# Patient Record
Sex: Female | Born: 1940 | Race: Black or African American | Hispanic: No | Smoking: Former smoker
Health system: Southern US, Community
[De-identification: ages and names within clinical notes are randomized; demographics above are authoritative.]

## PROBLEM LIST (undated history)

## (undated) DIAGNOSIS — Z86718 Personal history of other venous thrombosis and embolism: Secondary | ICD-10-CM

## (undated) DIAGNOSIS — F329 Major depressive disorder, single episode, unspecified: Secondary | ICD-10-CM

## (undated) DIAGNOSIS — M419 Scoliosis, unspecified: Secondary | ICD-10-CM

## (undated) DIAGNOSIS — E785 Hyperlipidemia, unspecified: Secondary | ICD-10-CM

## (undated) DIAGNOSIS — F32A Depression, unspecified: Secondary | ICD-10-CM

## (undated) DIAGNOSIS — M549 Dorsalgia, unspecified: Secondary | ICD-10-CM

## (undated) DIAGNOSIS — Z86711 Personal history of pulmonary embolism: Secondary | ICD-10-CM

## (undated) DIAGNOSIS — G4730 Sleep apnea, unspecified: Secondary | ICD-10-CM

## (undated) DIAGNOSIS — Z8673 Personal history of transient ischemic attack (TIA), and cerebral infarction without residual deficits: Secondary | ICD-10-CM

## (undated) DIAGNOSIS — I639 Cerebral infarction, unspecified: Secondary | ICD-10-CM

## (undated) DIAGNOSIS — J45909 Unspecified asthma, uncomplicated: Secondary | ICD-10-CM

## (undated) DIAGNOSIS — G8929 Other chronic pain: Secondary | ICD-10-CM

## (undated) DIAGNOSIS — M1990 Unspecified osteoarthritis, unspecified site: Secondary | ICD-10-CM

## (undated) DIAGNOSIS — I10 Essential (primary) hypertension: Secondary | ICD-10-CM

## (undated) HISTORY — DX: Personal history of other venous thrombosis and embolism: Z86.718

## (undated) HISTORY — DX: Hyperlipidemia, unspecified: E78.5

## (undated) HISTORY — DX: Personal history of pulmonary embolism: Z86.711

## (undated) HISTORY — DX: Scoliosis, unspecified: M41.9

## (undated) HISTORY — DX: Personal history of transient ischemic attack (TIA), and cerebral infarction without residual deficits: Z86.73

## (undated) NOTE — *Deleted (*Deleted)
Speech Language Pathology Daily Session Note  Patient Details  Name: [name] MRN: [id number] Date of Birth: Dec 30, 1940  {chl ip rehab slp time calculations:[id number]}  Short Term Goals: Week 2: SLP Short Term Goal 1 (Week 2): Pt will utilize external memory aids to facilitate recall of daily information with min assist. SLP Short Term Goal 2 (Week 2): Pt will sustain attention to functional tasks with Min A cues for redirection. SLP Short Term Goal 3 (Week 2): Pt will demonstrate ability to problem solve basic functional situations with Mod A verbal/visual cues. SLP Short Term Goal 4 (Week 2): Pt will initiate in basic tasks with Mod A multimodal cues.  Skilled Therapeutic Interventions: Pt was seen for skilled ST targeting       Pain    Therapy/Group: Individual Therapy  Little Ishikawa 05/24/2020, 7:24 AM

---

## 1968-08-12 HISTORY — PX: OOPHORECTOMY: SHX86

## 1981-08-12 HISTORY — PX: TUBAL LIGATION: SHX77

## 1997-12-28 ENCOUNTER — Ambulatory Visit (HOSPITAL_COMMUNITY): Admission: RE | Admit: 1997-12-28 | Discharge: 1997-12-28 | Payer: Self-pay | Admitting: Obstetrics

## 1998-02-08 ENCOUNTER — Ambulatory Visit (HOSPITAL_COMMUNITY): Admission: RE | Admit: 1998-02-08 | Discharge: 1998-02-08 | Payer: Self-pay | Admitting: *Deleted

## 1998-03-27 ENCOUNTER — Encounter: Payer: Self-pay | Admitting: *Deleted

## 1998-03-27 ENCOUNTER — Ambulatory Visit (HOSPITAL_COMMUNITY): Admission: RE | Admit: 1998-03-27 | Discharge: 1998-03-27 | Payer: Self-pay | Admitting: *Deleted

## 1998-05-24 ENCOUNTER — Ambulatory Visit (HOSPITAL_COMMUNITY): Admission: RE | Admit: 1998-05-24 | Discharge: 1998-05-24 | Payer: Self-pay | Admitting: *Deleted

## 1998-08-18 ENCOUNTER — Ambulatory Visit (HOSPITAL_COMMUNITY): Admission: RE | Admit: 1998-08-18 | Discharge: 1998-08-18 | Payer: Self-pay | Admitting: *Deleted

## 1998-08-18 ENCOUNTER — Encounter: Payer: Self-pay | Admitting: *Deleted

## 1998-09-13 ENCOUNTER — Ambulatory Visit (HOSPITAL_COMMUNITY): Admission: RE | Admit: 1998-09-13 | Discharge: 1998-09-13 | Payer: Self-pay | Admitting: *Deleted

## 1998-09-29 ENCOUNTER — Ambulatory Visit (HOSPITAL_COMMUNITY): Admission: RE | Admit: 1998-09-29 | Discharge: 1998-09-29 | Payer: Self-pay | Admitting: Urology

## 1998-09-29 ENCOUNTER — Encounter: Payer: Self-pay | Admitting: Urology

## 1998-10-13 ENCOUNTER — Ambulatory Visit (HOSPITAL_BASED_OUTPATIENT_CLINIC_OR_DEPARTMENT_OTHER): Admission: RE | Admit: 1998-10-13 | Discharge: 1998-10-13 | Payer: Self-pay | Admitting: Urology

## 2000-07-19 ENCOUNTER — Inpatient Hospital Stay (HOSPITAL_COMMUNITY): Admission: EM | Admit: 2000-07-19 | Discharge: 2000-07-23 | Payer: Self-pay | Admitting: *Deleted

## 2000-07-19 ENCOUNTER — Encounter: Payer: Self-pay | Admitting: *Deleted

## 2000-07-21 ENCOUNTER — Encounter: Payer: Self-pay | Admitting: Neurology

## 2000-12-22 ENCOUNTER — Encounter: Admission: RE | Admit: 2000-12-22 | Discharge: 2000-12-22 | Payer: Self-pay | Admitting: Sports Medicine

## 2000-12-22 ENCOUNTER — Encounter: Payer: Self-pay | Admitting: Sports Medicine

## 2001-02-20 ENCOUNTER — Encounter: Admission: RE | Admit: 2001-02-20 | Discharge: 2001-05-21 | Payer: Self-pay | Admitting: Sports Medicine

## 2001-06-14 ENCOUNTER — Encounter: Payer: Self-pay | Admitting: *Deleted

## 2001-06-14 ENCOUNTER — Emergency Department (HOSPITAL_COMMUNITY): Admission: EM | Admit: 2001-06-14 | Discharge: 2001-06-14 | Payer: Self-pay | Admitting: *Deleted

## 2002-02-25 ENCOUNTER — Encounter: Payer: Self-pay | Admitting: *Deleted

## 2002-02-25 ENCOUNTER — Ambulatory Visit (HOSPITAL_COMMUNITY): Admission: RE | Admit: 2002-02-25 | Discharge: 2002-02-25 | Payer: Self-pay | Admitting: *Deleted

## 2002-05-24 ENCOUNTER — Encounter: Payer: Self-pay | Admitting: *Deleted

## 2002-05-24 ENCOUNTER — Encounter: Admission: RE | Admit: 2002-05-24 | Discharge: 2002-05-24 | Payer: Self-pay | Admitting: *Deleted

## 2003-04-05 ENCOUNTER — Encounter: Payer: Self-pay | Admitting: *Deleted

## 2003-04-05 ENCOUNTER — Ambulatory Visit (HOSPITAL_COMMUNITY): Admission: RE | Admit: 2003-04-05 | Discharge: 2003-04-05 | Payer: Self-pay | Admitting: *Deleted

## 2003-11-29 ENCOUNTER — Ambulatory Visit (HOSPITAL_COMMUNITY): Admission: RE | Admit: 2003-11-29 | Discharge: 2003-11-29 | Payer: Self-pay | Admitting: Sports Medicine

## 2004-02-03 HISTORY — PX: ARTHROSCOPIC REPAIR ACL: SUR80

## 2004-04-09 ENCOUNTER — Encounter: Admission: RE | Admit: 2004-04-09 | Discharge: 2004-04-09 | Payer: Self-pay | Admitting: Orthopedic Surgery

## 2004-07-06 ENCOUNTER — Encounter: Admission: RE | Admit: 2004-07-06 | Discharge: 2004-07-06 | Payer: Self-pay | Admitting: Internal Medicine

## 2004-09-06 ENCOUNTER — Encounter: Admission: RE | Admit: 2004-09-06 | Discharge: 2004-10-10 | Payer: Self-pay | Admitting: Internal Medicine

## 2004-10-04 ENCOUNTER — Encounter: Admission: RE | Admit: 2004-10-04 | Discharge: 2004-10-04 | Payer: Self-pay | Admitting: Orthopedic Surgery

## 2004-11-20 ENCOUNTER — Encounter: Admission: RE | Admit: 2004-11-20 | Discharge: 2004-11-20 | Payer: Self-pay | Admitting: Orthopedic Surgery

## 2004-11-27 ENCOUNTER — Encounter: Admission: RE | Admit: 2004-11-27 | Discharge: 2005-02-25 | Payer: Self-pay | Admitting: Internal Medicine

## 2005-03-04 ENCOUNTER — Encounter: Admission: RE | Admit: 2005-03-04 | Discharge: 2005-03-04 | Payer: Self-pay | Admitting: Orthopedic Surgery

## 2005-03-25 ENCOUNTER — Encounter: Admission: RE | Admit: 2005-03-25 | Discharge: 2005-03-25 | Payer: Self-pay | Admitting: Orthopedic Surgery

## 2005-04-09 ENCOUNTER — Encounter: Admission: RE | Admit: 2005-04-09 | Discharge: 2005-04-09 | Payer: Self-pay | Admitting: Orthopedic Surgery

## 2005-04-26 ENCOUNTER — Encounter: Admission: RE | Admit: 2005-04-26 | Discharge: 2005-04-26 | Payer: Self-pay | Admitting: Orthopedic Surgery

## 2005-08-21 ENCOUNTER — Encounter: Admission: RE | Admit: 2005-08-21 | Discharge: 2005-08-21 | Payer: Self-pay | Admitting: Internal Medicine

## 2005-11-05 ENCOUNTER — Encounter: Admission: RE | Admit: 2005-11-05 | Discharge: 2005-11-05 | Payer: Self-pay | Admitting: Orthopedic Surgery

## 2005-11-26 ENCOUNTER — Encounter: Admission: RE | Admit: 2005-11-26 | Discharge: 2005-11-26 | Payer: Self-pay | Admitting: Orthopedic Surgery

## 2005-12-10 ENCOUNTER — Encounter: Admission: RE | Admit: 2005-12-10 | Discharge: 2005-12-10 | Payer: Self-pay | Admitting: Orthopedic Surgery

## 2006-01-01 ENCOUNTER — Ambulatory Visit (HOSPITAL_COMMUNITY): Admission: RE | Admit: 2006-01-01 | Discharge: 2006-01-01 | Payer: Self-pay | Admitting: Neurological Surgery

## 2006-03-31 ENCOUNTER — Inpatient Hospital Stay (HOSPITAL_COMMUNITY): Admission: RE | Admit: 2006-03-31 | Discharge: 2006-04-04 | Payer: Self-pay | Admitting: Neurological Surgery

## 2006-03-31 HISTORY — PX: SPINE SURGERY: SHX786

## 2006-04-04 ENCOUNTER — Inpatient Hospital Stay (HOSPITAL_COMMUNITY)
Admission: RE | Admit: 2006-04-04 | Discharge: 2006-04-15 | Payer: Self-pay | Admitting: Physical Medicine & Rehabilitation

## 2006-04-04 ENCOUNTER — Ambulatory Visit: Payer: Self-pay | Admitting: Physical Medicine & Rehabilitation

## 2007-01-09 ENCOUNTER — Encounter: Admission: RE | Admit: 2007-01-09 | Discharge: 2007-01-09 | Payer: Self-pay | Admitting: Internal Medicine

## 2008-03-01 ENCOUNTER — Encounter: Admission: RE | Admit: 2008-03-01 | Discharge: 2008-03-01 | Payer: Self-pay | Admitting: Internal Medicine

## 2008-06-30 ENCOUNTER — Emergency Department (HOSPITAL_COMMUNITY): Admission: EM | Admit: 2008-06-30 | Discharge: 2008-06-30 | Payer: Self-pay | Admitting: Emergency Medicine

## 2008-10-14 ENCOUNTER — Encounter
Admission: RE | Admit: 2008-10-14 | Discharge: 2009-01-12 | Payer: Self-pay | Admitting: Physical Medicine & Rehabilitation

## 2008-10-17 ENCOUNTER — Ambulatory Visit: Payer: Self-pay | Admitting: Physical Medicine & Rehabilitation

## 2008-11-24 ENCOUNTER — Ambulatory Visit: Payer: Self-pay | Admitting: Physical Medicine & Rehabilitation

## 2008-12-22 ENCOUNTER — Ambulatory Visit: Payer: Self-pay | Admitting: Physical Medicine & Rehabilitation

## 2009-01-26 ENCOUNTER — Encounter
Admission: RE | Admit: 2009-01-26 | Discharge: 2009-02-14 | Payer: Self-pay | Admitting: Physical Medicine & Rehabilitation

## 2009-01-30 ENCOUNTER — Ambulatory Visit: Payer: Self-pay | Admitting: Physical Medicine & Rehabilitation

## 2009-02-14 ENCOUNTER — Ambulatory Visit: Payer: Self-pay | Admitting: Physical Medicine & Rehabilitation

## 2009-04-18 ENCOUNTER — Ambulatory Visit: Payer: Self-pay | Admitting: Physical Medicine & Rehabilitation

## 2009-04-18 ENCOUNTER — Encounter
Admission: RE | Admit: 2009-04-18 | Discharge: 2009-06-30 | Payer: Self-pay | Admitting: Physical Medicine & Rehabilitation

## 2009-05-03 ENCOUNTER — Encounter: Admission: RE | Admit: 2009-05-03 | Discharge: 2009-05-03 | Payer: Self-pay | Admitting: Internal Medicine

## 2009-05-17 ENCOUNTER — Ambulatory Visit: Payer: Self-pay | Admitting: Physical Medicine & Rehabilitation

## 2009-06-16 ENCOUNTER — Ambulatory Visit: Payer: Self-pay | Admitting: Physical Medicine & Rehabilitation

## 2009-06-30 ENCOUNTER — Encounter
Admission: RE | Admit: 2009-06-30 | Discharge: 2009-08-10 | Payer: Self-pay | Admitting: Physical Medicine & Rehabilitation

## 2009-07-12 ENCOUNTER — Ambulatory Visit: Payer: Self-pay | Admitting: Physical Medicine & Rehabilitation

## 2009-08-10 ENCOUNTER — Ambulatory Visit: Payer: Self-pay | Admitting: Physical Medicine & Rehabilitation

## 2009-09-05 ENCOUNTER — Encounter
Admission: RE | Admit: 2009-09-05 | Discharge: 2009-12-04 | Payer: Self-pay | Admitting: Physical Medicine & Rehabilitation

## 2009-09-13 ENCOUNTER — Ambulatory Visit: Payer: Self-pay | Admitting: Physical Medicine & Rehabilitation

## 2009-10-11 ENCOUNTER — Ambulatory Visit: Payer: Self-pay | Admitting: Physical Medicine & Rehabilitation

## 2009-11-28 ENCOUNTER — Ambulatory Visit: Payer: Self-pay | Admitting: Physical Medicine & Rehabilitation

## 2009-12-20 ENCOUNTER — Encounter
Admission: RE | Admit: 2009-12-20 | Discharge: 2010-03-20 | Payer: Self-pay | Source: Home / Self Care | Admitting: Physical Medicine & Rehabilitation

## 2009-12-27 ENCOUNTER — Ambulatory Visit: Payer: Self-pay | Admitting: Physical Medicine & Rehabilitation

## 2010-01-24 ENCOUNTER — Ambulatory Visit: Payer: Self-pay | Admitting: Physical Medicine & Rehabilitation

## 2010-03-05 ENCOUNTER — Ambulatory Visit: Payer: Self-pay | Admitting: Physical Medicine & Rehabilitation

## 2010-03-28 ENCOUNTER — Encounter
Admission: RE | Admit: 2010-03-28 | Discharge: 2010-06-26 | Payer: Self-pay | Admitting: Physical Medicine & Rehabilitation

## 2010-04-04 ENCOUNTER — Ambulatory Visit: Payer: Self-pay | Admitting: Physical Medicine & Rehabilitation

## 2010-05-02 ENCOUNTER — Ambulatory Visit: Payer: Self-pay | Admitting: Physical Medicine & Rehabilitation

## 2010-05-29 ENCOUNTER — Ambulatory Visit: Payer: Self-pay | Admitting: Physical Medicine & Rehabilitation

## 2010-05-29 ENCOUNTER — Ambulatory Visit (HOSPITAL_COMMUNITY)
Admission: RE | Admit: 2010-05-29 | Discharge: 2010-05-29 | Payer: Self-pay | Admitting: Physical Medicine & Rehabilitation

## 2010-06-15 ENCOUNTER — Encounter: Admission: RE | Admit: 2010-06-15 | Discharge: 2010-06-15 | Payer: Self-pay | Admitting: Internal Medicine

## 2010-06-28 ENCOUNTER — Encounter
Admission: RE | Admit: 2010-06-28 | Discharge: 2010-08-07 | Payer: Self-pay | Source: Home / Self Care | Attending: Physical Medicine & Rehabilitation | Admitting: Physical Medicine & Rehabilitation

## 2010-07-03 ENCOUNTER — Ambulatory Visit: Payer: Self-pay | Admitting: Physical Medicine & Rehabilitation

## 2010-08-01 ENCOUNTER — Ambulatory Visit: Payer: Self-pay | Admitting: Physical Medicine & Rehabilitation

## 2010-08-08 ENCOUNTER — Ambulatory Visit: Payer: Self-pay | Admitting: Physical Medicine & Rehabilitation

## 2010-09-04 ENCOUNTER — Encounter
Admission: RE | Admit: 2010-09-04 | Discharge: 2010-09-05 | Payer: Self-pay | Source: Home / Self Care | Attending: Physical Medicine & Rehabilitation | Admitting: Physical Medicine & Rehabilitation

## 2010-09-05 ENCOUNTER — Ambulatory Visit
Admission: RE | Admit: 2010-09-05 | Discharge: 2010-09-05 | Payer: Self-pay | Source: Home / Self Care | Attending: Physical Medicine & Rehabilitation | Admitting: Physical Medicine & Rehabilitation

## 2010-10-02 ENCOUNTER — Ambulatory Visit: Payer: Medicare Other | Admitting: Physical Medicine & Rehabilitation

## 2010-10-02 ENCOUNTER — Encounter: Payer: Medicare Other | Attending: Physical Medicine & Rehabilitation

## 2010-10-02 DIAGNOSIS — M4180 Other forms of scoliosis, site unspecified: Secondary | ICD-10-CM | POA: Insufficient documentation

## 2010-10-02 DIAGNOSIS — M961 Postlaminectomy syndrome, not elsewhere classified: Secondary | ICD-10-CM

## 2010-10-02 DIAGNOSIS — M4120 Other idiopathic scoliosis, site unspecified: Secondary | ICD-10-CM

## 2010-10-31 ENCOUNTER — Ambulatory Visit: Payer: Medicare Other

## 2010-10-31 ENCOUNTER — Encounter: Payer: Medicare Other | Attending: Physical Medicine & Rehabilitation

## 2010-10-31 DIAGNOSIS — M961 Postlaminectomy syndrome, not elsewhere classified: Secondary | ICD-10-CM | POA: Insufficient documentation

## 2010-10-31 DIAGNOSIS — M4120 Other idiopathic scoliosis, site unspecified: Secondary | ICD-10-CM | POA: Insufficient documentation

## 2010-10-31 DIAGNOSIS — M48061 Spinal stenosis, lumbar region without neurogenic claudication: Secondary | ICD-10-CM | POA: Insufficient documentation

## 2010-10-31 DIAGNOSIS — M533 Sacrococcygeal disorders, not elsewhere classified: Secondary | ICD-10-CM

## 2010-11-02 NOTE — Assessment & Plan Note (Signed)
REASON FOR VISIT:  Back pain.  HISTORY:  A 70 year old female with lumbar postlaminectomy syndrome. She has problems with chronic low back pain.  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  Her pain is well managed with a combination of the following medications, fentanyl patch 25 mcg q.48 h. and hydrocodone 5/325 one p.o. b.i.d. or t.i.d.  Last nursing visit on September 05, 2010, last MD visit on July 03, 2010.  INTERVAL MEDICAL HISTORY:  Negative.  REVIEW OF SYSTEMS:  Denies falls.  No lower extremity pain.  No shooting pains.  Blood pressure 132/43, pulse 52, respirations 18, sat 96% on room air. General, no acute stress.  Mood and affect appropriate.  Her back has no tenderness to palpation except at the right costovertebral angle.  Lower extremity have good strength.  She has 1+ edema bilateral lower extremity pretibial.  Straight leg raising test is negative.  IMPRESSION:  Lumbar postlaminectomy syndrome.  No radicular pain.  PLAN:  Continue current medications listed above.  I will convert her to nurse practitioner visits to be seen in 1 months, nursing visit in 1 month.     Erick Colace, M.D. Electronically Signed    AEK/MedQ D:  10/02/2010 16:23:18  T:  10/02/2010 21:14:21  Job #:  [id number]

## 2010-12-17 ENCOUNTER — Emergency Department (HOSPITAL_COMMUNITY): Payer: Medicare Other

## 2010-12-17 ENCOUNTER — Emergency Department (HOSPITAL_COMMUNITY)
Admission: EM | Admit: 2010-12-17 | Discharge: 2010-12-17 | Disposition: A | Discharge status: Home / Self Care | Payer: Medicare Other | Attending: Emergency Medicine | Admitting: Emergency Medicine

## 2010-12-17 ENCOUNTER — Encounter: Payer: Medicare Other | Admitting: Neurosurgery

## 2010-12-17 DIAGNOSIS — M25569 Pain in unspecified knee: Secondary | ICD-10-CM | POA: Insufficient documentation

## 2010-12-17 DIAGNOSIS — M79609 Pain in unspecified limb: Secondary | ICD-10-CM

## 2010-12-17 DIAGNOSIS — E7800 Pure hypercholesterolemia, unspecified: Secondary | ICD-10-CM | POA: Insufficient documentation

## 2010-12-17 DIAGNOSIS — M7120 Synovial cyst of popliteal space [Baker], unspecified knee: Secondary | ICD-10-CM | POA: Insufficient documentation

## 2010-12-17 DIAGNOSIS — M1710 Unilateral primary osteoarthritis, unspecified knee: Secondary | ICD-10-CM | POA: Insufficient documentation

## 2010-12-25 NOTE — Procedures (Signed)
NAME:  [name], [name] NO.:  [id number]   MEDICAL RECORD NO.:  [id number]           PATIENT TYPE:   LOCATION:                                 FACILITY:   PHYSICIAN:  Erick Colace, M.D.DATE OF BIRTH:  05/08/1941   DATE OF PROCEDURE:  12/22/2008  DATE OF DISCHARGE:                               OPERATIVE REPORT   This is bilateral sacroiliac injections.   INDICATION:  Sacroiliac distribution pain.  She had relief going from 8-  10 pain to 0/10 pain last month, but duration was only about a month,  therefore, repeating.   Informed consent was obtained after describing risks and benefits of  procedure with the patient.  These include bleeding, bruising,  infection.  She elects to proceed and has given written consent.  Pain  does interfere with self-care mobility.  This is rated at a 7/10 level.   The patient placed prone on fluoroscopy table.  Betadine prep, sterile  drape.  A 25-gauge 1-1/2-inch needle was used to anesthetize the skin  and subcu tissue 1% lidocaine x2 mL at each site.  Then, a 25-gauge 3-  inch spinal needle was inserted under fluoroscopic guidance first  targeting the right SI joint.  AP, lateral, and oblique images utilized.  Omnipaque 180 under live fluoro x0.5 mL demonstrate no intravascular  uptake, good joint arthrogram.  Followed by injection of 0.5 mL of 40  mg/mL of Depo-Medrol and 1 mL of 2% MPF lidocaine.  Same procedure  repeated on the right side using the same needle, injectate, and  technique.  The patient tolerated the procedure well.  Pre- and post-  injection vitals stable.  Post-injection instructions given.  Pre-  injection pain level 9/10.  Post injection 6/10 given less than 50%  relief.  We will proceed with caudal epidural given that she does have  some lower extremity radicular discomfort as well.      Erick Colace, M.D.  Electronically Signed     AEK/MEDQ  D:  12/22/2008 09:57:13  T:  12/22/2008  22:46:50  Job:  [id number]   cc:   Stefani Dama, M.D.  Fax: [phone]

## 2010-12-25 NOTE — Procedures (Signed)
NAME:  [name], [name] NO.:  [id number]   MEDICAL RECORD NO.:  [id number]           PATIENT TYPE:   LOCATION:                                 FACILITY:   PHYSICIAN:  Erick Colace, M.D.DATE OF BIRTH:  1941-01-18   DATE OF PROCEDURE:  11/24/2008  DATE OF DISCHARGE:                               OPERATIVE REPORT   PROCEDURE:  Bilateral sacroiliac injection under fluoroscopic guidance.   INDICATIONS:  Back pain post-laminectomy syndrome, history of fusion.  The pain is only partially responsive to medication management and other  conservative care.  She has pedicle screws from L1 through S1.   Informed consent was obtained after describing risks and benefits of  procedure with the patient.  These include bleeding, bruising, and  infection.  She elects to proceed and has given written consent.   The patient placed prone on fluoroscopy table.  Betadine prep, sterile  drape, a 25-gauge 1-1/2-inch needle was used to anesthetize the skin and  subcu tissue 1% lidocaine x2 mL.  Then, a 25-gauge 3-inch spinal needle  was inserted under fluoroscopic guidance targeting the left SI joint.  AP, lateral, and oblique images utilized.  Omnipaque 180 x0.5 mL  demonstrated no intravascular uptake, good arthrogram followed by  injection of 0.5 mL of 40 mg/mL of Depo-Medrol, 1 mL of 2% MPF  lidocaine.  The same procedure was repeated on the right side using same  needle injectate and technique.  The patient tolerated the procedure  well.  Pre- and post-injection vitals stable.  Post-injection  instructions given.   Pre injection pain level is 8/10.  Post-injection pain level is 0/10.  We will follow up 1 month possible reinjection if pain recurs by that  time.      Erick Colace, M.D.  Electronically Signed     AEK/MEDQ  D:  11/24/2008 10:47:32  T:  11/25/2008 01:59:51  Job:  [id number]   cc:   Stefani Dama, M.D.  Fax: [phone]

## 2010-12-25 NOTE — Assessment & Plan Note (Signed)
Referral from Dr. Danielle Dess for the evaluation of chronic low back pain.   A 70 year old female with degenerative lumbar scoliosis and stenosis.  She underwent surgical decompression of her lumbar spine in August 2007.  She has had chronic pain responsive to hydrocodone but having reduction  in terms of duration of effect of the short-acting medication.  She uses  lumbar corset.  She has been on hydrocodone 7.5/325 q.i.d.   The patient's main complaint is back pain described as sharp and aching,  currently as 0, but averaging 8, interferes with activity at 8/10 level.  Sleep is fair.  Pain is worse with walking, improves with rest and  medication, relief from meds is good.  She could walk 10 minutes at a  time.  She does not climb steps.  She does drive.  She uses cane.  She  has been on disability since 1990 per her report.  She needs assistance,  meal prep, household duties, and shopping.   REVIEW OF SYSTEMS:  Positive trouble walking, anxiety.   PAST SURGICAL HISTORY:  Tubal ligation 1983, ovariectomy 1970 on the  right, arthroscopy right knee 2005.   SOCIAL HISTORY:  Married, lives with her husband.  Denies any drug or  alcohol use.  No smoking.   FAMILY HISTORY:  Diabetes, high blood pressure.   MEDICATIONS:  1. Aspirin 81 mg a day.  2. Arthrotec 75 b.i.d.  3. Vicodin 7.5/325 q.i.d.  4. Aciphex 20 mg per day.  5. Potassium 10 mEq p.o. daily.  6. Fosamax 70 mg p.o. q. week.  7. Allegra 180 mg p.o. daily.  8. Lipitor 20 mg daily.  9. Amlodipine 10 mg p.o. daily.  10.Proventil 2 puffs p.r.n.  11.Xanax 0.5 daily.  12.Vitamin C 1000 mg daily.  13.Centrum Silver daily.   Oswestry disability score of 64%.   Opioid risk tool 1, which puts her at a low risk for opioid misuse.   ALLERGIES:  To CODEINE causing itching.   PHYSICAL EXAMINATION:  GENERAL:  Obese female in no acute stress.  Orientation x3.  Affect alert.  Gait is with a limp.  EXTREMITIES:  Without edema.   Coordination normal in the upper and lower  extremities.  Deep tendon reflexes are reduced.  Bilateral lower  extremities 1+ at the knees and ankles, 2+ at the biceps, triceps,  brachioradialis.  Sensation is intact.  Her lumbar spine range of motion  is 0 to 25% of extension, and goes to 50% flexion.  Upper and lower  extremity strength is 5/5.   IMPRESSION:  Lumbar post laminectomy syndrome with chronic postoperative  pain.  Her pain is mainly axial, may be referable to her lumbar facet  versus multifactorial.   PLAN:  We will check urine drug screen today.  She has 55 hydrocodone  left which should last her until the point we get the results back, we  could call in some additional, at least to get her started and then  consider switching over to MS Contin 15 mg b.i.d.  If everything checks  out on the UDS, would recommend aquatic therapy for overall conditioning  and enhancing mobility and function.   He may benefit from facet medial branch blocks although the patient is  somewhat reluctant about considering this option at this point.   Give her some further information on that.      Erick Colace, M.D.  Electronically Signed     AEK/MedQ  D:  10/17/2008 17:06:57  T:  10/18/2008  04:37:01  Job #:  [id number]   cc:   Stefani Dama, M.D.  Fax: [phone]

## 2010-12-25 NOTE — Procedures (Signed)
NAME:  [name], [name] NO.:  [id number]   MEDICAL RECORD NO.:  [id number]          PATIENT TYPE:  REC   LOCATION:  [facility]                          FACILITY:  [facility]   PHYSICIAN:  Erick Colace, M.D.DATE OF BIRTH:  [birth year]   DATE OF PROCEDURE:  DATE OF DISCHARGE:                               OPERATIVE REPORT   PROCEDURE:  This is a caudal epidural steroid injection under  fluoroscopic guidance.   INDICATIONS:  Radiculitis, lumbar post laminectomy syndrome.   Informed consent was obtained after describing risks and benefits of the  procedure with the patient.  These include bleeding, bruising,  infection, temporary permanent paralysis.  She elects proceed and has  given written consent.  Her pain is only partially responsive to  medication management and interferes with self-care mobility.   The patient was placed prone on fluoroscopy table.  Betadine prep,  sterile drape, 25-gauge inch and a half needle was used to anesthetize  the skin and subcu tissue.  1% lidocaine x5 mL, then a 22-gauge 3-1/2-  inch spinal needle was inserted under fluoroscopic guidance into the  sacral hiatus.  AP and lateral images utilized.  Omnipaque 180  demonstrated good spread of contrast material cephalad toward S1  followed by injection of 2 mL of 40 mg/mL Depo-Medrol and 2 mL of 1% MPF  lidocaine.  The patient tolerated the procedure well.  Pre and post  injection vitals stable.  Post-injection instructions given.  I will see  her back for repeat injection when this wears off.  I will see her back  in followup first.      Erick Colace, M.D.  Electronically Signed     AEK/MEDQ  D:  01/30/2009 10:10:51  T:  01/31/2009 00:27:31  Job:  [id number]   cc:   Stefani Dama, M.D.  Fax: [phone]

## 2010-12-27 ENCOUNTER — Encounter: Payer: Medicare Other | Attending: Physical Medicine & Rehabilitation

## 2010-12-27 ENCOUNTER — Ambulatory Visit: Payer: Medicare Other | Admitting: Physical Medicine & Rehabilitation

## 2010-12-27 DIAGNOSIS — M4120 Other idiopathic scoliosis, site unspecified: Secondary | ICD-10-CM | POA: Insufficient documentation

## 2010-12-27 DIAGNOSIS — M961 Postlaminectomy syndrome, not elsewhere classified: Secondary | ICD-10-CM | POA: Insufficient documentation

## 2010-12-27 DIAGNOSIS — M48061 Spinal stenosis, lumbar region without neurogenic claudication: Secondary | ICD-10-CM | POA: Insufficient documentation

## 2010-12-27 DIAGNOSIS — M7120 Synovial cyst of popliteal space [Baker], unspecified knee: Secondary | ICD-10-CM

## 2010-12-28 NOTE — Assessment & Plan Note (Signed)
Ms. Lizaola returns today.  I last saw her in June when I did a caudal  epidural steroid injection.  Her pain went from 6/10 to 0/10, but this  only lasted few days.  Prior to that she also had a good relief from  sacroiliac injection bilaterally on November 24, 2008 and once again on Dec 22, 2008, once again a short-term affect.  Her main complaint is that  she has some activity related pain.  She states that she took seven  Vicodin from her son that he had from a postoperative prescription.  I  cautioned her that this is not only a legal, but violates her controlled  substance agreement.  She understands this and states she would no  longer to this.   Her pain is worse with walking, bending, standing.  Relief from meds is  good.  She can walk 10 minutes at time.  She drives.  She does not climb  steps.  She needs help with meal prep, household duties, and shopping,  but otherwise is independent.  Her review of systems positive for  trouble walking and anxiety.   PHYSICAL EXAMINATION:  VITAL SIGNS:  Her blood pressure is 139/73, pulse  63, respirations 18, O2 sat 97% on room air.  GENERAL:  No acute stress.  Orientation x3.  Affect alert.  Gait is with  a cane.  She has no problems with getting on and off of the office chair  or the exam table with the assistance of a small step stool.  Her back  has tenderness to palpation mainly in the PSIS area bilaterally right  greater than left side.  Her hip range of motion is good.  Lower  extremity strength is good.  Deep tendon reflexes are 1+ bilateral lower  extremities and sensation is mildly reduced.   IMPRESSION:  1. Lumbar post laminectomy syndrome.  2. Sacroiliac disorder.   PLAN:  1. We will add hydrocodone 5/501 p.o. daily for breakthrough type      pain.  2. Continue fentanyl patch 25 mcg q.48 h.  3. Urine drug screen next visit.  Nursing to see her 4 weeks.  I will      see her in 3 months.      Erick Colace, M.D.  Electronically Signed     AEK/MedQ  D:  04/18/2009 11:37:08  T:  04/19/2009 01:33:43  Job #:  [id number]   cc:   Stefani Dama, M.D.  Fax: [phone]   [name], M.D.  Fax: [phone]
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
## 2010-12-28 NOTE — Procedures (Signed)
NAME:  [name], NIGG NO.:  [id number]  MEDICAL RECORD NO.:  [id number]           PATIENT TYPE:  O  LOCATION:  [facility]                          FACILITY:  [facility]  PHYSICIAN:  Erick Colace, M.D.DATE OF BIRTH:  August 23, 1940  DATE OF PROCEDURE:  12/27/2010 DATE OF DISCHARGE:                              OPERATIVE REPORT  REASON FOR VISIT:  Left-sided knee pain.  Left popliteal cyst aspiration under ultrasound guidance.  A 70 year old female with lumbar post laminectomy syndrome.  She has had pain in the left knee, underwent ED visit, diagnosed with Baker cyst. Set up for orthopedic followup given some Percocet #20.  She has finished these more or less over the last 10 days.  She has taken these instead of her hydrocodone.  Her baseline pain medication, fentanyl 25 q.48, hydrocodone 5/325 b.i.d. or t.i.d.  On examination, fullness in the popliteal area.  IMPRESSION:  Popliteal cyst.  PLAN:  Left popliteal aspiration under ultrasound guidance.  The patient placed in a prone position.  Area pre-scanned with ultrasound.  A 2-cm round cyst identified.  An 18-gauge needle was passed under direct visualization after prepping the area with Betadine, sterile technique utilized.  An 18-gauge needle passed easily into the cyst, aspirated 40 mL of fluid.  Fluid was clear.  Synovial fluid post aspiration ultrasound demonstrated complete collapse of the cyst.  The patient tolerated procedure well.  Postprocedure instructions given.     Erick Colace, M.D. Electronically Signed    AEK/MEDQ  D:  12/27/2010 14:19:49  T:  12/28/2010 00:48:48  Job:  [id number]

## 2010-12-28 NOTE — Discharge Summary (Signed)
NAME:  DESHAWN, SKELLEY NO.:  [id number]   MEDICAL RECORD NO.:  [id number]          PATIENT TYPE:  INP   LOCATION:  3312                         FACILITY:  [facility]   PHYSICIAN:  Stefani Dama, M.D.  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   DISCHARGE AND FINAL DIAGNOSES:  1. 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  2. Acute blood loss anemia.   CONDITION ON DISCHARGE:  Stable.   HOSPITAL COURSE:  Ms. 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 She was taken to the operating  room on March 31, 2006 where she underwent surgical decompression and  arthrodesis from the L1-S1. The surgery itself was prolonged and required  significant use of Cell Saver where 1000 cc of Cell Saver blood was  returned. Blood loss was estimated at 2400 cc. She required subsequent  transfusion as her hemoglobin decreased to 7.7 during the postoperative  period. Gradually the patient's pain tolerance improved. The pain medication  was adjusted so that she became more lucid. She was somewhat confused for a  period of time during the postoperative period. However, it was felt that  the patient would require some inpatient rehabilitation in order to regain  her strength, and she was then transferred to inpatient rehab unit at [facility] on April 04, 2006. At the time of discharge her medications  included Percocet 1-2 every 3 hours as needed for pain control, Tylenol 650  mg p.r.n. for pain control in addition to her routine home medications which  included Prevacid,  Centrum Silver, Caduet, alprazolam, Fosamax, Tums, and  Asmanex.   CONDITION ON DISCHARGE:  Stable.      Stefani Dama, M.D.  Electronically Signed     HJE/MEDQ  D:  05/01/2006  T:  05/02/2006  Job:  [id number]

## 2010-12-28 NOTE — Op Note (Signed)
NAME:  [name] NO.:  [id number]   MEDICAL RECORD NO.:  [id number]          PATIENT TYPE:  INP   LOCATION:  3312                         FACILITY:  [facility]   PHYSICIAN:  Stefani Dama, M.D.  DATE OF BIRTH:  [birth year]   DATE OF PROCEDURE:  03/31/2006  DATE OF DISCHARGE:                                 OPERATIVE REPORT   PREOPERATIVE DIAGNOSIS:  Lumbar spondylosis, scoliosis, stenosis with lumbar  radiculopathy.   POSTOPERATIVE DIAGNOSIS:  Lumbar spondylosis, scoliosis, stenosis with  lumbar radiculopathy.   PROCEDURES:  Lumbar decompression via laminectomy from L1-L5 inclusive,  decompression of the L1, L2, L3, L4, L5 and the S1 nerve roots bilaterally,  segmental fixation with pedicle screws from L1to S1, posterolateral  arthrodesis with local autograft and allograft including Osteocel material   SURGEON:  Dr. Danielle Dess.   FIRST ASSISTANT:  None.   ANESTHESIA:  General endotracheal.   INDICATIONS:  [name] is a 70 year old individual whose had  significant back and bilateral lower extremity pain.  She has had this  problem for several years now and the symptoms have only been becoming  progressively worse such that patient has become last active. After careful  consideration of her options, she is advised regarding surgical  decompression arthrodesis.   DESCRIPTION OF PROCEDURE:  The patient was brought to the operating room  supine on the stretcher and after the smooth induction of general  endotracheal anesthesia, she was turned prone. Her back was then prepped  with DuraPrep and draped in a sterile fashion.  A midline incision was  created in the lower lumbar spine and this was carried down to the  lumbodorsal fascia. The fascia was opened around the prominent spinous  processes that formed the bulk of her scoliosis. The first identifiable  spinous processes were that of L4, L3 and L2. The dissection was then  carried out in a  subperiosteal fashion to expose the spinous process of L1  superiorly to the region of the pars and the facette joints at T12-L1.  Dissection was taken out laterally to expose then the transverse processes  of L1, L2, L3, L4 and even L5. Rotatory scoliosis made the dissection  particularly on the right side difficult as this was the side that went  deep. On the left side then, the similar architecture was exposed. It was  noted also at this time that L2 to L4 was fused into a solid block. The  laminectomy was then undertaken from L1-L5 inclusive removing the inferior  margin of the laminas of L1 leaving a superior edge of L1 that was attached  to T12. The facette joints at L1-L2 were completely removed.  Complete  laminectomy of L2, L3, L4 and L5 was then undertaken thus exposing the  common dural tube. This was then carefully with protection of the dura,  however, there was one small dural tear in the dorsal aspect of the dura  underneath the L4 vertebra.  This was closed immediately with a singular  figure-of-eight stitch using 6-0 Prolene. No further dural tears were  encountered.  Once the laminectomy  was completed using a combination of  Leksell rongeur, high-speed bur and Kerrison punches, the dissection was  carried out laterally to expose the individual nerve roots and make sure  that they were decompressed in their travel out the foramen.  This was done  very carefully so as not to harm the nerve roots and once this was  accomplished epidural venous bleeding was controlled with the bipolar  cautery and some small pledgets of Gelfoam soaked in thrombin.  With the  decompression thus being completed, fluoroscopy was brought into the  operative field for visualization of the pedicles and placement of pedicle  screws at the first L1 bilaterally and then at the sacrum bilaterally.  Then  bilateral pedicle screws were placed at L5. At L1, a 6.5 x 50 mm screws were  placed. In the sacrum  6.5 x 50 mm pedicle screws were placed into the  sacrum. At L5, the 6.5 x 55 mm screws were placed then at L4 on the left  side, a 45 mm screw was placed at L3, 55 mm screws were placed bilaterally  and at L2 on the right side a 6.5 x 45 mm screw was placed. With the screws  being placed and confirmed radiographically as best possible due to the  severe angle of the scoliosis measuring 45 degrees in the region of L2-L4,  the patient had rods placed that were contoured in situ across the L1 to S1  segment. The rods were initially placed temporarily and they were finally  tightened into position after a radiographic confirmation of the surgical  construct was obtained. It was noted that the L4 screw on the left side did  require repositioning. This was done without difficulty and a follow-up x-  ray demonstrated that the hardware was in good position amongst all the  vertebra.  Care was then taken to make sure that the nerve roots remained  decompressed.  Once this was ascertained, the dura was covered with a thin  layer of Gelfoam in lateral gutters and then bone material that was prepared  from the autograft that was removed from the laminectomy mixed with 5 mL of  Osteocel was packed into the lateral gutters. Once the bone was completely  packed, care was taken to again check to make sure that the nerve roots were  well decompressed. Then the retractors were removed, the lumbodorsal fascia  was closed in #1 Vicryl interrupted fashion, 2-0 Vicryl was used in the  subcutaneous tissues, 3-0 Vicryl subcuticularly. Surgical staples were  placed in the skin. The patient tolerated procedure well.  Blood loss for  the procedure was estimated at 2400 mL, 1000 mL of Cell Saver blood was  returned to the patient during the case.  The sponge and needle counts were  correct.      Stefani Dama, M.D.  Electronically Signed    HJE/MEDQ  D:  03/31/2006  T:  04/01/2006  Job:  [id number]

## 2011-01-23 ENCOUNTER — Ambulatory Visit: Payer: Medicare Other

## 2011-07-23 ENCOUNTER — Other Ambulatory Visit: Payer: Self-pay | Admitting: Internal Medicine

## 2011-07-23 DIAGNOSIS — Z1231 Encounter for screening mammogram for malignant neoplasm of breast: Secondary | ICD-10-CM

## 2011-08-21 ENCOUNTER — Ambulatory Visit: Payer: Medicare Other

## 2011-08-23 ENCOUNTER — Ambulatory Visit
Admission: RE | Admit: 2011-08-23 | Discharge: 2011-08-23 | Disposition: A | Discharge status: Home / Self Care | Payer: Medicare Other | Source: Ambulatory Visit | Attending: Internal Medicine | Admitting: Internal Medicine

## 2011-08-23 DIAGNOSIS — Z1231 Encounter for screening mammogram for malignant neoplasm of breast: Secondary | ICD-10-CM

## 2012-02-14 ENCOUNTER — Emergency Department (HOSPITAL_COMMUNITY): Payer: Medicare Other

## 2012-02-14 ENCOUNTER — Emergency Department (HOSPITAL_COMMUNITY)
Admission: EM | Admit: 2012-02-14 | Discharge: 2012-02-14 | Disposition: A | Discharge status: Home / Self Care | Payer: Medicare Other | Attending: Emergency Medicine | Admitting: Emergency Medicine

## 2012-02-14 ENCOUNTER — Encounter (HOSPITAL_COMMUNITY): Payer: Self-pay | Admitting: Physical Medicine and Rehabilitation

## 2012-02-14 DIAGNOSIS — R109 Unspecified abdominal pain: Secondary | ICD-10-CM | POA: Insufficient documentation

## 2012-02-14 DIAGNOSIS — Z981 Arthrodesis status: Secondary | ICD-10-CM | POA: Insufficient documentation

## 2012-02-14 DIAGNOSIS — M4120 Other idiopathic scoliosis, site unspecified: Secondary | ICD-10-CM | POA: Insufficient documentation

## 2012-02-14 DIAGNOSIS — I10 Essential (primary) hypertension: Secondary | ICD-10-CM | POA: Insufficient documentation

## 2012-02-14 DIAGNOSIS — G8929 Other chronic pain: Secondary | ICD-10-CM | POA: Insufficient documentation

## 2012-02-14 HISTORY — DX: Other chronic pain: G89.29

## 2012-02-14 HISTORY — DX: Dorsalgia, unspecified: M54.9

## 2012-02-14 HISTORY — DX: Essential (primary) hypertension: I10

## 2012-02-14 LAB — URINE MICROSCOPIC-ADD ON

## 2012-02-14 LAB — URINALYSIS, ROUTINE W REFLEX MICROSCOPIC
Bilirubin Urine: NEGATIVE
Hgb urine dipstick: NEGATIVE
Specific Gravity, Urine: 1.015 (ref 1.005–1.030)
pH: 7.5 (ref 5.0–8.0)

## 2012-02-14 MED ORDER — HYDROMORPHONE HCL PF 1 MG/ML IJ SOLN
1.0000 mg | Freq: Once | INTRAMUSCULAR | Status: AC
Start: 2012-02-14 — End: 2012-02-14
  Administered 2012-02-14: 1 mg via INTRAMUSCULAR
  Filled 2012-02-14: qty 1

## 2012-02-14 NOTE — ED Notes (Addendum)
Pt presents to department for evaluation of L sided abdominal pain. Ongoing since May. Pt states recent MRI ordered by PCP, states she was diagnosed with musculoskeletal pain. Now states pain has become worse, 9/10 at the time, increases with movement. Denies urinary symptoms. No nausea/vomiting. Currently going to pain clinic for chronic back pain.

## 2012-02-14 NOTE — ED Notes (Signed)
Patient transported to X-ray 

## 2012-02-14 NOTE — ED Provider Notes (Signed)
History     CSN: [id number]  Arrival date & time 02/14/12  1001   First MD Initiated Contact with Patient 02/14/12 1056      Chief Complaint  Patient presents with  . Flank Pain  . Back Pain    (Consider location/radiation/quality/duration/timing/severity/associated sxs/prior treatment) HPI Pt presents with c/o left side pain/flank pain which is chronic in nature- states it has been going on for almost 2 months.  Described as a dull ache.  Worse with movement.  No fever, vomiting or other associated systemic symptoms.  Pt has chronic back pain from scoliosis- is under care of pain management clinic- she states they had ordered an MRI of her spine- lying there on table seemed to bring the pain on that she is having now in her left side.   There are no other associated systemic symptoms, there are no other alleviating or modifying factors.   Past Medical History  Diagnosis Date  . Chronic back pain   . Hypertension     No past surgical history on file.  No family history on file.  History  Substance Use Topics  . Smoking status: Never Smoker   . Smokeless tobacco: Not on file  . Alcohol Use: No    OB History    Grav Para Term Preterm Abortions TAB SAB Ect Mult Living                  Review of Systems ROS reviewed and all otherwise negative except for mentioned in HPI Allergies  Codeine  Home Medications   Current Outpatient Rx  Name Route Sig Dispense Refill  . ALENDRONATE SODIUM 70 MG PO TABS Oral Take 70 mg by mouth every 7 (seven) days. Take on Sundays. Take with a full glass of water on an empty stomach.    . AMLODIPINE BESYLATE 5 MG PO TABS Oral Take 5 mg by mouth daily.    . ASPIRIN EC 81 MG PO TBEC Oral Take 81 mg by mouth daily.    . ATORVASTATIN CALCIUM 20 MG PO TABS Oral Take 20 mg by mouth daily.    Marland Kitchen DICLOFENAC EPOLAMINE 1.3 % TD PTCH Transdermal Place 1 patch onto the skin 2 (two) times daily.    . DONEPEZIL HCL 5 MG PO TABS Oral Take 5 mg by mouth  daily.    . FENTANYL 50 MCG/HR TD PT72 Transdermal Place 1 patch onto the skin every 3 (three) days.    Marland Kitchen FEXOFENADINE HCL 180 MG PO TABS Oral Take 180 mg by mouth daily as needed. For allergies.    . FUROSEMIDE 40 MG PO TABS Oral Take 40 mg by mouth daily as needed. For swelling.    Marland Kitchen HYDROCODONE-ACETAMINOPHEN 10-325 MG PO TABS Oral Take 1 tablet by mouth every 6 (six) hours as needed. For pain.    Marland Kitchen LISINOPRIL 10 MG PO TABS Oral Take 10 mg by mouth daily.    Marland Kitchen METAXALONE 800 MG PO TABS Oral Take 800 mg by mouth 3 (three) times daily.    Marland Kitchen POTASSIUM CHLORIDE ER 10 MEQ PO TBCR Oral Take 10 mEq by mouth 2 (two) times daily.    Marland Kitchen PREDNISOLONE ACETATE 1 % OP SUSP Left Eye Place 1 drop into the left eye 3 (three) times daily.      BP 167/72  Pulse 56  Temp 98.7 F (37.1 C) (Oral)  Resp 18  SpO2 98% Vitals reviewed Physical Exam Physical Examination: General appearance - alert, well appearing, and  in no distress Mental status - alert, oriented to person, place, and time Mouth - mucous membranes moist, pharynx normal without lesions Chest - clear to auscultation, no wheezes, rales or rhonchi, symmetric air entry, ttp over left ribs at mid axillary line, no crepitus or stepoffs Heart - normal rate, regular rhythm, normal S1, S2, no murmurs, rubs, clicks or gallops Abdomen - soft, nontender, nondistended, no masses or organomegaly Back exam - full range of motion, palpable scoliosis, no midline c/t/l tenderness Musculoskeletal - no joint tenderness, deformity or swelling Extremities - peripheral pulses normal, no pedal edema, no clubbing or cyanosis Skin - normal coloration and turgor, no rashes  ED Course  Procedures (including critical care time)  Labs Reviewed  URINALYSIS, ROUTINE W REFLEX MICROSCOPIC - Abnormal; Notable for the following:    APPearance TURBID (*)     Leukocytes, UA TRACE (*)     All other components within normal limits  URINE MICROSCOPIC-ADD ON  LAB REPORT -  SCANNED   Dg Ribs Unilateral W/chest Left  02/14/2012  *RADIOLOGY REPORT*  Clinical Data: Lateral rib pain  LEFT RIBS AND CHEST - 3+ VIEW  Comparison: 03/27/2006  Findings: Heart size is normal.  The aorta shows calcification and unfolding.  The lungs are clear.  No pneumothorax or hemothorax. Left-sided rib detail films do not show any evidence of fracture or other focal lesion.  Spinal scoliosis with lumbar fusion.  IMPRESSION: No left rib abnormalities identified.  Original Report Authenticated By: Thomasenia Sales, M.D.     1. Left flank pain       MDM  Pt with hx of chronic back pain under care of pain management clinic presents with left rib pain which has been ongoing over 1 months.  Xray reassuring, no UTI.  Long discussion with patient about management of chronic pain, she was treated with IM dilaudid in the ED, no rx given due to her pain contract.  She will arrange for follow up with her PMD and pain doctors.  Pt felt improved after dilaudid.  Discharged with strict return precautions.  Pt agreeable with plan.        Ethelda Chick, MD 02/16/12 [phone]

## 2013-07-18 ENCOUNTER — Ambulatory Visit (HOSPITAL_BASED_OUTPATIENT_CLINIC_OR_DEPARTMENT_OTHER): Payer: Medicare Other | Attending: Internal Medicine

## 2013-07-18 VITALS — Ht 65.0 in | Wt 210.0 lb

## 2013-07-18 DIAGNOSIS — G4733 Obstructive sleep apnea (adult) (pediatric): Secondary | ICD-10-CM | POA: Insufficient documentation

## 2013-07-18 DIAGNOSIS — R0609 Other forms of dyspnea: Secondary | ICD-10-CM | POA: Insufficient documentation

## 2013-07-18 DIAGNOSIS — R0989 Other specified symptoms and signs involving the circulatory and respiratory systems: Secondary | ICD-10-CM | POA: Insufficient documentation

## 2013-07-20 ENCOUNTER — Encounter: Payer: Self-pay | Admitting: Podiatry

## 2013-07-20 ENCOUNTER — Ambulatory Visit (INDEPENDENT_AMBULATORY_CARE_PROVIDER_SITE_OTHER): Payer: Medicare Other | Admitting: Podiatry

## 2013-07-20 VITALS — BP 132/67 | HR 56 | Resp 16

## 2013-07-20 DIAGNOSIS — B351 Tinea unguium: Secondary | ICD-10-CM

## 2013-07-20 DIAGNOSIS — M79609 Pain in unspecified limb: Secondary | ICD-10-CM

## 2013-07-21 NOTE — Progress Notes (Signed)
[name] presents today chief complaint of painful ingrown toenails which are thick and hurting her shoe.  Objective: Pulses are palpable nails are thick yellow dystrophic onychomycotic painful palpation as well as debridement.  Assessment: Pain in limb secondary to onychomycosis.  Plan: Debridement of nails 1-5 bilateral.

## 2013-07-25 DIAGNOSIS — G4733 Obstructive sleep apnea (adult) (pediatric): Secondary | ICD-10-CM

## 2013-07-25 NOTE — Sleep Study (Signed)
   NAME: [name] DATE OF BIRTH:  Aug 01, 1941 MEDICAL RECORD NUMBER [id number]  LOCATION: [facility]  PHYSICIAN: Cylas Falzone D  DATE OF STUDY: 07/18/2013  SLEEP STUDY TYPE: Nocturnal Polysomnogram               REFERRING PHYSICIAN: Dorrene German, MD  INDICATION FOR STUDY: Hypersomnia with sleep apnea  EPWORTH SLEEPINESS SCORE:   15/24 HEIGHT: 5\' 5"  (165.1 cm)  WEIGHT: 95.255 kg (210 lb)    Body mass index is 34.95 kg/(m^2).  NECK SIZE: 13 in.  MEDICATIONS: Charted for review  SLEEP ARCHITECTURE: Total sleep time 325 minutes with sleep efficiency 78.8%. Stage I was 0.2%, stage II 88.8%, stage III absent, REM 11.1% of total sleep time. Sleep latency 19.5 minutes, REM latency 168.5 minutes, awake after sleep onset 67.5 minutes. Arousal index 0.4. Bedtime medication: Lithium   RESPIRATORY DATA: Apnea hypopnea index (AHI) 6.5 per hour. A total of 35 events were scored including 11 obstructive apneas, 14 central apneas, 10 hypopneas. All events were associated with supine sleep position. REM AHI 21.7 per hour. There were not enough early events to permit split protocol CPAP titration.  OXYGEN DATA: Loud snoring with oxygen desaturation 20 dear of 80% and mean oxygen saturation through the study of 89% on room air.  CARDIAC DATA: Sinus rhythm with PACs  MOVEMENT/PARASOMNIA: No significant motor disturbance. Bathroom x2.     IMPRESSION/ RECOMMENDATION:   1) Mild obstructive sleep apnea/hypopnea syndrome, AHI 6.5 per hour. Supine events. REM AHI 21.7 per hour. Loud snoring with oxygen desaturation to a nadir of 80% and mean oxygen saturation through the study of 89% on room air. Supplemental oxygen was not provided but should be considered for evaluation in the home environment 2) There were not enough events to permit application of split protocol CPAP titration. Scores in this range may respond to conservative management, including sleep off flat of back,  weight loss and treatment for nasal congestion. The patient can return for dedicated CPAP titration study if appropriate.  Signed Jetty Duhamel M.D. Waymon Budge Diplomate, American Board of Sleep Medicine  ELECTRONICALLY SIGNED ON:  07/25/2013, 3:03 PM [facility] PH: [phone]   FX: [phone] ACCREDITED BY THE AMERICAN ACADEMY OF SLEEP MEDICINE

## 2013-08-26 ENCOUNTER — Ambulatory Visit (HOSPITAL_BASED_OUTPATIENT_CLINIC_OR_DEPARTMENT_OTHER): Payer: Medicare Other | Attending: Internal Medicine | Admitting: Radiology

## 2013-08-26 VITALS — Ht 65.0 in | Wt 210.0 lb

## 2013-08-26 DIAGNOSIS — G4730 Sleep apnea, unspecified: Principal | ICD-10-CM

## 2013-08-26 DIAGNOSIS — G4733 Obstructive sleep apnea (adult) (pediatric): Secondary | ICD-10-CM

## 2013-08-26 DIAGNOSIS — Z6834 Body mass index (BMI) 34.0-34.9, adult: Secondary | ICD-10-CM | POA: Insufficient documentation

## 2013-08-26 DIAGNOSIS — G4710 Hypersomnia, unspecified: Secondary | ICD-10-CM | POA: Insufficient documentation

## 2013-08-28 DIAGNOSIS — G4733 Obstructive sleep apnea (adult) (pediatric): Secondary | ICD-10-CM

## 2013-08-28 NOTE — Sleep Study (Signed)
   NAME: [name] DATE OF BIRTH:  1940-12-25 MEDICAL RECORD NUMBER [id number]  LOCATION: [facility]  PHYSICIAN: YOUNG,CLINTON D  DATE OF STUDY: 08/26/2013  SLEEP STUDY TYPE: Nocturnal Polysomnogram               REFERRING PHYSICIAN: Dorrene German, MD  INDICATION FOR STUDY: Hypersomnia with sleep apnea. A diagnostic polysomnogram on 07/18/2013 recorded AHI of 6.5 per hour with body weight 210 pounds. CPAP titration is requested.  EPWORTH SLEEPINESS SCORE:   13/24 HEIGHT: 5\' 5"  (165.1 cm)  WEIGHT: 210 lb (95.255 kg)    Body mass index is 34.95 kg/(m^2).  NECK SIZE: 14 in.  MEDICATIONS: Charted for review  SLEEP ARCHITECTURE: Total sleep time 411.5 minutes with sleep efficiency 94.3%.  stage I was 3.2%, stage II 94.3%, stage III 0.6%, REM 1.9% of total sleep time. Sleep latency 9.5 minutes, REM latency 89.5 minutes, awake after sleep onset 16 minutes, arousal index 16.6. Bedtime medications: Lithium, Norvasc, Lipitor, Pristiq  RESPIRATORY DATA: CPAP titration protocol. CPAP was titrated to 11 CWP, AHI 9.9 per hour reflecting central apneas. Bilateral titration was then tried up to an inspiratory pressure of 15 and expiratory pressure at 11 CWP with persistence of central apneas. She wore a medium ResMed airfit F10 fullface mask with heated humidifier.  OXYGEN DATA: Snoring was prevented by CPAP with mean oxygen saturation on room air 91.5%.  CARDIAC DATA: Normal sinus rhythm with rare PVC  MOVEMENT/PARASOMNIA: A few limb jerks were noted with little effect on sleep. No bathroom trips.  IMPRESSION/ RECOMMENDATION:   1) CPAP titration to 11 CWP with residual central apneas. A trial of bilevel titration to an inspiratory pressure of 15 and expiratory pressure of 11 did not clear the apneas, leaving a residual of 28 events per hour. Recommend that CPAP be tried for home use at 11 CWP, anticipating best tolerance and satisfactory control of obstructive  apneas. She wore a medium ResMed air fit F10 fullface mask with heated humidifier. Snoring was prevented and mean oxygen saturation help 91.5% on room air. 2) A baseline polysomnogram on 07/18/2013 recorded AHI of 6.5 per hour with body weight 210 pounds.  Signed Clinton Lonn Georgia Diplomate, American Board of Sleep Medicine  ELECTRONICALLY SIGNED ON:  08/28/2013, 10:54 AM [facility] PH: [phone]   FX: [phone] ACCREDITED BY THE AMERICAN ACADEMY OF SLEEP MEDICINE

## 2013-10-05 ENCOUNTER — Ambulatory Visit (INDEPENDENT_AMBULATORY_CARE_PROVIDER_SITE_OTHER): Payer: Medicare Other | Admitting: Podiatry

## 2013-10-05 ENCOUNTER — Encounter: Payer: Self-pay | Admitting: Podiatry

## 2013-10-05 VITALS — BP 129/67 | HR 76 | Resp 16

## 2013-10-05 DIAGNOSIS — B351 Tinea unguium: Secondary | ICD-10-CM

## 2013-10-05 DIAGNOSIS — M79609 Pain in unspecified limb: Secondary | ICD-10-CM

## 2013-10-05 NOTE — Progress Notes (Signed)
He presents today with a chief complaint of painful elongated toenails one through 5 bilateral.  Objective: Vital signs are stable alert and oriented x3. Pulses are palpable. Nails are thick yellow dystrophic lytic mycotic subungual debris and painful palpation as well as debridement.  Assessment: Pain in limb secondary to onychomycosis 1 through 5 bilateral.  Plan: Debridement of nails 1 through 5 bilateral is cover service secondary to pain.

## 2013-12-21 ENCOUNTER — Ambulatory Visit: Payer: 59 | Admitting: Podiatry

## 2014-01-04 ENCOUNTER — Ambulatory Visit: Payer: 59 | Admitting: Podiatry

## 2014-01-17 ENCOUNTER — Institutional Professional Consult (permissible substitution): Payer: 59 | Admitting: Diagnostic Neuroimaging

## 2014-01-24 ENCOUNTER — Ambulatory Visit (INDEPENDENT_AMBULATORY_CARE_PROVIDER_SITE_OTHER): Payer: Medicare Other | Admitting: Diagnostic Neuroimaging

## 2014-01-24 ENCOUNTER — Encounter: Payer: Self-pay | Admitting: Diagnostic Neuroimaging

## 2014-01-24 VITALS — BP 141/69 | HR 56 | Temp 98.3°F | Ht 61.0 in | Wt 215.0 lb

## 2014-01-24 DIAGNOSIS — F411 Generalized anxiety disorder: Secondary | ICD-10-CM

## 2014-01-24 DIAGNOSIS — G894 Chronic pain syndrome: Secondary | ICD-10-CM

## 2014-01-24 DIAGNOSIS — F329 Major depressive disorder, single episode, unspecified: Secondary | ICD-10-CM

## 2014-01-24 DIAGNOSIS — G4733 Obstructive sleep apnea (adult) (pediatric): Secondary | ICD-10-CM

## 2014-01-24 DIAGNOSIS — F3289 Other specified depressive episodes: Secondary | ICD-10-CM

## 2014-01-24 DIAGNOSIS — R413 Other amnesia: Secondary | ICD-10-CM

## 2014-01-24 DIAGNOSIS — F32A Depression, unspecified: Secondary | ICD-10-CM

## 2014-01-24 NOTE — Patient Instructions (Signed)
I will check mri and lab testing.

## 2014-01-24 NOTE — Progress Notes (Signed)
[facility]  PATIENT: [name] DOB: 1941/04/27  REFERRING CLINICIAN: Avbuere HISTORY FROM: patient and son REASON FOR VISIT: new consult    HISTORICAL  CHIEF COMPLAINT:  No chief complaint on file.   HISTORY OF PRESENT ILLNESS:   73 year old left-handed female here for evaluation of memory loss. Since 2013 patient has had progressive short-term memory problems, forgetting recent events, having more trouble paying bills, having trouble cooking and preparing meals. She is primary caregiver for her husband who is in hospice for cancer. Patient and her son have noticed these memory problems. Patient was started on Aricept in 2013, and she's been tolerating this well. It was recently increased from 5 up to 10 mg per day. Patient also struggling with chronic pain, anxiety, depression, sleep apnea. She is seeing psychiatry now and pain management.  REVIEW OF SYSTEMS: Full 14 system review of systems performed and notable only for memory loss confusion depression.  ALLERGIES: Allergies  Allergen Reactions  . Codeine Itching    HOME MEDICATIONS: Outpatient Prescriptions Prior to Visit  Medication Sig Dispense Refill  . alendronate (FOSAMAX) 70 MG tablet Take 70 mg by mouth every 7 (seven) days. Take on Sundays. Take with a full glass of water on an empty stomach.      . ALPRAZolam (XANAX) 0.5 MG tablet Take 0.5 mg by mouth at bedtime as needed for anxiety.      Marland Kitchen amLODipine (NORVASC) 5 MG tablet Take 5 mg by mouth daily.      Marland Kitchen aspirin EC 81 MG tablet Take 325 mg by mouth daily.       Marland Kitchen atorvastatin (LIPITOR) 20 MG tablet Take 20 mg by mouth daily.      Marland Kitchen desvenlafaxine (PRISTIQ) 100 MG 24 hr tablet Take 100 mg by mouth daily.      . fentaNYL (DURAGESIC - DOSED MCG/HR) 50 MCG/HR Place 75 mcg onto the skin every 3 (three) days.       . fexofenadine (ALLEGRA) 180 MG tablet Take 180 mg by mouth daily as needed. For allergies.      . furosemide (LASIX) 40 MG  tablet Take 40 mg by mouth daily as needed. For swelling.      Marland Kitchen lisinopril (PRINIVIL,ZESTRIL) 10 MG tablet Take 10 mg by mouth daily.      Marland Kitchen oxyCODONE (ROXICODONE) 15 MG immediate release tablet Take 15 mg by mouth every 4 (four) hours as needed for pain.      . potassium chloride (KLOR-CON 10) 10 MEQ tablet Take 10 mEq by mouth 2 (two) times daily.      . prednisoLONE acetate (PRED FORTE) 1 % ophthalmic suspension Place 1 drop into the left eye 3 (three) times daily.      Marland Kitchen donepezil (ARICEPT) 5 MG tablet Take 5 mg by mouth daily.       No facility-administered medications prior to visit.    PAST MEDICAL HISTORY: Past Medical History  Diagnosis Date  . Chronic back pain   . Hypertension   . Scoliosis   . Hyperlipemia     PAST SURGICAL HISTORY: Past Surgical History  Procedure Laterality Date  . Oophorectomy  1970  . Tubal ligation  1983  . Arthroscopic repair acl  02/03/2004  . Spine surgery  03/31/2006    FAMILY HISTORY: Family History  Problem Relation Age of Onset  . Other Father     kidney problems    SOCIAL HISTORY:  History   Social History  . Marital  Status: Married    Spouse Name: Chrissie NoaWilliam    Number of Children: 2  . Years of Education: HS   Occupational History  .     Social History Main Topics  . Smoking status: Former Smoker -- 1.00 packs/day for 10 years    Types: Cigarettes    Quit date: 08/12/1973  . Smokeless tobacco: Never Used  . Alcohol Use: No  . Drug Use: No  . Sexual Activity: Not on file   Other Topics Concern  . Not on file   Social History Narrative   Patient lives at home with spouse.   Caffeine Use: occasionally     PHYSICAL EXAM  Filed Vitals:   01/24/14 1004  BP: 141/69  Pulse: 56  Temp: 98.3 F (36.8 C)  TempSrc: Oral  Height: 5\' 1"  (1.549 m)  Weight: 215 lb (97.523 kg)    Not recorded    Body mass index is 40.64 kg/(m^2).  GENERAL EXAM: Patient is in no distress; well developed, nourished and groomed;  neck is supple  CARDIOVASCULAR: Regular rate and rhythm, no murmurs, no carotid bruits  NEUROLOGIC: MENTAL STATUS: awake, alert, oriented to person, place and time, recent and remote memory intact, normal attention and concentration, language fluent, comprehension intact, naming intact, fund of knowledge appropriate; MMSE 24/30 (MISSES 1 FOR SEASON, 4 FOR SERIAL SEVENS, 1 FOR RECALL). AFT 8. GDS 9. NO FRONTAL RELEASE SIGNS. CRANIAL NERVE: no papilledema on fundoscopic exam, pupils equal and reactive to light, visual fields full to confrontation, extraocular muscles intact, no nystagmus, facial sensation and strength symmetric, hearing intact, palate elevates symmetrically, uvula midline, shoulder shrug symmetric, tongue midline. MOTOR: normal bulk and tone, full strength in the BUE, BLE SENSORY: normal and symmetric to light touch, pinprick, temperature, vibration  COORDINATION: finger-nose-finger, fine finger movements normal REFLEXES: deep tendon reflexes present and symmetric GAIT/STATION: narrow based gait; ANTALGIC, SLOW TO RISE. USES SINGLE POINT CANE. UNSTEADY.   DIAGNOSTIC DATA (LABS, IMAGING, TESTING) - I reviewed patient records, labs, notes, testing and imaging myself where available.  No results found for this basename: WBC, HGB, HCT, MCV, PLT   No results found for this basename: na, k, cl, co2, glucose, bun, creatinine, calcium, prot, albumin, ast, alt, alkphos, bilitot, gfrnonaa, gfraa   No results found for this basename: CHOL, HDL, LDLCALC, LDLDIRECT, TRIG, CHOLHDL   No results found for this basename: HGBA1C   No results found for this basename: VITAMINB12   No results found for this basename: TSH      ASSESSMENT AND PLAN  73 y.o. year old female here with memory loss since 2013, worse in last 6 months. Significant contributing factors (depression, anxiety, pain syndrome, pain medications, sleep apnea). Unlikely to represent primary neurodegenerative condition at  this time.  PLAN: - MRI brain and labs to look for other causes of memory loss - continue pain mgmt, psychiatry and sleep disorder treatement - focus on healthy eating, physical activity and social/mental stimulating activities - ok to continue aricept (started by pcp); however would not add on namenda at this time  Orders Placed This Encounter  Procedures  . MR Brain Wo Contrast  . Vitamin B12  . TSH   Return in about 6 months (around 07/26/2014) for with Heide GuileLynn Lam or Breezie Micucci.    Suanne MarkerVIKRAM R. Karesha Trzcinski, MD 01/24/2014, 11:31 AM Certified in Neurology, Neurophysiology and Neuroimaging  [facility] [address], [address] [place], [place] [address] [phone]

## 2014-01-25 ENCOUNTER — Telehealth: Payer: Self-pay | Admitting: *Deleted

## 2014-01-25 LAB — VITAMIN B12: Vitamin B-12: 627 pg/mL (ref 211–946)

## 2014-01-25 LAB — TSH: TSH: 0.224 u[IU]/mL — AB (ref 0.450–4.500)

## 2014-01-25 NOTE — Telephone Encounter (Signed)
Called pt to inform her per Dr. Marjory Lies that pt's lab work results were B12 normal and TSH was low and that pt needed to f/u with PCP. I advised the pt that if she has any other problems, questions or concerns to call the office. Pt verbalized understanding.

## 2014-02-02 ENCOUNTER — Ambulatory Visit
Admission: RE | Admit: 2014-02-02 | Discharge: 2014-02-02 | Disposition: A | Discharge status: Home / Self Care | Payer: Medicare Other | Source: Ambulatory Visit | Attending: Diagnostic Neuroimaging | Admitting: Diagnostic Neuroimaging

## 2014-02-02 DIAGNOSIS — F32A Depression, unspecified: Secondary | ICD-10-CM

## 2014-02-02 DIAGNOSIS — R413 Other amnesia: Secondary | ICD-10-CM

## 2014-02-02 DIAGNOSIS — G4733 Obstructive sleep apnea (adult) (pediatric): Secondary | ICD-10-CM

## 2014-02-02 DIAGNOSIS — G894 Chronic pain syndrome: Secondary | ICD-10-CM

## 2014-02-02 DIAGNOSIS — F411 Generalized anxiety disorder: Secondary | ICD-10-CM

## 2014-02-02 DIAGNOSIS — F329 Major depressive disorder, single episode, unspecified: Secondary | ICD-10-CM

## 2014-02-15 ENCOUNTER — Ambulatory Visit (INDEPENDENT_AMBULATORY_CARE_PROVIDER_SITE_OTHER): Payer: Medicare Other | Admitting: Podiatry

## 2014-02-15 ENCOUNTER — Encounter: Payer: Self-pay | Admitting: Podiatry

## 2014-02-15 DIAGNOSIS — B351 Tinea unguium: Secondary | ICD-10-CM

## 2014-02-15 DIAGNOSIS — M79676 Pain in unspecified toe(s): Secondary | ICD-10-CM

## 2014-02-15 DIAGNOSIS — M79609 Pain in unspecified limb: Secondary | ICD-10-CM

## 2014-02-15 NOTE — Progress Notes (Signed)
   Subjective:    Patient ID: [name], female    DOB: August 26, 1940, 73 y.o.   MRN: [id number]  HPI  Routine debridement, no other issues  Review of Systems     Objective:   Physical Exam: Pulses are strongly palpable bilateral. Nails are thick yellow dystrophic with mycotic and painful palpation.        Assessment & Plan:  Assessment: Pain in limb secondary to onychomycosis 1 through 5 bilateral.  Plan: Debridement of nails 1 through 5 bilateral covered service secondary to pain followup with me in 3 months

## 2014-05-24 ENCOUNTER — Ambulatory Visit: Payer: Medicare Other | Admitting: Podiatry

## 2014-07-13 ENCOUNTER — Other Ambulatory Visit: Payer: Self-pay

## 2014-07-13 DIAGNOSIS — Z1231 Encounter for screening mammogram for malignant neoplasm of breast: Secondary | ICD-10-CM

## 2014-07-26 ENCOUNTER — Ambulatory Visit: Payer: 59 | Admitting: Nurse Practitioner

## 2014-07-26 ENCOUNTER — Ambulatory Visit (INDEPENDENT_AMBULATORY_CARE_PROVIDER_SITE_OTHER): Payer: Medicare Other | Admitting: Diagnostic Neuroimaging

## 2014-07-26 ENCOUNTER — Encounter: Payer: Self-pay | Admitting: Diagnostic Neuroimaging

## 2014-07-26 VITALS — BP 163/74 | HR 54 | Temp 97.3°F | Ht 61.0 in | Wt 214.6 lb

## 2014-07-26 DIAGNOSIS — G4733 Obstructive sleep apnea (adult) (pediatric): Secondary | ICD-10-CM

## 2014-07-26 DIAGNOSIS — F32A Depression, unspecified: Secondary | ICD-10-CM

## 2014-07-26 DIAGNOSIS — R413 Other amnesia: Secondary | ICD-10-CM

## 2014-07-26 DIAGNOSIS — G894 Chronic pain syndrome: Secondary | ICD-10-CM

## 2014-07-26 DIAGNOSIS — F329 Major depressive disorder, single episode, unspecified: Secondary | ICD-10-CM

## 2014-07-26 NOTE — Progress Notes (Signed)
[facility]  PATIENT: [name] DOB: 1941/01/06  REFERRING CLINICIAN: Avbuere HISTORY FROM: patient REASON FOR VISIT: follow up   HISTORICAL  CHIEF COMPLAINT:  Chief Complaint  Patient presents with  . Follow-up    memory    HISTORY OF PRESENT ILLNESS:   UPDATE 07/26/14: Since last visit, she is stable. Here alone for this visit. Still struggling with stress (husband still in hospice), depression, chronic pain. On aricept 10mg  daily. Still driving. Got lost on the way here. Usually able to get places that she is familiar with.   PRIOR HPI (01/24/14): 73 year old left-handed female here for evaluation of memory loss. Since 2013 patient has had progressive short-term memory problems, forgetting recent events, having more trouble paying bills, having trouble cooking and preparing meals. She is primary caregiver for her husband who is in hospice for cancer. Patient and her son have noticed these memory problems. Patient was started on Aricept in 2013, and she's been tolerating this well. It was recently increased from 5 up to 10 mg per day. Patient also struggling with chronic pain, anxiety, depression, sleep apnea. She is seeing psychiatry now and pain management.  REVIEW OF SYSTEMS: Full 14 system review of systems performed and notable only for memory loss confusion depression speech diff decr activity eye itching / redness apnea.   ALLERGIES: Allergies  Allergen Reactions  . Codeine Itching    HOME MEDICATIONS: Outpatient Prescriptions Prior to Visit  Medication Sig Dispense Refill  . alendronate (FOSAMAX) 70 MG tablet Take 70 mg by mouth every 7 (seven) days. Take on Sundays. Take with a full glass of water on an empty stomach.    . ALPRAZolam (XANAX) 0.5 MG tablet Take 0.5 mg by mouth at bedtime as needed for anxiety.    Marland Kitchen amLODipine (NORVASC) 5 MG tablet Take 5 mg by mouth daily.    Marland Kitchen aspirin EC 81 MG tablet Take 325 mg by mouth daily.     Marland Kitchen  atorvastatin (LIPITOR) 20 MG tablet Take 20 mg by mouth daily.    Marland Kitchen desvenlafaxine (PRISTIQ) 100 MG 24 hr tablet Take 100 mg by mouth daily.    Marland Kitchen donepezil (ARICEPT) 10 MG tablet Take 10 mg by mouth at bedtime.    . fentaNYL (DURAGESIC - DOSED MCG/HR) 50 MCG/HR Place 75 mcg onto the skin every 3 (three) days.     . fexofenadine (ALLEGRA) 180 MG tablet Take 180 mg by mouth daily as needed. For allergies.    . furosemide (LASIX) 40 MG tablet Take 40 mg by mouth daily as needed. For swelling.    Marland Kitchen lisinopril (PRINIVIL,ZESTRIL) 10 MG tablet Take 10 mg by mouth daily.    Marland Kitchen oxyCODONE (ROXICODONE) 15 MG immediate release tablet Take 15 mg by mouth every 4 (four) hours as needed for pain.    . potassium chloride (KLOR-CON 10) 10 MEQ tablet Take 10 mEq by mouth 2 (two) times daily.    . prednisoLONE acetate (PRED FORTE) 1 % ophthalmic suspension Place 1 drop into the left eye 3 (three) times daily.     No facility-administered medications prior to visit.    PAST MEDICAL HISTORY: Past Medical History  Diagnosis Date  . Chronic back pain   . Hypertension   . Scoliosis   . Hyperlipemia     PAST SURGICAL HISTORY: Past Surgical History  Procedure Laterality Date  . Oophorectomy  1970  . Tubal ligation  1983  . Arthroscopic repair acl  02/03/2004  . Spine  surgery  03/31/2006    FAMILY HISTORY: Family History  Problem Relation Age of Onset  . Other Father     kidney problems    SOCIAL HISTORY:  History   Social History  . Marital Status: Married    Spouse Name: Chrissie Noa    Number of Children: 2  . Years of Education: HS   Occupational History  .     Social History Main Topics  . Smoking status: Former Smoker -- 1.00 packs/day for 10 years    Types: Cigarettes    Quit date: 08/12/1973  . Smokeless tobacco: Never Used  . Alcohol Use: No  . Drug Use: No  . Sexual Activity: Not on file   Other Topics Concern  . Not on file   Social History Narrative   Patient lives at home  with spouse.   Caffeine Use: occasionally     PHYSICAL EXAM  Filed Vitals:   07/26/14 1428  BP: 163/74  Pulse: 54  Temp: 97.3 F (36.3 C)  TempSrc: Oral  Height: 5\' 1"  (1.549 m)  Weight: 214 lb 9.6 oz (97.342 kg)    Not recorded     Body mass index is 40.57 kg/(m^2).  MMSE - Mini Mental State Exam 07/26/2014  Orientation to time 5  Orientation to Place 5  Registration 3  Attention/ Calculation 1  Recall 2  Language- name 2 objects 2  Language- repeat 1  Language- follow 3 step command 3  Language- read & follow direction 1  Write a sentence 1  Copy design 1  Total score 25    GENERAL EXAM: Patient is in no distress; well developed, nourished and groomed; neck is supple  CARDIOVASCULAR: Regular rate and rhythm, no murmurs, no carotid bruits  NEUROLOGIC: MENTAL STATUS: awake, alert, oriented to person, place and time, recent and remote memory intact, normal attention and concentration, language fluent, comprehension intact, naming intact, fund of knowledge appropriate; GDS 8. NO FRONTAL RELEASE SIGNS. CRANIAL NERVE:  pupils equal and reactive to light, visual fields full to confrontation, extraocular muscles intact, no nystagmus, facial sensation and strength symmetric, hearing intact, palate elevates symmetrically, uvula midline, shoulder shrug symmetric, tongue midline. MOTOR: normal bulk and tone, full strength in the BUE, BLE SENSORY: normal and symmetric to light touch COORDINATION: finger-nose-finger, fine finger movements normal REFLEXES: deep tendon reflexes present and symmetric GAIT/STATION: narrow based gait; ANTALGIC, SLOW TO RISE. USES SINGLE POINT CANE. UNSTEADY.   DIAGNOSTIC DATA (LABS, IMAGING, TESTING) - I reviewed patient records, labs, notes, testing and imaging myself where available.  No results found for: WBC No results found for: NA No results found for: CHOL No results found for: HGBA1C Lab Results  Component Value Date   VITAMINB12  627 01/24/2014   Lab Results  Component Value Date   TSH 0.224* 01/24/2014    I reviewed images myself and agree with interpretation. -VRP  02/02/14 MRI brain (without) demonstrating: 1. 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 2. Mild perisylvian atrophy. 3. No acute findings.       ASSESSMENT AND PLAN  73 y.o. year old female here with memory loss since 2013. Significant contributing factors (depression, anxiety, pain syndrome, pain medications, sleep apnea). Unlikely to represent primary neurodegenerative condition at this time. MMSE stable (24-->25).  PLAN: - continue pain mgmt, psychiatry and sleep disorder treatement - focus on healthy eating, physical activity and social/mental stimulating activities - ok to continue aricept (started by pcp); however would not  add on namenda at this time  No orders of the defined types were placed in this encounter.   Return in about 1 year (around 07/27/2015).    Suanne MarkerVIKRAM R. PENUMALLI, MD 07/26/2014, 3:29 PM Certified in Neurology, Neurophysiology and Neuroimaging  [facility] [address], [address] [place], [place] [address] [phone]

## 2014-07-28 ENCOUNTER — Ambulatory Visit: Payer: Medicare Other

## 2014-08-10 ENCOUNTER — Other Ambulatory Visit: Payer: Self-pay

## 2014-08-10 ENCOUNTER — Ambulatory Visit
Admission: RE | Admit: 2014-08-10 | Discharge: 2014-08-10 | Disposition: A | Discharge status: Home / Self Care | Payer: Medicare Other | Source: Ambulatory Visit

## 2014-08-10 DIAGNOSIS — Z1231 Encounter for screening mammogram for malignant neoplasm of breast: Secondary | ICD-10-CM

## 2014-12-09 ENCOUNTER — Ambulatory Visit: Payer: Medicare Other | Attending: Internal Medicine | Admitting: Physical Therapy

## 2014-12-09 DIAGNOSIS — R269 Unspecified abnormalities of gait and mobility: Secondary | ICD-10-CM | POA: Insufficient documentation

## 2014-12-09 DIAGNOSIS — R29898 Other symptoms and signs involving the musculoskeletal system: Secondary | ICD-10-CM | POA: Diagnosis not present

## 2014-12-09 DIAGNOSIS — M24662 Ankylosis, left knee: Secondary | ICD-10-CM | POA: Diagnosis not present

## 2014-12-09 DIAGNOSIS — M25561 Pain in right knee: Secondary | ICD-10-CM | POA: Diagnosis not present

## 2014-12-09 DIAGNOSIS — M24661 Ankylosis, right knee: Secondary | ICD-10-CM | POA: Insufficient documentation

## 2014-12-09 DIAGNOSIS — R293 Abnormal posture: Secondary | ICD-10-CM | POA: Insufficient documentation

## 2014-12-09 DIAGNOSIS — M25562 Pain in left knee: Secondary | ICD-10-CM | POA: Diagnosis not present

## 2014-12-09 NOTE — Therapy (Signed)
[facility] [address] [place], [place], [address] Phone: [phone]   Fax:  [phone]  Physical Therapy Evaluation  Patient Details  Name: [name] MRN: [id number] Date of Birth: 1941-01-08 Referring Provider:  [name], MD  Encounter Date: 12/09/2014      PT End of Session - 12/09/14 1218    Visit Number 1   Number of Visits 16   Date for PT Re-Evaluation 02/03/15   PT Start Time 1015   PT Stop Time 1100   PT Time Calculation (min) 45 min   Activity Tolerance Patient tolerated treatment well;Patient limited by fatigue;Patient limited by pain   Behavior During Therapy [facility] for tasks assessed/performed      Past Medical History  Diagnosis Date  . Chronic back pain   . Hypertension   . Scoliosis   . Hyperlipemia     Past Surgical History  Procedure Laterality Date  . Oophorectomy  1970  . Tubal ligation  1983  . Arthroscopic repair acl  02/03/2004  . Spine surgery  03/31/2006    There were no vitals filed for this visit.  Visit Diagnosis:  Bilateral knee pain - Plan: PT plan of care cert/re-cert  Decreased range of knee movement, left - Plan: PT plan of care cert/re-cert  Decreased range of knee movement, right - Plan: PT plan of care cert/re-cert  Weakness of both legs - Plan: PT plan of care cert/re-cert  Abnormality of gait - Plan: PT plan of care cert/re-cert  Posture abnormality - Plan: PT plan of care cert/re-cert      Subjective Assessment - 12/09/14 1026    Subjective Pt is a 74 y.o F with CC of bil knee pain with L>R. She reports that the knees have been bother her for a while.  she reports she gets an injection every 4 months but reports its starting to not help. she reports that the symptoms have gotten worse since the last couple of months   Patient is accompained by: Family member  grand daughter   Pertinent History depression, dementia   Limitations  Sitting;Lifting;Standing;Walking;Reading;House hold activities;Other (comment)  memory   How long can you sit comfortably? 30 min   How long can you stand comfortably? 5 min   How long can you walk comfortably? 5 min   Diagnostic tests pt reports not having one for a while.    Patient Stated Goals to be pain free, strengthening, walking better.    Currently in Pain? Yes   Pain Score 8    Pain Location Knee   Pain Orientation Right;Left;Anterior;Medial;Lateral   Pain Descriptors / Indicators Aching   Pain Type Chronic pain   Pain Onset More than a month ago   Pain Frequency Intermittent   Aggravating Factors  prolonged psitions and sitting and standing.    Pain Relieving Factors voltaren    Effect of Pain on Daily Activities limited endurance with weight bearing activities   Multiple Pain Sites Yes   Pain Score 8   Pain Location Back   Pain Orientation Lower;Mid;Right;Left   Pain Descriptors / Indicators Aching   Pain Type Chronic pain   Pain Onset More than a month ago   Pain Frequency Intermittent   Aggravating Factors  prolonged standing, walking    Pain Relieving Factors pain medication, heating pad   Effect of Pain on Daily Activities limited endurance             [facility] PT Assessment - 12/09/14  1036    Assessment   Medical Diagnosis bil knee pain   Onset Date --  more than a couple of years   Next MD Visit 5/12/s016   Prior Therapy yes    knee   Precautions   Precautions None   Restrictions   Weight Bearing Restrictions No   Balance Screen   Has the patient fallen in the past 6 months No   Has the patient had a decrease in activity level because of a fear of falling?  No   Is the patient reluctant to leave their home because of a fear of falling?  No   Home Environment   Living Enviornment Private residence   Living Arrangements Children   Available Help at Discharge Available 24 hours/day   Type of Home House   Home Access Stairs to enter   Entrance  Stairs-Number of Steps 6   Entrance Stairs-Rails --  both sides but can reach only one at a time   Home Layout One level   Home Equipment Cane - quad  rollator   Prior Function   Level of Independence Needs assistance with ADLs;Needs assistance with homemaking;Needs assistance with gait;Requires assistive device for independence   Vocation Retired   Observation/Other Assessments   Focus on Therapeutic Outcomes (FOTO)  77 % limitation  57 % limitation predicted   Posture/Postural Control   Posture/Postural Control Postural limitations   Postural Limitations Rounded Shoulders;Forward head;Increased thoracic kyphosis   ROM / Strength   AROM / PROM / Strength AROM;Strength;PROM   AROM   AROM Assessment Site Knee   Right Knee Extension -12   Right Knee Flexion 115   Left Knee Extension -12   Left Knee Flexion 118   PROM   PROM Assessment Site Knee   Right/Left Knee Right;Left   Left Knee Extension -10  R knee -10   Left Knee Flexion 122  R knee 124   Strength   Strength Assessment Site Knee   Right Knee Flexion 2-/5   Right Knee Extension 2-/5   Left Knee Flexion 2-/5   Left Knee Extension 2-/5   Palpation   Palpation tenderness loacted at the lateral joint line of the R knee and the medial joint, and medial joint    McMurray Test   Findings Positive   Side --  bil   Ambulation/Gait   Ambulation/Gait Yes   Gait Pattern Step-to pattern;Decreased step length - left;Decreased stance time - right;Antalgic;Narrow base of support  use of a SPC on the R side                   [facility] Adult PT Treatment/Exercise - 12/09/14 1036    Knee/Hip Exercises: Stretches   Passive Hamstring Stretch 2 reps;30 seconds   Knee/Hip Exercises: Supine   Quad Sets AROM;Strengthening;Both;1 set;5 reps   Heel Slides AROM;Strengthening;Both;1 set;10 reps   Bridges AROM;Strengthening;Both;1 set;5 reps   Bridges Limitations difficulty pushing off table, so ony did glute set   Straight Leg  Raises AROM;Strengthening;Both;1 set;5 reps   Straight Leg Raises Limitations could only lift 2-3 inches of table   fatigued quickly                PT Education - 12/09/14 1217    Education provided Yes   Education Details evaluation findings, POC, goals, HEP, anatomical education.   Person(s) Educated Patient;Child(ren)  grand daughter   Methods Explanation   Comprehension Verbalized understanding  PT Short Term Goals - 12/09/14 1230    PT SHORT TERM GOAL #1   Title pt will be I with basic HEP 01/08/2015   Time 4   Period Weeks   Status New   PT SHORT TERM GOAL #2   Title pt will increase bil knee AROM to -5 / 125 to assist with functional and efficient gait 01/08/2015   Time 4   Period Weeks   Status New   PT SHORT TERM GOAL #3   Title she will demonsrate increased strength to >3/5 to help with exercise progression and safety during ambulation 01/08/2015   Time 4   Period Weeks   Status New   PT SHORT TERM GOAL #4   Title she will be able to stand/ walk with <7/10 for >10 minutes to help promote endurance to assist with ADLs 01/08/2015   Time 4   Period Weeks   Status New   PT SHORT TERM GOAL #5   Title pt will be able to verbalize and demonstrate techniques to control pain and inflammation via RICE method 01/08/2015   Time 4   Period Weeks   Status New           PT Long Term Goals - 12/09/14 1230    PT LONG TERM GOAL #1   Title pt will be I with advanced HEP 02/03/2015   Time 8   Period Weeks   Status New   PT LONG TERM GOAL #2   Title pt will increase bil knee strength to > 4-/5 to help with weight bearing endurance and safety with ambulation 02/03/2015   Time 8   Period Weeks   Status New   PT LONG TERM GOAL #3   Title She will demonstate bil knee AROM of greater than -2 to 128 to assist with gait efficency to promote energy conservation 02/03/2015   Time 8   Period Weeks   Status New   PT LONG TERM GOAL #4   Title She will be able to  tolerated standing and walking for >20 minutes and/or >10 steps with <4/10 pain to help endurances and gait safety 02/03/2015   Time 8   Period Weeks   Status New   PT LONG TERM GOAL #5   Title She will be able to verbalize and demonstrate techniques to reduce risk or bil knee reinjury via postural awareness, ambulation with LRAD, and HEP 02/03/2015   Time 8   Period Weeks   Status New               Plan - 12/09/14 1220    Clinical Impression Statement Tieesha presents to OPPT with CC of bil knee pain with L>R. She was accompanied by her grandaughter and has a hx of depression and dementia. She demonstrates limited knee AROM bil with -12 to 115 on the R and -12 to 118 on the L, PROM on R was -10 to 124 and L -10 to 122.  Strength is signicantly diminished bil rated at 2-/5 testing due to by pain and weakness. she has a increased thoracic kyphosis with forward head posture and ant rolled shoudler and ambulates using a SPC on the R  exhibiting and antalgic step to gait pattern with limited step on the R and decreased stance time on the L. She would benefit from skilld physical therapy to maximize her function by addressin the impairments lised.    Pt will benefit from skilled therapeutic intervention in order to improve  on the following deficits Abnormal gait;Decreased knowledge of use of DME;Impaired flexibility;Pain;Improper body mechanics;Increased fascial restricitons;Decreased mobility;Decreased activity tolerance;Decreased endurance;Decreased range of motion;Decreased strength;Postural dysfunction;Difficulty walking;Decreased safety awareness;Decreased balance   Rehab Potential Good   PT Frequency 2x / week   PT Duration 8 weeks   PT Treatment/Interventions ADLs/Self Care Home Management;Electrical Stimulation;Gait training;Therapeutic exercise;Patient/family education;Moist Heat;Stair training;Balance training;Passive range of motion;Manual techniques;Neuromuscular re-education;Functional  mobility training;Cryotherapy;Ultrasound;Therapeutic activities;Dry needling   PT Next Visit Plan assess response to HEP, modalities for pain PRN, manual on bil knee, strengthening   PT Home Exercise Plan see HEP handout   Consulted and Agree with Plan of Care Patient;Family member/caregiver   Family Member Consulted grand daughter          G-Codes - 12-24-2014 1244    Functional Assessment Tool Used FOTO 77% limited   Functional Limitation Mobility: Walking and moving around   Mobility: Walking and Moving Around Current Status [phone]) At least 60 percent but less than 80 percent impaired, limited or restricted   Mobility: Walking and Moving Around Goal Status [phone]) At least 40 percent but less than 60 percent impaired, limited or restricted       Problem List There are no active problems to display for this patient.  Lulu Riding PT, DPT, LAT, ATC  24-Dec-2014  12:49 PM   [facility] [address] [place], [place], [address] Phone: [phone]   Fax:  [phone]

## 2014-12-09 NOTE — Patient Instructions (Signed)
   Karagan Lehr PT, DPT, LAT, ATC  [facility] Phone: [phone]     

## 2014-12-12 ENCOUNTER — Ambulatory Visit: Payer: Medicare Other | Attending: Internal Medicine | Admitting: Physical Therapy

## 2014-12-12 DIAGNOSIS — M25561 Pain in right knee: Secondary | ICD-10-CM | POA: Diagnosis not present

## 2014-12-12 DIAGNOSIS — M24662 Ankylosis, left knee: Secondary | ICD-10-CM | POA: Insufficient documentation

## 2014-12-12 DIAGNOSIS — R29898 Other symptoms and signs involving the musculoskeletal system: Secondary | ICD-10-CM | POA: Diagnosis not present

## 2014-12-12 DIAGNOSIS — M25562 Pain in left knee: Secondary | ICD-10-CM | POA: Diagnosis present

## 2014-12-12 DIAGNOSIS — M24661 Ankylosis, right knee: Secondary | ICD-10-CM | POA: Insufficient documentation

## 2014-12-12 DIAGNOSIS — R293 Abnormal posture: Secondary | ICD-10-CM | POA: Diagnosis not present

## 2014-12-12 DIAGNOSIS — R269 Unspecified abnormalities of gait and mobility: Secondary | ICD-10-CM | POA: Diagnosis not present

## 2014-12-12 NOTE — Therapy (Signed)
[facility] [address] [place], [place], [address] Phone: [phone]   Fax:  [phone]  Physical Therapy Treatment  Patient Details  Name: [name] MRN: [id number] Date of Birth: 17-Nov-1940 Referring Provider:  [name], MD  Encounter Date: 12/12/2014      PT End of Session - 12/12/14 0929    Visit Number 2   Number of Visits 16   Date for PT Re-Evaluation 02/03/15   PT Start Time 0851   PT Stop Time 0929   PT Time Calculation (min) 38 min   Activity Tolerance Patient tolerated treatment well      Past Medical History  Diagnosis Date  . Chronic back pain   . Hypertension   . Scoliosis   . Hyperlipemia     Past Surgical History  Procedure Laterality Date  . Oophorectomy  1970  . Tubal ligation  1983  . Arthroscopic repair acl  02/03/2004  . Spine surgery  03/31/2006    There were no vitals filed for this visit.  Visit Diagnosis:  Decreased range of knee movement, left  Decreased range of knee movement, right  Bilateral knee pain  Weakness of both legs      Subjective Assessment - 12/12/14 0858    Subjective no pain right now, walking back to table 6/10, back is 6/10   Patient is accompained by: Family member   Currently in Pain? Yes   Pain Score 6    Pain Location Knee   Pain Orientation Right;Left;Anterior;Medial;Lateral   Pain Descriptors / Indicators Aching   Aggravating Factors  long sitting, walking   Pain Relieving Factors moving knrr back and forth   Effect of Pain on Daily Activities limits walking, sitting   Multiple Pain Sites Yes   Pain Score 6   Pain Location Back   Pain Orientation Right;Left;Mid;Lower   Pain Descriptors / Indicators Aching   Aggravating Factors  standing walking   Pain Relieving Factors rest heating pad, medication                         [facility] Adult PT Treatment/Exercise - 12/12/14 0910    Knee/Hip Exercises: Stretches   Passive Hamstring Stretch 3 reps;30 seconds   Gastroc Stretch 3 reps;30 seconds  2 inches, both   Knee/Hip Exercises: Supine   Quad Sets 10 reps  cues   Short Arc Quad Sets 1 set;10 reps  0 weights, cues to roll Rt hip into a little ER to decrease    Short Arc Quad Sets Limitations AA for Rt hip position Rt   Patellar Mobs bilateral   Manual Therapy   Manual Therapy --  retrograde for edema, both with education                PT Education - 12/12/14 [phone]    Education provided Yes   Education Details retrograde for edema   Person(s) Educated Patient;Other (comment)  grandaughter   Methods Explanation;Demonstration   Comprehension Verbalized understanding          PT Short Term Goals - 12/12/14 1022    PT SHORT TERM GOAL #1   Title pt will be I with basic HEP 01/08/2015   Baseline not yet consistant, independent   Time 4   Period Weeks   Status On-going   PT SHORT TERM GOAL #2   Title pt will increase bil knee AROM to -5 / 125 to assist with functional and efficient  gait 01/08/2015   Time 4   Period Weeks   Status On-going   PT SHORT TERM GOAL #3   Title she will demonsrate increased strength to >3/5 to help with exercise progression and safety during ambulation 01/08/2015   Time 4   Period Weeks   Status On-going   PT SHORT TERM GOAL #4   Title she will be able to stand/ walk with <7/10 for >10 minutes to help promote endurance to assist with ADLs 01/08/2015   Time 4   Period Weeks   Status On-going   PT SHORT TERM GOAL #5   Title pt will be able to verbalize and demonstrate techniques to control pain and inflammation via RICE method 01/08/2015   Baseline 1 tenique verbalized   Time 4   Period Weeks   Status On-going           PT Long Term Goals - 12/09/14 1230    PT LONG TERM GOAL #1   Title pt will be I with advanced HEP 02/03/2015   Time 8   Period Weeks   Status New   PT LONG TERM GOAL #2   Title pt will increase bil knee strength to > 4-/5 to  help with weight bearing endurance and safety with ambulation 02/03/2015   Time 8   Period Weeks   Status New   PT LONG TERM GOAL #3   Title She will demonstate bil knee AROM of greater than -2 to 128 to assist with gait efficency to promote energy conservation 02/03/2015   Time 8   Period Weeks   Status New   PT LONG TERM GOAL #4   Title She will be able to tolerated standing and walking for >20 minutes and/or >10 steps with <4/10 pain to help endurances and gait safety 02/03/2015   Time 8   Period Weeks   Status New   PT LONG TERM GOAL #5   Title She will be able to verbalize and demonstrate techniques to reduce risk or bil knee reinjury via postural awareness, ambulation with LRAD, and HEP 02/03/2015   Time 8   Period Weeks   Status New               Plan - 12/12/14 0930    Clinical Impression Statement not yet consistant with home exercise.   stretch / beginning focus today. no new home exercises due to not yet doing the ones shr has already.  knees "felt fine " post PT.   PT Next Visit Plan quad, anterior hip stretches, add calf stretch to home, consider taping. continue manual, [place] posterior distal hamstrings?   Consulted and Agree with Plan of Care Patient        Problem List There are no active problems to display for this patient.   [facility] 12/12/2014, 10:25 AM  [facility] [address] [place], [place], [address] Phone: [phone]   Fax:  [phone]  Liz Beach, PTA 12/12/2014 10:25 AM Phone: [phone] Fax: [phone]

## 2014-12-14 ENCOUNTER — Emergency Department (HOSPITAL_COMMUNITY)
Admission: EM | Admit: 2014-12-14 | Discharge: 2014-12-14 | Disposition: A | Discharge status: Home / Self Care | Payer: Medicare Other | Attending: Emergency Medicine | Admitting: Emergency Medicine

## 2014-12-14 ENCOUNTER — Emergency Department (HOSPITAL_COMMUNITY): Payer: Medicare Other

## 2014-12-14 ENCOUNTER — Encounter (HOSPITAL_COMMUNITY): Payer: Self-pay | Admitting: Emergency Medicine

## 2014-12-14 DIAGNOSIS — W010XXA Fall on same level from slipping, tripping and stumbling without subsequent striking against object, initial encounter: Secondary | ICD-10-CM | POA: Diagnosis not present

## 2014-12-14 DIAGNOSIS — Z7952 Long term (current) use of systemic steroids: Secondary | ICD-10-CM | POA: Diagnosis not present

## 2014-12-14 DIAGNOSIS — Y9389 Activity, other specified: Secondary | ICD-10-CM | POA: Insufficient documentation

## 2014-12-14 DIAGNOSIS — E785 Hyperlipidemia, unspecified: Secondary | ICD-10-CM | POA: Diagnosis not present

## 2014-12-14 DIAGNOSIS — S8992XA Unspecified injury of left lower leg, initial encounter: Secondary | ICD-10-CM | POA: Diagnosis present

## 2014-12-14 DIAGNOSIS — Z79899 Other long term (current) drug therapy: Secondary | ICD-10-CM | POA: Diagnosis not present

## 2014-12-14 DIAGNOSIS — Y9289 Other specified places as the place of occurrence of the external cause: Secondary | ICD-10-CM | POA: Diagnosis not present

## 2014-12-14 DIAGNOSIS — Y998 Other external cause status: Secondary | ICD-10-CM | POA: Diagnosis not present

## 2014-12-14 DIAGNOSIS — Z7982 Long term (current) use of aspirin: Secondary | ICD-10-CM | POA: Diagnosis not present

## 2014-12-14 DIAGNOSIS — M1712 Unilateral primary osteoarthritis, left knee: Secondary | ICD-10-CM | POA: Diagnosis not present

## 2014-12-14 DIAGNOSIS — I10 Essential (primary) hypertension: Secondary | ICD-10-CM | POA: Insufficient documentation

## 2014-12-14 DIAGNOSIS — Z87891 Personal history of nicotine dependence: Secondary | ICD-10-CM | POA: Insufficient documentation

## 2014-12-14 DIAGNOSIS — G8929 Other chronic pain: Secondary | ICD-10-CM | POA: Insufficient documentation

## 2014-12-14 HISTORY — DX: Unspecified osteoarthritis, unspecified site: M19.90

## 2014-12-14 MED ORDER — ACETAMINOPHEN 325 MG PO TABS: 650.0000 mg | ORAL_TABLET | Freq: Once | ORAL | Status: DC

## 2014-12-14 MED ORDER — KETOROLAC TROMETHAMINE 60 MG/2ML IM SOLN
30.0000 mg | Freq: Once | INTRAMUSCULAR | Status: AC
  Administered 2014-12-14: 30 mg via INTRAMUSCULAR
  Filled 2014-12-14: qty 2

## 2014-12-14 NOTE — ED Provider Notes (Signed)
CSN: [id number]     Arrival date & time 12/14/14  1358 History   First MD Initiated Contact with Patient 12/14/14 1418     Chief Complaint  Patient presents with  . Knee Pain  . Fall     (Consider location/radiation/quality/duration/timing/severity/associated sxs/prior Treatment) HPI  [name] is a 74 y.o. female with PMH of chronic back pain see my pain management, hypertension, arthritis presenting with fall yesterday where she not try left knee up against a step stool while she is reaching for something on her bed and slid down the bed. Patient states her pain is sharp and worse with movement. It is localized in her left knee. She denies numbness, tingling, weakness. No fevers, nausea, vomiting, swelling or redness. Patient states she is in chronic pain management and took oxycodone without significant improvement of her symptoms. Patient also has been undergoing rehabilitation for her left knee with last appointment 2 days ago.   Past Medical History  Diagnosis Date  . Chronic back pain   . Hypertension   . Scoliosis   . Hyperlipemia   . Arthritis    Past Surgical History  Procedure Laterality Date  . Oophorectomy  1970  . Tubal ligation  1983  . Arthroscopic repair acl  02/03/2004  . Spine surgery  03/31/2006   Family History  Problem Relation Age of Onset  . Other Father     kidney problems   History  Substance Use Topics  . Smoking status: Former Smoker -- 1.00 packs/day for 10 years    Types: Cigarettes    Quit date: 08/12/1973  . Smokeless tobacco: Never Used  . Alcohol Use: No   OB History    No data available     Review of Systems  Musculoskeletal: Positive for myalgias. Negative for joint swelling.  Skin: Negative for color change and wound.  Neurological: Negative for weakness and numbness.      Allergies  Codeine  Home Medications   Prior to Admission medications   Medication Sig Start Date End Date Taking? Authorizing Provider   alendronate (FOSAMAX) 70 MG tablet Take 70 mg by mouth every 7 (seven) days. Take on Sundays. Take with a full glass of water on an empty stomach.    Historical Provider, MD  ALPRAZolam Prudy Feeler) 0.5 MG tablet Take 0.5 mg by mouth at bedtime as needed for anxiety.    Historical Provider, MD  amLODipine (NORVASC) 5 MG tablet Take 5 mg by mouth daily.    Historical Provider, MD  aspirin EC 81 MG tablet Take 325 mg by mouth daily.     Historical Provider, MD  atorvastatin (LIPITOR) 20 MG tablet Take 20 mg by mouth daily.    Historical Provider, MD  desvenlafaxine (PRISTIQ) 100 MG 24 hr tablet Take 100 mg by mouth daily.    Historical Provider, MD  donepezil (ARICEPT) 10 MG tablet Take 10 mg by mouth at bedtime.    Historical Provider, MD  fentaNYL (DURAGESIC - DOSED MCG/HR) 50 MCG/HR Place 75 mcg onto the skin every 3 (three) days.     Historical Provider, MD  fexofenadine (ALLEGRA) 180 MG tablet Take 180 mg by mouth daily as needed. For allergies.    Historical Provider, MD  furosemide (LASIX) 40 MG tablet Take 40 mg by mouth daily as needed. For swelling.    Historical Provider, MD  lisinopril (PRINIVIL,ZESTRIL) 10 MG tablet Take 10 mg by mouth daily.    Historical Provider, MD  oxyCODONE (ROXICODONE) 15 MG  immediate release tablet Take 15 mg by mouth every 4 (four) hours as needed for pain.    Historical Provider, MD  potassium chloride (KLOR-CON 10) 10 MEQ tablet Take 10 mEq by mouth 2 (two) times daily.    Historical Provider, MD  prednisoLONE acetate (PRED FORTE) 1 % ophthalmic suspension Place 1 drop into the left eye 3 (three) times daily.    Historical Provider, MD  PROAIR HFA 108 (90 BASE) MCG/ACT inhaler as needed. 04/27/14   Historical Provider, MD   BP 145/83 mmHg  Pulse 55  Temp(Src) 97.6 F (36.4 C) (Oral)  Resp 18  SpO2 97% Physical Exam  Constitutional: She appears well-developed and well-nourished. No distress.  HENT:  Head: Normocephalic and atraumatic.  Eyes: Conjunctivae  are normal. Right eye exhibits no discharge. Left eye exhibits no discharge.  Pulmonary/Chest: Effort normal. No respiratory distress.  Musculoskeletal:  Left knee: No effusion, erythema or warmth. Tenderness to anterior patella. No tenderness over medial or lateral joint line. No ligamentous laxity noted. Negative anterior and posterior drawer test. Steady gait with cane  No tenderness to lower leg or thigh. No lesions, ecchymoses or wounds to thigh or leg. Full Range of motion of left ankle without pain.  Neurological: She is alert. Coordination normal.  5/5 strength in right lower extremity 4/5 strength of left quadriceps 5/5 strength of left hamstrings, gastrocnemius, and muscles of anterior compartment  Skin: She is not diaphoretic.  Psychiatric: She has a normal mood and affect. Her behavior is normal.  Nursing note and vitals reviewed.   ED Course  Procedures (including critical care time) Labs Review Labs Reviewed - No data to display  Imaging Review Dg Knee Complete 4 Views Left  12/14/2014   CLINICAL DATA:  Fall.  Knee pain  EXAM: LEFT KNEE - COMPLETE 4+ VIEW  COMPARISON:  12/17/2010  FINDINGS: Moderate degenerative change in the knee most severe in the lateral joint compartment with joint space narrowing and spurring. Medial joint space is maintained with mild spurring. Mild patellofemoral degenerative change.  No fracture or joint effusion.  IMPRESSION: Moderate degenerative change in the lateral compartment. No fracture identified   Electronically Signed   By: Marlan Palau M.D.   On: 12/14/2014 15:39     EKG Interpretation None      MDM   Final diagnoses:  Left knee injury, initial encounter  Primary osteoarthritis of knee, left   Patient presenting with mechanical fall with left knee injury. Patient has been diagnosed with osteoarthritis and is currently undergoing physical therapy. The neurovascularly intact without appreciated ligamentous laxity. X-ray without  acute abnormalities. Patient ambulatory with cane. Pt with knee sleeve in place. Patient to follow-up with physical therapist tomorrow as scheduled and follow-up with her orthopedist as needed for persistent pain.  Discussed return precautions with patient. Discussed all results and patient verbalizes understanding and agrees with plan.   Oswaldo Conroy, PA-C 12/14/14 1637  Rolland Porter, MD 12/18/14 [phone]

## 2014-12-14 NOTE — Discharge Instructions (Signed)
Return to the emergency room with worsening of symptoms, new symptoms or with symptoms that are concerning, especially fevers, redness, swelling, numbness, tingling, weakness. Continue to use her cane with ambulation. Go to physical therapy as scheduled tomorrow and follow up with your doctor in 1 week. Read below information and follow recommendations. Knee Pain The knee is the complex joint between your thigh and your lower leg. It is made up of bones, tendons, ligaments, and cartilage. The bones that make up the knee are:  The femur in the thigh.  The tibia and fibula in the lower leg.  The patella or kneecap riding in the groove on the lower femur. CAUSES  Knee pain is a common complaint with many causes. A few of these causes are:  Injury, such as:  A ruptured ligament or tendon injury.  Torn cartilage.  Medical conditions, such as:  Gout  Arthritis  Infections  Overuse, over training, or overdoing a physical activity. Knee pain can be minor or severe. Knee pain can accompany debilitating injury. Minor knee problems often respond well to self-care measures or get well on their own. More serious injuries may need medical intervention or even surgery. SYMPTOMS The knee is complex. Symptoms of knee problems can vary widely. Some of the problems are:  Pain with movement and weight bearing.  Swelling and tenderness.  Buckling of the knee.  Inability to straighten or extend your knee.  Your knee locks and you cannot straighten it.  Warmth and redness with pain and fever.  Deformity or dislocation of the kneecap. DIAGNOSIS  Determining what is wrong may be very straight forward such as when there is an injury. It can also be challenging because of the complexity of the knee. Tests to make a diagnosis may include:  Your caregiver taking a history and doing a physical exam.  Routine X-rays can be used to rule out other problems. X-rays will not reveal a cartilage tear.  Some injuries of the knee can be diagnosed by:  Arthroscopy a surgical technique by which a small video camera is inserted through tiny incisions on the sides of the knee. This procedure is used to examine and repair internal knee joint problems. Tiny instruments can be used during arthroscopy to repair the torn knee cartilage (meniscus).  Arthrography is a radiology technique. A contrast liquid is directly injected into the knee joint. Internal structures of the knee joint then become visible on X-ray film.  An MRI scan is a non X-ray radiology procedure in which magnetic fields and a computer produce two- or three-dimensional images of the inside of the knee. Cartilage tears are often visible using an MRI scanner. MRI scans have largely replaced arthrography in diagnosing cartilage tears of the knee.  Blood work.  Examination of the fluid that helps to lubricate the knee joint (synovial fluid). This is done by taking a sample out using a needle and a syringe. TREATMENT The treatment of knee problems depends on the cause. Some of these treatments are:  Depending on the injury, proper casting, splinting, surgery, or physical therapy care will be needed.  Give yourself adequate recovery time. Do not overuse your joints. If you begin to get sore during workout routines, back off. Slow down or do fewer repetitions.  For repetitive activities such as cycling or running, maintain your strength and nutrition.  Alternate muscle groups. For example, if you are a weight lifter, work the upper body on one day and the lower body the next.  Either tight or weak muscles do not give the proper support for your knee. Tight or weak muscles do not absorb the stress placed on the knee joint. Keep the muscles surrounding the knee strong.  Take care of mechanical problems.  If you have flat feet, orthotics or special shoes may help. See your caregiver if you need help.  Arch supports, sometimes with wedges  on the inner or outer aspect of the heel, can help. These can shift pressure away from the side of the knee most bothered by osteoarthritis.  A brace called an "unloader" brace also may be used to help ease the pressure on the most arthritic side of the knee.  If your caregiver has prescribed crutches, braces, wraps or ice, use as directed. The acronym for this is PRICE. This means protection, rest, ice, compression, and elevation.  Nonsteroidal anti-inflammatory drugs (NSAIDs), can help relieve pain. But if taken immediately after an injury, they may actually increase swelling. Take NSAIDs with food in your stomach. Stop them if you develop stomach problems. Do not take these if you have a history of ulcers, stomach pain, or bleeding from the bowel. Do not take without your caregiver's approval if you have problems with fluid retention, heart failure, or kidney problems.  For ongoing knee problems, physical therapy may be helpful.  Glucosamine and chondroitin are over-the-counter dietary supplements. Both may help relieve the pain of osteoarthritis in the knee. These medicines are different from the usual anti-inflammatory drugs. Glucosamine may decrease the rate of cartilage destruction.  Injections of a corticosteroid drug into your knee joint may help reduce the symptoms of an arthritis flare-up. They may provide pain relief that lasts a few months. You may have to wait a few months between injections. The injections do have a small increased risk of infection, water retention, and elevated blood sugar levels.  Hyaluronic acid injected into damaged joints may ease pain and provide lubrication. These injections may work by reducing inflammation. A series of shots may give relief for as long as 6 months.  Topical painkillers. Applying certain ointments to your skin may help relieve the pain and stiffness of osteoarthritis. Ask your pharmacist for suggestions. Many over the-counter products are  approved for temporary relief of arthritis pain.  In some countries, doctors often prescribe topical NSAIDs for relief of chronic conditions such as arthritis and tendinitis. A review of treatment with NSAID creams found that they worked as well as oral medications but without the serious side effects. PREVENTION  Maintain a healthy weight. Extra pounds put more strain on your joints.  Get strong, stay limber. Weak muscles are a common cause of knee injuries. Stretching is important. Include flexibility exercises in your workouts.  Be smart about exercise. If you have osteoarthritis, chronic knee pain or recurring injuries, you may need to change the way you exercise. This does not mean you have to stop being active. If your knees ache after jogging or playing basketball, consider switching to swimming, water aerobics, or other low-impact activities, at least for a few days a week. Sometimes limiting high-impact activities will provide relief.  Make sure your shoes fit well. Choose footwear that is right for your sport.  Protect your knees. Use the proper gear for knee-sensitive activities. Use kneepads when playing volleyball or laying carpet. Buckle your seat belt every time you drive. Most shattered kneecaps occur in car accidents.  Rest when you are tired. SEEK MEDICAL CARE IF:  You have knee pain that is  continual and does not seem to be getting better.  SEEK IMMEDIATE MEDICAL CARE IF:  Your knee joint feels hot to the touch and you have a high fever. MAKE SURE YOU:   Understand these instructions.  Will watch your condition.  Will get help right away if you are not doing well or get worse. Document Released: 05/26/2007 Document Revised: 10/21/2011 Document Reviewed: 05/26/2007 [facility] Patient Information 2015 [place], [place]. This information is not intended to replace advice given to you by your health care provider. Make sure you discuss any questions you have with your health  care provider.

## 2014-12-14 NOTE — ED Notes (Addendum)
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  Her knee knocked against a step stool when she was reaching for something on her bed and slid down bed. Has been told she has arthritis. Is able to walk on it and has full ROM even though hurts. Chronic back pain.

## 2014-12-15 ENCOUNTER — Ambulatory Visit: Payer: Medicare Other | Admitting: Physical Therapy

## 2014-12-15 ENCOUNTER — Encounter: Payer: Self-pay | Admitting: Physical Therapy

## 2014-12-15 DIAGNOSIS — M24662 Ankylosis, left knee: Secondary | ICD-10-CM

## 2014-12-15 DIAGNOSIS — M25562 Pain in left knee: Secondary | ICD-10-CM | POA: Diagnosis not present

## 2014-12-15 DIAGNOSIS — M25561 Pain in right knee: Secondary | ICD-10-CM

## 2014-12-15 DIAGNOSIS — R29898 Other symptoms and signs involving the musculoskeletal system: Secondary | ICD-10-CM

## 2014-12-15 DIAGNOSIS — R293 Abnormal posture: Secondary | ICD-10-CM

## 2014-12-15 DIAGNOSIS — R269 Unspecified abnormalities of gait and mobility: Secondary | ICD-10-CM

## 2014-12-15 DIAGNOSIS — M24661 Ankylosis, right knee: Secondary | ICD-10-CM

## 2014-12-15 NOTE — Therapy (Signed)
[facility] [address] [place], [place], [address] Phone: [phone]   Fax:  [phone]  Physical Therapy Treatment  Patient Details  Name: [name] MRN: [id number] Date of Birth: 1941-06-05 Referring Provider:  [name], MD  Encounter Date: 12/15/2014      PT End of Session - 12/15/14 1610    Visit Number 3   Number of Visits 16   Date for PT Re-Evaluation 02/03/15   PT Start Time 1505   PT Stop Time 1545   PT Time Calculation (min) 40 min   Activity Tolerance Patient tolerated treatment well      Past Medical History  Diagnosis Date  . Chronic back pain   . Hypertension   . Scoliosis   . Hyperlipemia   . Arthritis     Past Surgical History  Procedure Laterality Date  . Oophorectomy  1970  . Tubal ligation  1983  . Arthroscopic repair acl  02/03/2004  . Spine surgery  03/31/2006    There were no vitals filed for this visit.  Visit Diagnosis:  Bilateral knee pain  Decreased range of knee movement, right  Decreased range of knee movement, left  Weakness of both legs  Abnormality of gait  Posture abnormality      Subjective Assessment - 12/15/14 1610    Subjective pt reports fall on Tuesday landing on L knee and went to ED Wednesday, imaging negative   Patient is accompained by: Family member  granddaughter   Pertinent History depression, dementia   Currently in Pain? Yes   Pain Score 7    Pain Location Knee   Pain Orientation Left   Pain Descriptors / Indicators Aching   Pain Type Chronic pain;Acute pain   Pain Onset More than a month ago   Aggravating Factors  long sitting, walking   Pain Relieving Factors bending knee back                         [facility] Adult PT Treatment/Exercise - 12/15/14 0001    Knee/Hip Exercises: Stretches   Active Hamstring Stretch 30 seconds;2 reps   Knee/Hip Exercises: Seated   Other Seated Knee Exercises resisted hip abdct with  yellow theraband x 10 reps   Knee/Hip Exercises: Supine   Heel Slides Strengthening;10 reps  LLE, x 10 reps with yellow theraband RLE   Manual Therapy   Manual Therapy --   Other Manual Therapy LLE taping - 2 "y's" around patella for knee support                PT Education - 12/15/14 1609    Education provided Yes   Education Details Taping Instructions   Person(s) Educated Patient;Caregiver(s)  granddaughter   Methods Explanation   Comprehension Verbalized understanding          PT Short Term Goals - 12/12/14 1022    PT SHORT TERM GOAL #1   Title pt will be I with basic HEP 01/08/2015   Baseline not yet consistant, independent   Time 4   Period Weeks   Status On-going   PT SHORT TERM GOAL #2   Title pt will increase bil knee AROM to -5 / 125 to assist with functional and efficient gait 01/08/2015   Time 4   Period Weeks   Status On-going   PT SHORT TERM GOAL #3   Title she will demonsrate increased strength to >3/5 to help with exercise progression and  safety during ambulation 01/08/2015   Time 4   Period Weeks   Status On-going   PT SHORT TERM GOAL #4   Title she will be able to stand/ walk with <7/10 for >10 minutes to help promote endurance to assist with ADLs 01/08/2015   Time 4   Period Weeks   Status On-going   PT SHORT TERM GOAL #5   Title pt will be able to verbalize and demonstrate techniques to control pain and inflammation via RICE method 01/08/2015   Baseline 1 tenique verbalized   Time 4   Period Weeks   Status On-going           PT Long Term Goals - 12/09/14 1230    PT LONG TERM GOAL #1   Title pt will be I with advanced HEP 02/03/2015   Time 8   Period Weeks   Status New   PT LONG TERM GOAL #2   Title pt will increase bil knee strength to > 4-/5 to help with weight bearing endurance and safety with ambulation 02/03/2015   Time 8   Period Weeks   Status New   PT LONG TERM GOAL #3   Title She will demonstate bil knee AROM of greater  than -2 to 128 to assist with gait efficency to promote energy conservation 02/03/2015   Time 8   Period Weeks   Status New   PT LONG TERM GOAL #4   Title She will be able to tolerated standing and walking for >20 minutes and/or >10 steps with <4/10 pain to help endurances and gait safety 02/03/2015   Time 8   Period Weeks   Status New   PT LONG TERM GOAL #5   Title She will be able to verbalize and demonstrate techniques to reduce risk or bil knee reinjury via postural awareness, ambulation with LRAD, and HEP 02/03/2015   Time 8   Period Weeks   Status New               Plan - 12/15/14 1612    Clinical Impression Statement Pt reported tape made her knee feel better and more supported in walking after treatment. Pt tolerated seated exercises well and required minimal vc's throughout session. Pt requires extra time for movement during exercises. Improving with exercises at home.   Pt will benefit from skilled therapeutic intervention in order to improve on the following deficits Abnormal gait;Decreased knowledge of use of DME;Impaired flexibility;Pain;Improper body mechanics;Increased fascial restricitons;Decreased mobility;Decreased activity tolerance;Decreased endurance;Decreased range of motion;Decreased strength;Postural dysfunction;Difficulty walking;Decreased safety awareness;Decreased balance   PT Frequency 2x / week   PT Duration 8 weeks   PT Next Visit Plan reassess taping response; quad, anterior hip stretches, add calf stretch to home,continue manual, [place] posterior distal hamstrings?   Consulted and Agree with Plan of Care Patient;Family member/caregiver   Family Member Consulted grand daughter        Problem List There are no active problems to display for this patient.   [name], PT 12/15/2014, 4:18 PM  [facility] [address] [place], [place], [address] Phone: [phone]   Fax:  [phone]

## 2014-12-19 ENCOUNTER — Ambulatory Visit: Payer: Medicare Other | Admitting: Physical Therapy

## 2014-12-21 ENCOUNTER — Ambulatory Visit: Payer: Medicare Other | Admitting: Physical Therapy

## 2014-12-21 ENCOUNTER — Encounter: Payer: Self-pay | Admitting: Physical Therapy

## 2014-12-21 DIAGNOSIS — M24662 Ankylosis, left knee: Secondary | ICD-10-CM

## 2014-12-21 DIAGNOSIS — R269 Unspecified abnormalities of gait and mobility: Secondary | ICD-10-CM

## 2014-12-21 DIAGNOSIS — M25562 Pain in left knee: Principal | ICD-10-CM

## 2014-12-21 DIAGNOSIS — M24661 Ankylosis, right knee: Secondary | ICD-10-CM

## 2014-12-21 DIAGNOSIS — R293 Abnormal posture: Secondary | ICD-10-CM

## 2014-12-21 DIAGNOSIS — R29898 Other symptoms and signs involving the musculoskeletal system: Secondary | ICD-10-CM

## 2014-12-21 DIAGNOSIS — M25561 Pain in right knee: Secondary | ICD-10-CM

## 2014-12-21 NOTE — Therapy (Signed)
[facility] Outpatient Rehabilitation [facility] [address] [place], [place], [address] Phone: [phone]   Fax:  [phone]  Physical Therapy Treatment  Patient Details  Name: [name] MRN: [id number] Date of Birth: 05/23/1941 Referring Provider:  [name], MD  Encounter Date: 12/21/2014      PT End of Session - 12/21/14 1505    Visit Number 4   Number of Visits 16   Date for PT Re-Evaluation 02/03/15   PT Start Time 1502   PT Stop Time 1542   PT Time Calculation (min) 40 min      Past Medical History  Diagnosis Date  . Chronic back pain   . Hypertension   . Scoliosis   . Hyperlipemia   . Arthritis     Past Surgical History  Procedure Laterality Date  . Oophorectomy  1970  . Tubal ligation  1983  . Arthroscopic repair acl  02/03/2004  . Spine surgery  03/31/2006    There were no vitals filed for this visit.  Visit Diagnosis:  Bilateral knee pain  Decreased range of knee movement, right  Decreased range of knee movement, left  Weakness of both legs  Abnormality of gait  Posture abnormality      Subjective Assessment - 12/21/14 1505    Subjective tape felt good for about a day but decreased effect after that, pt reports she drove herself today - granddaughter not feeling well, reports doing exercises once per day   Multiple Pain Sites Yes   Pain Score 5   Pain Location Knee   Pain Orientation Left;Medial;Anterior;Lateral   Pain Descriptors / Indicators Aching;Sore   Aggravating Factors  walking   Pain Relieving Factors medication and ice            [facility] PT Assessment - 12/21/14 0001    AROM   Right Knee Extension -5   Right Knee Flexion 119   Left Knee Extension -8   Left Knee Flexion 118   PROM   Left Knee Extension -7  R to neutral                     [facility] Adult PT Treatment/Exercise - 12/21/14 0001    Knee/Hip Exercises: Aerobic   Stationary Bike Nustep Level 3 x 6 minutes  pt  reports improved pain during and immediately after   Knee/Hip Exercises: Supine   Heel Slides Strengthening;Right;1 set;10 reps  2 lb, LLE x 10 reps no weight   Straight Leg Raises Strengthening;Right;Left;1 set;10 reps  vc's to maintain extension, LLE 6 reps only   Manual Therapy   Manual Therapy Joint mobilization   Joint Mobilization Grade 1-2 P<>A and A<>P for knee flexion/extension   Other Manual Therapy LLE taping - 2 "y's" around patella for knee support                  PT Short Term Goals - 12/12/14 1022    PT SHORT TERM GOAL #1   Title pt will be I with basic HEP 01/08/2015   Baseline not yet consistant, independent   Time 4   Period Weeks   Status On-going   PT SHORT TERM GOAL #2   Title pt will increase bil knee AROM to -5 / 125 to assist with functional and efficient gait 01/08/2015   Time 4   Period Weeks   Status On-going   PT SHORT TERM GOAL #3   Title she will demonsrate increased strength to >3/5  to help with exercise progression and safety during ambulation 01/08/2015   Time 4   Period Weeks   Status On-going   PT SHORT TERM GOAL #4   Title she will be able to stand/ walk with <7/10 for >10 minutes to help promote endurance to assist with ADLs 01/08/2015   Time 4   Period Weeks   Status On-going   PT SHORT TERM GOAL #5   Title pt will be able to verbalize and demonstrate techniques to control pain and inflammation via RICE method 01/08/2015   Baseline 1 tenique verbalized   Time 4   Period Weeks   Status On-going           PT Long Term Goals - 12/09/14 1230    PT LONG TERM GOAL #1   Title pt will be I with advanced HEP 02/03/2015   Time 8   Period Weeks   Status New   PT LONG TERM GOAL #2   Title pt will increase bil knee strength to > 4-/5 to help with weight bearing endurance and safety with ambulation 02/03/2015   Time 8   Period Weeks   Status New   PT LONG TERM GOAL #3   Title She will demonstate bil knee AROM of greater than -2 to  128 to assist with gait efficency to promote energy conservation 02/03/2015   Time 8   Period Weeks   Status New   PT LONG TERM GOAL #4   Title She will be able to tolerated standing and walking for >20 minutes and/or >10 steps with <4/10 pain to help endurances and gait safety 02/03/2015   Time 8   Period Weeks   Status New   PT LONG TERM GOAL #5   Title She will be able to verbalize and demonstrate techniques to reduce risk or bil knee reinjury via postural awareness, ambulation with LRAD, and HEP 02/03/2015   Time 8   Period Weeks   Status New               Plan - 12/21/14 1816    Clinical Impression Statement Pt moving much better today compared to last week's treatment session. Nustep eased pain. Tape again eased pain. Patient improved in bil. AROM for knee extension.    PT Frequency 2x / week   PT Duration 8 weeks   PT Treatment/Interventions ADLs/Self Care Home Management;Electrical Stimulation;Gait training;Therapeutic exercise;Patient/family education;Moist Heat;Stair training;Balance training;Passive range of motion;Manual techniques;Neuromuscular re-education;Functional mobility training;Cryotherapy;Ultrasound;Therapeutic activities;Dry needling   PT Next Visit Plan quad, anterior hip stretches, add calf stretch to home,continue manual        Problem List There are no active problems to display for this patient.   [place], PT  12/21/2014, 6:21 PM  [facility] [address] [place], [place], [address] Phone: [phone]   Fax:  [phone]

## 2014-12-26 ENCOUNTER — Ambulatory Visit: Payer: Medicare Other | Admitting: Physical Therapy

## 2014-12-26 DIAGNOSIS — M25561 Pain in right knee: Secondary | ICD-10-CM

## 2014-12-26 DIAGNOSIS — M24661 Ankylosis, right knee: Secondary | ICD-10-CM

## 2014-12-26 DIAGNOSIS — M25562 Pain in left knee: Secondary | ICD-10-CM | POA: Diagnosis not present

## 2014-12-26 DIAGNOSIS — M24662 Ankylosis, left knee: Secondary | ICD-10-CM

## 2014-12-26 DIAGNOSIS — R29898 Other symptoms and signs involving the musculoskeletal system: Secondary | ICD-10-CM

## 2014-12-26 NOTE — Therapy (Signed)
[facility] Outpatient Rehabilitation [facility] [address] [place], [place], [address] Phone: [phone]   Fax:  [phone]  Physical Therapy Treatment  Patient Details  Name: [name] MRN: [id number] Date of Birth: 08-30-1940 Referring Provider:  [name], MD  Encounter Date: 12/26/2014      PT End of Session - 12/26/14 1736    Visit Number 5   Number of Visits 16   Date for PT Re-Evaluation 02/03/15   PT Start Time 1500   PT Stop Time 1555   PT Time Calculation (min) 55 min   Activity Tolerance Patient tolerated treatment well;Patient limited by pain      Past Medical History  Diagnosis Date  . Chronic back pain   . Hypertension   . Scoliosis   . Hyperlipemia   . Arthritis     Past Surgical History  Procedure Laterality Date  . Oophorectomy  1970  . Tubal ligation  1983  . Arthroscopic repair acl  02/03/2004  . Spine surgery  03/31/2006    There were no vitals filed for this visit.  Visit Diagnosis:  Bilateral knee pain  Decreased range of knee movement, right  Decreased range of knee movement, left  Weakness of both legs      Subjective Assessment - 12/26/14 1524    Subjective 8/10 pain.  She went to church an dthen shopping yesterday. 2 stores 1 stand ing 15, other standing 30 minutes.   Patient is accompained by: Family member   Currently in Pain? Yes   Pain Score 8                          [facility] Adult PT Treatment/Exercise - 12/26/14 1417    Posture/Postural Control   Posture Comments Pillow between legs in sidelying very comfortable.   Knee/Hip Exercises: Stretches   Passive Hamstring Stretch 3 reps;30 seconds   Knee/Hip Exercises: Aerobic   Stationary Bike Nu step 7 minutes 300 steps. L4   Knee/Hip Exercises: Standing   Wall Squat --  4 reps, small motions  10/10 pain with no noticible    Knee/Hip Exercises: Seated   Other Seated Knee Exercises Band yellow, knee flexion, 10 reps with  instruction.  added to home exercise.  able to do without increased pain.   Knee/Hip Exercises: Supine   Quad Sets Limitations isometric quad sets 10 reps 5 second hold   Heel Slides 10 reps   Heel Slides Limitations Isometric hamstrings 10 reps 5 second holds.   Other Supine Knee Exercises Sidelying clams 10 reps 5 AA, hip stiff, small motions only.   Manual Therapy   Manual Therapy Taping   Kinesiotix   Edema Medial/Lateral knee  2 fans, no tension   Inhibit Muscle  distal leg, anterior, "Y"  off the paper tension   Facilitate Muscle  Quads, off the paper tension                PT Education - 12/26/14 1735    Education provided Yes   Education Details hamstring, band   Person(s) Educated Patient;Other (comment)  grandaughter   Methods Explanation;Demonstration;Tactile cues;Handout   Comprehension Verbalized understanding;Returned demonstration          PT Short Term Goals - 12/26/14 1742    PT SHORT TERM GOAL #1   Title pt will be I with basic HEP 01/08/2015   Baseline modified independent   Time 4   Period Weeks   Status  On-going   PT SHORT TERM GOAL #2   Title pt will increase bil knee AROM to -5 / 125 to assist with functional and efficient gait 01/08/2015   Time 4   Period Weeks   Status On-going   PT SHORT TERM GOAL #3   Title she will demonsrate increased strength to >3/5 to help with exercise progression and safety during ambulation 01/08/2015   Time 4   Period Weeks   Status On-going   PT SHORT TERM GOAL #4   Title she will be able to stand/ walk with <7/10 for >10 minutes to help promote endurance to assist with ADLs 01/08/2015   Baseline went shopping 2 stores 30 minutes one store and 15 minutes the next   Time 4   Period Weeks   Status Achieved   PT SHORT TERM GOAL #5   Title pt will be able to verbalize and demonstrate techniques to control pain and inflammation via RICE method 01/08/2015   Time 4   Period Weeks   Status On-going            PT Long Term Goals - 12/09/14 1230    PT LONG TERM GOAL #1   Title pt will be I with advanced HEP 02/03/2015   Time 8   Period Weeks   Status New   PT LONG TERM GOAL #2   Title pt will increase bil knee strength to > 4-/5 to help with weight bearing endurance and safety with ambulation 02/03/2015   Time 8   Period Weeks   Status New   PT LONG TERM GOAL #3   Title She will demonstate bil knee AROM of greater than -2 to 128 to assist with gait efficency to promote energy conservation 02/03/2015   Time 8   Period Weeks   Status New   PT LONG TERM GOAL #4   Title She will be able to tolerated standing and walking for >20 minutes and/or >10 steps with <4/10 pain to help endurances and gait safety 02/03/2015   Time 8   Period Weeks   Status New   PT LONG TERM GOAL #5   Title She will be able to verbalize and demonstrate techniques to reduce risk or bil knee reinjury via postural awareness, ambulation with LRAD, and HEP 02/03/2015   Time 8   Period Weeks   Status New               Plan - 12/26/14 1737    Clinical Impression Statement No pain at end of session.    Patient appears to be moving better with continued pain.  She has decided on an unloader brace from [facility].   PT Next Visit Plan quad, anterior hip stretches, add calf stretch to home,continue manual, Fax order to MD for unloader for Lt knee   Consulted and Agree with Plan of Care Patient;Family member/caregiver   Family Member Consulted granddaughter        Problem List There are no active problems to display for this patient.   [facility] 12/26/2014, 5:45 PM  [facility] [address] [place], [place], [address] Phone: [phone]   Fax:  [phone]   Liz Beach, PTA 12/26/2014 5:45 PM Phone: [phone] Fax: [phone]

## 2014-12-28 ENCOUNTER — Ambulatory Visit: Payer: Medicare Other | Admitting: Physical Therapy

## 2014-12-28 DIAGNOSIS — M25561 Pain in right knee: Secondary | ICD-10-CM

## 2014-12-28 DIAGNOSIS — M24661 Ankylosis, right knee: Secondary | ICD-10-CM

## 2014-12-28 DIAGNOSIS — R293 Abnormal posture: Secondary | ICD-10-CM

## 2014-12-28 DIAGNOSIS — R269 Unspecified abnormalities of gait and mobility: Secondary | ICD-10-CM

## 2014-12-28 DIAGNOSIS — M25562 Pain in left knee: Secondary | ICD-10-CM | POA: Diagnosis not present

## 2014-12-28 DIAGNOSIS — M24662 Ankylosis, left knee: Secondary | ICD-10-CM

## 2014-12-28 DIAGNOSIS — R29898 Other symptoms and signs involving the musculoskeletal system: Secondary | ICD-10-CM

## 2014-12-28 NOTE — Therapy (Signed)
[facility] Outpatient Rehabilitation [facility] [address] [place], [place], [address] Phone: [phone]   Fax:  [phone]  Physical Therapy Treatment  Patient Details  Name: [name] MRN: [id number] Date of Birth: 1941/03/04 Referring Provider:  [name], MD  Encounter Date: 12/28/2014      PT End of Session - 12/28/14 1249    Visit Number 6   Number of Visits 16   Date for PT Re-Evaluation 02/03/15   PT Start Time 1100   PT Stop Time 1148   PT Time Calculation (min) 48 min   Activity Tolerance Patient tolerated treatment well   Behavior During Therapy [facility] for tasks assessed/performed      Past Medical History  Diagnosis Date  . Chronic back pain   . Hypertension   . Scoliosis   . Hyperlipemia   . Arthritis     Past Surgical History  Procedure Laterality Date  . Oophorectomy  1970  . Tubal ligation  1983  . Arthroscopic repair acl  02/03/2004  . Spine surgery  03/31/2006    There were no vitals filed for this visit.  Visit Diagnosis:  Bilateral knee pain  Decreased range of knee movement, right  Decreased range of knee movement, left  Weakness of both legs  Abnormality of gait  Posture abnormality      Subjective Assessment - 12/28/14 1112    Subjective reports a 7/10 when she first came in but doesn't feel it after starting nu step. She reports that she hsa a retreat coming up this weekend and wants to get taped again.    Currently in Pain? Yes   Pain Score 7    Pain Location Knee   Pain Orientation Left   Pain Descriptors / Indicators Aching   Pain Type Chronic pain;Acute pain   Pain Onset More than a month ago   Pain Frequency Intermittent   Aggravating Factors  prolong sitting/ walking   Pain Relieving Factors bending knee and stretching            [facility] PT Assessment - 12/28/14 0001    AROM   Left Knee Extension -5   Left Knee Flexion 125                     [facility] Adult PT  Treatment/Exercise - 12/28/14 1115    Knee/Hip Exercises: Aerobic   Stationary Bike Nu step 7 minutes  steps. L2   Knee/Hip Exercises: Seated   Long Arc Quad AROM;Strengthening;Both;1 set;10 reps  2#   Other Seated Knee Exercises Band yellow, knee flexion, 10 reps with instruction.  added to home exercise.  able to do without increased pain.   Knee/Hip Exercises: Supine   Heel Slides 10 reps   Heel Slides Limitations Isometric hamstrings 10 reps 5 second holds.   Bridges AROM;Strengthening;Both;1 set;5 reps  vc for glute set   Straight Leg Raises Strengthening;Right;Left;1 set;10 reps  with quad set, demonstrated increased fatigue   Other Supine Knee Exercises supine clams 2 x 5   with red theraband   Manual Therapy   Manual Therapy Taping   Kinesiotex Facilitate Muscle   Kinesiotix   Edema Medial/Lateral knee   Inhibit Muscle  distal leg, anterior, "Y"   Facilitate Muscle  Quads, off the paper tension                  PT Short Term Goals - 12/26/14 1742    PT SHORT TERM GOAL #1  Title pt will be I with basic HEP 01/08/2015   Baseline modified independent   Time 4   Period Weeks   Status On-going   PT SHORT TERM GOAL #2   Title pt will increase bil knee AROM to -5 / 125 to assist with functional and efficient gait 01/08/2015   Time 4   Period Weeks   Status On-going   PT SHORT TERM GOAL #3   Title she will demonsrate increased strength to >3/5 to help with exercise progression and safety during ambulation 01/08/2015   Time 4   Period Weeks   Status On-going   PT SHORT TERM GOAL #4   Title she will be able to stand/ walk with <7/10 for >10 minutes to help promote endurance to assist with ADLs 01/08/2015   Baseline went shopping 2 stores 30 minutes one store and 15 minutes the next   Time 4   Period Weeks   Status Achieved   PT SHORT TERM GOAL #5   Title pt will be able to verbalize and demonstrate techniques to control pain and inflammation via RICE method  01/08/2015   Time 4   Period Weeks   Status On-going           PT Long Term Goals - 12/09/14 1230    PT LONG TERM GOAL #1   Title pt will be I with advanced HEP 02/03/2015   Time 8   Period Weeks   Status New   PT LONG TERM GOAL #2   Title pt will increase bil knee strength to > 4-/5 to help with weight bearing endurance and safety with ambulation 02/03/2015   Time 8   Period Weeks   Status New   PT LONG TERM GOAL #3   Title She will demonstate bil knee AROM of greater than -2 to 128 to assist with gait efficency to promote energy conservation 02/03/2015   Time 8   Period Weeks   Status New   PT LONG TERM GOAL #4   Title She will be able to tolerated standing and walking for >20 minutes and/or >10 steps with <4/10 pain to help endurances and gait safety 02/03/2015   Time 8   Period Weeks   Status New   PT LONG TERM GOAL #5   Title She will be able to verbalize and demonstrate techniques to reduce risk or bil knee reinjury via postural awareness, ambulation with LRAD, and HEP 02/03/2015   Time 8   Period Weeks   Status New               Plan - 12/28/14 1249    Clinical Impression Statement Mykia reported pain noted at 7/10 which she reported felt better after the Nu Step after 10 minutes to help improve her endurance.  She is able to actively perform SLR to get her leg onto the table without assistance which is an improvement since the last time I saw her at initial evalution. she has improved her AROM to -5 to 125 degrees which she reports she has noticed it getting better.    PT Next Visit Plan quad, anterior hip stretches, add calf stretch to home,continue manual, Fax order to MD for unloader for Lt knee   Consulted and Agree with Plan of Care Patient;Family member/caregiver   Family Member Consulted granddaughter        Problem List There are no active problems to display for this patient.  Shantanu Strauch PT, DPT, LAT, ATC  12/28/2014  12:53  PM    [facility] Outpatient Rehabilitation [facility] [address] [place], [place], [address] Phone: [phone]   Fax:  [phone]

## 2015-01-02 ENCOUNTER — Ambulatory Visit: Payer: Medicare Other | Admitting: Physical Therapy

## 2015-01-02 DIAGNOSIS — R29898 Other symptoms and signs involving the musculoskeletal system: Secondary | ICD-10-CM

## 2015-01-02 DIAGNOSIS — M25561 Pain in right knee: Secondary | ICD-10-CM

## 2015-01-02 DIAGNOSIS — M25562 Pain in left knee: Principal | ICD-10-CM

## 2015-01-02 DIAGNOSIS — M24661 Ankylosis, right knee: Secondary | ICD-10-CM

## 2015-01-02 DIAGNOSIS — R269 Unspecified abnormalities of gait and mobility: Secondary | ICD-10-CM

## 2015-01-02 DIAGNOSIS — M24662 Ankylosis, left knee: Secondary | ICD-10-CM

## 2015-01-02 NOTE — Therapy (Signed)
[facility] Outpatient Rehabilitation [facility] [address] [place], [place], [address] Phone: [phone]   Fax:  [phone]  Physical Therapy Treatment  Patient Details  Name: [name] MRN: [id number] Date of Birth: 08-09-1941 Referring Provider:  [name], MD  Encounter Date: 01/02/2015      PT End of Session - 01/02/15 1318    Visit Number 7   Number of Visits 16   Date for PT Re-Evaluation 02/03/15   PT Start Time 1154   PT Stop Time 1250   PT Time Calculation (min) 56 min   Activity Tolerance Patient tolerated treatment well   Behavior During Therapy [facility] for tasks assessed/performed      Past Medical History  Diagnosis Date  . Chronic back pain   . Hypertension   . Scoliosis   . Hyperlipemia   . Arthritis     Past Surgical History  Procedure Laterality Date  . Oophorectomy  1970  . Tubal ligation  1983  . Arthroscopic repair acl  02/03/2004  . Spine surgery  03/31/2006    There were no vitals filed for this visit.  Visit Diagnosis:  Bilateral knee pain  Decreased range of knee movement, right  Decreased range of knee movement, left  Weakness of both legs  Abnormality of gait      Subjective Assessment - 01/02/15 1156    Subjective On her feet more than usual and she did better than she thought.  Sore today, not pain.Back is not bothering her today.   Currently in Pain? No/denies   Pain Score 0-No pain   Pain Location Knee   Pain Orientation Right;Left   Pain Descriptors / Indicators Sore   Pain Frequency Intermittent   Aggravating Factors  walking   Pain Relieving Factors PT  , rest   Effect of Pain on Daily Activities none mentioned   Multiple Pain Sites No                         [facility] Adult PT Treatment/Exercise - 01/02/15 1200    Knee/Hip Exercises: Stretches   Gastroc Stretch 3 reps;30 seconds  both on 2 inch step   Knee/Hip Exercises: Standing   Heel Raises 10 reps   Knee Flexion  Both;1 set;10 reps   Functional Squat 10 reps  very small squats, felt good to her back   Other Standing Knee Exercises Hip extension leaning over high table 2 sets of 10   Other Standing Knee Exercises Side step to Rt to Lt  close SBA   Knee/Hip Exercises: Seated   Other Seated Knee Exercises Band RED progression 10 reps 2 sets each   Knee/Hip Exercises: Supine   Short Arc Quad Sets Limitations 5 LBS AA, Extension with 5 second holds and eccentric lowering   Manual Therapy   Manual Therapy Taping   Kinesiotex Facilitate Muscle   Kinesiotix   Edema Medial/Lateral knee   Inhibit Muscle  distal leg, anterior, "Y"   Facilitate Muscle  Quads, off the paper tension                  PT Short Term Goals - 01/02/15 1322    PT SHORT TERM GOAL #1   Title pt will be I with basic HEP 01/08/2015   Baseline modified independent   Time 4   Period Weeks   Status On-going   PT SHORT TERM GOAL #2   Title pt will increase bil knee AROM  to -5 / 125 to assist with functional and efficient gait 01/08/2015   Time 4   Status Unable to assess   PT SHORT TERM GOAL #3   Title she will demonsrate increased strength to >3/5 to help with exercise progression and safety during ambulation 01/08/2015   Time 4   Period Weeks   Status On-going   PT SHORT TERM GOAL #4   Title she will be able to stand/ walk with <7/10 for >10 minutes to help promote endurance to assist with ADLs 01/08/2015   Time 4   Period Weeks   PT SHORT TERM GOAL #5   Title pt will be able to verbalize and demonstrate techniques to control pain and inflammation via RICE method 01/08/2015   Time 4   Period Weeks   Status Achieved           PT Long Term Goals - 01/02/15 1324    PT LONG TERM GOAL #1   Title pt will be I with advanced HEP 02/03/2015   Time 8   Period Weeks   Status On-going   PT LONG TERM GOAL #2   Title pt will increase bil knee strength to > 4-/5 to help with weight bearing endurance and safety with  ambulation 02/03/2015   Time 8   Period Weeks   Status On-going   PT LONG TERM GOAL #3   Title She will demonstate bil knee AROM of greater than -2 to 128 to assist with gait efficency to promote energy conservation 02/03/2015   Time 8   Period Weeks   Status On-going   PT LONG TERM GOAL #4   Title She will be able to tolerated standing and walking for >20 minutes and/or >10 steps with <4/10 pain to help endurances and gait safety 02/03/2015   Baseline 30 minutes on her feet, no pain   Time 8   Period Weeks   Status Achieved   PT LONG TERM GOAL #5   Title She will be able to verbalize and demonstrate techniques to reduce risk or bil knee reinjury via postural awareness, ambulation with LRAD, and HEP 02/03/2015   Time 8   Period Weeks   Status On-going               Plan - 01/02/15 1319    Clinical Impression Statement More active over the weekend.  Able to tolerate 30 minutes of being on her feet with no pain.  Progress toward that goal.   PT Next Visit Plan more closed chain, ask if appointment has been made for unloader.   Consulted and Agree with Plan of Care Patient;Family member/caregiver   Family Member Consulted granddaughter        Problem List There are no active problems to display for this patient.   [facility] 01/02/2015, 1:26 PM  [facility] [address] [place], [place], [address] Phone: [phone]   Fax:  [phone]   Liz Beach, PTA 01/02/2015 1:26 PM Phone: [phone] Fax: [phone]

## 2015-01-04 ENCOUNTER — Ambulatory Visit: Payer: Medicare Other | Admitting: Physical Therapy

## 2015-01-05 ENCOUNTER — Ambulatory Visit: Payer: Medicare Other | Admitting: Physical Therapy

## 2015-01-05 DIAGNOSIS — M25561 Pain in right knee: Secondary | ICD-10-CM

## 2015-01-05 DIAGNOSIS — M24662 Ankylosis, left knee: Secondary | ICD-10-CM

## 2015-01-05 DIAGNOSIS — R293 Abnormal posture: Secondary | ICD-10-CM

## 2015-01-05 DIAGNOSIS — M25562 Pain in left knee: Secondary | ICD-10-CM | POA: Diagnosis not present

## 2015-01-05 DIAGNOSIS — R29898 Other symptoms and signs involving the musculoskeletal system: Secondary | ICD-10-CM

## 2015-01-05 DIAGNOSIS — M24661 Ankylosis, right knee: Secondary | ICD-10-CM

## 2015-01-05 NOTE — Therapy (Signed)
[facility] Outpatient Rehabilitation [facility] [address] [place], [place], [address] Phone: [phone]   Fax:  [phone]  Physical Therapy Treatment  Patient Details  Name: Sarah TAY MRN: [id number] Date of Birth: Feb 26, 1941 Referring Provider:  [name], MD  Encounter Date: 01/05/2015      PT End of Session - 01/05/15 1141    Visit Number 8   Number of Visits 16   Date for PT Re-Evaluation 02/03/15   PT Start Time 0850   PT Stop Time 0945   PT Time Calculation (min) 55 min   Activity Tolerance Patient tolerated treatment well;Patient limited by pain   Behavior During Therapy [facility] for tasks assessed/performed      Past Medical History  Diagnosis Date  . Chronic back pain   . Hypertension   . Scoliosis   . Hyperlipemia   . Arthritis     Past Surgical History  Procedure Laterality Date  . Oophorectomy  1970  . Tubal ligation  1983  . Arthroscopic repair acl  02/03/2004  . Spine surgery  03/31/2006    There were no vitals filed for this visit.  Visit Diagnosis:  Bilateral knee pain  Decreased range of knee movement, right  Decreased range of knee movement, left  Weakness of both legs  Posture abnormality      Subjective Assessment - 01/05/15 0858    Subjective I have a pep in my step today.  Missed last session due to a funeral.  Has ben measured for her unloader brace.   Pain Score 6    Pain Location Knee   Pain Orientation Right;Lateral   Pain Descriptors / Indicators Sore   Pain Type Chronic pain   Pain Frequency Intermittent   Aggravating Factors  walking, moving around   Pain Relieving Factors PT, REST, Weekend retreat   Multiple Pain Sites No                         [facility] Adult PT Treatment/Exercise - 01/05/15 0900    High Level Balance   High Level Balance Activities --  5 SLR kicks ) hands SBA, each   Knee/Hip Exercises: Stretches   Hip Flexor Stretch --  1 rep sidelying each   Knee/Hip Exercises: Aerobic   Stationary Bike Nu step, 5 minutes L5   Knee/Hip Exercises: Standing   Heel Raises 10 reps  toe lift 10 reps   Knee Flexion Both;2 sets  yellow band, seated   Forward Step Up --  5 rt 1 lt limited by pain   SLS with Vectors counter kicks, 5 reps  5 reps, painful for weight bearing leg   Other Standing Knee Exercises hip extension 10 reps 2 sets   Knee/Hip Exercises: Sidelying   Clams 2sets   Moist Heat Therapy   Number Minutes Moist Heat 15 Minutes   Moist Heat Location --  Posterior Lt thigh, anterior both knees                  PT Short Term Goals - 01/02/15 1322    PT SHORT TERM GOAL #1   Title pt will be I with basic HEP 01/08/2015   Baseline modified independent   Time 4   Period Weeks   Status On-going   PT SHORT TERM GOAL #2   Title pt will increase bil knee AROM to -5 / 125 to assist with functional and efficient gait 01/08/2015   Time 4  Status Unable to assess   PT SHORT TERM GOAL #3   Title she will demonsrate increased strength to >3/5 to help with exercise progression and safety during ambulation 01/08/2015   Time 4   Period Weeks   Status On-going   PT SHORT TERM GOAL #4   Title she will be able to stand/ walk with <7/10 for >10 minutes to help promote endurance to assist with ADLs 01/08/2015   Time 4   Period Weeks   PT SHORT TERM GOAL #5   Title pt will be able to verbalize and demonstrate techniques to control pain and inflammation via RICE method 01/08/2015   Time 4   Period Weeks   Status Achieved           PT Long Term Goals - 01/02/15 1324    PT LONG TERM GOAL #1   Title pt will be I with advanced HEP 02/03/2015   Time 8   Period Weeks   Status On-going   PT LONG TERM GOAL #2   Title pt will increase bil knee strength to > 4-/5 to help with weight bearing endurance and safety with ambulation 02/03/2015   Time 8   Period Weeks   Status On-going   PT LONG TERM GOAL #3   Title She will demonstate bil  knee AROM of greater than -2 to 128 to assist with gait efficency to promote energy conservation 02/03/2015   Time 8   Period Weeks   Status On-going   PT LONG TERM GOAL #4   Title She will be able to tolerated standing and walking for >20 minutes and/or >10 steps with <4/10 pain to help endurances and gait safety 02/03/2015   Baseline 30 minutes on her feet, no pain   Time 8   Period Weeks   Status Achieved   PT LONG TERM GOAL #5   Title She will be able to verbalize and demonstrate techniques to reduce risk or bil knee reinjury via postural awareness, ambulation with LRAD, and HEP 02/03/2015   Time 8   Period Weeks   Status On-going               Plan - 01/05/15 1142    Clinical Impression Statement strengthening and stretching today.  Mobility improving.  Steps continue to be painful.   PT Next Visit Plan more closed chain And hip strengthening   Consulted and Agree with Plan of Care Patient        Problem List There are no active problems to display for this patient.   [facility] 01/05/2015, 11:46 AM  [facility] [address] [place], [place], [address] Phone: [phone]   Fax:  [phone]  Liz Beach, PTA 01/05/2015 11:46 AM Phone: [phone] Fax: [phone]

## 2015-01-10 ENCOUNTER — Ambulatory Visit: Payer: Medicare Other | Admitting: Physical Therapy

## 2015-01-12 ENCOUNTER — Ambulatory Visit: Payer: Medicare Other | Attending: Internal Medicine | Admitting: Physical Therapy

## 2015-01-12 DIAGNOSIS — M25561 Pain in right knee: Secondary | ICD-10-CM | POA: Diagnosis not present

## 2015-01-12 DIAGNOSIS — R29898 Other symptoms and signs involving the musculoskeletal system: Secondary | ICD-10-CM | POA: Diagnosis present

## 2015-01-12 DIAGNOSIS — R269 Unspecified abnormalities of gait and mobility: Secondary | ICD-10-CM | POA: Diagnosis present

## 2015-01-12 DIAGNOSIS — R293 Abnormal posture: Secondary | ICD-10-CM | POA: Diagnosis present

## 2015-01-12 DIAGNOSIS — M24661 Ankylosis, right knee: Secondary | ICD-10-CM | POA: Insufficient documentation

## 2015-01-12 DIAGNOSIS — M24662 Ankylosis, left knee: Secondary | ICD-10-CM | POA: Insufficient documentation

## 2015-01-12 DIAGNOSIS — M25562 Pain in left knee: Secondary | ICD-10-CM | POA: Diagnosis present

## 2015-01-12 NOTE — Therapy (Signed)
[place], [place], [address] Phone: [phone]   Fax:  [phone]  Physical Therapy Treatment  Patient Details  Name: [name] MRN: [id number] Date of Birth: 1940-11-01 Referring Provider:  Nolene Ebbs, MD  Encounter Date: 01/12/2015      PT End of Session - 01/12/15 1308    Visit Number 9   Number of Visits 16   Date for PT Re-Evaluation 02/03/15   PT Start Time 1200   PT Stop Time 1230   PT Time Calculation (min) 30 min      Past Medical History  Diagnosis Date  . Chronic back pain   . Hypertension   . Scoliosis   . Hyperlipemia   . Arthritis     Past Surgical History  Procedure Laterality Date  . Oophorectomy  1970  . Tubal ligation  1983  . Arthroscopic repair acl  02/03/2004  . Spine surgery  03/31/2006    There were no vitals filed for this visit.  Visit Diagnosis:  Bilateral knee pain  Decreased range of knee movement, right  Decreased range of knee movement, left  Weakness of both legs  Posture abnormality  Abnormality of gait      Subjective Assessment - 01/12/15 1201    Currently in Pain? Yes   Pain Score 5    Pain Location Knee   Pain Orientation Right   Aggravating Factors  walking, on feet   Pain Relieving Factors rest, exercises   Pain Score 7   Pain Location Knee   Pain Orientation Left   Aggravating Factors  walking   Pain Relieving Factors rest, exercises            [facility] PT Assessment - 01/12/15 1220    Strength   Right Knee Flexion 4/5   Right Knee Extension 4-/5   Left Knee Flexion 4-/5   Left Knee Extension 3+/5                     [facility] Adult PT Treatment/Exercise - 01/12/15 1208    Knee/Hip Exercises: Stretches   Gastroc Stretch 3 reps;30 seconds  both on 2 inch step   Knee/Hip Exercises: Standing   Heel Raises 2 sets;10 reps   Heel Raises Limitations also toe raises 10 x 2   Knee Flexion AROM;10 reps   SLS with Vectors 3 way x 10 each way bilateral   Other Standing Knee Exercises sit-stand x 5 from elevated seat   Knee/Hip Exercises: Seated   Long Arc Quad Both;2 sets;10 reps   Other Seated Knee Exercises Seated hip flexion 3# left 8x2, 5 # right 10 x 2                  PT Short Term Goals - 01/12/15 1225    PT SHORT TERM GOAL #1   Title pt will be I with basic HEP 01/08/2015   Time 4   Period Weeks   Status Achieved   PT SHORT TERM GOAL #2   Title pt will increase bil knee AROM to -5 / 125 to assist with functional and efficient gait 01/08/2015   Time 4   Period Weeks   Status On-going   PT SHORT TERM GOAL #3   Title she will demonsrate increased strength to >3/5 to help with exercise progression and safety during ambulation 01/08/2015   Time 4   Period Weeks   Status Achieved   PT SHORT TERM  GOAL #4   Title she will be able to stand/ walk with <7/10 for >10 minutes to help promote endurance to assist with ADLs 01/08/2015   Time 4   Period Weeks   Status Achieved   PT SHORT TERM GOAL #5   Title pt will be able to verbalize and demonstrate techniques to control pain and inflammation via RICE method 01/08/2015   Time 4   Period Weeks   Status Achieved           PT Long Term Goals - 01/02/15 1324    PT LONG TERM GOAL #1   Title pt will be I with advanced HEP 02/03/2015   Time 8   Period Weeks   Status On-going   PT LONG TERM GOAL #2   Title pt will increase bil knee strength to > 4-/5 to help with weight bearing endurance and safety with ambulation 02/03/2015   Time 8   Period Weeks   Status On-going   PT LONG TERM GOAL #3   Title She will demonstate bil knee AROM of greater than -2 to 128 to assist with gait efficency to promote energy conservation 02/03/2015   Time 8   Period Weeks   Status On-going   PT LONG TERM GOAL #4   Title She will be able to tolerated standing and walking for >20 minutes and/or >10 steps with <4/10 pain to help endurances and gait  safety 02/03/2015   Baseline 30 minutes on her feet, no pain   Time 8   Period Weeks   Status Achieved   PT LONG TERM GOAL #5   Title She will be able to verbalize and demonstrate techniques to reduce risk or bil knee reinjury via postural awareness, ambulation with LRAD, and HEP 02/03/2015   Time 8   Period Weeks   Status On-going               Plan - 01/12/15 1305    Clinical Impression Statement Pt instructed in closed chain hip and knee strengthening to work toward mobility goals. Pt knee strength has improved from initially 2-/5 bilateral. Pt reports she is waiting on the company to call her when her unloader brace arrives. The left knee continues to be more painful as well as more weakness in LLE. STG# 1,3,4,5 MET.    PT Next Visit Plan FOTO ,check hip mmt, continue functional strengthening as well as PRE        Problem List There are no active problems to display for this patient.   Dorene Ar, [place] 01/12/2015, 1:09 PM  [facility] [address] [place], [place], [address] Phone: [phone]   Fax:  [phone]

## 2015-01-17 ENCOUNTER — Ambulatory Visit: Payer: Medicare Other | Admitting: Podiatry

## 2015-01-19 ENCOUNTER — Encounter: Payer: Self-pay | Admitting: Podiatry

## 2015-01-19 ENCOUNTER — Ambulatory Visit (INDEPENDENT_AMBULATORY_CARE_PROVIDER_SITE_OTHER): Payer: Medicare Other | Admitting: Podiatry

## 2015-01-19 ENCOUNTER — Ambulatory Visit: Payer: Medicare Other | Admitting: Physical Therapy

## 2015-01-19 VITALS — BP 169/65 | HR 48 | Temp 99.5°F | Resp 16

## 2015-01-19 DIAGNOSIS — B351 Tinea unguium: Secondary | ICD-10-CM | POA: Diagnosis not present

## 2015-01-19 DIAGNOSIS — M25561 Pain in right knee: Secondary | ICD-10-CM

## 2015-01-19 DIAGNOSIS — M79676 Pain in unspecified toe(s): Secondary | ICD-10-CM | POA: Diagnosis not present

## 2015-01-19 DIAGNOSIS — R29898 Other symptoms and signs involving the musculoskeletal system: Secondary | ICD-10-CM

## 2015-01-19 DIAGNOSIS — M24661 Ankylosis, right knee: Secondary | ICD-10-CM

## 2015-01-19 DIAGNOSIS — M25562 Pain in left knee: Principal | ICD-10-CM

## 2015-01-19 DIAGNOSIS — R293 Abnormal posture: Secondary | ICD-10-CM

## 2015-01-19 DIAGNOSIS — R269 Unspecified abnormalities of gait and mobility: Secondary | ICD-10-CM

## 2015-01-19 DIAGNOSIS — M24662 Ankylosis, left knee: Secondary | ICD-10-CM

## 2015-01-19 NOTE — Progress Notes (Signed)
She presents today with chief complaint of painful elongated toenails.  Objective: Pulses are strongly palpable bilateral. Hammertoe deformities bilateral. Nails are thick yellow dystrophic clinic for mycotic and severely incurvated bilateral.  Assessment: Pain in limb secondary to onychomycosis and onychocryptosis bilateral.  Plan: Debridement of nails 1 through 5 bilateral covered service secondary to pain.

## 2015-01-19 NOTE — Therapy (Signed)
[place], [place], [address] Phone: [phone]   Fax:  [phone]  Physical Therapy Treatment  Patient Details  Name: [name] MRN: [id number] Date of Birth: November 01, 1940 Referring Provider:  Nolene Ebbs, MD  Encounter Date: 01/19/2015      PT End of Session - 01/19/15 1105    Visit Number 10   Number of Visits 16   Date for PT Re-Evaluation 02/03/15   PT Start Time 1100   PT Stop Time 1145   PT Time Calculation (min) 45 min   Activity Tolerance Patient tolerated treatment well   Behavior During Therapy [facility] for tasks assessed/performed      Past Medical History  Diagnosis Date  . Chronic back pain   . Hypertension   . Scoliosis   . Hyperlipemia   . Arthritis     Past Surgical History  Procedure Laterality Date  . Oophorectomy  1970  . Tubal ligation  1983  . Arthroscopic repair acl  02/03/2004  . Spine surgery  03/31/2006    There were no vitals filed for this visit.  Visit Diagnosis:  Bilateral knee pain  Decreased range of knee movement, right  Decreased range of knee movement, left  Weakness of both legs  Posture abnormality  Abnormality of gait      Subjective Assessment - 01/19/15 1106    Subjective " I have beend doing better today and have no pain in my knees"   Currently in Pain? Yes   Pain Score 0-No pain            [facility] PT Assessment - 01/19/15 0001    Observation/Other Assessments   Focus on Therapeutic Outcomes (FOTO)  70% limited   AROM   Right Knee Extension -2   Right Knee Flexion 129   Left Knee Extension -1   Left Knee Flexion 132   Strength   Right Knee Flexion 4/5   Right Knee Extension 4/5   Left Knee Flexion 4/5   Left Knee Extension 4/5                     [facility] Adult PT Treatment/Exercise - 01/19/15 1107    Knee/Hip Exercises: Aerobic   Stationary Bike Nu step, 5 minutes L5   Knee/Hip Exercises: Standing   Lateral Step Up 3 sets;Step Height: 2";Step Height: 4";Hand Hold: 2;Hand Hold: 0  2 sets with 4 inch 1 x with HHA, 1 x without   Knee/Hip Exercises: Seated   Long Arc Quad Both;2 sets;10 reps  2 1/2 #   Other Seated Knee Exercises sit to stand x 8  from high low table, slightly elevated   Other Seated Knee Exercises seated marching 2 x 10  2 1/2 #                PT Education - 01/19/15 1154    Education provided Yes   Education Details HEP review, education on progression to closed kinetic chain activity and balance training.    Person(s) Educated Patient   Methods Explanation   Comprehension Verbalized understanding          PT Short Term Goals - 01/19/15 1156    PT SHORT TERM GOAL #1   Title pt will be I with basic HEP 01/08/2015   Baseline modified independent   Time 4   Period Weeks   Status Achieved   PT SHORT TERM GOAL #2   Title pt will  increase bil knee AROM to -5 / 125 to assist with functional and efficient gait 01/08/2015   Time 4   Period Weeks   Status Achieved   PT SHORT TERM GOAL #3   Title she will demonsrate increased strength to >3/5 to help with exercise progression and safety during ambulation 01/08/2015   Time 4   Period Weeks   Status Achieved   PT SHORT TERM GOAL #4   Title she will be able to stand/ walk with <7/10 for >10 minutes to help promote endurance to assist with ADLs 01/08/2015   Baseline went shopping 2 stores 30 minutes one store and 15 minutes the next   Time 4   Period Weeks   Status Achieved   PT SHORT TERM GOAL #5   Title pt will be able to verbalize and demonstrate techniques to control pain and inflammation via RICE method 01/08/2015   Baseline 1 tenique verbalized   Time 4   Period Weeks   Status Achieved           PT Long Term Goals - 02-08-2015 1156    PT LONG TERM GOAL #1   Title pt will be I with advanced HEP 02/03/2015   Time 8   Period Weeks   Status On-going   PT LONG TERM GOAL #2   Title pt will  increase bil knee strength to > 4-/5 to help with weight bearing endurance and safety with ambulation 02/03/2015   Time 8   Period Weeks   Status On-going   PT LONG TERM GOAL #3   Title She will demonstate bil knee AROM of greater than -2 to 128 to assist with gait efficency to promote energy conservation 02/03/2015   Time 8   Period Weeks   Status On-going   PT LONG TERM GOAL #4   Title She will be able to tolerated standing and walking for >20 minutes and/or >10 steps with <4/10 pain to help endurances and gait safety 02/03/2015   Baseline 30 minutes on her feet, no pain   Time 8   Period Weeks   Status Achieved   PT LONG TERM GOAL #5   Title She will be able to verbalize and demonstrate techniques to reduce risk or bil knee reinjury via postural awareness, ambulation with LRAD, and HEP 02/03/2015   Time 8   Period Weeks   Status On-going               Plan - 2015/02/08 1155    Clinical Impression Statement Amparo continues to make great progress with strength and AROM of bil knees. She increased her FOTO score to 30. she met all STG this visit. she tolerated all exercises well today and was able to perfrom sit to stand without using her hands, and was able to perform step-ups wtih 2in and 4 in step 2 sets with 1 x wihtout HHA. Plan to progress with closed kinetic chain strengthening and balance training.    PT Next Visit Plan  continue functional strengthening as well as PRE, begin balance training.    PT Home Exercise Plan HEP review   Consulted and Agree with Plan of Care Patient          G-Codes - 02/08/15 1159    Functional Assessment Tool Used FOTO 70% limited   Functional Limitation Mobility: Walking and moving around   Mobility: Walking and Moving Around Current Status ([id number]) At least 60 percent but less than 80 percent impaired, limited or  restricted   Mobility: Walking and Moving Around Goal Status [phone]) At least 40 percent but less than 60 percent impaired,  limited or restricted      Problem List There are no active problems to display for this patient.  [name] PT, DPT, LAT, ATC  01/19/2015  12:01 PM    [facility] [address] [place], [place], [address] Phone: [phone]   Fax:  [phone]

## 2015-01-24 ENCOUNTER — Ambulatory Visit: Payer: Medicare Other | Admitting: Physical Therapy

## 2015-01-24 DIAGNOSIS — M25561 Pain in right knee: Secondary | ICD-10-CM | POA: Diagnosis not present

## 2015-01-24 DIAGNOSIS — M25562 Pain in left knee: Principal | ICD-10-CM

## 2015-01-24 DIAGNOSIS — M24661 Ankylosis, right knee: Secondary | ICD-10-CM

## 2015-01-24 DIAGNOSIS — M24662 Ankylosis, left knee: Secondary | ICD-10-CM

## 2015-01-24 DIAGNOSIS — R29898 Other symptoms and signs involving the musculoskeletal system: Secondary | ICD-10-CM

## 2015-01-24 NOTE — Therapy (Signed)
[facility] [address] [place], [place], [address] Phone: [phone]   Fax:  [phone]  Physical Therapy Treatment  Patient Details  Name: [name] MRN: [id number] Date of Birth: 12-07-1940 Referring Provider:  [name], MD  Encounter Date: 01/24/2015      PT End of Session - 01/24/15 1728    Visit Number 11   Number of Visits 16   Date for PT Re-Evaluation 02/03/15   PT Start Time 1200   PT Stop Time 1243   PT Time Calculation (min) 43 min   Activity Tolerance Patient tolerated treatment well;Patient limited by fatigue   Behavior During Therapy [facility] for tasks assessed/performed      Past Medical History  Diagnosis Date  . Chronic back pain   . Hypertension   . Scoliosis   . Hyperlipemia   . Arthritis     Past Surgical History  Procedure Laterality Date  . Oophorectomy  1970  . Tubal ligation  1983  . Arthroscopic repair acl  02/03/2004  . Spine surgery  03/31/2006    There were no vitals filed for this visit.  Visit Diagnosis:  Bilateral knee pain  Decreased range of knee movement, right  Decreased range of knee movement, left  Weakness of both legs      Subjective Assessment - 01/24/15 1209    Subjective 7/10 now this am 10/10 knee pain.  Feels her legs are swollen more than usual, and she is not "Going" as much as she should.   Currently in Pain? Yes   Pain Score 7    Pain Location Knee   Pain Orientation Left;Right  Lt>  Rt   Pain Descriptors / Indicators Sore  swollen   Aggravating Factors  walkinf. standing   Pain Relieving Factors sleeping.  PT helps I usually feel better when I leave here.   Multiple Pain Sites No                         [facility] Adult PT Treatment/Exercise - 01/24/15 1200    Ambulation/Gait   Gait Comments Trunk more flexed during gait.   Knee/Hip Exercises: Aerobic   Stationary Bike Nu step, 5 minutes.   Knee/Hip Exercises: Standing    Lateral Step Up 1 set  Rt 1 set pain 7/10, 6 reps Lt 7/10 pain so stopped   Forward Step Up 10 reps  4 inch 10 reps each   Knee/Hip Exercises: Supine   Quad Sets 10 reps   Short Arc Quad Sets 10 reps   Heel Slides 10 reps  AA                  PT Short Term Goals - 01/19/15 1156    PT SHORT TERM GOAL #1   Title pt will be I with basic HEP 01/08/2015   Baseline modified independent   Time 4   Period Weeks   Status Achieved   PT SHORT TERM GOAL #2   Title pt will increase bil knee AROM to -5 / 125 to assist with functional and efficient gait 01/08/2015   Time 4   Period Weeks   Status Achieved   PT SHORT TERM GOAL #3   Title she will demonsrate increased strength to >3/5 to help with exercise progression and safety during ambulation 01/08/2015   Time 4   Period Weeks   Status Achieved   PT SHORT TERM GOAL #4   Title  she will be able to stand/ walk with <7/10 for >10 minutes to help promote endurance to assist with ADLs 01/08/2015   Baseline went shopping 2 stores 30 minutes one store and 15 minutes the next   Time 4   Period Weeks   Status Achieved   PT SHORT TERM GOAL #5   Title pt will be able to verbalize and demonstrate techniques to control pain and inflammation via RICE method 01/08/2015   Baseline 1 tenique verbalized   Time 4   Period Weeks   Status Achieved           PT Long Term Goals - 01/19/15 1156    PT LONG TERM GOAL #1   Title pt will be I with advanced HEP 02/03/2015   Time 8   Period Weeks   Status On-going   PT LONG TERM GOAL #2   Title pt will increase bil knee strength to > 4-/5 to help with weight bearing endurance and safety with ambulation 02/03/2015   Time 8   Period Weeks   Status On-going   PT LONG TERM GOAL #3   Title She will demonstate bil knee AROM of greater than -2 to 128 to assist with gait efficency to promote energy conservation 02/03/2015   Time 8   Period Weeks   Status On-going   PT LONG TERM GOAL #4   Title She  will be able to tolerated standing and walking for >20 minutes and/or >10 steps with <4/10 pain to help endurances and gait safety 02/03/2015   Baseline 30 minutes on her feet, no pain   Time 8   Period Weeks   Status Achieved   PT LONG TERM GOAL #5   Title She will be able to verbalize and demonstrate techniques to reduce risk or bil knee reinjury via postural awareness, ambulation with LRAD, and HEP 02/03/2015   Time 8   Period Weeks   Status On-going               Plan - 01/24/15 1728    Clinical Impression Statement Painful and slow today long car ride this am. exercises helped decrease her pain.  Only back pain at end of session.  knee pain much improved.  110 AROM   PT Next Visit Plan  continue functional strengthening as well as PRE, begin balance training.    Consulted and Agree with Plan of Care Patient   Family Member Consulted granddaughter        Problem List There are no active problems to display for this patient.   [facility] 01/24/2015, 5:31 PM  [facility] [address] [place], [place], [address] Phone: [phone]   Fax:  [phone]   Liz Beach, PTA 01/24/2015 5:31 PM Phone: [phone] Fax: [phone]

## 2015-01-26 ENCOUNTER — Ambulatory Visit: Payer: Medicare Other | Admitting: Physical Therapy

## 2015-01-26 ENCOUNTER — Encounter: Payer: Medicare Other | Admitting: Physical Therapy

## 2015-01-31 ENCOUNTER — Encounter: Payer: Medicare Other | Admitting: Physical Therapy

## 2015-02-02 ENCOUNTER — Ambulatory Visit: Payer: Medicare Other | Admitting: Physical Therapy

## 2015-02-02 DIAGNOSIS — R293 Abnormal posture: Secondary | ICD-10-CM

## 2015-02-02 DIAGNOSIS — M24662 Ankylosis, left knee: Secondary | ICD-10-CM

## 2015-02-02 DIAGNOSIS — M25561 Pain in right knee: Secondary | ICD-10-CM | POA: Diagnosis not present

## 2015-02-02 DIAGNOSIS — R269 Unspecified abnormalities of gait and mobility: Secondary | ICD-10-CM

## 2015-02-02 DIAGNOSIS — M24661 Ankylosis, right knee: Secondary | ICD-10-CM

## 2015-02-02 DIAGNOSIS — R29898 Other symptoms and signs involving the musculoskeletal system: Secondary | ICD-10-CM

## 2015-02-02 NOTE — Therapy (Signed)
[facility] [address] [place], [place], [address] Phone: [phone]   Fax:  [phone]  Physical Therapy Treatment  Patient Details  Name: [name] MRN: [id number] Date of Birth: 1941/04/13 Referring Provider:  [name], MD  Encounter Date: 02/02/2015      PT End of Session - 02/02/15 1500    Visit Number 12   Number of Visits 16   Date for PT Re-Evaluation 02/03/15   PT Start Time 1417   PT Stop Time 1500   PT Time Calculation (min) 43 min   Activity Tolerance Patient tolerated treatment well   Behavior During Therapy [facility] for tasks assessed/performed      Past Medical History  Diagnosis Date  . Chronic back pain   . Hypertension   . Scoliosis   . Hyperlipemia   . Arthritis     Past Surgical History  Procedure Laterality Date  . Oophorectomy  1970  . Tubal ligation  1983  . Arthroscopic repair acl  02/03/2004  . Spine surgery  03/31/2006    There were no vitals filed for this visit.  Visit Diagnosis:  Decreased range of knee movement, right  Decreased range of knee movement, left  Weakness of both legs  Posture abnormality  Abnormality of gait      Subjective Assessment - 02/02/15 1421    Subjective 4/10 feet , legs swelling from sitting too long   Currently in Pain? Yes   Pain Score 4    Pain Location Knee   Pain Orientation Left;Right   Pain Descriptors / Indicators --  sore swollen   Aggravating Factors  sitting too long   Pain Relieving Factors Sitting after walking                          [facility] Adult PT Treatment/Exercise - 02/02/15 1417    High Level Balance   High Level Balance Activities --  on 4 footed wobble board.   High Level Balance Comments contact guard, belt used for safety   Knee/Hip Exercises: Seated   Long Arc Quad Right;Left;1 set;10 reps;Weights   Long Arc Quad Weight 5 lbs.   Long Arc Quad Limitations eccentric Lt   Heel Slides  Limitations knee flexion updated band to green.  Issued for home 10 reps each   Other Seated Knee/Hip Exercises MMT Quads/hamstrings both 4/5                  PT Short Term Goals - 02/02/15 1501    PT SHORT TERM GOAL #2   Title pt will increase bil knee AROM to -5 / 125 to assist with functional and efficient gait 01/08/2015   Baseline RT 118 AROM,  -5 extension AAROM 127, Lt AA 118 -5    Period Weeks   Status On-going   PT SHORT TERM GOAL #3   Title she will demonsrate increased strength to >3/5 to help with exercise progression and safety during ambulation 01/08/2015   Baseline 4/5   Time 4   Status Achieved   PT SHORT TERM GOAL #5   Title pt will be able to verbalize and demonstrate techniques to control pain and inflammation via RICE method 01/08/2015   Status Achieved           PT Long Term Goals - 01/19/15 1156    PT LONG TERM GOAL #1   Title pt will be I with advanced HEP 02/03/2015  Time 8   Period Weeks   Status On-going   PT LONG TERM GOAL #2   Title pt will increase bil knee strength to > 4-/5 to help with weight bearing endurance and safety with ambulation 02/03/2015   Time 8   Period Weeks   Status On-going   PT LONG TERM GOAL #3   Title She will demonstate bil knee AROM of greater than -2 to 128 to assist with gait efficency to promote energy conservation 02/03/2015   Time 8   Period Weeks   Status On-going   PT LONG TERM GOAL #4   Title She will be able to tolerated standing and walking for >20 minutes and/or >10 steps with <4/10 pain to help endurances and gait safety 02/03/2015   Baseline 30 minutes on her feet, no pain   Time 8   Period Weeks   Status Achieved   PT LONG TERM GOAL #5   Title She will be able to verbalize and demonstrate techniques to reduce risk or bil knee reinjury via postural awareness, ambulation with LRAD, and HEP 02/03/2015   Time 8   Period Weeks   Status On-going               Problem List There are no  active problems to display for this patient.   [facility] 02/02/2015, 3:04 PM  [facility] [address] [place], [place], [address] Phone: [phone]   Fax:  [phone]  Liz Beach, PTA 02/02/2015 3:04 PM Phone: [phone] Fax: [phone]

## 2015-02-07 ENCOUNTER — Encounter (HOSPITAL_BASED_OUTPATIENT_CLINIC_OR_DEPARTMENT_OTHER): Payer: Medicare Other

## 2015-02-07 ENCOUNTER — Ambulatory Visit: Payer: Medicare Other | Admitting: Physical Therapy

## 2015-02-07 DIAGNOSIS — M25561 Pain in right knee: Secondary | ICD-10-CM

## 2015-02-07 DIAGNOSIS — R29898 Other symptoms and signs involving the musculoskeletal system: Secondary | ICD-10-CM

## 2015-02-07 DIAGNOSIS — R269 Unspecified abnormalities of gait and mobility: Secondary | ICD-10-CM

## 2015-02-07 DIAGNOSIS — M24661 Ankylosis, right knee: Secondary | ICD-10-CM

## 2015-02-07 DIAGNOSIS — M25562 Pain in left knee: Secondary | ICD-10-CM

## 2015-02-07 DIAGNOSIS — M24662 Ankylosis, left knee: Secondary | ICD-10-CM

## 2015-02-07 DIAGNOSIS — R293 Abnormal posture: Secondary | ICD-10-CM

## 2015-02-07 NOTE — Therapy (Signed)
[place], [place], [address] Phone: [phone]   Fax:  [phone]  Physical Therapy Treatment / Re-evaluation  Patient Details  Name: [name] MRN: [id number] Date of Birth: 05-14-1941 Referring Provider:  Nolene Ebbs, MD  Encounter Date: 02/07/2015      PT End of Session - 02/07/15 1232    Visit Number 13   Number of Visits 16   Date for PT Re-Evaluation 02/28/15   PT Start Time 4270   PT Stop Time 1230   PT Time Calculation (min) 45 min   Activity Tolerance Patient tolerated treatment well   Behavior During Therapy [facility] for tasks assessed/performed      Past Medical History  Diagnosis Date  . Chronic back pain   . Hypertension   . Scoliosis   . Hyperlipemia   . Arthritis     Past Surgical History  Procedure Laterality Date  . Oophorectomy  1970  . Tubal ligation  1983  . Arthroscopic repair acl  02/03/2004  . Spine surgery  03/31/2006    There were no vitals filed for this visit.  Visit Diagnosis:  Decreased range of knee movement, right - Plan: PT plan of care cert/re-cert  Decreased range of knee movement, left - Plan: PT plan of care cert/re-cert  Weakness of both legs - Plan: PT plan of care cert/re-cert  Posture abnormality - Plan: PT plan of care cert/re-cert  Abnormality of gait - Plan: PT plan of care cert/re-cert  Bilateral knee pain - Plan: PT plan of care cert/re-cert      Subjective Assessment - 02/07/15 1157    Subjective "I am feeling a little sore today, I think the weather is affecting my knee"   Currently in Pain? Yes   Pain Score 5    Pain Location Knee   Pain Orientation Left;Right   Pain Descriptors / Indicators Aching   Pain Type Chronic pain   Pain Onset More than a month ago   Pain Frequency Intermittent            [facility] PT Assessment - 02/07/15 1205    Assessment   Medical Diagnosis bil knee pain   Next MD Visit 5/12/s016    Prior Therapy yes    Precautions   Precautions None   Restrictions   Weight Bearing Restrictions No   Balance Screen   Has the patient fallen in the past 6 months No   Has the patient had a decrease in activity level because of a fear of falling?  No   Is the patient reluctant to leave their home because of a fear of falling?  No   Home Environment   Living Environment Private residence   Living Arrangements Children   Available Help at Discharge Available 24 hours/day   Type of [place] to enter   Entrance Stairs-Number of Steps 6   Prior Function   Level of Independence Needs assistance with ADLs;Needs assistance with homemaking;Needs assistance with gait;Requires assistive device for independence   Vocation Retired   Observation/Other Assessments   Focus on Therapeutic Outcomes (FOTO)  74% limited   AROM   Right Knee Extension -1   Right Knee Flexion 126   Left Knee Extension 0   Left Knee Flexion 129   Strength   Right Knee Flexion 4+/5   Right Knee Extension 4+/5   Left Knee Flexion 4+/5   Left Knee Extension 4+/5  [facility] Adult PT Treatment/Exercise - 02/07/15 0001    Knee/Hip Exercises: Standing   Other Standing Knee Exercises hip ext/abd x 10 ea.   with HHA on counter for safety   Other Standing Knee Exercises sit to stand x 10   Knee/Hip Exercises: Seated   Other Seated Knee/Hip Exercises Clam shells x 10  with blue theraband                PT Education - 02/07/15 1232    Education provided Yes   Education Details HEP review, updated POC to 1 x a week for 3 weeks for transition to Independent exercise   Person(s) Educated Patient   Methods Explanation   Comprehension Verbalized understanding          PT Short Term Goals - 02/07/15 1209    PT SHORT TERM GOAL #1   Title pt will be I with basic HEP 01/08/2015   Baseline modified independent   Time 4   Period Weeks   Status Achieved   PT  SHORT TERM GOAL #2   Title pt will increase bil knee AROM to -5 / 125 to assist with functional and efficient gait 01/08/2015   Baseline RT 118 AROM,  -5 extension AAROM 127, Lt AA 118 -5    Time 4   Period Weeks   Status On-going   PT SHORT TERM GOAL #3   Title she will demonsrate increased strength to >3/5 to help with exercise progression and safety during ambulation 01/08/2015   Baseline 4/5   Time 4   Period Weeks   Status Achieved   PT SHORT TERM GOAL #4   Title she will be able to stand/ walk with <7/10 for >10 minutes to help promote endurance to assist with ADLs 01/08/2015   Baseline went shopping 2 stores 30 minutes one store and 15 minutes the next   Time 4   Period Weeks   Status Achieved   PT SHORT TERM GOAL #5   Title pt will be able to verbalize and demonstrate techniques to control pain and inflammation via RICE method 01/08/2015   Baseline 1 tenique verbalized   Time 4   Period Weeks   Status Achieved           PT Long Term Goals - 02/07/15 1210    PT LONG TERM GOAL #1   Title pt will be I with advanced HEP 02/03/2015   Time 8   Period Weeks   Status On-going   PT LONG TERM GOAL #2   Title pt will increase bil knee strength to > 4-/5 to help with weight bearing endurance and safety with ambulation 02/03/2015   Time 8   Period Weeks   Status Achieved   PT LONG TERM GOAL #3   Title She will demonstate bil knee AROM of greater than -2 to 128 to assist with gait efficency to promote energy conservation 02/03/2015   Time 8   Period Weeks   Status Partially Met   PT LONG TERM GOAL #4   Title She will be able to tolerated standing and walking for >20 minutes and/or >10 steps with <4/10 pain to help endurances and gait safety 02/03/2015   Baseline 30 minutes on her feet, no pain   Time 8   Period Weeks   Status Achieved   PT LONG TERM GOAL #5   Title She will be able to verbalize and demonstrate techniques to reduce risk or bil knee reinjury via postural  awareness, ambulation with LRAD, and HEP 02/03/2015   Time 8   Period Weeks   Status On-going               Plan - 02/15/2015 1233    Clinical Impression Statement Angelie continues to make progress with increased knee AROM bil and increased strength with mild pain during testing. she met all STG except for #2,  She continues to demonstrate mild pain today rated at a 5/10 which she reported may be attributed to the weather. She continues to utliize her [facility] for ambulation with moderate postural sway. Educated pt  on updating POC to 1 x a week for the next 3 weeks to assist with transition to independent exercise.    Pt will benefit from skilled therapeutic intervention in order to improve on the following deficits Abnormal gait;Decreased knowledge of use of DME;Impaired flexibility;Pain;Improper body mechanics;Increased fascial restricitons;Decreased mobility;Decreased activity tolerance;Decreased endurance;Decreased range of motion;Decreased strength;Postural dysfunction;Difficulty walking;Decreased safety awareness;Decreased balance   Rehab Potential Good   PT Frequency 1x / week   PT Duration 3 weeks   PT Treatment/Interventions ADLs/Self Care Home Management;Electrical Stimulation;Gait training;Therapeutic exercise;Patient/family education;Moist Heat;Stair training;Balance training;Passive range of motion;Manual techniques;Neuromuscular re-education;Functional mobility training;Cryotherapy;Ultrasound;Therapeutic activities;Dry needling   PT Next Visit Plan  continue functional strengthening as well as PRE, balance training.    PT Home Exercise Plan HEP review   Consulted and Agree with Plan of Care Patient          G-Codes - 02-15-2015 1241    Functional Assessment Tool Used FOTO 74% limited   Functional Limitation Mobility: Walking and moving around   Mobility: Walking and Moving Around Current Status [phone]) At least 60 percent but less than 80 percent impaired, limited or restricted    Mobility: Walking and Moving Around Goal Status ([id number]) At least 40 percent but less than 60 percent impaired, limited or restricted      Problem List There are no active problems to display for this patient.  [name] PT, DPT, LAT, ATC  February 15, 2015  12:48 PM     [facility] [address] [place], [place], [address] Phone: [phone]   Fax:  [phone]

## 2015-02-09 ENCOUNTER — Ambulatory Visit: Payer: Medicare Other | Admitting: Physical Therapy

## 2015-02-23 ENCOUNTER — Ambulatory Visit: Payer: Medicare Other | Attending: Internal Medicine | Admitting: Physical Therapy

## 2015-02-23 DIAGNOSIS — R269 Unspecified abnormalities of gait and mobility: Secondary | ICD-10-CM | POA: Diagnosis present

## 2015-02-23 DIAGNOSIS — M25562 Pain in left knee: Secondary | ICD-10-CM | POA: Insufficient documentation

## 2015-02-23 DIAGNOSIS — R293 Abnormal posture: Secondary | ICD-10-CM | POA: Diagnosis present

## 2015-02-23 DIAGNOSIS — M25561 Pain in right knee: Secondary | ICD-10-CM | POA: Diagnosis present

## 2015-02-23 DIAGNOSIS — M24661 Ankylosis, right knee: Secondary | ICD-10-CM | POA: Insufficient documentation

## 2015-02-23 DIAGNOSIS — M24662 Ankylosis, left knee: Secondary | ICD-10-CM | POA: Diagnosis present

## 2015-02-23 DIAGNOSIS — R29898 Other symptoms and signs involving the musculoskeletal system: Secondary | ICD-10-CM

## 2015-02-23 NOTE — Therapy (Addendum)
[place], [place], [address] Phone: [phone]   Fax:  [phone]  Physical Therapy Treatment  Patient Details  Name: [name] MRN: [id number] Date of Birth: 10/17/1940 Referring Provider:  Nolene Ebbs, MD  Encounter Date: 02/23/2015      PT End of Session - 02/23/15 1627    Visit Number 14   Number of Visits 16   Date for PT Re-Evaluation 02/28/15   PT Start Time 2778   PT Stop Time 1627   PT Time Calculation (min) 42 min   Activity Tolerance Patient tolerated treatment well   Behavior During Therapy [facility] for tasks assessed/performed      Past Medical History  Diagnosis Date  . Chronic back pain   . Hypertension   . Scoliosis   . Hyperlipemia   . Arthritis     Past Surgical History  Procedure Laterality Date  . Oophorectomy  1970  . Tubal ligation  1983  . Arthroscopic repair acl  02/03/2004  . Spine surgery  03/31/2006    There were no vitals filed for this visit.  Visit Diagnosis:  Decreased range of knee movement, right  Decreased range of knee movement, left  Weakness of both legs  Posture abnormality  Abnormality of gait  Bilateral knee pain      Subjective Assessment - 02/23/15 1553    Subjective " I am doing pretty good today, I got a cortizone injection in my knee's yesterday, and my L shoulder"   Currently in Pain? Yes   Pain Score 1    Pain Location Knee   Pain Orientation Right;Left   Pain Descriptors / Indicators Aching   Pain Type Chronic pain   Pain Onset More than a month ago   Pain Frequency Intermittent                         [facility] Adult PT Treatment/Exercise - 02/23/15 0001    Knee/Hip Exercises: Aerobic   Stationary Bike Nu step L 4 x 10 minutes.   Knee/Hip Exercises: Standing   Heel Raises Both;2 sets;15 reps   Heel Raises Limitations with toe raises 2 x 15   Lateral Step Up 1 set;5 sets;Step Height: 6";Both   Forward Step Up 1 set;Both;5 reps;Step Height: 4"   Other Standing Knee Exercises hip ext/abd x 15 ea.    Other Standing Knee Exercises sit to stand x 10  from elevated table   Knee/Hip Exercises: Seated   Long Arc Quad Right;Left;10 reps;Weights;2 sets   Long Arc Quad Weight 5 lbs.   Other Seated Knee/Hip Exercises seated marching 3 x 15  5#   Knee/Hip Exercises: Supine   Straight Leg Raises Strengthening;Right;Left;10 reps;2 sets                PT Education - 02/23/15 1626    Education provided Yes   Education Details next visit being last visit to bring in all HEP for review   Person(s) Educated Patient   Methods Explanation   Comprehension Verbalized understanding          PT Short Term Goals - 02/23/15 1631    PT SHORT TERM GOAL #1   Title pt will be I with basic HEP 01/08/2015   Baseline modified independent   Time 4   Period Weeks   Status Achieved   PT SHORT TERM GOAL #2   Title pt will increase bil knee AROM to -  5 / 125 to assist with functional and efficient gait 01/08/2015   Baseline RT 118 AROM,  -5 extension AAROM 127, Lt AA 118 -5    Time 4   Period Weeks   Status On-going   PT SHORT TERM GOAL #3   Title she will demonsrate increased strength to >3/5 to help with exercise progression and safety during ambulation 01/08/2015   Baseline 4/5   Time 4   Period Weeks   Status Achieved   PT SHORT TERM GOAL #4   Title she will be able to stand/ walk with <7/10 for >10 minutes to help promote endurance to assist with ADLs 01/08/2015   Baseline went shopping 2 stores 30 minutes one store and 15 minutes the next   Time 4   Period Weeks   Status Achieved   PT SHORT TERM GOAL #5   Title pt will be able to verbalize and demonstrate techniques to control pain and inflammation via RICE method 01/08/2015   Baseline 1 tenique verbalized   Time 4   Period Weeks   Status Achieved           PT Long Term Goals - 02/23/15 1632    PT LONG TERM GOAL #1   Title  pt will be I with advanced HEP 02/03/2015   Time 8   Period Weeks   Status Achieved   PT LONG TERM GOAL #2   Title pt will increase bil knee strength to > 4-/5 to help with weight bearing endurance and safety with ambulation 02/03/2015   Time 8   Period Weeks   Status Achieved   PT LONG TERM GOAL #3   Title She will demonstate bil knee AROM of greater than -2 to 128 to assist with gait efficency to promote energy conservation 02/03/2015   Time 8   Period Weeks   Status Partially Met   PT LONG TERM GOAL #4   Title She will be able to tolerated standing and walking for >20 minutes and/or >10 steps with <4/10 pain to help endurances and gait safety 02/03/2015   Baseline 30 minutes on her feet, no pain   Time 8   Period Weeks   Status Achieved   PT LONG TERM GOAL #5   Title She will be able to verbalize and demonstrate techniques to reduce risk or bil knee reinjury via postural awareness, ambulation with LRAD, and HEP 02/03/2015   Time 8   Period Weeks   Status Achieved                Plan - 02/23/15 1627    Clinical Impression Statement Trea presents to therapy today with report of getting cortizone injections in her knees and in her shoulder just yesterday. focused todays exercises on hips due to the recent injection. She tolerated exercises well with no complaint of increased pain or symptoms. educated that the next she will be discharged and for pt to bring in all her HEP.   PT Next Visit Plan HEP review, assess goals, FOTO discharge   Consulted and Agree with Plan of Care Patient        Problem List There are no active problems to display for this patient.  [name] PT, DPT, LAT, ATC  02/23/2015  4:34 PM    [facility] [address] [place], [place], [address] Phone: [phone]   Fax:  [phone]  PHYSICAL THERAPY DISCHARGE SUMMARY  Visits from Start of Care: 14  Current  functional level related to goals / functional outcomes: FOTO 74% limited   Remaining deficits: Intermittent knee pain, mild limited strength bil   Education / Equipment: HEP, therabands   Plan: Patient agrees to discharge.  Patient goals were partially met. Patient is being discharged due to being pleased with the current functional level.  ?????         [name] PT, DPT, LAT, ATC  03/02/2015  6:06 PM

## 2015-03-02 ENCOUNTER — Ambulatory Visit: Payer: Medicare Other | Admitting: Physical Therapy

## 2015-03-07 ENCOUNTER — Ambulatory Visit (HOSPITAL_BASED_OUTPATIENT_CLINIC_OR_DEPARTMENT_OTHER): Payer: Medicare Other | Attending: Internal Medicine

## 2015-03-07 VITALS — Ht 64.0 in | Wt 220.0 lb

## 2015-03-07 DIAGNOSIS — R0683 Snoring: Secondary | ICD-10-CM | POA: Diagnosis not present

## 2015-03-07 DIAGNOSIS — G4733 Obstructive sleep apnea (adult) (pediatric): Secondary | ICD-10-CM | POA: Diagnosis not present

## 2015-03-07 DIAGNOSIS — G4730 Sleep apnea, unspecified: Secondary | ICD-10-CM | POA: Diagnosis present

## 2015-03-09 ENCOUNTER — Ambulatory Visit: Payer: Medicare Other | Admitting: Physical Therapy

## 2015-03-12 DIAGNOSIS — G4733 Obstructive sleep apnea (adult) (pediatric): Secondary | ICD-10-CM

## 2015-03-12 NOTE — Progress Notes (Signed)
    Patient Name: [name], [name] Date: 03/07/2015 Gender: Female D.O.B: November 02, 1940 Age (years): 67 Referring Provider: [name] Height (inches): 64 Interpreting Physician: Jetty Duhamel MD, ABSM Weight (lbs): 220 RPSGT: Melburn Popper BMI: 38 MRN: [id number] Neck Size: 13.00 CLINICAL INFORMATION The patient is referred for a CPAP titration to treat sleep apnea.     Date of NPSG, Split Night or HST: Baseline NPSG 07/18/13 AHI 6.5/ hr weight 210 lbs; CPAP titration 08/26/13 to 11 cwp.  SLEEP STUDY TECHNIQUE As per the AASM Manual for the Scoring of Sleep and Associated Events v2.3 (April 2016) with a hypopnea requiring 4% desaturations.  The channels recorded and monitored were frontal, central and occipital EEG, electrooculogram (EOG), submentalis EMG (chin), nasal and oral airflow, thoracic and abdominal wall motion, anterior tibialis EMG, snore microphone, electrocardiogram, and pulse oximetry. Continuous positive airway pressure (CPAP) was initiated at the beginning of the study and titrated to treat sleep-disordered breathing.  MEDICATIONS Medications taken by the patient : charted for review Medications administered by patient during sleep study : No sleep medicine administered. TECHNICIAN COMMENTS Comments added by technician: NONE  Comments added by scorer: N/A  RESPIRATORY PARAMETERS Optimal PAP Pressure (cm): 10 AHI at Optimal Pressure (/hr): 0.0 Overall Minimal O2 (%): 89.00 Supine % at Optimal Pressure (%): 100 Minimal O2 at Optimal Pressure (%): 93.0    SLEEP ARCHITECTURE The study was initiated at 10:46:13 PM and ended at 5:09:27 AM.  Sleep onset time was 21.2 minutes and the sleep efficiency was 75.8%. The total sleep time was 290.5 minutes.  The patient spent 1.20% of the night in stage N1 sleep, 82.62% in stage N2 sleep, 6.71% in stage N3 and 9.47% in REM.Stage REM latency was 248.5 minutes  Wake after sleep onset was 71.5. Alpha intrusion was  absent. Supine sleep was 75.90%.  CARDIAC DATA The 2 lead EKG demonstrated sinus rhythm. The mean heart rate was 50 beats per minute. Other EKG findings include: None.  LEG MOVEMENT DATA The total Periodic Limb Movements of Sleep (PLMS) were 23. The PLMS index was 4.75. A PLMS index of <15 is considered normal in adults.  IMPRESSIONS The optimal PAP pressure was 10 cm of water. The patient was pleased with this pressure and the mask used. She did not wake with the bothersome dry mouth which had been a complaint previously. Central sleep apnea was not noted during this titration (CAI = 0.8/h). Mild oxygen desaturations were observed during this titration (min O2 = 89.00%). The patient snored with Soft snoring volume during this titration study. No cardiac abnormalities were observed during this study. Clinically significant periodic limb movements were not noted during this study. Arousals associated with PLMs were rare.  DIAGNOSIS Obstructive Sleep Apnea (327.23 [G47.33 ICD-10])  RECOMMENDATIONS Trial of CPAP therapy on 10 cm H2O with a Small size Fisher&Paykel Full Face Mask Simplus mask and heated humidification. Avoid alcohol, sedatives and other CNS depressants that may worsen sleep apnea and disrupt normal sleep architecture. Sleep hygiene should be reviewed to assess factors that may improve sleep quality. Weight management and regular exercise should be initiated or continued.  Waymon Budge Diplomate, American Board of Sleep Medicine  ELECTRONICALLY SIGNED ON:  03/12/2015, 4:54 PM [facility] PH: [phone]   FX: [phone] ACCREDITED BY THE AMERICAN ACADEMY OF SLEEP MEDICINE

## 2015-04-20 ENCOUNTER — Ambulatory Visit: Payer: Medicare Other | Admitting: Podiatry

## 2015-05-10 ENCOUNTER — Ambulatory Visit (INDEPENDENT_AMBULATORY_CARE_PROVIDER_SITE_OTHER): Payer: Medicare Other | Admitting: Podiatry

## 2015-05-10 ENCOUNTER — Encounter: Payer: Self-pay | Admitting: Podiatry

## 2015-05-10 DIAGNOSIS — B351 Tinea unguium: Secondary | ICD-10-CM

## 2015-05-10 DIAGNOSIS — M79676 Pain in unspecified toe(s): Secondary | ICD-10-CM

## 2015-05-10 NOTE — Progress Notes (Signed)
Patient ID: [name], female   DOB: [birth year], 74 y.o.   MRN: [id number]  Subjective: Patient presents for scheduled visit complaining of painful toenails and requests nail debridement  Objective: The toenails are elongated, hypertrophic, incurvated, discolored and tender to direct palpation 6-10 Keratoses fourth right toe  Assessment: Symptomatic onychomycoses 6-10 Corn fourth right toe associated with hammertoe  Plan: Debridement toenails 10 and mechanically and electrically without a bleeding Debrided corn 1 without a bleeding  Reappoint 3 months

## 2015-05-26 ENCOUNTER — Other Ambulatory Visit: Payer: Self-pay | Admitting: Internal Medicine

## 2015-05-26 DIAGNOSIS — E2839 Other primary ovarian failure: Secondary | ICD-10-CM

## 2015-06-29 ENCOUNTER — Inpatient Hospital Stay: Admission: RE | Admit: 2015-06-29 | Payer: Medicare Other | Source: Ambulatory Visit

## 2015-07-27 ENCOUNTER — Ambulatory Visit: Payer: Medicare Other | Admitting: Diagnostic Neuroimaging

## 2015-08-03 ENCOUNTER — Inpatient Hospital Stay: Admission: RE | Admit: 2015-08-03 | Payer: Medicare Other | Source: Ambulatory Visit

## 2015-08-08 ENCOUNTER — Ambulatory Visit (INDEPENDENT_AMBULATORY_CARE_PROVIDER_SITE_OTHER): Payer: Medicare Other | Admitting: Diagnostic Neuroimaging

## 2015-08-08 ENCOUNTER — Encounter (INDEPENDENT_AMBULATORY_CARE_PROVIDER_SITE_OTHER): Payer: Self-pay

## 2015-08-08 ENCOUNTER — Encounter: Payer: Self-pay | Admitting: Diagnostic Neuroimaging

## 2015-08-08 VITALS — BP 147/72 | HR 51 | Ht 61.0 in | Wt 220.0 lb

## 2015-08-08 DIAGNOSIS — F329 Major depressive disorder, single episode, unspecified: Secondary | ICD-10-CM

## 2015-08-08 DIAGNOSIS — R413 Other amnesia: Secondary | ICD-10-CM

## 2015-08-08 DIAGNOSIS — F32A Depression, unspecified: Secondary | ICD-10-CM

## 2015-08-08 NOTE — Progress Notes (Signed)
[facility]  PATIENT: [name] DOB: 1940/11/19  REFERRING CLINICIAN: Avbuere HISTORY FROM: patient REASON FOR VISIT: follow up   HISTORICAL  CHIEF COMPLAINT:  Chief Complaint  Patient presents with  . Memory Loss    rm 6, grand daughterDayle Points, MMSE 54  . Follow-up    1 year    HISTORY OF PRESENT ILLNESS:   UPDATE 08/08/15: Since last visit, depression continues; seeing psychiatry ([facility]; [facility]). Memory loss stable. Now living with grad-daughter. Pt no longer driving.   UPDATE 07/26/14: Since last visit, she is stable. Here alone for this visit. Still struggling with stress (husband still in hospice), depression, chronic pain. On aricept  daily. Still driving. Got lost on the way here. Usually able to get places that she is familiar with.   PRIOR HPI (01/24/14): 74 year old left-handed female here for evaluation of memory loss. Since 2013 patient has had progressive short-term memory problems, forgetting recent events, having more trouble paying bills, having trouble cooking and preparing meals. She is primary caregiver for her husband who is in hospice for cancer. Patient and her son have noticed these memory problems. Patient was started on Aricept in 2013, and she's been tolerating this well. It was recently increased from 5 up to 10 mg per day. Patient also struggling with chronic pain, anxiety, depression, sleep apnea. She is seeing psychiatry now and pain management.  REVIEW OF SYSTEMS: Full 14 system review of systems performed and notable only for memory loss dizziness depression anxiety pains ey issues fatigue runny nose apnea.    ALLERGIES: Allergies  Allergen Reactions  . Codeine Itching    HOME MEDICATIONS: Outpatient Prescriptions Prior to Visit  Medication Sig Dispense Refill  . alendronate (FOSAMAX) 70 MG tablet Take 70 mg by mouth every 7 (seven) days. Take on Sundays. Take with a full glass of water on an empty  stomach.    Marland Kitchen amLODipine (NORVASC) 5 MG tablet Take 5 mg by mouth daily.    Marland Kitchen aspirin EC 81 MG tablet Take 325 mg by mouth daily.     Marland Kitchen atorvastatin (LIPITOR) 20 MG tablet Take 20 mg by mouth daily.    Marland Kitchen desvenlafaxine (PRISTIQ) 100 MG 24 hr tablet Take 100 mg by mouth daily.    Marland Kitchen donepezil (ARICEPT) 10 MG tablet Take 10 mg by mouth at bedtime.    . fexofenadine (ALLEGRA) 180 MG tablet Take 180 mg by mouth daily as needed. For allergies.    . furosemide (LASIX) 40 MG tablet Take 40 mg by mouth daily as needed. For swelling.    Marland Kitchen lisinopril (PRINIVIL,ZESTRIL) 10 MG tablet Take 10 mg by mouth daily.    Marland Kitchen oxyCODONE (ROXICODONE) 15 MG immediate release tablet Take 15 mg by mouth every 4 (four) hours as needed for pain.    . potassium chloride (KLOR-CON 10) 10 MEQ tablet Take 10 mEq by mouth 2 (two) times daily.    . prednisoLONE acetate (PRED FORTE) 1 % ophthalmic suspension Place 1 drop into the left eye 3 (three) times daily.    Marland Kitchen PROAIR HFA 108 (90 BASE) MCG/ACT inhaler as needed.  0  . ALPRAZolam (XANAX) 0.5 MG tablet Take 0.5 mg by mouth at bedtime as needed for anxiety.    . fentaNYL (DURAGESIC - DOSED MCG/HR) 50 MCG/HR Place 75 mcg onto the skin every 3 (three) days.      No facility-administered medications prior to visit.    PAST MEDICAL HISTORY: Past Medical History  Diagnosis  Date  . Chronic back pain   . Hypertension   . Scoliosis   . Hyperlipemia   . Arthritis     PAST SURGICAL HISTORY: Past Surgical History  Procedure Laterality Date  . Oophorectomy  1970  . Tubal ligation  1983  . Arthroscopic repair acl  02/03/2004  . Spine surgery  03/31/2006    FAMILY HISTORY: Family History  Problem Relation Age of Onset  . Other Father     kidney problems    SOCIAL HISTORY:  Social History   Social History  . Marital Status: Married    Spouse Name: Chrissie Noa  . Number of Children: 2  . Years of Education: 10   Occupational History  .     Social History Main  Topics  . Smoking status: Former Smoker -- 1.00 packs/day for 10 years    Types: Cigarettes    Quit date: 08/12/1973  . Smokeless tobacco: Never Used  . Alcohol Use: No  . Drug Use: No  . Sexual Activity: Not on file   Other Topics Concern  . Not on file   Social History Narrative   Patient lives at home with spouse.   Caffeine Use: occasionally     PHYSICAL EXAM  Filed Vitals:   08/08/15 1001  BP: 147/72  Pulse: 51  Height:  (1.549 m)  Weight: 220 lb (99.791 kg)    Not recorded     Body mass index is 41.59 kg/(m^2).  MMSE - Mini Mental State Exam 08/08/2015 07/26/2014  Orientation to time 4 5  Orientation to Place 5 5  Registration 3 3  Attention/ Calculation 5 1  Recall 3 2  Language- name 2 objects 2 2  Language- repeat 1 1  Language- follow 3 step command 3 3  Language- read & follow direction 1 1  Write a sentence 1 1  Copy design 1 1  Total score 29 25    GENERAL EXAM: Patient is in no distress; well developed, nourished and groomed; neck is supple; TEARFUL  CARDIOVASCULAR: Regular rate and rhythm, no murmurs, no carotid bruits  NEUROLOGIC: MENTAL STATUS: awake, alert, oriented to person, place and time, recent and remote memory intact, normal attention and concentration, language fluent, comprehension intact, naming intact, fund of knowledge appropriate; NO FRONTAL RELEASE SIGNS. CRANIAL NERVE:  pupils equal and reactive to light, visual fields full to confrontation, extraocular muscles intact, no nystagmus, facial sensation and strength symmetric, hearing intact, palate elevates symmetrically, uvula midline, shoulder shrug symmetric, tongue midline. MOTOR: normal bulk and tone, full strength in the BUE, BLE SENSORY: normal and symmetric to light touch COORDINATION: finger-nose-finger, fine finger movements normal REFLEXES: deep tendon reflexes present and symmetric GAIT/STATION: narrow based gait; ANTALGIC, SLOW TO RISE. USES SINGLE POINT CANE.  UNSTEADY.    DIAGNOSTIC DATA (LABS, IMAGING, TESTING) - I reviewed patient records, labs, notes, testing and imaging myself where available.  No results found for: WBC No results found for: NA No results found for: CHOL No results found for: HGBA1C Lab Results  Component Value Date   VITAMINB12 627 01/24/2014   Lab Results  Component Value Date   TSH 0.224* 01/24/2014    02/02/14 MRI brain (without) demonstrating: 1. 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 2. Mild perisylvian atrophy. 3. No acute findings.     ASSESSMENT AND PLAN  74 y.o. year old female here with memory loss since 2013. Significant contributing factors (depression, anxiety, pain syndrome, pain medications,  sleep apnea). Unlikely to represent primary neurodegenerative condition at this time. MMSE stable (24-->25-->29).  PLAN: - continue pain mgmt, psychiatry and sleep disorder treatement - focus on healthy eating, physical activity and social/mental stimulating activities - follow up with PCP and psychiatry  Return if symptoms worsen or fail to improve, for return to PCP.    Suanne Marker, MD 08/08/2015, 10:50 AM Certified in Neurology, Neurophysiology and Neuroimaging  [facility] [address] [place], [place] [address] [phone]

## 2015-08-08 NOTE — Patient Instructions (Signed)
Thank you for coming to see us at [facility]. I hope we have been able to provide you high quality care today.  You may receive a patient satisfaction survey over the next few weeks. We would appreciate your feedback and comments so that we may continue to improve ourselves and the health of our patients.  - continue current medications   ~~~~~~~~~~~~~~~~~~~~~~~~~~~~~~~~~~~~~~~~~~~~~~~~~~~~~~~~~~~~~~~~~  DR. Andron Marrazzo'S GUIDE TO HAPPY AND HEALTHY LIVING These are some of my general health and wellness recommendations. Some of them may apply to you better than others. Please use common sense as you try these suggestions and feel free to ask me any questions.   ACTIVITY/FITNESS Mental, social, emotional and physical stimulation are very important for brain and body health. Try learning a new activity (arts, music, language, sports, games).  Keep moving your body to the best of your abilities. You can do this at home, inside or outside, the park, community center, gym or anywhere you like. Consider a physical therapist or personal trainer to get started. Consider the app Sworkit. Fitness trackers such as smart-watches, smart-phones or Fitbits can help as well.   NUTRITION Eat more plants: colorful vegetables, nuts, seeds and berries.  Eat less sugar, salt, preservatives and processed foods.  Avoid toxins such as cigarettes and alcohol.  Drink water when you are thirsty. Warm water with a slice of lemon is an excellent morning drink to start the day.  Consider these websites for more information The Nutrition Source (https://www.hsph.[employer].edu/nutritionsource) Precision Nutrition (www.precisionnutrition.com/blog/infographics)   RELAXATION Consider practicing mindfulness meditation or other relaxation techniques such as deep breathing, prayer, yoga, tai chi, massage. See website mindful.org or the apps Headspace or Calm to help get started.   SLEEP Try to get at  least 7-8+ hours sleep per day. Regular exercise and reduced caffeine will help you sleep better. Practice good sleep hygeine techniques. See website sleep.org for more information.   PLANNING Prepare estate planning, living will, healthcare POA documents. Sometimes this is best planned with the help of an attorney. Theconversationproject.org and agingwithdignity.org are excellent resources.  

## 2015-08-16 ENCOUNTER — Ambulatory Visit (INDEPENDENT_AMBULATORY_CARE_PROVIDER_SITE_OTHER): Payer: Medicare Other | Admitting: Podiatry

## 2015-08-16 ENCOUNTER — Encounter: Payer: Self-pay | Admitting: Podiatry

## 2015-08-16 DIAGNOSIS — M79676 Pain in unspecified toe(s): Secondary | ICD-10-CM | POA: Diagnosis not present

## 2015-08-16 DIAGNOSIS — B351 Tinea unguium: Secondary | ICD-10-CM

## 2015-08-16 NOTE — Progress Notes (Signed)
Patient ID: [name], female   DOB: 1940/09/25, 75 y.o.   MRN: [id number]  Subjective: This patient presents again for schedule visit complaining of thickened and elongated toenails which are uncomfortable walking wearing shoes and requests toenail debridement  Objective: Orientated 3 Open skin lesions bilaterally The toenails are hypertrophic, discolored, deformed, incurvated and tender to direct palpation 6-10  Assessment: Symptomatic onychomycoses 6-10  Plan: Debridement toenails 6-10 mechanically and left without any  bleeding  Reappoint 3

## 2015-09-07 ENCOUNTER — Ambulatory Visit
Admission: RE | Admit: 2015-09-07 | Discharge: 2015-09-07 | Disposition: A | Discharge status: Home / Self Care | Payer: Medicare Other | Source: Ambulatory Visit | Attending: Internal Medicine | Admitting: Internal Medicine

## 2015-09-07 DIAGNOSIS — E2839 Other primary ovarian failure: Secondary | ICD-10-CM

## 2015-11-14 ENCOUNTER — Encounter: Payer: Self-pay | Admitting: Podiatry

## 2015-11-14 ENCOUNTER — Ambulatory Visit (INDEPENDENT_AMBULATORY_CARE_PROVIDER_SITE_OTHER): Payer: Medicare Other | Admitting: Podiatry

## 2015-11-14 DIAGNOSIS — M79676 Pain in unspecified toe(s): Secondary | ICD-10-CM

## 2015-11-14 DIAGNOSIS — B351 Tinea unguium: Secondary | ICD-10-CM | POA: Diagnosis not present

## 2015-11-14 NOTE — Progress Notes (Signed)
Patient ID: [name], female   DOB: 1941/03/14, 75 y.o.   MRN: [id number]  Subjective: This patient presents again for schedule visit complaining of thickened and elongated toenails which are uncomfortable walking wearing shoes and requests toenail debridement  Objective: Orientated 3  No Open skin lesions bilaterally Small corn fourth right toe The toenails are hypertrophic, discolored, deformed, incurvated and tender to direct palpation 6-10  Assessment: Symptomatic onychomycoses 6-10 Corn fourth right toe  Plan: Debridement toenails 6-10 mechanically and left without any bleeding Debrided corn fourth right toe without any bleeding  Reappoint 3

## 2016-02-14 ENCOUNTER — Encounter (INDEPENDENT_AMBULATORY_CARE_PROVIDER_SITE_OTHER): Payer: Medicare Other | Admitting: Podiatry

## 2016-02-14 NOTE — Progress Notes (Signed)
This encounter was created in error - please disregard.

## 2016-02-27 ENCOUNTER — Encounter: Payer: Self-pay | Admitting: Podiatry

## 2016-02-27 ENCOUNTER — Ambulatory Visit (INDEPENDENT_AMBULATORY_CARE_PROVIDER_SITE_OTHER): Payer: Medicare Other | Admitting: Podiatry

## 2016-02-27 VITALS — BP 174/90 | HR 58 | Resp 16

## 2016-02-27 DIAGNOSIS — B351 Tinea unguium: Secondary | ICD-10-CM

## 2016-02-27 DIAGNOSIS — M79676 Pain in unspecified toe(s): Secondary | ICD-10-CM | POA: Diagnosis not present

## 2016-02-27 NOTE — Progress Notes (Signed)
Patient ID: [name], female   DOB: Nov 15, 1940, 75 y.o.   MRN: [id number] Complaint:  Visit Type: Patient returns to my office for continued preventative foot care services. Complaint: Patient states" my nails have grown long and thick and become painful to walk and wear shoes"  The patient presents for preventative foot care services. No changes to ROS  Podiatric Exam: Vascular: dorsalis pedis and posterior tibial pulses are palpable bilateral. Capillary return is immediate. Temperature gradient is WNL. Skin turgor WNL  Sensorium: Normal Semmes Weinstein monofilament test. Normal tactile sensation bilaterally. Nail Exam: Pt has thick disfigured discolored nails with subungual debris noted bilateral entire nail hallux through fifth toenails Ulcer Exam: There is no evidence of ulcer or pre-ulcerative changes or infection. Orthopedic Exam: Muscle tone and strength are WNL. No limitations in general ROM. No crepitus or effusions noted. Foot type and digits show no abnormalities. Bony prominences are unremarkable. Skin: No Porokeratosis. No infection or ulcers  Diagnosis:  Onychomycosis, , Pain in right toe, pain in left toes  Treatment & Plan Procedures and Treatment: Consent by patient was obtained for treatment procedures. The patient understood the discussion of treatment and procedures well. All questions were answered thoroughly reviewed. Debridement of mycotic and hypertrophic toenails, 1 through 5 bilateral and clearing of subungual debris. No ulceration, no infection noted.  Return Visit-Office Procedure: Patient instructed to return to the office for a follow up visit 3 months for continued evaluation and treatment.    Helane Gunther DPM

## 2016-03-06 ENCOUNTER — Ambulatory Visit: Payer: Medicare Other | Admitting: Podiatry

## 2016-03-08 ENCOUNTER — Encounter (HOSPITAL_COMMUNITY): Payer: Self-pay | Admitting: Emergency Medicine

## 2016-03-08 ENCOUNTER — Emergency Department (HOSPITAL_COMMUNITY): Payer: Medicare Other

## 2016-03-08 ENCOUNTER — Emergency Department (HOSPITAL_COMMUNITY)
Admission: EM | Admit: 2016-03-08 | Discharge: 2016-03-08 | Disposition: A | Discharge status: Home / Self Care | Payer: Medicare Other | Attending: Emergency Medicine | Admitting: Emergency Medicine

## 2016-03-08 DIAGNOSIS — Z7982 Long term (current) use of aspirin: Secondary | ICD-10-CM | POA: Diagnosis not present

## 2016-03-08 DIAGNOSIS — Z79899 Other long term (current) drug therapy: Secondary | ICD-10-CM | POA: Insufficient documentation

## 2016-03-08 DIAGNOSIS — R0789 Other chest pain: Secondary | ICD-10-CM | POA: Insufficient documentation

## 2016-03-08 DIAGNOSIS — R079 Chest pain, unspecified: Secondary | ICD-10-CM | POA: Diagnosis present

## 2016-03-08 DIAGNOSIS — I10 Essential (primary) hypertension: Secondary | ICD-10-CM | POA: Diagnosis not present

## 2016-03-08 DIAGNOSIS — Z87891 Personal history of nicotine dependence: Secondary | ICD-10-CM | POA: Diagnosis not present

## 2016-03-08 LAB — BASIC METABOLIC PANEL
Anion gap: 8 (ref 5–15)
BUN: 10 mg/dL (ref 6–20)
CHLORIDE: 103 mmol/L (ref 101–111)
CO2: 28 mmol/L (ref 22–32)
Calcium: 9.6 mg/dL (ref 8.9–10.3)
Creatinine, Ser: 0.68 mg/dL (ref 0.44–1.00)
GFR calc non Af Amer: >60 mL/min (ref >60)
Glucose, Bld: 141 mg/dL — ABNORMAL HIGH (ref 65–99)
POTASSIUM: 3.4 mmol/L — AB (ref 3.5–5.1)
SODIUM: 139 mmol/L (ref 135–145)

## 2016-03-08 LAB — I-STAT TROPONIN, ED
TROPONIN I, POC: 0 ng/mL (ref 0.00–0.08)
Troponin i, poc: 0 ng/mL (ref 0.00–0.08)

## 2016-03-08 LAB — CBC
HEMATOCRIT: 44.3 % (ref 36.0–46.0)
Hemoglobin: 14.5 g/dL (ref 12.0–15.0)
MCH: 32.7 pg (ref 26.0–34.0)
MCHC: 32.7 g/dL (ref 30.0–36.0)
MCV: 99.8 fL (ref 78.0–100.0)
Platelets: 188 10*3/uL (ref 150–400)
RBC: 4.44 MIL/uL (ref 3.87–5.11)
RDW: 13 % (ref 11.5–15.5)
WBC: 8 10*3/uL (ref 4.0–10.5)

## 2016-03-08 MED ORDER — OXYCODONE HCL 5 MG PO TABS
15.0000 mg | ORAL_TABLET | ORAL | Status: AC | PRN
  Administered 2016-03-08: 15 mg via ORAL
  Filled 2016-03-08: qty 3

## 2016-03-08 MED ORDER — AMLODIPINE BESYLATE 5 MG PO TABS
5.0000 mg | ORAL_TABLET | Freq: Every day | ORAL | Status: DC
Start: 2016-03-08 — End: 2016-03-08
  Administered 2016-03-08: 5 mg via ORAL
  Filled 2016-03-08: qty 1

## 2016-03-08 NOTE — Discharge Instructions (Signed)
Today we evaluated your left sided chest pain.  We checked a CBC and CMP which were in normal limits We also checked serial EKG's and troponins which were also within normal limits. Please take your at home pain medications as needed. I am not prescribing more pain meds as you are on a pain contract. Rest and apply heat or ice, whichever feels best.

## 2016-03-08 NOTE — ED Notes (Signed)
Pt's BP 190/79.  Informed Caryn Bee, RN.

## 2016-03-08 NOTE — ED Triage Notes (Addendum)
Pt from home with c/o left sharp chest pain with a different than normal SOB starting yesterday.  Pain with worse with movement and inspiration.  Reports medication noncompliance.  NAD, A&O.

## 2016-03-08 NOTE — ED Provider Notes (Signed)
[facility]-EMERGENCY DEPT Provider Note   CSN: [id number] Arrival date & time: 03/08/16  1353  First Provider Contact:  None       History   Chief Complaint Chief Complaint  Patient presents with  . Chest Pain    HPI [name] is a 75 y.o. female with Hx of HTN, HLD, chronic back pain and arthritis presents to ED for evaluation of chest pain. The patient reports that her chest pain began yesterday morning when standing up from a chair. The patient reports the pain is intermittent and is rated 6/10 at its worst. She describes it as stabbing and describes radiation under her arm and down her left arm. She complained of increased shortness of breath from baseline when the pain first occurred. Denies any nausea, vomiting or diaphoresis. The patient reports her pain is made worse by moving her arm, walking and taking deep breaths. She reports improvement of her symptoms with taking her home dose of Oxycodone 15mg , last dose this am. She reports chronic pain in her left shoulder due to arthritis as well. She reports sh usually gets steroid injections but hasn't had one in several months and her pain has been worse lately.   HPI  Past Medical History:  Diagnosis Date  . Arthritis   . Chronic back pain   . Hyperlipemia   . Hypertension   . Scoliosis     There are no active problems to display for this patient.   Past Surgical History:  Procedure Laterality Date  . ARTHROSCOPIC REPAIR ACL  02/03/2004  . OOPHORECTOMY  1970  . SPINE SURGERY  03/31/2006  . TUBAL LIGATION  1983    OB History    No data available       Home Medications    Prior to Admission medications   Medication Sig Start Date End Date Taking? Authorizing Provider  alendronate (FOSAMAX) 70 MG tablet Take 70 mg by mouth every 7 (seven) days. Take on Sundays. Take with a full glass of water on an empty stomach.    Historical Provider, MD  amLODipine (NORVASC) 5 MG tablet Take 5 mg by mouth daily.     Historical Provider, MD  aspirin EC 81 MG tablet Take 325 mg by mouth daily.     Historical Provider, MD  atorvastatin (LIPITOR) 20 MG tablet Take 20 mg by mouth daily.    Historical Provider, MD  desvenlafaxine (PRISTIQ) 100 MG 24 hr tablet Take 100 mg by mouth daily.    Historical Provider, MD  donepezil (ARICEPT) 10 MG tablet Take 10 mg by mouth at bedtime.    Historical Provider, MD  fentaNYL (DURAGESIC - DOSED MCG/HR) 75 MCG/HR 75 mcg. 07/17/15   Historical Provider, MD  fexofenadine (ALLEGRA) 180 MG tablet Take 180 mg by mouth daily as needed. For allergies.    Historical Provider, MD  furosemide (LASIX) 40 MG tablet Take 40 mg by mouth daily as needed. For swelling.    Historical Provider, MD  lisinopril (PRINIVIL,ZESTRIL) 10 MG tablet Take 10 mg by mouth daily.    Historical Provider, MD  oxyCODONE (ROXICODONE) 15 MG immediate release tablet Take 15 mg by mouth every 4 (four) hours as needed for pain.    Historical Provider, MD  potassium chloride (KLOR-CON 10) 10 MEQ tablet Take 10 mEq by mouth 2 (two) times daily.    Historical Provider, MD  prednisoLONE acetate (PRED FORTE) 1 % ophthalmic suspension Place 1 drop into the left eye 3 (three) times daily.  Historical Provider, MD  PROAIR HFA 108 (90 BASE) MCG/ACT inhaler as needed. 04/27/14   Historical Provider, MD    Family History Family History  Problem Relation Age of Onset  . Other Father     kidney problems    Social History Social History  Substance Use Topics  . Smoking status: Former Smoker    Packs/day: 1.00    Years: 10.00    Types: Cigarettes    Quit date: 08/12/1973  . Smokeless tobacco: Never Used  . Alcohol use No     Allergies   Codeine   Review of Systems Review of Systems  Constitutional: Negative for chills, diaphoresis, fatigue and fever.  Eyes: Negative for photophobia and visual disturbance.  Respiratory: Positive for shortness of breath. Negative for cough, chest tightness and wheezing.     Cardiovascular: Positive for chest pain. Negative for palpitations and leg swelling.  Gastrointestinal: Negative for abdominal distention, abdominal pain, blood in stool, constipation, diarrhea, nausea and vomiting.  Genitourinary: Negative for difficulty urinating, dysuria and flank pain.  Musculoskeletal: Positive for arthralgias. Negative for neck pain and neck stiffness.       Chronic shoulder pain  Skin: Negative for rash.  Neurological: Negative for dizziness, syncope, speech difficulty, numbness and headaches.     Physical Exam Updated Vital Signs BP (!) 188/101 (BP Location: Left Arm)   Pulse 70   Resp 20   SpO2 95%   Physical Exam  Constitutional: She appears well-developed and well-nourished. No distress.  HENT:  Head: Normocephalic and atraumatic.  Eyes: Pupils are equal, round, and reactive to light.  Cardiovascular: Normal rate and regular rhythm.   Pulmonary/Chest: Effort normal and breath sounds normal. No respiratory distress. She has no wheezes. She has no rales. She exhibits tenderness.  Abdominal: Soft. Bowel sounds are normal. She exhibits no distension. There is no tenderness.  Musculoskeletal: She exhibits tenderness.  Warmth and tenderness of left anterior chest wall and left shoulder.  Neurological: She is alert.  Skin: Skin is warm and dry. No rash noted. She is not diaphoretic.  Chest wall inspected, no sign of rash or vesicle formation.      ED Treatments / Results  Labs (all labs ordered are listed, but only abnormal results are displayed) Labs Reviewed  BASIC METABOLIC PANEL - Abnormal; Notable for the following:       Result Value   Potassium 3.4 (*)    Glucose, Bld 141 (*)    All other components within normal limits  CBC  I-STAT TROPOININ, ED  I-STAT TROPOININ, ED    EKG  EKG Interpretation  Date/Time:  Friday March 08 2016 17:06:52 EDT Ventricular Rate:  63 PR Interval:  194 QRS Duration: 104 QT Interval:  443 QTC  Calculation: 454 R Axis:   -42 Text Interpretation:  Sinus rhythm Borderline prolonged PR interval Left anterior fascicular block Anteroseptal infarct, age indeterminate No significant change since last tracing Confirmed by BEATON  MD, ROBERT [id number]) on 03/08/2016 5:36:15 PM       Radiology Dg Chest 2 View  Result Date: 03/08/2016 CLINICAL DATA:  Left chest pain, shortness breath EXAM: CHEST  2 VIEW COMPARISON:  02/14/2012 FINDINGS: Low lung volumes. No focal consolidation. No pleural effusion or pneumothorax. The heart is normal in size. Postsurgical changes involving the lower thoracic/ lumbar spine. IMPRESSION: No evidence of acute cardiopulmonary disease. Electronically Signed   By: Charline Bills M.D.   On: 03/08/2016 15:35   Procedures Procedures (including critical care time)  Medications Ordered in ED Medications  amLODipine (NORVASC) tablet 5 mg (5 mg Oral Given 03/08/16 1803)  oxyCODONE (Oxy IR/ROXICODONE) immediate release tablet 15 mg (15 mg Oral Given 03/08/16 1703)     Initial Impression / Assessment and Plan / ED Course  I have reviewed the triage vital signs and the nursing notes.  Pertinent labs & imaging results that were available during my care of the patient were reviewed by me and considered in my medical decision making (see chart for details).  Clinical Course   CBC, BMP and 1st troponin wnl. CXR without any acute CP disease.  1st EKG without significant changes from prior exam. Serial troponin and EKG ordered.  Patient offered Toradol for pain control however preferred taking her home dose of Oxycodone as she has a pain contract with her pain clinic. Home dose of Oxycodone ordered and will reassess for improvement.   Repeat EKG without significant changes. Repeat troponin negative.   On revaluation, patients pain has subsided with her usual home medication dose. Patient agreed to discharge.   Final Clinical Impressions(s) / ED Diagnoses   Final  diagnoses:  Left-sided chest wall pain    New Prescriptions New Prescriptions   No medications on file     Alondra Vandeven, DO 03/08/16 1842    Nelva Nay, MD 03/08/16 1845

## 2016-03-15 DIAGNOSIS — B351 Tinea unguium: Secondary | ICD-10-CM

## 2016-04-17 ENCOUNTER — Ambulatory Visit: Payer: 59 | Admitting: Podiatry

## 2016-04-22 ENCOUNTER — Ambulatory Visit (INDEPENDENT_AMBULATORY_CARE_PROVIDER_SITE_OTHER): Payer: Medicare Other

## 2016-04-22 ENCOUNTER — Ambulatory Visit (INDEPENDENT_AMBULATORY_CARE_PROVIDER_SITE_OTHER): Payer: Medicare Other | Admitting: Podiatry

## 2016-04-22 DIAGNOSIS — M79674 Pain in right toe(s): Secondary | ICD-10-CM

## 2016-04-22 DIAGNOSIS — M2010 Hallux valgus (acquired), unspecified foot: Secondary | ICD-10-CM

## 2016-04-22 DIAGNOSIS — L97511 Non-pressure chronic ulcer of other part of right foot limited to breakdown of skin: Secondary | ICD-10-CM

## 2016-04-22 DIAGNOSIS — B351 Tinea unguium: Secondary | ICD-10-CM

## 2016-04-22 DIAGNOSIS — M79676 Pain in unspecified toe(s): Secondary | ICD-10-CM | POA: Diagnosis not present

## 2016-04-22 DIAGNOSIS — M2040 Other hammer toe(s) (acquired), unspecified foot: Secondary | ICD-10-CM

## 2016-04-22 NOTE — Progress Notes (Signed)
SUBJECTIVE Patient presents to office today with her son complaining of elongated, thickened nails. Pain while ambulating in shoes. Patient is unable to trim their own nails. Also the patient's concerned about a painful distal tuft ulceration to the third digit of the right foot. The patient was seen by Dr. Elijah Birk and he referred her here for surgical consult of hammertoe deformities bilaterally. She has no other complaints at this time  Allergies  Allergen Reactions  . Codeine Itching    OBJECTIVE General Patient is awake, alert, and oriented x 3 and in no acute distress. Derm Small distal tuft hyperkeratotic lesion to the third digit of the right foot non-ulcerated. Skin is dry and supple bilateral. Negative open lesions or macerations. Remaining integument unremarkable. Nails are tender, long, thickened and dystrophic with subungual debris, consistent with onychomycosis, 1-5 bilateral. No signs of infection noted. Vasc  DP and PT pedal pulses palpable bilaterally. Temperature gradient within normal limits.  Neuro Epicritic and protective threshold sensation diminished bilaterally.  Musculoskeletal Exam No symptomatic pedal deformities noted bilateral. Muscular strength within normal limits.  ASSESSMENT 1. Distal tuft ulceration third digit right foot 2. Onychomycosis of nail due to dermatophyte bilateral 3. Pain in foot bilateral  PLAN OF CARE Patient evaluated today. Instructed to maintain good pedal hygiene and foot care. Discussed with patient the conservative versus surgical management of hammertoe deformities. Patient opted for conservative treatment. Conservative treatment consists of shoe gear modifications padding and routine foot care. Mechanical debridement of nails 1-5 bilaterally performed using a nail nipper. Filed with dremel without incident.  All patient questions were answered. Return to clinic in 3 mos.    Felecia Shelling, DPM

## 2016-05-06 NOTE — Progress Notes (Signed)
Scheduling pre op--please place SURGICAL ORDERS IN EPIC  thanks 

## 2016-05-07 ENCOUNTER — Other Ambulatory Visit: Payer: Self-pay | Admitting: Orthopaedic Surgery

## 2016-05-08 NOTE — Patient Instructions (Addendum)
[name]  05/08/2016   Your procedure is scheduled on: Friday 05/17/2016 Report to [facility] Main  Entrance take [place]  elevators to 3rd floor to  Short Stay Center at   0530  AM.  Call this number if you have problems the morning of surgery [phone]   Remember: ONLY 1 PERSON MAY GO WITH YOU TO SHORT STAY TO GET  READY MORNING OF YOUR SURGERY.   Do not eat food or drink liquids :After Midnight.     Take these medicines the morning of surgery with A SIP OF WATER: Amlodipine, Devenlafaxine (Pristiq), Buspirone (Buspar), Aripiprazole (Abilify), Pred Forte eye drops, Pro-air inhaler if needed              You may not have any metal on your body including hair pins and              piercings  Do not wear jewelry, make-up, lotions, powders or perfumes, deodorant             Do not wear nail polish.  Do not shave  48 hours prior to surgery.              Men may shave face and neck.   Do not bring valuables to the hospital. [place] IS NOT             RESPONSIBLE   FOR VALUABLES.  Contacts, dentures or bridgework may not be worn into surgery.  Leave suitcase in the car. After surgery it may be brought to your room.                  Please read over the following fact sheets you were given: _____________________________________________________________________             [facility] - Preparing for Surgery Before surgery, you can play an important role.  Because skin is not sterile, your skin needs to be as free of germs as possible.  You can reduce the number of germs on your skin by washing with CHG (chlorahexidine gluconate) soap before surgery.  CHG is an antiseptic cleaner which kills germs and bonds with the skin to continue killing germs even after washing. Please DO NOT use if you have an allergy to CHG or antibacterial soaps.  If your skin becomes reddened/irritated stop using the CHG and inform your nurse when you arrive at Short Stay. Do  not shave (including legs and underarms) for at least 48 hours prior to the first CHG shower.  You may shave your face/neck. Please follow these instructions carefully:  1.  Shower with CHG Soap the night before surgery and the  morning of Surgery.  2.  If you choose to wash your hair, wash your hair first as usual with your  normal  shampoo.  3.  After you shampoo, rinse your hair and body thoroughly to remove the  shampoo.                           4.  Use CHG as you would any other liquid soap.  You can apply chg directly  to the skin and wash                       Gently with a scrungie or clean washcloth.  5.  Apply the CHG Soap  to your body ONLY FROM THE NECK DOWN.   Do not use on face/ open                           Wound or open sores. Avoid contact with eyes, ears mouth and genitals (private parts).                       Wash face,  Genitals (private parts) with your normal soap.             6.  Wash thoroughly, paying special attention to the area where your surgery  will be performed.  7.  Thoroughly rinse your body with warm water from the neck down.  8.  DO NOT shower/wash with your normal soap after using and rinsing off  the CHG Soap.                9.  Pat yourself dry with a clean towel.            10.  Wear clean pajamas.            11.  Place clean sheets on your bed the night of your first shower and do not  sleep with pets. Day of Surgery : Do not apply any lotions/deodorants the morning of surgery.  Please wear clean clothes to the hospital/surgery center.  FAILURE TO FOLLOW THESE INSTRUCTIONS MAY RESULT IN THE CANCELLATION OF YOUR SURGERY PATIENT SIGNATURE_________________________________  NURSE SIGNATURE__________________________________  ________________________________________________________________________   Adam Phenix  An incentive spirometer is a tool that can help keep your lungs clear and active. This tool measures how well you are filling your  lungs with each breath. Taking long deep breaths may help reverse or decrease the chance of developing breathing (pulmonary) problems (especially infection) following:  A long period of time when you are unable to move or be active. BEFORE THE PROCEDURE   If the spirometer includes an indicator to show your best effort, your nurse or respiratory therapist will set it to a desired goal.  If possible, sit up straight or lean slightly forward. Try not to slouch.  Hold the incentive spirometer in an upright position. INSTRUCTIONS FOR USE  1. Sit on the edge of your bed if possible, or sit up as far as you can in bed or on a chair. 2. Hold the incentive spirometer in an upright position. 3. Breathe out normally. 4. Place the mouthpiece in your mouth and seal your lips tightly around it. 5. Breathe in slowly and as deeply as possible, raising the piston or the ball toward the top of the column. 6. Hold your breath for 3-5 seconds or for as long as possible. Allow the piston or ball to fall to the bottom of the column. 7. Remove the mouthpiece from your mouth and breathe out normally. 8. Rest for a few seconds and repeat Steps 1 through 7 at least 10 times every 1-2 hours when you are awake. Take your time and take a few normal breaths between deep breaths. 9. The spirometer may include an indicator to show your best effort. Use the indicator as a goal to work toward during each repetition. 10. After each set of 10 deep breaths, practice coughing to be sure your lungs are clear. If you have an incision (the cut made at the time of surgery), support your incision when coughing by placing a pillow or rolled up towels firmly  against it. Once you are able to get out of bed, walk around indoors and cough well. You may stop using the incentive spirometer when instructed by your caregiver.  RISKS AND COMPLICATIONS  Take your time so you do not get dizzy or light-headed.  If you are in pain, you may need  to take or ask for pain medication before doing incentive spirometry. It is harder to take a deep breath if you are having pain. AFTER USE  Rest and breathe slowly and easily.  It can be helpful to keep track of a log of your progress. Your caregiver can provide you with a simple table to help with this. If you are using the spirometer at home, follow these instructions: [place] IF:   You are having difficultly using the spirometer.  You have trouble using the spirometer as often as instructed.  Your pain medication is not giving enough relief while using the spirometer.  You develop fever of 100.5 F (38.1 C) or higher. SEEK IMMEDIATE MEDICAL CARE IF:   You cough up bloody sputum that had not been present before.  You develop fever of 102 F (38.9 C) or greater.  You develop worsening pain at or near the incision site. MAKE SURE YOU:   Understand these instructions.  Will watch your condition.  Will get help right away if you are not doing well or get worse. Document Released: 12/09/2006 Document Revised: 10/21/2011 Document Reviewed: 02/09/2007 [facility] Patient Information 2014 [place], [place].   ________________________________________________________________________

## 2016-05-10 ENCOUNTER — Encounter (HOSPITAL_COMMUNITY): Payer: Self-pay

## 2016-05-10 ENCOUNTER — Encounter (HOSPITAL_COMMUNITY)
Admission: RE | Admit: 2016-05-10 | Discharge: 2016-05-10 | Disposition: A | Discharge status: Home / Self Care | Payer: Medicare Other | Source: Ambulatory Visit | Attending: Orthopaedic Surgery | Admitting: Orthopaedic Surgery

## 2016-05-10 DIAGNOSIS — M1712 Unilateral primary osteoarthritis, left knee: Secondary | ICD-10-CM | POA: Diagnosis not present

## 2016-05-10 DIAGNOSIS — Z01812 Encounter for preprocedural laboratory examination: Secondary | ICD-10-CM | POA: Insufficient documentation

## 2016-05-10 HISTORY — DX: Depression, unspecified: F32.A

## 2016-05-10 HISTORY — DX: Major depressive disorder, single episode, unspecified: F32.9

## 2016-05-10 HISTORY — DX: Unspecified asthma, uncomplicated: J45.909

## 2016-05-10 HISTORY — DX: Sleep apnea, unspecified: G47.30

## 2016-05-10 LAB — CBC
HCT: 40.9 % (ref 36.0–46.0)
HEMOGLOBIN: 13.3 g/dL (ref 12.0–15.0)
MCH: 32.1 pg (ref 26.0–34.0)
MCHC: 32.5 g/dL (ref 30.0–36.0)
MCV: 98.8 fL (ref 78.0–100.0)
PLATELETS: 198 10*3/uL (ref 150–400)
RBC: 4.14 MIL/uL (ref 3.87–5.11)
RDW: 13.1 % (ref 11.5–15.5)
WBC: 7.2 10*3/uL (ref 4.0–10.5)

## 2016-05-10 LAB — BASIC METABOLIC PANEL
Anion gap: 8 (ref 5–15)
BUN: 12 mg/dL (ref 6–20)
CALCIUM: 9 mg/dL (ref 8.9–10.3)
CHLORIDE: 103 mmol/L (ref 101–111)
CO2: 29 mmol/L (ref 22–32)
CREATININE: 0.71 mg/dL (ref 0.44–1.00)
GFR calc Af Amer: >60 mL/min (ref >60)
GFR calc non Af Amer: >60 mL/min (ref >60)
GLUCOSE: 124 mg/dL — AB (ref 65–99)
Potassium: 4.3 mmol/L (ref 3.5–5.1)
Sodium: 140 mmol/L (ref 135–145)

## 2016-05-10 LAB — SURGICAL PCR SCREEN
MRSA, PCR: NEGATIVE
Staphylococcus aureus: NEGATIVE

## 2016-05-10 NOTE — Progress Notes (Signed)
03/08/16- EKG and CXR- EPIC

## 2016-05-16 NOTE — Anesthesia Preprocedure Evaluation (Addendum)
Anesthesia Evaluation  Patient identified by MRN, date of birth, ID band Patient awake    Reviewed: Allergy & Precautions, NPO status , Patient's Chart, lab work & pertinent test results  Airway Mallampati: III  TM Distance: >3 FB Neck ROM: Full    Dental  (+) Dental Advisory Given   Pulmonary asthma , sleep apnea and Continuous Positive Airway Pressure Ventilation , former smoker,    breath sounds clear to auscultation       Cardiovascular hypertension, Pt. on medications  Rhythm:Regular Rate:Normal     Neuro/Psych Depression    GI/Hepatic negative GI ROS, Neg liver ROS,   Endo/Other  negative endocrine ROS  Renal/GU negative Renal ROS     Musculoskeletal  (+) Arthritis ,   Abdominal   Peds  Hematology negative hematology ROS (+)   Anesthesia Other Findings   Reproductive/Obstetrics                            Lab Results  Component Value Date   WBC 7.2 05/10/2016   HGB 13.3 05/10/2016   HCT 40.9 05/10/2016   MCV 98.8 05/10/2016   PLT 198 05/10/2016   Lab Results  Component Value Date   CREATININE 0.71 05/10/2016   BUN 12 05/10/2016   NA 140 05/10/2016   K 4.3 05/10/2016   CL 103 05/10/2016   CO2 29 05/10/2016   No results found for: INR, PROTIME  Anesthesia Physical Anesthesia Plan  ASA: III  Anesthesia Plan: Spinal and MAC   Post-op Pain Management:    Induction: Intravenous  Airway Management Planned: Natural Airway and Simple Face Mask  Additional Equipment:   Intra-op Plan:   Post-operative Plan:   Informed Consent: I have reviewed the patients History and Physical, chart, labs and discussed the procedure including the risks, benefits and alternatives for the proposed anesthesia with the patient or authorized representative who has indicated his/her understanding and acceptance.   Dental advisory given  Plan Discussed with: CRNA  Anesthesia Plan  Comments: (Plan for spinal with GETA as a back up. Standard monitors.)       Anesthesia Quick Evaluation

## 2016-05-17 ENCOUNTER — Inpatient Hospital Stay (HOSPITAL_COMMUNITY): Payer: Medicare Other

## 2016-05-17 ENCOUNTER — Inpatient Hospital Stay (HOSPITAL_COMMUNITY): Payer: Medicare Other | Admitting: Anesthesiology

## 2016-05-17 ENCOUNTER — Encounter (HOSPITAL_COMMUNITY)
Admission: RE | Disposition: A | Discharge status: Skilled Nursing Facility | Payer: Self-pay | Source: Ambulatory Visit | Attending: Orthopaedic Surgery

## 2016-05-17 ENCOUNTER — Encounter (HOSPITAL_COMMUNITY): Payer: Self-pay

## 2016-05-17 ENCOUNTER — Inpatient Hospital Stay (HOSPITAL_COMMUNITY)
Admission: RE | Admit: 2016-05-17 | Discharge: 2016-05-20 | DRG: 470 | Disposition: A | Discharge status: Skilled Nursing Facility | Payer: Medicare Other | Source: Ambulatory Visit | Attending: Orthopaedic Surgery | Admitting: Orthopaedic Surgery

## 2016-05-17 DIAGNOSIS — E669 Obesity, unspecified: Secondary | ICD-10-CM | POA: Diagnosis present

## 2016-05-17 DIAGNOSIS — E785 Hyperlipidemia, unspecified: Secondary | ICD-10-CM | POA: Diagnosis present

## 2016-05-17 DIAGNOSIS — M1712 Unilateral primary osteoarthritis, left knee: Secondary | ICD-10-CM | POA: Diagnosis present

## 2016-05-17 DIAGNOSIS — Z7983 Long term (current) use of bisphosphonates: Secondary | ICD-10-CM | POA: Diagnosis not present

## 2016-05-17 DIAGNOSIS — Z79899 Other long term (current) drug therapy: Secondary | ICD-10-CM | POA: Diagnosis not present

## 2016-05-17 DIAGNOSIS — M2100 Valgus deformity, not elsewhere classified, unspecified site: Secondary | ICD-10-CM | POA: Diagnosis present

## 2016-05-17 DIAGNOSIS — J45909 Unspecified asthma, uncomplicated: Secondary | ICD-10-CM | POA: Diagnosis present

## 2016-05-17 DIAGNOSIS — I10 Essential (primary) hypertension: Secondary | ICD-10-CM | POA: Diagnosis present

## 2016-05-17 DIAGNOSIS — Z6839 Body mass index (BMI) 39.0-39.9, adult: Secondary | ICD-10-CM | POA: Diagnosis not present

## 2016-05-17 DIAGNOSIS — M25562 Pain in left knee: Secondary | ICD-10-CM | POA: Diagnosis present

## 2016-05-17 DIAGNOSIS — G4730 Sleep apnea, unspecified: Secondary | ICD-10-CM | POA: Diagnosis present

## 2016-05-17 DIAGNOSIS — Z87891 Personal history of nicotine dependence: Secondary | ICD-10-CM

## 2016-05-17 DIAGNOSIS — M419 Scoliosis, unspecified: Secondary | ICD-10-CM | POA: Diagnosis present

## 2016-05-17 DIAGNOSIS — Z96652 Presence of left artificial knee joint: Secondary | ICD-10-CM

## 2016-05-17 DIAGNOSIS — Z7982 Long term (current) use of aspirin: Secondary | ICD-10-CM | POA: Diagnosis not present

## 2016-05-17 DIAGNOSIS — F329 Major depressive disorder, single episode, unspecified: Secondary | ICD-10-CM | POA: Diagnosis present

## 2016-05-17 HISTORY — PX: TOTAL KNEE ARTHROPLASTY: SHX125

## 2016-05-17 SURGERY — ARTHROPLASTY, KNEE, TOTAL
Anesthesia: Monitor Anesthesia Care | Site: Knee | Laterality: Left

## 2016-05-17 MED ORDER — HYDROMORPHONE HCL 2 MG/ML IJ SOLN
INTRAMUSCULAR | Status: AC
  Filled 2016-05-17: qty 1

## 2016-05-17 MED ORDER — ALBUTEROL SULFATE (2.5 MG/3ML) 0.083% IN NEBU: 2.5000 mg | INHALATION_SOLUTION | RESPIRATORY_TRACT | Status: DC | PRN

## 2016-05-17 MED ORDER — DEXAMETHASONE SODIUM PHOSPHATE 10 MG/ML IJ SOLN
INTRAMUSCULAR | Status: DC | PRN
  Administered 2016-05-17: 10 mg via INTRAVENOUS

## 2016-05-17 MED ORDER — LACTATED RINGERS IV SOLN: INTRAVENOUS | Status: DC

## 2016-05-17 MED ORDER — FENTANYL 50 MCG/HR TD PT72: 75.0000 ug | MEDICATED_PATCH | TRANSDERMAL | Status: DC

## 2016-05-17 MED ORDER — ONDANSETRON HCL 4 MG/2ML IJ SOLN: 4.0000 mg | Freq: Four times a day (QID) | INTRAMUSCULAR | Status: DC | PRN

## 2016-05-17 MED ORDER — FENTANYL CITRATE (PF) 100 MCG/2ML IJ SOLN
INTRAMUSCULAR | Status: AC
  Filled 2016-05-17: qty 2

## 2016-05-17 MED ORDER — ALUM & MAG HYDROXIDE-SIMETH 200-200-20 MG/5ML PO SUSP: 30.0000 mL | ORAL | Status: DC | PRN

## 2016-05-17 MED ORDER — OXYCODONE HCL 5 MG PO TABS
5.0000 mg | ORAL_TABLET | ORAL | Status: DC | PRN
  Administered 2016-05-17: 10 mg via ORAL
  Administered 2016-05-17: 15 mg via ORAL
  Administered 2016-05-17 (×2): 5 mg via ORAL
  Administered 2016-05-17: 15 mg via ORAL
  Administered 2016-05-17: 5 mg via ORAL
  Administered 2016-05-18: 10 mg via ORAL
  Administered 2016-05-18: 15 mg via ORAL
  Administered 2016-05-18: 10 mg via ORAL
  Administered 2016-05-18 (×2): 15 mg via ORAL
  Administered 2016-05-19: 5 mg via ORAL
  Administered 2016-05-19: 10 mg via ORAL
  Administered 2016-05-20: 5 mg via ORAL
  Filled 2016-05-17: qty 1
  Filled 2016-05-17: qty 3
  Filled 2016-05-17: qty 2
  Filled 2016-05-17 (×2): qty 1
  Filled 2016-05-17: qty 3
  Filled 2016-05-17 (×2): qty 2
  Filled 2016-05-17 (×2): qty 3
  Filled 2016-05-17: qty 1
  Filled 2016-05-17: qty 2
  Filled 2016-05-17: qty 1
  Filled 2016-05-17: qty 3

## 2016-05-17 MED ORDER — BUSPIRONE HCL 10 MG PO TABS
30.0000 mg | ORAL_TABLET | Freq: Two times a day (BID) | ORAL | Status: DC
  Administered 2016-05-17 – 2016-05-20 (×5): 30 mg via ORAL
  Filled 2016-05-17: qty 3
  Filled 2016-05-17 (×3): qty 6
  Filled 2016-05-17: qty 3

## 2016-05-17 MED ORDER — ATORVASTATIN CALCIUM 20 MG PO TABS
20.0000 mg | ORAL_TABLET | Freq: Every day | ORAL | Status: DC
  Administered 2016-05-17 – 2016-05-20 (×4): 20 mg via ORAL
  Filled 2016-05-17 (×4): qty 1

## 2016-05-17 MED ORDER — ONDANSETRON HCL 4 MG/2ML IJ SOLN
INTRAMUSCULAR | Status: DC | PRN
  Administered 2016-05-17: 4 mg via INTRAVENOUS

## 2016-05-17 MED ORDER — HYDROMORPHONE HCL 1 MG/ML IJ SOLN
0.2500 mg | INTRAMUSCULAR | Status: DC | PRN
  Administered 2016-05-17 (×2): 0.5 mg via INTRAVENOUS

## 2016-05-17 MED ORDER — ACETAMINOPHEN 650 MG RE SUPP
650.0000 mg | Freq: Four times a day (QID) | RECTAL | Status: DC | PRN
Start: 2016-05-17 — End: 2016-05-20

## 2016-05-17 MED ORDER — MECLIZINE HCL 25 MG PO TABS
25.0000 mg | ORAL_TABLET | Freq: Three times a day (TID) | ORAL | Status: DC | PRN
  Filled 2016-05-17: qty 1

## 2016-05-17 MED ORDER — 0.9 % SODIUM CHLORIDE (POUR BTL) OPTIME
TOPICAL | Status: DC | PRN
  Administered 2016-05-17: 1000 mL

## 2016-05-17 MED ORDER — FUROSEMIDE 40 MG PO TABS: 40.0000 mg | ORAL_TABLET | Freq: Every day | ORAL | Status: DC | PRN

## 2016-05-17 MED ORDER — CEFAZOLIN SODIUM-DEXTROSE 2-4 GM/100ML-% IV SOLN
2.0000 g | INTRAVENOUS | Status: AC
  Administered 2016-05-17: 2 g via INTRAVENOUS

## 2016-05-17 MED ORDER — POLYETHYLENE GLYCOL 3350 17 G PO PACK: 17.0000 g | PACK | Freq: Every day | ORAL | Status: DC | PRN

## 2016-05-17 MED ORDER — ACETAMINOPHEN 325 MG PO TABS
650.0000 mg | ORAL_TABLET | Freq: Four times a day (QID) | ORAL | Status: DC | PRN
Start: 2016-05-17 — End: 2016-05-20
  Administered 2016-05-17 – 2016-05-20 (×4): 650 mg via ORAL
  Filled 2016-05-17 (×5): qty 2

## 2016-05-17 MED ORDER — ADULT MULTIVITAMIN W/MINERALS CH
1.0000 | ORAL_TABLET | Freq: Every day | ORAL | Status: DC
  Administered 2016-05-17 – 2016-05-20 (×4): 1 via ORAL
  Filled 2016-05-17 (×4): qty 1

## 2016-05-17 MED ORDER — CEFAZOLIN IN D5W 1 GM/50ML IV SOLN
1.0000 g | Freq: Four times a day (QID) | INTRAVENOUS | Status: AC
  Administered 2016-05-17 (×2): 1 g via INTRAVENOUS
  Filled 2016-05-17 (×2): qty 50

## 2016-05-17 MED ORDER — ONDANSETRON HCL 4 MG PO TABS: 4.0000 mg | ORAL_TABLET | Freq: Four times a day (QID) | ORAL | Status: DC | PRN

## 2016-05-17 MED ORDER — LIDOCAINE 2% (20 MG/ML) 5 ML SYRINGE
INTRAMUSCULAR | Status: AC
  Filled 2016-05-17: qty 5

## 2016-05-17 MED ORDER — SUGAMMADEX SODIUM 200 MG/2ML IV SOLN
INTRAVENOUS | Status: DC | PRN
  Administered 2016-05-17: 200 mg via INTRAVENOUS

## 2016-05-17 MED ORDER — DONEPEZIL HCL 10 MG PO TABS
10.0000 mg | ORAL_TABLET | Freq: Every day | ORAL | Status: DC
  Administered 2016-05-17 – 2016-05-19 (×3): 10 mg via ORAL
  Filled 2016-05-17 (×3): qty 1

## 2016-05-17 MED ORDER — PHENOL 1.4 % MT LIQD: 1.0000 | OROMUCOSAL | Status: DC | PRN

## 2016-05-17 MED ORDER — HYDROCODONE-ACETAMINOPHEN 7.5-325 MG PO TABS: 1.0000 | ORAL_TABLET | Freq: Once | ORAL | Status: DC | PRN

## 2016-05-17 MED ORDER — POTASSIUM CHLORIDE ER 10 MEQ PO TBCR
10.0000 meq | EXTENDED_RELEASE_TABLET | Freq: Two times a day (BID) | ORAL | Status: DC | PRN
  Filled 2016-05-17: qty 1

## 2016-05-17 MED ORDER — HYDROMORPHONE HCL 1 MG/ML IJ SOLN
1.0000 mg | INTRAMUSCULAR | Status: DC | PRN
  Administered 2016-05-17: 1 mg via INTRAVENOUS
  Filled 2016-05-17: qty 1

## 2016-05-17 MED ORDER — ONDANSETRON HCL 4 MG/2ML IJ SOLN
INTRAMUSCULAR | Status: AC
  Filled 2016-05-17: qty 2

## 2016-05-17 MED ORDER — ROCURONIUM BROMIDE 10 MG/ML (PF) SYRINGE
PREFILLED_SYRINGE | INTRAVENOUS | Status: AC
  Filled 2016-05-17: qty 10

## 2016-05-17 MED ORDER — ALBUTEROL SULFATE HFA 108 (90 BASE) MCG/ACT IN AERS: 2.0000 | INHALATION_SPRAY | RESPIRATORY_TRACT | Status: DC | PRN

## 2016-05-17 MED ORDER — MENTHOL 3 MG MT LOZG: 1.0000 | LOZENGE | OROMUCOSAL | Status: DC | PRN

## 2016-05-17 MED ORDER — SODIUM CHLORIDE 0.9 % IV SOLN
INTRAVENOUS | Status: DC
  Administered 2016-05-17: 75 mL/h via INTRAVENOUS
  Administered 2016-05-18: 02:00:00 via INTRAVENOUS

## 2016-05-17 MED ORDER — DIPHENHYDRAMINE HCL 12.5 MG/5ML PO ELIX: 12.5000 mg | ORAL_SOLUTION | ORAL | Status: DC | PRN

## 2016-05-17 MED ORDER — PROPOFOL 10 MG/ML IV BOLUS
INTRAVENOUS | Status: DC | PRN
  Administered 2016-05-17: 160 mg via INTRAVENOUS

## 2016-05-17 MED ORDER — STERILE WATER FOR IRRIGATION IR SOLN
Status: DC | PRN
  Administered 2016-05-17: 2000 mL

## 2016-05-17 MED ORDER — HYDROMORPHONE HCL 1 MG/ML IJ SOLN
INTRAMUSCULAR | Status: DC | PRN
  Administered 2016-05-17 (×2): 1 mg via INTRAVENOUS

## 2016-05-17 MED ORDER — SODIUM CHLORIDE 0.9 % IR SOLN
Status: DC | PRN
  Administered 2016-05-17: 2000 mL

## 2016-05-17 MED ORDER — METOCLOPRAMIDE HCL 5 MG PO TABS: 5.0000 mg | ORAL_TABLET | Freq: Three times a day (TID) | ORAL | Status: DC | PRN

## 2016-05-17 MED ORDER — METHOCARBAMOL 500 MG PO TABS
500.0000 mg | ORAL_TABLET | Freq: Four times a day (QID) | ORAL | Status: DC | PRN
  Administered 2016-05-17 – 2016-05-20 (×6): 500 mg via ORAL
  Filled 2016-05-17 (×6): qty 1

## 2016-05-17 MED ORDER — LIDOCAINE 2% (20 MG/ML) 5 ML SYRINGE
INTRAMUSCULAR | Status: DC | PRN
  Administered 2016-05-17: 100 mg via INTRAVENOUS

## 2016-05-17 MED ORDER — PROPOFOL 10 MG/ML IV BOLUS
INTRAVENOUS | Status: AC
  Filled 2016-05-17: qty 20

## 2016-05-17 MED ORDER — FENTANYL 75 MCG/HR TD PT72
75.0000 ug | MEDICATED_PATCH | TRANSDERMAL | Status: DC
  Administered 2016-05-17: 75 ug via TRANSDERMAL
  Filled 2016-05-17: qty 1

## 2016-05-17 MED ORDER — ROPIVACAINE HCL 7.5 MG/ML IJ SOLN
INTRAMUSCULAR | Status: DC | PRN
  Administered 2016-05-17: 15 mL via PERINEURAL

## 2016-05-17 MED ORDER — METOCLOPRAMIDE HCL 5 MG/ML IJ SOLN: 5.0000 mg | Freq: Three times a day (TID) | INTRAMUSCULAR | Status: DC | PRN

## 2016-05-17 MED ORDER — RIVAROXABAN 10 MG PO TABS
10.0000 mg | ORAL_TABLET | Freq: Every day | ORAL | Status: DC
  Administered 2016-05-18 – 2016-05-20 (×3): 10 mg via ORAL
  Filled 2016-05-17 (×3): qty 1

## 2016-05-17 MED ORDER — HYDROMORPHONE HCL 1 MG/ML IJ SOLN
INTRAMUSCULAR | Status: AC
  Administered 2016-05-17: 0.5 mg via INTRAVENOUS
  Filled 2016-05-17: qty 1

## 2016-05-17 MED ORDER — SUGAMMADEX SODIUM 200 MG/2ML IV SOLN
INTRAVENOUS | Status: AC
  Filled 2016-05-17: qty 2

## 2016-05-17 MED ORDER — FENTANYL CITRATE (PF) 100 MCG/2ML IJ SOLN
INTRAMUSCULAR | Status: DC | PRN
  Administered 2016-05-17 (×4): 50 ug via INTRAVENOUS

## 2016-05-17 MED ORDER — FENTANYL CITRATE (PF) 100 MCG/2ML IJ SOLN
INTRAMUSCULAR | Status: AC
Start: 2016-05-17 — End: 2016-05-17
  Filled 2016-05-17: qty 2

## 2016-05-17 MED ORDER — ARIPIPRAZOLE 5 MG PO TABS
5.0000 mg | ORAL_TABLET | Freq: Every day | ORAL | Status: DC
  Administered 2016-05-18 – 2016-05-20 (×3): 5 mg via ORAL
  Filled 2016-05-17 (×3): qty 1

## 2016-05-17 MED ORDER — DOCUSATE SODIUM 100 MG PO CAPS
100.0000 mg | ORAL_CAPSULE | Freq: Two times a day (BID) | ORAL | Status: DC
  Administered 2016-05-17 – 2016-05-20 (×6): 100 mg via ORAL
  Filled 2016-05-17 (×6): qty 1

## 2016-05-17 MED ORDER — DEXAMETHASONE SODIUM PHOSPHATE 10 MG/ML IJ SOLN
INTRAMUSCULAR | Status: AC
  Filled 2016-05-17: qty 1

## 2016-05-17 MED ORDER — PROMETHAZINE HCL 25 MG/ML IJ SOLN: 6.2500 mg | INTRAMUSCULAR | Status: DC | PRN

## 2016-05-17 MED ORDER — LACTATED RINGERS IV SOLN
INTRAVENOUS | Status: DC | PRN
  Administered 2016-05-17 (×2): via INTRAVENOUS

## 2016-05-17 MED ORDER — METHOCARBAMOL 1000 MG/10ML IJ SOLN
500.0000 mg | Freq: Four times a day (QID) | INTRAVENOUS | Status: DC | PRN
  Administered 2016-05-17: 500 mg via INTRAVENOUS
  Filled 2016-05-17: qty 550
  Filled 2016-05-17: qty 5

## 2016-05-17 MED ORDER — VENLAFAXINE HCL ER 75 MG PO CP24
75.0000 mg | ORAL_CAPSULE | Freq: Every day | ORAL | Status: DC
  Administered 2016-05-18 – 2016-05-20 (×3): 75 mg via ORAL
  Filled 2016-05-17 (×3): qty 1

## 2016-05-17 MED ORDER — CEFAZOLIN SODIUM-DEXTROSE 2-4 GM/100ML-% IV SOLN
INTRAVENOUS | Status: AC
  Filled 2016-05-17: qty 100

## 2016-05-17 MED ORDER — TRANEXAMIC ACID 1000 MG/10ML IV SOLN
1000.0000 mg | INTRAVENOUS | Status: AC
  Administered 2016-05-17: 1000 mg via INTRAVENOUS
  Filled 2016-05-17: qty 1100

## 2016-05-17 MED ORDER — PREDNISOLONE ACETATE 1 % OP SUSP: 1.0000 [drp] | Freq: Three times a day (TID) | OPHTHALMIC | Status: DC | PRN

## 2016-05-17 SURGICAL SUPPLY — 53 items
BAG ZIPLOCK 12X15 (MISCELLANEOUS) IMPLANT
BANDAGE ACE 6X5 VEL STRL LF (GAUZE/BANDAGES/DRESSINGS) ×6 IMPLANT
BENZOIN TINCTURE PRP APPL 2/3 (GAUZE/BANDAGES/DRESSINGS) ×3 IMPLANT
BLADE SAG 18X100X1.27 (BLADE) ×3 IMPLANT
BOWL SMART MIX CTS (DISPOSABLE) ×3 IMPLANT
CAPT KNEE TOTAL 3 ×3 IMPLANT
CEMENT BONE SIMPLEX SPEEDSET (Cement) ×6 IMPLANT
CLOSURE WOUND 1/2 X4 (GAUZE/BANDAGES/DRESSINGS) ×2
CLOTH BEACON ORANGE TIMEOUT ST (SAFETY) ×3 IMPLANT
CUFF TOURN SGL QUICK 34 (TOURNIQUET CUFF) ×2
CUFF TRNQT CYL 34X4X40X1 (TOURNIQUET CUFF) ×1 IMPLANT
DRAPE U-SHAPE 47X51 STRL (DRAPES) ×3 IMPLANT
DURAPREP 26ML APPLICATOR (WOUND CARE) ×3 IMPLANT
ELECT REM PT RETURN 9FT ADLT (ELECTROSURGICAL) ×3
ELECTRODE REM PT RTRN 9FT ADLT (ELECTROSURGICAL) ×1 IMPLANT
GAUZE SPONGE 4X4 12PLY STRL (GAUZE/BANDAGES/DRESSINGS) ×3 IMPLANT
GLOVE BIO SURGEON STRL SZ7.5 (GLOVE) ×3 IMPLANT
GLOVE BIOGEL PI IND STRL 6.5 (GLOVE) ×1 IMPLANT
GLOVE BIOGEL PI IND STRL 7.5 (GLOVE) ×3 IMPLANT
GLOVE BIOGEL PI IND STRL 8 (GLOVE) ×2 IMPLANT
GLOVE BIOGEL PI INDICATOR 6.5 (GLOVE) ×2
GLOVE BIOGEL PI INDICATOR 7.5 (GLOVE) ×6
GLOVE BIOGEL PI INDICATOR 8 (GLOVE) ×4
GLOVE ECLIPSE 8.0 STRL XLNG CF (GLOVE) ×3 IMPLANT
GLOVE SURG SS PI 6.5 STRL IVOR (GLOVE) ×3 IMPLANT
GLOVE SURG SS PI 7.5 STRL IVOR (GLOVE) ×3 IMPLANT
GOWN SPEC L3 XXLG W/TWL (GOWN DISPOSABLE) ×3 IMPLANT
GOWN STRL REUS W/TWL XL LVL3 (GOWN DISPOSABLE) ×9 IMPLANT
HANDPIECE INTERPULSE COAX TIP (DISPOSABLE) ×2
IMMOBILIZER KNEE 20 (SOFTGOODS) ×3
IMMOBILIZER KNEE 20 THIGH 36 (SOFTGOODS) ×1 IMPLANT
INSERT TIB 4 16XTL STAB + STRL (Insert) ×1 IMPLANT
KNEE INSERT TIB 4X16 (Insert) ×2 IMPLANT
PACK TOTAL KNEE CUSTOM (KITS) ×3 IMPLANT
PAD ABD 8X10 STRL (GAUZE/BANDAGES/DRESSINGS) ×3 IMPLANT
PADDING CAST COTTON 6X4 STRL (CAST SUPPLIES) ×3 IMPLANT
POSITIONER SURGICAL ARM (MISCELLANEOUS) ×3 IMPLANT
SET HNDPC FAN SPRY TIP SCT (DISPOSABLE) ×1 IMPLANT
SET PAD KNEE POSITIONER (MISCELLANEOUS) ×3 IMPLANT
STAPLER VISISTAT 35W (STAPLE) IMPLANT
STRIP CLOSURE SKIN 1/2X4 (GAUZE/BANDAGES/DRESSINGS) ×4 IMPLANT
SUCTION FRAZIER HANDLE 12FR (TUBING) ×2
SUCTION TUBE FRAZIER 12FR DISP (TUBING) ×1 IMPLANT
SUT MNCRL AB 4-0 PS2 18 (SUTURE) ×3 IMPLANT
SUT VIC AB 0 CT1 27 (SUTURE) ×2
SUT VIC AB 0 CT1 27XBRD ANTBC (SUTURE) ×1 IMPLANT
SUT VIC AB 1 CT1 27 (SUTURE) ×4
SUT VIC AB 1 CT1 27XBRD ANTBC (SUTURE) ×2 IMPLANT
SUT VIC AB 2-0 CT1 27 (SUTURE) ×4
SUT VIC AB 2-0 CT1 TAPERPNT 27 (SUTURE) ×2 IMPLANT
TRAY FOLEY CATH SILVER 14FR (SET/KITS/TRAYS/PACK) ×3 IMPLANT
WRAP KNEE MAXI GEL POST OP (GAUZE/BANDAGES/DRESSINGS) ×3 IMPLANT
YANKAUER SUCT BULB TIP 10FT TU (MISCELLANEOUS) ×3 IMPLANT

## 2016-05-17 NOTE — Anesthesia Procedure Notes (Signed)
Procedure Name: Intubation Date/Time: 05/17/2016 7:48 AM Performed by: Enriqueta Shutter D Pre-anesthesia Checklist: Patient identified, Emergency Drugs available, Suction available and Patient being monitored Patient Re-evaluated:Patient Re-evaluated prior to inductionOxygen Delivery Method: Circle system utilized Preoxygenation: Pre-oxygenation with 100% oxygen Intubation Type: IV induction Ventilation: Mask ventilation without difficulty Laryngoscope Size: Mac and 3 Grade View: Grade II Tube type: Oral Tube size: 7.5 mm Number of attempts: 1 Airway Equipment and Method: Stylet Placement Confirmation: ETT inserted through vocal cords under direct vision,  positive ETCO2 and breath sounds checked- equal and bilateral Secured at: 22 cm Tube secured with: Tape Dental Injury: Teeth and Oropharynx as per pre-operative assessment

## 2016-05-17 NOTE — Anesthesia Procedure Notes (Signed)
Anesthesia Regional Block:  Adductor canal block  Pre-Anesthetic Checklist: ,, timeout performed, Correct Patient, Correct Site, Correct Laterality, Correct Procedure, Correct Position, site marked, Risks and benefits discussed,  Surgical consent,  Pre-op evaluation,  At surgeon's request and post-op pain management  Laterality: Left  Prep: chloraprep       Needles:  Injection technique: Single-shot  Needle Type: Echogenic Needle     Needle Length: 9cm 9 cm Needle Gauge: 21 and 21 G    Additional Needles:  Procedures: ultrasound guided (picture in chart) Adductor canal block Narrative:  Start time: 05/17/2016 7:12 AM End time: 05/17/2016 7:18 AM Injection made incrementally with aspirations every 5 mL.  Performed by: Personally  Anesthesiologist: Marcene Duos

## 2016-05-17 NOTE — H&P (Signed)
TOTAL KNEE ADMISSION H&P  Patient is being admitted for left total knee arthroplasty.  Subjective:  Chief Complaint:left knee pain.  HPI: [name], 75 y.o. female, has a history of pain and functional disability in the left knee due to arthritis and has failed non-surgical conservative treatments for greater than 12 weeks to includeNSAID's and/or analgesics, corticosteriod injections, viscosupplementation injections, flexibility and strengthening excercises, use of assistive devices, weight reduction as appropriate and activity modification.  Onset of symptoms was gradual, starting 5 years ago with gradually worsening course since that time. The patient noted no past surgery on the left knee(s).  Patient currently rates pain in the left knee(s) at 10 out of 10 with activity. Patient has night pain, worsening of pain with activity and weight bearing, pain that interferes with activities of daily living, pain with passive range of motion, crepitus and joint swelling.  Patient has evidence of subchondral sclerosis, periarticular osteophytes and joint space narrowing by imaging studies. There is no active infection.  Patient Active Problem List   Diagnosis Date Noted  . Osteoarthritis of left knee 05/17/2016   Past Medical History:  Diagnosis Date  . Arthritis   . Asthma   . Chronic back pain   . Depression   . Hyperlipemia   . Hypertension   . Scoliosis   . Sleep apnea    cpap - settingsi at 2     Past Surgical History:  Procedure Laterality Date  . ARTHROSCOPIC REPAIR ACL  02/03/2004  . OOPHORECTOMY  1970  . SPINE SURGERY  03/31/2006  . TUBAL LIGATION  1983    Prescriptions Prior to Admission  Medication Sig Dispense Refill Last Dose  . alendronate (FOSAMAX) 70 MG tablet Take 70 mg by mouth every Sunday. Take with a full glass of water on an empty stomach.   05/12/2016 at Unknown time  . ARIPiprazole (ABILIFY) 5 MG tablet Take 5 mg by mouth daily.  0 05/17/2016 at 0430  .  aspirin EC 325 MG tablet Take 325 mg by mouth daily.    05/16/2016 at 1100  . atorvastatin (LIPITOR) 20 MG tablet Take 20 mg by mouth daily.   Past Week at Unknown time  . busPIRone (BUSPAR) 30 MG tablet Take 30 mg by mouth 2 (two) times daily.  0 05/17/2016 at 0430  . desvenlafaxine (PRISTIQ) 50 MG 24 hr tablet Take 50 mg by mouth daily.    05/17/2016 at 0430  . donepezil (ARICEPT) 10 MG tablet Take 10 mg by mouth at bedtime.   05/16/2016 at 2000  . fentaNYL (DURAGESIC - DOSED MCG/HR) 75 MCG/HR Place 75 mcg onto the skin every 3 (three) days.   0 05/17/2016 at Unknown time  . fexofenadine (ALLEGRA) 180 MG tablet Take 180 mg by mouth daily as needed for allergies.    Past Month at Unknown time  . furosemide (LASIX) 40 MG tablet Take 40 mg by mouth daily as needed for fluid.    05/16/2016 at 1100  . lisinopril (PRINIVIL,ZESTRIL) 40 MG tablet Take 40 mg by mouth daily.    05/16/2016 at Unknown time  . meclizine (ANTIVERT) 25 MG tablet Take 25 mg by mouth 3 (three) times daily as needed for dizziness.   Past Week at Unknown time  . Multiple Vitamin (MULTIVITAMIN WITH MINERALS) TABS tablet Take 1 tablet by mouth daily.   05/16/2016 at 1100  . oxyCODONE (ROXICODONE) 15 MG immediate release tablet Take 15 mg by mouth every 3 (three) hours as needed for  pain.    05/16/2016 at 1100  . potassium chloride (KLOR-CON 10) 10 MEQ tablet Take 10 mEq by mouth 2 (two) times daily as needed (for swelling).    05/16/2016 at 1100  . prednisoLONE acetate (PRED FORTE) 1 % ophthalmic suspension Place 1 drop into both eyes 3 (three) times daily as needed (for dry eyes).    05/16/2016 at Unknown time  . PROAIR HFA 108 (90 BASE) MCG/ACT inhaler Inhale 2 puffs into the lungs every 4 (four) hours as needed for wheezing or shortness of breath.   0 Past Week at Unknown time   Allergies  Allergen Reactions  . Codeine Itching    Social History  Substance Use Topics  . Smoking status: Former Smoker    Packs/day: 1.00    Years: 10.00     Types: Cigarettes    Quit date: 08/12/1973  . Smokeless tobacco: Never Used  . Alcohol use No    Family History  Problem Relation Age of Onset  . Other Father     kidney problems     Review of Systems  Musculoskeletal: Positive for joint pain.  All other systems reviewed and are negative.   Objective:  Physical Exam  Constitutional: She is oriented to person, place, and time. She appears well-developed and well-nourished.  HENT:  Head: Normocephalic and atraumatic.  Eyes: EOM are normal. Pupils are equal, round, and reactive to light.  Neck: Normal range of motion. Neck supple.  Cardiovascular: Normal rate and regular rhythm.   Respiratory: Effort normal and breath sounds normal.  GI: Soft. Bowel sounds are normal.  Musculoskeletal:       Left knee: She exhibits decreased range of motion, swelling, effusion and bony tenderness. Tenderness found. Medial joint line and lateral joint line tenderness noted.  Neurological: She is alert and oriented to person, place, and time.  Skin: Skin is warm and dry.  Psychiatric: She has a normal mood and affect.    Vital signs in last 24 hours: Temp:  [98.1 F (36.7 C)] 98.1 F (36.7 C) (10/06 0551) Pulse Rate:  [57] 57 (10/06 0551) Resp:  [18] 18 (10/06 0551) BP: (179)/(85) 179/85 (10/06 0551) SpO2:  [95 %] 95 % (10/06 0551)  Labs:   Estimated body mass index is 39.32 kg/m as calculated from the following:   Height as of 05/10/16: 5\' 2"  (1.575 m).   Weight as of 05/10/16: 97.5 kg (215 lb).   Imaging Review Plain radiographs demonstrate severe degenerative joint disease of the left knee(s). The overall alignment ismild valgus. The bone quality appears to be good for age and reported activity level.  Assessment/Plan:  End stage arthritis, left knee   The patient history, physical examination, clinical judgment of the provider and imaging studies are consistent with end stage degenerative joint disease of the left knee(s) and  total knee arthroplasty is deemed medically necessary. The treatment options including medical management, injection therapy arthroscopy and arthroplasty were discussed at length. The risks and benefits of total knee arthroplasty were presented and reviewed. The risks due to aseptic loosening, infection, stiffness, patella tracking problems, thromboembolic complications and other imponderables were discussed. The patient acknowledged the explanation, agreed to proceed with the plan and consent was signed. Patient is being admitted for inpatient treatment for surgery, pain control, PT, OT, prophylactic antibiotics, VTE prophylaxis, progressive ambulation and ADL's and discharge planning. The patient is planning to be discharged home with home health services vs SNF pending progress with therapy in the hospital

## 2016-05-17 NOTE — Transfer of Care (Signed)
Immediate Anesthesia Transfer of Care Note  Patient: [name]  Procedure(s) Performed: Procedure(s) with comments: LEFT TOTAL KNEE ARTHROPLASTY (Left) - Adductor Block; Spinal to General  Patient Location: PACU  Anesthesia Type:General  Level of Consciousness: awake, alert  and oriented  Airway & Oxygen Therapy: Patient Spontanous Breathing and Patient connected to face mask oxygen  Post-op Assessment: Report given to RN and Post -op Vital signs reviewed and stable  Post vital signs: Reviewed and stable  Last Vitals:  Vitals:   05/17/16 0551  BP: (!) 179/85  Pulse: (!) 57  Resp: 18  Temp: 36.7 C    Last Pain:  Vitals:   05/17/16 0551  TempSrc: Oral         Complications: No apparent anesthesia complications

## 2016-05-17 NOTE — Op Note (Signed)
NAME:  Newell CoralMCNEIL, Kaelin              ACCOUNT NO.:  [id number]  MEDICAL RECORD NO.:  [id number]  LOCATION:  [facility]                         F[facility]  PHYSICIAN:  Vanita PandaChristopher Y. 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  PROCEDURE:  Left total knee arthroplasty.  IMPLANTS:  Stryker Triathlon knee with size 4 femur, size 4 tibial tray, 16 mm constrained polyethylene insert, size 32 patellar button.  SURGEON:  Vanita PandaChristopher Y. Magnus IvanBlackman, M.D.  ASSISTANT:  Richardean CanalGilbert Clark, PA-C.  ANESTHESIA: 1. Left lower extremity adductor canal block. 2. Attempted spinal anesthesia. 3. General anesthesia.  TOURNIQUET TIME:  Under 1 hour.  ANTIBIOTICS:  2 g of IV Ancef.  BLOOD LOSS:  Less than 100 mL.  COMPLICATIONS:  None.  INDICATIONS:  Ms. Uvaldo RisingMcNeil is a 75 year old patient, well known to me. She has debilitating tricompartmental arthritis of her left knee involving mainly the lateral compartment with slight valgus deformity. She has tried and failed all forms of conservative treatment.  Her pain is daily and it has detrimentally affected her activities of daily living, her quality of life, and her mobility.  She is at the point she does wish to proceed with a total knee arthroplasty given the failure of conservative treatment.  She understands the risk of acute blood loss anemia, nerve and vessel injury, fracture, infection, and DVT.  She understands our goals are to decrease pain, improve mobility, and overall improve quality of life.  PROCEDURE DESCRIPTION:  After informed consent was obtained, appropriate left knee was marked.  Adductor canal block  was obtained.  She was brought to the operating room and placed supine on the operating table. She was then sat up and a spinal anesthesia was attempted.  Due to severe deformity of her back and previous back surgery that was unsuccessful, she was laid back in the supine position and general anesthesia was obtained.  A nonsterile tourniquet was placed around her upper left thigh and her left leg was prepped and draped from the thigh down to the ankle with DuraPrep and sterile drapes including a sterile stockinette.  Time-out was called and she was identified as correct patient, correct left knee.  An Esmarch was used to wrap out the leg and tourniquet was inflated to 300 mmHg of pressure.  I then made a direct midline incision over the patella and carried this proximally and distally.  We dissected down the knee joint, carried out a medial parapatellar arthrotomy, finding a very large joint effusion, valgus deformity of her knee, and significant wear mainly on the lateral compartment of her knee.  We removed remnants of the ACL, PCL, medial and lateral meniscus, as well as osteophytes from throughout the knee. With the knee in a flexed position, using the extramedullary cutting guide, we set our cut to take 9 mm off the high side correcting for varus and valgus and made this cut without difficulty.  We  then went to the femur and used the intramedullary guide through the femoral notch for the femur.  We set our distal femoral cutting guide for taking 8 mm and set this for 5 degrees externally rotated.  We made our distal femoral cut without problems and brought the knee back down to extension.  With 11 mm extension block, she is still just hyperextended, so we removed all pins.  We went back to the femur and then put our femoral sizing guide based off the epicondylar axis and chose a size 4 femur.  We put our 4-in-1 cutting block for 4 femur, made our anterior and posterior cuts followed  by our chamfer cuts, and then we made our femoral box cut.  We then went back to the tibia and set our tibial rotation off the femur and the tibial tubercle.  We trialed to size 4 tibia and we were pleased with that, so we made our keel punch as well as a drill hole for universal baseplate given her obesity.  We then placed the trial size 4 tibia and the 4 femur.  We went up to a size 16 mm polyethylene insert and this is where I felt that she had the best stability and well maintaining good range of motion.  We then made our patellar cut, taking 10 mm off the undersurface of the patella and drilled holes for size 32 patella to put this 10 mm back.  With all trial components in place, we were pleased with the range of motion and stability.  We then removed all trial components and irrigated the knee with normal saline solution using pulsatile lavage.  We then mixed our cement and cemented the real Stryker Triathlon universal baseplate size 4 followed by the real size 4 femur.  We cleaned cement debris from the knee and placed the constrained size 16 mm thickness polyethylene insert and cemented our patellar button.  Once the cement had hardened, we removed all debris from the knee.  We let the tourniquet down and hemostasis was obtained with electrocautery.  We irrigated the knee again with normal saline solution using pulsatile lavage.  Then we closed our arthrotomy with interrupted #1 Vicryl suture followed by 0 Vicryl in the deep tissue, 2-0 Vicryl in the subcutaneous tissue, 4-0 Monocryl subcuticular stitch, and Steri-Strips on the skin.  Well-padded sterile dressing was applied.  She was awakened, extubated, and taken to recovery room in stable condition.  All final counts were correct. There were no complications noted.  Of note, Richardean Canal, PA-C, assisted in the entire case.  His assistance was crucial for facilitating all aspects of this case.     Vanita Panda. Magnus Ivan,  M.D.     CYB/MEDQ  D:  05/17/2016  T:  05/17/2016  Job:  [id number]

## 2016-05-17 NOTE — Anesthesia Postprocedure Evaluation (Signed)
Anesthesia Post Note  Patient: [name]  Procedure(s) Performed: Procedure(s) (LRB): LEFT TOTAL KNEE ARTHROPLASTY (Left)  Patient location during evaluation: PACU Anesthesia Type: General and Regional Level of consciousness: awake and alert Pain management: pain level controlled Vital Signs Assessment: post-procedure vital signs reviewed and stable Respiratory status: spontaneous breathing, nonlabored ventilation, respiratory function stable and patient connected to nasal cannula oxygen Cardiovascular status: blood pressure returned to baseline and stable Postop Assessment: no signs of nausea or vomiting Anesthetic complications: no    Last Vitals:  Vitals:   05/17/16 1045 05/17/16 1107  BP: (!) 148/79 (!) 157/94  Pulse: 84 90  Resp: 12 14  Temp: 36.8 C 36.9 C    Last Pain:  Vitals:   05/17/16 1045  TempSrc:   PainSc: 0-No pain                 Kennieth Rad

## 2016-05-17 NOTE — Brief Op Note (Signed)
05/17/2016  9:09 AM  PATIENT:  [name]  75 y.o. female  PRE-OPERATIVE DIAGNOSIS:  Osteoarthritis left knee  POST-OPERATIVE DIAGNOSIS:  Osteoarthritis left knee  PROCEDURE:  Procedure(s) with comments: LEFT TOTAL KNEE ARTHROPLASTY (Left) - Adductor Block; Spinal to General  SURGEON:  Surgeon(s) and Role:    * Kathryne Hitch, MD - Primary  PHYSICIAN ASSISTANT: Rexene Edison, PA-C  ANESTHESIA:   regional and general  EBL:  Total I/O In: 1000 [I.V.:1000] Out: 600 [Urine:500; Blood:100]  COUNTS:  YES  TOURNIQUET:   Total Tourniquet Time Documented: Thigh (Left) - 45 minutes Total: Thigh (Left) - 45 minutes  PLAN OF CARE: Admit to inpatient   PATIENT DISPOSITION:  PACU - hemodynamically stable.   Delay start of Pharmacological VTE agent (>24hrs) due to surgical blood loss or risk of bleeding: no

## 2016-05-18 LAB — CBC
HEMATOCRIT: 35 % — AB (ref 36.0–46.0)
Hemoglobin: 11.4 g/dL — ABNORMAL LOW (ref 12.0–15.0)
MCH: 32.4 pg (ref 26.0–34.0)
MCHC: 32.6 g/dL (ref 30.0–36.0)
MCV: 99.4 fL (ref 78.0–100.0)
Platelets: 155 10*3/uL (ref 150–400)
RBC: 3.52 MIL/uL — ABNORMAL LOW (ref 3.87–5.11)
RDW: 13.4 % (ref 11.5–15.5)
WBC: 13.1 10*3/uL — ABNORMAL HIGH (ref 4.0–10.5)

## 2016-05-18 LAB — BASIC METABOLIC PANEL
Anion gap: 9 (ref 5–15)
BUN: 10 mg/dL (ref 6–20)
CALCIUM: 8.6 mg/dL — AB (ref 8.9–10.3)
CO2: 27 mmol/L (ref 22–32)
CREATININE: 0.7 mg/dL (ref 0.44–1.00)
Chloride: 104 mmol/L (ref 101–111)
GFR calc Af Amer: >60 mL/min (ref >60)
GFR calc non Af Amer: >60 mL/min (ref >60)
GLUCOSE: 131 mg/dL — AB (ref 65–99)
Potassium: 4 mmol/L (ref 3.5–5.1)
Sodium: 140 mmol/L (ref 135–145)

## 2016-05-18 MED ORDER — HYDROCORTISONE 1 % EX CREA
TOPICAL_CREAM | Freq: Three times a day (TID) | CUTANEOUS | Status: DC | PRN
  Administered 2016-05-18: 19:00:00 via TOPICAL
  Filled 2016-05-18: qty 28

## 2016-05-18 NOTE — Clinical Social Work Note (Signed)
Clinical Social Work Assessment  Patient Details  Name: [name] MRN: [id number] Date of Birth: April 22, 1941  Date of referral:  05/18/16               Reason for consult:  Facility Placement                Permission sought to share information with:  Facility Medical sales representative, Case Estate manager/land agent granted to share information::  Yes, Verbal Permission Granted  Name::        Agency::   [facility] SNFs with preference for [facility])  Relationship::     Contact Information:     Housing/Transportation Living arrangements for the past 2 months:  Single Family Home Source of Information:  Patient Patient Interpreter Needed:  None Criminal Activity/Legal Involvement Pertinent to Current Situation/Hospitalization:    Significant Relationships:  Adult Children Lives with:  Other (Comment) (granddaughter) Do you feel safe going back to the place where you live?  No Need for family participation in patient care:  No (Coment)  Care giving concerns:  No caregivers present at time of this assessment.   Social Worker assessment / plan:  CSW spoke with patient regarding discharge.  Patient states she is agreeable to STR at SNF and will likely have her son transport her.  Patient states she is interested in [facility] due to the close proximity to her home.  Patient states she is from home with her granddaughter and has multiple stairs at the front entrance of her home.  Employment status:  Retired Health and safety inspector:  Medicare PT Recommendations:  Skilled Nursing Facility Information / Referral to community resources:  Skilled Nursing Facility  Patient/Family's Response to care:  Patient is agreeable to SNF.  Patient/Family's Understanding of and Emotional Response to Diagnosis, Current Treatment, and Prognosis:  Patient exhibits moderate anxiety regarding transition to STR.  CSW offered support.  Emotional Assessment Appearance:  Appears stated  age Attitude/Demeanor/Rapport:    Affect (typically observed):  Adaptable, Accepting Orientation:  Oriented to Self, Oriented to Place, Oriented to  Time, Oriented to Situation Alcohol / Substance use:  Not Applicable Psych involvement (Current and /or in the community):  No (Comment)  Discharge Needs  Concerns to be addressed:  No discharge needs identified Readmission within the last 30 days:  No Current discharge risk:  None Barriers to Discharge:  Continued Medical Work up   [employer], LCSW 05/18/2016, 1:11 PM

## 2016-05-18 NOTE — NC FL2 (Signed)
[place] MEDICAID FL2 LEVEL OF CARE SCREENING TOOL     IDENTIFICATION  Patient Name: [name] Birthdate: 1941/04/27 Sex: female Admission Date (Current Location): 05/17/2016  [place]   Facility and Address:  [facility], [address], [place], [place] [address]      Provider Number: [id number]  Attending Physician Name and Address:  Kathryne Hitchhristopher Y Blackman, *  Relative Name and Phone Number:       Current Level of Care: Hospital Recommended Level of Care: Skilled Nursing Facility Prior Approval Number:    Date Approved/Denied:   PASRR Number: [id number]  Discharge Plan: SNF    Current Diagnoses: Patient Active Problem List   Diagnosis Date Noted  . Osteoarthritis of left knee 05/17/2016  . 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    Orientation RESPIRATION BLADDER Height & Weight     Self, Time, Situation, Place  O2 Continent Weight: 215 lb (97.5 kg) Height:  5\' 2"  (157.5 cm)  BEHAVIORAL SYMPTOMS/MOOD NEUROLOGICAL BOWEL NUTRITION STATUS      Continent Diet (regular)  AMBULATORY STATUS COMMUNICATION OF NEEDS Skin   Limited Assist Verbally Surgical wounds                       Personal Care Assistance Level of Assistance  Bathing, Dressing Bathing Assistance: Limited assistance   Dressing Assistance: Limited assistance     Functional Limitations Info             SPECIAL CARE FACTORS FREQUENCY  PT (By licensed PT), OT (By licensed OT)                    Contractures Contractures Info: Not present    Additional Factors Info      Allergies Info: Codeine           Current Medications (05/18/2016):  This is the current hospital active medication list Current Facility-Administered Medications  Medication Dose Route Frequency Provider Last Rate Last Dose  . 0.9 %  sodium chloride infusion   Intravenous Continuous Kathryne Hitchhristopher Y Blackman, MD 20 mL/hr at 05/18/16  0505    . acetaminophen (TYLENOL) tablet 650 mg  650 mg Oral Q6H PRN Kathryne Hitchhristopher Y Blackman, MD   650 mg at 05/17/16 1231   Or  . acetaminophen (TYLENOL) suppository 650 mg  650 mg Rectal Q6H PRN Kathryne Hitchhristopher Y Blackman, MD      . albuterol (PROVENTIL) (2.5 MG/3ML) 0.083% nebulizer solution 2.5 mg  2.5 mg Nebulization Q4H PRN Kathryne Hitchhristopher Y Blackman, MD      . alum & mag hydroxide-simeth (MAALOX/MYLANTA) 200-200-20 MG/5ML suspension 30 mL  30 mL Oral Q4H PRN Kathryne Hitchhristopher Y Blackman, MD      . ARIPiprazole (ABILIFY) tablet 5 mg  5 mg Oral Daily Kathryne Hitchhristopher Y Blackman, MD   5 mg at 05/18/16 0942  . atorvastatin (LIPITOR) tablet 20 mg  20 mg Oral Daily Kathryne Hitchhristopher Y Blackman, MD   20 mg at 05/18/16 0942  . busPIRone (BUSPAR) tablet 30 mg  30 mg Oral BID Kathryne Hitchhristopher Y Blackman, MD   30 mg at 05/17/16 2331  . diphenhydrAMINE (BENADRYL) 12.5 MG/5ML elixir 12.5-25 mg  12.5-25 mg Oral Q4H PRN Kathryne Hitchhristopher Y Blackman, MD      . docusate sodium (COLACE) capsule 100 mg  100 mg Oral BID Kathryne Hitchhristopher Y Blackman, MD   100 mg at 05/18/16 0942  . donepezil (ARICEPT) tablet 10 mg  10 mg  Oral QHS Kathryne Hitch, MD   10 mg at 05/17/16 2325  . fentaNYL (DURAGESIC - dosed mcg/hr) 75 mcg  75 mcg Transdermal Q72H Kathryne Hitch, MD   75 mcg at 05/17/16 1644  . furosemide (LASIX) tablet 40 mg  40 mg Oral Daily PRN Kathryne Hitch, MD      . HYDROmorphone (DILAUDID) injection 1-2 mg  1-2 mg Intravenous Q2H PRN Kathryne Hitch, MD   1 mg at 05/17/16 1415  . meclizine (ANTIVERT) tablet 25 mg  25 mg Oral TID PRN Kathryne Hitch, MD      . menthol-cetylpyridinium (CEPACOL) lozenge 3 mg  1 lozenge Oral PRN Kathryne Hitch, MD       Or  . phenol (CHLORASEPTIC) mouth spray 1 spray  1 spray Mouth/Throat PRN Kathryne Hitch, MD      . methocarbamol (ROBAXIN) tablet 500 mg  500 mg Oral Q6H PRN Kathryne Hitch, MD   500 mg at 05/18/16 0204   Or  . methocarbamol (ROBAXIN) 500 mg  in dextrose 5 % 50 mL IVPB  500 mg Intravenous Q6H PRN Kathryne Hitch, MD   500 mg at 05/17/16 0953  . metoCLOPramide (REGLAN) tablet 5-10 mg  5-10 mg Oral Q8H PRN Kathryne Hitch, MD       Or  . metoCLOPramide (REGLAN) injection 5-10 mg  5-10 mg Intravenous Q8H PRN Kathryne Hitch, MD      . multivitamin with minerals tablet 1 tablet  1 tablet Oral Daily Kathryne Hitch, MD   1 tablet at 05/18/16 [phone]  . ondansetron (ZOFRAN) tablet 4 mg  4 mg Oral Q6H PRN Kathryne Hitch, MD       Or  . ondansetron [facility]) injection 4 mg  4 mg Intravenous Q6H PRN Kathryne Hitch, MD      . oxyCODONE (Oxy IR/ROXICODONE) immediate release tablet 5-15 mg  5-15 mg Oral Q3H PRN Kathryne Hitch, MD   15 mg at 05/18/16 0829  . polyethylene glycol (MIRALAX / GLYCOLAX) packet 17 g  17 g Oral Daily PRN Kathryne Hitch, MD      . potassium chloride (K-DUR) CR tablet 10 mEq  10 mEq Oral BID PRN Kathryne Hitch, MD      . prednisoLONE acetate (PRED FORTE) 1 % ophthalmic suspension 1 drop  1 drop Both Eyes TID PRN Kathryne Hitch, MD      . rivaroxaban Carlena Hurl) tablet 10 mg  10 mg Oral Q breakfast Kathryne Hitch, MD   10 mg at 05/18/16 9604  . venlafaxine XR (EFFEXOR-XR) 24 hr capsule 75 mg  75 mg Oral Q breakfast Kathryne Hitch, MD   75 mg at 05/18/16 5409     Discharge Medications: Please see discharge summary for a list of discharge medications.  Relevant Imaging Results:  Relevant Lab Results:   Additional Information SSN: [id number]  Rondel Baton, LCSW

## 2016-05-18 NOTE — Evaluation (Signed)
Physical Therapy Evaluation Patient Details Name: Sarah StallingWillien D Kyer MRN: [id number] DOB: 07-01-1941 Today's Date: 05/18/2016   History of Present Illness  s/p L TKA; PMHx: HTN  Clinical Impression  Pt is s/p TKA resulting in the deficits listed below (see PT Problem List). * Pt will benefit from skilled PT to increase their independence and safety with mobility to allow discharge to the venue listed below. Pt will need SNF      Follow Up Recommendations SNF;Supervision/Assistance - 24 hour    Equipment Recommendations  None recommended by PT    Recommendations for Other Services       Precautions / Restrictions Precautions Precautions: Knee;Fall Required Braces or Orthoses: Knee Immobilizer - Left Restrictions Weight Bearing Restrictions: No Other Position/Activity Restrictions: WBAT      Mobility  Bed Mobility Overal bed mobility: Needs Assistance;+2 for physical assistance Bed Mobility: Supine to Sit     Supine to sit: Max assist;+2 for physical assistance     General bed mobility comments:  (NT at this time d/t pain--OOB with OT and RN, +2 per OT)  Transfers--per OT session/deferred at this time d/t pain and fatigue Overall transfer level: Needs assistance Equipment used: Rolling walker (2 wheeled) Transfers: Sit to/from [employer] CorporationStand;Stand Pivot Transfers Sit to Stand: Mod assist;+2 physical assistance;From elevated surface Stand pivot transfers: Mod assist;+2 physical assistance       General transfer comment: assist to rise and stabilize.  Assist to reposition walker.  Cues for sequence, standing tall and placement of UE's/LE's  Ambulation/Gait                Stairs            Wheelchair Mobility    Modified Rankin (Stroke Patients Only)       Balance        hx of falls                                     Pertinent Vitals/Pain Pain Assessment: 0-10 Pain Score: 7  Pain Location: L knee Pain Descriptors / Indicators:  Sore;Guarding;Grimacing Pain Intervention(s): Limited activity within patient's tolerance;Monitored during session;Premedicated before session    Home Living Family/patient expects to be discharged to:: Skilled nursing facility Living Arrangements: Other relatives             Home Equipment: Environmental consultantWalker - 2 wheels      Prior Function Level of Independence: Needs assistance         Comments: granddaughter lives with her (goes to work at 3); helps her into Oncologistshower     Hand Dominance        Extremity/Trunk Assessment   Upper Extremity Assessment: Defer to OT evaluation           Lower Extremity Assessment: RLE deficits/detail;LLE deficits/detail RLE Deficits / Details: generalized weakness LLE Deficits / Details: ankle limited to neutral df, strength 3/5; knee and hip grossly 2/5, weakness exacerbated by post op pain      Communication   Communication: No difficulties  Cognition Arousal/Alertness: Suspect due to medications;Lethargic Behavior During Therapy: Norwood Hlth CtrWFL for tasks assessed/performed;Flat affect   Area of Impairment: Following commands       Following Commands: Follows one step commands with increased time     Problem Solving: Slow processing;Requires verbal cues General Comments: extra time to process.  answered all questions appropriately    General Comments      Exercises  Total Joint Exercises Ankle Circles/Pumps: AROM;Both;AAROM;10 reps Quad Sets: 10 reps;AROM;Strengthening;Both;Limitations Quad Sets Limitations: pain and incr time Heel Slides: 10 reps;AAROM;Limitations;Left Heel Slides Limitations: requires frequent rest breaks Hip ABduction/ADduction: Strengthening;AAROM;10 reps;Limitations Hip Abduction/Adduction Limitations: requires breaks and limited by pain Straight Leg Raises: AAROM;Left;10 reps   Assessment/Plan    PT Assessment Patient needs continued PT services  PT Problem List Decreased strength;Decreased range of  motion;Decreased activity tolerance;Decreased mobility;Decreased knowledge of use of DME;Decreased knowledge of precautions;Pain;Obesity          PT Treatment Interventions DME instruction;Gait training;Functional mobility training;Therapeutic activities;Therapeutic exercise;Patient/family education    PT Goals (Current goals can be found in the Care Plan section)  Acute Rehab PT Goals Patient Stated Goal: return to independence PT Goal Formulation: With patient Time For Goal Achievement: 05/25/16 Potential to Achieve Goals: Fair    Frequency 7X/week   Barriers to discharge        Co-evaluation               End of Session Equipment Utilized During Treatment: Gait belt Activity Tolerance: Patient limited by fatigue;Patient limited by pain Patient left: in chair;with call bell/phone within reach;with chair alarm set           Time: 0940-7680 PT Time Calculation (min) (ACUTE ONLY): 21 min   Charges:   PT Evaluation $PT Eval Low Complexity: 1 Procedure     PT G Codes:        Malloree Raboin May 30, 2016, 11:12 AM

## 2016-05-18 NOTE — Progress Notes (Signed)
Physical Therapy Treatment Patient Details Name: Sarah StallingWillien D [name] MRN: [id number] DOB: Jan 21, 1941 Today's Date: 05/18/2016    History of Present Illness s/p L TKA; PMHx: HTN    PT Comments    slow progress, pt is limited by pain and lethargy as well as being globally deconditioned; continue to recommend SNF post acute  Follow Up Recommendations  SNF;Supervision/Assistance - 24 hour     Equipment Recommendations  None recommended by PT    Recommendations for Other Services       Precautions / Restrictions Precautions Precautions: Knee;Fall Required Braces or Orthoses: Knee Immobilizer - Left Restrictions Other Position/Activity Restrictions: WBAT    Mobility  Bed Mobility Overal bed mobility: Needs Assistance;+2 for physical assistance Bed Mobility: Sit to Supine       Sit to supine: Max assist;+2 for safety/equipment   General bed mobility comments: assist with bil LEs and trunk, bed pad used to assist with positioing in bed, pt with limited effort  Transfers Overall transfer level: Needs assistance Equipment used: Rolling walker (2 wheeled) Transfers: Sit to/from UGI CorporationStand;Stand Pivot Transfers Sit to Stand: Mod assist;+2 physical assistance Stand pivot transfers: Mod assist;+2 physical assistance       General transfer comment: assist to rise and stabilize.  Assist to reposition walker.  Cues for sequence, standing tall and placement of UE's/LE's  Ambulation/Gait             General Gait Details: unable   Stairs            Wheelchair Mobility    Modified Rankin (Stroke Patients Only)       Balance Overall balance assessment: History of Falls           Standing balance-Leahy Scale: Zero                      Cognition Arousal/Alertness: Suspect due to medications;Lethargic Behavior During Therapy: [facility] HospitalWFL for tasks assessed/performed;Flat affect   Area of Impairment: Following commands       Following Commands: Follows one  step commands with increased time     Problem Solving: Slow processing;Requires verbal cues General Comments: extra time to process and perform tasks    Exercises      General Comments        Pertinent Vitals/Pain Pain Score: 8  Pain Location: L knee Pain Descriptors / Indicators: Grimacing;Guarding;Sore Pain Intervention(s): Limited activity within patient's tolerance;Monitored during session;Premedicated before session;Repositioned    Home Living                      Prior Function            PT Goals (current goals can now be found in the care plan section) Acute Rehab PT Goals Patient Stated Goal: return to independence PT Goal Formulation: With patient Time For Goal Achievement: 05/25/16 Potential to Achieve Goals: Fair Progress towards PT goals: Not progressing toward goals - comment (weakness and lethargy)    Frequency    7X/week      PT Plan Current plan remains appropriate    Co-evaluation             End of Session Equipment Utilized During Treatment: Gait belt;Left knee immobilizer Activity Tolerance: Patient limited by fatigue;Patient limited by pain Patient left: in bed;with call bell/phone within reach;with bed alarm set     Time: [id number] PT Time Calculation (min) (ACUTE ONLY): 24 min  Charges:  $Therapeutic Activity: 23-37 mins  G CodesDrucilla Chalet 05/18/2016, 4:22 PM

## 2016-05-18 NOTE — Clinical Social Work Placement (Addendum)
   CLINICAL SOCIAL WORK PLACEMENT  NOTE  Date:  05/18/2016  Patient Details  Name: [name] MRN: [id number] Date of Birth: [birth year]  Clinical Social Work is seeking post-discharge placement for this patient at the Skilled  Nursing Facility level of care (*CSW will initial, date and re-position this form in  chart as items are completed):  Yes   Patient/family provided with [facility] Clinical Social Work Department's list of facilities offering this level of care within the geographic area requested by the patient (or if unable, by the patient's family).  Yes   Patient/family informed of their freedom to choose among providers that offer the needed level of care, that participate in Medicare, Medicaid or managed care program needed by the patient, have an available bed and are willing to accept the patient.  Yes   Patient/family informed of [facility]'s ownership interest in [facility] and [facility], as well as of the fact that they are under no obligation to receive care at these facilities.  PASRR submitted to EDS on 05/18/16     PASRR number received on 05/18/16     Existing PASRR number confirmed on       FL2 transmitted to all facilities in geographic area requested by pt/family on 05/18/16     FL2 transmitted to all facilities within larger geographic area on       Patient informed that his/her managed care company has contracts with or will negotiate with certain facilities, including the following:        Yes   Patient/family informed of bed offers received.  Patient chooses bed at [facility]     Physician recommends and patient chooses bed at     SNF Patient to be transferred to [facility] on  . 05/20/2016   Patient to be transferred to facility by     [facility]  Patient family notified on   of transfer. Son:  Name of family member notified:      [name]  PHYSICIAN Please prepare priority discharge summary, including  medications     Additional Comment:    _______________________________________________ Rondel Baton, LCSW 05/18/2016, 1:23 PM

## 2016-05-18 NOTE — Evaluation (Signed)
Occupational Therapy Evaluation Patient Details Name: [name] MRN: [id number] DOB: July 18, 1941 Today's Date: 05/18/2016    History of Present Illness s/p L TKA   Clinical Impression   This 75 year old female was admitted for the above surgery. She will benefit from continued OT in acute setting as well as SNF.  She currently needs +2 assistance for adls/mobility.  Goals in acute are for min A for mobility related to adls as well as transfers to 3:1 commode    Follow Up Recommendations  SNF    Equipment Recommendations  None recommended by OT    Recommendations for Other Services       Precautions / Restrictions Precautions Precautions: Knee;Fall Required Braces or Orthoses: Knee Immobilizer - Left Restrictions Weight Bearing Restrictions: No      Mobility Bed Mobility Overal bed mobility: Needs Assistance;+2 for physical assistance Bed Mobility: Supine to Sit     Supine to sit: Max assist;+2 for physical assistance     General bed mobility comments: pt used rail and able to sit up.  Had difficulty sliding both legs over edge of bed.  utilized bed pad to assist with scooting.  Pt did a little of this, but fatiqued quickly  Transfers Overall transfer level: Needs assistance Equipment used: Rolling walker (2 wheeled) Transfers: Sit to/from [employer] CorporationStand;Stand Pivot Transfers Sit to Stand: Mod assist;+2 physical assistance;From elevated surface Stand pivot transfers: Mod assist;+2 physical assistance       General transfer comment: assist to rise and stabilize.  Assist to reposition walker.  Cues for sequence, standing tall and placement of UE's/LE's    Balance                                            ADL Overall ADL's : Needs assistance/impaired     Grooming: Set up;Sitting   Upper Body Bathing: Set up;Sitting   Lower Body Bathing: Maximal assistance;+2 for physical assistance   Upper Body Dressing : Minimal assistance;Sitting    Lower Body Dressing: Total assistance;+2 for physical assistance;Sit to/from stand   Toilet Transfer: Moderate assistance;+2 for physical assistance;RW (to chair from elevated bed)             General ADL Comments: performed SPT to chair.  Cues to stand upright; assist with positioning RW.  Increased pain with weight bearing and moving feet     Vision     Perception     Praxis      Pertinent Vitals/Pain Pain Assessment: 0-10 Pain Score: 7  Pain Location: left knee Pain Descriptors / Indicators: Aching Pain Intervention(s): Limited activity within patient's tolerance;Monitored during session;Premedicated before session;Repositioned;Ice applied     Hand Dominance     Extremity/Trunk Assessment Upper Extremity Assessment Upper Extremity Assessment: Overall WFL for tasks assessed (grossly 4/5)           Communication Communication Communication: No difficulties   Cognition Arousal/Alertness: Lethargic;Suspect due to medications (extra time to move) Behavior [facility] tasks assessed/performed   Area of Impairment: Problem solving             Problem Solving: Slow processing;Requires verbal cues General Comments: extra time to process.  answered all questions appropriately   General Comments       Exercises       Shoulder Instructions      Home Living Family/patient expects to be  discharged to:: Private residence Living Arrangements: Other relatives                 Bathroom Shower/Tub: Producer, television/film/video: Standard     Home Equipment: Bedside commode          Prior Functioning/Environment Level of Independence: Needs assistance        Comments: granddaughter lives with her (goes to work at 3); helps her into shower        OT Problem List: Decreased strength;Decreased activity tolerance;Impaired balance (sitting and/or standing);Pain;Decreased knowledge of use of DME or AE;Decreased knowledge of  precautions;Cardiopulmonary status limiting activity   OT Treatment/Interventions: Self-care/ADL training;DME and/or AE instruction;Therapeutic activities;Patient/family education    OT Goals(Current goals can be found in the care plan section) Acute Rehab OT Goals Patient Stated Goal: return to independence OT Goal Formulation: With patient Time For Goal Achievement: 05/25/16 Potential to Achieve Goals: Good ADL Goals Pt Will Transfer to Toilet: with min assist;stand pivot transfer;bedside commode Additional ADL Goal #1: pt will perform bed mobility with mod A from hospital bed in preparation for adls Additional ADL Goal #2: pt will go from sit to stand with min A and maintain with min guard for 2 minutes during adls  OT Frequency: Min 2X/week   Barriers to D/C:            Co-evaluation              End of Session CPM Left Knee CPM Left Knee: Off Nurse Communication:  (RN assisted during eval)  Activity Tolerance: Patient limited by fatigue;Patient limited by pain Patient left: in chair;with call bell/phone within reach;with chair alarm set   Time: 0935-1007 OT Time Calculation (min): 32 min Charges:  OT General Charges $OT Visit: 1 Procedure OT Evaluation $OT Eval Low Complexity: 1 Procedure OT Treatments $Therapeutic Activity: 8-22 mins G-Codes:    [name] 06/13/16, 10:42 AM  Marica Otter, OTR/L [phone] June 13, 2016

## 2016-05-18 NOTE — Progress Notes (Signed)
Mild redness and swelling noted to cheek areas where o2 tubing sitting on fact. Swelling mild-moderate on left cheek. Pt denies pain or itching at site. Denies SOB or chest pain. O2 tubing removed and wrapped in gauze to prevent tubing from lying directly on skin. On Call MD paged

## 2016-05-18 NOTE — Discharge Instructions (Addendum)
Information on my medicine - XARELTO® (Rivaroxaban) ° °This medication education was reviewed with me or my healthcare representative as part of my discharge preparation.  The pharmacist that spoke with me during my hospital stay was:  Jackson, Rachel E, RPH ° °Why was Xarelto® prescribed for you? °Xarelto® was prescribed for you to reduce the risk of blood clots forming after orthopedic surgery. The medical term for these abnormal blood clots is venous thromboembolism (VTE). ° °What do you need to know about xarelto® ? °Take your Xarelto® ONCE DAILY at the same time every day. °You may take it either with or without food. ° °If you have difficulty swallowing the tablet whole, you may crush it and mix in applesauce just prior to taking your dose. ° ° °INSTRUCTIONS AFTER JOINT REPLACEMENT  ° °o Remove items at home which could result in a fall. This includes throw rugs or furniture in walking pathways °o ICE to the affected joint every three hours while awake for 30 minutes at a time, for at least the first 3-5 days, and then as needed for pain and swelling.  Continue to use ice for pain and swelling. You may notice swelling that will progress down to the foot and ankle.  This is normal after surgery.  Elevate your leg when you are not up walking on it.   °o Continue to use the breathing machine you got in the hospital (incentive spirometer) which will help keep your temperature down.  It is common for your temperature to cycle up and down following surgery, especially at night when you are not up moving around and exerting yourself.  The breathing machine keeps your lungs expanded and your temperature down. ° ° °DIET:  As you were doing prior to hospitalization, we recommend a well-balanced diet. ° °DRESSING / WOUND CARE / SHOWERING ° °Keep the surgical dressing until follow up.  The dressing is water proof, so you can shower without any extra covering.  IF THE DRESSING FALLS OFF or the wound gets wet inside, change  the dressing with sterile gauze.  Please use good hand washing techniques before changing the dressing.  Do not use any lotions or creams on the incision until instructed by your surgeon.   ° °ACTIVITY ° °o Increase activity slowly as tolerated, but follow the weight bearing instructions below.   °o No driving for 6 weeks or until further direction given by your physician.  You cannot drive while taking narcotics.  °o No lifting or carrying greater than 10 lbs. until further directed by your surgeon. °o Avoid periods of inactivity such as sitting longer than an hour when not asleep. This helps prevent blood clots.  °o You may return to work once you are authorized by your doctor.  ° ° ° °WEIGHT BEARING  ° °Weight bearing as tolerated with assist device (walker, cane, etc) as directed, use it as long as suggested by your surgeon or therapist, typically at least 4-6 weeks. ° ° °EXERCISES ° °Results after joint replacement surgery are often greatly improved when you follow the exercise, range of motion and muscle strengthening exercises prescribed by your doctor. Safety measures are also important to protect the joint from further injury. Any time any of these exercises cause you to have increased pain or swelling, decrease what you are doing until you are comfortable again and then slowly increase them. If you have problems or questions, call your caregiver or physical therapist for advice.  ° °Rehabilitation is important following   a joint replacement. After just a few days of immobilization, the muscles of the leg can become weakened and shrink (atrophy).  These exercises are designed to build up the tone and strength of the thigh and leg muscles and to improve motion. Often times heat used for twenty to thirty minutes before working out will loosen up your tissues and help with improving the range of motion but do not use heat for the first two weeks following surgery (sometimes heat can increase post-operative  swelling).  ° °These exercises can be done on a training (exercise) mat, on the floor, on a table or on a bed. Use whatever works the best and is most comfortable for you.    Use music or television while you are exercising so that the exercises are a pleasant break in your day. This will make your life better with the exercises acting as a break in your routine that you can look forward to.   Perform all exercises about fifteen times, three times per day or as directed.  You should exercise both the operative leg and the other leg as well. ° °Exercises include: °  °• Quad Sets - Tighten up the muscle on the front of the thigh (Quad) and hold for 5-10 seconds.   °• Straight Leg Raises - With your knee straight (if you were given a brace, keep it on), lift the leg to 60 degrees, hold for 3 seconds, and slowly lower the leg.  Perform this exercise against resistance later as your leg gets stronger.  °• Leg Slides: Lying on your back, slowly slide your foot toward your buttocks, bending your knee up off the floor (only go as far as is comfortable). Then slowly slide your foot back down until your leg is flat on the floor again.  °• Angel Wings: Lying on your back spread your legs to the side as far apart as you can without causing discomfort.  °• Hamstring Strength:  Lying on your back, push your heel against the floor with your leg straight by tightening up the muscles of your buttocks.  Repeat, but this time bend your knee to a comfortable angle, and push your heel against the floor.  You may put a pillow under the heel to make it more comfortable if necessary.  ° °A rehabilitation program following joint replacement surgery can speed recovery and prevent re-injury in the future due to weakened muscles. Contact your doctor or a physical therapist for more information on knee rehabilitation.  ° ° °CONSTIPATION ° °Constipation is defined medically as fewer than three stools per week and severe constipation as less than  one stool per week.  Even if you have a regular bowel pattern at home, your normal regimen is likely to be disrupted due to multiple reasons following surgery.  Combination of anesthesia, postoperative narcotics, change in appetite and fluid intake all can affect your bowels.  ° °YOU MUST use at least one of the following options; they are listed in order of increasing strength to get the job done.  They are all available over the counter, and you may need to use some, POSSIBLY even all of these options:   ° °Drink plenty of fluids (prune juice may be helpful) and high fiber foods °Colace 100 mg by mouth twice a day  °Senokot for constipation as directed and as needed Dulcolax (bisacodyl), take with full glass of water  °Miralax (polyethylene glycol) once or twice a day as needed. ° °If you   have tried all these things and are unable to have a bowel movement in the first 3-4 days after surgery call either your surgeon or your primary doctor.   ° °If you experience loose stools or diarrhea, hold the medications until you stool forms back up.  If your symptoms do not get better within 1 week or if they get worse, check with your doctor.  If you experience "the worst abdominal pain ever" or develop nausea or vomiting, please contact the office immediately for further recommendations for treatment. ° ° °ITCHING:  If you experience itching with your medications, try taking only a single pain pill, or even half a pain pill at a time.  You can also use Benadryl over the counter for itching or also to help with sleep.  ° °TED HOSE STOCKINGS:  Use stockings on both legs until for at least 2 weeks or as directed by physician office. They may be removed at night for sleeping. ° °MEDICATIONS:  See your medication summary on the “After Visit Summary” that nursing will review with you.  You may have some home medications which will be placed on hold until you complete the course of blood thinner medication.  It is important for  you to complete the blood thinner medication as prescribed. ° °PRECAUTIONS:  If you experience chest pain or shortness of breath - call 911 immediately for transfer to the hospital emergency department.  ° °If you develop a fever greater that 101 F, purulent drainage from wound, increased redness or drainage from wound, foul odor from the wound/dressing, or calf pain - CONTACT YOUR SURGEON.   °                                                °FOLLOW-UP APPOINTMENTS:  If you do not already have a post-op appointment, please call the office for an appointment to be seen by your surgeon.  Guidelines for how soon to be seen are listed in your “After Visit Summary”, but are typically between 1-4 weeks after surgery. ° °OTHER INSTRUCTIONS:  ° °Knee Replacement:  Do not place pillow under knee, focus on keeping the knee straight while resting. CPM instructions: 0-90 degrees, 2 hours in the morning, 2 hours in the afternoon, and 2 hours in the evening. Place foam block, curve side up under heel at all times except when in CPM or when walking.  DO NOT modify, tear, cut, or change the foam block in any way. ° °MAKE SURE YOU:  °• Understand these instructions.  °• Get help right away if you are not doing well or get worse.  ° ° °Thank you for letting us be a part of your medical care team.  It is a privilege we respect greatly.  We hope these instructions will help you stay on track for a fast and full recovery!  ° °Take Xarelto® exactly as prescribed by your doctor and DO NOT stop taking Xarelto® without talking to the doctor who prescribed the medication.  Stopping without other VTE prevention medication to take the place of Xarelto® may increase your risk of developing a clot. ° °After discharge, you should have regular check-up appointments with your healthcare provider that is prescribing your Xarelto®.   ° °What do you do if you miss a dose? °If you miss a dose, take it as soon as   you remember on the same day then  continue your regularly scheduled once daily regimen the next day. Do not take two doses of Xarelto® on the same day.  ° °Important Safety Information °A possible side effect of Xarelto® is bleeding. You should call your healthcare provider right away if you experience any of the following: °? Bleeding from an injury or your nose that does not stop. °? Unusual colored urine (red or dark brown) or unusual colored stools (red or black). °? Unusual bruising for unknown reasons. °? A serious fall or if you hit your head (even if there is no bleeding). ° °Some medicines may interact with Xarelto® and might increase your risk of bleeding while on Xarelto®. To help avoid this, consult your healthcare provider or pharmacist prior to using any new prescription or non-prescription medications, including herbals, vitamins, non-steroidal anti-inflammatory drugs (NSAIDs) and supplements. ° °This website has more information on Xarelto®: www.xarelto.com. ° ° °

## 2016-05-18 NOTE — Progress Notes (Signed)
Subjective: 1 Day Post-Op Procedure(s) (LRB): LEFT TOTAL KNEE ARTHROPLASTY (Left) Patient reports pain as severe.  Is in chronic pain management.  H/H stable.  Has been up slightly with therapy.  Objective: Vital signs in last 24 hours: Temp:  [98 F (36.7 C)-98.9 F (37.2 C)] 98.6 F (37 C) (10/07 1026) Pulse Rate:  [81-104] 98 (10/07 1026) Resp:  [13-17] 17 (10/07 1026) BP: (140-178)/(72-107) 151/89 (10/07 1026) SpO2:  [92 %-96 %] 94 % (10/07 1026) Weight:  [97.5 kg (215 lb)] 97.5 kg (215 lb) (10/06 1107)  Intake/Output from previous day: 10/06 0701 - 10/07 0700 In: 3043.8 [P.O.:840; I.V.:2203.8] Out: 2175 [Urine:2075; Blood:100] Intake/Output this shift: Total I/O In: 120 [P.O.:120] Out: -    Recent Labs  05/18/16 0435  HGB 11.4*    Recent Labs  05/18/16 0435  WBC 13.1*  RBC 3.52*  HCT 35.0*  PLT 155    Recent Labs  05/18/16 0435  NA 140  K 4.0  CL 104  CO2 27  BUN 10  CREATININE 0.70  GLUCOSE 131*  CALCIUM 8.6*   No results for input(s): LABPT, INR in the last 72 hours.  Sensation intact distally Intact pulses distally Dorsiflexion/Plantar flexion intact Incision: scant drainage No cellulitis present Compartment soft  Assessment/Plan: 1 Day Post-Op Procedure(s) (LRB): LEFT TOTAL KNEE ARTHROPLASTY (Left) Up with therapy Discharge to SNF most likely.  Kathryne Hitch 05/18/2016, 10:59 AM

## 2016-05-19 MED ORDER — METHOCARBAMOL 500 MG PO TABS: 500.0000 mg | ORAL_TABLET | Freq: Four times a day (QID) | ORAL | 0 refills | Status: DC | PRN

## 2016-05-19 MED ORDER — OXYCODONE HCL 15 MG PO TABS: 15.0000 mg | ORAL_TABLET | ORAL | 0 refills | Status: DC | PRN

## 2016-05-19 MED ORDER — RIVAROXABAN 10 MG PO TABS: 10.0000 mg | ORAL_TABLET | Freq: Every day | ORAL | 0 refills | Status: DC

## 2016-05-19 NOTE — Progress Notes (Signed)
CSW assisting with d/c planning.  CSW confirmed with [facility] that facility has bed available for pt when medically stable for d/c.   CSW notified pt of confirmation of bed at [facility].  Per MD note, pt not medically ready for discharge today. Anticipate d/c on Monday 10/9.  Weekday CSW to continue to follow and assist with pt discharge planning needs.   Loletta Specter, MSW, LCSW Clinical Social Worker Weekend coverage [phone]

## 2016-05-19 NOTE — Progress Notes (Signed)
Subjective: 2 Days Post-Op Procedure(s) (LRB): LEFT TOTAL KNEE ARTHROPLASTY (Left) Patient reports pain as moderate.  States that she feel a little better today.  Objective: Vital signs in last 24 hours: Temp:  [98.3 F (36.8 C)-98.7 F (37.1 C)] 98.7 F (37.1 C) (10/08 0616) Pulse Rate:  [80-104] 80 (10/08 0616) Resp:  [16-18] 16 (10/08 0616) BP: (151-179)/(69-89) 174/69 (10/08 0616) SpO2:  [92 %-100 %] 98 % (10/08 0616)  Intake/Output from previous day: 10/07 0701 - 10/08 0700 In: 360 [P.O.:360] Out: 850 [Urine:850] Intake/Output this shift: Total I/O In: 0  Out: 250 [Urine:250]   Recent Labs  05/18/16 0435  HGB 11.4*    Recent Labs  05/18/16 0435  WBC 13.1*  RBC 3.52*  HCT 35.0*  PLT 155    Recent Labs  05/18/16 0435  NA 140  K 4.0  CL 104  CO2 27  BUN 10  CREATININE 0.70  GLUCOSE 131*  CALCIUM 8.6*   No results for input(s): LABPT, INR in the last 72 hours.  Sensation intact distally Intact pulses distally Dorsiflexion/Plantar flexion intact Incision: dressing C/D/I No cellulitis present Compartment soft  Assessment/Plan: 2 Days Post-Op Procedure(s) (LRB): LEFT TOTAL KNEE ARTHROPLASTY (Left) Up with therapy Plan for discharge tomorrow Discharge to SNF  Kathryne Hitch 05/19/2016, 9:34 AM

## 2016-05-19 NOTE — Progress Notes (Signed)
Discussed by phone with Dr Lajoyce Corners re pt's very slow progress (per PT pt actually doing less well today & even unsafe for [facility] use). He acknowledged info & approved leaving foley in for now. Truxton Stupka, [employer]

## 2016-05-19 NOTE — Progress Notes (Signed)
[name] & POA, Malachi Pro, expressed his concern that he had not been informed re plan to discharge pt to [facility]. He stated he needs to be involved in any decisions re rehab. Shlok Raz, [employer]

## 2016-05-19 NOTE — Progress Notes (Addendum)
Physical Therapy Treatment Patient Details Name: [name] MRN: [id number] DOB: Nov 02, 1940 Today's Date: 05/19/2016    History of Present Illness s/p L TKA; PMHx: HTN    PT Comments    Pt requiring incr assist today; she is increasingly lethargic, difficulty following commands although she is oriented x3; pt with LOB to Right in sitting, some lagging LUE with bil shoulder flexion although pt c/o L shoulder soreness; no significant facial asymmetry; sats >95% on 2L [place] O2; HR 121 upon return to bed, HR Quickly decr to 90s (RN made aware)  Follow Up Recommendations  SNF;Supervision/Assistance - 24 hour     Equipment Recommendations  None recommended by PT    Recommendations for Other Services       Precautions / Restrictions Precautions Precautions: Knee;Fall Required Braces or Orthoses: Knee Immobilizer - Left Restrictions Weight Bearing Restrictions: No Other Position/Activity Restrictions: WBAT    Mobility  Bed Mobility Overal bed mobility: Needs Assistance;+2 for physical assistance Bed Mobility: Supine to Sit;Sit to Supine     Supine to sit: Total assist Sit to supine: Total assist;+2 for physical assistance;+2 for safety/equipment   General bed mobility comments: assist with bil LEs and trunk, bed pad used to assist with positioing in bed, pt with limited effort  Transfers                    Ambulation/Gait                 Stairs            Wheelchair Mobility    Modified Rankin (Stroke Patients Only)       Balance Overall balance assessment: Needs assistance   Sitting balance-Leahy Scale: Poor Sitting balance - Comments: LOB to R in sitting, able to correct with multi-modal cues  Postural control: Right lateral lean                          Cognition Arousal/Alertness: Suspect due to medications;Lethargic Behavior During Therapy: Flat affect Overall Cognitive Status: Impaired/Different from baseline Area of  Impairment: Following commands;Problem solving       Following Commands: Follows one step commands inconsistently     Problem Solving: Slow processing;Requires verbal cues;Decreased initiation;Requires tactile cues General Comments: pt requiring extra time, very limited effort; oriented x3    Exercises Total Joint Exercises Ankle Circles/Pumps: PROM;AAROM;Both;10 reps;Limitations Ankle Circles/Pumps Limitations: too lethargic    General Comments        Pertinent Vitals/Pain Pain Assessment: Faces Faces Pain Scale: Hurts even more Pain Location: L knee, L shoulder Pain Descriptors / Indicators: Grimacing;Crying Pain Intervention(s): Limited activity within patient's tolerance    Home Living                      Prior Function            PT Goals (current goals can now be found in the care plan section) Acute Rehab PT Goals Patient Stated Goal: return to independence PT Goal Formulation: With patient Time For Goal Achievement: 05/25/16 Potential to Achieve Goals: Fair Progress towards PT goals: Not progressing toward goals - comment    Frequency    7X/week      PT Plan Current plan remains appropriate    Co-evaluation             End of Session Equipment Utilized During Treatment: Left knee immobilizer Activity Tolerance: Patient limited by fatigue;Patient limited  by lethargy;Patient limited by pain Patient left: in bed;with call bell/phone within reach;with bed alarm set     Time: [id number] PT Time Calculation (min) (ACUTE ONLY): 25 min  Charges:  $Therapeutic Activity: 23-37 mins                    G Codes:      Piotr Christopher 05/19/2016, 11:26 AM

## 2016-05-20 NOTE — Progress Notes (Signed)
Patient ID: [name], female   DOB: [birth year], 75 y.o.   MRN: [id number] No acute changes.  Knee stable.  Vitals stable.  Can be discharged to skilled nursing today.

## 2016-05-20 NOTE — Discharge Summary (Signed)
Patient ID: [name] MRN: [id number] DOB/AGE: [birth year] 75 y.o.  Admit date: 05/17/2016 Discharge date: 05/20/2016  Admission Diagnoses:  Principal Problem:   Osteoarthritis of left knee Active Problems:   Status post total left knee replacement   Discharge Diagnoses:  Same  Past Medical History:  Diagnosis Date  . Arthritis   . Asthma   . Chronic back pain   . Depression   . Hyperlipemia   . Hypertension   . Scoliosis   . Sleep apnea    cpap - settingsi at 3     Surgeries: Procedure(s): LEFT TOTAL KNEE ARTHROPLASTY on 05/17/2016   Consultants:   Discharged Condition: Improved  Hospital Course: ARLYLE STRATE is an 75 y.o. female who was admitted 05/17/2016 for operative treatment ofOsteoarthritis of left knee. Patient has severe unremitting pain that affects sleep, daily activities, and work/hobbies. After pre-op clearance the patient was taken to the operating room on 05/17/2016 and underwent  Procedure(s): LEFT TOTAL KNEE ARTHROPLASTY.    Patient was given perioperative antibiotics: Anti-infectives    Start     Dose/Rate Route Frequency Ordered Stop   05/17/16 1400  ceFAZolin (ANCEF) IVPB 1 g/50 mL premix     1 g 100 mL/hr over 30 Minutes Intravenous Every 6 hours 05/17/16 1112 05/17/16 2033   05/17/16 0602  ceFAZolin (ANCEF) IVPB 2g/100 mL premix     2 g 200 mL/hr over 30 Minutes Intravenous On call to O.R. 05/17/16 0602 05/17/16 0750       Patient was given sequential compression devices, early ambulation, and chemoprophylaxis to prevent DVT.  Patient benefited maximally from hospital stay and there were no complications.    Recent vital signs: Patient Vitals for the past 24 hrs:  BP Temp Temp src Pulse Resp SpO2  05/20/16 0606 (!) 147/64 98.7 F (37.1 C) Oral 89 16 98 %  05/19/16 1931 (!) 148/53 98.6 F (37 C) Oral 94 16 100 %  05/19/16 1341 (!) 141/65 97.6 F (36.4 C) Oral 91 18 99 %     Recent laboratory studies:  Recent Labs  05/18/16 0435  WBC 13.1*  HGB 11.4*  HCT 35.0*  PLT 155  NA 140  K 4.0  CL 104  CO2 27  BUN 10  CREATININE 0.70  GLUCOSE 131*  CALCIUM 8.6*     Discharge Medications:     Medication List    STOP taking these medications   aspirin EC 325 MG tablet     TAKE these medications   alendronate 70 MG tablet Commonly known as:  FOSAMAX Take 70 mg by mouth every Sunday. Take with a full glass of water on an empty stomach.   ARIPiprazole 5 MG tablet Commonly known as:  ABILIFY Take 5 mg by mouth daily.   atorvastatin 20 MG tablet Commonly known as:  LIPITOR Take 20 mg by mouth daily.   busPIRone 30 MG tablet Commonly known as:  BUSPAR Take 30 mg by mouth 2 (two) times daily.   desvenlafaxine 50 MG 24 hr tablet Commonly known as:  PRISTIQ Take 50 mg by mouth daily.   donepezil 10 MG tablet Commonly known as:  ARICEPT Take 10 mg by mouth at bedtime.   fentaNYL 75 MCG/HR Commonly known as:  DURAGESIC - dosed mcg/hr Place 75 mcg onto the skin every 3 (three) days.   fexofenadine 180 MG tablet Commonly known as:  ALLEGRA Take 180 mg by mouth daily as needed for allergies.   furosemide 40 MG tablet  Commonly known as:  LASIX Take 40 mg by mouth daily as needed for fluid.   KLOR-CON 10 10 MEQ tablet Generic drug:  potassium chloride Take 10 mEq by mouth 2 (two) times daily as needed (for swelling).   lisinopril 40 MG tablet Commonly known as:  PRINIVIL,ZESTRIL Take 40 mg by mouth daily.   meclizine 25 MG tablet Commonly known as:  ANTIVERT Take 25 mg by mouth 3 (three) times daily as needed for dizziness.   methocarbamol 500 MG tablet Commonly known as:  ROBAXIN Take 1 tablet (500 mg total) by mouth every 6 (six) hours as needed for muscle spasms.   multivitamin with minerals Tabs tablet Take 1 tablet by mouth daily.   oxyCODONE 15 MG immediate release tablet Commonly known as:  ROXICODONE Take 1 tablet (15 mg total) by mouth every 3 (three) hours as  needed for pain.   prednisoLONE acetate 1 % ophthalmic suspension Commonly known as:  PRED FORTE Place 1 drop into both eyes 3 (three) times daily as needed (for dry eyes).   PROAIR HFA 108 (90 Base) MCG/ACT inhaler Generic drug:  albuterol Inhale 2 puffs into the lungs every 4 (four) hours as needed for wheezing or shortness of breath.   rivaroxaban 10 MG Tabs tablet Commonly known as:  XARELTO Take 1 tablet (10 mg total) by mouth daily with breakfast.       Diagnostic Studies: Dg Knee Left Port  Result Date: 05/17/2016 CLINICAL DATA:  Status post left total knee arthroplasty. EXAM: PORTABLE LEFT KNEE - 1-2 VIEW COMPARISON:  04/10/2016 FINDINGS: Interval left knee total arthroplasty. The hardware components are in anatomic alignment. No complicating features identified. Gas is identified within the surrounding soft tissues. IMPRESSION: 1. No complications after left total knee arthroplasty. Electronically Signed   By: Signa Kellaylor  Stroud M.D.   On: 05/17/2016 10:20    Disposition: to skilled nursing facility  Discharge Instructions    Call MD / Call 911    Complete by:  As directed    If you experience chest pain or shortness of breath, CALL 911 and be transported to the hospital emergency room.  If you develope a fever above 101 F, pus (white drainage) or increased drainage or redness at the wound, or calf pain, call your surgeon's office.   Constipation Prevention    Complete by:  As directed    Drink plenty of fluids.  Prune juice may be helpful.  You may use a stool softener, such as Colace (over the counter) 100 mg twice a day.  Use MiraLax (over the counter) for constipation as needed.   Diet - low sodium heart healthy    Complete by:  As directed    Discharge patient    Complete by:  As directed    Increase activity slowly as tolerated    Complete by:  As directed       Follow-up Information    Kathryne Hitchhristopher Y Mekenna Finau, MD Follow up in 2 week(s).   Specialty:  Orthopedic  Surgery Contact information: [address] [place] [address] [phone]            Signed: Kathryne HitchChristopher Y Shailyn Weyandt 05/20/2016, 10:46 AM

## 2016-05-20 NOTE — Care Management Note (Signed)
Case Management Note  Patient Details  Name: [name] MRN: [id number] Date of Birth: June 06, 1941  Subjective/Objective: 75 y/o f admitted w/OA L knee. PT-recc SNF. CSW already following for SNF. No CM needs.                   Action/Plan:d/c SNF.   Expected Discharge Date:                  Expected Discharge Plan:  Skilled Nursing Facility  In-House Referral:  Clinical Social Work  Discharge planning Services  CM Consult  Post Acute Care Choice:    Choice offered to:     DME Arranged:    DME Agency:     HH Arranged:    HH Agency:     Status of Service:  Completed, signed off  If discussed at [employer], dates discussed:    Additional Comments:  Lanier Clam, RN 05/20/2016, 12:15 PM

## 2016-05-20 NOTE — Progress Notes (Addendum)
Patient is a planned Dc today to SNF: [facility].  Call placed to facility regarding time son can do paperwork and when bed is available. Message left and LCSW will facilitate DC once call returned. [facility] is aware of DC and agreeable. Once DC summary available will send to facility and arrange for transport.  Son:  [name]  [phone] to be called and explained details for next steps after dc.son aware of plan and will be over to [facility] around 12:00pm to sign paperwork  Most likely patient will transport by EMS due to current function level and needs/ O2 being used.  Son agreeable to transport   Patient has Medicare and BCBC, per SNF: BCBS is primary and Medicare is secondary per facility.  Will need auth prior to sending patient out.    11:53 AM Bed is available, Auth obtained. Transport to be called with RN ready. Will follow up with completed plan. RN aware.  Deretha Emory, MSW Clinical Social Work: Optician, dispensing Coverage for :  [phone]

## 2016-06-03 ENCOUNTER — Encounter (INDEPENDENT_AMBULATORY_CARE_PROVIDER_SITE_OTHER): Payer: Self-pay | Admitting: Orthopaedic Surgery

## 2016-06-03 ENCOUNTER — Ambulatory Visit (INDEPENDENT_AMBULATORY_CARE_PROVIDER_SITE_OTHER): Payer: Medicare Other | Admitting: Orthopaedic Surgery

## 2016-06-03 DIAGNOSIS — Z96652 Presence of left artificial knee joint: Secondary | ICD-10-CM

## 2016-06-03 NOTE — Progress Notes (Signed)
   Office Visit Note   Patient: [name]           Date of Birth: April 28, 1941           MRN: [id number] Visit Date: 06/03/2016              Requested by: [name], MD [address] [place], [place] [address] PCP: Dorrene German, MD   Assessment & Plan: Visit Diagnoses:  1. Status post total knee replacement, left     Plan: doing well; follow-up 4 weeks; no x-rays needed. HHPT after skilled nursing  Follow-Up Instructions: Return in about 4 weeks (around 07/01/2016).   Orders:  No orders of the defined types were placed in this encounter.  Meds ordered this encounter  Medications  . lisinopril (PRINIVIL,ZESTRIL) 10 MG tablet    Refill:  0      Procedures: No procedures performed   Clinical Data: No additional findings.   Subjective: Chief Complaint  Patient presents with  . Left Knee - Routine Post Op    [facility] Knee doing well. Getting good ROM and strength.  Doing well.  Son with her.  He wants her to go home with him after skilled nursing.  HPI  Review of Systems   Objective: Vital Signs: There were no vitals taken for this visit.  Physical Exam Incision well healed.  New steir-strips applied. ROM 0 - 90 degrees Calf soft  Ortho Exam  Specialty Comments:  No specialty comments available.  Imaging: No results found.   PMFS History: Patient Active Problem List   Diagnosis Date Noted  . Osteoarthritis of left knee 05/17/2016  . 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 Arthritis   . Asthma   . Chronic back pain   . Depression   . Hyperlipemia   . Hypertension   . Scoliosis   . Sleep apnea    cpap - settingsi at 3     Family History  Problem Relation Age of Onset  . Other Father     kidney problems    Past Surgical History:  Procedure Laterality Date  . ARTHROSCOPIC REPAIR ACL  02/03/2004  . OOPHORECTOMY  1970  . SPINE SURGERY   03/31/2006  . TOTAL KNEE ARTHROPLASTY Left 05/17/2016   Procedure: LEFT TOTAL KNEE ARTHROPLASTY;  Surgeon: Kathryne Hitch, MD;  Location: [facility] ORS;  Service: Orthopedics;  Laterality: Left;  Adductor Block; Spinal to General  . TUBAL LIGATION  1983   Social History   Occupational History  .  Retired   Social History Main Topics  . Smoking status: Former Smoker    Packs/day: 1.00    Years: 10.00    Types: Cigarettes    Quit date: 08/12/1973  . Smokeless tobacco: Never Used  . Alcohol use No  . Drug use: No  . Sexual activity: Not on file

## 2016-06-04 ENCOUNTER — Ambulatory Visit: Payer: Medicare Other | Admitting: Podiatry

## 2016-06-14 ENCOUNTER — Inpatient Hospital Stay (HOSPITAL_COMMUNITY)
Admission: EM | Admit: 2016-06-14 | Discharge: 2016-06-18 | DRG: 603 | Disposition: A | Discharge status: Home Health | Payer: Medicare Other | Attending: Orthopaedic Surgery | Admitting: Orthopaedic Surgery

## 2016-06-14 ENCOUNTER — Telehealth (INDEPENDENT_AMBULATORY_CARE_PROVIDER_SITE_OTHER): Payer: Self-pay

## 2016-06-14 ENCOUNTER — Other Ambulatory Visit (INDEPENDENT_AMBULATORY_CARE_PROVIDER_SITE_OTHER): Payer: Self-pay | Admitting: Physician Assistant

## 2016-06-14 ENCOUNTER — Encounter (HOSPITAL_COMMUNITY): Payer: Self-pay

## 2016-06-14 DIAGNOSIS — I10 Essential (primary) hypertension: Secondary | ICD-10-CM | POA: Diagnosis present

## 2016-06-14 DIAGNOSIS — L03116 Cellulitis of left lower limb: Secondary | ICD-10-CM

## 2016-06-14 DIAGNOSIS — E669 Obesity, unspecified: Secondary | ICD-10-CM | POA: Diagnosis present

## 2016-06-14 DIAGNOSIS — J45909 Unspecified asthma, uncomplicated: Secondary | ICD-10-CM | POA: Diagnosis present

## 2016-06-14 DIAGNOSIS — G8929 Other chronic pain: Secondary | ICD-10-CM | POA: Diagnosis present

## 2016-06-14 DIAGNOSIS — M79609 Pain in unspecified limb: Secondary | ICD-10-CM | POA: Diagnosis not present

## 2016-06-14 DIAGNOSIS — Z96652 Presence of left artificial knee joint: Secondary | ICD-10-CM | POA: Diagnosis present

## 2016-06-14 DIAGNOSIS — M549 Dorsalgia, unspecified: Secondary | ICD-10-CM | POA: Diagnosis present

## 2016-06-14 DIAGNOSIS — Z79899 Other long term (current) drug therapy: Secondary | ICD-10-CM

## 2016-06-14 DIAGNOSIS — F329 Major depressive disorder, single episode, unspecified: Secondary | ICD-10-CM | POA: Diagnosis present

## 2016-06-14 DIAGNOSIS — Z9989 Dependence on other enabling machines and devices: Secondary | ICD-10-CM

## 2016-06-14 DIAGNOSIS — Z7983 Long term (current) use of bisphosphonates: Secondary | ICD-10-CM

## 2016-06-14 DIAGNOSIS — Z87891 Personal history of nicotine dependence: Secondary | ICD-10-CM

## 2016-06-14 DIAGNOSIS — E785 Hyperlipidemia, unspecified: Secondary | ICD-10-CM | POA: Diagnosis present

## 2016-06-14 DIAGNOSIS — Z6841 Body Mass Index (BMI) 40.0 and over, adult: Secondary | ICD-10-CM

## 2016-06-14 DIAGNOSIS — G4730 Sleep apnea, unspecified: Secondary | ICD-10-CM | POA: Diagnosis present

## 2016-06-14 DIAGNOSIS — M419 Scoliosis, unspecified: Secondary | ICD-10-CM | POA: Diagnosis present

## 2016-06-14 DIAGNOSIS — M79662 Pain in left lower leg: Secondary | ICD-10-CM | POA: Diagnosis not present

## 2016-06-14 LAB — BASIC METABOLIC PANEL
Anion gap: 7 (ref 5–15)
BUN: 9 mg/dL (ref 6–20)
CHLORIDE: 105 mmol/L (ref 101–111)
CO2: 27 mmol/L (ref 22–32)
CREATININE: 0.74 mg/dL (ref 0.44–1.00)
Calcium: 9 mg/dL (ref 8.9–10.3)
GFR calc Af Amer: >60 mL/min (ref >60)
GFR calc non Af Amer: >60 mL/min (ref >60)
GLUCOSE: 93 mg/dL (ref 65–99)
POTASSIUM: 3.7 mmol/L (ref 3.5–5.1)
Sodium: 139 mmol/L (ref 135–145)

## 2016-06-14 LAB — CBC WITH DIFFERENTIAL/PLATELET
Basophils Absolute: 0 10*3/uL (ref 0.0–0.1)
Basophils Relative: 0 %
EOS ABS: 0.3 10*3/uL (ref 0.0–0.7)
Eosinophils Relative: 3 %
HEMATOCRIT: 36.4 % (ref 36.0–46.0)
HEMOGLOBIN: 11.4 g/dL — AB (ref 12.0–15.0)
LYMPHS ABS: 1.7 10*3/uL (ref 0.7–4.0)
LYMPHS PCT: 22 %
MCH: 30.2 pg (ref 26.0–34.0)
MCHC: 31.3 g/dL (ref 30.0–36.0)
MCV: 96.3 fL (ref 78.0–100.0)
MONOS PCT: 8 %
Monocytes Absolute: 0.7 10*3/uL (ref 0.1–1.0)
NEUTROS PCT: 67 %
Neutro Abs: 5.4 10*3/uL (ref 1.7–7.7)
Platelets: 252 10*3/uL (ref 150–400)
RBC: 3.78 MIL/uL — AB (ref 3.87–5.11)
RDW: 14.3 % (ref 11.5–15.5)
WBC: 8 10*3/uL (ref 4.0–10.5)

## 2016-06-14 LAB — I-STAT CG4 LACTIC ACID, ED: LACTIC ACID, VENOUS: 1.14 mmol/L (ref 0.5–1.9)

## 2016-06-14 MED ORDER — METHOCARBAMOL 1000 MG/10ML IJ SOLN
500.0000 mg | Freq: Four times a day (QID) | INTRAVENOUS | Status: DC | PRN
  Filled 2016-06-14: qty 5

## 2016-06-14 MED ORDER — LISINOPRIL 20 MG PO TABS
40.0000 mg | ORAL_TABLET | Freq: Every day | ORAL | Status: DC
  Administered 2016-06-15 – 2016-06-18 (×4): 40 mg via ORAL
  Filled 2016-06-14 (×4): qty 2

## 2016-06-14 MED ORDER — ALBUTEROL SULFATE (2.5 MG/3ML) 0.083% IN NEBU: 2.5000 mg | INHALATION_SOLUTION | RESPIRATORY_TRACT | Status: DC | PRN

## 2016-06-14 MED ORDER — OXYCODONE HCL 5 MG PO TABS
5.0000 mg | ORAL_TABLET | ORAL | Status: DC | PRN
  Administered 2016-06-15 – 2016-06-18 (×10): 10 mg via ORAL
  Filled 2016-06-14 (×10): qty 2

## 2016-06-14 MED ORDER — ACETAMINOPHEN 650 MG RE SUPP: 650.0000 mg | Freq: Four times a day (QID) | RECTAL | Status: DC | PRN

## 2016-06-14 MED ORDER — BUSPIRONE HCL 10 MG PO TABS
30.0000 mg | ORAL_TABLET | Freq: Two times a day (BID) | ORAL | Status: DC
  Administered 2016-06-15 – 2016-06-18 (×6): 30 mg via ORAL
  Filled 2016-06-14 (×2): qty 3
  Filled 2016-06-14: qty 6
  Filled 2016-06-14 (×2): qty 3

## 2016-06-14 MED ORDER — POTASSIUM CHLORIDE ER 10 MEQ PO TBCR
10.0000 meq | EXTENDED_RELEASE_TABLET | Freq: Two times a day (BID) | ORAL | Status: DC | PRN
  Filled 2016-06-14: qty 1

## 2016-06-14 MED ORDER — ACETAMINOPHEN 325 MG PO TABS: 650.0000 mg | ORAL_TABLET | Freq: Four times a day (QID) | ORAL | Status: DC | PRN

## 2016-06-14 MED ORDER — ENOXAPARIN SODIUM 40 MG/0.4ML ~~LOC~~ SOLN
40.0000 mg | SUBCUTANEOUS | Status: DC
  Administered 2016-06-14 – 2016-06-15 (×2): 40 mg via SUBCUTANEOUS
  Filled 2016-06-14 (×2): qty 0.4

## 2016-06-14 MED ORDER — FENTANYL 75 MCG/HR TD PT72
75.0000 ug | MEDICATED_PATCH | TRANSDERMAL | Status: DC
  Administered 2016-06-15 – 2016-06-18 (×2): 75 ug via TRANSDERMAL
  Filled 2016-06-14 (×2): qty 1

## 2016-06-14 MED ORDER — ATORVASTATIN CALCIUM 20 MG PO TABS
20.0000 mg | ORAL_TABLET | Freq: Every day | ORAL | Status: DC
  Administered 2016-06-15 – 2016-06-18 (×4): 20 mg via ORAL
  Filled 2016-06-14 (×4): qty 1

## 2016-06-14 MED ORDER — DIPHENHYDRAMINE HCL 12.5 MG/5ML PO ELIX: 12.5000 mg | ORAL_SOLUTION | ORAL | Status: DC | PRN

## 2016-06-14 MED ORDER — VANCOMYCIN HCL IN DEXTROSE 1-5 GM/200ML-% IV SOLN
1000.0000 mg | Freq: Once | INTRAVENOUS | Status: AC
  Administered 2016-06-14: 1000 mg via INTRAVENOUS
  Filled 2016-06-14: qty 200

## 2016-06-14 MED ORDER — VENLAFAXINE HCL ER 75 MG PO CP24
75.0000 mg | ORAL_CAPSULE | Freq: Every day | ORAL | Status: DC
  Administered 2016-06-15 – 2016-06-18 (×4): 75 mg via ORAL
  Filled 2016-06-14 (×4): qty 1

## 2016-06-14 MED ORDER — DONEPEZIL HCL 10 MG PO TABS
10.0000 mg | ORAL_TABLET | Freq: Every evening | ORAL | Status: DC
  Administered 2016-06-15 – 2016-06-17 (×3): 10 mg via ORAL
  Filled 2016-06-14 (×4): qty 1

## 2016-06-14 MED ORDER — FUROSEMIDE 40 MG PO TABS: 40.0000 mg | ORAL_TABLET | Freq: Every day | ORAL | Status: DC | PRN

## 2016-06-14 MED ORDER — MECLIZINE HCL 25 MG PO TABS
25.0000 mg | ORAL_TABLET | Freq: Three times a day (TID) | ORAL | Status: DC | PRN
  Filled 2016-06-14: qty 1

## 2016-06-14 MED ORDER — ADULT MULTIVITAMIN W/MINERALS CH
1.0000 | ORAL_TABLET | Freq: Every day | ORAL | Status: DC
  Administered 2016-06-15 – 2016-06-18 (×4): 1 via ORAL
  Filled 2016-06-14 (×4): qty 1

## 2016-06-14 MED ORDER — VANCOMYCIN HCL 10 G IV SOLR
1250.0000 mg | INTRAVENOUS | Status: DC
  Administered 2016-06-15 – 2016-06-18 (×4): 1250 mg via INTRAVENOUS
  Filled 2016-06-14 (×4): qty 1250

## 2016-06-14 MED ORDER — PIPERACILLIN-TAZOBACTAM 3.375 G IVPB
3.3750 g | Freq: Three times a day (TID) | INTRAVENOUS | Status: DC
  Administered 2016-06-15 – 2016-06-17 (×7): 3.375 g via INTRAVENOUS
  Filled 2016-06-14 (×8): qty 50

## 2016-06-14 MED ORDER — SODIUM CHLORIDE 0.9 % IV BOLUS (SEPSIS)
500.0000 mL | Freq: Once | INTRAVENOUS | Status: AC
  Administered 2016-06-14: 500 mL via INTRAVENOUS

## 2016-06-14 MED ORDER — SODIUM CHLORIDE 0.9 % IV SOLN
INTRAVENOUS | Status: DC
  Administered 2016-06-14: 23:00:00 via INTRAVENOUS

## 2016-06-14 MED ORDER — ARIPIPRAZOLE 5 MG PO TABS
5.0000 mg | ORAL_TABLET | Freq: Every day | ORAL | Status: DC
  Administered 2016-06-15 – 2016-06-18 (×4): 5 mg via ORAL
  Filled 2016-06-14 (×4): qty 1

## 2016-06-14 MED ORDER — PIPERACILLIN-TAZOBACTAM 3.375 G IVPB 30 MIN
3.3750 g | Freq: Once | INTRAVENOUS | Status: AC
  Administered 2016-06-14: 3.375 g via INTRAVENOUS
  Filled 2016-06-14: qty 50

## 2016-06-14 MED ORDER — METHOCARBAMOL 500 MG PO TABS
500.0000 mg | ORAL_TABLET | Freq: Four times a day (QID) | ORAL | Status: DC | PRN
  Administered 2016-06-18: 500 mg via ORAL
  Filled 2016-06-14: qty 1

## 2016-06-14 NOTE — Telephone Encounter (Signed)
[name] from [facility] health called and states pt has  poss infection. Warm to touch severe pain. Dr Magnus Ivan is not in clinic today and advise for her to go to  [facility] ER since Dr Magnus Ivan is on call today.

## 2016-06-14 NOTE — Progress Notes (Signed)
Pharmacy Antibiotic Follow-up Note  [name] is a 75 y.o. year-old female admitted on 06/14/2016.  The patient is currently on day 1 of Vancomycin & Zosyn for cellulitis, rule out joint infection.  Assessment/Plan: Vancomycin 1gm x1 in ED, then 1240mg  IV every 24 hours.  Goal trough 10-15 mcg/mL. Zosyn 3.375g IV q8h (4 hour infusion).  Temp (24hrs), Avg:98.1 F (36.7 C), Min:98.1 F (36.7 C), Max:98.1 F (36.7 C)   Recent Labs Lab 06/14/16 2020  WBC 8.0    Recent Labs Lab 06/14/16 2020  CREATININE 0.74   Estimated Creatinine Clearance: 68 mL/min (by C-G formula based on SCr of 0.74 mg/dL).    Allergies  Allergen Reactions  . Codeine Itching   Antimicrobials this admission: 11/3 Vancomycin  >>  11/3 Zosyn >>   Levels/dose changes this admission:  Microbiology results: 11/3 BCx: sent  Thank you for allowing pharmacy to be a part of this patient's care.  Otho Bellows PharmD 06/14/2016 9:33 PM

## 2016-06-14 NOTE — ED Triage Notes (Signed)
Pt had L knee replacement on 10/6. Family states that since then the L knee and lower leg has been hot to touch and swollen. Pt endorses lethargy, but denies N/V/F/Chills. Pt startes on Keflex 2 days ago. Visiting nurse sent pt to ER this evening. A&Ox4. Pitting edema, erythema, and heat noted in triage.

## 2016-06-14 NOTE — ED Provider Notes (Addendum)
[facility]-EMERGENCY DEPT Provider Note   CSN: [id number] Arrival date & time: 06/14/16  1809     History   Chief Complaint Chief Complaint  Patient presents with  . Leg Swelling  . Cellulitis    HPI [name] is a 75 y.o. female.  Patient c/o increased left leg/knee pain in the past week.  Hx left knee TKA 05/17/16 Magnus Ivan) - states initially was doing well post-op and that now is getting more painful and erythematous. Saw pcp 2 days ago, who gave rx keflex. Visit nurse saw tonight and referred to ED. Denies chills/sweats. No chest pain or sob. No leg numbness/weakness. No drainage from incision.      The history is provided by the patient.    Past Medical History:  Diagnosis Date  . Arthritis   . Asthma   . Chronic back pain   . Depression   . Hyperlipemia   . Hypertension   . Scoliosis   . Sleep apnea    cpap - settingsi at 3     Patient Active Problem List   Diagnosis Date Noted  . Osteoarthritis of left knee 05/17/2016  . 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 ARTHROSCOPIC REPAIR ACL  02/03/2004  . OOPHORECTOMY  1970  . SPINE SURGERY  03/31/2006  . TOTAL KNEE ARTHROPLASTY Left 05/17/2016   Procedure: LEFT TOTAL KNEE ARTHROPLASTY;  Surgeon: Kathryne Hitch, MD;  Location: [facility];  Service: Orthopedics;  Laterality: Left;  Adductor Block; Spinal to General  . TUBAL LIGATION  1983    OB History    No data available       Home Medications    Prior to Admission medications   Medication Sig Start Date End Date Taking? Authorizing Provider  alendronate (FOSAMAX) 70 MG tablet Take 70 mg by mouth every Sunday. Take with a full glass of water on an empty stomach.    Historical Provider, MD  ARIPiprazole (ABILIFY) 5 MG tablet Take 5 mg by mouth daily. 04/01/16   Historical Provider, MD  atorvastatin (LIPITOR) 20 MG tablet Take 20 mg by mouth daily.    Historical Provider, MD    busPIRone (BUSPAR) 30 MG tablet Take 30 mg by mouth 2 (two) times daily. 04/26/16   Historical Provider, MD  desvenlafaxine (PRISTIQ) 50 MG 24 hr tablet Take 50 mg by mouth daily.     Historical Provider, MD  donepezil (ARICEPT) 10 MG tablet Take 10 mg by mouth at bedtime.    Historical Provider, MD  fentaNYL (DURAGESIC - DOSED MCG/HR) 75 MCG/HR Place 75 mcg onto the skin every 3 (three) days.  07/17/15   Historical Provider, MD  fexofenadine (ALLEGRA) 180 MG tablet Take 180 mg by mouth daily as needed for allergies.     Historical Provider, MD  furosemide (LASIX) 40 MG tablet Take 40 mg by mouth daily as needed for fluid.     Historical Provider, MD  lisinopril (PRINIVIL,ZESTRIL) 10 MG tablet  05/17/16   Historical Provider, MD  lisinopril (PRINIVIL,ZESTRIL) 40 MG tablet Take 40 mg by mouth daily.     Historical Provider, MD  meclizine (ANTIVERT) 25 MG tablet Take 25 mg by mouth 3 (three) times daily as needed for dizziness.    Historical Provider, MD  methocarbamol (ROBAXIN) 500 MG tablet Take 1 tablet (500 mg total) by mouth every 6 (six) hours as needed for muscle spasms. 05/19/16   Kathryne Hitch, MD  Multiple Vitamin (MULTIVITAMIN WITH MINERALS) TABS tablet Take 1 tablet by mouth daily.    Historical Provider, MD  oxyCODONE (ROXICODONE) 15 MG immediate release tablet Take 1 tablet (15 mg total) by mouth every 3 (three) hours as needed for pain. 05/19/16   Kathryne Hitch, MD  potassium chloride (KLOR-CON 10) 10 MEQ tablet Take 10 mEq by mouth 2 (two) times daily as needed (for swelling).     Historical Provider, MD  prednisoLONE acetate (PRED FORTE) 1 % ophthalmic suspension Place 1 drop into both eyes 3 (three) times daily as needed (for dry eyes).     Historical Provider, MD  PROAIR HFA 108 (90 BASE) MCG/ACT inhaler Inhale 2 puffs into the lungs every 4 (four) hours as needed for wheezing or shortness of breath.  04/27/14   Historical Provider, MD  rivaroxaban (XARELTO) 10 MG TABS  tablet Take 1 tablet (10 mg total) by mouth daily with breakfast. 05/20/16   Kathryne Hitch, MD    Family History Family History  Problem Relation Age of Onset  . Other Father     kidney problems    Social History Social History  Substance Use Topics  . Smoking status: Former Smoker    Packs/day: 1.00    Years: 10.00    Types: Cigarettes    Quit date: 08/12/1973  . Smokeless tobacco: Never Used  . Alcohol use No     Allergies   Codeine   Review of Systems Review of Systems  Constitutional: Negative for fever.  HENT: Negative for sore throat.   Eyes: Negative for redness.  Respiratory: Negative for shortness of breath.   Cardiovascular: Negative for chest pain.  Gastrointestinal: Negative for nausea and vomiting.  Genitourinary: Negative for flank pain.  Musculoskeletal: Negative for back pain.  Skin: Negative for rash.  Neurological: Negative for headaches.  Hematological: Does not bruise/bleed easily.  Psychiatric/Behavioral: Negative for confusion.     Physical Exam Updated Vital Signs BP 177/72 (BP Location: Left Arm)   Pulse (!) 57   Temp 98.1 F (36.7 C) (Oral)   Resp 18   Ht 5\' 2"  (1.575 m)   Wt 102.1 kg   SpO2 93%   BMI 41.15 kg/m   Physical Exam  Constitutional: She appears well-developed and well-nourished. No distress.  Eyes: Conjunctivae are normal. No scleral icterus.  Neck: Neck supple. No tracheal deviation present.  Cardiovascular: Normal rate, regular rhythm, normal heart sounds and intact distal pulses.   Pulmonary/Chest: Effort normal and breath sounds normal. No respiratory distress.  Abdominal: Soft. Normal appearance and bowel sounds are normal. She exhibits no distension. There is no tenderness.  Musculoskeletal:  Left leg is more swollen than right, has had recent left tka. Incision healing without purulent drainage. There is increased warmth and erythema to left leg, extending from medial aspect mid left thigh to superior  aspect lower leg.  With slow, passive rom left knee, no pain in knee joint. Distal pulses palp.   Neurological: She is alert.  Skin: Skin is warm and dry. No rash noted.  Psychiatric: She has a normal mood and affect.  Nursing note and vitals reviewed.    ED Treatments / Results  Labs (all labs ordered are listed, but only abnormal results are displayed) Labs Reviewed  CULTURE, BLOOD (ROUTINE X 2)  CULTURE, BLOOD (ROUTINE X 2)  CBC WITH DIFFERENTIAL/PLATELET  BASIC METABOLIC PANEL    EKG  EKG Interpretation None       Radiology No results found.  Procedures Procedures (including critical care time)  Medications Ordered in ED Medications  sodium chloride 0.9 % bolus 500 mL (not administered)     Initial Impression / Assessment and Plan / ED Course  I have reviewed the triage vital signs and the nursing notes.  Pertinent labs & imaging results that were available during my care of the patient were reviewed by me and considered in my medical decision making (see chart for details).  Clinical Course    Iv ns. Labs and cultures sent.  Iv abx.  Pt/family request their orthopedist be called - Dr Magnus IvanBlackman paged.   Reviewed nursing notes and prior charts for additional history.   Discussed pt with Dr Magnus IvanBlackman, he indicates to admit to med surg bed, 6e, he will see.   Zosyn and vanc iv.   Patient may also benefit for vascular doppler study, r/o dvt, in AM when available.     Final Clinical Impressions(s) / ED Diagnoses   Final diagnoses:  None    New Prescriptions New Prescriptions   No medications on file         Cathren LaineKevin Kelsie Kramp, MD 06/14/16 2128

## 2016-06-14 NOTE — H&P (Signed)
[name] is an 75 y.o. female.   Chief Complaint:   Left lower leg swelling and redness HPI:   75 yo female well known to me who underwent an uncomplicated left total knee replacement early last month (approx 4 weeks ago).  Developed a left lower leg cellulitis earlier this week and was started on oral antibiotics by her PCP.  She presented to the ED tonight with worsening symptoms.  Past Medical History:  Diagnosis Date  . Arthritis   . Asthma   . Chronic back pain   . Depression   . Hyperlipemia   . Hypertension   . Scoliosis   . Sleep apnea    cpap - settingsi at 2     Past Surgical History:  Procedure Laterality Date  . ARTHROSCOPIC REPAIR ACL  02/03/2004  . OOPHORECTOMY  1970  . SPINE SURGERY  03/31/2006  . 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 TUBAL LIGATION  1983    Family History  Problem Relation Age of Onset  . Other Father     kidney problems   Social History:  reports that she quit smoking about 42 years ago. Her smoking use included Cigarettes. She has a 10.00 pack-year smoking history. She has never used smokeless tobacco. She reports that she does not drink alcohol or use drugs.  Allergies:  Allergies  Allergen Reactions  . Codeine Itching     (Not in a hospital admission)  Results for orders placed or performed during the hospital encounter of 06/14/16 (from the past 48 hour(s))  CBC with Differential/Platelet     Status: Abnormal   Collection Time: 06/14/16  8:20 PM  Result Value Ref Range   WBC 8.0 4.0 - 10.5 K/uL   RBC 3.78 (L) 3.87 - 5.11 MIL/uL   Hemoglobin 11.4 (L) 12.0 - 15.0 g/dL   HCT 36.4 36.0 - 46.0 %   MCV 96.3 78.0 - 100.0 fL   MCH 30.2 26.0 - 34.0 pg   MCHC 31.3 30.0 - 36.0 g/dL   RDW 14.3 11.5 - 15.5 %   Platelets 252 150 - 400 K/uL   Neutrophils  Relative % 67 %   Neutro Abs 5.4 1.7 - 7.7 K/uL   Lymphocytes Relative 22 %   Lymphs Abs 1.7 0.7 - 4.0 K/uL   Monocytes Relative 8 %   Monocytes Absolute 0.7 0.1 - 1.0 K/uL   Eosinophils Relative 3 %   Eosinophils Absolute 0.3 0.0 - 0.7 K/uL   Basophils Relative 0 %   Basophils Absolute 0.0 0.0 - 0.1 K/uL  Basic metabolic panel     Status: None   Collection Time: 06/14/16  8:20 PM  Result Value Ref Range   Sodium 139 135 - 145 mmol/L   Potassium 3.7 3.5 - 5.1 mmol/L   Chloride 105 101 - 111 mmol/L   CO2 27 22 - 32 mmol/L   Glucose, Bld 93 65 - 99 mg/dL   BUN 9 6 - 20 mg/dL   Creatinine, Ser 0.74 0.44 - 1.00 mg/dL   Calcium 9.0 8.9 - 10.3 mg/dL   GFR calc non Af Amer >60 >60 mL/min   GFR calc Af Amer >60 >60 mL/min    Comment: (NOTE) The eGFR has been calculated using the CKD EPI equation. This calculation has not been validated in  all clinical situations. eGFR's persistently <60 mL/min signify possible Chronic Kidney Disease.    Anion gap 7 5 - 15  I-Stat CG4 Lactic Acid, ED     Status: None   Collection Time: 06/14/16  8:35 PM  Result Value Ref Range   Lactic Acid, Venous 1.14 0.5 - 1.9 mmol/L   No results found.  ROS  Blood pressure 182/75, pulse (!) 57, temperature 98.1 F (36.7 C), temperature source Oral, resp. rate 18, height '5\' 2"'  (1.575 m), weight 225 lb (102.1 kg), SpO2 95 %. Physical Exam  Constitutional: She is oriented to person, place, and time. She appears well-developed and well-nourished.  HENT:  Head: Normocephalic and atraumatic.  Eyes: EOM are normal. Pupils are equal, round, and reactive to light.  Neck: Normal range of motion. Neck supple.  Cardiovascular: Normal rate and regular rhythm.   Respiratory: Effort normal and breath sounds normal.  GI: Soft.  Musculoskeletal:       Legs: Neurological: She is alert and oriented to person, place, and time.  Skin: Skin is warm and dry.  Psychiatric: She has a normal mood and affect.      Assessment/Plan Left lower leg cellulitis around a month post a left total knee replacement  This does not seem to involve her recent joint replacement.  She does not report any significant knee pain and I can flex and extend her knee easily.  I plan to admit her for IV antibiotics to see how she responds to this and monitor her total knee closely.  Right now, it does not appear to need a surgical intervention.  Will start Vanc and Zosyn for now.  Certainly, we may need to also rule out a DVT.  Will start anticoagulation as well.  Mcarthur Rossetti, MD 06/14/2016, 10:32 PM

## 2016-06-15 LAB — BASIC METABOLIC PANEL WITH GFR
Anion gap: 6 (ref 5–15)
BUN: 7 mg/dL (ref 6–20)
CO2: 28 mmol/L (ref 22–32)
Calcium: 8.6 mg/dL — ABNORMAL LOW (ref 8.9–10.3)
Chloride: 106 mmol/L (ref 101–111)
Creatinine, Ser: 0.57 mg/dL (ref 0.44–1.00)
GFR calc Af Amer: >60 mL/min (ref >60)
GFR calc non Af Amer: >60 mL/min (ref >60)
Glucose, Bld: 96 mg/dL (ref 65–99)
Potassium: 3.6 mmol/L (ref 3.5–5.1)
Sodium: 140 mmol/L (ref 135–145)

## 2016-06-15 LAB — CBC
HCT: 32.9 % — ABNORMAL LOW (ref 36.0–46.0)
HEMOGLOBIN: 10 g/dL — AB (ref 12.0–15.0)
MCH: 29.7 pg (ref 26.0–34.0)
MCHC: 30.4 g/dL (ref 30.0–36.0)
MCV: 97.6 fL (ref 78.0–100.0)
Platelets: 225 10*3/uL (ref 150–400)
RBC: 3.37 MIL/uL — AB (ref 3.87–5.11)
RDW: 14.5 % (ref 11.5–15.5)
WBC: 5.7 10*3/uL (ref 4.0–10.5)

## 2016-06-15 LAB — SEDIMENTATION RATE: Sed Rate: 14 mm/h (ref 0–22)

## 2016-06-15 LAB — C-REACTIVE PROTEIN

## 2016-06-15 NOTE — Progress Notes (Signed)
Subjective: She reports that her left leg already feels better since starting IV antibiotics last evening.   Objective: Vital signs in last 24 hours: Temp:  [98.1 F (36.7 C)-98.6 F (37 C)] 98.6 F (37 C) (11/04 0512) Pulse Rate:  [52-64] 60 (11/04 0512) Resp:  [16-18] 16 (11/04 0512) BP: (163-185)/(72-82) 165/74 (11/04 0512) SpO2:  [92 %-100 %] 93 % (11/04 0512) Weight:  [225 lb (102.1 kg)] 225 lb (102.1 kg) (11/03 1832)  Intake/Output from previous day: 11/03 0701 - 11/04 0700 In: 656.3 [I.V.:56.3; IV Piggyback:600] Out: -  Intake/Output this shift: No intake/output data recorded.   Recent Labs  06/14/16 2020 06/15/16 0500  HGB 11.4* 10.0*    Recent Labs  06/14/16 2020 06/15/16 0500  WBC 8.0 5.7  RBC 3.78* 3.37*  HCT 36.4 32.9*  PLT 252 225    Recent Labs  06/14/16 2020 06/15/16 0500  NA 139 140  K 3.7 3.6  CL 105 106  CO2 27 28  BUN 9 7  CREATININE 0.74 0.57  GLUCOSE 93 96  CALCIUM 9.0 8.6*   No results for input(s): LABPT, INR in the last 72 hours.  Her WBC and ESR are normal.  Her CR-P is pending.    Sensation intact distally Intact pulses distally Dorsiflexion/Plantar flexion intact Incision: no drainage Compartment soft  The left leg swelling has decreased and the redness is much less.   Assessment/Plan: Continue IV antibiotics to treat her cellulitis. Can be up with therapy.   Mcarthur Rossetti 06/15/2016, 7:20 AM

## 2016-06-16 MED ORDER — RIVAROXABAN 10 MG PO TABS
10.0000 mg | ORAL_TABLET | Freq: Every day | ORAL | Status: DC
  Administered 2016-06-16: 10 mg via ORAL
  Filled 2016-06-16: qty 1

## 2016-06-16 MED ORDER — RIVAROXABAN 15 MG PO TABS: 15.0000 mg | ORAL_TABLET | Freq: Two times a day (BID) | ORAL | Status: DC

## 2016-06-16 NOTE — Evaluation (Signed)
Physical Therapy Evaluation Patient Details Name: [name] MRN: [id number] DOB: [birth year] Today's Date: 06/16/2016   History of Present Illness  Pt admitted 06/14/16 with L LE cellulitis and hx of L TKR 05/17/16  Clinical Impression  Pt admitted as above and presenting with functional mobility limitations 2* decreased L LE strength/ROM, generalized weakness and L LE pain increased with activity.  Pt should progress to dc home with family assist and continuation of post TKR HHPT    Follow Up Recommendations Home health PT    Equipment Recommendations  None recommended by PT    Recommendations for Other Services OT consult     Precautions / Restrictions Precautions Precautions: Knee;Fall Restrictions Weight Bearing Restrictions: No Other Position/Activity Restrictions: WBAT      Mobility  Bed Mobility               General bed mobility comments: NT - Pt OOB with nursing and requests back to bed  Transfers Overall transfer level: Needs assistance Equipment used: Rolling walker (2 wheeled) Transfers: Sit to/from Stand Sit to Stand: Min guard;Supervision            Ambulation/Gait Ambulation/Gait assistance: Min guard Ambulation Distance (Feet): 140 Feet Assistive device: Rolling walker (2 wheeled) Gait Pattern/deviations: Step-through pattern;Decreased step length - right;Decreased step length - left;Shuffle;Trunk flexed Gait velocity: decr Gait velocity interpretation: Below normal speed for age/gender General Gait Details: cues for posture and position from RW  Stairs            Wheelchair Mobility    Modified Rankin (Stroke Patients Only)       Balance                                             Pertinent Vitals/Pain Pain Assessment: 0-10 Pain Score: 4  Pain Location: L LE Pain Descriptors / Indicators: Aching;Sore Pain Intervention(s): Limited activity within patient's tolerance;Monitored during  session;Patient requesting pain meds-RN notified    Home Living Family/patient expects to be discharged to:: Private residence Living Arrangements: Children;Other relatives Available Help at Discharge: Family Type of Home: House Home Access: Stairs to enter   Entrance Stairs-Number of Steps: 2   Home Equipment: Walker - 2 wheels;Cane - single point      Prior Function Level of Independence: Needs assistance   Gait / Transfers Assistance Needed: Pt reports had progressed to cane for ambulation      Comments: granddaughter lives with her (goes to work at 3); helps her into Oncologistshower     Hand Dominance        Extremity/Trunk Assessment   Upper Extremity Assessment: Generalized weakness           Lower Extremity Assessment: Generalized weakness      Cervical / Trunk Assessment: Kyphotic  Communication   Communication: No difficulties  Cognition Arousal/Alertness: Awake/alert Behavior During Therapy: WFL for tasks assessed/performed Overall Cognitive Status: Within Functional Limits for tasks assessed                      General Comments      Exercises Total Joint Exercises Ankle Circles/Pumps: AROM;Both;15 reps;Supine Quad Sets: AROM;Both;10 reps;Supine Heel Slides: AAROM;Left;15 reps;Supine Straight Leg Raises: AROM;AAROM;Left;10 reps;Supine   Assessment/Plan    PT Assessment Patient needs continued PT services  PT Problem List Decreased strength;Decreased range of motion;Decreased activity tolerance;Decreased mobility;Decreased knowledge of use  of DME;Pain;Obesity          PT Treatment Interventions DME instruction;Gait training;Stair training;Functional mobility training;Therapeutic activities;Therapeutic exercise;Patient/family education    PT Goals (Current goals can be found in the Care Plan section)  Acute Rehab PT Goals Patient Stated Goal: Resume HHPT for TKR and regain IND PT Goal Formulation: With patient Time For Goal Achievement:  06/22/16 Potential to Achieve Goals: Good    Frequency Min 4X/week   Barriers to discharge        Co-evaluation               End of Session Equipment Utilized During Treatment: Gait belt Activity Tolerance: Patient tolerated treatment well;Patient limited by fatigue Patient left: in chair;with call bell/phone within reach Nurse Communication: Mobility status         Time: 1100-1129 PT Time Calculation (min) (ACUTE ONLY): 29 min   Charges:   PT Evaluation $PT Eval Low Complexity: 1 Procedure PT Treatments $Gait Training: 8-22 mins   PT G Codes:        Rakesh Dutko 07/07/16, 12:58 PM

## 2016-06-16 NOTE — Progress Notes (Signed)
Patient ID: [name], female   DOB: 1941/02/08, 75 y.o.   MRN: [id number] Looks much better overall today.  Cellulitis has resolved.  Her left calf is soft and there is no redness and minimal pain.  All of her infectious labs/markes are normal (ESR, CR-P, WBC).  Will discharge to home tomorrow on oral antibiotics.

## 2016-06-17 ENCOUNTER — Inpatient Hospital Stay (HOSPITAL_COMMUNITY): Payer: Medicare Other

## 2016-06-17 DIAGNOSIS — M79609 Pain in unspecified limb: Secondary | ICD-10-CM

## 2016-06-17 LAB — BASIC METABOLIC PANEL
ANION GAP: 6 (ref 5–15)
BUN: 9 mg/dL (ref 6–20)
CHLORIDE: 103 mmol/L (ref 101–111)
CO2: 29 mmol/L (ref 22–32)
CREATININE: 0.69 mg/dL (ref 0.44–1.00)
Calcium: 8.7 mg/dL — ABNORMAL LOW (ref 8.9–10.3)
GFR calc non Af Amer: >60 mL/min (ref >60)
Glucose, Bld: 118 mg/dL — ABNORMAL HIGH (ref 65–99)
POTASSIUM: 3.6 mmol/L (ref 3.5–5.1)
SODIUM: 138 mmol/L (ref 135–145)

## 2016-06-17 MED ORDER — HYDRALAZINE HCL 25 MG PO TABS
25.0000 mg | ORAL_TABLET | Freq: Three times a day (TID) | ORAL | Status: DC
  Administered 2016-06-17 – 2016-06-18 (×4): 25 mg via ORAL
  Filled 2016-06-17 (×4): qty 1

## 2016-06-17 MED ORDER — HYDRALAZINE HCL 25 MG PO TABS: 25.0000 mg | ORAL_TABLET | Freq: Once | ORAL | Status: DC

## 2016-06-17 NOTE — Progress Notes (Signed)
*  PRELIMINARY RESULTS* Vascular Ultrasound Left lower extremity venous duplex has been completed.  Preliminary findings: no evidence of DVT. 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   Farrel Demark, RDMS, RVT  06/17/2016, 9:16 AM

## 2016-06-17 NOTE — Progress Notes (Signed)
Physical Therapy Treatment Patient Details Name: [name] MRN: [id number] DOB: [birth year] Today's Date: 06/17/2016    History of Present Illness Pt admitted 06/14/16 with L LE cellulitis and hx of L TKR 05/17/16    PT Comments    Assisted OOB and tolerated amb a greater distance.  Performed TKR TE's although 4 weeks out.  Tolerated well.  Follow Up Recommendations  Home health PT     Equipment Recommendations  None recommended by PT    Recommendations for Other Services       Precautions / Restrictions Precautions Precautions: Knee;Fall Restrictions Weight Bearing Restrictions: No Other Position/Activity Restrictions: WBAT    Mobility  Bed Mobility Overal bed mobility: Needs Assistance             General bed mobility comments: increased time and MAX encouragement to self perform  Transfers Overall transfer level: Needs assistance Equipment used: Rolling walker (2 wheeled) Transfers: Sit to/from Stand Sit to Stand: Supervision;Min guard         General transfer comment: 25% VC's on safety with turns and reaching back prior to sit  Ambulation/Gait Ambulation/Gait assistance: Supervision;Min guard Ambulation Distance (Feet): 185 Feet Assistive device: Rolling walker (2 wheeled) Gait Pattern/deviations: Step-through pattern Gait velocity: decreased   General Gait Details: pt had progressed to a cane at home however we uaed a walker due to acute leg pain.  Tolerated well.    Stairs            Wheelchair Mobility    Modified Rankin (Stroke Patients Only)       Balance                                    Cognition Arousal/Alertness: Awake/alert Behavior During Therapy: WFL for tasks assessed/performed Overall Cognitive Status: Within Functional Limits for tasks assessed                      Exercises   Total Knee Replacement TE's 10 reps B LE ankle pumps 10 reps towel squeezes 10 reps knee presses 10  reps heel slides  10 reps SAQ's 10 reps SLR's 10 reps ABD Followed by ICE     General Comments        Pertinent Vitals/Pain Pain Assessment: Faces Faces Pain Scale: Hurts a little bit Pain Location: L LE Pain Descriptors / Indicators: Aching Pain Intervention(s): Monitored during session    Home Living                      Prior Function            PT Goals (current goals can now be found in the care plan section) Progress towards PT goals: Progressing toward goals    Frequency    Min 4X/week      PT Plan Current plan remains appropriate    Co-evaluation             End of Session Equipment Utilized During Treatment: Gait belt Activity Tolerance: Patient tolerated treatment well;Patient limited by fatigue Patient left: in chair;with call bell/phone within reach     Time: 1445-1510 PT Time Calculation (min) (ACUTE ONLY): 25 min  Charges:  $Gait Training: 8-22 mins $Therapeutic Exercise: 8-22 mins                    G Codes:      Felecia Shelling  PTA [facility]  Acute  Rehab Pager      [phone]

## 2016-06-17 NOTE — Progress Notes (Signed)
Patient active with [facility] prior to admission.

## 2016-06-17 NOTE — Progress Notes (Signed)
Patient ID: [name], female   DOB: 26-Oct-1940, 75 y.o.   MRN: [id number] Cellulitis has resolved.  Left leg still a little painful.  Remains quite hypertensive as well.  Will order a venous doppler today to rule out a DVT and try add hydralazine to treat her hypertension.

## 2016-06-18 ENCOUNTER — Telehealth (INDEPENDENT_AMBULATORY_CARE_PROVIDER_SITE_OTHER): Payer: Self-pay | Admitting: Orthopaedic Surgery

## 2016-06-18 MED ORDER — DOXYCYCLINE HYCLATE 100 MG PO CAPS: 100.0000 mg | ORAL_CAPSULE | Freq: Two times a day (BID) | ORAL | 0 refills | Status: DC

## 2016-06-18 NOTE — Progress Notes (Signed)
Physical Therapy Treatment Patient Details Name: [name] MRN: [id number] DOB: 1940/09/17 Today's Date: 06/18/2016    History of Present Illness Pt admitted 06/14/16 with L LE cellulitis and hx of L TKR 05/17/16    PT Comments    Returned to complete L TKR TE's due to need for more pain meds and rest. Performed 10 reps all supine TE's. AAROM L knee 0 - 95 degrees.   Follow Up Recommendations  Home health PT     Equipment Recommendations  None recommended by PT    Recommendations for Other Services       Precautions / Restrictions Precautions Precautions: Knee;Fall Restrictions Weight Bearing Restrictions: No Other Position/Activity Restrictions: WBAT       Balance                                    Cognition Arousal/Alertness: Awake/alert Behavior During Therapy: WFL for tasks assessed/performed Overall Cognitive Status: Within Functional Limits for tasks assessed                      Exercises   Total Knee Replacement TE's 10 reps B LE ankle pumps 10 reps towel squeezes 10 reps knee presses 10 reps heel slides  10 reps SAQ's 10 reps SLR's 10 reps ABD Followed by ICE    General Comments        Pertinent Vitals/Pain Pain Assessment: 0-10 Pain Score: 5  Pain Location: L knee Pain Descriptors / Indicators: Aching Pain Intervention(s): Monitored during session;Ice applied;Patient requesting pain meds-RN notified    Home Living                      Prior Function            PT Goals (current goals can now be found in the care plan section) Progress towards PT goals: Progressing toward goals    Frequency    Min 4X/week      PT Plan Current plan remains appropriate    Co-evaluation             End of Session Equipment Utilized During Treatment: Gait belt Activity Tolerance: Patient tolerated treatment well;Patient limited by fatigue Patient left: in chair;with call bell/phone within reach      Time: 1145-1155 PT Time Calculation (min) (ACUTE ONLY): 10 min  Charges:   $Therapeutic Exercise: 8-22 mins                     G Codes:      Felecia Shelling  PTA [facility]  Acute  Rehab Pager      [phone]

## 2016-06-18 NOTE — Care Management Important Message (Signed)
Important Message  Patient Details  Name: [name] MRN: [id number] Date of Birth: 06-01-1941   Medicare Important Message Given:  Yes    Haskell Flirt 06/18/2016, 9:13 AMImportant Message  Patient Details  Name: [name] MRN: [id number] Date of Birth: Dec 31, 1940   Medicare Important Message Given:  Yes    Haskell Flirt 06/18/2016, 9:13 AM

## 2016-06-18 NOTE — Progress Notes (Signed)
Patient ID: [name], female   DOB: 05-15-1941, 75 y.o.   MRN: [id number] Cellulitis has resolved.  No left knee pain and no left knee effusion.  Calf soft.  Doppler negative for DVT.  Can be discharged to home today.

## 2016-06-18 NOTE — Telephone Encounter (Signed)
Sarah (nurse) with [facility] called needing Home Health and face to face orders. She advised the order have to be put in by the doctor. The number to contact her [phone]

## 2016-06-18 NOTE — Progress Notes (Signed)
Pt active with [facility] and this CM has faxed facesheet, orders, face to face, H&P, and DC summary to [phone].  No other CM needs were communicated.

## 2016-06-18 NOTE — Progress Notes (Signed)
RN reviewed discharge education with patient and family. All questions answered.   Paperwork and prescriptions given.   NT rolled patient down with all belongings to family car.  

## 2016-06-18 NOTE — Progress Notes (Signed)
Physical Therapy Treatment Patient Details Name: [name] MRN: [id number] DOB: 09/22/1940 Today's Date: 06/18/2016    History of Present Illness Pt admitted 06/14/16 with L LE cellulitis and hx of L TKR 05/17/16    PT Comments    Pt progressing well with mobility and plans to D/C to home today.  Assisted with amb and stairs.   Follow Up Recommendations  Home health PT     Equipment Recommendations  None recommended by PT    Recommendations for Other Services       Precautions / Restrictions Precautions Precautions: Knee;Fall Restrictions Weight Bearing Restrictions: No Other Position/Activity Restrictions: WBAT    Mobility  Bed Mobility               General bed mobility comments: NT OOB in recliner  Transfers Overall transfer level: Needs assistance Equipment used: Rolling walker (2 wheeled) Transfers: Sit to/from Stand           General transfer comment: 25% VC's on safety with turns and reaching back prior to sit plus increased time to rise and lower  Ambulation/Gait Ambulation/Gait assistance: Supervision Ambulation Distance (Feet): 125 Feet Assistive device: Rolling walker (2 wheeled) Gait Pattern/deviations: Step-through pattern Gait velocity: decreased   General Gait Details: pt had progressed to a cane at home however we uaed a walker due to acute leg pain.  Tolerated well.    Stairs Stairs: Yes Stairs assistance: Min assist Stair Management: No rails;Step to pattern;Forwards;With walker Number of Stairs: 2 General stair comments: 25% VC's on proper sequencing and safety  Wheelchair Mobility    Modified Rankin (Stroke Patients Only)       Balance                                    Cognition Arousal/Alertness: Awake/alert Behavior During Therapy: WFL for tasks assessed/performed Overall Cognitive Status: Within Functional Limits for tasks assessed                      Exercises      General  Comments        Pertinent Vitals/Pain Pain Assessment: 0-10 Pain Score: 5  Pain Location: L knee Pain Descriptors / Indicators: Aching Pain Intervention(s): Monitored during session;Ice applied;Patient requesting pain meds-RN notified    Home Living                      Prior Function            PT Goals (current goals can now be found in the care plan section) Progress towards PT goals: Progressing toward goals    Frequency    Min 4X/week      PT Plan Current plan remains appropriate    Co-evaluation             End of Session Equipment Utilized During Treatment: Gait belt Activity Tolerance: Patient tolerated treatment well;Patient limited by fatigue Patient left: in chair;with call bell/phone within reach     Time: 1000-1028 PT Time Calculation (min) (ACUTE ONLY): 28 min  Charges:  $Gait Training: 8-22 mins $Therapeutic Activity: 8-22 mins                    G Codes:      Felecia Shelling  PTA [facility]  Acute  Rehab Pager      [phone]

## 2016-06-18 NOTE — Discharge Instructions (Signed)
Increase your activities as comfort allows. You need to see your primary care doctor soon to continue to watch your blood pressure closely.

## 2016-06-18 NOTE — Discharge Summary (Signed)
Patient ID: [name] MRN: [id number] DOB/AGE: 76/14/1942 76 y.o.  Admit date: 06/14/2016 Discharge date: 06/18/2016  Admission Diagnoses:  Active Problems:   Cellulitis of left leg   Discharge Diagnoses:  Same  Past Medical History:  Diagnosis Date  . Arthritis   . Asthma   . Chronic back pain   . Depression   . Hyperlipemia   . Hypertension   . Scoliosis   . Sleep apnea    cpap - settingsi at 3     Surgeries:  on    Consultants:   Discharged Condition: Improved  Hospital Course: [name] is an 75 y.o. female who was admitted 06/14/2016 for cellulitis of her left leg.   Her WBC, ESR, and CR=p were all normal and she showed no evidence of involvement of her recent left knee replacement.  After a few days of IV antibiotics, her cellulitis resolved.  A doppler of her left leg was also negative for a DVT.  Patient was given perioperative antibiotics: Anti-infectives    Start     Dose/Rate Route Frequency Ordered Stop   06/18/16 0000  doxycycline (VIBRAMYCIN) 100 MG capsule     100 mg Oral 2 times daily 06/18/16 0701     06/15/16 1000  vancomycin (VANCOCIN) 1,250 mg in sodium chloride 0.9 % 250 mL IVPB     1,250 mg 166.7 mL/hr over 90 Minutes Intravenous Every 24 hours 06/14/16 2154     06/15/16 0600  piperacillin-tazobactam (ZOSYN) IVPB 3.375 g  Status:  Discontinued     3.375 g 12.5 mL/hr over 240 Minutes Intravenous Every 8 hours 06/14/16 2154 06/17/16 0708   06/14/16 2130  piperacillin-tazobactam (ZOSYN) IVPB 3.375 g     3.375 g 100 mL/hr over 30 Minutes Intravenous  Once 06/14/16 2126 06/14/16 2233   06/14/16 2130  vancomycin (VANCOCIN) IVPB 1000 mg/200 mL premix     1,000 mg 200 mL/hr over 60 Minutes Intravenous  Once 06/14/16 2126 06/14/16 2244       Patient was given sequential compression devices, early ambulation, and chemoprophylaxis to prevent DVT.  Patient benefited maximally from hospital stay and there were no complications.     Recent vital signs: Patient Vitals for the past 24 hrs:  BP Temp Temp src Pulse Resp SpO2  06/18/16 0559 (!) 156/77 98.5 F (36.9 C) Oral (!) 58 16 100 %  06/17/16 2229 (!) 182/69 98.2 F (36.8 C) Oral 60 16 92 %  06/17/16 1348 (!) 161/68 - - - - -  06/17/16 1302 (!) 157/61 98 F (36.7 C) Oral (!) 58 17 95 %  06/17/16 0827 (!) 166/60 - - 89 - -     Recent laboratory studies:  Recent Labs  06/17/16 0812  NA 138  K 3.6  CL 103  CO2 29  BUN 9  CREATININE 0.69  GLUCOSE 118*  CALCIUM 8.7*     Discharge Medications:     Medication List    STOP taking these medications   cephALEXin 500 MG capsule Commonly known as:  KEFLEX   rivaroxaban 10 MG Tabs tablet Commonly known as:  XARELTO     TAKE these medications   alendronate 70 MG tablet Commonly known as:  FOSAMAX Take 70 mg by mouth every Sunday. Take with a full glass of water on an empty stomach.   ARIPiprazole 5 MG tablet Commonly known as:  ABILIFY Take 5 mg by mouth daily.   atorvastatin 20 MG tablet Commonly known as:  LIPITOR  Take 20 mg by mouth daily.   busPIRone 30 MG tablet Commonly known as:  BUSPAR Take 30 mg by mouth 2 (two) times daily.   desvenlafaxine 50 MG 24 hr tablet Commonly known as:  PRISTIQ Take 50 mg by mouth daily.   donepezil 10 MG tablet Commonly known as:  ARICEPT Take 10 mg by mouth every evening.   doxycycline 100 MG capsule Commonly known as:  VIBRAMYCIN Take 1 capsule (100 mg total) by mouth 2 (two) times daily.   fentaNYL 75 MCG/HR Commonly known as:  DURAGESIC - dosed mcg/hr Place 75 mcg onto the skin every 3 (three) days.   fexofenadine 180 MG tablet Commonly known as:  ALLEGRA Take 180 mg by mouth daily as needed for allergies.   furosemide 40 MG tablet Commonly known as:  LASIX Take 40 mg by mouth daily as needed for fluid.   KLOR-CON 10 10 MEQ tablet Generic drug:  potassium chloride Take 10 mEq by mouth 2 (two) times daily as needed (for swelling).    lisinopril 40 MG tablet Commonly known as:  PRINIVIL,ZESTRIL Take 40 mg by mouth daily.   meclizine 25 MG tablet Commonly known as:  ANTIVERT Take 25 mg by mouth 3 (three) times daily as needed for dizziness.   methocarbamol 500 MG tablet Commonly known as:  ROBAXIN Take 1 tablet (500 mg total) by mouth every 6 (six) hours as needed for muscle spasms.   multivitamin with minerals Tabs tablet Take 1 tablet by mouth daily.   oxyCODONE 15 MG immediate release tablet Commonly known as:  ROXICODONE Take 1 tablet (15 mg total) by mouth every 3 (three) hours as needed for pain.   prednisoLONE acetate 1 % ophthalmic suspension Commonly known as:  PRED FORTE Place 1 drop into both eyes 3 (three) times daily as needed (for dry eyes).   PROAIR HFA 108 (90 Base) MCG/ACT inhaler Generic drug:  albuterol Inhale 2 puffs into the lungs every 4 (four) hours as needed for wheezing or shortness of breath.       Diagnostic Studies: No results found.  Disposition:  To home  Discharge Instructions    Discharge patient    Complete by:  As directed       Follow-up Information    Mcarthur Rossetti, MD. Schedule an appointment as soon as possible for a visit in 2 week(s).   Specialty:  Orthopedic Surgery Contact information: [place] [address] [phone]        Philis Fendt, MD. Schedule an appointment as soon as possible for a visit in 1 week(s).   Specialty:  Internal Medicine Why:  Need to have blood pressure closely followed Contact information: [place] [address] [phone]            Signed: Mcarthur Rossetti 06/18/2016, 7:01 AM

## 2016-06-18 NOTE — Telephone Encounter (Signed)
Please advise 

## 2016-06-20 LAB — CULTURE, BLOOD (ROUTINE X 2)
CULTURE: NO GROWTH
Culture: NO GROWTH

## 2016-06-26 ENCOUNTER — Telehealth (INDEPENDENT_AMBULATORY_CARE_PROVIDER_SITE_OTHER): Payer: Self-pay | Admitting: *Deleted

## 2016-06-26 NOTE — Telephone Encounter (Signed)
Verbal order given no VM

## 2016-06-26 NOTE — Telephone Encounter (Signed)
Onalee Hua from [facility] is calling for cont. Orders for balance, gait ROM strengthening, fall prevention, 2 week 3 1 week 1 CB: [phone]

## 2016-07-01 ENCOUNTER — Encounter (INDEPENDENT_AMBULATORY_CARE_PROVIDER_SITE_OTHER): Payer: Self-pay | Admitting: Physician Assistant

## 2016-07-01 ENCOUNTER — Ambulatory Visit (INDEPENDENT_AMBULATORY_CARE_PROVIDER_SITE_OTHER): Payer: Medicare Other | Admitting: Physician Assistant

## 2016-07-01 DIAGNOSIS — Z96652 Presence of left artificial knee joint: Secondary | ICD-10-CM

## 2016-07-01 NOTE — Progress Notes (Signed)
   Office Visit Note   Patient: [name]           Date of Birth: November 23, 1940           MRN: [id number] Visit Date: 07/01/2016              Requested by: Sarah Contras, MD [address] [place], [place] [address] PCP: Sarah German, MD   Assessment & Plan: Visit Diagnoses: No diagnosis found.  Plan: Compression stockings and elevation were encouraged. She will work on her range of motion strengthening of the left knee. Scar tissue mobilization encouraged of the left surgical incision. Moisturizer to left to prevent excoriation.   Follow-Up Instructions: Return in about 2 weeks (around 07/15/2016) for wound check.   Orders:  No orders of the defined types were placed in this encounter.  No orders of the defined types were placed in this encounter.     Procedures: No procedures performed   Clinical Data: No additional findings.   Subjective: No chief complaint on file.   [name] is here following Left Total Knee Arthroplasty on 05/17/16. She went to hospital on 06/14/16 for leg swelling.  She states that today she is doing pretty good with it.  She states that the swelling has improved some.      Review of Systems   Objective: Vital Signs: There were no vitals taken for this visit.  Physical Exam  Ortho Exam Left lower leg calf supple nontender. Edema of the left lower leg is present but no cellulitis. Surgical incision benign. Full extension and flexion to 110 left knee. Specialty Comments:  No specialty comments available.  Imaging: No results found.   PMFS History: Patient Active Problem List   Diagnosis Date Noted  . Cellulitis of left leg 06/14/2016  . Osteoarthritis of left knee 05/17/2016  . 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 Arthritis   . Asthma   . Chronic back pain   . Depression   . Hyperlipemia   . Hypertension   . Scoliosis   . Sleep apnea    cpap - settingsi at  3     Family History  Problem Relation Age of Onset  . Other Father     kidney problems    Past Surgical History:  Procedure Laterality Date  . ARTHROSCOPIC REPAIR ACL  02/03/2004  . OOPHORECTOMY  1970  . SPINE SURGERY  03/31/2006  . TOTAL KNEE ARTHROPLASTY Left 05/17/2016   Procedure: LEFT TOTAL KNEE ARTHROPLASTY;  Surgeon: Kathryne Hitch, MD;  Location: [facility];  Service: Orthopedics;  Laterality: Left;  Adductor Block; Spinal to General  . TUBAL LIGATION  1983   Social History   Occupational History  .  Retired   Social History Main Topics  . Smoking status: Former Smoker    Packs/day: 1.00    Years: 10.00    Types: Cigarettes    Quit date: 08/12/1973  . Smokeless tobacco: Never Used  . Alcohol use No  . Drug use: No  . Sexual activity: Not on file

## 2016-07-15 ENCOUNTER — Ambulatory Visit (INDEPENDENT_AMBULATORY_CARE_PROVIDER_SITE_OTHER): Payer: Medicare Other | Admitting: Physician Assistant

## 2016-07-15 DIAGNOSIS — Z96652 Presence of left artificial knee joint: Secondary | ICD-10-CM

## 2016-07-15 NOTE — Progress Notes (Signed)
   Post-Op Visit Note   Patient: [name]           Date of Birth: 26-Mar-1941           MRN: [id number] Visit Date: 07/15/2016 PCP: Dorrene German, MD   Assessment & Plan:  Chief Complaint:  Chief Complaint  Patient presents with  . Left Knee - Routine Post Op   Visit Diagnoses:  1. Status post total left knee replacement    EXAM: Left calf supple nontender. Full extension flexion to 110 left knee. No instability. Surgical incision healing well.  Plan: Continue to work on strengthening , gait and balance. Work on scar tissue mobilization  Follow-Up Instructions: Return in about 4 weeks (around 08/12/2016).   Orders:  No orders of the defined types were placed in this encounter.  No orders of the defined types were placed in this encounter.  HPI Patient returns status post left total knee arthroplasty on 05/17/2016.  She is 8 weeks 3 days post op.  She states that the knee is doing pretty good. She is not having too much pain. The swelling has gone down a lot. She is wearing compression stockings and keeping leg elevated as well. She is doing physical therapy and has stationary bike at home. She takes Oxycodone for pain. She is in pain management.   PMFS History: Patient Active Problem List   Diagnosis Date Noted  . Cellulitis of left leg 06/14/2016  . Osteoarthritis of left knee 05/17/2016  . 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 Arthritis   . Asthma   . Chronic back pain   . Depression   . Hyperlipemia   . Hypertension   . Scoliosis   . Sleep apnea    cpap - settingsi at 3     Family History  Problem Relation Age of Onset  . Other Father     kidney problems    Past Surgical History:  Procedure Laterality Date  . ARTHROSCOPIC REPAIR ACL  02/03/2004  . OOPHORECTOMY  1970  . SPINE SURGERY  03/31/2006  . TOTAL KNEE ARTHROPLASTY Left 05/17/2016   Procedure: LEFT TOTAL KNEE ARTHROPLASTY;  Surgeon:  Kathryne Hitch, MD;  Location: [facility];  Service: Orthopedics;  Laterality: Left;  Adductor Block; Spinal to General  . TUBAL LIGATION  1983   Social History   Occupational History  .  Retired   Social History Main Topics  . Smoking status: Former Smoker    Packs/day: 1.00    Years: 10.00    Types: Cigarettes    Quit date: 08/12/1973  . Smokeless tobacco: Never Used  . Alcohol use No  . Drug use: No  . Sexual activity: Not on file

## 2016-07-18 ENCOUNTER — Emergency Department (HOSPITAL_COMMUNITY): Payer: Medicare Other

## 2016-07-18 ENCOUNTER — Encounter (HOSPITAL_COMMUNITY): Payer: Self-pay | Admitting: Emergency Medicine

## 2016-07-18 ENCOUNTER — Emergency Department (HOSPITAL_COMMUNITY)
Admission: EM | Admit: 2016-07-18 | Discharge: 2016-07-18 | Disposition: A | Discharge status: Home / Self Care | Payer: Medicare Other | Attending: Emergency Medicine | Admitting: Emergency Medicine

## 2016-07-18 DIAGNOSIS — J45909 Unspecified asthma, uncomplicated: Secondary | ICD-10-CM | POA: Insufficient documentation

## 2016-07-18 DIAGNOSIS — Z96652 Presence of left artificial knee joint: Secondary | ICD-10-CM | POA: Diagnosis not present

## 2016-07-18 DIAGNOSIS — R519 Headache, unspecified: Secondary | ICD-10-CM

## 2016-07-18 DIAGNOSIS — I10 Essential (primary) hypertension: Secondary | ICD-10-CM | POA: Diagnosis present

## 2016-07-18 DIAGNOSIS — Z87891 Personal history of nicotine dependence: Secondary | ICD-10-CM | POA: Insufficient documentation

## 2016-07-18 DIAGNOSIS — R51 Headache: Secondary | ICD-10-CM | POA: Insufficient documentation

## 2016-07-18 LAB — CBC WITH DIFFERENTIAL/PLATELET
BASOS ABS: 0 10*3/uL (ref 0.0–0.1)
BASOS PCT: 0 %
EOS ABS: 0.2 10*3/uL (ref 0.0–0.7)
EOS PCT: 2 %
HCT: 41.7 % (ref 36.0–46.0)
Hemoglobin: 13.1 g/dL (ref 12.0–15.0)
LYMPHS ABS: 1.8 10*3/uL (ref 0.7–4.0)
Lymphocytes Relative: 21 %
MCH: 29.4 pg (ref 26.0–34.0)
MCHC: 31.4 g/dL (ref 30.0–36.0)
MCV: 93.7 fL (ref 78.0–100.0)
Monocytes Absolute: 0.6 10*3/uL (ref 0.1–1.0)
Monocytes Relative: 7 %
NEUTROS PCT: 70 %
Neutro Abs: 5.9 10*3/uL (ref 1.7–7.7)
PLATELETS: 194 10*3/uL (ref 150–400)
RBC: 4.45 MIL/uL (ref 3.87–5.11)
RDW: 14.1 % (ref 11.5–15.5)
WBC: 8.5 10*3/uL (ref 4.0–10.5)

## 2016-07-18 LAB — BASIC METABOLIC PANEL
Anion gap: 10 (ref 5–15)
BUN: 13 mg/dL (ref 6–20)
CO2: 27 mmol/L (ref 22–32)
Calcium: 9.4 mg/dL (ref 8.9–10.3)
Chloride: 102 mmol/L (ref 101–111)
Creatinine, Ser: 0.64 mg/dL (ref 0.44–1.00)
Glucose, Bld: 104 mg/dL — ABNORMAL HIGH (ref 65–99)
POTASSIUM: 3.7 mmol/L (ref 3.5–5.1)
SODIUM: 139 mmol/L (ref 135–145)

## 2016-07-18 MED ORDER — IBUPROFEN 200 MG PO TABS
400.0000 mg | ORAL_TABLET | Freq: Once | ORAL | Status: AC | PRN
  Administered 2016-07-18: 400 mg via ORAL
  Filled 2016-07-18: qty 2

## 2016-07-18 NOTE — ED Notes (Signed)
Family upset pt needs an EKG.

## 2016-07-18 NOTE — ED Notes (Signed)
Patient transported to CT 

## 2016-07-18 NOTE — ED Provider Notes (Signed)
[facility]-EMERGENCY DEPT Provider Note   CSN: [id number] Arrival date & time: 07/18/16  1214     History   Chief Complaint Chief Complaint  Patient presents with  . Hypertension  . Headache   Triage note: Pt reports her BP was elevated at home with systolic BP at 200. Pt has hx of HTN. Was told by PCP to take extra dose of her thiazide. BP decreased upon arrival to ED. No recent decreases in BP medication. Pt had dizziness and HA earlier which has decreased. Dizziness resolved upon arrival to ED, still has some HA. No SOB or CP  HPI [name] is a 75 y.o. female.  The history is provided by the patient.  Hypertension  This is a chronic problem. The problem occurs daily. Progression since onset: fluctuating. Associated symptoms include headaches. Pertinent negatives include no chest pain, no abdominal pain and no shortness of breath. Nothing aggravates the symptoms. Nothing relieves the symptoms. Treatments tried: HCTZ. The treatment provided moderate relief.  Headache   This is a new problem. The current episode started 6 to 12 hours ago. The problem occurs constantly. The problem has been rapidly improving. Associated with: HTN. Pain location: entire head; now frontal. The quality of the pain is described as dull. The pain is at a severity of 2/10 (currently). The pain does not radiate. Pertinent negatives include no anorexia, no fever, no malaise/fatigue, no near-syncope, no orthopnea, no palpitations, no syncope, no shortness of breath, no nausea and no vomiting. She has tried NSAIDs for the symptoms. The treatment provided moderate relief.    Past Medical History:  Diagnosis Date  . Arthritis   . Asthma   . Chronic back pain   . Depression   . Hyperlipemia   . Hypertension   . Scoliosis   . Sleep apnea    cpap - settingsi at 3     Patient Active Problem List   Diagnosis Date Noted  . Cellulitis of left leg 06/14/2016  . Osteoarthritis of left knee 05/17/2016  .  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 ARTHROSCOPIC REPAIR ACL  02/03/2004  . OOPHORECTOMY  1970  . SPINE SURGERY  03/31/2006  . TOTAL KNEE ARTHROPLASTY Left 05/17/2016   Procedure: LEFT TOTAL KNEE ARTHROPLASTY;  Surgeon: Kathryne Hitchhristopher Y Blackman, MD;  Location: [facility];  Service: Orthopedics;  Laterality: Left;  Adductor Block; Spinal to General  . TUBAL LIGATION  1983    OB History    No data available       Home Medications    Prior to Admission medications   Medication Sig Start Date End Date Taking? Authorizing Provider  acetaminophen (TYLENOL) 500 MG tablet Take 500 mg by mouth every 6 (six) hours as needed for mild pain, moderate pain or headache.   Yes Historical Provider, MD  alendronate (FOSAMAX) 70 MG tablet Take 70 mg by mouth every Sunday. Take with a full glass of water on an empty stomach.   Yes Historical Provider, MD  ARIPiprazole (ABILIFY) 5 MG tablet Take 5 mg by mouth every morning.  04/01/16  Yes Historical Provider, MD  atorvastatin (LIPITOR) 20 MG tablet Take 20 mg by mouth every morning.    Yes Historical Provider, MD  busPIRone (BUSPAR) 30 MG tablet Take 30 mg by mouth 2 (two) times daily. 04/26/16  Yes Historical Provider, MD  desvenlafaxine (PRISTIQ) 50 MG 24 hr tablet Take 50 mg by mouth every  morning.    Yes Historical Provider, MD  donepezil (ARICEPT) 10 MG tablet Take 10 mg by mouth every evening.    Yes Historical Provider, MD  fentaNYL (DURAGESIC - DOSED MCG/HR) 75 MCG/HR Place 75 mcg onto the skin every 3 (three) days.  07/17/15  Yes Historical Provider, MD  fexofenadine (ALLEGRA) 180 MG tablet Take 180 mg by mouth daily as needed for allergies.    Yes Historical Provider, MD  furosemide (LASIX) 40 MG tablet Take 40 mg by mouth daily as needed for fluid.    Yes Historical Provider, MD  hydrochlorothiazide (HYDRODIURIL) 12.5 MG tablet Take 12.5 mg by mouth daily. 07/01/16  Yes Historical  Provider, MD  lisinopril (PRINIVIL,ZESTRIL) 40 MG tablet Take 40 mg by mouth daily.    Yes Historical Provider, MD  meclizine (ANTIVERT) 25 MG tablet Take 25 mg by mouth every 6 (six) hours as needed for dizziness.    Yes Historical Provider, MD  methocarbamol (ROBAXIN) 500 MG tablet Take 1 tablet (500 mg total) by mouth every 6 (six) hours as needed for muscle spasms. 05/19/16  Yes Kathryne Hitch, MD  Multiple Vitamin (MULTIVITAMIN WITH MINERALS) TABS tablet Take 1 tablet by mouth every morning.    Yes Historical Provider, MD  oxyCODONE (ROXICODONE) 15 MG immediate release tablet Take 1 tablet (15 mg total) by mouth every 3 (three) hours as needed for pain. Patient taking differently: Take 15 mg by mouth every 8 (eight) hours as needed for pain.  05/19/16  Yes Kathryne Hitch, MD  potassium chloride (KLOR-CON 10) 10 MEQ tablet Take 10 mEq by mouth 2 (two) times daily as needed (for swelling).    Yes Historical Provider, MD  prednisoLONE acetate (PRED FORTE) 1 % ophthalmic suspension Place 1 drop into both eyes 3 (three) times daily as needed (for dry eyes).    Yes Historical Provider, MD  PROAIR HFA 108 (90 BASE) MCG/ACT inhaler Inhale 2 puffs into the lungs every 4 (four) hours as needed for wheezing or shortness of breath.  04/27/14  Yes Historical Provider, MD  doxycycline (VIBRAMYCIN) 100 MG capsule Take 1 capsule (100 mg total) by mouth 2 (two) times daily. Patient not taking: Reported on 07/18/2016 06/18/16   Kathryne Hitch, MD    Family History Family History  Problem Relation Age of Onset  . Other Father     kidney problems    Social History Social History  Substance Use Topics  . Smoking status: Former Smoker    Packs/day: 1.00    Years: 10.00    Types: Cigarettes    Quit date: 08/12/1973  . Smokeless tobacco: Never Used  . Alcohol use No     Allergies   Codeine   Review of Systems Review of Systems  Constitutional: Negative for fever and  malaise/fatigue.  Respiratory: Negative for shortness of breath.   Cardiovascular: Negative for chest pain, palpitations, orthopnea, syncope and near-syncope.  Gastrointestinal: Negative for abdominal pain, anorexia, nausea and vomiting.  Neurological: Positive for headaches.  Ten systems are reviewed and are negative for acute change except as noted in the HPI    Physical Exam Updated Vital Signs BP 192/91 (BP Location: Left Arm)   Pulse (!) 54   Temp 97.7 F (36.5 C) (Oral)   Resp 18   SpO2 95%   Physical Exam  Constitutional: She is oriented to person, place, and time. She appears well-developed and well-nourished. No distress.  HENT:  Head: Normocephalic and atraumatic.  Nose: Nose normal.  Eyes: Conjunctivae and EOM are normal. Right eye exhibits no discharge. Left eye exhibits no discharge. No scleral icterus.  Fundoscopic exam:      The right eye shows no hemorrhage and no papilledema.       The left eye shows no hemorrhage and no papilledema.  Neck: Normal range of motion. Neck supple.  Cardiovascular: Normal rate and regular rhythm.  Exam reveals no gallop and no friction rub.   No murmur heard. Pulmonary/Chest: Effort normal and breath sounds normal. No stridor. No respiratory distress. She has no rales.  Abdominal: Soft. She exhibits no distension. There is no tenderness.  Musculoskeletal: She exhibits no edema or tenderness.  Neurological: She is alert and oriented to person, place, and time.  Mental Status: Alert and oriented to person, place, and time. Attention and concentration normal. Speech clear. Recent memory is intac  Cranial Nerves  II Visual Fields: Intact to confrontation. Visual fields intact. III, IV, VI: Pupils equal and reactive to light and near. Full eye movement without nystagmus  V Facial Sensation: Normal. No weakness of masticatory muscles  VII: No facial weakness or asymmetry  VIII Auditory Acuity: Grossly normal  IX/X: The uvula is  midline; the palate elevates symmetrically  XI: Normal sternocleidomastoid and trapezius strength  XII: The tongue is midline. No atrophy or fasciculations.   Motor System: Muscle Strength: 5/5 and symmetric in the upper and lower extremities. No pronation or drift.  Muscle Tone: Tone and muscle bulk are normal in the upper and lower extremities.   Reflexes: DTRs: 2+ and symmetrical in all four extremities. Plantar responses are flexor bilaterally.  Coordination: Intact finger-to-nose. No tremor.  Sensation: Intact to light touch. Gait: Routine gait antalgic, but at baseline per pt   Skin: Skin is warm and dry. No rash noted. She is not diaphoretic. No erythema.  Psychiatric: She has a normal mood and affect.  Vitals reviewed.    ED Treatments / Results  Labs (all labs ordered are listed, but only abnormal results are displayed) Labs Reviewed  BASIC METABOLIC PANEL - Abnormal; Notable for the following:       Result Value   Glucose, Bld 104 (*)    All other components within normal limits  CBC WITH DIFFERENTIAL/PLATELET    EKG  EKG Interpretation  Date/Time:  Thursday July 18 2016 13:02:11 EST Ventricular Rate:  59 PR Interval:    QRS Duration: 110 QT Interval:  460 QTC Calculation: 456 R Axis:   -46 Text Interpretation:  Sinus rhythm Left anterior fascicular block Anteroseptal infarct, age indeterminate Baseline wander in lead(s) V2 No significant change since last tracing Reconfirmed by [facility] MD, PEDRO [phone]) on 07/18/2016 5:39:28 PM       Radiology Ct Head Wo Contrast  Result Date: 07/18/2016 CLINICAL DATA:  Hypertension with dizziness and headache. EXAM: CT HEAD WITHOUT CONTRAST TECHNIQUE: Contiguous axial images were obtained from the base of the skull through the vertex without intravenous contrast. COMPARISON:  MRI of the brain 02/02/2014. FINDINGS: Brain: There is no evidence for acute hemorrhage, hydrocephalus, or abnormal extra-axial fluid collection. No  definite CT evidence for acute infarction. No evidence for mass lesion. Vascular: Atherosclerotic calcification is visualized in the carotid arteries. No dense MCA sign. Major dural sinuses are unremarkable. Skull: No evidence for fracture. No worrisome lytic or sclerotic lesion. Sinuses/Orbits: The visualized paranasal sinuses and mastoid air cells are clear. Visualized portions of the globes and intraorbital fat are unremarkable. Other: None. IMPRESSION: 1. No acute intracranial abnormality.  2. Atrophy with chronic small vessel white matter ischemic disease. Electronically Signed   By: Kennith Center M.D.   On: 07/18/2016 18:27    Procedures Procedures (including critical care time)  Medications Ordered in ED Medications  ibuprofen (ADVIL,MOTRIN) tablet 400 mg (400 mg Oral Given 07/18/16 1551)     Initial Impression / Assessment and Plan / ED Course  I have reviewed the triage vital signs and the nursing notes.  Pertinent labs & imaging results that were available during my care of the patient were reviewed by me and considered in my medical decision making (see chart for details).  Clinical Course     Workup without evidence of end organ damage, including CT head and basic metabolic panel. Patient denied any chest pain or shortness of breath concerning for ACS. EKG without acute ischemic changes.  Symptoms continue to improve.  Appropriate for discharge with strict return precautions and close PCP follow-up for continued hypertension management.  Final Clinical Impressions(s) / ED Diagnoses   Final diagnoses:  Acute nonintractable headache, unspecified headache type  Hypertension, unspecified type   Disposition: Discharge  Condition: Good  I have discussed the results, Dx and Tx plan with the patient who expressed understanding and agree(s) with the plan. Discharge instructions discussed at great length. The patient was given strict return precautions who verbalized understanding  of the instructions. No further questions at time of discharge.    Current Discharge Medication List      Follow Up: [name], MD [address] [place] [address] [phone]  Schedule an appointment as soon as possible for a visit  As needed      Nira Conn, MD 07/18/16 [phone]

## 2016-07-18 NOTE — ED Triage Notes (Signed)
Pt reports her BP was elevated at home with systolic BP at 200. Pt has hx of HTN. Was told by PCP to take extra dose of her thiazide. BP decreased upon arrival to ED. No recent decreases in BP medication. Pt had dizziness and HA earlier which has decreased. Dizziness resolved upon arrival to ED, still has some HA. No SOB or CP.

## 2016-07-18 NOTE — ED Notes (Signed)
ED Provider at bedside. 

## 2016-08-19 ENCOUNTER — Ambulatory Visit (INDEPENDENT_AMBULATORY_CARE_PROVIDER_SITE_OTHER): Payer: Medicare Other | Admitting: Podiatry

## 2016-08-19 ENCOUNTER — Ambulatory Visit (INDEPENDENT_AMBULATORY_CARE_PROVIDER_SITE_OTHER): Payer: Medicare Other | Admitting: Orthopaedic Surgery

## 2016-08-19 ENCOUNTER — Encounter: Payer: Self-pay | Admitting: Podiatry

## 2016-08-19 DIAGNOSIS — L608 Other nail disorders: Secondary | ICD-10-CM

## 2016-08-19 DIAGNOSIS — M79609 Pain in unspecified limb: Secondary | ICD-10-CM | POA: Diagnosis not present

## 2016-08-19 DIAGNOSIS — B351 Tinea unguium: Secondary | ICD-10-CM

## 2016-08-19 DIAGNOSIS — Z96652 Presence of left artificial knee joint: Secondary | ICD-10-CM

## 2016-08-19 DIAGNOSIS — L84 Corns and callosities: Secondary | ICD-10-CM | POA: Diagnosis not present

## 2016-08-19 DIAGNOSIS — L603 Nail dystrophy: Secondary | ICD-10-CM

## 2016-08-19 NOTE — Progress Notes (Signed)
SUBJECTIVE Patient  presents to office today complaining of elongated, thickened nails. Pain while ambulating in shoes. Patient is unable to trim their own nails.  Patient states that she recently underwent of total knee replacement to the left lower extremity on 05/17/2016.  OBJECTIVE General Patient is awake, alert, and oriented x 3 and in no acute distress. Derm painful hyperkeratotic callus lesion also noted to the distal tuft of the third digit right foot Skin is dry and supple bilateral. Negative open lesions or macerations. Remaining integument unremarkable. Nails are tender, long, thickened and dystrophic with subungual debris, consistent with onychomycosis, 1-5 bilateral. No signs of infection noted. Vasc  DP and PT pedal pulses palpable bilaterally. Temperature gradient within normal limits.  Neuro Epicritic and protective threshold sensation diminished bilaterally.  Musculoskeletal Exam No symptomatic pedal deformities noted bilateral. Muscular strength within normal limits.  ASSESSMENT 1. Onychodystrophic nails 1-5 bilateral with hyperkeratosis of nails.  2. Onychomycosis of nail due to dermatophyte bilateral 3. Pain in foot bilateral 4. Distal tuft callus third digit right foot  PLAN OF CARE 1. Patient evaluated today.  2. Instructed to maintain good pedal hygiene and foot care.  3. Mechanical debridement of nails 1-5 bilaterally performed using a nail nipper. Filed with dremel without incident.  4. Excisional debridement of callus lesion noted to the distal tuft of the third digit right foot  5. Return to clinic in 3 mos.    Felecia Shelling, DPM [facility]  Dr. Felecia Shelling, DPM    [address]                                        [place], [place] [address]                Office [phone]  Fax [phone]

## 2016-08-19 NOTE — Progress Notes (Signed)
The patient is just 2 days over 3 months status post a left total knee arthroplasty. She is doing well. She examined with a cane. She is an older 8. She has good range of motion strength she states.  On examination of her left knee the incisions well-healed. Extension is full and her flexion is 120 and it feels ligaments is stable. She has bilateral peripheral edema.  At this point she'll continue increase her activities as comfort allows.

## 2016-09-28 ENCOUNTER — Inpatient Hospital Stay (HOSPITAL_COMMUNITY)
Admission: EM | Admit: 2016-09-28 | Discharge: 2016-10-02 | DRG: 175 | Disposition: A | Discharge status: Skilled Nursing Facility | Payer: Medicare Other | Attending: Family Medicine | Admitting: Family Medicine

## 2016-09-28 ENCOUNTER — Emergency Department (HOSPITAL_COMMUNITY): Payer: Medicare Other

## 2016-09-28 ENCOUNTER — Observation Stay (HOSPITAL_COMMUNITY): Payer: Medicare Other

## 2016-09-28 ENCOUNTER — Encounter (HOSPITAL_COMMUNITY): Payer: Self-pay | Admitting: Nurse Practitioner

## 2016-09-28 DIAGNOSIS — Z96652 Presence of left artificial knee joint: Secondary | ICD-10-CM | POA: Diagnosis present

## 2016-09-28 DIAGNOSIS — Z9989 Dependence on other enabling machines and devices: Secondary | ICD-10-CM | POA: Diagnosis not present

## 2016-09-28 DIAGNOSIS — J181 Lobar pneumonia, unspecified organism: Secondary | ICD-10-CM | POA: Diagnosis not present

## 2016-09-28 DIAGNOSIS — G4733 Obstructive sleep apnea (adult) (pediatric): Secondary | ICD-10-CM | POA: Diagnosis not present

## 2016-09-28 DIAGNOSIS — Z79899 Other long term (current) drug therapy: Secondary | ICD-10-CM

## 2016-09-28 DIAGNOSIS — R509 Fever, unspecified: Secondary | ICD-10-CM | POA: Diagnosis not present

## 2016-09-28 DIAGNOSIS — G9340 Encephalopathy, unspecified: Secondary | ICD-10-CM | POA: Diagnosis present

## 2016-09-28 DIAGNOSIS — Z7982 Long term (current) use of aspirin: Secondary | ICD-10-CM

## 2016-09-28 DIAGNOSIS — G894 Chronic pain syndrome: Secondary | ICD-10-CM | POA: Diagnosis present

## 2016-09-28 DIAGNOSIS — G8929 Other chronic pain: Secondary | ICD-10-CM | POA: Diagnosis not present

## 2016-09-28 DIAGNOSIS — I2699 Other pulmonary embolism without acute cor pulmonale: Principal | ICD-10-CM | POA: Diagnosis present

## 2016-09-28 DIAGNOSIS — M7989 Other specified soft tissue disorders: Secondary | ICD-10-CM | POA: Diagnosis not present

## 2016-09-28 DIAGNOSIS — K219 Gastro-esophageal reflux disease without esophagitis: Secondary | ICD-10-CM | POA: Diagnosis present

## 2016-09-28 DIAGNOSIS — I10 Essential (primary) hypertension: Secondary | ICD-10-CM | POA: Diagnosis present

## 2016-09-28 DIAGNOSIS — R001 Bradycardia, unspecified: Secondary | ICD-10-CM | POA: Diagnosis not present

## 2016-09-28 DIAGNOSIS — I82432 Acute embolism and thrombosis of left popliteal vein: Secondary | ICD-10-CM | POA: Diagnosis present

## 2016-09-28 DIAGNOSIS — F329 Major depressive disorder, single episode, unspecified: Secondary | ICD-10-CM | POA: Diagnosis present

## 2016-09-28 DIAGNOSIS — M25562 Pain in left knee: Secondary | ICD-10-CM | POA: Diagnosis not present

## 2016-09-28 DIAGNOSIS — Z87891 Personal history of nicotine dependence: Secondary | ICD-10-CM

## 2016-09-28 DIAGNOSIS — E785 Hyperlipidemia, unspecified: Secondary | ICD-10-CM | POA: Diagnosis present

## 2016-09-28 DIAGNOSIS — I161 Hypertensive emergency: Secondary | ICD-10-CM | POA: Diagnosis present

## 2016-09-28 DIAGNOSIS — R918 Other nonspecific abnormal finding of lung field: Secondary | ICD-10-CM | POA: Diagnosis not present

## 2016-09-28 DIAGNOSIS — R4182 Altered mental status, unspecified: Secondary | ICD-10-CM

## 2016-09-28 DIAGNOSIS — J101 Influenza due to other identified influenza virus with other respiratory manifestations: Secondary | ICD-10-CM | POA: Diagnosis present

## 2016-09-28 DIAGNOSIS — J45909 Unspecified asthma, uncomplicated: Secondary | ICD-10-CM | POA: Diagnosis present

## 2016-09-28 DIAGNOSIS — I712 Thoracic aortic aneurysm, without rupture: Secondary | ICD-10-CM | POA: Diagnosis present

## 2016-09-28 LAB — CBC
HCT: 42.7 % (ref 36.0–46.0)
Hemoglobin: 14.1 g/dL (ref 12.0–15.0)
MCH: 31 pg (ref 26.0–34.0)
MCHC: 33 g/dL (ref 30.0–36.0)
MCV: 93.8 fL (ref 78.0–100.0)
PLATELETS: 156 10*3/uL (ref 150–400)
RBC: 4.55 MIL/uL (ref 3.87–5.11)
RDW: 14.8 % (ref 11.5–15.5)
WBC: 7.6 10*3/uL (ref 4.0–10.5)

## 2016-09-28 LAB — URINALYSIS, ROUTINE W REFLEX MICROSCOPIC
Bilirubin Urine: NEGATIVE
Glucose, UA: NEGATIVE mg/dL
Ketones, ur: NEGATIVE mg/dL
Leukocytes, UA: NEGATIVE
Nitrite: NEGATIVE
Protein, ur: NEGATIVE mg/dL
Specific Gravity, Urine: 1.01 (ref 1.005–1.030)
Squamous Epithelial / HPF: NONE SEEN
pH: 7 (ref 5.0–8.0)

## 2016-09-28 LAB — COMPREHENSIVE METABOLIC PANEL WITH GFR
ALT: 16 U/L (ref 14–54)
AST: 26 U/L (ref 15–41)
Albumin: 4.4 g/dL (ref 3.5–5.0)
Alkaline Phosphatase: 46 U/L (ref 38–126)
Anion gap: 9 (ref 5–15)
BUN: 12 mg/dL (ref 6–20)
CO2: 30 mmol/L (ref 22–32)
Calcium: 9.9 mg/dL (ref 8.9–10.3)
Chloride: 100 mmol/L — ABNORMAL LOW (ref 101–111)
Creatinine, Ser: 0.66 mg/dL (ref 0.44–1.00)
GFR calc Af Amer: >60 mL/min
GFR calc non Af Amer: >60 mL/min
Glucose, Bld: 89 mg/dL (ref 65–99)
Potassium: 3.5 mmol/L (ref 3.5–5.1)
Sodium: 139 mmol/L (ref 135–145)
Total Bilirubin: 1.3 mg/dL — ABNORMAL HIGH (ref 0.3–1.2)
Total Protein: 8 g/dL (ref 6.5–8.1)

## 2016-09-28 LAB — INFLUENZA PANEL BY PCR (TYPE A & B)
INFLAPCR: NEGATIVE
Influenza B By PCR: POSITIVE — AB

## 2016-09-28 LAB — I-STAT CG4 LACTIC ACID, ED
LACTIC ACID, VENOUS: 1.76 mmol/L (ref 0.5–1.9)
Lactic Acid, Venous: 1.54 mmol/L (ref 0.5–1.9)

## 2016-09-28 MED ORDER — DEXTROSE 5 % IV SOLN: 500.0000 mg | INTRAVENOUS | Status: DC

## 2016-09-28 MED ORDER — LISINOPRIL 20 MG PO TABS
40.0000 mg | ORAL_TABLET | Freq: Every day | ORAL | Status: DC
  Administered 2016-09-28 – 2016-10-02 (×5): 40 mg via ORAL
  Filled 2016-09-28: qty 2
  Filled 2016-09-28: qty 4
  Filled 2016-09-28 (×2): qty 2
  Filled 2016-09-28: qty 4

## 2016-09-28 MED ORDER — ENOXAPARIN SODIUM 40 MG/0.4ML ~~LOC~~ SOLN
40.0000 mg | Freq: Every day | SUBCUTANEOUS | Status: DC
  Administered 2016-09-28: 40 mg via SUBCUTANEOUS
  Filled 2016-09-28: qty 0.4

## 2016-09-28 MED ORDER — ACETAMINOPHEN 325 MG PO TABS
650.0000 mg | ORAL_TABLET | Freq: Once | ORAL | Status: AC
  Administered 2016-09-28: 650 mg via ORAL
  Filled 2016-09-28: qty 2

## 2016-09-28 MED ORDER — IOPAMIDOL (ISOVUE-300) INJECTION 61%
INTRAVENOUS | Status: AC
  Filled 2016-09-28: qty 75

## 2016-09-28 MED ORDER — PANTOPRAZOLE SODIUM 40 MG PO TBEC
40.0000 mg | DELAYED_RELEASE_TABLET | Freq: Every day | ORAL | Status: DC
  Administered 2016-09-29 – 2016-10-02 (×4): 40 mg via ORAL
  Filled 2016-09-28 (×4): qty 1

## 2016-09-28 MED ORDER — SODIUM CHLORIDE 0.9 % IV SOLN
INTRAVENOUS | Status: AC
  Administered 2016-09-28 – 2016-09-29 (×2): via INTRAVENOUS

## 2016-09-28 MED ORDER — ONDANSETRON HCL 4 MG/2ML IJ SOLN
4.0000 mg | Freq: Four times a day (QID) | INTRAMUSCULAR | Status: DC | PRN
Start: 2016-09-28 — End: 2016-10-02

## 2016-09-28 MED ORDER — ATORVASTATIN CALCIUM 20 MG PO TABS
20.0000 mg | ORAL_TABLET | Freq: Every day | ORAL | Status: DC
  Administered 2016-09-28 – 2016-10-02 (×5): 20 mg via ORAL
  Filled 2016-09-28 (×3): qty 2
  Filled 2016-09-28 (×2): qty 1

## 2016-09-28 MED ORDER — ACETAMINOPHEN 325 MG PO TABS
650.0000 mg | ORAL_TABLET | Freq: Four times a day (QID) | ORAL | Status: DC | PRN
  Administered 2016-10-01 – 2016-10-02 (×2): 650 mg via ORAL
  Filled 2016-09-28 (×2): qty 2

## 2016-09-28 MED ORDER — DEXTROSE 5 % IV SOLN
500.0000 mg | Freq: Once | INTRAVENOUS | Status: AC
  Administered 2016-09-28: 500 mg via INTRAVENOUS
  Filled 2016-09-28: qty 500

## 2016-09-28 MED ORDER — PREDNISOLONE ACETATE 1 % OP SUSP
1.0000 [drp] | OPHTHALMIC | Status: DC
  Administered 2016-09-28 – 2016-10-01 (×18): 1 [drp] via OPHTHALMIC
  Filled 2016-09-28: qty 1

## 2016-09-28 MED ORDER — HYDRALAZINE HCL 20 MG/ML IJ SOLN: 20.0000 mg | Freq: Four times a day (QID) | INTRAMUSCULAR | Status: DC | PRN

## 2016-09-28 MED ORDER — ASPIRIN EC 325 MG PO TBEC: 325.0000 mg | DELAYED_RELEASE_TABLET | Freq: Every day | ORAL | Status: DC

## 2016-09-28 MED ORDER — FENTANYL 75 MCG/HR TD PT72
75.0000 ug | MEDICATED_PATCH | TRANSDERMAL | Status: DC
  Administered 2016-09-28 – 2016-10-01 (×2): 75 ug via TRANSDERMAL
  Filled 2016-09-28 (×2): qty 1

## 2016-09-28 MED ORDER — IOPAMIDOL (ISOVUE-300) INJECTION 61%
75.0000 mL | Freq: Once | INTRAVENOUS | Status: AC | PRN
  Administered 2016-09-28: 75 mL via INTRAVENOUS

## 2016-09-28 MED ORDER — ARIPIPRAZOLE 5 MG PO TABS
5.0000 mg | ORAL_TABLET | Freq: Every day | ORAL | Status: DC
  Administered 2016-09-29 – 2016-10-02 (×4): 5 mg via ORAL
  Filled 2016-09-28 (×4): qty 1

## 2016-09-28 MED ORDER — DEXTROSE 5 % IV SOLN
1.0000 g | Freq: Once | INTRAVENOUS | Status: AC
  Administered 2016-09-28: 1 g via INTRAVENOUS
  Filled 2016-09-28: qty 10

## 2016-09-28 MED ORDER — OSELTAMIVIR PHOSPHATE 75 MG PO CAPS
75.0000 mg | ORAL_CAPSULE | Freq: Two times a day (BID) | ORAL | Status: DC
  Administered 2016-09-28 – 2016-10-02 (×8): 75 mg via ORAL
  Filled 2016-09-28 (×9): qty 1

## 2016-09-28 MED ORDER — BUSPIRONE HCL 10 MG PO TABS
30.0000 mg | ORAL_TABLET | Freq: Two times a day (BID) | ORAL | Status: DC
  Administered 2016-09-28 – 2016-10-02 (×8): 30 mg via ORAL
  Filled 2016-09-28: qty 3
  Filled 2016-09-28: qty 6
  Filled 2016-09-28: qty 3
  Filled 2016-09-28: qty 6
  Filled 2016-09-28: qty 3
  Filled 2016-09-28: qty 6
  Filled 2016-09-28 (×2): qty 3

## 2016-09-28 MED ORDER — ACETAMINOPHEN 650 MG RE SUPP: 650.0000 mg | Freq: Four times a day (QID) | RECTAL | Status: DC | PRN

## 2016-09-28 MED ORDER — DONEPEZIL HCL 10 MG PO TABS
10.0000 mg | ORAL_TABLET | Freq: Every day | ORAL | Status: DC
  Administered 2016-09-28 – 2016-10-01 (×4): 10 mg via ORAL
  Filled 2016-09-28: qty 1
  Filled 2016-09-28: qty 2
  Filled 2016-09-28 (×2): qty 1

## 2016-09-28 MED ORDER — SODIUM CHLORIDE 0.9 % IJ SOLN
INTRAMUSCULAR | Status: AC
  Filled 2016-09-28: qty 50

## 2016-09-28 MED ORDER — VENLAFAXINE HCL ER 75 MG PO CP24
75.0000 mg | ORAL_CAPSULE | Freq: Every day | ORAL | Status: DC
  Administered 2016-09-29 – 2016-10-02 (×4): 75 mg via ORAL
  Filled 2016-09-28 (×4): qty 1

## 2016-09-28 MED ORDER — DEXTROSE 5 % IV SOLN: 1.0000 g | INTRAVENOUS | Status: DC

## 2016-09-28 MED ORDER — IPRATROPIUM-ALBUTEROL 0.5-2.5 (3) MG/3ML IN SOLN: 3.0000 mL | RESPIRATORY_TRACT | Status: DC | PRN

## 2016-09-28 NOTE — H&P (Signed)
History and Physical    KRYSTEN VERONICA WUJ:[id number] DOB: 1941-06-29 DOA: 09/28/2016  PCP: Dorrene German, MD  Gaylord ShihMagnus Ivan  Patient coming from: Home via EMS  Chief Complaint: Altered mental status  HPI: [name] is a 76 y.o. woman with a history of HTN, HLD, OSA on CPAP qHS, asthma, chronic pain, and depression who lives with her niece.  She was reportedly in her baseline state of health until two days ago.  Since then, she has demonstrated increased confusion, lethargy, and fatigue. She has complained of pain in her left knee, and the family has noted increased swelling and warmth there.  She has had a dry cough and increased shortness of breath, but no wheezing.  She had been exposed to multiple family members with the flu (niece, son tested positive and received treatment; her daughter-in-law has had flu-like symptoms).  No reported headache, chest pain, or abdominal pain.  No nausea or vomiting.  No documented fever until today; though she has been warm to touch.  She has had urinary incontinence in the past 24 hours, which is atypical for her.  ED Course: Temp 101.2 here in the ED.  Normal WBC count.  Normal renal function.  Normal lactic acid level.  Flu screen pending.  Head CT does not show acute changes.  Chest xray showing a right hilar mass; further evaluation needed with chest CT.  No known history of contrast allergy.  No shellfish allergy.  Hospitalist asked to admit.  Review of Systems: As per HPI otherwise 10 point review of systems negative.    Past Medical History:  Diagnosis Date  . Arthritis   . Asthma   . Chronic back pain   . Depression   . Hyperlipemia   . Hypertension   . Scoliosis   . Sleep apnea    cpap - settingsi at 2     Past Surgical History:  Procedure Laterality Date  . ARTHROSCOPIC REPAIR ACL  02/03/2004  . OOPHORECTOMY  1970  . SPINE SURGERY  03/31/2006  . TOTAL KNEE ARTHROPLASTY Left 05/17/2016   Procedure: LEFT TOTAL KNEE  ARTHROPLASTY;  Surgeon: Kathryne Hitch, MD;  Location: [facility]S;  Service: Orthopedics;  Laterality: Left;  Adductor Block; Spinal to General  . TUBAL LIGATION  1983     reports that she quit smoking about 43 years ago. Her smoking use included Cigarettes. She has a 10.00 pack-year smoking history. She has never used smokeless tobacco. She reports that she does not drink alcohol or use drugs. She is a widow.  She has two surviving children.  Her son is her next of kin; POA.  She lives with her niece.  Allergies  Allergen Reactions  . Codeine Itching    Family History  Problem Relation Age of Onset  . Other Father     kidney problems     Prior to Admission medications   Medication Sig Start Date End Date Taking? Authorizing Provider  acetaminophen (TYLENOL) 500 MG tablet Take 500-1,000 mg by mouth every 6 (six) hours as needed for mild pain, moderate pain or headache.    Yes Historical Provider, MD  albuterol (PROVENTIL HFA;VENTOLIN HFA) 108 (90 Base) MCG/ACT inhaler Inhale 1-2 puffs into the lungs every 4 (four) hours as needed for wheezing or shortness of breath.   Yes Historical Provider, MD  alendronate (FOSAMAX) 70 MG tablet Take 70 mg by mouth every Sunday. Take with a full glass of water on an empty stomach.  Yes Historical Provider, MD  ARIPiprazole (ABILIFY) 5 MG tablet Take 5 mg by mouth daily.    Yes Historical Provider, MD  aspirin EC 325 MG tablet Take 325 mg by mouth daily.   Yes Historical Provider, MD  atorvastatin (LIPITOR) 20 MG tablet Take 20 mg by mouth daily.    Yes Historical Provider, MD  busPIRone (BUSPAR) 30 MG tablet Take 30 mg by mouth 2 (two) times daily.   Yes Historical Provider, MD  desvenlafaxine (PRISTIQ) 50 MG 24 hr tablet Take 50 mg by mouth daily.    Yes Historical Provider, MD  donepezil (ARICEPT) 10 MG tablet Take 10 mg by mouth at bedtime.    Yes Historical Provider, MD  fentaNYL (DURAGESIC - DOSED MCG/HR) 75 MCG/HR Place 75 mcg onto the skin  every 3 (three) days.    Yes Historical Provider, MD  fexofenadine (ALLEGRA) 180 MG tablet Take 180 mg by mouth daily as needed for allergies.    Yes Historical Provider, MD  furosemide (LASIX) 40 MG tablet Take 40 mg by mouth daily as needed for edema.    Yes Historical Provider, MD  hydrochlorothiazide (HYDRODIURIL) 25 MG tablet Take 25 mg by mouth daily.   Yes Historical Provider, MD  lisinopril (PRINIVIL,ZESTRIL) 40 MG tablet Take 40 mg by mouth daily.    Yes Historical Provider, MD  meclizine (ANTIVERT) 25 MG tablet Take 25 mg by mouth every 6 (six) hours as needed for dizziness.    Yes Historical Provider, MD  methocarbamol (ROBAXIN) 500 MG tablet Take 1 tablet (500 mg total) by mouth every 6 (six) hours as needed for muscle spasms. 05/19/16  Yes Kathryne Hitch, MD  Multiple Vitamin (MULTIVITAMIN WITH MINERALS) TABS tablet Take 1 tablet by mouth daily.    Yes Historical Provider, MD  Olopatadine HCl (PATADAY) 0.2 % SOLN Place 1 drop into both eyes daily as needed (for allergies).   Yes Historical Provider, MD  omeprazole (PRILOSEC) 20 MG capsule Take 20 mg by mouth daily.   Yes Historical Provider, MD  oxyCODONE (ROXICODONE) 15 MG immediate release tablet Take 15 mg by mouth 3 (three) times daily as needed for pain.   Yes Historical Provider, MD  potassium chloride (KLOR-CON 10) 10 MEQ tablet Take 10 mEq by mouth daily as needed (when taking Lasix).    Yes Historical Provider, MD  prednisoLONE acetate (PRED FORTE) 1 % ophthalmic suspension Place 1 drop into both eyes every 2 (two) hours while awake.    Yes Historical Provider, MD    Physical Exam: Vitals:   09/28/16 1641 09/28/16 1643 09/28/16 1751 09/28/16 1800  BP: 192/90  176/88 177/79  Pulse: 65  63 64  Resp: 16  15 18   Temp: 100.4 F (38 C)  101.2 F (38.4 C)   TempSrc: Oral  Oral   SpO2: 92%  93% 91%  Weight:  102.1 kg (225 lb)    Height:  5\' 4"  (1.626 m)        Constitutional: NAD, calm, comfortable, NONtoxic  appearing but clearly disoriented. Vitals:   09/28/16 1641 09/28/16 1643 09/28/16 1751 09/28/16 1800  BP: 192/90  176/88 177/79  Pulse: 65  63 64  Resp: 16  15 18   Temp: 100.4 F (38 C)  101.2 F (38.4 C)   TempSrc: Oral  Oral   SpO2: 92%  93% 91%  Weight:  102.1 kg (225 lb)    Height:  5\' 4"  (1.626 m)     Eyes: pupils are pinpoint and  sluggish, lids and conjunctivae normal ENMT: Tongue appears moist and she appears to have normal dentition across the front.  Unable to completely assess because the patient could not cooperate with the exam. Neck: normal appearance, supple, no masses Respiratory: clear to auscultation on the left.  Diminished with crackles on the right.  No wheezing.  Normal respiratory effort. No accessory muscle use.  Cardiovascular: Normal rate, regular rhythm, no murmurs / rubs / gallops. Asymmetric edema of the left knee and leg.  2+ pedal pulses. GI: abdomen is soft and compressible.  No distention.  No tenderness.  No masses palpated.  Bowel sounds are present. Musculoskeletal:  Left knee is swollen.  Moves all four extremities equally.  No contractures. Normal muscle tone.  Skin: no rashes, warm and dry Neurologic: Unable to completely assess because patient cannot comply with the exam.  She seems to have generalized weakness; one side does not appear weaker than the other.  Tongue is midline.  No nystagmus. Psychiatric: Disoriented.  Judgment and insight impaired.    Labs on Admission: I have personally reviewed following labs and imaging studies  CBC:  Recent Labs Lab 09/28/16 1700  WBC 7.6  HGB 14.1  HCT 42.7  MCV 93.8  PLT 156   Basic Metabolic Panel:  Recent Labs Lab 09/28/16 1700  NA 139  K 3.5  CL 100*  CO2 30  GLUCOSE 89  BUN 12  CREATININE 0.66  CALCIUM 9.9   GFR: Estimated Creatinine Clearance: 69.6 mL/min (by C-G formula based on SCr of 0.66 mg/dL). Liver Function Tests:  Recent Labs Lab 09/28/16 1700  AST 26  ALT 16    ALKPHOS 46  BILITOT 1.3*  PROT 8.0  ALBUMIN 4.4   Urine analysis:    Component Value Date/Time   COLORURINE YELLOW 09/28/2016 1700   APPEARANCEUR CLEAR 09/28/2016 1700   LABSPEC 1.010 09/28/2016 1700   PHURINE 7.0 09/28/2016 1700   GLUCOSEU NEGATIVE 09/28/2016 1700   HGBUR MODERATE (A) 09/28/2016 1700   BILIRUBINUR NEGATIVE 09/28/2016 1700   KETONESUR NEGATIVE 09/28/2016 1700   PROTEINUR NEGATIVE 09/28/2016 1700   UROBILINOGEN 1.0 02/14/2012 1054   NITRITE NEGATIVE 09/28/2016 1700   LEUKOCYTESUR NEGATIVE 09/28/2016 1700   Sepsis Labs:  Lactic acid level 1.76  Radiological Exams on Admission: Dg Chest 2 View  Result Date: 09/28/2016 CLINICAL DATA:  76 year old female with history of sepsis. Lethargy and fever. Knee swelling and pain since yesterday. Former smoker. EXAM: CHEST  2 VIEW COMPARISON:  Chest x-ray a 02/29/2016. FINDINGS: Lung volumes are low. There appears to be a right sided hilar mass on the frontal projection, although cardiomediastinal contours are grossly distorted by patient's rotation to the right. This could conceivably reflect central airspace consolidation, but this is not favored. Increased density over the hilar regions is noted on the lateral projection as well. No pleural effusions. No evidence of pulmonary edema. Heart size appears mildly enlarged. Aortic atherosclerosis. IMPRESSION: 1. The appearance of the chest is concerning for potential right-sided hilar malignancy. Conceivably, these findings could be related to a central area of airspace consolidation in the right lung, but this is not favored. Further evaluation with contrast enhanced chest CT is recommended at this time to better evaluate these findings. 2. Aortic atherosclerosis. 3. Cardiomegaly. Electronically Signed   By: Trudie Reed M.D.   On: 09/28/2016 17:48   Ct Head Wo Contrast  Result Date: 09/28/2016 CLINICAL DATA:  Acute onset of generalized weakness, fever, fatigue and knee pain.  Cough. Altered  mental status. Initial encounter. EXAM: CT HEAD WITHOUT CONTRAST TECHNIQUE: Contiguous axial images were obtained from the base of the skull through the vertex without intravenous contrast. COMPARISON:  CT of the head performed 07/18/2016 FINDINGS: Brain: No evidence of acute infarction, hemorrhage, hydrocephalus, extra-axial collection or mass lesion/mass effect. Prominence of the ventricles and sulci reflects mild to moderate cortical volume loss. Mild cerebellar atrophy is noted. Scattered periventricular white matter change likely reflects small vessel ischemic microangiopathy. The brainstem and fourth ventricle are within normal limits. The basal ganglia are unremarkable in appearance. The cerebral hemispheres demonstrate grossly normal gray-white differentiation. No mass effect or midline shift is seen. Vascular: No hyperdense vessel or unexpected calcification. Skull: There is no evidence of fracture; visualized osseous structures are unremarkable in appearance. Sinuses/Orbits: The visualized portions of the orbits are within normal limits. The paranasal sinuses and mastoid air cells are well-aerated. Other: No significant soft tissue abnormalities are seen. IMPRESSION: 1. No acute intracranial pathology seen on CT. 2. Mild to moderate cortical volume loss and scattered small vessel ischemic microangiopathy. Electronically Signed   By: Roanna Raider M.D.   On: 09/28/2016 18:38    Assessment/Plan Principal Problem:   Acute encephalopathy Active Problems:   Status post total left knee replacement   Left knee pain   Left leg swelling   Hilar mass   OSA on CPAP   HTN (hypertension)   HLD (hyperlipidemia)   Chronic pain      Acute encephalopathy, likely secondary to infection at this point, with fever to 101.2 (but normal lactic acid).  She has had known sick contacts who have tested positive for the flu.  She also has a right hilar mass, and a post-obstructive or atypical  pneumonia cannot be ruled out as well. --Empiric coverage for CAP with IV Rocephin and azithromycin for now. --Blood cultures pending --Flu screen pending. --Check a TSH --CT scan of her chest with contrast in the AM to better assess right hilar process --Hydrate with NS at 100cc/hr for renal protection  Left knee and leg pain with new swelling --Xray left knee, four views --Venous doppler left leg to rule out DVT --Check sed rate and CRP --Ortho consult deferred for now (low index of suspicion for septic arthritis at this time)  Chronic pain --Continue fentanyl patch (due to be changed tomorrow) but will minimize oral narcotics until her mental status improves  HTN --Lisinopril --IV hydralazine prn --HOLD diuretics for now since we are hydrating for CT scan of her chest with contrast  HLD --Statin  OSA --CPAP per home regimen  Mood disorder NOS/depression --Abilify, Buspar, Pristiq, Aricept  GERD --PPI  DVT prophylaxis: Lovenox Code Status: FULL Family Communication: Son and daughter-in-law present in the ED at time of admission. Disposition Plan: To be determined. Consults called: NONE Admission status: Place in observation with telemetry monitoring.   TIME SPENT: 70 minutes   Jerene Bears MD [facility] Pager [phone]  If 7PM-7AM, please contact night-coverage [email or web address] Password [facility]  09/28/2016, 7:47 PM

## 2016-09-28 NOTE — ED Triage Notes (Signed)
Patient presents from home today for weakness, fever, fatigue, knee pain, and cough. Family had reported to ems urinary frequency.

## 2016-09-28 NOTE — ED Notes (Signed)
Bed: WA06 Expected date: 09/28/16 Expected time: 4:16 PM Means of arrival: Ambulance Comments: Fever, frequent urination

## 2016-09-28 NOTE — ED Notes (Signed)
ED Provider/ hospitalist at bedside. 

## 2016-09-28 NOTE — ED Provider Notes (Signed)
[facility]-EMERGENCY DEPT Provider Note   CSN: [id number] Arrival date & time: 09/28/16  1620     History   Chief Complaint No chief complaint on file.   HPI [name] is a 76 y.o. female.  HPI  76 y.o. female with a hx of HTN, HLD, presents to the Emergency Department today via EMS due to fever and polyuria. Pt history limited to somewhat altered mental status. What patient could gather was that she has been having lower abdominal pain x 1 day. Family notified EMS due to lethargy. Noted increase urine output. No fevers at home. No N/V/D. No CP/SOB. Pt able to answer questions appropriately, but has delayed answers.   Level V Caveat: AMS  Past Medical History:  Diagnosis Date  . Arthritis   . Asthma   . Chronic back pain   . Depression   . Hyperlipemia   . Hypertension   . Scoliosis   . Sleep apnea    cpap - settingsi at 3     Patient Active Problem List   Diagnosis Date Noted  . Cellulitis of left leg 06/14/2016  . Osteoarthritis of left knee 05/17/2016  . 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 ARTHROSCOPIC REPAIR ACL  02/03/2004  . OOPHORECTOMY  1970  . SPINE SURGERY  03/31/2006  . TOTAL KNEE ARTHROPLASTY Left 05/17/2016   Procedure: LEFT TOTAL KNEE ARTHROPLASTY;  Surgeon: Kathryne Hitch, MD;  Location: [facility]S;  Service: Orthopedics;  Laterality: Left;  Adductor Block; Spinal to General  . TUBAL LIGATION  1983    OB History    No data available       Home Medications    Prior to Admission medications   Medication Sig Start Date End Date Taking? Authorizing Provider  acetaminophen (TYLENOL) 500 MG tablet Take 500 mg by mouth every 6 (six) hours as needed for mild pain, moderate pain or headache.    Historical Provider, MD  alendronate (FOSAMAX) 70 MG tablet Take 70 mg by mouth every Sunday. Take with a full glass of water on an empty stomach.    Historical Provider, MD    ARIPiprazole (ABILIFY) 5 MG tablet Take 5 mg by mouth every morning.  04/01/16   Historical Provider, MD  atorvastatin (LIPITOR) 20 MG tablet Take 20 mg by mouth every morning.     Historical Provider, MD  busPIRone (BUSPAR) 30 MG tablet Take 30 mg by mouth 2 (two) times daily. 04/26/16   Historical Provider, MD  desvenlafaxine (PRISTIQ) 50 MG 24 hr tablet Take 50 mg by mouth every morning.     Historical Provider, MD  donepezil (ARICEPT) 10 MG tablet Take 10 mg by mouth every evening.     Historical Provider, MD  doxycycline (VIBRAMYCIN) 100 MG capsule Take 1 capsule (100 mg total) by mouth 2 (two) times daily. 06/18/16   Kathryne Hitch, MD  fentaNYL (DURAGESIC - DOSED MCG/HR) 75 MCG/HR Place 75 mcg onto the skin every 3 (three) days.  07/17/15   Historical Provider, MD  fexofenadine (ALLEGRA) 180 MG tablet Take 180 mg by mouth daily as needed for allergies.     Historical Provider, MD  furosemide (LASIX) 40 MG tablet Take 40 mg by mouth daily as needed for fluid.     Historical Provider, MD  hydrochlorothiazide (HYDRODIURIL) 12.5 MG tablet Take 12.5 mg by mouth daily. 07/01/16   Historical Provider, MD  lisinopril (PRINIVIL,ZESTRIL) 40 MG tablet  Take 40 mg by mouth daily.     Historical Provider, MD  meclizine (ANTIVERT) 25 MG tablet Take 25 mg by mouth every 6 (six) hours as needed for dizziness.     Historical Provider, MD  methocarbamol (ROBAXIN) 500 MG tablet Take 1 tablet (500 mg total) by mouth every 6 (six) hours as needed for muscle spasms. 05/19/16   Kathryne Hitch, MD  Multiple Vitamin (MULTIVITAMIN WITH MINERALS) TABS tablet Take 1 tablet by mouth every morning.     Historical Provider, MD  Olopatadine HCl 0.2 % SOLN instill 1 drop into both eyes once daily if needed 07/26/16   Historical Provider, MD  omeprazole (PRILOSEC) 20 MG capsule Take 20 mg by mouth daily. 07/26/16   Historical Provider, MD  oxyCODONE (ROXICODONE) 15 MG immediate release tablet Take 1 tablet (15 mg  total) by mouth every 3 (three) hours as needed for pain. Patient taking differently: Take 15 mg by mouth every 8 (eight) hours as needed for pain.  05/19/16   Kathryne Hitch, MD  potassium chloride (KLOR-CON 10) 10 MEQ tablet Take 10 mEq by mouth 2 (two) times daily as needed (for swelling).     Historical Provider, MD  prednisoLONE acetate (PRED FORTE) 1 % ophthalmic suspension Place 1 drop into both eyes 3 (three) times daily as needed (for dry eyes).     Historical Provider, MD  PROAIR HFA 108 (90 BASE) MCG/ACT inhaler Inhale 2 puffs into the lungs every 4 (four) hours as needed for wheezing or shortness of breath.  04/27/14   Historical Provider, MD  RA ASPIRIN 325 MG tablet Take 325 mg by mouth daily. 07/26/16   Historical Provider, MD    Family History Family History  Problem Relation Age of Onset  . Other Father     kidney problems    Social History Social History  Substance Use Topics  . Smoking status: Former Smoker    Packs/day: 1.00    Years: 10.00    Types: Cigarettes    Quit date: 08/12/1973  . Smokeless tobacco: Never Used  . Alcohol use No     Allergies   Codeine   Review of Systems Review of Systems ROS reviewed and all are negative for acute change except as noted in the HPI.  Physical Exam Updated Vital Signs BP 176/88 (BP Location: Left Arm)   Pulse 63   Temp 101.2 F (38.4 C) (Oral)   Resp 15   Ht 5\' 4"  (1.626 m)   Wt 102.1 kg   SpO2 93%   BMI 38.62 kg/m   Physical Exam  Constitutional: She appears well-developed and well-nourished. No distress.  HENT:  Head: Normocephalic and atraumatic.  Right Ear: Tympanic membrane, external ear and ear canal normal.  Left Ear: Tympanic membrane, external ear and ear canal normal.  Nose: Nose normal.  Mouth/Throat: Uvula is midline, oropharynx is clear and moist and mucous membranes are normal. No trismus in the jaw. No oropharyngeal exudate, posterior oropharyngeal erythema or tonsillar abscesses.    Eyes: EOM are normal. Pupils are equal, round, and reactive to light.  Neck: Normal range of motion. Neck supple. No tracheal deviation present.  Cardiovascular: Normal rate, regular rhythm, S1 normal, S2 normal, normal heart sounds, intact distal pulses and normal pulses.   Pulmonary/Chest: Effort normal and breath sounds normal. No respiratory distress. She has no decreased breath sounds. She has no wheezes. She has no rhonchi. She has no rales.  Abdominal: Normal appearance and bowel sounds  are normal. There is no tenderness. There is no rigidity, no rebound, no guarding, no CVA tenderness, no tenderness at McBurney's point and negative Murphy's sign.  Musculoskeletal: Normal range of motion.  Left Knee with noted Total Knee Replacement scarring. Warm to touch, but no visible erythema or swelling. ROM intact.   Neurological: She is alert. She is disoriented (to time).  Pt answers questions appropriately but has delay in answering   Skin: Skin is warm and dry.  Psychiatric: She has a normal mood and affect. Her speech is normal and behavior is normal. Thought content normal.   ED Treatments / Results  Labs (all labs ordered are listed, but only abnormal results are displayed) Labs Reviewed  COMPREHENSIVE METABOLIC PANEL - Abnormal; Notable for the following:       Result Value   Chloride 100 (*)    Total Bilirubin 1.3 (*)    All other components within normal limits  URINALYSIS, ROUTINE W REFLEX MICROSCOPIC - Abnormal; Notable for the following:    Hgb urine dipstick MODERATE (*)    Bacteria, UA RARE (*)    All other components within normal limits  CBC  INFLUENZA PANEL BY PCR (TYPE A & B)  I-STAT CG4 LACTIC ACID, ED   EKG  EKG Interpretation None      Radiology Dg Chest 2 View  Result Date: 09/28/2016 CLINICAL DATA:  76 year old female with history of sepsis. Lethargy and fever. Knee swelling and pain since yesterday. Former smoker. EXAM: CHEST  2 VIEW COMPARISON:  Chest  x-ray a 02/29/2016. FINDINGS: Lung volumes are low. There appears to be a right sided hilar mass on the frontal projection, although cardiomediastinal contours are grossly distorted by patient's rotation to the right. This could conceivably reflect central airspace consolidation, but this is not favored. Increased density over the hilar regions is noted on the lateral projection as well. No pleural effusions. No evidence of pulmonary edema. Heart size appears mildly enlarged. Aortic atherosclerosis. IMPRESSION: 1. The appearance of the chest is concerning for potential right-sided hilar malignancy. Conceivably, these findings could be related to a central area of airspace consolidation in the right lung, but this is not favored. Further evaluation with contrast enhanced chest CT is recommended at this time to better evaluate these findings. 2. Aortic atherosclerosis. 3. Cardiomegaly. Electronically Signed   By: Trudie Reed M.D.   On: 09/28/2016 17:48   Ct Head Wo Contrast  Result Date: 09/28/2016 CLINICAL DATA:  Acute onset of generalized weakness, fever, fatigue and knee pain. Cough. Altered mental status. Initial encounter. EXAM: CT HEAD WITHOUT CONTRAST TECHNIQUE: Contiguous axial images were obtained from the base of the skull through the vertex without intravenous contrast. COMPARISON:  CT of the head performed 07/18/2016 FINDINGS: Brain: No evidence of acute infarction, hemorrhage, hydrocephalus, extra-axial collection or mass lesion/mass effect. Prominence of the ventricles and sulci reflects mild to moderate cortical volume loss. Mild cerebellar atrophy is noted. Scattered periventricular white matter change likely reflects small vessel ischemic microangiopathy. The brainstem and fourth ventricle are within normal limits. The basal ganglia are unremarkable in appearance. The cerebral hemispheres demonstrate grossly normal gray-white differentiation. No mass effect or midline shift is seen. Vascular:  No hyperdense vessel or unexpected calcification. Skull: There is no evidence of fracture; visualized osseous structures are unremarkable in appearance. Sinuses/Orbits: The visualized portions of the orbits are within normal limits. The paranasal sinuses and mastoid air cells are well-aerated. Other: No significant soft tissue abnormalities are seen. IMPRESSION: 1. No acute  intracranial pathology seen on CT. 2. Mild to moderate cortical volume loss and scattered small vessel ischemic microangiopathy. Electronically Signed   By: Roanna Raider M.D.   On: 09/28/2016 18:38    Procedures Procedures (including critical care time)  Medications Ordered in ED Medications  acetaminophen (TYLENOL) tablet 650 mg (not administered)   Initial Impression / Assessment and Plan / ED Course  I have reviewed the triage vital signs and the nursing notes.  Pertinent labs & imaging results that were available during my care of the patient were reviewed by me and considered in my medical decision making (see chart for details).  Final Clinical Impressions(s) / ED Diagnoses  {I have reviewed and evaluated the relevant laboratory values.  {I have reviewed the relevant previous healthcare records. {I have reviewed EMS Documentation. {I obtained HPI from historian. {Patient discussed with supervising physician.  ED Course:  Assessment: Pt is a 75yF with hx HTN ,HLD who presents due to polyuria, fever as well as altered mental status. Unsure of baseline. No N/V/D. On exam, pt in Nontoxic/nonseptic appearing. VSS. Temp 100.38F. Lungs CTA. Heart RRR. Abdomen nontender soft. iStat Lactate 1.76. WBC 7.6. CMP unremarkable. UA unremarkable. CXR with possible right sided hilar malignancy. Due to AMS will obtain CT head as well to eval for possible malignancy. During process, temp elevated to 101F. Given Tylenol. Will admit to medicine due to AMS and fever of unknown origin. Low suspicion for sepsis as lactic negative, No  tachycardia, no leukocytosis. Will given ABX with Rocephin and Azithro for possible CAP.       Disposition/Plan:  Admit Pt acknowledges and agrees with plan  Supervising Physician Nira Conn, MD  Final diagnoses:  Altered mental status, unspecified altered mental status type  Fever, unknown origin    New Prescriptions New Prescriptions   No medications on file     Audry Pili, PA-C 09/28/16 1908    Nira Conn, MD 09/29/16 0126    Nira Conn, MD 09/29/16 [phone]

## 2016-09-29 ENCOUNTER — Inpatient Hospital Stay (HOSPITAL_COMMUNITY): Payer: Medicare Other

## 2016-09-29 DIAGNOSIS — I161 Hypertensive emergency: Secondary | ICD-10-CM | POA: Diagnosis present

## 2016-09-29 DIAGNOSIS — J45909 Unspecified asthma, uncomplicated: Secondary | ICD-10-CM | POA: Diagnosis present

## 2016-09-29 DIAGNOSIS — I82432 Acute embolism and thrombosis of left popliteal vein: Secondary | ICD-10-CM | POA: Diagnosis present

## 2016-09-29 DIAGNOSIS — R509 Fever, unspecified: Secondary | ICD-10-CM | POA: Diagnosis present

## 2016-09-29 DIAGNOSIS — M79609 Pain in unspecified limb: Secondary | ICD-10-CM | POA: Diagnosis not present

## 2016-09-29 DIAGNOSIS — I2699 Other pulmonary embolism without acute cor pulmonale: Secondary | ICD-10-CM

## 2016-09-29 DIAGNOSIS — G4733 Obstructive sleep apnea (adult) (pediatric): Secondary | ICD-10-CM | POA: Diagnosis present

## 2016-09-29 DIAGNOSIS — F329 Major depressive disorder, single episode, unspecified: Secondary | ICD-10-CM | POA: Diagnosis present

## 2016-09-29 DIAGNOSIS — R001 Bradycardia, unspecified: Secondary | ICD-10-CM | POA: Diagnosis not present

## 2016-09-29 DIAGNOSIS — G8929 Other chronic pain: Secondary | ICD-10-CM | POA: Diagnosis not present

## 2016-09-29 DIAGNOSIS — I2602 Saddle embolus of pulmonary artery with acute cor pulmonale: Secondary | ICD-10-CM | POA: Diagnosis not present

## 2016-09-29 DIAGNOSIS — Z96652 Presence of left artificial knee joint: Secondary | ICD-10-CM

## 2016-09-29 DIAGNOSIS — J101 Influenza due to other identified influenza virus with other respiratory manifestations: Secondary | ICD-10-CM | POA: Diagnosis present

## 2016-09-29 DIAGNOSIS — I10 Essential (primary) hypertension: Secondary | ICD-10-CM | POA: Diagnosis present

## 2016-09-29 DIAGNOSIS — M25562 Pain in left knee: Secondary | ICD-10-CM | POA: Diagnosis not present

## 2016-09-29 DIAGNOSIS — K219 Gastro-esophageal reflux disease without esophagitis: Secondary | ICD-10-CM | POA: Diagnosis present

## 2016-09-29 DIAGNOSIS — Z79899 Other long term (current) drug therapy: Secondary | ICD-10-CM | POA: Diagnosis not present

## 2016-09-29 DIAGNOSIS — M7989 Other specified soft tissue disorders: Secondary | ICD-10-CM | POA: Diagnosis not present

## 2016-09-29 DIAGNOSIS — Z7982 Long term (current) use of aspirin: Secondary | ICD-10-CM | POA: Diagnosis not present

## 2016-09-29 DIAGNOSIS — E785 Hyperlipidemia, unspecified: Secondary | ICD-10-CM | POA: Diagnosis present

## 2016-09-29 DIAGNOSIS — I712 Thoracic aortic aneurysm, without rupture: Secondary | ICD-10-CM | POA: Diagnosis present

## 2016-09-29 DIAGNOSIS — R918 Other nonspecific abnormal finding of lung field: Secondary | ICD-10-CM | POA: Diagnosis not present

## 2016-09-29 DIAGNOSIS — G894 Chronic pain syndrome: Secondary | ICD-10-CM | POA: Diagnosis present

## 2016-09-29 DIAGNOSIS — Z87891 Personal history of nicotine dependence: Secondary | ICD-10-CM | POA: Diagnosis not present

## 2016-09-29 DIAGNOSIS — G9340 Encephalopathy, unspecified: Secondary | ICD-10-CM | POA: Diagnosis present

## 2016-09-29 DIAGNOSIS — Z9989 Dependence on other enabling machines and devices: Secondary | ICD-10-CM | POA: Diagnosis not present

## 2016-09-29 LAB — CBC
HCT: 38.5 % (ref 36.0–46.0)
Hemoglobin: 12.7 g/dL (ref 12.0–15.0)
MCH: 30.8 pg (ref 26.0–34.0)
MCHC: 33 g/dL (ref 30.0–36.0)
MCV: 93.2 fL (ref 78.0–100.0)
PLATELETS: 143 10*3/uL — AB (ref 150–400)
RBC: 4.13 MIL/uL (ref 3.87–5.11)
RDW: 14.9 % (ref 11.5–15.5)
WBC: 8 10*3/uL (ref 4.0–10.5)

## 2016-09-29 LAB — BASIC METABOLIC PANEL
ANION GAP: 9 (ref 5–15)
BUN: 10 mg/dL (ref 6–20)
CALCIUM: 8.8 mg/dL — AB (ref 8.9–10.3)
CO2: 26 mmol/L (ref 22–32)
Chloride: 104 mmol/L (ref 101–111)
Creatinine, Ser: 0.68 mg/dL (ref 0.44–1.00)
GLUCOSE: 115 mg/dL — AB (ref 65–99)
Potassium: 3.1 mmol/L — ABNORMAL LOW (ref 3.5–5.1)
SODIUM: 139 mmol/L (ref 135–145)

## 2016-09-29 LAB — TROPONIN I: Troponin I: <0.03 ng/mL (ref <0.03)

## 2016-09-29 LAB — MRSA PCR SCREENING: MRSA by PCR: NEGATIVE

## 2016-09-29 LAB — STREP PNEUMONIAE URINARY ANTIGEN: STREP PNEUMO URINARY ANTIGEN: NEGATIVE

## 2016-09-29 LAB — C-REACTIVE PROTEIN: CRP: <0.8 mg/dL (ref <1.0)

## 2016-09-29 LAB — HEPARIN LEVEL (UNFRACTIONATED)
HEPARIN UNFRACTIONATED: 0.76 [IU]/mL — AB (ref 0.30–0.70)
Heparin Unfractionated: 0.56 IU/mL (ref 0.30–0.70)

## 2016-09-29 LAB — ECHOCARDIOGRAM COMPLETE
Height: 62 in
Weight: 3195.79 oz

## 2016-09-29 LAB — TSH: TSH: 0.177 u[IU]/mL — AB (ref 0.350–4.500)

## 2016-09-29 LAB — SEDIMENTATION RATE: Sed Rate: 5 mm/hr (ref 0–22)

## 2016-09-29 MED ORDER — CARVEDILOL 3.125 MG PO TABS
3.1250 mg | ORAL_TABLET | Freq: Two times a day (BID) | ORAL | Status: DC
  Administered 2016-09-29 (×2): 3.125 mg via ORAL
  Filled 2016-09-29 (×3): qty 1

## 2016-09-29 MED ORDER — SENNOSIDES-DOCUSATE SODIUM 8.6-50 MG PO TABS
1.0000 | ORAL_TABLET | Freq: Two times a day (BID) | ORAL | Status: DC
  Administered 2016-09-29 – 2016-10-02 (×7): 1 via ORAL
  Filled 2016-09-29 (×7): qty 1

## 2016-09-29 MED ORDER — ENSURE ENLIVE PO LIQD
237.0000 mL | Freq: Two times a day (BID) | ORAL | Status: DC
  Administered 2016-09-30 – 2016-10-02 (×5): 237 mL via ORAL

## 2016-09-29 MED ORDER — AMLODIPINE BESYLATE 5 MG PO TABS
5.0000 mg | ORAL_TABLET | Freq: Every day | ORAL | Status: DC
  Administered 2016-09-29 – 2016-09-30 (×2): 5 mg via ORAL
  Filled 2016-09-29 (×3): qty 1

## 2016-09-29 MED ORDER — NICARDIPINE HCL IN NACL 20-0.86 MG/200ML-% IV SOLN
3.0000 mg/h | INTRAVENOUS | Status: DC
  Administered 2016-09-29: 5 mg/h via INTRAVENOUS
  Filled 2016-09-29 (×2): qty 200

## 2016-09-29 MED ORDER — HYDRALAZINE HCL 20 MG/ML IJ SOLN
10.0000 mg | INTRAMUSCULAR | Status: DC | PRN
  Administered 2016-09-29: 10 mg via INTRAVENOUS
  Filled 2016-09-29: qty 1

## 2016-09-29 MED ORDER — POTASSIUM CHLORIDE CRYS ER 10 MEQ PO TBCR
30.0000 meq | EXTENDED_RELEASE_TABLET | Freq: Two times a day (BID) | ORAL | Status: AC
  Administered 2016-09-29 – 2016-09-30 (×3): 30 meq via ORAL
  Filled 2016-09-29 (×3): qty 1

## 2016-09-29 MED ORDER — HEPARIN (PORCINE) IN NACL 100-0.45 UNIT/ML-% IJ SOLN
1100.0000 [IU]/h | INTRAMUSCULAR | Status: DC
  Administered 2016-09-29 (×2): 1100 [IU]/h via INTRAVENOUS
  Filled 2016-09-29: qty 250

## 2016-09-29 MED ORDER — HEPARIN BOLUS VIA INFUSION
2000.0000 [IU] | Freq: Once | INTRAVENOUS | Status: AC
  Administered 2016-09-29: 2000 [IU] via INTRAVENOUS
  Filled 2016-09-29: qty 2000

## 2016-09-29 MED ORDER — HEPARIN (PORCINE) IN NACL 100-0.45 UNIT/ML-% IJ SOLN
1200.0000 [IU]/h | INTRAMUSCULAR | Status: DC
  Administered 2016-09-29: 1200 [IU]/h via INTRAVENOUS
  Filled 2016-09-29 (×2): qty 250

## 2016-09-29 NOTE — Evaluation (Signed)
Clinical/Bedside Swallow Evaluation Patient Details  Name: [name] MRN: [id number] Date of Birth: 1940/11/01  Today's Date: 09/29/2016 Time: SLP Start Time (ACUTE ONLY): 1206 SLP Stop Time (ACUTE ONLY): 1221 SLP Time Calculation (min) (ACUTE ONLY): 15 min  Past Medical History:  Past Medical History:  Diagnosis Date  . Arthritis   . Asthma   . Chronic back pain   . Depression   . Hyperlipemia   . Hypertension   . Scoliosis   . Sleep apnea    cpap - settingsi at 3    Past Surgical History:  Past Surgical History:  Procedure Laterality Date  . ARTHROSCOPIC REPAIR ACL  02/03/2004  . OOPHORECTOMY  1970  . SPINE SURGERY  03/31/2006  . TOTAL KNEE ARTHROPLASTY Left 05/17/2016   Procedure: LEFT TOTAL KNEE ARTHROPLASTY;  Surgeon: Kathryne Hitch, MD;  Location: [facility];  Service: Orthopedics;  Laterality: Left;  Adductor Block; Spinal to General  . TUBAL LIGATION  1983   HPI:  [name] a 76 y.o.woman with a history of HTN, HLD, OSA on CPAP qHS, asthma, chronic pain, and depression who lives with her niece. She was reportedly in her baseline state of health until two days ago. CT of chest positive for acute PE with CT evidence of right heart strain. Per MD notes pt with acute encephalopathy, multifactorial causes given influenza, bilateral PE and hypertensive emergency.   Assessment / Plan / Recommendation Clinical Impression  Pts swallow presents within functional limits. No overt signs or symptoms of aspiration with any PO. Pts family at bedside who deny premorbid swallow difficulty. Pt with intermittnet lethargy and required assistance with self feeding. Recommend continue regular thin liquid diet with full supervision and assistance at meals to maximize nutrition and hydration. No further ST needs identified.     Aspiration Risk  Mild aspiration risk    Diet Recommendation   Regular thin liquids   Medication Administration: Whole meds with liquid     Other  Recommendations Oral Care Recommendations: Oral care BID   Follow up Recommendations 24 hour supervision/assistance      Frequency and Duration            Prognosis        Swallow Study   General Date of Onset: 09/28/16 HPI: [name] a 76 y.o.woman with a history of HTN, HLD, OSA on CPAP qHS, asthma, chronic pain, and depression who lives with her niece. She was reportedly in her baseline state of health until two days ago. CT of chest positive for acute PE with CT evidence of right heart strain. Per MD notes pt with acute encephalopathy, multifactorial causes given influenza, bilateral PE and hypertensive emergency. Type of Study: Bedside Swallow Evaluation Previous Swallow Assessment: no hx of dysphagia  Diet Prior to this Study: Regular;Thin liquids Temperature Spikes Noted: Yes Respiratory Status: Room air History of Recent Intubation: No Behavior/Cognition: Lethargic/Drowsy;Requires cueing Oral Cavity Assessment: Within Functional Limits Oral Cavity - Dentition: Adequate natural dentition Self-Feeding Abilities: Needs assist;Needs set up Patient Positioning: Upright in bed Baseline Vocal Quality: Low vocal intensity Volitional Cough: Strong Volitional Swallow: Able to elicit    Oral/Motor/Sensory Function Overall Oral Motor/Sensory Function: Within functional limits   Ice Chips Ice chips: Within functional limits   Thin Liquid Thin Liquid: Within functional limits    Nectar Thick Nectar Thick Liquid: Not tested   Honey Thick Honey Thick Liquid: Not tested   Puree Puree: Within functional limits   Solid  GO   Solid: Within functional limits       Marcene Duos MA, CCC-SLP Acute Care Speech Language Pathologist    Marcene Duos E 09/29/2016,12:30 PM

## 2016-09-29 NOTE — Progress Notes (Signed)
I was called by radiology for emergent results on the chest CT for this patient.  She has acute bilateral PE with evidence of right heart strain on CT.  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  No pneumonia.  No pulmonary mass.  The right hilar fullness on chest xray is her dilated pulmonary artery.  Blood pressures have remained elevated, but she has otherwise been stable on the floor per RN.  "Code PE" was activated as recommended in the CT report.  I am not sure who received this notification.  I called PCCM directly, Dr. Christene Slates, who is willing to see the patient upon arrival to the stepdown unit.  Heparin infusion per pharmacy consult for diagnosis of PE.  Will hold full strength aspirin for now since she needs full anticoagulation. She will need echo in the AM. Will check troponin as well; elevation would be a negative prognostic sign.  Will stop antibiotics now (no pneumonia).  Tamiflu for influenza B.  Will also start nicardipine drip upon arrival to the unit for better blood pressure control in light of incidental finding of thoracic aorta aneurysm.  We will need to discuss with CT surgery in the AM.  Patient's son has been notified by phone of these results and modifications to her plan of care.  Questions have been answered to his apparent satisfaction at this time.  He has been advised that she is critically ill and remains at increased risk for morbidity and mortality.

## 2016-09-29 NOTE — Progress Notes (Signed)
[facility] Physician-Brief Progress Note Patient Name: [name] DOB: 1941-05-16 MRN: [id number]   Date of Service  09/29/2016  HPI/Events of Note  Pt was transferred from floors to SDU.   I discussed the case earlier with Dr. Montez Morita. She wanted [facility] to look at the pt on her arrival to SDU.   Patient comes in with fever, generalized weakness, confusion. She was tested positive for the flu. Incidental finding of multiple segmental pulmonary embolism. Blood pressure is actually elevated. Incidental finding of thoracic aneurysm.   Patient seen, comfortable, arousable. Sleeping at the moment. Blood pressure 204/80, heart rate 60, respiratory rate 18, sats 96% on room air.   eICU Interventions  Heparin drip has been ordered for pulmonary embolism.  No indication for EKOS now.   Hydralazine when necessary. They need to get an IV access prior to Cardene drip. We'll see whether he can hold off on starting Cardene drip with when necessary medicines.  Will need aneurysm evaluated inpt/outpt.   Needs good BP control given aneurysm.   Cont to Rx for flu.   Stay in SDU with [facility] sevice.       Intervention Category Evaluation Type: New Patient Evaluation  Louann Sjogren 09/29/2016, 4:08 AM

## 2016-09-29 NOTE — Progress Notes (Signed)
ANTICOAGULATION CONSULT NOTE - Initial Consult  Pharmacy Consult for Heparin Indication: pulmonary embolus  Allergies  Allergen Reactions  . Codeine Itching    Patient Measurements: Height: 5\' 2"  (157.5 cm) Weight: 199 lb 11.8 oz (90.6 kg) IBW/kg (Calculated) : 50.1 Heparin Dosing Weight:   Vital Signs: Temp: 98.2 F (36.8 C) (02/18 1200) Temp Source: Oral (02/18 1200) BP: 125/55 (02/18 1200) Pulse Rate: 65 (02/18 1200)  Labs:  Recent Labs  09/28/16 1700 09/29/16 0819 09/29/16 1152  HGB 14.1 12.7  --   HCT 42.7 38.5  --   PLT 156 143*  --   HEPARINUNFRC  --   --  0.56  CREATININE 0.66 0.68  --   TROPONINI  --  <0.03  --     Estimated Creatinine Clearance: 62.6 mL/min (by C-G formula based on SCr of 0.68 mg/dL).   Medical History: Past Medical History:  Diagnosis Date  . Arthritis   . Asthma   . Chronic back pain   . Depression   . Hyperlipemia   . Hypertension   . Scoliosis   . Sleep apnea    cpap - settingsi at 3     Medications:  Infusions:  . heparin 1,200 Units/hr (09/29/16 1200)    Assessment: 76 y.o.woman with a history of HTN, HLD, OSA on CPAP qHS, asthma, chronic pain, and depression who lives with her niece. She was reportedly in her baseline state of health until two days ago. Since then, she has demonstrated increased confusion, lethargy, and fatigue.  Pt found to have acute bilateral PE with right heart strain.    Enoxaparin 40mg  sq x1 at 2/17 2126.  Will give ~1/2 usual heparin bolus due to prior enoxaparin.  09/29/2016 HL therapeutic H/H WNL Plts down slightly Scr WNL No bleeding or other issues per RN  Goal of Therapy:  Heparin level 0.3-0.7 units/ml Monitor platelets by anticoagulation protocol: Yes   Plan:  Continue heparin drip at 1200 units/hr Heparin level in 8 hours Daily CBC    Arley Phenix RPh 09/29/2016, 12:50 PM Pager [phone]

## 2016-09-29 NOTE — Progress Notes (Signed)
PROGRESS NOTE    [name]  WUJ:[id number]  DOB: 1941/01/05  DOA: 09/28/2016 PCP: Dorrene German, MD Outpatient Specialists:   Hospital course: [name] is a 76 y.o. woman with a history of HTN, HLD, OSA on CPAP qHS, asthma, chronic pain, and depression who lives with her niece.  She was reportedly in her baseline state of health until two days ago.  Since then, she has demonstrated increased confusion, lethargy, and fatigue.  Pt found to have acute bilateral PE with right heart strain.  Pt had hypertensive emergency and started on cardene drip.  Pt was transferred to SDU and PCCM was consulted.     Assessment & Plan:  Acute encephalopathy, multifactorial causes given influenza, bilateral PE and hypertensive emergency --Resolved now with supportive therapy --Blood cultures pending --Flu B test positive. 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  Pt says that she has been aware of this and it has been followed by her PCP Dr. Concepcion Elk.  Recommend semi-annual imaging followup by CTA or MRA and referral to cardiothoracic surgery if not already obtained.  HLD --Statin  OSA --CPAP per home regimen  Mood disorder NOS/depression --Abilify, Buspar, Pristiq, Aricept  GERD --PPI  DVT prophylaxis: Lovenox Code Status: FULL Family Communication: Son and daughter-in-law present in the ED at time of admission. Disposition Plan: TBD  Consultants:  PCCM  Subjective: Pt is awake, alert, NAD,  oriented  Objective: Vitals:   09/29/16 0615 09/29/16 0630 09/29/16 0645 09/29/16 0700  BP: (!) 151/62 (!) 147/55 (!) 139/50 (!) 142/48  Pulse: 72 77 71 70  Resp: 18 18 (!) 23 19  Temp:      TempSrc:      SpO2: 98% 98% 98% 97%  Weight:      Height:        Intake/Output Summary (Last 24 hours) at 09/29/16 0707 Last data filed at 09/29/16 0700  Gross per 24 hour  Intake             1101 ml  Output              300 ml  Net              801 ml   Filed Weights   09/28/16 1643 09/28/16 2057 09/29/16 0345  Weight: 102.1 kg (225 lb) 90.6 kg (199 lb 11.8 oz) 90.6 kg (199 lb 11.8 oz)    Exam:  General exam: awake, alert, NAD, flat affect Respiratory system: Clear. No increased work of breathing. Cardiovascular system: S1 & S2 heard.  Gastrointestinal system: Abdomen is nondistended, soft and nontender. Normal bowel sounds heard. Central nervous system: Alert and oriented. No focal neurological deficits. Extremities: swollen LLE with cords felt in left calf  Data Reviewed: Basic Metabolic Panel:  Recent Labs Lab 09/28/16 1700  NA 139  K 3.5  CL 100*  CO2 30  GLUCOSE 89  BUN 12  CREATININE 0.66  CALCIUM 9.9   Liver Function Tests:  Recent Labs Lab 09/28/16 1700  AST 26  ALT 16  ALKPHOS  46  BILITOT 1.3*  PROT 8.0  ALBUMIN 4.4   No results for input(s): LIPASE, AMYLASE in the last 168 hours. No results for input(s): AMMONIA in the last 168 hours. CBC:  Recent Labs Lab 09/28/16 1700  WBC 7.6  HGB 14.1  HCT 42.7  MCV 93.8  PLT 156   Cardiac Enzymes: No results for input(s): CKTOTAL, CKMB, CKMBINDEX, TROPONINI in the last 168 hours. CBG (last 3)  No results for input(s): GLUCAP in the last 72 hours. Recent Results (from the past 240 hour(s))  MRSA PCR Screening     Status: None   Collection Time: 09/29/16  3:43 AM  Result Value Ref Range Status   MRSA by PCR NEGATIVE NEGATIVE Final    Comment:        The GeneXpert MRSA Assay (FDA approved for  NASAL specimens only), is one component of a comprehensive MRSA colonization surveillance program. It is not intended to diagnose MRSA infection nor to guide or monitor treatment for MRSA infections.      Studies: Dg Chest 2 View  Result Date: 09/28/2016 CLINICAL DATA:  76 year old female with history of sepsis. Lethargy and fever. Knee swelling and pain since yesterday. Former smoker. EXAM: CHEST  2 VIEW COMPARISON:  Chest x-ray a 02/29/2016. FINDINGS: Lung volumes are low. There appears to be a right sided hilar mass on the frontal projection, although cardiomediastinal contours are grossly distorted by patient's rotation to the right. This could conceivably reflect central airspace consolidation, but this is not favored. Increased density over the hilar regions is noted on the lateral projection as well. No pleural effusions. No evidence of pulmonary edema. Heart size appears mildly enlarged. Aortic atherosclerosis. IMPRESSION: 1. The appearance of the chest is concerning for potential right-sided hilar malignancy. Conceivably, these findings could be related to a central area of airspace consolidation in the right lung, but this is not favored. Further evaluation with contrast enhanced chest CT is recommended at this time to better evaluate these findings. 2. Aortic atherosclerosis. 3. Cardiomegaly. Electronically Signed   By: Trudie Reed M.D.   On: 09/28/2016 17:48   Ct Head Wo Contrast  Result Date: 09/28/2016 CLINICAL DATA:  Acute onset of generalized weakness, fever, fatigue and knee pain. Cough. Altered mental status. Initial encounter. EXAM: CT HEAD WITHOUT CONTRAST TECHNIQUE: Contiguous axial images were obtained from the base of the skull through the vertex without intravenous contrast. COMPARISON:  CT of the head performed 07/18/2016 FINDINGS: Brain: No evidence of acute infarction, hemorrhage, hydrocephalus, extra-axial collection or mass lesion/mass effect. Prominence of the  ventricles and sulci reflects mild to moderate cortical volume loss. Mild cerebellar atrophy is noted. Scattered periventricular white matter change likely reflects small vessel ischemic microangiopathy. The brainstem and fourth ventricle are within normal limits. The basal ganglia are unremarkable in appearance. The cerebral hemispheres demonstrate grossly normal gray-white differentiation. No mass effect or midline shift is seen. Vascular: No hyperdense vessel or unexpected calcification. Skull: There is no evidence of fracture; visualized osseous structures are unremarkable in appearance. Sinuses/Orbits: The visualized portions of the orbits are within normal limits. The paranasal sinuses and mastoid air cells are well-aerated. Other: No significant soft tissue abnormalities are seen. IMPRESSION: 1. No acute intracranial pathology seen on CT. 2. Mild to moderate cortical volume loss and scattered small vessel ischemic microangiopathy. Electronically Signed   By: Roanna Raider M.D.   On: 09/28/2016 18:38   Ct Chest W Contrast  Result Date: 09/29/2016 CLINICAL DATA:  Prominent right hilum,  rule out mass EXAM: CT CHEST WITH CONTRAST TECHNIQUE: Multidetector CT imaging of the chest was performed during intravenous contrast administration. CONTRAST:  75mL ISOVUE-300 IOPAMIDOL (ISOVUE-300) INJECTION 61% COMPARISON:  09/28/2016 CXR FINDINGS: Cardiovascular: There are acute bilateral multilobar pulmonary emboli contributing to prominence of the pulmonary hilum with right heart strain. RV/LV ratio is 1.26. The aorta is aneurysmal the 4.5 cm along the ascending portion with tapering at the aortic arch. No descending thoracic aortic aneurysm. Mediastinum/Nodes: No enlarged mediastinal, hilar, or axillary lymph nodes. Thyroid gland, trachea, and esophagus demonstrate no significant findings. Lungs/Pleura: There is minimal atelectasis posteriorly in the lower lobes. No effusion or pneumothorax. No dominant mass. Upper  Abdomen: No acute abnormality. Musculoskeletal: Chronic superior endplate compression of T8. 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 IMPRESSION: 1. Positive for acute PE with CT evidence of right heart strain (RV/LV Ratio = 1.26) consistent with at least submassive (intermediate risk) PE. The presence of right heart strain has been associated with an increased risk of morbidity and mortality. Please activate Code PE by paging [phone]. Critical Value/emergent results were called by telephone at the time of interpretation on 09/29/2016 at 1:52 am to Dr. Michael Litter , who verbally acknowledged these results. 2. 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 Recommend semi-annual imaging followup by CTA or MRA and referral to cardiothoracic surgery if not already obtained. This recommendation follows 2010 ACCF/AHA/AATS/ACR/ASA/SCA/SCAI/SIR/STS/SVM Guidelines for the Diagnosis and Management of Patients With Thoracic Aortic Disease. Circulation. 2010; 121: W102-V253 3. No pulmonary mass. Electronically Signed   By: Tollie Eth M.D.   On: 09/29/2016 01:52   Dg Knee Complete 4 Views Left  Result Date: 09/28/2016 CLINICAL DATA:  Acute onset of left knee pain.  Initial encounter. EXAM: LEFT KNEE - COMPLETE 4+ VIEW COMPARISON:  None. FINDINGS: The patient's total knee arthroplasty is grossly unremarkable in appearance, without evidence of loosening or fracture. A small knee joint effusion is noted. Edema is noted at Hoffa's fat pad. Mild soft tissue swelling is noted about the knee. IMPRESSION: 1. No evidence of fracture or dislocation. Total knee arthroplasty is unremarkable in appearance, without evidence of loosening. 2. Small knee joint effusion noted. Edema at Hoffa's fat pad. Mild soft tissue swelling about the knee. Electronically Signed   By: Roanna Raider M.D.   On: 09/28/2016 20:15   Scheduled Meds: . ARIPiprazole  5 mg Oral Daily  . atorvastatin  20 mg Oral Daily  . busPIRone  30 mg Oral BID  .  donepezil  10 mg Oral QHS  . fentaNYL  75 mcg Transdermal Q72H  . iopamidol      . lisinopril  40 mg Oral Daily  . oseltamivir  75 mg Oral BID  . pantoprazole  40 mg Oral Daily  . prednisoLONE acetate  1 drop Both Eyes Q2H while awake  . sodium chloride      . venlafaxine XR  75 mg Oral Q breakfast   Continuous Infusions: . sodium chloride 100 mL/hr at 09/29/16 0400  . heparin 1,200 Units/hr (09/29/16 0415)  . niCARDipine 10 mg/hr (09/29/16 0700)    Principal Problem:   Acute encephalopathy Active Problems:   Status post total left knee replacement   Left knee pain   Left leg swelling   Hilar mass   OSA on CPAP   HTN (hypertension)   HLD (hyperlipidemia)   Chronic pain  Critical Care Time spent: 40 mins  Standley Dakins, MD, FAAFP [facility] Pager [phone]  If 7PM-7AM, please  contact night-coverage [email or web address] Password [email or web address] 09/29/2016, 7:07 AM    LOS: 0 days

## 2016-09-29 NOTE — Progress Notes (Signed)
ANTICOAGULATION CONSULT NOTE - Initial Consult  Pharmacy Consult for Heparin Indication: pulmonary embolus  Allergies  Allergen Reactions  . Codeine Itching    Patient Measurements: Height: 5\' 4"  (162.6 cm) Weight: 199 lb 11.8 oz (90.6 kg) IBW/kg (Calculated) : 54.7 Heparin Dosing Weight:   Vital Signs: Temp: 99.5 F (37.5 C) (02/17 2057) Temp Source: Oral (02/17 2057) BP: 173/73 (02/17 2057) Pulse Rate: 68 (02/17 2207)  Labs:  Recent Labs  09/28/16 1700  HGB 14.1  HCT 42.7  PLT 156  CREATININE 0.66    Estimated Creatinine Clearance: 65.3 mL/min (by C-G formula based on SCr of 0.66 mg/dL).   Medical History: Past Medical History:  Diagnosis Date  . Arthritis   . Asthma   . Chronic back pain   . Depression   . Hyperlipemia   . Hypertension   . Scoliosis   . Sleep apnea    cpap - settingsi at 3     Medications:  Infusions:  . sodium chloride 100 mL/hr at 09/28/16 1945  . heparin      Assessment: Patient with new PE, with right heart strain, noted on CT.  Enoxaparin 40mg  sq x1 at 2/17 2126.  Will give ~1/2 usual heparin bolus due to prior enoxaparin.  Goal of Therapy:  Heparin level 0.3-0.7 units/ml Monitor platelets by anticoagulation protocol: Yes   Plan:  Heparin bolus 2000  units iv x1 Heparin drip at  1200 units/hr Daily CBC Next heparin level at 1000    [name], [name] 09/29/2016,2:32 AM

## 2016-09-29 NOTE — Progress Notes (Signed)
Venous doppler of L leg performed. Pulse is audible.

## 2016-09-29 NOTE — Progress Notes (Signed)
ANTICOAGULATION CONSULT NOTE   Pharmacy Consult for Heparin Indication: pulmonary embolus  Allergies  Allergen Reactions  . Codeine Itching    Patient Measurements: Height: 5\' 2"  (157.5 cm) Weight: 199 lb 11.8 oz (90.6 kg) IBW/kg (Calculated) : 50.1 Heparin Dosing Weight:   Vital Signs: Temp: 98.3 F (36.8 C) (02/18 1958) Temp Source: Oral (02/18 1958) BP: 120/59 (02/18 2000) Pulse Rate: 55 (02/18 2000)  Labs:  Recent Labs  09/28/16 1700 09/29/16 0819 09/29/16 1152 09/29/16 1944  HGB 14.1 12.7  --   --   HCT 42.7 38.5  --   --   PLT 156 143*  --   --   HEPARINUNFRC  --   --  0.56 0.76*  CREATININE 0.66 0.68  --   --   TROPONINI  --  <0.03  --   --     Estimated Creatinine Clearance: 62.6 mL/min (by C-G formula based on SCr of 0.68 mg/dL).   Medical History: Past Medical History:  Diagnosis Date  . Arthritis   . Asthma   . Chronic back pain   . Depression   . Hyperlipemia   . Hypertension   . Scoliosis   . Sleep apnea    cpap - settingsi at 3     Medications:  Infusions:  . heparin      Assessment: 76 y.o.woman with a history of HTN, HLD, OSA on CPAP qHS, asthma, chronic pain, and depression who lives with her niece. She was reportedly in her baseline state of health until two days ago. Since then, she has demonstrated increased confusion, lethargy, and fatigue.  Pt found to have acute bilateral PE with right heart strain.    Enoxaparin 40mg  sq x1 at 2/17 2126.  Will give ~1/2 usual heparin bolus due to prior enoxaparin.  09/29/2016 HL slightly supratherapeutic No bleeding or other issues noted  Goal of Therapy:  Heparin level 0.3-0.7 units/ml Monitor platelets by anticoagulation protocol: Yes   Plan:  Decrease heparin drip to 1100 units/hr Heparin level in 8 hours Daily CBC    Terrilee Files, PharmD 09/29/2016, 8:34 PM

## 2016-09-29 NOTE — Progress Notes (Signed)
  Echocardiogram 2D Echocardiogram has been performed.  Leta Jungling M 09/29/2016, 8:10 AM

## 2016-09-29 NOTE — Care Management Note (Addendum)
Case Management Note  Patient Details  Name: [name] MRN: [id number] Date of Birth: March 18, 1941  Subjective/Objective:    Acute encephalopathy, Bilateral PE, HTN                 Action/Plan: Discharge Planning: NCM spoke to pt's son, [name], [phone]. Son request a transport chair for home. Contacted [facility] and they can do transport chair. Will attending to put in order for Transport Chair for home. Pt will be staying in home with son and son states he has stairs. Will need PT/OT recommendations for home. Pt may benefit in going to SNF-rehab.   NCM spoke to son and is agreeable to SNF -rehab.   Expected Discharge Date:               Expected Discharge Plan:  Home w Home Health Services  In-House Referral:  Clinical Social Work  Discharge planning Services  CM Consult  Post Acute Care Choice:  Home Health Choice offered to:     DME Arranged:    DME Agency:     HH Arranged:    HH Agency:     Status of Service:  In process, will continue to follow  If discussed at Long Length of Stay Meetings, dates discussed:    Additional Comments:  Elliot Cousin, RN 09/29/2016, 4:46 PM

## 2016-09-29 NOTE — Progress Notes (Signed)
Initial Nutrition Assessment  DOCUMENTATION CODES:   Obesity unspecified  INTERVENTION:  Ensure Enlive po BID, each supplement provides 350 kcal and 20 grams of protein  NUTRITION DIAGNOSIS:   Unintentional weight loss related to lethargy/confusion, acute illness as evidenced by 12 percent weight loss in 3 months.  GOAL:   Patient will meet greater than or equal to 90% of their needs  MONITOR:   PO intake, Supplement acceptance, Weight trends, Labs  REASON FOR ASSESSMENT:   Consult Assessment of nutrition requirement/status  ASSESSMENT:   76 y.o. woman with a history of HTN, HLD, OSA on CPAP qHS, asthma, chronic pain, and depression who lives with her niece.  She was reportedly in her baseline state of health until two days ago.  Since then, she has demonstrated increased confusion, lethargy, and fatigue.  Pt found to have Flu, acute bilateral PE with right heart strain.   Met with pt in room today. Pt reports that she feels poorly today and has little appetite. Pt reports that she is not eating much food in hospital and was eating poorly for 1 month pta. Per chart, pt has lost 26lbs(12%) in 3 months which is severe. RD discussed with patient the importance of adequate intake; RD will order Ensure. Pt with hypokalemia today. Monitor and supplement as needed per MD discretion.   Medications reviewed and include: fentanyl, protonix, senokot, heparin  Labs reviewed: K 3.1(L), Ca 8.8(L)  Nutrition-Focused physical exam completed. Findings are no fat depletion, no muscle depletion, and no edema.   Diet Order:  Diet Heart Room service appropriate? Yes; Fluid consistency: Thin  Skin:  Reviewed, no issues  Last BM:  2/16  Height:   Ht Readings from Last 1 Encounters:  09/29/16 '5\' 2"'  (1.575 m)    Weight:   Wt Readings from Last 1 Encounters:  09/29/16 199 lb 11.8 oz (90.6 kg)    Ideal Body Weight:  50 kg  BMI:  Body mass index is 36.53 kg/m.  Estimated Nutritional  Needs:   Kcal:  1600-1800kcal/day   Protein:  90-108g/day   Fluid:  >1.6L/day   EDUCATION NEEDS:   No education needs identified at this time  Koleen Distance, RD, LDN Pager #[phone]

## 2016-09-30 ENCOUNTER — Inpatient Hospital Stay (HOSPITAL_COMMUNITY): Payer: Medicare Other

## 2016-09-30 DIAGNOSIS — M7989 Other specified soft tissue disorders: Secondary | ICD-10-CM

## 2016-09-30 DIAGNOSIS — M79609 Pain in unspecified limb: Secondary | ICD-10-CM

## 2016-09-30 LAB — HEPARIN LEVEL (UNFRACTIONATED)
Heparin Unfractionated: 0.94 IU/mL — ABNORMAL HIGH (ref 0.30–0.70)
Heparin Unfractionated: 0.6 IU/mL (ref 0.30–0.70)

## 2016-09-30 LAB — COMPREHENSIVE METABOLIC PANEL
ALBUMIN: 3.4 g/dL — AB (ref 3.5–5.0)
ALK PHOS: 33 U/L — AB (ref 38–126)
ALT: 16 U/L (ref 14–54)
ANION GAP: 6 (ref 5–15)
AST: 23 U/L (ref 15–41)
BILIRUBIN TOTAL: 0.9 mg/dL (ref 0.3–1.2)
BUN: 16 mg/dL (ref 6–20)
CALCIUM: 8.6 mg/dL — AB (ref 8.9–10.3)
CO2: 26 mmol/L (ref 22–32)
CREATININE: 0.65 mg/dL (ref 0.44–1.00)
Chloride: 105 mmol/L (ref 101–111)
GFR calc Af Amer: >60 mL/min (ref >60)
GFR calc non Af Amer: >60 mL/min (ref >60)
GLUCOSE: 92 mg/dL (ref 65–99)
Potassium: 3.6 mmol/L (ref 3.5–5.1)
SODIUM: 137 mmol/L (ref 135–145)
TOTAL PROTEIN: 6.5 g/dL (ref 6.5–8.1)

## 2016-09-30 LAB — CBC
HCT: 37 % (ref 36.0–46.0)
HEMOGLOBIN: 11.9 g/dL — AB (ref 12.0–15.0)
MCH: 30.3 pg (ref 26.0–34.0)
MCHC: 32.2 g/dL (ref 30.0–36.0)
MCV: 94.1 fL (ref 78.0–100.0)
Platelets: 126 10*3/uL — ABNORMAL LOW (ref 150–400)
RBC: 3.93 MIL/uL (ref 3.87–5.11)
RDW: 14.8 % (ref 11.5–15.5)
WBC: 5.4 10*3/uL (ref 4.0–10.5)

## 2016-09-30 MED ORDER — METOPROLOL TARTRATE 5 MG/5ML IV SOLN
INTRAVENOUS | Status: AC
Start: 2016-09-30 — End: 2016-09-30
  Filled 2016-09-30: qty 5

## 2016-09-30 MED ORDER — OXYCODONE HCL 5 MG PO TABS
15.0000 mg | ORAL_TABLET | Freq: Three times a day (TID) | ORAL | Status: DC | PRN
  Administered 2016-09-30 – 2016-10-01 (×2): 15 mg via ORAL
  Filled 2016-09-30 (×2): qty 3

## 2016-09-30 MED ORDER — HYDRALAZINE HCL 10 MG PO TABS
10.0000 mg | ORAL_TABLET | Freq: Three times a day (TID) | ORAL | Status: DC
  Administered 2016-09-30 – 2016-10-02 (×7): 10 mg via ORAL
  Filled 2016-09-30 (×10): qty 1

## 2016-09-30 MED ORDER — HEPARIN (PORCINE) IN NACL 100-0.45 UNIT/ML-% IJ SOLN
900.0000 [IU]/h | INTRAMUSCULAR | Status: AC
  Administered 2016-09-30 – 2016-10-01 (×2): 900 [IU]/h via INTRAVENOUS
  Filled 2016-09-30 (×2): qty 250

## 2016-09-30 NOTE — Progress Notes (Signed)
09/30/2016 6:29 PM  RN let me know about the bradycardia patient was experiencing and we decided to stop the coreg for now and monitor.    Maryln Manuel, MD

## 2016-09-30 NOTE — Progress Notes (Signed)
  Progress Note   Date: 09/30/2016  Patient Name: [name]        MRN#: [id number]   Clarification of the diagnosis of obesity:   morbid obesity    Standley Dakins, MD

## 2016-09-30 NOTE — Progress Notes (Signed)
  VASCULAR LAB PRELIMINARY  PRELIMINARY  PRELIMINARY  PRELIMINARY  Bilateral lower extremity venous duplex completed.  Per protocol when DVT is noted in the requested extremity.  Preliminary report:  Left  - Positive for an acute DVT noted on the popliteal vein. There is no evidence of a superficial thrombosis or Baker's cyst. Right - No evidence of a DVT, superfical thrombosis, or Baker's cyst.  [name], RVS 09/30/2016, 10:17 AM

## 2016-09-30 NOTE — Progress Notes (Signed)
ANTICOAGULATION CONSULT NOTE   Pharmacy Consult for Heparin Indication: pulmonary embolus  Allergies  Allergen Reactions  . Codeine Itching    Patient Measurements: Height: 5\' 2"  (157.5 cm) Weight: 199 lb 11.8 oz (90.6 kg) IBW/kg (Calculated) : 50.1 Heparin Dosing Weight:   Vital Signs: Temp: 98.3 F (36.8 C) (02/19 0341) Temp Source: Axillary (02/19 0341) BP: 160/63 (02/19 0400) Pulse Rate: 51 (02/19 0400)  Labs:  Recent Labs  09/28/16 1700 09/29/16 0819 09/29/16 1152 09/29/16 1944 09/30/16 0530  HGB 14.1 12.7  --   --  11.9*  HCT 42.7 38.5  --   --  37.0  PLT 156 143*  --   --  126*  HEPARINUNFRC  --   --  0.56 0.76* 0.94*  CREATININE 0.66 0.68  --   --  0.65  TROPONINI  --  <0.03  --   --   --     Estimated Creatinine Clearance: 62.6 mL/min (by C-G formula based on SCr of 0.65 mg/dL).   Medical History: Past Medical History:  Diagnosis Date  . Arthritis   . Asthma   . Chronic back pain   . Depression   . Hyperlipemia   . Hypertension   . Scoliosis   . Sleep apnea    cpap - settingsi at 3     Medications:  Infusions:  . heparin      Assessment: 76 y.o.woman with a history of HTN, HLD, OSA on CPAP qHS, asthma, chronic pain, and depression who lives with her niece. She was reportedly in her baseline state of health until two days ago. Since then, she has demonstrated increased confusion, lethargy, and fatigue.  Pt found to have acute bilateral PE with right heart strain.    Enoxaparin 40mg  sq x1 at 2/17 2126.  Will give ~1/2 usual heparin bolus due to prior enoxaparin.  09/30/2016  Heparin level remains supratherapeutic despite rate decrease  No bleeding or other issues noted  Goal of Therapy:  Heparin level 0.3-0.7 units/ml Monitor platelets by anticoagulation protocol: Yes   Plan:   Decrease heparin drip to 900 units/hr  Recheck heparin level in 8 hours  Daily CBC, heparin level    Loralee Pacas, PharmD, BCPS Pager:  [phone] 09/30/2016, 7:12 AM

## 2016-09-30 NOTE — Progress Notes (Signed)
PROGRESS NOTE    [name]  BJY:[id number]  DOB: 1940/12/01  DOA: 09/28/2016 PCP: Sarah German, MD Outpatient Specialists:   Hospital course: [name] is a 76 y.o. woman with a history of HTN, HLD, OSA on CPAP qHS, asthma, chronic pain, and depression who lives with her niece.  She was reportedly in her baseline state of health until two days ago.  Since then, she has demonstrated increased confusion, lethargy, and fatigue.  Pt found to have acute bilateral PE with right heart strain.  Pt had hypertensive emergency and started on cardene drip.  Pt was transferred to SDU and PCCM was consulted.     Assessment & Plan:  Acute encephalopathy, multifactorial causes given influenza, bilateral PE and hypertensive emergency --Resolved now with supportive therapy --Blood cultures NGTD --Flu B test positive. --TSH 0.177, adding free T4 and T3, TSH was low 2 years ago  Bilateral PE with right heart strain and presumed DVT LLE --Continue IV heparin infusion today with plans to transition to oral DOAC 2/20 with assistance from pharmacists --Venous dopplers pending    Left knee and leg pain with new swelling / DVT LLE --suspect from acute DVT post left knee surgery --Venous doppler left leg to rule out DVT pending  Chronic pain --Continue fentanyl patch (due to be changed tomorrow) but will minimize oral narcotics until her mental status improves --Added laxatives to regimen  HTN --Resume home lisinopril, add amlodipine and coreg --IV hydralazine prn --Weaned off cardene drip --BPs still suboptimal, adding hydralazine oral tabs 2/19  Ascending thoracic aortic aneurysm at 4.5 cm.  Pt says that she has been aware of this and it has been followed by her PCP Dr. Concepcion [name] with semi-annual scans.  Recommend semi-annual imaging followup by CTA or MRA and referral to cardiothoracic surgery if not already obtained.  Plan to refer to CT surgery at discharge.     HLD --Statin  OSA --CPAP per home regimen  Mood disorder NOS/depression --Abilify, Buspar, Pristiq, Aricept  GERD --PPI  DVT prophylaxis: Lovenox Code Status: FULL Family Communication: Son and daughter-in-law present in the ED at time of admission. Disposition Plan: Transfer to telemetry 2/19  Consultants:  PCCM  Subjective: Pt is awake, alert, NAD, oriented without complaints  Objective: Vitals:   09/30/16 0000 09/30/16 0200 09/30/16 0341 09/30/16 0400  BP: (!) 153/62 (!) 151/64  (!) 160/63  Pulse: (!) 52 (!) 52  (!) 51  Resp: 14 14  14   Temp:   98.3 F (36.8 C)   TempSrc:   Axillary   SpO2: 91% 92%  90%  Weight:      Height:        Intake/Output Summary (Last 24 hours) at 09/30/16 0729 Last data filed at 09/30/16 0400  Gross per 24 hour  Intake           494.48 ml  Output              200 ml  Net           294.48 ml   Filed Weights   09/28/16 1643 09/28/16 2057 09/29/16 0345  Weight: 102.1 kg (225 lb) 90.6 kg (199 lb 11.8 oz) 90.6 kg (199 lb 11.8 oz)    Exam:  General exam: awake, alert, NAD, flat affect Respiratory system: Clear. No increased work of breathing. Cardiovascular system: S1 & S2 heard.  Gastrointestinal system: Abdomen is nondistended, soft and nontender. Normal bowel sounds heard. Central nervous system: Alert and oriented.  No focal neurological deficits. Extremities: swollen LLE with cords felt in left calf  Data Reviewed: Basic Metabolic Panel:  Recent Labs Lab 09/28/16 1700 09/29/16 0819 09/30/16 0530  NA 139 139 137  K 3.5 3.1* 3.6  CL 100* 104 105  CO2 30 26 26   GLUCOSE 89 115* 92  BUN 12 10 16   CREATININE 0.66 0.68 0.65  CALCIUM 9.9 8.8* 8.6*   Liver Function Tests:  Recent Labs Lab 09/28/16 1700 09/30/16 0530  AST 26 23  ALT 16 16  ALKPHOS 46 33*  BILITOT 1.3* 0.9  PROT 8.0 6.5  ALBUMIN 4.4 3.4*   No results for input(s): LIPASE, AMYLASE in the last 168 hours. No results for input(s): AMMONIA  in the last 168 hours. CBC:  Recent Labs Lab 09/28/16 1700 09/29/16 0819 09/30/16 0530  WBC 7.6 8.0 5.4  HGB 14.1 12.7 11.9*  HCT 42.7 38.5 37.0  MCV 93.8 93.2 94.1  PLT 156 143* 126*   Cardiac Enzymes:  Recent Labs Lab 09/29/16 0819  TROPONINI <0.03   CBG (last 3)  No results for input(s): GLUCAP in the last 72 hours. Recent Results (from the past 240 hour(s))  MRSA PCR Screening     Status: None   Collection Time: 09/29/16  3:43 AM  Result Value Ref Range Status   MRSA by PCR NEGATIVE NEGATIVE Final    Comment:        The GeneXpert MRSA Assay (FDA approved for NASAL specimens only), is one component of a comprehensive MRSA colonization surveillance program. It is not intended to diagnose MRSA infection nor to guide or monitor treatment for MRSA infections.      Studies: Dg Chest 2 View  Result Date: 09/28/2016 CLINICAL DATA:  76 year old female with history of sepsis. Lethargy and fever. Knee swelling and pain since yesterday. Former smoker. EXAM: CHEST  2 VIEW COMPARISON:  Chest x-ray a 02/29/2016. FINDINGS: Lung volumes are low. There appears to be a right sided hilar mass on the frontal projection, although cardiomediastinal contours are grossly distorted by patient's rotation to the right. This could conceivably reflect central airspace consolidation, but this is not favored. Increased density over the hilar regions is noted on the lateral projection as well. No pleural effusions. No evidence of pulmonary edema. Heart size appears mildly enlarged. Aortic atherosclerosis. IMPRESSION: 1. The appearance of the chest is concerning for potential right-sided hilar malignancy. Conceivably, these findings could be related to a central area of airspace consolidation in the right lung, but this is not favored. Further evaluation with contrast enhanced chest CT is recommended at this time to better evaluate these findings. 2. Aortic atherosclerosis. 3. Cardiomegaly.  Electronically Signed   By: Trudie Reed M.D.   On: 09/28/2016 17:48   Ct Head Wo Contrast  Result Date: 09/28/2016 CLINICAL DATA:  Acute onset of generalized weakness, fever, fatigue and knee pain. Cough. Altered mental status. Initial encounter. EXAM: CT HEAD WITHOUT CONTRAST TECHNIQUE: Contiguous axial images were obtained from the base of the skull through the vertex without intravenous contrast. COMPARISON:  CT of the head performed 07/18/2016 FINDINGS: Brain: No evidence of acute infarction, hemorrhage, hydrocephalus, extra-axial collection or mass lesion/mass effect. Prominence of the ventricles and sulci reflects mild to moderate cortical volume loss. Mild cerebellar atrophy is noted. Scattered periventricular white matter change likely reflects small vessel ischemic microangiopathy. The brainstem and fourth ventricle are within normal limits. The basal ganglia are unremarkable in appearance. The cerebral hemispheres demonstrate grossly normal gray-white differentiation. No  mass effect or midline shift is seen. Vascular: No hyperdense vessel or unexpected calcification. Skull: There is no evidence of fracture; visualized osseous structures are unremarkable in appearance. Sinuses/Orbits: The visualized portions of the orbits are within normal limits. The paranasal sinuses and mastoid air cells are well-aerated. Other: No significant soft tissue abnormalities are seen. IMPRESSION: 1. No acute intracranial pathology seen on CT. 2. Mild to moderate cortical volume loss and scattered small vessel ischemic microangiopathy. Electronically Signed   By: Roanna Raider M.D.   On: 09/28/2016 18:38   Ct Chest W Contrast  Result Date: 09/29/2016 CLINICAL DATA:  Prominent right hilum, rule out mass EXAM: CT CHEST WITH CONTRAST TECHNIQUE: Multidetector CT imaging of the chest was performed during intravenous contrast administration. CONTRAST:  50mL ISOVUE-300 IOPAMIDOL (ISOVUE-300) INJECTION 61% COMPARISON:   09/28/2016 CXR FINDINGS: Cardiovascular: There are acute bilateral multilobar pulmonary emboli contributing to prominence of the pulmonary hilum with right heart strain. RV/LV ratio is 1.26. The aorta is aneurysmal the 4.5 cm along the ascending portion with tapering at the aortic arch. No descending thoracic aortic aneurysm. Mediastinum/Nodes: No enlarged mediastinal, hilar, or axillary lymph nodes. Thyroid gland, trachea, and esophagus demonstrate no significant findings. Lungs/Pleura: There is minimal atelectasis posteriorly in the lower lobes. No effusion or pneumothorax. No dominant mass. Upper Abdomen: No acute abnormality. Musculoskeletal: Chronic superior endplate compression of T8. 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 IMPRESSION: 1. Positive for acute PE with CT evidence of right heart strain (RV/LV Ratio = 1.26) consistent with at least submassive (intermediate risk) PE. The presence of right heart strain has been associated with an increased risk of morbidity and mortality. Please activate Code PE by paging [phone]. Critical Value/emergent results were called by telephone at the time of interpretation on 09/29/2016 at 1:52 am to Dr. Michael Litter , who verbally acknowledged these results. 2. 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 Recommend semi-annual imaging followup by CTA or MRA and referral to cardiothoracic surgery if not already obtained. This recommendation follows 2010 ACCF/AHA/AATS/ACR/ASA/SCA/SCAI/SIR/STS/SVM Guidelines for the Diagnosis and Management of Patients With Thoracic Aortic Disease. Circulation. 2010; 121: R416-L845 3. No pulmonary mass. Electronically Signed   By: Tollie Eth M.D.   On: 09/29/2016 01:52   Dg Knee Complete 4 Views Left  Result Date: 09/28/2016 CLINICAL DATA:  Acute onset of left knee pain.  Initial encounter. EXAM: LEFT KNEE - COMPLETE 4+ VIEW COMPARISON:  None. FINDINGS: The patient's total knee arthroplasty is grossly unremarkable in appearance,  without evidence of loosening or fracture. A small knee joint effusion is noted. Edema is noted at Hoffa's fat pad. Mild soft tissue swelling is noted about the knee. IMPRESSION: 1. No evidence of fracture or dislocation. Total knee arthroplasty is unremarkable in appearance, without evidence of loosening. 2. Small knee joint effusion noted. Edema at Hoffa's fat pad. Mild soft tissue swelling about the knee. Electronically Signed   By: Roanna Raider M.D.   On: 09/28/2016 20:15   Scheduled Meds: . amLODipine  5 mg Oral Daily  . ARIPiprazole  5 mg Oral Daily  . atorvastatin  20 mg Oral Daily  . busPIRone  30 mg Oral BID  . carvedilol  3.125 mg Oral BID WC  . donepezil  10 mg Oral QHS  . feeding supplement (ENSURE ENLIVE)  237 mL Oral BID BM  . fentaNYL  75 mcg Transdermal Q72H  . hydrALAZINE  10 mg Oral Q8H  . lisinopril  40 mg Oral Daily  . metoprolol      .  oseltamivir  75 mg Oral BID  . pantoprazole  40 mg Oral Daily  . potassium chloride  30 mEq Oral BID  . prednisoLONE acetate  1 drop Both Eyes Q2H while awake  . senna-docusate  1 tablet Oral BID  . venlafaxine XR  75 mg Oral Q breakfast   Continuous Infusions: . heparin      Principal Problem:   Acute encephalopathy Active Problems:   Status post total left knee replacement   Left knee pain   Left leg swelling   Hilar mass   OSA on CPAP   HTN (hypertension)   HLD (hyperlipidemia)   Chronic pain   Acute pulmonary embolism [facility])  Critical Care Time spent: 34 mins  Standley Dakins, MD, FAAFP [facility] Pager [phone]  If 7PM-7AM, please contact night-coverage [email or web address] Password [email or web address] 09/30/2016, 7:29 AM    LOS: 1 day

## 2016-09-30 NOTE — Progress Notes (Signed)
ANTICOAGULATION CONSULT NOTE   Pharmacy Consult for Heparin Indication: pulmonary embolus  Allergies  Allergen Reactions  . Codeine Itching    Patient Measurements: Height: 5\' 2"  (157.5 cm) Weight: 199 lb 11.8 oz (90.6 kg) IBW/kg (Calculated) : 50.1 Heparin Dosing Weight:   Vital Signs: Temp: 98.3 F (36.8 C) (02/19 1234) Temp Source: Oral (02/19 1234) BP: 141/66 (02/19 1234) Pulse Rate: 52 (02/19 1234)  Labs:  Recent Labs  09/28/16 1700 09/29/16 0819  09/29/16 1944 09/30/16 0530 09/30/16 1601  HGB 14.1 12.7  --   --  11.9*  --   HCT 42.7 38.5  --   --  37.0  --   PLT 156 143*  --   --  126*  --   HEPARINUNFRC  --   --   < > 0.76* 0.94* 0.60  CREATININE 0.66 0.68  --   --  0.65  --   TROPONINI  --  <0.03  --   --   --   --   < > = values in this interval not displayed.  Estimated Creatinine Clearance: 62.6 mL/min (by C-G formula based on SCr of 0.65 mg/dL).   Medical History: Past Medical History:  Diagnosis Date  . Arthritis   . Asthma   . Chronic back pain   . Depression   . Hyperlipemia   . Hypertension   . Scoliosis   . Sleep apnea    cpap - settingsi at 3     Medications:  Infusions:  . heparin 900 Units/hr (09/30/16 0810)    Assessment: 76 y.o.woman with a history of HTN, HLD, OSA on CPAP qHS, asthma, chronic pain, and depression who lives with her niece. She was reportedly in her baseline state of health until two days ago. Since then, she has demonstrated increased confusion, lethargy, and fatigue.  Pt found to have acute bilateral PE with right heart strain.    Enoxaparin 40mg  sq x1 at 2/17 2126.  Will give ~1/2 usual heparin bolus due to prior enoxaparin.  09/30/2016  Heparin level is therapeutic at 0.60   No bleeding or other issues noted  Goal of Therapy:  Heparin level 0.3-0.7 units/ml Monitor platelets by anticoagulation protocol: Yes   Plan:   Continue heparin drip at 900 units/hr  Recheck heparin level with AM  labs  Daily CBC, heparin level     Adalberto Cole, PharmD, BCPS Pager [phone] 09/30/2016 5:15 PM

## 2016-09-30 NOTE — Progress Notes (Signed)
Date:  September 30, 2016 Chart reviewed for concurrent status and case management needs. Admitted for bilateral PE's and iv heparin therapy. Will continue to follow patient progress. Discharge Planning: following for needs Expected discharge date: [id number] Marcelle Smiling, BSN, [place], [place]   [phone]

## 2016-10-01 ENCOUNTER — Encounter (HOSPITAL_COMMUNITY): Payer: Self-pay | Admitting: Family Medicine

## 2016-10-01 LAB — CBC
HCT: 38.4 % (ref 36.0–46.0)
Hemoglobin: 12.4 g/dL (ref 12.0–15.0)
MCH: 30.8 pg (ref 26.0–34.0)
MCHC: 32.3 g/dL (ref 30.0–36.0)
MCV: 95.3 fL (ref 78.0–100.0)
PLATELETS: 130 10*3/uL — AB (ref 150–400)
RBC: 4.03 MIL/uL (ref 3.87–5.11)
RDW: 14.9 % (ref 11.5–15.5)
WBC: 4.5 10*3/uL (ref 4.0–10.5)

## 2016-10-01 LAB — COMPREHENSIVE METABOLIC PANEL
ALK PHOS: 34 U/L — AB (ref 38–126)
ALT: 17 U/L (ref 14–54)
ANION GAP: 6 (ref 5–15)
AST: 24 U/L (ref 15–41)
Albumin: 3.3 g/dL — ABNORMAL LOW (ref 3.5–5.0)
BILIRUBIN TOTAL: 1 mg/dL (ref 0.3–1.2)
BUN: 13 mg/dL (ref 6–20)
CALCIUM: 9.1 mg/dL (ref 8.9–10.3)
CO2: 27 mmol/L (ref 22–32)
CREATININE: 0.56 mg/dL (ref 0.44–1.00)
Chloride: 106 mmol/L (ref 101–111)
GFR calc non Af Amer: >60 mL/min (ref >60)
GLUCOSE: 99 mg/dL (ref 65–99)
Potassium: 4 mmol/L (ref 3.5–5.1)
Sodium: 139 mmol/L (ref 135–145)
TOTAL PROTEIN: 6.5 g/dL (ref 6.5–8.1)

## 2016-10-01 LAB — T4, FREE: Free T4: 1.02 ng/dL (ref 0.61–1.12)

## 2016-10-01 LAB — HEPARIN LEVEL (UNFRACTIONATED): Heparin Unfractionated: 0.59 IU/mL (ref 0.30–0.70)

## 2016-10-01 MED ORDER — APIXABAN 5 MG PO TABS: 5.0000 mg | ORAL_TABLET | Freq: Two times a day (BID) | ORAL | Status: DC

## 2016-10-01 MED ORDER — AMLODIPINE BESYLATE 10 MG PO TABS
10.0000 mg | ORAL_TABLET | Freq: Every day | ORAL | Status: DC
  Administered 2016-10-01 – 2016-10-02 (×2): 10 mg via ORAL
  Filled 2016-10-01: qty 1

## 2016-10-01 MED ORDER — APIXABAN 5 MG PO TABS
10.0000 mg | ORAL_TABLET | Freq: Two times a day (BID) | ORAL | Status: DC
  Administered 2016-10-01 – 2016-10-02 (×3): 10 mg via ORAL
  Filled 2016-10-01 (×3): qty 2

## 2016-10-01 MED ORDER — HYDROCHLOROTHIAZIDE 12.5 MG PO CAPS
12.5000 mg | ORAL_CAPSULE | Freq: Every day | ORAL | Status: DC
Start: 2016-10-01 — End: 2016-10-02
  Administered 2016-10-01 – 2016-10-02 (×2): 12.5 mg via ORAL
  Filled 2016-10-01 (×2): qty 1

## 2016-10-01 NOTE — Discharge Instructions (Signed)
Information on my medicine - ELIQUIS® (apixaban) ° °This medication education was reviewed with me or my healthcare representative as part of my discharge preparation.  The pharmacist that spoke with me during my hospital stay was:  °Arely Tinner, Student-PharmD ° °Why was Eliquis® prescribed for you? °Eliquis® was prescribed to treat blood clots that may have been found in the veins of your legs (deep vein thrombosis) or in your lungs (pulmonary embolism) and to reduce the risk of them occurring again. ° °What do You need to know about Eliquis® ? °The starting dose is 10 mg (two 5 mg tablets) taken TWICE daily for the FIRST SEVEN (7) DAYS, then on (enter date)  ***  the dose is reduced to ONE 5 mg tablet taken TWICE daily.  Eliquis® may be taken with or without food.  ° °Try to take the dose about the same time in the morning and in the evening. If you have difficulty swallowing the tablet whole please discuss with your pharmacist how to take the medication safely. ° °Take Eliquis® exactly as prescribed and DO NOT stop taking Eliquis® without talking to the doctor who prescribed the medication.  Stopping may increase your risk of developing a new blood clot.  Refill your prescription before you run out. ° °After discharge, you should have regular check-up appointments with your healthcare provider that is prescribing your Eliquis®. °   °What do you do if you miss a dose? °If a dose of ELIQUIS® is not taken at the scheduled time, take it as soon as possible on the same day and twice-daily administration should be resumed. The dose should not be doubled to make up for a missed dose. ° °Important Safety Information °A possible side effect of Eliquis® is bleeding. You should call your healthcare provider right away if you experience any of the following: °? Bleeding from an injury or your nose that does not stop. °? Unusual colored urine (red or dark brown) or unusual colored stools (red or black). °? Unusual bruising  for unknown reasons. °? A serious fall or if you hit your head (even if there is no bleeding). ° °Some medicines may interact with Eliquis® and might increase your risk of bleeding or clotting while on Eliquis®. To help avoid this, consult your healthcare provider or pharmacist prior to using any new prescription or non-prescription medications, including herbals, vitamins, non-steroidal anti-inflammatory drugs (NSAIDs) and supplements. ° °This website has more information on Eliquis® (apixaban): http://www.eliquis.com/eliquis/home ° °

## 2016-10-01 NOTE — Evaluation (Signed)
Occupational Therapy Evaluation Patient Details Name: [name] MRN: [id number] DOB: 01/30/1941 Today's Date: 10/01/2016    History of Present Illness [name] is a 76 y.o. woman with a history of HTN, HLD, OSA on CPAP qHS, asthma, chronic pain, and depression who lives with her niece.  Two days of increased confusion, lethargy, and fatigue.  Pt found to have acute bilateral PE with right heart strain. Positive left leg DVT.   Clinical Impression   Pt admitted with confusion, lethargy and fatigue. Pt currently with functional limitations due to the deficits listed below (see OT Problem List).  Pt will benefit from skilled OT to increase their safety and independence with ADL and functional mobility for ADL to facilitate discharge to venue listed below.      Follow Up Recommendations  Home health OT    Equipment Recommendations  None recommended by OT    Recommendations for Other Services       Precautions / Restrictions Precautions Precautions: Fall      Mobility Bed Mobility Overal bed mobility: Needs Assistance Bed Mobility: Sit to Supine     Supine to sit: HOB elevated;Supervision Sit to supine: Min assist   General bed mobility comments: no rails used  Transfers Overall transfer level: Needs assistance Equipment used: Rolling walker (2 wheeled) Transfers: Sit to/from Stand Sit to Stand: Min assist         General transfer comment: pt was fatigued from being in chair         ADL Overall ADL's : Needs assistance/impaired     Grooming: Set up;Sitting   Upper Body Bathing: Minimal assistance;Sitting   Lower Body Bathing: Moderate assistance;Sit to/from stand;Cueing for safety   Upper Body Dressing : Minimal assistance;Sitting   Lower Body Dressing: Moderate assistance;Sit to/from stand;Cueing for safety;Cueing for sequencing   Toilet Transfer: Minimal assistance;RW;Ambulation;Comfort height toilet   Toileting- Clothing Manipulation  and Hygiene: Minimal assistance;Sit to/from stand               Vision Patient Visual Report: No change from baseline              Pertinent Vitals/Pain Pain Assessment: No/denies pain     Hand Dominance     Extremity/Trunk Assessment Upper Extremity Assessment Upper Extremity Assessment: Generalized weakness   Lower Extremity Assessment Lower Extremity Assessment: LLE deficits/detail LLE Deficits / Details: noted edema       Communication Communication Communication: No difficulties   Cognition Arousal/Alertness: Awake/alert Behavior During Therapy: WFL for tasks assessed/performed Overall Cognitive Status: Within Functional Limits for tasks assessed                                Home Living Family/patient expects to be discharged to:: Private residence Living Arrangements: Children Available Help at Discharge: Family;Available 24 hours/day Type of Home: House Home Access: Stairs to enter [employer] of Steps: 3   Home Layout: One level     Bathroom Shower/Tub: Producer, television/film/video: Standard     Home Equipment: Environmental consultant - 2 wheels;Cane - single point   Additional Comments: plans to stay with son with 24/7 caregivers.      Prior Functioning/Environment Level of Independence: Needs assistance  Gait / Transfers Assistance Needed: Pt reports had progressed to cane for ambulation      Comments: granddaughter lives with her (goes to work at 3); helps her into shower  OT Problem List: Decreased strength;Decreased safety awareness;Impaired balance (sitting and/or standing);Decreased activity tolerance      OT Treatment/Interventions: Self-care/ADL training;DME and/or AE instruction;Patient/family education    OT Goals(Current goals can be found in the care plan section) Acute Rehab OT Goals Patient Stated Goal: to walk more OT Goal Formulation: With patient Time For Goal Achievement: 10/08/16 Potential to  Achieve Goals: Good  OT Frequency: Min 2X/week              End of Session Equipment Utilized During Treatment: Engineer, water Communication: Mobility status  Activity Tolerance: Patient limited by fatigue Patient left: in bed;with bed alarm set  OT Visit Diagnosis: Unsteadiness on feet (R26.81);Muscle weakness (generalized) (M62.81)                ADL either performed or assessed with clinical judgement  Time: 1229-1243 OT Time Calculation (min): 14 min Charges:  OT General Charges $OT Visit: 1 Procedure OT Evaluation $OT Eval Moderate Complexity: 1 Procedure G-Codes:     Lise Auer, OT [phone]  Einar Crow D 10/01/2016, 1:22 PM

## 2016-10-01 NOTE — Clinical Social Work Note (Signed)
Clinical Social Work Assessment  Patient Details  Name: [name] MRN: [id number] Date of Birth: 1940/12/12  Date of referral:  10/01/16               Reason for consult:  Facility Placement                Permission sought to share information with:  Facility Industrial/product designer granted to share information::  Yes, Verbal Permission Granted  Name::        Agency::     Relationship::     Contact Information:     Housing/Transportation Living arrangements for the past 2 months:  Skilled Nursing Facility Source of Information:  Patient, Adult Children Patient Interpreter Needed:  None Criminal Activity/Legal Involvement Pertinent to Current Situation/Hospitalization:  No - Comment as needed Significant Relationships:  Adult Children Lives with:  Self Do you feel safe going back to the place where you live?  No Need for family participation in patient care:  Yes (Comment)  Care giving concerns:  CSW received consult for SNF placement.    Social Worker assessment / plan:  CSW spoke with patient & son, [name] at bedside re: discharge planning. Son expressed interest in [facility] SNF. CSW confirmed with Clydie [name] at [facility] that they would be able to offer a bed.   Employment status:  Retired Health and safety inspector:  [employer] PT Recommendations:  Skilled Teacher, early years/pre / Referral to community resources:  Skilled Nursing Facility  Patient/Family's Response to care:    Patient/Family's Understanding of and Emotional Response to Diagnosis, Current Treatment, and Prognosis:    Emotional Assessment Appearance:  Appears stated age Attitude/Demeanor/Rapport:    Affect (typically observed):    Orientation:  Oriented to Self, Oriented to Place, Oriented to  Time, Oriented to Situation Alcohol / Substance use:    Psych involvement (Current and /or in the community):     Discharge Needs  Concerns to be addressed:    Readmission  within the last 30 days:    Current discharge risk:    Barriers to Discharge:      Arlyss Repress, LCSW 10/01/2016, 3:58 PM

## 2016-10-01 NOTE — NC FL2 (Signed)
MEDICAID FL2 LEVEL OF CARE SCREENING TOOL     IDENTIFICATION  Patient Name: [name] Birthdate: 10-20-1940 Sex: female Admission Date (Current Location): 09/28/2016  [facility] and [place] Number:  Producer, television/film/video and Address:  [facility],  [address], [place] [address]      Provider Number: [id number]  Attending Physician Name and Address:  Cleora Fleet, MD  Relative Name and Phone Number:       Current Level of Care: Hospital Recommended Level of Care: Skilled Nursing Facility Prior Approval Number:    Date Approved/Denied:   PASRR Number: [id number]  Discharge Plan: SNF    Current Diagnoses: Patient Active Problem List   Diagnosis Date Noted  . Acute pulmonary embolism (HCC) 09/29/2016  . Acute encephalopathy 09/28/2016  . Left knee pain 09/28/2016  . Left leg swelling 09/28/2016  . Hilar mass 09/28/2016  . OSA on CPAP 09/28/2016  . HTN (hypertension) 09/28/2016  . HLD (hyperlipidemia) 09/28/2016  . Chronic pain 09/28/2016  . Cellulitis of left leg 06/14/2016  . Osteoarthritis of left knee 05/17/2016  . 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    Orientation RESPIRATION BLADDER Height & Weight     Self, Time, Situation, Place  Normal Continent Weight: 199 lb 11.8 oz (90.6 kg) Height:  5\' 2"  (157.5 cm)  BEHAVIORAL SYMPTOMS/MOOD NEUROLOGICAL BOWEL NUTRITION STATUS      Continent Diet (Heart)  AMBULATORY STATUS COMMUNICATION OF NEEDS Skin   Extensive Assist Verbally Normal                       Personal Care Assistance Level of Assistance  Bathing, Dressing Bathing Assistance: Limited assistance   Dressing Assistance: Limited assistance     Functional Limitations Info             SPECIAL CARE FACTORS FREQUENCY  PT (By licensed PT), OT (By licensed OT)     PT Frequency: 5 OT Frequency: 5            Contractures      Additional Factors Info  Code Status, Allergies,  Isolation Precautions Code Status Info: Fullcode Allergies Info: Codeine     Isolation Precautions Info: Droplet: positive for Influenza B 2/17, started on Tamiflu 2/17     Current Medications (10/01/2016):  This is the current hospital active medication list Current Facility-Administered Medications  Medication Dose Route Frequency Provider Last Rate Last Dose  . acetaminophen (TYLENOL) suppository 650 mg  650 mg Rectal Q6H PRN Michael Litter, MD      . acetaminophen (TYLENOL) tablet 650 mg  650 mg Oral Q6H PRN Michael Litter, MD      . amLODipine (NORVASC) tablet 10 mg  10 mg Oral Daily Clanford Cyndie Mull, MD   10 mg at 10/01/16 1001  . apixaban (ELIQUIS) tablet 10 mg  10 mg Oral BID Danford Bad, RPH   10 mg at 10/01/16 1000   Followed by  . [START ON 10/08/2016] apixaban (ELIQUIS) tablet 5 mg  5 mg Oral BID Danford Bad, RPH      . ARIPiprazole (ABILIFY) tablet 5 mg  5 mg Oral Daily Michael Litter, MD   5 mg at 10/01/16 0955  . atorvastatin (LIPITOR) tablet 20 mg  20 mg Oral Daily Michael Litter, MD   20 mg at 10/01/16 0959  . busPIRone (BUSPAR) tablet 30 mg  30 mg Oral BID Michael Litter, MD   30 mg at  10/01/16 0950  . donepezil (ARICEPT) tablet 10 mg  10 mg Oral QHS Michael Litter, MD   10 mg at 09/30/16 2146  . feeding supplement (ENSURE ENLIVE) (ENSURE ENLIVE) liquid 237 mL  237 mL Oral BID BM Clanford L Johnson, MD   237 mL at 10/01/16 1007  . fentaNYL (DURAGESIC - dosed mcg/hr) 75 mcg  75 mcg Transdermal Q72H Michael Litter, MD   75 mcg at 09/28/16 2132  . hydrALAZINE (APRESOLINE) injection 10 mg  10 mg Intravenous Q4H PRN Jose Alexis Frock, MD   10 mg at 09/29/16 0419  . hydrALAZINE (APRESOLINE) tablet 10 mg  10 mg Oral Q8H Clanford L Johnson, MD   10 mg at 10/01/16 1329  . hydrochlorothiazide (MICROZIDE) capsule 12.5 mg  12.5 mg Oral Daily Clanford Cyndie Mull, MD   12.5 mg at 10/01/16 1148  . ipratropium-albuterol (DUONEB) 0.5-2.5 (3) MG/3ML nebulizer solution 3 mL  3 mL Nebulization Q4H  PRN Michael Litter, MD      . lisinopril (PRINIVIL,ZESTRIL) tablet 40 mg  40 mg Oral Daily Michael Litter, MD   40 mg at 10/01/16 1001  . ondansetron (ZOFRAN) injection 4 mg  4 mg Intravenous Q6H PRN Michael Litter, MD      . oseltamivir (TAMIFLU) capsule 75 mg  75 mg Oral BID Michael Litter, MD   75 mg at 10/01/16 0950  . oxyCODONE (Oxy IR/ROXICODONE) immediate release tablet 15 mg  15 mg Oral TID PRN Clanford Cyndie Mull, MD   15 mg at 10/01/16 1329  . pantoprazole (PROTONIX) EC tablet 40 mg  40 mg Oral Daily Michael Litter, MD   40 mg at 10/01/16 0956  . prednisoLONE acetate (PRED FORTE) 1 % ophthalmic suspension 1 drop  1 drop Both Eyes Q2H while awake Michael Litter, MD   1 drop at 10/01/16 1331  . senna-docusate (Senokot-S) tablet 1 tablet  1 tablet Oral BID Clanford Cyndie Mull, MD   1 tablet at 10/01/16 0956  . venlafaxine XR (EFFEXOR-XR) 24 hr capsule 75 mg  75 mg Oral Q breakfast Michael Litter, MD   75 mg at 10/01/16 1610     Discharge Medications: Please see discharge summary for a list of discharge medications.  Relevant Imaging Results:  Relevant Lab Results:   Additional Information SSN: [id number]  Arlyss Repress, LCSW

## 2016-10-01 NOTE — Progress Notes (Signed)
Per: CMT pt had a 5-beat run of V-Tach. RN S. Vennable made aware.  Will continue to monitor. Golden Hurter

## 2016-10-01 NOTE — Progress Notes (Signed)
ANTICOAGULATION CONSULT NOTE   Pharmacy Consult for Heparin >> Eliquis Indication: pulmonary embolus  Allergies  Allergen Reactions  . Codeine Itching    Patient Measurements: Height: 5\' 2"  (157.5 cm) Weight: 199 lb 11.8 oz (90.6 kg) IBW/kg (Calculated) : 50.1  Vital Signs: Temp: 97.5 F (36.4 C) (02/20 0547) Temp Source: Oral (02/20 0547) BP: 152/79 (02/20 0647) Pulse Rate: 49 (02/20 0647)  Labs:  Recent Labs  09/29/16 0819  09/30/16 0530 09/30/16 1601 10/01/16 0518  HGB 12.7  --  11.9*  --  12.4  HCT 38.5  --  37.0  --  38.4  PLT 143*  --  126*  --  130*  HEPARINUNFRC  --   < > 0.94* 0.60 0.59  CREATININE 0.68  --  0.65  --  0.56  TROPONINI <0.03  --   --   --   --   < > = values in this interval not displayed.  Estimated Creatinine Clearance: 62.6 mL/min (by C-G formula based on SCr of 0.56 mg/dL).   Medical History: Past Medical History:  Diagnosis Date  . Arthritis   . Asthma   . Chronic back pain   . Depression   . Hyperlipemia   . Hypertension   . Scoliosis   . Sleep apnea    cpap - settingsi at 3     Medications:  Infusions:  . heparin 900 Units/hr (10/01/16 0215)    Assessment: 76 y.o.woman with a history of HTN, HLD, OSA on CPAP qHS, asthma, chronic pain, and depression who lives with her niece. She was reportedly in her baseline state of health until two days ago.Since then, she has demonstrated increased confusion, lethargy, and fatigue. Pt found to have acute bilateral PE with right heart strain. Enoxaparin 40mg  sq x1 at 2/17 2126. Will give ~1/2 usual heparin bolus due to prior enoxaparin.  10/01/2016  Heparin level is therapeutic this AM on 900 units/hr  No bleeding or other issues noted  To switch to Eliquis today  SCr wnl  Goal of Therapy:  Heparin level 0.3-0.7 units/ml Monitor platelets by anticoagulation protocol: Yes   Plan:   Start Eliquis 10 mg PO bid x 7 days, then 5 mg PO bid thereafter  Stop heparin with  first dose of Eliquis  CBC at least q72 hr, SCr at least weekly while inpatient  Bernadene Person, PharmD, BCPS Pager: [phone] 10/01/2016, 4:23 PM

## 2016-10-01 NOTE — Evaluation (Signed)
Physical Therapy Evaluation Patient Details Name: [name] MRN: [id number] DOB: 10/04/1940 Today's Date: 10/01/2016   History of Present Illness  [name] is a 76 y.o. woman with a history of HTN, HLD, OSA on CPAP qHS, asthma, chronic pain, and depression who lives with her niece.  Two days of increased confusion, lethargy, and fatigue.  Pt found to have acute bilateral PE with right heart strain. Positive left leg DVT.  Clinical Impression  the patient is  Mobilizing well with RW. Gait is slow and steady. Should progress to Dc to home with son. Pt admitted with above diagnosis. Pt currently with functional limitations due to the deficits listed below (see PT Problem List). Pt will benefit from skilled PT to increase their independence and safety with mobility to allow discharge to the venue listed below.    Follow Up Recommendations Home health PT;Supervision/Assistance - 24 hour    Equipment Recommendations  None recommended by PT    Recommendations for Other Services       Precautions / Restrictions Precautions Precautions: Fall Restrictions Weight Bearing Restrictions: No      Mobility  Bed Mobility Overal bed mobility: Needs Assistance Bed Mobility: Supine to Sit     Supine to sit: HOB elevated;Supervision     General bed mobility comments: no rails used  Transfers Overall transfer level: Needs assistance Equipment used: Rolling walker (2 wheeled) Transfers: Sit to/from Stand Sit to Stand: Supervision         General transfer comment: patient demonstrates steady stance,   Ambulation/Gait Ambulation/Gait assistance: Min guard Ambulation Distance (Feet): 12 Feet (x 2) Assistive device: Rolling walker (2 wheeled) Gait Pattern/deviations: Step-to pattern;Step-through pattern Gait velocity: slow   General Gait Details: tends to have RW ahead. not relying on the RW 100%, cues for safety  Stairs            Wheelchair Mobility     Modified Rankin (Stroke Patients Only)       Balance                                             Pertinent Vitals/Pain Pain Assessment: No/denies pain    Home Living Family/patient expects to be discharged to:: Private residence Living Arrangements: Children Available Help at Discharge: Family;Available 24 hours/day Type of Home: House Home Access: Stairs to enter   [employer] of Steps: 3 Home Layout: One level Home Equipment: Walker - 2 wheels;Cane - single point Additional Comments: plans to stay with son with 24/7 caregivers.    Prior Function Level of Independence: Needs assistance   Gait / Transfers Assistance Needed: Pt reports had progressed to cane for ambulation            Hand Dominance        Extremity/Trunk Assessment   Upper Extremity Assessment Upper Extremity Assessment: Defer to OT evaluation    Lower Extremity Assessment Lower Extremity Assessment: LLE deficits/detail LLE Deficits / Details: noted edema       Communication      Cognition Arousal/Alertness: Awake/alert Behavior During Therapy: WFL for tasks assessed/performed Overall Cognitive Status: Within Functional Limits for tasks assessed                      General Comments      Exercises     Assessment/Plan    PT Assessment  Patient needs continued PT services  PT Problem List Decreased strength;Decreased mobility;Decreased knowledge of precautions;Decreased safety awareness;Decreased knowledge of use of DME       PT Treatment Interventions DME instruction;Gait training;Functional mobility training;Therapeutic activities;Patient/family education    PT Goals (Current goals can be found in the Care Plan section)  Acute Rehab PT Goals Patient Stated Goal: to walk more PT Goal Formulation: With patient Time For Goal Achievement: 10/15/16 Potential to Achieve Goals: Good    Frequency Min 3X/week   Barriers to discharge         Co-evaluation               End of Session   Activity Tolerance: Patient tolerated treatment well Patient left: in chair;with chair alarm set;with call bell/phone within reach Nurse Communication: Mobility status PT Visit Diagnosis: Difficulty in walking, not elsewhere classified (R26.2);Muscle weakness (generalized) (M62.81)         Time: 1013-1030 PT Time Calculation (min) (ACUTE ONLY): 17 min   Charges:   PT Evaluation $PT Eval Low Complexity: 1 Procedure     PT G CodesRada Hay 10/01/2016, 10:45 AM  Blanchard Kelch PT [phone]

## 2016-10-01 NOTE — Clinical Social Work Placement (Addendum)
   CLINICAL SOCIAL WORK PLACEMENT  NOTE  Date:  10/01/2016  Patient Details  Name: [name] MRN: [id number] Date of Birth: 1940/09/29  Clinical Social Work is seeking post-discharge placement for this patient at the Skilled  Nursing Facility level of care (*CSW will initial, date and re-position this form in  chart as items are completed):  Yes   Patient/family provided with [facility] Clinical Social Work Department's list of facilities offering this level of care within the geographic area requested by the patient (or if unable, by the patient's family).  Yes   Patient/family informed of their freedom to choose among providers that offer the needed level of care, that participate in Medicare, Medicaid or managed care program needed by the patient, have an available bed and are willing to accept the patient.  Yes   Patient/family informed of [facility]'s ownership interest in [facility] and [facility], as well as of the fact that they are under no obligation to receive care at these facilities.  PASRR submitted to EDS on       PASRR number received on       Existing PASRR number confirmed on 10/01/16     FL2 transmitted to all facilities in geographic area requested by pt/family on 10/01/16     FL2 transmitted to all facilities within larger geographic area on       Patient informed that his/her managed care company has contracts with or will negotiate with certain facilities, including the following:            Patient/family informed of bed offers received.  Patient chooses bed at   [facility]    Physician recommends and patient chooses bed at     [facility] Patient to be transferred to   on  .10/02/2016  Patient to be transferred to facility by  [facility]     Patient family notified on   of transfer. 10/02/2016  Name of family member notified:     Daughter    PHYSICIAN       Additional Comment:     _______________________________________________ Arlyss Repress, LCSW 10/01/2016, 2:20 PM

## 2016-10-01 NOTE — Progress Notes (Signed)
RT placed patient on CPAP. Sterile water added to water chamber. Patient setting is auto 5-20 cmH2O.. Patient tolerating well. Encouraged patient to call if she has any problems.

## 2016-10-01 NOTE — Progress Notes (Signed)
PROGRESS NOTE    [name]  RUE:[id number]  DOB: 06-04-1941  DOA: 09/28/2016 PCP: Dorrene German, MD  Hospital course: [name] is a 76 y.o. woman with a history of HTN, HLD, OSA on CPAP qHS, asthma, chronic pain, and depression who lives with her niece.  She was reportedly in her baseline state of health until two days ago.  Since then, she has demonstrated increased confusion, lethargy, and fatigue.  Pt found to have acute bilateral PE with right heart strain.  Pt had hypertensive emergency and started on cardene drip.  Pt was transferred to SDU and PCCM was consulted.     Assessment & Plan:  Acute encephalopathy, multifactorial causes given influenza, bilateral PE and hypertensive emergency --Resolved now with supportive therapy --Blood cultures NGTD --Flu B test positive. --TSH 0.177, adding free T4 and T3, TSH was low 2 years ago, pt does not remember ever being diagnosed with hyperthyroidism  Bilateral PE with right heart strain and presumed DVT LLE --Transition to oral eliquis 2/20 with assistance from pharmacists, medication education started --Venous dopplers positive for DVT LLE.     Left knee and leg pain with new swelling / DVT LLE --acute DVT post left knee surgery  Chronic pain --Continue fentanyl patch (due to be changed tomorrow) but will minimize oral narcotics until her mental status improves --Added laxatives to regimen  HTN, still suboptimally controlled --Resumed home lisinopril, add amlodipine and coreg (could not tolerate beta blockers)  --added oral hydralazine 2/19.  DC'd coreg.  Add HCTZ 12.5 mg 2/20, increased amlodipine to 10 mg 2/20.  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  Pt says that she has been aware of this and it has been followed by her PCP Dr. Concepcion Elk with semi-annual scans.  Recommend semi-annual imaging followup by CTA or MRA and referral to cardiothoracic surgery if  not already obtained.  Plan to refer to CT surgery at discharge.    HLD --Statin  OSA --CPAP per home regimen  Mood disorder NOS/depression --Abilify, Buspar, Pristiq, Aricept  GERD --PPI  DVT prophylaxis: Lovenox Code Status: FULL Family Communication: Son and daughter-in-law present in the ED at time of admission. Disposition Plan: SNF Rehab, consulted CSW, anticipate discharge to SNF 2/21  Consultants:  PCCM  Subjective: Pt is awake, alert, NAD, she has been minimally ambulating  Objective: Vitals:   09/30/16 2133 10/01/16 0547 10/01/16 0647 10/01/16 0930  BP: 135/64 (!) 196/69 (!) 152/79 (!) 177/70  Pulse: (!) 53 (!) 53 (!) 49   Resp: 20 20    Temp: 98.4 F (36.9 C) 97.5 F (36.4 C)    TempSrc: Oral Oral    SpO2: 98% 97%    Weight:      Height:        Intake/Output Summary (Last 24 hours) at 10/01/16 1011 Last data filed at 10/01/16 0656  Gross per 24 hour  Intake            796.5 ml  Output             1400 ml  Net           -603.5 ml   Filed Weights   09/28/16 1643 09/28/16 2057 09/29/16 0345  Weight: 102.1 kg (225 lb) 90.6 kg (199 lb 11.8 oz) 90.6 kg (199 lb 11.8 oz)    Exam:  General exam: awake, alert, NAD, flat affect much improved. Respiratory system: Clear. No increased work  of breathing. Cardiovascular system: S1 & S2 heard.  Gastrointestinal system: Abdomen is nondistended, soft and nontender. Normal bowel sounds heard. Central nervous system: Alert and oriented. No focal neurological deficits. Extremities: swollen LLE with cords felt in left calf  Data Reviewed: Basic Metabolic Panel:  Recent Labs Lab 09/28/16 1700 09/29/16 0819 09/30/16 0530 10/01/16 0518  NA 139 139 137 139  K 3.5 3.1* 3.6 4.0  CL 100* 104 105 106  CO2 30 26 26 27   GLUCOSE 89 115* 92 99  BUN 12 10 16 13   CREATININE 0.66 0.68 0.65 0.56  CALCIUM 9.9 8.8* 8.6* 9.1   Liver Function Tests:  Recent Labs Lab 09/28/16 1700 09/30/16 0530 10/01/16 0518    AST 26 23 24   ALT 16 16 17   ALKPHOS 46 33* 34*  BILITOT 1.3* 0.9 1.0  PROT 8.0 6.5 6.5  ALBUMIN 4.4 3.4* 3.3*   No results for input(s): LIPASE, AMYLASE in the last 168 hours. No results for input(s): AMMONIA in the last 168 hours. CBC:  Recent Labs Lab 09/28/16 1700 09/29/16 0819 09/30/16 0530 10/01/16 0518  WBC 7.6 8.0 5.4 4.5  HGB 14.1 12.7 11.9* 12.4  HCT 42.7 38.5 37.0 38.4  MCV 93.8 93.2 94.1 95.3  PLT 156 143* 126* 130*   Cardiac Enzymes:  Recent Labs Lab 09/29/16 0819  TROPONINI <0.03   CBG (last 3)  No results for input(s): GLUCAP in the last 72 hours. Recent Results (from the past 240 hour(s))  Culture, blood (routine x 2)     Status: None (Preliminary result)   Collection Time: 09/28/16  7:55 PM  Result Value Ref Range Status   Specimen Description BLOOD LEFT ARM  Final   Special Requests BOTTLES DRAWN AEROBIC AND ANAEROBIC 5CC  Final   Culture   Final    NO GROWTH 1 DAY Performed at [facility], [address], [place], [place] [address]    Report Status PENDING  Incomplete  Culture, blood (routine x 2)     Status: None (Preliminary result)   Collection Time: 09/28/16  7:56 PM  Result Value Ref Range Status   Specimen Description BLOOD RIGHT ARM  Final   Special Requests BOTTLES DRAWN AEROBIC AND ANAEROBIC 5CC  Final   Culture   Final    NO GROWTH 1 DAY Performed at [facility], [address], [place], [place] [address]    Report Status PENDING  Incomplete  MRSA PCR Screening     Status: None   Collection Time: 09/29/16  3:43 AM  Result Value Ref Range Status   MRSA by PCR NEGATIVE NEGATIVE Final    Comment:        The GeneXpert MRSA Assay (FDA approved for NASAL specimens only), is one component of a comprehensive MRSA colonization surveillance program. It is not intended to diagnose MRSA infection nor to guide or monitor treatment for MRSA infections.      Studies: No results found. Scheduled Meds: . amLODipine   10 mg Oral Daily  . apixaban  10 mg Oral BID   Followed by  . [START ON 10/08/2016] apixaban  5 mg Oral BID  . ARIPiprazole  5 mg Oral Daily  . atorvastatin  20 mg Oral Daily  . busPIRone  30 mg Oral BID  . donepezil  10 mg Oral QHS  . feeding supplement (ENSURE ENLIVE)  237 mL Oral BID BM  . fentaNYL  75 mcg Transdermal Q72H  . hydrALAZINE  10  mg Oral Q8H  . hydrochlorothiazide  12.5 mg Oral Daily  . lisinopril  40 mg Oral Daily  . oseltamivir  75 mg Oral BID  . pantoprazole  40 mg Oral Daily  . prednisoLONE acetate  1 drop Both Eyes Q2H while awake  . senna-docusate  1 tablet Oral BID  . venlafaxine XR  75 mg Oral Q breakfast   Continuous Infusions:  Principal Problem:   Acute encephalopathy Active Problems:   Status post total left knee replacement   Left knee pain   Left leg swelling   Hilar mass   OSA on CPAP   HTN (hypertension)   HLD (hyperlipidemia)   Chronic pain   Acute pulmonary embolism (HCC)  Time spent: 22 mins  Standley Dakins, MD, FAAFP [facility] Pager [phone]  If 7PM-7AM, please contact night-coverage [email or web address] Password [email or web address] 10/01/2016, 10:11 AM    LOS: 2 days

## 2016-10-01 NOTE — Progress Notes (Signed)
Pt refused cpap for tonight.  Pt was advised that RT is available all night and encouraged her to call should she change her mind.  RN aware.

## 2016-10-02 DIAGNOSIS — I2602 Saddle embolus of pulmonary artery with acute cor pulmonale: Secondary | ICD-10-CM

## 2016-10-02 DIAGNOSIS — R918 Other nonspecific abnormal finding of lung field: Secondary | ICD-10-CM

## 2016-10-02 DIAGNOSIS — I10 Essential (primary) hypertension: Secondary | ICD-10-CM

## 2016-10-02 DIAGNOSIS — E785 Hyperlipidemia, unspecified: Secondary | ICD-10-CM

## 2016-10-02 DIAGNOSIS — G8929 Other chronic pain: Secondary | ICD-10-CM

## 2016-10-02 DIAGNOSIS — G9340 Encephalopathy, unspecified: Secondary | ICD-10-CM

## 2016-10-02 DIAGNOSIS — M25562 Pain in left knee: Secondary | ICD-10-CM

## 2016-10-02 LAB — T3: T3 TOTAL: 106 ng/dL (ref 71–180)

## 2016-10-02 LAB — CBC
HEMATOCRIT: 38.4 % (ref 36.0–46.0)
Hemoglobin: 12.2 g/dL (ref 12.0–15.0)
MCH: 30.4 pg (ref 26.0–34.0)
MCHC: 31.8 g/dL (ref 30.0–36.0)
MCV: 95.8 fL (ref 78.0–100.0)
Platelets: 145 10*3/uL — ABNORMAL LOW (ref 150–400)
RBC: 4.01 MIL/uL (ref 3.87–5.11)
RDW: 14.5 % (ref 11.5–15.5)
WBC: 4.2 10*3/uL (ref 4.0–10.5)

## 2016-10-02 LAB — COMPREHENSIVE METABOLIC PANEL
ALBUMIN: 3.6 g/dL (ref 3.5–5.0)
ALT: 19 U/L (ref 14–54)
AST: 27 U/L (ref 15–41)
Alkaline Phosphatase: 35 U/L — ABNORMAL LOW (ref 38–126)
Anion gap: 7 (ref 5–15)
BUN: 19 mg/dL (ref 6–20)
CHLORIDE: 101 mmol/L (ref 101–111)
CO2: 31 mmol/L (ref 22–32)
Calcium: 9.4 mg/dL (ref 8.9–10.3)
Creatinine, Ser: 0.76 mg/dL (ref 0.44–1.00)
GFR calc Af Amer: >60 mL/min (ref >60)
GFR calc non Af Amer: >60 mL/min (ref >60)
GLUCOSE: 95 mg/dL (ref 65–99)
POTASSIUM: 4.6 mmol/L (ref 3.5–5.1)
SODIUM: 139 mmol/L (ref 135–145)
Total Bilirubin: 0.8 mg/dL (ref 0.3–1.2)
Total Protein: 7.2 g/dL (ref 6.5–8.1)

## 2016-10-02 MED ORDER — APIXABAN 5 MG PO TABS: 10.0000 mg | ORAL_TABLET | Freq: Two times a day (BID) | ORAL | Status: DC

## 2016-10-02 MED ORDER — HYDROCHLOROTHIAZIDE 12.5 MG PO CAPS: 12.5000 mg | ORAL_CAPSULE | Freq: Every day | ORAL | Status: DC

## 2016-10-02 MED ORDER — OSELTAMIVIR PHOSPHATE 75 MG PO CAPS: 75.0000 mg | ORAL_CAPSULE | Freq: Two times a day (BID) | ORAL | Status: AC

## 2016-10-02 MED ORDER — FENTANYL 75 MCG/HR TD PT72
75.0000 ug | MEDICATED_PATCH | TRANSDERMAL | 0 refills | Status: DC
Start: 2016-10-02 — End: 2017-06-24

## 2016-10-02 MED ORDER — IPRATROPIUM-ALBUTEROL 0.5-2.5 (3) MG/3ML IN SOLN: 3.0000 mL | RESPIRATORY_TRACT | Status: DC | PRN

## 2016-10-02 MED ORDER — APIXABAN 5 MG PO TABS: 5.0000 mg | ORAL_TABLET | Freq: Two times a day (BID) | ORAL | Status: DC

## 2016-10-02 MED ORDER — SENNOSIDES-DOCUSATE SODIUM 8.6-50 MG PO TABS: 1.0000 | ORAL_TABLET | Freq: Two times a day (BID) | ORAL | Status: DC

## 2016-10-02 MED ORDER — AMLODIPINE BESYLATE 10 MG PO TABS: 10.0000 mg | ORAL_TABLET | Freq: Every day | ORAL | Status: DC

## 2016-10-02 MED ORDER — OXYCODONE HCL 15 MG PO TABS: 15.0000 mg | ORAL_TABLET | Freq: Three times a day (TID) | ORAL | 0 refills | Status: DC | PRN

## 2016-10-02 MED ORDER — FUROSEMIDE 40 MG PO TABS: 40.0000 mg | ORAL_TABLET | Freq: Every day | ORAL | Status: DC | PRN

## 2016-10-02 NOTE — Progress Notes (Signed)
Occupational Therapy Treatment Patient Details Name: [name] MRN: [id number] DOB: 11-May-1941 Today's Date: 10/02/2016    History of present illness [name] is a 76 y.o. woman with a history of HTN, HLD, OSA on CPAP qHS, asthma, chronic pain, and depression who lives with her niece.  Two days of increased confusion, lethargy, and fatigue.  Pt found to have acute bilateral PE with right heart strain. Positive left leg DVT.   OT comments  Pt now thinking of going to rehab per sons request  Follow Up Recommendations  Home health OT    Equipment Recommendations  None recommended by OT       Precautions / Restrictions Precautions Precautions: Fall Restrictions Weight Bearing Restrictions: No       Mobility Bed Mobility Overal bed mobility: Needs Assistance Bed Mobility: Supine to Sit       Sit to supine: Min assist      Transfers Overall transfer level: Needs assistance Equipment used: Rolling walker (2 wheeled) Transfers: Sit to/from [employer] Sit to Stand: Min assist Stand pivot transfers: Min assist       General transfer comment: pt was fatigued from being in chair        ADL Overall ADL's : Needs assistance/impaired     Grooming: Min guard;Standing;Brushing hair;Wash/dry Engineer, manufacturing systems Transfer: Minimal assistance;RW;Ambulation;Comfort height toilet   Toileting- Clothing Manipulation and Hygiene: Minimal assistance;Sit to/from stand;Cueing for safety;Cueing for compensatory techniques                      Praxis      Cognition   Behavior During Therapy: [facility] for tasks assessed/performed Overall Cognitive Status: Within Functional Limits for tasks assessed                                Prior Functioning/Environment              Frequency  Min 2X/week        Progress Toward Goals  OT Goals(current goals can now be found in the care plan section)  Progress  towards OT goals: Progressing toward goals     Plan Discharge plan remains appropriate    Co-evaluation                 End of Session Equipment Utilized During Treatment: Rolling walker  OT Visit Diagnosis: Unsteadiness on feet (R26.81);Muscle weakness (generalized) (M62.81)   Activity Tolerance Patient limited by fatigue;Patient tolerated treatment well   Patient Left in bed;with bed alarm set   Nurse Communication Mobility status        Time: 3086-5784 OT Time Calculation (min): 17 min  Charges: OT General Charges $OT Visit: 1 Procedure OT Treatments $Self Care/Home Management : 8-22 mins  [place], [place] [phone]   Einar Crow D 10/02/2016, 10:35 AM

## 2016-10-02 NOTE — Progress Notes (Signed)
LCSWA scheduled patient [facility] transport for 1:45pm. Nurse given report number.   [name], [name], MSW Clinical Social Worker 5E and Psychiatric Service Line [phone] 10/02/2016  12:49 PM

## 2016-10-02 NOTE — Progress Notes (Signed)
Plan for d/c to SNF, discharge planning per CSW. [phone] 

## 2016-10-02 NOTE — Discharge Summary (Signed)
Physician Discharge Summary  [name] LKG:[id number] DOB: 1940-12-20 DOA: 09/28/2016  PCP: Dorrene German, MD  Admit date: 09/28/2016 Discharge date: 10/02/2016  Time spent: 25* minutes  Recommendations for Outpatient Follow-up:   1. Follow-up CT surgery in 2 weeks to check on ascending thoracic aortic aneurysm 2. Started on Eliquis 10 mg twice a day daily until 10/07/2016, then start Eliquis 5 mg twice a day from 10/08/2016 3. Continue Tamiflu 75 mg twice a day for 1 more day stop after 10/03/16    Discharge Diagnoses:  Principal Problem:   Acute encephalopathy Active Problems:   Status post total left knee replacement   Left knee pain   Left leg swelling   Hilar mass   OSA on CPAP   HTN (hypertension)   HLD (hyperlipidemia)   Chronic pain   Acute pulmonary embolism [name])   Discharge Condition: Stable  Diet recommendation: Heart healthy diet  Filed Weights   09/28/16 1643 09/28/16 2057 09/29/16 0345  Weight: 102.1 kg (225 lb) 90.6 kg (199 lb 11.8 oz) 90.6 kg (199 lb 11.8 oz)    History of present illness:  76 y.o.woman with a history of HTN, HLD, OSA on CPAP qHS, asthma, chronic pain, and depression who lives with her niece. She was reportedly in her baseline state of health until two days ago. Since then, she has demonstrated increased confusion, lethargy, and fatigue.  Pt found to have acute bilateral PE with right heart strain.  Pt had hypertensive emergency and started on cardene drip  Hospital Course:   Acute encephalopathy- resolved, likely multifactorial from influenza, bilateral pulmonary embolism, hypertensive emergency. Influenza B was positive patient started on Tamiflu stop after 10/03/2016. TSH 0.177, T3 106, T4 1.02. Blood cultures negative to date.  Bilateral pulmonary embolism with right heart strain- venous Dopplers positive for DVT in left lower extremity. Patient started on eliquis 10 mg by mouth twice a day for 1 week and then switched to 5  mg twice a day from 10/08/16.  Left lower extremity pain/swelling- secondary to acute DVT. Continue Lasix when necessary. Fentanyl patch.  Chronic pain syndrome- pain is well controlled, continue fentanyl patch, oxycodone when necessary  Hypertension- beta blockers were discontinued because of bradycardia. Continue HCTZ 12.5 mg daily, amlodipine 10 milligrams by mouth daily  Ascending thoracic aortic aneurysm- CTA shows ascending thoracic aortic aneurysm at 4.5 cm. Recommend semiannual imaging by CTA or MRA. I've called CT surgery Dr Dorris Fetch and patient has an outpatient appointment. They will call the patient to confirm the appointment    Procedures:  Bilateral venous duplex of lower extremity  Consultations:  None   Discharge Exam: Vitals:   10/02/16 0520 10/02/16 1008  BP: (!) 144/65 139/74  Pulse: (!) 43   Resp: 16   Temp: 98 F (36.7 C)     General: Appears in no acute distress Cardiovascular: RRR, S1-S2 Respiratory: Clear to auscultation bilaterally Extremities- trace edema of left lower extremity  Discharge Instructions   Discharge Instructions    Diet - low sodium heart healthy    Complete by:  As directed    Increase activity slowly    Complete by:  As directed      Current Discharge Medication List    START taking these medications   Details  amLODipine (NORVASC) 10 MG tablet Take 1 tablet (10 mg total) by mouth daily.    !! apixaban (ELIQUIS) 5 MG TABS tablet Take 2 tablets (10 mg total) by mouth 2 (two) times daily.  Qty: 60 tablet    !! apixaban (ELIQUIS) 5 MG TABS tablet Take 1 tablet (5 mg total) by mouth 2 (two) times daily. Qty: 60 tablet    hydrochlorothiazide (MICROZIDE) 12.5 MG capsule Take 1 capsule (12.5 mg total) by mouth daily.    ipratropium-albuterol (DUONEB) 0.5-2.5 (3) MG/3ML SOLN Take 3 mLs by nebulization every 4 (four) hours as needed (shortness of breath). Qty: 360 mL    oseltamivir (TAMIFLU) 75 MG capsule Take 1 capsule  (75 mg total) by mouth 2 (two) times daily. Qty: 2 capsule    senna-docusate (SENOKOT-S) 8.6-50 MG tablet Take 1 tablet by mouth 2 (two) times daily.     !! - Potential duplicate medications found. Please discuss with provider.    CONTINUE these medications which have CHANGED   Details  fentaNYL (DURAGESIC - DOSED MCG/HR) 75 MCG/HR Place 1 patch (75 mcg total) onto the skin every 3 (three) days. Qty: 5 patch, Refills: 0    furosemide (LASIX) 40 MG tablet Take 1 tablet (40 mg total) by mouth daily as needed for edema. Qty: 30 tablet    oxyCODONE (ROXICODONE) 15 MG immediate release tablet Take 1 tablet (15 mg total) by mouth 3 (three) times daily as needed for pain. Qty: 30 tablet, Refills: 0      CONTINUE these medications which have NOT CHANGED   Details  acetaminophen (TYLENOL) 500 MG tablet Take 500-1,000 mg by mouth every 6 (six) hours as needed for mild pain, moderate pain or headache.     albuterol (PROVENTIL HFA;VENTOLIN HFA) 108 (90 Base) MCG/ACT inhaler Inhale 1-2 puffs into the lungs every 4 (four) hours as needed for wheezing or shortness of breath.    alendronate (FOSAMAX) 70 MG tablet Take 70 mg by mouth every Sunday. Take with a full glass of water on an empty stomach.    ARIPiprazole (ABILIFY) 5 MG tablet Take 5 mg by mouth daily.  Refills: 0    aspirin EC 325 MG tablet Take 325 mg by mouth daily.    atorvastatin (LIPITOR) 20 MG tablet Take 20 mg by mouth daily.     busPIRone (BUSPAR) 30 MG tablet Take 30 mg by mouth 2 (two) times daily. Refills: 0    desvenlafaxine (PRISTIQ) 50 MG 24 hr tablet Take 50 mg by mouth daily.     donepezil (ARICEPT) 10 MG tablet Take 10 mg by mouth at bedtime.     fexofenadine (ALLEGRA) 180 MG tablet Take 180 mg by mouth daily as needed for allergies.     lisinopril (PRINIVIL,ZESTRIL) 40 MG tablet Take 40 mg by mouth daily.     meclizine (ANTIVERT) 25 MG tablet Take 25 mg by mouth every 6 (six) hours as needed for dizziness.      methocarbamol (ROBAXIN) 500 MG tablet Take 1 tablet (500 mg total) by mouth every 6 (six) hours as needed for muscle spasms. Qty: 30 tablet, Refills: 0    Multiple Vitamin (MULTIVITAMIN WITH MINERALS) TABS tablet Take 1 tablet by mouth daily.     Olopatadine HCl (PATADAY) 0.2 % SOLN Place 1 drop into both eyes daily as needed (for allergies).    omeprazole (PRILOSEC) 20 MG capsule Take 20 mg by mouth daily. Refills: 0    potassium chloride (KLOR-CON 10) 10 MEQ tablet Take 10 mEq by mouth daily as needed (when taking Lasix).     prednisoLONE acetate (PRED FORTE) 1 % ophthalmic suspension Place 1 drop into both eyes every 2 (two) hours while awake.  STOP taking these medications     hydrochlorothiazide (HYDRODIURIL) 25 MG tablet        Allergies  Allergen Reactions  . Codeine Itching   Follow-up Information    Loreli Slot, MD Follow up in 2 week(s).   Specialty:  Cardiothoracic Surgery Contact information: [address] [place] [address] [phone]            The results of significant diagnostics from this hospitalization (including imaging, microbiology, ancillary and laboratory) are listed below for reference.    Significant Diagnostic Studies: Dg Chest 2 View  Result Date: 09/28/2016 CLINICAL DATA:  76 year old female with history of sepsis. Lethargy and fever. Knee swelling and pain since yesterday. Former smoker. EXAM: CHEST  2 VIEW COMPARISON:  Chest x-ray a 02/29/2016. FINDINGS: Lung volumes are low. There appears to be a right sided hilar mass on the frontal projection, although cardiomediastinal contours are grossly distorted by patient's rotation to the right. This could conceivably reflect central airspace consolidation, but this is not favored. Increased density over the hilar regions is noted on the lateral projection as well. No pleural effusions. No evidence of pulmonary edema. Heart size appears mildly enlarged. Aortic  atherosclerosis. IMPRESSION: 1. The appearance of the chest is concerning for potential right-sided hilar malignancy. Conceivably, these findings could be related to a central area of airspace consolidation in the right lung, but this is not favored. Further evaluation with contrast enhanced chest CT is recommended at this time to better evaluate these findings. 2. Aortic atherosclerosis. 3. Cardiomegaly. Electronically Signed   By: Trudie Reed M.D.   On: 09/28/2016 17:48   Ct Head Wo Contrast  Result Date: 09/28/2016 CLINICAL DATA:  Acute onset of generalized weakness, fever, fatigue and knee pain. Cough. Altered mental status. Initial encounter. EXAM: CT HEAD WITHOUT CONTRAST TECHNIQUE: Contiguous axial images were obtained from the base of the skull through the vertex without intravenous contrast. COMPARISON:  CT of the head performed 07/18/2016 FINDINGS: Brain: No evidence of acute infarction, hemorrhage, hydrocephalus, extra-axial collection or mass lesion/mass effect. Prominence of the ventricles and sulci reflects mild to moderate cortical volume loss. Mild cerebellar atrophy is noted. Scattered periventricular white matter change likely reflects small vessel ischemic microangiopathy. The brainstem and fourth ventricle are within normal limits. The basal ganglia are unremarkable in appearance. The cerebral hemispheres demonstrate grossly normal gray-white differentiation. No mass effect or midline shift is seen. Vascular: No hyperdense vessel or unexpected calcification. Skull: There is no evidence of fracture; visualized osseous structures are unremarkable in appearance. Sinuses/Orbits: The visualized portions of the orbits are within normal limits. The paranasal sinuses and mastoid air cells are well-aerated. Other: No significant soft tissue abnormalities are seen. IMPRESSION: 1. No acute intracranial pathology seen on CT. 2. Mild to moderate cortical volume loss and scattered small vessel  ischemic microangiopathy. Electronically Signed   By: Roanna Raider M.D.   On: 09/28/2016 18:38   Ct Chest W Contrast  Result Date: 09/29/2016 CLINICAL DATA:  Prominent right hilum, rule out mass EXAM: CT CHEST WITH CONTRAST TECHNIQUE: Multidetector CT imaging of the chest was performed during intravenous contrast administration. CONTRAST:  75mL ISOVUE-300 IOPAMIDOL (ISOVUE-300) INJECTION 61% COMPARISON:  09/28/2016 CXR FINDINGS: Cardiovascular: There are acute bilateral multilobar pulmonary emboli contributing to prominence of the pulmonary hilum with right heart strain. RV/LV ratio is 1.26. The aorta is aneurysmal the 4.5 cm along the ascending portion with tapering at the aortic arch. No descending thoracic aortic aneurysm. Mediastinum/Nodes: No  enlarged mediastinal, hilar, or axillary lymph nodes. Thyroid gland, trachea, and esophagus demonstrate no significant findings. Lungs/Pleura: There is minimal atelectasis posteriorly in the lower lobes. No effusion or pneumothorax. No dominant mass. Upper Abdomen: No acute abnormality. Musculoskeletal: Chronic superior endplate compression of T8. 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 IMPRESSION: 1. Positive for acute PE with CT evidence of right heart strain (RV/LV Ratio = 1.26) consistent with at least submassive (intermediate risk) PE. The presence of right heart strain has been associated with an increased risk of morbidity and mortality. Please activate Code PE by paging [phone]. Critical Value/emergent results were called by telephone at the time of interpretation on 09/29/2016 at 1:52 am to Dr. Michael Litter , who verbally acknowledged these results. 2. 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 Recommend semi-annual imaging followup by CTA or MRA and referral to cardiothoracic surgery if not already obtained. This recommendation follows 2010 ACCF/AHA/AATS/ACR/ASA/SCA/SCAI/SIR/STS/SVM Guidelines for the Diagnosis and Management of Patients With  Thoracic Aortic Disease. Circulation. 2010; 121: S239-R320 3. No pulmonary mass. Electronically Signed   By: Tollie Eth M.D.   On: 09/29/2016 01:52   Dg Knee Complete 4 Views Left  Result Date: 09/28/2016 CLINICAL DATA:  Acute onset of left knee pain.  Initial encounter. EXAM: LEFT KNEE - COMPLETE 4+ VIEW COMPARISON:  None. FINDINGS: The patient's total knee arthroplasty is grossly unremarkable in appearance, without evidence of loosening or fracture. A small knee joint effusion is noted. Edema is noted at Hoffa's fat pad. Mild soft tissue swelling is noted about the knee. IMPRESSION: 1. No evidence of fracture or dislocation. Total knee arthroplasty is unremarkable in appearance, without evidence of loosening. 2. Small knee joint effusion noted. Edema at Hoffa's fat pad. Mild soft tissue swelling about the knee. Electronically Signed   By: Roanna Raider M.D.   On: 09/28/2016 20:15    Microbiology: Recent Results (from the past 240 hour(s))  Culture, blood (routine x 2)     Status: None (Preliminary result)   Collection Time: 09/28/16  7:55 PM  Result Value Ref Range Status   Specimen Description BLOOD LEFT ARM  Final   Special Requests BOTTLES DRAWN AEROBIC AND ANAEROBIC 5CC  Final   Culture   Final    NO GROWTH 3 DAYS Performed at [facility], [address], [place], [place] [address]    Report Status PENDING  Incomplete  Culture, blood (routine x 2)     Status: None (Preliminary result)   Collection Time: 09/28/16  7:56 PM  Result Value Ref Range Status   Specimen Description BLOOD RIGHT ARM  Final   Special Requests BOTTLES DRAWN AEROBIC AND ANAEROBIC 5CC  Final   Culture   Final    NO GROWTH 3 DAYS Performed at [facility], [address] [place], [place] [address]    Report Status PENDING  Incomplete  MRSA PCR Screening     Status: None   Collection Time: 09/29/16  3:43 AM  Result Value Ref Range Status   MRSA by PCR NEGATIVE NEGATIVE Final    Comment:         The GeneXpert MRSA Assay (FDA approved for NASAL specimens only), is one component of a comprehensive MRSA colonization surveillance program. It is not intended to diagnose MRSA infection nor to guide or monitor treatment for MRSA infections.      Labs: Basic Metabolic Panel:  Recent Labs Lab 09/28/16 1700 09/29/16 0819 09/30/16 0530 10/01/16 0518 10/02/16 0603  NA  139 139 137 139 139  K 3.5 3.1* 3.6 4.0 4.6  CL 100* 104 105 106 101  CO2 30 26 26 27 31   GLUCOSE 89 115* 92 99 95  BUN 12 10 16 13 19   CREATININE 0.66 0.68 0.65 0.56 0.76  CALCIUM 9.9 8.8* 8.6* 9.1 9.4   Liver Function Tests:  Recent Labs Lab 09/28/16 1700 09/30/16 0530 10/01/16 0518 10/02/16 0603  AST 26 23 24 27   ALT 16 16 17 19   ALKPHOS 46 33* 34* 35*  BILITOT 1.3* 0.9 1.0 0.8  PROT 8.0 6.5 6.5 7.2  ALBUMIN 4.4 3.4* 3.3* 3.6   No results for input(s): LIPASE, AMYLASE in the last 168 hours. No results for input(s): AMMONIA in the last 168 hours. CBC:  Recent Labs Lab 09/28/16 1700 09/29/16 0819 09/30/16 0530 10/01/16 0518 10/02/16 0801  WBC 7.6 8.0 5.4 4.5 4.2  HGB 14.1 12.7 11.9* 12.4 12.2  HCT 42.7 38.5 37.0 38.4 38.4  MCV 93.8 93.2 94.1 95.3 95.8  PLT 156 143* 126* 130* 145*   Cardiac Enzymes:  Recent Labs Lab 09/29/16 0819  TROPONINI <0.03       Signed:  Meredeth Ide MD.  [facility] 10/02/2016, 11:22 AM

## 2016-10-02 NOTE — Progress Notes (Signed)
Report called to Charlene at [facility], IVs removed. [facility] to transport pt to facility, patient stable at time of discharge.

## 2016-10-04 LAB — CULTURE, BLOOD (ROUTINE X 2)
Culture: NO GROWTH
Culture: NO GROWTH

## 2016-10-16 ENCOUNTER — Encounter: Payer: Medicare Other | Admitting: Surgery

## 2016-10-17 NOTE — Progress Notes (Signed)
This encounter was created in error - please disregard.

## 2016-11-18 ENCOUNTER — Ambulatory Visit (INDEPENDENT_AMBULATORY_CARE_PROVIDER_SITE_OTHER): Payer: Medicare Other | Admitting: Podiatry

## 2016-11-18 ENCOUNTER — Encounter: Payer: Self-pay | Admitting: Podiatry

## 2016-11-18 DIAGNOSIS — L608 Other nail disorders: Secondary | ICD-10-CM

## 2016-11-18 DIAGNOSIS — M79609 Pain in unspecified limb: Secondary | ICD-10-CM

## 2016-11-18 DIAGNOSIS — L84 Corns and callosities: Secondary | ICD-10-CM

## 2016-11-18 DIAGNOSIS — B351 Tinea unguium: Secondary | ICD-10-CM | POA: Diagnosis not present

## 2016-11-18 DIAGNOSIS — M79671 Pain in right foot: Secondary | ICD-10-CM

## 2016-11-18 DIAGNOSIS — L603 Nail dystrophy: Secondary | ICD-10-CM

## 2016-11-18 DIAGNOSIS — M79672 Pain in left foot: Secondary | ICD-10-CM

## 2016-11-18 DIAGNOSIS — L851 Acquired keratosis [keratoderma] palmaris et plantaris: Secondary | ICD-10-CM

## 2016-11-18 NOTE — Progress Notes (Signed)
SUBJECTIVE Patient  presents to office today complaining of elongated, thickened nails and painful callus lesions of the right foot. Patient states that the pain is ongoing and is affecting their ability to ambulate without pain. Patient is unable to trim their own nails. Patient states that she recently underwent of total knee replacement to the left lower extremity on 05/17/2016.  OBJECTIVE General Patient is awake, alert, and oriented x 3 and in no acute distress. Derm painful hyperkeratotic callus lesion also noted to the distal tuft of the third digit right foot. Skin is dry and supple bilateral. Negative open lesions or macerations. Remaining integument unremarkable. Nails are tender, long, thickened and dystrophic with subungual debris, consistent with onychomycosis, 1-5 bilateral. No signs of infection noted. Hyperkeratotic lesions x 2 present on the right foot. Pain on palpation with a central nucleated core noted. Vasc  DP and PT pedal pulses palpable bilaterally. Temperature gradient within normal limits.  Neuro Epicritic and protective threshold sensation diminished bilaterally.  Musculoskeletal Exam Pain on palpation at the keratotic lesions noted. No symptomatic pedal deformities noted bilateral. Muscular strength within normal limits.  ASSESSMENT 1. Onychodystrophic nails 1-5 bilateral with hyperkeratosis of nails.  2. Onychomycosis of nail due to dermatophyte bilateral 3. Pain in foot bilateral 4. Callus of the right foot 2  PLAN OF CARE 1. Patient evaluated today.  2. Instructed to maintain good pedal hygiene and foot care.  3. Mechanical debridement of nails 1-5 bilaterally performed using a nail nipper. Filed with dremel without incident.  4. Excisional debridement of callus lesion noted to the distal tuft of the third digit right foot  5. Excisional debridement of two keratoic lesions using a chisel blade was performed without incident.  6. Treated area(s) with Salinocaine  and dressed with light dressing. 7. Return to clinic in 3 mos.    Felecia Shelling, DPM [facility]  Dr. Felecia Shelling, DPM    [address]                                        [place], [place] [address]                Office [phone]  Fax [phone]

## 2016-12-04 ENCOUNTER — Institutional Professional Consult (permissible substitution) (INDEPENDENT_AMBULATORY_CARE_PROVIDER_SITE_OTHER): Payer: Medicare Other | Admitting: Surgery

## 2016-12-04 ENCOUNTER — Encounter: Payer: Self-pay | Admitting: Surgery

## 2016-12-04 VITALS — BP 122/70 | HR 64 | Resp 20 | Ht 62.0 in | Wt 197.0 lb

## 2016-12-04 DIAGNOSIS — I712 Thoracic aortic aneurysm, without rupture: Secondary | ICD-10-CM

## 2016-12-04 DIAGNOSIS — I7121 Aneurysm of the ascending aorta, without rupture: Secondary | ICD-10-CM

## 2016-12-09 ENCOUNTER — Encounter: Payer: Self-pay | Admitting: Surgery

## 2016-12-09 NOTE — Progress Notes (Signed)
Cardiothoracic Surgery Consultation  PCP is Dorrene German, MD Referring Provider is [name], MD  Chief Complaint  Patient presents with  . Thoracic Aortic Aneurysm    Surgical eval, Chest CT 09/28/16, ECHO 2/18/1/8    HPI:  The patient is a 76 year old woman with a woman with a history of hypertension, hyperlipidemia, OSA on CPAP, asthma, scoliosis and chronic back pain who was in the hospital in 09/2016 with a LLE DVT and bilateral pulmonary emboli with right heart strain. A CT of the chest showed an incidental 4.5 cm fusiform ascending aortic aneurysm. She was discharged to SNF on Eliquis for her DVT and PE.  Past Medical History:  Diagnosis Date  . Arthritis   . Asthma   . Chronic back pain   . Depression   . Hyperlipemia   . Hypertension   . Scoliosis   . Sleep apnea    cpap - settingsi at 2     Past Surgical History:  Procedure Laterality Date  . ARTHROSCOPIC REPAIR ACL  02/03/2004  . OOPHORECTOMY  1970  . SPINE SURGERY  03/31/2006  . TOTAL KNEE ARTHROPLASTY Left 05/17/2016   Procedure: LEFT TOTAL KNEE ARTHROPLASTY;  Surgeon: Kathryne Hitch, MD;  Location: [facility];  Service: Orthopedics;  Laterality: Left;  Adductor Block; Spinal to General  . TUBAL LIGATION  1983    Family History  Problem Relation Age of Onset  . Other Father     kidney problems  No family history of aneurysms or aortic dissection.  Social History Social History  Substance Use Topics  . Smoking status: Former Smoker    Packs/day: 1.00    Years: 10.00    Types: Cigarettes    Quit date: 08/12/1973  . Smokeless tobacco: Never Used  . Alcohol use No    Current Outpatient Prescriptions  Medication Sig Dispense Refill  . acetaminophen (TYLENOL) 500 MG tablet Take 500-1,000 mg by mouth every 6 (six) hours as needed for mild pain, moderate pain or headache.     . albuterol (PROVENTIL HFA;VENTOLIN HFA) 108 (90 Base) MCG/ACT inhaler Inhale 1-2 puffs into the lungs every 4 (four)  hours as needed for wheezing or shortness of breath.    Marland Kitchen alendronate (FOSAMAX) 70 MG tablet Take 70 mg by mouth every Sunday. Take with a full glass of water on an empty stomach.    Marland Kitchen amLODipine (NORVASC) 10 MG tablet Take 1 tablet (10 mg total) by mouth daily.    Marland Kitchen apixaban (ELIQUIS) 5 MG TABS tablet Take 1 tablet (5 mg total) by mouth 2 (two) times daily. 60 tablet   . ARIPiprazole (ABILIFY) 5 MG tablet Take 5 mg by mouth daily.   0  . aspirin EC 325 MG tablet Take 325 mg by mouth daily.    Marland Kitchen atorvastatin (LIPITOR) 20 MG tablet Take 20 mg by mouth daily.     . busPIRone (BUSPAR) 30 MG tablet Take 30 mg by mouth 2 (two) times daily.  0  . desvenlafaxine (PRISTIQ) 50 MG 24 hr tablet Take 50 mg by mouth daily.     Marland Kitchen donepezil (ARICEPT) 10 MG tablet Take 10 mg by mouth at bedtime.     . fentaNYL (DURAGESIC - DOSED MCG/HR) 75 MCG/HR Place 1 patch (75 mcg total) onto the skin every 3 (three) days. 5 patch 0  . fexofenadine (ALLEGRA) 180 MG tablet Take 180 mg by mouth daily as needed for allergies.     . furosemide (  LASIX) 40 MG tablet Take 1 tablet (40 mg total) by mouth daily as needed for edema. 30 tablet   . hydrochlorothiazide (MICROZIDE) 12.5 MG capsule Take 1 capsule (12.5 mg total) by mouth daily.    Marland Kitchen ipratropium-albuterol (DUONEB) 0.5-2.5 (3) MG/3ML SOLN Take 3 mLs by nebulization every 4 (four) hours as needed (shortness of breath). 360 mL   . lisinopril (PRINIVIL,ZESTRIL) 40 MG tablet Take 40 mg by mouth daily.     . meclizine (ANTIVERT) 25 MG tablet Take 25 mg by mouth every 6 (six) hours as needed for dizziness.     . methocarbamol (ROBAXIN) 500 MG tablet Take 1 tablet (500 mg total) by mouth every 6 (six) hours as needed for muscle spasms. 30 tablet 0  . Multiple Vitamin (MULTIVITAMIN WITH MINERALS) TABS tablet Take 1 tablet by mouth daily.     . Olopatadine HCl (PATADAY) 0.2 % SOLN Place 1 drop into both eyes daily as needed (for allergies).    Marland Kitchen omeprazole (PRILOSEC) 20 MG capsule  Take 20 mg by mouth daily.  0  . oxyCODONE (ROXICODONE) 15 MG immediate release tablet Take 1 tablet (15 mg total) by mouth 3 (three) times daily as needed for pain. 30 tablet 0  . potassium chloride (KLOR-CON 10) 10 MEQ tablet Take 10 mEq by mouth daily as needed (when taking Lasix).     . prednisoLONE acetate (PRED FORTE) 1 % ophthalmic suspension Place 1 drop into both eyes every 2 (two) hours while awake.     . senna-docusate (SENOKOT-S) 8.6-50 MG tablet Take 1 tablet by mouth 2 (two) times daily.     No current facility-administered medications for this visit.     Allergies  Allergen Reactions  . Codeine Itching    Review of Systems  Constitutional:       Inactive due to back pain   Some weight loss  HENT: Negative.   Eyes: Negative.   Respiratory: Negative for cough, chest tightness and shortness of breath.   Cardiovascular: Positive for leg swelling. Negative for chest pain.  Gastrointestinal: Negative.   Endocrine: Negative.   Genitourinary: Negative.   Musculoskeletal: Positive for arthralgias, back pain and gait problem.  Skin: Negative.   Allergic/Immunologic: Negative.   Hematological: Bruises/bleeds easily.  Psychiatric/Behavioral: Negative.     BP 122/70   Pulse 64   Resp 20   Ht 5\' 2"  (1.575 m)   Wt 197 lb (89.4 kg)   SpO2 91% Comment: RA  BMI 36.03 kg/m  Physical Exam  Constitutional: She is oriented to person, place, and time. No distress.  Elderly woman in a wheel chair  HENT:  Head: Normocephalic and atraumatic.  Mouth/Throat: Oropharynx is clear and moist.  Eyes: Conjunctivae and EOM are normal. Pupils are equal, round, and reactive to light.  Neck: Normal range of motion. Neck supple. No JVD present. No thyromegaly present.  Cardiovascular: Normal rate, regular rhythm and normal heart sounds.   No murmur heard. Pulmonary/Chest: Effort normal and breath sounds normal. No respiratory distress. She has no wheezes.  Abdominal: Soft. Bowel sounds  are normal. There is no tenderness.  Musculoskeletal: She exhibits edema.  Lymphadenopathy:    She has no cervical adenopathy.  Neurological: She is alert and oriented to person, place, and time. She has normal strength. No cranial nerve deficit or sensory deficit.  Skin: Skin is warm and dry.  Psychiatric: She has a normal mood and affect.     Diagnostic Tests:  CT CHEST W CONTRAST (Accession  [id number]) (Order [id number])  Imaging  Date: 09/28/2016 Department: [facility] Released By: Cristela Felt, RN (auto-released) Authorizing: Michael Litter, MD  Exam Information   Status Exam Begun  Exam Ended   Final [99] 09/28/2016 11:32 PM 09/28/2016 11:47 PM  PACS Images   Show images for CT CHEST W CONTRAST  Study Result   CLINICAL DATA:  Prominent right hilum, rule out mass  EXAM: CT CHEST WITH CONTRAST  TECHNIQUE: Multidetector CT imaging of the chest was performed during intravenous contrast administration.  CONTRAST:  52mL ISOVUE-300 IOPAMIDOL (ISOVUE-300) INJECTION 61%  COMPARISON:  09/28/2016 CXR  FINDINGS: Cardiovascular: There are acute bilateral multilobar pulmonary emboli contributing to prominence of the pulmonary hilum with right heart strain. RV/LV ratio is 1.26. The aorta is aneurysmal the 4.5 cm along the ascending portion with tapering at the aortic arch. No descending thoracic aortic aneurysm.  Mediastinum/Nodes: No enlarged mediastinal, hilar, or axillary lymph nodes. Thyroid gland, trachea, and esophagus demonstrate no significant findings.  Lungs/Pleura: There is minimal atelectasis posteriorly in the lower lobes. No effusion or pneumothorax. No dominant mass.  Upper Abdomen: No acute abnormality.  Musculoskeletal: Chronic superior endplate compression of T8. 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  IMPRESSION: 1. Positive for acute PE with CT evidence of right heart strain (RV/LV Ratio =  1.26) consistent with at least submassive (intermediate risk) PE. The presence of right heart strain has been associated with an increased risk of morbidity and mortality. Please activate Code PE by paging [phone]. Critical Value/emergent results were called by telephone at the time of interpretation on 09/29/2016 at 1:52 am to Dr. Michael Litter , who verbally acknowledged these results. 2. 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 Recommend semi-annual imaging followup by CTA or MRA and referral to cardiothoracic surgery if not already obtained. This recommendation follows 2010 ACCF/AHA/AATS/ACR/ASA/SCA/SCAI/SIR/STS/SVM Guidelines for the Diagnosis and Management of Patients With Thoracic Aortic Disease. Circulation. 2010; 121: S568-L275 3. No pulmonary mass.   Electronically Signed   By: Tollie Eth M.D.   On: 09/29/2016 01:52     Impression:  She has a 4.5 cm fusiform ascending aortic aneurysm with no sign of aortic dissection. It is unknown how long the aorta has been this size. It is well-below the 5.5 cm surgical threshold but should be followed to establish stability. I reviewed the CT scan with her and her family and answered their questions. I discussed the importance of good blood pressure control.  Plan:  I will see her back in one year with a CTA of the chest.   I spent 30 minutes performing this consultation and > 50% of this time was spent face to face counseling and coordinating the care of this patient's ascending aortic aneurysm.   Alleen Borne, MD [facility] [phone]

## 2017-01-14 ENCOUNTER — Ambulatory Visit (INDEPENDENT_AMBULATORY_CARE_PROVIDER_SITE_OTHER): Payer: Medicare Other | Admitting: Orthopaedic Surgery

## 2017-02-17 ENCOUNTER — Ambulatory Visit: Payer: Medicare Other | Admitting: Podiatry

## 2017-03-10 ENCOUNTER — Ambulatory Visit (INDEPENDENT_AMBULATORY_CARE_PROVIDER_SITE_OTHER): Payer: Medicare Other | Admitting: Podiatry

## 2017-03-10 DIAGNOSIS — L84 Corns and callosities: Secondary | ICD-10-CM | POA: Diagnosis not present

## 2017-03-10 DIAGNOSIS — M79676 Pain in unspecified toe(s): Secondary | ICD-10-CM | POA: Diagnosis not present

## 2017-03-10 DIAGNOSIS — B351 Tinea unguium: Secondary | ICD-10-CM | POA: Diagnosis not present

## 2017-03-10 NOTE — Progress Notes (Signed)
   SUBJECTIVE Patient  presents to office today complaining of elongated, thickened nails. Pain while ambulating in shoes. Patient is unable to trim their own nails.   OBJECTIVE General Patient is awake, alert, and oriented x 3 and in no acute distress. Derm hyperkeratotic callus lesions noted to the right foot 2 Skin is dry and supple bilateral. Negative open lesions or macerations. Remaining integument unremarkable. Nails are tender, long, thickened and dystrophic with subungual debris, consistent with onychomycosis, 1-5 bilateral. No signs of infection noted. Vasc  DP and PT pedal pulses palpable bilaterally. Temperature gradient within normal limits.  Neuro Epicritic and protective threshold sensation diminished bilaterally.  Musculoskeletal Exam No symptomatic pedal deformities noted bilateral. Muscular strength within normal limits.  ASSESSMENT 1. Onychodystrophic nails 1-5 bilateral with hyperkeratosis of nails.  2. Painful callus lesions right plantar foot 2 3. Pain in foot bilateral  PLAN OF CARE 1. Patient evaluated today.  2. Instructed to maintain good pedal hygiene and foot care.  3. Mechanical debridement of nails 1-5 bilaterally performed using a nail nipper. Filed with dremel without incident.  4. Excisional debridement of the callus lesions was performed using a chisel blade without incident or bleeding.  5. Return to clinic in 3 mos.    Felecia Shelling, DPM [facility]  Dr. Felecia Shelling, DPM    [address]                                        [place], [place] [address]                Office [phone]  Fax [phone]

## 2017-04-30 ENCOUNTER — Other Ambulatory Visit: Payer: Self-pay | Admitting: *Deleted

## 2017-04-30 DIAGNOSIS — I712 Thoracic aortic aneurysm, without rupture, unspecified: Secondary | ICD-10-CM

## 2017-06-04 ENCOUNTER — Other Ambulatory Visit: Payer: Medicare Other

## 2017-06-04 ENCOUNTER — Encounter: Payer: Medicare Other | Admitting: Surgery

## 2017-06-10 ENCOUNTER — Ambulatory Visit: Payer: Medicare Other | Admitting: Podiatry

## 2017-06-24 ENCOUNTER — Ambulatory Visit (INDEPENDENT_AMBULATORY_CARE_PROVIDER_SITE_OTHER): Payer: Medicare Other | Admitting: Podiatry

## 2017-06-24 ENCOUNTER — Encounter: Payer: Self-pay | Admitting: Podiatry

## 2017-06-24 DIAGNOSIS — B351 Tinea unguium: Secondary | ICD-10-CM | POA: Diagnosis not present

## 2017-06-24 DIAGNOSIS — M79676 Pain in unspecified toe(s): Secondary | ICD-10-CM | POA: Diagnosis not present

## 2017-06-24 NOTE — Progress Notes (Signed)
Patient ID: [name], female   DOB: [birth year], 76 y.o.   MRN: [id number]   Subjective: Patient presents today complaining of toenails that are long and thick cough walking wearing shoes and request toenail debridement. Patient's son is present in treatment room.  Objective: Orientated 3 DP and PT pulses 2/4 bilaterally Capillary reflex within normal limits bilaterally Sensation to 10 g monofilament wire intact 7/8 right and 8/8 left Vibratory sensation reactive bilaterally Ankle reflex reactive bilaterally No open skin lesions bilaterally The toenails are elongated, brittle, deformed, discolored and tender direct palpation 6-10 Hammertoe 2-5 bilaterally Manual motor testing dorsi flexion, plantar flexion 5/5 bilaterally Patient walks slowly with assistance of cane  Assessment: Satisfactory neurovascular status Symptomatic onychomycoses 6-10  Plan: Debridement of toenails 6-10 mechanically and electrically without any bleeding  Reappoint at patient's request

## 2017-06-25 ENCOUNTER — Encounter: Payer: Medicare Other | Admitting: Surgery

## 2017-06-25 ENCOUNTER — Other Ambulatory Visit: Payer: Medicare Other

## 2017-07-01 ENCOUNTER — Encounter: Payer: Medicare Other | Admitting: Surgery

## 2017-07-23 ENCOUNTER — Other Ambulatory Visit: Payer: Medicare Other

## 2017-07-23 ENCOUNTER — Encounter: Payer: Medicare Other | Admitting: Surgery

## 2017-08-20 ENCOUNTER — Ambulatory Visit
Admission: RE | Admit: 2017-08-20 | Discharge: 2017-08-20 | Disposition: A | Discharge status: Home / Self Care | Payer: Medicare Other | Source: Ambulatory Visit | Attending: Surgery | Admitting: Surgery

## 2017-08-20 ENCOUNTER — Ambulatory Visit (INDEPENDENT_AMBULATORY_CARE_PROVIDER_SITE_OTHER): Payer: Medicare Other | Admitting: Surgery

## 2017-08-20 ENCOUNTER — Other Ambulatory Visit: Payer: Medicare Other

## 2017-08-20 VITALS — BP 160/85 | HR 53 | Resp 20 | Ht 62.0 in | Wt 195.0 lb

## 2017-08-20 DIAGNOSIS — I712 Thoracic aortic aneurysm, without rupture, unspecified: Secondary | ICD-10-CM

## 2017-08-20 MED ORDER — IOPAMIDOL (ISOVUE-370) INJECTION 76%
75.0000 mL | Freq: Once | INTRAVENOUS | Status: AC | PRN
  Administered 2017-08-20: 75 mL via INTRAVENOUS

## 2017-08-21 ENCOUNTER — Encounter: Payer: Self-pay | Admitting: Surgery

## 2017-08-21 NOTE — Progress Notes (Signed)
HPI:  The patient is a 77 year old woman with a woman with a history of hypertension, hyperlipidemia, OSA on CPAP, asthma, scoliosis and chronic back pain who was in the hospital in 09/2016 with a LLE DVT and bilateral pulmonary emboli with right heart strain. A CT of the chest showed an incidental 4.5 cm fusiform ascending aortic aneurysm. She was discharged to SNF on Eliquis for her DVT and PE.  I saw her initially for consultation on 12/04/2016 and she returns today for six-month follow-up.  She denies any chest pain.  Her only complaint is of pain from the mass on her back especially when she lies on top of it.    Current Outpatient Medications  Medication Sig Dispense Refill  . aspirin EC 325 MG tablet Take 325 mg by mouth daily.    Marland Kitchen atorvastatin (LIPITOR) 20 MG tablet Take 20 mg by mouth daily.     . busPIRone (BUSPAR) 30 MG tablet Take 30 mg by mouth 2 (two) times daily.  0  . donepezil (ARICEPT) 10 MG tablet Take 10 mg by mouth at bedtime.     . furosemide (LASIX) 40 MG tablet Take 1 tablet (40 mg total) by mouth daily as needed for edema. 30 tablet   . lisinopril (PRINIVIL,ZESTRIL) 40 MG tablet Take 40 mg by mouth daily.     . memantine (NAMENDA XR) 28 MG CP24 24 hr capsule   0  . Multiple Vitamin (MULTIVITAMIN WITH MINERALS) TABS tablet Take 1 tablet by mouth daily.     . OxyCODONE HCl 15 MG TABA Take 1 tablet by mouth 4 (four) times daily as needed.    . potassium chloride (K-DUR,KLOR-CON) 10 MEQ tablet Take 10 mEq daily by mouth.     No current facility-administered medications for this visit.      Physical Exam: BP (!) 160/85   Pulse (!) 53   Resp 20   Ht 5\' 2"  (1.575 m)   Wt 195 lb (88.5 kg)   SpO2 96% Comment: RA  BMI 35.67 kg/m  Elderly woman in no distress Cardiovascular: Normal rate, regular rhythm and normal heart sounds. No murmur. Pulmonary/Chest: Effort normal and breath sounds normal. No respiratory distress. She has no wheezes. Hard mass mid-back  which is due to spinal hardware with marked kyphosis. Moderate lower extremity edema in lower legs and feet.  Diagnostic Tests:  CLINICAL DATA:  Thoracic aortic aneurysm, follow-up. Currently asymptomatic.  EXAM: CT ANGIOGRAPHY CHEST WITH CONTRAST  TECHNIQUE: Multidetector CT imaging of the chest was performed using the standard protocol during bolus administration of intravenous contrast. Multiplanar CT image reconstructions and MIPs were obtained to evaluate the vascular anatomy.  CONTRAST:  75mL ISOVUE-370 IOPAMIDOL (ISOVUE-370) INJECTION 76%  COMPARISON:  09/28/2016 and previous  FINDINGS: Cardiovascular: Incomplete opacification of the pulmonary arterial tree. Persistent residual filling defect in posterior left upper lobe branch pulmonary artery. The remainder of the bilateral pulmonary emboli seen previously have resolved.  Mild left atrial enlargement. Thoracic aortic aneurysm with transverse measurements as follows:  3.3 cm sinuses of Valsalva  3.2 cm sino-tubular junction  4.2 cm mid ascending  4 cm distal ascending/proximal arch  3 cm distal arch  3 cm proximal descending  2.7 cm distal descending  Scattered calcified plaque throughout. Distal descending segment is moderately tortuous. Visualized proximal abdominal aorta unremarkable.  Mediastinum/Nodes: No enlarged mediastinal, hilar, or axillary lymph nodes. Thyroid gland, trachea, and esophagus demonstrate no significant findings.  Lungs/Pleura: Lungs are clear. No  pleural effusion or pneumothorax.  Upper Abdomen: No acute findings.  Musculoskeletal: Thoracolumbar dextroscoliosis apex T12. Stable mild vertebral compression deformities of T7, T10, T11. Mild T12 compression deformity not included on the prior study. Posterior fixation hardware L1-L3, not visualized distally.  Review of the MIP images confirms the above findings.  IMPRESSION: 1. 4.2 cm ascending aortic  aneurysm without complicating features. Recommend annual imaging followup by CTA or MRA. This recommendation follows 2010 ACCF/AHA/AATS/ACR/ASA/SCA/SCAI/SIR/STS/SVM Guidelines for the Diagnosis and Management of Patients with Thoracic Aortic Disease. Circulation. 2010; 121: T660-A004 2. Significant interval resolution of previously noted bilateral pulmonary emboli with a small residual chronic embolus in the branch to the posterior left upper lobe.   Electronically Signed   By: Corlis Leak M.D.   On: 08/20/2017 13:48  Impression:  Her ascending aortic aneurysm now measures 4.2 cm in maximum diameter compared to her previous scan when it was measured at 4.5 cm.  She has had significant interval resolution of her previously noted bilateral pulmonary emboli with a small residual chronic embolus in the branch of the posterior left upper lobe.  Her aneurysm is well below the usual 5.5 cm surgical threshold and given her chronically debilitated state I do not think she would be a candidate for surgical treatment anyway.  I reviewed the CT images with her and her son and answered their questions.  I told both of them that I do not think that she would be a surgical candidate even if this did enlarge and she said that she would never agree to surgery anyway.  They would like to continue following it with periodic CT scans so that we know what the status of this aneurysm is.  The painful mass on her back is due to the protruding spinal hardware.  She has severe degenerative spine disease and kyphoscoliosis.  Her son has been using a donut shaped pillow to take pressure off this area when she is lying on her back.  This seems to be helping.  Plan:  I will plan to see her back in 1 year with a CT scan of the chest without contrast to follow-up on this ascending aortic aneurysm.  I spent 15 minutes performing this established patient evaluation and > 50% of this time was spent face to face counseling and  coordinating the care of this patient's aortic aneurysm.  Alleen Borne, MD [facility] [phone]

## 2017-12-12 ENCOUNTER — Encounter: Payer: Self-pay | Admitting: Podiatry

## 2017-12-12 ENCOUNTER — Ambulatory Visit (INDEPENDENT_AMBULATORY_CARE_PROVIDER_SITE_OTHER): Payer: Medicare Other | Admitting: Podiatry

## 2017-12-12 DIAGNOSIS — B351 Tinea unguium: Secondary | ICD-10-CM | POA: Diagnosis not present

## 2017-12-12 DIAGNOSIS — L84 Corns and callosities: Secondary | ICD-10-CM

## 2017-12-12 DIAGNOSIS — M79676 Pain in unspecified toe(s): Secondary | ICD-10-CM | POA: Diagnosis not present

## 2017-12-12 NOTE — Progress Notes (Signed)
Complaint:  Visit Type: Patient returns to my office for continued preventative foot care services. Complaint: Patient states" my nails have grown long and thick and become painful to walk and wear shoes" Patient has been diagnosed with DM with no foot complications. The patient presents for preventative foot care services. No changes to ROS.  Patient is  Brought to the office by her son.  Podiatric Exam: Vascular: dorsalis pedis and posterior tibial pulses are palpable bilateral. Capillary return is immediate. Temperature gradient is WNL. Skin turgor WNL  Sensorium: Normal Semmes Weinstein monofilament test. Normal tactile sensation bilaterally. Nail Exam: Pt has thick disfigured discolored nails with subungual debris noted bilateral entire nail hallux through fifth toenails Ulcer Exam: There is no evidence of ulcer or pre-ulcerative changes or infection. Orthopedic Exam: Muscle tone and strength are WNL. No limitations in general ROM. No crepitus or effusions noted. Foot type and digits show no abnormalities. HAV  B/L.  Hammer toes 2-5  B/L. Skin:  Porokeratosis sub 4th right foot. No infection or ulcers  Diagnosis:  Onychomycosis, , Pain in right toe, pain in left toes  Treatment & Plan Procedures and Treatment: Consent by patient was obtained for treatment procedures.   Debridement of mycotic and hypertrophic toenails, 1 through 5 bilateral and clearing of subungual debris. No ulceration, no infection noted. ABN signed for 2019 Debride porokeratosis.  .Return Visit-Office Procedure: Patient instructed to return to the office for a follow up visit 4 months for continued evaluation and treatment.    Helane Gunther DPM

## 2018-04-17 ENCOUNTER — Encounter: Payer: Self-pay | Admitting: Podiatry

## 2018-04-17 ENCOUNTER — Ambulatory Visit (INDEPENDENT_AMBULATORY_CARE_PROVIDER_SITE_OTHER): Payer: Medicare Other | Admitting: Podiatry

## 2018-04-17 DIAGNOSIS — M79676 Pain in unspecified toe(s): Secondary | ICD-10-CM | POA: Diagnosis not present

## 2018-04-17 DIAGNOSIS — B351 Tinea unguium: Secondary | ICD-10-CM | POA: Diagnosis not present

## 2018-04-17 NOTE — Progress Notes (Signed)
Complaint:  Visit Type: Patient returns to my office for continued preventative foot care services. Complaint: Patient states" my nails have grown long and thick and become painful to walk and wear shoes" Patient has been diagnosed with DM with no foot complications. The patient presents for preventative foot care services. No changes to ROS.  Patient is  Brought to the office by her son.  Podiatric Exam: Vascular: dorsalis pedis and posterior tibial pulses are palpable bilateral. Capillary return is immediate. Temperature gradient is WNL. Skin turgor WNL  Sensorium: Normal Semmes Weinstein monofilament test. Normal tactile sensation bilaterally. Nail Exam: Pt has thick disfigured discolored nails with subungual debris noted bilateral entire nail hallux through fifth toenails Ulcer Exam: There is no evidence of ulcer or pre-ulcerative changes or infection. Orthopedic Exam: Muscle tone and strength are WNL. No limitations in general ROM. No crepitus or effusions noted. Foot type and digits show no abnormalities. HAV  B/L.  Hammer toes 2-5  B/L. 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   Debridement of mycotic and hypertrophic toenails, 1 through 5 bilateral and clearing of subungual debris. No ulceration, no infection noted. ABN signed for 2019 Debride porokeratosis.  .Return Visit-Office Procedure: Patient instructed to return to the office for a follow up visit 3 months for continued evaluation and treatment.    Helane Gunther DPM

## 2018-04-25 ENCOUNTER — Encounter: Payer: Self-pay | Admitting: *Deleted

## 2018-05-15 ENCOUNTER — Encounter: Payer: Self-pay | Admitting: Psychiatry

## 2018-05-15 ENCOUNTER — Ambulatory Visit (INDEPENDENT_AMBULATORY_CARE_PROVIDER_SITE_OTHER): Payer: Medicare Other | Admitting: Psychiatry

## 2018-05-15 DIAGNOSIS — F331 Major depressive disorder, recurrent, moderate: Secondary | ICD-10-CM

## 2018-05-15 MED ORDER — BUSPIRONE HCL 30 MG PO TABS: 30.0000 mg | ORAL_TABLET | Freq: Two times a day (BID) | ORAL | 2 refills | Status: DC

## 2018-05-15 NOTE — Progress Notes (Signed)
[facility]  Patient ID: [name],  MRN: [id number]  PCP: [name], MD  Date of Evaluation: 05/15/2018 Time spent:20 minutes   HISTORY/CURRENT STATUS: HPI patient is a 77 year old black female.  Last seen 02/20/2018.  The time she is doing quite well no change in her plan.  Patient is doing fine she has no anxiety or depression.  Individual Medical History/ Review of Systems: Changes? :No  Allergies: Codeine  Current Medications:  Current Outpatient Medications:  .  aspirin EC 325 MG tablet, Take 325 mg by mouth daily., Disp: , Rfl:  .  atorvastatin (LIPITOR) 20 MG tablet, Take 20 mg by mouth daily. , Disp: , Rfl:  .  busPIRone (BUSPAR) 30 MG tablet, Take 30 mg by mouth 2 (two) times daily., Disp: , Rfl: 0 .  donepezil (ARICEPT) 10 MG tablet, Take 10 mg by mouth at bedtime. , Disp: , Rfl:  .  furosemide (LASIX) 40 MG tablet, Take 1 tablet (40 mg total) by mouth daily as needed for edema., Disp: 30 tablet, Rfl:  .  lisinopril (PRINIVIL,ZESTRIL) 40 MG tablet, Take 40 mg by mouth daily. , Disp: , Rfl:  .  memantine (NAMENDA XR) 28 MG CP24 24 hr capsule, , Disp: , Rfl: 0 .  Multiple Vitamin (MULTIVITAMIN WITH MINERALS) TABS tablet, Take 1 tablet by mouth daily. , Disp: , Rfl:  .  OxyCODONE HCl 15 MG TABA, Take 1 tablet by mouth 4 (four) times daily as needed., Disp: , Rfl:  .  potassium chloride (K-DUR,KLOR-CON) 10 MEQ tablet, Take 10 mEq daily by mouth., Disp: , Rfl:  Medication Side Effects: None  Family Medical/ Social History: Changes? No  MENTAL HEALTH EXAM:  There were no vitals taken for this visit.There is no height or weight on file to calculate BMI.  General Appearance: Casual  Eye Contact:  Good  Speech:  Normal Rate  Volume:  Normal  Mood:  Euthymic  Affect:  Appropriate  Thought Process:  Linear  Orientation:  Full (Time, Place, and Person)  Thought Content: WDL   Suicidal Thoughts:  No  Homicidal Thoughts:  No  Memory:  Immediate   Judgement:  Good  Insight:  Good  Psychomotor Activity:  Normal  Concentration:  Concentration: Good  Recall:  Good  Fund of Knowledge: Good  Language: Good  Akathisia:  NA  AIMS (if indicated): na  Assets:  Desire for Improvement  ADL's:  Impaired needs helping with shower and with shopping  Cognition: WNL  Prognosis:  Good    DIAGNOSES: No diagnosis found.  RECOMMENDATIONS: Patient will continue her current medication regime.  I will see her again in 3 months she is instructed to call if she has any problems.    Anne Fu, PA-C

## 2018-07-17 ENCOUNTER — Ambulatory Visit (INDEPENDENT_AMBULATORY_CARE_PROVIDER_SITE_OTHER): Payer: Medicare Other | Admitting: Podiatry

## 2018-07-17 ENCOUNTER — Encounter: Payer: Self-pay | Admitting: Podiatry

## 2018-07-17 ENCOUNTER — Other Ambulatory Visit: Payer: Self-pay | Admitting: *Deleted

## 2018-07-17 DIAGNOSIS — M79676 Pain in unspecified toe(s): Secondary | ICD-10-CM | POA: Diagnosis not present

## 2018-07-17 DIAGNOSIS — B351 Tinea unguium: Secondary | ICD-10-CM | POA: Diagnosis not present

## 2018-07-17 DIAGNOSIS — I712 Thoracic aortic aneurysm, without rupture, unspecified: Secondary | ICD-10-CM

## 2018-07-17 NOTE — Progress Notes (Signed)
Complaint:  Visit Type: Patient returns to my office for continued preventative foot care services. Complaint: Patient states" my nails have grown long and thick and become painful to walk and wear shoes" Patient has been diagnosed with DM with no foot complications. The patient presents for preventative foot care services. No changes to ROS.  Patient is  Brought to the office by her son.  Podiatric Exam: Vascular: dorsalis pedis and posterior tibial pulses are palpable bilateral. Capillary return is immediate. Temperature gradient is WNL. Skin turgor WNL  Sensorium: Normal Semmes Weinstein monofilament test. Normal tactile sensation bilaterally. Nail Exam: Pt has thick disfigured discolored nails with subungual debris noted bilateral entire nail hallux through fifth toenails Ulcer Exam: There is no evidence of ulcer or pre-ulcerative changes or infection. Orthopedic Exam: Muscle tone and strength are WNL. No limitations in general ROM. No crepitus or effusions noted. Foot type and digits show no abnormalities. HAV  B/L.  Hammer toes 2-5  B/L. Skin:  Porokeratosis sub 4th right foot  Asymptomatic.. No infection or ulcers  Diagnosis:  Onychomycosis, , Pain in right toe, pain in left toes  Treatment & Plan Procedures and Treatment: Consent by patient was obtained for treatment procedures.   Debridement of mycotic and hypertrophic toenails, 1 through 5 bilateral and clearing of subungual debris. No ulceration, no infection noted. ABN signed for 2019 Debride porokeratosis.  .Return Visit-Office Procedure: Patient instructed to return to the office for a follow up visit 3 months for continued evaluation and treatment.    Eddy Liszewski DPM 

## 2018-07-29 ENCOUNTER — Encounter: Payer: Self-pay | Admitting: Emergency Medicine

## 2018-07-29 DIAGNOSIS — F32A Depression, unspecified: Secondary | ICD-10-CM | POA: Insufficient documentation

## 2018-07-29 DIAGNOSIS — F329 Major depressive disorder, single episode, unspecified: Secondary | ICD-10-CM | POA: Insufficient documentation

## 2018-08-17 ENCOUNTER — Ambulatory Visit (INDEPENDENT_AMBULATORY_CARE_PROVIDER_SITE_OTHER): Payer: Medicare Other | Admitting: Psychiatry

## 2018-08-17 DIAGNOSIS — F3289 Other specified depressive episodes: Secondary | ICD-10-CM | POA: Diagnosis not present

## 2018-08-17 DIAGNOSIS — F331 Major depressive disorder, recurrent, moderate: Secondary | ICD-10-CM | POA: Diagnosis not present

## 2018-08-17 MED ORDER — BUSPIRONE HCL 30 MG PO TABS: 30.0000 mg | ORAL_TABLET | Freq: Two times a day (BID) | ORAL | 2 refills | Status: DC

## 2018-08-17 MED ORDER — MIRTAZAPINE 15 MG PO TABS: ORAL_TABLET | ORAL | 2 refills | Status: DC

## 2018-08-17 NOTE — Progress Notes (Signed)
[facility]  Patient ID: [name],  MRN: [id number]  PCP: [name], MD  Date of Evaluation: 08/17/2018 Time spent:20 minutes  Chief Complaint:   HISTORY/CURRENT STATUS: HPI continues to do well. Insomnia. Has cpap but doesn't use.  Individual Medical History/ Review of Systems: Changes? :No   Allergies: Codeine  Current Medications:  Current Outpatient Medications:  .  aspirin EC 325 MG tablet, Take 325 mg by mouth daily., Disp: , Rfl:  .  busPIRone (BUSPAR) 30 MG tablet, Take 1 tablet (30 mg total) by mouth 2 (two) times daily., Disp: 60 tablet, Rfl: 2 .  donepezil (ARICEPT) 10 MG tablet, Take 10 mg by mouth at bedtime. , Disp: , Rfl:  .  ARIPiprazole (ABILIFY) 5 MG tablet, Take 5 mg by mouth daily., Disp: , Rfl:  .  atorvastatin (LIPITOR) 20 MG tablet, Take 20 mg by mouth daily. , Disp: , Rfl:  .  furosemide (LASIX) 40 MG tablet, Take 1 tablet (40 mg total) by mouth daily as needed for edema., Disp: 30 tablet, Rfl:  .  lisinopril (PRINIVIL,ZESTRIL) 40 MG tablet, Take 40 mg by mouth daily. , Disp: , Rfl:  .  memantine (NAMENDA XR) 28 MG CP24 24 hr capsule, , Disp: , Rfl: 0 .  mirtazapine (REMERON) 15 MG tablet, Take 1/2 tab  30 minutes before bed, Disp: 15 tablet, Rfl: 2 .  Multiple Vitamin (MULTIVITAMIN WITH MINERALS) TABS tablet, Take 1 tablet by mouth daily. , Disp: , Rfl:  .  OxyCODONE HCl 15 MG TABA, Take 1 tablet by mouth 4 (four) times daily as needed., Disp: , Rfl:  .  potassium chloride (K-DUR,KLOR-CON) 10 MEQ tablet, Take 10 mEq daily by mouth., Disp: , Rfl:  Medication Side Effects: none  Family Medical/ Social History: Changes? No  MENTAL HEALTH EXAM:  There were no vitals taken for this visit.There is no height or weight on file to calculate BMI.  General Appearance: Casual walks with cane. overweight  Eye Contact:  Good  Speech:  Normal Rate  Volume:  Normal  Mood:  Euthymic  Affect:  Appropriate  Thought Process:  Linear   Orientation:  Full (Time, Place, and Person)  Thought Content: Logical   Suicidal Thoughts:  No  Homicidal Thoughts:  No  Memory:  ok  Judgement:  Good  Insight:  Good  Psychomotor Activity:  Normal  Concentration:  Concentration: Good  Recall:  Good  Fund of Knowledge: Good  Language: Good  Assets:  Desire for Improvement  ADL's:  Intact  Cognition: WNL  Prognosis:  Good    DIAGNOSES:    ICD-10-CM   1. Other depression F32.89   2. Major depressive disorder, recurrent episode, moderate (HCC) F33.1 busPIRone (BUSPAR) 30 MG tablet    Receiving Psychotherapy: No    RECOMMENDATIONS: continue buspar 15mg  bid Start remeron 15 mg. 1/2 tab 30 minutes before bed Try to use cpap Pt on oxycodone so Im hesitant to write bzd. 3 months   Anne Fu, PA-C

## 2018-08-22 ENCOUNTER — Other Ambulatory Visit: Payer: Self-pay | Admitting: Psychiatry

## 2018-08-22 DIAGNOSIS — F331 Major depressive disorder, recurrent, moderate: Secondary | ICD-10-CM

## 2018-08-26 ENCOUNTER — Ambulatory Visit (INDEPENDENT_AMBULATORY_CARE_PROVIDER_SITE_OTHER): Payer: Medicare Other | Admitting: Surgery

## 2018-08-26 ENCOUNTER — Other Ambulatory Visit: Payer: Self-pay

## 2018-08-26 ENCOUNTER — Ambulatory Visit
Admission: RE | Admit: 2018-08-26 | Discharge: 2018-08-26 | Disposition: A | Discharge status: Home / Self Care | Payer: Medicare Other | Source: Ambulatory Visit | Attending: Surgery | Admitting: Surgery

## 2018-08-26 ENCOUNTER — Encounter: Payer: Self-pay | Admitting: Surgery

## 2018-08-26 VITALS — BP 164/84 | HR 54 | Resp 16 | Ht 62.0 in | Wt 190.0 lb

## 2018-08-26 DIAGNOSIS — I712 Thoracic aortic aneurysm, without rupture, unspecified: Secondary | ICD-10-CM

## 2018-08-26 DIAGNOSIS — I7121 Aneurysm of the ascending aorta, without rupture: Secondary | ICD-10-CM

## 2018-08-26 NOTE — Progress Notes (Signed)
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 She is a 78 year old woman with a history of hypertension, hyperlipidemia, OSA on CPAP, asthma, scoliosis and chronic back pain with reduced mobility, and a left lower extremity DVT with bilateral pulmonary emboli and right heart strain in February 2018.  CT scan of the chest at that time showed a 4.5 cm ascending aortic aneurysm.  A repeat CT scan 1 year ago showed the aneurysm to be 4.2 cm.  She is here today with her son.  Current Outpatient Medications  Medication Sig Dispense Refill  . ARIPiprazole (ABILIFY) 5 MG tablet Take 5 mg by mouth daily.    Marland Kitchen aspirin EC 325 MG tablet Take 325 mg by mouth daily.    Marland Kitchen atorvastatin (LIPITOR) 20 MG tablet Take 20 mg by mouth daily.     . busPIRone (BUSPAR) 30 MG tablet Take 1 tablet (30 mg total) by mouth 2 (two) times daily. 60 tablet 2  . donepezil (ARICEPT) 10 MG tablet Take 10 mg by mouth at bedtime.     . furosemide (LASIX) 40 MG tablet Take 1 tablet (40 mg total) by mouth daily as needed for edema. 30 tablet   . lisinopril (PRINIVIL,ZESTRIL) 40 MG tablet Take 40 mg by mouth daily.     . memantine (NAMENDA XR) 28 MG CP24 24 hr capsule   0  . mirtazapine (REMERON) 15 MG tablet Take 1/2 tab  30 minutes before bed 15 tablet 2  . Multiple Vitamin (MULTIVITAMIN WITH MINERALS) TABS tablet Take 1 tablet by mouth daily.     . OxyCODONE HCl 15 MG TABA Take 1 tablet by mouth 4 (four) times daily as needed.    . potassium chloride (K-DUR,KLOR-CON) 10 MEQ tablet Take 10 mEq daily by mouth.     No current facility-administered medications for this visit.      Physical Exam: BP (!) 164/84 (BP Location: Left Arm, Patient Position: Sitting, Cuff Size: Large)   Pulse (!) 54   Resp 16   Ht 5\' 2"  (1.575 m)   Wt 190 lb (86.2 kg)   SpO2 95% Comment: RA  BMI 34.75 kg/m  She is an elderly woman in no distress who is walking slowly with a cane. Cardiac exam  shows a regular rate and rhythm with normal heart sounds.  There is no murmur. Lungs are clear.  Diagnostic Tests:  CLINICAL DATA:  Follow-up thoracic aortic aneurysm  EXAM: CT CHEST WITHOUT CONTRAST  TECHNIQUE: Multidetector CT imaging of the chest was performed following the standard protocol without IV contrast.  COMPARISON:  08/20/2017  FINDINGS: Cardiovascular: Examination is limited secondary to lack of IV contrast. Diffuse aortic atherosclerotic changes are seen. Ascending aorta is again dilated to 4.2 cm stable in appearance from the prior study. No pericardial effusion is seen. Mild coronary calcifications are noted.  Mediastinum/Nodes: Thoracic inlet is within normal limits. No sizable mediastinal or hilar adenopathy is seen. The esophagus is within normal limits.  Lungs/Pleura: The lungs are well aerated bilaterally. A few scattered small calcified and noncalcified nodules are seen stable from the previous exam consistent with prior granulomatous disease. No focal confluent infiltrate or sizable effusion is noted.  Upper Abdomen: Visualized upper abdomen shows no focal abnormality.  Musculoskeletal: Degenerative changes of the thoracic spine are noted. Postsurgical changes in the upper lumbar spine are noted. Stable T7 compression deformity is noted. No acute bony abnormality is seen.  IMPRESSION: Somewhat limited exam due to lack of IV contrast. Stable aneurysmal dilatation of the ascending aorta to 4.2 cm is noted.  Stable scattered calcified and noncalcified nodules. This is consistent with prior granulomatous disease.   Electronically Signed   By: Alcide Clever M.D.   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  She is 78 years old with multiple medical problems and severe spine disease that limits her mobility.   I do not think she would be a candidate for any surgical treatment.  This aneurysm is well below the 5.5 cm surgical threshold and I think it extremely unlikely that it will increase in size since the wall is diffusely calcified.  I do not think she requires further CT scan of the chest to follow-up on this given its relatively small size at her age.  I reviewed the CT images with the patient and her son and answered their questions.  I discussed the importance of good blood pressure control and preventing aortic dissection and enlargement of aortic aneurysms.  Plan:  She will continue to follow-up with her primary physician but does not require further follow-up CT scans for this small ascending aortic aneurysm.  I spent 15 minutes performing this established patient evaluation and > 50% of this time was spent face to face counseling and coordinating the care of this patient's aortic aneurysm.    Alleen Borne, MD [facility] [phone]

## 2018-09-25 ENCOUNTER — Other Ambulatory Visit: Payer: Self-pay | Admitting: Psychiatry

## 2018-10-16 ENCOUNTER — Encounter: Payer: Self-pay | Admitting: Podiatry

## 2018-10-16 ENCOUNTER — Ambulatory Visit (INDEPENDENT_AMBULATORY_CARE_PROVIDER_SITE_OTHER): Payer: Medicare Other | Admitting: Podiatry

## 2018-10-16 DIAGNOSIS — B351 Tinea unguium: Secondary | ICD-10-CM | POA: Diagnosis not present

## 2018-10-16 DIAGNOSIS — M2042 Other hammer toe(s) (acquired), left foot: Secondary | ICD-10-CM

## 2018-10-16 DIAGNOSIS — M79676 Pain in unspecified toe(s): Secondary | ICD-10-CM

## 2018-10-16 DIAGNOSIS — M2041 Other hammer toe(s) (acquired), right foot: Secondary | ICD-10-CM

## 2018-10-16 NOTE — Progress Notes (Signed)
Complaint:  Visit Type: Patient returns to my office for continued preventative foot care services. Complaint: Patient states" my nails have grown long and thick and become painful to walk and wear shoes" Patient has been diagnosed with DM with no foot complications. The patient presents for preventative foot care services. No changes to ROS.  Patient is  brought to the office by her son.  Podiatric Exam: Vascular: dorsalis pedis and posterior tibial pulses are palpable bilateral. Capillary return is immediate. Temperature gradient is WNL. Skin turgor WNL  Sensorium: Normal Semmes Weinstein monofilament test. Normal tactile sensation bilaterally. Nail Exam: Pt has thick disfigured discolored nails with subungual debris noted bilateral entire nail hallux through fifth toenails Ulcer Exam: There is no evidence of ulcer or pre-ulcerative changes or infection. Orthopedic Exam: Muscle tone and strength are WNL. No limitations in general ROM. No crepitus or effusions noted. Foot type and digits show no abnormalities. HAV  B/L.  Hammer toes 2-5  B/L. Skin:  Porokeratosis sub 4th right foot  Asymptomatic.. No infection or ulcers  Diagnosis:  Onychomycosis, , Pain in right toe, pain in left toes  Treatment & Plan Procedures and Treatment: Consent by patient was obtained for treatment procedures.   Debridement of mycotic and hypertrophic toenails, 1 through 5 bilateral and clearing of subungual debris. No ulceration, no infection noted. ABN signed for 2019 Debride porokeratosis.  .Return Visit-Office Procedure: Patient instructed to return to the office for a follow up visit 3 months for continued evaluation and treatment.    Caroll Weinheimer DPM 

## 2018-11-16 ENCOUNTER — Ambulatory Visit: Payer: Medicare Other | Admitting: Psychiatry

## 2018-11-27 ENCOUNTER — Ambulatory Visit (INDEPENDENT_AMBULATORY_CARE_PROVIDER_SITE_OTHER): Payer: Medicare Other | Admitting: Orthopedic Surgery

## 2018-11-27 ENCOUNTER — Other Ambulatory Visit: Payer: Self-pay

## 2018-11-27 ENCOUNTER — Encounter (INDEPENDENT_AMBULATORY_CARE_PROVIDER_SITE_OTHER): Payer: Self-pay | Admitting: Orthopedic Surgery

## 2018-11-27 ENCOUNTER — Ambulatory Visit (INDEPENDENT_AMBULATORY_CARE_PROVIDER_SITE_OTHER): Payer: Medicare Other

## 2018-11-27 DIAGNOSIS — M25512 Pain in left shoulder: Secondary | ICD-10-CM | POA: Diagnosis not present

## 2018-11-27 DIAGNOSIS — M19012 Primary osteoarthritis, left shoulder: Secondary | ICD-10-CM | POA: Diagnosis not present

## 2018-11-27 MED ORDER — LIDOCAINE HCL 1 % IJ SOLN
5.0000 mL | INTRAMUSCULAR | Status: AC | PRN
  Administered 2018-11-27: 5 mL

## 2018-11-27 MED ORDER — BUPIVACAINE HCL 0.5 % IJ SOLN
9.0000 mL | INTRAMUSCULAR | Status: AC | PRN
  Administered 2018-11-27: 9 mL via INTRA_ARTICULAR

## 2018-11-27 MED ORDER — METHYLPREDNISOLONE ACETATE 40 MG/ML IJ SUSP
40.0000 mg | INTRAMUSCULAR | Status: AC | PRN
  Administered 2018-11-27: 40 mg via INTRA_ARTICULAR
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
## 2018-12-14 ENCOUNTER — Other Ambulatory Visit: Payer: Self-pay

## 2018-12-14 ENCOUNTER — Ambulatory Visit: Payer: Medicare Other | Admitting: Psychiatry

## 2018-12-14 ENCOUNTER — Telehealth: Payer: Self-pay | Admitting: Psychiatry

## 2018-12-14 DIAGNOSIS — F331 Major depressive disorder, recurrent, moderate: Secondary | ICD-10-CM

## 2018-12-14 MED ORDER — BUSPIRONE HCL 30 MG PO TABS: 30.0000 mg | ORAL_TABLET | Freq: Two times a day (BID) | ORAL | 0 refills | Status: DC

## 2018-12-14 MED ORDER — MIRTAZAPINE 15 MG PO TABS: ORAL_TABLET | ORAL | 0 refills | Status: DC

## 2018-12-14 NOTE — Telephone Encounter (Signed)
Rolanda called for Pt requesting refill on Remeron and Buspar @ [facility]  [address] Appt on 5/19 with T.Hurst

## 2018-12-14 NOTE — Telephone Encounter (Signed)
Refills submitted.  

## 2018-12-29 ENCOUNTER — Other Ambulatory Visit: Payer: Self-pay

## 2018-12-29 ENCOUNTER — Encounter: Payer: Self-pay | Admitting: Physician Assistant

## 2018-12-29 ENCOUNTER — Ambulatory Visit (INDEPENDENT_AMBULATORY_CARE_PROVIDER_SITE_OTHER): Payer: Medicare Other | Admitting: Physician Assistant

## 2018-12-29 DIAGNOSIS — G4700 Insomnia, unspecified: Secondary | ICD-10-CM | POA: Diagnosis not present

## 2018-12-29 DIAGNOSIS — F411 Generalized anxiety disorder: Secondary | ICD-10-CM

## 2018-12-29 DIAGNOSIS — F331 Major depressive disorder, recurrent, moderate: Secondary | ICD-10-CM | POA: Diagnosis not present

## 2018-12-29 MED ORDER — MIRTAZAPINE 7.5 MG PO TABS: 7.5000 mg | ORAL_TABLET | Freq: Every day | ORAL | 0 refills | Status: DC

## 2018-12-29 MED ORDER — BUSPIRONE HCL 30 MG PO TABS: 30.0000 mg | ORAL_TABLET | Freq: Two times a day (BID) | ORAL | 2 refills | Status: DC

## 2018-12-29 NOTE — Progress Notes (Signed)
[facility]  Patient ID: [name],  MRN: [id number]  PCP: [name], MD  Date of Evaluation: 12/29/2018 Time spent:15 minutes  Chief Complaint:  Chief Complaint    Follow-up     Virtual Visit via Telephone Note  I connected with patient by a video enabled telemedicine application or telephone, with their informed consent, and verified patient privacy and that I am speaking with the correct person using two identifiers.  I am private, in my home and the patient is home.  I discussed the limitations, risks, security and privacy concerns of performing an evaluation and management service by telephone and the availability of in person appointments. I also discussed with the patient that there may be a patient responsible charge related to this service. The patient expressed understanding and agreed to proceed.   I discussed the assessment and treatment plan with the patient. The patient was provided an opportunity to ask questions and all were answered. The patient agreed with the plan and demonstrated an understanding of the instructions.   The patient was advised to call back or seek an in-person evaluation if the symptoms worsen or if the condition fails to improve as anticipated.  I provided 15 minutes of non-face-to-face time during this encounter.  HISTORY/CURRENT STATUS: HPI For routine med check.  Patient transferred to my care after her provider, my colleague, Anne Fu, PA recently passed away.  "I'm doing fine!  I need my checkup."  Patient's son, Harvie Heck, is also on the phone with [place].  Patient states she is doing really well.  Her medications are working fine.  She does have trouble sleeping once a week or so.  She takes the mirtazapine 30 mg pill, half of that but it causes her to sleep too much the next day.  Reports she does not like to take it very often because of that.  Most of the time she does not need it.    States her anxiety is controlled.   She is tired of being stuck in the house due to the coronavirus pandemic.  States she likes to go out and Harvie Heck takes her for a drive sometimes but she is not able to go anywhere otherwise.  Memory is pretty good.  States she writes things down and also tells her son things that are important so she does not forget.  Able to enjoy things.  Energy and motivation are good.  He is not isolating except due to the coronavirus.  Harvie Heck takes care of her and takes her to all of her appointments and make sure she has what she needs.  She does not cry easily.  Denies dizziness, syncope, seizures, numbness, tingling, tremor, tics, unsteady gait, slurred speech, confusion. Denies muscle or joint pain, stiffness, or dystonia.  Individual Medical History/ Review of Systems: Changes? :No    Past medications for mental health diagnoses include: unknown  Allergies: Codeine  Current Medications:  Current Outpatient Medications:  .  aspirin EC 325 MG tablet, Take 325 mg by mouth daily., Disp: , Rfl:  .  atorvastatin (LIPITOR) 20 MG tablet, Take 20 mg by mouth daily. , Disp: , Rfl:  .  busPIRone (BUSPAR) 30 MG tablet, Take 1 tablet (30 mg total) by mouth 2 (two) times daily., Disp: 60 tablet, Rfl: 2 .  donepezil (ARICEPT) 10 MG tablet, Take 10 mg by mouth at bedtime. , Disp: , Rfl:  .  furosemide (LASIX) 40 MG tablet, Take 1 tablet (40 mg total) by  mouth daily as needed for edema., Disp: 30 tablet, Rfl:  .  hydrochlorothiazide (HYDRODIURIL) 25 MG tablet, , Disp: , Rfl:  .  lisinopril (PRINIVIL,ZESTRIL) 40 MG tablet, Take 40 mg by mouth daily. , Disp: , Rfl:  .  memantine (NAMENDA XR) 28 MG CP24 24 hr capsule, , Disp: , Rfl: 0 .  Multiple Vitamin (MULTIVITAMIN WITH MINERALS) TABS tablet, Take 1 tablet by mouth daily. , Disp: , Rfl:  .  oxyCODONE (ROXICODONE) 15 MG immediate release tablet, , Disp: , Rfl:  .  RESTASIS 0.05 % ophthalmic emulsion, , Disp: , Rfl:  .  mirtazapine (REMERON) 7.5 MG tablet, Take 1  tablet (7.5 mg total) by mouth at bedtime., Disp: 30 tablet, Rfl: 0 Medication Side Effects: none  Family Medical/ Social History: Changes? Yes having to stay in more due to the coronavirus pandemic.  MENTAL HEALTH EXAM:  There were no vitals taken for this visit.There is no height or weight on file to calculate BMI.  General Appearance: Unable to assess  Eye Contact:  Unable to assess  Speech:  Clear and Coherent  Volume:  Normal  Mood:  Euthymic  Affect:  Unable to assess  Thought Process:  Goal Directed  Orientation:  Full (Time, Place, and Person)  Thought Content: Logical   Suicidal Thoughts:  No  Homicidal Thoughts:  No  Memory:  WNL  Judgement:  Good  Insight:  Good  Psychomotor Activity:  Unable to assess  Concentration:  Concentration: Good  Recall:  Good  Fund of Knowledge: Good  Language: Good  Assets:  Desire for Improvement  ADL's:  Intact  Cognition: WNL  Prognosis:  Good    DIAGNOSES:    ICD-10-CM   1. Generalized anxiety disorder F41.1   2. Major depressive disorder, recurrent episode, moderate (HCC) F33.1 busPIRone (BUSPAR) 30 MG tablet  3. Insomnia, unspecified type G47.00     Receiving Psychotherapy: No    RECOMMENDATIONS:  She has mirtazapine 30 mg pills and breaks those in half.  Recommended decrease mirtazapine 30 mg to one fourth of a pill as needed.  This would be 7.5 mg.  She and her son understand.  Hopefully this will not cause so much drowsy in the next day but will help the problem of insomnia. Continue BuSpar 30 mg twice daily. Continue Aricept 10 mg nightly.  Given by another provider. Continue Namenda XR 28 mg daily.  Given by another provider. Return in 3 months or sooner as needed.   Melony Overly, PA-C   This record has been created using [employer].  Chart creation errors have been sought, but may not always have been located and corrected. Such creation errors do not reflect on the standard of medical care.

## 2019-01-12 ENCOUNTER — Other Ambulatory Visit: Payer: Self-pay | Admitting: Physician Assistant

## 2019-01-15 ENCOUNTER — Other Ambulatory Visit: Payer: Self-pay

## 2019-01-15 ENCOUNTER — Ambulatory Visit (INDEPENDENT_AMBULATORY_CARE_PROVIDER_SITE_OTHER): Payer: Medicare Other | Admitting: Podiatry

## 2019-01-15 ENCOUNTER — Encounter: Payer: Self-pay | Admitting: Podiatry

## 2019-01-15 VITALS — Temp 97.2°F

## 2019-01-15 DIAGNOSIS — G9340 Encephalopathy, unspecified: Secondary | ICD-10-CM

## 2019-01-15 DIAGNOSIS — B351 Tinea unguium: Secondary | ICD-10-CM | POA: Diagnosis not present

## 2019-01-15 DIAGNOSIS — M79676 Pain in unspecified toe(s): Secondary | ICD-10-CM | POA: Diagnosis not present

## 2019-01-15 DIAGNOSIS — M2041 Other hammer toe(s) (acquired), right foot: Secondary | ICD-10-CM | POA: Diagnosis not present

## 2019-01-15 DIAGNOSIS — M2042 Other hammer toe(s) (acquired), left foot: Secondary | ICD-10-CM

## 2019-01-15 NOTE — Progress Notes (Signed)
Complaint:  Visit Type: Patient returns to my office for continued preventative foot care services. Complaint: Patient states" my nails have grown long and thick and become painful to walk and wear shoes" Patient has been diagnosed with DM with no foot complications. The patient presents for preventative foot care services. No changes to ROS.  Patient is  brought to the office by her son.  Podiatric Exam: Vascular: dorsalis pedis and posterior tibial pulses are palpable bilateral. Capillary return is immediate. Temperature gradient is WNL. Skin turgor WNL  Sensorium: Normal Semmes Weinstein monofilament test. Normal tactile sensation bilaterally. Nail Exam: Pt has thick disfigured discolored nails with subungual debris noted bilateral entire nail hallux through fifth toenails Ulcer Exam: There is no evidence of ulcer or pre-ulcerative changes or infection. Orthopedic Exam: Muscle tone and strength are WNL. No limitations in general ROM. No crepitus or effusions noted. Foot type and digits show no abnormalities. HAV  B/L.  Hammer toes 2-5  B/L. Skin:  Porokeratosis sub 4th right foot  Asymptomatic.. No infection or ulcers  Diagnosis:  Onychomycosis, , Pain in right toe, pain in left toes  Treatment & Plan Procedures and Treatment: Consent by patient was obtained for treatment procedures.   Debridement of mycotic and hypertrophic toenails, 1 through 5 bilateral and clearing of subungual debris. No ulceration, no infection noted. ABN signed for 2019 Debride porokeratosis.  .Return Visit-Office Procedure: Patient instructed to return to the office for a follow up visit 3 months for continued evaluation and treatment.    Tilley Faeth DPM 

## 2019-02-23 ENCOUNTER — Encounter: Payer: Self-pay | Admitting: Physician Assistant

## 2019-02-23 ENCOUNTER — Other Ambulatory Visit: Payer: Self-pay

## 2019-02-23 ENCOUNTER — Ambulatory Visit (INDEPENDENT_AMBULATORY_CARE_PROVIDER_SITE_OTHER): Payer: Medicare Other | Admitting: Physician Assistant

## 2019-02-23 DIAGNOSIS — F411 Generalized anxiety disorder: Secondary | ICD-10-CM

## 2019-02-23 DIAGNOSIS — F331 Major depressive disorder, recurrent, moderate: Secondary | ICD-10-CM

## 2019-02-23 DIAGNOSIS — G4700 Insomnia, unspecified: Secondary | ICD-10-CM

## 2019-02-23 NOTE — Progress Notes (Signed)
[facility]  Patient ID: [name],  MRN: [id number]  PCP: [name], MD  Date of Evaluation: 02/23/2019 Time spent:15 minutes  Chief Complaint:  Chief Complaint    Insomnia     Virtual Visit via Telephone Note  I connected with patient by a video enabled telemedicine application or telephone, with their informed consent, and verified patient privacy and that I am speaking with the correct person using two identifiers.  I am private, in my home and the patient is home.   I discussed the limitations, risks, security and privacy concerns of performing an evaluation and management service by telephone and the availability of in person appointments. I also discussed with the patient that there may be a patient responsible charge related to this service. The patient expressed understanding and agreed to proceed.   I discussed the assessment and treatment plan with the patient. The patient was provided an opportunity to ask questions and all were answered. The patient agreed with the plan and demonstrated an understanding of the instructions.   The patient was advised to call back or seek an in-person evaluation if the symptoms worsen or if the condition fails to improve as anticipated.  I provided 15 minutes of non-face-to-face time during this encounter.  HISTORY/CURRENT STATUS: HPI For routine med check.  Her son Harvie Heck is also on the phone.  Patient states she is doing very well and has no complaints.  Harvie Heck says she has good days and bad days but mostly good.  Her mood is good.  She is not crying easily.  She is able to enjoy things.  States her age limits her with motivation and energy but doing well for her age.  She denies suicidal or homicidal thoughts.  Her medicines seem to be working well.  She is not having to take the mirtazapine very often for sleep.  It does help.  She feels rested when she gets up.  Denies any worsening of anxiety and feels that the  BuSpar is working well.  Harvie Heck agrees with these assessments.  They both state her memory is about the same.  Individual Medical History/ Review of Systems: Changes? :No    Past medications for mental health diagnoses include: unknown  Allergies: Codeine  Current Medications:  Current Outpatient Medications:  .  aspirin EC 325 MG tablet, Take 325 mg by mouth daily., Disp: , Rfl:  .  atorvastatin (LIPITOR) 20 MG tablet, Take 20 mg by mouth daily. , Disp: , Rfl:  .  busPIRone (BUSPAR) 30 MG tablet, Take 1 tablet (30 mg total) by mouth 2 (two) times daily., Disp: 60 tablet, Rfl: 2 .  donepezil (ARICEPT) 10 MG tablet, Take 10 mg by mouth at bedtime. , Disp: , Rfl:  .  furosemide (LASIX) 40 MG tablet, Take 1 tablet (40 mg total) by mouth daily as needed for edema., Disp: 30 tablet, Rfl:  .  hydrochlorothiazide (HYDRODIURIL) 25 MG tablet, , Disp: , Rfl:  .  lisinopril (PRINIVIL,ZESTRIL) 40 MG tablet, Take 40 mg by mouth daily. , Disp: , Rfl:  .  memantine (NAMENDA XR) 28 MG CP24 24 hr capsule, , Disp: , Rfl: 0 .  mirtazapine (REMERON) 7.5 MG tablet, Take 1 tablet (7.5 mg total) by mouth at bedtime., Disp: 30 tablet, Rfl: 0 .  Multiple Vitamin (MULTIVITAMIN WITH MINERALS) TABS tablet, Take 1 tablet by mouth daily. , Disp: , Rfl:  .  oxyCODONE (ROXICODONE) 15 MG immediate release tablet, , Disp: , Rfl:  .  RESTASIS 0.05 % ophthalmic emulsion, , Disp: , Rfl:  Medication Side Effects: none  Family Medical/ Social History: Changes? No  MENTAL HEALTH EXAM:  There were no vitals taken for this visit.There is no height or weight on file to calculate BMI.  General Appearance: Unable to assess  Eye Contact:  Good  Speech:  Clear and Coherent  Volume:  Normal  Mood:  Euthymic  Affect:  Unable to assess  Thought Process:  Goal Directed  Orientation:  Full (Time, Place, and Person)  Thought Content: Logical   Suicidal Thoughts:  No  Homicidal Thoughts:  No  Memory:  Recent;   Fair chronic  changes of dementia  Judgement:  Other:  Unknown  Insight:  Unknown  Psychomotor Activity:  Unable to assess  Concentration:  Concentration: Good  Recall:  Good  Fund of Knowledge: Good  Language: Good  Assets:  Desire for Improvement  ADL's:  Intact  Cognition: WNL  Prognosis:  Good    DIAGNOSES:    ICD-10-CM   1. Generalized anxiety disorder  F41.1   2. Major depressive disorder, recurrent episode, moderate (HCC)  F33.1   3. Insomnia, unspecified type  G47.00     Receiving Psychotherapy: No    RECOMMENDATIONS:  Continue BuSpar 30 mg 1 p.o. twice daily. Continue mirtazapine total of 7.5 mg nightly as needed. Continue Namenda X are 28 mg daily. Continue Aricept 10 mg nightly. Return in 3 months.  Donnal Moat, PA-C   This record has been created using [employer].  Chart creation errors have been sought, but may not always have been located and corrected. Such creation errors do not reflect on the standard of medical care.

## 2019-03-22 ENCOUNTER — Other Ambulatory Visit: Payer: Self-pay

## 2019-03-22 DIAGNOSIS — Z20822 Contact with and (suspected) exposure to covid-19: Secondary | ICD-10-CM

## 2019-03-24 LAB — NOVEL CORONAVIRUS, NAA: SARS-CoV-2, NAA: NOT DETECTED

## 2019-04-23 ENCOUNTER — Encounter: Payer: Self-pay | Admitting: Podiatry

## 2019-04-23 ENCOUNTER — Other Ambulatory Visit: Payer: Self-pay

## 2019-04-23 ENCOUNTER — Ambulatory Visit (INDEPENDENT_AMBULATORY_CARE_PROVIDER_SITE_OTHER): Payer: Medicare Other | Admitting: Podiatry

## 2019-04-23 DIAGNOSIS — M79676 Pain in unspecified toe(s): Secondary | ICD-10-CM | POA: Diagnosis not present

## 2019-04-23 DIAGNOSIS — B351 Tinea unguium: Secondary | ICD-10-CM | POA: Diagnosis not present

## 2019-04-23 DIAGNOSIS — M2041 Other hammer toe(s) (acquired), right foot: Secondary | ICD-10-CM | POA: Diagnosis not present

## 2019-04-23 DIAGNOSIS — M2042 Other hammer toe(s) (acquired), left foot: Secondary | ICD-10-CM

## 2019-04-23 NOTE — Progress Notes (Signed)
Complaint:  Visit Type: Patient returns to my office for continued preventative foot care services. Complaint: Patient states" my nails have grown long and thick and become painful to walk and wear shoes" Patient has been diagnosed with DM with no foot complications. The patient presents for preventative foot care services. No changes to ROS.  Patient is  brought to the office by her son.  Podiatric Exam: Vascular: dorsalis pedis and posterior tibial pulses are palpable bilateral. Capillary return is immediate. Temperature gradient is WNL. Skin turgor WNL  Sensorium: Normal Semmes Weinstein monofilament test. Normal tactile sensation bilaterally. Nail Exam: Pt has thick disfigured discolored nails with subungual debris noted bilateral entire nail hallux through fifth toenails Ulcer Exam: There is no evidence of ulcer or pre-ulcerative changes or infection. Orthopedic Exam: Muscle tone and strength are WNL. No limitations in general ROM. No crepitus or effusions noted. Foot type and digits show no abnormalities. HAV  B/L.  Hammer toes 2-5  B/L. 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   Debridement of mycotic and hypertrophic toenails, 1 through 5 bilateral and clearing of subungual debris. No ulceration, no infection noted. ABN signed for 2019 [place] porokeratosis.  .Return Visit-Office Procedure: Patient instructed to return to the office for a follow up visit 3 months for continued evaluation and treatment.    Gardiner Barefoot DPM

## 2019-07-06 ENCOUNTER — Encounter: Payer: Self-pay | Admitting: Physician Assistant

## 2019-07-06 ENCOUNTER — Ambulatory Visit (INDEPENDENT_AMBULATORY_CARE_PROVIDER_SITE_OTHER): Payer: Medicare Other | Admitting: Physician Assistant

## 2019-07-06 ENCOUNTER — Other Ambulatory Visit: Payer: Self-pay

## 2019-07-06 DIAGNOSIS — G4700 Insomnia, unspecified: Secondary | ICD-10-CM | POA: Diagnosis not present

## 2019-07-06 DIAGNOSIS — F411 Generalized anxiety disorder: Secondary | ICD-10-CM

## 2019-07-06 DIAGNOSIS — F331 Major depressive disorder, recurrent, moderate: Secondary | ICD-10-CM | POA: Diagnosis not present

## 2019-07-06 MED ORDER — MIRTAZAPINE 30 MG PO TABS: 7.5000 mg | ORAL_TABLET | Freq: Every day | ORAL | 2 refills | Status: DC

## 2019-07-06 MED ORDER — BUSPIRONE HCL 30 MG PO TABS: 30.0000 mg | ORAL_TABLET | Freq: Two times a day (BID) | ORAL | 2 refills | Status: DC

## 2019-07-06 NOTE — Progress Notes (Signed)
[facility]  Patient ID: [name],  MRN: [id number]  PCP: Sarah Ebbs, MD  Date of Evaluation: 07/06/2019 Time spent:15 minutes  Chief Complaint:  Chief Complaint    Depression; Anxiety; Follow-up      HISTORY/CURRENT STATUS: HPI For routine med check.  Accompanied by [name].  Doing really well. Feels good on her meds.  States she sleeps well.  [name] is cutting the mirtazapine 30 mg into 1/4 pills and giving her that as needed.  It is cheaper for her to do it that way evidently and he does not mind splitting the pills.  It is working well.    Patient denies loss of interest in usual activities and is able to enjoy things.  Denies decreased energy or motivation.  Appetite has not changed.  No extreme sadness, tearfulness, or feelings of hopelessness.  Denies any changes in concentration, making decisions or remembering things.  Denies suicidal or homicidal thoughts.  States the BuSpar is working well.  Her memory is about the same.  She sees [name], neuro for the Aricept and Namenda scripts.  Denies dizziness, syncope, seizures, numbness, tingling, tremor, tics, unsteady gait, slurred speech, confusion. Denies muscle or joint pain, stiffness, or dystonia.  Individual Medical History/ Review of Systems: Changes? :No    Past medications for mental health diagnoses include: unknown  Allergies: Codeine  Current Medications:  Current Outpatient Medications:  .  aspirin EC 325 MG tablet, Take 325 mg by mouth daily., Disp: , Rfl:  .  atorvastatin (LIPITOR) 20 MG tablet, Take 20 mg by mouth daily. , Disp: , Rfl:  .  busPIRone (BUSPAR) 30 MG tablet, Take 1 tablet (30 mg total) by mouth 2 (two) times daily., Disp: 60 tablet, Rfl: 2 .  cholecalciferol (VITAMIN D3) 25 MCG (1000 UT) tablet, Take 1,000 Units by mouth daily., Disp: , Rfl:  .  donepezil (ARICEPT) 10 MG tablet, Take 10 mg by mouth at bedtime. , Disp: , Rfl:  .  furosemide (LASIX) 40 MG  tablet, Take 1 tablet (40 mg total) by mouth daily as needed for edema., Disp: 30 tablet, Rfl:  .  lisinopril (PRINIVIL,ZESTRIL) 40 MG tablet, Take 40 mg by mouth daily. , Disp: , Rfl:  .  memantine (NAMENDA XR) 28 MG CP24 24 hr capsule, , Disp: , Rfl: 0 .  Multiple Vitamin (MULTIVITAMIN WITH MINERALS) TABS tablet, Take 1 tablet by mouth daily. , Disp: , Rfl:  .  oxyCODONE (ROXICODONE) 15 MG immediate release tablet, , Disp: , Rfl:  .  potassium chloride (K-DUR) 10 MEQ tablet, , Disp: , Rfl:  .  RESTASIS 0.05 % ophthalmic emulsion, , Disp: , Rfl:  .  Zinc 25 MG TABS, Take by mouth., Disp: , Rfl:  .  hydrochlorothiazide (HYDRODIURIL) 25 MG tablet, , Disp: , Rfl:  .  mirtazapine (REMERON) 30 MG tablet, Take 0.5-1 tablets (15-30 mg total) by mouth at bedtime., Disp: 30 tablet, Rfl: 2 Medication Side Effects: none  Family Medical/ Social History: Changes? No  MENTAL HEALTH EXAM:  There were no vitals taken for this visit.There is no height or weight on file to calculate BMI.  General Appearance: Casual, Neat, Well Groomed and Obese  Eye Contact:  Good  Speech:  Clear and Coherent  Volume:  Normal  Mood:  Euthymic  Affect:  Appropriate  Thought Process:  Goal Directed and Descriptions of Associations: Intact  Orientation:  Full (Time, Place, and Person)  Thought Content: Logical   Suicidal  Thoughts:  No  Homicidal Thoughts:  No  Memory:  Immediate;   Fair Recent;   Fair Remote;   Fair  Judgement:  Good  Insight:  Good  Psychomotor Activity:  She is slow and walks with a cane and assistance of her son.  Concentration:  Concentration: Good  Recall:  Good  Fund of Knowledge: Good  Language: Good  Assets:  Desire for Improvement  ADL's:  Intact  Cognition: WNL  Prognosis:  Good    DIAGNOSES:    ICD-10-CM   1. Generalized anxiety disorder  F41.1   2. Major depressive disorder, recurrent episode, moderate (HCC)  F33.1 busPIRone (BUSPAR) 30 MG tablet  3. Insomnia, unspecified type   G47.00     Receiving Psychotherapy: No    RECOMMENDATIONS:  Continue BuSpar 30 mg 1 twice daily Continue mirtazapine 30 mg, 1/4-1 nightly as needed sleep. Continue Aricept 10 mg nightly per neurology. Continue Namenda X are 28 mg daily per neurology. Return in 3 months.  Sarah Overly, PA-C

## 2019-07-30 ENCOUNTER — Encounter: Payer: Self-pay | Admitting: Podiatry

## 2019-07-30 ENCOUNTER — Ambulatory Visit (INDEPENDENT_AMBULATORY_CARE_PROVIDER_SITE_OTHER): Payer: Medicare Other | Admitting: Podiatry

## 2019-07-30 ENCOUNTER — Other Ambulatory Visit: Payer: Self-pay

## 2019-07-30 DIAGNOSIS — M79676 Pain in unspecified toe(s): Secondary | ICD-10-CM

## 2019-07-30 DIAGNOSIS — M2042 Other hammer toe(s) (acquired), left foot: Secondary | ICD-10-CM | POA: Diagnosis not present

## 2019-07-30 DIAGNOSIS — M2041 Other hammer toe(s) (acquired), right foot: Secondary | ICD-10-CM | POA: Diagnosis not present

## 2019-07-30 DIAGNOSIS — B351 Tinea unguium: Secondary | ICD-10-CM

## 2019-07-30 NOTE — Progress Notes (Signed)
Complaint:  Visit Type: Patient returns to my office for continued preventative foot care services. Complaint: Patient states" my nails have grown long and thick and become painful to walk and wear shoes" Patient has been diagnosed with DM with no foot complications. The patient presents for preventative foot care services. No changes to ROS.  Patient is  brought to the office by her son.  Podiatric Exam: Vascular: dorsalis pedis and posterior tibial pulses are palpable bilateral. Capillary return is immediate. Temperature gradient is WNL. Skin turgor WNL  Sensorium: Normal Semmes Weinstein monofilament test. Normal tactile sensation bilaterally. Nail Exam: Pt has thick disfigured discolored nails with subungual debris noted bilateral entire nail hallux through fifth toenails Ulcer Exam: There is no evidence of ulcer or pre-ulcerative changes or infection. Orthopedic Exam: Muscle tone and strength are WNL. No limitations in general ROM. No crepitus or effusions noted. Foot type and digits show no abnormalities. HAV  B/L.  Hammer toes 2-5  B/L. 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   Debridement of mycotic and hypertrophic toenails, 1 through 5 bilateral and clearing of subungual debris. No ulceration, no infection noted. ABN signed for 2019 [place] porokeratosis.  .Return Visit-Office Procedure: Patient instructed to return to the office for a follow up visit 3 months for continued evaluation and treatment.    Gardiner Barefoot DPM

## 2019-09-16 ENCOUNTER — Emergency Department (HOSPITAL_BASED_OUTPATIENT_CLINIC_OR_DEPARTMENT_OTHER): Payer: Medicare Other

## 2019-09-16 ENCOUNTER — Emergency Department (HOSPITAL_COMMUNITY): Payer: Medicare Other

## 2019-09-16 ENCOUNTER — Encounter (HOSPITAL_COMMUNITY)
Admission: EM | Disposition: A | Discharge status: Home / Self Care | Payer: Self-pay | Source: Home / Self Care | Attending: Cardiology

## 2019-09-16 ENCOUNTER — Encounter (HOSPITAL_COMMUNITY): Payer: Self-pay | Admitting: Emergency Medicine

## 2019-09-16 ENCOUNTER — Other Ambulatory Visit: Payer: Self-pay

## 2019-09-16 ENCOUNTER — Inpatient Hospital Stay (HOSPITAL_COMMUNITY)
Admission: EM | Admit: 2019-09-16 | Discharge: 2019-09-17 | DRG: 281 | Disposition: A | Discharge status: Home / Self Care | Payer: Medicare Other | Attending: Cardiology | Admitting: Cardiology

## 2019-09-16 DIAGNOSIS — M419 Scoliosis, unspecified: Secondary | ICD-10-CM | POA: Diagnosis present

## 2019-09-16 DIAGNOSIS — G4730 Sleep apnea, unspecified: Secondary | ICD-10-CM | POA: Diagnosis present

## 2019-09-16 DIAGNOSIS — I440 Atrioventricular block, first degree: Secondary | ICD-10-CM | POA: Diagnosis present

## 2019-09-16 DIAGNOSIS — M1990 Unspecified osteoarthritis, unspecified site: Secondary | ICD-10-CM | POA: Diagnosis present

## 2019-09-16 DIAGNOSIS — Z86711 Personal history of pulmonary embolism: Secondary | ICD-10-CM | POA: Diagnosis present

## 2019-09-16 DIAGNOSIS — I361 Nonrheumatic tricuspid (valve) insufficiency: Secondary | ICD-10-CM | POA: Diagnosis not present

## 2019-09-16 DIAGNOSIS — I214 Non-ST elevation (NSTEMI) myocardial infarction: Secondary | ICD-10-CM | POA: Diagnosis present

## 2019-09-16 DIAGNOSIS — Z885 Allergy status to narcotic agent status: Secondary | ICD-10-CM | POA: Diagnosis not present

## 2019-09-16 DIAGNOSIS — Z20822 Contact with and (suspected) exposure to covid-19: Secondary | ICD-10-CM | POA: Diagnosis present

## 2019-09-16 DIAGNOSIS — M549 Dorsalgia, unspecified: Secondary | ICD-10-CM | POA: Diagnosis present

## 2019-09-16 DIAGNOSIS — Z87891 Personal history of nicotine dependence: Secondary | ICD-10-CM | POA: Diagnosis not present

## 2019-09-16 DIAGNOSIS — I10 Essential (primary) hypertension: Secondary | ICD-10-CM | POA: Diagnosis present

## 2019-09-16 DIAGNOSIS — I110 Hypertensive heart disease with heart failure: Secondary | ICD-10-CM | POA: Diagnosis present

## 2019-09-16 DIAGNOSIS — G8929 Other chronic pain: Secondary | ICD-10-CM | POA: Diagnosis present

## 2019-09-16 DIAGNOSIS — R079 Chest pain, unspecified: Secondary | ICD-10-CM

## 2019-09-16 DIAGNOSIS — E785 Hyperlipidemia, unspecified: Secondary | ICD-10-CM | POA: Diagnosis present

## 2019-09-16 DIAGNOSIS — I5181 Takotsubo syndrome: Secondary | ICD-10-CM | POA: Diagnosis not present

## 2019-09-16 DIAGNOSIS — Z86718 Personal history of other venous thrombosis and embolism: Secondary | ICD-10-CM

## 2019-09-16 DIAGNOSIS — Z7982 Long term (current) use of aspirin: Secondary | ICD-10-CM

## 2019-09-16 DIAGNOSIS — Z8673 Personal history of transient ischemic attack (TIA), and cerebral infarction without residual deficits: Secondary | ICD-10-CM | POA: Diagnosis not present

## 2019-09-16 DIAGNOSIS — I5042 Chronic combined systolic (congestive) and diastolic (congestive) heart failure: Secondary | ICD-10-CM | POA: Diagnosis present

## 2019-09-16 DIAGNOSIS — Z96652 Presence of left artificial knee joint: Secondary | ICD-10-CM | POA: Diagnosis present

## 2019-09-16 DIAGNOSIS — I340 Nonrheumatic mitral (valve) insufficiency: Secondary | ICD-10-CM | POA: Diagnosis not present

## 2019-09-16 DIAGNOSIS — I712 Thoracic aortic aneurysm, without rupture: Secondary | ICD-10-CM | POA: Diagnosis present

## 2019-09-16 HISTORY — DX: Takotsubo syndrome: I51.81

## 2019-09-16 HISTORY — PX: LEFT HEART CATH AND CORONARY ANGIOGRAPHY: CATH118249

## 2019-09-16 LAB — CBC
HCT: 40.4 % (ref 36.0–46.0)
Hemoglobin: 12.9 g/dL (ref 12.0–15.0)
MCH: 33 pg (ref 26.0–34.0)
MCHC: 31.9 g/dL (ref 30.0–36.0)
MCV: 103.3 fL — ABNORMAL HIGH (ref 80.0–100.0)
Platelets: 176 10*3/uL (ref 150–400)
RBC: 3.91 MIL/uL (ref 3.87–5.11)
RDW: 13.1 % (ref 11.5–15.5)
WBC: 6.8 10*3/uL (ref 4.0–10.5)
nRBC: 0 % (ref 0.0–0.2)

## 2019-09-16 LAB — COMPREHENSIVE METABOLIC PANEL
ALT: 17 U/L (ref 0–44)
AST: 21 U/L (ref 15–41)
Albumin: 3.6 g/dL (ref 3.5–5.0)
Alkaline Phosphatase: 46 U/L (ref 38–126)
Anion gap: 10 (ref 5–15)
BUN: 13 mg/dL (ref 8–23)
CO2: 31 mmol/L (ref 22–32)
Calcium: 9.3 mg/dL (ref 8.9–10.3)
Chloride: 101 mmol/L (ref 98–111)
Creatinine, Ser: 0.93 mg/dL (ref 0.44–1.00)
GFR calc Af Amer: >60 mL/min (ref >60)
GFR calc non Af Amer: 58 mL/min — ABNORMAL LOW (ref >60)
Glucose, Bld: 123 mg/dL — ABNORMAL HIGH (ref 70–99)
Potassium: 3.6 mmol/L (ref 3.5–5.1)
Sodium: 142 mmol/L (ref 135–145)
Total Bilirubin: 1 mg/dL (ref 0.3–1.2)
Total Protein: 7 g/dL (ref 6.5–8.1)

## 2019-09-16 LAB — BRAIN NATRIURETIC PEPTIDE: B Natriuretic Peptide: 91.2 pg/mL (ref 0.0–100.0)

## 2019-09-16 LAB — HEMOGLOBIN A1C
Hgb A1c MFr Bld: 5.6 % (ref 4.8–5.6)
Mean Plasma Glucose: 114.02 mg/dL

## 2019-09-16 LAB — MAGNESIUM: Magnesium: 2 mg/dL (ref 1.7–2.4)

## 2019-09-16 LAB — SARS CORONAVIRUS 2 (TAT 6-24 HRS): SARS Coronavirus 2: NEGATIVE

## 2019-09-16 LAB — TROPONIN I (HIGH SENSITIVITY)
Troponin I (High Sensitivity): 53 ng/L — ABNORMAL HIGH (ref <18)
Troponin I (High Sensitivity): 553 ng/L (ref <18)

## 2019-09-16 LAB — ECHOCARDIOGRAM COMPLETE
Height: 62 in
Weight: 3040.58 oz

## 2019-09-16 LAB — TSH: TSH: 0.113 u[IU]/mL — ABNORMAL LOW (ref 0.350–4.500)

## 2019-09-16 SURGERY — LEFT HEART CATH AND CORONARY ANGIOGRAPHY
Anesthesia: LOCAL

## 2019-09-16 MED ORDER — HEPARIN SODIUM (PORCINE) 1000 UNIT/ML IJ SOLN
INTRAMUSCULAR | Status: AC
  Filled 2019-09-16: qty 1

## 2019-09-16 MED ORDER — ACETAMINOPHEN 325 MG PO TABS: 650.0000 mg | ORAL_TABLET | ORAL | Status: DC | PRN

## 2019-09-16 MED ORDER — ASPIRIN 81 MG PO CHEW
324.0000 mg | CHEWABLE_TABLET | Freq: Once | ORAL | Status: AC
  Administered 2019-09-16: 324 mg via ORAL
  Filled 2019-09-16: qty 4

## 2019-09-16 MED ORDER — NITROGLYCERIN 0.4 MG SL SUBL
0.4000 mg | SUBLINGUAL_TABLET | SUBLINGUAL | Status: DC | PRN
  Filled 2019-09-16: qty 1

## 2019-09-16 MED ORDER — HEPARIN BOLUS VIA INFUSION
4000.0000 [IU] | Freq: Once | INTRAVENOUS | Status: AC
  Administered 2019-09-16: 4000 [IU] via INTRAVENOUS
  Filled 2019-09-16: qty 4000

## 2019-09-16 MED ORDER — SODIUM CHLORIDE 0.9% FLUSH: 3.0000 mL | INTRAVENOUS | Status: DC | PRN

## 2019-09-16 MED ORDER — FENTANYL CITRATE (PF) 100 MCG/2ML IJ SOLN
INTRAMUSCULAR | Status: DC | PRN
  Administered 2019-09-16: 25 ug via INTRAVENOUS

## 2019-09-16 MED ORDER — SODIUM CHLORIDE 0.9% FLUSH
3.0000 mL | Freq: Two times a day (BID) | INTRAVENOUS | Status: DC
  Administered 2019-09-16 – 2019-09-17 (×2): 3 mL via INTRAVENOUS

## 2019-09-16 MED ORDER — SODIUM CHLORIDE 0.9% FLUSH: 3.0000 mL | Freq: Two times a day (BID) | INTRAVENOUS | Status: DC

## 2019-09-16 MED ORDER — HEPARIN (PORCINE) IN NACL 1000-0.9 UT/500ML-% IV SOLN
INTRAVENOUS | Status: DC | PRN
  Administered 2019-09-16 (×2): 500 mL

## 2019-09-16 MED ORDER — VERAPAMIL HCL 2.5 MG/ML IV SOLN
INTRAVENOUS | Status: AC
  Filled 2019-09-16: qty 2

## 2019-09-16 MED ORDER — VERAPAMIL HCL 2.5 MG/ML IV SOLN
INTRAVENOUS | Status: DC | PRN
  Administered 2019-09-16: 16:00:00 10 mL via INTRA_ARTERIAL

## 2019-09-16 MED ORDER — MIDAZOLAM HCL 2 MG/2ML IJ SOLN
INTRAMUSCULAR | Status: AC
  Filled 2019-09-16: qty 2

## 2019-09-16 MED ORDER — LIDOCAINE HCL (PF) 1 % IJ SOLN
INTRAMUSCULAR | Status: AC
  Filled 2019-09-16: qty 30

## 2019-09-16 MED ORDER — HEPARIN (PORCINE) 25000 UT/250ML-% IV SOLN
900.0000 [IU]/h | INTRAVENOUS | Status: DC
  Administered 2019-09-16: 900 [IU]/h via INTRAVENOUS
  Filled 2019-09-16: qty 250

## 2019-09-16 MED ORDER — SODIUM CHLORIDE 0.9 % IV SOLN: 250.0000 mL | INTRAVENOUS | Status: DC | PRN

## 2019-09-16 MED ORDER — NITROGLYCERIN 0.4 MG SL SUBL: 0.4000 mg | SUBLINGUAL_TABLET | SUBLINGUAL | Status: DC | PRN

## 2019-09-16 MED ORDER — MIRTAZAPINE 15 MG PO TABS: 7.5000 mg | ORAL_TABLET | Freq: Every day | ORAL | Status: DC

## 2019-09-16 MED ORDER — ALPRAZOLAM 0.25 MG PO TABS: 0.2500 mg | ORAL_TABLET | Freq: Two times a day (BID) | ORAL | Status: DC | PRN

## 2019-09-16 MED ORDER — ENOXAPARIN SODIUM 40 MG/0.4ML ~~LOC~~ SOLN
40.0000 mg | SUBCUTANEOUS | Status: DC
  Administered 2019-09-17: 40 mg via SUBCUTANEOUS
  Filled 2019-09-16: qty 0.4

## 2019-09-16 MED ORDER — ONDANSETRON HCL 4 MG/2ML IJ SOLN: 4.0000 mg | Freq: Four times a day (QID) | INTRAMUSCULAR | Status: DC | PRN

## 2019-09-16 MED ORDER — LIDOCAINE HCL (PF) 1 % IJ SOLN
INTRAMUSCULAR | Status: DC | PRN
  Administered 2019-09-16: 2 mL via INTRADERMAL

## 2019-09-16 MED ORDER — MEMANTINE HCL ER 28 MG PO CP24
28.0000 mg | ORAL_CAPSULE | Freq: Every day | ORAL | Status: DC
  Administered 2019-09-16 – 2019-09-17 (×2): 28 mg via ORAL
  Filled 2019-09-16 (×2): qty 1

## 2019-09-16 MED ORDER — LABETALOL HCL 5 MG/ML IV SOLN: 10.0000 mg | INTRAVENOUS | Status: AC | PRN

## 2019-09-16 MED ORDER — IOHEXOL 350 MG/ML SOLN
INTRAVENOUS | Status: DC | PRN
  Administered 2019-09-16: 70 mL

## 2019-09-16 MED ORDER — ZOLPIDEM TARTRATE 5 MG PO TABS: 5.0000 mg | ORAL_TABLET | Freq: Every evening | ORAL | Status: DC | PRN

## 2019-09-16 MED ORDER — HEPARIN SODIUM (PORCINE) 1000 UNIT/ML IJ SOLN
INTRAMUSCULAR | Status: DC | PRN
  Administered 2019-09-16: 4500 [IU] via INTRAVENOUS

## 2019-09-16 MED ORDER — SODIUM CHLORIDE 0.9 % WEIGHT BASED INFUSION: 1.0000 mL/kg/h | INTRAVENOUS | Status: DC

## 2019-09-16 MED ORDER — SODIUM CHLORIDE 0.9 % IV SOLN: INTRAVENOUS | Status: AC

## 2019-09-16 MED ORDER — HYDRALAZINE HCL 20 MG/ML IJ SOLN: 10.0000 mg | INTRAMUSCULAR | Status: AC | PRN

## 2019-09-16 MED ORDER — FENTANYL CITRATE (PF) 100 MCG/2ML IJ SOLN
INTRAMUSCULAR | Status: AC
  Filled 2019-09-16: qty 2

## 2019-09-16 MED ORDER — MIDAZOLAM HCL 2 MG/2ML IJ SOLN
INTRAMUSCULAR | Status: DC | PRN
  Administered 2019-09-16: 1 mg via INTRAVENOUS

## 2019-09-16 MED ORDER — HEPARIN (PORCINE) IN NACL 1000-0.9 UT/500ML-% IV SOLN
INTRAVENOUS | Status: AC
  Filled 2019-09-16: qty 1000

## 2019-09-16 MED ORDER — MIRTAZAPINE 15 MG PO TABS: 7.5000 mg | ORAL_TABLET | Freq: Every evening | ORAL | Status: DC | PRN

## 2019-09-16 MED ORDER — BUSPIRONE HCL 5 MG PO TABS
15.0000 mg | ORAL_TABLET | Freq: Two times a day (BID) | ORAL | Status: DC
  Administered 2019-09-16 – 2019-09-17 (×3): 15 mg via ORAL
  Filled 2019-09-16 (×3): qty 1

## 2019-09-16 MED ORDER — LISINOPRIL 40 MG PO TABS
40.0000 mg | ORAL_TABLET | Freq: Every day | ORAL | Status: DC
  Administered 2019-09-17: 40 mg via ORAL
  Filled 2019-09-16: qty 1

## 2019-09-16 MED ORDER — ATORVASTATIN CALCIUM 10 MG PO TABS
20.0000 mg | ORAL_TABLET | Freq: Every evening | ORAL | Status: DC
  Administered 2019-09-16: 20 mg via ORAL
  Filled 2019-09-16: qty 2

## 2019-09-16 MED ORDER — AMLODIPINE BESYLATE 10 MG PO TABS
10.0000 mg | ORAL_TABLET | Freq: Every day | ORAL | Status: DC
  Administered 2019-09-17: 10 mg via ORAL
  Filled 2019-09-16: qty 1

## 2019-09-16 MED ORDER — ASPIRIN EC 81 MG PO TBEC
81.0000 mg | DELAYED_RELEASE_TABLET | Freq: Every day | ORAL | Status: DC
  Administered 2019-09-17: 81 mg via ORAL
  Filled 2019-09-16: qty 1

## 2019-09-16 MED ORDER — DONEPEZIL HCL 5 MG PO TABS
10.0000 mg | ORAL_TABLET | Freq: Every day | ORAL | Status: DC
  Administered 2019-09-16: 10 mg via ORAL
  Filled 2019-09-16: qty 2

## 2019-09-16 MED ORDER — HYDROCHLOROTHIAZIDE 25 MG PO TABS
25.0000 mg | ORAL_TABLET | Freq: Every day | ORAL | Status: DC
  Administered 2019-09-17: 25 mg via ORAL
  Filled 2019-09-16: qty 1

## 2019-09-16 MED ORDER — SODIUM CHLORIDE 0.9 % WEIGHT BASED INFUSION: 3.0000 mL/kg/h | INTRAVENOUS | Status: DC

## 2019-09-16 MED ORDER — HYDRALAZINE HCL 25 MG PO TABS
25.0000 mg | ORAL_TABLET | Freq: Once | ORAL | Status: AC
  Administered 2019-09-16: 25 mg via ORAL
  Filled 2019-09-16: qty 1

## 2019-09-16 SURGICAL SUPPLY — 12 items
CATH INFINITI 5FR ANG PIGTAIL (CATHETERS) ×2 IMPLANT
CATH INFINITI 5FR JL5 (CATHETERS) ×2 IMPLANT
CATH OPTITORQUE TIG 4.0 5F (CATHETERS) ×2 IMPLANT
DEVICE RAD COMP TR BAND LRG (VASCULAR PRODUCTS) ×2 IMPLANT
GLIDESHEATH SLEND SS 6F .021 (SHEATH) ×2 IMPLANT
GUIDEWIRE INQWIRE 1.5J.035X260 (WIRE) ×1 IMPLANT
INQWIRE 1.5J .035X260CM (WIRE) ×2
KIT HEART LEFT (KITS) ×2 IMPLANT
PACK CARDIAC CATHETERIZATION (CUSTOM PROCEDURE TRAY) ×2 IMPLANT
SYR MEDRAD MARK 7 150ML (SYRINGE) ×2 IMPLANT
TRANSDUCER W/STOPCOCK (MISCELLANEOUS) ×2 IMPLANT
TUBING CIL FLEX 10 FLL-RA (TUBING) ×2 IMPLANT

## 2019-09-16 NOTE — ED Notes (Signed)
Patient denies pain and is resting comfortably.  

## 2019-09-16 NOTE — ED Notes (Signed)
[name] (Son#[phone])

## 2019-09-16 NOTE — H&P (Signed)
Cardiology Admission History and Physical:   Patient ID: [name] MRN: [id number]; DOB: [birth year]   Admission date: 09/16/2019  Primary Care Provider: [name], MD Primary Cardiologist: New Primary Electrophysiologist:  None   Chief Complaint: Chest pain  Patient Profile:   [name] is a 79 y.o. female with hypertension, asthma, scoliosis and chronic back pain, hyperlipidemia, PE/DVT (in 2018 previously on Eliquis), remote tobacco use, AAA (4.2 cm) who is being evaluated for chest pain.  History of Present Illness:   [name] not been seen by [facility] in the past. Past medical history as above. Denies history of MI or stent. In 09/13/2016 patient was admitted to the hospital with LLE DVT and bilateral PE with right heart strain. Echo during admission showed EF of 60 to 65%, moderate LVH, normal wall motion, grade 1 diastolic dysfunction, mild AR, mildly dilated LA. CT of the chest showed incidental 4.5 ascending aortic aneurysm for which she began to follow with Dr. Laneta Simmers. Last visit was 08/26/2018 where follow-up CTA of the chest showed stable aneurysm of 4.2 cm. Patient is not a good surgical candidate given age and comorbidities. Dr. Laneta Simmers did not think further CT images were indicated given small size of aneurysm at her age.   The patient presented to the ED 09/16/2019 for chest pain. The patient noticed the pain after waking up at 6 AM. She was sitting in her bed when she felt a sudden onset left-sided pain. It was pressure like, 10/10. Pain radiated down her left arm. No associated sob, nausea, vomiting, or diaphoresis. She called her son who arrived about 30 minutes later. By that time the pain had eased off a little. Nothing made the pain worse or better. She has never felt this pain before. Patient does take Lasix as needed for swelling (she is not sure how many she takes since her son manages her medications). She denies worsening lower leg edema and  orthopnea.  In the ED blood pressure elevated with systolics 170s to 180s.  Heart rate 40s to 50s, afebrile, Oxygen 96%. Labs showed potassium 3.6, glucose 123, creatinine 0.93.  LFTs within normal limits. WBC 6.8, hemoglobin 12.9. HS troponin 53.  Covid pending.  Chest x-ray negative for acute process.  EKG shows sinus bradycardia with possible first AV degree block, no ST T wave abnormality. Second troponin came back at 533 and cardiology was consulted for admission.  Patient quit smoking over 20 years ago. No alcohol or drug use. She lives with her granddaughter. Patient is able to care for herself with some help from her family.     Heart Pathway Score:  HEAR Score: 4  Past Medical History:  Diagnosis Date  . Arthritis   . Asthma   . Chronic back pain   . Depression   . History of pulmonary embolus (PE)   . Hx-TIA (transient ischemic attack)   . Hyperlipemia   . Hypertension   . Personal history of DVT (deep vein thrombosis)   . Scoliosis   . Sleep apnea    cpap - settingsi at 2     Past Surgical History:  Procedure Laterality Date  . ARTHROSCOPIC REPAIR ACL  02/03/2004  . OOPHORECTOMY  1970  . SPINE SURGERY  03/31/2006  . TOTAL KNEE ARTHROPLASTY Left 05/17/2016   Procedure: LEFT TOTAL KNEE ARTHROPLASTY;  Surgeon: Kathryne Hitch, MD;  Location: [facility];  Service: Orthopedics;  Laterality: Left;  Adductor Block; Spinal to General  . TUBAL LIGATION  1983     Medications Prior to Admission: Prior to Admission medications   Medication Sig Start Date End Date Taking? Authorizing Provider  aspirin EC 325 MG tablet Take 325 mg by mouth daily.    [provider]  atorvastatin (LIPITOR) 20 MG tablet Take 20 mg by mouth daily.     [provider]  busPIRone (BUSPAR) 30 MG tablet Take 1 tablet (30 mg total) by mouth 2 (two) times daily. 07/06/19   Cherie Ouch, PA-C  cholecalciferol (VITAMIN D3) 25 MCG (1000 UT) tablet Take 1,000 Units by mouth daily.     [provider]  donepezil (ARICEPT) 10 MG tablet Take 10 mg by mouth at bedtime.     [provider]  furosemide (LASIX) 40 MG tablet Take 1 tablet (40 mg total) by mouth daily as needed for edema. 10/02/16   Meredeth Ide, MD  hydrochlorothiazide (HYDRODIURIL) 25 MG tablet  04/20/18   [provider]  lisinopril (PRINIVIL,ZESTRIL) 40 MG tablet Take 40 mg by mouth daily.     [provider]  memantine (NAMENDA XR) 28 MG CP24 24 hr capsule  04/28/17   [provider]  mirtazapine (REMERON) 30 MG tablet Take 0.5-1 tablets (15-30 mg total) by mouth at bedtime. 07/06/19   Cherie Ouch, PA-C  Multiple Vitamin (MULTIVITAMIN WITH MINERALS) TABS tablet Take 1 tablet by mouth daily.     [provider]  oxyCODONE (ROXICODONE) 15 MG immediate release tablet  09/30/18   [provider]  potassium chloride (K-DUR) 10 MEQ tablet  02/18/19   [provider]  RESTASIS 0.05 % ophthalmic emulsion  12/15/18   [provider]  Zinc 25 MG TABS Take by mouth.    [provider]     Allergies:    Allergies  Allergen Reactions  . Codeine Itching    Social History:   Social History   Socioeconomic History  . Marital status: Widowed    Spouse name: Chrissie Noa  . Number of children: 2  . Years of education: 10  . Highest education level: Not on file  Occupational History    Employer: RETIRED  Tobacco Use  . Smoking status: Former Smoker    Packs/day: 1.00    Years: 10.00    Pack years: 10.00    Types: Cigarettes    Quit date: 08/12/1973    Years since quitting: 46.1  . Smokeless tobacco: Never Used  Substance and Sexual Activity  . Alcohol use: No  . Drug use: No  . Sexual activity: Not on file  Other Topics Concern  . Not on file  Social History Narrative   Patient lives at home with spouse.   Caffeine Use: occasionally   Social Determinants of Health   Financial Resource Strain:   . Difficulty of Paying  Living Expenses: Not on file  Food Insecurity:   . Worried About Programme researcher, broadcasting/film/video in the Last Year: Not on file  . Ran Out of Food in the Last Year: Not on file  Transportation Needs:   . Lack of Transportation (Medical): Not on file  . Lack of Transportation (Non-Medical): Not on file  Physical Activity:   . Days of Exercise per Week: Not on file  . Minutes of Exercise per Session: Not on file  Stress:   . Feeling of Stress : Not on file  Social Connections:   . Frequency of Communication with Friends and Family: Not on file  . Frequency  of Social Gatherings with Friends and Family: Not on file  . Attends Religious Services: Not on file  . Active Member of Clubs or Organizations: Not on file  . Attends Archivist Meetings: Not on file  . Marital Status: Not on file  Intimate Partner Violence:   . Fear of Current or Ex-Partner: Not on file  . Emotionally Abused: Not on file  . Physically Abused: Not on file  . Sexually Abused: Not on file    Family History:   The patient's family history includes Other in her father.    ROS:  Please see the history of present illness.  All other ROS reviewed and negative.     Physical Exam/Data:   Vitals:   09/16/19 1000 09/16/19 1015 09/16/19 1030 09/16/19 1045  BP: (!) 181/84 (!) 168/80 (!) 169/81 (!) 164/83  Pulse: (!) 45 (!) 49 (!) 48 (!) 49  Resp: 11 12 11 15   Temp:      TempSrc:      SpO2: 95% 96% 95% 96%  Weight:      Height:       No intake or output data in the 24 hours ending 09/16/19 1154 Last 3 Weights 09/16/2019 08/26/2018 08/20/2017  Weight (lbs) 190 lb 0.6 oz 190 lb 195 lb  Weight (kg) 86.2 kg 86.183 kg 88.451 kg     Body mass index is 34.76 kg/m.  General:  Well nourished, well developed, in no acute distress HEENT: normal Lymph: no adenopathy Neck: no JVD Endocrine:  No thryomegaly Vascular: No carotid bruits; FA pulses 2+ bilaterally without bruits  Cardiac:  normal S1, S2; RRR; no murmur  Lungs:   clear to auscultation bilaterally, no wheezing, rhonchi or rales  Abd: soft, nontender, no hepatomegaly   Ext: Trace edema Musculoskeletal:  No deformities, BUE and BLE strength normal and equal Skin: warm and dry  Neuro:  CNs 2-12 intact, no focal abnormalities noted Psych:  Normal affect    EKG:  The ECG that was done 09/16/2019 was personally reviewed and demonstrates sinus bradycardia 55 bpm, LAD, possible first-degree AV block, no ST-T wave changes  Relevant CV Studies:  Echo 2018 Study Conclusions   - Left ventricle: The cavity size was normal. Wall thickness was  increased in a pattern of moderate LVH. Systolic function was  normal. The estimated ejection fraction was in the range of 60%  to 65%. Wall motion was normal; there were no regional wall  motion abnormalities. Doppler parameters are consistent with  abnormal left ventricular relaxation (grade 1 diastolic  dysfunction).  - Aortic valve: There was mild regurgitation.  - Left atrium: The atrium was mildly dilated.  - Atrial septum: No defect or patent foramen ovale was identified.  - Pulmonary arteries: PA peak pressure: 52 mm Hg (S).   -------------------------------------------------------------------  Laboratory Data:  High Sensitivity Troponin:   Recent Labs  Lab 09/16/19 0750 09/16/19 0952  TROPONINIHS 53* 553*      Chemistry Recent Labs  Lab 09/16/19 0750  NA 142  K 3.6  CL 101  CO2 31  GLUCOSE 123*  BUN 13  CREATININE 0.93  CALCIUM 9.3  GFRNONAA 58*  GFRAA >60  ANIONGAP 10    Recent Labs  Lab 09/16/19 0750  PROT 7.0  ALBUMIN 3.6  AST 21  ALT 17  ALKPHOS 46  BILITOT 1.0   Hematology Recent Labs  Lab 09/16/19 0750  WBC 6.8  RBC 3.91  HGB 12.9  HCT 40.4  MCV  103.3*  MCH 33.0  MCHC 31.9  RDW 13.1  PLT 176   BNPNo results for input(s): BNP, PROBNP in the last 168 hours.  DDimer No results for input(s): DDIMER in the last 168 hours.   Radiology/Studies:  DG  Chest Port 1 View  Result Date: 09/16/2019 CLINICAL DATA:  Chest pain EXAM: PORTABLE CHEST 1 VIEW COMPARISON:  09/28/2016 FINDINGS: There is mild bilateral interstitial thickening. There is no focal consolidation. There is no pleural effusion or pneumothorax. The heart and mediastinal contours are unremarkable. 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 IMPRESSION: No active disease. Electronically Signed   By: Elige Ko   On: 09/16/2019 08:34    TIMI Risk Score for Unstable Angina or Non-ST Elevation MI:   The patient's TIMI risk score is 5, which indicates a 26% risk of all cause mortality, new or recurrent myocardial infarction or need for urgent revascularization in the next 14 days.   Assessment and Plan:   Non-STEMI Patient presented with sudden onset left-sided chest pain.  On arrival blood pressure severely elevated.  Initial HS troponin 53>553.  EKG with no ischemic changes. Chest x-ray negative -Chest pain possibly ACS given rise in troponin -She was started on IV heparin -Continue to trend troponin -Check an echo -Patient is on aspirin 325 mg daily for heart? Would decrease to 81 mg daily -No beta-blocker due to bradycardia at baseline -Would admit for cardiac cath. Patient is NPO. Creatinine stable Risks and benefits of cardiac catheterization have been discussed with the patient.  These include bleeding, infection, kidney damage, stroke, heart attack, death.  The patient understands these risks and is willing to proceed.  Hypertension -Pressures severely elevated on admission. Most recent 153/79 -At home patient is on lisinopril 40 mg, HCTZ 25 mg and takes intermittent lasix -Echo as above  Chronic diastolic heart failure -Echo 08/13/2016 shows EF of 60 to 65% with grade 1 diastolic dysfunction -She takes Lasix 40 mg as needed at home (unsure how often she takes it weekly) -Not on beta-blocker -Continue with lisinopril - Patient has minimal edema on  exam.   Hyperlipidemia  -Continue statin -Check fasting lipids in the a.m.  Ascending aortic aneurysm -Follows with Dr. Laneta Simmers.  Last visit was 08/26/2018 -CT showed stable aneurysm 4.2 cm -Deemed not a surgical candidate    Severity of Illness: The appropriate patient status for this patient is INPATIENT. Inpatient status is judged to be reasonable and necessary in order to provide the required intensity of service to ensure the patient's safety. The patient's presenting symptoms, physical exam findings, and initial radiographic and laboratory data in the context of their chronic comorbidities is felt to place them at high risk for further clinical deterioration. Furthermore, it is not anticipated that the patient will be medically stable for discharge from the hospital within 2 midnights of admission. The following factors support the patient status of inpatient.   " The patient's presenting symptoms include chest pain. " The worrisome physical exam findings include chest pain. " The initial radiographic and laboratory data are worrisome because of elevated troponin. " The chronic co-morbidities include hypertension, hyperlipidemia.   * I certify that at the point of admission it is my clinical judgment that the patient will require inpatient hospital care spanning beyond 2 midnights from the point of admission due to high intensity of service, high risk for further deterioration and high frequency of surveillance required.*    For questions or updates, please contact [facility] Please  consult [email or web address] for contact info under        Signed, Dera Vanaken David Stall, PA-C  09/16/2019 11:54 AM

## 2019-09-16 NOTE — Progress Notes (Signed)
ANTICOAGULATION CONSULT NOTE - Initial Consult  Pharmacy Consult for heparin Indication: chest pain/ACS  Allergies  Allergen Reactions  . Codeine Itching    Patient Measurements: Height: 5\' 2"  (157.5 cm) Weight: 190 lb 0.6 oz (86.2 kg) IBW/kg (Calculated) : 50.1 Heparin Dosing Weight: 69.7kg  Vital Signs: Temp: 98.3 F (36.8 C) (02/04 0741) Temp Source: Oral (02/04 0741) BP: 182/81 (02/04 0915) Pulse Rate: 50 (02/04 0915)  Labs: Recent Labs    09/16/19 0750  HGB 12.9  HCT 40.4  PLT 176  CREATININE 0.93  TROPONINIHS 53*    Estimated Creatinine Clearance: 49.9 mL/min (by C-G formula based on SCr of 0.93 mg/dL).   Medical History: Past Medical History:  Diagnosis Date  . Arthritis   . Asthma   . Chronic back pain   . Depression   . History of pulmonary embolus (PE)   . Hx-TIA (transient ischemic attack)   . Hyperlipemia   . Hypertension   . Personal history of DVT (deep vein thrombosis)   . Scoliosis   . Sleep apnea    cpap - settingsi at 3     Medications:  Infusions:  . heparin 900 Units/hr (09/16/19 0949)    Assessment: 32 yof presented to the ED with CP. Troponin slightly elevated. To start IV heparin. Baseline CBC is WNL. She was on apixaban in the past for a PE.   Goal of Therapy:  Heparin level 0.3-0.7 units/ml Monitor platelets by anticoagulation protocol: Yes   Plan:  Heparin bolus 4000 units IV x 1 Heparin gtt 900 units/hr Check an 8 hr heparin level Daily heparin level and CBC  Florabel Faulks, 75 09/16/2019,9:31 AM

## 2019-09-16 NOTE — ED Triage Notes (Signed)
Pt in with c/o sharp, L sided cp that woke her up this am. States the pain radiates down her L arm. Denies any n/v, sob, fevers or cough. Endorses runny nose. States pain to area on palpation

## 2019-09-16 NOTE — ED Provider Notes (Signed)
[facility] EMERGENCY DEPARTMENT Provider Note   CSN: [id number] Arrival date & time: 09/16/19  5681     History Chief Complaint  Patient presents with  . Chest Pain    [name] is a 79 y.o. female.  HPI  79 year old female history of hypertension, hyperlipidemia, TIA, PE presents today complaining of left anterior sharp chest pain that began began at 6 AM when she awoke.  The pain was moderate in nature.  She has taken her normal blood pressure medications.  No other interventions have occurred.  She has not noted anything that has made it better or worse.  The pain is mild to moderate currently.  She does not think she has had any similar pain in the past.  She presents to the ED via private vehicle. Denies Covid symptoms, Covid diagnosis, or Covid exposure.  She does endorse some runny nose states that this has been constant in nature for her.     Past Medical History:  Diagnosis Date  . Arthritis   . Asthma   . Chronic back pain   . Depression   . History of pulmonary embolus (PE)   . Hx-TIA (transient ischemic attack)   . Hyperlipemia   . Hypertension   . Personal history of DVT (deep vein thrombosis)   . Scoliosis   . Sleep apnea    cpap - settingsi at 3     Patient Active Problem List   Diagnosis Date Noted  . Depression 07/29/2018  . Acute pulmonary embolism (HCC) 09/29/2016  . Acute encephalopathy 09/28/2016  . Left knee pain 09/28/2016  . Left leg swelling 09/28/2016  . Hilar mass 09/28/2016  . OSA on CPAP 09/28/2016  . HTN (hypertension) 09/28/2016  . HLD (hyperlipidemia) 09/28/2016  . Chronic pain 09/28/2016  . Cellulitis of left leg 06/14/2016  . Osteoarthritis of left knee 05/17/2016  . 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 ARTHROSCOPIC REPAIR ACL  02/03/2004  . OOPHORECTOMY  1970  . SPINE SURGERY  03/31/2006  . TOTAL KNEE ARTHROPLASTY Left 05/17/2016    Procedure: LEFT TOTAL KNEE ARTHROPLASTY;  Surgeon: Kathryne Hitch, MD;  Location: [facility];  Service: Orthopedics;  Laterality: Left;  Adductor Block; Spinal to General  . TUBAL LIGATION  1983     OB History   No obstetric history on file.     Family History  Problem Relation Age of Onset  . Other Father        kidney problems    Social History   Tobacco Use  . Smoking status: Former Smoker    Packs/day: 1.00    Years: 10.00    Pack years: 10.00    Types: Cigarettes    Quit date: 08/12/1973    Years since quitting: 46.1  . Smokeless tobacco: Never Used  Substance Use Topics  . Alcohol use: No  . Drug use: No    Home Medications Prior to Admission medications   Medication Sig Start Date End Date Taking? Authorizing Provider  aspirin EC 325 MG tablet Take 325 mg by mouth daily.    [provider]  atorvastatin (LIPITOR) 20 MG tablet Take 20 mg by mouth daily.     [provider]  busPIRone (BUSPAR) 30 MG tablet Take 1 tablet (30 mg total) by mouth 2 (two) times daily. 07/06/19   Cherie Ouch, PA-C  cholecalciferol (VITAMIN D3) 25 MCG (1000 UT)  tablet Take 1,000 Units by mouth daily.    [provider]  donepezil (ARICEPT) 10 MG tablet Take 10 mg by mouth at bedtime.     [provider]  furosemide (LASIX) 40 MG tablet Take 1 tablet (40 mg total) by mouth daily as needed for edema. 10/02/16   Oswald Hillock, MD  hydrochlorothiazide (HYDRODIURIL) 25 MG tablet  04/20/18   [provider]  lisinopril (PRINIVIL,ZESTRIL) 40 MG tablet Take 40 mg by mouth daily.     [provider]  memantine (NAMENDA XR) 28 MG CP24 24 hr capsule  04/28/17   [provider]  mirtazapine (REMERON) 30 MG tablet Take 0.5-1 tablets (15-30 mg total) by mouth at bedtime. 07/06/19   Addison Lank, PA-C  Multiple Vitamin (MULTIVITAMIN WITH MINERALS) TABS tablet Take 1 tablet by mouth daily.     [provider]  oxyCODONE  (ROXICODONE) 15 MG immediate release tablet  09/30/18   [provider]  potassium chloride (K-DUR) 10 MEQ tablet  02/18/19   [provider]  RESTASIS 0.05 % ophthalmic emulsion  12/15/18   [provider]  Zinc 25 MG TABS Take by mouth.    [provider]    Allergies    Codeine  Review of Systems   Review of Systems  Constitutional: Negative.   HENT: Positive for rhinorrhea.   Eyes: Negative.   Respiratory: Negative.   Cardiovascular: Positive for chest pain. Negative for palpitations and leg swelling.  Gastrointestinal: Negative.  Negative for abdominal pain and nausea.  Endocrine: Negative.   Genitourinary: Negative.   Musculoskeletal: Negative.   Skin: Negative.   Allergic/Immunologic: Negative.   Neurological: Negative.   Hematological: Negative.   Psychiatric/Behavioral: Negative.   All other systems reviewed and are negative.   Physical Exam Updated Vital Signs Temp 98.3 F (36.8 C) (Oral)   Physical Exam Vitals and nursing note reviewed.  Constitutional:      General: She is not in acute distress.    Appearance: She is well-developed. She is obese. She is not ill-appearing.  HENT:     Head: Normocephalic.  Eyes:     Pupils: Pupils are equal, round, and reactive to light.  Cardiovascular:     Rate and Rhythm: Normal rate and regular rhythm.     Heart sounds: Normal heart sounds.  Pulmonary:     Effort: Pulmonary effort is normal.     Breath sounds: Normal breath sounds.  Abdominal:     General: Bowel sounds are normal.     Palpations: Abdomen is soft.  Musculoskeletal:        General: Normal range of motion.     Right lower leg: No tenderness. No edema.     Left lower leg: No tenderness. No edema.  Skin:    General: Skin is warm and dry.     Capillary Refill: Capillary refill takes less than 2 seconds.  Neurological:     General: No focal deficit present.     Mental Status: She is alert.     ED Results /  Procedures / Treatments   Labs (all labs ordered are listed, but only abnormal results are displayed) Labs Reviewed  CBC  COMPREHENSIVE METABOLIC PANEL  TROPONIN I (HIGH SENSITIVITY)    EKG EKG Interpretation  Date/Time:  Thursday September 16 2019 07:41:05 EST Ventricular Rate:  55 PR Interval:    QRS Duration: 110 QT Interval:  475 QTC Calculation: 455 R Axis:   -17 Text Interpretation:  Sinus rhythm Borderline prolonged PR interval Borderline left axis deviation Probable anteroseptal infarct, old No significant change since last tracing 18 July 2016 Confirmed by Margarita Grizzle [phone]) on 09/16/2019 7:47:15 AM   Radiology DG Chest Port 1 View  Result Date: 09/16/2019 CLINICAL DATA:  Chest pain EXAM: PORTABLE CHEST 1 VIEW COMPARISON:  09/28/2016 FINDINGS: There is mild bilateral interstitial thickening. There is no focal consolidation. There is no pleural effusion or pneumothorax. The heart and mediastinal contours are unremarkable. 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 IMPRESSION: No active disease. Electronically Signed   By: Elige Ko   On: 09/16/2019 08:34    Procedures .Critical Care Performed by: Margarita Grizzle, MD Authorized by: Margarita Grizzle, MD   Critical care provider statement:    Critical care time (minutes):  45   Critical care end time:  09/16/2019 11:52 AM   Critical care was necessary to treat or prevent imminent or life-threatening deterioration of the following conditions:  Circulatory failure   Critical care was time spent personally by me on the following activities:  Discussions with consultants, evaluation of patient's response to treatment, examination of patient, ordering and performing treatments and interventions, ordering and review of laboratory studies, ordering and review of radiographic studies, pulse oximetry, re-evaluation of patient's condition, obtaining history from patient or surrogate and review of old charts    (including critical care time)  Medications Ordered in ED Medications - No data to display  ED Course  I have reviewed the triage vital signs and the nursing notes.  Pertinent labs & imaging results that were available during my care of the patient were reviewed by me and considered in my medical decision making (see chart for details).  Clinical Course as of Sep 15 1150  Thu Sep 16, 2019  0848 CXR reviewed personally and report read Cbc reviewed   [DR]  0915 Elevated troponin reviewed  Troponin I (High Sensitivity)(!): 53 [DR]    Clinical Course User Index [DR] Margarita Grizzle, MD    9:14 AM  Troponin 53 Discussed with Trish, cardiology to see Plan heparin  10:25 AM Patient pain free.  Awaiting cardiology consult  11:52 AM Repeat troponin 553 Cardiology and echo at bedside- cardiology will admit  MDM Rules/Calculators/A&P                       Final Clinical Impression(s) / ED Diagnoses Final diagnoses:  NSTEMI (non-ST elevated myocardial infarction) [facility])    Rx / DC Orders ED Discharge Orders    None       Margarita Grizzle, MD 09/16/19 1153

## 2019-09-16 NOTE — Progress Notes (Signed)
  Echocardiogram 2D Echocardiogram has been performed.  Delcie Roch 09/16/2019, 12:19 PM

## 2019-09-16 NOTE — Interval H&P Note (Signed)
History and Physical Interval Note:  09/16/2019 3:42 PM  [name]  has presented today for cardiac catheterization, with the diagnosis of NSTEMI.  The various methods of treatment have been discussed with the patient and family. After consideration of risks, benefits and other options for treatment, the patient has consented to  Procedure(s): LEFT HEART CATH AND CORONARY ANGIOGRAPHY (N/A) as a surgical intervention.  The patient's history has been reviewed, patient examined, no change in status, stable for surgery.  I have reviewed the patient's chart and labs.  Questions were answered to the patient's satisfaction.    Cath Lab Visit (complete for each Cath Lab visit)  Clinical Evaluation Leading to the Procedure:   ACS: Yes.    Non-ACS:  N/A  [name]

## 2019-09-16 NOTE — Progress Notes (Signed)
Patient arrived the unit from the ED on a stretcher, assessment completed see flow sheet, placed on tele ccmd notified, patient oriented to room and staff, bed in lowest position, call bell within reach will continue to monitor.

## 2019-09-17 DIAGNOSIS — G4730 Sleep apnea, unspecified: Secondary | ICD-10-CM | POA: Diagnosis present

## 2019-09-17 DIAGNOSIS — I10 Essential (primary) hypertension: Secondary | ICD-10-CM | POA: Diagnosis present

## 2019-09-17 DIAGNOSIS — I5181 Takotsubo syndrome: Secondary | ICD-10-CM

## 2019-09-17 DIAGNOSIS — E785 Hyperlipidemia, unspecified: Secondary | ICD-10-CM | POA: Diagnosis present

## 2019-09-17 DIAGNOSIS — Z8673 Personal history of transient ischemic attack (TIA), and cerebral infarction without residual deficits: Secondary | ICD-10-CM

## 2019-09-17 DIAGNOSIS — Z86711 Personal history of pulmonary embolism: Secondary | ICD-10-CM | POA: Diagnosis present

## 2019-09-17 LAB — CBC
HCT: 36.8 % (ref 36.0–46.0)
Hemoglobin: 12 g/dL (ref 12.0–15.0)
MCH: 33 pg (ref 26.0–34.0)
MCHC: 32.6 g/dL (ref 30.0–36.0)
MCV: 101.1 fL — ABNORMAL HIGH (ref 80.0–100.0)
Platelets: 156 10*3/uL (ref 150–400)
RBC: 3.64 MIL/uL — ABNORMAL LOW (ref 3.87–5.11)
RDW: 13.1 % (ref 11.5–15.5)
WBC: 8.2 10*3/uL (ref 4.0–10.5)
nRBC: 0 % (ref 0.0–0.2)

## 2019-09-17 LAB — LIPID PANEL
Cholesterol: 153 mg/dL (ref 0–200)
HDL: 45 mg/dL (ref >40)
LDL Cholesterol: 88 mg/dL (ref 0–99)
Total CHOL/HDL Ratio: 3.4 RATIO
Triglycerides: 99 mg/dL (ref <150)
VLDL: 20 mg/dL (ref 0–40)

## 2019-09-17 LAB — BASIC METABOLIC PANEL
Anion gap: 10 (ref 5–15)
BUN: 12 mg/dL (ref 8–23)
CO2: 26 mmol/L (ref 22–32)
Calcium: 9 mg/dL (ref 8.9–10.3)
Chloride: 105 mmol/L (ref 98–111)
Creatinine, Ser: 0.82 mg/dL (ref 0.44–1.00)
GFR calc Af Amer: >60 mL/min (ref >60)
GFR calc non Af Amer: >60 mL/min (ref >60)
Glucose, Bld: 93 mg/dL (ref 70–99)
Potassium: 3.5 mmol/L (ref 3.5–5.1)
Sodium: 141 mmol/L (ref 135–145)

## 2019-09-17 MED ORDER — NITROGLYCERIN 0.4 MG SL SUBL: 0.4000 mg | SUBLINGUAL_TABLET | SUBLINGUAL | 2 refills | Status: AC | PRN

## 2019-09-17 MED ORDER — ASPIRIN 81 MG PO TBEC: 81.0000 mg | DELAYED_RELEASE_TABLET | Freq: Every day | ORAL | 3 refills | Status: DC

## 2019-09-17 MED ORDER — AMLODIPINE BESYLATE 10 MG PO TABS: 10.0000 mg | ORAL_TABLET | Freq: Every day | ORAL | 3 refills | Status: DC

## 2019-09-17 NOTE — Discharge Summary (Signed)
Discharge Summary    Patient ID: [name] MRN: [id number]; DOB: 08/02/1941  Admit date: 09/16/2019 Discharge date: 09/17/2019  Primary Care Provider: [name], MD  Primary Cardiologist: Jodelle Red, MD  Primary Electrophysiologist:  None   Discharge Diagnoses    Principal Problem:   NSTEMI (non-ST elevated myocardial infarction) [facility]) Active Problems:   Hx-TIA (transient ischemic attack)   Hyperlipemia   Hypertension   Sleep apnea   History of pulmonary embolus (PE)   Takotsubo cardiomyopathy   Diagnostic Studies/Procedures    Left heart cath 09/16/19: Conclusions: 1. Tortuous coronary arteries with minimal luminal irregularities but no angiographically significant coronary artery disease. 2. Moderately reduced left ventricular systolic function with akinesis of the mid LV segments and relatively normal contraction of the basilar and apical segments.  This most likely represents a variant of stress-induced cardiomyopathy. 3. Mildly elevated left ventricular filling pressure. 4. Incompletely imaged dilated thoracic aorta, as noted on prior CT of the chest.  Recommend continued outpatient follow-up with cross-sectional imaging.  Recommendations: 1. Medical therapy of stress-induced cardiomyopathy.  I think it is reasonable to continue lisinopril 40 mg daily.  Beta blocker is precluded by resting bradycardia. 2. Primary prevention of coronary artery disease. 3. Overnight monitoring with possible discharge home tomorrow. _____________   Echo 09/16/19: 1. Left ventricular ejection fraction, by visual estimation, is 30 to  35%. The left ventricle has moderate to severely decreased function. There  is no left ventricular hypertrophy.  2. Moderately dilated left ventricular internal cavity size.  3. The left ventricle demonstrates regional wall motion abnormalities.  4. Inferior wall hypokinesis septal and apical akinesis.  5. Global right ventricle has  normal systolic function.The right  ventricular size is normal. No increase in right ventricular wall  thickness.  6. Left atrial size was mildly dilated.  7. Right atrial size was mildly dilated.  8. The mitral valve is normal in structure. Mild mitral valve  regurgitation.  9. The tricuspid valve is normal in structure.  10. The tricuspid valve is normal in structure. Tricuspid valve  regurgitation is mild.  11. The aortic valve is tricuspid. Aortic valve regurgitation is mild.  Mild to moderate aortic valve sclerosis/calcification without any evidence  of aortic stenosis.  12. Pulmonic regurgitation is mild.  13. The pulmonic valve was not well visualized. Pulmonic valve  regurgitation is mild.  14. The aortic root was not well visualized.  15. Mildly elevated pulmonary artery systolic pressure.  16. The interatrial septum was not well visualized.    History of Present Illness     [name] is a 79 y.o. female with hypertension, asthma, scoliosis and chronic back pain, hyperlipidemia, PE/DVT (in 2018 previously on Eliquis), remote tobacco use, AAA (4.2 cm) who presented to [facility] with chest pain.   [name] not been seen by [facility] in the past. Past medical history as above. Denies history of MI or stent. In 09/13/2016 patient was admitted to the hospital with LLE DVT and bilateral PE with right heart strain. Echo during admission showed EF of 60 to 65%, moderate LVH, normal wall motion, grade 1 diastolic dysfunction, mild AR, mildly dilated LA. CT of the chest showed incidental 4.5 ascending aortic aneurysm for which she began to follow with Dr. Laneta Simmers. Last visit was 08/26/2018 where follow-up CTA of the chest showed stable aneurysm of 4.2 cm. Patient is not a good surgical candidate given age and comorbidities. Dr. Laneta Simmers did not think further CT images were indicated given small  size of aneurysm at her age.   The patient presented to the ED 09/16/2019 for chest pain. The  patient noticed the pain after waking up at 6 AM. She was sitting in her bed when she felt a sudden onset left-sided pain. It was pressure like, 10/10. Pain radiated down her left arm. No associated sob, nausea, vomiting, or diaphoresis. She called her son who arrived about 30 minutes later. By that time the pain had eased off a little. Nothing made the pain worse or better. She has never felt this pain before. Patient does take Lasix as needed for swelling (she is not sure how many she takes since her son manages her medications). She denies worsening lower leg edema and orthopnea.  In the ED blood pressure elevated with systolics 170s to 180s.  Heart rate 40s to 50s, afebrile, Oxygen 96%. Labs showed potassium 3.6, glucose 123, creatinine 0.93.  LFTs within normal limits. WBC 6.8, hemoglobin 12.9. HS troponin 53.  Covid pending.  Chest x-ray negative for acute process.  EKG shows sinus bradycardia with possible first AV degree block, no ST T wave abnormality. Second troponin came back at 533 and cardiology was consulted for admission.  Patient quit smoking over 20 years ago. No alcohol or drug use. She lives with her granddaughter. Patient is able to care for herself with some help from her family.   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 Cristal Deer who appreciated wall motion abnormality with inferior wall hypokinesis, septal, and apical akinesis. Given her clinical picture, echo, risk factors, and rising troponin, she was taken to the cath lab on 09/16/19. Left heart cath revealed nonobstructive disease, but evidence of akinesis of the mid LV segments and relatively normal contraction of the basilar and apical segments. Dr. Okey Dupre felt this represented a variant of stress cardiomyopathy and recommended medical therapy.   EF is 30-35%.   Unfortunately,  she has been bradycardic in the 40-50s and unable to tolerate a BB.   We will continue her home PRN lasix regimen of 40 mg daily with 20 mEq potassium as needed for 3 lb weight gain in 1 day or 5 lb weight gain in 1 week.   Anticipate repeat echo in 3 months.    Hypertension Continue home lisinopril, but holding HCTZ. Started 10 mg amlodipine.  Pressures have been stable.    Hyperlipidemia Home lipitor 20 mg 09/17/2019: Cholesterol 153; HDL 45; LDL Cholesterol 88; Triglycerides 99; VLDL 20 Since left heat cath without angiographically significantly disease. Will continue home dose.    Ascending aortic aneurysm Previously followed by Dr. Laneta Simmers. Stable on CT 08/26/18, with no further imaging recommended.    Hyperthyroidism TSH this admission was low at 0.113 Needs follow up with PCP.    Cardiology follow up has been made. Pt seen and examined by Dr. Cristal Deer and deemed stable for discharge.    Did the patient have an acute coronary syndrome (MI, NSTEMI, STEMI, etc) this admission?:  Yes                               AHA/ACC Clinical Performance & Quality Measures: 1. Aspirin prescribed? - Yes 2. ADP Receptor Inhibitor (Plavix/Clopidogrel, Brilinta/Ticagrelor or Effient/Prasugrel) prescribed (includes medically managed patients)? - No - no CAD 3. Beta Blocker prescribed? - No - braydcardia 4.  High Intensity Statin (Lipitor 40-80mg  or Crestor 20-40mg ) prescribed? - No - no CAD 5. EF assessed during THIS hospitalization? - Yes 6. For EF <40%, was ACEI/ARB prescribed? - Yes 7. For EF <40%, Aldosterone Antagonist (Spironolactone or Eplerenone) prescribed? - No - Reason:   pressure 8. Cardiac Rehab Phase II ordered (Included Medically managed Patients)? - Yes   _____________  Discharge Vitals Blood pressure (!) 146/70, pulse 61, temperature 97.8 F (36.6 C), temperature source Oral, resp. rate 14, height  (1.626 m), weight 88.9 kg, SpO2 94 %.  Filed Weights   09/16/19  0742 09/16/19 1409 09/17/19 0329  Weight: 86.2 kg 87.4 kg 88.9 kg   Physical Exam  Constitutional: She is oriented to person, place, and time. She appears well-developed and well-nourished.  Eyes: Pupils are equal, round, and reactive to light.  Neck: No JVD present.  Cardiovascular: Normal rate and regular rhythm.  Pulmonary/Chest: Effort normal and breath sounds normal.  Abdominal: Soft. Bowel sounds are normal.  Musculoskeletal:        General: No edema.  Neurological: She is alert and oriented to person, place, and time.  Skin: Skin is warm and dry.  Psychiatric: She has a normal mood and affect.   Right radial site C/D/I    Labs & Radiologic Studies    CBC Recent Labs    09/16/19 0750 09/17/19 0242  WBC 6.8 8.2  HGB 12.9 12.0  HCT 40.4 36.8  MCV 103.3* 101.1*  PLT 176 156   Basic Metabolic Panel Recent Labs    16/10/96 0750 09/16/19 0952 09/17/19 0242  NA 142  --  141  K 3.6  --  3.5  CL 101  --  105  CO2 31  --  26  GLUCOSE 123*  --  93  BUN 13  --  12  CREATININE 0.93  --  0.82  CALCIUM 9.3  --  9.0  MG  --  2.0  --    Liver Function Tests Recent Labs    09/16/19 0750  AST 21  ALT 17  ALKPHOS 46  BILITOT 1.0  PROT 7.0  ALBUMIN 3.6   No results for input(s): LIPASE, AMYLASE in the last 72 hours. High Sensitivity Troponin:   Recent Labs  Lab 09/16/19 0750 09/16/19 0952  TROPONINIHS 53* 553*    BNP Invalid input(s): POCBNP D-Dimer No results for input(s): DDIMER in the last 72 hours. Hemoglobin A1C Recent Labs    09/16/19 0750  HGBA1C 5.6   Fasting Lipid Panel Recent Labs    09/17/19 0242  CHOL 153  HDL 45  LDLCALC 88  TRIG 99  CHOLHDL 3.4   Thyroid Function Tests Recent Labs    09/16/19 1849  TSH 0.113*   _____________  CARDIAC CATHETERIZATION  Result Date: 09/16/2019 Conclusions: 1. Tortuous coronary arteries with minimal luminal irregularities but no angiographically significant coronary artery disease. 2.  Moderately reduced left ventricular systolic function with akinesis of the mid LV segments and relatively normal contraction of the basilar and apical segments.  This most likely represents a variant of stress-induced cardiomyopathy. 3. Mildly elevated left ventricular filling pressure. 4. Incompletely imaged dilated thoracic aorta, as noted on prior CT of the chest.  Recommend continued outpatient follow-up with cross-sectional imaging. Recommendations: 1. Medical therapy of stress-induced cardiomyopathy.  I think it is reasonable to continue lisinopril 40 mg daily.  Beta blocker is precluded by resting bradycardia. 2. Primary prevention of coronary artery disease. 3. Overnight monitoring with possible discharge home  tomorrow. Yvonne Kendall, MD [facility]   DG Chest Port 1 View  Result Date: 09/16/2019 CLINICAL DATA:  Chest pain EXAM: PORTABLE CHEST 1 VIEW COMPARISON:  09/28/2016 FINDINGS: There is mild bilateral interstitial thickening. There is no focal consolidation. There is no pleural effusion or pneumothorax. The heart and mediastinal contours are unremarkable. 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 IMPRESSION: No active disease. Electronically Signed   By: Elige Ko   On: 09/16/2019 08:34   ECHOCARDIOGRAM COMPLETE  Result Date: 09/16/2019   ECHOCARDIOGRAM REPORT   Patient Name:   [name] [place]wood Date of Exam: 09/16/2019 Medical Rec #:  [id number]        Height:       62.0 in Accession #:    [id number]       Weight:       190.0 lb Date of Birth:  11/20/1940       BSA:          1.87 m Patient Age:    78 years         BP:           168/80 mmHg Patient Gender: F                HR:           48 bpm. Exam Location:  Inpatient Procedure: 2D Echo Indications:    chest pain786.50  History:        Patient has prior history of Echocardiogram examinations, most                 recent 09/29/2016. Risk Factors:Hypertension, Dyslipidemia and                 Sleep Apnea.   Sonographer:    Delcie Roch Referring Phys: [id number] CADENCE H FURTH IMPRESSIONS  1. Left ventricular ejection fraction, by visual estimation, is 30 to 35%. The left ventricle has moderate to severely decreased function. There is no left ventricular hypertrophy.  2. Moderately dilated left ventricular internal cavity size.  3. The left ventricle demonstrates regional wall motion abnormalities.  4. Inferior wall hypokinesis septal and apical akinesis.  5. Global right ventricle has normal systolic function.The right ventricular size is normal. No increase in right ventricular wall thickness.  6. Left atrial size was mildly dilated.  7. Right atrial size was mildly dilated.  8. The mitral valve is normal in structure. Mild mitral valve regurgitation.  9. The tricuspid valve is normal in structure. 10. The tricuspid valve is normal in structure. Tricuspid valve regurgitation is mild. 11. The aortic valve is tricuspid. Aortic valve regurgitation is mild. Mild to moderate aortic valve sclerosis/calcification without any evidence of aortic stenosis. 12. Pulmonic regurgitation is mild. 13. The pulmonic valve was not well visualized. Pulmonic valve regurgitation is mild. 14. The aortic root was not well visualized. 15. Mildly elevated pulmonary artery systolic pressure. 16. The interatrial septum was not well visualized. FINDINGS  Left Ventricle: Left ventricular ejection fraction, by visual estimation, is 30 to 35%. The left ventricle has moderate to severely decreased function. The left ventricle demonstrates regional wall motion abnormalities. The left ventricular internal cavity size was moderately dilated left ventricle. There is no left ventricular hypertrophy. Inferior wall hypokinesis septal and apical akinesis. Right Ventricle: The right ventricular size is normal. No increase in right ventricular wall thickness. Global RV systolic function is has normal systolic function. The tricuspid regurgitant velocity is  2.62 m/s, and with an assumed right  atrial pressure  of 3 mmHg, the estimated right ventricular systolic pressure is mildly elevated at 30.5 mmHg. Left Atrium: Left atrial size was mildly dilated. Right Atrium: Right atrial size was mildly dilated Pericardium: There is no evidence of pericardial effusion. Mitral Valve: The mitral valve is normal in structure. There is mild thickening of the mitral valve leaflet(s). Mild mitral valve regurgitation. Tricuspid Valve: The tricuspid valve is normal in structure. Tricuspid valve regurgitation is mild. Aortic Valve: The aortic valve is tricuspid. Aortic valve regurgitation is mild. Aortic regurgitation PHT measures 761 msec. Mild to moderate aortic valve sclerosis/calcification is present, without any evidence of aortic stenosis. Pulmonic Valve: The pulmonic valve was not well visualized. Pulmonic valve regurgitation is mild. Pulmonic regurgitation is mild. Aorta: The aortic root was not well visualized. IAS/Shunts: The interatrial septum was not well visualized.  LEFT VENTRICLE PLAX 2D LVIDd:         5.10 cm       Diastology LVIDs:         3.50 cm       LV e' lateral:   7.07 cm/s LV PW:         0.90 cm       LV E/e' lateral: 8.2 LV IVS:        1.10 cm       LV e' medial:    5.11 cm/s LVOT diam:     1.90 cm       LV E/e' medial:  11.4 LV SV:         73 ml LV SV Index:   36.82 LVOT Area:     2.84 cm  LV Volumes (MOD) LV area d, A2C:    27.80 cm LV area d, A4C:    29.00 cm LV area s, A2C:    20.50 cm LV area s, A4C:    21.30 cm LV major d, A2C:   8.40 cm LV major d, A4C:   8.36 cm LV major s, A2C:   7.60 cm LV major s, A4C:   7.41 cm LV vol d, MOD A2C: 76.0 ml LV vol d, MOD A4C: 82.4 ml LV vol s, MOD A2C: 46.8 ml LV vol s, MOD A4C: 49.7 ml LV SV MOD A2C:     29.2 ml LV SV MOD A4C:     82.4 ml LV SV MOD BP:      30.9 ml RIGHT VENTRICLE RV S prime:     10.60 cm/s TAPSE (M-mode): 2.5 cm LEFT ATRIUM             Index       RIGHT ATRIUM           Index LA diam:        3.90  cm 2.08 cm/m  RA Area:     17.00 cm LA Vol (A2C):   40.9 ml 21.86 ml/m RA Volume:   38.40 ml  20.53 ml/m LA Vol (A4C):   61.9 ml 33.09 ml/m LA Biplane Vol: 53.4 ml 28.55 ml/m  AORTIC VALVE LVOT Vmax:   97.90 cm/s LVOT Vmean:  64.600 cm/s LVOT VTI:    0.264 m AI PHT:      761 msec  AORTA Ao Root diam: 3.40 cm Ao Asc diam:  2.90 cm MITRAL VALVE                        TRICUSPID VALVE MV Area (PHT): 3.12 cm  TR Peak grad:   27.5 mmHg MV PHT:        70.47 msec           TR Vmax:        262.00 cm/s MV Decel Time: 243 msec MV E velocity: 58.30 cm/s 103 cm/s  SHUNTS MV A velocity: 77.60 cm/s 70.3 cm/s Systemic VTI:  0.26 m MV E/A ratio:  0.75       1.5       Systemic Diam: 1.90 cm  Charlton Haws MD Electronically signed by Charlton Haws MD Signature Date/Time: 09/16/2019/1:16:02 PM    Final    Disposition   Pt is being discharged home today in good condition.  Follow-up Plans & Appointments    Follow-up Information    Jodelle Red, MD Follow up on 09/24/2019.   Specialty: Cardiology Why: 10:20 am for hospital follow up Contact information: [address] [place] [address] [phone]          Discharge Instructions    Amb Referral to Cardiac Rehabilitation   Complete by: As directed    Diagnosis: NSTEMI   After initial evaluation and assessments completed: Virtual Based Care may be provided alone or in conjunction with Phase 2 Cardiac Rehab based on patient barriers.: Yes   Diet - low sodium heart healthy   Complete by: As directed    Discharge instructions   Complete by: As directed    No driving for 2 days. No lifting over 5 lbs for 1 week. No sexual activity for 1 week. Keep procedure site clean & dry. If you notice increased pain, swelling, bleeding or pus, call/return!  You may shower, but no soaking baths/hot tubs/pools for 1 week.   Increase activity slowly   Complete by: As directed       Discharge Medications   Allergies as of 09/17/2019        Reactions   Codeine Itching      Medication List    STOP taking these medications   hydrochlorothiazide 25 MG tablet Commonly known as: HYDRODIURIL     TAKE these medications   amLODipine 10 MG tablet Commonly known as: NORVASC Take 1 tablet (10 mg total) by mouth daily. Start taking on: September 18, 2019   aspirin 81 MG EC tablet Take 1 tablet (81 mg total) by mouth daily. Start taking on: September 18, 2019 What changed:   medication strength  how much to take   atorvastatin 20 MG tablet Commonly known as: LIPITOR Take 20 mg by mouth every evening.   busPIRone 30 MG tablet Commonly known as: BUSPAR Take 1 tablet (30 mg total) by mouth 2 (two) times daily. What changed: how much to take   cholecalciferol 25 MCG (1000 UNIT) tablet Commonly known as: VITAMIN D3 Take 1,000 Units by mouth daily.   donepezil 10 MG tablet Commonly known as: ARICEPT Take 10 mg by mouth at bedtime.   furosemide 40 MG tablet Commonly known as: LASIX Take 1 tablet (40 mg total) by mouth daily as needed for edema.   Linzess 145 MCG Caps capsule Generic drug: linaclotide Take 145 mcg by mouth daily as needed for constipation.   lisinopril 40 MG tablet Commonly known as: ZESTRIL Take 40 mg by mouth daily.   memantine 28 MG Cp24 24 hr capsule Commonly known as: NAMENDA XR Take 28 mg by mouth daily.   mirtazapine 30 MG tablet Commonly known as: REMERON Take 0.5-1 tablets (15-30 mg total) by mouth at  bedtime. What changed:   how much to take  when to take this  reasons to take this   MULTIVITAMIN GUMMIES ADULT PO Take 1 each by mouth daily.   multivitamin with minerals Tabs tablet Take 1 tablet by mouth daily.   nitroGLYCERIN 0.4 MG SL tablet Commonly known as: NITROSTAT Place 1 tablet (0.4 mg total) under the tongue every 5 (five) minutes as needed for chest pain.   oxyCODONE 15 MG immediate release tablet Commonly known as: ROXICODONE Take 7.5 mg by mouth 4  (four) times daily as needed for pain.   potassium chloride 10 MEQ tablet Commonly known as: KLOR-CON Take 10 mEq by mouth daily as needed (to take with furosemide).   Restasis 0.05 % ophthalmic emulsion Generic drug: cycloSPORINE Place 1 drop into both eyes daily as needed (dry eyes).   Zinc 50 MG Tabs Take 25 mg by mouth daily.          Outstanding Labs/Studies   Echo in three months  Duration of Discharge Encounter   Greater than 30 minutes including physician time.  Signed, Roe Rutherford Toshio Slusher, PA 09/17/2019, 12:40 PM

## 2019-09-17 NOTE — Progress Notes (Signed)
CARDIAC REHAB PHASE I   PRE:  Rate/Rhythm: 55 sB    BP: sitting 111/57    SaO2: 95 RA  MODE:  Ambulation: 210 ft   POST:  Rate/Rhythm: 79 SR    BP: sitting 122/81     SaO2: 97 RA  Pt able to move to EOB and stand slowly. Very unsteady walking to sink with her cane therefore changed to RW. More steady with RW and able to walk in hall with gait belt min assist. To recliner after walk. Tired but feels well. VSS. She has a RW and cane at home and only walks in house. She has help from her granddaughter in the shower, etc. Her son cooks for her. Discussed MI/takotsubo, HF, daily wts, low sodium diet, and CRPII. Did not give exercise as she is unable to walk much. She is interested in CRPII potentially and will have them call her for in person program. [phone]   Harriet Masson CES, ACSM 09/17/2019 11:38 AM

## 2019-09-21 ENCOUNTER — Encounter (HOSPITAL_COMMUNITY): Payer: Self-pay | Admitting: *Deleted

## 2019-09-21 NOTE — Progress Notes (Signed)
Referral received and verified for MD signature.  Follow up appt is 10/01/19.  Due to the senior leadership directed instructions to pause onsite cardiac rehab in response to the surge of Covid + patients within the health system staff were deployed to support health system and community needs.  Will wait to check pt insurance benefits and eligibility once onsite cardiac rehab is permitted to reopen. Pt will be contacted for scheduling [facility] upon satisfactorily completing their follow up appt on 10/01/19.  Alanson Aly, BSN Cardiac and Emergency planning/management officer

## 2019-09-23 ENCOUNTER — Telehealth: Payer: Self-pay | Admitting: Cardiology

## 2019-09-23 NOTE — Telephone Encounter (Signed)
[name] called asking if his mother should have COVID vaccine - after review of the chart I stated yes she should.  Her PCP was in agreement as well per her son.

## 2019-09-23 NOTE — Telephone Encounter (Signed)
Son of the patient wanted to know if Dr. Cristal Deer thinks the patient is safe to get a COVID shot. She has an appointment at her Pharmacy to get one tomorrow, but he just wants to make sure first. Please advise

## 2019-09-24 ENCOUNTER — Ambulatory Visit: Payer: Medicare Other | Admitting: Cardiology

## 2019-09-28 ENCOUNTER — Telehealth: Payer: Self-pay | Admitting: Cardiology

## 2019-09-28 NOTE — Telephone Encounter (Signed)
Son advised that he is ok to attend appointment.

## 2019-09-28 NOTE — Telephone Encounter (Signed)
New Message   [name] said he needs to be with the pt on at her appointment on 10/01/19. He said that he is her POA and wanted to be present on all of her appointments.  Please advise

## 2019-09-30 NOTE — Telephone Encounter (Signed)
Pt has an appointment scheduled with MD tomorrow 2/19.

## 2019-10-01 ENCOUNTER — Ambulatory Visit: Payer: Medicare Other | Admitting: Cardiology

## 2019-10-04 ENCOUNTER — Telehealth (HOSPITAL_COMMUNITY): Payer: Self-pay

## 2019-10-04 ENCOUNTER — Ambulatory Visit: Payer: Medicare Other | Admitting: Physician Assistant

## 2019-10-04 NOTE — Telephone Encounter (Signed)
Pt insurance is active and benefits verified through Medicare a/b Co-pay 0, DED $203/$203 met, out of pocket 0/0 met, co-insurance 20%. no pre-authorization required. Passport, 10/04/2019'@10' :48am, REF# [id number]  Will contact patient to see if she is interested in the Cardiac Rehab Program. If interested, patient will need to complete follow up appt. Once completed, patient will be contacted for scheduling upon review by the RN Navigator.

## 2019-10-13 ENCOUNTER — Ambulatory Visit (INDEPENDENT_AMBULATORY_CARE_PROVIDER_SITE_OTHER): Payer: Medicare Other | Admitting: Cardiology

## 2019-10-13 ENCOUNTER — Other Ambulatory Visit: Payer: Self-pay

## 2019-10-13 ENCOUNTER — Encounter: Payer: Self-pay | Admitting: Cardiology

## 2019-10-13 VITALS — BP 109/65 | HR 80 | Temp 97.0°F | Ht 63.0 in | Wt 194.4 lb

## 2019-10-13 DIAGNOSIS — Z09 Encounter for follow-up examination after completed treatment for conditions other than malignant neoplasm: Secondary | ICD-10-CM

## 2019-10-13 DIAGNOSIS — Z7189 Other specified counseling: Secondary | ICD-10-CM

## 2019-10-13 DIAGNOSIS — I428 Other cardiomyopathies: Secondary | ICD-10-CM

## 2019-10-13 DIAGNOSIS — I5181 Takotsubo syndrome: Secondary | ICD-10-CM | POA: Diagnosis not present

## 2019-10-13 DIAGNOSIS — E7800 Pure hypercholesterolemia, unspecified: Secondary | ICD-10-CM

## 2019-10-13 DIAGNOSIS — I10 Essential (primary) hypertension: Secondary | ICD-10-CM

## 2019-10-13 NOTE — Progress Notes (Signed)
Cardiology Office Note:    Date:  10/13/2019   ID:  DEVINN VOSHELL, DOB 1941/05/08, MRN [id number]  PCP:  [name], MD  Cardiologist:  Jodelle Red, MD  Referring MD: [name], MD   CC: post discharge follow up  History of Present Illness:    [name] is a 79 y.o. female with a hx of hypertension, asrthma, hyperlipidemia, prior DVT/PE who is seen for post hospital follow up. She was scheduled for follow up on 09/24/19 for [facility] visit but patient rescheduled appt.  She was discharged on 09/17/19 after admission for chest pain, elevated troponins consistent with NSTEMI. Cath showed nonobstructive CAD. Echo showed cardiomyopathy with EF 30-35%. 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 Unable to use beta blocker due to resting bradycardia, and she was already on an ACEi. She was discharged with PRN lasix for volume overload. Plan for repeat echo 12/2019.  Today: Discharge medications reviewed from discharge summary. Extensive medication reconciliation done today. Her son is here today, and he takes care of all her medications.   Has had one episode of chest pain since discharge, went away with one nitroglycerin. Rare LE edema, goes away with lasix/potassium x1. Otherwise no issues.   Denies shortness of breath at rest or with normal exertion. No PND, orthopnea, or unexpected weight gain. No syncope or palpitations.  Past Medical History:  Diagnosis Date  . Arthritis   . Asthma   . Chronic back pain   . Depression   . History of pulmonary embolus (PE)   . Hx-TIA (transient ischemic attack)   . Hyperlipemia   . Hypertension   . Personal history of DVT (deep vein thrombosis)   . Scoliosis   . Sleep apnea    cpap - settingsi at 2   . 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 ARTHROSCOPIC REPAIR ACL  02/03/2004  . LEFT HEART CATH AND CORONARY  ANGIOGRAPHY N/A 09/16/2019   Procedure: LEFT HEART CATH AND CORONARY ANGIOGRAPHY;  Surgeon: Yvonne Kendall, MD;  Location: [facility] INV[facility];  Service: Cardiovascular;  Laterality: N/A;  . OOPHORECTOMY  1970  . SPINE SURGERY  03/31/2006  . TOTAL KNEE ARTHROPLASTY Left 05/17/2016   Procedure: LEFT TOTAL KNEE ARTHROPLASTY;  Surgeon: Kathryne Hitch, MD;  Location: [facility] OR[facility];  Service: Orthopedics;  Laterality: Left;  Adductor Block; Spinal to General  . TUBAL LIGATION  1983    Current Medications: Current Outpatient Medications on File Prior to Visit  Medication Sig  . aspirin EC 81 MG EC tablet Take 1 tablet (81 mg total) by mouth daily.  . cholecalciferol (VITAMIN D3) 25 MCG (1000 UT) tablet Take 1,000 Units by mouth daily.  Marland Kitchen donepezil (ARICEPT) 10 MG tablet Take 10 mg by mouth at bedtime.   . furosemide (LASIX) 40 MG tablet Take 1 tablet (40 mg total) by mouth daily as needed for edema.  Marland Kitchen LINZESS 145 MCG CAPS capsule Take 145 mcg by mouth daily as needed for constipation.  Marland Kitchen lisinopril (PRINIVIL,ZESTRIL) 40 MG tablet Take 40 mg by mouth daily.   . memantine (NAMENDA XR) 28 MG CP24 24 hr capsule Take 28 mg by mouth daily.   . mirtazapine (REMERON) 30 MG tablet Take 0.5-1 tablets (15-30 mg total) by mouth at bedtime. (Patient taking differently: Take 7.5 mg by mouth at bedtime as needed (sleep). )  . Multiple Vitamin (MULTIVITAMIN WITH MINERALS) TABS tablet  Take 1 tablet by mouth daily.   . Multiple Vitamins-Minerals (MULTIVITAMIN GUMMIES ADULT PO) Take 1 each by mouth daily.  . nitroGLYCERIN (NITROSTAT) 0.4 MG SL tablet Place 1 tablet (0.4 mg total) under the tongue every 5 (five) minutes as needed for chest pain.  Marland Kitchen oxyCODONE (ROXICODONE) 15 MG immediate release tablet Take 7.5 mg by mouth 4 (four) times daily as needed for pain.   . potassium chloride (K-DUR) 10 MEQ tablet Take 10 mEq by mouth daily as needed (to take with furosemide).   . RESTASIS 0.05 % ophthalmic emulsion Place 1  drop into both eyes daily as needed (dry eyes).   . Zinc 50 MG TABS Take 25 mg by mouth daily.    No current facility-administered medications on file prior to visit.     Allergies:   Codeine   Social History   Tobacco Use  . Smoking status: Former Smoker    Packs/day: 1.00    Years: 10.00    Pack years: 10.00    Types: Cigarettes    Quit date: 08/12/1973    Years since quitting: 46.2  . Smokeless tobacco: Never Used  Substance Use Topics  . Alcohol use: No  . Drug use: No    Family History: family history includes Other in her father.  ROS:   Please see the history of present illness.  Additional pertinent ROS: Constitutional: Negative for chills, fever, night sweats, unintentional weight loss  HENT: Negative for ear pain and hearing loss.   Eyes: Negative for loss of vision and eye pain.  Respiratory: Negative for cough, sputum, wheezing.   Cardiovascular: See HPI. Gastrointestinal: Negative for abdominal pain, melena, and hematochezia.  Genitourinary: Negative for dysuria and hematuria.  Musculoskeletal: Negative for falls and myalgias.  Skin: Negative for itching and rash.  Neurological: Negative for focal weakness, focal sensory changes and loss of consciousness.  Endo/Heme/Allergies: Does not bruise/bleed easily.     EKGs/Labs/Other Studies Reviewed:    The following studies were reviewed today: Cath 09/16/19 Conclusions: 1. Tortuous coronary arteries with minimal luminal irregularities but no angiographically significant coronary artery disease. 2. Moderately reduced left ventricular systolic function with akinesis of the mid LV segments and relatively normal contraction of the basilar and apical segments.  This most likely represents a variant of stress-induced cardiomyopathy. 3. Mildly elevated left ventricular filling pressure. 4. Incompletely imaged dilated thoracic aorta, as noted on prior CT of the chest.  Recommend continued outpatient follow-up with  cross-sectional imaging.  Recommendations: 1. Medical therapy of stress-induced cardiomyopathy.  I think it is reasonable to continue lisinopril 40 mg daily.  Beta blocker is precluded by resting bradycardia. 2. Primary prevention of coronary artery disease. 3. Overnight monitoring with possible discharge home tomorrow.  Echo 09/16/19 1. Left ventricular ejection fraction, by visual estimation, is 30 to  35%. The left ventricle has moderate to severely decreased function. There  is no left ventricular hypertrophy.  2. Moderately dilated left ventricular internal cavity size.  3. The left ventricle demonstrates regional wall motion abnormalities.  4. Inferior wall hypokinesis septal and apical akinesis.  5. Global right ventricle has normal systolic function.The right  ventricular size is normal. No increase in right ventricular wall  thickness.  6. Left atrial size was mildly dilated.  7. Right atrial size was mildly dilated.  8. The mitral valve is normal in structure. Mild mitral valve  regurgitation.  9. The tricuspid valve is normal in structure.  10. The tricuspid valve is normal in structure.  Tricuspid valve  regurgitation is mild.  11. The aortic valve is tricuspid. Aortic valve regurgitation is mild.  Mild to moderate aortic valve sclerosis/calcification without any evidence  of aortic stenosis.  12. Pulmonic regurgitation is mild.  13. The pulmonic valve was not well visualized. Pulmonic valve  regurgitation is mild.  14. The aortic root was not well visualized.  15. Mildly elevated pulmonary artery systolic pressure.  16. The interatrial septum was not well visualized.   EKG:  EKG is personally reviewed.  The ekg ordered 09/17/19 demonstrates sinus bradycardia, first degree AV block, diffuse T wave inversions, prolonged QT  Recent Labs: 09/16/2019: ALT 17; B Natriuretic Peptide 91.2; Magnesium 2.0; TSH 0.113 09/17/2019: BUN 12; Creatinine, Ser 0.82; Hemoglobin 12.0;  Platelets 156; Potassium 3.5; Sodium 141  Recent Lipid Panel    Component Value Date/Time   CHOL 153 09/17/2019 0242   TRIG 99 09/17/2019 0242   HDL 45 09/17/2019 0242   CHOLHDL 3.4 09/17/2019 0242   VLDL 20 09/17/2019 0242   LDLCALC 88 09/17/2019 0242    Physical Exam:    VS:  BP 109/65   Pulse 80   Temp (!) 97 F (36.1 C)   Ht 5\' 3"  (1.6 m)   Wt 194 lb 6.4 oz (88.2 kg)   SpO2 95%   BMI 34.44 kg/m     Wt Readings from Last 3 Encounters:  10/13/19 194 lb 6.4 oz (88.2 kg)  09/17/19 195 lb 15.8 oz (88.9 kg)  08/26/18 190 lb (86.2 kg)    GEN: Well nourished, well developed in no acute distress HEENT: Normal, moist mucous membranes NECK: No JVD CARDIAC: regular rhythm, normal S1 and S2, no rubs or gallops. No murmurs. VASCULAR: Radial and DP pulses 2+ bilaterally. No carotid bruits RESPIRATORY:  Clear to auscultation without rales, wheezing or rhonchi  ABDOMEN: Soft, non-tender, non-distended MUSCULOSKELETAL:  Ambulates independently SKIN: Warm and dry, no edema NEUROLOGIC:  Alert and oriented x 3. No focal neuro deficits noted. PSYCHIATRIC:  Normal affect    ASSESSMENT:    1. Hospital discharge follow-up   2. Nonischemic cardiomyopathy (HCC)   3. Takotsubo cardiomyopathy   4. Essential hypertension   5. Cardiac risk counseling   6. Counseling on health promotion and disease prevention   7. Pure hypercholesterolemia    PLAN:    Recent hospitalization with NSTEMI, found to have new systolic heart failure: seen for hospital discharge follow up -cath without CAD, felt to be a takotsubo nonischemic cardiomyopathy -bradycardia precludes beta blocker. HR improved today but was in the 40s-50s during her admission on no agents -on lisinopril for reduced EF -repeat echo 12/2019 -has PRN furosemide for weight gain, has been stable. Has potassium for when she takes lasix  Hypertension: -continue amlodipine 10 mg daily, lisinopril 40 mg  daily  Hypercholesterolemia: -continue atorvastatin 20 mg daily -without CAD, goal LDL <100 -we did discuss dropping the aspirin as she does not have CAD. Will consider, re-discuss at follow up  Cardiac risk counseling and prevention recommendations: -recommend heart healthy/Mediterranean diet, with whole grains, fruits, vegetable, fish, lean meats, nuts, and olive oil. Limit salt. -recommend moderate walking, 3-5 times/week for 30-50 minutes each session. Aim for at least 150 minutes.week. Goal should be pace of 3 miles/hours, or walking 1.5 miles in 30 minutes -recommend avoidance of tobacco products. Avoid excess alcohol.  Plan for follow up: 2 mos, echo prior  01/2020, MD, PhD [place]  [facility]    Medication Adjustments/Labs and Tests  Ordered: Current medicines are reviewed at length with the patient today.  Concerns regarding medicines are outlined above.  Orders Placed This Encounter  Procedures  . ECHOCARDIOGRAM COMPLETE   No orders of the defined types were placed in this encounter.   Patient Instructions  Medication Instructions:  Your Physician recommend you continue on your current medication as directed.    *If you need a refill on your cardiac medications before your next appointment, please call your pharmacy*   Lab Work: None   Testing/Procedures: Your physician has requested that you have an echocardiogram in May 2021. Echocardiography is a painless test that uses sound waves to create images of your heart. It provides your doctor with information about the size and shape of your heart and how well your heart's chambers and valves are working. This procedure takes approximately one hour. There are no restrictions for this procedure. [address]    Follow-Up: At [facility], you and your health needs are our priority.  As part of our continuing mission to provide you with exceptional heart care, we have created  designated Provider Care Teams.  These Care Teams include your primary Cardiologist (physician) and Advanced Practice Providers (APPs -  Physician Assistants and Nurse Practitioners) who all work together to provide you with the care you need, when you need it.  We recommend signing up for the patient portal called "MyChart".  Sign up information is provided on this After Visit Summary.  MyChart is used to connect with patients for Virtual Visits (Telemedicine).  Patients are able to view lab/test results, encounter notes, upcoming appointments, etc.  Non-urgent messages can be sent to your provider as well.   To learn more about what you can do with MyChart, go to [email or web address].    Your next appointment:   2 month(s) after ECHO  The format for your next appointment:   In Person  Provider:   Buford Dresser, MD       Signed, Sarah Dresser, MD PhD 10/13/2019 12:19 PM    [place]

## 2019-10-13 NOTE — Patient Instructions (Signed)
Medication Instructions:  Your Physician recommend you continue on your current medication as directed.    *If you need a refill on your cardiac medications before your next appointment, please call your pharmacy*   Lab Work: None   Testing/Procedures: Your physician has requested that you have an echocardiogram in May 2021. Echocardiography is a painless test that uses sound waves to create images of your heart. It provides your doctor with information about the size and shape of your heart and how well your heart's chambers and valves are working. This procedure takes approximately one hour. There are no restrictions for this procedure. [address]    Follow-Up: At [facility], you and your health needs are our priority.  As part of our continuing mission to provide you with exceptional heart care, we have created designated Provider Care Teams.  These Care Teams include your primary Cardiologist (physician) and Advanced Practice Providers (APPs -  Physician Assistants and Nurse Practitioners) who all work together to provide you with the care you need, when you need it.  We recommend signing up for the patient portal called "MyChart".  Sign up information is provided on this After Visit Summary.  MyChart is used to connect with patients for Virtual Visits (Telemedicine).  Patients are able to view lab/test results, encounter notes, upcoming appointments, etc.  Non-urgent messages can be sent to your provider as well.   To learn more about what you can do with MyChart, go to [email or web address].    Your next appointment:   2 month(s) after ECHO  The format for your next appointment:   In Person  Provider:   Jodelle Red, MD

## 2019-10-15 ENCOUNTER — Encounter: Payer: Self-pay | Admitting: Physician Assistant

## 2019-10-15 ENCOUNTER — Other Ambulatory Visit: Payer: Self-pay

## 2019-10-15 ENCOUNTER — Ambulatory Visit (INDEPENDENT_AMBULATORY_CARE_PROVIDER_SITE_OTHER): Payer: Medicare Other | Admitting: Physician Assistant

## 2019-10-15 DIAGNOSIS — R413 Other amnesia: Secondary | ICD-10-CM | POA: Diagnosis not present

## 2019-10-15 DIAGNOSIS — F411 Generalized anxiety disorder: Secondary | ICD-10-CM

## 2019-10-15 DIAGNOSIS — G4700 Insomnia, unspecified: Secondary | ICD-10-CM

## 2019-10-15 DIAGNOSIS — F331 Major depressive disorder, recurrent, moderate: Secondary | ICD-10-CM | POA: Diagnosis not present

## 2019-10-15 MED ORDER — BUSPIRONE HCL 30 MG PO TABS: 30.0000 mg | ORAL_TABLET | Freq: Every day | ORAL | 0 refills | Status: DC

## 2019-10-15 NOTE — Progress Notes (Addendum)
[facility] Med Check  Patient ID: [name],  MRN: [id number]  PCP: [name], MD  Date of Evaluation: 10/15/2019 Time spent:20 minutes  Chief Complaint:  Chief Complaint    Anxiety; Depression; Memory Loss; Follow-up      HISTORY/CURRENT STATUS: HPI For routine med check.  Accompanied by her son Harvie Heck.  Doing really well. Feels good on her meds.  States she sleeps well.  Harvie Heck is cutting the mirtazapine 30 mg into 1/4 pills and giving her that as needed.  It is cheaper for her to do it that way evidently and he does not mind splitting the pills.  It is working well.    "I feel good."  Able to enjoy puzzle books, has a new dog. "I love my [place]." Energy and motivation are good for her.  "I can't get around like I used to but I do just fine." Granddtr bathes her.  Pt can make her own tea.  Her son, Harvie Heck, and his wife help her a lot. "I love my son."  States the [place]  is working well.  Her memory is about the same.  She sees Dr. Ellan Lambert, neuro for the Aricept and Namenda scripts.  Denies dizziness, syncope, seizures, numbness, tingling, tremor, tics, walks with a cane, slurred speech, confusion. Denies muscle or joint pain, stiffness, or dystonia.  Individual Medical History/ Review of Systems: Changes? :Yes  In hospital for CP, had cardiac cath, NSTEMI, Takotsubo cardiomyopathy  Past medications for mental health diagnoses include: Pristiq, Abilify, BuSpar, Paxil, lithium, Xanax, Cymbalta   Allergies: Codeine  Current Medications:  Current Outpatient Medications:  .  amLODipine (NORVASC) 10 MG tablet, Take 10 mg by mouth daily., Disp: , Rfl:  .  aspirin EC 81 MG EC tablet, Take 1 tablet (81 mg total) by mouth daily., Disp: 90 tablet, Rfl: 3 .  atorvastatin (LIPITOR) 20 MG tablet, Take 20 mg by mouth daily., Disp: , Rfl:  .  busPIRone (BUSPAR) 30 MG tablet, Take 1 tablet (30 mg total) by mouth daily., Disp: 90 tablet, Rfl: 0 .  cholecalciferol (VITAMIN D3) 25  MCG (1000 UT) tablet, Take 1,000 Units by mouth daily., Disp: , Rfl:  .  donepezil (ARICEPT) 10 MG tablet, Take 10 mg by mouth at bedtime. , Disp: , Rfl:  .  furosemide (LASIX) 40 MG tablet, Take 40 mg by mouth as needed for edema. , Disp: , Rfl:  .  linaclotide (LINZESS) 145 MCG CAPS capsule, Take 145 mcg by mouth as needed. constipation, Disp: , Rfl:  .  lisinopril (PRINIVIL,ZESTRIL) 40 MG tablet, Take 40 mg by mouth daily. , Disp: , Rfl:  .  memantine (NAMENDA XR) 28 MG CP24 24 hr capsule, Take 28 mg by mouth daily. , Disp: , Rfl: 0 .  mirtazapine (REMERON) 30 MG tablet, Take 30 mg by mouth at bedtime., Disp: , Rfl:  .  Multiple Vitamins-Minerals (MULTI-VITAMIN GUMMIES PO), Take by mouth., Disp: , Rfl:  .  nitroGLYCERIN (NITROSTAT) 0.4 MG SL tablet, Place 1 tablet (0.4 mg total) under the tongue every 5 (five) minutes as needed for chest pain., Disp: 25 tablet, Rfl: 2 .  oxyCODONE (ROXICODONE) 15 MG immediate release tablet, Take 7.5 mg by mouth 4 (four) times daily as needed for pain. , Disp: , Rfl:  .  potassium chloride (MICRO-K) 10 MEQ CR capsule, Take 10 mEq by mouth daily as needed. Take with furosemide, Disp: , Rfl:  .  RESTASIS 0.05 % ophthalmic emulsion, Place 1 drop into both  eyes daily as needed (dry eyes). , Disp: , Rfl:  .  zinc gluconate 50 MG tablet, Take 25 mg by mouth daily., Disp: , Rfl:  Medication Side Effects: none  Family Medical/ Social History: Changes? No  MENTAL HEALTH EXAM:  There were no vitals taken for this visit.There is no height or weight on file to calculate BMI.  General Appearance: Casual, Neat, Well Groomed and Obese  Eye Contact:  Good  Speech:  Clear and Coherent  Volume:  Normal  Mood:  Euthymic  Affect:  Appropriate  Thought Process:  Goal Directed and Descriptions of Associations: Intact  Orientation:  Full (Time, Place, and Person)  Thought Content: Logical   Suicidal Thoughts:  No  Homicidal Thoughts:  No  Memory:  Immediate;    Fair Recent;   Fair Remote;   Fair  Judgement:  Good  Insight:  Good  Psychomotor Activity:  She is slow and walks with a cane and assistance of her son.  Concentration:  Concentration: Good  Recall:  Good  Fund of Knowledge: Good  Language: Good  Assets:  Desire for Improvement  ADL's:  Intact  Cognition: WNL  Prognosis:  Good    DIAGNOSES:    ICD-10-CM   1. Major depressive disorder, recurrent episode, moderate (HCC)  F33.1   2. Generalized anxiety disorder  F41.1   3. Insomnia, unspecified type  G47.00   4. Memory loss  R41.3     Receiving Psychotherapy: No    RECOMMENDATIONS:  PDMP reviewed. I spent 20 mins w/ her.  Continue BuSpar 30 mg 1 twice daily Continue mirtazapine 30 mg, 1/4-1 nightly as needed sleep. Continue Aricept 10 mg nightly per neurology. Continue Namenda X are 28 mg daily per neurology. Return in 3 months.  Donnal Moat, PA-C

## 2019-10-17 ENCOUNTER — Encounter: Payer: Self-pay | Admitting: Cardiology

## 2019-10-22 NOTE — Telephone Encounter (Signed)
Called pt to see if she is interested in the cardiac rehab program, pt son answered and said that he would call back and get more details on scheduling pt, he also said that pt can not do too much exercise because her back has rods/screws in it. Explained to pt son about cardiac rehab he then said he would call back to potentially schedule pt. Placed pt ppw in the ready to schedule folder.

## 2019-11-03 ENCOUNTER — Ambulatory Visit (INDEPENDENT_AMBULATORY_CARE_PROVIDER_SITE_OTHER): Payer: Medicare Other | Admitting: Podiatry

## 2019-11-03 ENCOUNTER — Other Ambulatory Visit: Payer: Self-pay

## 2019-11-03 ENCOUNTER — Encounter: Payer: Self-pay | Admitting: Podiatry

## 2019-11-03 VITALS — Temp 96.9°F

## 2019-11-03 DIAGNOSIS — M2042 Other hammer toe(s) (acquired), left foot: Secondary | ICD-10-CM

## 2019-11-03 DIAGNOSIS — B351 Tinea unguium: Secondary | ICD-10-CM | POA: Diagnosis not present

## 2019-11-03 DIAGNOSIS — M79676 Pain in unspecified toe(s): Secondary | ICD-10-CM | POA: Diagnosis not present

## 2019-11-03 DIAGNOSIS — M2041 Other hammer toe(s) (acquired), right foot: Secondary | ICD-10-CM

## 2019-11-03 NOTE — Progress Notes (Signed)
This patient returns to the office for evaluation and treatment of long thick painful nails .  This patient is unable to trim his own nails since the patient cannot reach the feet.  Patient says the nails are painful walking and wearing his shoes.  He returns for preventive foot care services.  General Appearance  Alert, conversant and in no acute stress.  Vascular  Dorsalis pedis and posterior tibial  pulses are weakly  palpable  bilaterally.  Capillary return is within normal limits  bilaterally. Temperature is within normal limits  bilaterally.  Neurologic  Senn-Weinstein monofilament wire test within normal limits  bilaterally. Muscle power within normal limits bilaterally.  Nails Thick disfigured discolored nails with subungual debris  from hallux to fifth toes bilaterally. No evidence of bacterial infection or drainage bilaterally.  Orthopedic  No limitations of motion  feet .  No crepitus or effusions noted.  No bony pathology .  Hammer toes 2-5  B/L.  Skin  normotropic skin with no porokeratosis noted bilaterally.  No signs of infections or ulcers noted.     Onychomycosis  Pain in toes right foot  Pain in toes left foot  Debridement  of nails  1-5  B/L with a nail nipper.  Nails were then filed using a dremel tool with no incidents.    RTC    Helane Gunther DPM

## 2019-11-07 ENCOUNTER — Other Ambulatory Visit: Payer: Self-pay

## 2019-11-07 ENCOUNTER — Emergency Department (HOSPITAL_COMMUNITY): Payer: Medicare Other

## 2019-11-07 ENCOUNTER — Emergency Department (HOSPITAL_COMMUNITY)
Admission: EM | Admit: 2019-11-07 | Discharge: 2019-11-07 | Disposition: A | Discharge status: Home / Self Care | Payer: Medicare Other | Attending: Emergency Medicine | Admitting: Emergency Medicine

## 2019-11-07 DIAGNOSIS — Z7982 Long term (current) use of aspirin: Secondary | ICD-10-CM | POA: Diagnosis not present

## 2019-11-07 DIAGNOSIS — I10 Essential (primary) hypertension: Secondary | ICD-10-CM | POA: Diagnosis not present

## 2019-11-07 DIAGNOSIS — J45909 Unspecified asthma, uncomplicated: Secondary | ICD-10-CM | POA: Diagnosis not present

## 2019-11-07 DIAGNOSIS — Z79899 Other long term (current) drug therapy: Secondary | ICD-10-CM | POA: Insufficient documentation

## 2019-11-07 DIAGNOSIS — I209 Angina pectoris, unspecified: Secondary | ICD-10-CM | POA: Diagnosis not present

## 2019-11-07 DIAGNOSIS — Z87891 Personal history of nicotine dependence: Secondary | ICD-10-CM | POA: Insufficient documentation

## 2019-11-07 DIAGNOSIS — R0789 Other chest pain: Secondary | ICD-10-CM | POA: Diagnosis present

## 2019-11-07 LAB — CBC
HCT: 40.7 % (ref 36.0–46.0)
Hemoglobin: 12.8 g/dL (ref 12.0–15.0)
MCH: 32.6 pg (ref 26.0–34.0)
MCHC: 31.4 g/dL (ref 30.0–36.0)
MCV: 103.6 fL — ABNORMAL HIGH (ref 80.0–100.0)
Platelets: 183 10*3/uL (ref 150–400)
RBC: 3.93 MIL/uL (ref 3.87–5.11)
RDW: 12.8 % (ref 11.5–15.5)
WBC: 6.5 10*3/uL (ref 4.0–10.5)
nRBC: 0 % (ref 0.0–0.2)

## 2019-11-07 LAB — TROPONIN I (HIGH SENSITIVITY)
Troponin I (High Sensitivity): 6 ng/L (ref <18)
Troponin I (High Sensitivity): 5 ng/L (ref <18)

## 2019-11-07 LAB — BASIC METABOLIC PANEL
Anion gap: 10 (ref 5–15)
BUN: 9 mg/dL (ref 8–23)
CO2: 28 mmol/L (ref 22–32)
Calcium: 9.2 mg/dL (ref 8.9–10.3)
Chloride: 106 mmol/L (ref 98–111)
Creatinine, Ser: 0.93 mg/dL (ref 0.44–1.00)
GFR calc Af Amer: >60 mL/min (ref >60)
GFR calc non Af Amer: 58 mL/min — ABNORMAL LOW (ref >60)
Glucose, Bld: 133 mg/dL — ABNORMAL HIGH (ref 70–99)
Potassium: 3.8 mmol/L (ref 3.5–5.1)
Sodium: 144 mmol/L (ref 135–145)

## 2019-11-07 MED ORDER — SODIUM CHLORIDE 0.9% FLUSH: 3.0000 mL | Freq: Once | INTRAVENOUS | Status: DC

## 2019-11-07 NOTE — ED Provider Notes (Signed)
[facility] EMERGENCY DEPARTMENT Provider Note   CSN: [id number] Arrival date & time: 11/07/19  1312     History Chief Complaint  Patient presents with  . Chest Pain    [name] is a 79 y.o. female.  HPI  HPI: A 79 year old patient with a history of hypertension, hypercholesterolemia and obesity presents for evaluation of chest pain. Initial onset of pain was less than one hour ago. The patient's chest pain is described as heaviness/pressure/tightness, is not worse with exertion and is relieved by nitroglycerin. The patient's chest pain is middle- or left-sided, is not well-localized, is not sharp and does radiate to the arms/jaw/neck. The patient does not complain of nausea and denies diaphoresis. The patient has no history of stroke, has no history of peripheral artery disease, has not smoked in the past 90 days, denies any history of treated diabetes and has no relevant family history of coronary artery disease (first degree relative at less than age 70).   Patient reports that she started having chest pain around 10:00.  The pain was persistent until she got to the ER.  The pain was left-sided and radiating to the shoulder.  She took nitroglycerin and her pain resolved.  She had similar episodes earlier in February, a catheterization at that time revealed nonobstructive disease and patient is being medically managed.  Patient denies any cough, shortness of breath, diaphoresis, vomiting.  Past Medical History:  Diagnosis Date  . Arthritis   . Asthma   . Chronic back pain   . Depression   . History of pulmonary embolus (PE)   . Hx-TIA (transient ischemic attack)   . Hyperlipemia   . Hypertension   . Personal history of DVT (deep vein thrombosis)   . Scoliosis   . Sleep apnea    cpap - settingsi at 3   . 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 Hx-TIA (transient ischemic attack)   . Hyperlipemia    . Hypertension   . Sleep apnea   . History of pulmonary embolus (PE)   . NSTEMI (non-ST elevated myocardial infarction) ([place]) 09/16/2019  . 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 Depression 07/29/2018  . Acute pulmonary embolism ([place]) 09/29/2016  . Acute encephalopathy 09/28/2016  . Left knee pain 09/28/2016  . Left leg swelling 09/28/2016  . Hilar mass 09/28/2016  . OSA on CPAP 09/28/2016  . HTN (hypertension) 09/28/2016  . HLD (hyperlipidemia) 09/28/2016  . Chronic pain 09/28/2016  . Cellulitis of left leg 06/14/2016  . Osteoarthritis of left knee 05/17/2016  . 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 ARTHROSCOPIC REPAIR ACL  02/03/2004  . LEFT HEART CATH AND CORONARY ANGIOGRAPHY N/A 09/16/2019   Procedure: LEFT HEART CATH AND CORONARY ANGIOGRAPHY;  Surgeon: Nelva Bush, MD;  Location: [facility];  Service: Cardiovascular;  Laterality: N/A;  . OOPHORECTOMY  1970  . SPINE SURGERY  03/31/2006  . 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 TUBAL LIGATION  1983     OB History   No obstetric history on file.     Family History  Problem Relation Age of Onset  . Other Father        kidney problems  Social History   Tobacco Use  . Smoking status: Former Smoker    Packs/day: 1.00    Years: 10.00    Pack years: 10.00    Types: Cigarettes    Quit date: 08/12/1973    Years since quitting: 46.2  . Smokeless tobacco: Never Used  Substance Use Topics  . Alcohol use: No  . Drug use: No    Home Medications Prior to Admission medications   Medication Sig Start Date End Date Taking? Authorizing Provider  amLODipine (NORVASC) 10 MG tablet Take 10 mg by mouth daily.   Yes [provider]  aspirin EC 81 MG EC tablet  Take 1 tablet (81 mg total) by mouth daily. 09/18/19  Yes Duke, Roe Rutherford, PA  atorvastatin (LIPITOR) 20 MG tablet Take 20 mg by mouth at bedtime.    Yes [provider]  busPIRone (BUSPAR) 30 MG tablet Take 1 tablet (30 mg total) by mouth daily. Patient taking differently: Take 30 mg by mouth in the morning and at bedtime.  10/15/19  Yes Hurst, Teresa T, PA-C  donepezil (ARICEPT) 10 MG tablet Take 10 mg by mouth at bedtime.    Yes [provider]  furosemide (LASIX) 40 MG tablet Take 40 mg by mouth as needed for edema.    Yes [provider]  lisinopril (PRINIVIL,ZESTRIL) 40 MG tablet Take 40 mg by mouth daily.    Yes [provider]  memantine (NAMENDA XR) 28 MG CP24 24 hr capsule Take 28 mg by mouth daily.  04/28/17  Yes [provider]  nitroGLYCERIN (NITROSTAT) 0.4 MG SL tablet Place 1 tablet (0.4 mg total) under the tongue every 5 (five) minutes as needed for chest pain. 09/17/19  Yes Duke, Roe Rutherford, PA  potassium chloride (MICRO-K) 10 MEQ CR capsule Take 10 mEq by mouth daily as needed. Take with furosemide   Yes [provider]  cholecalciferol (VITAMIN D3) 25 MCG (1000 UT) tablet Take 1,000 Units by mouth daily.    [provider]  hydrochlorothiazide (HYDRODIURIL) 25 MG tablet Take 25 mg by mouth daily. 10/18/19   [provider]  linaclotide (LINZESS) 145 MCG CAPS capsule Take 145 mcg by mouth as needed. constipation    [provider]  mirtazapine (REMERON) 30 MG tablet Take 30 mg by mouth at bedtime.    [provider]  Multiple Vitamins-Minerals (MULTI-VITAMIN GUMMIES PO) Take by mouth.    [provider]  oxyCODONE (ROXICODONE) 15 MG immediate release tablet Take 7.5 mg by mouth 4 (four) times daily as needed for pain.  09/30/18   [provider]  RESTASIS 0.05 % ophthalmic emulsion Place 1 drop into both eyes daily as needed (dry eyes).  12/15/18   [provider]  zinc  gluconate 50 MG tablet Take 25 mg by mouth daily.    [provider]    Allergies    Codeine  Review of Systems   Review of Systems  Constitutional: Positive for activity change.  Respiratory: Positive for chest tightness. Negative for shortness of breath.   Cardiovascular: Positive for chest pain.  Gastrointestinal: Negative for nausea and vomiting.  Hematological: Does not bruise/bleed easily.  All other systems reviewed and are negative.   Physical Exam Updated Vital Signs BP (!) 145/85   Pulse 60   Temp 98.4 F (36.9 C) (Oral)   Resp 11   Ht 5\' 2"  (1.575 m)   Wt 84.8 kg   SpO2 97%   BMI 34.20 kg/m  Physical Exam Vitals and nursing note reviewed.  Constitutional:      Appearance: She is well-developed.  HENT:     Head: Normocephalic and atraumatic.  Cardiovascular:     Rate and Rhythm: Normal rate.  Pulmonary:     Effort: Pulmonary effort is normal.  Abdominal:     General: Bowel sounds are normal.  Musculoskeletal:     Cervical back: Normal range of motion and neck supple.  Skin:    General: Skin is warm and dry.  Neurological:     Mental Status: She is alert and oriented to person, place, and time.     ED Results / Procedures / Treatments   Labs (all labs ordered are listed, but only abnormal results are displayed) Labs Reviewed  BASIC METABOLIC PANEL - Abnormal; Notable for the following components:      Result Value   Glucose, Bld 133 (*)    GFR calc non Af Amer 58 (*)    All other components within normal limits  CBC - Abnormal; Notable for the following components:   MCV 103.6 (*)    All other components within normal limits  TROPONIN I (HIGH SENSITIVITY)  TROPONIN I (HIGH SENSITIVITY)    EKG EKG Interpretation  Date/Time:  Sunday November 07 2019 13:11:01 EDT Ventricular Rate:  90 PR Interval:  214 QRS Duration: 94 QT Interval:  376 QTC Calculation: 459 R Axis:   -58 Text Interpretation: Sinus rhythm with 1st degree A-V  block Left anterior fascicular block Septal infarct , age undetermined Possible Lateral infarct , age undetermined Abnormal ECG No acute changes No significant change since last tracing Confirmed by ,  ([id number]) on 11/07/2019 4:04:07 PM   Radiology DG Chest 2 View  Result Date: 11/07/2019 CLINICAL DATA:  Chest pain. EXAM: CHEST - 2 VIEW COMPARISON:  Radiographs dated 09/16/2019 and 09/28/2016 and chest CT dated 08/26/2018 FINDINGS: The heart size and pulmonary vascularity are normal and the lungs are clear. Aortic atherosclerosis. Numerous old fractures in the lower thoracic and lumbar spine with previous lumbar spine fusion. No change since the prior CT scan. IMPRESSION: No acute disease. Aortic Atherosclerosis (ICD10-I70.0). Electronically Signed   By: James  Maxwell M.D.   On: 11/07/2019 13:58    Procedures Procedures (including critical care time)  Medications Ordered in ED Medications  sodium chloride flush (NS) 0.9 % injection 3 mL (has no administration in time range)    ED Course  I have reviewed the triage vital signs and the nursing notes.  Pertinent labs & imaging results that were available during my care of the patient were reviewed by me and considered in my medical decision making (see chart for details).    MDM Rules/Calculators/A&P HEAR Score: 7                    78  year old female comes in a chief complaint of chest pain.  She has no known obstructive coronary artery disease due to atherosclerosis.  It appears that she is being medically managed for her chest pain.  Catheterization from February, 2021 reviewed.  Cardiac cath was reassuring.  Patient reports having chest pain that started at 10:00 and was not getting better.  She did take nitro prior to ED arrival and the pain started getting better.  When I saw her she had been chest pain-free for almost an hour.  Hear score is 7, however suspicion for ischemia as the underlying etiology for this chest pain  is lower.  Delta troponin  are both below the institutional cutoff.  Stable for discharge from CAD perspective.  Patient is comfortable with the plan and wants to go home as well.  The patient appears reasonably screened and/or stabilized for discharge and I doubt any other medical condition or other [facility] requiring further screening, evaluation, or treatment in the ED at this time prior to discharge.   Results from the ER workup discussed with the patient face to face and all questions answered to the best of my ability. The patient is safe for discharge with strict return precautions.   Final Clinical Impression(s) / ED Diagnoses Final diagnoses:  Angina pectoris [facility])    Rx / DC Orders ED Discharge Orders    None       Derwood Kaplan, MD 11/07/19 1931

## 2019-11-07 NOTE — ED Notes (Signed)
Pt ambulated to the bathroom with 1 person assist with no issues.

## 2019-11-07 NOTE — ED Triage Notes (Signed)
Pt to triage via [facility] from home.  Reports L sided chest pain and L shoulder pain since 10am.  Denies SOB, nausea, and vomiting.  Took 2 home NTG/ 324mg  ASA.  EMS administered 2 more NTG.

## 2019-11-07 NOTE — Discharge Instructions (Addendum)
We saw you in the ER for the chest pain/shortness of breath. All of our cardiac workup is normal, including labs, EKG and chest X-RAY are normal. Please follow-up with your primary care doctor for continued management.  Please return to the ER if you have worsening chest pain, shortness of breath, pain radiating to your jaw, shoulder, or back, sweats or fainting. Otherwise see the Cardiologist or your primary care doctor as requested.

## 2019-11-18 ENCOUNTER — Telehealth (HOSPITAL_COMMUNITY): Payer: Self-pay

## 2019-11-18 NOTE — Telephone Encounter (Signed)
Called pt son to see if pt would like to participate in the cardiac rehab program pt son stated yes and that he couldn't talk right now and that he was planning on calling to schedule pt next week. Place pt ppw in the ready to schedule folder.

## 2019-12-06 NOTE — Telephone Encounter (Signed)
[name] called to schedule pt for CR. Patient will come in for orientation on 12/21/19 @ 830AM and will attend the 915AM exercise class.  Mailed letter

## 2019-12-13 ENCOUNTER — Other Ambulatory Visit: Payer: Self-pay

## 2019-12-13 ENCOUNTER — Ambulatory Visit (HOSPITAL_COMMUNITY): Payer: Medicare Other | Attending: Cardiology

## 2019-12-13 DIAGNOSIS — I428 Other cardiomyopathies: Secondary | ICD-10-CM | POA: Insufficient documentation

## 2019-12-21 ENCOUNTER — Other Ambulatory Visit: Payer: Self-pay

## 2019-12-21 ENCOUNTER — Encounter (HOSPITAL_COMMUNITY)
Admission: RE | Admit: 2019-12-21 | Discharge: 2019-12-21 | Disposition: A | Discharge status: Home / Self Care | Payer: Medicare Other | Source: Ambulatory Visit | Attending: Cardiology | Admitting: Cardiology

## 2019-12-21 ENCOUNTER — Encounter (HOSPITAL_COMMUNITY): Payer: Self-pay

## 2019-12-21 ENCOUNTER — Ambulatory Visit (HOSPITAL_COMMUNITY): Payer: Medicare Other

## 2019-12-21 VITALS — BP 108/70 | Temp 97.0°F | Wt 196.0 lb

## 2019-12-21 DIAGNOSIS — I5181 Takotsubo syndrome: Secondary | ICD-10-CM | POA: Insufficient documentation

## 2019-12-21 DIAGNOSIS — I214 Non-ST elevation (NSTEMI) myocardial infarction: Secondary | ICD-10-CM | POA: Insufficient documentation

## 2019-12-21 NOTE — Progress Notes (Signed)
Cardiac Rehab Note:  Today, Ms. [name] presented to her cardiac rehab orientation appointment as scheduled, dropped off by her son, [name]. Patient resides with granddaughter however is assisted with her medical care by son and daughter-in-law. Patient has a history of major depression, anxiety, and dementia and currently engaged with mental health and neurology. Health history completed today with son via telephone. Patient continues to states "I don't want to do this" when asked if she would like to participate in cardiac rehab exercise sessions 3 days a week. Patient also states "I am afraid" multiple times. When asked by this RN what patient was afraid of she states "I am afraid I can't do this". Of note patient has significant difficulty with ambulation and c/o chronic back pain 8/10. Discussed with son via telephone patients anxiety and continued statement of "I don't want to do this" and it is decide that patient would be better served with exercise at home as tolerated. Patient was discharged home in son's care in stable condition.   Plan: Patient will not attend CR exercise session. Cardiology will be notified by routing of this note.  Ronel Rodeheaver E. Suzie Portela RN, BSN [place]. [facility]  Cardiac and Pulmonary Rehabilitation Phone: [phone] Fax: [phone]

## 2019-12-23 ENCOUNTER — Ambulatory Visit (HOSPITAL_COMMUNITY): Payer: Medicare Other

## 2019-12-27 ENCOUNTER — Ambulatory Visit (HOSPITAL_COMMUNITY): Payer: Medicare Other

## 2019-12-29 ENCOUNTER — Ambulatory Visit (HOSPITAL_COMMUNITY): Payer: Medicare Other

## 2020-01-03 ENCOUNTER — Ambulatory Visit (HOSPITAL_COMMUNITY): Payer: Medicare Other

## 2020-01-04 ENCOUNTER — Telehealth: Payer: Self-pay | Admitting: Cardiology

## 2020-01-04 NOTE — Telephone Encounter (Signed)
[name] is returning Alisha's call in regards to Anala's Echo results. Please advise.

## 2020-01-04 NOTE — Telephone Encounter (Signed)
Spoke with pt son, aware of echo and follow up scheduled.

## 2020-01-05 ENCOUNTER — Ambulatory Visit (HOSPITAL_COMMUNITY): Payer: Medicare Other

## 2020-01-07 ENCOUNTER — Ambulatory Visit (HOSPITAL_COMMUNITY): Payer: Medicare Other

## 2020-01-11 ENCOUNTER — Encounter: Payer: Self-pay | Admitting: Physician Assistant

## 2020-01-11 ENCOUNTER — Ambulatory Visit (INDEPENDENT_AMBULATORY_CARE_PROVIDER_SITE_OTHER): Payer: Medicare Other | Admitting: Physician Assistant

## 2020-01-11 ENCOUNTER — Other Ambulatory Visit: Payer: Self-pay

## 2020-01-11 DIAGNOSIS — F411 Generalized anxiety disorder: Secondary | ICD-10-CM | POA: Diagnosis not present

## 2020-01-11 DIAGNOSIS — R413 Other amnesia: Secondary | ICD-10-CM | POA: Diagnosis not present

## 2020-01-11 DIAGNOSIS — F3342 Major depressive disorder, recurrent, in full remission: Secondary | ICD-10-CM

## 2020-01-11 DIAGNOSIS — G4700 Insomnia, unspecified: Secondary | ICD-10-CM | POA: Diagnosis not present

## 2020-01-11 MED ORDER — BUSPIRONE HCL 30 MG PO TABS: 30.0000 mg | ORAL_TABLET | Freq: Two times a day (BID) | ORAL | 0 refills | Status: DC

## 2020-01-11 NOTE — Progress Notes (Signed)
[facility] Med Check  Patient ID: [name],  MRN: [id number]  PCP: Nolene Ebbs, MD  Date of Evaluation: 01/11/2020 Time spent:20 minutes  Chief Complaint:  Chief Complaint    Insomnia; Anxiety; Follow-up      HISTORY/CURRENT STATUS: HPI For routine med check.  Accompanied by her son [name].  Doing really well. Feels good on her meds.  States she sleeps well.  [name] is cutting the mirtazapine 30 mg into 1/4 pills and giving her that as needed. (He's not sure of the dose.)  It is working well.    Feels good.  Walks to the bathroom and ktichen several times a day.  She is able to enjoy things.  She still enjoys her [place].  Does not cry easily.  No SI/HI.  States the BuSpar is working well.  Her memory is about the same.  She sees Dr. Audelia Acton, neuro for the Aricept and Namenda scripts.  Denies dizziness, syncope, seizures, numbness, tingling, tremor, tics, walks with a cane, slurred speech, confusion.  She continues to take Aricept and Namenda for memory, per the neurologist.  She does have muscle stiffness and arthritis in many joints.  Nothing new.  Individual Medical History/ Review of Systems: Changes? :No    Past medications for mental health diagnoses include: Pristiq, Abilify, BuSpar, Paxil, lithium, Xanax, Cymbalta  Allergies: Codeine  Current Medications:  Current Outpatient Medications:    amLODipine (NORVASC) 10 MG tablet, Take 10 mg by mouth daily., Disp: , Rfl:    aspirin EC 81 MG EC tablet, Take 1 tablet (81 mg total) by mouth daily., Disp: 90 tablet, Rfl: 3   atorvastatin (LIPITOR) 20 MG tablet, Take 20 mg by mouth at bedtime. , Disp: , Rfl:    busPIRone (BUSPAR) 30 MG tablet, Take 1 tablet (30 mg total) by mouth in the morning and at bedtime., Disp: 180 tablet, Rfl: 0   cholecalciferol (VITAMIN D3) 25 MCG (1000 UT) tablet, Take 1,000 Units by mouth daily., Disp: , Rfl:    donepezil (ARICEPT) 10 MG tablet, Take 10 mg by mouth at bedtime. ,  Disp: , Rfl:    furosemide (LASIX) 40 MG tablet, Take 40 mg by mouth as needed for edema. PRN, Disp: , Rfl:    hydrochlorothiazide (HYDRODIURIL) 25 MG tablet, Take 25 mg by mouth daily., Disp: , Rfl:    linaclotide (LINZESS) 145 MCG CAPS capsule, Take 145 mcg by mouth as needed. constipation, Disp: , Rfl:    memantine (NAMENDA XR) 28 MG CP24 24 hr capsule, Take 28 mg by mouth daily. , Disp: , Rfl: 0   mirtazapine (REMERON) 30 MG tablet, Take 15 mg by mouth at bedtime. 1/4-1/2 prn sleep, Disp: , Rfl:    Multiple Vitamins-Minerals (MULTI-VITAMIN GUMMIES PO), Take by mouth., Disp: , Rfl:    nitroGLYCERIN (NITROSTAT) 0.4 MG SL tablet, Place 1 tablet (0.4 mg total) under the tongue every 5 (five) minutes as needed for chest pain., Disp: 25 tablet, Rfl: 2   oxyCODONE (ROXICODONE) 15 MG immediate release tablet, Take 7.5 mg by mouth 4 (four) times daily as needed for pain. , Disp: , Rfl:    potassium chloride (MICRO-K) 10 MEQ CR capsule, Take 10 mEq by mouth daily as needed. Take with furosemide, Disp: , Rfl:    RESTASIS 0.05 % ophthalmic emulsion, Place 1 drop into both eyes daily as needed (dry eyes). , Disp: , Rfl:    zinc gluconate 50 MG tablet, Take 25 mg by mouth daily., Disp: ,  Rfl:    lisinopril (PRINIVIL,ZESTRIL) 40 MG tablet, Take 40 mg by mouth daily. , Disp: , Rfl:  Medication Side Effects: none  Family Medical/ Social History: Changes? No  MENTAL HEALTH EXAM:  There were no vitals taken for this visit.There is no height or weight on file to calculate BMI.  General Appearance: Casual, Neat and Well Groomed  Eye Contact:  Good  Speech:  Clear and Coherent  Volume:  Normal  Mood:  Euthymic  Affect:  Appropriate  Thought Process:  Goal Directed and Descriptions of Associations: Intact  Orientation:  Full (Time, Place, and Person)  Thought Content: Logical   Suicidal Thoughts:  No  Homicidal Thoughts:  No  Memory:  Immediate;   Fair Recent;   Fair Remote;   Fair   Judgement:  Good  Insight:  Good  Psychomotor Activity:  She is slow and walks with a cane and assistance of her son.  Concentration:  Concentration: Good  Recall:  Good  Fund of Knowledge: Good  Language: Good  Assets:  Desire for Improvement  ADL's:  Intact  Cognition: WNL  Prognosis:  Good    DIAGNOSES:    ICD-10-CM   1. Insomnia, unspecified type  G47.00   2. Generalized anxiety disorder  F41.1   3. Memory loss  R41.3   4. Recurrent major depressive disorder, in full remission (HCC)  F33.42     Receiving Psychotherapy: No    RECOMMENDATIONS:  PDMP reviewed. I spent 20 mins w/ her.  I am glad to see her doing really well overall! Continue BuSpar 30 mg 1 twice daily Continue mirtazapine 30 mg, 1/4-1 nightly as needed sleep.  (I have asked her son Harvie Heck to call back with the dose of the mirtazapine.  My records say 30 mg and he thinks she is on 15 mg and quartering that.) Continue Aricept 10 mg nightly per neurology. Continue Namenda XR 28 mg daily per neurology. Return in 3 months.  Melony Overly, PA-C

## 2020-01-12 ENCOUNTER — Ambulatory Visit (HOSPITAL_COMMUNITY): Payer: Medicare Other

## 2020-01-14 ENCOUNTER — Telehealth (INDEPENDENT_AMBULATORY_CARE_PROVIDER_SITE_OTHER): Payer: Medicare Other | Admitting: Cardiology

## 2020-01-14 ENCOUNTER — Ambulatory Visit (HOSPITAL_COMMUNITY): Payer: Medicare Other

## 2020-01-14 VITALS — BP 108/51 | HR 55 | Ht 62.0 in | Wt 187.0 lb

## 2020-01-14 DIAGNOSIS — E7800 Pure hypercholesterolemia, unspecified: Secondary | ICD-10-CM

## 2020-01-14 DIAGNOSIS — I209 Angina pectoris, unspecified: Secondary | ICD-10-CM | POA: Diagnosis not present

## 2020-01-14 DIAGNOSIS — I10 Essential (primary) hypertension: Secondary | ICD-10-CM | POA: Diagnosis not present

## 2020-01-14 DIAGNOSIS — I5181 Takotsubo syndrome: Secondary | ICD-10-CM | POA: Diagnosis not present

## 2020-01-14 DIAGNOSIS — I428 Other cardiomyopathies: Secondary | ICD-10-CM | POA: Diagnosis not present

## 2020-01-14 NOTE — Progress Notes (Addendum)
Virtual Visit via Video Note   This visit type was conducted due to national recommendations for restrictions regarding the COVID-19 Pandemic (e.g. social distancing) in an effort to limit this patient's exposure and mitigate transmission in our community.  Due to her co-morbid illnesses, this patient is at least at moderate risk for complications without adequate follow up.  This format is felt to be most appropriate for this patient at this time.  All issues noted in this document were discussed and addressed.  A limited physical exam was performed with this format.  Please refer to the patient's chart for her consent to telehealth for [facility]   The patient was identified using 2 identifiers.  Patient Location: Home Provider Location: Home Office  Date:  01/14/2020   ID:  [name], DOB 03-30-1941, MRN [id number]  PCP:  [name], MD  Cardiologist:  Jodelle Red, MD  Referring MD: [name], MD   CC: follow up  History of Present Illness:    [name] is a 79 y.o. female with a hx of hypertension, asthma, hyperlipidemia, prior DVT/PE.   Cardiac history: She was discharged on 09/17/19 after admission for chest pain, elevated troponins consistent with NSTEMI. Cath showed nonobstructive CAD. Echo showed cardiomyopathy with EF 30-35%. 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 Unable to use beta blocker due to resting bradycardia, and she was already on an ACEi. She was discharged with PRN lasix for volume overload. Plan for repeat echo 12/2019.  Today: Blood pressure has been more 110s/70s. Always tired, especially after dinner. Son taking care of her. No chest pain. Rare ankle swelling, improves with lasix.   Reviewed echo from 5/21, echo has normalized. Tolerating medications well.   Denies chest pain, shortness of breath at rest or with normal exertion. No PND, orthopnea, LE edema or  unexpected weight gain. No syncope or palpitations.  Past Medical History:  Diagnosis Date  . Arthritis   . Asthma   . Chronic back pain   . Depression   . History of pulmonary embolus (PE)   . Hx-TIA (transient ischemic attack)   . Hyperlipemia   . Hypertension   . Personal history of DVT (deep vein thrombosis)   . Scoliosis   . Sleep apnea    cpap - settingsi at 2   . 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 ARTHROSCOPIC REPAIR ACL  02/03/2004  . LEFT HEART CATH AND CORONARY ANGIOGRAPHY N/A 09/16/2019   Procedure: LEFT HEART CATH AND CORONARY ANGIOGRAPHY;  Surgeon: Yvonne Kendall, MD;  Location: [facility] INVASIVE [facility];  Service: Cardiovascular;  Laterality: N/A;  . OOPHORECTOMY  1970  . SPINE SURGERY  03/31/2006  . TOTAL KNEE ARTHROPLASTY Left 05/17/2016   Procedure: LEFT TOTAL KNEE ARTHROPLASTY;  Surgeon: Kathryne Hitch, MD;  Location: [facility] ORS;  Service: Orthopedics;  Laterality: Left;  Adductor Block; Spinal to General  . TUBAL LIGATION  1983    Current Medications: Current Outpatient Medications on File Prior to Visit  Medication Sig  . amLODipine (NORVASC) 10 MG tablet Take 10 mg by mouth daily.  Marland Kitchen aspirin EC 81 MG EC tablet Take 1 tablet (81 mg total) by mouth daily.  Marland Kitchen atorvastatin (LIPITOR) 20 MG tablet Take 20 mg by mouth at bedtime.   . busPIRone (BUSPAR) 30 MG tablet Take 1 tablet (30 mg total) by mouth in the morning and at bedtime.  Marland Kitchen  cholecalciferol (VITAMIN D3) 25 MCG (1000 UT) tablet Take 1,000 Units by mouth daily.  Marland Kitchen donepezil (ARICEPT) 10 MG tablet Take 10 mg by mouth at bedtime.   . furosemide (LASIX) 40 MG tablet Take 40 mg by mouth as needed for edema. PRN  . hydrochlorothiazide (HYDRODIURIL) 25 MG tablet Take 25 mg by mouth daily.  Marland Kitchen linaclotide (LINZESS) 145 MCG CAPS capsule Take 145 mcg by mouth as needed. constipation  . lisinopril (PRINIVIL,ZESTRIL) 40 MG tablet Take 40 mg by mouth daily.   .  memantine (NAMENDA XR) 28 MG CP24 24 hr capsule Take 28 mg by mouth daily.   . mirtazapine (REMERON) 30 MG tablet Take 15 mg by mouth at bedtime. 1/4-1/2 prn sleep  . Multiple Vitamins-Minerals (MULTI-VITAMIN GUMMIES PO) Take by mouth.  . nitroGLYCERIN (NITROSTAT) 0.4 MG SL tablet Place 1 tablet (0.4 mg total) under the tongue every 5 (five) minutes as needed for chest pain.  Marland Kitchen oxyCODONE (ROXICODONE) 15 MG immediate release tablet Take 7.5 mg by mouth 4 (four) times daily as needed for pain.   . potassium chloride (MICRO-K) 10 MEQ CR capsule Take 10 mEq by mouth daily as needed. Take with furosemide  . RESTASIS 0.05 % ophthalmic emulsion Place 1 drop into both eyes daily as needed (dry eyes).   . zinc gluconate 50 MG tablet Take 25 mg by mouth daily.   No current facility-administered medications on file prior to visit.     Allergies:   Codeine   Social History   Tobacco Use  . Smoking status: Former Smoker    Packs/day: 1.00    Years: 10.00    Pack years: 10.00    Types: Cigarettes    Quit date: 08/12/1973    Years since quitting: 46.4  . Smokeless tobacco: Never Used  Substance Use Topics  . Alcohol use: No  . Drug use: No    Family History: family history includes Other in her father.  ROS:   Please see the history of present illness.  Additional pertinent ROS otherwise unremarkable.  EKGs/Labs/Other Studies Reviewed:    The following studies were reviewed today: Echo 12/13/19 1. Left ventricular ejection fraction, by estimation, is 55 to 60%. The  left ventricle has normal function. The left ventricle has no regional  wall motion abnormalities. There is mild left ventricular hypertrophy.  Left ventricular diastolic parameters  are consistent with Grade I diastolic dysfunction (impaired relaxation).  GLS -21.1%.  2. Right ventricular systolic function is normal. The right ventricular  size is normal. There is mildly elevated pulmonary artery systolic  pressure. The  estimated right ventricular systolic pressure is 32.9 mmHg.  3. Left atrial size was mildly dilated.  4. The mitral valve is normal in structure. Trivial mitral valve  regurgitation. No evidence of mitral stenosis.  5. The aortic valve is tricuspid. Aortic valve regurgitation is mild.  Mild to moderate aortic valve sclerosis/calcification is present, without  any evidence of aortic stenosis.  6. The inferior vena cava is dilated in size with <50% respiratory  variability, suggesting right atrial pressure of 15 mmHg.   Cath 09/16/19 Conclusions: 1. Tortuous coronary arteries with minimal luminal irregularities but no angiographically significant coronary artery disease. 2. Moderately reduced left ventricular systolic function with akinesis of the mid LV segments and relatively normal contraction of the basilar and apical segments.  This most likely represents a variant of stress-induced cardiomyopathy. 3. Mildly elevated left ventricular filling pressure. 4. Incompletely imaged dilated thoracic aorta, as noted on  prior CT of the chest.  Recommend continued outpatient follow-up with cross-sectional imaging.  Recommendations: 1. Medical therapy of stress-induced cardiomyopathy.  I think it is reasonable to continue lisinopril 40 mg daily.  Beta blocker is precluded by resting bradycardia. 2. Primary prevention of coronary artery disease. 3. Overnight monitoring with possible discharge home tomorrow.  Echo 09/16/19 1. Left ventricular ejection fraction, by visual estimation, is 30 to  35%. The left ventricle has moderate to severely decreased function. There  is no left ventricular hypertrophy.  2. Moderately dilated left ventricular internal cavity size.  3. The left ventricle demonstrates regional wall motion abnormalities.  4. Inferior wall hypokinesis septal and apical akinesis.  5. Global right ventricle has normal systolic function.The right  ventricular size is normal. No  increase in right ventricular wall  thickness.  6. Left atrial size was mildly dilated.  7. Right atrial size was mildly dilated.  8. The mitral valve is normal in structure. Mild mitral valve  regurgitation.  9. The tricuspid valve is normal in structure.  10. The tricuspid valve is normal in structure. Tricuspid valve  regurgitation is mild.  11. The aortic valve is tricuspid. Aortic valve regurgitation is mild.  Mild to moderate aortic valve sclerosis/calcification without any evidence  of aortic stenosis.  12. Pulmonic regurgitation is mild.  13. The pulmonic valve was not well visualized. Pulmonic valve  regurgitation is mild.  14. The aortic root was not well visualized.  15. Mildly elevated pulmonary artery systolic pressure.  16. The interatrial septum was not well visualized.   EKG:  EKG is personally reviewed.  The ekg ordered 09/17/19 demonstrates sinus bradycardia, first degree AV block, diffuse T wave inversions, prolonged QT  Recent Labs: 09/16/2019: ALT 17; B Natriuretic Peptide 91.2; Magnesium 2.0; TSH 0.113 11/07/2019: BUN 9; Creatinine, Ser 0.93; Hemoglobin 12.8; Platelets 183; Potassium 3.8; Sodium 144  Recent Lipid Panel    Component Value Date/Time   CHOL 153 09/17/2019 0242   TRIG 99 09/17/2019 0242   HDL 45 09/17/2019 0242   CHOLHDL 3.4 09/17/2019 0242   VLDL 20 09/17/2019 0242   LDLCALC 88 09/17/2019 0242    Physical Exam:    VS:  BP (!) 108/51   Pulse (!) 55   Ht 5\' 2"  (1.575 m)   Wt 187 lb (84.8 kg)   BMI 34.20 kg/m     Wt Readings from Last 3 Encounters:  01/14/20 187 lb (84.8 kg)  12/21/19 195 lb 15.8 oz (88.9 kg)  11/07/19 187 lb (84.8 kg)    Speaking comfortably on the phone, no audible wheezing In no acute distress Alert and oriented Normal affect Normal speech  ASSESSMENT:    No diagnosis found. PLAN:    History of reduced EF, now normalized EF. 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  -on lisinopril for reduced EF, tolerating. Continue now that EF normalized. -repeat echo 12/2019 with normalized EF -has PRN furosemide for weight gain, has been stable. Has potassium for when she takes lasix  Hypertension: well controlled today -continue amlodipine 10 mg daily, lisinopril 40 mg daily  Hypercholesterolemia: -continue atorvastatin 20 mg daily -without CAD, goal LDL <100 -we did discuss dropping the aspirin as she does not have CAD. Remains on this due to personal preference.  Cardiac risk counseling and prevention recommendations: -recommend heart healthy/Mediterranean diet, with whole grains, fruits, vegetable, fish, lean meats, nuts, and olive oil. Limit salt. -recommend moderate walking, 3-5 times/week for  30-50 minutes each session. Aim for at least 150 minutes.week. Goal should be pace of 3 miles/hours, or walking 1.5 miles in 30 minutes -recommend avoidance of tobacco products. Avoid excess alcohol.  Plan for follow up: 6 mos  Total time of encounter: 22 minutes total time in face-to-face patient care. Remainder of non-face-to-face time involved reviewing chart documents/testing relevant to the patient encounter and documentation in the medical record.  Jodelle Red, MD, PhD [place]  [facility]   Medication Adjustments/Labs and Tests Ordered: Current medicines are reviewed at length with the patient today.  Concerns regarding medicines are outlined above.  No orders of the defined types were placed in this encounter.  No orders of the defined types were placed in this encounter.   Patient Instructions  Medication Instructions:  Your Physician recommend you continue on your current medication as directed.    *If you need a refill on your cardiac medications before your next appointment, please call your pharmacy*   Lab Work: None   Testing/Procedures: None   Follow-Up: At [facility], you and your health needs are our priority.  As part of our continuing mission to provide you with exceptional heart care, we have created designated Provider Care Teams.  These Care Teams include your primary Cardiologist (physician) and Advanced Practice Providers (APPs -  Physician Assistants and Nurse Practitioners) who all work together to provide you with the care you need, when you need it.  We recommend signing up for the patient portal called "MyChart".  Sign up information is provided on this After Visit Summary.  MyChart is used to connect with patients for Virtual Visits (Telemedicine).  Patients are able to view lab/test results, encounter notes, upcoming appointments, etc.  Non-urgent messages can be sent to your provider as well.   To learn more about what you can do with MyChart, go to [email or web address].    Your next appointment:   6 month  The format for your next appointment:   In Person  Provider:   Jodelle Red, MD       Signed, Jodelle Red, MD PhD 01/14/2020  [facility]

## 2020-01-14 NOTE — Patient Instructions (Signed)
Medication Instructions:  Your Physician recommend you continue on your current medication as directed.    *If you need a refill on your cardiac medications before your next appointment, please call your pharmacy*   Lab Work: None   Testing/Procedures: None   Follow-Up: At [facility], you and your health needs are our priority.  As part of our continuing mission to provide you with exceptional heart care, we have created designated Provider Care Teams.  These Care Teams include your primary Cardiologist (physician) and Advanced Practice Providers (APPs -  Physician Assistants and Nurse Practitioners) who all work together to provide you with the care you need, when you need it.  We recommend signing up for the patient portal called "MyChart".  Sign up information is provided on this After Visit Summary.  MyChart is used to connect with patients for Virtual Visits (Telemedicine).  Patients are able to view lab/test results, encounter notes, upcoming appointments, etc.  Non-urgent messages can be sent to your provider as well.   To learn more about what you can do with MyChart, go to [email or web address].    Your next appointment:   6 month  The format for your next appointment:   In Person  Provider:   Bridgette Christopher, MD     

## 2020-01-17 ENCOUNTER — Ambulatory Visit (HOSPITAL_COMMUNITY): Payer: Medicare Other

## 2020-01-19 ENCOUNTER — Ambulatory Visit (HOSPITAL_COMMUNITY): Payer: Medicare Other

## 2020-01-21 ENCOUNTER — Ambulatory Visit (HOSPITAL_COMMUNITY): Payer: Medicare Other

## 2020-01-24 ENCOUNTER — Ambulatory Visit (HOSPITAL_COMMUNITY): Payer: Medicare Other

## 2020-01-26 ENCOUNTER — Ambulatory Visit (HOSPITAL_COMMUNITY): Payer: Medicare Other

## 2020-01-28 ENCOUNTER — Ambulatory Visit (HOSPITAL_COMMUNITY): Payer: Medicare Other

## 2020-01-31 ENCOUNTER — Ambulatory Visit (HOSPITAL_COMMUNITY): Payer: Medicare Other

## 2020-02-02 ENCOUNTER — Ambulatory Visit (HOSPITAL_COMMUNITY): Payer: Medicare Other

## 2020-02-04 ENCOUNTER — Ambulatory Visit (INDEPENDENT_AMBULATORY_CARE_PROVIDER_SITE_OTHER): Payer: Medicare Other | Admitting: Podiatry

## 2020-02-04 ENCOUNTER — Encounter: Payer: Self-pay | Admitting: Podiatry

## 2020-02-04 ENCOUNTER — Other Ambulatory Visit: Payer: Self-pay

## 2020-02-04 DIAGNOSIS — M79676 Pain in unspecified toe(s): Secondary | ICD-10-CM | POA: Diagnosis not present

## 2020-02-04 DIAGNOSIS — B351 Tinea unguium: Secondary | ICD-10-CM | POA: Diagnosis not present

## 2020-02-04 NOTE — Progress Notes (Signed)
This patient returns to the office for evaluation and treatment of long thick painful nails .  This patient is unable to trim his own nails since the patient cannot reach the feet.  Patient says the nails are painful walking and wearing his shoes.  He returns for preventive foot care services.  General Appearance  Alert, conversant and in no acute stress.  Vascular  Dorsalis pedis and posterior tibial  pulses are weakly  palpable  bilaterally.  Capillary return is within normal limits  bilaterally. Temperature is within normal limits  bilaterally.  Neurologic  Senn-Weinstein monofilament wire test within normal limits  bilaterally. Muscle power within normal limits bilaterally.  Nails Thick disfigured discolored nails with subungual debris  from hallux to fifth toes bilaterally. No evidence of bacterial infection or drainage bilaterally.  Orthopedic  No limitations of motion  feet .  No crepitus or effusions noted.  No bony pathology .  Hammer toes 2-5  B/L.  Skin  normotropic skin with no porokeratosis noted bilaterally.  No signs of infections or ulcers noted.     Onychomycosis  Pain in toes right foot  Pain in toes left foot  Debridement  of nails  1-5  B/L with a nail nipper.  Nails were then filed using a dremel tool with no incidents.    RTC    Helane Gunther DPM

## 2020-02-07 ENCOUNTER — Ambulatory Visit (HOSPITAL_COMMUNITY): Payer: Medicare Other

## 2020-02-09 ENCOUNTER — Ambulatory Visit (HOSPITAL_COMMUNITY): Payer: Medicare Other

## 2020-02-11 ENCOUNTER — Ambulatory Visit (HOSPITAL_COMMUNITY): Payer: Medicare Other

## 2020-02-15 ENCOUNTER — Encounter: Payer: Self-pay | Admitting: Cardiology

## 2020-02-16 ENCOUNTER — Inpatient Hospital Stay (HOSPITAL_COMMUNITY)
Admission: EM | Admit: 2020-02-16 | Discharge: 2020-02-21 | DRG: 041 | Disposition: A | Discharge status: Rehabilitation Facility | Payer: Medicare Other | Attending: Neurology | Admitting: Neurology

## 2020-02-16 ENCOUNTER — Ambulatory Visit (HOSPITAL_COMMUNITY): Payer: Medicare Other

## 2020-02-16 ENCOUNTER — Other Ambulatory Visit: Payer: Self-pay

## 2020-02-16 ENCOUNTER — Emergency Department (HOSPITAL_COMMUNITY): Payer: Medicare Other

## 2020-02-16 DIAGNOSIS — H499 Unspecified paralytic strabismus: Secondary | ICD-10-CM | POA: Diagnosis present

## 2020-02-16 DIAGNOSIS — I63312 Cerebral infarction due to thrombosis of left middle cerebral artery: Secondary | ICD-10-CM | POA: Diagnosis not present

## 2020-02-16 DIAGNOSIS — H532 Diplopia: Secondary | ICD-10-CM | POA: Diagnosis present

## 2020-02-16 DIAGNOSIS — Z86711 Personal history of pulmonary embolism: Secondary | ICD-10-CM | POA: Diagnosis not present

## 2020-02-16 DIAGNOSIS — Z86718 Personal history of other venous thrombosis and embolism: Secondary | ICD-10-CM

## 2020-02-16 DIAGNOSIS — Z7982 Long term (current) use of aspirin: Secondary | ICD-10-CM

## 2020-02-16 DIAGNOSIS — R29810 Facial weakness: Secondary | ICD-10-CM | POA: Diagnosis present

## 2020-02-16 DIAGNOSIS — I6350 Cerebral infarction due to unspecified occlusion or stenosis of unspecified cerebral artery: Secondary | ICD-10-CM | POA: Diagnosis not present

## 2020-02-16 DIAGNOSIS — R001 Bradycardia, unspecified: Secondary | ICD-10-CM | POA: Diagnosis not present

## 2020-02-16 DIAGNOSIS — G510 Bell's palsy: Secondary | ICD-10-CM | POA: Diagnosis present

## 2020-02-16 DIAGNOSIS — R471 Dysarthria and anarthria: Secondary | ICD-10-CM | POA: Diagnosis present

## 2020-02-16 DIAGNOSIS — I252 Old myocardial infarction: Secondary | ICD-10-CM

## 2020-02-16 DIAGNOSIS — J45909 Unspecified asthma, uncomplicated: Secondary | ICD-10-CM | POA: Diagnosis present

## 2020-02-16 DIAGNOSIS — T886XXA Anaphylactic reaction due to adverse effect of correct drug or medicament properly administered, initial encounter: Secondary | ICD-10-CM | POA: Diagnosis not present

## 2020-02-16 DIAGNOSIS — G4700 Insomnia, unspecified: Secondary | ICD-10-CM | POA: Diagnosis present

## 2020-02-16 DIAGNOSIS — E669 Obesity, unspecified: Secondary | ICD-10-CM | POA: Diagnosis present

## 2020-02-16 DIAGNOSIS — R413 Other amnesia: Secondary | ICD-10-CM | POA: Diagnosis present

## 2020-02-16 DIAGNOSIS — G4733 Obstructive sleep apnea (adult) (pediatric): Secondary | ICD-10-CM | POA: Diagnosis present

## 2020-02-16 DIAGNOSIS — M1990 Unspecified osteoarthritis, unspecified site: Secondary | ICD-10-CM | POA: Diagnosis present

## 2020-02-16 DIAGNOSIS — G479 Sleep disorder, unspecified: Secondary | ICD-10-CM | POA: Diagnosis not present

## 2020-02-16 DIAGNOSIS — Z6835 Body mass index (BMI) 35.0-35.9, adult: Secondary | ICD-10-CM

## 2020-02-16 DIAGNOSIS — I440 Atrioventricular block, first degree: Secondary | ICD-10-CM | POA: Diagnosis not present

## 2020-02-16 DIAGNOSIS — M419 Scoliosis, unspecified: Secondary | ICD-10-CM | POA: Diagnosis present

## 2020-02-16 DIAGNOSIS — K5903 Drug induced constipation: Secondary | ICD-10-CM | POA: Diagnosis not present

## 2020-02-16 DIAGNOSIS — I10 Essential (primary) hypertension: Secondary | ICD-10-CM | POA: Diagnosis present

## 2020-02-16 DIAGNOSIS — G8929 Other chronic pain: Secondary | ICD-10-CM | POA: Diagnosis present

## 2020-02-16 DIAGNOSIS — T45615A Adverse effect of thrombolytic drugs, initial encounter: Secondary | ICD-10-CM | POA: Diagnosis not present

## 2020-02-16 DIAGNOSIS — F0390 Unspecified dementia without behavioral disturbance: Secondary | ICD-10-CM | POA: Diagnosis present

## 2020-02-16 DIAGNOSIS — Z87891 Personal history of nicotine dependence: Secondary | ICD-10-CM

## 2020-02-16 DIAGNOSIS — G8191 Hemiplegia, unspecified affecting right dominant side: Secondary | ICD-10-CM | POA: Diagnosis present

## 2020-02-16 DIAGNOSIS — E46 Unspecified protein-calorie malnutrition: Secondary | ICD-10-CM | POA: Diagnosis not present

## 2020-02-16 DIAGNOSIS — G894 Chronic pain syndrome: Secondary | ICD-10-CM | POA: Diagnosis not present

## 2020-02-16 DIAGNOSIS — R29715 NIHSS score 15: Secondary | ICD-10-CM | POA: Diagnosis present

## 2020-02-16 DIAGNOSIS — M549 Dorsalgia, unspecified: Secondary | ICD-10-CM | POA: Diagnosis present

## 2020-02-16 DIAGNOSIS — Z96652 Presence of left artificial knee joint: Secondary | ICD-10-CM | POA: Diagnosis present

## 2020-02-16 DIAGNOSIS — E8809 Other disorders of plasma-protein metabolism, not elsewhere classified: Secondary | ICD-10-CM | POA: Diagnosis not present

## 2020-02-16 DIAGNOSIS — I69391 Dysphagia following cerebral infarction: Secondary | ICD-10-CM | POA: Diagnosis not present

## 2020-02-16 DIAGNOSIS — D539 Nutritional anemia, unspecified: Secondary | ICD-10-CM | POA: Diagnosis not present

## 2020-02-16 DIAGNOSIS — I69322 Dysarthria following cerebral infarction: Secondary | ICD-10-CM | POA: Diagnosis not present

## 2020-02-16 DIAGNOSIS — R200 Anesthesia of skin: Secondary | ICD-10-CM | POA: Diagnosis present

## 2020-02-16 DIAGNOSIS — Z79890 Hormone replacement therapy (postmenopausal): Secondary | ICD-10-CM | POA: Diagnosis not present

## 2020-02-16 DIAGNOSIS — I5181 Takotsubo syndrome: Secondary | ICD-10-CM | POA: Diagnosis present

## 2020-02-16 DIAGNOSIS — N179 Acute kidney failure, unspecified: Secondary | ICD-10-CM | POA: Diagnosis not present

## 2020-02-16 DIAGNOSIS — E876 Hypokalemia: Secondary | ICD-10-CM | POA: Diagnosis not present

## 2020-02-16 DIAGNOSIS — Z9989 Dependence on other enabling machines and devices: Secondary | ICD-10-CM | POA: Diagnosis not present

## 2020-02-16 DIAGNOSIS — Y92230 Patient room in hospital as the place of occurrence of the external cause: Secondary | ICD-10-CM | POA: Diagnosis not present

## 2020-02-16 DIAGNOSIS — R0989 Other specified symptoms and signs involving the circulatory and respiratory systems: Secondary | ICD-10-CM | POA: Diagnosis not present

## 2020-02-16 DIAGNOSIS — I6300 Cerebral infarction due to thrombosis of unspecified precerebral artery: Secondary | ICD-10-CM | POA: Diagnosis not present

## 2020-02-16 DIAGNOSIS — Z79899 Other long term (current) drug therapy: Secondary | ICD-10-CM | POA: Diagnosis not present

## 2020-02-16 DIAGNOSIS — Z20822 Contact with and (suspected) exposure to covid-19: Secondary | ICD-10-CM | POA: Diagnosis present

## 2020-02-16 DIAGNOSIS — Z885 Allergy status to narcotic agent status: Secondary | ICD-10-CM

## 2020-02-16 DIAGNOSIS — K5901 Slow transit constipation: Secondary | ICD-10-CM | POA: Diagnosis not present

## 2020-02-16 DIAGNOSIS — E785 Hyperlipidemia, unspecified: Secondary | ICD-10-CM | POA: Diagnosis present

## 2020-02-16 DIAGNOSIS — I952 Hypotension due to drugs: Secondary | ICD-10-CM | POA: Diagnosis not present

## 2020-02-16 DIAGNOSIS — I6340 Cerebral infarction due to embolism of unspecified cerebral artery: Principal | ICD-10-CM | POA: Diagnosis present

## 2020-02-16 DIAGNOSIS — T7840XA Allergy, unspecified, initial encounter: Secondary | ICD-10-CM | POA: Diagnosis not present

## 2020-02-16 DIAGNOSIS — I69351 Hemiplegia and hemiparesis following cerebral infarction affecting right dominant side: Secondary | ICD-10-CM | POA: Diagnosis present

## 2020-02-16 DIAGNOSIS — D72829 Elevated white blood cell count, unspecified: Secondary | ICD-10-CM | POA: Diagnosis present

## 2020-02-16 DIAGNOSIS — Z888 Allergy status to other drugs, medicaments and biological substances status: Secondary | ICD-10-CM | POA: Diagnosis not present

## 2020-02-16 DIAGNOSIS — I69392 Facial weakness following cerebral infarction: Secondary | ICD-10-CM | POA: Diagnosis not present

## 2020-02-16 DIAGNOSIS — I639 Cerebral infarction, unspecified: Secondary | ICD-10-CM | POA: Diagnosis present

## 2020-02-16 DIAGNOSIS — N182 Chronic kidney disease, stage 2 (mild): Secondary | ICD-10-CM | POA: Diagnosis not present

## 2020-02-16 DIAGNOSIS — R1310 Dysphagia, unspecified: Secondary | ICD-10-CM | POA: Diagnosis present

## 2020-02-16 DIAGNOSIS — E441 Mild protein-calorie malnutrition: Secondary | ICD-10-CM | POA: Diagnosis present

## 2020-02-16 DIAGNOSIS — I6312 Cerebral infarction due to embolism of basilar artery: Secondary | ICD-10-CM | POA: Diagnosis not present

## 2020-02-16 DIAGNOSIS — Z8679 Personal history of other diseases of the circulatory system: Secondary | ICD-10-CM | POA: Diagnosis not present

## 2020-02-16 DIAGNOSIS — F329 Major depressive disorder, single episode, unspecified: Secondary | ICD-10-CM | POA: Diagnosis present

## 2020-02-16 LAB — I-STAT CHEM 8, ED
BUN: 11 mg/dL (ref 8–23)
Calcium, Ion: 1.17 mmol/L (ref 1.15–1.40)
Chloride: 103 mmol/L (ref 98–111)
Creatinine, Ser: 0.8 mg/dL (ref 0.44–1.00)
Glucose, Bld: 128 mg/dL — ABNORMAL HIGH (ref 70–99)
HCT: 40 % (ref 36.0–46.0)
Hemoglobin: 13.6 g/dL (ref 12.0–15.0)
Potassium: 3.5 mmol/L (ref 3.5–5.1)
Sodium: 145 mmol/L (ref 135–145)
TCO2: 26 mmol/L (ref 22–32)

## 2020-02-16 LAB — COMPREHENSIVE METABOLIC PANEL
ALT: 14 U/L (ref 0–44)
AST: 21 U/L (ref 15–41)
Albumin: 3.8 g/dL (ref 3.5–5.0)
Alkaline Phosphatase: 58 U/L (ref 38–126)
Anion gap: 11 (ref 5–15)
BUN: 8 mg/dL (ref 8–23)
CO2: 26 mmol/L (ref 22–32)
Calcium: 9.6 mg/dL (ref 8.9–10.3)
Chloride: 104 mmol/L (ref 98–111)
Creatinine, Ser: 0.88 mg/dL (ref 0.44–1.00)
GFR calc Af Amer: >60 mL/min (ref >60)
GFR calc non Af Amer: >60 mL/min (ref >60)
Glucose, Bld: 133 mg/dL — ABNORMAL HIGH (ref 70–99)
Potassium: 3.5 mmol/L (ref 3.5–5.1)
Sodium: 141 mmol/L (ref 135–145)
Total Bilirubin: 1 mg/dL (ref 0.3–1.2)
Total Protein: 7.3 g/dL (ref 6.5–8.1)

## 2020-02-16 LAB — CBC
HCT: 39.3 % (ref 36.0–46.0)
Hemoglobin: 12.6 g/dL (ref 12.0–15.0)
MCH: 32.1 pg (ref 26.0–34.0)
MCHC: 32.1 g/dL (ref 30.0–36.0)
MCV: 100.3 fL — ABNORMAL HIGH (ref 80.0–100.0)
Platelets: 166 10*3/uL (ref 150–400)
RBC: 3.92 MIL/uL (ref 3.87–5.11)
RDW: 12.5 % (ref 11.5–15.5)
WBC: 11.6 10*3/uL — ABNORMAL HIGH (ref 4.0–10.5)
nRBC: 0 % (ref 0.0–0.2)

## 2020-02-16 LAB — DIFFERENTIAL
Abs Immature Granulocytes: 0.05 10*3/uL (ref 0.00–0.07)
Basophils Absolute: 0 10*3/uL (ref 0.0–0.1)
Basophils Relative: 0 %
Eosinophils Absolute: 0 10*3/uL (ref 0.0–0.5)
Eosinophils Relative: 0 %
Immature Granulocytes: 0 %
Lymphocytes Relative: 8 %
Lymphs Abs: 0.9 10*3/uL (ref 0.7–4.0)
Monocytes Absolute: 0.6 10*3/uL (ref 0.1–1.0)
Monocytes Relative: 5 %
Neutro Abs: 9.9 10*3/uL — ABNORMAL HIGH (ref 1.7–7.7)
Neutrophils Relative %: 87 %

## 2020-02-16 LAB — PROTIME-INR
INR: 1 (ref 0.8–1.2)
Prothrombin Time: 13.1 seconds (ref 11.4–15.2)

## 2020-02-16 LAB — APTT: aPTT: 23 seconds — ABNORMAL LOW (ref 24–36)

## 2020-02-16 LAB — CBG MONITORING, ED: Glucose-Capillary: 106 mg/dL — ABNORMAL HIGH (ref 70–99)

## 2020-02-16 LAB — SARS CORONAVIRUS 2 BY RT PCR (HOSPITAL ORDER, PERFORMED IN ~~LOC~~ HOSPITAL LAB): SARS Coronavirus 2: NEGATIVE

## 2020-02-16 MED ORDER — SODIUM CHLORIDE 0.9 % IV SOLN: INTRAVENOUS | Status: DC

## 2020-02-16 MED ORDER — SODIUM CHLORIDE 0.9% FLUSH
3.0000 mL | Freq: Once | INTRAVENOUS | Status: AC
Start: 2020-02-16 — End: 2020-02-16
  Administered 2020-02-16: 3 mL via INTRAVENOUS

## 2020-02-16 MED ORDER — METHYLPREDNISOLONE SODIUM SUCC 125 MG IJ SOLR
125.0000 mg | INTRAMUSCULAR | Status: AC
  Administered 2020-02-16: 125 mg via INTRAVENOUS
  Filled 2020-02-16: qty 2

## 2020-02-16 MED ORDER — FAMOTIDINE IN NACL 20-0.9 MG/50ML-% IV SOLN
20.0000 mg | INTRAVENOUS | Status: AC
  Administered 2020-02-16: 20 mg via INTRAVENOUS
  Filled 2020-02-16: qty 50

## 2020-02-16 MED ORDER — ACETAMINOPHEN 160 MG/5ML PO SOLN: 650.0000 mg | ORAL | Status: DC | PRN

## 2020-02-16 MED ORDER — DIPHENHYDRAMINE HCL 50 MG/ML IJ SOLN
50.0000 mg | INTRAMUSCULAR | Status: AC
  Administered 2020-02-16: 50 mg via INTRAVENOUS

## 2020-02-16 MED ORDER — SENNOSIDES-DOCUSATE SODIUM 8.6-50 MG PO TABS: 1.0000 | ORAL_TABLET | Freq: Every evening | ORAL | Status: DC | PRN

## 2020-02-16 MED ORDER — SODIUM CHLORIDE 0.9 % IV SOLN
50.0000 mL | Freq: Once | INTRAVENOUS | Status: AC
  Administered 2020-02-16: 50 mL via INTRAVENOUS

## 2020-02-16 MED ORDER — CHLORHEXIDINE GLUCONATE CLOTH 2 % EX PADS
6.0000 | MEDICATED_PAD | Freq: Every day | CUTANEOUS | Status: DC
  Administered 2020-02-17 – 2020-02-21 (×4): 6 via TOPICAL

## 2020-02-16 MED ORDER — STROKE: EARLY STAGES OF RECOVERY BOOK
Freq: Once | Status: AC
  Filled 2020-02-16: qty 1

## 2020-02-16 MED ORDER — ACETAMINOPHEN 650 MG RE SUPP: 650.0000 mg | RECTAL | Status: DC | PRN

## 2020-02-16 MED ORDER — IOHEXOL 350 MG/ML SOLN
75.0000 mL | Freq: Once | INTRAVENOUS | Status: AC | PRN
  Administered 2020-02-16: 75 mL via INTRAVENOUS

## 2020-02-16 MED ORDER — DIPHENHYDRAMINE HCL 50 MG/ML IJ SOLN
INTRAMUSCULAR | Status: AC
  Filled 2020-02-16: qty 1

## 2020-02-16 MED ORDER — ALTEPLASE (STROKE) FULL DOSE INFUSION
0.9000 mg/kg | Freq: Once | INTRAVENOUS | Status: AC
  Administered 2020-02-16: 79.4 mg via INTRAVENOUS
  Filled 2020-02-16: qty 100

## 2020-02-16 MED ORDER — PANTOPRAZOLE SODIUM 40 MG IV SOLR
40.0000 mg | Freq: Every day | INTRAVENOUS | Status: DC
  Administered 2020-02-17: 40 mg via INTRAVENOUS
  Filled 2020-02-16: qty 40

## 2020-02-16 MED ORDER — ACETAMINOPHEN 325 MG PO TABS
650.0000 mg | ORAL_TABLET | ORAL | Status: DC | PRN
  Administered 2020-02-18 – 2020-02-19 (×3): 650 mg via ORAL
  Filled 2020-02-16 (×3): qty 2

## 2020-02-16 MED ORDER — SODIUM CHLORIDE 0.9 % IV SOLN: Freq: Once | INTRAVENOUS | Status: AC

## 2020-02-16 NOTE — ED Triage Notes (Signed)
Pt from home where she lives with her niece. Per EMS, niece left around 1730 and pt was normal; son spoke to pt on the phone around 1830 and noticed slurred speeech. On ems arrival, Pt slumped to the R side, L facial droop an d R sided gaze. EMS placed 20g IV to L wrist

## 2020-02-16 NOTE — ED Provider Notes (Signed)
[facility] EMERGENCY DEPARTMENT Provider Note   CSN: [id number] Arrival date & time: 02/16/20  2120  An emergency department physician performed an initial assessment on this suspected stroke patient at 2121.  History Chief Complaint  Patient presents with  . Code Stroke    DAEJAH KLEBBA is a 79 y.o. female.  Patient with left side facial droop that started about 3-4 hours ago, slurred speech. Patient with no history of stroke, arrives as a code stroke. Right side gaze.  The history is provided by the patient.  Neurologic Problem This is a new problem. Episode onset: about 3-4 hours ago. The problem occurs constantly. The problem has not changed since onset.Pertinent negatives include no chest pain, no abdominal pain, no headaches and no shortness of breath. Nothing aggravates the symptoms. She has tried nothing for the symptoms. The treatment provided no relief.       Past Medical History:  Diagnosis Date  . Arthritis   . Asthma   . Chronic back pain   . Depression   . History of pulmonary embolus (PE)   . Hx-TIA (transient ischemic attack)   . Hyperlipemia   . Hypertension   . Personal history of DVT (deep vein thrombosis)   . Scoliosis   . Sleep apnea    cpap - settingsi at 3   . 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 Hx-TIA (transient ischemic attack)   . Hyperlipemia   . Hypertension   . Sleep apnea   . History of pulmonary embolus (PE)   . NSTEMI (non-ST elevated myocardial infarction) (HCC) 09/16/2019  . 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 Depression 07/29/2018  . Acute pulmonary embolism (HCC) 09/29/2016  . Acute encephalopathy 09/28/2016  . Left knee pain 09/28/2016  . Left leg swelling 09/28/2016  . Hilar mass 09/28/2016  . OSA on CPAP 09/28/2016  . HTN (hypertension) 09/28/2016  . HLD (hyperlipidemia) 09/28/2016  . Chronic pain 09/28/2016  . Cellulitis of left leg  06/14/2016  . Osteoarthritis of left knee 05/17/2016  . 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 ARTHROSCOPIC REPAIR ACL  02/03/2004  . LEFT HEART CATH AND CORONARY ANGIOGRAPHY N/A 09/16/2019   Procedure: LEFT HEART CATH AND CORONARY ANGIOGRAPHY;  Surgeon: Yvonne Kendall, MD;  Location: [facility] INVASIVE CV LAB;  Service: Cardiovascular;  Laterality: N/A;  . OOPHORECTOMY  1970  . SPINE SURGERY  03/31/2006  . TOTAL KNEE ARTHROPLASTY Left 05/17/2016   Procedure: LEFT TOTAL KNEE ARTHROPLASTY;  Surgeon: Kathryne Hitch, MD;  Location: [facility] ORS;  Service: Orthopedics;  Laterality: Left;  Adductor Block; Spinal to General  . TUBAL LIGATION  1983     OB History   No obstetric history on file.     Family History  Problem Relation Age of Onset  . Other Father        kidney problems    Social History   Tobacco Use  . Smoking status: Former Smoker    Packs/day: 1.00    Years: 10.00    Pack years: 10.00    Types: Cigarettes    Quit date: 08/12/1973    Years since quitting: 46.5  . Smokeless tobacco: Never Used  Substance Use Topics  . Alcohol use: No  . Drug use: No    Home Medications Prior to Admission medications   Medication Sig Start Date End Date Taking? Authorizing  Provider  amLODipine (NORVASC) 10 MG tablet Take 10 mg by mouth daily.    [provider]  aspirin EC 81 MG EC tablet Take 1 tablet (81 mg total) by mouth daily. 09/18/19   Duke, Roe Rutherford, PA  atorvastatin (LIPITOR) 20 MG tablet Take 20 mg by mouth at bedtime.     [provider]  busPIRone (BUSPAR) 30 MG tablet Take 1 tablet (30 mg total) by mouth in the morning and at bedtime. 01/11/20   Cherie Ouch, PA-C  cholecalciferol (VITAMIN D3) 25 MCG (1000 UT) tablet Take 1,000 Units by mouth daily.    [provider]  donepezil (ARICEPT) 10 MG tablet Take 10 mg by mouth at bedtime.     [provider]  furosemide  (LASIX) 40 MG tablet Take 40 mg by mouth as needed for edema. PRN    [provider]  hydrochlorothiazide (HYDRODIURIL) 25 MG tablet Take 25 mg by mouth daily. 10/18/19   [provider]  linaclotide (LINZESS) 145 MCG CAPS capsule Take 145 mcg by mouth as needed. constipation    [provider]  lisinopril (PRINIVIL,ZESTRIL) 40 MG tablet Take 40 mg by mouth daily.     [provider]  memantine (NAMENDA XR) 28 MG CP24 24 hr capsule Take 28 mg by mouth daily.  04/28/17   [provider]  mirtazapine (REMERON) 30 MG tablet Take 15 mg by mouth at bedtime. 1/4-1/2 prn sleep    [provider]  Multiple Vitamins-Minerals (MULTI-VITAMIN GUMMIES PO) Take by mouth.    [provider]  nitroGLYCERIN (NITROSTAT) 0.4 MG SL tablet Place 1 tablet (0.4 mg total) under the tongue every 5 (five) minutes as needed for chest pain. 09/17/19   Duke, Roe Rutherford, PA  omeprazole (PRILOSEC) 20 MG capsule Take 20 mg by mouth daily. 02/01/20   [provider]  oxyCODONE (ROXICODONE) 15 MG immediate release tablet Take 7.5 mg by mouth 4 (four) times daily as needed for pain.  09/30/18   [provider]  potassium chloride (KLOR-CON) 10 MEQ tablet Take 10 mEq by mouth daily. 02/01/20   [provider]  RESTASIS 0.05 % ophthalmic emulsion Place 1 drop into both eyes daily as needed (dry eyes).  12/15/18   [provider]  zinc gluconate 50 MG tablet Take 25 mg by mouth daily.    [provider]    Allergies    Codeine  Review of Systems   Review of Systems  Constitutional: Negative for chills and fever.  HENT: Negative for ear pain and sore throat.   Eyes: Negative for pain and visual disturbance.  Respiratory: Negative for cough and shortness of breath.   Cardiovascular: Negative for chest pain and palpitations.  Gastrointestinal: Negative for abdominal pain and vomiting.  Genitourinary: Negative for dysuria and  hematuria.  Musculoskeletal: Negative for arthralgias and back pain.  Skin: Negative for color change and rash.  Neurological: Positive for facial asymmetry, speech difficulty, weakness and numbness. Negative for seizures, syncope and headaches.  All other systems reviewed and are negative.   Physical Exam Updated Vital Signs BP (!) 141/74   Pulse 71   Temp 97.6 F (36.4 C) (Oral)   Resp 18   Wt 88.2 kg   SpO2 97%   BMI 35.56 kg/m   Physical Exam Vitals and nursing note reviewed.  Constitutional:      General: She is not in acute distress.    Appearance: She is well-developed. She is not  ill-appearing.  HENT:     Head: Normocephalic and atraumatic.     Nose: Nose normal.     Mouth/Throat:     Mouth: Mucous membranes are moist.  Eyes:     Extraocular Movements: Extraocular movements intact.     Conjunctiva/sclera: Conjunctivae normal.     Pupils: Pupils are equal, round, and reactive to light.     Comments: Right side gaze   Cardiovascular:     Rate and Rhythm: Normal rate and regular rhythm.     Pulses: Normal pulses.     Heart sounds: Normal heart sounds. No murmur heard.   Pulmonary:     Effort: Pulmonary effort is normal. No respiratory distress.     Breath sounds: Normal breath sounds.  Abdominal:     General: Abdomen is flat.     Palpations: Abdomen is soft.     Tenderness: There is no abdominal tenderness.  Musculoskeletal:     Cervical back: Neck supple.  Skin:    General: Skin is warm and dry.     Capillary Refill: Capillary refill takes less than 2 seconds.  Neurological:     Mental Status: She is alert.     Sensory: Sensory deficit present.     Motor: Weakness present.     Coordination: Coordination abnormal.     Comments: 3+/5 strength to b/l LE, 4+/5 RUE, 5+/5 LUE, decreased sensation to the left side, left sided facial droop   Psychiatric:        Mood and Affect: Mood normal.     ED Results / Procedures / Treatments   Labs (all labs  ordered are listed, but only abnormal results are displayed) Labs Reviewed  APTT - Abnormal; Notable for the following components:      Result Value   aPTT 23 (*)    All other components within normal limits  CBC - Abnormal; Notable for the following components:   WBC 11.6 (*)    MCV 100.3 (*)    All other components within normal limits  DIFFERENTIAL - Abnormal; Notable for the following components:   Neutro Abs 9.9 (*)    All other components within normal limits  I-STAT CHEM 8, ED - Abnormal; Notable for the following components:   Glucose, Bld 128 (*)    All other components within normal limits  CBG MONITORING, ED - Abnormal; Notable for the following components:   Glucose-Capillary 106 (*)    All other components within normal limits  SARS CORONAVIRUS 2 BY RT PCR (HOSPITAL ORDER, PERFORMED IN [facility])  PROTIME-INR  COMPREHENSIVE METABOLIC PANEL    EKG None  Radiology CT Code Stroke CTA Head W/WO contrast  Result Date: 02/16/2020 CLINICAL DATA:  Right facial droop EXAM: CT ANGIOGRAPHY HEAD AND NECK TECHNIQUE: Multidetector CT imaging of the head and neck was performed using the standard protocol during bolus administration of intravenous contrast. Multiplanar CT image reconstructions and MIPs were obtained to evaluate the vascular anatomy. Carotid stenosis measurements (when applicable) are obtained utilizing NASCET criteria, using the distal internal carotid diameter as the denominator. CONTRAST:  75 mL Omnipaque 350 COMPARISON:  None. FINDINGS: CTA NECK Aortic arch: Great vessel origins are patent. Right carotid system: Patent. Minimal calcified plaque at the ICA origin without measurable stenosis. Mild tortuosity of the distal cervical ICA. Left carotid system: Patent. Trace calcified plaque at the ICA origin without measurable stenosis. Mild tortuosity of the distal cervical ICA. Vertebral arteries: Patent.  No measurable stenosis. Skeleton: Cervical spine  degenerative changes. Other neck: No mass or adenopathy. Upper chest: No apical lung mass. Dilatation of the main pulmonary artery reflecting pulmonary arterial hypertension. Review of the MIP images confirms the above findings CTA HEAD Anterior circulation: Intracranial internal carotid arteries are patent with mild atherosclerotic irregularity. Anterior and middle cerebral arteries are patent. Posterior circulation: Intracranial vertebral arteries, basilar artery, and posterior cerebral arteries are patent. PICA, AICA, and superior cerebellar artery origins are patent. There are patent bilateral posterior communicating arteries. Venous sinuses: Patent as allowed by contrast bolus timing. Review of the MIP images confirms the above findings IMPRESSION: No large vessel occlusion or hemodynamically significant stenosis. Electronically Signed   By: Guadlupe Spanish M.D.   On: 02/16/2020 22:02   CT Code Stroke CTA Neck W/WO contrast  Result Date: 02/16/2020 CLINICAL DATA:  Right facial droop EXAM: CT ANGIOGRAPHY HEAD AND NECK TECHNIQUE: Multidetector CT imaging of the head and neck was performed using the standard protocol during bolus administration of intravenous contrast. Multiplanar CT image reconstructions and MIPs were obtained to evaluate the vascular anatomy. Carotid stenosis measurements (when applicable) are obtained utilizing NASCET criteria, using the distal internal carotid diameter as the denominator. CONTRAST:  75 mL Omnipaque 350 COMPARISON:  None. FINDINGS: CTA NECK Aortic arch: Great vessel origins are patent. Right carotid system: Patent. Minimal calcified plaque at the ICA origin without measurable stenosis. Mild tortuosity of the distal cervical ICA. Left carotid system: Patent. Trace calcified plaque at the ICA origin without measurable stenosis. Mild tortuosity of the distal cervical ICA. Vertebral arteries: Patent.  No measurable stenosis. Skeleton: Cervical spine degenerative changes. Other  neck: No mass or adenopathy. Upper chest: No apical lung mass. Dilatation of the main pulmonary artery reflecting pulmonary arterial hypertension. Review of the MIP images confirms the above findings CTA HEAD Anterior circulation: Intracranial internal carotid arteries are patent with mild atherosclerotic irregularity. Anterior and middle cerebral arteries are patent. Posterior circulation: Intracranial vertebral arteries, basilar artery, and posterior cerebral arteries are patent. PICA, AICA, and superior cerebellar artery origins are patent. There are patent bilateral posterior communicating arteries. Venous sinuses: Patent as allowed by contrast bolus timing. Review of the MIP images confirms the above findings IMPRESSION: No large vessel occlusion or hemodynamically significant stenosis. Electronically Signed   By: Guadlupe Spanish M.D.   On: 02/16/2020 22:02   CT HEAD CODE STROKE WO CONTRAST  Result Date: 02/16/2020 CLINICAL DATA:  Code stroke.  Right facial droop EXAM: CT HEAD WITHOUT CONTRAST TECHNIQUE: Contiguous axial images were obtained from the base of the skull through the vertex without intravenous contrast. COMPARISON:  2018 FINDINGS: Brain: There is no acute intracranial hemorrhage, mass effect, or edema. No new loss of gray-white differentiation. There is an age-indeterminate small infarct of the superior left cerebellum. Ventricles and sulci are normal in size and configuration. Patchy hypoattenuation in the supratentorial white matter is nonspecific but may reflect mild chronic microvascular ischemic changes. There is no extra-axial fluid collection Vascular: There is no hyperdense vessel. Mild intracranial atherosclerotic calcification at the skull base. Skull: Unremarkable. Sinuses/Orbits: No acute abnormality. Other: Mastoid air cells are clear. ASPECTS (Alberta Stroke Program Early CT Score) - Ganglionic level infarction (caudate, lentiform nuclei, internal capsule, insula, M1-M3 cortex): 7 -  Supraganglionic infarction (M4-M6 cortex): 3 Total score (0-10 with 10 being normal): 10 IMPRESSION: There is no acute intracranial hemorrhage or evidence of definite acute infarction. ASPECT score is 10. New age-indeterminate small infarct of the superior left cerebellum. These results were communicated to Dr. Laurence Slate  at 9:50 pmon 7/7/2021by text page via the [facility] messaging system. Electronically Signed   By: Guadlupe Spanish M.D.   On: 02/16/2020 21:57    Procedures .Critical Care Performed by: Virgina Norfolk, DO Authorized by: Virgina Norfolk, DO   Critical care provider statement:    Critical care time (minutes):  35   Critical care was necessary to treat or prevent imminent or life-threatening deterioration of the following conditions:  CNS failure or compromise   Critical care was time spent personally by me on the following activities:  Blood draw for specimens, development of treatment plan with patient or surrogate, discussions with primary provider, evaluation of patient's response to treatment, examination of patient, obtaining history from patient or surrogate, ordering and performing treatments and interventions, ordering and review of laboratory studies, ordering and review of radiographic studies, pulse oximetry, re-evaluation of patient's condition and review of old charts   I assumed direction of critical care for this patient from another provider in my specialty: no     (including critical care time)  Medications Ordered in ED Medications  sodium chloride flush (NS) 0.9 % injection 3 mL (has no administration in time range)  alteplase (ACTIVASE) 1 mg/mL infusion 79.4 mg (79.4 mg Intravenous New Bag/Given 02/16/20 2142)    Followed by  0.9 %  sodium chloride infusion (has no administration in time range)  iohexol (OMNIPAQUE) 350 MG/ML injection 75 mL (75 mLs Intravenous Contrast Given 02/16/20 2147)    ED Course  I have reviewed the triage vital signs and the nursing  notes.  Pertinent labs & imaging results that were available during my care of the patient were reviewed by me and considered in my medical decision making (see chart for details).    MDM Rules/Calculators/A&P                          KWANESHA PUGLISI is a 79 year old female with history of hypertension, high cholesterol presents as a code stroke.  Patient with left-sided facial droop, left-sided numbness, slurred speech, right-sided gaze.  CT scan negative for head bleed.  Patient given TPA as symptoms started within 4 hours ago.  CTA and perfusion studies being done to see if patient is a thrombectomy candidate.  To be admitted to the neurological ICU given that patient has received TPA.  Hemodynamically stable at this time.  Patient not a thrombectomy candidate.  Given TPA.  Vital signs are stable.  Admitted to the neurological ICU.  This chart was dictated using voice recognition software.  Despite best efforts to proofread,  errors can occur which can change the documentation meaning.     Final Clinical Impression(s) / ED Diagnoses Final diagnoses:  Cerebrovascular accident (CVA), unspecified mechanism [facility])    Rx / DC Orders ED Discharge Orders    None       Virgina Norfolk, DO 02/16/20 2219

## 2020-02-16 NOTE — H&P (Signed)
Chief Complaint:  Left facial droop, double vision, slurred speech, right side weakness  History obtained from: Patient and Chart    HPI:                                                                                                                                       [name] is a 79 y.o. female with past medical history significant for hypertension, hyperlipidemia, DVT, TIA, PE, Takotsubo cardiomyopathy, obstructive sleep apnea presents to emergency department as a code stroke for sudden onset slurred speech, double vision.  History obtained by the patient and her son over the phone.  She was last normal around 6:30 PM when she spoke with the son and somewhere between 7 and 8 PM patient's speech became significantly slurred.  EMS was called and they noted patient had forced gaze deviation to the right, left facial droop and brought in as a code stroke.  Blood pressure was 140 systolic.  initial NIH stroke scale was 13 (patient had difficulty raising both legs), stat CT head was obtained which showed no hemorrhage.  Aspects 10 out of 10.  CT angiogram was obtained to rule out large vessel occlusion, with anterior circulation as well as PCA, vertebrals and basilar were open.  Patient received IV TPA after discussing risk versus benefit.  She subsequently worsened after IV TPA with worsening right-sided weakness, improved after laying head of bed flat and giving IV fluids.  Denied headache at the time.  Date last known well: 7.7.21 Time last known well: 6.30 pm tPA Given: yes NIHSS: 13 Baseline MRS 0  Past Medical History:  Diagnosis Date  . Arthritis   . Asthma   . Chronic back pain   . Depression   . History of pulmonary embolus (PE)   . Hx-TIA (transient ischemic attack)   . Hyperlipemia   . Hypertension   . Personal history of DVT (deep vein thrombosis)   . Scoliosis   . Sleep apnea    cpap - settingsi at 2   . 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    Past Surgical  History:  Procedure Laterality Date  . ARTHROSCOPIC REPAIR ACL  02/03/2004  . LEFT HEART CATH AND CORONARY ANGIOGRAPHY N/A 09/16/2019   Procedure: LEFT HEART CATH AND CORONARY ANGIOGRAPHY;  Surgeon: Yvonne Kendall, MD;  Location: [facility];  Service: Cardiovascular;  Laterality: N/A;  . OOPHORECTOMY  1970  . SPINE SURGERY  03/31/2006  . TOTAL KNEE ARTHROPLASTY Left 05/17/2016   Procedure: LEFT TOTAL KNEE ARTHROPLASTY;  Surgeon: Kathryne Hitch, MD;  Location: [facility];  Service: Orthopedics;  Laterality: Left;  Adductor Block; Spinal to General  . TUBAL LIGATION  1983    Family History  Problem Relation Age of Onset  . Other Father        kidney problems   Social History:  reports that she quit smoking about  46 years ago. Her smoking use included cigarettes. She has a 10.00 pack-year smoking history. She has never used smokeless tobacco. She reports that she does not drink alcohol and does not use drugs.  Allergies:  Allergies  Allergen Reactions  . Codeine Itching    Medications:                                                                                                                        I reviewed home medications   ROS:                                                                                                                                     14 systems reviewed and negative except above    Examination:                                                                                                      General: Appears well-developed  Psych: Affect appropriate to situation Eyes: No scleral injection HENT: No OP obstrucion Head: Normocephalic.  Cardiovascular: Normal rate and regular rhythm.  Respiratory: Effort normal and breath sounds normal to anterior ascultation GI: Soft.  No distension. There is no tenderness.  Skin: WDI    Neurological Examination Mental Status: Alert, oriented, thought content appropriate.  Dysarthria.  Speech  fluent without evidence of aphasia. Able to follow 3 step commands without difficulty. Cranial Nerves: II: Visual fields: Possible right homonymous hemianopsia III,IV, VI: ptosis not present, disconjugate gaze with forced abduction of right eye, pupils equal, round, reactive to light and accommodation V,VII: Left facial droop VIII: hearing normal bilaterally IX,X: uvula rises symmetrically XI: bilateral shoulder shrug XII: midline tongue extension Motor: Right : Upper extremity   4/5    Left:     Upper extremity   5/5  Lower extremity   5/5     Lower extremity   5/5 Tone and bulk:normal tone throughout; no atrophy noted Sensory: Pinprick and light touch intact throughout, bilaterally  Deep Tendon Reflexes: 2+ and symmetric throughout Plantars: Right: downgoing   Left: downgoing Cerebellar: normal finger-to-nose      Lab Results: Basic Metabolic Panel: Recent Labs  Lab 02/16/20 2142 02/16/20 2143  NA 141 145  K 3.5 3.5  CL 104 103  CO2 26  --   GLUCOSE 133* 128*  BUN 8 11  CREATININE 0.88 0.80  CALCIUM 9.6  --     CBC: Recent Labs  Lab 02/16/20 2142 02/16/20 2143  WBC 11.6*  --   NEUTROABS 9.9*  --   HGB 12.6 13.6  HCT 39.3 40.0  MCV 100.3*  --   PLT 166  --     Coagulation Studies: Recent Labs    02/16/20 Dec 19, 2140  LABPROT 13.1  INR 1.0    Imaging: CT Code Stroke CTA Head W/WO contrast  Result Date: 02/16/2020 CLINICAL DATA:  Right facial droop EXAM: CT ANGIOGRAPHY HEAD AND NECK TECHNIQUE: Multidetector CT imaging of the head and neck was performed using the standard protocol during bolus administration of intravenous contrast. Multiplanar CT image reconstructions and MIPs were obtained to evaluate the vascular anatomy. Carotid stenosis measurements (when applicable) are obtained utilizing NASCET criteria, using the distal internal carotid diameter as the denominator. CONTRAST:  75 mL Omnipaque 350 COMPARISON:  None. FINDINGS: CTA NECK Aortic arch: Great  vessel origins are patent. Right carotid system: Patent. Minimal calcified plaque at the ICA origin without measurable stenosis. Mild tortuosity of the distal cervical ICA. Left carotid system: Patent. Trace calcified plaque at the ICA origin without measurable stenosis. Mild tortuosity of the distal cervical ICA. Vertebral arteries: Patent.  No measurable stenosis. Skeleton: Cervical spine degenerative changes. Other neck: No mass or adenopathy. Upper chest: No apical lung mass. Dilatation of the main pulmonary artery reflecting pulmonary arterial hypertension. Review of the MIP images confirms the above findings CTA HEAD Anterior circulation: Intracranial internal carotid arteries are patent with mild atherosclerotic irregularity. Anterior and middle cerebral arteries are patent. Posterior circulation: Intracranial vertebral arteries, basilar artery, and posterior cerebral arteries are patent. PICA, AICA, and superior cerebellar artery origins are patent. There are patent bilateral posterior communicating arteries. Venous sinuses: Patent as allowed by contrast bolus timing. Review of the MIP images confirms the above findings IMPRESSION: No large vessel occlusion or hemodynamically significant stenosis. Electronically Signed   By: Guadlupe Spanish M.D.   On: 02/16/2020 22:02   CT Code Stroke CTA Neck W/WO contrast  Result Date: 02/16/2020 CLINICAL DATA:  Right facial droop EXAM: CT ANGIOGRAPHY HEAD AND NECK TECHNIQUE: Multidetector CT imaging of the head and neck was performed using the standard protocol during bolus administration of intravenous contrast. Multiplanar CT image reconstructions and MIPs were obtained to evaluate the vascular anatomy. Carotid stenosis measurements (when applicable) are obtained utilizing NASCET criteria, using the distal internal carotid diameter as the denominator. CONTRAST:  75 mL Omnipaque 350 COMPARISON:  None. FINDINGS: CTA NECK Aortic arch: Great vessel origins are patent.  Right carotid system: Patent. Minimal calcified plaque at the ICA origin without measurable stenosis. Mild tortuosity of the distal cervical ICA. Left carotid system: Patent. Trace calcified plaque at the ICA origin without measurable stenosis. Mild tortuosity of the distal cervical ICA. Vertebral arteries: Patent.  No measurable stenosis. Skeleton: Cervical spine degenerative changes. Other neck: No mass or adenopathy. Upper chest: No apical lung mass. Dilatation of the main pulmonary artery reflecting pulmonary arterial hypertension. Review of the MIP images confirms the above findings CTA HEAD Anterior circulation: Intracranial internal carotid arteries  are patent with mild atherosclerotic irregularity. Anterior and middle cerebral arteries are patent. Posterior circulation: Intracranial vertebral arteries, basilar artery, and posterior cerebral arteries are patent. PICA, AICA, and superior cerebellar artery origins are patent. There are patent bilateral posterior communicating arteries. Venous sinuses: Patent as allowed by contrast bolus timing. Review of the MIP images confirms the above findings IMPRESSION: No large vessel occlusion or hemodynamically significant stenosis. Electronically Signed   By: Guadlupe Spanish M.D.   On: 02/16/2020 22:02   CT HEAD CODE STROKE WO CONTRAST  Result Date: 02/16/2020 CLINICAL DATA:  Code stroke.  Right facial droop EXAM: CT HEAD WITHOUT CONTRAST TECHNIQUE: Contiguous axial images were obtained from the base of the skull through the vertex without intravenous contrast. COMPARISON:  2018 FINDINGS: Brain: There is no acute intracranial hemorrhage, mass effect, or edema. No new loss of gray-white differentiation. There is an age-indeterminate small infarct of the superior left cerebellum. Ventricles and sulci are normal in size and configuration. Patchy hypoattenuation in the supratentorial white matter is nonspecific but may reflect mild chronic microvascular ischemic changes.  There is no extra-axial fluid collection Vascular: There is no hyperdense vessel. Mild intracranial atherosclerotic calcification at the skull base. Skull: Unremarkable. Sinuses/Orbits: No acute abnormality. Other: Mastoid air cells are clear. ASPECTS (Alberta Stroke Program Early CT Score) - Ganglionic level infarction (caudate, lentiform nuclei, internal capsule, insula, M1-M3 cortex): 7 - Supraganglionic infarction (M4-M6 cortex): 3 Total score (0-10 with 10 being normal): 10 IMPRESSION: There is no acute intracranial hemorrhage or evidence of definite acute infarction. ASPECT score is 10. New age-indeterminate small infarct of the superior left cerebellum. These results were communicated to Dr. Laurence Slate at 9:50 pmon 7/7/2021by text page via the [facility] messaging system. Electronically Signed   By: Guadlupe Spanish M.D.   On: 02/16/2020 21:57     ASSESSMENT AND PLAN  79 y.o. female with past medical history significant for hypertension, hyperlipidemia, DVT, TIA, PE, Takotsubo cardiomyopathy, obstructive sleep apnea presents to emergency department as a code stroke for sudden onset slurred speech, double vision. 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  CTA head and neck was performed to look for LVO which was negative.  Received IV TPA, had initial worsening of symptoms but improved after laying head of bed flat.  Patient denies headache.  Patient also developed oral lingual edema-improved after receiving prednisone and Benadryl.  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  She is at risk for significant risk of neurological worsening from cerebral edema,  death from brain herniation, heart failure, hemorrhagic conversion, infection, respiratory failure and seizure. This patient's care requires constant monitoring of vital signs, hemodynamics, respiratory and cardiac monitoring, review of multiple databases, neurological assessment, discussion with family, other specialists and medical decision making of high complexity.  I spent 55 minutes of neurocritical time in the  care of this patient.     Please page stroke NP  Or  PA  Or MD from 8am -4 pm  as this patient from this time will be  followed by the stroke.   You can look them up on [email or web address]  Password [facility]    Harrold Fitchett [facility] Pager Number [phone]

## 2020-02-16 NOTE — ED Notes (Signed)
ICU  Malachi Pro son [phone] would like an update on the pt

## 2020-02-16 NOTE — Progress Notes (Signed)
Pharmacist Code Stroke Response  Notified to mix tPA at 2138 by Dr. Laurence Slate Delivered tPA to RN at 2141 tPA administered at 2142  tPA dose = 7.9mg  bolus over 1 minute followed by 71.5mg  for a total dose of 79.4mg  over 1 hour  Issues/delays encountered (if applicable): None   Vinnie Level, PharmD., BCPS, BCCCP Clinical Pharmacist Clinical phone for 02/16/20 until 11:30pm: [phone] If after 11:30pm, please refer to [facility] for unit-specific pharmacist

## 2020-02-16 NOTE — ED Notes (Signed)
Pt arrived to 4N with swelling to lower lip that was not present on previous stroke assessment. Dr.Aroor made aware at 2325

## 2020-02-17 ENCOUNTER — Inpatient Hospital Stay (HOSPITAL_COMMUNITY): Payer: Medicare Other

## 2020-02-17 ENCOUNTER — Other Ambulatory Visit (HOSPITAL_COMMUNITY): Payer: Medicare Other

## 2020-02-17 DIAGNOSIS — I63312 Cerebral infarction due to thrombosis of left middle cerebral artery: Secondary | ICD-10-CM

## 2020-02-17 LAB — HEMOGLOBIN A1C
Hgb A1c MFr Bld: 5.5 % (ref 4.8–5.6)
Mean Plasma Glucose: 111.15 mg/dL

## 2020-02-17 LAB — LIPID PANEL
Cholesterol: 130 mg/dL (ref 0–200)
HDL: 50 mg/dL (ref >40)
LDL Cholesterol: 70 mg/dL (ref 0–99)
Total CHOL/HDL Ratio: 2.6 RATIO
Triglycerides: 51 mg/dL (ref <150)
VLDL: 10 mg/dL (ref 0–40)

## 2020-02-17 LAB — MRSA PCR SCREENING: MRSA by PCR: NEGATIVE

## 2020-02-17 MED ORDER — MEMANTINE HCL ER 28 MG PO CP24
28.0000 mg | ORAL_CAPSULE | Freq: Every day | ORAL | Status: DC
  Administered 2020-02-18 – 2020-02-21 (×4): 28 mg via ORAL
  Filled 2020-02-17 (×4): qty 1

## 2020-02-17 MED ORDER — HYDROCHLOROTHIAZIDE 25 MG PO TABS
25.0000 mg | ORAL_TABLET | Freq: Every day | ORAL | Status: DC
  Administered 2020-02-18 – 2020-02-19 (×2): 25 mg via ORAL
  Filled 2020-02-17 (×2): qty 1

## 2020-02-17 MED ORDER — BUSPIRONE HCL 10 MG PO TABS
30.0000 mg | ORAL_TABLET | Freq: Two times a day (BID) | ORAL | Status: DC
  Administered 2020-02-17 – 2020-02-21 (×8): 30 mg via ORAL
  Filled 2020-02-17 (×6): qty 3
  Filled 2020-02-17 (×2): qty 2

## 2020-02-17 MED ORDER — ATORVASTATIN CALCIUM 10 MG PO TABS
20.0000 mg | ORAL_TABLET | Freq: Every day | ORAL | Status: DC
  Administered 2020-02-17 – 2020-02-20 (×4): 20 mg via ORAL
  Filled 2020-02-17 (×4): qty 2

## 2020-02-17 MED ORDER — OXYCODONE HCL 5 MG PO TABS
15.0000 mg | ORAL_TABLET | Freq: Four times a day (QID) | ORAL | Status: DC | PRN
  Administered 2020-02-18 – 2020-02-21 (×5): 15 mg via ORAL
  Filled 2020-02-17 (×6): qty 3

## 2020-02-17 MED ORDER — DONEPEZIL HCL 10 MG PO TABS
10.0000 mg | ORAL_TABLET | Freq: Every day | ORAL | Status: DC
  Administered 2020-02-17 – 2020-02-20 (×4): 10 mg via ORAL
  Filled 2020-02-17 (×4): qty 1

## 2020-02-17 MED ORDER — CYCLOSPORINE 0.05 % OP EMUL
1.0000 [drp] | Freq: Every day | OPHTHALMIC | Status: DC | PRN
  Administered 2020-02-19: 1 [drp] via OPHTHALMIC
  Filled 2020-02-17 (×3): qty 1

## 2020-02-17 MED ORDER — AMLODIPINE BESYLATE 10 MG PO TABS
10.0000 mg | ORAL_TABLET | Freq: Every day | ORAL | Status: DC
  Administered 2020-02-18 – 2020-02-19 (×2): 10 mg via ORAL
  Filled 2020-02-17 (×2): qty 1

## 2020-02-17 MED ORDER — ASPIRIN 325 MG PO TABS
325.0000 mg | ORAL_TABLET | Freq: Every day | ORAL | Status: DC
  Administered 2020-02-18: 325 mg via ORAL
  Filled 2020-02-17: qty 1

## 2020-02-17 MED ORDER — LISINOPRIL 20 MG PO TABS
40.0000 mg | ORAL_TABLET | Freq: Every day | ORAL | Status: DC
  Administered 2020-02-18 – 2020-02-19 (×2): 40 mg via ORAL
  Filled 2020-02-17 (×2): qty 2

## 2020-02-17 MED ORDER — VITAMIN D 25 MCG (1000 UNIT) PO TABS
1000.0000 [IU] | ORAL_TABLET | Freq: Every day | ORAL | Status: DC
  Administered 2020-02-18 – 2020-02-21 (×4): 1000 [IU] via ORAL
  Filled 2020-02-17 (×4): qty 1

## 2020-02-17 NOTE — Progress Notes (Signed)
Patient more lethargic, difficult to arouse. Neuro NP made aware. STAT CTH ordered.

## 2020-02-17 NOTE — Progress Notes (Signed)
Inpatient Rehab Admissions Coordinator Note:   Per therapy recommendations, pt was screened for CIR candidacy by Estill Dooms, PT, DPT.  At this time we are recommending a CIR consult and I will place an order per our protocol.  Please contact me with questions.   Estill Dooms, PT, DPT [phone] 02/17/20 4:53 PM

## 2020-02-17 NOTE — Evaluation (Signed)
Physical Therapy Evaluation Patient Details Name: [name] MRN: [id number] DOB: 04/17/1941 Today's Date: 02/17/2020   History of Present Illness  79 y.o. female with past medical history significant for hypertension, hyperlipidemia, DVT, TIA, PE, Takotsubo cardiomyopathy, obstructive sleep apnea presents to emergency department as a code stroke for sudden onset slurred speech, double vision. CT head and CTA negative. Pt received IV tPA.  Clinical Impression  Pt presents to PT with deficits in functional mobility, gait, balance, endurance, strength, power, safety awareness, awareness of deficits, and vision. Pt with significant R lateral lean during session when sitting and standing which she is able to intermittently partially correct for. Pt requires significant physical assistance to perform all OOB mobility at this time due to weakness and instability. Pt does demonstrate dysconjugate gaze with no noticeable lateral tracking of L eye and R eye tracking to the right but not past midline to the left. Pt will benefit from continued acute PT POC to improve mobility and to reduce falls risk. PT currently recommends CIR at the time of discharge as the pt reports mobilizing at a modI level around the house prior to admission and demonstrates the potential and motivation to make significant functional gains. PT recommends use of lift for all transfers with nursing staff at this time.    Follow Up Recommendations CIR    Equipment Recommendations  Hospital bed;Other (comment) (mechanical lift if home today)    Recommendations for Other Services Rehab consult     Precautions / Restrictions Precautions Precautions: Fall Restrictions Weight Bearing Restrictions: No      Mobility  Bed Mobility Overal bed mobility: Needs Assistance Bed Mobility: Supine to Sit     Supine to sit: Min guard;HOB elevated        Transfers Overall transfer level: Needs assistance Equipment used: Rolling  walker (2 wheeled);1 person hand held assist Transfers: Sit to/from [employer] Sit to Stand: Max assist Stand pivot transfers: Max assist       General transfer comment: pt unable to safely obtain standing position with PT assistance and use of RW, PT provides R knee block and BUE support to assist pt into standing, pt with increased trunk flexion when stepping and turning to recliner  Ambulation/Gait                Stairs            Wheelchair Mobility    Modified Rankin (Stroke Patients Only)       Balance Overall balance assessment: Needs assistance Sitting-balance support: Single extremity supported;Feet supported Sitting balance-Leahy Scale: Poor Sitting balance - Comments: modA with R lateral lean Postural control: Right lateral lean Standing balance support: Bilateral upper extremity supported Standing balance-Leahy Scale: Zero Standing balance comment: maxA to maintain standing balance                             Pertinent Vitals/Pain Pain Assessment: Faces Faces Pain Scale: Hurts a little bit Pain Location: generalized Pain Descriptors / Indicators: Grimacing Pain Intervention(s): Monitored during session    Home Living Family/patient expects to be discharged to:: Private residence Living Arrangements: Other relatives (gdtr) Available Help at Discharge: Family;Available PRN/intermittently (gdtr works nights per pt) Type of Home: House Home Access: Stairs to enter Entrance Stairs-Rails: Doctor, general practice of Steps: 6 Home Layout: One level Home Equipment: Environmental consultant - 2 wheels;Wheelchair - manual      Prior Function Level of Independence: Needs assistance  Gait / Transfers Assistance Needed: pt reports ambulating with a RW for household distances  ADL's / Homemaking Assistance Needed: pt requires assistance with IADL's reports bathing and dressing independently        Hand Dominance         Extremity/Trunk Assessment   Upper Extremity Assessment Upper Extremity Assessment: Generalized weakness    Lower Extremity Assessment Lower Extremity Assessment: Generalized weakness    Cervical / Trunk Assessment Cervical / Trunk Assessment: Other exceptions;Kyphotic Cervical / Trunk Exceptions: morbid obesity  Communication   Communication:  (mild dysarthria)  Cognition Arousal/Alertness: Awake/alert (initially lethargic, arouses to verbal and tactile stimuli) Behavior During Therapy: WFL for tasks assessed/performed Overall Cognitive Status: Impaired/Different from baseline Area of Impairment: Attention;Memory;Following commands;Safety/judgement;Awareness;Problem solving                   Current Attention Level: Sustained Memory: Decreased recall of precautions;Decreased short-term memory Following Commands: Follows one step commands with increased time Safety/Judgement: Decreased awareness of safety;Decreased awareness of deficits Awareness: Intellectual Problem Solving: Slow processing;Requires verbal cues        General Comments General comments (skin integrity, edema, etc.): VSS on RA    Exercises     Assessment/Plan    PT Assessment Patient needs continued PT services  PT Problem List Decreased strength;Decreased activity tolerance;Decreased balance;Decreased mobility;Decreased knowledge of use of DME;Decreased safety awareness;Decreased knowledge of precautions       PT Treatment Interventions DME instruction;Gait training;Stair training;Functional mobility training;Therapeutic activities;Therapeutic exercise;Balance training;Neuromuscular re-education;Patient/family education;Cognitive remediation    PT Goals (Current goals can be found in the Care Plan section)  Acute Rehab PT Goals Patient Stated Goal: To go home PT Goal Formulation: With patient Time For Goal Achievement: 03/02/20 Potential to Achieve Goals: Fair    Frequency Min 4X/week    Barriers to discharge        Co-evaluation               AM-PAC PT "6 Clicks" Mobility  Outcome Measure Help needed turning from your back to your side while in a flat bed without using bedrails?: A Lot Help needed moving from lying on your back to sitting on the side of a flat bed without using bedrails?: A Little Help needed moving to and from a bed to a chair (including a wheelchair)?: Total Help needed standing up from a chair using your arms (e.g., wheelchair or bedside chair)?: Total Help needed to walk in hospital room?: Total Help needed climbing 3-5 steps with a railing? : Total 6 Click Score: 9    End of Session   Activity Tolerance: Patient tolerated treatment well Patient left: in chair;with call bell/phone within reach;with chair alarm set Nurse Communication: Mobility status;Need for lift equipment PT Visit Diagnosis: Other abnormalities of gait and mobility (R26.89);Unsteadiness on feet (R26.81);Muscle weakness (generalized) (M62.81)    Time: 5329-9242 PT Time Calculation (min) (ACUTE ONLY): 25 min   Charges:   PT Evaluation $PT Eval Moderate Complexity: 1 Mod PT Treatments $Therapeutic Activity: 8-22 mins        Arlyss Gandy, PT, DPT Acute Rehabilitation Pager: [phone]   Arlyss Gandy 02/17/2020, 3:22 PM

## 2020-02-17 NOTE — Progress Notes (Addendum)
STROKE TEAM PROGRESS NOTE   INTERVAL HISTORY I have personally reviewed history of presenting illness with the patient, electronic medical records and imaging films in PACS.  She presented with left-sided weakness and received IV TPA and obtain slight improvement.  Blood pressure has been adequately controlled.  Follow-up brain imaging is pending  Vitals:   02/17/20 0630 02/17/20 0700 02/17/20 0800 02/17/20 0900  BP: 129/67 131/74 (!) 157/78 128/64  Pulse: (!) 59 62 98 60  Resp: 12 11 17 17   Temp:   98.4 F (36.9 C)   TempSrc:   Oral   SpO2: 96% 97% 96% 96%  Weight:       CBC:  Recent Labs  Lab 02/16/20 2142 02/16/20 2143  WBC 11.6*  --   NEUTROABS 9.9*  --   HGB 12.6 13.6  HCT 39.3 40.0  MCV 100.3*  --   PLT 166  --    Basic Metabolic Panel:  Recent Labs  Lab 02/16/20 2142 02/16/20 2143  NA 141 145  K 3.5 3.5  CL 104 103  CO2 26  --   GLUCOSE 133* 128*  BUN 8 11  CREATININE 0.88 0.80  CALCIUM 9.6  --    Lipid Panel:  Recent Labs  Lab 02/17/20 0350  CHOL 130  TRIG 51  HDL 50  CHOLHDL 2.6  VLDL 10  LDLCALC 70   HgbA1c:  Recent Labs  Lab 02/17/20 0350  HGBA1C 5.5   Urine Drug Screen: No results for input(s): LABOPIA, COCAINSCRNUR, LABBENZ, AMPHETMU, THCU, LABBARB in the last 168 hours.  Alcohol Level No results for input(s): ETH in the last 168 hours.  IMAGING past 24 hours CT Code Stroke CTA Head W/WO contrast  Result Date: 02/16/2020 CLINICAL DATA:  Right facial droop EXAM: CT ANGIOGRAPHY HEAD AND NECK TECHNIQUE: Multidetector CT imaging of the head and neck was performed using the standard protocol during bolus administration of intravenous contrast. Multiplanar CT image reconstructions and MIPs were obtained to evaluate the vascular anatomy. Carotid stenosis measurements (when applicable) are obtained utilizing NASCET criteria, using the distal internal carotid diameter as the denominator. CONTRAST:  75 mL Omnipaque 350 COMPARISON:  None. FINDINGS:  CTA NECK Aortic arch: Great vessel origins are patent. Right carotid system: Patent. Minimal calcified plaque at the ICA origin without measurable stenosis. Mild tortuosity of the distal cervical ICA. Left carotid system: Patent. Trace calcified plaque at the ICA origin without measurable stenosis. Mild tortuosity of the distal cervical ICA. Vertebral arteries: Patent.  No measurable stenosis. Skeleton: Cervical spine degenerative changes. Other neck: No mass or adenopathy. Upper chest: No apical lung mass. Dilatation of the main pulmonary artery reflecting pulmonary arterial hypertension. Review of the MIP images confirms the above findings CTA HEAD Anterior circulation: Intracranial internal carotid arteries are patent with mild atherosclerotic irregularity. Anterior and middle cerebral arteries are patent. Posterior circulation: Intracranial vertebral arteries, basilar artery, and posterior cerebral arteries are patent. PICA, AICA, and superior cerebellar artery origins are patent. There are patent bilateral posterior communicating arteries. Venous sinuses: Patent as allowed by contrast bolus timing. Review of the MIP images confirms the above findings IMPRESSION: No large vessel occlusion or hemodynamically significant stenosis. Electronically Signed   By: Guadlupe Spanish M.D.   On: 02/16/2020 22:02   CT Code Stroke CTA Neck W/WO contrast  Result Date: 02/16/2020 CLINICAL DATA:  Right facial droop EXAM: CT ANGIOGRAPHY HEAD AND NECK TECHNIQUE: Multidetector CT imaging of the head and neck was performed using the standard protocol during  bolus administration of intravenous contrast. Multiplanar CT image reconstructions and MIPs were obtained to evaluate the vascular anatomy. Carotid stenosis measurements (when applicable) are obtained utilizing NASCET criteria, using the distal internal carotid diameter as the denominator. CONTRAST:  75 mL Omnipaque 350 COMPARISON:  None. FINDINGS: CTA NECK Aortic arch: Great  vessel origins are patent. Right carotid system: Patent. Minimal calcified plaque at the ICA origin without measurable stenosis. Mild tortuosity of the distal cervical ICA. Left carotid system: Patent. Trace calcified plaque at the ICA origin without measurable stenosis. Mild tortuosity of the distal cervical ICA. Vertebral arteries: Patent.  No measurable stenosis. Skeleton: Cervical spine degenerative changes. Other neck: No mass or adenopathy. Upper chest: No apical lung mass. Dilatation of the main pulmonary artery reflecting pulmonary arterial hypertension. Review of the MIP images confirms the above findings CTA HEAD Anterior circulation: Intracranial internal carotid arteries are patent with mild atherosclerotic irregularity. Anterior and middle cerebral arteries are patent. Posterior circulation: Intracranial vertebral arteries, basilar artery, and posterior cerebral arteries are patent. PICA, AICA, and superior cerebellar artery origins are patent. There are patent bilateral posterior communicating arteries. Venous sinuses: Patent as allowed by contrast bolus timing. Review of the MIP images confirms the above findings IMPRESSION: No large vessel occlusion or hemodynamically significant stenosis. Electronically Signed   By: Guadlupe Spanish M.D.   On: 02/16/2020 22:02   CT HEAD CODE STROKE WO CONTRAST  Result Date: 02/16/2020 CLINICAL DATA:  Code stroke.  Right facial droop EXAM: CT HEAD WITHOUT CONTRAST TECHNIQUE: Contiguous axial images were obtained from the base of the skull through the vertex without intravenous contrast. COMPARISON:  2018 FINDINGS: Brain: There is no acute intracranial hemorrhage, mass effect, or edema. No new loss of gray-white differentiation. There is an age-indeterminate small infarct of the superior left cerebellum. Ventricles and sulci are normal in size and configuration. Patchy hypoattenuation in the supratentorial white matter is nonspecific but may reflect mild chronic  microvascular ischemic changes. There is no extra-axial fluid collection Vascular: There is no hyperdense vessel. Mild intracranial atherosclerotic calcification at the skull base. Skull: Unremarkable. Sinuses/Orbits: No acute abnormality. Other: Mastoid air cells are clear. ASPECTS (Alberta Stroke Program Early CT Score) - Ganglionic level infarction (caudate, lentiform nuclei, internal capsule, insula, M1-M3 cortex): 7 - Supraganglionic infarction (M4-M6 cortex): 3 Total score (0-10 with 10 being normal): 10 IMPRESSION: There is no acute intracranial hemorrhage or evidence of definite acute infarction. ASPECT score is 10. New age-indeterminate small infarct of the superior left cerebellum. These results were communicated to Dr. Laurence Slate at 9:50 pmon 7/7/2021by text page via the [facility] messaging system. Electronically Signed   By: Guadlupe Spanish M.D.   On: 02/16/2020 21:57    PHYSICAL EXAM Pleasant elderly obese African-American lady not in distress. . Afebrile. Head is nontraumatic. Neck is supple without bruit.    Cardiac exam no murmur or gallop. Lungs are clear to auscultation. Distal pulses are well felt. Neurological Exam :  Awake alert oriented to time place and person.  Mild dysarthria.  No aphasia.  Extraocular movements are significantly affected with bilateral horizontal gaze palsy.  There is only trace movements of both eyes when looking to the right of the left.  The left eye has good vertical up-and-down gaze in the right eye and sluggish but preserved up-and-down gaze.  No nystagmus..  Blinks to threat bilaterally.  Moderate left lower face and mild left upper face weakness.  Tongue midline.  Motor system exam shows no upper or lower extremity drift  but diminished fine finger movements on the right and orbits left over right upper extremity.  Mild bilateral lower extremity proximal weakness right greater than left.  Sensation appears intact.  Gait not tested.  ASSESSMENT/PLAN [name] is a 78 y.o. female with history of hypertension, hyperlipidemia, DVT, TIA, PE, Takotsubo cardiomyopathy, obstructive sleep apnea presenting with Left facial droop, double vision, slurred speech, right side weakness. Received tPA 02/16/2020 at 2142. Worsened post tIA w/ worsened R hemiparesis which improved w/ laying flat and fluids.   Stroke:  Possible L brain subcortical infarct s/p tPA, workup underway  Code Stroke CT head No acute abnormality. Age indeterminate L cerebellar infarct. Small vessel disease. Atrophy. ASPECTS 10.     CTA head & neck no LVO or significant stenosis   CT head w/ increased lethargy stable. No acute infarct. 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 Resumed scheduled home meds .  Long-term BP goal normotensive  Hyperlipidemia  Home meds:  liptior 20, resumed in hospital  LDL 70, goal < 70  Will hold on more intensive statin given LDL at goal  Continue statin at discharge  Other Stroke Risk Factors  Advanced age  Former Cigarette smoker  Obesity, Body mass index is 35.56 kg/m., recommend weight loss, diet and exercise as appropriate   Hx stroke/TIA - no documented hx stroke in EPIC  Obstructive sleep apnea, on CPAP at home  Takotsubo cardiomyopathy  Hx DVT   Other Active Problems  Baseline dementia on aricept and namenda since, memory probs since 2013  Hospital day # 1 She presented with slurred  speech facial droop and right-sided weakness secondary to suspected left subcortical infarct and received IV TPA.  She had transient worsening and developed lower lip swelling likely due to anaphylaxis from TPA and was treated with steroids and Benadryl and is improving.  Continue close neurological monitoring and strict blood pressure control as per post TPA protocol.  Mobilize out of bed.  Therapy consults.  Check MRI scan of the brain later today.  Start aspirin if no post TPA bleed.  No family available at the bedside for discussion.This patient is critically ill and at significant risk of neurological worsening, death and care requires constant monitoring of vital signs, hemodynamics,respiratory and cardiac monitoring, extensive review of multiple databases, frequent neurological assessment, discussion with family, other specialists and medical decision making of high complexity.I have made any additions or clarifications directly to the above note.This critical care time does not reflect procedure time, or teaching time or supervisory time of PA/NP/Med Resident etc but could involve care discussion time.  I spent 40 minutes of neurocritical care time  in the care of  this patient.  Addendum.  I was called that the patient was sleepy and hard to arouse after rounds in late morning today.  Stat CT scan of the head was done which showed no acute  abnormality.  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  There is no post TPA bleed.  When I reassessed the patient she was easily arousable and interactive and no longer sleepy.   Sarah Heady, MD  To contact Stroke Continuity provider, please refer to [email or web address]. After hours, contact General Neurology

## 2020-02-17 NOTE — Evaluation (Signed)
Occupational Therapy Evaluation Patient Details Name: [name] MRN: [id number] DOB: [birth year] Today's Date: 02/17/2020    History of Present Illness 79 y.o. female with past medical history significant for hypertension, hyperlipidemia, DVT, TIA, PE, Takotsubo cardiomyopathy, obstructive sleep apnea presents to emergency department as a code stroke for sudden onset slurred speech, double vision. CT head and CTA negative. Pt received IV tPA.   Clinical Impression   Pt admitted with above. She demonstrates the below listed deficits and will benefit from continued OT to maximize safety and independence with BADLs.  Pt presents to OT with generalized weakness, impaired balance, impaired vision, impaired cognition.  She requires min A - total A with ADLs and max A - total A with functional transfers.  She lives with her grand daughter, and mod I with functional mobility, and required intermittent assist with ADLs.  Recommend CIR level rehab.        Follow Up Recommendations  CIR;Supervision/Assistance - 24 hour    Equipment Recommendations  None recommended by OT    Recommendations for Other Services Rehab consult     Precautions / Restrictions Precautions Precautions: Fall Restrictions Weight Bearing Restrictions: No      Mobility Bed Mobility Overal bed mobility: Needs Assistance Bed Mobility: Supine to Sit     Supine to sit: Min guard;HOB elevated        Transfers Overall transfer level: Needs assistance Equipment used: 1 person hand held assist Transfers: Sit to/from [employer] Transfers Sit to Stand: Max assist Stand pivot transfers: Max assist       General transfer comment: pt unable to safely obtain standing position with PT assistance and use of RW, PT provides R knee block and BUE support to assist pt into standing, pt with increased trunk flexion when stepping and turning to recliner    Balance Overall balance assessment: Needs  assistance Sitting-balance support: Single extremity supported;Feet supported Sitting balance-Leahy Scale: Poor Sitting balance - Comments: pt with heavy Rt lateral lean  Postural control: Right lateral lean Standing balance support: Bilateral upper extremity supported Standing balance-Leahy Scale: Zero Standing balance comment: attempted to move pt into standing, however, pt with heavy Rt lateral lean and unable to stand safely with +1 assist                            ADL either performed or assessed with clinical judgement   ADL Overall ADL's : Needs assistance/impaired Eating/Feeding: Minimal assistance;Sitting   Grooming: Wash/dry hands;Wash/dry face;Oral care;Brushing hair;Moderate assistance;Sitting   Upper Body Bathing: Moderate assistance;Sitting   Lower Body Bathing: Maximal assistance;Sit to/from stand   Upper Body Dressing : Maximal assistance;Sitting   Lower Body Dressing: Total assistance;Sit to/from stand   Toilet Transfer: Total assistance Toilet Transfer Details (indicate cue type and reason): unable to safely attempt with +1 assist  Toileting- Clothing Manipulation and Hygiene: Total assistance;Sit to/from stand       Functional mobility during ADLs: Total assistance General ADL Comments: Pt fatigued.  Heavy rt lateral lean      Vision Baseline Vision/History: Wears glasses Wears Glasses: At all times Patient Visual Report: No change from baseline Vision Assessment?: Yes Eye Alignment: Impaired (comment) Ocular Range of Motion: Restricted on the left;Restricted looking up;Restricted looking down;Restricted on the right Alignment/Gaze Preference: Gaze right Tracking/Visual Pursuits: Left eye does not track laterally;Left eye does not track medially Convergence: Impaired (comment) Visual Fields: No apparent deficits Additional Comments: Pt keeps head/neck rotated to  the Rt and demonstrates difficulty targeting items on the lt, but fields appear  intact on the Lt      Perception Perception Perception Tested?: Yes Perception Deficits: Inattention/neglect Comments: questionable Lt visual inattention    Praxis Praxis Praxis tested?: Within functional limits    Pertinent Vitals/Pain Pain Assessment: Faces Faces Pain Scale: Hurts a little bit Pain Location: generalized Pain Descriptors / Indicators: Grimacing Pain Intervention(s): Monitored during session     Hand Dominance Left   Extremity/Trunk Assessment Upper Extremity Assessment Upper Extremity Assessment: Generalized weakness   Lower Extremity Assessment Lower Extremity Assessment: Defer to PT evaluation   Cervical / Trunk Assessment Cervical / Trunk Assessment: Other exceptions;Kyphotic Cervical / Trunk Exceptions: morbid obesity   Communication Communication Communication:  (mild dysarthria)   Cognition Arousal/Alertness: Awake/alert (initially lethargic, arouses to verbal and tactile stimuli) Behavior During Therapy: Flat affect Overall Cognitive Status: Impaired/Different from baseline Area of Impairment: Attention;Memory;Following commands;Safety/judgement;Awareness;Problem solving                   Current Attention Level: Sustained Memory: Decreased recall of precautions;Decreased short-term memory Following Commands: Follows one step commands with increased time Safety/Judgement: Decreased awareness of safety;Decreased awareness of deficits Awareness: Intellectual Problem Solving: Slow processing;Requires verbal cues     General Comments  VSS     Exercises     Shoulder Instructions      Home Living Family/patient expects to be discharged to:: Private residence Living Arrangements: Other relatives (gdtr) Available Help at Discharge: Family;Available PRN/intermittently (gdtr works nights per pt) Type of Home: House Home Access: Stairs to enter [employer] of Steps: 6 Entrance Stairs-Rails: Right;Left Home Layout: One  level     Bathroom Shower/Tub: Chief Strategy Officer: Standard     Home Equipment: Environmental consultant - 2 wheels;Wheelchair - manual   Additional Comments: Pt reports she lives with her grand daughter who works at night       Prior Functioning/Environment Level of Independence: Needs assistance  Gait / Transfers Assistance Needed: pt reports ambulating with a RW for household distances ADL's / Homemaking Assistance Needed: Pt reports grand daughter assists her with bathing, and all IADLs             OT Problem List: Decreased strength;Decreased activity tolerance;Impaired balance (sitting and/or standing);Impaired vision/perception;Decreased coordination;Decreased cognition;Decreased safety awareness;Decreased knowledge of use of DME or AE;Obesity      OT Treatment/Interventions: Self-care/ADL training;Neuromuscular education;DME and/or AE instruction;Therapeutic activities;Cognitive remediation/compensation;Visual/perceptual remediation/compensation;Patient/family education;Balance training    OT Goals(Current goals can be found in the care plan section) Acute Rehab OT Goals Patient Stated Goal: to go home  OT Goal Formulation: With patient Time For Goal Achievement: 03/02/20 Potential to Achieve Goals: Good  OT Frequency: Min 2X/week   Barriers to D/C: Decreased caregiver support          Co-evaluation              AM-PAC OT "6 Clicks" Daily Activity     Outcome Measure Help from another person eating meals?: A Little Help from another person taking care of personal grooming?: A Lot Help from another person toileting, which includes using toliet, bedpan, or urinal?: Total Help from another person bathing (including washing, rinsing, drying)?: A Lot Help from another person to put on and taking off regular upper body clothing?: A Lot Help from another person to put on and taking off regular lower body clothing?: Total 6 Click Score: 11   End of Session  Equipment Utilized During  Treatment: Gait belt Nurse Communication: Mobility status;Need for lift equipment  Activity Tolerance: Patient tolerated treatment well Patient left: in chair;with call bell/phone within reach;with chair alarm set  OT Visit Diagnosis: Unsteadiness on feet (R26.81);Low vision, both eyes (H54.2)                Time: 9735-3299 OT Time Calculation (min): 24 min Charges:  OT General Charges $OT Visit: 1 Visit OT Evaluation $OT Eval Moderate Complexity: 1 Mod OT Treatments $Therapeutic Activity: 8-22 mins  Eber Jones., OTR/L Acute Rehabilitation Services Pager [phone] Office [phone]   Jeani Hawking M 02/17/2020, 6:35 PM

## 2020-02-17 NOTE — Evaluation (Addendum)
Clinical/Bedside Swallow Evaluation Patient Details  Name: TONIA AVINO MRN: [id number] Date of Birth: Dec 12, 1940  Today's Date: 02/17/2020 Time: SLP Start Time (ACUTE ONLY): 0910 SLP Stop Time (ACUTE ONLY): 0920 SLP Time Calculation (min) (ACUTE ONLY): 10 min  Past Medical History:  Past Medical History:  Diagnosis Date  . Arthritis   . Asthma   . Chronic back pain   . Depression   . History of pulmonary embolus (PE)   . Hx-TIA (transient ischemic attack)   . Hyperlipemia   . Hypertension   . Personal history of DVT (deep vein thrombosis)   . Scoliosis   . Sleep apnea    cpap - settingsi at 2   . 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 ARTHROSCOPIC REPAIR ACL  02/03/2004  . LEFT HEART CATH AND CORONARY ANGIOGRAPHY N/A 09/16/2019   Procedure: LEFT HEART CATH AND CORONARY ANGIOGRAPHY;  Surgeon: Yvonne Kendall, MD;  Location: [facility];  Service: Cardiovascular;  Laterality: N/A;  . OOPHORECTOMY  1970  . SPINE SURGERY  03/31/2006  . TOTAL KNEE ARTHROPLASTY Left 05/17/2016   Procedure: LEFT TOTAL KNEE ARTHROPLASTY;  Surgeon: Kathryne Hitch, MD;  Location: [facility];  Service: Orthopedics;  Laterality: Left;  Adductor Block; Spinal to General  . TUBAL LIGATION  1983   HPI:  NALEA SALCE is a 79 y.o. female with past medical history significant for hypertension, hyperlipidemia, DVT, TIA, PE, Takotsubo cardiomyopathy, obstructive sleep apnea presents to emergency department as a code stroke for sudden onset slurred speech, double vision. Patient received IV TPA after discussing risk versus benefit.  She subsequently worsened after IV TPA with worsening right-sided weakness, improved after laying head of bed flat and giving IV fluids. 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    Assessment / Plan / Recommendation Clinical Impression  Pt demonstrates no signs of acute dysphagia; she does have a swollen upper lip and also missing dentition. At baseline she does not wear her ill fitting denture with meals and today demonstrates a frontal nibbling type mastication pattern, which is typical. There is no oral residue, and no coughing with 3 oz consumed consecutively. Recommend pt resume a soft (dys 2) diet with thin liquids. Will f/u for tolerance given concern for a brainstem infacts and mild baseline oral dysphagia.  SLP Visit Diagnosis: Dysphagia, oral phase (R13.11)    Aspiration Risk  Mild aspiration risk    Diet Recommendation Dysphagia 2 (Fine chop);Thin liquid   Liquid Administration via: Cup;Straw Medication Administration: Whole meds with liquid Supervision: Staff to assist with self feeding;Full supervision/cueing for compensatory strategies Compensations: Slow rate;Small sips/bites Postural Changes: Seated upright at 90 degrees    Other  Recommendations Oral Care Recommendations: Oral care BID   Follow up Recommendations        Frequency and Duration min 2x/week  1 week       Prognosis Prognosis for Safe Diet Advancement: Fair      Swallow Study   General HPI: ARIHANA AMBROCIO is a 79 y.o. female with past medical history significant for hypertension, hyperlipidemia, DVT, TIA, PE, Takotsubo cardiomyopathy, obstructive sleep apnea presents to emergency department as a code stroke for sudden onset slurred speech, double vision. Patient received IV TPA after discussing risk versus benefit.  She subsequently  worsened after IV TPA with worsening right-sided weakness, improved after laying head of bed flat and giving IV fluids. 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  Type of Study: Bedside Swallow Evaluation Previous Swallow Assessment: none Diet Prior to this Study: NPO Temperature Spikes Noted: No Respiratory Status: Room air History of Recent Intubation: No Behavior/Cognition: Alert;Cooperative;Pleasant mood Oral Cavity Assessment: Other (comment) (lip edema) Oral Care Completed by SLP: No Oral Cavity - Dentition: Missing dentition Self-Feeding Abilities: Needs assist Patient Positioning: Upright in bed Baseline Vocal Quality: Normal Volitional Cough: Strong Volitional Swallow: Able to elicit    Oral/Motor/Sensory Function     Ice Chips Ice chips: Within functional limits Presentation: Spoon   Thin Liquid Thin Liquid: Within functional limits Presentation: Cup;Straw    Nectar Thick Nectar Thick Liquid: Not tested   Honey Thick Honey Thick Liquid: Not tested   Puree Puree: Within functional limits Presentation: Spoon   Solid     Solid: Impaired Presentation: Self Fed Oral Phase Impairments: Impaired mastication;Other (comment) (nibbles with bottom front teeth and gums)      Gudelia Eugene, Riley Nearing 02/17/2020,10:09 AM

## 2020-02-17 NOTE — Evaluation (Signed)
Speech Language Pathology Evaluation Patient Details Name: [name] MRN: [id number] DOB: 02/06/1941 Today's Date: 02/17/2020 Time: 0920-0940 SLP Time Calculation (min) (ACUTE ONLY): 20 min  Problem List:  Patient Active Problem List   Diagnosis Date Noted  . Stroke (HCC) 02/16/2020  . Hx-TIA (transient ischemic attack)   . Hyperlipemia   . Hypertension   . Sleep apnea   . History of pulmonary embolus (PE)   . NSTEMI (non-ST elevated myocardial infarction) (HCC) 09/16/2019  . 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 Depression 07/29/2018  . Acute pulmonary embolism (HCC) 09/29/2016  . Acute encephalopathy 09/28/2016  . Left knee pain 09/28/2016  . Left leg swelling 09/28/2016  . Hilar mass 09/28/2016  . OSA on CPAP 09/28/2016  . HTN (hypertension) 09/28/2016  . HLD (hyperlipidemia) 09/28/2016  . Chronic pain 09/28/2016  . Cellulitis of left leg 06/14/2016  . Osteoarthritis of left knee 05/17/2016  . 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 Arthritis   . Asthma   . Chronic back pain   . Depression   . History of pulmonary embolus (PE)   . Hx-TIA (transient ischemic attack)   . Hyperlipemia   . Hypertension   . Personal history of DVT (deep vein thrombosis)   . Scoliosis   . Sleep apnea    cpap - settingsi at 2   . 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 ARTHROSCOPIC REPAIR ACL  02/03/2004  . LEFT HEART CATH AND CORONARY ANGIOGRAPHY N/A 09/16/2019   Procedure: LEFT HEART CATH AND CORONARY ANGIOGRAPHY;  Surgeon: Yvonne Kendall, MD;  Location: [facility];  Service: Cardiovascular;  Laterality: N/A;  . OOPHORECTOMY  1970  . SPINE SURGERY  03/31/2006  . TOTAL KNEE ARTHROPLASTY Left 05/17/2016   Procedure: LEFT TOTAL KNEE ARTHROPLASTY;  Surgeon: Kathryne Hitch, MD;  Location: [facility];  Service:  Orthopedics;  Laterality: Left;  Adductor Block; Spinal to General  . TUBAL LIGATION  1983   HPI:  [name] is a 79 y.o. female with past medical history significant for hypertension, hyperlipidemia, DVT, TIA, PE, Takotsubo cardiomyopathy, obstructive sleep apnea presents to emergency department as a code stroke for sudden onset slurred speech, double vision. Patient received IV TPA after discussing risk versus benefit.  She subsequently worsened after IV TPA with worsening right-sided weakness, improved after laying head of bed flat and giving IV fluids. 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    Assessment / Plan / Recommendation Clinical Impression  Pt demonstrates mild dysarthria due to left CN VII weakness, complicated by lip edema following tPA. Pt is intelligible and lingual movement is good. Her cognition is midlly impaired, long term memory is adequate, but her short term memory and ability to alternate between cognitive tasks and problem solve functional tasks is reduced given quick mental fatigue. However, her son at bedside reports that he is responsible for all of her ADL's. She has a history of a debilitating injury and anything more complex than self feeding is his responsibility. Pt unlikely to require any f/u speech therapy given her level of dependence on her son as well as adequate intelligibility. Her main deficits are physical, related to visual impairment, which would be better addressed  with PT/OT. Will defer any further acute work up at this time.     SLP Assessment  SLP Recommendation/Assessment: All further Speech Lanaguage Pathology  needs can be addressed in the next venue of care SLP Visit Diagnosis: Cognitive communication deficit (R41.841)    Follow Up Recommendations       Frequency and  Duration min 2x/week         SLP Evaluation Cognition  Orientation Level: Oriented X4 Attention: Focused;Selective;Sustained Focused Attention: Appears intact Sustained Attention: Appears intact Selective Attention: Impaired Selective Attention Impairment: Verbal basic;Functional basic Memory: Impaired Memory Impairment: Retrieval deficit;Decreased short term memory Decreased Short Term Memory: Verbal basic Awareness: Appears intact Problem Solving: Impaired Problem Solving Impairment: Verbal basic;Functional basic       Comprehension  Auditory Comprehension Overall Auditory Comprehension: Appears within functional limits for tasks assessed    Expression Verbal Expression Overall Verbal Expression: Appears within functional limits for tasks assessed   Oral / Motor  Motor Speech Overall Motor Speech: Impaired Phonation: Normal Resonance: Within functional limits Articulation: Impaired Level of Impairment: Word Intelligibility: Intelligible Motor Planning: Witnin functional limits Motor Speech Errors: Aware;Consistent   GO                    Tacy Chavis, Riley Nearing 02/17/2020, 11:23 AM

## 2020-02-17 NOTE — Progress Notes (Signed)
OT Cancellation Note  Patient Details Name: [name] MRN: [id number] DOB: [birth year]   Cancelled Treatment:    Reason Eval/Treat Not Completed: Patient at procedure or test/ unavailable.  Will reattempt.  Eber Jones., OTR/L Acute Rehabilitation Services Pager [phone] Office [phone]   Jeani Hawking M 02/17/2020, 1:23 PM

## 2020-02-18 ENCOUNTER — Ambulatory Visit (HOSPITAL_COMMUNITY): Payer: Medicare Other

## 2020-02-18 ENCOUNTER — Inpatient Hospital Stay (HOSPITAL_COMMUNITY): Payer: Medicare Other

## 2020-02-18 DIAGNOSIS — I10 Essential (primary) hypertension: Secondary | ICD-10-CM

## 2020-02-18 DIAGNOSIS — E785 Hyperlipidemia, unspecified: Secondary | ICD-10-CM

## 2020-02-18 DIAGNOSIS — D72829 Elevated white blood cell count, unspecified: Secondary | ICD-10-CM

## 2020-02-18 DIAGNOSIS — I639 Cerebral infarction, unspecified: Secondary | ICD-10-CM

## 2020-02-18 DIAGNOSIS — Z86718 Personal history of other venous thrombosis and embolism: Secondary | ICD-10-CM

## 2020-02-18 DIAGNOSIS — G4733 Obstructive sleep apnea (adult) (pediatric): Secondary | ICD-10-CM

## 2020-02-18 DIAGNOSIS — I5181 Takotsubo syndrome: Secondary | ICD-10-CM

## 2020-02-18 MED ORDER — ENOXAPARIN SODIUM 40 MG/0.4ML ~~LOC~~ SOLN
40.0000 mg | SUBCUTANEOUS | Status: DC
  Administered 2020-02-18 – 2020-02-21 (×4): 40 mg via SUBCUTANEOUS
  Filled 2020-02-18 (×4): qty 0.4

## 2020-02-18 MED ORDER — CLOPIDOGREL BISULFATE 75 MG PO TABS
75.0000 mg | ORAL_TABLET | Freq: Every day | ORAL | Status: DC
  Administered 2020-02-18 – 2020-02-21 (×4): 75 mg via ORAL
  Filled 2020-02-18 (×4): qty 1

## 2020-02-18 MED ORDER — ASPIRIN EC 81 MG PO TBEC
81.0000 mg | DELAYED_RELEASE_TABLET | Freq: Every day | ORAL | Status: DC
  Administered 2020-02-19 – 2020-02-21 (×3): 81 mg via ORAL
  Filled 2020-02-18 (×3): qty 1

## 2020-02-18 NOTE — Progress Notes (Signed)
Physical Therapy Treatment Patient Details Name: [name] MRN: [id number] DOB: 11-Jan-1941 Today's Date: 02/18/2020    History of Present Illness 79 y.o. female with past medical history significant for hypertension, hyperlipidemia, DVT, TIA, PE, Takotsubo cardiomyopathy, obstructive sleep apnea presents to emergency department as a code stroke for sudden onset slurred speech, double vision. CT head and CTA negative. Pt received IV tPA.    PT Comments    Pt tolerates treatment well, demonstrating improved transfer quality and initiating some gait training. Pt does demonstrate R lateral lean which she is only able to partially correct for with verbal and tactile cues at this time. Pt remains at a high falls risk due to general weakness and imbalance, and will continue to benefit from aggressive mobility and PT POC to reduce falls risk. PT continues to recommend CIR at the time of discharge as the pt demonstrates the potential to return to a supervision or modI level of mobility with continued intensive therapies.   Follow Up Recommendations  CIR     Equipment Recommendations  Hospital bed;Other (comment)    Recommendations for Other Services       Precautions / Restrictions Precautions Precautions: Fall Restrictions Weight Bearing Restrictions: No    Mobility  Bed Mobility Overal bed mobility: Needs Assistance Bed Mobility: Supine to Sit     Supine to sit: Min guard;HOB elevated        Transfers Overall transfer level: Needs assistance Equipment used: Rolling walker (2 wheeled);1 person hand held assist Transfers: Sit to/from [employer] Sit to Stand: Mod assist Stand pivot transfers: Mod assist       General transfer comment: pt performs 2 stand pivot transfers with PT assist, 3 sit to stands with use of RW  Ambulation/Gait Ambulation/Gait assistance: Mod assist Gait Distance (Feet): 1 Feet Assistive device: Rolling walker (2 wheeled) Gait  Pattern/deviations: Step-to pattern Gait velocity: reduced Gait velocity interpretation: <1.31 ft/sec, indicative of household ambulator General Gait Details: pt with short steps, taking one step forward with each foot, otherwise working on [employer] and weight shifting   Stairs             Wheelchair Mobility    Modified Rankin (Stroke Patients Only) Modified Rankin (Stroke Patients Only) Pre-Morbid Rankin Score: Moderate disability Modified Rankin: Severe disability     Balance Overall balance assessment: Needs assistance Sitting-balance support: Single extremity supported;Feet supported Sitting balance-Leahy Scale: Poor Sitting balance - Comments: relaint on UE support Postural control: Posterior lean Standing balance support: Bilateral upper extremity supported Standing balance-Leahy Scale: Poor Standing balance comment: modA to maintain static standing due to R lateral lean                            Cognition Arousal/Alertness: Awake/alert Behavior During Therapy: WFL for tasks assessed/performed Overall Cognitive Status: Impaired/Different from baseline Area of Impairment: Attention;Memory;Safety/judgement;Awareness                   Current Attention Level: Sustained Memory: Decreased recall of precautions Following Commands: Follows one step commands consistently Safety/Judgement: Decreased awareness of deficits Awareness: Emergent Problem Solving: Requires verbal cues        Exercises      General Comments General comments (skin integrity, edema, etc.): VSS on RA      Pertinent Vitals/Pain Pain Assessment: Faces Faces Pain Scale: Hurts little more Pain Location: back Pain Descriptors / Indicators: Grimacing Pain Intervention(s): Monitored during session  Home Living                      Prior Function            PT Goals (current goals can now be found in the care plan section) Acute Rehab PT  Goals Patient Stated Goal: to go home  Progress towards PT goals: Progressing toward goals    Frequency    Min 4X/week      PT Plan Current plan remains appropriate    Co-evaluation              AM-PAC PT "6 Clicks" Mobility   Outcome Measure  Help needed turning from your back to your side while in a flat bed without using bedrails?: A Little Help needed moving from lying on your back to sitting on the side of a flat bed without using bedrails?: A Little Help needed moving to and from a bed to a chair (including a wheelchair)?: A Lot Help needed standing up from a chair using your arms (e.g., wheelchair or bedside chair)?: A Lot Help needed to walk in hospital room?: Total Help needed climbing 3-5 steps with a railing? : Total 6 Click Score: 12    End of Session   Activity Tolerance: Patient tolerated treatment well Patient left: in bed;with call bell/phone within reach;with bed alarm set;with nursing/sitter in room Nurse Communication: Mobility status PT Visit Diagnosis: Other abnormalities of gait and mobility (R26.89);Unsteadiness on feet (R26.81);Muscle weakness (generalized) (M62.81)     Time: 8101-7510 PT Time Calculation (min) (ACUTE ONLY): 30 min  Charges:  $Therapeutic Activity: 23-37 mins                     Arlyss Gandy, PT, DPT Acute Rehabilitation Pager: [phone]    Arlyss Gandy 02/18/2020, 11:34 AM

## 2020-02-18 NOTE — Consult Note (Signed)
Physical Medicine and Rehabilitation Consult Reason for Consult: Slurred speech with left facial droop and right side weakness. Referring Physician: Dr. Pearlean Brownie  HPI: [name] is a 79 y.o. 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  History taken from patient and chart review due to some cognitive delay. Patient lives with granddaughter.  1 level home 6 steps to entry.  Her granddaughter works at night, however patient states that she has a son in the area as well as a daughter-in-law that worked at different times.  Patient ambulates with a rolling walker for household distances PTA.  Granddaughter assist her with some bathing and ADLs.  She presented on 02/16/2020 with left facial droop, dysarthria and visual changes.  MRI brain personally reviewed, showing size left  > right brainstem infarct.  Per report CT/MRI showed acute bilateral pontine infarcts, left > right. Punctate focus of acute ischemia in the left cerebellum.  CT angiogram of head and neck with no large vessel occlusion.  Patient  receive TPA.  Echocardiogram from 12/2019 showing EF of 55-60%.  Echocardiogram unable to be completed, plan to follow up. Admission chemistries unremarkable.  ECG reviewed, normal sinus rhythm.  Currently maintained on aspirin 325 mg daily for CVA prophylaxis.  Hospital course complicated by post stroke dysphagia, started on dysphagia #2 thin liquid diet.  Therapy evaluation completed with recommendations of physical medicine rehab consult.   Review of Systems  Constitutional: Positive for malaise/fatigue. Negative for chills and fever.  HENT: Negative for hearing loss.   Eyes: Positive for blurred vision. Negative for double vision.  Respiratory: Negative for cough and shortness of breath.   Cardiovascular: Positive for leg swelling. Negative for chest pain and palpitations.  Gastrointestinal: Positive for  constipation. Negative for heartburn, nausea and vomiting.  Genitourinary: Negative for dysuria, flank pain and hematuria.  Musculoskeletal: Positive for myalgias.  Skin: Negative for rash.  Neurological: Positive for speech change, focal weakness and weakness.  Psychiatric/Behavioral: Positive for depression. The patient has insomnia.   All other systems reviewed and are negative.  Past Medical History:  Diagnosis Date  . Arthritis   . Asthma   . Chronic back pain   . Depression   . History of pulmonary embolus (PE)   . Hx-TIA (transient ischemic attack)   . Hyperlipemia   . Hypertension   . Personal history of DVT (deep vein thrombosis)   . Scoliosis   . Sleep apnea    cpap - settingsi at 3   . 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 ARTHROSCOPIC REPAIR ACL  02/03/2004  . LEFT HEART CATH AND CORONARY ANGIOGRAPHY N/A 09/16/2019   Procedure: LEFT HEART CATH AND CORONARY ANGIOGRAPHY;  Surgeon: Yvonne Kendall, MD;  Location: [facility];  Service: Cardiovascular;  Laterality: N/A;  . OOPHORECTOMY  1970  . SPINE SURGERY  03/31/2006  . TOTAL KNEE ARTHROPLASTY Left 05/17/2016   Procedure: LEFT TOTAL KNEE ARTHROPLASTY;  Surgeon: Kathryne Hitch, MD;  Location: [facility];  Service: Orthopedics;  Laterality: Left;  Adductor Block; Spinal to General  . TUBAL LIGATION  1983   Family History  Problem Relation Age of Onset  . Other Father        kidney problems   Social History:  reports that she quit smoking about 46 years ago. Her smoking use included cigarettes. She has a 10.00 pack-year smoking history. She has never used  smokeless tobacco. She reports that she does not drink alcohol and does not use drugs. Allergies:  Allergies  Allergen Reactions  . T-Pa [Alteplase] Swelling    Lip swelling immediately post tPA  . Codeine Itching   Medications Prior to Admission  Medication Sig Dispense Refill  . amLODipine  (NORVASC) 10 MG tablet Take 10 mg by mouth daily.    Marland Kitchen aspirin EC 81 MG EC tablet Take 1 tablet (81 mg total) by mouth daily. 90 tablet 3  . atorvastatin (LIPITOR) 20 MG tablet Take 20 mg by mouth at bedtime.     . busPIRone (BUSPAR) 30 MG tablet Take 1 tablet (30 mg total) by mouth in the morning and at bedtime. 180 tablet 0  . cholecalciferol (VITAMIN D3) 25 MCG (1000 UT) tablet Take 1,000 Units by mouth daily.    Marland Kitchen donepezil (ARICEPT) 10 MG tablet Take 10 mg by mouth at bedtime.     . furosemide (LASIX) 40 MG tablet Take 40 mg by mouth as needed for edema. PRN    . hydrochlorothiazide (HYDRODIURIL) 25 MG tablet Take 25 mg by mouth daily.    Marland Kitchen linaclotide (LINZESS) 145 MCG CAPS capsule Take 145 mcg by mouth as needed. constipation    . lisinopril (PRINIVIL,ZESTRIL) 40 MG tablet Take 40 mg by mouth daily.     . memantine (NAMENDA XR) 28 MG CP24 24 hr capsule Take 28 mg by mouth daily.   0  . mirtazapine (REMERON) 30 MG tablet Take 15 mg by mouth at bedtime. 1/4-1/2 prn sleep    . Multiple Vitamins-Minerals (MULTI-VITAMIN GUMMIES PO) Take by mouth.    . nitroGLYCERIN (NITROSTAT) 0.4 MG SL tablet Place 1 tablet (0.4 mg total) under the tongue every 5 (five) minutes as needed for chest pain. 25 tablet 2  . omeprazole (PRILOSEC) 20 MG capsule Take 20 mg by mouth daily.    Marland Kitchen oxyCODONE (ROXICODONE) 15 MG immediate release tablet Take 7.5 mg by mouth 4 (four) times daily as needed for pain.     . potassium chloride (KLOR-CON) 10 MEQ tablet Take 10 mEq by mouth daily.    . RESTASIS 0.05 % ophthalmic emulsion Place 1 drop into both eyes daily as needed (dry eyes).     . zinc gluconate 50 MG tablet Take 25 mg by mouth daily.      Home: Home Living Family/patient expects to be discharged to:: Private residence Living Arrangements: Other relatives (gdtr) Available Help at Discharge: Family, Available PRN/intermittently (gdtr works nights per pt) Type of Home: House Home Access: Stairs to  enter Secretary/administrator of Steps: 6 Entrance Stairs-Rails: Right, Left Home Layout: One level Bathroom Shower/Tub: Engineer, manufacturing systems: Standard Home Equipment: Environmental consultant - 2 wheels, Wheelchair - manual Additional Comments: Pt reports she lives with her grand daughter who works at night   Functional History: Prior Function Level of Independence: Needs Technical sales engineer / Transfers Assistance Needed: pt reports ambulating with a RW for household distances ADL's / Engineer, water Assistance Needed: Pt reports grand daughter assists her with bathing, and all IADLs  Functional Status:  Mobility: Bed Mobility Overal bed mobility: Needs Assistance Bed Mobility: Supine to Sit Supine to sit: Min guard, HOB elevated Transfers Overall transfer level: Needs assistance Equipment used: 1 person hand held assist Transfers: Sit to/from Stand, Water engineer Transfers Sit to Stand: Max assist Stand pivot transfers: Max assist General transfer comment: pt unable to safely obtain standing position with PT assistance and use of RW, PT provides R  knee block and BUE support to assist pt into standing, pt with increased trunk flexion when stepping and turning to recliner      ADL: ADL Overall ADL's : Needs assistance/impaired Eating/Feeding: Minimal assistance, Sitting Grooming: Wash/dry hands, Wash/dry face, Oral care, Brushing hair, Moderate assistance, Sitting Upper Body Bathing: Moderate assistance, Sitting Lower Body Bathing: Maximal assistance, Sit to/from stand Upper Body Dressing : Maximal assistance, Sitting Lower Body Dressing: Total assistance, Sit to/from stand Toilet Transfer: Total assistance Toilet Transfer Details (indicate cue type and reason): unable to safely attempt with +1 assist  Toileting- Clothing Manipulation and Hygiene: Total assistance, Sit to/from stand Functional mobility during ADLs: Total assistance General ADL Comments: Pt fatigued.  Heavy rt lateral lean    Cognition: Cognition Overall Cognitive Status: Impaired/Different from baseline Orientation Level: Oriented X4 Attention: Focused, Selective, Sustained Focused Attention: Appears intact Sustained Attention: Appears intact Selective Attention: Impaired Selective Attention Impairment: Verbal basic, Functional basic Memory: Impaired Memory Impairment: Retrieval deficit, Decreased short term memory Decreased Short Term Memory: Verbal basic Awareness: Appears intact Problem Solving: Impaired Problem Solving Impairment: Verbal basic, Functional basic Cognition Arousal/Alertness: Awake/alert (initially lethargic, arouses to verbal and tactile stimuli) Behavior During Therapy: Flat affect Overall Cognitive Status: Impaired/Different from baseline Area of Impairment: Attention, Memory, Following commands, Safety/judgement, Awareness, Problem solving Current Attention Level: Sustained Memory: Decreased recall of precautions, Decreased short-term memory Following Commands: Follows one step commands with increased time Safety/Judgement: Decreased awareness of safety, Decreased awareness of deficits Awareness: Intellectual Problem Solving: Slow processing, Requires verbal cues  Blood pressure (!) 110/49, pulse (!) 44, temperature 98.9 F (37.2 C), temperature source Oral, resp. rate 12, weight 88.2 kg, SpO2 95 %. Physical Exam Vitals reviewed.  Constitutional:      Appearance: She is obese.  HENT:     Head: Normocephalic and atraumatic.     Right Ear: External ear normal.     Left Ear: External ear normal.     Nose: Nose normal.  Eyes:     General:        Right eye: No discharge.        Left eye: No discharge.     Comments: Fixed gaze  Cardiovascular:     Rate and Rhythm: Regular rhythm. Bradycardia present.  Pulmonary:     Effort: Pulmonary effort is normal. No respiratory distress.     Breath sounds: No stridor.  Abdominal:     General: Abdomen is flat. There is no  distension.  Musculoskeletal:        General: No tenderness.     Cervical back: Normal range of motion and neck supple.     Comments: LE edema  Skin:    General: Skin is warm and dry.  Neurological:     Mental Status: She is alert.     Comments: Alert and oriented x3 Dysarthria  Makes good eye contact with examiner.   Follows simple commands. Motor: RUE/RLE: 4 -/5 proximal distal LUE/LLE: 4/5 proximal distal Left facial weakness  Psychiatric:        Attention and Perception: Attention normal.        Mood and Affect: Affect is flat.     No results found for this or any previous visit (from the past 24 hour(s)). CT Code Stroke CTA Head W/WO contrast  Result Date: 02/16/2020 CLINICAL DATA:  Right facial droop EXAM: CT ANGIOGRAPHY HEAD AND NECK TECHNIQUE: Multidetector CT imaging of the head and neck was performed using the standard protocol during bolus administration of intravenous  contrast. Multiplanar CT image reconstructions and MIPs were obtained to evaluate the vascular anatomy. Carotid stenosis measurements (when applicable) are obtained utilizing NASCET criteria, using the distal internal carotid diameter as the denominator. CONTRAST:  75 mL Omnipaque 350 COMPARISON:  None. FINDINGS: CTA NECK Aortic arch: Great vessel origins are patent. Right carotid system: Patent. Minimal calcified plaque at the ICA origin without measurable stenosis. Mild tortuosity of the distal cervical ICA. Left carotid system: Patent. Trace calcified plaque at the ICA origin without measurable stenosis. Mild tortuosity of the distal cervical ICA. Vertebral arteries: Patent.  No measurable stenosis. Skeleton: Cervical spine degenerative changes. Other neck: No mass or adenopathy. Upper chest: No apical lung mass. Dilatation of the main pulmonary artery reflecting pulmonary arterial hypertension. Review of the MIP images confirms the above findings CTA HEAD Anterior circulation: Intracranial internal carotid  arteries are patent with mild atherosclerotic irregularity. Anterior and middle cerebral arteries are patent. Posterior circulation: Intracranial vertebral arteries, basilar artery, and posterior cerebral arteries are patent. PICA, AICA, and superior cerebellar artery origins are patent. There are patent bilateral posterior communicating arteries. Venous sinuses: Patent as allowed by contrast bolus timing. Review of the MIP images confirms the above findings IMPRESSION: No large vessel occlusion or hemodynamically significant stenosis. Electronically Signed   By: Guadlupe Spanish M.D.   On: 02/16/2020 22:02   CT HEAD WO CONTRAST  Result Date: 02/17/2020 CLINICAL DATA:  79 year old female code stroke presentation yesterday, right facial droop. Small age indeterminate left cerebellar SCA infarct on plain head CT. CTA negative for large vessel occlusion or stenosis. EXAM: CT HEAD WITHOUT CONTRAST TECHNIQUE: Contiguous axial images were obtained from the base of the skull through the vertex without intravenous contrast. COMPARISON:  CT head, CTA head and neck yesterday, and earlier. 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 No other intracranial mass effect. No midline shift. No ventriculomegaly. Stable gray-white matter differentiation throughout the brain. Small left SCA territory infarct appears unchanged. Largely normal for age gray-white differentiation elsewhere. No cortically based acute infarct identified. No acute intracranial hemorrhage identified. Vascular: Mild Calcified atherosclerosis at the skull base. Skull: No acute osseous abnormality identified. Hyperostosis of the calvarium. Sinuses/Orbits: Visualized paranasal sinuses and mastoids are stable and well pneumatized. Other: No acute orbit or scalp soft tissue findings. IMPRESSION: 1. Stable since yesterday. No cortically based infarct or acute intracranial hemorrhage identified. 2. Small left SCA territory  infarct, new since 2018 but favor chronic. Electronically Signed   By: [name] M.D.   On: 02/17/2020 12:45   CT Code Stroke CTA Neck W/WO contrast  Result Date: 02/16/2020 CLINICAL DATA:  Right facial droop EXAM: CT ANGIOGRAPHY HEAD AND NECK TECHNIQUE: Multidetector CT imaging of the head and neck was performed using the standard protocol during bolus administration of intravenous contrast. Multiplanar CT image reconstructions and MIPs were obtained to evaluate the vascular anatomy. Carotid stenosis measurements (when applicable) are obtained utilizing NASCET criteria, using the distal internal carotid diameter as the denominator. CONTRAST:  75 mL Omnipaque 350 COMPARISON:  None. FINDINGS: CTA NECK Aortic arch: Great vessel origins are patent. Right carotid system: Patent. Minimal calcified plaque at the ICA origin without measurable stenosis. Mild tortuosity of the distal cervical ICA. Left carotid system: Patent. Trace calcified plaque at the ICA origin without measurable stenosis. Mild tortuosity of the distal cervical ICA. Vertebral arteries: Patent.  No measurable stenosis. Skeleton: Cervical spine degenerative changes. Other neck: No mass or adenopathy. Upper chest: No apical lung mass.  Dilatation of the main pulmonary artery reflecting pulmonary arterial hypertension. Review of the MIP images confirms the above findings CTA HEAD Anterior circulation: Intracranial internal carotid arteries are patent with mild atherosclerotic irregularity. Anterior and middle cerebral arteries are patent. Posterior circulation: Intracranial vertebral arteries, basilar artery, and posterior cerebral arteries are patent. PICA, AICA, and superior cerebellar artery origins are patent. There are patent bilateral posterior communicating arteries. Venous sinuses: Patent as allowed by contrast bolus timing. Review of the MIP images confirms the above findings IMPRESSION: No large vessel occlusion or hemodynamically significant  stenosis. Electronically Signed   By: Guadlupe Spanish M.D.   On: 02/16/2020 22:02   MR BRAIN WO CONTRAST  Result Date: 02/17/2020 CLINICAL DATA:  Sudden onset of slurred speech and double vision EXAM: MRI HEAD WITHOUT CONTRAST TECHNIQUE: Multiplanar, multiecho pulse sequences of the brain and surrounding structures were obtained without intravenous contrast. COMPARISON:  02/02/2014 brain MRI FINDINGS: Brain: There is an acute infarct of the pons, left-greater-than-right. There is also punctate focus of acute ischemia in the left cerebellum. No acute ischemia of the midbrain or above. There is multifocal periventricular white matter hyperintensity, most often a result of chronic microvascular ischemia. Normal CSF volume. Small amount of hemosiderin at the inferior pons. Foci of chronic microhemorrhage in the left parietal and occipital lobes. Vascular: Normal flow voids. Skull and upper cervical spine: Normal marrow signal. Sinuses/Orbits: Negative. Other: None. IMPRESSION: 1. Acute bilateral pontine infarcts, left-greater-than-right. 2. Punctate focus of acute ischemia in the left cerebellum. 3. Findings of chronic microvascular ischemia. Electronically Signed   By: Deatra Robinson M.D.   On: 02/17/2020 22:45   CT HEAD CODE STROKE WO CONTRAST  Result Date: 02/16/2020 CLINICAL DATA:  Code stroke.  Right facial droop EXAM: CT HEAD WITHOUT CONTRAST TECHNIQUE: Contiguous axial images were obtained from the base of the skull through the vertex without intravenous contrast. COMPARISON:  2018 FINDINGS: Brain: There is no acute intracranial hemorrhage, mass effect, or edema. No new loss of gray-white differentiation. There is an age-indeterminate small infarct of the superior left cerebellum. Ventricles and sulci are normal in size and configuration. Patchy hypoattenuation in the supratentorial white matter is nonspecific but may reflect mild chronic microvascular ischemic changes. There is no extra-axial fluid  collection Vascular: There is no hyperdense vessel. Mild intracranial atherosclerotic calcification at the skull base. Skull: Unremarkable. Sinuses/Orbits: No acute abnormality. Other: Mastoid air cells are clear. ASPECTS ([place] Stroke Program Early CT Score) - Ganglionic level infarction (caudate, lentiform nuclei, internal capsule, insula, M1-M3 cortex): 7 - Supraganglionic infarction (M4-M6 cortex): 3 Total score (0-10 with 10 being normal): 10 IMPRESSION: There is no acute intracranial hemorrhage or evidence of definite acute infarction. ASPECT score is 10. New age-indeterminate small infarct of the superior left cerebellum. These results were communicated to Dr. Laurence Slate at 9:50 pmon 7/7/2021by text page via the [facility] messaging system. Electronically Signed   By: Guadlupe Spanish M.D.   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  Records reviewed and summated above.  1. Does the need for close, 24 hr/day medical supervision in concert with the patient's rehab needs make it unreasonable for this patient to be served  in a less intensive setting? Yes  2. Co-Morbidities requiring supervision/potential complications: HTN (monitor and provide prns in accordance with increased physical exertion and pain), hyperlipidemia, DVT (?  On no meds PTA),Takotsubo cardiomyopathy, obstructive sleep apnea (Cont CPAP, monitor for daytime somnolence), leukocytosis (repeat labs, cont to monitor for signs and symptoms of infection, further workup if indicated), dementia (continue meds) 3. Due to bowel management, safety, skin/wound care, disease  management, medication administration, pain management and patient education, does the patient require 24 hr/day rehab nursing? Yes 4. Does the patient require coordinated care of a physician, rehab nurse, therapy disciplines of PT/OT/SLP to address physical and functional deficits in the context of the above medical diagnosis(es)? Yes Addressing deficits in the following areas: balance, endurance, locomotion, strength, transferring, bathing, dressing, toileting, swallowing and psychosocial support 5. Can the patient actively participate in an intensive therapy program of at least 3 hrs of therapy per day at least 5 days per week? Yes 6. The potential for patient to make measurable gains while on inpatient rehab is excellent 7. Anticipated functional outcomes upon discharge from inpatient rehab are min assist  with PT, min assist with OT, independent with SLP. 8. Estimated rehab length of stay to reach the above functional goals is: 22-26 days. 9. Anticipated discharge destination: Home 10. Overall Rehab/Functional Prognosis: good  RECOMMENDATIONS: This patient's condition is appropriate for continued rehabilitative care in the following setting: CIR if adequate caregiver support available upon discharge. Patient has agreed to participate in recommended program. Yes Note that insurance prior authorization may be required for reimbursement for recommended care.  Comment: Rehab Admissions Coordinator to follow up.  I have personally performed a face to face diagnostic evaluation, including, but not limited to relevant history and physical exam findings, of this patient and developed relevant assessment and plan.  Additionally, I have reviewed and concur with the physician assistant's documentation above.   Maryla Morrow, MD, ABPMR Mcarthur Rossetti Angiulli, PA-C 02/18/2020

## 2020-02-18 NOTE — Plan of Care (Signed)
  Problem: Education: Goal: Knowledge of disease or condition will improve Outcome: Progressing Goal: Knowledge of secondary prevention will improve Outcome: Progressing   Problem: Coping: Goal: Will verbalize positive feelings about self Outcome: Progressing  Patient able to assist with turnin gin bed, verbalizes knowledge of neuro deficits and compensation strategies.

## 2020-02-18 NOTE — Progress Notes (Signed)
  Speech Language Pathology Treatment: Dysphagia  Patient Details Name: [name] MRN: [id number] DOB: 1940/11/23 Today's Date: 02/18/2020 Time: 0725-0736 SLP Time Calculation (min) (ACUTE ONLY): 11 min  Assessment / Plan / Recommendation Clinical Impression  Pt alert, lip swelling minimally improved. Pt is able to direct care, is 100% intelligible. After set up with food and utensils at midline pt was able to self feed despite visual deficits. Dys 2 is an appropriate texture given missing dentition and buccal weakness Pt is able to complete a lingual sweep independently to clear left side of mouth. No signs of aspiration. Recommend pt continue current diet to next level of care where she may benefit from SLP f/u if desired by pt and family. No further acute needs at this time.   HPI HPI: [name] is a 79 y.o. female with past medical history significant for hypertension, hyperlipidemia, DVT, TIA, PE, Takotsubo cardiomyopathy, obstructive sleep apnea presents to emergency department as a code stroke for sudden onset slurred speech, double vision. Patient received IV TPA after discussing risk versus benefit.  She subsequently worsened after IV TPA with worsening right-sided weakness, improved after laying head of bed flat and giving IV fluids. 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       SLP Plan  All goals met       Recommendations  Diet recommendations: Dysphagia 2 (fine chop);Thin liquid Liquids provided via: Cup;Straw Medication Administration: Whole meds with liquid Supervision: Patient able to self feed Compensations: Slow rate;Small sips/bites Postural Changes and/or Swallow Maneuvers: Seated upright 90 degrees                Plan: All goals met       GO               Herbie Baltimore,  MA CCC-SLP  Acute Rehabilitation Services Pager [phone] Office [phone]  Lynann Beaver 02/18/2020, 7:37 AM

## 2020-02-18 NOTE — Progress Notes (Signed)
  Echocardiogram 2D Echocardiogram has been attempted. Patient with PT. Will reattempt at later time.  Parissa Chiao G Nolita Kutter 02/18/2020, 10:32 AM

## 2020-02-18 NOTE — Progress Notes (Signed)
Inpatient Rehabilitation Admissions Coordinator  I will follow up Monday on pt's therapy progress and clarify caregiver support which will assist in determining rehab venue options.  Ottie Glazier, RN, MSN Rehab Admissions Coordinator [phone] 02/18/2020 12:18 PM

## 2020-02-18 NOTE — Progress Notes (Signed)
STROKE TEAM PROGRESS NOTE   INTERVAL HISTORY Patient is sitting up in bed.  Neurological exam remains unchanged with horizontal ophthalmoplegia with restriction of vertical movements as well.  She continues to have left facial weakness and mild right-sided weakness and incoordination.  MRI scan shows bilateral left greater than right pontine infarcts. 2D echo shows ejection fraction 55 to 60% with no wall motion abnormalities. Vitals:   02/18/20 0600 02/18/20 0700 02/18/20 0800 02/18/20 0900  BP: (!) 116/55 (!) 129/54 (!) 129/53 140/61  Pulse: (!) 45 (!) 44 (!) 48 (!) 52  Resp: 12 12 15 15   Temp:   99.1 F (37.3 C)   TempSrc:   Oral   SpO2: 94% 95% 98% 97%  Weight:       CBC:  Recent Labs  Lab 02/16/20 2142 02/16/20 2143  WBC 11.6*  --   NEUTROABS 9.9*  --   HGB 12.6 13.6  HCT 39.3 40.0  MCV 100.3*  --   PLT 166  --    Basic Metabolic Panel:  Recent Labs  Lab 02/16/20 2142 02/16/20 2143  NA 141 145  K 3.5 3.5  CL 104 103  CO2 26  --   GLUCOSE 133* 128*  BUN 8 11  CREATININE 0.88 0.80  CALCIUM 9.6  --    Lipid Panel:  Recent Labs  Lab 02/17/20 0350  CHOL 130  TRIG 51  HDL 50  CHOLHDL 2.6  VLDL 10  LDLCALC 70   HgbA1c:  Recent Labs  Lab 02/17/20 0350  HGBA1C 5.5   Urine Drug Screen: No results for input(s): LABOPIA, COCAINSCRNUR, LABBENZ, AMPHETMU, THCU, LABBARB in the last 168 hours.  Alcohol Level No results for input(s): ETH in the last 168 hours.  IMAGING past 24 hours CT HEAD WO CONTRAST  Result Date: 02/17/2020 CLINICAL DATA:  79 year old female code stroke presentation yesterday, right facial droop. Small age indeterminate left cerebellar SCA infarct on plain head CT. CTA negative for large vessel occlusion or stenosis. EXAM: CT HEAD WITHOUT CONTRAST TECHNIQUE: Contiguous axial images were obtained from the base of the skull through the vertex without intravenous contrast. COMPARISON:  CT head, CTA head and neck yesterday, and earlier. 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 No other intracranial mass effect. No midline shift. No ventriculomegaly. Stable gray-white matter differentiation throughout the brain. Small left SCA territory infarct appears unchanged. Largely normal for age gray-white differentiation elsewhere. No cortically based acute infarct identified. No acute intracranial hemorrhage identified. Vascular: Mild Calcified atherosclerosis at the skull base. Skull: No acute osseous abnormality identified. Hyperostosis of the calvarium. Sinuses/Orbits: Visualized paranasal sinuses and mastoids are stable and well pneumatized. Other: No acute orbit or scalp soft tissue findings. IMPRESSION: 1. Stable since yesterday. No cortically based infarct or acute intracranial hemorrhage identified. 2. Small left SCA territory infarct, new since 2018 but favor chronic. Electronically Signed   By: 2019 M.D.   On: 02/17/2020 12:45   MR BRAIN WO CONTRAST  Result Date: 02/17/2020 CLINICAL DATA:  Sudden onset of slurred speech and double vision EXAM: MRI HEAD WITHOUT CONTRAST TECHNIQUE: Multiplanar, multiecho pulse sequences of the brain and surrounding structures were obtained without intravenous contrast. COMPARISON:  02/02/2014 brain MRI FINDINGS: Brain: There is an acute infarct of the pons, left-greater-than-right. There is also punctate focus of acute ischemia in the left cerebellum. No acute ischemia of the midbrain or above. There is multifocal periventricular white matter hyperintensity, most often a result of  chronic microvascular ischemia. Normal CSF volume. Small amount of hemosiderin at the inferior pons. Foci of chronic microhemorrhage in the left parietal and occipital lobes. Vascular: Normal flow voids. Skull and upper cervical spine: Normal marrow signal. Sinuses/Orbits: Negative. Other: None. IMPRESSION: 1. Acute bilateral pontine infarcts, left-greater-than-right. 2. Punctate focus of acute  ischemia in the left cerebellum. 3. Findings of chronic microvascular ischemia. Electronically Signed   By: Deatra Robinson M.D.   On: 02/17/2020 22:45    PHYSICAL EXAM   Pleasant elderly obese African-American lady not in distress. . Afebrile. Head is nontraumatic. Neck is supple without bruit.    Cardiac exam no murmur or gallop. Lungs are clear to auscultation. Distal pulses are well felt.  She has mild left lower lip swelling Neurological Exam :  Awake alert oriented to time place and person.  Mild dysarthria.  No aphasia.  Extraocular movements are significantly affected with bilateral horizontal gaze palsy.  There is only trace movements of both eyes when looking to the right of the left.  The left eye has good vertical up-and-down gaze in the right eye and sluggish but preserved up-and-down gaze.  No nystagmus..  Blinks to threat bilaterally.  Moderate left lower face and mild left upper face weakness.  Tongue midline.  Motor system exam shows no upper or lower extremity drift but diminished fine finger movements on the right and orbits left over right upper extremity.  Mild bilateral lower extremity proximal weakness right greater than left.  Sensation appears intact.  Gait not tested.   ASSESSMENT/PLAN [name] is a 79 y.o. female with history of hypertension, hyperlipidemia, DVT, TIA, PE, Takotsubo cardiomyopathy, obstructive sleep apnea presenting with Left facial droop, double vision, slurred speech, right side weakness. Received tPA 02/16/2020 at 2142. Worsened post tIA w/ worsened R hemiparesis which improved w/ laying flat and fluids.   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 Age indeterminate L cerebellar infarct. Small vessel disease. Atrophy. ASPECTS 10.     CTA head & neck no LVO or significant stenosis   CT head w/ increased lethargy stable. No acute infarct. likely old small SCA  territory infarct new since 2018  MRI  L>R pontine infarcts. Punctate L cerebellar infarct.  2D Echo 12/2019 - EF 55-60%. No source of embolus. LA dilated. No need to repeat. canceled   LDL 70  HgbA1c 5.5  A [facility] electrophysiologist will consult and consider placement of an implantable loop recorder to evaluate for atrial fibrillation as etiology of stroke. This has been explained to patient/family by Dr. Pearlean Brownie and they are agreeable. I consulted and they will likely see on Monday.  VTE prophylaxis - Lovenox 40 mg sq daily    aspirin 81 mg daily prior to admission, now on aspirin 325 mg daily. Decrease aspirin to 81 and add plavix. Continue DAPT x 3 weeks then plavix alone   Therapy recommendations:  CIR  Disposition:  pending   xfer to floor  Post tPA lower lip swelling  Immediately occurring after tPA  Treated w/ pepcid, Benadryl and solumedrol  Added to allergy profile  Hypertension  Home meds:  Lasix 40 prn, amlodipine 10, HCTZ 25  Stable SBP goal < 180 . Resumed scheduled home meds .  Long-term BP goal normotensive  Hyperlipidemia  Home meds:  liptior 20, resumed in hospital  LDL 70, goal < 70  Will  hold on more intensive statin given LDL at goal  Continue statin at discharge  Other Stroke Risk Factors  Advanced age  Former Cigarette smoker  Obesity, Body mass index is 35.56 kg/m., recommend weight loss, diet and exercise as appropriate   Hx stroke/TIA - no documented hx stroke in EPIC  Obstructive sleep apnea, on CPAP at home  Takotsubo cardiomyopathy  Hx DVT   Other Active Problems  Baseline dementia on aricept and namenda since, memory probs since 2013  Leukocytosis, WBC 11.6 monitor  Hospital day # 2  Patient presented with bilateral pontine infarcts likely of embolic etiology.  Recommend aspirin Plavix for 3 weeks followed by Plavix alone.  Mobilize out of bed.  Therapy consults.  Rehab consults.  Loop  recorder at the time of discharge for paroxysmal A. fib.  Transfer to neurology floor bed when available.  Discussed with patient and son over the phone and answered questions.  Greater than 50% time during this 35-minute visit was spent on counseling and coordination of care about her stroke and answering questions.  Delia Heady, MD  To contact Stroke Continuity provider, please refer to [email or web address]. After hours, contact General Neurology

## 2020-02-19 DIAGNOSIS — I6312 Cerebral infarction due to embolism of basilar artery: Secondary | ICD-10-CM

## 2020-02-19 DIAGNOSIS — F0390 Unspecified dementia without behavioral disturbance: Secondary | ICD-10-CM

## 2020-02-19 LAB — CBC
HCT: 37.4 % (ref 36.0–46.0)
Hemoglobin: 12.2 g/dL (ref 12.0–15.0)
MCH: 31.9 pg (ref 26.0–34.0)
MCHC: 32.6 g/dL (ref 30.0–36.0)
MCV: 97.7 fL (ref 80.0–100.0)
Platelets: 180 10*3/uL (ref 150–400)
RBC: 3.83 MIL/uL — ABNORMAL LOW (ref 3.87–5.11)
RDW: 12.8 % (ref 11.5–15.5)
WBC: 9.3 10*3/uL (ref 4.0–10.5)
nRBC: 0 % (ref 0.0–0.2)

## 2020-02-19 LAB — BASIC METABOLIC PANEL
Anion gap: 10 (ref 5–15)
BUN: 9 mg/dL (ref 8–23)
CO2: 25 mmol/L (ref 22–32)
Calcium: 9.2 mg/dL (ref 8.9–10.3)
Chloride: 104 mmol/L (ref 98–111)
Creatinine, Ser: 0.86 mg/dL (ref 0.44–1.00)
GFR calc Af Amer: >60 mL/min (ref >60)
GFR calc non Af Amer: >60 mL/min (ref >60)
Glucose, Bld: 89 mg/dL (ref 70–99)
Potassium: 3.5 mmol/L (ref 3.5–5.1)
Sodium: 139 mmol/L (ref 135–145)

## 2020-02-19 MED ORDER — MELATONIN 3 MG PO TABS
3.0000 mg | ORAL_TABLET | Freq: Every day | ORAL | Status: DC
  Administered 2020-02-19 – 2020-02-20 (×3): 3 mg via ORAL
  Filled 2020-02-19 (×3): qty 1

## 2020-02-19 MED ORDER — ARTIFICIAL TEARS OPHTHALMIC OINT
TOPICAL_OINTMENT | Freq: Every day | OPHTHALMIC | Status: DC
  Filled 2020-02-19: qty 3.5

## 2020-02-19 MED ORDER — HYPROMELLOSE (GONIOSCOPIC) 2.5 % OP SOLN
2.0000 [drp] | Freq: Four times a day (QID) | OPHTHALMIC | Status: DC
  Filled 2020-02-19: qty 15

## 2020-02-19 MED ORDER — MIRTAZAPINE 15 MG PO TABS
7.5000 mg | ORAL_TABLET | ORAL | Status: DC | PRN
  Administered 2020-02-19: 7.5 mg via ORAL
  Filled 2020-02-19: qty 1

## 2020-02-19 MED ORDER — POLYVINYL ALCOHOL 1.4 % OP SOLN
2.0000 [drp] | Freq: Four times a day (QID) | OPHTHALMIC | Status: DC
  Administered 2020-02-19 – 2020-02-21 (×9): 2 [drp] via OPHTHALMIC
  Filled 2020-02-19: qty 15

## 2020-02-19 MED ORDER — LISINOPRIL 20 MG PO TABS
20.0000 mg | ORAL_TABLET | Freq: Every day | ORAL | Status: DC
  Administered 2020-02-20: 20 mg via ORAL
  Filled 2020-02-19: qty 1

## 2020-02-19 NOTE — Progress Notes (Signed)
Paged on call Dr. Laurence Slate several times due to patient requesting sleeping medication; no new orders

## 2020-02-19 NOTE — Progress Notes (Signed)
STROKE TEAM PROGRESS NOTE   INTERVAL HISTORY Patient is lying in bed. Still has left peripheral CN VII palsy, not able to close left eye. Complaining left eye pain likely due to corneal exposure with left  watery eyes. Will need eye drop and cream to protect corneal. Still has limited eye movement on the left. Right UE weakness with some improvement. RLE good strength.   Vitals:   02/18/20 1748 02/18/20 1950 02/18/20 2320 02/19/20 0345  BP: (!) 134/56 133/61 137/60 132/63  Pulse:  (!) 55 (!) 50 (!) 58  Resp:  18 18 18   Temp: 98.3 F (36.8 C) 98.1 F (36.7 C) 98.4 F (36.9 C) 98.1 F (36.7 C)  TempSrc: Oral Oral Oral Oral  SpO2: 98% 98% 98% 99%  Weight:       CBC:  Recent Labs  Lab 02/16/20 2142 02/16/20 2143  WBC 11.6*  --   NEUTROABS 9.9*  --   HGB 12.6 13.6  HCT 39.3 40.0  MCV 100.3*  --   PLT 166  --    Basic Metabolic Panel:  Recent Labs  Lab 02/16/20 2142 02/16/20 2143  NA 141 145  K 3.5 3.5  CL 104 103  CO2 26  --   GLUCOSE 133* 128*  BUN 8 11  CREATININE 0.88 0.80  CALCIUM 9.6  --    Lipid Panel:  Recent Labs  Lab 02/17/20 0350  CHOL 130  TRIG 51  HDL 50  CHOLHDL 2.6  VLDL 10  LDLCALC 70   HgbA1c:  Recent Labs  Lab 02/17/20 0350  HGBA1C 5.5   Urine Drug Screen: No results for input(s): LABOPIA, COCAINSCRNUR, LABBENZ, AMPHETMU, THCU, LABBARB in the last 168 hours.  Alcohol Level No results for input(s): ETH in the last 168 hours.  IMAGING past 24 hours No results found.  PHYSICAL EXAM    Temp:  [98.1 F (36.7 C)-99.7 F (37.6 C)] 99.7 F (37.6 C) (07/10 1200) Pulse Rate:  [46-58] 46 (07/10 1200) Resp:  [18-20] 18 (07/10 1200) BP: (109-137)/(51-63) 109/51 (07/10 1200) SpO2:  [94 %-100 %] 94 % (07/10 1200)  General - Well nourished, well developed, in no apparent distress.  Ophthalmologic - fundi not visualized due to noncooperation.  Cardiovascular - Regular rhythm and rate.  Mental Status -  Level of arousal and orientation  to time, place, and person were intact. Language including expression, naming, repetition, comprehension was assessed and found intact. However, mild to moderate dysarthria  Cranial Nerves II - XII - II - Visual field intact OU. III, IV, VI - Extraocular movements showed left eye only limited movement upward and downward, right eye left gaze palsy.  V - Facial sensation intact bilaterally. VII - upper and lower facial weakness on the left VIII - Hearing & vestibular intact bilaterally. X - Palate elevates symmetrically. XI - Chin turning & shoulder shrug intact bilaterally. XII - Tongue protrusion intact.  Motor Strength - The patient's strength was symmetrical in BLEs, 4/5 LUE however, 3/5 RUE proximal, bicep and tricep, finger grip 4/5. Bulk was normal and fasciculations were absent.   Motor Tone - Muscle tone was assessed at the neck and appendages and was normal.  Reflexes - The patient's reflexes were symmetrical in all extremities and she had no pathological reflexes.  Sensory - Light touch, temperature/pinprick were assessed and were symmetrical.    Coordination - The patient had normal movements in the left hand with no ataxia or dysmetria. However, right UE FTN ataxia, still proportional  to the weakness. Tremor was absent.  Gait and Station - deferred.   ASSESSMENT/PLAN [name] is a 79 y.o. female with history of hypertension, hyperlipidemia, DVT, TIA, PE, Takotsubo cardiomyopathy, obstructive sleep apnea presenting with Left facial droop, double vision, slurred speech, right side weakness. Received tPA 02/16/2020 at 2142. Worsened post tIA w/ worsened R hemiparesis which improved w/ laying flat and fluids.   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 Age indeterminate L cerebellar infarct.      CTA head & neck no LVO or significant stenosis   CT head repeat 2/2 increased  lethargy - stable. No acute infarct. likely old small SCA territory infarct new since 2018  MRI  L>R pontine infarcts. Punctate L cerebellar infarct.  2D Echo 12/2019 - EF 55-60%. No source of embolus. LA dilated.   LE venous doppler pending  LDL 70  HgbA1c 5.5  Loop recorder pending on Monday.  VTE prophylaxis - Lovenox 40 mg sq daily    aspirin 81 mg daily prior to admission, now on aspirin to 81 and plavix. Continue DAPT x 3 weeks then plavix alone   Therapy recommendations:  CIR  Disposition:  pending   Post tPA lower lip swelling  Immediately occurring after tPA  Treated w/ pepcid, Benadryl and solumedrol  Added to allergy profile  Left eye pain  Due to left peripheral CN VII palsy  Watery eyes due to difficulty with eye closure  Artificial tears Q3h while awake  Artifical tears ointment Qhs   Hypertension  Home meds:  Lasix 40 prn, amlodipine 10, HCTZ 25  Stable on the low end  SBP goal < 180 . Now on lisinopril 20 . D/c norvasc and HCTZ due to relative low BP . Long-term BP goal normotensive  Hyperlipidemia  Home meds:  liptior 20, resumed in hospital  LDL 70, goal < 70  Will hold on more intensive statin given LDL at goal  Continue statin at discharge  Other Stroke Risk Factors  Advanced age  Former Cigarette smoker  Obesity, Body mass index is 35.56 kg/m., recommend weight loss, diet and exercise as appropriate   Obstructive sleep apnea, on CPAP at home  Takotsubo cardiomyopathy  Hx DVT   Other Active Problems  Baseline dementia on aricept and namenda, memory probs since 2013  Leukocytosis, WBC 11.6->9.3  Hospital day # 3  Marvel Plan, MD PhD Stroke Neurology 02/19/2020 2:37 PM   To contact Stroke Continuity provider, please refer to [email or web address]. After hours, contact General Neurology

## 2020-02-20 ENCOUNTER — Inpatient Hospital Stay (HOSPITAL_COMMUNITY): Payer: Medicare Other

## 2020-02-20 DIAGNOSIS — I639 Cerebral infarction, unspecified: Secondary | ICD-10-CM

## 2020-02-20 LAB — CBC
HCT: 37.2 % (ref 36.0–46.0)
Hemoglobin: 12 g/dL (ref 12.0–15.0)
MCH: 31.7 pg (ref 26.0–34.0)
MCHC: 32.3 g/dL (ref 30.0–36.0)
MCV: 98.4 fL (ref 80.0–100.0)
Platelets: 163 10*3/uL (ref 150–400)
RBC: 3.78 MIL/uL — ABNORMAL LOW (ref 3.87–5.11)
RDW: 12.7 % (ref 11.5–15.5)
WBC: 8.3 10*3/uL (ref 4.0–10.5)
nRBC: 0 % (ref 0.0–0.2)

## 2020-02-20 LAB — BASIC METABOLIC PANEL
Anion gap: 12 (ref 5–15)
BUN: 11 mg/dL (ref 8–23)
CO2: 24 mmol/L (ref 22–32)
Calcium: 9.3 mg/dL (ref 8.9–10.3)
Chloride: 104 mmol/L (ref 98–111)
Creatinine, Ser: 0.84 mg/dL (ref 0.44–1.00)
GFR calc Af Amer: >60 mL/min (ref >60)
GFR calc non Af Amer: >60 mL/min (ref >60)
Glucose, Bld: 86 mg/dL (ref 70–99)
Potassium: 3.5 mmol/L (ref 3.5–5.1)
Sodium: 140 mmol/L (ref 135–145)

## 2020-02-20 MED ORDER — LISINOPRIL 10 MG PO TABS
10.0000 mg | ORAL_TABLET | Freq: Every day | ORAL | Status: DC
  Administered 2020-02-21: 10 mg via ORAL
  Filled 2020-02-20: qty 4

## 2020-02-20 NOTE — Progress Notes (Signed)
STROKE TEAM PROGRESS NOTE   INTERVAL HISTORY No family at bedside. Pt lying in bed, initially sleepy but easily arousable. Stated that right eye pain discomfort improved some. Able to eat half of her lunch. Pending CIR.   Vitals:   02/20/20 0343 02/20/20 0347 02/20/20 0800 02/20/20 1330  BP: 129/61 129/60  (!) 117/47  Pulse: (!) 50 (!) 52  (!) 46  Resp: 12 13 20 12   Temp: 98.7 F (37.1 C) 98.7 F (37.1 C)  97.9 F (36.6 C)  TempSrc: Oral   Axillary  SpO2: 94% 94%  96%  Weight:       CBC:  Recent Labs  Lab 02/16/20 2142 02/16/20 2143 02/19/20 0451 02/20/20 0441  WBC 11.6*   < > 9.3 8.3  NEUTROABS 9.9*  --   --   --   HGB 12.6   < > 12.2 12.0  HCT 39.3   < > 37.4 37.2  MCV 100.3*   < > 97.7 98.4  PLT 166   < > 180 163   < > = values in this interval not displayed.   Basic Metabolic Panel:  Recent Labs  Lab 02/19/20 0451 02/20/20 0441  NA 139 140  K 3.5 3.5  CL 104 104  CO2 25 24  GLUCOSE 89 86  BUN 9 11  CREATININE 0.86 0.84  CALCIUM 9.2 9.3   Lipid Panel:  Recent Labs  Lab 02/17/20 0350  CHOL 130  TRIG 51  HDL 50  CHOLHDL 2.6  VLDL 10  LDLCALC 70   HgbA1c:  Recent Labs  Lab 02/17/20 0350  HGBA1C 5.5   Urine Drug Screen: No results for input(s): LABOPIA, COCAINSCRNUR, LABBENZ, AMPHETMU, THCU, LABBARB in the last 168 hours.  Alcohol Level No results for input(s): ETH in the last 168 hours.  IMAGING past 24 hours  VAS 04/19/20 LOWER EXTREMITY VENOUS (DVT)  Result Date: 02/20/2020  Lower Venous DVT Study Indications: Stroke.  Limitations: Postioning secondary to stroke. Comparison Study: Prior study from 09/30/16 is available for comparison Performing Technologist: 10/02/16 RVS  Examination Guidelines: A complete evaluation includes B-mode imaging, spectral Doppler, color Doppler, and power Doppler as needed of all accessible portions of each vessel. Bilateral testing is considered an integral part of a complete examination. Limited examinations for  reoccurring indications may be performed as noted. The reflux portion of the exam is performed with the patient in reverse Trendelenburg.  +---------+---------------+---------+-----------+----------+--------------+ RIGHT    CompressibilityPhasicitySpontaneityPropertiesThrombus Aging +---------+---------------+---------+-----------+----------+--------------+ CFV      Full           Yes      Yes                                 +---------+---------------+---------+-----------+----------+--------------+ SFJ      Full                                                        +---------+---------------+---------+-----------+----------+--------------+ FV Prox  Full                                                        +---------+---------------+---------+-----------+----------+--------------+  FV Mid   Full                                                        +---------+---------------+---------+-----------+----------+--------------+ FV DistalFull                                                        +---------+---------------+---------+-----------+----------+--------------+ PFV      Full                                                        +---------+---------------+---------+-----------+----------+--------------+ POP      Full           Yes      Yes                                 +---------+---------------+---------+-----------+----------+--------------+ PTV      Full                                                        +---------+---------------+---------+-----------+----------+--------------+ PERO     Full                                                        +---------+---------------+---------+-----------+----------+--------------+   +---------+---------------+---------+-----------+----------+--------------+ LEFT     CompressibilityPhasicitySpontaneityPropertiesThrombus Aging  +---------+---------------+---------+-----------+----------+--------------+ CFV      Full           Yes      Yes                                 +---------+---------------+---------+-----------+----------+--------------+ SFJ      Full                                                        +---------+---------------+---------+-----------+----------+--------------+ FV Prox  Full                                                        +---------+---------------+---------+-----------+----------+--------------+ FV Mid   Full                                                        +---------+---------------+---------+-----------+----------+--------------+   FV DistalFull                                                        +---------+---------------+---------+-----------+----------+--------------+ PFV      Full                                                        +---------+---------------+---------+-----------+----------+--------------+ POP      Full           Yes      Yes                                 +---------+---------------+---------+-----------+----------+--------------+ PTV      Full                                                        +---------+---------------+---------+-----------+----------+--------------+ PERO     Full                                                        +---------+---------------+---------+-----------+----------+--------------+   Left Technical Findings: Not visualized segments include portions of the posterior tibial and peroneal veins.   Summary: RIGHT: - Findings appear essentially unchanged compared to previous examination. - There is no evidence of deep vein thrombosis in the lower extremity.  LEFT: - Findings appear essentially unchanged compared to previous examination. - There is no evidence of deep vein thrombosis in the lower extremity. However, portions of this examination were limited- see technologist comments  above.  *See table(s) above for measurements and observations.    Preliminary     PHYSICAL EXAM  Temp:  [97.9 F (36.6 C)-98.7 F (37.1 C)] 97.9 F (36.6 C) (07/11 1330) Pulse Rate:  [46-68] 46 (07/11 1330) Resp:  [12-20] 12 (07/11 1330) BP: (105-129)/(47-61) 117/47 (07/11 1330) SpO2:  [92 %-99 %] 96 % (07/11 1330)  General - Well nourished, well developed, in no apparent distress.  Ophthalmologic - fundi not visualized due to noncooperation.  Cardiovascular - Regular rhythm and rate.  Mental Status -  Initially sleeping but easily arousable and orientation to time, place, and person were intact. Language including expression, naming, repetition, comprehension was assessed and found intact. However, mild to moderate dysarthria  Cranial Nerves II - XII - II - Visual field intact OU. III, IV, VI - Extraocular movements showed left eye only limited movement upward and downward, right eye left gaze palsy.  V - Facial sensation intact bilaterally. VII - upper and lower facial weakness on the left VIII - Hearing & vestibular intact bilaterally. X - Palate elevates symmetrically. XI - Chin turning & shoulder shrug intact bilaterally. XII - Tongue protrusion intact.  Motor Strength - The patient's strength was symmetrical in BLEs, 4/5 LUE however, 3/5 RUE proximal, bicep and  tricep, finger grip 4/5. Bulk was normal and fasciculations were absent.   Motor Tone - Muscle tone was assessed at the neck and appendages and was normal.  Reflexes - The patient's reflexes were symmetrical in all extremities and she had no pathological reflexes.  Sensory - Light touch, temperature/pinprick were assessed and were symmetrical.    Coordination - The patient had normal movements in the left hand with no ataxia or dysmetria. However, right UE FTN ataxia, still proportional to the weakness. Tremor was absent.  Gait and Station - deferred.   ASSESSMENT/PLAN [name] is a 79 y.o.  female with history of hypertension, hyperlipidemia, DVT, TIA, PE, Takotsubo cardiomyopathy, obstructive sleep apnea presenting with Left facial droop, double vision, slurred speech, right side weakness. Received tPA 02/16/2020 at 2142. Worsened post tIA w/ worsened R hemiparesis which improved w/ laying flat and fluids.   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 Age indeterminate L cerebellar infarct.      CTA head & neck no LVO or significant stenosis   CT head repeat 2/2 increased lethargy - stable. No acute infarct. likely old small SCA territory infarct new since 2018  MRI  L>R pontine infarcts. Punctate L cerebellar infarct.  2D Echo 12/2019 - EF 55-60%. No source of embolus. LA dilated.   LE venous doppler - negative for DVT bilaterally  LDL 70  HgbA1c 5.5  Loop recorder pending on Monday.  VTE prophylaxis - Lovenox 40 mg sq daily    aspirin 81 mg daily prior to admission, now on aspirin to 81 and plavix. Continue DAPT x 3 weeks then plavix alone   Therapy recommendations:  CIR  Disposition:  pending   Post tPA lower lip swelling  Immediately occurring after tPA  Treated w/ pepcid, Benadryl and solumedrol  Added to allergy profile  Left eye pain, improving  Due to left peripheral CN VII palsy  Watery eyes due to difficulty with eye closure  Artificial tears Q3h while awake  Artifical tears ointment Qhs   Hypertension  Home meds:  Lasix 40 prn, amlodipine 10, HCTZ 25  Stable on the low end  SBP goal < 180 . Now on lisinopril 20 -> 10 . D/c norvasc and HCTZ due to relative low BP . Long-term BP goal normotensive  Hyperlipidemia  Home meds:  liptior 20, resumed in hospital  LDL 70, goal < 70  Will hold on more intensive statin given LDL at goal  Continue statin at discharge  Other Stroke Risk Factors  Advanced age  Former Cigarette smoker  Obesity, Body  mass index is 35.56 kg/m., recommend weight loss, diet and exercise as appropriate   Obstructive sleep apnea, on CPAP at home  Takotsubo cardiomyopathy  Hx DVT   Other Active Problems  Baseline dementia on aricept and namenda, memory probs since 2013  Leukocytosis, WBC 11.6->9.3->8.3  Bradycardia - 40's [facility] day # 4  Marvel Plan, MD PhD Stroke Neurology 02/20/2020 3:44 PM   To contact Stroke Continuity provider, please refer to [email or web address]. After hours, contact General Neurology

## 2020-02-20 NOTE — Progress Notes (Signed)
VASCULAR LAB    Bilateral lower extremity venous duplex completed.    Preliminary report:  See CV proc for preliminary results.  Jaylun Fleener, RVT 02/20/2020, 11:02 AM

## 2020-02-21 ENCOUNTER — Inpatient Hospital Stay (HOSPITAL_COMMUNITY)
Admission: RE | Admit: 2020-02-21 | Discharge: 2020-03-12 | DRG: 057 | Disposition: A | Discharge status: Home Health | Payer: Medicare Other | Source: Intra-hospital | Attending: Physical Medicine & Rehabilitation | Admitting: Physical Medicine & Rehabilitation

## 2020-02-21 ENCOUNTER — Encounter (HOSPITAL_COMMUNITY): Payer: Self-pay | Admitting: Physical Medicine & Rehabilitation

## 2020-02-21 ENCOUNTER — Encounter (HOSPITAL_COMMUNITY)
Admission: EM | Disposition: A | Discharge status: Rehabilitation Facility | Payer: Self-pay | Source: Home / Self Care | Attending: Neurology

## 2020-02-21 ENCOUNTER — Other Ambulatory Visit: Payer: Self-pay

## 2020-02-21 DIAGNOSIS — I5181 Takotsubo syndrome: Secondary | ICD-10-CM | POA: Diagnosis present

## 2020-02-21 DIAGNOSIS — K5901 Slow transit constipation: Secondary | ICD-10-CM | POA: Diagnosis not present

## 2020-02-21 DIAGNOSIS — N179 Acute kidney failure, unspecified: Secondary | ICD-10-CM

## 2020-02-21 DIAGNOSIS — I69322 Dysarthria following cerebral infarction: Secondary | ICD-10-CM

## 2020-02-21 DIAGNOSIS — Z79890 Hormone replacement therapy (postmenopausal): Secondary | ICD-10-CM

## 2020-02-21 DIAGNOSIS — I6350 Cerebral infarction due to unspecified occlusion or stenosis of unspecified cerebral artery: Secondary | ICD-10-CM | POA: Diagnosis not present

## 2020-02-21 DIAGNOSIS — E8809 Other disorders of plasma-protein metabolism, not elsewhere classified: Secondary | ICD-10-CM | POA: Diagnosis not present

## 2020-02-21 DIAGNOSIS — G510 Bell's palsy: Secondary | ICD-10-CM | POA: Diagnosis present

## 2020-02-21 DIAGNOSIS — R0989 Other specified symptoms and signs involving the circulatory and respiratory systems: Secondary | ICD-10-CM | POA: Diagnosis present

## 2020-02-21 DIAGNOSIS — I952 Hypotension due to drugs: Secondary | ICD-10-CM | POA: Diagnosis present

## 2020-02-21 DIAGNOSIS — Z87891 Personal history of nicotine dependence: Secondary | ICD-10-CM | POA: Diagnosis not present

## 2020-02-21 DIAGNOSIS — E785 Hyperlipidemia, unspecified: Secondary | ICD-10-CM | POA: Diagnosis present

## 2020-02-21 DIAGNOSIS — M1990 Unspecified osteoarthritis, unspecified site: Secondary | ICD-10-CM | POA: Diagnosis present

## 2020-02-21 DIAGNOSIS — R413 Other amnesia: Secondary | ICD-10-CM | POA: Diagnosis present

## 2020-02-21 DIAGNOSIS — Z79899 Other long term (current) drug therapy: Secondary | ICD-10-CM | POA: Diagnosis not present

## 2020-02-21 DIAGNOSIS — N182 Chronic kidney disease, stage 2 (mild): Secondary | ICD-10-CM

## 2020-02-21 DIAGNOSIS — Z6835 Body mass index (BMI) 35.0-35.9, adult: Secondary | ICD-10-CM

## 2020-02-21 DIAGNOSIS — I69391 Dysphagia following cerebral infarction: Secondary | ICD-10-CM

## 2020-02-21 DIAGNOSIS — Z7982 Long term (current) use of aspirin: Secondary | ICD-10-CM

## 2020-02-21 DIAGNOSIS — G4733 Obstructive sleep apnea (adult) (pediatric): Secondary | ICD-10-CM | POA: Diagnosis present

## 2020-02-21 DIAGNOSIS — T7840XA Allergy, unspecified, initial encounter: Secondary | ICD-10-CM | POA: Diagnosis not present

## 2020-02-21 DIAGNOSIS — I440 Atrioventricular block, first degree: Secondary | ICD-10-CM | POA: Diagnosis not present

## 2020-02-21 DIAGNOSIS — K5903 Drug induced constipation: Secondary | ICD-10-CM | POA: Diagnosis not present

## 2020-02-21 DIAGNOSIS — R1310 Dysphagia, unspecified: Secondary | ICD-10-CM | POA: Diagnosis present

## 2020-02-21 DIAGNOSIS — I10 Essential (primary) hypertension: Secondary | ICD-10-CM

## 2020-02-21 DIAGNOSIS — Z9989 Dependence on other enabling machines and devices: Secondary | ICD-10-CM

## 2020-02-21 DIAGNOSIS — Z888 Allergy status to other drugs, medicaments and biological substances status: Secondary | ICD-10-CM | POA: Diagnosis not present

## 2020-02-21 DIAGNOSIS — R001 Bradycardia, unspecified: Secondary | ICD-10-CM | POA: Diagnosis not present

## 2020-02-21 DIAGNOSIS — I639 Cerebral infarction, unspecified: Secondary | ICD-10-CM

## 2020-02-21 DIAGNOSIS — N1831 Chronic kidney disease, stage 3a: Secondary | ICD-10-CM | POA: Diagnosis present

## 2020-02-21 DIAGNOSIS — Z885 Allergy status to narcotic agent status: Secondary | ICD-10-CM | POA: Diagnosis not present

## 2020-02-21 DIAGNOSIS — I6312 Cerebral infarction due to embolism of basilar artery: Secondary | ICD-10-CM | POA: Diagnosis not present

## 2020-02-21 DIAGNOSIS — M549 Dorsalgia, unspecified: Secondary | ICD-10-CM | POA: Diagnosis present

## 2020-02-21 DIAGNOSIS — F329 Major depressive disorder, single episode, unspecified: Secondary | ICD-10-CM | POA: Diagnosis present

## 2020-02-21 DIAGNOSIS — G479 Sleep disorder, unspecified: Secondary | ICD-10-CM

## 2020-02-21 DIAGNOSIS — E441 Mild protein-calorie malnutrition: Secondary | ICD-10-CM | POA: Diagnosis present

## 2020-02-21 DIAGNOSIS — E669 Obesity, unspecified: Secondary | ICD-10-CM | POA: Diagnosis present

## 2020-02-21 DIAGNOSIS — G4700 Insomnia, unspecified: Secondary | ICD-10-CM | POA: Diagnosis present

## 2020-02-21 DIAGNOSIS — I69351 Hemiplegia and hemiparesis following cerebral infarction affecting right dominant side: Principal | ICD-10-CM

## 2020-02-21 DIAGNOSIS — Z96652 Presence of left artificial knee joint: Secondary | ICD-10-CM | POA: Diagnosis present

## 2020-02-21 DIAGNOSIS — Z86718 Personal history of other venous thrombosis and embolism: Secondary | ICD-10-CM

## 2020-02-21 DIAGNOSIS — Z86711 Personal history of pulmonary embolism: Secondary | ICD-10-CM

## 2020-02-21 DIAGNOSIS — I6300 Cerebral infarction due to thrombosis of unspecified precerebral artery: Secondary | ICD-10-CM | POA: Diagnosis not present

## 2020-02-21 DIAGNOSIS — Z7902 Long term (current) use of antithrombotics/antiplatelets: Secondary | ICD-10-CM

## 2020-02-21 DIAGNOSIS — G8929 Other chronic pain: Secondary | ICD-10-CM | POA: Diagnosis present

## 2020-02-21 DIAGNOSIS — I129 Hypertensive chronic kidney disease with stage 1 through stage 4 chronic kidney disease, or unspecified chronic kidney disease: Secondary | ICD-10-CM | POA: Diagnosis present

## 2020-02-21 DIAGNOSIS — I69392 Facial weakness following cerebral infarction: Secondary | ICD-10-CM

## 2020-02-21 DIAGNOSIS — D539 Nutritional anemia, unspecified: Secondary | ICD-10-CM | POA: Diagnosis present

## 2020-02-21 DIAGNOSIS — F0390 Unspecified dementia without behavioral disturbance: Secondary | ICD-10-CM | POA: Diagnosis present

## 2020-02-21 DIAGNOSIS — Z8679 Personal history of other diseases of the circulatory system: Secondary | ICD-10-CM

## 2020-02-21 DIAGNOSIS — E876 Hypokalemia: Secondary | ICD-10-CM

## 2020-02-21 HISTORY — PX: LOOP RECORDER INSERTION: EP1214

## 2020-02-21 LAB — BASIC METABOLIC PANEL
Anion gap: 12 (ref 5–15)
BUN: 21 mg/dL (ref 8–23)
CO2: 24 mmol/L (ref 22–32)
Calcium: 9.1 mg/dL (ref 8.9–10.3)
Chloride: 104 mmol/L (ref 98–111)
Creatinine, Ser: 1.21 mg/dL — ABNORMAL HIGH (ref 0.44–1.00)
GFR calc Af Amer: 49 mL/min — ABNORMAL LOW (ref >60)
GFR calc non Af Amer: 43 mL/min — ABNORMAL LOW (ref >60)
Glucose, Bld: 83 mg/dL (ref 70–99)
Potassium: 3.5 mmol/L (ref 3.5–5.1)
Sodium: 140 mmol/L (ref 135–145)

## 2020-02-21 LAB — CBC
HCT: 39.8 % (ref 36.0–46.0)
Hemoglobin: 12.7 g/dL (ref 12.0–15.0)
MCH: 32.1 pg (ref 26.0–34.0)
MCHC: 31.9 g/dL (ref 30.0–36.0)
MCV: 100.5 fL — ABNORMAL HIGH (ref 80.0–100.0)
Platelets: 152 10*3/uL (ref 150–400)
RBC: 3.96 MIL/uL (ref 3.87–5.11)
RDW: 12.7 % (ref 11.5–15.5)
WBC: 9.4 10*3/uL (ref 4.0–10.5)
nRBC: 0 % (ref 0.0–0.2)

## 2020-02-21 SURGERY — LOOP RECORDER INSERTION

## 2020-02-21 MED ORDER — LISINOPRIL 10 MG PO TABS: 10.0000 mg | ORAL_TABLET | Freq: Every day | ORAL | Status: DC

## 2020-02-21 MED ORDER — ENOXAPARIN SODIUM 40 MG/0.4ML ~~LOC~~ SOLN: 40.0000 mg | SUBCUTANEOUS | Status: DC

## 2020-02-21 MED ORDER — BUSPIRONE HCL 15 MG PO TABS
30.0000 mg | ORAL_TABLET | Freq: Two times a day (BID) | ORAL | Status: DC
  Administered 2020-02-21 – 2020-03-12 (×38): 30 mg via ORAL
  Filled 2020-02-21: qty 2
  Filled 2020-02-21 (×2): qty 6
  Filled 2020-02-21 (×5): qty 2
  Filled 2020-02-21: qty 6
  Filled 2020-02-21 (×4): qty 2
  Filled 2020-02-21: qty 6
  Filled 2020-02-21: qty 2
  Filled 2020-02-21 (×3): qty 6
  Filled 2020-02-21 (×2): qty 2
  Filled 2020-02-21: qty 6
  Filled 2020-02-21 (×4): qty 2
  Filled 2020-02-21 (×2): qty 6
  Filled 2020-02-21 (×5): qty 2
  Filled 2020-02-21: qty 6
  Filled 2020-02-21 (×2): qty 2
  Filled 2020-02-21: qty 6
  Filled 2020-02-21: qty 2
  Filled 2020-02-21 (×2): qty 6
  Filled 2020-02-21: qty 2
  Filled 2020-02-21 (×4): qty 6
  Filled 2020-02-21: qty 2
  Filled 2020-02-21: qty 6
  Filled 2020-02-21: qty 2
  Filled 2020-02-21 (×2): qty 6
  Filled 2020-02-21 (×3): qty 2
  Filled 2020-02-21: qty 6

## 2020-02-21 MED ORDER — ATORVASTATIN CALCIUM 10 MG PO TABS
20.0000 mg | ORAL_TABLET | Freq: Every day | ORAL | Status: DC
  Administered 2020-02-21 – 2020-03-11 (×20): 20 mg via ORAL
  Filled 2020-02-21 (×20): qty 2

## 2020-02-21 MED ORDER — ARTIFICIAL TEARS OPHTHALMIC OINT
TOPICAL_OINTMENT | Freq: Every day | OPHTHALMIC | Status: DC
  Administered 2020-02-25 – 2020-03-11 (×5): 1 via OPHTHALMIC
  Filled 2020-02-21 (×2): qty 3.5

## 2020-02-21 MED ORDER — ACETAMINOPHEN 325 MG PO TABS: 650.0000 mg | ORAL_TABLET | ORAL | Status: DC | PRN

## 2020-02-21 MED ORDER — ACETAMINOPHEN 650 MG RE SUPP: 650.0000 mg | RECTAL | Status: DC | PRN

## 2020-02-21 MED ORDER — CYCLOSPORINE 0.05 % OP EMUL
1.0000 [drp] | Freq: Every day | OPHTHALMIC | Status: DC | PRN
  Administered 2020-02-28 – 2020-03-03 (×2): 1 [drp] via OPHTHALMIC
  Filled 2020-02-21 (×3): qty 1

## 2020-02-21 MED ORDER — MIRTAZAPINE 15 MG PO TABS
7.5000 mg | ORAL_TABLET | ORAL | Status: DC | PRN
  Administered 2020-02-28: 7.5 mg via ORAL
  Filled 2020-02-21: qty 1

## 2020-02-21 MED ORDER — DONEPEZIL HCL 10 MG PO TABS
10.0000 mg | ORAL_TABLET | Freq: Every day | ORAL | Status: DC
  Administered 2020-02-21 – 2020-03-11 (×20): 10 mg via ORAL
  Filled 2020-02-21 (×20): qty 1

## 2020-02-21 MED ORDER — LISINOPRIL 5 MG PO TABS
5.0000 mg | ORAL_TABLET | Freq: Every day | ORAL | Status: DC
  Administered 2020-02-22 – 2020-02-25 (×4): 5 mg via ORAL
  Filled 2020-02-21 (×4): qty 1

## 2020-02-21 MED ORDER — OXYCODONE HCL 5 MG PO TABS
15.0000 mg | ORAL_TABLET | Freq: Four times a day (QID) | ORAL | Status: DC | PRN
  Administered 2020-02-22 – 2020-03-10 (×13): 15 mg via ORAL
  Filled 2020-02-21 (×14): qty 3

## 2020-02-21 MED ORDER — POLYVINYL ALCOHOL 1.4 % OP SOLN: 2.0000 [drp] | Freq: Four times a day (QID) | OPHTHALMIC | 0 refills | Status: DC

## 2020-02-21 MED ORDER — SENNOSIDES-DOCUSATE SODIUM 8.6-50 MG PO TABS: 1.0000 | ORAL_TABLET | Freq: Every evening | ORAL | Status: DC | PRN

## 2020-02-21 MED ORDER — PROSIGHT PO TABS
1.0000 | ORAL_TABLET | Freq: Every day | ORAL | Status: DC
  Administered 2020-02-22 – 2020-03-12 (×20): 1 via ORAL
  Filled 2020-02-21 (×20): qty 1

## 2020-02-21 MED ORDER — MEMANTINE HCL ER 28 MG PO CP24
28.0000 mg | ORAL_CAPSULE | Freq: Every day | ORAL | Status: DC
  Administered 2020-02-22 – 2020-03-12 (×20): 28 mg via ORAL
  Filled 2020-02-21 (×20): qty 1

## 2020-02-21 MED ORDER — ARTIFICIAL TEARS OPHTHALMIC OINT: TOPICAL_OINTMENT | Freq: Every day | OPHTHALMIC | Status: DC

## 2020-02-21 MED ORDER — SENNOSIDES-DOCUSATE SODIUM 8.6-50 MG PO TABS
1.0000 | ORAL_TABLET | Freq: Every evening | ORAL | Status: DC | PRN
  Administered 2020-02-22: 1 via ORAL
  Filled 2020-02-21: qty 1

## 2020-02-21 MED ORDER — MELATONIN 3 MG PO TABS
3.0000 mg | ORAL_TABLET | Freq: Every day | ORAL | Status: DC
  Administered 2020-02-21 – 2020-03-11 (×20): 3 mg via ORAL
  Filled 2020-02-21 (×20): qty 1

## 2020-02-21 MED ORDER — ACETAMINOPHEN 160 MG/5ML PO SOLN: 650.0000 mg | ORAL | Status: DC | PRN

## 2020-02-21 MED ORDER — POLYVINYL ALCOHOL 1.4 % OP SOLN
2.0000 [drp] | Freq: Four times a day (QID) | OPHTHALMIC | Status: DC
  Administered 2020-02-21 – 2020-03-12 (×74): 2 [drp] via OPHTHALMIC
  Filled 2020-02-21 (×3): qty 15

## 2020-02-21 MED ORDER — CLOPIDOGREL BISULFATE 75 MG PO TABS: 75.0000 mg | ORAL_TABLET | Freq: Every day | ORAL | Status: DC

## 2020-02-21 MED ORDER — ASPIRIN EC 81 MG PO TBEC
81.0000 mg | DELAYED_RELEASE_TABLET | Freq: Every day | ORAL | Status: DC
  Administered 2020-02-22 – 2020-03-12 (×20): 81 mg via ORAL
  Filled 2020-02-21 (×20): qty 1

## 2020-02-21 MED ORDER — LIDOCAINE-EPINEPHRINE 1 %-1:100000 IJ SOLN
INTRAMUSCULAR | Status: AC
  Filled 2020-02-21: qty 1

## 2020-02-21 MED ORDER — OXYCODONE HCL 15 MG PO TABS: 15.0000 mg | ORAL_TABLET | Freq: Four times a day (QID) | ORAL | 0 refills | Status: DC | PRN

## 2020-02-21 MED ORDER — ENOXAPARIN SODIUM 40 MG/0.4ML ~~LOC~~ SOLN
40.0000 mg | SUBCUTANEOUS | Status: DC
  Administered 2020-02-22 – 2020-03-12 (×20): 40 mg via SUBCUTANEOUS
  Filled 2020-02-21 (×21): qty 0.4

## 2020-02-21 MED ORDER — ACETAMINOPHEN 325 MG PO TABS
650.0000 mg | ORAL_TABLET | ORAL | Status: DC | PRN
  Administered 2020-02-24 – 2020-03-11 (×6): 650 mg via ORAL
  Filled 2020-02-21 (×6): qty 2

## 2020-02-21 MED ORDER — SORBITOL 70 % SOLN
30.0000 mL | Freq: Every day | Status: DC | PRN
  Administered 2020-02-23 – 2020-02-24 (×2): 30 mL via ORAL
  Filled 2020-02-21 (×3): qty 30

## 2020-02-21 MED ORDER — MELATONIN 3 MG PO TABS: 3.0000 mg | ORAL_TABLET | Freq: Every day | ORAL | 0 refills | Status: DC

## 2020-02-21 MED ORDER — RESTASIS 0.05 % OP EMUL: 1.0000 [drp] | Freq: Every day | OPHTHALMIC | Status: AC | PRN

## 2020-02-21 MED ORDER — LIDOCAINE-EPINEPHRINE 1 %-1:100000 IJ SOLN
INTRAMUSCULAR | Status: DC | PRN
  Administered 2020-02-21: 10 mL

## 2020-02-21 MED ORDER — BUSPIRONE HCL 30 MG PO TABS: 15.0000 mg | ORAL_TABLET | Freq: Two times a day (BID) | ORAL | Status: DC

## 2020-02-21 MED ORDER — VITAMIN D 25 MCG (1000 UNIT) PO TABS
1000.0000 [IU] | ORAL_TABLET | Freq: Every day | ORAL | Status: DC
  Administered 2020-02-22 – 2020-03-12 (×20): 1000 [IU] via ORAL
  Filled 2020-02-21 (×20): qty 1

## 2020-02-21 MED ORDER — MIRTAZAPINE 7.5 MG PO TABS: 7.5000 mg | ORAL_TABLET | ORAL | Status: DC | PRN

## 2020-02-21 MED ORDER — CLOPIDOGREL BISULFATE 75 MG PO TABS
75.0000 mg | ORAL_TABLET | Freq: Every day | ORAL | Status: DC
  Administered 2020-02-22 – 2020-03-12 (×20): 75 mg via ORAL
  Filled 2020-02-21 (×20): qty 1

## 2020-02-21 MED ORDER — ACETAMINOPHEN 160 MG/5ML PO SOLN: 650.0000 mg | ORAL | 0 refills | Status: DC | PRN

## 2020-02-21 SURGICAL SUPPLY — 2 items
MONITOR REVEAL LINQ II (Prosthesis & Implant Heart) ×2 IMPLANT
PACK LOOP INSERTION (CUSTOM PROCEDURE TRAY) ×3 IMPLANT

## 2020-02-21 NOTE — Progress Notes (Signed)
Standley Brooking, RN  Rehab Admission Coordinator  Physical Medicine and Rehabilitation  PMR Pre-admission      Signed  Date of Service:  02/21/2020 11:05 AM      Related encounter: ED to Hosp-Admission (Discharged) from 02/16/2020 in [place] [facility]      Signed       Show:Clear all [x] Manual[x] Template[x] Copied  Added by: [x] , RN  [] Hover for details PMR Admission Coordinator Pre-Admission Assessment   Patient: [name] is an 79 y.o., female MRN: DOB: 1941-06-16 Height:   Weight: 88.2 kg                                                                                                                                                  Insurance Information HMO:     PPO:      PCP:      IPA:      80/20:      OTHER:  PRIMARY: Medicare a and b      Policy#: [id number]      Subscriber: pt   Benefits:  Phone #: passport one online     Name: 7/12 Eff. Date: a 06/13/91 and b 10/11/94     Deduct: $1484      Out of Pocket Max: none      Life Max: none  CIR: 100%      SNF: 20 full days Outpatient: 80%     Co-Pay: 20% Home Health: 100%      Co-Pay: none DME: 80%     Co-Pay: 20% Providers: pt choice  SECONDARY: Professional [employer] the 9/12      Policy#: 13/1/92      Phone#: pt   12/11/94:       Phone#:    The "Data Collection Information Summary" for patients in Inpatient Rehabilitation Facilities with attached "Privacy Act Statement-Health Care Records" was provided and verbally reviewed with: Patient and Family   Emergency Contact Information         Contact Information     Name Relation Home Work Mobile    [place] Son [phone]        Cooper,Carolyn Granddaughter [phone]        Decker,Marilyn Other [phone]           Current Medical History  Patient Admitting Diagnosis: CVA   History of Present Illness:  79 year old right-handed female with history of hypertension, hyperlipidemia, mild  dementia maintained on Aricept as well as Namenda, chronic back pain maintained on oxycodone 15 mg every 4 hours as needed, DVT, Takotsubo cardiomyopathy, 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   Presented 02/16/2020 with left facial droop dysarthria and visual changes.  MRI of the brain showed left greater than right brainstem infarction.  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  Punctate  focus of acute ischemia in the left cerebellum.  CT angiogram of head and neck with no large vessel occlusion.  Patient did receive TPA.  Echocardiogram from 12/30/2019 showed ejection fraction 55 to 60% and no source of embolus.  Admission chemistries unremarkable.  EKG normal sinus rhythm.  Maintain on aspirin 81 mg daily and Plavix  daily for CVA prophylaxis x3 weeks then Plavix alone.  Subcutaneous Lovenox for DVT prophylaxis.  Venous Dopplers lower extremities negative.  Plan is for loop recorder placement 02/21/2020.  She is on a dysphagia #2 thin liquid diet.     Complete NIHSS TOTAL: 7 Glasgow Coma Scale Score: 15   Past Medical History      Past Medical History:  Diagnosis Date  . Arthritis    . Asthma    . Chronic back pain    . Depression    . History of pulmonary embolus (PE)    . Hx-TIA (transient ischemic attack)    . Hyperlipemia    . Hypertension    . Personal history of DVT (deep vein thrombosis)    . Scoliosis    . Sleep apnea      cpap - settingsi at 3   . 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      Family History  family history includes Other in her father.   Prior Rehab/Hospitalizations:  Has the patient had prior rehab or hospitalizations prior to admission? Yes   Has the patient had major surgery during 100 days prior to admission? Yes   Current Medications    Current Facility-Administered Medications:  .  acetaminophen (TYLENOL) tablet 650 mg, 650 mg, Oral, Q4H PRN, 650 mg at 02/19/20 1349 **OR** acetaminophen (TYLENOL) 160 MG/5ML solution  650 mg, 650 mg, Per Tube, Q4H PRN **OR** acetaminophen (TYLENOL) suppository 650 mg, 650 mg, Rectal, Q4H PRN, Layne Benton, NP .  artificial tears (LACRILUBE) ophthalmic ointment, , Left Eye, QHS, Marvel Plan, MD, Given at 02/20/20 2200 .  aspirin EC tablet 81 mg, 81 mg, Oral, Daily, Biby, Sharon L, NP, 81 mg at 02/21/20 1004 .  atorvastatin (LIPITOR) tablet 20 mg, 20 mg, Oral, QHS, Biby, Sharon L, NP, 20 mg at 02/20/20 2149 .  busPIRone (BUSPAR) tablet 30 mg, 30 mg, Oral, BID, Biby, Sharon L, NP, 30 mg at 02/21/20 0907 .  Chlorhexidine Gluconate Cloth 2 % PADS 6 each, 6 each, Topical, Daily, Biby, Sharon L, NP, 6 each at 02/21/20 1004 .  cholecalciferol (VITAMIN D3) tablet 1,000 Units, 1,000 Units, Oral, Daily, Biby, Sharon L, NP, 1,000 Units at 02/21/20 1002 .  clopidogrel (PLAVIX) tablet 75 mg, 75 mg, Oral, Daily, Biby, Sharon L, NP, 75 mg at 02/21/20 1003 .  cycloSPORINE (RESTASIS) 0.05 % ophthalmic emulsion 1 drop, 1 drop, Both Eyes, Daily PRN, Annie Main L, NP, 1 drop at 02/19/20 1231 .  donepezil (ARICEPT) tablet 10 mg, 10 mg, Oral, QHS, Biby, Sharon L, NP, 10 mg at 02/20/20 2149 .  enoxaparin (LOVENOX) injection 40 mg, 40 mg, Subcutaneous, Q24H, Biby, Sharon L, NP, 40 mg at 02/20/20 1026 .  lisinopril (ZESTRIL) tablet 10 mg, 10 mg, Oral, Daily, Marvel Plan, MD, 10 mg at 02/21/20 1002 .  melatonin tablet 3 mg, 3 mg, Oral, QHS, Aroor, Dara Lords, MD, 3 mg at 02/20/20 2150 .  memantine (NAMENDA XR) 24 hr capsule 28 mg, 28 mg, Oral, Daily, Biby, Sharon L, NP, 28 mg at 02/21/20 1003 .  mirtazapine (REMERON) tablet 7.5 mg, 7.5 mg, Oral, PRN, Arline Asp, NP,  7.5 mg at 02/19/20 2007 .  oxyCODONE (Oxy IR/ROXICODONE) immediate release tablet 15 mg, 15 mg, Oral, QID PRN, Layne Benton, NP, 15 mg at 02/21/20 0908 .  polyvinyl alcohol (LIQUIFILM TEARS) 1.4 % ophthalmic solution 2 drop, 2 drop, Left Eye, QID, Marvel Plan, MD, 2 drop at 02/21/20 1004 .  senna-docusate (Senokot-S) tablet 1  tablet, 1 tablet, Oral, QHS PRN, Layne Benton, NP   Patients Current Diet:     Diet Order                 DIET DYS 2 Room service appropriate? Yes with Assist; Fluid consistency: Thin  Diet effective now                      Precautions / Restrictions Precautions Precautions: Fall Restrictions Weight Bearing Restrictions: No    Has the patient had 2 or more falls or a fall with injury in the past year?No   Prior Activity Level Limited Community (1-2x/wk): Mod I with RW; suprivsion for meds; Carloyn asissted with dressing   Prior Functional Level Prior Function Level of Independence: Needs assistance Gait / Transfers Assistance Needed: pt reports ambulating with a RW for household distances ADL's / Homemaking Assistance Needed: Pt reports grand daughter assists her with bathing, and all IADLs    Self Care: Did the patient need help bathing, dressing, using the toilet or eating?  Needed some help   Indoor Mobility: Did the patient need assistance with walking from room to room (with or without device)? Independent   Stairs: Did the patient need assistance with internal or external stairs (with or without device)? Independent   Functional Cognition: Did the patient need help planning regular tasks such as shopping or remembering to take medications? Needed some help   Home Assistive Devices / Equipment Home Equipment: Walker - 2 wheels, Wheelchair - manual   Prior Device Use: Indicate devices/aids used by the patient prior to current illness, exacerbation or injury? Walker   Current Functional Level Cognition   Overall Cognitive Status: Impaired/Different from baseline Current Attention Level: Sustained Orientation Level: Oriented X4 Following Commands: Follows one step commands consistently Safety/Judgement: Decreased awareness of deficits Attention: Focused, Selective, Sustained Focused Attention: Appears intact Sustained Attention: Appears intact Selective  Attention: Impaired Selective Attention Impairment: Verbal basic, Functional basic Memory: Impaired Memory Impairment: Retrieval deficit, Decreased short term memory Decreased Short Term Memory: Verbal basic Awareness: Appears intact Problem Solving: Impaired Problem Solving Impairment: Verbal basic, Functional basic    Extremity Assessment (includes Sensation/Coordination)   Upper Extremity Assessment: Generalized weakness  Lower Extremity Assessment: Defer to PT evaluation     ADLs   Overall ADL's : Needs assistance/impaired Eating/Feeding: Minimal assistance, Sitting Grooming: Wash/dry hands, Wash/dry face, Oral care, Brushing hair, Moderate assistance, Sitting Upper Body Bathing: Moderate assistance, Sitting Lower Body Bathing: Maximal assistance, Sit to/from stand Upper Body Dressing : Maximal assistance, Sitting Lower Body Dressing: Total assistance, Sit to/from stand Toilet Transfer: Total assistance Toilet Transfer Details (indicate cue type and reason): unable to safely attempt with +1 assist  Toileting- Clothing Manipulation and Hygiene: Total assistance, Sit to/from stand Functional mobility during ADLs: Total assistance General ADL Comments: Pt fatigued.  Heavy rt lateral lean      Mobility   Overal bed mobility: Needs Assistance Bed Mobility: Supine to Sit Supine to sit: Min guard, HOB elevated     Transfers   Overall transfer level: Needs assistance Equipment used: Rolling walker (2 wheeled), 1 person  hand held assist Transfers: Sit to/from Stand, Stand Pivot Transfers Sit to Stand: Mod assist Stand pivot transfers: Mod assist General transfer comment: pt performs 2 stand pivot transfers with PT assist, 3 sit to stands with use of RW     Ambulation / Gait / Stairs / Wheelchair Mobility   Ambulation/Gait Ambulation/Gait assistance: Mod assist Gait Distance (Feet): 1 Feet Assistive device: Rolling walker (2 wheeled) Gait Pattern/deviations: Step-to  pattern General Gait Details: pt with short steps, taking one step forward with each foot, otherwise working on pre-gait marching and weight shifting Gait velocity: reduced Gait velocity interpretation: <1.31 ft/sec, indicative of household ambulator     Posture / Balance Dynamic Sitting Balance Sitting balance - Comments: relaint on UE support Balance Overall balance assessment: Needs assistance Sitting-balance support: Single extremity supported, Feet supported Sitting balance-Leahy Scale: Poor Sitting balance - Comments: relaint on UE support Postural control: Posterior lean Standing balance support: Bilateral upper extremity supported Standing balance-Leahy Scale: Poor Standing balance comment: modA to maintain static standing due to R lateral lean     Special needs/care consideration   Designated visitors are son, Harvie Heck and granddaughter, Eber Jones LOOP to be placed 7/12 CPAP pta    Previous Home Environment  Living Arrangements:  (granddaughter, Eber Jones , lives with patient)  Lives With: Other (Comment) Available Help at Discharge: Family, Available 24 hours/day Eber Jones and son, Harvie Heck will arrange 24/7 asisst after d/c ho) Type of Home: House Home Layout: One level Home Access: Stairs to enter Entrance Stairs-Rails: Right, Left Entrance Stairs-Number of Steps:  (6 up front but level entry at other entry) Bathroom Shower/Tub: Tub/shower unit, Door [employer]: Standard Bathroom Accessibility: Yes How Accessible: Accessible via walker Home Care Services: No Additional Comments: granddaughter lives with patient and works night shift   Discharge Living Setting Plans for Discharge Living Setting: Patient's home (granddaughter lives with patient) Type of Home at Discharge: House Discharge Home Layout: One level Discharge Home Access: Stairs to enter Entrance Stairs-Rails: Right, Left, Can reach both Entrance Stairs-Number of Steps: 6 steps up front, but level entry to  the side Discharge Bathroom Shower/Tub: Tub/shower unit, Door Discharge Bathroom Toilet: Standard Discharge Bathroom Accessibility: Yes How Accessible: Accessible via walker Does the patient have any problems obtaining your medications?: No   Social/Family/Support Systems Patient Roles: Parent Contact Information: Harvie Heck, son , is main contact Anticipated Caregiver: son and grand daughter Anticipated Caregiver's Contact Information: see above Ability/Limitations of Caregiver: son retired; granddaughter works nights Caregiver Availability: 24/7 Discharge Plan Discussed with Primary Caregiver: Yes Is Caregiver In Agreement with Plan?: Yes Does Caregiver/Family have Issues with Lodging/Transportation while Pt is in Rehab?: No   Goals Patient/Family Goal for Rehab: Min PT and OT, supervision SLP Expected length of stay: elos 22 TO 26 DAYS Pt/Family Agrees to Admission and willing to participate: Yes Program Orientation Provided & Reviewed with Pt/Caregiver Including Roles  & Responsibilities: Yes   Decrease burden of Care through IP rehab admission: n/a   Possible need for SNF placement upon discharge:not anticipated   Patient Condition: This patient's condition remains as documented in the consult dated 7/9/20212, in which the Rehabilitation Physician determined and documented that the patient's condition is appropriate for intensive rehabilitative care in an inpatient rehabilitation facility. Will admit to inpatient rehab today.   Preadmission Screen Completed By:  Clois Dupes, RN, 02/21/2020 11:15 AM ______________________________________________________________________   Discussed status with Dr. Carlis Abbott on 02/21/2020 at 1115 and received approval for admission today.   Admission Coordinator:  Beckie Salts,  Foye Spurling, time 0034 Date 02/21/2020             Cosigned by: Horton Chin, MD at 02/21/2020 11:26 AM  Revision History                Note  Details  Author Standley Brooking, RN File Time 02/21/2020 11:15 AM  Author Type Rehab Admission Coordinator Status Signed  Last Editor Standley Brooking, RN Service Physical Medicine and [facility] Acct # [id number] Admit Date 02/21/2020

## 2020-02-21 NOTE — Progress Notes (Signed)
OT Cancellation Note  Patient Details Name: [name] MRN: [id number] DOB: 04/13/1941   Cancelled Treatment:    Reason Eval/Treat Not Completed: Patient at procedure or test/ unavailable. Pt off unit - will follow when able.   Damonique Brunelle/OTS Helio Lack 02/21/2020, 3:00 PM

## 2020-02-21 NOTE — Consult Note (Addendum)
ELECTROPHYSIOLOGY CONSULT NOTE  Patient ID: [name] MRN: [id number], DOB/AGE: March 27, 1941   Admit date: 02/16/2020 Date of Consult: 02/21/2020  Primary Physician: [name], MD Primary Cardiologist: Dr. Cristal Deer Reason for Consultation: Cryptogenic stroke ; recommendations regarding Implantable Loop Recorder requested by Dr. Roda Shutters  History of Present Illness [name] was admitted on 02/16/2020 with L facial droop, visual changes, slurred speech, R sided weakness.    PMHx includes: HTN, HLD, asthma, DVT/PE ("about a yoear or so ago per her son), TIA, OSA w/CPAP 09/17/19 after admission for chest pain, elevated troponins consistent with NSTEMI. Cath showed nonobstructive CAD. Echo showed cardiomyopathy with EF 30-35%. 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  she has undergone workup for stroke including echocardiogram (in May 2021) and carotid angio.  The patient has been monitored on telemetry which has demonstrated sinus rhythm with no arrhythmias.  Neurology has deferred TEE   Echocardiogram 12/13/2019 (discussed with neurology, not felt needed to repeat this admission) IMPRESSIONS  1. Left ventricular ejection fraction, by estimation, is 55 to 60%. The  left ventricle has normal function. The left ventricle has no regional  wall motion abnormalities. There is mild left ventricular hypertrophy.  Left ventricular diastolic parameters  are consistent with Grade I diastolic dysfunction (impaired relaxation).  GLS -21.1%.  2. Right ventricular systolic function is normal. The right ventricular  size is normal. There is mildly elevated pulmonary artery systolic  pressure. The estimated right ventricular systolic pressure is 39.0 mmHg.  3. Left atrial size was mildly dilated.  4. The mitral valve  is normal in structure. Trivial mitral valve  regurgitation. No evidence of mitral stenosis.  5. The aortic valve is tricuspid. Aortic valve regurgitation is mild.  Mild to moderate aortic valve sclerosis/calcification is present, without  any evidence of aortic stenosis.  6. The inferior vena cava is dilated in size with <50% respiratory  variability, suggesting right atrial pressure of 15 mmHg.     Lab work is reviewed.  Prior to admission, the patient denies chest pain, shortness of breath, dizziness, palpitations, or syncope.  They are recovering from their stroke with plans to CIR at discharge.       Past Medical History:  Diagnosis Date  . Arthritis   . Asthma   . Chronic back pain   . Depression   . History of pulmonary embolus (PE)   . Hx-TIA (transient ischemic attack)   . Hyperlipemia   . Hypertension   . Personal history of DVT (deep vein thrombosis)   . Scoliosis   . Sleep apnea    cpap - settingsi at 2   . 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 ARTHROSCOPIC REPAIR ACL  02/03/2004  . LEFT HEART CATH AND CORONARY ANGIOGRAPHY N/A 09/16/2019   Procedure: LEFT HEART CATH AND CORONARY ANGIOGRAPHY;  Surgeon: Yvonne Kendall, MD;  Location: [facility];  Service: Cardiovascular;  Laterality: N/A;  . OOPHORECTOMY  1970  . SPINE SURGERY  03/31/2006  . TOTAL KNEE ARTHROPLASTY Left 05/17/2016   Procedure: LEFT TOTAL KNEE ARTHROPLASTY;  Surgeon: Kathryne Hitch, MD;  Location: [facility];  Service: Orthopedics;  Laterality: Left;  Adductor Block; Spinal to General  . TUBAL LIGATION  1983     Medications  Prior to Admission  Medication Sig Dispense Refill Last Dose  . amLODipine (NORVASC) 10 MG tablet Take 10 mg by mouth daily.   Past Week at Unknown time  . aspirin EC 81 MG EC tablet Take 1 tablet (81 mg total) by mouth daily. 90 tablet 3 Past Week at Unknown time  . atorvastatin  (LIPITOR) 20 MG tablet Take 20 mg by mouth at bedtime.    Past Week at Unknown time  . busPIRone (BUSPAR) 30 MG tablet Take 1 tablet (30 mg total) by mouth in the morning and at bedtime. (Patient taking differently: Take 15 mg by mouth in the morning and at bedtime. ) 180 tablet 0 Past Week at Unknown time  . cholecalciferol (VITAMIN D3) 25 MCG (1000 UT) tablet Take 1,000 Units by mouth daily.   Past Week at Unknown time  . cycloSPORINE (RESTASIS) 0.05 % ophthalmic emulsion Place 1 drop into both eyes daily as needed (dry eyes).   Past Month at Unknown time  . donepezil (ARICEPT) 10 MG tablet Take 10 mg by mouth at bedtime.    Past Week at Unknown time  . fexofenadine (ALLEGRA) 180 MG tablet Take 180 mg by mouth daily.   Past Week at Unknown time  . furosemide (LASIX) 40 MG tablet Take 40 mg by mouth as needed for edema. PRN   Past Month at Unknown time  . linaclotide (LINZESS) 145 MCG CAPS capsule Take 145 mcg by mouth as needed (constipation). Every other day   Past Week at Unknown time  . lisinopril (PRINIVIL,ZESTRIL) 40 MG tablet Take 40 mg by mouth daily.    Past Week at Unknown time  . memantine (NAMENDA XR) 28 MG CP24 24 hr capsule Take 28 mg by mouth daily.   0 Past Week at Unknown time  . Multiple Vitamins-Minerals (MULTI-VITAMIN GUMMIES PO) Take 1 each by mouth daily.    Past Week at Unknown time  . nitroGLYCERIN (NITROSTAT) 0.4 MG SL tablet Place 1 tablet (0.4 mg total) under the tongue every 5 (five) minutes as needed for chest pain. 25 tablet 2 unk  . oxyCODONE (ROXICODONE) 15 MG immediate release tablet Take 7.5 mg by mouth 4 (four) times daily as needed for pain.    Past Week at Unknown time  . potassium chloride (KLOR-CON) 10 MEQ tablet Take 10 mEq by mouth daily as needed (potassium).    Past Month at Unknown time  . zinc gluconate 50 MG tablet Take 25 mg by mouth daily.   Past Week at Unknown time    Inpatient Medications:  . artificial tears   Left Eye QHS  . aspirin EC  81 mg  Oral Daily  . atorvastatin  20 mg Oral QHS  . busPIRone  30 mg Oral BID  . Chlorhexidine Gluconate Cloth  6 each Topical Daily  . cholecalciferol  1,000 Units Oral Daily  . clopidogrel  75 mg Oral Daily  . donepezil  10 mg Oral QHS  . enoxaparin (LOVENOX) injection  40 mg Subcutaneous Q24H  . lisinopril  10 mg Oral Daily  . melatonin  3 mg Oral QHS  . memantine  28 mg Oral Daily  . polyvinyl alcohol  2 drop Left Eye QID    Allergies:  Allergies  Allergen Reactions  . T-Pa [Alteplase] Swelling    Lip swelling immediately post tPA  . Codeine Itching    Social History   Socioeconomic History  . Marital status: Widowed    Spouse name: Chrissie Noa  .  Number of children: 2  . Years of education: 10  . Highest education level: Not on file  Occupational History    Employer: RETIRED  Tobacco Use  . Smoking status: Former Smoker    Packs/day: 1.00    Years: 10.00    Pack years: 10.00    Types: Cigarettes    Quit date: 08/12/1973    Years since quitting: 46.5  . Smokeless tobacco: Never Used  Substance and Sexual Activity  . Alcohol use: No  . Drug use: No  . Sexual activity: Not on file  Other Topics Concern  . Not on file  Social History Narrative   Patient lives at home with spouse.   Caffeine Use: occasionally   Social Determinants of Health   Financial Resource Strain:   . Difficulty of Paying Living Expenses:   Food Insecurity:   . Worried About Programme researcher, broadcasting/film/video in the Last Year:   . Barista in the Last Year:   Transportation Needs:   . Freight forwarder (Medical):   Marland Kitchen Lack of Transportation (Non-Medical):   Physical Activity:   . Days of Exercise per Week:   . Minutes of Exercise per Session:   Stress:   . Feeling of Stress :   Social Connections:   . Frequency of Communication with Friends and Family:   . Frequency of Social Gatherings with Friends and Family:   . Attends Religious Services:   . Active Member of Clubs or Organizations:   .  Attends Banker Meetings:   Marland Kitchen Marital Status:   Intimate Partner Violence:   . Fear of Current or Ex-Partner:   . Emotionally Abused:   Marland Kitchen Physically Abused:   . Sexually Abused:      Family History  Problem Relation Age of Onset  . Other Father        kidney problems      Review of Systems: All other systems reviewed and are otherwise negative except as noted above.  Physical Exam: Vitals:   02/20/20 2356 02/21/20 0000 02/21/20 0422 02/21/20 0857  BP: (!) 98/50 (!) 101/54 (!) 122/55 130/71  Pulse: (!) 46 (!) 46 (!) 43 (!) 58  Resp: 14 16 18 16   Temp: 98.5 F (36.9 C)  98.6 F (37 C) 98.1 F (36.7 C)  TempSrc: Oral  Oral Oral  SpO2: 93%  100% 96%  Weight:        GEN- The patient is well appearing, alert and oriented x 3 today.   Head- normocephalic, atraumatic Eyes-  Sclera clear, conjunctiva pink Ears- hearing intact Oropharynx- clear Neck- supple Lungs- CTA b/l, normal work of breathing Heart- RRR, no murmurs, rubs or gallops  GI- soft, NT, ND Extremities- no clubbing, cyanosis, or edema MS- no significant deformity or atrophy Skin- no rash or lesion Psych- euthymic mood, full affect   Labs:   Lab Results  Component Value Date   WBC 9.4 02/21/2020   HGB 12.7 02/21/2020   HCT 39.8 02/21/2020   MCV 100.5 (H) 02/21/2020   PLT 152 02/21/2020    Recent Labs  Lab 02/16/20 2142 02/16/20 2143 02/21/20 0442  NA 141   < > 140  K 3.5   < > 3.5  CL 104   < > 104  CO2 26   < > 24  BUN 8   < > 21  CREATININE 0.88   < > 1.21*  CALCIUM 9.6   < > 9.1  PROT  7.3  --   --   BILITOT 1.0  --   --   ALKPHOS 58  --   --   ALT 14  --   --   AST 21  --   --   GLUCOSE 133*   < > 83   < > = values in this interval not displayed.   Lab Results  Component Value Date   TROPONINI <0.03 09/29/2016   Lab Results  Component Value Date   CHOL 130 02/17/2020   CHOL 153 09/17/2019   Lab Results  Component Value Date   HDL 50 02/17/2020   HDL 45  09/17/2019   Lab Results  Component Value Date   LDLCALC 70 02/17/2020   LDLCALC 88 09/17/2019   Lab Results  Component Value Date   TRIG 51 02/17/2020   TRIG 99 09/17/2019   Lab Results  Component Value Date   CHOLHDL 2.6 02/17/2020   CHOLHDL 3.4 09/17/2019   No results found for: LDLDIRECT  No results found for: DDIMER   Radiology/Studies:   CT Code Stroke CTA Head W/WO contrast Result Date: 02/16/2020 CLINICAL DATA:  Right facial droop EXAM: CT ANGIOGRAPHY HEAD AND NECK TECHNIQUE: Multidetector CT imaging of the head and neck was performed using the standard protocol during bolus administration of intravenous contrast. Multiplanar CT image reconstructions and MIPs were obtained to evaluate the vascular anatomy. Carotid stenosis measurements (when applicable) are obtained utilizing NASCET criteria, using the distal internal carotid diameter as the denominator. CONTRAST:  75 mL Omnipaque 350 COMPARISON:  None. FINDINGS: CTA NECK Aortic arch: Great vessel origins are patent. Right carotid system: Patent. Minimal calcified plaque at the ICA origin without measurable stenosis. Mild tortuosity of the distal cervical ICA. Left carotid system: Patent. Trace calcified plaque at the ICA origin without measurable stenosis. Mild tortuosity of the distal cervical ICA. Vertebral arteries: Patent.  No measurable stenosis. Skeleton: Cervical spine degenerative changes. Other neck: No mass or adenopathy. Upper chest: No apical lung mass. Dilatation of the main pulmonary artery reflecting pulmonary arterial hypertension. Review of the MIP images confirms the above findings CTA HEAD Anterior circulation: Intracranial internal carotid arteries are patent with mild atherosclerotic irregularity. Anterior and middle cerebral arteries are patent. Posterior circulation: Intracranial vertebral arteries, basilar artery, and posterior cerebral arteries are patent. PICA, AICA, and superior cerebellar artery origins are  patent. There are patent bilateral posterior communicating arteries. Venous sinuses: Patent as allowed by contrast bolus timing. Review of the MIP images confirms the above findings IMPRESSION: No large vessel occlusion or hemodynamically significant stenosis. Electronically Signed   By: Guadlupe Spanish M.D.   On: 02/16/2020 22:02     CT HEAD WO CONTRAST Result Date: 02/17/2020 CLINICAL DATA:  79 year old female code stroke presentation yesterday, right facial droop. Small age indeterminate left cerebellar SCA infarct on plain head CT. CTA negative for large vessel occlusion or stenosis. EXAM: CT HEAD WITHOUT CONTRAST TECHNIQUE: Contiguous axial images were obtained from the base of the skull through the vertex without intravenous contrast. COMPARISON:  CT head, CTA head and neck yesterday, and earlier. 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 No other intracranial mass effect. No midline shift. No ventriculomegaly. Stable gray-white matter differentiation throughout the brain. Small left SCA territory infarct appears unchanged. Largely normal for age gray-white differentiation elsewhere. No cortically based acute infarct identified. No acute intracranial hemorrhage identified. Vascular: Mild Calcified atherosclerosis at the skull base. Skull: No acute osseous abnormality identified. Hyperostosis  of the calvarium. Sinuses/Orbits: Visualized paranasal sinuses and mastoids are stable and well pneumatized. Other: No acute orbit or scalp soft tissue findings. IMPRESSION: 1. Stable since yesterday. No cortically based infarct or acute intracranial hemorrhage identified. 2. Small left SCA territory infarct, new since 2018 but favor chronic. Electronically Signed   By: [name] M.D.   On: 02/17/2020 12:45    MR BRAIN WO CONTRAST Result Date: 02/17/2020 CLINICAL DATA:  Sudden onset of slurred speech and double vision EXAM: MRI HEAD WITHOUT CONTRAST TECHNIQUE: Multiplanar,  multiecho pulse sequences of the brain and surrounding structures were obtained without intravenous contrast. COMPARISON:  02/02/2014 brain MRI FINDINGS: Brain: There is an acute infarct of the pons, left-greater-than-right. There is also punctate focus of acute ischemia in the left cerebellum. No acute ischemia of the midbrain or above. There is multifocal periventricular white matter hyperintensity, most often a result of chronic microvascular ischemia. Normal CSF volume. Small amount of hemosiderin at the inferior pons. Foci of chronic microhemorrhage in the left parietal and occipital lobes. Vascular: Normal flow voids. Skull and upper cervical spine: Normal marrow signal. Sinuses/Orbits: Negative. Other: None. IMPRESSION: 1. Acute bilateral pontine infarcts, left-greater-than-right. 2. Punctate focus of acute ischemia in the left cerebellum. 3. Findings of chronic microvascular ischemia. Electronically Signed   By: Deatra Robinson M.D.   On: 02/17/2020 22:45     CT HEAD CODE STROKE WO CONTRAST Result Date: 02/16/2020 CLINICAL DATA:  Code stroke.  Right facial droop EXAM: CT HEAD WITHOUT CONTRAST TECHNIQUE: Contiguous axial images were obtained from the base of the skull through the vertex without intravenous contrast. COMPARISON:  2018 FINDINGS: Brain: There is no acute intracranial hemorrhage, mass effect, or edema. No new loss of gray-white differentiation. There is an age-indeterminate small infarct of the superior left cerebellum. Ventricles and sulci are normal in size and configuration. Patchy hypoattenuation in the supratentorial white matter is nonspecific but may reflect mild chronic microvascular ischemic changes. There is no extra-axial fluid collection Vascular: There is no hyperdense vessel. Mild intracranial atherosclerotic calcification at the skull base. Skull: Unremarkable. Sinuses/Orbits: No acute abnormality. Other: Mastoid air cells are clear. ASPECTS ([place] Stroke Program Early CT  Score) - Ganglionic level infarction (caudate, lentiform nuclei, internal capsule, insula, M1-M3 cortex): 7 - Supraganglionic infarction (M4-M6 cortex): 3 Total score (0-10 with 10 being normal): 10 IMPRESSION: There is no acute intracranial hemorrhage or evidence of definite acute infarction. ASPECT score is 10. New age-indeterminate small infarct of the superior left cerebellum. These results were communicated to Dr. Laurence [name] at 9:50 pmon 7/7/2021by text page via the [facility] messaging system. Electronically Signed   By: Guadlupe Spanish M.D.   On: 02/16/2020 21:57     VAS [place] LOWER EXTREMITY VENOUS (DVT) Result Date: 02/21/2020  Lower Venous DVT Study Indications: Stroke.  Limitations: Postioning secondary to stroke. Comparison Study: Prior study from 09/30/16 is available for comparison Performing Technologist: Sherren Kerns RVS  Examination Guidelines: A complete evaluation includes B-mode imaging, spectral Doppler, color Doppler, and power Doppler as needed of all accessible portions of each vessel. Bilateral testing is considered an integral part of a complete examination. Limited examinations for reoccurring indications may be performed as noted. The reflux portion of the exam is performed with the patient in reverse Trendelenburg.   Summary: RIGHT: - Findings appear essentially unchanged compared to previous examination. - There is no evidence of deep vein thrombosis in the lower extremity.  LEFT: - Findings appear essentially unchanged compared to previous examination. - There  is no evidence of deep vein thrombosis in the lower extremity. However, portions of this examination were limited- see technologist comments above.  *See table(s) above for measurements and observations. Electronically signed by Coral Else MD on 02/21/2020 at 6:32:48 AM.    Final     12-lead ECG  All prior EKG's in EPIC reviewed with no documented atrial fibrillation  Telemetry SB/SR  Assessment and Plan:  1. Cryptogenic  stroke The patient presents with cryptogenic stroke.   I spoke at length with the patient about monitoring for afib with either a 30 day event monitor or an implantable loop recorder.  Risks, benefits, and alteratives to implantable loop recorder were discussed with the patient today.   At this time, the patient is very clear in their decision to proceed with implantable loop recorder.   I also spoke to [name]) via telephone, he reports that the neuro team had discussed the loop with him, we discussed the rational and monitoring, he had no follow up questions and remained agreeable to have him Mom to proceed with loop implant. The patient carries some degree of dementia diagnosis, she is AAO x3 today, her son mentions that she has periods of forgetfulness but all in all does well.  Wound care was reviewed with the patient (keep incision clean and dry for 3 days).  Wound check will be scheduled for the patient  Please call with questions.   Sheilah Pigeon, PA-C 02/21/2020 Cryptogenic Stroke   DVT  HTN  Stress Cardiomyopathy-resolved  Strokes with residual deficits  For rehab  Discussed the role of Loop for the detection of atrial fibrillation and the issues related to anticoagulation and antiplatelet therapies  Agrees to proceed with loop recorder

## 2020-02-21 NOTE — TOC Transition Note (Signed)
Transition of Care [facility]) - CM/SW Discharge Note   Patient Details  Name: [name] MRN: [id number] Date of Birth: 1941/06/24  [facility] CM/SW Contact:  Kermit Balo, RN Phone Number: 02/21/2020, 11:21 AM   Clinical Narrative:    Pt is discharging to CIR today. CM signing off.   Final next level of care: IP Rehab Facility Barriers to Discharge: No Barriers Identified   Patient Goals and CMS Choice        Discharge Placement                       Discharge Plan and Services                                     Social Determinants of Health (SDOH) Interventions     Readmission Risk Interventions No flowsheet data found.

## 2020-02-21 NOTE — Progress Notes (Signed)
Sarah Fennel, MD  Physician  Physical Medicine and Rehabilitation  Consult Note      Signed  Date of Service:  02/18/2020  4:58 AM      Related encounter: ED to Hosp-Admission (Discharged) from 02/16/2020 in [facility]      Signed      Expand All Collapse All  Show:Clear all Manual[x] Template[] Copied  Added by: [name], Sarah Rossetti, PA-C[x] Sarah Fennel, MD  Hover for details          Physical Medicine and Rehabilitation Consult Reason for Consult: Slurred speech with left facial droop and right side weakness. Referring Physician: Dr. Pearlean Brownie   HPI: [name] is a 79 y.o. 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  History taken from patient and chart review due to some cognitive delay. Patient lives with granddaughter.  1 level home 6 steps to entry.  Her granddaughter works at night, however patient states that she has a son in the area as well as a daughter-in-law that worked at different times.  Patient ambulates with a rolling walker for household distances PTA.  Granddaughter assist her with some bathing and ADLs.  She presented on 02/16/2020 with left facial droop, dysarthria and visual changes.  MRI brain personally reviewed, showing size left  > right brainstem infarct.  Per report CT/MRI showed acute bilateral pontine infarcts, left > right. Punctate focus of acute ischemia in the left cerebellum.  CT angiogram of head and neck with no large vessel occlusion.  Patient  receive TPA.  Echocardiogram from 12/2019 showing EF of 55-60%.  Echocardiogram unable to be completed, plan to follow up. Admission chemistries unremarkable.  ECG reviewed, normal sinus rhythm.  Currently maintained on aspirin 325 mg daily for CVA prophylaxis.  Hospital course complicated by post stroke dysphagia, started on dysphagia #2 thin liquid diet.  Therapy evaluation completed with  recommendations of physical medicine rehab consult.    Review of Systems  Constitutional: Positive for malaise/fatigue. Negative for chills and fever.  HENT: Negative for hearing loss.   Eyes: Positive for blurred vision. Negative for double vision.  Respiratory: Negative for cough and shortness of breath.   Cardiovascular: Positive for leg swelling. Negative for chest pain and palpitations.  Gastrointestinal: Positive for constipation. Negative for heartburn, nausea and vomiting.  Genitourinary: Negative for dysuria, flank pain and hematuria.  Musculoskeletal: Positive for myalgias.  Skin: Negative for rash.  Neurological: Positive for speech change, focal weakness and weakness.  Psychiatric/Behavioral: Positive for depression. The patient has insomnia.   All other systems reviewed and are negative.       Past Medical History:  Diagnosis Date  . Arthritis    . Asthma    . Chronic back pain    . Depression    . History of pulmonary embolus (PE)    . Hx-TIA (transient ischemic attack)    . Hyperlipemia    . Hypertension    . Personal history of DVT (deep vein thrombosis)    . Scoliosis    . Sleep apnea      cpap - settingsi at 3   . 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         Past Surgical History:  Procedure Laterality Date  . ARTHROSCOPIC REPAIR ACL   02/03/2004  . LEFT HEART CATH AND CORONARY ANGIOGRAPHY N/A 09/16/2019    Procedure: LEFT HEART CATH AND CORONARY ANGIOGRAPHY;  Surgeon: Yvonne Kendall, MD;  Location: [facility];  Service: Cardiovascular;  Laterality: N/A;  . OOPHORECTOMY   1970  . SPINE SURGERY   03/31/2006  . TOTAL KNEE ARTHROPLASTY Left 05/17/2016    Procedure: LEFT TOTAL KNEE ARTHROPLASTY;  Surgeon: Kathryne Hitch, MD;  Location: [facility];  Service: Orthopedics;  Laterality: Left;  Adductor Block; Spinal to General  . TUBAL LIGATION   1983         Family History  Problem Relation Age of Onset  . Other Father          kidney problems      Social History:  reports that she quit smoking about 46 years ago. Her smoking use included cigarettes. She has a 10.00 pack-year smoking history. She has never used smokeless tobacco. She reports that she does not drink alcohol and does not use drugs. Allergies:       Allergies  Allergen Reactions  . T-Pa [Alteplase] Swelling      Lip swelling immediately post tPA  . Codeine Itching          Medications Prior to Admission  Medication Sig Dispense Refill  . amLODipine (NORVASC) 10 MG tablet Take 10 mg by mouth daily.      Marland Kitchen aspirin EC 81 MG EC tablet Take 1 tablet (81 mg total) by mouth daily. 90 tablet 3  . atorvastatin (LIPITOR) 20 MG tablet Take 20 mg by mouth at bedtime.       . busPIRone (BUSPAR) 30 MG tablet Take 1 tablet (30 mg total) by mouth in the morning and at bedtime. 180 tablet 0  . cholecalciferol (VITAMIN D3) 25 MCG (1000 UT) tablet Take 1,000 Units by mouth daily.      Marland Kitchen donepezil (ARICEPT) 10 MG tablet Take 10 mg by mouth at bedtime.       . furosemide (LASIX) 40 MG tablet Take 40 mg by mouth as needed for edema. PRN      . hydrochlorothiazide (HYDRODIURIL) 25 MG tablet Take 25 mg by mouth daily.      Marland Kitchen linaclotide (LINZESS) 145 MCG CAPS capsule Take 145 mcg by mouth as needed. constipation      . lisinopril (PRINIVIL,ZESTRIL) 40 MG tablet Take 40 mg by mouth daily.       . memantine (NAMENDA XR) 28 MG CP24 24 hr capsule Take 28 mg by mouth daily.    0  . mirtazapine (REMERON) 30 MG tablet Take 15 mg by mouth at bedtime. 1/4-1/2 prn sleep      . Multiple Vitamins-Minerals (MULTI-VITAMIN GUMMIES PO) Take by mouth.      . nitroGLYCERIN (NITROSTAT) 0.4 MG SL tablet Place 1 tablet (0.4 mg total) under the tongue every 5 (five) minutes as needed for chest pain. 25 tablet 2  . omeprazole (PRILOSEC) 20 MG capsule Take 20 mg by mouth daily.      Marland Kitchen oxyCODONE (ROXICODONE) 15 MG immediate release tablet Take 7.5 mg by mouth 4 (four) times daily as needed for pain.       .  potassium chloride (KLOR-CON) 10 MEQ tablet Take 10 mEq by mouth daily.      . RESTASIS 0.05 % ophthalmic emulsion Place 1 drop into both eyes daily as needed (dry eyes).       . zinc gluconate 50 MG tablet Take 25 mg by mouth daily.          Home: Home Living Family/patient expects to be discharged to:: Private residence Living Arrangements: Other relatives (gdtr) Available Help at Discharge: Family,  Available PRN/intermittently (gdtr works nights per pt) Type of Home: House Home Access: Stairs to enter Secretary/administrator of Steps: 6 Entrance Stairs-Rails: Right, Left Home Layout: One level Bathroom Shower/Tub: Engineer, manufacturing systems: Standard Home Equipment: Environmental consultant - 2 wheels, Wheelchair - manual Additional Comments: Pt reports she lives with her grand daughter who works at night   Functional History: Prior Function Level of Independence: Needs Technical sales engineer / Transfers Assistance Needed: pt reports ambulating with a RW for household distances ADL's / Engineer, water Assistance Needed: Pt reports grand daughter assists her with bathing, and all IADLs  Functional Status:  Mobility: Bed Mobility Overal bed mobility: Needs Assistance Bed Mobility: Supine to Sit Supine to sit: Min guard, HOB elevated Transfers Overall transfer level: Needs assistance Equipment used: 1 person hand held assist Transfers: Sit to/from Stand, Water engineer Transfers Sit to Stand: Max assist Stand pivot transfers: Max assist General transfer comment: pt unable to safely obtain standing position with PT assistance and use of RW, PT provides R knee block and BUE support to assist pt into standing, pt with increased trunk flexion when stepping and turning to recliner   ADL: ADL Overall ADL's : Needs assistance/impaired Eating/Feeding: Minimal assistance, Sitting Grooming: Wash/dry hands, Wash/dry face, Oral care, Brushing hair, Moderate assistance, Sitting Upper Body Bathing: Moderate  assistance, Sitting Lower Body Bathing: Maximal assistance, Sit to/from stand Upper Body Dressing : Maximal assistance, Sitting Lower Body Dressing: Total assistance, Sit to/from stand Toilet Transfer: Total assistance Toilet Transfer Details (indicate cue type and reason): unable to safely attempt with +1 assist  Toileting- Clothing Manipulation and Hygiene: Total assistance, Sit to/from stand Functional mobility during ADLs: Total assistance General ADL Comments: Pt fatigued.  Heavy rt lateral lean    Cognition: Cognition Overall Cognitive Status: Impaired/Different from baseline Orientation Level: Oriented X4 Attention: Focused, Selective, Sustained Focused Attention: Appears intact Sustained Attention: Appears intact Selective Attention: Impaired Selective Attention Impairment: Verbal basic, Functional basic Memory: Impaired Memory Impairment: Retrieval deficit, Decreased short term memory Decreased Short Term Memory: Verbal basic Awareness: Appears intact Problem Solving: Impaired Problem Solving Impairment: Verbal basic, Functional basic Cognition Arousal/Alertness: Awake/alert (initially lethargic, arouses to verbal and tactile stimuli) Behavior During Therapy: Flat affect Overall Cognitive Status: Impaired/Different from baseline Area of Impairment: Attention, Memory, Following commands, Safety/judgement, Awareness, Problem solving Current Attention Level: Sustained Memory: Decreased recall of precautions, Decreased short-term memory Following Commands: Follows one step commands with increased time Safety/Judgement: Decreased awareness of safety, Decreased awareness of deficits Awareness: Intellectual Problem Solving: Slow processing, Requires verbal cues   Blood pressure (!) 110/49, pulse (!) 44, temperature 98.9 F (37.2 C), temperature source Oral, resp. rate 12, weight 88.2 kg, SpO2 95 %. Physical Exam Vitals reviewed.  Constitutional:      Appearance: She is  obese.  HENT:     Head: Normocephalic and atraumatic.     Right Ear: External ear normal.     Left Ear: External ear normal.     Nose: Nose normal.  Eyes:     General:        Right eye: No discharge.        Left eye: No discharge.     Comments: Fixed gaze  Cardiovascular:     Rate and Rhythm: Regular rhythm. Bradycardia present.  Pulmonary:     Effort: Pulmonary effort is normal. No respiratory distress.     Breath sounds: No stridor.  Abdominal:     General: Abdomen is flat. There is no distension.  Musculoskeletal:  General: No tenderness.     Cervical back: Normal range of motion and neck supple.     Comments: LE edema  Skin:    General: Skin is warm and dry.  Neurological:     Mental Status: She is alert.     Comments: Alert and oriented x3 Dysarthria  Makes good eye contact with examiner.   Follows simple commands. Motor: RUE/RLE: 4 -/5 proximal distal LUE/LLE: 4/5 proximal distal Left facial weakness  Psychiatric:        Attention and Perception: Attention normal.        Mood and Affect: Affect is flat.        Lab Results Last 24 Hours  No results found for this or any previous visit (from the past 24 hour(s)).    Imaging Results (Last 48 hours)  CT Code Stroke CTA Head W/WO contrast   Result Date: 02/16/2020 CLINICAL DATA:  Right facial droop EXAM: CT ANGIOGRAPHY HEAD AND NECK TECHNIQUE: Multidetector CT imaging of the head and neck was performed using the standard protocol during bolus administration of intravenous contrast. Multiplanar CT image reconstructions and MIPs were obtained to evaluate the vascular anatomy. Carotid stenosis measurements (when applicable) are obtained utilizing NASCET criteria, using the distal internal carotid diameter as the denominator. CONTRAST:  75 mL Omnipaque 350 COMPARISON:  None. FINDINGS: CTA NECK Aortic arch: Great vessel origins are patent. Right carotid system: Patent. Minimal calcified plaque at the ICA origin without  measurable stenosis. Mild tortuosity of the distal cervical ICA. Left carotid system: Patent. Trace calcified plaque at the ICA origin without measurable stenosis. Mild tortuosity of the distal cervical ICA. Vertebral arteries: Patent.  No measurable stenosis. Skeleton: Cervical spine degenerative changes. Other neck: No mass or adenopathy. Upper chest: No apical lung mass. Dilatation of the main pulmonary artery reflecting pulmonary arterial hypertension. Review of the MIP images confirms the above findings CTA HEAD Anterior circulation: Intracranial internal carotid arteries are patent with mild atherosclerotic irregularity. Anterior and middle cerebral arteries are patent. Posterior circulation: Intracranial vertebral arteries, basilar artery, and posterior cerebral arteries are patent. PICA, AICA, and superior cerebellar artery origins are patent. There are patent bilateral posterior communicating arteries. Venous sinuses: Patent as allowed by contrast bolus timing. Review of the MIP images confirms the above findings IMPRESSION: No large vessel occlusion or hemodynamically significant stenosis. Electronically Signed   By: Guadlupe Spanish M.D.   On: 02/16/2020 22:02    CT HEAD WO CONTRAST   Result Date: 02/17/2020 CLINICAL DATA:  79 year old female code stroke presentation yesterday, right facial droop. Small age indeterminate left cerebellar SCA infarct on plain head CT. CTA negative for large vessel occlusion or stenosis. EXAM: CT HEAD WITHOUT CONTRAST TECHNIQUE: Contiguous axial images were obtained from the base of the skull through the vertex without intravenous contrast. COMPARISON:  CT head, CTA head and neck yesterday, and earlier. 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 No other intracranial mass effect. No midline shift. No ventriculomegaly. Stable gray-white matter differentiation throughout the brain. Small left SCA territory infarct appears unchanged.  Largely normal for age gray-white differentiation elsewhere. No cortically based acute infarct identified. No acute intracranial hemorrhage identified. Vascular: Mild Calcified atherosclerosis at the skull base. Skull: No acute osseous abnormality identified. Hyperostosis of the calvarium. Sinuses/Orbits: Visualized paranasal sinuses and mastoids are stable and well pneumatized. Other: No acute orbit or scalp soft tissue findings. IMPRESSION: 1. Stable since yesterday. No cortically based infarct or acute intracranial hemorrhage identified.  2. Small left SCA territory infarct, new since 2018 but favor chronic. Electronically Signed   By: [name] M.D.   On: 02/17/2020 12:45    CT Code Stroke CTA Neck W/WO contrast   Result Date: 02/16/2020 CLINICAL DATA:  Right facial droop EXAM: CT ANGIOGRAPHY HEAD AND NECK TECHNIQUE: Multidetector CT imaging of the head and neck was performed using the standard protocol during bolus administration of intravenous contrast. Multiplanar CT image reconstructions and MIPs were obtained to evaluate the vascular anatomy. Carotid stenosis measurements (when applicable) are obtained utilizing NASCET criteria, using the distal internal carotid diameter as the denominator. CONTRAST:  75 mL Omnipaque 350 COMPARISON:  None. FINDINGS: CTA NECK Aortic arch: Great vessel origins are patent. Right carotid system: Patent. Minimal calcified plaque at the ICA origin without measurable stenosis. Mild tortuosity of the distal cervical ICA. Left carotid system: Patent. Trace calcified plaque at the ICA origin without measurable stenosis. Mild tortuosity of the distal cervical ICA. Vertebral arteries: Patent.  No measurable stenosis. Skeleton: Cervical spine degenerative changes. Other neck: No mass or adenopathy. Upper chest: No apical lung mass. Dilatation of the main pulmonary artery reflecting pulmonary arterial hypertension. Review of the MIP images confirms the above findings CTA HEAD Anterior  circulation: Intracranial internal carotid arteries are patent with mild atherosclerotic irregularity. Anterior and middle cerebral arteries are patent. Posterior circulation: Intracranial vertebral arteries, basilar artery, and posterior cerebral arteries are patent. PICA, AICA, and superior cerebellar artery origins are patent. There are patent bilateral posterior communicating arteries. Venous sinuses: Patent as allowed by contrast bolus timing. Review of the MIP images confirms the above findings IMPRESSION: No large vessel occlusion or hemodynamically significant stenosis. Electronically Signed   By: Guadlupe Spanish M.D.   On: 02/16/2020 22:02    MR BRAIN WO CONTRAST   Result Date: 02/17/2020 CLINICAL DATA:  Sudden onset of slurred speech and double vision EXAM: MRI HEAD WITHOUT CONTRAST TECHNIQUE: Multiplanar, multiecho pulse sequences of the brain and surrounding structures were obtained without intravenous contrast. COMPARISON:  02/02/2014 brain MRI FINDINGS: Brain: There is an acute infarct of the pons, left-greater-than-right. There is also punctate focus of acute ischemia in the left cerebellum. No acute ischemia of the midbrain or above. There is multifocal periventricular white matter hyperintensity, most often a result of chronic microvascular ischemia. Normal CSF volume. Small amount of hemosiderin at the inferior pons. Foci of chronic microhemorrhage in the left parietal and occipital lobes. Vascular: Normal flow voids. Skull and upper cervical spine: Normal marrow signal. Sinuses/Orbits: Negative. Other: None. IMPRESSION: 1. Acute bilateral pontine infarcts, left-greater-than-right. 2. Punctate focus of acute ischemia in the left cerebellum. 3. Findings of chronic microvascular ischemia. Electronically Signed   By: Deatra Robinson M.D.   On: 02/17/2020 22:45    CT HEAD CODE STROKE WO CONTRAST   Result Date: 02/16/2020 CLINICAL DATA:  Code stroke.  Right facial droop EXAM: CT HEAD WITHOUT CONTRAST  TECHNIQUE: Contiguous axial images were obtained from the base of the skull through the vertex without intravenous contrast. COMPARISON:  2018 FINDINGS: Brain: There is no acute intracranial hemorrhage, mass effect, or edema. No new loss of gray-white differentiation. There is an age-indeterminate small infarct of the superior left cerebellum. Ventricles and sulci are normal in size and configuration. Patchy hypoattenuation in the supratentorial white matter is nonspecific but may reflect mild chronic microvascular ischemic changes. There is no extra-axial fluid collection Vascular: There is no hyperdense vessel. Mild intracranial atherosclerotic calcification at the skull base. Skull: Unremarkable. Sinuses/Orbits:  No acute abnormality. Other: Mastoid air cells are clear. ASPECTS (Alberta Stroke Program Early CT Score) - Ganglionic level infarction (caudate, lentiform nuclei, internal capsule, insula, M1-M3 cortex): 7 - Supraganglionic infarction (M4-M6 cortex): 3 Total score (0-10 with 10 being normal): 10 IMPRESSION: There is no acute intracranial hemorrhage or evidence of definite acute infarction. ASPECT score is 10. New age-indeterminate small infarct of the superior left cerebellum. These results were communicated to Dr. Laurence Slate at 9:50 pmon 7/7/2021by text page via the [facility] messaging system. Electronically Signed   By: Guadlupe Spanish M.D.   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  Records reviewed and summated above.   1. Does the need for close, 24 hr/day medical supervision in concert with the patient's rehab  needs make it unreasonable for this patient to be served in a less intensive setting? Yes  2. Co-Morbidities requiring supervision/potential complications: HTN (monitor and provide prns in accordance with increased physical exertion and pain), hyperlipidemia, DVT (?  On no meds PTA),Takotsubo cardiomyopathy, obstructive sleep apnea (Cont CPAP, monitor for daytime somnolence), leukocytosis (repeat labs, cont to monitor for signs and symptoms of infection, further workup if indicated), dementia (continue meds) 3. Due to bowel management, safety, skin/wound care, disease management, medication administration, pain management and patient education, does the patient require 24 hr/day rehab nursing? Yes 4. Does the patient require coordinated care of a physician, rehab nurse, therapy disciplines of PT/OT/SLP to address physical and functional deficits in the context of the above medical diagnosis(es)? Yes Addressing deficits in the following areas: balance, endurance, locomotion, strength, transferring, bathing, dressing, toileting, swallowing and psychosocial support 5. Can the patient actively participate in an intensive therapy program of at least 3 hrs of therapy per day at least 5 days per week? Yes 6. The potential for patient to make measurable gains while on inpatient rehab is excellent 7. Anticipated functional outcomes upon discharge from inpatient rehab are min assist  with PT, min assist with OT, independent with SLP. 8. Estimated rehab length of stay to reach the above functional goals is: 22-26 days. 9. Anticipated discharge destination: Home 10. Overall Rehab/Functional Prognosis: good   RECOMMENDATIONS: This patient's condition is appropriate for continued rehabilitative care in the following setting: CIR if adequate caregiver support available upon discharge. Patient has agreed to participate in recommended program. Yes Note that insurance prior authorization may be required for  reimbursement for recommended care.   Comment: Rehab Admissions Coordinator to follow up.   I have personally performed a face to face diagnostic evaluation, including, but not limited to relevant history and physical exam findings, of this patient and developed relevant assessment and plan.  Additionally, I have reviewed and concur with the physician assistant's documentation above.    Maryla Morrow, MD, ABPMR Sarah Rossetti Angiulli, PA-C 02/18/2020        Revision History                     Routing History           Note Details  Author Allena Katz, Maryln Gottron, MD File Time 02/18/2020 11:37 AM  Author Type Physician Status Signed  Last Editor Sarah Fennel, MD Service Physical Medicine and [facility] Acct # [id number] Admit Date 02/21/2020

## 2020-02-21 NOTE — H&P (Signed)
Physical Medicine and Rehabilitation Admission H&P    Chief Complaint  Patient presents with  . 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  Per chart review patient lives with her granddaughter.  1 level home 6 steps to entry.  Her granddaughter works at night however patient states that she has a son in the area as well as a daughter-in-law that works different times.  Patient ambulates with a rolling walker for household distances prior to admission.  Granddaughter does assist with some bathing and ADLs.  Presented 02/16/2020 with left facial droop dysarthria and visual changes.  MRI of the brain showed left greater than right brainstem infarction.  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  Punctate focus of acute ischemia in the left cerebellum.  CT angiogram of head and neck with no large vessel occlusion.  Patient did receive TPA.  Echocardiogram from 12/30/2019 showed ejection fraction 55 to 60% and no source of embolus.  Admission chemistries unremarkable.  EKG normal sinus rhythm.  Maintain on aspirin 81 mg daily and Plavix  daily for CVA prophylaxis x3 weeks then Plavix alone.  Subcutaneous Lovenox for DVT prophylaxis.  Venous Dopplers lower extremities negative.  Plan is for loop recorder placement 02/21/2020.  She is on a dysphagia #2 thin liquid diet.  Patient was admitted for a comprehensive rehab program.  Review of Systems  Constitutional: Positive for malaise/fatigue. Negative for chills and fever.  HENT: Negative for hearing loss.   Eyes: Positive for blurred vision.  Respiratory: Negative for cough and shortness of breath.   Cardiovascular: Positive for leg swelling. Negative  for chest pain and palpitations.  Gastrointestinal: Positive for constipation. Negative for heartburn, nausea and vomiting.  Genitourinary: Negative for dysuria, flank pain and hematuria.  Musculoskeletal: Positive for myalgias.  Skin: Negative for rash.  Neurological: Positive for speech change and weakness.  Psychiatric/Behavioral: Positive for depression and memory loss. The patient has insomnia.   All other systems reviewed and are negative.  Past Medical History:  Diagnosis Date  . Arthritis   . Asthma   . Chronic back pain   . Depression   . History of pulmonary embolus (PE)   . Hx-TIA (transient ischemic attack)   . Hyperlipemia   . Hypertension   . Personal history of DVT (deep vein thrombosis)   . Scoliosis   . Sleep apnea    cpap - settingsi at 3   . 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 ARTHROSCOPIC REPAIR ACL  02/03/2004  . LEFT HEART CATH AND CORONARY ANGIOGRAPHY N/A 09/16/2019   Procedure: LEFT HEART CATH AND CORONARY ANGIOGRAPHY;  Surgeon: Yvonne Kendall, MD;  Location: [facility];  Service: Cardiovascular;  Laterality: N/A;  . OOPHORECTOMY  1970  . SPINE SURGERY  03/31/2006  . TOTAL KNEE ARTHROPLASTY Left 05/17/2016   Procedure: LEFT TOTAL KNEE ARTHROPLASTY;  Surgeon: Kathryne Hitch, MD;  Location: [facility];  Service: Orthopedics;  Laterality: Left;  Adductor Block; Spinal to General  . TUBAL LIGATION  1983   Family History  Problem Relation Age of Onset  . Other Father        kidney problems   Social History:  reports  that she quit smoking about 46 years ago. Her smoking use included cigarettes. She has a 10.00 pack-year smoking history. She has never used smokeless tobacco. She reports that she does not drink alcohol and does not use drugs. Allergies:  Allergies  Allergen Reactions  . T-Pa [Alteplase] Swelling    Lip swelling immediately post tPA  . Codeine Itching   Medications Prior  to Admission  Medication Sig Dispense Refill  . amLODipine (NORVASC) 10 MG tablet Take 10 mg by mouth daily.    Marland Kitchen aspirin EC 81 MG EC tablet Take 1 tablet (81 mg total) by mouth daily. 90 tablet 3  . atorvastatin (LIPITOR) 20 MG tablet Take 20 mg by mouth at bedtime.     . busPIRone (BUSPAR) 30 MG tablet Take 1 tablet (30 mg total) by mouth in the morning and at bedtime. (Patient taking differently: Take 15 mg by mouth in the morning and at bedtime. ) 180 tablet 0  . cholecalciferol (VITAMIN D3) 25 MCG (1000 UT) tablet Take 1,000 Units by mouth daily.    . cycloSPORINE (RESTASIS) 0.05 % ophthalmic emulsion Place 1 drop into both eyes daily as needed (dry eyes).    Marland Kitchen donepezil (ARICEPT) 10 MG tablet Take 10 mg by mouth at bedtime.     . fexofenadine (ALLEGRA) 180 MG tablet Take 180 mg by mouth daily.    . furosemide (LASIX) 40 MG tablet Take 40 mg by mouth as needed for edema. PRN    . linaclotide (LINZESS) 145 MCG CAPS capsule Take 145 mcg by mouth as needed (constipation). Every other day    . lisinopril (PRINIVIL,ZESTRIL) 40 MG tablet Take 40 mg by mouth daily.     . memantine (NAMENDA XR) 28 MG CP24 24 hr capsule Take 28 mg by mouth daily.   0  . Multiple Vitamins-Minerals (MULTI-VITAMIN GUMMIES PO) Take 1 each by mouth daily.     . nitroGLYCERIN (NITROSTAT) 0.4 MG SL tablet Place 1 tablet (0.4 mg total) under the tongue every 5 (five) minutes as needed for chest pain. 25 tablet 2  . oxyCODONE (ROXICODONE) 15 MG immediate release tablet Take 7.5 mg by mouth 4 (four) times daily as needed for pain.     . potassium chloride (KLOR-CON) 10 MEQ tablet Take 10 mEq by mouth daily as needed (potassium).     . zinc gluconate 50 MG tablet Take 25 mg by mouth daily.      Drug Regimen Review Drug regimen was reviewed and remains appropriate with no significant issues identified  Home: Home Living Family/patient expects to be discharged to:: Private residence Living Arrangements: Other relatives  (gdtr) Available Help at Discharge: Family, Available PRN/intermittently (gdtr works nights per pt) Type of Home: House Home Access: Stairs to enter Secretary/administrator of Steps: 6 Entrance Stairs-Rails: Right, Left Home Layout: One level Bathroom Shower/Tub: Engineer, manufacturing systems: Standard Home Equipment: Environmental consultant - 2 wheels, Wheelchair - manual Additional Comments: Pt reports she lives with her grand daughter who works at night    Functional History: Prior Function Level of Independence: Needs Technical sales engineer / Transfers Assistance Needed: pt reports ambulating with a RW for household distances ADL's / Engineer, water Assistance Needed: Pt reports grand daughter assists her with bathing, and all IADLs   Functional Status:  Mobility: Bed Mobility Overal bed mobility: Needs Assistance Bed Mobility: Supine to Sit Supine to sit: Min guard, HOB elevated Transfers Overall transfer level: Needs assistance Equipment used: Rolling walker (2 wheeled), 1 person hand held  assist Transfers: Sit to/from Stand, Stand Pivot Transfers Sit to Stand: Mod assist Stand pivot transfers: Mod assist General transfer comment: pt performs 2 stand pivot transfers with PT assist, 3 sit to stands with use of RW Ambulation/Gait Ambulation/Gait assistance: Mod assist Gait Distance (Feet): 1 Feet Assistive device: Rolling walker (2 wheeled) Gait Pattern/deviations: Step-to pattern General Gait Details: pt with short steps, taking one step forward with each foot, otherwise working on pre-gait marching and weight shifting Gait velocity: reduced Gait velocity interpretation: <1.31 ft/sec, indicative of household ambulator    ADL: ADL Overall ADL's : Needs assistance/impaired Eating/Feeding: Minimal assistance, Sitting Grooming: Wash/dry hands, Wash/dry face, Oral care, Brushing hair, Moderate assistance, Sitting Upper Body Bathing: Moderate assistance, Sitting Lower Body Bathing: Maximal  assistance, Sit to/from stand Upper Body Dressing : Maximal assistance, Sitting Lower Body Dressing: Total assistance, Sit to/from stand Toilet Transfer: Total assistance Toilet Transfer Details (indicate cue type and reason): unable to safely attempt with +1 assist  Toileting- Clothing Manipulation and Hygiene: Total assistance, Sit to/from stand Functional mobility during ADLs: Total assistance General ADL Comments: Pt fatigued.  Heavy rt lateral lean   Cognition: Cognition Overall Cognitive Status: Impaired/Different from baseline Orientation Level: Oriented X4 Attention: Focused, Selective, Sustained Focused Attention: Appears intact Sustained Attention: Appears intact Selective Attention: Impaired Selective Attention Impairment: Verbal basic, Functional basic Memory: Impaired Memory Impairment: Retrieval deficit, Decreased short term memory Decreased Short Term Memory: Verbal basic Awareness: Appears intact Problem Solving: Impaired Problem Solving Impairment: Verbal basic, Functional basic Cognition Arousal/Alertness: Awake/alert Behavior During Therapy: WFL for tasks assessed/performed Overall Cognitive Status: Impaired/Different from baseline Area of Impairment: Attention, Memory, Safety/judgement, Awareness Current Attention Level: Sustained Memory: Decreased recall of precautions Following Commands: Follows one step commands consistently Safety/Judgement: Decreased awareness of deficits Awareness: Emergent Problem Solving: Requires verbal cues  Physical Exam: Blood pressure 130/71, pulse (!) 58, temperature 98.1 F (36.7 C), temperature source Oral, resp. rate 16, weight 88.2 kg, SpO2 96 %. General: Alert and oriented x 3, No apparent distress HEENT: Head is normocephalic, atraumatic, left eye with limited movement, right eye with medial rectus palsy Neck: Supple without JVD or lymphadenopathy Heart: Reg rate and rhythm. No murmurs rubs or gallops Chest: CTA  bilaterally without wheezes, rales, or rhonchi; no distress Abdomen: Soft, non-tender, non-distended, bowel sounds positive. Extremities: No clubbing, cyanosis, or edema. Pulses are 2+ Skin: Clean and intact without signs of breakdown Neuro: Patient is alert sitting up in bed.  Makes good eye contact with examiner.  Mild left facial droop.  Speech is dysarthric but intelligible.  She is oriented x3 and follows commands. 4+/5 bilateral LE strength RUE: 4/5 SA, EF, EE, 4/5 hand grip LUE: 4/5 throughout  RUE impaired coordination.  Sensation intact Psych: Pt's affect is appropriate. Pt is cooperative  Results for orders placed or performed during the hospital encounter of 02/16/20 (from the past 48 hour(s))  Basic metabolic panel     Status: None   Collection Time: 02/20/20  4:41 AM  Result Value Ref Range   Sodium 140 135 - 145 mmol/L   Potassium 3.5 3.5 - 5.1 mmol/L   Chloride 104 98 - 111 mmol/L   CO2 24 22 - 32 mmol/L   Glucose, Bld 86 70 - 99 mg/dL    Comment: Glucose reference range applies only to samples taken after fasting for at least 8 hours.   BUN 11 8 - 23 mg/dL   Creatinine, Ser 1.32 0.44 - 1.00 mg/dL   Calcium 9.3 8.9 -  10.3 mg/dL   GFR calc non Af Amer >60 >60 mL/min   GFR calc Af Amer >60 >60 mL/min   Anion gap 12 5 - 15    Comment: Performed at [facility], [address] [place], [place] [address]  CBC     Status: Abnormal   Collection Time: 02/20/20  4:41 AM  Result Value Ref Range   WBC 8.3 4.0 - 10.5 K/uL   RBC 3.78 (L) 3.87 - 5.11 MIL/uL   Hemoglobin 12.0 12.0 - 15.0 g/dL   HCT 98.3 36 - 46 %   MCV 98.4 80.0 - 100.0 fL   MCH 31.7 26.0 - 34.0 pg   MCHC 32.3 30.0 - 36.0 g/dL   RDW 38.2 50.5 - 39.7 %   Platelets 163 150 - 400 K/uL   nRBC 0.0 0.0 - 0.2 %    Comment: Performed at [facility], [address], [place], [place] [address]  Basic metabolic panel     Status: Abnormal   Collection Time: 02/21/20  4:42 AM  Result Value Ref Range    Sodium 140 135 - 145 mmol/L   Potassium 3.5 3.5 - 5.1 mmol/L   Chloride 104 98 - 111 mmol/L   CO2 24 22 - 32 mmol/L   Glucose, Bld 83 70 - 99 mg/dL    Comment: Glucose reference range applies only to samples taken after fasting for at least 8 hours.   BUN 21 8 - 23 mg/dL   Creatinine, Ser 9.37 (H) 0.44 - 1.00 mg/dL   Calcium 9.1 8.9 - 90.2 mg/dL   GFR calc non Af Amer 43 (L) >60 mL/min   GFR calc Af Amer 49 (L) >60 mL/min   Anion gap 12 5 - 15    Comment: Performed at [facility], [address], [place], [place] [address]  CBC     Status: Abnormal   Collection Time: 02/21/20  4:42 AM  Result Value Ref Range   WBC 9.4 4.0 - 10.5 K/uL   RBC 3.96 3.87 - 5.11 MIL/uL   Hemoglobin 12.7 12.0 - 15.0 g/dL   HCT 53.2 36 - 46 %   MCV 100.5 (H) 80.0 - 100.0 fL   MCH 32.1 26.0 - 34.0 pg   MCHC 31.9 30.0 - 36.0 g/dL   RDW 99.2 42.6 - 83.4 %   Platelets 152 150 - 400 K/uL   nRBC 0.0 0.0 - 0.2 %    Comment: Performed at [facility], [address], [place], [place] [address]   VAS [place] LOWER EXTREMITY VENOUS (DVT)  Result Date: 02/21/2020  Lower Venous DVT Study Indications: Stroke.  Limitations: Postioning secondary to stroke. Comparison Study: Prior study from 09/30/16 is available for comparison Performing Technologist: Sherren Kerns RVS  Examination Guidelines: A complete evaluation includes B-mode imaging, spectral Doppler, color Doppler, and power Doppler as needed of all accessible portions of each vessel. Bilateral testing is considered an integral part of a complete examination. Limited examinations for reoccurring indications may be performed as noted. The reflux portion of the exam is performed with the patient in reverse Trendelenburg.  +---------+---------------+---------+-----------+----------+--------------+ RIGHT    CompressibilityPhasicitySpontaneityPropertiesThrombus Aging +---------+---------------+---------+-----------+----------+--------------+ CFV      Full            Yes      Yes                                 +---------+---------------+---------+-----------+----------+--------------+  SFJ      Full                                                        +---------+---------------+---------+-----------+----------+--------------+ FV Prox  Full                                                        +---------+---------------+---------+-----------+----------+--------------+ FV Mid   Full                                                        +---------+---------------+---------+-----------+----------+--------------+ FV DistalFull                                                        +---------+---------------+---------+-----------+----------+--------------+ PFV      Full                                                        +---------+---------------+---------+-----------+----------+--------------+ POP      Full           Yes      Yes                                 +---------+---------------+---------+-----------+----------+--------------+ PTV      Full                                                        +---------+---------------+---------+-----------+----------+--------------+ PERO     Full                                                        +---------+---------------+---------+-----------+----------+--------------+   +---------+---------------+---------+-----------+----------+--------------+ LEFT     CompressibilityPhasicitySpontaneityPropertiesThrombus Aging +---------+---------------+---------+-----------+----------+--------------+ CFV      Full           Yes      Yes                                 +---------+---------------+---------+-----------+----------+--------------+ SFJ      Full                                                        +---------+---------------+---------+-----------+----------+--------------+  FV Prox  Full                                                         +---------+---------------+---------+-----------+----------+--------------+ FV Mid   Full                                                        +---------+---------------+---------+-----------+----------+--------------+ FV DistalFull                                                        +---------+---------------+---------+-----------+----------+--------------+ PFV      Full                                                        +---------+---------------+---------+-----------+----------+--------------+ POP      Full           Yes      Yes                                 +---------+---------------+---------+-----------+----------+--------------+ PTV      Full                                                        +---------+---------------+---------+-----------+----------+--------------+ PERO     Full                                                        +---------+---------------+---------+-----------+----------+--------------+   Left Technical Findings: Not visualized segments include portions of the posterior tibial and peroneal veins.   Summary: RIGHT: - Findings appear essentially unchanged compared to previous examination. - There is no evidence of deep vein thrombosis in the lower extremity.  LEFT: - Findings appear essentially unchanged compared to previous examination. - There is no evidence of deep vein thrombosis in the lower extremity. However, portions of this examination were limited- see technologist comments above.  *See table(s) above for measurements and observations. Electronically signed by Coral Else MD on 02/21/2020 at 6:32:48 AM.    Final        Medical Problem List and Plan: 1.  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  Status post TPA.  Status post loop recorder placement  -patient may shower but loop recorder must be covered.   -ELOS/Goals:  min assist  with PT, min assist with OT, independent with SLP 20-24 days 2.  Antithrombotics: -  DVT/anticoagulation: Lovenox.  Vascular study negative  -antiplatelet therapy: Aspirin 81 mg daily and Plavix 85 mg daily x3 weeks then Plavix alone 3. Pain Management: Oxycodone 15 mg every 4 hours as needed chronic pain 4. Mood: Melatonin 3 mg nightly, Remeron 7.5 mg nightly as needed,  BuSpar 30 mg twice daily  -antipsychotic agents: N/A 5. Neuropsych: This patient is capable of making decisions on her own behalf. 6. Skin/Wound Care: Routine skin checks 7. Fluids/Electrolytes/Nutrition: Routine in and outs with follow-up chemistries. On daily multivitamin at home- will reorder 8.  Hypertension.  Lisinopril 10 mg daily.  Monitor with increased mobility  7/12: BP and pulse have been low. Can consider decreasing Lisinopril. Was also on Amlodipine  daily at home.  9.  Hyperlipidemia.  Lipitor 10.  Obesity.  BMI 35.56.  Follow-up dietary 11.  OSA.  Patient on CPAP at home. 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  Follow-up outpatient cardiology service 13. Dementia: Namenda 28 mg daily, Aricept 10 mg nightly.  Mcarthur Rossetti Angiulli, PA-C 02/21/2020   I have personally performed a face to face diagnostic evaluation, including, but not limited to relevant history and physical exam findings, of this patient and developed relevant assessment and plan.  Additionally, I have reviewed and concur with the physician assistant's documentation above.  Sula Soda, MD

## 2020-02-21 NOTE — Plan of Care (Signed)
  Problem: RH SAFETY Goal: RH STG ADHERE TO SAFETY PRECAUTIONS W/ASSISTANCE/DEVICE Description: STG Adhere to Safety Precautions With supervision  Outcome: Progressing Goal: RH STG DECREASED RISK OF FALL WITH ASSISTANCE Description: STG Decreased Risk of Fall With mod I. Outcome: Progressing   Problem: RH COGNITION-NURSING Goal: RH STG USES MEMORY AIDS/STRATEGIES W/ASSIST TO PROBLEM SOLVE Description: STG Uses Memory Aids/Strategies With Assistance to Problem Solve. Outcome: Progressing Goal: RH STG ANTICIPATES NEEDS/CALLS FOR ASSIST W/ASSIST/CUES Description: STG Anticipates Needs/Calls for Assist With Assistance/Cues. Outcome: Progressing

## 2020-02-21 NOTE — PMR Pre-admission (Signed)
PMR Admission Coordinator Pre-Admission Assessment  Patient: [name] is an 79 y.o., female MRN: [id number] DOB: [birth year] Height:   Weight: 88.2 kg              Insurance Information HMO:     PPO:      PCP:      IPA:      80/20:      OTHER:  PRIMARY: Medicare a and b      Policy#: [id number]      Subscriber: pt  Benefits:  Phone #: passport one online     Name: 7/12 Eff. Date: a 06/13/91 and b 10/11/94     Deduct: $1484      Out of Pocket Max: none      Life Max: none  CIR: 100%      SNF: 20 full days Outpatient: 80%     Co-Pay: 20% Home Health: 100%      Co-Pay: none DME: 80%     Co-Pay: 20% Providers: pt choice  SECONDARY: [employer]      Policy#: [id number]      Phone#: pt  Artist:       Phone#:   The "Data Collection Information Summary" for patients in Inpatient Rehabilitation Facilities with attached "Privacy Act Statement-Health Care Records" was provided and verbally reviewed with: Patient and Family  Emergency Contact Information Contact Information    Name Relation Home Work Mobile   [place] Son [phone]     Cooper,Carolyn Granddaughter [phone]     Decker,Marilyn Other [phone]       Current Medical History  Patient Admitting Diagnosis: CVA  History of Present Illness:  79 year old right-handed female with history of hypertension, hyperlipidemia, mild dementia maintained on Aricept as well as Namenda, chronic back pain maintained on oxycodone 15 mg every 4 hours as needed, DVT, Takotsubo cardiomyopathy, 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   Presented 02/16/2020 with left facial droop dysarthria and visual changes.  MRI of the brain showed left greater than right brainstem infarction.  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  Punctate focus of acute ischemia in the left cerebellum.  CT angiogram of head and neck with no large vessel occlusion.  Patient did receive TPA.   Echocardiogram from 12/30/2019 showed ejection fraction 55 to 60% and no source of embolus.  Admission chemistries unremarkable.  EKG normal sinus rhythm.  Maintain on aspirin 81 mg daily and Plavix  daily for CVA prophylaxis x3 weeks then Plavix alone.  Subcutaneous Lovenox for DVT prophylaxis.  Venous Dopplers lower extremities negative.  Plan is for loop recorder placement 02/21/2020.  She is on a dysphagia #2 thin liquid diet.    Complete NIHSS TOTAL: 7 Glasgow Coma Scale Score: 15  Past Medical History  Past Medical History:  Diagnosis Date  . Arthritis   . Asthma   . Chronic back pain   . Depression   . History of pulmonary embolus (PE)   . Hx-TIA (transient ischemic attack)   . Hyperlipemia   . Hypertension   . Personal history of DVT (deep vein thrombosis)   . Scoliosis   . Sleep apnea    cpap - settingsi at 3   . 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    Family History  family history includes Other in her father.  Prior Rehab/Hospitalizations:  Has the patient had prior rehab or hospitalizations prior to admission? Yes  Has the patient had major surgery during 100 days  prior to admission? Yes  Current Medications   Current Facility-Administered Medications:  .  acetaminophen (TYLENOL) tablet 650 mg, 650 mg, Oral, Q4H PRN, 650 mg at 02/19/20 1349 **OR** acetaminophen (TYLENOL) 160 MG/5ML solution 650 mg, 650 mg, Per Tube, Q4H PRN **OR** acetaminophen (TYLENOL) suppository 650 mg, 650 mg, Rectal, Q4H PRN, Layne Benton, NP .  artificial tears (LACRILUBE) ophthalmic ointment, , Left Eye, QHS, Marvel Plan, MD, Given at 02/20/20 2200 .  aspirin EC tablet 81 mg, 81 mg, Oral, Daily, Biby, Sharon L, NP, 81 mg at 02/21/20 1004 .  atorvastatin (LIPITOR) tablet 20 mg, 20 mg, Oral, QHS, Biby, Sharon L, NP, 20 mg at 02/20/20 2149 .  busPIRone (BUSPAR) tablet 30 mg, 30 mg, Oral, BID, Biby, Sharon L, NP, 30 mg at 02/21/20 0907 .  Chlorhexidine Gluconate Cloth 2 % PADS 6 each, 6 each,  Topical, Daily, Biby, Sharon L, NP, 6 each at 02/21/20 1004 .  cholecalciferol (VITAMIN D3) tablet 1,000 Units, 1,000 Units, Oral, Daily, Biby, Sharon L, NP, 1,000 Units at 02/21/20 1002 .  clopidogrel (PLAVIX) tablet 75 mg, 75 mg, Oral, Daily, Biby, Sharon L, NP, 75 mg at 02/21/20 1003 .  cycloSPORINE (RESTASIS) 0.05 % ophthalmic emulsion 1 drop, 1 drop, Both Eyes, Daily PRN, Annie Main L, NP, 1 drop at 02/19/20 1231 .  donepezil (ARICEPT) tablet 10 mg, 10 mg, Oral, QHS, Biby, Sharon L, NP, 10 mg at 02/20/20 2149 .  enoxaparin (LOVENOX) injection 40 mg, 40 mg, Subcutaneous, Q24H, Biby, Sharon L, NP, 40 mg at 02/20/20 1026 .  lisinopril (ZESTRIL) tablet 10 mg, 10 mg, Oral, Daily, Marvel Plan, MD, 10 mg at 02/21/20 1002 .  melatonin tablet 3 mg, 3 mg, Oral, QHS, Aroor, Dara Lords, MD, 3 mg at 02/20/20 2150 .  memantine (NAMENDA XR) 24 hr capsule 28 mg, 28 mg, Oral, Daily, Biby, Sharon L, NP, 28 mg at 02/21/20 1003 .  mirtazapine (REMERON) tablet 7.5 mg, 7.5 mg, Oral, PRN, Arline Asp, NP, 7.5 mg at 02/19/20 2007 .  oxyCODONE (Oxy IR/ROXICODONE) immediate release tablet 15 mg, 15 mg, Oral, QID PRN, Layne Benton, NP, 15 mg at 02/21/20 0908 .  polyvinyl alcohol (LIQUIFILM TEARS) 1.4 % ophthalmic solution 2 drop, 2 drop, Left Eye, QID, Marvel Plan, MD, 2 drop at 02/21/20 1004 .  senna-docusate (Senokot-S) tablet 1 tablet, 1 tablet, Oral, QHS PRN, Layne Benton, NP  Patients Current Diet:  Diet Order            DIET DYS 2 Room service appropriate? Yes with Assist; Fluid consistency: Thin  Diet effective now                 Precautions / Restrictions Precautions Precautions: Fall Restrictions Weight Bearing Restrictions: No   Has the patient had 2 or more falls or a fall with injury in the past year?No  Prior Activity Level Limited Community (1-2x/wk): Mod I with RW; suprivsion for meds; Carloyn asissted with dressing  Prior Functional Level Prior Function Level of  Independence: Needs assistance Gait / Transfers Assistance Needed: pt reports ambulating with a RW for household distances ADL's / Homemaking Assistance Needed: Pt reports grand daughter assists her with bathing, and all IADLs   Self Care: Did the patient need help bathing, dressing, using the toilet or eating?  Needed some help  Indoor Mobility: Did the patient need assistance with walking from room to room (with or without device)? Independent  Stairs: Did the patient need assistance with  internal or external stairs (with or without device)? Independent  Functional Cognition: Did the patient need help planning regular tasks such as shopping or remembering to take medications? Needed some help  Home Assistive Devices / Equipment Home Equipment: Walker - 2 wheels, Wheelchair - manual  Prior Device Use: Indicate devices/aids used by the patient prior to current illness, exacerbation or injury? Walker  Current Functional Level Cognition  Overall Cognitive Status: Impaired/Different from baseline Current Attention Level: Sustained Orientation Level: Oriented X4 Following Commands: Follows one step commands consistently Safety/Judgement: Decreased awareness of deficits Attention: Focused, Selective, Sustained Focused Attention: Appears intact Sustained Attention: Appears intact Selective Attention: Impaired Selective Attention Impairment: Verbal basic, Functional basic Memory: Impaired Memory Impairment: Retrieval deficit, Decreased short term memory Decreased Short Term Memory: Verbal basic Awareness: Appears intact Problem Solving: Impaired Problem Solving Impairment: Verbal basic, Functional basic    Extremity Assessment (includes Sensation/Coordination)  Upper Extremity Assessment: Generalized weakness  Lower Extremity Assessment: Defer to PT evaluation    ADLs  Overall ADL's : Needs assistance/impaired Eating/Feeding: Minimal assistance, Sitting Grooming: Wash/dry hands,  Wash/dry face, Oral care, Brushing hair, Moderate assistance, Sitting Upper Body Bathing: Moderate assistance, Sitting Lower Body Bathing: Maximal assistance, Sit to/from stand Upper Body Dressing : Maximal assistance, Sitting Lower Body Dressing: Total assistance, Sit to/from stand Toilet Transfer: Total assistance Toilet Transfer Details (indicate cue type and reason): unable to safely attempt with +1 assist  Toileting- Clothing Manipulation and Hygiene: Total assistance, Sit to/from stand Functional mobility during ADLs: Total assistance General ADL Comments: Pt fatigued.  Heavy rt lateral lean     Mobility  Overal bed mobility: Needs Assistance Bed Mobility: Supine to Sit Supine to sit: Min guard, HOB elevated    Transfers  Overall transfer level: Needs assistance Equipment used: Rolling walker (2 wheeled), 1 person hand held assist Transfers: Sit to/from Stand, Stand Pivot Transfers Sit to Stand: Mod assist Stand pivot transfers: Mod assist General transfer comment: pt performs 2 stand pivot transfers with PT assist, 3 sit to stands with use of RW    Ambulation / Gait / Stairs / Wheelchair Mobility  Ambulation/Gait Ambulation/Gait assistance: Mod assist Gait Distance (Feet): 1 Feet Assistive device: Rolling walker (2 wheeled) Gait Pattern/deviations: Step-to pattern General Gait Details: pt with short steps, taking one step forward with each foot, otherwise working on pre-gait marching and weight shifting Gait velocity: reduced Gait velocity interpretation: <1.31 ft/sec, indicative of household ambulator    Posture / Balance Dynamic Sitting Balance Sitting balance - Comments: relaint on UE support Balance Overall balance assessment: Needs assistance Sitting-balance support: Single extremity supported, Feet supported Sitting balance-Leahy Scale: Poor Sitting balance - Comments: relaint on UE support Postural control: Posterior lean Standing balance support: Bilateral  upper extremity supported Standing balance-Leahy Scale: Poor Standing balance comment: modA to maintain static standing due to R lateral lean    Special needs/care consideration  Designated visitors are son, Harvie Heck and granddaughter, Eber Jones LOOP to be placed 7/12 CPAP pta   Previous Home Environment  Living Arrangements:  (granddaughter, Eber Jones , lives with patient)  Lives With: Other (Comment) Available Help at Discharge: Family, Available 24 hours/day Eber Jones and son, Harvie Heck will arrange 24/7 asisst after d/c ho) Type of Home: House Home Layout: One level Home Access: Stairs to enter Entrance Stairs-Rails: Right, Left Entrance Stairs-Number of Steps:  (6 up front but level entry at other entry) Bathroom Shower/Tub: Tub/shower unit, Door [employer]: Standard Bathroom Accessibility: Yes How Accessible: Accessible via walker Home Care Services: No  Additional Comments: granddaughter lives with patient and works night shift  Discharge Living Setting Plans for Discharge Living Setting: Patient's home (granddaughter lives with patient) Type of Home at Discharge: House Discharge Home Layout: One level Discharge Home Access: Stairs to enter Entrance Stairs-Rails: Right, Left, Can reach both Entrance Stairs-Number of Steps: 6 steps up front, but level entry to the side Discharge Bathroom Shower/Tub: Tub/shower unit, Door Discharge Bathroom Toilet: Standard Discharge Bathroom Accessibility: Yes How Accessible: Accessible via walker Does the patient have any problems obtaining your medications?: No  Social/Family/Support Systems Patient Roles: Parent Contact Information: Harvie Heck, son , is main contact Anticipated Caregiver: son and grand daughter Anticipated Caregiver's Contact Information: see above Ability/Limitations of Caregiver: son retired; granddaughter works nights Caregiver Availability: 24/7 Discharge Plan Discussed with Primary Caregiver: Yes Is Caregiver In  Agreement with Plan?: Yes Does Caregiver/Family have Issues with Lodging/Transportation while Pt is in Rehab?: No  Goals Patient/Family Goal for Rehab: Min PT and OT, supervision SLP Expected length of stay: elos 22 TO 26 DAYS Pt/Family Agrees to Admission and willing to participate: Yes Program Orientation Provided & Reviewed with Pt/Caregiver Including Roles  & Responsibilities: Yes  Decrease burden of Care through IP rehab admission: n/a  Possible need for SNF placement upon discharge:not anticipated  Patient Condition: This patient's condition remains as documented in the consult dated 7/9/20212, in which the Rehabilitation Physician determined and documented that the patient's condition is appropriate for intensive rehabilitative care in an inpatient rehabilitation facility. Will admit to inpatient rehab today.  Preadmission Screen Completed By:  Clois Dupes, RN, 02/21/2020 11:15 AM ______________________________________________________________________   Discussed status with Dr. Carlis Abbott on 02/21/2020 at 1115 and received approval for admission today.  Admission Coordinator:  Clois Dupes, time 9485 Date 02/21/2020

## 2020-02-21 NOTE — Progress Notes (Signed)
Inpatient Rehabilitation Admissions Coordinator  I met with patient at bedside and spoke with her son, [name], by phone. We discussed goals and expectations of a inpt rehab admit. He and his niece, Hoyle Sauer will provide caregiver support as recommended after a CIR admit. They are in agreement to admit today and I have a bed. I have notified Burnetta Sabin, [facility] of planned admit after LOOP placement. Son, [name], would like Medical to call him to update him on plan for LOOP as well as any other medical plans. His number is [phone]. I will make the arrangements to admit today after LOOP placement.  Danne Baxter, RN, MSN Rehab Admissions Coordinator [phone] 02/21/2020 10:51 AM

## 2020-02-21 NOTE — Discharge Instructions (Signed)
Wound care instructions, (heart monitor implant) Keep incision clean and dry for 3 days. You can remove outer dressing tomorrow. Leave steri-strips (little pieces of tape) on until seen in the office for wound check appointment. Call the office [phone]) for redness, drainage, swelling, or fever.

## 2020-02-21 NOTE — Discharge Summary (Addendum)
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, source unknown s/p tPA Active Problems:   OSA on CPAP   HTN (hypertension)   HLD (hyperlipidemia)   History of DVT (deep vein thrombosis)   Leukocytosis   Allergic reaction caused by a drug (tPA w/ lip swelling)   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   Dementia (HCC)   Bradycardia   AKI (acute kidney injury) (HCC)   Medications to be continued on Rehab Allergies as of 02/21/2020      Reactions   T-pa [alteplase] Swelling   Lip swelling immediately post tPA   Codeine Itching      Medication List    STOP taking these medications   amLODipine 10 MG tablet Commonly known as: NORVASC   fexofenadine 180 MG tablet Commonly known as: ALLEGRA   furosemide 40 MG tablet Commonly known as: LASIX   Linzess 145 MCG Caps capsule Generic drug: linaclotide   potassium chloride 10 MEQ tablet Commonly known as: KLOR-CON   zinc gluconate 50 MG tablet     TAKE these medications   acetaminophen 325 MG tablet Commonly known as: TYLENOL Take 2 tablets (650 mg total) by mouth every 4 (four) hours as needed for mild pain (or temp > 37.5 C (99.5 F)).   acetaminophen 160 MG/5ML solution Commonly known as: TYLENOL Place 20.3 mLs (650 mg total) into feeding tube every 4 (four) hours as needed for mild pain (or temp > 37.5 C (99.5 F)).   artificial tears Oint ophthalmic ointment Commonly known as: LACRILUBE Place into the left eye at bedtime.   aspirin 81 MG EC tablet Take 1 tablet (81 mg total) by mouth daily.   atorvastatin 20 MG  tablet Commonly known as: LIPITOR Take 20 mg by mouth at bedtime.   busPIRone 30 MG tablet Commonly known as: BUSPAR Take 0.5 tablets (15 mg total) by mouth in the morning and at bedtime.   cholecalciferol 25 MCG (1000 UNIT) tablet Commonly known as: VITAMIN D3 Take 1,000 Units by mouth daily.   clopidogrel 75 MG tablet Commonly known as: PLAVIX Take 1 tablet (75 mg total) by mouth daily. Start taking on: February 22, 2020   donepezil 10 MG tablet Commonly known as: ARICEPT Take 10 mg by mouth at bedtime.   enoxaparin 40 MG/0.4ML injection Commonly known as: LOVENOX Inject 0.4 mLs (40 mg total) into the skin daily. Start taking on: February 22, 2020   lisinopril 10 MG tablet Commonly known as: ZESTRIL Take 1 tablet (10 mg total) by mouth daily. Start taking on: February 22, 2020 What changed:   medication strength  how much to take   melatonin 3 MG Tabs tablet Take 1 tablet (3 mg total) by mouth at bedtime.   memantine 28 MG Cp24 24 hr capsule Commonly known as: NAMENDA XR Take 28 mg by mouth daily.   mirtazapine 7.5 MG tablet Commonly known as: REMERON Take 1 tablet (7.5 mg total) by mouth as needed (at bedtime).   MULTI-VITAMIN GUMMIES PO Take 1 each by mouth daily.   nitroGLYCERIN 0.4  MG SL tablet Commonly known as: NITROSTAT Place 1 tablet (0.4 mg total) under the tongue every 5 (five) minutes as needed for chest pain.   oxyCODONE 15 MG immediate release tablet Commonly known as: ROXICODONE Take 1 tablet (15 mg total) by mouth 4 (four) times daily as needed (chronic pain). What changed:   how much to take  reasons to take this   polyvinyl alcohol 1.4 % ophthalmic solution Commonly known as: LIQUIFILM TEARS Place 2 drops into the left eye 4 (four) times daily.   Restasis 0.05 % ophthalmic emulsion Generic drug: cycloSPORINE Place 1 drop into both eyes daily as needed (dry eyes). What changed: Another medication with the same name was removed. Continue  taking this medication, and follow the directions you see here.   senna-docusate 8.6-50 MG tablet Commonly known as: Senokot-S Take 1 tablet by mouth at bedtime as needed for mild constipation.       LABORATORY STUDIES CBC    Component Value Date/Time   WBC 9.4 02/21/2020 0442   RBC 3.96 02/21/2020 0442   HGB 12.7 02/21/2020 0442   HCT 39.8 02/21/2020 0442   PLT 152 02/21/2020 0442   MCV 100.5 (H) 02/21/2020 0442   MCH 32.1 02/21/2020 0442   MCHC 31.9 02/21/2020 0442   RDW 12.7 02/21/2020 0442   LYMPHSABS 0.9 02/16/2020 2142   MONOABS 0.6 02/16/2020 2142   EOSABS 0.0 02/16/2020 2142   BASOSABS 0.0 02/16/2020 2142   CMP    Component Value Date/Time   NA 140 02/21/2020 0442   K 3.5 02/21/2020 0442   CL 104 02/21/2020 0442   CO2 24 02/21/2020 0442   GLUCOSE 83 02/21/2020 0442   BUN 21 02/21/2020 0442   CREATININE 1.21 (H) 02/21/2020 0442   CALCIUM 9.1 02/21/2020 0442   PROT 7.3 02/16/2020 2142   ALBUMIN 3.8 02/16/2020 2142   AST 21 02/16/2020 2142   ALT 14 02/16/2020 2142   ALKPHOS 58 02/16/2020 2142   BILITOT 1.0 02/16/2020 2142   GFRNONAA 43 (L) 02/21/2020 0442   GFRAA 49 (L) 02/21/2020 0442   COAGS Lab Results  Component Value Date   INR 1.0 02/16/2020   Lipid Panel    Component Value Date/Time   CHOL 130 02/17/2020 0350   TRIG 51 02/17/2020 0350   HDL 50 02/17/2020 0350   CHOLHDL 2.6 02/17/2020 0350   VLDL 10 02/17/2020 0350   LDLCALC 70 02/17/2020 0350   HgbA1C  Lab Results  Component Value Date   HGBA1C 5.5 02/17/2020   Urinalysis    Component Value Date/Time   COLORURINE YELLOW 09/28/2016 1700   APPEARANCEUR CLEAR 09/28/2016 1700   LABSPEC 1.010 09/28/2016 1700   PHURINE 7.0 09/28/2016 1700   GLUCOSEU NEGATIVE 09/28/2016 1700   HGBUR MODERATE (A) 09/28/2016 1700   BILIRUBINUR NEGATIVE 09/28/2016 1700   KETONESUR NEGATIVE 09/28/2016 1700   PROTEINUR NEGATIVE 09/28/2016 1700   UROBILINOGEN 1.0 02/14/2012 1054   NITRITE NEGATIVE  09/28/2016 1700   LEUKOCYTESUR NEGATIVE 09/28/2016 1700    SIGNIFICANT DIAGNOSTIC STUDIES CT Code Stroke CTA Head W/WO contrast  Result Date: 02/16/2020 CLINICAL DATA:  Right facial droop EXAM: CT ANGIOGRAPHY HEAD AND NECK TECHNIQUE: Multidetector CT imaging of the head and neck was performed using the standard protocol during bolus administration of intravenous contrast. Multiplanar CT image reconstructions and MIPs were obtained to evaluate the vascular anatomy. Carotid stenosis measurements (when applicable) are obtained utilizing NASCET criteria, using the distal internal carotid diameter as the denominator. CONTRAST:  75  mL Omnipaque 350 COMPARISON:  None. FINDINGS: CTA NECK Aortic arch: Great vessel origins are patent. Right carotid system: Patent. Minimal calcified plaque at the ICA origin without measurable stenosis. Mild tortuosity of the distal cervical ICA. Left carotid system: Patent. Trace calcified plaque at the ICA origin without measurable stenosis. Mild tortuosity of the distal cervical ICA. Vertebral arteries: Patent.  No measurable stenosis. Skeleton: Cervical spine degenerative changes. Other neck: No mass or adenopathy. Upper chest: No apical lung mass. Dilatation of the main pulmonary artery reflecting pulmonary arterial hypertension. Review of the MIP images confirms the above findings CTA HEAD Anterior circulation: Intracranial internal carotid arteries are patent with mild atherosclerotic irregularity. Anterior and middle cerebral arteries are patent. Posterior circulation: Intracranial vertebral arteries, basilar artery, and posterior cerebral arteries are patent. PICA, AICA, and superior cerebellar artery origins are patent. There are patent bilateral posterior communicating arteries. Venous sinuses: Patent as allowed by contrast bolus timing. Review of the MIP images confirms the above findings IMPRESSION: No large vessel occlusion or hemodynamically significant stenosis.  Electronically Signed   By: Guadlupe Spanish M.D.   On: 02/16/2020 22:02   CT HEAD WO CONTRAST  Result Date: 02/17/2020 CLINICAL DATA:  79 year old female code stroke presentation yesterday, right facial droop. Small age indeterminate left cerebellar SCA infarct on plain head CT. CTA negative for large vessel occlusion or stenosis. EXAM: CT HEAD WITHOUT CONTRAST TECHNIQUE: Contiguous axial images were obtained from the base of the skull through the vertex without intravenous contrast. COMPARISON:  CT head, CTA head and neck yesterday, and earlier. 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 No other intracranial mass effect. No midline shift. No ventriculomegaly. Stable gray-white matter differentiation throughout the brain. Small left SCA territory infarct appears unchanged. Largely normal for age gray-white differentiation elsewhere. No cortically based acute infarct identified. No acute intracranial hemorrhage identified. Vascular: Mild Calcified atherosclerosis at the skull base. Skull: No acute osseous abnormality identified. Hyperostosis of the calvarium. Sinuses/Orbits: Visualized paranasal sinuses and mastoids are stable and well pneumatized. Other: No acute orbit or scalp soft tissue findings. IMPRESSION: 1. Stable since yesterday. No cortically based infarct or acute intracranial hemorrhage identified. 2. Small left SCA territory infarct, new since 2018 but favor chronic. Electronically Signed   By: [name] M.D.   On: 02/17/2020 12:45   CT Code Stroke CTA Neck W/WO contrast  Result Date: 02/16/2020 CLINICAL DATA:  Right facial droop EXAM: CT ANGIOGRAPHY HEAD AND NECK TECHNIQUE: Multidetector CT imaging of the head and neck was performed using the standard protocol during bolus administration of intravenous contrast. Multiplanar CT image reconstructions and MIPs were obtained to evaluate the vascular anatomy. Carotid stenosis measurements (when applicable) are  obtained utilizing NASCET criteria, using the distal internal carotid diameter as the denominator. CONTRAST:  75 mL Omnipaque 350 COMPARISON:  None. FINDINGS: CTA NECK Aortic arch: Great vessel origins are patent. Right carotid system: Patent. Minimal calcified plaque at the ICA origin without measurable stenosis. Mild tortuosity of the distal cervical ICA. Left carotid system: Patent. Trace calcified plaque at the ICA origin without measurable stenosis. Mild tortuosity of the distal cervical ICA. Vertebral arteries: Patent.  No measurable stenosis. Skeleton: Cervical spine degenerative changes. Other neck: No mass or adenopathy. Upper chest: No apical lung mass. Dilatation of the main pulmonary artery reflecting pulmonary arterial hypertension. Review of the MIP images confirms the above findings CTA HEAD Anterior circulation: Intracranial internal carotid arteries are patent with mild atherosclerotic irregularity. Anterior and middle  cerebral arteries are patent. Posterior circulation: Intracranial vertebral arteries, basilar artery, and posterior cerebral arteries are patent. PICA, AICA, and superior cerebellar artery origins are patent. There are patent bilateral posterior communicating arteries. Venous sinuses: Patent as allowed by contrast bolus timing. Review of the MIP images confirms the above findings IMPRESSION: No large vessel occlusion or hemodynamically significant stenosis. Electronically Signed   By: Guadlupe Spanish M.D.   On: 02/16/2020 22:02   MR BRAIN WO CONTRAST  Result Date: 02/17/2020 CLINICAL DATA:  Sudden onset of slurred speech and double vision EXAM: MRI HEAD WITHOUT CONTRAST TECHNIQUE: Multiplanar, multiecho pulse sequences of the brain and surrounding structures were obtained without intravenous contrast. COMPARISON:  02/02/2014 brain MRI FINDINGS: Brain: There is an acute infarct of the pons, left-greater-than-right. There is also punctate focus of acute ischemia in the left  cerebellum. No acute ischemia of the midbrain or above. There is multifocal periventricular white matter hyperintensity, most often a result of chronic microvascular ischemia. Normal CSF volume. Small amount of hemosiderin at the inferior pons. Foci of chronic microhemorrhage in the left parietal and occipital lobes. Vascular: Normal flow voids. Skull and upper cervical spine: Normal marrow signal. Sinuses/Orbits: Negative. Other: None. IMPRESSION: 1. Acute bilateral pontine infarcts, left-greater-than-right. 2. Punctate focus of acute ischemia in the left cerebellum. 3. Findings of chronic microvascular ischemia. Electronically Signed   By: Deatra Robinson M.D.   On: 02/17/2020 22:45   CT HEAD CODE STROKE WO CONTRAST  Result Date: 02/16/2020 CLINICAL DATA:  Code stroke.  Right facial droop EXAM: CT HEAD WITHOUT CONTRAST TECHNIQUE: Contiguous axial images were obtained from the base of the skull through the vertex without intravenous contrast. COMPARISON:  2018 FINDINGS: Brain: There is no acute intracranial hemorrhage, mass effect, or edema. No new loss of gray-white differentiation. There is an age-indeterminate small infarct of the superior left cerebellum. Ventricles and sulci are normal in size and configuration. Patchy hypoattenuation in the supratentorial white matter is nonspecific but may reflect mild chronic microvascular ischemic changes. There is no extra-axial fluid collection Vascular: There is no hyperdense vessel. Mild intracranial atherosclerotic calcification at the skull base. Skull: Unremarkable. Sinuses/Orbits: No acute abnormality. Other: Mastoid air cells are clear. ASPECTS ([place] Stroke Program Early CT Score) - Ganglionic level infarction (caudate, lentiform nuclei, internal capsule, insula, M1-M3 cortex): 7 - Supraganglionic infarction (M4-M6 cortex): 3 Total score (0-10 with 10 being normal): 10 IMPRESSION: There is no acute intracranial hemorrhage or evidence of definite acute  infarction. ASPECT score is 10. New age-indeterminate small infarct of the superior left cerebellum. These results were communicated to Dr. Laurence Slate at 9:50 pmon 7/7/2021by text page via the [facility] messaging system. Electronically Signed   By: Guadlupe Spanish M.D.   On: 02/16/2020 21:57   VAS [place] LOWER EXTREMITY VENOUS (DVT)  Result Date: 02/21/2020  Lower Venous DVT Study Indications: Stroke.  Limitations: Postioning secondary to stroke. Comparison Study: Prior study from 09/30/16 is available for comparison Performing Technologist: Sherren Kerns RVS  Examination Guidelines: A complete evaluation includes B-mode imaging, spectral Doppler, color Doppler, and power Doppler as needed of all accessible portions of each vessel. Bilateral testing is considered an integral part of a complete examination. Limited examinations for reoccurring indications may be performed as noted. The reflux portion of the exam is performed with the patient in reverse Trendelenburg.  +---------+---------------+---------+-----------+----------+--------------+ RIGHT    CompressibilityPhasicitySpontaneityPropertiesThrombus Aging +---------+---------------+---------+-----------+----------+--------------+ CFV      Full           Yes  Yes                                 +---------+---------------+---------+-----------+----------+--------------+ SFJ      Full                                                        +---------+---------------+---------+-----------+----------+--------------+ FV Prox  Full                                                        +---------+---------------+---------+-----------+----------+--------------+ FV Mid   Full                                                        +---------+---------------+---------+-----------+----------+--------------+ FV DistalFull                                                         +---------+---------------+---------+-----------+----------+--------------+ PFV      Full                                                        +---------+---------------+---------+-----------+----------+--------------+ POP      Full           Yes      Yes                                 +---------+---------------+---------+-----------+----------+--------------+ PTV      Full                                                        +---------+---------------+---------+-----------+----------+--------------+ PERO     Full                                                        +---------+---------------+---------+-----------+----------+--------------+   +---------+---------------+---------+-----------+----------+--------------+ LEFT     CompressibilityPhasicitySpontaneityPropertiesThrombus Aging +---------+---------------+---------+-----------+----------+--------------+ CFV      Full           Yes      Yes                                 +---------+---------------+---------+-----------+----------+--------------+ SFJ      Full                                                        +---------+---------------+---------+-----------+----------+--------------+  FV Prox  Full                                                        +---------+---------------+---------+-----------+----------+--------------+ FV Mid   Full                                                        +---------+---------------+---------+-----------+----------+--------------+ FV DistalFull                                                        +---------+---------------+---------+-----------+----------+--------------+ PFV      Full                                                        +---------+---------------+---------+-----------+----------+--------------+ POP      Full           Yes      Yes                                  +---------+---------------+---------+-----------+----------+--------------+ PTV      Full                                                        +---------+---------------+---------+-----------+----------+--------------+ PERO     Full                                                        +---------+---------------+---------+-----------+----------+--------------+   Left Technical Findings: Not visualized segments include portions of the posterior tibial and peroneal veins.   Summary: RIGHT: - Findings appear essentially unchanged compared to previous examination. - There is no evidence of deep vein thrombosis in the lower extremity.  LEFT: - Findings appear essentially unchanged compared to previous examination. - There is no evidence of deep vein thrombosis in the lower extremity. However, portions of this examination were limited- see technologist comments above.  *See table(s) above for measurements and observations. Electronically signed by Coral Else MD on 02/21/2020 at 6:32:48 AM.    Final        HISTORY OF PRESENT ILLNESS INES REBEL is a 79 y.o. female with past medical history significant for hypertension, hyperlipidemia, DVT, TIA, PE, Takotsubo cardiomyopathy, obstructive sleep apnea presents to emergency department as a code stroke for sudden onset slurred speech, double vision.  History obtained by the patient and her son over the phone.  She was last known well around 6:30 PM on 02/16/2020 when  she spoke with the son and somewhere between 7 and 8 PM patient's speech became significantly slurred.  EMS was called and they noted patient had forced gaze deviation to the right, left facial droop and brought in as a code stroke.  Blood pressure was 140 systolic.  Initial NIH stroke scale was 13 (patient had difficulty raising both legs), stat CT head was obtained which showed no hemorrhage.  Aspects 10 out of 10.  CT angiogram was obtained to rule out large vessel occlusion, with  anterior circulation as well as PCA, vertebrals and basilar were open.  Baseline MRS 0. Patient received IV TPA after discussing risk versus benefit.  She subsequently worsened after IV TPA with worsening right-sided weakness, improved after laying head of bed flat and giving IV fluids.  Denied headache at the time.   HOSPITAL COURSE Ms. MCKENLEE MANGHAM is a 79 y.o. female with history of hypertension, hyperlipidemia, DVT, TIA, PE, Takotsubo cardiomyopathy, obstructive sleep apnea presenting with Left facial droop, double vision, slurred speech, right side weakness. Received tPA 02/16/2020 at 2142. Worsened post tPA w/ worsened R hemiparesis which improved w/ laying flat and fluids.   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 Age indeterminate L cerebellar infarct.      CTA head & neck no LVO or significant stenosis   CT head repeat 2/2 increased lethargy - stable. No acute infarct. likely old small SCA territory infarct new since 2018  MRI  L>R pontine infarcts. Punctate L cerebellar infarct.  2D Echo 12/2019 - EF 55-60%. No source of embolus. LA dilated.   LE venous doppler - negative for DVT bilaterally  LDL 70  HgbA1c 5.5  Loop recorder placed 7/12 to rule out AF  VTE prophylaxis - Lovenox 40 mg sq daily    aspirin 81 mg daily prior to admission, now on aspirin to 81 and plavix. Continue DAPT x 3 weeks then plavix alone   Therapy recommendations:  CIR  Disposition:  CIR  Post tPA lower lip swelling  Immediately occurring after tPA  Treated w/ pepcid, Benadryl and solumedrol  Added to allergy profile  Left eye pain, improving  Due to left peripheral CN VII palsy  Watery eyes due to difficulty with eye closure  Artificial tears Q3h while awake  Artifical tears ointment Qhs   Hypertension  Home meds:  Lasix 40 prn, amlodipine 10, HCTZ 25  Stable on the low end  SBP goal  < 180  Now on lisinopril 20 -> 10  D/c norvasc and HCTZ due to relative low BP  Long-term BP goal normotensive  Hyperlipidemia  Home meds:  liptior 20, resumed in hospital  LDL 70, goal < 70  Will hold on more intensive statin given LDL at goal  Continue statin at discharge  Other Stroke Risk Factors  Advanced age  Former Cigarette smoker  Obesity, Body mass index is 35.56 kg/m., recommend weight loss, diet and exercise as appropriate   Obstructive sleep apnea, on CPAP at home  Takotsubo cardiomyopathy  Hx DVT   Other Active Problems  Baseline dementia on aricept and namenda, memory probs since 2013  Leukocytosis, WBC 11.6->9.3->8.3->9.4  Bradycardia - 40's - 50's  AKI, Cre 0.86->0.54->1.21   DISCHARGE EXAM Blood pressure (!) 112/51, pulse (!) 43, temperature 98.1 F (36.7 C), temperature source Oral, resp. rate 16, weight 88.2 kg, SpO2 96 %. General - Well nourished, well developed,  in no apparent distress.  Ophthalmologic - fundi not visualized due to noncooperation.  Cardiovascular - Regular rhythm and rate.  Mental Status -  Initially sleeping but easily arousable and orientation to time, place, and person were intact. Language including expression, naming, repetition, comprehension was assessed and found intact. However, mild to moderate dysarthria  Cranial Nerves II - XII - II - Visual field intact OU. III, IV, VI - Extraocular movements showed left eye only limited movement upward and downward, right eye left gaze palsy.  V - Facial sensation intact bilaterally. VII - upper and lower facial weakness on the left VIII - Hearing & vestibular intact bilaterally. X - Palate elevates symmetrically. XI - Chin turning & shoulder shrug intact bilaterally. XII - Tongue protrusion intact.  Motor Strength - The patient's strength was symmetrical in BLEs, 4/5 LUE however, 3+/5 RUE proximal, bicep and tricep, finger grip 4/5. Bulk was normal and  fasciculations were absent.   Motor Tone - Muscle tone was assessed at the neck and appendages and was normal.  Reflexes - The patient's reflexes were symmetrical in all extremities and she had no pathological reflexes.  Sensory - Light touch, temperature/pinprick were assessed and were symmetrical.    Coordination - The patient had normal movements in the left hand with no ataxia or dysmetria. However, right UE FTN ataxia, still proportional to the weakness. Tremor was absent.  Gait and Station - deferred.  Discharge Diet  Dysphagia 2 thin liquids  DISCHARGE PLAN  Disposition:  Transfer to [facility] for ongoing PT, OT and ST  aspirin 81 mg daily and clopidogrel 75 mg daily for secondary stroke prevention for 3 weeks then plavic alone.  Recommend ongoing stroke risk factor control by Primary Care Physician at time of discharge from inpatient rehabilitation.  Follow-up PCP [name], MD in 2 weeks following discharge from rehab.  Follow-up in [facility] in 4 weeks following discharge from rehab, office to schedule an appointment.  Follow-up [facility] [facility] for implantable loop recorder wound check in 10-14 days, office to schedule an appointment.  Loop recorder to be monitored by [facility] for atrial fibrillation as source of stroke. If found, they will notify patient.  35 minutes were spent preparing discharge.  Marvel Plan, MD PhD Stroke Neurology 02/21/2020 3:59 PM

## 2020-02-21 NOTE — Progress Notes (Addendum)
Inpatient Rehabilitation Medication Review by a Pharmacist  A complete drug regimen review was completed for this patient to identify any potential clinically significant medication issues.  Clinically significant medication issues were identified:  yes   Type of Medication Issue Identified Description of Issue Urgent (address now) Non-Urgent (address on AM team rounds) Plan Plan Accepted by Provider? (Yes / No / Pending AM Rounds)  Drug Interaction(s) (clinically significant)       Duplicate Therapy       Allergy       No Medication Administration End Date       Incorrect Dose  PTA med rec states patient was taking Buspar 15mg  BID PTA (although did receive Buspar 30mg  bid throughout his inpatient admission), reordered as Buspar 30mg  BID at rehab Non-urgent Sent secure chats to MD/PharmD for tomorrow   Additional Drug Therapy Needed       Other  Rehab MD's note stated to reduce lisinopril to 5mg  daily today but still ordered at 10mg  daily Non-urgent Sent secure chats to MD/PharmD for tomorrow     For non-urgent medication issues to be resolved on team rounds tomorrow morning a [facility] Secure Chat Handoff was sent to: (MD) and (PharmD)   Pharmacist comments:   Time spent performing this drug regimen review (minutes):  15 minutes   [place] 02/21/2020 5:18 PM   ADDENDUM - Dr. responded to secure message - she will decrease lisinopril dose and confirmed ok to continue higher buspar dose.   [place], PharmD, BCPS Please check AMION for all [facility] contact numbers Clinical Pharmacist 02/21/2020 9:00 PM

## 2020-02-21 NOTE — H&P (Addendum)
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  Per chart review patient lives with her granddaughter.  1 level home 6 steps to entry.  Her granddaughter works at night however patient states that she has a son in the area as well as a daughter-in-law that works different times.  Patient ambulates with a rolling walker for household distances prior to admission.  Granddaughter does assist with some bathing and ADLs.  Presented 02/16/2020 with left facial droop dysarthria and visual changes.  MRI of the brain showed left greater than right brainstem infarction.  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  Punctate focus of acute ischemia in the left cerebellum.  CT angiogram of head and neck with no large vessel occlusion.  Patient did receive TPA.  Echocardiogram from 12/30/2019 showed ejection fraction 55 to 60% and no source of embolus.  Admission chemistries unremarkable.  EKG normal sinus rhythm.  Maintain on aspirin 81 mg daily and Plavix  daily for CVA prophylaxis x3 weeks then Plavix alone.  Subcutaneous Lovenox for DVT prophylaxis.  Venous Dopplers lower extremities negative.  Plan is for loop recorder placement 02/21/2020.  She is on a dysphagia #2 thin liquid diet.  Patient was admitted for a comprehensive rehab program.  Review of Systems  Constitutional: Positive for malaise/fatigue. Negative for chills and fever.  HENT: Negative for hearing loss.   Eyes: Positive for blurred vision.  Respiratory: Negative for cough and shortness of breath.   Cardiovascular: Positive for leg  swelling. Negative for chest pain and palpitations.  Gastrointestinal: Positive for constipation. Negative for heartburn, nausea and vomiting.  Genitourinary: Negative for dysuria, flank pain and hematuria.  Musculoskeletal: Positive for myalgias.  Skin: Negative for rash.  Neurological: Positive for speech change and weakness.  Psychiatric/Behavioral: Positive for depression and memory loss. The patient has insomnia.   All other systems reviewed and are negative.  Past Medical History:  Diagnosis Date  . Arthritis   . Asthma   . Chronic back pain   . Depression   . History of pulmonary embolus (PE)   . Hx-TIA (transient ischemic attack)   . Hyperlipemia   . Hypertension   . Personal history of DVT (deep vein thrombosis)   . Scoliosis   . Sleep apnea    cpap - settingsi at 3   . 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 ARTHROSCOPIC REPAIR ACL  02/03/2004  . LEFT HEART CATH AND CORONARY ANGIOGRAPHY N/A 09/16/2019   Procedure: LEFT HEART CATH AND CORONARY ANGIOGRAPHY;  Surgeon: Yvonne Kendall, MD;  Location: [facility] INVASIVE CV LAB;  Service: Cardiovascular;  Laterality: N/A;  . OOPHORECTOMY  1970  . SPINE SURGERY  03/31/2006  . TOTAL KNEE ARTHROPLASTY Left 05/17/2016   Procedure: LEFT TOTAL KNEE ARTHROPLASTY;  Surgeon: Kathryne Hitch, MD;  Location: [facility] ORS;  Service: Orthopedics;  Laterality: Left;  Adductor Block; Spinal to General  . TUBAL LIGATION  1983   Family History  Problem Relation Age of Onset  . Other Father        kidney problems   Social History:  reports that  she quit smoking about 46 years ago. Her smoking use included cigarettes. She has a 10.00 pack-year smoking history. She has never used smokeless tobacco. She reports that she does not drink alcohol and does not use drugs. Allergies:  Allergies  Allergen Reactions  . T-Pa [Alteplase] Swelling    Lip swelling immediately post tPA  . Codeine Itching    Medications Prior to Admission  Medication Sig Dispense Refill  . acetaminophen (TYLENOL) 160 MG/5ML solution Place 20.3 mLs (650 mg total) into feeding tube every 4 (four) hours as needed for mild pain (or temp > 37.5 C (99.5 F)). 120 mL 0  . acetaminophen (TYLENOL) 325 MG tablet Take 2 tablets (650 mg total) by mouth every 4 (four) hours as needed for mild pain (or temp > 37.5 C (99.5 F)).    Marland Kitchen artificial tears (LACRILUBE) OINT ophthalmic ointment Place into the left eye at bedtime.    Marland Kitchen aspirin EC 81 MG EC tablet Take 1 tablet (81 mg total) by mouth daily. 90 tablet 3  . atorvastatin (LIPITOR) 20 MG tablet Take 20 mg by mouth at bedtime.     . busPIRone (BUSPAR) 30 MG tablet Take 0.5 tablets (15 mg total) by mouth in the morning and at bedtime.    . cholecalciferol (VITAMIN D3) 25 MCG (1000 UT) tablet Take 1,000 Units by mouth daily.    Melene Muller ON 02/22/2020] clopidogrel (PLAVIX) 75 MG tablet Take 1 tablet (75 mg total) by mouth daily.    . cycloSPORINE (RESTASIS) 0.05 % ophthalmic emulsion Place 1 drop into both eyes daily as needed (dry eyes). 0.4 mL   . donepezil (ARICEPT) 10 MG tablet Take 10 mg by mouth at bedtime.     Melene Muller ON 02/22/2020] enoxaparin (LOVENOX) 40 MG/0.4ML injection Inject 0.4 mLs (40 mg total) into the skin daily. 0 mL   . [START ON 02/22/2020] lisinopril (ZESTRIL) 10 MG tablet Take 1 tablet (10 mg total) by mouth daily.    . melatonin 3 MG TABS tablet Take 1 tablet (3 mg total) by mouth at bedtime.  0  . memantine (NAMENDA XR) 28 MG CP24 24 hr capsule Take 28 mg by mouth daily.   0  . mirtazapine (REMERON) 7.5 MG tablet Take 1 tablet (7.5 mg total) by mouth as needed (at bedtime).    . Multiple Vitamins-Minerals (MULTI-VITAMIN GUMMIES PO) Take 1 each by mouth daily.     . nitroGLYCERIN (NITROSTAT) 0.4 MG SL tablet Place 1 tablet (0.4 mg total) under the tongue every 5 (five) minutes as needed for chest pain. 25 tablet 2  . oxyCODONE (ROXICODONE) 15 MG immediate  release tablet Take 1 tablet (15 mg total) by mouth 4 (four) times daily as needed (chronic pain). 30 tablet 0  . polyvinyl alcohol (LIQUIFILM TEARS) 1.4 % ophthalmic solution Place 2 drops into the left eye 4 (four) times daily. 15 mL 0  . senna-docusate (SENOKOT-S) 8.6-50 MG tablet Take 1 tablet by mouth at bedtime as needed for mild constipation.      Drug Regimen Review Drug regimen was reviewed and remains appropriate with no significant issues identified  Home: Home Living Family/patient expects to be discharged to:: Private residence Living Arrangements: Other relatives (gdtr) Available Help at Discharge: Family, Available PRN/intermittently (gdtr works nights per pt) Type of Home: House Home Access: Stairs to enter Secretary/administrator of Steps: 6 Entrance Stairs-Rails: Right, Left Home Layout: One level Bathroom Shower/Tub: Engineer, manufacturing systems: Standard Home Equipment: Environmental consultant - 2  wheels, Wheelchair - manual Additional Comments: Pt reports she lives with her grand daughter who works at night    Functional History: Prior Function Level of Independence: Needs Technical sales engineer / Transfers Assistance Needed: pt reports ambulating with a RW for household distances ADL's / Homemaking Assistance Needed: Pt reports grand daughter assists her with bathing, and all IADLs   Functional Status:  Mobility: Bed Mobility Overal bed mobility: Needs Assistance Bed Mobility: Supine to Sit Supine to sit: Min guard, HOB elevated Transfers Overall transfer level: Needs assistance Equipment used: Rolling walker (2 wheeled), 1 person hand held assist Transfers: Sit to/from Stand, [employer] Transfers Sit to Stand: Mod assist Stand pivot transfers: Mod assist General transfer comment: pt performs 2 stand pivot transfers with PT assist, 3 sit to stands with use of RW Ambulation/Gait Ambulation/Gait assistance: Mod assist Gait Distance (Feet): 1 Feet Assistive device: Rolling  walker (2 wheeled) Gait Pattern/deviations: Step-to pattern General Gait Details: pt with short steps, taking one step forward with each foot, otherwise working on pre-gait marching and weight shifting Gait velocity: reduced Gait velocity interpretation: <1.31 ft/sec, indicative of household ambulator  ADL: ADL Overall ADL's : Needs assistance/impaired Eating/Feeding: Minimal assistance, Sitting Grooming: Wash/dry hands, Wash/dry face, Oral care, Brushing hair, Moderate assistance, Sitting Upper Body Bathing: Moderate assistance, Sitting Lower Body Bathing: Maximal assistance, Sit to/from stand Upper Body Dressing : Maximal assistance, Sitting Lower Body Dressing: Total assistance, Sit to/from stand Toilet Transfer: Total assistance Toilet Transfer Details (indicate cue type and reason): unable to safely attempt with +1 assist  Toileting- Clothing Manipulation and Hygiene: Total assistance, Sit to/from stand Functional mobility during ADLs: Total assistance General ADL Comments: Pt fatigued.  Heavy rt lateral lean   Cognition: Cognition Overall Cognitive Status: Impaired/Different from baseline Orientation Level: Oriented X4 Attention: Focused, Selective, Sustained Focused Attention: Appears intact Sustained Attention: Appears intact Selective Attention: Impaired Selective Attention Impairment: Verbal basic, Functional basic Memory: Impaired Memory Impairment: Retrieval deficit, Decreased short term memory Decreased Short Term Memory: Verbal basic Awareness: Appears intact Problem Solving: Impaired Problem Solving Impairment: Verbal basic, Functional basic Cognition Arousal/Alertness: Awake/alert Behavior During Therapy: WFL for tasks assessed/performed Overall Cognitive Status: Impaired/Different from baseline Area of Impairment: Attention, Memory, Safety/judgement, Awareness Current Attention Level: Sustained Memory: Decreased recall of precautions Following Commands:  Follows one step commands consistently Safety/Judgement: Decreased awareness of deficits Awareness: Emergent Problem Solving: Requires verbal cues  Physical Exam: There were no vitals taken for this visit. General: Alert and oriented x 3, No apparent distress HEENT: Head is normocephalic, atraumatic, left eye with limited movement, right eye with medial rectus palsy Neck: Supple without JVD or lymphadenopathy Heart: Reg rate and rhythm. No murmurs rubs or gallops Chest: CTA bilaterally without wheezes, rales, or rhonchi; no distress Abdomen: Soft, non-tender, non-distended, bowel sounds positive. Extremities: No clubbing, cyanosis, or edema. Pulses are 2+ Skin: Clean and intact without signs of breakdown Neuro: Patient is alert sitting up in bed.  Makes good eye contact with examiner.  Mild left facial droop.  Speech is dysarthric but intelligible.  She is oriented x3 and follows commands. 4+/5 bilateral LE strength RUE: 4/5 SA, EF, EE, 4/5 hand grip LUE: 4/5 throughout  RUE impaired coordination.  Sensation intact Psych: Pt's affect is appropriate. Pt is cooperative  Results for orders placed or performed during the hospital encounter of 02/16/20 (from the past 48 hour(s))  Basic metabolic panel     Status: None   Collection Time: 02/20/20  4:41 AM  Result Value Ref  Range   Sodium 140 135 - 145 mmol/L   Potassium 3.5 3.5 - 5.1 mmol/L   Chloride 104 98 - 111 mmol/L   CO2 24 22 - 32 mmol/L   Glucose, Bld 86 70 - 99 mg/dL    Comment: Glucose reference range applies only to samples taken after fasting for at least 8 hours.   BUN 11 8 - 23 mg/dL   Creatinine, Ser 3.01 0.44 - 1.00 mg/dL   Calcium 9.3 8.9 - 60.1 mg/dL   GFR calc non Af Amer >60 >60 mL/min   GFR calc Af Amer >60 >60 mL/min   Anion gap 12 5 - 15    Comment: Performed at [facility], [address], [place], [place] [address]  CBC     Status: Abnormal   Collection Time: 02/20/20  4:41 AM  Result Value Ref  Range   WBC 8.3 4.0 - 10.5 K/uL   RBC 3.78 (L) 3.87 - 5.11 MIL/uL   Hemoglobin 12.0 12.0 - 15.0 g/dL   HCT 55.7 36 - 46 %   MCV 98.4 80.0 - 100.0 fL   MCH 31.7 26.0 - 34.0 pg   MCHC 32.3 30.0 - 36.0 g/dL   RDW 32.2 02.5 - 42.7 %   Platelets 163 150 - 400 K/uL   nRBC 0.0 0.0 - 0.2 %    Comment: Performed at [facility], [address] [place], [place] [address]  Basic metabolic panel     Status: Abnormal   Collection Time: 02/21/20  4:42 AM  Result Value Ref Range   Sodium 140 135 - 145 mmol/L   Potassium 3.5 3.5 - 5.1 mmol/L   Chloride 104 98 - 111 mmol/L   CO2 24 22 - 32 mmol/L   Glucose, Bld 83 70 - 99 mg/dL    Comment: Glucose reference range applies only to samples taken after fasting for at least 8 hours.   BUN 21 8 - 23 mg/dL   Creatinine, Ser 6.28 (H) 0.44 - 1.00 mg/dL   Calcium 9.1 8.9 - 31.5 mg/dL   GFR calc non Af Amer 43 (L) >60 mL/min   GFR calc Af Amer 49 (L) >60 mL/min   Anion gap 12 5 - 15    Comment: Performed at [facility], [address] [place], [place] [address]  CBC     Status: Abnormal   Collection Time: 02/21/20  4:42 AM  Result Value Ref Range   WBC 9.4 4.0 - 10.5 K/uL   RBC 3.96 3.87 - 5.11 MIL/uL   Hemoglobin 12.7 12.0 - 15.0 g/dL   HCT 07.3 36 - 46 %   MCV 100.5 (H) 80.0 - 100.0 fL   MCH 32.1 26.0 - 34.0 pg   MCHC 31.9 30.0 - 36.0 g/dL   RDW 71.0 62.6 - 94.8 %   Platelets 152 150 - 400 K/uL   nRBC 0.0 0.0 - 0.2 %    Comment: Performed at [facility], [address], [place], [place] [address]   VAS [place] LOWER EXTREMITY VENOUS (DVT)  Result Date: 02/21/2020  Lower Venous DVT Study Indications: Stroke.  Limitations: Postioning secondary to stroke. Comparison Study: Prior study from 09/30/16 is available for comparison Performing Technologist: Sherren Kerns RVS  Examination Guidelines: A complete evaluation includes B-mode imaging, spectral Doppler, color Doppler, and power Doppler as needed of all accessible portions of each  vessel. Bilateral testing is considered an integral part of a complete  examination. Limited examinations for reoccurring indications may be performed as noted. The reflux portion of the exam is performed with the patient in reverse Trendelenburg.  +---------+---------------+---------+-----------+----------+--------------+ RIGHT    CompressibilityPhasicitySpontaneityPropertiesThrombus Aging +---------+---------------+---------+-----------+----------+--------------+ CFV      Full           Yes      Yes                                 +---------+---------------+---------+-----------+----------+--------------+ SFJ      Full                                                        +---------+---------------+---------+-----------+----------+--------------+ FV Prox  Full                                                        +---------+---------------+---------+-----------+----------+--------------+ FV Mid   Full                                                        +---------+---------------+---------+-----------+----------+--------------+ FV DistalFull                                                        +---------+---------------+---------+-----------+----------+--------------+ PFV      Full                                                        +---------+---------------+---------+-----------+----------+--------------+ POP      Full           Yes      Yes                                 +---------+---------------+---------+-----------+----------+--------------+ PTV      Full                                                        +---------+---------------+---------+-----------+----------+--------------+ PERO     Full                                                        +---------+---------------+---------+-----------+----------+--------------+   +---------+---------------+---------+-----------+----------+--------------+ LEFT      CompressibilityPhasicitySpontaneityPropertiesThrombus Aging +---------+---------------+---------+-----------+----------+--------------+ CFV      Full  Yes      Yes                                 +---------+---------------+---------+-----------+----------+--------------+ SFJ      Full                                                        +---------+---------------+---------+-----------+----------+--------------+ FV Prox  Full                                                        +---------+---------------+---------+-----------+----------+--------------+ FV Mid   Full                                                        +---------+---------------+---------+-----------+----------+--------------+ FV DistalFull                                                        +---------+---------------+---------+-----------+----------+--------------+ PFV      Full                                                        +---------+---------------+---------+-----------+----------+--------------+ POP      Full           Yes      Yes                                 +---------+---------------+---------+-----------+----------+--------------+ PTV      Full                                                        +---------+---------------+---------+-----------+----------+--------------+ PERO     Full                                                        +---------+---------------+---------+-----------+----------+--------------+   Left Technical Findings: Not visualized segments include portions of the posterior tibial and peroneal veins.   Summary: RIGHT: - Findings appear essentially unchanged compared to previous examination. - There is no evidence of deep vein thrombosis in the lower extremity.  LEFT: - Findings appear essentially unchanged compared to previous examination. - There is no evidence of deep vein thrombosis in the lower extremity. However,  portions of this examination  were limited- see technologist comments above.  *See table(s) above for measurements and observations. Electronically signed by Coral Else MD on 02/21/2020 at 6:32:48 AM.    Final        Medical Problem List and Plan: 1.  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  Status post TPA.  Status post loop recorder placement  -patient may shower but loop recorder must be covered.   -ELOS/Goals: min assist  with PT, min assist with OT, independent with SLP 20-24 days 2.  Antithrombotics: -DVT/anticoagulation: Lovenox.  Vascular study negative  -antiplatelet therapy: Aspirin 81 mg daily and Plavix 85 mg daily x3 weeks then Plavix alone 3. Pain Management: Oxycodone 15 mg every 4 hours as needed chronic pain 4. Mood: Melatonin 3 mg nightly, Remeron 7.5 mg nightly as needed,  BuSpar 30 mg twice daily  -antipsychotic agents: N/A 5. Neuropsych: This patient is capable of making decisions on her own behalf. 6. Skin/Wound Care: Routine skin checks 7. Fluids/Electrolytes/Nutrition: Routine in and outs with follow-up chemistries. Reordered daily multivitamin she takes at home 8.  Hypertension.  Lisinopril 10 mg daily.  Monitor with increased mobility  7/12: BP and pulse have been low. Decrease Lisinopril to 5mg . Was also on Amlodipine 10mg  daily at home.  9.  Hyperlipidemia.  Lipitor 10.  Obesity.  BMI 35.56.  Follow-up dietary 11.  OSA.  Patient on CPAP at home. 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  Follow-up outpatient cardiology service 13. Dementia: Namenda 28 mg daily, Aricept 10 mg nightly.  Mcarthur Rossetti Angiulli, PA-C 02/21/2020   I have personally performed a face to face diagnostic evaluation, including, but not limited to relevant history and physical exam findings, of this patient and developed relevant assessment and plan.  Additionally, I have reviewed and concur with the physician  assistant's documentation above.  The patient's status has not changed. The original post admission physician evaluation remains appropriate, and any changes from the pre-admission screening or documentation from the acute chart are noted above.   Sula Soda, MD

## 2020-02-21 NOTE — IPOC Note (Signed)
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)     Functional Problem List: Nursing Bladder, Bowel, Motor, Pain  PT Balance, Safety, Behavior, Endurance, Motor, Pain  OT Balance, Behavior, Cognition, Endurance, Motor, Pain, Perception, Safety, Vision, Sensory  SLP Cognition  TR         Basic ADL's: OT Grooming, Bathing, Dressing, Toileting, Eating     Advanced  ADL's: OT       Transfers: PT Bed Mobility, Bed to Chair, Car, Occupational psychologist, Research scientist (life sciences): PT Ambulation     Additional Impairments: OT None  SLP Swallowing, Social Cognition   Awareness, Attention, Memory, Problem Solving  TR      Anticipated Outcomes Item Anticipated Outcome  Self Feeding Supervision  Swallowing  Mod I   Basic self-care  MinA  Toileting  MinA   Bathroom Transfers MinA  Bowel/Bladder  Patient is incontinent of bladder, unsure of bowel as no BM but its the hope the patient will gain continence back  Transfers  min A  Locomotion  min A with LRAD for 50'  Communication     Cognition  Mod I  Pain  Pain less than 4  Safety/Judgment  Patient will safely attend PT/OT sessions and remain free of falls throughout stay   Therapy Plan: PT Intensity: Minimum of 1-2 x/day ,45 to 90 minutes PT Frequency: 5 out of 7 days PT Duration Estimated Length of Stay: 3 weeks OT Intensity: Minimum of 1-2 x/day, 45 to 90 minutes OT Frequency: Total of 15 hours over 7 days of combined therapies OT Duration/Estimated Length of Stay: 20-24 SLP Intensity: Minumum of 1-2 x/day, 30 to 90 minutes SLP Frequency: 3 to 5 out of 7  days SLP Duration/Estimated Length of Stay: 2.5-3 weeks    Team Interventions: Nursing Interventions Patient/Family Education, Bladder Management, Bowel Management, Pain Management, Cognitive Remediation/Compensation  PT interventions Ambulation/gait training, Community reintegration, DME/adaptive equipment instruction, Neuromuscular re-education, Museum/gallery curator, Psychosocial support, Wheelchair propulsion/positioning, UE/LE Strength taining/ROM, Warden/ranger, Discharge planning, Pain management, Skin care/wound management, Therapeutic Activities, UE/LE Coordination activities, Cognitive remediation/compensation, Disease management/prevention, Functional mobility training, Patient/family education, Splinting/orthotics, Therapeutic Exercise, Visual/perceptual remediation/compensation  OT Interventions Warden/ranger, Cognitive remediation/compensation, Community reintegration, Discharge planning, Disease mangement/prevention, DME/adaptive equipment instruction, Functional mobility training, Patient/family education, Pain management, Psychosocial support, Self Care/advanced ADL retraining, Therapeutic Activities, Visual/perceptual remediation/compensation, UE/LE Coordination activities, UE/LE Strength taining/ROM, Therapeutic Exercise, Wheelchair propulsion/positioning, Neuromuscular re-education  SLP Interventions Cognitive remediation/compensation, Cueing hierarchy, Dysphagia/aspiration precaution training, Functional tasks, Internal/external aids, Patient/family education  TR Interventions    SW/CM Interventions     Barriers to Discharge MD  Medical stability  Nursing      PT Behavior, Other (comments) new decreased cognition with impaired safety awareness and initiation; new onset of functional impairments  OT Inaccessible home environment bathroom only accessible by walker  SLP      SW       Team Discharge Planning: Destination: PT-Home ,OT- Home ,  SLP-Home Projected Follow-up: PT-Home health PT, OT-  Home health OT, 24 hour supervision/assistance, SLP-None Projected Equipment Needs: PT-To be determined, OT- To be determined, SLP-None recommended by SLP Equipment Details: PT-pt reports having SPC, manual WC, BSC, and shower  chair. will determine additional needs, OT-  Patient/family involved in discharge planning: PT- Patient,  OT-Patient, SLP-Patient  MD ELOS: 17-20 days. Medical Rehab Prognosis:  Good Assessment: 79 year old right-handed female with history of hypertension, hyperlipidemia, mild dementia maintained on Aricept as well as Namenda, chronic back pain maintained on oxycodone 15 mg every 4 hours as needed, DVT, Takotsubo cardiomyopathy, 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  Presented 02/16/2020 with left facial droop dysarthria and visual changes.  MRI of the brain showed left greater than right brainstem infarction.  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  Punctate focus of acute ischemia in the left cerebellum.  CT angiogram of head and neck with no large vessel occlusion.  Patient did receive TPA.  Echocardiogram from 12/30/2019 showed ejection fraction 55 to 60% and no source of embolus.  Admission chemistries unremarkable.  EKG normal sinus rhythm.  Maintain on aspirin 81 mg daily and Plavix  daily for CVA prophylaxis x3 weeks then Plavix alone. Loop recorder palced 02/21/2020.  She is on a dysphagia #2 thin liquid diet.  Patient with resulting functional deficits with mobility, transfers, swallowing, self-care.  Will set goals for Min A with PT/OT and Supervision/Mod I with SLP..  Due to the current state of emergency, patients may not be receiving their 3-hours of Medicare-mandated therapy.  See Team Conference Notes for weekly updates to the plan of care

## 2020-02-22 ENCOUNTER — Inpatient Hospital Stay (HOSPITAL_COMMUNITY): Payer: Medicare Other

## 2020-02-22 ENCOUNTER — Inpatient Hospital Stay (HOSPITAL_COMMUNITY): Payer: Medicare Other | Admitting: Occupational Therapy

## 2020-02-22 ENCOUNTER — Encounter (HOSPITAL_COMMUNITY): Payer: Self-pay | Admitting: Internal Medicine

## 2020-02-22 DIAGNOSIS — I6312 Cerebral infarction due to embolism of basilar artery: Secondary | ICD-10-CM

## 2020-02-22 DIAGNOSIS — I6350 Cerebral infarction due to unspecified occlusion or stenosis of unspecified cerebral artery: Secondary | ICD-10-CM

## 2020-02-22 DIAGNOSIS — E8809 Other disorders of plasma-protein metabolism, not elsewhere classified: Secondary | ICD-10-CM

## 2020-02-22 DIAGNOSIS — E46 Unspecified protein-calorie malnutrition: Secondary | ICD-10-CM

## 2020-02-22 DIAGNOSIS — I10 Essential (primary) hypertension: Secondary | ICD-10-CM

## 2020-02-22 DIAGNOSIS — N179 Acute kidney failure, unspecified: Secondary | ICD-10-CM

## 2020-02-22 LAB — CBC WITH DIFFERENTIAL/PLATELET
Abs Immature Granulocytes: 0.02 10*3/uL (ref 0.00–0.07)
Basophils Absolute: 0 10*3/uL (ref 0.0–0.1)
Basophils Relative: 0 %
Eosinophils Absolute: 0.1 10*3/uL (ref 0.0–0.5)
Eosinophils Relative: 2 %
HCT: 37.4 % (ref 36.0–46.0)
Hemoglobin: 12.1 g/dL (ref 12.0–15.0)
Immature Granulocytes: 0 %
Lymphocytes Relative: 25 %
Lymphs Abs: 2 10*3/uL (ref 0.7–4.0)
MCH: 32.1 pg (ref 26.0–34.0)
MCHC: 32.4 g/dL (ref 30.0–36.0)
MCV: 99.2 fL (ref 80.0–100.0)
Monocytes Absolute: 0.6 10*3/uL (ref 0.1–1.0)
Monocytes Relative: 7 %
Neutro Abs: 5.4 10*3/uL (ref 1.7–7.7)
Neutrophils Relative %: 66 %
Platelets: 168 10*3/uL (ref 150–400)
RBC: 3.77 MIL/uL — ABNORMAL LOW (ref 3.87–5.11)
RDW: 12.8 % (ref 11.5–15.5)
WBC: 8.1 10*3/uL (ref 4.0–10.5)
nRBC: 0 % (ref 0.0–0.2)

## 2020-02-22 LAB — COMPREHENSIVE METABOLIC PANEL
ALT: 22 U/L (ref 0–44)
AST: 25 U/L (ref 15–41)
Albumin: 3.1 g/dL — ABNORMAL LOW (ref 3.5–5.0)
Alkaline Phosphatase: 41 U/L (ref 38–126)
Anion gap: 9 (ref 5–15)
BUN: 27 mg/dL — ABNORMAL HIGH (ref 8–23)
CO2: 29 mmol/L (ref 22–32)
Calcium: 9 mg/dL (ref 8.9–10.3)
Chloride: 102 mmol/L (ref 98–111)
Creatinine, Ser: 1.11 mg/dL — ABNORMAL HIGH (ref 0.44–1.00)
GFR calc Af Amer: 55 mL/min — ABNORMAL LOW (ref >60)
GFR calc non Af Amer: 47 mL/min — ABNORMAL LOW (ref >60)
Glucose, Bld: 106 mg/dL — ABNORMAL HIGH (ref 70–99)
Potassium: 3.6 mmol/L (ref 3.5–5.1)
Sodium: 140 mmol/L (ref 135–145)
Total Bilirubin: 0.7 mg/dL (ref 0.3–1.2)
Total Protein: 6 g/dL — ABNORMAL LOW (ref 6.5–8.1)

## 2020-02-22 MED ORDER — PROSOURCE PLUS PO LIQD
30.0000 mL | Freq: Two times a day (BID) | ORAL | Status: DC
  Administered 2020-02-22 – 2020-03-11 (×33): 30 mL via ORAL
  Filled 2020-02-22 (×36): qty 30

## 2020-02-22 MED ORDER — BLOOD PRESSURE CONTROL BOOK
Freq: Once | Status: AC
  Filled 2020-02-22: qty 1

## 2020-02-22 NOTE — Plan of Care (Signed)
  Problem: RH SAFETY Goal: RH STG ADHERE TO SAFETY PRECAUTIONS W/ASSISTANCE/DEVICE Description: STG Adhere to Safety Precautions With supervision  Outcome: Progressing Goal: RH STG DECREASED RISK OF FALL WITH ASSISTANCE Description: STG Decreased Risk of Fall With mod I. Outcome: Progressing   Problem: RH COGNITION-NURSING Goal: RH STG USES MEMORY AIDS/STRATEGIES W/ASSIST TO PROBLEM SOLVE Description: STG Uses Memory Aids/Strategies With Assistance to Problem Solve. Outcome: Progressing Goal: RH STG ANTICIPATES NEEDS/CALLS FOR ASSIST W/ASSIST/CUES Description: STG Anticipates Needs/Calls for Assist With Assistance/Cues. Outcome: Progressing   Problem: RH PAIN MANAGEMENT Goal: RH STG PAIN MANAGED AT OR BELOW PT'S PAIN GOAL Outcome: Progressing

## 2020-02-22 NOTE — Progress Notes (Signed)
Occupational Therapy Assessment and Plan  Patient Details  Name: [name] MRN: [id number] Date of Birth: [birth year]  OT Diagnosis: abnormal posture, acute pain, cognitive deficits, disturbance of vision and muscle weakness (generalized) Rehab Potential: Rehab Potential (ACUTE ONLY): Good ELOS: 20-24   Today's Date: 02/22/2020 OT Individual Time: 0258-5277 OT Individual Time Calculation (min): 73 min     Hospital Problem: Principal Problem:   Stroke ([place]) L>R pontine and L cerebellar embolic infarcts, source unknown s/p tPA Active Problems:   Left pontine cerebrovascular accident [facility])   Past Medical History:  Past Medical History:  Diagnosis Date  . Arthritis   . Asthma   . Chronic back pain   . Depression   . History of pulmonary embolus (PE)   . Hx-TIA (transient ischemic attack)   . Hyperlipemia   . Hypertension   . Personal history of DVT (deep vein thrombosis)   . Scoliosis   . Sleep apnea    cpap - settingsi at 2   . 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 ARTHROSCOPIC REPAIR ACL  02/03/2004  . LEFT HEART CATH AND CORONARY ANGIOGRAPHY N/A 09/16/2019   Procedure: LEFT HEART CATH AND CORONARY ANGIOGRAPHY;  Surgeon: Nelva Bush, MD;  Location: [facility];  Service: Cardiovascular;  Laterality: N/A;  . LOOP RECORDER INSERTION N/A 02/21/2020   Procedure: LOOP RECORDER INSERTION;  Surgeon: Deboraha Sprang, MD;  Location: [facility];  Service: Cardiovascular;  Laterality: N/A;  . OOPHORECTOMY  1970  . SPINE SURGERY  03/31/2006  . 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 TUBAL LIGATION  1983    Assessment & Plan Clinical Impression: 79 year old right-handed female with history of hypertension,  hyperlipidemia, mild dementia maintained on Aricept as well as Namenda, chronic back pain maintained on oxycodone 15 mg every 4 hours as needed, DVT,Takotsubocardiomyopathy, 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 Presented 02/16/2020 with left facial droop dysarthria and visual changes. MRI of the brain showed left greater than right brainstem infarction. 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 Punctate focus of acute ischemia in the left cerebellum. CT angiogram of head and neck with no large vessel occlusion. Patient did receive TPA. Echocardiogram from 12/30/2019 showed ejection fraction 55 to 60% and no source of embolus. Admission chemistries unremarkable. EKG normal sinus rhythm. Maintain on aspirin 81 mg daily and Plavix daily for CVA prophylaxis x3 weeks then Plavix alone. Subcutaneous Lovenox for DVT prophylaxis. Venous Dopplers lower extremities negative. Plan is for loop recorder placement 02/21/2020. She is on a dysphagia #2 thin liquid diet.  Patient currently requires max to total +2 with basic self-care skills secondary to muscle weakness, decreased cardiorespiratoy endurance, impaired timing and sequencing, decreased coordination and decreased motor planning, decreased visual perceptual skills, decreased attention to left and decreased motor planning, decreased initiation, decreased problem solving, decreased memory and delayed processing and decreased sitting balance.  Prior to hospitalization, patient could complete ADLs with min.   Patient will benefit from skilled intervention to decrease level of assist with basic self-care skills prior to discharge home with care partner.  Anticipate patient will require 24 hour supervision and minimal physical assistance and follow up home health.  OT - End of Session Activity Tolerance: Tolerates 30+ min  activity with multiple rests Endurance Deficit: Yes OT Assessment Rehab Potential (ACUTE  ONLY): Good OT Barriers to Discharge: Inaccessible home environment OT Barriers to Discharge Comments: bathroom only accessible by walker OT Patient demonstrates impairments in the following area(s): Balance;Behavior;Cognition;Endurance;Motor;Pain;Perception;Safety;Vision;Sensory OT Basic ADL's Functional Problem(s): Grooming;Bathing;Dressing;Toileting;Eating OT Transfers Functional Problem(s): Toilet;Tub/Shower OT Additional Impairment(s): None OT Plan OT Intensity: Minimum of 1-2 x/day, 45 to 90 minutes OT Frequency: Total of 15 hours over 7 days of combined therapies OT Duration/Estimated Length of Stay: 20-24 OT Treatment/Interventions: Balance/vestibular training;Cognitive remediation/compensation;Community reintegration;Discharge planning;Disease mangement/prevention;DME/adaptive equipment instruction;Functional mobility training;Patient/family education;Pain management;Psychosocial support;Self Care/advanced ADL retraining;Therapeutic Activities;Visual/perceptual remediation/compensation;UE/LE Coordination activities;UE/LE Strength taining/ROM;Therapeutic Exercise;Wheelchair propulsion/positioning;Neuromuscular re-education OT Self Feeding Anticipated Outcome(s): Supervision OT Basic Self-Care Anticipated Outcome(s): MinA OT Toileting Anticipated Outcome(s): MinA OT Bathroom Transfers Anticipated Outcome(s): MinA OT Recommendation Patient destination: Home Follow Up Recommendations: Home health OT;24 hour supervision/assistance Equipment Recommended: To be determined   Skilled Therapeutic Intervention Treatment session with focus on ADL retraining, functional transfers, and static sitting balance to improve postural control for ADL tasks. Pt received semi-reclined in bed oriented x4. Sat at EOB with minA and increased time due to pt report of back pain and decreased trunk control. Sat at EOB for with minA to target static sitting balance to increase independence during EOB bathing and  dressing, providing intermittent facilitation to decrease posterior bias. Completed lateral scoot transfer bed > w/c with maxA +2 with max multimodal cues for sequencing and initiation. Completed UB bathing with modA and max multimodal cues for sequencing and awareness of LUE. Donned shirt with modA and max multimodal cues and facilitation of sequencing. Completed LB dressing total assistance +2 due to pt's heavy R lateral lean, with use of Stedy. Completed stedy transfer w/c > bed with maxA +2. Pt reporting pain in bed, contacted RN for pain medication and repositioned. Ended session with pt semi-reclined in bed with all needs in reach and bed alarm on.    OT Evaluation Precautions/Restrictions  Precautions Precautions: Fall Restrictions Weight Bearing Restrictions: No General   Vital Signs Therapy Vitals Temp: 98.5 F (36.9 C) Pulse Rate: (!) 49 Resp: 20 BP: (!) 107/42 Patient Position (if appropriate): Lying Oxygen Therapy SpO2: 91 % O2 Device: Room Air Pain Pain Assessment Pain Scale: 0-10 Pain Score: 7  Pain Type: Chronic pain Pain Location: Back Pain Orientation: Right Pain Descriptors / Indicators: Aching Pain Onset: With Activity Home Living/Prior [place] expects to be discharged to:: Private residence Living Arrangements: Children Available Help at Discharge: Family, Available 24 hours/day Type of Home: House Home Access:  (pt reported no stairs at back entrance) Entrance Stairs-Number of Steps: 6 at front entrance, level entry at back entrance [place]: One level Bathroom Shower/Tub: Door, Multimedia programmer: Standard Bathroom Accessibility: Yes Additional Comments: granddaughter lives with patient  Lives With: Other (Comment), Family, Son IADL History Homemaking Responsibilities: No Prior Function Level of Independence: Needs assistance with ADLs, Needs assistance with homemaking, Independent with gait, Requires  assistive device for independence, Independent with transfers Vocation: Retired Retail buyer) Comments: granddaughter lives with pt and helps her with showers; per nursing, son assists with med prep, IADLs and able to assist ADL ADL Grooming: Moderate assistance Where Assessed-Grooming: Sitting at sink Upper Body Bathing: Moderate assistance Where Assessed-Upper Body Bathing: Sitting at sink Lower Body Bathing: Maximal assistance Where Assessed-Lower Body Bathing: Sitting at sink Upper Body Dressing: Moderate assistance Where Assessed-Upper Body Dressing: Sitting at sink Lower Body Dressing: Dependent Where Assessed-Lower Body Dressing: Other (  Comment) (stedy) Toilet Transfer: Dependent (stedy) Toilet Transfer Method: Other (comment) (stedy) Vision Baseline Vision/History: Wears glasses Wears Glasses: Reading only Patient Visual Report: No change from baseline Vision Assessment?: Yes Eye Alignment: Impaired (comment) Ocular Range of Motion: Restricted on the left;Restricted looking up;Restricted looking down;Restricted on the right Alignment/Gaze Preference: Gaze right Tracking/Visual Pursuits: Right eye does not track medially;Right eye does not track laterally;Unable to hold eye position out of midline;Left eye does not track laterally;Left eye does not track medially Convergence: Impaired (comment) Visual Fields: Other (comment) (potentially L inattention) Depth Perception: Undershoots Additional Comments: Pt with Rt gaze preference and difficulty locating objects in periphery of L eye Perception  Perception: Impaired Comments: questionable Lt visual inattention Praxis Praxis: Impaired Praxis Impairment Details: Motor planning;Initiation Cognition Overall Cognitive Status: Impaired/Different from baseline Arousal/Alertness: Lethargic Orientation Level: Person;Place;Situation Person: Oriented Place: Oriented Situation: Oriented Year: 2021 Month: July Day of Week:  Incorrect Memory: Impaired Memory Impairment: Retrieval deficit;Decreased short term memory Decreased Short Term Memory: Verbal basic Immediate Memory Recall: Sock;Blue;Bed Memory Recall Sock: Not able to recall Memory Recall Blue: Without Cue Memory Recall Bed: Without Cue Attention: Focused;Sustained Focused Attention: Appears intact Sustained Attention: Impaired Sustained Attention Impairment: Verbal basic;Functional basic Selective Attention: Impaired Selective Attention Impairment: Verbal basic;Functional basic Awareness: Impaired Awareness Impairment: Emergent impairment Problem Solving: Impaired Problem Solving Impairment: Verbal basic;Functional basic Executive Function: Reasoning;Sequencing;Initiating;Decision Making;Self Correcting;Organizing Reasoning: Impaired Sequencing: Impaired Organizing: Impaired Initiating: Impaired Self Correcting: Impaired Safety/Judgment: Impaired Sensation Sensation Light Touch: Appears Intact Additional Comments: per pt report Coordination Gross Motor Movements are Fluid and Coordinated: No Fine Motor Movements are Fluid and Coordinated: No Coordination and Movement Description: Gross incoordination due to heavy R lateral lean, decreased postural control, limited by pain and generalized weakness Finger Nose Finger Test: undershoots, difficulty maintaining focused attention Motor  Motor Motor: Abnormal postural alignment and control Motor - Skilled Clinical Observations: heavy R lateral lean, weakness resulting in postural instability Mobility  Bed Mobility Bed Mobility: Rolling Right;Rolling Left Rolling Right: Moderate Assistance - Patient 50-74% Rolling Left: Moderate Assistance - Patient 50-74% Left Sidelying to Sit: Maximal Assistance - Patient 25-49% Sit to Supine: Maximal Assistance - Patient 25-49% Transfers Sit to Stand: 2 Helpers  Trunk/Postural Assessment  Cervical Assessment Cervical Assessment: Exceptions to [facility]  (forward head carriage) Thoracic Assessment Thoracic Assessment: Exceptions to [facility] (kyphotic posture) Lumbar Assessment Lumbar Assessment: Exceptions to [facility] (posterior pelvic tilt) Postural Control Postural Control: Deficits on evaluation (decreased/delayed)  Balance Balance Balance Assessed: Yes Static Sitting Balance Static Sitting - Balance Support: Feet supported;Bilateral upper extremity supported Static Sitting - Level of Assistance: 4: Min assist Dynamic Sitting Balance Dynamic Sitting - Balance Support: Feet supported;Bilateral upper extremity supported Dynamic Sitting - Level of Assistance: 3: Mod assist Dynamic Sitting - Balance Activities: Forward lean/weight shifting Sitting balance - Comments: relaint on support from BUE Static Standing Balance Static Standing - Balance Support: During functional activity Static Standing - Level of Assistance: 1: +2 Total assist Dynamic Standing Balance Dynamic Standing - Balance Support: During functional activity Dynamic Standing - Level of Assistance: 1: +2 Total assist Extremity/Trunk Assessment RUE Assessment RUE Assessment: Exceptions to [facility] Active Range of Motion (AROM) Comments: ~120 shoulder flexion during functional activity General Strength Comments: grossly 4/5 LUE Assessment LUE Assessment: Exceptions to [facility] Active Range of Motion (AROM) Comments: ~130 shoulder flexion during functional activity General Strength Comments: grossly 4/5     Refer to Care Plan for Long Term Goals  Recommendations for other services: None    Discharge  Criteria: Patient will be discharged from OT if patient refuses treatment 3 consecutive times without medical reason, if treatment goals not met, if there is a change in medical status, if patient makes no progress towards goals or if patient is discharged from hospital.  The above assessment, treatment plan, treatment alternatives and goals were discussed and mutually agreed upon: by  patient  Michelle Nasuti 02/22/2020, 6:52 AM

## 2020-02-22 NOTE — Progress Notes (Signed)
Patient information reviewed and entered into [facility] System by Becky Caeleb Batalla, PPS coordinator. Information including medical coding, function ability, and quality indicators will be reviewed and updated through discharge.   

## 2020-02-22 NOTE — Evaluation (Signed)
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 Arthritis   . Asthma   . Chronic back pain   . Depression   . History of pulmonary embolus (PE)   . Hx-TIA (transient ischemic attack)   . Hyperlipemia   . Hypertension   . Personal history of DVT (deep vein thrombosis)   . Scoliosis   . Sleep apnea    cpap - settingsi at 3   . 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 ARTHROSCOPIC REPAIR ACL  02/03/2004  . LEFT HEART CATH AND CORONARY ANGIOGRAPHY N/A 09/16/2019   Procedure: LEFT HEART CATH AND CORONARY ANGIOGRAPHY;  Surgeon: Nelva Bush, MD;  Location: [place] [facility];  Service: Cardiovascular;  Laterality: N/A;  . LOOP RECORDER INSERTION N/A 02/21/2020   Procedure: LOOP RECORDER INSERTION;  Surgeon: Deboraha Sprang, MD;  Location: [place] [facility];  Service: Cardiovascular;  Laterality: N/A;  . OOPHORECTOMY  1970  . SPINE SURGERY  03/31/2006  . 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 TUBAL LIGATION  1983    Assessment / Plan / Recommendation Clinical Impression 79 year old right-handed female with history of hypertension,  hyperlipidemia, mild dementia maintained on Aricept as well as Namenda, chronic back pain maintained on oxycodone 15 mg every 4 hours as needed, DVT,Takotsubocardiomyopathy, 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 Presented 02/16/2020 with left facial droop dysarthria and visual changes. MRI of the brain showed left greater than right brainstem infarction. 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 Punctate focus of acute ischemia in the left cerebellum. CT angiogram of head and neck with no large vessel occlusion. Patient did receive TPA. Echocardiogram from 12/30/2019 showed ejection fraction 55 to 60% and no source of embolus. Admission chemistries unremarkable. EKG normal sinus rhythm. Maintain on aspirin 81 mg daily and Plavix daily for CVA prophylaxis x3 weeks then Plavix alone. Subcutaneous Lovenox for DVT prophylaxis. Venous Dopplers lower extremities negative. Plan is for loop recorder placement 02/21/2020. She is on a dysphagia #2 thin liquid diet.  Pt demonstrated severe-moderate cognitive linguistic impairments, deficits include delayed recall, basic problem solving, emergent awareness, and sustained attention. Pt has noted vision changes in left eye. Pt demonstrated intellectual awareness as well as orientation x4. Pt expressed changes in cognitive/mobility function over the past few days, stating its "harder to think and move." Pt demonstrated functional problem solving with ability to follow commands during bed mobility tasks (SLP assisted NT with pt's bedsheet/brief change) with moderate verbal cues, locate call bell, self-feed with initial set up and intermittent assistance accessing items on tray. Cognitive linguistic assessment SLUMS was administered in which pt scored 8 out 30 (n=>20 reflecting education level) with noted deficits in delayed recall, clock drawing, calculations, listing  numbers backwards and divergent naming (reflecting  impairments in attention and memory verse language.) Pt expressed changes in speech due to left facial droop, but is 100% intelligible at the sentence level. Pt demonstrated ability to respond to questions appropriately with limited spontaneous verbal output, suggest due to fatigue verse language impairment. Pt demonstrates mild oral dysphagia, with prolonged mastication with appropriate pattern due to missing upper teeth, mild anterior spillage and mild oral residue. Pt demonstrated oral clearance with dys 2 and dys 3 textured trials, given extra time for mastication and following with intermittent liquid wash. Pt's demonstrated appropriate oral control, swallow appeared timely and no over s/s aspiration noted on small/limited thin liquids via cup/straw, dys 2 and dys 3 texture trials. SLP recommends current diet of thin liquids and dys 2 textures with set up assist and intermittent supervision A for accessing items on tray and diet tolerance. SLP will continue solid advancement trials at bedside. Pt would benefit from skills ST services in order to maximize functional independence and reduce burden of care in cognitive and swallow skills, requiring 24 hour supervision and continued ST services.   Skilled Therapeutic Interventions          Skilled ST services focused on cognitive skills. SLP facilitated cognitive linguistic assessment, provided education on current deficits and plan for treatment. All questions answered to satisfaction. Pt was left in room with call bell within reach and bed alarm set. ST recommends to continue skilled ST services.  SLP Assessment  Patient will need skilled Speech Lanaguage Pathology Services during CIR admission    Recommendations  SLP Diet Recommendations: Thin;Dysphagia 2 (Fine chop) Liquid Administration via: Spoon;Cup Medication Administration: Whole meds with liquid Supervision: Patient able to self feed;Intermittent supervision to cue for compensatory strategies  (tray set up) Compensations: Slow rate;Small sips/bites Postural Changes and/or Swallow Maneuvers: Seated upright 90 degrees Oral Care Recommendations: Oral care BID Patient destination: Home Follow up Recommendations: None Equipment Recommended: None recommended by SLP    SLP Frequency 3 to 5 out of 7 days   SLP Duration  SLP Intensity  SLP Treatment/Interventions 2.5-3 weeks  Minumum of 1-2 x/day, 30 to 90 minutes  Cognitive remediation/compensation;Cueing hierarchy;Dysphagia/aspiration precaution training;Functional tasks;Internal/external aids;Patient/family education    Pain Pain Assessment Pain Score: 0-No pain Pain Type: Chronic pain Pain Location: Back Pain Orientation: Right Pain Descriptors / Indicators: Aching Pain Onset: With Activity Pain Intervention(s): RN made aware;Repositioned  Prior Functioning Cognitive/Linguistic Baseline: Baseline deficits Baseline deficit details: son completes all IADLs Type of Home: House  Lives With: Other (Comment);Family;Son Available Help at Discharge: Family;Available 24 hours/day Vocation: Retired Retail buyer)  SLP Evaluation Cognition Overall Cognitive Status: Impaired/Different from baseline Arousal/Alertness: Lethargic Orientation Level: Oriented X4 Attention: Focused;Sustained Focused Attention: Appears intact Sustained Attention: Impaired Sustained Attention Impairment: Verbal basic;Functional basic Selective Attention: Impaired Selective Attention Impairment: Verbal basic;Functional basic Memory: Impaired Memory Impairment: Retrieval deficit;Decreased short term memory Decreased Short Term Memory: Verbal basic Immediate Memory Recall: Sock;Blue;Bed Memory Recall Sock: Not able to recall Memory Recall Blue: Without Cue Memory Recall Bed: Without Cue Awareness: Impaired Awareness Impairment: Emergent impairment Problem Solving: Impaired Problem Solving Impairment: Verbal basic;Functional basic Executive  Function: Reasoning;Sequencing;Initiating;Decision Making;Self Correcting;Organizing Reasoning: Impaired Sequencing: Impaired Organizing: Impaired Initiating: Impaired Self Correcting: Impaired Safety/Judgment: Impaired  Comprehension Auditory Comprehension Overall Auditory Comprehension: Appears within functional limits for tasks assessed Expression Expression Primary Mode of Expression: Verbal Verbal Expression Overall Verbal Expression: Appears within functional limits for tasks assessed Oral Motor Oral Motor/Sensory Function Overall Oral Motor/Sensory Function: Mild  impairment Facial ROM: Reduced left;Suspected CN VII (facial) dysfunction Facial Symmetry: Abnormal symmetry left Facial Strength: Suspected CN VII (facial) dysfunction;Reduced left Lingual ROM: Within Functional Limits Lingual Symmetry: Within Functional Limits Velum: Within Functional Limits Mandible: Within Functional Limits Motor Speech Overall Motor Speech: Impaired Respiration: Within functional limits Phonation: Normal Resonance: Within functional limits Articulation: Impaired Level of Impairment: Sentence Intelligibility: Intelligible Motor Planning: Witnin functional limits Motor Speech Errors: Not applicable (facial weakness)   PMSV Assessment  PMSV Trial Intelligibility: Intelligible  Bedside Swallowing Assessment General Previous Swallow Assessment: none Diet Prior to this Study: Dysphagia 2 (chopped);Thin liquids Respiratory Status: Room air History of Recent Intubation: No Behavior/Cognition: Alert;Cooperative;Pleasant mood Oral Cavity - Dentition: Missing dentition Self-Feeding Abilities: Able to feed self;Needs set up (intermittent) Patient Positioning: Upright in bed Baseline Vocal Quality: Normal Volitional Cough: Weak Volitional Swallow: Able to elicit  Oral Care Assessment Does patient have any of the following "high(er) risk" factors?: None of the above Does patient have any  of the following "at risk" factors?: Other - dysphagia Patient is LOW RISK: Follow universal precautions (see row information) Ice Chips Ice chips: Not tested Thin Liquid Thin Liquid: Within functional limits Presentation: Cup;Straw Nectar Thick Nectar Thick Liquid: Not tested Honey Thick Honey Thick Liquid: Not tested Puree Puree: Not tested Solid Solid: Impaired Oral Phase Impairments: Impaired mastication;Other (comment);Reduced labial seal BSE Assessment Risk for Aspiration Impact on safety and function: Mild aspiration risk  Short Term Goals: Week 1: SLP Short Term Goal 1 (Week 1): Pt will complete familar and functional problem solving tasks with min A verbal cues. SLP Short Term Goal 2 (Week 1): Pt will self-monitor and self-correct errors in functional tasks with min A verbal cues. SLP Short Term Goal 3 (Week 1): Pt will demonstrate sustained attention in 10 minute intervals with min A verbal cues for redirection during functional tasks. SLP Short Term Goal 4 (Week 1): Pt will utilizie external aids to recall novel and daily information with mod A verbal cues. SLP Short Term Goal 5 (Week 1): Pt will consume dys 2 textures and thin liquid diet with mod I use of swallow strategies. SLP Short Term Goal 6 (Week 1): Pt will consume trials of dys 3 textures over x3 sessions with apporpriate oral clearance and no overt s/s aspiration with supervision A verbal cues.  Refer to Care Plan for Long Term Goals  Recommendations for other services: None   Discharge Criteria: Patient will be discharged from SLP if patient refuses treatment 3 consecutive times without medical reason, if treatment goals not met, if there is a change in medical status, if patient makes no progress towards goals or if patient is discharged from hospital.  The above assessment, treatment plan, treatment alternatives and goals were discussed and mutually agreed upon: by patient  Jeneane Pieczynski  [facility] 02/22/2020, 3:19  PM

## 2020-02-22 NOTE — Progress Notes (Signed)
[place] PHYSICAL MEDICINE & REHABILITATION PROGRESS NOTE  Subjective/Complaints: Patient seen laying in bed this morning.  She states she slept well overnight.  She states she is ready begin therapies.  ROS: Denies CP, SOB, N/V/D  Objective: Vital Signs: Blood pressure (!) 107/42, pulse (!) 49, temperature 98.5 F (36.9 C), resp. rate 20, height 5\' 2"  (1.575 m), weight 83.8 kg, SpO2 91 %. VAS LOWER EXTREMITY VENOUS (DVT)  Result Date: 02/21/2020  Lower Venous DVT Study Indications: Stroke.  Limitations: Postioning secondary to stroke. Comparison Study: Prior study from 09/30/16 is available for comparison Performing Technologist: 10/02/16 RVS  Examination Guidelines: A complete evaluation includes B-mode imaging, spectral Doppler, color Doppler, and power Doppler as needed of all accessible portions of each vessel. Bilateral testing is considered an integral part of a complete examination. Limited examinations for reoccurring indications may be performed as noted. The reflux portion of the exam is performed with the patient in reverse Trendelenburg.  +---------+---------------+---------+-----------+----------+--------------+ RIGHT    CompressibilityPhasicitySpontaneityPropertiesThrombus Aging +---------+---------------+---------+-----------+----------+--------------+ CFV      Full           Yes      Yes                                 +---------+---------------+---------+-----------+----------+--------------+ SFJ      Full                                                        +---------+---------------+---------+-----------+----------+--------------+ FV Prox  Full                                                        +---------+---------------+---------+-----------+----------+--------------+ FV Mid   Full                                                        +---------+---------------+---------+-----------+----------+--------------+ FV DistalFull                                                         +---------+---------------+---------+-----------+----------+--------------+ PFV      Full                                                        +---------+---------------+---------+-----------+----------+--------------+ POP      Full           Yes      Yes                                 +---------+---------------+---------+-----------+----------+--------------+ PTV  Full                                                        +---------+---------------+---------+-----------+----------+--------------+ PERO     Full                                                        +---------+---------------+---------+-----------+----------+--------------+   +---------+---------------+---------+-----------+----------+--------------+ LEFT     CompressibilityPhasicitySpontaneityPropertiesThrombus Aging +---------+---------------+---------+-----------+----------+--------------+ CFV      Full           Yes      Yes                                 +---------+---------------+---------+-----------+----------+--------------+ SFJ      Full                                                        +---------+---------------+---------+-----------+----------+--------------+ FV Prox  Full                                                        +---------+---------------+---------+-----------+----------+--------------+ FV Mid   Full                                                        +---------+---------------+---------+-----------+----------+--------------+ FV DistalFull                                                        +---------+---------------+---------+-----------+----------+--------------+ PFV      Full                                                        +---------+---------------+---------+-----------+----------+--------------+ POP      Full           Yes      Yes                                  +---------+---------------+---------+-----------+----------+--------------+ PTV      Full                                                        +---------+---------------+---------+-----------+----------+--------------+  PERO     Full                                                        +---------+---------------+---------+-----------+----------+--------------+   Left Technical Findings: Not visualized segments include portions of the posterior tibial and peroneal veins.   Summary: RIGHT: - Findings appear essentially unchanged compared to previous examination. - There is no evidence of deep vein thrombosis in the lower extremity.  LEFT: - Findings appear essentially unchanged compared to previous examination. - There is no evidence of deep vein thrombosis in the lower extremity. However, portions of this examination were limited- see technologist comments above.  *See table(s) above for measurements and observations. Electronically signed by Coral Else MD on 02/21/2020 at 6:32:48 AM.    Final    Recent Labs    02/21/20 0442 02/22/20 0545  WBC 9.4 8.1  HGB 12.7 12.1  HCT 39.8 37.4  PLT 152 168   Recent Labs    02/21/20 0442 02/22/20 0545  NA 140 140  K 3.5 3.6  CL 104 102  CO2 24 29  GLUCOSE 83 106*  BUN 21 27*  CREATININE 1.21* 1.11*  CALCIUM 9.1 9.0    Physical Exam: BP (!) 107/42 (BP Location: Right Arm)   Pulse (!) 49   Temp 98.5 F (36.9 C)   Resp 20   Ht 5\' 2"  (1.575 m)   Wt 83.8 kg   SpO2 91%   BMI 33.79 kg/m  Constitutional: No distress . Vital signs reviewed. HENT: Normocephalic.  Atraumatic. Eyes: Limitation with lateral gaze . No discharge. Cardiovascular: No JVD. Respiratory: Normal effort.  No stridor. GI: Non-distended. Skin: Warm and dry.  Intact. Psych: Normal mood.  Normal behavior. Musc: Bilateral lower extremity edema Neuro:  Alert Makes good eye contact with examiner.   Mild left facial droop.   Dysarthria Motor: Bilateral  upper extremities: 4+/5 proximal distal Left lower extremity: 4/5 proximal distal Right lower extremity: Hip flexion, knee extension 3+/5, ankle dorsiflexion 4/5  Assessment/Plan: 1. 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  Physiatrist is providing close team supervision and 24 hour management of active medical problems listed below.  Physiatrist and rehab team continue to assess barriers to discharge/monitor patient progress toward functional and medical goals  Care Tool:  Bathing              Bathing assist       Upper Body Dressing/Undressing Upper body dressing        Upper body assist      Lower Body Dressing/Undressing Lower body dressing            Lower body assist       Toileting Toileting    Toileting assist Assist for toileting: Dependent - Patient 0%     Transfers Chair/bed transfer  Transfers assist           Locomotion Ambulation   Ambulation assist              Walk 10 feet activity   Assist           Walk 50 feet activity   Assist           Walk 150 feet activity  Assist           Walk 10 feet on uneven surface  activity   Assist           Wheelchair     Assist               Wheelchair 50 feet with 2 turns activity    Assist            Wheelchair 150 feet activity     Assist            Medical Problem List and Plan: 1.  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  Status post TPA.  Status post loop recorder placement   Begin CIR evaluations  2.  Antithrombotics: -DVT/anticoagulation: Lovenox.  Vascular study negative             -antiplatelet therapy: Aspirin 81 mg daily and Plavix 85 mg daily x3 weeks then Plavix alone 3. Pain  Management: Oxycodone 15 mg every 4 hours as needed chronic pain  Monitor with increased exertion 4. Mood: Melatonin 3 mg nightly, Remeron 7.5 mg nightly as needed,  BuSpar 30 mg twice daily             -antipsychotic agents: N/A 5. Neuropsych: This patient is ?fully capable of making decisions on her own behalf. 6. Skin/Wound Care: Routine skin checks 7. Fluids/Electrolytes/Nutrition: Routine in and outs  Reordered daily multivitamin she takes at home 8.  Hypertension.  Monitor with increased mobility  Lisinopril 10, amlodipine 10 PTA   Lisinopril 5 started on 7/12   Monitor with increased mobility 9.  Hyperlipidemia.    Continue Lipitor 10.  Obesity.  BMI 35.56.  Follow-up dietary 11.  OSA.  Patient on CPAP at home. 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  Follow-up outpatient cardiology service 13. Dementia: Namenda 28 mg daily, Aricept 10 mg nightly. 14.  AKI  Creatinine 1.11 on 7/13  Encourage fluids 15.  Mild hypoalbuminemia  Supplement initiated on 7/13   LOS: 1 days A FACE TO FACE EVALUATION WAS PERFORMED  Valrie Jia [name] 02/22/2020, 8:40 AM

## 2020-02-22 NOTE — Evaluation (Addendum)
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 Arthritis   . Asthma   . Chronic back pain   . Depression   . History of pulmonary embolus (PE)   . Hx-TIA (transient ischemic attack)   . Hyperlipemia   . Hypertension   . Personal history of DVT (deep vein thrombosis)   . Scoliosis   . Sleep apnea    cpap - settingsi at 3   . 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 ARTHROSCOPIC REPAIR ACL  02/03/2004  . LEFT HEART CATH AND CORONARY ANGIOGRAPHY N/A 09/16/2019   Procedure: LEFT HEART CATH AND CORONARY ANGIOGRAPHY;  Surgeon: Nelva Bush, MD;  Location: [place] [facility];  Service: Cardiovascular;  Laterality: N/A;  . LOOP RECORDER INSERTION N/A 02/21/2020   Procedure: LOOP RECORDER INSERTION;  Surgeon: Deboraha Sprang, MD;  Location: [place] [facility];  Service: Cardiovascular;  Laterality: N/A;  . OOPHORECTOMY  1970  . SPINE SURGERY  03/31/2006  . 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 TUBAL LIGATION  1983    Assessment & Plan Clinical Impression:  Patient is a 79 y.o. year old right-handed female with history of hypertension, hyperlipidemia, mild dementia maintained on Aricept as well as Namenda, chronic back pain maintained on oxycodone 15 mg every 4 hours as needed, DVT, Takotsubo cardiomyopathy, 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  Per chart review patient lives with her granddaughter.  1 level home 6 steps to entry.  Her granddaughter works at night however patient states that she has a son in the area as well as a daughter-in-law that works different times.  Patient ambulates with a rolling walker for household distances prior to admission.  Granddaughter does assist with some bathing and ADLs.  Presented 02/16/2020 with left facial droop dysarthria and visual changes.  MRI of the brain showed left greater than right brainstem infarction.  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  Punctate focus of acute ischemia in the left cerebellum.  CT angiogram of head and neck with no large vessel occlusion.  Patient did receive TPA.  Echocardiogram from 12/30/2019 showed ejection fraction 55 to 60% and no source of embolus.  Admission chemistries unremarkable.  EKG normal sinus rhythm.  Maintain on aspirin 81 mg daily and Plavix  daily for CVA prophylaxis x3 weeks then Plavix alone.  Subcutaneous Lovenox for DVT prophylaxis.  Venous Dopplers lower extremities negative.  Plan is for loop recorder placement 02/21/2020.  She is on a dysphagia #2 thin liquid diet.  Patient was admitted for a comprehensive rehab program..  Patient transferred to CIR on 02/21/2020 .   Patient currently requires  total with mobility secondary to muscle weakness, decreased cardiorespiratoy endurance, impaired timing and sequencing, unbalanced muscle activation, decreased coordination and decreased motor planning, decreased visual acuity, decreased visual perceptual skills and decreased visual motor skills, decreased attention to left, decreased  attention to right and decreased motor planning, decreased initiation, decreased attention, decreased awareness, decreased problem solving, decreased safety awareness, decreased memory and delayed processing and decreased sitting balance, decreased standing balance, decreased postural control, decreased balance strategies and difficulty maintaining precautions.  Prior to hospitalization, patient was modified independent  with mobility and lived with Other (Comment), Family, Son (granddaughter) in a House home.  Home access is   (pt reported no stairs at back entrance).  Patient will benefit from skilled PT intervention to maximize safe functional mobility, minimize fall risk and decrease caregiver burden for planned discharge home with 24 hour assist.  Anticipate patient will benefit from follow up [facility] at discharge.  PT - End of Session Activity Tolerance: Tolerates 30+ min activity with multiple rests Endurance Deficit: Yes PT Assessment Rehab Potential (ACUTE/IP ONLY): Good PT Barriers to Discharge: Behavior;Other (comments) PT Barriers to Discharge Comments: new decreased cognition with impaired safety awareness and initiation; new onset of functional impairments PT Patient demonstrates impairments in the following area(s): Balance;Safety;Behavior;Endurance;Motor;Pain PT Transfers Functional Problem(s): Bed Mobility;Bed to Chair;Car;Furniture PT Locomotion Functional Problem(s): Ambulation PT Plan PT Intensity: Minimum of 1-2 x/day ,45 to 90 minutes PT Frequency: 5 out of 7 days PT Duration Estimated Length of Stay: 3 weeks PT Treatment/Interventions: Ambulation/gait training;Community reintegration;DME/adaptive equipment instruction;Neuromuscular re-education;Stair training;Psychosocial support;Wheelchair propulsion/positioning;UE/LE Strength taining/ROM;Balance/vestibular training;Discharge planning;Pain management;Skin care/wound management;Therapeutic Activities;UE/LE Coordination  activities;Cognitive remediation/compensation;Disease management/prevention;Functional mobility training;Patient/family education;Splinting/orthotics;Therapeutic Exercise;Visual/perceptual remediation/compensation PT Transfers Anticipated Outcome(s): min A PT Locomotion Anticipated Outcome(s): min A with LRAD for 50' PT Recommendation Recommendations for Other Services: Neuropsych consult Follow Up Recommendations: Home health PT Patient destination: Home Equipment Recommended: To be determined Equipment Details: pt reports having SPC, manual WC, BSC, and shower chair. will determine additional needs  Skilled Therapeutic Intervention Evaluation completed (see details above and below) with education on PT POC and goals and individual treatment initiated with focus on strengthening, balance training, gait training, safety awareness, improved initiation, and attention to task.  Pt directed in additional intervention post evaluation with pt benefiting from max A x2 for STS from bed level with Stedy with VC for safety awareness, midline standing, increased trunk extension and head carriage, at attention to task to improve hand and foot placement. Pt also directed in squat pivot from WC to bed at max A x2 with pt able to participate however required single step cues to weight shift, initiate, and for hand and foot placement. Pt also directed in simulated sedan height car transfer to L with pt requiring max A x2 with increased VC and tactile cues for technique and manual assistance/facilitation for trunk anterior weight shift, BLE placement and maneuvering throughout, and for weight shifting to pivot to/from car seat.    PT Evaluation Precautions/Restrictions Precautions Precautions: Fall Restrictions Weight Bearing Restrictions: No Home Living/Prior Functioning Home Living Available Help at Discharge: Family;Available 24 hours/day (son available 24/7, granddaughter lives with pt and is available  off work hours) Type of Home: House Home Access:  (pt reported no stairs at back entrance) Home Layout: One level Bathroom Shower/Tub: Door;Walk-in Radio producer: Standard Bathroom Accessibility: Yes Additional Comments: granddaughter lives with patient  Lives With: Other (Comment);Family;Son (granddaughter) Prior Function Level of Independence: Needs assistance with ADLs;Needs assistance with homemaking;Independent with gait;Requires assistive device for  independence;Independent with transfers Vocation: Retired Comments: granddaughter lives with pt and helps her with showers; per nursing, son assists with med prep, IADLs and able to assist Vision/Perception  Vision - Assessment Eye Alignment: Impaired (comment) Ocular Range of Motion: Restricted on the left;Restricted looking up;Restricted looking down;Restricted on the right Alignment/Gaze Preference: Gaze right Tracking/Visual Pursuits: Right eye does not track medially;Right eye does not track laterally;Unable to hold eye position out of midline;Left eye does not track laterally;Left eye does not track medially Convergence: Impaired (comment) Additional Comments: Pt with Rt gaze preference and difficulty locating objects in periphery of L eye Perception Perception: Impaired Praxis Praxis: Impaired Praxis Impairment Details: Motor planning;Initiation  Cognition Overall Cognitive Status: Impaired/Different from baseline Arousal/Alertness: Lethargic Orientation Level: Oriented X4 Attention: Focused;Selective;Sustained Focused Attention: Appears intact Sustained Attention: Impaired Sustained Attention Impairment: Verbal basic;Functional basic Selective Attention: Impaired Selective Attention Impairment: Verbal basic;Functional basic Memory: Impaired Memory Impairment: Retrieval deficit;Decreased short term memory Decreased Short Term Memory: Verbal basic Immediate Memory Recall: Sock;Blue;Bed Memory Recall Sock: With  Cue Memory Recall Blue: Without Cue Memory Recall Bed: Without Cue Awareness: Impaired Awareness Impairment: Intellectual impairment;Emergent impairment;Anticipatory impairment Problem Solving: Impaired Problem Solving Impairment: Verbal basic;Functional basic Executive Function: Reasoning;Sequencing;Initiating;Decision Making;Self Correcting;Organizing Reasoning: Impaired Sequencing: Impaired Organizing: Impaired Initiating: Impaired Self Correcting: Impaired Safety/Judgment: Impaired Sensation Sensation Light Touch: Appears Intact Additional Comments: per pt report Coordination Gross Motor Movements are Fluid and Coordinated: No Fine Motor Movements are Fluid and Coordinated: No Coordination and Movement Description: Gross incoordination due to heavy R lateral lean, decreased postural control, limited by pain and generalized weakness Finger Nose Finger Test: undershoots, difficulty maintaining focused attention Motor  Motor Motor: Abnormal postural alignment and control Motor - Skilled Clinical Observations: heavy R lateral lean, weakness resulting in postural instability  Mobility Bed Mobility Bed Mobility: Rolling Right;Rolling Left;Supine to Sit;Sit to Supine Rolling Right: Moderate Assistance - Patient 50-74% Rolling Left: Moderate Assistance - Patient 50-74% Supine to Sit: Moderate Assistance - Patient 50-74% Sit to Supine: 2 Helpers Transfers Transfers: Sit to [employer] Transfers Sit to Stand: 2 Helpers Stand Pivot Transfers: 2 Press photographer (Assistive device): Standard walker Locomotion  Gait Ambulation: No Gait Gait: No Stairs / Additional Locomotion Stairs: No Wheelchair Mobility Wheelchair Mobility: Yes Wheelchair Assistance: Maximal Assistance - Patient 25 - 49% (self propelled 15' +45' min A) Wheelchair Propulsion: Both upper extremities Distance: 150' total  Trunk/Postural Assessment  Cervical Assessment Cervical Assessment: Exceptions to  [facility] (forward head carriage) Thoracic Assessment Thoracic Assessment: Exceptions to [facility] (kyphotic posture) Lumbar Assessment Lumbar Assessment: Exceptions to [employer] (posterior pelvic tilt) Postural Control Postural Control: Deficits on evaluation (decreased/delayed)  Balance Balance Balance Assessed: Yes Static Sitting Balance Static Sitting - Balance Support: Feet supported;Bilateral upper extremity supported Static Sitting - Level of Assistance: 4: Min assist Dynamic Sitting Balance Dynamic Sitting - Balance Support: Feet supported;Bilateral upper extremity supported Dynamic Sitting - Level of Assistance: 3: Mod assist Dynamic Sitting - Balance Activities: Forward lean/weight shifting Sitting balance - Comments: relaint on support from BUE Static Standing Balance Static Standing - Balance Support: During functional activity Static Standing - Level of Assistance: 1: +2 Total assist Dynamic Standing Balance Dynamic Standing - Balance Support: During functional activity Dynamic Standing - Level of Assistance: 1: +2 Total assist Extremity Assessment  RUE Assessment RUE Assessment: Exceptions to [facility] Active Range of Motion (AROM) Comments: ~120 shoulder flexion during functional activity LUE Assessment LUE Assessment: Exceptions to [facility] Active Range of Motion (AROM) Comments: ~130 shoulder flexion during functional activity  RLE Assessment RLE Assessment: Exceptions to [facility] General Strength Comments: pt grossly in sitting 2+/5 assessed functionally as pt demoed difficulty attending LLE Assessment LLE Assessment: Exceptions to [facility] General Strength Comments: grossly 2+/5 assessed functionally    Refer to Care Plan for Long Term Goals  Recommendations for other services: Neuropsych  Discharge Criteria: Patient will be discharged from PT if patient refuses treatment 3 consecutive times without medical reason, if treatment goals not met, if there is a change in medical status, if patient  makes no progress towards goals or if patient is discharged from hospital.  The above assessment, treatment plan, treatment alternatives and goals were discussed and mutually agreed upon: by patient  Junie Panning, PT Apolinar Junes PT, DPT   02/22/2020, 1:36 PM

## 2020-02-22 NOTE — Care Management (Signed)
Patient ID: [name], female   DOB: 02-18-1941, 79 y.o.   MRN: [id number] Met with the patient to review role of the CM and collaboration with SW Margreta Journey) to facilitate preparation for discharge. Reviewed secondary risk factors and DAPT with rationale for loop recorder monitoring. Reviewed pontine infarct and relationship with incontinence and trial timed toileting. Reviewed handouts and educational handbooks for HTN and DASH diet. No other concerns at present. Margarito Liner

## 2020-02-23 ENCOUNTER — Inpatient Hospital Stay (HOSPITAL_COMMUNITY): Payer: Medicare Other | Admitting: Physical Therapy

## 2020-02-23 ENCOUNTER — Inpatient Hospital Stay (HOSPITAL_COMMUNITY): Payer: Medicare Other | Admitting: Speech Pathology

## 2020-02-23 ENCOUNTER — Inpatient Hospital Stay (HOSPITAL_COMMUNITY): Payer: Medicare Other | Admitting: Occupational Therapy

## 2020-02-23 DIAGNOSIS — K5901 Slow transit constipation: Secondary | ICD-10-CM

## 2020-02-23 DIAGNOSIS — G894 Chronic pain syndrome: Secondary | ICD-10-CM

## 2020-02-23 DIAGNOSIS — I10 Essential (primary) hypertension: Secondary | ICD-10-CM

## 2020-02-23 MED ORDER — FLEET ENEMA 7-19 GM/118ML RE ENEM: 1.0000 | ENEMA | Freq: Every day | RECTAL | Status: DC | PRN

## 2020-02-23 MED ORDER — FLEET ENEMA 7-19 GM/118ML RE ENEM
1.0000 | ENEMA | Freq: Once | RECTAL | Status: AC
  Administered 2020-02-23: 1 via RECTAL
  Filled 2020-02-23: qty 1

## 2020-02-23 MED ORDER — POLYETHYLENE GLYCOL 3350 17 G PO PACK
17.0000 g | PACK | Freq: Two times a day (BID) | ORAL | Status: DC
  Administered 2020-02-23 – 2020-03-11 (×33): 17 g via ORAL
  Filled 2020-02-23 (×36): qty 1

## 2020-02-23 NOTE — Progress Notes (Signed)
Patient ID: [name], female   DOB: 01/30/1941, 79 y.o.   MRN: [id number] Team Conference Report to Patient/Family  Team Conference discussion was reviewed with the patient and caregiver, including goals, any changes in plan of care and target discharge date.  Patient and caregiver express understanding and are in agreement.  The patient has a target discharge date of 03/16/20.  Andria Rhein 02/23/2020, 1:59 PM

## 2020-02-23 NOTE — Progress Notes (Addendum)
[facility] PHYSICAL MEDICINE & REHABILITATION PROGRESS NOTE  Subjective/Complaints: Patient seen laying in bed this morning.  She states she slept well overnight.  She states she had a good first day of therapies yesterday.  ROS: Denies CP, SOB, N/V/D  Objective: Vital Signs: Blood pressure (!) 105/51, pulse (!) 52, temperature 98.1 F (36.7 C), temperature source Oral, resp. rate 16, height 5\' 2"  (1.575 m), weight 83.8 kg, SpO2 92 %. No results found. Recent Labs    02/21/20 0442 02/22/20 0545  WBC 9.4 8.1  HGB 12.7 12.1  HCT 39.8 37.4  PLT 152 168   Recent Labs    02/21/20 0442 02/22/20 0545  NA 140 140  K 3.5 3.6  CL 104 102  CO2 24 29  GLUCOSE 83 106*  BUN 21 27*  CREATININE 1.21* 1.11*  CALCIUM 9.1 9.0    Physical Exam: BP (!) 105/51 (BP Location: Right Arm)   Pulse (!) 52   Temp 98.1 F (36.7 C) (Oral)   Resp 16   Ht 5\' 2"  (1.575 m)   Wt 83.8 kg   SpO2 92%   BMI 33.79 kg/m  Constitutional: No distress . Vital signs reviewed. HENT: Normocephalic.  Atraumatic. Eyes: Limitations with lateral gaze.  No discharge. Cardiovascular: No JVD. Respiratory: Normal effort.  No stridor.  GI: Non-distended. Skin: Warm and dry.  Intact. Psych: Normal mood.  Normal behavior. Musc: Bilateral lower extremity edema. Neuro:  Alert Makes good eye contact with examiner.   Left facial droop.   Dysarthria, unchanged Motor: Bilateral upper extremities: 4+/5 proximal distal Left lower extremity: 3 -/5 hip flexion, knee extension, 4/5 ankle dorsiflexion Right lower extremity: Hip flexion, knee extension 3-/5, ankle dorsiflexion 4/5  Assessment/Plan: 1. 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  Physiatrist is providing close team supervision and 24 hour management of active medical problems listed below.  Physiatrist and rehab team  continue to assess barriers to discharge/monitor patient progress toward functional and medical goals  Care Tool:  Bathing  Bathing activity did not occur:  (N/A) Body parts bathed by patient: Right arm, Face   Body parts bathed by helper: Left arm, Front perineal area, Buttocks     Bathing assist Assist Level: 2 Helpers     Upper Body Dressing/Undressing Upper body dressing Upper body dressing/undressing activity did not occur (including orthotics): N/A What is the patient wearing?: Pull over shirt    Upper body assist Assist Level: Total Assistance - Patient < 25%    Lower Body Dressing/Undressing Lower body dressing    Lower body dressing activity did not occur: N/A What is the patient wearing?: Incontinence brief     Lower body assist Assist for lower body dressing: 2 Helpers     Toileting Toileting Toileting Activity did not occur (Clothing management and hygiene only): N/A (no void or bm)  Toileting assist Assist for toileting: 2 Helpers     Transfers Chair/bed transfer  Transfers assist  Chair/bed transfer activity did not occur: N/A  Chair/bed transfer assist level: 2 Helpers     Locomotion Ambulation   Ambulation assist   Ambulation activity did not occur: Safety/medical concerns (decreased strength/activity tolerance)          Walk 10 feet activity   Assist  Walk 10 feet activity did not occur: Safety/medical concerns (decreased strength activity tolerance)        Walk 50 feet activity  Assist Walk 50 feet with 2 turns activity did not occur: Safety/medical concerns (decreased strength activity tolerance)         Walk 150 feet activity   Assist Walk 150 feet activity did not occur: Safety/medical concerns         Walk 10 feet on uneven surface  activity   Assist Walk 10 feet on uneven surfaces activity did not occur: Safety/medical concerns         Wheelchair     Assist   Type of Wheelchair: Manual     Wheelchair assist level: Maximal Assistance - Patient 25 - 49% Max wheelchair distance: 150 ft (15 ft supervision + 45 ft min A, remainder dependent)    Wheelchair 50 feet with 2 turns activity    Assist        Assist Level: Minimal Assistance - Patient > 75%   Wheelchair 150 feet activity     Assist     Assist Level: Maximal Assistance - Patient 25 - 49%      Medical Problem List and Plan: 1.  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  Status post TPA.  Status post loop recorder placement  Continue CIR  Team conference today to discuss current and goals and coordination of care, home and environmental barriers, and discharge planning with nursing, case manager, and therapies.  2.  Antithrombotics: -DVT/anticoagulation: Lovenox.  Vascular study negative             -antiplatelet therapy: Aspirin 81 mg daily and Plavix 85 mg daily x3 weeks then Plavix alone 3. Pain Management: Oxycodone 15 mg every 4 hours as needed chronic pain  Controlled with meds on 7/14  Monitor with increased exertion 4. Mood: Melatonin 3 mg nightly, Remeron 7.5 mg nightly as needed,  BuSpar 30 mg twice daily             -antipsychotic agents: N/A 5. Neuropsych: This patient is ?fully capable of making decisions on her own behalf. 6. Skin/Wound Care: Routine skin checks 7. Fluids/Electrolytes/Nutrition: Routine in and outs  Reordered daily multivitamin she takes at home 8.  Hypertension.  Monitor with increased mobility  Lisinopril 10, amlodipine 10 PTA   Lisinopril 5 started on 7/12   Controlled/soft on 7/14, will consider decreasing medications if persistent  Monitor with increased mobility 9.  Hyperlipidemia.    Continue Lipitor 10.  Obesity.  BMI 35.56.  Follow-up dietary 11.  OSA.  Patient on CPAP at home. 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  Follow-up outpatient cardiology service 13. Dementia: Namenda 28  mg daily, Aricept 10 mg nightly. 14.  AKI  Creatinine 1.11 on 7/13  Encourage fluids 15.  Mild hypoalbuminemia  Supplement initiated on 7/13  16. Constipation  Bowel meds increased on 7/15  LOS: 2 days A FACE TO FACE EVALUATION WAS PERFORMED  Geralda Baumgardner [name] 02/23/2020, 8:25 AM

## 2020-02-23 NOTE — Progress Notes (Signed)
Occupational Therapy Session Note  Patient Details  Name: [name] MRN: [id number] Date of Birth: 17-Mar-1941  Today's Date: 02/23/2020 OT Individual Time: 0822-0902 OT Individual Time Calculation (min): 40 min    Short Term Goals: Week 1:  OT Short Term Goal 1 (Week 1): Pt will transfer to [facility] with maxA of one person with LRAD. OT Short Term Goal 2 (Week 1): Pt will don shirt with minA and moderate cognitive cues. OT Short Term Goal 3 (Week 1): Pt will dress LB with maxA with LRAD to demonstrated improved dynamic standing balance. OT Short Term Goal 4 (Week 1): Pt will attend to items located on Lt side during grooming tasks with moderate cues.  Skilled Therapeutic Interventions/Progress Updates:    Treatment session with focus on ADL retraining, Lt attenion, and functional reach to increase independence of b/d tasks. Received semi-reclined in bed reporting pain in back. Agreeable to bed level therapy with encouragement due to fatigue and pain levels. Donned and doffed socks with maxA with HOB elevated due to decreased functional reach, decreased grip strength, motor planning, and focused attention. Pt requiring extended time to complete task due to endurance deficit. Completed oral care seated in bed with ADL items and OTS on L visual field, with pt requiring cues to attend to L visual field. Facilitated visual scanning to the left during task. Pt with decreased focused attention and motor planning when opening containers, and requiring max multimodal cues to sequence ADL task. Pt agreeable to sit up EOB in future sessions. Ended session with pt semi-reclined in bed with all needs within reach and bed alarm on.  Therapy Documentation Precautions:  Precautions Precautions: Fall Restrictions Weight Bearing Restrictions: No General:   Vital Signs: Therapy Vitals Temp: 98.1 F (36.7 C) Temp Source: Oral Pulse Rate: (!) 52 Resp: 16 BP: (!) 105/51 Patient Position (if  appropriate): Lying Oxygen Therapy SpO2: 92 % O2 Device: Room Air Pain:  Unrated pain in back, repositioned and facilitated rest breaks, notified RN   Therapy/Group: Individual Therapy  Blair Heys 02/23/2020, 7:15 AM

## 2020-02-23 NOTE — Progress Notes (Signed)
Patient rested well all night. No c/o pain or any discomfort. Patient not able to urinate for more than 8 hours, this nurse offer and assist patient with female urinal to see if patient can start to stream. Patient stated "I feel like I need to go, but it won't come out." This nurse bladder scan patient with 258 mL volume in the bladder. After scanned, this nurse performed straight cath via sterile technique. Urine output is 300 mL, yellow, clear, and no odor. Patient tolerated it well, no other concerns voiced.

## 2020-02-23 NOTE — Plan of Care (Signed)
  Problem: RH SAFETY Goal: RH STG ADHERE TO SAFETY PRECAUTIONS W/ASSISTANCE/DEVICE Description: STG Adhere to Safety Precautions With supervision  Outcome: Progressing Goal: RH STG DECREASED RISK OF FALL WITH ASSISTANCE Description: STG Decreased Risk of Fall With mod I. Outcome: Progressing   Problem: RH PAIN MANAGEMENT Goal: RH STG PAIN MANAGED AT OR BELOW PT'S PAIN GOAL Description: At or below level 4 Outcome: Progressing

## 2020-02-23 NOTE — Progress Notes (Signed)
Occupational Therapy Note  Patient Details  Name: [name] MRN: [id number] Date of Birth: 1941-03-19   Pt's plan of care adjusted to 15/7 after speaking with care team and discussed with MD in team conference as pt currently unable to tolerate current therapy schedule with OT, PT, and SLP.    Rosalio Loud 02/23/2020, 2:16 PM

## 2020-02-23 NOTE — Patient Care Conference (Signed)
Inpatient RehabilitationTeam Conference and Plan of Care Update Date: 02/23/2020   Time: 2:50 PM    Patient Name: [name]      Medical Record Number: [id number]  Date of Birth: Sep 13, 1940 Sex: Female         Room/Bed: 4W03C/4W03C-01 Payor Info: Payor: MEDICARE / Plan: MEDICARE PART A AND B / Product Type: *No Product type* /    Admit Date/Time:  02/21/2020  3:52 PM  Primary Diagnosis:  Stroke [facility])  Hospital Problems: Principal Problem:   Stroke (HCC) L>R pontine and L cerebellar embolic infarcts, source unknown s/p tPA Active Problems:   Left pontine cerebrovascular accident (HCC)   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 Maryla Morrow Care Coodinator Present: Chana Bode, RN, BSN, CRRN;Christina Stowell, BSW Nurse Present: Harle Battiest, RN PT Present: Grier Rocher, PT OT Present: Rosalio Loud, OT SLP Present: Feliberto Gottron, SLP PPS Coordinator present : Fae Pippin, SLP     Current Status/Progress Goal Weekly Team Focus  Bowel/Bladder   Patient is incotinent of bladder. Patient has no BM output record since admission.  Obtain continence of bowel and bladder  q2-3 hours toileting  nad PRN to decrease incontinence   Swallow/Nutrition/ Hydration   dys 2 textures and thin liquids, intermittent supervision A/tray set up, may need full supervision if fatigued  Mod I  swallow strategies, and trials of dys 3   ADL's   maxA +2 lateral scoot or Stedy, UB ADLs mod w max cog cues, LB ADLs total +2  minA mobility & ADLs, w/ sup groom & sit  ADL retraining, sitting and standing balance, functional transfers, Lt attention   Mobility   mod A bed mobility, max A +2 transfers via squat pivot, min A w/c mobility  min A  balance, activity tolerance   Communication              Safety/Cognition/ Behavioral Observations  Max A delayed recall, Mod A basic problem solving, error awareness, and sustained attention  Mod I - basic  basic problem solvng, emergent awareness, delayed recall with external aid and sustained attention   Pain   Patient reported no pain or discomfort  maintain pain free with or without activity  Q shift and PRN pain assessment   Skin   patient has some bruising in the arms  maintain good skin condition and prevent further skin breakdown  q shift and PRN skin assessment.     Team Discussion:  Discharge Planning/Teaching Needs:         Current Update:    Current Barriers to Discharge:  Incontinence and pain, weakness  Possible Resolutions to Barriers: Timed Toileting Intermittent catheterizations   Patient on target to meet rehab goals: yes, progress limited by pain and fatigue; minimal participation in therapy and visual deficits impair progress  *See Care Plan and progress notes for long and short-term goals.   Revisions to Treatment Plan:  Changed to 15/7 therapy schedule    Medical Summary Current Status: Left facial droop slurred speech right side weakness secondary to left greater than right pontine and left cerebellar infarct.  Status post TPA.  Status post loop recorder placement Weekly Focus/Goal: Improve mobility, AKI, BP, chronic pain, constipation  Barriers to Discharge: Medical stability   Possible Resolutions to Barriers: Therapies, encourage fluids, follow up labs, optimize BP meds, bowel  meds   Continued Need for Acute Rehabilitation Level of Care: The patient requires daily medical management by a physician with specialized training in physical medicine and rehabilitation for the following reasons: Direction of a multidisciplinary physical rehabilitation program to maximize functional independence : Yes Medical management of patient stability for increased activity during participation in an intensive  rehabilitation regime.: Yes Analysis of laboratory values and/or radiology reports with any subsequent need for medication adjustment and/or medical intervention. : Yes   I attest that I was present, lead the team conference, and concur with the assessment and plan of the team.   Chana Bode B 02/23/2020, 2:50 PM

## 2020-02-23 NOTE — Progress Notes (Signed)
Inpatient Rehabilitation Center Individual Statement of Services  Patient Name:  [name]  Date:  02/23/2020  Welcome to the Inpatient Rehabilitation Center.  Our goal is to provide you with an individualized program based on your diagnosis and situation, designed to meet your specific needs.  With this comprehensive rehabilitation program, you will be expected to participate in at least 3 hours of rehabilitation therapies Monday-Friday, with modified therapy programming on the weekends.  Your rehabilitation program will include the following services:  Physical Therapy (PT), Occupational Therapy (OT), Speech Therapy (ST), 24 hour per day rehabilitation nursing, Therapeutic Recreaction (TR), Neuropsychology, Care Coordinator, Rehabilitation Medicine, Nutrition Services, Pharmacy Services and Other  Weekly team conferences will be held on Wednesdays to discuss your progress.  Your Inpatient Rehabilitation Care Coordinator will talk with you frequently to get your input and to update you on team discussions.  Team conferences with you and your family in attendance may also be held.  Expected length of stay: 22 to 25 Days   Overall anticipated outcome: Min A to Supervision  Depending on your progress and recovery, your program may change. Your Inpatient Rehabilitation Care Coordinator will coordinate services and will keep you informed of any changes. Your Inpatient Rehabilitation Care Coordinator's name and contact numbers are listed  below.  The following services may also be recommended but are not provided by the Inpatient Rehabilitation Center:    Home Health Rehabiltiation Services  Outpatient Rehabilitation Services    Arrangements will be made to provide these services after discharge if needed.  Arrangements include referral to agencies that provide these services.  Your insurance has been verified to be:  Medicare Your primary doctor is:  [name], MD  Pertinent  information will be shared with your doctor and your insurance company.  Inpatient Rehabilitation Care Coordinator:  Lavera Guise, [place] [phone] or [phone]  Information discussed with and copy given to patient by: Andria Rhein, 02/23/2020, 12:07 PM

## 2020-02-23 NOTE — Progress Notes (Signed)
Speech Language Pathology Daily Session Note  Patient Details  Name: [name] MRN: [id number] Date of Birth: [birth year]  Today's Date: 02/23/2020 SLP Individual Time: 1030-1055 SLP Individual Time Calculation (min): 25 min  Short Term Goals: Week 1: SLP Short Term Goal 1 (Week 1): Pt will complete familar and functional problem solving tasks with min A verbal cues. SLP Short Term Goal 2 (Week 1): Pt will self-monitor and self-correct errors in functional tasks with min A verbal cues. SLP Short Term Goal 3 (Week 1): Pt will demonstrate sustained attention in 10 minute intervals with min A verbal cues for redirection during functional tasks. SLP Short Term Goal 4 (Week 1): Pt will utilizie external aids to recall novel and daily information with mod A verbal cues. SLP Short Term Goal 5 (Week 1): Pt will consume dys 2 textures and thin liquid diet with mod I use of swallow strategies. SLP Short Term Goal 6 (Week 1): Pt will consume trials of dys 3 textures over x3 sessions with apporpriate oral clearance and no overt s/s aspiration with supervision A verbal cues.  Skilled Therapeutic Interventions: Skilled treatment session focused on cognitive goals. SLP facilitated session by providing a modified schedule that was larger and bolder in color. Patient was able to recall the previous therapy session and use the schedule to anticipate upcoming sessions with extra time. Patient also given a calendar that she could read and utilize with Mod I. Patient began to moan in pain at end of session due to back pain. Patient was repositioned and RN made aware. Patient left supine in bed with alarm on and all needs within reach. Continue with current plan of care.   Pain Pain Assessment Pain Score: 7   Therapy/Group: Individual Therapy  Cadan Maggart 02/23/2020, 3:25 PM

## 2020-02-23 NOTE — Progress Notes (Signed)
Physical Therapy Session Note  Patient Details  Name: [name] MRN: [id number] Date of Birth: 12/02/1940  Today's Date: 02/23/2020 PT Individual Time: 1307-1400 AND 1530-1630 PT Individual Time Calculation (min): 53 min and 60 min  Short Term Goals: Week 1:  PT Short Term Goal 1 (Week 1): pt to perform bed mobility at min A x1 PT Short Term Goal 2 (Week 1): pt to perform STS transfers and stand pivot transfers at mod A x2 PT Short Term Goal 3 (Week 1): pt to initiate gait training PT Short Term Goal 4 (Week 1): pt to perform manual WC propulsion at mod A x1 for 150'  Skilled Therapeutic Interventions/Progress Updates:   Pt received supine in bed and agreeable to PT. Supine>sit transfer with CGA and increased time cues for assist and cues for use of UE to  Perform reciprocal scooting to EOB. Noted ataxia in RUE>LUE with scooting.   Sit<>stand at EOB with BUE on therapist shoulders with mod assist. Static standing x 30 sec with no severe knee instability. Returned to sitting EOB with supervision assist . Stand pivot transfer with R knee blocked in stance with mod-max assist and no AD. UE support on PT and arm rest of chair. Pt reports incontinent bladder void. Stand pivot transfer to toilet to doff soiled brief and performed pericare at [facility] over toliet. Stand pivot transfer with mod assist +2 for safety with 1 UE supported on rail, 1 on PT shoiulder and R knee blocked in stance.   Pt transported to rehab gym in [facility]. Sit<>stand in parallel bars with min-mod assist and BUE supported on rails. Forward/reverse gait 2x 3 ft with mod assist block BLE in stanice due to Bil ataxia.   Patient returned to room and left sitting in [facility] with call bell in reach and all needs met.      Session 2.  Pt received sitting in WC and agreeable to PT. Pt transported to rehab gym in [facility]. Sit<>stand in parallel bars x 4 with static standing balance with min assist and min verbal-tactile cues to improve upright  posture and to weight shifting L.   Gait training in parallel bars 61f +646fwith mod assist and WC follow. Bil knee blocked in stance phase to prevent buckling and improve stability with ataxia in BLE.   Sit<>stand with RW x 5 with mod assist overall from PT. Max cues for proper UE placement to push from arm rest and then reach back to sit. Static standing x 10 sec following each transfer. With verbal instruction to maintain knee extension and midline.   Kinetron reciprocal movement training 2 x 2 min with cues for full ROM and symmetry throughout with prolonged rest break between bouts. Pt returned to room and performed squat pivot transfer to bed with max Assist. Sit>supine completed with mod assist for BLE management, and left supine in bed with call bell in reach and all needs met.          Therapy Documentation Precautions:  Precautions Precautions: Fall Restrictions Weight Bearing Restrictions: No    Vital Signs: Therapy Vitals Temp: 99.2 F (37.3 C) Pulse Rate: 66 Resp: 16 BP: 109/68 Patient Position (if appropriate): Sitting Oxygen Therapy SpO2: 93 % O2 Device: Room Air Pain: Pain Assessment Pain Score: 7  back. RN notified.   Therapy/Group: Individual Therapy  AuLorie Phenix/14/2021, 3:47 PM

## 2020-02-23 NOTE — Progress Notes (Signed)
Inpatient Rehabilitation Care Coordinator Assessment and Plan  Patient Details  Name: [name] MRN: [id number] Date of Birth: Mar 24, 1941  Today's Date: 02/23/2020  Problem List:  Patient Active Problem List   Diagnosis Date Noted  . Benign essential HTN   . Slow transit constipation   . 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 Allergic reaction caused by a drug (tPA w/ lip swelling) 02/21/2020  . 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 Dementia (HCC) 02/21/2020  . Bradycardia 02/21/2020  . AKI (acute kidney injury) (HCC) 02/21/2020  . 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 Dyslipidemia   . History of DVT (deep vein thrombosis)   . OSA (obstructive sleep apnea)   . Leukocytosis   . Stroke [facility]) L>R pontine and L cerebellar embolic infarcts, source unknown s/p tPA 02/16/2020  . Hx-TIA (transient ischemic attack)   . Hyperlipemia   . Hypertension   . Sleep apnea   . History of pulmonary embolus (PE)   . NSTEMI (non-ST elevated myocardial infarction) (HCC) 09/16/2019  . 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 Depression 07/29/2018  . Acute pulmonary embolism (HCC) 09/29/2016  . Acute encephalopathy 09/28/2016  . Left knee pain 09/28/2016  . Left leg swelling 09/28/2016  . Hilar mass 09/28/2016  . OSA on CPAP 09/28/2016  . HTN (hypertension) 09/28/2016  . HLD (hyperlipidemia) 09/28/2016  . Chronic pain 09/28/2016  . Cellulitis of left leg 06/14/2016  . Osteoarthritis of left knee 05/17/2016  . 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 Arthritis   . Asthma   . Chronic back pain   . Depression   . History of pulmonary embolus (PE)   . Hx-TIA (transient ischemic attack)   . Hyperlipemia   . Hypertension   . Personal history of DVT (deep vein thrombosis)   . Scoliosis   . Sleep apnea    cpap - settingsi at 3   . Takotsubo cardiomyopathy  09/16/2019   Past Surgical History:  Past Surgical History:  Procedure Laterality Date  . ARTHROSCOPIC REPAIR ACL  02/03/2004  . LEFT HEART CATH AND CORONARY ANGIOGRAPHY N/A 09/16/2019   Procedure: LEFT HEART CATH AND CORONARY ANGIOGRAPHY;  Surgeon: Yvonne Kendall, MD;  Location: [facility] INVASIVE CV LAB;  Service: Cardiovascular;  Laterality: N/A;  . LOOP RECORDER INSERTION N/A 02/21/2020   Procedure: LOOP RECORDER INSERTION;  Surgeon: Duke Salvia, MD;  Location: [facility] INVASIVE CV LAB;  Service: Cardiovascular;  Laterality: N/A;  . OOPHORECTOMY  1970  . SPINE SURGERY  03/31/2006  . TOTAL KNEE ARTHROPLASTY Left 05/17/2016   Procedure: LEFT TOTAL KNEE ARTHROPLASTY;  Surgeon: Kathryne Hitch, MD;  Location: [facility] ORS;  Service: Orthopedics;  Laterality: Left;  Adductor Block; Spinal to General  . TUBAL LIGATION  1983   Social History:  reports that she quit smoking about 46 years ago. Her smoking use included cigarettes. She has a 10.00 pack-year smoking history. She has never used smokeless tobacco. She reports that she does not drink alcohol and does not use drugs.  Family / Support Systems Children: [name] ( son) Other Supports: granddaughter and daughter in Social worker Anticipated Caregiver: Eber Jones and son Ability/Limitations of Caregiver: none Caregiver Availability: 24/7  Social History Preferred language: English Religion: Baptist     Abuse/Neglect Abuse/Neglect Assessment Can Be Completed: Yes Physical Abuse: Denies Verbal Abuse: Denies Sexual Abuse: Denies Exploitation of patient/patient's resources: Denies Self-Neglect: Denies  Emotional Status Pt's affect, behavior and adjustment status: no Recent Psychosocial Issues: no Psychiatric History: no Substance Abuse History: no  Patient / Family Perceptions, Expectations & Goals Pt/Family understanding of illness & functional limitations: yes Pt/family expectations/goals: Goal to discharge back home (granddaughter lives with  patient able to provide 24 hr care)  [employer]: None Premorbid Home Care/DME Agencies: None Transportation available at discharge: family able to transport  Discharge Planning Living Arrangements: Other relatives, Children Support Systems: Children, Other relatives Type of Residence: Private residence (1 level home. 6 steps to enter front door, L side railings) Insurance Resources: Medicare Does the patient have any problems obtaining your medications?: No Care Coordinator Anticipated Follow Up Needs: HH/OP Expected length of stay: 22-25 Days  Clinical Impression SW entered room introduced self, explained role and process. Sw will continue to monitor patient and family for questions and concerns.  Andria Rhein 02/23/2020, 12:16 PM

## 2020-02-23 NOTE — Progress Notes (Signed)
Patient ID: [name], female   DOB: 09/21/1940, 78 y.o.   MRN: [id number] Team Conference Report to Patient/Family  Team Conference discussion was reviewed with the patient and caregiver, including goals, any changes in plan of care and target discharge date.  Patient and caregiver express understanding and are in agreement.  The patient has a target discharge date of 03/16/20.  Sahmir Weatherbee J Bennetta Rudden 02/23/2020, 1:59 PM  

## 2020-02-24 ENCOUNTER — Inpatient Hospital Stay (HOSPITAL_COMMUNITY): Payer: Medicare Other

## 2020-02-24 ENCOUNTER — Inpatient Hospital Stay (HOSPITAL_COMMUNITY): Payer: Medicare Other | Admitting: Speech Pathology

## 2020-02-24 DIAGNOSIS — I69391 Dysphagia following cerebral infarction: Secondary | ICD-10-CM

## 2020-02-24 NOTE — Plan of Care (Signed)
  Problem: RH BOWEL ELIMINATION Goal: RH STG MANAGE BOWEL WITH ASSISTANCE Description: STG Manage Bowel with Min assist Outcome: Progressing Goal: RH STG MANAGE BOWEL W/MEDICATION W/ASSISTANCE Description: STG Manage Bowel with Medication with Mod I Assistance. Outcome: Progressing   Problem: RH SAFETY Goal: RH STG ADHERE TO SAFETY PRECAUTIONS W/ASSISTANCE/DEVICE Description: STG Adhere to Safety Precautions With supervision  Outcome: Progressing Goal: RH STG DECREASED RISK OF FALL WITH ASSISTANCE Description: STG Decreased Risk of Fall With mod I. Outcome: Progressing   Problem: RH PAIN MANAGEMENT Goal: RH STG PAIN MANAGED AT OR BELOW PT'S PAIN GOAL Description: At or below level 4 Outcome: Progressing

## 2020-02-24 NOTE — Progress Notes (Signed)
[place] PHYSICAL MEDICINE & REHABILITATION PROGRESS NOTE  Subjective/Complaints: Patient seen laying in bed this morning.  She states she slept well overnight.  She is aware of her discharge date.  ROS: Denies CP, SOB, N/V/D  Objective: Vital Signs: Blood pressure 119/61, pulse (!) 44, temperature 98.3 F (36.8 C), resp. rate 16, height 5\' 2"  (1.575 m), weight 83.8 kg, SpO2 92 %. No results found. Recent Labs    02/22/20 0545  WBC 8.1  HGB 12.1  HCT 37.4  PLT 168   Recent Labs    02/22/20 0545  NA 140  K 3.6  CL 102  CO2 29  GLUCOSE 106*  BUN 27*  CREATININE 1.11*  CALCIUM 9.0    Physical Exam: BP 119/61 (BP Location: Left Arm)   Pulse (!) 44   Temp 98.3 F (36.8 C)   Resp 16   Ht 5\' 2"  (1.575 m)   Wt 83.8 kg   SpO2 92%   BMI 33.79 kg/m  Constitutional: No distress . Vital signs reviewed. HENT: Normocephalic.  Atraumatic. Eyes: Limitations in lateral gaze.  No discharge. Cardiovascular: No JVD. Respiratory: Normal effort.  No stridor. GI: Non-distended. Skin: Warm and dry.  Intact. Psych: Flat. Musc: Bilateral lower extremity edema.  No tenderness. Neuro:  Alert Makes good eye contact with examiner.   Left cranial nerve VII palsy Dysarthria, stable Motor: Bilateral upper extremities: 4+/5 proximal distal Left lower extremity: 3 -/5 hip flexion, knee extension, 4/5 ankle dorsiflexion Right lower extremity: Hip flexion, knee extension 4-/5, ankle dorsiflexion 4/5  Assessment/Plan: 1. 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  Physiatrist is providing close team supervision and 24 hour management of active medical problems listed below.  Physiatrist and rehab team continue to assess barriers to discharge/monitor patient progress toward functional and medical goals  Care Tool:  Bathing  Bathing activity did not  occur:  (N/A) Body parts bathed by patient: Right arm, Face   Body parts bathed by helper: Left arm, Front perineal area, Buttocks     Bathing assist Assist Level: 2 Helpers     Upper Body Dressing/Undressing Upper body dressing   What is the patient wearing?: Pull over shirt    Upper body assist Assist Level: Total Assistance - Patient < 25%    Lower Body Dressing/Undressing Lower body dressing      What is the patient wearing?: Incontinence brief     Lower body assist Assist for lower body dressing: 2 Helpers     Toileting Toileting Toileting Activity did not occur (Clothing management and hygiene only): N/A (no void or bm)  Toileting assist Assist for toileting: 2 Helpers     Transfers Chair/bed transfer  Transfers assist     Chair/bed transfer assist level: 2 Helpers     Locomotion Ambulation   Ambulation assist   Ambulation activity did not occur: Safety/medical concerns (decreased strength/activity tolerance)  Assist level: Moderate Assistance - Patient 50 - 74%       Walk 10 feet activity   Assist  Walk 10 feet activity did not occur: Safety/medical concerns (decreased strength activity tolerance)        Walk 50 feet activity   Assist Walk 50 feet with 2 turns activity did not occur: Safety/medical concerns (decreased strength activity tolerance)         Walk 150 feet activity   Assist Walk 150 feet activity did not  occur: Safety/medical concerns         Walk 10 feet on uneven surface  activity   Assist Walk 10 feet on uneven surfaces activity did not occur: Safety/medical concerns         Wheelchair     Assist Will patient use wheelchair at discharge?: Yes (Per PT long term goals ) Type of Wheelchair: Manual    Wheelchair assist level: Maximal Assistance - Patient 25 - 49% Max wheelchair distance: 150 ft (15 ft supervision + 45 ft min A, remainder dependent)    Wheelchair 50 feet with 2 turns  activity    Assist        Assist Level: Minimal Assistance - Patient > 75%   Wheelchair 150 feet activity     Assist     Assist Level: Maximal Assistance - Patient 25 - 49%      Medical Problem List and Plan: 1.  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  Status post TPA.  Status post loop recorder placement  Continue CIR  Continue ophthalmic solution/ointments 2.  Antithrombotics: -DVT/anticoagulation: Lovenox.  Vascular study negative             -antiplatelet therapy: Aspirin 81 mg daily and Plavix 85 mg daily x3 weeks then Plavix alone 3. Pain Management: Oxycodone 15 mg every 4 hours as needed chronic pain  Controlled with meds on 7/15  Monitor with increased exertion 4. Mood: Melatonin 3 mg nightly, Remeron 7.5 mg nightly as needed,  BuSpar 30 mg twice daily             -antipsychotic agents: N/A 5. Neuropsych: This patient is ?fully capable of making decisions on her own behalf. 6. Skin/Wound Care: Routine skin checks 7. Fluids/Electrolytes/Nutrition: Routine in and outs  Reordered daily multivitamin she takes at home 8.  Hypertension.  Monitor with increased mobility  Lisinopril 10, amlodipine 10 PTA   Lisinopril 5 started on 7/12   Controlled on 7/15  Monitor with increased mobility 9.  Hyperlipidemia.    Continue Lipitor 10.  Obesity.  BMI 35.56.  Follow-up dietary 11.  OSA.  Patient on CPAP at home. 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  Follow-up outpatient cardiology service 13. Dementia: Namenda 28 mg daily, Aricept 10 mg nightly. 14.  AKI  Creatinine 1.11 on 7/13, labs ordered for tomorrow  Encourage fluids 15.  Mild hypoalbuminemia  Supplement initiated on 7/13  16. Constipation  Bowel meds increased on 7/15 17.  Post stroke dysphagia  D2 thins, advance as tolerated  LOS: 3 days A FACE TO FACE EVALUATION WAS PERFORMED  Raymundo Rout [name] 02/24/2020, 8:24  AM

## 2020-02-24 NOTE — Progress Notes (Signed)
Speech Language Pathology Daily Session Note  Patient Details  Name: [name] MRN: [id number] Date of Birth: 11-15-1940  Today's Date: 02/24/2020 SLP Individual Time: 8295-6213 SLP Individual Time Calculation (min): 40 min  Short Term Goals: Week 1: SLP Short Term Goal 1 (Week 1): Pt will complete familar and functional problem solving tasks with min A verbal cues. SLP Short Term Goal 2 (Week 1): Pt will self-monitor and self-correct errors in functional tasks with min A verbal cues. SLP Short Term Goal 3 (Week 1): Pt will demonstrate sustained attention in 10 minute intervals with min A verbal cues for redirection during functional tasks. SLP Short Term Goal 4 (Week 1): Pt will utilizie external aids to recall novel and daily information with mod A verbal cues. SLP Short Term Goal 5 (Week 1): Pt will consume dys 2 textures and thin liquid diet with mod I use of swallow strategies. SLP Short Term Goal 6 (Week 1): Pt will consume trials of dys 3 textures over x3 sessions with apporpriate oral clearance and no overt s/s aspiration with supervision A verbal cues.  Skilled Therapeutic Interventions:  Skilled treatment session focused on dysphagia and cognitive goals. SLP facilitated session by providing overall supervision level verbal cues for problem solving during self-feeding with breakfast meal of Dys. 2 textures with thin liquids. Patient consumed meal without overt s/s of aspiration with minimal oral residue noted. Recommend patient continue current diet. SLP also facilitated session by introducing a memory notebook to maximize recall and carryover of functional information. Patient was able to utilize the notebook with overall Mod I. Patient left upright in the bed with alarm on and all needs within reach. Continue with current plan of care.      Pain Pain Assessment Pain Scale: 0-10 Pain Score: 8   Therapy/Group: Individual Therapy  Mikko Lewellen 02/24/2020, 10:21 AM

## 2020-02-24 NOTE — Progress Notes (Signed)
Occupational Therapy Session Note  Patient Details  Name: [name] MRN: [id number] Date of Birth: 03-Nov-1940  Today's Date: 02/24/2020 OT Individual Time: 9371-6967 OT Individual Time Calculation (min): 43 min    Short Term Goals: Week 1:  OT Short Term Goal 1 (Week 1): Pt will transfer to [facility] with maxA of one person with LRAD. OT Short Term Goal 2 (Week 1): Pt will don shirt with minA and moderate cognitive cues. OT Short Term Goal 3 (Week 1): Pt will dress LB with maxA with LRAD to demonstrated improved dynamic standing balance. OT Short Term Goal 4 (Week 1): Pt will attend to items located on Lt side during grooming tasks with moderate cues.  Skilled Therapeutic Interventions/Progress Updates:    Treatment session with focus on ADL retraining, functional transfers, and Lt attention and dynamic sitting balance to improve independence for ADL tasks. Pt received seated in w/c with noticeably brighter affect. Pt reported "I'm tired, I've done a lot today" but agreeable to therapy with encouragement. Completed oral care seated at sink with ADL items positioned to Lt to promote Lt attention, facilitating opportunities for grip strengthening. Washed face with minA for thoroughness due to pt visual deficits. Facilitated use of functional reach throughout sink level tasks by progressively grading distance from sink and ADL items. Completed Stedy transfer with modA +2 fading to modA from one helper from w/c > bed. Sat at EOB with minA for static sitting and modA for dynamic sitting, facilitating improved posture and scapular retraction through multimodal cues. Engaged in writing task to provide memory, attention, and sitting balance challenge. Pt able to recall names of loved ones. Completed sit > supine with maxA due to pt report of back pain and decreased endurance. Ended session with pt semi-reclined in bed with all needs within reach and seat alarm on.  Therapy Documentation Precautions:   Precautions Precautions: Fall Restrictions Weight Bearing Restrictions: No Pain: Pain Assessment Pain Score: 8  ADL: ADL Grooming: Moderate assistance Where Assessed-Grooming: Sitting at sink Upper Body Bathing: Moderate assistance Where Assessed-Upper Body Bathing: Sitting at sink Lower Body Bathing: Maximal assistance Where Assessed-Lower Body Bathing: Sitting at sink Upper Body Dressing: Moderate assistance Where Assessed-Upper Body Dressing: Sitting at sink Lower Body Dressing: Dependent Where Assessed-Lower Body Dressing: Other (Comment) (stedy) Toilet Transfer: Dependent (stedy) Toilet Transfer Method: Other (comment) (stedy)  Therapy/Group: Individual Therapy  Blair Heys 02/24/2020, 12:14 PM

## 2020-02-24 NOTE — Progress Notes (Signed)
Physical Therapy Session Note  Patient Details  Name: [name] MRN: [id number] Date of Birth: 03/20/1941  Today's Date: 02/24/2020 PT Individual Time: 0900-1000 PT Individual Time Calculation (min): 60 min   Short Term Goals: Week 1:  PT Short Term Goal 1 (Week 1): pt to perform bed mobility at min A x1 PT Short Term Goal 2 (Week 1): pt to perform STS transfers and stand pivot transfers at mod A x2 PT Short Term Goal 3 (Week 1): pt to initiate gait training PT Short Term Goal 4 (Week 1): pt to perform manual WC propulsion at mod A x1 for 150'  Skilled Therapeutic Interventions/Progress Updates:  Pt resting in bed.  She denied pain initally, but then stated that her L eye was sore.  L eye noted to be very erythematous; Zollie Scale, RN stated that she had already had eye meds.  Supine to sit in flat bed without rails, min assist.  Pt scooted forward on the bed by shifting side to side, with extra time.  Sit to stand from bed with min assist; pivot to w/c on R with min/mod assist.  W/c propulsion using bil UEs x 80' on level tile, supervision with VCS for steering due to veering R.  In parallel bars, sit to stand pushing up on w/c with min assist.  Neuro re-ed via multimodal cues for standing: Mini squats x 5 before fatiguing.  In sitting, 20 x 1 bil heel raise, 6 x 1 bil toe raise, 10 x 1 R/L hip flexion.  Pt required rest breaks between sets.  Use of Kinetron from w/c level for bil alternating reciprocal movement, x 25 cycles, at level 40 cm/sec.  Gait training in parallel bars, ACE on L knee due to pre-morbid pain per pt. Gait with mod assist x 8', iwht max cues for upright trunk forward gaze and progression of hands on bars.  Pt exhausted. She stated that ACE on L knee felt good.  PT donned gloves on pt and she used bil hands for fine motor activity of wiping down parallel bars from w/c level, with mod assist.  At end of session, pt in w/c with seat belt alarm set and needs at hand.      Therapy Documentation Precautions:  Precautions Precautions: Fall Restrictions Weight Bearing Restrictions: No        Therapy/Group: Individual Therapy  Aidon Klemens 02/24/2020, 10:13 AM

## 2020-02-25 ENCOUNTER — Inpatient Hospital Stay (HOSPITAL_COMMUNITY): Payer: Medicare Other | Admitting: Occupational Therapy

## 2020-02-25 ENCOUNTER — Inpatient Hospital Stay (HOSPITAL_COMMUNITY): Payer: Medicare Other

## 2020-02-25 ENCOUNTER — Inpatient Hospital Stay (HOSPITAL_COMMUNITY): Payer: Medicare Other | Admitting: Speech Pathology

## 2020-02-25 DIAGNOSIS — N182 Chronic kidney disease, stage 2 (mild): Secondary | ICD-10-CM

## 2020-02-25 LAB — BASIC METABOLIC PANEL
Anion gap: 9 (ref 5–15)
BUN: 16 mg/dL (ref 8–23)
CO2: 30 mmol/L (ref 22–32)
Calcium: 9.1 mg/dL (ref 8.9–10.3)
Chloride: 105 mmol/L (ref 98–111)
Creatinine, Ser: 0.93 mg/dL (ref 0.44–1.00)
GFR calc Af Amer: >60 mL/min (ref >60)
GFR calc non Af Amer: 58 mL/min — ABNORMAL LOW (ref >60)
Glucose, Bld: 84 mg/dL (ref 70–99)
Potassium: 3.5 mmol/L (ref 3.5–5.1)
Sodium: 144 mmol/L (ref 135–145)

## 2020-02-25 MED ORDER — LISINOPRIL 2.5 MG PO TABS
2.5000 mg | ORAL_TABLET | Freq: Every day | ORAL | Status: DC
  Administered 2020-02-26 – 2020-03-08 (×12): 2.5 mg via ORAL
  Filled 2020-02-25 (×12): qty 1

## 2020-02-25 NOTE — Progress Notes (Signed)
Occupational Therapy Session Note  Patient Details  Name: [name] MRN: [id number] Date of Birth: 11-Mar-1941  Today's Date: 02/25/2020 OT Individual Time: 0939-1010 OT Individual Time Calculation (min): 31 min    Short Term Goals: Week 1:  OT Short Term Goal 1 (Week 1): Pt will transfer to [facility] with maxA of one person with LRAD. OT Short Term Goal 2 (Week 1): Pt will don shirt with minA and moderate cognitive cues. OT Short Term Goal 3 (Week 1): Pt will dress LB with maxA with LRAD to demonstrated improved dynamic standing balance. OT Short Term Goal 4 (Week 1): Pt will attend to items located on Lt side during grooming tasks with moderate cues.  Skilled Therapeutic Interventions/Progress Updates:    Treatment session with focus ADL retraining and functional transfers. Pt received semi-reclined in bed reporting no pain. Completed Stedy transfer bed > w/c with modA due to R lateral lean. Facilitated set up of ADL items on Lt to promote Lt attention and midline crossing to increase Lt attention. Engaged in UB bathing with supervision seated at sink. Doffed and donned shirt with minA to pull shirt over head with max cues for motor planning and organization. Doffed and donned pants without Stedy at sit to stand level at sink with modA +2 for dynamic standing balance, fading to modA d/t pt improved standing balance. Provided maxA to pull pants over waist and assist with threading BLE through pants due to decreased functional reach. Pt with improved standing balance compared to previous sessions, would benefit from continued dynamic standing balance interventions with balance support of stable surface. Ended session with pt semi-reclined in bed with all needs within reach and bed alarm on.  Therapy Documentation Precautions:  Precautions Precautions: Fall Restrictions Weight Bearing Restrictions: No General:   Vital Signs: Therapy Vitals Temp: 98.5 F (36.9 C) Pulse Rate: (!)  43 Resp: 16 BP: (!) 115/53 Patient Position (if appropriate): Lying Oxygen Therapy SpO2: 94 % O2 Device: Room Air Pain:    Therapy/Group: Individual Therapy  Blair Heys 02/25/2020, 7:21 AM

## 2020-02-25 NOTE — Progress Notes (Signed)
Physical Therapy Session Note  Patient Details  Name: [name] MRN: [id number] Date of Birth: 01/24/1941  Today's Date: 02/25/2020 PT Individual Time: 3614-4315 PT Individual Time Calculation (min): 45 min  Missed Time: 30 min (fatigue)  Short Term Goals: Week 1:  PT Short Term Goal 1 (Week 1): pt to perform bed mobility at min A x1 PT Short Term Goal 2 (Week 1): pt to perform STS transfers and stand pivot transfers at mod A x2 PT Short Term Goal 3 (Week 1): pt to initiate gait training PT Short Term Goal 4 (Week 1): pt to perform manual WC propulsion at mod A x1 for 150'  Skilled Therapeutic Interventions/Progress Updates:     Pt supine in bed and agreeable to therapy. No report of pain. Supine > sit with minA and use of bedrails. Pt performs squat pivot from bed to [facility] with modA and cues for technique, positioning, and sequencing for safety. WC transport to gym for time management. Pt performs multiple bouts of sit to stand in // bars with minA progressing to CGA. Once standing, pt ambulates max of 8', taking forward and backward steps, with minA at hips for stability and to facilitate weight shifting. Pt somewhat collapses into RLE with slight knee buckling during stance phase. Pt also fatigues very quickly, requiring frequent and extended seated rest breaks and pt requesting to return to bed prior to end of session. WC>bed transfer with min/modA and return to supine with supervision. Pt misses 30 minutes of therapy due to fatigue.  Therapy Documentation Precautions:  Precautions Precautions: Fall Restrictions Weight Bearing Restrictions: No   Therapy/Group: Individual Therapy  Beau Fanny, PT, DPT 02/25/2020, 5:36 PM

## 2020-02-25 NOTE — Progress Notes (Signed)
Speech Language Pathology Daily Session Note  Patient Details  Name: [name] MRN: [id number] Date of Birth: 1941/04/24  Today's Date: 02/25/2020 SLP Individual Time: 1430-1455 SLP Individual Time Calculation (min): 25 min  Short Term Goals: Week 1: SLP Short Term Goal 1 (Week 1): Pt will complete familar and functional problem solving tasks with min A verbal cues. SLP Short Term Goal 2 (Week 1): Pt will self-monitor and self-correct errors in functional tasks with min A verbal cues. SLP Short Term Goal 3 (Week 1): Pt will demonstrate sustained attention in 10 minute intervals with min A verbal cues for redirection during functional tasks. SLP Short Term Goal 4 (Week 1): Pt will utilizie external aids to recall novel and daily information with mod A verbal cues. SLP Short Term Goal 5 (Week 1): Pt will consume dys 2 textures and thin liquid diet with mod I use of swallow strategies. SLP Short Term Goal 6 (Week 1): Pt will consume trials of dys 3 textures over x3 sessions with apporpriate oral clearance and no overt s/s aspiration with supervision A verbal cues.  Skilled Therapeutic Interventions: Skilled treatment session focused on cognitive goals. SLP facilitated session by providing extra time and supervision visual cues for patient to utilize external aids for orientation and for recall of previous events. Patient requested to use the bedpan and was continent of urine. SLP also facilitated session by providing education regarding speech intelligibility strategies. Patient verbalized understanding and required Min verbal cues for utilization at the sentence level. Patient left upright in bed with alarm on and all needs within reach. Continue with current plan of care.       Pain No/Denies Pain   Therapy/Group: Individual Therapy  Jeanann Balinski 02/25/2020, 4:02 PM

## 2020-02-25 NOTE — Progress Notes (Signed)
A&Ox4. Calm and cooperative with care. No c/o pain or discomfort this shift. Eye drops given as ordered- tolerated well. Tolerating diet and liquids well. Participated in all therapies this shift.

## 2020-02-25 NOTE — Progress Notes (Signed)
[place] PHYSICAL MEDICINE & REHABILITATION PROGRESS NOTE  Subjective/Complaints: Patient seen sitting up in bed this AM.  She states she slept well overnight.  She feels she is getting stronger.    ROS: Denies CP, SOB, N/V/D  Objective: Vital Signs: Blood pressure (!) 115/53, pulse (!) 43, temperature 98.5 F (36.9 C), resp. rate 16, height 5\' 2"  (1.575 m), weight 83.8 kg, SpO2 94 %. No results found. No results for input(s): WBC, HGB, HCT, PLT in the last 72 hours. Recent Labs    02/25/20 0600  NA 144  K 3.5  CL 105  CO2 30  GLUCOSE 84  BUN 16  CREATININE 0.93  CALCIUM 9.1    Physical Exam: BP (!) 115/53 (BP Location: Left Arm)   Pulse (!) 43   Temp 98.5 F (36.9 C)   Resp 16   Ht 5\' 2"  (1.575 m)   Wt 83.8 kg   SpO2 94%   BMI 33.79 kg/m  Constitutional: No distress . Vital signs reviewed. HENT: Normocephalic.  Atraumatic. Eyes: Limitations in light>right gaze. No discharge. Cardiovascular: No JVD. Respiratory: Normal effort.  No stridor. GI: Non-distended. Skin: Warm and dry.  Intact. Psych: Flat. Musc: Bilateral lower extremity edema No edema in extremities.  No tenderness in extremities. Neuro:  Alert Makes good eye contact with examiner.   Left cranial nerve VII palsy Dysarthria, unchanged Motor: Bilateral upper extremities: 4+/5 proximal distal Left lower extremity: 4-/5 hip flexion, knee extension, 4/5 ankle dorsiflexion Right lower extremity: Hip flexion, knee extension 4/5, ankle dorsiflexion 4/5  Assessment/Plan: 1. 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  Physiatrist is providing close team supervision and 24 hour management of active medical problems listed below.  Physiatrist and rehab team continue to assess barriers to discharge/monitor patient progress toward functional and medical goals  Care  Tool:  Bathing  Bathing activity did not occur:  (N/A) Body parts bathed by patient: Right arm, Face   Body parts bathed by helper: Left arm, Front perineal area, Buttocks     Bathing assist Assist Level: 2 Helpers     Upper Body Dressing/Undressing Upper body dressing   What is the patient wearing?: Pull over shirt    Upper body assist Assist Level: Total Assistance - Patient < 25%    Lower Body Dressing/Undressing Lower body dressing      What is the patient wearing?: Incontinence brief     Lower body assist Assist for lower body dressing: 2 Helpers     Toileting Toileting Toileting Activity did not occur (Clothing management and hygiene only): N/A (no void or bm)  Toileting assist Assist for toileting: 2 Helpers     Transfers Chair/bed transfer  Transfers assist     Chair/bed transfer assist level: Moderate Assistance - Patient 50 - 74%     Locomotion Ambulation   Ambulation assist   Ambulation activity did not occur: Safety/medical concerns (decreased strength/activity tolerance)  Assist level: Moderate Assistance - Patient 50 - 74% Assistive device: Parallel bars Max distance: 8   Walk 10 feet activity   Assist  Walk 10 feet activity did not occur: Safety/medical concerns (decreased strength activity tolerance)        Walk 50 feet activity   Assist Walk 50 feet with 2 turns activity did not occur: Safety/medical concerns (decreased strength activity tolerance)         Walk 150 feet activity  Assist Walk 150 feet activity did not occur: Safety/medical concerns         Walk 10 feet on uneven surface  activity   Assist Walk 10 feet on uneven surfaces activity did not occur: Safety/medical concerns         Wheelchair     Assist Will patient use wheelchair at discharge?: Yes (Per PT long term goals ) Type of Wheelchair: Manual    Wheelchair assist level: Supervision/Verbal cueing Max wheelchair distance: 80     Wheelchair 50 feet with 2 turns activity    Assist        Assist Level: Supervision/Verbal cueing   Wheelchair 150 feet activity     Assist     Assist Level: Maximal Assistance - Patient 25 - 49%      Medical Problem List and Plan: 1.  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  Status post TPA.  Status post loop recorder placement  Continue CIR  Continue ophthalmic solution/ointments 2.  Antithrombotics: -DVT/anticoagulation: Lovenox.  Vascular study negative             -antiplatelet therapy: Aspirin 81 mg daily and Plavix 85 mg daily x3 weeks then Plavix alone 3. Pain Management: Oxycodone 15 mg every 4 hours as needed chronic pain  Controlled with meds on 7/16  Monitor with increased exertion 4. Mood: Melatonin 3 mg nightly, Remeron 7.5 mg nightly as needed,  BuSpar 30 mg twice daily             -antipsychotic agents: N/A 5. Neuropsych: This patient is ?fully capable of making decisions on her own behalf. 6. Skin/Wound Care: Routine skin checks 7. Fluids/Electrolytes/Nutrition: Routine in and outs  Reordered daily multivitamin she takes at home 8.  Hypertension.  Monitor with increased mobility  Lisinopril 10, amlodipine 10 PTA   Lisinopril 5 started on 7/12, decreased to 2.5 on 7/17  Monitor with increased mobility 9.  Hyperlipidemia.    Continue Lipitor 10.  Obesity.  BMI 35.56.  Follow-up dietary 11.  OSA.  Patient on CPAP at home. 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  Follow-up outpatient cardiology service 13. Dementia: Namenda 28 mg daily, Aricept 10 mg nightly. 14.  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  Mild hypoalbuminemia  Supplement initiated on 7/13  16. Constipation  Bowel meds increased on 7/15 17.  Post stroke dysphagia  D2 thins, advance as tolerated  LOS: 4 days A FACE TO FACE EVALUATION WAS PERFORMED  [name] [name] 02/25/2020, 10:36 AM

## 2020-02-26 ENCOUNTER — Inpatient Hospital Stay (HOSPITAL_COMMUNITY): Payer: Medicare Other | Admitting: Occupational Therapy

## 2020-02-26 ENCOUNTER — Inpatient Hospital Stay (HOSPITAL_COMMUNITY): Payer: Medicare Other

## 2020-02-26 MED ORDER — SORBITOL 70 % SOLN
60.0000 mL | Status: AC
  Administered 2020-02-26: 60 mL via ORAL
  Filled 2020-02-26: qty 60

## 2020-02-26 NOTE — Progress Notes (Signed)
Occupational Therapy Session Note  Patient Details  Name: [name] MRN: [id number] Date of Birth: 12/05/1940  Today's Date: 02/26/2020 OT Individual Time: 0700-0730 OT Individual Time Calculation (min): 30 min    Short Term Goals: Week 1:  OT Short Term Goal 1 (Week 1): Pt will transfer to [facility] with maxA of one person with LRAD. OT Short Term Goal 2 (Week 1): Pt will don shirt with minA and moderate cognitive cues. OT Short Term Goal 3 (Week 1): Pt will dress LB with maxA with LRAD to demonstrated improved dynamic standing balance. OT Short Term Goal 4 (Week 1): Pt will attend to items located on Lt side during grooming tasks with moderate cues.  Skilled Therapeutic Interventions/Progress Updates:    1:1. Pt received in bed agreeable to "a little" therapy after encouragement and education on participation to improve function in order to go home. Pt tearful stating, "I want to go home to my real [place] [[place]]." pt completes supine>sitting EOB with A for trunk elevation and sits EOB with S-CGA for trunk lengthening R via tactile cues. Pt require A to thread BLE into pants at EOB with A for postural control at trunk when reaching forward. Pt  Sit to stand 4x for blocked practice and OT changes brief, and pt advances pants past hips with MIN A for power up and standing balance. After LB dressing pt requesting to terminate session and misses 30 min d/t "just wanting to lie down." Exited sessionw iht pt seate din bed exit alarm on an call light in reach  Therapy Documentation Precautions:  Precautions Precautions: Fall Restrictions Weight Bearing Restrictions: No General:   Vital Signs:  Pain:   ADL: ADL Grooming: Moderate assistance Where Assessed-Grooming: Sitting at sink Upper Body Bathing: Moderate assistance Where Assessed-Upper Body Bathing: Sitting at sink Lower Body Bathing: Maximal assistance Where Assessed-Lower Body Bathing: Sitting at sink Upper Body Dressing:  Moderate assistance Where Assessed-Upper Body Dressing: Sitting at sink Lower Body Dressing: Dependent Where Assessed-Lower Body Dressing: Other (Comment) (stedy) Toilet Transfer: Dependent (stedy) Toilet Transfer Method: Other (comment) (stedy) Vision   Perception    Praxis   Exercises:   Other Treatments:     Therapy/Group: Individual Therapy  Shon Hale 02/26/2020, 10:58 AM

## 2020-02-26 NOTE — Progress Notes (Signed)
[place] [facility] PHYSICAL MEDICINE & REHABILITATION PROGRESS NOTE  Subjective/Complaints: Pt complains of fatigue and told me she didn't sleep well. 5 minutes prior told OT that she DID sleep well. Refused therapy also this morning with OT  ROS: Patient denies fever, rash, sore throat, blurred vision, nausea, vomiting, diarrhea, cough, shortness of breath or chest pain, joint or back pain, headache, or mood change.    Objective: Vital Signs: Blood pressure (!) 128/51, pulse (!) 48, temperature (!) 97.5 F (36.4 C), resp. rate 16, height 5\' 2"  (1.575 m), weight 83.8 kg, SpO2 96 %. No results found. No results for input(s): WBC, HGB, HCT, PLT in the last 72 hours. Recent Labs    02/25/20 0600  NA 144  K 3.5  CL 105  CO2 30  GLUCOSE 84  BUN 16  CREATININE 0.93  CALCIUM 9.1    Physical Exam: BP (!) 128/51 (BP Location: Left Arm)   Pulse (!) 48   Temp (!) 97.5 F (36.4 C)   Resp 16   Ht 5\' 2"  (1.575 m)   Wt 83.8 kg   SpO2 96%   BMI 33.79 kg/m  Constitutional: No distress . Vital signs reviewed. HEENT: EOMI, oral membranes moist Neck: supple Cardiovascular: RRR without murmur. No JVD    Respiratory/Chest: CTA Bilaterally without wheezes or rales. Normal effort    GI/Abdomen: BS +, non-tender, non-distended Ext: no clubbing, cyanosis, or edema Psych: pleasant and cooperative Musc: Bilateral lower extremity edema No edema in extremities.  No tenderness in extremities. Neuro:  Alert Right gaze preference, left eye patched Left cranial nerve VII palsy Dysarthric but intelligible Motor: Bilateral upper extremities: 4+/5 proximal distal Left lower extremity: 4-/5 hip flexion, knee extension, 4/5 ankle dorsiflexion Right lower extremity: Hip flexion, knee extension 4/5, ankle dorsiflexion 4/5  Assessment/Plan: 1. Functional deficits secondary to bilateral cerebellar and cerebral infarcts which require 3+ hours per day of interdisciplinary therapy in a comprehensive inpatient  rehab setting.  Physiatrist is providing close team supervision and 24 hour management of active medical problems listed below.  Physiatrist and rehab team continue to assess barriers to discharge/monitor patient progress toward functional and medical goals  Care Tool:  Bathing  Bathing activity did not occur:  (N/A) Body parts bathed by patient: Right arm, Face   Body parts bathed by helper: Right arm, Left arm, Chest, Abdomen, Face     Bathing assist Assist Level: Supervision/Verbal cueing     Upper Body Dressing/Undressing Upper body dressing   What is the patient wearing?: Pull over shirt    Upper body assist Assist Level: Minimal Assistance - Patient > 75%    Lower Body Dressing/Undressing Lower body dressing      What is the patient wearing?: Incontinence brief, Pants     Lower body assist Assist for lower body dressing: Maximal Assistance - Patient 25 - 49%     Toileting Toileting Toileting Activity did not occur (Clothing management and hygiene only): N/A (no void or bm)  Toileting assist Assist for toileting: 2 Helpers     Transfers Chair/bed transfer  Transfers assist     Chair/bed transfer assist level: Moderate Assistance - Patient 50 - 74%     Locomotion Ambulation   Ambulation assist   Ambulation activity did not occur: Safety/medical concerns (decreased strength/activity tolerance)  Assist level: Minimal Assistance - Patient > 75% Assistive device: Parallel bars Max distance: 8'   Walk 10 feet activity   Assist  Walk 10 feet activity did not occur: Safety/medical  concerns (decreased strength activity tolerance)        Walk 50 feet activity   Assist Walk 50 feet with 2 turns activity did not occur: Safety/medical concerns (decreased strength activity tolerance)         Walk 150 feet activity   Assist Walk 150 feet activity did not occur: Safety/medical concerns         Walk 10 feet on uneven surface   activity   Assist Walk 10 feet on uneven surfaces activity did not occur: Safety/medical concerns         Wheelchair     Assist Will patient use wheelchair at discharge?: Yes (Per PT long term goals ) Type of Wheelchair: Manual    Wheelchair assist level: Supervision/Verbal cueing Max wheelchair distance: 80    Wheelchair 50 feet with 2 turns activity    Assist        Assist Level: Supervision/Verbal cueing   Wheelchair 150 feet activity     Assist     Assist Level: Maximal Assistance - Patient 25 - 49%      Medical Problem List and Plan: 1.  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  Status post TPA.  Status post loop recorder placement  Continue CIR  Continue ophthalmic solution/ointments  -encouraged pt to participate in therapies 2.  Antithrombotics: -DVT/anticoagulation: Lovenox.  Vascular study negative             -antiplatelet therapy: Aspirin 81 mg daily and Plavix 85 mg daily x3 weeks then Plavix alone 3. Pain Management: Oxycodone 15 mg every 4 hours as needed chronic pain  Controlled with meds on 7/17  Monitor with increased exertion 4. Mood: Melatonin 3 mg nightly, Remeron 7.5 mg nightly as needed,  BuSpar 30 mg twice daily             -antipsychotic agents: N/A  7/17 will not make any changes to sleep regimen 5. Neuropsych: This patient is ?fully capable of making decisions on her own behalf. 6. Skin/Wound Care: Routine skin checks 7. Fluids/Electrolytes/Nutrition: Routine in and outs  Reordered daily multivitamin she takes at home 8.  Hypertension.  Monitor with increased mobility  Lisinopril 10, amlodipine 10 PTA   Lisinopril 5 started on 7/12, decreased to 2.5 on 7/17  7/17 controlled 9.  Hyperlipidemia.    Continue Lipitor 10.  Obesity.  BMI 35.56.  Follow-up dietary 11.  OSA.  Patient on CPAP at home. 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   Follow-up outpatient cardiology service 13. Dementia: Namenda 28 mg daily, Aricept 10 mg nightly. 14.  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  Mild hypoalbuminemia  Supplement initiated on 7/13  16. Constipation  Bowel meds increased on 7/15, still no bm since 7/14   -try sorbitol today 17.  Post stroke dysphagia  D2 thins, advance as tolerated  LOS: 5 days A FACE TO FACE EVALUATION WAS PERFORMED  Ranelle Oyster 02/26/2020, 10:23 AM

## 2020-02-27 ENCOUNTER — Inpatient Hospital Stay (HOSPITAL_COMMUNITY): Payer: Medicare Other | Admitting: Occupational Therapy

## 2020-02-27 ENCOUNTER — Inpatient Hospital Stay (HOSPITAL_COMMUNITY): Payer: Medicare Other

## 2020-02-27 MED ORDER — WHITE PETROLATUM EX OINT
TOPICAL_OINTMENT | CUTANEOUS | Status: AC
  Administered 2020-02-27: 1
  Filled 2020-02-27: qty 28.35

## 2020-02-27 NOTE — Progress Notes (Signed)
Occupational Therapy Session Note  Patient Details  Name: [name] MRN: [id number] Date of Birth: 03-Aug-1941  Today's Date: 02/27/2020 OT Individual Time: 1000-1028 OT Individual Time Calculation (min): 28 min    Short Term Goals: Week 1:  OT Short Term Goal 1 (Week 1): Pt will transfer to [facility] with maxA of one person with LRAD. OT Short Term Goal 2 (Week 1): Pt will don shirt with minA and moderate cognitive cues. OT Short Term Goal 3 (Week 1): Pt will dress LB with maxA with LRAD to demonstrated improved dynamic standing balance. OT Short Term Goal 4 (Week 1): Pt will attend to items located on Lt side during grooming tasks with moderate cues.  Skilled Therapeutic Interventions/Progress Updates:    Pt greeted at time of session supine in bed with HOB elevated, no c/o pain throughout session. Declined ADL, stating she wanted to remain in same clothes since she just put them on yesterday. Bed mob supine to sitting EOB with min A to manage trunk and once sitting EOB, hard R lateral and posterior lean which improved with tactile cues and extended sitting at EOB, improved with CGA/Supervision. Oral hygiene and face washing sitting EOB with frequent tactile cues to return to midline. NMR for trunk control and improving righting reactions w/ pt able to feel when she is leaning and able to correct but does require cues. Lateral leans on to elbows for both L and R to improve core strength and lateral leans to L side. Pt returned to supine Min A, scooting up in bed Min as well able to bridge. Pt in bed with alarm on, call bell in reach, all needs met.     Therapy Documentation Precautions:  Precautions Precautions: Fall Restrictions Weight Bearing Restrictions: No    Therapy/Group: Individual Therapy  Viona Gilmore 02/27/2020, 10:32 AM

## 2020-02-27 NOTE — Progress Notes (Signed)
Physical Therapy Session Note  Patient Details  Name: [name] MRN: [id number] Date of Birth: [birth year]  Today's Date: 02/27/2020 PT Individual Time: 1100-1200 PT Individual Time Calculation (min): 60 min   Short Term Goals: Week 1:  PT Short Term Goal 1 (Week 1): pt to perform bed mobility at min A x1 PT Short Term Goal 2 (Week 1): pt to perform STS transfers and stand pivot transfers at mod A x2 PT Short Term Goal 3 (Week 1): pt to initiate gait training PT Short Term Goal 4 (Week 1): pt to perform manual WC propulsion at mod A x1 for 150' Week 2:    Week 3:     Skilled Therapeutic Interventions/Progress Updates:    PAIN c/o increased back pain w/standing, rest breaks given in sitting w/relief achieved.   Pt initially supine and agreeable to treatment.  Supine to side to sit w/mod assist.  Static sit w/feet supported w/cga to mod assist due to inconsistent post tendency.  In sitting pt removes night shirt and donns clean shirt w/mod assist and max cues due to progressive post tendency/lean.  Needs assist to thread pants to thighs due to balance.  STS w/mod assist and raises pants from thights w/cga for balance. SPT to wc mod assist.  Pt transported to gym for continjued session.  STS in parallel bars w/min assist.  Stands w/moderately flexed posture/stooped in appearance. Worked on static stand w/bilat UE support, single UE support L/R, pt w/ant tendency and lateral tendency to unsupported side w/activity.  Gait in parallel bars 5 steps forward/back w/wobbles L knee, flexed posture, inconsistent step length/foot placement L, min assist/guarding of L knee. Repeated gait/retrogait x 4 passes w/seated rest between efforts.  Standing giant checkers game - pt stood w/ue support on elevated table and performed checkers including visually scanning board in all quadrants, tolerated standing x 4 min, 2 minW/cga and reaches/scanns all areas of board, demonstrates good awareness of game  strategy, uses bilat UEs, decreased coordination R hand, progressive R lean w/fatigue. Rested in sitting between standing trials.   Completed game from sitting using R hand to manipulate checkers for coordination challenge.   Pt transported back to room and left oob in wc w/alarm belt set and all needs in reach.  Therapy Documentation Precautions:  Precautions Precautions: Fall Restrictions Weight Bearing Restrictions: No    Therapy/Group: Individual Therapy  Rada Hay, PT   Shearon Balo 02/27/2020, 12:09 PM

## 2020-02-27 NOTE — Progress Notes (Signed)
Occupational Therapy Session Note  Patient Details  Name: [name] MRN: [id number] Date of Birth: 24-Jul-1941  Today's Date: 02/27/2020 OT Individual Time: 1300-1359 OT Individual Time Calculation (min): 59 min    Short Term Goals: Week 1:  OT Short Term Goal 1 (Week 1): Pt will transfer to [facility] with maxA of one person with LRAD. OT Short Term Goal 2 (Week 1): Pt will don shirt with minA and moderate cognitive cues. OT Short Term Goal 3 (Week 1): Pt will dress LB with maxA with LRAD to demonstrated improved dynamic standing balance. OT Short Term Goal 4 (Week 1): Pt will attend to items located on Lt side during grooming tasks with moderate cues.  Skilled Therapeutic Interventions/Progress Updates:    Treatment session with focus on dynamic standing balance to improve ADL independence, Lt attention to improve safety awareness during functional mobility, and dynamic sitting balance. Received pt seated in w/c. Completed activity with cognitive component targeting Lt attention and functional reach of BUE to improve reach during sink level activities. Increased challenge to include visual discrimination. Completed blocked practice sit <> stands with modA for power up and CGA to maintain static standing. Progressed to dynamic standing balance alternating BUE, providing minA to facilitate postural control. Facilitated rest breaks due to pt fatigue. Completed squat pivot transfer with modA to mat and to bed, facilitating anterior weight shift and cueing for hand placement. Completed dynamic sitting balance activity targeting unsupported sitting tolerance to increase independence during EOB tasks. Ended session with pt semi-reclined in bed with all needs within reach and bed alarm on.  Therapy Documentation Precautions:  Precautions Precautions: Fall Restrictions Weight Bearing Restrictions: No General:   Vital Signs:  Pain: pain reported in back, repositioned  Therapy/Group: Individual  Therapy  Blair Heys 02/27/2020, 3:10 PM

## 2020-02-27 NOTE — Progress Notes (Signed)
[place] PHYSICAL MEDICINE & REHABILITATION PROGRESS NOTE  Subjective/Complaints: In good spirits. Slept well. No complaints. Doesn't recall having had therapy yesterday  ROS: Patient denies fever, rash, sore throat, blurred vision, nausea, vomiting, diarrhea, cough, shortness of breath or chest pain, joint or back pain, headache, or mood change.    Objective: Vital Signs: Blood pressure (!) 144/49, pulse (!) 42, temperature 97.7 F (36.5 C), resp. rate 16, height 5\' 2"  (1.575 m), weight 83.8 kg, SpO2 94 %. No results found. No results for input(s): WBC, HGB, HCT, PLT in the last 72 hours. Recent Labs    02/25/20 0600  NA 144  K 3.5  CL 105  CO2 30  GLUCOSE 84  BUN 16  CREATININE 0.93  CALCIUM 9.1    Physical Exam: BP (!) 144/49 (BP Location: Left Arm)   Pulse (!) 42   Temp 97.7 F (36.5 C)   Resp 16   Ht 5\' 2"  (1.575 m)   Wt 83.8 kg   SpO2 94%   BMI 33.79 kg/m  Constitutional: No distress . Vital signs reviewed. HEENT: EOMI, oral membranes moist. Left eye patched Neck: supple Cardiovascular: RRR without murmur. No JVD    Respiratory/Chest: CTA Bilaterally without wheezes or rales. Normal effort    GI/Abdomen: BS +, non-tender, non-distended Ext: no clubbing, cyanosis, or edema Psych: pleasant and cooperative Musc: Bilateral lower extremity edema No edema in extremities.  No tenderness in extremities. Neuro:  Alert. Limited insight. Normal language Right gaze preference,  Left cranial nerve VII palsy Dysarthric but intelligible Motor: Bilateral upper extremities: 4+/5 proximal distal Left lower extremity: 4-/5 hip flexion, knee extension, 4/5 ankle dorsiflexion Right lower extremity: Hip flexion, knee extension 4/5, ankle dorsiflexion 4/5  Assessment/Plan: 1. 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  Physiatrist is  providing close team supervision and 24 hour management of active medical problems listed below.  Physiatrist and rehab team continue to assess barriers to discharge/monitor patient progress toward functional and medical goals  Care Tool:  Bathing  Bathing activity did not occur:  (N/A) Body parts bathed by patient: Right arm, Face   Body parts bathed by helper: Right arm, Left arm, Chest, Abdomen, Face     Bathing assist Assist Level: Supervision/Verbal cueing     Upper Body Dressing/Undressing Upper body dressing   What is the patient wearing?: Pull over shirt    Upper body assist Assist Level: Minimal Assistance - Patient > 75%    Lower Body Dressing/Undressing Lower body dressing      What is the patient wearing?: Incontinence brief, Pants     Lower body assist Assist for lower body dressing: Maximal Assistance - Patient 25 - 49%     Toileting Toileting Toileting Activity did not occur (Clothing management and hygiene only): N/A (no void or bm)  Toileting assist Assist for toileting: 2 Helpers     Transfers Chair/bed transfer  Transfers assist     Chair/bed transfer assist level: Moderate Assistance - Patient 50 - 74%     Locomotion Ambulation   Ambulation assist   Ambulation activity did not occur: Safety/medical concerns (decreased strength/activity tolerance)  Assist level: Minimal Assistance - Patient > 75% Assistive device: Parallel bars Max distance: 8'   Walk 10 feet activity   Assist  Walk 10 feet activity did not occur: Safety/medical concerns (decreased strength activity tolerance)  Walk 50 feet activity   Assist Walk 50 feet with 2 turns activity did not occur: Safety/medical concerns (decreased strength activity tolerance)         Walk 150 feet activity   Assist Walk 150 feet activity did not occur: Safety/medical concerns         Walk 10 feet on uneven surface  activity   Assist Walk 10 feet on uneven  surfaces activity did not occur: Safety/medical concerns         Wheelchair     Assist Will patient use wheelchair at discharge?: Yes (Per PT long term goals ) Type of Wheelchair: Manual    Wheelchair assist level: Supervision/Verbal cueing Max wheelchair distance: 80    Wheelchair 50 feet with 2 turns activity    Assist        Assist Level: Supervision/Verbal cueing   Wheelchair 150 feet activity     Assist     Assist Level: Maximal Assistance - Patient 25 - 49%      Medical Problem List and Plan: 1.  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  Status post TPA.  Status post loop recorder placement  Continue CIR  Continue ophthalmic solution/ointments  -encouraged pt to participate in therapies 2.  Antithrombotics: -DVT/anticoagulation: Lovenox.  Vascular study negative             -antiplatelet therapy: Aspirin 81 mg daily and Plavix 85 mg daily x3 weeks then Plavix alone 3. Pain Management: Oxycodone 15 mg every 4 hours as needed chronic pain  Controlled with meds on 7/18  Monitor with increased exertion 4. Mood: Melatonin 3 mg nightly, Remeron 7.5 mg nightly as needed,  BuSpar 30 mg twice daily             -antipsychotic agents: N/A  7/17 will not make any changes to sleep regimen 5. Neuropsych: This patient is ?fully capable of making decisions on her own behalf. 6. Skin/Wound Care: Routine skin checks 7. Fluids/Electrolytes/Nutrition: Routine in and outs  Reordered daily multivitamin she takes at home 8.  Hypertension.  Monitor with increased mobility  Lisinopril 10, amlodipine 10 PTA   Lisinopril 5 started on 7/12, decreased to 2.5 on 7/17  7/18 controlled 9.  Hyperlipidemia.    Continue Lipitor 10.  Obesity.  BMI 35.56.  Follow-up dietary 11.  OSA.  Patient on CPAP at home. 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  Follow-up outpatient cardiology service 13.  Dementia: Namenda 28 mg daily, Aricept 10 mg nightly. 14.  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  Mild hypoalbuminemia  Supplement initiated on 7/13  16. 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

## 2020-02-28 ENCOUNTER — Inpatient Hospital Stay (HOSPITAL_COMMUNITY): Payer: Medicare Other

## 2020-02-28 ENCOUNTER — Inpatient Hospital Stay (HOSPITAL_COMMUNITY): Payer: Medicare Other | Admitting: Speech Pathology

## 2020-02-28 DIAGNOSIS — G479 Sleep disorder, unspecified: Secondary | ICD-10-CM

## 2020-02-28 DIAGNOSIS — K5903 Drug induced constipation: Secondary | ICD-10-CM

## 2020-02-28 LAB — CREATININE, SERUM
Creatinine, Ser: 0.86 mg/dL (ref 0.44–1.00)
GFR calc Af Amer: >60 mL/min (ref >60)
GFR calc non Af Amer: >60 mL/min (ref >60)

## 2020-02-28 MED ORDER — MIRTAZAPINE 15 MG PO TABS
15.0000 mg | ORAL_TABLET | ORAL | Status: DC | PRN
  Administered 2020-02-28 – 2020-03-11 (×9): 15 mg via ORAL
  Filled 2020-02-28 (×9): qty 1

## 2020-02-28 NOTE — Progress Notes (Signed)
Patient ID: [name], female   DOB: 1940-11-09, 79 y.o.   MRN: [id number]   Sw followed up with patient to see about ay questions or concerns. Patient complains of not sleeping well, physician aware.

## 2020-02-28 NOTE — Progress Notes (Signed)
Physical Therapy Session Note  Patient Details  Name: [name] MRN: [id number] Date of Birth: Nov 14, 1940  Today's Date: 02/28/2020 PT Individual Time: 1115-1155 PT Individual Time Calculation (min): 40 min   Short Term Goals: Week 1:  PT Short Term Goal 1 (Week 1): pt to perform bed mobility at min A x1 PT Short Term Goal 2 (Week 1): pt to perform STS transfers and stand pivot transfers at mod A x2 PT Short Term Goal 3 (Week 1): pt to initiate gait training PT Short Term Goal 4 (Week 1): pt to perform manual WC propulsion at mod A x1 for 150'  Skilled Therapeutic Interventions/Progress Updates:   Received pt supine in bed asleep, upon wakening pt reported not sleeping well last night and c/o fatigue. Pt politely declined OOB mobility but was agreeable to bed level exercises, and denied any pain during session. Session with emphasis on generalized strengthening and improved activity tolerance. Pt performed the following exercises with supervision, increased time, and verbal/visual cues for technique: -heel slides x8 bilaterally  -hip abduction x15 bilaterally  -hip adduction pillow squeezes 2x10  -hip abduction with orange TB 1x10 and 1x11 -sidelying clamshells x10 bilaterally  Pt reported her L LE felt stronger than R LE during exercises and stated that when she D/C she will have assist from her granddaughter, her granddaughter's mother, and her son and her son is planning on hiring assist. Pt frequently closing eyes during session and required multiple rest breaks throughout due to fatigue. Pt scooted to [facility] with CGA and verbal cues for hooklying position of LEs. Concluded session with pt supine in bed, needs within reach, and bed alarm on. Memory book updated.   Therapy Documentation Precautions:  Precautions Precautions: Fall Restrictions Weight Bearing Restrictions: No  Therapy/Group: Individual Therapy Martin Majestic PT, DPT   02/28/2020, 7:35 AM

## 2020-02-28 NOTE — Progress Notes (Signed)
[facility] PHYSICAL MEDICINE & REHABILITATION PROGRESS NOTE  Subjective/Complaints: Patient seen laying in bed this morning.  She states she did not sleep well overnight.  Discussed with nursing as well.  Patient states she has these nights from time to time and her son gives her some medication which helps, but she cannot recall what medication it is.  She is going to ask her son.  ROS: Denies CP, SOB, N/V/D  Objective: Vital Signs: Blood pressure (!) 118/59, pulse (!) 42, temperature 98 F (36.7 C), temperature source Oral, resp. rate 16, height 5\' 2"  (1.575 m), weight 83.8 kg, SpO2 91 %. No results found. No results for input(s): WBC, HGB, HCT, PLT in the last 72 hours. Recent Labs    02/28/20 0526  CREATININE 0.86    Physical Exam: BP (!) 118/59 (BP Location: Right Arm)   Pulse (!) 42   Temp 98 F (36.7 C) (Oral)   Resp 16   Ht 5\' 2"  (1.575 m)   Wt 83.8 kg   SpO2 91%   BMI 33.79 kg/m   Constitutional: No distress . Vital signs reviewed. HENT: Normocephalic.  Atraumatic. Eyes: EOMI. No discharge. Cardiovascular: No JVD.  RRR. Respiratory: Normal effort.  No stridor.  Bilateral clear to auscultation. GI: Non-distended.  BS +. Skin: Warm and dry.  Intact. Psych: Normal mood.  Normal behavior. Musc: Bilateral lower extreme edema. Neuro:  Alert Left > right limitations in gaze  Left cranial nerve VII palsy Dysarthria Motor: Bilateral upper extremities: 4+/5 proximal distal Left lower extremity: 4-/5 hip flexion, knee extension, 4/5 ankle dorsiflexion Right lower extremity: Hip flexion, knee extension 4/5, ankle dorsiflexion 4/5, stable  Assessment/Plan: 1. 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  Physiatrist is providing close team supervision and 24 hour management of active medical problems listed below.  Physiatrist and  rehab team continue to assess barriers to discharge/monitor patient progress toward functional and medical goals  Care Tool:  Bathing  Bathing activity did not occur:  (N/A) Body parts bathed by patient: Right arm, Face   Body parts bathed by helper: Right arm, Left arm, Chest, Abdomen, Face     Bathing assist Assist Level: Supervision/Verbal cueing     Upper Body Dressing/Undressing Upper body dressing   What is the patient wearing?: Pull over shirt    Upper body assist Assist Level: Minimal Assistance - Patient > 75%    Lower Body Dressing/Undressing Lower body dressing      What is the patient wearing?: Incontinence brief, Pants     Lower body assist Assist for lower body dressing: Maximal Assistance - Patient 25 - 49%     Toileting Toileting Toileting Activity did not occur (Clothing management and hygiene only): N/A (no void or bm)  Toileting assist Assist for toileting: 2 Helpers     Transfers Chair/bed transfer  Transfers assist     Chair/bed transfer assist level: Moderate Assistance - Patient 50 - 74%     Locomotion Ambulation   Ambulation assist   Ambulation activity did not occur: Safety/medical concerns (decreased strength/activity tolerance)  Assist level: Minimal Assistance - Patient > 75% Assistive device: Parallel bars Max distance: 8'   Walk 10 feet activity   Assist  Walk 10 feet activity did not occur: Safety/medical concerns (decreased strength activity tolerance)        Walk 50 feet activity   Assist Walk 50 feet with  2 turns activity did not occur: Safety/medical concerns (decreased strength activity tolerance)         Walk 150 feet activity   Assist Walk 150 feet activity did not occur: Safety/medical concerns         Walk 10 feet on uneven surface  activity   Assist Walk 10 feet on uneven surfaces activity did not occur: Safety/medical concerns         Wheelchair     Assist Will patient use  wheelchair at discharge?: Yes (Per PT long term goals ) Type of Wheelchair: Manual    Wheelchair assist level: Supervision/Verbal cueing Max wheelchair distance: 80    Wheelchair 50 feet with 2 turns activity    Assist        Assist Level: Supervision/Verbal cueing   Wheelchair 150 feet activity     Assist     Assist Level: Maximal Assistance - Patient 25 - 49%      Medical Problem List and Plan: 1.  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  Status post TPA.  Status post loop recorder placement  Continue CIR  Continue ophthalmic solution/ointments 2.  Antithrombotics: -DVT/anticoagulation: Lovenox.  Vascular study negative             -antiplatelet therapy: Aspirin 81 mg daily and Plavix 85 mg daily x3 weeks then Plavix alone 3. Pain Management: Oxycodone 15 mg every 4 hours as needed chronic pain  Controlled with meds on 7/19  Monitor with increased exertion 4. Mood: Melatonin 3 mg nightly, Remeron 7.5 mg nightly as needed,  BuSpar 30 mg twice daily             -antipsychotic agents: N/A 5. Neuropsych: This patient is ?fully capable of making decisions on her own behalf. 6. Skin/Wound Care: Routine skin checks 7. Fluids/Electrolytes/Nutrition: Routine in and outs  Reordered daily multivitamin she takes at home 8.  Hypertension.  Monitor with increased mobility  Lisinopril 10, amlodipine 10 PTA   Lisinopril 5 started on 7/12, decreased to 2.5 on 7/17  Controlled on 7/19 9.  Hyperlipidemia.    Continue Lipitor 10.  Obesity.  BMI 35.56.  Follow-up dietary 11.  OSA.  Patient on CPAP at home. 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  Follow-up outpatient cardiology service 13. Dementia: Namenda 28 mg daily, Aricept 10 mg nightly. 14.  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  Mild hypoalbuminemia  Supplement initiated on 7/13  16.  Drug-induced  constipation  Bowel meds increased on 7/15  Improving 17.  Post stroke dysphagia  D2 thins, advance as tolerated 18. Sleep disturbance  See #3  Patient to ask her son which she takes at home which helps her sleep  LOS: 7 days A FACE TO FACE EVALUATION WAS PERFORMED  Loise Esguerra [name] 02/28/2020, 12:48 PM

## 2020-02-28 NOTE — Progress Notes (Signed)
Speech Language Pathology Daily Session Note  Patient Details  Name: [name] MRN: [id number] Date of Birth: 11/24/1940  Today's Date: 02/28/2020 SLP Individual Time: 0725-0755 SLP Individual Time Calculation (min): 30 min  Short Term Goals: Week 1: SLP Short Term Goal 1 (Week 1): Pt will complete familar and functional problem solving tasks with min A verbal cues. SLP Short Term Goal 2 (Week 1): Pt will self-monitor and self-correct errors in functional tasks with min A verbal cues. SLP Short Term Goal 3 (Week 1): Pt will demonstrate sustained attention in 10 minute intervals with min A verbal cues for redirection during functional tasks. SLP Short Term Goal 4 (Week 1): Pt will utilizie external aids to recall novel and daily information with mod A verbal cues. SLP Short Term Goal 5 (Week 1): Pt will consume dys 2 textures and thin liquid diet with mod I use of swallow strategies. SLP Short Term Goal 6 (Week 1): Pt will consume trials of dys 3 textures over x3 sessions with apporpriate oral clearance and no overt s/s aspiration with supervision A verbal cues.  Skilled Therapeutic Interventions: Skilled treatment session focused on dysphagia and speech goals. SLP facilitated session by providing Min A verbal cues for patient to self-monitor and correct oral residue with breakfast meal of Dys. 2 textures with thin liquids. Patient with one episode of coughing due to talking with a full oral cavity. Recommend patient continue current diet. Min verbal cues were also utilized for use of speech intelligibility strategies to maximize intelligibility to ~90% at the sentence level. Patient left upright in bed with alarm on and all needs within reach. Continue with current plan of care.      Pain No/Denies Pain   Therapy/Group: Individual Therapy  [name] 02/28/2020, 12:47 PM

## 2020-02-28 NOTE — Progress Notes (Signed)
Occupational Therapy Session Note  Patient Details  Name: CHANTEE CERINO MRN: [id number] Date of Birth: 10/03/1940  Today's Date: 02/28/2020 OT Individual Time: 5465-0354 OT Individual Time Calculation (min): 60 min    Short Term Goals: Week 1:  OT Short Term Goal 1 (Week 1): Pt will transfer to [facility] with maxA of one person with LRAD. OT Short Term Goal 2 (Week 1): Pt will don shirt with minA and moderate cognitive cues. OT Short Term Goal 3 (Week 1): Pt will dress LB with maxA with LRAD to demonstrated improved dynamic standing balance. OT Short Term Goal 4 (Week 1): Pt will attend to items located on Lt side during grooming tasks with moderate cues.  Skilled Therapeutic Interventions/Progress Updates:    1:1. Pt received in bed agreeable ot OT with encouragement. Pt with no pain but reporting did not sleep at all last night and very fatigued. Pt completes supine>sitting EOB with tactile cues for LE management and A for trnk elevation. Pt dons pants with A to thread BLE into pants. Pt reporting dizziness. BP assessed in supine 144/57 and sitting EOB 126/66. Pt sit to stand at EOB with MOD A and pt and advances patns past hips. Pt agreeable ot bed level tx d/t fatigue. Pt completes graded peg board activity with mod VC for noticing/responding to mistakes and board set up across tray table for increased visual scanning in B directions with eye patch on L eye. Exite session wih tpt seated in bed, exit alarm on and call light tin reach  Therapy Documentation Precautions:  Precautions Precautions: Fall Restrictions Weight Bearing Restrictions: No General:   Vital Signs:  Pain:   ADL: ADL Grooming: Moderate assistance Where Assessed-Grooming: Sitting at sink Upper Body Bathing: Moderate assistance Where Assessed-Upper Body Bathing: Sitting at sink Lower Body Bathing: Maximal assistance Where Assessed-Lower Body Bathing: Sitting at sink Upper Body Dressing: Moderate  assistance Where Assessed-Upper Body Dressing: Sitting at sink Lower Body Dressing: Dependent Where Assessed-Lower Body Dressing: Other (Comment) (stedy) Toilet Transfer: Dependent (stedy) Toilet Transfer Method: Other (comment) (stedy) Vision   Perception    Praxis   Exercises:   Other Treatments:     Therapy/Group: Individual Therapy  Shon Hale 02/28/2020, 8:59 AM

## 2020-02-29 ENCOUNTER — Inpatient Hospital Stay (HOSPITAL_COMMUNITY): Payer: Medicare Other

## 2020-02-29 ENCOUNTER — Inpatient Hospital Stay (HOSPITAL_COMMUNITY): Payer: Medicare Other | Admitting: Occupational Therapy

## 2020-02-29 MED ORDER — SENNOSIDES-DOCUSATE SODIUM 8.6-50 MG PO TABS
2.0000 | ORAL_TABLET | Freq: Two times a day (BID) | ORAL | Status: DC
  Administered 2020-02-29 – 2020-03-11 (×20): 2 via ORAL
  Filled 2020-02-29 (×24): qty 2

## 2020-02-29 NOTE — Plan of Care (Signed)
  Problem: RH BOWEL ELIMINATION Goal: RH STG MANAGE BOWEL WITH ASSISTANCE Description: STG Manage Bowel with Min assist 02/29/2020 1116 by Kalman Shan, LPN Outcome: Progressing 02/29/2020 1116 by Kalman Shan, LPN Outcome: Progressing   Problem: RH BOWEL ELIMINATION Goal: RH STG MANAGE BOWEL W/MEDICATION W/ASSISTANCE Description: STG Manage Bowel with Medication with Mod I Assistance. 02/29/2020 1116 by Kalman Shan, LPN Outcome: Progressing 02/29/2020 1116 by Kalman Shan, LPN Outcome: Progressing   Problem: RH BLADDER ELIMINATION Goal: RH STG MANAGE BLADDER WITH ASSISTANCE Description: STG Manage Bladder With mod I assist Outcome: Progressing   Problem: RH BLADDER ELIMINATION Goal: RH STG MANAGE BLADDER WITH MEDICATION WITH ASSISTANCE Description: STG Manage Bladder With Medication With Mod I Assistance. Outcome: Progressing   Problem: RH SKIN INTEGRITY Goal: RH STG SKIN FREE OF INFECTION/BREAKDOWN Description: Min assist to maintain skin integrity 02/29/2020 1116 by Kalman Shan, LPN Outcome: Progressing 02/29/2020 1116 by Kalman Shan, LPN Outcome: Progressing

## 2020-02-29 NOTE — Progress Notes (Signed)
[facility] PHYSICAL MEDICINE & REHABILITATION PROGRESS NOTE  Subjective/Complaints: Patient seen sitting up in bed this morning.  She states she slept well overnight.  Her Remeron dose was increased overnight.  ROS: Denies CP, SOB, N/V/D  Objective: Vital Signs: Blood pressure (!) 134/49, pulse (!) 41, temperature 98.2 F (36.8 C), temperature source Oral, resp. rate 14, height 5\' 2"  (1.575 m), weight 83.8 kg, SpO2 94 %. No results found. No results for input(s): WBC, HGB, HCT, PLT in the last 72 hours. Recent Labs    02/28/20 0526  CREATININE 0.86    Physical Exam: BP (!) 134/49 (BP Location: Left Arm)   Pulse (!) 41   Temp 98.2 F (36.8 C) (Oral)   Resp 14   Ht 5\' 2"  (1.575 m)   Wt 83.8 kg   SpO2 94%   BMI 33.79 kg/m   Constitutional: No distress . Vital signs reviewed. HENT: Normocephalic.  Atraumatic. Eyes: EOMI. No discharge. Cardiovascular: No JVD.  RRR. Respiratory: Normal effort.  No stridor.  Bilaterally clear to auscultation. GI: Non-distended.  BS +. Skin: Warm and dry.  Intact. Psych: Flat.  Somewhat delayed. Musc: Bilateral lower extremity edema. Neuro:  Alert Left > right limitations in gaze, unchanged Left cranial nerve VII palsy,?  Some improvement Dysarthria, unchanged Motor: Bilateral upper extremities: 4+/5 proximal distal Left lower extremity: 4/5 hip flexion, knee extension, 4/5 ankle dorsiflexion Right lower extremity: Hip flexion, knee extension 4/5, ankle dorsiflexion 4/5, stable  Assessment/Plan: 1. 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  Physiatrist is providing close team supervision and 24 hour management of active medical problems listed below.  Physiatrist and rehab team continue to assess barriers to discharge/monitor patient progress toward functional and medical goals  Care  Tool:  Bathing  Bathing activity did not occur:  (N/A) Body parts bathed by patient: Right arm, Face   Body parts bathed by helper: Right arm, Left arm, Chest, Abdomen, Face     Bathing assist Assist Level: Supervision/Verbal cueing     Upper Body Dressing/Undressing Upper body dressing   What is the patient wearing?: Pull over shirt    Upper body assist Assist Level: Minimal Assistance - Patient > 75%    Lower Body Dressing/Undressing Lower body dressing      What is the patient wearing?: Incontinence brief, Pants     Lower body assist Assist for lower body dressing: Maximal Assistance - Patient 25 - 49%     Toileting Toileting Toileting Activity did not occur (Clothing management and hygiene only): N/A (no void or bm)  Toileting assist Assist for toileting: 2 Helpers     Transfers Chair/bed transfer  Transfers assist     Chair/bed transfer assist level: Moderate Assistance - Patient 50 - 74%     Locomotion Ambulation   Ambulation assist   Ambulation activity did not occur: Safety/medical concerns (decreased strength/activity tolerance)  Assist level: Minimal Assistance - Patient > 75% Assistive device: Parallel bars Max distance: 8'   Walk 10 feet activity   Assist  Walk 10 feet activity did not occur: Safety/medical concerns (decreased strength activity tolerance)        Walk 50 feet activity   Assist Walk 50 feet with 2 turns activity did not occur: Safety/medical concerns (decreased strength activity tolerance)         Walk 150 feet activity   Assist Walk 150 feet activity did not  occur: Safety/medical concerns         Walk 10 feet on uneven surface  activity   Assist Walk 10 feet on uneven surfaces activity did not occur: Safety/medical concerns         Wheelchair     Assist Will patient use wheelchair at discharge?: Yes (Per PT long term goals ) Type of Wheelchair: Manual    Wheelchair assist level:  Supervision/Verbal cueing Max wheelchair distance: 80    Wheelchair 50 feet with 2 turns activity    Assist        Assist Level: Supervision/Verbal cueing   Wheelchair 150 feet activity     Assist     Assist Level: Maximal Assistance - Patient 25 - 49%      Medical Problem List and Plan: 1.  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  Status post TPA.  Status post loop recorder placement  Continue CIR  Continue ophthalmic solution/ointments 2.  Antithrombotics: -DVT/anticoagulation: Lovenox.  Vascular study negative             -antiplatelet therapy: Aspirin 81 mg daily and Plavix 85 mg daily x3 weeks then Plavix alone 3. Pain Management: Oxycodone 15 mg every 4 hours as needed chronic pain  Controlled with meds on 7/20  Monitor with increased exertion 4. Mood: Melatonin 3 mg nightly, Remeron 15 mg nightly as needed,  BuSpar 30 mg twice daily             -antipsychotic agents: N/A 5. Neuropsych: This patient is ?fully capable of making decisions on her own behalf. 6. Skin/Wound Care: Routine skin checks 7. Fluids/Electrolytes/Nutrition: Routine in and outs  Reordered daily multivitamin she takes at home 8.  Hypertension.  Monitor with increased mobility  Lisinopril 10, amlodipine 10 PTA   Lisinopril 5 started on 7/12, decreased to 2.5 on 7/17  Controlled on 7/20 9.  Hyperlipidemia.    Continue Lipitor 10.  Obesity.  BMI 35.56.  Follow-up dietary 11.  OSA.  Patient on CPAP at home. 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  Follow-up outpatient cardiology service 13. Dementia: Namenda 28 mg daily, Aricept 10 mg nightly. 14.  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  Mild hypoalbuminemia  Supplement initiated on 7/13  16.  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 Sleep disturbance  See #3  LOS: 8 days A FACE TO FACE EVALUATION WAS PERFORMED  Tavi Gaughran [name] 02/29/2020, 9:13 AM

## 2020-02-29 NOTE — Progress Notes (Signed)
Occupational Therapy Session Note  Patient Details  Name: CALAIS SVEHLA MRN: [id number] Date of Birth: 1941-02-29  Today's Date: 02/29/2020 OT Individual Time: 2706-2376 OT Individual Time Calculation (min): 28 min    Short Term Goals: Week 1:  OT Short Term Goal 1 (Week 1): Pt will transfer to [facility] with maxA of one person with LRAD. OT Short Term Goal 2 (Week 1): Pt will don shirt with minA and moderate cognitive cues. OT Short Term Goal 3 (Week 1): Pt will dress LB with maxA with LRAD to demonstrated improved dynamic standing balance. OT Short Term Goal 4 (Week 1): Pt will attend to items located on Lt side during grooming tasks with moderate cues.  Skilled Therapeutic Interventions/Progress Updates:    Treatment session with focus on unsupported sitting balance, Lt attention, and activity endurance to increase ADL independence. Pt received semi-reclined in bed reporting headache and eye pain. Pt resistant intimally to EOB activity, with encouragement pt reporting "I want to get up at the edge of the bed." Sat at EOB with minA to provide balance support and tactile cues to sequence. Pt sat at EOB, completing activity with cognitive component, facilitating Lt attention and increased balance challenge through set up. Provided min-modA due to fatigue to support sitting balance, providing multimodal cues to facilitate upright posture and midline orientation. Pt reporting fatigue, facilitated rest breaks and returned to supine with modA to assist with lifting legs on to bed. Ended session with pt semi-reclined in bed with bed alarm on and all needs within reach.  Therapy Documentation Precautions:  Precautions Precautions: Fall Restrictions Weight Bearing Restrictions: No General:   Vital Signs: Therapy Vitals Temp: 98.2 F (36.8 C) Temp Source: Oral Pulse Rate: (!) 41 BP: (!) 134/49 Patient Position (if appropriate): Lying Oxygen Therapy SpO2: 94 % O2 Device: Room Air Pain:   Chronic headache and back pain reported, repositioned with RN reporting providing pain medication  Therapy/Group: Individual Therapy  Blair Heys 02/29/2020, 7:17 AM

## 2020-02-29 NOTE — Progress Notes (Signed)
Speech Language Pathology Weekly Progress and Session Note  Patient Details  Name: [name] MRN: [id number] Date of Birth: 1941/06/21  Beginning of progress report period: February 23, 2020 End of progress report period: February 29, 2020  Today's Date: 02/29/2020 SLP Individual Time: 8338-2505 SLP Individual Time Calculation (min): 40 min  Short Term Goals: Week 1: SLP Short Term Goal 1 (Week 1): Pt will complete familar and functional problem solving tasks with min A verbal cues. SLP Short Term Goal 1 - Progress (Week 1): Met SLP Short Term Goal 2 (Week 1): Pt will self-monitor and self-correct errors in functional tasks with min A verbal cues. SLP Short Term Goal 2 - Progress (Week 1): Met SLP Short Term Goal 3 (Week 1): Pt will demonstrate sustained attention in 10 minute intervals with min A verbal cues for redirection during functional tasks. SLP Short Term Goal 3 - Progress (Week 1): Met SLP Short Term Goal 4 (Week 1): Pt will utilizie external aids to recall novel and daily information with mod A verbal cues. SLP Short Term Goal 4 - Progress (Week 1): Met SLP Short Term Goal 5 (Week 1): Pt will consume dys 2 textures and thin liquid diet with mod I use of swallow strategies. SLP Short Term Goal 5 - Progress (Week 1): Not met SLP Short Term Goal 6 (Week 1): Pt will consume trials of dys 3 textures over x3 sessions with apporpriate oral clearance and no overt s/s aspiration with supervision A verbal cues. SLP Short Term Goal 6 - Progress (Week 1): Not met    New Short Term Goals: Week 2: SLP Short Term Goal 1 (Week 2): Pt will complete familar and functional problem solving tasks with supervision A verbal cues. SLP Short Term Goal 2 (Week 2): Pt will self-monitor and self-correct errors in functional tasks with supervision A verbal cues. SLP Short Term Goal 3 (Week 2): Pt will demonstrate sustained attention in 15 minute intervals with min A verbal cues for redirection during  functional tasks. SLP Short Term Goal 4 (Week 2): Pt will utilizie external aids to recall novel and daily information with min A verbal cues. SLP Short Term Goal 5 (Week 2): Pt will consume dys 2 textures and thin liquid diet with supervision A verbal cues for use of swallow strategies. SLP Short Term Goal 6 (Week 2): Pt will utilizie speech intelligibility strategies at the phrase/sentence level to achieve 90% intelligibility with supervision A verbal cues.  Weekly Progress Updates:Pt made great progress meeting 4 out 6 goals. Majority of the treatment sessions this reporting period were focused on oral dysphagia. Pt continues to demonstrate difficulty with clearing oral residue and checking left buccal cavity during PO consumption of dys 2 textures. SLP added speech intelligibility goal to address mild dysarthria from left facial weakness and carryover of novel speech intelligibility strategies. Pt made progress meeting cognitive goals and initiated memory notebook, however tasks were limited due to pain. Pt continues to benfit from skills ST services in order to maximize functional independence and reduce burden of care, requiring 24 hour supervision and continued ST services.     Intensity: Minumum of 1-2 x/day, 30 to 90 minutes Frequency: 3 to 5 out of 7 days Duration/Length of Stay: 8/5 Treatment/Interventions: Cognitive remediation/compensation;Cueing hierarchy;Dysphagia/aspiration precaution training;Functional tasks;Internal/external aids;Patient/family education;Speech/Language facilitation   Daily Session  Skilled Therapeutic Interventions:  Skilled ST services focused on speech skills. SLP facilitated instruction of speech intelligibility strategies, over articulation and slow rate after pt expressed concern  about being understood. Pt is presently demonstrating 90% intelligibility at the sentence level with min A verbal cues for use of strategies. SLP added speech intelligibility goal  to target carryover of strategies. SLP also facilitated basic problem solving, error awareness and recall skills utilizing ALFA money management task, pt required supervision A verbal cues for recall and error awareness, further impacted by visual deficits. Pt was left in room with call bell within reach and bed alarm set. ST recommends to continue skilled ST services.     General    Pain Pain Assessment Pain Score: 0-No pain  Therapy/Group: Individual Therapy  Kanai Berrios  [facility] 02/29/2020, 5:09 PM

## 2020-02-29 NOTE — Progress Notes (Signed)
Physical Therapy Note Missed PT session (60 min)  Patient Details  Name: [name] MRN: [id number] Date of Birth: [birth year] Today's Date: 02/29/2020    Pt too lethargic this am for treatment.  Attempted to assist to sitting/pt refuses/mumbles "so tired"cannot open eyes sufficiently to communicate.  Will attempt to return if schedule permits.   Rada Hay, PT    Shearon Balo 02/29/2020, 4:46 PM

## 2020-03-01 ENCOUNTER — Inpatient Hospital Stay (HOSPITAL_COMMUNITY): Payer: Medicare Other

## 2020-03-01 ENCOUNTER — Inpatient Hospital Stay (HOSPITAL_COMMUNITY): Payer: Medicare Other | Admitting: Speech Pathology

## 2020-03-01 DIAGNOSIS — R001 Bradycardia, unspecified: Secondary | ICD-10-CM

## 2020-03-01 NOTE — Progress Notes (Signed)
Patient VS at 2000 last night results to MEWS score yellow due to no respirations recorder. Writer assessed and re-checked patient VS at 2030 and MEWS score is green with score of 1 due to heart rate is <60. Patient since admission is bradycardiac. Patient during assessment is alert and oriented x 4, reported feeling tired and sleepy but no pain or any discomfort voiced. Patient bed is in low position and call button is within reach. Will continue to monitor patient for any changes.

## 2020-03-01 NOTE — Progress Notes (Signed)
[place] PHYSICAL MEDICINE & REHABILITATION PROGRESS NOTE  Subjective/Complaints: Patient seen laying in bed this AM.  She states she slept well overnight.  She denies complaints.   ROS: Denies CP, SOB, N/V/D  Objective: Vital Signs: Blood pressure (!) 128/50, pulse (!) 54, temperature 97.7 F (36.5 C), temperature source Oral, resp. rate 14, height 5\' 2"  (1.575 m), weight 83.8 kg, SpO2 95 %. No results found. No results for input(s): WBC, HGB, HCT, PLT in the last 72 hours. Recent Labs    02/28/20 0526  CREATININE 0.86    Physical Exam: BP (!) 128/50 (BP Location: Left Arm)   Pulse (!) 54   Temp 97.7 F (36.5 C) (Oral)   Resp 14   Ht 5\' 2"  (1.575 m)   Wt 83.8 kg   SpO2 95%   BMI 33.79 kg/m   Constitutional: No distress . Vital signs reviewed. HENT: Normocephalic.  Atraumatic. Eyes: EOMI. No discharge. Cardiovascular: No JVD.  Respiratory: Normal effort.  No stridor. GI: Non-distended. Skin: Warm and dry.  Intact. Psych: Flat. Some delay. Musc: Bilateral lower extremity edema.  Neuro:  Alert Left > right limitations in gaze, stable Left cranial nerve VII palsy, ? some improvement Dysarthria, stable Motor: Bilateral upper extremities: 4+/5 proximal distal Left lower extremity: 4/5 hip flexion, knee extension, 4/5 ankle dorsiflexion, unchanged Right lower extremity: Hip flexion, knee extension 4/5, ankle dorsiflexion 4/5, unchanged  Assessment/Plan: 1. 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  Physiatrist is providing close team supervision and 24 hour management of active medical problems listed below.  Physiatrist and rehab team continue to assess barriers to discharge/monitor patient progress toward functional and medical goals  Care Tool:  Bathing  Bathing activity did not occur:  (N/A) Body parts bathed by patient:  Right arm, Face   Body parts bathed by helper: Right arm, Left arm, Chest, Abdomen, Face     Bathing assist Assist Level: Supervision/Verbal cueing     Upper Body Dressing/Undressing Upper body dressing   What is the patient wearing?: Pull over shirt    Upper body assist Assist Level: Minimal Assistance - Patient > 75%    Lower Body Dressing/Undressing Lower body dressing      What is the patient wearing?: Incontinence brief, Pants     Lower body assist Assist for lower body dressing: Maximal Assistance - Patient 25 - 49%     Toileting Toileting Toileting Activity did not occur (Clothing management and hygiene only): N/A (no void or bm)  Toileting assist Assist for toileting: 2 Helpers     Transfers Chair/bed transfer  Transfers assist     Chair/bed transfer assist level: Moderate Assistance - Patient 50 - 74%     Locomotion Ambulation   Ambulation assist   Ambulation activity did not occur: Safety/medical concerns (decreased strength/activity tolerance)  Assist level: Minimal Assistance - Patient > 75% Assistive device: Parallel bars Max distance: 8'   Walk 10 feet activity   Assist  Walk 10 feet activity did not occur: Safety/medical concerns (decreased strength activity tolerance)        Walk 50 feet activity   Assist Walk 50 feet with 2 turns activity did not occur: Safety/medical concerns (decreased strength activity tolerance)         Walk 150 feet activity   Assist Walk 150 feet activity did not occur: Safety/medical concerns  Walk 10 feet on uneven surface  activity   Assist Walk 10 feet on uneven surfaces activity did not occur: Safety/medical concerns         Wheelchair     Assist Will patient use wheelchair at discharge?: Yes (Per PT long term goals ) Type of Wheelchair: Manual    Wheelchair assist level: Supervision/Verbal cueing Max wheelchair distance: 80    Wheelchair 50 feet with 2 turns  activity    Assist        Assist Level: Supervision/Verbal cueing   Wheelchair 150 feet activity     Assist     Assist Level: Maximal Assistance - Patient 25 - 49%      Medical Problem List and Plan: 1.  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  Status post TPA.  Status post loop recorder placement  Continue CIR  Continue ophthalmic solution/ointments  Team conference today to discuss current and goals and coordination of care, home and environmental barriers, and discharge planning with nursing, case manager, and therapies. Please see conference note from today as well. 2.  Antithrombotics: -DVT/anticoagulation: Lovenox.  Vascular study negative             -antiplatelet therapy: Aspirin 81 mg daily and Plavix 85 mg daily x3 weeks then Plavix alone 3. Pain Management: Oxycodone 15 mg every 4 hours as needed chronic pain  Controlled with meds on 7/21  Monitor with increased exertion 4. Mood: Melatonin 3 mg nightly, Remeron 15 mg nightly as needed,  BuSpar 30 mg twice daily             -antipsychotic agents: N/A 5. Neuropsych: This patient is ?fully capable of making decisions on her own behalf. 6. Skin/Wound Care: Routine skin checks 7. Fluids/Electrolytes/Nutrition: Routine in and outs  Reordered daily multivitamin she takes at home 8.  Hypertension.  Monitor with increased mobility  Lisinopril 10, amlodipine 10 PTA   Lisinopril 5 started on 7/12, decreased to 2.5 on 7/17  Controlled on 7/21 9.  Hyperlipidemia.    Continue Lipitor 10.  Obesity.  BMI 35.56.  Follow-up dietary 11.  OSA.  Patient on CPAP at home. 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  Follow-up outpatient cardiology service 13. Dementia: Namenda 28 mg daily, Aricept 10 mg nightly. 14.  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  Mild hypoalbuminemia  Supplement initiated on 7/13  16.   Drug-induced constipation  Bowel meds increased on 7/15, increased again on 7/20, will consider change in medication tomorrow if no BM today 17.  Post stroke dysphagia  D2 thins, advance as tolerated 18. Sleep disturbance  See #3  Improving 19. Bradycardia  Discussed with pharmacy, no offending agents  ECG ordered  LOS: 9 days A FACE TO FACE EVALUATION WAS PERFORMED  Jenise Iannelli [name] 03/01/2020, 8:07 AM

## 2020-03-01 NOTE — Patient Care Conference (Signed)
Inpatient RehabilitationTeam Conference and Plan of Care Update Date: 03/01/2020   Time: 3:06 PM    Patient Name: [name]      Medical Record Number: [id number]  Date of Birth: 04-06-1941 Sex: Female         Room/Bed: 4W03C/4W03C-01 Payor Info: Payor: MEDICARE / Plan: MEDICARE PART A AND B / Product Type: *No Product type* /    Admit Date/Time:  02/21/2020  3:52 PM  Primary Diagnosis:  Stroke [facility])  Hospital Problems: Principal Problem:   Stroke (HCC) L>R pontine and L cerebellar embolic infarcts, source unknown s/p tPA Active Problems:   Left pontine cerebrovascular accident (HCC)   Hypoalbuminemia due to protein-calorie malnutrition (HCC)   Benign essential HTN   Slow transit constipation   Dysphagia, post-stroke   Stage 2 chronic kidney disease   Sleep disturbance   Drug induced constipation    Expected Discharge Date: Expected Discharge Date: 03/16/20  Team Members Present: Physician leading conference: Dr. Maryla Morrow Care Coodinator Present: Chana Bode, RN, BSN, CRRN;Christina Midway, BSW Nurse Present: Doran Durand, LPN PT Present: Raechel Chute, PT OT Present: Blanch Media, OT SLP Present: Suzzette Righter, CF-SLP PPS Coordinator present : Fae Pippin, SLP     Current Status/Progress Goal Weekly Team Focus  Bowel/Bladder   Patient is incontinent of bladder and continent of bowel  Obtain continence of bowel and bladder  q2-3 hours toileting and PRN to decrease incontinence   Swallow/Nutrition/ Hydration   dys 2 and thin liquids, min A to clear oral residue  Mod I  carryover of swallow strategies   ADL's   modA squat pivots & sit <> stands, UB ADLs min, LB ADLs max  minA mobility & ADLs, w/ sup groom & sit  ADl retraining, sitting and standing balance, functional transfers, Lt attention   Mobility   bed mobility mod A, transfers mod A, gait 32ft min/mod A (very unstable)  min A, supervision WC mobility  functional mobility/transfers, dynamic  standing balance/coordination, ambulation, endurance   Communication   Min A 90% intelligibile  Mod I  speech intelligibility strategies   Safety/Cognition/ Behavioral Observations  Min A  Mod I - basic  basic problem solving, error awareness, memory strategies and sustained attention   Pain   patient denies pain or discomfort  maintain pain free with or without activity  Q shift and PRN pain assessment   Skin   patient has no skin issues  maintain good skin condition and prevent further skin breakdown  q shift and PRN skin assessment     Team Discussion:  Discharge Planning/Teaching Needs:  Goal to discharge home with son and daughter in law to provide care  Will schedule with family if recommended   Current Update: on target  Current Barriers to Discharge:  Incontinence  Possible Resolutions to Barriers: Toileting protocol  Patient on target to meet rehab goals: yes, Minimal assistance overall  Progress impaired by poor endurance and limited participation with therapy sessions.  *See Care Plan and progress notes for long and short-term goals.   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  Status post TPA.  Status post loop recorder placement Weekly Focus/Goal: Improve mobility, bradycardia, swallowing, constipation, BP  Barriers to Discharge: Medical stability;Incontinence   Possible Resolutions to Barriers: Therapies, encourage fluids, follow up labs, optimize BP meds,  bowel meds increased, ECG, will consider Cards consult if necessary for bradycardia   Continued Need for Acute Rehabilitation Level of Care: The patient requires daily medical management by a physician with specialized training in physical medicine and rehabilitation for the following reasons: Direction of a  multidisciplinary physical rehabilitation program to maximize functional independence : Yes Medical management of patient stability for increased activity during participation in an intensive rehabilitation regime.: Yes Analysis of laboratory values and/or radiology reports with any subsequent need for medication adjustment and/or medical intervention. : Yes   I attest that I was present, lead the team conference, and concur with the assessment and plan of the team.   Chana Bode B 03/01/2020, 3:06 PM

## 2020-03-01 NOTE — Plan of Care (Signed)
  Problem: RH BOWEL ELIMINATION Goal: RH STG MANAGE BOWEL WITH ASSISTANCE Description: STG Manage Bowel with Min assist Outcome: Progressing Goal: RH STG MANAGE BOWEL W/MEDICATION W/ASSISTANCE Description: STG Manage Bowel with Medication with Mod I Assistance. Outcome: Progressing   Problem: RH BLADDER ELIMINATION Goal: RH STG MANAGE BLADDER WITH ASSISTANCE Description: STG Manage Bladder With mod I assist Outcome: Progressing

## 2020-03-01 NOTE — Progress Notes (Signed)
Physical Therapy Weekly Progress Note  Patient Details  Name: [name] MRN: [id number] Date of Birth: May 18, 1941  Beginning of progress report period: February 22, 2020 End of progress report period: March 01, 2020  Patient has met 2 of 4 short term goals. Pt demonstrates very slow progress towards long term goals and has been limited due to fatigue; pt was made 15/7 for therapy. Pt currently requires min/mod A for bed mobility, mod A for sit<>stand and stand<>pivot transfers with RW, and min A for gait up to 61f inside parallel bars. However, pt demonstrates bilateral knee buckling with gait and with extensive time in standing due to LE weakness.   Patient continues to demonstrate the following deficits muscle weakness, impaired timing and sequencing, unbalanced muscle activation, ataxia and decreased coordination and decreased standing balance, decreased postural control and decreased balance strategies and therefore will continue to benefit from skilled PT intervention to increase functional independence with mobility.  Patient progressing toward long term goals..  Continue plan of care.  PT Short Term Goals Week 1:  PT Short Term Goal 1 (Week 1): pt to perform bed mobility at min A x1 PT Short Term Goal 1 - Progress (Week 1): Progressing toward goal PT Short Term Goal 2 (Week 1): pt to perform STS transfers and stand pivot transfers at mod A x2 PT Short Term Goal 2 - Progress (Week 1): Met PT Short Term Goal 3 (Week 1): pt to initiate gait training PT Short Term Goal 3 - Progress (Week 1): Met PT Short Term Goal 4 (Week 1): pt to perform manual WC propulsion at mod A x1 for 150' PT Short Term Goal 4 - Progress (Week 1): Progressing toward goal Week 2:  PT Short Term Goal 1 (Week 2): Pt will perform bed mobility with min A PT Short Term Goal 2 (Week 2): Pt will transfer bed<>chair with LRAD min A PT Short Term Goal 3 (Week 2): Pt will ambulate 218fwith LRAD min A  Skilled  Therapeutic Interventions/Progress Updates:  Ambulation/gait training;Community reintegration;DME/adaptive equipment instruction;Neuromuscular re-education;Stair training;Psychosocial support;Wheelchair propulsion/positioning;UE/LE Strength taining/ROM;Balance/vestibular training;Discharge planning;Pain management;Skin care/wound management;Therapeutic Activities;UE/LE Coordination activities;Cognitive remediation/compensation;Disease management/prevention;Functional mobility training;Patient/family education;Splinting/orthotics;Therapeutic Exercise;Visual/perceptual remediation/compensation   Therapy Documentation Precautions:  Precautions Precautions: Fall Restrictions Weight Bearing Restrictions: No  Therapy/Group: Individual Therapy [name], DPT   03/01/2020, 7:33 AM

## 2020-03-01 NOTE — Progress Notes (Addendum)
Patient ID: [name], female   DOB: [birth year], 79 y.o.   MRN: [id number] Team Conference Report to Patient/Family  Team Conference discussion was reviewed with the patient and caregiver, including goals, any changes in plan of care and target discharge date.  Patient and caregiver express understanding and are in agreement.  The patient has a target discharge date of 03/16/20.   Sw left son a voicemail to provide information   Andria Rhein 03/01/2020, 1:36 PM

## 2020-03-01 NOTE — Progress Notes (Signed)
Occupational Therapy Session Note  Patient Details  Name: [name] MRN: [id number] Date of Birth: 12-18-1940  Today's Date: 03/01/2020 OT Individual Time: 2694-8546 OT Individual Time Calculation (min): 30 min    Short Term Goals: Week 1:  OT Short Term Goal 1 (Week 1): Pt will transfer to [facility] with maxA of one person with LRAD. OT Short Term Goal 2 (Week 1): Pt will don shirt with minA and moderate cognitive cues. OT Short Term Goal 3 (Week 1): Pt will dress LB with maxA with LRAD to demonstrated improved dynamic standing balance. OT Short Term Goal 4 (Week 1): Pt will attend to items located on Lt side during grooming tasks with moderate cues. Week 2:     Skilled Therapeutic Interventions/Progress Updates:    Treatment session with focus on ADL retraining and functional transfers. Pt received seated in w/c reporting fatigue but agreeable to therapy upon encouragement. Engaged in squat pivot transfers w/c <> toilet with minA with max cues to anteriorly and laterally weight shift. Educated pt on purpose of simulated dressing activity to increase ADL independence. Doffed and donned shirt with minA, with cueing to improve positioning for overhead lift to compensate for decreased trunk control and functional reach. Provided multimodal cues throughout session to increase midline orientation. Returned to bed with minA for the squat pivot transfer and assistance to lift LLE into bed due to fatigue and pain. Ended session with pt semi-reclined in bed with all needs within reach and bed alarm on.  Therapy Documentation Precautions:  Precautions Precautions: Fall Restrictions Weight Bearing Restrictions: No General:   Vital Signs:  Pain: Minimal headache pain, adjusted lighting  Therapy/Group: Individual Therapy  Blair Heys 03/01/2020, 12:23 PM

## 2020-03-01 NOTE — Progress Notes (Signed)
Physical Therapy Session Note  Patient Details  Name: [name] MRN: [id number] Date of Birth: 1940/10/22  Today's Date: 03/01/2020 PT Individual Time: 6389-3734 and 1500-1526  PT Individual Time Calculation (min): 57 min and 26 min  Short Term Goals: Week 1:  PT Short Term Goal 1 (Week 1): pt to perform bed mobility at min A x1 PT Short Term Goal 2 (Week 1): pt to perform STS transfers and stand pivot transfers at mod A x2 PT Short Term Goal 3 (Week 1): pt to initiate gait training PT Short Term Goal 4 (Week 1): pt to perform manual WC propulsion at mod A x1 for 150'  Skilled Therapeutic Interventions/Progress Updates:   Treatment Session 1: 0800-0857 57 min Received pt supine in bed, pt agreeable to therapy, and denied any pain during session. Session with emphasis on functional mobility/transfers, dressing, toileting, generalized strengthening, dynamic standing balance/coordination, and improved activity tolerance. No +2 available during session. Therapist located gauze and assisted with donning L eyepatch. Donned pants in supine with mod A and pt transferred supine<>sitting EOB with min A for trunk control. Pt with posterior lean with dynamic sitting balance while getting dressed requiring occasional min A and verbal cues for anterior weight shifting to correct. Doffed dirty shirt and donned clean shirt and sweater with min A and doffed slippers and donned stockings and shoes with max A for time management purposes. Pt reported urge to have BM and transferred bed<>bedside commode with RW with mod A from elevated bed. Pt required cues to turn completely prior to sitting and for RW safety. Doffed pants in standing with total A. Pt with liquid BM (RN notified), transferred sit<>stand with RW mod A, and required total A for peri-care and to pull pants/brief over hips. Pt transferred bedside commode<>WC with RW mod A but turned to sit too quickly resulting in lateral LOB into WC. Pt reported  "my legs felt too weak". Pt sat in [facility] and brushed teeth and washed face at sink with supervision. Pt performed the following exercises seated in [facility] with supervision and verbal cues for technique and for eccentric control: -hip flexion 2x12 bilaterally -knee extension 2x12 bilaterally -hip abduction with orange TB 2x12  Concluded session with pt sitting in WC, needs within reach, and seatbelt alarm on. NT present changing bedsheets and RN present administering medications. Therapist provided fresh ice water for pt.   Treatment Session 2: 1500-1526 26 min Received pt supine in bed, pt agreeable to therapy, and denied any pain during session but reported headache. RN notified and administered medication during session. Session with emphasis on functional mobility/transfers, generalized strengthening, dynamic standing balance/coordination, and improved activity tolerance. No +2 available during session. Pt transferred supine<>sitting EOB with min A with HOB elevated and cues for logroll technique. Donned shoes sitting EOB with max A for time management purposes. Pt transferred sit<>stand with RW x 4 trials throughout session with cues for hand placement on bed and on RW. Trial 1: pt unable to come to complete stand with max A due to bilateral LE weakness Trial 2: pt able to stand with mod A from elevated bed and perform x10 alternating marches (decreased hip/knee ROM) Trials 3/4: pt able to stand with mod A from elevated bed and perform x6 mini-squats with cues for technique and posture. Pt performed lateral side steps to L x5 to [facility] with RW and min/mod A with cues for upright posture. Doffed shoes sitting EOB with max A and pt transferred sit<>supine with min  A for LE management and scooted to [facility] with therapist holding LEs in hooklying position. Concluded session with pt supine in bed, needs within reach, and bed alarm on.   Therapy Documentation Precautions:  Precautions Precautions:  Fall Restrictions Weight Bearing Restrictions: No  Therapy/Group: Individual Therapy Martin Majestic PT, DPT   03/01/2020, 7:27 AM

## 2020-03-01 NOTE — Progress Notes (Signed)
Speech Language Pathology Daily Session Note  Patient Details  Name: [name] MRN: [id number] Date of Birth: 02-11-1941  Today's Date: 03/01/2020 SLP Individual Time: 0730-0759 SLP Individual Time Calculation (min): 29 min  Short Term Goals: Week 2: SLP Short Term Goal 1 (Week 2): Pt will complete familar and functional problem solving tasks with supervision A verbal cues. SLP Short Term Goal 2 (Week 2): Pt will self-monitor and self-correct errors in functional tasks with supervision A verbal cues. SLP Short Term Goal 3 (Week 2): Pt will demonstrate sustained attention in 15 minute intervals with min A verbal cues for redirection during functional tasks. SLP Short Term Goal 4 (Week 2): Pt will utilizie external aids to recall novel and daily information with min A verbal cues. SLP Short Term Goal 5 (Week 2): Pt will consume dys 2 textures and thin liquid diet with supervision A verbal cues for use of swallow strategies. SLP Short Term Goal 6 (Week 2): Pt will utilizie speech intelligibility strategies at the phrase/sentence level to achieve 90% intelligibility with supervision A verbal cues.  Skilled Therapeutic Interventions: Pt was seen for skilled ST targeting dysphagia. Pt with good verbal recall of oral phase deficits and strategies to compensate for buccal pocketing when prompted with Min A question cues. During consumption of current diet (dys 2/thin) breakfast tray, pt exhibited functional mastication and adequate oral clearance. One verbal cue provided for use of liquid wash to clear trace residue (of grits) from tongue. No overt s/sx aspiration noted across solid or liquid textures. Due to time constraints Dys 3 trial not attempted, however would recommend trial at next available session. Pt left laying in bed with alarm set and needs within reach. Continue per current plan of care.        Pain Pain Assessment Pain Scale: 0-10 Pain Score: 0-No pain  Therapy/Group:  Individual Therapy  Little Ishikawa 03/01/2020, 7:16 AM

## 2020-03-01 NOTE — Progress Notes (Signed)
Occupational Therapy Weekly Progress Note  Patient Details  Name: [name] MRN: [id number] Date of Birth: Jan 08, 1941  Beginning of progress report period: February 22, 2020 End of progress report period: March 01, 2020  Today's Date: 03/01/2020 OT Individual Time: 3893-7342 OT Individual Time Calculation (min): 30 min    Patient has met 3 of 4 short term goals. Pt is making steady progress toward goals. Pt is currently completing squat pivot transfers with modA, and completing functional sit <> stands with minA. Pt is able to don pants with minA for standing balance and assistance advancing pants over waist. Pt continues to require cues to scan to the L during ADL tasks. Pt's participation in therapy has fluctuated due to fluctuating fatigue, low mood, and pain levels. Pt would benefit from continued intervention targeting dynamic sitting and standing balance to increase ADL independence, and continued intervention to improve L inattention.   Patient continues to demonstrate the following deficits: muscle weakness, decreased cardiorespiratoy endurance, decreased visual perceptual skills, decreased midline orientation and decreased motor planning, decreased problem solving and decreased sitting balance and decreased standing balance and therefore will continue to benefit from skilled OT intervention to enhance overall performance with BADL.  Patient progressing toward long term goals..Continue plan of care.  OT Short Term Goals Week 1:  OT Short Term Goal 1 (Week 1): Pt will transfer to [facility] with maxA of one person with LRAD. OT Short Term Goal 1 - Progress (Week 1): Met OT Short Term Goal 2 (Week 1): Pt will don shirt with minA and moderate cognitive cues. OT Short Term Goal 2 - Progress (Week 1): Progressing toward goal OT Short Term Goal 3 (Week 1): Pt will dress LB with maxA with LRAD to demonstrated improved dynamic standing balance. OT Short Term Goal 3 - Progress (Week 1): Met OT  Short Term Goal 4 (Week 1): Pt will attend to items located on Lt side during grooming tasks with moderate cues. OT Short Term Goal 4 - Progress (Week 1): Met Week 2:  OT Short Term Goal 1 (Week 2): Pt will don pants with minA to demonstrsate increase dynamic standing balance. OT Short Term Goal 2 (Week 2): Pt will attend to L visual field during functional mobility with min cues. OT Short Term Goal 3 (Week 2): Pt will complete functional transfers with minA for 2 consecutive sessions. OT Short Term Goal 4 (Week 2): Pt will don shirt with minA and moderate cognitive cues.   Therapy Documentation Precautions:  Precautions Precautions: Fall Restrictions Weight Bearing Restrictions: No General:   Vital Signs: Therapy Vitals Temp: 97.7 F (36.5 C) Temp Source: Oral Pulse Rate: (!) 54 Resp: 14 BP: (!) 128/50 Patient Position (if appropriate): Lying Oxygen Therapy SpO2: 95 % O2 Device: Room Air  Therapy/Group: Individual Therapy  Michelle Nasuti 03/01/2020, 6:40 AM

## 2020-03-02 ENCOUNTER — Inpatient Hospital Stay (HOSPITAL_COMMUNITY): Payer: Medicare Other | Admitting: Speech Pathology

## 2020-03-02 ENCOUNTER — Inpatient Hospital Stay (HOSPITAL_COMMUNITY): Payer: Medicare Other | Admitting: Occupational Therapy

## 2020-03-02 ENCOUNTER — Inpatient Hospital Stay (HOSPITAL_COMMUNITY): Payer: Medicare Other

## 2020-03-02 DIAGNOSIS — I440 Atrioventricular block, first degree: Secondary | ICD-10-CM

## 2020-03-02 NOTE — Progress Notes (Signed)
Occupational Therapy Session Note  Patient Details  Name: [name] MRN: [id number] Date of Birth: 02-15-1941  Today's Date: 03/02/2020 OT Individual Time: 7867-5449 OT Individual Time Calculation (min): 30 min    Short Term Goals: Week 1:  OT Short Term Goal 1 (Week 1): Pt will transfer to [facility] with maxA of one person with LRAD. OT Short Term Goal 1 - Progress (Week 1): Met OT Short Term Goal 2 (Week 1): Pt will don shirt with minA and moderate cognitive cues. OT Short Term Goal 2 - Progress (Week 1): Progressing toward goal OT Short Term Goal 3 (Week 1): Pt will dress LB with maxA with LRAD to demonstrated improved dynamic standing balance. OT Short Term Goal 3 - Progress (Week 1): Met OT Short Term Goal 4 (Week 1): Pt will attend to items located on Lt side during grooming tasks with moderate cues. OT Short Term Goal 4 - Progress (Week 1): Met Week 2:  OT Short Term Goal 1 (Week 2): Pt will don pants with minA to demonstrsate increase dynamic standing balance. OT Short Term Goal 2 (Week 2): Pt will attend to L visual field during functional mobility with min cues. OT Short Term Goal 3 (Week 2): Pt will complete functional transfers with minA for 2 consecutive sessions. OT Short Term Goal 4 (Week 2): Pt will don shirt with minA and moderate cognitive cues.  Skilled Therapeutic Interventions/Progress Updates:    1:1 Pt was in bed when arrived. From a flat bed with bed rail pt able to come to EOB with supervision. At EOB pt was able to thread clean pants and don socks and shoes with contact guard to supervision. PT able to cross her Les to assist with threading her socks and pants. Pt perform sit to stand with min A and mod A to pull up pants. Mod A stand step transfer into the w/c. Taken into the bathroom and performed min a stand pivot transfer to and from the toilet. PT able to perform hygiene after voiding- but needed to be better cleaned from previous void on the bed pan with  assistance. Pt able to come up into standing with steadying A and maintain standing with grab bar while Ot assist with hygiene and clothing management. Returned to the w/c with min A with a for controlled decent.  Left sitting up in w/c eating yogurt with safety belt up in chair.  Therapy Documentation Precautions:  Precautions Precautions: Fall Restrictions Weight Bearing Restrictions: No Pain:  no c/o pain in session   Therapy/Group: Individual Therapy  Willeen Cass [facility] 03/02/2020, 3:44 PM

## 2020-03-02 NOTE — Progress Notes (Signed)
Speech Language Pathology Daily Session Note  Patient Details  Name: [name] MRN: [id number] Date of Birth: 1941-07-02  Today's Date: 03/02/2020 SLP Individual Time: 1000-1025 SLP Individual Time Calculation (min): 25 min  Short Term Goals: Week 2: SLP Short Term Goal 1 (Week 2): Pt will complete familar and functional problem solving tasks with supervision A verbal cues. SLP Short Term Goal 2 (Week 2): Pt will self-monitor and self-correct errors in functional tasks with supervision A verbal cues. SLP Short Term Goal 3 (Week 2): Pt will demonstrate sustained attention in 15 minute intervals with min A verbal cues for redirection during functional tasks. SLP Short Term Goal 4 (Week 2): Pt will utilizie external aids to recall novel and daily information with min A verbal cues. SLP Short Term Goal 5 (Week 2): Pt will consume dys 2 textures and thin liquid diet with supervision A verbal cues for use of swallow strategies. SLP Short Term Goal 6 (Week 2): Pt will utilizie speech intelligibility strategies at the phrase/sentence level to achieve 90% intelligibility with supervision A verbal cues.  Skilled Therapeutic Interventions: Skilled treatment session focused on dysphagia and cognitive goals. SLP facilitated session by providing upgraded trials of Dys.3 textures. Patient demonstrated efficient mastication with mild oral residue that patient independently cleared with a liquid wash. SLP provided cues to "chew big" due to a munching pattern at first, suspect due to texture of the graham cracker. Recommend trial tray prior to upgrade. SLP also facilitated session by providing Min A question cues for recall of events from previous therapy session. SLP recorded these events in her memory notebook. Patient left upright in the wheelchair with alarm on and all needs within reach. Continue with current plan of care.      Pain Pain Assessment Pain Scale: 0-10 Pain Score: 0-No  pain  Therapy/Group: Individual Therapy  [name] 03/02/2020, 11:41 AM

## 2020-03-02 NOTE — Progress Notes (Addendum)
[place] PHYSICAL MEDICINE & REHABILITATION PROGRESS NOTE  Subjective/Complaints: Patient seen sitting up in bed this morning.  She states she slept well overnight but is still tired this morning because she had very active dreams overnight.  ROS: Denies CP, SOB, N/V/D  Objective: Vital Signs: Blood pressure (!) 137/55, pulse (!) 56, temperature 98.2 F (36.8 C), resp. rate 14, height 5\' 2"  (1.575 m), weight 83.8 kg, SpO2 95 %. No results found. No results for input(s): WBC, HGB, HCT, PLT in the last 72 hours. No results for input(s): NA, K, CL, CO2, GLUCOSE, BUN, CREATININE, CALCIUM in the last 72 hours.  Physical Exam: BP (!) 137/55 (BP Location: Left Arm)   Pulse (!) 56   Temp 98.2 F (36.8 C)   Resp 14   Ht 5\' 2"  (1.575 m)   Wt 83.8 kg   SpO2 95%   BMI 33.79 kg/m   Constitutional: No distress . Vital signs reviewed. HENT: Normocephalic.  Atraumatic. Eyes: L>R limitation in gaze. No discharge. Cardiovascular: No JVD. Respiratory: Normal effort.  No stridor. GI: Non-distended. Skin: Warm and dry.  Intact. Psych: Flat, improving. Musc: Bilateral lower extreme edema. Neuro:  Alert Left cranial nerve VII palsy, ? some improvement Dysarthria, unchanged Motor: Bilateral upper extremities: 4+/5 proximal distal Left lower extremity: 4/5 hip flexion, knee extension, 4/5 ankle dorsiflexion, stable Right lower extremity: Hip flexion, knee extension 4/5, ankle dorsiflexion 4/5, stable  Assessment/Plan: 1. 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  Physiatrist is providing close team supervision and 24 hour management of active medical problems listed below.  Physiatrist and rehab team continue to assess barriers to discharge/monitor patient progress toward functional and medical goals  Care Tool:  Bathing  Bathing activity did not  occur:  (N/A) Body parts bathed by patient: Right arm, Face   Body parts bathed by helper: Right arm, Left arm, Chest, Abdomen, Face     Bathing assist Assist Level: Supervision/Verbal cueing     Upper Body Dressing/Undressing Upper body dressing   What is the patient wearing?: Pull over shirt    Upper body assist Assist Level: Minimal Assistance - Patient > 75%    Lower Body Dressing/Undressing Lower body dressing      What is the patient wearing?: Incontinence brief, Pants     Lower body assist Assist for lower body dressing: Maximal Assistance - Patient 25 - 49%     Toileting Toileting Toileting Activity did not occur (Clothing management and hygiene only): N/A (no void or bm)  Toileting assist Assist for toileting: Total Assistance - Patient < 25%     Transfers Chair/bed transfer  Transfers assist     Chair/bed transfer assist level: Moderate Assistance - Patient 50 - 74%     Locomotion Ambulation   Ambulation assist   Ambulation activity did not occur: Safety/medical concerns (decreased strength/activity tolerance)  Assist level: Minimal Assistance - Patient > 75% Assistive device: Parallel bars Max distance: 8'   Walk 10 feet activity   Assist  Walk 10 feet activity did not occur: Safety/medical concerns (decreased strength activity tolerance)        Walk 50 feet activity   Assist Walk 50 feet with 2 turns activity did not occur: Safety/medical concerns (decreased strength activity tolerance)         Walk 150 feet activity   Assist Walk 150 feet activity did not occur: Safety/medical concerns  Walk 10 feet on uneven surface  activity   Assist Walk 10 feet on uneven surfaces activity did not occur: Safety/medical concerns         Wheelchair     Assist Will patient use wheelchair at discharge?: Yes (Per PT long term goals ) Type of Wheelchair: Manual    Wheelchair assist level: Supervision/Verbal cueing Max  wheelchair distance: 80    Wheelchair 50 feet with 2 turns activity    Assist        Assist Level: Supervision/Verbal cueing   Wheelchair 150 feet activity     Assist     Assist Level: Maximal Assistance - Patient 25 - 49%      Medical Problem List and Plan: 1.  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  Status post TPA.  Status post loop recorder placement  Continue CIR  Continue ophthalmic solution/ointments 2.  Antithrombotics: -DVT/anticoagulation: Lovenox.  Vascular study negative             -antiplatelet therapy: Aspirin 81 mg daily and Plavix 85 mg daily x3 weeks then Plavix alone 3. Pain Management: Oxycodone 15 mg every 4 hours as needed chronic pain  Controlled with meds on 7/22  Monitor with increased exertion 4. Mood: Melatonin 3 mg nightly, Remeron 15 mg nightly as needed,  BuSpar 30 mg twice daily             -antipsychotic agents: N/A 5. Neuropsych: This patient is ?fully capable of making decisions on her own behalf. 6. Skin/Wound Care: Routine skin checks 7. Fluids/Electrolytes/Nutrition: Routine in and outs  Reordered daily multivitamin she takes at home 8.  Hypertension.  Monitor with increased mobility  Lisinopril 10, amlodipine 10 PTA   Lisinopril 5 started on 7/12, decreased to 2.5 on 7/17  Controlled on 7/22 9.  Hyperlipidemia.    Continue Lipitor 10.  Obesity.  BMI 35.56.  Follow-up dietary 11.  OSA.  Patient on CPAP at home. 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  Follow-up outpatient cardiology service 13. Dementia: Namenda 28 mg daily, Aricept 10 mg nightly. 14.  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  Mild hypoalbuminemia  Supplement initiated on 7/13  16.  Drug-induced constipation  Bowel meds increased on 7/15, increased again on 7/20, will consider change in medication tomorrow if no BM today  Improving 17.  Post  stroke dysphagia  D2 thins, advance as tolerated 18. Sleep disturbance  See #3  Improving 19. Bradycardia  Discussed with pharmacy, no offending agents  ECG reviewed, showing bradycardia with first-degree AV block with ?subacute infarct. No complaints of pain  Will ask cards to evaluate  LOS: 10 days A FACE TO FACE EVALUATION WAS PERFORMED  [name] [name] 03/02/2020, 9:32 AM

## 2020-03-02 NOTE — Plan of Care (Signed)
  Problem: RH BOWEL ELIMINATION Goal: RH STG MANAGE BOWEL WITH ASSISTANCE Description: STG Manage Bowel with Min assist Outcome: Progressing Goal: RH STG MANAGE BOWEL W/MEDICATION W/ASSISTANCE Description: STG Manage Bowel with Medication with Mod I Assistance. Outcome: Progressing   Problem: RH BLADDER ELIMINATION Goal: RH STG MANAGE BLADDER WITH ASSISTANCE Description: STG Manage Bladder With mod I assist Outcome: Progressing Goal: RH STG MANAGE BLADDER WITH MEDICATION WITH ASSISTANCE Description: STG Manage Bladder With Medication With Mod I Assistance. Outcome: Progressing Goal: RH STG MANAGE BLADDER WITH EQUIPMENT WITH ASSISTANCE Description: STG Manage Bladder With Equipment With Assistance Outcome: Progressing   Problem: RH SKIN INTEGRITY Goal: RH STG SKIN FREE OF INFECTION/BREAKDOWN Description: Min assist to maintain skin integrity Outcome: Progressing   Problem: RH SAFETY Goal: RH STG ADHERE TO SAFETY PRECAUTIONS W/ASSISTANCE/DEVICE Description: STG Adhere to Safety Precautions With supervision  Outcome: Progressing Goal: RH STG DECREASED RISK OF FALL WITH ASSISTANCE Description: STG Decreased Risk of Fall With mod I. Outcome: Progressing   Problem: RH COGNITION-NURSING Goal: RH STG USES MEMORY AIDS/STRATEGIES W/ASSIST TO PROBLEM SOLVE Description: STG Uses Memory Aids/Strategies With min assist to Problem Solve. Outcome: Progressing Goal: RH STG ANTICIPATES NEEDS/CALLS FOR ASSIST W/ASSIST/CUES Description: STG Anticipates Needs/Calls for Assist With  min Assistance/Cues. Outcome: Progressing   Problem: RH PAIN MANAGEMENT Goal: RH STG PAIN MANAGED AT OR BELOW PT'S PAIN GOAL Description: At or below level 4 Outcome: Progressing   Problem: RH KNOWLEDGE DEFICIT Goal: RH STG INCREASE KNOWLEDGE OF DIABETES Outcome: Progressing Goal: RH STG INCREASE KNOWLEDGE OF HYPERTENSION Description: Patient and family will be able to manage HTN using medications, dietary  restrictions recommended with handouts and educational materials with cues/reminders Outcome: Progressing Goal: RH STG INCREASE KNOWLEDGE OF DYSPHAGIA/FLUID INTAKE Description: Patient and family will be able to manage dysphagia and dietary restrictions independently using handouts. And educational information Outcome: Progressing Goal: RH STG INCREASE KNOWLEGDE OF HYPERLIPIDEMIA Description: Patient and family will be able to manage HLD using medications, dietary restrictions recommended with handouts and educational materials with cues/reminders Outcome: Progressing Goal: RH STG INCREASE KNOWLEDGE OF STROKE PROPHYLAXIS Description: Patient and family will be able to manage secondary stroke prevention using medications, dietary restrictions recommended with handouts and educational materials with cues/reminders Outcome: Progressing   Problem: Consults Goal: RH STROKE PATIENT EDUCATION Description: See Patient Education module for education specifics  Outcome: Progressing   Problem: RH Vision Goal: RH LTG Vision (Specify) Outcome: Progressing

## 2020-03-02 NOTE — Progress Notes (Signed)
Patient ID: [name], female   DOB: 11/03/1940, 79 y.o.   MRN: [id number]   SW attempted to call son again to provide team conference updates, no answer - left voicemail.

## 2020-03-02 NOTE — Progress Notes (Signed)
Physical Therapy Session Note  Patient Details  Name: [name] MRN: [id number] Date of Birth: [birth year]  Today's Date: 03/02/2020 PT Individual Time: 1300-1356 PT Individual Time Calculation (min): 56 min   Short Term Goals: Week 1:  PT Short Term Goal 1 (Week 1): pt to perform bed mobility at min A x1 PT Short Term Goal 1 - Progress (Week 1): Progressing toward goal PT Short Term Goal 2 (Week 1): pt to perform STS transfers and stand pivot transfers at mod A x2 PT Short Term Goal 2 - Progress (Week 1): Met PT Short Term Goal 3 (Week 1): pt to initiate gait training PT Short Term Goal 3 - Progress (Week 1): Met PT Short Term Goal 4 (Week 1): pt to perform manual WC propulsion at mod A x1 for 150' PT Short Term Goal 4 - Progress (Week 1): Progressing toward goal Week 2:  PT Short Term Goal 1 (Week 2): Pt will perform bed mobility with min A PT Short Term Goal 2 (Week 2): Pt will transfer bed<>chair with LRAD min A PT Short Term Goal 3 (Week 2): Pt will ambulate 12f with LRAD min A  Skilled Therapeutic Interventions/Progress Updates:   Received pt supine in bed, pt agreeable to therapy, and denied any pain during session. Session with emphasis on dressing, functional mobility/transfers, generalized strengthening, dynamic standing balance/coordination, ambulation, and improved activity tolerance. PT assisted with locating gauze and placing eyepatch over pt's L eye. Pt donned pants in supine with mod A and rolled to L and R with supervision. Pt transferred supine<>sitting EOB with HOB elevated and CGA. Donned shoes sitting EOB with max A for time management purposes. Pt transferred bed<>WC stand<>pivot with RW mod A from elevated bed with cues for RW safety and stepping sequence when turning. Pt performed WC mobility [id number] bilateral UEs and supervision and transported remainder [facility] way [facility]. Pt performed bilateral LE strengthening on Kinetron at 20 cm/sec 2x20  with back support and 1x20 without back support all with supervision. Pt transferred sit<>stand with RW min A and ambulated [id number] 1 and [id number] with RW and min/mod A +2 for close WC follow. Pt demonstrated mild bilateral LE ataxia, flexed trunk, and decreased stride length and required cues for upright posture and to push through LEs; no knee buckling noted but pt with 2 minor LOB requiring mod A to correct. Pt performed 5x sit<>stand with RW and min A in 62 seconds. Pt educated on significance of test and verbalized understanding. Pt reported 8/10 fatigue at end of session and requested to return to bed. [facility] and transferred WC<>bed stand<>pivot with RW min A and doffed shoes sitting EOB with total A. Sit<>supine with CGA. Concluded session with pt supine in bed, needs within reach, and bed alarm on.   Therapy Documentation Precautions:  Precautions Precautions: Fall Restrictions Weight Bearing Restrictions: No  Therapy/Group: Individual Therapy AnnaAlfonse Alpers DPT   03/02/2020, 7:29 AM

## 2020-03-03 ENCOUNTER — Inpatient Hospital Stay (HOSPITAL_COMMUNITY): Payer: Medicare Other | Admitting: Occupational Therapy

## 2020-03-03 ENCOUNTER — Inpatient Hospital Stay (HOSPITAL_COMMUNITY): Payer: Medicare Other

## 2020-03-03 NOTE — Progress Notes (Signed)
Occupational Therapy Session Note  Patient Details  Name: [name] MRN: [id number] Date of Birth: [birth year]  Today's Date: 03/03/2020 OT Individual Time: 1300-1345 OT Individual Time Calculation (min): 45 min    Short Term Goals: Week 2:  OT Short Term Goal 1 (Week 2): Pt will don pants with minA to demonstrsate increase dynamic standing balance. OT Short Term Goal 2 (Week 2): Pt will attend to L visual field during functional mobility with min cues. OT Short Term Goal 3 (Week 2): Pt will complete functional transfers with minA for 2 consecutive sessions. OT Short Term Goal 4 (Week 2): Pt will don shirt with minA and moderate cognitive cues.  Skilled Therapeutic Interventions/Progress Updates:    Treatment session with focus on ADL retraining, Lt attention and dynamic standing balance to improve balance and awareness for functional mobility. Pt received seated in w/c reporting urgent need to toilet. Complete stand pivot transfer with RW with minA. Pt completed perineal care and clothing management via lateral leans and partial sit <> stand with minA. Pt completed stand pivot transfer with RW with minA to mat. Engaged in activity standing with RW with CGA, incorporating weight shifting and function reach laterally to the Lt. Activity requiring scanning of L visual field, with pt requiring cues to recognize errors. Pt reporting fatigue, complete activity seated to facilitate anterior weight shift and challenge sitting balance. Completed stand pivot transfers with RW with minA to w/c and bed. Ended session with pt seated in w/c with seat alarm on and all needs within reach.  Therapy Documentation Precautions:  Precautions Precautions: Fall Restrictions Weight Bearing Restrictions: No General:   Vital Signs:  Pain: Minimal back pain reported, repositioned  Therapy/Group: Individual Therapy  Blair Heys 03/03/2020, 3:07 PM

## 2020-03-03 NOTE — Progress Notes (Signed)
Patient ID: [name], female   DOB: [birth year], 79 y.o.   MRN: [id number]  This SW covering for assigned SW, Lavera Guise.  SW called pt son [name] [phone]) to provide updates from team conference. SW encouraged to call back as he was driving. SW to follow-up.  *SW received return phone call from pt son. SW provided updates to son and wife on d/c date 03/16/2020. SW informed current recommendations show HHPT/OT/SLP. SW to leave HHA list.  SW left HHA list in room.   Cecile Sheerer, MSW, LCSWA Office: [phone] Cell: [phone] Fax: [phone]

## 2020-03-03 NOTE — Progress Notes (Signed)
[name] PHYSICAL MEDICINE & REHABILITATION PROGRESS NOTE  Subjective/Complaints: Patient seen laying in bed this morning.  He states he slept well overnight.  She notes her bottom is sore, but improved with repositioning in bed.  ROS: Denies CP, SOB, N/V/D  Objective: Vital Signs: Blood pressure (!) 134/51, pulse (!) 42, temperature 97.6 F (36.4 C), temperature source Oral, resp. rate 16, height 5\' 2"  (1.575 m), weight 83.8 kg, SpO2 96 %. No results found. No results for input(s): WBC, HGB, HCT, PLT in the last 72 hours. No results for input(s): NA, K, CL, CO2, GLUCOSE, BUN, CREATININE, CALCIUM in the last 72 hours.  Physical Exam: BP (!) 134/51   Pulse (!) 42   Temp 97.6 F (36.4 C) (Oral)   Resp 16   Ht 5\' 2"  (1.575 m)   Wt 83.8 kg   SpO2 96%   BMI 33.79 kg/m   Constitutional: No distress . Vital signs reviewed. HENT: Normocephalic.  Atraumatic. Eyes: L>R limitations in gaze.  No discharge. Cardiovascular: No JVD. Respiratory: Normal effort.  No stridor. GI: Non-distended. Skin: Warm and dry.  Intact. Psych: Flat. Musc: Bilateral lower extremity edema stable.  No tenderness. Neuro:  Alert Left cranial nerve VII palsy, ? some improvement Dysarthria, stable Motor: Bilateral upper extremities: 4+/5 proximal distal Left lower extremity: 4/5 hip flexion, knee extension, 4/5 ankle dorsiflexion, unchanged Right lower extremity: Hip flexion, knee extension 4/5, ankle dorsiflexion 4/5, unchanged  Assessment/Plan: 1. 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  Physiatrist is providing close team supervision and 24 hour management of active medical problems listed below.  Physiatrist and rehab team continue to assess barriers to discharge/monitor patient progress toward functional and medical goals  Care Tool:  Bathing  Bathing  activity did not occur:  (N/A) Body parts bathed by patient: Right arm, Face   Body parts bathed by helper: Right arm, Left arm, Chest, Abdomen, Face     Bathing assist Assist Level: Supervision/Verbal cueing     Upper Body Dressing/Undressing Upper body dressing   What is the patient wearing?: Pull over shirt    Upper body assist Assist Level: Minimal Assistance - Patient > 75%    Lower Body Dressing/Undressing Lower body dressing      What is the patient wearing?: Incontinence brief, Pants     Lower body assist Assist for lower body dressing: Maximal Assistance - Patient 25 - 49%     Toileting Toileting Toileting Activity did not occur (Clothing management and hygiene only): N/A (no void or bm)  Toileting assist Assist for toileting: Total Assistance - Patient < 25%     Transfers Chair/bed transfer  Transfers assist     Chair/bed transfer assist level: Moderate Assistance - Patient 50 - 74%     Locomotion Ambulation   Ambulation assist   Ambulation activity did not occur: Safety/medical concerns (decreased strength/activity tolerance)  Assist level: Moderate Assistance - Patient 50 - 74% Assistive device: Walker-rolling Max distance: 72ft   Walk 10 feet activity   Assist  Walk 10 feet activity did not occur: Safety/medical concerns (decreased strength activity tolerance)  Assist level: Moderate Assistance - Patient - 50 - 74% Assistive device: Walker-rolling   Walk 50 feet activity   Assist Walk 50 feet with 2 turns activity did not occur: Safety/medical concerns (decreased strength activity tolerance)         Walk 150 feet activity  Assist Walk 150 feet activity did not occur: Safety/medical concerns         Walk 10 feet on uneven surface  activity   Assist Walk 10 feet on uneven surfaces activity did not occur: Safety/medical concerns         Wheelchair     Assist Will patient use wheelchair at discharge?: Yes Type of  Wheelchair: Manual    Wheelchair assist level: Supervision/Verbal cueing Max wheelchair distance: 47ft    Wheelchair 50 feet with 2 turns activity    Assist        Assist Level: Supervision/Verbal cueing   Wheelchair 150 feet activity     Assist     Assist Level: Maximal Assistance - Patient 25 - 49%      Medical Problem List and Plan: 1.  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  Status post TPA.  Status post loop recorder placement  Continue CIR  Continue ophthalmic solution/ointments 2.  Antithrombotics: -DVT/anticoagulation: Lovenox.  Vascular study negative             -antiplatelet therapy: Aspirin 81 mg daily and Plavix 85 mg daily x3 weeks then Plavix alone 3. Pain Management: Oxycodone 15 mg every 4 hours as needed chronic pain  Controlled with meds on 7/23  Monitor with increased exertion 4. Mood: Melatonin 3 mg nightly, Remeron 15 mg nightly as needed,  BuSpar 30 mg twice daily             -antipsychotic agents: N/A 5. Neuropsych: This patient is ?fully capable of making decisions on her own behalf. 6. Skin/Wound Care: Routine skin checks 7. Fluids/Electrolytes/Nutrition: Routine in and outs  Reordered daily multivitamin she takes at home 8.  Hypertension.  Monitor with increased mobility  Lisinopril 10, amlodipine 10 PTA   Lisinopril 5 started on 7/12, decreased to 2.5 on 7/17  Relatively controlled on 7/23 9.  Hyperlipidemia.    Continue Lipitor 10.  Obesity.  BMI 35.56.  Follow-up dietary 11.  OSA.  Patient on CPAP at home. 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  Follow-up outpatient cardiology service 13. Dementia: Namenda 28 mg daily, Aricept 10 mg nightly. 14.  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  Mild hypoalbuminemia  Supplement initiated on 7/13  16.  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 Sleep disturbance  See #3  Improving 19. Bradycardia  Discussed with pharmacy, no offending agents  ECG reviewed, showing bradycardia with first-degree AV block with ?subacute infarct. No complaints of pain  Await Cards input  LOS: 11 days A FACE TO FACE EVALUATION WAS PERFORMED  [name] 03/03/2020, 10:31 AM

## 2020-03-03 NOTE — Progress Notes (Signed)
Physical Therapy Session Note  Patient Details  Name: [name] MRN: [id number] Date of Birth: Feb 18, 1941  Today's Date: 03/03/2020 PT Individual Time: 1000-1055 and 4034-7425  PT Individual Time Calculation (min): 55 min and 42 min  Short Term Goals: Week 1:  PT Short Term Goal 1 (Week 1): pt to perform bed mobility at min A x1 PT Short Term Goal 1 - Progress (Week 1): Progressing toward goal PT Short Term Goal 2 (Week 1): pt to perform STS transfers and stand pivot transfers at mod A x2 PT Short Term Goal 2 - Progress (Week 1): Met PT Short Term Goal 3 (Week 1): pt to initiate gait training PT Short Term Goal 3 - Progress (Week 1): Met PT Short Term Goal 4 (Week 1): pt to perform manual WC propulsion at mod A x1 for 150' PT Short Term Goal 4 - Progress (Week 1): Progressing toward goal Week 2:  PT Short Term Goal 1 (Week 2): Pt will perform bed mobility with min A PT Short Term Goal 2 (Week 2): Pt will transfer bed<>chair with LRAD min A PT Short Term Goal 3 (Week 2): Pt will ambulate 62f with LRAD min A  Skilled Therapeutic Interventions/Progress Updates:   Treatment Session 1: 1000-1055 55 min Received pt supine in bed, pt agreeable to therapy, and denied any pain during session. Session with emphasis on dressing, functional mobility/transfers, generalized strengthening, dynamic standing balance/coordination, ambulation, and improved activity tolerance. Doffed socks and donned stockings with total A for time management. Donned pants in supine with mod A and transferred supine<>sitting EOB with supervision using bedrails. Donned shoes sitting EOB with max A and doffed dirty shirt and donned clean one with min/mod A. Pt occasionally requiring min A to maintain sitting balance. Donned bonnet with supervision and therapist assisted with positioning L eyepatch. Pt transferred bed<>WC stand<>pivot with RW min A from elevated bed with cues for turning technique and brushed teeth and  washed face sitting in WC at sink with supervision. Pt performed WC mobility 1252fusing bilateral UEs and supervision to therapy gym. Pt ambulated 3316f 1 and [id number] with RW and min/mod A. Pt demonstrated flexed trunk, decreased stride length, and decreased trunk rotation and required verbal cues for upright posture and RW safety when turning to sit. Pt transferred sit<>stand with RW min A and performed alternating toe taps to 3in step 2x15 with min/mod A and cues for technique. Pt transferred mat<>WC stand<>pivot with RW min A and [facility]. Concluded session with pt sitting in WC, needs within reach, and chair pad alarm on.   Treatment Session 2: 1445-1527 42 min Received pt sitting in WC, pt agreeable to therapy, and reported 8/10 low back pain (premedicated). Repositioning, rest breaks, and distraction done to reduce pain. Therapist provided pt with hot packs during session and pt reported pain relief. Session with emphasis on functional mobility/transfers, generalized strengthening, dynamic standing balance/coordination, ambulation, and improved activity tolerance. [facility] for [facility] management purposes. Pt ambulated 23ft77f trials with RW min/mod A. Pt required cues for upright posture and to increase stride length. Worked on dynamic standing balance playing cornhole with RW x 3 trials with min A for balance with cues for upright posture. Pt required multiple rest breaks throughout session due to fatigue and back pain. [facility] and transferred WC<>bed stand<>pivot with RW min A and doffed  shoes and stockings siting EOB total A and donned slipper socks total A. Sit<>supine with supervision. Concluded session with pt supine in bed, needs within reach, and bed alarm on. Therapist assisted with repositioning with pillow for pressure relief.    Therapy Documentation Precautions:  Precautions Precautions:  Fall Restrictions Weight Bearing Restrictions: No  Therapy/Group: Individual Therapy Alfonse Alpers PT, DPT   03/03/2020, 7:28 AM

## 2020-03-04 ENCOUNTER — Inpatient Hospital Stay (HOSPITAL_COMMUNITY): Payer: Medicare Other | Admitting: Physical Therapy

## 2020-03-04 ENCOUNTER — Inpatient Hospital Stay (HOSPITAL_COMMUNITY): Payer: Medicare Other

## 2020-03-04 ENCOUNTER — Inpatient Hospital Stay (HOSPITAL_COMMUNITY): Payer: Medicare Other | Admitting: Speech Pathology

## 2020-03-04 NOTE — Progress Notes (Signed)
[facility] PHYSICAL MEDICINE & REHABILITATION PROGRESS NOTE  Subjective/Complaints: Patient seen laying in bed this morning.  She states she did not sleep well overnight, but for no particular reason.  She is worried about not being able to sleep tonight either.  ROS: Denies CP, SOB, N/V/D  Objective: Vital Signs: Blood pressure (!) 114/55, pulse 46, temperature 97.7 F (36.5 C), resp. rate 16, height 5\' 2"  (1.575 m), weight 83.8 kg, SpO2 99 %. No results found. No results for input(s): WBC, HGB, HCT, PLT in the last 72 hours. No results for input(s): NA, K, CL, CO2, GLUCOSE, BUN, CREATININE, CALCIUM in the last 72 hours.  Physical Exam: BP (!) 114/55 (BP Location: Left Wrist)   Pulse 46   Temp 97.7 F (36.5 C)   Resp 16   Ht 5\' 2"  (1.575 m)   Wt 83.8 kg   SpO2 99%   BMI 33.79 kg/m   Constitutional: No distress . Vital signs reviewed. HENT: Normocephalic.  Atraumatic. Eyes: Left > right limitations in gaze.  No discharge. Cardiovascular: No JVD. Respiratory: Normal effort.  No stridor. GI: Non-distended. Skin: Warm and dry.  Intact. Psych: Flat. Musc: Bilateral lower extremities with edema, stable.  No tenderness. Neuro: Alert Left cranial nerve VII palsy, ? some improvement Dysarthria, unchanged Motor: Bilateral upper extremities: 4+/5 proximal distal Left lower extremity: 4/5 hip flexion, knee extension, 4/5 ankle dorsiflexion, unchanged Right lower extremity: Hip flexion, knee extension 4/5, ankle dorsiflexion 4/5, stable  Assessment/Plan: 1. 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  Physiatrist is providing close team supervision and 24 hour management of active medical problems listed below.  Physiatrist and rehab team continue to assess barriers to discharge/monitor patient progress toward functional and medical goals  Care  Tool:  Bathing  Bathing activity did not occur:  (N/A) Body parts bathed by patient: Right arm, Face   Body parts bathed by helper: Right arm, Left arm, Chest, Abdomen, Face     Bathing assist Assist Level: Supervision/Verbal cueing     Upper Body Dressing/Undressing Upper body dressing   What is the patient wearing?: Pull over shirt    Upper body assist Assist Level: Minimal Assistance - Patient > 75%    Lower Body Dressing/Undressing Lower body dressing      What is the patient wearing?: Incontinence brief, Pants     Lower body assist Assist for lower body dressing: Maximal Assistance - Patient 25 - 49%     Toileting Toileting Toileting Activity did not occur (Clothing management and hygiene only): N/A (no void or bm)  Toileting assist Assist for toileting: Minimal Assistance - Patient > 75%     Transfers Chair/bed transfer  Transfers assist     Chair/bed transfer assist level: Minimal Assistance - Patient > 75%     Locomotion Ambulation   Ambulation assist   Ambulation activity did not occur: Safety/medical concerns (decreased strength/activity tolerance)  Assist level: Moderate Assistance - Patient 50 - 74% Assistive device: Walker-rolling Max distance: 78ft   Walk 10 feet activity   Assist  Walk 10 feet activity did not occur: Safety/medical concerns (decreased strength activity tolerance)  Assist level: Moderate Assistance - Patient - 50 - 74% Assistive device: Walker-rolling   Walk 50 feet activity   Assist Walk 50 feet with 2 turns activity did not occur: Safety/medical concerns (decreased strength activity tolerance)  Walk 150 feet activity   Assist Walk 150 feet activity did not occur: Safety/medical concerns         Walk 10 feet on uneven surface  activity   Assist Walk 10 feet on uneven surfaces activity did not occur: Safety/medical concerns         Wheelchair     Assist Will patient use wheelchair at  discharge?: Yes Type of Wheelchair: Manual    Wheelchair assist level: Supervision/Verbal cueing Max wheelchair distance: 123ft    Wheelchair 50 feet with 2 turns activity    Assist        Assist Level: Supervision/Verbal cueing   Wheelchair 150 feet activity     Assist     Assist Level: Maximal Assistance - Patient 25 - 49%      Medical Problem List and Plan: 1.  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  Status post TPA.  Status post loop recorder placement  Continue CIR  Continue ophthalmic solution/ointments 2.  Antithrombotics: -DVT/anticoagulation: Lovenox.  Vascular study negative             -antiplatelet therapy: Aspirin 81 mg daily and Plavix 85 mg daily x3 weeks then Plavix alone 3. Pain Management: Oxycodone 15 mg every 4 hours as needed chronic pain  Controlled with meds on 7/24  Monitor with increased exertion 4. Mood: Melatonin 3 mg nightly, Remeron 15 mg nightly as needed,  BuSpar 30 mg twice daily             -antipsychotic agents: N/A 5. Neuropsych: This patient is ?fully capable of making decisions on her own behalf. 6. Skin/Wound Care: Routine skin checks 7. Fluids/Electrolytes/Nutrition: Routine in and outs  Reordered daily multivitamin she takes at home 8.  Hypertension.  Monitor with increased mobility  Lisinopril 10, amlodipine 10 PTA   Lisinopril 5 started on 7/12, decreased to 2.5 on 7/17  Relatively controlled on 7/24 9.  Hyperlipidemia.    Continue Lipitor 10.  Obesity.  BMI 35.56.  Follow-up dietary 11.  OSA.  Patient on CPAP at home. 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  Follow-up outpatient cardiology service 13. Dementia: Namenda 28 mg daily, Aricept 10 mg nightly. 14.  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  Mild hypoalbuminemia  Supplement initiated on 7/13  16.  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 Sleep disturbance  See #3  Did not sleep well on 7/24, however patient has intermittent nights at baseline with poor sleep 19. Bradycardia  Discussed with pharmacy, no offending agents  ECG reviewed, showing bradycardia with first-degree AV block with ?subacute infarct. No complaints of pain  Continue to await cards input  LOS: 12 days A FACE TO FACE EVALUATION WAS PERFORMED  Giovanie Lefebre [name] 03/04/2020, 4:32 PM

## 2020-03-04 NOTE — Progress Notes (Signed)
Occupational Therapy Session Note  Patient Details  Name: [name] MRN: [id number] Date of Birth: Jul 23, 1941  Today's Date: 03/04/2020 OT Individual Time: 0930-1015 OT Individual Time Calculation (min): 45 min    Short Term Goals: Week 2:  OT Short Term Goal 1 (Week 2): Pt will don pants with minA to demonstrsate increase dynamic standing balance. OT Short Term Goal 2 (Week 2): Pt will attend to L visual field during functional mobility with min cues. OT Short Term Goal 3 (Week 2): Pt will complete functional transfers with minA for 2 consecutive sessions. OT Short Term Goal 4 (Week 2): Pt will don shirt with minA and moderate cognitive cues.  Skilled Therapeutic Interventions/Progress Updates:    OT session focused on sit<>stand, postural control, activity tolerance, and strengthening. Pt received supine in bed reporting max fatigue due to not sleeping. Pt declined bathing and dressing, however agreeable to transition to sitting EOB, completing with min A. Max anterior weight shift noted therefore engaged in functional reach tasks 2x 10 reps to promote upright posture and anterior weight shift for transfers. Pt completed sit<>stand 3x with min-mod assist and required mod A for static standing balance due to anterior weight shift. Pt reported max fatigue and slight light-headedness. Transitioned to supine with BP 127/77 and pulse 50. Pt reported feeling better in supine position. Completed bridging 5x for positioning in bed and increasing strength. Pt reported no longer feeling lightheaded and left with HOB elevated and all needs in reach.   Therapy Documentation Precautions:  Precautions Precautions: Fall Restrictions Weight Bearing Restrictions: No General:   Vital Signs: Therapy Vitals Pulse Rate: 50 BP: 127/77 Patient Position (if appropriate): Lying Pain: Pain Assessment Pain Score: 8  ADL: ADL Grooming: Moderate assistance Where Assessed-Grooming: Sitting at  sink Upper Body Bathing: Moderate assistance Where Assessed-Upper Body Bathing: Sitting at sink Lower Body Bathing: Maximal assistance Where Assessed-Lower Body Bathing: Sitting at sink Upper Body Dressing: Moderate assistance Where Assessed-Upper Body Dressing: Sitting at sink Lower Body Dressing: Dependent Where Assessed-Lower Body Dressing: Other (Comment) (stedy) Toilet Transfer: Dependent (stedy) Toilet Transfer Method: Other (comment) (stedy) Vision   Perception    Praxis   Exercises:   Other Treatments:     Therapy/Group: Individual Therapy  Daneil Dan 03/04/2020, 11:25 AM

## 2020-03-04 NOTE — Progress Notes (Signed)
Physical Therapy Session Note  Patient Details  Name: [name] MRN: [id number] Date of Birth: 30-Sep-1940  Today's Date: 03/04/2020 PT Individual Time: 1301-1359 PT Individual Time Calculation (min): 58 min   Short Term Goals: Week 2:  PT Short Term Goal 1 (Week 2): Pt will perform bed mobility with min A PT Short Term Goal 2 (Week 2): Pt will transfer bed<>chair with LRAD min A PT Short Term Goal 3 (Week 2): Pt will ambulate 18f with LRAD min A  Skilled Therapeutic Interventions/Progress Updates:   Pt received supine in bed and agreeable to PT. Supine>sit [facility] and cues for improved pelvic mobility to bring RLE to floor. Sit<>stand and then stand pivot transfer from Bed with min fading to mod assist due to strong posterior bias and R knee instability.  Pt reports feeling "VERY TIRED" today because she got no sleep "at all" over the past 2 days.   Pt transported to rehab gym. Sit>stand with min assist for safety and to facilitate anterior weight shift. Pt performed standing balance training to lift UE reciprocally above head x 4. Mild R lateral lean noticed, whichc pt unable to correct without cues or assist. Min-mod assist to prevent LOB to the R.   Gait training with RW x 530fwith mod fading to max assist due to RLE weakness and lateral lean.  PT provide pt with chair and pt sat back into WC with mod assist and poor control of speed.   Seated therex.  LAQ x 15 Ankle PF/DF x 25 Hip abduction x 15 Reciprocal march x 13.  Kinetron 30cm/sec, 2x 2 minutes with cues for improved ROM and symmetry intermittently.  Pt returned to room and performed stand pivot transfer to bed with min assist overall with RW and increased time; mild R knee instability present. Sit>supine completed with ** and left supine in bed with call bell in reach and all needs met.             Therapy Documentation Precautions:   Precautions Precautions: Fall Restrictions Weight Bearing Restrictions: No    Pain: Pain Assessment Pain Scale: Faces Faces Pain Scale: No hurt    Therapy/Group: Individual Therapy  AuLorie Phenix/24/2021, 2:00 PM

## 2020-03-04 NOTE — Progress Notes (Signed)
Speech Language Pathology Daily Session Note  Patient Details  Name: [name] MRN: [id number] Date of Birth: 04-Jun-1941  Today's Date: 03/04/2020 SLP Individual Time: 1100-1130 SLP Individual Time Calculation (min): 30 min  Short Term Goals: Week 2: SLP Short Term Goal 1 (Week 2): Pt will complete familar and functional problem solving tasks with supervision A verbal cues. SLP Short Term Goal 2 (Week 2): Pt will self-monitor and self-correct errors in functional tasks with supervision A verbal cues. SLP Short Term Goal 3 (Week 2): Pt will demonstrate sustained attention in 15 minute intervals with min A verbal cues for redirection during functional tasks. SLP Short Term Goal 4 (Week 2): Pt will utilizie external aids to recall novel and daily information with min A verbal cues. SLP Short Term Goal 5 (Week 2): Pt will consume dys 2 textures and thin liquid diet with supervision A verbal cues for use of swallow strategies. SLP Short Term Goal 6 (Week 2): Pt will utilizie speech intelligibility strategies at the phrase/sentence level to achieve 90% intelligibility with supervision A verbal cues.  Skilled Therapeutic Interventions: Pt was seen for skilled ST targeting cognitive goals. She required Min A question cues to recall earlier OT session and update her memory notebook. During a basic medication management task from the ALFA, she required Min A verbal and visual cues for functional problem solving and error awareness when arranging "pills" on a basic pill chart. Otherwise, she interpreted medication labels and verbally problem solved with Supervision A verbal cues. Memory notebook updated with ST activities, and pt left laying in bed with alarm set and needs within reach. Continue per current plan of care.        Pain Pain Assessment Pain Scale: Faces Faces Pain Scale: No hurt  Therapy/Group: Individual Therapy  Little Ishikawa 03/04/2020, 7:26 AM

## 2020-03-05 ENCOUNTER — Inpatient Hospital Stay (HOSPITAL_COMMUNITY): Payer: Medicare Other | Admitting: Occupational Therapy

## 2020-03-05 ENCOUNTER — Inpatient Hospital Stay (HOSPITAL_COMMUNITY): Payer: Medicare Other | Admitting: Speech Pathology

## 2020-03-05 DIAGNOSIS — I952 Hypotension due to drugs: Secondary | ICD-10-CM

## 2020-03-05 DIAGNOSIS — Z8679 Personal history of other diseases of the circulatory system: Secondary | ICD-10-CM

## 2020-03-05 NOTE — Progress Notes (Signed)
Occupational Therapy Session Note ° °Patient Details  °Name: [name] °MRN: [id number] °Date of Birth: 02/08/1941 ° °Today's Date: 03/05/2020 °OT Individual Time: 1203-1245 °OT Individual Time Calculation (min): 42 min  ° ° °Short Term Goals: °Week 2:  OT Short Term Goal 1 (Week 2): Pt will don pants with minA to demonstrsate increase dynamic standing balance. °OT Short Term Goal 2 (Week 2): Pt will attend to L visual field during functional mobility with min cues. °OT Short Term Goal 3 (Week 2): Pt will complete functional transfers with minA for 2 consecutive sessions. °OT Short Term Goal 4 (Week 2): Pt will don shirt with minA and moderate cognitive cues. ° °Skilled Therapeutic Interventions/Progress Updates:  °Patient met lying supine in be in agreement with OT treatment session with focus on postural alignment in sitting/standing, functional transfers, and functional mobility with attention to L visual field as detailed below. Patient completed lunch meal seated EOB with occasional verbal cues for posture with patient demonstrating rounded shoulders and posterior lean with fatigue. Sit to stand from elevated EOB and stand-pivot transfer to wc on L with Min A and use of RW. Patient declined standing for completion of oral hygiene at sink level requesting to complete task in sitting requiring set-up assist. Education on importance of OOB activity for progression toward goals with patient expressing verbal understanding. Patient in agreement with sitting in wc for 1 hour before asking nursing staff to assist her back to bed. Session concluded with patient seated in wc with call bell within reach, belt alarm activated, and all needs met.   ° °Therapy Documentation °Precautions:  °Precautions °Precautions: Fall °Restrictions °Weight Bearing Restrictions: No °General: °  ° °Therapy/Group: Individual Therapy ° ° R Howerton-Davis °03/05/2020, 7:17 AM °

## 2020-03-05 NOTE — Progress Notes (Signed)
Speech Language Pathology Daily Session Note  Patient Details  Name: [name] MRN: [id number] Date of Birth: 03-Feb-1941  Today's Date: 03/05/2020 SLP Individual Time: 4098-1191 SLP Individual Time Calculation (min): 40 min  Short Term Goals: Week 2: SLP Short Term Goal 1 (Week 2): Pt will complete familar and functional problem solving tasks with supervision A verbal cues. SLP Short Term Goal 2 (Week 2): Pt will self-monitor and self-correct errors in functional tasks with supervision A verbal cues. SLP Short Term Goal 3 (Week 2): Pt will demonstrate sustained attention in 15 minute intervals with min A verbal cues for redirection during functional tasks. SLP Short Term Goal 4 (Week 2): Pt will utilizie external aids to recall novel and daily information with min A verbal cues. SLP Short Term Goal 5 (Week 2): Pt will consume dys 2 textures and thin liquid diet with supervision A verbal cues for use of swallow strategies. SLP Short Term Goal 6 (Week 2): Pt will utilizie speech intelligibility strategies at the phrase/sentence level to achieve 90% intelligibility with supervision A verbal cues.  Skilled Therapeutic Interventions:  Pt was seen for skilled ST targeting goals for dysphagia and cognition.  SLP facilitated the session with a trial meal tray of dys 3 textures to determine readiness to advance diet.  Pt's intake was minimal but she was able to clear advanced solids from the oral cavity with increased time and without overt s/s of aspiration.  Pt reports that she usually doesn't eat breakfast until 11 so she's not typically hungry early in the morning. SLP discussed goals for safe diet progression and consistent use of swallowing precautions.  Pt reports she wants to continue with dys 3 diet for now but understands that diet can be downgraded if advanced solids are too difficult to tolerate.  Pt verbalized understanding.  Pt was able to brush her teeth with mod I for problem solving  following set up assist.  She continues to endorse memory challenges and needed min question cues to recall activities from therapy session in order to record details into her memory notebook.  Pt was left in bed with bed alarm set and call bell within reach.  Continue per current plan of care.    Pain Pain Assessment Pain Scale: 0-10 Pain Score: 0-No pain  Therapy/Group: Individual Therapy  Glendy Barsanti, Melanee Spry 03/05/2020, 12:27 PM

## 2020-03-05 NOTE — Progress Notes (Signed)
Occupational Therapy Session Note  Patient Details  Name: [name] MRN: [id number] Date of Birth: 08/19/1940  Today's Date: 03/05/2020 OT Individual Time: 2440-1027 OT Individual Time Calculation (min): 40 min   Skilled Therapeutic Interventions/Progress Updates:    Pt greeted in bed with no c/o pain, RN present to administer eye drops. Afterwards pt requested to use the bathroom. Supervision for supine<sit with HOB elevated. Min A for sit<stand and Min-Mod A for ambulation into bathroom using RW, vcs and manual cuing for improving upright alignment due to leaning forward and pushing device in front of her vs standing inside of it. Worked on neutral upright alignment in standing and sit<stands during toileting tasks. Pt having continent B+B void. Min A for sit<stands with min cuing, the same cuing provided for improving forward lean in standing as was provided during functional ambulation. Stand pivot<w/c completed with Min A using RW after. She opted to complete handwashing and several other grooming tasks sitting at sink vs standing due to fatigue. At end of session she required mod cuing to recall therapeutic events during session in her memory notebook, able to recall the year, month, and date on her own. Left her with all needs within reach and safety belt fastened.    Therapy Documentation Precautions:  Precautions Precautions: Fall Restrictions Weight Bearing Restrictions: No Vital Signs: Therapy Vitals Temp: 98.4 F (36.9 C) Temp Source: Oral Pulse Rate: 51 Resp: 17 BP: (!) 110/50 Patient Position (if appropriate): Sitting Oxygen Therapy SpO2: 96 % O2 Device: Room Air ADL: ADL Grooming: Moderate assistance Where Assessed-Grooming: Sitting at sink Upper Body Bathing: Moderate assistance Where Assessed-Upper Body Bathing: Sitting at sink Lower Body Bathing: Maximal assistance Where Assessed-Lower Body Bathing: Sitting at sink Upper Body Dressing: Moderate  assistance Where Assessed-Upper Body Dressing: Sitting at sink Lower Body Dressing: Dependent Where Assessed-Lower Body Dressing: Other (Comment) (stedy) Toilet Transfer: Dependent (stedy) Toilet Transfer Method: Other (comment) (stedy)      Therapy/Group: Individual Therapy  [name] A Ashe Gago 03/05/2020, 4:23 PM

## 2020-03-05 NOTE — Progress Notes (Signed)
[place] PHYSICAL MEDICINE & REHABILITATION PROGRESS NOTE  Subjective/Complaints: Patient seen laying in bed this morning.  She states she slept well overnight.  She denies complaints.  ROS: Denies CP, SOB, N/V/D  Objective: Vital Signs: Blood pressure (!) 110/50, pulse 51, temperature 98.6 F (37 C), resp. rate 17, height 5\' 2"  (1.575 m), weight 83.8 kg, SpO2 96 %. No results found. No results for input(s): WBC, HGB, HCT, PLT in the last 72 hours. No results for input(s): NA, K, CL, CO2, GLUCOSE, BUN, CREATININE, CALCIUM in the last 72 hours.  Physical Exam: BP (!) 110/50 (BP Location: Left Arm)   Pulse 51   Temp 98.6 F (37 C)   Resp 17   Ht 5\' 2"  (1.575 m)   Wt 83.8 kg   SpO2 96%   BMI 33.79 kg/m   Constitutional: No distress . Vital signs reviewed. HENT: Normocephalic.  Atraumatic. Eyes: Left eye patched. No discharge. Cardiovascular: No JVD.  Bradycardia. Respiratory: Normal effort.  No stridor.  Bilateral clear to auscultation. GI: Non-distended.  BS +. Skin: Warm and dry.  Intact. Psych: Slightly flat Musc: Bilateral lower remedies with edema, unchanged.  No tenderness. Neuro: Alert Left cranial nerve VII palsy, ? some improvement Dysarthria, stable Motor: Bilateral upper extremities: 4+/5 proximal distal Left lower extremity: 4/5 hip flexion, knee extension, 4/5 ankle dorsiflexion, unchanged Right lower extremity: Hip flexion, knee extension 4/5, ankle dorsiflexion 4/5, unchanged  Assessment/Plan: 1. 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  Physiatrist is providing close team supervision and 24 hour management of active medical problems listed below.  Physiatrist and rehab team continue to assess barriers to discharge/monitor patient progress toward functional and medical goals  Care Tool:  Bathing  Bathing activity  did not occur:  (N/A) Body parts bathed by patient: Right arm, Face   Body parts bathed by helper: Right arm, Left arm, Chest, Abdomen, Face     Bathing assist Assist Level: Supervision/Verbal cueing     Upper Body Dressing/Undressing Upper body dressing   What is the patient wearing?: Pull over shirt    Upper body assist Assist Level: Minimal Assistance - Patient > 75%    Lower Body Dressing/Undressing Lower body dressing      What is the patient wearing?: Incontinence brief, Pants     Lower body assist Assist for lower body dressing: Maximal Assistance - Patient 25 - 49%     Toileting Toileting Toileting Activity did not occur (Clothing management and hygiene only): N/A (no void or bm)  Toileting assist Assist for toileting: Maximal Assistance - Patient 25 - 49%     Transfers Chair/bed transfer  Transfers assist     Chair/bed transfer assist level: Minimal Assistance - Patient > 75%     Locomotion Ambulation   Ambulation assist   Ambulation activity did not occur: Safety/medical concerns (decreased strength/activity tolerance)  Assist level: Moderate Assistance - Patient 50 - 74% Assistive device: Walker-rolling Max distance: 67ft   Walk 10 feet activity   Assist  Walk 10 feet activity did not occur: Safety/medical concerns (decreased strength activity tolerance)  Assist level: Moderate Assistance - Patient - 50 - 74% Assistive device: Walker-rolling   Walk 50 feet activity   Assist Walk 50 feet with 2 turns activity did not occur: Safety/medical concerns (decreased strength activity tolerance)         Walk 150 feet activity   Assist  Walk 150 feet activity did not occur: Safety/medical concerns         Walk 10 feet on uneven surface  activity   Assist Walk 10 feet on uneven surfaces activity did not occur: Safety/medical concerns         Wheelchair     Assist Will patient use wheelchair at discharge?: Yes Type of  Wheelchair: Manual    Wheelchair assist level: Supervision/Verbal cueing Max wheelchair distance: 145ft    Wheelchair 50 feet with 2 turns activity    Assist        Assist Level: Supervision/Verbal cueing   Wheelchair 150 feet activity     Assist     Assist Level: Maximal Assistance - Patient 25 - 49%      Medical Problem List and Plan: 1.  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  Status post TPA.  Status post loop recorder placement  Continue CIR  Continue ophthalmic solution/ointments 2.  Antithrombotics: -DVT/anticoagulation: Lovenox.  Vascular study negative             -antiplatelet therapy: Aspirin 81 mg daily and Plavix 85 mg daily x3 weeks then Plavix alone 3. Pain Management: Oxycodone 15 mg every 4 hours as needed chronic pain  Controlled with meds on 7/25  Monitor with increased exertion 4. Mood: Melatonin 3 mg nightly, Remeron 15 mg nightly as needed,  BuSpar 30 mg twice daily             -antipsychotic agents: N/A 5. Neuropsych: This patient is ?fully capable of making decisions on her own behalf. 6. Skin/Wound Care: Routine skin checks 7. Fluids/Electrolytes/Nutrition: Routine in and outs  Reordered daily multivitamin she takes at home 8.  Hypertension.  Monitor with increased mobility  Lisinopril 10, amlodipine 10 PTA   Lisinopril 5 started on 7/12, decreased to 2.5 on 7/17  Controlled/soft on 7/25, will consider discontinuing lisinopril if persistent 9.  Hyperlipidemia.    Continue Lipitor 10.  Obesity.  BMI 35.56.  Follow-up dietary 11.  OSA.  Patient on CPAP at home. 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  Follow-up outpatient cardiology service 13. Dementia: Namenda 28 mg daily, Aricept 10 mg nightly. 14.  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  Mild hypoalbuminemia  Supplement initiated on  7/13  16.  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 Sleep disturbance  See #3  Improving overall 19. Bradycardia  Discussed with pharmacy, no offending agents  ECG reviewed, showing bradycardia with first-degree AV block with ?subacute infarct. No complaints of pain  Continue to await cards input  LOS: 13 days A FACE TO FACE EVALUATION WAS PERFORMED  Tyger Wichman [name] 03/05/2020, 1:31 PM

## 2020-03-05 NOTE — Plan of Care (Signed)
  Problem: RH BOWEL ELIMINATION Goal: RH STG MANAGE BOWEL WITH ASSISTANCE Description: STG Manage Bowel with Min assist Outcome: Progressing Goal: RH STG MANAGE BOWEL W/MEDICATION W/ASSISTANCE Description: STG Manage Bowel with Medication with Mod I Assistance. Outcome: Progressing   Problem: RH BLADDER ELIMINATION Goal: RH STG MANAGE BLADDER WITH ASSISTANCE Description: STG Manage Bladder With mod I assist Outcome: Progressing Goal: RH STG MANAGE BLADDER WITH MEDICATION WITH ASSISTANCE Description: STG Manage Bladder With Medication With Mod I Assistance. Outcome: Progressing Goal: RH STG MANAGE BLADDER WITH EQUIPMENT WITH ASSISTANCE Description: STG Manage Bladder With Equipment With Assistance Outcome: Progressing   Problem: RH SKIN INTEGRITY Goal: RH STG SKIN FREE OF INFECTION/BREAKDOWN Description: Min assist to maintain skin integrity Outcome: Progressing   Problem: RH SAFETY Goal: RH STG ADHERE TO SAFETY PRECAUTIONS W/ASSISTANCE/DEVICE Description: STG Adhere to Safety Precautions With supervision  Outcome: Progressing Goal: RH STG DECREASED RISK OF FALL WITH ASSISTANCE Description: STG Decreased Risk of Fall With mod I. Outcome: Progressing

## 2020-03-06 ENCOUNTER — Inpatient Hospital Stay (HOSPITAL_COMMUNITY): Payer: Medicare Other

## 2020-03-06 ENCOUNTER — Inpatient Hospital Stay (HOSPITAL_COMMUNITY): Payer: Medicare Other | Admitting: Occupational Therapy

## 2020-03-06 DIAGNOSIS — R0989 Other specified symptoms and signs involving the circulatory and respiratory systems: Secondary | ICD-10-CM

## 2020-03-06 LAB — BASIC METABOLIC PANEL
Anion gap: 7 (ref 5–15)
BUN: 15 mg/dL (ref 8–23)
CO2: 28 mmol/L (ref 22–32)
Calcium: 8.9 mg/dL (ref 8.9–10.3)
Chloride: 108 mmol/L (ref 98–111)
Creatinine, Ser: 1.02 mg/dL — ABNORMAL HIGH (ref 0.44–1.00)
GFR calc Af Amer: >60 mL/min (ref >60)
GFR calc non Af Amer: 52 mL/min — ABNORMAL LOW (ref >60)
Glucose, Bld: 88 mg/dL (ref 70–99)
Potassium: 3.4 mmol/L — ABNORMAL LOW (ref 3.5–5.1)
Sodium: 143 mmol/L (ref 135–145)

## 2020-03-06 MED ORDER — POTASSIUM CHLORIDE CRYS ER 20 MEQ PO TBCR
40.0000 meq | EXTENDED_RELEASE_TABLET | Freq: Once | ORAL | Status: AC
  Administered 2020-03-06: 40 meq via ORAL
  Filled 2020-03-06: qty 2

## 2020-03-06 NOTE — Progress Notes (Signed)
Speech Language Pathology Weekly Progress and Session Note  Patient Details  Name: [name] MRN: [id number] Date of Birth: 1941-03-23  Beginning of progress report period: February 29, 2020 End of progress report period: March 06, 2020  Today's Date: 03/06/2020 SLP Individual Time: 1447-1530 SLP Individual Time Calculation (min): 43 min  Short Term Goals: Week 2: SLP Short Term Goal 1 (Week 2): Pt will complete familar and functional problem solving tasks with supervision A verbal cues. SLP Short Term Goal 1 - Progress (Week 2): Met SLP Short Term Goal 2 (Week 2): Pt will self-monitor and self-correct errors in functional tasks with supervision A verbal cues. SLP Short Term Goal 2 - Progress (Week 2): Met SLP Short Term Goal 3 (Week 2): Pt will demonstrate sustained attention in 15 minute intervals with min A verbal cues for redirection during functional tasks. SLP Short Term Goal 3 - Progress (Week 2): Met SLP Short Term Goal 4 (Week 2): Pt will utilizie external aids to recall novel and daily information with min A verbal cues. SLP Short Term Goal 4 - Progress (Week 2): Met SLP Short Term Goal 5 (Week 2): Pt will consume dys 2 textures and thin liquid diet with supervision A verbal cues for use of swallow strategies. SLP Short Term Goal 5 - Progress (Week 2): Met SLP Short Term Goal 6 (Week 2): Pt will utilizie speech intelligibility strategies at the phrase/sentence level to achieve 90% intelligibility with supervision A verbal cues. SLP Short Term Goal 6 - Progress (Week 2): Met    New Short Term Goals: Week 3: SLP Short Term Goal 1 (Week 3): STG=LTG due to ELOS  Weekly Progress Updates: Pt made great progress meeting 6 out 6 goals this reporting period. Pt demonstrated swallow improvements with carryover of swallow strategies (oral clearance and lingual sweeps for buccal pocketing) and advancing textures to dys 3. Pt demonstrated improved sustained attention, basic problem  solving, error awareness and recall strategies. SLP utilized [employer] and functional tasks such as money, medication management. SLP added speech intelligibility goal. Pt demonstrated 90% intelligibility at the sentence level with supervision A verbal cues for over-articulation to compensate for left facial weakness. Pt would continue to benefit from skilled ST services to gain maximal independence and reduce burden of care.      Intensity: Minumum of 1-2 x/day, 30 to 90 minutes Frequency: 3 to 5 out of 7 days Duration/Length of Stay: 8/5 Treatment/Interventions: Cognitive remediation/compensation;Cueing hierarchy;Dysphagia/aspiration precaution training;Functional tasks;Internal/external aids;Patient/family education;Speech/Language facilitation   Daily Session  Skilled Therapeutic Interventions: Skilled ST services focused on cognitive skills. Pt demonstrated mod I use for memory notebook to recall today's events. SLP facilitated basic problem solving and recall of novel information in novel card task, pt demonstrated mod I for basic problem solving then min A verbal cues for mildly complex problem solving when played the more advance version. Pt required supervision A verbal cues for recall of three rules and use of external aid. Pt requested to get into bed. SLP assisted with transfer supervision A with walker. Pt required x1 verb cue for problem solving, when pt stated " now pull down my pants" after pivoting to the bed. Pt quickly realized her verbal error. Pt was left with nurse in room.     General    Pain Pain Assessment Pain Score: 3  Faces Pain Scale: No hurt PAINAD (Pain Assessment in Advanced Dementia) Breathing: normal Negative Vocalization: none Facial Expression: smiling or inexpressive Body Language: relaxed Consolability: no  need to console PAINAD Score: 0  Therapy/Group: Individual Therapy  Caralynn Gelber  [facility] 03/06/2020, 3:39 PM

## 2020-03-06 NOTE — Progress Notes (Signed)
[facility] PHYSICAL MEDICINE & REHABILITATION PROGRESS NOTE  Subjective/Complaints: Patient seen laying in bed this morning.  She states she slept well overnight.  ROS: Denies CP, SOB, N/V/D  Objective: Vital Signs: Blood pressure (!) 147/64, pulse (!) 42, temperature 98.1 F (36.7 C), temperature source Oral, resp. rate 20, height 5\' 2"  (1.575 m), weight 83.8 kg, SpO2 97 %. No results found. No results for input(s): WBC, HGB, HCT, PLT in the last 72 hours. Recent Labs    03/06/20 0640  NA 143  K 3.4*  CL 108  CO2 28  GLUCOSE 88  BUN 15  CREATININE 1.02*  CALCIUM 8.9    Physical Exam: BP (!) 147/64 (BP Location: Left Arm)   Pulse (!) 42   Temp 98.1 F (36.7 C) (Oral)   Resp 20   Ht 5\' 2"  (1.575 m)   Wt 83.8 kg   SpO2 97%   BMI 33.79 kg/m   Constitutional: No distress . Vital signs reviewed. HENT: Normocephalic.  Atraumatic. Eyes: Left eye patched.  No discharge. Cardiovascular: No JVD.  Bradycardia. Respiratory: Normal effort.  No stridor.  Bilateral clear to auscultation. GI: Non-distended.  BS +. Skin: Warm and dry.  Intact. Psych: Somewhat flat. Musc: No edema in extremities.  No tenderness in extremities. Neuro: Alert Left cranial nerve VII palsy, ? some improvement Dysarthria, stable Motor: Bilateral upper extremities: 4+/5 proximal distal Left lower extremity: 4/5 hip flexion, knee extension, 4/5 ankle dorsiflexion, stable Right lower extremity: Hip flexion, knee extension 4/5, ankle dorsiflexion 4/5, stable  Assessment/Plan: 1. 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  Physiatrist is providing close team supervision and 24 hour management of active medical problems listed below.  Physiatrist and rehab team continue to assess barriers to discharge/monitor patient progress toward functional and medical goals  Care  Tool:  Bathing  Bathing activity did not occur:  (N/A) Body parts bathed by patient: Right arm, Face, Left arm, Chest, Abdomen   Body parts bathed by helper: Right arm, Left arm, Chest, Abdomen, Face     Bathing assist Assist Level: Supervision/Verbal cueing     Upper Body Dressing/Undressing Upper body dressing   What is the patient wearing?: Pull over shirt    Upper body assist Assist Level: Supervision/Verbal cueing    Lower Body Dressing/Undressing Lower body dressing      What is the patient wearing?: Incontinence brief, Pants     Lower body assist Assist for lower body dressing: Moderate Assistance - Patient 50 - 74%     Toileting Toileting Toileting Activity did not occur 03/08/20 and hygiene only): N/A (no void or bm)  Toileting assist Assist for toileting: Maximal Assistance - Patient 25 - 49%     Transfers Chair/bed transfer  Transfers assist     Chair/bed transfer assist level: Minimal Assistance - Patient > 75%     Locomotion Ambulation   Ambulation assist   Ambulation activity did not occur: Safety/medical concerns (decreased strength/activity tolerance)  Assist level: Moderate Assistance - Patient 50 - 74% Assistive device: Walker-rolling Max distance: 72ft   Walk 10 feet activity   Assist  Walk 10 feet activity did not occur: Safety/medical concerns (decreased strength activity tolerance)  Assist level: Moderate Assistance - Patient - 50 - 74% Assistive device: Walker-rolling   Walk 50 feet activity   Assist Walk 50 feet with 2 turns activity did not occur: Safety/medical concerns (  decreased strength activity tolerance)         Walk 150 feet activity   Assist Walk 150 feet activity did not occur: Safety/medical concerns         Walk 10 feet on uneven surface  activity   Assist Walk 10 feet on uneven surfaces activity did not occur: Safety/medical concerns         Wheelchair     Assist Will  patient use wheelchair at discharge?: Yes Type of Wheelchair: Manual    Wheelchair assist level: Supervision/Verbal cueing Max wheelchair distance: 114ft    Wheelchair 50 feet with 2 turns activity    Assist        Assist Level: Supervision/Verbal cueing   Wheelchair 150 feet activity     Assist     Assist Level: Maximal Assistance - Patient 25 - 49%      Medical Problem List and Plan: 1.  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  Status post TPA.  Status post loop recorder placement  Continue CIR  Continue ophthalmic solution/ointments 2.  Antithrombotics: -DVT/anticoagulation: Lovenox.  Vascular study negative             -antiplatelet therapy: Aspirin 81 mg daily and Plavix 85 mg daily x3 weeks then Plavix alone 3. Pain Management: Oxycodone 15 mg every 4 hours as needed chronic pain  Controlled with meds on 7/26  Monitor with increased exertion 4. Mood: Melatonin 3 mg nightly, Remeron 15 mg nightly as needed,  BuSpar 30 mg twice daily             -antipsychotic agents: N/A 5. Neuropsych: This patient is ?fully capable of making decisions on her own behalf. 6. Skin/Wound Care: Routine skin checks 7. Fluids/Electrolytes/Nutrition: Routine in and outs  Reordered daily multivitamin she takes at home 8.  Hypertension.  Monitor with increased mobility  Lisinopril 10, amlodipine 10 PTA   Lisinopril 5 started on 7/12, decreased to 2.5 on 7/17  Slightly labile on 7/26, monitor for trend 9.  Hyperlipidemia.    Continue Lipitor 10.  Obesity.  BMI 35.56.  Follow-up dietary 11.  OSA.  Patient on CPAP at home. 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  Follow-up outpatient cardiology service 13. Dementia: Namenda 28 mg daily, Aricept 10 mg nightly. 14.  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  Mild hypoalbuminemia  Supplement initiated on 7/13  16.   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 Sleep disturbance  See #3  Improving overall 19. Bradycardia  Discussed with pharmacy, no offending agents  ECG reviewed, showing bradycardia with first-degree AV block with ?subacute infarct. No complaints of pain  Continue to await cards input, will recheck again 20.  Hypokalemia  Potassium 3.4 on 7/26  Supplemented x1 day on 7/26  LOS: 14 days A FACE TO FACE EVALUATION WAS PERFORMED  Quintell Bonnin [name] 03/06/2020, 12:42 PM

## 2020-03-06 NOTE — Progress Notes (Signed)
Physical Therapy Session Note  Patient Details  Name: [name] MRN: [id number] Date of Birth: 25-Jun-1941  Today's Date: 03/06/2020 PT Individual Time: 1135-1200 and 1300-1340  PT Individual Time Calculation (min): 25 min and 40 min  Short Term Goals: Week 1:  PT Short Term Goal 1 (Week 1): pt to perform bed mobility at min A x1 PT Short Term Goal 1 - Progress (Week 1): Progressing toward goal PT Short Term Goal 2 (Week 1): pt to perform STS transfers and stand pivot transfers at mod A x2 PT Short Term Goal 2 - Progress (Week 1): Met PT Short Term Goal 3 (Week 1): pt to initiate gait training PT Short Term Goal 3 - Progress (Week 1): Met PT Short Term Goal 4 (Week 1): pt to perform manual WC propulsion at mod A x1 for 150' PT Short Term Goal 4 - Progress (Week 1): Progressing toward goal Week 2:  PT Short Term Goal 1 (Week 2): Pt will perform bed mobility with min A PT Short Term Goal 2 (Week 2): Pt will transfer bed<>chair with LRAD min A PT Short Term Goal 3 (Week 2): Pt will ambulate 55f with LRAD min A  Skilled Therapeutic Interventions/Progress Updates:   Treatment Session 1: 1135-1200 25 min Received pt supine in bed, pt agreeable to therapy, and denied any pain during session but reported fatigue stating that she did not sleep well last night and frequently stating "I don't know why i'm so tired" throughout session. Pt agreeable to bed level exercises but kept eyes closed 90% of session. Pt performed the following exercises supine in bed with supervision and verbal cues for technique: -heel slides x20 bilaterally -hip abduction x20 bilaterally  -ankle circles x10 on L LE and x20 on R LE clockwise/counterclockwise  -plantarflexion 2x10 bilaterally with therapist applying resistance with orange TB -dorsiflexion 2x10 on L LE and 3x10 on R LE with therapist applying resistance with orange TB -SLR 2x10 bilaterally with cues for eccentric control Pt required multiple rest  breaks throughout session due to increased fatigue. Concluded session with pt supine in bed, needs within reach, and bed alarm on.  Treatment Session 2: 11856-314940 min Received pt supine in bed, pt agreeable to therapy, and denied any pain during session. Session with emphasis on functional mobility/transfers, generalized strengthening, dynamic standing balance/coordination, ambulation, and improved activity tolerance. Pt transferred supine<>sitting EOB with HOB elevated and supervision and donned shoes sitting EOB with max A for time management purposes. Pt transferred bed<>WC via stand<>pivot with RW and mod A with cues for turning technique. No buckling in R LE or LOB noted. Donned gloves with supervision and sweater around pt's shoulder for warmth. [facility] for [facility] purposes. Pt ambulated 278fx 2 trials with RW min/mod A. Pt demonstrated flexed trunk and decreased stride length and required cues for upright posture. Pt transferred stand<>pivot WC<>Nustep with RW and min A and performed bilateral UE/LE strengthening on Nustep at workload 3 for 6 minutes with 1 rest break in between for a total of 138 steps for improved cardiovascular endurance. Nustep<>WC stand<>pivot min A with RW. Pt required multiple rest breaks throughout session due to increased fatigue. [facility] Concluded session with pt sitting in WC, needs within reach, and chair pad alarm on. Pt reported new onset of headache; RN made aware.   Therapy Documentation Precautions:  Precautions Precautions: Fall Restrictions Weight Bearing Restrictions: No  Therapy/Group: Individual Therapy Alfonse Alpers PT, DPT   03/06/2020, 7:31 AM

## 2020-03-06 NOTE — Progress Notes (Signed)
Occupational Therapy Session Note  Patient Details  Name: [name] MRN: [id number] Date of Birth: 06-08-1941  Today's Date: 03/06/2020 OT Individual Time: 0947-0962 OT Individual Time Calculation (min): 43 min    Short Term Goals: Week 2:  OT Short Term Goal 1 (Week 2): Pt will don pants with minA to demonstrsate increase dynamic standing balance. OT Short Term Goal 2 (Week 2): Pt will attend to L visual field during functional mobility with min cues. OT Short Term Goal 3 (Week 2): Pt will complete functional transfers with minA for 2 consecutive sessions. OT Short Term Goal 4 (Week 2): Pt will don shirt with minA and moderate cognitive cues.  Skilled Therapeutic Interventions/Progress Updates:    Treatment session with focus on ADL retraining, functional transfers, and activity endurance. Pt received semi-reclined in bed reporting fatigue despite sleeping well and having no pain. Pt agreeable to therapy. Sat at EOB with CGA, completing stand pivot transfer with minA with RW bed > w/c. Completed oral care with set up and bathed and dressed with ADL items set up to Lt to increase Lt attention, providing indirect cues for visual scanning. Bathed with supervision seated at w/c excluding perineal care, with pt participating in set up with cues to increase visual scanning and cognitive challenge. Pt would benefit from continued visual scanning interventions. Donned shirt with supervision to cue pt to pull shirt over head. Pt with mild incontinence, donned fresh brief and pants with modA to assist with threading LLE through pant leg and to assist with balance. Ended session with pt seated in w/c with all needs within reach and seat alarm on.  Therapy Documentation Precautions:  Precautions Precautions: Fall Restrictions Weight Bearing Restrictions: No General:   Vital Signs:  Pain:  No pain reported.  Therapy/Group: Individual Therapy  Blair Heys 03/06/2020, 12:26 PM

## 2020-03-06 NOTE — Progress Notes (Addendum)
Patient ID: [name], female   DOB: 13-Dec-1940, 79 y.o.   MRN: [id number]  This SW covering for assigned SW [employer].   SW returned phone call to pt son [name] [phone]) to discuss [facility] preference: 1) [facility], 2) [facility], and [place]. SW discussed family education. Family education scheduled for Friday 7/30 and Monday 8/2 10am-12pm with pt son. He asked questions if hospital bed is needed. SW informed not sure, however will discuss further with the team.   SW sent referral to [facility] who will follow-up about referral.   Cecile Sheerer, MSW, LCSWA Office: [phone] Cell: [phone] Fax: [phone]

## 2020-03-07 ENCOUNTER — Ambulatory Visit: Payer: Medicare Other

## 2020-03-07 ENCOUNTER — Inpatient Hospital Stay (HOSPITAL_COMMUNITY): Payer: Medicare Other | Admitting: Occupational Therapy

## 2020-03-07 ENCOUNTER — Inpatient Hospital Stay (HOSPITAL_COMMUNITY): Payer: Medicare Other

## 2020-03-07 DIAGNOSIS — E876 Hypokalemia: Secondary | ICD-10-CM

## 2020-03-07 NOTE — Progress Notes (Signed)
[place] PHYSICAL MEDICINE & REHABILITATION PROGRESS NOTE  Subjective/Complaints: Patient seen sitting up in bed this morning.  She states she slept well overnight.  She states she feels a little tired this morning.  ROS: Denies CP, SOB, N/V/D  Objective: Vital Signs: Blood pressure (!) 153/63, pulse (!) 41, temperature 98.2 F (36.8 C), temperature source Oral, resp. rate 15, height 5\' 2"  (1.575 m), weight 83.8 kg, SpO2 96 %. No results found. No results for input(s): WBC, HGB, HCT, PLT in the last 72 hours. Recent Labs    03/06/20 0640  NA 143  K 3.4*  CL 108  CO2 28  GLUCOSE 88  BUN 15  CREATININE 1.02*  CALCIUM 8.9    Physical Exam: BP (!) 153/63    Pulse (!) 41    Temp 98.2 F (36.8 C) (Oral)    Resp 15    Ht 5\' 2"  (1.575 m)    Wt 83.8 kg    SpO2 96%    BMI 33.79 kg/m   Constitutional: No distress . Vital signs reviewed. HENT: Normocephalic.  Atraumatic. Eyes: Left eye patched.  No discharge. Cardiovascular: No JVD.  Bradycardia. Respiratory: Normal effort.  No stridor.  Bilateral clear to auscultation. GI: Non-distended.  BS +. Skin: Warm and dry.  Intact. Psych: Somewhat flat. Musc: No edema in extremities.  No tenderness in extremities. Neuro: Alert Left cranial nerve VII palsy, ? some improvement Dysarthria, unchanged Motor: Bilateral upper extremities: 4+/5 proximal distal Left lower extremity: 4-/5 hip flexion, knee extension, 4/5 ankle dorsiflexion Right lower extremity: Hip flexion, knee extension 4-/5, ankle dorsiflexion 4/5  Assessment/Plan: 1. 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  Physiatrist is providing close team supervision and 24 hour management of active medical problems listed below.  Physiatrist and rehab team continue to assess barriers to discharge/monitor patient progress toward functional and  medical goals  Care Tool:  Bathing  Bathing activity did not occur:  (N/A) Body parts bathed by patient: Right arm, Face, Left arm, Chest, Abdomen   Body parts bathed by helper: Right arm, Left arm, Chest, Abdomen, Face     Bathing assist Assist Level: Supervision/Verbal cueing     Upper Body Dressing/Undressing Upper body dressing   What is the patient wearing?: Pull over shirt    Upper body assist Assist Level: Supervision/Verbal cueing    Lower Body Dressing/Undressing Lower body dressing      What is the patient wearing?: Incontinence brief, Pants     Lower body assist Assist for lower body dressing: Moderate Assistance - Patient 50 - 74%     Toileting Toileting Toileting Activity did not occur (Clothing management and hygiene only): N/A (no void or bm)  Toileting assist Assist for toileting: Maximal Assistance - Patient 25 - 49%     Transfers Chair/bed transfer  Transfers assist     Chair/bed transfer assist level: Moderate Assistance - Patient 50 - 74%     Locomotion Ambulation   Ambulation assist   Ambulation activity did not occur: Safety/medical concerns (decreased strength/activity tolerance)  Assist level: Moderate Assistance - Patient 50 - 74% Assistive device: Walker-rolling Max distance: 52ft   Walk 10 feet activity   Assist  Walk 10 feet activity did not occur: Safety/medical concerns (decreased strength activity tolerance)  Assist level: Moderate Assistance - Patient - 50 - 74% Assistive device: Walker-rolling   Walk 50 feet activity  Assist Walk 50 feet with 2 turns activity did not occur: Safety/medical concerns (decreased strength activity tolerance)         Walk 150 feet activity   Assist Walk 150 feet activity did not occur: Safety/medical concerns         Walk 10 feet on uneven surface  activity   Assist Walk 10 feet on uneven surfaces activity did not occur: Safety/medical concerns          Wheelchair     Assist Will patient use wheelchair at discharge?: Yes Type of Wheelchair: Manual    Wheelchair assist level: Supervision/Verbal cueing Max wheelchair distance: 141ft    Wheelchair 50 feet with 2 turns activity    Assist        Assist Level: Supervision/Verbal cueing   Wheelchair 150 feet activity     Assist     Assist Level: Maximal Assistance - Patient 25 - 49%      Medical Problem List and Plan: 1.  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  Status post TPA.  Status post loop recorder placement  Continue CIR  Continue ophthalmic solution/ointments 2.  Antithrombotics: -DVT/anticoagulation: Lovenox.  Vascular study negative             -antiplatelet therapy: Aspirin 81 mg daily and Plavix 85 mg daily x3 weeks then Plavix alone 3. Pain Management: Oxycodone 15 mg every 4 hours as needed chronic pain  Controlled with meds on 7/27  Monitor with increased exertion 4. Mood: Melatonin 3 mg nightly, Remeron 15 mg nightly as needed,  BuSpar 30 mg twice daily             -antipsychotic agents: N/A 5. Neuropsych: This patient is ?fully capable of making decisions on her own behalf. 6. Skin/Wound Care: Routine skin checks 7. Fluids/Electrolytes/Nutrition: Routine in and outs  Reordered daily multivitamin she takes at home 8.  Hypertension.  Monitor with increased mobility  Lisinopril 10, amlodipine 10 PTA   Lisinopril 5 started on 7/12, decreased to 2.5 on 7/17  Slightly elevated on 7/27, monitor for trend 9.  Hyperlipidemia.    Continue Lipitor 10.  Obesity.  BMI 35.56.  Follow-up dietary 11.  OSA.  Patient on CPAP at home. 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  Follow-up outpatient cardiology service 13. Dementia: Namenda 28 mg daily, Aricept 10 mg nightly. 14.  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  Mild  hypoalbuminemia  Supplement initiated on 7/13  16.  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 Sleep disturbance  See #3  Improving overall 19. Bradycardia  Discussed with pharmacy, no offending agents  ECG reviewed, showing bradycardia with first-degree AV block with ?subacute infarct. No complaints of pain  Per discussion with cardiology, no changes at present per cardiology, follow-up as outpatient. 20.  Hypokalemia  Potassium 3.4 on 7/26  Supplemented x1 day on 7/26  LOS: 15 days A FACE TO FACE EVALUATION WAS PERFORMED  Shakim Faith [name] 03/07/2020, 8:47 AM

## 2020-03-07 NOTE — Progress Notes (Signed)
Patient ID: [name], female   DOB: Sep 10, 1940, 79 y.o.   MRN: [id number]  This SW covering for assigned SW, Lavera Guise.  HHPT/OT/SLP/Aide referral accepted by [facility].   Cecile Sheerer, MSW, LCSWA Office: [phone] Cell: [phone] Fax: [phone]

## 2020-03-07 NOTE — Progress Notes (Signed)
Physical Therapy Session Note  Patient Details  Name: [name] MRN: [id number] Date of Birth: 03-27-1941  Today's Date: 03/07/2020 PT Individual Time: 1314-3888 PT Individual Time Calculation (min): 58 min   Short Term Goals: Week 1:  PT Short Term Goal 1 (Week 1): pt to perform bed mobility at min A x1 PT Short Term Goal 1 - Progress (Week 1): Progressing toward goal PT Short Term Goal 2 (Week 1): pt to perform STS transfers and stand pivot transfers at mod A x2 PT Short Term Goal 2 - Progress (Week 1): Met PT Short Term Goal 3 (Week 1): pt to initiate gait training PT Short Term Goal 3 - Progress (Week 1): Met PT Short Term Goal 4 (Week 1): pt to perform manual WC propulsion at mod A x1 for 150' PT Short Term Goal 4 - Progress (Week 1): Progressing toward goal Week 2:  PT Short Term Goal 1 (Week 2): Pt will perform bed mobility with min A PT Short Term Goal 2 (Week 2): Pt will transfer bed<>chair with LRAD min A PT Short Term Goal 3 (Week 2): Pt will ambulate 51f with LRAD min A  Skilled Therapeutic Interventions/Progress Updates:   Received pt supine in bed, pt agreeable to therapy, and denied any pain during session. Session with emphasis on functional mobility/transfers, generalized strengthening, dynamic standing balance/coordination, ambulation, and improved activity tolerance. Pt transferred supine<>sitting EOB with supervision with HOB elevated and donned shoes max A for time management purposes. Pt transferred bed<>WC stand<>pivot with RW min A with cues for turning technique. Pt performed WC mobility >1532fusing bilateral UEs and supervision with 1 rest break. Pt ambulated 6124fith RW and min A +2 for WC follow. Pt demonstrated flexed trunk and decreased stride length and required cues for upright posture. Pt [facility] and performed simulated car transfer with RW and min A with cues for turning technique and to reach back prior to sitting. Pt  transported to therapy gym but reported immediate urge to have BM and [facility] and transferred WC<>toilet with bedside commode over top with min A using grab bars and required max A to doff pants/brief. Pt with loose BM and able to void. Sit<>stand min A, total A for peri-care, and max A for clothing management. Stand<>pivot toilet<>WC min A. Concluded session with pt sitting in WC, needs within reach, and seatbelt alarm on.  Therapy Documentation Precautions:  Precautions Precautions: Fall Restrictions Weight Bearing Restrictions: No  Therapy/Group: Individual Therapy AnnAlfonse Alpers, DPT   03/07/2020, 7:36 AM

## 2020-03-07 NOTE — Progress Notes (Signed)
Occupational Therapy Session Note  Patient Details  Name: [name] MRN: [id number] Date of Birth: 09-06-1940  Today's Date: 03/07/2020 OT Individual Time: 1884-1660 OT Individual Time Calculation (min): 73 min    Short Term Goals: Week 2:  OT Short Term Goal 1 (Week 2): Pt will don pants with minA to demonstrsate increase dynamic standing balance. OT Short Term Goal 2 (Week 2): Pt will attend to L visual field during functional mobility with min cues. OT Short Term Goal 3 (Week 2): Pt will complete functional transfers with minA for 2 consecutive sessions. OT Short Term Goal 4 (Week 2): Pt will don shirt with minA and moderate cognitive cues.  Skilled Therapeutic Interventions/Progress Updates:    Treatment session with ADL retraining and functional transfers. Pt received semi-reclined in bed with NT present changing brief. Donned brief via bed rolls with total assistance. Sat at EOB with minA. Completed stand pivot transfer with RW with minA bed > w/c. Pt agreeable to shower. Completed stand pivot transfer with use of grab bar and minA to TTB. Showered with minA for perineal care sit <> stands and lat leans. Completed oral care and face washing seated at sink with set up, with pt donning bonnet with supervision. Provided cues throughout dressing to visually scan for ADL items to improve visual scanning for increased safety awareness during functional mobility. Dressed UB seated at w/c with set up. Donned pants and brief with minA to assist with threading LLE through pants and providing CGA for standing balance. Doffed and donned socks with supervision and extended time for rest breaks for energy conservation. Pt reported urgent need to toilet, completed stand pivot transfers with grab bar with minA. Pt completed clothing management with min-modA and completed perineal care with minA. Ended session with pt seated in w/c with all needs within reach and seat alarm on.  Therapy  Documentation Precautions:  Precautions Precautions: Fall Restrictions Weight Bearing Restrictions: No General:   Vital Signs:  Pain: Pain Assessment Pain Scale: 0-10 Pain Score: 0-No pain Faces Pain Scale: No hurt  Therapy/Group: Individual Therapy  Blair Heys 03/07/2020, 12:25 PM

## 2020-03-07 NOTE — Plan of Care (Signed)
  Problem: RH Wheelchair Mobility Goal: LTG Patient will propel w/c in community environment (PT) Description: LTG: Patient will propel wheelchair in community environment, # of feet with assist (PT) Outcome: Not Applicable Flowsheets (Taken 03/07/2020 0752) LTG: Pt will propel w/c in community environ  assist needed:: (D/C) -- Note: D/C   Problem: RH Balance Goal: LTG Patient will maintain dynamic standing balance (PT) Description: LTG:  Patient will maintain dynamic standing balance with assistance during mobility activities (PT) Flowsheets (Taken 03/07/2020 0752) LTG: Pt will maintain dynamic standing balance during mobility activities with:: (upgraded due to imporved strength, balance, and endurance) Supervision/Verbal cueing Note: upgraded due to imporved strength, balance, and endurance   Problem: Sit to Stand Goal: LTG:  Patient will perform sit to stand with assistance level (PT) Description: LTG:  Patient will perform sit to stand with assistance level (PT) Flowsheets (Taken 03/07/2020 0752) LTG: PT will perform sit to stand in preparation for functional mobility with assistance level: (upgraded due to imporved strength, balance, and endurance) Supervision/Verbal cueing Note: upgraded due to imporved strength, balance, and endurance   Problem: RH Bed Mobility Goal: LTG Patient will perform bed mobility with assist (PT) Description: LTG: Patient will perform bed mobility with assistance, with/without cues (PT). Flowsheets (Taken 03/07/2020 0752) LTG: Pt will perform bed mobility with assistance level of: (upgraded due to imporved strength, trunk control, and endurance) Independent with assistive device  Note: upgraded due to imporved strength, trunk control, and endurance   Problem: RH Bed to Chair Transfers Goal: LTG Patient will perform bed/chair transfers w/assist (PT) Description: LTG: Patient will perform bed to chair transfers with assistance (PT). Flowsheets (Taken  03/07/2020 0752) LTG: Pt will perform Bed to Chair Transfers with assistance level: (upgraded due to imporved strength, balance, and endurance) Contact Guard/Touching assist Note: upgraded due to imporved strength, balance, and endurance

## 2020-03-08 ENCOUNTER — Inpatient Hospital Stay (HOSPITAL_COMMUNITY): Payer: Medicare Other

## 2020-03-08 ENCOUNTER — Inpatient Hospital Stay (HOSPITAL_COMMUNITY): Payer: Medicare Other | Admitting: Occupational Therapy

## 2020-03-08 DIAGNOSIS — I6300 Cerebral infarction due to thrombosis of unspecified precerebral artery: Secondary | ICD-10-CM

## 2020-03-08 MED ORDER — LISINOPRIL 2.5 MG PO TABS
2.5000 mg | ORAL_TABLET | Freq: Once | ORAL | Status: AC
  Administered 2020-03-08: 2.5 mg via ORAL
  Filled 2020-03-08: qty 1

## 2020-03-08 MED ORDER — LISINOPRIL 5 MG PO TABS
5.0000 mg | ORAL_TABLET | Freq: Every day | ORAL | Status: DC
  Administered 2020-03-09 – 2020-03-12 (×4): 5 mg via ORAL
  Filled 2020-03-08 (×4): qty 1

## 2020-03-08 NOTE — Plan of Care (Signed)
  Problem: RH Balance Goal: LTG Patient will maintain dynamic standing balance (PT) Description: LTG:  Patient will maintain dynamic standing balance with assistance during mobility activities (PT) Flowsheets (Taken 03/08/2020 1230) LTG: Pt will maintain dynamic standing balance during mobility activities with:: (downgraded due to fatigue, LE weakness, decreased balance) Contact Guard/Touching assist Note: downgraded due to fatigue, LE weakness, decreased balance

## 2020-03-08 NOTE — Progress Notes (Addendum)
[place] PHYSICAL MEDICINE & REHABILITATION PROGRESS NOTE  Subjective/Complaints: Patient seen laying in bed this morning.  She states he slept well overnight.  She states she would like to go home earlier if possible.  ROS: Denies CP, SOB, N/V/D  Objective: Vital Signs: Blood pressure (!) 154/67, pulse (!) 43, temperature (!) 97.5 F (36.4 C), temperature source Oral, resp. rate 15, height 5\' 2"  (1.575 m), weight 83.8 kg, SpO2 95 %. No results found. No results for input(s): WBC, HGB, HCT, PLT in the last 72 hours. Recent Labs    03/06/20 0640  NA 143  K 3.4*  CL 108  CO2 28  GLUCOSE 88  BUN 15  CREATININE 1.02*  CALCIUM 8.9    Physical Exam: BP (!) 154/67 (BP Location: Left Arm)   Pulse (!) 43   Temp (!) 97.5 F (36.4 C) (Oral)   Resp 15   Ht 5\' 2"  (1.575 m)   Wt 83.8 kg   SpO2 95%   BMI 33.79 kg/m   Constitutional: No distress . Vital signs reviewed. HENT: Normocephalic.  Atraumatic. Poor dentition.  Eyes: Limited movement in left eye. No discharge. Cardiovascular: No JVD. Bradycardia.  Respiratory: Normal effort.  No stridor. Bilaterally clear to auscultation.  GI: Non-distended. BS+. Skin: Warm and dry.  Intact. Psych: Somewhat flat.  Musc: No edema in extremities.  No tenderness in extremities. Neuro: Alert. Left cranial nerve VII palsy, ?some improvement Dysarthria, unchanged Motor: Bilateral upper extremities: 4+/5 proximal distal Left lower extremity: 4-/5 hip flexion, knee extension, 4/5 ankle dorsiflexion, stable Right lower extremity: Hip flexion, knee extension 4-/5, ankle dorsiflexion 4/5, stable  Assessment/Plan: 1. 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  Physiatrist is providing close team supervision and 24 hour management of active medical problems listed below.  Physiatrist and rehab team continue  to assess barriers to discharge/monitor patient progress toward functional and medical goals  Care Tool:  Bathing  Bathing activity did not occur:  (N/A) Body parts bathed by patient: Right arm, Face, Left arm, Chest, Abdomen, Front perineal area, Buttocks, Right upper leg, Left upper leg, Right lower leg, Left lower leg   Body parts bathed by helper: Right arm, Left arm, Chest, Abdomen, Face     Bathing assist Assist Level: Minimal Assistance - Patient > 75%     Upper Body Dressing/Undressing Upper body dressing   What is the patient wearing?: Pull over shirt    Upper body assist Assist Level: Supervision/Verbal cueing    Lower Body Dressing/Undressing Lower body dressing      What is the patient wearing?: Incontinence brief, Pants     Lower body assist Assist for lower body dressing: Minimal Assistance - Patient > 75%     Toileting Toileting Toileting Activity did not occur (Clothing management and hygiene only): N/A (no void or bm)  Toileting assist Assist for toileting: Moderate Assistance - Patient 50 - 74%     Transfers Chair/bed transfer  Transfers assist     Chair/bed transfer assist level: Minimal Assistance - Patient > 75%     Locomotion Ambulation   Ambulation assist   Ambulation activity did not occur: Safety/medical concerns (decreased strength/activity tolerance)  Assist level: Minimal Assistance - Patient > 75% Assistive device: Walker-rolling Max distance: 56ft   Walk 10 feet activity   Assist  Walk 10 feet activity did not occur: Safety/medical concerns (decreased strength activity tolerance)  Assist level: Minimal Assistance - Patient > 75% Assistive device: Walker-rolling   Walk 50 feet activity   Assist Walk 50 feet with 2 turns activity did not occur: Safety/medical concerns (decreased strength activity tolerance)  Assist level: Minimal Assistance - Patient > 75% Assistive device: Walker-rolling    Walk 150 feet  activity   Assist Walk 150 feet activity did not occur: Safety/medical concerns         Walk 10 feet on uneven surface  activity   Assist Walk 10 feet on uneven surfaces activity did not occur: Safety/medical concerns         Wheelchair     Assist Will patient use wheelchair at discharge?: Yes Type of Wheelchair: Manual    Wheelchair assist level: Supervision/Verbal cueing Max wheelchair distance: >156ft    Wheelchair 50 feet with 2 turns activity    Assist        Assist Level: Supervision/Verbal cueing   Wheelchair 150 feet activity     Assist     Assist Level: Supervision/Verbal cueing      Medical Problem List and Plan: 1.  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  Status post TPA.  Status post loop recorder placement  Continue CIR  Continue ophthalmic solution/ointments, continue patch.  Team conference today to discuss current and goals and coordination of care, home and environmental barriers, and discharge planning with nursing, case manager, and therapies. Please see conference note from today as well.  2.  Antithrombotics: -DVT/anticoagulation: Lovenox.  Vascular study negative  CBC ordered for tomorrow             -antiplatelet therapy: Aspirin 81 mg daily and Plavix 85 mg daily x3 weeks then Plavix alone 3. Pain Management: Oxycodone 15 mg every 4 hours as needed chronic pain  Controlled with meds on 7/28  Monitor with increased exertion 4. Mood: Melatonin 3 mg nightly, Remeron 15 mg nightly as needed,  BuSpar 30 mg twice daily             -antipsychotic agents: N/A 5. Neuropsych: This patient is ?fully capable of making decisions on her own behalf. 6. Skin/Wound Care: Routine skin checks 7. Fluids/Electrolytes/Nutrition: Routine in and outs  Reordered daily multivitamin she takes at home 8.  Hypertension.  Monitor with increased  mobility  Lisinopril 10, amlodipine 10 PTA   Lisinopril 5 started on 7/12, decreased to 2.5 on 7/17, increased to 5 again on 7/28  Remains elevated on 7/28 9.  Hyperlipidemia.    Continue Lipitor 10.  Obesity.  BMI 35.56.  Follow-up dietary 11.  OSA.  Patient on CPAP at home. 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  Follow-up outpatient cardiology service 13. Dementia: Namenda 28 mg daily, Aricept 10 mg nightly. 14.  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  Mild hypoalbuminemia  Supplement initiated on 7/13  16.  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 Sleep disturbance  See #3  Improving overall 19. Bradycardia  Discussed with pharmacy, no offending agents  ECG reviewed, showing bradycardia with first-degree AV block with ?subacute infarct. No complaints of pain  Per discussion with cardiology, no changes at present per cardiology, follow-up as outpatient. 20.  Hypokalemia  Potassium 3.4 on 7/26, labs ordered for tomorrow.   Supplemented x1 day on  7/26  LOS: 16 days A FACE TO FACE EVALUATION WAS PERFORMED  Rikita Grabert [name] 03/08/2020, 9:05 AM

## 2020-03-08 NOTE — Progress Notes (Signed)
Occupational Therapy Session Note  Patient Details  Name: LIDWINA KANER MRN: [id number] Date of Birth: 06/21/1941  Today's Date: 03/08/2020 OT Individual Time: 1000-1025 OT Individual Time Calculation (min): 25 min    Short Term Goals: Week 1:  OT Short Term Goal 1 (Week 1): Pt will transfer to [facility] with maxA of one person with LRAD. OT Short Term Goal 1 - Progress (Week 1): Met OT Short Term Goal 2 (Week 1): Pt will don shirt with minA and moderate cognitive cues. OT Short Term Goal 2 - Progress (Week 1): Progressing toward goal OT Short Term Goal 3 (Week 1): Pt will dress LB with maxA with LRAD to demonstrated improved dynamic standing balance. OT Short Term Goal 3 - Progress (Week 1): Met OT Short Term Goal 4 (Week 1): Pt will attend to items located on Lt side during grooming tasks with moderate cues. OT Short Term Goal 4 - Progress (Week 1): Met  Skilled Therapeutic Interventions/Progress Updates:    1:1 Received pt in the w/c when arrived. In the gym focus on sit <> stands with and then progressing to without UE support with cues for maintaining forward weight shift. Also focus on standing balance without UE support while reaching above head and at shoulder height to retrieve clothespin on a line.   Also performed functional ambulation navigating round and in/out cones with RW about 26 feet with contact guard with cues for taking a large step on the right and keeping right foot inside the RW during turns.  Left sitting up in chair with safety belt on.  Therapy Documentation Precautions:  Precautions Precautions: Fall Restrictions Weight Bearing Restrictions: No General:   Vital Signs:  Pain: At end of session - c/o back pain and requested meds- alerted RN and left resting in the chair  Therapy/Group: Individual Therapy  Willeen Cass [facility] 03/08/2020, 1:18 PM

## 2020-03-08 NOTE — Progress Notes (Signed)
Physical Therapy Session Note  Patient Details  Name: [name] MRN: [id number] Date of Birth: February 26, 1941  Today's Date: 03/08/2020 PT Individual Time: 1100-1123 and 1345-1440 PT Individual Time Calculation (min): 23 min and 55 min  Short Term Goals: Week 2:  PT Short Term Goal 1 (Week 2): Pt will perform bed mobility with min A PT Short Term Goal 1 - Progress (Week 2): Met PT Short Term Goal 2 (Week 2): Pt will transfer bed<>chair with LRAD min A PT Short Term Goal 2 - Progress (Week 2): Met PT Short Term Goal 3 (Week 2): Pt will ambulate 26f with LRAD min A PT Short Term Goal 3 - Progress (Week 2): Met Week 3:  PT Short Term Goal 1 (Week 3): STG=LTG due to LOS  Skilled Therapeutic Interventions/Progress Updates:  Ambulation/gait training;Community reintegration;DME/adaptive equipment instruction;Neuromuscular re-education;Stair training;Psychosocial support;Wheelchair propulsion/positioning;UE/LE Strength taining/ROM;Balance/vestibular training;Discharge planning;Pain management;Skin care/wound management;Therapeutic Activities;UE/LE Coordination activities;Cognitive remediation/compensation;Disease management/prevention;Functional mobility training;Patient/family education;Splinting/orthotics;Therapeutic Exercise;Visual/perceptual remediation/compensation   Today's Interventions: Treatment Session 1: 1100-1123 23 min Received pt sitting in WC, pt agreeable to therapy, and reported 8/10 pain in low back (premedicated). Therapist provided pt with hot packs, repositioning, rest breaks, and distraction to reduce pain levels. Session with emphasis on functional mobility, generalized strengthening, and improved activity tolerance. [facility] and requested to perform seated exercises due to back pain. [facility] the following [facility] and cues for technique: -hip flexion x 12 bilaterally -knee extension x  12 bilaterally -hip adduction ball squeezes x12 -horizontal chess press at 90 degrees x9 with 4lb dowel -overhead chest press with 4lb dowel x6 [facility] Concluded session with pt sitting in WC, needs within reach, and seatbelt alarm on. Memory notebook updated.   Treatment Session 2: 1345-1440 55 min Received pt sitting in WC, pt agreeable to therapy, and reported 4/10 pain in low back (premedicated). Repositioning, rest breaks, and distraction done to reduce pain levels. Session with emphasis on functional mobility/transfers, generalized strengthening, dynamic standing balance/coordination, ambulation, stair navigation, and improved activity tolerance. RN present to administer eye drops prior to session. [facility] purposes. Pt navigated 4 steps with 2 rails mod A ascending and descending with a step to patten with cues for stepping sequence and foot placement on step. Pt transferred sit<>stand with RW CGA and ambulated 168fon uneven surfaces (ramp) with RW and min A overall with 1 LOB to R requiring mod A to prevent fall. Pt required cues for RW safety to control RW when descending ramp. Pt transferred stand<>pivot with RW to/from mat x 2 trials with CGA. Pt transferred sit<>stand with RW CGA and worked on dynamic standing balance performing alternating toe taps to 6in step 1x14 and 1x16 with bilateral UE support on RW and min A for balance. Pt required multiple rest breaks throughout session due to increased fatigue. Worked on static single leg balance with min A and bilateral UE support on RW. Pt able to stand on L LE approximately 15 seconds and quickly lift 1 UE off RW and able to stand approximately 10 seconds on R LE and quickly lift 1 UE off RW. Pt returned to sitting position and stated "I can't do anymore I'm too tired". [facility] and transferred WC<>bed stand<>pivot with RW  and CGA. Doffed sweater and shoes sitting  EOB max A and donned slipper socks total A. Sit<>supine with supervision. Concluded session with pt supine in bed, needs within reach, and bed alarm on. Therapist provided fresh ice water for pt and memory notebook updated.   Therapy Documentation Precautions:  Precautions Precautions: Fall Restrictions Weight Bearing Restrictions: No  Therapy/Group: Individual Therapy Alfonse Alpers PT, DPT   03/08/2020, 7:27 AM

## 2020-03-08 NOTE — Progress Notes (Signed)
Physical Therapy Weekly Progress Note  Patient Details  Name: [name] MRN: [id number] Date of Birth: 07/28/1941  Beginning of progress report period: February 22, 2020 End of progress report period: March 08, 2020  Patient has met 3 of 3 short term goals. Pt is currently making steady progress towards long term goals. Long term goals recently upgraded to CGA/supervision and D/C date moved up to 8/1 due to improved activity tolerance, increased strength/endurance, and improved balance/postural control. Pt currently requires supervision for bed mobility, min A for transfers with RW, min A to ambulate 83f with RW, and supervision for WC mobility >1572f However, pt continues to demonstrate UE/LE strength and balance impairments and fatigues easily.   Patient continues to demonstrate the following deficits muscle weakness, impaired timing and sequencing and decreased coordination and decreased standing balance, decreased postural control and decreased balance strategies and therefore will continue to benefit from skilled PT intervention to increase functional independence with mobility.  Patient progressing toward long term goals..  Continue plan of care.  PT Short Term Goals Week 2:  PT Short Term Goal 1 (Week 2): Pt will perform bed mobility with min A PT Short Term Goal 1 - Progress (Week 2): Met PT Short Term Goal 2 (Week 2): Pt will transfer bed<>chair with LRAD min A PT Short Term Goal 2 - Progress (Week 2): Met PT Short Term Goal 3 (Week 2): Pt will ambulate 2557fith LRAD min A PT Short Term Goal 3 - Progress (Week 2): Met Week 3:  PT Short Term Goal 1 (Week 3): STG=LTG due to LOS  Skilled Therapeutic Interventions/Progress Updates:  Ambulation/gait training;Community reintegration;DME/adaptive equipment instruction;Neuromuscular re-education;Stair training;Psychosocial support;Wheelchair propulsion/positioning;UE/LE Strength taining/ROM;Balance/vestibular training;Discharge  planning;Pain management;Skin care/wound management;Therapeutic Activities;UE/LE Coordination activities;Cognitive remediation/compensation;Disease management/prevention;Functional mobility training;Patient/family education;Splinting/orthotics;Therapeutic Exercise;Visual/perceptual remediation/compensation   Therapy Documentation Precautions:  Precautions Precautions: Fall Restrictions Weight Bearing Restrictions: No  Therapy/Group: Individual Therapy AnnAlfonse Alpers, DPT   03/08/2020, 7:23 AM

## 2020-03-08 NOTE — Patient Care Conference (Signed)
Inpatient RehabilitationTeam Conference and Plan of Care Update Date: 03/08/2020   Time: 11:45    Patient Name: [name]      Medical Record Number: [id number]  Date of Birth: 08-06-1941 Sex: Female         Room/Bed: 4W03C/4W03C-01 Payor Info: Payor: MEDICARE / Plan: MEDICARE PART A AND B / Product Type: *No Product type* /    Admit Date/Time:  02/21/2020  3:52 PM  Primary Diagnosis:  Stroke [facility])  Hospital Problems: Principal Problem:   Stroke (HCC) L>R pontine and L cerebellar embolic infarcts, source unknown s/p tPA Active Problems:   Left pontine cerebrovascular accident (HCC)   Hypoalbuminemia due to protein-calorie malnutrition (HCC)   Benign essential HTN   Slow transit constipation   Dysphagia, post-stroke   Stage 2 chronic kidney disease   Sleep disturbance   Drug induced constipation   First degree AV block   Drug-induced hypotension   History of hypertension   Labile blood pressure   Hypokalemia    Expected Discharge Date: Expected Discharge Date: 03/12/20  Team Members Present: Physician leading conference: Dr. Maryla Morrow Care Coodinator Present: Chana Bode, RN, BSN, CRRN;Cecile Sheerer, LCSWA Nurse Present: Other (comment) Conley Canal, RN) PT Present: Raechel Chute, PT OT Present: Roney Mans, OT SLP Present: Suzzette Righter, CF-SLP PPS Coordinator present : Fae Pippin, [place], PT     Current Status/Progress Goal Weekly Team Focus  Bowel/Bladder   Patient is continent of bowel and bladder. LBM 03/07/20  Maintain continence of bowel and bladder  Assist patient with PRN toileting   Swallow/Nutrition/ Hydration   dys 3 textures, thin liquids, Supervision A  Mod I  carryover swallow strategies   ADL's   minA stand pivots, UB ADLs supervision, LB ADLs mod  minA mobility & ADLs, w/ sup groom & sit  ADl retraining, sitting and standing balance, functional transfers, Lt attention   Mobility   bed mobility supervision, transfers  min/mod A, gait 51ft min/mod A, WC mobility 144ft supervision  min A, supervision WC mobility  functional mobility/transfers, dynamic standing balance/coordination, ambulation, endurance   Communication   Supervision A 90-95% intelligible  Mod I  speech intelligibility strategies   Safety/Cognition/ Behavioral Observations  Supervision-Min  Mod I basic  basic problem solving, error awareness, memory strategies   Pain   patient denies pain or discomfort  maintain pain free with or without activity  Q shift and PRN pain assessment   Skin   Patient has no skin issues  maintain good skin condition and prevent further skin breakdown  Q shift and PRN skin assessment     Team Discussion:  Discharge Planning/Teaching Needs:  D/c to son [name]'s home.  Family education as recommended by therapy. Family edu 7/30 and 8/2 10am-12pm with son.   Current Update:  Follow up HH; PT, OT, SLP recommended  Current Barriers to Discharge:  Decreased caregiver support  Possible Resolutions to Barriers: Family education with son  Patient on target to meet rehab goals: yes, slow and steady progress noted  *See Care Plan and progress notes for long and short-term goals.   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 Weekly Focus/Goal: Improve mobility, swallowing, hypokalemia, BP, chronic pain, bradycardia  Barriers to Discharge: Medical stability;Incontinence   Possible Resolutions to Barriers: Therapies, encourage fluids, follow up labs, optimize BP  meds, supplmented K+, follow up outpatient for bradycardia per Cards   Continued Need for Acute Rehabilitation Level of Care: The patient requires daily medical management by a physician with specialized training in physical medicine and rehabilitation for the  following reasons: Direction of a multidisciplinary physical rehabilitation program to maximize functional independence : Yes Medical management of patient stability for increased activity during participation in an intensive rehabilitation regime.: Yes Analysis of laboratory values and/or radiology reports with any subsequent need for medication adjustment and/or medical intervention. : Yes   I attest that I was present, lead the team conference, and concur with the assessment and plan of the team.   Chana Bode B 03/08/2020, 4:09 PM

## 2020-03-08 NOTE — Progress Notes (Signed)
Physical Therapy Session Note  Patient Details  Name: [name] MRN: [id number] Date of Birth: 23-Apr-1941  Today's Date: 03/08/2020 PT Individual Time: 9914-4458 PT Individual Time Calculation (min): 30 min   Short Term Goals: Week 2:  PT Short Term Goal 1 (Week 2): Pt will perform bed mobility with min A PT Short Term Goal 1 - Progress (Week 2): Met PT Short Term Goal 2 (Week 2): Pt will transfer bed<>chair with LRAD min A PT Short Term Goal 2 - Progress (Week 2): Met PT Short Term Goal 3 (Week 2): Pt will ambulate 97f with LRAD min A PT Short Term Goal 3 - Progress (Week 2): Met Week 3:  PT Short Term Goal 1 (Week 3): STG=LTG due to LOS  Skilled Therapeutic Interventions/Progress Updates:  Pt presents in bed, agreeable to session. Denies need to toilet stating she had already went to the bathroom this morning. Supervision to come to EOB using bedrail for support. Pt able to don pants EOB with supervision including sit <> stand with RW to pull up pants. CGA for functional dynamic standing balance. CGA for transfer to w/c with RW with cues for safe placement of RW. Pt able to don shoes with extra time with set-up assist. At sink engaged in grooming tasks at supervision level for set-up assistance. Functional gait training x 26' with min assist and cues for upright posture and trunk extension. Pt performs standing heel and toe raises for functional strengthening an dbalance x 10 reps each with min assist for balance and limited ROM/strength. Demonstrated in seated as a option as well for independent exercise. Blocked practice sit <> stands with close supervision for functional strengthening x 5 reps.   Utilized mSocial workerat end of session for recall. Difficulty with recall of beginning of session.   Therapy Documentation Precautions:  Precautions Precautions: Fall Restrictions Weight Bearing Restrictions: No  Pain: Denies pain.     Therapy/Group: Individual  Therapy  GCanary BrimBIvory Broad PT, DPT, CBIS  03/08/2020, 10:46 AM

## 2020-03-08 NOTE — Progress Notes (Addendum)
Patient ID: [name], female   DOB: [birth year], 79 y.o.   MRN: [id number]  This SW covering for primary SW, Lavera Guise.   SW left message for pt son [name] [phone]) to inform on change in d/c date after team conference, and suggested family education remaining on 7/30 10am-12pm, and Saturday same time if possible. SW requested return phone call.  SW updated [facility] on change in pt d/c date.   SW received call from pt son [name] indicating that he spoke with his mother on above and did not get SW message. SW reviewed above. He reported since pt is discharging earlier than expected she does not have a bed and will have to d/c to her home. States that the niece is in the home and she works third shift 6pm-4am. States no one will be with her during this time period. SW discussed with son possibly staying with pt overnight, he said this was not an option, however, would be willing to stay at the home from 6pm-9pm. SW informed will follow-up if intermittent support will be appropriate for pt and discuss tomorrow. SWinformed will order DME 3in1 BSC and TTB. He indicated that pt may need a RW. SW indicated will follow-up. He confirms he will be here for fam edu on Friday 10am-12pm with his wife.   SW spoke with front desk to add pt dtr in law Melodie Bouillon for [employer].   Cecile Sheerer, MSW, LCSWA Office: [phone] Cell: [phone] Fax: [phone]

## 2020-03-08 NOTE — Progress Notes (Signed)
Occupational Therapy Weekly Progress Note  Patient Details  Name: [name] MRN: [id number] Date of Birth: 11-01-1940  Beginning of progress report period: March 02, 2020 End of progress report period: March 09, 2020  Today's Date: 03/09/2020      Patient has met 3 of 4 short term goals. Pt is making steady progress towards goals. Pt is completing UB dressing/bathing with supervision. Pt is donning pants with minA to assist with threading BLE and providing balance support to advance pants over waist due to deficits in balance and endurance. Pt continues to require cues to visually scan to Lt during mobility and ADLs. Pt is ambulating short distances with RW with CGA with endurance remaining a barrier to progress. Pt would continue to benefit from ADL retraining, endurance training, and visual scanning interventions to increase safety during functional mobility to progress towards goals. Family education scheduled for 7/30-8/1.  Patient continues to demonstrate the following deficits: muscle weakness, decreased cardiorespiratoy endurance, decreased visual perceptual skills, decreased attention to left, decreased awareness, decreased problem solving and decreased memory and decreased sitting balance, decreased standing balance and decreased postural control and therefore will continue to benefit from skilled OT intervention to enhance overall performance with BADL.  Patient progressing toward long term goals.  Continue plan of care.  OT Short Term Goals Week 2:  OT Short Term Goal 1 (Week 2): Pt will don pants with minA to demonstrsate increase dynamic standing balance. OT Short Term Goal 1 - Progress (Week 2): Met OT Short Term Goal 2 (Week 2): Pt will attend to L visual field during functional mobility with min cues. OT Short Term Goal 2 - Progress (Week 2): Progressing toward goal OT Short Term Goal 3 (Week 2): Pt will complete functional transfers with minA for 2 consecutive  sessions. OT Short Term Goal 3 - Progress (Week 2): Met OT Short Term Goal 4 (Week 2): Pt will don shirt with minA and moderate cognitive cues. OT Short Term Goal 4 - Progress (Week 2): Met Week 3:  OT Short Term Goal 1 (Week 3): STG = LTGs due to ELOS   Therapy Documentation Precautions:  Precautions Precautions: Fall Restrictions Weight Bearing Restrictions: No General:   Vital Signs: Therapy Vitals Temp: 98.2 F (36.8 C) Pulse Rate: (!) 44 Resp: 18 BP: (!) 147/127 Patient Position (if appropriate): Sitting Oxygen Therapy SpO2: 97 % O2 Device: Room Air Pain:    Therapy/Group: Individual Therapy  Michelle Nasuti 03/08/2020, 4:41 PM

## 2020-03-09 ENCOUNTER — Inpatient Hospital Stay (HOSPITAL_COMMUNITY): Payer: Medicare Other | Admitting: Occupational Therapy

## 2020-03-09 ENCOUNTER — Inpatient Hospital Stay (HOSPITAL_COMMUNITY): Payer: Medicare Other

## 2020-03-09 DIAGNOSIS — D539 Nutritional anemia, unspecified: Secondary | ICD-10-CM

## 2020-03-09 LAB — CBC WITH DIFFERENTIAL/PLATELET
Abs Immature Granulocytes: 0.02 10*3/uL (ref 0.00–0.07)
Basophils Absolute: 0 10*3/uL (ref 0.0–0.1)
Basophils Relative: 1 %
Eosinophils Absolute: 0.2 10*3/uL (ref 0.0–0.5)
Eosinophils Relative: 3 %
HCT: 35.2 % — ABNORMAL LOW (ref 36.0–46.0)
Hemoglobin: 11.1 g/dL — ABNORMAL LOW (ref 12.0–15.0)
Immature Granulocytes: 0 %
Lymphocytes Relative: 33 %
Lymphs Abs: 2 10*3/uL (ref 0.7–4.0)
MCH: 32.2 pg (ref 26.0–34.0)
MCHC: 31.5 g/dL (ref 30.0–36.0)
MCV: 102 fL — ABNORMAL HIGH (ref 80.0–100.0)
Monocytes Absolute: 0.5 10*3/uL (ref 0.1–1.0)
Monocytes Relative: 8 %
Neutro Abs: 3.3 10*3/uL (ref 1.7–7.7)
Neutrophils Relative %: 55 %
Platelets: 160 10*3/uL (ref 150–400)
RBC: 3.45 MIL/uL — ABNORMAL LOW (ref 3.87–5.11)
RDW: 13.5 % (ref 11.5–15.5)
WBC: 6.1 10*3/uL (ref 4.0–10.5)
nRBC: 0 % (ref 0.0–0.2)

## 2020-03-09 LAB — FOLATE: Folate: 12.6 ng/mL (ref >5.9)

## 2020-03-09 LAB — BASIC METABOLIC PANEL
Anion gap: 8 (ref 5–15)
BUN: 15 mg/dL (ref 8–23)
CO2: 25 mmol/L (ref 22–32)
Calcium: 8.8 mg/dL — ABNORMAL LOW (ref 8.9–10.3)
Chloride: 109 mmol/L (ref 98–111)
Creatinine, Ser: 1.03 mg/dL — ABNORMAL HIGH (ref 0.44–1.00)
GFR calc Af Amer: 60 mL/min — ABNORMAL LOW (ref >60)
GFR calc non Af Amer: 52 mL/min — ABNORMAL LOW (ref >60)
Glucose, Bld: 84 mg/dL (ref 70–99)
Potassium: 3.4 mmol/L — ABNORMAL LOW (ref 3.5–5.1)
Sodium: 142 mmol/L (ref 135–145)

## 2020-03-09 LAB — VITAMIN B12: Vitamin B-12: 754 pg/mL (ref 180–914)

## 2020-03-09 MED ORDER — MELATONIN 3 MG PO TABS: 3.0000 mg | ORAL_TABLET | Freq: Every day | ORAL | 0 refills | Status: DC

## 2020-03-09 MED ORDER — OXYCODONE HCL 15 MG PO TABS: 15.0000 mg | ORAL_TABLET | Freq: Four times a day (QID) | ORAL | 0 refills | Status: DC | PRN

## 2020-03-09 MED ORDER — POTASSIUM CHLORIDE CRYS ER 20 MEQ PO TBCR
30.0000 meq | EXTENDED_RELEASE_TABLET | Freq: Two times a day (BID) | ORAL | Status: AC
  Administered 2020-03-09 (×2): 30 meq via ORAL
  Filled 2020-03-09 (×2): qty 1

## 2020-03-09 MED ORDER — CLOPIDOGREL BISULFATE 75 MG PO TABS: 75.0000 mg | ORAL_TABLET | Freq: Every day | ORAL | 0 refills | Status: DC

## 2020-03-09 MED ORDER — DONEPEZIL HCL 10 MG PO TABS: 10.0000 mg | ORAL_TABLET | Freq: Every day | ORAL | 0 refills | Status: DC

## 2020-03-09 MED ORDER — ATORVASTATIN CALCIUM 20 MG PO TABS: 20.0000 mg | ORAL_TABLET | Freq: Every day | ORAL | 0 refills | Status: DC

## 2020-03-09 MED ORDER — BUSPIRONE HCL 30 MG PO TABS: 30.0000 mg | ORAL_TABLET | Freq: Two times a day (BID) | ORAL | 0 refills | Status: DC

## 2020-03-09 MED ORDER — MEMANTINE HCL ER 28 MG PO CP24: 28.0000 mg | ORAL_CAPSULE | Freq: Every day | ORAL | 0 refills | Status: AC

## 2020-03-09 MED ORDER — POLYETHYLENE GLYCOL 3350 17 G PO PACK: 17.0000 g | PACK | Freq: Two times a day (BID) | ORAL | 0 refills | Status: DC

## 2020-03-09 MED ORDER — LISINOPRIL 5 MG PO TABS: 5.0000 mg | ORAL_TABLET | Freq: Every day | ORAL | 0 refills | Status: DC

## 2020-03-09 MED ORDER — MIRTAZAPINE 15 MG PO TABS: 15.0000 mg | ORAL_TABLET | ORAL | 0 refills | Status: DC | PRN

## 2020-03-09 MED ORDER — VITAMIN D 25 MCG (1000 UNIT) PO TABS: 1000.0000 [IU] | ORAL_TABLET | Freq: Every day | ORAL | 0 refills | Status: DC

## 2020-03-09 NOTE — Discharge Summary (Signed)
Physician Discharge Summary  Patient ID: [name] MRN: [id number] DOB/AGE: 03-06-1941 79 y.o.  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, source unknown s/p tPA Active Problems:   Left pontine cerebrovascular accident (HCC)   Hypoalbuminemia due to protein-calorie malnutrition (HCC)   Benign essential HTN   Slow transit constipation   Dysphagia, post-stroke   Stage 2 chronic kidney disease   Sleep disturbance   Drug induced constipation   First degree AV block   Drug-induced hypotension   History of hypertension   Labile blood pressure   Hypokalemia   Macrocytic anemia Obesity  Discharged Condition: Stable  Significant Diagnostic Studies: CT Code Stroke CTA Head W/WO contrast  Result Date: 02/16/2020 CLINICAL DATA:  Right facial droop EXAM: CT ANGIOGRAPHY HEAD AND NECK TECHNIQUE: Multidetector CT imaging of the head and neck was performed using the standard protocol during bolus administration of intravenous contrast. Multiplanar CT image reconstructions and MIPs were obtained to evaluate the vascular anatomy. Carotid stenosis measurements (when applicable) are obtained utilizing NASCET criteria, using the distal internal carotid diameter as the denominator. CONTRAST:  75 mL Omnipaque 350 COMPARISON:  None. FINDINGS: CTA NECK Aortic arch: Great vessel origins are patent. Right carotid system: Patent. Minimal calcified plaque at the ICA origin without measurable stenosis. Mild tortuosity of the distal cervical ICA. Left carotid system: Patent. Trace calcified plaque at the ICA origin without measurable stenosis. Mild tortuosity of the distal cervical ICA. Vertebral arteries: Patent.  No measurable stenosis. Skeleton: Cervical spine degenerative changes. Other neck: No mass or adenopathy. Upper chest: No apical lung mass. Dilatation of the main pulmonary artery reflecting  pulmonary arterial hypertension. Review of the MIP images confirms the above findings CTA HEAD Anterior circulation: Intracranial internal carotid arteries are patent with mild atherosclerotic irregularity. Anterior and middle cerebral arteries are patent. Posterior circulation: Intracranial vertebral arteries, basilar artery, and posterior cerebral arteries are patent. PICA, AICA, and superior cerebellar artery origins are patent. There are patent bilateral posterior communicating arteries. Venous sinuses: Patent as allowed by contrast bolus timing. Review of the MIP images confirms the above findings IMPRESSION: No large vessel occlusion or hemodynamically significant stenosis. Electronically Signed   By: Guadlupe Spanish M.D.   On: 02/16/2020 22:02   CT HEAD WO CONTRAST  Result Date: 02/17/2020 CLINICAL DATA:  79 year old female code stroke presentation yesterday, right facial droop. Small age indeterminate left cerebellar SCA infarct on plain head CT. CTA negative for large vessel occlusion or stenosis. EXAM: CT HEAD WITHOUT CONTRAST TECHNIQUE: Contiguous axial images were obtained from the base of the skull through the vertex without intravenous contrast. COMPARISON:  CT head, CTA head and neck yesterday, and earlier. 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 No other intracranial mass effect. No midline shift. No ventriculomegaly. Stable gray-white matter differentiation throughout the brain. Small left SCA territory infarct appears unchanged. Largely normal for age gray-white differentiation elsewhere. No cortically based acute infarct identified. No acute intracranial hemorrhage identified. Vascular: Mild Calcified atherosclerosis at the skull base. Skull: No acute osseous abnormality identified. Hyperostosis of the calvarium. Sinuses/Orbits: Visualized paranasal sinuses and mastoids are stable and well pneumatized. Other: No acute orbit or scalp soft tissue  findings. IMPRESSION: 1. Stable since yesterday. No cortically based infarct or acute intracranial hemorrhage identified. 2. Small left SCA territory infarct, new since 2018 but favor chronic. Electronically Signed   By: [name]  M.D.   On: 02/17/2020 12:45   CT Code Stroke CTA Neck W/WO contrast  Result Date: 02/16/2020 CLINICAL DATA:  Right facial droop EXAM: CT ANGIOGRAPHY HEAD AND NECK TECHNIQUE: Multidetector CT imaging of the head and neck was performed using the standard protocol during bolus administration of intravenous contrast. Multiplanar CT image reconstructions and MIPs were obtained to evaluate the vascular anatomy. Carotid stenosis measurements (when applicable) are obtained utilizing NASCET criteria, using the distal internal carotid diameter as the denominator. CONTRAST:  75 mL Omnipaque 350 COMPARISON:  None. FINDINGS: CTA NECK Aortic arch: Great vessel origins are patent. Right carotid system: Patent. Minimal calcified plaque at the ICA origin without measurable stenosis. Mild tortuosity of the distal cervical ICA. Left carotid system: Patent. Trace calcified plaque at the ICA origin without measurable stenosis. Mild tortuosity of the distal cervical ICA. Vertebral arteries: Patent.  No measurable stenosis. Skeleton: Cervical spine degenerative changes. Other neck: No mass or adenopathy. Upper chest: No apical lung mass. Dilatation of the main pulmonary artery reflecting pulmonary arterial hypertension. Review of the MIP images confirms the above findings CTA HEAD Anterior circulation: Intracranial internal carotid arteries are patent with mild atherosclerotic irregularity. Anterior and middle cerebral arteries are patent. Posterior circulation: Intracranial vertebral arteries, basilar artery, and posterior cerebral arteries are patent. PICA, AICA, and superior cerebellar artery origins are patent. There are patent bilateral posterior communicating arteries. Venous sinuses: Patent as allowed by  contrast bolus timing. Review of the MIP images confirms the above findings IMPRESSION: No large vessel occlusion or hemodynamically significant stenosis. Electronically Signed   By: Guadlupe Spanish M.D.   On: 02/16/2020 22:02   MR BRAIN WO CONTRAST  Result Date: 02/17/2020 CLINICAL DATA:  Sudden onset of slurred speech and double vision EXAM: MRI HEAD WITHOUT CONTRAST TECHNIQUE: Multiplanar, multiecho pulse sequences of the brain and surrounding structures were obtained without intravenous contrast. COMPARISON:  02/02/2014 brain MRI FINDINGS: Brain: There is an acute infarct of the pons, left-greater-than-right. There is also punctate focus of acute ischemia in the left cerebellum. No acute ischemia of the midbrain or above. There is multifocal periventricular white matter hyperintensity, most often a result of chronic microvascular ischemia. Normal CSF volume. Small amount of hemosiderin at the inferior pons. Foci of chronic microhemorrhage in the left parietal and occipital lobes. Vascular: Normal flow voids. Skull and upper cervical spine: Normal marrow signal. Sinuses/Orbits: Negative. Other: None. IMPRESSION: 1. Acute bilateral pontine infarcts, left-greater-than-right. 2. Punctate focus of acute ischemia in the left cerebellum. 3. Findings of chronic microvascular ischemia. Electronically Signed   By: Deatra Robinson M.D.   On: 02/17/2020 22:45   CT HEAD CODE STROKE WO CONTRAST  Result Date: 02/16/2020 CLINICAL DATA:  Code stroke.  Right facial droop EXAM: CT HEAD WITHOUT CONTRAST TECHNIQUE: Contiguous axial images were obtained from the base of the skull through the vertex without intravenous contrast. COMPARISON:  2018 FINDINGS: Brain: There is no acute intracranial hemorrhage, mass effect, or edema. No new loss of gray-white differentiation. There is an age-indeterminate small infarct of the superior left cerebellum. Ventricles and sulci are normal in size and configuration. Patchy hypoattenuation in  the supratentorial white matter is nonspecific but may reflect mild chronic microvascular ischemic changes. There is no extra-axial fluid collection Vascular: There is no hyperdense vessel. Mild intracranial atherosclerotic calcification at the skull base. Skull: Unremarkable. Sinuses/Orbits: No acute abnormality. Other: Mastoid air cells are clear. ASPECTS [facility] Stroke Program Early CT Score) - Ganglionic level infarction (caudate, lentiform nuclei, internal capsule, insula,  M1-M3 cortex): 7 - Supraganglionic infarction (M4-M6 cortex): 3 Total score (0-10 with 10 being normal): 10 IMPRESSION: There is no acute intracranial hemorrhage or evidence of definite acute infarction. ASPECT score is 10. New age-indeterminate small infarct of the superior left cerebellum. These results were communicated to Dr. Laurence Slate at 9:50 pmon 7/7/2021by text page via the [facility] messaging system. Electronically Signed   By: Guadlupe Spanish M.D.   On: 02/16/2020 21:57   VAS [place] LOWER EXTREMITY VENOUS (DVT)  Result Date: 02/21/2020  Lower Venous DVT Study Indications: Stroke.  Limitations: Postioning secondary to stroke. Comparison Study: Prior study from 09/30/16 is available for comparison Performing Technologist: Sherren Kerns RVS  Examination Guidelines: A complete evaluation includes B-mode imaging, spectral Doppler, color Doppler, and power Doppler as needed of all accessible portions of each vessel. Bilateral testing is considered an integral part of a complete examination. Limited examinations for reoccurring indications may be performed as noted. The reflux portion of the exam is performed with the patient in reverse Trendelenburg.  +---------+---------------+---------+-----------+----------+--------------+ RIGHT    CompressibilityPhasicitySpontaneityPropertiesThrombus Aging +---------+---------------+---------+-----------+----------+--------------+ CFV      Full           Yes      Yes                                  +---------+---------------+---------+-----------+----------+--------------+ SFJ      Full                                                        +---------+---------------+---------+-----------+----------+--------------+ FV Prox  Full                                                        +---------+---------------+---------+-----------+----------+--------------+ FV Mid   Full                                                        +---------+---------------+---------+-----------+----------+--------------+ FV DistalFull                                                        +---------+---------------+---------+-----------+----------+--------------+ PFV      Full                                                        +---------+---------------+---------+-----------+----------+--------------+ POP      Full           Yes      Yes                                 +---------+---------------+---------+-----------+----------+--------------+  PTV      Full                                                        +---------+---------------+---------+-----------+----------+--------------+ PERO     Full                                                        +---------+---------------+---------+-----------+----------+--------------+   +---------+---------------+---------+-----------+----------+--------------+ LEFT     CompressibilityPhasicitySpontaneityPropertiesThrombus Aging +---------+---------------+---------+-----------+----------+--------------+ CFV      Full           Yes      Yes                                 +---------+---------------+---------+-----------+----------+--------------+ SFJ      Full                                                        +---------+---------------+---------+-----------+----------+--------------+ FV Prox  Full                                                         +---------+---------------+---------+-----------+----------+--------------+ FV Mid   Full                                                        +---------+---------------+---------+-----------+----------+--------------+ FV DistalFull                                                        +---------+---------------+---------+-----------+----------+--------------+ PFV      Full                                                        +---------+---------------+---------+-----------+----------+--------------+ POP      Full           Yes      Yes                                 +---------+---------------+---------+-----------+----------+--------------+ PTV      Full                                                        +---------+---------------+---------+-----------+----------+--------------+  PERO     Full                                                        +---------+---------------+---------+-----------+----------+--------------+   Left Technical Findings: Not visualized segments include portions of the posterior tibial and peroneal veins.   Summary: RIGHT: - Findings appear essentially unchanged compared to previous examination. - There is no evidence of deep vein thrombosis in the lower extremity.  LEFT: - Findings appear essentially unchanged compared to previous examination. - There is no evidence of deep vein thrombosis in the lower extremity. However, portions of this examination were limited- see technologist comments above.  *See table(s) above for measurements and observations. Electronically signed by Coral Else MD on 02/21/2020 at 6:32:48 AM.    Final     Labs:  Basic Metabolic Panel: Recent Labs  Lab 03/09/20 0448  NA 142  K 3.4*  CL 109  CO2 25  GLUCOSE 84  BUN 15  CREATININE 1.03*  CALCIUM 8.8*    CBC: Recent Labs  Lab 03/09/20 0448  WBC 6.1  NEUTROABS 3.3  HGB 11.1*  HCT 35.2*  MCV 102.0*  PLT 160    CBG: No results for  input(s): GLUCAP in the last 168 hours.  Family history.  Father with reported kidney problems.  Denies any hypertension hyperlipidemia esophageal cancer or rectal cancer  Brief HPI:   [name] is a 79 y.o. right-handed female with history of hypertension, hyperlipidemia, mild dementia maintained on Aricept as well as Namenda, chronic back pain maintained on oxycodone as needed, DVT, cardiomyopathy, struct of sleep apnea on CPAP.  Patient lives with her granddaughter 1 level home 6 steps to entry.  Her daughter works at night however patient states that her son is in the area as well as daughter-in-law that works different shifts.  Ambulates with a rolling walker.  Presented 02/16/2020 left facial droop dysarthria and visual changes.  MRI of the brain showed left greater than right brainstem infarction.  Per CT and MRI showed acute bilateral pontine infarcts left greater than right.  Punctate focus of acute ischemia in the left cerebellum.  CT angiogram of head and neck with no large vessel occlusion.  Patient did receive TPA.  Echocardiogram with ejection fraction 55 to 60% without embolus.  Admission chemistries unremarkable..  EKG normal sinus rhythm.  Maintain on aspirin and Plavix for 3 weeks then Plavix alone.  Subcutaneous Lovenox for DVT prophylaxis.  Venous Doppler studies negative.  Patient did undergo loop recorder placement.  Patient was admitted for a comprehensive rehab program.   Hospital Course: ELYN KROGH was admitted to rehab 02/21/2020 for inpatient therapies to consist of PT, ST and OT at least three hours five days a week. Past admission physiatrist, therapy team and rehab RN have worked together to provide customized collaborative inpatient rehab.  Pertaining to patient left greater than right pontine and left cerebellar infarction remained stable status post TPA as well as loop recorder placement.  She would follow-up with neurology services.  She remained on aspirin and  Plavix x3 weeks then Plavix alone.  Subcutaneous Lovenox for DVT prophylaxis vascular studies negative.  Mood stabilization with melatonin and Remeron as well as BuSpar.  Blood pressure controlled on lisinopril amlodipine she would follow-up with primary MD.  Lipitor ongoing for  hyperlipidemia.  Obesity with BMI 35.56 and dietary follow-up.  Patient with history of Takotsubo cardiomyopathy no chest pain or shortness of breath however she did have some bradycardia EKG showed first-degree AV block reviewed by cardiology services no changes currently made and she would follow-up with cardiology services.  Noted history of dementia with Namenda and Aricept ongoing as prior to admission.  Reported history of CKD stage III latest creatinine 1.03.  She did have a history of obstructive sleep apnea she remained on CPAP.   Blood pressures were monitored on TID basis and controlled     Rehab course: During patient's stay in rehab weekly team conferences were held to monitor patient's progress, set goals and discuss barriers to discharge. At admission, patient required moderate assist ambulate 1 foot rolling walker moderate assist stand pivot transfers moderate assist sit to stand.  Moderate assist upper body bathing max assist lower body bathing max assist upper body dressing total assist lower body dressing  Physical exam.  Blood pressure 128/70 pulse 70 temperature 96 respirations 18 oxygen saturation 90% room air General.  Alert oriented no distress HEENT Head.  Normocephalic and atraumatic Eyes.  Pupils round and reactive to light no discharge.nystagmus Neck.  Supple nontender no JVD without thyromegaly Cardiac regular rate rhythm without any extra sounds or murmur heard Respiratory effort normal no respiratory distress without wheeze Abdomen.  Soft nontender positive bowel sounds without rebound Extremities no clubbing cyanosis or edema Neuro.  Alert makes good eye contact mild left facial droop  speech dysarthric but intelligible oriented x3 and follows commands.  Motor strength 4+/5 bilateral lower extremities right upper extremity 4/5 left lower extremity 4/5 throughout.  /She  has had improvement in activity tolerance, balance, postural control as well as ability to compensate for deficits. Francis Dowse has had improvement in functional use RUE/LUE  and RLE/LLE as well as improvement in awareness.  Working with energy conservation techniques as well as family teaching.  Transferred supine to sitting edge of bed head of bed elevated with use of bed rail supervision.  Ambulates 64 feet rolling walker contact-guard assist.  Propelled her wheelchair supervision.  She can put on her slippers and stockings with max assist.  Transfer bed to wheelchair to standing pivot transfers rolling walker contact-guard assist.  She was able to fully communicate her needs.  Full family teaching completed plan discharged home       Disposition: Discharged home    Diet: Mechanical soft Special Instructions: No driving smoking or alcohol 1.  Tylenol as needed 2.  Aspirin 81 mg p.o. daily x2 days and stop 3.  Lipitor 20 mg p.o. nightly 4.  BuSpar 30 mg p.o. twice daily 5.  Vitamin D 1000 units p.o. daily 6.  Plavix 75 mg p.o. daily 7.  Aricept 10 mg p.o. nightly 8.  Lisinopril 5 mg p.o. daily 9.  Melatonin 3 mg p.o. nightly 10.  Namenda 28 mg p.o. daily 11.  Remeron 15 mg p.o. nightly as needed 12.  Prosite multivitamin daily 13.  Oxycodone 15 mg every 4 hours as needed pain 14.  MiraLAX twice daily hold for loose stools 15.Lacrilube apply left eye QHS  30-35 minutes were spent completing discharge summary and discharge planning  Discharge Instructions    Ambulatory referral to Neurology   Complete by: As directed    An appointment is requested in approximately 4 weeks left greater than right pontine as well as left cerebellar infarction   Ambulatory referral to Physical Medicine Rehab  Complete by:  As directed    Moderate complexity follow-up 1 to 2 weeks left greater than right pontine infarction       Follow-up Information    Marcello Fennel, MD Follow up.   Specialty: Physical Medicine and Rehabilitation Why: Office to call for appointment Contact information: [address] [place] [address] [phone]        Duke Salvia, MD Follow up.   Specialty: Cardiology Why: Call for appointment Contact information: [address] [place] [address] [phone]               Signed: Charlton Amor 03/15/2020, 4:57 PM

## 2020-03-09 NOTE — Progress Notes (Signed)
Occupational Therapy Session Note  Patient Details  Name: NGA RABON MRN: [id number] Date of Birth: [birth year]  Today's Date: 03/09/2020 OT Individual Time: 1032-1100 OT Individual Time Calculation (min): 28 min    Short Term Goals: Week 3:  OT Short Term Goal 1 (Week 3): STG = LTGs due to ELOS  Skilled Therapeutic Interventions/Progress Updates:    Pt greeted seated in wc and agreeable to OT treatment session. Pt brought down to therapy gym in wc. UB there-ex focused on B shoulder mobility using towel pushes across table. Pt then worked on Armed forces logistics/support/administrative officer, fine motor coordination, and problem solving with peg board puzzle. Pt needed min cues intially to follow color code, then she was able to accurately complete 2 semi-complex puzzles. Pt returned to room and completed stand-pivot back to bed with CGA. Pt left semi-reclined in bed with bed alarm on, call bell in reach, and needs met.   Therapy Documentation Precautions:  Precautions Precautions: Fall Restrictions Weight Bearing Restrictions: No Pain:  pt denies pain  Therapy/Group: Individual Therapy  Valma Cava 03/09/2020, 11:02 AM

## 2020-03-09 NOTE — Progress Notes (Signed)
Physical Therapy Session Note  Patient Details  Name: [name] MRN: [id number] Date of Birth: January 17, 1941  Today's Date: 03/09/2020 PT Individual Time: 0800-0855 PT Individual Time Calculation (min): 55 min   Short Term Goals: Week 2:  PT Short Term Goal 1 (Week 2): Pt will perform bed mobility with min A PT Short Term Goal 1 - Progress (Week 2): Met PT Short Term Goal 2 (Week 2): Pt will transfer bed<>chair with LRAD min A PT Short Term Goal 2 - Progress (Week 2): Met PT Short Term Goal 3 (Week 2): Pt will ambulate 40f with LRAD min A PT Short Term Goal 3 - Progress (Week 2): Met Week 3:  PT Short Term Goal 1 (Week 3): STG=LTG due to LOS  Skilled Therapeutic Interventions/Progress Updates:   Received pt supine in bed finishing breakfast, pt agreeable to therapy, and denied any pain during session. Session with emphasis on functional mobility/transfers, dressing, dynamic standing balance/coordination, ambulation, and improved activity tolerance. Pt reported that the bed at her son's house won't be ready on Sunday so the plan is to go home to her house and have her son and granddaughter take turns assisting with care until she is able to go live with her son. Pt reported someone will be with her 24/7 at her home. Pt also confirmed that she does have a RW at home. Doffed slipper socks and donned stockings with max A for time management purposes. Donned pants in supine with mod A and pt rolled to L and R with supervision and use of bedrails and required min A to pull pants over hips. Pt transferred supine<>sitting EOB with HOB elevated and use of bedrails with supervision and donned shoes max A, doffed dirty shirt with supervision, and donned clean pull over shirt with supervision and cues for anterior lean to avoid tipping posteriorly. Therapist applied detangling lotion to pt's hair and pt combed hair with set up assist and cues to lean forward. RN present to administer medications and  check vitals. Pt transferred bed<>WC stand<>pivot with RW CGA with cues for hand placement and washed face, brushed teeth, and applied lotion sitting in WC at sink with set up assist. [facility], transferred sit<>stand with close supervision/CGA, and ambulated 657fx 1 and 2059f 1 with RW and CGA/min A when turning. Pt demonstrated flexed trunk, narrow BOS, and decreased stride length and required cues for upright posture and increased step length with gait. Pt performed WC mobility [id number] bilateral UEs and supervision back to room and agreed to stay sitting in WC. Concluded session with pt sitting in WC, needs within reach, and seatbelt alarm on. Memory book updated   Therapy Documentation Precautions:  Precautions Precautions: Fall Restrictions Weight Bearing Restrictions: No  Therapy/Group: Individual Therapy AnnaAlfonse Alpers DPT   03/09/2020, 7:28 AM

## 2020-03-09 NOTE — Progress Notes (Signed)
Physical Therapy Discharge Summary  Patient Details  Name: [name] MRN: [id number] Date of Birth: Jul 03, 1941  Today's Date: 03/11/2020   Patient has met 10 of 10 long term goals due to improved activity tolerance, improved balance, improved postural control, increased strength, improved attention, improved awareness and improved coordination. Patient to discharge at an ambulatory level CGA.   Patient's son did not attend formal family education training for PT but did attend OT family education. Per OT, pt's son was educated on importance of providing CGA to pt with all mobility and transfers for safety and pt's son verbalized and demonstrated confidence with transfers.   All goals met.  Recommendation:  Patient will benefit from ongoing skilled PT services in home health setting to continue to advance safe functional mobility, address ongoing impairments in transfers, generalized strengthening, dynamic standing balance/coordination, ambulation, endurance, and to minimize fall risk.  Equipment: No equipment provided  Reasons for discharge: treatment goals met  Patient/family agrees with progress made and goals achieved: Yes  PT Discharge Precautions/Restrictions Precautions Precautions: Fall Restrictions Weight Bearing Restrictions: No Cognition Overall Cognitive Status: Within Functional Limits for tasks assessed Arousal/Alertness: Awake/alert Memory: Impaired Awareness: Appears intact Problem Solving: Appears intact Safety/Judgment: Appears intact Comments: pt requires occasional cues for safety Sensation Sensation Light Touch: Appears Intact Proprioception: Appears Intact Coordination Gross Motor Movements are Fluid and Coordinated: No Fine Motor Movements are Fluid and Coordinated: No Coordination and Movement Description: grossly uncoordinated due to LE weakness, decreased balance/postural control, and fatigue. Able to compensate well with RW. Finger Nose  Finger Test: dysmetria bilaterally Heel Shin Test: decreased ROM bilaterally Motor  Motor Motor: Abnormal postural alignment and control Motor - Skilled Clinical Observations: grossly uncoordinated due to LE weakness, decreased balance/postural control, and fatigue. Able to compensate well with RW.  Mobility Bed Mobility Bed Mobility: Rolling Right;Rolling Left;Sit to Supine;Supine to Sit Rolling Right: Independent with assistive device Rolling Left: Independent with assistive device Supine to Sit: Independent with assistive device Sit to Supine: Independent with assistive device Transfers Transfers: Sit to Stand;Stand to Sit;Stand Pivot Transfers Sit to Stand: Supervision/Verbal cueing Stand to Sit: Supervision/Verbal cueing Stand Pivot Transfers: Contact Guard/Touching assist Stand Pivot Transfer Details: Verbal cues for technique;Verbal cues for safe use of DME/AE Stand Pivot Transfer Details (indicate cue type and reason): verbal cues for RW safety Transfer (Assistive device): Rolling walker Locomotion  Gait Ambulation: Yes Gait Assistance: Contact Guard/Touching assist Gait Distance (Feet): 64 Feet Assistive device: Rolling walker Gait Gait: Yes Gait Pattern: Impaired Gait Pattern: Step-to pattern;Decreased step length - left;Decreased step length - right;Trendelenburg;Trunk flexed;Poor foot clearance - right;Poor foot clearance - left;Narrow base of support;Decreased stride length;Decreased trunk rotation Gait velocity: decreased Stairs / Additional Locomotion Stairs: Yes Stairs Assistance: Minimal Assistance - Patient > 75% Stair Management Technique: Two rails Number of Stairs: 4 Height of Stairs: 6 Ramp: Minimal Assistance - Patient >75% (RW) Product manager Mobility: Yes Wheelchair Assistance: Chartered loss adjuster: Both upper extremities Wheelchair Parts Management: Needs assistance Distance: 147f  Trunk/Postural  Assessment  Cervical Assessment Cervical Assessment: Exceptions to [facility] head) Thoracic Assessment Thoracic Assessment: Exceptions to [facility](kyphosis) Lumbar Assessment Lumbar Assessment: Exceptions to [facility](posterior pelvic tilt) Postural Control Postural Control: Deficits on evaluation  Balance Balance Balance Assessed: Yes Static Sitting Balance Static Sitting - Balance Support: Bilateral upper extremity supported;Feet supported Static Sitting - Level of Assistance: 7: Independent Dynamic Sitting Balance Dynamic Sitting - Balance Support: Bilateral upper extremity supported;Feet supported Dynamic Sitting - Level of  Assistance: 7: Independent Static Standing Balance Static Standing - Balance Support: Bilateral upper extremity supported (RW) Static Standing - Level of Assistance: 5: Stand by assistance (close supervision) Dynamic Standing Balance Dynamic Standing - Balance Support: Bilateral upper extremity supported (RW) Dynamic Standing - Level of Assistance: 5: Stand by assistance (CGA) Extremity Assessment  RLE Assessment RLE Assessment: Exceptions to [facility] Strength Comments: grossly generalized to 4-/5 (except hip flexion 3+/5) LLE Assessment LLE Assessment: Exceptions to [facility] Strength Comments: grossly generalized to 4-/5 (except hip flexion 3+/5)  Paulette Blanch PT, DPT   03/11/2020, 12:12 PM

## 2020-03-09 NOTE — Progress Notes (Signed)
Speech Language Pathology Daily Session Note  Patient Details  Name: [name] MRN: [id number] Date of Birth: [birth year]  Today's Date: 03/09/2020 SLP Individual Time: 1415-1500 SLP Individual Time Calculation (min): 45 min  Short Term Goals: Week 3: SLP Short Term Goal 1 (Week 3): STG=LTG due to ELOS  Skilled Therapeutic Interventions:  Patient seen to address dysphagia and cognitive-linguistic goals. Patient consumed graham cracker without difficulty with mastication and no oral residuals post swallow. (Patient has top dentures but they are at home and she stated she rarely wears them anway, "I gum it up"). Patient completed cognitive task of alternating attention for place color pegs in pattern presented on photo with SLP providing distraction by asking her questions about recent events and family, etc. Patient completed task without assistance or cues and without significant difficulty, saying "that was fun!". Patient was not able to recall what she did in PT session, but when SLP read to her what the PT had put in her memory book, she demonstrated recall saying "I did that!" SLP instructed patient to write down what we did in today's speech session. She was able to verbally state but not provide specific information without moderate amount of cues. SLP encouraged patient to utilize memory book, etc when at home as well.   Pain Pain Assessment Pain Scale: 0-10 Pain Score: 0-No pain  Therapy/Group: Individual Therapy  Angela Nevin, MA, CCC-SLP Speech Therapy

## 2020-03-09 NOTE — Progress Notes (Signed)
[facility] PHYSICAL MEDICINE & REHABILITATION PROGRESS NOTE  Subjective/Complaints: Patient seen sitting up in bed this morning, eating breakfast.  She states she slept well overnight.  She is aware of the change in her discharge date and is looking forward to leaving sooner.  She states her son has mentioned that if his home is not ready at the time, and her granddaughter will stay at patient's home until son's home is ready.  ROS: Denies CP, SOB, N/V/D  Objective: Vital Signs: Blood pressure (!) 146/66, pulse 50, temperature 98.2 F (36.8 C), resp. rate 16, height 5\' 2"  (1.575 m), weight 83.8 kg, SpO2 98 %. No results found. Recent Labs    03/09/20 0448  WBC 6.1  HGB 11.1*  HCT 35.2*  PLT 160   Recent Labs    03/09/20 0448  NA 142  K 3.4*  CL 109  CO2 25  GLUCOSE 84  BUN 15  CREATININE 1.03*  CALCIUM 8.8*    Physical Exam: BP (!) 146/66   Pulse 50   Temp 98.2 F (36.8 C)   Resp 16   Ht 5\' 2"  (1.575 m)   Wt 83.8 kg   SpO2 98%   BMI 33.79 kg/m   Constitutional: No distress . Vital signs reviewed. HENT: Normocephalic.  Atraumatic.  Poor dentition. Eyes: Left eye with patch.  No discharge. Cardiovascular: No JVD.  Bradycardia. Respiratory: Normal effort.  No stridor.  Bilateral clear to auscultation. GI: Non-distended.  BS +. Skin: Warm and dry.  Intact. Psych: Slightly flat. Musc: No edema in extremities.  No tenderness in extremities. Neuro: Alert. Left cranial nerve VII palsy, persistent Dysarthria, unchanged Motor: Bilateral upper extremities: 4+/5 proximal distal Left lower extremity: 4-/5 hip flexion, knee extension, 4-4+/5 ankle dorsiflexion Right lower extremity: Hip flexion, knee extension 4-4+/5, ankle dorsiflexion 4/5  Assessment/Plan: 1. 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  Physiatrist is providing  close team supervision and 24 hour management of active medical problems listed below.  Physiatrist and rehab team continue to assess barriers to discharge/monitor patient progress toward functional and medical goals  Care Tool:  Bathing  Bathing activity did not occur:  (N/A) Body parts bathed by patient: Right arm, Face, Left arm, Chest, Abdomen, Front perineal area, Buttocks, Right upper leg, Left upper leg, Right lower leg, Left lower leg   Body parts bathed by helper: Right arm, Left arm, Chest, Abdomen, Face     Bathing assist Assist Level: Minimal Assistance - Patient > 75%     Upper Body Dressing/Undressing Upper body dressing   What is the patient wearing?: Pull over shirt    Upper body assist Assist Level: Supervision/Verbal cueing    Lower Body Dressing/Undressing Lower body dressing      What is the patient wearing?: Incontinence brief, Pants     Lower body assist Assist for lower body dressing: Minimal Assistance - Patient > 75%     Toileting Toileting Toileting Activity did not occur (Clothing management and hygiene only): N/A (no void or bm)  Toileting assist Assist for toileting: Moderate Assistance - Patient 50 - 74%     Transfers Chair/bed transfer  Transfers assist     Chair/bed transfer assist level: Minimal Assistance - Patient > 75%     Locomotion Ambulation   Ambulation assist   Ambulation activity did not occur: Safety/medical concerns (decreased strength/activity tolerance)  Assist level: Minimal Assistance -  Patient > 75% Assistive device: Walker-rolling Max distance: 69ft   Walk 10 feet activity   Assist  Walk 10 feet activity did not occur: Safety/medical concerns (decreased strength activity tolerance)  Assist level: Minimal Assistance - Patient > 75% Assistive device: Walker-rolling   Walk 50 feet activity   Assist Walk 50 feet with 2 turns activity did not occur: Safety/medical concerns (decreased strength activity  tolerance)  Assist level: Minimal Assistance - Patient > 75% Assistive device: Walker-rolling    Walk 150 feet activity   Assist Walk 150 feet activity did not occur: Safety/medical concerns         Walk 10 feet on uneven surface  activity   Assist Walk 10 feet on uneven surfaces activity did not occur: Safety/medical concerns   Assist level: Minimal Assistance - Patient > 75% Assistive device: Photographer Will patient use wheelchair at discharge?: Yes Type of Wheelchair: Manual    Wheelchair assist level: Supervision/Verbal cueing Max wheelchair distance: >124ft    Wheelchair 50 feet with 2 turns activity    Assist        Assist Level: Supervision/Verbal cueing   Wheelchair 150 feet activity     Assist     Assist Level: Supervision/Verbal cueing      Medical Problem List and Plan: 1.  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  Status post TPA.  Status post loop recorder placement  Continue CIR  Continue ophthalmic solution/ointments, continue patch. 2.  Antithrombotics: -DVT/anticoagulation: Lovenox.  Vascular study negative             -antiplatelet therapy: Aspirin 81 mg daily and Plavix 85 mg daily x3 weeks then Plavix alone 3. Pain Management: Oxycodone 15 mg every 4 hours as needed chronic pain  Controlled with meds on 7/29  Monitor with increased exertion 4. Mood: Melatonin 3 mg nightly, Remeron 15 mg nightly as needed,  BuSpar 30 mg twice daily             -antipsychotic agents: N/A 5. Neuropsych: This patient is ?fully capable of making decisions on her own behalf. 6. Skin/Wound Care: Routine skin checks 7. Fluids/Electrolytes/Nutrition: Routine in and outs  Reordered daily multivitamin she takes at home 8.  Hypertension.  Monitor with increased mobility  Lisinopril 10, amlodipine 10 PTA   Lisinopril 5 started  on 7/12, decreased to 2.5 on 7/17, increased to 5 again on 7/28  Relatively controlled on 7/29 9.  Hyperlipidemia.    Continue Lipitor 10.  Obesity.  BMI 35.56.  Follow-up dietary 11.  OSA.  Patient on CPAP at home. 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  Follow-up outpatient cardiology service 13. Dementia: Namenda 28 mg daily, Aricept 10 mg nightly. 14.  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  Mild hypoalbuminemia  Supplement initiated on 7/13  16.  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 Sleep disturbance  See #3  Improving overall 19. Bradycardia  Discussed with pharmacy, no offending agents  ECG reviewed, showing bradycardia with first-degree AV block with ?subacute infarct. No complaints of pain  Per discussion with cardiology, no changes at present per cardiology, follow-up as outpatient.  Stable on 7/29 20.  Hypokalemia  Potassium 3.4 on 7/29  Supplemented x1 day on 7/26, again on 7/29 21.  Macrocytic anemia  Hemoglobin 11.1 on 7/29  Vitamin B12/folate ordered   LOS: 17 days A FACE TO FACE EVALUATION WAS PERFORMED  Vanisha Whiten [name] 03/09/2020, 8:46 AM

## 2020-03-09 NOTE — Progress Notes (Signed)
Patient ID: [name], female   DOB: May 17, 1941, 79 y.o.   MRN: [id number]  SW received phone call from pt son [name] [phone]) who was calling to follow-up about pt d/c location. He reported pt will d/c to their home, and he will get a bed for pt if the hospital bed is not needed. SW informed w/c and RW not needed, and SW will confirm about hospital bed.  *SW followed up with pt son to inform hospital bed not needed. SW reminded son [employer] will follow-up about DME ordered.   Cecile Sheerer, MSW, LCSWA Office: [phone] Cell: [phone] Fax: [phone]

## 2020-03-10 ENCOUNTER — Inpatient Hospital Stay (HOSPITAL_COMMUNITY): Payer: Medicare Other

## 2020-03-10 ENCOUNTER — Inpatient Hospital Stay (HOSPITAL_COMMUNITY): Payer: Medicare Other | Admitting: Occupational Therapy

## 2020-03-10 MED ORDER — ARTIFICIAL TEARS OPHTHALMIC OINT: TOPICAL_OINTMENT | Freq: Every day | OPHTHALMIC | 1 refills | Status: DC

## 2020-03-10 NOTE — Progress Notes (Signed)
Occupational Therapy Discharge Summary  Patient Details  Name: [name] MRN: [id number] Date of Birth: 1941/02/24  Today's Date: 03/10/2020  Patient has met 11 of 11 long term goals due to improved activity tolerance, improved balance, postural control, ability to compensate for deficits and improved awareness.  Patient to discharge at overall CGA- Min Assist level.  Patient's care partner is independent to provide the necessary physical and cognitive assistance at discharge. Pt to discharge home with son. Son present for family education on 7/30. Educated son on safety considerations for mobility and bathing. Son completed functional transfers and sit <> stands, and demonstrated proficiency providing physical assistance and cognitive cueing.    Reasons goals not met: n/a  Recommendation:  Patient will benefit from ongoing skilled OT services in home health setting to continue to advance functional skills in the area of BADL.  Equipment: 3 in 1 BSC, TTB  Reasons for discharge: treatment goals met and discharge from hospital  Patient/family agrees with progress made and goals achieved: Yes  OT Discharge Precautions/Restrictions  Precautions Precautions: Fall Restrictions Weight Bearing Restrictions: No General Vital Signs Therapy Vitals Temp: 98.3 F (36.8 C) Pulse Rate: (!) 41 Resp: 16 BP: (!) 119/44 Patient Position (if appropriate): Sitting Oxygen Therapy SpO2: 97 % O2 Device: Room Air Pain Pain Assessment Pain Scale: 0-10 Pain Score: 0-No pain ADL Grooming: Supervision/safety Where Assessed-Grooming: Sitting at sink Upper Body Bathing: Supervision/safety Where Assessed-Upper Body Bathing: Shower Lower Body Bathing: Contact guard Where Assessed-Lower Body Bathing: Shower Upper Body Dressing: Supervision/safety Where Assessed-Upper Body Dressing: Sitting at sink Lower Body Dressing: Minimal assistance Where Assessed-Lower Body Dressing:  Wheelchair Toileting: Contact guard Where Assessed-Toileting: Glass blower/designer: Therapist, music Method: Arts development officer: Tax adviser Method: Librarian, academic: Facilities manager: Curator Method: Radiographer, therapeutic: Grab bars, Gaffer Baseline Vision/History: Wears glasses Wears Glasses: Reading only Patient Visual Report: No change from baseline Vision Assessment?: Yes Eye Alignment: Impaired (comment) Ocular Range of Motion: Other (comment) (exceptions to [facility] with R eye, Lt eye with eye patch so unable to assess) Alignment/Gaze Preference: Gaze right Perception  Perception: Impaired Comments: Lt inattention Praxis Praxis: Impaired Praxis Impairment Details: Motor planning Cognition Overall Cognitive Status: Within Functional Limits for tasks assessed Arousal/Alertness: Awake/alert Orientation Level: Oriented X4 Attention: Focused;Sustained Focused Attention: Appears intact Memory: Impaired Memory Impairment: Retrieval deficit;Decreased short term memory Awareness: Appears intact Reasoning: Impaired Sequencing: Impaired Safety/Judgment: Appears intact Comments: pt requires occasional cues for safety Sensation Sensation Light Touch: Appears Intact Proprioception: Appears Intact Coordination Gross Motor Movements are Fluid and Coordinated: No Fine Motor Movements are Fluid and Coordinated: No Coordination and Movement Description: grossly uncoordinated due to LE weakness, decreased balance/postural control, and fatigue. Able to compensate well with RW. Finger Nose Finger Test: dysmetria bilaterally, Rt> Lt Motor  Motor Motor: Abnormal postural alignment and control Motor - Skilled Clinical Observations: grossly uncoordinated due to LE weakness, decreased balance/postural  control, and fatigue. Able to compensate well with RW. Mobility  Bed Mobility Bed Mobility: Rolling Right;Rolling Left;Sit to Supine;Supine to Sit Rolling Right: Independent with assistive device Rolling Left: Independent with assistive device Supine to Sit: Independent with assistive device Sit to Supine: Independent with assistive device Transfers Sit to Stand: Supervision/Verbal cueing Stand to Sit: Supervision/Verbal cueing  Trunk/Postural Assessment  Postural Control Postural Control: Deficits on evaluation  Balance Dynamic Sitting Balance Dynamic Sitting -  Balance Activities: Forward lean/weight shifting Sitting balance - Comments: relaint on support from BUE Extremity/Trunk Assessment RUE Assessment RUE Assessment: Exceptions to [facility] Active Range of Motion (AROM) Comments: WFL General Strength Comments: grossly 4/5 LUE Assessment LUE Assessment: Exceptions to [facility] Active Range of Motion (AROM) Comments: [facility] General Strength Comments: grossly 4/5   Sarah Rhymer 03/10/2020, 3:17 PM

## 2020-03-10 NOTE — Progress Notes (Addendum)
Physical Therapy Session Note  Patient Details  Name: [name] MRN: [id number] Date of Birth: [birth year]  Today's Date: 03/10/2020 PT Individual Time: 0800-0854 PT Individual Time Calculation (min): 54 min   Short Term Goals: Week 2:  PT Short Term Goal 1 (Week 2): Pt will perform bed mobility with min A PT Short Term Goal 1 - Progress (Week 2): Met PT Short Term Goal 2 (Week 2): Pt will transfer bed<>chair with LRAD min A PT Short Term Goal 2 - Progress (Week 2): Met PT Short Term Goal 3 (Week 2): Pt will ambulate 55f with LRAD min A PT Short Term Goal 3 - Progress (Week 2): Met Week 3:  PT Short Term Goal 1 (Week 3): STG=LTG due to LOS  Skilled Therapeutic Interventions/Progress Updates:   Received pt supine in bed, pt agreeable to therapy, and denied any pain during session. Session with emphasis on discharge planning, functional mobility/transfers, generalized strengthening, dynamic standing balance/coordination, ambulation, stair navigation, and improved activity tolerance. Therapist educated pt on importance of making sure someone has their hands on her whenever she is up and moving at home; pt verbalized understanding. Doffed slipper socks and donned stockings max A for time management purposes. Pt transferred supine<>sitting EOB with supervision and donned shoes max A for time management purposes. Pt transferred bed<>WC stand<>pivot with RW and CGA. Therapist applied detangling lotion to pt's hair and pt combed hair, washed face, and brushed teeth sitting in WC at sink with set up assist. [facility] for time management purposes and ambulated 129fon uneven surfaces (ramp) with RW and min A. Pt required cues for eccentric control of RW when descending ramp and to keep RW close to her. Pt performed ambulatory car transfer with RW and CGA and transferred sit<>stand with RW and close supervision and picked up small cup from floor with RW and min A for  balance. [facility] and navigated 4 steps with 2 rails min A ascending with a step through pattern and descending with a step to pattern. Pt required cues for stepping sequence and foot placement on step. Pt performed 5x sit<>stand with RW and CGA in 44 seconds. Pt educated on test results and significance and verbalized understanding. Pt fatigued at end of session and [facility]. Concluded session with pt sitting in WC, needs within reach, and seatbelt alarm on. Memory notebook updated.   Therapy Documentation Precautions:  Precautions Precautions: Fall Restrictions Weight Bearing Restrictions: No  Therapy/Group: Individual Therapy [name], DPT   03/10/2020, 7:54 AM

## 2020-03-10 NOTE — Progress Notes (Signed)
Occupational Therapy Session Note  Patient Details  Name: [name] MRN: [id number] Date of Birth: 29-Sep-1940  Today's Date: 03/10/2020 OT Individual Time: 1000-1058 OT Individual Time Calculation (min): 58 min    Short Term Goals: Week 2:  OT Short Term Goal 1 (Week 2): Pt will don pants with minA to demonstrsate increase dynamic standing balance. OT Short Term Goal 1 - Progress (Week 2): Met OT Short Term Goal 2 (Week 2): Pt will attend to L visual field during functional mobility with min cues. OT Short Term Goal 2 - Progress (Week 2): Progressing toward goal OT Short Term Goal 3 (Week 2): Pt will complete functional transfers with minA for 2 consecutive sessions. OT Short Term Goal 3 - Progress (Week 2): Met OT Short Term Goal 4 (Week 2): Pt will don shirt with minA and moderate cognitive cues. OT Short Term Goal 4 - Progress (Week 2): Met Week 3:  OT Short Term Goal 1 (Week 3): STG = LTGs due to ELOS  Skilled Therapeutic Interventions/Progress Updates:    Treatment session with focus on family education and functional transfers. Pt received seated in w/c with son and RN present. Son with questions about TTB, educated on purpose of and use of TTB. MD present, discussing eye patch care and medication with son. Transported to rehab apartment. Demonstrated tub transfers and educated on safety considerations. Completed ambulatory tub transfers with CGA and RW, providing cues to pt for sequencing. Educated son on body positioning and cueing for balance and upright posture. Son completed ambulating tub transfers with CGA and RW, providing sequencing and pacing cues to pt. Pt ambulated from toilet > w/c with CGA and RW. Son ambulating with pt to and from toilet with CGA and with RW. Simulated LB dressing at sit <> stand level at sink with CGA. Son engaged in sit <> stands at sink and dynamic standing with pt. Educated pt on importance of at least CGA at all times to reduce fall risk, with  son demonstrating proficiency. Ended session with pt seated in w/c with all needs within reach and seat alarm on.  Therapy Documentation Precautions:  Precautions Precautions: Fall Restrictions Weight Bearing Restrictions: No General:   Vital Signs: Therapy Vitals Temp: 97.8 F (36.6 C) Temp Source: Oral Pulse Rate: (!) 41 Resp: 16 BP: (!) 151/61 Patient Position (if appropriate): Lying Oxygen Therapy SpO2: 97 % O2 Device: Room Air Pain: Pain Assessment Pain Scale: 0-10 Pain Score: 0-No pain  Therapy/Group: Individual Therapy  Michelle Nasuti 03/10/2020, 7:12 AM

## 2020-03-10 NOTE — Progress Notes (Signed)
Speech Language Pathology Daily Session Note  Patient Details  Name: [name] MRN: [id number] Date of Birth: [birth year]  Today's Date: 03/10/2020 SLP Individual Time: 1430-1459 SLP Individual Time Calculation (min): 29 min  Short Term Goals: Week 3: SLP Short Term Goal 1 (Week 3): STG=LTG due to ELOS  Skilled Therapeutic Interventions:   Skilled SLP tx focused on cognition. Pt completed card sorting problem solving task with 70% accuracy and Mod A verbal cues. Demonstrated difficulty with reasoning best cards to place in discard piles initially however, increased accuracy and speed once familiar with task. Pt able to recall 3 tasks completed in previous sessions using memory book as a reference x1. Pt assisted with stand pivot transfer to bed using walker with min A. Pt left in bed with call bell and all needs within reach. Bed alarm was on. Cont therapy per plan of care.   Pain Pain Assessment Pain Scale: 0-10 Pain Score: 0-No pain  Therapy/Group: Individual Therapy  [name] 03/10/2020, 3:50 PM

## 2020-03-10 NOTE — Progress Notes (Signed)
Occupational Therapy Discharge Summary  Patient Details  Name: [name] MRN: [id number] Date of Birth: October 05, 1940  Today's Date: 03/10/2020  Patient has met 11 of 11 long term goals due to improved activity tolerance, improved balance, postural control, ability to compensate for deficits and improved awareness.  Patient to discharge at overall CGA- Min Assist level.  Patient's care partner is independent to provide the necessary physical and cognitive assistance at discharge. Pt to discharge home with son. Son present for family education on 7/30. Educated son on safety considerations for mobility and bathing. Son completed functional transfers and sit <> stands, and demonstrated proficiency providing physical assistance and cognitive cueing.    Reasons goals not met: n/a  Recommendation:  Patient will benefit from ongoing skilled OT services in home health setting to continue to advance functional skills in the area of BADL.  Equipment: 3 in 1 BSC, TTB  Reasons for discharge: treatment goals met and discharge from hospital  Patient/family agrees with progress made and goals achieved: Yes  OT Discharge Precautions/Restrictions  Precautions Precautions: Fall Restrictions Weight Bearing Restrictions: No General   Vital Signs Therapy Vitals Temp: 98.3 F (36.8 C) Pulse Rate: (!) 41 Resp: 16 BP: (!) 119/44 Patient Position (if appropriate): Sitting Oxygen Therapy SpO2: 97 % O2 Device: Room Air Pain Pain Assessment Pain Scale: 0-10 Pain Score: 0-No pain ADL ADL Grooming: Moderate assistance Where Assessed-Grooming: Sitting at sink Upper Body Bathing: Moderate assistance Where Assessed-Upper Body Bathing: Sitting at sink Lower Body Bathing: Maximal assistance Where Assessed-Lower Body Bathing: Sitting at sink Upper Body Dressing: Moderate assistance Where Assessed-Upper Body Dressing: Sitting at sink Lower Body Dressing: Dependent Where Assessed-Lower Body  Dressing: Other (Comment) (stedy) Toilet Transfer: Dependent (stedy) Toilet Transfer Method: Other (comment) (stedy) Vision Baseline Vision/History: Wears glasses Wears Glasses: Reading only Patient Visual Report: No change from baseline Vision Assessment?: Yes Eye Alignment: Impaired (comment) Ocular Range of Motion: Other (comment) (exceptions to [facility] with R eye, Lt eye with eye patch so unable to assess) Alignment/Gaze Preference: Gaze right Perception  Perception: Impaired Comments: Lt inattention Praxis Praxis: Impaired Praxis Impairment Details: Motor planning Cognition Overall Cognitive Status: Within Functional Limits for tasks assessed Arousal/Alertness: Awake/alert Orientation Level: Oriented X4 Attention: Focused;Sustained Focused Attention: Appears intact Memory: Impaired Memory Impairment: Retrieval deficit;Decreased short term memory Awareness: Appears intact Reasoning: Impaired Sequencing: Impaired Safety/Judgment: Appears intact Comments: pt requires occasional cues for safety Sensation Sensation Light Touch: Appears Intact Proprioception: Appears Intact Coordination Gross Motor Movements are Fluid and Coordinated: No Fine Motor Movements are Fluid and Coordinated: No Coordination and Movement Description: grossly uncoordinated due to LE weakness, decreased balance/postural control, and fatigue. Able to compensate well with RW. Finger Nose Finger Test: dysmetria bilaterally, Rt> Lt Motor  Motor Motor: Abnormal postural alignment and control Motor - Skilled Clinical Observations: grossly uncoordinated due to LE weakness, decreased balance/postural control, and fatigue. Able to compensate well with RW. Mobility  Bed Mobility Bed Mobility: Rolling Right;Rolling Left;Sit to Supine;Supine to Sit Rolling Right: Independent with assistive device Rolling Left: Independent with assistive device Supine to Sit: Independent with assistive device Sit to Supine:  Independent with assistive device Transfers Sit to Stand: Supervision/Verbal cueing Stand to Sit: Supervision/Verbal cueing  Trunk/Postural Assessment  Postural Control Postural Control: Deficits on evaluation  Balance Dynamic Sitting Balance Dynamic Sitting - Balance Activities: Forward lean/weight shifting Sitting balance - Comments: relaint on support from BUE Extremity/Trunk Assessment RUE Assessment RUE Assessment: Exceptions to [name] Active Range of Motion (AROM) Comments: [facility]  Strength Comments: grossly 4/5 LUE Assessment LUE Assessment: Exceptions to [facility] Active Range of Motion (AROM) Comments: [facility] Strength Comments: grossly 4/5   Nayzeth Altman 03/10/2020, 3:17 PM

## 2020-03-10 NOTE — Discharge Instructions (Signed)
Inpatient Rehab Discharge Instructions  [name] Discharge date and time: No discharge date for patient encounter.   Activities/Precautions/ Functional Status: Activity: activity as tolerated Diet:  Wound Care: none needed Functional status:  ___ No restrictions     ___ Walk up steps independently ___ 24/7 supervision/assistance   ___ Walk up steps with assistance ___ Intermittent supervision/assistance  ___ Bathe/dress independently ___ Walk with walker     _x__ Bathe/dress with assistance ___ Walk Independently    ___ Shower independently ___ Walk with assistance    ___ Shower with assistance ___ No alcohol     ___ Return to work/school ________   COMMUNITY REFERRALS UPON DISCHARGE:    Home Health:   PT     OT     ST     SNA                  Agency: [facility] Phone: [phone]  Medical Equipment/Items Ordered: 3in1 bedside commode and tub transfer bench?                                                 Agency/Supplier: [employer] [phone]    Special Instructions: No driving smoking or alcohol  Lacri-Lube ointment applied to left eye at bedtime with patch  Continue aspirin and Plavix x3 weeks then Plavix alone STROKE/TIA DISCHARGE INSTRUCTIONS SMOKING Cigarette smoking nearly doubles your risk of having a stroke & is the single most alterable risk factor  If you smoke or have smoked in the last 12 months, you are advised to quit smoking for your health.  Most of the excess cardiovascular risk related to smoking disappears within a year of stopping.  Ask you doctor about anti-smoking medications   Quit Line: 1-800-QUIT NOW  Free Smoking Cessation Classes [phone]  CHOLESTEROL Know your levels; limit fat & cholesterol in your diet  Lipid Panel     Component Value Date/Time   CHOL 130 02/17/2020 0350   TRIG 51 02/17/2020 0350   HDL 50 02/17/2020 0350   CHOLHDL 2.6 02/17/2020 0350   VLDL 10 02/17/2020 0350    LDLCALC 70 02/17/2020 0350      Many patients benefit from treatment even if their cholesterol is at goal.  Goal: Total Cholesterol (CHOL) less than 160  Goal:  Triglycerides (TRIG) less than 150  Goal:  HDL greater than 40  Goal:  LDL (LDLCALC) less than 100   BLOOD PRESSURE American Stroke Association blood pressure target is less that 120/80 mm/Hg  Your discharge blood pressure is:  BP: (!) 98/51  Monitor your blood pressure  Limit your salt and alcohol intake  Many individuals will require more than one medication for high blood pressure  DIABETES (A1c is a blood sugar average for last 3 months) Goal HGBA1c is under 7% (HBGA1c is blood sugar average for last 3 months)  Diabetes: No known diagnosis of diabetes    Lab Results  Component Value Date   HGBA1C 5.5 02/17/2020     Your HGBA1c can be lowered with medications, healthy diet, and exercise.  Check your blood sugar as directed by your physician  Call your physician if you experience unexplained or low blood sugars.  PHYSICAL ACTIVITY/REHABILITATION Goal is 30 minutes at least 4 days per week  Activity: Increase activity slowly, Therapies: Physical Therapy: Home Health  Return to work:   Activity decreases your risk of heart attack and stroke and makes your heart stronger.  It helps control your weight and blood pressure; helps you relax and can improve your mood.  Participate in a regular exercise program.  Talk with your doctor about the best form of exercise for you (dancing, walking, swimming, cycling).  DIET/WEIGHT Goal is to maintain a healthy weight  Your discharge diet is:  Diet Order            DIET DYS 2 Room service appropriate? Yes with Assist; Fluid consistency: Thin  Diet effective now                 liquids Your height is:  Height: 5\' 2"  (157.5 cm) Your current weight is: Weight: 83.8 kg Your Body Mass Index (BMI) is:  BMI (Calculated): 33.78  Following the type of diet specifically  designed for you will help prevent another stroke.  Your goal weight range is:    Your goal Body Mass Index (BMI) is 19-24.  Healthy food habits can help reduce 3 risk factors for stroke:  High cholesterol, hypertension, and excess weight.  RESOURCES Stroke/Support Group:  Call [phone]   STROKE EDUCATION PROVIDED/REVIEWED AND GIVEN TO PATIENT Stroke warning signs and symptoms How to activate emergency medical system (call 911). Medications prescribed at discharge. Need for follow-up after discharge. Personal risk factors for stroke. Pneumonia vaccine given:  Flu vaccine given:  My questions have been answered, the writing is legible, and I understand these instructions.  I will adhere to these goals & educational materials that have been provided to me after my discharge from the hospital.      My questions have been answered and I understand these instructions. I will adhere to these goals and the provided educational materials after my discharge from the hospital.  Patient/Caregiver Signature _______________________________ Date __________  Clinician Signature _______________________________________ Date __________  Please bring this form and your medication list with you to all your follow-up doctor's appointments.

## 2020-03-10 NOTE — Progress Notes (Addendum)
[name] PHYSICAL MEDICINE & REHABILITATION PROGRESS NOTE  Subjective/Complaints:  Pt reports has chronic back pain- 6/10- just received her oxycodone a few minutes ago-  LBM today- urinating OK.  Denies eye pain today- son is asking when to have pt wear eye patch- per OT, pt hasn't been rotating eye patch because putting eye ointment in L eye and then using patch to keep ointment in place.   K+ was repleted yesterday  ROS:  Pt denies SOB, abd pain, CP, N/V/C/D, and vision changes   Objective: Vital Signs: Blood pressure (!) 151/61, pulse (!) 41, temperature 97.8 F (36.6 C), temperature source Oral, resp. rate 16, height 5\' 2"  (1.575 m), weight 83.8 kg, SpO2 97 %. No results found. Recent Labs    03/09/20 0448  WBC 6.1  HGB 11.1*  HCT 35.2*  PLT 160   Recent Labs    03/09/20 0448  NA 142  K 3.4*  CL 109  CO2 25  GLUCOSE 84  BUN 15  CREATININE 1.03*  CALCIUM 8.8*    Physical Exam: BP (!) 151/61 (BP Location: Left Arm)   Pulse (!) 41   Temp 97.8 F (36.6 C) (Oral)   Resp 16   Ht 5\' 2"  (1.575 m)   Wt 83.8 kg   SpO2 97%   BMI 33.79 kg/m   Constitutional: No distress . Vital signs reviewed. Up in manual w/c with OT in room, son also at bedside, NAD HENT: Normocephalic.  Atraumatic.  Poor dentition. Eyes: Left eye with patch. Gauze underneath patch- not being switched eye to eye per OT,   No discharge. Cardiovascular: bradycardia- regular rhythm Respiratory: CTA B/L- no W/R/R- good air movement GI: Soft, NT, ND, (+)BS  Skin: Warm and dry.  Intact. Psych: Slightly flat. No change- is answering questions Musc: No edema in extremities.  No tenderness in extremities. Neuro: Alert. Left cranial nerve VII palsy, persistent Dysarthria, unchanged Motor: Bilateral upper extremities: 4+/5 proximal distal Left lower extremity: 4-/5 hip flexion, knee extension, 4-4+/5 ankle dorsiflexion Right lower extremity: Hip flexion, knee extension 4-4+/5, ankle dorsiflexion  4/5  Assessment/Plan: 1. 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  Physiatrist is providing close team supervision and 24 hour management of active medical problems listed below.  Physiatrist and rehab team continue to assess barriers to discharge/monitor patient progress toward functional and medical goals  Care Tool:  Bathing  Bathing activity did not occur:  (N/A) Body parts bathed by patient: Right arm, Face, Left arm, Chest, Abdomen, Front perineal area, Buttocks, Right upper leg, Left upper leg, Right lower leg, Left lower leg   Body parts bathed by helper: Right arm, Left arm, Chest, Abdomen, Face     Bathing assist Assist Level: Minimal Assistance - Patient > 75%     Upper Body Dressing/Undressing Upper body dressing   What is the patient wearing?: Pull over shirt    Upper body assist Assist Level: Supervision/Verbal cueing    Lower Body Dressing/Undressing Lower body dressing      What is the patient wearing?: Incontinence brief, Pants     Lower body assist Assist for lower body dressing: Minimal Assistance - Patient > 75%     Toileting Toileting Toileting Activity did not occur (Clothing management and hygiene only): N/A (no void or bm)  Toileting assist Assist for toileting: Moderate Assistance - Patient 50 - 74%     Transfers Chair/bed  transfer  Transfers assist     Chair/bed transfer assist level: Contact Guard/Touching assist     Locomotion Ambulation   Ambulation assist   Ambulation activity did not occur: Safety/medical concerns (decreased strength/activity tolerance)  Assist level: Contact Guard/Touching assist Assistive device: Walker-rolling Max distance: 95ft   Walk 10 feet activity   Assist  Walk 10 feet activity did not occur: Safety/medical concerns (decreased strength activity  tolerance)  Assist level: Contact Guard/Touching assist Assistive device: Walker-rolling   Walk 50 feet activity   Assist Walk 50 feet with 2 turns activity did not occur: Safety/medical concerns (decreased strength activity tolerance)  Assist level: Contact Guard/Touching assist Assistive device: Walker-rolling    Walk 150 feet activity   Assist Walk 150 feet activity did not occur: Safety/medical concerns (fatigue, decreased endurance, LE weakness)         Walk 10 feet on uneven surface  activity   Assist Walk 10 feet on uneven surfaces activity did not occur: Safety/medical concerns   Assist level: Minimal Assistance - Patient > 75% Assistive device: Photographer Will patient use wheelchair at discharge?: No Type of Wheelchair: Manual    Wheelchair assist level: Supervision/Verbal cueing Max wheelchair distance: >176ft    Wheelchair 50 feet with 2 turns activity    Assist        Assist Level: Supervision/Verbal cueing   Wheelchair 150 feet activity     Assist     Assist Level: Supervision/Verbal cueing      Medical Problem List and Plan: 1.  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  Status post TPA.  Status post loop recorder placement  Continue CIR  Continue ophthalmic solution/ointments, continue patch.  7/30- will see if can alternate patch, like normal 2.  Antithrombotics: -DVT/anticoagulation: Lovenox.  Vascular study negative             -antiplatelet therapy: Aspirin 81 mg daily and Plavix 85 mg daily x3 weeks then Plavix alone 3. Pain Management: Oxycodone 15 mg every 4 hours as needed chronic pain  Controlled with meds on 7/29  7/30- controlled with home doses of oxycodone- per son, will need for life current meds  Monitor with increased exertion 4. Mood: Melatonin 3 mg nightly, Remeron 15 mg nightly as  needed,  BuSpar 30 mg twice daily             -antipsychotic agents: N/A 5. Neuropsych: This patient is ?fully capable of making decisions on her own behalf. 6. Skin/Wound Care: Routine skin checks 7. Fluids/Electrolytes/Nutrition: Routine in and outs  Reordered daily multivitamin she takes at home 8.  Hypertension.  Monitor with increased mobility  Lisinopril 10, amlodipine 10 PTA   Lisinopril 5 started on 7/12, decreased to 2.5 on 7/17, increased to 5 again on 7/28  Relatively controlled on 7/29 9.  Hyperlipidemia.    Continue Lipitor 10.  Obesity.  BMI 35.56.  Follow-up dietary 11.  OSA.  Patient on CPAP at home. 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  Follow-up outpatient cardiology service 13. Dementia: Namenda 28 mg daily, Aricept 10 mg nightly. 14.  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  Mild hypoalbuminemia  Supplement initiated on 7/13  16.  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 Sleep disturbance  See #  3  Improving overall 19. Bradycardia  Discussed with pharmacy, no offending agents  ECG reviewed, showing bradycardia with first-degree AV block with ?subacute infarct. No complaints of pain  Per discussion with cardiology, no changes at present per cardiology, follow-up as outpatient.  7/30- HR low to mid 40s- per Cards, will f/u outpatient 20.  Hypokalemia  Potassium 3.4 on 7/29  Supplemented x1 day on 7/26, again on 7/29  7/30- repleted 7/29- will see if can be rechecked outpatient by PCP in next 1-2 weeks. 21.  Macrocytic anemia  Hemoglobin 11.1 on 7/29  Vitamin B12/folate ordered   LOS: 18 days A FACE TO FACE EVALUATION WAS PERFORMED  [name] 03/10/2020, 12:55 PM

## 2020-03-11 ENCOUNTER — Encounter (HOSPITAL_COMMUNITY): Payer: Medicare Other | Admitting: Speech Pathology

## 2020-03-11 ENCOUNTER — Ambulatory Visit (HOSPITAL_COMMUNITY): Payer: Medicare Other | Admitting: Physical Therapy

## 2020-03-11 ENCOUNTER — Encounter (HOSPITAL_COMMUNITY): Payer: Medicare Other | Admitting: Occupational Therapy

## 2020-03-11 NOTE — Progress Notes (Signed)
Occupational Therapy Session Note  Patient Details  Name: [name] MRN: [id number] Date of Birth: [birth year]  Today's Date: 03/11/2020 OT Individual Time: 0800-0900 OT Individual Time Calculation (min): 60 min    Short Term Goals: Week 1:  OT Short Term Goal 1 (Week 1): Pt will transfer to [facility] with maxA of one person with LRAD. OT Short Term Goal 1 - Progress (Week 1): Met OT Short Term Goal 2 (Week 1): Pt will don shirt with minA and moderate cognitive cues. OT Short Term Goal 2 - Progress (Week 1): Progressing toward goal OT Short Term Goal 3 (Week 1): Pt will dress LB with maxA with LRAD to demonstrated improved dynamic standing balance. OT Short Term Goal 3 - Progress (Week 1): Met OT Short Term Goal 4 (Week 1): Pt will attend to items located on Lt side during grooming tasks with moderate cues. OT Short Term Goal 4 - Progress (Week 1): Met Week 2:  OT Short Term Goal 1 (Week 2): Pt will don pants with minA to demonstrsate increase dynamic standing balance. OT Short Term Goal 1 - Progress (Week 2): Met OT Short Term Goal 2 (Week 2): Pt will attend to L visual field during functional mobility with min cues. OT Short Term Goal 2 - Progress (Week 2): Progressing toward goal OT Short Term Goal 3 (Week 2): Pt will complete functional transfers with minA for 2 consecutive sessions. OT Short Term Goal 3 - Progress (Week 2): Met OT Short Term Goal 4 (Week 2): Pt will don shirt with minA and moderate cognitive cues. OT Short Term Goal 4 - Progress (Week 2): Met Week 3:  OT Short Term Goal 1 (Week 3): STG = LTGs due to ELOS   Skilled Therapeutic Interventions/Progress Updates:      Pt seen for BADL retraining of toileting, bathing, and dressing with a focus on safe balance and mobility.  Pt did extremely well completing all self care in a timely manner at a CGA/min A level for LB and setup / S with UB.  Her son was here for family education yesterday. Her granddaughter was not here  this session but pt did well explaining to me what A she needs and will do well with other family members.  At end of session, pt resting in wc with all needs met.   Therapy Documentation Precautions:  Precautions Precautions: Fall Restrictions Weight Bearing Restrictions: No   Pain:   ADL: ADL Grooming: Supervision/safety Where Assessed-Grooming: Sitting at sink Upper Body Bathing: Supervision/safety Where Assessed-Upper Body Bathing: Shower Lower Body Bathing: Contact guard Where Assessed-Lower Body Bathing: Shower Upper Body Dressing: Supervision/safety Where Assessed-Upper Body Dressing: Sitting at sink Lower Body Dressing: Minimal assistance Where Assessed-Lower Body Dressing: Wheelchair Toileting: Contact guard Where Assessed-Toileting: Glass blower/designer: Therapist, music Method: Arts development officer: Energy manager: Metallurgist Method: Librarian, academic: Facilities manager: Curator Method: Radiographer, therapeutic: Grab bars, Transfer tub bench   Therapy/Group: Individual Therapy  Dalton 03/11/2020, 1:49 PM

## 2020-03-11 NOTE — Progress Notes (Signed)
Physical Therapy Session Note  Patient Details  Name: [name] MRN: [id number] Date of Birth: [birth year]  Today's Date: 03/11/2020 PT Individual Time: 8144-8185 PT Individual Time Calculation (min): 54 min   Short Term Goals: Week 3:  PT Short Term Goal 1 (Week 3): STG=LTG due to LOS  Skilled Therapeutic Interventions/Progress Updates:  Pt received in bed & agreeable to tx. No family present in room. Pt completes supine>sit with supervision, extra time & hospital bed features. Pt attempts to don shoes but has difficulty maintaining midline sitting balance, although pt is aware of this & attempts to correct, so PT dons shoes max assist for time management. Sit<>stand from EOB & w/c throughout session with CGA & stand pivot bed>w/c with RW & CGA. Transported pt around unit via w/c dependent assist for time management. Pt completes ambulatory car transfer at sedan simulated height with RW & min assist for sit>stand from low car seat. Pt ambulates 60 ft with RW & CGA with pt reporting she feels "shaky" and "unabalanced on right leg" so rest break provided. Pt demonstrates forward trunk flexion during standing & gait with RW. Pt performed standing cone taps with BUE support on RW & min assist with focus on weight shifting L<>R & BLE coordination & NMR with seated rest breaks between trials 2/2 fatigue. Pt propels w/c back to room with BUE & supervision with extra time. PT completed memory notebook entry & pt requires min cuing to recall activities completed during session. Pt left in w/c with chair alarm donned, call bell in reach.  Therapy Documentation Precautions:  Precautions Precautions: Fall Restrictions Weight Bearing Restrictions: No  Pain: Pt denies c/o pain   Therapy/Group: Individual Therapy  Sandi Mariscal 03/11/2020, 12:08 PM

## 2020-03-11 NOTE — Progress Notes (Signed)
[place] PHYSICAL MEDICINE & REHABILITATION PROGRESS NOTE  Subjective/Complaints:   Pt reports shower this AM- is excited.  LBM yesterday- no issues- per OT, the reason they aren't moving patch is pt is unable to completely close L eye, so not alternating eye patch to R side at all.   Plan is d/c tomorrow.   ROS:   Pt denies SOB, abd pain, CP, N/V/C/D, and vision changes   Objective: Vital Signs: Blood pressure (!) 146/74, pulse (!) 43, temperature 98.1 F (36.7 C), temperature source Oral, resp. rate 18, height 5\' 2"  (1.575 m), weight 83.8 kg, SpO2 98 %. No results found. Recent Labs    03/09/20 0448  WBC 6.1  HGB 11.1*  HCT 35.2*  PLT 160   Recent Labs    03/09/20 0448  NA 142  K 3.4*  CL 109  CO2 25  GLUCOSE 84  BUN 15  CREATININE 1.03*  CALCIUM 8.8*    Physical Exam: BP (!) 146/74 (BP Location: Left Arm)   Pulse (!) 43   Temp 98.1 F (36.7 C) (Oral)   Resp 18   Ht 5\' 2"  (1.575 m)   Wt 83.8 kg   SpO2 98%   BMI 33.79 kg/m   Constitutional: No distress . Vital signs reviewed. Up in bedside chair, appropriate, NAD HENT: Normocephalic.  Atraumatic.  Poor dentition. Eyes: L eye with patch- when checked- unable to close L eye even when tries- can close ~75% . Cardiovascular: bradycardic, regular rhythm Respiratory: CTA B/L- no W/R/R- good air movement GI: Soft, NT, ND, (+)BS   Skin: Warm and dry.  Intact. Psych: slightly flat, but appropriate Musc: No edema in extremities.  No tenderness in extremities. Neuro: Alert. Left cranial nerve VII palsy, persistent Dysarthria, unchanged Motor: Bilateral upper extremities: 4+/5 proximal distal Left lower extremity: 4-/5 hip flexion, knee extension, 4-4+/5 ankle dorsiflexion Right lower extremity: Hip flexion, knee extension 4-4+/5, ankle dorsiflexion 4/5  Assessment/Plan: 1. Functional deficits secondary to bilateral cerebellar and cerebral infarcts which require 3+ hours per day of interdisciplinary  therapy in a comprehensive inpatient rehab setting.  Physiatrist is providing close team supervision and 24 hour management of active medical problems listed below.  Physiatrist and rehab team continue to assess barriers to discharge/monitor patient progress toward functional and medical goals  Care Tool:  Bathing  Bathing activity did not occur:  (N/A) Body parts bathed by patient: Right arm, Face, Left arm, Chest, Abdomen, Front perineal area, Buttocks, Right upper leg, Left upper leg, Right lower leg, Left lower leg   Body parts bathed by helper: Right arm, Left arm, Chest, Abdomen, Face     Bathing assist Assist Level: Contact Guard/Touching assist     Upper Body Dressing/Undressing Upper body dressing   What is the patient wearing?: Pull over shirt    Upper body assist Assist Level: Supervision/Verbal cueing    Lower Body Dressing/Undressing Lower body dressing      What is the patient wearing?: Incontinence brief, Pants     Lower body assist Assist for lower body dressing: Minimal Assistance - Patient > 75%     Toileting Toileting Toileting Activity did not occur (Clothing management and hygiene only): N/A (no void or bm)  Toileting assist Assist for toileting: Contact Guard/Touching assist     Transfers Chair/bed transfer  Transfers assist     Chair/bed transfer assist level: Contact Guard/Touching assist     Locomotion Ambulation   Ambulation assist   Ambulation activity did not occur: Safety/medical concerns (  decreased strength/activity tolerance)  Assist level: Contact Guard/Touching assist Assistive device: Walker-rolling Max distance: 22ft   Walk 10 feet activity   Assist  Walk 10 feet activity did not occur: Safety/medical concerns (decreased strength activity tolerance)  Assist level: Contact Guard/Touching assist Assistive device: Walker-rolling   Walk 50 feet activity   Assist Walk 50 feet with 2 turns activity did not occur:  Safety/medical concerns (decreased strength activity tolerance)  Assist level: Contact Guard/Touching assist Assistive device: Walker-rolling    Walk 150 feet activity   Assist Walk 150 feet activity did not occur: Safety/medical concerns         Walk 10 feet on uneven surface  activity   Assist Walk 10 feet on uneven surfaces activity did not occur: Safety/medical concerns   Assist level: Minimal Assistance - Patient > 75% Assistive device: Photographer Will patient use wheelchair at discharge?: No Type of Wheelchair: Manual    Wheelchair assist level: Supervision/Verbal cueing Max wheelchair distance: >119ft    Wheelchair 50 feet with 2 turns activity    Assist        Assist Level: Supervision/Verbal cueing   Wheelchair 150 feet activity     Assist     Assist Level: Supervision/Verbal cueing      Medical Problem List and Plan: 1.  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  Status post TPA.  Status post loop recorder placement  Continue CIR  Continue ophthalmic solution/ointments, continue patch.  7/30- will see if can alternate patch, like normal 2.  Antithrombotics: -DVT/anticoagulation: Lovenox.  Vascular study negative             -antiplatelet therapy: Aspirin 81 mg daily and Plavix 85 mg daily x3 weeks then Plavix alone 3. Pain Management: Oxycodone 15 mg every 4 hours as needed chronic pain  Controlled with meds on 7/29  7/30- controlled with home doses of oxycodone- per son, will need for life current meds  Monitor with increased exertion 4. Mood: Melatonin 3 mg nightly, Remeron 15 mg nightly as needed,  BuSpar 30 mg twice daily             -antipsychotic agents: N/A 5. Neuropsych: This patient is ?fully capable of making decisions on her own behalf. 6. Skin/Wound Care: Routine skin checks 7.  Fluids/Electrolytes/Nutrition: Routine in and outs  Reordered daily multivitamin she takes at home 8.  Hypertension.  Monitor with increased mobility  Lisinopril 10, amlodipine 10 PTA   Lisinopril 5 started on 7/12, decreased to 2.5 on 7/17, increased to 5 again on 7/28  Relatively controlled on 7/29  7/31- well controlled- con't meds- no beta blockers due to bradycardia 9.  Hyperlipidemia.    Continue Lipitor 10.  Obesity.  BMI 35.56.  Follow-up dietary 11.  OSA.  Patient on CPAP at home. 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  Follow-up outpatient cardiology service 13. Dementia: Namenda 28 mg daily, Aricept 10 mg nightly. 14.  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  Mild hypoalbuminemia  Supplement initiated on 7/13  16.  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 Sleep disturbance  See #3  Improving overall 19. Bradycardia  Discussed with pharmacy, no offending agents  ECG reviewed, showing bradycardia with first-degree AV block with ?subacute infarct. No complaints  of pain  Per discussion with cardiology, no changes at present per cardiology, follow-up as outpatient.  7/31- HR low to mid 40s- per Cards, will f/u outpatient- no change 20.  Hypokalemia  Potassium 3.4 on 7/29  Supplemented x1 day on 7/26, again on 7/29  7/30- repleted 7/29- will see if can be rechecked outpatient by PCP in next 1-2 weeks. 21.  Macrocytic anemia  Hemoglobin 11.1 on 7/29  Vitamin B12/folate ordered   LOS: 19 days A FACE TO FACE EVALUATION WAS PERFORMED  Candise Crabtree 03/11/2020, 2:24 PM

## 2020-03-11 NOTE — Progress Notes (Signed)
Speech Language Pathology Discharge Summary  Patient Details  Name: [name] MRN: [id number] Date of Birth: 1940/10/05  Today's Date: 03/11/2020 SLP Individual Time: 1638-4536 SLP Individual Time Calculation (min): 22 min   Skilled Therapeutic Interventions:  Pt was seen for completion of skilled education prior to discharge.  No family was present so all education was provided in handout format to facilitate carryover in the home environment.  Pt reports her memory is improving but not yet back to baseline.  SLP encouraged pt to continue using her memory notebook at home and discussed other strategies to assist in recalling daily information such as use of routines and schedules, calendars, and environmental organization.  Pt endorses she remains bothered by the way her speech sounds and the way her mouth is "twisted" when she talks.  SLP provided skilled education regarding speech intelligibility strategies, emphasizing overarticulation, slow rate, and increased vocal intensity.  Pt reports excellent toleration of her currently prescribed diet and could independently verbalize her swallowing precautions to mitigate pocketing.  Continue to recommend a dys 3, thin liquids diet with ongoing ST interventions for further diet progression.  SLP provided pt with a handout of recommended textures and explained that should pt want to try advanced textures to do so in small amounts before incorporating them into full meals.  All handouts were placed in her memory notebook which was then placed in pt's belongings bag per pt request.  All questions were answered to pt's satisfaction at this time.  Pt is ready for discharge tomorrow.      Patient has met 4 of 5 long term goals.  Patient to discharge at overall Modified Independent level.  Reasons goals not met: supervision needed for recall of daily information   Clinical Impression/Discharge Summary:   Pt has made functional gains while inpatient and  is discharging having met 4 out of 5 long term goals.  Pt is currently mod I for tasks due to mild memory deficits and is consuming a dys 3, thin liquids diet with mod I use of swallowing precautions.  Pt education is complete at this time.  Pt has demonstrated improved toleration of advanced solids and use of memory compensatory strategies.  Pt is discharging home with recommendations for ST follow up at next level of care.    Care Partner:  Caregiver Able to Provide Assistance: Yes  Type of Caregiver Assistance: Cognitive  Recommendation:  Home Health SLP  Rationale for SLP Follow Up: Maximize cognitive function and independence   Equipment:     Reasons for discharge: Discharged from hospital   Patient/Family Agrees with Progress Made and Goals Achieved: Yes    Emilio Math 03/11/2020, 4:37 PM

## 2020-03-12 NOTE — Progress Notes (Signed)
Patient discharged home with family, no questions or concerns, all belongings packed.

## 2020-03-13 NOTE — Progress Notes (Signed)
Inpatient Rehabilitation Care Coordinator  Discharge Note  The overall goal for the admission was met for:   Discharge location: Yes. D/c to her son's home. Son and dtr in law to provide assistance to patient.   Length of Stay: Yes. 19 days.   Discharge activity level: Yes. Min A.  Home/community participation: Yes. Limited.   Services provided included: MD, RD, PT, OT, SLP, RN, CM, TR, Pharmacy, Neuropsych and SW  Financial Services: Medicare and Private Insurance: [place] [place]  Follow-up services arranged: Home Health: [facility] for PT/OT/ST/SNA, DME: [employer] and TTB- items to be delivered to the home.    Comments (or additional information): contact pt son [name] #[phone]  Patient/Family verbalized understanding of follow-up arrangements: Yes  Individual responsible for coordination of the follow-up plan: Pt to have assistance coordinating care needs.   Confirmed correct DME delivered:  A  03/13/2020     A  

## 2020-03-20 ENCOUNTER — Encounter: Payer: Medicare Other | Attending: Physical Medicine & Rehabilitation | Admitting: Physical Medicine & Rehabilitation

## 2020-04-11 ENCOUNTER — Telehealth (INDEPENDENT_AMBULATORY_CARE_PROVIDER_SITE_OTHER): Payer: No Typology Code available for payment source | Admitting: Physician Assistant

## 2020-04-11 ENCOUNTER — Telehealth: Payer: Self-pay | Admitting: Physician Assistant

## 2020-04-11 ENCOUNTER — Encounter: Payer: Self-pay | Admitting: Physician Assistant

## 2020-04-11 DIAGNOSIS — R413 Other amnesia: Secondary | ICD-10-CM | POA: Diagnosis not present

## 2020-04-11 DIAGNOSIS — G4700 Insomnia, unspecified: Secondary | ICD-10-CM | POA: Diagnosis not present

## 2020-04-11 DIAGNOSIS — I6312 Cerebral infarction due to embolism of basilar artery: Secondary | ICD-10-CM

## 2020-04-11 DIAGNOSIS — G4733 Obstructive sleep apnea (adult) (pediatric): Secondary | ICD-10-CM

## 2020-04-11 DIAGNOSIS — F0390 Unspecified dementia without behavioral disturbance: Secondary | ICD-10-CM | POA: Diagnosis not present

## 2020-04-11 DIAGNOSIS — F411 Generalized anxiety disorder: Secondary | ICD-10-CM

## 2020-04-11 MED ORDER — MIRTAZAPINE 15 MG PO TABS: 15.0000 mg | ORAL_TABLET | ORAL | 0 refills | Status: DC | PRN

## 2020-04-11 MED ORDER — BUSPIRONE HCL 30 MG PO TABS: 30.0000 mg | ORAL_TABLET | Freq: Two times a day (BID) | ORAL | 0 refills | Status: DC

## 2020-04-11 NOTE — Telephone Encounter (Signed)
[name], ovens are scheduled for a virtual visit with your provider today.    Just as we do with appointments in the office, we must obtain your consent to participate.  Your consent will be active for this visit and any virtual visit you may have with one of our providers in the next 365 days.    If you have a MyChart account, I can also send a copy of this consent to you electronically.  All virtual visits are billed to your insurance company just like a traditional visit in the office.  As this is a virtual visit, video technology does not allow for your provider to perform a traditional examination.  This may limit your provider's ability to fully assess your condition.  If your provider identifies any concerns that need to be evaluated in person or the need to arrange testing such as labs, EKG, etc, we will make arrangements to do so.    Although advances in technology are sophisticated, we cannot ensure that it will always work on either your end or our end.  If the connection with a video visit is poor, we may have to switch to a telephone visit.  With either a video or telephone visit, we are not always able to ensure that we have a secure connection.   I need to obtain your verbal consent now.   Are you willing to proceed with your visit today?   [name] has provided verbal consent on 04/11/2020 for a virtual visit (video or telephone).   Melony Overly, PA-C 04/11/2020  10:03 AM

## 2020-04-11 NOTE — Progress Notes (Signed)
[facility]  Patient ID: [name],  MRN: [id number]  PCP: Sarah Contras, MD  Date of Evaluation: 04/11/2020 Time spent:30 minutes  Chief Complaint:  Chief Complaint    Depression; Anxiety; Insomnia     Virtual Visit via Telehealth  I connected with patient by telephone, with their informed consent, and verified patient privacy and that I am speaking with the correct person using two identifiers.  I am private, in my office and the patient is at her son's home.  I discussed the limitations, risks, security and privacy concerns of performing an evaluation and management service by telephone and the availability of in person appointments. I also discussed with the patient that there may be a patient responsible charge related to this service. The patient expressed understanding and agreed to proceed.   I discussed the assessment and treatment plan with the patient. The patient was provided an opportunity to ask questions and all were answered. The patient agreed with the plan and demonstrated an understanding of the instructions.   The patient was advised to call back or seek an in-person evaluation if the symptoms worsen or if the condition fails to improve as anticipated.  I provided 30 minutes of non-face-to-face time during this encounter.  HISTORY/CURRENT STATUS: HPI For routine med check.  [name] is on the phone as well.  Doing well as far as her mental health goes.  States her medications are working well.  See review of systems.  She does have trouble sleeping at times.  She is taking the mirtazapine only a few nights a week, if that much.  It is effective when needed.  Sometimes [name] will give her Tylenol PM instead of the mirtazapine and that works well.  She is able to enjoy things as she can.  Of course having the recent strokes, she is not able to do much.  Her energy and motivation are good.  She is not crying easily.  Not isolating  any more than she has to due to her physical circumstances.  No suicidal or homicidal thoughts.  Anxiety is well controlled with the BuSpar.  Not having panic attacks or severe episodes of generalized anxiety.   Individual Medical History/ Review of Systems: Changes? :Yes  went into hospital in July 7th for 1 week and then 2 weeks in rehab, and now is at [name]'s house. Had multiple strokes. Speech was affected but is getting better. Right sided weakness. Is getting home health care since she has been home.  That is going well.  Her goal is to definitely get back into her own home.  Allergies: T-pa [alteplase] and Codeine  Current Medications:  Current Outpatient Medications:    acetaminophen (TYLENOL) 325 MG tablet, Take 2 tablets (650 mg total) by mouth every 4 (four) hours as needed for mild pain (or temp > 37.5 C (99.5 F))., Disp: , Rfl:    amLODipine (NORVASC) 10 MG tablet, Take 10 mg by mouth daily., Disp: , Rfl:    artificial tears (LACRILUBE) OINT ophthalmic ointment, Place into the left eye at bedtime. Apply left eye bedtime, Disp: 1 Tube, Rfl: 1   aspirin EC 81 MG EC tablet, Take 1 tablet (81 mg total) by mouth daily., Disp: 90 tablet, Rfl: 3   atorvastatin (LIPITOR) 20 MG tablet, Take 1 tablet (20 mg total) by mouth at bedtime., Disp: 30 tablet, Rfl: 0   busPIRone (BUSPAR) 30 MG tablet, Take 1 tablet (30 mg total)  by mouth 2 (two) times daily., Disp: 180 tablet, Rfl: 0   cholecalciferol (VITAMIN D3) 25 MCG (1000 UNIT) tablet, Take 1 tablet (1,000 Units total) by mouth daily., Disp: 30 tablet, Rfl: 0   clopidogrel (PLAVIX) 75 MG tablet, Take 1 tablet (75 mg total) by mouth daily., Disp: 30 tablet, Rfl: 0   cycloSPORINE (RESTASIS) 0.05 % ophthalmic emulsion, Place 1 drop into both eyes daily as needed (dry eyes)., Disp: 0.4 mL, Rfl:    diphenhydramine-acetaminophen (TYLENOL PM) 25-500 MG TABS tablet, Take 1 tablet by mouth at bedtime as needed., Disp: , Rfl:     donepezil (ARICEPT) 10 MG tablet, Take 1 tablet (10 mg total) by mouth at bedtime., Disp: 30 tablet, Rfl: 0   lisinopril (ZESTRIL) 5 MG tablet, Take 1 tablet (5 mg total) by mouth daily., Disp: 30 tablet, Rfl: 0   melatonin 3 MG TABS tablet, Take 1 tablet (3 mg total) by mouth at bedtime., Disp: 30 tablet, Rfl: 0   memantine (NAMENDA XR) 28 MG CP24 24 hr capsule, Take 1 capsule (28 mg total) by mouth daily., Disp: 30 capsule, Rfl: 0   mirtazapine (REMERON) 15 MG tablet, Take 1 tablet (15 mg total) by mouth as needed (at bedtime)., Disp: 90 tablet, Rfl: 0   Multiple Vitamins-Minerals (MULTI-VITAMIN GUMMIES PO), Take 1 each by mouth daily. , Disp: , Rfl:    nitroGLYCERIN (NITROSTAT) 0.4 MG SL tablet, Place 1 tablet (0.4 mg total) under the tongue every 5 (five) minutes as needed for chest pain., Disp: 25 tablet, Rfl: 2   oxyCODONE (ROXICODONE) 15 MG immediate release tablet, Take 1 tablet (15 mg total) by mouth 4 (four) times daily as needed (chronic pain)., Disp: 30 tablet, Rfl: 0   oxyCODONE (ROXICODONE) 15 MG immediate release tablet, Take 1 tablet (15 mg total) by mouth 4 (four) times daily as needed (chronic pain)., Disp: 30 tablet, Rfl: 0   polyethylene glycol (MIRALAX / GLYCOLAX) 17 g packet, Take 17 g by mouth 2 (two) times daily., Disp: 14 each, Rfl: 0   polyvinyl alcohol (LIQUIFILM TEARS) 1.4 % ophthalmic solution, Place 2 drops into the left eye 4 (four) times daily., Disp: 15 mL, Rfl: 0 Medication Side Effects: none  Family Medical/ Social History: Changes? Yes is staying at her son's house for now.  MENTAL HEALTH EXAM:  There were no vitals taken for this visit.There is no height or weight on file to calculate BMI.  General Appearance: Unable to assess  Eye Contact:  Unable to assess  Speech:  Clear and Coherent, Normal Rate and I do not detect any slurred speech over the phone  Volume:  Normal  Mood:  Euthymic  Affect:  Unable to assess  Thought Process:  Goal Directed  and Descriptions of Associations: Intact  Orientation:  Full (Time, Place, and Person)  Thought Content: Logical   Suicidal Thoughts:  No  Homicidal Thoughts:  No  Memory:  Fair, she is on Aricept and Namenda per neurology.  Judgement:  Good  Insight:  Good  Psychomotor Activity:  Unable to assess  Concentration:  Concentration: Good  Recall:  Good  Fund of Knowledge: Good  Language: Good  Assets:  Desire for Improvement  ADL's:  Impaired  Cognition: WNL  Prognosis:  Good    DIAGNOSES:    ICD-10-CM   1. Generalized anxiety disorder  F41.1   2. Insomnia, unspecified type  G47.00   3. Memory loss  R41.3   4. Dementia without behavioral disturbance, unspecified dementia  type (HCC)  F03.90   5. OSA (obstructive sleep apnea)  G47.33   6. 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 I provided 30 minutes of nonface-to-face time during this encounter, including time spent reviewing her records. I am glad to hear that she is doing so much better! We discussed her recent physical illness and the inability to fully take care of herself right now.  She is working diligently to try to get back in her own home, where she does have assistance, I believe living with her granddaughter. Make sure to take the medications before bedtime and get in the bed fairly soon after taking them, so there will not be any imbalance issues due to the antihistamine with the Tylenol PM and of course the sedating effect with the mirtazapine. Continue BuSpar 30 mg, 1 p.o. twice daily. Continue melatonin 3 mg, 1 p.o. nightly as needed sleep Continue mirtazapine 15 mg, 1 p.o. nightly as needed. Continue Tylenol PM OTC as needed. Continue Namenda and Aricept per neurology. Return in 3 months.  Melony Overly, PA-C

## 2020-04-24 ENCOUNTER — Inpatient Hospital Stay: Payer: Medicare Other | Admitting: Adult Health

## 2020-05-05 ENCOUNTER — Inpatient Hospital Stay (HOSPITAL_COMMUNITY): Payer: Medicare Other

## 2020-05-05 ENCOUNTER — Inpatient Hospital Stay (HOSPITAL_COMMUNITY)
Admission: EM | Admit: 2020-05-05 | Discharge: 2020-05-13 | DRG: 175 | Disposition: A | Discharge status: Rehabilitation Facility | Payer: Medicare Other | Attending: Internal Medicine | Admitting: Internal Medicine

## 2020-05-05 ENCOUNTER — Encounter (HOSPITAL_COMMUNITY): Payer: Self-pay | Admitting: Emergency Medicine

## 2020-05-05 ENCOUNTER — Emergency Department (HOSPITAL_COMMUNITY): Payer: Medicare Other

## 2020-05-05 DIAGNOSIS — L03115 Cellulitis of right lower limb: Secondary | ICD-10-CM | POA: Diagnosis not present

## 2020-05-05 DIAGNOSIS — G9340 Encephalopathy, unspecified: Secondary | ICD-10-CM | POA: Diagnosis not present

## 2020-05-05 DIAGNOSIS — G8929 Other chronic pain: Secondary | ICD-10-CM | POA: Diagnosis not present

## 2020-05-05 DIAGNOSIS — I2699 Other pulmonary embolism without acute cor pulmonale: Secondary | ICD-10-CM | POA: Diagnosis present

## 2020-05-05 DIAGNOSIS — M419 Scoliosis, unspecified: Secondary | ICD-10-CM | POA: Diagnosis not present

## 2020-05-05 DIAGNOSIS — M545 Low back pain, unspecified: Secondary | ICD-10-CM | POA: Diagnosis present

## 2020-05-05 DIAGNOSIS — E785 Hyperlipidemia, unspecified: Secondary | ICD-10-CM | POA: Diagnosis not present

## 2020-05-05 DIAGNOSIS — Z20822 Contact with and (suspected) exposure to covid-19: Secondary | ICD-10-CM | POA: Diagnosis present

## 2020-05-05 DIAGNOSIS — F329 Major depressive disorder, single episode, unspecified: Secondary | ICD-10-CM | POA: Diagnosis present

## 2020-05-05 DIAGNOSIS — I440 Atrioventricular block, first degree: Secondary | ICD-10-CM | POA: Diagnosis present

## 2020-05-05 DIAGNOSIS — G9341 Metabolic encephalopathy: Secondary | ICD-10-CM | POA: Diagnosis present

## 2020-05-05 DIAGNOSIS — I110 Hypertensive heart disease with heart failure: Secondary | ICD-10-CM | POA: Diagnosis present

## 2020-05-05 DIAGNOSIS — I5181 Takotsubo syndrome: Secondary | ICD-10-CM | POA: Diagnosis not present

## 2020-05-05 DIAGNOSIS — I10 Essential (primary) hypertension: Secondary | ICD-10-CM | POA: Diagnosis not present

## 2020-05-05 DIAGNOSIS — D539 Nutritional anemia, unspecified: Secondary | ICD-10-CM | POA: Diagnosis present

## 2020-05-05 DIAGNOSIS — T148XXA Other injury of unspecified body region, initial encounter: Secondary | ICD-10-CM | POA: Diagnosis not present

## 2020-05-05 DIAGNOSIS — L7632 Postprocedural hematoma of skin and subcutaneous tissue following other procedure: Secondary | ICD-10-CM | POA: Diagnosis not present

## 2020-05-05 DIAGNOSIS — B9620 Unspecified Escherichia coli [E. coli] as the cause of diseases classified elsewhere: Secondary | ICD-10-CM | POA: Diagnosis present

## 2020-05-05 DIAGNOSIS — E876 Hypokalemia: Secondary | ICD-10-CM | POA: Diagnosis present

## 2020-05-05 DIAGNOSIS — N390 Urinary tract infection, site not specified: Secondary | ICD-10-CM | POA: Diagnosis present

## 2020-05-05 DIAGNOSIS — I361 Nonrheumatic tricuspid (valve) insufficiency: Secondary | ICD-10-CM | POA: Diagnosis not present

## 2020-05-05 DIAGNOSIS — Z888 Allergy status to other drugs, medicaments and biological substances status: Secondary | ICD-10-CM | POA: Diagnosis not present

## 2020-05-05 DIAGNOSIS — R600 Localized edema: Secondary | ICD-10-CM | POA: Diagnosis not present

## 2020-05-05 DIAGNOSIS — I82453 Acute embolism and thrombosis of peroneal vein, bilateral: Secondary | ICD-10-CM | POA: Diagnosis present

## 2020-05-05 DIAGNOSIS — G479 Sleep disorder, unspecified: Secondary | ICD-10-CM | POA: Diagnosis not present

## 2020-05-05 DIAGNOSIS — M1990 Unspecified osteoarthritis, unspecified site: Secondary | ICD-10-CM | POA: Diagnosis present

## 2020-05-05 DIAGNOSIS — R579 Shock, unspecified: Secondary | ICD-10-CM | POA: Diagnosis present

## 2020-05-05 DIAGNOSIS — Z79899 Other long term (current) drug therapy: Secondary | ICD-10-CM

## 2020-05-05 DIAGNOSIS — R7401 Elevation of levels of liver transaminase levels: Secondary | ICD-10-CM | POA: Diagnosis not present

## 2020-05-05 DIAGNOSIS — Z87891 Personal history of nicotine dependence: Secondary | ICD-10-CM

## 2020-05-05 DIAGNOSIS — R55 Syncope and collapse: Secondary | ICD-10-CM | POA: Diagnosis present

## 2020-05-05 DIAGNOSIS — F419 Anxiety disorder, unspecified: Secondary | ICD-10-CM | POA: Diagnosis present

## 2020-05-05 DIAGNOSIS — G4700 Insomnia, unspecified: Secondary | ICD-10-CM | POA: Diagnosis present

## 2020-05-05 DIAGNOSIS — F0390 Unspecified dementia without behavioral disturbance: Secondary | ICD-10-CM | POA: Diagnosis present

## 2020-05-05 DIAGNOSIS — I5042 Chronic combined systolic (congestive) and diastolic (congestive) heart failure: Secondary | ICD-10-CM | POA: Diagnosis present

## 2020-05-05 DIAGNOSIS — D62 Acute posthemorrhagic anemia: Secondary | ICD-10-CM | POA: Diagnosis not present

## 2020-05-05 DIAGNOSIS — R778 Other specified abnormalities of plasma proteins: Secondary | ICD-10-CM | POA: Diagnosis not present

## 2020-05-05 DIAGNOSIS — Z7902 Long term (current) use of antithrombotics/antiplatelets: Secondary | ICD-10-CM

## 2020-05-05 DIAGNOSIS — R7989 Other specified abnormal findings of blood chemistry: Secondary | ICD-10-CM | POA: Diagnosis present

## 2020-05-05 DIAGNOSIS — Z96652 Presence of left artificial knee joint: Secondary | ICD-10-CM | POA: Diagnosis present

## 2020-05-05 DIAGNOSIS — I82431 Acute embolism and thrombosis of right popliteal vein: Secondary | ICD-10-CM | POA: Diagnosis present

## 2020-05-05 DIAGNOSIS — J9601 Acute respiratory failure with hypoxia: Secondary | ICD-10-CM | POA: Diagnosis present

## 2020-05-05 DIAGNOSIS — F0151 Vascular dementia with behavioral disturbance: Secondary | ICD-10-CM

## 2020-05-05 DIAGNOSIS — D72829 Elevated white blood cell count, unspecified: Secondary | ICD-10-CM | POA: Diagnosis not present

## 2020-05-05 DIAGNOSIS — I428 Other cardiomyopathies: Secondary | ICD-10-CM | POA: Diagnosis present

## 2020-05-05 DIAGNOSIS — K5901 Slow transit constipation: Secondary | ICD-10-CM | POA: Diagnosis not present

## 2020-05-05 DIAGNOSIS — Y848 Other medical procedures as the cause of abnormal reaction of the patient, or of later complication, without mention of misadventure at the time of the procedure: Secondary | ICD-10-CM | POA: Diagnosis not present

## 2020-05-05 DIAGNOSIS — G4733 Obstructive sleep apnea (adult) (pediatric): Secondary | ICD-10-CM | POA: Diagnosis present

## 2020-05-05 DIAGNOSIS — Z885 Allergy status to narcotic agent status: Secondary | ICD-10-CM | POA: Diagnosis not present

## 2020-05-05 DIAGNOSIS — R52 Pain, unspecified: Secondary | ICD-10-CM

## 2020-05-05 DIAGNOSIS — R609 Edema, unspecified: Secondary | ICD-10-CM | POA: Diagnosis not present

## 2020-05-05 DIAGNOSIS — Z86711 Personal history of pulmonary embolism: Secondary | ICD-10-CM

## 2020-05-05 DIAGNOSIS — R001 Bradycardia, unspecified: Secondary | ICD-10-CM | POA: Diagnosis present

## 2020-05-05 DIAGNOSIS — K5900 Constipation, unspecified: Secondary | ICD-10-CM | POA: Diagnosis present

## 2020-05-05 DIAGNOSIS — Z86718 Personal history of other venous thrombosis and embolism: Secondary | ICD-10-CM

## 2020-05-05 DIAGNOSIS — R5381 Other malaise: Secondary | ICD-10-CM | POA: Diagnosis not present

## 2020-05-05 DIAGNOSIS — I2602 Saddle embolus of pulmonary artery with acute cor pulmonale: Principal | ICD-10-CM | POA: Diagnosis present

## 2020-05-05 DIAGNOSIS — I071 Rheumatic tricuspid insufficiency: Secondary | ICD-10-CM | POA: Diagnosis present

## 2020-05-05 DIAGNOSIS — Z8673 Personal history of transient ischemic attack (TIA), and cerebral infarction without residual deficits: Secondary | ICD-10-CM

## 2020-05-05 DIAGNOSIS — Z7982 Long term (current) use of aspirin: Secondary | ICD-10-CM

## 2020-05-05 DIAGNOSIS — E875 Hyperkalemia: Secondary | ICD-10-CM | POA: Diagnosis not present

## 2020-05-05 DIAGNOSIS — E8809 Other disorders of plasma-protein metabolism, not elsewhere classified: Secondary | ICD-10-CM | POA: Diagnosis not present

## 2020-05-05 DIAGNOSIS — R404 Transient alteration of awareness: Secondary | ICD-10-CM | POA: Diagnosis not present

## 2020-05-05 DIAGNOSIS — E46 Unspecified protein-calorie malnutrition: Secondary | ICD-10-CM | POA: Diagnosis not present

## 2020-05-05 DIAGNOSIS — R739 Hyperglycemia, unspecified: Secondary | ICD-10-CM | POA: Diagnosis present

## 2020-05-05 DIAGNOSIS — I76 Septic arterial embolism: Secondary | ICD-10-CM | POA: Diagnosis not present

## 2020-05-05 DIAGNOSIS — M549 Dorsalgia, unspecified: Secondary | ICD-10-CM | POA: Diagnosis present

## 2020-05-05 DIAGNOSIS — J45909 Unspecified asthma, uncomplicated: Secondary | ICD-10-CM | POA: Diagnosis present

## 2020-05-05 DIAGNOSIS — N179 Acute kidney failure, unspecified: Secondary | ICD-10-CM | POA: Diagnosis not present

## 2020-05-05 DIAGNOSIS — I82443 Acute embolism and thrombosis of tibial vein, bilateral: Secondary | ICD-10-CM | POA: Diagnosis present

## 2020-05-05 DIAGNOSIS — I351 Nonrheumatic aortic (valve) insufficiency: Secondary | ICD-10-CM | POA: Diagnosis not present

## 2020-05-05 DIAGNOSIS — I82409 Acute embolism and thrombosis of unspecified deep veins of unspecified lower extremity: Secondary | ICD-10-CM

## 2020-05-05 DIAGNOSIS — R9431 Abnormal electrocardiogram [ECG] [EKG]: Secondary | ICD-10-CM | POA: Diagnosis not present

## 2020-05-05 LAB — BASIC METABOLIC PANEL
Anion gap: 11 (ref 5–15)
BUN: 9 mg/dL (ref 8–23)
CO2: 25 mmol/L (ref 22–32)
Calcium: 8.9 mg/dL (ref 8.9–10.3)
Chloride: 109 mmol/L (ref 98–111)
Creatinine, Ser: 1.13 mg/dL — ABNORMAL HIGH (ref 0.44–1.00)
GFR calc Af Amer: 54 mL/min — ABNORMAL LOW (ref >60)
GFR calc non Af Amer: 46 mL/min — ABNORMAL LOW (ref >60)
Glucose, Bld: 161 mg/dL — ABNORMAL HIGH (ref 70–99)
Potassium: 3.2 mmol/L — ABNORMAL LOW (ref 3.5–5.1)
Sodium: 145 mmol/L (ref 135–145)

## 2020-05-05 LAB — RESPIRATORY PANEL BY RT PCR (FLU A&B, COVID)
Influenza A by PCR: NEGATIVE
Influenza B by PCR: NEGATIVE
SARS Coronavirus 2 by RT PCR: NEGATIVE

## 2020-05-05 LAB — I-STAT VENOUS BLOOD GAS, ED
Acid-Base Excess: 5 mmol/L — ABNORMAL HIGH (ref 0.0–2.0)
Bicarbonate: 30.5 mmol/L — ABNORMAL HIGH (ref 20.0–28.0)
Calcium, Ion: 1.16 mmol/L (ref 1.15–1.40)
HCT: 39 % (ref 36.0–46.0)
Hemoglobin: 13.3 g/dL (ref 12.0–15.0)
O2 Saturation: 62 %
Potassium: 4.4 mmol/L (ref 3.5–5.1)
Sodium: 145 mmol/L (ref 135–145)
TCO2: 32 mmol/L (ref 22–32)
pCO2, Ven: 47.2 mmHg (ref 44.0–60.0)
pH, Ven: 7.418 (ref 7.250–7.430)
pO2, Ven: 32 mmHg (ref 32.0–45.0)

## 2020-05-05 LAB — CBC
HCT: 38.7 % (ref 36.0–46.0)
Hemoglobin: 11.9 g/dL — ABNORMAL LOW (ref 12.0–15.0)
MCH: 31.3 pg (ref 26.0–34.0)
MCHC: 30.7 g/dL (ref 30.0–36.0)
MCV: 101.8 fL — ABNORMAL HIGH (ref 80.0–100.0)
Platelets: 154 10*3/uL (ref 150–400)
RBC: 3.8 MIL/uL — ABNORMAL LOW (ref 3.87–5.11)
RDW: 13 % (ref 11.5–15.5)
WBC: 9.8 10*3/uL (ref 4.0–10.5)
nRBC: 0.2 % (ref 0.0–0.2)

## 2020-05-05 LAB — CBG MONITORING, ED: Glucose-Capillary: 159 mg/dL — ABNORMAL HIGH (ref 70–99)

## 2020-05-05 LAB — TROPONIN I (HIGH SENSITIVITY): Troponin I (High Sensitivity): 640 ng/L (ref <18)

## 2020-05-05 LAB — D-DIMER, QUANTITATIVE: D-Dimer, Quant: >20 ug/mL-FEU — ABNORMAL HIGH (ref 0.00–0.50)

## 2020-05-05 MED ORDER — ALTEPLASE (PULMONARY EMBOLISM) INFUSION
50.0000 mg | Freq: Once | INTRAVENOUS | Status: AC
  Administered 2020-05-05: 50 mg via INTRAVENOUS
  Filled 2020-05-05: qty 50

## 2020-05-05 MED ORDER — LACTATED RINGERS IV SOLN: INTRAVENOUS | Status: DC

## 2020-05-05 MED ORDER — LACTATED RINGERS IV BOLUS
500.0000 mL | Freq: Once | INTRAVENOUS | Status: AC
  Administered 2020-05-05: 500 mL via INTRAVENOUS

## 2020-05-05 MED ORDER — POTASSIUM CHLORIDE 10 MEQ/100ML IV SOLN
10.0000 meq | INTRAVENOUS | Status: AC
  Administered 2020-05-05 – 2020-05-06 (×2): 10 meq via INTRAVENOUS
  Filled 2020-05-05 (×2): qty 100

## 2020-05-05 MED ORDER — MEMANTINE HCL ER 28 MG PO CP24
28.0000 mg | ORAL_CAPSULE | Freq: Every day | ORAL | Status: DC
  Administered 2020-05-06 – 2020-05-13 (×8): 28 mg via ORAL
  Filled 2020-05-05 (×8): qty 1

## 2020-05-05 MED ORDER — HEPARIN (PORCINE) 25000 UT/250ML-% IV SOLN: 1050.0000 [IU]/h | INTRAVENOUS | Status: DC

## 2020-05-05 MED ORDER — ENOXAPARIN SODIUM 100 MG/ML ~~LOC~~ SOLN
1.0000 mg/kg | Freq: Once | SUBCUTANEOUS | Status: DC
  Filled 2020-05-05: qty 0.9

## 2020-05-05 MED ORDER — POLYETHYLENE GLYCOL 3350 17 G PO PACK: 17.0000 g | PACK | Freq: Every day | ORAL | Status: DC | PRN

## 2020-05-05 MED ORDER — HEPARIN BOLUS VIA INFUSION
5000.0000 [IU] | Freq: Once | INTRAVENOUS | Status: DC
  Filled 2020-05-05: qty 5000

## 2020-05-05 MED ORDER — BUSPIRONE HCL 10 MG PO TABS
30.0000 mg | ORAL_TABLET | Freq: Two times a day (BID) | ORAL | Status: DC
  Administered 2020-05-06 – 2020-05-13 (×15): 30 mg via ORAL
  Filled 2020-05-05 (×2): qty 3
  Filled 2020-05-05: qty 6
  Filled 2020-05-05 (×13): qty 3

## 2020-05-05 MED ORDER — SODIUM CHLORIDE 0.9 % IV SOLN: 250.0000 mL | Freq: Once | INTRAVENOUS | Status: DC

## 2020-05-05 MED ORDER — INSULIN ASPART 100 UNIT/ML ~~LOC~~ SOLN
0.0000 [IU] | SUBCUTANEOUS | Status: DC
  Administered 2020-05-07 – 2020-05-09 (×9): 1 [IU] via SUBCUTANEOUS
  Administered 2020-05-09: 2 [IU] via SUBCUTANEOUS
  Administered 2020-05-09 – 2020-05-11 (×5): 1 [IU] via SUBCUTANEOUS

## 2020-05-05 MED ORDER — DONEPEZIL HCL 5 MG PO TABS
10.0000 mg | ORAL_TABLET | Freq: Every day | ORAL | Status: DC
  Administered 2020-05-06 – 2020-05-12 (×7): 10 mg via ORAL
  Filled 2020-05-05: qty 2
  Filled 2020-05-05: qty 1
  Filled 2020-05-05 (×5): qty 2
  Filled 2020-05-05: qty 1
  Filled 2020-05-05: qty 2

## 2020-05-05 MED ORDER — IOHEXOL 350 MG/ML SOLN
80.0000 mL | Freq: Once | INTRAVENOUS | Status: AC | PRN
  Administered 2020-05-05: 80 mL via INTRAVENOUS

## 2020-05-05 MED ORDER — DIPHENHYDRAMINE HCL 50 MG/ML IJ SOLN
25.0000 mg | Freq: Once | INTRAMUSCULAR | Status: AC
  Administered 2020-05-05: 25 mg via INTRAVENOUS
  Filled 2020-05-05: qty 1

## 2020-05-05 MED ORDER — ASPIRIN 81 MG PO CHEW: 324.0000 mg | CHEWABLE_TABLET | Freq: Once | ORAL | Status: DC

## 2020-05-05 MED ORDER — DOCUSATE SODIUM 100 MG PO CAPS: 100.0000 mg | ORAL_CAPSULE | Freq: Two times a day (BID) | ORAL | Status: DC | PRN

## 2020-05-05 NOTE — Progress Notes (Addendum)
-   Plan for low dose TPA after consulting and discussing with CCM.  - Plan for Stat aptt and cbc post alteplase ~ 30 min post  - If aptt < 80 after alteplase start patient on heparin @ 850 u/h with HL goal of 0.3-0.5 for first 24 hr then can increase heparin level goal to 0.3-0.7 after  ANTICOAGULATION CONSULT NOTE  Pharmacy Consult for Heparin  Indication: chest pain/ACS and pulmonary embolus  Allergies  Allergen Reactions  . T-Pa [Alteplase] Swelling    Lip swelling immediately post tPA  . Codeine Itching    Patient Measurements: Height: 5\' 2"  (157.5 cm) Weight: 89.9 kg (198 lb 3.1 oz) IBW/kg (Calculated) : 50.1 Heparin Dosing Weight: 70.8 kg  Vital Signs: Temp: 97.7 F (36.5 C) (09/24 1545) Temp Source: Oral (09/24 1545) BP: 113/78 (09/24 1945) Pulse Rate: 67 (09/24 1945)  Labs: Recent Labs    05/05/20 1429 05/05/20 1825 05/05/20 2018  HGB 11.9*  --  13.3  HCT 38.7  --  39.0  PLT 154  --   --   CREATININE 1.13*  --   --   TROPONINIHS  --  640*  --     Estimated Creatinine Clearance: 42.1 mL/min (A) (by C-G formula based on SCr of 1.13 mg/dL (H)).   Medical History: Past Medical History:  Diagnosis Date  . Arthritis   . Asthma   . Chronic back pain   . Depression   . History of pulmonary embolus (PE)   . Hx-TIA (transient ischemic attack)   . Hyperlipemia   . Hypertension   . Personal history of DVT (deep vein thrombosis)   . Scoliosis   . Sleep apnea    cpap - settingsi at 3   . 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 aspirin  324 mg Oral Once  . heparin  5,000 Units Intravenous Once    Assessment: Patient is a 66 yof that presents after a syncopal episode and was found to have elevated Trop levels. Patient was also fount to have large bilateral PEs with RV strain. Pharmacy has been asked to dose heparin at this time.   Goal of Therapy:  Heparin level 0.3-0.7 units/ml Monitor platelets by anticoagulation protocol:  Yes   Plan:  - Heparin bolus 5000 unit IV x 1 dose - Heparin drip @ 1050 units/hr - Heparin level in ~ 8 hours  - Monitor patient for s/s of bleeding and CBC while on heparin   76 PharmD. BCPS  05/05/2020,9:25 PM

## 2020-05-05 NOTE — Consult Note (Addendum)
NAME:  [name], MRN:  [id number], DOB:  1941-06-01, LOS: 0 ADMISSION DATE:  05/05/2020, CONSULTATION DATE:  05/05/20 REFERRING MD:  Dr. Margo Aye, CHIEF COMPLAINT:  PE   Brief History   79 y.o. F with PMH CVA in 7/21, Takotsubo cardiomyopathy, DVT/PE, HTN, OSA who had a near syncopal event while getting in the shower and was found to have submassive PE with saddle emboli and evidence of right heart strain.  History of present illness   [name] is a 78 year old female with PMH significant for CVA in 7/21 with loop recorder, Takotsubo cardiomyopathy, DVT/PE not on anticoagulation, HTN, OSA who had a near syncopal event while getting in the shower.  She presented to the emergency department with dyspnea and soft blood pressures, did not require supplemental oxygen or pressors.  However CTA significant for submassive bilateral PE with saddle emboli and evidence of right heart strain.  She had an elevated troponin of  640, BNP pending.  Cardiology consulted and did not suspect ACS.   She was initially admitted to progressive care, however given CT results, PCCM was consulted.  Given her large clot burden TPA indicated, she does have a documented allergy when this was given 2 months ago at the time of her ischemic CVA.  Per her son this was mild lip swelling.  Neurology also reviewed case and felt that though she has had a recent stroke, the benefit of TPA outweighs risk.  This was discussed with her son who was in agreement, patient to be given TPA and admitted to intensive care.  Past Medical History   has a past medical history of Arthritis, Asthma, Chronic back pain, Depression, History of pulmonary embolus (PE), TIA (transient ischemic attack), Hyperlipemia, Hypertension, Personal history of DVT (deep vein thrombosis), Scoliosis, Sleep apnea, and Takotsubo cardiomyopathy (09/16/2019).  Significant Hospital Events   9/24 admit to intensive care  Consults:  Cardiology  Procedures:     Significant Diagnostic Tests:  9/24 CTA chest>>Large filling defects are demonstrated in the central, lobar, and segmental pulmonary arteries bilaterally consistent with acute pulmonary embolus including saddle embolus and large clot burden. 9/24 CT head>> findings stable chronic ischemia  Micro Data:  9/24 SARS-Cov-2>> negative  Antimicrobials:    Interim history/subjective:  As above  Objective   Blood pressure 113/78, pulse 67, temperature 97.7 F (36.5 C), temperature source Oral, resp. rate 13, height 5\' 2"  (1.575 m), weight 89.9 kg, SpO2 98 %.        Intake/Output Summary (Last 24 hours) at 05/05/2020 2218 Last data filed at 05/05/2020 1715 Gross per 24 hour  Intake 500.33 ml  Output --  Net 500.33 ml   Filed Weights   05/05/20 1945  Weight: 89.9 kg   General: Elderly female, sleeping and easily arousable but quickly falls back asleep  HEENT: MM pink/moist Neuro: Oriented to person and place, following commands, somnolent  CV: s1s2 RRR, no m/r/g PULM: CTAB, on 2 L nasal cannula GI: soft, bsx4 active  Extremities: warm/dry, 3+ edema bilaterally  skin: no rashes or lesions   Resolved Hospital Problem list     Assessment & Plan:    Acute, bilateral, submassive saddle pulmonary emboli Patient with soft blood pressures and evidence of right heart strain, PESI score of 179, class V very high risk of 30-day mortality.  Though she has had mild lip swelling post TPA and a history of stroke in the last 6 months after discussion with neurology and patient's son the decision was  made to move forward with half dose TPA. -Admit to intensive care for close monitoring -Pre-treat with Benadryl and monitor for facial swelling or signs of allergic reaction -Start heparin post TPA  -Serial neuro exams -Doppler bilateral lower extremities -Formal echocardiogram   History of Takotsubo cardiomyopathy, hypertension, HL Elevated troponin of 600 -Prior EF of 30 to 35% which  improved on repeat echo -Appreciate cardiology recommendations, do not suspect ACS -Loop recorder interrogated in the ED and demonstrated only short pauses -Hold home antihypertensives-amlodipine and lisinopril -Echo pending   Dementia Continue Namenda and Aricept   Depression Continue BuSpar   Elevated creatinine Creatinine minimally elevated at 1.13, near baseline -Continue to monitor   Hyperglycemia Glucose 160s in the ED -Hemoglobin A1c and SSI  Best practice:  Diet: As tolerated Pain/Anxiety/Delirium protocol (if indicated): N/A VAP protocol (if indicated): N/A DVT prophylaxis: Heparin GI prophylaxis: N/A Glucose control: SSI Mobility: Bedrest Code Status: Full code Family Communication: Son updated by phone Disposition: ICU  Labs   CBC: Recent Labs  Lab 05/05/20 1429 05/05/20 2018  WBC 9.8  --   HGB 11.9* 13.3  HCT 38.7 39.0  MCV 101.8*  --   PLT 154  --     Basic Metabolic Panel: Recent Labs  Lab 05/05/20 1429 05/05/20 2018  NA 145 145  K 3.2* 4.4  CL 109  --   CO2 25  --   GLUCOSE 161*  --   BUN 9  --   CREATININE 1.13*  --   CALCIUM 8.9  --    GFR: Estimated Creatinine Clearance: 42.1 mL/min (A) (by C-G formula based on SCr of 1.13 mg/dL (H)). Recent Labs  Lab 05/05/20 1429  WBC 9.8    Liver Function Tests: No results for input(s): AST, ALT, ALKPHOS, BILITOT, PROT, ALBUMIN in the last 168 hours. No results for input(s): LIPASE, AMYLASE in the last 168 hours. No results for input(s): AMMONIA in the last 168 hours.  ABG    Component Value Date/Time   HCO3 30.5 (H) 05/05/2020 2018   TCO2 32 05/05/2020 2018   O2SAT 62.0 05/05/2020 2018     Coagulation Profile: No results for input(s): INR, PROTIME in the last 168 hours.  Cardiac Enzymes: No results for input(s): CKTOTAL, CKMB, CKMBINDEX, TROPONINI in the last 168 hours.  HbA1C: Hgb A1c MFr Bld  Date/Time Value Ref Range Status  02/17/2020 03:50 AM 5.5 4.8 - 5.6 % Final     Comment:    (NOTE) Pre diabetes:          5.7%-6.4%  Diabetes:              >6.4%  Glycemic control for   <7.0% adults with diabetes   09/16/2019 07:50 AM 5.6 4.8 - 5.6 % Final    Comment:    (NOTE) Pre diabetes:          5.7%-6.4% Diabetes:              >6.4% Glycemic control for   <7.0% adults with diabetes     CBG: Recent Labs  Lab 05/05/20 1430  GLUCAP 159*    Review of Systems:   Negative except as noted in HPI  Past Medical History  She,  has a past medical history of Arthritis, Asthma, Chronic back pain, Depression, History of pulmonary embolus (PE), TIA (transient ischemic attack), Hyperlipemia, Hypertension, Personal history of DVT (deep vein thrombosis), Scoliosis, Sleep apnea, and Takotsubo cardiomyopathy (09/16/2019).   Surgical History    Past  Surgical History:  Procedure Laterality Date  . ARTHROSCOPIC REPAIR ACL  02/03/2004  . LEFT HEART CATH AND CORONARY ANGIOGRAPHY N/A 09/16/2019   Procedure: LEFT HEART CATH AND CORONARY ANGIOGRAPHY;  Surgeon: Yvonne Kendall, MD;  Location: [facility] INVASIVE CV LAB;  Service: Cardiovascular;  Laterality: N/A;  . LOOP RECORDER INSERTION N/A 02/21/2020   Procedure: LOOP RECORDER INSERTION;  Surgeon: Duke Salvia, MD;  Location: [facility] INVASIVE CV LAB;  Service: Cardiovascular;  Laterality: N/A;  . OOPHORECTOMY  1970  . SPINE SURGERY  03/31/2006  . TOTAL KNEE ARTHROPLASTY Left 05/17/2016   Procedure: LEFT TOTAL KNEE ARTHROPLASTY;  Surgeon: Kathryne Hitch, MD;  Location: [facility] ORS;  Service: Orthopedics;  Laterality: Left;  Adductor Block; Spinal to General  . TUBAL LIGATION  1983     Social History   reports that she quit smoking about 46 years ago. Her smoking use included cigarettes. She has a 10.00 pack-year smoking history. She has never used smokeless tobacco. She reports that she does not drink alcohol and does not use drugs.   Family History   Her family history includes Other in her father.    Allergies Allergies  Allergen Reactions  . T-Pa [Alteplase] Swelling    Lip swelling immediately post tPA  . Codeine Itching     Home Medications  Prior to Admission medications   Medication Sig Start Date End Date Taking? Authorizing Provider  acetaminophen (TYLENOL) 325 MG tablet Take 2 tablets (650 mg total) by mouth every 4 (four) hours as needed for mild pain (or temp > 37.5 C (99.5 F)). 02/21/20   Layne Benton, NP  amLODipine (NORVASC) 10 MG tablet Take 10 mg by mouth daily. 03/02/20   [provider]  artificial tears (LACRILUBE) OINT ophthalmic ointment Place into the left eye at bedtime. Apply left eye bedtime 03/10/20   Angiulli, Mcarthur Rossetti, PA-C  aspirin EC 81 MG EC tablet Take 1 tablet (81 mg total) by mouth daily. 09/18/19   Duke, Roe Rutherford, PA  atorvastatin (LIPITOR) 20 MG tablet Take 1 tablet (20 mg total) by mouth at bedtime. 03/09/20   Angiulli, Mcarthur Rossetti, PA-C  busPIRone (BUSPAR) 30 MG tablet Take 1 tablet (30 mg total) by mouth 2 (two) times daily. 04/11/20   Cherie Ouch, PA-C  cholecalciferol (VITAMIN D3) 25 MCG (1000 UNIT) tablet Take 1 tablet (1,000 Units total) by mouth daily. 03/09/20   Angiulli, Mcarthur Rossetti, PA-C  clopidogrel (PLAVIX) 75 MG tablet Take 1 tablet (75 mg total) by mouth daily. 03/09/20   Angiulli, Mcarthur Rossetti, PA-C  cycloSPORINE (RESTASIS) 0.05 % ophthalmic emulsion Place 1 drop into both eyes daily as needed (dry eyes). 02/21/20   Layne Benton, NP  diphenhydramine-acetaminophen (TYLENOL PM) 25-500 MG TABS tablet Take 1 tablet by mouth at bedtime as needed.    [provider]  donepezil (ARICEPT) 10 MG tablet Take 1 tablet (10 mg total) by mouth at bedtime. 03/09/20   Angiulli, Mcarthur Rossetti, PA-C  lisinopril (ZESTRIL) 5 MG tablet Take 1 tablet (5 mg total) by mouth daily. 03/10/20   Angiulli, Mcarthur Rossetti, PA-C  melatonin 3 MG TABS tablet Take 1 tablet (3 mg total) by mouth at bedtime. 03/09/20   Angiulli, Mcarthur Rossetti, PA-C  memantine (NAMENDA XR) 28 MG  CP24 24 hr capsule Take 1 capsule (28 mg total) by mouth daily. 03/09/20   Angiulli, Mcarthur Rossetti, PA-C  mirtazapine (REMERON) 15 MG tablet Take 1 tablet (15 mg total) by mouth as needed (at  bedtime). 04/11/20   Melony Overly T, PA-C  Multiple Vitamins-Minerals (MULTI-VITAMIN GUMMIES PO) Take 1 each by mouth daily.     [provider]  nitroGLYCERIN (NITROSTAT) 0.4 MG SL tablet Place 1 tablet (0.4 mg total) under the tongue every 5 (five) minutes as needed for chest pain. 09/17/19   Duke, Roe Rutherford, PA  oxyCODONE (ROXICODONE) 15 MG immediate release tablet Take 1 tablet (15 mg total) by mouth 4 (four) times daily as needed (chronic pain). 02/21/20   Layne Benton, NP  oxyCODONE (ROXICODONE) 15 MG immediate release tablet Take 1 tablet (15 mg total) by mouth 4 (four) times daily as needed (chronic pain). 03/09/20   Angiulli, Mcarthur Rossetti, PA-C  polyethylene glycol (MIRALAX / GLYCOLAX) 17 g packet Take 17 g by mouth 2 (two) times daily. 03/09/20   Angiulli, Mcarthur Rossetti, PA-C  polyvinyl alcohol (LIQUIFILM TEARS) 1.4 % ophthalmic solution Place 2 drops into the left eye 4 (four) times daily. 02/21/20   Layne Benton, NP     Critical care time: 55 minutes       CRITICAL CARE Performed by: Darcella Gasman Gleason   Total critical care time: 55 minutes  Critical care time was exclusive of separately billable procedures and treating other patients.  Critical care was necessary to treat or prevent imminent or life-threatening deterioration.  Critical care was time spent personally by me on the following activities: development of treatment plan with patient and/or surrogate as well as nursing, discussions with consultants, evaluation of patient's response to treatment, examination of patient, obtaining history from patient or surrogate, ordering and performing treatments and interventions, ordering and review of laboratory studies, ordering and review of radiographic studies, pulse oximetry and re-evaluation of  patient's condition.   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  Patient presented to the emergency room after syncopal event in the shower.  Was found to have an elevated D-dimer.  Patient sent to the CT scanner for CTA which revealed bilateral pulmonary emboli, significant right heart strain RV/LV ratio of 2.8, elevated troponin.  Of note her previously documented blood pressures had systolic BP of 150-160s.  In the emergency department patient's blood pressure on initial presentation was 98/57.  She has subsequently had several other blood pressures systolic in the 90s.  She is currently maintained on nasal cannula O2 supplementation.  Additionally she is bradycardic and her heart rate has slowly been dropping since admission to the emergency department she initially came in with a heart rate in the 80s and has now down into the low 60s.  I had a discussion with Dr. Otelia Limes from neurology about her recent stroke.  She did receive TPA at that time with no adverse event except for lip swelling.  There is a documented allergy to this now and epic however nothing further.  Dr. Otelia Limes felt as if reviewing her recent MRI and CT imaging that we felt if TPA was needed for her PE that the risk of her recent CVA would be low for bleeding.  I called and spoke with the patient's son regarding her current clinical situation and and worrisome features of her presentation for this large PE.  Patient's son was in agreement for systemic TPA if we felt necessary.  I also discussed case with pharmacy regarding allergy to TPA with lip swelling.  BP 109/74  Pulse 68   Temp 97.7 F (36.5 C) (Oral)   Resp 11   Ht  (1.575 m)   Wt 89.9 kg   SpO2 99%   BMI 36.25 kg/m   General: Elderly female resting comfortably in bed Heart: Regular rhythm  S1-S2, S2 louder than S1. Abdomen: Soft nontender nondistended Lungs: Clear to auscultation bilaterally no crackles no wheeze Extremities: Bilateral edema.  Labs: Reviewed  Assessment: Submassive pulmonary embolism with presentation of syncope, significant right ventricular strain, elevated troponin Acute hypoxemic respiratory failure Relative hypotension secondary to baseline, approximately 40-50 systolic blood pressure points lower than normal. Bradycardia, which she has had documented in the past. History of stress-induced cardiomyopathy, chronic systolic heart failure.  Plan: Systemic TPA consent from family as described above Start heparin, PTT checks per pharmacy. Admit to the intensive care unit for close hemodynamic monitoring. Bilateral Dopplers Echocardiogram Neuro exams post TPA, TPA order set placed Hold home antihypertensives  This patient is critically ill with multiple organ system failure; which, requires frequent high complexity decision making, assessment, support, evaluation, and titration of therapies. This was completed through the application of advanced monitoring technologies and extensive interpretation of multiple databases. During this encounter critical care time was devoted to patient care services described in this note for 48 minutes.'  Josephine Igo, DO [facility] 05/05/2020 11:36 PM

## 2020-05-05 NOTE — H&P (Addendum)
History and Physical  RAIZA KIESEL ZDG:[id number] DOB: May 06, 1941 DOA: 05/05/2020  Referring physician: Dr. Myrtis Ser  PCP: [name], MD  Outpatient Specialists: Cardiology Patient coming from: Home  Chief Complaint: Near syncope in the shower witnessed at home   HPI: [name] is a 79 y.o. female with medical history significant for mild dementia, CVA, stress-induced cardiomyopathy, PE/DVT off oral anticoagulation, essential hypertension, hyperlipidemia, who presented to [facility] ED due to near syncopal episode in the shower.  History is obtained from the patient's son whom she lives with, EDP, and medical records.  Per her son she was in her usual state of health prior to today.  Had a bowel movement then she was taken in the shower by her grand niece and felt like she was about to pass out.  No recent travel.  Her daughter in law who is a Designer, jewellery thought she was having a TIA and called EMS.  She was brought into the ED for further evaluation and management.  She has received her 2 vaccines, unspecified, for COVID-19.  ED Course: Lab studies remarkable for troponin greater than 600, D-dimer greater than 20.  Serum potassium 3.2, creatinine 1.1, hemoglobin 11.9.  Due to concern for possible ACS versus acute pulmonary embolism stat CTA PE was ordered by EDP.  Cardiology has been consulted by EDP.  TRH asked to admit.  Review of Systems: Review of systems as noted in the HPI. All other systems reviewed and are negative.   Past Medical History:  Diagnosis Date  . Arthritis   . Asthma   . Chronic back pain   . Depression   . History of pulmonary embolus (PE)   . Hx-TIA (transient ischemic attack)   . Hyperlipemia   . Hypertension   . Personal history of DVT (deep vein thrombosis)   . Scoliosis   . Sleep apnea    cpap - settingsi at 2   . 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 ARTHROSCOPIC REPAIR ACL  02/03/2004  .  LEFT HEART CATH AND CORONARY ANGIOGRAPHY N/A 09/16/2019   Procedure: LEFT HEART CATH AND CORONARY ANGIOGRAPHY;  Surgeon: Yvonne Kendall, MD;  Location: [facility] INVASIVE CV LAB;  Service: Cardiovascular;  Laterality: N/A;  . LOOP RECORDER INSERTION N/A 02/21/2020   Procedure: LOOP RECORDER INSERTION;  Surgeon: Duke Salvia, MD;  Location: [facility] INVASIVE CV LAB;  Service: Cardiovascular;  Laterality: N/A;  . OOPHORECTOMY  1970  . SPINE SURGERY  03/31/2006  . TOTAL KNEE ARTHROPLASTY Left 05/17/2016   Procedure: LEFT TOTAL KNEE ARTHROPLASTY;  Surgeon: Kathryne Hitch, MD;  Location: [facility] ORS;  Service: Orthopedics;  Laterality: Left;  Adductor Block; Spinal to General  . TUBAL LIGATION  1983    Social History:  reports that she quit smoking about 46 years ago. Her smoking use included cigarettes. She has a 10.00 pack-year smoking history. She has never used smokeless tobacco. She reports that she does not drink alcohol and does not use drugs.   Allergies  Allergen Reactions  . T-Pa [Alteplase] Swelling    Lip swelling immediately post tPA  . Codeine Itching    Family History  Problem Relation Age of Onset  . Other Father        kidney problems      Prior to Admission medications   Medication Sig Start Date End Date Taking? Authorizing Provider  acetaminophen (TYLENOL) 325 MG tablet Take 2 tablets (650 mg total) by mouth  every 4 (four) hours as needed for mild pain (or temp > 37.5 C (99.5 F)). 02/21/20   Layne Benton, NP  amLODipine (NORVASC) 10 MG tablet Take 10 mg by mouth daily. 03/02/20   [provider]  artificial tears (LACRILUBE) OINT ophthalmic ointment Place into the left eye at bedtime. Apply left eye bedtime 03/10/20   Angiulli, Mcarthur Rossetti, PA-C  aspirin EC 81 MG EC tablet Take 1 tablet (81 mg total) by mouth daily. 09/18/19   Duke, Roe Rutherford, PA  atorvastatin (LIPITOR) 20 MG tablet Take 1 tablet (20 mg total) by mouth at bedtime. 03/09/20   Angiulli, Mcarthur Rossetti, PA-C   busPIRone (BUSPAR) 30 MG tablet Take 1 tablet (30 mg total) by mouth 2 (two) times daily. 04/11/20   Cherie Ouch, PA-C  cholecalciferol (VITAMIN D3) 25 MCG (1000 UNIT) tablet Take 1 tablet (1,000 Units total) by mouth daily. 03/09/20   Angiulli, Mcarthur Rossetti, PA-C  clopidogrel (PLAVIX) 75 MG tablet Take 1 tablet (75 mg total) by mouth daily. 03/09/20   Angiulli, Mcarthur Rossetti, PA-C  cycloSPORINE (RESTASIS) 0.05 % ophthalmic emulsion Place 1 drop into both eyes daily as needed (dry eyes). 02/21/20   Layne Benton, NP  diphenhydramine-acetaminophen (TYLENOL PM) 25-500 MG TABS tablet Take 1 tablet by mouth at bedtime as needed.    [provider]  donepezil (ARICEPT) 10 MG tablet Take 1 tablet (10 mg total) by mouth at bedtime. 03/09/20   Angiulli, Mcarthur Rossetti, PA-C  lisinopril (ZESTRIL) 5 MG tablet Take 1 tablet (5 mg total) by mouth daily. 03/10/20   Angiulli, Mcarthur Rossetti, PA-C  melatonin 3 MG TABS tablet Take 1 tablet (3 mg total) by mouth at bedtime. 03/09/20   Angiulli, Mcarthur Rossetti, PA-C  memantine (NAMENDA XR) 28 MG CP24 24 hr capsule Take 1 capsule (28 mg total) by mouth daily. 03/09/20   Angiulli, Mcarthur Rossetti, PA-C  mirtazapine (REMERON) 15 MG tablet Take 1 tablet (15 mg total) by mouth as needed (at bedtime). 04/11/20   Melony Overly T, PA-C  Multiple Vitamins-Minerals (MULTI-VITAMIN GUMMIES PO) Take 1 each by mouth daily.     [provider]  nitroGLYCERIN (NITROSTAT) 0.4 MG SL tablet Place 1 tablet (0.4 mg total) under the tongue every 5 (five) minutes as needed for chest pain. 09/17/19   Duke, Roe Rutherford, PA  oxyCODONE (ROXICODONE) 15 MG immediate release tablet Take 1 tablet (15 mg total) by mouth 4 (four) times daily as needed (chronic pain). 02/21/20   Layne Benton, NP  oxyCODONE (ROXICODONE) 15 MG immediate release tablet Take 1 tablet (15 mg total) by mouth 4 (four) times daily as needed (chronic pain). 03/09/20   Angiulli, Mcarthur Rossetti, PA-C  polyethylene glycol (MIRALAX / GLYCOLAX) 17 g packet  Take 17 g by mouth 2 (two) times daily. 03/09/20   Angiulli, Mcarthur Rossetti, PA-C  polyvinyl alcohol (LIQUIFILM TEARS) 1.4 % ophthalmic solution Place 2 drops into the left eye 4 (four) times daily. 02/21/20   Layne Benton, NP    Physical Exam: BP 113/78   Pulse 67   Temp 97.7 F (36.5 C) (Oral)   Resp 13   Wt 89.9 kg   SpO2 98%   BMI 36.25 kg/m   . General: 79 y.o. year-old female well developed well nourished in no acute distress.  Somnolent but easily arousable to voices.   . Cardiovascular: Regular rate and rhythm with no rubs or gallops.  No thyromegaly or JVD noted.  Trace lower  extremity edema. 2/4 pulses in all 4 extremities. Marland Kitchen Respiratory: Clear to auscultation with no wheezes or rales.  Poor inspiratory effort. . Abdomen: Soft nontender nondistended with normal bowel sounds x4 quadrants. . Muskuloskeletal: No cyanosis or clubbing.  Trace lower extremity edema bilaterally.  . Neuro: CN II-XII intact, strength, sensation, reflexes . Skin: No ulcerative lesions noted or rashes . Psychiatry: Judgement and insight appear normal. Mood is appropriate for condition and setting          Labs on Admission:  Basic Metabolic Panel: Recent Labs  Lab 05/05/20 1429 05/05/20 2018  NA 145 145  K 3.2* 4.4  CL 109  --   CO2 25  --   GLUCOSE 161*  --   BUN 9  --   CREATININE 1.13*  --   CALCIUM 8.9  --    Liver Function Tests: No results for input(s): AST, ALT, ALKPHOS, BILITOT, PROT, ALBUMIN in the last 168 hours. No results for input(s): LIPASE, AMYLASE in the last 168 hours. No results for input(s): AMMONIA in the last 168 hours. CBC: Recent Labs  Lab 05/05/20 1429 05/05/20 2018  WBC 9.8  --   HGB 11.9* 13.3  HCT 38.7 39.0  MCV 101.8*  --   PLT 154  --    Cardiac Enzymes: No results for input(s): CKTOTAL, CKMB, CKMBINDEX, TROPONINI in the last 168 hours.  BNP (last 3 results) Recent Labs    09/16/19 1410  BNP 91.2    ProBNP (last 3 results) No results for  input(s): PROBNP in the last 8760 hours.  CBG: Recent Labs  Lab 05/05/20 1430  GLUCAP 159*    Radiological Exams on Admission: DG Chest Portable 1 View  Result Date: 05/05/2020 CLINICAL DATA:  Near syncope EXAM: PORTABLE CHEST 1 VIEW COMPARISON:  November 07, 2019 FINDINGS: The heart size is enlarged. Aortic calcifications are noted. The pulmonary arteries appear to be dilated which can be seen in patients with elevated pulmonary artery pressures. There is no pneumothorax or large pleural effusion. There is a device projecting over the patient's left ventricle which may represent a loop recorder or lead less pacemaker. There is no acute displaced fracture. There are degenerative changes of both glenohumeral joints. IMPRESSION: No active disease. Electronically Signed   By: Katherine Mantle M.D.   On: 05/05/2020 16:56    EKG: I independently viewed the EKG done and my findings are as followed: Sinus rhythm rate of 69.  QTc 545.  Assessment/Plan Present on Admission: . Takotsubo cardiomyopathy . Dementia (HCC)  Principal Problem:   Elevated troponin Active Problems:   Takotsubo cardiomyopathy   History of DVT (deep vein thrombosis)   Dementia (HCC)  Near syncope, suspect multifactorial secondary to vasovagal in the setting of hot shower versus others Head CT ordered and results are pending at the time of this dictation  Elevated troponin/severely elevated D-dimer > 20 Rule out ACS versus acute PE Presented with troponin greater than 600 and D-dimer greater than 20 COVID-19 screening test negative CT angio PE ordered and results are pending at the time of this dictation Cycle troponin Started on heparin drip, per pharmacy dosing Obtain BNP and 2D echo  History of PE and DVT, off anticoagulation We will obtain bilateral lower extremity Doppler ultrasound to rule out bilateral lower extremity DVT If positive will need to be on oral anticoagulation indefinitely  History of  stress induced cardiomyopathy Follows with cardiology outpatient Cardiac catheterization was essentially showing normal coronaries by cath earlier this  year per cardiology.  Acute metabolic encephalopathy in the setting of underlying mild dementia Likely in the setting of acute illness CT head pending, follow results Somnolent on exam but easily arousable to voices N.p.o. until fully awake.  Hypokalemia Presented with serum potassium 3.2 Replete Check magnesium level  Hyperglycemia Last hemoglobin A1c 5.5 on 03/10/2020. Presented with serum glucose of 161  Chronic macrocytic anemia Hemoglobin 11.9 with MCV of 101 Check folate and B12 level  History of CVA, currently on Plavix Per her son she was taken off aspirin Resume home Plavix 75 mg daily and Lipitor 20 mg daily  Essential hypertension BPs are soft Hold off home oral antihypertensives for now Give gentle IV fluid  Chronic diastolic CHF 2D echo done on 12/13/2019 showed LVEF 55 to 60% with grade 1 diastolic dysfunction Euvolemic on exam Strict I's and O's and daily Closely monitor volume status while on IV fluid  Dementia/chronic anxiety Resume home regimen She is on memantine, benazepril, BuSpar 15 mg twice daily and Vistaril 10 mg nightly per her son via phone.   DVT prophylaxis: Heparin drip.  Code Status: Full code per her son himself via phone.  Family Communication: Updated her son via phone.  Disposition Plan: Admit to telemetry cardiac.  Consults called: Cardiology consulted by EDP  Admission status: Inpatient status   Status is: Inpatient    Dispo:  Patient From: Home  Planned Disposition: Home with Health Care Svc  Expected discharge date: 05/08/20  Medically stable for discharge: No, ongoing work-up for elevated troponin and elevated D-dimer greater than 20.        Darlin Drop MD [facility] Pager [phone]  If 7PM-7AM, please contact  night-coverage [email or web address] Password [facility]  05/05/2020, 8:52 PM

## 2020-05-05 NOTE — Consult Note (Signed)
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  Subsequent echocardiogram demonstrated normalization of LV function.  She now presents with near syncope getting a shower and troponin is again elevated.  HPI   [name] is a 79 y.o. female who is being seen today for the evaluation of elevated troponin at the request of Dr. Myrtis Ser.  This is a pleasant 79 year old female who was brought to the emergency department during a near syncopal episode.  She apparently was having a normal day and wanted to take a shower felt clammy and became short of breath.  She denied any chest pain.  She was brought to the emergency department and had some borderline low blood pressure.  Laboratory work was performed including a troponin which was 140.  Previous troponin 7 months ago in February was elevated at 553 however subsequent troponins in March were negative.  As described above her presentation in February 2021 was for chest pain.  Underwent left heart catheterization which showed tortuous coronaries however minimal luminal irregularities and it was felt that her troponin elevation was likely due to Takotsubo cardiomyopathy.  In follow-up with Dr. Cristal Deer she had a repeat echo in May 2021 which showed normalization of LV function.  Subsequently saw Dr. Graciela Husbands in July 2021 after being admitted with left facial droop, visual changes and slurred speech along with right-sided weakness.  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  She underwent loop recorder implantation.  The loop  recorder was interrogated during this ER admission and demonstrated some short pauses.  No high rate arrhythmias.  Labs during this admission showed mildly elevated creatinine 1.13, low potassium at 3.2.  High-sensitivity troponin of 640.  D-dimer greater than 20.  Shows an enlarged heart size with no acute disease.  EKG personally reviewed shows sinus rhythm with first-degree AV block and a PVC.  Of note she does have a history of DVT and pulmonary embolus as well in the past.  She has been on aspirin and Plavix but no anticoagulation.  PMHx   Past Medical History:  Diagnosis Date  . Arthritis   . Asthma   . Chronic back pain   . Depression   . History of pulmonary embolus (PE)   . Hx-TIA (transient ischemic attack)   . Hyperlipemia   . Hypertension   . Personal history of DVT (deep vein thrombosis)   . Scoliosis   . Sleep apnea    cpap - settingsi at 3   . 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 ARTHROSCOPIC REPAIR ACL  02/03/2004  . LEFT HEART CATH AND CORONARY ANGIOGRAPHY N/A 09/16/2019   Procedure: LEFT HEART CATH AND CORONARY ANGIOGRAPHY;  Surgeon: Yvonne Kendall, MD;  Location: [facility] INVASIVE CV LAB;  Service: Cardiovascular;  Laterality: N/A;  . LOOP RECORDER INSERTION N/A 02/21/2020   Procedure: LOOP RECORDER INSERTION;  Surgeon: Duke Salvia, MD;  Location: [facility] INVASIVE CV LAB;  Service: Cardiovascular;  Laterality: N/A;  . OOPHORECTOMY  1970  . SPINE SURGERY  03/31/2006  . TOTAL KNEE ARTHROPLASTY Left 05/17/2016  Procedure: LEFT TOTAL KNEE ARTHROPLASTY;  Surgeon: Kathryne Hitch, MD;  Location: [facility] ORS;  Service: Orthopedics;  Laterality: Left;  Adductor Block; Spinal to General  . TUBAL LIGATION  1983    FAMHx   Family History  Problem Relation Age of Onset  . Other Father        kidney problems    SOCHx    reports that she quit smoking about 46 years ago. Her smoking use included cigarettes. She has a  10.00 pack-year smoking history. She has never used smokeless tobacco. She reports that she does not drink alcohol and does not use drugs.  Outpatient Medications   No current facility-administered medications on file prior to encounter.   Current Outpatient Medications on File Prior to Encounter  Medication Sig Dispense Refill  . acetaminophen (TYLENOL) 325 MG tablet Take 2 tablets (650 mg total) by mouth every 4 (four) hours as needed for mild pain (or temp > 37.5 C (99.5 F)).    Marland Kitchen amLODipine (NORVASC) 10 MG tablet Take 10 mg by mouth daily.    Marland Kitchen artificial tears (LACRILUBE) OINT ophthalmic ointment Place into the left eye at bedtime. Apply left eye bedtime 1 Tube 1  . aspirin EC 81 MG EC tablet Take 1 tablet (81 mg total) by mouth daily. 90 tablet 3  . atorvastatin (LIPITOR) 20 MG tablet Take 1 tablet (20 mg total) by mouth at bedtime. 30 tablet 0  . busPIRone (BUSPAR) 30 MG tablet Take 1 tablet (30 mg total) by mouth 2 (two) times daily. 180 tablet 0  . cholecalciferol (VITAMIN D3) 25 MCG (1000 UNIT) tablet Take 1 tablet (1,000 Units total) by mouth daily. 30 tablet 0  . clopidogrel (PLAVIX) 75 MG tablet Take 1 tablet (75 mg total) by mouth daily. 30 tablet 0  . cycloSPORINE (RESTASIS) 0.05 % ophthalmic emulsion Place 1 drop into both eyes daily as needed (dry eyes). 0.4 mL   . diphenhydramine-acetaminophen (TYLENOL PM) 25-500 MG TABS tablet Take 1 tablet by mouth at bedtime as needed.    . donepezil (ARICEPT) 10 MG tablet Take 1 tablet (10 mg total) by mouth at bedtime. 30 tablet 0  . lisinopril (ZESTRIL) 5 MG tablet Take 1 tablet (5 mg total) by mouth daily. 30 tablet 0  . melatonin 3 MG TABS tablet Take 1 tablet (3 mg total) by mouth at bedtime. 30 tablet 0  . memantine (NAMENDA XR) 28 MG CP24 24 hr capsule Take 1 capsule (28 mg total) by mouth daily. 30 capsule 0  . mirtazapine (REMERON) 15 MG tablet Take 1 tablet (15 mg total) by mouth as needed (at bedtime). 90 tablet 0  . Multiple  Vitamins-Minerals (MULTI-VITAMIN GUMMIES PO) Take 1 each by mouth daily.     . nitroGLYCERIN (NITROSTAT) 0.4 MG SL tablet Place 1 tablet (0.4 mg total) under the tongue every 5 (five) minutes as needed for chest pain. 25 tablet 2  . oxyCODONE (ROXICODONE) 15 MG immediate release tablet Take 1 tablet (15 mg total) by mouth 4 (four) times daily as needed (chronic pain). 30 tablet 0  . oxyCODONE (ROXICODONE) 15 MG immediate release tablet Take 1 tablet (15 mg total) by mouth 4 (four) times daily as needed (chronic pain). 30 tablet 0  . polyethylene glycol (MIRALAX / GLYCOLAX) 17 g packet Take 17 g by mouth 2 (two) times daily. 14 each 0  . polyvinyl alcohol (LIQUIFILM TEARS) 1.4 % ophthalmic solution Place 2 drops into the left eye 4 (four) times  daily. 15 mL 0    Inpatient Medications    Scheduled Meds: . aspirin  324 mg Oral Once  . enoxaparin (LOVENOX) injection  1 mg/kg Subcutaneous Once    Continuous Infusions:   PRN Meds:    ALLERGIES   Allergies  Allergen Reactions  . T-Pa [Alteplase] Swelling    Lip swelling immediately post tPA  . Codeine Itching    ROS   Pertinent items noted in HPI and remainder of comprehensive ROS otherwise negative.  Vitals   Vitals:   05/05/20 1900 05/05/20 1915 05/05/20 1930 05/05/20 1945  BP: 110/77 104/81 110/79 113/78  Pulse: 69 67 66 67  Resp: Temp:      TempSrc:      SpO2: 97% 98% 97% 98%  Weight:    89.9 kg    Intake/Output Summary (Last 24 hours) at 05/05/2020 2008 Last data filed at 05/05/2020 1715 Gross per 24 hour  Intake 500.33 ml  Output --  Net 500.33 ml   Filed Weights   05/05/20 1945  Weight: 89.9 kg    Physical Exam   General appearance: Appears somnolent, drifting off to sleep but arousable to stimuli Neck: no carotid bruit, no JVD and thyroid not enlarged, symmetric, no tenderness/mass/nodules Lungs: diminished breath sounds bilaterally Heart: regular rate and rhythm Abdomen: soft, non-tender;  bowel sounds normal; no masses,  no organomegaly and Obese Extremities: edema Trace to 1+ bilateral pedal edema Pulses: 2+ and symmetric Skin: Skin color, texture, turgor normal. No rashes or lesions Neurologic: Mental status: Somnolent, drifts off but eventually was arousable, able to follow commands but not speaking in sentences.  No gross focal deficits Psych: Somnolent/mildly obtunded  Labs   Results for orders placed or performed during the hospital encounter of 05/05/20 (from the past 48 hour(s))  Basic metabolic panel     Status: Abnormal   Collection Time: 05/05/20  2:29 PM  Result Value Ref Range   Sodium 145 135 - 145 mmol/L   Potassium 3.2 (L) 3.5 - 5.1 mmol/L   Chloride 109 98 - 111 mmol/L   CO2 25 22 - 32 mmol/L   Glucose, Bld 161 (H) 70 - 99 mg/dL    Comment: Glucose reference range applies only to samples taken after fasting for at least 8 hours.   BUN 9 8 - 23 mg/dL   Creatinine, Ser 0.96 (H) 0.44 - 1.00 mg/dL   Calcium 8.9 8.9 - 04.5 mg/dL   GFR calc non Af Amer 46 (L) >60 mL/min   GFR calc Af Amer 54 (L) >60 mL/min   Anion gap 11 5 - 15    Comment: Performed at [facility], [address] [place], [place] [address]  CBC     Status: Abnormal   Collection Time: 05/05/20  2:29 PM  Result Value Ref Range   WBC 9.8 4.0 - 10.5 K/uL   RBC 3.80 (L) 3.87 - 5.11 MIL/uL   Hemoglobin 11.9 (L) 12.0 - 15.0 g/dL   HCT 19.1 36 - 46 %   MCV 101.8 (H) 80.0 - 100.0 fL   MCH 31.3 26.0 - 34.0 pg   MCHC 30.7 30.0 - 36.0 g/dL   RDW 47.8 29.5 - 62.1 %   Platelets 154 150 - 400 K/uL   nRBC 0.2 0.0 - 0.2 %    Comment: Performed at [facility], [address] [place], [place] [address]  CBG monitoring, ED     Status:  Abnormal   Collection Time: 05/05/20  2:30 PM  Result Value Ref Range   Glucose-Capillary 159 (H) 70 - 99 mg/dL    Comment: Glucose reference range applies only to samples taken after fasting for at least 8 hours.  Respiratory Panel by RT PCR (Flu A&B,  Covid) - Nasopharyngeal Swab     Status: None   Collection Time: 05/05/20  4:18 PM   Specimen: Nasopharyngeal Swab  Result Value Ref Range   SARS Coronavirus 2 by RT PCR NEGATIVE NEGATIVE    Comment: (NOTE) SARS-CoV-2 target nucleic acids are NOT DETECTED.  The SARS-CoV-2 RNA is generally detectable in upper respiratoy specimens during the acute phase of infection. The lowest concentration of SARS-CoV-2 viral copies this assay can detect is 131 copies/mL. A negative result does not preclude SARS-Cov-2 infection and should not be used as the sole basis for treatment or other patient management decisions. A negative result may occur with  improper specimen collection/handling, submission of specimen other than nasopharyngeal swab, presence of viral mutation(s) within the areas targeted by this assay, and inadequate number of viral copies (<131 copies/mL). A negative result must be combined with clinical observations, patient history, and epidemiological information. The expected result is Negative.  Fact Sheet for Patients:  https://www.moore.com/  Fact Sheet for Healthcare Providers:  https://www.young.biz/  This test is no t yet approved or cleared by the [place] FDA and  has been authorized for detection and/or diagnosis of SARS-CoV-2 by FDA under an Emergency Use Authorization (EUA). This EUA will remain  in effect (meaning this test can be used) for the duration of the COVID-19 declaration under Section 564(b)(1) of the Act, 21 U.S.C. section 360bbb-3(b)(1), unless the authorization is terminated or revoked sooner.     Influenza A by PCR NEGATIVE NEGATIVE   Influenza B by PCR NEGATIVE NEGATIVE    Comment: (NOTE) The Xpert Xpress SARS-CoV-2/FLU/RSV assay is intended as an aid in  the diagnosis of influenza from Nasopharyngeal swab specimens and  should not be used as a sole basis for treatment. Nasal washings and  aspirates are  unacceptable for Xpert Xpress SARS-CoV-2/FLU/RSV  testing.  Fact Sheet for Patients: https://www.moore.com/  Fact Sheet for Healthcare Providers: https://www.young.biz/  This test is not yet approved or cleared by the [place] FDA and  has been authorized for detection and/or diagnosis of SARS-CoV-2 by  FDA under an Emergency Use Authorization (EUA). This EUA will remain  in effect (meaning this test can be used) for the duration of the  Covid-19 declaration under Section 564(b)(1) of the Act, 21  U.S.C. section 360bbb-3(b)(1), unless the authorization is  terminated or revoked. Performed at [facility], [address] [place], [place] [address]   D-dimer, quantitative (not at [facility])     Status: Abnormal   Collection Time: 05/05/20  4:28 PM  Result Value Ref Range   D-Dimer, Quant >20.00 (H) 0.00 - 0.50 ug/mL-FEU    Comment: (NOTE) At the manufacturer cut-off of 0.50 ug/mL FEU, this assay has been documented to exclude PE with a sensitivity and negative predictive value of 97 to 99%.  At this time, this assay has not been approved by the FDA to exclude DVT/VTE. Results should be correlated with clinical presentation. Performed at [facility], [address] [place], [place] [address]   Troponin I (High Sensitivity)     Status: Abnormal   Collection Time: 05/05/20  6:25 PM  Result Value Ref Range   Troponin I (  High Sensitivity) 640 (HH) <18 ng/L    Comment: CRITICAL RESULT CALLED TO, READ BACK BY AND VERIFIED WITH: J.FERRAINOLO RN 1930 05/05/20 MCCORMICK K (NOTE) Elevated high sensitivity troponin I (hsTnI) values and significant  changes across serial measurements may suggest ACS but many other  chronic and acute conditions are known to elevate hsTnI results.  Refer to the Links section for chest pain algorithms and additional  guidance. Performed at [facility], [address], [place], [place] [address]      ECG   Sinus rhythm with first-degree AV block, no ischemic changes, PVC- Personally Reviewed  Telemetry   Sinus rhythm- Personally Reviewed  Radiology   DG Chest Portable 1 View  Result Date: 05/05/2020 CLINICAL DATA:  Near syncope EXAM: PORTABLE CHEST 1 VIEW COMPARISON:  November 07, 2019 FINDINGS: The heart size is enlarged. Aortic calcifications are noted. The pulmonary arteries appear to be dilated which can be seen in patients with elevated pulmonary artery pressures. There is no pneumothorax or large pleural effusion. There is a device projecting over the patient's left ventricle which may represent a loop recorder or lead less pacemaker. There is no acute displaced fracture. There are degenerative changes of both glenohumeral joints. IMPRESSION: No active disease. Electronically Signed   By: Katherine Mantle M.D.   On: 05/05/2020 16:56    Cardiac Studies   None  Impression   Principal Problem:   Elevated troponin Active Problems:   Takotsubo cardiomyopathy   History of DVT (deep vein thrombosis)   Dementia (HCC)   Recommendation   1. Ms. Cashin presents with a near syncopal episode and has had some altered mental status consistent with delirium.  There is history of dementia on her problem list however at times according to the ER staff she was quite lucid and conversive.  She was not this way when I saw her.  I could not elicit focal deficits.  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  Her troponin is elevated but was previously in February 2021, cath at the time showed minimal luminal irregularities and it was felt that her LVEF of 30 to 35% represented a Takotsubo cardiomyopathy.  Subsequently LVEF had improved.  Chest x-ray now shows cardiomegaly and I suspect she again has cardiomyopathy.  We will plan a repeat echo.  Per ER she will be dosed with Lovenox.  D-dimer is noted to be quite high >20 and she will likely need rule out for pulmonary embolus given  her history of DVT and PE.  I do not suspect acute coronary syndrome given her essentially angiographically normal coronaries by cath earlier this year.  Cardiology will follow with you.  Thanks for the consultation.  Time Spent Directly with Patient:  I have spent a total of 45 minutes with the patient reviewing hospital notes, telemetry, EKGs, labs and examining the patient as well as establishing an assessment and plan that was discussed personally with the patient.  > 50% of time was spent in direct patient care.  Length of Stay:  LOS: 0 days   Chrystie Nose, MD, [facility], FACP  [place]  [facility]  Medical Director of the [facility] Lipid [facility]  Cardiovascular Risk Reduction Clinic Diplomate of the American Board of Clinical Lipidology Attending Cardiologist  Direct Dial: [phone]  Fax: [phone]  Website:  [email or web address] Nicely Crispin Vogel 05/05/2020, 8:08 PM

## 2020-05-05 NOTE — ED Notes (Signed)
Per ICU doctor to hold heparin, Lovenox, and aspirin due to the possibility to give TPA.

## 2020-05-05 NOTE — ED Triage Notes (Signed)
Pt arrives via [facility] from home where she was getting bathed by family when she became generally weak, eyes rolled back but patient never lost consciousness, a/ox4, able to stand with assistance. R sided facial droop at baseline from CVA in July. EMS vs 132/80, HR 100, cbg 195, 86% on room air, 95% on 4L. Denies O2 use at home. Ems stroke screen negative.

## 2020-05-05 NOTE — ED Notes (Signed)
Attempted to call report. They will call me back.

## 2020-05-05 NOTE — ED Provider Notes (Addendum)
[facility] EMERGENCY DEPARTMENT Provider Note   CSN: [id number] Arrival date & time: 05/05/20  1423     History Chief Complaint  Patient presents with  . Near Syncope    [name] is a 79 y.o. female.  Episode of near syncope, patient was feeling fine, going back to her normal day, her family took her to take a shower, she come to the hot shower felt fatigued, felt clammy, felt like she might pass out got short of breath.  Denies chest pain, denies recent fevers chills.  Has been eating and drinking normally.  Normal bowel movements normal urination, no other symptoms.  Symptoms completely resolved.  EMS brought her here with report of hypoxia however she is on room air satting 100%.  Blood pressure is borderline low, chart review of previous blood pressure shows her blood pressure is not particularly high clinic visits.         Past Medical History:  Diagnosis Date  . Arthritis   . Asthma   . Chronic back pain   . Depression   . History of pulmonary embolus (PE)   . Hx-TIA (transient ischemic attack)   . Hyperlipemia   . Hypertension   . Personal history of DVT (deep vein thrombosis)   . Scoliosis   . Sleep apnea    cpap - settingsi at 3   . 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 Elevated troponin 05/05/2020  . Macrocytic anemia   . Hypokalemia   . Labile blood pressure   . Drug-induced hypotension   . History of hypertension   . First degree AV block   . Sleep disturbance   . Drug induced constipation   . Stage 2 chronic kidney disease   . Dysphagia, post-stroke   . Benign essential HTN   . Slow transit constipation   . 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 Allergic reaction caused by a drug (tPA w/ lip swelling) 02/21/2020  . 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 Dementia (HCC) 02/21/2020  . Bradycardia 02/21/2020  . AKI (acute kidney injury)  (HCC) 02/21/2020  . 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 Dyslipidemia   . History of DVT (deep vein thrombosis)   . OSA (obstructive sleep apnea)   . Leukocytosis   . Stroke [facility]) L>R pontine and L cerebellar embolic infarcts, source unknown s/p tPA 02/16/2020  . Hx-TIA (transient ischemic attack)   . Hyperlipemia   . Hypertension   . Sleep apnea   . History of pulmonary embolus (PE)   . NSTEMI (non-ST elevated myocardial infarction) (HCC) 09/16/2019  . 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 Depression 07/29/2018  . Acute pulmonary embolism (HCC) 09/29/2016  . Acute encephalopathy 09/28/2016  . Left knee pain 09/28/2016  . Left leg swelling 09/28/2016  . Hilar mass 09/28/2016  . OSA on CPAP 09/28/2016  . HTN (hypertension) 09/28/2016  . HLD (hyperlipidemia) 09/28/2016  . Chronic pain 09/28/2016  . Cellulitis of left leg 06/14/2016  . Osteoarthritis of left knee 05/17/2016  . 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 ARTHROSCOPIC REPAIR ACL  02/03/2004  . LEFT HEART CATH AND CORONARY ANGIOGRAPHY N/A 09/16/2019   Procedure: LEFT HEART CATH AND CORONARY ANGIOGRAPHY;  Surgeon: Yvonne Kendall, MD;  Location: [facility] INVASIVE CV LAB;  Service: Cardiovascular;  Laterality: N/A;  .  LOOP RECORDER INSERTION N/A 02/21/2020   Procedure: LOOP RECORDER INSERTION;  Surgeon: Duke Salvia, MD;  Location: [facility] INVASIVE CV LAB;  Service: Cardiovascular;  Laterality: N/A;  . OOPHORECTOMY  1970  . SPINE SURGERY  03/31/2006  . TOTAL KNEE ARTHROPLASTY Left 05/17/2016   Procedure: LEFT TOTAL KNEE ARTHROPLASTY;  Surgeon: Kathryne Hitch, MD;  Location: [facility] ORS;  Service: Orthopedics;  Laterality: Left;  Adductor Block; Spinal to General  . TUBAL LIGATION  1983     OB History   No obstetric history on file.     Family History  Problem Relation Age of Onset  . Other Father        kidney problems    Social  History   Tobacco Use  . Smoking status: Former Smoker    Packs/day: 1.00    Years: 10.00    Pack years: 10.00    Types: Cigarettes    Quit date: 08/12/1973    Years since quitting: 46.7  . Smokeless tobacco: Never Used  Substance Use Topics  . Alcohol use: No  . Drug use: No    Home Medications Prior to Admission medications   Medication Sig Start Date End Date Taking? Authorizing Provider  acetaminophen (TYLENOL) 325 MG tablet Take 2 tablets (650 mg total) by mouth every 4 (four) hours as needed for mild pain (or temp > 37.5 C (99.5 F)). 02/21/20   Layne Benton, NP  amLODipine (NORVASC) 10 MG tablet Take 10 mg by mouth daily. 03/02/20   [provider]  artificial tears (LACRILUBE) OINT ophthalmic ointment Place into the left eye at bedtime. Apply left eye bedtime 03/10/20   Angiulli, Mcarthur Rossetti, PA-C  aspirin EC 81 MG EC tablet Take 1 tablet (81 mg total) by mouth daily. 09/18/19   Duke, Roe Rutherford, PA  atorvastatin (LIPITOR) 20 MG tablet Take 1 tablet (20 mg total) by mouth at bedtime. 03/09/20   Angiulli, Mcarthur Rossetti, PA-C  busPIRone (BUSPAR) 30 MG tablet Take 1 tablet (30 mg total) by mouth 2 (two) times daily. 04/11/20   Cherie Ouch, PA-C  cholecalciferol (VITAMIN D3) 25 MCG (1000 UNIT) tablet Take 1 tablet (1,000 Units total) by mouth daily. 03/09/20   Angiulli, Mcarthur Rossetti, PA-C  clopidogrel (PLAVIX) 75 MG tablet Take 1 tablet (75 mg total) by mouth daily. 03/09/20   Angiulli, Mcarthur Rossetti, PA-C  cycloSPORINE (RESTASIS) 0.05 % ophthalmic emulsion Place 1 drop into both eyes daily as needed (dry eyes). 02/21/20   Layne Benton, NP  diphenhydramine-acetaminophen (TYLENOL PM) 25-500 MG TABS tablet Take 1 tablet by mouth at bedtime as needed.    [provider]  donepezil (ARICEPT) 10 MG tablet Take 1 tablet (10 mg total) by mouth at bedtime. 03/09/20   Angiulli, Mcarthur Rossetti, PA-C  lisinopril (ZESTRIL) 5 MG tablet Take 1 tablet (5 mg total) by mouth daily. 03/10/20   Angiulli,  Mcarthur Rossetti, PA-C  melatonin 3 MG TABS tablet Take 1 tablet (3 mg total) by mouth at bedtime. 03/09/20   Angiulli, Mcarthur Rossetti, PA-C  memantine (NAMENDA XR) 28 MG CP24 24 hr capsule Take 1 capsule (28 mg total) by mouth daily. 03/09/20   Angiulli, Mcarthur Rossetti, PA-C  mirtazapine (REMERON) 15 MG tablet Take 1 tablet (15 mg total) by mouth as needed (at bedtime). 04/11/20   Melony Overly T, PA-C  Multiple Vitamins-Minerals (MULTI-VITAMIN GUMMIES PO) Take 1 each by mouth daily.     [provider]  nitroGLYCERIN (NITROSTAT) 0.4 MG  SL tablet Place 1 tablet (0.4 mg total) under the tongue every 5 (five) minutes as needed for chest pain. 09/17/19   Duke, Roe Rutherford, PA  oxyCODONE (ROXICODONE) 15 MG immediate release tablet Take 1 tablet (15 mg total) by mouth 4 (four) times daily as needed (chronic pain). 02/21/20   Layne Benton, NP  oxyCODONE (ROXICODONE) 15 MG immediate release tablet Take 1 tablet (15 mg total) by mouth 4 (four) times daily as needed (chronic pain). 03/09/20   Angiulli, Mcarthur Rossetti, PA-C  polyethylene glycol (MIRALAX / GLYCOLAX) 17 g packet Take 17 g by mouth 2 (two) times daily. 03/09/20   Angiulli, Mcarthur Rossetti, PA-C  polyvinyl alcohol (LIQUIFILM TEARS) 1.4 % ophthalmic solution Place 2 drops into the left eye 4 (four) times daily. 02/21/20   Layne Benton, NP    Allergies    T-pa [alteplase] and Codeine  Review of Systems   Review of Systems  Constitutional: Negative for chills and fever.  HENT: Negative for congestion and rhinorrhea.   Respiratory: Positive for shortness of breath (resolved). Negative for cough.   Cardiovascular: Positive for leg swelling (chronic). Negative for chest pain and palpitations.  Gastrointestinal: Negative for diarrhea, nausea and vomiting.  Genitourinary: Negative for difficulty urinating and dysuria.  Musculoskeletal: Negative for arthralgias and back pain.  Skin: Negative for rash and wound.  Neurological: Positive for light-headedness. Negative for  headaches.    Physical Exam Updated Vital Signs BP 113/78   Pulse 67   Temp 97.7 F (36.5 C) (Oral)   Resp 13   Wt 89.9 kg   SpO2 98%   BMI 36.25 kg/m   Physical Exam Vitals and nursing note reviewed. Exam conducted with a chaperone present.  Constitutional:      General: She is not in acute distress.    Appearance: Normal appearance.  HENT:     Head: Normocephalic and atraumatic.     Nose: No rhinorrhea.  Eyes:     General:        Right eye: No discharge.        Left eye: No discharge.     Conjunctiva/sclera: Conjunctivae normal.  Cardiovascular:     Rate and Rhythm: Normal rate and regular rhythm.     Heart sounds: No murmur heard.  No gallop.   Pulmonary:     Effort: Pulmonary effort is normal. No respiratory distress.     Breath sounds: No stridor.  Abdominal:     General: Abdomen is flat. There is no distension.     Palpations: Abdomen is soft.     Tenderness: There is no abdominal tenderness.  Musculoskeletal:        General: No tenderness or signs of injury.     Right lower leg: Edema (mild) present.     Left lower leg: Edema (mild) present.  Skin:    General: Skin is warm and dry.  Neurological:     General: No focal deficit present.     Mental Status: She is alert. Mental status is at baseline.     Motor: No weakness.     Comments: Facial droop on the patient's left, motor function intact of all extremities, sensation intact throughout, alert and oriented  Psychiatric:        Mood and Affect: Mood normal.        Behavior: Behavior normal.     ED Results / Procedures / Treatments   Labs (all labs ordered are listed, but only abnormal results are displayed)  Labs Reviewed  BASIC METABOLIC PANEL - Abnormal; Notable for the following components:      Result Value   Potassium 3.2 (*)    Glucose, Bld 161 (*)    Creatinine, Ser 1.13 (*)    GFR calc non Af Amer 46 (*)    GFR calc Af Amer 54 (*)    All other components within normal limits  CBC -  Abnormal; Notable for the following components:   RBC 3.80 (*)    Hemoglobin 11.9 (*)    MCV 101.8 (*)    All other components within normal limits  D-DIMER, QUANTITATIVE (NOT AT [facility]) - Abnormal; Notable for the following components:   D-Dimer, Quant >20.00 (*)    All other components within normal limits  CBG MONITORING, ED - Abnormal; Notable for the following components:   Glucose-Capillary 159 (*)    All other components within normal limits  TROPONIN I (HIGH SENSITIVITY) - Abnormal; Notable for the following components:   Troponin I (High Sensitivity) 640 (*)    All other components within normal limits  RESPIRATORY PANEL BY RT PCR (FLU A&B, COVID)  URINALYSIS, ROUTINE W REFLEX MICROSCOPIC  URINALYSIS, ROUTINE W REFLEX MICROSCOPIC  I-STAT VENOUS BLOOD GAS, ED    EKG EKG Interpretation  Date/Time:  Friday May 05 2020 14:31:08 EDT Ventricular Rate:  88 PR Interval:  240 QRS Duration: 98 QT Interval:  354 QTC Calculation: 428 R Axis:   -65 Text Interpretation: Sinus rhythm with 1st degree A-V block with Premature atrial complexes with Abberant conduction Left axis deviation Nonspecific T wave abnormality Abnormal ECG Confirmed by Cherlynn Perches ([id number]) on 05/05/2020 5:13:15 PM   Radiology DG Chest Portable 1 View  Result Date: 05/05/2020 CLINICAL DATA:  Near syncope EXAM: PORTABLE CHEST 1 VIEW COMPARISON:  November 07, 2019 FINDINGS: The heart size is enlarged. Aortic calcifications are noted. The pulmonary arteries appear to be dilated which can be seen in patients with elevated pulmonary artery pressures. There is no pneumothorax or large pleural effusion. There is a device projecting over the patient's left ventricle which may represent a loop recorder or lead less pacemaker. There is no acute displaced fracture. There are degenerative changes of both glenohumeral joints. IMPRESSION: No active disease. Electronically Signed   By: Katherine Mantle M.D.   On: 05/05/2020 16:56     Procedures Ultrasound ED Peripheral IV (Provider)  Date/Time: 05/05/2020 8:12 PM Performed by: Sabino Donovan, MD Authorized by: Sabino Donovan, MD   Procedure details:    Indications: multiple failed IV attempts     Skin Prep: chlorhexidine gluconate     Location:  Left AC   Angiocath:  18 G   Bedside Ultrasound Guided: Yes     Images: archived     Patient tolerated procedure without complications: Yes     Dressing applied: Yes    .Critical Care Performed by: Sabino Donovan, MD Authorized by: Sabino Donovan, MD   Critical care provider statement:    Critical care time (minutes):  45   Critical care was necessary to treat or prevent imminent or life-threatening deterioration of the following conditions:  Circulatory failure   Critical care was time spent personally by me on the following activities:  Discussions with consultants, evaluation of patient's response to treatment, examination of patient, ordering and performing treatments and interventions, ordering and review of laboratory studies, ordering and review of radiographic studies, pulse oximetry, re-evaluation of patient's condition, obtaining history from patient or surrogate and  review of old charts   (including critical care time)  Medications Ordered in ED Medications  aspirin chewable tablet 324 mg (has no administration in time range)  enoxaparin (LOVENOX) injection 90 mg (has no administration in time range)  lactated ringers bolus 500 mL (0 mLs Intravenous Stopped 05/05/20 1715)    ED Course  I have reviewed the triage vital signs and the nursing notes.  Pertinent labs & imaging results that were available during my care of the patient were reviewed by me and considered in my medical decision making (see chart for details).  Clinical Course as of May 06 2135  Fri May 05, 2020  2108 Took call from Radiologist: reports CTA shows  Massive saddle PE, involving all major segments with significant R heart strain, RV:LV  ratio almost 3 with reflux.   [JR]    Clinical Course User Index [JR] Robinson, [place] N, PA-C   MDM Rules/Calculators/A&P                          80 year old female had what sounds like a vasovagal near syncopal episode when she got into a hot shower.  Otherwise been doing fine is now back to baseline.  She denies chest pain, does not feel that she ever did lose consciousness.  Is on dual antiplatelet therapy.  Previous history of PE, will get dimer.  Will get troponin, will get chemistry chest x-ray EKG, Covid test pending possible admission for syncope.  Medtronic LINQ Reveal Loop Recorder  Serial Number  [id number] was implanted in July, we will touch base with cardiology to see about getting this interrogated.  Cardiology states that there is patient interrogator should be easily interrogated with her typical interrogation devices, the nurses have done so.  Her D-dimer is incalculable is high.  He will get a CT PE scan.  She is intermittently requiring more oxygen. Currently on 2L   Troponin is elevated at 600, chart review and consultation with cardiology shows presentation for chest pain back in February where she had stress-induced cardiomyopathy with decreased EF, but then recovered.  Cardiologist feels strongly that that is what this is, there was minimal coronary artery disease at that time.  Patient will get aspirin and Lovenox, as there also may be the potential of NSTEMI or PE.  The patient will require admission to the hospital service.  I gave the son an update, that I was brought to bedside as the patient was more somnolent, she was aroused with verbal stimuli and was able to still answer questions and follow commands, I did have to get ultrasound-guided IV as described above to get a CT PE study.  The hospitalist team agreed to admit the patient.  We will get a VBG due to the increased somnolence, but may just be in the setting of her cardiomyopathy.  Also get a CT can of the head  to make sure it is safe to give anticoagulant no significant electrolyte derangements.  No signs of infection.  Covid test is negative.  CT PE study shows massive PE with right heart strain.  This patient will likely need stepdown unit or critical care, critical care team is consulted, the medicine team will start heparin infusion  The patient will be admitted to the hospitalist.  For the remainder this patient's care please see inpatient team notes.  I will intervene as needed while the patient remains in the emergency department.   CRITICAL CARE Performed by:  Sabino Donovan   Total critical care time: 45 minutes  Critical care time was exclusive of separately billable procedures and treating other patients.  Critical care was necessary to treat or prevent imminent or life-threatening deterioration.  Critical care was time spent personally by me on the following activities: development of treatment plan with patient and/or surrogate as well as nursing, discussions with consultants, evaluation of patient's response to treatment, examination of patient, obtaining history from patient or surrogate, ordering and performing treatments and interventions, ordering and review of laboratory studies, ordering and review of radiographic studies, pulse oximetry and re-evaluation of patient's condition.   Final Clinical Impression(s) / ED Diagnoses Final diagnoses:  Elevated troponin  Near syncope    Rx / DC Orders ED Discharge Orders    None       Sabino Donovan, MD 05/05/20 2013    Sabino Donovan, MD 05/05/20 2014    Sabino Donovan, MD 05/05/20 2147

## 2020-05-05 NOTE — ED Notes (Signed)
Loop recorder was interrogated with medtronic. MD made aware that results were sent.

## 2020-05-06 ENCOUNTER — Inpatient Hospital Stay (HOSPITAL_COMMUNITY): Payer: Medicare Other

## 2020-05-06 DIAGNOSIS — R609 Edema, unspecified: Secondary | ICD-10-CM

## 2020-05-06 DIAGNOSIS — I361 Nonrheumatic tricuspid (valve) insufficiency: Secondary | ICD-10-CM

## 2020-05-06 DIAGNOSIS — I2699 Other pulmonary embolism without acute cor pulmonale: Secondary | ICD-10-CM

## 2020-05-06 DIAGNOSIS — I351 Nonrheumatic aortic (valve) insufficiency: Secondary | ICD-10-CM

## 2020-05-06 LAB — PHOSPHORUS: Phosphorus: 3.9 mg/dL (ref 2.5–4.6)

## 2020-05-06 LAB — GLUCOSE, CAPILLARY
Glucose-Capillary: 100 mg/dL — ABNORMAL HIGH (ref 70–99)
Glucose-Capillary: 101 mg/dL — ABNORMAL HIGH (ref 70–99)
Glucose-Capillary: 103 mg/dL — ABNORMAL HIGH (ref 70–99)
Glucose-Capillary: 108 mg/dL — ABNORMAL HIGH (ref 70–99)
Glucose-Capillary: 109 mg/dL — ABNORMAL HIGH (ref 70–99)
Glucose-Capillary: 116 mg/dL — ABNORMAL HIGH (ref 70–99)
Glucose-Capillary: 83 mg/dL (ref 70–99)

## 2020-05-06 LAB — MRSA PCR SCREENING: MRSA by PCR: NEGATIVE

## 2020-05-06 LAB — HEPARIN LEVEL (UNFRACTIONATED)
Heparin Unfractionated: <0.1 IU/mL — ABNORMAL LOW (ref 0.30–0.70)
Heparin Unfractionated: 0.31 IU/mL (ref 0.30–0.70)
Heparin Unfractionated: 0.36 IU/mL (ref 0.30–0.70)

## 2020-05-06 LAB — CBC
HCT: 39.3 % (ref 36.0–46.0)
Hemoglobin: 12.2 g/dL (ref 12.0–15.0)
MCH: 31 pg (ref 26.0–34.0)
MCHC: 31 g/dL (ref 30.0–36.0)
MCV: 100 fL (ref 80.0–100.0)
Platelets: 142 K/uL — ABNORMAL LOW (ref 150–400)
RBC: 3.93 MIL/uL (ref 3.87–5.11)
RDW: 13 % (ref 11.5–15.5)
WBC: 10.8 K/uL — ABNORMAL HIGH (ref 4.0–10.5)
nRBC: 0 % (ref 0.0–0.2)

## 2020-05-06 LAB — ECHOCARDIOGRAM COMPLETE
Area-P 1/2: 1.87 cm2
Height: 62 in
P 1/2 time: 688 ms
S' Lateral: 2.4 cm
Weight: 2938.29 [oz_av]

## 2020-05-06 LAB — BASIC METABOLIC PANEL
Anion gap: 13 (ref 5–15)
BUN: 10 mg/dL (ref 8–23)
CO2: 25 mmol/L (ref 22–32)
Calcium: 9 mg/dL (ref 8.9–10.3)
Chloride: 104 mmol/L (ref 98–111)
Creatinine, Ser: 0.83 mg/dL (ref 0.44–1.00)
GFR calc Af Amer: >60 mL/min (ref >60)
GFR calc non Af Amer: >60 mL/min (ref >60)
Glucose, Bld: 101 mg/dL — ABNORMAL HIGH (ref 70–99)
Potassium: 4.2 mmol/L (ref 3.5–5.1)
Sodium: 142 mmol/L (ref 135–145)

## 2020-05-06 LAB — MAGNESIUM: Magnesium: 2 mg/dL (ref 1.7–2.4)

## 2020-05-06 LAB — HEMOGLOBIN A1C
Hgb A1c MFr Bld: 5.2 % (ref 4.8–5.6)
Mean Plasma Glucose: 102.54 mg/dL

## 2020-05-06 LAB — BRAIN NATRIURETIC PEPTIDE: B Natriuretic Peptide: 175.9 pg/mL — ABNORMAL HIGH (ref 0.0–100.0)

## 2020-05-06 LAB — PROTIME-INR
INR: 1.6 — ABNORMAL HIGH (ref 0.8–1.2)
Prothrombin Time: 18.8 seconds — ABNORMAL HIGH (ref 11.4–15.2)

## 2020-05-06 LAB — APTT: aPTT: 45 seconds — ABNORMAL HIGH (ref 24–36)

## 2020-05-06 MED ORDER — HEPARIN (PORCINE) 25000 UT/250ML-% IV SOLN
800.0000 [IU]/h | INTRAVENOUS | Status: DC
  Administered 2020-05-06 – 2020-05-07 (×2): 850 [IU]/h via INTRAVENOUS
  Administered 2020-05-08: 800 [IU]/h via INTRAVENOUS
  Filled 2020-05-06 (×3): qty 250

## 2020-05-06 MED ORDER — SODIUM CHLORIDE 0.9 % IV BOLUS
500.0000 mL | Freq: Once | INTRAVENOUS | Status: AC
  Administered 2020-05-07: 500 mL via INTRAVENOUS

## 2020-05-06 MED ORDER — ACETAMINOPHEN 325 MG PO TABS
650.0000 mg | ORAL_TABLET | Freq: Four times a day (QID) | ORAL | Status: DC | PRN
  Administered 2020-05-07 (×2): 650 mg via ORAL
  Filled 2020-05-06 (×2): qty 2

## 2020-05-06 MED ORDER — ONDANSETRON HCL 4 MG/2ML IJ SOLN
4.0000 mg | Freq: Four times a day (QID) | INTRAMUSCULAR | Status: DC | PRN
  Administered 2020-05-07: 4 mg via INTRAVENOUS
  Filled 2020-05-06: qty 2

## 2020-05-06 MED ORDER — HYDROCODONE-ACETAMINOPHEN 5-325 MG PO TABS
1.0000 | ORAL_TABLET | Freq: Four times a day (QID) | ORAL | Status: AC | PRN
  Administered 2020-05-06: 1 via ORAL
  Administered 2020-05-07: 2 via ORAL
  Filled 2020-05-06: qty 2
  Filled 2020-05-06: qty 1

## 2020-05-06 MED ORDER — CHLORHEXIDINE GLUCONATE CLOTH 2 % EX PADS
6.0000 | MEDICATED_PAD | Freq: Every day | CUTANEOUS | Status: DC
  Administered 2020-05-06 – 2020-05-13 (×8): 6 via TOPICAL

## 2020-05-06 NOTE — Progress Notes (Signed)
Pt admitted today from [facility].  Pt placed on telemetry and CCMD notified.  CHG bath completed.  Pt is A&O X4 and neuro intact.  Pt has significant swelling in left arm due to previous allergic reaction to TPA treatment, MD's aware and monitoring.  Vitals taken and all within normal range. Pt currently comfortable.  Pt will continue to be monitored.

## 2020-05-06 NOTE — Progress Notes (Signed)
eLink Physician-Brief Progress Note Patient Name: [name] DOB: [birth year] MRN: [id number]   Date of Service  05/06/2020  HPI/Events of Note  Patient admitted with sub-massive PE and CT evidence of right heart strain.  eICU Interventions  New Patient Evaluation completed.        [name] 05/06/2020, 12:14 AM

## 2020-05-06 NOTE — Progress Notes (Signed)
ANTICOAGULATION CONSULT NOTE  Pharmacy Consult for Heparin  Indication: 9/24 Large BL saddle PE   Allergies  Allergen Reactions  . T-Pa [Alteplase] Swelling    Does okay with it if premedicated with benadryl.  . Codeine Itching    Patient Measurements: Height: 5\' 2"  (157.5 cm) Weight: 83.3 kg (183 lb 10.3 oz) IBW/kg (Calculated) : 50.1 Heparin Dosing Weight: 70.8 kg  Vital Signs: Temp: 97.3 F (36.3 C) (09/25 1218) Temp Source: Oral (09/25 0400) BP: 97/57 (09/25 1300) Pulse Rate: 65 (09/25 1300)  Labs: Recent Labs    05/05/20 1429 05/05/20 1429 05/05/20 1825 05/05/20 2018 05/06/20 0123 05/06/20 0126 05/06/20 1156  HGB 11.9*   < >  --  13.3 12.2  --   --   HCT 38.7  --   --  39.0 39.3  --   --   PLT 154  --   --   --  142*  --   --   APTT  --   --   --   --  45*  --   --   LABPROT  --   --   --   --  18.8*  --   --   INR  --   --   --   --  1.6*  --   --   HEPARINUNFRC  --   --   --   --   --  <0.10* 0.31  CREATININE 1.13*  --   --   --  0.83  --   --   TROPONINIHS  --   --  640*  --   --   --   --    < > = values in this interval not displayed.    Estimated Creatinine Clearance: 55 mL/min (by C-G formula based on SCr of 0.83 mg/dL).   Assessment: 79 yo W found to have large bilateral PEs with RV strain s/p low dose alteplase in ED 9/24.  Pharmacy consulted to dose heparin.   Initial heparin level is on lower end of therapeutic at 0.31 after heparin started at 0415 this AM. Patient had oozing from blood draw and IV sites this morning, but per discussion with nursing this has resolved. Patient is currently getting a new IV line placed. Given recent bleeding and new IV line, will continue current drip rate for now and recheck level tonight.  Goal of Therapy:  Heparin level 0.3-0.7 units/ml Monitor platelets by anticoagulation protocol: Yes   Plan:  Continue heparin infusion at 850 units/hr  F/u confirmatory 8-hr HL Monitor daily HL, CBC/plt Monitor for  signs/symptoms of bleeding    10/24, PharmD PGY2 Cardiology Pharmacy Resident Phone: [phone] 05/06/2020  1:23 PM  Please check [email or web address] for unit-specific pharmacy phone numbers.

## 2020-05-06 NOTE — Progress Notes (Signed)
ANTICOAGULATION CONSULT NOTE  Pharmacy Consult for Heparin  Indication: 9/24 Large BL saddle PE   Allergies  Allergen Reactions  . T-Pa [Alteplase] Swelling    Does okay with it if premedicated with benadryl.  . Codeine Itching    Patient Measurements: Height: 5\' 2"  (157.5 cm) Weight: 83.3 kg (183 lb 10.3 oz) IBW/kg (Calculated) : 50.1 Heparin Dosing Weight: 70.8 kg  Vital Signs: Temp: 98.1 F (36.7 C) (09/25 1849) Temp Source: Oral (09/25 1849) BP: 109/63 (09/25 1849) Pulse Rate: 72 (09/25 1849)  Labs: Recent Labs    05/05/20 1429 05/05/20 1429 05/05/20 1825 05/05/20 2018 05/06/20 0123 05/06/20 0126 05/06/20 1156 05/06/20 1927  HGB 11.9*   < >  --  13.3 12.2  --   --   --   HCT 38.7  --   --  39.0 39.3  --   --   --   PLT 154  --   --   --  142*  --   --   --   APTT  --   --   --   --  45*  --   --   --   LABPROT  --   --   --   --  18.8*  --   --   --   INR  --   --   --   --  1.6*  --   --   --   HEPARINUNFRC  --   --   --   --   --  <0.10* 0.31 0.36  CREATININE 1.13*  --   --   --  0.83  --   --   --   TROPONINIHS  --   --  640*  --   --   --   --   --    < > = values in this interval not displayed.    Estimated Creatinine Clearance: 55 mL/min (by C-G formula based on SCr of 0.83 mg/dL).   Assessment: 79 yo W found to have large bilateral PEs with RV strain s/p low dose alteplase in ED 9/24.  Pharmacy consulted to dose heparin.   Patient had oozing from blood draw and IV sites this morning- stable now. Heparin level came back therapeutic at 0.36, on 850 units/hr. Hgb 12.2, plt 142. No infusion issues.   Goal of Therapy:  Heparin level 0.3-0.7 units/ml Monitor platelets by anticoagulation protocol: Yes   Plan:  Continue heparin infusion at 850 units/hr  Monitor daily HL, CBC/plt Monitor for signs/symptoms of bleeding   10/24, PharmD, BCCCP Clinical Pharmacist  Phone: [phone] 05/06/2020 8:05 PM  Please check AMION for all [facility]  phone numbers After 10:00 PM, call Main Pharmacy [phone]

## 2020-05-06 NOTE — Progress Notes (Signed)
VASCULAR LAB    Bilateral lower extremity venous duplex has been performed.  See CV proc for preliminary results.  Messaged Dr. Katrinka Blazing with results.   Farrell Pantaleo, RVT 05/06/2020, 5:19 PM

## 2020-05-06 NOTE — Progress Notes (Signed)
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  Subsequent echocardiogram demonstrated normalization of LV function.  She now presents with near syncope getting a shower and troponin is again elevated.  Subjective   Noted to have submassive bilateral and saddle pulmonary emboli yesterday with right heart strain - the likely etiology of her elevated troponin. Given systemic alteplase yesterday - seems hemodynamically improved today. On heparin. Echo reviewed at the bedside - there appears to be normal LV systolic function with mild to moderate RV dysfunction - RVSP is around 41 mmHg with mild TR.  Her O2 sats are 100% on [place].  Objective   Vitals:   05/06/20 0215 05/06/20 0400 05/06/20 0500 05/06/20 0756  BP: (!) 156/107 123/67 123/76   Pulse: 66 63 68   Resp: Temp:  98 F (36.7 C)  (!) 97.4 F (36.3 C)  TempSrc:  Oral    SpO2: 95% 100% 97%   Weight:   83.3 kg   Height:        Intake/Output Summary (Last 24 hours) at 05/06/2020 0815 Last data filed at 05/06/2020 0100 Gross per 24 hour  Intake 500.33 ml  Output 450 ml  Net 50.33 ml   Filed Weights   05/05/20 1945 05/06/20 0500  Weight: 89.9 kg 83.3 kg    Physical Exam   General appearance: alert and no distress Neck: no carotid bruit, no JVD and thyroid not enlarged, symmetric, no tenderness/mass/nodules Lungs: clear to auscultation bilaterally Heart: regular rate and rhythm Abdomen: soft, non-tender; bowel sounds normal; no masses,  no organomegaly Extremities: extremities normal, atraumatic, no cyanosis or edema Pulses: 2+ and symmetric Skin: Skin  color, texture, turgor normal. No rashes or lesions Neurologic: Mental status: Alert, oriented, thought content appropriate, left facial droop Psych: Pleasant  Inpatient Medications    Scheduled Meds:  aspirin  324 mg Oral Once   busPIRone  30 mg Oral BID   Chlorhexidine Gluconate Cloth  6 each Topical Daily   donepezil  10 mg Oral QHS   insulin aspart  0-9 Units Subcutaneous Q4H   memantine  28 mg Oral Daily    Continuous Infusions:  sodium chloride     heparin 850 Units/hr (05/06/20 0413)   lactated ringers      PRN Meds: docusate sodium, polyethylene glycol   Labs   Results for orders placed or performed during the hospital encounter of 05/05/20 (from the past 48 hour(s))  Basic metabolic panel     Status: Abnormal   Collection Time: 05/05/20  2:29 PM  Result Value Ref Range   Sodium 145 135 - 145 mmol/L   Potassium 3.2 (L) 3.5 - 5.1 mmol/L   Chloride 109 98 - 111 mmol/L   CO2 25 22 - 32 mmol/L   Glucose, Bld 161 (H) 70 - 99 mg/dL    Comment: Glucose reference range applies only to samples taken after fasting for at least 8 hours.   BUN 9 8 - 23 mg/dL   Creatinine, Ser 1.61 (H) 0.44 - 1.00 mg/dL   Calcium 8.9 8.9 - 09.6 mg/dL   GFR calc non Af Amer 46 (L) >60 mL/min  GFR calc Af Amer 54 (L) >60 mL/min   Anion gap 11 5 - 15    Comment: Performed at [facility], [address] [place], [place] [address]  CBC     Status: Abnormal   Collection Time: 05/05/20  2:29 PM  Result Value Ref Range   WBC 9.8 4.0 - 10.5 K/uL   RBC 3.80 (L) 3.87 - 5.11 MIL/uL   Hemoglobin 11.9 (L) 12.0 - 15.0 g/dL   HCT 94.7 36 - 46 %   MCV 101.8 (H) 80.0 - 100.0 fL   MCH 31.3 26.0 - 34.0 pg   MCHC 30.7 30.0 - 36.0 g/dL   RDW 65.4 65.0 - 35.4 %   Platelets 154 150 - 400 K/uL   nRBC 0.2 0.0 - 0.2 %    Comment: Performed at [facility], [address] [place], [place] [address]  CBG monitoring, ED     Status: Abnormal   Collection Time: 05/05/20  2:30 PM  Result  Value Ref Range   Glucose-Capillary 159 (H) 70 - 99 mg/dL    Comment: Glucose reference range applies only to samples taken after fasting for at least 8 hours.  Respiratory Panel by RT PCR (Flu A&B, Covid) - Nasopharyngeal Swab     Status: None   Collection Time: 05/05/20  4:18 PM   Specimen: Nasopharyngeal Swab  Result Value Ref Range   SARS Coronavirus 2 by RT PCR NEGATIVE NEGATIVE    Comment: (NOTE) SARS-CoV-2 target nucleic acids are NOT DETECTED.  The SARS-CoV-2 RNA is generally detectable in upper respiratoy specimens during the acute phase of infection. The lowest concentration of SARS-CoV-2 viral copies this assay can detect is 131 copies/mL. A negative result does not preclude SARS-Cov-2 infection and should not be used as the sole basis for treatment or other patient management decisions. A negative result may occur with  improper specimen collection/handling, submission of specimen other than nasopharyngeal swab, presence of viral mutation(s) within the areas targeted by this assay, and inadequate number of viral copies (<131 copies/mL). A negative result must be combined with clinical observations, patient history, and epidemiological information. The expected result is Negative.  Fact Sheet for Patients:  https://www.moore.com/  Fact Sheet for Healthcare Providers:  https://www.young.biz/  This test is no t yet approved or cleared by the [place] FDA and  has been authorized for detection and/or diagnosis of SARS-CoV-2 by FDA under an Emergency Use Authorization (EUA). This EUA will remain  in effect (meaning this test can be used) for the duration of the COVID-19 declaration under Section 564(b)(1) of the Act, 21 U.S.C. section 360bbb-3(b)(1), unless the authorization is terminated or revoked sooner.     Influenza A by PCR NEGATIVE NEGATIVE   Influenza B by PCR NEGATIVE NEGATIVE    Comment: (NOTE) The Xpert Xpress  SARS-CoV-2/FLU/RSV assay is intended as an aid in  the diagnosis of influenza from Nasopharyngeal swab specimens and  should not be used as a sole basis for treatment. Nasal washings and  aspirates are unacceptable for Xpert Xpress SARS-CoV-2/FLU/RSV  testing.  Fact Sheet for Patients: https://www.moore.com/  Fact Sheet for Healthcare Providers: https://www.young.biz/  This test is not yet approved or cleared by the [place] FDA and  has been authorized for detection and/or diagnosis of SARS-CoV-2 by  FDA under an Emergency Use Authorization (EUA). This EUA will remain  in effect (meaning this test can be used) for the duration of the  Covid-19 declaration under Section  564(b)(1) of the Act, 21  U.S.C. section 360bbb-3(b)(1), unless the authorization is  terminated or revoked. Performed at [facility], [address], [place], [place] [address]   D-dimer, quantitative (not at [facility])     Status: Abnormal   Collection Time: 05/05/20  4:28 PM  Result Value Ref Range   D-Dimer, Quant >20.00 (H) 0.00 - 0.50 ug/mL-FEU    Comment: (NOTE) At the manufacturer cut-off of 0.50 ug/mL FEU, this assay has been documented to exclude PE with a sensitivity and negative predictive value of 97 to 99%.  At this time, this assay has not been approved by the FDA to exclude DVT/VTE. Results should be correlated with clinical presentation. Performed at [facility], [address], [place], [place] [address]   Troponin I (High Sensitivity)     Status: Abnormal   Collection Time: 05/05/20  6:25 PM  Result Value Ref Range   Troponin I (High Sensitivity) 640 (HH) <18 ng/L    Comment: CRITICAL RESULT CALLED TO, READ BACK BY AND VERIFIED WITH: J.FERRAINOLO RN 1930 05/05/20 MCCORMICK K (NOTE) Elevated high sensitivity troponin I (hsTnI) values and significant  changes across serial measurements may suggest ACS but many other  chronic and acute  conditions are known to elevate hsTnI results.  Refer to the Links section for chest pain algorithms and additional  guidance. Performed at [facility], [address] [place], [place] [address]   I-Stat venous blood gas, [facility] ED)     Status: Abnormal   Collection Time: 05/05/20  8:18 PM  Result Value Ref Range   pH, Ven 7.418 7.25 - 7.43   pCO2, Ven 47.2 44 - 60 mmHg   pO2, Ven 32.0 32 - 45 mmHg   Bicarbonate 30.5 (H) 20.0 - 28.0 mmol/L   TCO2 32 22 - 32 mmol/L   O2 Saturation 62.0 %   Acid-Base Excess 5.0 (H) 0.0 - 2.0 mmol/L   Sodium 145 135 - 145 mmol/L   Potassium 4.4 3.5 - 5.1 mmol/L   Calcium, Ion 1.16 1.15 - 1.40 mmol/L   HCT 39.0 36 - 46 %   Hemoglobin 13.3 12.0 - 15.0 g/dL   Sample type VENOUS   MRSA PCR Screening     Status: None   Collection Time: 05/05/20 11:21 PM   Specimen: Nasopharyngeal  Result Value Ref Range   MRSA by PCR NEGATIVE NEGATIVE    Comment:        The GeneXpert MRSA Assay (FDA approved for NASAL specimens only), is one component of a comprehensive MRSA colonization surveillance program. It is not intended to diagnose MRSA infection nor to guide or monitor treatment for MRSA infections. Performed at [facility], [address], [place], [place] [address]   Glucose, capillary     Status: Abnormal   Collection Time: 05/06/20 12:30 AM  Result Value Ref Range   Glucose-Capillary 103 (H) 70 - 99 mg/dL    Comment: Glucose reference range applies only to samples taken after fasting for at least 8 hours.  CBC     Status: Abnormal   Collection Time: 05/06/20  1:23 AM  Result Value Ref Range   WBC 10.8 (H) 4.0 - 10.5 K/uL   RBC 3.93 3.87 - 5.11 MIL/uL   Hemoglobin 12.2 12.0 - 15.0 g/dL   HCT 93.2 36 - 46 %   MCV 100.0 80.0 - 100.0 fL   MCH 31.0 26.0 - 34.0 pg   MCHC 31.0  30.0 - 36.0 g/dL   RDW 19.3 79.0 - 24.0 %   Platelets 142 (L) 150 - 400 K/uL   nRBC 0.0 0.0 - 0.2 %    Comment: Performed at [facility], [address], [place], [place] [address]  Brain natriuretic peptide     Status: Abnormal   Collection Time: 05/06/20  1:23 AM  Result Value Ref Range   B Natriuretic Peptide 175.9 (H) 0.0 - 100.0 pg/mL    Comment: Performed at [facility], [address] [place], [place] [address]  Basic metabolic panel     Status: Abnormal   Collection Time: 05/06/20  1:23 AM  Result Value Ref Range   Sodium 142 135 - 145 mmol/L   Potassium 4.2 3.5 - 5.1 mmol/L   Chloride 104 98 - 111 mmol/L   CO2 25 22 - 32 mmol/L   Glucose, Bld 101 (H) 70 - 99 mg/dL    Comment: Glucose reference range applies only to samples taken after fasting for at least 8 hours.   BUN 10 8 - 23 mg/dL   Creatinine, Ser 2.68 0.44 - 1.00 mg/dL   Calcium 9.0 8.9 - 34.1 mg/dL   GFR calc non Af Amer >60 >60 mL/min   GFR calc Af Amer >60 >60 mL/min   Anion gap 13 5 - 15    Comment: Performed at [facility], [address], [place], [place] [address]  Magnesium     Status: None   Collection Time: 05/06/20  1:23 AM  Result Value Ref Range   Magnesium 2.0 1.7 - 2.4 mg/dL    Comment: Performed at [facility], [address] [place], [place] [address]  Phosphorus     Status: None   Collection Time: 05/06/20  1:23 AM  Result Value Ref Range   Phosphorus 3.9 2.5 - 4.6 mg/dL    Comment: Performed at [facility], [address] [place], [place] [address]  APTT     Status: Abnormal   Collection Time: 05/06/20  1:23 AM  Result Value Ref Range   aPTT 45 (H) 24 - 36 seconds    Comment:        IF BASELINE aPTT IS ELEVATED, SUGGEST PATIENT RISK ASSESSMENT BE USED TO DETERMINE APPROPRIATE ANTICOAGULANT THERAPY. Performed at [facility], [address], [place], [place] [address]   Protime-INR     Status: Abnormal   Collection Time: 05/06/20  1:23 AM  Result Value Ref Range   Prothrombin Time 18.8 (H) 11.4 - 15.2 seconds   INR 1.6 (H) 0.8 - 1.2    Comment: (NOTE) INR goal varies based on device and disease  states. Performed at [facility], [address] [place], [place] [address]   Hemoglobin A1c     Status: None   Collection Time: 05/06/20  1:23 AM  Result Value Ref Range   Hgb A1c MFr Bld 5.2 4.8 - 5.6 %    Comment: (NOTE) Pre diabetes:          5.7%-6.4%  Diabetes:              >6.4%  Glycemic control for   <7.0% adults with diabetes    Mean Plasma Glucose 102.54 mg/dL    Comment: Performed at [facility], [address] [place], [place] [address]  Heparin level (unfractionated)     Status: Abnormal   Collection Time: 05/06/20  1:26  AM  Result Value Ref Range   Heparin Unfractionated <0.10 (L) 0.30 - 0.70 IU/mL    Comment: (NOTE) If heparin results are below expected values, and patient dosage has  been confirmed, suggest follow up testing of antithrombin III levels. Performed at [facility], [address] [place], [place] [address]   Glucose, capillary     Status: Abnormal   Collection Time: 05/06/20  4:04 AM  Result Value Ref Range   Glucose-Capillary 116 (H) 70 - 99 mg/dL    Comment: Glucose reference range applies only to samples taken after fasting for at least 8 hours.  Glucose, capillary     Status: Abnormal   Collection Time: 05/06/20  7:30 AM  Result Value Ref Range   Glucose-Capillary 100 (H) 70 - 99 mg/dL    Comment: Glucose reference range applies only to samples taken after fasting for at least 8 hours.    ECG   N/A  Telemetry   Sinus rhythm - Personally Reviewed  Radiology    CT Head Wo Contrast  Result Date: 05/05/2020 CLINICAL DATA:  Mental status change.  Somnolent. EXAM: CT HEAD WITHOUT CONTRAST TECHNIQUE: Contiguous axial images were obtained from the base of the skull through the vertex without intravenous contrast. COMPARISON:  Head CT and brain MRI 02/17/2020 FINDINGS: Brain: No intracranial hemorrhage, mass effect, or midline shift. 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 No  hydrocephalus. The basilar cisterns are patent. Small left superior cerebellar infarcts unchanged from prior. 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 The right pontine infarct on prior is not well visualized. Unchanged chronic small vessel ischemia. No evidence of territorial infarct or acute ischemia. No extra-axial or intracranial fluid collection. Vascular: No hyperdense vessel. Skull: No fracture or focal lesion.  Bifrontal hyperostosis. Sinuses/Orbits: No acute findings. Other: None. IMPRESSION: 1. No acute intracranial abnormality. 2. Stable chronic small vessel ischemia, remote left cerebellar infarct. Pontine infarcts that were acute on July exam demonstrate minimal encephalomalacia on the left, and is not seen on the right. Electronically Signed   By: Narda Rutherford M.D.   On: 05/05/2020 21:04   CT Angio Chest PE W and/or Wo Contrast  Result Date: 05/05/2020 CLINICAL DATA:  Suspected pulmonary embolus with low to intermediate probability. Positive D-dimer. Near syncope. EXAM: CT ANGIOGRAPHY CHEST WITH CONTRAST TECHNIQUE: Multidetector CT imaging of the chest was performed using the standard protocol during bolus administration of intravenous contrast. Multiplanar CT image reconstructions and MIPs were obtained to evaluate the vascular anatomy. CONTRAST:  50mL OMNIPAQUE IOHEXOL 350 MG/ML SOLN COMPARISON:  Chest CT 08/26/2018.  Chest radiograph 05/05/2020 FINDINGS: Cardiovascular: Good opacification of the central and segmental pulmonary arteries. Large filling defects are demonstrated in the central, lobar, and segmental pulmonary arteries bilaterally consistent with acute pulmonary embolus including saddle embolus and large clot burden. The RV to LV ratio is elevated at 2.8 and there is significant reflux of contrast material into the hepatic veins, suggesting right heart strain. No pericardial effusions. Calcification of the aorta. Ascending aorta  measures 4.2 cm diameter. Mediastinum/Nodes: No significant lymphadenopathy. Esophagus is decompressed. Lungs/Pleura: 3 mm nodule in the right mid lung likely calcified. Mild atelectasis in the lung bases. No focal consolidation. No pleural effusions. No pneumothorax. Upper Abdomen: No acute abnormalities demonstrated in the visualized upper abdomen. Musculoskeletal: Lumbar scoliosis with postoperative change. Degenerative changes. Anterior compression of T11 and T12, chronic. No destructive bone lesions. Review of the MIP images  confirms the above findings. IMPRESSION: 1. Large bilateral acute pulmonary embolus including saddle embolus and large clot burden. CT evidence of right heart strain (RV/LV Ratio = 2.8) consistent with at least submassive (intermediate risk) PE. The presence of right heart strain has been associated with an increased risk of morbidity and mortality. 2. Mild atelectasis in the lung bases. 3. Aortic atherosclerosis. Aortic Atherosclerosis (ICD10-I70.0). Critical Value/emergent results were called by telephone at the time of interpretation on 05/05/2020 at 9:05 pm to provider PA [place], who verbally acknowledged these results. Electronically Signed   By: Burman Nieves M.D.   On: 05/05/2020 21:09   DG Chest Portable 1 View  Result Date: 05/05/2020 CLINICAL DATA:  Near syncope EXAM: PORTABLE CHEST 1 VIEW COMPARISON:  November 07, 2019 FINDINGS: The heart size is enlarged. Aortic calcifications are noted. The pulmonary arteries appear to be dilated which can be seen in patients with elevated pulmonary artery pressures. There is no pneumothorax or large pleural effusion. There is a device projecting over the patient's left ventricle which may represent a loop recorder or lead less pacemaker. There is no acute displaced fracture. There are degenerative changes of both glenohumeral joints. IMPRESSION: No active disease. Electronically Signed   By: Katherine Mantle M.D.   On: 05/05/2020 16:56     Cardiac Studies   N/A  Assessment   1. Principal Problem: 2.   Elevated troponin 3. Active Problems: 4.   Acute massive pulmonary embolism (HCC) 5.   Takotsubo cardiomyopathy 6.   History of DVT (deep vein thrombosis) 7.   Dementia (HCC) 8.   Near syncope 9.   Pulmonary embolism (HCC) 10.   Plan   1. Will formally read the echo this morning, however, LV systolic function is  Preserved with some mild RV dysfunction. It appears the alteplase has helped her. Continue on heparin. Will need lifelong anticoagulation with NOAC - could d/c aspirin and Plavix from a cardiac standpoint as no known CAD.  Time Spent Directly with Patient:  I have spent a total of 35 minutes with the patient reviewing hospital notes, telemetry, EKGs, labs and examining the patient as well as establishing an assessment and plan that was discussed personally with the patient.  > 50% of time was spent in direct patient care.  Length of Stay:  LOS: 1 day   Chrystie Nose, MD, [facility], FACP  [place]   [facility]  Medical Director of the Advanced Lipid Disorders &  Cardiovascular Risk Reduction Clinic Diplomate of the American Board of Clinical Lipidology Attending Cardiologist  Direct Dial: [phone]   Fax: [phone]  Website:  [email or web address]  Lisette Abu Haile Toppins 05/06/2020, 8:15 AM

## 2020-05-06 NOTE — Progress Notes (Signed)
ANTICOAGULATION CONSULT NOTE  Pharmacy Consult for Heparin  Indication: 9/24 Large BL saddle PE   Allergies  Allergen Reactions  . T-Pa [Alteplase] Swelling    Lip swelling immediately post tPA  . Codeine Itching    Patient Measurements: Height: 5\' 2"  (157.5 cm) Weight: 89.9 kg (198 lb 3.1 oz) IBW/kg (Calculated) : 50.1 Heparin Dosing Weight: 70.8 kg  Vital Signs: Temp: 97.7 F (36.5 C) (09/25 0000) Temp Source: Oral (09/25 0000) BP: 156/107 (09/25 0215) Pulse Rate: 66 (09/25 0215)  Labs: Recent Labs    05/05/20 1429 05/05/20 1429 05/05/20 1825 05/05/20 2018 05/06/20 0123  HGB 11.9*   < >  --  13.3 12.2  HCT 38.7  --   --  39.0 39.3  PLT 154  --   --   --  142*  APTT  --   --   --   --  45*  LABPROT  --   --   --   --  18.8*  INR  --   --   --   --  1.6*  CREATININE 1.13*  --   --   --  0.83  TROPONINIHS  --   --  640*  --   --    < > = values in this interval not displayed.    Estimated Creatinine Clearance: 57.3 mL/min (by C-G formula based on SCr of 0.83 mg/dL).   Assessment: 79 yo W found to have large bilateral PEs with RV strain s/p low dose alteplase in ED 9/24.  Pharmacy consulted to dose heparin.   APTT post alteplase is 45, ok to start heparin per protocol however per RN, no IV access and patient oozing from blood draw and IV sites. H/H trended down. RN to call MD and confirm ok to call IV team for access and heparin start.   Goal of Therapy:  Heparin level 0.3-0.7 units/ml Monitor platelets by anticoagulation protocol: Yes   Plan:  Start heparin 850 units/hr when IV access available F/u 6hr HL Monitor daily HL, CBC/plt Monitor for signs/symptoms of bleeding     10/24, PharmD, BCPS, BCCP Clinical Pharmacist  Please check AMION for all [facility] Pharmacy phone numbers After 10:00 PM, call Main Pharmacy [phone]

## 2020-05-06 NOTE — Progress Notes (Signed)
Echocardiogram 2D Echocardiogram has been performed.  [name] 05/06/2020, 8:26 AM

## 2020-05-06 NOTE — Progress Notes (Signed)
NAME:  [name], MRN:  [id number], DOB:  June 10, 1941, LOS: 1 ADMISSION DATE:  05/05/2020, CONSULTATION DATE:  05/06/20 REFERRING MD:  Dr. Margo Aye, CHIEF COMPLAINT:  PE   Brief History   79 y.o. F with PMH CVA in 7/21, Takotsubo cardiomyopathy, DVT/PE, HTN, OSA who had a near syncopal event while getting in the shower and was found to have submassive PE with saddle emboli and evidence of right heart strain.  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  She presented to the emergency department with dyspnea and soft blood pressures, did not require supplemental oxygen or pressors.  However CTA significant for submassive bilateral PE with saddle emboli and evidence of right heart strain.  She had an elevated troponin of  640, BNP pending.  Cardiology consulted and did not suspect ACS.   She was initially admitted to progressive care, however given CT results, PCCM was consulted.  Given her large clot burden TPA indicated, she does have a documented allergy when this was given 2 months ago at the time of her ischemic CVA.  Per her son this was mild lip swelling.  Neurology also reviewed case and felt that though she has had a recent stroke, the benefit of TPA outweighs risk.  This was discussed with her son who was in agreement, patient to be given TPA and admitted to intensive care.  Past Medical History   has a past medical history of Arthritis, Asthma, Chronic back pain, Depression, History of pulmonary embolus (PE), TIA (transient ischemic attack), Hyperlipemia, Hypertension, Personal history of DVT (deep vein thrombosis), Scoliosis, Sleep apnea, and Takotsubo cardiomyopathy (09/16/2019).  Significant Hospital Events   9/24 admit to intensive care  Consults:  Cardiology  Procedures:     Significant Diagnostic Tests:  9/24 CTA chest>>Large filling defects are demonstrated in the central, lobar, and segmental pulmonary arteries bilaterally consistent with acute pulmonary embolus including saddle embolus and large clot burden. 9/24 CT head>> findings stable chronic ischemia  Micro Data:  9/24 SARS-Cov-2>> negative  Antimicrobials:    Interim history/subjective:  Looks better Feels better Bps and respiratory status improved.  Objective   Blood pressure 123/76, pulse 68, temperature (!) 97.4 F (36.3 C), resp. rate 15, height 5\' 2"  (1.575 m), weight 83.3 kg, SpO2 97 %.        Intake/Output Summary (Last 24 hours) at 05/06/2020 0829 Last data filed at 05/06/2020 0100 Gross per 24 hour  Intake 500.33 ml  Output 450 ml  Net 50.33 ml   Filed Weights   05/05/20 1945 05/06/20 0500  Weight: 89.9 kg 83.3 kg   Constitutional: elderly woman in no acute distress Eyes: eyes are anicteric, reactive to light Ears, nose, mouth, and throat: mucous membranes moist, trachea midline Cardiovascular: heart sounds are regular, ext are warm to touch. Moderate LE edema Respiratory: clear, no wheezing, no accessory muscle use Gastrointestinal: abdomen is soft with + BS Skin: No rashes, normal turgor Neurologic: left facial droop, dysarthria stable Psychiatric: RASS 0   BMP, CBC benign  Resolved Hospital Problem list     Assessment & Plan:    Massive pulmonary embolism- improved after systemic TPA, I have updated her allergy list to show she can tolerate with benadryl premedication. This is a recurrent event.  She is already on aspirin at  home, per cardiology may also be on plavix but I do not see this.  Mild RV dysfunction on echo not unexpected. - Given hx of idiopathic stroke, I think she should just be on baby aspirin, agree with no plavix - She should be on lifelong eliquis 5mg  BID, do not need to give higher introductory dosing since she already had tpa - Given  comorbidities I see no benefit to repeating echo in future - Fine to move to floor from my standpoint and home by tomorrow if primary agrees. - PCCM will sign off, call if further questions or concerns.  MD PCCM

## 2020-05-06 NOTE — Care Plan (Signed)
Patient is under ICU. She was admitted with massive pulmonary embolism, She improved after systemic tpa. She should be on lifelong eliquis 5mg  BID, do not need to give higher introductory dosing since she already had tpa. PCCM pick up 9/26.

## 2020-05-07 ENCOUNTER — Inpatient Hospital Stay (HOSPITAL_COMMUNITY): Payer: Medicare Other

## 2020-05-07 LAB — URINALYSIS, ROUTINE W REFLEX MICROSCOPIC
Bilirubin Urine: NEGATIVE
Glucose, UA: NEGATIVE mg/dL
Hgb urine dipstick: NEGATIVE
Ketones, ur: NEGATIVE mg/dL
Nitrite: NEGATIVE
Protein, ur: NEGATIVE mg/dL
Specific Gravity, Urine: 1.03 (ref 1.005–1.030)
pH: 5 (ref 5.0–8.0)

## 2020-05-07 LAB — HEPARIN LEVEL (UNFRACTIONATED): Heparin Unfractionated: 0.34 IU/mL (ref 0.30–0.70)

## 2020-05-07 LAB — CBC
HCT: 26.1 % — ABNORMAL LOW (ref 36.0–46.0)
Hemoglobin: 8 g/dL — ABNORMAL LOW (ref 12.0–15.0)
MCH: 30.8 pg (ref 26.0–34.0)
MCHC: 30.7 g/dL (ref 30.0–36.0)
MCV: 100.4 fL — ABNORMAL HIGH (ref 80.0–100.0)
Platelets: 146 10*3/uL — ABNORMAL LOW (ref 150–400)
RBC: 2.6 MIL/uL — ABNORMAL LOW (ref 3.87–5.11)
RDW: 13.2 % (ref 11.5–15.5)
WBC: 9.9 10*3/uL (ref 4.0–10.5)
nRBC: 0.2 % (ref 0.0–0.2)

## 2020-05-07 LAB — GLUCOSE, CAPILLARY
Glucose-Capillary: 116 mg/dL — ABNORMAL HIGH (ref 70–99)
Glucose-Capillary: 120 mg/dL — ABNORMAL HIGH (ref 70–99)
Glucose-Capillary: 123 mg/dL — ABNORMAL HIGH (ref 70–99)
Glucose-Capillary: 134 mg/dL — ABNORMAL HIGH (ref 70–99)
Glucose-Capillary: 137 mg/dL — ABNORMAL HIGH (ref 70–99)
Glucose-Capillary: 145 mg/dL — ABNORMAL HIGH (ref 70–99)

## 2020-05-07 MED ORDER — OXYCODONE HCL 5 MG PO TABS
5.0000 mg | ORAL_TABLET | ORAL | Status: DC | PRN
  Administered 2020-05-07: 5 mg via ORAL
  Filled 2020-05-07: qty 1

## 2020-05-07 MED ORDER — LINACLOTIDE 145 MCG PO CAPS
145.0000 ug | ORAL_CAPSULE | Freq: Every day | ORAL | Status: DC
  Administered 2020-05-07 – 2020-05-13 (×6): 145 ug via ORAL
  Filled 2020-05-07 (×7): qty 1

## 2020-05-07 MED ORDER — OXYCODONE HCL 5 MG PO TABS
15.0000 mg | ORAL_TABLET | Freq: Four times a day (QID) | ORAL | Status: DC | PRN
  Administered 2020-05-07 – 2020-05-13 (×11): 15 mg via ORAL
  Filled 2020-05-07 (×11): qty 3

## 2020-05-07 MED ORDER — MORPHINE SULFATE (PF) 2 MG/ML IV SOLN
2.0000 mg | Freq: Once | INTRAVENOUS | Status: AC
  Administered 2020-05-07: 2 mg via INTRAVENOUS
  Filled 2020-05-07: qty 1

## 2020-05-07 NOTE — Progress Notes (Signed)
Seen awake on bed GCS-15/15, with ongoing heparin infusion om left forearm, not in respiratory distress, complain of pain on left arm, radiating to left back, and pain on the left side of the chest, RN Onalee Hua reported that doctor from AM duty already aware and to be given with PRM pain med Initially assessed left forearm, swollen with light black discoloration of skin, but still soft warm to touch, with radial pulse + 2  Pt claimed pain scale -8 medicated with PRN med- see MAR

## 2020-05-07 NOTE — Progress Notes (Signed)
Pt had a vagal episode tonight.  When she felt nauseous, and was about to throw up, her heart rate dropped to the 40s and her SBP was around 70.  Became diaphoretic.  Did not loose consciousness but felt "bad" until she threw up. Her vitals slowly started to climb back up again.  Informed the on call physician.  He said to continue to monitor the pt and ordered a one time IV 500 mL Saline bolus and IV Zofran 4 mg every 6 hours as needed for nausea and/or vomiting.  Will continue to monitor.  Harriet Masson, RN

## 2020-05-07 NOTE — Progress Notes (Signed)
Mobility Specialist - Progress Note   05/07/20 1156  Mobility  Activity Contraindicated/medical hold    RN instructed not to see pt today.    Mamie Levers Mobility Specialist Mobility Specialist Phone: [phone]

## 2020-05-07 NOTE — Progress Notes (Signed)
ANTICOAGULATION CONSULT NOTE  Pharmacy Consult for Heparin  Indication: 9/24 Large BL saddle PE   Allergies  Allergen Reactions  . T-Pa [Alteplase] Swelling    Does okay with it if premedicated with benadryl.  . Codeine Itching    Patient Measurements: Height: 5\' 2"  (157.5 cm) Weight: 86.9 kg (191 lb 8 oz) IBW/kg (Calculated) : 50.1 Heparin Dosing Weight: 70.8 kg  Vital Signs: Temp: 98.4 F (36.9 C) (09/26 0810) Temp Source: Oral (09/26 0810) BP: 118/66 (09/26 0810) Pulse Rate: 85 (09/26 0810)  Labs: Recent Labs    05/05/20 1429 05/05/20 1429 05/05/20 1825 05/05/20 2018 05/05/20 2018 05/06/20 0123 05/06/20 0126 05/06/20 1156 05/06/20 1927 05/07/20 0211  HGB 11.9*   < >  --  13.3   < > 12.2  --   --   --  8.0*  HCT 38.7   < >  --  39.0  --  39.3  --   --   --  26.1*  PLT 154  --   --   --   --  142*  --   --   --  146*  APTT  --   --   --   --   --  45*  --   --   --   --   LABPROT  --   --   --   --   --  18.8*  --   --   --   --   INR  --   --   --   --   --  1.6*  --   --   --   --   HEPARINUNFRC  --   --   --   --   --   --    < > 0.31 0.36 0.34  CREATININE 1.13*  --   --   --   --  0.83  --   --   --   --   TROPONINIHS  --   --  640*  --   --   --   --   --   --   --    < > = values in this interval not displayed.    Estimated Creatinine Clearance: 56.2 mL/min (by C-G formula based on SCr of 0.83 mg/dL).   Assessment: 79 yo W found to have large bilateral PEs with RV strain s/p low dose alteplase in ED 9/24.  Pharmacy consulted to dose heparin.   Patient had oozing from blood draw and IV sites yesterday morning - stable now. Heparin level continues to be therapeutic at 0.34, on 850 units/hr. Hgb down (8), PLT stable. Per RN no bleeding or infusion issues.    Goal of Therapy:  Heparin level 0.3-0.7 units/ml Monitor platelets by anticoagulation protocol: Yes   Plan:  Continue heparin infusion at 850 units/hr  Monitor daily HL, CBC/plt Monitor for  signs/symptoms of bleeding  F/U switch to oral [facility]  [facility], PharmD PGY1 Acute Care Pharmacy Resident Phone: [phone] 05/07/2020 9:03 AM  Please check [email or web address] for unit specific pharmacy phone numbers.  Please check AMION for all [facility] Pharmacy phone numbers After 10:00 PM, call Main Pharmacy [phone]

## 2020-05-07 NOTE — Progress Notes (Signed)
PROGRESS NOTE    [name]  BMW:[id number] DOB: 1941-05-15 DOA: 05/05/2020 PCP: [name], MD   Brief Narrative:  79 year old with history of CVA in 02/2020, Takotsubo cardiomyopathy, DVT/PE, HTN, obstructive sleep apnea admitted for syncopal episode. Found to have submassive PE with saddle emboli and right heart strain. Had elevated troponin and cardiology was consulted who did not suspect ACS. PCCM consulted and patient taken to the ICU where she received TPA.   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 Will be on lifelong anticoagulation therapy. -Currently on heparin drip -Supplemental oxygen. Supportive care. -Lower extremity Dopplers-bilateral DVTs -Echocardiogram shows EF of 60% with some right heart heart strain and tricuspid regurgitation. Will likely repeat echocardiogram in a few weeks as outpatient  Left upper extremity pain -Suspicion for hematoma at the TPA injection site. Ultrasound ordered. Warm compresses. Given the extent of PE and DVT unable to stop heparin drip  Syncope secondary to obstructive shock -CT head negative. Rest of the work-up as indicated above  Elevated troponin -Likely from underlying PE.  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  Macrocytic anemia -Hemoglobin stable  History of CVA -Currently on Plavix and statin. Plavix currently on hold.  -Now will be on full dose anticoagulation  Dementia/chronic anxiety -Continue home meds-Namenda, BuSpar  DVT prophylaxis: Heparin drip Code Status: Full code Family  Communication:  Randy updated.   Status is: Inpatient  Remains inpatient appropriate because:Hemodynamically unstable   Dispo:  Patient From: Home  Planned Disposition: Home with Health Care Svc  Expected discharge date: 05/08/20  Medically stable for discharge: No. Due to massive pulmonary embolism with right heart strain, continues to remain on heparin drip.  Body mass index is 35.03 kg/m.    Subjective: Complaining of left arm pain and swelling.   Review of Systems Otherwise negative except as per HPI, including: General: Denies fever, chills, night sweats or unintended weight loss. Resp: Denies cough, wheezing, shortness of breath. Cardiac: Denies chest pain, palpitations, orthopnea, paroxysmal nocturnal dyspnea. GI: Denies abdominal pain, nausea, vomiting, diarrhea or constipation GU: Denies dysuria, frequency, hesitancy or incontinence MS: Denies muscle aches, joint pain or swelling Neuro: Denies headache, neurologic deficits (focal weakness, numbness, tingling), abnormal gait Psych: Denies anxiety, depression, SI/HI/AVH Skin: Denies new rashes or lesions ID: Denies sick contacts, exotic exposures, travel  Examination:  General exam: Appears calm and comfortable  Respiratory system: Clear to auscultation. Respiratory effort normal. Cardiovascular system: S1 & S2 heard, RRR. No JVD, murmurs, rubs, gallops or clicks. No pedal edema. Gastrointestinal system: Abdomen is nondistended, soft and nontender. No organomegaly or masses felt. Normal bowel sounds heard. Central nervous system: Alert and oriented. No focal neurological deficits. Extremities: Left arm swelling and ecchymosis. Tender to touch.  Skin: No rashes, lesions or ulcers Psychiatry: Judgement and insight appear normal. Mood & affect appropriate.   Objective: Vitals:   05/07/20 0400 05/07/20 0500 05/07/20 0534 05/07/20 0810  BP: 111/64   118/66  Pulse:  73 79 85  Resp:  18  18  Temp:  98.1 F (36.7 C)   98.4 F (36.9 C)  TempSrc:  Oral  Oral  SpO2:  96% 98% 97%  Weight:   86.9 kg   Height:        Intake/Output Summary (Last 24 hours) at 05/07/2020 0909 Last data filed at 05/07/2020 0600 Gross per 24 hour  Intake 1251.43 ml  Output 250 ml  Net 1001.43 ml   Filed Weights   05/05/20 1945 05/06/20 0500 05/07/20 0534  Weight: 89.9 kg 83.3 kg 86.9 kg     Data Reviewed:   CBC: Recent Labs  Lab 05/05/20 1429 05/05/20 2018 05/06/20 0123 05/07/20 0211  WBC 9.8  --  10.8* 9.9  HGB 11.9* 13.3 12.2 8.0*  HCT 38.7 39.0 39.3 26.1*  MCV 101.8*  --  100.0 100.4*  PLT 154  --  142* 146*   Basic Metabolic Panel: Recent Labs  Lab 05/05/20 1429 05/05/20 2018 05/06/20 0123  NA 145 145 142  K 3.2* 4.4 4.2  CL 109  --  104  CO2 25  --  25  GLUCOSE 161*  --  101*  BUN 9  --  10  CREATININE 1.13*  --  0.83  CALCIUM 8.9  --  9.0  MG  --   --  2.0  PHOS  --   --  3.9   GFR: Estimated Creatinine Clearance: 56.2 mL/min (by C-G formula based on SCr of 0.83 mg/dL). Liver Function Tests: No results for input(s): AST, ALT, ALKPHOS, BILITOT, PROT, ALBUMIN in the last 168 hours. No results for input(s): LIPASE, AMYLASE in the last 168 hours. No results for input(s): AMMONIA in the last 168 hours. Coagulation Profile: Recent Labs  Lab 05/06/20 0123  INR 1.6*   Cardiac Enzymes: No results for input(s): CKTOTAL, CKMB, CKMBINDEX, TROPONINI in the last 168 hours. BNP (last 3 results) No results for input(s): PROBNP in the last 8760 hours. HbA1C: Recent Labs    05/06/20 0123  HGBA1C 5.2   CBG: Recent Labs  Lab 05/06/20 1952 05/06/20 2312 05/07/20 0012 05/07/20 0413 05/07/20 0812  GLUCAP 108* 109* 120* 116* 137*   Lipid Profile: No results for input(s): CHOL, HDL, LDLCALC, TRIG, CHOLHDL, LDLDIRECT in the last 72 hours. Thyroid Function Tests: No results for input(s): TSH, T4TOTAL, FREET4, T3FREE, THYROIDAB in the last 72 hours. Anemia Panel: No results for input(s):  VITAMINB12, FOLATE, FERRITIN, TIBC, IRON, RETICCTPCT in the last 72 hours. Sepsis Labs: No results for input(s): PROCALCITON, LATICACIDVEN in the last 168 hours.  Recent Results (from the past 240 hour(s))  Respiratory Panel by RT PCR (Flu A&B, Covid) - Nasopharyngeal Swab     Status: None   Collection Time: 05/05/20  4:18 PM   Specimen: Nasopharyngeal Swab  Result Value Ref Range Status   SARS Coronavirus 2 by RT PCR NEGATIVE NEGATIVE Final    Comment: (NOTE) SARS-CoV-2 target nucleic acids are NOT DETECTED.  The SARS-CoV-2 RNA is generally detectable in upper respiratoy specimens during the acute phase of infection. The lowest concentration of SARS-CoV-2 viral copies this assay can detect is 131 copies/mL. A negative result does not preclude SARS-Cov-2 infection and should not be used as the sole basis for treatment or other patient management decisions. A negative result may occur with  improper specimen collection/handling, submission of specimen other than nasopharyngeal swab, presence of viral mutation(s) within the areas targeted by this assay, and inadequate number of viral copies (<131 copies/mL). A negative result must be combined with clinical observations, patient history, and epidemiological information. The expected result is Negative.  Fact Sheet for Patients:  https://www.moore.com/  Fact Sheet for Healthcare Providers:  https://www.young.biz/  This test is no t yet approved or cleared by the [place] FDA and  has been authorized for detection and/or diagnosis of SARS-CoV-2 by FDA under an Emergency Use Authorization (EUA). This EUA will remain  in effect (meaning this test can be used) for the duration of the COVID-19 declaration under Section 564(b)(1) of the Act, 21 U.S.C. section 360bbb-3(b)(1), unless the authorization is terminated or revoked sooner.     Influenza A by PCR NEGATIVE NEGATIVE Final   Influenza B  by PCR NEGATIVE NEGATIVE Final    Comment: (NOTE) The Xpert Xpress SARS-CoV-2/FLU/RSV assay is intended as an aid in  the diagnosis of influenza from Nasopharyngeal swab specimens and  should not be used as a sole basis for treatment. Nasal washings and  aspirates are unacceptable for Xpert Xpress SARS-CoV-2/FLU/RSV  testing.  Fact Sheet for Patients: https://www.moore.com/  Fact Sheet for Healthcare Providers: https://www.young.biz/  This test is not yet approved or cleared by the [place] FDA and  has been authorized for detection and/or diagnosis of SARS-CoV-2 by  FDA under an Emergency Use Authorization (EUA). This EUA will remain  in effect (meaning this test can be used) for the duration of the  Covid-19 declaration under Section 564(b)(1) of the Act, 21  U.S.C. section 360bbb-3(b)(1), unless the authorization is  terminated or revoked. Performed at [facility], [address], [place], [place] [address]   MRSA PCR Screening     Status: None   Collection Time: 05/05/20 11:21 PM   Specimen: Nasopharyngeal  Result Value Ref Range Status   MRSA by PCR NEGATIVE NEGATIVE Final    Comment:        The GeneXpert MRSA Assay (FDA approved for NASAL specimens only), is one component of a comprehensive MRSA colonization surveillance program. It is not intended to diagnose MRSA infection nor to guide or monitor treatment for MRSA infections. Performed at [facility], [address] [place], [place] [address]          Radiology Studies: CT Head Wo Contrast  Result Date: 05/05/2020 CLINICAL DATA:  Mental status change.  Somnolent. EXAM: CT HEAD WITHOUT CONTRAST TECHNIQUE: Contiguous axial images were obtained from the base of the skull through the vertex without intravenous contrast. COMPARISON:  Head CT and brain MRI 02/17/2020 FINDINGS: Brain: No intracranial hemorrhage, mass effect, or midline shift. 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 No hydrocephalus. The basilar cisterns are patent. Small left superior cerebellar infarcts unchanged from prior. 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 The right pontine infarct on prior is not well visualized. Unchanged chronic small vessel ischemia. No evidence of territorial infarct or acute ischemia. No extra-axial or intracranial fluid collection. Vascular: No hyperdense vessel. Skull: No fracture or focal lesion.  Bifrontal hyperostosis. Sinuses/Orbits: No acute findings. Other: None. IMPRESSION: 1. No acute intracranial abnormality. 2. Stable chronic small vessel ischemia, remote left cerebellar infarct. Pontine infarcts that were acute on July exam demonstrate minimal encephalomalacia on the left, and is not seen on the right. Electronically Signed   By: Narda Rutherford M.D.   On: 05/05/2020 21:04   CT Angio Chest PE W and/or Wo Contrast  Result Date: 05/05/2020 CLINICAL DATA:  Suspected pulmonary embolus with low to intermediate probability. Positive D-dimer. Near syncope. EXAM: CT ANGIOGRAPHY CHEST WITH CONTRAST TECHNIQUE: Multidetector CT imaging of the chest was performed using the standard  protocol during bolus administration of intravenous contrast. Multiplanar CT image reconstructions and MIPs were obtained to evaluate the vascular anatomy. CONTRAST:  80mL OMNIPAQUE IOHEXOL 350 MG/ML SOLN COMPARISON:  Chest CT 08/26/2018.  Chest radiograph 05/05/2020 FINDINGS: Cardiovascular: Good opacification of the central and segmental pulmonary arteries. Large filling defects are demonstrated in the central, lobar, and segmental pulmonary arteries bilaterally consistent with acute pulmonary embolus including saddle embolus and large clot burden. The RV to LV ratio is elevated at 2.8 and there is significant reflux of contrast material into the hepatic veins, suggesting right heart strain. No pericardial  effusions. Calcification of the aorta. Ascending aorta measures 4.2 cm diameter. Mediastinum/Nodes: No significant lymphadenopathy. Esophagus is decompressed. Lungs/Pleura: 3 mm nodule in the right mid lung likely calcified. Mild atelectasis in the lung bases. No focal consolidation. No pleural effusions. No pneumothorax. Upper Abdomen: No acute abnormalities demonstrated in the visualized upper abdomen. Musculoskeletal: Lumbar scoliosis with postoperative change. Degenerative changes. Anterior compression of T11 and T12, chronic. No destructive bone lesions. Review of the MIP images confirms the above findings. IMPRESSION: 1. Large bilateral acute pulmonary embolus including saddle embolus and large clot burden. CT evidence of right heart strain (RV/LV Ratio = 2.8) consistent with at least submassive (intermediate risk) PE. The presence of right heart strain has been associated with an increased risk of morbidity and mortality. 2. Mild atelectasis in the lung bases. 3. Aortic atherosclerosis. Aortic Atherosclerosis (ICD10-I70.0). Critical Value/emergent results were called by telephone at the time of interpretation on 05/05/2020 at 9:05 pm to provider PA [place], who verbally acknowledged these results. Electronically Signed   By: Burman Nieves M.D.   On: 05/05/2020 21:09   DG Chest Portable 1 View  Result Date: 05/05/2020 CLINICAL DATA:  Near syncope EXAM: PORTABLE CHEST 1 VIEW COMPARISON:  November 07, 2019 FINDINGS: The heart size is enlarged. Aortic calcifications are noted. The pulmonary arteries appear to be dilated which can be seen in patients with elevated pulmonary artery pressures. There is no pneumothorax or large pleural effusion. There is a device projecting over the patient's left ventricle which may represent a loop recorder or lead less pacemaker. There is no acute displaced fracture. There are degenerative changes of both glenohumeral joints. IMPRESSION: No active disease. Electronically Signed    By: Katherine Mantle M.D.   On: 05/05/2020 16:56   ECHOCARDIOGRAM COMPLETE  Result Date: 05/06/2020    ECHOCARDIOGRAM REPORT   Patient Name:   PASCHA FOGAL Ribera Date of Exam: 05/06/2020 Medical Rec #:  [id number]        Height:       62.0 in Accession #:    [id number]       Weight:       183.6 lb Date of Birth:  August 29, 1940       BSA:          1.844 m Patient Age:    78 years         BP:           132/82 mmHg Patient Gender: F                HR:           59 bpm. 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 CAD; Risk Factors:Sleep Apnea, Hypertension and                 Dyslipidemia.  Sonographer:    Irving Burton Senior RDCS Referring Phys: [id number] Aliene Beams  Sonographer Comments: Technically difficult due to poor echo windows. IMPRESSIONS  1. Left ventricular ejection fraction, by estimation, is 60 to 65%. The left ventricle has normal function. The left ventricle has no regional wall motion abnormalities. There is mild left ventricular hypertrophy. Left ventricular diastolic parameters are consistent with Grade I diastolic dysfunction (impaired relaxation).  2. Findings suggest mild RV dysfunction.. Right ventricular systolic function is mildly reduced. The right ventricular size is mildly enlarged. There is mildly elevated pulmonary artery systolic pressure. The estimated right ventricular systolic pressure is 40.7 mmHg.  3. Left atrial size was mildly dilated.  4. The mitral valve is abnormal. Trivial mitral valve regurgitation.  5. Tricuspid valve regurgitation is mild to moderate.  6. The aortic valve is tricuspid. Aortic valve regurgitation is mild. Mild aortic valve sclerosis is present, with no evidence of aortic valve stenosis.  7. The inferior vena cava is normal in size with greater than 50% respiratory  variability, suggesting right atrial pressure of 3 mmHg. FINDINGS  Left Ventricle: Left ventricular ejection fraction, by estimation, is 60 to 65%. The left ventricle has normal function. The left ventricle has no regional wall motion abnormalities. The left ventricular internal cavity size was normal in size. There is  mild left ventricular hypertrophy. Left ventricular diastolic parameters are consistent with Grade I diastolic dysfunction (impaired relaxation). Normal left ventricular filling pressure. Right Ventricle: Findings suggest mild RV dysfunction. The right ventricular size is mildly enlarged. No increase in right ventricular wall thickness. Right ventricular systolic function is mildly reduced. There is mildly elevated pulmonary artery systolic pressure. The tricuspid regurgitant velocity is 3.07 m/s, and with an assumed right atrial pressure of 3 mmHg, the estimated right ventricular systolic pressure is 40.7 mmHg. Left Atrium: Left atrial size was mildly dilated. Right Atrium: Right atrial size was normal in size. Pericardium: There is no evidence of pericardial effusion. Mitral Valve: The mitral valve is abnormal. There is mild thickening of the mitral valve leaflet(s). Trivial mitral valve regurgitation. Tricuspid Valve: The tricuspid valve is grossly normal. Tricuspid valve regurgitation is mild to moderate. Aortic Valve: The aortic valve is tricuspid. Aortic valve regurgitation is mild. Aortic regurgitation PHT measures 688 msec. Mild aortic valve sclerosis is present, with no evidence of aortic valve stenosis. Pulmonic Valve: The pulmonic valve was grossly normal. Pulmonic valve regurgitation is trivial. Aorta: The aortic root and ascending aorta are structurally normal, with no evidence of dilitation. Venous: The inferior vena cava is normal in size with greater than 50% respiratory variability, suggesting right atrial pressure of 3 mmHg. IAS/Shunts: No atrial level shunt detected by color flow  Doppler.  LEFT VENTRICLE PLAX 2D LVIDd:         3.70 cm  Diastology LVIDs:         2.40 cm  LV e' medial:    3.70 cm/s LV PW:         0.80 cm  LV E/e' medial:  13.3 LV IVS:        1.20 cm  LV e' lateral:   7.51 cm/s LVOT diam:     2.20 cm  LV E/e' lateral: 6.6 LV SV:         69 LV SV Index:   37 LVOT Area:  3.80 cm  RIGHT VENTRICLE RV S prime:     14.60 cm/s TAPSE (M-mode): 1.8 cm LEFT ATRIUM             Index       RIGHT ATRIUM           Index LA diam:        3.10 cm 1.68 cm/m  RA Area:     16.50 cm LA Vol (A2C):   40.0 ml 21.70 ml/m RA Volume:   36.00 ml  19.53 ml/m LA Vol (A4C):   79.7 ml 43.23 ml/m LA Biplane Vol: 57.0 ml 30.92 ml/m  AORTIC VALVE LVOT Vmax:   81.60 cm/s LVOT Vmean:  63.700 cm/s LVOT VTI:    0.181 m AI PHT:      688 msec  AORTA Ao Root diam: 3.60 cm Ao Asc diam:  3.40 cm MITRAL VALVE               TRICUSPID VALVE MV Area (PHT): 1.87 cm    TR Peak grad:   37.7 mmHg MV Decel Time: 405 msec    TR Vmax:        307.00 cm/s MV E velocity: 49.30 cm/s MV A velocity: 49.30 cm/s  SHUNTS MV E/A ratio:  1.00        Systemic VTI:  0.18 m                            Systemic Diam: 2.20 cm Zoila Shutter MD Electronically signed by Zoila Shutter MD Signature Date/Time: 05/06/2020/10:41:18 AM    Final    VAS [place] LOWER EXTREMITY VENOUS (DVT)  Result Date: 05/07/2020  Lower Venous DVT Study Indications: Pulmonary embolism, and Edema.  Limitations: Patient's inability to rotate leg. Comparison Study: Prior study done 02/20/20 and is available for comparison Performing Technologist: Sherren Kerns RVS  Examination Guidelines: A complete evaluation includes B-mode imaging, spectral Doppler, color Doppler, and power Doppler as needed of all accessible portions of each vessel. Bilateral testing is considered an integral part of a complete examination. Limited examinations for reoccurring indications may be performed as noted. The reflux portion of the exam is performed with the patient in reverse  Trendelenburg.  +---------+---------------+---------+-----------+----------+--------------+ RIGHT    CompressibilityPhasicitySpontaneityPropertiesThrombus Aging +---------+---------------+---------+-----------+----------+--------------+ CFV      Full           Yes      Yes                                 +---------+---------------+---------+-----------+----------+--------------+ SFJ      Full                                                        +---------+---------------+---------+-----------+----------+--------------+ FV Prox  Full                                                        +---------+---------------+---------+-----------+----------+--------------+ FV Mid   Full                                                        +---------+---------------+---------+-----------+----------+--------------+  FV DistalFull                                                        +---------+---------------+---------+-----------+----------+--------------+ PFV      Full                                                        +---------+---------------+---------+-----------+----------+--------------+ POP      None           No       No                   Acute          +---------+---------------+---------+-----------+----------+--------------+ PTV      None           No       No                   Acute          +---------+---------------+---------+-----------+----------+--------------+ PERO     None           No       No                   Acute          +---------+---------------+---------+-----------+----------+--------------+   +---------+---------------+---------+-----------+----------+-------------------+ LEFT     CompressibilityPhasicitySpontaneityPropertiesThrombus Aging      +---------+---------------+---------+-----------+----------+-------------------+ CFV      Full           Yes      Yes                                       +---------+---------------+---------+-----------+----------+-------------------+ SFJ      Full                                                             +---------+---------------+---------+-----------+----------+-------------------+ FV Prox  Full                                                             +---------+---------------+---------+-----------+----------+-------------------+ FV Mid   Full                                                             +---------+---------------+---------+-----------+----------+-------------------+ FV DistalFull                                                             +---------+---------------+---------+-----------+----------+-------------------+  PFV      Full                                                             +---------+---------------+---------+-----------+----------+-------------------+ POP                     Yes      Yes                  patent by color and                                                       Doppler             +---------+---------------+---------+-----------+----------+-------------------+ PTV      None                                         Acute               +---------+---------------+---------+-----------+----------+-------------------+ PERO     None                                         Acute               +---------+---------------+---------+-----------+----------+-------------------+     Summary: RIGHT: - Findings consistent with acute deep vein thrombosis involving the right popliteal vein, right posterior tibial veins, and right peroneal veins. - A cystic structure is found in the popliteal fossa.  LEFT: - Findings consistent with acute deep vein thrombosis involving the left posterior tibial veins, and left peroneal veins.  *See table(s) above for measurements and observations. Electronically signed by Waverly Ferrari MD on 05/07/2020 at 7:12:53 AM.    Final           Scheduled Meds: . aspirin  324 mg Oral Once  . busPIRone  30 mg Oral BID  . Chlorhexidine Gluconate Cloth  6 each Topical Daily  . donepezil  10 mg Oral QHS  . insulin aspart  0-9 Units Subcutaneous Q4H  . memantine  28 mg Oral Daily   Continuous Infusions: . heparin 850 Units/hr (05/07/20 0739)     LOS: 2 days   Time spent= 35 mins    Tyrece Vanterpool Joline Maxcy, MD [facility]  If 7PM-7AM, please contact night-coverage  05/07/2020, 9:09 AM

## 2020-05-07 NOTE — Progress Notes (Signed)
Pt left arm continues to be severely swollen and very painful.  Upon assessment there is bruising in lower forearm and bruising in upper arm.  Upper left arm skin is firm to touch. Left hand is slighty cooler than right with slightly reduced capillary refill.  Radial pulse is good. In addition to TPA reaction, Pt appears to have possibly developed a hematoma.  Charge nurse came to room to confirm assessment.  Rapid response nurse paged and came to room.  MD Nelson Chimes, Ankit Chirag paged and came to room.  New orders for ultrasound of left arm.  5mg  oxy ordered and given to pt. Warm compresses applied to arm.  Pt still moaning in severe pain. Pt will continue to be monitored closely.

## 2020-05-07 NOTE — Significant Event (Signed)
Rapid Response Event Note   Reason for Call :  TPA reaction  Initial Focused Assessment:  Received a call from charge RN asking if I could come assess the patient who had been given TPA for PE and DVT yesterday.  According to the RN, the TPA was stopped due to allergic reaction to TPA treatment.  Her left arm was significantly swollen and cool to touch.  2+ radial pulse in left arm.  Patient has decreased capillary refill in left phalanges.  Patient was also complaining of pain in her arm.  RN had given 5 mg of oxy, but that has not helped with her pain.       Interventions:  Patient assessed  Plan of Care:  Ultrasound ordered for patient.  If ultrasound is unable to visualize, CT will be ordered.   Event Summary:   MD NotifiedNelson Chimes Call Time: 1117 Arrival Time: 1118 End Time: 1130  Andrey Spearman, RN

## 2020-05-07 NOTE — Progress Notes (Signed)
   Hemodynamically stable s/p tPA for bilateral and saddle PE with RH strain. Noted to have extensive DVT. Will need lifelong anticoagulation. Fortunately, LVEF is preserved and coronaries noted to be minimally diseased earlier this year. No further cardiac recommendations at this time.  [facility] will sign off.   Medication Recommendations:  None Other recommendations (labs, testing, etc):  None Follow up as an outpatient:  Dr. Tyrone Nine, MD, [facility], FACP  [place]  [facility]  Medical Director of the [facility]  Cardiovascular Risk Reduction Clinic Diplomate of the American Board of Clinical Lipidology Attending Cardiologist  Direct Dial: [phone]  Fax: [phone]  Website:  [email or web address]

## 2020-05-08 LAB — CBC
HCT: 25.9 % — ABNORMAL LOW (ref 36.0–46.0)
Hemoglobin: 8 g/dL — ABNORMAL LOW (ref 12.0–15.0)
MCH: 31.1 pg (ref 26.0–34.0)
MCHC: 30.9 g/dL (ref 30.0–36.0)
MCV: 100.8 fL — ABNORMAL HIGH (ref 80.0–100.0)
Platelets: 180 10*3/uL (ref 150–400)
RBC: 2.57 MIL/uL — ABNORMAL LOW (ref 3.87–5.11)
RDW: 13.3 % (ref 11.5–15.5)
WBC: 12.9 10*3/uL — ABNORMAL HIGH (ref 4.0–10.5)
nRBC: 1.8 % — ABNORMAL HIGH (ref 0.0–0.2)

## 2020-05-08 LAB — GLUCOSE, CAPILLARY
Glucose-Capillary: 101 mg/dL — ABNORMAL HIGH (ref 70–99)
Glucose-Capillary: 102 mg/dL — ABNORMAL HIGH (ref 70–99)
Glucose-Capillary: 129 mg/dL — ABNORMAL HIGH (ref 70–99)
Glucose-Capillary: 134 mg/dL — ABNORMAL HIGH (ref 70–99)
Glucose-Capillary: 134 mg/dL — ABNORMAL HIGH (ref 70–99)
Glucose-Capillary: 150 mg/dL — ABNORMAL HIGH (ref 70–99)
Glucose-Capillary: 93 mg/dL (ref 70–99)

## 2020-05-08 LAB — HEPARIN LEVEL (UNFRACTIONATED): Heparin Unfractionated: 0.65 IU/mL (ref 0.30–0.70)

## 2020-05-08 MED ORDER — SENNOSIDES-DOCUSATE SODIUM 8.6-50 MG PO TABS
1.0000 | ORAL_TABLET | Freq: Every day | ORAL | Status: DC
  Administered 2020-05-08 – 2020-05-12 (×5): 1 via ORAL
  Filled 2020-05-08 (×5): qty 1

## 2020-05-08 MED ORDER — SODIUM CHLORIDE 0.9 % IV SOLN
1.0000 g | INTRAVENOUS | Status: DC
  Administered 2020-05-08 – 2020-05-11 (×4): 1 g via INTRAVENOUS
  Filled 2020-05-08 (×4): qty 1

## 2020-05-08 NOTE — Progress Notes (Signed)
PROGRESS NOTE    [name]  ZOX:[id number] DOB: 1940-09-24 DOA: 05/05/2020 PCP: [name], MD   Brief Narrative:  79 year old with history of CVA in 02/2020, Takotsubo cardiomyopathy, DVT/PE, HTN, obstructive sleep apnea admitted for syncopal episode. Found to have submassive PE with saddle emboli and right heart strain. Had elevated troponin and cardiology was consulted who did not suspect ACS. PCCM consulted and patient taken to the ICU where she received TPA.  Complicated by left arm hematoma at the infusion site.   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 Will be on lifelong anticoagulation therapy. -Currently on heparin drip -Supplemental oxygen. Supportive care. -Lower extremity Dopplers-bilateral DVTs -Echocardiogram shows EF of 60% with some right heart heart strain and tricuspid regurgitation. Will likely repeat echocardiogram in a few weeks as outpatient  Left upper extremity pain, slightly improved today. -Hematoma noted on ultrasound.  Continue warm compresses and pain control.  No signs of compartment syndrome.  Given risk and benefit in the setting of large PE with cor pulmonale, should continue anticoagulation.  Dysuria with urinary tract infection -Urine cultures ordered.  Empiric IV Rocephin  Syncope secondary to obstructive shock -CT head negative. Rest of the work-up as indicated above  Elevated troponin -Likely from underlying PE.  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  Macrocytic anemia -Hemoglobin stable  History of CVA -Currently  on Plavix and statin. Plavix currently on hold.  -Now will be on full dose anticoagulation  Dementia/chronic anxiety -Continue home meds-Namenda, BuSpar  DVT prophylaxis: Heparin drip Code Status: Full code Family Communication: None at bedside  Status is: Inpatient  Remains inpatient appropriate because:Hemodynamically unstable   Dispo:  Patient From: Home  Planned Disposition: Home with Health Care Svc  Expected discharge date: 05/09/20  Medically stable for discharge: No. Due to massive pulmonary embolism with right heart strain, continues to remain on heparin drip.  Body mass index is 35.03 kg/m.    Subjective: Feels better in terms of left arm pain today.  Tells me warm compresses and oxycodone is helping her.  Review of Systems Otherwise negative except as per HPI, including: General: Denies fever, chills, night sweats or unintended weight loss. Resp: Denies cough, wheezing, shortness of breath. Cardiac: Denies chest pain, palpitations, orthopnea, paroxysmal nocturnal dyspnea. GI: Denies abdominal pain, nausea, vomiting, diarrhea or constipation GU: Denies dysuria, frequency, hesitancy or incontinence MS: Denies muscle aches, joint pain or swelling Neuro: Denies headache, neurologic deficits (focal weakness, numbness, tingling), abnormal gait Psych: Denies anxiety, depression, SI/HI/AVH Skin: Denies new rashes or lesions ID: Denies sick contacts, exotic exposures, travel  Examination: Constitutional: Not in acute distress, 2 L nasal cannula Respiratory: Clear to auscultation bilaterally Cardiovascular: Normal sinus rhythm, no rubs Abdomen: Nontender nondistended good bowel sounds Musculoskeletal: Left upper extremity swelling and tenderness.  No signs of vascular compromise or compartment syndrome noted. Skin: No rashes seen Neurologic: CN 2-12 grossly intact.  And nonfocal Psychiatric: Normal judgment and insight. Alert and oriented x 3. Normal  mood.   Objective: Vitals:   05/08/20 0000 05/08/20 0300 05/08/20 0400 05/08/20 0500  BP: (!) 142/93  140/86  Pulse: 93 86 89 83  Resp: 18     Temp: 98 F (36.7 C)     TempSrc: Oral     SpO2: 98% 100% 98% 99%  Weight:      Height:        Intake/Output Summary (Last 24 hours) at 05/08/2020 0820 Last data filed at 05/08/2020 0400 Gross per 24 hour  Intake 704.01 ml  Output --  Net 704.01 ml   Filed Weights   05/05/20 1945 05/06/20 0500 05/07/20 0534  Weight: 89.9 kg 83.3 kg 86.9 kg     Data Reviewed:   CBC: Recent Labs  Lab 05/05/20 1429 05/05/20 2018 05/06/20 0123 05/07/20 0211 05/08/20 0303  WBC 9.8  --  10.8* 9.9 12.9*  HGB 11.9* 13.3 12.2 8.0* 8.0*  HCT 38.7 39.0 39.3 26.1* 25.9*  MCV 101.8*  --  100.0 100.4* 100.8*  PLT 154  --  142* 146* 180   Basic Metabolic Panel: Recent Labs  Lab 05/05/20 1429 05/05/20 2018 05/06/20 0123  NA 145 145 142  K 3.2* 4.4 4.2  CL 109  --  104  CO2 25  --  25  GLUCOSE 161*  --  101*  BUN 9  --  10  CREATININE 1.13*  --  0.83  CALCIUM 8.9  --  9.0  MG  --   --  2.0  PHOS  --   --  3.9   GFR: Estimated Creatinine Clearance: 56.2 mL/min (by C-G formula based on SCr of 0.83 mg/dL). Liver Function Tests: No results for input(s): AST, ALT, ALKPHOS, BILITOT, PROT, ALBUMIN in the last 168 hours. No results for input(s): LIPASE, AMYLASE in the last 168 hours. No results for input(s): AMMONIA in the last 168 hours. Coagulation Profile: Recent Labs  Lab 05/06/20 0123  INR 1.6*   Cardiac Enzymes: No results for input(s): CKTOTAL, CKMB, CKMBINDEX, TROPONINI in the last 168 hours. BNP (last 3 results) No results for input(s): PROBNP in the last 8760 hours. HbA1C: Recent Labs    05/06/20 0123  HGBA1C 5.2   CBG: Recent Labs  Lab 05/07/20 1130 05/07/20 1703 05/07/20 1952 05/08/20 0027 05/08/20 0434  GLUCAP 123* 134* 145* 134* 93   Lipid Profile: No results for input(s): CHOL, HDL, LDLCALC, TRIG, CHOLHDL,  LDLDIRECT in the last 72 hours. Thyroid Function Tests: No results for input(s): TSH, T4TOTAL, FREET4, T3FREE, THYROIDAB in the last 72 hours. Anemia Panel: No results for input(s): VITAMINB12, FOLATE, FERRITIN, TIBC, IRON, RETICCTPCT in the last 72 hours. Sepsis Labs: No results for input(s): PROCALCITON, LATICACIDVEN in the last 168 hours.  Recent Results (from the past 240 hour(s))  Respiratory Panel by RT PCR (Flu A&B, Covid) - Nasopharyngeal Swab     Status: None   Collection Time: 05/05/20  4:18 PM   Specimen: Nasopharyngeal Swab  Result Value Ref Range Status   SARS Coronavirus 2 by RT PCR NEGATIVE NEGATIVE Final    Comment: (NOTE) SARS-CoV-2 target nucleic acids are NOT DETECTED.  The SARS-CoV-2 RNA is generally detectable in upper respiratoy specimens during the acute phase of infection. The lowest concentration of SARS-CoV-2 viral copies this assay can detect is 131 copies/mL. A negative result does not preclude SARS-Cov-2 infection and should not be used as the sole basis for treatment or other patient management decisions. A negative result may occur with  improper specimen collection/handling, submission of specimen other than nasopharyngeal swab, presence of viral mutation(s) within the areas targeted by this assay, and inadequate number of  viral copies (<131 copies/mL). A negative result must be combined with clinical observations, patient history, and epidemiological information. The expected result is Negative.  Fact Sheet for Patients:  https://www.moore.com/  Fact Sheet for Healthcare Providers:  https://www.young.biz/  This test is no t yet approved or cleared by the [place] FDA and  has been authorized for detection and/or diagnosis of SARS-CoV-2 by FDA under an Emergency Use Authorization (EUA). This EUA will remain  in effect (meaning this test can be used) for the duration of the COVID-19 declaration under  Section 564(b)(1) of the Act, 21 U.S.C. section 360bbb-3(b)(1), unless the authorization is terminated or revoked sooner.     Influenza A by PCR NEGATIVE NEGATIVE Final   Influenza B by PCR NEGATIVE NEGATIVE Final    Comment: (NOTE) The Xpert Xpress SARS-CoV-2/FLU/RSV assay is intended as an aid in  the diagnosis of influenza from Nasopharyngeal swab specimens and  should not be used as a sole basis for treatment. Nasal washings and  aspirates are unacceptable for Xpert Xpress SARS-CoV-2/FLU/RSV  testing.  Fact Sheet for Patients: https://www.moore.com/  Fact Sheet for Healthcare Providers: https://www.young.biz/  This test is not yet approved or cleared by the [place] FDA and  has been authorized for detection and/or diagnosis of SARS-CoV-2 by  FDA under an Emergency Use Authorization (EUA). This EUA will remain  in effect (meaning this test can be used) for the duration of the  Covid-19 declaration under Section 564(b)(1) of the Act, 21  U.S.C. section 360bbb-3(b)(1), unless the authorization is  terminated or revoked. Performed at [facility], [address] [place], [place] [address]   MRSA PCR Screening     Status: None   Collection Time: 05/05/20 11:21 PM   Specimen: Nasopharyngeal  Result Value Ref Range Status   MRSA by PCR NEGATIVE NEGATIVE Final    Comment:        The GeneXpert MRSA Assay (FDA approved for NASAL specimens only), is one component of a comprehensive MRSA colonization surveillance program. It is not intended to diagnose MRSA infection nor to guide or monitor treatment for MRSA infections. Performed at [facility], [address] [place], [place] [address]          Radiology Studies: ECHOCARDIOGRAM COMPLETE  Result Date: 05/06/2020    ECHOCARDIOGRAM REPORT   Patient Name:   DEANGELA RANDLEMAN Madan Date of Exam: 05/06/2020 Medical Rec #:  [id number]        Height:       62.0 in Accession  #:    [id number]       Weight:       183.6 lb Date of Birth:  08/31/1940       BSA:          1.844 m Patient Age:    78 years         BP:           132/82 mmHg Patient Gender: F                HR:           59 bpm. 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 CAD; Risk Factors:Sleep Apnea, Hypertension and  Dyslipidemia.  Sonographer:    Irving Burton Senior RDCS Referring Phys: [id number] Aliene Beams  Sonographer Comments: Technically difficult due to poor echo windows. IMPRESSIONS  1. Left ventricular ejection fraction, by estimation, is 60 to 65%. The left ventricle has normal function. The left ventricle has no regional wall motion abnormalities. There is mild left ventricular hypertrophy. Left ventricular diastolic parameters are consistent with Grade I diastolic dysfunction (impaired relaxation).  2. Findings suggest mild RV dysfunction.. Right ventricular systolic function is mildly reduced. The right ventricular size is mildly enlarged. There is mildly elevated pulmonary artery systolic pressure. The estimated right ventricular systolic pressure is 40.7 mmHg.  3. Left atrial size was mildly dilated.  4. The mitral valve is abnormal. Trivial mitral valve regurgitation.  5. Tricuspid valve regurgitation is mild to moderate.  6. The aortic valve is tricuspid. Aortic valve regurgitation is mild. Mild aortic valve sclerosis is present, with no evidence of aortic valve stenosis.  7. The inferior vena cava is normal in size with greater than 50% respiratory variability, suggesting right atrial pressure of 3 mmHg. FINDINGS  Left Ventricle: Left ventricular ejection fraction, by estimation, is 60 to 65%. The left ventricle has normal function. The left ventricle has no regional wall motion abnormalities. The left  ventricular internal cavity size was normal in size. There is  mild left ventricular hypertrophy. Left ventricular diastolic parameters are consistent with Grade I diastolic dysfunction (impaired relaxation). Normal left ventricular filling pressure. Right Ventricle: Findings suggest mild RV dysfunction. The right ventricular size is mildly enlarged. No increase in right ventricular wall thickness. Right ventricular systolic function is mildly reduced. There is mildly elevated pulmonary artery systolic pressure. The tricuspid regurgitant velocity is 3.07 m/s, and with an assumed right atrial pressure of 3 mmHg, the estimated right ventricular systolic pressure is 40.7 mmHg. Left Atrium: Left atrial size was mildly dilated. Right Atrium: Right atrial size was normal in size. Pericardium: There is no evidence of pericardial effusion. Mitral Valve: The mitral valve is abnormal. There is mild thickening of the mitral valve leaflet(s). Trivial mitral valve regurgitation. Tricuspid Valve: The tricuspid valve is grossly normal. Tricuspid valve regurgitation is mild to moderate. Aortic Valve: The aortic valve is tricuspid. Aortic valve regurgitation is mild. Aortic regurgitation PHT measures 688 msec. Mild aortic valve sclerosis is present, with no evidence of aortic valve stenosis. Pulmonic Valve: The pulmonic valve was grossly normal. Pulmonic valve regurgitation is trivial. Aorta: The aortic root and ascending aorta are structurally normal, with no evidence of dilitation. Venous: The inferior vena cava is normal in size with greater than 50% respiratory variability, suggesting right atrial pressure of 3 mmHg. IAS/Shunts: No atrial level shunt detected by color flow Doppler.  LEFT VENTRICLE PLAX 2D LVIDd:         3.70 cm  Diastology LVIDs:         2.40 cm  LV e' medial:    3.70 cm/s LV PW:         0.80 cm  LV E/e' medial:  13.3 LV IVS:        1.20 cm  LV e' lateral:   7.51 cm/s LVOT diam:     2.20 cm  LV E/e' lateral: 6.6  LV SV:         69 LV SV Index:   37 LVOT Area:     3.80 cm  RIGHT VENTRICLE RV S prime:     14.60 cm/s TAPSE (M-mode): 1.8 cm LEFT ATRIUM  Index       RIGHT ATRIUM           Index LA diam:        3.10 cm 1.68 cm/m  RA Area:     16.50 cm LA Vol (A2C):   40.0 ml 21.70 ml/m RA Volume:   36.00 ml  19.53 ml/m LA Vol (A4C):   79.7 ml 43.23 ml/m LA Biplane Vol: 57.0 ml 30.92 ml/m  AORTIC VALVE LVOT Vmax:   81.60 cm/s LVOT Vmean:  63.700 cm/s LVOT VTI:    0.181 m AI PHT:      688 msec  AORTA Ao Root diam: 3.60 cm Ao Asc diam:  3.40 cm MITRAL VALVE               TRICUSPID VALVE MV Area (PHT): 1.87 cm    TR Peak grad:   37.7 mmHg MV Decel Time: 405 msec    TR Vmax:        307.00 cm/s MV E velocity: 49.30 cm/s MV A velocity: 49.30 cm/s  SHUNTS MV E/A ratio:  1.00        Systemic VTI:  0.18 m                            Systemic Diam: 2.20 cm Zoila Shutter MD Electronically signed by Zoila Shutter MD Signature Date/Time: 05/06/2020/10:41:18 AM    Final    [place] LT UPPER EXTREM LTD SOFT TISSUE NON VASCULAR  Result Date: 05/07/2020 CLINICAL DATA:  Bruising and swelling in the medial left upper arm and forearm. EXAM: ULTRASOUND left UPPER EXTREMITY LIMITED TECHNIQUE: Ultrasound examination of the upper extremity soft tissues was performed in the area of clinical concern. COMPARISON:  None. FINDINGS: In the left upper arm there is a elongated 23.5 x 5.7 x 5.0 cm fluid collection all along the entire length of the upper arm. Some internal echogenic debris but enhanced through transmission and no blood flow. The forearm area demonstrates edematous soft tissues but no discrete fluid collection. IMPRESSION: 1. Large elongated subcutaneous fluid collection involving the left upper extremity. This could be hematoma or abscess. 2. Diffuse inflammatory/edematous changes in the forearm area without discrete fluid collection. Electronically Signed   By: Rudie Meyer M.D.   On: 05/07/2020 13:38   VAS [place] LOWER EXTREMITY  VENOUS (DVT)  Result Date: 05/07/2020  Lower Venous DVT Study Indications: Pulmonary embolism, and Edema.  Limitations: Patient's inability to rotate leg. Comparison Study: Prior study done 02/20/20 and is available for comparison Performing Technologist: Sherren Kerns RVS  Examination Guidelines: A complete evaluation includes B-mode imaging, spectral Doppler, color Doppler, and power Doppler as needed of all accessible portions of each vessel. Bilateral testing is considered an integral part of a complete examination. Limited examinations for reoccurring indications may be performed as noted. The reflux portion of the exam is performed with the patient in reverse Trendelenburg.  +---------+---------------+---------+-----------+----------+--------------+ RIGHT    CompressibilityPhasicitySpontaneityPropertiesThrombus Aging +---------+---------------+---------+-----------+----------+--------------+ CFV      Full           Yes      Yes                                 +---------+---------------+---------+-----------+----------+--------------+ SFJ      Full                                                        +---------+---------------+---------+-----------+----------+--------------+  FV Prox  Full                                                        +---------+---------------+---------+-----------+----------+--------------+ FV Mid   Full                                                        +---------+---------------+---------+-----------+----------+--------------+ FV DistalFull                                                        +---------+---------------+---------+-----------+----------+--------------+ PFV      Full                                                        +---------+---------------+---------+-----------+----------+--------------+ POP      None           No       No                   Acute           +---------+---------------+---------+-----------+----------+--------------+ PTV      None           No       No                   Acute          +---------+---------------+---------+-----------+----------+--------------+ PERO     None           No       No                   Acute          +---------+---------------+---------+-----------+----------+--------------+   +---------+---------------+---------+-----------+----------+-------------------+ LEFT     CompressibilityPhasicitySpontaneityPropertiesThrombus Aging      +---------+---------------+---------+-----------+----------+-------------------+ CFV      Full           Yes      Yes                                      +---------+---------------+---------+-----------+----------+-------------------+ SFJ      Full                                                             +---------+---------------+---------+-----------+----------+-------------------+ FV Prox  Full                                                             +---------+---------------+---------+-----------+----------+-------------------+  FV Mid   Full                                                             +---------+---------------+---------+-----------+----------+-------------------+ FV DistalFull                                                             +---------+---------------+---------+-----------+----------+-------------------+ PFV      Full                                                             +---------+---------------+---------+-----------+----------+-------------------+ POP                     Yes      Yes                  patent by color and                                                       Doppler             +---------+---------------+---------+-----------+----------+-------------------+ PTV      None                                         Acute                +---------+---------------+---------+-----------+----------+-------------------+ PERO     None                                         Acute               +---------+---------------+---------+-----------+----------+-------------------+     Summary: RIGHT: - Findings consistent with acute deep vein thrombosis involving the right popliteal vein, right posterior tibial veins, and right peroneal veins. - A cystic structure is found in the popliteal fossa.  LEFT: - Findings consistent with acute deep vein thrombosis involving the left posterior tibial veins, and left peroneal veins.  *See table(s) above for measurements and observations. Electronically signed by Waverly Ferrari MD on 05/07/2020 at 7:12:53 AM.    Final         Scheduled Meds:  aspirin  324 mg Oral Once   busPIRone  30 mg Oral BID   Chlorhexidine Gluconate Cloth  6 each Topical Daily   donepezil  10 mg Oral QHS   insulin aspart  0-9 Units Subcutaneous Q4H   linaclotide  145 mcg Oral Daily   memantine  28 mg Oral Daily   Continuous Infusions:  cefTRIAXone (ROCEPHIN)  IV  heparin 850 Units/hr (05/07/20 0739)     LOS: 3 days   Time spent= 35 mins    Willistine Ferrall Joline Maxcy, MD [facility]  If 7PM-7AM, please contact night-coverage  05/08/2020, 8:20 AM

## 2020-05-08 NOTE — Progress Notes (Signed)
Mobility Specialist - Progress Note   05/08/20 1303  Mobility  Activity Contraindicated/medical hold   Instructed by RN not to see pt today.   Mamie Levers Mobility Specialist Mobility Specialist Phone: [phone]

## 2020-05-08 NOTE — Progress Notes (Signed)
ANTICOAGULATION CONSULT NOTE - Follow Up Consult  Pharmacy Consult for Heparin Indication: large bilateral saddle PE and extensive bilateral DVT  Allergies  Allergen Reactions  . T-Pa [Alteplase] Swelling    Does okay with it if premedicated with benadryl.  . Codeine Itching    Patient Measurements: Height: 5\' 2"  (157.5 cm) Weight: 86.9 kg (191 lb 8 oz) IBW/kg (Calculated) : 50.1 Heparin Dosing Weight: 70.8 kg  Vital Signs: Temp: 97.7 F (36.5 C) (09/27 1121) Temp Source: Oral (09/27 1121) BP: 111/86 (09/27 1121) Pulse Rate: 95 (09/27 1121)  Labs: Recent Labs     0000 05/05/20 1429 05/05/20 1825 05/05/20 2018 05/06/20 0123 05/06/20 0123 05/06/20 0126 05/06/20 1927 05/07/20 0211 05/08/20 0303  HGB  --  11.9*  --    < > 12.2   < >  --   --  8.0* 8.0*  HCT  --  38.7  --    < > 39.3  --   --   --  26.1* 25.9*  PLT   < > 154  --   --  142*  --   --   --  146* 180  APTT  --   --   --   --  45*  --   --   --   --   --   LABPROT  --   --   --   --  18.8*  --   --   --   --   --   INR  --   --   --   --  1.6*  --   --   --   --   --   HEPARINUNFRC  --   --   --   --   --   --    < > 0.36 0.34 0.65  CREATININE  --  1.13*  --   --  0.83  --   --   --   --   --   TROPONINIHS  --   --  640*  --   --   --   --   --   --   --    < > = values in this interval not displayed.    Estimated Creatinine Clearance: 56.2 mL/min (by C-G formula based on SCr of 0.83 mg/dL).  Assessment:  79 yo W found to have large bilateral PEs with RV strain s/p low dose alteplase in ED 9/24.  Pharmacy consulted to dose heparin. Duplex 9/25 showed extensive bilateral DVT.    Heparin level remains therapeutic (0.65) on 850 units/hr. but up from prior levels 0.31-0.36 on same infusion rate.  No oozing form blood draw site today, but possible left arm hematoma per ultrasound 9/26.  Hgb low stable, platelet count now into normal range.    Goal of Therapy:  Heparin level 0.3-0.7 units/ml Monitor  platelets by anticoagulation protocol: Yes   Plan:   Decrease heparin drip to 800 units/hr  Recheck heparin level tonight.  Daily heparin level and CBC while on heparin.  Monitor for signs/symptoms of bleeding, and MD assessment of LUE.  Lifelong anticoagulation planned.  10/26, RPh Phone: [phone] 05/08/2020,11:55 AM

## 2020-05-08 NOTE — Plan of Care (Signed)
Continue to monitor

## 2020-05-09 ENCOUNTER — Inpatient Hospital Stay (HOSPITAL_COMMUNITY): Payer: Medicare Other

## 2020-05-09 LAB — BASIC METABOLIC PANEL
Anion gap: 9 (ref 5–15)
BUN: 12 mg/dL (ref 8–23)
CO2: 24 mmol/L (ref 22–32)
Calcium: 8.4 mg/dL — ABNORMAL LOW (ref 8.9–10.3)
Chloride: 104 mmol/L (ref 98–111)
Creatinine, Ser: 0.82 mg/dL (ref 0.44–1.00)
GFR calc Af Amer: >60 mL/min (ref >60)
GFR calc non Af Amer: >60 mL/min (ref >60)
Glucose, Bld: 153 mg/dL — ABNORMAL HIGH (ref 70–99)
Potassium: 4 mmol/L (ref 3.5–5.1)
Sodium: 137 mmol/L (ref 135–145)

## 2020-05-09 LAB — CBC
HCT: 21.8 % — ABNORMAL LOW (ref 36.0–46.0)
HCT: 25 % — ABNORMAL LOW (ref 36.0–46.0)
Hemoglobin: 6.8 g/dL — CL (ref 12.0–15.0)
Hemoglobin: 8.1 g/dL — ABNORMAL LOW (ref 12.0–15.0)
MCH: 32.1 pg (ref 26.0–34.0)
MCH: 31.6 pg (ref 26.0–34.0)
MCHC: 31.2 g/dL (ref 30.0–36.0)
MCHC: 32.4 g/dL (ref 30.0–36.0)
MCV: 102.8 fL — ABNORMAL HIGH (ref 80.0–100.0)
MCV: 97.7 fL (ref 80.0–100.0)
Platelets: UNDETERMINED 10*3/uL (ref 150–400)
Platelets: 181 10*3/uL (ref 150–400)
RBC: 2.12 MIL/uL — ABNORMAL LOW (ref 3.87–5.11)
RBC: 2.56 MIL/uL — ABNORMAL LOW (ref 3.87–5.11)
RDW: 13.8 % (ref 11.5–15.5)
RDW: 16.2 % — ABNORMAL HIGH (ref 11.5–15.5)
WBC: 12.6 10*3/uL — ABNORMAL HIGH (ref 4.0–10.5)
WBC: 13.7 10*3/uL — ABNORMAL HIGH (ref 4.0–10.5)
nRBC: 5.1 % — ABNORMAL HIGH (ref 0.0–0.2)
nRBC: 6 % — ABNORMAL HIGH (ref 0.0–0.2)

## 2020-05-09 LAB — GLUCOSE, CAPILLARY
Glucose-Capillary: 127 mg/dL — ABNORMAL HIGH (ref 70–99)
Glucose-Capillary: 117 mg/dL — ABNORMAL HIGH (ref 70–99)
Glucose-Capillary: 151 mg/dL — ABNORMAL HIGH (ref 70–99)
Glucose-Capillary: 113 mg/dL — ABNORMAL HIGH (ref 70–99)
Glucose-Capillary: 141 mg/dL — ABNORMAL HIGH (ref 70–99)

## 2020-05-09 LAB — PREPARE RBC (CROSSMATCH)

## 2020-05-09 LAB — HEPARIN LEVEL (UNFRACTIONATED): Heparin Unfractionated: 0.22 IU/mL — ABNORMAL LOW (ref 0.30–0.70)

## 2020-05-09 MED ORDER — VANCOMYCIN HCL 1250 MG/250ML IV SOLN
1250.0000 mg | INTRAVENOUS | Status: DC
  Administered 2020-05-09 – 2020-05-12 (×4): 1250 mg via INTRAVENOUS
  Filled 2020-05-09 (×5): qty 250

## 2020-05-09 MED ORDER — MORPHINE SULFATE (PF) 2 MG/ML IV SOLN
1.0000 mg | Freq: Once | INTRAVENOUS | Status: AC
  Administered 2020-05-09: 1 mg via INTRAVENOUS
  Filled 2020-05-09: qty 1

## 2020-05-09 MED ORDER — ARTIFICIAL TEARS OPHTHALMIC OINT
TOPICAL_OINTMENT | Freq: Every day | OPHTHALMIC | Status: DC
  Filled 2020-05-09: qty 3.5

## 2020-05-09 MED ORDER — CYCLOSPORINE 0.05 % OP EMUL
1.0000 [drp] | Freq: Every day | OPHTHALMIC | Status: DC | PRN
  Filled 2020-05-09: qty 1

## 2020-05-09 MED ORDER — SODIUM CHLORIDE 0.9% IV SOLUTION: Freq: Once | INTRAVENOUS | Status: AC

## 2020-05-09 MED ORDER — VANCOMYCIN HCL IN DEXTROSE 1-5 GM/200ML-% IV SOLN
1000.0000 mg | Freq: Once | INTRAVENOUS | Status: DC
  Filled 2020-05-09: qty 200

## 2020-05-09 MED ORDER — POLYVINYL ALCOHOL 1.4 % OP SOLN
2.0000 [drp] | Freq: Four times a day (QID) | OPHTHALMIC | Status: DC
  Administered 2020-05-09 – 2020-05-13 (×16): 2 [drp] via OPHTHALMIC
  Filled 2020-05-09: qty 15

## 2020-05-09 NOTE — Progress Notes (Addendum)
PROGRESS NOTE    [name]  VOJ:[id number] DOB: November 28, 1940 DOA: 05/05/2020 PCP: [name], MD   Brief Narrative:  79 year old with history of CVA in 02/2020, Takotsubo cardiomyopathy, DVT/PE, HTN, obstructive sleep apnea admitted for syncopal episode. Found to have submassive PE with saddle emboli and right heart strain. Had elevated troponin and cardiology was consulted who did not suspect ACS. PCCM consulted and patient taken to the ICU where she received TPA.  Complicated by left arm hematoma at the infusion site with some blood loss anemia requiring transfusion.   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 Will be on lifelong anticoagulation therapy. -Heparin drip paused for several minutes overnight due to left arm bleeding. -Supplemental oxygen. Supportive care. -Lower extremity Dopplers-bilateral DVTs -Echocardiogram shows EF of 60% with some right heart heart strain and tricuspid regurgitation. Will likely repeat echocardiogram in a few weeks as outpatient  Left upper extremity pain, improved according to her -Hematoma seen on the ultrasound.  Closely monitor, pain control  Acute blood loss anemia Low back pain, chronic but will rule out retroperitoneal bleed -Anemia is likely from some blood loss from her left upper extremity IV TPA infusion site/IV line.  She also has hematoma in that area. -Check CT abdomen pelvis to evaluate for retroperitoneal bleed  Dysuria with urinary tract infection, E. coli -Urine cultures growing E. coli, sensitivities pending.  Empiric IV Rocephin  Syncope secondary to obstructive shock -CT head negative. Rest  of the work-up as indicated above  Elevated troponin -Likely from underlying PE.  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  Macrocytic anemia -Hemoglobin stable  History of CVA -Currently on Plavix and statin. Plavix currently on hold.  -Currently on heparin drip  Dementia/chronic anxiety -Continue home meds-Namenda, BuSpar  DVT prophylaxis: Heparin drip Code Status: Full code Family Communication: None at bedside  Status is: Inpatient  Remains inpatient appropriate because:Hemodynamically unstable   Dispo:  Patient From: Home  Planned Disposition: Home with Health Care Svc  Expected discharge date: 05/11/20  Medically stable for discharge: No. Due to massive pulmonary embolism with right heart strain, continues to remain on heparin drip.  Body mass index is 35.65 kg/m.    Subjective: Overnight patient had quite a bit of bleeding from her left upper extremity at the TPA infusion site and IV line site.  This morning it has subsided, heparin drip has been resumed. During my evaluation she is also reporting of lower back pain which has been somewhat chronic for her.   Examination: Constitutional: Not in acute distress Respiratory: Clear to auscultation bilaterally Cardiovascular: Normal sinus rhythm, no rubs Abdomen: Nontender nondistended good bowel sounds Musculoskeletal: No edema noted Skin: Left upper extremity swelling noted with ecchymosis/hematoma.  Dressing at the site of bleeding.  No evidence of vascular compromise or signs of compartment syndrome. Neurologic: CN 2-12 grossly intact.  And nonfocal Psychiatric: Normal judgment and insight. Alert and oriented x 3. Normal mood.  Objective: Vitals:   05/09/20 0453 05/09/20 0749 05/09/20 1038 05/09/20 1106  BP: 134/82 (!) 147/73 (!) 141/63 (!) 144/69  Pulse: 81 86 75 81  Resp:  18 10  14   Temp: 98.1 F (36.7 C) 98.3 F  (36.8 C) 98.2 F (36.8 C) 98.2 F (36.8 C)  TempSrc: Oral Oral Oral Oral  SpO2: 98% 98% 98% 97%  Weight: 88.4 kg     Height:        Intake/Output Summary (Last 24 hours) at 05/09/2020 1250 Last data filed at 05/09/2020 0700 Gross per 24 hour  Intake 707.91 ml  Output 700 ml  Net 7.91 ml   Filed Weights   05/06/20 0500 05/07/20 0534 05/09/20 0453  Weight: 83.3 kg 86.9 kg 88.4 kg     Data Reviewed:   CBC: Recent Labs  Lab 05/05/20 1429 05/05/20 1429 05/05/20 2018 05/06/20 0123 05/07/20 0211 05/08/20 0303 05/09/20 0333  WBC 9.8  --   --  10.8* 9.9 12.9* 12.6*  HGB 11.9*   < > 13.3 12.2 8.0* 8.0* 6.8*  HCT 38.7   < > 39.0 39.3 26.1* 25.9* 21.8*  MCV 101.8*  --   --  100.0 100.4* 100.8* 102.8*  PLT 154  --   --  142* 146* 180 PLATELET CLUMPS NOTED ON SMEAR, UNABLE TO ESTIMATE   < > = values in this interval not displayed.   Basic Metabolic Panel: Recent Labs  Lab 05/05/20 1429 05/05/20 2018 05/06/20 0123  NA 145 145 142  K 3.2* 4.4 4.2  CL 109  --  104  CO2 25  --  25  GLUCOSE 161*  --  101*  BUN 9  --  10  CREATININE 1.13*  --  0.83  CALCIUM 8.9  --  9.0  MG  --   --  2.0  PHOS  --   --  3.9   GFR: Estimated Creatinine Clearance: 56.7 mL/min (by C-G formula based on SCr of 0.83 mg/dL). Liver Function Tests: No results for input(s): AST, ALT, ALKPHOS, BILITOT, PROT, ALBUMIN in the last 168 hours. No results for input(s): LIPASE, AMYLASE in the last 168 hours. No results for input(s): AMMONIA in the last 168 hours. Coagulation Profile: Recent Labs  Lab 05/06/20 0123  INR 1.6*   Cardiac Enzymes: No results for input(s): CKTOTAL, CKMB, CKMBINDEX, TROPONINI in the last 168 hours. BNP (last 3 results) No results for input(s): PROBNP in the last 8760 hours. HbA1C: No results for input(s): HGBA1C in the last 72 hours. CBG: Recent Labs  Lab 05/08/20 1944 05/08/20 2321 05/09/20 0414 05/09/20 0801 05/09/20 1126  GLUCAP 134* 129* 127* 117* 151*    Lipid Profile: No results for input(s): CHOL, HDL, LDLCALC, TRIG, CHOLHDL, LDLDIRECT in the last 72 hours. Thyroid Function Tests: No results for input(s): TSH, T4TOTAL, FREET4, T3FREE, THYROIDAB in the last 72 hours. Anemia Panel: No results for input(s): VITAMINB12, FOLATE, FERRITIN, TIBC, IRON, RETICCTPCT in the last 72 hours. Sepsis Labs: No results for input(s): PROCALCITON, LATICACIDVEN in the last 168 hours.  Recent Results (from the past 240 hour(s))  Respiratory Panel by RT PCR (Flu A&B, Covid) - Nasopharyngeal Swab     Status: None   Collection Time: 05/05/20  4:18 PM   Specimen: Nasopharyngeal Swab  Result Value Ref Range Status   SARS Coronavirus 2 by RT PCR NEGATIVE NEGATIVE Final    Comment: (NOTE) SARS-CoV-2 target nucleic acids are NOT DETECTED.  The SARS-CoV-2 RNA is generally detectable in upper respiratoy specimens during the acute phase of infection. The lowest concentration of SARS-CoV-2 viral copies this assay can detect is 131 copies/mL. A negative result does not preclude SARS-Cov-2 infection and  should not be used as the sole basis for treatment or other patient management decisions. A negative result may occur with  improper specimen collection/handling, submission of specimen other than nasopharyngeal swab, presence of viral mutation(s) within the areas targeted by this assay, and inadequate number of viral copies (<131 copies/mL). A negative result must be combined with clinical observations, patient history, and epidemiological information. The expected result is Negative.  Fact Sheet for Patients:  https://www.moore.com/  Fact Sheet for Healthcare Providers:  https://www.young.biz/  This test is no t yet approved or cleared by the [place] FDA and  has been authorized for detection and/or diagnosis of SARS-CoV-2 by FDA under an Emergency Use Authorization (EUA). This EUA will remain  in effect  (meaning this test can be used) for the duration of the COVID-19 declaration under Section 564(b)(1) of the Act, 21 U.S.C. section 360bbb-3(b)(1), unless the authorization is terminated or revoked sooner.     Influenza A by PCR NEGATIVE NEGATIVE Final   Influenza B by PCR NEGATIVE NEGATIVE Final    Comment: (NOTE) The Xpert Xpress SARS-CoV-2/FLU/RSV assay is intended as an aid in  the diagnosis of influenza from Nasopharyngeal swab specimens and  should not be used as a sole basis for treatment. Nasal washings and  aspirates are unacceptable for Xpert Xpress SARS-CoV-2/FLU/RSV  testing.  Fact Sheet for Patients: https://www.moore.com/  Fact Sheet for Healthcare Providers: https://www.young.biz/  This test is not yet approved or cleared by the [place] FDA and  has been authorized for detection and/or diagnosis of SARS-CoV-2 by  FDA under an Emergency Use Authorization (EUA). This EUA will remain  in effect (meaning this test can be used) for the duration of the  Covid-19 declaration under Section 564(b)(1) of the Act, 21  U.S.C. section 360bbb-3(b)(1), unless the authorization is  terminated or revoked. Performed at [facility], [address], [place], [place] [address]   MRSA PCR Screening     Status: None   Collection Time: 05/05/20 11:21 PM   Specimen: Nasopharyngeal  Result Value Ref Range Status   MRSA by PCR NEGATIVE NEGATIVE Final    Comment:        The GeneXpert MRSA Assay (FDA approved for NASAL specimens only), is one component of a comprehensive MRSA colonization surveillance program. It is not intended to diagnose MRSA infection nor to guide or monitor treatment for MRSA infections. Performed at [facility], [address] [place], [place] [address]   Culture, Urine     Status: Abnormal (Preliminary result)   Collection Time: 05/08/20 10:12 AM   Specimen: Urine, Catheterized  Result Value Ref  Range Status   Specimen Description URINE, CATHETERIZED  Final   Special Requests NONE  Final   Culture (A)  Final    >=100,000 COLONIES/mL ESCHERICHIA COLI SUSCEPTIBILITIES TO FOLLOW Performed at [facility], [address] [place], [place] [address]    Report Status PENDING  Incomplete         Radiology Studies: [place] LT UPPER EXTREM LTD SOFT TISSUE NON VASCULAR  Result Date: 05/07/2020 CLINICAL DATA:  Bruising and swelling in the medial left upper arm and forearm. EXAM: ULTRASOUND left UPPER EXTREMITY LIMITED TECHNIQUE: Ultrasound examination of the upper extremity soft tissues was performed in the area of clinical concern. COMPARISON:  None. FINDINGS: In the left upper arm there is a elongated 23.5 x 5.7 x 5.0 cm fluid collection all along the entire length of the upper arm. Some internal  echogenic debris but enhanced through transmission and no blood flow. The forearm area demonstrates edematous soft tissues but no discrete fluid collection. IMPRESSION: 1. Large elongated subcutaneous fluid collection involving the left upper extremity. This could be hematoma or abscess. 2. Diffuse inflammatory/edematous changes in the forearm area without discrete fluid collection. Electronically Signed   By: Rudie Meyer M.D.   On: 05/07/2020 13:38        Scheduled Meds: . sodium chloride   Intravenous Once  . busPIRone  30 mg Oral BID  . Chlorhexidine Gluconate Cloth  6 each Topical Daily  . donepezil  10 mg Oral QHS  . insulin aspart  0-9 Units Subcutaneous Q4H  . linaclotide  145 mcg Oral Daily  . memantine  28 mg Oral Daily  . senna-docusate  1 tablet Oral QHS   Continuous Infusions: . cefTRIAXone (ROCEPHIN)  IV 1 g (05/09/20 0935)  . heparin 800 Units/hr (05/08/20 1247)     LOS: 4 days   Time spent= 35 mins    Lashonta Pilling Joline Maxcy, MD [facility]  If 7PM-7AM, please contact night-coverage  05/09/2020, 12:50 PM

## 2020-05-09 NOTE — Progress Notes (Signed)
At around 0415, nurse found pt to have pain in her swollen left arm and a good amount of blood under her arm. The bleeding was coming from an old IV site in her left arm.  Informed Dr. Kathrine Haddock.  He said to temporarily pause the IV Heparin drip and to hold pressure at the bleeding site for 10 minutes.  After applying pressure for about 11 minutes, the bleeding appeared to have stopped.  Placed a pressure dressing over the site and restarted the Heparin drip according to the physicians orders.  Will continue to monitor.  Harriet Masson, RN

## 2020-05-09 NOTE — Progress Notes (Signed)
Pharmacy Antibiotic Note  [name] is a 79 y.o. female admitted on 05/05/2020 with cellulitis.  Pharmacy has been consulted for Vancomycin dosing.  Plan: Vancomycin 1250 mg iv Q 24 hours Follow Scr, progress  Height: 5\' 2"  (157.5 cm) Weight: 88.4 kg (194 lb 14.2 oz) IBW/kg (Calculated) : 50.1  Temp (24hrs), Avg:98.3 F (36.8 C), Min:98.1 F (36.7 C), Max:98.7 F (37.1 C)  Recent Labs  Lab 05/05/20 1429 05/06/20 0123 05/07/20 0211 05/08/20 0303 05/09/20 0333  WBC 9.8 10.8* 9.9 12.9* 12.6*  CREATININE 1.13* 0.83  --   --   --     Estimated Creatinine Clearance: 56.7 mL/min (by C-G formula based on SCr of 0.83 mg/dL).    Allergies  Allergen Reactions  . T-Pa [Alteplase] Swelling    Does okay with it if premedicated with benadryl.  . Codeine Itching    Thank you for allowing pharmacy to be a part of this patient's care. 05/11/20, PharmD 05/09/2020 5:33 PM

## 2020-05-09 NOTE — Progress Notes (Signed)
Mobility Specialist: Progress Note   05/09/20 1741  Mobility  Activity Contraindicated/medical hold   Instructed by RN to not work w/ pt today.   [facility] Yousef Huge Mobility Specialist

## 2020-05-09 NOTE — Progress Notes (Signed)
Case reviewed with Dr. Nelson Chimes. Pretty significant arm bleed. Would hold AC for two weeks. Get IVC filter. Rechallenge with AC in 2 weeks as OP.

## 2020-05-09 NOTE — Progress Notes (Signed)
CRITICAL VALUE ALERT  Critical Value:  Hgb at 6.8  Date & Time Notied:  05/09/20 at 0448  Provider Notified: Dr. Kathrine Haddock  Orders Received/Actions taken: Transfuse 1 Unit of RBCs

## 2020-05-09 NOTE — Progress Notes (Signed)
ANTICOAGULATION CONSULT NOTE - Follow Up Consult  Pharmacy Consult for Heparin Indication: large bilateral saddle PE and extensive bilateral DVT  Allergies  Allergen Reactions  . T-Pa [Alteplase] Swelling    Does okay with it if premedicated with benadryl.  . Codeine Itching    Patient Measurements: Height: 5\' 2"  (157.5 cm) Weight: 88.4 kg (194 lb 14.2 oz) IBW/kg (Calculated) : 50.1 Heparin Dosing Weight: 70.8 kg  Vital Signs: Temp: 98.1 F (36.7 C) (09/28 0453) Temp Source: Oral (09/28 0453) BP: 134/82 (09/28 0453) Pulse Rate: 81 (09/28 0453)  Labs: Recent Labs    05/07/20 0211 05/07/20 0211 05/08/20 0303 05/09/20 0333  HGB 8.0*   < > 8.0* 6.8*  HCT 26.1*  --  25.9* 21.8*  PLT 146*  --  180 PLATELET CLUMPS NOTED ON SMEAR, UNABLE TO ESTIMATE  HEPARINUNFRC 0.34  --  0.65 0.22*   < > = values in this interval not displayed.    Estimated Creatinine Clearance: 56.7 mL/min (by C-G formula based on SCr of 0.83 mg/dL).  Assessment:  79 yo W found to have large bilateral PEs with RV strain s/p low dose alteplase in ED 9/24.  Pharmacy consulted to dose heparin. Duplex 9/25 showed extensive bilateral DVT. -heparin level= 0.22, hg 8>>6.8 -worsening/bleeding from arm and heparin held for ~ 10 minutes on 9/28 am   Goal of Therapy:  Heparin level 0.3-0.7 units/ml Monitor platelets by anticoagulation protocol: Yes   Plan:  No heparin changes now, heparin level was previously at goal Recheck later today Daily heparin level and CBC while on heparin. Lifelong anticoagulation planned.  10/28, PharmD Clinical Pharmacist **Pharmacist phone directory can now be found on [email or web address] ([email or web address]).  Listed under [facility].

## 2020-05-09 NOTE — Progress Notes (Signed)
Patient CT abdomen pelvis reviewed-there is evidence of severe cellulitis of the right hemipelvis/hip area.  Large left upper extremity evolving hematoma.  In the setting of significant hemoglobin drop, will discontinue anticoagulation/heparin drip, make patient n.p.o. past midnight for IVC filter placement tomorrow. Also started her on IV vancomycin to cover for cellulitis.  Discussed with Dr. Myrla Halsted from pulmonary.  Appreciate his input.  Stephania Fragmin MD [facility]

## 2020-05-10 ENCOUNTER — Inpatient Hospital Stay (HOSPITAL_COMMUNITY): Payer: Medicare Other

## 2020-05-10 DIAGNOSIS — L03115 Cellulitis of right lower limb: Secondary | ICD-10-CM

## 2020-05-10 HISTORY — PX: IR IVC FILTER PLMT / S&I /IMG GUID/MOD SED: IMG701

## 2020-05-10 LAB — BASIC METABOLIC PANEL
Anion gap: 10 (ref 5–15)
BUN: 11 mg/dL (ref 8–23)
CO2: 24 mmol/L (ref 22–32)
Calcium: 8.4 mg/dL — ABNORMAL LOW (ref 8.9–10.3)
Chloride: 105 mmol/L (ref 98–111)
Creatinine, Ser: 0.72 mg/dL (ref 0.44–1.00)
GFR calc Af Amer: >60 mL/min (ref >60)
GFR calc non Af Amer: >60 mL/min (ref >60)
Glucose, Bld: 110 mg/dL — ABNORMAL HIGH (ref 70–99)
Potassium: 3.8 mmol/L (ref 3.5–5.1)
Sodium: 139 mmol/L (ref 135–145)

## 2020-05-10 LAB — URINE CULTURE: Culture: >=100000 — AB

## 2020-05-10 LAB — GLUCOSE, CAPILLARY
Glucose-Capillary: 120 mg/dL — ABNORMAL HIGH (ref 70–99)
Glucose-Capillary: 123 mg/dL — ABNORMAL HIGH (ref 70–99)
Glucose-Capillary: 126 mg/dL — ABNORMAL HIGH (ref 70–99)
Glucose-Capillary: 130 mg/dL — ABNORMAL HIGH (ref 70–99)
Glucose-Capillary: 64 mg/dL — ABNORMAL LOW (ref 70–99)
Glucose-Capillary: 77 mg/dL (ref 70–99)
Glucose-Capillary: 87 mg/dL (ref 70–99)

## 2020-05-10 MED ORDER — FENTANYL CITRATE (PF) 100 MCG/2ML IJ SOLN
INTRAMUSCULAR | Status: AC | PRN
  Administered 2020-05-10: 25 ug via INTRAVENOUS
  Administered 2020-05-10: 50 ug via INTRAVENOUS

## 2020-05-10 MED ORDER — IOHEXOL 300 MG/ML  SOLN
150.0000 mL | Freq: Once | INTRAMUSCULAR | Status: AC | PRN
  Administered 2020-05-10: 85 mL via INTRAVENOUS

## 2020-05-10 MED ORDER — FENTANYL CITRATE (PF) 100 MCG/2ML IJ SOLN
INTRAMUSCULAR | Status: AC
  Filled 2020-05-10: qty 2

## 2020-05-10 MED ORDER — MIDAZOLAM HCL 2 MG/2ML IJ SOLN
INTRAMUSCULAR | Status: AC
  Filled 2020-05-10: qty 2

## 2020-05-10 MED ORDER — LIDOCAINE HCL 1 % IJ SOLN
INTRAMUSCULAR | Status: AC
  Filled 2020-05-10: qty 20

## 2020-05-10 MED ORDER — MIDAZOLAM HCL 2 MG/2ML IJ SOLN
INTRAMUSCULAR | Status: AC | PRN
  Administered 2020-05-10 (×2): 0.5 mg via INTRAVENOUS

## 2020-05-10 MED ORDER — LIDOCAINE HCL (PF) 1 % IJ SOLN
INTRAMUSCULAR | Status: AC | PRN
  Administered 2020-05-10: 5 mL

## 2020-05-10 NOTE — Progress Notes (Signed)
Patient arrived back to room at this time from IR. V/s and assessment complete. Will continue to monitor

## 2020-05-10 NOTE — Progress Notes (Addendum)
Hypoglycemic Event  CBG: 64  Treatment: 8 oz juice/soda, [place] sandwich  Symptoms: None  Follow-up CBG: Time: 1319 CBG Result: 87  Possible Reasons for Event: Inadequate meal intake, Pt NPO  Comments/MD notified: Will continue to monitor    [employer]

## 2020-05-10 NOTE — Plan of Care (Signed)
Continue to monitor

## 2020-05-10 NOTE — Consult Note (Signed)
Chief Complaint: Patient was seen in consultation today for  Chief Complaint  Patient presents with  . Near Syncope    Referring Physician(s): Dr. Nelson Chimes   Supervising Physician: Dr. Elby Showers  Patient Status: [facility] - In-pt  History of Present Illness: [name] is a 79 y.o. female with a medical history significant for HTN, TIA, mild dementia, scoliosis, Takotsubo cardiomyopathy and PE/DVT (not on anticoagulation).  She presented to the ED 05/05/20 with altered mental status, left facial droop and a near-syncopal episode at home. Lab work was positive for elevated troponin and severely elevated D-dimer levels - a heparin infusion was started. CT angio revealed a large bilateral acute PE including saddle embolus with evidence of right heart strain. Lower extremity ultrasound was positive for DVTs in both legs. Systemic TPA was administered but discontinued due to a presumed allergic reaction with left arm swelling and pain. The heparin infusion was not discontinued.   Ultrasound imaging of the extremity showed a hematoma but given the large PE with cor pulmonale the heparin infusion continued.   On 05/09/20 the patient's hemoglobin level dropped to 6.8. CT imaging showed an evolving hematoma in the left arm and the decision was made to stop the heparin infusion.   Interventional Radiology has been asked to evaluate this patient for an image-guided inferior vena cava filter placement.   Past Medical History:  Diagnosis Date  . Arthritis   . Asthma   . Chronic back pain   . Depression   . History of pulmonary embolus (PE)   . Hx-TIA (transient ischemic attack)   . Hyperlipemia   . Hypertension   . Personal history of DVT (deep vein thrombosis)   . Scoliosis   . Sleep apnea    cpap - settingsi at 2   . 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 ARTHROSCOPIC REPAIR ACL  02/03/2004  . LEFT HEART CATH AND CORONARY ANGIOGRAPHY  N/A 09/16/2019   Procedure: LEFT HEART CATH AND CORONARY ANGIOGRAPHY;  Surgeon: Yvonne Kendall, MD;  Location: [facility] INVASIVE CV LAB;  Service: Cardiovascular;  Laterality: N/A;  . LOOP RECORDER INSERTION N/A 02/21/2020   Procedure: LOOP RECORDER INSERTION;  Surgeon: Duke Salvia, MD;  Location: [facility] INVASIVE CV LAB;  Service: Cardiovascular;  Laterality: N/A;  . OOPHORECTOMY  1970  . SPINE SURGERY  03/31/2006  . TOTAL KNEE ARTHROPLASTY Left 05/17/2016   Procedure: LEFT TOTAL KNEE ARTHROPLASTY;  Surgeon: Kathryne Hitch, MD;  Location: [facility] ORS;  Service: Orthopedics;  Laterality: Left;  Adductor Block; Spinal to General  . TUBAL LIGATION  1983    Allergies: T-pa [alteplase] and Codeine  Medications: Prior to Admission medications   Medication Sig Start Date End Date Taking? Authorizing Provider  acetaminophen (TYLENOL) 325 MG tablet Take 2 tablets (650 mg total) by mouth every 4 (four) hours as needed for mild pain (or temp > 37.5 C (99.5 F)). 02/21/20  Yes Biby, Sharon L, NP  amLODipine (NORVASC) 10 MG tablet Take 10 mg by mouth daily. 03/02/20  Yes [provider]  artificial tears (LACRILUBE) OINT ophthalmic ointment Place into the left eye at bedtime. Apply left eye bedtime 03/10/20  Yes Angiulli, Mcarthur Rossetti, PA-C  atorvastatin (LIPITOR) 20 MG tablet Take 1 tablet (20 mg total) by mouth at bedtime. 03/09/20  Yes Angiulli, Mcarthur Rossetti, PA-C  busPIRone (BUSPAR) 30 MG tablet Take 1 tablet (30 mg total) by mouth 2 (two) times daily. 04/11/20  Yes  Melony Overly T, PA-C  cholecalciferol (VITAMIN D3) 25 MCG (1000 UNIT) tablet Take 1 tablet (1,000 Units total) by mouth daily. 03/09/20  Yes Angiulli, Mcarthur Rossetti, PA-C  clopidogrel (PLAVIX) 75 MG tablet Take 1 tablet (75 mg total) by mouth daily. 03/09/20  Yes Angiulli, Mcarthur Rossetti, PA-C  cycloSPORINE (RESTASIS) 0.05 % ophthalmic emulsion Place 1 drop into both eyes daily as needed (dry eyes). 02/21/20  Yes Layne Benton, NP    diphenhydramine-acetaminophen (TYLENOL PM) 25-500 MG TABS tablet Take 1 tablet by mouth at bedtime as needed.   Yes [provider]  donepezil (ARICEPT) 10 MG tablet Take 1 tablet (10 mg total) by mouth at bedtime. 03/09/20  Yes Angiulli, Mcarthur Rossetti, PA-C  LINZESS 145 MCG CAPS capsule Take 145 mcg by mouth daily. 04/16/20  Yes [provider]  lisinopril (ZESTRIL) 40 MG tablet Take 40 mg by mouth daily. 04/10/20  Yes [provider]  melatonin 3 MG TABS tablet Take 1 tablet (3 mg total) by mouth at bedtime. 03/09/20  Yes Angiulli, Mcarthur Rossetti, PA-C  memantine (NAMENDA XR) 28 MG CP24 24 hr capsule Take 1 capsule (28 mg total) by mouth daily. 03/09/20  Yes Angiulli, Mcarthur Rossetti, PA-C  Multiple Vitamins-Minerals (MULTI-VITAMIN GUMMIES PO) Take 1 each by mouth daily.    Yes [provider]  nitroGLYCERIN (NITROSTAT) 0.4 MG SL tablet Place 1 tablet (0.4 mg total) under the tongue every 5 (five) minutes as needed for chest pain. 09/17/19  Yes Duke, Roe Rutherford, PA  oxyCODONE (ROXICODONE) 15 MG immediate release tablet Take 1 tablet (15 mg total) by mouth 4 (four) times daily as needed (chronic pain). 03/09/20  Yes Angiulli, Mcarthur Rossetti, PA-C  polyethylene glycol (MIRALAX / GLYCOLAX) 17 g packet Take 17 g by mouth 2 (two) times daily. 03/09/20  Yes Angiulli, Mcarthur Rossetti, PA-C  polyvinyl alcohol (LIQUIFILM TEARS) 1.4 % ophthalmic solution Place 2 drops into the left eye 4 (four) times daily. 02/21/20  Yes Layne Benton, NP  aspirin EC 81 MG EC tablet Take 1 tablet (81 mg total) by mouth daily. 09/18/19   Duke, Roe Rutherford, PA  mirtazapine (REMERON) 15 MG tablet Take 1 tablet (15 mg total) by mouth as needed (at bedtime). Patient not taking: Reported on 05/06/2020 04/11/20   Melony Overly T, PA-C  oxyCODONE (ROXICODONE) 15 MG immediate release tablet Take 1 tablet (15 mg total) by mouth 4 (four) times daily as needed (chronic pain). Patient not taking: Reported on 05/06/2020 02/21/20   Layne Benton, NP     Family History  Problem Relation Age of Onset  . Other Father        kidney problems    Social History   Socioeconomic History  . Marital status: Widowed    Spouse name: Chrissie Noa  . Number of children: 2  . Years of education: 10  . Highest education level: Not on file  Occupational History    Employer: RETIRED  Tobacco Use  . Smoking status: Former Smoker    Packs/day: 1.00    Years: 10.00    Pack years: 10.00    Types: Cigarettes    Quit date: 08/12/1973    Years since quitting: 46.7  . Smokeless tobacco: Never Used  Substance and Sexual Activity  . Alcohol use: No  . Drug use: No  . Sexual activity: Not on file  Other Topics Concern  . Not on file  Social History Narrative   Patient lives at home with spouse.  Caffeine Use: occasionally   Social Determinants of Health   Financial Resource Strain:   . Difficulty of Paying Living Expenses: Not on file  Food Insecurity:   . Worried About Programme researcher, broadcasting/film/video in the Last Year: Not on file  . Ran Out of Food in the Last Year: Not on file  Transportation Needs:   . Lack of Transportation (Medical): Not on file  . Lack of Transportation (Non-Medical): Not on file  Physical Activity:   . Days of Exercise per Week: Not on file  . Minutes of Exercise per Session: Not on file  Stress:   . Feeling of Stress : Not on file  Social Connections:   . Frequency of Communication with Friends and Family: Not on file  . Frequency of Social Gatherings with Friends and Family: Not on file  . Attends Religious Services: Not on file  . Active Member of Clubs or Organizations: Not on file  . Attends Banker Meetings: Not on file  . Marital Status: Not on file    Review of Systems: A 12 point ROS discussed and pertinent positives are indicated in the HPI above.  All other systems are negative.  Review of Systems  Constitutional: Positive for fatigue.  Respiratory: Negative for cough and shortness of  breath.   Cardiovascular: Positive for leg swelling. Negative for chest pain.  Gastrointestinal: Negative for abdominal pain, diarrhea, nausea and vomiting.  Musculoskeletal: Positive for back pain.       Left arm swelling  Neurological: Negative for headaches.  Hematological: Bruises/bleeds easily.    Vital Signs: BP 137/60 (BP Location: Right Arm)   Pulse 66   Temp 97.6 F (36.4 C) (Oral)   Resp 16   Ht 5\' 2"  (1.575 m)   Wt 197 lb 12 oz (89.7 kg)   SpO2 99%   BMI 36.17 kg/m   Physical Exam Constitutional:      General: She is not in acute distress. HENT:     Mouth/Throat:     Mouth: Mucous membranes are moist.     Pharynx: Oropharynx is clear.  Cardiovascular:     Rate and Rhythm: Normal rate and regular rhythm.     Pulses: Normal pulses.     Heart sounds: Normal heart sounds.  Pulmonary:     Effort: Pulmonary effort is normal.     Breath sounds: Normal breath sounds.  Abdominal:     General: Bowel sounds are normal.     Palpations: Abdomen is soft.  Musculoskeletal:        General: Swelling present.     Right lower leg: Edema present.     Left lower leg: Edema present.     Comments: Left arm swelling; unable to use left hand/left arm. Extremity is warm, 2+ radial pulse.   Skin:    General: Skin is warm and dry.  Neurological:     Mental Status: She is alert and oriented to person, place, and time.     Cranial Nerves: Facial asymmetry present.     Comments: Left facial droop     Imaging: CT ABDOMEN PELVIS WO CONTRAST  Result Date: 05/09/2020 CLINICAL DATA:  Abdominal distension and lower back pain. EXAM: CT ABDOMEN AND PELVIS WITHOUT CONTRAST TECHNIQUE: Multidetector CT imaging of the abdomen and pelvis was performed following the standard protocol without IV contrast. COMPARISON:  None. FINDINGS: Lower chest: Very mild atelectasis is seen within the posterior aspects of the bilateral lung bases. Hepatobiliary: No focal liver  abnormality is seen. The  gallbladder is moderately distended. No gallstones, gallbladder wall thickening, or biliary dilatation. Pancreas: Unremarkable. No pancreatic ductal dilatation or surrounding inflammatory changes. Spleen: Normal in size without focal abnormality. Adrenals/Urinary Tract: Adrenal glands are unremarkable. Kidneys are normal, without renal calculi, focal lesion, or hydronephrosis. Bladder is unremarkable. Stomach/Bowel: Stomach is within normal limits. Appendix appears normal. No evidence of bowel wall thickening, distention, or inflammatory changes. Noninflamed diverticula are seen within the sigmoid colon. Vascular/Lymphatic: There is moderate severity calcification and tortuosity of the abdominal aorta and bilateral common iliac arteries, without evidence of aneurysmal dilatation. No enlarged abdominal or pelvic lymph nodes. Reproductive: Uterus and bilateral adnexa are unremarkable. Other: No abdominal wall hernia or abnormality. No abdominopelvic ascites. Musculoskeletal: A 5.8 cm x 6.2 cm area (incompletely imaged) of intramuscular low attenuation is seen along the medial aspect of the left upper extremity. This is adjacent to the mid and distal portions of the left humeral shaft (axial CT images 81 through 18, CT series number 3). A moderate amount of adjacent deep subcutaneous inflammatory fat stranding is seen. Moderate severity subcutaneous inflammatory fat stranding is seen along the lateral aspect of the right hip and right hemipelvis. There is marked severity levoscoliosis of the lumbar spine. Bilateral metallic density pedicle screws are seen from the levels of L2 through S1. An extensive amount of associated streak artifact is seen. Subsequently limited evaluation of the contents of the abdomen and pelvis is noted. IMPRESSION: 1. Large area of intramuscular low attenuation along the medial aspect of the left upper extremity which may represent an evolving hematoma. An infectious or neoplastic process  cannot be excluded. MRI correlation is recommended. 2. Moderate severity cellulitis along the lateral aspect of the right hip and right hemipelvis. 3. Sigmoid diverticulosis without evidence of diverticulitis. 4. 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 5. Aortic atherosclerosis. Aortic Atherosclerosis (ICD10-I70.0). Electronically Signed   By: Aram Candela M.D.   On: 05/09/2020 16:16   CT Head Wo Contrast  Result Date: 05/05/2020 CLINICAL DATA:  Mental status change.  Somnolent. EXAM: CT HEAD WITHOUT CONTRAST TECHNIQUE: Contiguous axial images were obtained from the base of the skull through the vertex without intravenous contrast. COMPARISON:  Head CT and brain MRI 02/17/2020 FINDINGS: Brain: No intracranial hemorrhage, mass effect, or midline shift. 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 No hydrocephalus. The basilar cisterns are patent. Small left superior cerebellar infarcts unchanged from prior. 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 The right pontine infarct on prior is not well visualized. Unchanged chronic small vessel ischemia. No evidence of territorial infarct or acute ischemia. No extra-axial or intracranial fluid collection. Vascular: No hyperdense vessel. Skull: No fracture or focal lesion.  Bifrontal hyperostosis. Sinuses/Orbits: No acute findings. Other: None. IMPRESSION: 1. No acute intracranial abnormality. 2. Stable chronic small vessel ischemia, remote left cerebellar infarct. Pontine infarcts that were acute on July exam demonstrate minimal encephalomalacia on the left, and is not seen on the right. Electronically Signed   By: Narda Rutherford M.D.   On: 05/05/2020 21:04   CT Angio Chest PE W and/or Wo Contrast  Result Date: 05/05/2020 CLINICAL DATA:  Suspected pulmonary embolus with low to intermediate probability. Positive D-dimer. Near syncope. EXAM: CT  ANGIOGRAPHY CHEST WITH CONTRAST TECHNIQUE: Multidetector CT imaging of the chest was performed using the standard protocol during bolus administration of intravenous contrast. Multiplanar CT image reconstructions and MIPs were obtained  to evaluate the vascular anatomy. CONTRAST:  13mL OMNIPAQUE IOHEXOL 350 MG/ML SOLN COMPARISON:  Chest CT 08/26/2018.  Chest radiograph 05/05/2020 FINDINGS: Cardiovascular: Good opacification of the central and segmental pulmonary arteries. Large filling defects are demonstrated in the central, lobar, and segmental pulmonary arteries bilaterally consistent with acute pulmonary embolus including saddle embolus and large clot burden. The RV to LV ratio is elevated at 2.8 and there is significant reflux of contrast material into the hepatic veins, suggesting right heart strain. No pericardial effusions. Calcification of the aorta. Ascending aorta measures 4.2 cm diameter. Mediastinum/Nodes: No significant lymphadenopathy. Esophagus is decompressed. Lungs/Pleura: 3 mm nodule in the right mid lung likely calcified. Mild atelectasis in the lung bases. No focal consolidation. No pleural effusions. No pneumothorax. Upper Abdomen: No acute abnormalities demonstrated in the visualized upper abdomen. Musculoskeletal: Lumbar scoliosis with postoperative change. Degenerative changes. Anterior compression of T11 and T12, chronic. No destructive bone lesions. Review of the MIP images confirms the above findings. IMPRESSION: 1. Large bilateral acute pulmonary embolus including saddle embolus and large clot burden. CT evidence of right heart strain (RV/LV Ratio = 2.8) consistent with at least submassive (intermediate risk) PE. The presence of right heart strain has been associated with an increased risk of morbidity and mortality. 2. Mild atelectasis in the lung bases. 3. Aortic atherosclerosis. Aortic Atherosclerosis (ICD10-I70.0). Critical Value/emergent results were called by telephone at the time  of interpretation on 05/05/2020 at 9:05 pm to provider PA [place], who verbally acknowledged these results. Electronically Signed   By: Burman Nieves M.D.   On: 05/05/2020 21:09   DG Chest Portable 1 View  Result Date: 05/05/2020 CLINICAL DATA:  Near syncope EXAM: PORTABLE CHEST 1 VIEW COMPARISON:  November 07, 2019 FINDINGS: The heart size is enlarged. Aortic calcifications are noted. The pulmonary arteries appear to be dilated which can be seen in patients with elevated pulmonary artery pressures. There is no pneumothorax or large pleural effusion. There is a device projecting over the patient's left ventricle which may represent a loop recorder or lead less pacemaker. There is no acute displaced fracture. There are degenerative changes of both glenohumeral joints. IMPRESSION: No active disease. Electronically Signed   By: Katherine Mantle M.D.   On: 05/05/2020 16:56   ECHOCARDIOGRAM COMPLETE  Result Date: 05/06/2020    ECHOCARDIOGRAM REPORT   Patient Name:   MURLINE WEIGEL Tuel Date of Exam: 05/06/2020 Medical Rec #:  [id number]        Height:       62.0 in Accession #:    [id number]       Weight:       183.6 lb Date of Birth:  May 30, 1941       BSA:          1.844 m Patient Age:    78 years         BP:           132/82 mmHg Patient Gender: F                HR:           59 bpm. 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 CAD; Risk Factors:Sleep Apnea, Hypertension and  Dyslipidemia.  Sonographer:    Irving Burton Senior RDCS Referring Phys: [id number] Aliene Beams  Sonographer Comments: Technically difficult due to poor echo windows. IMPRESSIONS  1. Left ventricular ejection fraction, by estimation, is 60 to 65%. The left ventricle has normal function. The left ventricle has no regional wall motion  abnormalities. There is mild left ventricular hypertrophy. Left ventricular diastolic parameters are consistent with Grade I diastolic dysfunction (impaired relaxation).  2. Findings suggest mild RV dysfunction.. Right ventricular systolic function is mildly reduced. The right ventricular size is mildly enlarged. There is mildly elevated pulmonary artery systolic pressure. The estimated right ventricular systolic pressure is 40.7 mmHg.  3. Left atrial size was mildly dilated.  4. The mitral valve is abnormal. Trivial mitral valve regurgitation.  5. Tricuspid valve regurgitation is mild to moderate.  6. The aortic valve is tricuspid. Aortic valve regurgitation is mild. Mild aortic valve sclerosis is present, with no evidence of aortic valve stenosis.  7. The inferior vena cava is normal in size with greater than 50% respiratory variability, suggesting right atrial pressure of 3 mmHg. FINDINGS  Left Ventricle: Left ventricular ejection fraction, by estimation, is 60 to 65%. The left ventricle has normal function. The left ventricle has no regional wall motion abnormalities. The left ventricular internal cavity size was normal in size. There is  mild left ventricular hypertrophy. Left ventricular diastolic parameters are consistent with Grade I diastolic dysfunction (impaired relaxation). Normal left ventricular filling pressure. Right Ventricle: Findings suggest mild RV dysfunction. The right ventricular size is mildly enlarged. No increase in right ventricular wall thickness. Right ventricular systolic function is mildly reduced. There is mildly elevated pulmonary artery systolic pressure. The tricuspid regurgitant velocity is 3.07 m/s, and with an assumed right atrial pressure of 3 mmHg, the estimated right ventricular systolic pressure is 40.7 mmHg. Left Atrium: Left atrial size was mildly dilated. Right Atrium: Right atrial size was normal in size. Pericardium: There is no evidence of pericardial effusion. Mitral  Valve: The mitral valve is abnormal. There is mild thickening of the mitral valve leaflet(s). Trivial mitral valve regurgitation. Tricuspid Valve: The tricuspid valve is grossly normal. Tricuspid valve regurgitation is mild to moderate. Aortic Valve: The aortic valve is tricuspid. Aortic valve regurgitation is mild. Aortic regurgitation PHT measures 688 msec. Mild aortic valve sclerosis is present, with no evidence of aortic valve stenosis. Pulmonic Valve: The pulmonic valve was grossly normal. Pulmonic valve regurgitation is trivial. Aorta: The aortic root and ascending aorta are structurally normal, with no evidence of dilitation. Venous: The inferior vena cava is normal in size with greater than 50% respiratory variability, suggesting right atrial pressure of 3 mmHg. IAS/Shunts: No atrial level shunt detected by color flow Doppler.  LEFT VENTRICLE PLAX 2D LVIDd:         3.70 cm  Diastology LVIDs:         2.40 cm  LV e' medial:    3.70 cm/s LV PW:         0.80 cm  LV E/e' medial:  13.3 LV IVS:        1.20 cm  LV e' lateral:   7.51 cm/s LVOT diam:     2.20 cm  LV E/e' lateral: 6.6 LV SV:         69 LV SV Index:   37 LVOT Area:     3.80 cm  RIGHT VENTRICLE RV S prime:     14.60 cm/s TAPSE (M-mode): 1.8 cm LEFT ATRIUM  Index       RIGHT ATRIUM           Index LA diam:        3.10 cm 1.68 cm/m  RA Area:     16.50 cm LA Vol (A2C):   40.0 ml 21.70 ml/m RA Volume:   36.00 ml  19.53 ml/m LA Vol (A4C):   79.7 ml 43.23 ml/m LA Biplane Vol: 57.0 ml 30.92 ml/m  AORTIC VALVE LVOT Vmax:   81.60 cm/s LVOT Vmean:  63.700 cm/s LVOT VTI:    0.181 m AI PHT:      688 msec  AORTA Ao Root diam: 3.60 cm Ao Asc diam:  3.40 cm MITRAL VALVE               TRICUSPID VALVE MV Area (PHT): 1.87 cm    TR Peak grad:   37.7 mmHg MV Decel Time: 405 msec    TR Vmax:        307.00 cm/s MV E velocity: 49.30 cm/s MV A velocity: 49.30 cm/s  SHUNTS MV E/A ratio:  1.00        Systemic VTI:  0.18 m                            Systemic  Diam: 2.20 cm Zoila Shutter MD Electronically signed by Zoila Shutter MD Signature Date/Time: 05/06/2020/10:41:18 AM    Final    [place] LT UPPER EXTREM LTD SOFT TISSUE NON VASCULAR  Result Date: 05/07/2020 CLINICAL DATA:  Bruising and swelling in the medial left upper arm and forearm. EXAM: ULTRASOUND left UPPER EXTREMITY LIMITED TECHNIQUE: Ultrasound examination of the upper extremity soft tissues was performed in the area of clinical concern. COMPARISON:  None. FINDINGS: In the left upper arm there is a elongated 23.5 x 5.7 x 5.0 cm fluid collection all along the entire length of the upper arm. Some internal echogenic debris but enhanced through transmission and no blood flow. The forearm area demonstrates edematous soft tissues but no discrete fluid collection. IMPRESSION: 1. Large elongated subcutaneous fluid collection involving the left upper extremity. This could be hematoma or abscess. 2. Diffuse inflammatory/edematous changes in the forearm area without discrete fluid collection. Electronically Signed   By: Rudie Meyer M.D.   On: 05/07/2020 13:38   VAS [place] LOWER EXTREMITY VENOUS (DVT)  Result Date: 05/07/2020  Lower Venous DVT Study Indications: Pulmonary embolism, and Edema.  Limitations: Patient's inability to rotate leg. Comparison Study: Prior study done 02/20/20 and is available for comparison Performing Technologist: Sherren Kerns RVS  Examination Guidelines: A complete evaluation includes B-mode imaging, spectral Doppler, color Doppler, and power Doppler as needed of all accessible portions of each vessel. Bilateral testing is considered an integral part of a complete examination. Limited examinations for reoccurring indications may be performed as noted. The reflux portion of the exam is performed with the patient in reverse Trendelenburg.  +---------+---------------+---------+-----------+----------+--------------+ RIGHT    CompressibilityPhasicitySpontaneityPropertiesThrombus Aging  +---------+---------------+---------+-----------+----------+--------------+ CFV      Full           Yes      Yes                                 +---------+---------------+---------+-----------+----------+--------------+ SFJ      Full                                                        +---------+---------------+---------+-----------+----------+--------------+  FV Prox  Full                                                        +---------+---------------+---------+-----------+----------+--------------+ FV Mid   Full                                                        +---------+---------------+---------+-----------+----------+--------------+ FV DistalFull                                                        +---------+---------------+---------+-----------+----------+--------------+ PFV      Full                                                        +---------+---------------+---------+-----------+----------+--------------+ POP      None           No       No                   Acute          +---------+---------------+---------+-----------+----------+--------------+ PTV      None           No       No                   Acute          +---------+---------------+---------+-----------+----------+--------------+ PERO     None           No       No                   Acute          +---------+---------------+---------+-----------+----------+--------------+   +---------+---------------+---------+-----------+----------+-------------------+ LEFT     CompressibilityPhasicitySpontaneityPropertiesThrombus Aging      +---------+---------------+---------+-----------+----------+-------------------+ CFV      Full           Yes      Yes                                      +---------+---------------+---------+-----------+----------+-------------------+ SFJ      Full                                                              +---------+---------------+---------+-----------+----------+-------------------+ FV Prox  Full                                                             +---------+---------------+---------+-----------+----------+-------------------+  FV Mid   Full                                                             +---------+---------------+---------+-----------+----------+-------------------+ FV DistalFull                                                             +---------+---------------+---------+-----------+----------+-------------------+ PFV      Full                                                             +---------+---------------+---------+-----------+----------+-------------------+ POP                     Yes      Yes                  patent by color and                                                       Doppler             +---------+---------------+---------+-----------+----------+-------------------+ PTV      None                                         Acute               +---------+---------------+---------+-----------+----------+-------------------+ PERO     None                                         Acute               +---------+---------------+---------+-----------+----------+-------------------+     Summary: RIGHT: - Findings consistent with acute deep vein thrombosis involving the right popliteal vein, right posterior tibial veins, and right peroneal veins. - A cystic structure is found in the popliteal fossa.  LEFT: - Findings consistent with acute deep vein thrombosis involving the left posterior tibial veins, and left peroneal veins.  *See table(s) above for measurements and observations. Electronically signed by Waverly Ferrari MD on 05/07/2020 at 7:12:53 AM.    Final     Labs:  CBC: Recent Labs    05/07/20 0211 05/08/20 0303 05/09/20 0333 05/09/20 2030  WBC 9.9 12.9* 12.6* 13.7*  HGB 8.0* 8.0* 6.8* 8.1*  HCT 26.1*  25.9* 21.8* 25.0*  PLT 146* 180 PLATELET CLUMPS NOTED ON SMEAR, UNABLE TO ESTIMATE 181    COAGS: Recent Labs    02/16/20 2142 05/06/20 0123  INR 1.0 1.6*  APTT 23* 45*    BMP: Recent Labs  05/05/20 1429 05/05/20 1429 05/05/20 2018 05/06/20 0123 05/09/20 1832 05/10/20 0425  NA 145   < > 145 142 137 139  K 3.2*   < > 4.4 4.2 4.0 3.8  CL 109  --   --  104 104 105  CO2 25  --   --  25 24 24   GLUCOSE 161*  --   --  101* 153* 110*  BUN 9  --   --  10 12 11   CALCIUM 8.9  --   --  9.0 8.4* 8.4*  CREATININE 1.13*  --   --  0.83 0.82 0.72  GFRNONAA 46*  --   --  >60 >60 >60  GFRAA 54*  --   --  >60 >60 >60   < > = values in this interval not displayed.    LIVER FUNCTION TESTS: Recent Labs    09/16/19 0750 02/16/20 2142 02/22/20 0545  BILITOT 1.0 1.0 0.7  AST 21 21 25   ALT 17 14 22   ALKPHOS 46 58 41  PROT 7.0 7.3 6.0*  ALBUMIN 3.6 3.8 3.1*    TUMOR MARKERS: No results for input(s): AFPTM, CEA, CA199, CHROMGRNA in the last 8760 hours.  Assessment and Plan:  Pulmonary embolism/bilateral DVTs; left arm hematoma; acute blood loss anemia: [name], 79 year old female, is tentatively scheduled to be seen 05/10/20 at [facility] Interventional Radiology department for an image-guided inferior vena cava filter placement.   Risks and benefits discussed with the patient including, but not limited to bleeding, infection, contrast induced renal failure, filter fracture or migration which can lead to emergency surgery or even death, strut penetration with damage or irritation to adjacent structures and caval thrombosis.  All of the patient's questions were answered, patient is agreeable to proceed.  The patient has been NPO. Labs and vitals have been reviewed.   Consent signed and in chart.  Thank you for this interesting consult.  I greatly enjoyed meeting [name] and look forward to participating in their care.  A copy of this report was sent to the  requesting provider on this date.  Electronically Signed: Alwyn Ren, AGACNP-BC [phone] 05/10/2020, 8:59 AM   I spent a total of 20 Minutes    in face to face in clinical consultation, greater than 50% of which was counseling/coordinating care for inferior vena cava filter placement.

## 2020-05-10 NOTE — Progress Notes (Signed)
Patient off the unit to IR at this time

## 2020-05-10 NOTE — Progress Notes (Signed)
Mobility Specialist: Progress Note   05/10/20 1555  Mobility  Activity Dangled on edge of bed  Level of Assistance Maximum assist, patient does 25-49%  Assistive Device None  Mobility Response Tolerated poorly  Mobility performed by Mobility specialist  Bed Position Semi-fowlers  $Mobility charge 1 Mobility   Pre-Mobility: 67 HR, 127/62 BP, 97% SpO2 Post-Mobility: 66 HR, 130/50 BP, 96% SpO2  Pt c/o feeling tired upon entering room but was agreeable to mobility. Pt dangled on EOB for a few minutes. Pt was mod-max assist to EOB and assist while sitting. Pt required frequent verbal cues for hand placement of L hand to assist herself in sitting up straight. Pt c/o of L arm being sore. Pt back to bed with call bell at her side and bed alarm on.   [facility] Sintia Mckissic Mobility Specialist

## 2020-05-10 NOTE — Procedures (Signed)
Interventional Radiology Procedure Note  Procedure: IVC Filter Placement  Findings: Please refer to procedural dictation for full description.  Successful placement of infrarenal IVC filter.  Complications: None.  Estimated Blood Loss: <10 mL  Recommendations: -bedrest for 1 hour with HOB >30 degrees -this filter is retrievable once able to tolerate anticoagulation or risk of VTE diminishes -IR will follow   Marliss Coots, MD Pager: [phone]

## 2020-05-10 NOTE — Progress Notes (Signed)
PROGRESS NOTE    TORRE PIKUS  IOX:[id number] DOB: 03/15/1941 DOA: 05/05/2020 PCP: [name], MD   Brief Narrative:  Patient is a 79 year old female admitted on 05/05/20 for syncope with significant past medical history of CVA in 02/2020, Takotsubo cardiomyopathy, DVT, PE, HTN, OSA.  On admission, troponins were elevated (640), D-dimer >20.00, cardiology consulted with ACS ruled-out.  CT Angio chest was positive for large acute PE, saddle emboli with large clot burden and rt heart strain.  PCCM consulted with TPA given per their recommendation.  Patient developed a left arm hematoma confirmed by U/S at TPA infusion site and had blood loss anemia at TPA infusion site. Patient transfused on 05/09/20 with Hgb now at 8.1.  Due to patient complaint of back pain CT abdomen/pelvis ordered which revealed severe cellulitis of right hemipelvis/hip.  PCCM consulted concerning arm bleed, recommended holding anticoagulation, heparin gtt with plans for IVC filter placement on 05/10/20.    Assessment & Plan:   Principal Problem:   Elevated troponin Active Problems:   Acute massive pulmonary embolism (HCC)   Takotsubo cardiomyopathy   History of DVT (deep vein thrombosis)   Dementia (HCC)   Near syncope   Pulmonary embolism (HCC)   Cellulitis of right hip  Large bilateral acute pulmonary embolism with saddle emboli with cor pulmonale -S/P TPA, complicated by bleeding at left upper arm TPA infusion site -Lower extremity Dopplers positive for rt and lt for DVT, placed on heparin gtt -Heparin gtt held, pending IVC placement with anticoagulation to be restarted in 2 weeks, outpatient -IVC filter placement scheduled for 05/10/20. -Supplemental oxygen as needed, today on 2L [place], maintaining oxygen sats. 97% --Echo EF 60% with rt heart strain, to be followed outpatient.  Left upper arm pain -Hematoma seen on U/S -Pain improving, PRN pain control  Acute blood loss anemia in context of chronic low back  pain -Anemia likely caused by IV TPA administration at left upper arm -CT abdomen/pelvis negative for retroperitoneal bleed  -Transfused PRBC's, Hgb now stable at 8.1.  Elevated troponin -Elevated in context of acute PE  Syncope secondary to obstructive shock -CT head (-), acute PE work-up as described.   Dysuria with Urinary Tract Infection, E. Coli -Tx with empiric Rocephin -Urine culture positive for E. Coli -Sensitivities resulted, transition to po Keflex on 05/11/20.  Cellulitis of right hip -Vancomycin IV, dosed per pharmacy consult.  Macrocytic anemia -Hemoglobin currently stable  Takotsubo cardiomyopathy -Chronic, stable, followed by outpatient cardiology -EF 60 %, grade 1 diastolic dysfunction, diastolic HF  History of CVA -Home medication: Plavix and statin. -Holding Plavix and Heparin gtt -Plan to resume anticoagulation after IVC placement in 2 weeks at outpatient.  Dementia/chronic anxiety -Continue Namenda, BuSpar  DVT prophylaxis: SCDs Start: 05/05/20 2205 Code Status: Full Family Communication:  None at bedside  Status is: Inpatient   Dispo:  Patient From: Home  Planned Disposition: Home with Health Care Svc  Expected discharge date: 05/11/20  Medically stable for discharge: No   Body mass index is 36.17 kg/m.   Consultants:   PCCM  Procedures:   IVC placement 05/10/20  Antimicrobials:   Vancomycin for cellulitis   Subjective:  Patient is comfortable this morning with no acute events overnight.  Patient reports no bleeding during the night of left upper arm, although there was significant bleeding yesterday.  Patient denies back pain this morning. Patient is anticipating today's procedure of IVC placement.   Review of Systems Otherwise negative except as per HPI, including:  General: Denies fever, chills, night sweats or unintended weight loss. Resp: Denies cough, wheezing, shortness of breath. Cardiac: Denies chest pain,  palpitations, orthopnea, paroxysmal nocturnal dyspnea. GI: Denies abdominal pain, nausea, vomiting, diarrhea or constipation GU: Denies dysuria, frequency, hesitancy or incontinence MS: Denies muscle aches, joint pain or swelling Neuro: Denies headache, neurologic deficits (focal weakness, numbness, tingling), abnormal gait Psych: Denies anxiety, depression, SI/HI/AVH Skin: Denies new rashes or lesions ID: Denies sick contacts, exotic exposures, travel  Examination:  General exam: Appears calm and comfortable  Respiratory system: Clear to auscultation. Respiratory effort normal. Cardiovascular system: S1 & S2 heard, RRR. No JVD, murmurs, rubs, gallops or clicks. No pedal edema. Gastrointestinal system: Abdomen is nondistended, soft and nontender. No organomegaly or masses felt. Normal bowel sounds heard. Central nervous system: Alert and oriented. No focal neurological deficits. Extremities: Symmetric 5 x 5 power. Skin: Left upper arm warmth and swelling, dressing covering site of prior bleeding.  No evidence of compartment syndrome/vascular compromise. No rashes, lesions or ulcers Neurologic: Residual deficits from prior CVA in 02/2020, including left sided facial droop, flaccid left arm, strength 1/5 in left arm, inability to lift right leg. Psychiatry: Judgement and insight appear normal. Mood & affect appropriate.     Objective: Vitals:   05/10/20 0020 05/10/20 0433 05/10/20 0442 05/10/20 0802  BP: 129/65 (!) 154/76  137/60  Pulse: 67 68  66  Resp: 16     Temp: 98.1 F (36.7 C) 97.6 F (36.4 C)  97.6 F (36.4 C)  TempSrc: Oral Oral  Oral  SpO2: 97% 96%  99%  Weight:   89.7 kg   Height:        Intake/Output Summary (Last 24 hours) at 05/10/2020 1212 Last data filed at 05/10/2020 0300 Gross per 24 hour  Intake 905 ml  Output 500 ml  Net 405 ml   Filed Weights   05/07/20 0534 05/09/20 0453 05/10/20 0442  Weight: 86.9 kg 88.4 kg 89.7 kg     Data Reviewed:    CBC: Recent Labs  Lab 05/06/20 0123 05/07/20 0211 05/08/20 0303 05/09/20 0333 05/09/20 2030  WBC 10.8* 9.9 12.9* 12.6* 13.7*  HGB 12.2 8.0* 8.0* 6.8* 8.1*  HCT 39.3 26.1* 25.9* 21.8* 25.0*  MCV 100.0 100.4* 100.8* 102.8* 97.7  PLT 142* 146* 180 PLATELET CLUMPS NOTED ON SMEAR, UNABLE TO ESTIMATE 181   Basic Metabolic Panel: Recent Labs  Lab 05/05/20 1429 05/05/20 2018 05/06/20 0123 05/09/20 1832 05/10/20 0425  NA 145 145 142 137 139  K 3.2* 4.4 4.2 4.0 3.8  CL 109  --  104 104 105  CO2 25  --  GLUCOSE 161*  --  101* 153* 110*  BUN 9  --  CREATININE 1.13*  --  0.83 0.82 0.72  CALCIUM 8.9  --  9.0 8.4* 8.4*  MG  --   --  2.0  --   --   PHOS  --   --  3.9  --   --    GFR: Estimated Creatinine Clearance: 59.3 mL/min (by C-G formula based on SCr of 0.72 mg/dL). Liver Function Tests: No results for input(s): AST, ALT, ALKPHOS, BILITOT, PROT, ALBUMIN in the last 168 hours. No results for input(s): LIPASE, AMYLASE in the last 168 hours. No results for input(s): AMMONIA in the last 168 hours. Coagulation Profile: Recent Labs  Lab 05/06/20 0123  INR 1.6*   Cardiac Enzymes: No results for input(s): CKTOTAL, CKMB, CKMBINDEX, TROPONINI in  the last 168 hours. BNP (last 3 results) No results for input(s): PROBNP in the last 8760 hours. HbA1C: No results for input(s): HGBA1C in the last 72 hours. CBG: Recent Labs  Lab 05/09/20 1536 05/09/20 2055 05/10/20 0023 05/10/20 0432 05/10/20 0808  GLUCAP 113* 141* 126* 123* 77   Lipid Profile: No results for input(s): CHOL, HDL, LDLCALC, TRIG, CHOLHDL, LDLDIRECT in the last 72 hours. Thyroid Function Tests: No results for input(s): TSH, T4TOTAL, FREET4, T3FREE, THYROIDAB in the last 72 hours. Anemia Panel: No results for input(s): VITAMINB12, FOLATE, FERRITIN, TIBC, IRON, RETICCTPCT in the last 72 hours. Sepsis Labs: No results for input(s): PROCALCITON, LATICACIDVEN in the last 168 hours.  Recent  Results (from the past 240 hour(s))  Respiratory Panel by RT PCR (Flu A&B, Covid) - Nasopharyngeal Swab     Status: None   Collection Time: 05/05/20  4:18 PM   Specimen: Nasopharyngeal Swab  Result Value Ref Range Status   SARS Coronavirus 2 by RT PCR NEGATIVE NEGATIVE Final    Comment: (NOTE) SARS-CoV-2 target nucleic acids are NOT DETECTED.  The SARS-CoV-2 RNA is generally detectable in upper respiratoy specimens during the acute phase of infection. The lowest concentration of SARS-CoV-2 viral copies this assay can detect is 131 copies/mL. A negative result does not preclude SARS-Cov-2 infection and should not be used as the sole basis for treatment or other patient management decisions. A negative result may occur with  improper specimen collection/handling, submission of specimen other than nasopharyngeal swab, presence of viral mutation(s) within the areas targeted by this assay, and inadequate number of viral copies (<131 copies/mL). A negative result must be combined with clinical observations, patient history, and epidemiological information. The expected result is Negative.  Fact Sheet for Patients:  https://www.moore.com/  Fact Sheet for Healthcare Providers:  https://www.young.biz/  This test is no t yet approved or cleared by the [place] FDA and  has been authorized for detection and/or diagnosis of SARS-CoV-2 by FDA under an Emergency Use Authorization (EUA). This EUA will remain  in effect (meaning this test can be used) for the duration of the COVID-19 declaration under Section 564(b)(1) of the Act, 21 U.S.C. section 360bbb-3(b)(1), unless the authorization is terminated or revoked sooner.     Influenza A by PCR NEGATIVE NEGATIVE Final   Influenza B by PCR NEGATIVE NEGATIVE Final    Comment: (NOTE) The Xpert Xpress SARS-CoV-2/FLU/RSV assay is intended as an aid in  the diagnosis of influenza from Nasopharyngeal swab  specimens and  should not be used as a sole basis for treatment. Nasal washings and  aspirates are unacceptable for Xpert Xpress SARS-CoV-2/FLU/RSV  testing.  Fact Sheet for Patients: https://www.moore.com/  Fact Sheet for Healthcare Providers: https://www.young.biz/  This test is not yet approved or cleared by the [place] FDA and  has been authorized for detection and/or diagnosis of SARS-CoV-2 by  FDA under an Emergency Use Authorization (EUA). This EUA will remain  in effect (meaning this test can be used) for the duration of the  Covid-19 declaration under Section 564(b)(1) of the Act, 21  U.S.C. section 360bbb-3(b)(1), unless the authorization is  terminated or revoked. Performed at [facility], [address] [place], [place] [address]   MRSA PCR Screening     Status: None   Collection Time: 05/05/20 11:21 PM   Specimen: Nasopharyngeal  Result Value Ref Range Status   MRSA by PCR NEGATIVE NEGATIVE Final    Comment:  The GeneXpert MRSA Assay (FDA approved for NASAL specimens only), is one component of a comprehensive MRSA colonization surveillance program. It is not intended to diagnose MRSA infection nor to guide or monitor treatment for MRSA infections. Performed at [facility], [address] [place], [place] [address]   Culture, Urine     Status: Abnormal   Collection Time: 05/08/20 10:12 AM   Specimen: Urine, Catheterized  Result Value Ref Range Status   Specimen Description URINE, CATHETERIZED  Final   Special Requests   Final    NONE Performed at [facility], [address], [place], [place] [address]    Culture >=100,000 COLONIES/mL ESCHERICHIA COLI (A)  Final   Report Status 05/10/2020 FINAL  Final   Organism ID, Bacteria ESCHERICHIA COLI (A)  Final      Susceptibility   Escherichia coli - MIC*    AMPICILLIN <=2 SENSITIVE Sensitive     CEFAZOLIN <=4 SENSITIVE Sensitive      CEFTRIAXONE <=0.25 SENSITIVE Sensitive     CIPROFLOXACIN <=0.25 SENSITIVE Sensitive     GENTAMICIN <=1 SENSITIVE Sensitive     IMIPENEM <=0.25 SENSITIVE Sensitive     NITROFURANTOIN <=16 SENSITIVE Sensitive     TRIMETH/SULFA <=20 SENSITIVE Sensitive     AMPICILLIN/SULBACTAM <=2 SENSITIVE Sensitive     PIP/TAZO <=4 SENSITIVE Sensitive     * >=100,000 COLONIES/mL ESCHERICHIA COLI         Radiology Studies: CT ABDOMEN PELVIS WO CONTRAST  Result Date: 05/09/2020 CLINICAL DATA:  Abdominal distension and lower back pain. EXAM: CT ABDOMEN AND PELVIS WITHOUT CONTRAST TECHNIQUE: Multidetector CT imaging of the abdomen and pelvis was performed following the standard protocol without IV contrast. COMPARISON:  None. FINDINGS: Lower chest: Very mild atelectasis is seen within the posterior aspects of the bilateral lung bases. Hepatobiliary: No focal liver abnormality is seen. The gallbladder is moderately distended. No gallstones, gallbladder wall thickening, or biliary dilatation. Pancreas: Unremarkable. No pancreatic ductal dilatation or surrounding inflammatory changes. Spleen: Normal in size without focal abnormality. Adrenals/Urinary Tract: Adrenal glands are unremarkable. Kidneys are normal, without renal calculi, focal lesion, or hydronephrosis. Bladder is unremarkable. Stomach/Bowel: Stomach is within normal limits. Appendix appears normal. No evidence of bowel wall thickening, distention, or inflammatory changes. Noninflamed diverticula are seen within the sigmoid colon. Vascular/Lymphatic: There is moderate severity calcification and tortuosity of the abdominal aorta and bilateral common iliac arteries, without evidence of aneurysmal dilatation. No enlarged abdominal or pelvic lymph nodes. Reproductive: Uterus and bilateral adnexa are unremarkable. Other: No abdominal wall hernia or abnormality. No abdominopelvic ascites. Musculoskeletal: A 5.8 cm x 6.2 cm area (incompletely imaged) of intramuscular  low attenuation is seen along the medial aspect of the left upper extremity. This is adjacent to the mid and distal portions of the left humeral shaft (axial CT images 81 through 18, CT series number 3). A moderate amount of adjacent deep subcutaneous inflammatory fat stranding is seen. Moderate severity subcutaneous inflammatory fat stranding is seen along the lateral aspect of the right hip and right hemipelvis. There is marked severity levoscoliosis of the lumbar spine. Bilateral metallic density pedicle screws are seen from the levels of L2 through S1. An extensive amount of associated streak artifact is seen. Subsequently limited evaluation of the contents of the abdomen and pelvis is noted. IMPRESSION: 1. Large area of intramuscular low attenuation along the medial aspect of the left upper extremity which may represent an evolving hematoma. An infectious or neoplastic process cannot be excluded. MRI  correlation is recommended. 2. Moderate severity cellulitis along the lateral aspect of the right hip and right hemipelvis. 3. Sigmoid diverticulosis without evidence of diverticulitis. 4. 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 5. Aortic atherosclerosis. Aortic Atherosclerosis (ICD10-I70.0). Electronically Signed   By: Aram Candela M.D.   On: 05/09/2020 16:16        Scheduled Meds: . artificial tears   Left Eye QHS  . busPIRone  30 mg Oral BID  . Chlorhexidine Gluconate Cloth  6 each Topical Daily  . donepezil  10 mg Oral QHS  . fentaNYL      . insulin aspart  0-9 Units Subcutaneous Q4H  . lidocaine      . linaclotide  145 mcg Oral Daily  . memantine  28 mg Oral Daily  . midazolam      . polyvinyl alcohol  2 drop Left Eye QID  . senna-docusate  1 tablet Oral QHS   Continuous Infusions: . cefTRIAXone (ROCEPHIN)  IV 1 g (05/10/20 0854)  . vancomycin Stopped (05/09/20 2219)     LOS: 5 days   Time spent= 35 mins    Shearon Balo, RN  NP-Student   If 7PM-7AM, please contact night-coverage  05/10/2020, 12:12 PM

## 2020-05-10 NOTE — Sedation Documentation (Signed)
Patient is resting comfortably. 

## 2020-05-11 LAB — PREPARE RBC (CROSSMATCH)

## 2020-05-11 LAB — GLUCOSE, CAPILLARY
Glucose-Capillary: 95 mg/dL (ref 70–99)
Glucose-Capillary: 121 mg/dL — ABNORMAL HIGH (ref 70–99)
Glucose-Capillary: 109 mg/dL — ABNORMAL HIGH (ref 70–99)
Glucose-Capillary: 95 mg/dL (ref 70–99)
Glucose-Capillary: 132 mg/dL — ABNORMAL HIGH (ref 70–99)

## 2020-05-11 LAB — BASIC METABOLIC PANEL
Anion gap: 8 (ref 5–15)
BUN: 10 mg/dL (ref 8–23)
CO2: 25 mmol/L (ref 22–32)
Calcium: 8.2 mg/dL — ABNORMAL LOW (ref 8.9–10.3)
Chloride: 106 mmol/L (ref 98–111)
Creatinine, Ser: 0.7 mg/dL (ref 0.44–1.00)
GFR calc Af Amer: >60 mL/min (ref >60)
GFR calc non Af Amer: >60 mL/min (ref >60)
Glucose, Bld: 99 mg/dL (ref 70–99)
Potassium: 3.6 mmol/L (ref 3.5–5.1)
Sodium: 139 mmol/L (ref 135–145)

## 2020-05-11 LAB — CBC
HCT: 21.3 % — ABNORMAL LOW (ref 36.0–46.0)
Hemoglobin: 6.5 g/dL — CL (ref 12.0–15.0)
MCH: 30.8 pg (ref 26.0–34.0)
MCHC: 30.5 g/dL (ref 30.0–36.0)
MCV: 100.9 fL — ABNORMAL HIGH (ref 80.0–100.0)
Platelets: 152 10*3/uL (ref 150–400)
RBC: 2.11 MIL/uL — ABNORMAL LOW (ref 3.87–5.11)
RDW: 15.9 % — ABNORMAL HIGH (ref 11.5–15.5)
WBC: 8.8 10*3/uL (ref 4.0–10.5)
nRBC: 4 % — ABNORMAL HIGH (ref 0.0–0.2)

## 2020-05-11 LAB — HEMOGLOBIN AND HEMATOCRIT, BLOOD
HCT: 29.7 % — ABNORMAL LOW (ref 36.0–46.0)
Hemoglobin: 9.7 g/dL — ABNORMAL LOW (ref 12.0–15.0)

## 2020-05-11 MED ORDER — CEPHALEXIN 500 MG PO CAPS
500.0000 mg | ORAL_CAPSULE | Freq: Three times a day (TID) | ORAL | Status: DC
  Administered 2020-05-11 – 2020-05-13 (×7): 500 mg via ORAL
  Filled 2020-05-11 (×7): qty 1

## 2020-05-11 MED ORDER — SODIUM CHLORIDE 0.9% IV SOLUTION: Freq: Once | INTRAVENOUS | Status: AC

## 2020-05-11 MED ORDER — ACETAMINOPHEN 325 MG PO TABS
650.0000 mg | ORAL_TABLET | Freq: Once | ORAL | Status: AC
  Administered 2020-05-11: 650 mg via ORAL
  Filled 2020-05-11: qty 2

## 2020-05-11 MED ORDER — DIPHENHYDRAMINE HCL 50 MG/ML IJ SOLN
25.0000 mg | Freq: Once | INTRAMUSCULAR | Status: AC
  Administered 2020-05-11: 25 mg via INTRAVENOUS
  Filled 2020-05-11: qty 1

## 2020-05-11 MED ORDER — FUROSEMIDE 10 MG/ML IJ SOLN
20.0000 mg | Freq: Once | INTRAMUSCULAR | Status: AC
  Administered 2020-05-11: 20 mg via INTRAVENOUS
  Filled 2020-05-11: qty 2

## 2020-05-11 NOTE — Progress Notes (Signed)
Called by RN. Hgb is 6.5 this am. No active bleeding Transfuse 2 units PRBC. Check H/H after transfusions.

## 2020-05-11 NOTE — Progress Notes (Signed)
PROGRESS NOTE    [name]  SFK:[id number] DOB: 03-04-1941 DOA: 05/05/2020 PCP: [name], MD   Brief Narrative:  79 year old with history of CVA in 02/2020, Takotsubo cardiomyopathy, DVT/PE, HTN, obstructive sleep apnea admitted for syncopal episode. Found to have submassive PE with saddle emboli and right heart strain. Had elevated troponin and cardiology was consulted who did not suspect ACS. PCCM consulted and patient taken to the ICU where she received TPA.  Complicated by left arm hematoma at the infusion site with some blood loss anemia requiring transfusion.  Due to persistent drop in hemoglobin, anticoagulation was discontinued.  CT abdomen pelvis did not show any retroperitoneal bleed which showed evidence of left upper extremity hematoma and right hip cellulitis.  IVC filter placed on 05/10/2020.   Assessment & Plan:   Principal Problem:   Elevated troponin Active Problems:   Acute massive pulmonary embolism (HCC)   Takotsubo cardiomyopathy   History of DVT (deep vein thrombosis)   Dementia (HCC)   Near syncope   Pulmonary embolism (HCC)   Cellulitis of right hip  Large bilateral acute pulmonary embolism with saddle emboli/at least submassive PE with cor pulmonale -Status post TPA and initially on heparin drip but complicated by left upper extremity hematoma/bleeding therefore heparin drip discontinued.  IVC filter placed on 05/10/2020. -Supplemental oxygen. Supportive care. -Lower extremity Dopplers-bilateral DVTs -Echocardiogram shows EF of 60% with some right heart heart strain and tricuspid regurgitation. Will likely repeat echocardiogram in a few weeks as outpatient  Left upper extremity pain, improved according to her -Hematoma seen on the ultrasound.  Hemoglobin has been discontinued for now. -Advised to keep her arm elevated.  Bowel regimen and pain medication prescribed.  Acute blood loss anemia Low back pain, chronic but will rule out retroperitoneal  bleed -CT abdomen pelvis is negative for retroperitoneal bleed.  Due to drop in hemoglobin we will transfuse 2 more units of PRBC today.  Hemoglobin this morning 6.5.  Dysuria with urinary tract infection, E. coli -Urine cultures growing pansensitive E. coli, transition IV Rocephin to 3 more days of oral Keflex.  Right hip cellulitis without purulent discharge -Continue IV vancomycin for now.  We will plan for at least 5 to 7 days of treatment.  Syncope secondary to obstructive shock -CT head negative. Rest of the work-up as indicated above  Elevated troponin -Likely from underlying PE.  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  Macrocytic anemia -Hemoglobin stable  History of CVA -Currently on Plavix and statin. Plavix currently on hold.   Dementia/chronic anxiety -Continue home meds-Namenda, BuSpar  We will try and get PT/OT to see her.  Out of bed to chair as much as possible.  DVT prophylaxis: Heparin drip Code Status: Full code Family Communication: None at bedside  Status is: Inpatient  Remains inpatient appropriate because:Hemodynamically unstable   Dispo:  Patient From: Home  Planned Disposition: To be determined  Expected discharge date: 05/12/20  Medically stable for discharge: No. Due to massive pulmonary embolism with right heart strain, continues to remain on heparin drip.  Body mass index is 36.17 kg/m.    Subjective: Patient seen and examined at bedside this morning.  Reporting of some upper extremity discomfort but denies any tingling, numbness.  The swelling of the upper extremity still persist.  Denies pain anywhere else.  Denies shortness of breath as well.  Tolerated her IVC filter placement well yesterday.   Examination: Constitutional: Not in  acute distress, 2 L nasal cannula Respiratory: Clear to auscultation bilaterally Cardiovascular: Normal sinus  rhythm, no rubs Abdomen: Nontender nondistended good bowel sounds Musculoskeletal: Left upper extremity swelling noted with ecchymosis and signs of hematoma.  Good perfusion and sensation. Skin: No rashes seen Neurologic: CN 2-12 grossly intact.  And nonfocal Psychiatric: Normal judgment and insight. Alert and oriented x 3. Normal mood.  Objective: Vitals:   05/11/20 0839 05/11/20 1044 05/11/20 1110 05/11/20 1202  BP:  (!) 115/53 (!) 114/56 (!) 107/59  Pulse: (!) 59 (!) 57 (!) 55 (!) 57  Resp: 17   18  Temp: 98.3 F (36.8 C) 98.3 F (36.8 C) 98 F (36.7 C) 98.3 F (36.8 C)  TempSrc:  Oral  Oral  SpO2:  99% 100% 99%  Weight:      Height:        Intake/Output Summary (Last 24 hours) at 05/11/2020 1206 Last data filed at 05/11/2020 1046 Gross per 24 hour  Intake 810 ml  Output 1725 ml  Net -915 ml   Filed Weights   05/07/20 0534 05/09/20 0453 05/10/20 0442  Weight: 86.9 kg 88.4 kg 89.7 kg     Data Reviewed:   CBC: Recent Labs  Lab 05/07/20 0211 05/08/20 0303 05/09/20 0333 05/09/20 2030 05/11/20 0243  WBC 9.9 12.9* 12.6* 13.7* 8.8  HGB 8.0* 8.0* 6.8* 8.1* 6.5*  HCT 26.1* 25.9* 21.8* 25.0* 21.3*  MCV 100.4* 100.8* 102.8* 97.7 100.9*  PLT 146* 180 PLATELET CLUMPS NOTED ON SMEAR, UNABLE TO ESTIMATE 181 152   Basic Metabolic Panel: Recent Labs  Lab 05/05/20 1429 05/05/20 1429 05/05/20 2018 05/06/20 0123 05/09/20 1832 05/10/20 0425 05/11/20 0243  NA 145   < > 145 142 137 139 139  K 3.2*   < > 4.4 4.2 4.0 3.8 3.6  CL 109  --   --  104 104 105 106  CO2 25  --   --  25 24 24 25   GLUCOSE 161*  --   --  101* 153* 110* 99  BUN 9  --   --  10 12 11 10   CREATININE 1.13*  --   --  0.83 0.82 0.72 0.70  CALCIUM 8.9  --   --  9.0 8.4* 8.4* 8.2*  MG  --   --   --  2.0  --   --   --   PHOS  --   --   --  3.9  --   --   --    < > = values in this interval not displayed.   GFR: Estimated Creatinine Clearance: 59.3 mL/min (by C-G formula based on SCr of 0.7  mg/dL). Liver Function Tests: No results for input(s): AST, ALT, ALKPHOS, BILITOT, PROT, ALBUMIN in the last 168 hours. No results for input(s): LIPASE, AMYLASE in the last 168 hours. No results for input(s): AMMONIA in the last 168 hours. Coagulation Profile: Recent Labs  Lab 05/06/20 0123  INR 1.6*   Cardiac Enzymes: No results for input(s): CKTOTAL, CKMB, CKMBINDEX, TROPONINI in the last 168 hours. BNP (last 3 results) No results for input(s): PROBNP in the last 8760 hours. HbA1C: No results for input(s): HGBA1C in the last 72 hours. CBG: Recent Labs  Lab 05/10/20 1319 05/10/20 1605 05/10/20 2031 05/11/20 0410 05/11/20 0815  GLUCAP 87 130* 120* 95 121*   Lipid Profile: No results for input(s): CHOL, HDL, LDLCALC, TRIG, CHOLHDL, LDLDIRECT in the last 72 hours. Thyroid Function Tests: No results for input(s): TSH,  T4TOTAL, FREET4, T3FREE, THYROIDAB in the last 72 hours. Anemia Panel: No results for input(s): VITAMINB12, FOLATE, FERRITIN, TIBC, IRON, RETICCTPCT in the last 72 hours. Sepsis Labs: No results for input(s): PROCALCITON, LATICACIDVEN in the last 168 hours.  Recent Results (from the past 240 hour(s))  Respiratory Panel by RT PCR (Flu A&B, Covid) - Nasopharyngeal Swab     Status: None   Collection Time: 05/05/20  4:18 PM   Specimen: Nasopharyngeal Swab  Result Value Ref Range Status   SARS Coronavirus 2 by RT PCR NEGATIVE NEGATIVE Final    Comment: (NOTE) SARS-CoV-2 target nucleic acids are NOT DETECTED.  The SARS-CoV-2 RNA is generally detectable in upper respiratoy specimens during the acute phase of infection. The lowest concentration of SARS-CoV-2 viral copies this assay can detect is 131 copies/mL. A negative result does not preclude SARS-Cov-2 infection and should not be used as the sole basis for treatment or other patient management decisions. A negative result may occur with  improper specimen collection/handling, submission of specimen  other than nasopharyngeal swab, presence of viral mutation(s) within the areas targeted by this assay, and inadequate number of viral copies (<131 copies/mL). A negative result must be combined with clinical observations, patient history, and epidemiological information. The expected result is Negative.  Fact Sheet for Patients:  https://www.moore.com/  Fact Sheet for Healthcare Providers:  https://www.young.biz/  This test is no t yet approved or cleared by the [place] FDA and  has been authorized for detection and/or diagnosis of SARS-CoV-2 by FDA under an Emergency Use Authorization (EUA). This EUA will remain  in effect (meaning this test can be used) for the duration of the COVID-19 declaration under Section 564(b)(1) of the Act, 21 U.S.C. section 360bbb-3(b)(1), unless the authorization is terminated or revoked sooner.     Influenza A by PCR NEGATIVE NEGATIVE Final   Influenza B by PCR NEGATIVE NEGATIVE Final    Comment: (NOTE) The Xpert Xpress SARS-CoV-2/FLU/RSV assay is intended as an aid in  the diagnosis of influenza from Nasopharyngeal swab specimens and  should not be used as a sole basis for treatment. Nasal washings and  aspirates are unacceptable for Xpert Xpress SARS-CoV-2/FLU/RSV  testing.  Fact Sheet for Patients: https://www.moore.com/  Fact Sheet for Healthcare Providers: https://www.young.biz/  This test is not yet approved or cleared by the [place] FDA and  has been authorized for detection and/or diagnosis of SARS-CoV-2 by  FDA under an Emergency Use Authorization (EUA). This EUA will remain  in effect (meaning this test can be used) for the duration of the  Covid-19 declaration under Section 564(b)(1) of the Act, 21  U.S.C. section 360bbb-3(b)(1), unless the authorization is  terminated or revoked. Performed at [facility], [address],  [place], [place] [address]   MRSA PCR Screening     Status: None   Collection Time: 05/05/20 11:21 PM   Specimen: Nasopharyngeal  Result Value Ref Range Status   MRSA by PCR NEGATIVE NEGATIVE Final    Comment:        The GeneXpert MRSA Assay (FDA approved for NASAL specimens only), is one component of a comprehensive MRSA colonization surveillance program. It is not intended to diagnose MRSA infection nor to guide or monitor treatment for MRSA infections. Performed at [facility], [address], [place], [place] [address]   Culture, Urine     Status: Abnormal   Collection Time: 05/08/20 10:12 AM   Specimen: Urine, Catheterized  Result Value Ref Range  Status   Specimen Description URINE, CATHETERIZED  Final   Special Requests   Final    NONE Performed at [facility], [address] [place], [place] [address]    Culture >=100,000 COLONIES/mL ESCHERICHIA COLI (A)  Final   Report Status 05/10/2020 FINAL  Final   Organism ID, Bacteria ESCHERICHIA COLI (A)  Final      Susceptibility   Escherichia coli - MIC*    AMPICILLIN <=2 SENSITIVE Sensitive     CEFAZOLIN <=4 SENSITIVE Sensitive     CEFTRIAXONE <=0.25 SENSITIVE Sensitive     CIPROFLOXACIN <=0.25 SENSITIVE Sensitive     GENTAMICIN <=1 SENSITIVE Sensitive     IMIPENEM <=0.25 SENSITIVE Sensitive     NITROFURANTOIN <=16 SENSITIVE Sensitive     TRIMETH/SULFA <=20 SENSITIVE Sensitive     AMPICILLIN/SULBACTAM <=2 SENSITIVE Sensitive     PIP/TAZO <=4 SENSITIVE Sensitive     * >=100,000 COLONIES/mL ESCHERICHIA COLI         Radiology Studies: CT ABDOMEN PELVIS WO CONTRAST  Result Date: 05/09/2020 CLINICAL DATA:  Abdominal distension and lower back pain. EXAM: CT ABDOMEN AND PELVIS WITHOUT CONTRAST TECHNIQUE: Multidetector CT imaging of the abdomen and pelvis was performed following the standard protocol without IV contrast. COMPARISON:  None. FINDINGS: Lower chest: Very mild atelectasis is seen within the posterior  aspects of the bilateral lung bases. Hepatobiliary: No focal liver abnormality is seen. The gallbladder is moderately distended. No gallstones, gallbladder wall thickening, or biliary dilatation. Pancreas: Unremarkable. No pancreatic ductal dilatation or surrounding inflammatory changes. Spleen: Normal in size without focal abnormality. Adrenals/Urinary Tract: Adrenal glands are unremarkable. Kidneys are normal, without renal calculi, focal lesion, or hydronephrosis. Bladder is unremarkable. Stomach/Bowel: Stomach is within normal limits. Appendix appears normal. No evidence of bowel wall thickening, distention, or inflammatory changes. Noninflamed diverticula are seen within the sigmoid colon. Vascular/Lymphatic: There is moderate severity calcification and tortuosity of the abdominal aorta and bilateral common iliac arteries, without evidence of aneurysmal dilatation. No enlarged abdominal or pelvic lymph nodes. Reproductive: Uterus and bilateral adnexa are unremarkable. Other: No abdominal wall hernia or abnormality. No abdominopelvic ascites. Musculoskeletal: A 5.8 cm x 6.2 cm area (incompletely imaged) of intramuscular low attenuation is seen along the medial aspect of the left upper extremity. This is adjacent to the mid and distal portions of the left humeral shaft (axial CT images 81 through 18, CT series number 3). A moderate amount of adjacent deep subcutaneous inflammatory fat stranding is seen. Moderate severity subcutaneous inflammatory fat stranding is seen along the lateral aspect of the right hip and right hemipelvis. There is marked severity levoscoliosis of the lumbar spine. Bilateral metallic density pedicle screws are seen from the levels of L2 through S1. An extensive amount of associated streak artifact is seen. Subsequently limited evaluation of the contents of the abdomen and pelvis is noted. IMPRESSION: 1. Large area of intramuscular low attenuation along the medial aspect of the left upper  extremity which may represent an evolving hematoma. An infectious or neoplastic process cannot be excluded. MRI correlation is recommended. 2. Moderate severity cellulitis along the lateral aspect of the right hip and right hemipelvis. 3. Sigmoid diverticulosis without evidence of diverticulitis. 4. 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 5. Aortic atherosclerosis. Aortic Atherosclerosis (ICD10-I70.0). Electronically Signed   By: Aram Candela M.D.   On: 05/09/2020 16:16   IR IVC FILTER PLMT / S&I Lenise Arena GUID/MOD SED  Result Date: 05/10/2020 CLINICAL DATA:  79 year old female  with history of DVT, PE, and large left upper extremity hematoma. EXAM: 1. ULTRASOUND GUIDANCE FOR VASCULAR ACCESS OF THE RIGHT internal jugular VEIN. 2. IVC VENOGRAM. 3. PERCUTANEOUS IVC FILTER PLACEMENT. ANESTHESIA/SEDATION: 1 mg IV Versed; 75 mcg IV Fentanyl. Total Moderate Sedation Time Thirteenminutes. CONTRAST:  98mL OMNIPAQUE IOHEXOL 300 MG/ML  SOLN FLUOROSCOPY TIME:  4 minutes, 24 seconds, 134 mGy PROCEDURE: The procedure, risks, benefits, and alternatives were explained to the patient. Questions regarding the procedure were encouraged and answered. The patient understands and consents to the procedure. The patient was prepped with Betadine in a sterile fashion, and a sterile drape was applied covering the operative field. A sterile gown and sterile gloves were used for the procedure. Local anesthesia was provided with 1% Lidocaine. Under direct ultrasound guidance, a 21 gauge needle was advanced into the right internal jugular vein with ultrasound image documentation performed. After securing access with a micropuncture dilator, a guidewire was advanced into the inferior vena cava. A deployment sheath was advanced over the guidewire. This was utilized to perform IVC venography. The deployment sheath was further positioned in an appropriate location for filter deployment. A Denali  IVC filter was then advanced in the sheath. This was then fully deployed in the infrarenal IVC. Final filter position was confirmed with a fluoroscopic spot image. Contrast injection was also performed through the sheath under fluoroscopy to confirm patency of the IVC at the level of the filter. The apex of the filter with was against the left lateral wall, just inferior to the left renal vein. Therefore the indwelling filter deployment sheath was exchanged for an 11 French coaxial retrieval sheath system, through which a loop snare was used to capture the apex hook and reposition the filter to a more and line position with the infrarenal segment of the inferior vena cava. Repeat contrast injection through the indwelling sheath demonstrated better positioning of the filter. After the procedure the sheath was removed and hemostasis obtained with manual compression. COMPLICATIONS: None. FINDINGS: IVC venography demonstrates a normal caliber IVC with no evidence of thrombus. Renal veins are identified bilaterally. The IVC filter was successfully positioned below the level of the renal veins and is appropriately oriented. This IVC filter has both permanent and retrievable indications. IMPRESSION: Placement of percutaneous IVC filter in infrarenal IVC. IVC venogram shows no evidence of IVC thrombus and normal caliber of the inferior vena cava. This filter does have both permanent and retrievable indications. PLAN: This IVC filter is potentially retrievable. The patient will be assessed for filter retrieval by Interventional Radiology in approximately 8-12 weeks. Further recommendations regarding filter retrieval, continued surveillance or declaration of device permanence, will be made at that time. Marliss Coots, MD [facility] Electronically Signed   By: Marliss Coots MD   On: 05/10/2020 13:09        Scheduled Meds: . artificial tears   Left Eye QHS  .  busPIRone  30 mg Oral BID  . Chlorhexidine Gluconate Cloth  6 each Topical Daily  . donepezil  10 mg Oral QHS  . insulin aspart  0-9 Units Subcutaneous Q4H  . linaclotide  145 mcg Oral Daily  . memantine  28 mg Oral Daily  . polyvinyl alcohol  2 drop Left Eye QID  . senna-docusate  1 tablet Oral QHS   Continuous Infusions: . cefTRIAXone (ROCEPHIN)  IV 1 g (05/11/20 0854)  . vancomycin 1,250 mg (05/10/20 1833)     LOS: 6 days   Time  spent= 35 mins    Shila Kruczek Joline Maxcy, MD [facility]  If 7PM-7AM, please contact night-coverage  05/11/2020, 12:06 PM

## 2020-05-11 NOTE — Evaluation (Signed)
Physical Therapy Evaluation Patient Details Name: [name] MRN: [id number] DOB: 02/10/1941 Today's Date: 05/11/2020   History of Present Illness  79 year old with history of CVA in 02/2020, Takotsubo cardiomyopathy, DVT/PE, HTN, obstructive sleep apnea admitted for syncopal episode. Found to have submassive PE with saddle emboli and right heart strain. Had elevated troponin and cardiology was consulted who did not suspect ACS. PCCM consulted and patient taken to the ICU where she received TPA.  Complicated by left arm hematoma at the infusion site with some blood loss anemia requiring transfusion.  Due to persistent drop in hemoglobin, anticoagulation was discontinued.  CT abdomen pelvis did not show any retroperitoneal bleed which showed evidence of left upper extremity hematoma and right hip cellulitis.  IVC filter placed on 05/10/2020.  Clinical Impression  Pt presents to PT with deficits in functional mobility, strength, power, endurance, gait, and safety awareness. Pt with generalized weakness in RUE and BLE but with more significant weakness and edema in LUE at this time. LUE weakness limits the pt's ability to utilize a RW for transfers as she is unable to elevate this extremity without assistance and lacks sufficient strength to WB through this arm. Pt will benefit from aggressive inpatient therapies to improve strength and mobility quality and to reduce falls risk. PT recommends discharge to CIR as the pt is highly motivated, has 24/7 caregiver support, and demonstrates the potential to return to a supervision level.    Follow Up Recommendations CIR    Equipment Recommendations   (mechanical lift if home today)    Recommendations for Other Services Rehab consult     Precautions / Restrictions Precautions Precautions: Fall Restrictions Weight Bearing Restrictions: No      Mobility  Bed Mobility Overal bed mobility: Needs Assistance Bed Mobility: Supine to Sit     Supine  to sit: Mod assist        Transfers Overall transfer level: Needs assistance Equipment used: 1 person hand held assist;Rolling walker (2 wheeled) Transfers: Sit to/from [employer] Sit to Stand: Max assist Stand pivot transfers: Max assist       General transfer comment: pt performs 2 sit to stands with PT BUE support and knee block, one stand with use of RW but poor ability to push through LUE at this time. PT facilitates weight shift during SPT  Ambulation/Gait                Stairs            Wheelchair Mobility    Modified Rankin (Stroke Patients Only)       Balance Overall balance assessment: Needs assistance Sitting-balance support: No upper extremity supported;Feet supported Sitting balance-Leahy Scale: Poor Sitting balance - Comments: minA due to posterior lean Postural control: Posterior lean Standing balance support: Bilateral upper extremity supported Standing balance-Leahy Scale: Zero Standing balance comment: mod-maxA for static standing balance, posterior lean                             Pertinent Vitals/Pain Pain Assessment: Faces Faces Pain Scale: Hurts even more Pain Location: LUE Pain Descriptors / Indicators: Sore Pain Intervention(s): Monitored during session    Home Living Family/patient expects to be discharged to:: Private residence Living Arrangements: Other relatives Available Help at Discharge: Family;Available 24 hours/day Type of Home: House Home Access: Level entry     Home Layout: One level Home Equipment: Walker - 2 wheels;Wheelchair - [employer]  Prior Function Level of Independence: Needs assistance   Gait / Transfers Assistance Needed: pt ambulates with minG/supervision with RW since D/C from rehab  ADL's / Homemaking Assistance Needed: pt requires assistance with ADLs        Hand Dominance   Dominant Hand: Left    Extremity/Trunk Assessment   Upper  Extremity Assessment Upper Extremity Assessment: LUE deficits/detail;RUE deficits/detail RUE Deficits / Details: grossly 4-/5 LUE Deficits / Details: edematous, 3-/5 shoulder flexion, elbow flexion limited by swelling, grip 3/5    Lower Extremity Assessment Lower Extremity Assessment: Generalized weakness    Cervical / Trunk Assessment Cervical / Trunk Assessment: Kyphotic  Communication   Communication:  (dysarthric, near baseline)  Cognition Arousal/Alertness: Awake/alert Behavior During Therapy: WFL for tasks assessed/performed Overall Cognitive Status: Impaired/Different from baseline Area of Impairment: Problem solving;Safety/judgement;Awareness                         Safety/Judgement: Decreased awareness of safety Awareness: Emergent Problem Solving: Slow processing        General Comments General comments (skin integrity, edema, etc.): VSS, pt initially on 1L [place], PT weans to RA with stable sats in mid to upper 90s    Exercises     Assessment/Plan    PT Assessment Patient needs continued PT services  PT Problem List Decreased strength;Decreased activity tolerance;Decreased balance;Decreased mobility;Decreased knowledge of use of DME;Decreased safety awareness;Decreased knowledge of precautions;Cardiopulmonary status limiting activity;Pain       PT Treatment Interventions DME instruction;Gait training;Functional mobility training;Therapeutic activities;Therapeutic exercise;Balance training;Neuromuscular re-education;Patient/family education    PT Goals (Current goals can be found in the Care Plan section)  Acute Rehab PT Goals Patient Stated Goal: To get stronger and return to her prior level of function PT Goal Formulation: With patient/family Time For Goal Achievement: 05/25/20 Potential to Achieve Goals: Fair    Frequency Min 3X/week   Barriers to discharge        Co-evaluation               AM-PAC PT "6 Clicks" Mobility  Outcome  Measure Help needed turning from your back to your side while in a flat bed without using bedrails?: A Lot Help needed moving from lying on your back to sitting on the side of a flat bed without using bedrails?: A Lot Help needed moving to and from a bed to a chair (including a wheelchair)?: Total Help needed standing up from a chair using your arms (e.g., wheelchair or bedside chair)?: Total Help needed to walk in hospital room?: Total Help needed climbing 3-5 steps with a railing? : Total 6 Click Score: 8    End of Session Equipment Utilized During Treatment: Oxygen Activity Tolerance: Patient tolerated treatment well Patient left: in chair;with call bell/phone within reach;with chair alarm set Nurse Communication: Mobility status;Need for lift equipment PT Visit Diagnosis: Other abnormalities of gait and mobility (R26.89);Muscle weakness (generalized) (M62.81);Pain Pain - Right/Left: Left Pain - part of body: Arm    Time: 1716-1745 PT Time Calculation (min) (ACUTE ONLY): 29 min   Charges:   PT Evaluation $PT Eval Moderate Complexity: 1 Mod PT Treatments $Therapeutic Activity: 8-22 mins        Arlyss Gandy, PT, DPT Acute Rehabilitation Pager: [phone]   Arlyss Gandy 05/11/2020, 5:58 PM

## 2020-05-11 NOTE — Progress Notes (Signed)
Mobility Specialist: Progress Note   05/11/20 1526  Mobility  Activity Dangled on edge of bed  Level of Assistance Moderate assist, patient does 50-74%  Assistive Device None  Mobility Response Tolerated well  Mobility performed by Mobility specialist  Bed Position Semi-fowlers  $Mobility charge 1 Mobility   Pre-Mobility: 62 HR, 122/62 BP, 98% SpO2 Post-Mobility: 59 HR, 126/63 BP, 99% SpO2  Pt was mod assist to EOB and mod-max assist sitting on EOB. Pt required frequent verbal cues to use her arms and core to hold herself up while siting on EOB. Pt performed 3 sets of 10 leg ext. On each leg and 3 sets of 10 reps of arm raises on R arm. I encouraged pt to continue mobility in bed as tolerated. Pt is very motivated.   [facility] Mobility Specialist

## 2020-05-12 ENCOUNTER — Ambulatory Visit: Payer: Medicare Other | Admitting: Podiatry

## 2020-05-12 LAB — BASIC METABOLIC PANEL
Anion gap: 10 (ref 5–15)
BUN: 11 mg/dL (ref 8–23)
CO2: 25 mmol/L (ref 22–32)
Calcium: 8.5 mg/dL — ABNORMAL LOW (ref 8.9–10.3)
Chloride: 106 mmol/L (ref 98–111)
Creatinine, Ser: 0.72 mg/dL (ref 0.44–1.00)
GFR calc Af Amer: >60 mL/min (ref >60)
GFR calc non Af Amer: >60 mL/min (ref >60)
Glucose, Bld: 87 mg/dL (ref 70–99)
Potassium: 3.5 mmol/L (ref 3.5–5.1)
Sodium: 141 mmol/L (ref 135–145)

## 2020-05-12 LAB — TYPE AND SCREEN
ABO/RH(D): AB POS
Antibody Screen: NEGATIVE
Unit division: 0
Unit division: 0
Unit division: 0

## 2020-05-12 LAB — GLUCOSE, CAPILLARY
Glucose-Capillary: 106 mg/dL — ABNORMAL HIGH (ref 70–99)
Glucose-Capillary: 109 mg/dL — ABNORMAL HIGH (ref 70–99)
Glucose-Capillary: 83 mg/dL (ref 70–99)
Glucose-Capillary: 87 mg/dL (ref 70–99)
Glucose-Capillary: 87 mg/dL (ref 70–99)
Glucose-Capillary: 99 mg/dL (ref 70–99)

## 2020-05-12 LAB — CBC
HCT: 31.8 % — ABNORMAL LOW (ref 36.0–46.0)
Hemoglobin: 9.9 g/dL — ABNORMAL LOW (ref 12.0–15.0)
MCH: 30.5 pg (ref 26.0–34.0)
MCHC: 31.1 g/dL (ref 30.0–36.0)
MCV: 97.8 fL (ref 80.0–100.0)
Platelets: 152 10*3/uL (ref 150–400)
RBC: 3.25 MIL/uL — ABNORMAL LOW (ref 3.87–5.11)
RDW: 16.8 % — ABNORMAL HIGH (ref 11.5–15.5)
WBC: 11 10*3/uL — ABNORMAL HIGH (ref 4.0–10.5)
nRBC: 2.1 % — ABNORMAL HIGH (ref 0.0–0.2)

## 2020-05-12 LAB — BPAM RBC
Blood Product Expiration Date: 202110252359
Blood Product Expiration Date: 202110302359
Blood Product Expiration Date: 202110312359
ISSUE DATE / TIME: 202109281044
ISSUE DATE / TIME: 202109300551
ISSUE DATE / TIME: 202109301039
Unit Type and Rh: 6200
Unit Type and Rh: 8400
Unit Type and Rh: 8400

## 2020-05-12 LAB — MAGNESIUM: Magnesium: 2 mg/dL (ref 1.7–2.4)

## 2020-05-12 MED ORDER — DIPHENHYDRAMINE HCL 25 MG PO CAPS
25.0000 mg | ORAL_CAPSULE | Freq: Once | ORAL | Status: AC
  Administered 2020-05-12: 25 mg via ORAL
  Filled 2020-05-12: qty 1

## 2020-05-12 MED ORDER — POTASSIUM CHLORIDE 20 MEQ/15ML (10%) PO SOLN
40.0000 meq | Freq: Once | ORAL | Status: AC
  Administered 2020-05-12: 40 meq via ORAL
  Filled 2020-05-12: qty 30

## 2020-05-12 NOTE — Evaluation (Addendum)
Occupational Therapy Evaluation Patient Details Name: [name] MRN: [id number] DOB: April 03, 1941 Today's Date: 05/12/2020    History of Present Illness Pt is 79 year old with history of CVA in 02/2020, Takotsubo cardiomyopathy, DVT/PE, HTN, obstructive sleep apnea admitted for syncopal episode. Found to have submassive PE with saddle emboli and right heart strain. Had elevated troponin and cardiology was consulted who did not suspect ACS. PCCM consulted and patient taken to the ICU where she received TPA.  Complicated by left arm hematoma at the infusion site with some blood loss anemia requiring transfusion.  Due to persistent drop in hemoglobin, anticoagulation was discontinued.  CT abdomen pelvis did not show any retroperitoneal bleed which showed evidence of left upper extremity hematoma and right hip cellulitis.  IVC filter placed on 05/10/2020.   Clinical Impression   Patient admitted with the above diagnosis.  Parents with fatigue (sitting in recliner since the morning time), LUE swelling and pain, decreased activity tolerance, inability to come to a stand, generalized weakness, baseline cognitive dysfunction.  All impact her self care and mobility status compared to her stated prior level.  At home the patient received assist from family for community mobility, ADL's, IADL's and mobility.  She was able to assist for UB ADL, light home management and light meal prep seated after setup.  OT assisted nursing tech with lift back to bed, and nursing tech providing care to the patient at bed level.  OT to continue to follow in the acute setting.  The patient may need SNF post acute rehab.  She is hoping to go home and have therapy there if needed.      Follow Up Recommendations  CIR    Equipment Recommendations  Other (comment) (patient has needed DME from prior admit)    Recommendations for Other Services       Precautions / Restrictions Precautions Precautions:  Fall Restrictions Weight Bearing Restrictions: No      Mobility Bed Mobility Overal bed mobility: Needs Assistance             General bed mobility comments: +2 to reposition in bed after placement via lift.  Transfers Overall transfer level: Needs assistance                                                               ADL either performed or assessed with clinical judgement   ADL Overall ADL's : Needs assistance/impaired Eating/Feeding: Set up;Sitting   Grooming: Wash/dry hands;Wash/dry face;Minimal assistance;Bed level   Upper Body Bathing: Maximal assistance;Bed level   Lower Body Bathing: Total assistance;Bed level   Upper Body Dressing : Maximal assistance;Bed level   Lower Body Dressing: Total assistance;Bed level       Toileting- Clothing Manipulation and Hygiene: Total assistance;Bed level       Functional mobility during ADLs: Total assistance General ADL Comments: maximove used for back to bed.     Vision Patient Visual Report: No change from baseline       Perception     Praxis      Pertinent Vitals/Pain Faces Pain Scale: Hurts even more Pain Location: LUE Pain Descriptors / Indicators: Sore;Guarding;Grimacing Pain Intervention(s): Monitored during session;Repositioned     Hand Dominance Left   Extremity/Trunk Assessment Upper Extremity Assessment Upper Extremity Assessment: Generalized weakness;LUE deficits/detail  LUE Deficits / Details: edematous, 3-/5 shoulder flexion, elbow flexion limited by swelling, grip 3/5 LUE: Unable to fully assess due to pain LUE Sensation: WNL LUE Coordination: decreased gross motor   Lower Extremity Assessment Lower Extremity Assessment: Defer to PT evaluation   Cervical / Trunk Assessment Cervical / Trunk Assessment: Kyphotic   Communication Communication Communication: No difficulties   Cognition Arousal/Alertness: Awake/alert Behavior During Therapy: WFL for tasks  assessed/performed Overall Cognitive Status: No family/caregiver present to determine baseline cognitive functioning                               Problem Solving: Slow processing;Difficulty sequencing;Decreased initiation                      Home Living Family/patient expects to be discharged to:: Private residence Living Arrangements: Other relatives Available Help at Discharge: Family;Available 24 hours/day Type of Home: House Home Access: Level entry     Home Layout: One level     Bathroom Shower/Tub: Door;Walk-in Human resources officer: Standard Bathroom Accessibility: Yes How Accessible: Accessible via walker Home Equipment: Walker - 2 wheels;Wheelchair - [employer];Shower seat   Additional Comments: granddaughter lives with patient      Prior Functioning/Environment Level of Independence: Needs assistance  Gait / Transfers Assistance Needed: pt ambulates with minG/supervision with RW since D/C from rehab ADL's / Homemaking Assistance Needed: Family assists with transfer to shower, assist with LB, back and backside.  patient was able to assist with UB and grooming seated.   Comments: granddaughter lives with pt and helps her with showers; per nursing, son assists with med prep, community mobility and IADLs. Could perform light home management and meal prep seated with setup.        OT Problem List: Decreased strength;Decreased range of motion;Decreased activity tolerance;Impaired balance (sitting and/or standing);Decreased safety awareness;Pain;Impaired UE functional use;Obesity;Increased edema      OT Treatment/Interventions: Self-care/ADL training;Therapeutic exercise;DME and/or AE instruction;Therapeutic activities;Patient/family education;Balance training    OT Goals(Current goals can be found in the care plan section) Acute Rehab OT Goals Patient Stated Goal: Hoping to go home and to therapy there OT Goal Formulation: With  patient Time For Goal Achievement: 06/27/20 Potential to Achieve Goals: Fair ADL Goals Pt Will Perform Grooming: with set-up;sitting Pt Will Perform Upper Body Bathing: with set-up;sitting Pt Will Perform Upper Body Dressing: with set-up;sitting Pt Will Transfer to Toilet: with min assist;stand pivot transfer Additional ADL Goal #1: Supervision for supine to sit with SR's as needed to increase independence with toilet skills and decreased caregiver burden.  OT Frequency: Min 2X/week   Barriers to D/C:    Patient is heavy assist.       Co-evaluation              AM-PAC OT "6 Clicks" Daily Activity     Outcome Measure Help from another person eating meals?: A Little Help from another person taking care of personal grooming?: A Little Help from another person toileting, which includes using toliet, bedpan, or urinal?: Total Help from another person bathing (including washing, rinsing, drying)?: Total Help from another person to put on and taking off regular upper body clothing?: A Lot Help from another person to put on and taking off regular lower body clothing?: Total 6 Click Score: 11   End of Session Nurse Communication: Other (comment) (returned to bed.)  Activity Tolerance: Patient limited by fatigue;Patient limited by  pain Patient left: in bed;with call bell/phone within reach;with nursing/sitter in room  OT Visit Diagnosis: Unsteadiness on feet (R26.81);Muscle weakness (generalized) (M62.81);History of falling (Z91.81);Pain Pain - Right/Left: Left Pain - part of body: Arm                Time: 8756-4332 OT Time Calculation (min): 21 min Charges:  OT General Charges $OT Visit: 1 Visit OT Evaluation $OT Eval Moderate Complexity: 1 Mod  05/12/2020  Rich, OTR/L  Acute Rehabilitation Services  Office:  [phone]   Suzanna Obey 05/12/2020, 3:23 PM

## 2020-05-12 NOTE — Progress Notes (Signed)
Mobility Specialist - Progress Note   05/12/20 1429  Mobility  Activity Transferred:  Chair to bed  Level of Assistance Total care  Assistive Device MaxiMove  Mobility Response Tolerated well  Mobility performed by Mobility specialist;Nurse tech  $Mobility charge 1 Mobility   Assisted NT in transferring pt back into bed. VSS.  Mamie Levers Mobility Specialist Mobility Specialist Phone: [phone]

## 2020-05-12 NOTE — Progress Notes (Signed)
Pharmacy Antibiotic Note  [name] is a 79 y.o. female admitted on with cellulitis.  Pharmacy has been consulted for Vancomycin dosing (she is also on keflex until 10/3 for ecoli UTI). Plans noted for 5-7 days of vancomycin -SCr = 0.7 -WBC= 11, afeb  Plan: Vancomycin 1250 mg iv Q 24 hours Will follow renal function, cultures and clinical progress Will consider vancomycin trough if continues past 5 days   Height: 5\' 2"  (157.5 cm) Weight: 88.1 kg (194 lb 3.6 oz) IBW/kg (Calculated) : 50.1  Temp (24hrs), Avg:98.2 F (36.8 C), Min:98 F (36.7 C), Max:98.6 F (37 C)  Recent Labs  Lab 05/06/20 0123 05/07/20 0211 05/08/20 0303 05/09/20 0333 05/09/20 1832 05/09/20 2030 05/10/20 0425 05/11/20 0243 05/12/20 0311  WBC 10.8*   < > 12.9* 12.6*  --  13.7*  --  8.8 11.0*  CREATININE 0.83  --   --   --  0.82  --  0.72 0.70 0.72   < > = values in this interval not displayed.    Estimated Creatinine Clearance: 58.8 mL/min (by C-G formula based on SCr of 0.72 mg/dL).    Allergies  Allergen Reactions  . T-Pa [Alteplase] Swelling    Does okay with it if premedicated with benadryl.  . Codeine Itching    07/12/20, PharmD Clinical Pharmacist **Pharmacist phone directory can now be found on [email or web address] ([email or web address]).  Listed under [facility].

## 2020-05-12 NOTE — Progress Notes (Signed)
PROGRESS NOTE    [name]  PNT:[id number] DOB: [birth year] DOA: 05/05/2020 PCP: [name], MD   Brief Narrative:  79 year old with history of CVA in 02/2020, Takotsubo cardiomyopathy, DVT/PE, HTN, obstructive sleep apnea admitted for syncopal episode. Found to have submassive PE with saddle emboli and right heart strain. Had elevated troponin and cardiology was consulted who did not suspect ACS. PCCM consulted and patient taken to the ICU where she received TPA.  Complicated by left arm hematoma at the infusion site with some blood loss anemia requiring transfusion.  Due to persistent drop in hemoglobin, anticoagulation was discontinued.  CT abdomen pelvis did not show any retroperitoneal bleed which showed evidence of left upper extremity hematoma and right hip cellulitis.  IVC filter placed on 05/10/2020.  PT consulted.   Assessment & Plan:   Principal Problem:   Elevated troponin Active Problems:   Acute massive pulmonary embolism (HCC)   Takotsubo cardiomyopathy   History of DVT (deep vein thrombosis)   Dementia (HCC)   Near syncope   Pulmonary embolism (HCC)   Cellulitis of right hip  Large bilateral acute pulmonary embolism with saddle emboli/at least submassive PE with cor pulmonale -Status post TPA and initially on heparin drip but complicated by left upper extremity hematoma/bleeding therefore heparin drip discontinued.  IVC filter placed on 05/10/2020. -Supplemental oxygen. Supportive care. -Lower extremity Dopplers-bilateral DVTs -Echocardiogram shows EF of 60% with some right heart heart strain and tricuspid regurgitation. Will likely repeat echocardiogram in a few weeks as outpatient  Left upper extremity pain, improved according to her -Hematoma seen on the ultrasound.  Advised to keep her arm elevated.  Pain medication and bowel regimen prescribed  Acute blood loss anemia Low back pain, chronic but will rule out retroperitoneal bleed -CT abdomen pelvis is  negative for retroperitoneal bleed.  After transfusion, hemoglobin is stable this morning.  Dysuria with urinary tract infection, E. coli -Urine cultures growing pansensitive E. coli, transition IV Rocephin to 3 more days of oral Keflex.  Last day 10/3  Right hip cellulitis without purulent discharge -Continue IV vancomycin for now.  We will plan for at least 5 to 7 days of treatment.  Last day 10/3  Syncope secondary to obstructive shock -CT head negative. Rest of the work-up as indicated above  Elevated troponin -Likely from underlying PE.  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  Macrocytic anemia -Hemoglobin stable  History of CVA -Currently on Plavix and statin. Plavix currently on hold.   Dementia/chronic anxiety -Continue home meds-Namenda, BuSpar  PT recommending CIR, consult placed  DVT prophylaxis: Heparin drip Code Status: Full code Family Communication: Called Randy  Status is: Inpatient  Remains inpatient appropriate because:Hemodynamically unstable   Dispo:  Patient From: Home  Planned Disposition: To be determined  Expected discharge date: 05/15/20  Medically stable for discharge: No. Due to massive pulmonary embolism with right heart strain, continues to remain on heparin drip.  Body mass index is 35.52 kg/m.    Subjective: When I saw the patient this morning she was working with physical therapy and had just been moved to the chair.  She did not have any complaints.  She was saturating greater than 95% on room air..  Quite weak but improving.  Examination: Constitutional: Not in acute distress, generally weak appearing Respiratory: Clear to auscultation bilaterally Cardiovascular: Normal sinus rhythm, no rubs Abdomen: Nontender nondistended good bowel sounds Musculoskeletal: No edema noted Skin: Soft tissue/lipoma  noted in her mid back.  Left upper extremity  hematoma and swelling. Neurologic: CN 2-12 grossly intact.  And nonfocal Psychiatric: Normal judgment and insight. Alert and oriented x 3. Normal mood.  Objective: Vitals:   05/12/20 0024 05/12/20 0417 05/12/20 0737 05/12/20 1150  BP: (!) 114/51 (!) 116/54 (!) 116/52 131/67  Pulse: 64 64 65 69  Resp: 18 18 18 18   Temp: 98 F (36.7 C) 98.2 F (36.8 C) 98.6 F (37 C) 99.4 F (37.4 C)  TempSrc: Oral Oral Oral Oral  SpO2: 99% 97% 94% 95%  Weight:  88.1 kg    Height:        Intake/Output Summary (Last 24 hours) at 05/12/2020 1247 Last data filed at 05/11/2020 2100 Gross per 24 hour  Intake 1035 ml  Output 400 ml  Net 635 ml   Filed Weights   05/09/20 0453 05/10/20 0442 05/12/20 0417  Weight: 88.4 kg 89.7 kg 88.1 kg     Data Reviewed:   CBC: Recent Labs  Lab 05/08/20 0303 05/08/20 0303 05/09/20 0333 05/09/20 2030 05/11/20 0243 05/11/20 1704 05/12/20 0311  WBC 12.9*  --  12.6* 13.7* 8.8  --  11.0*  HGB 8.0*   < > 6.8* 8.1* 6.5* 9.7* 9.9*  HCT 25.9*   < > 21.8* 25.0* 21.3* 29.7* 31.8*  MCV 100.8*  --  102.8* 97.7 100.9*  --  97.8  PLT 180  --  PLATELET CLUMPS NOTED ON SMEAR, UNABLE TO ESTIMATE 181 152  --  152   < > = values in this interval not displayed.   Basic Metabolic Panel: Recent Labs  Lab 05/06/20 0123 05/09/20 1832 05/10/20 0425 05/11/20 0243 05/12/20 0311  NA 142 137 139 139 141  K 4.2 4.0 3.8 3.6 3.5  CL 104 104 105 106 106  CO2 25 24 24 25 25   GLUCOSE 101* 153* 110* 99 87  BUN 10 12 11 10 11   CREATININE 0.83 0.82 0.72 0.70 0.72  CALCIUM 9.0 8.4* 8.4* 8.2* 8.5*  MG 2.0  --   --   --  2.0  PHOS 3.9  --   --   --   --    GFR: Estimated Creatinine Clearance: 58.8 mL/min (by C-G formula based on SCr of 0.72 mg/dL). Liver Function Tests: No results for input(s): AST, ALT, ALKPHOS, BILITOT, PROT, ALBUMIN in the last 168 hours. No results for input(s): LIPASE, AMYLASE in the last 168 hours. No results for input(s): AMMONIA in the last 168  hours. Coagulation Profile: Recent Labs  Lab 05/06/20 0123  INR 1.6*   Cardiac Enzymes: No results for input(s): CKTOTAL, CKMB, CKMBINDEX, TROPONINI in the last 168 hours. BNP (last 3 results) No results for input(s): PROBNP in the last 8760 hours. HbA1C: No results for input(s): HGBA1C in the last 72 hours. CBG: Recent Labs  Lab 05/11/20 2101 05/12/20 0020 05/12/20 0403 05/12/20 0740 05/12/20 1147  GLUCAP 132* 87 87 83 106*   Lipid Profile: No results for input(s): CHOL, HDL, LDLCALC, TRIG, CHOLHDL, LDLDIRECT in the last 72 hours. Thyroid Function Tests: No results for input(s): TSH, T4TOTAL, FREET4, T3FREE, THYROIDAB in the last 72 hours. Anemia Panel: No results for input(s): VITAMINB12, FOLATE, FERRITIN, TIBC, IRON, RETICCTPCT in the last 72 hours. Sepsis Labs: No results for input(s): PROCALCITON, LATICACIDVEN in the last 168 hours.  Recent Results (from the past 240 hour(s))  Respiratory Panel by RT PCR (Flu A&B, Covid) - Nasopharyngeal Swab     Status: None  Collection Time: 05/05/20  4:18 PM   Specimen: Nasopharyngeal Swab  Result Value Ref Range Status   SARS Coronavirus 2 by RT PCR NEGATIVE NEGATIVE Final    Comment: (NOTE) SARS-CoV-2 target nucleic acids are NOT DETECTED.  The SARS-CoV-2 RNA is generally detectable in upper respiratoy specimens during the acute phase of infection. The lowest concentration of SARS-CoV-2 viral copies this assay can detect is 131 copies/mL. A negative result does not preclude SARS-Cov-2 infection and should not be used as the sole basis for treatment or other patient management decisions. A negative result may occur with  improper specimen collection/handling, submission of specimen other than nasopharyngeal swab, presence of viral mutation(s) within the areas targeted by this assay, and inadequate number of viral copies (<131 copies/mL). A negative result must be combined with clinical observations, patient history, and  epidemiological information. The expected result is Negative.  Fact Sheet for Patients:  https://www.moore.com/  Fact Sheet for Healthcare Providers:  https://www.young.biz/  This test is no t yet approved or cleared by the [place] FDA and  has been authorized for detection and/or diagnosis of SARS-CoV-2 by FDA under an Emergency Use Authorization (EUA). This EUA will remain  in effect (meaning this test can be used) for the duration of the COVID-19 declaration under Section 564(b)(1) of the Act, 21 U.S.C. section 360bbb-3(b)(1), unless the authorization is terminated or revoked sooner.     Influenza A by PCR NEGATIVE NEGATIVE Final   Influenza B by PCR NEGATIVE NEGATIVE Final    Comment: (NOTE) The Xpert Xpress SARS-CoV-2/FLU/RSV assay is intended as an aid in  the diagnosis of influenza from Nasopharyngeal swab specimens and  should not be used as a sole basis for treatment. Nasal washings and  aspirates are unacceptable for Xpert Xpress SARS-CoV-2/FLU/RSV  testing.  Fact Sheet for Patients: https://www.moore.com/  Fact Sheet for Healthcare Providers: https://www.young.biz/  This test is not yet approved or cleared by the [place] FDA and  has been authorized for detection and/or diagnosis of SARS-CoV-2 by  FDA under an Emergency Use Authorization (EUA). This EUA will remain  in effect (meaning this test can be used) for the duration of the  Covid-19 declaration under Section 564(b)(1) of the Act, 21  U.S.C. section 360bbb-3(b)(1), unless the authorization is  terminated or revoked. Performed at [facility], [address] [place], [place] [address]   MRSA PCR Screening     Status: None   Collection Time: 05/05/20 11:21 PM   Specimen: Nasopharyngeal  Result Value Ref Range Status   MRSA by PCR NEGATIVE NEGATIVE Final    Comment:        The GeneXpert MRSA Assay  (FDA approved for NASAL specimens only), is one component of a comprehensive MRSA colonization surveillance program. It is not intended to diagnose MRSA infection nor to guide or monitor treatment for MRSA infections. Performed at [facility], [address] [place], [place] [address]   Culture, Urine     Status: Abnormal   Collection Time: 05/08/20 10:12 AM   Specimen: Urine, Catheterized  Result Value Ref Range Status   Specimen Description URINE, CATHETERIZED  Final   Special Requests   Final    NONE Performed at [facility], [address] [place], [place] [address]    Culture >=100,000 COLONIES/mL ESCHERICHIA COLI (A)  Final   Report Status 05/10/2020 FINAL  Final   Organism ID, Bacteria ESCHERICHIA COLI (A)  Final  Susceptibility   Escherichia coli - MIC*    AMPICILLIN <=2 SENSITIVE Sensitive     CEFAZOLIN <=4 SENSITIVE Sensitive     CEFTRIAXONE <=0.25 SENSITIVE Sensitive     CIPROFLOXACIN <=0.25 SENSITIVE Sensitive     GENTAMICIN <=1 SENSITIVE Sensitive     IMIPENEM <=0.25 SENSITIVE Sensitive     NITROFURANTOIN <=16 SENSITIVE Sensitive     TRIMETH/SULFA <=20 SENSITIVE Sensitive     AMPICILLIN/SULBACTAM <=2 SENSITIVE Sensitive     PIP/TAZO <=4 SENSITIVE Sensitive     * >=100,000 COLONIES/mL ESCHERICHIA COLI         Radiology Studies: No results found.      Scheduled Meds:  artificial tears   Left Eye QHS   busPIRone  30 mg Oral BID   cephALEXin  500 mg Oral Q8H   Chlorhexidine Gluconate Cloth  6 each Topical Daily   donepezil  10 mg Oral QHS   insulin aspart  0-9 Units Subcutaneous Q4H   linaclotide  145 mcg Oral Daily   memantine  28 mg Oral Daily   polyvinyl alcohol  2 drop Left Eye QID   senna-docusate  1 tablet Oral QHS   Continuous Infusions:  vancomycin 1,250 mg (05/11/20 1713)     LOS: 7 days   Time spent= 35 mins    Carlis Burnsworth Joline Maxcy, MD [facility]  If 7PM-7AM, please contact  night-coverage  05/12/2020, 12:47 PM

## 2020-05-12 NOTE — Progress Notes (Signed)
Inpatient Rehab Admissions:  Inpatient Rehab Consult received.  I met with patient at the bedside for rehabilitation assessment and to discuss goals and expectations of an inpatient rehab admission.  With min cuing she was able to recall previous admission for CVA and current admission for "blood clots".  I was able to speak with her son over the phone, and all are in agreement for pt to return to CIR when bed available.  Will continue to follow for timing.    Signed: Shann Medal, PT, DPT Admissions Coordinator [phone] 05/12/20  12:53 PM

## 2020-05-12 NOTE — Progress Notes (Signed)
Physical Therapy Treatment Patient Details Name: [name] MRN: [id number] DOB: 1941-05-01 Today's Date: 05/12/2020    History of Present Illness Pt is 79 year old with history of CVA in 02/2020, Takotsubo cardiomyopathy, DVT/PE, HTN, obstructive sleep apnea admitted for syncopal episode. Found to have submassive PE with saddle emboli and right heart strain. Had elevated troponin and cardiology was consulted who did not suspect ACS. PCCM consulted and patient taken to the ICU where she received TPA.  Complicated by left arm hematoma at the infusion site with some blood loss anemia requiring transfusion.  Due to persistent drop in hemoglobin, anticoagulation was discontinued.  CT abdomen pelvis did not show any retroperitoneal bleed which showed evidence of left upper extremity hematoma and right hip cellulitis.  IVC filter placed on 05/10/2020.    PT Comments    Pt demonstrating gradual progress.  She expressed fear of falling - required encouragement, reassurance, and education on transfer techniques and safety.  Required cues for sequencing during transfers and assist to facilitate posture and weight shift.  Used Stedy for transfer to allow increased work on [employer] shifting and standing in safe/secure manner.  Cont POC.     Follow Up Recommendations  CIR     Equipment Recommendations  Other (comment) (mechanical lift if home)    Recommendations for Other Services Rehab consult     Precautions / Restrictions Precautions Precautions: Fall    Mobility  Bed Mobility Overal bed mobility: Needs Assistance Bed Mobility: Supine to Sit     Supine to sit: Mod assist     General bed mobility comments: Cues for sequence, min A for legs to EOB then mod A to lift trunk and scoot forward  Transfers Overall transfer level: Needs assistance Equipment used: Ambulation equipment used Transfers: Sit to/from Stand Sit to Stand: Mod assist;+2 physical assistance Stand pivot transfers:  Total assist       General transfer comment: Sit to stand x 1 from bed and x 1 from STEDY.  Required cues for feet placement and leaning forward.  Mod A x 2 to stand and support L UE.  Cues for posture with assist at buttock and shoulders to facilitate standing tall.  STEDY for pivot  Ambulation/Gait                 Stairs             Wheelchair Mobility    Modified Rankin (Stroke Patients Only)       Balance Overall balance assessment: Needs assistance Sitting-balance support: Single extremity supported;Feet supported Sitting balance-Leahy Scale: Poor Sitting balance - Comments: Tending to lean to R; required cues and UE assist to correct; min A at times; at EOB for 8 mins Postural control: Right lateral lean Standing balance support: Bilateral upper extremity supported Standing balance-Leahy Scale: Poor Standing balance comment: Required min A static standing and weight shifting in STEDY.  Stood twice for ~30 seconds.  Assisted with weight shifting.                            Cognition Arousal/Alertness: Awake/alert Behavior During Therapy: WFL for tasks assessed/performed Overall Cognitive Status: Impaired/Different from baseline Area of Impairment: Problem solving;Safety/judgement;Awareness                         Safety/Judgement: Decreased awareness of safety Awareness: Emergent Problem Solving: Slow processing;Difficulty sequencing;Decreased initiation General Comments: requiring cues for sequence, reassurance, and  initiation of transfers      Exercises General Exercises - Lower Extremity Ankle Circles/Pumps: AROM;Both;10 reps;Seated Long Arc Quad: AROM;Both;10 reps;Seated Other Exercises Other Exercises: Gentle PROM L elbow, hand, wrist, and shoulder  5x1    General Comments General comments (skin integrity, edema, etc.): VSS on RA with a sats >94%.  Pt expreseed fear of falling and fear of stedy.  Required encouragement and  education on transfer techniques and importance of increasing mobility      Pertinent Vitals/Pain Pain Assessment: Faces Faces Pain Scale: Hurts even more Pain Location: LUE Pain Descriptors / Indicators: Sore Pain Intervention(s): Monitored during session;Repositioned;Relaxation;Other (comment) (elevation)    Home Living                      Prior Function            PT Goals (current goals can now be found in the care plan section) Acute Rehab PT Goals Patient Stated Goal: To get stronger and return to her prior level of function PT Goal Formulation: With patient/family Time For Goal Achievement: 05/25/20 Potential to Achieve Goals: Fair Progress towards PT goals: Progressing toward goals    Frequency    Min 3X/week      PT Plan Current plan remains appropriate    Co-evaluation              AM-PAC PT "6 Clicks" Mobility   Outcome Measure  Help needed turning from your back to your side while in a flat bed without using bedrails?: A Lot Help needed moving from lying on your back to sitting on the side of a flat bed without using bedrails?: A Lot Help needed moving to and from a bed to a chair (including a wheelchair)?: Total Help needed standing up from a chair using your arms (e.g., wheelchair or bedside chair)?: A Lot Help needed to walk in hospital room?: Total Help needed climbing 3-5 steps with a railing? : Total 6 Click Score: 9    End of Session Equipment Utilized During Treatment: Gait belt Activity Tolerance: Patient tolerated treatment well Patient left: in chair;with call bell/phone within reach;with chair alarm set;Other (comment) (maxi-move pad under pt) Nurse Communication: Mobility status;Need for lift equipment (STEDY vs Maxi Move; assist of2) PT Visit Diagnosis: Other abnormalities of gait and mobility (R26.89);Muscle weakness (generalized) (M62.81);Pain Pain - Right/Left: Left Pain - part of body: Arm     Time: 0927-0952 PT  Time Calculation (min) (ACUTE ONLY): 25 min  Charges:  $Therapeutic Exercise: 8-22 mins $Therapeutic Activity: 8-22 mins                     Anise Salvo, PT Acute Rehab Services Pager [phone] [facility] [phone]     Rayetta Humphrey 05/12/2020, 11:14 AM

## 2020-05-12 NOTE — PMR Pre-admission (Signed)
 PMR Admission Coordinator Pre-Admission Assessment   Patient: [name] is an 79 y.o., female MRN: [id number] DOB: 02/08/1941 Height: 5' 2" (157.5 cm) Weight: 91 kg   Insurance Information HMO:     PPO:      PCP:      IPA:      80/20:      OTHER:  PRIMARY: Medicare A and B      Policy#: [id number]            Subscriber: pt  CM Name:       Phone#:      Fax#:  Pre-Cert#: verified online       Employer:  Benefits:  Phone #:      Name:  Eff. Date: A 06/13/91, B 10/11/94     Deduct: $1484      Out of Pocket Max:       Life Max:  CIR: 100%      SNF: 20 full days Outpatient: 80%     Co-Pay: 20% Home Health: 100%      Co-Pay:  DME: 80%     Co-Pay: 20% Providers: n/a SECONDARY: Professional [employer]      Policy#: [id number]           Phone#: [phone]   Financial Counselor:       Phone#:    The "Data Collection Information Summary" for patients in Inpatient Rehabilitation Facilities with attached "Privacy Act Statement-Health Care Records" was provided and verbally reviewed with: Patient and Family   Emergency Contact Information         Contact Information     Name Relation Home Work Mobile    Decker,[name] Son [phone]        Cooper,Carolyn Granddaughter [phone]        Decker,Marilyn Other [phone]             Current Medical History  Patient Admitting Diagnosis: Saddle PE   History of Present Illness: Pt is a 79 y/o female with history of 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  Pt found to have submassive PE with saddle emboli and R heart strain.  Cardiology consulted for elevated troponin and they did not suspect ACS.  PCCM consulted and recommended TPA and transfer to ICU.  TPA complicated by left arm hematoma in the infusion site, requiring ABLA and transfusion.  Due to persistent drop in hemoglobin anticoagulation discontinued.  CT abd/pelvis  negative for retroperitoneal bleed, but did show R hip cellulitis.  Pt had an IVC filter placed on 05/10/2020.  Therapy evaluations completed and pt was recommended for CIR.    Patient's medical record from [facility] has been reviewed by the rehabilitation admission coordinator and physician.   Past Medical History      Past Medical History:  Diagnosis Date  . Arthritis    . Asthma    . Chronic back pain    . Depression    . History of pulmonary embolus (PE)    . Hx-TIA (transient ischemic attack)    . Hyperlipemia    . Hypertension    . Personal history of DVT (deep vein thrombosis)    . Scoliosis    . Sleep apnea      cpap - settingsi at 2   . 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      Family History   family history includes Other in her father.     Prior Rehab/Hospitalizations Has the patient had prior rehab or hospitalizations prior to admission? Yes   Has the patient had major surgery during 100 days prior to admission? Yes              Current Medications   Current Facility-Administered Medications:  .  acetaminophen (TYLENOL) tablet 650 mg, 650 mg, Oral, Q6H PRN, Opyd, Timothy S, MD, 650 mg at 05/07/20 1431 .  artificial tears (LACRILUBE) ophthalmic ointment, , Left Eye, QHS, Amin, Ankit Chirag, MD, Given at 05/12/20 2223 .  busPIRone (BUSPAR) tablet 30 mg, 30 mg, Oral, BID, Gleason, Laura R, PA-C, 30 mg at 05/12/20 2215 .  cephALEXin (KEFLEX) capsule 500 mg, 500 mg, Oral, Q8H, Amin, Ankit Chirag, MD, 500 mg at 05/13/20 0444 .  Chlorhexidine Gluconate Cloth 2 % PADS 6 each, 6 each, Topical, Daily, Hall, Carole N, DO, 6 each at 05/12/20 1030 .  cycloSPORINE (RESTASIS) 0.05 % ophthalmic emulsion 1 drop, 1 drop, Both Eyes, Daily PRN, Amin, Ankit Chirag, MD .  donepezil (ARICEPT) tablet 10 mg, 10 mg, Oral, QHS, Gleason, Laura R, PA-C, 10 mg at 05/12/20 2215 .  insulin aspart (novoLOG) injection 0-9 Units, 0-9 Units, Subcutaneous, Q4H, Gleason, Laura R, PA-C, 1 Units  at 05/11/20 2100 .  linaclotide (LINZESS) capsule 145 mcg, 145 mcg, Oral, Daily, Amin, Ankit Chirag, MD, 145 mcg at 05/13/20 0828 .  memantine (NAMENDA XR) 24 hr capsule 28 mg, 28 mg, Oral, Daily, Gleason, Laura R, PA-C, 28 mg at 05/12/20 1029 .  ondansetron (ZOFRAN) injection 4 mg, 4 mg, Intravenous, Q6H PRN, Opyd, Timothy S, MD, 4 mg at 05/07/20 0039 .  oxyCODONE (Oxy IR/ROXICODONE) immediate release tablet 15 mg, 15 mg, Oral, QID PRN, Amin, Ankit Chirag, MD, 15 mg at 05/13/20 0444 .  polyethylene glycol (MIRALAX / GLYCOLAX) packet 17 g, 17 g, Oral, Daily PRN, Gleason, Laura R, PA-C .  polyvinyl alcohol (LIQUIFILM TEARS) 1.4 % ophthalmic solution 2 drop, 2 drop, Left Eye, QID, Amin, Ankit Chirag, MD, 2 drop at 05/12/20 2222 .  senna-docusate (Senokot-S) tablet 1 tablet, 1 tablet, Oral, QHS, Amin, Ankit Chirag, MD, 1 tablet at 05/12/20 2215 .  vancomycin (VANCOREADY) IVPB 1250 mg/250 mL, 1,250 mg, Intravenous, Q24H, Powell, Lisa K, RPH, Last Rate: 166.7 mL/hr at 05/12/20 1800, Rate Verify at 05/12/20 1800   Patients Current Diet:     Diet Order                      Diet heart healthy/carb modified Room service appropriate? Yes with Assist; Fluid consistency: Thin  Diet effective now                      Precautions / Restrictions Precautions Precautions: Fall Restrictions Weight Bearing Restrictions: No    Has the patient had 2 or more falls or a fall with injury in the past year? No   Prior Activity Level Household: Not going out much since return home from CIR on 8/3. Family provides assist for ADLs and mobility   Prior Functional Level Self Care: Did the patient need help bathing, dressing, using the toilet or eating? Needed some help   Indoor Mobility: Did the patient need assistance with walking from room to room (with or without device)? Needed some help   Stairs: Did the patient need assistance with internal or external stairs (with or without device)? Needed some help    Functional Cognition: Did the patient need help planning regular tasks such   as shopping or remembering to take medications? Needed some help   Home Assistive Devices / Equipment Home Equipment: Walker - 2 wheels, Wheelchair - manual, Bedside commode, Shower seat   Prior Device Use: Indicate devices/aids used by the patient prior to current illness, exacerbation or injury? Walker   Current Functional Level Cognition   Overall Cognitive Status: No family/caregiver present to determine baseline cognitive functioning Orientation Level: Oriented X4 Safety/Judgement: Decreased awareness of safety General Comments: requiring cues for sequence, reassurance, and initiation of transfers    Extremity Assessment (includes Sensation/Coordination)   Upper Extremity Assessment: Generalized weakness, LUE deficits/detail RUE Deficits / Details: grossly 4-/5 LUE Deficits / Details: edematous, 3-/5 shoulder flexion, elbow flexion limited by swelling, grip 3/5 LUE: Unable to fully assess due to pain LUE Sensation: WNL LUE Coordination: decreased gross motor  Lower Extremity Assessment: Defer to PT evaluation     ADLs   Overall ADL's : Needs assistance/impaired Eating/Feeding: Set up, Sitting Grooming: Wash/dry hands, Wash/dry face, Minimal assistance, Bed level Upper Body Bathing: Maximal assistance, Bed level Lower Body Bathing: Total assistance, Bed level Upper Body Dressing : Maximal assistance, Bed level Lower Body Dressing: Total assistance, Bed level Toileting- Clothing Manipulation and Hygiene: Total assistance, Bed level Functional mobility during ADLs: Total assistance General ADL Comments: maximove used for back to bed.     Mobility   Overal bed mobility: Needs Assistance Bed Mobility: Supine to Sit Supine to sit: Mod assist General bed mobility comments: +2 to reposition in bed after placement via lift.     Transfers   Overall transfer level: Needs assistance Equipment used:  Ambulation equipment used Transfer via Lift Equipment: Sara Lift Transfers: Sit to/from Stand Sit to Stand: Mod assist, +2 physical assistance Stand pivot transfers: Total assist General transfer comment: Sit to stand x 1 from bed and x 1 from STEDY.  Required cues for feet placement and leaning forward.  Mod A x 2 to stand and support L UE.  Cues for posture with assist at buttock and shoulders to facilitate standing tall.  STEDY for pivot     Ambulation / Gait / Stairs / Wheelchair Mobility         Posture / Balance Dynamic Sitting Balance Sitting balance - Comments: Tending to lean to R; required cues and UE assist to correct; min A at times; at EOB for 8 mins Balance Overall balance assessment: Needs assistance Sitting-balance support: Single extremity supported, Feet supported Sitting balance-Leahy Scale: Poor Sitting balance - Comments: Tending to lean to R; required cues and UE assist to correct; min A at times; at EOB for 8 mins Postural control: Right lateral lean Standing balance support: Bilateral upper extremity supported Standing balance-Leahy Scale: Poor Standing balance comment: Required min A static standing and weight shifting in STEDY.  Stood twice for ~30 seconds.  Assisted with weight shifting.     Special needs/care consideration Diabetic management yes and Designated visitor [name]    Previous Home Environment (from acute therapy documentation) Living Arrangements: Other relatives Available Help at Discharge: Family, Available 24 hours/day Type of Home: House Home Layout: One level Home Access: Level entry Bathroom Shower/Tub: Door, Walk-in shower Bathroom Toilet: Standard Bathroom Accessibility: Yes How Accessible: Accessible via walker Additional Comments: granddaughter lives with patient   Discharge Living Setting Plans for Discharge Living Setting: Lives with (comment) (son/daughter in law/grand daughter) Type of Home at Discharge: House Discharge  Home Layout: One level Discharge Home Access: Level entry Discharge Bathroom Shower/Tub: Walk-in shower Discharge Bathroom   Program Orientation Provided & Reviewed with Pt/Caregiver Including Roles  & Responsibilities: Yes  Decrease burden of Care through IP rehab admission: n/a  Possible need for SNF placement upon discharge: Not anticipated  Patient Condition: I have reviewed medical records from [facility], spoken with CM, and patient and son. I met with patient at the bedside for inpatient rehabilitation assessment.  Patient will benefit from ongoing PT and OT, can actively participate in 3 hours of therapy a day 5 days of the week, and can make measurable gains during the admission.  Patient will also benefit from the coordinated team approach during an Inpatient Acute Rehabilitation admission.  The patient will receive intensive therapy as well as Rehabilitation physician, nursing, social worker, and care management interventions.  Due to safety, skin/wound care, disease management, medication administration, pain management and patient education the patient requires 24 hour a day rehabilitation nursing.  The patient is currently Mod +2-Max A with mobility and basic ADLs.  Discharge setting and therapy post discharge at home with home  health is anticipated.  Patient has agreed to participate in the Acute Inpatient Rehabilitation Program and will admit today.  Preadmission Screen Completed By: Shann Medal, PT, DPT, with updates by  Clemens Catholic, MS CCC-SLP, 05/13/2020 8:54 AM ______________________________________________________________________   Discussed status with Dr. Ranell Patrick  on 05/13/20  at 8:56 AM  and received approval for admission today.  Admission Coordinator: Shann Medal, PT, DPT;  Genella Mech, CCC-SLP, time 8:56 AM Sudie Grumbling 05/13/20  With updates 05/13/20 by Clemens Catholic, MS, CCC-SLP   Assessment/Plan: Diagnosis: Debility 1. Does the need for close, 24 hr/day Medical supervision in concert with the patient's rehab needs make it unreasonable for this patient to be served in a less intensive setting? Yes 2. Co-Morbidities requiring supervision/potential complications: Takotsubo cardiomyopathy, submassive PE with saddle emboli and right heart strain, HTN, OSA, syncope 3. Due to bladder management, bowel management, safety, skin/wound care, disease management, medication administration, pain management and patient education, does the patient require 24 hr/day rehab nursing? Yes 4. Does the patient require coordinated care of a physician, rehab nurse, PT, OT, and SLP to address physical and functional deficits in the context of the above medical diagnosis(es)? Yes Addressing deficits in the following areas: balance, endurance, locomotion, strength, transferring, bowel/bladder control, bathing, dressing, feeding, grooming, toileting, cognition and psychosocial support 5. Can the patient actively participate in an intensive therapy program of at least 3 hrs of therapy 5 days a week? Yes 6. The potential for patient to make measurable gains while on inpatient rehab is excellent 7. Anticipated functional outcomes upon discharge from inpatient rehab: min assist PT, min assist OT, modified independent SLP 8. Estimated  rehab length of stay to reach the above functional goals is: 14-18 days 9. Anticipated discharge destination: Home 10. Overall Rehab/Functional Prognosis: excellent   MD Signature: Leeroy Cha, MD

## 2020-05-13 ENCOUNTER — Other Ambulatory Visit: Payer: Self-pay

## 2020-05-13 ENCOUNTER — Inpatient Hospital Stay (HOSPITAL_COMMUNITY)
Admission: RE | Admit: 2020-05-13 | Discharge: 2020-05-24 | DRG: 945 | Disposition: A | Discharge status: Other Acute Inpatient Hospital | Payer: Medicare Other | Source: Intra-hospital | Attending: Physical Medicine & Rehabilitation | Admitting: Physical Medicine & Rehabilitation

## 2020-05-13 DIAGNOSIS — I2699 Other pulmonary embolism without acute cor pulmonale: Secondary | ICD-10-CM | POA: Diagnosis not present

## 2020-05-13 DIAGNOSIS — F0390 Unspecified dementia without behavioral disturbance: Secondary | ICD-10-CM | POA: Diagnosis present

## 2020-05-13 DIAGNOSIS — G4700 Insomnia, unspecified: Secondary | ICD-10-CM | POA: Diagnosis present

## 2020-05-13 DIAGNOSIS — T148XXA Other injury of unspecified body region, initial encounter: Secondary | ICD-10-CM | POA: Diagnosis not present

## 2020-05-13 DIAGNOSIS — Z8673 Personal history of transient ischemic attack (TIA), and cerebral infarction without residual deficits: Secondary | ICD-10-CM | POA: Diagnosis not present

## 2020-05-13 DIAGNOSIS — L03115 Cellulitis of right lower limb: Secondary | ICD-10-CM | POA: Diagnosis present

## 2020-05-13 DIAGNOSIS — R630 Anorexia: Secondary | ICD-10-CM | POA: Diagnosis not present

## 2020-05-13 DIAGNOSIS — R9431 Abnormal electrocardiogram [ECG] [EKG]: Secondary | ICD-10-CM

## 2020-05-13 DIAGNOSIS — E875 Hyperkalemia: Secondary | ICD-10-CM | POA: Diagnosis not present

## 2020-05-13 DIAGNOSIS — Z79899 Other long term (current) drug therapy: Secondary | ICD-10-CM | POA: Diagnosis not present

## 2020-05-13 DIAGNOSIS — D62 Acute posthemorrhagic anemia: Secondary | ICD-10-CM | POA: Diagnosis present

## 2020-05-13 DIAGNOSIS — M545 Low back pain, unspecified: Secondary | ICD-10-CM | POA: Diagnosis present

## 2020-05-13 DIAGNOSIS — Z87891 Personal history of nicotine dependence: Secondary | ICD-10-CM

## 2020-05-13 DIAGNOSIS — R7401 Elevation of levels of liver transaminase levels: Secondary | ICD-10-CM | POA: Diagnosis not present

## 2020-05-13 DIAGNOSIS — I10 Essential (primary) hypertension: Secondary | ICD-10-CM | POA: Diagnosis present

## 2020-05-13 DIAGNOSIS — Z888 Allergy status to other drugs, medicaments and biological substances status: Secondary | ICD-10-CM | POA: Diagnosis not present

## 2020-05-13 DIAGNOSIS — M1990 Unspecified osteoarthritis, unspecified site: Secondary | ICD-10-CM | POA: Diagnosis present

## 2020-05-13 DIAGNOSIS — E785 Hyperlipidemia, unspecified: Secondary | ICD-10-CM | POA: Diagnosis present

## 2020-05-13 DIAGNOSIS — R404 Transient alteration of awareness: Secondary | ICD-10-CM | POA: Diagnosis not present

## 2020-05-13 DIAGNOSIS — M7981 Nontraumatic hematoma of soft tissue: Secondary | ICD-10-CM | POA: Diagnosis present

## 2020-05-13 DIAGNOSIS — Z885 Allergy status to narcotic agent status: Secondary | ICD-10-CM

## 2020-05-13 DIAGNOSIS — G8929 Other chronic pain: Secondary | ICD-10-CM | POA: Diagnosis present

## 2020-05-13 DIAGNOSIS — E782 Mixed hyperlipidemia: Secondary | ICD-10-CM | POA: Diagnosis not present

## 2020-05-13 DIAGNOSIS — R600 Localized edema: Secondary | ICD-10-CM | POA: Diagnosis not present

## 2020-05-13 DIAGNOSIS — R5381 Other malaise: Principal | ICD-10-CM | POA: Diagnosis present

## 2020-05-13 DIAGNOSIS — K5900 Constipation, unspecified: Secondary | ICD-10-CM | POA: Diagnosis present

## 2020-05-13 DIAGNOSIS — N178 Other acute kidney failure: Secondary | ICD-10-CM | POA: Diagnosis not present

## 2020-05-13 DIAGNOSIS — G4733 Obstructive sleep apnea (adult) (pediatric): Secondary | ICD-10-CM | POA: Diagnosis present

## 2020-05-13 DIAGNOSIS — F419 Anxiety disorder, unspecified: Secondary | ICD-10-CM | POA: Diagnosis present

## 2020-05-13 DIAGNOSIS — M7989 Other specified soft tissue disorders: Secondary | ICD-10-CM | POA: Diagnosis not present

## 2020-05-13 DIAGNOSIS — H04123 Dry eye syndrome of bilateral lacrimal glands: Secondary | ICD-10-CM | POA: Diagnosis present

## 2020-05-13 DIAGNOSIS — M419 Scoliosis, unspecified: Secondary | ICD-10-CM | POA: Diagnosis present

## 2020-05-13 DIAGNOSIS — N179 Acute kidney failure, unspecified: Secondary | ICD-10-CM | POA: Diagnosis not present

## 2020-05-13 DIAGNOSIS — Z86711 Personal history of pulmonary embolism: Secondary | ICD-10-CM

## 2020-05-13 DIAGNOSIS — G9340 Encephalopathy, unspecified: Secondary | ICD-10-CM | POA: Diagnosis not present

## 2020-05-13 DIAGNOSIS — Z7982 Long term (current) use of aspirin: Secondary | ICD-10-CM

## 2020-05-13 DIAGNOSIS — I76 Septic arterial embolism: Secondary | ICD-10-CM | POA: Diagnosis not present

## 2020-05-13 DIAGNOSIS — F0150 Vascular dementia without behavioral disturbance: Secondary | ICD-10-CM | POA: Diagnosis not present

## 2020-05-13 DIAGNOSIS — F32A Depression, unspecified: Secondary | ICD-10-CM | POA: Diagnosis present

## 2020-05-13 DIAGNOSIS — Z515 Encounter for palliative care: Secondary | ICD-10-CM | POA: Diagnosis not present

## 2020-05-13 DIAGNOSIS — Z9989 Dependence on other enabling machines and devices: Secondary | ICD-10-CM | POA: Diagnosis not present

## 2020-05-13 DIAGNOSIS — E8809 Other disorders of plasma-protein metabolism, not elsewhere classified: Secondary | ICD-10-CM | POA: Diagnosis present

## 2020-05-13 DIAGNOSIS — M79609 Pain in unspecified limb: Secondary | ICD-10-CM | POA: Diagnosis not present

## 2020-05-13 DIAGNOSIS — D72829 Elevated white blood cell count, unspecified: Secondary | ICD-10-CM | POA: Diagnosis not present

## 2020-05-13 DIAGNOSIS — I5181 Takotsubo syndrome: Secondary | ICD-10-CM | POA: Diagnosis not present

## 2020-05-13 DIAGNOSIS — M549 Dorsalgia, unspecified: Secondary | ICD-10-CM | POA: Diagnosis present

## 2020-05-13 DIAGNOSIS — Z96652 Presence of left artificial knee joint: Secondary | ICD-10-CM | POA: Diagnosis present

## 2020-05-13 DIAGNOSIS — G479 Sleep disorder, unspecified: Secondary | ICD-10-CM | POA: Diagnosis not present

## 2020-05-13 DIAGNOSIS — I69398 Other sequelae of cerebral infarction: Secondary | ICD-10-CM | POA: Diagnosis not present

## 2020-05-13 DIAGNOSIS — E43 Unspecified severe protein-calorie malnutrition: Secondary | ICD-10-CM | POA: Diagnosis not present

## 2020-05-13 DIAGNOSIS — R29818 Other symptoms and signs involving the nervous system: Secondary | ICD-10-CM | POA: Diagnosis not present

## 2020-05-13 DIAGNOSIS — Z7189 Other specified counseling: Secondary | ICD-10-CM | POA: Diagnosis not present

## 2020-05-13 LAB — CBC
HCT: 32 % — ABNORMAL LOW (ref 36.0–46.0)
Hemoglobin: 10 g/dL — ABNORMAL LOW (ref 12.0–15.0)
MCH: 30.9 pg (ref 26.0–34.0)
MCHC: 31.3 g/dL (ref 30.0–36.0)
MCV: 98.8 fL (ref 80.0–100.0)
Platelets: 176 10*3/uL (ref 150–400)
RBC: 3.24 MIL/uL — ABNORMAL LOW (ref 3.87–5.11)
RDW: 16.2 % — ABNORMAL HIGH (ref 11.5–15.5)
WBC: 11 10*3/uL — ABNORMAL HIGH (ref 4.0–10.5)
nRBC: 1 % — ABNORMAL HIGH (ref 0.0–0.2)

## 2020-05-13 LAB — BASIC METABOLIC PANEL
Anion gap: 10 (ref 5–15)
BUN: 12 mg/dL (ref 8–23)
CO2: 24 mmol/L (ref 22–32)
Calcium: 8.6 mg/dL — ABNORMAL LOW (ref 8.9–10.3)
Chloride: 102 mmol/L (ref 98–111)
Creatinine, Ser: 0.77 mg/dL (ref 0.44–1.00)
GFR calc Af Amer: >60 mL/min (ref >60)
GFR calc non Af Amer: >60 mL/min (ref >60)
Glucose, Bld: 107 mg/dL — ABNORMAL HIGH (ref 70–99)
Potassium: 3.7 mmol/L (ref 3.5–5.1)
Sodium: 136 mmol/L (ref 135–145)

## 2020-05-13 LAB — GLUCOSE, CAPILLARY
Glucose-Capillary: 110 mg/dL — ABNORMAL HIGH (ref 70–99)
Glucose-Capillary: 114 mg/dL — ABNORMAL HIGH (ref 70–99)
Glucose-Capillary: 116 mg/dL — ABNORMAL HIGH (ref 70–99)
Glucose-Capillary: 88 mg/dL (ref 70–99)
Glucose-Capillary: 91 mg/dL (ref 70–99)
Glucose-Capillary: 96 mg/dL (ref 70–99)

## 2020-05-13 LAB — MAGNESIUM: Magnesium: 2 mg/dL (ref 1.7–2.4)

## 2020-05-13 MED ORDER — MEMANTINE HCL ER 28 MG PO CP24
28.0000 mg | ORAL_CAPSULE | Freq: Every day | ORAL | Status: DC
  Administered 2020-05-14 – 2020-05-24 (×11): 28 mg via ORAL
  Filled 2020-05-13 (×11): qty 1

## 2020-05-13 MED ORDER — CEPHALEXIN 250 MG PO CAPS
500.0000 mg | ORAL_CAPSULE | Freq: Three times a day (TID) | ORAL | Status: AC
  Administered 2020-05-13 – 2020-05-15 (×5): 500 mg via ORAL
  Filled 2020-05-13 (×5): qty 2

## 2020-05-13 MED ORDER — AMLODIPINE BESYLATE 10 MG PO TABS
10.0000 mg | ORAL_TABLET | Freq: Every day | ORAL | Status: DC
  Administered 2020-05-13 – 2020-05-24 (×12): 10 mg via ORAL
  Filled 2020-05-13 (×12): qty 1

## 2020-05-13 MED ORDER — VANCOMYCIN HCL 1250 MG/250ML IV SOLN
1250.0000 mg | INTRAVENOUS | Status: AC
  Administered 2020-05-13 – 2020-05-14 (×2): 1250 mg via INTRAVENOUS
  Filled 2020-05-13 (×3): qty 250

## 2020-05-13 MED ORDER — LIDOCAINE 5 % EX PTCH
1.0000 | MEDICATED_PATCH | CUTANEOUS | Status: DC
  Administered 2020-05-13 – 2020-05-23 (×11): 1 via TRANSDERMAL
  Filled 2020-05-13 (×11): qty 1

## 2020-05-13 MED ORDER — CYCLOSPORINE 0.05 % OP EMUL
1.0000 [drp] | OPHTHALMIC | Status: DC | PRN
  Administered 2020-05-15 – 2020-05-16 (×2): 1 [drp] via OPHTHALMIC
  Filled 2020-05-13 (×3): qty 1

## 2020-05-13 MED ORDER — CEPHALEXIN 500 MG PO CAPS: 500.0000 mg | ORAL_CAPSULE | Freq: Three times a day (TID) | ORAL | 0 refills | Status: AC

## 2020-05-13 MED ORDER — MELATONIN 3 MG PO TABS
3.0000 mg | ORAL_TABLET | Freq: Every day | ORAL | Status: DC
  Administered 2020-05-13 – 2020-05-23 (×11): 3 mg via ORAL
  Filled 2020-05-13 (×12): qty 1

## 2020-05-13 MED ORDER — DONEPEZIL HCL 10 MG PO TABS
10.0000 mg | ORAL_TABLET | Freq: Every day | ORAL | Status: DC
  Administered 2020-05-13 – 2020-05-23 (×11): 10 mg via ORAL
  Filled 2020-05-13 (×11): qty 1

## 2020-05-13 MED ORDER — VITAMIN D 25 MCG (1000 UNIT) PO TABS
1000.0000 [IU] | ORAL_TABLET | Freq: Every day | ORAL | Status: DC
  Administered 2020-05-13 – 2020-05-24 (×12): 1000 [IU] via ORAL
  Filled 2020-05-13 (×12): qty 1

## 2020-05-13 MED ORDER — POLYVINYL ALCOHOL 1.4 % OP SOLN
4.0000 [drp] | Freq: Every day | OPHTHALMIC | Status: DC
  Administered 2020-05-14 – 2020-05-24 (×11): 4 [drp] via OPHTHALMIC
  Filled 2020-05-13: qty 15

## 2020-05-13 MED ORDER — SENNOSIDES-DOCUSATE SODIUM 8.6-50 MG PO TABS: 1.0000 | ORAL_TABLET | Freq: Every day | ORAL | Status: DC

## 2020-05-13 MED ORDER — TAB-A-VITE/IRON PO TABS
1.0000 | ORAL_TABLET | Freq: Every day | ORAL | Status: DC
  Administered 2020-05-13 – 2020-05-24 (×12): 1 via ORAL
  Filled 2020-05-13 (×12): qty 1

## 2020-05-13 MED ORDER — ACETAMINOPHEN 325 MG PO TABS
325.0000 mg | ORAL_TABLET | Freq: Four times a day (QID) | ORAL | Status: DC | PRN
  Administered 2020-05-14: 325 mg via ORAL
  Filled 2020-05-13: qty 1

## 2020-05-13 MED ORDER — POTASSIUM CHLORIDE CRYS ER 20 MEQ PO TBCR
40.0000 meq | EXTENDED_RELEASE_TABLET | Freq: Once | ORAL | Status: AC
  Administered 2020-05-13: 40 meq via ORAL
  Filled 2020-05-13: qty 2

## 2020-05-13 MED ORDER — LISINOPRIL 20 MG PO TABS
40.0000 mg | ORAL_TABLET | Freq: Every day | ORAL | Status: DC
  Administered 2020-05-13 – 2020-05-22 (×10): 40 mg via ORAL
  Filled 2020-05-13 (×10): qty 2

## 2020-05-13 MED ORDER — LINACLOTIDE 145 MCG PO CAPS
145.0000 ug | ORAL_CAPSULE | Freq: Every day | ORAL | Status: DC
  Administered 2020-05-14 – 2020-05-24 (×11): 145 ug via ORAL
  Filled 2020-05-13 (×11): qty 1

## 2020-05-13 MED ORDER — OXYCODONE HCL 5 MG PO TABS: 15.0000 mg | ORAL_TABLET | Freq: Four times a day (QID) | ORAL | Status: DC | PRN

## 2020-05-13 MED ORDER — SENNOSIDES-DOCUSATE SODIUM 8.6-50 MG PO TABS
1.0000 | ORAL_TABLET | Freq: Every day | ORAL | Status: DC
  Administered 2020-05-13 – 2020-05-21 (×9): 1 via ORAL
  Filled 2020-05-13 (×9): qty 1

## 2020-05-13 MED ORDER — ATORVASTATIN CALCIUM 10 MG PO TABS
20.0000 mg | ORAL_TABLET | Freq: Every day | ORAL | Status: DC
  Administered 2020-05-13 – 2020-05-23 (×11): 20 mg via ORAL
  Filled 2020-05-13 (×11): qty 2

## 2020-05-13 MED ORDER — ARTIFICIAL TEARS OPHTHALMIC OINT
TOPICAL_OINTMENT | Freq: Every day | OPHTHALMIC | Status: DC
  Filled 2020-05-13: qty 3.5

## 2020-05-13 MED ORDER — BUSPIRONE HCL 5 MG PO TABS
30.0000 mg | ORAL_TABLET | Freq: Two times a day (BID) | ORAL | Status: DC
  Administered 2020-05-13 – 2020-05-22 (×18): 30 mg via ORAL
  Filled 2020-05-13 (×19): qty 6

## 2020-05-13 MED ORDER — POLYETHYLENE GLYCOL 3350 17 G PO PACK
17.0000 g | PACK | Freq: Two times a day (BID) | ORAL | Status: DC
  Administered 2020-05-13 – 2020-05-24 (×21): 17 g via ORAL
  Filled 2020-05-13 (×22): qty 1

## 2020-05-13 MED ORDER — VANCOMYCIN HCL 1250 MG/250ML IV SOLN: 1250.0000 mg | INTRAVENOUS | 0 refills | Status: DC

## 2020-05-13 NOTE — H&P (Signed)
Physical Medicine and Rehabilitation H&P   HPI: [name] is a 79 y.o. female with a PMH of DVT/PE, Takatsubo cardiomyopathy, HTN, OSA, who was admitted syncopal episode and was found to have a submassive PE with saddle emboli and right heart strain. She received tPA and developed left arm hematoma at the infusion site. She required transfusion for acute blood loss anemia. Anticoagulation was discontinued due to drop in hemoglobin. IVC filter was placed on 05/10/20.    Review of Systems  Constitutional: Negative.   HENT: Negative.   Eyes: Negative.   Respiratory: Negative.   Cardiovascular: Negative.   Gastrointestinal: Negative.   Genitourinary: Negative.   Musculoskeletal: Positive for back pain.  Skin: Negative.   Neurological: Positive for weakness.  Endo/Heme/Allergies: Negative.   Psychiatric/Behavioral: The patient has insomnia.    Past Medical History:  Diagnosis Date  . Arthritis   . Asthma   . Chronic back pain   . Depression   . History of pulmonary embolus (PE)   . Hx-TIA (transient ischemic attack)   . Hyperlipemia   . Hypertension   . Personal history of DVT (deep vein thrombosis)   . Scoliosis   . Sleep apnea    cpap - settingsi at 2   . 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 ARTHROSCOPIC REPAIR ACL  02/03/2004  . IR IVC FILTER PLMT / S&I /IMG GUID/MOD SED  05/10/2020  . LEFT HEART CATH AND CORONARY ANGIOGRAPHY N/A 09/16/2019   Procedure: LEFT HEART CATH AND CORONARY ANGIOGRAPHY;  Surgeon: Yvonne Kendall, MD;  Location: [facility] INVASIVE CV [facility];  Service: Cardiovascular;  Laterality: N/A;  . LOOP RECORDER INSERTION N/A 02/21/2020   Procedure: LOOP RECORDER INSERTION;  Surgeon: Duke Salvia, MD;  Location: [facility] INVASIVE CV LAB;  Service: Cardiovascular;  Laterality: N/A;  . OOPHORECTOMY  1970  . SPINE SURGERY  03/31/2006  . TOTAL KNEE ARTHROPLASTY Left 05/17/2016   Procedure: LEFT TOTAL KNEE  ARTHROPLASTY;  Surgeon: Kathryne Hitch, MD;  Location: [facility];  Service: Orthopedics;  Laterality: Left;  Adductor Block; Spinal to General  . TUBAL LIGATION  1983   Family History  Problem Relation Age of Onset  . Other Father        kidney problems   Social History:  reports that she quit smoking about 46 years ago. Her smoking use included cigarettes. She has a 10.00 pack-year smoking history. She has never used smokeless tobacco. She reports that she does not drink alcohol and does not use drugs. Allergies:  Allergies  Allergen Reactions  . T-Pa [Alteplase] Swelling    Does okay with it if premedicated with benadryl.  . Codeine Itching   Medications Prior to Admission  Medication Sig Dispense Refill  . acetaminophen (TYLENOL) 325 MG tablet Take 2 tablets (650 mg total) by mouth every 4 (four) hours as needed for mild pain (or temp > 37.5 C (99.5 F)).    Marland Kitchen amLODipine (NORVASC) 10 MG tablet Take 10 mg by mouth daily.    Marland Kitchen artificial tears (LACRILUBE) OINT ophthalmic ointment Place into the left eye at bedtime. Apply left eye bedtime 1 Tube 1  . atorvastatin (LIPITOR) 20 MG tablet Take 1 tablet (20 mg total) by mouth at bedtime. 30 tablet 0  . busPIRone (BUSPAR) 30 MG tablet Take 1 tablet (30 mg total) by mouth 2 (two) times daily. 180 tablet 0  . cholecalciferol (VITAMIN D3) 25 MCG (1000 UNIT)  tablet Take 1 tablet (1,000 Units total) by mouth daily. 30 tablet 0  . clopidogrel (PLAVIX) 75 MG tablet Take 1 tablet (75 mg total) by mouth daily. 30 tablet 0  . cycloSPORINE (RESTASIS) 0.05 % ophthalmic emulsion Place 1 drop into both eyes daily as needed (dry eyes). 0.4 mL   . diphenhydramine-acetaminophen (TYLENOL PM) 25-500 MG TABS tablet Take 1 tablet by mouth at bedtime as needed.    . donepezil (ARICEPT) 10 MG tablet Take 1 tablet (10 mg total) by mouth at bedtime. 30 tablet 0  . LINZESS 145 MCG CAPS capsule Take 145 mcg by mouth daily.    Marland Kitchen lisinopril (ZESTRIL) 40 MG tablet  Take 40 mg by mouth daily.    . melatonin 3 MG TABS tablet Take 1 tablet (3 mg total) by mouth at bedtime. 30 tablet 0  . memantine (NAMENDA XR) 28 MG CP24 24 hr capsule Take 1 capsule (28 mg total) by mouth daily. 30 capsule 0  . Multiple Vitamins-Minerals (MULTI-VITAMIN GUMMIES PO) Take 1 each by mouth daily.     . nitroGLYCERIN (NITROSTAT) 0.4 MG SL tablet Place 1 tablet (0.4 mg total) under the tongue every 5 (five) minutes as needed for chest pain. 25 tablet 2  . oxyCODONE (ROXICODONE) 15 MG immediate release tablet Take 1 tablet (15 mg total) by mouth 4 (four) times daily as needed (chronic pain). 30 tablet 0  . polyethylene glycol (MIRALAX / GLYCOLAX) 17 g packet Take 17 g by mouth 2 (two) times daily. 14 each 0  . polyvinyl alcohol (LIQUIFILM TEARS) 1.4 % ophthalmic solution Place 2 drops into the left eye 4 (four) times daily. 15 mL 0  . aspirin EC 81 MG EC tablet Take 1 tablet (81 mg total) by mouth daily. 90 tablet 3  . mirtazapine (REMERON) 15 MG tablet Take 1 tablet (15 mg total) by mouth as needed (at bedtime). (Patient not taking: Reported on 05/06/2020) 90 tablet 0  . oxyCODONE (ROXICODONE) 15 MG immediate release tablet Take 1 tablet (15 mg total) by mouth 4 (four) times daily as needed (chronic pain). (Patient not taking: Reported on 05/06/2020) 30 tablet 0    Home: Home Living Family/patient expects to be discharged to:: Private residence Living Arrangements: Other relatives Available Help at Discharge: Family, Available 24 hours/day Type of Home: House Home Access: Level entry Home Layout: One level Bathroom Shower/Tub: Door, Health visitor: Standard Bathroom Accessibility: Yes Home Equipment: Environmental consultant - 2 wheels, Wheelchair - manual, Bedside commode, Shower seat Additional Comments: granddaughter lives with patient  Functional History: Prior Function Level of Independence: Needs assistance Gait / Transfers Assistance Needed: pt ambulates with  minG/supervision with RW since D/C from rehab ADL's / Homemaking Assistance Needed: Family assists with transfer to shower, assist with LB, back and backside.  patient was able to assist with UB and grooming seated. Comments: granddaughter lives with pt and helps her with showers; per nursing, son assists with med prep, community mobility and IADLs. Could perform light home management and meal prep seated with setup. Functional Status:  Mobility: Bed Mobility Overal bed mobility: Needs Assistance Bed Mobility: Supine to Sit Supine to sit: Mod assist General bed mobility comments: +2 to reposition in bed after placement via lift. Transfers Overall transfer level: Needs assistance Equipment used: Ambulation equipment used Transfer via Lift Equipment: Hydrographic surveyor Transfers: Sit to/from Stand Sit to Stand: Mod assist, +2 physical assistance Stand pivot transfers: Total assist General transfer comment: Sit to stand x 1 from  bed and x 1 from STEDY.  Required cues for feet placement and leaning forward.  Mod A x 2 to stand and support L UE.  Cues for posture with assist at buttock and shoulders to facilitate standing tall.  STEDY for pivot      ADL: ADL Overall ADL's : Needs assistance/impaired Eating/Feeding: Set up, Sitting Grooming: Wash/dry hands, Wash/dry face, Minimal assistance, Bed level Upper Body Bathing: Maximal assistance, Bed level Lower Body Bathing: Total assistance, Bed level Upper Body Dressing : Maximal assistance, Bed level Lower Body Dressing: Total assistance, Bed level Toileting- Clothing Manipulation and Hygiene: Total assistance, Bed level Functional mobility during ADLs: Total assistance General ADL Comments: maximove used for back to bed.  Cognition: Cognition Overall Cognitive Status: No family/caregiver present to determine baseline cognitive functioning Orientation Level: Oriented X4 Cognition Arousal/Alertness: Awake/alert Behavior During Therapy: WFL for  tasks assessed/performed Overall Cognitive Status: No family/caregiver present to determine baseline cognitive functioning Area of Impairment: Problem solving, Safety/judgement, Awareness Safety/Judgement: Decreased awareness of safety Awareness: Emergent Problem Solving: Slow processing, Difficulty sequencing, Decreased initiation General Comments: requiring cues for sequence, reassurance, and initiation of transfers  Blood pressure 136/69, pulse 66, temperature 98.5 F (36.9 C), temperature source Oral, resp. rate 18, height 5\' 2"  (1.575 m), weight 91 kg, SpO2 96 %. Physical Exam  General: Alert and oriented x 2 (not date), No apparent distress HEENT: Head is normocephalic, atraumatic, PERRLA, EOMI, sclera anicteric, oral mucosa pink and moist, dentition intact, ext ear canals clear,  Neck: Supple without JVD or lymphadenopathy Heart: Reg rate and rhythm. No murmurs rubs or gallops Chest: CTA bilaterally without wheezes, rales, or rhonchi; no distress Abdomen: Soft, non-tender, non-distended, bowel sounds positive. Extremities: No clubbing, left arm hematoma- swollen, warm Skin: Clean and intact without signs of breakdown Neuro: Pt is cognitively appropriate with normal insight, memory, and awareness.  Fine motor coordination is intact on right side. Left arm unable to move- swollen due to hematoma. No tremors. Motor function is grossly 4/5 in other extremities.  Musculoskeletal: Right 3rd digit PIP deformity Psych: Pt's affect is appropriate. Pt is cooperative   Results for orders placed or performed during the hospital encounter of 05/05/20 (from the past 24 hour(s))  Glucose, capillary     Status: Abnormal   Collection Time: 05/12/20 11:47 AM  Result Value Ref Range   Glucose-Capillary 106 (H) 70 - 99 mg/dL   Comment 1 Notify RN    Comment 2 Document in Chart   Glucose, capillary     Status: None   Collection Time: 05/12/20  4:23 PM  Result Value Ref Range   Glucose-Capillary  99 70 - 99 mg/dL   Comment 1 Notify RN    Comment 2 Document in Chart   Glucose, capillary     Status: Abnormal   Collection Time: 05/12/20  8:32 PM  Result Value Ref Range   Glucose-Capillary 109 (H) 70 - 99 mg/dL  Glucose, capillary     Status: Abnormal   Collection Time: 05/13/20 12:20 AM  Result Value Ref Range   Glucose-Capillary 110 (H) 70 - 99 mg/dL  Basic metabolic panel     Status: Abnormal   Collection Time: 05/13/20  2:11 AM  Result Value Ref Range   Sodium 136 135 - 145 mmol/L   Potassium 3.7 3.5 - 5.1 mmol/L   Chloride 102 98 - 111 mmol/L   CO2 24 22 - 32 mmol/L   Glucose, Bld 107 (H) 70 - 99 mg/dL   BUN  12 8 - 23 mg/dL   Creatinine, Ser 8.78 0.44 - 1.00 mg/dL   Calcium 8.6 (L) 8.9 - 10.3 mg/dL   GFR calc non Af Amer >60 >60 mL/min   GFR calc Af Amer >60 >60 mL/min   Anion gap 10 5 - 15  CBC     Status: Abnormal   Collection Time: 05/13/20  2:11 AM  Result Value Ref Range   WBC 11.0 (H) 4.0 - 10.5 K/uL   RBC 3.24 (L) 3.87 - 5.11 MIL/uL   Hemoglobin 10.0 (L) 12.0 - 15.0 g/dL   HCT 67.6 (L) 36 - 46 %   MCV 98.8 80.0 - 100.0 fL   MCH 30.9 26.0 - 34.0 pg   MCHC 31.3 30.0 - 36.0 g/dL   RDW 72.0 (H) 94.7 - 09.6 %   Platelets 176 150 - 400 K/uL   nRBC 1.0 (H) 0.0 - 0.2 %  Magnesium     Status: None   Collection Time: 05/13/20  2:11 AM  Result Value Ref Range   Magnesium 2.0 1.7 - 2.4 mg/dL  Glucose, capillary     Status: None   Collection Time: 05/13/20  4:28 AM  Result Value Ref Range   Glucose-Capillary 91 70 - 99 mg/dL  Glucose, capillary     Status: None   Collection Time: 05/13/20  7:45 AM  Result Value Ref Range   Glucose-Capillary 88 70 - 99 mg/dL   No results found.  Medical Assessment/Plan: 1.Debility following submassive PE with septic emboli and right heart strain.      -patient may shower -ELOS/Goals: 14-18 days, mod I to min assist             -Admit to CIR 2. Antithrombotics: -DVT/anticoagulation:IVC  filter -antiplatelet therapy: N/A 3. Pain Management:oxycodone PRN for low back pain. Add heating pad and lidocaine patch 4. Mood:Provide emotional support. Denies depression but would like to continue on Buspar.  -antipsychotic agents: N/A 5. Neuropsych: This patientiscapable of making decisions on herown behalf. 6. Skin/Wound Care:warm compress to right hematoma 7. Fluids/Electrolytes/Nutrition:Routine in and outs with follow-up chemistries 8. Dementia: Continue Aricept and Memantine 9. Right hip cellulitis: continue Keflex and vancomycin.  10. Constipation: Has BM every three days. Continue Linzess, Senna-docusate HS 11. Insomnia: Start Melatonin HS.  12. Dry eyes: Continue drops.  13. Recreational therapy: Please bring puzzles for her to do.   I have personally performed a face to face diagnostic evaluation, including, but not limited to relevant history and physical exam findings, of this patient and developed relevant assessment and plan.  Additionally, I have reviewed and concur with the physician assistant's documentation above.  Horton Chin, MD 05/13/2020

## 2020-05-13 NOTE — H&P (Signed)
Physical Medicine and Rehabilitation H&P   HPI: [name] is a 79 y.o. female with a PMH of DVT/PE, Takatsubo cardiomyopathy, HTN, OSA, who was admitted syncopal episode and was found to have a submassive PE with saddle emboli and right heart strain. She received tPA and developed left arm hematoma at the infusion site. She required transfusion for acute blood loss anemia. Anticoagulation was discontinued due to drop in hemoglobin. IVC filter was placed on 05/10/20.    Review of Systems  Constitutional: Negative.   HENT: Negative.   Eyes: Negative.   Respiratory: Negative.   Cardiovascular: Negative.   Gastrointestinal: Negative.   Genitourinary: Negative.   Musculoskeletal: Positive for back pain.  Skin: Negative.   Neurological: Positive for weakness.  Endo/Heme/Allergies: Negative.   Psychiatric/Behavioral: The patient has insomnia.    Past Medical History:  Diagnosis Date  . Arthritis   . Asthma   . Chronic back pain   . Depression   . History of pulmonary embolus (PE)   . Hx-TIA (transient ischemic attack)   . Hyperlipemia   . Hypertension   . Personal history of DVT (deep vein thrombosis)   . Scoliosis   . Sleep apnea    cpap - settingsi at 2   . 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 ARTHROSCOPIC REPAIR ACL  02/03/2004  . IR IVC FILTER PLMT / S&I /IMG GUID/MOD SED  05/10/2020  . LEFT HEART CATH AND CORONARY ANGIOGRAPHY N/A 09/16/2019   Procedure: LEFT HEART CATH AND CORONARY ANGIOGRAPHY;  Surgeon: Yvonne Kendall, MD;  Location: [facility] INVASIVE CV LAB;  Service: Cardiovascular;  Laterality: N/A;  . LOOP RECORDER INSERTION N/A 02/21/2020   Procedure: LOOP RECORDER INSERTION;  Surgeon: Duke Salvia, MD;  Location: [facility] INVASIVE CV LAB;  Service: Cardiovascular;  Laterality: N/A;  . OOPHORECTOMY  1970  . SPINE SURGERY  03/31/2006  . TOTAL KNEE ARTHROPLASTY Left 05/17/2016   Procedure: LEFT TOTAL KNEE  ARTHROPLASTY;  Surgeon: Kathryne Hitch, MD;  Location: [facility] ORS;  Service: Orthopedics;  Laterality: Left;  Adductor Block; Spinal to General  . TUBAL LIGATION  1983   Family History  Problem Relation Age of Onset  . Other Father        kidney problems   Social History:  reports that she quit smoking about 46 years ago. Her smoking use included cigarettes. She has a 10.00 pack-year smoking history. She has never used smokeless tobacco. She reports that she does not drink alcohol and does not use drugs. Allergies:  Allergies  Allergen Reactions  . T-Pa [Alteplase] Swelling    Does okay with it if premedicated with benadryl.  . Codeine Itching   Medications Prior to Admission  Medication Sig Dispense Refill  . acetaminophen (TYLENOL) 325 MG tablet Take 2 tablets (650 mg total) by mouth every 4 (four) hours as needed for mild pain (or temp > 37.5 C (99.5 F)).    Marland Kitchen amLODipine (NORVASC) 10 MG tablet Take 10 mg by mouth daily.    Marland Kitchen artificial tears (LACRILUBE) OINT ophthalmic ointment Place into the left eye at bedtime. Apply left eye bedtime 1 Tube 1  . aspirin EC 81 MG EC tablet Take 1 tablet (81 mg total) by mouth daily. 90 tablet 3  . atorvastatin (LIPITOR) 20 MG tablet Take 1 tablet (20 mg total) by mouth at bedtime. 30 tablet 0  . busPIRone (BUSPAR) 30 MG tablet Take 1 tablet (30  mg total) by mouth 2 (two) times daily. 180 tablet 0  . cephALEXin (KEFLEX) 500 MG capsule Take 1 capsule (500 mg total) by mouth every 8 (eight) hours for 2 days. 6 capsule 0  . cholecalciferol (VITAMIN D3) 25 MCG (1000 UNIT) tablet Take 1 tablet (1,000 Units total) by mouth daily. 30 tablet 0  . cycloSPORINE (RESTASIS) 0.05 % ophthalmic emulsion Place 1 drop into both eyes daily as needed (dry eyes). 0.4 mL   . diphenhydramine-acetaminophen (TYLENOL PM) 25-500 MG TABS tablet Take 1 tablet by mouth at bedtime as needed.    . donepezil (ARICEPT) 10 MG tablet Take 1 tablet (10 mg total) by mouth at bedtime.  30 tablet 0  . LINZESS 145 MCG CAPS capsule Take 145 mcg by mouth daily.    Marland Kitchen lisinopril (ZESTRIL) 40 MG tablet Take 40 mg by mouth daily.    . melatonin 3 MG TABS tablet Take 1 tablet (3 mg total) by mouth at bedtime. 30 tablet 0  . memantine (NAMENDA XR) 28 MG CP24 24 hr capsule Take 1 capsule (28 mg total) by mouth daily. 30 capsule 0  . Multiple Vitamins-Minerals (MULTI-VITAMIN GUMMIES PO) Take 1 each by mouth daily.     . nitroGLYCERIN (NITROSTAT) 0.4 MG SL tablet Place 1 tablet (0.4 mg total) under the tongue every 5 (five) minutes as needed for chest pain. 25 tablet 2  . oxyCODONE (ROXICODONE) 15 MG immediate release tablet Take 1 tablet (15 mg total) by mouth 4 (four) times daily as needed (chronic pain). 30 tablet 0  . polyethylene glycol (MIRALAX / GLYCOLAX) 17 g packet Take 17 g by mouth 2 (two) times daily. 14 each 0  . polyvinyl alcohol (LIQUIFILM TEARS) 1.4 % ophthalmic solution Place 2 drops into the left eye 4 (four) times daily. 15 mL 0  . senna-docusate (SENOKOT-S) 8.6-50 MG tablet Take 1 tablet by mouth at bedtime.    . vancomycin (VANCOREADY) 1250 MG/250ML SOLN Inject 250 mLs (1,250 mg total) into the vein daily for 2 days. 500 mL 0   Home: Home Living Family/patient expects to be discharged to:: Private residence Living Arrangements: Other relatives Available Help at Discharge: Family, Available 24 hours/day Type of Home: House Home Access: Level entry Home Layout: One level Bathroom Shower/Tub: Door, Health visitor: Standard Bathroom Accessibility: Yes Home Equipment: Environmental consultant - 2 wheels, Wheelchair - manual, Bedside commode, Shower seat Additional Comments: granddaughter lives with patient  Functional History: Prior Function Level of Independence: Needs assistance Gait / Transfers Assistance Needed: pt ambulates with minG/supervision with RW since D/C from rehab ADL's / Homemaking Assistance Needed: Family assists with transfer to shower, assist with  LB, back and backside.  patient was able to assist with UB and grooming seated. Comments: granddaughter lives with pt and helps her with showers; per nursing, son assists with med prep, community mobility and IADLs. Could perform light home management and meal prep seated with setup. Functional Status:  Mobility: Bed Mobility Overal bed mobility: Needs Assistance Bed Mobility: Supine to Sit Supine to sit: Mod assist General bed mobility comments: +2 to reposition in bed after placement via lift. Transfers Overall transfer level: Needs assistance Equipment used: Ambulation equipment used Transfer via Lift Equipment: Hydrographic surveyor Transfers: Sit to/from Stand Sit to Stand: Mod assist, +2 physical assistance Stand pivot transfers: Total assist General transfer comment: Sit to stand x 1 from bed and x 1 from STEDY.  Required cues for feet placement and leaning forward.  Mod A  x 2 to stand and support L UE.  Cues for posture with assist at buttock and shoulders to facilitate standing tall.  STEDY for pivot  ADL: ADL Overall ADL's : Needs assistance/impaired Eating/Feeding: Set up, Sitting Grooming: Wash/dry hands, Wash/dry face, Minimal assistance, Bed level Upper Body Bathing: Maximal assistance, Bed level Lower Body Bathing: Total assistance, Bed level Upper Body Dressing : Maximal assistance, Bed level Lower Body Dressing: Total assistance, Bed level Toileting- Clothing Manipulation and Hygiene: Total assistance, Bed level Functional mobility during ADLs: Total assistance General ADL Comments: maximove used for back to bed.  Cognition: Cognition Overall Cognitive Status: No family/caregiver present to determine baseline cognitive functioning Orientation Level: Oriented X4 Cognition Arousal/Alertness: Awake/alert Behavior During Therapy: WFL for tasks assessed/performed Overall Cognitive Status: No family/caregiver present to determine baseline cognitive functioning Area of  Impairment: Problem solving, Safety/judgement, Awareness Safety/Judgement: Decreased awareness of safety Awareness: Emergent Problem Solving: Slow processing, Difficulty sequencing, Decreased initiation General Comments: requiring cues for sequence, reassurance, and initiation of transfers  Blood pressure 123/63, pulse 65, temperature 98.4 F (36.9 C), resp. rate 14, SpO2 96 %. Physical Exam  General: Alert and oriented x 2 (not date), No apparent distress HEENT: Head is normocephalic, atraumatic, PERRLA, EOMI, sclera anicteric, oral mucosa pink and moist, dentition intact, ext ear canals clear,  Neck: Supple without JVD or lymphadenopathy Heart: Reg rate and rhythm. No murmurs rubs or gallops Chest: CTA bilaterally without wheezes, rales, or rhonchi; no distress Abdomen: Soft, non-tender, non-distended, bowel sounds positive. Extremities: No clubbing, left arm hematoma- swollen, warm Skin: Clean and intact without signs of breakdown Neuro: Pt is cognitively appropriate with normal insight, memory, and awareness.  Fine motor coordination is intact on right side. Left arm unable to move- swollen due to hematoma. No tremors. Motor function is grossly 4/5 in other extremities.  Musculoskeletal: Right 3rd digit PIP deformity Psych: Pt's affect is appropriate. Pt is cooperative   Results for orders placed or performed during the hospital encounter of 05/05/20 (from the past 24 hour(s))  Glucose, capillary     Status: None   Collection Time: 05/12/20  4:23 PM  Result Value Ref Range   Glucose-Capillary 99 70 - 99 mg/dL   Comment 1 Notify RN    Comment 2 Document in Chart   Glucose, capillary     Status: Abnormal   Collection Time: 05/12/20  8:32 PM  Result Value Ref Range   Glucose-Capillary 109 (H) 70 - 99 mg/dL  Glucose, capillary     Status: Abnormal   Collection Time: 05/13/20 12:20 AM  Result Value Ref Range   Glucose-Capillary 110 (H) 70 - 99 mg/dL  Basic metabolic panel      Status: Abnormal   Collection Time: 05/13/20  2:11 AM  Result Value Ref Range   Sodium 136 135 - 145 mmol/L   Potassium 3.7 3.5 - 5.1 mmol/L   Chloride 102 98 - 111 mmol/L   CO2 24 22 - 32 mmol/L   Glucose, Bld 107 (H) 70 - 99 mg/dL   BUN 12 8 - 23 mg/dL   Creatinine, Ser 2.99 0.44 - 1.00 mg/dL   Calcium 8.6 (L) 8.9 - 10.3 mg/dL   GFR calc non Af Amer >60 >60 mL/min   GFR calc Af Amer >60 >60 mL/min   Anion gap 10 5 - 15  CBC     Status: Abnormal   Collection Time: 05/13/20  2:11 AM  Result Value Ref Range   WBC 11.0 (H) 4.0 -  10.5 K/uL   RBC 3.24 (L) 3.87 - 5.11 MIL/uL   Hemoglobin 10.0 (L) 12.0 - 15.0 g/dL   HCT 15.4 (L) 36 - 46 %   MCV 98.8 80.0 - 100.0 fL   MCH 30.9 26.0 - 34.0 pg   MCHC 31.3 30.0 - 36.0 g/dL   RDW 00.8 (H) 67.6 - 19.5 %   Platelets 176 150 - 400 K/uL   nRBC 1.0 (H) 0.0 - 0.2 %  Magnesium     Status: None   Collection Time: 05/13/20  2:11 AM  Result Value Ref Range   Magnesium 2.0 1.7 - 2.4 mg/dL  Glucose, capillary     Status: None   Collection Time: 05/13/20  4:28 AM  Result Value Ref Range   Glucose-Capillary 91 70 - 99 mg/dL  Glucose, capillary     Status: None   Collection Time: 05/13/20  7:45 AM  Result Value Ref Range   Glucose-Capillary 88 70 - 99 mg/dL  Glucose, capillary     Status: None   Collection Time: 05/13/20 11:56 AM  Result Value Ref Range   Glucose-Capillary 96 70 - 99 mg/dL   No results found.  Medical Assessment/Plan: 1.Debility following submassive PE with septic emboli and right heart strain.      -patient may shower -ELOS/Goals: 14-18 days, mod I to min assist             -Admit to CIR 2. Antithrombotics: -DVT/anticoagulation:IVC filter -antiplatelet therapy: N/A 3. Pain Management:oxycodone PRN for low back pain. Add heating pad and lidocaine patch 4. Mood:Provide emotional support. Denies depression but would like to continue on Buspar.  -antipsychotic agents:  N/A 5. Neuropsych: This patientiscapable of making decisions on herown behalf. 6. Skin/Wound Care:warm compress to right hematoma 7. Fluids/Electrolytes/Nutrition:Routine in and outs with follow-up chemistries 8. Dementia: Continue Aricept and Memantine 9. Right hip cellulitis: continue Keflex and vancomycin.  10. Constipation: Has BM every three days. Continue Linzess, Senna-docusate HS 11. Insomnia: Start Melatonin HS.  12. Dry eyes: Continue drops.  13. Recreational therapy: Please bring puzzles for her to do.   I have personally performed a face to face diagnostic evaluation, including, but not limited to relevant history and physical exam findings, of this patient and developed relevant assessment and plan.  Additionally, I have reviewed and concur with the physician assistant's documentation above.  The patient's status has not changed. The original post admission physician evaluation remains appropriate, and any changes from the pre-admission screening or documentation from the acute chart are noted above.   Horton Chin, MD 05/13/2020

## 2020-05-13 NOTE — Progress Notes (Signed)
Pharmacy Antibiotic Note  [name] is a 79 y.o. female admitted on 05/13/2020 with cellulitis.  Pharmacy has been consulted for vancomycin dosing.    Pt transfer to CIR, on vancomycin from 9/28 with plan on transfer to continue vancomycin and Keflex for 2 more days.  Last dose 1250 mg IV on 10/1 @1800   Plan: Continue vancomycin 1250 mg IV q 24h x 2d  Height: 5\' 2"  (157.5 cm) Weight: 93 kg (205 lb 0.4 oz) IBW/kg (Calculated) : 50.1  Temp (24hrs), Avg:98.3 F (36.8 C), Min:98.1 F (36.7 C), Max:98.6 F (37 C)  Recent Labs  Lab 05/09/20 0333 05/09/20 1832 05/09/20 2030 05/10/20 0425 05/11/20 0243 05/12/20 0311 05/13/20 0211  WBC 12.6*  --  13.7*  --  8.8 11.0* 11.0*  CREATININE  --  0.82  --  0.72 0.70 0.72 0.77    Estimated Creatinine Clearance: 60.6 mL/min (by C-G formula based on SCr of 0.77 mg/dL).    Allergies  Allergen Reactions  . T-Pa [Alteplase] Swelling    Does okay with it if premedicated with benadryl.  . Codeine Itching    07/12/20, PharmD Clinical Pharmacist Please check AMION for all [facility] numbers 05/13/2020 6:24 PM

## 2020-05-13 NOTE — Progress Notes (Signed)
Patient admitted about 1530 to 4W08. Rehab process and therapy information reviewed with patient. Fall safety also reviewed. Call bell left within reach. All questions answered.

## 2020-05-13 NOTE — Discharge Summary (Signed)
Physician Discharge Summary  [name]:[id number] DOB: [birth year] DOA: 05/05/2020  PCP: [name], MD  Admit date: 05/05/2020 Discharge date: 05/13/2020  Admitted From: Home Disposition: CIR  Recommendations for Outpatient Follow-up:  1. Follow up with PCP in 1-2 weeks 2. Please obtain BMP/CBC in 2 days 3. Oral Keflex and vancomycin for 2 more days 4. No anticoagulation for now for pulmonary embolism given left upper extremity hematoma.  Plan to rechallenge in 2 weeks 5. Plavix on hold due to left upper extremity hematoma, in 1-2 weeks can be restarted if stable   Discharge Condition: Stable CODE STATUS: Full code Diet recommendation: Heart healthy  Brief/Interim Summary: 79 year old with history of CVA in 02/2020, Takotsubo cardiomyopathy, DVT/PE, HTN, obstructive sleep apnea admitted for syncopal episode. Found to have submassive PE with saddle emboli and right heart strain. Had elevated troponin and cardiology was consulted who did not suspect ACS. PCCM consulted and patient taken to the ICU where she received TPA.  Complicated by left arm hematoma at the infusion site with some blood loss anemia requiring transfusion.  Due to persistent drop in hemoglobin, anticoagulation was discontinued.  CT abdomen pelvis did not show any retroperitoneal bleed which showed evidence of left upper extremity hematoma and right hip cellulitis.  IVC filter placed on 05/10/2020.  PT consulted who recommended CIR therefore arrangements made.  Rest of the recommendations as stated above.   Assessment & Plan:   Principal Problem:   Elevated troponin Active Problems:   Acute massive pulmonary embolism (HCC)   Takotsubo cardiomyopathy   History of DVT (deep vein thrombosis)   Dementia (HCC)   Near syncope   Pulmonary embolism (HCC)   Cellulitis of right hip  Large bilateral acute pulmonary embolism with saddle emboli/at least submassive PE with cor pulmonale -Status post TPA and  initially on heparin drip but complicated by left upper extremity hematoma/bleeding therefore heparin drip discontinued.  IVC filter placed on 05/10/2020. -Supplemental oxygen. Supportive care.  Rechallenge with anticoagulation and probably about 2 weeks. -Lower extremity Dopplers-bilateral DVTs -Echocardiogram shows EF of 60% with some right heart heart strain and tricuspid regurgitation.  Repeat echocardiogram outpatient in few weeks  Left upper extremity pain, improved according to her -Hematoma seen on the ultrasound.  Advised to keep her arm elevated.  Pain medication and bowel regimen prescribed  Acute blood loss anemia Low back pain, chronic but will rule out retroperitoneal bleed -CT abdomen pelvis is negative for retroperitoneal bleed.    After transfusion hemoglobin remained stable  Dysuria with urinary tract infection, E. coli -Urine cultures growing pansensitive E. coli, 2 more days of p.o. Keflex  Right hip cellulitis without purulent discharge -Continue IV vancomycin for 2 more days to complete 5-day course  Syncope secondary to obstructive shock -CT head negative. Rest of the work-up as indicated above  Elevated troponin -Likely from underlying PE.  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  Macrocytic anemia -Hemoglobin stable  History of CVA -Currently on Plavix and statin. Plavix currently on hold due to left upper extremity hematoma.   Dementia/chronic anxiety -Continue home meds-Namenda, BuSpar  PT recommending CIR, consult placed  Body mass index is 36.69 kg/m.  Discharge Diagnoses:  Principal Problem:   Elevated troponin Active Problems:   Acute massive pulmonary embolism (HCC)   Takotsubo cardiomyopathy   History of DVT (deep vein thrombosis)   Dementia (HCC)   Near syncope   Pulmonary embolism (HCC)  Cellulitis of right hip   Subjective: Seen  and examined at bedside, doing great no complaints besides left upper extremity pain which has been improving  Discharge Exam: Vitals:   05/13/20 0433 05/13/20 0700  BP: (!) 156/74 136/69  Pulse: 63 66  Resp: 17 18  Temp: 98.1 F (36.7 C) 98.5 F (36.9 C)  SpO2: 96% 96%   Vitals:   05/12/20 2300 05/13/20 0000 05/13/20 0433 05/13/20 0700  BP: 102/64 116/74 (!) 156/74 136/69  Pulse: 65 64 63 66  Resp: 17 17 17 18   Temp:  98.1 F (36.7 C) 98.1 F (36.7 C) 98.5 F (36.9 C)  TempSrc: Oral Oral Oral Oral  SpO2: 94% 94% 96% 96%  Weight:   91 kg   Height:        General: Pt is alert, awake, not in acute distress Cardiovascular: RRR, S1/S2 +, no rubs, no gallops Respiratory: CTA bilaterally, no wheezing, no rhonchi Abdominal: Soft, NT, ND, bowel sounds + Extremities: no edema, no cyanosis Left upper extremity has been swollen without any signs of obvious infection.  There is some hematoma/ecchymosis.  No loss of sensation or evidence of vascular compromise.  Discharge Instructions   Allergies as of 05/13/2020      Reactions   T-pa [alteplase] Swelling   Does okay with it if premedicated with benadryl.   Codeine Itching      Medication List    STOP taking these medications   clopidogrel 75 MG tablet Commonly known as: PLAVIX   mirtazapine 15 MG tablet Commonly known as: REMERON     TAKE these medications   acetaminophen 325 MG tablet Commonly known as: TYLENOL Take 2 tablets (650 mg total) by mouth every 4 (four) hours as needed for mild pain (or temp > 37.5 C (99.5 F)).   amLODipine 10 MG tablet Commonly known as: NORVASC Take 10 mg by mouth daily.   artificial tears Oint ophthalmic ointment Commonly known as: LACRILUBE Place into the left eye at bedtime. Apply left eye bedtime   aspirin 81 MG EC tablet Take 1 tablet (81 mg total) by mouth daily.   atorvastatin 20 MG tablet Commonly known as: LIPITOR Take 1 tablet (20 mg total) by mouth at bedtime.    busPIRone 30 MG tablet Commonly known as: BUSPAR Take 1 tablet (30 mg total) by mouth 2 (two) times daily.   cephALEXin 500 MG capsule Commonly known as: KEFLEX Take 1 capsule (500 mg total) by mouth every 8 (eight) hours for 2 days.   cholecalciferol 25 MCG (1000 UNIT) tablet Commonly known as: VITAMIN D3 Take 1 tablet (1,000 Units total) by mouth daily.   diphenhydramine-acetaminophen 25-500 MG Tabs tablet Commonly known as: TYLENOL PM Take 1 tablet by mouth at bedtime as needed.   donepezil 10 MG tablet Commonly known as: ARICEPT Take 1 tablet (10 mg total) by mouth at bedtime.   Linzess 145 MCG Caps capsule Generic drug: linaclotide Take 145 mcg by mouth daily.   lisinopril 40 MG tablet Commonly known as: ZESTRIL Take 40 mg by mouth daily.   melatonin 3 MG Tabs tablet Take 1 tablet (3 mg total) by mouth at bedtime.   memantine 28 MG Cp24 24 hr capsule Commonly known as: NAMENDA XR Take 1 capsule (28 mg total) by mouth daily.   MULTI-VITAMIN GUMMIES PO Take 1 each by mouth daily.   nitroGLYCERIN 0.4 MG SL tablet Commonly known as: NITROSTAT Place 1 tablet (0.4 mg total) under the tongue every 5 (  five) minutes as needed for chest pain.   oxyCODONE 15 MG immediate release tablet Commonly known as: ROXICODONE Take 1 tablet (15 mg total) by mouth 4 (four) times daily as needed (chronic pain). What changed: Another medication with the same name was removed. Continue taking this medication, and follow the directions you see here.   polyethylene glycol 17 g packet Commonly known as: MIRALAX / GLYCOLAX Take 17 g by mouth 2 (two) times daily.   polyvinyl alcohol 1.4 % ophthalmic solution Commonly known as: LIQUIFILM TEARS Place 2 drops into the left eye 4 (four) times daily.   Restasis 0.05 % ophthalmic emulsion Generic drug: cycloSPORINE Place 1 drop into both eyes daily as needed (dry eyes).   senna-docusate 8.6-50 MG tablet Commonly known as: Senokot-S Take  1 tablet by mouth at bedtime.   vancomycin 1250 MG/250ML Soln Commonly known as: VANCOREADY Inject 250 mLs (1,250 mg total) into the vein daily for 2 days.       Follow-up Information    [name], MD. Schedule an appointment as soon as possible for a visit in 1 week(s).   Specialty: Internal Medicine Contact information: [address] [place] [address] [phone]        Jodelle Red, MD .   Specialty: Cardiology Contact information: [address] [place] [address] [phone]              Allergies  Allergen Reactions  . T-Pa [Alteplase] Swelling    Does okay with it if premedicated with benadryl.  . Codeine Itching    You were cared for by a hospitalist during your hospital stay. If you have any questions about your discharge medications or the care you received while you were in the hospital after you are discharged, you can call the unit and asked to speak with the hospitalist on call if the hospitalist that took care of you is not available. Once you are discharged, your primary care physician will handle any further medical issues. Please note that no refills for any discharge medications will be authorized once you are discharged, as it is imperative that you return to your primary care physician (or establish a relationship with a primary care physician if you do not have one) for your aftercare needs so that they can reassess your need for medications and monitor your lab values.   Procedures/Studies: CT ABDOMEN PELVIS WO CONTRAST  Result Date: 05/09/2020 CLINICAL DATA:  Abdominal distension and lower back pain. EXAM: CT ABDOMEN AND PELVIS WITHOUT CONTRAST TECHNIQUE: Multidetector CT imaging of the abdomen and pelvis was performed following the standard protocol without IV contrast. COMPARISON:  None. FINDINGS: Lower chest: Very mild atelectasis is seen within the posterior aspects of the bilateral lung bases.  Hepatobiliary: No focal liver abnormality is seen. The gallbladder is moderately distended. No gallstones, gallbladder wall thickening, or biliary dilatation. Pancreas: Unremarkable. No pancreatic ductal dilatation or surrounding inflammatory changes. Spleen: Normal in size without focal abnormality. Adrenals/Urinary Tract: Adrenal glands are unremarkable. Kidneys are normal, without renal calculi, focal lesion, or hydronephrosis. Bladder is unremarkable. Stomach/Bowel: Stomach is within normal limits. Appendix appears normal. No evidence of bowel wall thickening, distention, or inflammatory changes. Noninflamed diverticula are seen within the sigmoid colon. Vascular/Lymphatic: There is moderate severity calcification and tortuosity of the abdominal aorta and bilateral common iliac arteries, without evidence of aneurysmal dilatation. No enlarged abdominal or pelvic lymph nodes. Reproductive: Uterus and bilateral adnexa are unremarkable. Other: No abdominal wall hernia or abnormality. No  abdominopelvic ascites. Musculoskeletal: A 5.8 cm x 6.2 cm area (incompletely imaged) of intramuscular low attenuation is seen along the medial aspect of the left upper extremity. This is adjacent to the mid and distal portions of the left humeral shaft (axial CT images 81 through 18, CT series number 3). A moderate amount of adjacent deep subcutaneous inflammatory fat stranding is seen. Moderate severity subcutaneous inflammatory fat stranding is seen along the lateral aspect of the right hip and right hemipelvis. There is marked severity levoscoliosis of the lumbar spine. Bilateral metallic density pedicle screws are seen from the levels of L2 through S1. An extensive amount of associated streak artifact is seen. Subsequently limited evaluation of the contents of the abdomen and pelvis is noted. IMPRESSION: 1. Large area of intramuscular low attenuation along the medial aspect of the left upper extremity which may represent an  evolving hematoma. An infectious or neoplastic process cannot be excluded. MRI correlation is recommended. 2. Moderate severity cellulitis along the lateral aspect of the right hip and right hemipelvis. 3. Sigmoid diverticulosis without evidence of diverticulitis. 4. 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 5. Aortic atherosclerosis. Aortic Atherosclerosis (ICD10-I70.0). Electronically Signed   By: Aram Candela M.D.   On: 05/09/2020 16:16   CT Head Wo Contrast  Result Date: 05/05/2020 CLINICAL DATA:  Mental status change.  Somnolent. EXAM: CT HEAD WITHOUT CONTRAST TECHNIQUE: Contiguous axial images were obtained from the base of the skull through the vertex without intravenous contrast. COMPARISON:  Head CT and brain MRI 02/17/2020 FINDINGS: Brain: No intracranial hemorrhage, mass effect, or midline shift. 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 No hydrocephalus. The basilar cisterns are patent. Small left superior cerebellar infarcts unchanged from prior. 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 The right pontine infarct on prior is not well visualized. Unchanged chronic small vessel ischemia. No evidence of territorial infarct or acute ischemia. No extra-axial or intracranial fluid collection. Vascular: No hyperdense vessel. Skull: No fracture or focal lesion.  Bifrontal hyperostosis. Sinuses/Orbits: No acute findings. Other: None. IMPRESSION: 1. No acute intracranial abnormality. 2. Stable chronic small vessel ischemia, remote left cerebellar infarct. Pontine infarcts that were acute on July exam demonstrate minimal encephalomalacia on the left, and is not seen on the right. Electronically Signed   By: Narda Rutherford M.D.   On: 05/05/2020 21:04   CT Angio Chest PE W and/or Wo Contrast  Result Date: 05/05/2020 CLINICAL DATA:  Suspected pulmonary embolus with low to intermediate  probability. Positive D-dimer. Near syncope. EXAM: CT ANGIOGRAPHY CHEST WITH CONTRAST TECHNIQUE: Multidetector CT imaging of the chest was performed using the standard protocol during bolus administration of intravenous contrast. Multiplanar CT image reconstructions and MIPs were obtained to evaluate the vascular anatomy. CONTRAST:  80mL OMNIPAQUE IOHEXOL 350 MG/ML SOLN COMPARISON:  Chest CT 08/26/2018.  Chest radiograph 05/05/2020 FINDINGS: Cardiovascular: Good opacification of the central and segmental pulmonary arteries. Large filling defects are demonstrated in the central, lobar, and segmental pulmonary arteries bilaterally consistent with acute pulmonary embolus including saddle embolus and large clot burden. The RV to LV ratio is elevated at 2.8 and there is significant reflux of contrast material into the hepatic veins, suggesting right heart strain. No pericardial effusions. Calcification of the aorta. Ascending aorta measures 4.2 cm diameter. Mediastinum/Nodes: No significant lymphadenopathy. Esophagus is decompressed. Lungs/Pleura: 3 mm nodule in the right mid lung likely calcified. Mild atelectasis in the lung bases. No focal consolidation.  No pleural effusions. No pneumothorax. Upper Abdomen: No acute abnormalities demonstrated in the visualized upper abdomen. Musculoskeletal: Lumbar scoliosis with postoperative change. Degenerative changes. Anterior compression of T11 and T12, chronic. No destructive bone lesions. Review of the MIP images confirms the above findings. IMPRESSION: 1. Large bilateral acute pulmonary embolus including saddle embolus and large clot burden. CT evidence of right heart strain (RV/LV Ratio = 2.8) consistent with at least submassive (intermediate risk) PE. The presence of right heart strain has been associated with an increased risk of morbidity and mortality. 2. Mild atelectasis in the lung bases. 3. Aortic atherosclerosis. Aortic Atherosclerosis (ICD10-I70.0). Critical  Value/emergent results were called by telephone at the time of interpretation on 05/05/2020 at 9:05 pm to provider PA [place], who verbally acknowledged these results. Electronically Signed   By: Burman Nieves M.D.   On: 05/05/2020 21:09   IR IVC FILTER PLMT / S&I Lenise Arena GUID/MOD SED  Result Date: 05/10/2020 CLINICAL DATA:  79 year old female with history of DVT, PE, and large left upper extremity hematoma. EXAM: 1. ULTRASOUND GUIDANCE FOR VASCULAR ACCESS OF THE RIGHT internal jugular VEIN. 2. IVC VENOGRAM. 3. PERCUTANEOUS IVC FILTER PLACEMENT. ANESTHESIA/SEDATION: 1 mg IV Versed; 75 mcg IV Fentanyl. Total Moderate Sedation Time Thirteenminutes. CONTRAST:  85mL OMNIPAQUE IOHEXOL 300 MG/ML  SOLN FLUOROSCOPY TIME:  4 minutes, 24 seconds, 134 mGy PROCEDURE: The procedure, risks, benefits, and alternatives were explained to the patient. Questions regarding the procedure were encouraged and answered. The patient understands and consents to the procedure. The patient was prepped with Betadine in a sterile fashion, and a sterile drape was applied covering the operative field. A sterile gown and sterile gloves were used for the procedure. Local anesthesia was provided with 1% Lidocaine. Under direct ultrasound guidance, a 21 gauge needle was advanced into the right internal jugular vein with ultrasound image documentation performed. After securing access with a micropuncture dilator, a guidewire was advanced into the inferior vena cava. A deployment sheath was advanced over the guidewire. This was utilized to perform IVC venography. The deployment sheath was further positioned in an appropriate location for filter deployment. A Denali IVC filter was then advanced in the sheath. This was then fully deployed in the infrarenal IVC. Final filter position was confirmed with a fluoroscopic spot image. Contrast injection was also performed through the sheath under fluoroscopy to confirm patency of the IVC at the level of the  filter. The apex of the filter with was against the left lateral wall, just inferior to the left renal vein. Therefore the indwelling filter deployment sheath was exchanged for an 11 French coaxial retrieval sheath system, through which a loop snare was used to capture the apex hook and reposition the filter to a more and line position with the infrarenal segment of the inferior vena cava. Repeat contrast injection through the indwelling sheath demonstrated better positioning of the filter. After the procedure the sheath was removed and hemostasis obtained with manual compression. COMPLICATIONS: None. FINDINGS: IVC venography demonstrates a normal caliber IVC with no evidence of thrombus. Renal veins are identified bilaterally. The IVC filter was successfully positioned below the level of the renal veins and is appropriately oriented. This IVC filter has both permanent and retrievable indications. IMPRESSION: Placement of percutaneous IVC filter in infrarenal IVC. IVC venogram shows no evidence of IVC thrombus and normal caliber of the inferior vena cava. This filter does have both permanent and retrievable indications. PLAN: This IVC filter is potentially retrievable. The patient will be assessed for filter  retrieval by Interventional Radiology in approximately 8-12 weeks. Further recommendations regarding filter retrieval, continued surveillance or declaration of device permanence, will be made at that time. Marliss Coots, MD [facility] Electronically Signed   By: Marliss Coots MD   On: 05/10/2020 13:09   DG Chest Portable 1 View  Result Date: 05/05/2020 CLINICAL DATA:  Near syncope EXAM: PORTABLE CHEST 1 VIEW COMPARISON:  November 07, 2019 FINDINGS: The heart size is enlarged. Aortic calcifications are noted. The pulmonary arteries appear to be dilated which can be seen in patients with elevated pulmonary artery pressures. There is no pneumothorax or  large pleural effusion. There is a device projecting over the patient's left ventricle which may represent a loop recorder or lead less pacemaker. There is no acute displaced fracture. There are degenerative changes of both glenohumeral joints. IMPRESSION: No active disease. Electronically Signed   By: Katherine Mantle M.D.   On: 05/05/2020 16:56   ECHOCARDIOGRAM COMPLETE  Result Date: 05/06/2020    ECHOCARDIOGRAM REPORT   Patient Name:   [name] Date of Exam: 05/06/2020 Medical Rec #:  [id number]        Height:       62.0 in Accession #:    [id number]       Weight:       183.6 lb Date of Birth:  1940-12-28       BSA:          1.844 m Patient Age:    78 years         BP:           132/82 mmHg Patient Gender: F                HR:           59 bpm. 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 CAD; Risk Factors:Sleep Apnea, Hypertension and                 Dyslipidemia.  Sonographer:    Irving Burton Senior RDCS Referring Phys: [id number] Aliene Beams  Sonographer Comments: Technically difficult due to poor echo windows. IMPRESSIONS  1. Left ventricular ejection fraction, by estimation, is 60 to 65%. The left ventricle has normal function. The left ventricle has no regional wall motion abnormalities. There is mild left ventricular hypertrophy. Left ventricular diastolic parameters are consistent with Grade I diastolic dysfunction (impaired relaxation).  2. Findings suggest mild RV dysfunction.. Right ventricular systolic function is mildly reduced. The right ventricular size is mildly enlarged. There is mildly elevated pulmonary artery systolic pressure. The estimated right ventricular systolic pressure is 40.7 mmHg.  3. Left atrial size was mildly dilated.  4. The mitral valve is abnormal. Trivial mitral valve  regurgitation.  5. Tricuspid valve regurgitation is mild to moderate.  6. The aortic valve is tricuspid. Aortic valve regurgitation is mild. Mild aortic valve sclerosis is present, with no evidence of aortic valve stenosis.  7. The inferior vena cava is normal in size with greater than 50% respiratory variability, suggesting right atrial pressure of 3 mmHg. FINDINGS  Left Ventricle: Left ventricular ejection fraction, by estimation, is 60 to 65%. The left ventricle has normal function. The left  ventricle has no regional wall motion abnormalities. The left ventricular internal cavity size was normal in size. There is  mild left ventricular hypertrophy. Left ventricular diastolic parameters are consistent with Grade I diastolic dysfunction (impaired relaxation). Normal left ventricular filling pressure. Right Ventricle: Findings suggest mild RV dysfunction. The right ventricular size is mildly enlarged. No increase in right ventricular wall thickness. Right ventricular systolic function is mildly reduced. There is mildly elevated pulmonary artery systolic pressure. The tricuspid regurgitant velocity is 3.07 m/s, and with an assumed right atrial pressure of 3 mmHg, the estimated right ventricular systolic pressure is 40.7 mmHg. Left Atrium: Left atrial size was mildly dilated. Right Atrium: Right atrial size was normal in size. Pericardium: There is no evidence of pericardial effusion. Mitral Valve: The mitral valve is abnormal. There is mild thickening of the mitral valve leaflet(s). Trivial mitral valve regurgitation. Tricuspid Valve: The tricuspid valve is grossly normal. Tricuspid valve regurgitation is mild to moderate. Aortic Valve: The aortic valve is tricuspid. Aortic valve regurgitation is mild. Aortic regurgitation PHT measures 688 msec. Mild aortic valve sclerosis is present, with no evidence of aortic valve stenosis. Pulmonic Valve: The pulmonic valve was grossly normal. Pulmonic valve regurgitation is  trivial. Aorta: The aortic root and ascending aorta are structurally normal, with no evidence of dilitation. Venous: The inferior vena cava is normal in size with greater than 50% respiratory variability, suggesting right atrial pressure of 3 mmHg. IAS/Shunts: No atrial level shunt detected by color flow Doppler.  LEFT VENTRICLE PLAX 2D LVIDd:         3.70 cm  Diastology LVIDs:         2.40 cm  LV e' medial:    3.70 cm/s LV PW:         0.80 cm  LV E/e' medial:  13.3 LV IVS:        1.20 cm  LV e' lateral:   7.51 cm/s LVOT diam:     2.20 cm  LV E/e' lateral: 6.6 LV SV:         69 LV SV Index:   37 LVOT Area:     3.80 cm  RIGHT VENTRICLE RV S prime:     14.60 cm/s TAPSE (M-mode): 1.8 cm LEFT ATRIUM             Index       RIGHT ATRIUM           Index LA diam:        3.10 cm 1.68 cm/m  RA Area:     16.50 cm LA Vol (A2C):   40.0 ml 21.70 ml/m RA Volume:   36.00 ml  19.53 ml/m LA Vol (A4C):   79.7 ml 43.23 ml/m LA Biplane Vol: 57.0 ml 30.92 ml/m  AORTIC VALVE LVOT Vmax:   81.60 cm/s LVOT Vmean:  63.700 cm/s LVOT VTI:    0.181 m AI PHT:      688 msec  AORTA Ao Root diam: 3.60 cm Ao Asc diam:  3.40 cm MITRAL VALVE               TRICUSPID VALVE MV Area (PHT): 1.87 cm    TR Peak grad:   37.7 mmHg MV Decel Time: 405 msec    TR Vmax:        307.00 cm/s MV E velocity: 49.30 cm/s MV A velocity: 49.30 cm/s  SHUNTS MV E/A ratio:  1.00        Systemic VTI:  0.18 m  Systemic Diam: 2.20 cm Zoila Shutter MD Electronically signed by Zoila Shutter MD Signature Date/Time: 05/06/2020/10:41:18 AM    Final    [place] LT UPPER EXTREM LTD SOFT TISSUE NON VASCULAR  Result Date: 05/07/2020 CLINICAL DATA:  Bruising and swelling in the medial left upper arm and forearm. EXAM: ULTRASOUND left UPPER EXTREMITY LIMITED TECHNIQUE: Ultrasound examination of the upper extremity soft tissues was performed in the area of clinical concern. COMPARISON:  None. FINDINGS: In the left upper arm there is a elongated 23.5 x 5.7 x  5.0 cm fluid collection all along the entire length of the upper arm. Some internal echogenic debris but enhanced through transmission and no blood flow. The forearm area demonstrates edematous soft tissues but no discrete fluid collection. IMPRESSION: 1. Large elongated subcutaneous fluid collection involving the left upper extremity. This could be hematoma or abscess. 2. Diffuse inflammatory/edematous changes in the forearm area without discrete fluid collection. Electronically Signed   By: Rudie Meyer M.D.   On: 05/07/2020 13:38   VAS [place] LOWER EXTREMITY VENOUS (DVT)  Result Date: 05/07/2020  Lower Venous DVT Study Indications: Pulmonary embolism, and Edema.  Limitations: Patient's inability to rotate leg. Comparison Study: Prior study done 02/20/20 and is available for comparison Performing Technologist: Sherren Kerns RVS  Examination Guidelines: A complete evaluation includes B-mode imaging, spectral Doppler, color Doppler, and power Doppler as needed of all accessible portions of each vessel. Bilateral testing is considered an integral part of a complete examination. Limited examinations for reoccurring indications may be performed as noted. The reflux portion of the exam is performed with the patient in reverse Trendelenburg.  +---------+---------------+---------+-----------+----------+--------------+ RIGHT    CompressibilityPhasicitySpontaneityPropertiesThrombus Aging +---------+---------------+---------+-----------+----------+--------------+ CFV      Full           Yes      Yes                                 +---------+---------------+---------+-----------+----------+--------------+ SFJ      Full                                                        +---------+---------------+---------+-----------+----------+--------------+ FV Prox  Full                                                        +---------+---------------+---------+-----------+----------+--------------+ FV  Mid   Full                                                        +---------+---------------+---------+-----------+----------+--------------+ FV DistalFull                                                        +---------+---------------+---------+-----------+----------+--------------+ PFV      Full                                                        +---------+---------------+---------+-----------+----------+--------------+  POP      None           No       No                   Acute          +---------+---------------+---------+-----------+----------+--------------+ PTV      None           No       No                   Acute          +---------+---------------+---------+-----------+----------+--------------+ PERO     None           No       No                   Acute          +---------+---------------+---------+-----------+----------+--------------+   +---------+---------------+---------+-----------+----------+-------------------+ LEFT     CompressibilityPhasicitySpontaneityPropertiesThrombus Aging      +---------+---------------+---------+-----------+----------+-------------------+ CFV      Full           Yes      Yes                                      +---------+---------------+---------+-----------+----------+-------------------+ SFJ      Full                                                             +---------+---------------+---------+-----------+----------+-------------------+ FV Prox  Full                                                             +---------+---------------+---------+-----------+----------+-------------------+ FV Mid   Full                                                             +---------+---------------+---------+-----------+----------+-------------------+ FV DistalFull                                                              +---------+---------------+---------+-----------+----------+-------------------+ PFV      Full                                                             +---------+---------------+---------+-----------+----------+-------------------+ POP                     Yes      Yes  patent by color and                                                       Doppler             +---------+---------------+---------+-----------+----------+-------------------+ PTV      None                                         Acute               +---------+---------------+---------+-----------+----------+-------------------+ PERO     None                                         Acute               +---------+---------------+---------+-----------+----------+-------------------+     Summary: RIGHT: - Findings consistent with acute deep vein thrombosis involving the right popliteal vein, right posterior tibial veins, and right peroneal veins. - A cystic structure is found in the popliteal fossa.  LEFT: - Findings consistent with acute deep vein thrombosis involving the left posterior tibial veins, and left peroneal veins.  *See table(s) above for measurements and observations. Electronically signed by Waverly Ferrari MD on 05/07/2020 at 7:12:53 AM.    Final       The results of significant diagnostics from this hospitalization (including imaging, microbiology, ancillary and laboratory) are listed below for reference.     Microbiology: Recent Results (from the past 240 hour(s))  Respiratory Panel by RT PCR (Flu A&B, Covid) - Nasopharyngeal Swab     Status: None   Collection Time: 05/05/20  4:18 PM   Specimen: Nasopharyngeal Swab  Result Value Ref Range Status   SARS Coronavirus 2 by RT PCR NEGATIVE NEGATIVE Final    Comment: (NOTE) SARS-CoV-2 target nucleic acids are NOT DETECTED.  The SARS-CoV-2 RNA is generally detectable in upper respiratoy specimens during the acute phase  of infection. The lowest concentration of SARS-CoV-2 viral copies this assay can detect is 131 copies/mL. A negative result does not preclude SARS-Cov-2 infection and should not be used as the sole basis for treatment or other patient management decisions. A negative result may occur with  improper specimen collection/handling, submission of specimen other than nasopharyngeal swab, presence of viral mutation(s) within the areas targeted by this assay, and inadequate number of viral copies (<131 copies/mL). A negative result must be combined with clinical observations, patient history, and epidemiological information. The expected result is Negative.  Fact Sheet for Patients:  https://www.moore.com/  Fact Sheet for Healthcare Providers:  https://www.young.biz/  This test is no t yet approved or cleared by the [place] FDA and  has been authorized for detection and/or diagnosis of SARS-CoV-2 by FDA under an Emergency Use Authorization (EUA). This EUA will remain  in effect (meaning this test can be used) for the duration of the COVID-19 declaration under Section 564(b)(1) of the Act, 21 U.S.C. section 360bbb-3(b)(1), unless the authorization is terminated or revoked sooner.     Influenza A by PCR NEGATIVE NEGATIVE Final   Influenza B by PCR NEGATIVE NEGATIVE Final    Comment: (NOTE) The Xpert Xpress  SARS-CoV-2/FLU/RSV assay is intended as an aid in  the diagnosis of influenza from Nasopharyngeal swab specimens and  should not be used as a sole basis for treatment. Nasal washings and  aspirates are unacceptable for Xpert Xpress SARS-CoV-2/FLU/RSV  testing.  Fact Sheet for Patients: https://www.moore.com/  Fact Sheet for Healthcare Providers: https://www.young.biz/  This test is not yet approved or cleared by the [place] FDA and  has been authorized for detection and/or diagnosis of  SARS-CoV-2 by  FDA under an Emergency Use Authorization (EUA). This EUA will remain  in effect (meaning this test can be used) for the duration of the  Covid-19 declaration under Section 564(b)(1) of the Act, 21  U.S.C. section 360bbb-3(b)(1), unless the authorization is  terminated or revoked. Performed at [facility], [address] [place], [place] [address]   MRSA PCR Screening     Status: None   Collection Time: 05/05/20 11:21 PM   Specimen: Nasopharyngeal  Result Value Ref Range Status   MRSA by PCR NEGATIVE NEGATIVE Final    Comment:        The GeneXpert MRSA Assay (FDA approved for NASAL specimens only), is one component of a comprehensive MRSA colonization surveillance program. It is not intended to diagnose MRSA infection nor to guide or monitor treatment for MRSA infections. Performed at [facility], [address], [place], [place] [address]   Culture, Urine     Status: Abnormal   Collection Time: 05/08/20 10:12 AM   Specimen: Urine, Catheterized  Result Value Ref Range Status   Specimen Description URINE, CATHETERIZED  Final   Special Requests   Final    NONE Performed at [facility], [address], [place], [place] [address]    Culture >=100,000 COLONIES/mL ESCHERICHIA COLI (A)  Final   Report Status 05/10/2020 FINAL  Final   Organism ID, Bacteria ESCHERICHIA COLI (A)  Final      Susceptibility   Escherichia coli - MIC*    AMPICILLIN <=2 SENSITIVE Sensitive     CEFAZOLIN <=4 SENSITIVE Sensitive     CEFTRIAXONE <=0.25 SENSITIVE Sensitive     CIPROFLOXACIN <=0.25 SENSITIVE Sensitive     GENTAMICIN <=1 SENSITIVE Sensitive     IMIPENEM <=0.25 SENSITIVE Sensitive     NITROFURANTOIN <=16 SENSITIVE Sensitive     TRIMETH/SULFA <=20 SENSITIVE Sensitive     AMPICILLIN/SULBACTAM <=2 SENSITIVE Sensitive     PIP/TAZO <=4 SENSITIVE Sensitive     * >=100,000 COLONIES/mL ESCHERICHIA COLI     Labs: BNP (last 3 results) Recent Labs     09/16/19 1410 05/06/20 0123  BNP 91.2 175.9*   Basic Metabolic Panel: Recent Labs  Lab 05/09/20 1832 05/10/20 0425 05/11/20 0243 05/12/20 0311 05/13/20 0211  NA 137 139 139 141 136  K 4.0 3.8 3.6 3.5 3.7  CL 104 105 106 106 102  CO2 24 24 25 25 24   GLUCOSE 153* 110* 99 87 107*  BUN 12 11 10 11 12   CREATININE 0.82 0.72 0.70 0.72 0.77  CALCIUM 8.4* 8.4* 8.2* 8.5* 8.6*  MG  --   --   --  2.0 2.0   Liver Function Tests: No results for input(s): AST, ALT, ALKPHOS, BILITOT, PROT, ALBUMIN in the last 168 hours. No results for input(s): LIPASE, AMYLASE in the last 168 hours. No results for input(s): AMMONIA in the last 168 hours. CBC: Recent Labs  Lab 05/09/20 9147 05/09/20 8295 05/09/20 2030 05/11/20 0243 05/11/20 1704 05/12/20 6213 05/13/20 0211  WBC 12.6*  --  13.7* 8.8  --  11.0* 11.0*  HGB 6.8*   < > 8.1* 6.5* 9.7* 9.9* 10.0*  HCT 21.8*   < > 25.0* 21.3* 29.7* 31.8* 32.0*  MCV 102.8*  --  97.7 100.9*  --  97.8 98.8  PLT PLATELET CLUMPS NOTED ON SMEAR, UNABLE TO ESTIMATE  --  181 152  --  152 176   < > = values in this interval not displayed.   Cardiac Enzymes: No results for input(s): CKTOTAL, CKMB, CKMBINDEX, TROPONINI in the last 168 hours. BNP: Invalid input(s): POCBNP CBG: Recent Labs  Lab 05/12/20 1623 05/12/20 2032 05/13/20 0020 05/13/20 0428 05/13/20 0745  GLUCAP 99 109* 110* 91 88   D-Dimer No results for input(s): DDIMER in the last 72 hours. Hgb A1c No results for input(s): HGBA1C in the last 72 hours. Lipid Profile No results for input(s): CHOL, HDL, LDLCALC, TRIG, CHOLHDL, LDLDIRECT in the last 72 hours. Thyroid function studies No results for input(s): TSH, T4TOTAL, T3FREE, THYROIDAB in the last 72 hours.  Invalid input(s): FREET3 Anemia work up No results for input(s): VITAMINB12, FOLATE, FERRITIN, TIBC, IRON, RETICCTPCT in the last 72 hours. Urinalysis    Component Value Date/Time   COLORURINE AMBER (A) 05/07/2020 0532    APPEARANCEUR HAZY (A) 05/07/2020 0532   LABSPEC 1.030 05/07/2020 0532   PHURINE 5.0 05/07/2020 0532   GLUCOSEU NEGATIVE 05/07/2020 0532   HGBUR NEGATIVE 05/07/2020 0532   BILIRUBINUR NEGATIVE 05/07/2020 0532   KETONESUR NEGATIVE 05/07/2020 0532   PROTEINUR NEGATIVE 05/07/2020 0532   UROBILINOGEN 1.0 02/14/2012 1054   NITRITE NEGATIVE 05/07/2020 0532   LEUKOCYTESUR MODERATE (A) 05/07/2020 0532   Sepsis Labs Invalid input(s): PROCALCITONIN,  WBC,  LACTICIDVEN Microbiology Recent Results (from the past 240 hour(s))  Respiratory Panel by RT PCR (Flu A&B, Covid) - Nasopharyngeal Swab     Status: None   Collection Time: 05/05/20  4:18 PM   Specimen: Nasopharyngeal Swab  Result Value Ref Range Status   SARS Coronavirus 2 by RT PCR NEGATIVE NEGATIVE Final    Comment: (NOTE) SARS-CoV-2 target nucleic acids are NOT DETECTED.  The SARS-CoV-2 RNA is generally detectable in upper respiratoy specimens during the acute phase of infection. The lowest concentration of SARS-CoV-2 viral copies this assay can detect is 131 copies/mL. A negative result does not preclude SARS-Cov-2 infection and should not be used as the sole basis for treatment or other patient management decisions. A negative result may occur with  improper specimen collection/handling, submission of specimen other than nasopharyngeal swab, presence of viral mutation(s) within the areas targeted by this assay, and inadequate number of viral copies (<131 copies/mL). A negative result must be combined with clinical observations, patient history, and epidemiological information. The expected result is Negative.  Fact Sheet for Patients:  https://www.moore.com/  Fact Sheet for Healthcare Providers:  https://www.young.biz/  This test is no t yet approved or cleared by the [place] FDA and  has been authorized for detection and/or diagnosis of SARS-CoV-2 by FDA under an Emergency Use  Authorization (EUA). This EUA will remain  in effect (meaning this test can be used) for the duration of the COVID-19 declaration under Section 564(b)(1) of the Act, 21 U.S.C. section 360bbb-3(b)(1), unless the authorization is terminated or revoked sooner.     Influenza A by PCR NEGATIVE NEGATIVE Final   Influenza B by PCR NEGATIVE NEGATIVE Final    Comment: (NOTE) The Xpert Xpress SARS-CoV-2/FLU/RSV assay is intended as an aid in  the diagnosis of influenza from Nasopharyngeal swab specimens and  should not be used as a sole basis for treatment. Nasal washings and  aspirates are unacceptable for Xpert Xpress SARS-CoV-2/FLU/RSV  testing.  Fact Sheet for Patients: https://www.moore.com/  Fact Sheet for Healthcare Providers: https://www.young.biz/  This test is not yet approved or cleared by the [place] FDA and  has been authorized for detection and/or diagnosis of SARS-CoV-2 by  FDA under an Emergency Use Authorization (EUA). This EUA will remain  in effect (meaning this test can be used) for the duration of the  Covid-19 declaration under Section 564(b)(1) of the Act, 21  U.S.C. section 360bbb-3(b)(1), unless the authorization is  terminated or revoked. Performed at [facility], [address] [place], [place] [address]   MRSA PCR Screening     Status: None   Collection Time: 05/05/20 11:21 PM   Specimen: Nasopharyngeal  Result Value Ref Range Status   MRSA by PCR NEGATIVE NEGATIVE Final    Comment:        The GeneXpert MRSA Assay (FDA approved for NASAL specimens only), is one component of a comprehensive MRSA colonization surveillance program. It is not intended to diagnose MRSA infection nor to guide or monitor treatment for MRSA infections. Performed at [facility], [address], [place], [place] [address]   Culture, Urine     Status: Abnormal   Collection Time: 05/08/20 10:12 AM   Specimen: Urine,  Catheterized  Result Value Ref Range Status   Specimen Description URINE, CATHETERIZED  Final   Special Requests   Final    NONE Performed at [facility], [address], [place], [place] [address]    Culture >=100,000 COLONIES/mL ESCHERICHIA COLI (A)  Final   Report Status 05/10/2020 FINAL  Final   Organism ID, Bacteria ESCHERICHIA COLI (A)  Final      Susceptibility   Escherichia coli - MIC*    AMPICILLIN <=2 SENSITIVE Sensitive     CEFAZOLIN <=4 SENSITIVE Sensitive     CEFTRIAXONE <=0.25 SENSITIVE Sensitive     CIPROFLOXACIN <=0.25 SENSITIVE Sensitive     GENTAMICIN <=1 SENSITIVE Sensitive     IMIPENEM <=0.25 SENSITIVE Sensitive     NITROFURANTOIN <=16 SENSITIVE Sensitive     TRIMETH/SULFA <=20 SENSITIVE Sensitive     AMPICILLIN/SULBACTAM <=2 SENSITIVE Sensitive     PIP/TAZO <=4 SENSITIVE Sensitive     * >=100,000 COLONIES/mL ESCHERICHIA COLI     Time coordinating discharge:  I have spent 35 minutes face to face with the patient and on the ward discussing the patients care, assessment, plan and disposition with other care givers. >50% of the time was devoted counseling the patient about the risks and benefits of treatment/Discharge disposition and coordinating care.   SIGNED:   Dimple Nanas, MD  [facility] 05/13/2020, 8:56 AM   If 7PM-7AM, please contact night-coverage

## 2020-05-13 NOTE — Progress Notes (Signed)
Patient transferred to [facility] 4W08, report given to receiving nurse Morrie Sheldon, RN.

## 2020-05-13 NOTE — Plan of Care (Signed)
  Problem: Health Behavior/Discharge Planning: Goal: Ability to manage health-related needs will improve Outcome: Not Progressing   

## 2020-05-13 NOTE — Progress Notes (Signed)
Mobility Specialist - Progress Note   05/13/20 1246  Mobility  Activity  (Cancel)   Pt's RN says she will be transferring to CIR today, will not see pt.   Mamie Levers Mobility Specialist Mobility Specialist Phone: [phone]

## 2020-05-14 ENCOUNTER — Inpatient Hospital Stay (HOSPITAL_COMMUNITY): Payer: Medicare Other

## 2020-05-14 ENCOUNTER — Inpatient Hospital Stay (HOSPITAL_COMMUNITY): Payer: Medicare Other | Admitting: Occupational Therapy

## 2020-05-14 ENCOUNTER — Inpatient Hospital Stay (HOSPITAL_COMMUNITY): Payer: Medicare Other | Admitting: Physical Therapy

## 2020-05-14 DIAGNOSIS — R5381 Other malaise: Principal | ICD-10-CM

## 2020-05-14 DIAGNOSIS — R404 Transient alteration of awareness: Secondary | ICD-10-CM

## 2020-05-14 LAB — CBC WITH DIFFERENTIAL/PLATELET
Abs Immature Granulocytes: 0.05 10*3/uL (ref 0.00–0.07)
Basophils Absolute: 0 10*3/uL (ref 0.0–0.1)
Basophils Relative: 0 %
Eosinophils Absolute: 0.2 10*3/uL (ref 0.0–0.5)
Eosinophils Relative: 1 %
HCT: 33.3 % — ABNORMAL LOW (ref 36.0–46.0)
Hemoglobin: 10.3 g/dL — ABNORMAL LOW (ref 12.0–15.0)
Immature Granulocytes: 1 %
Lymphocytes Relative: 14 %
Lymphs Abs: 1.5 10*3/uL (ref 0.7–4.0)
MCH: 30.3 pg (ref 26.0–34.0)
MCHC: 30.9 g/dL (ref 30.0–36.0)
MCV: 97.9 fL (ref 80.0–100.0)
Monocytes Absolute: 1 10*3/uL (ref 0.1–1.0)
Monocytes Relative: 9 %
Neutro Abs: 7.9 10*3/uL — ABNORMAL HIGH (ref 1.7–7.7)
Neutrophils Relative %: 75 %
Platelets: 185 10*3/uL (ref 150–400)
RBC: 3.4 MIL/uL — ABNORMAL LOW (ref 3.87–5.11)
RDW: 15.5 % (ref 11.5–15.5)
WBC: 10.6 10*3/uL — ABNORMAL HIGH (ref 4.0–10.5)
nRBC: 0.3 % — ABNORMAL HIGH (ref 0.0–0.2)

## 2020-05-14 LAB — COMPREHENSIVE METABOLIC PANEL
ALT: 42 U/L (ref 0–44)
AST: 53 U/L — ABNORMAL HIGH (ref 15–41)
Albumin: 2.4 g/dL — ABNORMAL LOW (ref 3.5–5.0)
Alkaline Phosphatase: 48 U/L (ref 38–126)
Anion gap: 11 (ref 5–15)
BUN: 11 mg/dL (ref 8–23)
CO2: 24 mmol/L (ref 22–32)
Calcium: 8.9 mg/dL (ref 8.9–10.3)
Chloride: 104 mmol/L (ref 98–111)
Creatinine, Ser: 0.73 mg/dL (ref 0.44–1.00)
GFR calc Af Amer: >60 mL/min (ref >60)
GFR calc non Af Amer: >60 mL/min (ref >60)
Glucose, Bld: 96 mg/dL (ref 70–99)
Potassium: 4.2 mmol/L (ref 3.5–5.1)
Sodium: 139 mmol/L (ref 135–145)
Total Bilirubin: 2.3 mg/dL — ABNORMAL HIGH (ref 0.3–1.2)
Total Protein: 6.1 g/dL — ABNORMAL LOW (ref 6.5–8.1)

## 2020-05-14 LAB — GLUCOSE, CAPILLARY
Glucose-Capillary: 90 mg/dL (ref 70–99)
Glucose-Capillary: 100 mg/dL — ABNORMAL HIGH (ref 70–99)

## 2020-05-14 LAB — LACTIC ACID, PLASMA
Lactic Acid, Venous: 1.7 mmol/L (ref 0.5–1.9)
Lactic Acid, Venous: 1.8 mmol/L (ref 0.5–1.9)

## 2020-05-14 MED ORDER — IOHEXOL 350 MG/ML SOLN
75.0000 mL | Freq: Once | INTRAVENOUS | Status: AC | PRN
  Administered 2020-05-14: 75 mL via INTRAVENOUS

## 2020-05-14 MED ORDER — NITROGLYCERIN 0.4 MG SL SUBL: 0.4000 mg | SUBLINGUAL_TABLET | SUBLINGUAL | Status: DC | PRN

## 2020-05-14 NOTE — Evaluation (Signed)
Physical Therapy Assessment and Plan  Patient Details  Name: [name] MRN: [id number] Date of Birth: [birth year]  Today's Date: 05/14/2020 PT Missed Time: 60 Minutes Missed Time Reason: MD hold (Comment)   Patient off unit for CT scan due to CODE Stroke being called. Therapist unable to perform evaluation at this time.   Patient returned to room; however, Dr. Carlis Abbott requested to hold remainder of therapies today. Missed 60 minutes of skilled physical therapy.  Ginny Forth , PT, DPT, CSRS  05/14/2020, 12:22 PM

## 2020-05-14 NOTE — Plan of Care (Signed)
  Problem: Consults Goal: RH GENERAL PATIENT EDUCATION Description: See Patient Education module for education specifics. Outcome: Progressing Goal: Skin Care Protocol Initiated - if Braden Score 18 or less Description: Pt will be knowledgeable regarding skin care protocol with verbal cueing Outcome: Progressing Goal: Nutrition Consult-if indicated Outcome: Progressing Goal: Diabetes Guidelines if Diabetic/Glucose > 140 Description: If diabetic or lab glucose is > 140 mg/dl - Initiate Diabetes/Hyperglycemia Guidelines & Document Interventions  Outcome: Progressing   Problem: RH BOWEL ELIMINATION Goal: RH STG MANAGE BOWEL WITH ASSISTANCE Description: STG Manage Bowel with  mod Assistance. Outcome: Progressing Goal: RH STG MANAGE BOWEL W/MEDICATION W/ASSISTANCE Description: STG Manage Bowel with Medication with  with mod Assistance. Outcome: Progressing   Problem: RH BLADDER ELIMINATION Goal: RH STG MANAGE BLADDER WITH ASSISTANCE Description: STG Manage Bladder With mod Assistance Outcome: Progressing Goal: RH STG MANAGE BLADDER WITH EQUIPMENT WITH ASSISTANCE Description: STG Manage Bladder With Equipment With Assistance Outcome: Progressing   Problem: RH SKIN INTEGRITY Goal: RH STG SKIN FREE OF INFECTION/BREAKDOWN Description: Pt will be knowledgeable voice understanding of protocol to prevent further breakdown and improve current condition with verbal cueing Outcome: Progressing Goal: RH STG MAINTAIN SKIN INTEGRITY WITH ASSISTANCE Description: STG Maintain Skin Integrity With mod Assistance. Outcome: Progressing Goal: RH STG ABLE TO PERFORM INCISION/WOUND CARE W/ASSISTANCE Description: STG Able To Perform Incision/Wound Care With  mod Assistance. Outcome: Progressing   Problem: RH SAFETY Goal: RH STG ADHERE TO SAFETY PRECAUTIONS W/ASSISTANCE/DEVICE Description: STG Adhere to Safety Precautions With  min Assistance/Devices per therapy and protocol Outcome: Progressing Goal:  RH STG DECREASED RISK OF FALL WITH ASSISTANCE Description: STG Decreased Risk of Fall With mod  Assistance. Outcome: Progressing   Problem: RH PAIN MANAGEMENT Goal: RH STG PAIN MANAGED AT OR BELOW PT'S PAIN GOAL Description: Pain +< 4/10  Outcome: Progressing   Problem: RH KNOWLEDGE DEFICIT GENERAL Goal: RH STG INCREASE KNOWLEDGE OF SELF CARE AFTER HOSPITALIZATION Description: Will voice understanding of post care will demonstrate proper use of equipment with verbal cueing Outcome: Progressing   

## 2020-05-14 NOTE — Evaluation (Signed)
Occupational Therapy Assessment and Plan  Patient Details  Name: [name] MRN: [id number] Date of Birth: 1941-04-11  OT Diagnosis: abnormal posture, acute pain, apraxia, cognitive deficits, disturbance of vision, hemiplegia affecting non-dominant side, muscle weakness (generalized) and swelling of limb Rehab Potential: Rehab Potential (ACUTE ONLY): Fair ELOS: 16-18 days   Today's Date: 05/14/2020 OT Individual Time: 5916-3846 OT Individual Time Calculation (min): 60 min     Hospital Problem: Principal Problem:   Debility   Past Medical History:  Past Medical History:  Diagnosis Date  . Arthritis   . Asthma   . Chronic back pain   . Depression   . History of pulmonary embolus (PE)   . Hx-TIA (transient ischemic attack)   . Hyperlipemia   . Hypertension   . Personal history of DVT (deep vein thrombosis)   . Scoliosis   . Sleep apnea    cpap - settingsi at 2   . 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 ARTHROSCOPIC REPAIR ACL  02/03/2004  . IR IVC FILTER PLMT / S&I /IMG GUID/MOD SED  05/10/2020  . LEFT HEART CATH AND CORONARY ANGIOGRAPHY N/A 09/16/2019   Procedure: LEFT HEART CATH AND CORONARY ANGIOGRAPHY;  Surgeon: Nelva Bush, MD;  Location: [facility];  Service: Cardiovascular;  Laterality: N/A;  . LOOP RECORDER INSERTION N/A 02/21/2020   Procedure: LOOP RECORDER INSERTION;  Surgeon: Deboraha Sprang, MD;  Location: [facility];  Service: Cardiovascular;  Laterality: N/A;  . OOPHORECTOMY  1970  . SPINE SURGERY  03/31/2006  . 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 TUBAL LIGATION  1983    Assessment & Plan Clinical Impression: [name] is a 79 y.o. female with a PMH of DVT/PE, Takatsubo  cardiomyopathy, HTN, OSA, who was admitted syncopal episode and was found to have a submassive PE with saddle emboli and right heart strain. She received tPA and developed left arm hematoma at the infusion site. She required transfusion for acute blood loss anemia. Anticoagulation was discontinued due to drop in hemoglobin. IVC filter was placed on 05/10/20.    Patient currently requires total with basic self-care skills secondary to muscle weakness and muscle paralysis, decreased cardiorespiratoy endurance, decreased coordination and decreased motor planning, decreased visual perceptual skills, decreased midline orientation and decreased attention to right, decreased initiation, decreased attention, decreased problem solving, decreased safety awareness and decreased memory and decreased sitting balance, decreased standing balance, decreased postural control, hemiplegia and decreased balance strategies.  Prior to hospitalization, patient could complete BADLs with min.  Patient will benefit from skilled intervention to increase independence with basic self-care skills prior to discharge home with family.  Anticipate patient will require 24 hour supervision and minimal physical assistance and follow up home health.  OT - End of Session Endurance Deficit: Yes Endurance Deficit Description: Pt reported feeling "wiped out" after a few minutes of EOB self care activity OT Assessment Rehab Potential (ACUTE ONLY): Fair OT Barriers to Discharge: Weight;Incontinence OT Patient demonstrates impairments in the following area(s): Balance;Motor;Skin Integrity;Cognition;Pain;Vision;Edema;Perception;Safety;Endurance OT Basic ADL's Functional Problem(s): Grooming;Bathing;Dressing;Toileting OT Advanced ADL's Functional Problem(s): Simple Meal Preparation OT Transfers Functional Problem(s): Toilet;Tub/Shower OT Additional Impairment(s): Fuctional Use of Upper Extremity OT Plan OT Intensity: Minimum of 1-2 x/day, 45 to  90 minutes  OT Frequency: 5 out of 7 days OT Duration/Estimated Length of Stay: 16-18 days OT Treatment/Interventions: Balance/vestibular training;Community reintegration;Disease mangement/prevention;Patient/family education;Self Care/advanced ADL retraining;Splinting/orthotics;Therapeutic Exercise;UE/LE Coordination activities;Cognitive remediation/compensation;Discharge planning;DME/adaptive equipment instruction;Functional mobility training;Pain management;Psychosocial support;Therapeutic Activities;UE/LE Strength taining/ROM;Visual/perceptual remediation/compensation;Wheelchair propulsion/positioning OT Self Feeding Anticipated Outcome(s): No goal OT Basic Self-Care Anticipated Outcome(s): Supervision-Min A OT Toileting Anticipated Outcome(s): Min A OT Bathroom Transfers Anticipated Outcome(s): Supervision OT Recommendation Recommendations for Other Services: Speech consult Patient destination: Home Follow Up Recommendations: Home health OT Equipment Recommended: To be determined   OT Evaluation Precautions/Restrictions  Restrictions Weight Bearing Restrictions: No General   Vital Signs   Pain Pain Assessment Pain Scale: 0-10 Pain Score: Asleep Pain Type: Acute pain Pain Location: Arm Pain Orientation: Left Pain Descriptors / Indicators: Aching Pain Onset: Gradual Pain Intervention(s): Medication (See eMAR) Home Living/Prior [place] expects to be discharged to:: Private residence Living Arrangements: Other relatives Available Help at Discharge: Family, Available 24 hours/day Type of Home: House Home Access: Level entry Home Layout: One level Bathroom Shower/Tub: Door, Gaffer (+seat) Bathroom Toilet: Associate Professor Accessibility: Yes Additional Comments: Already has shower seat + BSC  Lives With: Family IADL History Homemaking Responsibilities:  (per pt, she participated in some meal prep with family present PTA) Type of  Occupation: Pt responded "I don't remember" Leisure and Hobbies: Pt stating "I don't know" Prior Function Level of Independence: Needs assistance with ADLs, Requires assistive device for independence Vision Baseline Vision/History: Wears glasses Wears Glasses: Reading only Vision Assessment?: Vision impaired- to be further tested in functional context (Lt gaze preference, able to turn head towards Rt, noted difficultly with horizontal/vertical tracking with cuing) Perception  Perception: Impaired Inattention/Neglect: Does not attend to right visual field Praxis Praxis: Impaired Praxis Impairment Details: Initiation;Motor planning Cognition Overall Cognitive Status: No family/caregiver present to determine baseline cognitive functioning Arousal/Alertness: Lethargic Orientation Level: Person;Place;Situation Person: Oriented Situation: Oriented Year: 2021 Month: October Day of Week: Incorrect Memory: Impaired Memory Impairment: Decreased long term memory;Decreased short term memory Immediate Memory Recall: Sock;Blue;Bed Memory Recall Sock: With Cue Memory Recall Blue: Without Cue Memory Recall Bed: Without Cue Attention: Sustained Sustained Attention: Impaired Sensation Sensation Light Touch: Impaired Detail Peripheral sensation comments:  (pt reports tingling sensations in Lt UE, light touch intact via gross assessment) Light Touch Impaired Details: Impaired LUE Coordination Gross Motor Movements are Fluid and Coordinated: No Fine Motor Movements are Fluid and Coordinated: No Coordination and Movement Description: Guarded due to pain, also affected by edema and nonfunctional use of the Lt hand Finger Nose Finger Test: Pt initially tapping her Lt finger and then her nose vs OT's finger presented at midline, with max cuing able to complete with mild dysmetria on the Rt, unable on the Lt Motor  Motor Motor: Hemiplegia;Abnormal postural alignment and control;Motor  impersistence Motor - Skilled Clinical Observations: Lt UE weakness compared to Rt UE  Trunk/Postural Assessment  Cervical Assessment Cervical Assessment: Exceptions to [facility] (flexed forward and laterally Lt) Thoracic Assessment Thoracic Assessment: Exceptions to [facility] (thoracic rounding, protrusion of "hardware" from surgery hx) Lumbar Assessment Lumbar Assessment: Exceptions to [facility] (posterior pelvic tilt) Postural Control Postural Control: Deficits on evaluation (Multiple LOBs while seated EOB, posterioraly, anteriorally, and laterally Rt)  Balance Balance Balance Assessed: Yes Dynamic Sitting Balance Dynamic Sitting - Level of Assistance: 3: Mod assist (pt fluctuating between close supervision-Mod A for sitting balance EOB) Dynamic Standing Balance Dynamic Standing - Level of Assistance: 1: +2 Total assist (using Stedy for toilet transfer) Extremity/Trunk Assessment RUE Assessment RUE  Assessment: Within Functional Limits LUE Assessment LUE Assessment: Exceptions to [facility] (edematous, ecchymosis) Active Range of Motion (AROM) Comments: Pt able to tolerate minimal active assist ROM of the shoulder and elbow, able to squeeze OT's hand but unable to extend digits  Care Tool Care Tool Self Care Eating   Eating Assist Level: Supervision/Verbal cueing    Oral Care  Oral care, brush teeth, clean dentures activity did not occur:  (not assessed due to time constraints)      Bathing     Body parts bathed by helper: Right arm;Left arm;Chest;Abdomen;Front perineal area;Buttocks;Right upper leg;Left upper leg;Left lower leg;Face;Right lower leg   Assist Level: Total Assistance - Patient < 25% (using Stedy lift for sit<stand)    Upper Body Dressing(including orthotics)   What is the patient wearing?: Pull over shirt   Assist Level: Maximal Assistance - Patient 25 - 49%    Lower Body Dressing (excluding footwear)   What is the patient wearing?: Pants;Incontinence brief Assist for lower body  dressing: Total Assistance - Patient < 25%    Putting on/Taking off footwear   What is the patient wearing?: Non-skid slipper socks Assist for footwear: Total Assistance - Patient < 25%       Care Tool Toileting Toileting activity   Assist for toileting: Dependent - Patient 0% (using Stedy)     Care Tool Bed Mobility Roll left and right activity        Sit to lying activity        Lying to sitting edge of bed activity         Care Tool Transfers Sit to stand transfer        Chair/bed transfer         Toilet transfer   Assist Level: Dependent - Patient 0% (Stedy transfer, 1 assist)     Care Tool Cognition Expression of Ideas and Wants     Understanding Verbal and Non-Verbal Content     Memory/Recall Ability *first 3 days only      Refer to Care Plan for Long Term Goals  SHORT TERM GOAL WEEK 1 OT Short Term Goal 1 (Week 1): Pt will complete UB bathing with Mod A OT Short Term Goal 2 (Week 1): Pt will complete 1 grooming task while sitting at the sink with mod cuing for locating items in Rt visual field OT Short Term Goal 3 (Week 1): Pt will thread 1 LE into pants with Mod A  Recommendations for other services: None    Skilled Therapeutic Intervention Skilled OT session completed with focus on initial evaluation, education on OT role/POC, and establishment of patient-centered goals.   Pt greeted in bed, agreeable to session. Supine<sit with Max A to engage in bathing/dressing tasks. Note that pt had posterior, anterior, and lateral Rt LOBs, exhibited very forward flexed posture with manual cues for promoting neutral upright. She required Supervision-Mod A for sitting balance, able to correct balance and forward gaze given frequent cuing. Pt initially unable to scan to Rt to look at therapist due to Lt gaze preference. She washed her face and chest and then reported she couldn't do any more due to feeling "wiped out." OT provided Total A for UB bathing for  thoroughness. Total A for LB bathing as well. Sit<stand with Mod Ax2 using Stedy lift from elevated bed. Pt completed BSC transfer, agreeable to try to void bladder. She reported she was finished voiding but nothing was left in BSC pan. Pt reported she needed to return  to bed due to "pain all over" and therefore dressing tasks were completed bedlevel via rolling Rt>Lt. Stedy +1 transfer completed back to bed, Mod A for sit<stand. Note that pt can minimally move her Lt arm against gravity with active assist, has grip but minimal voluntary release. A lot of pain with small movements, pt protecting limb during bed mobility. She initiated bridging to pull up pants, needing assistance to stabilize Lt arm closer to body for pain mgt. Pt ultimately needing to roll Rt>Lt to complete this aspect of ADL task, Total A for UB dressing using rolls as well. Pt remained in bed at end of session, left with all needs within reach and bed alarm set, Lt UE elevated for edema mgt.   ADL Eating: Not assessed Grooming: Maximal assistance Where Assessed-Grooming: Bed level Upper Body Bathing: Dependent Where Assessed-Upper Body Bathing: Edge of bed Lower Body Bathing: Dependent Where Assessed-Lower Body Bathing: Edge of bed;Other (Comment) (sit<stand using Stedy lift) Upper Body Dressing: Maximal assistance Where Assessed-Upper Body Dressing: Bed level Lower Body Dressing: Dependent Where Assessed-Lower Body Dressing: Bed level Toileting: Dependent Where Assessed-Toileting: Bedside Commode Toilet Transfer: Dependent (using Stedy lift) Toilet Transfer Method: Other (comment) Charlaine Dalton) Toilet Transfer Equipment: Bedside commode;Raised toilet seat Tub/Shower Transfer: Not assessed Mobility   Dependent toilet transfer with Stedy +1 and +2 assist   Discharge Criteria: Patient will be discharged from OT if patient refuses treatment 3 consecutive times without medical reason, if treatment goals not met, if there is a change  in medical status, if patient makes no progress towards goals or if patient is discharged from hospital.  The above assessment, treatment plan, treatment alternatives and goals were discussed and mutually agreed upon: by patient  Skeet Simmer 05/14/2020, 12:46 PM

## 2020-05-14 NOTE — Progress Notes (Signed)
Inpatient Rehabilitation Medication Review by a Pharmacist  A complete drug regimen review was completed for this patient to identify any potential clinically significant medication issues.  Clinically significant medication issues were identified:  yes   Type of Medication Issue Identified Description of Issue Urgent (address now) Non-Urgent (address on AM team rounds) Plan Plan Accepted by Provider? (Yes / No / Pending AM Rounds)  Drug Interaction(s) (clinically significant)       Duplicate Therapy       Allergy       No Medication Administration End Date       Incorrect Dose       Additional Drug Therapy Needed  Patient was discharged on aspirin 81 mg but this has not been ordered for CIR Non-urgent Messaged MD via secure chat Holding off on restarting aspirin due to hematoma  Other  Nitrostat SL PRN was ordered at discharge but has not been ordered for CIR Non-urgent Messaged MD via secure chat Yes - restarted   Name of provider notified for issues identified: Dr. Carlis Abbott  Provider Method of Notification: Secure Chat  Pharmacist comments: Medication issues resolved as detailed above.   Time spent performing this drug regimen review (minutes):  10  Pervis Hocking, PharmD PGY1 Pharmacy Resident 05/14/2020 7:52 AM  Please check [email or web address] for unit-specific pharmacy phone numbers.

## 2020-05-14 NOTE — Progress Notes (Signed)
PMR Admission Coordinator Pre-Admission Assessment   Patient: [name] is an 79 y.o., female MRN: [id number] DOB: [birth year] Height: '5\' 2"'  (157.5 cm) Weight: 91 kg   Insurance Information HMO:     PPO:      PCP:      IPA:      80/20:      OTHER:  PRIMARY: Medicare A and B      Policy#: [id number]            Subscriber: pt  CM Name:       Phone#:      Fax#:  Pre-Cert#: verified online       Employer:  Benefits:  Phone #:      Name:  Eff. Date: A 06/13/91, B 10/11/94     Deduct: $1484      Out of Pocket Max:       Life Max:  CIR: 100%      SNF: 20 full days Outpatient: 80%     Co-Pay: 20% Home Health: 100%      Co-Pay:  DME: 80%     Co-Pay: 20% Providers: n/a SECONDARY: [employer]      Policy#: [id number]           Phone#: [phone]   Financial Counselor:       Phone#:    The "Data Collection Information Summary" for patients in Inpatient Rehabilitation Facilities with attached "Privacy Act Dewey Records" was provided and verbally reviewed with: Patient and Family   Emergency Contact Information         Contact Information     Name Relation Home Work [place] Son [phone]        Cooper,Carolyn Granddaughter [place] Other [phone]             Current Medical History  Patient Admitting Diagnosis: Saddle PE   History of Present Illness: Pt is a 79 y/o female with history of CIR stay in 02/2020 for CVA (d/c 03/14/2020), Takotsubo cardiomyopathy, DVT/PE, HTN, OSA, admitted to [facility] on 05/05/20 for syncopal episode.  Pt found to have submassive PE with saddle emboli and R heart strain.  Cardiology consulted for elevated troponin and they did not suspect ACS.  PCCM consulted and recommended TPA and transfer to ICU.  TPA complicated by left arm hematoma in the infusion site, requiring ABLA and transfusion.  Due to persistent drop in hemoglobin anticoagulation discontinued.  CT abd/pelvis  negative for retroperitoneal bleed, but did show R hip cellulitis.  Pt had an IVC filter placed on 05/10/2020.  Therapy evaluations completed and pt was recommended for CIR.    Patient's medical record from [facility] has been reviewed by the rehabilitation admission coordinator and physician.   Past Medical History      Past Medical History:  Diagnosis Date  . Arthritis    . Asthma    . Chronic back pain    . Depression    . History of pulmonary embolus (PE)    . Hx-TIA (transient ischemic attack)    . Hyperlipemia    . Hypertension    . Personal history of DVT (deep vein thrombosis)    . Scoliosis    . Sleep apnea      cpap - settingsi at 3   . 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      Family History   family history includes Other in her father.  Prior Rehab/Hospitalizations Has the patient had prior rehab or hospitalizations prior to admission? Yes   Has the patient had major surgery during 100 days prior to admission? Yes              Current Medications   Current Facility-Administered Medications:  .  acetaminophen (TYLENOL) tablet 650 mg, 650 mg, Oral, Q6H PRN, Opyd, Ilene Qua, MD, 650 mg at 05/07/20 1431 .  artificial tears (LACRILUBE) ophthalmic ointment, , Left Eye, QHS, Amin, Jeanella Flattery, MD, Given at 05/12/20 2223 .  busPIRone (BUSPAR) tablet 30 mg, 30 mg, Oral, BID, Gleason, Otilio Carpen, PA-C, 30 mg at 05/12/20 2215 .  cephALEXin (KEFLEX) capsule 500 mg, 500 mg, Oral, Q8H, Amin, Ankit Chirag, MD, 500 mg at 05/13/20 0444 .  Chlorhexidine Gluconate Cloth 2 % PADS 6 each, 6 each, Topical, Daily, Irene Pap N, DO, 6 each at 05/12/20 1030 .  cycloSPORINE (RESTASIS) 0.05 % ophthalmic emulsion 1 drop, 1 drop, Both Eyes, Daily PRN, Amin, Ankit Chirag, MD .  donepezil (ARICEPT) tablet 10 mg, 10 mg, Oral, QHS, Gleason, Otilio Carpen, PA-C, 10 mg at 05/12/20 2215 .  insulin aspart (novoLOG) injection 0-9 Units, 0-9 Units, Subcutaneous, Q4H, Gleason, Otilio Carpen, PA-C, 1 Units  at 05/11/20 2100 .  linaclotide (LINZESS) capsule 145 mcg, 145 mcg, Oral, Daily, Amin, Ankit Chirag, MD, 145 mcg at 05/13/20 0828 .  memantine (NAMENDA XR) 24 hr capsule 28 mg, 28 mg, Oral, Daily, Gleason, Otilio Carpen, PA-C, 28 mg at 05/12/20 1029 .  ondansetron (ZOFRAN) injection 4 mg, 4 mg, Intravenous, Q6H PRN, Opyd, Ilene Qua, MD, 4 mg at 05/07/20 0039 .  oxyCODONE (Oxy IR/ROXICODONE) immediate release tablet 15 mg, 15 mg, Oral, QID PRN, Amin, Ankit Chirag, MD, 15 mg at 05/13/20 0444 .  polyethylene glycol (MIRALAX / GLYCOLAX) packet 17 g, 17 g, Oral, Daily PRN, Gleason, Otilio Carpen, PA-C .  polyvinyl alcohol (LIQUIFILM TEARS) 1.4 % ophthalmic solution 2 drop, 2 drop, Left Eye, QID, Amin, Ankit Chirag, MD, 2 drop at 05/12/20 2222 .  senna-docusate (Senokot-S) tablet 1 tablet, 1 tablet, Oral, QHS, Amin, Ankit Chirag, MD, 1 tablet at 05/12/20 2215 .  vancomycin (VANCOREADY) IVPB 1250 mg/250 mL, 1,250 mg, Intravenous, Q24H, Rozann Lesches, RPH, Last Rate: 166.7 mL/hr at 05/12/20 1800, Rate Verify at 05/12/20 1800   Patients Current Diet:     Diet Order                      Diet heart healthy/carb modified Room service appropriate? Yes with Assist; Fluid consistency: Thin  Diet effective now                      Precautions / Restrictions Precautions Precautions: Fall Restrictions Weight Bearing Restrictions: No    Has the patient had 2 or more falls or a fall with injury in the past year? No   Prior Activity Level Household: Not going out much since return home from CIR on 8/3. Family provides assist for ADLs and mobility   Prior Functional Level Self Care: Did the patient need help bathing, dressing, using the toilet or eating? Needed some help   Indoor Mobility: Did the patient need assistance with walking from room to room (with or without device)? Needed some help   Stairs: Did the patient need assistance with internal or external stairs (with or without device)? Needed some help    Functional Cognition: Did the patient need help planning regular tasks such  as shopping or remembering to take medications? Needed some help   Home Assistive Devices / Equipment Home Equipment: Walker - 2 wheels, Wheelchair - manual, Bedside commode, Shower seat   Prior Device Use: Indicate devices/aids used by the patient prior to current illness, exacerbation or injury? Walker   Current Functional Level Cognition   Overall Cognitive Status: No family/caregiver present to determine baseline cognitive functioning Orientation Level: Oriented X4 Safety/Judgement: Decreased awareness of safety General Comments: requiring cues for sequence, reassurance, and initiation of transfers    Extremity Assessment (includes Sensation/Coordination)   Upper Extremity Assessment: Generalized weakness, LUE deficits/detail RUE Deficits / Details: grossly 4-/5 LUE Deficits / Details: edematous, 3-/5 shoulder flexion, elbow flexion limited by swelling, grip 3/5 LUE: Unable to fully assess due to pain LUE Sensation: WNL LUE Coordination: decreased gross motor  Lower Extremity Assessment: Defer to PT evaluation     ADLs   Overall ADL's : Needs assistance/impaired Eating/Feeding: Set up, Sitting Grooming: Wash/dry hands, Wash/dry face, Minimal assistance, Bed level Upper Body Bathing: Maximal assistance, Bed level Lower Body Bathing: Total assistance, Bed level Upper Body Dressing : Maximal assistance, Bed level Lower Body Dressing: Total assistance, Bed level Toileting- Clothing Manipulation and Hygiene: Total assistance, Bed level Functional mobility during ADLs: Total assistance General ADL Comments: maximove used for back to bed.     Mobility   Overal bed mobility: Needs Assistance Bed Mobility: Supine to Sit Supine to sit: Mod assist General bed mobility comments: +2 to reposition in bed after placement via lift.     Transfers   Overall transfer level: Needs assistance Equipment used:  Ambulation equipment used Transfer via Lift Equipment: Marketing executive Transfers: Sit to/from Stand Sit to Stand: Mod assist, +2 physical assistance Stand pivot transfers: Total assist General transfer comment: Sit to stand x 1 from bed and x 1 from STEDY.  Required cues for feet placement and leaning forward.  Mod A x 2 to stand and support L UE.  Cues for posture with assist at buttock and shoulders to facilitate standing tall.  STEDY for pivot     Ambulation / Gait / Stairs / Proofreader / Balance Dynamic Sitting Balance Sitting balance - Comments: Tending to lean to R; required cues and UE assist to correct; min A at times; at EOB for 8 mins Balance Overall balance assessment: Needs assistance Sitting-balance support: Single extremity supported, Feet supported Sitting balance-Leahy Scale: Poor Sitting balance - Comments: Tending to lean to R; required cues and UE assist to correct; min A at times; at EOB for 8 mins Postural control: Right lateral lean Standing balance support: Bilateral upper extremity supported Standing balance-Leahy Scale: Poor Standing balance comment: Required min A static standing and weight shifting in STEDY.  Stood twice for ~30 seconds.  Assisted with weight shifting.     Special needs/care consideration Diabetic management yes and Designated visitor [place] (from acute therapy documentation) Living Arrangements: Other relatives Available Help at Discharge: Family, Available 24 hours/day Type of Home: House Home Layout: One level Home Access: Level entry Bathroom Shower/Tub: Door, Multimedia programmer: Standard Bathroom Accessibility: Yes How Accessible: Accessible via walker Additional Comments: granddaughter lives with patient   Discharge Living Setting Plans for Discharge Living Setting: Lives with (comment) (son/daughter in law/grand daughter) Type of Home at Discharge: House Discharge  Home Layout: One level Discharge Home Access: Level entry Discharge Bathroom Shower/Tub: Walk-in shower Discharge [place]  Toilet: Standard Discharge Bathroom Accessibility: Yes How Accessible: Accessible via walker Does the patient have any problems obtaining your medications?: No   Social/Family/Support Systems Anticipated Caregiver: son, Sharen Hones Anticipated Caregiver's Contact Information: [phone] Caregiver Availability: 24/7 Discharge Plan Discussed with Primary Caregiver: Yes Is Caregiver In Agreement with Plan?: Yes Does Caregiver/Family have Issues with Lodging/Transportation while Pt is in Rehab?: No   Goals Patient/Family Goal for Rehab: PT/OT supervision to min assist, SLP supervision to mod I Expected length of stay: 16-20 days Additional Information: recent admit, [facility]'s team Pt/Family Agrees to Admission and willing to participate: Yes Program Orientation Provided & Reviewed with Pt/Caregiver Including Roles  & Responsibilities: Yes   Decrease burden of Care through IP rehab admission: n/a   Possible need for SNF placement upon discharge: Not anticipated   Patient Condition: I have reviewed medical records from [facility], spoken with CM, and patient and son. I met with patient at the bedside for inpatient rehabilitation assessment.  Patient will benefit from ongoing PT and OT, can actively participate in 3 hours of therapy a day 5 days of the week, and can make measurable gains during the admission.  Patient will also benefit from the coordinated team approach during an Inpatient Acute Rehabilitation admission.  The patient will receive intensive therapy as well as Rehabilitation physician, nursing, social worker, and care management interventions.  Due to safety, skin/wound care, disease management, medication administration, pain management and patient education the patient requires 24 hour a day rehabilitation nursing.  The patient is currently Mod  +2-Max A with mobility and basic ADLs.  Discharge setting and therapy post discharge at home with home health is anticipated.  Patient has agreed to participate in the Acute Inpatient Rehabilitation Program and will admit today.   Preadmission Screen Completed By: Shann Medal, PT, DPT, with updates by  Clemens Catholic, MS CCC-SLP, 05/13/2020 8:54 AM ______________________________________________________________________   Discussed status with Dr. Ranell Patrick  on 05/13/20  at 8:56 AM  and received approval for admission today.   Admission Coordinator: Shann Medal, PT, DPT;  Genella Mech, CCC-SLP, time 8:56 AM Sudie Grumbling 05/13/20  With updates 05/13/20 by Clemens Catholic, MS, CCC-SLP    Assessment/Plan: Diagnosis: Debility 1. Does the need for close, 24 hr/day Medical supervision in concert with the patient's rehab needs make it unreasonable for this patient to be served in a less intensive setting? Yes 2. Co-Morbidities requiring supervision/potential complications: Takotsubo cardiomyopathy, submassive PE with saddle emboli and right heart strain, HTN, OSA, syncope 3. Due to bladder management, bowel management, safety, skin/wound care, disease management, medication administration, pain management and patient education, does the patient require 24 hr/day rehab nursing? Yes 4. Does the patient require coordinated care of a physician, rehab nurse, PT, OT, and SLP to address physical and functional deficits in the context of the above medical diagnosis(es)? Yes Addressing deficits in the following areas: balance, endurance, locomotion, strength, transferring, bowel/bladder control, bathing, dressing, feeding, grooming, toileting, cognition and psychosocial support 5. Can the patient actively participate in an intensive therapy program of at least 3 hrs of therapy 5 days a week? Yes 6. The potential for patient to make measurable gains while on inpatient rehab is excellent 7. Anticipated functional outcomes upon  discharge from inpatient rehab: min assist PT, min assist OT, modified independent SLP 8. Estimated rehab length of stay to reach the above functional goals is: 14-18 days 9. Anticipated discharge destination: Home 10. Overall Rehab/Functional Prognosis: excellent     MD Signature: Martha Clan  Ranell Patrick, MD

## 2020-05-14 NOTE — Consult Note (Addendum)
NEUROHOSPITALISTS-CONSULTATION NOTE    Requesting Physician: Dr. Allena Katz  Admit date: 05/14/20 Chief Complaint: Code Stroke  History obtained from:  Patient and Chart      HPI   [name] is a 79 year old female w/pmh of a pontine stroke in 02/2020, Takotsubo cardiomyopathy, DVT/PE, HTN, OSA who presents with a near syncopal event while getting in the shower. Neurology was consulted for a code stroke due to acute onset of altered mental status.   Per the patients primary RN, this morning the patient was AAOx4 and following commands. She worked with therapy at 10AM and was noted to be lethargic but successfully finished her session with them. At 12PM when the patients CNA came to bedside to assist her with eating the patient was noted to drop the food she was given. In addition, her speech was noted to be slurred and it was difficult for her to respond to questions. Due to this a code stroke was called.   Of note, on 9/24 via CT Chest PE she was noted to have submassive bilateral PE with saddle emboli and evidence of right heart strain for which she was treated with IVTPA then placed on a heparin gtt. Due to left upper extremity hematoma her heparin gtt was discontinued and she had an IVC filter placed on 05/10/20.  LKW: 1012AM     Past Medical History    Past Medical History:  Diagnosis Date  . Arthritis   . Asthma   . Chronic back pain   . Depression   . History of pulmonary embolus (PE)   . Hx-TIA (transient ischemic attack)   . Hyperlipemia   . Hypertension   . Personal history of DVT (deep vein thrombosis)   . Scoliosis   . Sleep apnea    cpap - settingsi at 3   . 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     Past Surgical History   Past Surgical History:  Procedure Laterality Date  . ARTHROSCOPIC REPAIR ACL  02/03/2004  . IR IVC FILTER PLMT / S&I /IMG GUID/MOD SED  05/10/2020  . LEFT HEART CATH AND CORONARY ANGIOGRAPHY N/A 09/16/2019   Procedure: LEFT HEART  CATH AND CORONARY ANGIOGRAPHY;  Surgeon: Yvonne Kendall, MD;  Location: [facility] INVASIVE CV LAB;  Service: Cardiovascular;  Laterality: N/A;  . LOOP RECORDER INSERTION N/A 02/21/2020   Procedure: LOOP RECORDER INSERTION;  Surgeon: Duke Salvia, MD;  Location: [facility] INVASIVE CV LAB;  Service: Cardiovascular;  Laterality: N/A;  . OOPHORECTOMY  1970  . SPINE SURGERY  03/31/2006  . TOTAL KNEE ARTHROPLASTY Left 05/17/2016   Procedure: LEFT TOTAL KNEE ARTHROPLASTY;  Surgeon: Kathryne Hitch, MD;  Location: [facility] ORS;  Service: Orthopedics;  Laterality: Left;  Adductor Block; Spinal to General  . TUBAL LIGATION  1983     Family History   Family History  Problem Relation Age of Onset  . Other Father        kidney problems    Social History  Social History:  reports that she quit smoking about 46 years ago. Her smoking use included cigarettes. She has a 10.00 pack-year smoking history. She has never used smokeless tobacco. She reports that she does not drink alcohol and does not use drugs.   Allergies  Allergies:  Allergies  Allergen Reactions  . T-Pa [Alteplase] Swelling    Does okay with it if premedicated with benadryl.  . Codeine Itching     Medications Prior to Admission    Current Outpatient Medications  Medication Instructions  . acetaminophen (TYLENOL) 650 mg, Oral, Every 4 hours PRN  . amLODipine (NORVASC) 10 mg, Oral, Daily  . artificial tears (LACRILUBE) OINT ophthalmic ointment Left Eye, Daily at bedtime, Apply left eye bedtime  . aspirin 81 mg, Oral, Daily  . atorvastatin (LIPITOR) 20 mg, Oral, Daily at bedtime  . busPIRone (BUSPAR) 30 mg, Oral, 2 times daily  . cephALEXin (KEFLEX) 500 mg, Oral, Every 8 hours  . cholecalciferol (VITAMIN D3) 1,000 Units, Oral, Daily  . cycloSPORINE (RESTASIS) 0.05 % ophthalmic emulsion 1 drop, Both Eyes, Daily PRN  . diphenhydramine-acetaminophen (TYLENOL PM) 25-500 MG TABS tablet 1 tablet, Oral, At bedtime PRN  . donepezil (ARICEPT) 10  mg, Oral, Daily at bedtime  . Linzess 145 mcg, Oral, Daily  . lisinopril (ZESTRIL) 40 mg, Oral, Daily  . melatonin 3 mg, Oral, Daily at bedtime  . memantine (NAMENDA XR) 28 mg, Oral, Daily  . Multiple Vitamins-Minerals (MULTI-VITAMIN GUMMIES PO) 1 each, Oral, Daily  . nitroGLYCERIN (NITROSTAT) 0.4 mg, Sublingual, Every 5 min PRN  . oxyCODONE (ROXICODONE) 15 mg, Oral, 4 times daily PRN  . polyethylene glycol (MIRALAX / GLYCOLAX) 17 g, Oral, 2 times daily  . polyvinyl alcohol (LIQUIFILM TEARS) 1.4 % ophthalmic solution 2 drops, Left Eye, 4 times daily  . senna-docusate (SENOKOT-S) 8.6-50 MG tablet 1 tablet, Oral, Daily at bedtime  . vancomycin (VANCOREADY) 1,250 mg, Intravenous, Every 24 hours     ROS  History obtained from the patient General ROS: negative for - chills, fatigue, fever, night sweats, weight gain or weight loss Psychological ROS: negative for - behavioral disorder, hallucinations, memory difficulties, mood swings or suicidal ideation Ophthalmic ROS: negative for - blurry vision, double vision, eye pain or loss of vision ENT ROS: negative for - epistaxis, nasal discharge, oral lesions, sore throat, tinnitus or vertigo Allergy and Immunology ROS: negative for - hives or itchy/watery eyes Hematological and Lymphatic ROS: negative for - bleeding problems, bruising or swollen lymph nodes Endocrine ROS: negative for - galactorrhea, hair pattern changes, polydipsia/polyuria or temperature intolerance Respiratory ROS: negative for - cough, hemoptysis, shortness of breath or wheezing Cardiovascular ROS: negative for - chest pain, dyspnea on exertion, edema or irregular heartbeat Gastrointestinal ROS: negative for - abdominal pain, diarrhea, hematemesis, nausea/vomiting or stool incontinence Genito-Urinary ROS: negative for - dysuria, hematuria, incontinence or urinary frequency/urgency Musculoskeletal ROS: negative for - joint swelling or muscular weakness Neurological ROS: as noted  in HPI Dermatological ROS: negative for rash and skin lesion changes   Physical Examination          Vitals:    11/02/17 1445 11/02/17 1515 11/02/17 1545 11/02/17 1600  BP: 103/68 (!) 93/58 118/76 (!) 120/101  Pulse: (!) 58 (!) 56 (!) 59 (!) 59  Resp: 17 15 16 14   Temp:          TempSrc:          SpO2: 98% 99% 100% 100%  Weight:          Height:            HEENT-  Normocephalic,  Cardiovascular - Regular rate and rhythm  Respiratory -  Non-labored breathing, No wheezing. Abdomen - soft and non-tender, BS normal Extremities- no edema or cyanosis Skin-warm and dry   Neurological Examination  Blood pressure (!) 111/59, pulse 62, temperature 98.3 F (36.8 C), temperature source Oral, resp. rate 16, height 5\' 2"  (1.575 m), weight 93 kg, SpO2 99 %.  Neurological Examination Mental Status: Alert, oriented to  person place time year and situation. Follows commands ( shows two fingers, gives thumbs up)  Speech: Fluent with naming intact.  Cranial Nerves: PERRL, VFF, bilateral horizontal gaze palsy with limited up and down gaze , lower and upper motor neuron left side facial weakness (weak left eyebrow + left eyelid + left facial droop)  Motor: RUE 5/5 LUE 3/5 RLE 5/5 LLE 5/5  Tone and bulk: Normal tone throughout; no atrophy noted Sensory: Intact to light touch to BUE + BLE  Cerebellar: FNF + HTS intact  Gait: Deferred  NIHSS: 8 ( 3 gaze palsy, 3 facial weakness and 2 left arm drift)     Laboratory Results   Recent Labs  Lab 05/09/20 1832 05/09/20 1832 05/10/20 0425 05/10/20 0425 05/11/20 0243 05/12/20 0311 05/13/20 0211  NA 137  --  139  --  139 141 136  K 4.0  --  3.8  --  3.6 3.5 3.7  CL 104  --  105  --  106 106 102  CO2 24  --  24  --  25 25 24   GLUCOSE 153*  --  110*  --  99 87 107*  BUN 12  --  11  --  10 11 12   CREATININE 0.82  --  0.72  --  0.70 0.72 0.77  CALCIUM 8.4*   < > 8.4*   < > 8.2* 8.5* 8.6*  MG  --   --   --   --   --  2.0 2.0   < > = values in  this interval not displayed.    No results for input(s): CHOL, TRIG, HDL, CHOLHDL, VLDL, LDLCALC in the last 168 hours.   Recent Labs  Lab 05/13/20 1156 05/13/20 1648 05/13/20 2119 05/14/20 0619 05/14/20 1242  GLUCAP 96 114* 116* 90 100*     Imaging Results   Imaging:  05/14/20 CT Head  1. No evidence of acute intracranial abnormality. ASPECTS is 10 2. 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 3. Similar chronic microvascular ischemic disease.  05/14/20 CT Angio Head and Neck  1. No evidence of large vessel occlusion or hemodynamically significant stenosis. 2. Large bilateral acute pulmonary embolus, better characterized on recent CTA chest     IMPRESSION AND PLAN  [name] is a 79 year old female w/pmh of pontine stroke in 02/2020, Takotsubo cardiomyopathy, DVT/PE, HTN, OSA who presents as a code stroke w/acute onset altered mental status. Her LKW was 1012 AM, code stroke called at 1248PM.   Upon assessment of patient at the bedside she was lethargic, speech dysarthric and unable to answer LOC questions. By the time she was in the CT scanner her exam improved and she was AAOx4 with clear speech. Her exam is pertinent for left upper extremity weakness, limited horizontal eye movements and left upper/lower motor neuron facial weakness- deficits she has had since her L>R pontine stroke.   IVTPA and mechanical thrombectomy were not considered given lack of LVO on CT Angio Head and Neck.   It is unclear at this time what caused her acute altered mental status. Her BG taken during the code stroke was 100, lactic acid WNL at 1.7, CMP did not reveal any acute electrolyte abnormalities and CBC appears stable compared to prior. Given her pulmonary embolism recently treated with IVC it is reassuring that there is no LVO on the CTA Head and Neck. Her CTH showed similar appearance of remote pontine and left cerebellar stroke and no new  stroke.   No further  neurological work up is recommended at this time. Please refer to Amion to reach the on call neuro hospitalist with any questions. Neurology will sign off at this time.   Stark Jock, NP  [facility] Nurse Practitioner Patient seen and discussed with attending physician Dr. Thomasena Edis   Patient seen, examined discussed plan with Stark Jock PA. Agree with assessment and plan as documented above. I have independently reviewed the chart, obtained history, review of systems and examined the patient.  Dr. Marisue Humble [phone]

## 2020-05-14 NOTE — Progress Notes (Addendum)
[place] PHYSICAL MEDICINE & REHABILITATION PROGRESS NOTE   Subjective/Complaints: Sleeping soundly- was very awake yesterday and this morning. Opened eyes but was unable to follow commands like yesterday or answer orientation questions. Code stroke called, stat labs ordered, and stat CT head ordered and was negative and within two hours she had returned to baseline.  ROS: Limited given somnolence Objective:   No results found. Recent Labs    05/12/20 0311 05/13/20 0211  WBC 11.0* 11.0*  HGB 9.9* 10.0*  HCT 31.8* 32.0*  PLT 152 176   Recent Labs    05/12/20 0311 05/13/20 0211  NA 141 136  K 3.5 3.7  CL 106 102  CO2 25 24  GLUCOSE 87 107*  BUN 11 12  CREATININE 0.72 0.77  CALCIUM 8.5* 8.6*    Intake/Output Summary (Last 24 hours) at 05/14/2020 1124 Last data filed at 05/14/2020 0900 Gross per 24 hour  Intake 150 ml  Output --  Net 150 ml        Physical Exam: Vital Signs Blood pressure 130/61, pulse 65, temperature 99.1 F (37.3 C), temperature source Oral, resp. rate 18, height 5\' 2"  (1.575 m), weight 93 kg, SpO2 93 %. General: Facial droop, not following commands HEENT: Facial droop, somnolent Neck: Supple without JVD or lymphadenopathy Heart: Reg rate and rhythm. No murmurs rubs or gallops Chest: CTA bilaterally without wheezes, rales, or rhonchi; no distress Abdomen: Soft, non-tender, non-distended, bowel sounds positive. Extremities: No clubbing, left arm hematoma- swollen, warm Skin: Clean and intact without signs of breakdown Neuro: Somnolent, not following commands Musculoskeletal: Right 3rd digit PIP deformity Psych: Lethargic    Assessment/Plan: 1. Functional deficits secondary to Debility following submassive PE with septic emboli and right heart strain, which require 3+ hours per day of interdisciplinary therapy in a comprehensive inpatient rehab setting.  Physiatrist is providing close team supervision and 24 hour management of active  medical problems listed below.  Physiatrist and rehab team continue to assess barriers to discharge/monitor patient progress toward functional and medical goals  Care Tool:  Bathing              Bathing assist       Upper Body Dressing/Undressing Upper body dressing        Upper body assist      Lower Body Dressing/Undressing Lower body dressing            Lower body assist       Toileting Toileting    Toileting assist Assist for toileting: Minimal Assistance - Patient > 75%     Transfers Chair/bed transfer  Transfers assist           Locomotion Ambulation   Ambulation assist              Walk 10 feet activity   Assist           Walk 50 feet activity   Assist           Walk 150 feet activity   Assist           Walk 10 feet on uneven surface  activity   Assist           Wheelchair     Assist               Wheelchair 50 feet with 2 turns activity    Assist            Wheelchair 150 feet activity     Assist  Blood pressure 130/61, pulse 65, temperature 99.1 F (37.3 C), temperature source Oral, resp. rate 18, height 5\' 2"  (1.575 m), weight 93 kg, SpO2 93 %.  Medical Assessment/Plan: 1.Debility following submassive PE with septic emboli and right heart strain.      -patient may shower -ELOS/Goals: 14-18 days, mod I to min assist -Continue CIR  -Code stroke, stat labs, and CT Head ordered for AMS, family called to see if she fluctuates at baseline as she was wide awake yesterday and barely responsive today- does open eyes but not talking with whereas she was very talkative yesterday. CT negative, appreciate neurology eval. I suspect TIA- returned to baseline within 2 hours- updated son who visited patient later in the day. 2. Antithrombotics: -DVT/anticoagulation:IVC filter -antiplatelet therapy: N/A 3. Pain  Management:oxycodone PRN for low back pain. Continue heating pad and lidocaine patch 4. Mood:Provide emotional support. Denies depression but would like to continue on Buspar.  -antipsychotic agents: N/A 5. Neuropsych: This patientiscapable of making decisions on herown behalf. 6. Skin/Wound Care:continue warm compress to right hematoma 7. Fluids/Electrolytes/Nutrition:Routine in and outs with follow-up chemistries 8. Dementia: Continue Aricept and Memantine 9. Right hip cellulitis: continue Keflex and vancomycin.  10. Constipation: Has BM every three days. Continue Linzess, Senna-docusate HS 11. Insomnia: Continue Melatonin HS.  12. Dry eyes: Continue drops.  13. Recreational therapy: Please bring puzzles for her to do.   >35 minutes spent in assessing patient, calling code stroke, ordering stat CT head and labs and reviewing results, discussing with nurse/her son/neurology  LOS: 1 days A FACE TO FACE EVALUATION WAS PERFORMED  [name] P [name] 05/14/2020, 11:24 AM

## 2020-05-14 NOTE — Evaluation (Signed)
Speech Language Pathology Assessment and Plan  Patient Details  Name: [name] MRN: [id number] Date of Birth: [birth year]  SLP Diagnosis: Cognitive Impairments  Rehab Potential: Good ELOS: 14 days    Today's Date: 05/14/2020 SLP Individual Time: 1430-1445 SLP Individual Time Calculation (min): 15 min   Hospital Problem: Principal Problem:   Debility  Past Medical History:  Past Medical History:  Diagnosis Date  . Arthritis   . Asthma   . Chronic back pain   . Depression   . History of pulmonary embolus (PE)   . Hx-TIA (transient ischemic attack)   . Hyperlipemia   . Hypertension   . Personal history of DVT (deep vein thrombosis)   . Scoliosis   . Sleep apnea    cpap - settingsi at 3   . 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 ARTHROSCOPIC REPAIR ACL  02/03/2004  . IR IVC FILTER PLMT / S&I /IMG GUID/MOD SED  05/10/2020  . LEFT HEART CATH AND CORONARY ANGIOGRAPHY N/A 09/16/2019   Procedure: LEFT HEART CATH AND CORONARY ANGIOGRAPHY;  Surgeon: Nelva Bush, MD;  Location: [facility];  Service: Cardiovascular;  Laterality: N/A;  . LOOP RECORDER INSERTION N/A 02/21/2020   Procedure: LOOP RECORDER INSERTION;  Surgeon: Deboraha Sprang, MD;  Location: [facility];  Service: Cardiovascular;  Laterality: N/A;  . OOPHORECTOMY  1970  . SPINE SURGERY  03/31/2006  . TOTAL KNEE ARTHROPLASTY Left 05/17/2016   Procedure: LEFT TOTAL KNEE ARTHROPLASTY;  Surgeon: Mcarthur Rossetti, MD;  Location: [facility];  Service: Orthopedics;  Laterality: Left;  Adductor Block; Spinal to [employer]    Assessment / Plan / Recommendation Clinical Impression   [name] is a 79 y.o. female with a PMH of DVT/PE, Takatsubo cardiomyopathy, HTN, OSA, who was admitted syncopal episode and was found to have a submassive PE with saddle emboli and right heart strain. She received tPA  and developed left arm hematoma at the infusion site. She required transfusion for acute blood loss anemia. Anticoagulation was discontinued due to drop in hemoglobin. IVC filter was placed on 05/10/20.  Pt with code STROKE earlier this afternoon (10/3) due to period of unresponsiveness.  CT head negative for acute event and pt cleared by RN for bed level evaluation.  SLP evaluation was completed on 05/14/20 with results as follows: Pt presents with mild cognitive deficits and scored a 19 out of 26 possible points on [place] Mental Status Exam (SLUMS; clock drawing task was not administered due to limited handwriting secondary to weakness).  Deficits were most notable for memory.  Pt's memory is slightly worse than at time of discharge from CIR 02/2020 which I suspect is a reflection of her current debility in light of acute hospitalization.  Pt would benefit from skilled ST while inpatient in order to maximize functional independence and reduce burden of care prior to discharge.  Anticipate that pt will continue to need 24/7 care at discharge and possible home health ST follow up post discharge pending progress made while inpatient.      Skilled Therapeutic Interventions          Cognitive-linguistic evaluation completed with results and recommendations reviewed with patient.    SLP Assessment  Patient will need skilled [facility] during CIR admission    Recommendations  Patient destination: Home Follow up Recommendations: None Equipment Recommended: None  recommended by SLP    SLP Frequency 3 to 5 out of 7 days   SLP Duration  SLP Intensity  SLP Treatment/Interventions 14 days  Minumum of 1-2 x/day, 30 to 90 minutes  Cognitive remediation/compensation;Cueing hierarchy;Internal/external aids;Patient/family education    Pain Pain Assessment Pain Scale: 0-10 Pain Score: 0-No pain  Prior Functioning Cognitive/Linguistic Baseline: Baseline  deficits Baseline deficit details: hx of memory deficits Type of Home: House  Lives With: Family Available Help at Discharge: Family;Available 24 hours/day  SLP Evaluation Cognition Overall Cognitive Status: Impaired/Different from baseline Arousal/Alertness: Awake/alert Orientation Level: Oriented X4 Attention: Sustained Sustained Attention: Appears intact Memory: Impaired Memory Impairment: Decreased recall of new information;Storage deficit;Retrieval deficit Immediate Memory Recall: Sock;Blue;Bed Memory Recall Sock: With Cue Memory Recall Blue: Without Cue Memory Recall Bed: Without Cue Awareness: Appears intact Problem Solving: Impaired Problem Solving Impairment: Functional complex Safety/Judgment: Appears intact  Comprehension Auditory Comprehension Overall Auditory Comprehension: Appears within functional limits for tasks assessed Expression Expression Primary Mode of Expression: Verbal Verbal Expression Overall Verbal Expression: Appears within functional limits for tasks assessed Written Expression Dominant Hand: Right (per pt, right handed) Oral Motor Oral Motor/Sensory Function Overall Oral Motor/Sensory Function: Mild impairment Motor Speech Overall Motor Speech: Appears within functional limits for tasks assessed  Care Tool Care Tool Cognition Expression of Ideas and Wants Expression of Ideas and Wants: Some difficulty - exhibits some difficulty with expressing needs and ideas (e.g, some words or finishing thoughts) or speech is not clear   Understanding Verbal and Non-Verbal Content Understanding Verbal and Non-Verbal Content: Understands (complex and basic) - clear comprehension without cues or repetitions   Memory/Recall Ability *first 3 days only Memory/Recall Ability *first 3 days only: Staff names and faces;That he or she is in a hospital/hospital unit;Current season        Short Term Goals: Week 1: SLP Short Term Goal 1 (Week 1): Pt will utilize  external memory aids to facilitate recall of daily information with min assist. SLP Short Term Goal 2 (Week 1): Pt will direct her care during basic tasks with min assist.  Refer to Care Plan for Long Term Goals  Recommendations for other services: None   Discharge Criteria: Patient will be discharged from SLP if patient refuses treatment 3 consecutive times without medical reason, if treatment goals not met, if there is a change in medical status, if patient makes no progress towards goals or if patient is discharged from hospital.  The above assessment, treatment plan, treatment alternatives and goals were discussed and mutually agreed upon: by patient  Emilio Math 05/14/2020, 3:52 PM

## 2020-05-14 NOTE — Plan of Care (Signed)
  Problem: RH Grooming Goal: LTG Patient will perform grooming w/assist,cues/equip (OT) Description: LTG: Patient will perform grooming with assist, with/without cues using equipment (OT) Flowsheets (Taken 05/14/2020 1607) LTG: Pt will perform grooming with assistance level of: Supervision/Verbal cueing   Problem: RH Bathing Goal: LTG Patient will bathe all body parts with assist levels (OT) Description: LTG: Patient will bathe all body parts with assist levels (OT) Flowsheets (Taken 05/14/2020 1607) LTG: Pt will perform bathing with assistance level/cueing: Minimal Assistance - Patient > 75%   Problem: RH Dressing Goal: LTG Patient will perform upper body dressing (OT) Description: LTG Patient will perform upper body dressing with assist, with/without cues (OT). Flowsheets (Taken 05/14/2020 1607) LTG: Pt will perform upper body dressing with assistance level of: Minimal Assistance - Patient > 75% Goal: LTG Patient will perform lower body dressing w/assist (OT) Description: LTG: Patient will perform lower body dressing with assist, with/without cues in positioning using equipment (OT) Flowsheets (Taken 05/14/2020 1607) LTG: Pt will perform lower body dressing with assistance level of: Minimal Assistance - Patient > 75%   Problem: RH Toileting Goal: LTG Patient will perform toileting task (3/3 steps) with assistance level (OT) Description: LTG: Patient will perform toileting task (3/3 steps) with assistance level (OT)  Flowsheets (Taken 05/14/2020 1607) LTG: Pt will perform toileting task (3/3 steps) with assistance level: Minimal Assistance - Patient > 75%   Problem: RH Toilet Transfers Goal: LTG Patient will perform toilet transfers w/assist (OT) Description: LTG: Patient will perform toilet transfers with assist, with/without cues using equipment (OT) Flowsheets (Taken 05/14/2020 1607) LTG: Pt will perform toilet transfers with assistance level of: Supervision/Verbal cueing   Problem:  RH Tub/Shower Transfers Goal: LTG Patient will perform tub/shower transfers w/assist (OT) Description: LTG: Patient will perform tub/shower transfers with assist, with/without cues using equipment (OT) Flowsheets (Taken 05/14/2020 1607) LTG: Pt will perform tub/shower stall transfers with assistance level of: Contact Guard/Touching assist

## 2020-05-14 NOTE — Progress Notes (Signed)
Called to room at lunch time. Patient lethargic, drooling. Patient follows commands but not consistently. Neuro change from last assessment. Code Stroke called and MD Raulkar notified-at bedside. This nurse went down with patient and rapid response nurse. While patient was being transferred to table for scan-patient become more alert and appropriate. Neuro assessment done. Patient appears back to baseline. MD Raulkar made aware. After patient transferred back to room, patient able to take medication and liquids without issue. Call bell left within reach.Continue to monitor.

## 2020-05-15 ENCOUNTER — Inpatient Hospital Stay (HOSPITAL_COMMUNITY): Payer: Medicare Other | Admitting: Occupational Therapy

## 2020-05-15 ENCOUNTER — Inpatient Hospital Stay (HOSPITAL_COMMUNITY): Payer: Medicare Other

## 2020-05-15 DIAGNOSIS — G8929 Other chronic pain: Secondary | ICD-10-CM

## 2020-05-15 DIAGNOSIS — E8809 Other disorders of plasma-protein metabolism, not elsewhere classified: Secondary | ICD-10-CM

## 2020-05-15 DIAGNOSIS — R7401 Elevation of levels of liver transaminase levels: Secondary | ICD-10-CM

## 2020-05-15 DIAGNOSIS — L03115 Cellulitis of right lower limb: Secondary | ICD-10-CM

## 2020-05-15 DIAGNOSIS — M545 Low back pain, unspecified: Secondary | ICD-10-CM

## 2020-05-15 DIAGNOSIS — E46 Unspecified protein-calorie malnutrition: Secondary | ICD-10-CM

## 2020-05-15 DIAGNOSIS — D62 Acute posthemorrhagic anemia: Secondary | ICD-10-CM

## 2020-05-15 LAB — PROLACTIN: Prolactin: 26.5 ng/mL — ABNORMAL HIGH (ref 4.8–23.3)

## 2020-05-15 MED ORDER — ONDANSETRON HCL 4 MG/2ML IJ SOLN: 4.0000 mg | Freq: Three times a day (TID) | INTRAMUSCULAR | Status: DC | PRN

## 2020-05-15 MED ORDER — ACETAMINOPHEN 325 MG PO TABS
325.0000 mg | ORAL_TABLET | Freq: Four times a day (QID) | ORAL | Status: DC | PRN
  Filled 2020-05-15 (×2): qty 1

## 2020-05-15 MED ORDER — ALUM & MAG HYDROXIDE-SIMETH 200-200-20 MG/5ML PO SUSP: 15.0000 mL | ORAL | Status: DC | PRN

## 2020-05-15 MED ORDER — OXYCODONE HCL 5 MG PO TABS
15.0000 mg | ORAL_TABLET | Freq: Four times a day (QID) | ORAL | Status: DC | PRN
  Administered 2020-05-15 – 2020-05-24 (×15): 15 mg via ORAL
  Filled 2020-05-15 (×15): qty 3

## 2020-05-15 MED ORDER — OXYCODONE HCL 5 MG PO TABS: 10.0000 mg | ORAL_TABLET | Freq: Four times a day (QID) | ORAL | Status: DC | PRN

## 2020-05-15 MED ORDER — ONDANSETRON HCL 4 MG PO TABS: 4.0000 mg | ORAL_TABLET | Freq: Three times a day (TID) | ORAL | Status: DC | PRN

## 2020-05-15 MED ORDER — ASPIRIN EC 81 MG PO TBEC
81.0000 mg | DELAYED_RELEASE_TABLET | Freq: Every day | ORAL | Status: DC
  Administered 2020-05-15 – 2020-05-24 (×10): 81 mg via ORAL
  Filled 2020-05-15 (×10): qty 1

## 2020-05-15 MED ORDER — PROSOURCE PLUS PO LIQD
30.0000 mL | Freq: Two times a day (BID) | ORAL | Status: DC
  Administered 2020-05-15 – 2020-05-24 (×18): 30 mL via ORAL
  Filled 2020-05-15 (×18): qty 30

## 2020-05-15 NOTE — Progress Notes (Signed)
Speech Language Pathology Daily Session Note  Patient Details  Name: [name] MRN: [id number] Date of Birth: [birth year]  Today's Date: 05/15/2020 SLP Individual Time: 0803-0900 SLP Individual Time Calculation (min): 57 min  Short Term Goals: Week 1: SLP Short Term Goal 1 (Week 1): Pt will utilize external memory aids to facilitate recall of daily information with min assist. SLP Short Term Goal 2 (Week 1): Pt will direct her care during basic tasks with min assist.  Skilled Therapeutic Interventions:Skilled ST services focused on cognitive skills. Pt expressed desire to the the bathroom upon SLP entering room. SLP and NT assisted pt in transfer with [facility], pt required mod physical assist, directing care with min A verbal cues. SLP provided breakfast tray set up, pt required mod A verbal cues to direct care. Pt was unable to recall events from Saturday, OT/PT/SLP evaluations, " I don't remember a thing." SLP initiated memory notebook in which pt required min A verbal cues to recall events from today's session. SLP provide pt phone number of daughter-in-aw, per pt to request to check on son's knee surgery. Pt was left in room with call bell within reach and bed alarm set. SLP recommends to continue skilled services.     Pain Pain Assessment Pain Score: 0-No pain  Therapy/Group: Individual Therapy  Giovanna Kemmerer  [facility] 05/15/2020, 12:59 PM

## 2020-05-15 NOTE — Progress Notes (Signed)
Inpatient Rehabilitation Center Individual Statement of Services  Patient Name:  [name]  Date:  05/15/2020  Welcome to the Inpatient Rehabilitation Center.  Our goal is to provide you with an individualized program based on your diagnosis and situation, designed to meet your specific needs.  With this comprehensive rehabilitation program, you will be expected to participate in at least 3 hours of rehabilitation therapies Monday-Friday, with modified therapy programming on the weekends.  Your rehabilitation program will include the following services:  Physical Therapy (PT), Occupational Therapy (OT), Speech Therapy (ST), 24 hour per day rehabilitation nursing, Care Coordinator, Rehabilitation Medicine, Nutrition Services and Pharmacy Services  Weekly team conferences will be held on Wednesday to discuss your progress.  Your Inpatient Rehabilitation Care Coordinator will talk with you frequently to get your input and to update you on team discussions.  Team conferences with you and your family in attendance may also be held.  Expected length of stay: 14-18 days  Overall anticipated outcome: supervision - min level  Depending on your progress and recovery, your program may change. Your Inpatient Rehabilitation Care Coordinator will coordinate services and will keep you informed of any changes. Your Inpatient Rehabilitation Care Coordinator's name and contact numbers are listed  below.  The following services may also be recommended but are not provided by the Inpatient Rehabilitation Center:    Home Health Rehabiltiation Services  Outpatient Rehabilitation Services    Arrangements will be made to provide these services after discharge if needed.  Arrangements include referral to agencies that provide these services.  Your insurance has been verified to be:  Medicare & [employer] Your primary doctor is:  [name]  Pertinent information will be shared with your doctor and your  insurance company.  Inpatient Rehabilitation Care Coordinator:  Dossie Der, [place] [phone] or Luna Glasgow  Information discussed with and copy given to patient by: Lucy Chris, 05/15/2020, 11:46 AM

## 2020-05-15 NOTE — Progress Notes (Signed)
Pt stated she doesn't wear CPAP at home and didn't want to wear one tonight.

## 2020-05-15 NOTE — Progress Notes (Signed)
Occupational Therapy Session Note  Patient Details  Name: [name] MRN: [id number] Date of Birth: Oct 10, 1940  Today's Date: 05/15/2020 OT Individual Time: 1000-1055 OT Individual Time Calculation (min): 55 min    Short Term Goals: Week 1:  OT Short Term Goal 1 (Week 1): Pt will complete UB bathing with Mod A OT Short Term Goal 2 (Week 1): Pt will complete 1 grooming task while sitting at the sink with mod cuing for locating items in Rt visual field OT Short Term Goal 3 (Week 1): Pt will thread 1 LE into pants with Mod A  Skilled Therapeutic Interventions/Progress Updates:    Treatment session with focus on self-care retraining, functional mobility, sitting balance, sit > stand, and attention to Rt visual field.  Pt reports feeling "blah" today but not complaints of pain.  Therapist encouraging pt to engage in self-care tasks from EOB to address sitting balance.  Pt reports need to have BM and requested bed pan.  Therapist encouraged pt to utilize [facility] for toileting.  Completed bed mobility with mod-max assist with frequent encouragement during mobility.  Completed sit > stand in [place] with therapist providing support along LUE due to edema and pain and 2nd person to assist with sit > stand.  Mod A +2 sit > stand in [place] and transferred to [facility].  Pt with loose stool on BSC requiring increased time due to continued BM.  Therapist educated pt on hemi-dressing technique and assisted with threading LUE first and then assisted with all aspects of donning shirt due to decreased ROM, pain, and edema with movement of LUE.  Completed sit > stand Mod A +2 in [place] for hygiene.  Pt able to maintain standing with min assist while therapist completed hygiene.  +2 to don brief due to time constraints as pt reporting fatigue and requesting to sit.  Returned to bed and transferred supine with +2 assistance due to fatigue.  Pt maintained arousal and able to follow commands throughout session, however very  limited by decreased endurance and LUE.  Therapist positioned to Rt frequently during session to increase attention to Rt visual field.  Pt remained semi-reclined in bed with LUE positioned on pillow for pain and edema management.  Therapy Documentation Precautions:  Restrictions Weight Bearing Restrictions: No General:   Vital Signs: Therapy Vitals BP: 130/66 Pain:  Pt with no c/o pain   Therapy/Group: Individual Therapy  Rosalio Loud 05/15/2020, 12:06 PM

## 2020-05-15 NOTE — Discharge Instructions (Signed)
Inpatient Rehab Discharge Instructions  LAURAJEAN HOSEK Discharge date and time:    Activities/Precautions/ Functional Status: Activity: no lifting, driving, or strenuous exercise till cleared by MD Diet: low fat, low cholesterol diet Wound Care:   Functional status:  ___ No restrictions     ___ Walk up steps independently ___ 24/7 supervision/assistance   ___ Walk up steps with assistance ___ Intermittent supervision/assistance  ___ Bathe/dress independently ___ Walk with walker     ___ Bathe/dress with assistance ___ Walk Independently    ___ Shower independently ___ Walk with assistance    ___ Shower with assistance ___ No alcohol     ___ Return to work/school ________  Special Instructions:    My questions have been answered and I understand these instructions. I will adhere to these goals and the provided educational materials after my discharge from the hospital.  Patient/Caregiver Signature _______________________________ Date __________  Clinician Signature _______________________________________ Date __________  Please bring this form and your medication list with you to all your follow-up doctor's appointments.

## 2020-05-15 NOTE — Progress Notes (Signed)
Inpatient Rehabilitation  Patient information reviewed and entered into eRehab system by Donn Wilmot M. Edie Vallandingham, M.A., CCC/SLP, PPS Coordinator.  Information including medical coding, functional ability and quality indicators will be reviewed and updated through discharge.    

## 2020-05-15 NOTE — Progress Notes (Signed)
[facility] PHYSICAL MEDICINE & REHABILITATION PROGRESS NOTE   Subjective/Complaints: Patient seen laying in bed this morning and then transferring to steady.  She states she slept well overnight.  She states she needs to use the restroom.  She is more interactive compared to previous admission.  Discussed with weekend physician regarding unresponsive episode.  ROS: Denies CP, SOB, N/V/D  Objective:   CT HEAD CODE STROKE WO CONTRAST`  Result Date: 05/14/2020 CLINICAL DATA:  Code stroke. Delirium. Assess for stroke. Lethargy and slurred speech. EXAM: CT HEAD WITHOUT CONTRAST TECHNIQUE: Contiguous axial images were obtained from the base of the skull through the vertex without intravenous contrast. COMPARISON:  CT head May 05, 2020 FINDINGS: Brain: No acute intracranial hemorrhage, mass effect, or midline shift. Similar patchy white matter hypoattenuation, likely secondary to chronic microvascular ischemic disease. No evidence of acute large vascular territory infarct. 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 No hydrocephalus. No mass effect. No mass lesion. Vascular: Calcific atherosclerosis. Skull: Normal. Negative for fracture or focal lesion. Sinuses/Orbits: No acute finding. Other: No mastoid effusions. ASPECTS [facility] Stroke Program Early CT Score) Total score (0-10 with 10 being normal): 10 IMPRESSION: 1. No evidence of acute intracranial abnormality. ASPECTS is 10 2. 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 3. Similar chronic microvascular ischemic disease. Code stroke imaging results were communicated on 05/14/2020 at 1:17 pm to provider Dr. Thomasena Edis via telephone, who verbally acknowledged these results. Electronically Signed   By: Feliberto Harts MD   On: 05/14/2020 13:21   CT ANGIO HEAD CODE STROKE  Result Date: 05/14/2020 CLINICAL DATA:  Stroke follow-up. Altered mental status. Known pulmonary embolus. EXAM: CT ANGIOGRAPHY HEAD AND NECK  TECHNIQUE: Multidetector CT imaging of the head and neck was performed using the standard protocol during bolus administration of intravenous contrast. Multiplanar CT image reconstructions and MIPs were obtained to evaluate the vascular anatomy. Carotid stenosis measurements (when applicable) are obtained utilizing NASCET criteria, using the distal internal carotid diameter as the denominator. CONTRAST:  82mL OMNIPAQUE IOHEXOL 350 MG/ML SOLN COMPARISON:  CTA 02/16/2020 FINDINGS: CTA NECK FINDINGS Aortic arch: Aortic atherosclerosis. Great vessel origins are patent. Right carotid system: Calcified plaque at the bifurcation without significant stenosis. Mild irregularity of the internal carotid artery at the skull base, similar to prior. Left carotid system: Calcified plaque at the bifurcation without significant stenosis. Mild irregularity of the internal carotid artery at the skull base, similar to prior. Vertebral arteries: Mildly left dominant. Mild calcific atherosclerosis at the right origin. No evidence of significant stenosis or occlusion. Skeleton: Degenerative changes of the cervical spine. Other neck: No mass or adenopathy. Upper chest: Large bilateral acute pulmonary embolus, better characterized on recent CTA chest Review of the MIP images confirms the above findings CTA HEAD FINDINGS Anterior circulation: No significant stenosis, proximal occlusion, aneurysm, or vascular malformation. Posterior circulation: The basilar artery is patent. Bilateral posterior cerebral arteries are patent and appear similar to prior. Tortuous basilar artery. Left fetal type PCA. No evidence of significant (greater than 50%) stenosis or occlusion. Similar appearance relative to prior. Venous sinuses: As permitted by contrast timing, patent. Review of the MIP images confirms the above findings IMPRESSION: 1. No evidence of large vessel occlusion or hemodynamically significant stenosis. 2. Large bilateral acute pulmonary  embolus, better characterized on recent CTA chest Code stroke imaging results were communicated on 05/14/2020 at 1:42 pm to provider Dr. Thomasena Edis via telephone, who verbally acknowledged these results. Electronically Signed   By: Thornton Dales  Yetta Barre MD   On: 05/14/2020 13:43   CT ANGIO NECK CODE STROKE  Result Date: 05/14/2020 CLINICAL DATA:  Stroke follow-up. Altered mental status. Known pulmonary embolus. EXAM: CT ANGIOGRAPHY HEAD AND NECK TECHNIQUE: Multidetector CT imaging of the head and neck was performed using the standard protocol during bolus administration of intravenous contrast. Multiplanar CT image reconstructions and MIPs were obtained to evaluate the vascular anatomy. Carotid stenosis measurements (when applicable) are obtained utilizing NASCET criteria, using the distal internal carotid diameter as the denominator. CONTRAST:  49mL OMNIPAQUE IOHEXOL 350 MG/ML SOLN COMPARISON:  CTA 02/16/2020 FINDINGS: CTA NECK FINDINGS Aortic arch: Aortic atherosclerosis. Great vessel origins are patent. Right carotid system: Calcified plaque at the bifurcation without significant stenosis. Mild irregularity of the internal carotid artery at the skull base, similar to prior. Left carotid system: Calcified plaque at the bifurcation without significant stenosis. Mild irregularity of the internal carotid artery at the skull base, similar to prior. Vertebral arteries: Mildly left dominant. Mild calcific atherosclerosis at the right origin. No evidence of significant stenosis or occlusion. Skeleton: Degenerative changes of the cervical spine. Other neck: No mass or adenopathy. Upper chest: Large bilateral acute pulmonary embolus, better characterized on recent CTA chest Review of the MIP images confirms the above findings CTA HEAD FINDINGS Anterior circulation: No significant stenosis, proximal occlusion, aneurysm, or vascular malformation. Posterior circulation: The basilar artery is patent. Bilateral posterior cerebral  arteries are patent and appear similar to prior. Tortuous basilar artery. Left fetal type PCA. No evidence of significant (greater than 50%) stenosis or occlusion. Similar appearance relative to prior. Venous sinuses: As permitted by contrast timing, patent. Review of the MIP images confirms the above findings IMPRESSION: 1. No evidence of large vessel occlusion or hemodynamically significant stenosis. 2. Large bilateral acute pulmonary embolus, better characterized on recent CTA chest Code stroke imaging results were communicated on 05/14/2020 at 1:42 pm to provider Dr. Thomasena Edis via telephone, who verbally acknowledged these results. Electronically Signed   By: Feliberto Harts MD   On: 05/14/2020 13:43   Recent Labs    05/13/20 0211 05/14/20 1408  WBC 11.0* 10.6*  HGB 10.0* 10.3*  HCT 32.0* 33.3*  PLT 176 185   Recent Labs    05/13/20 0211 05/14/20 1408  NA 136 139  K 3.7 4.2  CL 102 104  CO2 24 24  GLUCOSE 107* 96  BUN 12 11  CREATININE 0.77 0.73  CALCIUM 8.6* 8.9    Intake/Output Summary (Last 24 hours) at 05/15/2020 1241 Last data filed at 05/15/2020 0929 Gross per 24 hour  Intake 320 ml  Output --  Net 320 ml        Physical Exam: Vital Signs Blood pressure 130/66, pulse 60, temperature 98 F (36.7 C), resp. rate 18, height 5\' 2"  (1.575 m), weight 93 kg, SpO2 96 %. Constitutional: No distress . Vital signs reviewed. HENT: Normocephalic.  Atraumatic. Eyes: EOMI. No discharge. Cardiovascular: No JVD.  RRR. Respiratory: Normal effort.  No stridor.  Bilateral clear to auscultation. GI: Non-distended.  BS +. Skin: Warm and dry.  Intact. Psych: Slightly anxious/confused Musc: Left upper extremity with edema, no tenderness. Neuro: Alert Motor: Left upper extremity: 0/5 proximal to distal Right upper extremity: 4/5 proximal distal  Assessment/Plan: 1. Functional deficits secondary to Debility following submassive PE with septic emboli and right heart strain, which  require 3+ hours per day of interdisciplinary therapy in a comprehensive inpatient rehab setting.  Physiatrist is providing close team supervision and 24 hour management  of active medical problems listed below.  Physiatrist and rehab team continue to assess barriers to discharge/monitor patient progress toward functional and medical goals  Care Tool:  Bathing        Body parts bathed by helper: Right arm, Left arm, Chest, Abdomen, Front perineal area, Buttocks, Right upper leg, Left upper leg, Left lower leg, Face, Right lower leg     Bathing assist Assist Level: Total Assistance - Patient < 25% (using Stedy lift for sit<stand)     Upper Body Dressing/Undressing Upper body dressing   What is the patient wearing?: Pull over shirt    Upper body assist Assist Level: Total Assistance - Patient < 25%    Lower Body Dressing/Undressing Lower body dressing      What is the patient wearing?: Incontinence brief     Lower body assist Assist for lower body dressing: 2 Helpers     Toileting Toileting    Toileting assist Assist for toileting: 2 Helpers     Transfers Chair/bed transfer  Transfers assist     Chair/bed transfer assist level: 2 Helpers     Locomotion Ambulation   Ambulation assist              Walk 10 feet activity   Assist           Walk 50 feet activity   Assist           Walk 150 feet activity   Assist           Walk 10 feet on uneven surface  activity   Assist           Wheelchair     Assist               Wheelchair 50 feet with 2 turns activity    Assist            Wheelchair 150 feet activity     Assist          Blood pressure 130/66, pulse 60, temperature 98 F (36.7 C), resp. rate 18, height 5\' 2"  (1.575 m), weight 93 kg, SpO2 96 %.  Medical Assessment/Plan: 1.Debility following submassive PE with septic emboli and right heart strain.   Continue CIR   CT Head negative,  appreciate neurology eval.  2. Antithrombotics: -DVT/anticoagulation:IVC filter -antiplatelet therapy: N/A 3. Pain Management:oxycodone PRN for low back pain. Continue heating pad and lidocaine patch  Appears controlled with meds on 10/4 4. Mood:Provide emotional support. Denies depression but would like to continue on Buspar.  -antipsychotic agents: N/A 5. Neuropsych: This patientisnot fully capable of making decisions on herown behalf. 6. Skin/Wound Care:continue warm compress to right hematoma 7. Fluids/Electrolytes/Nutrition:Routine in and outs. 8. Dementia: Continue Aricept and Memantine 9. Right hip cellulitis: continue Keflex through today  Vancomycin DC'd 10.  Constipation: Has BM every three days. Continue Linzess, Senna-docusate HS 11.  Sleep disturbance: Continue Melatonin HS.   Appears to be improving 12. Dry eyes: Continue drops.  13.  Hypoalbuminemia  Supplement initiated on 10/4 14.  Mild transaminitis  AST elevated on 10/3, continue to monitor 15.  Leukocytosis  WBCs 10.6 on 10/3  Afebrile  See #9  Continue to monitor 16.  Acute blood loss anemia  Hemoglobin 10.3 on 10/3  Continue to monitor  LOS: 2 days A FACE TO FACE EVALUATION WAS PERFORMED  Marcellius Montagna 12/4 05/15/2020, 12:41 PM

## 2020-05-15 NOTE — Evaluation (Signed)
Physical Therapy Assessment and Plan  Patient Details  Name: [name] MRN: [id number] Date of Birth: 1941-05-30  PT Diagnosis: Abnormal posture, Abnormality of gait, Cognitive deficits, Coordination disorder, Difficulty walking, Edema, Impaired cognition, Muscle weakness and Pain in L UE Rehab Potential: Good ELOS: 14-18 days   Today's Date: 05/15/2020 PT Individual Time: 1330-1449 PT Individual Time Calculation (min): 79 min    Hospital Problem: Principal Problem:   Debility Active Problems:   Acute blood loss anemia   Transaminitis   Chronic bilateral low back pain without sciatica   Past Medical History:  Past Medical History:  Diagnosis Date  . Arthritis   . Asthma   . Chronic back pain   . Depression   . History of pulmonary embolus (PE)   . Hx-TIA (transient ischemic attack)   . Hyperlipemia   . Hypertension   . Personal history of DVT (deep vein thrombosis)   . Scoliosis   . Sleep apnea    cpap - settingsi at 2   . 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 ARTHROSCOPIC REPAIR ACL  02/03/2004  . IR IVC FILTER PLMT / S&I /IMG GUID/MOD SED  05/10/2020  . LEFT HEART CATH AND CORONARY ANGIOGRAPHY N/A 09/16/2019   Procedure: LEFT HEART CATH AND CORONARY ANGIOGRAPHY;  Surgeon: Nelva Bush, MD;  Location: [place] [facility];  Service: Cardiovascular;  Laterality: N/A;  . LOOP RECORDER INSERTION N/A 02/21/2020   Procedure: LOOP RECORDER INSERTION;  Surgeon: Deboraha Sprang, MD;  Location: [place] [facility];  Service: Cardiovascular;  Laterality: N/A;  . OOPHORECTOMY  1970  . SPINE SURGERY  03/31/2006  . 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 TUBAL LIGATION  1983    Assessment & Plan Clinical Impression:  Patient is a 79 y.o. 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  Pt found to have submassive PE with saddle emboli and R heart strain.  Cardiology consulted for elevated troponin and they did not suspect ACS.  PCCM consulted and recommended TPA and transfer to ICU.  TPA complicated by left arm hematoma in the infusion site, requiring ABLA and transfusion.  Due to persistent drop in hemoglobin anticoagulation discontinued.  CT abd/pelvis negative for retroperitoneal bleed, but did show R hip cellulitis.  Pt had an IVC filter placed on 05/10/2020. Therapy evaluations completed and pt was recommended for CIR.   Patient currently requires max with mobility secondary to muscle weakness, decreased cardiorespiratoy endurance, decreased problem solving, decreased safety awareness and decreased memory and decreased sitting balance, decreased standing balance, decreased postural control and decreased balance strategies.  Prior to hospitalization, patient was min with mobility and lived with Family in a House home.  Home access is  Level entry.  Patient will benefit from skilled PT intervention to maximize safe functional mobility, minimize fall risk and decrease caregiver burden for planned discharge home with 24 hour assist.  Anticipate patient will benefit from follow up [facility] at discharge.  PT - End of Session Activity Tolerance: Tolerates 30+ min activity with multiple rests Endurance Deficit: Yes Endurance Deficit Description: pt required multiple rest breaks and unable to perform car transfer due to  fatigue PT Assessment Rehab Potential (ACUTE/IP ONLY): Good PT Barriers to Discharge: Incontinence PT Barriers to Discharge Comments: poor activity tolerance and generalized weakness PT Patient demonstrates impairments in the following area(s): Balance;Edema;Endurance;Motor;Pain PT  Transfers Functional Problem(s): Bed Mobility;Bed to Chair;Car;Furniture PT Locomotion Functional Problem(s): Ambulation;Wheelchair Mobility;Stairs PT Plan PT Intensity: Minimum of 1-2 x/day ,45 to 90 minutes PT Frequency: 5 out of 7 days PT Duration Estimated Length of Stay: 14-18 days PT Treatment/Interventions: Ambulation/gait training;DME/adaptive equipment instruction;Psychosocial support;UE/LE Strength taining/ROM;Balance/vestibular training;Functional electrical stimulation;Skin care/wound management;UE/LE Coordination activities;Cognitive remediation/compensation;Functional mobility training;Splinting/orthotics;Community reintegration;Neuromuscular re-education;Stair training;Wheelchair propulsion/positioning;Discharge planning;Pain management;Therapeutic Activities;Disease management/prevention;Patient/family education;Therapeutic Exercise PT Transfers Anticipated Outcome(s): supervision with LRAD PT Locomotion Anticipated Outcome(s): supervision with LRAD PT Recommendation Follow Up Recommendations: Home health PT Patient destination: Home Equipment Recommended: To be determined Equipment Details: pt has RW   PT Evaluation Precautions/Restrictions Precautions Precautions: Fall Restrictions Weight Bearing Restrictions: No Home Living/Prior Functioning Home Living Available Help at Discharge: Family;Available 24 hours/day Type of Home: House Home Access: Level entry Home Layout: One level Bathroom Shower/Tub: Multimedia programmer: Standard Bathroom Accessibility: Yes Additional Comments: already has RW  Lives With: Family Prior Function Level of Independence: Requires assistive device for independence;Needs assistance with ADLs;Needs assistance with homemaking  Able to Take Stairs?: No Driving: No 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 Per pt, she is  discharging home to her son's house where he and his wife can provide 24/7 assist. Cognition Overall Cognitive Status: Impaired/Different from baseline Arousal/Alertness: Awake/alert Orientation Level: Oriented X4 Memory: Impaired Awareness: Appears intact Problem Solving: Impaired Safety/Judgment: Appears intact Sensation Sensation Light Touch: Appears Intact Proprioception: Appears Intact Coordination Gross Motor Movements are Fluid and Coordinated: No Fine Motor Movements are Fluid and Coordinated: No Coordination and Movement Description: grossly uncoordinated due to pain in L UE, decreased balance/postural control, generalized weakness, and poor activity tolerance Finger Nose Finger Test: WFL on R UE, unable to lift L UE due to pain Heel Shin Test: decreased ROM bilaterally Motor  Motor Motor: Hemiplegia;Abnormal postural alignment and control Motor - Skilled Clinical Observations: grossly uncoordinated due to pain in L UE, decreased balance/postural control, generalized weakness, and poor activity tolerance  Trunk/Postural Assessment  Cervical Assessment Cervical Assessment: Exceptions to [facility] (forward head) Thoracic Assessment Thoracic Assessment: Exceptions to [facility] (kyphosis) Lumbar Assessment Lumbar Assessment: Exceptions to [facility] (posterior pelvic tilt) Postural Control Postural Control: Deficits on evaluation  Balance Balance Balance Assessed: Yes Static Sitting Balance Static Sitting - Balance Support: Bilateral upper extremity supported;Feet supported Static Sitting - Level of Assistance: 3: Mod assist Dynamic Sitting Balance Dynamic Sitting - Balance Support: Feet supported;Bilateral upper extremity supported Dynamic Sitting - Level of Assistance: 3: Mod assist Static Standing Balance Static Standing - Balance Support: Bilateral upper extremity supported (RW) Static Standing - Level of Assistance: 4: Min assist Dynamic Standing Balance Dynamic Standing - Balance  Support: Bilateral upper extremity supported (RW) Dynamic Standing - Level of Assistance: 3: Mod assist Extremity Assessment  RLE Assessment RLE Assessment: Exceptions to [facility] General Strength Comments: grossly generalized to 3+/5 LLE Assessment LLE Assessment: Exceptions to [facility] General Strength Comments: grossly generalized to 3+/5  Care Tool Care Tool Bed Mobility Roll left and right activity   Roll left and right assist level: Moderate Assistance - Patient 50 - 74%    Sit to lying activity   Sit to lying assist level: Maximal Assistance - Patient 25 - 49%  Lying to sitting edge of bed activity   Lying to sitting edge of bed assist level: Maximal Assistance - Patient 25 - 49%     Care Tool Transfers Sit to stand transfer   Sit to stand assist level: Maximal Assistance - Patient 25 - 49%    Chair/bed transfer   Chair/bed transfer assist level: Moderate Assistance - Patient 50 - 74%     Toilet transfer   Assist Level: 2 Helpers Customer service manager)    Scientist, product/process development transfer activity did not occur: Safety/medical concerns (fatigue, generalized weakness, decreased balance, pain)        Care Tool Locomotion Ambulation   Assist level: 2 helpers Assistive device: Walker-rolling Max distance: 28f  Walk 10 feet activity   Assist level: 2 helpers Assistive device: Walker-rolling   Walk 50 feet with 2 turns activity Walk 50 feet with 2 turns activity did not occur: Safety/medical concerns (fatigue, generalized weakness, decreased balance, pain)      Walk 150 feet activity Walk 150 feet activity did not occur: Safety/medical concerns (fatigue, generalized weakness, decreased balance, pain)      Walk 10 feet on uneven surfaces activity Walk 10 feet on uneven surfaces activity did not occur: Safety/medical concerns (fatigue, generalized weakness, decreased balance, pain)      Stairs Stair activity did not occur: Safety/medical concerns (fatigue, generalized weakness, decreased  balance, pain)        Walk up/down 1 step activity Walk up/down 1 step or curb (drop down) activity did not occur: Safety/medical concerns (fatigue, generalized weakness, decreased balance, pain)     Walk up/down 4 steps activity did not occuR: Safety/medical concerns (fatigue, generalized weakness, decreased balance, pain)  Walk up/down 4 steps activity      Walk up/down 12 steps activity Walk up/down 12 steps activity did not occur: Safety/medical concerns (fatigue, generalized weakness, decreased balance, pain)      Pick up small objects from floor Pick up small object from the floor (from standing position) activity did not occur: Safety/medical concerns (fatigue, generalized weakness, decreased balance, pain)      Wheelchair Will patient use wheelchair at discharge?: Yes Type of Wheelchair: Manual   Wheelchair assist level: Supervision/Verbal cueing Max wheelchair distance: 174f Wheel 50 feet with 2 turns activity   Assist Level: Total Assistance - Patient < 25%  Wheel 150 feet activity   Assist Level: Dependent - Patient 0%    Refer to Care Plan for Long Term Goals  SHORT TERM GOAL WEEK 1 PT Short Term Goal 1 (Week 1): Pt will perform bed mobility with mod A consistantly PT Short Term Goal 2 (Week 1): Pt will transfer sit<>stand with LRAD min A PT Short Term Goal 3 (Week 1): Pt will perform simulated car transfer with RW and mod A  Recommendations for other services: None   Skilled Therapeutic Intervention Evaluation completed (see details above and below) with education on PT POC and goals and individual treatment initiated with focus on functional mobility/transfers, generalized strengthening, dynamic standing balance/coordination, and improved activity tolerance. Received pt supine in bed, pt educated on PT evaluation, CIR policies, and therapy schedule and agreeable. Pt denied any pain initially but reported increased L UE pain with mobility. Therapist located [place] legrests, and RW prior to getting OOB. Pt reported her brief was soiled and rolled L and R with mod A and use of bedrails and required total A to doff dirty brief, to perform peri-care, and to don clean brief. Donned pants in  supine with max A and pt transferred supine<>sitting EOB max A. Pt transferred sit<>stand with RW and max A from bed and transferred stand<>pivot bed<>WC with RW mod A with cues for hand placement. Pt performed WC mobility 64f using bilateral LEs and R UE and supervision. Pt stated "I can't do anymore" and therapist transported pt remainder of way to therapy [facility]. Pt ambulated 171fwith RW and mod A +2 with cues for upright posture and RW safety as pt pushing RW too far forward. Attempted simulated car transfer however once standing pt unable to move feet and stated "I'm too tired and weak for this right now". [facility] and requested to return to bed. Stand<>pivot WC<>bed with RW mod A and sit<>supine with max A for LE management. Concluded session with pt supine in bed, needs within reach, and bed alarm on.   Mobility Bed Mobility Bed Mobility: Rolling Right;Rolling Left Rolling Right: Moderate Assistance - Patient 50-74% Rolling Left: Moderate Assistance - Patient 50-74% Transfers Transfers: Sit to Stand;Stand to Sit;Stand Pivot Transfers Sit to Stand: Maximal Assistance - Patient 25-49% Stand to Sit: Moderate Assistance - Patient 50-74% Stand Pivot Transfers: Moderate Assistance - Patient 50 - 74% Stand Pivot Transfer Details (indicate cue type and reason): verbal cues for technique, upright posture, and RW safety Transfer (Assistive device): Rolling walker Locomotion  Gait Ambulation: Yes Gait Assistance: 2 Helpers Gait Distance (Feet): 13 Feet Assistive device: Rolling walker Gait Assistance Details: Verbal cues for technique;Verbal cues for safe use of DME/AE Gait Assistance Details: verbal cues for RW safety, upright  posture, and technique Gait Gait: Yes Gait Pattern: Impaired Gait Pattern: Step-to pattern;Decreased stride length;Poor foot clearance - left;Poor foot clearance - right;Trunk flexed;Decreased step length - right;Decreased step length - left;Wide base of support;Decreased trunk rotation Gait velocity: decreased Wheelchair Mobility Wheelchair Mobility: Yes Wheelchair Assistance: Supervision/Verbal cueing Wheelchair Propulsion: Both lower extermities;Right upper extremity Wheelchair Parts Management: Needs assistance Distance: 1041f Discharge Criteria: Patient will be discharged from PT if patient refuses treatment 3 consecutive times without medical reason, if treatment goals not met, if there is a change in medical status, if patient makes no progress towards goals or if patient is discharged from hospital.  The above assessment, treatment plan, treatment alternatives and goals were discussed and mutually agreed upon: by patient  AnnAlfonse Alpers, DPT  05/15/2020, 3:37 PM

## 2020-05-15 NOTE — Progress Notes (Signed)
Patient Details  Name: [name] MRN: [id number] Date of Birth: April 29, 1941  Today's Date: 05/15/2020  Hospital Problems: Principal Problem:   Debility  Past Medical History:  Past Medical History:  Diagnosis Date  . Arthritis   . Asthma   . Chronic back pain   . Depression   . History of pulmonary embolus (PE)   . Hx-TIA (transient ischemic attack)   . Hyperlipemia   . Hypertension   . Personal history of DVT (deep vein thrombosis)   . Scoliosis   . Sleep apnea    cpap - settingsi at 2   . 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 ARTHROSCOPIC REPAIR ACL  02/03/2004  . IR IVC FILTER PLMT / S&I /IMG GUID/MOD SED  05/10/2020  . LEFT HEART CATH AND CORONARY ANGIOGRAPHY N/A 09/16/2019   Procedure: LEFT HEART CATH AND CORONARY ANGIOGRAPHY;  Surgeon: Yvonne Kendall, MD;  Location: [facility];  Service: Cardiovascular;  Laterality: N/A;  . LOOP RECORDER INSERTION N/A 02/21/2020   Procedure: LOOP RECORDER INSERTION;  Surgeon: Duke Salvia, MD;  Location: [facility];  Service: Cardiovascular;  Laterality: N/A;  . OOPHORECTOMY  1970  . SPINE SURGERY  03/31/2006  . TOTAL KNEE ARTHROPLASTY Left 05/17/2016   Procedure: LEFT TOTAL KNEE ARTHROPLASTY;  Surgeon: Kathryne Hitch, MD;  Location: [facility];  Service: Orthopedics;  Laterality: Left;  Adductor Block; Spinal to General  . TUBAL LIGATION  1983   Social History:  reports that she quit smoking about 46 years ago. Her smoking use included cigarettes. She has a 10.00 pack-year smoking history. She has never used smokeless tobacco. She reports that she does not drink alcohol and does not use drugs.  Family / Support Systems Marital Status: Widow/Widower Patient Roles: Parent, Other (Comment) (grandmother) Children: [name]-"Randy"[phone]  Marilyn-daughter in-law Other Supports: Eber Jones Cooper-granddaughter  [phone] Anticipated Caregiver: Eber Jones and other family members Ability/Limitations of Caregiver: None-was providing 24/7 prior to admission after last CIR admit Caregiver Availability: 24/7 Family Dynamics: Close knit family who will pull together in times of stress and need. Pt was being cared for by granddaughter and son and his family.  Social History Preferred language: English Religion: Baptist Cultural Background: No issues Education: HS Read: Yes Write: Yes Employment Status: Retired Marine scientist Issues: No issues Guardian/Conservator: None-according to MD pt is capable but may have changed with her change in medical status-will include the son who is her next of kin in any decisions while here   Abuse/Neglect Abuse/Neglect Assessment Can Be Completed: Yes Physical Abuse: Denies Verbal Abuse: Denies Sexual Abuse: Denies Exploitation of patient/patient's resources: Denies Self-Neglect: Denies  Emotional Status Pt's affect, behavior and adjustment status: Pt is quite lethargic and only answers questions with one word. She is tired from the weekend and with her medical issues. She has always been able to give 100% and wants to get back to her being mobile like she was prior to admission. Recent Psychosocial Issues: other health issues-was a pt here 03/13/2020 due to CVA Psychiatric History: No history deferred depression screen due to not able to fully assess due to lethargy. Will re-asses while here and have neuro-psych see while here Substance Abuse History: No issues-remote tobacco  Patient / Family Perceptions, Expectations & Goals Pt/Family understanding of illness & functional limitations: Granddaughter can explain her blood clot and MD thought she had another CVA but CT did not show this.  All hope she will do well here and get back to the level she was prior to admission. Premorbid pt/family roles/activities: mom, grandmother, retiree, church member,  etc Anticipated changes in roles/activities/participation: resume Pt/family expectations/goals: Pt states: " I want to do well."  Eber Jones states: " I hope she does well and can be mobile like she was after her stroke."  [employer]: None Premorbid Home Care/DME Agencies: Other (Comment) (Active with [facility] and has wc, rw, bsc, tub seat) Transportation available at discharge: Family transports her  Discharge Planning Living Arrangements: Other relatives Support Systems: Children, Other relatives, Church/faith community Type of Residence: Private residence Insurance Resources: [employer], Media planner (specify) ([employer] Software engineer) Surveyor, quantity Resources: Tree surgeon, Family Support Financial Screen Referred: No Living Expenses: Lives with family Money Management: Family Does the patient have any problems obtaining your medications?: No Home Management: granddaughter although pt had progressed enough to assist with home management Patient/Family Preliminary Plans: Return home with gradndaughter who was providing 24/7 care along with other family members. She is still active with [facility] and will resume upon discharge form rehab. Pt was recently here in Aug 2021 and knows the routine. Care Coordinator Anticipated Follow Up Needs: HH/OP  Clinical Impression Quite lethargic patient who is sleepy and not able to answer questions. Obtained information from Carolyn-granddaughter whom she lives with. Also contacted her son but his VM was full. Will keep both updated regarding pt's progress and aware team conference Wed. Will continue to assess pt.  Lucy Chris 05/15/2020, 11:44 AM

## 2020-05-15 NOTE — IPOC Note (Addendum)
Individualized overall Plan of Care [facility]) Patient Details Name: [name] MRN: [id number] DOB: [birth year]  Admitting Diagnosis: Debility  Hospital Problems: Principal Problem:   Debility Active Problems:   Acute blood loss anemia   Transaminitis   Chronic bilateral low back pain without sciatica     Functional Problem List: Nursing Bladder, Bowel, Medication Management, Skin Integrity  PT Balance, Edema, Endurance, Motor, Pain  OT Balance, Motor, Skin Integrity, Cognition, Pain, Vision, Edema, Perception, Safety, Endurance  SLP Cognition  TR         Basic ADL's: OT Grooming, Bathing, Dressing, Toileting     Advanced  ADL's: OT Simple Meal Preparation     Transfers: PT Bed Mobility, Bed to Chair, Car, Occupational psychologist, Research scientist (life sciences): PT Ambulation, Psychologist, prison and probation services, Stairs     Additional Impairments: OT Fuctional Use of Upper Extremity  SLP Social Cognition   Problem Solving, Memory  TR      Anticipated Outcomes Item Anticipated Outcome  Self Feeding No goal  Swallowing      Basic self-care  Supervision-Min A  Toileting  Min A   Bathroom Transfers Supervision  Bowel/Bladder  mod assist  Transfers  supervision with LRAD  Locomotion  supervision with LRAD  Communication     Cognition  supervision  Pain  less than 3 out of 10  Safety/Judgment  min assist   Therapy Plan: PT Intensity: Minimum of 1-2 x/day ,45 to 90 minutes PT Frequency: 5 out of 7 days PT Duration Estimated Length of Stay: 14-18 days OT Intensity: Minimum of 1-2 x/day, 45 to 90 minutes OT Frequency: 5 out of 7 days OT Duration/Estimated Length of Stay: 16-18 days SLP Intensity: Minumum of 1-2 x/day, 30 to 90 minutes SLP Frequency: 3 to 5 out of 7 days SLP Duration/Estimated Length of Stay: 14 days    Team Interventions: Nursing Interventions Patient/Family Education, Pain Management, Bladder Management, Bowel Management, Medication Management,  Skin Care/Wound Management  PT interventions Ambulation/gait training, DME/adaptive equipment instruction, Psychosocial support, UE/LE Strength taining/ROM, Balance/vestibular training, Functional electrical stimulation, Skin care/wound management, UE/LE Coordination activities, Cognitive remediation/compensation, Functional mobility training, Splinting/orthotics, Firefighter, Neuromuscular re-education, Museum/gallery curator, Wheelchair propulsion/positioning, Discharge planning, Pain management, Therapeutic Activities, Disease management/prevention, Equities trader education, Therapeutic Exercise  OT Interventions Warden/ranger, Community reintegration, Disease mangement/prevention, Equities trader education, Self Care/advanced ADL retraining, Splinting/orthotics, Therapeutic Exercise, UE/LE Coordination activities, Cognitive remediation/compensation, Discharge planning, DME/adaptive equipment instruction, Functional mobility training, Pain management, Psychosocial support, Therapeutic Activities, UE/LE Strength taining/ROM, Visual/perceptual remediation/compensation, Wheelchair propulsion/positioning  SLP Interventions Cognitive remediation/compensation, Cueing hierarchy, Internal/external aids, Patient/family education  TR Interventions    SW/CM Interventions Discharge Planning, Psychosocial Support, Patient/Family Education   Barriers to Discharge MD  Medical stability, Weight and Behavior  Nursing      PT Incontinence poor activity tolerance and generalized weakness  OT Weight, Incontinence    SLP      SW       Team Discharge Planning: Destination: PT-Home ,OT- Home , SLP-Home Projected Follow-up: PT-Home health PT, OT-  Home health OT, SLP-None Projected Equipment Needs: PT-To be determined, OT- To be determined, SLP-None recommended by SLP Equipment Details: PT-pt has RW, OT-  Patient/family involved in discharge planning: PT- Patient,  OT- patient, SLP-Patient  MD  ELOS: 14-17 days. Medical Rehab Prognosis:  Good Assessment: 79 y.o. female with a PMH of DVT/PE, Takatsubo cardiomyopathy, HTN, OSA, CVA, who was admitted syncopal episode and was found to have a submassive PE with saddle emboli and  right heart strain. She received tPA and developed left arm hematoma at the infusion site. She required transfusion for acute blood loss anemia. Anticoagulation was discontinued due to drop in hemoglobin. IVC filter was placed on 05/10/20. Patient with resulting functional deficits with mobility, endurance, transfers, self-care.  Will set goals for Supervision/Min A with PT and Min/Mod A with OT.   Due to the current state of emergency, patients may not be receiving their 3-hours of Medicare-mandated therapy.  See Team Conference Notes for weekly updates to the plan of care

## 2020-05-16 ENCOUNTER — Inpatient Hospital Stay (HOSPITAL_COMMUNITY): Payer: Medicare Other | Admitting: Occupational Therapy

## 2020-05-16 ENCOUNTER — Inpatient Hospital Stay (HOSPITAL_COMMUNITY): Payer: Medicare Other

## 2020-05-16 ENCOUNTER — Encounter (HOSPITAL_COMMUNITY): Payer: Self-pay | Admitting: Physical Medicine & Rehabilitation

## 2020-05-16 DIAGNOSIS — G479 Sleep disorder, unspecified: Secondary | ICD-10-CM

## 2020-05-16 DIAGNOSIS — D72829 Elevated white blood cell count, unspecified: Secondary | ICD-10-CM

## 2020-05-16 DIAGNOSIS — R9431 Abnormal electrocardiogram [ECG] [EKG]: Secondary | ICD-10-CM

## 2020-05-16 MED ORDER — ZOLPIDEM TARTRATE 5 MG PO TABS
2.5000 mg | ORAL_TABLET | Freq: Every evening | ORAL | Status: DC | PRN
  Administered 2020-05-16 – 2020-05-22 (×7): 2.5 mg via ORAL
  Filled 2020-05-16 (×7): qty 1

## 2020-05-16 MED ORDER — TRAZODONE HCL 50 MG PO TABS: 50.0000 mg | ORAL_TABLET | Freq: Every evening | ORAL | Status: DC | PRN

## 2020-05-16 NOTE — Progress Notes (Signed)
Patient refused CPAP for tonight. RT instructed patient to have RT called if she changes her mind. RT will monitor as needed. ?

## 2020-05-16 NOTE — Progress Notes (Signed)
Occupational Therapy Session Note  Patient Details  Name: [name] MRN: [id number] Date of Birth: 06-20-1941  Today's Date: 05/16/2020 OT Individual Time: 6761-9509 OT Individual Time Calculation (min): 53 min    Short Term Goals: Week 1:  OT Short Term Goal 1 (Week 1): Pt will complete UB bathing with Mod A OT Short Term Goal 2 (Week 1): Pt will complete 1 grooming task while sitting at the sink with mod cuing for locating items in Rt visual field OT Short Term Goal 3 (Week 1): Pt will thread 1 LE into pants with Mod A  Skilled Therapeutic Interventions/Progress Updates:    Treatment session with focus on LUE NMR and edema management.  Pt received semi-reclined in bed reporting not sleeping overnight and requesting to not get OOB due to fatigue.  Therapist followed up with PA regarding edema in LUE.  Marissa Nestle, PA, reports okay to apply kinesiotape or even compression to LUE for edema.  Therapist educated pt on purpose of kinesiotape to facilitate movement of fluid and decrease swelling in LUE.  Pt agreeable to application of kinesiotape.  Engaged in gentle PROM to LUE prior to application of kinesiotape with pt grimacing with shoulder flexion and elbow extension.  Applied kinesiotape to Lt forearm and upper arm to location of bruising with small strips to facilitate movement of fluid to decrease swelling.  Pt remained semi-reclined in bed with LUE elevated on pillows and all needs in reach.  Therapy Documentation Precautions:  Precautions Precautions: Fall Restrictions Weight Bearing Restrictions: No Pain: Pain Assessment Pain Scale: 0-10 Pain Score: 0-No pain   Therapy/Group: Individual Therapy  Rosalio Loud 05/16/2020, 12:07 PM

## 2020-05-16 NOTE — Progress Notes (Signed)
[facility] PHYSICAL MEDICINE & REHABILITATION PROGRESS NOTE   Subjective/Complaints: Patient seen sitting up in bed this morning.  She states she did not sleep well overnight, and wants medication to help her sleep.  ROS: Denies CP, SOB, N/V/D  Objective:   CT HEAD CODE STROKE WO CONTRAST`  Result Date: 05/14/2020 CLINICAL DATA:  Code stroke. Delirium. Assess for stroke. Lethargy and slurred speech. EXAM: CT HEAD WITHOUT CONTRAST TECHNIQUE: Contiguous axial images were obtained from the base of the skull through the vertex without intravenous contrast. COMPARISON:  CT head May 05, 2020 FINDINGS: Brain: No acute intracranial hemorrhage, mass effect, or midline shift. Similar patchy white matter hypoattenuation, likely secondary to chronic microvascular ischemic disease. No evidence of acute large vascular territory infarct. 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 No hydrocephalus. No mass effect. No mass lesion. Vascular: Calcific atherosclerosis. Skull: Normal. Negative for fracture or focal lesion. Sinuses/Orbits: No acute finding. Other: No mastoid effusions. ASPECTS [facility] Stroke Program Early CT Score) Total score (0-10 with 10 being normal): 10 IMPRESSION: 1. No evidence of acute intracranial abnormality. ASPECTS is 10 2. 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 3. Similar chronic microvascular ischemic disease. Code stroke imaging results were communicated on 05/14/2020 at 1:17 pm to provider Dr. Thomasena Edis via telephone, who verbally acknowledged these results. Electronically Signed   By: Feliberto Harts MD   On: 05/14/2020 13:21   CT ANGIO HEAD CODE STROKE  Result Date: 05/14/2020 CLINICAL DATA:  Stroke follow-up. Altered mental status. Known pulmonary embolus. EXAM: CT ANGIOGRAPHY HEAD AND NECK TECHNIQUE: Multidetector CT imaging of the head and neck was performed using the standard protocol during bolus administration of intravenous contrast.  Multiplanar CT image reconstructions and MIPs were obtained to evaluate the vascular anatomy. Carotid stenosis measurements (when applicable) are obtained utilizing NASCET criteria, using the distal internal carotid diameter as the denominator. CONTRAST:  47mL OMNIPAQUE IOHEXOL 350 MG/ML SOLN COMPARISON:  CTA 02/16/2020 FINDINGS: CTA NECK FINDINGS Aortic arch: Aortic atherosclerosis. Great vessel origins are patent. Right carotid system: Calcified plaque at the bifurcation without significant stenosis. Mild irregularity of the internal carotid artery at the skull base, similar to prior. Left carotid system: Calcified plaque at the bifurcation without significant stenosis. Mild irregularity of the internal carotid artery at the skull base, similar to prior. Vertebral arteries: Mildly left dominant. Mild calcific atherosclerosis at the right origin. No evidence of significant stenosis or occlusion. Skeleton: Degenerative changes of the cervical spine. Other neck: No mass or adenopathy. Upper chest: Large bilateral acute pulmonary embolus, better characterized on recent CTA chest Review of the MIP images confirms the above findings CTA HEAD FINDINGS Anterior circulation: No significant stenosis, proximal occlusion, aneurysm, or vascular malformation. Posterior circulation: The basilar artery is patent. Bilateral posterior cerebral arteries are patent and appear similar to prior. Tortuous basilar artery. Left fetal type PCA. No evidence of significant (greater than 50%) stenosis or occlusion. Similar appearance relative to prior. Venous sinuses: As permitted by contrast timing, patent. Review of the MIP images confirms the above findings IMPRESSION: 1. No evidence of large vessel occlusion or hemodynamically significant stenosis. 2. Large bilateral acute pulmonary embolus, better characterized on recent CTA chest Code stroke imaging results were communicated on 05/14/2020 at 1:42 pm to provider Dr. Thomasena Edis via telephone,  who verbally acknowledged these results. Electronically Signed   By: Feliberto Harts MD   On: 05/14/2020 13:43   CT ANGIO NECK CODE STROKE  Result Date: 05/14/2020 CLINICAL DATA:  Stroke follow-up. Altered mental status. Known pulmonary embolus. EXAM: CT ANGIOGRAPHY HEAD AND NECK TECHNIQUE: Multidetector CT imaging of the head and neck was performed using the standard protocol during bolus administration of intravenous contrast. Multiplanar CT image reconstructions and MIPs were obtained to evaluate the vascular anatomy. Carotid stenosis measurements (when applicable) are obtained utilizing NASCET criteria, using the distal internal carotid diameter as the denominator. CONTRAST:  80mL OMNIPAQUE IOHEXOL 350 MG/ML SOLN COMPARISON:  CTA 02/16/2020 FINDINGS: CTA NECK FINDINGS Aortic arch: Aortic atherosclerosis. Great vessel origins are patent. Right carotid system: Calcified plaque at the bifurcation without significant stenosis. Mild irregularity of the internal carotid artery at the skull base, similar to prior. Left carotid system: Calcified plaque at the bifurcation without significant stenosis. Mild irregularity of the internal carotid artery at the skull base, similar to prior. Vertebral arteries: Mildly left dominant. Mild calcific atherosclerosis at the right origin. No evidence of significant stenosis or occlusion. Skeleton: Degenerative changes of the cervical spine. Other neck: No mass or adenopathy. Upper chest: Large bilateral acute pulmonary embolus, better characterized on recent CTA chest Review of the MIP images confirms the above findings CTA HEAD FINDINGS Anterior circulation: No significant stenosis, proximal occlusion, aneurysm, or vascular malformation. Posterior circulation: The basilar artery is patent. Bilateral posterior cerebral arteries are patent and appear similar to prior. Tortuous basilar artery. Left fetal type PCA. No evidence of significant (greater than 50%) stenosis or  occlusion. Similar appearance relative to prior. Venous sinuses: As permitted by contrast timing, patent. Review of the MIP images confirms the above findings IMPRESSION: 1. No evidence of large vessel occlusion or hemodynamically significant stenosis. 2. Large bilateral acute pulmonary embolus, better characterized on recent CTA chest Code stroke imaging results were communicated on 05/14/2020 at 1:42 pm to provider Dr. Thomasena Edis via telephone, who verbally acknowledged these results. Electronically Signed   By: Feliberto Harts MD   On: 05/14/2020 13:43   Recent Labs    05/14/20 1408  WBC 10.6*  HGB 10.3*  HCT 33.3*  PLT 185   Recent Labs    05/14/20 1408  NA 139  K 4.2  CL 104  CO2 24  GLUCOSE 96  BUN 11  CREATININE 0.73  CALCIUM 8.9    Intake/Output Summary (Last 24 hours) at 05/16/2020 1056 Last data filed at 05/16/2020 0757 Gross per 24 hour  Intake 657 ml  Output --  Net 657 ml        Physical Exam: Vital Signs Blood pressure 127/61, pulse (!) 57, temperature 98.4 F (36.9 C), temperature source Oral, resp. rate 15, height 5\' 2"  (1.575 m), weight 93 kg, SpO2 95 %. Constitutional: No distress . Vital signs reviewed. HENT: Normocephalic.  Atraumatic. Eyes: EOMI. No discharge. Cardiovascular: No JVD.  RRR. Respiratory: Normal effort.  No stridor.  Bilateral clear to auscultation. GI: Non-distended.  BS +. Skin: Warm and dry.  Intact. Psych: Slightly hyperactive. Musc: Left upper extremity edema and mild tenderness. Neuro: Alert and oriented x3 Motor: Left upper extremity: Shoulder abduction, elbow flexion/extension 1+/5, handgrip 1/5 Left lower extremity: 4/5 proximal to distal Right upper extremity: 4/5 proximal distal  Assessment/Plan: 1. Functional deficits secondary to Debility following submassive PE with septic emboli and right heart strain, which require 3+ hours per day of interdisciplinary therapy in a comprehensive inpatient rehab setting.  Physiatrist  is providing close team supervision and 24 hour management of active medical problems listed below.  Physiatrist and rehab team continue to assess barriers to discharge/monitor patient progress toward  functional and medical goals  Care Tool:  Bathing        Body parts bathed by helper: Right arm, Left arm, Chest, Abdomen, Front perineal area, Buttocks, Right upper leg, Left upper leg, Left lower leg, Face, Right lower leg     Bathing assist Assist Level: Total Assistance - Patient < 25% (using Stedy lift for sit<stand)     Upper Body Dressing/Undressing Upper body dressing   What is the patient wearing?: Pull over shirt    Upper body assist Assist Level: Total Assistance - Patient < 25%    Lower Body Dressing/Undressing Lower body dressing      What is the patient wearing?: Incontinence brief     Lower body assist Assist for lower body dressing: 2 Helpers     Toileting Toileting    Toileting assist Assist for toileting: 2 Helpers     Transfers Chair/bed transfer  Transfers assist     Chair/bed transfer assist level: Moderate Assistance - Patient 50 - 74%     Locomotion Ambulation   Ambulation assist      Assist level: 2 helpers Assistive device: Walker-rolling Max distance: 49ft   Walk 10 feet activity   Assist     Assist level: 2 helpers Assistive device: Walker-rolling   Walk 50 feet activity   Assist Walk 50 feet with 2 turns activity did not occur: Safety/medical concerns (fatigue, generalized weakness, decreased balance, pain)         Walk 150 feet activity   Assist Walk 150 feet activity did not occur: Safety/medical concerns (fatigue, generalized weakness, decreased balance, pain)         Walk 10 feet on uneven surface  activity   Assist Walk 10 feet on uneven surfaces activity did not occur: Safety/medical concerns (fatigue, generalized weakness, decreased balance, pain)         Wheelchair     Assist Will  patient use wheelchair at discharge?: Yes Type of Wheelchair: Manual    Wheelchair assist level: Supervision/Verbal cueing Max wheelchair distance: 81ft    Wheelchair 50 feet with 2 turns activity    Assist        Assist Level: Total Assistance - Patient < 25%   Wheelchair 150 feet activity     Assist      Assist Level: Total Assistance - Patient < 25%   Blood pressure 127/61, pulse (!) 57, temperature 98.4 F (36.9 C), temperature source Oral, resp. rate 15, height 5\' 2"  (1.575 m), weight 93 kg, SpO2 95 %.  Medical Assessment/Plan: 1.Debility following submassive PE with septic emboli and right heart strain.   Continue CIR   CT Head negative, appreciate neurology eval.  2. Antithrombotics: -DVT/anticoagulation:IVC filter -antiplatelet therapy: N/A 3. Pain Management:oxycodone PRN for low back pain. Continue heating pad and lidocaine patch  Appears controlled with meds on 10/5 4. Mood:Provide emotional support. Denies depression but would like to continue on Buspar.  -antipsychotic agents: N/A 5. Neuropsych: This patientisnot fully capable of making decisions on herown behalf. 6. Skin/Wound Care:continue warm compress to right hematoma 7. Fluids/Electrolytes/Nutrition:Routine in and outs. 8. Dementia: Continue Aricept and Memantine 9. Right hip cellulitis: continue completed course of Keflex  Vancomycin DC'd 10.  Constipation: Has BM every three days. Continue Linzess, Senna-docusate HS 11.  Sleep disturbance: Continue Melatonin HS.   Avoid trazodone given #17  Ambien 2.5 nightly as needed ordered on 10/5  Appears to be improving 12. Dry eyes: Continue drops.  13.  Hypoalbuminemia  Supplement initiated on  10/4 14.  Mild transaminitis  AST elevated on 10/3, continue to monitor 15.  Leukocytosis  WBCs 10.6 on 10/3  Afebrile  See #9  Continue to monitor 16.  Acute blood loss anemia  Hemoglobin 10.3 on 10/3  Continue to  monitor 17.  Prolonged QTc  ECG reviewed from 9/29 showing severe prolongation in QT  Repeat ECG ordered   LOS: 3 days A FACE TO FACE EVALUATION WAS PERFORMED  Kristle Wesch [name] 05/16/2020, 10:56 AM

## 2020-05-16 NOTE — Progress Notes (Signed)
Physical Therapy Session Note  Patient Details  Name: [name] MRN: [id number] Date of Birth: [birth year]  Today's Date: 05/16/2020 PT Individual Time: 6195-0932 and 1330-1411  PT Missed Time: 19 minutes due to pt fatigue PT Individual Time Calculation (min): 38 min and 41 min  Short Term Goals: Week 1:  PT Short Term Goal 1 (Week 1): Pt will perform bed mobility with mod A consistantly PT Short Term Goal 2 (Week 1): Pt will transfer sit<>stand with LRAD min A PT Short Term Goal 3 (Week 1): Pt will perform simulated car transfer with RW and mod A  Skilled Therapeutic Interventions/Progress Updates:   Treatment Session 1: 6712-4580 38 min Received pt supine in bed immediately stating "we don't have to go out of the room do we? I had a horrible night last night". Pt stated she didn't sleep at all last night but didn't know why; denied pain as cause. Pt denied any pain during session. Therapist suggested standing exercises EOB but pt stated "I really don't think I can stand right now". Therapist suggested bed level exercises and pt agreeable. Pt performed the following exercises supine in bed with supervision and verbal cues for technique: -ankle circles with LE supported on pillow to increased ROM x20 clockwise/counterclockwise bilaterally  -heel slides 2x12 bilaterally  -hip abduction 2x12 bilaterally -hip adduction pillow squeezes 2x15 -overhead chest press with R UE and orange TB 2x9 -horizontal chest press with R UE and orange TB 2x9 Pt required multiple rest breaks throughout session due to increased fatigue. Concluded session with pt supine in bed, needs within reach, and bed alarm on. Therapist provided fresh drink for pt.   Treatment Session 2: 1330-1411 Received pt supine in bed, pt agreeable to therapy, and reported 7/10 pain in L UE. Repositioning and rest breaks given to reduce pain levels. Pt initially agreeable to getting OOB into WC but when therapist brought  pants to pt to get dressed pt stated "do we have to go anywhere I didn't sleep at all last night". Therapist suggested OOB for improved positioning and activity tolerance but pt politely declined stating "can we please do something in the bed?" Pt rolled supine<>R sidelying with mod A and therapist educated pt on rolling in bed for pressure relief purposes; pt verbalized understanding. In R sidelying pt performed the following exercises with verbal/tactile cues for technique: -R sidelying clamshells 2x12 Attempted reverse clamshells but pt reported increased pain in low back and stopped exercise.  -R sidelying active assisted hip abduction 2x10 -R sidelying active assisted hip extension 2x10 Pt reported her UE felt better after stretching with OT this morning. Engaged in light PROM to L UE into elbow extension and shoulder abduction. Pt reported increased pain with end range elbow extension and with 75 degrees of shoulder abduction. Pt required +2 assist to scoot to [facility]. Concluded session with pt supine in bed, needs within reach, and bed alarm on. Therapist assisted with positioning L UE on pillow for improved positioning and edema management and RN present at bedside administering medications. 19 minutes missed of skilled physical therapy due to pt fatigue.   Therapy Documentation Precautions:  Precautions Precautions: Fall Restrictions Weight Bearing Restrictions: No   Therapy/Group: Individual Therapy Martin Majestic PT, DPT   05/16/2020, 7:29 AM

## 2020-05-16 NOTE — Progress Notes (Signed)
Speech Language Pathology Daily Session Note  Patient Details  Name: [name] MRN: [id number] Date of Birth: 1941/04/22  Today's Date: 05/16/2020 SLP Individual Time: 1502-1530 SLP Individual Time Calculation (min): 28 min  Short Term Goals: Week 1: SLP Short Term Goal 1 (Week 1): Pt will utilize external memory aids to facilitate recall of daily information with min assist. SLP Short Term Goal 2 (Week 1): Pt will direct her care during basic tasks with min assist.  Skilled Therapeutic Interventions:Skilled ST services focused on cognitive skills. Pt demonstrated recall of today's events with min A verbal cues, although events were limited due to pt being too tried to participate fully in therapeutic services. Pt was unable to recall events from yesterday, but with supervision verbal cues utilized [employer] SLP instructed pt on recall strategy WRAP (write, repeat, association, picture.) Pt demonstrated ability to recall 0/4 words, 4/4 with max category/choice cues, following use of visualization and repetition strategies pt demonstrated recall of 2 out 4 words with delayed recall. Pt was left in room with call bell within reach and bed alarm set. SLP recommends to continue skilled services.     Pain Pain Assessment Pain Score: 0-No pain  Therapy/Group: Individual Therapy  Rayma Hegg  [facility] 05/16/2020, 3:46 PM

## 2020-05-16 NOTE — Progress Notes (Signed)
Patient awake for most of the night. Patient kept asking for "something for sleep," but no PRNs available. Jesusita Oka, PA notified regarding patient's request for sleeping medication. New orders for Trazodone 50mg  qHS PRN.

## 2020-05-17 ENCOUNTER — Inpatient Hospital Stay (HOSPITAL_COMMUNITY): Payer: Medicare Other

## 2020-05-17 ENCOUNTER — Inpatient Hospital Stay (HOSPITAL_COMMUNITY): Payer: Medicare Other | Admitting: Occupational Therapy

## 2020-05-17 NOTE — Progress Notes (Signed)
Patient ID: [name], female   DOB: 15-Aug-1940, 79 y.o.   MRN: [id number]  Met with pt and spoke with son via telephone to discuss team conference goals of min assist and target discharge 10/23. He voiced she will coming home with him this time and she will have 24 hr care. [facility] will resume services and will work on any other equipment needs. Aware will need to come in prior to discharge for education. Work on discharge needs.

## 2020-05-17 NOTE — Patient Care Conference (Signed)
Inpatient RehabilitationTeam Conference and Plan of Care Update Date: 05/17/2020   Time:   11:40 AM   Patient Name: [name]      Medical Record Number: [id number]  Date of Birth: 1940/11/24 Sex: Female         Room/Bed: 4W08C/4W08C-01 Payor Info: Payor: MEDICARE / Plan: MEDICARE PART A AND B / Product Type: *No Product type* /    Admit Date/Time:  05/13/2020  3:23 PM  Primary Diagnosis:  Debility  Hospital Problems: Principal Problem:   Debility Active Problems:   Acute blood loss anemia   Transaminitis   Chronic bilateral low back pain without sciatica   Prolonged Q-T interval on ECG    Expected Discharge Date: Expected Discharge Date: 06/03/20  Team Members Present: Physician leading conference: Dr. Sula Soda Care Coodinator Present: Chana Bode, RN, BSN, CRRN;Other (comment) Kriste Basque Dupree, SW) Nurse Present: Willey Blade, RN PT Present: Raechel Chute, PT OT Present: Rosalio Loud, OT SLP Present: Colin Benton, SLP PPS Coordinator present : Fae Pippin, SLP     Current Status/Progress Goal Weekly Team Focus  Bowel/Bladder   incont b/b  bowel/bladderprogram  q 2 hr. toileting/prn   Swallow/Nutrition/ Hydration             ADL's   mod-max assist bed mobility and transfers, total assist bathing and dressing, pt with pain and swelling in LUE limiting participation in self-care tasks  Min A  ADL retraining, edema management, transfers, sit > stand   Mobility   bed mobility mod/max A, transfers mod/max A, gait 47ft with RW +2 assist, WC mobility 106ft supervision  supervision, CGA car transfer  functional mobility/transfers, generalized strengthening, dynamic standing balance/coordination, ambulation, and endurance   Communication             Safety/Cognition/ Behavioral Observations  Min A  Supervision A  memory notebook and driecting care   Pain   complaits of pain right arm  pain will maintain less than 3  assess pain q shift/prn   Skin    hematoma left arm  free of infection/further breakdown  assess q shift/prn     Discharge Planning:  Plan to return home with granddaughter who was providing care prior to admission. Son and other family members assist. Made aware will need 24/7 care at DC   Team Discussion: TIA 05/14/20; hematoma in left UE with kinesio tape applied for swelling. Continue to note issues with participation and posterior lean affecting progress. Note fluctuating status of good and bad days and requires constant cues. Poor memory and attention requires visual aides for memory and use of strategies. Incontinent of B+B. Patient on target to meet rehab goals: yes, supervision goals set  *See Care Plan and progress notes for long and short-term goals.   Revisions to Treatment Plan:  Add Rec therapy  Teaching Needs: Memory strategies, transfers, toileting, medications, etc  Current Barriers to Discharge: Caregiver support, incontinence  Possible Resolutions to Barriers: Family education    Medical Summary Current Status: Extensive posterior lean, fluctuating ability to follow commands, impaired memory, hypotensive  Barriers to Discharge: Medical stability;Decreased family/caregiver support  Barriers to Discharge Comments: Impaired cognition, left arm hematoma, low back pain, impaired memory and attention Possible Resolutions to [employer] Focus: warm compress to left arm TID, kpad as needed for low back pain, 24/7 supervision given her recent TIA and fluctuating mental status   Continued Need for Acute Rehabilitation Level of Care: The patient requires daily medical management by a physician with specialized  training in physical medicine and rehabilitation for the following reasons: Direction of a multidisciplinary physical rehabilitation program to maximize functional independence : Yes Medical management of patient stability for increased activity during participation in an intensive rehabilitation  regime.: Yes Analysis of laboratory values and/or radiology reports with any subsequent need for medication adjustment and/or medical intervention. : Yes   I attest that I was present, lead the team conference, and concur with the assessment and plan of the team.   Chana Bode B 05/17/2020, 1:40 PM

## 2020-05-17 NOTE — Progress Notes (Signed)
Physical Therapy Session Note  Patient Details  Name: [name] MRN: [id number] Date of Birth: 06-21-1941  Today's Date: 05/17/2020 PT Individual Time: 1345-1439 PT Individual Time Calculation (min): 54 min   Short Term Goals: Week 1:  PT Short Term Goal 1 (Week 1): Pt will perform bed mobility with mod A consistantly PT Short Term Goal 2 (Week 1): Pt will transfer sit<>stand with LRAD min A PT Short Term Goal 3 (Week 1): Pt will perform simulated car transfer with RW and mod A  Skilled Therapeutic Interventions/Progress Updates:   Received pt supine in bed, pt agreeable to therapy, and reported pain L UE but did not state pain level and declined pain medications. Repositioning and rest breaks done to reduce pain levels. Session with emphasis on functional mobility/transfers, generalized strengthening, dynamic standing balance/coordination, simulated car transfers, and improved activity tolerance. Pt transferred supine<>sitting EOB with mod A with HOB elevated and use of bedrails. Pt able to maintain static sitting balance initially with mod A fading to close supervision but required max A to scoot R hip to EOB. Pt transferred stand<>pivot bed<>WC with RW and mod A with increased time. Pt transported to ortho gym in [facility] total A for energy conservation purposes and performed simulated car transfer with RW and max A. Pt required max A for LE management and upon sitting in car, pt with L lateral lean and poor sitting balance resulting in pt essentially lying down in the car. Pt required mod A to correct posture and extensive rest break prior to exiting the car. Pt stated "why do I need to do this?" and therapist educated pt on purpose of practicing in preparation for discharge and to improve independence with mobility so she doesn't have to rely on her son and daughter in law as much for assist. Pt requested to return to room and get into bed and was transported back to room in [facility] total A. Pt  transferred stand<>pivot WC<>bed with RW mod A and sit<>supine with max A for LE management. Concluded session with pt supine in bed, needs within reach, and bed alarm on. RN present to administer medications and therapist assisted with positioning L UE on pillows for comfort and edema management.   Therapy Documentation Precautions:  Precautions Precautions: Fall Restrictions Weight Bearing Restrictions: No   Therapy/Group: Individual Therapy Martin Majestic PT, DPT   05/17/2020, 7:24 AM

## 2020-05-17 NOTE — Progress Notes (Signed)
[place] PHYSICAL MEDICINE & REHABILITATION PROGRESS NOTE   Subjective/Complaints: Initially she has no complaints. Says she is not sure how she is doing. When asked, she says left arm is hurting. Says she has not been receiving warm compress- order placed.   ROS: Denies low back pain  Objective:   No results found. Recent Labs    05/14/20 1408  WBC 10.6*  HGB 10.3*  HCT 33.3*  PLT 185   Recent Labs    05/14/20 1408  NA 139  K 4.2  CL 104  CO2 24  GLUCOSE 96  BUN 11  CREATININE 0.73  CALCIUM 8.9    Intake/Output Summary (Last 24 hours) at 05/17/2020 0857 Last data filed at 05/17/2020 0847 Gross per 24 hour  Intake 438 ml  Output --  Net 438 ml        Physical Exam: Vital Signs Blood pressure (!) 116/59, pulse 63, temperature 98.5 F (36.9 C), temperature source Oral, resp. rate 18, height 5\' 2"  (1.575 m), weight 93 kg, SpO2 97 %. General: Alert, No apparent distress HEENT: Head is normocephalic, atraumatic, PERRLA, EOMI, sclera anicteric, oral mucosa pink and moist, dentition intact, ext ear canals clear,  Neck: Supple without JVD or lymphadenopathy Heart: Reg rate and rhythm. No murmurs rubs or gallops Chest: CTA bilaterally without wheezes, rales, or rhonchi; no distress Abdomen: Soft, non-tender, non-distended, bowel sounds positive. Extremities: No clubbing, cyanosis, or edema. Pulses are 2+ Skin: Clean and intact without signs of breakdown Psych: Alert and oriented x3 (off one day with date) Musc: Left upper extremity edema and mild tenderness. Neuro: Alert and oriented x3 Motor: Left upper extremity: Shoulder abduction, elbow flexion/extension 1+/5, handgrip 1/5 Left lower extremity: 4/5 proximal to distal Right upper extremity: 4/5 proximal distal Very poor truncal support   Assessment/Plan: 1. Functional deficits secondary to Debility following submassive PE with septic emboli and right heart strain, which require 3+ hours per day of  interdisciplinary therapy in a comprehensive inpatient rehab setting.  Physiatrist is providing close team supervision and 24 hour management of active medical problems listed below.  Physiatrist and rehab team continue to assess barriers to discharge/monitor patient progress toward functional and medical goals  Care Tool:  Bathing        Body parts bathed by helper: Right arm, Left arm, Chest, Abdomen, Front perineal area, Buttocks, Right upper leg, Left upper leg, Left lower leg, Face, Right lower leg     Bathing assist Assist Level: Total Assistance - Patient < 25% (using Stedy lift for sit<stand)     Upper Body Dressing/Undressing Upper body dressing   What is the patient wearing?: Pull over shirt    Upper body assist Assist Level: Total Assistance - Patient < 25%    Lower Body Dressing/Undressing Lower body dressing      What is the patient wearing?: Incontinence brief     Lower body assist Assist for lower body dressing: 2 Helpers     Toileting Toileting    Toileting assist Assist for toileting: 2 Helpers     Transfers Chair/bed transfer  Transfers assist     Chair/bed transfer assist level: Moderate Assistance - Patient 50 - 74%     Locomotion Ambulation   Ambulation assist      Assist level: 2 helpers Assistive device: Walker-rolling Max distance: 64ft   Walk 10 feet activity   Assist     Assist level: 2 helpers Assistive device: Walker-rolling   Walk 50 feet activity   Assist  Walk 50 feet with 2 turns activity did not occur: Safety/medical concerns (fatigue, generalized weakness, decreased balance, pain)         Walk 150 feet activity   Assist Walk 150 feet activity did not occur: Safety/medical concerns (fatigue, generalized weakness, decreased balance, pain)         Walk 10 feet on uneven surface  activity   Assist Walk 10 feet on uneven surfaces activity did not occur: Safety/medical concerns (fatigue, generalized  weakness, decreased balance, pain)         Wheelchair     Assist Will patient use wheelchair at discharge?: Yes Type of Wheelchair: Manual    Wheelchair assist level: Supervision/Verbal cueing Max wheelchair distance: 5ft    Wheelchair 50 feet with 2 turns activity    Assist        Assist Level: Total Assistance - Patient < 25%   Wheelchair 150 feet activity     Assist      Assist Level: Total Assistance - Patient < 25%   Blood pressure (!) 116/59, pulse 63, temperature 98.5 F (36.9 C), temperature source Oral, resp. rate 18, height 5\' 2"  (1.575 m), weight 93 kg, SpO2 97 %.  Medical Assessment/Plan: 1.Debility following submassive PE with septic emboli and right heart strain.   Continue CIR   CT Head negative, appreciate neurology eval.  2. Antithrombotics: -DVT/anticoagulation:IVC filter -antiplatelet therapy: N/A 3. Pain Management:oxycodone PRN for low back pain. Continue heating pad and lidocaine patch  Back pain well controlled, left arm hematoma hurting- Sarah OT to use kinesiology tape and asked nurse to elevate, I placed order for warm compress TID.  4. Mood:Provide emotional support. Denies depression but would like to continue on Buspar.  -antipsychotic agents: N/A 5. Neuropsych: This patientisnot fully capable of making decisions on herown behalf. 6. Skin/Wound Care:continue warm compress to right hematoma 7. Fluids/Electrolytes/Nutrition:Routine in and outs. 8. Dementia: Continue Aricept and Memantine. Placed order for recreational therapy to bring in board games, which she enjoys.  9. Right hip cellulitis: continue completed course of Keflex  Vancomycin DC'd 10.  Constipation: Has BM every three days. Continue Linzess, Senna-docusate HS 11.  Sleep disturbance: Continue Melatonin HS.   Avoid trazodone given #17  Ambien 2.5 nightly as needed ordered on 10/5  10/6: slept better last night  Appears to be  improving 12. Dry eyes: Continue drops.  13.  Hypoalbuminemia  Supplement initiated on 10/4 14.  Mild transaminitis  AST elevated on 10/3, continue to monitor 15.  Leukocytosis  WBCs 10.6 on 10/3  Afebrile  See #9  Continue to monitor 16.  Acute blood loss anemia  Hemoglobin 10.3 on 10/3  Continue to monitor 17.  Prolonged QTc  ECG reviewed from 9/29 showing severe prolongation in QT  Repeat ECG 10/5 shows T wave inversion, QTc much better   LOS: 4 days A FACE TO FACE EVALUATION WAS PERFORMED  10/29 P Hendy Brindle 05/17/2020, 8:57 AM

## 2020-05-17 NOTE — Progress Notes (Addendum)
Speech Language Pathology Daily Session Note  Patient Details  Name: [name] MRN: [id number] Date of Birth: 1941/06/01  Today's Date: 05/17/2020 SLP Individual Time: 1100-1130 SLP Individual Time Calculation (min): 30 min  Short Term Goals: Week 1: SLP Short Term Goal 1 (Week 1): Pt will utilize external memory aids to facilitate recall of daily information with min assist. SLP Short Term Goal 2 (Week 1): Pt will direct her care during basic tasks with min assist.  Skilled Therapeutic Interventions:Skilled ST services focused on cognitive skills. Pt unable to recall AM events with OT required supervision A verbal cues to recall memory notebook. Pt required supervision A verbal cues utilize memory notebook to recall WRAP recall strategies from yesterday's and required mod A verbal cues to recall meaning of acronym. After instruction of visualization and repetition pt demonstrated ability to recall meaning of acronym with min A verbal cues following 2 minute delay and mod I with 3 then 5 minute delay. Pt required mod a verbal cues to recall facts following short stories. SLP instructed pt in visualization and association strategies in recall visual images with ability to recall given 5 minute delay min A verbal cues. Pt was left in room with call bell within reach and bed alarm set. SLP recommends to continue skilled services.  As of note in conference OT noted deficits in sustained attention. SLP will look at cognitive skills overall especially attention in the next session.      Pain Pain Assessment Pain Score: 2   Therapy/Group: Individual Therapy  Maddalynn Barnard  [facility] 05/17/2020, 7:57 AM

## 2020-05-17 NOTE — Progress Notes (Signed)
Occupational Therapy Session Note  Patient Details  Name: [name] MRN: [id number] Date of Birth: [birth year]  Today's Date: 05/17/2020 OT Individual Time: 2706-2376 OT Individual Time Calculation (min): 55 min    Short Term Goals: Week 1:  OT Short Term Goal 1 (Week 1): Pt will complete UB bathing with Mod A OT Short Term Goal 2 (Week 1): Pt will complete 1 grooming task while sitting at the sink with mod cuing for locating items in Rt visual field OT Short Term Goal 3 (Week 1): Pt will thread 1 LE into pants with Mod A  Skilled Therapeutic Interventions/Progress Updates:    Treatment session with focus on self-care retraining, bed mobility, sitting balance, and sustained attention to task.  Pt received in bed with nurse tech present feeding pt.  Encouraged pt to attempt to utilize RUE to engage in self-feeding and self-care tasks as pt with good movement in RUE.  Pt completed bed mobility with mod-max assist for rolling with max cues for sequencing.  Engaged in UB bathing seated at EOB with pt requiring CGA to max assist due to fatigue and decreased sustained attention to task.  Pt required max cues for upright posture and sequencing to engage in aspects of bathing.  Pt able to reach into basin to retreive wash cloth and washed chest and face.  Pt with increased pushing posteriorly as she fatigued.  Returned to bed max assist with cues for sequencing. Donned pants at bed level total assist with focus on rolling and sequencing of dressing.  Pt with increased success with boosting hips from bed to allow for therapist to pull pants over hips vs rolling Rt and Lt.  Pt positioned upright in bed with pillows under LUE for edema management.  Therapy Documentation Precautions:  Precautions Precautions: Fall Restrictions Weight Bearing Restrictions: No Pain: Pt reports pain in Lt arm with movement.  Repositioned and provided support during sitting and when in supine.  Therapy/Group:  Individual Therapy  Rosalio Loud 05/17/2020, 10:57 AM

## 2020-05-18 ENCOUNTER — Inpatient Hospital Stay (HOSPITAL_COMMUNITY): Payer: Medicare Other | Admitting: Occupational Therapy

## 2020-05-18 ENCOUNTER — Inpatient Hospital Stay (HOSPITAL_COMMUNITY): Payer: Medicare Other | Admitting: Speech Pathology

## 2020-05-18 ENCOUNTER — Inpatient Hospital Stay (HOSPITAL_COMMUNITY): Payer: Medicare Other

## 2020-05-18 DIAGNOSIS — K5901 Slow transit constipation: Secondary | ICD-10-CM

## 2020-05-18 DIAGNOSIS — T148XXA Other injury of unspecified body region, initial encounter: Secondary | ICD-10-CM

## 2020-05-18 LAB — CBC
HCT: 30.3 % — ABNORMAL LOW (ref 36.0–46.0)
Hemoglobin: 9.2 g/dL — ABNORMAL LOW (ref 12.0–15.0)
MCH: 30.1 pg (ref 26.0–34.0)
MCHC: 30.4 g/dL (ref 30.0–36.0)
MCV: 99 fL (ref 80.0–100.0)
Platelets: 198 10*3/uL (ref 150–400)
RBC: 3.06 MIL/uL — ABNORMAL LOW (ref 3.87–5.11)
RDW: 15 % (ref 11.5–15.5)
WBC: 9.5 10*3/uL (ref 4.0–10.5)
nRBC: 0 % (ref 0.0–0.2)

## 2020-05-18 NOTE — Plan of Care (Signed)
  Problem: RH Problem Solving Goal: LTG Patient will demonstrate problem solving for (SLP) Description: LTG:  Patient will demonstrate problem solving for basic/complex daily situations with cues  (SLP) Flowsheets (Taken 05/18/2020 1053) LTG: Patient will demonstrate problem solving for (SLP): (semi-complex) Other (comment) LTG Patient will demonstrate problem solving for: Supervision Note: Goal initiated on 05/18/20 after reassessment of cognition/new deficits noted   Problem: RH Attention Goal: LTG Patient will demonstrate this level of attention during functional activites (SLP) Description: LTG:  Patient will will demonstrate this level of attention during functional activites (SLP) Flowsheets (Taken 05/18/2020 1053) Patient will demonstrate during cognitive/linguistic activities the attention type of: Selective Patient will demonstrate this level of attention during cognitive/linguistic activities in: Controlled LTG: Patient will demonstrate this level of attention during cognitive/linguistic activities with assistance of (SLP): Supervision Number of minutes patient will demonstrate attention during cognitive/linguistic activities: 15 Note: Goal initiated on 05/18/20 after reassessment of cognition/new deficits noted

## 2020-05-18 NOTE — Progress Notes (Signed)
Occupational Therapy Session Note  Patient Details  Name: [name] MRN: [id number] Date of Birth: 05/22/1941  Today's Date: 05/18/2020 OT Individual Time: 5038-8828 OT Individual Time Calculation (min): 50 min    Short Term Goals: Week 1:  OT Short Term Goal 1 (Week 1): Pt will complete UB bathing with Mod A OT Short Term Goal 2 (Week 1): Pt will complete 1 grooming task while sitting at the sink with mod cuing for locating items in Rt visual field OT Short Term Goal 3 (Week 1): Pt will thread 1 LE into pants with Mod A  Skilled Therapeutic Interventions/Progress Updates:    Patient in bed, alert and working with NT to eat breakfast.  She notes pain in left arm and back - nursing aware.  Min A to finish eating meal.  Min/mod A supine to sitting edge of bed, CS and cues for posterior lean in unsupported sitting.  SPT with RW bed to commode with mod A.  Max A for pants down, brief soaked with urine, no additional void on commode.  Hygiene completed seated on commode and in stance with max A.  Max A to donn incontinence brief and pants.  Dependent to complete CM in stance.  Poor standing tolerance with hygiene and CM due to fatigue.  SPT commode to bed with mod A of one and stand by of 2nd person to ensure safety.  She completed oral care with set up/CS seated edge of bed.  She declined to change her shirt at this time.  Sit to supine max A.  Max A for positioning.  She remained in bed at close of session, bed alarm set and call bell in hand.    Therapy Documentation Precautions:  Precautions Precautions: Fall Restrictions Weight Bearing Restrictions: No   Therapy/Group: Individual Therapy  Barrie Lyme 05/18/2020, 7:30 AM

## 2020-05-18 NOTE — Progress Notes (Signed)
Occupational Therapy Session Note  Patient Details  Name: [name] MRN: [id number] Date of Birth: 12-15-1940  Today's Date: 05/18/2020 OT Individual Time: 3151-7616 OT Individual Time Calculation (min): 50 min    Short Term Goals: Week 1:  OT Short Term Goal 1 (Week 1): Pt will complete UB bathing with Mod A OT Short Term Goal 2 (Week 1): Pt will complete 1 grooming task while sitting at the sink with mod cuing for locating items in Rt visual field OT Short Term Goal 3 (Week 1): Pt will thread 1 LE into pants with Mod A  Skilled Therapeutic Interventions/Progress Updates:    1:! Pt in bed when arrived. Discussed home and how to get better to go home. Pt transitioned to EOB with max A with extra time and cues for trunk posture and positioning. Pt able to perform stand pivot transfer with mod A with extra time with RW from EOb to w/c. Transitioned into bathroom and performed stand pivot from w/c to [facility] over commode with mod A with extra time and mod cues for posture. Pt able to void (also had been incontinent of urine but unaware). Transferred then from w/c to shower chair (facing the grab bar/ shower head) with mod A. Shower bench at an elevated height. Pt initiated bathing right away- including chest, front periarea, stomach, and thighs. Pt able to perform sit stands from the elevated bench with contact guard with extra time. With each sit to stand pt requires A to help place left hand on grab bar/ RW due to the heaviness of the arm and pain. Min A transfer out of the shower to the w/c. Pt performed then stand pivot transfer into the bed with mod A and max A to return to supine.   Therapy Documentation Precautions:  Precautions Precautions: Fall Restrictions Weight Bearing Restrictions: No Pain: Pain Assessment Pain Scale: Faces Faces Pain Scale: Hurts little more Pain Type: Surgical pain Pain Location: Back Pain Descriptors / Indicators: Grimacing;Discomfort Pain Onset:  On-going Pain Intervention(s): RN made aware;Repositioned;Emotional support;Distraction Multiple Pain Sites: No   Therapy/Group: Individual Therapy  Roney Mans [facility] 05/18/2020, 2:38 PM

## 2020-05-18 NOTE — Progress Notes (Signed)
Physical Therapy Session Note  Patient Details  Name: [name] MRN: [id number] Date of Birth: 1940/10/03  Today's Date: 05/18/2020 PT Individual Time: 5631-4970 PT Individual Time Calculation (min): 40 min   Short Term Goals: Week 1:  PT Short Term Goal 1 (Week 1): Pt will perform bed mobility with mod A consistantly PT Short Term Goal 2 (Week 1): Pt will transfer sit<>stand with LRAD min A PT Short Term Goal 3 (Week 1): Pt will perform simulated car transfer with RW and mod A  Skilled Therapeutic Interventions/Progress Updates:   Received pt supine in bed, pt reported feeling tired and declined getting OOB. Pt reported pain in L UE but did not state pain level just reported "it's not too bad". Pt stated she slept good last night but just feels too tired to get OOB right now but agreeable to bed level exercises. Pt performed the following exercises supine in bed with supervision and verbal cues for technique: -ankle circles clockwise/counterclockwise x20 bilaterally -active assisted SLR x10 bilaterally -hip abduction x12 bilaterally -heel slides x11 on L LE and x9 on R LE -hip adduction pillow squeezes x13 -overhead chest press with orange TB using R UE x6  -horizontal chest press with orange TB using R UE x6  Discussed D/C date as pt reporting feeling like she is ready to go now. Informed pt that she currently requires max A for car transfers, mod A for basic transfers, and mod A to walk 85ft. Educated pt that D/C date of the 23rd would allow more time to work on strength and endurance with the hopes that pt will be able to walk household distances (approximately 74ft) and perform transfers with less assist, as her son is recovering from knee surgery. Concluded session with pt supine in bed, needs within reach, and bed alarm on. L UE supported on pillows for edema management and comfort.   Therapy Documentation Precautions:  Precautions Precautions: Fall Restrictions Weight  Bearing Restrictions: No  Therapy/Group: Individual Therapy Martin Majestic PT, DPT   05/18/2020, 7:31 AM

## 2020-05-18 NOTE — Progress Notes (Signed)
[place] PHYSICAL MEDICINE & REHABILITATION PROGRESS NOTE   Subjective/Complaints: Patient seen sitting up this AM.  She states she slept well overnight.  She feels she may need meds for constipation, however, per nursing, pt with large BM on 10/5. She notes LUE pain.  ROS: +LUE pain. Denies CP, SOB, N/V/D  Objective:   No results found. Recent Labs    05/18/20 0553  WBC 9.5  HGB 9.2*  HCT 30.3*  PLT 198   No results for input(s): NA, K, CL, CO2, GLUCOSE, BUN, CREATININE, CALCIUM in the last 72 hours.  Intake/Output Summary (Last 24 hours) at 05/18/2020 1417 Last data filed at 05/18/2020 1403 Gross per 24 hour  Intake 947 ml  Output --  Net 947 ml        Physical Exam: Vital Signs Blood pressure (!) 126/57, pulse 65, temperature 98.2 F (36.8 C), resp. rate 18, height 5\' 2"  (1.575 m), weight 93 kg, SpO2 96 %.  Constitutional: No distress . Vital signs reviewed. HENT: Normocephalic.  Atraumatic. Eyes: EOMI. No discharge. Cardiovascular: No JVD.  RRR. Respiratory: Normal effort.  No stridor.  Bilateral clear to auscultation. GI: Non-distended.  BS +. Skin: Warm and dry.  Intact. Psych: Normal mood.  Normal behavior. Musc: LUE with edema and tenderness Neuro: Alertand oriented x3 Motor: Left upper extremity: Shoulder abduction, elbow flexion/extension 1+/5, handgrip 1/5, unchanged Left lower extremity: 4/5 proximal to distal Right upper extremity: 4/5 proximal distal  Assessment/Plan: 1. Functional deficits secondary to Debility following submassive PE with septic emboli and right heart strain, which require 3+ hours per day of interdisciplinary therapy in a comprehensive inpatient rehab setting.  Physiatrist is providing close team supervision and 24 hour management of active medical problems listed below.  Physiatrist and rehab team continue to assess barriers to discharge/monitor patient progress toward functional and medical goals  Care Tool:  Bathing     Body parts bathed by patient: Chest, Face   Body parts bathed by helper: Right arm, Left arm, Abdomen, Front perineal area, Buttocks, Right upper leg, Left upper leg, Right lower leg, Left lower leg     Bathing assist Assist Level: Total Assistance - Patient < 25%     Upper Body Dressing/Undressing Upper body dressing   What is the patient wearing?: Pull over shirt    Upper body assist Assist Level: Total Assistance - Patient < 25%    Lower Body Dressing/Undressing Lower body dressing      What is the patient wearing?: Pants     Lower body assist Assist for lower body dressing: Total Assistance - Patient < 25%     Toileting Toileting    Toileting assist Assist for toileting: 2 Helpers     Transfers Chair/bed transfer  Transfers assist     Chair/bed transfer assist level: Moderate Assistance - Patient 50 - 74%     Locomotion Ambulation   Ambulation assist      Assist level: 2 helpers Assistive device: Walker-rolling Max distance: 59ft   Walk 10 feet activity   Assist     Assist level: 2 helpers Assistive device: Walker-rolling   Walk 50 feet activity   Assist Walk 50 feet with 2 turns activity did not occur: Safety/medical concerns (fatigue, generalized weakness, decreased balance, pain)         Walk 150 feet activity   Assist Walk 150 feet activity did not occur: Safety/medical concerns (fatigue, generalized weakness, decreased balance, pain)         Walk 10  feet on uneven surface  activity   Assist Walk 10 feet on uneven surfaces activity did not occur: Safety/medical concerns (fatigue, generalized weakness, decreased balance, pain)         Wheelchair     Assist Will patient use wheelchair at discharge?: Yes Type of Wheelchair: Manual    Wheelchair assist level: Supervision/Verbal cueing Max wheelchair distance: 11ft    Wheelchair 50 feet with 2 turns activity    Assist        Assist Level: Total  Assistance - Patient < 25%   Wheelchair 150 feet activity     Assist      Assist Level: Total Assistance - Patient < 25%   Blood pressure (!) 126/57, pulse 65, temperature 98.2 F (36.8 C), resp. rate 18, height 5\' 2"  (1.575 m), weight 93 kg, SpO2 96 %.  Medical Assessment/Plan: 1.Debility following submassive PE with septic emboli and right heart strain.   Continue CIR   CT Head negative, appreciate neurology eval.  2. Antithrombotics: -DVT/anticoagulation:IVC filter -antiplatelet therapy: N/A 3. Pain Management:oxycodone PRN for low back pain. Continue heating pad and lidocaine patch  Continue warm compress  ?CRPS - will consider meds if no improvement  4. Mood:Provide emotional support. Denies depression but would like to continue on Buspar.  -antipsychotic agents: N/A 5. Neuropsych: This patientisnot fully capable of making decisions on herown behalf. 6. Skin/Wound Care:continue warm compress to right hematoma 7. Fluids/Electrolytes/Nutrition:Routine in and outs. 8. Dementia: Continue Aricept and Memantine. Placed order for recreational therapy to bring in board games, which she enjoys.  9. Right hip cellulitis: continue completed course of Keflex  Vancomycin DC'd 10.  Constipation: Has BM every three days PTA. Continue Linzess, Senna-docusate HS  Controlled with meds on 10/7 11.  Sleep disturbance: Continue Melatonin HS.   Avoid trazodone given #17  Ambien 2.5 nightly as needed ordered on 10/5  Improving 12. Dry eyes: Continue drops.  13.  Hypoalbuminemia  Supplement initiated on 10/4 14.  Mild transaminitis  AST elevated on 10/3, continue to monitor, plan or order labs for next week 15.  Leukocytosis: Resolved  WBCs 9.5 on 10/7  Afebrile  See #9  Continue to monitor 16.  Acute blood loss anemia  Hemoglobin 9.2 on 10/7 trending down, labs ordered for tomorrow  Continue to monitor 17.  Prolonged QTc  ECG reviewed from 9/29  showing severe prolongation in QT, repeat ECG reviewed, showing improvement in QTc   LOS: 5 days A FACE TO FACE EVALUATION WAS PERFORMED  [name] 10/29 05/18/2020, 2:17 PM

## 2020-05-18 NOTE — Progress Notes (Addendum)
Speech Language Pathology Daily Session Note  Patient Details  Name: [name] MRN: [id number] Date of Birth: Apr 03, 1941  Today's Date: 05/18/2020 SLP Individual Time: 9357-0177 SLP Individual Time Calculation (min): 27 min  Short Term Goals: Week 1: SLP Short Term Goal 1 (Week 1): Pt will utilize external memory aids to facilitate recall of daily information with min assist. SLP Short Term Goal 2 (Week 1): Pt will direct her care during basic tasks with min assist.  Skilled Therapeutic Interventions: Pt was seen for skilled ST targeting cognitive goals. Due to suspected TIA over weekend (after initial evaluation) and PT/OT reports of decline in pt's cognitive functioning, SLP reassessed pt's cognition today. MOCA version 7.1 administered, which pt scored 14/30 (WNL=26 or >), indicative of severe cognitive impairments. Specifically, short term memory, problem solving, executive functioning, and selective attention deficits noted. Pt also demonstrated poor frustration tolerance at times, and she endorses that she feels she becomes more frustrated than "normal" and frequently asked to cease tasks in fear she will not do well. Encouragement and education provided regarding importance of participation in therapies in order to work toward cognitive reorganization/rehabilitation. Pt left laying in bed with alarm set and needs within reach. Continue per current plan of care.          Pain Pain Assessment Pain Scale: Faces Faces Pain Scale: Hurts little more Pain Type: Surgical pain Pain Location: Back Pain Descriptors / Indicators: Grimacing;Discomfort Pain Onset: On-going Pain Intervention(s): RN made aware;Repositioned;Emotional support;Distraction Multiple Pain Sites: No  Therapy/Group: Individual Therapy  Little Ishikawa 05/18/2020, 10:52 AM

## 2020-05-19 ENCOUNTER — Inpatient Hospital Stay (HOSPITAL_COMMUNITY): Payer: Medicare Other | Admitting: Speech Pathology

## 2020-05-19 ENCOUNTER — Inpatient Hospital Stay (HOSPITAL_COMMUNITY): Payer: Medicare Other

## 2020-05-19 ENCOUNTER — Inpatient Hospital Stay (HOSPITAL_COMMUNITY): Payer: Medicare Other | Admitting: Occupational Therapy

## 2020-05-19 LAB — CBC
HCT: 31.6 % — ABNORMAL LOW (ref 36.0–46.0)
Hemoglobin: 9.7 g/dL — ABNORMAL LOW (ref 12.0–15.0)
MCH: 30.6 pg (ref 26.0–34.0)
MCHC: 30.7 g/dL (ref 30.0–36.0)
MCV: 99.7 fL (ref 80.0–100.0)
Platelets: 192 10*3/uL (ref 150–400)
RBC: 3.17 MIL/uL — ABNORMAL LOW (ref 3.87–5.11)
RDW: 15.2 % (ref 11.5–15.5)
WBC: 9.4 10*3/uL (ref 4.0–10.5)
nRBC: 0 % (ref 0.0–0.2)

## 2020-05-19 MED ORDER — MIRTAZAPINE 15 MG PO TABS
15.0000 mg | ORAL_TABLET | Freq: Every day | ORAL | Status: DC
  Administered 2020-05-19 – 2020-05-22 (×4): 15 mg via ORAL
  Filled 2020-05-19 (×4): qty 1

## 2020-05-19 NOTE — Progress Notes (Signed)
Pt tearful and sad. Pt states she wants to go home, and sad that no one has come to visit her. This nurse sat with the pt, Emotional support provided.

## 2020-05-19 NOTE — Progress Notes (Signed)
Occupational Therapy Session Note  Patient Details  Name: [name] MRN: [id number] Date of Birth: 09/11/1940  Today's Date: 05/19/2020 OT Individual Time: 4196-2229 OT Individual Time Calculation (min): 40 min    Short Term Goals: Week 1:  OT Short Term Goal 1 (Week 1): Pt will complete UB bathing with Mod A OT Short Term Goal 2 (Week 1): Pt will complete 1 grooming task while sitting at the sink with mod cuing for locating items in Rt visual field OT Short Term Goal 3 (Week 1): Pt will thread 1 LE into pants with Mod A  Skilled Therapeutic Interventions/Progress Updates:    Treatment session with focus on bed mobility, sitting balance, sit > stand, standing balance, and edema management.  Pt received supine in bed reporting feeling "depressed".  Pt stating that she didn't feel up to doing anything with therapy.  Nurse tech arrived to offer changing bed linens.  Pt agreeable to getting OOB to allow for nurse tech to change sheets.  Pt required max assist for rolling and sidelying to sitting with cues for sequencing and technique.  Once seated EOB, pt requested to return to bed.  Completed sit > stand with RW with max assist, +2 present for safety.  Pt declined transferring, therefore maintained standing 60-90 seconds to allow sheets to be removed from bed.  Engaged in static sitting balance at EOB with supervision while therapist cut strips of kinesiotape to apply to LUE for edema management.  Completed sit > stand again max assist, min assist once upright with increased cues for upright posture.  Pt tolerated standing ~60 seconds on 2nd attempt.  Pt returned to bed total assist and returned to semi-reclined in bed with all needs in reach.  Therapist applied kinesiotape to forearm and repositioned LUE on pillows and applied heat on top for edema management.  Pt continues to require max encouragement for any participation.  Therapy Documentation Precautions:  Precautions Precautions:  Fall Restrictions Weight Bearing Restrictions: No Pain: Pain Assessment Pain Scale: 0-10 Pain Score: 0-No pain   Therapy/Group: Individual Therapy  Rosalio Loud 05/19/2020, 3:16 PM

## 2020-05-19 NOTE — Progress Notes (Signed)
Pt awake and resting in bed at this time. Pt states the Ambien ordered is not effective. Pt denies pain at this time. Provider will be made awake of pt inability to sleep.

## 2020-05-19 NOTE — Progress Notes (Addendum)
[place] PHYSICAL MEDICINE & REHABILITATION PROGRESS NOTE   Subjective/Complaints: Patient seen sitting up in bed this morning.  She states she slept well overnight.  She notes left upper extremity pain.  She states she cannot move her arm, but when tested she is able to hold it against gravity.  ROS: +LUE pain. Denies CP, SOB, N/V/D  Objective:   No results found. Recent Labs    05/18/20 0553  WBC 9.5  HGB 9.2*  HCT 30.3*  PLT 198   No results for input(s): NA, K, CL, CO2, GLUCOSE, BUN, CREATININE, CALCIUM in the last 72 hours.  Intake/Output Summary (Last 24 hours) at 05/19/2020 1100 Last data filed at 05/19/2020 0842 Gross per 24 hour  Intake 840 ml  Output --  Net 840 ml        Physical Exam: Vital Signs Blood pressure (!) 102/45, pulse 61, temperature 98.4 F (36.9 C), resp. rate 16, height 5\' 2"  (1.575 m), weight 93 kg, SpO2 95 %.  Constitutional: No distress . Vital signs reviewed. HENT: Normocephalic.  Atraumatic. Eyes: Left is limited. No discharge. Cardiovascular: No JVD.  RRR. Respiratory: Normal effort.  No stridor.  Bilateral clear to auscultation. GI: Non-distended.  BS +. Skin: Warm and dry.  Intact. Psych: Normal mood.  Normal behavior. Musc: Left upper extremity with edema and tenderness, improving Neuro: Alert and oriented Motor: Left upper extremity: Shoulder abduction 4 -/5, elbow flexion/extension 2/5, handgrip 1/5, some pain inhibition Left lower extremity: 4/5 proximal to distal Right upper extremity: 4/5 proximal distal  Assessment/Plan: 1. Functional deficits secondary to Debility following submassive PE with septic emboli and right heart strain, which require 3+ hours per day of interdisciplinary therapy in a comprehensive inpatient rehab setting.  Physiatrist is providing close team supervision and 24 hour management of active medical problems listed below.  Physiatrist and rehab team continue to assess barriers to discharge/monitor  patient progress toward functional and medical goals  Care Tool:  Bathing    Body parts bathed by patient: Chest, Abdomen, Front perineal area, Right upper leg, Left upper leg, Face   Body parts bathed by helper: Right arm, Left arm, Abdomen, Front perineal area, Buttocks, Right upper leg, Left upper leg, Right lower leg, Left lower leg Body parts n/a: Right arm, Left arm, Buttocks, Right lower leg, Left lower leg   Bathing assist Assist Level: Moderate Assistance - Patient 50 - 74%     Upper Body Dressing/Undressing Upper body dressing   What is the patient wearing?: Pull over shirt    Upper body assist Assist Level: Maximal Assistance - Patient 25 - 49%    Lower Body Dressing/Undressing Lower body dressing      What is the patient wearing?: Pants     Lower body assist Assist for lower body dressing: Total Assistance - Patient < 25%     Toileting Toileting    Toileting assist Assist for toileting: Total Assistance - Patient < 25%     Transfers Chair/bed transfer  Transfers assist     Chair/bed transfer assist level: Moderate Assistance - Patient 50 - 74%     Locomotion Ambulation   Ambulation assist      Assist level: 2 helpers Assistive device: Walker-rolling Max distance: 68ft   Walk 10 feet activity   Assist     Assist level: 2 helpers Assistive device: Walker-rolling   Walk 50 feet activity   Assist Walk 50 feet with 2 turns activity did not occur: Safety/medical concerns (fatigue, generalized weakness,  decreased balance, pain)         Walk 150 feet activity   Assist Walk 150 feet activity did not occur: Safety/medical concerns (fatigue, generalized weakness, decreased balance, pain)         Walk 10 feet on uneven surface  activity   Assist Walk 10 feet on uneven surfaces activity did not occur: Safety/medical concerns (fatigue, generalized weakness, decreased balance, pain)         Wheelchair     Assist Will  patient use wheelchair at discharge?: Yes Type of Wheelchair: Manual    Wheelchair assist level: Supervision/Verbal cueing Max wheelchair distance: 85ft    Wheelchair 50 feet with 2 turns activity    Assist        Assist Level: Total Assistance - Patient < 25%   Wheelchair 150 feet activity     Assist      Assist Level: Total Assistance - Patient < 25%   Blood pressure (!) 102/45, pulse 61, temperature 98.4 F (36.9 C), resp. rate 16, height 5\' 2"  (1.575 m), weight 93 kg, SpO2 95 %.  Medical Assessment/Plan: 1.Debility following submassive PE with septic emboli and right heart strain.   Continue CIR   CT Head negative, appreciate neurology eval.  2. Antithrombotics: -DVT/anticoagulation:IVC filter  Will order left upper extremity ultrasound -antiplatelet therapy: N/A 3. Pain Management:oxycodone PRN for low back pain. Continue heating pad and lidocaine patch  Continue warm compress  ?CRPS - will consider meds if no improvement  4. Mood:Provide emotional support. Denies depression but would like to continue on Buspar.  -antipsychotic agents: N/A 5. Neuropsych: This patientisnot fully capable of making decisions on herown behalf. 6. Skin/Wound care: Continue warm compress to right hematoma 7. Fluids/Electrolytes/Nutrition:Routine in and outs.  Remeron started appetite on 10/8 8. Dementia: Continue Aricept and Memantine. Placed order for recreational therapy to bring in board games, which she enjoys.  9. Right hip cellulitis: continue completed course of Keflex  Vancomycin DC'd 10.  Constipation: Has BM every three days PTA. Continue Linzess, Senna-docusate HS  Controlled with meds on 10/8 11.  Sleep disturbance: Continue Melatonin HS.   Avoid trazodone given #17  Ambien 2.5 nightly as needed ordered on 10/5  Improving 12. Dry eyes: Continue drops.  13.  Hypoalbuminemia  Supplement initiated on 10/4 14.  Mild  transaminitis  AST elevated on 10/3, continue to monitor, labs ordered for Monday 15.  Leukocytosis: Resolved  WBCs 9.5 on 10/7  Afebrile  See #9  Continue to monitor 16.  Acute blood loss anemia  Hemoglobin 9.2 on 10/7 trending down, labs pending  Labs ordered for Monday  Continue to monitor 17.  Prolonged QTc  ECG reviewed from 9/29 showing severe prolongation in QT, repeat ECG reviewed, showing improvement in QTc   LOS: 6 days A FACE TO FACE EVALUATION WAS PERFORMED  Strother Everitt 10/29 05/19/2020, 11:00 AM

## 2020-05-19 NOTE — Progress Notes (Signed)
Speech Language Pathology Daily Session Note  Patient Details  Name: [name] MRN: [id number] Date of Birth: 1941-07-09  Today's Date: 05/19/2020 SLP Individual Time: 1300-1330 SLP Individual Time Calculation (min): 30 min  Short Term Goals: Week 1: SLP Short Term Goal 1 (Week 1): Pt will utilize external memory aids to facilitate recall of daily information with min assist. SLP Short Term Goal 2 (Week 1): Pt will direct her care during basic tasks with min assist.  Skilled Therapeutic Interventions:   Patient seen for skilled SLP session focusing on cognitive goals. Patient oriented to fact that she was in the hospital but stated "Cardiovascular" unit and when told she was in inpatient rehab, she acted surprised, "oh, Im in rehab?" She stated month as November, year as 2016 but was accurate with stating day of week as Friday. Affect was fairly flat, patient appeared to be in a depressed mood. When asked what she was sad about, she said she was "sad about everything". Patient did return demonstrate to use call button after SLP demonstration. She c/o  Heating pad being too hot on her left arm and so SLP put a towel between it and her arm and told patient to push heating pad off if its too hot and call nurse. She did state that her memory was poor now which she felt was different from just prior to this admission. Patient benefits from skilled SLP intervention to focus on cognitive goals prior to discharge.  Pain Pain Assessment Pain Scale: Faces Pain Score: 0-No pain Faces Pain Scale: Hurts little more Pain Location: Arm Pain Orientation: Left Pain Descriptors / Indicators: Aching;Discomfort Pain Onset: On-going Pain Intervention(s): RN made aware  Therapy/Group: Individual Therapy   Angela Nevin, MA, CCC-SLP Speech Therapy

## 2020-05-19 NOTE — Progress Notes (Signed)
Physical Therapy Session Note  Patient Details  Name: [name] MRN: [id number] Date of Birth: [birth year]  Today's Date: 05/19/2020 PT Individual Time: 1001-1042 PT Individual Time Calculation (min): 41 min  and Today's Date: 05/19/2020 PT Missed Time: 19 Minutes Missed Time Reason: Patient fatigue  Short Term Goals: Week 1:  PT Short Term Goal 1 (Week 1): Pt will perform bed mobility with mod A consistantly PT Short Term Goal 2 (Week 1): Pt will transfer sit<>stand with LRAD min A PT Short Term Goal 3 (Week 1): Pt will perform simulated car transfer with RW and mod A  Skilled Therapeutic Interventions/Progress Updates:   Received pt supine in bed feeling "tired" and reported pain in L UE but did not state pain level. Repositioning and rest breaks given to reduce pain levels. Pt initially declining participating in therapy but with extensive convincing pt agreed to get dressed. Session with emphasis on functional mobility/transfers, dressing, generalized strengthening, dynamic standing balance/coordination, and improved activity tolerance. Pt unsure if brief was clean or not; checked and brief dirty. Doffed dirty brief and donned clean one and pants with max/total A and pt able to roll L/R with mod A and use of bedrails with increased time. Donned non-skid socks total A. Pt transferred supine<>sitting EOB with HOB elevated, use of bedrails, and cues for logroll technique with max A. Pt requiring max A fading to CGA to maintain static sitting balance. Doffed gown and donned pull over shirt with max A. Pt then stated "I don't wanna sit up anymore, I'm tired". Therapist encouraged OOB mobility for strengthening, cardiovascular endurance, and improved activity tolerance and pt finally agreed to attempt to get in [facility]. Attempted sit<>stand from elevated bed x 2 trials with max A however unable to achieve upright positoning despite cues for knee and trunk extension. Pt then stated "I told you, I'm  tired and I can't do this, I want to lay back down." Sit<>supine with total A. Pt required total A and use of Trendelenburg bed position to scoot to [facility]. Concluded session with pt supine in bed, needs within reach, and bed alarm on. Therapist assisted with positioning R UE on pillows for support and edema management. 19 minutes missed of skilled physical therapy due to pt fatigue.   Therapy Documentation Precautions:  Precautions Precautions: Fall Restrictions Weight Bearing Restrictions: No   Therapy/Group: Individual Therapy Martin Majestic PT, DPT   05/19/2020, 7:20 AM

## 2020-05-20 ENCOUNTER — Inpatient Hospital Stay (HOSPITAL_COMMUNITY): Payer: Medicare Other

## 2020-05-20 DIAGNOSIS — M79609 Pain in unspecified limb: Secondary | ICD-10-CM | POA: Diagnosis not present

## 2020-05-20 DIAGNOSIS — M7989 Other specified soft tissue disorders: Secondary | ICD-10-CM | POA: Diagnosis not present

## 2020-05-20 NOTE — Progress Notes (Signed)
[place] PHYSICAL MEDICINE & REHABILITATION PROGRESS NOTE   Subjective/Complaints:   Pt reports not hungry- "hasn't been for awhile"- not really eating- well per NT- denies pain at all.   Spit food out after I left per NT.   ROS:  Pt denies SOB, abd pain, CP, N/V/C/D, and vision changes   Objective:   No results found. Recent Labs    05/18/20 0553 05/19/20 1147  WBC 9.5 9.4  HGB 9.2* 9.7*  HCT 30.3* 31.6*  PLT 198 192   No results for input(s): NA, K, CL, CO2, GLUCOSE, BUN, CREATININE, CALCIUM in the last 72 hours.  Intake/Output Summary (Last 24 hours) at 05/20/2020 1334 Last data filed at 05/20/2020 0800 Gross per 24 hour  Intake 110 ml  Output --  Net 110 ml        Physical Exam: Vital Signs Blood pressure (!) 101/59, pulse 65, temperature 98.4 F (36.9 C), resp. rate 18, height '5\' 2"'  (1.575 m), weight 93 kg, SpO2 96 %.  Constitutional: No distress . Vital signs reviewed. Sitting up barely taking in food that NT is feeding her, NAD HENT: Normocephalic.  Atraumatic. Eyes: Left is limited. No discharge. Cardiovascular: RRR Respiratory: CTA B/L- no W/R/R- good air movement GI:  Soft, NT, ND, (+)BS  Skin: Warm and dry.  Intact. Psych: flat- and staring Musc: Left upper extremity with edema and tenderness, improving Neuro: Alert and oriented Motor: Left upper extremity: Shoulder abduction 4 -/5, elbow flexion/extension 2/5, handgrip 1/5, some pain inhibition Left lower extremity: 4/5 proximal to distal Right upper extremity: 4/5 proximal distal  Assessment/Plan: 1. Functional deficits secondary to Debility following submassive PE with septic emboli and right heart strain, which require 3+ hours per day of interdisciplinary therapy in a comprehensive inpatient rehab setting.  Physiatrist is providing close team supervision and 24 hour management of active medical problems listed below.  Physiatrist and rehab team continue to assess barriers to  discharge/monitor patient progress toward functional and medical goals  Care Tool:  Bathing    Body parts bathed by patient: Chest, Abdomen, Front perineal area, Right upper leg, Left upper leg, Face   Body parts bathed by helper: Right arm, Left arm, Abdomen, Front perineal area, Buttocks, Right upper leg, Left upper leg, Right lower leg, Left lower leg Body parts n/a: Right arm, Left arm, Buttocks, Right lower leg, Left lower leg   Bathing assist Assist Level: Moderate Assistance - Patient 50 - 74%     Upper Body Dressing/Undressing Upper body dressing   What is the patient wearing?: Pull over shirt    Upper body assist Assist Level: Maximal Assistance - Patient 25 - 49%    Lower Body Dressing/Undressing Lower body dressing      What is the patient wearing?: Pants     Lower body assist Assist for lower body dressing: Total Assistance - Patient < 25%     Toileting Toileting    Toileting assist Assist for toileting: Total Assistance - Patient < 25%     Transfers Chair/bed transfer  Transfers assist     Chair/bed transfer assist level: Moderate Assistance - Patient 50 - 74%     Locomotion Ambulation   Ambulation assist      Assist level: 2 helpers Assistive device: Walker-rolling Max distance: 75f   Walk 10 feet activity   Assist     Assist level: 2 helpers Assistive device: Walker-rolling   Walk 50 feet activity   Assist Walk 50 feet with 2 turns activity  did not occur: Safety/medical concerns (fatigue, generalized weakness, decreased balance, pain)         Walk 150 feet activity   Assist Walk 150 feet activity did not occur: Safety/medical concerns (fatigue, generalized weakness, decreased balance, pain)         Walk 10 feet on uneven surface  activity   Assist Walk 10 feet on uneven surfaces activity did not occur: Safety/medical concerns (fatigue, generalized weakness, decreased balance, pain)          Wheelchair     Assist Will patient use wheelchair at discharge?: Yes Type of Wheelchair: Manual    Wheelchair assist level: Supervision/Verbal cueing Max wheelchair distance: 84f    Wheelchair 50 feet with 2 turns activity    Assist        Assist Level: Total Assistance - Patient < 25%   Wheelchair 150 feet activity     Assist      Assist Level: Total Assistance - Patient < 25%   Blood pressure (!) 101/59, pulse 65, temperature 98.4 F (36.9 C), resp. rate 18, height '5\' 2"'  (1.575 m), weight 93 kg, SpO2 96 %.  Medical Assessment/Plan: 1.Debility following submassive PE with septic emboli and right heart strain.   Continue CIR   CT Head negative, appreciate neurology eval.  2. Antithrombotics: -DVT/anticoagulation:IVC filter  Will order left upper extremity ultrasound  10/9- Dopplers pending -antiplatelet therapy: N/A 3. Pain Management:oxycodone PRN for low back pain. Continue heating pad and lidocaine patch  Continue warm compress  ?CRPS - will consider meds if no improvement   10/9- Had fluid collection of LUE soft tissue- abscess vs hematoma? 4. Mood:Provide emotional support. Denies depression but would like to continue on Buspar.  -antipsychotic agents: N/A 5. Neuropsych: This patientisnot fully capable of making decisions on herown behalf. 6. Skin/Wound care: Continue warm compress to LUE hematoma 7. Fluids/Electrolytes/Nutrition:Routine in and outs.  Remeron started appetite on 10/8 8. Dementia: Continue Aricept and Memantine. Placed order for recreational therapy to bring in board games, which she enjoys.  9. Right hip cellulitis: continue completed course of Keflex  Vancomycin DC'd 10.  Constipation: Has BM every three days PTA. Continue Linzess, Senna-docusate HS  Controlled with meds on 10/8 11.  Sleep disturbance: Continue Melatonin HS.   Avoid trazodone given #17  Ambien 2.5 nightly as needed ordered on  10/5  Improving 12. Dry eyes: Continue drops.  13.  Hypoalbuminemia  Supplement initiated on 10/4 14.  Mild transaminitis  AST elevated on 10/3, continue to monitor, labs ordered for Monday 15.  Leukocytosis: Resolved  WBCs 9.5 on 10/7  Afebrile  See #9  Continue to monitor 16.  Acute blood loss anemia  Hemoglobin 9.2 on 10/7 trending down, labs pending  Labs ordered for Monday  Continue to monitor 17.  Prolonged QTc  ECG reviewed from 9/29 showing severe prolongation in QT, repeat ECG reviewed, showing improvement in QTc 18. Poor appetite  10/9- was spitting out food today- will check Prealbumin, total protein.  19., LUE fluid collection  10/9- check ESR and CRP for baseline- just to make sure no issues   LOS: 7 days A FACE TO FACE EVALUATION WAS PERFORMED  Kemper Heupel 05/20/2020, 1:34 PM

## 2020-05-20 NOTE — Progress Notes (Signed)
Left upper extremity venous duplex completed. Refer to "CV Proc" under chart review to view preliminary results.  05/20/2020 2:43 PM Eula Fried., MHA, RVT, RDCS, RDMS

## 2020-05-21 ENCOUNTER — Inpatient Hospital Stay (HOSPITAL_COMMUNITY): Payer: Medicare Other | Admitting: Physical Therapy

## 2020-05-21 ENCOUNTER — Inpatient Hospital Stay (HOSPITAL_COMMUNITY): Payer: Medicare Other | Admitting: Occupational Therapy

## 2020-05-21 MED ORDER — SORBITOL 70 % SOLN
30.0000 mL | Freq: Once | Status: AC
  Administered 2020-05-21: 30 mL via ORAL
  Filled 2020-05-21: qty 30

## 2020-05-21 NOTE — Plan of Care (Signed)
  Problem: Consults Goal: RH GENERAL PATIENT EDUCATION Description: See Patient Education module for education specifics. Outcome: Progressing   Problem: RH SAFETY Goal: RH STG ADHERE TO SAFETY PRECAUTIONS W/ASSISTANCE/DEVICE Description: STG Adhere to Safety Precautions With  min Assistance/Devices per therapy and protocol Outcome: Progressing Goal: RH STG DECREASED RISK OF FALL WITH ASSISTANCE Description: STG Decreased Risk of Fall With mod  Assistance. Outcome: Progressing

## 2020-05-21 NOTE — Progress Notes (Signed)
[place] PHYSICAL MEDICINE & REHABILITATION PROGRESS NOTE   Subjective/Complaints:   Pt admits ate "a little" this AM, but also admitted "she spit some, but not all" out.   Denies pain- feels good otherwise.    ROS:   Pt denies SOB, abd pain, CP, N/V/C/D, and vision changes   Objective:   VAS [place] UPPER EXTREMITY VENOUS DUPLEX  Result Date: 05/21/2020 UPPER VENOUS STUDY  Indications: Swelling, and Pain Limitations: Patient position, restricted mobility. Comparison Study: No prior study Performing Technologist: Maudry Mayhew MHA, RDMS, RVT, RDCS  Examination Guidelines: A complete evaluation includes B-mode imaging, spectral Doppler, color Doppler, and power Doppler as needed of all accessible portions of each vessel. Bilateral testing is considered an integral part of a complete examination. Limited examinations for reoccurring indications may be performed as noted.  Left Findings: +----------+------------+---------+-----------+----------+--------------+ LEFT      CompressiblePhasicitySpontaneousProperties   Summary     +----------+------------+---------+-----------+----------+--------------+ IJV                                                 Not visualized +----------+------------+---------+-----------+----------+--------------+ Subclavian    Full       Yes       Yes                             +----------+------------+---------+-----------+----------+--------------+ Axillary                                            Not visualized +----------+------------+---------+-----------+----------+--------------+ Brachial      Full       Yes       Yes                             +----------+------------+---------+-----------+----------+--------------+ Radial        Full                                                 +----------+------------+---------+-----------+----------+--------------+ Ulnar                                               Not  visualized +----------+------------+---------+-----------+----------+--------------+ Cephalic      Full                                                 +----------+------------+---------+-----------+----------+--------------+ Basilic                                             Not visualized +----------+------------+---------+-----------+----------+--------------+  Summary:  Left: No obvious evidence of deep or superficial vein thrombosis involving the visualized veins of the left upper extremity. There is a mostly cystic area of the left  medial mid upper arm measuring at least 5.2cm; suggestive of possible hematoma versus seroma versus unknown etiology.  *See table(s) above for measurements and observations.  Diagnosing physician: Servando Snare MD Electronically signed by Servando Snare MD on 05/21/2020 at 11:33:08 AM.    Final    Recent Labs    05/19/20 1147  WBC 9.4  HGB 9.7*  HCT 31.6*  PLT 192   No results for input(s): NA, K, CL, CO2, GLUCOSE, BUN, CREATININE, CALCIUM in the last 72 hours.  Intake/Output Summary (Last 24 hours) at 05/21/2020 1257 Last data filed at 05/21/2020 0900 Gross per 24 hour  Intake 170 ml  Output --  Net 170 ml        Physical Exam: Vital Signs Blood pressure 124/62, pulse (!) 57, temperature 97.8 F (36.6 C), temperature source Oral, resp. rate 18, height '5\' 2"'  (1.575 m), weight 93 kg, SpO2 97 %.  Constitutional: No distress .laying in bed- staring at blank tv, blank stare until spoke with her, NAD HENT: Normocephalic.  Atraumatic. Eyes: Left is limited. No discharge. Cardiovascular: borderline bradycardia- regular rhythm Respiratory: CTA B/L- no W/R/R- good air movement GI:  Soft, NT, ND, (+)BS  Skin: Warm and dry.  Intact. Psych: flat- and staring- at blank tv Musc: Left upper extremity with edema and tenderness, improving Neuro: Alert and oriented Motor: Left upper extremity: Shoulder abduction 4 -/5, elbow flexion/extension 2/5,  handgrip 1/5, some pain inhibition Left lower extremity: 4/5 proximal to distal Right upper extremity: 4/5 proximal distal  Assessment/Plan: 1. Functional deficits secondary to Debility following submassive PE with septic emboli and right heart strain, which require 3+ hours per day of interdisciplinary therapy in a comprehensive inpatient rehab setting.  Physiatrist is providing close team supervision and 24 hour management of active medical problems listed below.  Physiatrist and rehab team continue to assess barriers to discharge/monitor patient progress toward functional and medical goals  Care Tool:  Bathing    Body parts bathed by patient: Chest, Abdomen, Front perineal area, Right upper leg, Left upper leg, Face   Body parts bathed by helper: Right arm, Left arm, Abdomen, Front perineal area, Buttocks, Right upper leg, Left upper leg, Right lower leg, Left lower leg Body parts n/a: Right arm, Left arm, Buttocks, Right lower leg, Left lower leg   Bathing assist Assist Level: Moderate Assistance - Patient 50 - 74%     Upper Body Dressing/Undressing Upper body dressing   What is the patient wearing?: Pull over shirt    Upper body assist Assist Level: Maximal Assistance - Patient 25 - 49%    Lower Body Dressing/Undressing Lower body dressing      What is the patient wearing?: Pants     Lower body assist Assist for lower body dressing: Total Assistance - Patient < 25%     Toileting Toileting    Toileting assist Assist for toileting: Total Assistance - Patient < 25%     Transfers Chair/bed transfer  Transfers assist     Chair/bed transfer assist level: Moderate Assistance - Patient 50 - 74%     Locomotion Ambulation   Ambulation assist      Assist level: 2 helpers Assistive device: Walker-rolling Max distance: 17f   Walk 10 feet activity   Assist     Assist level: 2 helpers Assistive device: Walker-rolling   Walk 50 feet  activity   Assist Walk 50 feet with 2 turns activity did not occur: Safety/medical concerns (fatigue, generalized weakness, decreased balance, pain)  Walk 150 feet activity   Assist Walk 150 feet activity did not occur: Safety/medical concerns (fatigue, generalized weakness, decreased balance, pain)         Walk 10 feet on uneven surface  activity   Assist Walk 10 feet on uneven surfaces activity did not occur: Safety/medical concerns (fatigue, generalized weakness, decreased balance, pain)         Wheelchair     Assist Will patient use wheelchair at discharge?: Yes Type of Wheelchair: Manual    Wheelchair assist level: Supervision/Verbal cueing Max wheelchair distance: 90f    Wheelchair 50 feet with 2 turns activity    Assist        Assist Level: Total Assistance - Patient < 25%   Wheelchair 150 feet activity     Assist      Assist Level: Total Assistance - Patient < 25%   Blood pressure 124/62, pulse (!) 57, temperature 97.8 F (36.6 C), temperature source Oral, resp. rate 18, height '5\' 2"'  (1.575 m), weight 93 kg, SpO2 97 %.  Medical Assessment/Plan: 1.Debility following submassive PE with septic emboli and right heart strain.   Continue CIR   CT Head negative, appreciate neurology eval.  2. Antithrombotics: -DVT/anticoagulation:IVC filter  Will order left upper extremity ultrasound  10/9- Dopplers pending  10/10- dopplers shows a cystic structure 5.2 cm in L medial arm- seroma vs hematoma per Dopplers -antiplatelet therapy: N/A 3. Pain Management:oxycodone PRN for low back pain. Continue heating pad and lidocaine patch  Continue warm compress  ?CRPS - will consider meds if no improvement   10/9- Had fluid collection of LUE soft tissue- abscess vs hematoma?  10/10- denies pain- con't regimen as needed 4. Mood:Provide emotional support. Denies depression but would like to continue on Buspar.   -antipsychotic agents: N/A 5. Neuropsych: This patientisnot fully capable of making decisions on herown behalf. 6. Skin/Wound care: Continue warm compress to LUE hematoma  10/10- con't warm compresses 7. Fluids/Electrolytes/Nutrition:Routine in and outs.  Remeron started appetite on 10/8  10/10- pt spitting out a lot of the food- suggest calorie count for 2-3 days 8. Dementia: Continue Aricept and Memantine. Placed order for recreational therapy to bring in board games, which she enjoys.  9. Right hip cellulitis: continue completed course of Keflex  Vancomycin DC'd 10.  Constipation: Has BM every three days PTA. Continue Linzess, Senna-docusate HS  Controlled with meds on 10/8  10/10- LBM 10/7- will give Sorbitol today if no BM 11.  Sleep disturbance: Continue Melatonin HS.   Avoid trazodone given #17  Ambien 2.5 nightly as needed ordered on 10/5  Improving 12. Dry eyes: Continue drops.  13.  Hypoalbuminemia  Supplement initiated on 10/4 14.  Mild transaminitis  AST elevated on 10/3, continue to monitor, labs ordered for Monday 15.  Leukocytosis: Resolved  WBCs 9.5 on 10/7  Afebrile  See #9  Continue to monitor 16.  Acute blood loss anemia  Hemoglobin 9.2 on 10/7 trending down, labs pending  Labs ordered for Monday  Continue to monitor 17.  Prolonged QTc  ECG reviewed from 9/29 showing severe prolongation in QT, repeat ECG reviewed, showing improvement in QTc 18. Poor appetite  10/9- was spitting out food today- will check Prealbumin, total protein.  10/10- suggest calorie count x2-3 days  19., LUE fluid collection  10/9- check ESR and CRP for baseline- just to make sure no issues  10/10- ordered for Monday    LOS: 8 days A FACE TO FACE EVALUATION WAS PERFORMED  Riven Mabile 05/21/2020, 12:57 PM

## 2020-05-21 NOTE — Progress Notes (Signed)
Occupational Therapy Session Note  Patient Details  Name: [name] MRN: [id number] Date of Birth: [birth year]  Today's Date: 05/21/2020 OT Individual Time: 1020-1030 OT Individual Time Calculation (min): 10 min  and Today's Date: 05/21/2020 OT Missed Time: 35 Minutes Missed Time Reason: Patient unwilling/refused to participate without medical reason;Patient fatigue   Short Term Goals: Week 1:  OT Short Term Goal 1 (Week 1): Pt will complete UB bathing with Mod A OT Short Term Goal 2 (Week 1): Pt will complete 1 grooming task while sitting at the sink with mod cuing for locating items in Rt visual field OT Short Term Goal 3 (Week 1): Pt will thread 1 LE into pants with Mod A  Skilled Therapeutic Interventions/Progress Updates:    Attempted to engage pt in any functional mobility or OOB activity.  Pt received semi-reclined in bed with head drooped to Lt.  Pt opened eyes to stimulus, reporting sleeping overnight but still tired.  Therapist encouraged pt to engage in any functional, motivating task however pt keeping eyes down and saying "I just want to sleep".  Therapist engaged in brief PROM to LUE, as pt reports pain with any movement.  Reapplied k-pad to LUE and positioned in elevation on pillows for edema management. Pt appearing very "down" with limited engagement with therapist.  Pt asking for therapist to allow her to rest, therefore pt missed 35 mins therapy session.  Therapy Documentation Precautions:  Precautions Precautions: Fall Restrictions Weight Bearing Restrictions: No General: General OT Amount of Missed Time: 35 Minutes Vital Signs: Therapy Vitals BP: 124/62 Pain: Pain Assessment Pain Scale: 0-10 Pain Score: 4  Pain Type: Acute pain Pain Location: Arm Pain Orientation: Left Pain Intervention(s): Repositioned;Heat applied   Therapy/Group: Individual Therapy  Rosalio Loud 05/21/2020, 12:18 PM

## 2020-05-21 NOTE — Progress Notes (Signed)
Physical Therapy Session Note  Patient Details  Name: [name] MRN: [id number] Date of Birth: 09/03/1940  Today's Date: 05/21/2020 PT Individual Time: 1430-1453 PT Individual Time Calculation (min): 23 min  PT Amount of Missed Time (min): 22 Minutes PT Missed Treatment Reason: Patient unwilling to participate  Short Term Goals: Week 1:  PT Short Term Goal 1 (Week 1): Pt will perform bed mobility with mod A consistantly PT Short Term Goal 2 (Week 1): Pt will transfer sit<>stand with LRAD min A PT Short Term Goal 3 (Week 1): Pt will perform simulated car transfer with RW and mod A  Skilled Therapeutic Interventions/Progress Updates:    Pt received sidelying in bed, agreeable to PT session. Pt reports pain in low back at rest that increases with mobility, not rated and declines intervention. Sidelying to sitting EOB with max A for LE management and trunk control. Pt exhibits L lateral lean in sitting, utilization of RUE on chair in front of patient and mod/max A for trunk control to maintain upright posture. Pt able to sit EOB 2 x 5 min with mod to max A overall. Pt declines any OOB mobility this PM due to pain and fatigue. Pt requests to return to supine after 2 bouts of sitting EOB. Pt sets goal with therapist to get out of bed tomorrow as her ultimate goal is to go home and she needs to get out of bed to achieve this. Pt returned to supine with mod A for BLE management. Pt is total A to dependent to scoot up towards HOB. Pt left supine in bed with needs in reach, bed alarm in place at end of session. Pt missed 22 min of scheduled therapy session due to refusal of further participation.  Therapy Documentation Precautions:  Precautions Precautions: Fall Restrictions Weight Bearing Restrictions: No General: PT Amount of Missed Time (min): 22 Minutes PT Missed Treatment Reason: Patient unwilling to participate    Therapy/Group: Individual Therapy   Peter Congo, PT,  DPT  05/21/2020, 3:01 PM

## 2020-05-22 ENCOUNTER — Inpatient Hospital Stay (HOSPITAL_COMMUNITY): Payer: Medicare Other | Admitting: Speech Pathology

## 2020-05-22 ENCOUNTER — Inpatient Hospital Stay (HOSPITAL_COMMUNITY): Payer: Medicare Other

## 2020-05-22 ENCOUNTER — Inpatient Hospital Stay (HOSPITAL_COMMUNITY): Payer: Medicare Other | Admitting: Occupational Therapy

## 2020-05-22 DIAGNOSIS — R600 Localized edema: Secondary | ICD-10-CM

## 2020-05-22 DIAGNOSIS — N179 Acute kidney failure, unspecified: Secondary | ICD-10-CM

## 2020-05-22 LAB — COMPREHENSIVE METABOLIC PANEL
ALT: 37 U/L (ref 0–44)
AST: 30 U/L (ref 15–41)
Albumin: 2.6 g/dL — ABNORMAL LOW (ref 3.5–5.0)
Alkaline Phosphatase: 63 U/L (ref 38–126)
Anion gap: 12 (ref 5–15)
BUN: 83 mg/dL — ABNORMAL HIGH (ref 8–23)
CO2: 22 mmol/L (ref 22–32)
Calcium: 8.6 mg/dL — ABNORMAL LOW (ref 8.9–10.3)
Chloride: 103 mmol/L (ref 98–111)
Creatinine, Ser: 2.69 mg/dL — ABNORMAL HIGH (ref 0.44–1.00)
GFR, Estimated: 16 mL/min — ABNORMAL LOW (ref >60)
Glucose, Bld: 138 mg/dL — ABNORMAL HIGH (ref 70–99)
Potassium: 4.8 mmol/L (ref 3.5–5.1)
Sodium: 137 mmol/L (ref 135–145)
Total Bilirubin: 4.4 mg/dL — ABNORMAL HIGH (ref 0.3–1.2)
Total Protein: 7.1 g/dL (ref 6.5–8.1)

## 2020-05-22 LAB — CBC
HCT: 31.1 % — ABNORMAL LOW (ref 36.0–46.0)
Hemoglobin: 9.5 g/dL — ABNORMAL LOW (ref 12.0–15.0)
MCH: 30 pg (ref 26.0–34.0)
MCHC: 30.5 g/dL (ref 30.0–36.0)
MCV: 98.1 fL (ref 80.0–100.0)
Platelets: 164 10*3/uL (ref 150–400)
RBC: 3.17 MIL/uL — ABNORMAL LOW (ref 3.87–5.11)
RDW: 14.9 % (ref 11.5–15.5)
WBC: 14.3 10*3/uL — ABNORMAL HIGH (ref 4.0–10.5)
nRBC: 0 % (ref 0.0–0.2)

## 2020-05-22 LAB — URINALYSIS, ROUTINE W REFLEX MICROSCOPIC
Bilirubin Urine: NEGATIVE
Glucose, UA: NEGATIVE mg/dL
Hgb urine dipstick: NEGATIVE
Ketones, ur: NEGATIVE mg/dL
Leukocytes,Ua: NEGATIVE
Nitrite: NEGATIVE
Protein, ur: NEGATIVE mg/dL
Specific Gravity, Urine: 1.014 (ref 1.005–1.030)
pH: 5 (ref 5.0–8.0)

## 2020-05-22 LAB — PREALBUMIN: Prealbumin: 12.4 mg/dL — ABNORMAL LOW (ref 18–38)

## 2020-05-22 LAB — C-REACTIVE PROTEIN: CRP: 11.2 mg/dL — ABNORMAL HIGH (ref <1.0)

## 2020-05-22 LAB — CREATININE, URINE, RANDOM: Creatinine, Urine: 172.21 mg/dL

## 2020-05-22 LAB — SODIUM, URINE, RANDOM: Sodium, Ur: <10 mmol/L

## 2020-05-22 LAB — SEDIMENTATION RATE: Sed Rate: 21 mm/hr (ref 0–22)

## 2020-05-22 MED ORDER — LACTATED RINGERS IV BOLUS
500.0000 mL | Freq: Once | INTRAVENOUS | Status: AC
  Administered 2020-05-22: 500 mL via INTRAVENOUS

## 2020-05-22 MED ORDER — SODIUM CHLORIDE 0.9 % IV SOLN: INTRAVENOUS | Status: DC

## 2020-05-22 MED ORDER — LACTATED RINGERS IV SOLN: INTRAVENOUS | Status: DC

## 2020-05-22 MED ORDER — BUSPIRONE HCL 5 MG PO TABS
15.0000 mg | ORAL_TABLET | Freq: Two times a day (BID) | ORAL | Status: DC
  Administered 2020-05-22 – 2020-05-24 (×4): 15 mg via ORAL
  Filled 2020-05-22 (×4): qty 3

## 2020-05-22 MED ORDER — SENNOSIDES-DOCUSATE SODIUM 8.6-50 MG PO TABS
2.0000 | ORAL_TABLET | Freq: Two times a day (BID) | ORAL | Status: DC
  Administered 2020-05-22 – 2020-05-24 (×4): 2 via ORAL
  Filled 2020-05-22 (×4): qty 2

## 2020-05-22 NOTE — Progress Notes (Signed)
Speech Language Pathology Daily Session Note  Patient Details  Name: [name] MRN: [id number] Date of Birth: 08/25/1940  Today's Date: 05/22/2020 SLP Individual Time: 1501-1530 SLP Individual Time Calculation (min): 29 min  Short Term Goals: Week 2: SLP Short Term Goal 1 (Week 2): Pt will utilize external memory aids to facilitate recall of daily information with min assist. SLP Short Term Goal 2 (Week 2): Pt will sustain attention to functional tasks with Min A cues for redirection. SLP Short Term Goal 3 (Week 2): Pt will demonstrate ability to problem solve basic functional situations with Mod A verbal/visual cues. SLP Short Term Goal 4 (Week 2): Pt will initiate in basic tasks with Mod A multimodal cues.  Skilled Therapeutic Interventions:Skilled ST services focused on education and cognitive skills. Per chart review SLP noted dramatic changes in affect and performance of cognitive since last seen by writer a week ago. Pt demonstrated fixed gaze to left side and no ability to scan to the right. Pt demonstrated very limited verbal output with 1 word answers. SLP communicated with nurse and messaged PA pertaining to need for CT scan. [name] entered room and pt continued flat affect while communicating with son, but increase production to 2-3 word phrases. SLP provided education pertaining to current cognitive deficits in attention, initiation, and problem solving. Pt's PA entered room and took over education, pertaining to cognitive changes attributed to changes in kidney function verse neurological. PA was speaking to pt's daughter in law on the phone when session ended. Pt was left in room with son and PA, call bell within reach and bed alarm set. SLP recommends to continue skilled services.        Pain Pain Assessment Pain Scale: 0-10 Pain Score: 0-No pain Pain Location: Arm Pain Orientation: Left  Therapy/Group: Individual Therapy  Rebeckah Masih  [facility] 05/22/2020, 3:32  PM

## 2020-05-22 NOTE — Progress Notes (Signed)
Occupational Therapy Weekly Progress Note  Patient Details  Name: [name] MRN: [id number] Date of Birth: 1941/02/20  Beginning of progress report period: May 14, 2020 End of progress report period: May 22, 2020  Today's Date: 05/22/2020 OT Individual Time: 0881-1031 OT Individual Time Calculation (min): 47 min  and Today's Date: 05/22/2020 OT Missed Time: 13 Minutes Missed Time Reason: Patient fatigue   Patient has met 0 of 3 short term goals.  Pt has made little to no progress towards goals due to minimal participation and decreased motivation.  Pt currently requires max-total assist for bed mobility and anywhere between min assist and max assist for sitting balance due to decreased initiation and sustained attention to task.  Pt requires max multimodal cues for initiation and sequencing of self-care tasks.  Pt has demonstrated sit > stand in [place] with mod assist 10/4 and 10/6 and stand pivot transfer with RW with mod assist on 10/4 with PT, but has refused OOB activity last several days.  Pt continues to c/o pain in LUE with edema s/p hematoma.  Have applied kinesiotape and educated on positioning for edema management.    Patient continues to demonstrate the following deficits: muscle weakness and muscle paralysis, decreased cardiorespiratoy endurance, decreased coordination and decreased motor planning, decreased visual perceptual skills, decreased midline orientation and decreased attention to right, decreased initiation, decreased attention, decreased problem solving, decreased safety awareness and decreased memory and decreased sitting balance, decreased standing balance, decreased postural control, hemiplegia and decreased balance strategies and therefore will continue to benefit from skilled OT intervention to enhance overall performance with BADL and Reduce care partner burden.  Patient progressing toward long term goals..  Continue plan of care.  OT Short Term  Goals Week 1:  OT Short Term Goal 1 (Week 1): Pt will complete UB bathing with Mod A OT Short Term Goal 1 - Progress (Week 1): Not met OT Short Term Goal 2 (Week 1): Pt will complete 1 grooming task while sitting at the sink with mod cuing for locating items in Rt visual field OT Short Term Goal 2 - Progress (Week 1): Not met OT Short Term Goal 3 (Week 1): Pt will thread 1 LE into pants with Mod A OT Short Term Goal 3 - Progress (Week 1): Not met Week 2:  OT Short Term Goal 1 (Week 2): Pt will complete UB bathing with Mod A OT Short Term Goal 2 (Week 2): Pt will complete 1 grooming task while sitting at the sink with mod cuing for locating items in Rt visual field OT Short Term Goal 3 (Week 2): Pt will thread 1 LE into pants with Mod A OT Short Term Goal 4 (Week 2): Pt will complete toilet transfer with mod assist  Skilled Therapeutic Interventions/Progress Updates:    Treatment session with focus on functional mobility and participation in treatment session.  Pt in bed with head turned to Lt upon arrival.  When greeted and asked how she was feeling, pt stated "I'm just here".  Pt passive throughout session, allowed therapist to roll her to side with max-total assist in preparation to transfer to sitting EOB.  Max assist sidelying to sitting.  Therapist providing lumbar support to facilitate more upright sitting posture with pt with no active participation in sitting balance.  Therapist turned on music for pt based on pt reported preferences to attempt to increase participation.  Pt still quite flat despite music.  Therapist provided hand over hand for initiation with washing face.  Pt with no active participation, just staring blankly in to space despite music and max encouragement. Pt returned to supine in bed total assist, +2 to scoot up in bed.  Therapist positioned LUE on pillows for improved positioning and edema management.    Therapist notified RN and PA of therapy concerns with decreased  motivation, attention.  Also requested neuropsych visit for coping, possible depression.  Therapy Documentation Precautions:  Precautions Precautions: Fall Restrictions Weight Bearing Restrictions: No General: General OT Amount of Missed Time: 13 Minutes Pain: Pain Assessment Pain Scale: 0-10 Pain Score: 0-No pain   Therapy/Group: Individual Therapy  Simonne Come 05/22/2020, 12:09 PM

## 2020-05-22 NOTE — Progress Notes (Addendum)
[facility] PHYSICAL MEDICINE & REHABILITATION PROGRESS NOTE   Subjective/Complaints: Patient seen sitting up in bed this morning.  She states she slept well overnight.  She states she had a good weekend.  ROS: Denies CP, SOB, N/V/D  Objective:   VAS [place] UPPER EXTREMITY VENOUS DUPLEX  Result Date: 05/21/2020 UPPER VENOUS STUDY  Indications: Swelling, and Pain Limitations: Patient position, restricted mobility. Comparison Study: No prior study Performing Technologist: Gertie Fey MHA, RDMS, RVT, RDCS  Examination Guidelines: A complete evaluation includes B-mode imaging, spectral Doppler, color Doppler, and power Doppler as needed of all accessible portions of each vessel. Bilateral testing is considered an integral part of a complete examination. Limited examinations for reoccurring indications may be performed as noted.  Left Findings: +----------+------------+---------+-----------+----------+--------------+ LEFT      CompressiblePhasicitySpontaneousProperties   Summary     +----------+------------+---------+-----------+----------+--------------+ IJV                                                 Not visualized +----------+------------+---------+-----------+----------+--------------+ Subclavian    Full       Yes       Yes                             +----------+------------+---------+-----------+----------+--------------+ Axillary                                            Not visualized +----------+------------+---------+-----------+----------+--------------+ Brachial      Full       Yes       Yes                             +----------+------------+---------+-----------+----------+--------------+ Radial        Full                                                 +----------+------------+---------+-----------+----------+--------------+ Ulnar                                               Not visualized  +----------+------------+---------+-----------+----------+--------------+ Cephalic      Full                                                 +----------+------------+---------+-----------+----------+--------------+ Basilic                                             Not visualized +----------+------------+---------+-----------+----------+--------------+  Summary:  Left: No obvious evidence of deep or superficial vein thrombosis involving the visualized veins of the left upper extremity. There is a mostly cystic area of the left medial mid upper arm measuring at least 5.2cm; suggestive of possible hematoma versus seroma  versus unknown etiology.  *See table(s) above for measurements and observations.  Diagnosing physician: Lemar Livings MD Electronically signed by Lemar Livings MD on 05/21/2020 at 11:33:08 AM.    Final    Recent Labs    05/22/20 0541  WBC 14.3*  HGB 9.5*  HCT 31.1*  PLT 164   Recent Labs    05/22/20 0541  NA 137  K 4.8  CL 103  CO2 22  GLUCOSE 138*  BUN 83*  CREATININE 2.69*  CALCIUM 8.6*    Intake/Output Summary (Last 24 hours) at 05/22/2020 1222 Last data filed at 05/22/2020 0819 Gross per 24 hour  Intake 605 ml  Output --  Net 605 ml        Physical Exam: Vital Signs Blood pressure (!) 115/53, pulse 73, temperature 98.7 F (37.1 C), temperature source Oral, resp. rate 18, height 5\' 2"  (1.575 m), weight 93 kg, SpO2 93 %.  Constitutional: No distress . Vital signs reviewed. HENT: Normocephalic.  Atraumatic. Eyes: Limited left gaze.  No discharge. Cardiovascular: No JVD.  RRR. Respiratory: Normal effort.  No stridor.  Bilateral clear to auscultation. GI: Non-distended.  BS +. Skin: Warm and dry.  Intact. Psych: Flat. Musc: Left upper extremity with edema and tenderness,?  Some improvement Neuro: Alert and oriented Motor: Left upper extremity: Shoulder abduction 4 -/5, elbow flexion/extension 2/5, wrist extension 0/5, handgrip 2/5, some pain  inhibition Left lower extremity: 4/5 proximal to distal Right upper extremity: 4/5 proximal distal  Assessment/Plan: 1. Functional deficits secondary to Debility following submassive PE with septic emboli and right heart strain, which require 3+ hours per day of interdisciplinary therapy in a comprehensive inpatient rehab setting.  Physiatrist is providing close team supervision and 24 hour management of active medical problems listed below.  Physiatrist and rehab team continue to assess barriers to discharge/monitor patient progress toward functional and medical goals  Care Tool:  Bathing    Body parts bathed by patient: Chest, Abdomen, Front perineal area, Right upper leg, Left upper leg, Face   Body parts bathed by helper: Right arm, Left arm, Abdomen, Front perineal area, Buttocks, Right upper leg, Left upper leg, Right lower leg, Left lower leg Body parts n/a: Right arm, Left arm, Buttocks, Right lower leg, Left lower leg   Bathing assist Assist Level: Moderate Assistance - Patient 50 - 74%     Upper Body Dressing/Undressing Upper body dressing   What is the patient wearing?: Pull over shirt    Upper body assist Assist Level: Maximal Assistance - Patient 25 - 49%    Lower Body Dressing/Undressing Lower body dressing      What is the patient wearing?: Pants     Lower body assist Assist for lower body dressing: Total Assistance - Patient < 25%     Toileting Toileting    Toileting assist Assist for toileting: Total Assistance - Patient < 25%     Transfers Chair/bed transfer  Transfers assist     Chair/bed transfer assist level: Moderate Assistance - Patient 50 - 74%     Locomotion Ambulation   Ambulation assist      Assist level: 2 helpers Assistive device: Walker-rolling Max distance: 48ft   Walk 10 feet activity   Assist     Assist level: 2 helpers Assistive device: Walker-rolling   Walk 50 feet activity   Assist Walk 50 feet with 2  turns activity did not occur: Safety/medical concerns (fatigue, generalized weakness, decreased balance, pain)  Walk 150 feet activity   Assist Walk 150 feet activity did not occur: Safety/medical concerns (fatigue, generalized weakness, decreased balance, pain)         Walk 10 feet on uneven surface  activity   Assist Walk 10 feet on uneven surfaces activity did not occur: Safety/medical concerns (fatigue, generalized weakness, decreased balance, pain)         Wheelchair     Assist Will patient use wheelchair at discharge?: Yes Type of Wheelchair: Manual    Wheelchair assist level: Supervision/Verbal cueing Max wheelchair distance: 73ft    Wheelchair 50 feet with 2 turns activity    Assist        Assist Level: Total Assistance - Patient < 25%   Wheelchair 150 feet activity     Assist      Assist Level: Total Assistance - Patient < 25%   Blood pressure (!) 115/53, pulse 73, temperature 98.7 F (37.1 C), temperature source Oral, resp. rate 18, height 5\' 2"  (1.575 m), weight 93 kg, SpO2 93 %.  Medical Assessment/Plan: 1.Debility following submassive PE with septic emboli and right heart strain.   Continue CIR   CT Head negative, appreciate neurology eval.  2. Antithrombotics: -DVT/anticoagulation:IVC filter  Left upper extremity Doppler showing cystic structure 5.2 cm- seroma vs hematoma per Dopplers -antiplatelet therapy: N/A 3. Pain Management:oxycodone PRN for low back pain. Continue heating pad and lidocaine patch  Continue warm compress  ?CRPS - will consider meds if no improvement, encourage ROM  Controlled with meds on 10/11 4. Mood:Provide emotional support. Denies depression but would like to continue on Buspar.  -antipsychotic agents: N/A 5. Neuropsych: This patientisnot fully capable of making decisions on herown behalf. 6. Skin/Wound care: Continue warm compress to LUE hematoma 7.  Fluids/Electrolytes/Nutrition:Routine in and outs.  Remeron started appetite on 10/8, will consider increase after bowel movement 8. Dementia: Continue Aricept and Memantine. Placed order for recreational therapy to bring in board games, which she enjoys.  9. Right hip cellulitis: continue completed course of Keflex  Vancomycin DC'd 10.  Constipation: Has BM every three days PTA. Continue Linzess, Senna-docusate HS  Bowel meds increased on 10/11 11.  Sleep disturbance: Continue Melatonin HS.   Avoid trazodone given #17  Ambien 2.5 nightly as needed ordered on 10/5  Improving 12. Dry eyes: Continue drops.  13.  Hypoalbuminemia  Supplement initiated on 10/4 14.  Mild transaminitis: Resolved  LFTs within normal was on 10/11 15.  Leukocytosis:   WBCs 14.3 on 10/11-likely some component of concentration  Afebrile  See #9  Continue to monitor 16.  Acute blood loss anemia  Hemoglobin 9.5 on 10/11,?  Concentration, labs ordered for tomorrow  Continue to monitor 17.  Prolonged QTc  ECG reviewed from 9/29 showing severe prolongation in QT, repeat ECG reviewed, showing improvement in QTc 18.  AKI  Creatinine 2.69 on 10/11, labs ordered for tomorrow  Echo reviewed, EF of 60 to 65%  IVF initiated  Discussed with pharmacy, lisinopril on hold. 19.  Left upper extremity edema  See #6, #3, #2  Will consider furosemide after improvement in renal function  LOS: 9 days A FACE TO FACE EVALUATION WAS PERFORMED  [name] 12/11 05/22/2020, 12:22 PM

## 2020-05-22 NOTE — Progress Notes (Signed)
Attempted IV stick x 1 without success. IV team consult ordered.

## 2020-05-22 NOTE — Plan of Care (Signed)
Problem: RH Balance Goal: LTG Patient will maintain dynamic standing balance (PT) Description: LTG:  Patient will maintain dynamic standing balance with assistance during mobility activities (PT) Flowsheets (Taken 05/22/2020 0746) LTG: Pt will maintain dynamic standing balance during mobility activities with:: (downgraded due to poor activity tolerance, generalized weakness, and decreased balance) Contact Guard/Touching assist Note: downgraded due to poor activity tolerance, generalized weakness, and decreased balance   Problem: Sit to Stand Goal: LTG:  Patient will perform sit to stand with assistance level (PT) Description: LTG:  Patient will perform sit to stand with assistance level (PT) Flowsheets (Taken 05/22/2020 0746) LTG: PT will perform sit to stand in preparation for functional mobility with assistance level: (downgraded due to poor activity tolerance, generalized weakness, and decreased balance) Contact Guard/Touching assist Note: downgraded due to poor activity tolerance, generalized weakness, and decreased balance   Problem: RH Bed Mobility Goal: LTG Patient will perform bed mobility with assist (PT) Description: LTG: Patient will perform bed mobility with assistance, with/without cues (PT). Flowsheets (Taken 05/22/2020 0746) LTG: Pt will perform bed mobility with assistance level of: (downgraded due to poor activity tolerance, generalized weakness, and decreased motivation) Minimal Assistance - Patient > 75% Note: downgraded due to poor activity tolerance, generalized weakness, and decreased motivation   Problem: RH Bed to Chair Transfers Goal: LTG Patient will perform bed/chair transfers w/assist (PT) Description: LTG: Patient will perform bed to chair transfers with assistance (PT). Flowsheets (Taken 05/22/2020 0746) LTG: Pt will perform Bed to Chair Transfers with assistance level: (downgraded due to poor activity tolerance, generalized weakness, and decreased balance)  Contact Guard/Touching assist Note: downgraded due to poor activity tolerance, generalized weakness, and decreased balance   Problem: RH Car Transfers Goal: LTG Patient will perform car transfers with assist (PT) Description: LTG: Patient will perform car transfers with assistance (PT). Flowsheets (Taken 05/22/2020 0746) LTG: Pt will perform car transfers with assist:: (downgraded due to poor activity tolerance, generalized weakness, and decreased balance) Minimal Assistance - Patient > 75% Note: downgraded due to poor activity tolerance, generalized weakness, and decreased balance   Problem: RH Ambulation Goal: LTG Patient will ambulate in controlled environment (PT) Description: LTG: Patient will ambulate in a controlled environment, # of feet with assistance (PT). Flowsheets (Taken 05/22/2020 0746) LTG: Pt will ambulate in controlled environ  assist needed:: (downgraded due to poor activity tolerance, generalized weakness, and decreased balance) Minimal Assistance - Patient > 75% LTG: Ambulation distance in controlled environment: 41ft with LRAD Note: downgraded due to poor activity tolerance, generalized weakness, and decreased balance Goal: LTG Patient will ambulate in home environment (PT) Description: LTG: Patient will ambulate in home environment, # of feet with assistance (PT). Flowsheets (Taken 05/22/2020 0746) LTG: Pt will ambulate in home environ  assist needed:: (downgraded due to poor activity tolerance, generalized weakness, and decreased balance) Minimal Assistance - Patient > 75% LTG: Ambulation distance in home environment: 43ft with LRAD Note: downgraded due to poor activity tolerance, generalized weakness, and decreased balance   Problem: RH Wheelchair Mobility Goal: LTG Patient will propel w/c in controlled environment (PT) Description: LTG: Patient will propel wheelchair in controlled environment, # of feet with assist (PT) Flowsheets (Taken 05/22/2020 0746) LTG: Pt  will propel w/c in controlled environ  assist needed:: (downgraded due to poor activity tolerance, generalized weakness, and poor motivation) Supervision/Verbal cueing LTG: Propel w/c distance in controlled environment: 7ft Note: downgraded due to poor activity tolerance, generalized weakness, and poor motivation Goal: LTG Patient will propel w/c in home environment (PT)  Description: LTG: Patient will propel wheelchair in home environment, # of feet with assistance (PT). Flowsheets (Taken 05/22/2020 0746) LTG: Pt will propel w/c in home environ  assist needed:: (downgraded due to poor activity tolerance, generalized weakness, and poor motivation) Supervision/Verbal cueing LTG: Propel w/c distance in home environment: 27ft Note: downgraded due to poor activity tolerance, generalized weakness, and poor motivation

## 2020-05-22 NOTE — Progress Notes (Signed)
Patient does not wear CPAP at home. Declined use at this time.

## 2020-05-22 NOTE — Progress Notes (Signed)
Physical Therapy Weekly Progress Note  Patient Details  Name: [name] MRN: [id number] Date of Birth: 1940/09/15  Beginning of progress report period: May 15, 2020 End of progress report period: May 22, 2020  Today's Date: 05/22/2020 PT Individual Time: 1300-1320 PT Individual Time Calculation (min): 20 min  Today's Date: 05/22/2020 PT Missed Time: 25 Minutes Missed Time Reason: Other (Comment);Patient fatigue (call for rapid response)  Patient has met 0 of 3 short term goals. Pt demonstrates very minimal progress towards long term goals and recently downgraded to CGA/min A overall. Pt currently requires mod/max A for bed mobility, max A to transfer sit<>stand and mod A to transfer stand<>pivot with RW, mod A +2 to ambulate 43f with RW (although pt has been unable to ambulate since eval), and supervision for WC mobility up to 148f Pt continues to be limited by pain/edema in L UE, depression, fatigue and poor motivation to participate in therapy sessions, and generalized weakness.   Patient continues to demonstrate the following deficits muscle weakness, decreased cardiorespiratoy endurance, decreased problem solving, decreased safety awareness and decreased memory and decreased sitting balance, decreased standing balance, decreased postural control and decreased balance strategies and therefore will continue to benefit from skilled PT intervention to increase functional independence with mobility.  Patient progressing toward long term goals..  Continue plan of care.  PT Short Term Goals Week 1:  PT Short Term Goal 1 (Week 1): Pt will perform bed mobility with mod A consistantly PT Short Term Goal 1 - Progress (Week 1): Progressing toward goal PT Short Term Goal 2 (Week 1): Pt will transfer sit<>stand with LRAD min A PT Short Term Goal 2 - Progress (Week 1): Progressing toward goal PT Short Term Goal 3 (Week 1): Pt will perform simulated car transfer with RW and mod A PT  Short Term Goal 3 - Progress (Week 1): Progressing toward goal Week 2:  PT Short Term Goal 1 (Week 2): Pt will perform bed mobility with mod A consistantly PT Short Term Goal 2 (Week 2): Pt will transfer sit<>stand with LRAD min A PT Short Term Goal 3 (Week 2): Pt will perform simulated car transfer with RW and mod A  Skilled Therapeutic Interventions/Progress Updates:  Ambulation/gait training;DME/adaptive equipment instruction;Psychosocial support;UE/LE Strength taining/ROM;Balance/vestibular training;Functional electrical stimulation;Skin care/wound management;UE/LE Coordination activities;Cognitive remediation/compensation;Functional mobility training;Splinting/orthotics;Community reintegration;Neuromuscular re-education;Stair training;Wheelchair propulsion/positioning;Discharge planning;Pain management;Therapeutic Activities;Disease management/prevention;Patient/family education;Therapeutic Exercise   Today's Interventions: Received pt supine in bed, noted pt with glazed appearance, flat affect, and clear signs of depression. Pt able to respond to therapist's questions approximately 75% of the time during conversation but avoiding eye contact or intentional movement. When asked how pt was doing she responded "I'm doing". Pt stated she slept well last night but declined participating in therapy. RN present to start IV and therapist discussed pt's presentation with RN. Charge nurse notified and present to assess pt, ultimately deciding to call rapid response due to pt's glazed appearance, lack of pupil dilation and ability to visually track objects, and flat affect. This therapist notified PA, DaLinna Hoffof pt's current status and decision to call rapid response. Contacted CSW to ensure Neuropsych appointment set up for pt in the future. Concluded session with pt supine in bed, needs within reach, and bed alarm on. 25 minutes missed of skilled physical therapy due to fatigue and call for rapid response.    Therapy Documentation Precautions:  Precautions Precautions: Fall Restrictions Weight Bearing Restrictions: No  Therapy/Group: Individual Therapy AnFarrellT,  DPT   05/22/2020, 7:39 AM

## 2020-05-22 NOTE — Progress Notes (Signed)
Speech Language Pathology Weekly Progress and Session Note  Patient Details  Name: [name] MRN: [id number] Date of Birth: 22-Oct-1940  Beginning of progress report period: May 14, 2020 End of progress report period: May 22, 2020  Today's Date: 05/22/2020 SLP Individual Time: 8811-0315 SLP Individual Time Calculation (min): 58 min  Short Term Goals: Week 1: SLP Short Term Goal 1 (Week 1): Pt will utilize external memory aids to facilitate recall of daily information with min assist. SLP Short Term Goal 1 - Progress (Week 1): Progressing toward goal SLP Short Term Goal 2 (Week 1): Pt will direct her care during basic tasks with min assist. SLP Short Term Goal 2 - Progress (Week 1): Progressing toward goal    New Short Term Goals: Week 2: SLP Short Term Goal 1 (Week 2): Pt will utilize external memory aids to facilitate recall of daily information with min assist. SLP Short Term Goal 2 (Week 2): Pt will sustain attention to functional tasks with Min A cues for redirection. SLP Short Term Goal 3 (Week 2): Pt will demonstrate ability to problem solve basic functional situations with Mod A verbal/visual cues. SLP Short Term Goal 4 (Week 2): Pt will initiate in basic tasks with Mod A multimodal cues.  Weekly Progress Updates: Pt has made slow progress and minimal gains this week, not meeting any of her short term goals for this reporting peroid. She currently requires Mod-Max assist for basic familiar tasks due to cognitive impairment impacting her initiation, arousal, problem solving, attention, and short term memory. After admission to CIR, pt's medical stability and participation in therapies has fluctuated. It is suspected pt may have had TIA shortly after admission, and since then her cognitive functioning has severely declined, and we are focusing on basic tasks. Pt education is ongoing; no family has been present for ST sessions. Pt would continue to benefit from skilled ST  while inpatient in order to maximize functional independence and reduce burden of care prior to discharge. Anticipate that pt will need 24/7 supervision at discharge in addition to ST follow up at next level of care.       Intensity: Minumum of 1-2 x/day, 30 to 90 minutes Frequency: 3 to 5 out of 7 days Duration/Length of Stay: 06/03/20 Treatment/Interventions: Cognitive remediation/compensation;Cueing hierarchy;Functional tasks;Internal/external aids;Patient/family education;Therapeutic Activities   Daily Session  Skilled Therapeutic Interventions: Pt was seen for skilled ST targeting cognitive goals. Pt was very lethargic during session, and became increasingly so as time passed. She was initially more engaged, although required Mod A to use calendar to orient to time and use memory notebook in any functional way. Attempted to engage pt in basic money management and then sorting task, however she required Max increasing to Total A hand over hand assist to sort and attend to tasks in a quiet environment. She denied pain or feeling upset, just reported lethargy and desire to sleep. SLP provided consistent encourangement to particate and rationale behind importance of CIR therapies to return home at most independent level of functioning. She required hand over hand assist to iniiate in basic structured and self care tasks. Switched to verbal tasks to maximize particiation, initiation, and sustained attention. Pt required Max A verbal cues to name approximately 5 animals during a divergent naming task. Memory notebook updated prior to end of sessoin. Pt left laying in bed with alarm set and needs within reach. Continue per current plan of care.            Pain  Pain Assessment Pain Scale: 0-10 Pain Score: 0-No pain  Therapy/Group: Individual Therapy  Little Ishikawa 05/22/2020, 9:40 AM

## 2020-05-22 NOTE — Consult Note (Signed)
[name] Admit Date: 05/13/2020 05/22/2020 [name] Requesting Physician:  Allena Katz MD  Reason for Consult:  AKI HPI:  59F initially presented on 9/24 with near syncope and found to have submassive acute PE and subsequently received TPA therapy.  Course was complicated by left arm hematoma, AV LA, discontinuation of anticoagulation and placement of IVC filter.  She developed progressive debility and was transferred to inpatient rehabilitation on 10/2.  Other past history includes Takotsubo cardiomyopathy, hypertension on lisinopril 40 mg daily, OSA.  Over the past several days she has been increasingly lethargic and not participatory in CIR.  She has not taken in significant p.o.  Lisinopril has been continued.  On 10/3 she had normal GFR and then on labs today her creatinine was 2.7 with K4.8.  She has been placed on normal saline at 75 mL/h.  Lisinopril is been discontinued.  Son [name] is at bedside, states that mother is more lethargic and somnolent than usual.   Creatinine, Ser (mg/dL)  Date Value  75/05/2584 2.69 (H)  05/14/2020 0.73  05/13/2020 0.77  05/12/2020 0.72  05/11/2020 0.70  05/10/2020 0.72  05/09/2020 0.82  05/06/2020 0.83  05/05/2020 1.13 (H)  03/09/2020 1.03 (H)  ] I/Os: UOP not measured  ROS NSAIDS: No identified exposure IV Contrast no identified exposure TMP/SMX no identified exposure Hypotension not present Balance of 12 systems is negative w/ exceptions as above  PMH  Past Medical History:  Diagnosis Date  . Arthritis   . Asthma   . Chronic back pain   . Depression   . History of pulmonary embolus (PE)   . Hx-TIA (transient ischemic attack)   . Hyperlipemia   . Hypertension   . Personal history of DVT (deep vein thrombosis)   . Scoliosis   . Sleep apnea    cpap - settingsi at 3   . 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   PSH  Past Surgical History:  Procedure Laterality Date  . ARTHROSCOPIC REPAIR ACL  02/03/2004  . IR IVC  FILTER PLMT / S&I /IMG GUID/MOD SED  05/10/2020  . LEFT HEART CATH AND CORONARY ANGIOGRAPHY N/A 09/16/2019   Procedure: LEFT HEART CATH AND CORONARY ANGIOGRAPHY;  Surgeon: Yvonne Kendall, MD;  Location: [facility] INVASIVE CV LAB;  Service: Cardiovascular;  Laterality: N/A;  . LOOP RECORDER INSERTION N/A 02/21/2020   Procedure: LOOP RECORDER INSERTION;  Surgeon: Duke Salvia, MD;  Location: [facility] INVASIVE CV LAB;  Service: Cardiovascular;  Laterality: N/A;  . OOPHORECTOMY  1970  . SPINE SURGERY  03/31/2006  . TOTAL KNEE ARTHROPLASTY Left 05/17/2016   Procedure: LEFT TOTAL KNEE ARTHROPLASTY;  Surgeon: Kathryne Hitch, MD;  Location: [facility] ORS;  Service: Orthopedics;  Laterality: Left;  Adductor Block; Spinal to General  . TUBAL LIGATION  1983   FH  Family History  Problem Relation Age of Onset  . Other Father        kidney problems   SH  reports that she quit smoking about 46 years ago. Her smoking use included cigarettes. She has a 10.00 pack-year smoking history. She has never used smokeless tobacco. She reports that she does not drink alcohol and does not use drugs. Allergies  Allergies  Allergen Reactions  . T-Pa [Alteplase] Swelling    Does okay with it if premedicated with benadryl.  . Codeine Itching   Home medications Prior to Admission medications   Medication Sig Start Date End Date Taking? Authorizing Provider  acetaminophen (TYLENOL) 325 MG tablet Take 2 tablets (650  mg total) by mouth every 4 (four) hours as needed for mild pain (or temp > 37.5 C (99.5 F)). 02/21/20   Layne Benton, NP  amLODipine (NORVASC) 10 MG tablet Take 10 mg by mouth daily. 03/02/20   [provider]  artificial tears (LACRILUBE) OINT ophthalmic ointment Place into the left eye at bedtime. Apply left eye bedtime 03/10/20   Angiulli, Mcarthur Rossetti, PA-C  aspirin EC 81 MG EC tablet Take 1 tablet (81 mg total) by mouth daily. 09/18/19   Duke, Roe Rutherford, PA  atorvastatin (LIPITOR) 20 MG tablet Take 1 tablet  (20 mg total) by mouth at bedtime. 03/09/20   Angiulli, Mcarthur Rossetti, PA-C  busPIRone (BUSPAR) 30 MG tablet Take 1 tablet (30 mg total) by mouth 2 (two) times daily. 04/11/20   Cherie Ouch, PA-C  cholecalciferol (VITAMIN D3) 25 MCG (1000 UNIT) tablet Take 1 tablet (1,000 Units total) by mouth daily. 03/09/20   Angiulli, Mcarthur Rossetti, PA-C  cycloSPORINE (RESTASIS) 0.05 % ophthalmic emulsion Place 1 drop into both eyes daily as needed (dry eyes). 02/21/20   Layne Benton, NP  diphenhydramine-acetaminophen (TYLENOL PM) 25-500 MG TABS tablet Take 1 tablet by mouth at bedtime as needed.    [provider]  donepezil (ARICEPT) 10 MG tablet Take 1 tablet (10 mg total) by mouth at bedtime. 03/09/20   Angiulli, Mcarthur Rossetti, PA-C  LINZESS 145 MCG CAPS capsule Take 145 mcg by mouth daily. 04/16/20   [provider]  lisinopril (ZESTRIL) 40 MG tablet Take 40 mg by mouth daily. 04/10/20   [provider]  melatonin 3 MG TABS tablet Take 1 tablet (3 mg total) by mouth at bedtime. 03/09/20   Angiulli, Mcarthur Rossetti, PA-C  memantine (NAMENDA XR) 28 MG CP24 24 hr capsule Take 1 capsule (28 mg total) by mouth daily. 03/09/20   Angiulli, Mcarthur Rossetti, PA-C  Multiple Vitamins-Minerals (MULTI-VITAMIN GUMMIES PO) Take 1 each by mouth daily.     [provider]  nitroGLYCERIN (NITROSTAT) 0.4 MG SL tablet Place 1 tablet (0.4 mg total) under the tongue every 5 (five) minutes as needed for chest pain. 09/17/19   Duke, Roe Rutherford, PA  oxyCODONE (ROXICODONE) 15 MG immediate release tablet Take 1 tablet (15 mg total) by mouth 4 (four) times daily as needed (chronic pain). 03/09/20   Angiulli, Mcarthur Rossetti, PA-C  polyethylene glycol (MIRALAX / GLYCOLAX) 17 g packet Take 17 g by mouth 2 (two) times daily. 03/09/20   Angiulli, Mcarthur Rossetti, PA-C  polyvinyl alcohol (LIQUIFILM TEARS) 1.4 % ophthalmic solution Place 2 drops into the left eye 4 (four) times daily. 02/21/20   Layne Benton, NP  senna-docusate (SENOKOT-S) 8.6-50 MG  tablet Take 1 tablet by mouth at bedtime. 05/13/20   Dimple Nanas, MD    Current Medications Scheduled Meds: . (feeding supplement) PROSource Plus  30 mL Oral BID BM  . amLODipine  10 mg Oral Daily  . artificial tears   Left Eye QHS  . aspirin EC  81 mg Oral Daily  . atorvastatin  20 mg Oral QHS  . busPIRone  15 mg Oral BID  . cholecalciferol  1,000 Units Oral Daily  . donepezil  10 mg Oral QHS  . lidocaine  1 patch Transdermal Q24H  . linaclotide  145 mcg Oral QAC breakfast  . melatonin  3 mg Oral QHS  . memantine  28 mg Oral Daily  . mirtazapine  15 mg Oral QHS  . multivitamins with iron  1 tablet Oral Daily  . polyethylene glycol  17 g Oral BID  . polyvinyl alcohol  4 drop Left Eye Daily  . senna-docusate  2 tablet Oral BID   Continuous Infusions: . sodium chloride 75 mL/hr at 05/22/20 1606   PRN Meds:.acetaminophen, alum & mag hydroxide-simeth, cycloSPORINE, nitroGLYCERIN, ondansetron **OR** ondansetron, oxyCODONE, zolpidem  CBC Recent Labs  Lab 05/18/20 0553 05/19/20 1147 05/22/20 0541  WBC 9.5 9.4 14.3*  HGB 9.2* 9.7* 9.5*  HCT 30.3* 31.6* 31.1*  MCV 99.0 99.7 98.1  PLT 198 192 164   Basic Metabolic Panel Recent Labs  Lab 05/22/20 0541  NA 137  K 4.8  CL 103  CO2 22  GLUCOSE 138*  BUN 83*  CREATININE 2.69*  CALCIUM 8.6*    Physical Exam  Blood pressure (!) 113/55, pulse 72, temperature 97.6 F (36.4 C), temperature source Oral, resp. rate 20, height 5\' 2"  (1.575 m), weight 93 kg, SpO2 100 %. GEN: Elderly female, chronically ill-appearing, NAD, lethargic and somnolent, answers to voice ENT: NCAT, mild temporal wasting EYES: Sunken eyes, EOMI CV: Regular, normal S1-S2 PULM: Coarse breath sounds bilaterally ABD: Soft, nontender SKIN: No rashes, lesions, petechia, purpura EXT: No edema  Assessment 69F AKI in setting of poor oral intake, continuation of ACE inhibitor  1. AKI, suspect is hypovolemia+ACEi.  Lisinopril has been held.  Would be  more aggressive with volume repletion, will provide LR bolus, 0.5 L, ongoing rate of 100 mL/h overnight. 2. Debility following submassive PE complicated by acute blood loss anemia and forearm hematoma, in CIR 3. Confusion/encephalopathy 4. Hypertension, was on ACE inhibitor, held  Plan 1. Agree with holding ACEi 2. LR 0.5L bolus now, then 163mL/h overnight 3. UA, UNa and Creat 4. Renal 80m 5. Daily weights, Daily Renal Panel, Strict I/Os, Avoid nephrotoxins (NSAIDs, judicious IV Contrast)    [place]  05/22/2020, 4:50 PM

## 2020-05-23 ENCOUNTER — Inpatient Hospital Stay (HOSPITAL_COMMUNITY): Payer: Medicare Other

## 2020-05-23 ENCOUNTER — Inpatient Hospital Stay (HOSPITAL_COMMUNITY): Payer: Medicare Other | Admitting: Occupational Therapy

## 2020-05-23 ENCOUNTER — Encounter (HOSPITAL_COMMUNITY): Payer: Medicare Other | Admitting: Psychology

## 2020-05-23 ENCOUNTER — Inpatient Hospital Stay: Payer: Medicare Other | Admitting: Adult Health

## 2020-05-23 LAB — URINE CULTURE: Culture: NO GROWTH

## 2020-05-23 LAB — CBC WITH DIFFERENTIAL/PLATELET
Abs Immature Granulocytes: 0.06 10*3/uL (ref 0.00–0.07)
Basophils Absolute: 0 10*3/uL (ref 0.0–0.1)
Basophils Relative: 0 %
Eosinophils Absolute: 0.1 10*3/uL (ref 0.0–0.5)
Eosinophils Relative: 1 %
HCT: 29.7 % — ABNORMAL LOW (ref 36.0–46.0)
Hemoglobin: 9.2 g/dL — ABNORMAL LOW (ref 12.0–15.0)
Immature Granulocytes: 1 %
Lymphocytes Relative: 9 %
Lymphs Abs: 1.2 10*3/uL (ref 0.7–4.0)
MCH: 30.6 pg (ref 26.0–34.0)
MCHC: 31 g/dL (ref 30.0–36.0)
MCV: 98.7 fL (ref 80.0–100.0)
Monocytes Absolute: 1.2 10*3/uL — ABNORMAL HIGH (ref 0.1–1.0)
Monocytes Relative: 9 %
Neutro Abs: 10.7 10*3/uL — ABNORMAL HIGH (ref 1.7–7.7)
Neutrophils Relative %: 80 %
Platelets: 158 10*3/uL (ref 150–400)
RBC: 3.01 MIL/uL — ABNORMAL LOW (ref 3.87–5.11)
RDW: 15 % (ref 11.5–15.5)
WBC: 13.3 10*3/uL — ABNORMAL HIGH (ref 4.0–10.5)
nRBC: 0 % (ref 0.0–0.2)

## 2020-05-23 LAB — BASIC METABOLIC PANEL
Anion gap: 10 (ref 5–15)
BUN: 86 mg/dL — ABNORMAL HIGH (ref 8–23)
CO2: 24 mmol/L (ref 22–32)
Calcium: 8.4 mg/dL — ABNORMAL LOW (ref 8.9–10.3)
Chloride: 103 mmol/L (ref 98–111)
Creatinine, Ser: 2.56 mg/dL — ABNORMAL HIGH (ref 0.44–1.00)
GFR, Estimated: 17 mL/min — ABNORMAL LOW (ref >60)
Glucose, Bld: 122 mg/dL — ABNORMAL HIGH (ref 70–99)
Potassium: 4.7 mmol/L (ref 3.5–5.1)
Sodium: 137 mmol/L (ref 135–145)

## 2020-05-23 MED ORDER — MIRTAZAPINE 15 MG PO TABS
30.0000 mg | ORAL_TABLET | Freq: Every day | ORAL | Status: DC
  Administered 2020-05-23: 30 mg via ORAL
  Filled 2020-05-23: qty 2

## 2020-05-23 MED ORDER — METOCLOPRAMIDE HCL 5 MG PO TABS
5.0000 mg | ORAL_TABLET | Freq: Three times a day (TID) | ORAL | Status: DC
  Administered 2020-05-23 – 2020-05-24 (×4): 5 mg via ORAL
  Filled 2020-05-23 (×4): qty 1

## 2020-05-23 NOTE — Plan of Care (Signed)
°  Problem: RH Memory Goal: LTG Patient will use memory compensatory aids to (SLP) Description: LTG:  Patient will use memory compensatory aids to recall biographical/new, daily complex information with cues (SLP) Flowsheets (Taken 05/23/2020 1600) LTG: Patient will use memory compensatory aids to (SLP): (downgraded due to limited participation and medical change) Maximal Assistance - Patient 25 - 49% Note: Downgraded due to medical change and limited participation   Problem: RH Problem Solving Goal: LTG Patient will demonstrate problem solving for (SLP) Description: LTG:  Patient will demonstrate problem solving for basic/complex daily situations with cues  (SLP) Flowsheets (Taken 05/23/2020 1600) LTG: Patient will demonstrate problem solving for (SLP): Basic daily situations LTG Patient will demonstrate problem solving for: (downgraded due to limited participation and medical changes) Maximal Assistance - Patient 25 - 49% Note: Downgraded due to limited participation and medical changes    Problem: RH Attention Goal: LTG Patient will demonstrate this level of attention during functional activites (SLP) Description: LTG:  Patient will will demonstrate this level of attention during functional activites (SLP) Flowsheets (Taken 05/23/2020 1600) Patient will demonstrate during cognitive/linguistic activities the attention type of: Sustained Patient will demonstrate this level of attention during cognitive/linguistic activities in: Controlled LTG: Patient will demonstrate this level of attention during cognitive/linguistic activities with assistance of (SLP): (downgraded due to limited participation and medical changes) Maximal Assistance - Patient 25 - 49% Number of minutes patient will demonstrate attention during cognitive/linguistic activities: 3-5 minutes Note: Downgraded due to limited participation and medical changes

## 2020-05-23 NOTE — Progress Notes (Signed)
Physical Therapy Session Note  Patient Details  Name: [name] MRN: [id number] Date of Birth: March 05, 1941  Today's Date: 05/23/2020 PT Individual Time: 6834-1962 PT Individual Time Calculation (min): 33 min   Today's Date: 05/23/2020 PT Missed Time: 27 Minutes Missed Time Reason: Patient unwilling to participate;Patient fatigue  Short Term Goals: Week 1:  PT Short Term Goal 1 (Week 1): Pt will perform bed mobility with mod A consistantly PT Short Term Goal 1 - Progress (Week 1): Progressing toward goal PT Short Term Goal 2 (Week 1): Pt will transfer sit<>stand with LRAD min A PT Short Term Goal 2 - Progress (Week 1): Progressing toward goal PT Short Term Goal 3 (Week 1): Pt will perform simulated car transfer with RW and mod A PT Short Term Goal 3 - Progress (Week 1): Progressing toward goal Week 2:  PT Short Term Goal 1 (Week 2): Pt will perform bed mobility with mod A consistantly PT Short Term Goal 2 (Week 2): Pt will transfer sit<>stand with LRAD min A PT Short Term Goal 3 (Week 2): Pt will perform simulated car transfer with RW and mod A  Skilled Therapeutic Interventions/Progress Updates:   Received pt supine in bed staring blankly with noted R gaze preference. Pt less vocal this morning only responding to ~25% of therapist's questions. Per, RN pt had a good night last night and was more awake/alert prior to breakfast being delivered but since then has appeared quiet and depressed. Noted breakfast tray untouched and therapist suggested trying to take a few bites. Therapist fed pt total A and provided max verbal cues to close lips around utensil, chew, and swallow food. Pt moved mouth up/down and appeared to be chewing food but when therapist looked inside mouth, food remained intact. Pt unable to chew completely or swallow despite cues. Noted pocketing in R cheek > L and therapist provided total A to clear food from mouth. Therapist suggested attempting to sit up and pt stated  "I'll try anything", however pt unable to initiate movement to roll to R side and required max/total A to roll from supine<>R sidelying. Pt transferred R sidelying<>sitting EOB with max A and required max A fading to CGA to maintaining static siting balance for ~4 minutes. Pt with forward flexion and unable to sit upright or scoot hips EOB despite max cues. Pt then requested to lie down and transferred sit<>supine total A and scooted to [facility] with total A and use of Trendelenburg bed position. Concluded session with pt supine in bed, needs within reach, and bed alarm on. L UE supported on pillow for edema management and pressure relief. Discussed with NT and RN pt's currently mobility status, inability/unwillingness to eat, chew, or swallow, and potentially getting a feeding tube. 27 minutes missed of skilled physical therapy due to fatigue and unwillingness to participate.   Therapy Documentation Precautions:  Precautions Precautions: Fall Restrictions Weight Bearing Restrictions: No  Therapy/Group: Individual Therapy Martin Majestic PT, DPT   05/23/2020, 7:19 AM

## 2020-05-23 NOTE — Progress Notes (Signed)
Patient declined CPAP use for HS. States she does not wear CPAP at home. Currently on RA.

## 2020-05-23 NOTE — Progress Notes (Signed)
Occupational Therapy Session Note  Patient Details  Name: [name] MRN: [id number] Date of Birth: 08/24/1940  Today's Date: 05/23/2020 OT Individual Time: 1100-1133 OT Individual Time Calculation (min): 33 min  and Today's Date: 05/23/2020 OT Missed Time: 27 Minutes Missed Time Reason: X-Ray   Short Term Goals: Week 2:  OT Short Term Goal 1 (Week 2): Pt will complete UB bathing with Mod A OT Short Term Goal 2 (Week 2): Pt will complete 1 grooming task while sitting at the sink with mod cuing for locating items in Rt visual field OT Short Term Goal 3 (Week 2): Pt will thread 1 LE into pants with Mod A OT Short Term Goal 4 (Week 2): Pt will complete toilet transfer with mod assist  Skilled Therapeutic Interventions/Progress Updates:    Treatment session with focus on activity tolerance, bed mobility, and sitting tolerance. Pt received supine in bed agreeable to sitting to EOB.  Pt continues to be flat, with blank stare, requiring max multimodal cues and max assist for bed mobility and sidelying to sitting at EOB.  Therapist played familiar music to attempt to increase arousal and participation.  Pt smiling briefly when mentioned her dog, but unable to state any details about dog.  Pt with flexed posture in sitting but able to maintain sitting with min guard this session.  Therapist providing cues and encouragement for upright sitting posture with little improvement without manual facilitation.  Transport arrived to take pt to x-ray, therefore returned to bed total assist.  Required +2 to reposition in bed in preparation for transport.  Therapy Documentation Precautions:  Precautions Precautions: Fall Restrictions Weight Bearing Restrictions: No General: General OT Amount of Missed Time: 27 Minutes PT Missed Treatment Reason: Patient unwilling to participate;Patient fatigue Vital Signs:  Pain:     Therapy/Group: Individual Therapy  Rosalio Loud 05/23/2020, 12:21 PM

## 2020-05-23 NOTE — Progress Notes (Addendum)
Speech Language Pathology Daily Session Note  Patient Details  Name: [name] MRN: [id number] Date of Birth: 1940/08/14  Today's Date: 05/23/2020 SLP Individual Time: 1450-1535 SLP Individual Time Calculation (min): 45 min  Short Term Goals: Week 2: SLP Short Term Goal 1 (Week 2): Pt will utilize external memory aids to facilitate recall of daily information with min assist. SLP Short Term Goal 2 (Week 2): Pt will sustain attention to functional tasks with Min A cues for redirection. SLP Short Term Goal 3 (Week 2): Pt will demonstrate ability to problem solve basic functional situations with Mod A verbal/visual cues. SLP Short Term Goal 4 (Week 2): Pt will initiate in basic tasks with Mod A multimodal cues.  Skilled Therapeutic Interventions:Skilled ST services focused on cogntive skills. Pt continues to demonstrate flat affect and limited verbal responses, responded with primarily 1 word responses to 15% of questions asked. SLP provided education pertaining to limited PO intake and possible need for PEG tube placement, pt stated "mhmm." Pt was agreeable to consume "ice cream" but unable to select an option given two choices until SLP presented the ice creams. Pt required total A, hand over hand to initiate self feeding. Pt demonstrated reduced awareness of bolus and oral holding likely due to reduced focused attention. Pt also demonstrated intermittent continuous munching pattern during the treatment session. Pt was aware of continuous motion when questioned however unable to select reason why given choices nor cease motion after awareness. SLP facilitated sustained attention in action naming task when presented with picture cards, pt demonstrated sustained attention identifying actions in simple phrase for the first 45 seconds and then eventually ceased verbal response. SLP educated pt on diet downgrade to dys 1 textures, for safety due reduced mentation. Pt stated agreement. SLP downgraded  goals due to limited participation and medical changes. Pt was left in room with call bell within reach and bed alarm set. SLP recommends to continue skilled services.     Pain Pain Assessment Faces Pain Scale: No hurt  Therapy/Group: Individual Therapy  Rubel Heckard  [facility] 05/23/2020, 3:38 PM

## 2020-05-23 NOTE — Progress Notes (Signed)
Occupational Therapy Session Note  Patient Details  Name: [name] MRN: [id number] Date of Birth: 30-Aug-1940  Today's Date: 05/23/2020 OT Individual Time: 0730-0800 OT Individual Time Calculation (min): 30 min    Short Term Goals: Week 2:  OT Short Term Goal 1 (Week 2): Pt will complete UB bathing with Mod A OT Short Term Goal 2 (Week 2): Pt will complete 1 grooming task while sitting at the sink with mod cuing for locating items in Rt visual field OT Short Term Goal 3 (Week 2): Pt will thread 1 LE into pants with Mod A OT Short Term Goal 4 (Week 2): Pt will complete toilet transfer with mod assist  Skilled Therapeutic Interventions/Progress Updates:    Pt received supine, nodding and saying "mmhmm" to OT when asked questions about sleep quality, wanting to eat breakfast, etc. Attempted to have pt scoot herself up in bed with max cueing and bed in trend position but pt was non-participatory. +2 assist obtained and pt pulled up in bed to ensure proper upright posture for eating breakfast. Breakfast tray was set up for pt and cut up. Pt required max-total A to self feed 2 bites...one of which was spit out after chewing for 2+ min. Pt took several sips of OJ and declined eating or drinking further. Pt starring blankly into space, very flat affect and minimal interaction with OT. Extensive oral care completed with significant R pocketing and oral residue. Pt left supine with all needs met, bed alarm set.   Therapy Documentation Precautions:  Precautions Precautions: Fall Restrictions Weight Bearing Restrictions: No Therapy/Group: Individual Therapy  Curtis Sites 05/23/2020, 6:45 AM

## 2020-05-23 NOTE — Progress Notes (Addendum)
[place] PHYSICAL MEDICINE & REHABILITATION PROGRESS NOTE   Subjective/Complaints: Patient seen sitting up in bed this morning.  She states she slept well overnight.  She is slow to respond this morning.  Discussed with nursing, who states earlier her voice was stronger.  Discussed importance of appetite with patient.  She was seen by nephro yesterday, notes reviewed-IV fluids increased.  ROS: Denies CP, SOB, N/V/D  Objective:   US RENAL  Result Date: 05/22/2020 CLINICAL DATA:  Acute kidney injury EXAM: RENAL / URINARY TRACT ULTRASOUND COMPLETE COMPARISON:  CT abdomen pelvis 05/09/2020 FINDINGS: Right Kidney: Renal measurements: 9.4 x 4.4 x 5.1 cm = volume: 110.2 mL. Echogenicity is within normal limits. No concerning renal mass, shadowing calculus or hydronephrosis. Left Kidney: Renal measurements: 7.1 x 6.2 x 5.1 cm = volume: 167.4 mL. Echogenicity is within normal limits. No concerning renal mass, shadowing calculus or hydronephrosis. Bladder: Bladder is suboptimally distended at the time of examination limiting sonographic evaluation. No gross abnormality though overall poorly assessed. Other: Technically challenging examination due to extensive bowel gas and inability to perform breath hold or reposition. IMPRESSION: 1. Technically challenging examination due to extensive bowel gas and inability to perform breath hold or reposition. 2. Grossly unremarkable appearance of the kidneys. 3. Bladder suboptimally distended at the time of examination without gross abnormality though overall quite poorly assessed. Electronically Signed   By: Kreg Shropshire M.D.   On: 05/22/2020 23:52   Recent Labs    05/22/20 0541 05/23/20 0506  WBC 14.3* 13.3*  HGB 9.5* 9.2*  HCT 31.1* 29.7*  PLT 164 158   Recent Labs    05/22/20 0541 05/23/20 0506  NA 137 137  K 4.8 4.7  CL 103 103  CO2 22 24  GLUCOSE 138* 122*  BUN 83* 86*  CREATININE 2.69* 2.56*  CALCIUM 8.6* 8.4*    Intake/Output Summary (Last  24 hours) at 05/23/2020 1050 Last data filed at 05/22/2020 1900 Gross per 24 hour  Intake 20 ml  Output 450 ml  Net -430 ml        Physical Exam: Vital Signs Blood pressure (!) 111/58, pulse 76, temperature 98.5 F (36.9 C), resp. rate 16, height 5\' 2"  (1.575 m), weight 93 kg, SpO2 91 %.  Constitutional: No distress . Vital signs reviewed. HENT: Normocephalic.  Atraumatic. Eyes: Limited left gaze.  No discharge. Cardiovascular: No JVD.  RRR. Respiratory: Normal effort.  No stridor.  Bilateral clear to auscultation. GI: Non-distended.  BS +. Skin: Warm and dry.  Intact. Psych: Very flat. Musc: Left upper extremity with edema and tenderness Neuro: Alert Motor: Left upper extremity: Shoulder abduction 4 -/5, elbow flexion/extension 2/5, wrist extension 0/5, handgrip 2/5, some pain inhibition, unchanged Left lower extremity: 4/5 proximal to distal Right upper extremity: 4/5 proximal distal  Assessment/Plan: 1. Functional deficits secondary to Debility following submassive PE with septic emboli and right heart strain, which require 3+ hours per day of interdisciplinary therapy in a comprehensive inpatient rehab setting.  Physiatrist is providing close team supervision and 24 hour management of active medical problems listed below.  Physiatrist and rehab team continue to assess barriers to discharge/monitor patient progress toward functional and medical goals  Care Tool:  Bathing    Body parts bathed by patient: Chest, Abdomen, Front perineal area, Right upper leg, Left upper leg, Face   Body parts bathed by helper: Right arm, Left arm, Abdomen, Front perineal area, Buttocks, Right upper leg, Left upper leg, Right lower leg, Left lower leg Body  parts n/a: Right arm, Left arm, Buttocks, Right lower leg, Left lower leg   Bathing assist Assist Level: Moderate Assistance - Patient 50 - 74%     Upper Body Dressing/Undressing Upper body dressing   What is the patient wearing?:  Pull over shirt    Upper body assist Assist Level: Maximal Assistance - Patient 25 - 49%    Lower Body Dressing/Undressing Lower body dressing      What is the patient wearing?: Pants     Lower body assist Assist for lower body dressing: Total Assistance - Patient < 25%     Toileting Toileting    Toileting assist Assist for toileting: Total Assistance - Patient < 25%     Transfers Chair/bed transfer  Transfers assist     Chair/bed transfer assist level: Moderate Assistance - Patient 50 - 74%     Locomotion Ambulation   Ambulation assist      Assist level: 2 helpers Assistive device: Walker-rolling Max distance: 60ft   Walk 10 feet activity   Assist     Assist level: 2 helpers Assistive device: Walker-rolling   Walk 50 feet activity   Assist Walk 50 feet with 2 turns activity did not occur: Safety/medical concerns (fatigue, generalized weakness, decreased balance, pain)         Walk 150 feet activity   Assist Walk 150 feet activity did not occur: Safety/medical concerns (fatigue, generalized weakness, decreased balance, pain)         Walk 10 feet on uneven surface  activity   Assist Walk 10 feet on uneven surfaces activity did not occur: Safety/medical concerns (fatigue, generalized weakness, decreased balance, pain)         Wheelchair     Assist Will patient use wheelchair at discharge?: Yes Type of Wheelchair: Manual    Wheelchair assist level: Supervision/Verbal cueing Max wheelchair distance: 3ft    Wheelchair 50 feet with 2 turns activity    Assist        Assist Level: Total Assistance - Patient < 25%   Wheelchair 150 feet activity     Assist      Assist Level: Total Assistance - Patient < 25%   Blood pressure (!) 111/58, pulse 76, temperature 98.5 F (36.9 C), resp. rate 16, height 5\' 2"  (1.575 m), weight 93 kg, SpO2 91 %.  Medical Assessment/Plan: 1.Debility following submassive PE with  septic emboli and right heart strain.   Continue CIR   CT Head negative, appreciate neurology eval.  2. Antithrombotics: -DVT/anticoagulation:IVC filter  Left upper extremity Doppler showing cystic structure 5.2 cm- seroma vs hematoma per Dopplers  Will not attempt Eliquis challenge at present given persistent hematoma -antiplatelet therapy: N/A 3. Pain Management:oxycodone PRN for low back pain. Continue heating pad and lidocaine patch  Continue warm compress  ?CRPS - will consider meds if no improvement, encourage ROM  Controlled with meds on 10/12 4. Mood:Provide emotional support. Denies depression but would like to continue on Buspar.  -antipsychotic agents: N/A 5. Neuropsych: This patientisnot fully capable of making decisions on herown behalf. 6. Skin/Wound care: Continue warm compress to LUE hematoma 7. Fluids/Electrolytes/Nutrition:Routine in and outs.  Remeron started appetite on 10/8, increased on 10/12 8. Dementia: Continue Aricept and Memantine. Placed order for recreational therapy to bring in board games, which she enjoys.  9. Right hip cellulitis: continue completed course of Keflex  Vancomycin DC'd 10.  Constipation: Has BM every three days PTA. Continue Linzess  Bowel meds increased on 10/11  Reglan  5 3 times daily with meals and at bedtime started on 10/12 11.  Sleep disturbance: Continue Melatonin HS.   Avoid trazodone given #17  Ambien 2.5 nightly as needed ordered on 10/5  Improving 12. Dry eyes: Continue drops.  13.  Hypoalbuminemia  Supplement initiated on 10/4 14.  Mild transaminitis: Resolved  LFTs within normal was on 10/11 15.  Leukocytosis:   WBCs 13.3 on 10/12  Chest x-ray ordered  Afebrile  See #9  Continue to monitor 16.  Acute blood loss anemia  Hemoglobin 9.2 on 10/12  Continue to monitor 17.  Prolonged QTc  ECG reviewed from 9/29 showing severe prolongation in QT, repeat ECG reviewed, showing improvement in  QTc 18.  AKI  Creatinine 2.56 on 10/12, daily labs  Echo reviewed, EF of 60 to 65%  IVF initiated  Discussed with pharmacy, lisinopril on hold.  UA unremarkable, urine culture pending  Appreciate nephro recs  Renal ultrasound relatively unremarkable 19.  Left upper extremity edema  See #6, #3, #2  Will consider furosemide after improvement in renal function  LOS: 10 days A FACE TO FACE EVALUATION WAS PERFORMED  Kahley Leib [name] 05/23/2020, 10:50 AM

## 2020-05-23 NOTE — Progress Notes (Signed)
Admit: 05/13/2020 LOS: 10  52F AKI in setting of poor oral intake, continuation of ACE inhibitor  Subjective:   NAE overnight  SCr slightly improved  On LR 100/h  UA without hematuria proteinuria  UNa < 10  10/11 0701 - 10/12 0700 In: 140 [P.O.:140] Out: 450 [Urine:450]  Filed Weights   05/13/20 1615  Weight: 93 kg    Scheduled Meds:  (feeding supplement) PROSource Plus  30 mL Oral BID BM   amLODipine  10 mg Oral Daily   artificial tears   Left Eye QHS   aspirin EC  81 mg Oral Daily   atorvastatin  20 mg Oral QHS   busPIRone  15 mg Oral BID   cholecalciferol  1,000 Units Oral Daily   donepezil  10 mg Oral QHS   lidocaine  1 patch Transdermal Q24H   linaclotide  145 mcg Oral QAC breakfast   melatonin  3 mg Oral QHS   memantine  28 mg Oral Daily   metoCLOPramide  5 mg Oral TID AC & HS   mirtazapine  30 mg Oral QHS   multivitamins with iron  1 tablet Oral Daily   polyethylene glycol  17 g Oral BID   polyvinyl alcohol  4 drop Left Eye Daily   senna-docusate  2 tablet Oral BID   Continuous Infusions:  lactated ringers 100 mL/hr at 05/23/20 0957   PRN Meds:.acetaminophen, alum & mag hydroxide-simeth, cycloSPORINE, nitroGLYCERIN, ondansetron **OR** ondansetron, oxyCODONE, zolpidem  Current Labs: reviewed    Physical Exam:  Blood pressure (!) 111/58, pulse 76, temperature 98.5 F (36.9 C), resp. rate 16, height 5\' 2"  (1.575 m), weight 93 kg, SpO2 91 %. GEN: Elderly female, chronically ill-appearing, NAD, lethargic and somnolent, answers to voice ENT: NCAT, mild temporal wasting EYES: Sunken eyes, EOMI CV: Regular, normal S1-S2 PULM: Coarse breath sounds bilaterally ABD: Soft, nontender SKIN: No rashes, lesions, petechia, purpura EXT: trace edema  A 1. AKI, suspect is hypovolemia+ACEi.  Lisinopril has been held.  UA w/o proteinuria or hematuria. Initial UNa < 10.  Imaging w/o obstruction.  Cont LR @ 100/h for another 24h. Las LVEF 65%.   2. Debility following submassive PE complicated by acute blood loss anemia and forearm hematoma, in CIR 3. Confusion/encephalopathy; seems better today 4. Hypertension, was on ACE inhibitor, held; BPs ok   P  As above  Daily weights, Daily Renal Panel, Strict I/Os, Avoid nephrotoxins (NSAIDs, judicious IV Contrast)    MD 05/23/2020, 11:37 AM  Recent Labs  Lab 05/22/20 0541 05/23/20 0506  NA 137 137  K 4.8 4.7  CL 103 103  CO2 22 24  GLUCOSE 138* 122*  BUN 83* 86*  CREATININE 2.69* 2.56*  CALCIUM 8.6* 8.4*   Recent Labs  Lab 05/19/20 1147 05/22/20 0541 05/23/20 0506  WBC 9.4 14.3* 13.3*  NEUTROABS  --   --  10.7*  HGB 9.7* 9.5* 9.2*  HCT 31.6* 31.1* 29.7*  MCV 99.7 98.1 98.7  PLT 192 164 158

## 2020-05-23 NOTE — Consult Note (Signed)
Neuropsychological Consultation   Patient:   [name]   DOB:   February 18, 1941  MR Number:  [id number]  Location:  [facility] [address] CENTER A [id number] [place] [address] Dept: [phone] Loc: [phone]           Date of Service:   05/23/2020  Start Time:   1 PM End Time:   2 PM  Provider/Observer:  Arley Phenix, Psy.D.       Clinical Neuropsychologist       Billing Code/Service: 94854  Chief Complaint:    [name] is a 79 year old female with PMH of DVT/PE, cardiomyopathy, HTN, OSA.  Patient had been on unit in July 202 due to pontine and cerebellar embolic infarcts.  Patient stable and oriented at discharge.  Patient has been followed by behavioral health in past 1-2 years due to depression and anxiety diagnosis.  Patient was admitted this time due to syncopal episode and was found to have a submassive PE with saddle emboli and right heart strain.  Patient received tPA and developed left arm hematoma at the infusion site.  Anticoagulation was discontinued due to drop in hemoglobin and IVC filter was placed 05/10/2020.  Patient has had times on unit with significant variability in mental status with times of confusion and others with more verbal interaction and awareness.  Possible TIA events recently.     Reason for Service:  Patient was referred for neuropsychological consultation due to debility, changing cognition and history of depression/anxiety.  Below is the HPI for the current admission.  HPI: [name] is a 79 y.o. female with a PMH of DVT/PE, Takatsubo cardiomyopathy, HTN, OSA, who was admitted syncopal episode and was found to have a submassive PE with saddle emboli and right heart strain. She received tPA and developed left arm hematoma at the infusion site. She required transfusion for acute blood loss anemia. Anticoagulation was discontinued due to drop in hemoglobin.  IVC filter was placed on 05/10/20.   Current Status:  Upon entering room NT was in the room attempting to help patient finish lunch.  She had eaten very little.  She was awake and oriented to person, place and partially time (October 2021 but stated that it was 29th of October).  Stated president of [place] was Vernard Gambles.  Could not describe reason for current hospitalization or recent medical issues.  Patient never made eye contact and displayed no spontaneous speech.  Tremor/repetitive jaw movement.  Denied depress but not able to give any details.  She is described to staff members as having been doing much better verbally at start of admission and deteriorated over weekend and waxing and waneing of mental status. CT 10/3 indicated no evidence of acute intracranial abnormality with remote pontine and left cerebellar infarcts as well as chronic microvascular ischemic disease.     Behavioral Observation: [name]  presents as a 79 y.o.-year-old Right African American Female who appeared her stated age. her dress was Appropriate and she was Well Groomed and her manners were Appropriate, inappropriate to the situation.  her participation was indicative of Inattentive behaviors.  There were physical disabilities noted.  she displayed an inappropriate level of cooperation and motivation.     Interactions:    Minimal Drowsy  Attention:   abnormal and attention span appeared shorter than expected for age  Memory:   abnormal; global memory impairment noted  Visuo-spatial:  not examined  Speech (Volume):  low  Speech:   slurred; No spontaneous speech  Thought Process:  Circumstantial  Though Content:  WNL; not suicidal and not homicidal  Orientation:   person, place and time/date  Judgment:   Poor  Planning:   Poor  Affect:    Blunted, Flat and Lethargic  Mood:    Dysphoric  Insight:   Lacking  Intelligence:   normal  Medical History:   Past Medical History:  Diagnosis Date  .  Arthritis   . Asthma   . Chronic back pain   . Depression   . History of pulmonary embolus (PE)   . Hx-TIA (transient ischemic attack)   . Hyperlipemia   . Hypertension   . Personal history of DVT (deep vein thrombosis)   . Scoliosis   . Sleep apnea    cpap - settingsi at 2   . 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    Family Med/Psych History:  Family History  Problem Relation Age of Onset  . Other Father        kidney problems    Risk of Suicide/Violence: low Patient denies SI or HI.    Impression/DX:  [name] is a 79 year old female with PMH of DVT/PE, cardiomyopathy, HTN, OSA.  Patient had been on unit in July 202 due to pontine and cerebellar embolic infarcts.  Patient stable and oriented at discharge.  Patient has been followed by behavioral health in past 1-2 years due to depression and anxiety diagnosis.  Patient was admitted this time due to syncopal episode and was found to have a submassive PE with saddle emboli and right heart strain.  Patient received tPA and developed left arm hematoma at the infusion site.  Anticoagulation was discontinued due to drop in hemoglobin and IVC filter was placed 05/10/2020.  Patient has had times on unit with significant variability in mental status with times of confusion and others with more verbal interaction and awareness.  Possible TIA events recently.   Upon entering room NT was in the room attempting to help patient finish lunch.  She had eaten very little.  She was awake and oriented to person, place and partially time (October 2021 but stated that it was 29th of October).  Stated president of [place] was Vernard Gambles.  Could not describe reason for current hospitalization or recent medical issues.  Patient never made eye contact and displayed no spontaneous speech.  Tremor/repetitive jaw movement.  Denied depress but not able to give any  details.  She is described to staff members as having been doing much better verbally at start of admission and deteriorated over weekend and waxing and waneing of mental status. CT 10/3 indicated no evidence of acute intracranial abnormality with remote pontine and left cerebellar infarcts as well as chronic microvascular ischemic disease.  Disposition/Plan:  Patient will continue to be monitored for TIAs.  Since anticoagulation medications of had to been stopped she is only having a IVC filter and significant history of cardiovascular issues and prior strokes continue.  Patient is in a debility state and appears to be deteriorating likely due to cervical vascular changes.  Diagnosis:    Chronic bilateral low back pain without sciatica - Plan: Ambulatory referral to Physical Medicine Rehab  Debility - Plan: Ambulatory referral to Physical Medicine Rehab  AKI (acute kidney injury) (HCC) - Plan: US RENAL, US RENAL  Leukocytosis - Plan:  DG Chest 2 View, DG Chest 2 View         Electronically Signed   _______________________ Arley Phenix, Psy.D.

## 2020-05-23 NOTE — Plan of Care (Signed)
  Problem: Consults Goal: RH GENERAL PATIENT EDUCATION Description: See Patient Education module for education specifics. Outcome: Progressing Goal: Skin Care Protocol Initiated - if Braden Score 18 or less Description: Pt will be knowledgeable regarding skin care protocol with verbal cueing Outcome: Progressing Goal: Nutrition Consult-if indicated Outcome: Progressing Goal: Diabetes Guidelines if Diabetic/Glucose > 140 Description: If diabetic or lab glucose is > 140 mg/dl - Initiate Diabetes/Hyperglycemia Guidelines & Document Interventions  Outcome: Progressing   Problem: RH BOWEL ELIMINATION Goal: RH STG MANAGE BOWEL WITH ASSISTANCE Description: STG Manage Bowel with  mod Assistance. Outcome: Progressing Goal: RH STG MANAGE BOWEL W/MEDICATION W/ASSISTANCE Description: STG Manage Bowel with Medication with  with mod Assistance. Outcome: Progressing   Problem: RH BLADDER ELIMINATION Goal: RH STG MANAGE BLADDER WITH ASSISTANCE Description: STG Manage Bladder With mod Assistance Outcome: Progressing Goal: RH STG MANAGE BLADDER WITH EQUIPMENT WITH ASSISTANCE Description: STG Manage Bladder With Equipment With Assistance Outcome: Progressing   Problem: RH SKIN INTEGRITY Goal: RH STG SKIN FREE OF INFECTION/BREAKDOWN Description: Pt will be knowledgeable voice understanding of protocol to prevent further breakdown and improve current condition with verbal cueing Outcome: Progressing Goal: RH STG MAINTAIN SKIN INTEGRITY WITH ASSISTANCE Description: STG Maintain Skin Integrity With mod Assistance. Outcome: Progressing Goal: RH STG ABLE TO PERFORM INCISION/WOUND CARE W/ASSISTANCE Description: STG Able To Perform Incision/Wound Care With  mod Assistance. Outcome: Progressing   Problem: RH SAFETY Goal: RH STG ADHERE TO SAFETY PRECAUTIONS W/ASSISTANCE/DEVICE Description: STG Adhere to Safety Precautions With  min Assistance/Devices per therapy and protocol Outcome: Progressing Goal:  RH STG DECREASED RISK OF FALL WITH ASSISTANCE Description: STG Decreased Risk of Fall With mod  Assistance. Outcome: Progressing   Problem: RH PAIN MANAGEMENT Goal: RH STG PAIN MANAGED AT OR BELOW PT'S PAIN GOAL Description: Pain +< 4/10  Outcome: Progressing   Problem: RH KNOWLEDGE DEFICIT GENERAL Goal: RH STG INCREASE KNOWLEDGE OF SELF CARE AFTER HOSPITALIZATION Description: Will voice understanding of post care will demonstrate proper use of equipment with verbal cueing Outcome: Progressing

## 2020-05-24 ENCOUNTER — Encounter (HOSPITAL_COMMUNITY): Payer: Self-pay | Admitting: Internal Medicine

## 2020-05-24 ENCOUNTER — Inpatient Hospital Stay (HOSPITAL_COMMUNITY): Payer: Medicare Other | Admitting: Speech Pathology

## 2020-05-24 ENCOUNTER — Other Ambulatory Visit: Payer: Self-pay

## 2020-05-24 ENCOUNTER — Inpatient Hospital Stay (HOSPITAL_COMMUNITY): Payer: Medicare Other

## 2020-05-24 ENCOUNTER — Inpatient Hospital Stay (HOSPITAL_COMMUNITY)
Admission: AD | Admit: 2020-05-24 | Discharge: 2020-05-30 | DRG: 682 | Disposition: A | Discharge status: Hospice | Payer: Medicare Other | Source: Ambulatory Visit | Attending: Internal Medicine | Admitting: Internal Medicine

## 2020-05-24 ENCOUNTER — Inpatient Hospital Stay (HOSPITAL_COMMUNITY): Payer: Medicare Other | Admitting: Occupational Therapy

## 2020-05-24 DIAGNOSIS — Z9989 Dependence on other enabling machines and devices: Secondary | ICD-10-CM | POA: Diagnosis not present

## 2020-05-24 DIAGNOSIS — E43 Unspecified severe protein-calorie malnutrition: Secondary | ICD-10-CM | POA: Diagnosis present

## 2020-05-24 DIAGNOSIS — Z86711 Personal history of pulmonary embolism: Secondary | ICD-10-CM

## 2020-05-24 DIAGNOSIS — Z20822 Contact with and (suspected) exposure to covid-19: Secondary | ICD-10-CM | POA: Diagnosis present

## 2020-05-24 DIAGNOSIS — Z90722 Acquired absence of ovaries, bilateral: Secondary | ICD-10-CM

## 2020-05-24 DIAGNOSIS — F0390 Unspecified dementia without behavioral disturbance: Secondary | ICD-10-CM | POA: Diagnosis present

## 2020-05-24 DIAGNOSIS — E782 Mixed hyperlipidemia: Secondary | ICD-10-CM | POA: Diagnosis not present

## 2020-05-24 DIAGNOSIS — R29810 Facial weakness: Secondary | ICD-10-CM | POA: Diagnosis present

## 2020-05-24 DIAGNOSIS — N178 Other acute kidney failure: Secondary | ICD-10-CM

## 2020-05-24 DIAGNOSIS — T368X5A Adverse effect of other systemic antibiotics, initial encounter: Secondary | ICD-10-CM | POA: Diagnosis present

## 2020-05-24 DIAGNOSIS — R627 Adult failure to thrive: Secondary | ICD-10-CM | POA: Diagnosis present

## 2020-05-24 DIAGNOSIS — G9341 Metabolic encephalopathy: Secondary | ICD-10-CM | POA: Diagnosis present

## 2020-05-24 DIAGNOSIS — F0150 Vascular dementia without behavioral disturbance: Secondary | ICD-10-CM

## 2020-05-24 DIAGNOSIS — G8929 Other chronic pain: Secondary | ICD-10-CM | POA: Diagnosis present

## 2020-05-24 DIAGNOSIS — F32A Depression, unspecified: Secondary | ICD-10-CM | POA: Diagnosis present

## 2020-05-24 DIAGNOSIS — Z96652 Presence of left artificial knee joint: Secondary | ICD-10-CM | POA: Diagnosis present

## 2020-05-24 DIAGNOSIS — N179 Acute kidney failure, unspecified: Secondary | ICD-10-CM | POA: Diagnosis present

## 2020-05-24 DIAGNOSIS — N170 Acute kidney failure with tubular necrosis: Principal | ICD-10-CM | POA: Diagnosis present

## 2020-05-24 DIAGNOSIS — F419 Anxiety disorder, unspecified: Secondary | ICD-10-CM | POA: Diagnosis present

## 2020-05-24 DIAGNOSIS — I10 Essential (primary) hypertension: Secondary | ICD-10-CM | POA: Diagnosis present

## 2020-05-24 DIAGNOSIS — R630 Anorexia: Secondary | ICD-10-CM | POA: Diagnosis not present

## 2020-05-24 DIAGNOSIS — E875 Hyperkalemia: Secondary | ICD-10-CM

## 2020-05-24 DIAGNOSIS — Z7189 Other specified counseling: Secondary | ICD-10-CM | POA: Diagnosis not present

## 2020-05-24 DIAGNOSIS — Z79899 Other long term (current) drug therapy: Secondary | ICD-10-CM

## 2020-05-24 DIAGNOSIS — I2699 Other pulmonary embolism without acute cor pulmonale: Secondary | ICD-10-CM

## 2020-05-24 DIAGNOSIS — G4733 Obstructive sleep apnea (adult) (pediatric): Secondary | ICD-10-CM | POA: Diagnosis present

## 2020-05-24 DIAGNOSIS — I252 Old myocardial infarction: Secondary | ICD-10-CM | POA: Diagnosis not present

## 2020-05-24 DIAGNOSIS — R5381 Other malaise: Secondary | ICD-10-CM | POA: Diagnosis present

## 2020-05-24 DIAGNOSIS — Y848 Other medical procedures as the cause of abnormal reaction of the patient, or of later complication, without mention of misadventure at the time of the procedure: Secondary | ICD-10-CM | POA: Diagnosis present

## 2020-05-24 DIAGNOSIS — I720 Aneurysm of carotid artery: Secondary | ICD-10-CM | POA: Diagnosis present

## 2020-05-24 DIAGNOSIS — E785 Hyperlipidemia, unspecified: Secondary | ICD-10-CM | POA: Diagnosis present

## 2020-05-24 DIAGNOSIS — M545 Low back pain, unspecified: Secondary | ICD-10-CM | POA: Diagnosis not present

## 2020-05-24 DIAGNOSIS — Z885 Allergy status to narcotic agent status: Secondary | ICD-10-CM

## 2020-05-24 DIAGNOSIS — Z87891 Personal history of nicotine dependence: Secondary | ICD-10-CM

## 2020-05-24 DIAGNOSIS — L7632 Postprocedural hematoma of skin and subcutaneous tissue following other procedure: Secondary | ICD-10-CM | POA: Diagnosis present

## 2020-05-24 DIAGNOSIS — Z95828 Presence of other vascular implants and grafts: Secondary | ICD-10-CM

## 2020-05-24 DIAGNOSIS — R29818 Other symptoms and signs involving the nervous system: Secondary | ICD-10-CM

## 2020-05-24 DIAGNOSIS — D62 Acute posthemorrhagic anemia: Secondary | ICD-10-CM | POA: Diagnosis present

## 2020-05-24 DIAGNOSIS — Z7982 Long term (current) use of aspirin: Secondary | ICD-10-CM

## 2020-05-24 DIAGNOSIS — Z888 Allergy status to other drugs, medicaments and biological substances status: Secondary | ICD-10-CM

## 2020-05-24 DIAGNOSIS — I5181 Takotsubo syndrome: Secondary | ICD-10-CM | POA: Diagnosis present

## 2020-05-24 DIAGNOSIS — Z515 Encounter for palliative care: Secondary | ICD-10-CM

## 2020-05-24 DIAGNOSIS — Z6837 Body mass index (BMI) 37.0-37.9, adult: Secondary | ICD-10-CM

## 2020-05-24 DIAGNOSIS — Z86718 Personal history of other venous thrombosis and embolism: Secondary | ICD-10-CM | POA: Diagnosis not present

## 2020-05-24 DIAGNOSIS — I69398 Other sequelae of cerebral infarction: Secondary | ICD-10-CM | POA: Diagnosis not present

## 2020-05-24 HISTORY — DX: Cerebral infarction, unspecified: I63.9

## 2020-05-24 LAB — BASIC METABOLIC PANEL
Anion gap: 13 (ref 5–15)
BUN: 103 mg/dL — ABNORMAL HIGH (ref 8–23)
CO2: 21 mmol/L — ABNORMAL LOW (ref 22–32)
Calcium: 8.4 mg/dL — ABNORMAL LOW (ref 8.9–10.3)
Chloride: 105 mmol/L (ref 98–111)
Creatinine, Ser: 3.3 mg/dL — ABNORMAL HIGH (ref 0.44–1.00)
GFR, Estimated: 13 mL/min — ABNORMAL LOW (ref >60)
Glucose, Bld: 78 mg/dL (ref 70–99)
Potassium: 5.6 mmol/L — ABNORMAL HIGH (ref 3.5–5.1)
Sodium: 139 mmol/L (ref 135–145)

## 2020-05-24 MED ORDER — NEPRO/CARBSTEADY PO LIQD: 237.0000 mL | Freq: Three times a day (TID) | ORAL | Status: DC | PRN

## 2020-05-24 MED ORDER — ACETAMINOPHEN 325 MG PO TABS
650.0000 mg | ORAL_TABLET | Freq: Four times a day (QID) | ORAL | Status: DC | PRN
  Administered 2020-05-28 – 2020-05-30 (×2): 650 mg via ORAL
  Filled 2020-05-24 (×3): qty 2

## 2020-05-24 MED ORDER — MELATONIN 3 MG PO TABS
3.0000 mg | ORAL_TABLET | Freq: Every day | ORAL | Status: DC
  Administered 2020-05-24 – 2020-05-29 (×6): 3 mg via ORAL
  Filled 2020-05-24 (×6): qty 1

## 2020-05-24 MED ORDER — HYDROXYZINE HCL 25 MG PO TABS: 25.0000 mg | ORAL_TABLET | Freq: Three times a day (TID) | ORAL | Status: DC | PRN

## 2020-05-24 MED ORDER — ATORVASTATIN CALCIUM 10 MG PO TABS
20.0000 mg | ORAL_TABLET | Freq: Every day | ORAL | Status: DC
  Administered 2020-05-24 – 2020-05-29 (×6): 20 mg via ORAL
  Filled 2020-05-24 (×6): qty 2

## 2020-05-24 MED ORDER — OXYCODONE HCL 5 MG PO TABS
15.0000 mg | ORAL_TABLET | Freq: Four times a day (QID) | ORAL | Status: DC | PRN
  Administered 2020-05-30: 15 mg via ORAL
  Filled 2020-05-24: qty 3

## 2020-05-24 MED ORDER — LACTATED RINGERS IV SOLN: INTRAVENOUS | Status: AC

## 2020-05-24 MED ORDER — ZOLPIDEM TARTRATE 5 MG PO TABS: 5.0000 mg | ORAL_TABLET | Freq: Every evening | ORAL | Status: DC | PRN

## 2020-05-24 MED ORDER — DONEPEZIL HCL 10 MG PO TABS
10.0000 mg | ORAL_TABLET | Freq: Every day | ORAL | Status: DC
  Administered 2020-05-24 – 2020-05-29 (×6): 10 mg via ORAL
  Filled 2020-05-24 (×6): qty 1

## 2020-05-24 MED ORDER — ONDANSETRON HCL 4 MG/2ML IJ SOLN: 4.0000 mg | Freq: Four times a day (QID) | INTRAMUSCULAR | Status: DC | PRN

## 2020-05-24 MED ORDER — ASPIRIN EC 81 MG PO TBEC
81.0000 mg | DELAYED_RELEASE_TABLET | Freq: Every day | ORAL | Status: DC
  Administered 2020-05-25 – 2020-05-30 (×6): 81 mg via ORAL
  Filled 2020-05-24 (×6): qty 1

## 2020-05-24 MED ORDER — CAMPHOR-MENTHOL 0.5-0.5 % EX LOTN
1.0000 "application " | TOPICAL_LOTION | Freq: Three times a day (TID) | CUTANEOUS | Status: DC | PRN
  Filled 2020-05-24: qty 222

## 2020-05-24 MED ORDER — DOCUSATE SODIUM 283 MG RE ENEM
1.0000 | ENEMA | RECTAL | Status: DC | PRN
  Filled 2020-05-24: qty 1

## 2020-05-24 MED ORDER — POLYVINYL ALCOHOL 1.4 % OP SOLN
2.0000 [drp] | Freq: Four times a day (QID) | OPHTHALMIC | Status: DC
  Administered 2020-05-24 – 2020-05-29 (×21): 2 [drp] via OPHTHALMIC
  Filled 2020-05-24: qty 15

## 2020-05-24 MED ORDER — POLYETHYLENE GLYCOL 3350 17 G PO PACK: 17.0000 g | PACK | Freq: Every day | ORAL | Status: DC | PRN

## 2020-05-24 MED ORDER — SODIUM CHLORIDE 0.9% FLUSH
3.0000 mL | Freq: Two times a day (BID) | INTRAVENOUS | Status: DC
  Administered 2020-05-25 – 2020-05-30 (×8): 3 mL via INTRAVENOUS

## 2020-05-24 MED ORDER — CALCIUM CARBONATE ANTACID 1250 MG/5ML PO SUSP
500.0000 mg | Freq: Four times a day (QID) | ORAL | Status: DC | PRN
  Filled 2020-05-24: qty 5

## 2020-05-24 MED ORDER — ONDANSETRON HCL 4 MG PO TABS: 4.0000 mg | ORAL_TABLET | Freq: Four times a day (QID) | ORAL | Status: DC | PRN

## 2020-05-24 MED ORDER — SODIUM ZIRCONIUM CYCLOSILICATE 10 G PO PACK: 10.0000 g | PACK | Freq: Two times a day (BID) | ORAL | Status: DC

## 2020-05-24 MED ORDER — POLYETHYLENE GLYCOL 3350 17 G PO PACK
17.0000 g | PACK | Freq: Two times a day (BID) | ORAL | Status: DC
  Administered 2020-05-24 – 2020-05-27 (×5): 17 g via ORAL
  Filled 2020-05-24 (×10): qty 1

## 2020-05-24 MED ORDER — ARTIFICIAL TEARS OPHTHALMIC OINT
TOPICAL_OINTMENT | Freq: Every day | OPHTHALMIC | Status: DC
  Administered 2020-05-25: 1 via OPHTHALMIC
  Filled 2020-05-24: qty 3.5

## 2020-05-24 MED ORDER — SENNOSIDES-DOCUSATE SODIUM 8.6-50 MG PO TABS
1.0000 | ORAL_TABLET | Freq: Every day | ORAL | Status: DC
  Administered 2020-05-24 – 2020-05-29 (×5): 1 via ORAL
  Filled 2020-05-24 (×5): qty 1

## 2020-05-24 MED ORDER — SORBITOL 70 % SOLN: 30.0000 mL | Status: DC | PRN

## 2020-05-24 MED ORDER — DOCUSATE SODIUM 100 MG PO CAPS
100.0000 mg | ORAL_CAPSULE | Freq: Two times a day (BID) | ORAL | Status: DC
  Administered 2020-05-24 – 2020-05-30 (×11): 100 mg via ORAL
  Filled 2020-05-24 (×12): qty 1

## 2020-05-24 MED ORDER — ACETAMINOPHEN 650 MG RE SUPP: 650.0000 mg | Freq: Four times a day (QID) | RECTAL | Status: DC | PRN

## 2020-05-24 MED ORDER — LINACLOTIDE 145 MCG PO CAPS
145.0000 ug | ORAL_CAPSULE | Freq: Every day | ORAL | Status: DC
  Administered 2020-05-25 – 2020-05-29 (×5): 145 ug via ORAL
  Filled 2020-05-24 (×6): qty 1

## 2020-05-24 MED ORDER — MORPHINE SULFATE (PF) 2 MG/ML IV SOLN
2.0000 mg | INTRAVENOUS | Status: DC | PRN
  Administered 2020-05-24 – 2020-05-30 (×4): 2 mg via INTRAVENOUS
  Filled 2020-05-24 (×4): qty 1

## 2020-05-24 MED ORDER — HEPARIN SODIUM (PORCINE) 5000 UNIT/ML IJ SOLN
5000.0000 [IU] | Freq: Three times a day (TID) | INTRAMUSCULAR | Status: DC
  Administered 2020-05-24: 5000 [IU] via SUBCUTANEOUS
  Filled 2020-05-24: qty 1

## 2020-05-24 MED ORDER — HYDRALAZINE HCL 20 MG/ML IJ SOLN: 5.0000 mg | INTRAMUSCULAR | Status: DC | PRN

## 2020-05-24 MED ORDER — BUSPIRONE HCL 5 MG PO TABS
30.0000 mg | ORAL_TABLET | Freq: Two times a day (BID) | ORAL | Status: DC
  Administered 2020-05-24 – 2020-05-30 (×12): 30 mg via ORAL
  Filled 2020-05-24 (×12): qty 6

## 2020-05-24 MED ORDER — BISACODYL 5 MG PO TBEC: 5.0000 mg | DELAYED_RELEASE_TABLET | Freq: Every day | ORAL | Status: DC | PRN

## 2020-05-24 MED ORDER — MEMANTINE HCL ER 28 MG PO CP24
28.0000 mg | ORAL_CAPSULE | Freq: Every day | ORAL | Status: DC
  Administered 2020-05-25 – 2020-05-29 (×5): 28 mg via ORAL
  Filled 2020-05-24 (×6): qty 1

## 2020-05-24 MED ORDER — CYCLOSPORINE 0.05 % OP EMUL
1.0000 [drp] | Freq: Every day | OPHTHALMIC | Status: DC | PRN
  Filled 2020-05-24: qty 1

## 2020-05-24 NOTE — Progress Notes (Signed)
Physical Therapy Session Note  Patient Details  Name: [name] MRN: [id number] Date of Birth: 1941/01/20  Today's Date: 05/24/2020 PT Individual Time: 0800-0911 PT Individual Time Calculation (min): 71 min   Short Term Goals: Week 1:  PT Short Term Goal 1 (Week 1): Pt will perform bed mobility with mod A consistantly PT Short Term Goal 1 - Progress (Week 1): Progressing toward goal PT Short Term Goal 2 (Week 1): Pt will transfer sit<>stand with LRAD min A PT Short Term Goal 2 - Progress (Week 1): Progressing toward goal PT Short Term Goal 3 (Week 1): Pt will perform simulated car transfer with RW and mod A PT Short Term Goal 3 - Progress (Week 1): Progressing toward goal Week 2:  PT Short Term Goal 1 (Week 2): Pt will perform bed mobility with mod A consistantly PT Short Term Goal 2 (Week 2): Pt will transfer sit<>stand with LRAD min A PT Short Term Goal 3 (Week 2): Pt will perform simulated car transfer with RW and mod A  Skilled Therapeutic Interventions/Progress Updates:   Received pt supine in bed awake, pt agreeable to therapy, and reported soreness in L UE later stating "I hurt all over" but unable to specify where. Pt appeared more awake/alert this morning and more engaged in conversation more than yesterday but continues to show minimal initiation of movement. Therapist wrote down pt's schedule for the day as one not provided to pt. Pt agreeable to try to get up; initially declined getting dressed but agreeable to put pants on with convincing. Pt reported brief was clean but when therapist inspected noted pt with loose BM in brief. Doffed soiled brief and pt rolled to R with max A and required total A for peri-care. Noted dressing soiled with stool and RN notified and present to replace dressing and administer medications. MD present for morning rounds to discuss IV fluids and encouraged pt to eat. Pt rolled L and R with max A and use of bedrails and required total A to don  clean brief and to don pants. Pt required increased time with dressing due to continued bowel incontinence and lack of initiation. Pt transferred supine<>sitting EOB with max A and required total A to scoot hips to EOB. Pt worked on static sitting balance sitting EOB ~8 minutes with max A fading to CGA and max cues for trunk extension, upright gaze, and anterior weight shifting as pt with R lateral and posterior lean. Pt with improvements in balance with visual cues to look  out the window at the tops of the trees. Pt attempted sit<>stand with RW from elevated bed with max A, however pt unable to stand upright and with bilateral knee flexion, despite cues for upright posture and hip/knee extension. Pt then reported "I just pooped" and transferred sit<>supine max/total A and required total A and use of Trendelenburg bed position to scoot to [facility]. Doffed pants with total A and noted pt with soiled brief. Handoff to NT due to time limitations. Pt supine in bed, needs within reach, and bed alarm on. Memory book updated.   Therapy Documentation Precautions:  Precautions Precautions: Fall Restrictions Weight Bearing Restrictions: No   Therapy/Group: Individual Therapy Martin Majestic PT, DPT   05/24/2020, 7:29 AM

## 2020-05-24 NOTE — Progress Notes (Signed)
Patient discharged to renal unit and left floor at about noon. Patient alert and oriented per usual.IV infusing.

## 2020-05-24 NOTE — Progress Notes (Signed)
Admit: 05/13/2020 LOS: 11  18F AKI in setting of poor oral intake, continuation of ACE inhibitor  Subjective:  . NAE overnight . SCr worsened to 3.3, BUN 103, K 5.6 . Remains on LR 100/h . Unmeasured UOP . Pt lethargic, still poor PO  . UA without hematuria proteinuria . UNa < 10  10/12 0701 - 10/13 0700 In: 3111.4 [P.O.:150; I.V.:2961.4] Out: -   Filed Weights   05/13/20 1615  Weight: 93 kg    Scheduled Meds: . (feeding supplement) PROSource Plus  30 mL Oral BID BM  . amLODipine  10 mg Oral Daily  . artificial tears   Left Eye QHS  . aspirin EC  81 mg Oral Daily  . atorvastatin  20 mg Oral QHS  . busPIRone  15 mg Oral BID  . cholecalciferol  1,000 Units Oral Daily  . donepezil  10 mg Oral QHS  . lidocaine  1 patch Transdermal Q24H  . linaclotide  145 mcg Oral QAC breakfast  . melatonin  3 mg Oral QHS  . memantine  28 mg Oral Daily  . metoCLOPramide  5 mg Oral TID AC & HS  . mirtazapine  30 mg Oral QHS  . multivitamins with iron  1 tablet Oral Daily  . polyethylene glycol  17 g Oral BID  . polyvinyl alcohol  4 drop Left Eye Daily  . senna-docusate  2 tablet Oral BID  . sodium zirconium cyclosilicate  10 g Oral BID   Continuous Infusions: . lactated ringers 100 mL/hr at 05/24/20 0730   PRN Meds:.acetaminophen, alum & mag hydroxide-simeth, cycloSPORINE, nitroGLYCERIN, ondansetron **OR** ondansetron, oxyCODONE, zolpidem  Current Labs: reviewed    Physical Exam:  Blood pressure (!) 110/50, pulse 64, temperature 97.7 F (36.5 C), resp. rate 16, height 5\' 2"  (1.575 m), weight 93 kg, SpO2 97 %. GEN: Elderly female, chronically ill-appearing, NAD, lethargic and somnolent, answers to voice ENT: NCAT, mild temporal wasting EYES: Sunken eyes, EOMI CV: Regular, normal S1-S2 PULM: Coarse breath sounds bilaterally ABD: Soft, nontender SKIN: No rashes, lesions, petechia, purpura EXT: trace edema  A 1. AKI, suspect is hypovolemia+ACEi.  Lisinopril has been held.  UA  w/o proteinuria or hematuria. Initial UNa < 10.  Imaging w/o obstruction.  Cont LR @ 100/h for another 24h given poor PO but probably have nearly corrected hypovolemic component. [place] LVEF 65%.  2. Debility following submassive PE complicated by acute blood loss anemia and forearm hematoma, in CIR 3. Confusion/encephalopathy; 4. Hypertension, was on ACE inhibitor, held; BPs ok   P . As above, Discussed with Dr. . Lokelma today for mild hyperK . Daily weights, Daily Renal Panel, Strict I/Os, Avoid nephrotoxins (NSAIDs, judicious IV Contrast)    Allena Katz MD 05/24/2020, 11:44 AM  Recent Labs  Lab 05/22/20 0541 05/23/20 0506 05/24/20 0639  NA 137 137 139  K 4.8 4.7 5.6*  CL 103 103 105  CO2 22 24 21*  GLUCOSE 138* 122* 78  BUN 83* 86* 103*  CREATININE 2.69* 2.56* 3.30*  CALCIUM 8.6* 8.4* 8.4*   Recent Labs  Lab 05/19/20 1147 05/22/20 0541 05/23/20 0506  WBC 9.4 14.3* 13.3*  NEUTROABS  --   --  10.7*  HGB 9.7* 9.5* 9.2*  HCT 31.6* 31.1* 29.7*  MCV 99.7 98.1 98.7  PLT 192 164 158

## 2020-05-24 NOTE — Progress Notes (Signed)
Declined CPAP use HS. Currently on RA.

## 2020-05-24 NOTE — Progress Notes (Signed)
NEW ADMISSION NOTE New Admission Note:   Arrival Method: Bed Mental Orientation: A&O X4 Telemetry: 5M06 Assessment: Completed Skin: See Flowsheets IV: WDL  Pain: Denies Safety Measures: Safety Fall Prevention Plan has been given, discussed and signed Admission: Completed [facility] Orientation: Patient has been orientated to the room, unit and staff.   Orders have been reviewed and implemented. Will continue to monitor the patient. Call light has been placed within reach and bed alarm has been activated.   Selina Cooley BSN, RN3

## 2020-05-24 NOTE — Progress Notes (Signed)
Occupational Therapy Session Note  Patient Details  Name: [name] MRN: [id number] Date of Birth: September 24, 1940  Today's Date: 05/24/2020 OT Individual Time: 1045-1130 OT Individual Time Calculation (min): 45 min    Short Term Goals: Week 2:  OT Short Term Goal 1 (Week 2): Pt will complete UB bathing with Mod A OT Short Term Goal 2 (Week 2): Pt will complete 1 grooming task while sitting at the sink with mod cuing for locating items in Rt visual field OT Short Term Goal 3 (Week 2): Pt will thread 1 LE into pants with Mod A OT Short Term Goal 4 (Week 2): Pt will complete toilet transfer with mod assist  Skilled Therapeutic Interventions/Progress Updates:    Patient in bed, alert, poor eye contact, occ one word responses.   She notes pain in her left arm with movement initially but tolerates gentle ROM shoulder below 90 and full ROM distal.  Supine to sitting edge of bed is dependent.   She does not initiate LB, trunk or UB to move toward edge of bed although she requested to sit upright.  She tolerated unsupported sitting with min/mod A (posterior lean and slumped posture)  - attempted lateral lean and trunk mobility with limited participation.   Completed oral care - dependent to set up and brush teeth at this time.  Returned to supine - dependent.  Completed UB and LB AAROM with fair participation.  HOH/dependent to wash face.  She remained in bed at close of session with HOB 35 degrees, bed alarm set and callbell in hand.    Therapy Documentation Precautions:  Precautions Precautions: Fall Restrictions Weight Bearing Restrictions: No   Therapy/Group: Individual Therapy  Barrie Lyme 05/24/2020, 7:35 AM

## 2020-05-24 NOTE — Progress Notes (Addendum)
[facility] PHYSICAL MEDICINE & REHABILITATION PROGRESS NOTE   Subjective/Complaints: Patient seen laying in bed this morning, working with therapies.  She is slightly more talkative than yesterday, but limited to short responses.  Discussed with therapies activity as well.  Discussed with nephrologist renal function and prognosis.  She was seen by nephrology as well yesterday, notes reviewed - cont IVF. Discussed nutrition with patient.   ROS: Denies CP, SOB, N/V/D  Objective:   DG Chest 2 View  Result Date: 05/23/2020 CLINICAL DATA:  Leukocytosis EXAM: CHEST - 2 VIEW COMPARISON:  May 05, 2020 FINDINGS: There is atelectatic change in the left base. The lungs elsewhere are clear. Heart is upper normal in size with pulmonary vascularity within normal limits. There is aortic atherosclerosis. Loop recorder present on the left anteriorly. There is postoperative change in the lumbar spine. Bones are osteoporotic. IMPRESSION: Left base atelectasis. Lungs otherwise clear. Heart upper normal in size. No appreciable adenopathy. Aortic Atherosclerosis (ICD10-I70.0). Electronically Signed   By: Bretta Bang III M.D.   On: 05/23/2020 13:30   US RENAL  Result Date: 05/22/2020 CLINICAL DATA:  Acute kidney injury EXAM: RENAL / URINARY TRACT ULTRASOUND COMPLETE COMPARISON:  CT abdomen pelvis 05/09/2020 FINDINGS: Right Kidney: Renal measurements: 9.4 x 4.4 x 5.1 cm = volume: 110.2 mL. Echogenicity is within normal limits. No concerning renal mass, shadowing calculus or hydronephrosis. Left Kidney: Renal measurements: 7.1 x 6.2 x 5.1 cm = volume: 167.4 mL. Echogenicity is within normal limits. No concerning renal mass, shadowing calculus or hydronephrosis. Bladder: Bladder is suboptimally distended at the time of examination limiting sonographic evaluation. No gross abnormality though overall poorly assessed. Other: Technically challenging examination due to extensive bowel gas and inability to perform  breath hold or reposition. IMPRESSION: 1. Technically challenging examination due to extensive bowel gas and inability to perform breath hold or reposition. 2. Grossly unremarkable appearance of the kidneys. 3. Bladder suboptimally distended at the time of examination without gross abnormality though overall quite poorly assessed. Electronically Signed   By: Kreg Shropshire M.D.   On: 05/22/2020 23:52   Recent Labs    05/22/20 0541 05/23/20 0506  WBC 14.3* 13.3*  HGB 9.5* 9.2*  HCT 31.1* 29.7*  PLT 164 158   Recent Labs    05/23/20 0506 05/24/20 0639  NA 137 139  K 4.7 5.6*  CL 103 105  CO2 24 21*  GLUCOSE 122* 78  BUN 86* 103*  CREATININE 2.56* 3.30*  CALCIUM 8.4* 8.4*    Intake/Output Summary (Last 24 hours) at 05/24/2020 1005 Last data filed at 05/24/2020 0750 Gross per 24 hour  Intake 3333.36 ml  Output --  Net 3333.36 ml        Physical Exam: Vital Signs Blood pressure (!) 110/50, pulse 64, temperature 97.7 F (36.5 C), resp. rate 16, height 5\' 2"  (1.575 m), weight 93 kg, SpO2 97 %.  Constitutional: No distress . Vital signs reviewed. HENT: Normocephalic.  Atraumatic. Eyes: Limited left gaze.  No discharge. Cardiovascular: No JVD.  RRR. Respiratory: Normal effort.  No stridor.  Bilateral clear to auscultation. GI: Non-distended.  BS +. Skin: Warm and dry.  Intact. Psych: Flat, short responses. Musc: Left upper extremity with edema and tenderness, tenderness improving Neuro: Alert Motor: Left upper extremity: Shoulder abduction 4 -/5, elbow flexion/extension 2/5, wrist extension 0/5, handgrip 2/5, some pain inhibition, stable Left lower extremity: 3-/5 proximal to distal (?  Effort) Right upper extremity: 4/5 proximal distal  Assessment/Plan: 1. Functional deficits secondary to  Debility following submassive PE with septic emboli and right heart strain, which require 3+ hours per day of interdisciplinary therapy in a comprehensive inpatient rehab  setting.  Physiatrist is providing close team supervision and 24 hour management of active medical problems listed below.  Physiatrist and rehab team continue to assess barriers to discharge/monitor patient progress toward functional and medical goals  Care Tool:  Bathing    Body parts bathed by patient: Chest, Abdomen, Front perineal area, Right upper leg, Left upper leg, Face   Body parts bathed by helper: Right arm, Left arm, Abdomen, Front perineal area, Buttocks, Right upper leg, Left upper leg, Right lower leg, Left lower leg Body parts n/a: Right arm, Left arm, Buttocks, Right lower leg, Left lower leg   Bathing assist Assist Level: Moderate Assistance - Patient 50 - 74%     Upper Body Dressing/Undressing Upper body dressing   What is the patient wearing?: Pull over shirt    Upper body assist Assist Level: Maximal Assistance - Patient 25 - 49%    Lower Body Dressing/Undressing Lower body dressing      What is the patient wearing?: Pants     Lower body assist Assist for lower body dressing: Total Assistance - Patient < 25%     Toileting Toileting    Toileting assist Assist for toileting: Total Assistance - Patient < 25%     Transfers Chair/bed transfer  Transfers assist     Chair/bed transfer assist level: Moderate Assistance - Patient 50 - 74%     Locomotion Ambulation   Ambulation assist      Assist level: 2 helpers Assistive device: Walker-rolling Max distance: 31ft   Walk 10 feet activity   Assist     Assist level: 2 helpers Assistive device: Walker-rolling   Walk 50 feet activity   Assist Walk 50 feet with 2 turns activity did not occur: Safety/medical concerns (fatigue, generalized weakness, decreased balance, pain)         Walk 150 feet activity   Assist Walk 150 feet activity did not occur: Safety/medical concerns (fatigue, generalized weakness, decreased balance, pain)         Walk 10 feet on uneven surface   activity   Assist Walk 10 feet on uneven surfaces activity did not occur: Safety/medical concerns (fatigue, generalized weakness, decreased balance, pain)         Wheelchair     Assist Will patient use wheelchair at discharge?: Yes Type of Wheelchair: Manual    Wheelchair assist level: Supervision/Verbal cueing Max wheelchair distance: 37ft    Wheelchair 50 feet with 2 turns activity    Assist        Assist Level: Total Assistance - Patient < 25%   Wheelchair 150 feet activity     Assist      Assist Level: Total Assistance - Patient < 25%   Blood pressure (!) 110/50, pulse 64, temperature 97.7 F (36.5 C), resp. rate 16, height 5\' 2"  (1.575 m), weight 93 kg, SpO2 97 %.  Medical Assessment/Plan: 1.Debility following submassive PE with septic emboli and right heart strain.   Continue CIR   CT Head negative, appreciate neurology eval.  2. Antithrombotics: -DVT/anticoagulation:IVC filter  Left upper extremity Doppler showing cystic structure 5.2 cm- seroma vs hematoma per Dopplers  Will not attempt Eliquis challenge at present given persistent hematoma -antiplatelet therapy: N/A 3. Pain Management:oxycodone PRN for low back pain. Continue heating pad and lidocaine patch  Continue warm compress  ?CRPS - will consider  meds if no improvement, encourage ROM  Controlled with meds on 10/13 4. Mood:Provide emotional support. Denies depression but would like to continue on Buspar.  -antipsychotic agents: N/A 5. Neuropsych: This patientisnot fully capable of making decisions on herown behalf. 6. Skin/Wound care: Continue warm compress to LUE hematoma 7. Fluids/Electrolytes/Nutrition:Routine in and outs.  Remeron started appetite on 10/8, increased on 10/12 8. Dementia: Continue Aricept and Memantine. Placed order for recreational therapy to bring in board games, which she enjoys.  9. Right hip cellulitis: continue completed  course of Keflex  Vancomycin DC'd 10.  Constipation: Has BM every three days PTA. Continue Linzess  Bowel meds increased on 10/11  Reglan 5 3 times daily with meals and at bedtime started on 10/12 11.  Sleep disturbance: Continue Melatonin HS.   Avoid trazodone given #17  Ambien 2.5 nightly as needed ordered on 10/5  Improving 12. Dry eyes: Continue drops.  13.  Hypoalbuminemia  Supplement initiated on 10/4 14.  Mild transaminitis: Resolved  LFTs within normal was on 10/11 15.  Leukocytosis:   WBCs 13.3 on 10/12  Chest x-ray personally reviewed, unremarkable for infection  Ucx no growth  Afebrile  See #9  Continue to monitor 16.  Acute blood loss anemia  Hemoglobin 9.2 on 10/12  Continue to monitor 17.  Prolonged QTc  ECG reviewed from 9/29 showing severe prolongation in QT, repeat ECG reviewed, showing improvement in QTc 18.  Severe AKI  Discussed with nephrology, may require HD  Creatinine 3.30 on 10/13  Echo reviewed, EF of 60 to 65%  IVF initiated  Discussed with pharmacy, lisinopril on hold.  UA unremarkable, urine culture no growth  Appreciate nephro recs  Renal ultrasound relatively unremarkable 19.  Left upper extremity edema  See #6, #3, #2  Will consider furosemide after improvement in renal function 20. Hyperkalemia  Lokelma ordered  > 30 minutes spent in total in discharge planning between myself and PA regarding aforementioned, as well as communication and coordination between nephrologist and hospitalist plans for transfer to acute for closer monitoring and further work-up given severe acute kidney injury, and discussion with patient, son, and later daughter-in-law  LOS: 11 days A FACE TO FACE EVALUATION WAS PERFORMED  Olevia Westervelt [name] 05/24/2020, 10:05 AM

## 2020-05-24 NOTE — Progress Notes (Signed)
Patient's son, [name], notified via phone that patient had been taken to 5M06.

## 2020-05-24 NOTE — Progress Notes (Signed)
PT Cancellation Note  Patient Details Name: [name] MRN: [id number] DOB: [birth year]   Cancelled Treatment:    Reason Eval/Treat Not Completed: Patient at procedure or test/unavailable.  Pt is being seen bedside with a staff member, will retry at another time.   Ivar Drape 05/24/2020, 5:16 PM   Samul Dada, PT MS Acute Rehab Dept. Number: [facility] [id number] and [facility] [phone]

## 2020-05-24 NOTE — Progress Notes (Signed)
Inpatient Rehabilitation Care Coordinator  Discharge Note  The overall goal for the admission was met for:   Discharge location: No-TRANSFERRING TO ACUTE DUE TO MEDICAL ISSUES  Length of Stay: No-11 DAYS  Discharge activity level: No  Home/community participation: No  Services provided included: MD, RD, PT, OT, SLP, RN, CM, Pharmacy and SW  Financial Services: Medicare and [place]: [place]  Follow-up services arranged: Other: TRANSFERRING TO ACUTE MEDICAL ISSUES  Comments (or additional information):  Patient/Family verbalized understanding of follow-up arrangements: Yes  Individual responsible for coordination of the follow-up plan: RANDY-SON  Confirmed correct DME delivered: Elease Hashimoto 05/24/2020    Elease Hashimoto

## 2020-05-24 NOTE — Discharge Summary (Addendum)
Physician Discharge Summary  Patient ID: [name] MRN: [id number] DOB/AGE: 1940-10-29 79 y.o.  Admit date: 05/13/2020 Discharge date: 05/24/2020  Discharge Diagnoses:  Principal Problem:   Debility Active Problems:   Acute blood loss anemia   Transaminitis   Chronic bilateral low back pain without sciatica   Prolonged Q-T interval on ECG   Hematoma   Edema of left upper extremity AKI  Discharged Condition: Guarded  Significant Diagnostic Studies: CT ABDOMEN PELVIS WO CONTRAST  Result Date: 05/09/2020 CLINICAL DATA:  Abdominal distension and lower back pain. EXAM: CT ABDOMEN AND PELVIS WITHOUT CONTRAST TECHNIQUE: Multidetector CT imaging of the abdomen and pelvis was performed following the standard protocol without IV contrast. COMPARISON:  None. FINDINGS: Lower chest: Very mild atelectasis is seen within the posterior aspects of the bilateral lung bases. Hepatobiliary: No focal liver abnormality is seen. The gallbladder is moderately distended. No gallstones, gallbladder wall thickening, or biliary dilatation. Pancreas: Unremarkable. No pancreatic ductal dilatation or surrounding inflammatory changes. Spleen: Normal in size without focal abnormality. Adrenals/Urinary Tract: Adrenal glands are unremarkable. Kidneys are normal, without renal calculi, focal lesion, or hydronephrosis. Bladder is unremarkable. Stomach/Bowel: Stomach is within normal limits. Appendix appears normal. No evidence of bowel wall thickening, distention, or inflammatory changes. Noninflamed diverticula are seen within the sigmoid colon. Vascular/Lymphatic: There is moderate severity calcification and tortuosity of the abdominal aorta and bilateral common iliac arteries, without evidence of aneurysmal dilatation. No enlarged abdominal or pelvic lymph nodes. Reproductive: Uterus and bilateral adnexa are unremarkable. Other: No abdominal wall hernia or abnormality. No abdominopelvic ascites. Musculoskeletal: A 5.8  cm x 6.2 cm area (incompletely imaged) of intramuscular low attenuation is seen along the medial aspect of the left upper extremity. This is adjacent to the mid and distal portions of the left humeral shaft (axial CT images 81 through 18, CT series number 3). A moderate amount of adjacent deep subcutaneous inflammatory fat stranding is seen. Moderate severity subcutaneous inflammatory fat stranding is seen along the lateral aspect of the right hip and right hemipelvis. There is marked severity levoscoliosis of the lumbar spine. Bilateral metallic density pedicle screws are seen from the levels of L2 through S1. An extensive amount of associated streak artifact is seen. Subsequently limited evaluation of the contents of the abdomen and pelvis is noted. IMPRESSION: 1. Large area of intramuscular low attenuation along the medial aspect of the left upper extremity which may represent an evolving hematoma. An infectious or neoplastic process cannot be excluded. MRI correlation is recommended. 2. Moderate severity cellulitis along the lateral aspect of the right hip and right hemipelvis. 3. Sigmoid diverticulosis without evidence of diverticulitis. 4. 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 5. Aortic atherosclerosis. Aortic Atherosclerosis (ICD10-I70.0). Electronically Signed   By: Aram Candela M.D.   On: 05/09/2020 16:16   DG Chest 2 View  Result Date: 05/23/2020 CLINICAL DATA:  Leukocytosis EXAM: CHEST - 2 VIEW COMPARISON:  May 05, 2020 FINDINGS: There is atelectatic change in the left base. The lungs elsewhere are clear. Heart is upper normal in size with pulmonary vascularity within normal limits. There is aortic atherosclerosis. Loop recorder present on the left anteriorly. There is postoperative change in the lumbar spine. Bones are osteoporotic. IMPRESSION: Left base atelectasis. Lungs otherwise clear. Heart upper normal in size. No appreciable  adenopathy. Aortic Atherosclerosis (ICD10-I70.0). Electronically Signed   By: Bretta Bang III M.D.   On: 05/23/2020 13:30   CT Head Wo Contrast  Result Date: 05/05/2020  CLINICAL DATA:  Mental status change.  Somnolent. EXAM: CT HEAD WITHOUT CONTRAST TECHNIQUE: Contiguous axial images were obtained from the base of the skull through the vertex without intravenous contrast. COMPARISON:  Head CT and brain MRI 02/17/2020 FINDINGS: Brain: No intracranial hemorrhage, mass effect, or midline shift. 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 No hydrocephalus. The basilar cisterns are patent. Small left superior cerebellar infarcts unchanged from prior. 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 The right pontine infarct on prior is not well visualized. Unchanged chronic small vessel ischemia. No evidence of territorial infarct or acute ischemia. No extra-axial or intracranial fluid collection. Vascular: No hyperdense vessel. Skull: No fracture or focal lesion.  Bifrontal hyperostosis. Sinuses/Orbits: No acute findings. Other: None. IMPRESSION: 1. No acute intracranial abnormality. 2. Stable chronic small vessel ischemia, remote left cerebellar infarct. Pontine infarcts that were acute on July exam demonstrate minimal encephalomalacia on the left, and is not seen on the right. Electronically Signed   By: Narda Rutherford M.D.   On: 05/05/2020 21:04   CT Angio Chest PE W and/or Wo Contrast  Result Date: 05/05/2020 CLINICAL DATA:  Suspected pulmonary embolus with low to intermediate probability. Positive D-dimer. Near syncope. EXAM: CT ANGIOGRAPHY CHEST WITH CONTRAST TECHNIQUE: Multidetector CT imaging of the chest was performed using the standard protocol during bolus administration of intravenous contrast. Multiplanar CT image reconstructions and MIPs were obtained to evaluate the vascular anatomy. CONTRAST:  80mL OMNIPAQUE IOHEXOL 350  MG/ML SOLN COMPARISON:  Chest CT 08/26/2018.  Chest radiograph 05/05/2020 FINDINGS: Cardiovascular: Good opacification of the central and segmental pulmonary arteries. Large filling defects are demonstrated in the central, lobar, and segmental pulmonary arteries bilaterally consistent with acute pulmonary embolus including saddle embolus and large clot burden. The RV to LV ratio is elevated at 2.8 and there is significant reflux of contrast material into the hepatic veins, suggesting right heart strain. No pericardial effusions. Calcification of the aorta. Ascending aorta measures 4.2 cm diameter. Mediastinum/Nodes: No significant lymphadenopathy. Esophagus is decompressed. Lungs/Pleura: 3 mm nodule in the right mid lung likely calcified. Mild atelectasis in the lung bases. No focal consolidation. No pleural effusions. No pneumothorax. Upper Abdomen: No acute abnormalities demonstrated in the visualized upper abdomen. Musculoskeletal: Lumbar scoliosis with postoperative change. Degenerative changes. Anterior compression of T11 and T12, chronic. No destructive bone lesions. Review of the MIP images confirms the above findings. IMPRESSION: 1. Large bilateral acute pulmonary embolus including saddle embolus and large clot burden. CT evidence of right heart strain (RV/LV Ratio = 2.8) consistent with at least submassive (intermediate risk) PE. The presence of right heart strain has been associated with an increased risk of morbidity and mortality. 2. Mild atelectasis in the lung bases. 3. Aortic atherosclerosis. Aortic Atherosclerosis (ICD10-I70.0). Critical Value/emergent results were called by telephone at the time of interpretation on 05/05/2020 at 9:05 pm to provider PA [place], who verbally acknowledged these results. Electronically Signed   By: Burman Nieves M.D.   On: 05/05/2020 21:09   IR IVC FILTER PLMT / S&I Lenise Arena GUID/MOD SED  Result Date: 05/10/2020 CLINICAL DATA:  79 year old female with history of DVT,  PE, and large left upper extremity hematoma. EXAM: 1. ULTRASOUND GUIDANCE FOR VASCULAR ACCESS OF THE RIGHT internal jugular VEIN. 2. IVC VENOGRAM. 3. PERCUTANEOUS IVC FILTER PLACEMENT. ANESTHESIA/SEDATION: 1 mg IV Versed; 75 mcg IV Fentanyl. Total Moderate Sedation Time Thirteenminutes. CONTRAST:  85mL OMNIPAQUE IOHEXOL 300 MG/ML  SOLN FLUOROSCOPY TIME:  4 minutes,  24 seconds, 134 mGy PROCEDURE: The procedure, risks, benefits, and alternatives were explained to the patient. Questions regarding the procedure were encouraged and answered. The patient understands and consents to the procedure. The patient was prepped with Betadine in a sterile fashion, and a sterile drape was applied covering the operative field. A sterile gown and sterile gloves were used for the procedure. Local anesthesia was provided with 1% Lidocaine. Under direct ultrasound guidance, a 21 gauge needle was advanced into the right internal jugular vein with ultrasound image documentation performed. After securing access with a micropuncture dilator, a guidewire was advanced into the inferior vena cava. A deployment sheath was advanced over the guidewire. This was utilized to perform IVC venography. The deployment sheath was further positioned in an appropriate location for filter deployment. A Denali IVC filter was then advanced in the sheath. This was then fully deployed in the infrarenal IVC. Final filter position was confirmed with a fluoroscopic spot image. Contrast injection was also performed through the sheath under fluoroscopy to confirm patency of the IVC at the level of the filter. The apex of the filter with was against the left lateral wall, just inferior to the left renal vein. Therefore the indwelling filter deployment sheath was exchanged for an 11 French coaxial retrieval sheath system, through which a loop snare was used to capture the apex hook and reposition the filter to a more and line position with the infrarenal segment of  the inferior vena cava. Repeat contrast injection through the indwelling sheath demonstrated better positioning of the filter. After the procedure the sheath was removed and hemostasis obtained with manual compression. COMPLICATIONS: None. FINDINGS: IVC venography demonstrates a normal caliber IVC with no evidence of thrombus. Renal veins are identified bilaterally. The IVC filter was successfully positioned below the level of the renal veins and is appropriately oriented. This IVC filter has both permanent and retrievable indications. IMPRESSION: Placement of percutaneous IVC filter in infrarenal IVC. IVC venogram shows no evidence of IVC thrombus and normal caliber of the inferior vena cava. This filter does have both permanent and retrievable indications. PLAN: This IVC filter is potentially retrievable. The patient will be assessed for filter retrieval by Interventional Radiology in approximately 8-12 weeks. Further recommendations regarding filter retrieval, continued surveillance or declaration of device permanence, will be made at that time. Marliss Coots, MD [facility] Electronically Signed   By: Marliss Coots MD   On: 05/10/2020 13:09   US RENAL  Result Date: 05/22/2020 CLINICAL DATA:  Acute kidney injury EXAM: RENAL / URINARY TRACT ULTRASOUND COMPLETE COMPARISON:  CT abdomen pelvis 05/09/2020 FINDINGS: Right Kidney: Renal measurements: 9.4 x 4.4 x 5.1 cm = volume: 110.2 mL. Echogenicity is within normal limits. No concerning renal mass, shadowing calculus or hydronephrosis. Left Kidney: Renal measurements: 7.1 x 6.2 x 5.1 cm = volume: 167.4 mL. Echogenicity is within normal limits. No concerning renal mass, shadowing calculus or hydronephrosis. Bladder: Bladder is suboptimally distended at the time of examination limiting sonographic evaluation. No gross abnormality though overall poorly assessed. Other: Technically challenging examination due  to extensive bowel gas and inability to perform breath hold or reposition. IMPRESSION: 1. Technically challenging examination due to extensive bowel gas and inability to perform breath hold or reposition. 2. Grossly unremarkable appearance of the kidneys. 3. Bladder suboptimally distended at the time of examination without gross abnormality though overall quite poorly assessed. Electronically Signed   By: Kreg Shropshire M.D.   On: 05/22/2020 23:52  DG Chest Portable 1 View  Result Date: 05/05/2020 CLINICAL DATA:  Near syncope EXAM: PORTABLE CHEST 1 VIEW COMPARISON:  November 07, 2019 FINDINGS: The heart size is enlarged. Aortic calcifications are noted. The pulmonary arteries appear to be dilated which can be seen in patients with elevated pulmonary artery pressures. There is no pneumothorax or large pleural effusion. There is a device projecting over the patient's left ventricle which may represent a loop recorder or lead less pacemaker. There is no acute displaced fracture. There are degenerative changes of both glenohumeral joints. IMPRESSION: No active disease. Electronically Signed   By: Katherine Mantle M.D.   On: 05/05/2020 16:56   ECHOCARDIOGRAM COMPLETE  Result Date: 05/06/2020    ECHOCARDIOGRAM REPORT   Patient Name:   [name] Meisinger Date of Exam: 05/06/2020 Medical Rec #:  [id number]        Height:       62.0 in Accession #:    [id number]       Weight:       183.6 lb Date of Birth:  [birth year]       BSA:          1.844 m Patient Age:    78 years         BP:           132/82 mmHg Patient Gender: F                HR:           59 bpm. 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 CAD; Risk Factors:Sleep Apnea, Hypertension and                 Dyslipidemia.  Sonographer:    Irving Burton Senior RDCS Referring Phys:  [id number] Aliene Beams  Sonographer Comments: Technically difficult due to poor echo windows. IMPRESSIONS  1. Left ventricular ejection fraction, by estimation, is 60 to 65%. The left ventricle has normal function. The left ventricle has no regional wall motion abnormalities. There is mild left ventricular hypertrophy. Left ventricular diastolic parameters are consistent with Grade I diastolic dysfunction (impaired relaxation).  2. Findings suggest mild RV dysfunction.. Right ventricular systolic function is mildly reduced. The right ventricular size is mildly enlarged. There is mildly elevated pulmonary artery systolic pressure. The estimated right ventricular systolic pressure is 40.7 mmHg.  3. Left atrial size was mildly dilated.  4. The mitral valve is abnormal. Trivial mitral valve regurgitation.  5. Tricuspid valve regurgitation is mild to moderate.  6. The aortic valve is tricuspid. Aortic valve regurgitation is mild. Mild aortic valve sclerosis is present, with no evidence of aortic valve stenosis.  7. The inferior vena cava is normal in size with greater than 50% respiratory variability, suggesting right atrial pressure of 3 mmHg. FINDINGS  Left Ventricle: Left ventricular ejection fraction, by estimation, is 60 to 65%. The left ventricle has normal function. The left ventricle has no regional wall motion abnormalities. The left ventricular internal cavity size was normal in size. There is  mild left ventricular hypertrophy. Left ventricular diastolic parameters are consistent with Grade I diastolic dysfunction (impaired relaxation). Normal left ventricular filling pressure. Right Ventricle: Findings suggest mild RV dysfunction. The right ventricular size  is mildly enlarged. No increase in right ventricular wall thickness. Right ventricular systolic function is mildly reduced. There is mildly elevated pulmonary artery systolic pressure. The tricuspid regurgitant velocity is 3.07 m/s, and with an assumed  right atrial pressure of 3 mmHg, the estimated right ventricular systolic pressure is 40.7 mmHg. Left Atrium: Left atrial size was mildly dilated. Right Atrium: Right atrial size was normal in size. Pericardium: There is no evidence of pericardial effusion. Mitral Valve: The mitral valve is abnormal. There is mild thickening of the mitral valve leaflet(s). Trivial mitral valve regurgitation. Tricuspid Valve: The tricuspid valve is grossly normal. Tricuspid valve regurgitation is mild to moderate. Aortic Valve: The aortic valve is tricuspid. Aortic valve regurgitation is mild. Aortic regurgitation PHT measures 688 msec. Mild aortic valve sclerosis is present, with no evidence of aortic valve stenosis. Pulmonic Valve: The pulmonic valve was grossly normal. Pulmonic valve regurgitation is trivial. Aorta: The aortic root and ascending aorta are structurally normal, with no evidence of dilitation. Venous: The inferior vena cava is normal in size with greater than 50% respiratory variability, suggesting right atrial pressure of 3 mmHg. IAS/Shunts: No atrial level shunt detected by color flow Doppler.  LEFT VENTRICLE PLAX 2D LVIDd:         3.70 cm  Diastology LVIDs:         2.40 cm  LV e' medial:    3.70 cm/s LV PW:         0.80 cm  LV E/e' medial:  13.3 LV IVS:        1.20 cm  LV e' lateral:   7.51 cm/s LVOT diam:     2.20 cm  LV E/e' lateral: 6.6 LV SV:         69 LV SV Index:   37 LVOT Area:     3.80 cm  RIGHT VENTRICLE RV S prime:     14.60 cm/s TAPSE (M-mode): 1.8 cm LEFT ATRIUM             Index       RIGHT ATRIUM           Index LA diam:        3.10 cm 1.68 cm/m  RA Area:     16.50 cm LA Vol (A2C):   40.0 ml 21.70 ml/m RA Volume:   36.00 ml  19.53 ml/m LA Vol (A4C):   79.7 ml 43.23 ml/m LA Biplane Vol: 57.0 ml 30.92 ml/m  AORTIC VALVE LVOT Vmax:   81.60 cm/s LVOT Vmean:  63.700 cm/s LVOT VTI:    0.181 m AI PHT:      688 msec  AORTA Ao Root diam: 3.60 cm Ao Asc diam:  3.40 cm MITRAL VALVE                TRICUSPID VALVE MV Area (PHT): 1.87 cm    TR Peak grad:   37.7 mmHg MV Decel Time: 405 msec    TR Vmax:        307.00 cm/s MV E velocity: 49.30 cm/s MV A velocity: 49.30 cm/s  SHUNTS MV E/A ratio:  1.00        Systemic VTI:  0.18 m                            Systemic Diam: 2.20 cm Zoila Shutter MD Electronically signed by Zoila Shutter MD Signature Date/Time: 05/06/2020/10:41:18 AM    Final    [place] LT  UPPER EXTREM LTD SOFT TISSUE NON VASCULAR  Result Date: 05/07/2020 CLINICAL DATA:  Bruising and swelling in the medial left upper arm and forearm. EXAM: ULTRASOUND left UPPER EXTREMITY LIMITED TECHNIQUE: Ultrasound examination of the upper extremity soft tissues was performed in the area of clinical concern. COMPARISON:  None. FINDINGS: In the left upper arm there is a elongated 23.5 x 5.7 x 5.0 cm fluid collection all along the entire length of the upper arm. Some internal echogenic debris but enhanced through transmission and no blood flow. The forearm area demonstrates edematous soft tissues but no discrete fluid collection. IMPRESSION: 1. Large elongated subcutaneous fluid collection involving the left upper extremity. This could be hematoma or abscess. 2. Diffuse inflammatory/edematous changes in the forearm area without discrete fluid collection. Electronically Signed   By: Rudie Meyer M.D.   On: 05/07/2020 13:38   CT HEAD CODE STROKE WO CONTRAST`  Result Date: 05/14/2020 CLINICAL DATA:  Code stroke. Delirium. Assess for stroke. Lethargy and slurred speech. EXAM: CT HEAD WITHOUT CONTRAST TECHNIQUE: Contiguous axial images were obtained from the base of the skull through the vertex without intravenous contrast. COMPARISON:  CT head May 05, 2020 FINDINGS: Brain: No acute intracranial hemorrhage, mass effect, or midline shift. Similar patchy white matter hypoattenuation, likely secondary to chronic microvascular ischemic disease. No evidence of acute large vascular territory infarct. Similar remote  infarct in the pons and left cerebellum. No hydrocephalus. No mass effect. No mass lesion. Vascular: Calcific atherosclerosis. Skull: Normal. Negative for fracture or focal lesion. Sinuses/Orbits: No acute finding. Other: No mastoid effusions. ASPECTS [facility] Stroke Program Early CT Score) Total score (0-10 with 10 being normal): 10 IMPRESSION: 1. No evidence of acute intracranial abnormality. ASPECTS is 10 2. 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 3. Similar chronic microvascular ischemic disease. Code stroke imaging results were communicated on 05/14/2020 at 1:17 pm to provider Dr. Thomasena Edis via telephone, who verbally acknowledged these results. Electronically Signed   By: Feliberto Harts MD   On: 05/14/2020 13:21   VAS [place] LOWER EXTREMITY VENOUS (DVT)  Result Date: 05/07/2020  Lower Venous DVT Study Indications: Pulmonary embolism, and Edema.  Limitations: Patient's inability to rotate leg. Comparison Study: Prior study done 02/20/20 and is available for comparison Performing Technologist: Sherren Kerns RVS  Examination Guidelines: A complete evaluation includes B-mode imaging, spectral Doppler, color Doppler, and power Doppler as needed of all accessible portions of each vessel. Bilateral testing is considered an integral part of a complete examination. Limited examinations for reoccurring indications may be performed as noted. The reflux portion of the exam is performed with the patient in reverse Trendelenburg.  +---------+---------------+---------+-----------+----------+--------------+ RIGHT    CompressibilityPhasicitySpontaneityPropertiesThrombus Aging +---------+---------------+---------+-----------+----------+--------------+ CFV      Full           Yes      Yes                                 +---------+---------------+---------+-----------+----------+--------------+ SFJ      Full                                                         +---------+---------------+---------+-----------+----------+--------------+ FV Prox  Full                                                        +---------+---------------+---------+-----------+----------+--------------+  FV Mid   Full                                                        +---------+---------------+---------+-----------+----------+--------------+ FV DistalFull                                                        +---------+---------------+---------+-----------+----------+--------------+ PFV      Full                                                        +---------+---------------+---------+-----------+----------+--------------+ POP      None           No       No                   Acute          +---------+---------------+---------+-----------+----------+--------------+ PTV      None           No       No                   Acute          +---------+---------------+---------+-----------+----------+--------------+ PERO     None           No       No                   Acute          +---------+---------------+---------+-----------+----------+--------------+   +---------+---------------+---------+-----------+----------+-------------------+ LEFT     CompressibilityPhasicitySpontaneityPropertiesThrombus Aging      +---------+---------------+---------+-----------+----------+-------------------+ CFV      Full           Yes      Yes                                      +---------+---------------+---------+-----------+----------+-------------------+ SFJ      Full                                                             +---------+---------------+---------+-----------+----------+-------------------+ FV Prox  Full                                                             +---------+---------------+---------+-----------+----------+-------------------+ FV Mid   Full                                                              +---------+---------------+---------+-----------+----------+-------------------+  FV DistalFull                                                             +---------+---------------+---------+-----------+----------+-------------------+ PFV      Full                                                             +---------+---------------+---------+-----------+----------+-------------------+ POP                     Yes      Yes                  patent by color and                                                       Doppler             +---------+---------------+---------+-----------+----------+-------------------+ PTV      None                                         Acute               +---------+---------------+---------+-----------+----------+-------------------+ PERO     None                                         Acute               +---------+---------------+---------+-----------+----------+-------------------+     Summary: RIGHT: - Findings consistent with acute deep vein thrombosis involving the right popliteal vein, right posterior tibial veins, and right peroneal veins. - A cystic structure is found in the popliteal fossa.  LEFT: - Findings consistent with acute deep vein thrombosis involving the left posterior tibial veins, and left peroneal veins.  *See table(s) above for measurements and observations. Electronically signed by Waverly Ferrari MD on 05/07/2020 at 7:12:53 AM.    Final    VAS [place] UPPER EXTREMITY VENOUS DUPLEX  Result Date: 05/21/2020 UPPER VENOUS STUDY  Indications: Swelling, and Pain Limitations: Patient position, restricted mobility. Comparison Study: No prior study Performing Technologist: Gertie Fey MHA, RDMS, RVT, RDCS  Examination Guidelines: A complete evaluation includes B-mode imaging, spectral Doppler, color Doppler, and power Doppler as needed of all accessible portions of each vessel. Bilateral testing is considered an  integral part of a complete examination. Limited examinations for reoccurring indications may be performed as noted.  Left Findings: +----------+------------+---------+-----------+----------+--------------+ LEFT      CompressiblePhasicitySpontaneousProperties   Summary     +----------+------------+---------+-----------+----------+--------------+ IJV  Not visualized +----------+------------+---------+-----------+----------+--------------+ Subclavian    Full       Yes       Yes                             +----------+------------+---------+-----------+----------+--------------+ Axillary                                            Not visualized +----------+------------+---------+-----------+----------+--------------+ Brachial      Full       Yes       Yes                             +----------+------------+---------+-----------+----------+--------------+ Radial        Full                                                 +----------+------------+---------+-----------+----------+--------------+ Ulnar                                               Not visualized +----------+------------+---------+-----------+----------+--------------+ Cephalic      Full                                                 +----------+------------+---------+-----------+----------+--------------+ Basilic                                             Not visualized +----------+------------+---------+-----------+----------+--------------+  Summary:  Left: No obvious evidence of deep or superficial vein thrombosis involving the visualized veins of the left upper extremity. There is a mostly cystic area of the left medial mid upper arm measuring at least 5.2cm; suggestive of possible hematoma versus seroma versus unknown etiology.  *See table(s) above for measurements and observations.  Diagnosing physician: Lemar Livings MD Electronically  signed by Lemar Livings MD on 05/21/2020 at 11:33:08 AM.    Final    CT ANGIO HEAD CODE STROKE  Result Date: 05/14/2020 CLINICAL DATA:  Stroke follow-up. Altered mental status. Known pulmonary embolus. EXAM: CT ANGIOGRAPHY HEAD AND NECK TECHNIQUE: Multidetector CT imaging of the head and neck was performed using the standard protocol during bolus administration of intravenous contrast. Multiplanar CT image reconstructions and MIPs were obtained to evaluate the vascular anatomy. Carotid stenosis measurements (when applicable) are obtained utilizing NASCET criteria, using the distal internal carotid diameter as the denominator. CONTRAST:  75mL OMNIPAQUE IOHEXOL 350 MG/ML SOLN COMPARISON:  CTA 02/16/2020 FINDINGS: CTA NECK FINDINGS Aortic arch: Aortic atherosclerosis. Great vessel origins are patent. Right carotid system: Calcified plaque at the bifurcation without significant stenosis. Mild irregularity of the internal carotid artery at the skull base, similar to prior. Left carotid system: Calcified plaque at the bifurcation without significant stenosis. Mild irregularity of the internal carotid artery at the skull base, similar to prior. Vertebral arteries: Mildly left dominant.  Mild calcific atherosclerosis at the right origin. No evidence of significant stenosis or occlusion. Skeleton: Degenerative changes of the cervical spine. Other neck: No mass or adenopathy. Upper chest: Large bilateral acute pulmonary embolus, better characterized on recent CTA chest Review of the MIP images confirms the above findings CTA HEAD FINDINGS Anterior circulation: No significant stenosis, proximal occlusion, aneurysm, or vascular malformation. Posterior circulation: The basilar artery is patent. Bilateral posterior cerebral arteries are patent and appear similar to prior. Tortuous basilar artery. Left fetal type PCA. No evidence of significant (greater than 50%) stenosis or occlusion. Similar appearance relative to prior.  Venous sinuses: As permitted by contrast timing, patent. Review of the MIP images confirms the above findings IMPRESSION: 1. No evidence of large vessel occlusion or hemodynamically significant stenosis. 2. Large bilateral acute pulmonary embolus, better characterized on recent CTA chest Code stroke imaging results were communicated on 05/14/2020 at 1:42 pm to provider Dr. Thomasena Edis via telephone, who verbally acknowledged these results. Electronically Signed   By: Feliberto Harts MD   On: 05/14/2020 13:43   CT ANGIO NECK CODE STROKE  Result Date: 05/14/2020 CLINICAL DATA:  Stroke follow-up. Altered mental status. Known pulmonary embolus. EXAM: CT ANGIOGRAPHY HEAD AND NECK TECHNIQUE: Multidetector CT imaging of the head and neck was performed using the standard protocol during bolus administration of intravenous contrast. Multiplanar CT image reconstructions and MIPs were obtained to evaluate the vascular anatomy. Carotid stenosis measurements (when applicable) are obtained utilizing NASCET criteria, using the distal internal carotid diameter as the denominator. CONTRAST:  6mL OMNIPAQUE IOHEXOL 350 MG/ML SOLN COMPARISON:  CTA 02/16/2020 FINDINGS: CTA NECK FINDINGS Aortic arch: Aortic atherosclerosis. Great vessel origins are patent. Right carotid system: Calcified plaque at the bifurcation without significant stenosis. Mild irregularity of the internal carotid artery at the skull base, similar to prior. Left carotid system: Calcified plaque at the bifurcation without significant stenosis. Mild irregularity of the internal carotid artery at the skull base, similar to prior. Vertebral arteries: Mildly left dominant. Mild calcific atherosclerosis at the right origin. No evidence of significant stenosis or occlusion. Skeleton: Degenerative changes of the cervical spine. Other neck: No mass or adenopathy. Upper chest: Large bilateral acute pulmonary embolus, better characterized on recent CTA chest Review of the MIP  images confirms the above findings CTA HEAD FINDINGS Anterior circulation: No significant stenosis, proximal occlusion, aneurysm, or vascular malformation. Posterior circulation: The basilar artery is patent. Bilateral posterior cerebral arteries are patent and appear similar to prior. Tortuous basilar artery. Left fetal type PCA. No evidence of significant (greater than 50%) stenosis or occlusion. Similar appearance relative to prior. Venous sinuses: As permitted by contrast timing, patent. Review of the MIP images confirms the above findings IMPRESSION: 1. No evidence of large vessel occlusion or hemodynamically significant stenosis. 2. Large bilateral acute pulmonary embolus, better characterized on recent CTA chest Code stroke imaging results were communicated on 05/14/2020 at 1:42 pm to provider Dr. Thomasena Edis via telephone, who verbally acknowledged these results. Electronically Signed   By: Feliberto Harts MD   On: 05/14/2020 13:43    Labs:  Basic Metabolic Panel: Recent Labs  Lab 05/22/20 0541 05/23/20 0506 05/24/20 0639  NA 137 137 139  K 4.8 4.7 5.6*  CL 103 103 105  CO2 22 24 21*  GLUCOSE 138* 122* 78  BUN 83* 86* 103*  CREATININE 2.69* 2.56* 3.30*  CALCIUM 8.6* 8.4* 8.4*    CBC: Recent Labs  Lab 05/19/20 1147 05/22/20 0541 05/23/20 0506  WBC 9.4 14.3* 13.3*  NEUTROABS  --   --  10.7*  HGB 9.7* 9.5* 9.2*  HCT 31.6* 31.1* 29.7*  MCV 99.7 98.1 98.7  PLT 192 164 158    CBG: No results for input(s): GLUCAP in the last 168 hours.  Brief HPI:   [name] is a 79 y.o. right-handed female with history of DVT pulmonary emboli, cardiomyopathy, hypertension and OSA was admitted with syncopal episode found to have submassive pulmonary emboli with saddle emboli and heart strain.  She received TPA developed left arm hematoma at the infusion site requiring transfusion.  Anticoagulation was held due to the anemia.  IVC filter placed 05/10/2020.  Patient was admitted for a  comprehensive rehab program.   Hospital Course: [name] was admitted to rehab 05/13/2020 for inpatient therapies to consist of PT, ST and OT at least three hours five days a week. Past admission physiatrist, therapy team and rehab RN have worked together to provide customized collaborative inpatient rehab.  Pertaining to patient's debility related to pulmonary emboli IVC filter in place.  Left upper extremity Doppler showed a cystic structure 5.2 cm seroma versus hematoma.  No current plan for anticoagulation.  Noted dementia she remained on Aricept as well as memantine.  Limited participation with therapies.  Right hip cellulitis maintained on Keflex recently been on vancomycin.  Noted hospital course AKI creatinine increasing to 2.69 renal services consulted and IV fluids initiated.  It was a thoughts of renal services patient possibly approaching need for hemodialysis with latest creatinine 3.30.  She was transferred to acute care services for ongoing management.   Blood pressures were monitored on TID basis and soft and monitored   Rehab course: During patient's stay in rehab weekly team conferences were held to monitor patient's progress, set goals and discuss barriers to discharge. At admission, patient required total assist stand pivot transfers +2 physical assist sit to stand max assist upper body bathing total assist lower body bathing  Physical exam.  Blood pressure 123/63 pulse 65 temperature 94 respirations 18 HEENT.  Head normocephalic and atraumatic Eyes.  Pupils round and reactive to light no discharge nystagmus Cardiac regular rate rhythm without any extra sounds or murmur heard Abdomen.  Soft nontender positive bowel sounds without rebound Respiratory effort normal no respiratory distress without wheeze Neurological.  Mood is flat she does provide yes no answers.  [name]  has had improvement in activity tolerance, balance, postural control as well as ability to compensate  for deficits. [name] has had improvement in functional use RUE/LUE  and RLE/LLE as well as improvement in awareness.  Requires mod max assist for bed mobility max assist transfers inconsistent ambulate 13 feet       Disposition: Discharged to acute care services   Diet: Dysphagia #1 thin liquids  Special Instructions: Medications will be addressed as per [facility] hospitalist  30-35 minutes were spent completing discharge planning  Discharge Instructions     Ambulatory referral to Physical Medicine Rehab   Complete by: As directed    1-2 weeks TC appt        Follow-up Information     [name], MD Follow up.   Specialty: Internal Medicine Contact information: [address] [place] [address] [phone]         Jodelle Red, MD .   Specialty: Cardiology Contact information: [address] [place] [address] [place] [address] [phone]         Marcello Fennel, MD Follow up.   Specialty: Physical Medicine and  Rehabilitation Why: Office will call you with follow up appointment Contact information: [address] [place] [address] [place] [address] [phone]                 Signed: Charlton Amor 05/24/2020, 10:51 AM Patient was seen, face-face, and physical exam performed by me on day of discharge, greater than 30 minutes of total time spent.. Please see progress note from day of discharge as well.  Maryla Morrow, MD, ABPMR

## 2020-05-24 NOTE — H&P (Signed)
History and Physical    [name] GQQ:[id number] DOB: 1940-12-10 DOA: 05/24/2020  PCP: [name], MD Consultants:  Stacie Acres - podiatry; [facility] - cardiology; [facility] - orthopedics; Laneta Simmers - CVTS Patient coming from: CIR after prior hospitalization; NOK: Cindee Lame, [phone]  Chief Complaint: worsening renal failure  HPI: [name] is a 79 y.o. 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  Evaluation subsequently revealed saddle PE with cor pulmonale and she was treated with tPA. She had LUE hematoma requiring transfusion, and anticoagulation was subsequently discontinued.  She had R hip cellulitis treated with Vanc and E coli UTI treated with Keflex while hospitalized.  IVC filter was placed 9/29.  While in rehab, she has become increasingly lethargic and is no longer actively participating in therapy.  She has had progressive renal failure despite IVF infusion.  As such, CIR requested transfer back to the acute hospital.  At the time of my evaluation, the patient had just arrived from CIR to 25M.  She was pleasant and appeared to attempt to smile once but was overall very stoic.  She answered very limited questions.  She appears to deny pain.  Her DIL reports that she had passed out initially and they thinking it was a vagal response - but it was large DVT/PE.  She was doing fine until her left arm developed the hematoma and this caused terrible pain and she has been going downhill since then.  Following the CVA, her endurance wasn't that good, could only walk short distances.  The left eye issue is chronic and she wore a patch at night and has been putting medications in her eye.      Rehab Course:  Patient was admitted for acute saddle embolism and treated with tPA.  She developed L arm hematoma and had IVC filter placed on 9/29.  She was admitted to rehab and was  participating but has been increasingly weak and withdrawn and so unable to participate in therapy.  Review of Systems: Unable to effectively perform   Past Medical History:  Diagnosis Date  . Arthritis   . Asthma   . Chronic back pain   . Depression   . History of pulmonary embolus (PE)   . Hx-TIA (transient ischemic attack)   . Hyperlipemia   . Hypertension   . Personal history of DVT (deep vein thrombosis)   . Scoliosis   . Sleep apnea    cpap - settingsi at 2   . 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 ARTHROSCOPIC REPAIR ACL  02/03/2004  . IR IVC FILTER PLMT / S&I /IMG GUID/MOD SED  05/10/2020  . LEFT HEART CATH AND CORONARY ANGIOGRAPHY N/A 09/16/2019   Procedure: LEFT HEART CATH AND CORONARY ANGIOGRAPHY;  Surgeon: Yvonne Kendall, MD;  Location: [facility] INVASIVE CV LAB;  Service: Cardiovascular;  Laterality: N/A;  . LOOP RECORDER INSERTION N/A 02/21/2020   Procedure: LOOP RECORDER INSERTION;  Surgeon: Duke Salvia, MD;  Location: [facility] INVASIVE CV LAB;  Service: Cardiovascular;  Laterality: N/A;  . OOPHORECTOMY  1970  . SPINE SURGERY  03/31/2006  . TOTAL KNEE ARTHROPLASTY Left 05/17/2016   Procedure: LEFT TOTAL KNEE ARTHROPLASTY;  Surgeon: Kathryne Hitch, MD;  Location: [facility] ORS;  Service: Orthopedics;  Laterality: Left;  Adductor Block; Spinal to General  . TUBAL LIGATION  1983    Social History  Socioeconomic History  . Marital status: Widowed    Spouse name: Chrissie Noa  . Number of children: 2  . Years of education: 10  . Highest education level: Not on file  Occupational History    Employer: RETIRED  Tobacco Use  . Smoking status: Former Smoker    Packs/day: 1.00    Years: 10.00    Pack years: 10.00    Types: Cigarettes    Quit date: 08/12/1973    Years since quitting: 46.8  . Smokeless tobacco: Never Used  Vaping Use  . Vaping Use: Never used  Substance and Sexual Activity  . Alcohol use: No  . Drug  use: No  . Sexual activity: Not on file  Other Topics Concern  . Not on file  Social History Narrative   Patient lives at home with spouse.   Caffeine Use: occasionally   Social Determinants of Health   Financial Resource Strain:   . Difficulty of Paying Living Expenses: Not on file  Food Insecurity:   . Worried About Programme researcher, broadcasting/film/video in the Last Year: Not on file  . Ran Out of Food in the Last Year: Not on file  Transportation Needs:   . Lack of Transportation (Medical): Not on file  . Lack of Transportation (Non-Medical): Not on file  Physical Activity:   . Days of Exercise per Week: Not on file  . Minutes of Exercise per Session: Not on file  Stress:   . Feeling of Stress : Not on file  Social Connections:   . Frequency of Communication with Friends and Family: Not on file  . Frequency of Social Gatherings with Friends and Family: Not on file  . Attends Religious Services: Not on file  . Active Member of Clubs or Organizations: Not on file  . Attends Banker Meetings: Not on file  . Marital Status: Not on file  Intimate Partner Violence:   . Fear of Current or Ex-Partner: Not on file  . Emotionally Abused: Not on file  . Physically Abused: Not on file  . Sexually Abused: Not on file    Allergies  Allergen Reactions  . T-Pa [Alteplase] Swelling    Does okay with it if premedicated with benadryl.  . Codeine Itching    Family History  Problem Relation Age of Onset  . Other Father        kidney problems    Prior to Admission medications   Medication Sig Start Date End Date Taking? Authorizing Provider  amLODipine (NORVASC) 10 MG tablet Take 10 mg by mouth daily. 03/02/20   [provider]  artificial tears (LACRILUBE) OINT ophthalmic ointment Place into the left eye at bedtime. Apply left eye bedtime 03/10/20   Angiulli, Mcarthur Rossetti, PA-C  aspirin EC 81 MG EC tablet Take 1 tablet (81 mg total) by mouth daily. 09/18/19   Duke, Roe Rutherford, PA   atorvastatin (LIPITOR) 20 MG tablet Take 1 tablet (20 mg total) by mouth at bedtime. 03/09/20   Angiulli, Mcarthur Rossetti, PA-C  busPIRone (BUSPAR) 30 MG tablet Take 1 tablet (30 mg total) by mouth 2 (two) times daily. 04/11/20   Cherie Ouch, PA-C  cholecalciferol (VITAMIN D3) 25 MCG (1000 UNIT) tablet Take 1 tablet (1,000 Units total) by mouth daily. 03/09/20   Angiulli, Mcarthur Rossetti, PA-C  cycloSPORINE (RESTASIS) 0.05 % ophthalmic emulsion Place 1 drop into both eyes daily as needed (dry eyes). 02/21/20   Layne Benton, NP  donepezil (ARICEPT) 10 MG  tablet Take 1 tablet (10 mg total) by mouth at bedtime. 03/09/20   Angiulli, Mcarthur Rossetti, PA-C  LINZESS 145 MCG CAPS capsule Take 145 mcg by mouth daily. 04/16/20   [provider]  lisinopril (ZESTRIL) 40 MG tablet Take 40 mg by mouth daily. 04/10/20   [provider]  melatonin 3 MG TABS tablet Take 1 tablet (3 mg total) by mouth at bedtime. 03/09/20   Angiulli, Mcarthur Rossetti, PA-C  memantine (NAMENDA XR) 28 MG CP24 24 hr capsule Take 1 capsule (28 mg total) by mouth daily. 03/09/20   Angiulli, Mcarthur Rossetti, PA-C  Multiple Vitamins-Minerals (MULTI-VITAMIN GUMMIES PO) Take 1 each by mouth daily.     [provider]  nitroGLYCERIN (NITROSTAT) 0.4 MG SL tablet Place 1 tablet (0.4 mg total) under the tongue every 5 (five) minutes as needed for chest pain. 09/17/19   Duke, Roe Rutherford, PA  oxyCODONE (ROXICODONE) 15 MG immediate release tablet Take 1 tablet (15 mg total) by mouth 4 (four) times daily as needed (chronic pain). 03/09/20   Angiulli, Mcarthur Rossetti, PA-C  polyethylene glycol (MIRALAX / GLYCOLAX) 17 g packet Take 17 g by mouth 2 (two) times daily. 03/09/20   Angiulli, Mcarthur Rossetti, PA-C  polyvinyl alcohol (LIQUIFILM TEARS) 1.4 % ophthalmic solution Place 2 drops into the left eye 4 (four) times daily. 02/21/20   Layne Benton, NP  senna-docusate (SENOKOT-S) 8.6-50 MG tablet Take 1 tablet by mouth at bedtime. 05/13/20   Dimple Nanas, MD    Physical  Exam: There were no vitals filed for this visit.  T 98.5 P58 RR 18 BP 108/58 93% on RA   . General:  Appears calm and comfortable and is NAD, very stoic . Eyes:  R eye with EOMI, L eye appears externally rotated and erythematous  . ENT:  grossly normal lips & tongue, mmm . Neck:  no LAD, masses or thyromegaly . Cardiovascular:  RRR, no m/r/g. No LE edema.  Marland Kitchen Respiratory:   CTA bilaterally with no wheezes/rales/rhonchi.  Normal respiratory effort. . Abdomen:  soft, NT, ND, NABS . Skin:  no rash or induration seen on limited exam . Musculoskeletal:  LUE appears edematous compared to R with significantly decreased tone, although patient has severe generalized weakness and so this is hard to say for sure . Psychiatric:  Very flat/stoic mood and affect, speech minimal but somewhat appropriate, AOx1 . Neurologic:  Unable to effectively assess    Radiological Exams on Admission: DG Chest 2 View  Result Date: 05/23/2020 CLINICAL DATA:  Leukocytosis EXAM: CHEST - 2 VIEW COMPARISON:  May 05, 2020 FINDINGS: There is atelectatic change in the left base. The lungs elsewhere are clear. Heart is upper normal in size with pulmonary vascularity within normal limits. There is aortic atherosclerosis. Loop recorder present on the left anteriorly. There is postoperative change in the lumbar spine. Bones are osteoporotic. IMPRESSION: Left base atelectasis. Lungs otherwise clear. Heart upper normal in size. No appreciable adenopathy. Aortic Atherosclerosis (ICD10-I70.0). Electronically Signed   By: Bretta Bang III M.D.   On: 05/23/2020 13:30   US RENAL  Result Date: 05/22/2020 CLINICAL DATA:  Acute kidney injury EXAM: RENAL / URINARY TRACT ULTRASOUND COMPLETE COMPARISON:  CT abdomen pelvis 05/09/2020 FINDINGS: Right Kidney: Renal measurements: 9.4 x 4.4 x 5.1 cm = volume: 110.2 mL. Echogenicity is within normal limits. No concerning renal mass, shadowing calculus or hydronephrosis. Left Kidney:  Renal measurements: 7.1 x 6.2 x 5.1 cm = volume: 167.4 mL. Echogenicity is  within normal limits. No concerning renal mass, shadowing calculus or hydronephrosis. Bladder: Bladder is suboptimally distended at the time of examination limiting sonographic evaluation. No gross abnormality though overall poorly assessed. Other: Technically challenging examination due to extensive bowel gas and inability to perform breath hold or reposition. IMPRESSION: 1. Technically challenging examination due to extensive bowel gas and inability to perform breath hold or reposition. 2. Grossly unremarkable appearance of the kidneys. 3. Bladder suboptimally distended at the time of examination without gross abnormality though overall quite poorly assessed. Electronically Signed   By: Kreg Shropshire M.D.   On: 05/22/2020 23:52    EKG: Independently reviewed.  NSR with rate 66; QTc 419; no evidence of acute ischemia   Labs on Admission: I have personally reviewed the available labs and imaging studies at the time of the admission.  Pertinent labs:   K+ 5.6 BUN 103/Creatinine 3.30/GFR 13; 8/2.5/17 on 10/12; normal on admission WBC 13.3 yesterday Hgb 9.2 yesterday - post-transfusion peak at 10.3 and slowly declining since   Assessment/Plan Active Problems:   Acute renal failure (ARF) (HCC)    Acute renal failure -Patient with normal renal function on 10/3, but creatinine 2.69 on 10/11 and has not improved since -Initial concern for hypovolemia but she appears adequately hydrated on exam -Also could have an ATN component associated with Vanc -Nephrology is consulting  -There is some consideration of HD, although she is currently a very poor long-term candidate for HD and likely also short-term -Palliative care consultation requested -Hold Lisinopril and other nephrotoxic medications  Neurologic deficits with h/o CVA -Previously on Plavix but this was held due to her arm hematoma -I have not previously evaluated  her, but on my exam she appeared to have a dense L facial and LUE hemiparesis -Will obtain MRI/MRA for further evaluation; if abnormal, needs neuro consult -If new CVA is present, this may further guide treatment towards a palliative approach since anticoagulation is problematic -ASA 81 mg daily with 20 mg Lipitor daily -Normal to low current BP - hold BP meds for this reason and possible CVA  Recent large bilateral acute pulmonary embolism with saddle emboli/at least submassive PE with cor pulmonale -Status post TPA and initially on heparin drip but complicated by left upper extremity hematoma/bleeding therefore heparin drip discontinued.  -IVC filter placed on 05/10/2020. -Plan is to rechallenge with anticoagulation about 2 weeks later, which is roughly now. -Echocardiogram shows EF of 60% with some right heart heart strain and tricuspid regurgitation.  Repeat echocardiogram outpatient in few weeks  Left upper extremity hematoma -Advised to keep her arm elevated.  -Pain medication and bowel regimen prescribed -Current UE findings could be associated with hematoma rather than CVA -Pain control with Oxycodone, fentanyl  Acute blood loss anemia -CT abdomen pelvis was previously negative for retroperitoneal bleed.   -After transfusion hemoglobin is slowly downtrending and needs ongoing monitoring  h/o E. Coli UTI and R hip cellulitis -Urine cultures with pansensitive E. coli, completed course of Keflex -Completed 5-day course of vancomycin for cellulitis  Takotsubo cardiomyopathy -Patient with apparent Takotsubo's based on severely depressed EF on echo 09/16/19 but cath with no significant CAD -As of 9/25, EF has rebounded to EF 60-65% and she has grade 1 diastolic dysfunction -No current evidence of decompensated CHF as cause for current presentation  Dementia/chronic anxiety -Continue home meds - Aricept, Namenda, BuSpar, melatonin    Note: This patient has been tested and  is negative for the novel coronavirus COVID-19.  DVT prophylaxis: SCDs Code Status: Limited - DNI - confirmed with family Family Communication: None present; I spoke with the patient's son and daughter-in-law by telephone at the time of admission. Disposition Plan:  The patient is from: home  Anticipated d/c is to: SNF  Anticipated d/c date will depend on clinical response to treatment, likely days to weeks  Patient is currently: acutely ill Consults called: Nephrology; Palliative care; Nutrition/PT/OT/ST; TOC team Admission status:  Admit - It is my clinical opinion that admission to INPATIENT is reasonable and necessary because of the expectation that this patient will require hospital care that crosses at least 2 midnights to treat this condition based on the medical complexity of the problems presented.  Given the aforementioned information, the predictability of an adverse outcome is felt to be significant.    Jonah Blue MD [facility]   How to contact the [facility] Attending or Consulting provider 7A - 7P or covering provider during after hours 7P -7A, for this patient?  1. Check the care team in [facility] and look for a) attending/consulting [facility] provider listed and b) the [facility] team listed 2. Log into [email or web address] and use [facility] universal password to access. If you do not have the password, please contact the hospital operator. 3. Locate the [facility] provider you are looking for under [facility] and page to a number that you can be directly reached. 4. If you still have difficulty reaching the provider, please page the [facility] (Director on Call) for the Hospitalists listed on amion for assistance.   05/24/2020, 4:00 PM

## 2020-05-25 DIAGNOSIS — Z9989 Dependence on other enabling machines and devices: Secondary | ICD-10-CM

## 2020-05-25 DIAGNOSIS — I10 Essential (primary) hypertension: Secondary | ICD-10-CM

## 2020-05-25 DIAGNOSIS — G4733 Obstructive sleep apnea (adult) (pediatric): Secondary | ICD-10-CM

## 2020-05-25 DIAGNOSIS — E43 Unspecified severe protein-calorie malnutrition: Secondary | ICD-10-CM | POA: Insufficient documentation

## 2020-05-25 DIAGNOSIS — E782 Mixed hyperlipidemia: Secondary | ICD-10-CM

## 2020-05-25 LAB — CBC
HCT: 28.2 % — ABNORMAL LOW (ref 36.0–46.0)
HCT: 29.2 % — ABNORMAL LOW (ref 36.0–46.0)
Hemoglobin: 8.5 g/dL — ABNORMAL LOW (ref 12.0–15.0)
Hemoglobin: 8.9 g/dL — ABNORMAL LOW (ref 12.0–15.0)
MCH: 30.1 pg (ref 26.0–34.0)
MCH: 30 pg (ref 26.0–34.0)
MCHC: 30.1 g/dL (ref 30.0–36.0)
MCHC: 30.5 g/dL (ref 30.0–36.0)
MCV: 100 fL (ref 80.0–100.0)
MCV: 98.3 fL (ref 80.0–100.0)
Platelets: 190 10*3/uL (ref 150–400)
Platelets: 208 10*3/uL (ref 150–400)
RBC: 2.82 MIL/uL — ABNORMAL LOW (ref 3.87–5.11)
RBC: 2.97 MIL/uL — ABNORMAL LOW (ref 3.87–5.11)
RDW: 14.9 % (ref 11.5–15.5)
RDW: 15 % (ref 11.5–15.5)
WBC: 10.2 10*3/uL (ref 4.0–10.5)
WBC: 8.9 10*3/uL (ref 4.0–10.5)
nRBC: 0 % (ref 0.0–0.2)
nRBC: 0 % (ref 0.0–0.2)

## 2020-05-25 LAB — BASIC METABOLIC PANEL
Anion gap: 11 (ref 5–15)
BUN: 90 mg/dL — ABNORMAL HIGH (ref 8–23)
CO2: 23 mmol/L (ref 22–32)
Calcium: 8.3 mg/dL — ABNORMAL LOW (ref 8.9–10.3)
Chloride: 107 mmol/L (ref 98–111)
Creatinine, Ser: 2.5 mg/dL — ABNORMAL HIGH (ref 0.44–1.00)
GFR, Estimated: 18 mL/min — ABNORMAL LOW (ref >60)
Glucose, Bld: 90 mg/dL (ref 70–99)
Potassium: 4.6 mmol/L (ref 3.5–5.1)
Sodium: 141 mmol/L (ref 135–145)

## 2020-05-25 LAB — HEPARIN LEVEL (UNFRACTIONATED): Heparin Unfractionated: 0.3 IU/mL (ref 0.30–0.70)

## 2020-05-25 MED ORDER — ENSURE ENLIVE PO LIQD
237.0000 mL | Freq: Three times a day (TID) | ORAL | Status: DC
  Administered 2020-05-25 – 2020-05-30 (×14): 237 mL via ORAL
  Filled 2020-05-25 (×2): qty 237

## 2020-05-25 MED ORDER — ADULT MULTIVITAMIN W/MINERALS CH
1.0000 | ORAL_TABLET | Freq: Every day | ORAL | Status: DC
  Administered 2020-05-26 – 2020-05-30 (×4): 1 via ORAL
  Filled 2020-05-25 (×5): qty 1

## 2020-05-25 MED ORDER — HEPARIN (PORCINE) 25000 UT/250ML-% IV SOLN
850.0000 [IU]/h | INTRAVENOUS | Status: AC
  Administered 2020-05-25: 800 [IU]/h via INTRAVENOUS
  Administered 2020-05-26: 850 [IU]/h via INTRAVENOUS
  Filled 2020-05-25 (×2): qty 250

## 2020-05-25 NOTE — Progress Notes (Signed)
PROGRESS NOTE  [name] YQM:[id number] DOB: 05/17/1941 DOA: 05/24/2020 PCP: [name], MD  Brief History   [name] is a 79 y.o. 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  Evaluation subsequently revealed saddle PE with cor pulmonale and she was treated with tPA. She had LUE hematoma requiring transfusion, and anticoagulation was subsequently discontinued.  She had R hip cellulitis treated with Vanc and E coli UTI treated with Keflex while hospitalized.  IVC filter was placed 9/29.  While in rehab, she has become increasingly lethargic and is no longer actively participating in therapy.  She has had progressive renal failure despite IVF infusion.  As such, CIR requested transfer back to the acute hospital.  At the time of my evaluation, the patient had just arrived from CIR to 4M.  She was pleasant and appeared to attempt to smile once but was overall very stoic.  She answered very limited questions.  She appears to deny pain.  Her DIL reports that she had passed out initially and they thinking it was a vagal response - but it was large DVT/PE.  She was doing fine until her left arm developed the hematoma and this caused terrible pain and she has been going downhill since then.  Following the CVA, her endurance wasn't that good, could only walk short distances.  The left eye issue is chronic and she wore a patch at night and has been putting medications in her eye.    Prior to rehab the patient was admitted for acute saddle embolism and treated with tPA.  She developed L arm hematoma and had IVC filter placed on 9/29.  She was admitted to rehab and was participating but has been increasingly weak and withdrawn and so unable to participate in therapy.  The patient has been found to have AKI with an increase in creatinine from baseline of 1.0 to 2.69. Likely due to ATN from vancomycin  and possibly poor PO intake. Nephrology has been consulted.   Consultants  . Nephrology  Procedures  . None  Antibiotics   Anti-infectives (From admission, onward)   None    .  Subjective  The patient is moribund and lethargic as she lies in bed. She complains of back pain that she states is new for her. She is unable to tell me where in her back she is having pain.  Objective   Vitals:  Vitals:   05/25/20 0906 05/25/20 1655  BP: (!) 121/58 126/65  Pulse: 60 62  Resp: 18 18  Temp: 98.8 F (37.1 C) 98.6 F (37 C)  SpO2: 93% 96%   Exam:  Constitutional:  . The patient is awake, alert, and oriented x 3. No acute distress. Respiratory:  . No increased work of breathing. . No wheezes, rales, or rhonchi . No tactile fremitus Cardiovascular:  . Regular rate and rhythm . No murmurs, ectopy, or gallups. . No lateral PMI. No thrills. Abdomen:  . Abdomen is soft, non-tender, non-distended . No hernias, masses, or organomegaly . Normoactive bowel sounds.  Musculoskeletal:  . No cyanosis, clubbing, or edema Skin:  . No rashes, lesions, ulcers . palpation of skin: no induration or nodules Neurologic:  She is moving all extremities. Responds verbally, but slowly. Pt is lethargic and confused. I have personally reviewed the following:   Today's Data  . Vitals, BMP, CBC, Glucoses  Scheduled Meds: . artificial tears   Left  Eye QHS  . aspirin EC  81 mg Oral Daily  . atorvastatin  20 mg Oral QHS  . busPIRone  30 mg Oral BID  . docusate sodium  100 mg Oral BID  . donepezil  10 mg Oral QHS  . feeding supplement  237 mL Oral TID BM  . linaclotide  145 mcg Oral Daily  . melatonin  3 mg Oral QHS  . memantine  28 mg Oral Daily  . multivitamin with minerals  1 tablet Oral Daily  . polyethylene glycol  17 g Oral BID  . polyvinyl alcohol  2 drop Left Eye QID  . senna-docusate  1 tablet Oral QHS  . sodium chloride flush  3 mL Intravenous Q12H   Continuous Infusions: .  heparin 800 Units/hr (05/25/20 1146)  . 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 Nephrology has been consulted. There is some consideration of HD, although she is currently a very poor long-term candidate for HD and likely also short-term. Palliative care consultation requested. Hold Lisinopril and other nephrotoxic medications. Avoid hypotension. Monitor creatinine, electrolytes, and volume status.   Neurologic deficits with h/o CVA: Previously on Plavix but this was held due to her arm hematoma. She has notable left sided facial weakness, but I understand that this has been present since her arrival at rehab. MRI has been performed and is negative for acute intracranial abnormality. 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 She continues to receive ASA 81 mg with 20 mg of lipitor daily.  Hypertension: The patient is normotensive without antihypertensive medications. Monitor.  Recent large bilateral acute pulmonary embolism with saddle emboli/at least submassive PE with cor pulmonale: The patient is status post TPA. She was initially on heparin drip. This therapy was complicated by left upper extremity hematoma/bleeding therefore heparin drip discontinued. She had an IVC filter placed on 05/01/2020. The plan had been to rechallenge with AC 2 weeks from this date. Phaarmacy has been consulted to place the patient  back on a heparin drip. Echocardiogram from 05/06/2020 shows EF of 60% with some right heart heart strain and tricuspid regurgitation.Repeat echocardiogram outpatient in few weeks.  Left upper extremity hematoma: Pt is to keep her arm elevated. She is receiving pain medication  (Oxycodone, fentanyl) and a bowel regimen has been prescribed. Current UE findings could be associated with hematoma rather than CVA.  Acute blood loss anemia: CT abdomen pelvis was previously negative for retroperitoneal bleed.After transfusion hemoglobin is slowly down-trending. Continue to monitor hemoglobin and check an anemia panel.  History of  E. Coli UTI and R hip cellulitis: She has completed a course of keflex for UTI and vancomycin for cellulitis.  Takotsubo cardiomyopathy: Noted. Patient with apparent Takotsubo's based on severely depressed EF on echo 09/16/19 but cath with no significant CAD. As of 9/25, EF has rebounded to EF 60-65% and she has grade 1 diastolic dysfunction No  current evidence of decompensated CHF as cause for current presentation.  Dementia/chronic anxiety: Continue home meds - Aricept, Namenda, BuSpar, melatonin. Palliative care consulted.  I have seen and examined this patient myself. I have spent 34 minutes in her evaluation and care.  DVT prophylaxis: SCDs Code Status: Limited - DNI - confirmed with family Family Communication: None present; I spoke with the patient's son and daughter-in-law by telephone at the time of admission. Disposition Plan:The patient is from: home             Anticipated d/c is to: SNF             Anticipated d/c date will depend on clinical response to treatment, likely days to weeks             Patient is currently: acutely ill   Redford Behrle, DO [facility] Direct contact: see [email or web address]  7PM-7AM contact night coverage as above 05/25/2020, 6:46 PM  LOS: 1 day

## 2020-05-25 NOTE — Evaluation (Signed)
Occupational Therapy Evaluation Patient Details Name: [name] MRN: [id number] DOB: 1941-04-24 Today's Date: 05/25/2020    History of Present Illness The pt is a 79 yo female presenting from CIR due to AKI with increasing  lethargy and creatine levels despite IVF infusion. The pt's was reccently admissted 9/24 due to acute saddle embolism, development of LUE hematoma, and palcement of IVC filter on 9/29. PMH includes: CVA in 02/2020, Takotsubo cardiomyopathy, DVT/PE, HTN, and obstructive sleep apnea.   Clinical Impression   PTA patient reports needing some assist for ADLs, receiving therapy in CIR and recently transferred back to acute care.  Patient limited historian during session. Patient remains limited by L UE pain/edema/decreased functional use, R back pain, impaired balance, decreased activity tolerance, generalized weakness, and decreased cognition. Requires max assist +2 for bed mobility, min-mod assist sitting EOB statically, min-total assist +2 for self care at EOB/ bed level (incontient of BM during session).  Repositioned in bed for comfort of L UE and R back.  Believe she will benefit from further OT services while admitted and after dc at SNF level to optimize return to PLOF prior to dc home.  Will follow.     Follow Up Recommendations  SNF;Supervision/Assistance - 24 hour    Equipment Recommendations  Other (comment) (TBD at next venue of care )    Recommendations for Other Services       Precautions / Restrictions Precautions Precautions: Fall Precaution Comments: L-sided residual walkness  Restrictions Weight Bearing Restrictions: No      Mobility Bed Mobility Overal bed mobility: Needs Assistance Bed Mobility: Rolling;Supine to Sit;Sit to Supine Rolling: Mod assist;Max assist   Supine to sit: Max assist;+2 for physical assistance Sit to supine: Max assist;+2 for physical assistance   General bed mobility comments: modA rolling to L, modA to initiate  reach, LE positioning, and roll. MaxA to roll to R due to pt inability to reach or coordinate roll. maxA of 2 to move towards EOB and to use bed pad to complete transition to and from sitting EOB  Transfers Overall transfer level: Needs assistance               General transfer comment: unable at this time due to incontinence of bowels in sitting position.     Balance Overall balance assessment: Needs assistance Sitting-balance support: Single extremity supported;Feet supported Sitting balance-Leahy Scale: Poor Sitting balance - Comments: lean to L, requires cues and modA to recover initally able to self correct with multimodal cueing but poor sustained sitting at midline Postural control: Left lateral lean                                 ADL either performed or assessed with clinical judgement   ADL Overall ADL's : Needs assistance/impaired     Grooming: Minimal assistance;Wash/dry face;Bed level   Upper Body Bathing: Maximal assistance;Sitting   Lower Body Bathing: Total assistance;+2 for safety/equipment;+2 for physical assistance;Bed level   Upper Body Dressing : Maximal assistance;Sitting   Lower Body Dressing: Total assistance;+2 for physical assistance;+2 for safety/equipment;Bed level     Toilet Transfer Details (indicate cue type and reason): deferred Toileting- Clothing Manipulation and Hygiene: Total assistance;+2 for physical assistance;+2 for safety/equipment;Bed level       Functional mobility during ADLs: Maximal assistance;Total assistance;+2 for physical assistance;+2 for safety/equipment;Cueing for safety;Cueing for sequencing General ADL Comments: patient limited by decrased functional use of L UE,  imapired balance, genearlized weakness      Vision   Additional Comments: reports blurry vision, further assessment required     Perception     Praxis      Pertinent Vitals/Pain Pain Assessment: Faces Faces Pain Scale: Hurts whole  lot Pain Location: LUE with movement (at shoulder), R back/flank with supine position in bed Pain Descriptors / Indicators: Discomfort;Grimacing;Nagging;Sore Pain Intervention(s): Monitored during session;Repositioned     Hand Dominance Right   Extremity/Trunk Assessment Upper Extremity Assessment Upper Extremity Assessment: Generalized weakness;LUE deficits/detail LUE Deficits / Details: pt guarding, edematous; limited AROM due to pain but PROM WFL to 90 shoulder flexion; grasp 3/5 LUE: Unable to fully assess due to pain LUE Sensation: WNL LUE Coordination: decreased fine motor;decreased gross motor   Lower Extremity Assessment Lower Extremity Assessment: Defer to PT evaluation RLE Deficits / Details: active movement at ankle (DF/PF), limited active knee flexion/heel slide in bed. needs significant assist to use legs for bed mobility LLE Deficits / Details: active movement at ankle (DF/PF), limited active knee flexion/heel slide in bed. needs significant assist to use legs for bed mobility   Cervical / Trunk Assessment Cervical / Trunk Assessment: Kyphotic;Other exceptions Cervical / Trunk Exceptions: large body habitus, L lean   Communication Communication Communication: No difficulties   Cognition Arousal/Alertness: Lethargic Behavior During Therapy: WFL for tasks assessed/performed Overall Cognitive Status: No family/caregiver present to determine baseline cognitive functioning Area of Impairment: Memory;Safety/judgement;Problem solving;Awareness;Following commands;Attention                   Current Attention Level: Sustained Memory: Decreased short-term memory Following Commands: Follows one step commands consistently;Follows one step commands with increased time;Follows multi-step commands inconsistently Safety/Judgement: Decreased awareness of safety Awareness: Emergent Problem Solving: Slow processing;Difficulty sequencing;Decreased initiation;Requires verbal  cues General Comments: slow processing, requires cueing for sequencing and problem solving; aware of need to have BM during session    General Comments  pt incontinent of loose stools in sitting, RN notified     Exercises Exercises: General Lower Extremity General Exercises - Lower Extremity Ankle Circles/Pumps: AAROM;Both;10 reps;Supine Heel Slides: AAROM;Both;5 reps;Supine   Shoulder Instructions      Home Living Family/patient expects to be discharged to:: Skilled nursing facility                                 Additional Comments: pt from CIR      Prior Functioning/Environment Level of Independence: Needs assistance  Gait / Transfers Assistance Needed: reports ambulating at rehab with asist unsure of AD used if any ADL's / Homemaking Assistance Needed: reports some assist with ADLs    Comments: granddaughter works but otherwise home with pt         OT Problem List: Decreased strength;Decreased range of motion;Decreased activity tolerance;Impaired balance (sitting and/or standing);Decreased safety awareness;Pain;Impaired UE functional use;Obesity;Increased edema;Decreased coordination;Decreased cognition;Impaired vision/perception;Decreased knowledge of use of DME or AE;Decreased knowledge of precautions      OT Treatment/Interventions: Self-care/ADL training;Therapeutic exercise;DME and/or AE instruction;Therapeutic activities;Patient/family education;Balance training;Cognitive remediation/compensation    OT Goals(Current goals can be found in the care plan section) Acute Rehab OT Goals Patient Stated Goal: reduce pain OT Goal Formulation: With patient Time For Goal Achievement: 06/08/20 Potential to Achieve Goals: Fair  OT Frequency: Min 2X/week   Barriers to D/C:            Co-evaluation PT/OT/SLP Co-Evaluation/Treatment: Yes Reason for Co-Treatment: For patient/therapist safety;To address functional/ADL  transfers PT goals addressed during  session: Mobility/safety with mobility;Strengthening/ROM OT goals addressed during session: ADL's and self-care      AM-PAC OT "6 Clicks" Daily Activity     Outcome Measure Help from another person eating meals?: A Little Help from another person taking care of personal grooming?: A Lot Help from another person toileting, which includes using toliet, bedpan, or urinal?: Total Help from another person bathing (including washing, rinsing, drying)?: Total Help from another person to put on and taking off regular upper body clothing?: A Lot Help from another person to put on and taking off regular lower body clothing?: Total 6 Click Score: 10   End of Session Nurse Communication: Mobility status  Activity Tolerance: Patient tolerated treatment well Patient left: in bed;with call bell/phone within reach;with bed alarm set;with SCD's reapplied  OT Visit Diagnosis: Muscle weakness (generalized) (M62.81);History of falling (Z91.81);Pain;Other abnormalities of gait and mobility (R26.89);Other symptoms and signs involving cognitive function Pain - Right/Left: Left Pain - part of body: Arm                Time: 6063-0160 OT Time Calculation (min): 34 min Charges:  OT General Charges $OT Visit: 1 Visit OT Evaluation $OT Eval Moderate Complexity: 1 Mod  Barry Brunner, OT Acute Rehabilitation Services Pager [phone] Office [phone]   Chancy Milroy 05/25/2020, 3:32 PM

## 2020-05-25 NOTE — Progress Notes (Signed)
ANTICOAGULATION CONSULT NOTE - Initial Consult  Pharmacy Consult for Heparin  Indication:  H/o saddle PE (05/05/2020)  Allergies  Allergen Reactions  . T-Pa [Alteplase] Swelling    Does okay with it if premedicated with benadryl.  . Codeine Itching    Patient Measurements: Height: 5\' 2"  (157.5 cm) (05/13/2020) Weight: 93 kg (205 lb 0.4 oz) (05/13/2020) IBW/kg (Calculated) : 50.1 Heparin Dosing Weight: 71.7 kg  Vital Signs: Temp: 98.8 F (37.1 C) (10/14 0906) BP: 121/58 (10/14 0906) Pulse Rate: 60 (10/14 0906)  Labs: Recent Labs    05/23/20 0506 05/24/20 0639 05/25/20 0146  HGB 9.2*  --  8.5*  HCT 29.7*  --  28.2*  PLT 158  --  190  CREATININE 2.56* 3.30* 2.50*    Estimated Creatinine Clearance: 19.4 mL/min (A) (by C-G formula based on SCr of 2.5 mg/dL (H)).   Medical History: Past Medical History:  Diagnosis Date  . Arthritis   . Asthma   . Chronic back pain   . Depression   . History of pulmonary embolus (PE)   . Hx-TIA (transient ischemic attack)   . Hyperlipemia   . Hypertension   . Personal history of DVT (deep vein thrombosis)   . Scoliosis   . Sleep apnea    cpap - settingsi at 3   . Stroke (HCC)   . 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 amLODipine (NORVASC) 10 MG tablet Take 10 mg by mouth daily.   05/04/2020  . artificial tears (LACRILUBE) OINT ophthalmic ointment Place into the left eye at bedtime. Apply left eye bedtime 1 Tube 1 05/03/2020  . atorvastatin (LIPITOR) 20 MG tablet Take 1 tablet (20 mg total) by mouth at bedtime. 30 tablet 0 05/03/2020  . busPIRone (BUSPAR) 30 MG tablet Take 1 tablet (30 mg total) by mouth 2 (two) times daily. (Patient taking differently: Take 15 mg by mouth 2 (two) times daily. ) 180 tablet 0 05/04/2020  . cholecalciferol (VITAMIN D3) 25 MCG (1000 UNIT) tablet Take 1 tablet (1,000 Units total) by mouth daily. 30 tablet 0  05/04/2020  . clopidogrel (PLAVIX) 75 MG tablet Take 75 mg by mouth daily.   05/04/2020  . cycloSPORINE (RESTASIS) 0.05 % ophthalmic emulsion Place 1 drop into both eyes daily as needed (dry eyes). 0.4 mL  unk at prn  . donepezil (ARICEPT) 10 MG tablet Take 1 tablet (10 mg total) by mouth at bedtime. 30 tablet 0 05/03/2020  . fexofenadine (ALLEGRA) 180 MG tablet Take 180 mg by mouth daily.   04/27/2020  . LINZESS 145 MCG CAPS capsule Take 145 mcg by mouth daily.   05/04/2020  . lisinopril (ZESTRIL) 5 MG tablet Take 5 mg by mouth daily.   05/04/2020  . memantine (NAMENDA XR) 28 MG CP24 24 hr capsule Take 1 capsule (28 mg total) by mouth daily. (Patient taking differently: Take 28 mg by mouth at bedtime. ) 30 capsule 0 05/03/2020  . mirtazapine (REMERON) 15 MG tablet Take 15 mg by mouth at bedtime as needed (for sleep).   unk at prn  . Multiple Vitamins-Minerals (MULTI-VITAMIN GUMMIES PO) Take 1 each by mouth daily.    05/04/2020  . nitroGLYCERIN (NITROSTAT) 0.4 MG SL tablet Place 1 tablet (0.4 mg total) under the tongue every 5 (five) minutes as needed for chest pain. (Patient taking differently: Place 0.4 mg under the tongue every 5 (five) minutes as needed for chest  pain (max 3 doses). ) 25 tablet 2 unk at prn  . oxyCODONE (ROXICODONE) 15 MG immediate release tablet Take 1 tablet (15 mg total) by mouth 4 (four) times daily as needed (chronic pain). (Patient taking differently: Take 7.5-15 mg by mouth See admin instructions. Take 0.5 tablet (7.5 mg totally) by mouth every 4 hours; if no relief, take 1 tablet (15 mg totally) by mouth 4 times daily) 30 tablet 0 05/04/2020  . zinc gluconate 50 MG tablet Take 25 mg by mouth daily.   05/04/2020  . aspirin EC 81 MG EC tablet Take 1 tablet (81 mg total) by mouth daily. (Patient not taking: Reported on 05/24/2020) 90 tablet 3 Not Taking at Unknown time  . melatonin 3 MG TABS tablet Take 1 tablet (3 mg total) by mouth at bedtime. (Patient not taking: Reported on  05/24/2020) 30 tablet 0 Not Taking at Unknown time  . polyethylene glycol (MIRALAX / GLYCOLAX) 17 g packet Take 17 g by mouth 2 (two) times daily. (Patient not taking: Reported on 05/24/2020) 14 each 0 Not Taking at Unknown time  . polyvinyl alcohol (LIQUIFILM TEARS) 1.4 % ophthalmic solution Place 2 drops into the left eye 4 (four) times daily. (Patient not taking: Reported on 05/24/2020) 15 mL 0 Not Taking at Unknown time  . senna-docusate (SENOKOT-S) 8.6-50 MG tablet Take 1 tablet by mouth at bedtime. (Patient not taking: Reported on 05/24/2020)   Not Taking at Unknown time   Scheduled:  . artificial tears   Left Eye QHS  . aspirin EC  81 mg Oral Daily  . atorvastatin  20 mg Oral QHS  . busPIRone  30 mg Oral BID  . docusate sodium  100 mg Oral BID  . donepezil  10 mg Oral QHS  . linaclotide  145 mcg Oral Daily  . melatonin  3 mg Oral QHS  . memantine  28 mg Oral Daily  . polyethylene glycol  17 g Oral BID  . polyvinyl alcohol  2 drop Left Eye QID  . senna-docusate  1 tablet Oral QHS  . sodium chloride flush  3 mL Intravenous Q12H    Assessment: 79 y.o female with recent diagnosis of saddle embolus in 04/2020, on previous [facility] hospital admission 05/05/20-05/13/20.  Developed Rt UE hematoma on heparin. AC stopped and IVC placed. Plan was to rechallenge with [facility] after two weeks.  She was discharged on 05/13/20 to [facility] inpatient rehab (CIR).  She became increasingly lethargic, no longer actively participating in therapy and progressive renal failure despite IVF infusion , thus CIR requested transfer back to the acute hospital.   Pharmacy consulted to start IV heparin drip with no bolus. She last received sq heparin 5k on 10/13 @1330  in CIR.  On her previous [facility] admission 04/2420, she was last on Heparin infusion at rate of 850 to 800 ut/hr with therapeutic heparin level 9/25-9/28/21 until  discontinued due to RUE hematoma.  H/H down to 8.5/28.2 and pltc is within normal/ stable.  No bleeding  reported.    Goal of Therapy:  Heparin level  0.3-0.5 units/hr due to recent RUE hematoma Monitor platelets by anticoagulation protocol: Yes   Plan:  No bolus (per MD orders) Start IV heparin infusion at 800 units/hr (~ 11 units/kg/hr)  Check 8 hr heparin level Daily HL and CBC  11-10-1978, RPh Clinical Pharmacist [phone] Please check AMION for all [facility] phone numbers After 10:00 PM, call Main Pharmacy [phone] 05/25/2020,10:06 AM

## 2020-05-25 NOTE — Progress Notes (Signed)
Patient ID: [name], female   DOB: 1941-07-24, 79 y.o.   MRN: [id number]       Thank you for this consult. Chart reviewed. Discussed with bedside RN. No family at bedside.  Called son [name] in efforts to arrange family meeting. No answer; left a message a requested a return call.    Sherlean Foot, NP-C Palliative Medicine   Please call Palliative Medicine team phone with any questions [phone]. For individual providers please see AMION.   No charge

## 2020-05-25 NOTE — Progress Notes (Signed)
ANTICOAGULATION CONSULT NOTE - Follow Up Consult  Pharmacy Consult for IV Heparin Indication: History of Saddle PE (05/05/2020)  Allergies  Allergen Reactions  . T-Pa [Alteplase] Swelling    Does okay with it if premedicated with benadryl.  . Codeine Itching    Patient Measurements: Height: 5\' 2"  (157.5 cm) (05/13/2020) Weight: 93 kg (205 lb 0.4 oz) (05/13/2020) IBW/kg (Calculated) : 50.1 Heparin Dosing Weight: 71.7 kg  Vital Signs: Temp: 98.4 F (36.9 C) (10/14 2057) Temp Source: Oral (10/14 1655) BP: 120/59 (10/14 2057) Pulse Rate: 70 (10/14 2057)  Labs: Recent Labs    05/23/20 0506 05/23/20 0506 05/24/20 0639 05/25/20 0146 05/25/20 1952  HGB 9.2*   < >  --  8.5* 8.9*  HCT 29.7*  --   --  28.2* 29.2*  PLT 158  --   --  190 208  HEPARINUNFRC  --   --   --   --  0.30  CREATININE 2.56*  --  3.30* 2.50*  --    < > = values in this interval not displayed.    Estimated Creatinine Clearance: 19.4 mL/min (A) (by C-G formula based on SCr of 2.5 mg/dL (H)).   Medications:  Infusions:  . heparin 800 Units/hr (05/25/20 1146)  . lactated ringers 75 mL/hr at 05/25/20 1103    Assessment: 79 years of age female with recent diagnosis of saddle pulmonary embolus on 05/05/2020 who developed a right upper extremity hematoma on heparin during that admission and anticoagulation was discontinued and IVC filter placed. Plan was to re-challenge anticoagulation after 2 weeks. Patient has been re-admitted from rehab with lethargy, altered mental status, and renal failure. Pharmacy was consulted to start IV heparin drip with no bolus.   Heparin was resumed at low rate of 800 units/hr.  Initial heparin level is therapeutic at 0.3 for goal of 0.3 to 0.5 on this rate.  Confirmed with RN - no overt bleeding noted. Some bruising- noted on admission.  H/H is low but stable - no acute decrease. Platelets are stable and within normal limits.   Goal of Therapy:  Heparin level 0.3-0.7  units/ml Monitor platelets by anticoagulation protocol: Yes   Plan:  Increase Heparin to 850 units/hr.  Heparin level in 8 hours - with AM labs.   05/07/2020, PharmD, BCPS, BCCCP Clinical Pharmacist Please refer to [facility] for [facility] Pharmacy numbers 05/25/2020,9:19 PM

## 2020-05-25 NOTE — Progress Notes (Signed)
Patient refused to eat dinner on dysphagia 1 diet and stated "I do not want to eat that". RN educated patient and will continue to monitor this patient. RN was offered a boost breeze and drank this container.

## 2020-05-25 NOTE — Evaluation (Signed)
Physical Therapy Evaluation Patient Details Name: [name] MRN: [id number] DOB: 01-11-1941 Today's Date: 05/25/2020   History of Present Illness  The pt is a 79 yo female presenting from CIR due to AKI with increasing  lethargy and creatine levels despite IVF infusion. The pt's was reccently admissted 9/24 due to acute saddle embolism, development of LUE hematoma, and palcement of IVC filter on 9/29. PMH includes: CVA in 02/2020, Takotsubo cardiomyopathy, DVT/PE, HTN, and obstructive sleep apnea.    Clinical Impression  Pt in bed upon arrival of PT, agreeable to evaluation at this time. Prior to admission the pt was at [facility] receiving therapy, and had reached modA for transfers and had initiated gait training. The pt now presents with limitations in functional mobility, strength, ROM, and activity tolerance due to above dx and significant pain in her LUE and R side of her back. The pt will continue to benefit from skilled PT to address these deficits. She was limited to bed mobility and transition to sitting EOB at this time due incontinence of stool in sitting, and need for maxA to return to bed for cleaning. The pt will benefit from continued skilled PT to progress functional strength, mobility, and activity tolerance prior to return home.      Follow Up Recommendations SNF;Supervision/Assistance - 24 hour    Equipment Recommendations   (defer to post acute, mechanical lift if home)    Recommendations for Other Services       Precautions / Restrictions Precautions Precautions: Fall Precaution Comments: L-sided residual walkness  Restrictions Weight Bearing Restrictions: No      Mobility  Bed Mobility Overal bed mobility: Needs Assistance Bed Mobility: Rolling;Supine to Sit;Sit to Supine Rolling: Mod assist;Max assist   Supine to sit: Max assist;+2 for physical assistance Sit to supine: Max assist;+2 for physical assistance   General bed mobility comments: modA rolling  to L, modA to initiate reach, LE positioning, and roll. MaxA to roll to R due to pt inability to reach or coordinate roll. maxA of 2 to move towards EOB and to use bed pad to complete transition to and from sitting EOB  Transfers Overall transfer level: Needs assistance               General transfer comment: unable at this time due to incontinence of bowels in sitting position.       Balance Overall balance assessment: Needs assistance Sitting-balance support: Single extremity supported;Feet supported Sitting balance-Leahy Scale: Poor Sitting balance - Comments: lean to L, requires cues and modA to recover Postural control: Left lateral lean                                   Pertinent Vitals/Pain Pain Assessment: Faces Faces Pain Scale: Hurts whole lot Pain Location: LUE with movement (at shoulder), R back/flank with supine position in bed Pain Descriptors / Indicators: Discomfort;Grimacing;Nagging;Sore Pain Intervention(s): Monitored during session;Repositioned    Home Living Family/patient expects to be discharged to:: Skilled nursing facility                 Additional Comments: pt from CIR    Prior Function Level of Independence: Needs assistance   Gait / Transfers Assistance Needed: reports ambulating at rehab with asist unsure of AD used if any  ADL's / Homemaking Assistance Needed: reports some assist with ADLs   Comments: granddaughter works but otherwise home with pt  Hand Dominance   Dominant Hand: Right    Extremity/Trunk Assessment   Upper Extremity Assessment Upper Extremity Assessment: Defer to OT evaluation    Lower Extremity Assessment Lower Extremity Assessment: Generalized weakness;RLE deficits/detail;LLE deficits/detail RLE Deficits / Details: active movement at ankle (DF/PF), limited active knee flexion/heel slide in bed. needs significant assist to use legs for bed mobility LLE Deficits / Details: active movement  at ankle (DF/PF), limited active knee flexion/heel slide in bed. needs significant assist to use legs for bed mobility    Cervical / Trunk Assessment Cervical / Trunk Assessment: Kyphotic;Other exceptions Cervical / Trunk Exceptions: large body habitus, L lean  Communication   Communication: No difficulties  Cognition Arousal/Alertness: Lethargic Behavior During Therapy: WFL for tasks assessed/performed Overall Cognitive Status: No family/caregiver present to determine baseline cognitive functioning Area of Impairment: Memory;Safety/judgement;Problem solving                     Memory: Decreased short-term memory   Safety/Judgement: Decreased awareness of safety   Problem Solving: Slow processing;Difficulty sequencing;Decreased initiation General Comments: requiring cues for sequence, reassurance, and initiation of transfers      General Comments General comments (skin integrity, edema, etc.): pt incontinent of loose stools in sitting, RN alerted.     Exercises General Exercises - Lower Extremity Ankle Circles/Pumps: AAROM;Both;10 reps;Supine Heel Slides: AAROM;Both;5 reps;Supine   Assessment/Plan    PT Assessment Patient needs continued PT services  PT Problem List Decreased strength;Decreased activity tolerance;Decreased balance;Decreased mobility;Decreased knowledge of use of DME;Decreased safety awareness;Decreased knowledge of precautions;Cardiopulmonary status limiting activity;Pain       PT Treatment Interventions DME instruction;Gait training;Functional mobility training;Therapeutic activities;Therapeutic exercise;Balance training;Neuromuscular re-education;Patient/family education    PT Goals (Current goals can be found in the Care Plan section)  Acute Rehab PT Goals Patient Stated Goal: reduce pain PT Goal Formulation: With patient/family Time For Goal Achievement: 06/08/20 Potential to Achieve Goals: Fair    Frequency Min 2X/week   Barriers to  discharge        Co-evaluation PT/OT/SLP Co-Evaluation/Treatment: Yes Reason for Co-Treatment: For patient/therapist safety;To address functional/ADL transfers PT goals addressed during session: Mobility/safety with mobility;Strengthening/ROM         AM-PAC PT "6 Clicks" Mobility  Outcome Measure Help needed turning from your back to your side while in a flat bed without using bedrails?: A Lot Help needed moving from lying on your back to sitting on the side of a flat bed without using bedrails?: A Lot Help needed moving to and from a bed to a chair (including a wheelchair)?: Total Help needed standing up from a chair using your arms (e.g., wheelchair or bedside chair)?: Total Help needed to walk in hospital room?: Total Help needed climbing 3-5 steps with a railing? : Total 6 Click Score: 8    End of Session   Activity Tolerance: Patient limited by fatigue Patient left: in bed;with call bell/phone within reach;with bed alarm set Nurse Communication: Mobility status (incontinence of bowel) PT Visit Diagnosis: Other abnormalities of gait and mobility (R26.89);Muscle weakness (generalized) (M62.81);Pain Pain - Right/Left: Left Pain - part of body: Arm    Time: 7867-6720 PT Time Calculation (min) (ACUTE ONLY): 34 min   Charges:   PT Evaluation $PT Eval Moderate Complexity: 1 Mod          Rolm Baptise, PT, DPT   Acute Rehabilitation Department Pager #: [phone]  Gaetana Michaelis 05/25/2020, 3:02 PM

## 2020-05-25 NOTE — Evaluation (Signed)
Clinical/Bedside Swallow Evaluation Patient Details  Name: [name] MRN: [id number] Date of Birth: [birth year]  Today's Date: 05/25/2020 Time: SLP Start Time (ACUTE ONLY): 1620 SLP Stop Time (ACUTE ONLY): 1636 SLP Time Calculation (min) (ACUTE ONLY): 16 min  Past Medical History:  Past Medical History:  Diagnosis Date  . Arthritis   . Asthma   . Chronic back pain   . Depression   . History of pulmonary embolus (PE)   . Hx-TIA (transient ischemic attack)   . Hyperlipemia   . Hypertension   . Personal history of DVT (deep vein thrombosis)   . Scoliosis   . Sleep apnea    cpap - settingsi at 2   . Stroke (HCC)   . 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 ARTHROSCOPIC REPAIR ACL  02/03/2004  . IR IVC FILTER PLMT / S&I /IMG GUID/MOD SED  05/10/2020  . LEFT HEART CATH AND CORONARY ANGIOGRAPHY N/A 09/16/2019   Procedure: LEFT HEART CATH AND CORONARY ANGIOGRAPHY;  Surgeon: Yvonne Kendall, MD;  Location: [facility] INVASIVE CV LAB;  Service: Cardiovascular;  Laterality: N/A;  . LOOP RECORDER INSERTION N/A 02/21/2020   Procedure: LOOP RECORDER INSERTION;  Surgeon: Duke Salvia, MD;  Location: [facility] INVASIVE CV LAB;  Service: Cardiovascular;  Laterality: N/A;  . OOPHORECTOMY  1970  . SPINE SURGERY  03/31/2006  . TOTAL KNEE ARTHROPLASTY Left 05/17/2016   Procedure: LEFT TOTAL KNEE ARTHROPLASTY;  Surgeon: Kathryne Hitch, MD;  Location: [facility] ORS;  Service: Orthopedics;  Laterality: Left;  Adductor Block; Spinal to General  . TUBAL LIGATION  1983   HPI:  Pt is a 79 y.o. female who presented from CIR due to AKI with increasing  lethargy and creatine levels despite IVF infusion. The pt's was reccently admissted 9/24 due to acute saddle embolism, development of LUE hematoma, and placement of IVC filter on 9/29. PMH includes: CVA in 02/2020, Takotsubo cardiomyopathy, DVT/PE, HTN, and obstructive sleep apnea. MRI was  negative for acute changes and CXR was revealed left base atelectasis but was otherwise clear. Pt was receiving inpatient rehab SLP services for cognition but not swallowing. BSE on 02/17/20 revealed normal swallow function. Renee, RN reported on 10/14 that the pt was demonstrating pocketing   Assessment / Plan / Recommendation Clinical Impression  Pt was seen for bedside swallow evaluation. She denied having symptoms of dysphagia at baseline, but stated that since she was admitted from CIR, she has been having difficulty with mastication and food getting "stuck" on the left side of her mouth. Pt was alert throughout the evaluation, but processing speed appeared slow. Oral mechanism exam was significant for left-sided facial and lingual weakness. She presented with mandibular incisors only and indicated that she does not wear dentures but typically consumed regular textures without difficulty. No s/sx of aspiration were noted with any solids or liquids. Mastication was moderate-severely prolonged and mild-moderate lingual residue was noted. Residue was cleared with a liquid wash. A dysphagia 1 (puree) diet with thin liquids is recommended at this time. SLP will follow for dysphagia treatment.  SLP Visit Diagnosis: Dysphagia, oral phase (R13.11)    Aspiration Risk  Mild aspiration risk    Diet Recommendation Dysphagia 1 (Puree);Thin liquid   Liquid Administration via: Cup;Straw Medication Administration: Whole meds with puree Supervision: Full supervision/cueing for compensatory strategies Compensations: Slow rate;Small sips/bites;Follow solids with liquid Postural Changes: Seated upright at 90 degrees;Remain upright for at least 30 minutes after  po intake    Other  Recommendations Oral Care Recommendations: Oral care BID   Follow up Recommendations Inpatient Rehab      Frequency and Duration min 2x/week  2 weeks       Prognosis Prognosis for Safe Diet Advancement: Good Barriers to Reach  Goals: Cognitive deficits      Swallow Study   General Date of Onset: 05/25/20 HPI: Pt is a 79 y.o. female who presented from CIR due to AKI with increasing  lethargy and creatine levels despite IVF infusion. The pt's was reccently admissted 9/24 due to acute saddle embolism, development of LUE hematoma, and placement of IVC filter on 9/29. PMH includes: CVA in 02/2020, Takotsubo cardiomyopathy, DVT/PE, HTN, and obstructive sleep apnea. MRI was negative for acute changes and CXR was revealed left base atelectasis but was otherwise clear. Pt was receiving inpatient rehab SLP services for cognition but not swallowing. BSE on 02/17/20 revealed normal swallow function. Renee, RN reported on 10/14 that the pt was demonstrating pocketing Type of Study: Bedside Swallow Evaluation Previous Swallow Assessment: See HPI Diet Prior to this Study: Regular;Thin liquids Temperature Spikes Noted: No Respiratory Status: Room air History of Recent Intubation: No Behavior/Cognition: Alert;Pleasant mood;Cooperative Oral Cavity Assessment: Within Functional Limits Oral Care Completed by SLP: No Oral Cavity - Dentition:  (mandibular incisors only) Vision: Functional for self-feeding Self-Feeding Abilities: Needs assist Patient Positioning: Upright in bed;Postural control adequate for testing Baseline Vocal Quality: Normal    Oral/Motor/Sensory Function Overall Oral Motor/Sensory Function: Mild impairment Facial ROM: Reduced left;Suspected CN VII (facial) dysfunction Facial Symmetry: Abnormal symmetry left;Suspected CN VII (facial) dysfunction Lingual Strength: Reduced Lingual Sensation: Within Functional Limits Velum: Within Functional Limits   Ice Chips Ice chips: Impaired Presentation: Spoon Oral Phase Functional Implications: Prolonged oral transit   Thin Liquid Thin Liquid: Within functional limits Presentation: Straw;Cup    Nectar Thick Nectar Thick Liquid: Not tested   Honey Thick Honey Thick Liquid:  Not tested   Puree Puree: Within functional limits Presentation: Spoon   Solid (dysphagia 2, 3, regular)     Solid: Impaired Presentation: Spoon Oral Phase Impairments: Impaired mastication Oral Phase Functional Implications: Oral residue;Impaired mastication     Cru Kritikos I. Vear Clock, MS, CCC-SLP Acute Rehabilitation Services Office number [phone] Pager [phone]  Scheryl Marten 05/25/2020,5:25 PM

## 2020-05-25 NOTE — Progress Notes (Signed)
Initial Nutrition Assessment  DOCUMENTATION CODES:   Severe malnutrition in context of acute illness/injury  INTERVENTION:  Monitor magnesium, potassium, and phosphorus daily for at least 3 days, MD to replete as needed, as pt is at risk for refeeding syndrome  Recommend SLP consult/evaluation given reports of pt pocketing food  Ensure Enlive po TID, each supplement provides 350 kcal and 20 grams of protein  Magic cup TID with meals, each supplement provides 290 kcal and 9 grams of protein  MVI daily  If aligned with pt's GOC, recommend initiation of enteral nutrition via NGT/Cortrak, consider: -Osmolite 1.5 cal @ 50ml/hr, advance 27ml/hr Q8H until goal rate of 39ml/hr ( ) is reached -71ml Prosource TF TID  Recommended TF regimen would provide 2100 kcals, 115 grams of protein, free water   NUTRITION DIAGNOSIS:   Severe Malnutrition related to acute illness (saddle PE) as evidenced by moderate fat depletion, moderate muscle depletion, edema, energy intake < or equal to 50% for > or equal to 5 days, severe muscle depletion, severe fat depletion.    GOAL:   Patient will meet greater than or equal to 90% of their needs    MONITOR:   PO intake, Supplement acceptance, Labs, Weight trends, I & O's  REASON FOR ASSESSMENT:   Consult Other (Comment) ("nutritional evaluation")  ASSESSMENT:   Pt initially admitted for syncope on 9/4 and evaluation subsequently revealed saddle PE with cor pulmonale, pt s/p tPA. Pt had LUE hematoma requiring transfusion. Pt also had R hip cellulitis and E. Coli UTI. IVC filter was placed 9/29 and pt discharged to CIR. Pt has since become increasingly lethargic and has had progressive renal failure. Pt transferred back to hospital for ARF. PMH includes CVA, Takotsubo cardiomyopathy, DVT/PE, HTN, and OSA.   RN in room at time of visit. Pt provided no responses to RD questions. Pt has had very poor PO intake, 0-5% completion of most meals  over the last week. Pt also noted to be pocketing food this morning. Recommend SLP evaluation. Also recommend considering initiation of nutrition support via NGT/Cortrak given pt's prolonged poor po intake. Will provide recommendations above.   Reviewed wt history; pt's wt noted to have been trending up since February.   UOP: x24 hours I/O: +478ml since admit  Labs: BUN 90 (H, trending down), Cr 2.5 (H, trending down) Medications: colace, miralax, senokot-s  NUTRITION - FOCUSED PHYSICAL EXAM:    Most Recent Value  Orbital Region Severe depletion  Upper Arm Region Mild depletion  Thoracic and Lumbar Region Moderate depletion  Buccal Region Severe depletion  Temple Region Severe depletion  Clavicle Bone Region Mild depletion  Clavicle and Acromion Bone Region Mild depletion  Scapular Bone Region Moderate depletion  Dorsal Hand Severe depletion  Patellar Region Mild depletion  Anterior Thigh Region Mild depletion  Posterior Calf Region Mild depletion  Edema (RD Assessment) Moderate  [BUE, BLE]  Hair Reviewed  Eyes Reviewed  Mouth Reviewed  Skin Reviewed  Nails Reviewed       Diet Order:   Diet Order            Diet renal with fluid restriction Fluid restriction: 1200 mL Fluid; Room service appropriate? Yes; Fluid consistency: Thin  Diet effective now                 EDUCATION NEEDS:   No education needs have been identified at this time  Skin:  Skin Assessment: Reviewed RN Assessment  Last BM:  10/13  Height:  Ht Readings from Last 1 Encounters:  05/25/20 5\' 2"  (1.575 m)    Weight:   Wt Readings from Last 1 Encounters:  05/25/20 93 kg    Ideal Body Weight:  50 kg  BMI:  Body mass index is 37.5 kg/m.  Estimated Nutritional Needs:   Kcal:  1900-2100  Protein:  110-120 grams  Fluid:  >1.9L/d    05/27/20, MS, RD, LDN RD pager number and weekend/on-call pager number located in Amion.

## 2020-05-25 NOTE — Progress Notes (Signed)
Admit: 05/24/2020 LOS: 1  [name] AKI in setting of poor oral intake, continuation of ACE inhibitor  Subjective:  . Now on 22M . Still lethargic and not very interactive . Pocketing breakfast . SCr down to 2.5!, K 4.6 . UOP 0.8L  10/13 0701 - 10/14 0700 In: 1391.3 [P.O.:120; I.V.:1271.3] Out: 800 [Urine:800]  Filed Weights   05/25/20 1000  Weight: 93 kg    Scheduled Meds: . artificial tears   Left Eye QHS  . aspirin EC  81 mg Oral Daily  . atorvastatin  20 mg Oral QHS  . busPIRone  30 mg Oral BID  . docusate sodium  100 mg Oral BID  . donepezil  10 mg Oral QHS  . linaclotide  145 mcg Oral Daily  . melatonin  3 mg Oral QHS  . memantine  28 mg Oral Daily  . polyethylene glycol  17 g Oral BID  . polyvinyl alcohol  2 drop Left Eye QID  . senna-docusate  1 tablet Oral QHS  . sodium chloride flush  3 mL Intravenous Q12H   Continuous Infusions: . heparin    . lactated ringers 75 mL/hr at 05/24/20 2148   PRN Meds:.acetaminophen **OR** acetaminophen, bisacodyl, calcium carbonate (dosed in mg elemental calcium), camphor-menthol **AND** hydrOXYzine, cycloSPORINE, docusate sodium, feeding supplement (NEPRO CARB STEADY), hydrALAZINE, morphine injection, ondansetron **OR** ondansetron (ZOFRAN) IV, oxyCODONE, polyethylene glycol, sorbitol, zolpidem  Current Labs: reviewed    Physical Exam:  Blood pressure (!) 121/58, pulse 60, temperature 98.8 F (37.1 C), resp. rate 18, height 5\' 2"  (1.575 m), weight 93 kg, SpO2 93 %. GEN: Elderly female, chronically ill-appearing, NAD, lethargic and somnolent, answers to voice ENT: NCAT, mild temporal wasting EYES: Sunken eyes, EOMI CV: Regular, normal S1-S2 PULM: Coarse breath sounds bilaterally ABD: Soft, nontender SKIN: No rashes, lesions, petechia, purpura EXT: trace edema  A 1. AKI, suspect is hypovolemia+ACEi.  Lisinopril has been held.  UA w/o proteinuria or hematuria. Initial UNa < 10.  Imaging w/o obstruction.  Cont LR @ 100/h for  another 24h given poor PO but probably have nearly corrected hypovolemic component. Las LVEF 65%.  2. Debility following submassive PE complicated by acute blood loss anemia and forearm hematoma, in CIR 3. Confusion/encephalopathy; 4. Hypertension, was on ACE inhibitor, held; BPs ok 5. FTT   P . Thankfully GFR is recovering, K is improved . Cont LR for another 24h . Daily weights, Daily Renal Panel, Strict I/Os, Avoid nephrotoxins (NSAIDs, judicious IV Contrast)    MD 05/25/2020, 10:49 AM  Recent Labs  Lab 05/23/20 0506 05/24/20 0639 05/25/20 0146  NA 137 139 141  K 4.7 5.6* 4.6  CL 103 105 107  CO2 24 21* 23  GLUCOSE 122* 78 90  BUN 86* 103* 90*  CREATININE 2.56* 3.30* 2.50*  CALCIUM 8.4* 8.4* 8.3*   Recent Labs  Lab 05/22/20 0541 05/23/20 0506 05/25/20 0146  WBC 14.3* 13.3* 10.2  NEUTROABS  --  10.7*  --   HGB 9.5* 9.2* 8.5*  HCT 31.1* 29.7* 28.2*  MCV 98.1 98.7 100.0  PLT 164 158 190

## 2020-05-26 LAB — CBC
HCT: 27.5 % — ABNORMAL LOW (ref 36.0–46.0)
Hemoglobin: 8.4 g/dL — ABNORMAL LOW (ref 12.0–15.0)
MCH: 29.8 pg (ref 26.0–34.0)
MCHC: 30.5 g/dL (ref 30.0–36.0)
MCV: 97.5 fL (ref 80.0–100.0)
Platelets: 227 10*3/uL (ref 150–400)
RBC: 2.82 MIL/uL — ABNORMAL LOW (ref 3.87–5.11)
RDW: 14.9 % (ref 11.5–15.5)
WBC: 9 10*3/uL (ref 4.0–10.5)
nRBC: 0 % (ref 0.0–0.2)

## 2020-05-26 LAB — BASIC METABOLIC PANEL
Anion gap: 10 (ref 5–15)
BUN: 67 mg/dL — ABNORMAL HIGH (ref 8–23)
CO2: 22 mmol/L (ref 22–32)
Calcium: 8.4 mg/dL — ABNORMAL LOW (ref 8.9–10.3)
Chloride: 110 mmol/L (ref 98–111)
Creatinine, Ser: 1.58 mg/dL — ABNORMAL HIGH (ref 0.44–1.00)
GFR, Estimated: 31 mL/min — ABNORMAL LOW (ref >60)
Glucose, Bld: 90 mg/dL (ref 70–99)
Potassium: 4.2 mmol/L (ref 3.5–5.1)
Sodium: 142 mmol/L (ref 135–145)

## 2020-05-26 LAB — HEPARIN LEVEL (UNFRACTIONATED)
Heparin Unfractionated: 0.32 IU/mL (ref 0.30–0.70)
Heparin Unfractionated: 0.36 IU/mL (ref 0.30–0.70)

## 2020-05-26 MED ORDER — SODIUM CHLORIDE 0.9 % IV SOLN
INTRAVENOUS | Status: DC | PRN
  Administered 2020-05-26: 250 mL via INTRAVENOUS

## 2020-05-26 NOTE — Progress Notes (Addendum)
ANTICOAGULATION CONSULT NOTE - Follow Up Consult  Pharmacy Consult for IV Heparin Indication: History of Saddle PE (05/05/2020)  Allergies  Allergen Reactions  . T-Pa [Alteplase] Swelling    Does okay with it if premedicated with benadryl.  . Codeine Itching    Patient Measurements: Height: 5\' 2"  (157.5 cm) (05/13/2020) Weight: 93 kg (205 lb 0.4 oz) IBW/kg (Calculated) : 50.1 Heparin Dosing Weight: 71.7 kg  Vital Signs: Temp: 97.9 F (36.6 C) (10/15 0828) Temp Source: Oral (10/15 0828) BP: 132/62 (10/15 0828) Pulse Rate: 62 (10/15 0828)  Labs: Recent Labs    05/24/20 0639 05/25/20 0146 05/25/20 0146 05/25/20 1952 05/26/20 0227 05/26/20 1017  HGB  --  8.5*   < > 8.9* 8.4*  --   HCT  --  28.2*  --  29.2* 27.5*  --   PLT  --  190  --  208 227  --   HEPARINUNFRC  --   --   --  0.30 0.32 0.36  CREATININE 3.30* 2.50*  --   --  1.58*  --    < > = values in this interval not displayed.    Estimated Creatinine Clearance: 30.7 mL/min (A) (by C-G formula based on SCr of 1.58 mg/dL (H)).   Medications:  Infusions:  . heparin 850 Units/hr (05/26/20 0313)    Assessment: 79 years of age female with recent diagnosis of saddle pulmonary embolus on 05/05/2020 who developed a right upper extremity hematoma on heparin during that admission and anticoagulation was discontinued and IVC filter placed. Plan was to re-challenge anticoagulation after 2 weeks. Patient has been re-admitted from rehab with lethargy, altered mental status, and renal failure. Pharmacy was consulted 10/14 to start IV heparin drip with no bolus for h/o saddle PE.   Confirmed with RN on 10/14 - no overt bleeding noted. Some bruising- noted on admission.  Heparin level is  therapeutic x 2 on 850 ut/hr IV heparin.  H/H is low but stable - no acute decrease. Platelets are stable and within normal limits. No bleeding reported confirmed with RN 10/15.    Goal of Therapy:  Heparin level 0.3-0.7 units/ml Monitor  platelets by anticoagulation protocol: Yes   Plan:  Continue Heparin to 850 units/hr.  Daily HL, CBC  Thank you for allowing pharmacy to be part of this patients care team.  11/15, RPh Clinical Pharmacist [phone] Please refer to AMION for [facility] Pharmacy numbers 05/26/2020,12:42 PM

## 2020-05-26 NOTE — TOC Initial Note (Signed)
Transition of Care [facility]) - Initial/Assessment Note    Patient Details  Name: [name] MRN: [id number] Date of Birth: 01/19/1941  Transition of Care [facility]) CM/SW Contact:    Sarah Goldmann, LCSW Phone Number: 05/26/2020, 2:44 PM  Clinical Narrative:  CSW visited room to talk with patient about her discharge disposition. [name] was lying in bed and her eyes were closed, however she opened her eyes and responded to CSW when her name was called. CSW identified self and stated purpose of visit. When asked, [name] responded that she and her granddaughter lives together. She reported that she has not been to a skilled nursing facility for ST rehab before when asked, ( MUST shows 2 admissions to [facility]; will ask son). Explained ST rehab and the facility search process and that she will be provided with a SNF list. When asked about calling a relative, [name] gave CSW permission to call [name].   Called son (2:25 pm) and message left.                  Expected Discharge Plan: Skilled Nursing Facility Barriers to Discharge: Continued Medical Work up   Patient Goals and CMS Choice Patient states their goals for this hospitalization and ongoing recovery are:: Patient agreeable with ST rehab CMS Medicare.gov Compare Post Acute Care list provided to:: Patient Choice offered to / list presented to : Patient  Expected Discharge Plan and Services Expected Discharge Plan: Skilled Nursing Facility In-house Referral: Clinical Social Work   Post Acute Care Choice: Skilled Nursing Facility Living arrangements for the past 2 months: Single Family Home                                     Prior Living Arrangements/Services Living arrangements for the past 2 months: Single Family Home Lives with:: Relatives (Patient and granddaughter [name] lives together) Patient language and need for interpreter reviewed:: No        Need for Family  Participation in Patient Care: Yes (Comment) Care giver support system in place?: Yes (comment)   Criminal Activity/Legal Involvement Pertinent to Current Situation/Hospitalization: No - Comment as needed  Activities of Daily Living Home Assistive Devices/Equipment: Dan Humphreys (specify type) ADL Screening (condition at time of admission) Patient's cognitive ability adequate to safely complete daily activities?: No Is the patient deaf or have difficulty hearing?: No Does the patient have difficulty seeing, even when wearing glasses/contacts?: No Does the patient have difficulty concentrating, remembering, or making decisions?: Yes Patient able to express need for assistance with ADLs?: Yes Does the patient have difficulty dressing or bathing?: No Independently performs ADLs?: No Communication: Independent Dressing (OT): Needs assistance Is this a change from baseline?: Pre-admission baseline Grooming: Needs assistance Is this a change from baseline?: Pre-admission baseline Feeding: Needs assistance Is this a change from baseline?: Pre-admission baseline Bathing: Needs assistance Is this a change from baseline?: Pre-admission baseline Toileting: Needs assistance Is this a change from baseline?: Pre-admission baseline In/Out Bed: Needs assistance Is this a change from baseline?: Pre-admission baseline Walks in Home: Needs assistance Is this a change from baseline?: Pre-admission baseline Does the patient have difficulty walking or climbing stairs?: Yes Weakness of Legs: Both Weakness of Arms/Hands: Left  Permission Sought/Granted Permission sought to share information with : Family Supports Permission granted to share information with : Yes, Verbal Permission Granted  Share Information with NAME:  [name]     Permission granted to share info w Relationship: Son  Permission granted to share info w Contact Information: [phone]  Emotional Assessment Appearance:: Appears  stated age Attitude/Demeanor/Rapport: Engaged (Patient did not initiate any comments, but did answer CSW's questions) Affect (typically observed): Appropriate Orientation: : Oriented to Self, Oriented to Place, Oriented to  Time, Oriented to Situation Alcohol / Substance Use: Tobacco Use, Alcohol Use, Illicit Drugs (Per H&P, patient quit smoking and does not drink alcohol or use illicit drugs) Psych Involvement: No (comment)  Admission diagnosis:  Acute renal failure (ARF) (HCC) [N17.9] Patient Active Problem List   Diagnosis Date Noted  . Protein-calorie malnutrition, severe 05/25/2020  . Acute renal failure (ARF) (HCC) 05/24/2020  . 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 Hyperkalemia   . Edema of left upper extremity   . Hematoma   . Prolonged Q-T interval on ECG   . Acute blood loss anemia   . Transaminitis   . Chronic bilateral low back pain without sciatica   . Debility 05/13/2020  . Cellulitis of right hip 05/10/2020  . Elevated troponin 05/05/2020  . Near syncope 05/05/2020  . Pulmonary embolism (HCC) 05/05/2020  . Macrocytic anemia   . Hypokalemia   . Labile blood pressure   . Drug-induced hypotension   . History of hypertension   . First degree AV block   . Sleep disturbance   . Drug induced constipation   . Stage 2 chronic kidney disease   . Dysphagia, post-stroke   . Benign essential HTN   . Slow transit constipation   . 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 Allergic reaction caused by a drug (tPA w/ lip swelling) 02/21/2020  . 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 Dementia (HCC) 02/21/2020  . Bradycardia 02/21/2020  . AKI (acute kidney injury) (HCC) 02/21/2020  . 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 Dyslipidemia   . History of DVT (deep vein thrombosis)   . OSA (obstructive sleep apnea)   . Leukocytosis   . Stroke [facility]) L>R pontine and L cerebellar embolic  infarcts, source unknown s/p tPA 02/16/2020  . Hx-TIA (transient ischemic attack)   . Hyperlipemia   . Hypertension   . Sleep apnea   . History of pulmonary embolus (PE)   . NSTEMI (non-ST elevated myocardial infarction) (HCC) 09/16/2019  . 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 Depression 07/29/2018  . Acute massive pulmonary embolism (HCC) 09/29/2016  . Acute encephalopathy 09/28/2016  . Left knee pain 09/28/2016  . Left leg swelling 09/28/2016  . Hilar mass 09/28/2016  . OSA on CPAP 09/28/2016  . HTN (hypertension) 09/28/2016  . Chronic pain 09/28/2016  . Cellulitis of left leg 06/14/2016  . Osteoarthritis of left knee 05/17/2016  . 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   PCP:  [name], MD Pharmacy:   [facility] [phone] - [place], [place] - [phone] AT [facility] [address] & [address] [place] [address] Phone: [phone] Fax: [phone]     Social Determinants of Health (SDOH) Interventions  No SDOH interventions requested or needed at this time.  Readmission Risk Interventions No flowsheet data found.

## 2020-05-26 NOTE — Consult Note (Signed)
Consultation Note Date: 05/26/2020   Patient Name: [name]  DOB: May 23, 1941  MRN: [id number]  Age / Sex: 79 y.o., female  PCP: Nolene Ebbs, MD Referring Physician: Karie Kirks, DO  Reason for Consultation: Establishing goals of care  HPI/Patient Profile: 79 y.o. female  with past medical history of CVA (02/2020), Takotsubo cardiomyopathy, NSTEMI, DVT/PE, HTN, and OSA. She was admitted 9/51fr syncopal episode. Evaluation revealed saddle PE with cor pulmonale and she was treated with TPA. She then developed LUE hematoma at the infusion site requiring blood transfusion, and anticoagulation was subsequently discontinued. Hospitalization was complicated by right hip cellulitis and E coli UTI. IVC filter placed 9/29. [facility] While in rehab, she became increasingly lethargic with limited participation in therapy.   She was admitted to 18M from CIR on 05/24/2020 with worsening renal function, with creatinine 3.3.  Creatinine has decreased to 1.58 as of today 10/15  Primary decision maker: Next of kin, son RSharen [phone] Clinical Assessment and Goals of Care: I have reviewed medical records including EPIC notes, labs and imaging, examined the patient and met in the 18M consult room with son RLovey Newcomerand daughter-in-law MLeda Gauze(by phone) to discuss diagnosis, prognosis, [place] EOL wishes, disposition, and options.  I introduced Palliative Medicine as specialized medical care for people living with serious illness. It focuses on providing relief from the symptoms and stress of a serious illness.   We discussed a brief life review of the patient. 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 [place]; has had chronic pain from this injury. Her husband passed away 6 years ago; she has lived with RLovey Newcomerand MHillsidesince then.   As far as functional and nutritional status, [name] reports a decline over the past few months. She has not been ambulatory since the stroke in July. She has very limited mobility. She is total care for ADLs. Oral intake has been decreased; albumin is 2.6 indicating a degree of malnutrition.      We discussed her current illness and what it means in the larger context of her ongoing co-morbidities.  Natural disease trajectory of stroke and chronic debility was discussed.  I attempted to elicit values and goals of care important to the patient. [name] states his main goal is to keep his mother at home. He states he will never place her in a facility.    The difference between aggressive medical intervention and comfort care was considered in light of the patient's goals of care. [name] and MLeda Gauzeexpress realistic expectations regarding her medical condition, functional status, and overall prognosis.   Advanced directives, concepts specific to code status, artifical feeding and hydration, and rehospitalization were considered and discussed. [name] does not want intubation or "life support" of any kind. He does want  CPR attempted. I explained that CPR would cause pain and trauma and but would not change the outcome. [name] verbalizes understanding  I introduced the concept of a comfort path to [name] and encouraged him to think about at what point he would want to focus on helping the patient feel as good as she can for as long as she can with the goal of comfort and dignity rather than prolonging life. [name] expresses he is aware his mother's time is limited, and he is ready to focus on her quality of life and transition to a comfort path.   Reviewed hospice philosophy and information on home vs residential hospice services - answered all  questions. [name] agrees with home hospice.   Questions and concerns were addressed.  The family was encouraged to call with questions or concerns.    SUMMARY OF RECOMMENDATIONS   - referral to home hospice - limited code (CPR, no intubation) with need for ongoing discussions - continue current plan of care for now, with plan to discharge home when medically stable  Code Status/Advance Care Planning:  Limited code  Symptom Management:   Oxycodone IR 15 mg 4 times daily prn  Palliative Prophylaxis:   Aspiration, Frequent Pain Assessment, Oral Care and Turn Reposition  Additional Recommendations (Limitations, Scope, Preferences):  No intubation   Psycho-social/Spiritual:   Created space and opportunity for patient and family to express thoughts and feelings regarding patient's current medical situation.   Emotional support provided  Prognosis:   < 6 months  Discharge Planning: Home with Hospice      Primary Diagnoses: Present on Admission: . Acute renal failure (ARF) ([place]) . HTN (hypertension) . (Resolved) HLD (hyperlipidemia) . Acute massive pulmonary embolism ([place]) . Takotsubo cardiomyopathy . Dyslipidemia . Dementia ([place])   I have reviewed the medical record, interviewed the patient and family, and examined the patient. The following aspects are pertinent.  Past Medical History:  Diagnosis Date  . Arthritis   . Asthma   . Chronic back pain   . Depression   . History of pulmonary embolus (PE)   . Hx-TIA (transient ischemic attack)   . Hyperlipemia   . Hypertension   . Personal history of DVT (deep vein thrombosis)   . Scoliosis   . Sleep apnea    cpap - settingsi at 3   . Stroke ([place])   . 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    Family History  Problem Relation Age of Onset  . Other Father        kidney problems   Scheduled Meds: . artificial tears   Left Eye QHS  . aspirin EC  81 mg Oral Daily  . atorvastatin  20 mg Oral QHS  . busPIRone   30 mg Oral BID  . docusate sodium  100 mg Oral BID  . donepezil  10 mg Oral QHS  . feeding supplement  237 mL Oral TID BM  . linaclotide  145 mcg Oral Daily  . melatonin  3 mg Oral QHS  . memantine  28 mg Oral Daily  . multivitamin with minerals  1 tablet Oral Daily  . polyethylene glycol  17 g Oral BID  . polyvinyl alcohol  2 drop Left Eye QID  . senna-docusate  1 tablet Oral QHS  . sodium chloride flush  3 mL Intravenous Q12H   Continuous Infusions: . heparin 850 Units/hr (05/26/20 1622)   PRN Meds:.acetaminophen **OR** acetaminophen, bisacodyl, calcium carbonate (dosed in mg elemental calcium), camphor-menthol **AND** hydrOXYzine, cycloSPORINE, docusate sodium,  feeding supplement (NEPRO CARB STEADY), hydrALAZINE, morphine injection, ondansetron **OR** ondansetron (ZOFRAN) IV, oxyCODONE, polyethylene glycol, sorbitol, zolpidem   Physical Exam Vitals reviewed.  Constitutional:      Appearance: She is not ill-appearing.     Comments: Chronically ill-appearing  Pulmonary:     Effort: Pulmonary effort is normal.  Neurological:     Mental Status: She is alert and oriented to person, place, and time.     Vital Signs: BP (!) 123/48 (BP Location: Right Arm)   Pulse 66   Temp 99.3 F (37.4 C) (Oral)   Resp 18   Ht _0  (1.575 m) Comment: 05/13/2020  Wt 93 kg   SpO2 94%   BMI 37.50 kg/m  Pain Scale: 0-10   Pain Score: 0-No pain   SpO2: SpO2: 94 % O2 Device:SpO2: 94 % O2 Flow Rate: .   IO: Intake/output summary:   Intake/Output Summary (Last 24 hours) at 05/26/2020 1833 Last data filed at 05/26/2020 1700 Gross per 24 hour  Intake 1428.43 ml  Output 1450 ml  Net -21.57 ml    LBM: Last BM Date: 05/24/20 Baseline Weight: Weight: 93 kg (05/13/2020) Most recent weight: Weight: 93 kg      Palliative Assessment/Data: PPS 30%    Time In: 17:30 Time Out: 18:41 Time Total: 71 minutes Greater than 50%  of this time was spent counseling and coordinating care related  to the above assessment and plan.  Signed by: Lavena Bullion, NP   Please contact Palliative Medicine Team phone at [phone] for questions and concerns.  For individual provider: See Shea Evans

## 2020-05-26 NOTE — Progress Notes (Signed)
PROGRESS NOTE  MAIJA BIGGERS DJS:[id number] DOB: Oct 07, 1940 DOA: 05/24/2020 PCP: [name], MD  Brief History   [name] is a 79 y.o. 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  Evaluation subsequently revealed saddle PE with cor pulmonale and she was treated with tPA. She had LUE hematoma requiring transfusion, and anticoagulation was subsequently discontinued.  She had R hip cellulitis treated with Vanc and E coli UTI treated with Keflex while hospitalized.  IVC filter was placed 9/29.  While in rehab, she has become increasingly lethargic and is no longer actively participating in therapy.  She has had progressive renal failure despite IVF infusion.  As such, CIR requested transfer back to the acute hospital.  At the time of my evaluation, the patient had just arrived from CIR to 69M.  She was pleasant and appeared to attempt to smile once but was overall very stoic.  She answered very limited questions.  She appears to deny pain.  Her DIL reports that she had passed out initially and they thinking it was a vagal response - but it was large DVT/PE.  She was doing fine until her left arm developed the hematoma and this caused terrible pain and she has been going downhill since then.  Following the CVA, her endurance wasn't that good, could only walk short distances.  The left eye issue is chronic and she wore a patch at night and has been putting medications in her eye.    Prior to rehab the patient was admitted for acute saddle embolism and treated with tPA.  She developed L arm hematoma and had IVC filter placed on 9/29.  She was admitted to rehab and was participating but has been increasingly weak and withdrawn and so unable to participate in therapy.  The patient has been found to have AKI with an increase in creatinine from baseline of 1.0 to 2.69. Likely due to ATN from vancomycin  and possibly poor PO intake. Nephrology has been consulted.   The patient has been evaluated by PT/OT. They have recommended SNF placement for the patient.  Consultants   Nephrology  Procedures   None  Antibiotics   Anti-infectives (From admission, onward)   None     Subjective  The patient is moribund and lethargic as she lies in bed. She tells me that she is tired.  Objective   Vitals:  Vitals:   05/26/20 0828 05/26/20 1644  BP: 132/62 (!) 123/48  Pulse: 62 66  Resp: 16 18  Temp: 97.9 F (36.6 C) 99.3 F (37.4 C)  SpO2: 95% 94%   Exam:  Constitutional:   The patient is awake, alert, and oriented x 3. No acute distress. Respiratory:   No increased work of breathing.  No wheezes, rales, or rhonchi  No tactile fremitus Cardiovascular:   Regular rate and rhythm  No murmurs, ectopy, or gallups.  No lateral PMI. No thrills. Abdomen:   Abdomen is soft, non-tender, non-distended  No hernias, masses, or organomegaly  Normoactive bowel sounds.  Musculoskeletal:   No cyanosis, clubbing, or edema Skin:   No rashes, lesions, ulcers  palpation of skin: no induration or nodules Neurologic:  She is moving all extremities. Responds verbally, but slowly. Pt is lethargic and confused. I have personally reviewed the following:   Today's Data   Vitals, BMP, CBC, Glucoses  Scheduled Meds:  artificial tears   Left Eye QHS  aspirin EC  81 mg Oral Daily   atorvastatin  20 mg Oral QHS   busPIRone  30 mg Oral BID   docusate sodium  100 mg Oral BID   donepezil  10 mg Oral QHS   feeding supplement  237 mL Oral TID BM   linaclotide  145 mcg Oral Daily   melatonin  3 mg Oral QHS   memantine  28 mg Oral Daily   multivitamin with minerals  1 tablet Oral Daily   polyethylene glycol  17 g Oral BID   polyvinyl alcohol  2 drop Left Eye QID   senna-docusate  1 tablet Oral QHS   sodium chloride flush  3 mL Intravenous Q12H   Continuous  Infusions:  heparin 850 Units/hr (05/26/20 1622)    Principal Problem:   Acute renal failure (ARF) (HCC) Active Problems:   OSA on CPAP   HTN (hypertension)   Acute massive pulmonary embolism (HCC)   Takotsubo cardiomyopathy   Dyslipidemia   Dementia (HCC)   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 Creatinine is 1.58 today in contrast to 3.3 on admission. Likely due to combination of poor PO intake and ATN from vancomycin. Nephrology has been consulted. There is some consideration of HD, although she is currently a very poor long-term candidate for HD and likely also short-term. Palliative care consultation requested. Hold Lisinopril and other nephrotoxic medications. Avoid hypotension. Monitor creatinine, electrolytes, and volume status.   Neurologic deficits with h/o CVA: Previously on Plavix but this was held due to her arm hematoma. She has notable left sided facial weakness, but I understand that this has been present since her arrival at rehab. MRI has been performed and is negative for acute intracranial abnormality. 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 She continues to receive ASA 81 mg with 20 mg of lipitor daily.  Hypertension: The patient is normotensive without antihypertensive medications. Monitor.  Recent large bilateral acute pulmonary embolism with saddle emboli/at least submassive PE with cor pulmonale: The patient is status post TPA. She was initially on heparin drip. This therapy was complicated by left upper extremity hematoma/bleeding therefore heparin drip discontinued. She had an IVC filter placed on 05/01/2020. The plan had been to rechallenge with AC 2 weeks from this date.  Phaarmacy has been consulted to place the patient back on a heparin drip. Echocardiogram from 05/06/2020 shows EF of 60% with some right heart heart strain and tricuspid regurgitation.Repeat echocardiogram outpatient in few weeks.  Left upper extremity hematoma: Pt is to keep her arm elevated. She is receiving pain medication  (Oxycodone, fentanyl) and a bowel regimen has been prescribed. Current UE findings could be associated with hematoma rather than CVA.  Acute blood loss anemia: CT abdomen pelvis was previously negative for retroperitoneal bleed.After transfusion hemoglobin is slowly down-trending. Continue to monitor hemoglobin and check an anemia panel. Hemoglobin is stable at 8.9.  History of  E. Coli UTI and R hip cellulitis: She has completed a course of keflex for UTI and vancomycin for cellulitis.   Takotsubo cardiomyopathy: Noted. Patient with apparent Takotsubo's based on severely depressed EF on echo 09/16/19 but cath with no significant CAD. As of 9/25, EF has rebounded to EF 60-65% and she  has grade 1 diastolic dysfunction No current evidence of decompensated CHF as cause for current presentation.  Dementia/chronic anxiety: Continue home meds - Aricept, Namenda, BuSpar, melatonin. Palliative care consulted.  I have seen and examined this patient myself. I have spent 33 minutes in her evaluation and care.  DVT prophylaxis: SCDs Code Status: Limited - DNI - confirmed with family Family Communication: None present. Disposition Plan:The patient is from: home             Anticipated d/c is to: SNF             Anticipated d/c date will depend on clinical response to treatment, likely days to weeks             Patient is currently: acutely ill   Shiquan Mathieu, DO [facility] Direct contact: see [email or web address]  7PM-7AM contact night coverage as above 05/26/2020, 5:05 PM  LOS: 1 day

## 2020-05-26 NOTE — Progress Notes (Signed)
ANTICOAGULATION CONSULT NOTE - Follow Up Consult  Pharmacy Consult for heparin Indication: h/o PE  Labs: Recent Labs    05/23/20 0506 05/24/20 0639 05/25/20 0146 05/25/20 0146 05/25/20 1952 05/26/20 0227  HGB   < >  --  8.5*   < > 8.9* 8.4*  HCT   < >  --  28.2*  --  29.2* 27.5*  PLT   < >  --  190  --  208 227  HEPARINUNFRC  --   --   --   --  0.30 0.32  CREATININE  --  3.30* 2.50*  --   --  1.58*   < > = values in this interval not displayed.    Assessment/Plan:  79yo female remains therapeutic on heparin after rate change. Will continue gtt at current rate and confirm stable with additional level.   Vernard Gambles, PharmD, BCPS  05/26/2020,3:50 AM

## 2020-05-26 NOTE — [place] FL2 (Signed)
[place] MEDICA[place]2 LEVEL OF CARE SCREENING TOOL     IDENTIFICATION  Patient Name: [name] Birthdate: 1941/07/27 Sex: female Admission Date (Current Location): 05/24/2020  [facility] and [place] Number:  Producer, television/film/video and Address:  [facility] [place], [address] [place], [place] [address]      Provider Number: [id number]  Attending Physician Name and Address:  Fran Lowes, DO  Relative Name and Phone Number:  Malachi Pro - son; [phone]    Current Level of Care: Hospital Recommended Level of Care: Skilled Nursing Facility Prior Approval Number:    Date Approved/Denied:   PASRR Number: [id number] (Eff. 05/18/16)  Discharge Plan: SNF    Current Diagnoses: Patient Active Problem List   Diagnosis Date Noted  . Protein-calorie malnutrition, severe 05/25/2020  . Acute renal failure (ARF) (HCC) 05/24/2020  . 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 Hyperkalemia   . Edema of left upper extremity   . Hematoma   . Prolonged Q-T interval on ECG   . Acute blood loss anemia   . Transaminitis   . Chronic bilateral low back pain without sciatica   . Debility 05/13/2020  . Cellulitis of right hip 05/10/2020  . Elevated troponin 05/05/2020  . Near syncope 05/05/2020  . Pulmonary embolism (HCC) 05/05/2020  . Macrocytic anemia   . Hypokalemia   . Labile blood pressure   . Drug-induced hypotension   . History of hypertension   . First degree AV block   . Sleep disturbance   . Drug induced constipation   . Stage 2 chronic kidney disease   . Dysphagia, post-stroke   . Benign essential HTN   . Slow transit constipation   . 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 Allergic reaction caused by a drug (tPA w/ lip swelling) 02/21/2020  . 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 Dementia (HCC) 02/21/2020  . Bradycardia 02/21/2020  . AKI (acute kidney injury)  (HCC) 02/21/2020  . 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 Dyslipidemia   . History of DVT (deep vein thrombosis)   . OSA (obstructive sleep apnea)   . Leukocytosis   . Stroke [facility]) L>R pontine and L cerebellar embolic infarcts, source unknown s/p tPA 02/16/2020  . Hx-TIA (transient ischemic attack)   . Hyperlipemia   . Hypertension   . Sleep apnea   . History of pulmonary embolus (PE)   . NSTEMI (non-ST elevated myocardial infarction) (HCC) 09/16/2019  . 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 Depression 07/29/2018  . Acute massive pulmonary embolism (HCC) 09/29/2016  . Acute encephalopathy 09/28/2016  . Left knee pain 09/28/2016  . Left leg swelling 09/28/2016  . Hilar mass 09/28/2016  . OSA on CPAP 09/28/2016  . HTN (hypertension) 09/28/2016  . Chronic pain 09/28/2016  . Cellulitis of left leg 06/14/2016  . Osteoarthritis of left knee 05/17/2016  . 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    Orientation RESPIRATION BLADDER Height & Weight     Self, Time, Situation, Place  Normal Incontinent, External catheter (Catheter placed 05/24/20) Weight: 205 lb 0.4 oz (93 kg) Height:  5\' 2"  (157.5 cm) (05/13/2020)  BEHAVIORAL SYMPTOMS/MOOD NEUROLOGICAL BOWEL NUTRITION STATUS      Incontinent Diet (DYS 2, thin liquid)  AMBULATORY STATUS COMMUNICATION OF NEEDS Skin   Total Care (Patient was unable to ambulate with PT during evaluation on 05/25/20) Verbally Other (Comment) (Ecchymosis right/left abdomen; Blister  mid sacrum with foam dressing)                       Personal Care Assistance Level of Assistance  Bathing, Feeding, Dressing Bathing Assistance: Maximum assistance Feeding assistance: Limited assistance (Assistance with set-up) Dressing Assistance: Maximum assistance     Functional Limitations Info  Sight, Hearing, Speech Sight Info: Impaired (Per patient, wears glasses for reading) Hearing Info: Adequate Speech Info: Adequate     SPECIAL CARE FACTORS FREQUENCY  PT (By licensed PT), OT (By licensed OT), Speech therapy     PT Frequency: Evaluated 10/14; PT at SNF eval and treat, a minimum of 5 days per week OT Frequency: Evaluated 10/14. OT at SNF eval and treat, a minimum of 5 days per week     Speech Therapy Frequency: Evaluated 10/14. DYS 2 recommended (fine chop, thin liquid)      Contractures Contractures Info: Not present    Additional Factors Info  Code Status, Allergies Code Status Info: Partial Allergies Info: T-pa Alteplase, Codeine           Current Medications (05/26/2020):  This is the current hospital active medication list Current Facility-Administered Medications  Medication Dose Route Frequency Provider Last Rate Last Admin  . acetaminophen (TYLENOL) tablet 650 mg  650 mg Oral Q6H PRN Jonah Blue, MD       Or  . acetaminophen (TYLENOL) suppository 650 mg  650 mg Rectal Q6H PRN Jonah Blue, MD      . artificial tears (LACRILUBE) ophthalmic ointment   Left Eye Noemi Chapel, MD   1 application at 05/25/20 2112  . aspirin EC tablet 81 mg  81 mg Oral Daily Jonah Blue, MD   81 mg at 05/26/20 1048  . atorvastatin (LIPITOR) tablet 20 mg  20 mg Oral Noemi Chapel, MD   20 mg at 05/25/20 2112  . bisacodyl (DULCOLAX) EC tablet 5 mg  5 mg Oral Daily PRN Swayze, Ava, DO      . busPIRone (BUSPAR) tablet 30 mg  30 mg Oral BID Jonah Blue, MD   30 mg at 05/26/20 1049  . calcium carbonate (dosed in mg elemental calcium) suspension 500 mg of elemental calcium  500 mg of elemental calcium Oral Q6H PRN Jonah Blue, MD      . camphor-menthol [facility]) lotion 1 application  1 application Topical Q8H PRN Jonah Blue, MD       And  . hydrOXYzine (ATARAX/VISTARIL) tablet 25 mg  25 mg Oral Q8H PRN Jonah Blue, MD      . cycloSPORINE (RESTASIS) 0.05 % ophthalmic emulsion 1 drop  1 drop Both Eyes Daily PRN Jonah Blue, MD      . docusate sodium (COLACE) capsule 100 mg   100 mg Oral BID Jonah Blue, MD   100 mg at 05/26/20 1049  . docusate sodium (ENEMEEZ) enema 283 mg  1 enema Rectal PRN Jonah Blue, MD      . donepezil (ARICEPT) tablet 10 mg  10 mg Oral Noemi Chapel, MD   10 mg at 05/25/20 2112  . feeding supplement (ENSURE ENLIVE / ENSURE PLUS) liquid 237 mL  237 mL Oral TID BM Jonah Blue, MD   237 mL at 05/26/20 1059  . feeding supplement (NEPRO CARB STEADY) liquid 237 mL  237 mL Oral TID PRN Jonah Blue, MD      . heparin ADULT infusion 100 units/mL (25000 units/258mL sodium chloride 0.45%)  850 Units/hr Intravenous Continuous Amend,  Emiliano Dyer, RPH 8.5 mL/hr at 05/26/20 0313 850 Units/hr at 05/26/20 0313  . hydrALAZINE (APRESOLINE) injection 5 mg  5 mg Intravenous Q4H PRN Jonah Blue, MD      . linaclotide Karlene Einstein) capsule 145 mcg  145 mcg Oral Daily Jonah Blue, MD   145 mcg at 05/26/20 1051  . melatonin tablet 3 mg  3 mg Oral QHS Jonah Blue, MD   3 mg at 05/25/20 2111  . memantine (NAMENDA XR) 24 hr capsule 28 mg  28 mg Oral Daily Jonah Blue, MD   28 mg at 05/26/20 1051  . morphine 2 MG/ML injection 2 mg  2 mg Intravenous Q2H PRN Jonah Blue, MD   2 mg at 05/24/20 1836  . multivitamin with minerals tablet 1 tablet  1 tablet Oral Daily Jonah Blue, MD   1 tablet at 05/26/20 1048  . ondansetron (ZOFRAN) tablet 4 mg  4 mg Oral Q6H PRN Jonah Blue, MD       Or  . ondansetron [facility]) injection 4 mg  4 mg Intravenous Q6H PRN Jonah Blue, MD      . oxyCODONE (Oxy IR/ROXICODONE) immediate release tablet 15 mg  15 mg Oral QID PRN Jonah Blue, MD      . polyethylene glycol (MIRALAX / GLYCOLAX) packet 17 g  17 g Oral BID Jonah Blue, MD   17 g at 05/26/20 1048  . polyethylene glycol (MIRALAX / GLYCOLAX) packet 17 g  17 g Oral Daily PRN Jonah Blue, MD      . polyvinyl alcohol (LIQUIFILM TEARS) 1.4 % ophthalmic solution 2 drop  2 drop Left Eye QID Jonah Blue, MD   2 drop at 05/26/20 1059  .  senna-docusate (Senokot-S) tablet 1 tablet  1 tablet Oral Noemi Chapel, MD   1 tablet at 05/25/20 2111  . sodium chloride flush (NS) 0.9 % injection 3 mL  3 mL Intravenous Q12H Jonah Blue, MD   3 mL at 05/26/20 1052  . sorbitol 70 % solution 30 mL  30 mL Oral PRN Jonah Blue, MD      . zolpidem Remus Loffler) tablet 5 mg  5 mg Oral QHS PRN Jonah Blue, MD         Discharge Medications: Please see discharge summary for a list of discharge medications.  Relevant Imaging Results:  Relevant Lab Results:   Additional Information ss #[id number]. Patient weight 205 pounds. Patient reported that she has had both COVID vaccinations  Okey Dupre, Lazaro Arms, LCSW

## 2020-05-26 NOTE — Progress Notes (Signed)
  Speech Language Pathology Treatment: Dysphagia  Patient Details Name: [name] MRN: [id number] DOB: January 16, 1941 Today's Date: 05/26/2020 Time: 9371-6967 SLP Time Calculation (min) (ACUTE ONLY): 15 min  Assessment / Plan / Recommendation Clinical Impression  Pt was awake, alert, and participatory for today's therapy session.  Pt denied any difficulty tolerating pureed, thin liquids diet since initial evaluation.  Pt consumed teaspoons of applesauce and straw sips of water with timely oral phase, no oral residue, and no overt s/s of aspiration.  Pt was willing to try regular textures to continue working towards diet advancement.  Her mastication of a graham cracker was slightly prolonged but she was able to clear solids from the oral cavity with increased time.  In light of acute deconditioning, I would suggest slow diet advancement to dys 2 textures and thin liquids with close monitoring over the weekend.  Discussed with nursing that pt may be downgraded back to pureed textures by nursing team over the weekend should she exhibit increased pocketing or coughing and/or decreased PO intake of advanced solids.  SLP will follow up to monitor toleration of upgrade and assess readiness for further advancement next week.  Pt was left in bed with bed alarm set and call bell within reach.     HPI HPI: Pt is a 79 y.o. female who presented from CIR due to AKI with increasing  lethargy and creatine levels despite IVF infusion. The pt's was reccently admissted 9/24 due to acute saddle embolism, development of LUE hematoma, and placement of IVC filter on 9/29. PMH includes: CVA in 02/2020, Takotsubo cardiomyopathy, DVT/PE, HTN, and obstructive sleep apnea. MRI was negative for acute changes and CXR was revealed left base atelectasis but was otherwise clear. Pt was receiving inpatient rehab SLP services for cognition but not swallowing. BSE on 02/17/20 revealed normal swallow function. Renee, RN reported on 10/14  that the pt was demonstrating pocketing      SLP Plan  Continue with current plan of care       Recommendations  Diet recommendations: Dysphagia 2 (fine chop);Thin liquid Liquids provided via: Cup;Straw Medication Administration: Whole meds with puree Supervision: Patient able to self feed;Full supervision/cueing for compensatory strategies Compensations: Slow rate;Small sips/bites;Follow solids with liquid Postural Changes and/or Swallow Maneuvers: Out of bed for meals;Seated upright 90 degrees;Upright 30-60 min after meal                Oral Care Recommendations: Oral care BID Follow up Recommendations: Inpatient Rehab SLP Visit Diagnosis: Dysphagia, oral phase (R13.11) Plan: Continue with current plan of care       GO                PageMelanee Spry 05/26/2020, 10:47 AM

## 2020-05-26 NOTE — Progress Notes (Signed)
Admit: 05/24/2020 LOS: 2  23F AKI in setting of poor oral intake, continuation of ACE inhibitor  Subjective:  [name] More awake, alert, conversant this morning  . Creatinine continues to improve, 1.6 this morning with K of 4.2.  1.5 L urine output  10/14 0701 - 10/15 0700 In: 1185.4 [P.O.:390; I.V.:795.4] Out: 1450 [Urine:1450]  Filed Weights   05/25/20 1000 05/25/20 2057  Weight: 93 kg 93 kg    Scheduled Meds: . artificial tears   Left Eye QHS  . aspirin EC  81 mg Oral Daily  . atorvastatin  20 mg Oral QHS  . busPIRone  30 mg Oral BID  . docusate sodium  100 mg Oral BID  . donepezil  10 mg Oral QHS  . feeding supplement  237 mL Oral TID BM  . linaclotide  145 mcg Oral Daily  . melatonin  3 mg Oral QHS  . memantine  28 mg Oral Daily  . multivitamin with minerals  1 tablet Oral Daily  . polyethylene glycol  17 g Oral BID  . polyvinyl alcohol  2 drop Left Eye QID  . senna-docusate  1 tablet Oral QHS  . sodium chloride flush  3 mL Intravenous Q12H   Continuous Infusions: . heparin 850 Units/hr (05/26/20 0313)   PRN Meds:.acetaminophen **OR** acetaminophen, bisacodyl, calcium carbonate (dosed in mg elemental calcium), camphor-menthol **AND** hydrOXYzine, cycloSPORINE, docusate sodium, feeding supplement (NEPRO CARB STEADY), hydrALAZINE, morphine injection, ondansetron **OR** ondansetron (ZOFRAN) IV, oxyCODONE, polyethylene glycol, sorbitol, zolpidem  Current Labs: reviewed    Physical Exam:  Blood pressure 132/62, pulse 62, temperature 97.9 F (36.6 C), temperature source Oral, resp. rate 16, height 5\' 2"  (1.575 m), weight 93 kg, SpO2 95 %. GEN: Elderly female, chronically ill-appearing, NAD, lethargic and somnolent, answers to voice ENT: NCAT, mild temporal wasting EYES: Sunken eyes, EOMI CV: Regular, normal S1-S2 PULM: Coarse breath sounds bilaterally ABD: Soft, nontender SKIN: No rashes, lesions, petechia, purpura EXT: trace edema  A 1. Resolving AKI, suspect is  hypovolemia+ACEi.  Lisinopril has been held.  UA w/o proteinuria or hematuria. Initial UNa < 10.  Imaging w/o obstruction.  Can continue crystalloid until tolerating p.o. but is now volume replete. 2. Debility following submassive PE complicated by acute blood loss anemia and forearm hematoma, ; heparin resumed 3. Confusion/encephalopathy; improved this morning 4. Hypertension, was on ACE inhibitor, held; BPs ok 5. FTT   P . Thankfully GFR is recovering, K is improved . No further suggestions . IV fluids okay if poor p.o., otherwise probably okay to stop . Daily weights, Daily Renal Panel, Strict I/Os, Avoid nephrotoxins (NSAIDs, judicious IV Contrast)  . Will sign off for now, no outpatient nephrology follow-up is necessary.  Call for any questions or concerns.   MD 05/26/2020, 11:43 AM  Recent Labs  Lab 05/24/20 0639 05/25/20 0146 05/26/20 0227  NA 139 141 142  K 5.6* 4.6 4.2  CL 105 107 110  CO2 21* 23 22  GLUCOSE 78 90 90  BUN 103* 90* 67*  CREATININE 3.30* 2.50* 1.58*  CALCIUM 8.4* 8.3* 8.4*   Recent Labs  Lab 05/23/20 0506 05/23/20 0506 05/25/20 0146 05/25/20 1952 05/26/20 0227  WBC 13.3*   < > 10.2 8.9 9.0  NEUTROABS 10.7*  --   --   --   --   HGB 9.2*   < > 8.5* 8.9* 8.4*  HCT 29.7*   < > 28.2* 29.2* 27.5*  MCV 98.7   < > 100.0 98.3 97.5  PLT 158   < > 190 208 227   < > = values in this interval not displayed.

## 2020-05-27 DIAGNOSIS — Z7189 Other specified counseling: Secondary | ICD-10-CM

## 2020-05-27 DIAGNOSIS — Z515 Encounter for palliative care: Secondary | ICD-10-CM

## 2020-05-27 LAB — BASIC METABOLIC PANEL
Anion gap: 10 (ref 5–15)
BUN: 42 mg/dL — ABNORMAL HIGH (ref 8–23)
CO2: 25 mmol/L (ref 22–32)
Calcium: 8.7 mg/dL — ABNORMAL LOW (ref 8.9–10.3)
Chloride: 111 mmol/L (ref 98–111)
Creatinine, Ser: 1.22 mg/dL — ABNORMAL HIGH (ref 0.44–1.00)
GFR, Estimated: 42 mL/min — ABNORMAL LOW (ref >60)
Glucose, Bld: 113 mg/dL — ABNORMAL HIGH (ref 70–99)
Potassium: 4.1 mmol/L (ref 3.5–5.1)
Sodium: 146 mmol/L — ABNORMAL HIGH (ref 135–145)

## 2020-05-27 LAB — CBC
HCT: 30 % — ABNORMAL LOW (ref 36.0–46.0)
Hemoglobin: 9.3 g/dL — ABNORMAL LOW (ref 12.0–15.0)
MCH: 30.5 pg (ref 26.0–34.0)
MCHC: 31 g/dL (ref 30.0–36.0)
MCV: 98.4 fL (ref 80.0–100.0)
Platelets: 259 10*3/uL (ref 150–400)
RBC: 3.05 MIL/uL — ABNORMAL LOW (ref 3.87–5.11)
RDW: 14.8 % (ref 11.5–15.5)
WBC: 10 10*3/uL (ref 4.0–10.5)
nRBC: 0 % (ref 0.0–0.2)

## 2020-05-27 LAB — HEPARIN LEVEL (UNFRACTIONATED): Heparin Unfractionated: 0.37 IU/mL (ref 0.30–0.70)

## 2020-05-27 MED ORDER — BISACODYL 5 MG PO TBEC: 5.0000 mg | DELAYED_RELEASE_TABLET | Freq: Every day | ORAL | Status: DC | PRN

## 2020-05-27 MED ORDER — ORAL CARE MOUTH RINSE
15.0000 mL | Freq: Two times a day (BID) | OROMUCOSAL | Status: DC
  Administered 2020-05-27 – 2020-05-30 (×7): 15 mL via OROMUCOSAL

## 2020-05-27 MED ORDER — APIXABAN 5 MG PO TABS
5.0000 mg | ORAL_TABLET | Freq: Two times a day (BID) | ORAL | Status: DC
  Administered 2020-05-27 – 2020-05-30 (×7): 5 mg via ORAL
  Filled 2020-05-27 (×7): qty 1

## 2020-05-27 MED ORDER — SORBITOL 70 % SOLN: 30.0000 mL | Status: DC | PRN

## 2020-05-27 NOTE — Progress Notes (Signed)
[facility] ([facility])  Referral received for hospice services at home once pt discharges.  Met with pt in the room, no family present, advised Mrs. Hulen that this write was here to support her going home.  Son requested we not mention hospice.  Spoke with [name], son and NOK, he endorses need for hospice. [name] denies any DME needs currently but asks that we speak with his wife Marilyn who is a nurse manager.  Called and left message for Marilyn.  Address correct in chart.  Appears that there is not a set d/c date yet.   Should pt decide on DNR, please send completed form with her at discharge time.  Thank you, Jennifer Woody RN, BSN, CCRN [facility] Hospital Liaison  

## 2020-05-27 NOTE — Progress Notes (Signed)
ANTICOAGULATION CONSULT NOTE - Follow Up Consult  Pharmacy Consult for IV Heparin Indication: History of Saddle PE (05/05/2020)  Allergies  Allergen Reactions   T-Pa [Alteplase] Swelling    Does okay with it if premedicated with benadryl.   Codeine Itching    Patient Measurements: Height: 5\' 2"  (157.5 cm) (05/13/2020) Weight: 93 kg (205 lb 0.4 oz) IBW/kg (Calculated) : 50.1 Heparin Dosing Weight: 71.7 kg  Vital Signs: Temp: 99 F (37.2 C) (10/16 0446) Temp Source: Oral (10/16 0446) BP: 141/63 (10/16 0446) Pulse Rate: 59 (10/16 0446)  Labs: Recent Labs    05/25/20 0146 05/25/20 0146 05/25/20 1952 05/25/20 1952 05/26/20 0227 05/26/20 1017 05/27/20 0416  HGB 8.5*   < > 8.9*   < > 8.4*  --  9.3*  HCT 28.2*   < > 29.2*  --  27.5*  --  30.0*  PLT 190   < > 208  --  227  --  259  HEPARINUNFRC  --   --  0.30   < > 0.32 0.36 0.37  CREATININE 2.50*  --   --   --  1.58*  --  1.22*   < > = values in this interval not displayed.    Estimated Creatinine Clearance: 39.7 mL/min (A) (by C-G formula based on SCr of 1.22 mg/dL (H)).   Medications:  Infusions:   sodium chloride 250 mL (05/26/20 2158)   heparin 850 Units/hr (05/26/20 1622)    Assessment: 79 years of age female with recent diagnosis of saddle pulmonary embolus on 05/05/2020 who developed a right upper extremity hematoma on heparin during that admission and anticoagulation was discontinued and IVC filter placed. Plan was to re-challenge anticoagulation after 2 weeks. Patient has been re-admitted from rehab with lethargy, altered mental status, and renal failure. Pharmacy was consulted 10/14 to start IV heparin drip with no bolus for h/o saddle PE.   Confirmed with RN on 10/14 - no overt bleeding noted. Some bruising- noted on admission.  Heparin level is  therapeutic x 3 on 850 ut/hr IV heparin.  H/H is low but stable - no acute decrease. Platelets are stable and within normal limits. No bleeding reported confirmed  with RN 10/15.    Goal of Therapy:  Heparin level 0.3-0.7 units/ml Monitor platelets by anticoagulation protocol: Yes   Plan:  Continue Heparin to 850 units/hr.  Daily HL, CBC  Geoffry Bannister A. 11/15, PharmD, BCPS, FNKF Clinical Pharmacist [place] Please utilize Amion for appropriate phone number to reach the unit pharmacist [facility])   05/27/2020,7:54 AM

## 2020-05-27 NOTE — Progress Notes (Signed)
Patient refused her food for breakfast, lunch, and dinner stating "I don't want this food it doesn't taste good. Patient has been educated and MD has been notified. There are no new orders at this time and RN will continue to monitor this patient and offer the prescribed Ensure

## 2020-05-27 NOTE — TOC Progression Note (Signed)
Transition of Care [facility]) - Progression Note    Patient Details  Name: [name] MRN: [id number] Date of Birth: [birth year]  [facility] CM/SW Contact  Bess Kinds, RN Phone Number: [phone] 05/27/2020, 12:09 PM  Clinical Narrative:     Acknowledging consult for home hospice referral. Noted preference for [facility]. Referral sent to [facility] for follow up. TOC following for transition needs.   Expected Discharge Plan: Skilled Nursing Facility Barriers to Discharge: Continued Medical Work up  Expected Discharge Plan and Services Expected Discharge Plan: Skilled Nursing Facility In-house Referral: Clinical Social Work   Post Acute Care Choice: Skilled Nursing Facility Living arrangements for the past 2 months: Single Family Home                                       Social Determinants of Health (SDOH) Interventions    Readmission Risk Interventions No flowsheet data found.

## 2020-05-27 NOTE — Progress Notes (Signed)
PROGRESS NOTE  JENAH VANASTEN YIF:[id number] DOB: [birth year] DOA: 05/24/2020 PCP: [name], MD  Brief History   [name] is a 79 y.o. 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  Evaluation subsequently revealed saddle PE with cor pulmonale and she was treated with tPA. She had LUE hematoma requiring transfusion, and anticoagulation was subsequently discontinued.  She had R hip cellulitis treated with Vanc and E coli UTI treated with Keflex while hospitalized.  IVC filter was placed 9/29.  While in rehab, she has become increasingly lethargic and is no longer actively participating in therapy.  She has had progressive renal failure despite IVF infusion.  As such, CIR requested transfer back to the acute hospital.  At the time of my evaluation, the patient had just arrived from CIR to 1M.  She was pleasant and appeared to attempt to smile once but was overall very stoic.  She answered very limited questions.  She appears to deny pain.  Her DIL reports that she had passed out initially and they thinking it was a vagal response - but it was large DVT/PE.  She was doing fine until her left arm developed the hematoma and this caused terrible pain and she has been going downhill since then.  Following the CVA, her endurance wasn't that good, could only walk short distances.  The left eye issue is chronic and she wore a patch at night and has been putting medications in her eye.    Prior to rehab the patient was admitted for acute saddle embolism and treated with tPA.  She developed L arm hematoma and had IVC filter placed on 9/29.  She was admitted to rehab and was participating but has been increasingly weak and withdrawn and so unable to participate in therapy.  The patient has been found to have AKI with an increase in creatinine from baseline of 1.0 in  to 2.69. Likely due to ATN from  vancomycin and possibly poor PO intake. Monitor.  The patient has been evaluated by PT/OT. They have recommended SNF placement for the patient.  Consultants   None  Procedures   None  Antibiotics   Anti-infectives (From admission, onward)   None     Subjective  The patient is moribund and lethargic as she lies in bed. She tells me that she is tired.  Objective   Vitals:  Vitals:   05/27/20 0446 05/27/20 0932  BP: (!) 141/63 136/74  Pulse: (!) 59 62  Resp: 17 18  Temp: 99 F (37.2 C) 98.8 F (37.1 C)  SpO2: 97% 97%   Exam:  Constitutional:   The patient is awake, alert, and oriented x 3. No acute distress. Respiratory:   No increased work of breathing.  No wheezes, rales, or rhonchi  No tactile fremitus Cardiovascular:   Regular rate and rhythm  No murmurs, ectopy, or gallups.  No lateral PMI. No thrills. Abdomen:   Abdomen is soft, non-tender, non-distended  No hernias, masses, or organomegaly  Normoactive bowel sounds.  Musculoskeletal:   No cyanosis, clubbing, or edema Skin:   No rashes, lesions, ulcers  palpation of skin: no induration or nodules Neurologic:  She is moving all extremities. Responds verbally, but slowly. Pt is lethargic and confused. I have personally reviewed the following:   Today's Data   Vitals, BMP, CBC, Glucoses  Scheduled Meds:  apixaban  5 mg Oral BID   artificial tears  Left Eye QHS   aspirin EC  81 mg Oral Daily   atorvastatin  20 mg Oral QHS   busPIRone  30 mg Oral BID   docusate sodium  100 mg Oral BID   donepezil  10 mg Oral QHS   feeding supplement  237 mL Oral TID BM   linaclotide  145 mcg Oral Daily   mouth rinse  15 mL Mouth Rinse BID   melatonin  3 mg Oral QHS   memantine  28 mg Oral Daily   multivitamin with minerals  1 tablet Oral Daily   polyethylene glycol  17 g Oral BID   polyvinyl alcohol  2 drop Left Eye QID   senna-docusate  1 tablet Oral QHS   sodium chloride  flush  3 mL Intravenous Q12H   Continuous Infusions:  sodium chloride 250 mL (05/26/20 2158)    Principal Problem:   Acute renal failure (ARF) (HCC) Active Problems:   OSA on CPAP   HTN (hypertension)   Acute massive pulmonary embolism (HCC)   Takotsubo cardiomyopathy   Dyslipidemia   Dementia (HCC)   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 Creatinine is 1.58 today in contrast to 3.3 on admission. Likely due to combination of poor PO intake and ATN from vancomycin. Nephrology has been consulted. There is some consideration of HD, although she is currently a very poor long-term candidate for HD and likely also short-term. Palliative care consultation requested. Hold Lisinopril and other nephrotoxic medications. Avoid hypotension. Monitor creatinine, electrolytes, and volume status. Plan is that she will be discharge to home with hospice.  Neurologic deficits with h/o CVA: Previously on Plavix but this was held due to her arm hematoma. She has notable left sided facial weakness, but I understand that this has been present since her arrival at rehab. MRI has been performed and is negative for acute intracranial abnormality. 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 She continues to receive ASA 81 mg with 20 mg of lipitor daily.  Hypertension: The patient is normotensive without antihypertensive medications. Monitor.  Recent large bilateral acute pulmonary embolism with saddle emboli/at least submassive PE with cor pulmonale: The patient is status post TPA. She was initially on heparin drip. This therapy was complicated by left upper  extremity hematoma/bleeding therefore heparin drip discontinued. She had an IVC filter placed on 05/01/2020. The plan had been to rechallenge with AC 2 weeks from this date. Phaarmacy has been consulted to place the patient back on a heparin drip. Echocardiogram from 05/06/2020 shows EF of 60% with some right heart heart strain and tricuspid regurgitation.Plan is that she will be discharge to home with hospice.  Left upper extremity hematoma: Pt is to keep her arm elevated. She is receiving pain medication  (Oxycodone, fentanyl) and a bowel regimen has been prescribed. Current UE findings could be associated with hematoma rather than CVA. Plan is that she will be discharge to home with hospice.  Acute blood loss anemia: CT abdomen pelvis was previously negative for retroperitoneal bleed.After transfusion hemoglobin is slowly down-trending. Continue to monitor hemoglobin and check an anemia panel. Hemoglobin is stable at 8.9. Plan is that she  will be discharge to home with hospice.  History of  E. Coli UTI and R hip cellulitis: She has completed a course of keflex for UTI and vancomycin for cellulitis. Plan is that she will be discharge to home with hospice.  Takotsubo cardiomyopathy: Noted. Patient with apparent Takotsubo's based on severely depressed EF on echo 09/16/19 but cath with no significant CAD. As of 9/25, EF has rebounded to EF 60-65% and she has grade 1 diastolic dysfunction No current evidence of decompensated CHF as cause for current presentation. Plan is that she will be discharge to home with hospice.  Dementia/chronic anxiety: Continue home meds - Aricept, Namenda, BuSpar, melatonin. Palliative care consulted. Plan is that she will be discharge to home with hospice.  I have seen and examined this patient myself. I have spent 33 minutes in her evaluation and care.Plan is that she will be discharge to home with hospice.  DVT prophylaxis: SCDs Code Status: Limited - DNI -  confirmed with family Family Communication: None present. Disposition Plan:The patient is from: home             Anticipated d/c is to: Home with hospice.             Anticipated d/c date will depend on clinical response to treatment, likely  days to weeks.             Patient is currently: acutely ill   Lenardo Westwood, DO [facility] Direct contact: see [email or web address]  7PM-7AM contact night coverage as above 05/27/2020, 4:56 PM  LOS: 1 day

## 2020-05-27 NOTE — Progress Notes (Signed)
ANTICOAGULATION CONSULT NOTE - Follow Up Consult  Pharmacy Consult for IV Heparin>>Apixaban Indication: History of Saddle PE (05/05/2020)  Allergies  Allergen Reactions  . T-Pa [Alteplase] Swelling    Does okay with it if premedicated with benadryl.  . Codeine Itching    Patient Measurements: Height: 5\' 2"  (157.5 cm) (05/13/2020) Weight: 93 kg (205 lb 0.4 oz) IBW/kg (Calculated) : 50.1 Heparin Dosing Weight: 71.7 kg  Vital Signs: Temp: 98.8 F (37.1 C) (10/16 0932) Temp Source: Oral (10/16 0932) BP: 136/74 (10/16 0932) Pulse Rate: 62 (10/16 0932)  Labs: Recent Labs    05/25/20 0146 05/25/20 0146 05/25/20 1952 05/25/20 1952 05/26/20 0227 05/26/20 1017 05/27/20 0416  HGB 8.5*   < > 8.9*   < > 8.4*  --  9.3*  HCT 28.2*   < > 29.2*  --  27.5*  --  30.0*  PLT 190   < > 208  --  227  --  259  HEPARINUNFRC  --   --  0.30   < > 0.32 0.36 0.37  CREATININE 2.50*  --   --   --  1.58*  --  1.22*   < > = values in this interval not displayed.    Estimated Creatinine Clearance: 39.7 mL/min (A) (by C-G formula based on SCr of 1.22 mg/dL (H)).   Medications:  Infusions:  . sodium chloride 250 mL (05/26/20 2158)  . heparin 850 Units/hr (05/26/20 1622)    Assessment: 79 years of age female with recent diagnosis of saddle pulmonary embolus on 05/05/2020 who developed a right upper extremity hematoma on heparin during that admission and anticoagulation was discontinued and IVC filter placed. Plan was to re-challenge anticoagulation after 2 weeks. Patient has been re-admitted from rehab with lethargy, altered mental status, and renal failure. Pharmacy was consulted 10/14 to start IV heparin drip with no bolus for h/o saddle PE. Patient now to be started on apixaban PO.   No overt bleeding noted. Some bruising- noted on admission.  Heparin level is  therapeutic x 3 on 850 ut/hr IV heparin.  H/H is low but stable - no acute decrease. Platelets are stable and within normal limits.  Goal  of Therapy:  Heparin level 0.3-0.7 units/ml Monitor platelets by anticoagulation protocol: Yes   Plan:  Start Apixaban 5mg  PO BID Discontinue Heparin drip once apixaban given    Curt Oatis A. 11/14, PharmD, BCPS, FNKF Clinical Pharmacist [place] Please utilize Amion for appropriate phone number to reach the unit pharmacist [facility])   05/27/2020,1:00 PM

## 2020-05-28 LAB — CBC
HCT: 28.8 % — ABNORMAL LOW (ref 36.0–46.0)
Hemoglobin: 8.8 g/dL — ABNORMAL LOW (ref 12.0–15.0)
MCH: 30.9 pg (ref 26.0–34.0)
MCHC: 30.6 g/dL (ref 30.0–36.0)
MCV: 101.1 fL — ABNORMAL HIGH (ref 80.0–100.0)
Platelets: 270 10*3/uL (ref 150–400)
RBC: 2.85 MIL/uL — ABNORMAL LOW (ref 3.87–5.11)
RDW: 14.9 % (ref 11.5–15.5)
WBC: 8.8 10*3/uL (ref 4.0–10.5)
nRBC: 0 % (ref 0.0–0.2)

## 2020-05-28 MED ORDER — DRONABINOL 2.5 MG PO CAPS
2.5000 mg | ORAL_CAPSULE | Freq: Two times a day (BID) | ORAL | Status: DC
  Administered 2020-05-28 – 2020-05-30 (×4): 2.5 mg via ORAL
  Filled 2020-05-28 (×4): qty 1

## 2020-05-28 MED ORDER — MEGESTROL ACETATE 40 MG PO TABS
40.0000 mg | ORAL_TABLET | Freq: Every day | ORAL | Status: DC
  Administered 2020-05-28: 40 mg via ORAL
  Filled 2020-05-28: qty 1

## 2020-05-28 NOTE — Progress Notes (Addendum)
PROGRESS NOTE  [name]:[id number] DOB: 1941/04/06 DOA: 05/24/2020 PCP: [name], MD  Brief History   [name] is a 79 y.o. 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  Evaluation subsequently revealed saddle PE with cor pulmonale and she was treated with tPA. She had LUE hematoma requiring transfusion, and anticoagulation was subsequently discontinued.  She had R hip cellulitis treated with Vanc and E coli UTI treated with Keflex while hospitalized.  IVC filter was placed 9/29.  While in rehab, she has become increasingly lethargic and is no longer actively participating in therapy.  She has had progressive renal failure despite IVF infusion.  As such, CIR requested transfer back to the acute hospital.   At the time of my evaluation, the patient had just arrived from CIR to 72M.  She was pleasant and appeared to attempt to smile once but was overall very stoic.  She answered very limited questions.  She appears to deny pain.  Her DIL reports that she had passed out initially and they thinking it was a vagal response - but it was large DVT/PE.  She was doing fine until her left arm developed the hematoma and this caused terrible pain and she has been going downhill since then.  Following the CVA, her endurance wasn't that good, could only walk short distances.  The left eye issue is chronic and she wore a patch at night and has been putting medications in her eye.     Prior to rehab the patient was admitted for acute saddle embolism and treated with tPA.  She developed L arm hematoma and had IVC filter placed on 9/29.  She was admitted to rehab and was participating but has been increasingly weak and withdrawn and so unable to participate in therapy.  The patient has been found to have AKI with an increase in creatinine from baseline of 1.0 in  to 2.69. Likely due to ATN from  vancomycin and possibly poor PO intake. Monitor.  The patient has been evaluated by PT/OT. They have recommended SNF placement for the patient. However, the patient will go home with hospice. She continues to refuse PO nutrition.  Consultants  Palliative care.  Procedures  None  Antibiotics   Anti-infectives (From admission, onward)    None      Subjective  The patient is moribund and lethargic as she lies in bed. Refusing PO food.  Objective   Vitals:  Vitals:   05/28/20 0454 05/28/20 0940  BP: 128/63 (!) 160/83  Pulse: (!) 57 (!) 58  Resp: 17 18  Temp: 98.8 F (37.1 C) (!) 97.5 F (36.4 C)  SpO2: 95% 97%   Exam:  Constitutional:  The patient is lethargic. No acute distress. Respiratory:  No increased work of breathing. No wheezes, rales, or rhonchi No tactile fremitus Cardiovascular:  Regular rate and rhythm No murmurs, ectopy, or gallups. No lateral PMI. No thrills. Abdomen:  Abdomen is soft, non-tender, non-distended No hernias, masses, or organomegaly Normoactive bowel sounds.  Musculoskeletal:  No cyanosis, clubbing, or edema Skin:  No rashes, lesions, ulcers palpation of skin: no induration or nodules Neurologic:  She is moving all extremities. Responds verbally, but slowly. Pt is lethargic and confused. I have personally reviewed the following:   Today's Data  Vitals, BMP, CBC, Glucoses  Scheduled Meds:  apixaban  5 mg Oral BID   artificial tears   Left Eye  QHS   aspirin EC  81 mg Oral Daily   atorvastatin  20 mg Oral QHS   busPIRone  30 mg Oral BID   docusate sodium  100 mg Oral BID   donepezil  10 mg Oral QHS   feeding supplement  237 mL Oral TID BM   linaclotide  145 mcg Oral Daily   mouth rinse  15 mL Mouth Rinse BID   megestrol  40 mg Oral Daily   melatonin  3 mg Oral QHS   memantine  28 mg Oral Daily   multivitamin with minerals  1 tablet Oral Daily   polyethylene glycol  17 g Oral BID   polyvinyl alcohol  2 drop Left Eye  QID   senna-docusate  1 tablet Oral QHS   sodium chloride flush  3 mL Intravenous Q12H   Continuous Infusions:  sodium chloride Stopped (05/27/20 2154)    Principal Problem:   Acute renal failure (ARF) (HCC) Active Problems:   OSA on CPAP   HTN (hypertension)   Acute massive pulmonary embolism (HCC)   Takotsubo cardiomyopathy   Dyslipidemia   Dementia (HCC)   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 Creatinine is 1.58 today in contrast to 3.3 on admission. Likely due to combination of poor PO intake and ATN from vancomycin. Nephrology has been consulted. There is some consideration of HD, although she is currently a very poor long-term candidate for HD and likely also short-term. Palliative care consultation requested. Hold Lisinopril and other nephrotoxic medications. Avoid hypotension. Monitor creatinine, electrolytes, and volume status. Plan is that she will be discharge to home with hospice.   Neurologic deficits with h/o CVA: Previously on Plavix but this was held due to her arm hematoma. She has notable left sided facial weakness, but I understand that this has been present since her arrival at rehab. MRI has been performed and is negative for acute intracranial abnormality. 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 She continues to receive ASA 81 mg with 20 mg of lipitor daily.  Anorexia: Patient is refusing PO nutrition saying that it doesn't taste good. I have started her on Marinol. Monitor for improvement.  Hypertension: The patient is normotensive without antihypertensive medications. Monitor.   Recent large bilateral  acute pulmonary embolism with saddle emboli/at least submassive PE with cor pulmonale: The patient is status post TPA. She was initially on heparin drip. This therapy was complicated by left upper extremity hematoma/bleeding therefore heparin drip discontinued.  She had an IVC filter placed on 05/01/2020. The plan had been to rechallenge with AC 2 weeks from this date. Phaarmacy has been consulted to place the patient back on a heparin drip. Echocardiogram from 05/06/2020 shows EF of 60% with some right heart heart strain and tricuspid regurgitation. Plan is that she will be discharge to home with hospice.  Left upper extremity hematoma: Pt is to keep her arm elevated.  She is receiving pain medication  (Oxycodone, fentanyl) and a bowel regimen has been prescribed. Current UE findings could be associated with hematoma rather than CVA. Plan is that she will be discharge to home with hospice.   Acute blood loss  anemia: CT abdomen pelvis was previously negative for retroperitoneal bleed.  After transfusion hemoglobin is slowly down-trending. Continue to monitor hemoglobin and check an anemia panel. Hemoglobin is stable at 8.9. Plan is that she will be discharge to home with hospice.   History of  E. Coli UTI and R hip cellulitis: She has completed a course of keflex for UTI and vancomycin for cellulitis. Plan is that she will be discharge to home with hospice.   Takotsubo cardiomyopathy: Noted. Patient with apparent Takotsubo's based on severely depressed EF on echo 09/16/19 but cath with no significant CAD. As of 9/25, EF has rebounded to EF 60-65% and she has grade 1 diastolic dysfunction No current evidence of decompensated CHF as cause for current presentation. Plan is that she will be discharge to home with hospice.  Metabolic encephalopathy in the setting of underlying dementia: Pt is lethargic and pauci verbal and minimally interactive.   Dementia/chronic anxiety: Continue home meds - Aricept, Namenda,  BuSpar, melatonin. Palliative care consulted. Plan is that she will be discharge to home with hospice.   I have seen and examined this patient myself. I have spent 33 minutes in her evaluation and care. Plan is that she will be discharge to home with hospice.   DVT prophylaxis: SCDs Code Status: Limited - DNI - confirmed with family Family Communication: None present. Disposition Plan:  The patient is from: home             Anticipated d/c is to: Home with hospice.             Anticipated d/c date: 1 day             Patient is currently: acutely ill   Esterlene Atiyeh, DO [facility] Direct contact: see [email or web address]  7PM-7AM contact night coverage as above 05/28/2020, 3:49 PM  LOS: 1 day

## 2020-05-29 DIAGNOSIS — R630 Anorexia: Secondary | ICD-10-CM

## 2020-05-29 DIAGNOSIS — Z515 Encounter for palliative care: Secondary | ICD-10-CM

## 2020-05-29 DIAGNOSIS — Z7189 Other specified counseling: Secondary | ICD-10-CM

## 2020-05-29 LAB — CBC
HCT: 29 % — ABNORMAL LOW (ref 36.0–46.0)
Hemoglobin: 8.7 g/dL — ABNORMAL LOW (ref 12.0–15.0)
MCH: 29.6 pg (ref 26.0–34.0)
MCHC: 30 g/dL (ref 30.0–36.0)
MCV: 98.6 fL (ref 80.0–100.0)
Platelets: 277 10*3/uL (ref 150–400)
RBC: 2.94 MIL/uL — ABNORMAL LOW (ref 3.87–5.11)
RDW: 14.6 % (ref 11.5–15.5)
WBC: 9.5 10*3/uL (ref 4.0–10.5)
nRBC: 0 % (ref 0.0–0.2)

## 2020-05-29 NOTE — Progress Notes (Signed)
  Civil engineer, contracting [facility])  Received request from [facility] for hospice services at home after discharge.  Chart and pt information have been reviewed by [facility] physician.  Patient has been deemed hospice eligible.  Hospital liaison spoke with pt's son [name] to continue education related to hospice philosophy and services and to answer any questions at this time.  DME needs discussed.   Hospital bed, OBT, and Hoyer lift needed.  Son [name] asks that it be delivered tomorrow morning to allow him time to move the existing bed out of the way.  Address has been verified and is correct in the chart.  [facility] information and contact numbers given to [name].  Above information shared with Caprice Beaver Manager.  Please call with any questions or concerns.  Thank you for the opportunity to participate in this pt's care.  Gillian Scarce, BSN, RN [employer] [phone] (24h on call)

## 2020-05-29 NOTE — Progress Notes (Signed)
PROGRESS NOTE  [name] RCB:[id number] DOB: Jun 02, 1941 DOA: 05/24/2020 PCP: [name], MD  Brief History   [name] is a 79 y.o. 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  Evaluation subsequently revealed saddle PE with cor pulmonale and she was treated with tPA. She had LUE hematoma requiring transfusion, and anticoagulation was subsequently discontinued.  She had R hip cellulitis treated with Vanc and E coli UTI treated with Keflex while hospitalized.  IVC filter was placed 9/29.  While in rehab, she has become increasingly lethargic and is no longer actively participating in therapy.  She has had progressive renal failure despite IVF infusion.  As such, CIR requested transfer back to the acute hospital.   At the time of my evaluation, the patient had just arrived from CIR to 63M.  She was pleasant and appeared to attempt to smile once but was overall very stoic.  She answered very limited questions.  She appears to deny pain.  Her DIL reports that she had passed out initially and they thinking it was a vagal response - but it was large DVT/PE.  She was doing fine until her left arm developed the hematoma and this caused terrible pain and she has been going downhill since then.  Following the CVA, her endurance wasn't that good, could only walk short distances.  The left eye issue is chronic and she wore a patch at night and has been putting medications in her eye.     Prior to rehab the patient was admitted for acute saddle embolism and treated with tPA.  She developed L arm hematoma and had IVC filter placed on 9/29.  She was admitted to rehab and was participating but has been increasingly weak and withdrawn and so unable to participate in therapy.  The patient has been found to have AKI with an increase in creatinine from baseline of 1.0 in  to 2.69. Likely due to ATN from  vancomycin and possibly poor PO intake. Monitor.  The patient has been evaluated by PT/OT. They have recommended SNF placement for the patient. However, the patient will go home with hospice. Family will have Hoyer lift, hospital bed and over the bed table delivered to them tomorrow. She will be discharged to home with hospice when all needs are arranged for. She continues to refuse PO nutrition.  Consultants  . Palliative care. . hospice  Procedures  . None  Antibiotics   Anti-infectives (From admission, onward)   None     Subjective  The patient is moribund and lethargic as she lies in bed. No new complaints.  Objective   Vitals:  Vitals:   05/29/20 0423 05/29/20 0852  BP: (!) 148/81 (!) 149/62  Pulse: (!) 54 (!) 46  Resp: 18 14  Temp: 98.1 F (36.7 C) 97.7 F (36.5 C)  SpO2: 97% 98%   Exam:  Constitutional:  . The patient is lethargic. No acute distress. Respiratory:  . No increased work of breathing. . No wheezes, rales, or rhonchi . No tactile fremitus Cardiovascular:  . Regular rate and rhythm . No murmurs, ectopy, or gallups. . No lateral PMI. No thrills. Abdomen:  . Abdomen is soft, non-tender, non-distended . No hernias, masses, or organomegaly . Normoactive bowel sounds.  Musculoskeletal:  . No cyanosis, clubbing, or edema Skin:  . No rashes, lesions, ulcers . palpation of skin: no induration or nodules Neurologic:  She is  moving all extremities. Responds verbally, but slowly. Pt is lethargic and confused. I have personally reviewed the following:   Today's Data  . Vitals, BMP, CBC, Glucoses  Scheduled Meds: . apixaban  5 mg Oral BID  . artificial tears   Left Eye QHS  . aspirin EC  81 mg Oral Daily  . atorvastatin  20 mg Oral QHS  . busPIRone  30 mg Oral BID  . docusate sodium  100 mg Oral BID  . donepezil  10 mg Oral QHS  . dronabinol  2.5 mg Oral BID AC  . feeding supplement  237 mL Oral TID BM  . linaclotide  145 mcg Oral Daily  .  mouth rinse  15 mL Mouth Rinse BID  . melatonin  3 mg Oral QHS  . memantine  28 mg Oral Daily  . multivitamin with minerals  1 tablet Oral Daily  . polyethylene glycol  17 g Oral BID  . polyvinyl alcohol  2 drop Left Eye QID  . senna-docusate  1 tablet Oral QHS  . sodium chloride flush  3 mL Intravenous Q12H   Continuous Infusions: . 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 Creatinine is 1.58 today in contrast to 3.3 on admission. Likely due to combination of poor PO intake and ATN from vancomycin. Nephrology has been consulted. There is some consideration of HD, although she is currently a very poor long-term candidate for HD and likely also short-term. Palliative care consultation requested. Hold Lisinopril and other nephrotoxic medications. Avoid hypotension. Monitor creatinine, electrolytes, and volume status. Plan is that she will be discharge to home with hospice. Family will have Hoyer lift, hospital bed and over the bed table delivered to them tomorrow. She will be discharged to home with hospice when all needs are arranged for. She continues to refuse PO nutrition.   Neurologic deficits with h/o CVA: Previously on Plavix but this was held due to her arm hematoma. She has notable left sided facial weakness, but I understand that this has been present since her arrival at rehab. MRI has been performed and is negative for acute intracranial abnormality. There are multifocal chronic small vessel ischemic changes due to  chronic hypertensive aniopathy. 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 She continues to receive ASA 81 mg with 20 mg of lipitor daily.  Anorexia: Patient is refusing PO nutrition saying that it doesn't taste good. I have started her on Marinol. Monitor for improvement.  Hypertension: The patient is normotensive without antihypertensive medications. Monitor.   Recent large bilateral acute pulmonary embolism with saddle emboli/at least submassive PE with cor pulmonale: The patient is status post TPA. She was initially on heparin drip. This therapy was complicated by left upper extremity hematoma/bleeding therefore heparin drip discontinued.  She had an IVC filter placed on 05/01/2020. The plan had been to rechallenge with AC 2 weeks from this date. Phaarmacy has been consulted to place the patient back on a heparin  drip. Echocardiogram from 05/06/2020 shows EF of 60% with some right heart heart strain and tricuspid regurgitation. Plan is that she will be discharge to home with hospice.  Left upper extremity hematoma: Pt is to keep her arm elevated.  She is receiving pain medication  (Oxycodone, fentanyl) and a bowel regimen has been prescribed. Current UE findings could be associated with hematoma rather than CVA. Plan is that she will be discharge to home with hospice.   Acute blood loss anemia: CT abdomen pelvis was previously negative for retroperitoneal bleed.  After transfusion hemoglobin is slowly down-trending. Continue to monitor hemoglobin and check an anemia panel. Hemoglobin is stable at 8.9. Plan is that she will be discharge to home with hospice.   History of  E. Coli UTI and R hip cellulitis: She has completed a course of keflex for UTI and vancomycin for cellulitis. Plan is that she will be discharge to home with hospice.   Takotsubo cardiomyopathy: Noted. Patient with apparent Takotsubo's based on severely depressed EF on echo 09/16/19 but  cath with no significant CAD. As of 9/25, EF has rebounded to EF 60-65% and she has grade 1 diastolic dysfunction No current evidence of decompensated CHF as cause for current presentation. Plan is that she will be discharge to home with hospice.  Metabolic encephalopathy in the setting of underlying dementia: Pt is lethargic and pauci verbal and minimally interactive.   Dementia/chronic anxiety: Continue home meds - Aricept, Namenda, BuSpar, melatonin. Palliative care consulted. Plan is that she will be discharge to home with hospice.   I have seen and examined this patient myself. I have spent 35 minutes in her evaluation and care. Plan is that she will be discharge to home with hospice.   DVT prophylaxis: SCDs Code Status: Limited - DNI - confirmed with family Family Communication: None present. Disposition Plan:  The patient is from: home             Anticipated d/c is to: Home with hospice.             Anticipated d/c date: 1 day             Patient is currently: acutely ill   Gracy Ehly, DO [facility] Direct contact: see [email or web address]  7PM-7AM contact night coverage as above 05/29/2020, 2:37 PM  LOS: 1 day

## 2020-05-29 NOTE — Plan of Care (Signed)
  Problem: Clinical Measurements: Goal: Respiratory complications will improve Outcome: Progressing   Problem: Pain Managment: Goal: General experience of comfort will improve Outcome: Progressing   Problem: Safety: Goal: Ability to remain free from injury will improve Outcome: Progressing   Problem: Skin Integrity: Goal: Risk for impaired skin integrity will decrease Outcome: Progressing   

## 2020-05-29 NOTE — Progress Notes (Addendum)
Physical Therapy Treatment Patient Details Name: [name] MRN: [id number] DOB: 06-May-1941 Today's Date: 05/29/2020    History of Present Illness The pt is a 79 yo female presenting from CIR due to AKI with increasing  lethargy and creatine levels despite IVF infusion. The pt's was reccently admissted 9/24 due to acute saddle embolism, development of LUE hematoma, and palcement of IVC filter on 9/29. PMH includes: CVA in 02/2020, Takotsubo cardiomyopathy, DVT/PE, HTN, and obstructive sleep apnea.    PT Comments    Pt received in bed, very pleasant but declining OOB or transition to EOB. Agreeable to exercises and bed in chair position. Pt performed LE exercises supine. Bed then transitioned to chair position. Pt able to pull forward into sitting using bilat lower bedrails and mod assist. Pull to sit x 2 trials. Pt able to maintain x 30 sec first trial and 10 sec second trial. Rolling in supine, mod assist left and max assist right. Per chart, plan is for d/c home with hospice. Recommend hoyer lift and hospital bed, if pt d/c's home instead of SNF. Pt has RW, BSC and wheelchair at home.    Follow Up Recommendations  SNF;Supervision/Assistance - 24 hour     Equipment Recommendations  Other (comment);Hospital bed (hoyer lift, hoyer lift pad)    Recommendations for Other Services       Precautions / Restrictions Precautions Precautions: Fall Precaution Comments: L-sided residual walkness, hematoma LUE    Mobility  Bed Mobility Overal bed mobility: Needs Assistance Bed Mobility: Rolling Rolling: Mod assist;Max assist         General bed mobility comments: mod assist rolling left, max assist rolling right  Transfers Overall transfer level: Needs assistance               General transfer comment: Declining OOB or EOB. Bed placed in chair position. Bottom 2 bed rails raised for pt to pull forward into unsupported sitting. mod assist x 2 reps.  Ambulation/Gait                  Stairs             Wheelchair Mobility    Modified Rankin (Stroke Patients Only)       Balance Overall balance assessment: Needs assistance Sitting-balance support: Bilateral upper extremity supported;Feet unsupported Sitting balance-Leahy Scale: Poor Sitting balance - Comments: pulling forward to sit with bed in chair position. BUE support on bed rails and therapist support to maintain balance.                                    Cognition Arousal/Alertness: Awake/alert Behavior During Therapy: WFL for tasks assessed/performed Overall Cognitive Status: Within Functional Limits for tasks assessed                                        Exercises General Exercises - Lower Extremity Ankle Circles/Pumps: AROM;Both;10 reps;Supine Short Arc Quad: AROM;Right;Left;10 reps;Supine Heel Slides: AROM;Right;Left;AAROM;10 reps;Supine Hip ABduction/ADduction: AROM;AAROM;Right;Left;10 reps;Supine    General Comments General comments (skin integrity, edema, etc.): resting HR 45      Pertinent Vitals/Pain Pain Assessment: No/denies pain Faces Pain Scale: Hurts whole lot Pain Location: LUE with movement and palpation Pain Descriptors / Indicators: Grimacing;Moaning;Discomfort;Tender Pain Intervention(s): Monitored during session;Repositioned;Limited activity within patient's tolerance    Home Living  Prior Function            PT Goals (current goals can now be found in the care plan section) Acute Rehab PT Goals Patient Stated Goal: home Progress towards PT goals: Progressing toward goals    Frequency    Min 2X/week      PT Plan Equipment recommendations need to be updated    Co-evaluation              AM-PAC PT "6 Clicks" Mobility   Outcome Measure  Help needed turning from your back to your side while in a flat bed without using bedrails?: A Lot Help needed moving from lying on  your back to sitting on the side of a flat bed without using bedrails?: A Lot Help needed moving to and from a bed to a chair (including a wheelchair)?: Total Help needed standing up from a chair using your arms (e.g., wheelchair or bedside chair)?: Total Help needed to walk in hospital room?: Total Help needed climbing 3-5 steps with a railing? : Total 6 Click Score: 8    End of Session   Activity Tolerance: Patient limited by fatigue Patient left: in bed;with call bell/phone within reach;with nursing/sitter in room Nurse Communication: Mobility status PT Visit Diagnosis: Other abnormalities of gait and mobility (R26.89);Muscle weakness (generalized) (M62.81);Pain Pain - Right/Left: Left Pain - part of body: Arm     Time: 1749-4496 PT Time Calculation (min) (ACUTE ONLY): 27 min  Charges:  $Therapeutic Exercise: 8-22 mins $Therapeutic Activity: 8-22 mins                     Aida Raider, PT  Office # [phone] Pager [phone]    Ilda Foil 05/29/2020, 12:31 PM

## 2020-05-29 NOTE — Plan of Care (Signed)
  Problem: Skin Integrity: Goal: Risk for impaired skin integrity will decrease Outcome: Progressing   

## 2020-05-29 NOTE — Progress Notes (Signed)
  Speech Language Pathology Treatment: Dysphagia  Patient Details Name: [name] MRN: [id number] DOB: 1941/04/16 Today's Date: 05/29/2020 Time: 4174-0814 SLP Time Calculation (min) (ACUTE ONLY): 15 min  Assessment / Plan / Recommendation Clinical Impression  Pt was seen at bedside to assess tolerance of current (Dys2/thin liquid) diet and determine readiness to advance solid textures. Pt was sleeping upon arrival of SLP, and required verbal and tactile stim to rouse and maintain alertness. Pt declined trials of puree and soft solid, but did accept trials of thin liquid via straw. No overt s/s aspiration observed following thin liquid boluses. Will continue dys 2 diet at this time, and continue efforts to advance solid textures. Safe swallow precautions posted at [facility].    HPI HPI: Pt is a 79 y.o. female who presented from CIR due to AKI with increasing  lethargy and creatine levels despite IVF infusion. The pt's was reccently admissted 9/24 due to acute saddle embolism, development of LUE hematoma, and placement of IVC filter on 9/29. PMH includes: CVA in 02/2020, Takotsubo cardiomyopathy, DVT/PE, HTN, and obstructive sleep apnea. MRI was negative for acute changes and CXR was revealed left base atelectasis but was otherwise clear. Pt was receiving inpatient rehab SLP services for cognition but not swallowing. BSE on 02/17/20 revealed normal swallow function. Renee, RN reported on 10/14 that the pt was demonstrating pocketing      SLP Plan  Continue with current plan of care       Recommendations  Diet recommendations: Dysphagia 2 (fine chop);Thin liquid Liquids provided via: Cup;Straw Medication Administration: Whole meds with puree Supervision: Patient able to self feed;Full supervision/cueing for compensatory strategies Compensations: Slow rate;Small sips/bites;Follow solids with liquid Postural Changes and/or Swallow Maneuvers: Out of bed for meals;Seated upright 90 degrees;Upright  30-60 min after meal                Oral Care Recommendations: Oral care BID Follow up Recommendations: Inpatient Rehab SLP Visit Diagnosis: Dysphagia, oral phase (R13.11) Plan: Continue with current plan of care       GO               Shweta Aman B. Murvin Natal, [facility], CCC-SLP Speech Language Pathologist Office: [phone]  Leigh Aurora 05/29/2020, 11:37 AM

## 2020-05-30 ENCOUNTER — Inpatient Hospital Stay: Payer: Medicare Other

## 2020-05-30 ENCOUNTER — Other Ambulatory Visit (HOSPITAL_COMMUNITY): Payer: Self-pay | Admitting: Internal Medicine

## 2020-05-30 DIAGNOSIS — Z23 Encounter for immunization: Secondary | ICD-10-CM

## 2020-05-30 LAB — CBC
HCT: 28.6 % — ABNORMAL LOW (ref 36.0–46.0)
Hemoglobin: 8.8 g/dL — ABNORMAL LOW (ref 12.0–15.0)
MCH: 29.8 pg (ref 26.0–34.0)
MCHC: 30.8 g/dL (ref 30.0–36.0)
MCV: 96.9 fL (ref 80.0–100.0)
Platelets: 298 10*3/uL (ref 150–400)
RBC: 2.95 MIL/uL — ABNORMAL LOW (ref 3.87–5.11)
RDW: 14.6 % (ref 11.5–15.5)
WBC: 9.9 10*3/uL (ref 4.0–10.5)
nRBC: 0 % (ref 0.0–0.2)

## 2020-05-30 MED ORDER — APIXABAN 5 MG PO TABS
5.0000 mg | ORAL_TABLET | Freq: Two times a day (BID) | ORAL | 0 refills | Status: DC
Start: 2020-05-30 — End: 2020-08-07

## 2020-05-30 MED ORDER — IPRATROPIUM-ALBUTEROL 0.5-2.5 (3) MG/3ML IN SOLN: 3.0000 mL | RESPIRATORY_TRACT | 0 refills | Status: DC | PRN

## 2020-05-30 MED ORDER — HYDROXYZINE HCL 25 MG PO TABS: 25.0000 mg | ORAL_TABLET | Freq: Three times a day (TID) | ORAL | 0 refills | Status: DC | PRN

## 2020-05-30 MED ORDER — ENSURE ENLIVE PO LIQD: 237.0000 mL | Freq: Three times a day (TID) | ORAL | 12 refills | Status: DC

## 2020-05-30 MED ORDER — DRONABINOL 2.5 MG PO CAPS
2.5000 mg | ORAL_CAPSULE | Freq: Two times a day (BID) | ORAL | 0 refills | Status: DC
Start: 2020-05-30 — End: 2020-08-07

## 2020-05-30 MED ORDER — DOCUSATE SODIUM 100 MG PO CAPS
100.0000 mg | ORAL_CAPSULE | Freq: Two times a day (BID) | ORAL | 0 refills | Status: DC
Start: 2020-05-30 — End: 2020-08-05

## 2020-05-30 MED ORDER — DOCUSATE SODIUM 283 MG RE ENEM: 1.0000 | ENEMA | RECTAL | 0 refills | Status: DC | PRN

## 2020-05-30 MED ORDER — SENNOSIDES 8.6 MG PO TABS: 2.0000 | ORAL_TABLET | Freq: Two times a day (BID) | ORAL | 0 refills | Status: DC

## 2020-05-30 MED ORDER — HYOSCYAMINE SULFATE 0.125 MG SL SUBL: 0.1250 mg | SUBLINGUAL_TABLET | SUBLINGUAL | 0 refills | Status: DC | PRN

## 2020-05-30 MED ORDER — CALCIUM CARBONATE ANTACID 1250 MG/5ML PO SUSP: 500.0000 mg | Freq: Four times a day (QID) | ORAL | 0 refills | Status: DC | PRN

## 2020-05-30 MED ORDER — ZOLPIDEM TARTRATE 5 MG PO TABS: 5.0000 mg | ORAL_TABLET | Freq: Every evening | ORAL | 0 refills | Status: DC | PRN

## 2020-05-30 MED ORDER — LORAZEPAM 0.5 MG PO TABS: 0.5000 mg | ORAL_TABLET | ORAL | 0 refills | Status: DC | PRN

## 2020-05-30 MED ORDER — CAMPHOR-MENTHOL 0.5-0.5 % EX LOTN: 1.0000 "application " | TOPICAL_LOTION | Freq: Three times a day (TID) | CUTANEOUS | 0 refills | Status: DC | PRN

## 2020-05-30 MED ORDER — NEPRO/CARBSTEADY PO LIQD: 237.0000 mL | Freq: Three times a day (TID) | ORAL | 0 refills | Status: DC | PRN

## 2020-05-30 MED ORDER — ONDANSETRON HCL 4 MG PO TABS: 4.0000 mg | ORAL_TABLET | Freq: Four times a day (QID) | ORAL | 0 refills | Status: DC | PRN

## 2020-05-30 MED ORDER — MORPHINE SULFATE (CONCENTRATE) 20 MG/ML PO SOLN: 5.0000 mg | ORAL | 0 refills | Status: DC | PRN

## 2020-05-30 MED ORDER — PROCHLORPERAZINE MALEATE 10 MG PO TABS: 10.0000 mg | ORAL_TABLET | ORAL | 0 refills | Status: DC | PRN

## 2020-05-30 MED ORDER — OXYCODONE HCL 5 MG PO TABS: 5.0000 mg | ORAL_TABLET | ORAL | 0 refills | Status: DC | PRN

## 2020-05-30 MED FILL — ELIQUIS 5 MG TABLET: 5 | 30 days supply | Qty: 60 | Fill #0

## 2020-05-30 MED FILL — ENSURE PLUS LIQD strawberry: 7 days supply | Qty: 5688 | Fill #0

## 2020-05-30 MED FILL — ZOLPIDEM TARTRATE 5 MG TAB: 5 | 30 days supply | Qty: 30 | Fill #0

## 2020-05-30 MED FILL — DRONABINOL 2.5 MG CAPSULE: 2.5 | 30 days supply | Qty: 60 | Fill #0

## 2020-05-30 NOTE — Discharge Instructions (Signed)

## 2020-05-30 NOTE — Discharge Summary (Signed)
Physician Discharge Summary  [name] ZOX:[id number] DOB: February 04, 1941 DOA: 05/24/2020  PCP: [name], MD  Admit date: 05/24/2020 Discharge date: 05/30/2020  Recommendations for Outpatient Follow-up:  1. Pt is discharged to home with hospice 2. She remains a partial code. She is a DNI. 3. She will have a hospital bed, hoyer lift and over the bed table at home.  Discharge Diagnoses: Principal diagnosis is #1 1. Acute kidney disease.  2. 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 Anorexia 4. Hypertension 5. Recent history of submassive PE. IVC placed and heparin dc'd when pt developed Left upper extremity hematoma. As recommended anticoagulation was restarted 2 weeks after hematoma. She has now been converted from heparin back to Eliquis. 6. Left upper extremity hematoma 7. Acute blood loss anemia 8. Right hip cellulitis and E. Coli UTI. Patient has completed antibiotics. 9. History of Takotsubo Cardiomyopathy 10. Metabolic encephalopathy due to acute illness 11. Dementia/chronic anxiety  Discharge Condition: Fair  Disposition: Home with hospice  Diet recommendation: Regular. Encourage PO intake.  Filed Weights   05/25/20 1000 05/25/20 2057  Weight: 93 kg 93 kg    History of present illness:  [name] is a 79 y.o. 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  Evaluation subsequently revealed saddle PE with cor pulmonale and she was treated with tPA. She had LUE hematoma requiring transfusion, and anticoagulation was subsequently discontinued.  She had R hip cellulitis treated with Vanc and E coli UTI treated with Keflex while hospitalized.  IVC filter was placed 9/29.  While in rehab, she has become increasingly lethargic and is no longer actively participating in therapy.  She has had progressive renal failure despite IVF infusion.  As such, CIR requested  transfer back to the acute hospital.  At the time of my evaluation, the patient had just arrived from CIR to 38M.  She was pleasant and appeared to attempt to smile once but was overall very stoic.  She answered very limited questions.  She appears to deny pain.  Her DIL reports that she had passed out initially and they thinking it was a vagal response - but it was large DVT/PE.  She was doing fine until her left arm developed the hematoma and this caused terrible pain and she has been going downhill since then.  Following the CVA, her endurance wasn't that good, could only walk short distances.  The left eye issue is chronic and she wore a patch at night and has been putting medications in her eye.      Rehab Course:  Patient was admitted for acute saddle embolism and treated with tPA.  She developed L arm hematoma and had IVC filter placed on 9/29.  She was admitted to rehab and was participating but has been increasingly weak and withdrawn and so unable to participate in therapy.  Hospital Course: [name] a 79 y.o.femalewith medical history significant ofCVA (02/2020); Takotsubo cardiomyopathy; DVT/PE; HTN; and OSA who was admitted for syncope on 9/24. Evaluation subsequently revealed saddle PE with cor pulmonale and she was treated with tPA. She had LUE hematoma requiring transfusion, and anticoagulation was subsequently discontinued. She had R hip cellulitis treated with Vanc and E coli UTI treated with Keflex while hospitalized. IVC filter was placed 9/29. While in rehab, she has become increasingly lethargic and is no longer actively participating in therapy. She has had progressive renal failure despite IVF  infusion. As such, CIR requested transfer back to the acute hospital.  At the time of my evaluation, the patient had just arrived from CIR to 33M. She was pleasant and appeared to attempt to smile once but was overall very stoic. She answered very limited questions.  She appears to deny pain. Her DIL reports that she had passed out initially and they thinking it was a vagal response - but it was large DVT/PE. She was doing fine until her left arm developed the hematoma and this caused terrible pain and she has been going downhill since then. Following the CVA, her endurance wasn't that good, could only walk short distances. The left eye issue is chronic and she wore a patch at night and has been putting medications in her eye.   Prior to rehab the patient was admitted for acute saddle embolism and treated with tPA. She developed L arm hematoma and had IVC filter placed on 9/29. She was admitted to rehab and was participating but has been increasingly weak and withdrawn and so unable to participate in therapy.  The patient has been found to have AKI with an increase in creatinine from baseline of 1.0 in  to 2.69. Likely due to ATN from vancomycin and possibly poor PO intake. Monitor.  The patient has been evaluated by PT/OT. They have recommended SNF placement for the patient. However, the patient will go home with hospice. Family will have Hoyer lift, hospital bed and over the bed table delivered to them tomorrow. She will be discharged to home with hospice when all needs are arranged for. She continues to refuse PO nutrition.    Today's assessment: S: The patient is resting comfortably. No new complaints. O: Vitals:  Vitals:   05/30/20 0533 05/30/20 0940  BP: (!) 158/65 (!) 154/68  Pulse: (!) 51 60  Resp: 18 18  Temp: 98.8 F (37.1 C) 98.6 F (37 C)  SpO2: 97% 98%   Exam:  Constitutional:   The patient is lethargic. No acute distress. Respiratory:   No increased work of breathing.  No wheezes, rales, or rhonchi  No tactile fremitus Cardiovascular:   Regular rate and rhythm  No murmurs, ectopy, or gallups.  No lateral PMI. No thrills. Abdomen:   Abdomen is soft, non-tender, non-distended  No hernias, masses, or  organomegaly  Normoactive bowel sounds.  Musculoskeletal:   No cyanosis, clubbing, or edema Skin:   No rashes, lesions, ulcers  palpation of skin: no induration or nodules Neurologic:  She is moving all extremities. Responds verbally, but slowly. Pt is lethargic and confused. Discharge Instructions  Discharge Instructions    Call MD for:  severe uncontrolled pain   Complete by: As directed    Diet - low sodium heart healthy   Complete by: As directed    Discharge instructions   Complete by: As directed    Discharge home with hospice for end of life care.   Increase activity slowly   Complete by: As directed    No wound care   Complete by: As directed      Allergies as of 05/30/2020      Reactions   T-pa [alteplase] Swelling   Does okay with it if premedicated with benadryl.   Codeine Itching      Medication List    STOP taking these medications   amLODipine 10 MG tablet Commonly known as: NORVASC   clopidogrel 75 MG tablet Commonly known as: PLAVIX   lisinopril 5 MG tablet Commonly known  as: ZESTRIL     TAKE these medications   apixaban 5 MG Tabs tablet Commonly known as: ELIQUIS Take 1 tablet (5 mg total) by mouth 2 (two) times daily.   artificial tears Oint ophthalmic ointment Commonly known as: LACRILUBE Place into the left eye at bedtime. Apply left eye bedtime   aspirin 81 MG EC tablet Take 1 tablet (81 mg total) by mouth daily.   atorvastatin 20 MG tablet Commonly known as: LIPITOR Take 1 tablet (20 mg total) by mouth at bedtime.   busPIRone 30 MG tablet Commonly known as: BUSPAR Take 1 tablet (30 mg total) by mouth 2 (two) times daily. What changed: how much to take   calcium carbonate (dosed in mg elemental calcium) 1250 MG/5ML Susp Take 5 mLs (500 mg of elemental calcium total) by mouth every 6 (six) hours as needed for indigestion.   camphor-menthol lotion Commonly known as: SARNA Apply 1 application topically every 8 (eight) hours  as needed for itching.   cholecalciferol 25 MCG (1000 UNIT) tablet Commonly known as: VITAMIN D3 Take 1 tablet (1,000 Units total) by mouth daily.   docusate sodium 100 MG capsule Commonly known as: COLACE Take 1 capsule (100 mg total) by mouth 2 (two) times daily.   docusate sodium 283 MG enema Commonly known as: ENEMEEZ Place 1 enema (283 mg total) rectally as needed for severe constipation.   donepezil 10 MG tablet Commonly known as: ARICEPT Take 1 tablet (10 mg total) by mouth at bedtime.   dronabinol 2.5 MG capsule Commonly known as: MARINOL Take 1 capsule (2.5 mg total) by mouth 2 (two) times daily before lunch and supper.   feeding supplement Liqd Take 237 mLs by mouth 3 (three) times daily between meals.   feeding supplement (NEPRO CARB STEADY) Liqd Take 237 mLs by mouth 3 (three) times daily as needed (Supplement).   fexofenadine 180 MG tablet Commonly known as: ALLEGRA Take 180 mg by mouth daily.   hydrOXYzine 25 MG tablet Commonly known as: ATARAX/VISTARIL Take 1 tablet (25 mg total) by mouth every 8 (eight) hours as needed for itching.   hyoscyamine 0.125 MG SL tablet Commonly known as: LEVSIN SL Place 1 tablet (0.125 mg total) under the tongue every 4 (four) hours as needed (excess oral secretions).   ipratropium-albuterol 0.5-2.5 (3) MG/3ML Soln Commonly known as: DUONEB Take 3 mLs by nebulization every 4 (four) hours as needed (shortness of breath.).   Linzess 145 MCG Caps capsule Generic drug: linaclotide Take 145 mcg by mouth daily.   LORazepam 0.5 MG tablet Commonly known as: ATIVAN Take 1 tablet (0.5 mg total) by mouth every 4 (four) hours as needed for anxiety. May crush, mix with water and give sublingually if needed.   melatonin 3 MG Tabs tablet Take 1 tablet (3 mg total) by mouth at bedtime.   memantine 28 MG Cp24 24 hr capsule Commonly known as: NAMENDA XR Take 1 capsule (28 mg total) by mouth daily. What changed: when to take this     mirtazapine 15 MG tablet Commonly known as: REMERON Take 15 mg by mouth at bedtime as needed (for sleep).   morphine 20 MG/ML concentrated solution Commonly known as: ROXANOL Take 0.25 mLs (5 mg total) by mouth every 4 (four) hours as needed for severe pain or shortness of breath. May give sublingually if needed.   MULTI-VITAMIN GUMMIES PO Take 1 each by mouth daily.   nitroGLYCERIN 0.4 MG SL tablet Commonly known as: NITROSTAT Place 1 tablet (  0.4 mg total) under the tongue every 5 (five) minutes as needed for chest pain. What changed: reasons to take this   ondansetron 4 MG tablet Commonly known as: ZOFRAN Take 1 tablet (4 mg total) by mouth every 6 (six) hours as needed for nausea.   oxyCODONE 15 MG immediate release tablet Commonly known as: ROXICODONE Take 1 tablet (15 mg total) by mouth 4 (four) times daily as needed (chronic pain). What changed:   how much to take  when to take this  additional instructions   oxyCODONE 5 MG immediate release tablet Commonly known as: Oxy IR/ROXICODONE Take 1 tablet (5 mg total) by mouth every 4 (four) hours as needed for severe pain. May crush, mix with water and give sublingually if needed. What changed: You were already taking a medication with the same name, and this prescription was added. Make sure you understand how and when to take each.   polyethylene glycol 17 g packet Commonly known as: MIRALAX / GLYCOLAX Take 17 g by mouth 2 (two) times daily.   polyvinyl alcohol 1.4 % ophthalmic solution Commonly known as: LIQUIFILM TEARS Place 2 drops into the left eye 4 (four) times daily.   prochlorperazine 10 MG tablet Commonly known as: COMPAZINE Take 1 tablet (10 mg total) by mouth every 4 (four) hours as needed for nausea or vomiting. May crush, mix with water and give sublingually.   Restasis 0.05 % ophthalmic emulsion Generic drug: cycloSPORINE Place 1 drop into both eyes daily as needed (dry eyes).   senna 8.6 MG  tablet Commonly known as: Senokot Take 2 tablets (17.2 mg total) by mouth 2 (two) times daily. May crush, mix with water and give sublingually if needed.   senna-docusate 8.6-50 MG tablet Commonly known as: Senokot-S Take 1 tablet by mouth at bedtime.   zinc gluconate 50 MG tablet Take 25 mg by mouth daily.   zolpidem 5 MG tablet Commonly known as: AMBIEN Take 1 tablet (5 mg total) by mouth at bedtime as needed for sleep (Insomnia).            Durable Medical Equipment  (From admission, onward)         Start     Ordered   05/29/20 1435  For home use only DME Other see comment  Once       Comments: Michiel Sites Lift  Question:  Length of Need  Answer:  Lifetime   05/29/20 1435   05/29/20 1434  For home use only DME Overbed table  Once        05/29/20 1433   05/29/20 1432  For home use only DME Hospital bed  Once       Question Answer Comment  Length of Need Lifetime   The above medical condition requires: Patient requires the ability to reposition frequently   Bed type Semi-electric      05/29/20 1433         Allergies  Allergen Reactions  . T-Pa [Alteplase] Swelling    Does okay with it if premedicated with benadryl.  . Codeine Itching    The results of significant diagnostics from this hospitalization (including imaging, microbiology, ancillary and laboratory) are listed below for reference.    Significant Diagnostic Studies: CT ABDOMEN PELVIS WO CONTRAST  Result Date: 05/09/2020 CLINICAL DATA:  Abdominal distension and lower back pain. EXAM: CT ABDOMEN AND PELVIS WITHOUT CONTRAST TECHNIQUE: Multidetector CT imaging of the abdomen and pelvis was performed following the standard protocol without IV contrast. COMPARISON:  None. FINDINGS: Lower chest: Very mild atelectasis is seen within the posterior aspects of the bilateral lung bases. Hepatobiliary: No focal liver abnormality is seen. The gallbladder is moderately distended. No gallstones, gallbladder wall thickening,  or biliary dilatation. Pancreas: Unremarkable. No pancreatic ductal dilatation or surrounding inflammatory changes. Spleen: Normal in size without focal abnormality. Adrenals/Urinary Tract: Adrenal glands are unremarkable. Kidneys are normal, without renal calculi, focal lesion, or hydronephrosis. Bladder is unremarkable. Stomach/Bowel: Stomach is within normal limits. Appendix appears normal. No evidence of bowel wall thickening, distention, or inflammatory changes. Noninflamed diverticula are seen within the sigmoid colon. Vascular/Lymphatic: There is moderate severity calcification and tortuosity of the abdominal aorta and bilateral common iliac arteries, without evidence of aneurysmal dilatation. No enlarged abdominal or pelvic lymph nodes. Reproductive: Uterus and bilateral adnexa are unremarkable. Other: No abdominal wall hernia or abnormality. No abdominopelvic ascites. Musculoskeletal: A 5.8 cm x 6.2 cm area (incompletely imaged) of intramuscular low attenuation is seen along the medial aspect of the left upper extremity. This is adjacent to the mid and distal portions of the left humeral shaft (axial CT images 81 through 18, CT series number 3). A moderate amount of adjacent deep subcutaneous inflammatory fat stranding is seen. Moderate severity subcutaneous inflammatory fat stranding is seen along the lateral aspect of the right hip and right hemipelvis. There is marked severity levoscoliosis of the lumbar spine. Bilateral metallic density pedicle screws are seen from the levels of L2 through S1. An extensive amount of associated streak artifact is seen. Subsequently limited evaluation of the contents of the abdomen and pelvis is noted. IMPRESSION: 1. Large area of intramuscular low attenuation along the medial aspect of the left upper extremity which may represent an evolving hematoma. An infectious or neoplastic process cannot be excluded. MRI correlation is recommended. 2. Moderate severity cellulitis  along the lateral aspect of the right hip and right hemipelvis. 3. Sigmoid diverticulosis without evidence of diverticulitis. 4. 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 5. Aortic atherosclerosis. Aortic Atherosclerosis (ICD10-I70.0). Electronically Signed   By: Aram Candela M.D.   On: 05/09/2020 16:16   DG Chest 2 View  Result Date: 05/23/2020 CLINICAL DATA:  Leukocytosis EXAM: CHEST - 2 VIEW COMPARISON:  May 05, 2020 FINDINGS: There is atelectatic change in the left base. The lungs elsewhere are clear. Heart is upper normal in size with pulmonary vascularity within normal limits. There is aortic atherosclerosis. Loop recorder present on the left anteriorly. There is postoperative change in the lumbar spine. Bones are osteoporotic. IMPRESSION: Left base atelectasis. Lungs otherwise clear. Heart upper normal in size. No appreciable adenopathy. Aortic Atherosclerosis (ICD10-I70.0). Electronically Signed   By: Bretta Bang III M.D.   On: 05/23/2020 13:30   CT Head Wo Contrast  Result Date: 05/05/2020 CLINICAL DATA:  Mental status change.  Somnolent. EXAM: CT HEAD WITHOUT CONTRAST TECHNIQUE: Contiguous axial images were obtained from the base of the skull through the vertex without intravenous contrast. COMPARISON:  Head CT and brain MRI 02/17/2020 FINDINGS: Brain: No intracranial hemorrhage, mass effect, or midline shift. 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 No hydrocephalus. The basilar cisterns are patent. Small left superior cerebellar infarcts unchanged from prior. 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 The right pontine infarct on prior is not well visualized. Unchanged chronic small vessel ischemia. No evidence of territorial infarct or acute ischemia. No extra-axial or intracranial fluid collection. Vascular: No hyperdense vessel. Skull: No  fracture or focal  lesion.  Bifrontal hyperostosis. Sinuses/Orbits: No acute findings. Other: None. IMPRESSION: 1. No acute intracranial abnormality. 2. Stable chronic small vessel ischemia, remote left cerebellar infarct. Pontine infarcts that were acute on July exam demonstrate minimal encephalomalacia on the left, and is not seen on the right. Electronically Signed   By: Narda Rutherford M.D.   On: 05/05/2020 21:04   CT Angio Chest PE W and/or Wo Contrast  Result Date: 05/05/2020 CLINICAL DATA:  Suspected pulmonary embolus with low to intermediate probability. Positive D-dimer. Near syncope. EXAM: CT ANGIOGRAPHY CHEST WITH CONTRAST TECHNIQUE: Multidetector CT imaging of the chest was performed using the standard protocol during bolus administration of intravenous contrast. Multiplanar CT image reconstructions and MIPs were obtained to evaluate the vascular anatomy. CONTRAST:  19mL OMNIPAQUE IOHEXOL 350 MG/ML SOLN COMPARISON:  Chest CT 08/26/2018.  Chest radiograph 05/05/2020 FINDINGS: Cardiovascular: Good opacification of the central and segmental pulmonary arteries. Large filling defects are demonstrated in the central, lobar, and segmental pulmonary arteries bilaterally consistent with acute pulmonary embolus including saddle embolus and large clot burden. The RV to LV ratio is elevated at 2.8 and there is significant reflux of contrast material into the hepatic veins, suggesting right heart strain. No pericardial effusions. Calcification of the aorta. Ascending aorta measures 4.2 cm diameter. Mediastinum/Nodes: No significant lymphadenopathy. Esophagus is decompressed. Lungs/Pleura: 3 mm nodule in the right mid lung likely calcified. Mild atelectasis in the lung bases. No focal consolidation. No pleural effusions. No pneumothorax. Upper Abdomen: No acute abnormalities demonstrated in the visualized upper abdomen. Musculoskeletal: Lumbar scoliosis with postoperative change. Degenerative changes. Anterior compression of T11  and T12, chronic. No destructive bone lesions. Review of the MIP images confirms the above findings. IMPRESSION: 1. Large bilateral acute pulmonary embolus including saddle embolus and large clot burden. CT evidence of right heart strain (RV/LV Ratio = 2.8) consistent with at least submassive (intermediate risk) PE. The presence of right heart strain has been associated with an increased risk of morbidity and mortality. 2. Mild atelectasis in the lung bases. 3. Aortic atherosclerosis. Aortic Atherosclerosis (ICD10-I70.0). Critical Value/emergent results were called by telephone at the time of interpretation on 05/05/2020 at 9:05 pm to provider PA [place], who verbally acknowledged these results. Electronically Signed   By: Burman Nieves M.D.   On: 05/05/2020 21:09   MR ANGIO HEAD WO CONTRAST  Result Date: 05/24/2020 CLINICAL DATA:  Acute neurologic deficit. EXAM: MRI HEAD WITHOUT CONTRAST MRA HEAD WITHOUT CONTRAST TECHNIQUE: Multiplanar, multiecho pulse sequences of the brain and surrounding structures were obtained without intravenous contrast. Angiographic images of the head were obtained using MRA technique without contrast. COMPARISON:  None. FINDINGS: MRI HEAD FINDINGS Brain: No acute infarct, acute hemorrhage or extra-axial collection. Multifocal hyperintense T2-weighted signal within the white matter. Normal volume of CSF spaces. Chronic microhemorrhage within the left parietal lobe, left cerebellum and the pons. Normal midline structures. Vascular: Normal flow voids. Skull and upper cervical spine: Normal marrow signal. Sinuses/Orbits: Negative. Other: None. MRA HEAD FINDINGS POSTERIOR CIRCULATION: --Vertebral arteries: Normal V4 segments. --Inferior cerebellar arteries: Normal. --Basilar artery: Normal. --Superior cerebellar arteries: Normal. --Posterior cerebral arteries: Normal. The right PCA is partially supplied by a posterior communicating artery (p-comm). 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 The left is normal. --Anterior cerebral arteries (ACA): Normal. Both A1 segments are present. Patent anterior communicating artery (a-comm). --Middle cerebral  arteries (MCA): Normal. IMPRESSION: 1. No acute intracranial abnormality. 2. 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 3. 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 Electronically Signed   By: Deatra Robinson M.D.   On: 05/24/2020 19:59   MR BRAIN WO CONTRAST  Result Date: 05/24/2020 CLINICAL DATA:  Acute neurologic deficit. EXAM: MRI HEAD WITHOUT CONTRAST MRA HEAD WITHOUT CONTRAST TECHNIQUE: Multiplanar, multiecho pulse sequences of the brain and surrounding structures were obtained without intravenous contrast. Angiographic images of the head were obtained using MRA technique without contrast. COMPARISON:  None. FINDINGS: MRI HEAD FINDINGS Brain: No acute infarct, acute hemorrhage or extra-axial collection. Multifocal hyperintense T2-weighted signal within the white matter. Normal volume of CSF spaces. Chronic microhemorrhage within the left parietal lobe, left cerebellum and the pons. Normal midline structures. Vascular: Normal flow voids. Skull and upper cervical spine: Normal marrow signal. Sinuses/Orbits: Negative. Other: None. MRA HEAD FINDINGS POSTERIOR CIRCULATION: --Vertebral arteries: Normal V4 segments. --Inferior cerebellar arteries: Normal. --Basilar artery: Normal. --Superior cerebellar arteries: Normal. --Posterior cerebral arteries: Normal. The right PCA is partially supplied by a posterior communicating artery (p-comm). ANTERIOR CIRCULATION: --Intracranial internal carotid arteries: There is a 4 x 3 mm inferior  and medially projecting aneurysm arising from the supraclinoid segment of the right internal carotid artery. The left is normal. --Anterior cerebral arteries (ACA): Normal. Both A1 segments are present. Patent anterior communicating artery (a-comm). --Middle cerebral arteries (MCA): Normal. IMPRESSION: 1. No acute intracranial abnormality. 2. 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 3. 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 Electronically Signed   By: Deatra Robinson M.D.   On: 05/24/2020 19:59   IR IVC FILTER PLMT / S&I Lenise Arena GUID/MOD SED  Result Date: 05/10/2020 CLINICAL DATA:  79 year old female with history of DVT, PE, and large left upper extremity hematoma. EXAM: 1. ULTRASOUND GUIDANCE FOR VASCULAR ACCESS OF THE RIGHT internal jugular VEIN. 2. IVC VENOGRAM. 3. PERCUTANEOUS IVC FILTER PLACEMENT. ANESTHESIA/SEDATION: 1 mg IV Versed; 75 mcg IV Fentanyl. Total Moderate Sedation Time Thirteenminutes. CONTRAST:  85mL OMNIPAQUE IOHEXOL 300 MG/ML  SOLN FLUOROSCOPY TIME:  4 minutes, 24 seconds, 134 mGy PROCEDURE: The procedure, risks, benefits, and alternatives were explained to the patient. Questions regarding the procedure were encouraged and answered. The patient understands and consents to the procedure. The patient was prepped with Betadine in a sterile fashion, and a sterile drape was applied covering the operative field. A sterile gown and sterile gloves were used for the procedure. Local anesthesia was provided with 1% Lidocaine. Under direct ultrasound guidance, a 21 gauge needle was advanced into the right internal jugular vein with ultrasound image documentation performed. After securing access with a micropuncture dilator, a guidewire was advanced into the inferior vena cava. A deployment sheath was advanced over the guidewire. This was  utilized to perform IVC venography. The deployment sheath was further positioned in an appropriate location for filter deployment. A Denali IVC filter was then advanced in the sheath. This was then fully deployed in the infrarenal IVC. Final filter position was confirmed with a fluoroscopic spot image. Contrast injection was also performed through the sheath under fluoroscopy to confirm patency of the IVC at the level of the filter. The apex of the filter with was against the left lateral wall,  just inferior to the left renal vein. Therefore the indwelling filter deployment sheath was exchanged for an 11 French coaxial retrieval sheath system, through which a loop snare was used to capture the apex hook and reposition the filter to a more and line position with the infrarenal segment of the inferior vena cava. Repeat contrast injection through the indwelling sheath demonstrated better positioning of the filter. After the procedure the sheath was removed and hemostasis obtained with manual compression. COMPLICATIONS: None. FINDINGS: IVC venography demonstrates a normal caliber IVC with no evidence of thrombus. Renal veins are identified bilaterally. The IVC filter was successfully positioned below the level of the renal veins and is appropriately oriented. This IVC filter has both permanent and retrievable indications. IMPRESSION: Placement of percutaneous IVC filter in infrarenal IVC. IVC venogram shows no evidence of IVC thrombus and normal caliber of the inferior vena cava. This filter does have both permanent and retrievable indications. PLAN: This IVC filter is potentially retrievable. The patient will be assessed for filter retrieval by Interventional Radiology in approximately 8-12 weeks. Further recommendations regarding filter retrieval, continued surveillance or declaration of device permanence, will be made at that time. Marliss Coots, MD [facility]  Radiology Electronically Signed   By: Marliss Coots MD   On: 05/10/2020 13:09   US RENAL  Result Date: 05/22/2020 CLINICAL DATA:  Acute kidney injury EXAM: RENAL / URINARY TRACT ULTRASOUND COMPLETE COMPARISON:  CT abdomen pelvis 05/09/2020 FINDINGS: Right Kidney: Renal measurements: 9.4 x 4.4 x 5.1 cm = volume: 110.2 mL. Echogenicity is within normal limits. No concerning renal mass, shadowing calculus or hydronephrosis. Left Kidney: Renal measurements: 7.1 x 6.2 x 5.1 cm = volume: 167.4 mL. Echogenicity is within normal limits. No concerning renal mass, shadowing calculus or hydronephrosis. Bladder: Bladder is suboptimally distended at the time of examination limiting sonographic evaluation. No gross abnormality though overall poorly assessed. Other: Technically challenging examination due to extensive bowel gas and inability to perform breath hold or reposition. IMPRESSION: 1. Technically challenging examination due to extensive bowel gas and inability to perform breath hold or reposition. 2. Grossly unremarkable appearance of the kidneys. 3. Bladder suboptimally distended at the time of examination without gross abnormality though overall quite poorly assessed. Electronically Signed   By: Kreg Shropshire M.D.   On: 05/22/2020 23:52   DG Chest Portable 1 View  Result Date: 05/05/2020 CLINICAL DATA:  Near syncope EXAM: PORTABLE CHEST 1 VIEW COMPARISON:  November 07, 2019 FINDINGS: The heart size is enlarged. Aortic calcifications are noted. The pulmonary arteries appear to be dilated which can be seen in patients with elevated pulmonary artery pressures. There is no pneumothorax or large pleural effusion. There is a device projecting over the patient's left ventricle which may represent a loop recorder or lead less pacemaker. There is no acute displaced fracture. There are degenerative changes of both glenohumeral joints. IMPRESSION: No active disease. Electronically Signed   By: Katherine Mantle M.D.   On:  05/05/2020 16:56   ECHOCARDIOGRAM COMPLETE  Result Date: 05/06/2020    ECHOCARDIOGRAM REPORT   Patient Name:   [name] Date of Exam: 05/06/2020 Medical Rec #:  [id number]        Height:       62.0 in Accession #:    [id number]       Weight:       183.6 lb Date of Birth:  [birth year]       BSA:  1.844 m Patient Age:    78 years         BP:           132/82 mmHg Patient Gender: F                HR:           59 bpm. 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 CAD; Risk Factors:Sleep Apnea, Hypertension and                 Dyslipidemia.  Sonographer:    Irving Burton Senior RDCS Referring Phys: [id number] Aliene Beams  Sonographer Comments: Technically difficult due to poor echo windows. IMPRESSIONS  1. Left ventricular ejection fraction, by estimation, is 60 to 65%. The left ventricle has normal function. The left ventricle has no regional wall motion abnormalities. There is mild left ventricular hypertrophy. Left ventricular diastolic parameters are consistent with Grade I diastolic dysfunction (impaired relaxation).  2. Findings suggest mild RV dysfunction.. Right ventricular systolic function is mildly reduced. The right ventricular size is mildly enlarged. There is mildly elevated pulmonary artery systolic pressure. The estimated right ventricular systolic pressure is 40.7 mmHg.  3. Left atrial size was mildly dilated.  4. The mitral valve is abnormal. Trivial mitral valve regurgitation.  5. Tricuspid valve regurgitation is mild to moderate.  6. The aortic valve is tricuspid. Aortic valve regurgitation is mild. Mild aortic valve sclerosis is present, with no evidence of aortic valve stenosis.  7. The inferior vena cava is normal in size with greater than 50% respiratory variability, suggesting right atrial  pressure of 3 mmHg. FINDINGS  Left Ventricle: Left ventricular ejection fraction, by estimation, is 60 to 65%. The left ventricle has normal function. The left ventricle has no regional wall motion abnormalities. The left ventricular internal cavity size was normal in size. There is  mild left ventricular hypertrophy. Left ventricular diastolic parameters are consistent with Grade I diastolic dysfunction (impaired relaxation). Normal left ventricular filling pressure. Right Ventricle: Findings suggest mild RV dysfunction. The right ventricular size is mildly enlarged. No increase in right ventricular wall thickness. Right ventricular systolic function is mildly reduced. There is mildly elevated pulmonary artery systolic pressure. The tricuspid regurgitant velocity is 3.07 m/s, and with an assumed right atrial pressure of 3 mmHg, the estimated right ventricular systolic pressure is 40.7 mmHg. Left Atrium: Left atrial size was mildly dilated. Right Atrium: Right atrial size was normal in size. Pericardium: There is no evidence of pericardial effusion. Mitral Valve: The mitral valve is abnormal. There is mild thickening of the mitral valve leaflet(s). Trivial mitral valve regurgitation. Tricuspid Valve: The tricuspid valve is grossly normal. Tricuspid valve regurgitation is mild to moderate. Aortic Valve: The aortic valve is tricuspid. Aortic valve regurgitation is mild. Aortic regurgitation PHT measures 688 msec. Mild aortic valve sclerosis is present, with no evidence of aortic valve stenosis. Pulmonic Valve: The pulmonic valve was grossly normal. Pulmonic valve regurgitation is trivial. Aorta: The aortic root and ascending aorta are structurally normal, with no evidence of dilitation. Venous: The inferior vena cava is normal in size with greater than 50% respiratory variability, suggesting right atrial pressure of 3 mmHg. IAS/Shunts: No atrial level shunt detected by color flow  Doppler.  LEFT VENTRICLE PLAX 2D  LVIDd:         3.70 cm  Diastology LVIDs:         2.40 cm  LV e' medial:    3.70 cm/s LV PW:         0.80 cm  LV E/e' medial:  13.3 LV IVS:        1.20 cm  LV e' lateral:   7.51 cm/s LVOT diam:     2.20 cm  LV E/e' lateral: 6.6 LV SV:         69 LV SV Index:   37 LVOT Area:     3.80 cm  RIGHT VENTRICLE RV S prime:     14.60 cm/s TAPSE (M-mode): 1.8 cm LEFT ATRIUM             Index       RIGHT ATRIUM           Index LA diam:        3.10 cm 1.68 cm/m  RA Area:     16.50 cm LA Vol (A2C):   40.0 ml 21.70 ml/m RA Volume:   36.00 ml  19.53 ml/m LA Vol (A4C):   79.7 ml 43.23 ml/m LA Biplane Vol: 57.0 ml 30.92 ml/m  AORTIC VALVE LVOT Vmax:   81.60 cm/s LVOT Vmean:  63.700 cm/s LVOT VTI:    0.181 m AI PHT:      688 msec  AORTA Ao Root diam: 3.60 cm Ao Asc diam:  3.40 cm MITRAL VALVE               TRICUSPID VALVE MV Area (PHT): 1.87 cm    TR Peak grad:   37.7 mmHg MV Decel Time: 405 msec    TR Vmax:        307.00 cm/s MV E velocity: 49.30 cm/s MV A velocity: 49.30 cm/s  SHUNTS MV E/A ratio:  1.00        Systemic VTI:  0.18 m                            Systemic Diam: 2.20 cm Zoila Shutter MD Electronically signed by Zoila Shutter MD Signature Date/Time: 05/06/2020/10:41:18 AM    Final    [place] LT UPPER EXTREM LTD SOFT TISSUE NON VASCULAR  Result Date: 05/07/2020 CLINICAL DATA:  Bruising and swelling in the medial left upper arm and forearm. EXAM: ULTRASOUND left UPPER EXTREMITY LIMITED TECHNIQUE: Ultrasound examination of the upper extremity soft tissues was performed in the area of clinical concern. COMPARISON:  None. FINDINGS: In the left upper arm there is a elongated 23.5 x 5.7 x 5.0 cm fluid collection all along the entire length of the upper arm. Some internal echogenic debris but enhanced through transmission and no blood flow. The forearm area demonstrates edematous soft tissues but no discrete fluid collection. IMPRESSION: 1. Large elongated subcutaneous fluid collection involving the left upper extremity.  This could be hematoma or abscess. 2. Diffuse inflammatory/edematous changes in the forearm area without discrete fluid collection. Electronically Signed   By: Rudie Meyer M.D.   On: 05/07/2020 13:38   CT HEAD CODE STROKE WO CONTRAST`  Result Date: 05/14/2020 CLINICAL DATA:  Code stroke. Delirium. Assess for stroke. Lethargy and slurred speech. EXAM: CT HEAD WITHOUT CONTRAST TECHNIQUE: Contiguous axial images were obtained from the base of the skull through the vertex without intravenous contrast. COMPARISON:  CT head May 05, 2020  FINDINGS: Brain: No acute intracranial hemorrhage, mass effect, or midline shift. Similar patchy white matter hypoattenuation, likely secondary to chronic microvascular ischemic disease. No evidence of acute large vascular territory infarct. 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 No hydrocephalus. No mass effect. No mass lesion. Vascular: Calcific atherosclerosis. Skull: Normal. Negative for fracture or focal lesion. Sinuses/Orbits: No acute finding. Other: No mastoid effusions. ASPECTS [facility] Stroke Program Early CT Score) Total score (0-10 with 10 being normal): 10 IMPRESSION: 1. No evidence of acute intracranial abnormality. ASPECTS is 10 2. 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 3. Similar chronic microvascular ischemic disease. Code stroke imaging results were communicated on 05/14/2020 at 1:17 pm to provider Dr. Thomasena Edis via telephone, who verbally acknowledged these results. Electronically Signed   By: Feliberto Harts MD   On: 05/14/2020 13:21   VAS [place] LOWER EXTREMITY VENOUS (DVT)  Result Date: 05/07/2020  Lower Venous DVT Study Indications: Pulmonary embolism, and Edema.  Limitations: Patient's inability to rotate leg. Comparison Study: Prior study done 02/20/20 and is available for comparison Performing Technologist: Sherren Kerns RVS  Examination Guidelines: A complete evaluation includes B-mode imaging, spectral  Doppler, color Doppler, and power Doppler as needed of all accessible portions of each vessel. Bilateral testing is considered an integral part of a complete examination. Limited examinations for reoccurring indications may be performed as noted. The reflux portion of the exam is performed with the patient in reverse Trendelenburg.  +---------+---------------+---------+-----------+----------+--------------+ RIGHT    CompressibilityPhasicitySpontaneityPropertiesThrombus Aging +---------+---------------+---------+-----------+----------+--------------+ CFV      Full           Yes      Yes                                 +---------+---------------+---------+-----------+----------+--------------+ SFJ      Full                                                        +---------+---------------+---------+-----------+----------+--------------+ FV Prox  Full                                                        +---------+---------------+---------+-----------+----------+--------------+ FV Mid   Full                                                        +---------+---------------+---------+-----------+----------+--------------+ FV DistalFull                                                        +---------+---------------+---------+-----------+----------+--------------+ PFV      Full                                                        +---------+---------------+---------+-----------+----------+--------------+  POP      None           No       No                   Acute          +---------+---------------+---------+-----------+----------+--------------+ PTV      None           No       No                   Acute          +---------+---------------+---------+-----------+----------+--------------+ PERO     None           No       No                   Acute          +---------+---------------+---------+-----------+----------+--------------+    +---------+---------------+---------+-----------+----------+-------------------+ LEFT     CompressibilityPhasicitySpontaneityPropertiesThrombus Aging      +---------+---------------+---------+-----------+----------+-------------------+ CFV      Full           Yes      Yes                                      +---------+---------------+---------+-----------+----------+-------------------+ SFJ      Full                                                             +---------+---------------+---------+-----------+----------+-------------------+ FV Prox  Full                                                             +---------+---------------+---------+-----------+----------+-------------------+ FV Mid   Full                                                             +---------+---------------+---------+-----------+----------+-------------------+ FV DistalFull                                                             +---------+---------------+---------+-----------+----------+-------------------+ PFV      Full                                                             +---------+---------------+---------+-----------+----------+-------------------+ POP                     Yes      Yes  patent by color and                                                       Doppler             +---------+---------------+---------+-----------+----------+-------------------+ PTV      None                                         Acute               +---------+---------------+---------+-----------+----------+-------------------+ PERO     None                                         Acute               +---------+---------------+---------+-----------+----------+-------------------+     Summary: RIGHT: - Findings consistent with acute deep vein thrombosis involving the right popliteal vein, right posterior tibial veins, and right peroneal veins. -  A cystic structure is found in the popliteal fossa.  LEFT: - Findings consistent with acute deep vein thrombosis involving the left posterior tibial veins, and left peroneal veins.  *See table(s) above for measurements and observations. Electronically signed by Waverly Ferrari MD on 05/07/2020 at 7:12:53 AM.    Final    VAS [place] UPPER EXTREMITY VENOUS DUPLEX  Result Date: 05/21/2020 UPPER VENOUS STUDY  Indications: Swelling, and Pain Limitations: Patient position, restricted mobility. Comparison Study: No prior study Performing Technologist: Gertie Fey MHA, RDMS, RVT, RDCS  Examination Guidelines: A complete evaluation includes B-mode imaging, spectral Doppler, color Doppler, and power Doppler as needed of all accessible portions of each vessel. Bilateral testing is considered an integral part of a complete examination. Limited examinations for reoccurring indications may be performed as noted.  Left Findings: +----------+------------+---------+-----------+----------+--------------+ LEFT      CompressiblePhasicitySpontaneousProperties   Summary     +----------+------------+---------+-----------+----------+--------------+ IJV                                                 Not visualized +----------+------------+---------+-----------+----------+--------------+ Subclavian    Full       Yes       Yes                             +----------+------------+---------+-----------+----------+--------------+ Axillary                                            Not visualized +----------+------------+---------+-----------+----------+--------------+ Brachial      Full       Yes       Yes                             +----------+------------+---------+-----------+----------+--------------+ Radial        Full                                                 +----------+------------+---------+-----------+----------+--------------+  Ulnar                                                Not visualized +----------+------------+---------+-----------+----------+--------------+ Cephalic      Full                                                 +----------+------------+---------+-----------+----------+--------------+ Basilic                                             Not visualized +----------+------------+---------+-----------+----------+--------------+  Summary:  Left: No obvious evidence of deep or superficial vein thrombosis involving the visualized veins of the left upper extremity. There is a mostly cystic area of the left medial mid upper arm measuring at least 5.2cm; suggestive of possible hematoma versus seroma versus unknown etiology.  *See table(s) above for measurements and observations.  Diagnosing physician: Lemar Livings MD Electronically signed by Lemar Livings MD on 05/21/2020 at 11:33:08 AM.    Final    CT ANGIO HEAD CODE STROKE  Result Date: 05/14/2020 CLINICAL DATA:  Stroke follow-up. Altered mental status. Known pulmonary embolus. EXAM: CT ANGIOGRAPHY HEAD AND NECK TECHNIQUE: Multidetector CT imaging of the head and neck was performed using the standard protocol during bolus administration of intravenous contrast. Multiplanar CT image reconstructions and MIPs were obtained to evaluate the vascular anatomy. Carotid stenosis measurements (when applicable) are obtained utilizing NASCET criteria, using the distal internal carotid diameter as the denominator. CONTRAST:  75mL OMNIPAQUE IOHEXOL 350 MG/ML SOLN COMPARISON:  CTA 02/16/2020 FINDINGS: CTA NECK FINDINGS Aortic arch: Aortic atherosclerosis. Great vessel origins are patent. Right carotid system: Calcified plaque at the bifurcation without significant stenosis. Mild irregularity of the internal carotid artery at the skull base, similar to prior. Left carotid system: Calcified plaque at the bifurcation without significant stenosis. Mild irregularity of the internal carotid artery at the skull base, similar  to prior. Vertebral arteries: Mildly left dominant. Mild calcific atherosclerosis at the right origin. No evidence of significant stenosis or occlusion. Skeleton: Degenerative changes of the cervical spine. Other neck: No mass or adenopathy. Upper chest: Large bilateral acute pulmonary embolus, better characterized on recent CTA chest Review of the MIP images confirms the above findings CTA HEAD FINDINGS Anterior circulation: No significant stenosis, proximal occlusion, aneurysm, or vascular malformation. Posterior circulation: The basilar artery is patent. Bilateral posterior cerebral arteries are patent and appear similar to prior. Tortuous basilar artery. Left fetal type PCA. No evidence of significant (greater than 50%) stenosis or occlusion. Similar appearance relative to prior. Venous sinuses: As permitted by contrast timing, patent. Review of the MIP images confirms the above findings IMPRESSION: 1. No evidence of large vessel occlusion or hemodynamically significant stenosis. 2. Large bilateral acute pulmonary embolus, better characterized on recent CTA chest Code stroke imaging results were communicated on 05/14/2020 at 1:42 pm to provider Dr. Thomasena Edis via telephone, who verbally acknowledged these results. Electronically Signed   By: Feliberto Harts MD   On: 05/14/2020 13:43   CT ANGIO NECK CODE STROKE  Result Date: 05/14/2020 CLINICAL DATA:  Stroke follow-up. Altered mental status. Known pulmonary embolus. EXAM: CT ANGIOGRAPHY  HEAD AND NECK TECHNIQUE: Multidetector CT imaging of the head and neck was performed using the standard protocol during bolus administration of intravenous contrast. Multiplanar CT image reconstructions and MIPs were obtained to evaluate the vascular anatomy. Carotid stenosis measurements (when applicable) are obtained utilizing NASCET criteria, using the distal internal carotid diameter as the denominator. CONTRAST:  12mL OMNIPAQUE IOHEXOL 350 MG/ML SOLN COMPARISON:  CTA  02/16/2020 FINDINGS: CTA NECK FINDINGS Aortic arch: Aortic atherosclerosis. Great vessel origins are patent. Right carotid system: Calcified plaque at the bifurcation without significant stenosis. Mild irregularity of the internal carotid artery at the skull base, similar to prior. Left carotid system: Calcified plaque at the bifurcation without significant stenosis. Mild irregularity of the internal carotid artery at the skull base, similar to prior. Vertebral arteries: Mildly left dominant. Mild calcific atherosclerosis at the right origin. No evidence of significant stenosis or occlusion. Skeleton: Degenerative changes of the cervical spine. Other neck: No mass or adenopathy. Upper chest: Large bilateral acute pulmonary embolus, better characterized on recent CTA chest Review of the MIP images confirms the above findings CTA HEAD FINDINGS Anterior circulation: No significant stenosis, proximal occlusion, aneurysm, or vascular malformation. Posterior circulation: The basilar artery is patent. Bilateral posterior cerebral arteries are patent and appear similar to prior. Tortuous basilar artery. Left fetal type PCA. No evidence of significant (greater than 50%) stenosis or occlusion. Similar appearance relative to prior. Venous sinuses: As permitted by contrast timing, patent. Review of the MIP images confirms the above findings IMPRESSION: 1. No evidence of large vessel occlusion or hemodynamically significant stenosis. 2. Large bilateral acute pulmonary embolus, better characterized on recent CTA chest Code stroke imaging results were communicated on 05/14/2020 at 1:42 pm to provider Dr. Thomasena Edis via telephone, who verbally acknowledged these results. Electronically Signed   By: Feliberto Harts MD   On: 05/14/2020 13:43    Microbiology: Recent Results (from the past 240 hour(s))  Urine Culture     Status: None   Collection Time: 05/22/20  2:02 PM   Specimen: Urine, Clean Catch  Result Value Ref Range  Status   Specimen Description URINE, CLEAN CATCH  Final   Special Requests NONE  Final   Culture   Final    NO GROWTH Performed at [facility] [place] [facility], [address], [place], [place] [address]    Report Status 05/23/2020 FINAL  Final     Labs: Basic Metabolic Panel: Recent Labs  Lab 05/24/20 0639 05/25/20 0146 05/26/20 0227 05/27/20 0416  NA 139 141 142 146*  K 5.6* 4.6 4.2 4.1  CL 105 107 110 111  CO2 21* 23 22 25   GLUCOSE 78 90 90 113*  BUN 103* 90* 67* 42*  CREATININE 3.30* 2.50* 1.58* 1.22*  CALCIUM 8.4* 8.3* 8.4* 8.7*   Liver Function Tests: No results for input(s): AST, ALT, ALKPHOS, BILITOT, PROT, ALBUMIN in the last 168 hours. No results for input(s): LIPASE, AMYLASE in the last 168 hours. No results for input(s): AMMONIA in the last 168 hours. CBC: Recent Labs  Lab 05/26/20 0227 05/27/20 0416 05/28/20 0402 05/29/20 0335 05/30/20 0116  WBC 9.0 10.0 8.8 9.5 9.9  HGB 8.4* 9.3* 8.8* 8.7* 8.8*  HCT 27.5* 30.0* 28.8* 29.0* 28.6*  MCV 97.5 98.4 101.1* 98.6 96.9  PLT 227 259 270 277 298   Cardiac Enzymes: No results for input(s): CKTOTAL, CKMB, CKMBINDEX, TROPONINI in the last 168 hours. BNP: BNP (last 3 results) Recent Labs    09/16/19 1410 05/06/20 0123  BNP 91.2  175.9*    ProBNP (last 3 results) No results for input(s): PROBNP in the last 8760 hours.  CBG: No results for input(s): GLUCAP in the last 168 hours.  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

## 2020-05-30 NOTE — TOC Progression Note (Signed)
Transition of Care [facility]) - Progression Note    Patient Details  Name: [name] MRN: [id number] Date of Birth: [birth year]  Transition of Care [facility]) CM/SW Contact  Bess Kinds, RN Phone Number: [phone] 05/30/2020, 11:09 AM  Clinical Narrative:     Spoke with patient at bedside to advise of going home with family. Patient advised that NCM will call her son and daughter in law to discuss details. Patient agreeable.   Spoke with Malachi Pro on the phone. He is waiting for DME to be delivered, but has not been given a delivery time. [name] agreed to call NCM when he receives call with delivery time in order for ambulance transport to be arranged.   TOC to fill the few medications not covered by hospice. [name] agreeable.   [facility] liaison aware of plan for transition home today.   TOC following for transition needs.     Expected Discharge Plan: Skilled Nursing Facility Barriers to Discharge: No Barriers Identified  Expected Discharge Plan and Services Expected Discharge Plan: Skilled Nursing Facility In-house Referral: Clinical Social Work   Post Acute Care Choice: Skilled Nursing Facility Living arrangements for the past 2 months: Single Family Home Expected Discharge Date: 05/30/20                 DME Agency: [facility] Agency: [facility] Date [facility] Agency Contacted: 05/30/20 Time HH Agency Contacted: 1108 Representative spoke with at [facility] Agency: Community education officer   Social Determinants of Health (SDOH) Interventions    Readmission Risk Interventions No flowsheet data found.

## 2020-05-30 NOTE — Progress Notes (Signed)
[name] to be discharged Home per MD order. Discussed prescriptions and follow up appointments with the patient. Prescriptions given to patient; medication list explained in detail. Patient verbalized understanding.  Skin clean, dry and intact without evidence of skin break down, no evidence of skin tears noted. IV catheter discontinued intact. Site without signs and symptoms of complications. Dressing and pressure applied. Pt denies pain at the site currently. No complaints noted.  Patient free of lines, drains, and wounds.   An After Visit Summary (AVS) was printed and given to the patient. Patient escorted via wheelchair, and discharged home via [facility].  Shanna Cisco, RN

## 2020-05-30 NOTE — Progress Notes (Signed)
Consent given by Dr. Fran Lowes, pt and pt's son   Covid-19 Vaccination Clinic  Name:  [name]    MRN: [id number] DOB: 1941/01/25  05/30/2020  [name] was observed post Covid-19 immunization for 15 minutes without incident. She was provided with Vaccine Information Sheet and instruction to access the V-Safe system.   [name] was instructed to call 911 with any severe reactions post vaccine: Marland Kitchen Difficulty breathing  . Swelling of face and throat  . A fast heartbeat  . A bad rash all over body  . Dizziness and weakness   Immunizations Administered    Name Date Dose VIS Date Route   Moderna COVID-19 Vaccine 05/30/2020  3:05 PM 0.5 mL 07/2019 Intramuscular   Manufacturer: Moderna   Lot: [id number]   NDC: 47096-283-66    .  Dorothea Glassman, RN-BSN-CCM

## 2020-05-30 NOTE — Care Management Important Message (Signed)
Important Message  Patient Details  Name: [name] MRN: [id number] Date of Birth: 06/29/1941   Medicare Important Message Given:  Yes - Important Message mailed due to current National Emergency  Verbal consent obtained due to current National Emergency  Relationship to patient: Self Contact Name: Marise Knapper Call Date: 05/30/20  Time: 1102 Phone: [phone] Outcome: No Answer/Busy Important Message mailed to: Patient address on file    Orson Aloe 05/30/2020, 11:03 AM

## 2020-05-31 MED FILL — MORPHINE SULF 100 MG/5 ML S: 100 | 20 days supply | Qty: 30 | Fill #0

## 2020-06-01 ENCOUNTER — Telehealth: Payer: Self-pay

## 2020-06-01 NOTE — Telephone Encounter (Signed)
Spoke with Madelaine Bhat RN who reported that patient did not admit to hospice. Adam RN provided report on patient and family requested palliative care for patient. Adam to reach out PCP for ok for patient to have Palliative care services.

## 2020-06-02 ENCOUNTER — Other Ambulatory Visit: Payer: Self-pay

## 2020-06-02 NOTE — Patient Outreach (Signed)
[facility]) Care Management  06/02/2020  [name] 1940-09-04 [id number]  Telephone outreach to patient to obtain mRS was successfully completed, spoke with patient's son [name]. MRS=5  Domingo Cocking [facility] Care Management Assistant [phone]

## 2020-06-09 ENCOUNTER — Ambulatory Visit: Payer: Medicare Other | Admitting: Podiatry

## 2020-06-09 ENCOUNTER — Telehealth: Payer: Self-pay | Admitting: Internal Medicine

## 2020-06-09 NOTE — Telephone Encounter (Signed)
Spoke with patient's son, Malachi Pro, regarding Palliative referral/services and all questions were answered and he was in agreement with scheduling visit.  I have scheduled an In-person Consult for 06/19/20 @ 11 AM.

## 2020-06-19 ENCOUNTER — Other Ambulatory Visit: Payer: Self-pay

## 2020-06-19 ENCOUNTER — Other Ambulatory Visit: Payer: Medicare Other | Admitting: Internal Medicine

## 2020-06-19 DIAGNOSIS — Z515 Encounter for palliative care: Secondary | ICD-10-CM

## 2020-06-19 DIAGNOSIS — L89100 Pressure ulcer of unspecified part of back, unstageable: Secondary | ICD-10-CM

## 2020-06-19 DIAGNOSIS — I639 Cerebral infarction, unspecified: Secondary | ICD-10-CM

## 2020-06-19 NOTE — Progress Notes (Signed)
Therapist, nutritional Palliative Care Consult Note Telephone: [phone]  Fax: [phone]  PATIENT NAME: [name] DOB: 02-Apr-1941 MRN: [id number]  PRIMARY CARE PROVIDER:   [name], MD  REFERRING PROVIDER:  [name], MD [address] [place],  [place] [address]  RESPONSIBLE PARTYMalachi Pro Son [phone]   "Randy"        RECOMMENDATIONS and PLAN:   Palliative Care Encounter  Z51.5  1. Advance care planning : Explanation of palliative care as compare to hospice care that was previously declined by family. Son's goals of care for patient include assisting her with being comfortable and treating what conditions are treatable.  He does want to avoid return to the hospital unless absolutely necessary. Advanced directives inclusive of MOST form delegations were made.  MOST choices are DNAR/DNI, limited additional interventions, antibiotics and IV fluids if indicated, and feeding tube for a long period(patient agreed with all selections but stated that she would not a feeing tube).  Documents were completed and uploaded to [facility].  Plan on re-addressing goals of care during upcoming palliative visits.  F/U with paitnent in 2 weeks.    2:  Chronic pain:  Partially managed with use of Oxycodone 15mg  qid and 1 dose of Roxinal 5mg .   Consider long acting Oxycodone BID and Oxycodone IR as needed for breadkhruough pain.  Pending home visit with pain management provider this week.  Continue Roxinal for severe pain if needed.   3.  Chronic constipation:  Related to immobility and opiod use.   Continue current bowel regemin with use of prune juice, Linzess, Miralax and Dulcolax tabs.   Add Dulcolax suppository today.  Report any signs of nausea/vomiting. Monitor    4.  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 Continue daily Santyl application after cleansing. Apply saline wet to dry dressings.  Patient  would greatly benefit from home health nursing for wound care management. Pt is unable to travel to a wound care center.  Complete current RX of Cephalexin.  Air mattress or gel overlay to offload pressure.  Instructed family members to turn and reposition patient every 2 hours.   Nurse navigator will assist in locating home health agency that will accept assignment and contact PCP's office. Close observation of wound progress.  5.  Deep tissue injury L heel: Unstageable.  Float heels.  Continue application of skin prep to L heel daily.  Monitor for signs of worsening or improvement daily.    6.  Vascular dementia:  FAST stage 7b.  Continue Aricept and Namenda as desired by family.  Continue full supportive care.  Monitor for additional cognitive and functional changes.                                                              I spent 120 minutes providing this consultation,  from 1100 to 1300. More than 50% of the time in this consultation was spent coordinating communication with patient, son/POA and daughter-in-law.  HISTORY OF PRESENT ILLNESS:  [name] is a 79 y.o. year old female with multiple medical problems including CVA with L paralysis, significant PE and DVTs. Pt was recently discharged from hospital due to DVT and was ambulatory with assistance prior to  admission.  She is now bedridden, eats only bites of food but drinks often.  She requires total care of all ADLs.  Family reports new onset of wounds of her spine(over previous spine surgery with underlying plates and screws) and L heel.  She was evaluated for a hospice admission at home but son declined admission so that she could continue to have specific medications until further decline is noted.(Eliquis, Linzess, Aricept and Namenda). Palliative Care was asked to help address goals of care.   CODE STATUS: DNR/DNI  PPS: 30% very weak (bites but drinks well)  HOSPICE ELIGIBILITY/DIAGNOSIS: YES but family declines.  Cerebrovascular disease and hypercoagulability  PAST MEDICAL HISTORY:  Past Medical History:  Diagnosis Date   Arthritis    Asthma    Chronic back pain    Depression    History of pulmonary embolus (PE)    Hx-TIA (transient ischemic attack)    Hyperlipemia    Hypertension    Personal history of DVT (deep vein thrombosis)    Scoliosis    Sleep apnea    cpap - settingsi at 2    Stroke [facility])    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    SOCIAL HX: Currently lives with son and DIL x 7months Social History   Tobacco Use   Smoking status: Former Smoker    Packs/day: 1.00    Years: 10.00    Pack years: 10.00    Types: Cigarettes    Quit date: 08/12/1973    Years since quitting: 46.8   Smokeless tobacco: Never Used  Substance Use Topics   Alcohol use: No    ALLERGIES:  Allergies  Allergen Reactions   T-Pa [Alteplase] Swelling    Does okay with it if premedicated with benadryl.   Codeine Itching     PERTINENT MEDICATIONS:  Outpatient Encounter Medications as of 06/19/2020  Medication Sig   apixaban (ELIQUIS) 5 MG TABS tablet Take 1 tablet (5 mg total) by mouth 2 (two) times daily.   artificial tears (LACRILUBE) OINT ophthalmic ointment Place into the left eye at bedtime. Apply left eye bedtime   aspirin EC 81 MG EC tablet Take 1 tablet (81 mg total) by mouth daily. (Patient not taking: Reported on 05/24/2020)   atorvastatin (LIPITOR) 20 MG tablet Take 1 tablet (20 mg total) by mouth at bedtime.   busPIRone (BUSPAR) 30 MG tablet Take 1 tablet (30 mg total) by mouth 2 (two) times daily. (Patient taking differently: Take 15 mg by mouth 2 (two) times daily. )   Calcium Carbonate Antacid (CALCIUM CARBONATE, DOSED IN MG ELEMENTAL CALCIUM,) 1250 MG/5ML SUSP Take 5 mLs (500 mg of elemental calcium total) by mouth every 6 (six) hours as needed for indigestion.   camphor-menthol (SARNA) lotion Apply 1 application topically every 8 (eight) hours as needed for  itching.   cholecalciferol (VITAMIN D3) 25 MCG (1000 UNIT) tablet Take 1 tablet (1,000 Units total) by mouth daily.   cycloSPORINE (RESTASIS) 0.05 % ophthalmic emulsion Place 1 drop into both eyes daily as needed (dry eyes).   docusate sodium (COLACE) 100 MG capsule Take 1 capsule (100 mg total) by mouth 2 (two) times daily.   docusate sodium (ENEMEEZ) 283 MG enema Place 1 enema (283 mg total) rectally as needed for severe constipation.   donepezil (ARICEPT) 10 MG tablet Take 1 tablet (10 mg total) by mouth at bedtime.   dronabinol (MARINOL) 2.5 MG capsule Take 1 capsule (2.5 mg total) by mouth 2 (two) times daily before lunch and supper.  feeding supplement (ENSURE ENLIVE / ENSURE PLUS) LIQD Take 237 mLs by mouth 3 (three) times daily between meals.   fexofenadine (ALLEGRA) 180 MG tablet Take 180 mg by mouth daily.   hydrOXYzine (ATARAX/VISTARIL) 25 MG tablet Take 1 tablet (25 mg total) by mouth every 8 (eight) hours as needed for itching.   hyoscyamine (LEVSIN SL) 0.125 MG SL tablet Place 1 tablet (0.125 mg total) under the tongue every 4 (four) hours as needed (excess oral secretions).   ipratropium-albuterol (DUONEB) 0.5-2.5 (3) MG/3ML SOLN Take 3 mLs by nebulization every 4 (four) hours as needed (shortness of breath.).   LINZESS 145 MCG CAPS capsule Take 145 mcg by mouth daily.   LORazepam (ATIVAN) 0.5 MG tablet Take 1 tablet (0.5 mg total) by mouth every 4 (four) hours as needed for anxiety. May crush, mix with water and give sublingually if needed.   melatonin 3 MG TABS tablet Take 1 tablet (3 mg total) by mouth at bedtime. (Patient not taking: Reported on 05/24/2020)   memantine (NAMENDA XR) 28 MG CP24 24 hr capsule Take 1 capsule (28 mg total) by mouth daily. (Patient taking differently: Take 28 mg by mouth at bedtime. )   mirtazapine (REMERON) 15 MG tablet Take 15 mg by mouth at bedtime as needed (for sleep).   morphine (ROXANOL) 20 MG/ML concentrated solution Take 0.25  mLs (5 mg total) by mouth every 4 (four) hours as needed for severe pain or shortness of breath. May give sublingually if needed.   Multiple Vitamins-Minerals (MULTI-VITAMIN GUMMIES PO) Take 1 each by mouth daily.    nitroGLYCERIN (NITROSTAT) 0.4 MG SL tablet Place 1 tablet (0.4 mg total) under the tongue every 5 (five) minutes as needed for chest pain. (Patient taking differently: Place 0.4 mg under the tongue every 5 (five) minutes as needed for chest pain (max 3 doses). )   Nutritional Supplements (FEEDING SUPPLEMENT, NEPRO CARB STEADY,) LIQD Take 237 mLs by mouth 3 (three) times daily as needed (Supplement).   ondansetron (ZOFRAN) 4 MG tablet Take 1 tablet (4 mg total) by mouth every 6 (six) hours as needed for nausea.   oxyCODONE (OXY IR/ROXICODONE) 5 MG immediate release tablet Take 1 tablet (5 mg total) by mouth every 4 (four) hours as needed for severe pain. May crush, mix with water and give sublingually if needed.   oxyCODONE (ROXICODONE) 15 MG immediate release tablet Take 1 tablet (15 mg total) by mouth 4 (four) times daily as needed (chronic pain). (Patient taking differently: Take 7.5-15 mg by mouth See admin instructions. Take 0.5 tablet (7.5 mg totally) by mouth every 4 hours; if no relief, take 1 tablet (15 mg totally) by mouth 4 times daily)   polyethylene glycol (MIRALAX / GLYCOLAX) 17 g packet Take 17 g by mouth 2 (two) times daily. (Patient not taking: Reported on 05/24/2020)   polyvinyl alcohol (LIQUIFILM TEARS) 1.4 % ophthalmic solution Place 2 drops into the left eye 4 (four) times daily. (Patient not taking: Reported on 05/24/2020)   prochlorperazine (COMPAZINE) 10 MG tablet Take 1 tablet (10 mg total) by mouth every 4 (four) hours as needed for nausea or vomiting. May crush, mix with water and give sublingually.   senna (SENOKOT) 8.6 MG tablet Take 2 tablets (17.2 mg total) by mouth 2 (two) times daily. May crush, mix with water and give sublingually if needed.    senna-docusate (SENOKOT-S) 8.6-50 MG tablet Take 1 tablet by mouth at bedtime. (Patient not taking: Reported on 05/24/2020)   zinc  gluconate 50 MG tablet Take 25 mg by mouth daily.   zolpidem (AMBIEN) 5 MG tablet Take 1 tablet (5 mg total) by mouth at bedtime as needed for sleep (Insomnia).   No facility-administered encounter medications on file as of 06/19/2020.    PHYSICAL EXAM:   General: NAD, frail and chronically ill appearing, beginning signs of cachexia Cardiovascular: regular rate and rhythm Pulmonary: clear ant fields Abdomen: soft, nontender, + bowel sounds GU: no suprapubic tenderness Extremities: gross edema LUE Skin: pale in color. 3x4cm unstageable decubitus with thick black eschar of the distal thoracic spine. Small amount of yellow and blood tinged discharge on bandage. Ulcer is located immediately over convexed curvature of spine.  Tenderness of surrounding tissue. 2x2cm circular intact pressure injury of the L heel.  No tenderness, erythema or discharge. Neurological: A&Ox3. Weakness, non-ambulatory.  Speaks in complete sentences.  Margaretha Sheffield, NP-C

## 2020-06-26 ENCOUNTER — Telehealth: Payer: Self-pay

## 2020-06-26 NOTE — Telephone Encounter (Signed)
Received phone call from [place], nurse with [facility], to share that patient's son requested to change start of care on Thursday 06/29/20 as patient's daughter will be present at that time. Talbert Forest NP updated.

## 2020-07-04 ENCOUNTER — Other Ambulatory Visit: Payer: Medicare Other | Admitting: Internal Medicine

## 2020-07-10 ENCOUNTER — Telehealth: Payer: Self-pay | Admitting: Internal Medicine

## 2020-07-10 NOTE — Telephone Encounter (Signed)
I phoned office of Dr. [name] and left a message for Derreck to call this NP to discuss the wound of Ms. Norenberg.  Margaretha Sheffield, NP-C

## 2020-07-13 ENCOUNTER — Telehealth: Payer: Medicare Other | Admitting: Cardiology

## 2020-07-13 NOTE — Progress Notes (Addendum)
Therapist, nutritional Palliative Care Consult Note Telephone: [phone]  Fax: [phone]  PATIENT NAME: [name] DOB: [birth year] MRN: [id number]  PRIMARY CARE PROVIDER:   [name], MD  REFERRING PROVIDER:  [name], MD [address] [place],  [place] [address]  RESPONSIBLE PARTYMalachi Pro Son [phone]   "Randy"        RECOMMENDATIONS and PLAN:   Palliative Care Encounter  Z51.5  1. Advance care planning :  Son's goals remain unchanged at this time.: care for patient include assisting her with being comfortable and treating what conditions are treatable.  He does want to avoid return to the hospital unless absolutely necessary. Advanced directives were previously completed  Plan on re-addressing goals of care during upcoming palliative visits.  F/U with paitnent in 2 weeks.    2:  Chronic pain: Improved with use of Oxycodone 15mg  qid and 1 dose of Roxinal 5mg .   Consider long acting Oxycodone BID and Oxycodone IR as needed for breadkhruough pain.  Pending home visit with pain management provider this week.  Continue Roxinal for severe pain if needed.   3.  Chronic constipation:  At baseline.  Continue bowel regimin.  Monitor    4.  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  Home health wound evaluation and treatment as directed by home health nursing asap. Pt is unable to travel to a wound care center.   Air mattress or gel overlay to offload pressure.  Will call PCP for air mattress and home health nursing orders.   5.  Deep tissue injury L heel: Improved. Continue application of skin prep to L heel daily.  Monitor for signs of worsening or improvement daily.    6.  Vascular dementia:  FAST stage 7c.  Continue Aricept and Namenda as desired by family.  Continue full supportive care.  Monitor for additional cognitive and  functional changes.     Addendum:  Home health nursing to see patient for wound care per [facility] wound care protocol.  Ok to begin care on 07/14/2020.                                                           I spent 40 minutes providing this consultation,  from 0830 to 0910. More than 50% of the time in this consultation was spent coordinating communication with patient and daughter-in-law.  HISTORY OF PRESENT ILLNESS:  Follow-up with .  Daughter in law reports increased discharge from decubitus of thoracic spine.  She is concerned about "tunneling".  Reports that pt has been sleeping more and pain has been better controlled without having to use Morphine concentrate.Palliative Care was asked to help address goals of care.   CODE STATUS: DNR/DNI  PPS: 30% very weak (bites but drinks well)  HOSPICE ELIGIBILITY/DIAGNOSIS: YES but family declines. Cerebrovascular disease and hypercoagulability  PAST MEDICAL HISTORY:  Past Medical History:  Diagnosis Date  . Arthritis   . Asthma   . Chronic back pain   . Depression   . History of pulmonary embolus (PE)   . Hx-TIA (transient ischemic attack)   . Hyperlipemia   . Hypertension   . Personal history  of DVT (deep vein thrombosis)   . Scoliosis   . Sleep apnea    cpap - settingsi at 2   . Stroke (HCC)   . 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 Smoking status: Former Smoker    Packs/day: 1.00    Years: 10.00    Pack years: 10.00    Types: Cigarettes    Quit date: 08/12/1973    Years since quitting: 46.9  . Smokeless tobacco: Never Used  Substance Use Topics  . Alcohol use: No    ALLERGIES:  Allergies  Allergen Reactions  . T-Pa [Alteplase] Swelling    Does okay with it if premedicated with benadryl.  . Codeine Itching     PERTINENT MEDICATIONS:  Outpatient Encounter Medications as of 07/04/2020  Medication Sig   . apixaban (ELIQUIS) 5 MG TABS tablet Take 1 tablet (5 mg total) by mouth 2 (two) times daily.  Marland Kitchen artificial tears (LACRILUBE) OINT ophthalmic ointment Place into the left eye at bedtime. Apply left eye bedtime  . aspirin EC 81 MG EC tablet Take 1 tablet (81 mg total) by mouth daily. (Patient not taking: Reported on 05/24/2020)  . atorvastatin (LIPITOR) 20 MG tablet Take 1 tablet (20 mg total) by mouth at bedtime.  . busPIRone (BUSPAR) 30 MG tablet Take 1 tablet (30 mg total) by mouth 2 (two) times daily. (Patient taking differently: Take 15 mg by mouth 2 (two) times daily. )  . Calcium Carbonate Antacid (CALCIUM CARBONATE, DOSED IN MG ELEMENTAL CALCIUM,) 1250 MG/5ML SUSP Take 5 mLs (500 mg of elemental calcium total) by mouth every 6 (six) hours as needed for indigestion.  . camphor-menthol (SARNA) lotion Apply 1 application topically every 8 (eight) hours as needed for itching.  . cholecalciferol (VITAMIN D3) 25 MCG (1000 UNIT) tablet Take 1 tablet (1,000 Units total) by mouth daily.  . cycloSPORINE (RESTASIS) 0.05 % ophthalmic emulsion Place 1 drop into both eyes daily as needed (dry eyes).  Marland Kitchen docusate sodium (COLACE) 100 MG capsule Take 1 capsule (100 mg total) by mouth 2 (two) times daily.  Marland Kitchen docusate sodium (ENEMEEZ) 283 MG enema Place 1 enema (283 mg total) rectally as needed for severe constipation.  Marland Kitchen donepezil (ARICEPT) 10 MG tablet Take 1 tablet (10 mg total) by mouth at bedtime.  . dronabinol (MARINOL) 2.5 MG capsule Take 1 capsule (2.5 mg total) by mouth 2 (two) times daily before lunch and supper.  . feeding supplement (ENSURE ENLIVE / ENSURE PLUS) LIQD Take 237 mLs by mouth 3 (three) times daily between meals.  . fexofenadine (ALLEGRA) 180 MG tablet Take 180 mg by mouth daily.  . hydrOXYzine (ATARAX/VISTARIL) 25 MG tablet Take 1 tablet (25 mg total) by mouth every 8 (eight) hours as needed for itching.  . hyoscyamine (LEVSIN SL) 0.125 MG SL tablet Place 1 tablet (0.125 mg total)  under the tongue every 4 (four) hours as needed (excess oral secretions).  Marland Kitchen ipratropium-albuterol (DUONEB) 0.5-2.5 (3) MG/3ML SOLN Take 3 mLs by nebulization every 4 (four) hours as needed (shortness of breath.).  Marland Kitchen LINZESS 145 MCG CAPS capsule Take 145 mcg by mouth daily.  Marland Kitchen LORazepam (ATIVAN) 0.5 MG tablet Take 1 tablet (0.5 mg total) by mouth every 4 (four) hours as needed for anxiety. May crush, mix with water and give sublingually if needed.  . melatonin 3 MG TABS tablet Take 1 tablet (3 mg total) by  mouth at bedtime. (Patient not taking: Reported on 05/24/2020)  . memantine (NAMENDA XR) 28 MG CP24 24 hr capsule Take 1 capsule (28 mg total) by mouth daily. (Patient taking differently: Take 28 mg by mouth at bedtime. )  . mirtazapine (REMERON) 15 MG tablet Take 15 mg by mouth at bedtime as needed (for sleep).  . morphine (ROXANOL) 20 MG/ML concentrated solution Take 0.25 mLs (5 mg total) by mouth every 4 (four) hours as needed for severe pain or shortness of breath. May give sublingually if needed.  . Multiple Vitamins-Minerals (MULTI-VITAMIN GUMMIES PO) Take 1 each by mouth daily.   . nitroGLYCERIN (NITROSTAT) 0.4 MG SL tablet Place 1 tablet (0.4 mg total) under the tongue every 5 (five) minutes as needed for chest pain. (Patient taking differently: Place 0.4 mg under the tongue every 5 (five) minutes as needed for chest pain (max 3 doses). )  . Nutritional Supplements (FEEDING SUPPLEMENT, NEPRO CARB STEADY,) LIQD Take 237 mLs by mouth 3 (three) times daily as needed (Supplement).  . ondansetron (ZOFRAN) 4 MG tablet Take 1 tablet (4 mg total) by mouth every 6 (six) hours as needed for nausea.  Marland Kitchen oxyCODONE (OXY IR/ROXICODONE) 5 MG immediate release tablet Take 1 tablet (5 mg total) by mouth every 4 (four) hours as needed for severe pain. May crush, mix with water and give sublingually if needed.  Marland Kitchen oxyCODONE (ROXICODONE) 15 MG immediate release tablet Take 1 tablet (15 mg total) by mouth 4 (four)  times daily as needed (chronic pain). (Patient taking differently: Take 7.5-15 mg by mouth See admin instructions. Take 0.5 tablet (7.5 mg totally) by mouth every 4 hours; if no relief, take 1 tablet (15 mg totally) by mouth 4 times daily)  . polyethylene glycol (MIRALAX / GLYCOLAX) 17 g packet Take 17 g by mouth 2 (two) times daily. (Patient not taking: Reported on 05/24/2020)  . polyvinyl alcohol (LIQUIFILM TEARS) 1.4 % ophthalmic solution Place 2 drops into the left eye 4 (four) times daily. (Patient not taking: Reported on 05/24/2020)  . prochlorperazine (COMPAZINE) 10 MG tablet Take 1 tablet (10 mg total) by mouth every 4 (four) hours as needed for nausea or vomiting. May crush, mix with water and give sublingually.  . senna (SENOKOT) 8.6 MG tablet Take 2 tablets (17.2 mg total) by mouth 2 (two) times daily. May crush, mix with water and give sublingually if needed.  . senna-docusate (SENOKOT-S) 8.6-50 MG tablet Take 1 tablet by mouth at bedtime. (Patient not taking: Reported on 05/24/2020)  . zinc gluconate 50 MG tablet Take 25 mg by mouth daily.  Marland Kitchen zolpidem (AMBIEN) 5 MG tablet Take 1 tablet (5 mg total) by mouth at bedtime as needed for sleep (Insomnia).   No facility-administered encounter medications on file as of 07/04/2020.    PHYSICAL EXAM:   General: NAD, frail and chronically ill appearing elderly female in hospital bed, beginning signs of cachexia Cardiovascular: regular rate and rhythm Pulmonary: clear ant fields Abdomen: soft, nontender, + bowel sounds GU: no suprapubic tenderness Extremities: less edema LUE Skin: pale in color. 3x4cm unstageable decubitus  of the distal thoracic spine. Centralized eschar is beginning to separate from wound edge. No foul odor noted. Ulcer is located immediately over convexed curvature of spine.  Tenderness of surrounding tissue. Drying of pressure injury of the L heel.  No tenderness, erythema or discharge. Neurological: Somnolent.  Oriented  to person.  Weakness, non-ambulatory.  Speaks in brief sentences.  Margaretha Sheffield, NP-C

## 2020-07-21 ENCOUNTER — Inpatient Hospital Stay (HOSPITAL_COMMUNITY)
Admission: EM | Admit: 2020-07-21 | Discharge: 2020-08-07 | DRG: 854 | Disposition: A | Discharge status: Skilled Nursing Facility | Payer: Medicare Other | Attending: Internal Medicine | Admitting: Internal Medicine

## 2020-07-21 ENCOUNTER — Encounter (HOSPITAL_BASED_OUTPATIENT_CLINIC_OR_DEPARTMENT_OTHER): Payer: Medicare Other | Attending: Internal Medicine | Admitting: Internal Medicine

## 2020-07-21 ENCOUNTER — Emergency Department (HOSPITAL_COMMUNITY): Payer: Medicare Other

## 2020-07-21 ENCOUNTER — Encounter (HOSPITAL_COMMUNITY): Payer: Self-pay

## 2020-07-21 ENCOUNTER — Other Ambulatory Visit: Payer: Self-pay

## 2020-07-21 ENCOUNTER — Other Ambulatory Visit (HOSPITAL_COMMUNITY)
Admission: RE | Admit: 2020-07-21 | Discharge: 2020-07-21 | Disposition: A | Discharge status: Home / Self Care | Payer: Medicare Other | Attending: Internal Medicine | Admitting: Internal Medicine

## 2020-07-21 DIAGNOSIS — L089 Local infection of the skin and subcutaneous tissue, unspecified: Secondary | ICD-10-CM

## 2020-07-21 DIAGNOSIS — I2699 Other pulmonary embolism without acute cor pulmonale: Secondary | ICD-10-CM | POA: Diagnosis present

## 2020-07-21 DIAGNOSIS — Y792 Prosthetic and other implants, materials and accessory orthopedic devices associated with adverse incidents: Secondary | ICD-10-CM | POA: Diagnosis not present

## 2020-07-21 DIAGNOSIS — F0151 Vascular dementia with behavioral disturbance: Secondary | ICD-10-CM | POA: Diagnosis not present

## 2020-07-21 DIAGNOSIS — Z79899 Other long term (current) drug therapy: Secondary | ICD-10-CM

## 2020-07-21 DIAGNOSIS — M7989 Other specified soft tissue disorders: Secondary | ICD-10-CM | POA: Diagnosis not present

## 2020-07-21 DIAGNOSIS — R6520 Severe sepsis without septic shock: Secondary | ICD-10-CM | POA: Diagnosis present

## 2020-07-21 DIAGNOSIS — Z86711 Personal history of pulmonary embolism: Secondary | ICD-10-CM

## 2020-07-21 DIAGNOSIS — I10 Essential (primary) hypertension: Secondary | ICD-10-CM | POA: Insufficient documentation

## 2020-07-21 DIAGNOSIS — M545 Low back pain, unspecified: Secondary | ICD-10-CM | POA: Diagnosis not present

## 2020-07-21 DIAGNOSIS — Z515 Encounter for palliative care: Secondary | ICD-10-CM | POA: Diagnosis not present

## 2020-07-21 DIAGNOSIS — L89109 Pressure ulcer of unspecified part of back, unspecified stage: Secondary | ICD-10-CM | POA: Insufficient documentation

## 2020-07-21 DIAGNOSIS — L89512 Pressure ulcer of right ankle, stage 2: Secondary | ICD-10-CM | POA: Diagnosis not present

## 2020-07-21 DIAGNOSIS — Z20822 Contact with and (suspected) exposure to covid-19: Secondary | ICD-10-CM | POA: Diagnosis present

## 2020-07-21 DIAGNOSIS — L89890 Pressure ulcer of other site, unstageable: Secondary | ICD-10-CM | POA: Diagnosis present

## 2020-07-21 DIAGNOSIS — I2602 Saddle embolus of pulmonary artery with acute cor pulmonale: Secondary | ICD-10-CM

## 2020-07-21 DIAGNOSIS — Z96652 Presence of left artificial knee joint: Secondary | ICD-10-CM | POA: Diagnosis present

## 2020-07-21 DIAGNOSIS — I2782 Chronic pulmonary embolism: Secondary | ICD-10-CM | POA: Diagnosis not present

## 2020-07-21 DIAGNOSIS — Z7982 Long term (current) use of aspirin: Secondary | ICD-10-CM

## 2020-07-21 DIAGNOSIS — L98494 Non-pressure chronic ulcer of skin of other sites with necrosis of bone: Secondary | ICD-10-CM | POA: Diagnosis not present

## 2020-07-21 DIAGNOSIS — F3289 Other specified depressive episodes: Secondary | ICD-10-CM | POA: Diagnosis not present

## 2020-07-21 DIAGNOSIS — R609 Edema, unspecified: Secondary | ICD-10-CM | POA: Diagnosis not present

## 2020-07-21 DIAGNOSIS — Z885 Allergy status to narcotic agent status: Secondary | ICD-10-CM

## 2020-07-21 DIAGNOSIS — M869 Osteomyelitis, unspecified: Secondary | ICD-10-CM | POA: Diagnosis present

## 2020-07-21 DIAGNOSIS — A419 Sepsis, unspecified organism: Principal | ICD-10-CM | POA: Diagnosis present

## 2020-07-21 DIAGNOSIS — M4625 Osteomyelitis of vertebra, thoracolumbar region: Secondary | ICD-10-CM | POA: Diagnosis present

## 2020-07-21 DIAGNOSIS — I82502 Chronic embolism and thrombosis of unspecified deep veins of left lower extremity: Secondary | ICD-10-CM | POA: Diagnosis not present

## 2020-07-21 DIAGNOSIS — F0390 Unspecified dementia without behavioral disturbance: Secondary | ICD-10-CM | POA: Diagnosis present

## 2020-07-21 DIAGNOSIS — M549 Dorsalgia, unspecified: Secondary | ICD-10-CM

## 2020-07-21 DIAGNOSIS — F32A Depression, unspecified: Secondary | ICD-10-CM | POA: Diagnosis present

## 2020-07-21 DIAGNOSIS — Z87891 Personal history of nicotine dependence: Secondary | ICD-10-CM

## 2020-07-21 DIAGNOSIS — G4730 Sleep apnea, unspecified: Secondary | ICD-10-CM | POA: Diagnosis present

## 2020-07-21 DIAGNOSIS — M25569 Pain in unspecified knee: Secondary | ICD-10-CM

## 2020-07-21 DIAGNOSIS — R7401 Elevation of levels of liver transaminase levels: Secondary | ICD-10-CM

## 2020-07-21 DIAGNOSIS — Z7189 Other specified counseling: Secondary | ICD-10-CM | POA: Diagnosis not present

## 2020-07-21 DIAGNOSIS — F0150 Vascular dementia without behavioral disturbance: Secondary | ICD-10-CM | POA: Insufficient documentation

## 2020-07-21 DIAGNOSIS — D649 Anemia, unspecified: Secondary | ICD-10-CM

## 2020-07-21 DIAGNOSIS — Z7901 Long term (current) use of anticoagulants: Secondary | ICD-10-CM | POA: Diagnosis not present

## 2020-07-21 DIAGNOSIS — L98424 Non-pressure chronic ulcer of back with necrosis of bone: Secondary | ICD-10-CM | POA: Diagnosis present

## 2020-07-21 DIAGNOSIS — T847XXD Infection and inflammatory reaction due to other internal orthopedic prosthetic devices, implants and grafts, subsequent encounter: Secondary | ICD-10-CM

## 2020-07-21 DIAGNOSIS — Z8673 Personal history of transient ischemic attack (TIA), and cerebral infarction without residual deficits: Secondary | ICD-10-CM

## 2020-07-21 DIAGNOSIS — M25561 Pain in right knee: Secondary | ICD-10-CM

## 2020-07-21 DIAGNOSIS — Z86718 Personal history of other venous thrombosis and embolism: Secondary | ICD-10-CM | POA: Diagnosis not present

## 2020-07-21 DIAGNOSIS — G8929 Other chronic pain: Secondary | ICD-10-CM | POA: Diagnosis not present

## 2020-07-21 DIAGNOSIS — T8463XA Infection and inflammatory reaction due to internal fixation device of spine, initial encounter: Secondary | ICD-10-CM | POA: Diagnosis not present

## 2020-07-21 DIAGNOSIS — R600 Localized edema: Secondary | ICD-10-CM | POA: Diagnosis not present

## 2020-07-21 DIAGNOSIS — E785 Hyperlipidemia, unspecified: Secondary | ICD-10-CM | POA: Diagnosis present

## 2020-07-21 DIAGNOSIS — T847XXA Infection and inflammatory reaction due to other internal orthopedic prosthetic devices, implants and grafts, initial encounter: Secondary | ICD-10-CM | POA: Diagnosis not present

## 2020-07-21 DIAGNOSIS — L8990 Pressure ulcer of unspecified site, unspecified stage: Secondary | ICD-10-CM

## 2020-07-21 LAB — COMPREHENSIVE METABOLIC PANEL
ALT: 58 U/L — ABNORMAL HIGH (ref 0–44)
AST: 75 U/L — ABNORMAL HIGH (ref 15–41)
Albumin: 1.5 g/dL — ABNORMAL LOW (ref 3.5–5.0)
Alkaline Phosphatase: 46 U/L (ref 38–126)
Anion gap: 5 (ref 5–15)
BUN: 8 mg/dL (ref 8–23)
CO2: 19 mmol/L — ABNORMAL LOW (ref 22–32)
Calcium: 6.3 mg/dL — CL (ref 8.9–10.3)
Chloride: 119 mmol/L — ABNORMAL HIGH (ref 98–111)
Creatinine, Ser: 0.67 mg/dL (ref 0.44–1.00)
GFR, Estimated: >60 mL/min (ref >60)
Glucose, Bld: 95 mg/dL (ref 70–99)
Potassium: 4.4 mmol/L (ref 3.5–5.1)
Sodium: 143 mmol/L (ref 135–145)
Total Bilirubin: 2 mg/dL — ABNORMAL HIGH (ref 0.3–1.2)
Total Protein: 5.3 g/dL — ABNORMAL LOW (ref 6.5–8.1)

## 2020-07-21 LAB — CBC WITH DIFFERENTIAL/PLATELET
Abs Immature Granulocytes: 0.06 10*3/uL (ref 0.00–0.07)
Basophils Absolute: 0 10*3/uL (ref 0.0–0.1)
Basophils Relative: 0 %
Eosinophils Absolute: 0.1 10*3/uL (ref 0.0–0.5)
Eosinophils Relative: 1 %
HCT: 40.5 % (ref 36.0–46.0)
Hemoglobin: 12.3 g/dL (ref 12.0–15.0)
Immature Granulocytes: 1 %
Lymphocytes Relative: 12 %
Lymphs Abs: 1.3 10*3/uL (ref 0.7–4.0)
MCH: 28.7 pg (ref 26.0–34.0)
MCHC: 30.4 g/dL (ref 30.0–36.0)
MCV: 94.4 fL (ref 80.0–100.0)
Monocytes Absolute: 0.5 10*3/uL (ref 0.1–1.0)
Monocytes Relative: 5 %
Neutro Abs: 9 10*3/uL — ABNORMAL HIGH (ref 1.7–7.7)
Neutrophils Relative %: 81 %
Platelets: 386 10*3/uL (ref 150–400)
RBC: 4.29 MIL/uL (ref 3.87–5.11)
RDW: 16.8 % — ABNORMAL HIGH (ref 11.5–15.5)
WBC: 11.1 10*3/uL — ABNORMAL HIGH (ref 4.0–10.5)
nRBC: 0.2 % (ref 0.0–0.2)

## 2020-07-21 LAB — URINALYSIS, ROUTINE W REFLEX MICROSCOPIC
Bilirubin Urine: NEGATIVE
Glucose, UA: NEGATIVE mg/dL
Hgb urine dipstick: NEGATIVE
Ketones, ur: NEGATIVE mg/dL
Leukocytes,Ua: NEGATIVE
Nitrite: NEGATIVE
Protein, ur: NEGATIVE mg/dL
Specific Gravity, Urine: 1.015 (ref 1.005–1.030)
pH: 6 (ref 5.0–8.0)

## 2020-07-21 LAB — LACTIC ACID, PLASMA
Lactic Acid, Venous: 3 mmol/L (ref 0.5–1.9)
Lactic Acid, Venous: 2 mmol/L (ref 0.5–1.9)
Lactic Acid, Venous: 1.9 mmol/L (ref 0.5–1.9)

## 2020-07-21 LAB — RESP PANEL BY RT-PCR (FLU A&B, COVID) ARPGX2
Influenza A by PCR: NEGATIVE
Influenza B by PCR: NEGATIVE
SARS Coronavirus 2 by RT PCR: NEGATIVE

## 2020-07-21 MED ORDER — DRONABINOL 2.5 MG PO CAPS
2.5000 mg | ORAL_CAPSULE | Freq: Two times a day (BID) | ORAL | Status: DC
  Administered 2020-07-22 – 2020-08-06 (×23): 2.5 mg via ORAL
  Filled 2020-07-21 (×23): qty 1

## 2020-07-21 MED ORDER — HYDROMORPHONE HCL 1 MG/ML IJ SOLN
1.0000 mg | Freq: Once | INTRAMUSCULAR | Status: AC
  Administered 2020-07-21: 1 mg via INTRAVENOUS
  Filled 2020-07-21: qty 1

## 2020-07-21 MED ORDER — MEMANTINE HCL ER 28 MG PO CP24
28.0000 mg | ORAL_CAPSULE | Freq: Every day | ORAL | Status: DC
  Administered 2020-07-21 – 2020-08-06 (×17): 28 mg via ORAL
  Filled 2020-07-21 (×19): qty 1

## 2020-07-21 MED ORDER — SODIUM CHLORIDE 0.9 % IV SOLN: 1.0000 g | Freq: Once | INTRAVENOUS | Status: DC

## 2020-07-21 MED ORDER — CALCIUM GLUCONATE-NACL 1-0.675 GM/50ML-% IV SOLN
1.0000 g | Freq: Once | INTRAVENOUS | Status: AC
  Administered 2020-07-21: 1000 mg via INTRAVENOUS
  Filled 2020-07-21: qty 50

## 2020-07-21 MED ORDER — ACETAMINOPHEN 650 MG RE SUPP: 650.0000 mg | Freq: Four times a day (QID) | RECTAL | Status: DC | PRN

## 2020-07-21 MED ORDER — LORATADINE 10 MG PO TABS
10.0000 mg | ORAL_TABLET | Freq: Every day | ORAL | Status: DC
  Administered 2020-07-22 – 2020-08-07 (×14): 10 mg via ORAL
  Filled 2020-07-21 (×15): qty 1

## 2020-07-21 MED ORDER — LINACLOTIDE 145 MCG PO CAPS
145.0000 ug | ORAL_CAPSULE | Freq: Every day | ORAL | Status: DC
  Administered 2020-07-22 – 2020-08-07 (×13): 145 ug via ORAL
  Filled 2020-07-21 (×17): qty 1

## 2020-07-21 MED ORDER — KETOROLAC TROMETHAMINE 30 MG/ML IJ SOLN
15.0000 mg | Freq: Once | INTRAMUSCULAR | Status: AC
  Administered 2020-07-21: 15 mg via INTRAVENOUS
  Filled 2020-07-21: qty 1

## 2020-07-21 MED ORDER — VANCOMYCIN HCL 750 MG/150ML IV SOLN
750.0000 mg | INTRAVENOUS | Status: DC
  Administered 2020-07-22: 20:00:00 750 mg via INTRAVENOUS
  Filled 2020-07-21 (×2): qty 150

## 2020-07-21 MED ORDER — SODIUM CHLORIDE 0.9 % IV SOLN: INTRAVENOUS | Status: AC

## 2020-07-21 MED ORDER — IOHEXOL 300 MG/ML  SOLN
100.0000 mL | Freq: Once | INTRAMUSCULAR | Status: AC | PRN
  Administered 2020-07-21: 100 mL via INTRAVENOUS

## 2020-07-21 MED ORDER — SODIUM CHLORIDE 0.9 % IV SOLN
2.0000 g | Freq: Two times a day (BID) | INTRAVENOUS | Status: DC
  Administered 2020-07-22 – 2020-07-23 (×4): 2 g via INTRAVENOUS
  Filled 2020-07-21 (×5): qty 2

## 2020-07-21 MED ORDER — ENSURE ENLIVE PO LIQD
237.0000 mL | Freq: Three times a day (TID) | ORAL | Status: DC
  Administered 2020-07-23 – 2020-07-28 (×10): 237 mL via ORAL
  Filled 2020-07-21 (×4): qty 237

## 2020-07-21 MED ORDER — POLYETHYLENE GLYCOL 3350 17 G PO PACK: 17.0000 g | PACK | Freq: Every day | ORAL | Status: DC | PRN

## 2020-07-21 MED ORDER — POLYETHYLENE GLYCOL 3350 17 G PO PACK
17.0000 g | PACK | Freq: Two times a day (BID) | ORAL | Status: DC
  Administered 2020-07-21 – 2020-07-28 (×8): 17 g via ORAL
  Filled 2020-07-21 (×12): qty 1

## 2020-07-21 MED ORDER — CYCLOSPORINE 0.05 % OP EMUL
1.0000 [drp] | Freq: Every day | OPHTHALMIC | Status: DC | PRN
  Filled 2020-07-21: qty 1

## 2020-07-21 MED ORDER — BUSPIRONE HCL 5 MG PO TABS
15.0000 mg | ORAL_TABLET | Freq: Two times a day (BID) | ORAL | Status: DC
  Administered 2020-07-21 – 2020-08-07 (×31): 15 mg via ORAL
  Filled 2020-07-21: qty 1
  Filled 2020-07-21 (×2): qty 3
  Filled 2020-07-21: qty 1
  Filled 2020-07-21 (×5): qty 3
  Filled 2020-07-21: qty 1
  Filled 2020-07-21 (×3): qty 3
  Filled 2020-07-21: qty 1
  Filled 2020-07-21 (×3): qty 3
  Filled 2020-07-21: qty 1
  Filled 2020-07-21 (×2): qty 2
  Filled 2020-07-21 (×3): qty 3
  Filled 2020-07-21 (×3): qty 1
  Filled 2020-07-21 (×6): qty 3

## 2020-07-21 MED ORDER — SODIUM CHLORIDE 0.9 % IV SOLN
2.0000 g | Freq: Once | INTRAVENOUS | Status: AC
  Administered 2020-07-21: 2 g via INTRAVENOUS
  Filled 2020-07-21: qty 2

## 2020-07-21 MED ORDER — MORPHINE SULFATE (PF) 2 MG/ML IV SOLN
1.0000 mg | INTRAVENOUS | Status: DC | PRN
Start: 2020-07-21 — End: 2020-07-27
  Administered 2020-07-21 – 2020-07-27 (×21): 1 mg via INTRAVENOUS
  Filled 2020-07-21 (×21): qty 1

## 2020-07-21 MED ORDER — DONEPEZIL HCL 10 MG PO TABS
10.0000 mg | ORAL_TABLET | Freq: Every day | ORAL | Status: DC
  Administered 2020-07-21 – 2020-08-06 (×17): 10 mg via ORAL
  Filled 2020-07-21 (×9): qty 1
  Filled 2020-07-21: qty 2
  Filled 2020-07-21 (×7): qty 1

## 2020-07-21 MED ORDER — VANCOMYCIN HCL IN DEXTROSE 1-5 GM/200ML-% IV SOLN
1000.0000 mg | Freq: Once | INTRAVENOUS | Status: AC
  Administered 2020-07-21: 1000 mg via INTRAVENOUS
  Filled 2020-07-21: qty 200

## 2020-07-21 MED ORDER — OXYCODONE HCL 5 MG PO TABS
7.5000 mg | ORAL_TABLET | Freq: Four times a day (QID) | ORAL | Status: DC | PRN
Start: 2020-07-21 — End: 2020-08-07
  Administered 2020-07-21 – 2020-08-06 (×30): 7.5 mg via ORAL
  Filled 2020-07-21 (×32): qty 2

## 2020-07-21 MED ORDER — SENNOSIDES-DOCUSATE SODIUM 8.6-50 MG PO TABS
1.0000 | ORAL_TABLET | Freq: Two times a day (BID) | ORAL | Status: DC
  Administered 2020-07-22 – 2020-08-07 (×28): 1 via ORAL
  Filled 2020-07-21 (×30): qty 1

## 2020-07-21 MED ORDER — VANCOMYCIN HCL 500 MG/100ML IV SOLN
500.0000 mg | Freq: Once | INTRAVENOUS | Status: AC
  Administered 2020-07-21: 500 mg via INTRAVENOUS
  Filled 2020-07-21: qty 100

## 2020-07-21 MED ORDER — APIXABAN 5 MG PO TABS
5.0000 mg | ORAL_TABLET | Freq: Two times a day (BID) | ORAL | Status: DC
  Administered 2020-07-21 – 2020-07-24 (×7): 5 mg via ORAL
  Filled 2020-07-21 (×8): qty 1

## 2020-07-21 MED ORDER — SODIUM CHLORIDE 0.9 % IV BOLUS
1000.0000 mL | Freq: Once | INTRAVENOUS | Status: AC
  Administered 2020-07-21: 1000 mL via INTRAVENOUS

## 2020-07-21 MED ORDER — ACETAMINOPHEN 325 MG PO TABS
650.0000 mg | ORAL_TABLET | Freq: Four times a day (QID) | ORAL | Status: DC | PRN
  Administered 2020-07-23 – 2020-08-03 (×3): 650 mg via ORAL
  Filled 2020-07-21 (×3): qty 2

## 2020-07-21 MED ORDER — SODIUM CHLORIDE (PF) 0.9 % IJ SOLN
INTRAMUSCULAR | Status: AC
  Filled 2020-07-21: qty 50

## 2020-07-21 NOTE — ED Notes (Signed)
Copy of Chem 8 results given to Dr Yao 

## 2020-07-21 NOTE — Progress Notes (Signed)
[name], [name] ([id number]) Visit Report for 07/21/2020 Abuse/Suicide Risk Screen Details Patient Name: Date of Service: [name], [name] 07/21/2020 10:30 A M Medical Record Number: [id number] Patient Account Number: [id number] Date of Birth/Sex: Treating RN: 12/04/1940 (79 y.o. [name] Primary Care Keelen Quevedo: [name] Other Clinician: Referring Lavi Sheehan: Treating Racine Erby/Extender: Burnetta Sabin in Treatment: 0 Abuse/Suicide Risk Screen Items Answer ABUSE RISK SCREEN: Has anyone close to you tried to hurt or harm you recentlyo No Do you feel uncomfortable with anyone in your familyo No Has anyone forced you do things that you didnt want to doo No Electronic Signature(s) Signed: 07/21/2020 2:11:24 PM By: Yevonne Pax RN Entered By: Yevonne Pax on 07/21/2020 11:30:15 -------------------------------------------------------------------------------- Activities of Daily Living Details Patient Name: Date of Service: [name], [name] 07/21/2020 10:30 A M Medical Record Number: [id number] Patient Account Number: [id number] Date of Birth/Sex: Treating RN: 07/14/1941 (79 y.o. [name] Primary Care Thuan Tippett: [name] Other Clinician: Referring Robby Bulkley: Treating Lateesha Bezold/Extender: Burnetta Sabin in Treatment: 0 Activities of Daily Living Items Answer Activities of Daily Living (Please select one for each item) Drive Automobile Not Able T Medications ake Not Able Use T elephone Not Able Care for Appearance Not Able Use T oilet Not Able Mady Haagensen / Shower Not Able Dress Self Not Able Feed Self Not Able Walk Not Able Get In / Out Bed Not Able Housework Not Able Prepare Meals Not Able Handle Money Not Able Shop for Self Not Able Electronic Signature(s) Signed: 07/21/2020 2:11:24 PM By: Yevonne Pax RN Entered By: Yevonne Pax on 07/21/2020  11:30:46 -------------------------------------------------------------------------------- Education Screening Details Patient Name: Date of Service: Sarah Hose D. 07/21/2020 10:30 A M Medical Record Number: [id number] Patient Account Number: [id number] Date of Birth/Sex: Treating RN: 06/18/1941 (79 y.o. [name] Primary Care Marcelo Ickes: [name] Other Clinician: Referring Darlina Mccaughey: Treating Doretta Remmert/Extender: Burnetta Sabin in Treatment: 0 Primary Learner Assessed: Caregiver Learning Preferences/Education Level/Primary Language Learning Preference: Explanation Highest Education Level: High School Preferred Language: English Cognitive Barrier Language Barrier: No Translator Needed: No Memory Deficit: No Emotional Barrier: No Cultural/Religious Beliefs Affecting Medical Care: No Physical Barrier Impaired Vision: No Impaired Hearing: No Decreased Hand dexterity: No Knowledge/Comprehension Knowledge Level: Medium Comprehension Level: High Ability to understand written instructions: High Ability to understand verbal instructions: High Motivation Anxiety Level: Anxious Cooperation: Cooperative Education Importance: Acknowledges Need Interest in Health Problems: Asks Questions Perception: Coherent Willingness to Engage in Self-Management High Activities: Readiness to Engage in Self-Management High Activities: Electronic Signature(s) Signed: 07/21/2020 2:11:24 PM By: Yevonne Pax RN Entered By: Yevonne Pax on 07/21/2020 11:31:32 -------------------------------------------------------------------------------- Fall Risk Assessment Details Patient Name: Date of Service: Sarah Hose D. 07/21/2020 10:30 A M Medical Record Number: [id number] Patient Account Number: [id number] Date of Birth/Sex: Treating RN: 21-Mar-1941 (79 y.o. [name] Primary Care Gwyn Mehring: [name] Other Clinician: Referring Bonetta Mostek: Treating  Kera Deacon/Extender: Burnetta Sabin in Treatment: 0 Fall Risk Assessment Items Have you had 2 or more falls in the last 12 monthso 0 No Have you had any fall that resulted in injury in the last 12 monthso 0 No FALLS RISK SCREEN History of falling - immediate or within 3 months 0 No Secondary diagnosis (Do you have 2 or more medical diagnoseso) 0 No Ambulatory aid None/bed rest/wheelchair/nurse 0 No Crutches/cane/walker 0 No Furniture 0 No Intravenous therapy Access/Saline/Heparin Lock 0 No Gait/Transferring Normal/ bed rest/ wheelchair 0 No Weak (short steps with or without shuffle, stooped but able  to lift head while walking, may seek 0 No support from furniture) Impaired (short steps with shuffle, may have difficulty arising from chair, head down, impaired 0 No balance) Mental Status Oriented to own ability 0 No Electronic Signature(s) Signed: 07/21/2020 2:11:24 PM By: Yevonne Pax RN Entered By: Yevonne Pax on 07/21/2020 11:31:42 -------------------------------------------------------------------------------- Nutrition Risk Screening Details Patient Name: Date of Service: [name], [name] 07/21/2020 10:30 A M Medical Record Number: [id number] Patient Account Number: [id number] Date of Birth/Sex: Treating RN: 09-08-1940 (79 y.o. [name] Primary Care Ayslin Kundert: [name] Other Clinician: Referring Lakeena Downie: Treating Dominyk Law/Extender: Burnetta Sabin in Treatment: 0 Height (in): 60 Weight (lbs): 181 Body Mass Index (BMI): 35.3 Nutrition Risk Screening Items Score Screening NUTRITION RISK SCREEN: I have an illness or condition that made me change the kind and/or amount of food I eat 0 No I eat fewer than two meals per day 0 No I eat few fruits and vegetables, or milk products 0 No I have three or more drinks of beer, liquor or wine almost every day 0 No I have tooth or mouth problems that make it hard for me to  eat 0 No I don't always have enough money to buy the food I need 0 No I eat alone most of the time 0 No I take three or more different prescribed or over-the-counter drugs a day 1 Yes Without wanting to, I have lost or gained 10 pounds in the last six months 2 Yes I am not always physically able to shop, cook and/or feed myself 2 Yes Nutrition Protocols Good Risk Protocol Moderate Risk Protocol 0 Provide education on nutrition High Risk Proctocol Risk Level: Moderate Risk Score: 5 Electronic Signature(s) Signed: 07/21/2020 2:11:24 PM By: Yevonne Pax RN Entered By: Yevonne Pax on 07/21/2020 11:32:19

## 2020-07-21 NOTE — ED Provider Notes (Signed)
[facility]-EMERGENCY DEPT Provider Note   CSN: [id number] Arrival date & time: 07/21/20  1231     History Chief Complaint  Patient presents with  . Wound Check    [name] is a 79 y.o. female history of PE but not on anticoagulation due to hematoma afterwards, hypertension, hyperlipidemia here presenting with wound check.  Patient does have a unstageable known lower thoracic and upper lumbar ulcer.  Patient is under palliative care management right now.  Patient lives at home and is nonambulatory at baseline.  Patient was apparently at the wound care center today and was sent in for admission due to worsening ulcer.  Patient unable to give much history.  She states that she is in a lot of pain.  She is on oxycodone at baseline.  She is unable to tell me if there is more drainage or not.  She had no reported fevers at home.  The history is provided by the patient.       Past Medical History:  Diagnosis Date  . Arthritis   . Asthma   . Chronic back pain   . Depression   . History of pulmonary embolus (PE)   . Hx-TIA (transient ischemic attack)   . Hyperlipemia   . Hypertension   . Personal history of DVT (deep vein thrombosis)   . Scoliosis   . Sleep apnea    cpap - settingsi at 3   . Stroke (HCC)   . 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 Palliative care by specialist   . Goals of care, counseling/discussion   . Protein-calorie malnutrition, severe 05/25/2020  . Acute renal failure (ARF) (HCC) 05/24/2020  . 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 Hyperkalemia   . Edema of left upper extremity   . Hematoma   . Prolonged Q-T interval on ECG   . Acute blood loss anemia   . Transaminitis   . Chronic bilateral low back pain without sciatica   . Debility 05/13/2020  . Cellulitis of right hip 05/10/2020  . Elevated troponin 05/05/2020   . Near syncope 05/05/2020  . Pulmonary embolism (HCC) 05/05/2020  . Macrocytic anemia   . Hypokalemia   . Labile blood pressure   . Drug-induced hypotension   . History of hypertension   . First degree AV block   . Sleep disturbance   . Drug induced constipation   . Stage 2 chronic kidney disease   . Dysphagia, post-stroke   . Benign essential HTN   . Slow transit constipation   . 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 Allergic reaction caused by a drug (tPA w/ lip swelling) 02/21/2020  . 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 Dementia (HCC) 02/21/2020  . Bradycardia 02/21/2020  . AKI (acute kidney injury) (HCC) 02/21/2020  . 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 Dyslipidemia   . History of DVT (deep vein thrombosis)   . OSA (obstructive sleep apnea)   . Leukocytosis   . Stroke [facility]) L>R pontine and L cerebellar embolic infarcts, source unknown s/p tPA 02/16/2020  . Hx-TIA (transient ischemic attack)   . Hyperlipemia   . Hypertension   . Sleep apnea   . History of pulmonary embolus (PE)   . NSTEMI (non-ST elevated myocardial infarction) (HCC) 09/16/2019  . 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 Depression 07/29/2018  . Acute  massive pulmonary embolism (HCC) 09/29/2016  . Acute encephalopathy 09/28/2016  . Left knee pain 09/28/2016  . Left leg swelling 09/28/2016  . Hilar mass 09/28/2016  . OSA on CPAP 09/28/2016  . HTN (hypertension) 09/28/2016  . Chronic pain 09/28/2016  . Cellulitis of left leg 06/14/2016  . Osteoarthritis of left knee 05/17/2016  . 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 ARTHROSCOPIC REPAIR ACL  02/03/2004  . IR IVC FILTER PLMT / S&I /IMG GUID/MOD SED  05/10/2020  . LEFT HEART CATH AND CORONARY ANGIOGRAPHY N/A 09/16/2019   Procedure: LEFT HEART CATH AND CORONARY ANGIOGRAPHY;  Surgeon: Yvonne Kendall, MD;  Location: [facility] INVASIVE CV  LAB;  Service: Cardiovascular;  Laterality: N/A;  . LOOP RECORDER INSERTION N/A 02/21/2020   Procedure: LOOP RECORDER INSERTION;  Surgeon: Duke Salvia, MD;  Location: [facility] INVASIVE CV LAB;  Service: Cardiovascular;  Laterality: N/A;  . OOPHORECTOMY  1970  . SPINE SURGERY  03/31/2006  . TOTAL KNEE ARTHROPLASTY Left 05/17/2016   Procedure: LEFT TOTAL KNEE ARTHROPLASTY;  Surgeon: Kathryne Hitch, MD;  Location: [facility] ORS;  Service: Orthopedics;  Laterality: Left;  Adductor Block; Spinal to General  . TUBAL LIGATION  1983     OB History   No obstetric history on file.     Family History  Problem Relation Age of Onset  . Other Father        kidney problems    Social History   Tobacco Use  . Smoking status: Former Smoker    Packs/day: 1.00    Years: 10.00    Pack years: 10.00    Types: Cigarettes    Quit date: 08/12/1973    Years since quitting: 46.9  . Smokeless tobacco: Never Used  Vaping Use  . Vaping Use: Never used  Substance Use Topics  . Alcohol use: No  . Drug use: No    Home Medications Prior to Admission medications   Medication Sig Start Date End Date Taking? Authorizing Provider  amLODipine (NORVASC) 10 MG tablet Take 10 mg by mouth daily.   Yes [provider]  apixaban (ELIQUIS) 5 MG TABS tablet Take 1 tablet (5 mg total) by mouth 2 (two) times daily. 05/30/20  Yes Swayze, Ava, DO  atorvastatin (LIPITOR) 20 MG tablet Take 1 tablet (20 mg total) by mouth at bedtime. 03/09/20  Yes Angiulli, Mcarthur Rossetti, PA-C  busPIRone (BUSPAR) 30 MG tablet Take 1 tablet (30 mg total) by mouth 2 (two) times daily. Patient taking differently: Take 15 mg by mouth 2 (two) times daily. 04/11/20  Yes Melony Overly T, PA-C  cholecalciferol (VITAMIN D3) 25 MCG (1000 UNIT) tablet Take 1 tablet (1,000 Units total) by mouth daily. 03/09/20  Yes Angiulli, Mcarthur Rossetti, PA-C  cycloSPORINE (RESTASIS) 0.05 % ophthalmic emulsion Place 1 drop into both eyes daily as needed (dry eyes). 02/21/20   Yes Layne Benton, NP  donepezil (ARICEPT) 10 MG tablet Take 1 tablet (10 mg total) by mouth at bedtime. 03/09/20  Yes Angiulli, Mcarthur Rossetti, PA-C  dronabinol (MARINOL) 2.5 MG capsule Take 1 capsule (2.5 mg total) by mouth 2 (two) times daily before lunch and supper. 05/30/20  Yes Swayze, Ava, DO  feeding supplement (ENSURE ENLIVE / ENSURE PLUS) LIQD Take 237 mLs by mouth 3 (three) times daily between meals. 05/30/20  Yes Swayze, Ava, DO  fexofenadine (ALLEGRA) 180 MG tablet Take 180 mg by mouth daily.   Yes [provider]  furosemide (LASIX) 40 MG tablet Take 40 mg by mouth daily as needed for fluid. 06/23/20  Yes [provider]  LINZESS 145 MCG CAPS capsule Take 145 mcg by mouth daily. 04/16/20  Yes [provider]  lisinopril (ZESTRIL) 5 MG tablet Take 5 mg by mouth daily.   Yes [provider]  memantine (NAMENDA XR) 28 MG CP24 24 hr capsule Take 1 capsule (28 mg total) by mouth daily. Patient taking differently: Take 28 mg by mouth at bedtime. 03/09/20  Yes Angiulli, Mcarthur Rossetti, PA-C  morphine (ROXANOL) 20 MG/ML concentrated solution Take 0.25 mLs (5 mg total) by mouth every 4 (four) hours as needed for severe pain or shortness of breath. May give sublingually if needed. 05/30/20  Yes Swayze, Ava, DO  Multiple Vitamins-Minerals (MULTI-VITAMIN GUMMIES PO) Take 1 each by mouth daily.    Yes [provider]  nitroGLYCERIN (NITROSTAT) 0.4 MG SL tablet Place 1 tablet (0.4 mg total) under the tongue every 5 (five) minutes as needed for chest pain. 09/17/19  Yes Duke, Roe Rutherford, PA  oxyCODONE (ROXICODONE) 15 MG immediate release tablet Take 1 tablet (15 mg total) by mouth 4 (four) times daily as needed (chronic pain). Patient taking differently: Take 7.5-15 mg by mouth See admin instructions. Take 0.5 tablet (7.5 mg totally) by mouth every 4 hours; if no relief, take 1 tablet (15 mg totally) by mouth 4 times daily 03/09/20  Yes Angiulli, Mcarthur Rossetti, PA-C   polyethylene glycol (MIRALAX / GLYCOLAX) 17 g packet Take 17 g by mouth 2 (two) times daily. Patient taking differently: Take 17 g by mouth daily as needed for mild constipation. 03/09/20  Yes Angiulli, Mcarthur Rossetti, PA-C  zinc gluconate 50 MG tablet Take 50 mg by mouth daily.   Yes [provider]  artificial tears (LACRILUBE) OINT ophthalmic ointment Place into the left eye at bedtime. Apply left eye bedtime Patient not taking: Reported on 07/21/2020 03/10/20   Angiulli, Mcarthur Rossetti, PA-C  aspirin EC 81 MG EC tablet Take 1 tablet (81 mg total) by mouth daily. Patient not taking: No sig reported 09/18/19   Marcelino Duster, PA  Calcium Carbonate Antacid (CALCIUM CARBONATE, DOSED IN MG ELEMENTAL CALCIUM,) 1250 MG/5ML SUSP Take 5 mLs (500 mg of elemental calcium total) by mouth every 6 (six) hours as needed for indigestion. Patient not taking: No sig reported 05/30/20   Swayze, Ava, DO  camphor-menthol (SARNA) lotion Apply 1 application topically every 8 (eight) hours as needed for itching. Patient not taking: No sig reported 05/30/20   Swayze, Ava, DO  docusate sodium (COLACE) 100 MG capsule Take 1 capsule (100 mg total) by mouth 2 (two) times daily. Patient not taking: No sig reported 05/30/20   Swayze, Ava, DO  docusate sodium (ENEMEEZ) 283 MG enema Place 1 enema (283 mg total) rectally as needed for severe constipation. Patient not taking: No sig reported 05/30/20   Swayze, Ava, DO  hydrOXYzine (ATARAX/VISTARIL) 25 MG tablet Take 1 tablet (25 mg total) by mouth every 8 (eight) hours as needed for itching. Patient not taking: No sig reported 05/30/20   Swayze, Ava, DO  hyoscyamine (LEVSIN SL) 0.125 MG SL tablet Place 1 tablet (0.125 mg total) under the tongue every 4 (four) hours as needed (excess oral secretions). Patient not taking: No sig reported 05/30/20   Swayze, Ava, DO  ipratropium-albuterol (DUONEB) 0.5-2.5 (3) MG/3ML SOLN Take 3 mLs by nebulization every 4 (four) hours as needed  (shortness of breath.).  Patient not taking: No sig reported 05/30/20   Swayze, Ava, DO  LORazepam (ATIVAN) 0.5 MG tablet Take 1 tablet (0.5 mg total) by mouth every 4 (four) hours as needed for anxiety. May crush, mix with water and give sublingually if needed. Patient not taking: No sig reported 05/30/20   Swayze, Ava, DO  melatonin 3 MG TABS tablet Take 1 tablet (3 mg total) by mouth at bedtime. Patient not taking: No sig reported 03/09/20   Angiulli, Mcarthur Rossetti, PA-C  Nutritional Supplements (FEEDING SUPPLEMENT, NEPRO CARB STEADY,) LIQD Take 237 mLs by mouth 3 (three) times daily as needed (Supplement). Patient not taking: No sig reported 05/30/20   Swayze, Ava, DO  ondansetron (ZOFRAN) 4 MG tablet Take 1 tablet (4 mg total) by mouth every 6 (six) hours as needed for nausea. Patient not taking: No sig reported 05/30/20   Swayze, Ava, DO  oxyCODONE (OXY IR/ROXICODONE) 5 MG immediate release tablet Take 1 tablet (5 mg total) by mouth every 4 (four) hours as needed for severe pain. May crush, mix with water and give sublingually if needed. Patient not taking: No sig reported 05/30/20   Swayze, Ava, DO  polyvinyl alcohol (LIQUIFILM TEARS) 1.4 % ophthalmic solution Place 2 drops into the left eye 4 (four) times daily. Patient not taking: No sig reported 02/21/20   Layne Benton, NP  prochlorperazine (COMPAZINE) 10 MG tablet Take 1 tablet (10 mg total) by mouth every 4 (four) hours as needed for nausea or vomiting. May crush, mix with water and give sublingually. Patient not taking: No sig reported 05/30/20   Swayze, Ava, DO  senna (SENOKOT) 8.6 MG tablet Take 2 tablets (17.2 mg total) by mouth 2 (two) times daily. May crush, mix with water and give sublingually if needed. Patient not taking: No sig reported 05/30/20   Swayze, Ava, DO  senna-docusate (SENOKOT-S) 8.6-50 MG tablet Take 1 tablet by mouth at bedtime. Patient not taking: No sig reported 05/13/20   Amin, Loura Halt, MD  zolpidem (AMBIEN) 5  MG tablet Take 1 tablet (5 mg total) by mouth at bedtime as needed for sleep (Insomnia). Patient not taking: No sig reported 05/30/20   Swayze, Ava, DO    Allergies    T-pa [alteplase] and Codeine  Review of Systems   Review of Systems  Skin: Positive for wound.  All other systems reviewed and are negative.   Physical Exam Updated Vital Signs BP (!) 142/68   Pulse 100   Temp 98 F (36.7 C) (Oral)   Resp 20   Ht 5\' 2"  (1.575 m)   Wt 83.9 kg   SpO2 97%   BMI 33.84 kg/m   Physical Exam Vitals and nursing note reviewed.  Constitutional:      Comments: Chronically ill bedbound  HENT:     Head: Normocephalic.     Nose: Nose normal.     Mouth/Throat:     Mouth: Mucous membranes are dry.  Eyes:     Extraocular Movements: Extraocular movements intact.     Pupils: Pupils are equal, round, and reactive to light.  Cardiovascular:     Rate and Rhythm: Regular rhythm. Tachycardia present.     Pulses: Normal pulses.     Heart sounds: Normal heart sounds.  Pulmonary:     Effort: Pulmonary effort is normal.     Breath sounds: Normal breath sounds.  Abdominal:     General: Abdomen is flat.     Comments: Difficult to examine, mild diffuse tenderness  on exam  Musculoskeletal:     Cervical back: Normal range of motion.     Comments: Unstageable upper lumbar and lower thoracic ulcer with some surrounding cellulitis.  There is a stage II right heel ulcer that is documented previously with no surrounding cellulitis  Skin:    General: Skin is warm.     Capillary Refill: Capillary refill takes less than 2 seconds.  Neurological:     General: No focal deficit present.     Comments: Patient is unable to ambulate and this is baseline.  Patient has normal reflexes bilateral legs  Psychiatric:        Mood and Affect: Mood normal.     ED Results / Procedures / Treatments   Labs (all labs ordered are listed, but only abnormal results are displayed) Labs Reviewed  CBC WITH  DIFFERENTIAL/PLATELET - Abnormal; Notable for the following components:      Result Value   WBC 11.1 (*)    RDW 16.8 (*)    Neutro Abs 9.0 (*)    All other components within normal limits  LACTIC ACID, PLASMA - Abnormal; Notable for the following components:   Lactic Acid, Venous 3.0 (*)    All other components within normal limits  LACTIC ACID, PLASMA - Abnormal; Notable for the following components:   Lactic Acid, Venous 2.0 (*)    All other components within normal limits  COMPREHENSIVE METABOLIC PANEL - Abnormal; Notable for the following components:   Chloride 119 (*)    CO2 19 (*)    Calcium 6.3 (*)    Total Protein 5.3 (*)    Albumin 1.5 (*)    AST 75 (*)    ALT 58 (*)    Total Bilirubin 2.0 (*)    All other components within normal limits  RESP PANEL BY RT-PCR (FLU A&B, COVID) ARPGX2  CULTURE, BLOOD (ROUTINE X 2)  CULTURE, BLOOD (ROUTINE X 2)  I-STAT CHEM 8, ED    EKG None  Radiology CT L-SPINE NO CHARGE  Result Date: 07/21/2020 CLINICAL DATA:  Previous spinal surgery with poorly healing wound. Metal exposure 2 physical exam. EXAM: CT LUMBAR SPINE WITHOUT CONTRAST TECHNIQUE: Multidetector CT imaging of the lumbar spine was performed without intravenous contrast administration. Multiplanar CT image reconstructions were also generated. COMPARISON:  CT 05/09/2020 FINDINGS: Segmentation: 5 lumbar type vertebral bodies assumed. Alignment: Fixed scoliotic curvature convex to the left. Kyphotic curvature of the spine at T12-L1. Vertebrae: Previous fusion procedure from L1 to the sacrum with pedicle screws and posterior rods. Pedicle screws on the right at L1, L3, L4, L5 and S1. Pedicle screws on the left at L1, L2, L3, L5 and S1. There appears to be solid union throughout the region. No evidence of hardware fracture or discontinuity. Left-sided screw at L5 enters the disc space, but this does not appear to be associated with any unfavorable finding. Left-sided screw at L2 enters  the disc space but this does not appear to be associated with any unfavorable finding. Paraspinal and other soft tissues: Posterior soft tissue skin breakdown posterior to the region of fusion. Based on the clinical history of hardware being visible, I assume this is related to the right bracket at L1. The left bracket at L1 is just beneath the skin but not apparently associated with an overlying skin defect. No evidence of frank soft tissue abscess. Disc levels: As noted above, there appears to be solid union from L1 to the sacrum. Sufficient patency of the canal and  foramina. IMPRESSION: Posterior soft tissue skin breakdown posterior to the region of fusion. Based on the clinical history of hardware being visible, I assume this is related to the right bracket at L1. The left bracket at L1 is just beneath the skin but not apparently associated with an overlying skin defect. No evidence of frank soft tissue abscess. Electronically Signed   By: Paulina Fusi M.D.   On: 07/21/2020 16:52    Procedures Procedures (including critical care time)  CRITICAL CARE Performed by: Richardean Canal   Total critical care time: 30 minutes  Critical care time was exclusive of separately billable procedures and treating other patients.  Critical care was necessary to treat or prevent imminent or life-threatening deterioration.  Critical care was time spent personally by me on the following activities: development of treatment plan with patient and/or surrogate as well as nursing, discussions with consultants, evaluation of patient's response to treatment, examination of patient, obtaining history from patient or surrogate, ordering and performing treatments and interventions, ordering and review of laboratory studies, ordering and review of radiographic studies, pulse oximetry and re-evaluation of patient's condition.   Medications Ordered in ED Medications  sodium chloride 0.9 % bolus 1,000 mL (0 mLs Intravenous  Stopped 07/21/20 1520)  HYDROmorphone (DILAUDID) injection 1 mg (1 mg Intravenous Given 07/21/20 1405)  vancomycin (VANCOCIN) IVPB 1000 mg/200 mL premix (0 mg Intravenous Stopped 07/21/20 1520)  ceFEPIme (MAXIPIME) 2 g in sodium chloride 0.9 % 100 mL IVPB (0 g Intravenous Stopped 07/21/20 1415)  calcium gluconate 1 g/ 50 mL sodium chloride IVPB (0 g Intravenous Stopped 07/21/20 1705)  iohexol (OMNIPAQUE) 300 MG/ML solution 100 mL (100 mLs Intravenous Contrast Given 07/21/20 1619)  sodium chloride (PF) 0.9 % injection (  Contrast Given 07/21/20 1620)    ED Course  I have reviewed the triage vital signs and the nursing notes.  Pertinent labs & imaging results that were available during my care of the patient were reviewed by me and considered in my medical decision making (see chart for details).    MDM Rules/Calculators/A&P                         [name] is a 79 y.o. female who presented with back pain from unstageable ulcer.  This is a known problem.  I got worse recently.  Patient is sent here from wound care center.  Patient is in palliative care and was managed conservatively with pain medicine right now.  Patient does have some abdominal tenderness as well so we will get a CT to rule out abscess.  Patient is a poor surgical candidate so likely will need IV pain medicine and IV antibiotics for now.  Patient will need admission.   5:12 PM Calcium 6.3. Lactate was 3 and went down to 2. CT showed no collectable abscess. Given broad spectrum abx. Will admit for sepsis from large lumbar ulcer.    Final Clinical Impression(s) / ED Diagnoses Final diagnoses:  Back pain    Rx / DC Orders ED Discharge Orders    None       Charlynne Pander, MD 07/21/20 [phone]

## 2020-07-21 NOTE — H&P (Addendum)
History and Physical  [name] IRJ:[id number] DOB: Aug 26, 1940 DOA: 07/21/2020  Referring physician: Dr. Chaney Malling PCP: [name], MD   Patient coming from: Home   Chief Complaint: Worsening wound  HPI: [name] is a 79 y.o. 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  Last hospitalization from 10/13-10/19 for AKI and prior hospitalization for syncope on 9/24 and found to have saddle PE with cor pulmonale treated with TPA complicated by left upper extremity hematoma requiring transfusion, right hip cellulitis treated with a vancomycin and E. coli treated with Keflex with IVC filter placed on 9/29  History obtained from son as patient has dementia and is a poor historian.     Spoke with son Mr. Sherrie George. States when she left [facility] 3 weeks ago for PE and DVT she left with a "bandaid" that he thought was for support.  He knew she had a wound but didn't know what type of wound, he thought it was associated with her IV.  2 days after coming home they removed her band-aid and saw the ulcer in her back.  Her PCP saw the skin breakdown via telehealth. They have been having wound nurse coming to treat--antibiotics that she was getting 4 times a day can't remember the name but she was on it for 2 weeks, last dose was about "4 or 5 days ago.  He states she had no fevers, no chills, her BP was monitored every night and it was stable  A few days ago  his wife who has been taking care of her wounds ( she is a nurse)noticed the metal being exposed. They called her primary care doctor and to get a referral to the wound center for 12/28.   She was seen by the wound care center today and when the physician saw the wound they advised her to come to the hospital.  Per chart review [facility] Note states has had chronic thoracic spine decubitus ulcer that was unstageable with separation of eschar on evaluation at 07/04/2020. At the time patient was unable to travel to wound care center and was receiving Santyl application with wet-to-dry dressing changes at home  Regarding her dementia at baseline per her son she knows her name, where she lives, her family   ED Course: T max 98, RR 24  Review of Systems:As mentioned in the history of present illness.Review of systems are otherwise negative Patient seen in the ED .   Past Medical History:  Diagnosis Date  . Arthritis   . Asthma   . Chronic back pain   . Depression   . History of pulmonary embolus (PE)   . Hx-TIA (transient ischemic attack)   . Hyperlipemia   . Hypertension   . Personal history of DVT (deep vein thrombosis)   . Scoliosis   . Sleep apnea    cpap - settingsi at 2   . Stroke (HCC)   . 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 ARTHROSCOPIC REPAIR  ACL  02/03/2004  . IR IVC FILTER PLMT / S&I /IMG GUID/MOD SED  05/10/2020  . LEFT HEART CATH AND CORONARY ANGIOGRAPHY N/A 09/16/2019   Procedure: LEFT HEART CATH AND CORONARY ANGIOGRAPHY;  Surgeon: Yvonne Kendall, MD;  Location: [facility] INVASIVE CV LAB;  Service: Cardiovascular;  Laterality: N/A;  . LOOP RECORDER INSERTION N/A 02/21/2020   Procedure: LOOP RECORDER INSERTION;  Surgeon: Duke Salvia, MD;  Location: [facility] INVASIVE CV LAB;  Service: Cardiovascular;  Laterality: N/A;  . OOPHORECTOMY  1970  . SPINE SURGERY  03/31/2006  . TOTAL KNEE ARTHROPLASTY Left 05/17/2016    Procedure: LEFT TOTAL KNEE ARTHROPLASTY;  Surgeon: Kathryne Hitch, MD;  Location: [facility] ORS;  Service: Orthopedics;  Laterality: Left;  Adductor Block; Spinal to General  . TUBAL LIGATION  1983   Allergies  Allergen Reactions  . T-Pa [Alteplase] Swelling    Does okay with it if premedicated with benadryl.  . Codeine Itching   Social History:  reports that she quit smoking about 46 years ago. Her smoking use included cigarettes. She has a 10.00 pack-year smoking history. She has never used smokeless tobacco. She reports that she does not drink alcohol and does not use drugs. Family History  Problem Relation Age of Onset  . Other Father        kidney problems      Prior to Admission medications   Medication Sig Start Date End Date Taking? Authorizing Provider  amLODipine (NORVASC) 10 MG tablet Take 10 mg by mouth daily.   Yes [provider]  apixaban (ELIQUIS) 5 MG TABS tablet Take 1 tablet (5 mg total) by mouth 2 (two) times daily. 05/30/20  Yes Swayze, Ava, DO  atorvastatin (LIPITOR) 20 MG tablet Take 1 tablet (20 mg total) by mouth at bedtime. 03/09/20  Yes Angiulli, Mcarthur Rossetti, PA-C  busPIRone (BUSPAR) 30 MG tablet Take 1 tablet (30 mg total) by mouth 2 (two) times daily. Patient taking differently: Take 15 mg by mouth 2 (two) times daily. 04/11/20  Yes Melony Overly T, PA-C  cholecalciferol (VITAMIN D3) 25 MCG (1000 UNIT) tablet Take 1 tablet (1,000 Units total) by mouth daily. 03/09/20  Yes Angiulli, Mcarthur Rossetti, PA-C  cycloSPORINE (RESTASIS) 0.05 % ophthalmic emulsion Place 1 drop into both eyes daily as needed (dry eyes). 02/21/20  Yes Layne Benton, NP  donepezil (ARICEPT) 10 MG tablet Take 1 tablet (10 mg total) by mouth at bedtime. 03/09/20  Yes Angiulli, Mcarthur Rossetti, PA-C  dronabinol (MARINOL) 2.5 MG capsule Take 1 capsule (2.5 mg total) by mouth 2 (two) times daily before lunch and supper. 05/30/20  Yes Swayze, Ava, DO  feeding supplement (ENSURE ENLIVE / ENSURE PLUS) LIQD  Take 237 mLs by mouth 3 (three) times daily between meals. 05/30/20  Yes Swayze, Ava, DO  fexofenadine (ALLEGRA) 180 MG tablet Take 180 mg by mouth daily.   Yes [provider]  furosemide (LASIX) 40 MG tablet Take 40 mg by mouth daily as needed for fluid. 06/23/20  Yes [provider]  LINZESS 145 MCG CAPS capsule Take 145 mcg by mouth daily. 04/16/20  Yes [provider]  lisinopril (ZESTRIL) 5 MG tablet Take 5 mg by mouth daily.   Yes [provider]  memantine (NAMENDA XR) 28 MG CP24 24 hr capsule Take 1 capsule (28 mg total) by mouth daily. Patient taking differently: Take 28 mg by mouth at bedtime. 03/09/20  Yes Angiulli, Mcarthur Rossetti, PA-C  morphine (ROXANOL) 20 MG/ML concentrated solution Take  0.25 mLs (5 mg total) by mouth every 4 (four) hours as needed for severe pain or shortness of breath. May give sublingually if needed. 05/30/20  Yes Swayze, Ava, DO  Multiple Vitamins-Minerals (MULTI-VITAMIN GUMMIES PO) Take 1 each by mouth daily.    Yes [provider]  nitroGLYCERIN (NITROSTAT) 0.4 MG SL tablet Place 1 tablet (0.4 mg total) under the tongue every 5 (five) minutes as needed for chest pain. 09/17/19  Yes Duke, Roe Rutherford, PA  oxyCODONE (ROXICODONE) 15 MG immediate release tablet Take 1 tablet (15 mg total) by mouth 4 (four) times daily as needed (chronic pain). Patient taking differently: Take 7.5-15 mg by mouth See admin instructions. Take 0.5 tablet (7.5 mg totally) by mouth every 4 hours; if no relief, take 1 tablet (15 mg totally) by mouth 4 times daily 03/09/20  Yes Angiulli, Mcarthur Rossetti, PA-C  polyethylene glycol (MIRALAX / GLYCOLAX) 17 g packet Take 17 g by mouth 2 (two) times daily. Patient taking differently: Take 17 g by mouth daily as needed for mild constipation. 03/09/20  Yes Angiulli, Mcarthur Rossetti, PA-C  zinc gluconate 50 MG tablet Take 50 mg by mouth daily.   Yes [provider]  artificial tears (LACRILUBE) OINT ophthalmic ointment  Place into the left eye at bedtime. Apply left eye bedtime Patient not taking: Reported on 07/21/2020 03/10/20   Angiulli, Mcarthur Rossetti, PA-C  aspirin EC 81 MG EC tablet Take 1 tablet (81 mg total) by mouth daily. Patient not taking: No sig reported 09/18/19   Marcelino Duster, PA  Calcium Carbonate Antacid (CALCIUM CARBONATE, DOSED IN MG ELEMENTAL CALCIUM,) 1250 MG/5ML SUSP Take 5 mLs (500 mg of elemental calcium total) by mouth every 6 (six) hours as needed for indigestion. Patient not taking: No sig reported 05/30/20   Swayze, Ava, DO  camphor-menthol (SARNA) lotion Apply 1 application topically every 8 (eight) hours as needed for itching. Patient not taking: No sig reported 05/30/20   Swayze, Ava, DO  docusate sodium (COLACE) 100 MG capsule Take 1 capsule (100 mg total) by mouth 2 (two) times daily. Patient not taking: No sig reported 05/30/20   Swayze, Ava, DO  docusate sodium (ENEMEEZ) 283 MG enema Place 1 enema (283 mg total) rectally as needed for severe constipation. Patient not taking: No sig reported 05/30/20   Swayze, Ava, DO  hydrOXYzine (ATARAX/VISTARIL) 25 MG tablet Take 1 tablet (25 mg total) by mouth every 8 (eight) hours as needed for itching. Patient not taking: No sig reported 05/30/20   Swayze, Ava, DO  hyoscyamine (LEVSIN SL) 0.125 MG SL tablet Place 1 tablet (0.125 mg total) under the tongue every 4 (four) hours as needed (excess oral secretions). Patient not taking: No sig reported 05/30/20   Swayze, Ava, DO  ipratropium-albuterol (DUONEB) 0.5-2.5 (3) MG/3ML SOLN Take 3 mLs by nebulization every 4 (four) hours as needed (shortness of breath.). Patient not taking: No sig reported 05/30/20   Swayze, Ava, DO  LORazepam (ATIVAN) 0.5 MG tablet Take 1 tablet (0.5 mg total) by mouth every 4 (four) hours as needed for anxiety. May crush, mix with water and give sublingually if needed. Patient not taking: No sig reported 05/30/20   Swayze, Ava, DO  melatonin 3 MG TABS tablet Take 1  tablet (3 mg total) by mouth at bedtime. Patient not taking: No sig reported 03/09/20   Angiulli, Mcarthur Rossetti, PA-C  Nutritional Supplements (FEEDING SUPPLEMENT, NEPRO CARB STEADY,) LIQD Take 237 mLs by mouth 3 (three) times daily as  needed (Supplement). Patient not taking: No sig reported 05/30/20   Swayze, Ava, DO  ondansetron (ZOFRAN) 4 MG tablet Take 1 tablet (4 mg total) by mouth every 6 (six) hours as needed for nausea. Patient not taking: No sig reported 05/30/20   Swayze, Ava, DO  oxyCODONE (OXY IR/ROXICODONE) 5 MG immediate release tablet Take 1 tablet (5 mg total) by mouth every 4 (four) hours as needed for severe pain. May crush, mix with water and give sublingually if needed. Patient not taking: No sig reported 05/30/20   Swayze, Ava, DO  polyvinyl alcohol (LIQUIFILM TEARS) 1.4 % ophthalmic solution Place 2 drops into the left eye 4 (four) times daily. Patient not taking: No sig reported 02/21/20   Layne Benton, NP  prochlorperazine (COMPAZINE) 10 MG tablet Take 1 tablet (10 mg total) by mouth every 4 (four) hours as needed for nausea or vomiting. May crush, mix with water and give sublingually. Patient not taking: No sig reported 05/30/20   Swayze, Ava, DO  senna (SENOKOT) 8.6 MG tablet Take 2 tablets (17.2 mg total) by mouth 2 (two) times daily. May crush, mix with water and give sublingually if needed. Patient not taking: No sig reported 05/30/20   Swayze, Ava, DO  senna-docusate (SENOKOT-S) 8.6-50 MG tablet Take 1 tablet by mouth at bedtime. Patient not taking: No sig reported 05/13/20   Amin, Loura Halt, MD  zolpidem (AMBIEN) 5 MG tablet Take 1 tablet (5 mg total) by mouth at bedtime as needed for sleep (Insomnia). Patient not taking: No sig reported 05/30/20   Swayze, Ava, DO    Physical Exam: BP 127/70 (BP Location: Right Arm)   Pulse 96   Temp 98 F (36.7 C) (Oral)   Resp 19   Ht  (1.575 m)   Wt 83.9 kg   SpO2 96%   BMI 33.84 kg/m   Constitutional chronically  ill-appearing female, in distress, nontoxic-appearing Eyes: EOMI, anicteric, normal conjunctivae Cardiovascular: Tachycardic, 2+ pitting edema right lower extremity from ankle to upper thigh Respiratory: Normal respiratory effort on room air, clear breath sounds  Abdomen: Soft, slightly tender to palpation, no rebound tenderness or guarding Skin:         Neurologic: Grossly no focal neuro deficit. Psychiatric:Appropriate affect, and mood. Alert to self, place (able to state this hospital) and year          Labs on Admission:  Basic Metabolic Panel: Recent Labs  Lab 07/21/20 1403  NA 143  K 4.4  CL 119*  CO2 19*  GLUCOSE 95  BUN 8  CREATININE 0.67  CALCIUM 6.3*   Liver Function Tests: Recent Labs  Lab 07/21/20 1403  AST 75*  ALT 58*  ALKPHOS 46  BILITOT 2.0*  PROT 5.3*  ALBUMIN 1.5*   No results for input(s): LIPASE, AMYLASE in the last 168 hours. No results for input(s): AMMONIA in the last 168 hours. CBC: Recent Labs  Lab 07/21/20 1340  WBC 11.1*  NEUTROABS 9.0*  HGB 12.3  HCT 40.5  MCV 94.4  PLT 386   Cardiac Enzymes: No results for input(s): CKTOTAL, CKMB, CKMBINDEX, TROPONINI in the last 168 hours.  BNP (last 3 results) Recent Labs    09/16/19 1410 05/06/20 0123  BNP 91.2 175.9*    ProBNP (last 3 results) No results for input(s): PROBNP in the last 8760 hours.  CBG: No results for input(s): GLUCAP in the last 168 hours.  Radiological Exams on Admission: CT ABDOMEN PELVIS W CONTRAST  Result Date:  07/21/2020 CLINICAL DATA:  Back pain. Prior lumbar surgery with overlying wound. Abdominal pain. Rule out abscess. EXAM: CT ABDOMEN AND PELVIS WITH CONTRAST TECHNIQUE: Multidetector CT imaging of the abdomen and pelvis was performed using the standard protocol following bolus administration of intravenous contrast. CONTRAST:  OMNIPAQUE IOHEXOL 300 MG/ML  SOLN COMPARISON:  05/09/2020 FINDINGS: Lower chest: Mild atelectasis in the lung  bases. No pleural effusion. Coronary atherosclerosis. Hepatobiliary: A few subcentimeter hypodensities in the liver too small to fully characterize. Moderate gallbladder distension is similar to the prior study as is mild extrahepatic biliary dilatation. Pancreas: Borderline to mild pancreatic ductal dilatation. No peripancreatic inflammation. Spleen: Unremarkable. Adrenals/Urinary Tract: Unremarkable adrenal glands. No evidence of renal mass, calculi, or hydronephrosis. Under distended bladder with appearance of circumferential wall thickening. Stomach/Bowel: The stomach is unremarkable. There is a moderately large amount of stool in the colon and rectum, much greater than on the prior study. There is no evidence of bowel obstruction or inflammation. Vascular/Lymphatic: Abdominal aortic atherosclerosis without aneurysm. New infrarenal IVC filter. Heterogeneously diminished enhancement of the right common femoral vein with question of a central filling defect and partially visualized asymmetric right thigh soft tissue edema. Reproductive: Suspected small calcified uterine fibroid. No adnexal mass. Other: No intraperitoneal free fluid, fluid collection, or pneumoperitoneum. Musculoskeletal: Scoliosis and postoperative changes in the lumbar spine with overlying skin breakdown, evaluated in detail on the separate lumbar spine CT. Old right-sided pubic ramus fractures. IMPRESSION: 1. Possible deep venous thrombosis in the right common femoral vein with asymmetric right thigh soft tissue edema. There was DVT more distally in the right leg on a 05/06/2020 ultrasound. 2. Suspected circumferential bladder wall thickening despite underdistention. Correlate for cystitis. 3. No evidence of intra-abdominal abscess. Lumbar spine reported separately. 4. Moderately large colonic stool burden. No evidence of bowel obstruction. 5. Aortic Atherosclerosis (ICD10-I70.0). Electronically Signed   By: Sebastian Ache M.D.   On: 07/21/2020  17:37   CT L-SPINE NO CHARGE  Result Date: 07/21/2020 CLINICAL DATA:  Previous spinal surgery with poorly healing wound. Metal exposure 2 physical exam. EXAM: CT LUMBAR SPINE WITHOUT CONTRAST TECHNIQUE: Multidetector CT imaging of the lumbar spine was performed without intravenous contrast administration. Multiplanar CT image reconstructions were also generated. COMPARISON:  CT 05/09/2020 FINDINGS: Segmentation: 5 lumbar type vertebral bodies assumed. Alignment: Fixed scoliotic curvature convex to the left. Kyphotic curvature of the spine at T12-L1. Vertebrae: Previous fusion procedure from L1 to the sacrum with pedicle screws and posterior rods. Pedicle screws on the right at L1, L3, L4, L5 and S1. Pedicle screws on the left at L1, L2, L3, L5 and S1. There appears to be solid union throughout the region. No evidence of hardware fracture or discontinuity. Left-sided screw at L5 enters the disc space, but this does not appear to be associated with any unfavorable finding. Left-sided screw at L2 enters the disc space but this does not appear to be associated with any unfavorable finding. Paraspinal and other soft tissues: Posterior soft tissue skin breakdown posterior to the region of fusion. Based on the clinical history of hardware being visible, I assume this is related to the right bracket at L1. The left bracket at L1 is just beneath the skin but not apparently associated with an overlying skin defect. No evidence of frank soft tissue abscess. Disc levels: As noted above, there appears to be solid union from L1 to the sacrum. Sufficient patency of the canal and foramina. IMPRESSION: Posterior soft tissue skin breakdown posterior to the region  of fusion. Based on the clinical history of hardware being visible, I assume this is related to the right bracket at L1. The left bracket at L1 is just beneath the skin but not apparently associated with an overlying skin defect. No evidence of frank soft tissue  abscess. Electronically Signed   By: Paulina Fusi M.D.   On: 07/21/2020 16:52     Assessment/Plan Present on Admission: . Severe sepsis (HCC) . Benign essential HTN . Dementia (HCC) . Depression . Pulmonary embolism (HCC) . Transaminitis . Hypocalcemia  Active Problems:   Right leg swelling   Depression   History of DVT (deep vein thrombosis)   Dementia (HCC)   Benign essential HTN   Pulmonary embolism (HCC)   Transaminitis   Severe sepsis (HCC)   Hypocalcemia   Severe Sepsis secondary Unstageable ulcer of the distal thoracic/early lumbar spine ( present on admission). Presented with leukocytosis, tachycardia, lactic acidosis of 3 with likely source being open unstageable ulcer at lumbar spine with exposed metal, CT lumbar spine shows no evidence of abscess. Has significant amount of pain while laying on back. Status post fluid bolus in ED and initiation of empiric antibiotics -Pain control IV morphine 1 mg as needed severe pain, continue home oxycodone IR 7.5 mg 4 times daily as needed -Continue empiric vancomycin, cefepime -Monitor blood cultures -Trend lactic acid -Continue IV fluids -Monitor on telemetry --Wound care consulted  Increasing right leg edema with a history of prior DVTs/PE. Patient has IVC filter in has been adherent with Eliquis per family. CT abdomen imaging concerning for worsening edema that may be related to new clot burden with right common femoral vein. -Repeat venous duplex this admission -Continue home Eliquis  Hypocalcemia. Albumin is very low too so may not be quite as low as it seems -Check Mag  Mild elevation in LFTs. Could be correlated with sepsis physiology -Trend CMP remains elevated can check hepatitis panel -Hold home statin  Incidental finding of bladder wall thickening. UA unremarkable -continue to monitor  Moderately large colonic stool burden without evidence of bowel obstruction is noted on CT abdomen. In the setting of opioid  regimen -Optimize bowel regimen with scheduled stool softeners and MiraLAX and closely monitor  History of submassive bilateral saddle PE and right DVT ( 9/20201). Status post IVC filter, adherent to Eliquis at home per son. Stable on room air.  No concern for bleeding currently -Continue Eliquis -Obtain venous duplex given concern for worsening right lower extremity edema and possible proximal DVT based on CT imaging (DVT in (/2021 was more proximal)  Hypertension. Blood pressure at goal. Given concurrent sepsis physiology will hold off on home blood pressure regimen -Holding home BP meds  Dementia with behavior disturbances, stable. Alert to self and place and year. -Continue BuSpar, Aricept,, Namenda delirium precautions    DVT prophylaxis: home eliquis  Code Status: Limited Code. Ok with CPR but no intubation in discussion with patient's son on day of admission on the phone   Family Communication: updated son by hpone  Disposition Plan: needs IV antibiotics given sepsis from ulcer   Consults called: wound nursing team   Admission status: Admitted as inpatient to progressive      Laverna Peace MD [facility]   If 7PM-7AM, please contact night-coverage [email or web address] Password [email or web address]  07/21/2020, 9:15 PM

## 2020-07-21 NOTE — ED Notes (Signed)
Pt states her pain isn't getting better, provider made aware.

## 2020-07-21 NOTE — ED Notes (Signed)
Date and time results received: 07/21/20 1423 (use smartphrase ".now" to insert current time)  Test: lactic acid Critical Value: 3.0  Name of Provider Notified: DR Silverio Lay  Orders Received? Or Actions Taken?:

## 2020-07-21 NOTE — ED Triage Notes (Signed)
Patient went to wound clinic for wound re-check on back from surgery. Per ems metal is exposed and wound MD sent for admission per EMS.

## 2020-07-21 NOTE — H&P (Incomplete)
History and Physical  [name] TZG:[id number] DOB: Dec 22, 1940 DOA: 07/21/2020  Referring physician: ***  PCP: [name], MD  Outpatient Specialists: *** Patient coming from: Home   Chief Complaint: Worsening wound  HPI: [name] is a 79 y.o. 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  Last hospitalization from 10/13-10/19 for AKI and prior hospitalization for syncope on 9/24 and found to have saddle PE with cor pulmonale treated with TPA complicated by left upper extremity hematoma requiring transfusion, right hip cellulitis treated with a vancomycin and E. coli treated with Keflex with IVC filter placed on 9/29  History obtained from son as patient has dementia and is a poor historian.     Spoke with son Mr. Sherrie George. States when she left [facility] 3 weeks ago for PE and DVT she left with a "bandaid" that he thought was for support.  He knew she had a wound but didn't know what type of wound, he thought it was associated with her IV.  2 days after coming home they removed her band-aid and saw the ulcer in her back.  Her PCP saw the skin breakdown via telehealth. They have been having wound nurse coming to treat--antibiotics that she was getting 4 times a day can't remember the name but she was on it for 2 weeks, last dose was about "4 or 5 days ago.  He states she had no fevers, no chills, her BP was monitored every night and it was  stable  A few days ago his wife who has been taking care of her wounds ( she is a nurse)noticed the metal being exposed. They called her primary care doctor and to get a referral to the wound center for 12/28.   She was seen by the wound care center today and when the physician saw the wound they advised her to come to the hospital.  Per chart review [facility] Note states has had chronic thoracic spine decubitus ulcer that was unstageable with separation of eschar on evaluation at 07/04/2020. At the time patient was unable to travel to wound care center and was receiving Santyl application with wet-to-dry dressing changes at home  Regarding her dementia at baseline per her son she knows her name, where she lives, her family   ED Course: T max 98, RR 24  Review of Systems:As mentioned in the history of present illness.Review of systems are otherwise negative Patient seen in the ED .   Past Medical History:  Diagnosis Date  . Arthritis   . Asthma   . Chronic back pain   . Depression   . History of pulmonary embolus (PE)   . Hx-TIA (transient ischemic attack)   . Hyperlipemia   . Hypertension   . Personal history of DVT (deep vein thrombosis)   . Scoliosis   . Sleep apnea    cpap - settingsi at 2   . Stroke (HCC)   . 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 ARTHROSCOPIC  REPAIR ACL  02/03/2004  . IR IVC FILTER PLMT / S&I /IMG GUID/MOD SED  05/10/2020  . LEFT HEART CATH AND CORONARY ANGIOGRAPHY N/A 09/16/2019   Procedure: LEFT HEART CATH AND CORONARY ANGIOGRAPHY;  Surgeon: Yvonne Kendall, MD;  Location: [facility] INVASIVE CV LAB;  Service: Cardiovascular;  Laterality: N/A;  . LOOP RECORDER INSERTION N/A 02/21/2020   Procedure: LOOP RECORDER INSERTION;  Surgeon: Duke Salvia, MD;  Location: [facility] INVASIVE CV LAB;  Service: Cardiovascular;  Laterality: N/A;  . OOPHORECTOMY  1970  . SPINE SURGERY  03/31/2006  . TOTAL KNEE  ARTHROPLASTY Left 05/17/2016   Procedure: LEFT TOTAL KNEE ARTHROPLASTY;  Surgeon: Kathryne Hitch, MD;  Location: [facility] ORS;  Service: Orthopedics;  Laterality: Left;  Adductor Block; Spinal to General  . TUBAL LIGATION  1983   Allergies  Allergen Reactions  . T-Pa [Alteplase] Swelling    Does okay with it if premedicated with benadryl.  . Codeine Itching   Social History:  reports that she quit smoking about 46 years ago. Her smoking use included cigarettes. She has a 10.00 pack-year smoking history. She has never used smokeless tobacco. She reports that she does not drink alcohol and does not use drugs. Family History  Problem Relation Age of Onset  . Other Father        kidney problems      Prior to Admission medications   Medication Sig Start Date End Date Taking? Authorizing Provider  amLODipine (NORVASC) 10 MG tablet Take 10 mg by mouth daily.   Yes [provider]  apixaban (ELIQUIS) 5 MG TABS tablet Take 1 tablet (5 mg total) by mouth 2 (two) times daily. 05/30/20  Yes Swayze, Ava, DO  atorvastatin (LIPITOR) 20 MG tablet Take 1 tablet (20 mg total) by mouth at bedtime. 03/09/20  Yes Angiulli, Mcarthur Rossetti, PA-C  busPIRone (BUSPAR) 30 MG tablet Take 1 tablet (30 mg total) by mouth 2 (two) times daily. Patient taking differently: Take 15 mg by mouth 2 (two) times daily. 04/11/20  Yes Melony Overly T, PA-C  cholecalciferol (VITAMIN D3) 25 MCG (1000 UNIT) tablet Take 1 tablet (1,000 Units total) by mouth daily. 03/09/20  Yes Angiulli, Mcarthur Rossetti, PA-C  cycloSPORINE (RESTASIS) 0.05 % ophthalmic emulsion Place 1 drop into both eyes daily as needed (dry eyes). 02/21/20  Yes Layne Benton, NP  donepezil (ARICEPT) 10 MG tablet Take 1 tablet (10 mg total) by mouth at bedtime. 03/09/20  Yes Angiulli, Mcarthur Rossetti, PA-C  dronabinol (MARINOL) 2.5 MG capsule Take 1 capsule (2.5 mg total) by mouth 2 (two) times daily before lunch and supper. 05/30/20  Yes Swayze, Ava, DO  feeding supplement  (ENSURE ENLIVE / ENSURE PLUS) LIQD Take 237 mLs by mouth 3 (three) times daily between meals. 05/30/20  Yes Swayze, Ava, DO  fexofenadine (ALLEGRA) 180 MG tablet Take 180 mg by mouth daily.   Yes [provider]  furosemide (LASIX) 40 MG tablet Take 40 mg by mouth daily as needed for fluid. 06/23/20  Yes [provider]  LINZESS 145 MCG CAPS capsule Take 145 mcg by mouth daily. 04/16/20  Yes [provider]  lisinopril (ZESTRIL) 5 MG tablet Take 5 mg by mouth daily.   Yes [provider]  memantine (NAMENDA XR) 28 MG CP24 24 hr capsule Take 1 capsule (28 mg total) by mouth daily. Patient taking differently: Take 28 mg by mouth at bedtime. 03/09/20  Yes Angiulli, Mcarthur Rossetti, PA-C  morphine (ROXANOL) 20 MG/ML concentrated solution  Take 0.25 mLs (5 mg total) by mouth every 4 (four) hours as needed for severe pain or shortness of breath. May give sublingually if needed. 05/30/20  Yes Swayze, Ava, DO  Multiple Vitamins-Minerals (MULTI-VITAMIN GUMMIES PO) Take 1 each by mouth daily.    Yes [provider]  nitroGLYCERIN (NITROSTAT) 0.4 MG SL tablet Place 1 tablet (0.4 mg total) under the tongue every 5 (five) minutes as needed for chest pain. 09/17/19  Yes Duke, Roe Rutherford, PA  oxyCODONE (ROXICODONE) 15 MG immediate release tablet Take 1 tablet (15 mg total) by mouth 4 (four) times daily as needed (chronic pain). Patient taking differently: Take 7.5-15 mg by mouth See admin instructions. Take 0.5 tablet (7.5 mg totally) by mouth every 4 hours; if no relief, take 1 tablet (15 mg totally) by mouth 4 times daily 03/09/20  Yes Angiulli, Mcarthur Rossetti, PA-C  polyethylene glycol (MIRALAX / GLYCOLAX) 17 g packet Take 17 g by mouth 2 (two) times daily. Patient taking differently: Take 17 g by mouth daily as needed for mild constipation. 03/09/20  Yes Angiulli, Mcarthur Rossetti, PA-C  zinc gluconate 50 MG tablet Take 50 mg by mouth daily.   Yes [provider]  artificial tears  (LACRILUBE) OINT ophthalmic ointment Place into the left eye at bedtime. Apply left eye bedtime Patient not taking: Reported on 07/21/2020 03/10/20   Angiulli, Mcarthur Rossetti, PA-C  aspirin EC 81 MG EC tablet Take 1 tablet (81 mg total) by mouth daily. Patient not taking: No sig reported 09/18/19   Marcelino Duster, PA  Calcium Carbonate Antacid (CALCIUM CARBONATE, DOSED IN MG ELEMENTAL CALCIUM,) 1250 MG/5ML SUSP Take 5 mLs (500 mg of elemental calcium total) by mouth every 6 (six) hours as needed for indigestion. Patient not taking: No sig reported 05/30/20   Swayze, Ava, DO  camphor-menthol (SARNA) lotion Apply 1 application topically every 8 (eight) hours as needed for itching. Patient not taking: No sig reported 05/30/20   Swayze, Ava, DO  docusate sodium (COLACE) 100 MG capsule Take 1 capsule (100 mg total) by mouth 2 (two) times daily. Patient not taking: No sig reported 05/30/20   Swayze, Ava, DO  docusate sodium (ENEMEEZ) 283 MG enema Place 1 enema (283 mg total) rectally as needed for severe constipation. Patient not taking: No sig reported 05/30/20   Swayze, Ava, DO  hydrOXYzine (ATARAX/VISTARIL) 25 MG tablet Take 1 tablet (25 mg total) by mouth every 8 (eight) hours as needed for itching. Patient not taking: No sig reported 05/30/20   Swayze, Ava, DO  hyoscyamine (LEVSIN SL) 0.125 MG SL tablet Place 1 tablet (0.125 mg total) under the tongue every 4 (four) hours as needed (excess oral secretions). Patient not taking: No sig reported 05/30/20   Swayze, Ava, DO  ipratropium-albuterol (DUONEB) 0.5-2.5 (3) MG/3ML SOLN Take 3 mLs by nebulization every 4 (four) hours as needed (shortness of breath.). Patient not taking: No sig reported 05/30/20   Swayze, Ava, DO  LORazepam (ATIVAN) 0.5 MG tablet Take 1 tablet (0.5 mg total) by mouth every 4 (four) hours as needed for anxiety. May crush, mix with water and give sublingually if needed. Patient not taking: No sig reported 05/30/20   Swayze, Ava, DO   melatonin 3 MG TABS tablet Take 1 tablet (3 mg total) by mouth at bedtime. Patient not taking: No sig reported 03/09/20   Angiulli, Mcarthur Rossetti, PA-C  Nutritional Supplements (FEEDING SUPPLEMENT, NEPRO CARB STEADY,) LIQD Take 237 mLs by mouth 3 (three) times daily  as needed (Supplement). Patient not taking: No sig reported 05/30/20   Swayze, Ava, DO  ondansetron (ZOFRAN) 4 MG tablet Take 1 tablet (4 mg total) by mouth every 6 (six) hours as needed for nausea. Patient not taking: No sig reported 05/30/20   Swayze, Ava, DO  oxyCODONE (OXY IR/ROXICODONE) 5 MG immediate release tablet Take 1 tablet (5 mg total) by mouth every 4 (four) hours as needed for severe pain. May crush, mix with water and give sublingually if needed. Patient not taking: No sig reported 05/30/20   Swayze, Ava, DO  polyvinyl alcohol (LIQUIFILM TEARS) 1.4 % ophthalmic solution Place 2 drops into the left eye 4 (four) times daily. Patient not taking: No sig reported 02/21/20   Layne Benton, NP  prochlorperazine (COMPAZINE) 10 MG tablet Take 1 tablet (10 mg total) by mouth every 4 (four) hours as needed for nausea or vomiting. May crush, mix with water and give sublingually. Patient not taking: No sig reported 05/30/20   Swayze, Ava, DO  senna (SENOKOT) 8.6 MG tablet Take 2 tablets (17.2 mg total) by mouth 2 (two) times daily. May crush, mix with water and give sublingually if needed. Patient not taking: No sig reported 05/30/20   Swayze, Ava, DO  senna-docusate (SENOKOT-S) 8.6-50 MG tablet Take 1 tablet by mouth at bedtime. Patient not taking: No sig reported 05/13/20   Amin, Loura Halt, MD  zolpidem (AMBIEN) 5 MG tablet Take 1 tablet (5 mg total) by mouth at bedtime as needed for sleep (Insomnia). Patient not taking: No sig reported 05/30/20   Swayze, Ava, DO    Physical Exam: BP (!) 142/68   Pulse 100   Temp 98 F (36.7 C) (Oral)   Resp 20   Ht 5\' 2"  (1.575 m)   Wt 83.9 kg   SpO2 97%   BMI 33.84 kg/m    Constitutional chronically ill-appearing female, in distress, nontoxic-appearing Eyes: EOMI, anicteric, normal conjunctivae Cardiovascular: Tachycardic, 2+ pitting edema right lower extremity from ankle to upper thigh Respiratory: Normal respiratory effort on room air, clear breath sounds  Abdomen: Soft, slightly tender to palpation, no rebound tenderness or guarding Skin: No rash ulcers, or lesions. Without skin tenting  Neurologic: Grossly no focal neuro deficit. Psychiatric:Appropriate affect, and mood. Alert to self, place (able to state this hospital) and year          Labs on Admission:  Basic Metabolic Panel: Recent Labs  Lab 07/21/20 1403  NA 143  K 4.4  CL 119*  CO2 19*  GLUCOSE 95  BUN 8  CREATININE 0.67  CALCIUM 6.3*   Liver Function Tests: Recent Labs  Lab 07/21/20 1403  AST 75*  ALT 58*  ALKPHOS 46  BILITOT 2.0*  PROT 5.3*  ALBUMIN 1.5*   No results for input(s): LIPASE, AMYLASE in the last 168 hours. No results for input(s): AMMONIA in the last 168 hours. CBC: Recent Labs  Lab 07/21/20 1340  WBC 11.1*  NEUTROABS 9.0*  HGB 12.3  HCT 40.5  MCV 94.4  PLT 386   Cardiac Enzymes: No results for input(s): CKTOTAL, CKMB, CKMBINDEX, TROPONINI in the last 168 hours.  BNP (last 3 results) Recent Labs    09/16/19 1410 05/06/20 0123  BNP 91.2 175.9*    ProBNP (last 3 results) No results for input(s): PROBNP in the last 8760 hours.  CBG: No results for input(s): GLUCAP in the last 168 hours.  Radiological Exams on Admission: CT L-SPINE NO CHARGE  Result Date: 07/21/2020 CLINICAL  DATA:  Previous spinal surgery with poorly healing wound. Metal exposure 2 physical exam. EXAM: CT LUMBAR SPINE WITHOUT CONTRAST TECHNIQUE: Multidetector CT imaging of the lumbar spine was performed without intravenous contrast administration. Multiplanar CT image reconstructions were also generated. COMPARISON:  CT 05/09/2020 FINDINGS: Segmentation: 5 lumbar type  vertebral bodies assumed. Alignment: Fixed scoliotic curvature convex to the left. Kyphotic curvature of the spine at T12-L1. Vertebrae: Previous fusion procedure from L1 to the sacrum with pedicle screws and posterior rods. Pedicle screws on the right at L1, L3, L4, L5 and S1. Pedicle screws on the left at L1, L2, L3, L5 and S1. There appears to be solid union throughout the region. No evidence of hardware fracture or discontinuity. Left-sided screw at L5 enters the disc space, but this does not appear to be associated with any unfavorable finding. Left-sided screw at L2 enters the disc space but this does not appear to be associated with any unfavorable finding. Paraspinal and other soft tissues: Posterior soft tissue skin breakdown posterior to the region of fusion. Based on the clinical history of hardware being visible, I assume this is related to the right bracket at L1. The left bracket at L1 is just beneath the skin but not apparently associated with an overlying skin defect. No evidence of frank soft tissue abscess. Disc levels: As noted above, there appears to be solid union from L1 to the sacrum. Sufficient patency of the canal and foramina. IMPRESSION: Posterior soft tissue skin breakdown posterior to the region of fusion. Based on the clinical history of hardware being visible, I assume this is related to the right bracket at L1. The left bracket at L1 is just beneath the skin but not apparently associated with an overlying skin defect. No evidence of frank soft tissue abscess. Electronically Signed   By: Paulina Fusi M.D.   On: 07/21/2020 16:52     Assessment/Plan Present on Admission: **None**  Active Problems:   * No active hospital problems. *   Severe Sepsis secondary Unstageable ulcer of the distal thoracic/early lumbar spine ( present on admission). Presented with leukocytosis, tachycardia, lactic acidosis of 3 with likely source being open unstageable ulcer at lumbar spine with  exposed metal, CT lumbar spine shows no evidence of abscess. Has significant amount of pain while laying on back. Status post fluid bolus in ED and initiation of empiric antibiotics -Pain control IV morphine 1 mg as needed severe pain, continue home oxycodone IR 7.5 mg 4 times daily as needed -Continue empiric vancomycin, cefepime -Monitor blood cultures -Trend lactic acid -Continue IV fluids -Monitor on telemetry  Increasing right leg edema with a history of prior DVTs/PE. Patient has IVC filter in has been adherent with Eliquis per family. CT abdomen imaging concerning for worsening edema that may be related to new clot burden with right common femoral vein. -Repeat venous duplex this admission -Continue home Eliquis  Mild elevation in LFTs. Could be correlated with sepsis physiology -Trend CMP remains elevated can check hepatitis panel -Hold home statin  Incidental finding of bladder wall thickening. UA unremarkable -continue to monitor  Moderately large colonic stool burden without evidence of bowel obstruction is noted on CT abdomen. In the setting of opioid regimen -Optimize bowel regimen with scheduled stool softeners and MiraLAX and closely monitor  History of submassive PE. Status post IVC filter, adherent to Eliquis at home per son. No concern for bleeding currently -Continue Eliquis -Obtain venous duplex given concern for worsening right lower extremity edema and possible  proximal DVT based on CT imaging  Hypertension. Blood pressure at goal. Given concurrent sepsis physiology will hold off on home blood pressure regimen -Holding home BP meds  Dementia with behavior disturbances, stable. Alert to self and place and year. -Continue BuSpar, Aricept,, Namenda delirium precautions    DVT prophylaxis: ***  Code Status: ***   Family Communication: ***Family was present at bedside and updated appropriately at the time of interview.   Disposition Plan: ***   Consults  called: ***   Admission status: *Admitted as observation***inpatient/telemetry/step-down/med-surge unit.      Laverna Peace MD [facility]  Pager [phone]  If 7PM-7AM, please contact night-coverage [email or web address] Password [email or web address]  07/21/2020, 5:14 PM

## 2020-07-21 NOTE — ED Notes (Signed)
Initial contact with pt. Pt Vanc has been started. Pt is c/o pain primary nurse made aware. Pt remains on monitor and is alert and oriented x4. Will continue to monitor.

## 2020-07-21 NOTE — ED Notes (Signed)
Date and time results received: 07/21/20 1450 (use smartphrase ".now" to insert current time)  Test: calcium Critical Value: 6.3  Name of Provider Notified: MD YAO  Orders Received? Or Actions Taken?:

## 2020-07-21 NOTE — ED Notes (Signed)
Pt has bee medicated per MD orders pt pain 10/10 in back ( ulcer). Pt remains on monitor x4. Will continue to monitor.

## 2020-07-21 NOTE — ED Notes (Signed)
Patient transported to CT 

## 2020-07-21 NOTE — Progress Notes (Signed)
Pharmacy Antibiotic Note  [name] is a 79 y.o. female admitted on 07/21/2020 for wound check for lower thoracic and upper lumbar ulcer.  Pharmacy has been consulted for vancomycin and cefepime dosing.  Plan: Cefepime 2 g iv q 12 hours  Vancomycin 1000 mg + 500 mg loading dose followed by 750 mg iv q 24 hours  Will f/u renal function, culture results, and clinical course  Height: 5\' 2"  (157.5 cm) Weight: 83.9 kg (185 lb) IBW/kg (Calculated) : 50.1  Temp (24hrs), Avg:98 F (36.7 C), Min:98 F (36.7 C), Max:98 F (36.7 C)  Recent Labs  Lab 07/21/20 1340 07/21/20 1403 07/21/20 1529  WBC 11.1*  --   --   CREATININE  --  0.67  --   LATICACIDVEN 3.0*  --  2.0*    Estimated Creatinine Clearance: 56.3 mL/min (by C-G formula based on SCr of 0.67 mg/dL).    Allergies  Allergen Reactions  . T-Pa [Alteplase] Swelling    Does okay with it if premedicated with benadryl.  . Codeine Itching    Antimicrobials this admission: 12/10 cefepime >>  12/10 vancomycin >>   Dose adjustments this admission:  Microbiology results: 12/10 BCx:  12/10 resp panel: COVID: neg, FLU: neg  Thank you for allowing pharmacy to be a part of this patient's care.  14/10 D 07/21/2020 6:04 PM

## 2020-07-22 ENCOUNTER — Inpatient Hospital Stay (HOSPITAL_COMMUNITY): Payer: Medicare Other

## 2020-07-22 ENCOUNTER — Other Ambulatory Visit: Payer: Self-pay

## 2020-07-22 ENCOUNTER — Encounter (HOSPITAL_COMMUNITY): Payer: Medicare Other

## 2020-07-22 DIAGNOSIS — Z86718 Personal history of other venous thrombosis and embolism: Secondary | ICD-10-CM

## 2020-07-22 DIAGNOSIS — M7989 Other specified soft tissue disorders: Secondary | ICD-10-CM

## 2020-07-22 LAB — COMPREHENSIVE METABOLIC PANEL
ALT: 67 U/L — ABNORMAL HIGH (ref 0–44)
AST: 54 U/L — ABNORMAL HIGH (ref 15–41)
Albumin: 2.2 g/dL — ABNORMAL LOW (ref 3.5–5.0)
Alkaline Phosphatase: 63 U/L (ref 38–126)
Anion gap: 10 (ref 5–15)
BUN: 11 mg/dL (ref 8–23)
CO2: 25 mmol/L (ref 22–32)
Calcium: 8.9 mg/dL (ref 8.9–10.3)
Chloride: 109 mmol/L (ref 98–111)
Creatinine, Ser: 0.54 mg/dL (ref 0.44–1.00)
GFR, Estimated: >60 mL/min (ref >60)
Glucose, Bld: 93 mg/dL (ref 70–99)
Potassium: 3.7 mmol/L (ref 3.5–5.1)
Sodium: 144 mmol/L (ref 135–145)
Total Bilirubin: 0.5 mg/dL (ref 0.3–1.2)
Total Protein: 6.9 g/dL (ref 6.5–8.1)

## 2020-07-22 LAB — CBC
HCT: 32.4 % — ABNORMAL LOW (ref 36.0–46.0)
Hemoglobin: 9.6 g/dL — ABNORMAL LOW (ref 12.0–15.0)
MCH: 28.5 pg (ref 26.0–34.0)
MCHC: 29.6 g/dL — ABNORMAL LOW (ref 30.0–36.0)
MCV: 96.1 fL (ref 80.0–100.0)
Platelets: 357 10*3/uL (ref 150–400)
RBC: 3.37 MIL/uL — ABNORMAL LOW (ref 3.87–5.11)
RDW: 16.2 % — ABNORMAL HIGH (ref 11.5–15.5)
WBC: 11.6 10*3/uL — ABNORMAL HIGH (ref 4.0–10.5)
nRBC: 0.2 % (ref 0.0–0.2)

## 2020-07-22 LAB — MAGNESIUM: Magnesium: 2.2 mg/dL (ref 1.7–2.4)

## 2020-07-22 MED ORDER — SODIUM CHLORIDE 0.9 % IV SOLN: INTRAVENOUS | Status: AC

## 2020-07-22 NOTE — ED Notes (Signed)
Pt remains in bed states pain is 8/10. Provider made aware.

## 2020-07-22 NOTE — ED Notes (Signed)
Pt is sleeping and resting comfortably. meds infusing

## 2020-07-22 NOTE — Progress Notes (Signed)
Lower extremity venous bilateral study completed.  Preliminary results relayed to Caleb Popp, MD.   See CV Proc for preliminary results report.   Jean Rosenthal, RDMS

## 2020-07-22 NOTE — ED Notes (Signed)
ED TO INPATIENT HANDOFF REPORT  ED Nurse Name and Phone #: [phone]  S Name/Age/Gender [name] 79 y.o. female Room/Bed: WA08/WA08  Code Status   Code Status: Partial Code  Home/SNF/Other Home Patient oriented to: self, place, time and situation Is this baseline? Yes   Triage Complete: Triage complete  Chief Complaint Severe sepsis (HCC) [A41.9, R65.20]  Triage Note Patient went to wound clinic for wound re-check on back from surgery. Per ems metal is exposed and wound MD sent for admission per EMS.     Allergies Allergies  Allergen Reactions  . T-Pa [Alteplase] Swelling    Does okay with it if premedicated with benadryl.  . Codeine Itching    Level of Care/Admitting Diagnosis ED Disposition    ED Disposition Condition Comment   Admit  Hospital Area: [facility] [[id number]]  Level of Care: Progressive [102]  Admit to Progressive based on following criteria: MULTISYSTEM THREATS such as stable sepsis, metabolic/electrolyte imbalance with or without encephalopathy that is responding to early treatment.  May admit patient to [facility] or [facility] if equivalent level of care is available:: No  Covid Evaluation: Confirmed COVID Negative  Diagnosis: Severe sepsis [facility]) [[id number]]  Admitting Physician: Laverna Peace [[id number]]  Attending Physician: Laverna Peace [phone]  Estimated length of stay: past midnight tomorrow  Certification:: I certify this patient will need inpatient services for at least 2 midnights       B Medical/Surgery History Past Medical History:  Diagnosis Date  . Arthritis   . Asthma   . Chronic back pain   . Depression   . History of pulmonary embolus (PE)   . Hx-TIA (transient ischemic attack)   . Hyperlipemia   . Hypertension   . Personal history of DVT (deep vein thrombosis)   . Scoliosis   . Sleep apnea    cpap - settingsi at 2   . Stroke (HCC)   . 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 ARTHROSCOPIC REPAIR ACL  02/03/2004  . IR IVC FILTER PLMT / S&I /IMG GUID/MOD SED  05/10/2020  . LEFT HEART CATH AND CORONARY ANGIOGRAPHY N/A 09/16/2019   Procedure: LEFT HEART CATH AND CORONARY ANGIOGRAPHY;  Surgeon: Yvonne Kendall, MD;  Location: [facility] INVASIVE CV LAB;  Service: Cardiovascular;  Laterality: N/A;  . LOOP RECORDER INSERTION N/A 02/21/2020   Procedure: LOOP RECORDER INSERTION;  Surgeon: Duke Salvia, MD;  Location: [facility] INVASIVE CV LAB;  Service: Cardiovascular;  Laterality: N/A;  . OOPHORECTOMY  1970  . SPINE SURGERY  03/31/2006  . TOTAL KNEE ARTHROPLASTY Left 05/17/2016   Procedure: LEFT TOTAL KNEE ARTHROPLASTY;  Surgeon: Kathryne Hitch, MD;  Location: [facility] ORS;  Service: Orthopedics;  Laterality: Left;  Adductor Block; Spinal to General  . TUBAL LIGATION  1983     A IV Location/Drains/Wounds Patient Lines/Drains/Airways Status    Active Line/Drains/Airways    Name Placement date Placement time Site Days   Peripheral IV 07/21/20 Right Wrist 07/21/20  1343  Wrist  1   Peripheral IV 07/21/20 Right Antecubital 07/21/20  1352  Antecubital  1   External Urinary Catheter 07/22/20  0437  --  less than 1   Wound / Incision (Open or Dehisced) 05/27/20 (MASD) Moisture Associated Skin Damage Perineum Right;Left 05/27/20  0610  Perineum  56          Intake/Output Last 24 hours  Intake/Output Summary (Last 24 hours) at 07/22/2020 1313  Last data filed at 07/21/2020 2033 Gross per 24 hour  Intake 1450 ml  Output --  Net 1450 ml    Labs/Imaging Results for orders placed or performed during the hospital encounter of 07/21/20 (from the past 48 hour(s))  CBC with Differential/Platelet     Status: Abnormal   Collection Time: 07/21/20  1:40 PM  Result Value Ref Range   WBC 11.1 (H) 4.0 - 10.5 K/uL   RBC 4.29 3.87 - 5.11 MIL/uL   Hemoglobin 12.3 12.0 - 15.0 g/dL   HCT 44.0 34.7 - 42.5 %   MCV 94.4 80.0 - 100.0 fL   MCH 28.7  26.0 - 34.0 pg   MCHC 30.4 30.0 - 36.0 g/dL   RDW 95.6 (H) 38.7 - 56.4 %   Platelets 386 150 - 400 K/uL   nRBC 0.2 0.0 - 0.2 %   Neutrophils Relative % 81 %   Neutro Abs 9.0 (H) 1.7 - 7.7 K/uL   Lymphocytes Relative 12 %   Lymphs Abs 1.3 0.7 - 4.0 K/uL   Monocytes Relative 5 %   Monocytes Absolute 0.5 0.1 - 1.0 K/uL   Eosinophils Relative 1 %   Eosinophils Absolute 0.1 0.0 - 0.5 K/uL   Basophils Relative 0 %   Basophils Absolute 0.0 0.0 - 0.1 K/uL   Immature Granulocytes 1 %   Abs Immature Granulocytes 0.06 0.00 - 0.07 K/uL    Comment: Performed at [facility], [address], [place], [place] [address]  Lactic acid, plasma     Status: Abnormal   Collection Time: 07/21/20  1:40 PM  Result Value Ref Range   Lactic Acid, Venous 3.0 (HH) 0.5 - 1.9 mmol/L    Comment: CRITICAL RESULT CALLED TO, READ BACK BY AND VERIFIED WITH: Burtis Junes RN 1423 07/21/20 JM Performed at [facility], [address], [place], [place] [address]   Blood culture (routine x 2)     Status: None (Preliminary result)   Collection Time: 07/21/20  1:40 PM   Specimen: BLOOD RIGHT WRIST  Result Value Ref Range   Specimen Description      BLOOD RIGHT WRIST Performed at [facility], [address] [place], [place] [address]    Special Requests      BOTTLES DRAWN AEROBIC AND ANAEROBIC Blood Culture adequate volume Performed at [facility], [address] [place], [place] [address]    Culture      NO GROWTH < 24 HOURS Performed at [facility], [address], [place], [place] [address]    Report Status PENDING   Blood culture (routine x 2)     Status: None (Preliminary result)   Collection Time: 07/21/20  1:52 PM   Specimen: BLOOD  Result Value Ref Range   Specimen Description      BLOOD RIGHT ANTECUBITAL Performed at [facility], [address] [place], [place] [address]    Special Requests       BOTTLES DRAWN AEROBIC AND ANAEROBIC Blood Culture results may not be optimal due to an inadequate volume of blood received in culture bottles Performed at [facility] [place] [facility], [address] [place], [place] [address]    Culture      NO GROWTH < 24 HOURS Performed at [facility], [address] [place], [place] [address]    Report Status PENDING   Resp Panel by RT-PCR (Flu A&B, Covid) Nasopharyngeal Swab  Status: None   Collection Time: 07/21/20  1:57 PM   Specimen: Nasopharyngeal Swab; Nasopharyngeal(NP) swabs in vial transport medium  Result Value Ref Range   SARS Coronavirus 2 by RT PCR NEGATIVE NEGATIVE    Comment: (NOTE) SARS-CoV-2 target nucleic acids are NOT DETECTED.  The SARS-CoV-2 RNA is generally detectable in upper respiratory specimens during the acute phase of infection. The lowest concentration of SARS-CoV-2 viral copies this assay can detect is 138 copies/mL. A negative result does not preclude SARS-Cov-2 infection and should not be used as the sole basis for treatment or other patient management decisions. A negative result may occur with  improper specimen collection/handling, submission of specimen other than nasopharyngeal swab, presence of viral mutation(s) within the areas targeted by this assay, and inadequate number of viral copies(<138 copies/mL). A negative result must be combined with clinical observations, patient history, and epidemiological information. The expected result is Negative.  Fact Sheet for Patients:  BloggerCourse.com  Fact Sheet for Healthcare Providers:  SeriousBroker.it  This test is no t yet approved or cleared by the [place] FDA and  has been authorized for detection and/or diagnosis of SARS-CoV-2 by FDA under an Emergency Use Authorization (EUA). This EUA will remain  in effect (meaning this test can be used) for the duration of the COVID-19 declaration  under Section 564(b)(1) of the Act, 21 U.S.C.section 360bbb-3(b)(1), unless the authorization is terminated  or revoked sooner.       Influenza A by PCR NEGATIVE NEGATIVE   Influenza B by PCR NEGATIVE NEGATIVE    Comment: (NOTE) The Xpert Xpress SARS-CoV-2/FLU/RSV plus assay is intended as an aid in the diagnosis of influenza from Nasopharyngeal swab specimens and should not be used as a sole basis for treatment. Nasal washings and aspirates are unacceptable for Xpert Xpress SARS-CoV-2/FLU/RSV testing.  Fact Sheet for Patients: BloggerCourse.com  Fact Sheet for Healthcare Providers: SeriousBroker.it  This test is not yet approved or cleared by the [place] FDA and has been authorized for detection and/or diagnosis of SARS-CoV-2 by FDA under an Emergency Use Authorization (EUA). This EUA will remain in effect (meaning this test can be used) for the duration of the COVID-19 declaration under Section 564(b)(1) of the Act, 21 U.S.C. section 360bbb-3(b)(1), unless the authorization is terminated or revoked.  Performed at [facility], [address], [place], [place] [address]   Comprehensive metabolic panel     Status: Abnormal   Collection Time: 07/21/20  2:03 PM  Result Value Ref Range   Sodium 143 135 - 145 mmol/L   Potassium 4.4 3.5 - 5.1 mmol/L    Comment: MODERATE HEMOLYSIS   Chloride 119 (H) 98 - 111 mmol/L   CO2 19 (L) 22 - 32 mmol/L   Glucose, Bld 95 70 - 99 mg/dL    Comment: Glucose reference range applies only to samples taken after fasting for at least 8 hours.   BUN 8 8 - 23 mg/dL   Creatinine, Ser 3.29 0.44 - 1.00 mg/dL   Calcium 6.3 (LL) 8.9 - 10.3 mg/dL    Comment: CRITICAL RESULT CALLED TO, READ BACK BY AND VERIFIED WITH: Burtis Junes RN 1449 07/21/20 JM    Total Protein 5.3 (L) 6.5 - 8.1 g/dL   Albumin 1.5 (L) 3.5 - 5.0 g/dL   AST 75 (H) 15 - 41 U/L   ALT 58 (H) 0 - 44 U/L   Alkaline  Phosphatase 46 38 - 126 U/L   Total Bilirubin 2.0 (H) 0.3 -  1.2 mg/dL   GFR, Estimated >62 >56 mL/min    Comment: (NOTE) Calculated using the CKD-EPI Creatinine Equation (2021)    Anion gap 5 5 - 15    Comment: Performed at [facility], [address], [place], [place] [address]  Lactic acid, plasma     Status: Abnormal   Collection Time: 07/21/20  3:29 PM  Result Value Ref Range   Lactic Acid, Venous 2.0 (HH) 0.5 - 1.9 mmol/L    Comment: CRITICAL VALUE NOTED.  VALUE IS CONSISTENT WITH PREVIOUSLY REPORTED AND CALLED VALUE. Performed at [facility], [address] [place], [place] [address]   Lactic acid, plasma     Status: None   Collection Time: 07/21/20  5:33 PM  Result Value Ref Range   Lactic Acid, Venous 1.9 0.5 - 1.9 mmol/L    Comment: REPEATED TO VERIFY Performed at [facility], [address], [place], [place] [address]   Urinalysis, Routine w reflex microscopic Urine, Clean Catch     Status: None   Collection Time: 07/21/20  6:09 PM  Result Value Ref Range   Color, Urine YELLOW YELLOW   APPearance CLEAR CLEAR   Specific Gravity, Urine 1.015 1.005 - 1.030   pH 6.0 5.0 - 8.0   Glucose, UA NEGATIVE NEGATIVE mg/dL   Hgb urine dipstick NEGATIVE NEGATIVE   Bilirubin Urine NEGATIVE NEGATIVE   Ketones, ur NEGATIVE NEGATIVE mg/dL   Protein, ur NEGATIVE NEGATIVE mg/dL   Nitrite NEGATIVE NEGATIVE   Leukocytes,Ua NEGATIVE NEGATIVE    Comment: Performed at [facility], [address], [place], [place] [address]  CBC     Status: Abnormal   Collection Time: 07/22/20  4:35 AM  Result Value Ref Range   WBC 11.6 (H) 4.0 - 10.5 K/uL   RBC 3.37 (L) 3.87 - 5.11 MIL/uL   Hemoglobin 9.6 (L) 12.0 - 15.0 g/dL   HCT 35.5 (L) 97.4 - 16.3 %   MCV 96.1 80.0 - 100.0 fL   MCH 28.5 26.0 - 34.0 pg   MCHC 29.6 (L) 30.0 - 36.0 g/dL   RDW 84.5 (H) 36.4 - 68.0 %   Platelets 357 150 - 400 K/uL   nRBC 0.2 0.0 - 0.2 %     Comment: Performed at [facility], [address], [place], [place] [address]  Comprehensive metabolic panel     Status: Abnormal   Collection Time: 07/22/20  4:35 AM  Result Value Ref Range   Sodium 144 135 - 145 mmol/L   Potassium 3.7 3.5 - 5.1 mmol/L   Chloride 109 98 - 111 mmol/L   CO2 25 22 - 32 mmol/L   Glucose, Bld 93 70 - 99 mg/dL    Comment: Glucose reference range applies only to samples taken after fasting for at least 8 hours.   BUN 11 8 - 23 mg/dL   Creatinine, Ser 4.82 0.44 - 1.00 mg/dL   Calcium 8.9 8.9 - 50.0 mg/dL    Comment: DELTA CHECK NOTED   Total Protein 6.9 6.5 - 8.1 g/dL   Albumin 2.2 (L) 3.5 - 5.0 g/dL   AST 54 (H) 15 - 41 U/L   ALT 67 (H) 0 - 44 U/L   Alkaline Phosphatase 63 38 - 126 U/L   Total Bilirubin 0.5 0.3 - 1.2 mg/dL   GFR, Estimated >37 >04 mL/min    Comment: (NOTE) Calculated using the CKD-EPI Creatinine Equation (2021)    Anion gap  10 5 - 15    Comment: Performed at [facility], [address] [place], [place] [address]  Magnesium     Status: None   Collection Time: 07/22/20  4:35 AM  Result Value Ref Range   Magnesium 2.2 1.7 - 2.4 mg/dL    Comment: Performed at [facility], [address], [place], [place] [address]   CT ABDOMEN PELVIS W CONTRAST  Result Date: 07/21/2020 CLINICAL DATA:  Back pain. Prior lumbar surgery with overlying wound. Abdominal pain. Rule out abscess. EXAM: CT ABDOMEN AND PELVIS WITH CONTRAST TECHNIQUE: Multidetector CT imaging of the abdomen and pelvis was performed using the standard protocol following bolus administration of intravenous contrast. CONTRAST:  OMNIPAQUE IOHEXOL 300 MG/ML  SOLN COMPARISON:  05/09/2020 FINDINGS: Lower chest: Mild atelectasis in the lung bases. No pleural effusion. Coronary atherosclerosis. Hepatobiliary: A few subcentimeter hypodensities in the liver too small to fully characterize. Moderate gallbladder distension is  similar to the prior study as is mild extrahepatic biliary dilatation. Pancreas: Borderline to mild pancreatic ductal dilatation. No peripancreatic inflammation. Spleen: Unremarkable. Adrenals/Urinary Tract: Unremarkable adrenal glands. No evidence of renal mass, calculi, or hydronephrosis. Under distended bladder with appearance of circumferential wall thickening. Stomach/Bowel: The stomach is unremarkable. There is a moderately large amount of stool in the colon and rectum, much greater than on the prior study. There is no evidence of bowel obstruction or inflammation. Vascular/Lymphatic: Abdominal aortic atherosclerosis without aneurysm. New infrarenal IVC filter. Heterogeneously diminished enhancement of the right common femoral vein with question of a central filling defect and partially visualized asymmetric right thigh soft tissue edema. Reproductive: Suspected small calcified uterine fibroid. No adnexal mass. Other: No intraperitoneal free fluid, fluid collection, or pneumoperitoneum. Musculoskeletal: Scoliosis and postoperative changes in the lumbar spine with overlying skin breakdown, evaluated in detail on the separate lumbar spine CT. Old right-sided pubic ramus fractures. IMPRESSION: 1. Possible deep venous thrombosis in the right common femoral vein with asymmetric right thigh soft tissue edema. There was DVT more distally in the right leg on a 05/06/2020 ultrasound. 2. Suspected circumferential bladder wall thickening despite underdistention. Correlate for cystitis. 3. No evidence of intra-abdominal abscess. Lumbar spine reported separately. 4. Moderately large colonic stool burden. No evidence of bowel obstruction. 5. Aortic Atherosclerosis (ICD10-I70.0). Electronically Signed   By: Sebastian Ache M.D.   On: 07/21/2020 17:37   CT L-SPINE NO CHARGE  Result Date: 07/21/2020 CLINICAL DATA:  Previous spinal surgery with poorly healing wound. Metal exposure 2 physical exam. EXAM: CT LUMBAR SPINE  WITHOUT CONTRAST TECHNIQUE: Multidetector CT imaging of the lumbar spine was performed without intravenous contrast administration. Multiplanar CT image reconstructions were also generated. COMPARISON:  CT 05/09/2020 FINDINGS: Segmentation: 5 lumbar type vertebral bodies assumed. Alignment: Fixed scoliotic curvature convex to the left. Kyphotic curvature of the spine at T12-L1. Vertebrae: Previous fusion procedure from L1 to the sacrum with pedicle screws and posterior rods. Pedicle screws on the right at L1, L3, L4, L5 and S1. Pedicle screws on the left at L1, L2, L3, L5 and S1. There appears to be solid union throughout the region. No evidence of hardware fracture or discontinuity. Left-sided screw at L5 enters the disc space, but this does not appear to be associated with any unfavorable finding. Left-sided screw at L2 enters the disc space but this does not appear to be associated with any unfavorable finding. Paraspinal and other soft tissues: Posterior soft tissue skin breakdown posterior to the region of fusion. Based on  the clinical history of hardware being visible, I assume this is related to the right bracket at L1. The left bracket at L1 is just beneath the skin but not apparently associated with an overlying skin defect. No evidence of frank soft tissue abscess. Disc levels: As noted above, there appears to be solid union from L1 to the sacrum. Sufficient patency of the canal and foramina. IMPRESSION: Posterior soft tissue skin breakdown posterior to the region of fusion. Based on the clinical history of hardware being visible, I assume this is related to the right bracket at L1. The left bracket at L1 is just beneath the skin but not apparently associated with an overlying skin defect. No evidence of frank soft tissue abscess. Electronically Signed   By: Paulina Fusi M.D.   On: 07/21/2020 16:52    Pending Labs Unresulted Labs (From admission, onward)          Start     Ordered   07/22/20 1020   Hepatitis panel, acute  Add-on,   AD        07/22/20 1019   07/22/20 0500  CBC  Daily,   R      07/21/20 1733   07/22/20 0500  Comprehensive metabolic panel  Daily,   R      07/21/20 1735          Vitals/Pain Today's Vitals   07/22/20 1130 07/22/20 1145 07/22/20 1200 07/22/20 1215  BP:   (!) 143/72   Pulse: 91 80 87 86  Resp: Temp:      TempSrc:      SpO2: (!) 78% 97% 95% 94%  Weight:      Height:      PainSc:        Isolation Precautions No active isolations  Medications Medications  0.9 %  sodium chloride infusion ( Intravenous Stopped 07/22/20 1123)  apixaban (ELIQUIS) tablet 5 mg (5 mg Oral Given 07/22/20 1122)  busPIRone (BUSPAR) tablet 15 mg (15 mg Oral Given 07/22/20 1122)  cycloSPORINE (RESTASIS) 0.05 % ophthalmic emulsion 1 drop (has no administration in time range)  dronabinol (MARINOL) capsule 2.5 mg (has no administration in time range)  donepezil (ARICEPT) tablet 10 mg (10 mg Oral Given 07/21/20 2221)  feeding supplement (ENSURE ENLIVE / ENSURE PLUS) liquid 237 mL ( Oral Given 07/22/20 1123)  loratadine (CLARITIN) tablet 10 mg (10 mg Oral Given 07/22/20 1123)  linaclotide (LINZESS) capsule 145 mcg (145 mcg Oral Given 07/22/20 1122)  memantine (NAMENDA XR) 24 hr capsule 28 mg (28 mg Oral Given 07/21/20 2215)  oxyCODONE (Oxy IR/ROXICODONE) immediate release tablet 7.5 mg (7.5 mg Oral Given 07/21/20 1822)  acetaminophen (TYLENOL) tablet 650 mg (has no administration in time range)    Or  acetaminophen (TYLENOL) suppository 650 mg (has no administration in time range)  morphine 2 MG/ML injection 1 mg (1 mg Intravenous Given 07/22/20 0404)  ceFEPIme (MAXIPIME) 2 g in sodium chloride 0.9 % 100 mL IVPB (0 g Intravenous Stopped 07/22/20 0337)  vancomycin (VANCOREADY) IVPB 750 mg/150 mL (has no administration in time range)  polyethylene glycol (MIRALAX / GLYCOLAX) packet 17 g (17 g Oral Given 07/22/20 1122)  senna-docusate (Senokot-S) tablet 1 tablet  (1 tablet Oral Given 07/22/20 1121)  sodium chloride 0.9 % bolus 1,000 mL (0 mLs Intravenous Stopped 07/21/20 1520)  HYDROmorphone (DILAUDID) injection 1 mg (1 mg Intravenous Given 07/21/20 1405)  vancomycin (VANCOCIN) IVPB 1000 mg/200 mL premix (0 mg Intravenous Stopped 07/21/20 1520)  ceFEPIme (MAXIPIME) 2 g in sodium chloride 0.9 % 100 mL IVPB (0 g Intravenous Stopped 07/21/20 1415)  calcium gluconate 1 g/ 50 mL sodium chloride IVPB (0 g Intravenous Stopped 07/21/20 1705)  iohexol (OMNIPAQUE) 300 MG/ML solution 100 mL (100 mLs Intravenous Contrast Given 07/21/20 1619)  sodium chloride (PF) 0.9 % injection (  Contrast Given 07/21/20 1620)  vancomycin (VANCOREADY) IVPB 500 mg/100 mL (0 mg Intravenous Stopped 07/21/20 2033)  ketorolac (TORADOL) 30 MG/ML injection 15 mg (15 mg Intravenous Given 07/21/20 2102)    Mobility non-ambulatory Moderate fall risk   Focused Assessments SKIN   R Recommendations: See Admitting Provider Note  Report given to:   Additional Notes:

## 2020-07-22 NOTE — Progress Notes (Addendum)
[facility]  PROGRESS NOTE  [name] ZOX:[id number] DOB: 1940-11-18 DOA: 07/21/2020 PCP: [name], MD Admit date - 07/21/2020   Admitting Physician Laverna Peace, MD  Outpatient Primary MD for the patient is [name], MD  LOS - 1 Brief Narrative  [name] is a 79 y.o. 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  In the ED patient was afebrile, blood pressure range 105/91-1 13/35, tachycardic with heart rate 97-1 04, WBC 11.6, calcium 6.3, CO2 19, lactic acid 3 and CT abdomen showing possible DVT of right common femoral vein as well as bladder wall thickening and large colonic stool burden, CT lumbar spine showing skin breakdown at region of fusion with visible hardware and no evidence of soft tissue abscess. Patient was started on vancomycin and cefepime and blood cultures were obtained prior to antibiotics. [facility] was called for further management  Last hospitalization from 10/13-10/19 for AKI and prior hospitalization for syncope on 9/24 and found to have saddle PE with cor pulmonale treated with TPA complicated by left upper extremity hematoma requiring transfusion, right hip cellulitis treated with a vancomycin and E. coli treated with Keflex with IVC filter placed on 9/29  Subjective  Today feels pain better controlled. Eating breakfast  A & P  Severe Sepsis secondary  Unstageable ulcer of the distal thoracic/early lumbar spine ( present on admission). Presented with leukocytosis, tachycardia, lactic acidosis of 3 with likely source being open unstageable ulcer at lumbar spine with exposed metal, CT lumbar spine shows no evidence of abscess. Has significant amount of pain while laying on back. Status post fluid bolus in ED and initiation of empiric antibiotics. Lactic acidosis has resolved, no new fevers, leukocytosis stable -Pain control IV morphine 1 mg as needed severe pain, continue home oxycodone IR 7.5 mg 4 times daily as needed -Continue empiric vancomycin, cefepime -Monitor blood cultures -Monitor on telemetry --Wound care consulted  Increasing right leg edema with a history of prior DVTs/PE. Patient has IVC filter in has been adherent with Eliquis per family. CT abdomen imaging concerning for worsening edema that may be related to new clot burden with right common femoral vein. -will obtain new venous duplex of lower extremities -Continue home Eliquis  Hypocalcemia, resovled Albumin is very low too so may not be quite as low as it seems. Likely in setting of sepsis. Ca now wnl. Mg wnl   Mild elevation in LFTs. Could be correlated with sepsis physiology pain stable. No abdominal pain currently. -Trend CMP remains, check hepatitis panel -Hold home statin  Incidental finding of bladder wall thickening. UA unremarkable -continue to monitor  Moderately large colonic stool burden without evidence of bowel obstruction is noted on CT abdomen. 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 Status post IVC filter, adherent to Eliquis at home per son. Stable on room air.  No concern  for bleeding currently. CT imaging on admission showed concern for new right-sided DVT of proximal femoral vein -Continue  Eliquis -Obtain venous duplex  Chronic anemia, normocytic, hemoglobin stable at baseline 8-9. No bleeding episodes. -Monitor CBC while on Eliquis  Hypertension. This morning blood pressure is a little bit softer with last SB BP of 105 and diastolic over 91. Given concurrent sepsis physiology will hold off on home blood pressure regimen -Holding home BP meds -Continue to closely monitor  Dementia with behavior disturbances, stable. Alert to self and place and year. -Continue BuSpar, Aricept, Namenda delirium precautions     Family Communication  :  Will call son  Code Status :  Limited Code. Ok with CPR but no intubation in discussion with patient's son on day of admission on the phone   Disposition Plan  :  Patient is from home. Anticipated d/c date: 2 to 3 days. Barriers to d/c or necessity for inpatient status: IV antibiotics for sepsis for infected ulcer, work up for possible new DVT Consults  :  WOund  Procedures  :  Pending venous duplex  DVT Prophylaxis  :  Home eliquis MDM: The below labs and imaging reports were reviewed and summarized above.  Medication management as above.  Lab Results  Component Value Date   PLT 357 07/22/2020    Diet :  Diet Order            Diet Heart Room service appropriate? Yes; Fluid consistency: Thin  Diet effective now                  Inpatient Medications Scheduled Meds: . apixaban  5 mg Oral BID  . busPIRone  15 mg Oral BID  . donepezil  10 mg Oral QHS  . dronabinol  2.5 mg Oral BID AC  . feeding supplement  237 mL Oral TID BM  . linaclotide  145 mcg Oral Daily  . loratadine  10 mg Oral Daily  . memantine  28 mg Oral QHS  . polyethylene glycol  17 g Oral BID  . senna-docusate  1 tablet Oral BID   Continuous Infusions: . ceFEPime (MAXIPIME) IV Stopped (07/22/20 9528)  . vancomycin     PRN Meds:.acetaminophen **OR** acetaminophen, cycloSPORINE, morphine injection, oxyCODONE  Antibiotics  :   Anti-infectives (From  admission, onward)   Start     Dose/Rate Route Frequency Ordered Stop   07/22/20 1830  vancomycin (VANCOREADY) IVPB 750 mg/150 mL        750 mg 150 mL/hr over 60 Minutes Intravenous Every 24 hours 07/21/20 1829     07/22/20 0200  ceFEPIme (MAXIPIME) 2 g in sodium chloride 0.9 % 100 mL IVPB        2 g 200 mL/hr over 30 Minutes Intravenous Every 12 hours 07/21/20 1803     07/21/20 1900  vancomycin (VANCOREADY) IVPB 500 mg/100 mL        500 mg 100 mL/hr over 60 Minutes Intravenous  Once 07/21/20 1803 07/21/20 2033   07/21/20 1345  ceFEPIme (MAXIPIME) 2 g in sodium chloride 0.9 % 100 mL IVPB        2 g 200 mL/hr over 30 Minutes Intravenous  Once 07/21/20 1331 07/21/20 1415   07/21/20 1330  vancomycin (VANCOCIN) IVPB 1000 mg/200 mL premix        1,000 mg 200 mL/hr over 60 Minutes Intravenous  Once 07/21/20 1328 07/21/20 1520   07/21/20 1330  ceFEPIme (MAXIPIME) 1 g in sodium chloride 0.9 %  100 mL IVPB  Status:  Discontinued        1 g 200 mL/hr over 30 Minutes Intravenous  Once 07/21/20 1328 07/21/20 1331       Objective   Vitals:   07/22/20 0600 07/22/20 0631 07/22/20 0645 07/22/20 0900  BP: 133/69  137/71 (!) 105/91  Pulse: 79  75 84  Resp: 13  13 15   Temp:  99.2 F (37.3 C)    TempSrc:  Oral    SpO2: 93%  90% (!) 89%  Weight:      Height:        SpO2: (!) 89 %  Wt Readings from Last 3 Encounters:  07/21/20 83.9 kg  05/25/20 93 kg  05/13/20 93 kg     Intake/Output Summary (Last 24 hours) at 07/22/2020 1013 Last data filed at 07/21/2020 2033 Gross per 24 hour  Intake 1450 ml  Output --  Net 1450 ml    Physical Exam:     Awake Alert, Oriented self, place, comfortable, no distress No new F.N deficits,  [place].AT, Normal respiratory effort on room air, CTAB RRR,No Gallops,Rubs or new Murmurs, 1+ pitting edema of right lower extremity from ankle up to thigh +ve B.Sounds, Abd Soft, No tenderness, No rebound, guarding or rigidity.    I have personally reviewed  the following:   Data Reviewed:  CBC Recent Labs  Lab 07/21/20 1340 07/22/20 0435  WBC 11.1* 11.6*  HGB 12.3 9.6*  HCT 40.5 32.4*  PLT 386 357  MCV 94.4 96.1  MCH 28.7 28.5  MCHC 30.4 29.6*  RDW 16.8* 16.2*  LYMPHSABS 1.3  --   MONOABS 0.5  --   EOSABS 0.1  --   BASOSABS 0.0  --     Chemistries  Recent Labs  Lab 07/21/20 1403 07/22/20 0435  NA 143 144  K 4.4 3.7  CL 119* 109  CO2 19* 25  GLUCOSE 95 93  BUN 8 11  CREATININE 0.67 0.54  CALCIUM 6.3* 8.9  MG  --  2.2  AST 75* 54*  ALT 58* 67*  ALKPHOS 46 63  BILITOT 2.0* 0.5   ------------------------------------------------------------------------------------------------------------------ No results for input(s): CHOL, HDL, LDLCALC, TRIG, CHOLHDL, LDLDIRECT in the last 72 hours.  Lab Results  Component Value Date   HGBA1C 5.2 05/06/2020   ------------------------------------------------------------------------------------------------------------------ No results for input(s): TSH, T4TOTAL, T3FREE, THYROIDAB in the last 72 hours.  Invalid input(s): FREET3 ------------------------------------------------------------------------------------------------------------------ No results for input(s): VITAMINB12, FOLATE, FERRITIN, TIBC, IRON, RETICCTPCT in the last 72 hours.  Coagulation profile No results for input(s): INR, PROTIME in the last 168 hours.  No results for input(s): DDIMER in the last 72 hours.  Cardiac Enzymes No results for input(s): CKMB, TROPONINI, MYOGLOBIN in the last 168 hours.  Invalid input(s): CK ------------------------------------------------------------------------------------------------------------------    Component Value Date/Time   BNP 175.9 (H) 05/06/2020 0123    Micro Results Recent Results (from the past 240 hour(s))  Resp Panel by RT-PCR (Flu A&B, Covid) Nasopharyngeal Swab     Status: None   Collection Time: 07/21/20  1:57 PM   Specimen: Nasopharyngeal Swab;  Nasopharyngeal(NP) swabs in vial transport medium  Result Value Ref Range Status   SARS Coronavirus 2 by RT PCR NEGATIVE NEGATIVE Final    Comment: (NOTE) SARS-CoV-2 target nucleic acids are NOT DETECTED.  The SARS-CoV-2 RNA is generally detectable in upper respiratory specimens during the acute phase of infection. The lowest concentration of SARS-CoV-2 viral copies this assay can detect is 138 copies/mL. A negative result  does not preclude SARS-Cov-2 infection and should not be used as the sole basis for treatment or other patient management decisions. A negative result may occur with  improper specimen collection/handling, submission of specimen other than nasopharyngeal swab, presence of viral mutation(s) within the areas targeted by this assay, and inadequate number of viral copies(<138 copies/mL). A negative result must be combined with clinical observations, patient history, and epidemiological information. The expected result is Negative.  Fact Sheet for Patients:  BloggerCourse.com  Fact Sheet for Healthcare Providers:  SeriousBroker.it  This test is no t yet approved or cleared by the [place] FDA and  has been authorized for detection and/or diagnosis of SARS-CoV-2 by FDA under an Emergency Use Authorization (EUA). This EUA will remain  in effect (meaning this test can be used) for the duration of the COVID-19 declaration under Section 564(b)(1) of the Act, 21 U.S.C.section 360bbb-3(b)(1), unless the authorization is terminated  or revoked sooner.       Influenza A by PCR NEGATIVE NEGATIVE Final   Influenza B by PCR NEGATIVE NEGATIVE Final    Comment: (NOTE) The Xpert Xpress SARS-CoV-2/FLU/RSV plus assay is intended as an aid in the diagnosis of influenza from Nasopharyngeal swab specimens and should not be used as a sole basis for treatment. Nasal washings and aspirates are unacceptable for Xpert Xpress  SARS-CoV-2/FLU/RSV testing.  Fact Sheet for Patients: BloggerCourse.com  Fact Sheet for Healthcare Providers: SeriousBroker.it  This test is not yet approved or cleared by the [place] FDA and has been authorized for detection and/or diagnosis of SARS-CoV-2 by FDA under an Emergency Use Authorization (EUA). This EUA will remain in effect (meaning this test can be used) for the duration of the COVID-19 declaration under Section 564(b)(1) of the Act, 21 U.S.C. section 360bbb-3(b)(1), unless the authorization is terminated or revoked.  Performed at [facility], [address], [place], [place] [address]     Radiology Reports CT ABDOMEN PELVIS W CONTRAST  Result Date: 07/21/2020 CLINICAL DATA:  Back pain. Prior lumbar surgery with overlying wound. Abdominal pain. Rule out abscess. EXAM: CT ABDOMEN AND PELVIS WITH CONTRAST TECHNIQUE: Multidetector CT imaging of the abdomen and pelvis was performed using the standard protocol following bolus administration of intravenous contrast. CONTRAST:  OMNIPAQUE IOHEXOL 300 MG/ML  SOLN COMPARISON:  05/09/2020 FINDINGS: Lower chest: Mild atelectasis in the lung bases. No pleural effusion. Coronary atherosclerosis. Hepatobiliary: A few subcentimeter hypodensities in the liver too small to fully characterize. Moderate gallbladder distension is similar to the prior study as is mild extrahepatic biliary dilatation. Pancreas: Borderline to mild pancreatic ductal dilatation. No peripancreatic inflammation. Spleen: Unremarkable. Adrenals/Urinary Tract: Unremarkable adrenal glands. No evidence of renal mass, calculi, or hydronephrosis. Under distended bladder with appearance of circumferential wall thickening. Stomach/Bowel: The stomach is unremarkable. There is a moderately large amount of stool in the colon and rectum, much greater than on the prior study. There is no evidence of bowel  obstruction or inflammation. Vascular/Lymphatic: Abdominal aortic atherosclerosis without aneurysm. New infrarenal IVC filter. Heterogeneously diminished enhancement of the right common femoral vein with question of a central filling defect and partially visualized asymmetric right thigh soft tissue edema. Reproductive: Suspected small calcified uterine fibroid. No adnexal mass. Other: No intraperitoneal free fluid, fluid collection, or pneumoperitoneum. Musculoskeletal: Scoliosis and postoperative changes in the lumbar spine with overlying skin breakdown, evaluated in detail on the separate lumbar spine CT. Old right-sided pubic ramus fractures. IMPRESSION: 1. Possible deep venous thrombosis in the right common femoral  vein with asymmetric right thigh soft tissue edema. There was DVT more distally in the right leg on a 05/06/2020 ultrasound. 2. Suspected circumferential bladder wall thickening despite underdistention. Correlate for cystitis. 3. No evidence of intra-abdominal abscess. Lumbar spine reported separately. 4. Moderately large colonic stool burden. No evidence of bowel obstruction. 5. Aortic Atherosclerosis (ICD10-I70.0). Electronically Signed   By: Sebastian Ache M.D.   On: 07/21/2020 17:37   CT L-SPINE NO CHARGE  Result Date: 07/21/2020 CLINICAL DATA:  Previous spinal surgery with poorly healing wound. Metal exposure 2 physical exam. EXAM: CT LUMBAR SPINE WITHOUT CONTRAST TECHNIQUE: Multidetector CT imaging of the lumbar spine was performed without intravenous contrast administration. Multiplanar CT image reconstructions were also generated. COMPARISON:  CT 05/09/2020 FINDINGS: Segmentation: 5 lumbar type vertebral bodies assumed. Alignment: Fixed scoliotic curvature convex to the left. Kyphotic curvature of the spine at T12-L1. Vertebrae: Previous fusion procedure from L1 to the sacrum with pedicle screws and posterior rods. Pedicle screws on the right at L1, L3, L4, L5 and S1. Pedicle screws on the  left at L1, L2, L3, L5 and S1. There appears to be solid union throughout the region. No evidence of hardware fracture or discontinuity. Left-sided screw at L5 enters the disc space, but this does not appear to be associated with any unfavorable finding. Left-sided screw at L2 enters the disc space but this does not appear to be associated with any unfavorable finding. Paraspinal and other soft tissues: Posterior soft tissue skin breakdown posterior to the region of fusion. Based on the clinical history of hardware being visible, I assume this is related to the right bracket at L1. The left bracket at L1 is just beneath the skin but not apparently associated with an overlying skin defect. No evidence of frank soft tissue abscess. Disc levels: As noted above, there appears to be solid union from L1 to the sacrum. Sufficient patency of the canal and foramina. IMPRESSION: Posterior soft tissue skin breakdown posterior to the region of fusion. Based on the clinical history of hardware being visible, I assume this is related to the right bracket at L1. The left bracket at L1 is just beneath the skin but not apparently associated with an overlying skin defect. No evidence of frank soft tissue abscess. Electronically Signed   By: Paulina Fusi M.D.   On: 07/21/2020 16:52     Time Spent in minutes  30     Laverna Peace M.D on 07/22/2020 at 10:13 AM  To page go to [email or web address] - password [facility]

## 2020-07-22 NOTE — ED Notes (Signed)
Pt turned and wet-to-dry dressing w/mepalex placed on wound (medial portion R side of back-thoracic). Purulent drainage noted. Perineal care performed and brief changed. No needs or concerns identified at this time by this nurse.

## 2020-07-22 NOTE — Plan of Care (Signed)

## 2020-07-23 DIAGNOSIS — A419 Sepsis, unspecified organism: Principal | ICD-10-CM

## 2020-07-23 DIAGNOSIS — L89109 Pressure ulcer of unspecified part of back, unspecified stage: Secondary | ICD-10-CM

## 2020-07-23 DIAGNOSIS — F0150 Vascular dementia without behavioral disturbance: Secondary | ICD-10-CM

## 2020-07-23 DIAGNOSIS — F3289 Other specified depressive episodes: Secondary | ICD-10-CM

## 2020-07-23 DIAGNOSIS — M869 Osteomyelitis, unspecified: Secondary | ICD-10-CM | POA: Diagnosis present

## 2020-07-23 DIAGNOSIS — T847XXA Infection and inflammatory reaction due to other internal orthopedic prosthetic devices, implants and grafts, initial encounter: Secondary | ICD-10-CM | POA: Insufficient documentation

## 2020-07-23 DIAGNOSIS — I82502 Chronic embolism and thrombosis of unspecified deep veins of left lower extremity: Secondary | ICD-10-CM

## 2020-07-23 DIAGNOSIS — M545 Low back pain, unspecified: Secondary | ICD-10-CM

## 2020-07-23 DIAGNOSIS — G8929 Other chronic pain: Secondary | ICD-10-CM

## 2020-07-23 DIAGNOSIS — R600 Localized edema: Secondary | ICD-10-CM

## 2020-07-23 DIAGNOSIS — M549 Dorsalgia, unspecified: Secondary | ICD-10-CM | POA: Insufficient documentation

## 2020-07-23 DIAGNOSIS — T8463XA Infection and inflammatory reaction due to internal fixation device of spine, initial encounter: Secondary | ICD-10-CM

## 2020-07-23 DIAGNOSIS — Z7901 Long term (current) use of anticoagulants: Secondary | ICD-10-CM

## 2020-07-23 DIAGNOSIS — L8990 Pressure ulcer of unspecified site, unspecified stage: Secondary | ICD-10-CM

## 2020-07-23 LAB — BASIC METABOLIC PANEL
Anion gap: 8 (ref 5–15)
BUN: 9 mg/dL (ref 8–23)
CO2: 23 mmol/L (ref 22–32)
Calcium: 8.4 mg/dL — ABNORMAL LOW (ref 8.9–10.3)
Chloride: 110 mmol/L (ref 98–111)
Creatinine, Ser: 0.43 mg/dL — ABNORMAL LOW (ref 0.44–1.00)
GFR, Estimated: >60 mL/min (ref >60)
Glucose, Bld: 120 mg/dL — ABNORMAL HIGH (ref 70–99)
Potassium: 3.4 mmol/L — ABNORMAL LOW (ref 3.5–5.1)
Sodium: 141 mmol/L (ref 135–145)

## 2020-07-23 LAB — COMPREHENSIVE METABOLIC PANEL
ALT: 55 U/L — ABNORMAL HIGH (ref 0–44)
AST: 38 U/L (ref 15–41)
Albumin: 2 g/dL — ABNORMAL LOW (ref 3.5–5.0)
Alkaline Phosphatase: 56 U/L (ref 38–126)
Anion gap: 11 (ref 5–15)
BUN: 8 mg/dL (ref 8–23)
CO2: 23 mmol/L (ref 22–32)
Calcium: 8.6 mg/dL — ABNORMAL LOW (ref 8.9–10.3)
Chloride: 109 mmol/L (ref 98–111)
Creatinine, Ser: 0.4 mg/dL — ABNORMAL LOW (ref 0.44–1.00)
GFR, Estimated: >60 mL/min (ref >60)
Glucose, Bld: 86 mg/dL (ref 70–99)
Potassium: 3.1 mmol/L — ABNORMAL LOW (ref 3.5–5.1)
Sodium: 143 mmol/L (ref 135–145)
Total Bilirubin: 0.8 mg/dL (ref 0.3–1.2)
Total Protein: 6.3 g/dL — ABNORMAL LOW (ref 6.5–8.1)

## 2020-07-23 LAB — MAGNESIUM: Magnesium: 2 mg/dL (ref 1.7–2.4)

## 2020-07-23 LAB — HEPATITIS PANEL, ACUTE
HCV Ab: NONREACTIVE
Hep A IgM: NONREACTIVE
Hep B C IgM: NONREACTIVE
Hepatitis B Surface Ag: NONREACTIVE

## 2020-07-23 LAB — CBC
HCT: 31.8 % — ABNORMAL LOW (ref 36.0–46.0)
Hemoglobin: 9.3 g/dL — ABNORMAL LOW (ref 12.0–15.0)
MCH: 28.2 pg (ref 26.0–34.0)
MCHC: 29.2 g/dL — ABNORMAL LOW (ref 30.0–36.0)
MCV: 96.4 fL (ref 80.0–100.0)
Platelets: 336 10*3/uL (ref 150–400)
RBC: 3.3 MIL/uL — ABNORMAL LOW (ref 3.87–5.11)
RDW: 16.2 % — ABNORMAL HIGH (ref 11.5–15.5)
WBC: 11.9 10*3/uL — ABNORMAL HIGH (ref 4.0–10.5)
nRBC: 0.2 % (ref 0.0–0.2)

## 2020-07-23 MED ORDER — POTASSIUM CHLORIDE 20 MEQ PO PACK
40.0000 meq | PACK | Freq: Once | ORAL | Status: AC
  Administered 2020-07-23: 10:00:00 40 meq via ORAL
  Filled 2020-07-23: qty 2

## 2020-07-23 MED ORDER — POTASSIUM CHLORIDE 20 MEQ PO PACK
40.0000 meq | PACK | Freq: Once | ORAL | Status: AC
  Administered 2020-07-23: 13:00:00 40 meq via ORAL
  Filled 2020-07-23: qty 2

## 2020-07-23 MED ORDER — METRONIDAZOLE 500 MG PO TABS: 500.0000 mg | ORAL_TABLET | Freq: Three times a day (TID) | ORAL | Status: DC

## 2020-07-23 NOTE — Progress Notes (Signed)
Patients left upper extremity is edematous. Ring on and unable to remove. Provider at bedside, assessment complete. V.O received to remove with ring cutter. Son at bedside and ok with removal.

## 2020-07-23 NOTE — Consult Note (Signed)
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 Also noted to have likely chronic right LE dvt   12/10 decub ulcer wound swab cx in ed few ecoli, abundant strept anginosus. ecoli non-esbl 12/10 bcx ngtd  Reviewed neurosurgery note. Potential hardware removal with plastic surgery for closure of large open wound  I reviewed the pictures of the wound doesn't appear grossly infected; clean based and evidence of granulation  Plan: 1. Given relatively stable hemodynamics and no frank sepsis could hold abx and await tissue culture for more directed abx treatment 2. If off abx patient developes fever along with hemodyanmic instability could add back current empiric abx regimen 3.   Otherwise please reengage ID after NSG/Plastic surgery complete surgical management   Active Problems:   Right leg swelling   Depression   History of DVT (deep vein thrombosis)   Dementia (HCC)   Benign essential HTN   Pulmonary embolism (HCC)   Transaminitis   Severe sepsis (HCC)   Hypocalcemia   Osteomyelitis (HCC)   Hardware complicating wound infection (HCC)   Scheduled Meds: . apixaban  5 mg Oral BID  . busPIRone  15 mg Oral BID  . donepezil  10 mg Oral QHS  . dronabinol  2.5 mg Oral BID AC  . feeding supplement  237 mL Oral TID BM  . linaclotide  145 mcg Oral Daily  . loratadine  10 mg Oral Daily  . memantine  28 mg Oral QHS  . metroNIDAZOLE   500 mg Oral Q8H  . polyethylene glycol  17 g Oral BID  . senna-docusate  1 tablet Oral BID   Continuous Infusions: . ceFEPime (MAXIPIME) IV 2 g (07/23/20 0257)  . vancomycin 750 mg (07/22/20 1955)   PRN Meds:.acetaminophen **OR** acetaminophen, cycloSPORINE, morphine injection, oxyCODONE  HPI: ARLANDA BONSALL is a 79 y.o. 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 Treated with TPA, complicated by left upper extremity hematoma requiring transfusion. Received ivc filter. Subsequently placed on eliquis  Poor functional status/mostly bedbound. Oriented to self/family members. Weak left side. Patient told me she is ambulatory but had very difficult time just turning in bed on exam  Patient had had chronic thoracic decub and recently ?lumbar decub ulcer developed. Has been receiving hh with antibiotics that is given 4 times a day, but can't remember the name  Patient on 2 weeks oral abx prior to admission. No subjective fever/chill at home. A few days ago spouse noted the metal beign exposed. Ultimately seen wound care center 12/10 and referred for admission directly   Hospital course:  Afebrile, hds although relative tachycardia Wbc 11.6, lactate 3 Ct abd possible dvt right  common femoral vein, bladder wall thickening, large colonic stool burden Ct L-spine no soft tissue abscess; breakdown at region of hardware  Empiric vanc/cefepime for sepsis  She complains mainly of lower back pain but denies headache, cough, chest pain, sob, rash, n/v/dysuria, flank pain   Review of Systems: ROS Negative 11 point ros unless mentioned above  Past Medical History:  Diagnosis Date  . Arthritis   . Asthma   . Chronic  back pain   . Depression   . History of pulmonary embolus (PE)   . Hx-TIA (transient ischemic attack)   . Hyperlipemia   . Hypertension   . Personal history of DVT (deep vein thrombosis)   . Scoliosis   . Sleep apnea    cpap - settingsi at 2   . Stroke (HCC)   . 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 Smoking status: Former Smoker    Packs/day: 1.00    Years: 10.00    Pack years: 10.00    Types: Cigarettes    Quit date: 08/12/1973    Years since quitting: 46.9  . Smokeless tobacco: Never Used  Vaping Use  . Vaping Use: Never used  Substance Use Topics  . Alcohol use: No  . Drug use: No    Family History  Problem Relation Age of Onset  . Other Father        kidney problems   Allergies  Allergen Reactions  . T-Pa [Alteplase] Swelling    Does okay with it if premedicated with benadryl.  . Codeine Itching    OBJECTIVE: Blood pressure (!) 155/79, pulse 87, temperature 98.7 F (37.1 C), temperature source Oral, resp. rate 18, height 5\' 2"  (1.575 m), weight 83.9 kg, SpO2 99 %.  Physical Exam Constitutional:      General: She is not in acute distress. HENT:     Head: Atraumatic.     Mouth/Throat:     Pharynx: Oropharynx is clear.  Eyes:     Extraocular Movements: Extraocular movements intact.     Pupils: Pupils are equal, round, and reactive to light.  Cardiovascular:     Rate and Rhythm: Normal rate and regular rhythm.     Heart sounds: Normal heart sounds.  Pulmonary:     Effort: Pulmonary effort is normal.     Breath sounds: Normal breath sounds.  Abdominal:     Palpations: Abdomen is soft.  Musculoskeletal:     Cervical back: Normal range of motion.     Comments: Reviewed picture of wound from H&P nontender thoracic/cervical spine  Edema LUE and RLE  Skin:    General: Skin is warm.     Findings: No rash.  Neurological:     Mental Status: She is alert and oriented to person, place, and time.     Comments: Generalized  weakness with left sided paresis  Psychiatric:        Mood and Affect: Mood normal.     Lab Results Lab Results  Component Value Date   WBC 11.9 (H) 07/23/2020   HGB 9.3 (L) 07/23/2020   HCT 31.8 (L) 07/23/2020   MCV 96.4 07/23/2020   PLT 336 07/23/2020    Lab Results  Component Value Date   CREATININE 0.40 (L) 07/23/2020   BUN 8 07/23/2020   NA 143 07/23/2020   K 3.1 (L) 07/23/2020   CL 109 07/23/2020   CO2 23 07/23/2020    Lab Results  Component Value Date  ALT 55 (H) 07/23/2020   AST 38 07/23/2020   ALKPHOS 56 07/23/2020   BILITOT 0.8 07/23/2020     Microbiology: Recent Results (from the past 240 hour(s))  Aerobic Culture (superficial specimen)     Status: None (Preliminary result)   Collection Time: 07/21/20 12:00 PM   Specimen: Wound  Result Value Ref Range Status   Specimen Description   Final    WOUND BACK Performed at [facility], [address], [place], [place] [address]    Special Requests   Final    NONE Performed at [facility], [address] [place], [place] [address]    Gram Stain   Final    ABUNDANT WBC PRESENT,BOTH PMN AND MONONUCLEAR ABUNDANT GRAM NEGATIVE RODS MODERATE GRAM POSITIVE COCCI    Culture   Final    FEW ESCHERICHIA COLI ABUNDANT STREPTOCOCCUS ANGINOSIS CULTURE REINCUBATED FOR BETTER GROWTH Performed at [facility], [address] [place], [place] [address]    Report Status PENDING  Incomplete   Organism ID, Bacteria ESCHERICHIA COLI  Final      Susceptibility   Escherichia coli - MIC*    AMPICILLIN <=2 SENSITIVE Sensitive     CEFAZOLIN <=4 SENSITIVE Sensitive     CEFEPIME <=0.12 SENSITIVE Sensitive     CEFTAZIDIME <=1 SENSITIVE Sensitive     CEFTRIAXONE <=0.25 SENSITIVE Sensitive     CIPROFLOXACIN <=0.25 SENSITIVE Sensitive     GENTAMICIN <=1 SENSITIVE Sensitive     IMIPENEM <=0.25 SENSITIVE Sensitive     TRIMETH/SULFA <=20 SENSITIVE Sensitive      AMPICILLIN/SULBACTAM <=2 SENSITIVE Sensitive     PIP/TAZO <=4 SENSITIVE Sensitive     * FEW ESCHERICHIA COLI  Blood culture (routine x 2)     Status: None (Preliminary result)   Collection Time: 07/21/20  1:40 PM   Specimen: BLOOD RIGHT WRIST  Result Value Ref Range Status   Specimen Description   Final    BLOOD RIGHT WRIST Performed at [facility], [address], [place], [place] [address]    Special Requests   Final    BOTTLES DRAWN AEROBIC AND ANAEROBIC Blood Culture adequate volume Performed at [facility], [address] [place], [place] [address]    Culture   Final    NO GROWTH 2 DAYS Performed at [facility], [address] [place], [place] [address]    Report Status PENDING  Incomplete  Blood culture (routine x 2)     Status: None (Preliminary result)   Collection Time: 07/21/20  1:52 PM   Specimen: BLOOD  Result Value Ref Range Status   Specimen Description   Final    BLOOD RIGHT ANTECUBITAL Performed at [facility], [address], [place], [place] [address]    Special Requests   Final    BOTTLES DRAWN AEROBIC AND ANAEROBIC Blood Culture results may not be optimal due to an inadequate volume of blood received in culture bottles Performed at [facility], [address] [place], [place] [address]    Culture   Final    NO GROWTH 2 DAYS Performed at [facility], [address], [place], [place] [address]    Report Status PENDING  Incomplete  Resp Panel by RT-PCR (Flu A&B, Covid) Nasopharyngeal Swab     Status: None   Collection Time: 07/21/20  1:57 PM   Specimen: Nasopharyngeal Swab; Nasopharyngeal(NP) swabs in vial transport medium  Result  Value Ref Range Status   SARS Coronavirus 2 by RT PCR NEGATIVE NEGATIVE Final    Comment: (NOTE) SARS-CoV-2 target nucleic acids are NOT DETECTED.  The SARS-CoV-2 RNA is generally detectable in upper respiratory specimens during the  acute phase of infection. The lowest concentration of SARS-CoV-2 viral copies this assay can detect is 138 copies/mL. A negative result does not preclude SARS-Cov-2 infection and should not be used as the sole basis for treatment or other patient management decisions. A negative result may occur with  improper specimen collection/handling, submission of specimen other than nasopharyngeal swab, presence of viral mutation(s) within the areas targeted by this assay, and inadequate number of viral copies(<138 copies/mL). A negative result must be combined with clinical observations, patient history, and epidemiological information. The expected result is Negative.  Fact Sheet for Patients:  BloggerCourse.com  Fact Sheet for Healthcare Providers:  SeriousBroker.it  This test is no t yet approved or cleared by the [place] FDA and  has been authorized for detection and/or diagnosis of SARS-CoV-2 by FDA under an Emergency Use Authorization (EUA). This EUA will remain  in effect (meaning this test can be used) for the duration of the COVID-19 declaration under Section 564(b)(1) of the Act, 21 U.S.C.section 360bbb-3(b)(1), unless the authorization is terminated  or revoked sooner.       Influenza A by PCR NEGATIVE NEGATIVE Final   Influenza B by PCR NEGATIVE NEGATIVE Final    Comment: (NOTE) The Xpert Xpress SARS-CoV-2/FLU/RSV plus assay is intended as an aid in the diagnosis of influenza from Nasopharyngeal swab specimens and should not be used as a sole basis for treatment. Nasal washings and aspirates are unacceptable for Xpert Xpress SARS-CoV-2/FLU/RSV testing.  Fact Sheet for Patients: BloggerCourse.com  Fact Sheet for Healthcare Providers: SeriousBroker.it  This test is not yet approved or cleared by the [place] FDA and has been authorized for detection and/or  diagnosis of SARS-CoV-2 by FDA under an Emergency Use Authorization (EUA). This EUA will remain in effect (meaning this test can be used) for the duration of the COVID-19 declaration under Section 564(b)(1) of the Act, 21 U.S.C. section 360bbb-3(b)(1), unless the authorization is terminated or revoked.  Performed at [facility], [address], [place], [place] [address]    Imaging: 12/11 duplex LE [place] RIGHT:  - Findings consistent with age indeterminate deep vein thrombosis  involving the right common femoral vein below the level of the  saphenofemoral junction, proximal right femoral vein, and right proximal  profunda vein.  - A cystic structure is found in the popliteal fossa.    LEFT:  - There is no evidence of deep vein thrombosis in the lower extremity.  - No cystic structure found in the popliteal fossa.   12/10 abd/pelv ct 1. Possible deep venous thrombosis in the right common femoral vein with asymmetric right thigh soft tissue edema. There was DVT more distally in the right leg on a 05/06/2020 ultrasound.  2. Suspected circumferential bladder wall thickening despite underdistention. Correlate for cystitis. 3. No evidence of intra-abdominal abscess. Lumbar spine reported separately. 4. Moderately large colonic stool burden. No evidence of bowel obstruction. 5. Aortic Atherosclerosis (ICD10-I70.0).  Raymondo Band, MD [facility] [phone] pager    07/23/2020, 2:50 PM

## 2020-07-23 NOTE — Progress Notes (Addendum)
[facility]  PROGRESS NOTE  [name] TRR:[id number] DOB: 1941/08/05 DOA: 07/21/2020 PCP: [name], MD Admit date - 07/21/2020   Admitting Physician Laverna Peace, MD  Outpatient Primary MD for the patient is [name], MD  LOS - 2 Brief Narrative  [name] is a 79 y.o. 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  In the ED patient was afebrile, blood pressure range 105/91-1 13/35, tachycardic with heart rate 97-1 04, WBC 11.6, calcium 6.3, CO2 19, lactic acid 3 and CT abdomen showing possible DVT of right common femoral vein as well as bladder wall thickening and large colonic stool burden, CT lumbar spine showing skin breakdown at region of fusion with visible hardware and no evidence of soft tissue abscess. Patient was started on vancomycin and cefepime and blood cultures were obtained prior to antibiotics. [facility] was called for further management  Last hospitalization from 10/13-10/19 for AKI while at inpatient rehab and prior hospitalization for syncope on 9/24 and found to have saddle PE with cor pulmonale treated with TPA complicated by left upper  extremity hematoma requiring transfusion, right hip cellulitis treated with a vancomycin and E. coli treated with Keflex with IVC filter placed on 9/29. Per chart review after last hospitalization patient was discharged home with hospice.  Per chart review (06/19/2020) has [facility] outpatient palliative services. She is not under hospice care, but per her MOST form is agreeable to IVF and IV antibiotics to treat what is treatable. Also states that patient has had infected thoracic spine decubitus ulcer with exposed hardware that was being treated with Santyl and wet to dry dressings  Subjective  In more pain this afternoon. Son at bedside. Last pain medication a few hours ago. 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 Presented with leukocytosis, tachycardia, lactic acidosis of 3 with likely source being open unstageable ulcer at lumbar spine with exposed metal, CT lumbar spine shows no evidence of abscess. Has significant amount of pain while laying on back. Status post fluid bolus in ED and initiation of empiric antibiotics. Lactic acidosis has resolved, no new fevers, leukocytosis stable -Pain control IV morphine 1 mg as needed severe pain, continue home oxycodone IR 7.5 mg 4 times daily as needed -Continue empiric vancomycin, cefepime -Monitor blood cultures -Monitor on telemetry --Wound care consulted -ID consulted, adding Flagyl (EKG, check QTC) empiric antibiotics, will consult neurosurgery as well about potential to remove hardware  Exposed hardware from surgical decompression and arthrodesis from L1-S1 on 03/31/2006 secondary to symptomatic spondylitic stenosis with idiopathic scoliosis.  Per note on 06/19/20, patient has had decubitus ulcer with exposed hardware being treated at home with wet to  dry dressings and santyl. In speaking  with family they noted increase odorous drainage which prompted evaluation at wound care center who recommended she be evaluated in ED which led to this admission -Consulted neurosurgery given concern for infection and exposed hardware  Increasing right leg edema with a history of prior DVTs/PE. Patient has IVC filter in has been adherent with Eliquis per family. CT abdomen imaging concerning for worsening edema that may be related to new clot burden with right common femoral vein.  Venous duplex also shows proximal right common femoral vein DVT of indeterminate age -Status post IVC filter last hospitalization in September of this year -Continue home Eliquis  Hypocalcemia, resovled Albumin is very low too so may not be quite as low as it seems. Likely in setting of sepsis. Ca now wnl. Mg wnl  Mild elevation in LFTs. Could be correlated with sepsis physiology pain stable. No abdominal pain currently. -Trend CMP remains, check hepatitis panel -Hold home statin  Incidental finding of bladder wall thickening. UA unremarkable -continue to monitor  Moderately large colonic stool burden without evidence of bowel obstruction is noted on CT abdomen. 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 Status post IVC filter, adherent to Eliquis at home per son. Stable on room air.  No concern for bleeding currently. CT imaging on admission showed concern for new right-sided DVT of proximal femoral vein -Continue Eliquis -Has IVC filter in place  Left upper extremity swelling in setting of hematoma from prior hospitalization secondary to TPA. -Elevate extremity, swelling does not improve may warrant venous duplex of upper extremity as well  Chronic anemia, normocytic, hemoglobin stable at baseline 8-9. No bleeding episodes. -Monitor CBC while on  Eliquis  Hypertension. This morning blood pressure is a little bit softer with last SB BP of 105 and diastolic over 91. Given concurrent sepsis physiology will hold off on home blood pressure regimen -Holding home BP meds -Continue to closely monitor  Dementia with behavior disturbances, stable. Alert to self and place and year. -Continue BuSpar, Aricept, Namenda delirium precautions     Family Communication  : Updated son at bedside on 12/12  Code Status :  Limited Code. Ok with CPR but no intubation in discussion with patient's son on day of admission on the phone   Disposition Plan  :  Patient is from home. Anticipated d/c date:  In 3 days. Barriers to d/c or necessity for inpatient status: IV antibiotics for sepsis for infected ulcer complicated by exposed hardware Consults  :  WOund, ID, neurosurgery  Procedures  : Venous duplex  DVT Prophylaxis  :  Home eliquis MDM: The below labs and imaging reports were reviewed and summarized above.  Medication management as above.  Lab Results  Component Value Date   PLT 336 07/23/2020    Diet :  Diet Order            DIET - DYS 1 Room service appropriate? Yes; Fluid consistency: Thin  Diet effective now                  Inpatient Medications Scheduled Meds: . apixaban  5 mg Oral BID  . busPIRone  15 mg Oral BID  . donepezil  10 mg Oral QHS  . dronabinol  2.5 mg Oral BID AC  . feeding supplement  237 mL Oral TID BM  .  linaclotide  145 mcg Oral Daily  . loratadine  10 mg Oral Daily  . memantine  28 mg Oral QHS  . metroNIDAZOLE  500 mg Oral Q8H  . polyethylene glycol  17 g Oral BID  . senna-docusate  1 tablet Oral BID   Continuous Infusions: . ceFEPime (MAXIPIME) IV 2 g (07/23/20 0257)  . vancomycin 750 mg (07/22/20 1955)   PRN Meds:.acetaminophen **OR** acetaminophen, cycloSPORINE, morphine injection, oxyCODONE  Antibiotics  :   Anti-infectives (From admission, onward)   Start     Dose/Rate Route Frequency  Ordered Stop   07/23/20 1530  metroNIDAZOLE (FLAGYL) tablet 500 mg        500 mg Oral Every 8 hours 07/23/20 1433     07/22/20 1830  vancomycin (VANCOREADY) IVPB 750 mg/150 mL        750 mg 150 mL/hr over 60 Minutes Intravenous Every 24 hours 07/21/20 1829     07/22/20 0200  ceFEPIme (MAXIPIME) 2 g in sodium chloride 0.9 % 100 mL IVPB        2 g 200 mL/hr over 30 Minutes Intravenous Every 12 hours 07/21/20 1803     07/21/20 1900  vancomycin (VANCOREADY) IVPB 500 mg/100 mL        500 mg 100 mL/hr over 60 Minutes Intravenous  Once 07/21/20 1803 07/21/20 2033   07/21/20 1345  ceFEPIme (MAXIPIME) 2 g in sodium chloride 0.9 % 100 mL IVPB        2 g 200 mL/hr over 30 Minutes Intravenous  Once 07/21/20 1331 07/21/20 1415   07/21/20 1330  vancomycin (VANCOCIN) IVPB 1000 mg/200 mL premix        1,000 mg 200 mL/hr over 60 Minutes Intravenous  Once 07/21/20 1328 07/21/20 1520   07/21/20 1330  ceFEPIme (MAXIPIME) 1 g in sodium chloride 0.9 % 100 mL IVPB  Status:  Discontinued        1 g 200 mL/hr over 30 Minutes Intravenous  Once 07/21/20 1328 07/21/20 1331       Objective   Vitals:   07/22/20 2356 07/23/20 0405 07/23/20 0944 07/23/20 1228  BP: 131/85 (!) 146/64 (!) 165/59 (!) 155/79  Pulse: 84  81 87  Resp:   20 18  Temp: 98.3 F (36.8 C) 98.4 F (36.9 C) 97.9 F (36.6 C) 98.7 F (37.1 C)  TempSrc: Oral Oral Oral Oral  SpO2: 98%  98% 99%  Weight:      Height:        SpO2: 99 %  Wt Readings from Last 3 Encounters:  07/21/20 83.9 kg  05/25/20 93 kg  05/13/20 93 kg     Intake/Output Summary (Last 24 hours) at 07/23/2020 1436 Last data filed at 07/23/2020 1302 Gross per 24 hour  Intake 1775 ml  Output 1201 ml  Net 574 ml    Physical Exam:     Awake Alert, Oriented self, place,  no distress No new F.N deficits,  [place].AT, Normal respiratory effort on room air, CTAB RRR,No Gallops,Rubs or new Murmurs, 1+ pitting edema of right lower extremity from ankle up to  thigh Lue edema ( from hand to elbow), chronic per son ( though slightly worse from pictures he shows me) +ve B.Sounds, Abd Soft, No tenderness, No rebound, guarding or rigidity. From pics on admission on 12/10         I have personally reviewed the following:   Data Reviewed:  CBC Recent Labs  Lab 07/21/20 1340 07/22/20 0435 07/23/20 0458  WBC 11.1* 11.6* 11.9*  HGB 12.3 9.6* 9.3*  HCT 40.5 32.4* 31.8*  PLT 386 357 336  MCV 94.4 96.1 96.4  MCH 28.7 28.5 28.2  MCHC 30.4 29.6* 29.2*  RDW 16.8* 16.2* 16.2*  LYMPHSABS 1.3  --   --   MONOABS 0.5  --   --   EOSABS 0.1  --   --   BASOSABS 0.0  --   --     Chemistries  Recent Labs  Lab 07/21/20 1403 07/22/20 0435 07/23/20 0458  NA 143 144 143  K 4.4 3.7 3.1*  CL 119* 109 109  CO2 19* 25 23  GLUCOSE 95 93 86  BUN 8 11 8   CREATININE 0.67 0.54 0.40*  CALCIUM 6.3* 8.9 8.6*  MG  --  2.2 2.0  AST 75* 54* 38  ALT 58* 67* 55*  ALKPHOS 46 63 56  BILITOT 2.0* 0.5 0.8   ------------------------------------------------------------------------------------------------------------------ No results for input(s): CHOL, HDL, LDLCALC, TRIG, CHOLHDL, LDLDIRECT in the last 72 hours.  Lab Results  Component Value Date   HGBA1C 5.2 05/06/2020   ------------------------------------------------------------------------------------------------------------------ No results for input(s): TSH, T4TOTAL, T3FREE, THYROIDAB in the last 72 hours.  Invalid input(s): FREET3 ------------------------------------------------------------------------------------------------------------------ No results for input(s): VITAMINB12, FOLATE, FERRITIN, TIBC, IRON, RETICCTPCT in the last 72 hours.  Coagulation profile No results for input(s): INR, PROTIME in the last 168 hours.  No results for input(s): DDIMER in the last 72 hours.  Cardiac Enzymes No results for input(s): CKMB, TROPONINI, MYOGLOBIN in the last 168 hours.  Invalid input(s):  CK ------------------------------------------------------------------------------------------------------------------    Component Value Date/Time   BNP 175.9 (H) 05/06/2020 0123    Micro Results Recent Results (from the past 240 hour(s))  Aerobic Culture (superficial specimen)     Status: None (Preliminary result)   Collection Time: 07/21/20 12:00 PM   Specimen: Wound  Result Value Ref Range Status   Specimen Description   Final    WOUND BACK Performed at [facility], [address], [place], [place] [address]    Special Requests   Final    NONE Performed at [facility], [address], [place], [place] [address]    Gram Stain   Final    ABUNDANT WBC PRESENT,BOTH PMN AND MONONUCLEAR ABUNDANT GRAM NEGATIVE RODS MODERATE GRAM POSITIVE COCCI    Culture   Final    FEW ESCHERICHIA COLI ABUNDANT STREPTOCOCCUS ANGINOSIS CULTURE REINCUBATED FOR BETTER GROWTH Performed at [facility], [address] [place], [place] [address]    Report Status PENDING  Incomplete   Organism ID, Bacteria ESCHERICHIA COLI  Final      Susceptibility   Escherichia coli - MIC*    AMPICILLIN <=2 SENSITIVE Sensitive     CEFAZOLIN <=4 SENSITIVE Sensitive     CEFEPIME <=0.12 SENSITIVE Sensitive     CEFTAZIDIME <=1 SENSITIVE Sensitive     CEFTRIAXONE <=0.25 SENSITIVE Sensitive     CIPROFLOXACIN <=0.25 SENSITIVE Sensitive     GENTAMICIN <=1 SENSITIVE Sensitive     IMIPENEM <=0.25 SENSITIVE Sensitive     TRIMETH/SULFA <=20 SENSITIVE Sensitive     AMPICILLIN/SULBACTAM <=2 SENSITIVE Sensitive     PIP/TAZO <=4 SENSITIVE Sensitive     * FEW ESCHERICHIA COLI  Blood culture (routine x 2)     Status: None (Preliminary result)   Collection Time: 07/21/20  1:40 PM   Specimen: BLOOD RIGHT WRIST  Result Value Ref Range Status   Specimen Description   Final  BLOOD RIGHT WRIST Performed at [facility], [address] [place], [place]  [address]    Special Requests   Final    BOTTLES DRAWN AEROBIC AND ANAEROBIC Blood Culture adequate volume Performed at [facility], [address], [place], [place] [address]    Culture   Final    NO GROWTH 2 DAYS Performed at [facility], [address] [place], [place] [address]    Report Status PENDING  Incomplete  Blood culture (routine x 2)     Status: None (Preliminary result)   Collection Time: 07/21/20  1:52 PM   Specimen: BLOOD  Result Value Ref Range Status   Specimen Description   Final    BLOOD RIGHT ANTECUBITAL Performed at [facility], [address] [place], [place] [address]    Special Requests   Final    BOTTLES DRAWN AEROBIC AND ANAEROBIC Blood Culture results may not be optimal due to an inadequate volume of blood received in culture bottles Performed at [facility], [address], [place], [place] [address]    Culture   Final    NO GROWTH 2 DAYS Performed at [facility], [address], [place], [place] [address]    Report Status PENDING  Incomplete  Resp Panel by RT-PCR (Flu A&B, Covid) Nasopharyngeal Swab     Status: None   Collection Time: 07/21/20  1:57 PM   Specimen: Nasopharyngeal Swab; Nasopharyngeal(NP) swabs in vial transport medium  Result Value Ref Range Status   SARS Coronavirus 2 by RT PCR NEGATIVE NEGATIVE Final    Comment: (NOTE) SARS-CoV-2 target nucleic acids are NOT DETECTED.  The SARS-CoV-2 RNA is generally detectable in upper respiratory specimens during the acute phase of infection. The lowest concentration of SARS-CoV-2 viral copies this assay can detect is 138 copies/mL. A negative result does not preclude SARS-Cov-2 infection and should not be used as the sole basis for treatment or other patient management decisions. A negative result may occur with  improper specimen collection/handling, submission of specimen other than nasopharyngeal swab, presence of  viral mutation(s) within the areas targeted by this assay, and inadequate number of viral copies(<138 copies/mL). A negative result must be combined with clinical observations, patient history, and epidemiological information. The expected result is Negative.  Fact Sheet for Patients:  BloggerCourse.com  Fact Sheet for Healthcare Providers:  SeriousBroker.it  This test is no t yet approved or cleared by the [place] FDA and  has been authorized for detection and/or diagnosis of SARS-CoV-2 by FDA under an Emergency Use Authorization (EUA). This EUA will remain  in effect (meaning this test can be used) for the duration of the COVID-19 declaration under Section 564(b)(1) of the Act, 21 U.S.C.section 360bbb-3(b)(1), unless the authorization is terminated  or revoked sooner.       Influenza A by PCR NEGATIVE NEGATIVE Final   Influenza B by PCR NEGATIVE NEGATIVE Final    Comment: (NOTE) The Xpert Xpress SARS-CoV-2/FLU/RSV plus assay is intended as an aid in the diagnosis of influenza from Nasopharyngeal swab specimens and should not be used as a sole basis for treatment. Nasal washings and aspirates are unacceptable for Xpert Xpress SARS-CoV-2/FLU/RSV testing.  Fact Sheet for Patients: BloggerCourse.com  Fact Sheet for Healthcare Providers: SeriousBroker.it  This test is not yet approved or cleared by the [place] FDA and has been authorized for detection and/or diagnosis of SARS-CoV-2 by FDA under an Emergency Use Authorization (EUA).  This EUA will remain in effect (meaning this test can be used) for the duration of the COVID-19 declaration under Section 564(b)(1) of the Act, 21 U.S.C. section 360bbb-3(b)(1), unless the authorization is terminated or revoked.  Performed at [facility], [address] [place], [place] [address]     Radiology  Reports CT ABDOMEN PELVIS W CONTRAST  Result Date: 07/21/2020 CLINICAL DATA:  Back pain. Prior lumbar surgery with overlying wound. Abdominal pain. Rule out abscess. EXAM: CT ABDOMEN AND PELVIS WITH CONTRAST TECHNIQUE: Multidetector CT imaging of the abdomen and pelvis was performed using the standard protocol following bolus administration of intravenous contrast. CONTRAST:  OMNIPAQUE IOHEXOL 300 MG/ML  SOLN COMPARISON:  05/09/2020 FINDINGS: Lower chest: Mild atelectasis in the lung bases. No pleural effusion. Coronary atherosclerosis. Hepatobiliary: A few subcentimeter hypodensities in the liver too small to fully characterize. Moderate gallbladder distension is similar to the prior study as is mild extrahepatic biliary dilatation. Pancreas: Borderline to mild pancreatic ductal dilatation. No peripancreatic inflammation. Spleen: Unremarkable. Adrenals/Urinary Tract: Unremarkable adrenal glands. No evidence of renal mass, calculi, or hydronephrosis. Under distended bladder with appearance of circumferential wall thickening. Stomach/Bowel: The stomach is unremarkable. There is a moderately large amount of stool in the colon and rectum, much greater than on the prior study. There is no evidence of bowel obstruction or inflammation. Vascular/Lymphatic: Abdominal aortic atherosclerosis without aneurysm. New infrarenal IVC filter. Heterogeneously diminished enhancement of the right common femoral vein with question of a central filling defect and partially visualized asymmetric right thigh soft tissue edema. Reproductive: Suspected small calcified uterine fibroid. No adnexal mass. Other: No intraperitoneal free fluid, fluid collection, or pneumoperitoneum. Musculoskeletal: Scoliosis and postoperative changes in the lumbar spine with overlying skin breakdown, evaluated in detail on the separate lumbar spine CT. Old right-sided pubic ramus fractures. IMPRESSION: 1. Possible deep venous thrombosis in the right  common femoral vein with asymmetric right thigh soft tissue edema. There was DVT more distally in the right leg on a 05/06/2020 ultrasound. 2. Suspected circumferential bladder wall thickening despite underdistention. Correlate for cystitis. 3. No evidence of intra-abdominal abscess. Lumbar spine reported separately. 4. Moderately large colonic stool burden. No evidence of bowel obstruction. 5. Aortic Atherosclerosis (ICD10-I70.0). Electronically Signed   By: Sebastian Ache M.D.   On: 07/21/2020 17:37   CT L-SPINE NO CHARGE  Result Date: 07/21/2020 CLINICAL DATA:  Previous spinal surgery with poorly healing wound. Metal exposure 2 physical exam. EXAM: CT LUMBAR SPINE WITHOUT CONTRAST TECHNIQUE: Multidetector CT imaging of the lumbar spine was performed without intravenous contrast administration. Multiplanar CT image reconstructions were also generated. COMPARISON:  CT 05/09/2020 FINDINGS: Segmentation: 5 lumbar type vertebral bodies assumed. Alignment: Fixed scoliotic curvature convex to the left. Kyphotic curvature of the spine at T12-L1. Vertebrae: Previous fusion procedure from L1 to the sacrum with pedicle screws and posterior rods. Pedicle screws on the right at L1, L3, L4, L5 and S1. Pedicle screws on the left at L1, L2, L3, L5 and S1. There appears to be solid union throughout the region. No evidence of hardware fracture or discontinuity. Left-sided screw at L5 enters the disc space, but this does not appear to be associated with any unfavorable finding. Left-sided screw at L2 enters the disc space but this does not appear to be associated with any unfavorable finding. Paraspinal and other soft tissues: Posterior soft tissue skin breakdown posterior to the region of fusion. Based on the clinical history of hardware being visible, I assume this is related to  the right bracket at L1. The left bracket at L1 is just beneath the skin but not apparently associated with an overlying skin defect. No evidence of  frank soft tissue abscess. Disc levels: As noted above, there appears to be solid union from L1 to the sacrum. Sufficient patency of the canal and foramina. IMPRESSION: Posterior soft tissue skin breakdown posterior to the region of fusion. Based on the clinical history of hardware being visible, I assume this is related to the right bracket at L1. The left bracket at L1 is just beneath the skin but not apparently associated with an overlying skin defect. No evidence of frank soft tissue abscess. Electronically Signed   By: Paulina Fusi M.D.   On: 07/21/2020 16:52   VAS [place] LOWER EXTREMITY VENOUS (DVT)  Result Date: 07/22/2020  Lower Venous DVT Study Indications: History of DVT, worsening RT leg swelling, F/U CT abdomen imaging findings.  Anticoagulation: Xarelto, IVC filter placed. Limitations: Poor ultrasound/tissue interface. Comparison Study: 05-07-2020 Prior bilateral lower extremity venous study                   available for comparison. Performing Technologist: Jean Rosenthal RDMS  Examination Guidelines: A complete evaluation includes B-mode imaging, spectral Doppler, color Doppler, and power Doppler as needed of all accessible portions of each vessel. Bilateral testing is considered an integral part of a complete examination. Limited examinations for reoccurring indications may be performed as noted. The reflux portion of the exam is performed with the patient in reverse Trendelenburg.  +---------+---------------+---------+-----------+----------+-------------------+ RIGHT    CompressibilityPhasicitySpontaneityPropertiesThrombus Aging      +---------+---------------+---------+-----------+----------+-------------------+ CFV      Partial        Yes      Yes                  Age Indeterminate   +---------+---------------+---------+-----------+----------+-------------------+ SFJ      Full           Yes      Yes                                       +---------+---------------+---------+-----------+----------+-------------------+ FV Prox  None           No       No                   Age Indeterminate   +---------+---------------+---------+-----------+----------+-------------------+ FV Mid   Full           Yes      Yes                                      +---------+---------------+---------+-----------+----------+-------------------+ FV DistalFull           Yes      Yes                                      +---------+---------------+---------+-----------+----------+-------------------+ PFV      Partial        Yes      Yes                  Age Indeterminate   +---------+---------------+---------+-----------+----------+-------------------+ POP      Full  Yes      Yes                                      +---------+---------------+---------+-----------+----------+-------------------+ PTV                                                   Not well visualized +---------+---------------+---------+-----------+----------+-------------------+ PERO                                                  Not well visualized +---------+---------------+---------+-----------+----------+-------------------+   +---------+---------------+---------+-----------+----------+--------------+ LEFT     CompressibilityPhasicitySpontaneityPropertiesThrombus Aging +---------+---------------+---------+-----------+----------+--------------+ CFV      Full           Yes      Yes                                 +---------+---------------+---------+-----------+----------+--------------+ SFJ      Full                                                        +---------+---------------+---------+-----------+----------+--------------+ FV Prox  Full                                                        +---------+---------------+---------+-----------+----------+--------------+ FV Mid   Full                                                         +---------+---------------+---------+-----------+----------+--------------+ FV DistalFull                                                        +---------+---------------+---------+-----------+----------+--------------+ PFV      Full                                                        +---------+---------------+---------+-----------+----------+--------------+ POP      Full           Yes      Yes                                 +---------+---------------+---------+-----------+----------+--------------+ PTV      Full                                                        +---------+---------------+---------+-----------+----------+--------------+  PERO     Full                                                        +---------+---------------+---------+-----------+----------+--------------+    Summary: RIGHT: - Findings consistent with 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 - A cystic structure is found in the popliteal fossa.  LEFT: - There is no evidence of deep vein thrombosis in the lower extremity.  - No cystic structure found in the popliteal fossa.  *See table(s) above for measurements and observations.    Preliminary      Time Spent in minutes  30     Laverna Peace M.D on 07/23/2020 at 2:36 PM  To page go to [email or web address] - password [email or web address]

## 2020-07-23 NOTE — Progress Notes (Signed)
Ring is at bedside. Will update son.

## 2020-07-23 NOTE — Progress Notes (Signed)
Discussed w/ Dr. Caleb Popp and reviewed pt's records, complex patient with a h/o lumbar fusion in 2012 with Dr. 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 Hardware is visible in the patient's wound unfortunately. Sounds like she has responded well to Dr. Dennison Nancy efforts and the sepsis is improving.   From review of her CT, it appears that her construct has healed well and she is fused across those segments and therefore does not need hardware any longer. There is wound breakdown over at least the top screw on the right. Given that the hardware is grossly contaminated and she has a decubitus wound, this would potentially require a more complex reconstruction with plastics. I will let Dr. Danielle Dess know about the above and have him follow up with the patient for further care. No need for urgent transfer or surgical intervention at this time.

## 2020-07-24 ENCOUNTER — Other Ambulatory Visit: Payer: Self-pay | Admitting: Neurological Surgery

## 2020-07-24 LAB — MAGNESIUM: Magnesium: 2.1 mg/dL (ref 1.7–2.4)

## 2020-07-24 LAB — CBC
HCT: 31.3 % — ABNORMAL LOW (ref 36.0–46.0)
Hemoglobin: 9.2 g/dL — ABNORMAL LOW (ref 12.0–15.0)
MCH: 28 pg (ref 26.0–34.0)
MCHC: 29.4 g/dL — ABNORMAL LOW (ref 30.0–36.0)
MCV: 95.4 fL (ref 80.0–100.0)
Platelets: 343 10*3/uL (ref 150–400)
RBC: 3.28 MIL/uL — ABNORMAL LOW (ref 3.87–5.11)
RDW: 16.3 % — ABNORMAL HIGH (ref 11.5–15.5)
WBC: 10.2 10*3/uL (ref 4.0–10.5)
nRBC: 0.3 % — ABNORMAL HIGH (ref 0.0–0.2)

## 2020-07-24 LAB — COMPREHENSIVE METABOLIC PANEL
ALT: 54 U/L — ABNORMAL HIGH (ref 0–44)
AST: 41 U/L (ref 15–41)
Albumin: 2 g/dL — ABNORMAL LOW (ref 3.5–5.0)
Alkaline Phosphatase: 55 U/L (ref 38–126)
Anion gap: 11 (ref 5–15)
BUN: 9 mg/dL (ref 8–23)
CO2: 24 mmol/L (ref 22–32)
Calcium: 8.8 mg/dL — ABNORMAL LOW (ref 8.9–10.3)
Chloride: 109 mmol/L (ref 98–111)
Creatinine, Ser: 0.45 mg/dL (ref 0.44–1.00)
GFR, Estimated: >60 mL/min (ref >60)
Glucose, Bld: 110 mg/dL — ABNORMAL HIGH (ref 70–99)
Potassium: 3.7 mmol/L (ref 3.5–5.1)
Sodium: 144 mmol/L (ref 135–145)
Total Bilirubin: 0.6 mg/dL (ref 0.3–1.2)
Total Protein: 6.2 g/dL — ABNORMAL LOW (ref 6.5–8.1)

## 2020-07-24 LAB — SEDIMENTATION RATE: Sed Rate: 90 mm/hr — ABNORMAL HIGH (ref 0–22)

## 2020-07-24 LAB — C-REACTIVE PROTEIN: CRP: 3.7 mg/dL — ABNORMAL HIGH (ref <1.0)

## 2020-07-24 NOTE — Progress Notes (Signed)
[facility]   Reason for visit: Follow up on hardware exposure  Interval History: no acute events. No fever, no significant leukocytosis.  No complaints.      Physical Exam: Constitutional:  Vitals:   07/24/20 0617 07/24/20 1052  BP: 130/67 (!) 156/64  Pulse: 82 80  Resp: 16 18  Temp: 98.9 F (37.2 C) 98.8 F (37.1 C)  SpO2: 99% 100%   patient appears in NAD Respiratory: Normal respiratory effort; CTA B Cardiovascular: RRR GI: soft, nt, nd  Review of Systems: Constitutional: negative for fevers and chills Gastrointestinal: negative for nausea and diarrhea Integument/breast: negative for rash  Lab Results  Component Value Date   WBC 10.2 07/24/2020   HGB 9.2 (L) 07/24/2020   HCT 31.3 (L) 07/24/2020   MCV 95.4 07/24/2020   PLT 343 07/24/2020    Lab Results  Component Value Date   CREATININE 0.45 07/24/2020   BUN 9 07/24/2020   NA 144 07/24/2020   K 3.7 07/24/2020   CL 109 07/24/2020   CO2 24 07/24/2020    Lab Results  Component Value Date   ALT 54 (H) 07/24/2020   AST 41 07/24/2020   ALKPHOS 55 07/24/2020     Microbiology: Recent Results (from the past 240 hour(s))  Aerobic Culture (superficial specimen)     Status: None (Preliminary result)   Collection Time: 07/21/20 12:00 PM   Specimen: Wound  Result Value Ref Range Status   Specimen Description   Final    WOUND BACK Performed at [facility], [address], [place], [place] [address]    Special Requests   Final    NONE Performed at [facility], [address], [place], [place] [address]    Gram Stain   Final    ABUNDANT WBC PRESENT,BOTH PMN AND MONONUCLEAR ABUNDANT GRAM NEGATIVE RODS MODERATE GRAM POSITIVE COCCI    Culture   Final    FEW ESCHERICHIA COLI ABUNDANT STREPTOCOCCUS ANGINOSIS SUSCEPTIBILITIES TO FOLLOW Performed at [facility], [address], [place], [place] [address]    Report Status PENDING   Incomplete   Organism ID, Bacteria ESCHERICHIA COLI  Final      Susceptibility   Escherichia coli - MIC*    AMPICILLIN <=2 SENSITIVE Sensitive     CEFAZOLIN <=4 SENSITIVE Sensitive     CEFEPIME <=0.12 SENSITIVE Sensitive     CEFTAZIDIME <=1 SENSITIVE Sensitive     CEFTRIAXONE <=0.25 SENSITIVE Sensitive     CIPROFLOXACIN <=0.25 SENSITIVE Sensitive     GENTAMICIN <=1 SENSITIVE Sensitive     IMIPENEM <=0.25 SENSITIVE Sensitive     TRIMETH/SULFA <=20 SENSITIVE Sensitive     AMPICILLIN/SULBACTAM <=2 SENSITIVE Sensitive     PIP/TAZO <=4 SENSITIVE Sensitive     * FEW ESCHERICHIA COLI  Blood culture (routine x 2)     Status: None (Preliminary result)   Collection Time: 07/21/20  1:40 PM   Specimen: BLOOD RIGHT WRIST  Result Value Ref Range Status   Specimen Description   Final    BLOOD RIGHT WRIST Performed at [facility], [address] [place], [place] [address]    Special Requests   Final    BOTTLES DRAWN AEROBIC AND ANAEROBIC Blood Culture adequate volume Performed at [facility], [address], [place], [place] [address]    Culture   Final    NO GROWTH 3 DAYS Performed at [facility], [address] [place], [place] [address]    Report Status PENDING  Incomplete  Blood culture (routine x 2)     Status: None (Preliminary result)   Collection Time: 07/21/20  1:52 PM   Specimen: BLOOD  Result Value Ref Range Status   Specimen Description   Final    BLOOD RIGHT ANTECUBITAL Performed at [facility], [address], [place], [place] [address]    Special Requests   Final    BOTTLES DRAWN AEROBIC AND ANAEROBIC Blood Culture results may not be optimal due to an inadequate volume of blood received in culture bottles Performed at [facility], [address], [place], [place] [address]    Culture   Final    NO GROWTH 3 DAYS Performed at [facility], [address] [place], [place]  [address]    Report Status PENDING  Incomplete  Resp Panel by RT-PCR (Flu A&B, Covid) Nasopharyngeal Swab     Status: None   Collection Time: 07/21/20  1:57 PM   Specimen: Nasopharyngeal Swab; Nasopharyngeal(NP) swabs in vial transport medium  Result Value Ref Range Status   SARS Coronavirus 2 by RT PCR NEGATIVE NEGATIVE Final    Comment: (NOTE) SARS-CoV-2 target nucleic acids are NOT DETECTED.  The SARS-CoV-2 RNA is generally detectable in upper respiratory specimens during the acute phase of infection. The lowest concentration of SARS-CoV-2 viral copies this assay can detect is 138 copies/mL. A negative result does not preclude SARS-Cov-2 infection and should not be used as the sole basis for treatment or other patient management decisions. A negative result may occur with  improper specimen collection/handling, submission of specimen other than nasopharyngeal swab, presence of viral mutation(s) within the areas targeted by this assay, and inadequate number of viral copies(<138 copies/mL). A negative result must be combined with clinical observations, patient history, and epidemiological information. The expected result is Negative.  Fact Sheet for Patients:  BloggerCourse.com  Fact Sheet for Healthcare Providers:  SeriousBroker.it  This test is no t yet approved or cleared by the [place] FDA and  has been authorized for detection and/or diagnosis of SARS-CoV-2 by FDA under an Emergency Use Authorization (EUA). This EUA will remain  in effect (meaning this test can be used) for the duration of the COVID-19 declaration under Section 564(b)(1) of the Act, 21 U.S.C.section 360bbb-3(b)(1), unless the authorization is terminated  or revoked sooner.       Influenza A by PCR NEGATIVE NEGATIVE Final   Influenza B by PCR NEGATIVE NEGATIVE Final    Comment: (NOTE) The Xpert Xpress SARS-CoV-2/FLU/RSV plus assay is intended as an  aid in the diagnosis of influenza from Nasopharyngeal swab specimens and should not be used as a sole basis for treatment. Nasal washings and aspirates are unacceptable for Xpert Xpress SARS-CoV-2/FLU/RSV testing.  Fact Sheet for Patients: BloggerCourse.com  Fact Sheet for Healthcare Providers: SeriousBroker.it  This test is not yet approved or cleared by the [place] FDA and has been authorized for detection and/or diagnosis of SARS-CoV-2 by FDA under an Emergency Use Authorization (EUA). This EUA will remain in effect (meaning this test can be used) for the duration of the COVID-19 declaration under Section 564(b)(1) of the Act, 21 U.S.C. section 360bbb-3(b)(1), unless the authorization is terminated or revoked.  Performed at [facility], [address] [place], [place] [address]     Impression/Plan:  1. Exposed hardware - open wound with hardware and no acute drainage or signs of infection  other than being exposed.  Waiting on surgical management for definitive therapy followed by wound care.  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   No indication for antibiotics at this time.   2. Sepsis - no current signs of sepsis or infection.

## 2020-07-24 NOTE — Care Management Important Message (Signed)
Important Message  Patient Details IM Letter given to the Patient. Name: [name] MRN: [id number] Date of Birth: 06-15-1941   Medicare Important Message Given:  Yes     Caren Macadam 07/24/2020, 1:26 PM

## 2020-07-24 NOTE — Progress Notes (Signed)
Patient ID: [name], female   DOB: 09-08-1940, 79 y.o.   MRN: [id number] Had a phone conversation with patient's expressed his frustration stating that his mother wants was sent home with the bandage on her back then when the family days later they noted the ulcer.  This was well previous hospitalization.  I explained to him the problem of dealing with this process and the fact that the breathing removing the hardware may not allow this process to heal nonetheless they feel that something definitive to be done.  We will discuss transferring to go for surgical debridement in the next several days.

## 2020-07-24 NOTE — Progress Notes (Signed)
Patient ID: [name], female   DOB: 07-16-1941, 79 y.o.   MRN: [id number] Patient is awake conversant but disoriented. Seems to recall me.   With the nurses help I examined the wound and noted the exposed hardware as in the picture.  I certainly agree that this will not heal with hardware in place, though  I have concern that this will heal at all even with hardware removed.  I will discuss with family and attending re surgical debridement.

## 2020-07-24 NOTE — Consult Note (Signed)
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  Seen at wound care center and palliative care.  Treated for sepsis to this wound.  Neurosurgery has recommended removal of hardware since fusion of bone has healed.  To follow up once infection is cleared if family wishes to. History of vascular dementia.   Wound type:infectious stage 4 pressure injury to previously healed surgical site.  Pressure Injury POA: Yes Measurement: 6 cm x 4.5 cm some slough and visible spinal hardware present to wound bed.  Wound bed:10%  hardware, 15 % yellow slough, 75% pale pink, slick nongranulating.   Drainage (amount, consistency, odor) moderate serosanguinous musty, sour odor.  Periwound: stage 2 pressure injury distal to open wound.  2.5 cm x 2 cm x 0.2 cm pale pink nongranulating  No odor.  Dressing procedure/placement/frequency: Cleanse thoracic wound with NS and pat dry.  Apply Aquacel Ag to wound bed.  Fill dead space.  Cover with dry gauze and ABD pad.  Silicone foam to distal pressure injury.  Change Mon/Wed/Fri.  Will not follow at this time.  Please re-consult if needed.  Maple Hudson MSN, RN, FNP-BC CWON Wound, Ostomy, Continence Nurse Pager [phone]

## 2020-07-24 NOTE — Progress Notes (Signed)
[facility]  PROGRESS NOTE  [name] ZOX:[id number] DOB: [birth year] DOA: 07/21/2020 PCP: [name], MD Admit date - 07/21/2020   Admitting Physician Laverna Peace, MD  Outpatient Primary MD for the patient is [name], MD  LOS - 3 Brief Narrative  [name] is a 79 y.o. 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  In the ED patient was afebrile, blood pressure range 105/91-1 13/35, tachycardic with heart rate 97-1 04, WBC 11.6, calcium 6.3, CO2 19, lactic acid 3 and CT abdomen showing possible DVT of right common femoral vein as well as bladder wall thickening and large colonic stool burden, CT lumbar spine showing skin breakdown at region of fusion with visible hardware and no evidence of soft tissue abscess. Patient was started on vancomycin and cefepime and blood cultures were obtained prior to antibiotics. [facility] was called for further management  Last hospitalization from 10/13-10/19 for AKI while at inpatient rehab and prior hospitalization for syncope on 9/24 and found to have saddle PE with cor pulmonale treated with TPA complicated by left upper  extremity hematoma requiring transfusion, right hip cellulitis treated with a vancomycin and E. coli treated with Keflex with IVC filter placed on 9/29. Per chart review after last hospitalization patient was discharged home with hospice.  Per chart review (06/19/2020) has [facility] outpatient palliative services. She is not under hospice care, but per her MOST form is agreeable to IVF and IV antibiotics to treat what is treatable. Also states that patient has had infected thoracic spine decubitus ulcer with exposed hardware that was being treated with Santyl and wet to dry dressings  Subjective  In more pain this afternoon. Son at bedside. Last pain medication a few hours ago. 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  Sepsis physiology now resolved presented with leukocytosis, tachycardia, lactic acidosis of 3 with likely source being open unstageable ulcer at lumbar spine with exposed metal, CT lumbar spine shows no evidence of abscess. Has significant amount of pain while laying on back. Status post fluid bolus in ED and initiation of empiric antibiotics. Lactic acidosis has resolved, no new fevers, leukocytosis stable while off antibiotics since 07/23/20 as recommended by ID -Pain control IV morphine 1 mg as needed severe pain, continue home oxycodone IR 7.5 mg 4 times daily as needed -Empiric vancomycin, cefepime discontinued on 12/12 given no sepsis physiology per ID recommendations --If becomes febrile or demonstrates evidence of sepsis can resume empiric antibiotics -Monitor blood cultures -Monitor on telemetry --Wound care consulted -Neurosurgery has discussed with family who elects for surgical debridement and removal of metal hardware, which will require transfer to [facility], will likely  happen on 12/15, 12/14 possible, will make  n.p.o. at midnight.  In the meantime we will continue wet-to-dry dressings  Exposed hardware from surgical decompression and arthrodesis from L1-S1 on 03/31/2006 secondary to symptomatic spondylitic stenosis with idiopathic scoliosis.  Per note on 06/19/20, patient has had decubitus ulcer with exposed hardware being treated at home with wet to dry dressings and santyl. In speaking with family they noted increase odorous drainage which prompted evaluation at wound care center who recommended she be evaluated in ED which led to this admission -Neurosurgery plans for surgical debridement either on 12/14 or 12/15,make n.p.o. at midnight, will need transfer to [facility]  Increasing right leg edema with a history of prior DVTs/PE. Patient has IVC filter in has been adherent with Eliquis per family. CT abdomen imaging concerning for worsening edema that may be related to new clot burden with right common femoral vein.  Venous duplex also shows proximal right common femoral vein DVT of indeterminate age -Status post IVC filter last hospitalization in September of this year -Continue home Eliquis  Hypocalcemia, resovled Albumin is very low too so may not be quite as low as it seems. Likely in setting of sepsis. Ca now wnl. Mg wnl  Mild elevation in LFTs. Could be correlated with sepsis physiology pain stable. No abdominal pain currently.  Hepatitis panel unremarkable  -Trend CMP remains,  -Hold home statin  Incidental finding of bladder wall thickening. UA unremarkable -continue to monitor  Moderately large colonic stool burden without evidence of bowel obstruction is noted on CT abdomen. In the setting of opioid regimen movements -Optimize bowel regimen with scheduled stool softeners and MiraLAX and closely monitor -Monitor output  History of submassive bilateral saddle PE and right and left DVT ( 9/20201). Status post IVC filter, adherent to Eliquis at home per son. Stable on room air.  No  concern for bleeding currently. CT imaging on admission showed concern for new right-sided DVT of proximal femoral vein -Continue Eliquis -Has IVC filter in place  Left upper extremity swelling in setting of hematoma from prior hospitalization secondary to TPA. -Elevate extremity, swelling does not improve may warrant venous duplex of upper extremity as well  Chronic anemia, normocytic, hemoglobin stable at baseline 8-9. No bleeding episodes. -Monitor CBC while on Eliquis  Hypertension. This morning blood pressure is a little bit softer with last SB BP of 105 and diastolic over 91. Given concurrent sepsis physiology will hold off on home blood pressure regimen -Holding home BP meds -Continue to closely monitor  Dementia with behavior disturbances, stable. Alert to self and place and year. -Continue BuSpar, Aricept, Namenda delirium precautions     Family Communication  : Updated wife per son requests on phone on 12/13  Code Status :  Limited Code. Ok with CPR but no intubation in discussion with patient's son on day of admission on the phone   Disposition Plan  :  Patient is from home. Anticipated d/c date:  Greater than 3 days. Barriers to d/c or necessity for inpatient status:  Monitoring off IV antibiotics given no current sepsis physiology as recommended by ID, transfer to [facility] is a requires neurosurgical debridement, possible plan for 12/14 or 12/15, will make n.p.o. at midnight Consults  :  WOund, ID, neurosurgery  Procedures  : Venous duplex  DVT Prophylaxis  :  Home eliquis MDM: The below labs and imaging reports were reviewed and summarized above.  Medication management as above.  Lab Results  Component Value Date  PLT 343 07/24/2020    Diet :  Diet Order            DIET DYS 2 Room service appropriate? Yes; Fluid consistency: Thin  Diet effective now                  Inpatient Medications Scheduled Meds: . apixaban  5 mg Oral BID  . busPIRone  15  mg Oral BID  . donepezil  10 mg Oral QHS  . dronabinol  2.5 mg Oral BID AC  . feeding supplement  237 mL Oral TID BM  . linaclotide  145 mcg Oral Daily  . loratadine  10 mg Oral Daily  . memantine  28 mg Oral QHS  . polyethylene glycol  17 g Oral BID  . senna-docusate  1 tablet Oral BID   Continuous Infusions:  PRN Meds:.acetaminophen **OR** acetaminophen, cycloSPORINE, morphine injection, oxyCODONE  Antibiotics  :   Anti-infectives (From admission, onward)   Start     Dose/Rate Route Frequency Ordered Stop   07/23/20 1600  metroNIDAZOLE (FLAGYL) tablet 500 mg  Status:  Discontinued        500 mg Oral Every 8 hours 07/23/20 1433 07/23/20 1613   07/22/20 1830  vancomycin (VANCOREADY) IVPB 750 mg/150 mL  Status:  Discontinued        750 mg 150 mL/hr over 60 Minutes Intravenous Every 24 hours 07/21/20 1829 07/23/20 1613   07/22/20 0200  ceFEPIme (MAXIPIME) 2 g in sodium chloride 0.9 % 100 mL IVPB  Status:  Discontinued        2 g 200 mL/hr over 30 Minutes Intravenous Every 12 hours 07/21/20 1803 07/23/20 1613   07/21/20 1900  vancomycin (VANCOREADY) IVPB 500 mg/100 mL        500 mg 100 mL/hr over 60 Minutes Intravenous  Once 07/21/20 1803 07/21/20 2033   07/21/20 1345  ceFEPIme (MAXIPIME) 2 g in sodium chloride 0.9 % 100 mL IVPB        2 g 200 mL/hr over 30 Minutes Intravenous  Once 07/21/20 1331 07/21/20 1415   07/21/20 1330  vancomycin (VANCOCIN) IVPB 1000 mg/200 mL premix        1,000 mg 200 mL/hr over 60 Minutes Intravenous  Once 07/21/20 1328 07/21/20 1520   07/21/20 1330  ceFEPIme (MAXIPIME) 1 g in sodium chloride 0.9 % 100 mL IVPB  Status:  Discontinued        1 g 200 mL/hr over 30 Minutes Intravenous  Once 07/21/20 1328 07/21/20 1331       Objective   Vitals:   07/23/20 2342 07/24/20 0500 07/24/20 0617 07/24/20 1052  BP: (!) 155/77  130/67 (!) 156/64  Pulse: 79  82 80  Resp: 16  16 18   Temp: 98.8 F (37.1 C)  98.9 F (37.2 C) 98.8 F (37.1 C)  TempSrc: Oral   Oral Oral  SpO2: 95%  99% 100%  Weight:  80 kg    Height:        SpO2: 100 %  Wt Readings from Last 3 Encounters:  07/24/20 80 kg  05/25/20 93 kg  05/13/20 93 kg     Intake/Output Summary (Last 24 hours) at 07/24/2020 1504 Last data filed at 07/24/2020 1000 Gross per 24 hour  Intake 540 ml  Output --  Net 540 ml    Physical Exam:     Awake Alert, Oriented self, place,  no distress, follows commands No new F.N deficits,  Bear.AT, Normal respiratory effort on  room air, CTAB RRR,No Gallops,Rubs or new Murmurs, 1+ pitting edema of right lower extremity from ankle up to thigh Lue edema ( from hand to elbow), chronic per son ( though slightly worse from pictures he shows me) +ve B.Sounds, Abd Soft, No tenderness, No rebound, guarding or rigidity. From pics on admission on 12/10         I have personally reviewed the following:   Data Reviewed:  CBC Recent Labs  Lab 07/21/20 1340 07/22/20 0435 07/23/20 0458 07/24/20 0418  WBC 11.1* 11.6* 11.9* 10.2  HGB 12.3 9.6* 9.3* 9.2*  HCT 40.5 32.4* 31.8* 31.3*  PLT 386 357 336 343  MCV 94.4 96.1 96.4 95.4  MCH 28.7 28.5 28.2 28.0  MCHC 30.4 29.6* 29.2* 29.4*  RDW 16.8* 16.2* 16.2* 16.3*  LYMPHSABS 1.3  --   --   --   MONOABS 0.5  --   --   --   EOSABS 0.1  --   --   --   BASOSABS 0.0  --   --   --     Chemistries  Recent Labs  Lab 07/21/20 1403 07/22/20 0435 07/23/20 0458 07/23/20 1712 07/24/20 0418  NA 143 144 143 141 144  K 4.4 3.7 3.1* 3.4* 3.7  CL 119* 109 109 110 109  CO2 19* 25 23 23 24   GLUCOSE 95 93 86 120* 110*  BUN 8 11 8 9 9   CREATININE 0.67 0.54 0.40* 0.43* 0.45  CALCIUM 6.3* 8.9 8.6* 8.4* 8.8*  MG  --  2.2 2.0  --  2.1  AST 75* 54* 38  --  41  ALT 58* 67* 55*  --  54*  ALKPHOS 46 63 56  --  55  BILITOT 2.0* 0.5 0.8  --  0.6   ------------------------------------------------------------------------------------------------------------------ No results for input(s): CHOL, HDL,  LDLCALC, TRIG, CHOLHDL, LDLDIRECT in the last 72 hours.  Lab Results  Component Value Date   HGBA1C 5.2 05/06/2020   ------------------------------------------------------------------------------------------------------------------ No results for input(s): TSH, T4TOTAL, T3FREE, THYROIDAB in the last 72 hours.  Invalid input(s): FREET3 ------------------------------------------------------------------------------------------------------------------ No results for input(s): VITAMINB12, FOLATE, FERRITIN, TIBC, IRON, RETICCTPCT in the last 72 hours.  Coagulation profile No results for input(s): INR, PROTIME in the last 168 hours.  No results for input(s): DDIMER in the last 72 hours.  Cardiac Enzymes No results for input(s): CKMB, TROPONINI, MYOGLOBIN in the last 168 hours.  Invalid input(s): CK ------------------------------------------------------------------------------------------------------------------    Component Value Date/Time   BNP 175.9 (H) 05/06/2020 0123    Micro Results Recent Results (from the past 240 hour(s))  Aerobic Culture (superficial specimen)     Status: None (Preliminary result)   Collection Time: 07/21/20 12:00 PM   Specimen: Wound  Result Value Ref Range Status   Specimen Description   Final    WOUND BACK Performed at [facility], [address], [place], [place]    Special Requests   Final    NONE Performed at [facility], [address], [place], [place]    Gram Stain   Final    ABUNDANT WBC PRESENT,BOTH PMN AND MONONUCLEAR ABUNDANT GRAM NEGATIVE RODS MODERATE GRAM POSITIVE COCCI    Culture   Final    FEW ESCHERICHIA COLI ABUNDANT STREPTOCOCCUS ANGINOSIS SUSCEPTIBILITIES TO FOLLOW Performed at [facility], [address], [place], [address] [place]    Report Status PENDING  Incomplete   Organism ID, Bacteria ESCHERICHIA COLI  Final  Susceptibility   Escherichia coli  - MIC*    AMPICILLIN <=2 SENSITIVE Sensitive     CEFAZOLIN <=4 SENSITIVE Sensitive     CEFEPIME <=0.12 SENSITIVE Sensitive     CEFTAZIDIME <=1 SENSITIVE Sensitive     CEFTRIAXONE <=0.25 SENSITIVE Sensitive     CIPROFLOXACIN <=0.25 SENSITIVE Sensitive     GENTAMICIN <=1 SENSITIVE Sensitive     IMIPENEM <=0.25 SENSITIVE Sensitive     TRIMETH/SULFA <=20 SENSITIVE Sensitive     AMPICILLIN/SULBACTAM <=2 SENSITIVE Sensitive     PIP/TAZO <=4 SENSITIVE Sensitive     * FEW ESCHERICHIA COLI  Blood culture (routine x 2)     Status: None (Preliminary result)   Collection Time: 07/21/20  1:40 PM   Specimen: BLOOD RIGHT WRIST  Result Value Ref Range Status   Specimen Description   Final    BLOOD RIGHT WRIST Performed at [facility], [address], [place], [place] [address]    Special Requests   Final    BOTTLES DRAWN AEROBIC AND ANAEROBIC Blood Culture adequate volume Performed at [facility], [address] [place], [place] [address]    Culture   Final    NO GROWTH 3 DAYS Performed at [facility], [address], [place], [place] [address]    Report Status PENDING  Incomplete  Blood culture (routine x 2)     Status: None (Preliminary result)   Collection Time: 07/21/20  1:52 PM   Specimen: BLOOD  Result Value Ref Range Status   Specimen Description   Final    BLOOD RIGHT ANTECUBITAL Performed at [facility], [address] [place], [place] [address]    Special Requests   Final    BOTTLES DRAWN AEROBIC AND ANAEROBIC Blood Culture results may not be optimal due to an inadequate volume of blood received in culture bottles Performed at [facility], [address] [place], [place] [address]    Culture   Final    NO GROWTH 3 DAYS Performed at [facility], [address], [place], [place] [address]    Report Status PENDING  Incomplete  Resp Panel by RT-PCR (Flu A&B, Covid) Nasopharyngeal Swab      Status: None   Collection Time: 07/21/20  1:57 PM   Specimen: Nasopharyngeal Swab; Nasopharyngeal(NP) swabs in vial transport medium  Result Value Ref Range Status   SARS Coronavirus 2 by RT PCR NEGATIVE NEGATIVE Final    Comment: (NOTE) SARS-CoV-2 target nucleic acids are NOT DETECTED.  The SARS-CoV-2 RNA is generally detectable in upper respiratory specimens during the acute phase of infection. The lowest concentration of SARS-CoV-2 viral copies this assay can detect is 138 copies/mL. A negative result does not preclude SARS-Cov-2 infection and should not be used as the sole basis for treatment or other patient management decisions. A negative result may occur with  improper specimen collection/handling, submission of specimen other than nasopharyngeal swab, presence of viral mutation(s) within the areas targeted by this assay, and inadequate number of viral copies(<138 copies/mL). A negative result must be combined with clinical observations, patient history, and epidemiological information. The expected result is Negative.  Fact Sheet for Patients:  BloggerCourse.com  Fact Sheet for Healthcare Providers:  SeriousBroker.it  This test is no t yet approved or cleared by the [place] FDA and  has been authorized for detection and/or diagnosis of SARS-CoV-2 by FDA under an Emergency Use Authorization (EUA). This EUA will remain  in  effect (meaning this test can be used) for the duration of the COVID-19 declaration under Section 564(b)(1) of the Act, 21 U.S.C.section 360bbb-3(b)(1), unless the authorization is terminated  or revoked sooner.       Influenza A by PCR NEGATIVE NEGATIVE Final   Influenza B by PCR NEGATIVE NEGATIVE Final    Comment: (NOTE) The Xpert Xpress SARS-CoV-2/FLU/RSV plus assay is intended as an aid in the diagnosis of influenza from Nasopharyngeal swab specimens and should not be used as a sole  basis for treatment. Nasal washings and aspirates are unacceptable for Xpert Xpress SARS-CoV-2/FLU/RSV testing.  Fact Sheet for Patients: BloggerCourse.com  Fact Sheet for Healthcare Providers: SeriousBroker.it  This test is not yet approved or cleared by the [place] FDA and has been authorized for detection and/or diagnosis of SARS-CoV-2 by FDA under an Emergency Use Authorization (EUA). This EUA will remain in effect (meaning this test can be used) for the duration of the COVID-19 declaration under Section 564(b)(1) of the Act, 21 U.S.C. section 360bbb-3(b)(1), unless the authorization is terminated or revoked.  Performed at [facility], [address] [place], [place] [address]     Radiology Reports CT ABDOMEN PELVIS W CONTRAST  Result Date: 07/21/2020 CLINICAL DATA:  Back pain. Prior lumbar surgery with overlying wound. Abdominal pain. Rule out abscess. EXAM: CT ABDOMEN AND PELVIS WITH CONTRAST TECHNIQUE: Multidetector CT imaging of the abdomen and pelvis was performed using the standard protocol following bolus administration of intravenous contrast. CONTRAST:  OMNIPAQUE IOHEXOL 300 MG/ML  SOLN COMPARISON:  05/09/2020 FINDINGS: Lower chest: Mild atelectasis in the lung bases. No pleural effusion. Coronary atherosclerosis. Hepatobiliary: A few subcentimeter hypodensities in the liver too small to fully characterize. Moderate gallbladder distension is similar to the prior study as is mild extrahepatic biliary dilatation. Pancreas: Borderline to mild pancreatic ductal dilatation. No peripancreatic inflammation. Spleen: Unremarkable. Adrenals/Urinary Tract: Unremarkable adrenal glands. No evidence of renal mass, calculi, or hydronephrosis. Under distended bladder with appearance of circumferential wall thickening. Stomach/Bowel: The stomach is unremarkable. There is a moderately large amount of stool in the  colon and rectum, much greater than on the prior study. There is no evidence of bowel obstruction or inflammation. Vascular/Lymphatic: Abdominal aortic atherosclerosis without aneurysm. New infrarenal IVC filter. Heterogeneously diminished enhancement of the right common femoral vein with question of a central filling defect and partially visualized asymmetric right thigh soft tissue edema. Reproductive: Suspected small calcified uterine fibroid. No adnexal mass. Other: No intraperitoneal free fluid, fluid collection, or pneumoperitoneum. Musculoskeletal: Scoliosis and postoperative changes in the lumbar spine with overlying skin breakdown, evaluated in detail on the separate lumbar spine CT. Old right-sided pubic ramus fractures. IMPRESSION: 1. Possible deep venous thrombosis in the right common femoral vein with asymmetric right thigh soft tissue edema. There was DVT more distally in the right leg on a 05/06/2020 ultrasound. 2. Suspected circumferential bladder wall thickening despite underdistention. Correlate for cystitis. 3. No evidence of intra-abdominal abscess. Lumbar spine reported separately. 4. Moderately large colonic stool burden. No evidence of bowel obstruction. 5. Aortic Atherosclerosis (ICD10-I70.0). Electronically Signed   By: Sebastian Ache M.D.   On: 07/21/2020 17:37   CT L-SPINE NO CHARGE  Result Date: 07/21/2020 CLINICAL DATA:  Previous spinal surgery with poorly healing wound. Metal exposure 2 physical exam. EXAM: CT LUMBAR SPINE WITHOUT CONTRAST TECHNIQUE: Multidetector CT imaging of the lumbar spine was performed without intravenous contrast administration. Multiplanar CT image reconstructions were also generated. COMPARISON:  CT 05/09/2020 FINDINGS: Segmentation: 5  lumbar type vertebral bodies assumed. Alignment: Fixed scoliotic curvature convex to the left. Kyphotic curvature of the spine at T12-L1. Vertebrae: Previous fusion procedure from L1 to the sacrum with pedicle screws and  posterior rods. Pedicle screws on the right at L1, L3, L4, L5 and S1. Pedicle screws on the left at L1, L2, L3, L5 and S1. There appears to be solid union throughout the region. No evidence of hardware fracture or discontinuity. Left-sided screw at L5 enters the disc space, but this does not appear to be associated with any unfavorable finding. Left-sided screw at L2 enters the disc space but this does not appear to be associated with any unfavorable finding. Paraspinal and other soft tissues: Posterior soft tissue skin breakdown posterior to the region of fusion. Based on the clinical history of hardware being visible, I assume this is related to the right bracket at L1. The left bracket at L1 is just beneath the skin but not apparently associated with an overlying skin defect. No evidence of frank soft tissue abscess. Disc levels: As noted above, there appears to be solid union from L1 to the sacrum. Sufficient patency of the canal and foramina. IMPRESSION: Posterior soft tissue skin breakdown posterior to the region of fusion. Based on the clinical history of hardware being visible, I assume this is related to the right bracket at L1. The left bracket at L1 is just beneath the skin but not apparently associated with an overlying skin defect. No evidence of frank soft tissue abscess. Electronically Signed   By: Paulina Fusi M.D.   On: 07/21/2020 16:52   VAS [place] LOWER EXTREMITY VENOUS (DVT)  Result Date: 07/22/2020  Lower Venous DVT Study Indications: History of DVT, worsening RT leg swelling, F/U CT abdomen imaging findings.  Anticoagulation: Xarelto, IVC filter placed. Limitations: Poor ultrasound/tissue interface. Comparison Study: 05-07-2020 Prior bilateral lower extremity venous study                   available for comparison. Performing Technologist: Jean Rosenthal RDMS  Examination Guidelines: A complete evaluation includes B-mode imaging, spectral Doppler, color Doppler, and power Doppler as needed of  all accessible portions of each vessel. Bilateral testing is considered an integral part of a complete examination. Limited examinations for reoccurring indications may be performed as noted. The reflux portion of the exam is performed with the patient in reverse Trendelenburg.  +---------+---------------+---------+-----------+----------+-------------------+ RIGHT    CompressibilityPhasicitySpontaneityPropertiesThrombus Aging      +---------+---------------+---------+-----------+----------+-------------------+ CFV      Partial        Yes      Yes                  Age Indeterminate   +---------+---------------+---------+-----------+----------+-------------------+ SFJ      Full           Yes      Yes                                      +---------+---------------+---------+-----------+----------+-------------------+ FV Prox  None           No       No                   Age Indeterminate   +---------+---------------+---------+-----------+----------+-------------------+ FV Mid   Full           Yes      Yes                                      +---------+---------------+---------+-----------+----------+-------------------+  FV DistalFull           Yes      Yes                                      +---------+---------------+---------+-----------+----------+-------------------+ PFV      Partial        Yes      Yes                  Age Indeterminate   +---------+---------------+---------+-----------+----------+-------------------+ POP      Full           Yes      Yes                                      +---------+---------------+---------+-----------+----------+-------------------+ PTV                                                   Not well visualized +---------+---------------+---------+-----------+----------+-------------------+ PERO                                                  Not well visualized  +---------+---------------+---------+-----------+----------+-------------------+   +---------+---------------+---------+-----------+----------+--------------+ LEFT     CompressibilityPhasicitySpontaneityPropertiesThrombus Aging +---------+---------------+---------+-----------+----------+--------------+ CFV      Full           Yes      Yes                                 +---------+---------------+---------+-----------+----------+--------------+ SFJ      Full                                                        +---------+---------------+---------+-----------+----------+--------------+ FV Prox  Full                                                        +---------+---------------+---------+-----------+----------+--------------+ FV Mid   Full                                                        +---------+---------------+---------+-----------+----------+--------------+ FV DistalFull                                                        +---------+---------------+---------+-----------+----------+--------------+ PFV      Full                                                        +---------+---------------+---------+-----------+----------+--------------+  POP      Full           Yes      Yes                                 +---------+---------------+---------+-----------+----------+--------------+ PTV      Full                                                        +---------+---------------+---------+-----------+----------+--------------+ PERO     Full                                                        +---------+---------------+---------+-----------+----------+--------------+    Summary: RIGHT: - Findings consistent with 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 - A cystic structure is found in  the popliteal fossa.  LEFT: - There is no evidence of deep vein thrombosis in the lower extremity.  - No cystic structure found in the popliteal fossa.  *See table(s) above for measurements and observations.    Preliminary      Time Spent in minutes  30     Laverna Peace M.D on 07/24/2020 at 3:04 PM  To page go to [email or web address] - password [facility]

## 2020-07-25 LAB — CBC
HCT: 29.5 % — ABNORMAL LOW (ref 36.0–46.0)
Hemoglobin: 8.6 g/dL — ABNORMAL LOW (ref 12.0–15.0)
MCH: 27.9 pg (ref 26.0–34.0)
MCHC: 29.2 g/dL — ABNORMAL LOW (ref 30.0–36.0)
MCV: 95.8 fL (ref 80.0–100.0)
Platelets: 327 10*3/uL (ref 150–400)
RBC: 3.08 MIL/uL — ABNORMAL LOW (ref 3.87–5.11)
RDW: 16.6 % — ABNORMAL HIGH (ref 11.5–15.5)
WBC: 11.1 10*3/uL — ABNORMAL HIGH (ref 4.0–10.5)
nRBC: 0 % (ref 0.0–0.2)

## 2020-07-25 LAB — COMPREHENSIVE METABOLIC PANEL
ALT: 48 U/L — ABNORMAL HIGH (ref 0–44)
AST: 38 U/L (ref 15–41)
Albumin: 2.1 g/dL — ABNORMAL LOW (ref 3.5–5.0)
Alkaline Phosphatase: 53 U/L (ref 38–126)
Anion gap: 9 (ref 5–15)
BUN: 9 mg/dL (ref 8–23)
CO2: 25 mmol/L (ref 22–32)
Calcium: 8.9 mg/dL (ref 8.9–10.3)
Chloride: 110 mmol/L (ref 98–111)
Creatinine, Ser: 0.43 mg/dL — ABNORMAL LOW (ref 0.44–1.00)
GFR, Estimated: >60 mL/min (ref >60)
Glucose, Bld: 112 mg/dL — ABNORMAL HIGH (ref 70–99)
Potassium: 3.7 mmol/L (ref 3.5–5.1)
Sodium: 144 mmol/L (ref 135–145)
Total Bilirubin: 0.5 mg/dL (ref 0.3–1.2)
Total Protein: 6.2 g/dL — ABNORMAL LOW (ref 6.5–8.1)

## 2020-07-25 LAB — PROTIME-INR
INR: 1.5 — ABNORMAL HIGH (ref 0.8–1.2)
Prothrombin Time: 17.8 seconds — ABNORMAL HIGH (ref 11.4–15.2)

## 2020-07-25 LAB — HEPARIN LEVEL (UNFRACTIONATED): Heparin Unfractionated: >2.2 IU/mL — ABNORMAL HIGH (ref 0.30–0.70)

## 2020-07-25 LAB — APTT: aPTT: 33 seconds (ref 24–36)

## 2020-07-25 MED ORDER — HEPARIN (PORCINE) 25000 UT/250ML-% IV SOLN
950.0000 [IU]/h | INTRAVENOUS | Status: DC
  Administered 2020-07-25: 17:00:00 850 [IU]/h via INTRAVENOUS
  Administered 2020-07-26: 20:00:00 950 [IU]/h via INTRAVENOUS
  Filled 2020-07-25 (×2): qty 250

## 2020-07-25 NOTE — Progress Notes (Signed)
[facility]   Reason for visit: follow up on hardware exposure in back  Interval History: no complaints today, no fever, no chills.  WBC 11.1. remains off of antibiotics.      Physical Exam: Constitutional:  Vitals:   07/25/20 0443 07/25/20 1232  BP: 135/69 (!) 159/64  Pulse: 79 69  Resp: 14 16  Temp: 99 F (37.2 C) 98.1 F (36.7 C)  SpO2: 94% 98%   she is in nad Respiratory: normal respiratory effort, no creackles, no wheezes Cardiovascular:RRR without any murmur noted   Review of Systems: Constitutional: negative for fever, chills Gastrointestinal: negative for nausea and diarrhea Integument/breast: negative for rash  Lab Results  Component Value Date   WBC 11.1 (H) 07/25/2020   HGB 8.6 (L) 07/25/2020   HCT 29.5 (L) 07/25/2020   MCV 95.8 07/25/2020   PLT 327 07/25/2020    Lab Results  Component Value Date   CREATININE 0.43 (L) 07/25/2020   BUN 9 07/25/2020   NA 144 07/25/2020   K 3.7 07/25/2020   CL 110 07/25/2020   CO2 25 07/25/2020    Lab Results  Component Value Date   ALT 48 (H) 07/25/2020   AST 38 07/25/2020   ALKPHOS 53 07/25/2020     Microbiology: Recent Results (from the past 240 hour(s))  Aerobic Culture (superficial specimen)     Status: None (Preliminary result)   Collection Time: 07/21/20 12:00 PM   Specimen: Wound  Result Value Ref Range Status   Specimen Description   Final    WOUND BACK Performed at [facility], [address], [place], [place] [address]    Special Requests   Final    NONE Performed at [facility], [address], [place], [place] [address]    Gram Stain   Final    ABUNDANT WBC PRESENT,BOTH PMN AND MONONUCLEAR ABUNDANT GRAM NEGATIVE RODS MODERATE GRAM POSITIVE COCCI    Culture   Final    FEW ESCHERICHIA COLI ABUNDANT STREPTOCOCCUS ANGINOSIS SUSCEPTIBILITIES TO FOLLOW Performed at [facility], [address], [place], [place]  [address]    Report Status PENDING  Incomplete   Organism ID, Bacteria ESCHERICHIA COLI  Final      Susceptibility   Escherichia coli - MIC*    AMPICILLIN <=2 SENSITIVE Sensitive     CEFAZOLIN <=4 SENSITIVE Sensitive     CEFEPIME <=0.12 SENSITIVE Sensitive     CEFTAZIDIME <=1 SENSITIVE Sensitive     CEFTRIAXONE <=0.25 SENSITIVE Sensitive     CIPROFLOXACIN <=0.25 SENSITIVE Sensitive     GENTAMICIN <=1 SENSITIVE Sensitive     IMIPENEM <=0.25 SENSITIVE Sensitive     TRIMETH/SULFA <=20 SENSITIVE Sensitive     AMPICILLIN/SULBACTAM <=2 SENSITIVE Sensitive     PIP/TAZO <=4 SENSITIVE Sensitive     * FEW ESCHERICHIA COLI  Blood culture (routine x 2)     Status: None (Preliminary result)   Collection Time: 07/21/20  1:40 PM   Specimen: BLOOD RIGHT WRIST  Result Value Ref Range Status   Specimen Description   Final    BLOOD RIGHT WRIST Performed at [facility], [address], [place], [place] [address]    Special Requests   Final    BOTTLES DRAWN AEROBIC AND ANAEROBIC Blood Culture adequate volume Performed at [facility], [address] [place], [place] [address]    Culture   Final    NO GROWTH 3 DAYS Performed  at [facility], [address], [place], [place] [address]    Report Status PENDING  Incomplete  Blood culture (routine x 2)     Status: None (Preliminary result)   Collection Time: 07/21/20  1:52 PM   Specimen: BLOOD  Result Value Ref Range Status   Specimen Description   Final    BLOOD RIGHT ANTECUBITAL Performed at [facility], [address], [place], [place] [address]    Special Requests   Final    BOTTLES DRAWN AEROBIC AND ANAEROBIC Blood Culture results may not be optimal due to an inadequate volume of blood received in culture bottles Performed at [facility], [address], [place], [place] [address]    Culture   Final    NO GROWTH 3 DAYS Performed at [facility],  [address], [place], [place] [address]    Report Status PENDING  Incomplete  Resp Panel by RT-PCR (Flu A&B, Covid) Nasopharyngeal Swab     Status: None   Collection Time: 07/21/20  1:57 PM   Specimen: Nasopharyngeal Swab; Nasopharyngeal(NP) swabs in vial transport medium  Result Value Ref Range Status   SARS Coronavirus 2 by RT PCR NEGATIVE NEGATIVE Final    Comment: (NOTE) SARS-CoV-2 target nucleic acids are NOT DETECTED.  The SARS-CoV-2 RNA is generally detectable in upper respiratory specimens during the acute phase of infection. The lowest concentration of SARS-CoV-2 viral copies this assay can detect is 138 copies/mL. A negative result does not preclude SARS-Cov-2 infection and should not be used as the sole basis for treatment or other patient management decisions. A negative result may occur with  improper specimen collection/handling, submission of specimen other than nasopharyngeal swab, presence of viral mutation(s) within the areas targeted by this assay, and inadequate number of viral copies(<138 copies/mL). A negative result must be combined with clinical observations, patient history, and epidemiological information. The expected result is Negative.  Fact Sheet for Patients:  BloggerCourse.com  Fact Sheet for Healthcare Providers:  SeriousBroker.it  This test is no t yet approved or cleared by the [place] FDA and  has been authorized for detection and/or diagnosis of SARS-CoV-2 by FDA under an Emergency Use Authorization (EUA). This EUA will remain  in effect (meaning this test can be used) for the duration of the COVID-19 declaration under Section 564(b)(1) of the Act, 21 U.S.C.section 360bbb-3(b)(1), unless the authorization is terminated  or revoked sooner.       Influenza A by PCR NEGATIVE NEGATIVE Final   Influenza B by PCR NEGATIVE NEGATIVE Final    Comment: (NOTE) The Xpert Xpress SARS-CoV-2/FLU/RSV  plus assay is intended as an aid in the diagnosis of influenza from Nasopharyngeal swab specimens and should not be used as a sole basis for treatment. Nasal washings and aspirates are unacceptable for Xpert Xpress SARS-CoV-2/FLU/RSV testing.  Fact Sheet for Patients: BloggerCourse.com  Fact Sheet for Healthcare Providers: SeriousBroker.it  This test is not yet approved or cleared by the [place] FDA and has been authorized for detection and/or diagnosis of SARS-CoV-2 by FDA under an Emergency Use Authorization (EUA). This EUA will remain in effect (meaning this test can be used) for the duration of the COVID-19 declaration under Section 564(b)(1) of the Act, 21 U.S.C. section 360bbb-3(b)(1), unless the authorization is terminated or revoked.  Performed at [facility], [address], [place], [place] [address]     Impression/Plan:  1.  Exposed hardware in back - she  has been seen by Dr. Danielle Dess and plan to transfer to [facility] for hardware removal.

## 2020-07-25 NOTE — Plan of Care (Signed)

## 2020-07-25 NOTE — Plan of Care (Signed)
  Problem: Education: Goal: Knowledge of General Education information will improve Description: Including pain rating scale, medication(s)/side effects and non-pharmacologic comfort measures Outcome: Progressing   Problem: Clinical Measurements: Goal: Will remain free from infection Outcome: Progressing Goal: Respiratory complications will improve Outcome: Progressing   Problem: Activity: Goal: Risk for activity intolerance will decrease Outcome: Progressing   Problem: Elimination: Goal: Will not experience complications related to bowel motility Outcome: Progressing   Problem: Skin Integrity: Goal: Risk for impaired skin integrity will decrease Outcome: Not Progressing

## 2020-07-25 NOTE — Progress Notes (Signed)
ANTICOAGULATION CONSULT NOTE - Initial Consult  Pharmacy Consult for IV heparin (while PTA Apixaban on hold) Indication: recent PE, bilateral DVT   Allergies  Allergen Reactions  . T-Pa [Alteplase] Swelling    Does okay with it if premedicated with benadryl.  . Codeine Itching    Patient Measurements: Height: 5\' 2"  (157.5 cm) Weight: 80.7 kg (177 lb 14.6 oz) IBW/kg (Calculated) : 50.1 Heparin Dosing Weight: 68 kg  Vital Signs: Temp: 98.1 F (36.7 C) (12/14 1232) Temp Source: Oral (12/14 1232) BP: 159/64 (12/14 1232) Pulse Rate: 69 (12/14 1232)  Labs: Recent Labs    07/23/20 0458 07/23/20 1712 07/24/20 0418 07/25/20 0400  HGB 9.3*  --  9.2* 8.6*  HCT 31.8*  --  31.3* 29.5*  PLT 336  --  343 327  CREATININE 0.40* 0.43* 0.45 0.43*    Estimated Creatinine Clearance: 55.2 mL/min (A) (by C-G formula based on SCr of 0.43 mg/dL (L)).   Medical History: Past Medical History:  Diagnosis Date  . Arthritis   . Asthma   . Chronic back pain   . Depression   . History of pulmonary embolus (PE)   . Hx-TIA (transient ischemic attack)   . Hyperlipemia   . Hypertension   . Personal history of DVT (deep vein thrombosis)   . Scoliosis   . Sleep apnea    cpap - settingsi at 3   . Stroke (HCC)   . 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 Neurosurgery plans for surgical debridement and removal of metal hardware. Apixaban held, and pharmacy asked to transition to IV heparin infusion. Last dose of Apixaban 5mg  PO BID on 07/24/20 at 2100. CBC: Hgb 8.6, Pltc WNL.    Goal of Therapy:  Heparin level 0.3-0.5 units/ml  aPTT 66-84 seconds *Target lower end of goal range per Dr. 07/26/20 given history of hematoma on past admission  Monitor platelets by  anticoagulation protocol: Yes   Plan:   Baseline PT/INR, aPTT, and heparin level  Heparin level likely to be falsely elevated from recent DOAC. Therefore, will use aPTT initially for dose titration/monitoring until aPTT and heparin level correlate.   IV heparin infusion at 850 units/hr (was therapeutic at this rate on previous admission)  aPTT 8 hours after initiation of heparin infusion  Daily CBC, heparin level, aPTT  Neurosurgery: please indicate when to stop heparin infusion prior to OR    [id number], PharmD, BCPS Clinical Pharmacist  07/25/2020,3:39 PM

## 2020-07-25 NOTE — Progress Notes (Addendum)
[facility]  PROGRESS NOTE  [name] KKX:[id number] DOB: Sep 11, 1940 DOA: 07/21/2020 PCP: [name], MD Admit date - 07/21/2020   Admitting Physician Laverna Peace, MD  Outpatient Primary MD for the patient is [name], MD  LOS - 4 Brief Narrative  [name] is a 79 y.o. 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  In the ED patient was afebrile, blood pressure range 105/91-1 13/35, tachycardic with heart rate 97-104, WBC 11.6, calcium 6.3, CO2 19, lactic acid 3 and CT abdomen showing possible DVT of right common femoral vein as well as bladder wall thickening and large colonic stool burden, CT lumbar spine showing skin breakdown at region of fusion with visible hardware and no evidence of soft tissue abscess. Patient was started on vancomycin and cefepime and blood cultures were obtained prior to antibiotics. [facility] was called for further management  Last hospitalization from 10/13-10/19 for AKI while at inpatient rehab and prior hospitalization for syncope on 9/24 and found to have saddle PE with cor pulmonale treated with TPA complicated by left upper  extremity hematoma requiring transfusion, right hip cellulitis treated with a vancomycin and E. coli treated with Keflex with IVC filter placed on 9/29. Per chart review after last hospitalization patient was discharged home with hospice.  Per chart review (06/19/2020) has [facility] outpatient palliative services. She is not under hospice care, but per her MOST form is agreeable to IVF and IV antibiotics to treat what is treatable. Also states that patient has had infected thoracic spine decubitus ulcer with exposed hardware that was being treated with Santyl and wet to dry dressings  Hospital course: complicated by exposed metal hardware.  Initially was treated on empiric vancomycin and cefepime due to concern for sepsis (tachycardia, leukocytosis, lactic acidosis of 3, without fever).  ID evaluated and given no overt signs of infection of sacral ulcer recommended discontinuation of antibiotics with close monitoring.  Neurosurgery also evaluated and recommends surgical debridement which family is agreeable to.  Patient currently awaiting transfer to [facility] to allow such.  Subjective  Lying in bed comfortably.  Complains of no back pain.  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  Sepsis physiology remains resolved.  Clinically stable off antibiotics, has slight white count of 11.1 but remains afebrile with no growth on blood cultures. -Pain control IV morphine 1 mg as needed severe pain, continue home oxycodone IR 7.5 mg 4 times daily as needed -Empiric vancomycin, cefepime discontinued on 12/12 given no sepsis physiology per ID recommendations --If becomes febrile or demonstrates evidence of sepsis can resume empiric antibiotics -Monitor blood cultures -Monitor on telemetry --Wound care consulted -Neurosurgery has discussed with family who elects for surgical  debridement and removal of metal hardware, which will require transfer to [facility], will likely happen on 12/15,  n.p.o. at midnight.  In the meantime we will continue wet-to-dry dressings -Stop home eliquis and start IV heparin given plan for surgical procedure  Exposed hardware from surgical decompression and arthrodesis from L1-S1 on 03/31/2006 secondary to symptomatic spondylitic stenosis with idiopathic scoliosis.  Per note on 06/19/20, patient has had decubitus ulcer with exposed hardware being treated at home with wet to dry dressings and santyl. In speaking with family they noted increase odorous drainage which prompted evaluation at wound care center who recommended she be evaluated in ED which led to this admission -Neurosurgery plans for surgical debridement likely 12/15 at [facility], n.p.o. at midnight, awaiting transfer to [facility]  Increasing right leg edema with a history of prior DVTs/PE. Patient has IVC filter and has been adherent with Eliquis per family. CT abdomen imaging concerning for worsening edema that may be related to new clot burden with right common femoral vein.  Venous duplex also shows proximal right common femoral vein DVT of indeterminate age -Status post IVC filter last hospitalization in September of this year -Stop home eliquis and start IV heparin given plan for surgical procedure  Hypocalcemia, resovled Albumin is very low too so may not be quite as low as it seems. Likely in setting of sepsis. Ca now wnl. Mg wnl  Mild elevation in LFTs. Could be correlated with sepsis physiology on arrival. No abdominal pain currently.  Hepatitis panel unremarkable  -Trend CMP  -Hold home statin  Incidental finding of bladder wall thickening. UA unremarkable -continue to monitor output  Moderately large colonic stool burden without evidence of bowel obstruction is noted on CT abdomen. In the setting of opioid regimen movements. Having BMs on bowel regimen -Continue  scheduled stool softeners and MiraLAX and closely monitor  History of submassive bilateral saddle PE and right and left DVT ( 9/20201). Status post IVC filter, adherent to Eliquis at home per son. Stable on room air.  No concern for bleeding currently. CT imaging on admission showed concern for new right-sided DVT of proximal femoral vein -Stop home eliquis and start IV heparin given plan for surgical procedure -Has IVC filter in place  Left upper extremity swelling in setting of hematoma from prior hospitalization secondary to TPA. -Elevate extremity, swelling does not improve may warrant venous duplex of upper extremity as well -Start IV heparin at lower end of therapeutic goal given prior episode of hematoma on last hospitalization  Chronic anemia, normocytic, hemoglobin stable at baseline 8-9. No bleeding episodes. -Monitor CBC while on IV heparin  Hypertension, at goal.  Blood pressures initially held on admission given concern for sepsis physiology.   -Holding home BP meds, can resume if becomes hypertensive -Continue to closely monitor -IV hydralazine as needed  Dementia with behavior disturbances, stable. Alert to self and place and year assistant with baseline per family -Continue BuSpar, Aricept, Namenda delirium precautions  History of stroke ( 02/2020). Had loop recorder placed at that time - no new focal deficits     Family Communication  : Updated son and his wife on 12/14  Code Status :  Limited Code. Ok with CPR but no intubation in discussion with patient's son on day of admission on the phone   Disposition Plan  :  Patient is from home. Anticipated d/c date:  Greater than 3 days. Barriers to d/c or necessity for inpatient status:  Monitoring off IV antibiotics  given no current sepsis physiology as recommended by ID, needs transfer to [facility] for neurosurgical debridement and removal of metal hardware, possible plan 12/15, will make n.p.o. at midnight Consults  :   WOund, ID, neurosurgery  Procedures  : Venous duplex  DVT Prophylaxis  :  Home eliquis MDM: The below labs and imaging reports were reviewed and summarized above.  Medication management as above.  Lab Results  Component Value Date   PLT 327 07/25/2020    Diet :  Diet Order            DIET DYS 2 Room service appropriate? Yes; Fluid consistency: Thin  Diet effective now                  Inpatient Medications Scheduled Meds: . apixaban  5 mg Oral BID  . donepezil  10 mg Oral QHS  . dronabinol  2.5 mg Oral BID AC  . feeding supplement  237 mL Oral TID BM  . linaclotide  145 mcg Oral Daily  . loratadine  10 mg Oral Daily  . memantine  28 mg Oral QHS  . polyethylene glycol  17 g Oral BID  . senna-docusate  1 tablet Oral BID   Continuous Infusions: . busPIRone     PRN Meds:.acetaminophen **OR** acetaminophen, cycloSPORINE, morphine injection, oxyCODONE  Antibiotics  :   Anti-infectives (From admission, onward)   Start     Dose/Rate Route Frequency Ordered Stop   07/23/20 1600  metroNIDAZOLE (FLAGYL) tablet 500 mg  Status:  Discontinued        500 mg Oral Every 8 hours 07/23/20 1433 07/23/20 1613   07/22/20 1830  vancomycin (VANCOREADY) IVPB 750 mg/150 mL  Status:  Discontinued        750 mg 150 mL/hr over 60 Minutes Intravenous Every 24 hours 07/21/20 1829 07/23/20 1613   07/22/20 0200  ceFEPIme (MAXIPIME) 2 g in sodium chloride 0.9 % 100 mL IVPB  Status:  Discontinued        2 g 200 mL/hr over 30 Minutes Intravenous Every 12 hours 07/21/20 1803 07/23/20 1613   07/21/20 1900  vancomycin (VANCOREADY) IVPB 500 mg/100 mL        500 mg 100 mL/hr over 60 Minutes Intravenous  Once 07/21/20 1803 07/21/20 2033   07/21/20 1345  ceFEPIme (MAXIPIME) 2 g in sodium chloride 0.9 % 100 mL IVPB        2 g 200 mL/hr over 30 Minutes Intravenous  Once 07/21/20 1331 07/21/20 1415   07/21/20 1330  vancomycin (VANCOCIN) IVPB 1000 mg/200 mL premix        1,000 mg 200 mL/hr over 60  Minutes Intravenous  Once 07/21/20 1328 07/21/20 1520   07/21/20 1330  ceFEPIme (MAXIPIME) 1 g in sodium chloride 0.9 % 100 mL IVPB  Status:  Discontinued        1 g 200 mL/hr over 30 Minutes Intravenous  Once 07/21/20 1328 07/21/20 1331       Objective   Vitals:   07/24/20 2045 07/25/20 0443 07/25/20 0520 07/25/20 1232  BP: 129/75 135/69  (!) 159/64  Pulse: 77 79  69  Resp: 14 14  16   Temp: 98.3 F (36.8 C) 99 F (37.2 C)  98.1 F (36.7 C)  TempSrc: Oral Oral  Oral  SpO2: 95% 94%  98%  Weight:   80.7 kg   Height:        SpO2: 98 %  Wt Readings from Last 3 Encounters:  07/25/20 80.7 kg  05/25/20 93 kg  05/13/20 93 kg     Intake/Output Summary (Last 24 hours) at 07/25/2020 1235 Last data filed at 07/25/2020 0500 Gross per 24 hour  Intake --  Output 400 ml  Net -400 ml    Physical Exam:  Awake Alert, Oriented self, place,  no distress, follows commands No new F.N deficits,  [place].AT, Normal respiratory effort on room air, CTAB RRR,No Gallops,Rubs or new Murmurs, 1+ pitting edema of right lower extremity from ankle up to thigh Lue edema ( from hand to elbow), chronic per son ( though slightly worse from pictures he shows me) +ve B.Sounds, Abd Soft, No tenderness, No rebound, guarding or rigidity. From pics on admission on 12/10         I have personally reviewed the following:   Data Reviewed:  CBC Recent Labs  Lab 07/21/20 1340 07/22/20 0435 07/23/20 0458 07/24/20 0418 07/25/20 0400  WBC 11.1* 11.6* 11.9* 10.2 11.1*  HGB 12.3 9.6* 9.3* 9.2* 8.6*  HCT 40.5 32.4* 31.8* 31.3* 29.5*  PLT 386 357 336 343 327  MCV 94.4 96.1 96.4 95.4 95.8  MCH 28.7 28.5 28.2 28.0 27.9  MCHC 30.4 29.6* 29.2* 29.4* 29.2*  RDW 16.8* 16.2* 16.2* 16.3* 16.6*  LYMPHSABS 1.3  --   --   --   --   MONOABS 0.5  --   --   --   --   EOSABS 0.1  --   --   --   --   BASOSABS 0.0  --   --   --   --     Chemistries  Recent Labs  Lab 07/21/20 1403 07/22/20 0435  07/23/20 0458 07/23/20 1712 07/24/20 0418 07/25/20 0400  NA 143 144 143 141 144 144  K 4.4 3.7 3.1* 3.4* 3.7 3.7  CL 119* 109 109 110 109 110  CO2 19* 25 23 23 24 25   GLUCOSE 95 93 86 120* 110* 112*  BUN 8 11 8 9 9 9   CREATININE 0.67 0.54 0.40* 0.43* 0.45 0.43*  CALCIUM 6.3* 8.9 8.6* 8.4* 8.8* 8.9  MG  --  2.2 2.0  --  2.1  --   AST 75* 54* 38  --  41 38  ALT 58* 67* 55*  --  54* 48*  ALKPHOS 46 63 56  --  55 53  BILITOT 2.0* 0.5 0.8  --  0.6 0.5   ------------------------------------------------------------------------------------------------------------------ No results for input(s): CHOL, HDL, LDLCALC, TRIG, CHOLHDL, LDLDIRECT in the last 72 hours.  Lab Results  Component Value Date   HGBA1C 5.2 05/06/2020   ------------------------------------------------------------------------------------------------------------------ No results for input(s): TSH, T4TOTAL, T3FREE, THYROIDAB in the last 72 hours.  Invalid input(s): FREET3 ------------------------------------------------------------------------------------------------------------------ No results for input(s): VITAMINB12, FOLATE, FERRITIN, TIBC, IRON, RETICCTPCT in the last 72 hours.  Coagulation profile No results for input(s): INR, PROTIME in the last 168 hours.  No results for input(s): DDIMER in the last 72 hours.  Cardiac Enzymes No results for input(s): CKMB, TROPONINI, MYOGLOBIN in the last 168 hours.  Invalid input(s): CK ------------------------------------------------------------------------------------------------------------------    Component Value Date/Time   BNP 175.9 (H) 05/06/2020 0123    Micro Results Recent Results (from the past 240 hour(s))  Aerobic Culture (superficial specimen)     Status: None (Preliminary result)   Collection Time: 07/21/20 12:00 PM   Specimen: Wound  Result Value Ref Range Status   Specimen Description   Final    WOUND BACK Performed at [facility], [address], [place], [place] [address]    Special Requests   Final    NONE Performed at [facility], [address] [place], [place] [address]    Gram Stain   Final    ABUNDANT WBC PRESENT,BOTH PMN AND MONONUCLEAR ABUNDANT GRAM NEGATIVE RODS MODERATE GRAM POSITIVE COCCI    Culture   Final    FEW ESCHERICHIA COLI ABUNDANT STREPTOCOCCUS ANGINOSIS SUSCEPTIBILITIES TO FOLLOW Performed at [facility], [address], [place], [place] [address]    Report Status PENDING  Incomplete   Organism ID, Bacteria ESCHERICHIA COLI  Final      Susceptibility   Escherichia coli - MIC*    AMPICILLIN <=2 SENSITIVE Sensitive     CEFAZOLIN <=4 SENSITIVE Sensitive     CEFEPIME <=0.12 SENSITIVE Sensitive     CEFTAZIDIME <=1 SENSITIVE Sensitive     CEFTRIAXONE <=0.25 SENSITIVE Sensitive     CIPROFLOXACIN <=0.25 SENSITIVE Sensitive     GENTAMICIN <=1 SENSITIVE Sensitive     IMIPENEM <=0.25 SENSITIVE Sensitive     TRIMETH/SULFA <=20 SENSITIVE Sensitive     AMPICILLIN/SULBACTAM <=2 SENSITIVE Sensitive     PIP/TAZO <=4 SENSITIVE Sensitive     * FEW ESCHERICHIA COLI  Blood culture (routine x 2)     Status: None (Preliminary result)   Collection Time: 07/21/20  1:40 PM   Specimen: BLOOD RIGHT WRIST  Result Value Ref Range Status   Specimen Description   Final    BLOOD RIGHT WRIST Performed at [facility], [address] [place], [place] [address]    Special Requests   Final    BOTTLES DRAWN AEROBIC AND ANAEROBIC Blood Culture adequate volume Performed at [facility], [address] [place], [place] [address]    Culture   Final    NO GROWTH 3 DAYS Performed at [facility], [address] [place], [place] [address]    Report Status PENDING  Incomplete  Blood culture (routine x 2)     Status: None (Preliminary result)   Collection Time: 07/21/20  1:52 PM   Specimen: BLOOD  Result Value Ref Range Status    Specimen Description   Final    BLOOD RIGHT ANTECUBITAL Performed at [facility], [address], [place], [place] [address]    Special Requests   Final    BOTTLES DRAWN AEROBIC AND ANAEROBIC Blood Culture results may not be optimal due to an inadequate volume of blood received in culture bottles Performed at [facility], [address], [place], [place] [address]    Culture   Final    NO GROWTH 3 DAYS Performed at [facility], [address], [place], [place] [address]    Report Status PENDING  Incomplete  Resp Panel by RT-PCR (Flu A&B, Covid) Nasopharyngeal Swab     Status: None   Collection Time: 07/21/20  1:57 PM   Specimen: Nasopharyngeal Swab; Nasopharyngeal(NP) swabs in vial transport medium  Result Value Ref Range Status   SARS Coronavirus 2 by RT PCR NEGATIVE NEGATIVE Final    Comment: (NOTE) SARS-CoV-2 target nucleic acids are NOT DETECTED.  The SARS-CoV-2 RNA is generally detectable in upper respiratory specimens during the acute phase of infection. The lowest concentration of SARS-CoV-2 viral copies this assay can detect is 138 copies/mL. A negative result does not preclude SARS-Cov-2 infection and should not be used as the sole basis for treatment or other patient  management decisions. A negative result may occur with  improper specimen collection/handling, submission of specimen other than nasopharyngeal swab, presence of viral mutation(s) within the areas targeted by this assay, and inadequate number of viral copies(<138 copies/mL). A negative result must be combined with clinical observations, patient history, and epidemiological information. The expected result is Negative.  Fact Sheet for Patients:  BloggerCourse.com  Fact Sheet for Healthcare Providers:  SeriousBroker.it  This test is no t yet approved or cleared by the [place] FDA and  has been authorized  for detection and/or diagnosis of SARS-CoV-2 by FDA under an Emergency Use Authorization (EUA). This EUA will remain  in effect (meaning this test can be used) for the duration of the COVID-19 declaration under Section 564(b)(1) of the Act, 21 U.S.C.section 360bbb-3(b)(1), unless the authorization is terminated  or revoked sooner.       Influenza A by PCR NEGATIVE NEGATIVE Final   Influenza B by PCR NEGATIVE NEGATIVE Final    Comment: (NOTE) The Xpert Xpress SARS-CoV-2/FLU/RSV plus assay is intended as an aid in the diagnosis of influenza from Nasopharyngeal swab specimens and should not be used as a sole basis for treatment. Nasal washings and aspirates are unacceptable for Xpert Xpress SARS-CoV-2/FLU/RSV testing.  Fact Sheet for Patients: BloggerCourse.com  Fact Sheet for Healthcare Providers: SeriousBroker.it  This test is not yet approved or cleared by the [place] FDA and has been authorized for detection and/or diagnosis of SARS-CoV-2 by FDA under an Emergency Use Authorization (EUA). This EUA will remain in effect (meaning this test can be used) for the duration of the COVID-19 declaration under Section 564(b)(1) of the Act, 21 U.S.C. section 360bbb-3(b)(1), unless the authorization is terminated or revoked.  Performed at [facility], [address] [place], [place] [address]     Radiology Reports CT ABDOMEN PELVIS W CONTRAST  Result Date: 07/21/2020 CLINICAL DATA:  Back pain. Prior lumbar surgery with overlying wound. Abdominal pain. Rule out abscess. EXAM: CT ABDOMEN AND PELVIS WITH CONTRAST TECHNIQUE: Multidetector CT imaging of the abdomen and pelvis was performed using the standard protocol following bolus administration of intravenous contrast. CONTRAST:  OMNIPAQUE IOHEXOL 300 MG/ML  SOLN COMPARISON:  05/09/2020 FINDINGS: Lower chest: Mild atelectasis in the lung bases. No pleural  effusion. Coronary atherosclerosis. Hepatobiliary: A few subcentimeter hypodensities in the liver too small to fully characterize. Moderate gallbladder distension is similar to the prior study as is mild extrahepatic biliary dilatation. Pancreas: Borderline to mild pancreatic ductal dilatation. No peripancreatic inflammation. Spleen: Unremarkable. Adrenals/Urinary Tract: Unremarkable adrenal glands. No evidence of renal mass, calculi, or hydronephrosis. Under distended bladder with appearance of circumferential wall thickening. Stomach/Bowel: The stomach is unremarkable. There is a moderately large amount of stool in the colon and rectum, much greater than on the prior study. There is no evidence of bowel obstruction or inflammation. Vascular/Lymphatic: Abdominal aortic atherosclerosis without aneurysm. New infrarenal IVC filter. Heterogeneously diminished enhancement of the right common femoral vein with question of a central filling defect and partially visualized asymmetric right thigh soft tissue edema. Reproductive: Suspected small calcified uterine fibroid. No adnexal mass. Other: No intraperitoneal free fluid, fluid collection, or pneumoperitoneum. Musculoskeletal: Scoliosis and postoperative changes in the lumbar spine with overlying skin breakdown, evaluated in detail on the separate lumbar spine CT. Old right-sided pubic ramus fractures. IMPRESSION: 1. Possible deep venous thrombosis in the right common femoral vein with asymmetric right thigh soft tissue edema. There was DVT more distally in the right leg on a  05/06/2020 ultrasound. 2. Suspected circumferential bladder wall thickening despite underdistention. Correlate for cystitis. 3. No evidence of intra-abdominal abscess. Lumbar spine reported separately. 4. Moderately large colonic stool burden. No evidence of bowel obstruction. 5. Aortic Atherosclerosis (ICD10-I70.0). Electronically Signed   By: Sebastian Ache M.D.   On: 07/21/2020 17:37   CT L-SPINE  NO CHARGE  Result Date: 07/21/2020 CLINICAL DATA:  Previous spinal surgery with poorly healing wound. Metal exposure 2 physical exam. EXAM: CT LUMBAR SPINE WITHOUT CONTRAST TECHNIQUE: Multidetector CT imaging of the lumbar spine was performed without intravenous contrast administration. Multiplanar CT image reconstructions were also generated. COMPARISON:  CT 05/09/2020 FINDINGS: Segmentation: 5 lumbar type vertebral bodies assumed. Alignment: Fixed scoliotic curvature convex to the left. Kyphotic curvature of the spine at T12-L1. Vertebrae: Previous fusion procedure from L1 to the sacrum with pedicle screws and posterior rods. Pedicle screws on the right at L1, L3, L4, L5 and S1. Pedicle screws on the left at L1, L2, L3, L5 and S1. There appears to be solid union throughout the region. No evidence of hardware fracture or discontinuity. Left-sided screw at L5 enters the disc space, but this does not appear to be associated with any unfavorable finding. Left-sided screw at L2 enters the disc space but this does not appear to be associated with any unfavorable finding. Paraspinal and other soft tissues: Posterior soft tissue skin breakdown posterior to the region of fusion. Based on the clinical history of hardware being visible, I assume this is related to the right bracket at L1. The left bracket at L1 is just beneath the skin but not apparently associated with an overlying skin defect. No evidence of frank soft tissue abscess. Disc levels: As noted above, there appears to be solid union from L1 to the sacrum. Sufficient patency of the canal and foramina. IMPRESSION: Posterior soft tissue skin breakdown posterior to the region of fusion. Based on the clinical history of hardware being visible, I assume this is related to the right bracket at L1. The left bracket at L1 is just beneath the skin but not apparently associated with an overlying skin defect. No evidence of frank soft tissue abscess. Electronically  Signed   By: Paulina Fusi M.D.   On: 07/21/2020 16:52   VAS [place] LOWER EXTREMITY VENOUS (DVT)  Result Date: 07/24/2020  Lower Venous DVT Study Indications: History of DVT, worsening RT leg swelling, F/U CT abdomen imaging findings.  Anticoagulation: Xarelto, IVC filter placed. Limitations: Poor ultrasound/tissue interface. Comparison Study: 05-07-2020 Prior bilateral lower extremity venous study                   available for comparison. Performing Technologist: Jean Rosenthal RDMS  Examination Guidelines: A complete evaluation includes B-mode imaging, spectral Doppler, color Doppler, and power Doppler as needed of all accessible portions of each vessel. Bilateral testing is considered an integral part of a complete examination. Limited examinations for reoccurring indications may be performed as noted. The reflux portion of the exam is performed with the patient in reverse [place].  +---------+---------------+---------+-----------+----------+-------------------+ RIGHT    CompressibilityPhasicitySpontaneityPropertiesThrombus Aging      +---------+---------------+---------+-----------+----------+-------------------+ CFV      Partial        Yes      Yes                  Age Indeterminate   +---------+---------------+---------+-----------+----------+-------------------+ SFJ      Full           Yes  Yes                                      +---------+---------------+---------+-----------+----------+-------------------+ FV Prox  None           No       No                   Age Indeterminate   +---------+---------------+---------+-----------+----------+-------------------+ FV Mid   Full           Yes      Yes                                      +---------+---------------+---------+-----------+----------+-------------------+ FV DistalFull           Yes      Yes                                       +---------+---------------+---------+-----------+----------+-------------------+ PFV      Partial        Yes      Yes                  Age Indeterminate   +---------+---------------+---------+-----------+----------+-------------------+ POP      Full           Yes      Yes                                      +---------+---------------+---------+-----------+----------+-------------------+ PTV                                                   Not well visualized +---------+---------------+---------+-----------+----------+-------------------+ PERO                                                  Not well visualized +---------+---------------+---------+-----------+----------+-------------------+   +---------+---------------+---------+-----------+----------+--------------+ LEFT     CompressibilityPhasicitySpontaneityPropertiesThrombus Aging +---------+---------------+---------+-----------+----------+--------------+ CFV      Full           Yes      Yes                                 +---------+---------------+---------+-----------+----------+--------------+ SFJ      Full                                                        +---------+---------------+---------+-----------+----------+--------------+ FV Prox  Full                                                        +---------+---------------+---------+-----------+----------+--------------+  FV Mid   Full                                                        +---------+---------------+---------+-----------+----------+--------------+ FV DistalFull                                                        +---------+---------------+---------+-----------+----------+--------------+ PFV      Full                                                        +---------+---------------+---------+-----------+----------+--------------+ POP      Full           Yes      Yes                                  +---------+---------------+---------+-----------+----------+--------------+ PTV      Full                                                        +---------+---------------+---------+-----------+----------+--------------+ PERO     Full                                                        +---------+---------------+---------+-----------+----------+--------------+    Summary: RIGHT: - Findings consistent with 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 - A cystic structure is found in the popliteal fossa.  LEFT: - There is no evidence of deep vein thrombosis in the lower extremity.  - No cystic structure found in the popliteal fossa.  *See table(s) above for measurements and observations. Electronically signed by Fabienne Bruns MD on 07/24/2020 at 7:45:34 PM.    Final      Time Spent in minutes  30     Laverna Peace M.D on 07/25/2020 at 12:35 PM  To page go to [email or web address] - password [email or web address]

## 2020-07-26 LAB — COMPREHENSIVE METABOLIC PANEL
ALT: 44 U/L (ref 0–44)
AST: 37 U/L (ref 15–41)
Albumin: 2.1 g/dL — ABNORMAL LOW (ref 3.5–5.0)
Alkaline Phosphatase: 60 U/L (ref 38–126)
Anion gap: 8 (ref 5–15)
BUN: 9 mg/dL (ref 8–23)
CO2: 26 mmol/L (ref 22–32)
Calcium: 8.7 mg/dL — ABNORMAL LOW (ref 8.9–10.3)
Chloride: 108 mmol/L (ref 98–111)
Creatinine, Ser: 0.41 mg/dL — ABNORMAL LOW (ref 0.44–1.00)
GFR, Estimated: >60 mL/min (ref >60)
Glucose, Bld: 115 mg/dL — ABNORMAL HIGH (ref 70–99)
Potassium: 3.2 mmol/L — ABNORMAL LOW (ref 3.5–5.1)
Sodium: 142 mmol/L (ref 135–145)
Total Bilirubin: 0.5 mg/dL (ref 0.3–1.2)
Total Protein: 6.2 g/dL — ABNORMAL LOW (ref 6.5–8.1)

## 2020-07-26 LAB — AEROBIC CULTURE W GRAM STAIN (SUPERFICIAL SPECIMEN)

## 2020-07-26 LAB — CULTURE, BLOOD (ROUTINE X 2)
Culture: NO GROWTH
Culture: NO GROWTH
Special Requests: ADEQUATE

## 2020-07-26 LAB — HEPARIN LEVEL (UNFRACTIONATED): Heparin Unfractionated: >2.2 IU/mL — ABNORMAL HIGH (ref 0.30–0.70)

## 2020-07-26 LAB — CBC
HCT: 30.7 % — ABNORMAL LOW (ref 36.0–46.0)
Hemoglobin: 9 g/dL — ABNORMAL LOW (ref 12.0–15.0)
MCH: 28.1 pg (ref 26.0–34.0)
MCHC: 29.3 g/dL — ABNORMAL LOW (ref 30.0–36.0)
MCV: 95.9 fL (ref 80.0–100.0)
Platelets: 332 10*3/uL (ref 150–400)
RBC: 3.2 MIL/uL — ABNORMAL LOW (ref 3.87–5.11)
RDW: 16.5 % — ABNORMAL HIGH (ref 11.5–15.5)
WBC: 11.4 10*3/uL — ABNORMAL HIGH (ref 4.0–10.5)
nRBC: 0.2 % (ref 0.0–0.2)

## 2020-07-26 LAB — APTT
aPTT: 53 seconds — ABNORMAL HIGH (ref 24–36)
aPTT: 72 seconds — ABNORMAL HIGH (ref 24–36)
aPTT: 79 seconds — ABNORMAL HIGH (ref 24–36)

## 2020-07-26 NOTE — Progress Notes (Signed)
Pharmacy - IV heparin  Assessment:    Please see note from Dorna Leitz, PharmD earlier today for full details.  Briefly, 79 y.o. female on IV heparin for recent PE & bilateral DVT   Most recent aPTT therapeutic at 79 sec on 950 units/hr  No reported bleeding or infusion issues per RN  Plan:   Continue IV heparin at 950 units/hr  Daily CBC, heparin level and aPTT  Bernadene Person, PharmD, BCPS [phone] 07/26/2020, 5:03 PM

## 2020-07-26 NOTE — Progress Notes (Signed)
[facility]   Reason for visit: follow up on exposed hardware in back wound  Interval HistoryL: no complaints today, eating.  Afebrile and WBC stable.   Plan to transfer to [facility] for surgery.     Physical Exam: Constitutional:  Vitals:   07/26/20 0454 07/26/20 1250  BP: (!) 148/63 137/72  Pulse: 70 75  Resp: 14 16  Temp: 99.1 F (37.3 C) 99.1 F (37.3 C)  SpO2: 97% 100%  She is in NAD Respiratory: Normal respiratory effort Cardiovascular: RRR   Review of Systems: Constitutional: no fever Gastrointestinal: no diarrhea or nausea Integument/breast: no rash  Lab Results  Component Value Date   WBC 11.4 (H) 07/26/2020   HGB 9.0 (L) 07/26/2020   HCT 30.7 (L) 07/26/2020   MCV 95.9 07/26/2020   PLT 332 07/26/2020    Lab Results  Component Value Date   CREATININE 0.41 (L) 07/26/2020   BUN 9 07/26/2020   NA 142 07/26/2020   K 3.2 (L) 07/26/2020   CL 108 07/26/2020   CO2 26 07/26/2020    Lab Results  Component Value Date   ALT 44 07/26/2020   AST 37 07/26/2020   ALKPHOS 60 07/26/2020     Microbiology: Recent Results (from the past 240 hour(s))  Aerobic Culture (superficial specimen)     Status: None   Collection Time: 07/21/20 12:00 PM   Specimen: Wound  Result Value Ref Range Status   Specimen Description   Final    WOUND BACK Performed at [facility], [address], [place], [place] [address]    Special Requests   Final    NONE Performed at [name], [address] [place], [place] [address]    Gram Stain   Final    ABUNDANT WBC PRESENT,BOTH PMN AND MONONUCLEAR ABUNDANT GRAM NEGATIVE RODS MODERATE GRAM POSITIVE COCCI Performed at [facility], [address], [place], [place] [address]    Culture   Final    FEW ESCHERICHIA COLI ABUNDANT STREPTOCOCCUS ANGINOSIS    Report Status 07/26/2020 FINAL  Final   Organism ID, Bacteria ESCHERICHIA COLI  Final   Organism ID,  Bacteria STREPTOCOCCUS ANGINOSIS  Final      Susceptibility   Escherichia coli - MIC*    AMPICILLIN <=2 SENSITIVE Sensitive     CEFAZOLIN <=4 SENSITIVE Sensitive     CEFEPIME <=0.12 SENSITIVE Sensitive     CEFTAZIDIME <=1 SENSITIVE Sensitive     CEFTRIAXONE <=0.25 SENSITIVE Sensitive     CIPROFLOXACIN <=0.25 SENSITIVE Sensitive     GENTAMICIN <=1 SENSITIVE Sensitive     IMIPENEM <=0.25 SENSITIVE Sensitive     TRIMETH/SULFA <=20 SENSITIVE Sensitive     AMPICILLIN/SULBACTAM <=2 SENSITIVE Sensitive     PIP/TAZO <=4 SENSITIVE Sensitive     * FEW ESCHERICHIA COLI   Streptococcus anginosis - MIC*    PENICILLIN <=0.06 SENSITIVE Sensitive     CEFTRIAXONE 0.25 SENSITIVE Sensitive     ERYTHROMYCIN <=0.12 SENSITIVE Sensitive     LEVOFLOXACIN 0.5 SENSITIVE Sensitive     VANCOMYCIN 0.5 SENSITIVE Sensitive     * ABUNDANT STREPTOCOCCUS ANGINOSIS  Blood culture (routine x 2)     Status: None   Collection Time: 07/21/20  1:40 PM   Specimen: BLOOD RIGHT WRIST  Result Value Ref Range Status   Specimen Description   Final    BLOOD RIGHT WRIST Performed at [facility], [address], [place], [place] [address]  Special Requests   Final    BOTTLES DRAWN AEROBIC AND ANAEROBIC Blood Culture adequate volume Performed at [facility], [address], [place], [place] [address]    Culture   Final    NO GROWTH 5 DAYS Performed at [facility], [address] [place], [place] [address]    Report Status 07/26/2020 FINAL  Final  Blood culture (routine x 2)     Status: None   Collection Time: 07/21/20  1:52 PM   Specimen: BLOOD  Result Value Ref Range Status   Specimen Description   Final    BLOOD RIGHT ANTECUBITAL Performed at [facility], [address], [place], [place] [address]    Special Requests   Final    BOTTLES DRAWN AEROBIC AND ANAEROBIC Blood Culture results may not be optimal due to an inadequate volume of blood  received in culture bottles Performed at [facility], [address] [place], [place] [address]    Culture   Final    NO GROWTH 5 DAYS Performed at [facility], [address], [place], [place] [address]    Report Status 07/26/2020 FINAL  Final  Resp Panel by RT-PCR (Flu A&B, Covid) Nasopharyngeal Swab     Status: None   Collection Time: 07/21/20  1:57 PM   Specimen: Nasopharyngeal Swab; Nasopharyngeal(NP) swabs in vial transport medium  Result Value Ref Range Status   SARS Coronavirus 2 by RT PCR NEGATIVE NEGATIVE Final    Comment: (NOTE) SARS-CoV-2 target nucleic acids are NOT DETECTED.  The SARS-CoV-2 RNA is generally detectable in upper respiratory specimens during the acute phase of infection. The lowest concentration of SARS-CoV-2 viral copies this assay can detect is 138 copies/mL. A negative result does not preclude SARS-Cov-2 infection and should not be used as the sole basis for treatment or other patient management decisions. A negative result may occur with  improper specimen collection/handling, submission of specimen other than nasopharyngeal swab, presence of viral mutation(s) within the areas targeted by this assay, and inadequate number of viral copies(<138 copies/mL). A negative result must be combined with clinical observations, patient history, and epidemiological information. The expected result is Negative.  Fact Sheet for Patients:  BloggerCourse.com  Fact Sheet for Healthcare Providers:  SeriousBroker.it  This test is no t yet approved or cleared by the [place] FDA and  has been authorized for detection and/or diagnosis of SARS-CoV-2 by FDA under an Emergency Use Authorization (EUA). This EUA will remain  in effect (meaning this test can be used) for the duration of the COVID-19 declaration under Section 564(b)(1) of the Act, 21 U.S.C.section 360bbb-3(b)(1), unless the  authorization is terminated  or revoked sooner.       Influenza A by PCR NEGATIVE NEGATIVE Final   Influenza B by PCR NEGATIVE NEGATIVE Final    Comment: (NOTE) The Xpert Xpress SARS-CoV-2/FLU/RSV plus assay is intended as an aid in the diagnosis of influenza from Nasopharyngeal swab specimens and should not be used as a sole basis for treatment. Nasal washings and aspirates are unacceptable for Xpert Xpress SARS-CoV-2/FLU/RSV testing.  Fact Sheet for Patients: BloggerCourse.com  Fact Sheet for Healthcare Providers: SeriousBroker.it  This test is not yet approved or cleared by the [place] FDA and has been authorized for detection and/or diagnosis of SARS-CoV-2 by FDA under an Emergency Use Authorization (EUA). This EUA will remain in effect (meaning this test can be used) for the duration of the COVID-19 declaration  under Section 564(b)(1) of the Act, 21 U.S.C. section 360bbb-3(b)(1), unless the authorization is terminated or revoked.  Performed at [facility], [address] [place], [place] [address]     Impression/Plan:  1.  Exposed hardware in back wound - plan for surgical debridement.  ? If infection involved though certainly with exposed bone likely.  Will monitor after surgery.  2.  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  Will consider antibiotics after surgery and monitor culture from the OR.

## 2020-07-26 NOTE — Progress Notes (Signed)
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 Patient was admitted with sepsis secondary to unstageable decubitus ulcer of distal thoracic spine with exposed metal hardware.  In the ED patient was mildly tachycardic with elevated lactate and WBC at 11.6.  CT scan of the abdomen showed possible DVT of the right common femoral vein with large colonic stool burden.  CT lumbar spine showed skin breakdown at the fusion of the visible hardware but no evidence of soft tissue abscess.  Patient was started on vancomycin and cefepime and blood cultures were obtained.  She was then admitted to the hospital for further evaluation and treatment. Per chart review after last hospitalization patient was discharged home with hospice.  Patient's hospital course omplicated by exposed metal hardware.  Initially was treated on empiric vancomycin and cefepime due to concern for sepsis (tachycardia, leukocytosis, lactic acidosis of 3, without fever).  ID evaluated and given no overt signs of infection of sacral ulcer recommended discontinuation of antibiotics with close  monitoring.  Neurosurgery also evaluated and recommends surgical debridement which family is agreeable to.   Assessment/Plan:  Active Problems:   Right leg swelling   Depression   History of DVT (deep vein thrombosis)   Dementia (HCC)   Benign essential HTN   Pulmonary embolism (HCC)   Transaminitis   Severe sepsis (HCC)   Hypocalcemia   Osteomyelitis (HCC)   Hardware complicating wound infection (HCC)   Back pain   Pressure injury, unstageable (HCC)  Severesepsis secondaryUnstageable ulcer of thedistal thoracic/early lumbarspine( present on admission) with exposed hardware consistent with osteomyelitis.    Sepsis physiology has improved.  Currently off antibiotic as per ID.  WBC at 11.4.  Blood culture with no growth in 4 days.  Continue pain control.  Vancomycin and cefepime was discontinued on 12/12 as per ID recommendation. Neurosurgery on board who has discussed with family regarding surgical debridement and removal of metal hardware.  Eliquis on hold.  IV heparin in the interim.  Surgical plan as per neurosurgery.  Exposed hardware from surgical decompression and arthrodesis from L1-S1 on 03/31/2006 secondary to symptomatic spondylitic stenosis with idiopathic scoliosis.  Per note on 06/19/20, patient has had decubitus ulcer with exposed hardware being treated at home with wet to dry dressings and santyl.  Antley noted increase odorous drainage which prompted evaluation at wound care center who recommended she be evaluated in ED which led to this admission.  Neurosurgery on board for possible debridement.  History of submassivebilateral saddlePEand right and left DVT ( 9/20201).Patient has IVC filter and has been adherent with Eliquis per family. CT abdomen imaging concerning for worsening edema that may be related to new clot burden with right common femoral vein.  Venous duplex also shows proximal right common femoral vein DVT of indeterminate  age. Currently on IV heparin  drip.  Elevated LFTs. Likely secondary to sepsis, improved.  .Incidental finding of bladder wall thickening. UA unremarkable  Constipation.  continue scheduled stool softeners and MiraLAX   Left upper extremity swelling in setting of hematoma from prior hospitalization secondary to TPA. Continue extremity elevation.  On IV heparin.  Monitor.  Chronic anemia, normocytic, hemoglobin stable at baseline 8-9.  Hemoglobin of 9.0 today.  Monitor closely while on heparin.  Hypertension, on IV hydralazine. On amlodipine, Lasix and lisinopril at home.  Will resume amlodipine due to borderline elevated blood pressure.  Dementia with behavior disturbances, stable. 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 -Continue BuSpar, Aricept, Namenda, delirium precautions  History of stroke ( 02/2020). Had loop recorder placed at that time. no new focal deficits  Pressure Injury 07/22/20 Sacrum Mid Stage 1 -  Intact skin with non-blanchable redness of a localized area usually over a bony prominence. old, closed pressure injury coccyx. (Active)  07/22/20 1535  Location: Sacrum  Location Orientation: Mid  Staging: Stage 1 -  Intact skin with non-blanchable redness of a localized area usually over a bony prominence.  Wound Description (Comments): old, closed pressure injury coccyx.  Present on Admission: Yes       DVT prophylaxis: Heparin drip  Code Status: Partial code.  Okay with CPR but no intubation  Family Communication: None today  Status is: Inpatient  Remains inpatient appropriate because:Unsafe d/c plan, IV treatments appropriate due to intensity of illness or inability to take PO, Inpatient level of care appropriate due to severity of illness and Possible need for surgical debridement   Dispo: The patient is from: Home              Anticipated d/c is to: Likely to skilled nursing facility.              Anticipated d/c date is: > 3 days               Patient currently is not medically stable to d/c.   Consultants:  Neurosurgery  Infectious disease  Wound care  Procedures:  Venous duplex  Antibiotics:  . None currently  Anti-infectives (From admission, onward)   Start     Dose/Rate Route Frequency Ordered Stop   07/23/20 1600  metroNIDAZOLE (FLAGYL) tablet 500 mg  Status:  Discontinued        500 mg Oral Every 8 hours 07/23/20 1433 07/23/20 1613   07/22/20 1830  vancomycin (VANCOREADY) IVPB 750 mg/150 mL  Status:  Discontinued        750 mg 150 mL/hr over 60 Minutes Intravenous Every 24 hours 07/21/20 1829 07/23/20 1613   07/22/20 0200  ceFEPIme (MAXIPIME) 2 g in sodium chloride 0.9 % 100 mL IVPB  Status:  Discontinued        2 g 200 mL/hr over 30 Minutes Intravenous Every 12 hours 07/21/20 1803 07/23/20 1613   07/21/20 1900  vancomycin (VANCOREADY) IVPB 500 mg/100 mL        500 mg 100 mL/hr over 60 Minutes Intravenous  Once 07/21/20 1803 07/21/20 2033   07/21/20 1345  ceFEPIme (MAXIPIME) 2 g in sodium chloride 0.9 % 100 mL IVPB        2 g 200 mL/hr over 30 Minutes Intravenous  Once 07/21/20 1331 07/21/20 1415   07/21/20 1330  vancomycin (VANCOCIN) IVPB 1000 mg/200 mL premix        1,000 mg 200 mL/hr over 60 Minutes  Intravenous  Once 07/21/20 1328 07/21/20 1520   07/21/20 1330  ceFEPIme (MAXIPIME) 1 g in sodium chloride 0.9 % 100 mL IVPB  Status:  Discontinued        1 g 200 mL/hr over 30 Minutes Intravenous  Once 07/21/20 1328 07/21/20 1331     Subjective: Today, patient was seen and examined at bedside.  Patient complains of mild back pain.  Objective: Vitals:   07/25/20 2117 07/26/20 0454  BP: (!) 151/79 (!) 148/63  Pulse: 84 70  Resp: 14 14  Temp: 98.6 F (37 C) 99.1 F (37.3 C)  SpO2: 96% 97%    Intake/Output Summary (Last 24 hours) at 07/26/2020 9147 Last data filed at 07/26/2020 0900 Gross per 24 hour  Intake 340.96 ml  Output 1000 ml  Net -659.04 ml   Filed Weights   07/24/20 0500 07/25/20  0520 07/26/20 0500  Weight: 80 kg 80.7 kg 80.3 kg   Body mass index is 32.38 kg/m.   Physical Exam:  GENERAL: Patient is alert awake and communicative, answering questions, Not in obvious distress.  Obese HENT: No scleral pallor or icterus. Pupils equally reactive to light. Oral mucosa is moist NECK: is supple, no gross swelling noted. CHEST: Clear to auscultation. No crackles or wheezes.  Diminished breath sounds bilaterally. CVS: S1 and S2 heard, no murmur. Regular rate and rhythm.  ABDOMEN: Soft, non-tender, bowel sounds are present. EXTREMITIES: Pitting edema bilateral lower extremities, left upper extremity edema CNS: Cranial nerves are intact. No focal motor deficits. SKIN: Decubitus ulceration present on admission.    Data Review: I have personally reviewed the following laboratory data and studies,  CBC: Recent Labs  Lab 07/21/20 1340 07/22/20 0435 07/23/20 0458 07/24/20 0418 07/25/20 0400 07/26/20 0103  WBC 11.1* 11.6* 11.9* 10.2 11.1* 11.4*  NEUTROABS 9.0*  --   --   --   --   --   HGB 12.3 9.6* 9.3* 9.2* 8.6* 9.0*  HCT 40.5 32.4* 31.8* 31.3* 29.5* 30.7*  MCV 94.4 96.1 96.4 95.4 95.8 95.9  PLT 386 357 336 343 327 332   Basic Metabolic Panel: Recent Labs  Lab 07/22/20 0435 07/23/20 0458 07/23/20 1712 07/24/20 0418 07/25/20 0400 07/26/20 0103  NA 144 143 141 144 144 142  K 3.7 3.1* 3.4* 3.7 3.7 3.2*  CL 109 109 110 109 110 108  CO2 GLUCOSE 93 86 120* 110* 112* 115*  BUN CREATININE 0.54 0.40* 0.43* 0.45 0.43* 0.41*  CALCIUM 8.9 8.6* 8.4* 8.8* 8.9 8.7*  MG 2.2 2.0  --  2.1  --   --    Liver Function Tests: Recent Labs  Lab 07/22/20 0435 07/23/20 0458 07/24/20 0418 07/25/20 0400 07/26/20 0103  AST 54* 38 41 38 37  ALT 67* 55* 54* 48* 44  ALKPHOS 63 56 55 53 60  BILITOT 0.5 0.8 0.6 0.5 0.5  PROT 6.9 6.3* 6.2* 6.2* 6.2*  ALBUMIN 2.2* 2.0* 2.0* 2.1* 2.1*   No results for input(s): LIPASE, AMYLASE in the last  168 hours. No results for input(s): AMMONIA in the last 168 hours. Cardiac Enzymes: No results for input(s): CKTOTAL, CKMB, CKMBINDEX, TROPONINI in the last 168 hours. BNP (last 3 results) Recent Labs    09/16/19 1410 05/06/20 0123  BNP 91.2 175.9*    ProBNP (last 3 results) No results for input(s): PROBNP in the last 8760 hours.  CBG: No results for input(s): GLUCAP in the  last 168 hours. Recent Results (from the past 240 hour(s))  Aerobic Culture (superficial specimen)     Status: None (Preliminary result)   Collection Time: 07/21/20 12:00 PM   Specimen: Wound  Result Value Ref Range Status   Specimen Description   Final    WOUND BACK Performed at [facility], [address] [place], [place] [address]    Special Requests   Final    NONE Performed at [facility], [address] [place], [place] [address]    Gram Stain   Final    ABUNDANT WBC PRESENT,BOTH PMN AND MONONUCLEAR ABUNDANT GRAM NEGATIVE RODS MODERATE GRAM POSITIVE COCCI    Culture   Final    FEW ESCHERICHIA COLI ABUNDANT STREPTOCOCCUS ANGINOSIS SUSCEPTIBILITIES TO FOLLOW Performed at [facility], [address], [place], [place] [address]    Report Status PENDING  Incomplete   Organism ID, Bacteria ESCHERICHIA COLI  Final      Susceptibility   Escherichia coli - MIC*    AMPICILLIN <=2 SENSITIVE Sensitive     CEFAZOLIN <=4 SENSITIVE Sensitive     CEFEPIME <=0.12 SENSITIVE Sensitive     CEFTAZIDIME <=1 SENSITIVE Sensitive     CEFTRIAXONE <=0.25 SENSITIVE Sensitive     CIPROFLOXACIN <=0.25 SENSITIVE Sensitive     GENTAMICIN <=1 SENSITIVE Sensitive     IMIPENEM <=0.25 SENSITIVE Sensitive     TRIMETH/SULFA <=20 SENSITIVE Sensitive     AMPICILLIN/SULBACTAM <=2 SENSITIVE Sensitive     PIP/TAZO <=4 SENSITIVE Sensitive     * FEW ESCHERICHIA COLI  Blood culture (routine x 2)     Status: None (Preliminary result)   Collection Time: 07/21/20  1:40 PM    Specimen: BLOOD RIGHT WRIST  Result Value Ref Range Status   Specimen Description   Final    BLOOD RIGHT WRIST Performed at [facility], [address], [place], [place] [address]    Special Requests   Final    BOTTLES DRAWN AEROBIC AND ANAEROBIC Blood Culture adequate volume Performed at [facility], [address], [place], [place] [address]    Culture   Final    NO GROWTH 4 DAYS Performed at [facility], [address], [place], [place] [address]    Report Status PENDING  Incomplete  Blood culture (routine x 2)     Status: None (Preliminary result)   Collection Time: 07/21/20  1:52 PM   Specimen: BLOOD  Result Value Ref Range Status   Specimen Description   Final    BLOOD RIGHT ANTECUBITAL Performed at [facility], [address] [place], [place] [address]    Special Requests   Final    BOTTLES DRAWN AEROBIC AND ANAEROBIC Blood Culture results may not be optimal due to an inadequate volume of blood received in culture bottles Performed at [facility], [address] [place], [place] [address]    Culture   Final    NO GROWTH 4 DAYS Performed at [facility], [address], [place], [place] [address]    Report Status PENDING  Incomplete  Resp Panel by RT-PCR (Flu A&B, Covid) Nasopharyngeal Swab     Status: None   Collection Time: 07/21/20  1:57 PM   Specimen: Nasopharyngeal Swab; Nasopharyngeal(NP) swabs in vial transport medium  Result Value Ref Range Status   SARS Coronavirus 2 by RT PCR NEGATIVE NEGATIVE Final    Comment: (NOTE) SARS-CoV-2 target nucleic  acids are NOT DETECTED.  The SARS-CoV-2 RNA is generally detectable in upper respiratory specimens during the acute phase of infection. The lowest concentration of SARS-CoV-2 viral copies this assay can detect is 138 copies/mL. A negative result does not preclude SARS-Cov-2 infection and should not be used as the sole basis  for treatment or other patient management decisions. A negative result may occur with  improper specimen collection/handling, submission of specimen other than nasopharyngeal swab, presence of viral mutation(s) within the areas targeted by this assay, and inadequate number of viral copies(<138 copies/mL). A negative result must be combined with clinical observations, patient history, and epidemiological information. The expected result is Negative.  Fact Sheet for Patients:  BloggerCourse.com  Fact Sheet for Healthcare Providers:  SeriousBroker.it  This test is no t yet approved or cleared by the [place] FDA and  has been authorized for detection and/or diagnosis of SARS-CoV-2 by FDA under an Emergency Use Authorization (EUA). This EUA will remain  in effect (meaning this test can be used) for the duration of the COVID-19 declaration under Section 564(b)(1) of the Act, 21 U.S.C.section 360bbb-3(b)(1), unless the authorization is terminated  or revoked sooner.       Influenza A by PCR NEGATIVE NEGATIVE Final   Influenza B by PCR NEGATIVE NEGATIVE Final    Comment: (NOTE) The Xpert Xpress SARS-CoV-2/FLU/RSV plus assay is intended as an aid in the diagnosis of influenza from Nasopharyngeal swab specimens and should not be used as a sole basis for treatment. Nasal washings and aspirates are unacceptable for Xpert Xpress SARS-CoV-2/FLU/RSV testing.  Fact Sheet for Patients: BloggerCourse.com  Fact Sheet for Healthcare Providers: SeriousBroker.it  This test is not yet approved or cleared by the [place] FDA and has been authorized for detection and/or diagnosis of SARS-CoV-2 by FDA under an Emergency Use Authorization (EUA). This EUA will remain in effect (meaning this test can be used) for the duration of the COVID-19 declaration under Section 564(b)(1) of the Act, 21  U.S.C. section 360bbb-3(b)(1), unless the authorization is terminated or revoked.  Performed at [facility], [address], [place], [place] [address]      Studies: No results found.    Joycelyn Das, MD  [facility] 07/26/2020  If 7PM-7AM, please contact night-coverage

## 2020-07-26 NOTE — Progress Notes (Signed)
ANTICOAGULATION CONSULT NOTE - Initial Consult  Pharmacy Consult for IV heparin (while PTA Apixaban on hold) Indication: recent PE, bilateral DVT   Allergies  Allergen Reactions  . T-Pa [Alteplase] Swelling    Does okay with it if premedicated with benadryl.  . Codeine Itching    Patient Measurements: Height: 5\' 2"  (157.5 cm) Weight: 80.3 kg (177 lb 0.5 oz) IBW/kg (Calculated) : 50.1 Heparin Dosing Weight: 68 kg  Vital Signs: Temp: 99.1 F (37.3 C) (12/15 0454) Temp Source: Oral (12/15 0454) BP: 148/63 (12/15 0454) Pulse Rate: 70 (12/15 0454)  Labs: Recent Labs    07/24/20 0418 07/25/20 0400 07/25/20 1549 07/26/20 0103  HGB 9.2* 8.6*  --  9.0*  HCT 31.3* 29.5*  --  30.7*  PLT 343 327  --  332  APTT  --   --  33 53*  LABPROT  --   --  17.8*  --   INR  --   --  1.5*  --   HEPARINUNFRC  --   --  >2.20* >2.20*  CREATININE 0.45 0.43*  --  0.41*    Estimated Creatinine Clearance: 55.1 mL/min (A) (by C-G formula based on SCr of 0.41 mg/dL (L)).   Medical History: Past Medical History:  Diagnosis Date  . Arthritis   . Asthma   . Chronic back pain   . Depression   . History of pulmonary embolus (PE)   . Hx-TIA (transient ischemic attack)   . Hyperlipemia   . Hypertension   . Personal history of DVT (deep vein thrombosis)   . Scoliosis   . Sleep apnea    cpap - settingsi at 3   . Stroke (HCC)   . 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 Neurosurgery plans for surgical debridement and removal of metal hardware. Apixaban held, and pharmacy asked to transition to IV heparin infusion.  - Last dose of Apixaban 5mg  PO BID on 07/24/20 at 2100   Today, 07/26/2020: - aPTT is therapeutic at 72 secs after rate increased to 950  units/hr - cbc stable - no active bleeding documented  Goal of Therapy:  Heparin level 0.3-0.5 units/ml  aPTT 66-84 seconds *Target lower end of goal range per Dr. 2101 given history of hematoma on past admission  Monitor platelets by anticoagulation protocol: Yes   Plan:  - continue heparin drip at 950 units/hr - check aPTT at 5pm to ensure level is still therapeutic before changing to daily monitoring - Heparin level likely to be falsely elevated from recent DOAC. Therefore, will use aPTT initially for dose titration/monitoring until aPTT and heparin levels correlate.  - Daily CBC, heparin level, aPTT - Neurosurgery: please indicate when to stop heparin infusion prior to OR   07/28/2020, PharmD, BCPS 07/26/2020 9:13 AM

## 2020-07-26 NOTE — Plan of Care (Signed)
  Problem: Clinical Measurements: Goal: Ability to maintain clinical measurements within normal limits will improve Outcome: Progressing   Problem: Clinical Measurements: Goal: Will remain free from infection Outcome: Progressing   Problem: Activity: Goal: Risk for activity intolerance will decrease Outcome: Progressing   Problem: Nutrition: Goal: Adequate nutrition will be maintained Outcome: Progressing   Problem: Pain Managment: Goal: General experience of comfort will improve Outcome: Progressing   Problem: Safety: Goal: Ability to remain free from injury will improve Outcome: Progressing   Problem: Skin Integrity: Goal: Risk for impaired skin integrity will decrease Outcome: Progressing

## 2020-07-26 NOTE — Progress Notes (Addendum)
ANTICOAGULATION CONSULT NOTE - follow up  Pharmacy Consult for IV heparin (while PTA Apixaban on hold) Indication: recent PE, bilateral DVT   Allergies  Allergen Reactions  . T-Pa [Alteplase] Swelling    Does okay with it if premedicated with benadryl.  . Codeine Itching    Patient Measurements: Height: 5\' 2"  (157.5 cm) Weight: 80.7 kg (177 lb 14.6 oz) IBW/kg (Calculated) : 50.1 Heparin Dosing Weight: 68 kg  Vital Signs: Temp: 98.6 F (37 C) (12/14 2117) Temp Source: Oral (12/14 2117) BP: 151/79 (12/14 2117) Pulse Rate: 84 (12/14 2117)  Labs: Recent Labs    07/24/20 0418 07/25/20 0400 07/25/20 1549 07/26/20 0103  HGB 9.2* 8.6*  --  9.0*  HCT 31.3* 29.5*  --  30.7*  PLT 343 327  --  332  APTT  --   --  33 53*  LABPROT  --   --  17.8*  --   INR  --   --  1.5*  --   HEPARINUNFRC  --   --  >2.20*  --   CREATININE 0.45 0.43*  --  0.41*    Estimated Creatinine Clearance: 55.2 mL/min (A) (by C-G formula based on SCr of 0.41 mg/dL (L)).   Medical History: Past Medical History:  Diagnosis Date  . Arthritis   . Asthma   . Chronic back pain   . Depression   . History of pulmonary embolus (PE)   . Hx-TIA (transient ischemic attack)   . Hyperlipemia   . Hypertension   . Personal history of DVT (deep vein thrombosis)   . Scoliosis   . Sleep apnea    cpap - settingsi at 3   . Stroke (HCC)   . 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 Neurosurgery plans for surgical debridement and removal of metal hardware. Apixaban held, and pharmacy asked to transition to IV heparin infusion. Last dose of Apixaban 5mg  PO BID on 07/24/20 at 2100. CBC: Hgb 8.6, Pltc WNL.   07/26/2020 APTT 53 subtherapeutic on 850 units/hr Hgb 9, plts WNL No bleeding or  line interruptions per RN   Goal of Therapy:  Heparin level 0.3-0.5 units/ml  aPTT 66-84 seconds *Target lower end of goal range per Dr. 2101 given history of hematoma on past admission  Monitor platelets by anticoagulation protocol: Yes   Plan:   Increase heparin drip to 950 units/hr    will use aPTT initially for dose titration/monitoring until aPTT and heparin level correlate.   aPTT in 6 hours  Daily CBC, heparin level, aPTT  Neurosurgery: please indicate when to stop heparin infusion prior to OR    07/28/2020 RPh 07/26/2020, 2:49 AM

## 2020-07-27 ENCOUNTER — Inpatient Hospital Stay (HOSPITAL_COMMUNITY): Admission: RE | Admit: 2020-07-27 | Payer: Medicare Other | Source: Home / Self Care | Admitting: Neurological Surgery

## 2020-07-27 ENCOUNTER — Encounter (HOSPITAL_COMMUNITY): Payer: Self-pay | Admitting: Internal Medicine

## 2020-07-27 ENCOUNTER — Inpatient Hospital Stay (HOSPITAL_COMMUNITY): Payer: Medicare Other | Admitting: Registered Nurse

## 2020-07-27 ENCOUNTER — Encounter (HOSPITAL_COMMUNITY)
Admission: EM | Disposition: A | Discharge status: Skilled Nursing Facility | Payer: Self-pay | Source: Home / Self Care | Attending: Internal Medicine

## 2020-07-27 HISTORY — PX: HARDWARE REMOVAL: SHX979

## 2020-07-27 HISTORY — PX: LUMBAR WOUND DEBRIDEMENT: SHX1988

## 2020-07-27 LAB — CBC
HCT: 30.8 % — ABNORMAL LOW (ref 36.0–46.0)
Hemoglobin: 9.3 g/dL — ABNORMAL LOW (ref 12.0–15.0)
MCH: 28.4 pg (ref 26.0–34.0)
MCHC: 30.2 g/dL (ref 30.0–36.0)
MCV: 94.2 fL (ref 80.0–100.0)
Platelets: 309 10*3/uL (ref 150–400)
RBC: 3.27 MIL/uL — ABNORMAL LOW (ref 3.87–5.11)
RDW: 16.8 % — ABNORMAL HIGH (ref 11.5–15.5)
WBC: 11.3 10*3/uL — ABNORMAL HIGH (ref 4.0–10.5)
nRBC: 0.3 % — ABNORMAL HIGH (ref 0.0–0.2)

## 2020-07-27 LAB — COMPREHENSIVE METABOLIC PANEL
ALT: 34 U/L (ref 0–44)
AST: 26 U/L (ref 15–41)
Albumin: 2.2 g/dL — ABNORMAL LOW (ref 3.5–5.0)
Alkaline Phosphatase: 64 U/L (ref 38–126)
Anion gap: 9 (ref 5–15)
BUN: 7 mg/dL — ABNORMAL LOW (ref 8–23)
CO2: 26 mmol/L (ref 22–32)
Calcium: 8.8 mg/dL — ABNORMAL LOW (ref 8.9–10.3)
Chloride: 105 mmol/L (ref 98–111)
Creatinine, Ser: 0.34 mg/dL — ABNORMAL LOW (ref 0.44–1.00)
GFR, Estimated: >60 mL/min (ref >60)
Glucose, Bld: 91 mg/dL (ref 70–99)
Potassium: 3.3 mmol/L — ABNORMAL LOW (ref 3.5–5.1)
Sodium: 140 mmol/L (ref 135–145)
Total Bilirubin: 0.9 mg/dL (ref 0.3–1.2)
Total Protein: 6.5 g/dL (ref 6.5–8.1)

## 2020-07-27 LAB — APTT: aPTT: 75 seconds — ABNORMAL HIGH (ref 24–36)

## 2020-07-27 LAB — HEPARIN LEVEL (UNFRACTIONATED): Heparin Unfractionated: 1.6 IU/mL — ABNORMAL HIGH (ref 0.30–0.70)

## 2020-07-27 SURGERY — REMOVAL, HARDWARE
Anesthesia: General

## 2020-07-27 MED ORDER — ROCURONIUM BROMIDE 10 MG/ML (PF) SYRINGE
PREFILLED_SYRINGE | INTRAVENOUS | Status: DC | PRN
  Administered 2020-07-27 (×2): 20 mg via INTRAVENOUS
  Administered 2020-07-27: 60 mg via INTRAVENOUS

## 2020-07-27 MED ORDER — MORPHINE SULFATE (PF) 2 MG/ML IV SOLN
2.0000 mg | INTRAVENOUS | Status: DC | PRN
  Administered 2020-07-27 – 2020-08-05 (×10): 2 mg via INTRAVENOUS
  Filled 2020-07-27 (×10): qty 1

## 2020-07-27 MED ORDER — SODIUM CHLORIDE 0.9 % IV SOLN
250.0000 mL | INTRAVENOUS | Status: DC
  Administered 2020-07-27: 22:00:00 250 mL via INTRAVENOUS

## 2020-07-27 MED ORDER — FENTANYL CITRATE (PF) 100 MCG/2ML IJ SOLN
INTRAMUSCULAR | Status: AC
  Filled 2020-07-27: qty 2

## 2020-07-27 MED ORDER — SODIUM CHLORIDE 0.9% FLUSH: 3.0000 mL | INTRAVENOUS | Status: DC | PRN

## 2020-07-27 MED ORDER — POTASSIUM CHLORIDE 10 MEQ/100ML IV SOLN
10.0000 meq | INTRAVENOUS | Status: AC
  Administered 2020-07-27 (×2): 10 meq via INTRAVENOUS
  Filled 2020-07-27 (×2): qty 100

## 2020-07-27 MED ORDER — FLEET ENEMA 7-19 GM/118ML RE ENEM: 1.0000 | ENEMA | Freq: Once | RECTAL | Status: DC | PRN

## 2020-07-27 MED ORDER — VANCOMYCIN HCL IN DEXTROSE 1-5 GM/200ML-% IV SOLN
1000.0000 mg | INTRAVENOUS | Status: DC
  Filled 2020-07-27: qty 200

## 2020-07-27 MED ORDER — CHLORHEXIDINE GLUCONATE 0.12 % MT SOLN
15.0000 mL | OROMUCOSAL | Status: DC
  Filled 2020-07-27: qty 15

## 2020-07-27 MED ORDER — 0.9 % SODIUM CHLORIDE (POUR BTL) OPTIME
TOPICAL | Status: DC | PRN
  Administered 2020-07-27: 18:00:00 1000 mL

## 2020-07-27 MED ORDER — PROPOFOL 10 MG/ML IV BOLUS
INTRAVENOUS | Status: AC
  Filled 2020-07-27: qty 20

## 2020-07-27 MED ORDER — ONDANSETRON HCL 4 MG/2ML IJ SOLN
INTRAMUSCULAR | Status: DC | PRN
  Administered 2020-07-27: 4 mg via INTRAVENOUS

## 2020-07-27 MED ORDER — FENTANYL CITRATE (PF) 100 MCG/2ML IJ SOLN
INTRAMUSCULAR | Status: AC
  Administered 2020-07-27: 16:00:00 50 ug via INTRAVENOUS
  Filled 2020-07-27: qty 2

## 2020-07-27 MED ORDER — VANCOMYCIN HCL 1500 MG/300ML IV SOLN
1500.0000 mg | Freq: Once | INTRAVENOUS | Status: AC
  Administered 2020-07-27: 23:00:00 1500 mg via INTRAVENOUS
  Filled 2020-07-27 (×2): qty 300

## 2020-07-27 MED ORDER — FENTANYL CITRATE (PF) 250 MCG/5ML IJ SOLN
INTRAMUSCULAR | Status: AC
  Filled 2020-07-27: qty 5

## 2020-07-27 MED ORDER — DOCUSATE SODIUM 100 MG PO CAPS
100.0000 mg | ORAL_CAPSULE | Freq: Two times a day (BID) | ORAL | Status: DC
  Administered 2020-07-27 – 2020-07-28 (×2): 100 mg via ORAL
  Filled 2020-07-27 (×2): qty 1

## 2020-07-27 MED ORDER — FENTANYL CITRATE (PF) 100 MCG/2ML IJ SOLN: 50.0000 ug | Freq: Once | INTRAMUSCULAR | Status: AC

## 2020-07-27 MED ORDER — PROPOFOL 10 MG/ML IV BOLUS
INTRAVENOUS | Status: DC | PRN
  Administered 2020-07-27: 100 mg via INTRAVENOUS

## 2020-07-27 MED ORDER — HEMOSTATIC AGENTS (NO CHARGE) OPTIME
TOPICAL | Status: DC | PRN
  Administered 2020-07-27: 1 via TOPICAL

## 2020-07-27 MED ORDER — LACTATED RINGERS IV SOLN: INTRAVENOUS | Status: DC

## 2020-07-27 MED ORDER — ALUM & MAG HYDROXIDE-SIMETH 200-200-20 MG/5ML PO SUSP
30.0000 mL | Freq: Four times a day (QID) | ORAL | Status: DC | PRN
Start: 2020-07-27 — End: 2020-07-28

## 2020-07-27 MED ORDER — APIXABAN 5 MG PO TABS
5.0000 mg | ORAL_TABLET | Freq: Two times a day (BID) | ORAL | Status: DC
  Administered 2020-07-27 – 2020-07-28 (×3): 5 mg via ORAL
  Filled 2020-07-27 (×4): qty 1

## 2020-07-27 MED ORDER — ACETAMINOPHEN 650 MG RE SUPP: 650.0000 mg | RECTAL | Status: DC | PRN

## 2020-07-27 MED ORDER — PHENOL 1.4 % MT LIQD: 1.0000 | OROMUCOSAL | Status: DC | PRN

## 2020-07-27 MED ORDER — LIDOCAINE 2% (20 MG/ML) 5 ML SYRINGE
INTRAMUSCULAR | Status: DC | PRN
  Administered 2020-07-27: 60 mg via INTRAVENOUS

## 2020-07-27 MED ORDER — PHENYLEPHRINE 40 MCG/ML (10ML) SYRINGE FOR IV PUSH (FOR BLOOD PRESSURE SUPPORT)
PREFILLED_SYRINGE | INTRAVENOUS | Status: DC | PRN
  Administered 2020-07-27 (×2): 120 ug via INTRAVENOUS
  Administered 2020-07-27 (×2): 80 ug via INTRAVENOUS

## 2020-07-27 MED ORDER — FENTANYL CITRATE (PF) 100 MCG/2ML IJ SOLN
25.0000 ug | INTRAMUSCULAR | Status: DC | PRN
  Administered 2020-07-27: 25 ug via INTRAVENOUS

## 2020-07-27 MED ORDER — PROCHLORPERAZINE EDISYLATE 10 MG/2ML IJ SOLN
10.0000 mg | Freq: Four times a day (QID) | INTRAMUSCULAR | Status: DC | PRN
  Administered 2020-07-27: 10 mg via INTRAVENOUS
  Filled 2020-07-27: qty 2

## 2020-07-27 MED ORDER — SODIUM CHLORIDE 0.9% FLUSH
3.0000 mL | Freq: Two times a day (BID) | INTRAVENOUS | Status: DC
  Administered 2020-07-28: 12:00:00 3 mL via INTRAVENOUS

## 2020-07-27 MED ORDER — SODIUM CHLORIDE 0.9 % IV SOLN
2.0000 g | Freq: Two times a day (BID) | INTRAVENOUS | Status: DC
  Administered 2020-07-27 – 2020-07-28 (×2): 2 g via INTRAVENOUS
  Filled 2020-07-27 (×3): qty 2

## 2020-07-27 MED ORDER — POLYETHYLENE GLYCOL 3350 17 G PO PACK: 17.0000 g | PACK | Freq: Every day | ORAL | Status: DC | PRN

## 2020-07-27 MED ORDER — THROMBIN 5000 UNITS EX SOLR
CUTANEOUS | Status: DC | PRN
  Administered 2020-07-27: 10000 [IU] via TOPICAL

## 2020-07-27 MED ORDER — FENTANYL CITRATE (PF) 250 MCG/5ML IJ SOLN
INTRAMUSCULAR | Status: DC | PRN
  Administered 2020-07-27 (×5): 50 ug via INTRAVENOUS

## 2020-07-27 MED ORDER — BISACODYL 10 MG RE SUPP
10.0000 mg | Freq: Every day | RECTAL | Status: DC | PRN
  Administered 2020-08-01: 03:00:00 10 mg via RECTAL
  Filled 2020-07-27: qty 1

## 2020-07-27 MED ORDER — ACETAMINOPHEN 500 MG PO TABS: 1000.0000 mg | ORAL_TABLET | Freq: Once | ORAL | Status: DC

## 2020-07-27 MED ORDER — ACETAMINOPHEN 325 MG PO TABS
650.0000 mg | ORAL_TABLET | ORAL | Status: DC | PRN
Start: 2020-07-27 — End: 2020-07-27

## 2020-07-27 MED ORDER — POTASSIUM CHLORIDE CRYS ER 20 MEQ PO TBCR
40.0000 meq | EXTENDED_RELEASE_TABLET | Freq: Once | ORAL | Status: AC
  Administered 2020-07-27: 23:00:00 40 meq via ORAL
  Filled 2020-07-27: qty 2

## 2020-07-27 MED ORDER — MENTHOL 3 MG MT LOZG: 1.0000 | LOZENGE | OROMUCOSAL | Status: DC | PRN

## 2020-07-27 MED ORDER — SENNA 8.6 MG PO TABS: 1.0000 | ORAL_TABLET | Freq: Two times a day (BID) | ORAL | Status: DC

## 2020-07-27 MED ORDER — EPHEDRINE SULFATE-NACL 50-0.9 MG/10ML-% IV SOSY
PREFILLED_SYRINGE | INTRAVENOUS | Status: DC | PRN
  Administered 2020-07-27: 10 mg via INTRAVENOUS

## 2020-07-27 SURGICAL SUPPLY — 61 items
BENZOIN TINCTURE PRP APPL 2/3 (GAUZE/BANDAGES/DRESSINGS) IMPLANT
BLADE CLIPPER SURG (BLADE) IMPLANT
CABLE BIPOLOR RESECTION CORD (MISCELLANEOUS) IMPLANT
CANISTER SUCT 3000ML PPV (MISCELLANEOUS) ×3 IMPLANT
CARTRIDGE OIL MAESTRO DRILL (MISCELLANEOUS) IMPLANT
CLOSURE WOUND 1/2 X4 (GAUZE/BANDAGES/DRESSINGS)
COVER WAND RF STERILE (DRAPES) ×6 IMPLANT
DECANTER SPIKE VIAL GLASS SM (MISCELLANEOUS) IMPLANT
DIFFUSER DRILL AIR PNEUMATIC (MISCELLANEOUS) IMPLANT
DRAPE C-ARM 42X72 X-RAY (DRAPES) ×6 IMPLANT
DRAPE LAPAROTOMY 100X72 PEDS (DRAPES) ×3 IMPLANT
DRAPE LAPAROTOMY 100X72X124 (DRAPES) ×3 IMPLANT
DURAPREP 26ML APPLICATOR (WOUND CARE) IMPLANT
ELECT BLADE 4.0 EZ CLEAN MEGAD (MISCELLANEOUS) ×3
ELECT REM PT RETURN 9FT ADLT (ELECTROSURGICAL) ×3
ELECTRODE BLDE 4.0 EZ CLN MEGD (MISCELLANEOUS) ×1 IMPLANT
ELECTRODE REM PT RTRN 9FT ADLT (ELECTROSURGICAL) ×1 IMPLANT
EVACUATOR 1/8 PVC DRAIN (DRAIN) ×3 IMPLANT
GAUZE 4X4 16PLY RFD (DISPOSABLE) IMPLANT
GAUZE SPONGE 4X4 12PLY STRL (GAUZE/BANDAGES/DRESSINGS) ×9 IMPLANT
GAUZE SPONGE 4X4 12PLY STRL LF (GAUZE/BANDAGES/DRESSINGS) ×3 IMPLANT
GLOVE BIO SURGEON STRL SZ 6.5 (GLOVE) IMPLANT
GLOVE BIO SURGEON STRL SZ8 (GLOVE) ×3 IMPLANT
GLOVE BIO SURGEONS STRL SZ 6.5 (GLOVE)
GLOVE BIOGEL PI IND STRL 6.5 (GLOVE) ×1 IMPLANT
GLOVE BIOGEL PI IND STRL 8.5 (GLOVE) ×1 IMPLANT
GLOVE BIOGEL PI INDICATOR 6.5 (GLOVE) ×2
GLOVE BIOGEL PI INDICATOR 8.5 (GLOVE) ×2
GLOVE ECLIPSE 8.5 STRL (GLOVE) ×6 IMPLANT
GLOVE EXAM NITRILE XL STR (GLOVE) IMPLANT
GOWN STRL REUS W/ TWL LRG LVL3 (GOWN DISPOSABLE) ×1 IMPLANT
GOWN STRL REUS W/ TWL XL LVL3 (GOWN DISPOSABLE) IMPLANT
GOWN STRL REUS W/TWL 2XL LVL3 (GOWN DISPOSABLE) ×3 IMPLANT
GOWN STRL REUS W/TWL LRG LVL3 (GOWN DISPOSABLE) ×3
GOWN STRL REUS W/TWL XL LVL3 (GOWN DISPOSABLE)
KIT BASIN OR (CUSTOM PROCEDURE TRAY) ×3 IMPLANT
KIT TURNOVER KIT B (KITS) ×6 IMPLANT
MARKER SKIN DUAL TIP RULER LAB (MISCELLANEOUS) ×3 IMPLANT
NEEDLE HYPO 22GX1.5 SAFETY (NEEDLE) ×3 IMPLANT
NS IRRIG 1000ML POUR BTL (IV SOLUTION) ×3 IMPLANT
OIL CARTRIDGE MAESTRO DRILL (MISCELLANEOUS)
PACK LAMINECTOMY NEURO (CUSTOM PROCEDURE TRAY) ×3 IMPLANT
PAD ARMBOARD 7.5X6 YLW CONV (MISCELLANEOUS) ×6 IMPLANT
RASP 3.0MM (RASP) IMPLANT
SPONGE LAP 4X18 RFD (DISPOSABLE) IMPLANT
SPONGE SURGIFOAM ABS GEL SZ50 (HEMOSTASIS) ×3 IMPLANT
STAPLER SKIN PROX WIDE 3.9 (STAPLE) IMPLANT
STRIP CLOSURE SKIN 1/2X4 (GAUZE/BANDAGES/DRESSINGS) IMPLANT
SUT VIC AB 0 CT1 18XCR BRD8 (SUTURE) ×2 IMPLANT
SUT VIC AB 0 CT1 8-18 (SUTURE) ×6
SUT VIC AB 2-0 CP2 18 (SUTURE) ×3 IMPLANT
SUT VIC AB 3-0 SH 8-18 (SUTURE) ×6 IMPLANT
SUT VIC AB 4-0 RB1 18 (SUTURE) IMPLANT
SWAB COLLECTION DEVICE MRSA (MISCELLANEOUS) IMPLANT
SWAB CULTURE ESWAB REG 1ML (MISCELLANEOUS) IMPLANT
SYR CONTROL 10ML LL (SYRINGE) IMPLANT
TAPE CLOTH SURG 6X10 WHT LF (GAUZE/BANDAGES/DRESSINGS) ×3 IMPLANT
TOWEL GREEN STERILE (TOWEL DISPOSABLE) ×6 IMPLANT
TOWEL GREEN STERILE FF (TOWEL DISPOSABLE) ×6 IMPLANT
WATER STERILE IRR 1000ML POUR (IV SOLUTION) ×3 IMPLANT
YANKAUER SUCT BULB TIP NO VENT (SUCTIONS) ×3 IMPLANT

## 2020-07-27 NOTE — Progress Notes (Signed)
Pharmacy Antibiotic Note  [name] is a 79 y.o. female admitted on 07/21/2020 with decubitus wound.  Pharmacy has been consulted for vancomycin and cefepime dosing.  Underwent removal of spinal fusion hardware and debridement of decubitus wound with primary closure on 12/16. WBC 11.3, afebrile. Scr 0.3 (CrCl 54 mL/min). Last dose of vancomycin on 12/11, cefepime on 12/12.   Plan: Vancomycin 1500 mg IV once then 1g IV every 12 hours  Cefepime 2g IV every 12 hours Monitor renal fx, cx results, clinical pic, and VT as appropriate   Height: 5\' 2"  (157.5 cm) Weight: 79.5 kg (175 lb 4.3 oz) IBW/kg (Calculated) : 50.1  Temp (24hrs), Avg:97.9 F (36.6 C), Min:97.6 F (36.4 C), Max:98.1 F (36.7 C)  Recent Labs  Lab 07/21/20 1340 07/21/20 1403 07/21/20 1529 07/21/20 1733 07/22/20 0435 07/23/20 0458 07/23/20 1712 07/24/20 0418 07/25/20 0400 07/26/20 0103 07/27/20 0429  WBC 11.1*  --   --   --    < > 11.9*  --  10.2 11.1* 11.4* 11.3*  CREATININE  --    < >  --   --    < > 0.40* 0.43* 0.45 0.43* 0.41* 0.34*  LATICACIDVEN 3.0*  --  2.0* 1.9  --   --   --   --   --   --   --    < > = values in this interval not displayed.    Estimated Creatinine Clearance: 54.8 mL/min (A) (by C-G formula based on SCr of 0.34 mg/dL (L)).    Allergies  Allergen Reactions  . T-Pa [Alteplase] Swelling    Does okay with it if premedicated with benadryl.  . Codeine Itching    Antimicrobials this admission: Vancomycin 12/10>>12/11, 12/16 >>  Cefepime 12/10>>12/12, 12/16 >>   Dose adjustments this admission: N/A  Microbiology results: 12/10 BCx: ngtd   Thank you for allowing pharmacy to be a part of this patient's care.  14/10, PharmD, BCCCP Clinical Pharmacist  Phone: [phone] 07/27/2020 8:46 PM  Please check AMION for all [facility] Pharmacy phone numbers After 10:00 PM, call Main Pharmacy [phone]

## 2020-07-27 NOTE — Progress Notes (Signed)
ANTICOAGULATION CONSULT NOTE - Follow Up Consult  Pharmacy Consult for Heparin IV (while PTA Apixaban on hold) Indication: recent PE, bilateral DVT   Allergies  Allergen Reactions  . T-Pa [Alteplase] Swelling    Does okay with it if premedicated with benadryl.  . Codeine Itching    Patient Measurements: Height: 5\' 2"  (157.5 cm) Weight: 79.5 kg (175 lb 4.3 oz) IBW/kg (Calculated) : 50.1 Heparin Dosing Weight: 68 kg  Vital Signs: Temp: 97.9 F (36.6 C) (12/16 0453) Temp Source: Oral (12/16 0453) BP: 153/90 (12/16 0453) Pulse Rate: 84 (12/16 0453)  Labs: Recent Labs    07/25/20 0400 07/25/20 0400 07/25/20 1549 07/26/20 0103 07/26/20 0853 07/26/20 1616 07/27/20 0429  HGB 8.6*  --   --  9.0*  --   --  9.3*  HCT 29.5*  --   --  30.7*  --   --  30.8*  PLT 327  --   --  332  --   --  309  APTT  --    < > 33 53* 72* 79* 75*  LABPROT  --   --  17.8*  --   --   --   --   INR  --   --  1.5*  --   --   --   --   HEPARINUNFRC  --   --  >2.20* >2.20*  --   --  1.60*  CREATININE 0.43*  --   --  0.41*  --   --  0.34*   < > = values in this interval not displayed.    Estimated Creatinine Clearance: 54.8 mL/min (A) (by C-G formula based on SCr of 0.34 mg/dL (L)).   Medications:  Infusions:  . heparin 950 Units/hr (07/26/20 1935)  . potassium chloride      Assessment: Patient's an 79 y.o F on Apixaban PTA for recent PE and bilateral DVT s/p IVC filter admitted for severesepsis secondaryunstageable ulcer of thedistal thoracic/early lumbarspinewith exposed hardware consistent with osteomyelitis. Neurosurgery plans for surgical debridement and removal of metal hardware. Apixaban held, and pharmacy asked to transition to IV heparin infusion.  - Last dose of Apixaban 5mg  PO BID on 07/24/20 at 2100  Today, 07/27/2020: - aPTT 75, therapeutic on heparin 950 units/hr - HL 1.6, elevated but decreasing as expected from DOAC interaction - CBC: Hgb 9.3, Plt WNL - No active  bleeding reported   Goal of Therapy:  *Target lower end of goal range per Dr. 2101 given history of hematoma on past admission  Heparin level 0.3-0.5 units/ml aPTT 66-84 seconds Monitor platelets by anticoagulation protocol: Yes   Plan:  - Continue heparin IV infusion at 950 units/hr - Heparin level likely to be falsely elevated from recent DOAC. Therefore, will use aPTT initially for dose titration/monitoring until aPTT and heparin levels correlate.  - Daily CBC, heparin level, aPTT - Neurosurgery: please indicate when to stop heparin infusion prior to OR   07/29/2020 PharmD, BCPS Clinical Pharmacist [facility] main pharmacy [phone] 07/27/2020 7:41 AM

## 2020-07-27 NOTE — Care Management Important Message (Signed)
Important Message  Patient Details IM Letter given to the Patient. Name: [name] MRN: [id number] Date of Birth: October 10, 1940   Medicare Important Message Given:  Yes     Caren Macadam 07/27/2020, 11:58 AM
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
## 2020-07-27 NOTE — Consult Note (Addendum)
Medical Consultation   [name]  ZOX:[id number]  DOB: [birth year]  DOA: 07/21/2020  PCP: [name], MD  Outpatient Specialists: Neurosurgery, cardiology, behavioral health, podiatry.   Requesting physician: Dr. Danielle Dess  Reason for consultation: Medical management post neurosurgical procedure : Lumbar decubitus  wound with exposed spinal fusion hardware, removal of spinal fusion hardware, debridement of decubitus wound with primary closure.  History of Present Illness: [name] is an 79 y.o. 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  Patient was admitted at [facility] along with sepsis secondary to unstageable decubitus ulcer of distal thoracic spine with exposed metal hardware.  CT scan of the abdomen while in the ED at [facility] showed possible DVT of the right common femoral vein with large colonic stool burden.  CT lumbar spine showed skin breakdown at the fusion of the visible hardware but no evidence of soft tissue abscess.  Patient was started on vancomycin and cefepime and blood cultures were obtained.  She was then admitted to the hospital for further evaluation and treatment.  Hospitalist course was complicated by exposed metal hardware.  She was seen by ID who recommended discontinuation of antibiotics with close monitoring.  She was also seen by neurosurgery recommendation for surgical debridement which the patient was agreeable to.  On 07/27/2020 the patient was transferred to [facility] for  debridement.  She is POD #0 post removal of spinal fusion hardware, debridement of decubitus wound with primary closure by neurosurgery Dr. Danielle Dess.  TRH was asked to consult on this patient for assistance with medical management.  She was seen and examined in PACU 13.  Alert and in no acute distress.  Denies pain.     Review of Systems:  ROS As per HPI otherwise 10 point review of systems negative.     Past Medical History: Past Medical History:  Diagnosis Date  . Arthritis   . Asthma   . Chronic back pain   . Depression   . History of pulmonary embolus (PE)   . Hx-TIA (transient ischemic attack)   . Hyperlipemia   . Hypertension   . Personal history of DVT (deep vein thrombosis)   . Scoliosis   . Sleep apnea    cpap - settingsi at 2   . Stroke (HCC)   . 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 ARTHROSCOPIC REPAIR ACL  02/03/2004  . IR IVC FILTER PLMT / S&I /IMG GUID/MOD SED  05/10/2020  . LEFT HEART CATH AND CORONARY ANGIOGRAPHY N/A 09/16/2019   Procedure: LEFT HEART CATH AND CORONARY ANGIOGRAPHY;  Surgeon: Yvonne Kendall, MD;  Location: [facility] INVASIVE CV LAB;  Service: Cardiovascular;  Laterality: N/A;  . LOOP RECORDER INSERTION N/A 02/21/2020   Procedure: LOOP RECORDER INSERTION;  Surgeon: Duke Salvia, MD;  Location: [facility] INVASIVE CV LAB;  Service: Cardiovascular;  Laterality: N/A;  . OOPHORECTOMY  1970  . SPINE SURGERY  03/31/2006  . TOTAL KNEE ARTHROPLASTY  Left 05/17/2016   Procedure: LEFT TOTAL KNEE ARTHROPLASTY;  Surgeon: Kathryne Hitch, MD;  Location: [facility] ORS;  Service: Orthopedics;  Laterality: Left;  Adductor Block; Spinal to General  . TUBAL LIGATION  1983     Allergies:   Allergies  Allergen Reactions  . T-Pa [Alteplase] Swelling    Does okay with it if premedicated with benadryl.  . Codeine Itching     Social History:  reports that she quit smoking about 46 years ago. Her  smoking use included cigarettes. She has a 10.00 pack-year smoking history. She has never used smokeless tobacco. She reports that she does not drink alcohol and does not use drugs.   Family History: Family History  Problem Relation Age of Onset  . Other Father        kidney problems    Physical Exam: Vitals:   07/27/20 0453 07/27/20 0459 07/27/20 1530 07/27/20 1936  BP: (!) 153/90   (!) 126/95  Pulse: 84   (!) 101  Resp: 18   11  Temp: 97.9 F (36.6 C)   97.6 F (36.4 C)  TempSrc: Oral     SpO2: 100%   97%  Weight:  79.5 kg 79.5 kg   Height:   5\' 2"  (1.575 m)     Constitutional: Alert and awake, not in any acute distress. Eyes: PERLA, EOMI, irises appear normal, anicteric sclera,  ENMT: external ears and nose appear normal Lips appears normal, oropharynx mucosa, tongue, normal  Neck: neck appears normal, no masses, normal ROM, no thyromegaly, no JVD  CVS: S1-S2 clear, no murmur rubs or gallops, trace LE edema, normal pedal pulses  Respiratory:  clear to auscultation bilaterally, no wheezing, rales or rhonchi. Respiratory effort normal. No accessory muscle use.  Abdomen: soft nontender, nondistended, normal bowel sounds. Musculoskeletal: no cyanosis or clubbing.  Trace lower extremity edema noted bilaterally Neuro: Cranial nerves II-XII intact, strength, sensation, reflexes Psych: judgement and insight appear appropriate for condition and setting, stable mood and affect.  Data reviewed:  I have personally reviewed following labs and imaging studies Labs:  CBC: Recent Labs  Lab 07/21/20 1340 07/22/20 0435 07/23/20 0458 07/24/20 0418 07/25/20 0400 07/26/20 0103 07/27/20 0429  WBC 11.1*   < > 11.9* 10.2 11.1* 11.4* 11.3*  NEUTROABS 9.0*  --   --   --   --   --   --   HGB 12.3   < > 9.3* 9.2* 8.6* 9.0* 9.3*  HCT 40.5   < > 31.8* 31.3* 29.5* 30.7* 30.8*  MCV 94.4   < > 96.4 95.4 95.8 95.9 94.2  PLT 386   < > 336 343 327 332 309   < > = values in this interval not  displayed.    Basic Metabolic Panel: Recent Labs  Lab 07/22/20 0435 07/23/20 0458 07/23/20 1712 07/24/20 0418 07/25/20 0400 07/26/20 0103 07/27/20 0429  NA 144 143 141 144 144 142 140  K 3.7 3.1* 3.4* 3.7 3.7 3.2* 3.3*  CL 109 109 110 109 110 108 105  CO2 25 23 23 24 25 26 26   GLUCOSE 93 86 120* 110* 112* 115* 91  BUN 11 8 9 9 9 9  7*  CREATININE 0.54 0.40* 0.43* 0.45 0.43* 0.41* 0.34*  CALCIUM 8.9 8.6* 8.4* 8.8* 8.9 8.7* 8.8*  MG 2.2 2.0  --  2.1  --   --   --    GFR Estimated Creatinine Clearance: 54.8 mL/min (A) (by C-G formula based on SCr of 0.34  mg/dL (L)). Liver Function Tests: Recent Labs  Lab 07/23/20 0458 07/24/20 0418 07/25/20 0400 07/26/20 0103 07/27/20 0429  AST 38 41 38 37 26  ALT 55* 54* 48* 44 34  ALKPHOS 56 55 53 60 64  BILITOT 0.8 0.6 0.5 0.5 0.9  PROT 6.3* 6.2* 6.2* 6.2* 6.5  ALBUMIN 2.0* 2.0* 2.1* 2.1* 2.2*   No results for input(s): LIPASE, AMYLASE in the last 168 hours. No results for input(s): AMMONIA in the last 168 hours. Coagulation profile Recent Labs  Lab 07/25/20 1549  INR 1.5*    Cardiac Enzymes: No results for input(s): CKTOTAL, CKMB, CKMBINDEX, TROPONINI in the last 168 hours. BNP: Invalid input(s): POCBNP CBG: No results for input(s): GLUCAP in the last 168 hours. D-Dimer No results for input(s): DDIMER in the last 72 hours. Hgb A1c No results for input(s): HGBA1C in the last 72 hours. Lipid Profile No results for input(s): CHOL, HDL, LDLCALC, TRIG, CHOLHDL, LDLDIRECT in the last 72 hours. Thyroid function studies No results for input(s): TSH, T4TOTAL, T3FREE, THYROIDAB in the last 72 hours.  Invalid input(s): FREET3 Anemia work up No results for input(s): VITAMINB12, FOLATE, FERRITIN, TIBC, IRON, RETICCTPCT in the last 72 hours. Urinalysis    Component Value Date/Time   COLORURINE YELLOW 07/21/2020 1809   APPEARANCEUR CLEAR 07/21/2020 1809   LABSPEC 1.015 07/21/2020 1809   PHURINE 6.0 07/21/2020 1809   GLUCOSEU  NEGATIVE 07/21/2020 1809   HGBUR NEGATIVE 07/21/2020 1809   BILIRUBINUR NEGATIVE 07/21/2020 1809   KETONESUR NEGATIVE 07/21/2020 1809   PROTEINUR NEGATIVE 07/21/2020 1809   UROBILINOGEN 1.0 02/14/2012 1054   NITRITE NEGATIVE 07/21/2020 1809   LEUKOCYTESUR NEGATIVE 07/21/2020 1809     Microbiology Recent Results (from the past 240 hour(s))  Aerobic Culture (superficial specimen)     Status: None   Collection Time: 07/21/20 12:00 PM   Specimen: Wound  Result Value Ref Range Status   Specimen Description   Final    WOUND BACK Performed at [facility], [address] [place], [place] [address]    Special Requests   Final    NONE Performed at [facility], [address] [place], [place] [address]    Gram Stain   Final    ABUNDANT WBC PRESENT,BOTH PMN AND MONONUCLEAR ABUNDANT GRAM NEGATIVE RODS MODERATE GRAM POSITIVE COCCI Performed at [facility], [address] [place], [place] [address]    Culture   Final    FEW ESCHERICHIA COLI ABUNDANT STREPTOCOCCUS ANGINOSIS    Report Status 07/26/2020 FINAL  Final   Organism ID, Bacteria ESCHERICHIA COLI  Final   Organism ID, Bacteria STREPTOCOCCUS ANGINOSIS  Final      Susceptibility   Escherichia coli - MIC*    AMPICILLIN <=2 SENSITIVE Sensitive     CEFAZOLIN <=4 SENSITIVE Sensitive     CEFEPIME <=0.12 SENSITIVE Sensitive     CEFTAZIDIME <=1 SENSITIVE Sensitive     CEFTRIAXONE <=0.25 SENSITIVE Sensitive     CIPROFLOXACIN <=0.25 SENSITIVE Sensitive     GENTAMICIN <=1 SENSITIVE Sensitive     IMIPENEM <=0.25 SENSITIVE Sensitive     TRIMETH/SULFA <=20 SENSITIVE Sensitive     AMPICILLIN/SULBACTAM <=2 SENSITIVE Sensitive     PIP/TAZO <=4 SENSITIVE Sensitive     * FEW ESCHERICHIA COLI   Streptococcus anginosis - MIC*    PENICILLIN <=0.06 SENSITIVE Sensitive     CEFTRIAXONE 0.25 SENSITIVE Sensitive     ERYTHROMYCIN <=0.12 SENSITIVE Sensitive     LEVOFLOXACIN 0.5 SENSITIVE  Sensitive      VANCOMYCIN 0.5 SENSITIVE Sensitive     * ABUNDANT STREPTOCOCCUS ANGINOSIS  Blood culture (routine x 2)     Status: None   Collection Time: 07/21/20  1:40 PM   Specimen: BLOOD RIGHT WRIST  Result Value Ref Range Status   Specimen Description   Final    BLOOD RIGHT WRIST Performed at [facility], [address], [place], [place] [address]    Special Requests   Final    BOTTLES DRAWN AEROBIC AND ANAEROBIC Blood Culture adequate volume Performed at [facility], [address] [place], [place] [address]    Culture   Final    NO GROWTH 5 DAYS Performed at [facility], [address] [place], [place] [address]    Report Status 07/26/2020 FINAL  Final  Blood culture (routine x 2)     Status: None   Collection Time: 07/21/20  1:52 PM   Specimen: BLOOD  Result Value Ref Range Status   Specimen Description   Final    BLOOD RIGHT ANTECUBITAL Performed at [facility], [address], [place], [place] [address]    Special Requests   Final    BOTTLES DRAWN AEROBIC AND ANAEROBIC Blood Culture results may not be optimal due to an inadequate volume of blood received in culture bottles Performed at [facility], [address], [place], [place] [address]    Culture   Final    NO GROWTH 5 DAYS Performed at [facility], [address] [place] [address], [place], [place] [address]    Report Status 07/26/2020 FINAL  Final  Resp Panel by RT-PCR (Flu A&B, Covid) Nasopharyngeal Swab     Status: None   Collection Time: 07/21/20  1:57 PM   Specimen: Nasopharyngeal Swab; Nasopharyngeal(NP) swabs in vial transport medium  Result Value Ref Range Status   SARS Coronavirus 2 by RT PCR NEGATIVE NEGATIVE Final    Comment: (NOTE) SARS-CoV-2 target nucleic acids are NOT DETECTED.  The SARS-CoV-2 RNA is generally detectable in upper respiratory specimens during the acute phase of infection. The lowest concentration of SARS-CoV-2  viral copies this assay can detect is 138 copies/mL. A negative result does not preclude SARS-Cov-2 infection and should not be used as the sole basis for treatment or other patient management decisions. A negative result may occur with  improper specimen collection/handling, submission of specimen other than nasopharyngeal swab, presence of viral mutation(s) within the areas targeted by this assay, and inadequate number of viral copies(<138 copies/mL). A negative result must be combined with clinical observations, patient history, and epidemiological information. The expected result is Negative.  Fact Sheet for Patients:  BloggerCourse.com  Fact Sheet for Healthcare Providers:  SeriousBroker.it  This test is no t yet approved or cleared by the [place] FDA and  has been authorized for detection and/or diagnosis of SARS-CoV-2 by FDA under an Emergency Use Authorization (EUA). This EUA will remain  in effect (meaning this test can be used) for the duration of the COVID-19 declaration under Section 564(b)(1) of the Act, 21 U.S.C.section 360bbb-3(b)(1), unless the authorization is terminated  or revoked sooner.       Influenza A by PCR NEGATIVE NEGATIVE Final   Influenza B by PCR NEGATIVE NEGATIVE Final    Comment: (NOTE) The Xpert Xpress SARS-CoV-2/FLU/RSV plus assay is intended as an aid in the diagnosis of influenza from Nasopharyngeal swab specimens and should not be used as a sole  basis for treatment. Nasal washings and aspirates are unacceptable for Xpert Xpress SARS-CoV-2/FLU/RSV testing.  Fact Sheet for Patients: BloggerCourse.com  Fact Sheet for Healthcare Providers: SeriousBroker.it  This test is not yet approved or cleared by the [place] FDA and has been authorized for detection and/or diagnosis of SARS-CoV-2 by FDA under an Emergency Use Authorization  (EUA). This EUA will remain in effect (meaning this test can be used) for the duration of the COVID-19 declaration under Section 564(b)(1) of the Act, 21 U.S.C. section 360bbb-3(b)(1), unless the authorization is terminated or revoked.  Performed at [facility], [address] [place], [place] [address]        Inpatient Medications:   Scheduled Meds: . acetaminophen  1,000 mg Oral Once  . [MAR Hold] busPIRone  15 mg Oral BID  . chlorhexidine  15 mL Mouth/Throat NOW  . [MAR Hold] donepezil  10 mg Oral QHS  . [MAR Hold] dronabinol  2.5 mg Oral BID AC  . [MAR Hold] feeding supplement  237 mL Oral TID BM  . [MAR Hold] linaclotide  145 mcg Oral Daily  . [MAR Hold] loratadine  10 mg Oral Daily  . [MAR Hold] memantine  28 mg Oral QHS  . [MAR Hold] polyethylene glycol  17 g Oral BID  . [MAR Hold] senna-docusate  1 tablet Oral BID   Continuous Infusions: . lactated ringers       Radiological Exams on Admission: No results found.  Impression/Recommendations Active Problems:   Right leg swelling   Depression   History of DVT (deep vein thrombosis)   Dementia (HCC)   Benign essential HTN   Pulmonary embolism (HCC)   Transaminitis   Severe sepsis (HCC)   Hypocalcemia   Osteomyelitis (HCC)   Hardware complicating wound infection (HCC)   Back pain   Pressure injury, unstageable (HCC)  Sepsis, improved, secondary to unstageable ulcer of the distal thoracic/early lumbar spine, POA with exposed hardware, POD #0 post removal of spinal fusion hardware, debridement of decubitus wound with primary closure by neurosurgery Dr. Danielle Dess on 07/27/2020. Sepsis physiology has improved.  Restarted IV vancomycin and cefepime perioperatively.  IV Vancomycin and cefepime were discontinued on 07/23/20 as per ID recommendation. Obtain CBC with differentials in the morning and monitor WBC Pain control  History of DVT and PE She was on heparin drip in anticipation for her  neurosurgical procedure If no further plans for procedures, recommend to restart Eliquis No further surgical plans per Dr. Danielle Dess, home Eliquis restarted.  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   Thank you for this consultation.  Our [facility] hospitalist team will follow the patient with you.   Time Spent:  55 minutes.  Darlin Drop M.D. [facility] 07/27/2020, 7:52 PM

## 2020-07-27 NOTE — Op Note (Signed)
Date of surgery: 07/27/2020 Preoperative diagnosis: Lumbar decubitus wound with exposed spinal fusion hardware. Postoperative diagnosis: Same Procedure: Removal of's spinal fusion hardware, debridement of decubitus wound with primary closure. Surgeon: Barnett Abu First Assistant: Monia Pouch, DO Anesthesia: General endotracheal Indications: [name] is an 79 year old individual who has had significant decubitus ulcer develop over the past few weeks as she has been severely debilitated suffering from a history of stroke and a recent pulmonary embolus that resulted in periods of confusion and agitation.  She was readmitted to the hospital here recently when it was noted that she had a large decubitus ulcer in the lumbar spine and hardware was exposed.  She has been being treated with IV anticoagulation and at this time is taken to the operating room to undergo removal of the hardware debridement of the wound possible primary closure.  Procedure: Patient was brought to the room supine on the stretcher of the smooth induction of general endotracheal anesthesia she was carefully turned prone.  The area of the decubitus wound was scrubbed with Hibiclens scrub and then painted with Betadine solution and then draped in an open fashion.  The decubitus was explored and the obvious screw protruding in the middle of the decubitus was identified and some soft tissue around it was cleaned.  There was some eschar in the upper portion of the decubitus and there is some granulation tissue in the lower and lateral portions.  The screw Was then released.  The rod was then trimmed verst and the other screws were sequentially identified.  These were individually removed and a linear incision was created from the bottom part of the decubitus in the direction of the surgery.  This allowed exposure of the hardware which was in areas covered with bone that it healed solidly from the fusion.  Each of the screw caps were then  released and with some difficulty the rod was removed from the right side of the surgical construct.  Once this was accomplished the screw heads were then loosened and the individual screws were removed.  Each of the screws was noted to be firmly in the bone with no evidence of loosening of any of the hardware and in fact the bone had grown contiguous to the screws at every level.  There was a film of gelatinous material over the top 2 screws and rod consistent with the area of infection.  This was debrided.  Once the screws were removed attention was turned to the left-sided rod the left side was under the skin but there was an area of thinning of the skin where the superiormost screw was starting to protrude.  This was removed in a similar fashion.  Again all the hardware was noted to be intact the bone was solid and there is no evidence of infection in the bone thus I do not believe this is consistent with osteomyelitis.  With the hardware removed we then addressed the decubitus ulcer the edges of the skin were debrided sharply to remove all the poorly healing tissue then a large subcutaneous release was performed to allow the loose skin to be brought together in an L-shaped fashion based on the vertical incision that was made to release the hardware.  Large #1 Vicryl sutures were then used in the deep subcutaneous fascia to bring the edges of the skin together and gradually close this wound with a combination of the 0 Vicryl 2-0 and 3-0 Vicryl surgical staples were then placed in the scalp edges.  The wound was closed over 2 medium Hemovac drains.  Blood loss for the procedure was estimated at 100 cc and the patient appeared to tolerate the surgery well.  She was returned to recovery room in stable condition

## 2020-07-27 NOTE — Progress Notes (Addendum)
[name], [name] ([id number]) Visit Report for 07/21/2020 Allergy List Details Patient Name: Date of Service: [name], [name] 07/21/2020 10:30 A M Medical Record Number: [id number] Patient Account Number: [id number] Date of Birth/Sex: Treating RN: Dec 23, 1940 (79 y.o. [name] Primary Care Tyasia Packard: [name] Other Clinician: Referring Nyaisha Simao: Treating Tifanie Gardiner/Extender: Burnetta Sabin in Treatment: 0 Allergies Active Allergies codeine alteplase Allergy Notes Electronic Signature(s) Signed: 07/21/2020 2:11:24 PM By: Yevonne Pax RN Entered By: Yevonne Pax on 07/21/2020 11:24:50 -------------------------------------------------------------------------------- Arrival Information Details Patient Name: Date of Service: [name] D. 07/21/2020 10:30 A M Medical Record Number: [id number] Patient Account Number: [id number] Date of Birth/Sex: Treating RN: 1941/03/19 (79 y.o. [name] Primary Care Caleb Prigmore: [name] Other Clinician: Referring Emiline Mancebo: Treating Jaleil Renwick/Extender: Burnetta Sabin in Treatment: 0 Visit Information Patient Arrived: Stretcher Arrival Time: 11:09 Accompanied By: self Transfer Assistance: Manual Patient Identification Verified: Yes Secondary Verification Process Completed: Yes Patient Requires Transmission-Based Precautions: No Patient Has Alerts: No Electronic Signature(s) Signed: 07/21/2020 2:11:24 PM By: Yevonne Pax RN Entered By: Yevonne Pax on 07/21/2020 11:22:16 -------------------------------------------------------------------------------- Clinic Level of Care Assessment Details Patient Name: Date of Service: [name], [name] 07/21/2020 10:30 A M Medical Record Number: [id number] Patient Account Number: [id number] Date of Birth/Sex: Treating RN: [birth year] (79 y.o. Sarah Link Primary Care Hannibal Skalla: [name] Other Clinician: Referring  Wenona Mayville: Treating Ailyne Pawley/Extender: Burnetta Sabin in Treatment: 0 Clinic Level of Care Assessment Items TOOL 2 Quantity Score X- 1 0 Use when only an EandM is performed on the INITIAL visit ASSESSMENTS - Nursing Assessment / Reassessment X- 1 20 General Physical Exam (combine w/ comprehensive assessment (listed just below) when performed on new pt. evals) X- 1 25 Comprehensive Assessment (HX, ROS, Risk Assessments, Wounds Hx, etc.) ASSESSMENTS - Wound and Skin A ssessment / Reassessment []  - 0 Simple Wound Assessment / Reassessment - one wound X- 2 5 Complex Wound Assessment / Reassessment - multiple wounds []  - 0 Dermatologic / Skin Assessment (not related to wound area) ASSESSMENTS - Ostomy and/or Continence Assessment and Care []  - 0 Incontinence Assessment and Management []  - 0 Ostomy Care Assessment and Management (repouching, etc.) PROCESS - Coordination of Care []  - 0 Simple Patient / Family Education for ongoing care X- 1 20 Complex (extensive) Patient / Family Education for ongoing care X- 1 10 Staff obtains , Records, T Results / Process Orders est []  - 0 Staff telephones HHA, Nursing Homes / Clarify orders / etc []  - 0 Routine Transfer to another Facility (non-emergent condition) []  - 0 Routine Hospital Admission (non-emergent condition) X- 1 15 New Admissions / / Ordering NPWT Apligraf, etc. , []  - 0 Emergency Hospital Admission (emergent condition) []  - 0 Simple Discharge Coordination X- 1 15 Complex (extensive) Discharge Coordination PROCESS - Special Needs []  - 0 Pediatric / Minor Patient Management []  - 0 Isolation Patient Management []  - 0 Hearing / Language / Visual special needs []  - 0 Assessment of Community assistance (transportation, D/C planning, etc.) []  - 0 Additional assistance / Altered mentation []  - 0 Support Surface(s) Assessment (bed, cushion, seat, etc.) INTERVENTIONS  - Wound Cleansing / Measurement []  - 0 Wound Imaging (photographs - any number of wounds) []  - 0 Wound Tracing (instead of photographs) []  - 0 Simple Wound Measurement - one wound X- 2 5 Complex Wound Measurement - multiple wounds []  - 0 Simple Wound Cleansing - one wound X- 2 5 Complex Wound Cleansing - multiple  wounds INTERVENTIONS - Wound Dressings []  - 0 Small Wound Dressing one or multiple wounds X- 1 15 Medium Wound Dressing one or multiple wounds []  - 0 Large Wound Dressing one or multiple wounds []  - 0 Application of Medications - injection INTERVENTIONS - Miscellaneous []  - 0 External ear exam X- 1 5 Specimen Collection (cultures, biopsies, blood, body fluids, etc.) X- 1 5 Specimen(s) / Culture(s) sent or taken to Lab for analysis []  - 0 Patient Transfer (multiple staff / Lift / Similar devices) []  - 0 Simple Staple / Suture removal (25 or less) []  - 0 Complex Staple / Suture removal (26 or more) []  - 0 Hypo / Hyperglycemic Management (close monitor of Blood Glucose) X- 1 15 Ankle / Brachial Index (ABI) - do not check if billed separately Has the patient been seen at the hospital within the last three years: Yes Total Score: 175 Level Of Care: New/Established - Level 5 Electronic Signature(s) Signed: 07/25/2020 5:24:10 PM By: RN, BSN Entered By: on 07/21/2020 12:50:33 -------------------------------------------------------------------------------- Encounter Discharge Information Details Patient Name: Date of Service: Sarah Sites D. 07/21/2020 10:30 A M Medical Record Number: Patient Account Number: Date of Birth/Sex: Treating RN: [birth year] (79 y.o. [name] Primary Care Runell Kovich: 14/05/2020 Other Clinician: Referring Kiasia Chou: Treating Laynie Espy/Extender: [name] in Treatment: 0 Encounter Discharge Information Items Discharge Condition:  Stable Ambulatory Status: Stretcher Discharge Destination: Emergency Room Telephoned: Yes Orders Sent: Yes Transportation: Ambulance Accompanied By: daughter Schedule Follow-up Appointment: No Clinical Summary of Care: Patient Declined Notes Dr. 14/05/2020 called report to ER triage nurse Electronic Signature(s) Signed: 07/25/2020 5:24:10 PM By: [id number] RN, BSN Entered By: 13/07/1941 on 07/21/2020 13:00:54 -------------------------------------------------------------------------------- Multi Wound Chart Details Patient Name: Date of Service: Sarah Link D. 07/21/2020 10:30 A M Medical Record Number: Burnetta Sabin Patient Account Number: Leanord Hawking Date of Birth/Sex: Treating RN: September 06, 1940 (79 y.o. [name] Primary Care Epifanio Labrador: 14/05/2020 Other Clinician: Referring Shondale Quinley: Treating Jacobey Gura/Extender: [name] in Treatment: 0 Vital Signs Height(in): 60 Pulse(bpm): 93 Weight(lbs): 181 Blood Pressure(mmHg): 112/72 Body Mass Index(BMI): 35 Temperature(F): 98.2 Respiratory Rate(breaths/min): 18 Photos: [1:No Photos Thoracic spine] [2:No Photos Right, Lateral Ankle] [N/A:N/A N/A] Wound Location: [1:Gradually Appeared] [2:Gradually Appeared] [N/A:N/A] Wounding Event: [1:Pressure Ulcer] [2:Pressure Ulcer] [N/A:N/A] Primary Etiology: [1:Hypertension, Dementia] [2:Hypertension, Dementia] [N/A:N/A] Comorbid History: [1:06/28/2020] [2:06/28/2020] [N/A:N/A] Date Acquired: [1:0] [2:0] [N/A:N/A] Weeks of Treatment: [1:Open] [2:Open] [N/A:N/A] Wound Status: [1:5x5.5x1.7] [2:0.2x0.2x0.1] [N/A:N/A] Measurements L x W x D (cm) [1:21.598] [2:0.031] [N/A:N/A] A (cm) : rea [1:36.717] [2:0.003] [N/A:N/A] Volume (cm) : [1:11] Starting Position 1 (o'clock): [1:4] Ending Position 1 (o'clock): [1:5.5] Maximum Distance 1 (cm): [1:Yes] [2:No] [N/A:N/A] Undermining: [1:Category/Stage IV] [2:Category/Stage II] [N/A:N/A] Classification:  [1:Medium] [2:Small] [N/A:N/A] Exudate A mount: [1:Serosanguineous] [2:Serosanguineous] [N/A:N/A] Exudate Type: [1:red, brown] [2:red, brown] [N/A:N/A] Exudate Color: [1:Medium (34-66%)] [2:Medium (34-66%)] [N/A:N/A] Granulation A mount: [1:Red, Pink] [2:Pink] [N/A:N/A] Granulation Quality: [1:Medium (34-66%)] [2:Medium (34-66%)] [N/A:N/A] Necrotic A mount: [1:Fat Layer (Subcutaneous Tissue): Yes Fat Layer (Subcutaneous Tissue): Yes N/A] Exposed Structures: [1:Fascia: No Tendon: No Muscle: No Joint: No Bone: No Small (1-33%)] [2:Fascia: No Tendon: No Muscle: No Joint: No Bone: No None] [N/A:N/A] Treatment Notes Electronic Signature(s) Signed: 07/21/2020 5:15:15 PM By: 96 RN, BSN Signed: 07/27/2020 8:09:40 AM By: [name] MD Entered By: Burnetta Sabin on 07/21/2020 12:29:24 -------------------------------------------------------------------------------- Pain Assessment Details Patient Name: Date of Service: 06/30/2020 D. 07/21/2020 10:30 A M Medical Record Number: 14/05/2020 Patient Account Number:  [id number] Date of Birth/Sex: Treating RN: [birth year] (79 y.o. [name] Primary Care Trystyn Sitts: [name] Other Clinician: Referring Clemma Johnsen: Treating Bettina Warn/Extender: Burnetta Sabin in Treatment: 0 Active Problems Location of Pain Severity and Description of Pain Patient Has Paino Yes Site Locations With Dressing Change: Yes Duration of the Pain. Constant / Intermittento Constant Rate the pain. Current Pain Level: 5 Worst Pain Level: 10 Least Pain Level: 3 Character of Pain Describe the Pain: Aching Pain Management and Medication Current Pain Management: Medication: Yes Cold Application: No Rest: Yes Massage: No Activity: No T.E.N.S.: No Heat Application: No Leg drop or elevation: No Is the Current Pain Management Adequate: Inadequate How does your wound impact your activities of daily livingo Sleep:  No Bathing: No Appetite: No Relationship With Others: No Bladder Continence: No Emotions: No Bowel Continence: No Work: No Toileting: No Drive: No Dressing: No Hobbies: No Electronic Signature(s) Signed: 07/21/2020 2:11:24 PM By: Yevonne Pax RN Entered By: Yevonne Pax on 07/21/2020 11:47:54 -------------------------------------------------------------------------------- Patient/Caregiver Education Details Patient Name: Date of Service: [name] 12/10/2021andnbsp10:30 A M Medical Record Number: [id number] Patient Account Number: [id number] Date of Birth/Gender: Treating RN: 1940-10-11 (79 y.o. Sarah Link Primary Care Physician: [name] Other Clinician: Referring Physician: Treating Physician/Extender: Burnetta Sabin in Treatment: 0 Education Assessment Education Provided To: Patient Education Topics Provided Malignant/Atypical Wounds: Methods: Explain/Verbal Responses: State content correctly Wound/Skin Impairment: Methods: Explain/Verbal Responses: State content correctly Electronic Signature(s) Signed: 07/25/2020 5:24:10 PM By: [name] RN, BSN Entered By: [name] on 07/21/2020 12:49:35 -------------------------------------------------------------------------------- Wound Assessment Details Patient Name: Date of Service: [name] D. 07/21/2020 10:30 A M Medical Record Number: [id number] Patient Account Number: [id number] Date of Birth/Sex: Treating RN: 15-May-1941 (79 y.o. [name] Primary Care Naomie Crow: [name] Other Clinician: Referring Carly Sabo: Treating Tauheed Mcfayden/Extender: Burnetta Sabin in Treatment: 0 Wound Status Wound Number: 1 Primary Etiology: Pressure Ulcer Wound Location: Thoracic spine Wound Status: Not Healed Wounding Event: Gradually Appeared Comorbid History: Hypertension, Dementia Date Acquired: 06/28/2020 Weeks Of Treatment:  0 Clustered Wound: No Photos Wound Measurements Length: (cm) 5 Width: (cm) 5.5 Depth: (cm) 1.7 Area: (cm) 21.598 Volume: (cm) 36.717 % Reduction in Area: 0% % Reduction in Volume: 0% Epithelialization: Small (1-33%) Tunneling: No Undermining: Yes Starting Position (o'clock): 11 Ending Position (o'clock): 4 Maximum Distance: (cm) 5.5 Wound Description Classification: Category/Stage IV Exudate Amount: Medium Exudate Type: Serosanguineous Exudate Color: red, brown Foul Odor After Cleansing: No Slough/Fibrino Yes Wound Bed Granulation Amount: Medium (34-66%) Exposed Structure Granulation Quality: Red, Pink Fascia Exposed: No Necrotic Amount: Medium (34-66%) Fat Layer (Subcutaneous Tissue) Exposed: Yes Necrotic Quality: Adherent Slough Tendon Exposed: No Muscle Exposed: No Joint Exposed: No Bone Exposed: No Treatment Notes Wound #1 (Thoracic spine) Cleanser Wound Cleanser Discharge Instruction: Cleanse the wound with wound cleanser prior to applying a clean dressing using gauze sponges, not tissue or cotton balls. Peri-Wound Care Topical Primary Dressing Saline wet to dry Discharge Instruction: Lightly pack Secondary Dressing ComfortFoam Border, 4x4 in (silicone border) Discharge Instruction: Apply over primary dressing as directed. Secured With Compression Wrap Compression Stockings Facilities manager) Signed: 07/27/2020 4:57:41 PM By: Benjaman Kindler EMT/HBOT/SD Signed: 08/03/2020 3:14:10 PM By: Yevonne Pax RN Previous Signature: 07/21/2020 2:11:24 PM Version By: Yevonne Pax RN Entered By: Benjaman Kindler on 07/27/2020 14:12:53 -------------------------------------------------------------------------------- Wound Assessment Details Patient Name: Date of Service: [name] D. 07/21/2020 10:30 A M Medical Record Number: [id number] Patient Account Number: [id number] Date of Birth/Sex: Treating RN: Jul 06, 1941 (  79 y.o. [name] Primary Care Domonick Sittner: [name] Other Clinician: Referring Gradie Butrick: Treating Kalanie Fewell/Extender: Burnetta Sabin in Treatment: 0 Wound Status Wound Number: 2 Primary Etiology: Pressure Ulcer Wound Location: Right, Lateral Ankle Wound Status: Open Wounding Event: Gradually Appeared Comorbid History: Hypertension, Dementia Date Acquired: 06/28/2020 Weeks Of Treatment: 0 Clustered Wound: No Photos Photo Uploaded By: Benjaman Kindler on 07/27/2020 12:38:59 Wound Measurements Length: (cm) 0.2 Width: (cm) 0.2 Depth: (cm) 0.1 Area: (cm) 0.031 Volume: (cm) 0.003 % Reduction in Area: % Reduction in Volume: Epithelialization: None Tunneling: No Undermining: No Wound Description Classification: Category/Stage II Exudate Amount: Small Exudate Type: Serosanguineous Exudate Color: red, brown Foul Odor After Cleansing: No Slough/Fibrino Yes Wound Bed Granulation Amount: Medium (34-66%) Exposed Structure Granulation Quality: Pink Fascia Exposed: No Necrotic Amount: Medium (34-66%) Fat Layer (Subcutaneous Tissue) Exposed: Yes Necrotic Quality: Adherent Slough Tendon Exposed: No Muscle Exposed: No Joint Exposed: No Bone Exposed: No Electronic Signature(s) Signed: 07/21/2020 2:11:24 PM By: Yevonne Pax RN Entered By: Yevonne Pax on 07/21/2020 11:46:45 -------------------------------------------------------------------------------- Vitals Details Patient Name: Date of Service: [name] D. 07/21/2020 10:30 A M Medical Record Number: [id number] Patient Account Number: [id number] Date of Birth/Sex: Treating RN: 1940-12-09 (79 y.o. [name] Primary Care Kesleigh Morson: [name] Other Clinician: Referring Vannie Hochstetler: Treating Satonya Lux/Extender: Burnetta Sabin in Treatment: 0 Vital Signs Time Taken: 11:22 Temperature (F): 98.2 Height (in): 60 Pulse (bpm): 93 Source: Stated Respiratory Rate  (breaths/min): 18 Weight (lbs): 181 Blood Pressure (mmHg): 112/72 Source: Stated Reference Range: 80 - 120 mg / dl Body Mass Index (BMI): 35.3 Electronic Signature(s) Signed: 07/21/2020 2:11:24 PM By: Yevonne Pax RN Entered By: Yevonne Pax on 07/21/2020 11:23:35

## 2020-07-27 NOTE — Progress Notes (Signed)
ANTICOAGULATION CONSULT NOTE - Follow Up Consult  Pharmacy Consult for Heparin IV (while PTA Apixaban on hold) Indication: recent PE, bilateral DVT   Allergies  Allergen Reactions  . T-Pa [Alteplase] Swelling    Does okay with it if premedicated with benadryl.  . Codeine Itching    Patient Measurements: Height: 5\' 2"  (157.5 cm) Weight: 79.5 kg (175 lb 4.3 oz) IBW/kg (Calculated) : 50.1 Heparin Dosing Weight: 68 kg  Vital Signs: Temp: 98 F (36.7 C) (12/16 2030) BP: 114/63 (12/16 2030) Pulse Rate: 100 (12/16 2030)  Labs: Recent Labs    07/25/20 0400 07/25/20 0400 07/25/20 1549 07/26/20 0103 07/26/20 0853 07/26/20 1616 07/27/20 0429  HGB 8.6*  --   --  9.0*  --   --  9.3*  HCT 29.5*  --   --  30.7*  --   --  30.8*  PLT 327  --   --  332  --   --  309  APTT  --    < > 33 53* 72* 79* 75*  LABPROT  --   --  17.8*  --   --   --   --   INR  --   --  1.5*  --   --   --   --   HEPARINUNFRC  --   --  >2.20* >2.20*  --   --  1.60*  CREATININE 0.43*  --   --  0.41*  --   --  0.34*   < > = values in this interval not displayed.    Estimated Creatinine Clearance: 54.8 mL/min (A) (by C-G formula based on SCr of 0.34 mg/dL (L)).   Medications:  Infusions:  . ceFEPime (MAXIPIME) IV    . lactated ringers    . 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 Neurosurgery plans for surgical debridement and removal of metal hardware. Apixaban held, and pharmacy asked to transition to IV heparin infusion.  Last dose of Apixaban 5mg  PO BID on 07/24/20 at 2100  Earlier today prior to removal of spinal fusion hardware and debridement of decubitus wound with primary closure - was therapeutic on heparin infusion at 950 units/hr. Hgb 9.3, plt 309.  Discussed with Dr  07/26/20 - no plan for further procedures, okay to resume PTA apixaban tonight.    Goal of Therapy:  Monitor platelets by anticoagulation protocol: Yes   Plan:  -Restart apixaban 5 mg BID tonight  -Monitor CBC, and for s/sx of bleeding   [id number], PharmD, BCCCP Clinical Pharmacist  Phone: [phone] 07/27/2020 8:51 PM  Please check AMION for all [facility] phone numbers After 10:00 PM, call Main Pharmacy [phone]

## 2020-07-27 NOTE — Progress Notes (Signed)
Called patient's son Malachi Pro with update on his mother as well as room assignment.  Very appreciative of call.

## 2020-07-27 NOTE — Progress Notes (Signed)
Patient ID: [name], female   DOB: [birth year], 79 y.o.   MRN: [id number] Patient has been transferred over to [facility] this afternoon.  The dressing is still in her back.  She has been n.p.o. and her heparin has been stopped.  I discussed again with her the nature of the debridement to remove hardware to allow this wound to close.  She is ready and accepting of surgery.  We will proceed.

## 2020-07-27 NOTE — Progress Notes (Signed)
Patient continues to complain of pain unrelieved by current prn regimen ordered at this time. NP was notified last night new orders for nausea placed on chart (see MAR) and continue present plan of care.

## 2020-07-27 NOTE — Anesthesia Postprocedure Evaluation (Signed)
Anesthesia Post Note  Patient: [name]  Procedure(s) Performed: Lumbar Hardware removal with lumbar wound debridement (N/A ) LUMBAR WOUND DEBRIDEMENT (N/A )     Patient location during evaluation: PACU Anesthesia Type: General Level of consciousness: awake and alert Pain management: pain level controlled Vital Signs Assessment: post-procedure vital signs reviewed and stable Respiratory status: spontaneous breathing, nonlabored ventilation, respiratory function stable and patient connected to nasal cannula oxygen Cardiovascular status: blood pressure returned to baseline and stable Postop Assessment: no apparent nausea or vomiting Anesthetic complications: no   No complications documented.  Last Vitals:  Vitals:   07/27/20 2030 07/27/20 2100  BP: 114/63 (!) 91/54  Pulse: 100 100  Resp: 13 15  Temp: 36.7 C (!) 36.4 C  SpO2: 94% 92%    Last Pain:  Vitals:   07/27/20 2100  TempSrc: Oral  PainSc:                  Davene Jobin COKER

## 2020-07-27 NOTE — Anesthesia Procedure Notes (Signed)
Procedure Name: Intubation Date/Time: 07/27/2020 5:16 PM Performed by: Trinna Post., CRNA Pre-anesthesia Checklist: Patient identified, Emergency Drugs available, Suction available, Patient being monitored and Timeout performed Patient Re-evaluated:Patient Re-evaluated prior to induction Oxygen Delivery Method: Circle system utilized Preoxygenation: Pre-oxygenation with 100% oxygen Induction Type: IV induction Ventilation: Mask ventilation without difficulty Laryngoscope Size: Mac and 3 Grade View: Grade I Tube type: Oral Tube size: 7.0 mm Number of attempts: 1 Airway Equipment and Method: Stylet Placement Confirmation: positive ETCO2,  ETT inserted through vocal cords under direct vision and breath sounds checked- equal and bilateral Secured at: 22 cm Tube secured with: Tape Dental Injury: Teeth and Oropharynx as per pre-operative assessment

## 2020-07-27 NOTE — Transfer of Care (Signed)
Immediate Anesthesia Transfer of Care Note  Patient: [name]  Procedure(s) Performed: Lumbar Hardware removal with lumbar wound debridement (N/A ) LUMBAR WOUND DEBRIDEMENT (N/A )  Patient Location: PACU  Anesthesia Type:General  Level of Consciousness: drowsy, patient cooperative and responds to stimulation  Airway & Oxygen Therapy: Patient Spontanous Breathing and Patient connected to nasal cannula oxygen  Post-op Assessment: Report given to RN and Post -op Vital signs reviewed and stable  Post vital signs: Reviewed and stable  Last Vitals:  Vitals Value Taken Time  BP 126/95 07/27/20 1936  Temp    Pulse 101 07/27/20 1938  Resp 11 07/27/20 1938  SpO2 98 % 07/27/20 1938  Vitals shown include unvalidated device data.  Last Pain:  Vitals:   07/27/20 1041  TempSrc:   PainSc: 0-No pain      Patients Stated Pain Goal: 0 (07/27/20 1000)  Complications: No complications documented.

## 2020-07-27 NOTE — Anesthesia Preprocedure Evaluation (Addendum)
Anesthesia Evaluation  Patient identified by MRN, date of birth, ID band Patient awake    Reviewed: Allergy & Precautions, NPO status , Patient's Chart, lab work & pertinent test results  Airway Mallampati: II  TM Distance: >3 FB     Dental  (+) Dental Advisory Given   Pulmonary asthma , sleep apnea , former smoker,    breath sounds clear to auscultation       Cardiovascular hypertension, Pt. on medications + Past MI  + dysrhythmias  Rhythm:Regular Rate:Normal     Neuro/Psych PSYCHIATRIC DISORDERS Depression Dementia  Neuromuscular disease CVA    GI/Hepatic negative GI ROS, Neg liver ROS,   Endo/Other  negative endocrine ROS  Renal/GU Renal disease     Musculoskeletal  (+) Arthritis ,   Abdominal   Peds  Hematology  (+) anemia ,   Anesthesia Other Findings   Reproductive/Obstetrics                            Lab Results  Component Value Date   WBC 11.3 (H) 07/27/2020   HGB 9.3 (L) 07/27/2020   HCT 30.8 (L) 07/27/2020   MCV 94.2 07/27/2020   PLT 309 07/27/2020   Lab Results  Component Value Date   CREATININE 0.34 (L) 07/27/2020   BUN 7 (L) 07/27/2020   NA 140 07/27/2020   K 3.3 (L) 07/27/2020   CL 105 07/27/2020   CO2 26 07/27/2020    Anesthesia Physical Anesthesia Plan  ASA: III  Anesthesia Plan: General   Post-op Pain Management:    Induction: Intravenous  PONV Risk Score and Plan: 3 and Dexamethasone, Ondansetron and Treatment may vary due to age or medical condition  Airway Management Planned: Oral ETT  Additional Equipment: None  Intra-op Plan:   Post-operative Plan: Extubation in OR  Informed Consent: I have reviewed the patients History and Physical, chart, labs and discussed the procedure including the risks, benefits and alternatives for the proposed anesthesia with the patient or authorized representative who has indicated his/her understanding and  acceptance.     Dental advisory given  Plan Discussed with: CRNA  Anesthesia Plan Comments:         Anesthesia Quick Evaluation

## 2020-07-27 NOTE — Progress Notes (Addendum)
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 Patient was admitted with sepsis secondary to unstageable decubitus ulcer of distal thoracic spine with exposed metal hardware.  In the ED patient was mildly tachycardic with elevated lactate and WBC at 11.6.  CT scan of the abdomen showed possible DVT of the right common femoral vein with large colonic stool burden.  CT lumbar spine showed skin breakdown at the fusion of the visible hardware but no evidence of soft tissue abscess.  Patient was started on vancomycin and cefepime and blood cultures were obtained.  She was then admitted to the hospital for further evaluation and treatment. Per chart review after last hospitalization patient was discharged home with hospice.  Patient's hospital course omplicated by exposed metal hardware.  Initially was treated on empiric vancomycin and cefepime due to concern for sepsis (tachycardia, leukocytosis, lactic acidosis of 3, without fever).  ID evaluated and given no overt signs of infection of sacral ulcer recommended discontinuation of antibiotics with close  monitoring.  Neurosurgery also evaluated and recommends surgical debridement which family is agreeable to.   Assessment/Plan:  Active Problems:   Right leg swelling   Depression   History of DVT (deep vein thrombosis)   Dementia (HCC)   Benign essential HTN   Pulmonary embolism (HCC)   Transaminitis   Severe sepsis (HCC)   Hypocalcemia   Osteomyelitis (HCC)   Hardware complicating wound infection (HCC)   Back pain   Pressure injury, unstageable (HCC)  Severesepsis secondaryUnstageable ulcer of thedistal thoracic/early lumbarspine( present on admission) with exposed hardware consistent with osteomyelitis.    Sepsis physiology has improved.  Currently off antibiotic as per ID.  WBC at 11.3.  Blood culture with no growth in 5 days.   Vancomycin and cefepime was discontinued on 12/12 as per ID recommendation. Neurosurgery on board who has discussed with family regarding surgical debridement and removal of metal hardware.  Eliquis on hold.  IV heparin in the interim.  Surgical plan as per neurosurgery.  Awaiting for transfer to [facility]  Exposed hardware from surgical decompression and arthrodesis from L1-S1 on 03/31/2006 secondary to symptomatic spondylitic stenosis with idiopathic scoliosis.  Per note on 06/19/20, patient has had decubitus ulcer with exposed hardware being treated at home with wet to dry dressings and santyl.  Recently noted increase odorous drainage which prompted evaluation at wound care center who recommended she be evaluated in ED which led to this admission.  Neurosurgery on board for possible debridement.  History of submassivebilateral saddlePEand right and left DVT ( 9/20201). Patient has IVC filter and has been adherent with Eliquis per family. CT abdomen imaging concerning for worsening edema that may be related to new clot burden with right common femoral vein.  Venous duplex also shows proximal right  common femoral vein DVT of indeterminate age.  Currently on IV heparin drip.  Elevated LFTs. Likely secondary to sepsis, improved.  .Incidental finding of bladder wall thickening. UA unremarkable  Constipation.  continue scheduled stool softeners and MiraLAX   Left upper extremity swelling in setting of hematoma from prior hospitalization secondary to TPA. Continue extremity elevation.  On IV heparin.  Monitor.  Chronic anemia, normocytic, hemoglobin stable at baseline 8-9.  Hemoglobin of 9.0 today.  Monitor closely while on heparin.  Hypertension, on IV hydralazine. On amlodipine, Lasix and lisinopril at home.  Will resume amlodipine due to borderline elevated blood pressure.  Dementia with behavior disturbances, stable. 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 -Continue BuSpar, Aricept, Namenda, delirium precautions  History of stroke ( 02/2020). Had loop recorder placed at that time. no new focal deficits  Pressure Injury 07/22/20 Sacrum Mid Stage 1 -  Intact skin with non-blanchable redness of a localized area usually over a bony prominence. old, closed pressure injury coccyx. (Active)  07/22/20 1535  Location: Sacrum  Location Orientation: Mid  Staging: Stage 1 -  Intact skin with non-blanchable redness of a localized area usually over a bony prominence.  Wound Description (Comments): old, closed pressure injury coccyx.  Present on Admission: Yes     DVT prophylaxis: Heparin drip  Code Status: Partial code.  Okay with CPR but no intubation  Family Communication: I tried to call the patient's son Mr. Sherilyn Dacosta on the phone but was unable to reach him today.  Status is: Inpatient  Remains inpatient appropriate because:Unsafe d/c plan, IV treatments appropriate due to intensity of illness or inability to take PO, Inpatient level of care appropriate due to severity of illness and Possible need for surgical debridement   Dispo: The patient is from: Home              Anticipated d/c is  to: Likely to skilled nursing facility.              Anticipated d/c date is: > 3 days              Patient currently is not medically stable to d/c.   Consultants:  Neurosurgery  Infectious disease  Wound care  Procedures:  Venous duplex  Antibiotics:  . None currently  Anti-infectives (From admission, onward)   Start     Dose/Rate Route Frequency Ordered Stop   07/23/20 1600  metroNIDAZOLE (FLAGYL) tablet 500 mg  Status:  Discontinued        500 mg Oral Every 8 hours 07/23/20 1433 07/23/20 1613   07/22/20 1830  vancomycin (VANCOREADY) IVPB 750 mg/150 mL  Status:  Discontinued        750 mg 150 mL/hr over 60 Minutes Intravenous Every 24 hours 07/21/20 1829 07/23/20 1613   07/22/20 0200  ceFEPIme (MAXIPIME) 2 g in sodium chloride 0.9 % 100 mL IVPB  Status:  Discontinued        2 g 200 mL/hr over 30 Minutes Intravenous Every 12 hours 07/21/20 1803 07/23/20 1613   07/21/20 1900  vancomycin (VANCOREADY) IVPB 500 mg/100 mL        500 mg 100 mL/hr over 60 Minutes Intravenous  Once 07/21/20 1803 07/21/20 2033   07/21/20 1345  ceFEPIme (MAXIPIME) 2 g in sodium chloride 0.9 % 100 mL IVPB        2 g 200 mL/hr over 30 Minutes Intravenous  Once 07/21/20 1331 07/21/20 1415   07/21/20 1330  vancomycin (VANCOCIN) IVPB 1000 mg/200 mL premix        1,000 mg 200 mL/hr over 60 Minutes Intravenous  Once 07/21/20 1328 07/21/20 1520   07/21/20 1330  ceFEPIme (MAXIPIME) 1 g in sodium chloride 0.9 % 100 mL IVPB  Status:  Discontinued        1 g 200 mL/hr over 30 Minutes Intravenous  Once 07/21/20 1328 07/21/20 1331     Subjective: Today, patient was seen and examined at bedside.  Complains of generalized body pain.  Objective: Vitals:   07/26/20 2031 07/27/20 0453  BP: (!) 148/74 (!) 153/90  Pulse: 84 84  Resp: 16 18  Temp: 98.1 F (36.7 C) 97.9 F (36.6 C)  SpO2: 99% 100%    Intake/Output Summary (Last 24 hours) at 07/27/2020 0828 Last data filed at 07/27/2020 0300 Gross per  24 hour  Intake 676.18 ml  Output 1150 ml  Net -473.82 ml   Filed Weights   07/25/20 0520 07/26/20 0500 07/27/20 0459  Weight: 80.7 kg 80.3 kg 79.5 kg   Body mass index is 32.06 kg/m.   Physical Exam:  GENERAL: Patient is alert awake and oriented, communicative, in mild distress due to pain obese HENT: No scleral pallor or icterus. Pupils equally reactive to light. Oral mucosa is moist NECK: is supple, no gross swelling noted. CHEST: Clear to auscultation. No crackles or wheezes.  Diminished breath sounds bilaterally. CVS: S1 and S2 heard, no murmur. Regular rate and rhythm.  ABDOMEN: Soft, non-tender, bowel sounds are present. EXTREMITIES: Pitting edema bilateral lower extremities, left upper extremity edema CNS: Cranial nerves are intact. No focal motor deficits. SKIN: Decubitus ulceration present on admission.    Data Review: I have personally reviewed the following laboratory data and studies,  CBC: Recent Labs  Lab 07/21/20 1340 07/22/20 0435 07/23/20 0458 07/24/20 0418 07/25/20 0400 07/26/20 0103 07/27/20 0429  WBC 11.1*   < > 11.9* 10.2 11.1* 11.4* 11.3*  NEUTROABS 9.0*  --   --   --   --   --   --   HGB 12.3   < > 9.3* 9.2* 8.6* 9.0* 9.3*  HCT 40.5   < > 31.8* 31.3* 29.5* 30.7* 30.8*  MCV 94.4   < > 96.4 95.4 95.8 95.9 94.2  PLT 386   < > 336 343 327 332 309   < > = values in this interval not displayed.   Basic Metabolic Panel: Recent Labs  Lab 07/22/20 0435 07/23/20 0458 07/23/20 1712 07/24/20 0418 07/25/20 0400 07/26/20 0103 07/27/20 0429  NA 144 143 141 144 144 142 140  K 3.7 3.1* 3.4* 3.7 3.7 3.2* 3.3*  CL 109 109 110 109 110 108 105  CO2 25 23 23 24 25 26 26   GLUCOSE 93 86 120* 110* 112* 115* 91  BUN 11 8 9 9 9 9  7*  CREATININE 0.54 0.40* 0.43* 0.45 0.43* 0.41* 0.34*  CALCIUM 8.9 8.6* 8.4* 8.8* 8.9 8.7* 8.8*  MG 2.2 2.0  --  2.1  --   --   --    Liver Function Tests: Recent Labs  Lab 07/23/20 0458 07/24/20 0418 07/25/20 0400  07/26/20 0103 07/27/20 0429  AST 38 41 38 37 26  ALT 55* 54* 48* 44 34  ALKPHOS 56 55 53 60 64  BILITOT 0.8 0.6 0.5 0.5 0.9  PROT 6.3* 6.2* 6.2* 6.2* 6.5  ALBUMIN 2.0* 2.0* 2.1* 2.1* 2.2*   No results for input(s): LIPASE, AMYLASE in the last 168  hours. No results for input(s): AMMONIA in the last 168 hours. Cardiac Enzymes: No results for input(s): CKTOTAL, CKMB, CKMBINDEX, TROPONINI in the last 168 hours. BNP (last 3 results) Recent Labs    09/16/19 1410 05/06/20 0123  BNP 91.2 175.9*    ProBNP (last 3 results) No results for input(s): PROBNP in the last 8760 hours.  CBG: No results for input(s): GLUCAP in the last 168 hours. Recent Results (from the past 240 hour(s))  Aerobic Culture (superficial specimen)     Status: None   Collection Time: 07/21/20 12:00 PM   Specimen: Wound  Result Value Ref Range Status   Specimen Description   Final    WOUND BACK Performed at [facility], [address], [place], [place] [address]    Special Requests   Final    NONE Performed at [facility], [address], [place], [place] [address]    Gram Stain   Final    ABUNDANT WBC PRESENT,BOTH PMN AND MONONUCLEAR ABUNDANT GRAM NEGATIVE RODS MODERATE GRAM POSITIVE COCCI Performed at [facility], [address] [place], [place] [address]    Culture   Final    FEW ESCHERICHIA COLI ABUNDANT STREPTOCOCCUS ANGINOSIS    Report Status 07/26/2020 FINAL  Final   Organism ID, Bacteria ESCHERICHIA COLI  Final   Organism ID, Bacteria STREPTOCOCCUS ANGINOSIS  Final      Susceptibility   Escherichia coli - MIC*    AMPICILLIN <=2 SENSITIVE Sensitive     CEFAZOLIN <=4 SENSITIVE Sensitive     CEFEPIME <=0.12 SENSITIVE Sensitive     CEFTAZIDIME <=1 SENSITIVE Sensitive     CEFTRIAXONE <=0.25 SENSITIVE Sensitive     CIPROFLOXACIN <=0.25 SENSITIVE Sensitive     GENTAMICIN <=1 SENSITIVE Sensitive     IMIPENEM <=0.25 SENSITIVE Sensitive      TRIMETH/SULFA <=20 SENSITIVE Sensitive     AMPICILLIN/SULBACTAM <=2 SENSITIVE Sensitive     PIP/TAZO <=4 SENSITIVE Sensitive     * FEW ESCHERICHIA COLI   Streptococcus anginosis - MIC*    PENICILLIN <=0.06 SENSITIVE Sensitive     CEFTRIAXONE 0.25 SENSITIVE Sensitive     ERYTHROMYCIN <=0.12 SENSITIVE Sensitive     LEVOFLOXACIN 0.5 SENSITIVE Sensitive     VANCOMYCIN 0.5 SENSITIVE Sensitive     * ABUNDANT STREPTOCOCCUS ANGINOSIS  Blood culture (routine x 2)     Status: None   Collection Time: 07/21/20  1:40 PM   Specimen: BLOOD RIGHT WRIST  Result Value Ref Range Status   Specimen Description   Final    BLOOD RIGHT WRIST Performed at [facility], [address], [place], [place] [address]    Special Requests   Final    BOTTLES DRAWN AEROBIC AND ANAEROBIC Blood Culture adequate volume Performed at [facility], [address] [place], [place] [address]    Culture   Final    NO GROWTH 5 DAYS Performed at [facility], [address], [place], [place] [address]    Report Status 07/26/2020 FINAL  Final  Blood culture (routine x 2)     Status: None   Collection Time: 07/21/20  1:52 PM   Specimen: BLOOD  Result Value Ref Range Status   Specimen Description   Final    BLOOD RIGHT ANTECUBITAL Performed at [facility], [address] [place], [place] [address]    Special Requests   Final    BOTTLES DRAWN AEROBIC AND ANAEROBIC Blood Culture  results may not be optimal due to an inadequate volume of blood received in culture bottles Performed at [facility], [address], [place], [place] [address]    Culture   Final    NO GROWTH 5 DAYS Performed at [facility], [address], [place], [place] [address]    Report Status 07/26/2020 FINAL  Final  Resp Panel by RT-PCR (Flu A&B, Covid) Nasopharyngeal Swab     Status: None   Collection Time: 07/21/20  1:57 PM   Specimen: Nasopharyngeal Swab;  Nasopharyngeal(NP) swabs in vial transport medium  Result Value Ref Range Status   SARS Coronavirus 2 by RT PCR NEGATIVE NEGATIVE Final    Comment: (NOTE) SARS-CoV-2 target nucleic acids are NOT DETECTED.  The SARS-CoV-2 RNA is generally detectable in upper respiratory specimens during the acute phase of infection. The lowest concentration of SARS-CoV-2 viral copies this assay can detect is 138 copies/mL. A negative result does not preclude SARS-Cov-2 infection and should not be used as the sole basis for treatment or other patient management decisions. A negative result may occur with  improper specimen collection/handling, submission of specimen other than nasopharyngeal swab, presence of viral mutation(s) within the areas targeted by this assay, and inadequate number of viral copies(<138 copies/mL). A negative result must be combined with clinical observations, patient history, and epidemiological information. The expected result is Negative.  Fact Sheet for Patients:  BloggerCourse.com  Fact Sheet for Healthcare Providers:  SeriousBroker.it  This test is no t yet approved or cleared by the [place] FDA and  has been authorized for detection and/or diagnosis of SARS-CoV-2 by FDA under an Emergency Use Authorization (EUA). This EUA will remain  in effect (meaning this test can be used) for the duration of the COVID-19 declaration under Section 564(b)(1) of the Act, 21 U.S.C.section 360bbb-3(b)(1), unless the authorization is terminated  or revoked sooner.       Influenza A by PCR NEGATIVE NEGATIVE Final   Influenza B by PCR NEGATIVE NEGATIVE Final    Comment: (NOTE) The Xpert Xpress SARS-CoV-2/FLU/RSV plus assay is intended as an aid in the diagnosis of influenza from Nasopharyngeal swab specimens and should not be used as a sole basis for treatment. Nasal washings and aspirates are unacceptable for Xpert Xpress  SARS-CoV-2/FLU/RSV testing.  Fact Sheet for Patients: BloggerCourse.com  Fact Sheet for Healthcare Providers: SeriousBroker.it  This test is not yet approved or cleared by the [place] FDA and has been authorized for detection and/or diagnosis of SARS-CoV-2 by FDA under an Emergency Use Authorization (EUA). This EUA will remain in effect (meaning this test can be used) for the duration of the COVID-19 declaration under Section 564(b)(1) of the Act, 21 U.S.C. section 360bbb-3(b)(1), unless the authorization is terminated or revoked.  Performed at [facility], [address], [place], [place] [address]      Studies: No results found.    Joycelyn Das, MD  [facility] 07/27/2020  If 7PM-7AM, please contact night-coverage

## 2020-07-28 ENCOUNTER — Encounter (HOSPITAL_COMMUNITY): Payer: Self-pay | Admitting: Neurological Surgery

## 2020-07-28 ENCOUNTER — Other Ambulatory Visit: Payer: Medicare Other | Admitting: Internal Medicine

## 2020-07-28 ENCOUNTER — Inpatient Hospital Stay: Payer: Self-pay

## 2020-07-28 DIAGNOSIS — Y792 Prosthetic and other implants, materials and accessory orthopedic devices associated with adverse incidents: Secondary | ICD-10-CM

## 2020-07-28 DIAGNOSIS — T847XXD Infection and inflammatory reaction due to other internal orthopedic prosthetic devices, implants and grafts, subsequent encounter: Secondary | ICD-10-CM

## 2020-07-28 DIAGNOSIS — M869 Osteomyelitis, unspecified: Secondary | ICD-10-CM

## 2020-07-28 LAB — CBC WITH DIFFERENTIAL/PLATELET
Abs Immature Granulocytes: 0.08 10*3/uL — ABNORMAL HIGH (ref 0.00–0.07)
Basophils Absolute: 0 10*3/uL (ref 0.0–0.1)
Basophils Relative: 0 %
Eosinophils Absolute: 0 10*3/uL (ref 0.0–0.5)
Eosinophils Relative: 0 %
HCT: 32.3 % — ABNORMAL LOW (ref 36.0–46.0)
Hemoglobin: 9.5 g/dL — ABNORMAL LOW (ref 12.0–15.0)
Immature Granulocytes: 0 %
Lymphocytes Relative: 7 %
Lymphs Abs: 1.4 10*3/uL (ref 0.7–4.0)
MCH: 28.2 pg (ref 26.0–34.0)
MCHC: 29.4 g/dL — ABNORMAL LOW (ref 30.0–36.0)
MCV: 95.8 fL (ref 80.0–100.0)
Monocytes Absolute: 1.3 10*3/uL — ABNORMAL HIGH (ref 0.1–1.0)
Monocytes Relative: 7 %
Neutro Abs: 16.3 10*3/uL — ABNORMAL HIGH (ref 1.7–7.7)
Neutrophils Relative %: 86 %
Platelets: 311 10*3/uL (ref 150–400)
RBC: 3.37 MIL/uL — ABNORMAL LOW (ref 3.87–5.11)
RDW: 17.1 % — ABNORMAL HIGH (ref 11.5–15.5)
WBC: 19.2 10*3/uL — ABNORMAL HIGH (ref 4.0–10.5)
nRBC: 0.2 % (ref 0.0–0.2)

## 2020-07-28 LAB — COMPREHENSIVE METABOLIC PANEL
ALT: 29 U/L (ref 0–44)
AST: 23 U/L (ref 15–41)
Albumin: 2 g/dL — ABNORMAL LOW (ref 3.5–5.0)
Alkaline Phosphatase: 63 U/L (ref 38–126)
Anion gap: 12 (ref 5–15)
BUN: 6 mg/dL — ABNORMAL LOW (ref 8–23)
CO2: 24 mmol/L (ref 22–32)
Calcium: 8.9 mg/dL (ref 8.9–10.3)
Chloride: 104 mmol/L (ref 98–111)
Creatinine, Ser: 0.43 mg/dL — ABNORMAL LOW (ref 0.44–1.00)
GFR, Estimated: >60 mL/min (ref >60)
Glucose, Bld: 113 mg/dL — ABNORMAL HIGH (ref 70–99)
Potassium: 3.9 mmol/L (ref 3.5–5.1)
Sodium: 140 mmol/L (ref 135–145)
Total Bilirubin: 1.1 mg/dL (ref 0.3–1.2)
Total Protein: 6.4 g/dL — ABNORMAL LOW (ref 6.5–8.1)

## 2020-07-28 LAB — MAGNESIUM: Magnesium: 1.9 mg/dL (ref 1.7–2.4)

## 2020-07-28 MED ORDER — CEFAZOLIN SODIUM-DEXTROSE 2-4 GM/100ML-% IV SOLN
2.0000 g | Freq: Three times a day (TID) | INTRAVENOUS | Status: DC
  Administered 2020-07-28 – 2020-08-07 (×31): 2 g via INTRAVENOUS
  Filled 2020-07-28 (×33): qty 100

## 2020-07-28 MED ORDER — CHLORHEXIDINE GLUCONATE CLOTH 2 % EX PADS
6.0000 | MEDICATED_PAD | Freq: Every day | CUTANEOUS | Status: DC
  Administered 2020-07-29 – 2020-08-07 (×10): 6 via TOPICAL

## 2020-07-28 MED ORDER — PROSOURCE PLUS PO LIQD
30.0000 mL | Freq: Two times a day (BID) | ORAL | Status: DC
Start: 2020-07-29 — End: 2020-07-28

## 2020-07-28 MED ORDER — SODIUM CHLORIDE 0.9% FLUSH
10.0000 mL | Freq: Two times a day (BID) | INTRAVENOUS | Status: DC
  Administered 2020-07-28 – 2020-08-07 (×15): 10 mL

## 2020-07-28 MED ORDER — PROSOURCE PLUS PO LIQD
30.0000 mL | Freq: Two times a day (BID) | ORAL | Status: DC
  Administered 2020-07-28 – 2020-08-07 (×14): 30 mL via ORAL
  Filled 2020-07-28 (×16): qty 30

## 2020-07-28 MED ORDER — ENSURE ENLIVE PO LIQD
237.0000 mL | Freq: Four times a day (QID) | ORAL | Status: DC
  Administered 2020-07-28 – 2020-08-07 (×15): 237 mL via ORAL

## 2020-07-28 MED ORDER — POLYETHYLENE GLYCOL 3350 17 G PO PACK
17.0000 g | PACK | Freq: Every day | ORAL | Status: DC
  Administered 2020-07-29 – 2020-08-07 (×8): 17 g via ORAL
  Filled 2020-07-28 (×10): qty 1

## 2020-07-28 MED ORDER — SODIUM CHLORIDE 0.9% FLUSH
10.0000 mL | INTRAVENOUS | Status: DC | PRN
Start: 2020-07-28 — End: 2020-08-07

## 2020-07-28 MED ORDER — JUVEN PO PACK
1.0000 | PACK | Freq: Two times a day (BID) | ORAL | Status: DC
  Administered 2020-07-28 – 2020-08-07 (×14): 1 via ORAL
  Filled 2020-07-28 (×16): qty 1

## 2020-07-28 MED ORDER — ADULT MULTIVITAMIN W/MINERALS CH
1.0000 | ORAL_TABLET | Freq: Every day | ORAL | Status: DC
  Administered 2020-07-28 – 2020-08-07 (×9): 1 via ORAL
  Filled 2020-07-28 (×11): qty 1

## 2020-07-28 NOTE — Evaluation (Signed)
Occupational Therapy Evaluation Patient Details Name: [name] MRN: [id number] DOB: 1940-10-10 Today's Date: 07/28/2020    History of Present Illness [name] is an 79 y.o. 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 Pt also noted with exposed hardward. Pt underwent I&D of wound and removal of spinal fusion hardware on 12/16.   Clinical Impression   PTA, pt reports living with granddaughter who works during the day. Pt reports ambulatory with cane or walker with light assist for ADLs. Unsure of PLOF accuracy as pt unreliable historian. Pt received in bed, pleasant with focused gaze throughout session. 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 Pt overall Total A x 2 for all bed mobility, Max A to maintain sitting balance EOB with L lateral lean. Pt requires Total A for UB and LB ADLs. Pt requires Moderate assist for self feeding and grooming tasks at bed level. Pt with reports of pain in various locations with movement and unable to progress OOB today. Pt will require mechanical lift for OOB transfers. Recommend SNF for short term rehab at discharge. Plan to further address self feeding, simple grooming tasks and sitting balance during future sessions. VSS on RA.     Follow Up Recommendations  SNF;Supervision/Assistance - 24 hour    Equipment Recommendations  Wheelchair (measurements OT);Wheelchair cushion (measurements OT);Hospital bed;Other (comment) Michiel Sites lift)    Recommendations for Other Services       Precautions / Restrictions Precautions Precautions:  Fall;Back;Other (comment) Precaution Booklet Issued: Yes (comment) Precaution Comments: L hemiparesis, drain Restrictions Weight Bearing Restrictions: No      Mobility Bed Mobility Overal bed mobility: Needs Assistance Bed Mobility: Rolling;Sidelying to Sit;Sit to Sidelying Rolling: Total assist Sidelying to sit: Total assist;+2 for physical assistance;+2 for safety/equipment;HOB elevated     Sit to sidelying: Total assist;+2 for physical assistance;+2 for safety/equipment General bed mobility comments: Total A for rolling to side for wound care RN assessment. Total A x 2 for getting to EOB, able to move L LE to assist but unable to actively move R LE to EOB. Heavy assist for trunk    Transfers                 General transfer comment: deferred, will require lift for OOB transfer    Balance Overall balance assessment: Needs assistance Sitting-balance support: Bilateral upper extremity supported;Feet supported Sitting balance-Leahy Scale: Zero Sitting balance - Comments: Max A to maintain sitting balance with L lateral lean Postural control: Left lateral lean                                 ADL either performed or assessed with clinical judgement   ADL Overall ADL's : Needs assistance/impaired Eating/Feeding: Moderate assistance;Sitting   Grooming: Moderate assistance;Bed level   Upper Body Bathing: Total assistance;Bed level   Lower Body Bathing: Total assistance;Bed level   Upper Body Dressing : Total assistance;Bed level   Lower Body Dressing: Total assistance;Bed level Lower Body Dressing Details (indicate cue type and reason): Total A for donning socks in bed,  unable to lift LEs for task. Pain with movement.     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 Extensive assist at bed level     Vision Patient Visual Report: No change from baseline Vision Assessment?: Vision impaired- to be further tested in functional context Additional Comments: Pt with focused gaze throughout session, tendency for L sided gaze. With cues and increased time, pt able to scan and locate therapist's face. To be further assessed     Perception     Praxis      Pertinent Vitals/Pain Pain Assessment: Faces Faces Pain Scale: Hurts even more Pain Location: back, feet, L elbow with movement Pain Descriptors / Indicators: Discomfort;Grimacing;Guarding Pain Intervention(s): Monitored during session;Patient requesting pain meds-RN notified;Repositioned;Limited activity within patient's tolerance     Hand Dominance Right   Extremity/Trunk Assessment Upper Extremity Assessment Upper Extremity Assessment: RUE deficits/detail;LUE deficits/detail RUE Deficits / Details: Weak, able to lift arm enough to clear lines from under pt. 3-/5 RUE Coordination: decreased gross motor;decreased fine motor LUE Deficits / Details: Hx of CVA, Able to wiggle fingers, unable to make full fist. No active wrist, elbow or shoulder movement on commands, PROM of wrist/elbow [facility] but painful LUE Sensation: decreased light touch;decreased proprioception LUE Coordination: decreased fine motor;decreased gross motor   Lower Extremity Assessment Lower Extremity Assessment: Defer to PT evaluation   Cervical / Trunk Assessment Cervical / Trunk Assessment: Kyphotic   Communication Communication Communication: No difficulties   Cognition Arousal/Alertness: Awake/alert;Lethargic (initially lethargic) Behavior During Therapy: Flat affect Overall Cognitive Status: No family/caregiver present to determine baseline cognitive functioning Area of Impairment: Orientation;Memory;Following commands;Safety/judgement;Awareness;Problem solving                 Orientation Level:  Situation;Time   Memory: Decreased short-term memory Following Commands: Follows one step commands with increased time Safety/Judgement: Decreased awareness of safety;Decreased awareness of deficits Awareness: Intellectual Problem Solving: Slow processing;Difficulty sequencing;Decreased initiation;Requires verbal cues;Requires tactile cues General Comments: Pt with flat affect, focused gaze throughout session. Pt with some memory deficits, unaware of deficits, slow processing with following of commands. focused on pain with movement   General Comments  Assisted in repositioning pt on L side to improve comfort/maintain skin integrity. RN present at start of eval    Exercises     Shoulder Instructions      Home Living Family/patient expects to be discharged to:: Private residence Living Arrangements: Other relatives (granddaughter) Available Help at Discharge: Family;Available PRN/intermittently (family works) Type of Home: House Home Access: Level entry     Home Layout: One level     Bathroom Shower/Tub: Producer, television/film/video: Standard Bathroom Accessibility: Yes   Home Equipment: Environmental consultant - 2 wheels;Wheelchair - [employer];Shower seat   Additional Comments: Home setup from previous admission, pt confirmed info but unsure of accuracy of equipment      Prior Functioning/Environment Level of Independence: Needs assistance  Gait / Transfers Assistance Needed: Reports walking with cane or walker PTA but when asked when the last time pt had walked, she reports "I dont know". ADL's / Homemaking Assistance Needed: Pt reports able to walk to bathroom, complete ADLs with light assist. Granddaughter completes IADLs.   Comments: Unsure of accuracy of PLOF as pt with hx of CVA July 2021, extensive weakness at this time  OT Problem List: Decreased strength;Decreased range of motion;Decreased activity tolerance;Impaired balance (sitting and/or  standing);Impaired vision/perception;Decreased coordination;Decreased cognition;Decreased safety awareness;Decreased knowledge of use of DME or AE;Decreased knowledge of precautions;Impaired tone;Impaired UE functional use;Pain      OT Treatment/Interventions: Self-care/ADL training;Therapeutic exercise;DME and/or AE instruction;Manual therapy;Energy conservation;Therapeutic activities;Patient/family education;Balance training    OT Goals(Current goals can be found in the care plan section) Acute Rehab OT Goals Patient Stated Goal: pain control, have some ginger ale OT Goal Formulation: With patient Time For Goal Achievement: 07/28/20 Potential to Achieve Goals: Fair ADL Goals Pt Will Perform Eating: with set-up;sitting Pt Will Perform Grooming: with set-up;sitting Pt Will Perform Upper Body Bathing: with min assist;sitting Pt Will Perform Upper Body Dressing: with min assist;sitting Additional ADL Goal #1: Pt to demonstrate ability to sit EOB >3 with no more than MIn A for sitting balance to increase participation during ADLs Additional ADL Goal #2: Pt/caregiver to demonstrate UE HEP to maintain ROM and prevent contracture formation Additional ADL Goal #3: Pt/caregiver to verbalize at least 2 strategies to maintain skin integrity  OT Frequency: Min 2X/week   Barriers to D/C:            Co-evaluation PT/OT/SLP Co-Evaluation/Treatment: Yes Reason for Co-Treatment: Complexity of the patient's impairments (multi-system involvement);Necessary to address cognition/behavior during functional activity;For patient/therapist safety;To address functional/ADL transfers PT goals addressed during session: Mobility/safety with mobility;Balance;Strengthening/ROM OT goals addressed during session: ADL's and self-care      AM-PAC OT "6 Clicks" Daily Activity     Outcome Measure Help from another person eating meals?: A Lot Help from another person taking care of personal grooming?: A Lot Help from  another person toileting, which includes using toliet, bedpan, or urinal?: Total Help from another person bathing (including washing, rinsing, drying)?: Total Help from another person to put on and taking off regular upper body clothing?: Total Help from another person to put on and taking off regular lower body clothing?: Total 6 Click Score: 8   End of Session Nurse Communication: Mobility status;Patient requests pain meds;Need for lift equipment  Activity Tolerance: Patient limited by pain;Patient limited by fatigue;Patient limited by lethargy Patient left: in bed;with call bell/phone within reach;with bed alarm set;with SCD's reapplied  OT Visit Diagnosis: Unsteadiness on feet (R26.81);Other abnormalities of gait and mobility (R26.89);Muscle weakness (generalized) (M62.81);Other symptoms and signs involving cognitive function;Pain;Hemiplegia and hemiparesis Hemiplegia - Right/Left: Left Hemiplegia - dominant/non-dominant: Non-Dominant Hemiplegia - caused by: Cerebral infarction Pain - Right/Left:  (both) Pain - part of body: Leg;Knee;Arm (back, B feet/LE and L UE)                Time: 6644-0347 OT Time Calculation (min): 32 min Charges:  OT General Charges $OT Visit: 1 Visit OT Evaluation $OT Eval Moderate Complexity: 1 Mod  Lorre Munroe, OTR/L  Lorre Munroe 07/28/2020, 10:38 AM

## 2020-07-28 NOTE — Consult Note (Signed)
WOC Nurse Consult Note: Patient receiving care in [facility] [id number].  Primary RN, Miranda, and I discussed the WOC consult order. Reason for Consult: "Follow-up on debridement of wound closure of lumbar decubitus after removal of hardware to prevent recurrence of pressure ulcer". Wound type: Lumbar area has a close incision; staples are visible to the suture line. The number one way of preventing pressure injuries is to avoid pressure to the area.  The patient states she is on an "air" mattress.  I do not know if this is a low air loss mattress, or a static device.  The patient needs to consume adequate calories, protein, and remain hydrated.  I do not know what her normal nutritional intake consists of.  The patient has dementia at baseline.  Orders entered today: turning instructions to AVOID pressure to the spinal incision site to the greatest extent possible.  Turn right or left.  A nursing instruction order to encourage consumption of high quality protein, calories, and fluids. Thank you for the consult.  Discussed plan of care with the patient and bedside nurse.  WOC nurse will not follow at this time.  Please re-consult the WOC team if needed.  Helmut Muster, RN, MSN, CWOCN, CNS-BC, pager [phone]

## 2020-07-28 NOTE — Progress Notes (Signed)
[facility])  Mr. Sabey is our current palliative care pt in the community.  Per our palliative care NP who sees him in the home, a PMT consult may be beneficial to continue GOC conversations.  [facility] will continue to follow.  Wallis Bamberg RN, BSN, CCRN [facility], In Liaison

## 2020-07-28 NOTE — Progress Notes (Addendum)
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 To discharging provider:  please sign these orders via discharge navigator,  Select New Orders & click on the button choice - Manage This Unsigned Work.     Thank you for allowing pharmacy to be a part of this patient's care.  Sharin Mons, PharmD, BCPS, BCIDP Infectious Diseases Clinical Pharmacist Phone: [phone] 07/28/2020, 9:58 AM

## 2020-07-28 NOTE — Progress Notes (Signed)
Patient ID: [name], female   DOB: 1940/12/20, 79 y.o.   MRN: [id number] Vital signs are stable Dressing changed today and drains were removed as one drain had partially removed itself Patient appears to be tolerating surgery well The surgical site looks clean and dry She has been restarted on her anticoagulation Arrangements can be made for plan discharge soon Family will have to decide whether she comes home or goes to skilled nursing facility.

## 2020-07-28 NOTE — Progress Notes (Signed)
Peripherally Inserted Central Catheter Placement  The IV Nurse has discussed with the patient and/or persons authorized to consent for the patient, the purpose of this procedure and the potential benefits and risks involved with this procedure.  The benefits include less needle sticks, lab draws from the catheter, and the patient may be discharged home with the catheter. Risks include, but not limited to, infection, bleeding, blood clot (thrombus formation), and puncture of an artery; nerve damage and irregular heartbeat and possibility to perform a PICC exchange if needed/ordered by physician.  Alternatives to this procedure were also discussed.  Bard Power PICC patient education guide, fact sheet on infection prevention and patient information card has been provided to patient /or left at bedside.   Telephone consent obtained from son, Alveria Apley and witnessed with Netta Corrigan, RN.  PICC Placement Documentation  PICC Single Lumen 07/28/20 PICC Right Brachial 42 cm 1 cm (Active)  Indication for Insertion or Continuance of Line Home intravenous therapies (PICC only) 07/28/20 2230  Exposed Catheter (cm) 1 cm 07/28/20 2230  Site Assessment Clean;Dry;Intact 07/28/20 2230  Line Status Capped (central line);Flushed;Blood return noted 07/28/20 2230  Dressing Type Transparent;Occlusive 07/28/20 2230  Dressing Status Intact;Dry;Clean 07/28/20 2230  Antimicrobial disc in place? Yes 07/28/20 2230  Safety Lock Not Applicable 07/28/20 2230  Line Care Connections checked and tightened 07/28/20 2230  Line Adjustment (NICU/IV Team Only) No 07/28/20 2230  Dressing Intervention New dressing 07/28/20 2230  Dressing Change Due 08/04/20 07/28/20 2230       Burnard Bunting Chenice 07/28/2020, 10:56 PM

## 2020-07-28 NOTE — Progress Notes (Signed)
Pt arrived to 4[facility] from PACU in bed, vital signs taken, SCDs on and belongings at bedside including meidcare paper, socks and one piece of blue clothing.  Pt denies pain at this time.  Bed in lowest position, pt repositioned and call bell within reach.

## 2020-07-28 NOTE — Evaluation (Signed)
Physical Therapy Evaluation Patient Details Name: [name] MRN: [id number] DOB: 21-Jul-1941 Today's Date: 07/28/2020   History of Present Illness  [name] is an 79 y.o. 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 Pt also noted with exposed hardward. Pt underwent I&D of wound and removal of spinal fusion hardware on 12/16.  Clinical Impression  Pt in bed upon arrival of PT, agreeable to evaluation at this time. The pt is an unreliable historian, but reports she was walking at home prior to admission. The pt now presents with significant limitations in functional mobility, strength, power, stability, activity tolerance, and coordination due to above dx and resulting pain, and will continue to benefit from skilled PT to address these deficits. The pt was able to complete transition to sitting EOB at this time with use of log roll, but requires totalA of 2 to complete all mobility due to significant weakness and pain with all mobility. The pt was able to tolerate sitting with minA of 2 for <5 min prior to asking to return to supine due to significant pain. The pt requires heavy assist of multiple people at this time, and will not be safe to return home without significant DME and arrangement of 24/7 supervision/assist. The pt will continue to benefit from skilled PT acutely and in SNF setting when medically stable for d/c.      Follow Up Recommendations SNF;Supervision/Assistance - 24 hour    Equipment Recommendations   (defer to post acute)    Recommendations for Other Services       Precautions /  Restrictions Precautions Precautions: Fall Precaution Booklet Issued: Yes (comment) Precaution Comments: L-sided weakness from prior CVA Restrictions Weight Bearing Restrictions: No      Mobility  Bed Mobility Overal bed mobility: Needs Assistance Bed Mobility: Rolling;Sidelying to Sit;Sit to Sidelying Rolling: Total assist Sidelying to sit: Total assist;+2 for physical assistance;+2 for safety/equipment;HOB elevated     Sit to sidelying: Total assist;+2 for physical assistance;+2 for safety/equipment General bed mobility comments: Total A for rolling to side for wound care RN assessment. Total A x 2 for getting to EOB, able to move L LE to assist but unable to actively move R LE to EOB. Heavy assist for trunk    Transfers                 General transfer comment: NT today due to significant pain, pt requesting to return to supine. would require lift to achieve OOB      Balance Overall balance assessment: Needs assistance Sitting-balance support: Bilateral upper extremity supported;Feet supported Sitting balance-Leahy Scale: Zero Sitting balance - Comments: Max A to maintain sitting balance with L lateral lean Postural control: Left lateral lean                                   Pertinent Vitals/Pain Pain Assessment: Faces Faces Pain Scale: Hurts even more Pain Location: back, feet, L elbow with movement Pain Descriptors / Indicators: Discomfort;Grimacing;Guarding Pain Intervention(s): Limited activity within patient's tolerance;Monitored during session;Repositioned;Patient requesting pain meds-RN notified    Home  Living Family/patient expects to be discharged to:: Private residence Living Arrangements: Other relatives (granddaughter) Available Help at Discharge: Family;Available PRN/intermittently (family works) Type of Home: House Home Access: Level entry     Home Layout: One level Home Equipment: Environmental consultant - 2 wheels;Wheelchair - [employer];Shower seat Additional Comments: Home setup from previous admission, pt confirmed info but unsure of accuracy of equipment    Prior Function Level of Independence: Needs assistance   Gait / Transfers Assistance Needed: Reports walking with cane or walker PTA but when asked when the last time pt had walked, she reports "I dont know".  ADL's / Homemaking Assistance Needed: Pt reports able to walk to bathroom, complete ADLs with light assist. Granddaughter completes IADLs.  Comments: Unsure of accuracy of PLOF as pt with hx of CVA July 2021, extensive weakness at this time     Hand Dominance   Dominant Hand: Right    Extremity/Trunk Assessment   Upper Extremity Assessment Upper Extremity Assessment: Defer to OT evaluation;Generalized weakness RUE Deficits / Details: Weak, able to lift arm enough to clear lines from under pt. 3-/5 RUE Coordination: decreased gross motor;decreased fine motor LUE Deficits / Details: Hx of CVA, Able to wiggle fingers, unable to make full fist. No active wrist, elbow or shoulder movement on commands, PROM of wrist/elbow [facility] but painful LUE Sensation: decreased light touch;decreased proprioception LUE Coordination: decreased fine motor;decreased gross motor    Lower Extremity Assessment Lower Extremity Assessment: Generalized weakness;RLE deficits/detail;LLE deficits/detail RLE Deficits / Details: pt reports no active movement in RLE at this time due to pain in R knee, moaning in pain with all attempts at ROM to RLE. no active participation from pt RLE: Unable to fully assess due to pain LLE Deficits / Details: pt able to move in gravity minimized position    Cervical / Trunk Assessment Cervical / Trunk Assessment: Other exceptions;Kyphotic Cervical / Trunk Exceptions: s/p spine surgery  Communication   Communication: No difficulties  Cognition Arousal/Alertness: Awake/alert (initially lethargic) Behavior During Therapy: Flat affect Overall  Cognitive Status: No family/caregiver present to determine baseline cognitive functioning Area of Impairment: Orientation;Memory;Following commands;Safety/judgement;Awareness;Problem solving                 Orientation Level: Situation;Time   Memory: Decreased short-term memory Following Commands: Follows one step commands with increased time Safety/Judgement: Decreased awareness of safety;Decreased awareness of deficits Awareness: Intellectual Problem Solving: Slow processing;Difficulty sequencing;Decreased initiation;Requires verbal cues;Requires tactile cues General Comments: Pt with flat affect, focused gaze throughout session. Pt with some memory deficits, unaware of deficits, slow processing with following of commands. focused on pain with movement      General Comments General comments (skin integrity, edema, etc.): Assisted in repositioning pt on L side to improve comfort/maintain skin integrity. RN present at start of eval, wound care RN present during session to evaluate wound    Exercises     Assessment/Plan    PT Assessment Patient needs continued PT services  PT Problem List Decreased strength;Decreased range of motion;Decreased activity tolerance;Decreased balance;Decreased mobility;Decreased coordination;Decreased safety awareness;Pain       PT Treatment Interventions DME instruction;Gait training;Functional mobility training;Therapeutic activities;Therapeutic exercise;Balance training;Patient/family education    PT Goals (Current goals can be found in the Care Plan section)  Acute Rehab PT Goals Patient Stated Goal: reduce pain PT Goal Formulation: With patient Time For Goal Achievement: 08/11/20 Potential to Achieve Goals: Fair    Frequency Min 3X/week   Barriers to discharge  Co-evaluation PT/OT/SLP Co-Evaluation/Treatment: Yes Reason for Co-Treatment: Complexity of the patient's impairments (multi-system involvement);Necessary to address  cognition/behavior during functional activity;For patient/therapist safety;To address functional/ADL transfers PT goals addressed during session: Mobility/safety with mobility;Balance;Strengthening/ROM OT goals addressed during session: ADL's and self-care       AM-PAC PT "6 Clicks" Mobility  Outcome Measure Help needed turning from your back to your side while in a flat bed without using bedrails?: Total Help needed moving from lying on your back to sitting on the side of a flat bed without using bedrails?: Total Help needed moving to and from a bed to a chair (including a wheelchair)?: Total Help needed standing up from a chair using your arms (e.g., wheelchair or bedside chair)?: Total Help needed to walk in hospital room?: Total Help needed climbing 3-5 steps with a railing? : Total 6 Click Score: 6    End of Session   Activity Tolerance: Patient limited by pain;Patient limited by fatigue Patient left: in bed;with call bell/phone within reach;with bed alarm set Nurse Communication: Mobility status;Need for lift equipment PT Visit Diagnosis: Other abnormalities of gait and mobility (R26.89);Muscle weakness (generalized) (M62.81);Pain Pain - Right/Left: Right Pain - part of body: Knee (and back)    Time: 9381-0175 PT Time Calculation (min) (ACUTE ONLY): 20 min   Charges:   PT Evaluation $PT Eval Moderate Complexity: 1 Mod          Rolm Baptise, PT, DPT   Acute Rehabilitation Department Pager #: [phone]  Gaetana Michaelis 07/28/2020, 10:42 AM

## 2020-07-28 NOTE — Progress Notes (Signed)
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  Presumably osteomyelitis given exposed bone.  No intraoperative cultures collected, however, blood cultures from admission are negative.  Wound culture obtained from clinic on 12/10 with E. coli and Streptococcus anginosus.   PLAN:    --Plan for 6 weeks of antibiotics for osteomyelitis treatment targeting organisms from 12/10 wound culture.  See OPAT note below  Diagnosis: Osteomyelitis  Culture Result: Strep anginosis and E coli  Allergies  Allergen Reactions  . T-Pa [Alteplase] Swelling    Does okay with it if premedicated with benadryl.  . Codeine Itching    OPAT Orders Discharge antibiotics to be given via PICC line Discharge antibiotics: Per pharmacy protocol Cefazolin 2gm q8h Duration: 6 weeks End Date: 09/08/20  [facility] Care Per Protocol:  Home health RN for IV administration and teaching; PICC line care and labs.    Labs weekly while on IV antibiotics: _x_ CBC with differential _x_ BMP __ CMP _x_ CRP _x_ ESR __ Vancomycin trough __ CK  _x_ Please pull PIC at completion of IV antibiotics __ Please leave PIC in place until doctor has seen patient or been notified  Fax weekly labs to [phone]  Clinic Follow Up Appt: 09/04/20 at 10:45am with Dr Juleen [name]     MEDICATIONS:    Scheduled Meds: . apixaban  5 mg Oral BID  . busPIRone  15 mg Oral BID  . chlorhexidine  15 mL Mouth/Throat NOW  . docusate sodium  100 mg Oral BID  . donepezil  10 mg Oral QHS  . dronabinol  2.5 mg Oral BID AC  . feeding  supplement  237 mL Oral TID BM  . linaclotide  145 mcg Oral Daily  . loratadine  10 mg Oral Daily  . memantine  28 mg Oral QHS  . polyethylene glycol  17 g Oral BID  . senna-docusate  1 tablet Oral BID  . sodium chloride flush  3 mL Intravenous Q12H    Continuous Infusions: . sodium chloride 10 mL/hr at 07/28/20 0539  . ceFEPime (MAXIPIME) IV Stopped (07/27/20 2221)  . lactated ringers    . vancomycin      PRN Meds: acetaminophen **OR** acetaminophen, alum & mag hydroxide-simeth, bisacodyl, cycloSPORINE, menthol-cetylpyridinium **OR** phenol, morphine injection, oxyCODONE, polyethylene glycol, prochlorperazine, sodium chloride flush, sodium phosphate  SUBJECTIVE:   No new complaints this morning.  Reports pain from the surgery controlled  Review of Systems  Constitutional: Negative for chills and fever.  Respiratory: Negative.   Cardiovascular: Negative.   Gastrointestinal: Negative.   Musculoskeletal: Negative for back pain and neck pain.  All other systems reviewed and are negative.    OBJECTIVE:   Allergies  Allergen Reactions  . T-Pa [Alteplase] Swelling    Does okay with it if premedicated with benadryl.  . Codeine Itching    Blood pressure 127/68, pulse 96, temperature 97.7 F (36.5 C), temperature source Oral, resp. rate 17, height '5\' 2"'  (1.575 m), weight 86.1 kg, SpO2 98 %. Body mass index is 34.72 kg/m.  Physical Exam Constitutional:  General: She is not in acute distress.    Appearance: Normal appearance.  HENT:     Head: Normocephalic and atraumatic.  Eyes:     Extraocular Movements: Extraocular movements intact.     Conjunctiva/sclera: Conjunctivae normal.  Cardiovascular:     Rate and Rhythm: Normal rate and regular rhythm.     Heart sounds: Murmur heard.    Pulmonary:     Effort: Pulmonary effort is normal. No respiratory distress.     Comments: On nasal cannula for comfort. Neurological:     General: No focal deficit present.      Mental Status: She is alert.     Comments: Oriented to person, place, year.  She is unsure of her date of birth.      Lab Results & Microbiology Lab Results  Component Value Date   WBC 19.2 (H) 07/28/2020   HGB 9.5 (L) 07/28/2020   HCT 32.3 (L) 07/28/2020   MCV 95.8 07/28/2020   PLT 311 07/28/2020    Lab Results  Component Value Date   NA 140 07/28/2020   K 3.9 07/28/2020   CO2 24 07/28/2020   GLUCOSE 113 (H) 07/28/2020   BUN 6 (L) 07/28/2020   CREATININE 0.43 (L) 07/28/2020   CALCIUM 8.9 07/28/2020   GFRNONAA >60 07/28/2020   GFRAA >60 05/14/2020    Lab Results  Component Value Date   ALT 29 07/28/2020   AST 23 07/28/2020   ALKPHOS 63 07/28/2020   BILITOT 1.1 07/28/2020     I have reviewed the micro and lab results in Epic.  Imaging No results found.     [name] for Infectious Disease [facility] [phone] pager 07/28/2020, 9:52 AM  I have spent a total of 25 minutes with the patient reviewing hospital notes,  test results, labs and examining the patient as well as establishing an assessment and plan.

## 2020-07-28 NOTE — Progress Notes (Signed)
Initial Nutrition Assessment  DOCUMENTATION CODES:   Obesity unspecified   INTERVENTION:  Provide Ensure Enlive po QID, each supplement provides 350 kcal and 20 grams of protein.  Provide 30 ml Prosource plus po BID, each supplement provides 100 kcal and 15 grams of protein.   Provide Juven BID, each packet provides 95 calories, 2.5 grams of protein for wound healing.  Encourage adequate PO intake.   NUTRITION DIAGNOSIS:   Increased nutrient needs related to wound healing as evidenced by estimated needs.  GOAL:   Patient will meet greater than or equal to 90% of their needs  MONITOR:   PO intake,Supplement acceptance,Skin,Weight trends,Labs,I & O's  REASON FOR ASSESSMENT:   Consult Assessment of nutrition requirement/status  ASSESSMENT:   79 y.o. 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 Pt reports no PO intake yet today. Noted pt with history of dementia. RD unable to obtain recent nutrition history, pt poor historian. Weight fluctuating per weight records. Pt currently has Ensure and Juven ordered and has been consuming them. RD to increase nutritional supplementation to aid in caloric and protein needs as well as in wound healing. Pt encouraged to eat her food at meals and to drink her supplements.   NUTRITION - FOCUSED PHYSICAL EXAM:  Flowsheet Row Most Recent Value  Orbital Region Unable to assess  Upper Arm Region No depletion  Thoracic and Lumbar Region No depletion  Buccal Region Moderate depletion  Temple Region Severe depletion  Clavicle Bone  Region Moderate depletion  Clavicle and Acromion Bone Region Moderate depletion  Scapular Bone Region Unable to assess  Dorsal Hand Unable to assess  Patellar Region Moderate depletion  Anterior Thigh Region Moderate depletion  Posterior Calf Region Moderate depletion  Edema (RD Assessment) Moderate  Hair Reviewed  Eyes Reviewed  Mouth Reviewed  Skin Reviewed  Nails Reviewed     Labs and medications reviewed.  Diet Order:   Diet Order            DIET - DYS 1 Room service appropriate? Yes; Fluid consistency: Thin  Diet effective now                 EDUCATION NEEDS:   Not appropriate for education at this time  Skin:  Skin Assessment: Skin Integrity Issues: Skin Integrity Issues:: Stage I,Unstageable Stage I: sacrum Unstageable: decubitus ulcer of the distal thoracic spine  Last BM:  12/15  Height:   Ht Readings from Last 1 Encounters:  07/27/20 5\' 2"  (1.575 m)    Weight:   Wt Readings from Last 1 Encounters:  07/28/20 86.1 kg    BMI:  Body mass index is 34.72 kg/m.  Estimated Nutritional Needs:   Kcal:  1900-2100  Protein:  110-120 grams  Fluid:  >/=1.9 L/day  07/30/20, MS, RD, LDN RD pager number/after hours weekend pager number on Amion.

## 2020-07-28 NOTE — Progress Notes (Signed)
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  Presented with sepsis secondary to wound infection. Underwent hardware removal on 07/27/2020.  Currently plan is continue to monitor improvement in oral intake and arrange for SNF likely Monday.  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   With tachypnea, tachycardia and leukocytosis meeting SIRS criteria. Sepsis physiology has improved. Blood culture with no growth in 5 days. Initially patient was on Vancomycin and cefepime, which was discontinued on 12/12 as per ID recommendation.  Neurosurgery on board  S/P surgical debridement and removal of metal hardware on 12/16. Eliquis was on hold and patient was placed on IV heparin in the interim. Currently back on Eliquis. ID recommends  6 days of antibiotics with IV cefazolin. PICC line inserted. Drains removed on 12/17. Continue dressing change per neurosurgery.  Exposed hardware from surgical decompression and arthrodesis from L1-S1 on 03/31/2006 secondary to symptomatic spondylitic stenosis with idiopathic scoliosis.  Per note on 06/19/20, patient has had decubitus ulcer with exposed hardware being treated at home with wet to dry dressings and santyl.   Per family ulcer was initially identified after discharge from the hospital.  History of submassivebilateral saddlePEand right and left DVT ( 9/20201).  Patient  has IVC filterandhas been adherent with Eliquis per family. CT abdomen imaging concerning for worsening edema that may be related to new clot burden with right common femoral vein. Venous duplex also shows proximal right common femoral vein DVT of indeterminate age. Currently on Eliquis again.  Elevated LFTs.  Likely secondary to sepsis, improved.  Incidental finding of bladder wall thickening. UA unremarkable  Constipation.   continuescheduled stool softeners and MiraLAX   Left upper extremity swelling  in setting of hematoma from prior hospitalization secondary to TPA. Continue extremity elevation.   Back on Eliquis, Monitor.  Chronic anemia, normocytic,  hemoglobin stable at baseline 8-9.   Hypertension,  on IV hydralazine.  On amlodipine, Lasix and lisinopril at home.    Dementia with behavior disturbances, stable. 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 Had loop recorder placed at that time. no new focal deficits Currently on therapeutic anticoagulation therefore not on Plavix.  OSA. On CPAP at home.  Obesity Placing the patient at high risk for poor outcomes with poor wound healing given her limited mobility. Body mass index is 34.72 kg/m.  Nutrition Problem: Increased nutrient needs Etiology: wound healing Interventions: Interventions: Ensure Enlive (each supplement provides 350kcal and 20 grams of protein),Prostat,Juven  Pressure Injury 07/22/20 Sacrum Mid Stage 1 -  Intact skin with non-blanchable redness of a localized area usually over a bony prominence. old, closed pressure injury coccyx. (Active)  07/22/20 1535  Location: Sacrum  Location Orientation: Mid  Staging: Stage 1 -  Intact skin with non-blanchable redness of a localized area usually over a bony  prominence.  Wound Description (Comments): old,  closed pressure injury coccyx.  Present on Admission: Yes    Diet: Dysphagia 1 diet.  Patient does not eat regular food and her family only eats baby food/pured food DVT Prophylaxis: Therapeutic Anticoagulation with Eliquis  SCD's Start: 07/27/20 2116 apixaban (ELIQUIS) tablet 5 mg    Advance goals of care discussion: Limited code  Family Communication: family was present at bedside, at the time of interview.  Opportunity was given to ask question and all questions were answered satisfactorily.   Disposition:  Status is: Inpatient  Remains inpatient appropriate because:IV treatments appropriate due to intensity of illness or inability to take PO and Inpatient level of care appropriate due to severity of illness   Dispo:  Patient From: Home  Planned Disposition: Home with Health Care Svc  Expected discharge date: 07/31/2020  Medically stable for discharge: No  Subjective: No acute complaint.  No nausea no vomiting.  No pain.  Breathing okay.  Minimal oral intake.  Per family patient goes without a bowel movement for a week regularly.  Physical Exam:  General: Appear in mild distress, no Rash; Oral Mucosa Clear, moist. no Abnormal Neck Mass Or lumps, Conjunctiva normal  Cardiovascular: S1 and S2 Present, no Murmur, Respiratory: good respiratory effort, Bilateral Air entry present and CTA, no Crackles, no wheezes Abdomen: Bowel Sound present, Soft and no tenderness Extremities: trace Pedal edema Neurology: alert and oriented to place and person affect flat in affect. no new focal deficit Gait not checked due to patient safety concerns  Vitals:   07/28/20 0500 07/28/20 0824 07/28/20 1126 07/28/20 1515  BP:   (!) 150/72   Pulse:   93   Resp:   12   Temp:  97.7 F (36.5 C) 97.9 F (36.6 C) 98.1 F (36.7 C)  TempSrc:  Oral  Oral  SpO2:   (!) 88%   Weight: 86.1 kg     Height:        Intake/Output Summary (Last 24 hours) at  07/28/2020 2021 Last data filed at 07/28/2020 1730 Gross per 24 hour  Intake 712.44 ml  Output 500 ml  Net 212.44 ml   Filed Weights   07/27/20 0459 07/27/20 1530 07/28/20 0500  Weight: 79.5 kg 79.5 kg 86.1 kg    Data Reviewed: I have personally reviewed and interpreted daily labs, tele strips, imagings as discussed above. I reviewed all nursing notes, pharmacy notes, vitals, pertinent old records I have discussed plan of care as described above with RN and patient/family.  CBC: Recent Labs  Lab 07/24/20 0418 07/25/20 0400 07/26/20 0103 07/27/20 0429 07/28/20 0140  WBC 10.2 11.1* 11.4* 11.3* 19.2*  NEUTROABS  --   --   --   --  16.3*  HGB 9.2* 8.6* 9.0* 9.3* 9.5*  HCT 31.3* 29.5* 30.7* 30.8* 32.3*  MCV 95.4 95.8 95.9 94.2 95.8  PLT 343 327 332 309 311   Basic Metabolic Panel: Recent Labs  Lab 07/22/20 0435 07/23/20 0458 07/23/20 1712 07/24/20 0418 07/25/20 0400 07/26/20 0103 07/27/20 0429 07/28/20 0140  NA 144 143   < > 144 144 142 140 140  K 3.7 3.1*   < > 3.7 3.7 3.2* 3.3* 3.9  CL 109 109   < > 109 110 108 105 104  CO2 25 23   < > 24 25 26 26 24   GLUCOSE 93 86   < > 110* 112* 115* 91 113*  BUN 11 8   < > 9 9 9  7* 6*  CREATININE 0.54 0.40*   < >  0.45 0.43* 0.41* 0.34* 0.43*  CALCIUM 8.9 8.6*   < > 8.8* 8.9 8.7* 8.8* 8.9  MG 2.2 2.0  --  2.1  --   --   --  1.9   < > = values in this interval not displayed.    Studies: [place] EKG SITE RITE  Result Date: 07/28/2020 If Site Rite image not attached, placement could not be confirmed due to current cardiac rhythm.   Scheduled Meds:  (feeding supplement) PROSource Plus  30 mL Oral BID BM   apixaban  5 mg Oral BID   busPIRone  15 mg Oral BID   donepezil  10 mg Oral QHS   dronabinol  2.5 mg Oral BID AC   feeding supplement  237 mL Oral QID   linaclotide  145 mcg Oral Daily   loratadine  10 mg Oral Daily   memantine  28 mg Oral QHS   multivitamin with minerals  1 tablet Oral Daily   nutrition  supplement (JUVEN)  1 packet Oral BID BM   [START ON 07/29/2020] polyethylene glycol  17 g Oral Daily   senna-docusate  1 tablet Oral BID   Continuous Infusions:   ceFAZolin (ANCEF) IV 2 g (07/28/20 1813)   lactated ringers     PRN Meds: acetaminophen **OR** acetaminophen, bisacodyl, cycloSPORINE, menthol-cetylpyridinium **OR** phenol, morphine injection, oxyCODONE, prochlorperazine, sodium phosphate  Time spent: 35 minutes  Author: Lynden Oxford, MD [facility] 07/28/2020 8:21 PM  To reach On-call, see care teams to locate the attending and reach out via [email or web address]. Between 7PM-7AM, please contact night-coverage If you still have difficulty reaching the attending provider, please page the [facility] (Director on Call) for [facility] on amion for assistance.

## 2020-07-29 LAB — IRON AND TIBC
Iron: 16 ug/dL — ABNORMAL LOW (ref 28–170)
Saturation Ratios: 14 % (ref 10.4–31.8)
TIBC: 115 ug/dL — ABNORMAL LOW (ref 250–450)
UIBC: 99 ug/dL

## 2020-07-29 LAB — TECHNOLOGIST SMEAR REVIEW

## 2020-07-29 LAB — CBC
HCT: 25.2 % — ABNORMAL LOW (ref 36.0–46.0)
HCT: 26.1 % — ABNORMAL LOW (ref 36.0–46.0)
Hemoglobin: 7.7 g/dL — ABNORMAL LOW (ref 12.0–15.0)
Hemoglobin: 7.7 g/dL — ABNORMAL LOW (ref 12.0–15.0)
MCH: 28.9 pg (ref 26.0–34.0)
MCH: 28.1 pg (ref 26.0–34.0)
MCHC: 30.6 g/dL (ref 30.0–36.0)
MCHC: 29.5 g/dL — ABNORMAL LOW (ref 30.0–36.0)
MCV: 94.7 fL (ref 80.0–100.0)
MCV: 95.3 fL (ref 80.0–100.0)
Platelets: 257 10*3/uL (ref 150–400)
Platelets: 245 10*3/uL (ref 150–400)
RBC: 2.66 MIL/uL — ABNORMAL LOW (ref 3.87–5.11)
RBC: 2.74 MIL/uL — ABNORMAL LOW (ref 3.87–5.11)
RDW: 17.6 % — ABNORMAL HIGH (ref 11.5–15.5)
RDW: 18 % — ABNORMAL HIGH (ref 11.5–15.5)
WBC: 12.5 10*3/uL — ABNORMAL HIGH (ref 4.0–10.5)
WBC: 13.5 10*3/uL — ABNORMAL HIGH (ref 4.0–10.5)
nRBC: 0.2 % (ref 0.0–0.2)
nRBC: 0.1 % (ref 0.0–0.2)

## 2020-07-29 LAB — RETICULOCYTES
Immature Retic Fract: 32.7 % — ABNORMAL HIGH (ref 2.3–15.9)
RBC.: 2.74 MIL/uL — ABNORMAL LOW (ref 3.87–5.11)
Retic Count, Absolute: 84.9 10*3/uL (ref 19.0–186.0)
Retic Ct Pct: 3.1 % (ref 0.4–3.1)

## 2020-07-29 LAB — CBC WITH DIFFERENTIAL/PLATELET
Abs Immature Granulocytes: 0.07 10*3/uL (ref 0.00–0.07)
Basophils Absolute: 0 10*3/uL (ref 0.0–0.1)
Basophils Relative: 0 %
Eosinophils Absolute: 0.1 10*3/uL (ref 0.0–0.5)
Eosinophils Relative: 1 %
HCT: 25.9 % — ABNORMAL LOW (ref 36.0–46.0)
Hemoglobin: 7.6 g/dL — ABNORMAL LOW (ref 12.0–15.0)
Immature Granulocytes: 1 %
Lymphocytes Relative: 10 %
Lymphs Abs: 1.4 10*3/uL (ref 0.7–4.0)
MCH: 28 pg (ref 26.0–34.0)
MCHC: 29.3 g/dL — ABNORMAL LOW (ref 30.0–36.0)
MCV: 95.6 fL (ref 80.0–100.0)
Monocytes Absolute: 1.2 10*3/uL — ABNORMAL HIGH (ref 0.1–1.0)
Monocytes Relative: 9 %
Neutro Abs: 10.7 10*3/uL — ABNORMAL HIGH (ref 1.7–7.7)
Neutrophils Relative %: 79 %
Platelets: 255 10*3/uL (ref 150–400)
RBC: 2.71 MIL/uL — ABNORMAL LOW (ref 3.87–5.11)
RDW: 18 % — ABNORMAL HIGH (ref 11.5–15.5)
WBC: 13.5 10*3/uL — ABNORMAL HIGH (ref 4.0–10.5)
nRBC: 0.2 % (ref 0.0–0.2)

## 2020-07-29 LAB — COMPREHENSIVE METABOLIC PANEL
ALT: 18 U/L (ref 0–44)
AST: 17 U/L (ref 15–41)
Albumin: 1.7 g/dL — ABNORMAL LOW (ref 3.5–5.0)
Alkaline Phosphatase: 56 U/L (ref 38–126)
Anion gap: 9 (ref 5–15)
BUN: 9 mg/dL (ref 8–23)
CO2: 25 mmol/L (ref 22–32)
Calcium: 8.3 mg/dL — ABNORMAL LOW (ref 8.9–10.3)
Chloride: 105 mmol/L (ref 98–111)
Creatinine, Ser: 0.68 mg/dL (ref 0.44–1.00)
GFR, Estimated: >60 mL/min (ref >60)
Glucose, Bld: 104 mg/dL — ABNORMAL HIGH (ref 70–99)
Potassium: 3.8 mmol/L (ref 3.5–5.1)
Sodium: 139 mmol/L (ref 135–145)
Total Bilirubin: 0.6 mg/dL (ref 0.3–1.2)
Total Protein: 5.6 g/dL — ABNORMAL LOW (ref 6.5–8.1)

## 2020-07-29 LAB — VITAMIN B12: Vitamin B-12: 743 pg/mL (ref 180–914)

## 2020-07-29 LAB — MAGNESIUM: Magnesium: 1.8 mg/dL (ref 1.7–2.4)

## 2020-07-29 MED ORDER — FERROUS SULFATE 325 (65 FE) MG PO TABS
325.0000 mg | ORAL_TABLET | Freq: Two times a day (BID) | ORAL | Status: DC
  Administered 2020-07-29 – 2020-08-07 (×15): 325 mg via ORAL
  Filled 2020-07-29 (×16): qty 1

## 2020-07-29 MED ORDER — FE FUMARATE-B12-VIT C-FA-IFC PO CAPS: 1.0000 | ORAL_CAPSULE | Freq: Two times a day (BID) | ORAL | Status: DC

## 2020-07-29 MED ORDER — BISACODYL 5 MG PO TBEC
5.0000 mg | DELAYED_RELEASE_TABLET | Freq: Every day | ORAL | Status: DC | PRN
  Administered 2020-08-01: 14:00:00 5 mg via ORAL
  Filled 2020-07-29: qty 1

## 2020-07-29 NOTE — Progress Notes (Signed)
This RN has been encouraging oral intake frequently throughout shift of fluids and food, especially ensure beverages.  Pt states she is not hungry and only wants to take sips of the ensure and other fluids with meds.  Pt was educated on importance of caloric intake and this RN will continue to encourage oral intake of fluids and calories.

## 2020-07-29 NOTE — Progress Notes (Signed)
Patient ID: [name], female   DOB: 11/08/1940, 79 y.o.   MRN: [id number] Vital signs are stable Her incision appears to be dry and healing nicely Since the drain has been out there is a minimal fluid collection Continues to rest on her sides We can mobilize her as she tolerates with PT and OT Final disposition for patient needs to be decided upon, home versus SNF

## 2020-07-29 NOTE — Progress Notes (Addendum)
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  Presented with sepsis secondary to wound infection. Underwent hardware removal on 07/27/2020.  Currently plan is continue to monitor improvement in oral intake and arrange for SNF likely Monday.  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   With tachypnea, tachycardia and leukocytosis meeting SIRS criteria. Sepsis physiology has improved. Blood culture with no growth in 5 days. Initially patient was on Vancomycin and cefepime, which was discontinued on 12/12 as per ID recommendation.  Neurosurgery on board  S/P surgical debridement and removal of metal hardware on 12/16. ID recommends  6 days of antibiotics with IV cefazolin. PICC line inserted. Drains removed on 12/17. Continue dressing change per neurosurgery.  Exposed hardware H/o surgical decompression and arthrodesis from L1-S1 on 03/31/2006  H/o spondylitic stenosis with idiopathic scoliosis.  Per note on 06/19/20, patient has had decubitus ulcer with exposed hardware being treated at home with wet to dry dressings and santyl.   Per family ulcer was initially identified after discharge from the hospital.  History of submassivebilateral saddlePEand right and left DVT ( 9/20201).  SP IVC filter Compliant with Eliquis Patient was briefly off of Eliquis for procedure and was on heparin. Started back on  Eliquis post procedure. Due to anemia worsening on 12/18 Eliquis is back on hold. If hemoglobin remained stable resume IV heparin tomorrow on 12/19. Check ultrasound Doppler left upper extremity and right lower extremity.  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 Patient appears to have anemia of chronic disease 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 intake.  (She eats baby food only) But had a drop from 9.5-7.7 on 12/18. Appears to be hemodilution as well as a component of postop blood loss anemia. H&H x3 since have been stable and no external bleeding has been reported. We will hold Eliquis on 12/18 and resume IV heparin on 12/19 if H&H remained stable.  Elevated LFTs.  Likely secondary to sepsis, improved.  Incidental finding of bladder wall thickening. UA unremarkable  Constipation.   continuescheduled stool softeners and MiraLAX   Left upper extremity hematoma   Swollen in setting of hematoma from prior hospitalization secondary to TPA.  Continue extremity elevation.    Hypertension,  Blood pressure stable. On IV hydralazine.  On amlodipine, Lasix and lisinopril at home.    Dementia with behavior disturbances, stable. 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 Had loop recorder placed at that time. no new focal deficits Currently on therapeutic anticoagulation therefore not on Plavix.  OSA. On CPAP at home.  Obesity Placing the patient at high risk for poor outcomes with poor wound healing given her limited mobility. Body mass index is 35.16 kg/m.  Nutrition Problem: Increased nutrient needs Etiology: wound healing Interventions: Interventions: Ensure Enlive (each supplement provides  350kcal and 20 grams of protein),Prostat,Juven  Pressure Injury 07/22/20 Sacrum Mid Stage 1 -  Intact skin with non-blanchable redness of a localized area usually over a bony prominence. old, closed pressure injury coccyx. (Active)  07/22/20 1535  Location: Sacrum  Location Orientation: Mid  Staging: Stage 1 -  Intact skin with non-blanchable redness of a localized area usually over a bony prominence.  Wound Description (Comments): old, closed pressure injury coccyx.  Present on Admission: Yes    Diet: Dysphagia 1 diet.  Patient does not eat regular food and her family only eats baby food/pured food DVT Prophylaxis: Therapeutic Anticoagulation with Eliquis  SCD's Start: 07/27/20 2116    Advance goals of care discussion: Limited code  Family Communication: family was present at bedside, at the time of interview.  Opportunity was given to ask question and all questions were answered satisfactorily.   Disposition:  Status is: Inpatient  Remains inpatient appropriate because:IV treatments appropriate due to intensity of illness or inability to take PO and Inpatient level of care appropriate due to severity of illness  Dispo:  Patient From: Home  Planned Disposition: Home with Health Care Svc  Expected discharge date: 07/31/2020  Medically stable for discharge: No  Subjective: No acute complaint.  No nausea no vomiting.  No pain.  Breathing okay.  Minimal oral intake.  Per family patient goes without a bowel movement for a week regularly.  Physical Exam:  General: Appear in mild distress, no Rash; Oral Mucosa Clear, moist. no Abnormal Neck Mass Or lumps, Conjunctiva normal  Referred to sleep in her left lateral side Cardiovascular: S1 and S2 Present, no Murmur, Respiratory: good respiratory effort, Bilateral Air entry present and CTA, no Crackles, no wheezes Abdomen: Bowel Sound present, Soft and no tenderness Extremities: Bilateral right more than left pedal edema, left upper  extremity edema as well Neurology: alert and oriented to place and person affect flat in affect.  Chronic left-sided weakness, no new focal deficit Gait not checked due to patient safety concerns  Vitals:   07/29/20 0512 07/29/20 0755 07/29/20 1058 07/29/20 1539  BP: 127/63 129/69 136/74 136/71  Pulse: 83 85 85 96  Resp: 15 18 16 20   Temp: 98.2 F (36.8 C) 99.7 F (37.6 C) 98.8 F (37.1 C) 97.9 F (36.6 C)  TempSrc: Oral Axillary Axillary Axillary  SpO2: 95% 96% 97% 97%  Weight:      Height:        Intake/Output Summary (Last 24 hours) at 07/29/2020 1727 Last data filed at 07/29/2020 1616 Gross per 24 hour  Intake 1408.72 ml  Output 250 ml  Net 1158.72 ml   Filed Weights   07/27/20 1530 07/28/20 0500 07/29/20 0500  Weight: 79.5 kg 86.1 kg 87.2 kg    Data Reviewed: I have personally reviewed and interpreted daily labs, tele strips, imagings as discussed above. I reviewed all nursing notes, pharmacy notes, vitals, pertinent old records I have discussed plan of care as described above with RN and patient/family.  CBC: Recent Labs  Lab 07/27/20 0429 07/28/20 0140 07/29/20 0500 07/29/20 0908 07/29/20 1520  WBC 11.3* 19.2* 12.5* 13.5* 13.5*  NEUTROABS  --  16.3*  --  10.7*  --   HGB 9.3* 9.5* 7.7* 7.6* 7.7*  HCT 30.8* 32.3* 25.2* 25.9* 26.1*  MCV 94.2 95.8 94.7 95.6 95.3  PLT 309 311 257 255 245   Basic Metabolic Panel: Recent Labs  Lab 07/23/20 0458 07/23/20 1712 07/24/20 0418 07/25/20 0400 07/26/20 0103 07/27/20 0429 07/28/20  0140 07/29/20 0500  NA 143   < > 144 144 142 140 140 139  K 3.1*   < > 3.7 3.7 3.2* 3.3* 3.9 3.8  CL 109   < > 109 110 108 105 104 105  CO2 23   < > 24 25 26 26 24 25   GLUCOSE 86   < > 110* 112* 115* 91 113* 104*  BUN 8   < > 9 9 9  7* 6* 9  CREATININE 0.40*   < > 0.45 0.43* 0.41* 0.34* 0.43* 0.68  CALCIUM 8.6*   < > 8.8* 8.9 8.7* 8.8* 8.9 8.3*  MG 2.0  --  2.1  --   --   --  1.9 1.8   < > = values in this interval not  displayed.    Studies: No results found.  Scheduled Meds: . (feeding supplement) PROSource Plus  30 mL Oral BID BM  . busPIRone  15 mg Oral BID  . Chlorhexidine Gluconate Cloth  6 each Topical Daily  . donepezil  10 mg Oral QHS  . dronabinol  2.5 mg Oral BID AC  . feeding supplement  237 mL Oral QID  . ferrous sulfate  325 mg Oral BID WC  . linaclotide  145 mcg Oral Daily  . loratadine  10 mg Oral Daily  . memantine  28 mg Oral QHS  . multivitamin with minerals  1 tablet Oral Daily  . nutrition supplement (JUVEN)  1 packet Oral BID BM  . polyethylene glycol  17 g Oral Daily  . senna-docusate  1 tablet Oral BID  . sodium chloride flush  10-40 mL Intracatheter Q12H   Continuous Infusions: .  ceFAZolin (ANCEF) IV 2 g (07/29/20 1543)  . lactated ringers 10 mL/hr at 07/29/20 0218   PRN Meds: acetaminophen **OR** acetaminophen, bisacodyl, bisacodyl, cycloSPORINE, menthol-cetylpyridinium **OR** phenol, morphine injection, oxyCODONE, prochlorperazine, sodium chloride flush, sodium phosphate  Time spent: 35 minutes  Author: 07/31/20, MD [facility] 07/29/2020 5:27 PM  To reach On-call, see care teams to locate the attending and reach out via [email or web address]. Between 7PM-7AM, please contact night-coverage If you still have difficulty reaching the attending provider, please page the [facility] (Director on Call) for [facility] on amion for assistance.

## 2020-07-30 ENCOUNTER — Inpatient Hospital Stay (HOSPITAL_COMMUNITY): Payer: Medicare Other

## 2020-07-30 DIAGNOSIS — Z86718 Personal history of other venous thrombosis and embolism: Secondary | ICD-10-CM

## 2020-07-30 DIAGNOSIS — R609 Edema, unspecified: Secondary | ICD-10-CM

## 2020-07-30 LAB — APTT: aPTT: 71 seconds — ABNORMAL HIGH (ref 24–36)

## 2020-07-30 LAB — CBC
HCT: 25.8 % — ABNORMAL LOW (ref 36.0–46.0)
HCT: 26.8 % — ABNORMAL LOW (ref 36.0–46.0)
Hemoglobin: 7.5 g/dL — ABNORMAL LOW (ref 12.0–15.0)
Hemoglobin: 7.9 g/dL — ABNORMAL LOW (ref 12.0–15.0)
MCH: 28.1 pg (ref 26.0–34.0)
MCH: 28.5 pg (ref 26.0–34.0)
MCHC: 29.1 g/dL — ABNORMAL LOW (ref 30.0–36.0)
MCHC: 29.5 g/dL — ABNORMAL LOW (ref 30.0–36.0)
MCV: 96.6 fL (ref 80.0–100.0)
MCV: 96.8 fL (ref 80.0–100.0)
Platelets: 252 10*3/uL (ref 150–400)
Platelets: 258 10*3/uL (ref 150–400)
RBC: 2.67 MIL/uL — ABNORMAL LOW (ref 3.87–5.11)
RBC: 2.77 MIL/uL — ABNORMAL LOW (ref 3.87–5.11)
RDW: 17.8 % — ABNORMAL HIGH (ref 11.5–15.5)
RDW: 17.9 % — ABNORMAL HIGH (ref 11.5–15.5)
WBC: 13.7 10*3/uL — ABNORMAL HIGH (ref 4.0–10.5)
WBC: 13.8 10*3/uL — ABNORMAL HIGH (ref 4.0–10.5)
nRBC: 0.2 % (ref 0.0–0.2)
nRBC: 0.3 % — ABNORMAL HIGH (ref 0.0–0.2)

## 2020-07-30 LAB — COMPREHENSIVE METABOLIC PANEL
ALT: 13 U/L (ref 0–44)
AST: 19 U/L (ref 15–41)
Albumin: 1.6 g/dL — ABNORMAL LOW (ref 3.5–5.0)
Alkaline Phosphatase: 57 U/L (ref 38–126)
Anion gap: 9 (ref 5–15)
BUN: 15 mg/dL (ref 8–23)
CO2: 26 mmol/L (ref 22–32)
Calcium: 8.6 mg/dL — ABNORMAL LOW (ref 8.9–10.3)
Chloride: 107 mmol/L (ref 98–111)
Creatinine, Ser: 0.53 mg/dL (ref 0.44–1.00)
GFR, Estimated: >60 mL/min (ref >60)
Glucose, Bld: 121 mg/dL — ABNORMAL HIGH (ref 70–99)
Potassium: 3.5 mmol/L (ref 3.5–5.1)
Sodium: 142 mmol/L (ref 135–145)
Total Bilirubin: 0.4 mg/dL (ref 0.3–1.2)
Total Protein: 5.5 g/dL — ABNORMAL LOW (ref 6.5–8.1)

## 2020-07-30 LAB — HEPARIN LEVEL (UNFRACTIONATED): Heparin Unfractionated: 1.82 IU/mL — ABNORMAL HIGH (ref 0.30–0.70)

## 2020-07-30 LAB — MAGNESIUM: Magnesium: 2 mg/dL (ref 1.7–2.4)

## 2020-07-30 MED ORDER — HEPARIN (PORCINE) 25000 UT/250ML-% IV SOLN
1000.0000 [IU]/h | INTRAVENOUS | Status: DC
  Administered 2020-07-30: 10:00:00 950 [IU]/h via INTRAVENOUS
  Administered 2020-07-31: 12:00:00 1000 [IU]/h via INTRAVENOUS
  Filled 2020-07-30 (×3): qty 250

## 2020-07-30 NOTE — TOC Initial Note (Signed)
Transition of Care [facility]) - Initial/Assessment Note    Patient Details  Name: [name] MRN: [id number] Date of Birth: 1941-07-17  Transition of Care [facility]) CM/SW Contact:    Bary Castilla, LCSW Phone Number: [phone] 07/30/2020, 1:53 PM  Clinical Narrative:                 CSW met with patient and patient's son [name]  to discuss PT recommendation of a SNF. Patient was aware of recommendation and in agreement with going to a ST SNF. CSW discussed the SNF process.CSW provided patient's son with medicare.gov rating list.  They gave CSW permission to fax referrals out to local facilities.CSW answered questions about the SNF process and the next steps in the process.  [name] is very involved in patient's medical care. He wants TOC to contact him and his wife with the bed offers.   [name] explained that patient has been to the [facility] for rehab. CSW explained that a SNF was recommended.  TOC team will continue to assist with discharge planning needs.    Expected Discharge Plan: Skilled Nursing Facility Barriers to Discharge: SNF Pending bed offer   Patient Goals and CMS Choice Patient states their goals for this hospitalization and ongoing recovery are:: To return home CMS Medicare.gov Compare Post Acute Care list provided to:: Patient Represenative (must comment) (son) Choice offered to / list presented to : [place]  Expected Discharge Plan and Services Expected Discharge Plan: [place] arrangements for the past 2 months: Single Family Home                                      Prior Living Arrangements/Services Living arrangements for the past 2 months: Single Family Home Lives with:: Self,Adult Children Patient language and need for interpreter reviewed:: Yes Do you feel safe going back to the place where you live?: Yes        Care giver support system in place?: Yes (comment)      Activities of Daily  Living Home Assistive Devices/Equipment: Eyeglasses,Walker (specify type) ADL Screening (condition at time of admission) Patient's cognitive ability adequate to safely complete daily activities?: No Is the patient deaf or have difficulty hearing?: No Does the patient have difficulty seeing, even when wearing glasses/contacts?: No Does the patient have difficulty concentrating, remembering, or making decisions?: Yes Patient able to express need for assistance with ADLs?: Yes Does the patient have difficulty dressing or bathing?: No Independently performs ADLs?: No Communication: Independent Dressing (OT): Independent Grooming: Independent Feeding: Independent Bathing: Needs assistance Is this a change from baseline?: Pre-admission baseline Toileting: Needs assistance Is this a change from baseline?: Pre-admission baseline In/Out Bed: Needs assistance Is this a change from baseline?: Pre-admission baseline Walks in Home: Needs assistance Is this a change from baseline?: Pre-admission baseline Does the patient have difficulty walking or climbing stairs?: Yes Weakness of Legs: Both Weakness of Arms/Hands: Both  Permission Sought/Granted   Permission granted to share information with : Yes, Verbal Permission Granted  Share Information with NAME: [name]  Permission granted to share info w AGENCY: SNFs  Permission granted to share info w Relationship: Son  Permission granted to share info w Contact Information: [phone]  Emotional Assessment Appearance:: Appears stated age Attitude/Demeanor/Rapport: Engaged Affect (typically observed): Accepting Orientation: : Oriented to Self,Oriented to Place,Oriented to  Time,Oriented to  Situation      Admission diagnosis:  Back pain [M54.9] Severe sepsis ([place]) [A41.9, R65.20] Infected skin ulcer with necrosis of bone ([place]) [S97.530, L08.9] Patient Active Problem List   Diagnosis Date Noted  . Osteomyelitis ([place]) 07/23/2020  .  Hardware complicating wound infection ([place]) 07/23/2020  . Back pain   . Pressure injury, unstageable ([place])   . Severe sepsis ([place]) 07/21/2020  . Hypocalcemia 07/21/2020  . Palliative care by specialist   . Goals of care, counseling/discussion   . Protein-calorie malnutrition, severe 05/25/2020  . Acute renal failure (ARF) ([place]) 05/24/2020  . 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 Hyperkalemia   . Edema of left upper extremity   . Hematoma   . Prolonged Q-T interval on ECG   . Acute blood loss anemia   . Transaminitis   . Chronic bilateral low back pain without sciatica   . Debility 05/13/2020  . Cellulitis of right hip 05/10/2020  . Elevated troponin 05/05/2020  . Near syncope 05/05/2020  . Pulmonary embolism ([place]) 05/05/2020  . Macrocytic anemia   . Hypokalemia   . Labile blood pressure   . Drug-induced hypotension   . History of hypertension   . First degree AV block   . Sleep disturbance   . Drug induced constipation   . Stage 2 chronic kidney disease   . Dysphagia, post-stroke   . Benign essential HTN   . Slow transit constipation   . Hypoalbuminemia due to protein-calorie malnutrition ([place])   . Allergic reaction caused by a drug (tPA w/ lip swelling) 02/21/2020  . 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 Dementia ([place]) 02/21/2020  . Bradycardia 02/21/2020  . AKI (acute kidney injury) ([place]) 02/21/2020  . 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 Dyslipidemia   . History of DVT (deep vein thrombosis)   . OSA (obstructive sleep apnea)   . Leukocytosis   . Stroke [facility]) L>R pontine and L cerebellar embolic infarcts, source unknown s/p tPA 02/16/2020  . Hx-TIA (transient ischemic attack)   . Hyperlipemia   . Hypertension   . Sleep apnea   . History of pulmonary embolus (PE)   . NSTEMI (non-ST elevated myocardial infarction) ([place]) 09/16/2019  . 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 Depression  07/29/2018  . Acute massive pulmonary embolism ([place]) 09/29/2016  . Acute encephalopathy 09/28/2016  . Left knee pain 09/28/2016  . Right leg swelling 09/28/2016  . Hilar mass 09/28/2016  . OSA on CPAP 09/28/2016  . HTN (hypertension) 09/28/2016  . Chronic pain 09/28/2016  . Cellulitis of left leg 06/14/2016  . Osteoarthritis of left knee 05/17/2016  . 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

## 2020-07-30 NOTE — Progress Notes (Addendum)
ANTICOAGULATION CONSULT NOTE  Pharmacy Consult:  Heparin Indication: recent PE, bilateral DVT   Allergies  Allergen Reactions  . T-Pa [Alteplase] Swelling    Does okay with it if premedicated with benadryl.  . Codeine Itching    Patient Measurements: Height: 5\' 2"  (157.5 cm) Weight: 87.3 kg (192 lb 7.4 oz) IBW/kg (Calculated) : 50.1 Heparin Dosing Weight: 68 kg  Vital Signs: Temp: 98.8 F (37.1 C) (12/19 0400) Temp Source: Oral (12/19 0400) BP: 132/67 (12/19 0400) Pulse Rate: 92 (12/19 0400)  Labs: Recent Labs    07/28/20 0140 07/29/20 0500 07/29/20 0908 07/29/20 1520 07/29/20 2359  HGB 9.5* 7.7* 7.6* 7.7* 7.9*  HCT 32.3* 25.2* 25.9* 26.1* 26.8*  PLT 311 257 255 245 252  CREATININE 0.43* 0.68  --   --   --     Estimated Creatinine Clearance: 57.6 mL/min (by C-G formula based on SCr of 0.68 mg/dL).   Assessment: 79 YOF on Eliquis PTA for recent PE and bilateral DVT s/p IVC filter admitted for severesepsis secondaryunstageable ulcer of thedistal thoracic/early lumbarspinewith exposed hardware consistent with osteomyelitis.   Patient has been on and off IV heparin and Eliquis for procedures and anemia.  Now to resume IV heparin to ensure patient tolerates anticoagulation prior to resuming Eliquis.  Last Eliquis dose was on 07/28/20 PM, so will use aPTT to guide heparin dosing.  Targetting lower end of goal range given hematoma and bleeding risk.  Goal of Therapy:  Heparin level 0.3-0.5 units/ml aPTT 66-84 sec  Monitor platelets by anticoagulation protocol: Yes   Plan:  Start heparin gtt at 950 units/hr as previously therapeutic on this rate Check 8 hr aPTT Daily heparin level, aPTT and CBC F/u with resuming Eliquis in AM if able  Ladarrian Asencio D. 09-18-1983, PharmD, BCPS, BCCCP 07/30/2020, 8:09 AM

## 2020-07-30 NOTE — Progress Notes (Signed)
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  Presented with sepsis secondary to wound infection. Underwent hardware removal on 07/27/2020. 12/18 hemoglobin dropped 2 g. Eliquis on hold. 12/19 on heparin. Currently plan is monitor for improvement in oral intake as well as hemoglobin stability on anticoagulation.  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   With tachypnea, tachycardia and leukocytosis meeting SIRS criteria. Sepsis physiology has resolved. Blood culture with no growth in 5 days. Initially patient was on Vancomycin and cefepime, which was discontinued on 12/12 as per ID recommendation.  Neurosurgery on board  S/P surgical debridement and removal of metal hardware on 12/16. ID recommends  6 days of antibiotics with IV cefazolin. PICC line inserted. Drains removed on 12/17. Continue dressing change per neurosurgery.  Exposed hardware H/o surgical decompression and arthrodesis from L1-S1 on 03/31/2006  H/o spondylitic stenosis with idiopathic scoliosis.  Per note on 06/19/20, patient has had decubitus ulcer with exposed hardware being treated at home with wet to dry dressings and santyl.   Per family ulcer was initially identified after discharge from the hospital.  History of submassivebilateral saddlePEand right and left DVT ( 9/20201).  SP IVC filter Compliant with Eliquis Patient was  briefly off of Eliquis for procedure and was on heparin. Started back on Eliquis post procedure. Due to anemia worsening on 12/18 Eliquis is back on hold. Hemoglobin stable on 12/19. Started on heparin. Right lower extremity Doppler 12/19 stable from prior. Left upper extremity Doppler negative for DVT, hematoma still present. Can transition back to Eliquis as long as hemoglobin remains stable and no active bleeding. Already has IVC filter therefore if the patient has drop in hemoglobin no more work-up or treatment need to be done and can simply just hold the anticoagulation indefinitely. Source of bleeding could be left upper extremity hematoma.  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 Patient appears to have anemia of chronic disease 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 intake.  (She eats baby food only) But had a drop from 9.5-7.7 on 12/18. Appears to be hemodilution as well as a component of postop blood loss anemia. H&H x3 since have been stable and no external bleeding has been reported. Monitor on heparin. Transfusion hemoglobin less than seven.  Elevated LFTs.  Likely secondary to sepsis, improved.  Incidental finding of bladder wall thickening. UA unremarkable  Constipation.   continuescheduled stool softeners and MiraLAX   Left upper extremity hematoma   Swollen in setting of hematoma from prior hospitalization secondary to TPA.  Continue extremity elevation.    Hypertension,  Blood pressure stable. On IV hydralazine.  On amlodipine, Lasix and lisinopril at home.    Dementia with behavior disturbances, stable. 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 Had loop recorder placed at that  time. no new focal deficits Currently on therapeutic anticoagulation therefore not on Plavix.  OSA. On CPAP at home. Currently holding in the hospital.  Obesity Placing the patient at high risk for poor outcomes with poor wound healing given her limited mobility. Body mass index is 35.2 kg/m.  Nutrition Problem: Increased nutrient needs Etiology: wound healing Interventions: Interventions: Ensure Enlive (each supplement provides 350kcal and 20 grams of protein),Prostat,Juven   Difficult IV access. Patient has a PICC line. Currently cannot use it for blood work for heparin infusion. We will attempt for stick.  Pressure Injury 07/22/20 Sacrum Mid Stage 1 -  Intact skin with non-blanchable redness of a localized area usually over a bony prominence. old, closed pressure injury coccyx. (Active)  07/22/20 1535  Location: Sacrum  Location Orientation: Mid  Staging: Stage 1 -  Intact skin with non-blanchable redness of a localized area usually over a bony prominence.  Wound Description (Comments): old, closed pressure injury coccyx.  Present on Admission: Yes    Diet: Dysphagia 1 diet.  Patient does not eat regular food and her family only eats baby food/pured food DVT Prophylaxis: Therapeutic Anticoagulation with Eliquis  SCD's Start: 07/27/20 2116    Advance goals of care discussion: Limited code  Family Communication: no family was present at bedside, at the time of interview.  Discussed with son on the phone.  Disposition:  Status is: Inpatient  Remains inpatient appropriate because:IV treatments appropriate due to intensity of illness or inability to take PO and Inpatient level of care appropriate due to severity of illness  Dispo:  Patient From: Home  Planned Disposition: Skilled Nursing Facility  Expected discharge date: 08/01/2020  Medically stable for discharge: No  Subjective: No acute complaint.  No nausea no vomiting.  No pain.  Breathing okay.  Minimal oral  intake.  Per family patient goes without a bowel movement for a week regularly.  Physical Exam:  General: Appear in mild distress, no Rash; Oral Mucosa Clear, moist. no Abnormal Neck Mass Or lumps, Conjunctiva normal  Referred to sleep in her left lateral side Cardiovascular: S1 and S2 Present, no Murmur, Respiratory: good respiratory effort, Bilateral Air entry present and CTA, no Crackles, no wheezes Abdomen: Bowel Sound present, Soft and no tenderness Extremities: Bilateral right more than left pedal edema, left upper extremity edema as well Neurology: alert and oriented to place and person affect flat in affect.  Chronic left-sided weakness, no new focal deficit Gait not checked due to patient safety concerns  Vitals:   07/30/20 0500 07/30/20 0720 07/30/20 1222 07/30/20 1559  BP:  128/69 (!) 148/69 (!) 143/64  Pulse:  84 78 79  Resp:  20 18 15   Temp:  97.9 F (36.6 C) (!) 97.3 F (36.3 C) 98.4 F (36.9 C)  TempSrc:  Oral Axillary Oral  SpO2:  98% 97% 100%  Weight: 87.3 kg     Height:        Intake/Output Summary (Last 24 hours) at 07/30/2020 1826 Last data filed at 07/30/2020 1800 Gross per 24 hour  Intake 1159.48 ml  Output 1350 ml  Net -190.52 ml   Filed Weights   07/28/20 0500 07/29/20 0500 07/30/20 0500  Weight: 86.1 kg 87.2 kg 87.3 kg    Data Reviewed: I have personally reviewed and interpreted daily labs, tele strips, imagings as discussed above. I reviewed all nursing notes, pharmacy notes, vitals, pertinent old records I have discussed plan of care as described above with RN and patient/family.  CBC: Recent  Labs  Lab 07/28/20 0140 07/29/20 0500 07/29/20 0908 07/29/20 1520 07/29/20 2359 07/30/20 0550  WBC 19.2* 12.5* 13.5* 13.5* 13.7* 13.8*  NEUTROABS 16.3*  --  10.7*  --   --   --   HGB 9.5* 7.7* 7.6* 7.7* 7.9* 7.5*  HCT 32.3* 25.2* 25.9* 26.1* 26.8* 25.8*  MCV 95.8 94.7 95.6 95.3 96.8 96.6  PLT 311 257 255 245 252 258   Basic Metabolic  Panel: Recent Labs  Lab 07/24/20 0418 07/25/20 0400 07/26/20 0103 07/27/20 0429 07/28/20 0140 07/29/20 0500 07/30/20 0550  NA 144   < > 142 140 140 139 142  K 3.7   < > 3.2* 3.3* 3.9 3.8 3.5  CL 109   < > 108 105 104 105 107  CO2 24   < > GLUCOSE 110*   < > 115* 91 113* 104* 121*  BUN 9   < > 9 7* 6* 9 15  CREATININE 0.45   < > 0.41* 0.34* 0.43* 0.68 0.53  CALCIUM 8.8*   < > 8.7* 8.8* 8.9 8.3* 8.6*  MG 2.1  --   --   --  1.9 1.8 2.0   < > = values in this interval not displayed.    Studies: VAS [place] LOWER EXTREMITY VENOUS (DVT)  Result Date: 07/30/2020  Lower Venous DVT Study Indications: Edema, and Recent history of DVT.  Risk Factors: Has IVC filter. Anticoagulation: Eliquis. Limitations: Edema and poor ultrasound/tissue interface. Comparison Study: Prior RLE venous studies from 07/22/20 and 04/16/20 are                   available for comparison Performing Technologist: Sherren Kerns RVS  Examination Guidelines: A complete evaluation includes B-mode imaging, spectral Doppler, color Doppler, and power Doppler as needed of all accessible portions of each vessel. Bilateral testing is considered an integral part of a complete examination. Limited examinations for reoccurring indications may be performed as noted. The reflux portion of the exam is performed with the patient in reverse Trendelenburg.  +---------+---------------+---------+-----------+----------+-------------------+ RIGHT    CompressibilityPhasicitySpontaneityPropertiesThrombus Aging      +---------+---------------+---------+-----------+----------+-------------------+ CFV      Partial        No       Yes                  Age Indeterminate   +---------+---------------+---------+-----------+----------+-------------------+ SFJ      Full                                                             +---------+---------------+---------+-----------+----------+-------------------+ FV Prox  Partial         No       No                   Age Indeterminate   +---------+---------------+---------+-----------+----------+-------------------+ FV Mid   Partial        No       No                   Age Indeterminate   +---------+---------------+---------+-----------+----------+-------------------+ FV DistalPartial        No       Yes  Age Indeterminate   +---------+---------------+---------+-----------+----------+-------------------+ PFV      Partial        No       No                   Age Indeterminate   +---------+---------------+---------+-----------+----------+-------------------+ POP                                                   Not well visualized +---------+---------------+---------+-----------+----------+-------------------+ PTV      None                                         Age Indeterminate   +---------+---------------+---------+-----------+----------+-------------------+ PERO     None                                         Age Indeterminate   +---------+---------------+---------+-----------+----------+-------------------+   +----+---------------+---------+-----------+----------+--------------+ LEFTCompressibilityPhasicitySpontaneityPropertiesThrombus Aging +----+---------------+---------+-----------+----------+--------------+ CFV Full           Yes      Yes                                 +----+---------------+---------+-----------+----------+--------------+     Summary: RIGHT: - 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 - Findings appear essentially unchanged compared to previous examination.  LEFT: - No evidence of common femoral vein obstruction.  *See table(s) above for measurements and observations. Electronically signed by Sherald Hess MD on 07/30/2020 at  3:48:19 PM.    Final    VAS [place] UPPER EXTREMITY VENOUS DUPLEX  Result Date: 07/30/2020 UPPER VENOUS STUDY  Indications: Edema Risk Factors: Known hematoma in left upper extremity. Limitations: Patient unable to turn head to right. Comparison       Prior LUE venous duplex from 06/09/20, is available for Study:           comparison Performing Technologist: Sherren Kerns RVS  Examination Guidelines: A complete evaluation includes B-mode imaging, spectral Doppler, color Doppler, and power Doppler as needed of all accessible portions of each vessel. Bilateral testing is considered an integral part of a complete examination. Limited examinations for reoccurring indications may be performed as noted.  Right Findings: +----------+------------+---------+-----------+----------+-------+ RIGHT     CompressiblePhasicitySpontaneousPropertiesSummary +----------+------------+---------+-----------+----------+-------+ IJV                      Yes       Yes                      +----------+------------+---------+-----------+----------+-------+ Subclavian               Yes       Yes                      +----------+------------+---------+-----------+----------+-------+  Left Findings: +----------+------------+---------+-----------+----------+---------------+ LEFT      CompressiblePhasicitySpontaneousProperties    Summary     +----------+------------+---------+-----------+----------+---------------+ IJV  Not visualized  +----------+------------+---------+-----------+----------+---------------+ Subclavian               Yes       Yes                              +----------+------------+---------+-----------+----------+---------------+ Axillary                 Yes       Yes                              +----------+------------+---------+-----------+----------+---------------+ Brachial      Full       Yes       Yes                               +----------+------------+---------+-----------+----------+---------------+ Radial                                              patent by color +----------+------------+---------+-----------+----------+---------------+ Ulnar                                               Not visualized  +----------+------------+---------+-----------+----------+---------------+ Cephalic      Full                                                  +----------+------------+---------+-----------+----------+---------------+ Basilic       Full                                                  +----------+------------+---------+-----------+----------+---------------+  Summary:  Right: No evidence of thrombosis in the subclavian.  Left: No evidence of deep vein thrombosis in the upper extremity. Hematoma found last exam, 05/20/20, is still present.  *See table(s) above for measurements and observations.  Diagnosing physician: Sherald Hess MD Electronically signed by Sherald Hess MD on 07/30/2020 at 3:47:09 PM.    Final     Scheduled Meds: . (feeding supplement) PROSource Plus  30 mL Oral BID BM  . busPIRone  15 mg Oral BID  . Chlorhexidine Gluconate Cloth  6 each Topical Daily  . donepezil  10 mg Oral QHS  . dronabinol  2.5 mg Oral BID AC  . feeding supplement  237 mL Oral QID  . ferrous sulfate  325 mg Oral BID WC  . linaclotide  145 mcg Oral Daily  . loratadine  10 mg Oral Daily  . memantine  28 mg Oral QHS  . multivitamin with minerals  1 tablet Oral Daily  . nutrition supplement (JUVEN)  1 packet Oral BID BM  . polyethylene glycol  17 g Oral Daily  . senna-docusate  1 tablet Oral BID  . sodium chloride flush  10-40 mL Intracatheter Q12H   Continuous Infusions: .  ceFAZolin (ANCEF) IV 2 g (07/30/20 1232)  . heparin 950  Units/hr (07/30/20 1800)  . lactated ringers 10 mL/hr at 07/30/20 1604   PRN Meds: acetaminophen **OR** acetaminophen, bisacodyl, bisacodyl, cycloSPORINE,  menthol-cetylpyridinium **OR** phenol, morphine injection, oxyCODONE, prochlorperazine, sodium chloride flush, sodium phosphate  Time spent: 35 minutes  Author: Lynden Oxford, MD [facility] 07/30/2020 6:26 PM  To reach On-call, see care teams to locate the attending and reach out via [email or web address]. Between 7PM-7AM, please contact night-coverage If you still have difficulty reaching the attending provider, please page the [facility] (Director on Call) for [facility] on amion for assistance.

## 2020-07-30 NOTE — Progress Notes (Signed)
VASCULAR LAB    Right lower extremity venous duplex has been performed.  See CV proc for preliminary results.   Jovian Lembcke, RVT 07/30/2020, 2:48 PM

## 2020-07-30 NOTE — NC FL2 (Signed)
[place] MEDICAID FL2 LEVEL OF CARE SCREENING TOOL     IDENTIFICATION  Patient Name: [name] Birthdate: 1941/07/04 Sex: female Admission Date (Current Location): 07/21/2020  [facility] and [place] Number:  Producer, television/film/video and Address:  [facility] [place], [address] [place], [place] [address]      Provider Number: [id number]  Attending Physician Name and Address:  Rolly Salter, MD  Relative Name and Phone Number:  [name] [phone]    Current Level of Care: Hospital Recommended Level of Care: Skilled Nursing Facility Prior Approval Number:    Date Approved/Denied:   PASRR Number: [id number]  Discharge Plan: SNF    Current Diagnoses: Patient Active Problem List   Diagnosis Date Noted  . Osteomyelitis (HCC) 07/23/2020  . Hardware complicating wound infection (HCC) 07/23/2020  . Back pain   . Pressure injury, unstageable (HCC)   . Severe sepsis (HCC) 07/21/2020  . Hypocalcemia 07/21/2020  . Palliative care by specialist   . Goals of care, counseling/discussion   . Protein-calorie malnutrition, severe 05/25/2020  . Acute renal failure (ARF) (HCC) 05/24/2020  . 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 Hyperkalemia   . Edema of left upper extremity   . Hematoma   . Prolonged Q-T interval on ECG   . Acute blood loss anemia   . Transaminitis   . Chronic bilateral low back pain without sciatica   . Debility 05/13/2020  . Cellulitis of right hip 05/10/2020  . Elevated troponin 05/05/2020  . Near syncope 05/05/2020  . Pulmonary embolism (HCC) 05/05/2020  . Macrocytic anemia   . Hypokalemia   . Labile blood pressure   . Drug-induced hypotension   . History of hypertension   . First degree AV block   . Sleep disturbance   . Drug induced constipation   . Stage 2 chronic kidney disease   . Dysphagia, post-stroke   . Benign essential HTN   . Slow transit constipation   .  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 Allergic reaction caused by a drug (tPA w/ lip swelling) 02/21/2020  . 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 Dementia (HCC) 02/21/2020  . Bradycardia 02/21/2020  . AKI (acute kidney injury) (HCC) 02/21/2020  . 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 Dyslipidemia   . History of DVT (deep vein thrombosis)   . OSA (obstructive sleep apnea)   . Leukocytosis   . Stroke [facility]) L>R pontine and L cerebellar embolic infarcts, source unknown s/p tPA 02/16/2020  . Hx-TIA (transient ischemic attack)   . Hyperlipemia   . Hypertension   . Sleep apnea   . History of pulmonary embolus (PE)   . NSTEMI (non-ST elevated myocardial infarction) (HCC) 09/16/2019  . 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 Depression 07/29/2018  . Acute massive pulmonary embolism (HCC) 09/29/2016  . Acute encephalopathy 09/28/2016  . Left knee pain 09/28/2016  . Right leg swelling 09/28/2016  . Hilar mass 09/28/2016  . OSA on CPAP 09/28/2016  . HTN (hypertension) 09/28/2016  . Chronic pain 09/28/2016  . Cellulitis of left leg 06/14/2016  . Osteoarthritis of left knee 05/17/2016  . 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    Orientation RESPIRATION BLADDER Height & Weight     Self,Time,Situation,Place  O2 (2l) External catheter,Incontinent Weight: 192 lb 7.4 oz (87.3 kg) Height:  5\' 2"  (157.5 cm)  BEHAVIORAL SYMPTOMS/MOOD NEUROLOGICAL BOWEL NUTRITION STATUS  Incontinent Diet (see discharge summart)  AMBULATORY STATUS COMMUNICATION OF NEEDS Skin   Extensive Assist Verbally  (vertebral column mid, posterior upper open wounds stage 4 from spinal infusion surgery)                       Personal Care Assistance Level of Assistance  Total care,Bathing,Feeding,Dressing Bathing Assistance: Maximum assistance Feeding assistance: Limited assistance Dressing Assistance: Limited assistance Total Care Assistance: Maximum  assistance   Functional Limitations Info  Sight,Hearing,Speech Sight Info: Impaired Hearing Info: Adequate Speech Info: Adequate    SPECIAL CARE FACTORS FREQUENCY  PT (By licensed PT),OT (By licensed OT)     PT Frequency: 5x per week OT Frequency: 5x per week            Contractures Contractures Info: Not present    Additional Factors Info  Code Status,Allergies Code Status Info: Partial Allergies Info: T-pa Alteplase, [place]           Current Medications (07/30/2020):  This is the current hospital active medication list Current Facility-Administered Medications  Medication Dose Route Frequency Provider Last Rate Last Admin  . (feeding supplement) PROSource Plus liquid 30 mL  30 mL Oral BID BM Rolly Salter, MD   30 mL at 07/30/20 1532  . acetaminophen (TYLENOL) tablet 650 mg  650 mg Oral Q6H PRN Roberto Scales D, MD   650 mg at 07/28/20 1211   Or  . acetaminophen (TYLENOL) suppository 650 mg  650 mg Rectal Q6H PRN Roberto Scales D, MD      . bisacodyl (DULCOLAX) EC tablet 5 mg  5 mg Oral Daily PRN Rolly Salter, MD      . bisacodyl (DULCOLAX) suppository 10 mg  10 mg Rectal Daily PRN Barnett Abu, MD      . busPIRone (BUSPAR) tablet 15 mg  15 mg Oral BID Roberto Scales D, MD   15 mg at 07/30/20 0914  . ceFAZolin (ANCEF) IVPB 2g/100 mL premix  2 g Intravenous Q8H Kathlynn Grate, DO 200 mL/hr at 07/30/20 1232 2 g at 07/30/20 1232  . Chlorhexidine Gluconate Cloth 2 % PADS 6 each  6 each Topical Daily Rolly Salter, MD   6 each at 07/30/20 1000  . cycloSPORINE (RESTASIS) 0.05 % ophthalmic emulsion 1 drop  1 drop Both Eyes Daily PRN Roberto Scales D, MD      . donepezil (ARICEPT) tablet 10 mg  10 mg Oral QHS Roberto Scales D, MD   10 mg at 07/29/20 2156  . dronabinol (MARINOL) capsule 2.5 mg  2.5 mg Oral BID AC Roberto Scales D, MD   2.5 mg at 07/30/20 1230  . feeding supplement (ENSURE ENLIVE / ENSURE PLUS) liquid 237 mL  237 mL Oral QID Rolly Salter, MD   237  mL at 07/30/20 1533  . ferrous sulfate tablet 325 mg  325 mg Oral BID WC Rolly Salter, MD   325 mg at 07/30/20 0913  . heparin ADULT infusion 100 units/mL (25000 units/236mL sodium chloride 0.45%)  950 Units/hr Intravenous Continuous Gerilyn Nestle, RPH 9.5 mL/hr at 07/30/20 3614 950 Units/hr at 07/30/20 0937  . lactated ringers infusion   Intravenous Continuous Barnett Abu, MD 10 mL/hr at 07/29/20 0218 New Bag at 07/29/20 0218  . linaclotide (LINZESS) capsule 145 mcg  145 mcg Oral Daily Roberto Scales D, MD   145 mcg at 07/30/20 0934  . loratadine (CLARITIN) tablet 10 mg  10 mg Oral Daily Nettey,  Elder Cyphers, MD   10 mg at 07/30/20 0914  . memantine (NAMENDA XR) 24 hr capsule 28 mg  28 mg Oral QHS Roberto Scales D, MD   28 mg at 07/29/20 2157  . menthol-cetylpyridinium (CEPACOL) lozenge 3 mg  1 lozenge Oral PRN Barnett Abu, MD       Or  . phenol (CHLORASEPTIC) mouth spray 1 spray  1 spray Mouth/Throat PRN Barnett Abu, MD      . morphine 2 MG/ML injection 2 mg  2 mg Intravenous Q2H PRN Barnett Abu, MD   2 mg at 07/29/20 2203  . multivitamin with minerals tablet 1 tablet  1 tablet Oral Daily Rolly Salter, MD   1 tablet at 07/30/20 0914  . nutrition supplement (JUVEN) (JUVEN) powder packet 1 packet  1 packet Oral BID BM Rolly Salter, MD   1 packet at 07/30/20 1533  . oxyCODONE (Oxy IR/ROXICODONE) immediate release tablet 7.5 mg  7.5 mg Oral QID PRN Roberto Scales D, MD   7.5 mg at 07/30/20 1549  . polyethylene glycol (MIRALAX / GLYCOLAX) packet 17 g  17 g Oral Daily Rolly Salter, MD   17 g at 07/30/20 0914  . prochlorperazine (COMPAZINE) injection 10 mg  10 mg Intravenous Q6H PRN Barnett Abu, MD   10 mg at 07/27/20 0027  . senna-docusate (Senokot-S) tablet 1 tablet  1 tablet Oral BID Roberto Scales D, MD   1 tablet at 07/30/20 0914  . sodium chloride flush (NS) 0.9 % injection 10-40 mL  10-40 mL Intracatheter Q12H Rolly Salter, MD   10 mL at 07/30/20 0916  . sodium chloride flush  (NS) 0.9 % injection 10-40 mL  10-40 mL Intracatheter PRN Rolly Salter, MD      . sodium phosphate (FLEET) 7-19 GM/118ML enema 1 enema  1 enema Rectal Once PRN Barnett Abu, MD         Discharge Medications: Please see discharge summary for a list of discharge medications.  Relevant Imaging Results:  Relevant Lab Results:   Additional Information SSN# [id number] Patient has been vaccinated, ingle lumen PICC  Patrice Paradise, LCSW

## 2020-07-30 NOTE — Progress Notes (Signed)
Neurosurgery Service Progress Note  Subjective: No acute events overnight   Objective: Vitals:   07/30/20 0400 07/30/20 0500 07/30/20 0720 07/30/20 1222  BP: 132/67  128/69 (!) 148/69  Pulse: 92  84 78  Resp: 20  20 18   Temp: 98.8 F (37.1 C)  97.9 F (36.6 C) (!) 97.3 F (36.3 C)  TempSrc: Oral  Oral Axillary  SpO2: 98%  98% 97%  Weight:  87.3 kg    Height:        Physical Exam: Eyes open spontaneously, O x zero, answers some questions yes/no, FCx4, unable to perform strength testing - was getting LUE / BLE ultrasound  Assessment & Plan: 79 y.o. woman w/ exposed lumbar hardware s/p removal and wound revision, recovering well.  -no change in neurosurgical plan of care  96  07/30/20 12:47 PM

## 2020-07-30 NOTE — Progress Notes (Signed)
VASCULAR LAB    Left upper extremity  has been performed.  See CV proc for preliminary results.   Jamarri Vuncannon, RVT 07/30/2020, 3:03 PM

## 2020-07-30 NOTE — Progress Notes (Signed)
ANTICOAGULATION CONSULT NOTE  Pharmacy Consult:  Heparin Indication: recent PE, bilateral DVT   Allergies  Allergen Reactions  . T-Pa [Alteplase] Swelling    Does okay with it if premedicated with benadryl.  . Codeine Itching    Patient Measurements: Height: 5\' 2"  (157.5 cm) Weight: 87.3 kg (192 lb 7.4 oz) IBW/kg (Calculated) : 50.1 Heparin Dosing Weight: 68 kg  Vital Signs: Temp: 98.4 F (36.9 C) (12/19 1559) Temp Source: Oral (12/19 1559) BP: 143/64 (12/19 1559) Pulse Rate: 79 (12/19 1559)  Labs: Recent Labs    07/28/20 0140 07/29/20 0500 07/29/20 0908 07/29/20 1520 07/29/20 2359 07/30/20 0550 07/30/20 1730  HGB 9.5* 7.7*   < > 7.7* 7.9* 7.5*  --   HCT 32.3* 25.2*   < > 26.1* 26.8* 25.8*  --   PLT 311 257   < > 245 252 258  --   APTT  --   --   --   --   --   --  71*  HEPARINUNFRC  --   --   --   --   --   --  1.82*  CREATININE 0.43* 0.68  --   --   --  0.53  --    < > = values in this interval not displayed.    Estimated Creatinine Clearance: 57.6 mL/min (by C-G formula based on SCr of 0.53 mg/dL).   Assessment: 79 YOF on Eliquis PTA for recent PE and bilateral DVT s/p IVC filter admitted for severesepsis secondaryunstageable ulcer of thedistal thoracic/early lumbarspinewith exposed hardware consistent with osteomyelitis.   Patient has been on and off IV heparin and Eliquis for procedures and anemia.  Now to resume IV heparin to ensure patient tolerates anticoagulation prior to resuming Eliquis.  Last Eliquis dose was on 07/28/20 PM, so will use aPTT to guide heparin dosing. Targetting lower end of goal range given hematoma and bleeding risk.   Heparin level remains altered by DOAC, aPTT is therapeutic at 71 seconds.  Goal of Therapy:  Heparin level 0.3-0.5 units/ml aPTT 66-84 sec  Monitor platelets by anticoagulation protocol: Yes   Plan:  Continue heparin 950 units/h Daily heparin level, aPTT, CBC   07/30/20, PharmD, BCPS,  [facility] Clinical Pharmacist [phone] Please check AMION for all [facility] numbers 07/30/2020

## 2020-07-30 NOTE — Progress Notes (Signed)
Spoke with [name] about aPTT results being elevated; no change in heparin gtt rate at this time.

## 2020-07-30 NOTE — Progress Notes (Signed)
Civil engineer, contracting [facility]) Community Based Palliative Care       This patient is enrolled in our palliative care services in the community.   [facility] palliative NP asks that inpatient goals of care conversation be had with pt's family.     [facility] will continue to follow for any discharge planning needs and to coordinate continuation of palliative care.    If you have questions or need assistance, please call [phone] or contact the hospital Liaison listed on AMION.     Thank you for the opportunity to participate in this patients care.     Gillian Scarce, BSN, RN [facility] Liaison   [phone]

## 2020-07-31 LAB — COMPREHENSIVE METABOLIC PANEL
ALT: 12 U/L (ref 0–44)
AST: 23 U/L (ref 15–41)
Albumin: 1.6 g/dL — ABNORMAL LOW (ref 3.5–5.0)
Alkaline Phosphatase: 49 U/L (ref 38–126)
Anion gap: 10 (ref 5–15)
BUN: 19 mg/dL (ref 8–23)
CO2: 25 mmol/L (ref 22–32)
Calcium: 8.5 mg/dL — ABNORMAL LOW (ref 8.9–10.3)
Chloride: 106 mmol/L (ref 98–111)
Creatinine, Ser: 0.43 mg/dL — ABNORMAL LOW (ref 0.44–1.00)
GFR, Estimated: >60 mL/min (ref >60)
Glucose, Bld: 107 mg/dL — ABNORMAL HIGH (ref 70–99)
Potassium: 3.5 mmol/L (ref 3.5–5.1)
Sodium: 141 mmol/L (ref 135–145)
Total Bilirubin: 0.6 mg/dL (ref 0.3–1.2)
Total Protein: 5.2 g/dL — ABNORMAL LOW (ref 6.5–8.1)

## 2020-07-31 LAB — CBC
HCT: 25.1 % — ABNORMAL LOW (ref 36.0–46.0)
Hemoglobin: 7.4 g/dL — ABNORMAL LOW (ref 12.0–15.0)
MCH: 28.7 pg (ref 26.0–34.0)
MCHC: 29.5 g/dL — ABNORMAL LOW (ref 30.0–36.0)
MCV: 97.3 fL (ref 80.0–100.0)
Platelets: 212 10*3/uL (ref 150–400)
RBC: 2.58 MIL/uL — ABNORMAL LOW (ref 3.87–5.11)
RDW: 18.6 % — ABNORMAL HIGH (ref 11.5–15.5)
WBC: 13.7 10*3/uL — ABNORMAL HIGH (ref 4.0–10.5)
nRBC: 0.2 % (ref 0.0–0.2)

## 2020-07-31 LAB — APTT: aPTT: 67 seconds — ABNORMAL HIGH (ref 24–36)

## 2020-07-31 LAB — HEPARIN LEVEL (UNFRACTIONATED): Heparin Unfractionated: 1.36 IU/mL — ABNORMAL HIGH (ref 0.30–0.70)

## 2020-07-31 LAB — MAGNESIUM: Magnesium: 2 mg/dL (ref 1.7–2.4)

## 2020-07-31 NOTE — TOC Progression Note (Signed)
Transition of Care [facility]) - Progression Note    Patient Details  Name: [name] MRN: [id number] Date of Birth: 1941-02-03  Transition of Care [facility]) CM/SW Contact  Eduard Roux, [place] Phone Number: 07/31/2020, 10:45 AM  Clinical Narrative:     Patient has no bed offers.  CSW will continue to follow and assist with discharge planning.  Antony Blackbird, MSW, LCSW Clinical Social Worker   Expected Discharge Plan: Skilled Nursing Facility Barriers to Discharge: SNF Pending bed offer  Expected Discharge Plan and Services Expected Discharge Plan: Skilled Nursing Facility       Living arrangements for the past 2 months: Single Family Home                                       Social Determinants of Health (SDOH) Interventions    Readmission Risk Interventions No flowsheet data found.

## 2020-07-31 NOTE — Progress Notes (Signed)
ANTICOAGULATION CONSULT NOTE  Pharmacy Consult:  Heparin Indication: recent PE, bilateral DVT   Allergies  Allergen Reactions  . T-Pa [Alteplase] Swelling    Does okay with it if premedicated with benadryl.  . Codeine Itching    Patient Measurements: Height: 5\' 2"  (157.5 cm) Weight: 73.1 kg (161 lb 2.5 oz) IBW/kg (Calculated) : 50.1 Heparin Dosing Weight: 68 kg  Vital Signs: Temp: 98 F (36.7 C) (12/20 0725) Temp Source: Oral (12/20 0353) BP: 131/61 (12/20 0725) Pulse Rate: 86 (12/20 0725)  Labs: Recent Labs    07/29/20 0500 07/29/20 0908 07/29/20 1520 07/29/20 2359 07/30/20 0550 07/30/20 1730 07/31/20 0158  HGB 7.7*   < > 7.7* 7.9* 7.5*  --   --   HCT 25.2*   < > 26.1* 26.8* 25.8*  --   --   PLT 257   < > 245 252 258  --   --   APTT  --   --   --   --   --  71* 67*  HEPARINUNFRC  --   --   --   --   --  1.82* 1.36*  CREATININE 0.68  --   --   --  0.53  --  0.43*   < > = values in this interval not displayed.    Estimated Creatinine Clearance: 52.5 mL/min (A) (by C-G formula based on SCr of 0.43 mg/dL (L)).   Assessment: 79 YOF on Eliquis PTA for recent PE and bilateral DVT s/p IVC filter admitted for severesepsis secondaryunstageable ulcer of thedistal thoracic/early lumbarspinewith exposed hardware consistent with osteomyelitis.   Patient has been on and off IV heparin and Eliquis for procedures and anemia.  Now to resume IV heparin to ensure patient tolerates anticoagulation prior to resuming Eliquis.  Last Eliquis dose was on 07/28/20 PM, so will use aPTT to guide heparin dosing. Targetting lower end of goal range given hematoma and bleeding risk.   Heparin level remains due to recent Eliquis >> using aPTT to monitor IV Heparin therapy until correlating.   aPTT is therapeutic at 67 seconds - trending down at low end of goal range. Hgb low at 7.4-stable for last 3 days. Platelets are within normal limits. No bleeding noted.   Goal of Therapy:  Heparin  level 0.3-0.5 units/ml aPTT 66-84 sec  Monitor platelets by anticoagulation protocol: Yes   Plan:  Incresae heparin slightly to 1000 units/hr (56mL/hr) to keep in goal.  Daily heparin level, aPTT, CBC  9m, PharmD, BCPS, BCCCP Clinical Pharmacist Please refer to [facility] for [facility] Pharm[facility] numbers 07/31/2020

## 2020-07-31 NOTE — Progress Notes (Signed)
Physical Therapy Treatment Patient Details Name: [name] MRN: [id number] DOB: 1941-02-14 Today's Date: 07/31/2020    History of Present Illness [name] is an 79 y.o. 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 Pt also noted with exposed hardward. Pt underwent I&D of wound and removal of spinal fusion hardware on 12/16.    PT Comments    The pt was willing to participate in PT session at this time despite reports of significant pain in her back and RLE. The pt was able to demo good activation for a series of exercises with her LLE, but requires significant assist to complete exercises and ROM with her RLE due to pain. The pt was also able to work through a series of UE strengthening exercises with goal of promoting ROM and core activation to initiate reaching for bed mobility and UE strength to grasp and raise cup to her mouth without assist. The pt will continue to benefit from skilled PT to progress ROM and strength generally, as well as to reduce caregiver burden in next setting.     Follow Up Recommendations  SNF;Supervision/Assistance - 24 hour     Equipment Recommendations   (defer to post acute)    Recommendations for Other Services       Precautions / Restrictions Precautions Precautions: Fall Precaution Comments: L-sided weakness from prior CVA Restrictions Weight Bearing Restrictions: No    Mobility  Bed Mobility Overal bed mobility: Needs Assistance Bed Mobility: Rolling Rolling: Total assist         General bed mobility comments: totalA to complete roll at this time, worked  on reaching and UE movement through session, further mobiltiy deferred at this time due to pt pain and lack of enough assistance to complete mobility safely.      Balance Overall balance assessment: Needs assistance Sitting-balance support: Bilateral upper extremity supported;Feet supported Sitting balance-Leahy Scale: Zero Sitting balance - Comments: Max A to maintain sitting balance with L lateral lean                                    Cognition Arousal/Alertness: Awake/alert Behavior During Therapy: Flat affect Overall Cognitive Status: No family/caregiver present to determine baseline cognitive functioning Area of Impairment: Orientation;Memory;Following commands;Safety/judgement;Awareness;Problem solving                 Orientation Level: Situation;Time   Memory: Decreased short-term memory Following Commands: Follows one step commands with increased time Safety/Judgement: Decreased awareness of safety;Decreased awareness of deficits Awareness: Intellectual Problem Solving: Slow processing;Difficulty sequencing;Decreased initiation;Requires verbal cues;Requires tactile cues General Comments: Pt with flat affect, intermittently focused gaze during session. Pt with some memory deficits, unaware of deficits, slow processing with following of commands. focused on pain with movement      Exercises General Exercises - Upper Extremity Shoulder Flexion: AAROM;Both;10 reps;Supine Shoulder Horizontal ABduction: AAROM;Both;10 reps;Supine Shoulder Horizontal ADduction: AAROM;Both;10 reps;Supine Elbow Flexion: AAROM;Both;10 reps;Supine Elbow Extension: AAROM;Both;10 reps;Supine General Exercises - Lower Extremity Ankle Circles/Pumps: AAROM;Both;15 reps;Supine Quad Sets: AAROM;Both;10 reps;Supine Heel Slides: AAROM;Both;10 reps;Supine    General Comments General comments (skin  integrity, edema, etc.): VSS through session. pt unable to lift drink or press call  bell, given lighter cups and soft-touch call bell for greater independence      Pertinent Vitals/Pain Pain Assessment: Faces Faces Pain Scale: Hurts little more Pain Location: back, feet, RLE, L elbow Pain Descriptors / Indicators: Discomfort;Grimacing;Guarding Pain Intervention(s): Limited activity within patient's tolerance;Monitored during session;Repositioned           PT Goals (current goals can now be found in the care plan section) Acute Rehab PT Goals Patient Stated Goal: reduce pain PT Goal Formulation: With patient Time For Goal Achievement: 08/11/20 Potential to Achieve Goals: Fair Progress towards PT goals: Progressing toward goals    Frequency    Min 3X/week      PT Plan Current plan remains appropriate       AM-PAC PT "6 Clicks" Mobility   Outcome Measure  Help needed turning from your back to your side while in a flat bed without using bedrails?: Total Help needed moving from lying on your back to sitting on the side of a flat bed without using bedrails?: Total Help needed moving to and from a bed to a chair (including a wheelchair)?: Total Help needed standing up from a chair using your arms (e.g., wheelchair or bedside chair)?: Total Help needed to walk in hospital room?: Total Help needed climbing 3-5 steps with a railing? : Total 6 Click Score: 6    End of Session Equipment Utilized During Treatment: Oxygen Activity Tolerance: Patient limited by pain;Patient limited by fatigue Patient left: in bed;with call bell/phone within reach;with bed alarm set Nurse Communication: Mobility status;Need for lift equipment PT Visit Diagnosis: Other abnormalities of gait and mobility (R26.89);Muscle weakness (generalized) (M62.81);Pain Pain - Right/Left: Right Pain - part of body: Knee     Time: 0925-1002 PT Time Calculation (min) (ACUTE ONLY): 37 min  Charges:  $Therapeutic Exercise: 8-22 mins $Therapeutic Activity: 8-22 mins                      Rolm Baptise, PT, DPT   Acute Rehabilitation Department Pager #: [phone]   [name] 07/31/2020, 12:55 PM

## 2020-07-31 NOTE — Progress Notes (Signed)
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  Presented with sepsis secondary to wound infection. Underwent hardware removal on 07/27/2020. 12/18 hemoglobin dropped 2 g. Eliquis on hold. 12/19 on heparin. Currently plan is monitor for improvement in oral intake as well as hemoglobin stability on anticoagulation.  Interval History:  No events overnight.  Resting in bed comfortable and denies any significant pain. Discussed plan of antibiotics with her and she understands.  She had oxygen on this morning but states she does not wear oxygen at home.  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 With tachypnea, tachycardia and leukocytosis meeting SIRS criteria. Sepsis physiology has resolved. Blood culture with no growth in 5days. Initially patient was onVancomycin and cefepime, which was discontinued on 12/12 as per ID recommendation. Now on Ancef Neurosurgery on board  S/P surgical debridement and removal of metal hardware on 12/16. ID recommends 6 weeks of antibiotics with IV cefazolin. PICC line inserted. Drains removed on 12/17. Continue dressing change daily and painting incision with betadine per  neurosurgery rec's  Exposed hardware H/o surgical decompression and arthrodesis from L1-S1 on 03/31/2006  H/o spondylitic stenosis with idiopathic scoliosis.  Per note on 06/19/20, patient has had decubitus ulcer with exposed hardware being treated at home with wet to dry dressings and santyl. Per family ulcer was initially identified after discharge from the hospital.  History of submassivebilateral saddlePEand right and left DVT ( 9/20201).  SP IVC filter Compliant with Eliquis Patient was briefly off of Eliquis for procedure and was on heparin. Started back on Eliquis post procedure. Due to anemia worsening on 12/18 Eliquis is back on hold. Hemoglobin stable on 12/19. Started on heparin. Right lower extremity Doppler 12/19 stable from prior. Left upper extremity Doppler negative for DVT, hematoma still present. Can transition back to Eliquis as long as hemoglobin remains stable and no active bleeding. Very slow downtrend (still multifactorial)....will give Hgb at least 1 more day to watch trend before transitioning Already has IVC filter therefore if the patient has drop in hemoglobin no more work-up or treatment need to be done and can simply just hold the anticoagulation indefinitely. Source of bleeding could be left upper extremity hematoma.  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 Patient appears to have anemia of chronic disease 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 intake Appears to be hemodilution as well as a component of postop blood loss anemia. Monitor on heparin  Elevated LFTs.  Likely secondary to sepsis, improved.  Incidental finding of bladder wall thickening. UA unremarkable  Constipation.  continuescheduled stool softeners and MiraLAX   Left upper extremity hematoma  Swollen in setting of hematoma from prior hospitalization secondary to TPA.  Continue extremity  elevation.   Hypertension,  Blood pressure stable. On IV  hydralazine.  On amlodipine, Lasix and lisinopril at home.   Dementia with behavior disturbances, stable. 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 Had loop recorder placed at that time. no new focal deficits Currently on therapeutic anticoagulation therefore not on Plavix.  OSA. On CPAP at home. Currently holding in the hospital.  Obesity Placing the patient at high risk for poor outcomes with poor wound healing given her limited mobility. Body mass index is 35.2 kg/m.  Nutrition Problem: Increased nutrient needs Etiology: wound healing Interventions: Interventions: Ensure Enlive (each supplement provides 350kcal and 20 grams of protein),Prostat,Juven   Difficult IV access. Patient has a PICC line. Currently cannot use it for blood work for heparin infusion. We will attempt for stick.  Pressure Injury 07/22/20 Sacrum Mid Stage 1 -  Intact skin with non-blanchable redness of a localized area usually over a bony prominence. old, closed pressure injury coccyx. (Active)  07/22/20 1535  Location: Sacrum  Location Orientation: Mid  Staging: Stage 1 -  Intact skin with non-blanchable redness of a localized area usually over a bony prominence.  Wound Description (Comments): old, closed pressure injury coccyx.  Present on Admission: Yes     Antimicrobials: Ancef  DVT prophylaxis: Heparin gtt Code Status: Partial Family Communication:  Disposition Plan: Status is: Inpatient  Remains inpatient appropriate because:IV treatments appropriate due to intensity of illness or inability to take PO and Inpatient level of care appropriate due to severity of illness   Dispo:  Patient From: Home  Planned Disposition: Skilled Nursing  Facility  Expected discharge date: 08/01/2020  Medically stable for discharge: No        Objective: Blood pressure 113/64, pulse 80, temperature 97.9 F (36.6 C), resp. rate 20, height 5\' 2"  (1.575 m), weight 73.1 kg, SpO2 98 %.  Examination: General appearance: alert, cooperative and no distress Head: Normocephalic, without obvious abnormality, atraumatic Eyes: EOMI Lungs: clear to auscultation bilaterally Heart: regular rate and rhythm and S1, S2 normal Abdomen: normal findings: bowel sounds normal and soft, non-tender Extremities: no edema Skin: mobility and turgor normal Neurologic: LUE 1/5, 5/5 all others  Consultants:   Neurosurgery  Procedures:     Data Reviewed: I have personally reviewed following labs and imaging studies Results for orders placed or performed during the hospital encounter of 07/21/20 (from the past 24 hour(s))  Comprehensive metabolic panel     Status: Abnormal   Collection Time: 07/31/20  1:58 AM  Result Value Ref Range   Sodium 141 135 - 145 mmol/L   Potassium 3.5 3.5 - 5.1 mmol/L   Chloride 106 98 - 111 mmol/L   CO2 25 22 - 32 mmol/L   Glucose, Bld 107 (H) 70 - 99 mg/dL   BUN 19 8 - 23 mg/dL   Creatinine, Ser 2.42 (L) 0.44 - 1.00 mg/dL   Calcium 8.5 (L) 8.9 - 10.3 mg/dL   Total Protein 5.2 (L) 6.5 - 8.1 g/dL   Albumin 1.6 (L) 3.5 - 5.0 g/dL   AST 23 15 - 41 U/L   ALT 12 0 - 44 U/L   Alkaline Phosphatase 49 38 - 126 U/L   Total Bilirubin 0.6 0.3 - 1.2 mg/dL   GFR, Estimated >68 >34 mL/min   Anion gap 10 5 - 15  Magnesium     Status: None   Collection Time: 07/31/20  1:58 AM  Result Value Ref Range   Magnesium 2.0 1.7 - 2.4 mg/dL  APTT     Status: Abnormal   Collection Time: 07/31/20  1:58 AM  Result Value Ref Range   aPTT 67 (H) 24 - 36 seconds  Heparin level (unfractionated)     Status: Abnormal   Collection Time: 07/31/20  1:58 AM  Result Value Ref Range   Heparin Unfractionated 1.36 (H) 0.30 - 0.70 IU/mL  CBC     Status:  Abnormal   Collection Time: 07/31/20  2:10 AM  Result Value Ref Range   WBC 13.7 (H) 4.0 - 10.5 K/uL   RBC 2.58 (L) 3.87 - 5.11 MIL/uL   Hemoglobin 7.4 (L) 12.0 - 15.0 g/dL   HCT 16.5 (L) 79.0 - 38.3 %   MCV 97.3 80.0 - 100.0 fL   MCH 28.7 26.0 - 34.0 pg   MCHC 29.5 (L) 30.0 - 36.0 g/dL   RDW 33.8 (H) 32.9 - 19.1 %   Platelets 212 150 - 400 K/uL   nRBC 0.2 0.0 - 0.2 %    No results found for this or any previous visit (from the past 240 hour(s)).   Radiology Studies: VAS [place] LOWER EXTREMITY VENOUS (DVT)  Result Date: 07/30/2020  Lower Venous DVT Study Indications: Edema, and Recent history of DVT.  Risk Factors: Has IVC filter. Anticoagulation: Eliquis. Limitations: Edema and poor ultrasound/tissue interface. Comparison Study: Prior RLE venous studies from 07/22/20 and 04/16/20 are                   available for comparison Performing Technologist: Sherren Kerns RVS  Examination Guidelines: A complete evaluation includes B-mode imaging, spectral Doppler, color Doppler, and power Doppler as needed of all accessible portions of each vessel. Bilateral testing is considered an integral part of a complete examination. Limited examinations for reoccurring indications may be performed as noted. The reflux portion of the exam is performed with the patient in reverse Trendelenburg.  +---------+---------------+---------+-----------+----------+-------------------+ RIGHT    CompressibilityPhasicitySpontaneityPropertiesThrombus Aging      +---------+---------------+---------+-----------+----------+-------------------+ CFV      Partial        No       Yes                  Age Indeterminate   +---------+---------------+---------+-----------+----------+-------------------+ SFJ      Full                                                             +---------+---------------+---------+-----------+----------+-------------------+ FV Prox  Partial        No       No                   Age  Indeterminate   +---------+---------------+---------+-----------+----------+-------------------+ FV Mid   Partial        No       No                   Age Indeterminate   +---------+---------------+---------+-----------+----------+-------------------+ FV DistalPartial        No       Yes                  Age Indeterminate   +---------+---------------+---------+-----------+----------+-------------------+ PFV      Partial  No       No                   Age Indeterminate   +---------+---------------+---------+-----------+----------+-------------------+ POP                                                   Not well visualized +---------+---------------+---------+-----------+----------+-------------------+ PTV      None                                         Age Indeterminate   +---------+---------------+---------+-----------+----------+-------------------+ PERO     None                                         Age Indeterminate   +---------+---------------+---------+-----------+----------+-------------------+   +----+---------------+---------+-----------+----------+--------------+ LEFTCompressibilityPhasicitySpontaneityPropertiesThrombus Aging +----+---------------+---------+-----------+----------+--------------+ CFV Full           Yes      Yes                                 +----+---------------+---------+-----------+----------+--------------+     Summary: RIGHT: - 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 - Findings appear essentially unchanged compared to previous examination.  LEFT: - No evidence of common femoral vein obstruction.  *See table(s) above for measurements and observations. Electronically signed by Sherald Hess MD on 07/30/2020 at 3:48:19 PM.    Final    VAS [place] UPPER  EXTREMITY VENOUS DUPLEX  Result Date: 07/30/2020 UPPER VENOUS STUDY  Indications: Edema Risk Factors: Known hematoma in left upper extremity. Limitations: Patient unable to turn head to right. Comparison       Prior LUE venous duplex from 06/09/20, is available for Study:           comparison Performing Technologist: Sherren Kerns RVS  Examination Guidelines: A complete evaluation includes B-mode imaging, spectral Doppler, color Doppler, and power Doppler as needed of all accessible portions of each vessel. Bilateral testing is considered an integral part of a complete examination. Limited examinations for reoccurring indications may be performed as noted.  Right Findings: +----------+------------+---------+-----------+----------+-------+ RIGHT     CompressiblePhasicitySpontaneousPropertiesSummary +----------+------------+---------+-----------+----------+-------+ IJV                      Yes       Yes                      +----------+------------+---------+-----------+----------+-------+ Subclavian               Yes       Yes                      +----------+------------+---------+-----------+----------+-------+  Left Findings: +----------+------------+---------+-----------+----------+---------------+ LEFT      CompressiblePhasicitySpontaneousProperties    Summary     +----------+------------+---------+-----------+----------+---------------+ IJV  Not visualized  +----------+------------+---------+-----------+----------+---------------+ Subclavian               Yes       Yes                              +----------+------------+---------+-----------+----------+---------------+ Axillary                 Yes       Yes                              +----------+------------+---------+-----------+----------+---------------+ Brachial      Full       Yes       Yes                               +----------+------------+---------+-----------+----------+---------------+ Radial                                              patent by color +----------+------------+---------+-----------+----------+---------------+ Ulnar                                               Not visualized  +----------+------------+---------+-----------+----------+---------------+ Cephalic      Full                                                  +----------+------------+---------+-----------+----------+---------------+ Basilic       Full                                                  +----------+------------+---------+-----------+----------+---------------+  Summary:  Right: No evidence of thrombosis in the subclavian.  Left: No evidence of deep vein thrombosis in the upper extremity. Hematoma found last exam, 05/20/20, is still present.  *See table(s) above for measurements and observations.  Diagnosing physician: Sherald Hess MD Electronically signed by Sherald Hess MD on 07/30/2020 at 3:47:09 PM.    Final    VAS [place] LOWER EXTREMITY VENOUS (DVT)  Final Result    VAS [place] UPPER EXTREMITY VENOUS DUPLEX  Final Result    [place] EKG SITE RITE  Final Result    VAS [place] LOWER EXTREMITY VENOUS (DVT)  Final Result    CT ABDOMEN PELVIS W CONTRAST  Final Result    CT L-SPINE NO CHARGE  Final Result      Scheduled Meds: . (feeding supplement) PROSource Plus  30 mL Oral BID BM  . busPIRone  15 mg Oral BID  . Chlorhexidine Gluconate Cloth  6 each Topical Daily  . donepezil  10 mg Oral QHS  . dronabinol  2.5 mg Oral BID AC  . feeding supplement  237 mL Oral QID  . ferrous sulfate  325 mg Oral BID WC  . linaclotide  145 mcg Oral Daily  . loratadine  10 mg Oral Daily  . memantine  28  mg Oral QHS  . multivitamin with minerals  1 tablet Oral Daily  . nutrition supplement (JUVEN)  1 packet Oral BID BM  . polyethylene glycol  17 g Oral Daily  . senna-docusate  1 tablet Oral BID  . sodium  chloride flush  10-40 mL Intracatheter Q12H   PRN Meds: acetaminophen **OR** acetaminophen, bisacodyl, bisacodyl, cycloSPORINE, menthol-cetylpyridinium **OR** phenol, morphine injection, oxyCODONE, prochlorperazine, sodium chloride flush, sodium phosphate Continuous Infusions: .  ceFAZolin (ANCEF) IV 2 g (07/31/20 1208)  . heparin 1,000 Units/hr (07/31/20 1210)  . lactated ringers 10 mL/hr at 07/30/20 1604     LOS: 10 days  Time spent: Greater than 50% of the 35 minute visit was spent in counseling/coordination of care for the patient as laid out in the A&P.   Lewie Chamber, MD [facility] 07/31/2020, 6:52 PM

## 2020-07-31 NOTE — Progress Notes (Signed)
Patient ID: [name], female   DOB: May 29, 1941, 79 y.o.   MRN: [id number] Vital signs are stable I have examined and changed her dressing on the back today and I note that the area of the excision and repair appears to be dry and healing nicely I have advised changing dressing daily and painting incision with Betadine At this point I believe that things are stabilizing enough that if need be she could be discharged to SNF for home with outpatient care Staple removal should not be for another week yet

## 2020-08-01 DIAGNOSIS — D649 Anemia, unspecified: Secondary | ICD-10-CM

## 2020-08-01 DIAGNOSIS — Z86711 Personal history of pulmonary embolism: Secondary | ICD-10-CM

## 2020-08-01 LAB — COMPREHENSIVE METABOLIC PANEL
ALT: 12 U/L (ref 0–44)
AST: 30 U/L (ref 15–41)
Albumin: 1.6 g/dL — ABNORMAL LOW (ref 3.5–5.0)
Alkaline Phosphatase: 53 U/L (ref 38–126)
Anion gap: 8 (ref 5–15)
BUN: 16 mg/dL (ref 8–23)
CO2: 27 mmol/L (ref 22–32)
Calcium: 8.5 mg/dL — ABNORMAL LOW (ref 8.9–10.3)
Chloride: 107 mmol/L (ref 98–111)
Creatinine, Ser: 0.43 mg/dL — ABNORMAL LOW (ref 0.44–1.00)
GFR, Estimated: >60 mL/min (ref >60)
Glucose, Bld: 83 mg/dL (ref 70–99)
Potassium: 3.4 mmol/L — ABNORMAL LOW (ref 3.5–5.1)
Sodium: 142 mmol/L (ref 135–145)
Total Bilirubin: 0.5 mg/dL (ref 0.3–1.2)
Total Protein: 5.2 g/dL — ABNORMAL LOW (ref 6.5–8.1)

## 2020-08-01 LAB — CBC WITH DIFFERENTIAL/PLATELET
Abs Immature Granulocytes: 0.08 10*3/uL — ABNORMAL HIGH (ref 0.00–0.07)
Basophils Absolute: 0 10*3/uL (ref 0.0–0.1)
Basophils Relative: 0 %
Eosinophils Absolute: 0.3 10*3/uL (ref 0.0–0.5)
Eosinophils Relative: 3 %
HCT: 22.9 % — ABNORMAL LOW (ref 36.0–46.0)
Hemoglobin: 6.9 g/dL — CL (ref 12.0–15.0)
Immature Granulocytes: 1 %
Lymphocytes Relative: 17 %
Lymphs Abs: 2.1 10*3/uL (ref 0.7–4.0)
MCH: 29.2 pg (ref 26.0–34.0)
MCHC: 30.1 g/dL (ref 30.0–36.0)
MCV: 97 fL (ref 80.0–100.0)
Monocytes Absolute: 0.9 10*3/uL (ref 0.1–1.0)
Monocytes Relative: 7 %
Neutro Abs: 8.7 10*3/uL — ABNORMAL HIGH (ref 1.7–7.7)
Neutrophils Relative %: 72 %
Platelets: 246 10*3/uL (ref 150–400)
RBC: 2.36 MIL/uL — ABNORMAL LOW (ref 3.87–5.11)
RDW: 18.5 % — ABNORMAL HIGH (ref 11.5–15.5)
WBC: 12.1 10*3/uL — ABNORMAL HIGH (ref 4.0–10.5)
nRBC: 0.2 % (ref 0.0–0.2)

## 2020-08-01 LAB — MAGNESIUM: Magnesium: 2.1 mg/dL (ref 1.7–2.4)

## 2020-08-01 LAB — HEMOGLOBIN AND HEMATOCRIT, BLOOD
HCT: 28.9 % — ABNORMAL LOW (ref 36.0–46.0)
Hemoglobin: 8.9 g/dL — ABNORMAL LOW (ref 12.0–15.0)

## 2020-08-01 LAB — APTT: aPTT: 103 seconds — ABNORMAL HIGH (ref 24–36)

## 2020-08-01 LAB — PREPARE RBC (CROSSMATCH)

## 2020-08-01 LAB — HEPARIN LEVEL (UNFRACTIONATED): Heparin Unfractionated: 0.8 IU/mL — ABNORMAL HIGH (ref 0.30–0.70)

## 2020-08-01 MED ORDER — SODIUM CHLORIDE 0.9% IV SOLUTION: Freq: Once | INTRAVENOUS | Status: AC

## 2020-08-01 MED ORDER — POTASSIUM CHLORIDE CRYS ER 20 MEQ PO TBCR
20.0000 meq | EXTENDED_RELEASE_TABLET | Freq: Two times a day (BID) | ORAL | Status: DC
  Administered 2020-08-01 – 2020-08-07 (×11): 20 meq via ORAL
  Filled 2020-08-01 (×13): qty 1

## 2020-08-01 NOTE — Assessment & Plan Note (Signed)
-   continue dressing changes

## 2020-08-01 NOTE — Progress Notes (Signed)
Occupational Therapy Treatment Patient Details Name: [name] MRN: [id number] DOB: 02-Feb-1941 Today's Date: 08/01/2020    History of present illness [name] is an 79 y.o. 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 Pt also noted with exposed hardward. Pt underwent I&D of wound and removal of spinal fusion hardware on 12/16.   OT comments  This 79yo female admitted with above presents to acute OT with pain in RLE with movement, total A for rolling in bed, and able to participate in Bil UE exercise program as able to wash her face while supine in bed with RUE. She is very weak and will continue to benefit from acute OT with follow up at SNF.  Follow Up Recommendations  SNF;Supervision/Assistance - 24 hour    Equipment Recommendations  Wheelchair (measurements OT);Wheelchair cushion (measurements OT);Hospital bed;Other (comment)       Precautions / Restrictions Precautions Precautions: Fall Precaution Comments: L-sided weakness from prior CVA Restrictions Weight Bearing Restrictions: No       Mobility Bed Mobility Overal bed mobility: Needs Assistance Bed Mobility: Rolling Rolling: Total assist                   ADL either performed or assessed with clinical judgement   ADL Overall ADL's : Needs assistance/impaired     Grooming: Wash/dry face;Bed level;Set up                                       Vision Patient Visual Report: No change from baseline Vision Assessment?: Vision impaired- to be further tested in functional context          Cognition  Arousal/Alertness: Awake/alert Behavior During Therapy: Flat affect Overall Cognitive Status: No family/caregiver present to determine baseline cognitive functioning Area of Impairment: Orientation;Following commands;Safety/judgement;Problem solving                 Orientation Level: Disoriented to;Situation;Time     Following Commands: Follows one step commands inconsistently;Follows one step commands with increased time Safety/Judgement: Decreased awareness of deficits   Problem Solving: Slow processing;Difficulty sequencing;Decreased initiation;Requires verbal cues;Requires tactile cues          Exercises Other Exercises Other Exercises: Patient participated in Bil UE exercises--AROM RUE for shoulder flexion/extension, elbow flexion/extension, composite grip and release (10 reps each) bed level. AAROM LUE for shoulder flexion/extension (could not actively flex but with a trace of extension), elbow flexion/extension (no active flexion noted/trace extension), 1+/5 forearm supination/pronation with weight of arm supported, no AROM of wrist noted (PROM full flexion and extension to neutral), trace finger movement.           Pertinent Vitals/ Pain       Pain Assessment: Faces Faces Pain Scale: Hurts even more Pain Location: RLE with touching or moving Pain Descriptors / Indicators: Discomfort;Grimacing Pain Intervention(s): Limited activity within patient's tolerance;Monitored during session;Repositioned         Frequency  Min 2X/week        Progress Toward Goals  OT Goals(current goals can now be found in the care plan section)  Progress towards OT goals: Not progressing toward goals - comment (overall just really weak--but able to partcipate some and willing to try)  Acute Rehab OT Goals Patient Stated Goal: to be able to do more OT Goal Formulation: With patient Time For Goal Achievement: 07/28/20 Potential to Achieve Goals: Fair  Plan Discharge plan remains  appropriate       AM-PAC OT "6 Clicks" Daily Activity     Outcome Measure   Help from another person eating meals?: Total Help from another person taking care of personal grooming?: Total Help from another person toileting, which includes using toliet, bedpan, or urinal?: Total Help from another person bathing (including washing, rinsing, drying)?: Total Help from another person to put on and taking off regular upper body clothing?: Total Help from another person to put on and taking off regular lower body clothing?: Total 6 Click Score: 6    End of Session    OT Visit Diagnosis: Other abnormalities of gait and mobility (R26.89);Muscle weakness (generalized) (M62.81);Other symptoms and signs involving cognitive function;Pain;Hemiplegia and hemiparesis Hemiplegia - Right/Left: Left Hemiplegia - dominant/non-dominant: Non-Dominant Hemiplegia - caused by: Cerebral infarction Pain - Right/Left: Right Pain - part of body:  (leg)   Activity Tolerance Patient tolerated treatment well   Patient Left in bed;with call bell/phone within reach   Nurse Communication  (pt's bed wet)        Time: 4332-9518 OT Time Calculation (min): 20 min  Charges: OT General Charges $OT Visit: 1 Visit OT Treatments $Therapeutic Exercise: 8-22 mins  Ignacia Palma, OTR/L Acute Rehab Services Pager [phone] Office [phone]      Evette Georges 08/01/2020, 11:03 AM

## 2020-08-01 NOTE — Assessment & Plan Note (Addendum)
-   05/06/20 and 07/22/20: Right CFV, femoral vein, prox profunda vein, popliteal vein, posterior tibial vein, peroneal vein.  Left posterior tibial vein, peroneal vein.  -Of note, also developed a left upper extremity hematoma -Previously on Eliquis outpatient, has been on heparin drip during hospitalization.  She has now had drop in hemoglobin requiring blood transfusion on 08/01/2020.  No longer considered a good candidate for ongoing long-term anticoagulation - IVC placed in Sept 2021

## 2020-08-01 NOTE — Assessment & Plan Note (Addendum)
Alert to self and place Continue BuSpar, Aricept, Namenda, delirium precautions -Palliative care has followed patient during hospitalization and she was also enrolled with palliative care outpatient prior to hospitalization.  Family is wishing for aggressive measures despite patient wishes for pursuing more of a comfort care route.  Difficult in setting of underlying dementia diagnosis.  Palliative care will continue to follow along at discharge. Patient has voiced wishes for comfort care/hospice however family remains aggressive; need ongoing discussions for GOC as patient does appear to be hospice appropriate

## 2020-08-01 NOTE — Assessment & Plan Note (Addendum)
-  05/05/2020: Large bilateral acute PE including saddle embolus and large clot burden.  Echo suggests right heart strain as well - s/p Eliquis outpatient and heparin inpatient; failed trial due to Hgb downtrend requiring repeat transfusion - d/c anticoagulation at this time; poor candidate for further use

## 2020-08-01 NOTE — Assessment & Plan Note (Signed)
-  Replete and recheck as needed 

## 2020-08-01 NOTE — Progress Notes (Signed)
Civil engineer, contracting [facility]) Community Based Palliative Care  This patient is enrolled in our palliative care services in the community.  [facility] palliative NP asks that inpatient goals of care conversation be had with pt's family.     [facility] will continue to follow for any discharge planning needs and to coordinate continuation of palliative care.  Please do not hesitate to call with questions.     Thank you,    Elsie Saas, RN, [facility]       [facility] Liaison (listed on [facility] under Hospice [facility])     [phone]

## 2020-08-01 NOTE — Progress Notes (Signed)
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  She presented with sepsis and after work-up was found to have an unstageable ulcer of the distal thoracic/lumbar spine.  She underwent removal of metal hardware in her back with neurosurgery on 07/27/2020. Infectious disease was also consulted and ultimately she is recommended to continue on 6 weeks of antibiotics which were deescalated down to Ancef. She was started on a heparin drip after surgery to monitor hemoglobin.  Unfortunately, hemoglobin continued to downtrend and she required blood transfusion on 08/01/2020.  Due to this, she was considered a poor candidate to continue on anticoagulation.  She has been on anticoagulation outpatient for approximately 3 months with Eliquis.  She also has an IVC in place from September 2021.   Interval History:  Hemoglobin was found to be low this morning and blood transfusion was ordered.  She was otherwise asymptomatic and resting in bed when seen this morning.  Denied any pain or swelling.  Denied dizziness or lightheadedness.  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 intake and probable component of hemodilution and acute blood loss possibly from left upper extremity hematoma -Hemoglobin down to 6.9 g/dL on 40/81/4481, 1 unit PRBC being transfused -Discontinue heparin drip at this time.  No further anticoagulation -Continue monitoring hemoglobin and transfuse as indicated  History of DVT (deep vein thrombosis) - 05/06/20 and 07/22/20: Right CFV, femoral vein, prox profunda vein, popliteal vein, posterior tibial vein, peroneal vein.  Left posterior tibial vein, peroneal vein.  -Of note, also developed a left upper extremity hematoma -Previously on Eliquis outpatient, has been on heparin drip during hospitalization.  She has now had drop in hemoglobin requiring blood transfusion on 08/01/2020.  No longer considered a good candidate for ongoing long-term anticoagulation - IVC placed in Sept 2021  History of pulmonary embolus (PE) -05/05/2020: Large bilateral acute PE including saddle embolus and large clot burden.  Echo suggests right heart strain as well - s/p Eliquis outpatient and heparin inpatient - Hgb continues to downtrend - d/c anticoagulation at this time; poor candidate for further use  Dementia (HCC) Alert to self and place Continue BuSpar, Aricept, Namenda, delirium precautions  Pressure injury, unstageable (HCC) - continue dressing changes  Osteomyelitis (HCC) - see sepsis  Hypocalcemia -Replete and recheck as needed  Benign essential HTN -Blood pressure stable -On amlodipine, Lasix, lisinopril at home; on hold for now   Antimicrobials: Ancef  DVT prophylaxis: SCD Code Status: Partial Family  Communication:  Disposition Plan: Status is:  Inpatient  Remains inpatient appropriate because:IV treatments appropriate due to intensity of illness or inability to take PO and Inpatient level of care appropriate due to severity of illness   Dispo:  Patient From: Home  Planned Disposition: Skilled Nursing Facility  Expected discharge date: 08/01/2020  Medically stable for discharge: No  Objective: Blood pressure 124/65, pulse 87, temperature 99 F (37.2 C), temperature source Oral, resp. rate 16, height 5\' 2"  (1.575 m), weight 87.9 kg, SpO2 97 %.  Examination: General appearance: alert, cooperative and no distress Head: Normocephalic, without obvious abnormality, atraumatic Eyes: EOMI Lungs: clear to auscultation bilaterally Heart: regular rate and rhythm and S1, S2 normal Abdomen: normal findings: bowel sounds normal and soft, non-tender Extremities: no edema Skin: mobility and turgor normal Neurologic: LUE 1/5, 5/5 all others  Consultants:   Neurosurgery  PCM  Procedures:     Data Reviewed: I have personally reviewed following labs and imaging studies Results for orders placed or performed during the hospital encounter of 07/21/20 (from the past 24 hour(s))  Comprehensive metabolic panel     Status: Abnormal   Collection Time: 08/01/20  1:47 AM  Result Value Ref Range   Sodium 142 135 - 145 mmol/L   Potassium 3.4 (L) 3.5 - 5.1 mmol/L   Chloride 107 98 - 111 mmol/L   CO2 27 22 - 32 mmol/L   Glucose, Bld 83 70 - 99 mg/dL   BUN 16 8 - 23 mg/dL   Creatinine, Ser 08/03/20 (L) 0.44 - 1.00 mg/dL   Calcium 8.5 (L) 8.9 - 10.3 mg/dL   Total Protein 5.2 (L) 6.5 - 8.1 g/dL   Albumin 1.6 (L) 3.5 - 5.0 g/dL   AST 30 15 - 41 U/L   ALT 12 0 - 44 U/L   Alkaline Phosphatase 53 38 - 126 U/L   Total Bilirubin 0.5 0.3 - 1.2 mg/dL   GFR, Estimated 6.16 >07 mL/min   Anion gap 8 5 - 15  Magnesium     Status: None   Collection Time: 08/01/20  1:47 AM  Result Value Ref Range   Magnesium 2.1 1.7 - 2.4 mg/dL  Heparin level  (unfractionated)     Status: Abnormal   Collection Time: 08/01/20  1:47 AM  Result Value Ref Range   Heparin Unfractionated 0.80 (H) 0.30 - 0.70 IU/mL  APTT     Status: Abnormal   Collection Time: 08/01/20  1:47 AM  Result Value Ref Range   aPTT 103 (H) 24 - 36 seconds  CBC with Differential/Platelet     Status: Abnormal   Collection Time: 08/01/20  1:47 AM  Result Value Ref Range   WBC 12.1 (H) 4.0 - 10.5 K/uL   RBC 2.36 (L) 3.87 - 5.11 MIL/uL   Hemoglobin 6.9 (LL) 12.0 - 15.0 g/dL   HCT 08/03/20 (L) 10.6 - 26.9 %   MCV 97.0 80.0 - 100.0 fL   MCH 29.2 26.0 - 34.0 pg   MCHC 30.1 30.0 - 36.0 g/dL   RDW 48.5 (H) 46.2 - 70.3 %   Platelets 246 150 - 400 K/uL   nRBC 0.2 0.0 - 0.2 %   Neutrophils Relative % 72 %   Neutro Abs 8.7 (H) 1.7 - 7.7 K/uL   Lymphocytes Relative 17 %   Lymphs Abs 2.1 0.7 - 4.0 K/uL   Monocytes Relative 7 %   Monocytes Absolute 0.9 0.1 - 1.0 K/uL   Eosinophils Relative 3 %  Eosinophils Absolute 0.3 0.0 - 0.5 K/uL   Basophils Relative 0 %   Basophils Absolute 0.0 0.0 - 0.1 K/uL   Immature Granulocytes 1 %   Abs Immature Granulocytes 0.08 (H) 0.00 - 0.07 K/uL  Prepare RBC (crossmatch)     Status: None   Collection Time: 08/01/20  4:47 AM  Result Value Ref Range   Order Confirmation      ORDER PROCESSED BY BLOOD BANK Performed at [facility], [address] [place], [place] [address]   Hemoglobin and hematocrit, blood     Status: Abnormal   Collection Time: 08/01/20 12:45 PM  Result Value Ref Range   Hemoglobin 8.9 (L) 12.0 - 15.0 g/dL   HCT 60.7 (L) 37.1 - 06.2 %    No results found for this or any previous visit (from the past 240 hour(s)).   Radiology Studies: No results found. VAS [place] LOWER EXTREMITY VENOUS (DVT)  Final Result    VAS [place] UPPER EXTREMITY VENOUS DUPLEX  Final Result    [place] EKG SITE RITE  Final Result    VAS [place] LOWER EXTREMITY VENOUS (DVT)  Final Result    CT ABDOMEN PELVIS W CONTRAST  Final Result    CT L-SPINE NO  CHARGE  Final Result      Scheduled Meds: . (feeding supplement) PROSource Plus  30 mL Oral BID BM  . busPIRone  15 mg Oral BID  . Chlorhexidine Gluconate Cloth  6 each Topical Daily  . donepezil  10 mg Oral QHS  . dronabinol  2.5 mg Oral BID AC  . feeding supplement  237 mL Oral QID  . ferrous sulfate  325 mg Oral BID WC  . linaclotide  145 mcg Oral Daily  . loratadine  10 mg Oral Daily  . memantine  28 mg Oral QHS  . multivitamin with minerals  1 tablet Oral Daily  . nutrition supplement (JUVEN)  1 packet Oral BID BM  . polyethylene glycol  17 g Oral Daily  . potassium chloride  20 mEq Oral BID  . senna-docusate  1 tablet Oral BID  . sodium chloride flush  10-40 mL Intracatheter Q12H   PRN Meds: acetaminophen **OR** acetaminophen, bisacodyl, bisacodyl, cycloSPORINE, menthol-cetylpyridinium **OR** phenol, morphine injection, oxyCODONE, prochlorperazine, sodium chloride flush, sodium phosphate Continuous Infusions: .  ceFAZolin (ANCEF) IV 2 g (08/01/20 1123)  . lactated ringers 10 mL/hr at 08/01/20 1300     LOS: 11 days  Time spent: Greater than 50% of the 35 minute visit was spent in counseling/coordination of care for the patient as laid out in the A&P.   Lewie Chamber, MD [facility] 08/01/2020, 5:38 PM

## 2020-08-01 NOTE — Progress Notes (Signed)
Blood transfusion completed at 0900.  Vitals stable; patient denies c/o.  No s/s reaction.

## 2020-08-01 NOTE — Assessment & Plan Note (Addendum)
-   Patient appears to have anemia of chronic disease 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 intake and probable component of hemodilution and acute blood loss possibly from left upper extremity hematoma -Hemoglobin down to 6.9 g/dL on 85/92/9244, 1 unit PRBC being transfused -Discontinued heparin drip on 12/21.  No further anticoagulation -Hemoglobin remains stable especially off of anticoagulation.  At this time she is stable for discharging to SNF when bed available

## 2020-08-01 NOTE — Assessment & Plan Note (Signed)
-   see sepsis 

## 2020-08-01 NOTE — Progress Notes (Signed)
Patient ID: [name], female   DOB: Jul 29, 1941, 79 y.o.   MRN: [id number] Vital signs are stable She has just received some blood She seems a bit more chipper today Continues with her dressing changes Dressing appears to be intact.

## 2020-08-01 NOTE — Assessment & Plan Note (Addendum)
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

## 2020-08-01 NOTE — Hospital Course (Addendum)
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  She presented with sepsis and after work-up was found to have an unstageable ulcer of the distal thoracic/lumbar spine.  She underwent removal of metal hardware in her back with neurosurgery on 07/27/2020. Infectious disease was also consulted and ultimately she is recommended to continue on 6 weeks of antibiotics which were deescalated down to Ancef. She was started on a heparin drip after surgery to monitor hemoglobin.  Unfortunately, hemoglobin continued to downtrend and she required blood transfusion on 08/01/2020.  Due to this, she was considered a poor candidate to continue on anticoagulation.  She has been on anticoagulation outpatient for approximately 3 months with Eliquis.  She also has an IVC in place from September 2021.  See below for further plan per problem.

## 2020-08-01 NOTE — Plan of Care (Signed)
Patient progressing as expected; was constipated but has now had an xx-large bowel movement; hard balls of stool at first, then soft.  No evidence of bleeding.  Hgb now 8.9.  Will monitor.  Problem: Education: Goal: Knowledge of General Education information will improve Description: Including pain rating scale, medication(s)/side effects and non-pharmacologic comfort measures 08/01/2020 1504 by Delano Metz, RN Outcome: Progressing 08/01/2020 1500 by Delano Metz, RN Outcome: Progressing   Problem: Health Behavior/Discharge Planning: Goal: Ability to manage health-related needs will improve 08/01/2020 1504 by Delano Metz, RN Outcome: Progressing 08/01/2020 1500 by Delano Metz, RN Outcome: Progressing   Problem: Clinical Measurements: Goal: Ability to maintain clinical measurements within normal limits will improve 08/01/2020 1504 by Delano Metz, RN Outcome: Progressing 08/01/2020 1500 by Delano Metz, RN Outcome: Progressing Goal: Will remain free from infection 08/01/2020 1504 by Delano Metz, RN Outcome: Progressing 08/01/2020 1500 by Delano Metz, RN Outcome: Progressing Goal: Diagnostic test results will improve 08/01/2020 1504 by Delano Metz, RN Outcome: Progressing 08/01/2020 1500 by Delano Metz, RN Outcome: Progressing Goal: Respiratory complications will improve 08/01/2020 1504 by Delano Metz, RN Outcome: Progressing 08/01/2020 1500 by Delano Metz, RN Outcome: Progressing Goal: Cardiovascular complication will be avoided 08/01/2020 1504 by Delano Metz, RN Outcome: Progressing 08/01/2020 1500 by Delano Metz, RN Outcome: Progressing   Problem: Activity: Goal: Risk for activity intolerance will decrease 08/01/2020 1504 by Delano Metz, RN Outcome: Progressing 08/01/2020 1500 by Delano Metz, RN Outcome: Progressing   Problem: Nutrition: Goal: Adequate nutrition will be maintained 08/01/2020 1504 by Delano Metz,  RN Outcome: Progressing 08/01/2020 1500 by Delano Metz, RN Outcome: Progressing   Problem: Coping: Goal: Level of anxiety will decrease 08/01/2020 1504 by Delano Metz, RN Outcome: Progressing 08/01/2020 1500 by Delano Metz, RN Outcome: Progressing   Problem: Elimination: Goal: Will not experience complications related to bowel motility 08/01/2020 1504 by Delano Metz, RN Outcome: Progressing 08/01/2020 1500 by Delano Metz, RN Outcome: Progressing Goal: Will not experience complications related to urinary retention 08/01/2020 1504 by Delano Metz, RN Outcome: Progressing 08/01/2020 1500 by Delano Metz, RN Outcome: Progressing   Problem: Pain Managment: Goal: General experience of comfort will improve 08/01/2020 1504 by Delano Metz, RN Outcome: Progressing 08/01/2020 1500 by Delano Metz, RN Outcome: Progressing   Problem: Safety: Goal: Ability to remain free from injury will improve 08/01/2020 1504 by Delano Metz, RN Outcome: Progressing 08/01/2020 1500 by Delano Metz, RN Outcome: Progressing   Problem: Skin Integrity: Goal: Risk for impaired skin integrity will decrease 08/01/2020 1504 by Delano Metz, RN Outcome: Progressing 08/01/2020 1500 by Delano Metz, RN Outcome: Progressing

## 2020-08-01 NOTE — Progress Notes (Signed)
Received critical Hgb 6.9 called on call, Dr. Katherina Right.  Patient had heparin gtt running and only iv assess was a single lumen picc.  Heparin gtt was cut off and 1 unit of blood was transfused.  Patient has complained of constipation all night.  Patient has passes several hard balls of stools and complained of pain.  Suppository was administered and pain medication as well.  RN has encouraged PO intake tonight, which has been poor.  Patient has also not urinated tonight and is edematous.  Spoke with Katherina Right and she feels pt might need some lasix after blood infusion is complete and she will leave a note for the doctor as well.  Will bladder scan the patient to see if she is retaining at this time and pass information off to dayshift

## 2020-08-01 NOTE — Assessment & Plan Note (Signed)
-  Blood pressure stable -On amlodipine, Lasix, lisinopril at home; on hold for now

## 2020-08-01 NOTE — Progress Notes (Signed)
Responded to consult for IV start. Pt currently has SL PICC in R arm and restricted left arm. Based on current orders pt does need 2nd site for vascular access. Recommend discussion with MD regarding possible EJ placement; RN in agreement and states she will address. VAST available for any further questions.

## 2020-08-02 LAB — CBC WITH DIFFERENTIAL/PLATELET
Abs Immature Granulocytes: 0.12 10*3/uL — ABNORMAL HIGH (ref 0.00–0.07)
Basophils Absolute: 0.1 10*3/uL (ref 0.0–0.1)
Basophils Relative: 0 %
Eosinophils Absolute: 0.2 10*3/uL (ref 0.0–0.5)
Eosinophils Relative: 2 %
HCT: 26.1 % — ABNORMAL LOW (ref 36.0–46.0)
Hemoglobin: 8.3 g/dL — ABNORMAL LOW (ref 12.0–15.0)
Immature Granulocytes: 1 %
Lymphocytes Relative: 13 %
Lymphs Abs: 1.7 10*3/uL (ref 0.7–4.0)
MCH: 29.3 pg (ref 26.0–34.0)
MCHC: 31.8 g/dL (ref 30.0–36.0)
MCV: 92.2 fL (ref 80.0–100.0)
Monocytes Absolute: 0.9 10*3/uL (ref 0.1–1.0)
Monocytes Relative: 7 %
Neutro Abs: 9.8 10*3/uL — ABNORMAL HIGH (ref 1.7–7.7)
Neutrophils Relative %: 77 %
Platelets: 253 10*3/uL (ref 150–400)
RBC: 2.83 MIL/uL — ABNORMAL LOW (ref 3.87–5.11)
RDW: 19.1 % — ABNORMAL HIGH (ref 11.5–15.5)
WBC: 12.9 10*3/uL — ABNORMAL HIGH (ref 4.0–10.5)
nRBC: 0.3 % — ABNORMAL HIGH (ref 0.0–0.2)

## 2020-08-02 LAB — TYPE AND SCREEN
ABO/RH(D): AB POS
Antibody Screen: NEGATIVE
Unit division: 0

## 2020-08-02 LAB — MAGNESIUM: Magnesium: 2 mg/dL (ref 1.7–2.4)

## 2020-08-02 LAB — COMPREHENSIVE METABOLIC PANEL
ALT: 8 U/L (ref 0–44)
AST: 22 U/L (ref 15–41)
Albumin: 1.5 g/dL — ABNORMAL LOW (ref 3.5–5.0)
Alkaline Phosphatase: 57 U/L (ref 38–126)
Anion gap: 8 (ref 5–15)
BUN: 14 mg/dL (ref 8–23)
CO2: 27 mmol/L (ref 22–32)
Calcium: 8.4 mg/dL — ABNORMAL LOW (ref 8.9–10.3)
Chloride: 105 mmol/L (ref 98–111)
Creatinine, Ser: 0.39 mg/dL — ABNORMAL LOW (ref 0.44–1.00)
GFR, Estimated: >60 mL/min (ref >60)
Glucose, Bld: 91 mg/dL (ref 70–99)
Potassium: 3.6 mmol/L (ref 3.5–5.1)
Sodium: 140 mmol/L (ref 135–145)
Total Bilirubin: 0.7 mg/dL (ref 0.3–1.2)
Total Protein: 5.2 g/dL — ABNORMAL LOW (ref 6.5–8.1)

## 2020-08-02 LAB — BPAM RBC
Blood Product Expiration Date: 202201102359
ISSUE DATE / TIME: 202112210519
Unit Type and Rh: 8400

## 2020-08-02 NOTE — Progress Notes (Signed)
Pt very sleepy this morning. Pt did not eat any breakfast and drank a couple sips of juice. When this nurse assessed pt was unable to stay awake long enough to answer questions. This nurse felt it was unsafe to attempt to administer oral medications at this time. Mayford Knife RN

## 2020-08-02 NOTE — Progress Notes (Signed)
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  She presented with sepsis and after work-up was found to have an unstageable ulcer of the distal thoracic/lumbar spine.  She underwent removal of metal hardware in her back with neurosurgery on 07/27/2020. Infectious disease was also consulted and ultimately she is recommended to continue on 6 weeks of antibiotics which were deescalated down to Ancef. She was started on a heparin drip after surgery to monitor hemoglobin.  Unfortunately, hemoglobin continued to downtrend and she required blood transfusion on 08/01/2020.  Due to this, she was considered a poor candidate to continue on anticoagulation.  She has been on anticoagulation outpatient for approximately 3 months with Eliquis.  She also has an IVC in place from September 2021.   Interval History:  Hgb more stable this am after blood yesterday. She is lethargic but arouses this am and does answer my questions.  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 intake and probable component of hemodilution and acute blood loss possibly from left upper extremity hematoma -Hemoglobin down to 6.9 g/dL on 35/32/9924, 1 unit PRBC being transfused -Discontinued heparin drip on 12/21.  No further anticoagulation -Continue monitoring hemoglobin at least another 24 hours  History of DVT (deep vein thrombosis) - 05/06/20 and 07/22/20: Right CFV, femoral vein, prox profunda vein, popliteal vein, posterior tibial vein, peroneal vein.  Left posterior tibial vein, peroneal vein.  -Of note, also developed a left upper extremity hematoma -Previously on Eliquis outpatient, has been on heparin drip during hospitalization.  She has now had drop in hemoglobin requiring blood transfusion on 08/01/2020.  No longer considered a good candidate for ongoing long-term anticoagulation - IVC placed in Sept 2021  History of pulmonary embolus (PE) -05/05/2020: Large bilateral acute PE including saddle embolus and large clot burden.  Echo suggests right heart strain as well - s/p Eliquis outpatient and heparin inpatient - Hgb continues to downtrend - d/c anticoagulation at this time; poor candidate for further use  Dementia (HCC) Alert to self and place Continue BuSpar, Aricept, Namenda, delirium precautions  Pressure injury, unstageable (HCC) - continue dressing changes  Osteomyelitis (HCC) - see sepsis  Hypocalcemia -Replete and recheck as needed  Benign essential HTN -Blood pressure stable -On amlodipine, Lasix, lisinopril at home; on hold for now  Antimicrobials: Ancef  DVT prophylaxis: SCD Code Status: Partial Family Communication:  Disposition Plan: Status is: Inpatient  Remains inpatient appropriate because:IV treatments appropriate due  to intensity of  illness or inability to take PO and Inpatient level of care appropriate due to severity of illness   Dispo:  Patient From: Home  Planned Disposition: Skilled Nursing Facility  Expected discharge date: 08/04/2020  Medically stable for discharge: No  Objective: Blood pressure (!) 157/72, pulse 71, temperature 99.6 F (37.6 C), temperature source Axillary, resp. rate 19, height 5\' 2"  (1.575 m), weight 87.4 kg, SpO2 97 %.  Examination: General appearance: alert, cooperative and no distress Head: Normocephalic, without obvious abnormality, atraumatic Eyes: EOMI Lungs: clear to auscultation bilaterally Heart: regular rate and rhythm and S1, S2 normal Abdomen: normal findings: bowel sounds normal and soft, non-tender Extremities: no edema Skin: mobility and turgor normal Neurologic: LUE 1/5, 5/5 all others  Consultants:   Neurosurgery  PCM  Procedures:     Data Reviewed: I have personally reviewed following labs and imaging studies Results for orders placed or performed during the hospital encounter of 07/21/20 (from the past 24 hour(s))  Comprehensive metabolic panel     Status: Abnormal   Collection Time: 08/02/20  2:00 AM  Result Value Ref Range   Sodium 140 135 - 145 mmol/L   Potassium 3.6 3.5 - 5.1 mmol/L   Chloride 105 98 - 111 mmol/L   CO2 27 22 - 32 mmol/L   Glucose, Bld 91 70 - 99 mg/dL   BUN 14 8 - 23 mg/dL   Creatinine, Ser 08/04/20 (L) 0.44 - 1.00 mg/dL   Calcium 8.4 (L) 8.9 - 10.3 mg/dL   Total Protein 5.2 (L) 6.5 - 8.1 g/dL   Albumin 1.5 (L) 3.5 - 5.0 g/dL   AST 22 15 - 41 U/L   ALT 8 0 - 44 U/L   Alkaline Phosphatase 57 38 - 126 U/L   Total Bilirubin 0.7 0.3 - 1.2 mg/dL   GFR, Estimated 8.92 >11 mL/min   Anion gap 8 5 - 15  Magnesium     Status: None   Collection Time: 08/02/20  2:00 AM  Result Value Ref Range   Magnesium 2.0 1.7 - 2.4 mg/dL  CBC with Differential/Platelet     Status: Abnormal   Collection Time: 08/02/20  2:00 AM  Result Value Ref Range    WBC 12.9 (H) 4.0 - 10.5 K/uL   RBC 2.83 (L) 3.87 - 5.11 MIL/uL   Hemoglobin 8.3 (L) 12.0 - 15.0 g/dL   HCT 08/04/20 (L) 17.4 - 08.1 %   MCV 92.2 80.0 - 100.0 fL   MCH 29.3 26.0 - 34.0 pg   MCHC 31.8 30.0 - 36.0 g/dL   RDW 44.8 (H) 18.5 - 63.1 %   Platelets 253 150 - 400 K/uL   nRBC 0.3 (H) 0.0 - 0.2 %   Neutrophils Relative % 77 %   Neutro Abs 9.8 (H) 1.7 - 7.7 K/uL   Lymphocytes Relative 13 %   Lymphs Abs 1.7 0.7 - 4.0 K/uL   Monocytes Relative 7 %   Monocytes Absolute 0.9 0.1 - 1.0 K/uL   Eosinophils Relative 2 %   Eosinophils Absolute 0.2 0.0 - 0.5 K/uL   Basophils Relative 0 %   Basophils Absolute 0.1 0.0 - 0.1 K/uL   Immature Granulocytes 1 %   Abs Immature Granulocytes 0.12 (H) 0.00 - 0.07 K/uL    No results found for this or any previous visit (from the past 240 hour(s)).   Radiology Studies: No results found. VAS 49.7 LOWER EXTREMITY VENOUS (DVT)  Final Result    VAS [place]  UPPER EXTREMITY VENOUS DUPLEX  Final Result    [place] EKG SITE RITE  Final Result    VAS [place] LOWER EXTREMITY VENOUS (DVT)  Final Result    CT ABDOMEN PELVIS W CONTRAST  Final Result    CT L-SPINE NO CHARGE  Final Result      Scheduled Meds: . (feeding supplement) PROSource Plus  30 mL Oral BID BM  . busPIRone  15 mg Oral BID  . Chlorhexidine Gluconate Cloth  6 each Topical Daily  . donepezil  10 mg Oral QHS  . dronabinol  2.5 mg Oral BID AC  . feeding supplement  237 mL Oral QID  . ferrous sulfate  325 mg Oral BID WC  . linaclotide  145 mcg Oral Daily  . loratadine  10 mg Oral Daily  . memantine  28 mg Oral QHS  . multivitamin with minerals  1 tablet Oral Daily  . nutrition supplement (JUVEN)  1 packet Oral BID BM  . polyethylene glycol  17 g Oral Daily  . potassium chloride  20 mEq Oral BID  . senna-docusate  1 tablet Oral BID  . sodium chloride flush  10-40 mL Intracatheter Q12H   PRN Meds: acetaminophen **OR** acetaminophen, bisacodyl, bisacodyl, cycloSPORINE, menthol-cetylpyridinium  **OR** phenol, morphine injection, oxyCODONE, prochlorperazine, sodium chloride flush, sodium phosphate Continuous Infusions: .  ceFAZolin (ANCEF) IV Stopped (08/02/20 1334)  . lactated ringers 10 mL/hr at 08/02/20 1446     LOS: 12 days  Time spent: Greater than 50% of the 35 minute visit was spent in counseling/coordination of care for the patient as laid out in the A&P.   Lewie Chamber, MD [facility] 08/02/2020, 4:24 PM

## 2020-08-02 NOTE — Consult Note (Signed)
Consultation Note Date: 08/02/2020   Patient Name: [name]  DOB: 03-12-1941  MRN: [id number]  Age / Sex: 79 y.o., female  PCP: Nolene Ebbs, MD Referring Physician: Dwyane Dee, MD  Reason for Consultation: Establishing goals of care  HPI/Patient Profile: 79 y.o. female  with past medical history of PE/DVT s/p IVC filter 9/2021on Eliquis, stroke, vascular dementia, unstageable decubitus ulcer to distal thoracic spine (home health wound care), chronic pain admitted on 07/21/2020 with worsening thoracic spine wound with sepsis and osteomyelitis. Removal of metal hardware in back 07/27/20. Plans for 6 weeks antibiotics. Followed by outpatient palliative care via [facility]. Family have previously declined hospice services per notes.   Clinical Assessment and Goals of Care: I met today at [name]'s bedside. No visitors or family present. [name] is more alert and awake today. She is able to answer simple questions appropriately.   While attempting to speak with [name] I informed her that her she has a bad wound on her back and she has been very ill and continues to be very ill. She denies pain or hunger or thirst.   I asked [name] if she was worried about anything. She tells me "I haven't heard from my son" and tells me that she would like to speak with him. I told her that I would try and reach him today. I asked if there was anything else worrying her and she reports "everything." I asked what is bothering her the most and she tells me "I want to die." I asked her more about this and she tells me that she is "tired" and miserable and repeats that she just wants to die. I asked her if she wishes for [place] to provide any more interventions to prolong her life and she says "no." I asked if she wants [place] to leave her alone and let her rest comfortably and she says "yes."  I have attempted to  reach [name]'s family. Her daughter-in-law, [name], did return my call but I was with another patient and family and unable to reach her when I tried to return her call. I have left messages and unsure how else to reach them. Tomorrow is Christmas even so hopefully family will be more available and I will try and reach them again tomorrow.   All questions/concerns addressed. Emotional support provided.   Primary Decision Maker PATIENT and trying to reach son, [name]. Attempted to reach [employer] who is listed as primary HCPOA but unable to reach her and no voicemail available.     SUMMARY OF RECOMMENDATIONS   - [name] expressed desire for comfort care and acceptance for end of life today. I am trying to reach family to further discuss.   Code Status/Advance Care Planning:  Limited code - DNR would be appropriate   Symptom Management:   Per attending.   Palliative Prophylaxis:   Aspiration, Delirium Protocol, Frequent Pain Assessment, Oral Care and Turn Reposition  Additional Recommendations (Limitations, Scope, Preferences):  Patient expressed desire for  comfort care.   Psycho-social/Spiritual:   Desire for further Chaplaincy support:yes  Additional Recommendations: Education on Hospice and Grief/Bereavement Support  Prognosis:   Overall prognosis very poor. Not eating/drinking. Overall failure to thrive.   Discharge Planning: To Be Determined      Primary Diagnoses: Present on Admission: . Severe sepsis ([place]) . Benign essential HTN . Dementia ([place]) . Depression . Transaminitis . Hypocalcemia . Osteomyelitis ([place]) . History of pulmonary embolus (PE)   I have reviewed the medical record, interviewed the patient and family, and examined the patient. The following aspects are pertinent.  Past Medical History:  Diagnosis Date  . Arthritis   . Asthma   . Chronic back pain   . Depression   . History of pulmonary embolus (PE)   . Hx-TIA (transient  ischemic attack)   . Hyperlipemia   . Hypertension   . Personal history of DVT (deep vein thrombosis)   . Scoliosis   . Sleep apnea    cpap - settingsi at 2   . Stroke ([place])   . 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   Social History   Socioeconomic History  . Marital status: Widowed    Spouse name: [name]  . Number of children: 2  . Years of education: 80  . Highest education level: Not on file  Occupational History    Employer: RETIRED  Tobacco Use  . Smoking status: Former Smoker    Packs/day: 1.00    Years: 10.00    Pack years: 10.00    Types: Cigarettes    Quit date: 08/12/1973    Years since quitting: 47.0  . Smokeless tobacco: Never Used  Vaping Use  . Vaping Use: Never used  Substance and Sexual Activity  . Alcohol use: No  . Drug use: No  . Sexual activity: Not on file  Other Topics Concern  . Not on file  Social History Narrative   Patient lives at home with spouse.   Caffeine Use: occasionally   Social Determinants of Health   Financial Resource Strain: Not on file  Food Insecurity: Not on file  Transportation Needs: Not on file  Physical Activity: Not on file  Stress: Not on file  Social Connections: Not on file   Family History  Problem Relation Age of Onset  . Other Father        kidney problems   Scheduled Meds: . (feeding supplement) PROSource Plus  30 mL Oral BID BM  . busPIRone  15 mg Oral BID  . Chlorhexidine Gluconate Cloth  6 each Topical Daily  . donepezil  10 mg Oral QHS  . dronabinol  2.5 mg Oral BID AC  . feeding supplement  237 mL Oral QID  . ferrous sulfate  325 mg Oral BID WC  . linaclotide  145 mcg Oral Daily  . loratadine  10 mg Oral Daily  . memantine  28 mg Oral QHS  . multivitamin with minerals  1 tablet Oral Daily  . nutrition supplement (JUVEN)  1 packet Oral BID BM  . polyethylene glycol  17 g Oral Daily  . potassium chloride  20 mEq Oral BID  . senna-docusate  1 tablet Oral BID  . sodium chloride flush  10-40 mL  Intracatheter Q12H   Continuous Infusions: .  ceFAZolin (ANCEF) IV 2 g (08/02/20 0356)  . lactated ringers 10 mL/hr at 08/01/20 1300   PRN Meds:.acetaminophen **OR** acetaminophen, bisacodyl, bisacodyl, cycloSPORINE, menthol-cetylpyridinium **OR** phenol, morphine injection, oxyCODONE, prochlorperazine, sodium chloride flush, sodium phosphate Allergies  Allergen Reactions  . T-Pa [Alteplase] Swelling    Does okay with it if premedicated with benadryl.  . Codeine Itching   Review of Systems  Constitutional: Positive for activity change, appetite change and fatigue.  Neurological: Positive for weakness.    Physical Exam Vitals and nursing note reviewed.  Constitutional:      General: She is not in acute distress.    Appearance: She is ill-appearing.  Cardiovascular:     Rate and Rhythm: Normal rate.  Pulmonary:     Effort: Pulmonary effort is normal. No tachypnea, accessory muscle usage or respiratory distress.  Abdominal:     General: Abdomen is flat.     Palpations: Abdomen is soft.  Neurological:     Mental Status: She is alert.     Comments: Unable to recall specific information but with appropriate responses     Vital Signs: BP 131/66 (BP Location: Left Arm)   Pulse 66   Temp 98.9 F (37.2 C) (Axillary)   Resp 19   Ht 5' 2" (1.575 m)   Wt 87.4 kg   SpO2 99%   BMI 35.24 kg/m  Pain Scale: PAINAD POSS *See Group Information*: S-Acceptable,Sleep, easy to arouse Pain Score: Asleep   SpO2: SpO2: 99 % O2 Device:SpO2: 99 % O2 Flow Rate: .O2 Flow Rate (L/min): 2 L/min  IO: Intake/output summary:   Intake/Output Summary (Last 24 hours) at 08/02/2020 0834 Last data filed at 08/02/2020 4818 Gross per 24 hour  Intake 806 ml  Output 600 ml  Net 206 ml    LBM: Last BM Date: 08/01/20 Baseline Weight: Weight: 83.9 kg Most recent weight: Weight: 87.4 kg     Palliative Assessment/Data:     Time In: 1000 Time Out: 1040 Time Total: 40 min Greater than 50%  of  this time was spent counseling and coordinating care related to the above assessment and plan.  Signed by: Vinie Sill, NP Palliative Medicine Team Pager # [phone] (M-F 8a-5p) Team Phone # [phone] (Nights/Weekends)

## 2020-08-02 NOTE — Progress Notes (Signed)
Nutrition Follow-up  DOCUMENTATION CODES:   Severe malnutrition in context of acute illness/injury  INTERVENTION:   Recommend Cortrak placement and initiation of enteral nutrition if within pt's GOC. Recommend: - Start Osmolite 1.5 @ 20 ml/hr and advance by 10 ml q 8 hours to goal rate of 50 ml/hr (1200 ml/day) - ProSource TF 90 ml BID - Free water flushes of 100 ml q 4 hours  Recommended tube feeding regimen at goal rate would provide 1960 kcal, 119 grams of protein, and 914 ml of H2O.   Total free water with recommended flushes: 1514 ml  - Continue Ensure Enlive po QID, each supplement provides 350 kcal and 20 grams of protein  - Continue ProSource Plus 30 ml po BID, each supplement provides 100 kcal and 15 grams of protein  - Continue 1 packet Juven BID, each packet provides 95 calories, 2.5 grams of protein, and 9.8 grams of carbohydrate; also contains 7 grams of L-arginine and L-glutamine, 300 mg vitamin C, 15 mg vitamin E, 1.2 mcg vitamin B-12, 9.5 mg zinc, 200 mg calcium, and 1.5 g calcium Beta-hydroxy-Beta-methylbutyrate to support wound healing  NUTRITION DIAGNOSIS:   Severe Malnutrition related to acute illness as evidenced by moderate fat depletion,energy intake < or equal to 50% for > or equal to 5 days.  New diagnosis  GOAL:   Patient will meet greater than or equal to 90% of their needs  Unmet  MONITOR:   PO intake,Supplement acceptance,Skin,Weight trends,Labs,I & O's  REASON FOR ASSESSMENT:   Consult Assessment of nutrition requirement/status  ASSESSMENT:   79 y.o. 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  12/17 - s/plumbar hardware removal with lumbar wound  debridement  Discussed pt with MD and with Palliative Medicine. Pt with very poor PO intake since admission. Suspect this may be a chronic problem. Pt too lethargic today for PO medications per RN. Pt only taking sips of juices and Ensure supplements.  Recommend Cortrak placement and initiation of enteral nutrition if this is within pt's GOC. Wounds unlikely to heal with pt's current nutrition status. Pt with severe acute malnutrition.  RD unable to arouse pt at time of visit. Pt briefly opened eyes then went back to sleep. Noted food particles in pt's mouth.  Meal Completion: 0-25%  Medications reviewed and include: ProSource Plus BID, marinol BID, Ensure Enlive QID, ferrous sulfate, MVI with minerals, Juven, miralax, klor-con 20 mEq BID, senna, IV abx IVF: LR @ 10 ml/hr  Labs reviewed: hemoglobin 8.3  Diet Order:   Diet Order            DIET - DYS 1 Room service appropriate? Yes; Fluid consistency: Thin  Diet effective now                 EDUCATION NEEDS:   Not appropriate for education at this time  Skin:  Skin Assessment: Skin Integrity Issues: Stage I: sacrum Unstageable: decubitus ulcer of the distal thoracic spine  Last BM:  08/01/20 small type 1  Height:   Ht Readings from Last 1 Encounters:  07/27/20 5\' 2"  (1.575 m)    Weight:   Wt Readings from Last 1 Encounters:  08/02/20 87.4 kg    BMI:  Body mass index is 35.24 kg/m.  Estimated Nutritional Needs:   Kcal:  1900-2100  Protein:  110-120 grams  Fluid:  >/=1.9 L/day    Mertie Clause, MS, RD, LDN Inpatient Clinical Dietitian Please see AMiON for contact information.

## 2020-08-02 NOTE — Progress Notes (Signed)
Pt continues to be sleepy throughout shift. Meds held and MD aware. Mayford Knife RN

## 2020-08-02 NOTE — Progress Notes (Signed)
Physical Therapy Treatment Patient Details Name: [name] MRN: [id number] DOB: Apr 30, 1941 Today's Date: 08/02/2020    History of Present Illness [name] is an 79 y.o. 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 Pt also noted with exposed hardward. Pt underwent I&D of wound and removal of spinal fusion hardware on 12/16.    PT Comments    Pt very lethargic today demonstrating difficulty tolerating/participating in therapy. Bed placed in chair position. Pt waking for brief periods of time. Attempted pull forward to sit using R hand on lower bed rail. Pt unable to complete even with +2 total assist due to pain. Edema noted LUE and RLE, unable to tolerate ROM on either. A/AAROM performed LLE and RUE. At end of session, pt transitioned out of chair position. Pt then transitioned to L sidelying using pillows for support.      Follow Up Recommendations  SNF;Supervision/Assistance - 24 hour     Equipment Recommendations  Other (comment) (defer to post acute)    Recommendations for Other Services       Precautions / Restrictions Precautions Precautions: Fall Precaution Comments: L-sided weakness from prior CVA    Mobility  Bed Mobility Overal bed mobility: Needs Assistance   Rolling: Total assist         General bed mobility comments: total assist rolling for positioning in L sidelying. Attempted pull to sit using lower bed rails with bed in chair position. No active participation from pt. Pt grimacing in pain with attempt of +2 total assist.  Transfers Overall transfer level: Needs  assistance               General transfer comment: Pt will require maximove for OOB.  Ambulation/Gait                 Stairs             Wheelchair Mobility    Modified Rankin (Stroke Patients Only)       Balance                                            Cognition Arousal/Alertness: Lethargic Behavior During Therapy: Flat affect Overall Cognitive Status: Difficult to assess                                 General Comments: Pt very sleepy today. Able to stay awake for brief periods when bed transitioned to chair position.      Exercises      General Comments General comments (skin integrity, edema, etc.): VSS on 2L      Pertinent Vitals/Pain Pain Assessment: Faces Faces Pain Scale: Hurts even more Pain Location: RLE with ROM and palpation Pain Descriptors / Indicators: Grimacing;Moaning;Discomfort Pain Intervention(s): Limited activity within patient's tolerance;Monitored during session;Repositioned    Home Living                      Prior Function  PT Goals (current goals can now be found in the care plan section) Acute Rehab PT Goals Patient Stated Goal: not stated Progress towards PT goals: Progressing toward goals    Frequency    Min 3X/week      PT Plan Current plan remains appropriate    Co-evaluation              AM-PAC PT "6 Clicks" Mobility   Outcome Measure  Help needed turning from your back to your side while in a flat bed without using bedrails?: Total Help needed moving from lying on your back to sitting on the side of a flat bed without using bedrails?: Total Help needed moving to and from a bed to a chair (including a wheelchair)?: Total Help needed standing up from a chair using your arms (e.g., wheelchair or bedside chair)?: Total Help needed to walk in hospital room?: Total Help needed climbing 3-5 steps with a railing? : Total 6 Click Score: 6     End of Session Equipment Utilized During Treatment: Oxygen Activity Tolerance: Patient limited by pain;Patient limited by lethargy Patient left: in bed;with call bell/phone within reach;with bed alarm set Nurse Communication: Mobility status;Need for lift equipment PT Visit Diagnosis: Other abnormalities of gait and mobility (R26.89);Muscle weakness (generalized) (M62.81);Pain Pain - Right/Left: Right Pain - part of body: Leg     Time: 1115-1131 PT Time Calculation (min) (ACUTE ONLY): 16 min  Charges:  $Therapeutic Activity: 8-22 mins                     Aida Raider, PT  Office # [phone] Pager [phone]    Ilda Foil 08/02/2020, 12:45 PM

## 2020-08-02 NOTE — Progress Notes (Signed)
Patient ID: [name], female   DOB: 1941/05/31, 79 y.o.   MRN: [id number] Vital signs appear stable and clinically patient is stable incisions remain clean and dry.  Dressing changes continue daily.  We will continue to observe.  Leave staples in place

## 2020-08-02 NOTE — Progress Notes (Signed)
Palliative:  I have reached out to family multiple times today without success. I left voicemail for [employer] and Malachi Pro (who are listed as Ms. Stepney's HCPOA - in that order on her electronic copy of HCPOA document). I was unable to leave voicemail for Carlyn as her voicemail was full but left voicemail requesting return call from both [place] and his wife Jola Babinski.   Will continue to make attempts to speak with family regarding goals of care.   No charge  Yong Channel, NP Palliative Medicine Team Pager [phone] (Please see [email or web address] for schedule) Team Phone [phone]

## 2020-08-03 ENCOUNTER — Inpatient Hospital Stay (HOSPITAL_COMMUNITY): Payer: Medicare Other

## 2020-08-03 DIAGNOSIS — Z7189 Other specified counseling: Secondary | ICD-10-CM

## 2020-08-03 DIAGNOSIS — M25561 Pain in right knee: Secondary | ICD-10-CM

## 2020-08-03 DIAGNOSIS — Z515 Encounter for palliative care: Secondary | ICD-10-CM

## 2020-08-03 LAB — CBC WITH DIFFERENTIAL/PLATELET
Abs Immature Granulocytes: 0.08 10*3/uL — ABNORMAL HIGH (ref 0.00–0.07)
Basophils Absolute: 0 10*3/uL (ref 0.0–0.1)
Basophils Relative: 0 %
Eosinophils Absolute: 0.2 10*3/uL (ref 0.0–0.5)
Eosinophils Relative: 1 %
HCT: 27.3 % — ABNORMAL LOW (ref 36.0–46.0)
Hemoglobin: 8.2 g/dL — ABNORMAL LOW (ref 12.0–15.0)
Immature Granulocytes: 1 %
Lymphocytes Relative: 14 %
Lymphs Abs: 1.8 10*3/uL (ref 0.7–4.0)
MCH: 28.2 pg (ref 26.0–34.0)
MCHC: 30 g/dL (ref 30.0–36.0)
MCV: 93.8 fL (ref 80.0–100.0)
Monocytes Absolute: 0.8 10*3/uL (ref 0.1–1.0)
Monocytes Relative: 7 %
Neutro Abs: 9.7 10*3/uL — ABNORMAL HIGH (ref 1.7–7.7)
Neutrophils Relative %: 77 %
Platelets: 256 10*3/uL (ref 150–400)
RBC: 2.91 MIL/uL — ABNORMAL LOW (ref 3.87–5.11)
RDW: 19.2 % — ABNORMAL HIGH (ref 11.5–15.5)
WBC: 12.6 10*3/uL — ABNORMAL HIGH (ref 4.0–10.5)
nRBC: 0.2 % (ref 0.0–0.2)

## 2020-08-03 LAB — BASIC METABOLIC PANEL
Anion gap: 9 (ref 5–15)
BUN: 5 mg/dL — ABNORMAL LOW (ref 8–23)
CO2: 27 mmol/L (ref 22–32)
Calcium: 8.6 mg/dL — ABNORMAL LOW (ref 8.9–10.3)
Chloride: 104 mmol/L (ref 98–111)
Creatinine, Ser: 0.41 mg/dL — ABNORMAL LOW (ref 0.44–1.00)
GFR, Estimated: >60 mL/min (ref >60)
Glucose, Bld: 77 mg/dL (ref 70–99)
Potassium: 3.6 mmol/L (ref 3.5–5.1)
Sodium: 140 mmol/L (ref 135–145)

## 2020-08-03 LAB — MAGNESIUM: Magnesium: 1.8 mg/dL (ref 1.7–2.4)

## 2020-08-03 NOTE — Assessment & Plan Note (Signed)
-  Likely underlying osteoarthritis -X-ray obtained on 08/03/2020 shows degenerative changes and small suprapatellar knee joint effusion which is where her pain is as well -Monitor for any signs of fever or patient becoming septic.  If develops, will need to further investigate the effusion

## 2020-08-03 NOTE — TOC Progression Note (Signed)
Transition of Care [facility]) - Progression Note    Patient Details  Name: ZANNAH MELUCCI MRN: [id number] Date of Birth: 1940-11-26  Transition [facility]) CM/SW Contact  Eduard Roux, [place] Phone Number: 08/03/2020, 3:09 PM  Clinical Narrative:     CSW called patient's son,[name]- received no answer.  Antony Blackbird, MSW, LCSW Clinical Social Worker   Expected Discharge Plan: Skilled Nursing Facility Barriers to Discharge: SNF Pending bed offer  Expected Discharge Plan and Services Expected Discharge Plan: Skilled Nursing Facility       Living arrangements for the past 2 months: Single Family Home                                       Social Determinants of Health (SDOH) Interventions    Readmission Risk Interventions No flowsheet data found.

## 2020-08-03 NOTE — Progress Notes (Signed)
Patient daughter in law called for update. Informed palliative made attempts to contact pt son/family yesterday for update given contact info to call palliative.

## 2020-08-03 NOTE — Progress Notes (Signed)
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  She presented with sepsis and after work-up was found to have an unstageable ulcer of the distal thoracic/lumbar spine.  She underwent removal of metal hardware in her back with neurosurgery on 07/27/2020. Infectious disease was also consulted and ultimately she is recommended to continue on 6 weeks of antibiotics which were deescalated down to Ancef. She was started on a heparin drip after surgery to monitor hemoglobin.  Unfortunately, hemoglobin continued to downtrend and she required blood transfusion on 08/01/2020.  Due to this, she was considered a poor candidate to continue on anticoagulation.  She has been on anticoagulation outpatient for approximately 3 months with Eliquis.  She also has an IVC in place from September 2021.   Interval History:  No events overnight.  Complaining of her right knee hurting this morning.  X-ray was ordered which revealed underlying degenerative changes and a small effusion.  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 intake and probable component of hemodilution and acute blood loss possibly from left upper extremity hematoma -Hemoglobin down to 6.9 g/dL on 27/02/8674, 1 unit PRBC being transfused -Discontinued heparin drip on 12/21.  No further anticoagulation -Continue monitoring hemoglobin at least another 24 hours  History of DVT (deep vein thrombosis) - 05/06/20 and 07/22/20: Right CFV, femoral vein, prox profunda vein, popliteal vein, posterior tibial vein, peroneal vein.  Left posterior tibial vein, peroneal vein.  -Of note, also developed a left upper extremity hematoma -Previously on Eliquis outpatient, has been on heparin drip during hospitalization.  She has now had drop in hemoglobin requiring blood transfusion on 08/01/2020.  No longer considered a good candidate for ongoing long-term anticoagulation - IVC placed in Sept 2021  History of pulmonary embolus (PE) -05/05/2020: Large bilateral acute PE including saddle embolus and large clot burden.  Echo suggests right heart strain as well - s/p Eliquis outpatient and heparin inpatient - Hgb continues to downtrend - d/c anticoagulation at this time; poor candidate for further use  Dementia (HCC) Alert to self and place Continue BuSpar, Aricept, Namenda, delirium precautions  Right knee pain -Likely underlying osteoarthritis -X-ray obtained on 08/03/2020 shows degenerative changes and small suprapatellar knee joint effusion which is where her pain is as well -Monitor for any signs of fever or patient becoming septic.  If develops, will need to further investigate the effusion  Pressure injury, unstageable (HCC) - continue dressing changes  Osteomyelitis (HCC) -  see  sepsis  Hypocalcemia -Replete and recheck as needed  Benign essential HTN -Blood pressure stable -On amlodipine, Lasix, lisinopril at home; on hold for now  Antimicrobials: Ancef  DVT prophylaxis: SCD Code Status: Partial Family Communication:  Disposition Plan: Status is: Inpatient  Remains inpatient appropriate because:IV treatments appropriate due to intensity of illness or inability to take PO and Inpatient level of care appropriate due to severity of illness   Dispo:  Patient From: Home  Planned Disposition: Skilled Nursing Facility  Expected discharge date: 08/04/2020  Medically stable for discharge: No  Objective: Blood pressure (!) 150/80, pulse 80, temperature 98.8 F (37.1 C), resp. rate 16, height 5\' 2"  (1.575 m), weight 85.5 kg, SpO2 99 %.  Examination: General appearance: alert, cooperative and no distress Head: Normocephalic, without obvious abnormality, atraumatic Eyes: EOMI Lungs: clear to auscultation bilaterally Heart: regular rate and rhythm and S1, S2 normal Abdomen: normal findings: bowel sounds normal and soft, non-tender Extremities: no edema Skin: mobility and turgor normal Neurologic: LUE 1/5, 5/5 all others  Consultants:   Neurosurgery  PCM  Procedures:     Data Reviewed: I have personally reviewed following labs and imaging studies Results for orders placed or performed during the hospital encounter of 07/21/20 (from the past 24 hour(s))  Magnesium     Status: None   Collection Time: 08/03/20  3:09 AM  Result Value Ref Range   Magnesium 1.8 1.7 - 2.4 mg/dL  CBC with Differential/Platelet     Status: Abnormal   Collection Time: 08/03/20  3:09 AM  Result Value Ref Range   WBC 12.6 (H) 4.0 - 10.5 K/uL   RBC 2.91 (L) 3.87 - 5.11 MIL/uL   Hemoglobin 8.2 (L) 12.0 - 15.0 g/dL   HCT 08/05/20 (L) 90.2 - 40.9 %   MCV 93.8 80.0 - 100.0 fL   MCH 28.2 26.0 - 34.0 pg   MCHC 30.0 30.0 - 36.0 g/dL   RDW 73.5 (H) 32.9 - 92.4 %   Platelets 256  150 - 400 K/uL   nRBC 0.2 0.0 - 0.2 %   Neutrophils Relative % 77 %   Neutro Abs 9.7 (H) 1.7 - 7.7 K/uL   Lymphocytes Relative 14 %   Lymphs Abs 1.8 0.7 - 4.0 K/uL   Monocytes Relative 7 %   Monocytes Absolute 0.8 0.1 - 1.0 K/uL   Eosinophils Relative 1 %   Eosinophils Absolute 0.2 0.0 - 0.5 K/uL   Basophils Relative 0 %   Basophils Absolute 0.0 0.0 - 0.1 K/uL   Immature Granulocytes 1 %   Abs Immature Granulocytes 0.08 (H) 0.00 - 0.07 K/uL  Basic metabolic panel     Status: Abnormal   Collection Time: 08/03/20  3:09 AM  Result Value Ref Range   Sodium 140 135 - 145 mmol/L   Potassium 3.6 3.5 - 5.1 mmol/L   Chloride 104 98 - 111 mmol/L   CO2 27 22 - 32 mmol/L   Glucose, Bld 77 70 - 99 mg/dL   BUN 5 (L) 8 - 23 mg/dL   Creatinine, Ser 08/05/20 (L) 0.44 - 1.00 mg/dL   Calcium 8.6 (L) 8.9 - 10.3 mg/dL   GFR, Estimated 3.41 >96 mL/min   Anion gap 9 5 - 15    No results found for this or any previous visit (from the past 240 hour(s)).   Radiology Studies: DG Knee 1-2 Views Right  Result Date: 08/03/2020 CLINICAL DATA:  Right knee pain EXAM: RIGHT KNEE - 1-2 VIEW  COMPARISON:  None. FINDINGS: Mild degenerative changes, most prominent in the medial and patellofemoral compartments. No fracture or dislocation is seen. Visualized soft tissues are within normal limits. Suspected small suprapatellar knee joint effusion. IMPRESSION: Mild degenerative changes with small suprapatellar knee joint effusion. Electronically Signed   By: Charline Bills M.D.   On: 08/03/2020 10:26   DG Knee 1-2 Views Right  Final Result    VAS [place] LOWER EXTREMITY VENOUS (DVT)  Final Result    VAS [place] UPPER EXTREMITY VENOUS DUPLEX  Final Result    [place] EKG SITE RITE  Final Result    VAS [place] LOWER EXTREMITY VENOUS (DVT)  Final Result    CT ABDOMEN PELVIS W CONTRAST  Final Result    CT L-SPINE NO CHARGE  Final Result      Scheduled Meds: . (feeding supplement) PROSource Plus  30 mL Oral BID BM  .  busPIRone  15 mg Oral BID  . Chlorhexidine Gluconate Cloth  6 each Topical Daily  . donepezil  10 mg Oral QHS  . dronabinol  2.5 mg Oral BID AC  . feeding supplement  237 mL Oral QID  . ferrous sulfate  325 mg Oral BID WC  . linaclotide  145 mcg Oral Daily  . loratadine  10 mg Oral Daily  . memantine  28 mg Oral QHS  . multivitamin with minerals  1 tablet Oral Daily  . nutrition supplement (JUVEN)  1 packet Oral BID BM  . polyethylene glycol  17 g Oral Daily  . potassium chloride  20 mEq Oral BID  . senna-docusate  1 tablet Oral BID  . sodium chloride flush  10-40 mL Intracatheter Q12H   PRN Meds: acetaminophen **OR** acetaminophen, bisacodyl, bisacodyl, cycloSPORINE, menthol-cetylpyridinium **OR** phenol, morphine injection, oxyCODONE, prochlorperazine, sodium chloride flush, sodium phosphate Continuous Infusions: .  ceFAZolin (ANCEF) IV 2 g (08/03/20 1230)  . lactated ringers 10 mL/hr at 08/02/20 1905     LOS: 13 days  Time spent: Greater than 50% of the 35 minute visit was spent in counseling/coordination of care for the patient as laid out in the A&P.   Lewie Chamber, MD [facility] 08/03/2020, 1:21 PM

## 2020-08-03 NOTE — Progress Notes (Signed)
Civil engineer, contracting [facility])   This patient is enrolled in our palliative care services in the community.  [facility] will continue to follow for any discharge planning needs and to coordinate continuation of palliative care.  Please do not hesitate to call with questions.   Wallis Bamberg RN, BSN, CCRN [facility] Liaison

## 2020-08-03 NOTE — Progress Notes (Addendum)
Patient ID: [name], female   DOB: 09-16-1940, 79 y.o.   MRN: [id number] Signs appear stable.  Motor function remains good level of consciousness seems slightly improved.  Dressing on her back was changed today and the surgical site does appear to be stable.  Plan is to continue dressing changes.  Disposition to any facility can be provided at any time.  I will be out of town for the next week.  If any neurosurgical question exists please call [facility] but I believe that she is stable to be transferred and I would want to see the patient clinically in about 3 weeks as an outpatient.  I would leave the staples in for that duration of time.

## 2020-08-04 LAB — CBC WITH DIFFERENTIAL/PLATELET
Abs Immature Granulocytes: 0.1 10*3/uL — ABNORMAL HIGH (ref 0.00–0.07)
Basophils Absolute: 0 10*3/uL (ref 0.0–0.1)
Basophils Relative: 0 %
Eosinophils Absolute: 0.1 10*3/uL (ref 0.0–0.5)
Eosinophils Relative: 1 %
HCT: 28.4 % — ABNORMAL LOW (ref 36.0–46.0)
Hemoglobin: 8.6 g/dL — ABNORMAL LOW (ref 12.0–15.0)
Immature Granulocytes: 1 %
Lymphocytes Relative: 7 %
Lymphs Abs: 1.2 10*3/uL (ref 0.7–4.0)
MCH: 28.3 pg (ref 26.0–34.0)
MCHC: 30.3 g/dL (ref 30.0–36.0)
MCV: 93.4 fL (ref 80.0–100.0)
Monocytes Absolute: 1 10*3/uL (ref 0.1–1.0)
Monocytes Relative: 6 %
Neutro Abs: 14.3 10*3/uL — ABNORMAL HIGH (ref 1.7–7.7)
Neutrophils Relative %: 85 %
Platelets: 250 10*3/uL (ref 150–400)
RBC: 3.04 MIL/uL — ABNORMAL LOW (ref 3.87–5.11)
RDW: 19.1 % — ABNORMAL HIGH (ref 11.5–15.5)
WBC: 16.7 10*3/uL — ABNORMAL HIGH (ref 4.0–10.5)
nRBC: 0.1 % (ref 0.0–0.2)

## 2020-08-04 LAB — BASIC METABOLIC PANEL
Anion gap: 8 (ref 5–15)
BUN: 7 mg/dL — ABNORMAL LOW (ref 8–23)
CO2: 27 mmol/L (ref 22–32)
Calcium: 8.8 mg/dL — ABNORMAL LOW (ref 8.9–10.3)
Chloride: 101 mmol/L (ref 98–111)
Creatinine, Ser: 0.39 mg/dL — ABNORMAL LOW (ref 0.44–1.00)
GFR, Estimated: >60 mL/min (ref >60)
Glucose, Bld: 80 mg/dL (ref 70–99)
Potassium: 4 mmol/L (ref 3.5–5.1)
Sodium: 136 mmol/L (ref 135–145)

## 2020-08-04 LAB — MAGNESIUM: Magnesium: 1.8 mg/dL (ref 1.7–2.4)

## 2020-08-04 NOTE — Progress Notes (Signed)
This nurse wasted 1.5 mg (half tablet) of oxycodone Emogene Morgan, RN witnessed.

## 2020-08-04 NOTE — TOC Progression Note (Signed)
Transition of Care [facility]) - Progression Note    Patient Details  Name: [name] MRN: [id number] Date of Birth: 1941-03-23  Transition of Care [facility]) CM/SW Contact  Eduard Roux, [place] Phone Number: 08/04/2020, 11:14 AM  Clinical Narrative:     CSW spoke with the patient's son, Maren Beach. CSW informed him Palliative Care NP was trying to reach the family. CSW inquired about the patient's discharge plan - patient's son confirmed the plan is SNF.  CSW informed patient of bed offers with [facility], which is the only bed offer. Patient's son requested to inquire with [facility] and advise they plan on taking the patient home after rehab.   CSW contacted [facility]- they confirmed bed and is agreeable to bed offer as long as the family plan is to take the patient home after rehab. CSW called and confirmed the offer with the patient's son. He has requested to let him discuss briefly with his wife and he will call CSW back  Shortly. CSW advised, per MD notes it appears she stable for discharge.   CSW will continue to follow and assist with discharge plan.  Antony Blackbird, MSW, LCSW Clinical Social Worker   Expected Discharge Plan: Skilled Nursing Facility Barriers to Discharge: SNF Pending bed offer  Expected Discharge Plan and Services Expected Discharge Plan: Skilled Nursing Facility       Living arrangements for the past 2 months: Single Family Home                                       Social Determinants of Health (SDOH) Interventions    Readmission Risk Interventions No flowsheet data found.

## 2020-08-04 NOTE — TOC Progression Note (Signed)
Transition of Care [facility]) - Progression Note    Patient Details  Name: [name] MRN: [id number] Date of Birth: [birth year]  Transition of Care [facility]) CM/SW Contact  Eduard Roux, [place] Phone Number: 08/04/2020, 12:31 PM  Clinical Narrative:     Patient's son has accepted and confirm discharge plan to SNF. [facility] can admit patient patient on Sunday.  CSW will continue to follow and assist with discharge planning.   Antony Blackbird, MSW, LCSW Clinical Social Worker   Expected Discharge Plan: Skilled Nursing Facility Barriers to Discharge: SNF Pending bed offer  Expected Discharge Plan and Services Expected Discharge Plan: Skilled Nursing Facility       Living arrangements for the past 2 months: Single Family Home                                       Social Determinants of Health (SDOH) Interventions    Readmission Risk Interventions No flowsheet data found.

## 2020-08-04 NOTE — Progress Notes (Signed)
Occupational Therapy Treatment Patient Details Name: [name] MRN: [id number] DOB: 06-19-1941 Today's Date: 08/04/2020    History of present illness [name] is an 79 y.o. 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 Pt also noted with exposed hardward. Pt underwent I&D of wound and removal of spinal fusion hardware on 12/16.   OT comments  Pt seen in conjunction with PT.  She required total A +2 for bed mobility and moving supine <> sit.  She required max A, overall, to maintain EOB sitting.  Attempted to engage her in simple grooming tasks, but pt with limited (<5%) participation citing pain and request to lie back down.  She was assisted back to bed as we were unable to transfer her out of bed without use of lift equipment and pt requesting not to transfer.  Pt is not progressing toward goals.  May need to downgrade goals next visit if no significant improvement.    Follow Up Recommendations  SNF;Supervision/Assistance - 24 hour    Equipment Recommendations  Wheelchair (measurements OT);Wheelchair cushion (measurements OT);Hospital bed;Other (comment)    Recommendations for Other Services      Precautions / Restrictions Precautions Precautions: Fall Precaution Comments: L-sided weakness from prior CVA Restrictions Weight Bearing Restrictions: No       Mobility Bed Mobility Overal bed mobility: Needs Assistance Bed Mobility: Supine to Sit;Sit to Sidelying     Supine to sit: Total assist;+2 for physical assistance;+2 for safety/equipment   Sit to sidelying: Total assist;+2 for physical  assistance;+2 for safety/equipment General bed mobility comments: totalA to move BLE to EOB and raise trunk. maxA to maintain sitting balance. totalA to reposition to sidelying in bed with assist at BLE and trunk  Transfers                 General transfer comment: pt unable, would require maximove    Balance Overall balance assessment: Needs assistance Sitting-balance support: Bilateral upper extremity supported;Feet supported Sitting balance-Leahy Scale: Zero Sitting balance - Comments: max-totalA to maintain upright. therapist sitting on L side due to strong L and forward lean. increased assist needed with fatigue Postural control: Left lateral lean                                 ADL either performed or assessed with clinical judgement   ADL Overall ADL's : Needs assistance/impaired Eating/Feeding: Total assistance   Grooming: Total assistance;Sitting                                 General ADL Comments: Pt only assisted ~5-10% with simple grooming tasks despite max encouragement     Vision       Perception     Praxis      Cognition Arousal/Alertness: Awake/alert Behavior During Therapy: Flat affect Overall Cognitive Status: No family/caregiver present to determine baseline cognitive functioning  General Comments: Pt with flat affect asking to return to supine due to pain through session. Declined multiple offers for assist with tasks such as washing her face or putting on lotion, but eventually agreeable with max encouragement. pt responding to most prompts/questions with asking to lay back down.  Pt with h/o dementia        Exercises Exercises: General Lower Extremity General Exercises - Lower Extremity Ankle Circles/Pumps: AROM;Both;15 reps;Seated Short Arc Quad: AAROM;5 reps;Both;Seated   Shoulder Instructions       General Comments pt left in sidelying position with soft touch  call bell in reach. pt unable to move RUE to reach or press for assist and states she normally yells "help" instead    Pertinent Vitals/ Pain       Pain Assessment: Faces Faces Pain Scale: Hurts whole lot Pain Location: RLE with knee flexion, back, RUE Pain Descriptors / Indicators: Grimacing;Moaning;Discomfort Pain Intervention(s): Limited activity within patient's tolerance;Premedicated before session  Home Living                                          Prior Functioning/Environment              Frequency  Min 2X/week        Progress Toward Goals  OT Goals(current goals can now be found in the care plan section)     Acute Rehab OT Goals Patient Stated Goal: to return to supine and reduce pain  Plan Discharge plan remains appropriate    Co-evaluation    PT/OT/SLP Co-Evaluation/Treatment: Yes Reason for Co-Treatment: Complexity of the patient's impairments (multi-system involvement);Necessary to address cognition/behavior during functional activity;To address functional/ADL transfers PT goals addressed during session: Mobility/safety with mobility;Balance;Strengthening/ROM OT goals addressed during session: ADL's and self-care      AM-PAC OT "6 Clicks" Daily Activity     Outcome Measure                    End of Session    OT Visit Diagnosis: Other abnormalities of gait and mobility (R26.89);Muscle weakness (generalized) (M62.81);Other symptoms and signs involving cognitive function;Pain;Hemiplegia and hemiparesis Hemiplegia - Right/Left: Left Hemiplegia - dominant/non-dominant: Non-Dominant Hemiplegia - caused by: Cerebral infarction Pain - Right/Left: Right   Activity Tolerance Patient limited by pain   Patient Left in bed;with call bell/phone within reach;with nursing/sitter in room   Nurse Communication Mobility status        Time: 1275-1700 OT Time Calculation (min): 23 min  Charges: OT General Charges $OT Visit: 1  Visit OT Treatments $Neuromuscular Re-education: 8-22 mins  Eber Jones OTR/L Acute Rehabilitation Services Pager [phone] Office [phone]    Jeani Hawking M 08/04/2020, 1:38 PM

## 2020-08-04 NOTE — Progress Notes (Signed)
Palliative:  HPI: 79 y.o. female  with past medical history of PE/DVT s/p IVC filter 9/2021on Eliquis, stroke, vascular dementia, unstageable decubitus ulcer to distal thoracic spine (home health wound care), chronic pain admitted on 07/21/2020 with worsening thoracic spine wound with sepsis and osteomyelitis. Removal of metal hardware in back 07/27/20. Plans for 6 weeks antibiotics. Followed by outpatient palliative care via [facility]. Family have previously declined hospice services per notes.   I met today with [name]. I returned to bedside when son, [name], present. She is alert and able to communicate well again today. [name] shares with me that his mother often expresses desire to die when in pain. I explained that I was very concerned when she expressed this to me yesterday and that she also denied pain at that time. Today she endorses desire to continue with current care and desire to live.   I discussed further with [name] that I am extremely concerned with her poor intake as she will not be able to heal wound at all with poor nutrition. He shares frustration that he has communicated to many people that she cannot eat pureed food hospital brings but eats baby food containers at home. He reports that she has 2 containers of baby food and prostat a day at home. I tell him that she will need more calories than this to sustain herself very long. I encouraged him to bring baby food to bedside so that we can provide nutrition. He will also need to provide this at facility if this is all she will eat.   Overall goals are clear that family desire continued aggressive care. They are open to continued outpatient palliative care follow up.   Exam: Alert, appropriate responses. No distress. Pleasant demeanor. Breathing regular, unlabored. Generalized weakness and fatigue.   Plan: - SNF with palliative to follow.  - Continue aggressive care.   25 min  Vinie Sill, NP Palliative Medicine  Team Pager [phone] (Please see [email or web address] for schedule) Team Phone [phone]    Greater than 50%  of this time was spent counseling and coordinating care related to the above assessment and plan

## 2020-08-04 NOTE — Progress Notes (Signed)
Physical Therapy Treatment Patient Details Name: [name] MRN: [id number] DOB: 02-13-1941 Today's Date: 08/04/2020    History of Present Illness [name] is an 79 y.o. 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 Pt also noted with exposed hardward. Pt underwent I&D of wound and removal of spinal fusion hardware on 12/16.    PT Comments    The pt was seen by PT/OT in conjunction at this time to improve safety with transitions and mobility. The pt was agreeable to session, but reports increased discomfort when transitioned to sitting EOB and asked to return to supine. The pt was able to sustain sitting EOB with maxA provided on her L side to maintain stability for ~10 -12 minutes. The pt currently requires significant assist of 2 to complete all bed mobility and static sitting, she was unable to complete any OOB transfers at this time, and would require lift equipment to complete safely. The pt remains limited by significant pain with all mobility, limited strength, endurance, and stability, and could continue to benefit from therapies as it aligns with pt's goals of care.    Follow Up Recommendations  SNF;Supervision/Assistance - 24 hour     Equipment Recommendations   (defer to post acute)    Recommendations for Other Services       Precautions / Restrictions Precautions Precautions: Fall Precaution Comments: L-sided weakness from prior CVA Restrictions Weight Bearing Restrictions: No    Mobility  Bed Mobility Overal bed mobility: Needs Assistance Bed Mobility: Supine to Sit;Sit to Sidelying      Supine to sit: Total assist;+2 for physical assistance;+2 for safety/equipment   Sit to sidelying: Total assist;+2 for physical assistance;+2 for safety/equipment General bed mobility comments: totalA to move BLE to EOB and raise trunk. maxA to maintain sitting balance. totalA to reposition to sidelying in bed with assist at BLE and trunk  Transfers                 General transfer comment: pt unable, would require maximove        Balance Overall balance assessment: Needs assistance Sitting-balance support: Bilateral upper extremity supported;Feet supported Sitting balance-Leahy Scale: Zero Sitting balance - Comments: max-totalA to maintain upright. therapist sitting on L side due to strong L and forward lean. increased assist needed with fatigue Postural control: Left lateral lean                                  Cognition Arousal/Alertness: Awake/alert Behavior During Therapy: Flat affect Overall Cognitive Status: No family/caregiver present to determine baseline cognitive functioning                                 General Comments: Pt with flat affect asking to return to supine due to pain through session. Declined multiple offers for assist with tasks such as washing her face or putting on lotion, but eventually agreeable with max encouragement. pt responding to most prompts/questions with asking to lay back down      Exercises General  Exercises - Lower Extremity Ankle Circles/Pumps: AROM;Both;15 reps;Seated Short Arc Quad: AAROM;5 reps;Both;Seated    General Comments General comments (skin integrity, edema, etc.): pt left in sidelying position with soft touch call bell in reach. pt unable to move RUE to reach or press for assist and states she normally yells "help" instead      Pertinent Vitals/Pain Pain Assessment: Faces Faces Pain Scale: Hurts whole lot Pain Location: RLE with knee flexion, back, RUE Pain Descriptors /  Indicators: Grimacing;Moaning;Discomfort Pain Intervention(s): Limited activity within patient's tolerance;Monitored during session;Premedicated before session;Repositioned           PT Goals (current goals can now be found in the care plan section) Acute Rehab PT Goals Patient Stated Goal: to return to supine and reduce pain PT Goal Formulation: With patient Time For Goal Achievement: 08/11/20 Potential to Achieve Goals: Fair Progress towards PT goals: Progressing toward goals    Frequency    Min 3X/week      PT Plan Current plan remains appropriate    Co-evaluation PT/OT/SLP Co-Evaluation/Treatment: Yes Reason for Co-Treatment: Complexity of the patient's impairments (multi-system involvement);For patient/therapist safety;To address functional/ADL transfers PT goals addressed during session: Mobility/safety with mobility;Balance;Strengthening/ROM        AM-PAC PT "6 Clicks" Mobility   Outcome Measure  Help needed turning from your back to your side while in a flat bed without using bedrails?: Total Help needed moving from lying on your back to sitting on the side of a flat bed without using bedrails?: Total Help needed moving to and from a bed to a chair (including a wheelchair)?: Total Help needed standing up from a chair using your arms (e.g., wheelchair or bedside chair)?: Total Help needed to walk in hospital room?: Total Help needed climbing 3-5 steps with a railing? : Total 6 Click Score: 6    End of Session Equipment Utilized During Treatment: Oxygen Activity Tolerance: Patient limited by pain;Patient limited by lethargy Patient left: in bed;with call bell/phone within reach;with bed alarm set Nurse Communication: Mobility status;Need for lift equipment PT Visit Diagnosis: Other abnormalities of gait and mobility (R26.89);Muscle weakness (generalized) (M62.81);Pain Pain - Right/Left: Right Pain - part of body: Arm;Leg     Time: 0347-4259 PT Time  Calculation (min) (ACUTE ONLY): 26 min  Charges:  $Therapeutic Activity: 8-22 mins                     Rolm Baptise, PT, DPT   Acute Rehabilitation Department Pager #: [phone]   Gaetana Michaelis 08/04/2020, 12:39 PM

## 2020-08-04 NOTE — Progress Notes (Signed)
PROGRESS NOTE    ANACLARA ACKLIN   ZOX:[id number]  DOB: 01-18-1941  DOA: 07/21/2020     14  PCP: [name], MD  CC: wound on back  Hospital Course: Ms. 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  She presented with sepsis and after work-up was found to have an unstageable ulcer of the distal thoracic/lumbar spine.  She underwent removal of metal hardware in her back with neurosurgery on 07/27/2020. Infectious disease was also consulted and ultimately she is recommended to continue on 6 weeks of antibiotics which were deescalated down to Ancef. She was started on a heparin drip after surgery to monitor hemoglobin.  Unfortunately, hemoglobin continued to downtrend and she required blood transfusion on 08/01/2020.  Due to this, she was considered a poor candidate to continue on anticoagulation.  She has been on anticoagulation outpatient for approximately 3 months with Eliquis.  She also has an IVC in place from September 2021.   Interval History:  No events overnight.  Resting in bed a little more comfortable this morning.  Still having pain in her right knee.  Updated her on findings of the x-ray indicating arthritis.  Tentative plan is for discharging her to SNF on Sunday.  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 intake and probable component of hemodilution and acute blood loss possibly from left upper extremity hematoma -Hemoglobin down to 6.9 g/dL on 81/19/1478, 1 unit PRBC being transfused -Discontinued heparin drip on 12/21.  No further anticoagulation -Hemoglobin remains stable especially off of anticoagulation.  At this time she is stable for discharging to SNF when bed available  History of DVT (deep vein thrombosis) - 05/06/20 and 07/22/20: Right CFV, femoral vein, prox profunda vein, popliteal vein, posterior tibial vein, peroneal vein.  Left posterior tibial vein, peroneal vein.  -Of note, also developed a left upper extremity hematoma -Previously on Eliquis outpatient, has been on heparin drip during hospitalization.  She has now had drop in hemoglobin requiring blood transfusion on 08/01/2020.  No longer considered a good candidate for ongoing long-term anticoagulation - IVC placed in Sept 2021  History of pulmonary embolus (PE) -05/05/2020: Large bilateral acute PE including saddle embolus and large clot burden.  Echo suggests right heart strain as well - s/p Eliquis outpatient and heparin inpatient - Hgb continues to downtrend - d/c anticoagulation at this time; poor candidate for further use  Dementia (HCC) Alert to self and place Continue BuSpar, Aricept, Namenda, delirium precautions -Palliative care has followed patient during hospitalization and she was also enrolled with palliative care outpatient prior to hospitalization.  Family is wishing for aggressive measures despite patient wishes for  pursuing more of a comfort care route.  Difficult in setting of underlying dementia  diagnosis.  Palliative care will continue to follow along at discharge.  Right knee pain -Likely underlying osteoarthritis -X-ray obtained on 08/03/2020 shows degenerative changes and small suprapatellar knee joint effusion which is where her pain is as well -Monitor for any signs of fever or patient becoming septic.  If develops, will need to further investigate the effusion  Pressure injury, unstageable (HCC) - continue dressing changes  Osteomyelitis (HCC) - see sepsis  Hypocalcemia -Replete and recheck as needed  Benign essential HTN -Blood pressure stable -On amlodipine, Lasix, lisinopril at home; on hold for now  Antimicrobials: Ancef  DVT prophylaxis: SCD Code Status: Partial Family Communication:  Disposition Plan: Status is: Inpatient  Remains inpatient appropriate because:IV treatments appropriate due to intensity of illness or inability to take PO and Inpatient level of care appropriate due to severity of illness   Dispo:  Patient From: Home  Planned Disposition: Skilled Nursing Facility  Expected discharge date: 08/06/2020  Medically stable for discharge: Yes  Objective: Blood pressure (!) 142/79, pulse 77, temperature 98.2 F (36.8 C), resp. rate 18, height 5\' 2"  (1.575 m), weight 85.5 kg, SpO2 100 %.  Examination: General appearance: alert, cooperative and no distress Head: Normocephalic, without obvious abnormality, atraumatic Eyes: EOMI Lungs: clear to auscultation bilaterally Heart: regular rate and rhythm and S1, S2 normal Abdomen: normal findings: bowel sounds normal and soft, non-tender Extremities: no edema Skin: mobility and turgor normal Neurologic: LUE 1/5, 5/5 all others  Consultants:   Neurosurgery  PCM  Procedures:     Data Reviewed: I have personally reviewed following labs and imaging studies Results for orders placed or performed during the hospital encounter of 07/21/20 (from the past 24 hour(s))  Magnesium     Status:  None   Collection Time: 08/04/20  5:00 AM  Result Value Ref Range   Magnesium 1.8 1.7 - 2.4 mg/dL  CBC with Differential/Platelet     Status: Abnormal   Collection Time: 08/04/20  5:00 AM  Result Value Ref Range   WBC 16.7 (H) 4.0 - 10.5 K/uL   RBC 3.04 (L) 3.87 - 5.11 MIL/uL   Hemoglobin 8.6 (L) 12.0 - 15.0 g/dL   HCT 08/06/20 (L) 64.1 - 58.3 %   MCV 93.4 80.0 - 100.0 fL   MCH 28.3 26.0 - 34.0 pg   MCHC 30.3 30.0 - 36.0 g/dL   RDW 09.4 (H) 07.6 - 80.8 %   Platelets 250 150 - 400 K/uL   nRBC 0.1 0.0 - 0.2 %   Neutrophils Relative % 85 %   Neutro Abs 14.3 (H) 1.7 - 7.7 K/uL   Lymphocytes Relative 7 %   Lymphs Abs 1.2 0.7 - 4.0 K/uL   Monocytes Relative 6 %   Monocytes Absolute 1.0 0.1 - 1.0 K/uL   Eosinophils Relative 1 %   Eosinophils Absolute 0.1 0.0 - 0.5 K/uL   Basophils Relative 0 %   Basophils Absolute 0.0 0.0 - 0.1 K/uL   Immature Granulocytes 1 %   Abs Immature Granulocytes 0.10 (H) 0.00 - 0.07 K/uL  Basic metabolic panel     Status: Abnormal   Collection Time: 08/04/20  5:00 AM  Result Value Ref Range   Sodium 136 135 - 145 mmol/L   Potassium 4.0 3.5 - 5.1 mmol/L   Chloride 101 98 - 111 mmol/L   CO2 27 22 - 32 mmol/L   Glucose, Bld 80 70 - 99  mg/dL   BUN 7 (L) 8 - 23 mg/dL   Creatinine, Ser 2.83 (L) 0.44 - 1.00 mg/dL   Calcium 8.8 (L) 8.9 - 10.3 mg/dL   GFR, Estimated >15 >17 mL/min   Anion gap 8 5 - 15    No results found for this or any previous visit (from the past 240 hour(s)).   Radiology Studies: DG Knee 1-2 Views Right  Result Date: 08/03/2020 CLINICAL DATA:  Right knee pain EXAM: RIGHT KNEE - 1-2 VIEW COMPARISON:  None. FINDINGS: Mild degenerative changes, most prominent in the medial and patellofemoral compartments. No fracture or dislocation is seen. Visualized soft tissues are within normal limits. Suspected small suprapatellar knee joint effusion. IMPRESSION: Mild degenerative changes with small suprapatellar knee joint effusion. Electronically Signed    By: Charline Bills M.D.   On: 08/03/2020 10:26   DG Knee 1-2 Views Right  Final Result    VAS [place] LOWER EXTREMITY VENOUS (DVT)  Final Result    VAS [place] UPPER EXTREMITY VENOUS DUPLEX  Final Result    [place] EKG SITE RITE  Final Result    VAS [place] LOWER EXTREMITY VENOUS (DVT)  Final Result    CT ABDOMEN PELVIS W CONTRAST  Final Result    CT L-SPINE NO CHARGE  Final Result      Scheduled Meds: . (feeding supplement) PROSource Plus  30 mL Oral BID BM  . busPIRone  15 mg Oral BID  . Chlorhexidine Gluconate Cloth  6 each Topical Daily  . donepezil  10 mg Oral QHS  . dronabinol  2.5 mg Oral BID AC  . feeding supplement  237 mL Oral QID  . ferrous sulfate  325 mg Oral BID WC  . linaclotide  145 mcg Oral Daily  . loratadine  10 mg Oral Daily  . memantine  28 mg Oral QHS  . multivitamin with minerals  1 tablet Oral Daily  . nutrition supplement (JUVEN)  1 packet Oral BID BM  . polyethylene glycol  17 g Oral Daily  . potassium chloride  20 mEq Oral BID  . senna-docusate  1 tablet Oral BID  . sodium chloride flush  10-40 mL Intracatheter Q12H   PRN Meds: acetaminophen **OR** acetaminophen, bisacodyl, bisacodyl, cycloSPORINE, menthol-cetylpyridinium **OR** phenol, morphine injection, oxyCODONE, prochlorperazine, sodium chloride flush, sodium phosphate Continuous Infusions: .  ceFAZolin (ANCEF) IV 2 g (08/04/20 1114)  . lactated ringers 10 mL/hr at 08/02/20 1905     LOS: 14 days  Time spent: Greater than 50% of the 35 minute visit was spent in counseling/coordination of care for the patient as laid out in the A&P.   Lewie Chamber, MD [facility] 08/04/2020, 1:45 PM

## 2020-08-05 LAB — SARS CORONAVIRUS 2 BY RT PCR (HOSPITAL ORDER, PERFORMED IN ~~LOC~~ HOSPITAL LAB): SARS Coronavirus 2: NEGATIVE

## 2020-08-05 NOTE — Progress Notes (Signed)
[employer] engineer, contracting [facility]) Community Based Palliative Care  This patient is enrolled in our palliative care services in the community.  [facility] will continue to follow for any discharge planning needs and to coordinate continuation of palliative care.  Please do not hesitate to call with questions.     Thank you,    Elsie Saas, RN, [facility]       [facility] Liaison (listed on [facility] under [facility]osp[facility])     [phone]

## 2020-08-05 NOTE — Progress Notes (Signed)
Pt able to answer all orientation questions correctly and reported 8/10 generalized pain. This nurse pulled all morning medication as well as PRN pain med and attempted to administer. Pt was sleeping by the time this nurse got back in the room with medication however was able to rouse pt enough to administer small pills. Pt would not rouse further to be able to take larger pills. Pt said "stop that" when this nurse attempted to rouse enough to take the large pills and liquids.

## 2020-08-05 NOTE — Progress Notes (Signed)
PROGRESS NOTE    [name]   LOV:[id number]  DOB: 07-Jun-1941  DOA: 07/21/2020     15  PCP: [name], MD  CC: wound on back  Hospital Course: Ms. 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  She presented with sepsis and after work-up was found to have an unstageable ulcer of the distal thoracic/lumbar spine.  She underwent removal of metal hardware in her back with neurosurgery on 07/27/2020. Infectious disease was also consulted and ultimately she is recommended to continue on 6 weeks of antibiotics which were deescalated down to Ancef. She was started on a heparin drip after surgery to monitor hemoglobin.  Unfortunately, hemoglobin continued to downtrend and she required blood transfusion on 08/01/2020.  Due to this, she was considered a poor candidate to continue on anticoagulation.  She has been on anticoagulation outpatient for approximately 3 months with Eliquis.  She also has an IVC in place from September 2021.   Interval History:  No events overnight.  More lethargic and somnolent this morning.  Arouses some but does not want to talk much this morning.  Does complain of pain when her right knee is palpated but otherwise was rather disengaged.  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 intake and probable component of hemodilution and acute blood loss possibly from left upper extremity hematoma -Hemoglobin down to 6.9 g/dL on 88/41/6606, 1 unit PRBC being transfused -Discontinued heparin drip on 12/21.  No further anticoagulation -Hemoglobin remains stable especially off of anticoagulation.  At this time she is stable for discharging to SNF when bed available  History of DVT (deep vein thrombosis) - 05/06/20 and 07/22/20: Right CFV, femoral vein, prox profunda vein, popliteal vein, posterior tibial vein, peroneal vein.  Left posterior tibial vein, peroneal vein.  -Of note, also developed a left upper extremity hematoma -Previously on Eliquis outpatient, has been on heparin drip during hospitalization.  She has now had drop in hemoglobin requiring blood transfusion on 08/01/2020.  No longer considered a good candidate for ongoing long-term anticoagulation - IVC placed in Sept 2021  History of pulmonary embolus (PE) -05/05/2020: Large bilateral acute PE including saddle embolus and large clot burden.  Echo suggests right heart strain as well - s/p Eliquis outpatient and heparin inpatient - Hgb continues to downtrend - d/c anticoagulation at this time; poor candidate for further use  Dementia (HCC) Alert to self and place Continue BuSpar, Aricept, Namenda, delirium precautions -Palliative care has followed patient during hospitalization and she was also enrolled with palliative care outpatient prior to hospitalization.  Family is wishing for aggressive measures despite patient wishes for pursuing more of a  comfort care route.  Difficult in setting of underlying dementia  diagnosis.  Palliative care will continue to follow along at discharge.  Right knee pain -Likely underlying osteoarthritis -X-ray obtained on 08/03/2020 shows degenerative changes and small suprapatellar knee joint effusion which is where her pain is as well -Monitor for any signs of fever or patient becoming septic.  If develops, will need to further investigate the effusion  Pressure injury, unstageable (HCC) - continue dressing changes  Osteomyelitis (HCC) - see sepsis  Hypocalcemia -Replete and recheck as needed  Benign essential HTN -Blood pressure stable -On amlodipine, Lasix, lisinopril at home; on hold for now  Antimicrobials: Ancef  DVT prophylaxis: SCD Code Status: Partial Family Communication:  Disposition Plan: Status is: Inpatient  Remains inpatient appropriate because:IV treatments appropriate due to intensity of illness or inability to take PO and Inpatient level of care appropriate due to severity of illness   Dispo:  Patient From: Home  Planned Disposition: Skilled Nursing Facility  Expected discharge date: 08/06/2020  Medically stable for discharge: Yes  Objective: Blood pressure (!) 140/59, pulse 70, temperature 98.2 F (36.8 C), temperature source Oral, resp. rate 17, height 5\' 2"  (1.575 m), weight 72.9 kg, SpO2 100 %.  Examination: General appearance: cooperative, fatigued and no distress Head: Normocephalic, without obvious abnormality, atraumatic Eyes: EOMI Lungs: clear to auscultation bilaterally Heart: regular rate and rhythm and S1, S2 normal Abdomen: normal findings: bowel sounds normal and soft, non-tender Extremities: no edema Skin: mobility and turgor normal Neurologic: LUE 1/5, 5/5 all others  Consultants:   Neurosurgery  PCM  Procedures:     Data Reviewed: I have personally reviewed following labs and imaging studies Results for orders placed or performed during the hospital encounter of 07/21/20 (from the past 24 hour(s))   SARS Coronavirus 2 by RT PCR (hospital order, performed in [facility] hospital lab) Nasopharyngeal Nasopharyngeal Swab     Status: None   Collection Time: 08/05/20  5:00 AM   Specimen: Nasopharyngeal Swab  Result Value Ref Range   SARS Coronavirus 2 NEGATIVE NEGATIVE    Recent Results (from the past 240 hour(s))  SARS Coronavirus 2 by RT PCR (hospital order, performed in [facility] hospital lab) Nasopharyngeal Nasopharyngeal Swab     Status: None   Collection Time: 08/05/20  5:00 AM   Specimen: Nasopharyngeal Swab  Result Value Ref Range Status   SARS Coronavirus 2 NEGATIVE NEGATIVE Final    Comment: (NOTE) SARS-CoV-2 target nucleic acids are NOT DETECTED.  The SARS-CoV-2 RNA is generally detectable in upper and lower respiratory specimens during the acute phase of infection. The lowest concentration of SARS-CoV-2 viral copies this assay can detect is 250 copies / mL. A negative result does not preclude SARS-CoV-2 infection and should not be used as the sole basis for treatment or other patient management decisions.  A negative result may occur with improper specimen collection / handling, submission of specimen other than nasopharyngeal swab, presence of viral mutation(s) within the areas targeted by this assay, and inadequate number of viral copies (<250 copies / mL). A negative result must be combined with clinical observations, patient history, and epidemiological information.  Fact Sheet for Patients:   08/07/20  Fact Sheet for Healthcare Providers: BoilerBrush.com.cy  This test is not yet approved or  cleared by the https://pope.com/ FDA and has been authorized for detection and/or diagnosis of SARS-CoV-2 by FDA under an Emergency Use Authorization (EUA).  This EUA will remain in effect (meaning this test can be  used) for the duration of the COVID-19 declaration under Section 564(b)(1) of the Act, 21 U.S.C. section  360bbb-3(b)(1), unless the authorization is terminated or revoked sooner.  Performed at [facility], [address] [place], [place] [address]      Radiology Studies: No results found. DG Knee 1-2 Views Right  Final Result    VAS [place] LOWER EXTREMITY VENOUS (DVT)  Final Result    VAS [place] UPPER EXTREMITY VENOUS DUPLEX  Final Result    [place] EKG SITE RITE  Final Result    VAS [place] LOWER EXTREMITY VENOUS (DVT)  Final Result    CT ABDOMEN PELVIS W CONTRAST  Final Result    CT L-SPINE NO CHARGE  Final Result      Scheduled Meds: . (feeding supplement) PROSource Plus  30 mL Oral BID BM  . busPIRone  15 mg Oral BID  . Chlorhexidine Gluconate Cloth  6 each Topical Daily  . donepezil  10 mg Oral QHS  . dronabinol  2.5 mg Oral BID AC  . feeding supplement  237 mL Oral QID  . ferrous sulfate  325 mg Oral BID WC  . linaclotide  145 mcg Oral Daily  . loratadine  10 mg Oral Daily  . memantine  28 mg Oral QHS  . multivitamin with minerals  1 tablet Oral Daily  . nutrition supplement (JUVEN)  1 packet Oral BID BM  . polyethylene glycol  17 g Oral Daily  . potassium chloride  20 mEq Oral BID  . senna-docusate  1 tablet Oral BID  . sodium chloride flush  10-40 mL Intracatheter Q12H   PRN Meds: acetaminophen **OR** acetaminophen, bisacodyl, bisacodyl, cycloSPORINE, menthol-cetylpyridinium **OR** phenol, morphine injection, oxyCODONE, prochlorperazine, sodium chloride flush, sodium phosphate Continuous Infusions: .  ceFAZolin (ANCEF) IV 2 g (08/05/20 1055)  . lactated ringers 10 mL/hr at 08/02/20 1905     LOS: 15 days  Time spent: Greater than 50% of the 35 minute visit was spent in counseling/coordination of care for the patient as laid out in the A&P.   Lewie Chamber, MD [facility] 08/05/2020, 11:55 AM

## 2020-08-06 MED ORDER — FERROUS SULFATE 325 (65 FE) MG PO TABS: 325.0000 mg | ORAL_TABLET | Freq: Every day | ORAL | 3 refills | Status: AC

## 2020-08-06 MED ORDER — ACETAMINOPHEN 325 MG PO TABS: 650.0000 mg | ORAL_TABLET | ORAL | Status: DC | PRN

## 2020-08-06 MED ORDER — SENNOSIDES-DOCUSATE SODIUM 8.6-50 MG PO TABS: 1.0000 | ORAL_TABLET | Freq: Two times a day (BID) | ORAL | Status: DC

## 2020-08-06 MED ORDER — CEFAZOLIN IV (FOR PTA / DISCHARGE USE ONLY): 2.0000 g | Freq: Three times a day (TID) | INTRAVENOUS | 0 refills | Status: AC

## 2020-08-06 MED ORDER — BUSPIRONE HCL 15 MG PO TABS: 15.0000 mg | ORAL_TABLET | Freq: Two times a day (BID) | ORAL | Status: DC

## 2020-08-06 NOTE — Progress Notes (Signed)
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  She presented with sepsis and after work-up was found to have an unstageable ulcer of the distal thoracic/lumbar spine.  She underwent removal of metal hardware in her back with neurosurgery on 07/27/2020. Infectious disease was also consulted and ultimately she is recommended to continue on 6 weeks of antibiotics which were deescalated down to Ancef. She was started on a heparin drip after surgery to monitor hemoglobin.  Unfortunately, hemoglobin continued to downtrend and she required blood transfusion on 08/01/2020.  Due to this, she was considered a poor candidate to continue on anticoagulation.  She has been on anticoagulation outpatient for approximately 3 months with Eliquis.  She also has an IVC in place from September 2021.  See below for further plan per problem.   Interval History:  No events overnight.  Patient was to be discharged today however delayed despite facility knowing about discharge anticipated for today. DON unavail to inspect patient's back on arrival.   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 intake and probable component of hemodilution and acute blood loss possibly from left upper extremity hematoma -Hemoglobin down to 6.9 g/dL on 43/32/9518, 1 unit PRBC being transfused -Discontinued heparin drip on 12/21.  No further anticoagulation -Hemoglobin remains stable especially off of anticoagulation.  At this time she is stable for discharging to SNF when bed available  History of DVT (deep vein thrombosis) - 05/06/20 and 07/22/20: Right CFV, femoral vein, prox profunda vein, popliteal vein, posterior tibial vein, peroneal vein.  Left posterior tibial vein, peroneal vein.  -Of note, also developed a left upper extremity hematoma -Previously on Eliquis outpatient, has been on heparin drip during hospitalization.  She has now had drop in hemoglobin requiring blood transfusion on 08/01/2020.  No longer considered a good candidate for ongoing long-term anticoagulation - IVC placed in Sept 2021  History of pulmonary embolus (PE) -05/05/2020: Large bilateral acute PE including saddle embolus and large clot burden.  Echo suggests right heart strain as well - s/p Eliquis outpatient and heparin inpatient; failed trial due to Hgb downtrend requiring repeat transfusion - d/c anticoagulation at this time; poor candidate for further use  Dementia (HCC) Alert to self and place Continue BuSpar, Aricept, Namenda, delirium precautions -Palliative care has followed patient during hospitalization and she was also enrolled with palliative care outpatient prior to hospitalization.  Family is wishing for aggressive measures despite patient  wishes for pursuing more of a comfort care route.   Difficult in setting of underlying dementia diagnosis.  Palliative care will continue to follow along at discharge. Patient has voiced wishes for comfort care/hospice however family remains aggressive; need ongoing discussions for GOC as patient does appear to be hospice appropriate  Right knee pain -Likely underlying osteoarthritis -X-ray obtained on 08/03/2020 shows degenerative changes and small suprapatellar knee joint effusion which is where her pain is as well -Monitor for any signs of fever or patient becoming septic.  If develops, will need to further investigate the effusion  Pressure injury, unstageable (HCC) - continue dressing changes  Osteomyelitis (HCC) - see sepsis  Hypocalcemia -Replete and recheck as needed  Benign essential HTN -Blood pressure stable -On amlodipine, Lasix, lisinopril at home; on hold for now  Antimicrobials: Ancef  DVT prophylaxis: SCD Code Status: Partial Family Communication:  Disposition Plan: Status is: Inpatient  Remains inpatient appropriate because:IV treatments appropriate due to intensity of illness or inability to take PO and Inpatient level of care appropriate due to severity of illness   Dispo:  Patient From: Home  Planned Disposition: Skilled Nursing Facility  Expected discharge date: 08/06/2020, now delayed due to facilty  Medically stable for discharge: Yes  Objective: Blood pressure (!) 165/81, pulse 86, temperature 98.9 F (37.2 C), temperature source Oral, resp. rate 17, height 5\' 2"  (1.575 m), weight 72.9 kg, SpO2 97 %.  Examination: General appearance: cooperative, fatigued and no distress Head: Normocephalic, without obvious abnormality, atraumatic Eyes: EOMI Lungs: clear to auscultation bilaterally Heart: regular rate and rhythm and S1, S2 normal Abdomen: normal findings: bowel sounds normal and soft, non-tender Extremities: no edema Skin: mobility and turgor normal Neurologic: LUE 1/5, 5/5 all  others  Consultants:   Neurosurgery  PCM  Procedures:     Data Reviewed: I have personally reviewed following labs and imaging studies No results found for this or any previous visit (from the past 24 hour(s)).  Recent Results (from the past 240 hour(s))  SARS Coronavirus 2 by RT PCR (hospital order, performed in [facility] hospital lab) Nasopharyngeal Nasopharyngeal Swab     Status: None   Collection Time: 08/05/20  5:00 AM   Specimen: Nasopharyngeal Swab  Result Value Ref Range Status   SARS Coronavirus 2 NEGATIVE NEGATIVE Final    Comment: (NOTE) SARS-CoV-2 target nucleic acids are NOT DETECTED.  The SARS-CoV-2 RNA is generally detectable in upper and lower respiratory specimens during the acute phase of infection. The lowest concentration of SARS-CoV-2 viral copies this assay can detect is 250 copies / mL. A negative result does not preclude SARS-CoV-2 infection and should not be used as the sole basis for treatment or other patient management decisions.  A negative result may occur with improper specimen collection / handling, submission of specimen other than nasopharyngeal swab, presence of viral mutation(s) within the areas targeted by this assay, and inadequate number of viral copies (<250 copies / mL). A negative result must be combined with clinical observations, patient history, and epidemiological information.  Fact Sheet for Patients:   08/07/20  Fact Sheet for Healthcare Providers: BoilerBrush.com.cy  This test is not yet approved or  cleared by the https://pope.com/ FDA and has been authorized for detection and/or diagnosis of SARS-CoV-2 by FDA under an Emergency Use Authorization (EUA).  This EUA will remain in effect (meaning this test can be used) for the duration of the COVID-19 declaration under Section 564(b)(1) of the Act, 21 U.S.C. section 360bbb-3(b)(1), unless  the authorization is terminated  or revoked sooner.  Performed at [facility], [address] [place], [place] [address]      Radiology Studies: No results found. DG Knee 1-2 Views Right  Final Result    VAS [place] LOWER EXTREMITY VENOUS (DVT)  Final Result    VAS [place] UPPER EXTREMITY VENOUS DUPLEX  Final Result    [place] EKG SITE RITE  Final Result    VAS [place] LOWER EXTREMITY VENOUS (DVT)  Final Result    CT ABDOMEN PELVIS W CONTRAST  Final Result    CT L-SPINE NO CHARGE  Final Result      Scheduled Meds: . (feeding supplement) PROSource Plus  30 mL Oral BID BM  . busPIRone  15 mg Oral BID  . Chlorhexidine Gluconate Cloth  6 each Topical Daily  . donepezil  10 mg Oral QHS  . dronabinol  2.5 mg Oral BID AC  . feeding supplement  237 mL Oral QID  . ferrous sulfate  325 mg Oral BID WC  . linaclotide  145 mcg Oral Daily  . loratadine  10 mg Oral Daily  . memantine  28 mg Oral QHS  . multivitamin with minerals  1 tablet Oral Daily  . nutrition supplement (JUVEN)  1 packet Oral BID BM  . polyethylene glycol  17 g Oral Daily  . potassium chloride  20 mEq Oral BID  . senna-docusate  1 tablet Oral BID  . sodium chloride flush  10-40 mL Intracatheter Q12H   PRN Meds: acetaminophen **OR** acetaminophen, bisacodyl, bisacodyl, cycloSPORINE, menthol-cetylpyridinium **OR** phenol, morphine injection, oxyCODONE, prochlorperazine, sodium chloride flush, sodium phosphate Continuous Infusions: .  ceFAZolin (ANCEF) IV 2 g (08/06/20 0243)  . lactated ringers 10 mL/hr at 08/02/20 1905     LOS: 16 days  Time spent: Greater than 50% of the 35 minute visit was spent in counseling/coordination of care for the patient as laid out in the A&P.   Lewie Chamber, MD [facility] 08/06/2020, 12:05 PM

## 2020-08-06 NOTE — Discharge Summary (Addendum)
Physician Discharge Summary   [name]:[id number] DOB: March 01, 1941 DOA: 07/21/2020  PCP: Nolene Ebbs, MD  Admit date: 07/21/2020 Discharge date: 08/07/2020  Admitted From: home Disposition:  SNF Discharging physician: Dwyane Dee, MD  Recommendations for Outpatient Follow-up:  1. Follow-up with neurosurgery in 3 weeks 2. Continue Ancef until 09/08/2020 per ID recommendations; check labs per recommendations 3. Continue wound care to back and leave staples in place until follow-up 4. Continue palliative care outpatient.  Patient seems hospice appropriate and she was suggesting this inpatient however family wishes for aggressive measures.  Patient's history of dementia confounds picture some    Patient discharged to SNF in Discharge Condition: stable CODE STATUS: CPR, No intubation Diet recommendation:  Diet Orders (From admission, onward)    Start     Ordered   07/28/20 1258  DIET - DYS 1 Room service appropriate? Yes; Fluid consistency: Thin  Diet effective now       Question Answer Comment  Room service appropriate? 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  She presented with sepsis and after work-up was found to have an unstageable ulcer of the distal thoracic/lumbar spine.  She underwent removal of metal hardware in her back with neurosurgery on 07/27/2020. Infectious disease was also consulted and ultimately she is recommended to continue on 6 weeks of antibiotics which were deescalated down to Ancef. She was started on a heparin drip after surgery to monitor hemoglobin.  Unfortunately, hemoglobin continued to  downtrend and she required blood transfusion on 08/01/2020.  Due to this, she was considered a poor candidate to continue on anticoagulation.  She has been on anticoagulation outpatient for approximately 3 months with Eliquis.  She also has an IVC in place from September 2021.  See below for further plan per problem.   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 intake and probable component of hemodilution and acute blood loss possibly from left upper extremity hematoma -Hemoglobin down to 6.9 g/dL on 08/01/2020, 1 unit PRBC being transfused -Discontinued heparin drip on 12/21.  No further anticoagulation -Hemoglobin remains stable especially off of anticoagulation.  At this time she is stable for discharging to SNF when bed available  History of DVT (deep vein thrombosis) - 05/06/20 and 07/22/20: Right CFV, femoral vein, prox profunda vein, popliteal vein, posterior tibial vein, peroneal vein.  Left posterior tibial vein, peroneal vein.  -Of note, also developed a left upper extremity hematoma -Previously on Eliquis outpatient, has been on heparin drip during hospitalization.  She has now had drop in hemoglobin requiring blood transfusion on 08/01/2020.  No longer considered a good candidate for ongoing long-term anticoagulation - IVC placed in Sept 2021  History of  pulmonary embolus (PE) -05/05/2020: Large bilateral acute PE including saddle embolus and large clot burden.  Echo suggests right heart strain as well - s/p  Eliquis outpatient and heparin inpatient; failed trial due to Hgb downtrend requiring repeat transfusion - d/c anticoagulation at this time; poor candidate for further use  Dementia ([place]) Alert to self and place Continue BuSpar, Aricept, Namenda, delirium precautions -Palliative care has followed patient during hospitalization and she was also enrolled with palliative care outpatient prior to hospitalization.  Family is wishing for aggressive measures despite patient wishes for pursuing more of a comfort care route.  Difficult in setting of underlying dementia diagnosis.  Palliative care will continue to follow along at discharge. Patient has voiced wishes for comfort care/hospice however family remains aggressive; need ongoing discussions for [place] as patient does appear to be hospice appropriate  Right knee pain -Likely underlying osteoarthritis -X-ray obtained on 08/03/2020 shows degenerative changes and small suprapatellar knee joint effusion which is where her pain is as well -Monitor for any signs of fever or patient becoming septic.  If develops, will need to further investigate the effusion  Pressure injury, unstageable ([place]) - continue dressing changes  Osteomyelitis ([place]) - see sepsis  Hypocalcemia -Replete and recheck as needed  Benign essential HTN -Blood pressure stable -On amlodipine, Lasix, lisinopril at home; on hold for now   Principal Diagnosis: Severe sepsis [facility])  Discharge Diagnoses: Active Hospital Problems   Diagnosis Date Noted  . Anemia 08/01/2020    Priority: Medium  . History of DVT (deep vein thrombosis)     Priority: Medium  . History of pulmonary embolus (PE)     Priority: Medium  . Dementia ([place]) 02/21/2020    Priority: Low  . Right knee pain 08/03/2020  . Osteomyelitis ([place]) 07/23/2020  . Pressure injury, unstageable ([place])   . Hypocalcemia 07/21/2020  . Transaminitis   . Benign essential HTN   . Depression 07/29/2018  . Right leg swelling  09/28/2016    Resolved Hospital Problems   Diagnosis Date Noted Date Resolved  . Severe sepsis [facility]) 07/21/2020 08/02/2020    Priority: High    Discharge Instructions    [facility] pharmacist to adjust dose for Vancomycin, Aminoglycosides and other anti-infective therapies as requested by physician.   Complete by: As directed    [facility] to provide Cath Flo 87m   Complete by: As directed    Administer for PICC line occlusion and as ordered by physician for other access device issues.   Anaphylaxis Kit: Provided to treat any anaphylactic reaction to the medication being provided to the patient if First Dose or when requested by physician   Complete by: As directed    Epinephrine 110mml vial / amp: Administer 0.23m62m0.23ml39mubcutaneously once for moderate to severe anaphylaxis, nurse to call physician and pharmacy when reaction occurs and call 911 if needed for immediate care   Diphenhydramine 50mg23mIV vial: Administer 25-50mg 21mM PRN for first dose reaction, rash, itching, mild reaction, nurse to call physician and pharmacy when reaction occurs   Sodium Chloride 0.9% NS 500ml I39mdminister if needed for hypovolemic blood pressure drop or as ordered by physician after call to physician with anaphylactic reaction   Change dressing on IV access line weekly and PRN   Complete by: As directed    Discharge wound care:   Complete by: As directed    Change dressing on back daily.  Paint incision with Betadine dressing change.  Keep staples in  place until neurosurgery follow-up   Discharge wound care:   Complete by: As directed    Change dressing on back daily; paint incision with betadine at dressing change. Keep staples in place until neurosurgery follow up.   Flush IV access with Sodium Chloride 0.9% and Heparin 10 units/ml or 100 units/ml   Complete by: As directed    Home infusion instructions - [facility]   Complete by: As directed     Instructions: Flush IV access with Sodium Chloride 0.9% and Heparin 10units/ml or 100units/ml   Change dressing on IV access line: Weekly and PRN   Instructions Cath Flo 62m: Administer for PICC Line occlusion and as ordered by physician for other access device   [facility] pharmacist to adjust dose for: Vancomycin, Aminoglycosides and other anti-infective therapies as requested by physician   Incentive spirometry RT   Complete by: As directed    Increase activity slowly   Complete by: As directed    Increase activity slowly   Complete by: As directed    Method of administration may be changed at the discretion of home infusion pharmacist based upon assessment of the patient and/or caregiver's ability to self-administer the medication ordered   Complete by: As directed      Allergies as of 08/07/2020      Reactions   T-pa [alteplase] Swelling   Does okay with it if premedicated with benadryl.   Codeine Itching      Medication List    STOP taking these medications   amLODipine 10 MG tablet Commonly known as: NORVASC   apixaban 5 MG Tabs tablet Commonly known as: ELIQUIS   atorvastatin 20 MG tablet Commonly known as: LIPITOR   cholecalciferol 25 MCG (1000 UNIT) tablet Commonly known as: VITAMIN D3   dronabinol 2.5 MG capsule Commonly known as: MARINOL   fexofenadine 180 MG tablet Commonly known as: ALLEGRA   furosemide 40 MG tablet Commonly known as: LASIX   lisinopril 5 MG tablet Commonly known as: ZESTRIL   morphine 20 MG/ML concentrated solution Commonly known as: ROXANOL   oxyCODONE 15 MG immediate release tablet Commonly known as: ROXICODONE   zinc gluconate 50 MG tablet     TAKE these medications   acetaminophen 325 MG tablet Commonly known as: TYLENOL Take 2 tablets (650 mg total) by mouth every 4 (four) hours as needed for mild pain or moderate pain (or Fever >/= 101).   busPIRone 15 MG tablet Commonly known as: BUSPAR Take 1 tablet  (15 mg total) by mouth 2 (two) times daily. What changed:   medication strength  how much to take   ceFAZolin  IVPB Commonly known as: ANCEF Inject 2 g into the vein every 8 (eight) hours. Indication:  Sacral osteo/exposed hardware First Dose: Yes Last Day of Therapy:  09/08/20 Labs - Once weekly:  CBC/D and BMP, Labs - Every other week:  ESR and CRP Method of administration: IV Push Method of administration may be changed at the discretion of home infusion pharmacist based upon assessment of the patient and/or caregiver's ability to self-administer the medication ordered.   donepezil 10 MG tablet Commonly known as: ARICEPT Take 1 tablet (10 mg total) by mouth at bedtime.   feeding supplement Liqd Take 237 mLs by mouth 3 (three) times daily between meals.   ferrous sulfate 325 (65 FE) MG tablet Take 1 tablet (325 mg total) by mouth daily with breakfast.   Linzess 145 MCG Caps capsule Generic drug: linaclotide  Take 145 mcg by mouth daily.   memantine 28 MG Cp24 24 hr capsule Commonly known as: NAMENDA XR Take 1 capsule (28 mg total) by mouth daily. What changed: when to take this   MULTI-VITAMIN GUMMIES PO Take 1 each by mouth daily.   nitroGLYCERIN 0.4 MG SL tablet Commonly known as: NITROSTAT Place 1 tablet (0.4 mg total) under the tongue every 5 (five) minutes as needed for chest pain.   polyethylene glycol 17 g packet Commonly known as: MIRALAX / GLYCOLAX Take 17 g by mouth 2 (two) times daily. What changed:   when to take this  reasons to take this   Restasis 0.05 % ophthalmic emulsion Generic drug: cycloSPORINE Place 1 drop into both eyes daily as needed (dry eyes).   senna-docusate 8.6-50 MG tablet Commonly known as: Senokot-S Take 1 tablet by mouth 2 (two) times daily.            Discharge Care Instructions  (From admission, onward)         Start     Ordered   08/07/20 0000  Discharge wound care:       Comments: Change dressing on back  daily; paint incision with betadine at dressing change. Keep staples in place until neurosurgery follow up.   08/07/20 0951   08/06/20 0000  Change dressing on IV access line weekly and PRN  (Home infusion instructions - [facility] )        08/06/20 1129   08/06/20 0000  Discharge wound care:       Comments: Change dressing on back daily.  Paint incision with Betadine dressing change.  Keep staples in place until neurosurgery follow-up   08/06/20 1136          Follow-up Information    Kristeen Miss, MD. Schedule an appointment as soon as possible for a visit in 3 week(s).   Specialty: Neurosurgery Why: Leave staples in until follow up appointment Contact information: [address] [place] [address] [phone]              Allergies  Allergen Reactions  . T-Pa [Alteplase] Swelling    Does okay with it if premedicated with benadryl.  . Codeine Itching    Consultations: Neurosurgery  Discharge Exam: BP (!) 152/72 (BP Location: Right Arm)   Pulse 79   Temp 98.7 F (37.1 C) (Oral)   Resp 20   Ht _0  (1.575 m)   Wt 72.7 kg   SpO2 95%   BMI 29.31 kg/m  General appearance: cooperative, fatigued and no distress Head: Normocephalic, without obvious abnormality, atraumatic Eyes: EOMI Lungs: clear to auscultation bilaterally Heart: regular rate and rhythm and S1, S2 normal Abdomen: normal findings: bowel sounds normal and soft, non-tender Extremities: right knee pain ongoing with swelling stable (no erythema or calor) Skin: mobility and turgor normal Neurologic: LUE 1/5, 5/5 all others  The results of significant diagnostics from this hospitalization (including imaging, microbiology, ancillary and laboratory) are listed below for reference.   Microbiology: Recent Results (from the past 240 hour(s))  SARS Coronavirus 2 by RT PCR (hospital order, performed in [facility] hospital lab) Nasopharyngeal Nasopharyngeal Swab     Status:  None   Collection Time: 08/05/20  5:00 AM   Specimen: Nasopharyngeal Swab  Result Value Ref Range Status   SARS Coronavirus 2 NEGATIVE NEGATIVE Final    Comment: (NOTE) SARS-CoV-2 target nucleic acids are NOT DETECTED.  The SARS-CoV-2 RNA is generally detectable in  upper and lower respiratory specimens during the acute phase of infection. The lowest concentration of SARS-CoV-2 viral copies this assay can detect is 250 copies / mL. A negative result does not preclude SARS-CoV-2 infection and should not be used as the sole basis for treatment or other patient management decisions.  A negative result may occur with improper specimen collection / handling, submission of specimen other than nasopharyngeal swab, presence of viral mutation(s) within the areas targeted by this assay, and inadequate number of viral copies (<250 copies / mL). A negative result must be combined with clinical observations, patient history, and epidemiological information.  Fact Sheet for Patients:   StrictlyIdeas.no  Fact Sheet for Healthcare Providers: BankingDealers.co.za  This test is not yet approved or  cleared by the [place] FDA and has been authorized for detection and/or diagnosis of SARS-CoV-2 by FDA under an Emergency Use Authorization (EUA).  This EUA will remain in effect (meaning this test can be used) for the duration of the COVID-19 declaration under Section 564(b)(1) of the Act, 21 U.S.C. section 360bbb-3(b)(1), unless the authorization is terminated or revoked sooner.  Performed at [facility], [place] [address] [place], [place] [address]      Labs: BNP (last 3 results) Recent Labs    09/16/19 1410 05/06/20 0123  BNP 91.2 951.8*   Basic Metabolic Panel: Recent Labs  Lab 08/01/20 0147 08/02/20 0200 08/03/20 0309 08/04/20 0500  NA 142 140 140 136  K 3.4* 3.6 3.6 4.0  CL 107 105 104 101  CO2 _0 GLUCOSE 83 91  77 80  BUN 16 14 5* 7*  CREATININE 0.43* 0.39* 0.41* 0.39*  CALCIUM 8.5* 8.4* 8.6* 8.8*  MG 2.1 2.0 1.8 1.8   Liver Function Tests: Recent Labs  Lab 08/01/20 0147 08/02/20 0200  AST 30 22  ALT 12 8  ALKPHOS 53 57  BILITOT 0.5 0.7  PROT 5.2* 5.2*  ALBUMIN 1.6* 1.5*   No results for input(s): LIPASE, AMYLASE in the last 168 hours. No results for input(s): AMMONIA in the last 168 hours. CBC: Recent Labs  Lab 08/01/20 0147 08/01/20 1245 08/02/20 0200 08/03/20 0309 08/04/20 0500  WBC 12.1*  --  12.9* 12.6* 16.7*  NEUTROABS 8.7*  --  9.8* 9.7* 14.3*  HGB 6.9* 8.9* 8.3* 8.2* 8.6*  HCT 22.9* 28.9* 26.1* 27.3* 28.4*  MCV 97.0  --  92.2 93.8 93.4  PLT 246  --  253 256 250   Cardiac Enzymes: No results for input(s): CKTOTAL, CKMB, CKMBINDEX, TROPONINI in the last 168 hours. BNP: Invalid input(s): POCBNP CBG: No results for input(s): GLUCAP in the last 168 hours. D-Dimer No results for input(s): DDIMER in the last 72 hours. Hgb A1c No results for input(s): HGBA1C in the last 72 hours. Lipid Profile No results for input(s): CHOL, HDL, LDLCALC, TRIG, CHOLHDL, LDLDIRECT in the last 72 hours. Thyroid function studies No results for input(s): TSH, T4TOTAL, T3FREE, THYROIDAB in the last 72 hours.  Invalid input(s): FREET3 Anemia work up No results for input(s): VITAMINB12, FOLATE, FERRITIN, TIBC, IRON, RETICCTPCT in the last 72 hours. Urinalysis    Component Value Date/Time   COLORURINE YELLOW 07/21/2020 1809   APPEARANCEUR CLEAR 07/21/2020 1809   LABSPEC 1.015 07/21/2020 1809   PHURINE 6.0 07/21/2020 1809   GLUCOSEU NEGATIVE 07/21/2020 1809   HGBUR NEGATIVE 07/21/2020 1809   BILIRUBINUR NEGATIVE 07/21/2020 1809   KETONESUR NEGATIVE 07/21/2020 1809   PROTEINUR NEGATIVE 07/21/2020 1809   UROBILINOGEN 1.0  02/14/2012 1054   NITRITE NEGATIVE 07/21/2020 1809   LEUKOCYTESUR NEGATIVE 07/21/2020 1809   Sepsis Labs Invalid input(s): PROCALCITONIN,  WBC,   LACTICIDVEN Microbiology Recent Results (from the past 240 hour(s))  SARS Coronavirus 2 by RT PCR (hospital order, performed in [facility] hospital lab) Nasopharyngeal Nasopharyngeal Swab     Status: None   Collection Time: 08/05/20  5:00 AM   Specimen: Nasopharyngeal Swab  Result Value Ref Range Status   SARS Coronavirus 2 NEGATIVE NEGATIVE Final    Comment: (NOTE) SARS-CoV-2 target nucleic acids are NOT DETECTED.  The SARS-CoV-2 RNA is generally detectable in upper and lower respiratory specimens during the acute phase of infection. The lowest concentration of SARS-CoV-2 viral copies this assay can detect is 250 copies / mL. A negative result does not preclude SARS-CoV-2 infection and should not be used as the sole basis for treatment or other patient management decisions.  A negative result may occur with improper specimen collection / handling, submission of specimen other than nasopharyngeal swab, presence of viral mutation(s) within the areas targeted by this assay, and inadequate number of viral copies (<250 copies / mL). A negative result must be combined with clinical observations, patient history, and epidemiological information.  Fact Sheet for Patients:   StrictlyIdeas.no  Fact Sheet for Healthcare Providers: BankingDealers.co.za  This test is not yet approved or  cleared by the [place] FDA and has been authorized for detection and/or diagnosis of SARS-CoV-2 by FDA under an Emergency Use Authorization (EUA).  This EUA will remain in effect (meaning this test can be used) for the duration of the COVID-19 declaration under Section 564(b)(1) of the Act, 21 U.S.C. section 360bbb-3(b)(1), unless the authorization is terminated or revoked sooner.  Performed at [facility], [place] [address] [place], [place] [address]     Procedures/Studies: DG Knee 1-2 Views Right  Result Date: 08/03/2020 CLINICAL DATA:   Right knee pain EXAM: RIGHT KNEE - 1-2 VIEW COMPARISON:  None. FINDINGS: Mild degenerative changes, most prominent in the medial and patellofemoral compartments. No fracture or dislocation is seen. Visualized soft tissues are within normal limits. Suspected small suprapatellar knee joint effusion. IMPRESSION: Mild degenerative changes with small suprapatellar knee joint effusion. Electronically Signed   By: Julian Hy M.D.   On: 08/03/2020 10:26   CT ABDOMEN PELVIS W CONTRAST  Result Date: 07/21/2020 CLINICAL DATA:  Back pain. Prior lumbar surgery with overlying wound. Abdominal pain. Rule out abscess. EXAM: CT ABDOMEN AND PELVIS WITH CONTRAST TECHNIQUE: Multidetector CT imaging of the abdomen and pelvis was performed using the standard protocol following bolus administration of intravenous contrast. CONTRAST:  141m OMNIPAQUE IOHEXOL 300 MG/ML  SOLN COMPARISON:  05/09/2020 FINDINGS: Lower chest: Mild atelectasis in the lung bases. No pleural effusion. Coronary atherosclerosis. Hepatobiliary: A few subcentimeter hypodensities in the liver too small to fully characterize. Moderate gallbladder distension is similar to the prior study as is mild extrahepatic biliary dilatation. Pancreas: Borderline to mild pancreatic ductal dilatation. No peripancreatic inflammation. Spleen: Unremarkable. Adrenals/Urinary Tract: Unremarkable adrenal glands. No evidence of renal mass, calculi, or hydronephrosis. Under distended bladder with appearance of circumferential wall thickening. Stomach/Bowel: The stomach is unremarkable. There is a moderately large amount of stool in the colon and rectum, much greater than on the prior study. There is no evidence of bowel obstruction or inflammation. Vascular/Lymphatic: Abdominal aortic atherosclerosis without aneurysm. New infrarenal IVC filter. Heterogeneously diminished enhancement of the right common femoral vein with question of a central filling defect and partially  visualized  asymmetric right thigh soft tissue edema. Reproductive: Suspected small calcified uterine fibroid. No adnexal mass. Other: No intraperitoneal free fluid, fluid collection, or pneumoperitoneum. Musculoskeletal: Scoliosis and postoperative changes in the lumbar spine with overlying skin breakdown, evaluated in detail on the separate lumbar spine CT. Old right-sided pubic ramus fractures. IMPRESSION: 1. Possible deep venous thrombosis in the right common femoral vein with asymmetric right thigh soft tissue edema. There was DVT more distally in the right leg on a 05/06/2020 ultrasound. 2. Suspected circumferential bladder wall thickening despite underdistention. Correlate for cystitis. 3. No evidence of intra-abdominal abscess. Lumbar spine reported separately. 4. Moderately large colonic stool burden. No evidence of bowel obstruction. 5. Aortic Atherosclerosis (ICD10-I70.0). Electronically Signed   By: Logan Bores M.D.   On: 07/21/2020 17:37   CT L-SPINE NO CHARGE  Result Date: 07/21/2020 CLINICAL DATA:  Previous spinal surgery with poorly healing wound. Metal exposure 2 physical exam. EXAM: CT LUMBAR SPINE WITHOUT CONTRAST TECHNIQUE: Multidetector CT imaging of the lumbar spine was performed without intravenous contrast administration. Multiplanar CT image reconstructions were also generated. COMPARISON:  CT 05/09/2020 FINDINGS: Segmentation: 5 lumbar type vertebral bodies assumed. Alignment: Fixed scoliotic curvature convex to the left. Kyphotic curvature of the spine at T12-L1. Vertebrae: Previous fusion procedure from L1 to the sacrum with pedicle screws and posterior rods. Pedicle screws on the right at L1, L3, L4, L5 and S1. Pedicle screws on the left at L1, L2, L3, L5 and S1. There appears to be solid union throughout the region. No evidence of hardware fracture or discontinuity. Left-sided screw at L5 enters the disc space, but this does not appear to be associated with any unfavorable finding. Left-sided  screw at L2 enters the disc space but this does not appear to be associated with any unfavorable finding. Paraspinal and other soft tissues: Posterior soft tissue skin breakdown posterior to the region of fusion. Based on the clinical history of hardware being visible, I assume this is related to the right bracket at L1. The left bracket at L1 is just beneath the skin but not apparently associated with an overlying skin defect. No evidence of frank soft tissue abscess. Disc levels: As noted above, there appears to be solid union from L1 to the sacrum. Sufficient patency of the canal and foramina. IMPRESSION: Posterior soft tissue skin breakdown posterior to the region of fusion. Based on the clinical history of hardware being visible, I assume this is related to the right bracket at L1. The left bracket at L1 is just beneath the skin but not apparently associated with an overlying skin defect. No evidence of frank soft tissue abscess. Electronically Signed   By: Nelson Chimes M.D.   On: 07/21/2020 16:52   VAS [place] LOWER EXTREMITY VENOUS (DVT)  Result Date: 07/30/2020  Lower Venous DVT Study Indications: Edema, and Recent history of DVT.  Risk Factors: Has IVC filter. Anticoagulation: Eliquis. Limitations: Edema and poor ultrasound/tissue interface. Comparison Study: Prior RLE venous studies from 07/22/20 and 04/16/20 are                   available for comparison Performing Technologist: Sharion Dove RVS  Examination Guidelines: A complete evaluation includes B-mode imaging, spectral Doppler, color Doppler, and power Doppler as needed of all accessible portions of each vessel. Bilateral testing is considered an integral part of a complete examination. Limited examinations for reoccurring indications may be performed as noted. The reflux portion of the exam is performed with the patient in  reverse Trendelenburg.  +---------+---------------+---------+-----------+----------+-------------------+ RIGHT     CompressibilityPhasicitySpontaneityPropertiesThrombus Aging      +---------+---------------+---------+-----------+----------+-------------------+ CFV      Partial        No       Yes                  Age Indeterminate   +---------+---------------+---------+-----------+----------+-------------------+ SFJ      Full                                                             +---------+---------------+---------+-----------+----------+-------------------+ FV Prox  Partial        No       No                   Age Indeterminate   +---------+---------------+---------+-----------+----------+-------------------+ FV Mid   Partial        No       No                   Age Indeterminate   +---------+---------------+---------+-----------+----------+-------------------+ FV DistalPartial        No       Yes                  Age Indeterminate   +---------+---------------+---------+-----------+----------+-------------------+ PFV      Partial        No       No                   Age Indeterminate   +---------+---------------+---------+-----------+----------+-------------------+ POP                                                   Not well visualized +---------+---------------+---------+-----------+----------+-------------------+ PTV      None                                         Age Indeterminate   +---------+---------------+---------+-----------+----------+-------------------+ PERO     None                                         Age Indeterminate   +---------+---------------+---------+-----------+----------+-------------------+   +----+---------------+---------+-----------+----------+--------------+ LEFTCompressibilityPhasicitySpontaneityPropertiesThrombus Aging +----+---------------+---------+-----------+----------+--------------+ CFV Full           Yes      Yes                                  +----+---------------+---------+-----------+----------+--------------+     Summary: RIGHT: - 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 - Findings appear essentially unchanged compared to previous examination.  LEFT: - No evidence of common femoral vein obstruction.  *See table(s) above for measurements and observations. Electronically signed by Monica Martinez MD on 07/30/2020 at 3:48:19 PM.    Final    VAS [place] LOWER EXTREMITY  VENOUS (DVT)  Result Date: 07/24/2020  Lower Venous DVT Study Indications: History of DVT, worsening RT leg swelling, F/U CT abdomen imaging findings.  Anticoagulation: Xarelto, IVC filter placed. Limitations: Poor ultrasound/tissue interface. Comparison Study: 05-07-2020 Prior bilateral lower extremity venous study                   available for comparison. Performing Technologist: Darlin Coco RDMS  Examination Guidelines: A complete evaluation includes B-mode imaging, spectral Doppler, color Doppler, and power Doppler as needed of all accessible portions of each vessel. Bilateral testing is considered an integral part of a complete examination. Limited examinations for reoccurring indications may be performed as noted. The reflux portion of the exam is performed with the patient in reverse Trendelenburg.  +---------+---------------+---------+-----------+----------+-------------------+ RIGHT    CompressibilityPhasicitySpontaneityPropertiesThrombus Aging      +---------+---------------+---------+-----------+----------+-------------------+ CFV      Partial        Yes      Yes                  Age Indeterminate   +---------+---------------+---------+-----------+----------+-------------------+ SFJ      Full           Yes      Yes                                       +---------+---------------+---------+-----------+----------+-------------------+ FV Prox  None           No       No                   Age Indeterminate   +---------+---------------+---------+-----------+----------+-------------------+ FV Mid   Full           Yes      Yes                                      +---------+---------------+---------+-----------+----------+-------------------+ FV DistalFull           Yes      Yes                                      +---------+---------------+---------+-----------+----------+-------------------+ PFV      Partial        Yes      Yes                  Age Indeterminate   +---------+---------------+---------+-----------+----------+-------------------+ POP      Full           Yes      Yes                                      +---------+---------------+---------+-----------+----------+-------------------+ PTV                                                   Not well visualized +---------+---------------+---------+-----------+----------+-------------------+ PERO  Not well visualized +---------+---------------+---------+-----------+----------+-------------------+   +---------+---------------+---------+-----------+----------+--------------+ LEFT     CompressibilityPhasicitySpontaneityPropertiesThrombus Aging +---------+---------------+---------+-----------+----------+--------------+ CFV      Full           Yes      Yes                                 +---------+---------------+---------+-----------+----------+--------------+ SFJ      Full                                                        +---------+---------------+---------+-----------+----------+--------------+ FV Prox  Full                                                        +---------+---------------+---------+-----------+----------+--------------+ FV Mid   Full                                                         +---------+---------------+---------+-----------+----------+--------------+ FV DistalFull                                                        +---------+---------------+---------+-----------+----------+--------------+ PFV      Full                                                        +---------+---------------+---------+-----------+----------+--------------+ POP      Full           Yes      Yes                                 +---------+---------------+---------+-----------+----------+--------------+ PTV      Full                                                        +---------+---------------+---------+-----------+----------+--------------+ PERO     Full                                                        +---------+---------------+---------+-----------+----------+--------------+    Summary: RIGHT: - Findings consistent with 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 - A cystic structure  is found in the popliteal fossa.  LEFT: - There is no evidence of deep vein thrombosis in the lower extremity.  - No cystic structure found in the popliteal fossa.  *See table(s) above for measurements and observations. Electronically signed by Ruta Hinds MD on 07/24/2020 at 7:45:34 PM.    Final    VAS [place] UPPER EXTREMITY VENOUS DUPLEX  Result Date: 07/30/2020 UPPER VENOUS STUDY  Indications: Edema Risk Factors: Known hematoma in left upper extremity. Limitations: Patient unable to turn head to right. Comparison       Prior LUE venous duplex from 06/09/20, is available for Study:           comparison Performing Technologist: Sharion Dove RVS  Examination Guidelines: A complete evaluation includes B-mode imaging, spectral Doppler, color Doppler, and power Doppler as needed of all accessible portions of each  vessel. Bilateral testing is considered an integral part of a complete examination. Limited examinations for reoccurring indications may be performed as noted.  Right Findings: +----------+------------+---------+-----------+----------+-------+ RIGHT     CompressiblePhasicitySpontaneousPropertiesSummary +----------+------------+---------+-----------+----------+-------+ IJV                      Yes       Yes                      +----------+------------+---------+-----------+----------+-------+ Subclavian               Yes       Yes                      +----------+------------+---------+-----------+----------+-------+  Left Findings: +----------+------------+---------+-----------+----------+---------------+ LEFT      CompressiblePhasicitySpontaneousProperties    Summary     +----------+------------+---------+-----------+----------+---------------+ IJV                                                 Not visualized  +----------+------------+---------+-----------+----------+---------------+ Subclavian               Yes       Yes                              +----------+------------+---------+-----------+----------+---------------+ Axillary                 Yes       Yes                              +----------+------------+---------+-----------+----------+---------------+ Brachial      Full       Yes       Yes                              +----------+------------+---------+-----------+----------+---------------+ Radial                                              patent by color +----------+------------+---------+-----------+----------+---------------+ Ulnar  Not visualized  +----------+------------+---------+-----------+----------+---------------+ Cephalic      Full                                                  +----------+------------+---------+-----------+----------+---------------+ Basilic        Full                                                  +----------+------------+---------+-----------+----------+---------------+  Summary:  Right: No evidence of thrombosis in the subclavian.  Left: No evidence of deep vein thrombosis in the upper extremity. Hematoma found last exam, 05/20/20, is still present.  *See table(s) above for measurements and observations.  Diagnosing physician: Monica Martinez MD Electronically signed by Monica Martinez MD on 07/30/2020 at 3:47:09 PM.    Final    [place] EKG SITE RITE  Result Date: 07/28/2020 If Site Rite image not attached, placement could not be confirmed due to current cardiac rhythm.    Time coordinating discharge: Over 30 minutes    Dwyane Dee, MD  [facility] 08/07/2020, 9:51 AM

## 2020-08-06 NOTE — Progress Notes (Signed)
Patient was alert and able to take all scheduled medications. Patient tolerated taking POs well.

## 2020-08-06 NOTE — TOC Progression Note (Signed)
Transition of Care [facility]) - Progression Note    Patient Details  Name: [name] MRN: [id number] Date of Birth: 09-08-1940  Transition of [facility]) CM/SW Contact  Annalee Genta, LCSW Phone Number: 08/06/2020, 3:47 PM  Clinical Narrative: Patient was initially expected to discharge from [facility] hospital to [facility] on 08/06/2020. However, per liaison and [facility] admissions they need their Director of Nursing to evaluate patient's wounds on arrival and due to them being unavailable until 08/07/2020 patient's discharge has had to be delayed a day. DC summary completed 08/06/20 was sent to facility but CSW anticipates they will request an updated one on 08/07/2020.    Expected Discharge Plan: Skilled Nursing Facility Barriers to Discharge: SNF Pending bed offer  Expected Discharge Plan and Services Expected Discharge Plan: Skilled Nursing Facility       Living arrangements for the past 2 months: Single Family Home Expected Discharge Date: 08/06/20                                     Social Determinants of Health (SDOH) Interventions    Readmission Risk Interventions No flowsheet data found.

## 2020-08-07 LAB — VITAMIN B1: Vitamin B1 (Thiamine): 94.9 nmol/L (ref 66.5–200.0)

## 2020-08-07 NOTE — Progress Notes (Signed)
Report called to Marisue Ivan at [facility]. Patient picked up by [facility]. Vitals stable at discharge. No belongings present. Picc line patent,intact and Flushed.

## 2020-08-07 NOTE — TOC Transition Note (Signed)
Transition of Care [facility]) - CM/SW Discharge Note *Discharge to [facility] *Room 107 *Number to call report - [phone]   Patient Details  Name: [name] MRN: [id number] Date of Birth: 1941/03/10  Transition of Care [facility]) CM/SW Contact:  Cristobal Goldmann, LCSW Phone Number: 08/07/2020, 12:57 PM   Clinical Narrative:  Patient medically stable for discharge and going to [facility] for short-term rehab. Admissions director at [facility] contacted regarding patient's readiness for discharge and d/c clinicals transmitted to facility. Son, [name] [phone]) contacted and updated regarding today's discharge and patient's room number at [facility] provided to son. son.provided. Ms. Kyer will be transported by on-emergency ambulance transport.   Final next level of care: Skilled Nursing Facility [facility] - Room 107) Barriers to Discharge: Barriers Resolved   Patient Goals and CMS Choice Patient states their goals for this hospitalization and ongoing recovery are:: To return home after rehab CMS Medicare.gov Compare Post Acute Care list provided to:: Patient Represenative (must comment) Choice offered to / list presented to : Adult Children  Discharge Placement   Existing PASRR number confirmed : 07/30/20          Patient chooses bed at: [facility] Patient to be transferred to facility by: Non-emergency ambulance transport Name of family member notified: [name] - [phone] Patient and family notified of of transfer: 08/07/20  Discharge Plan and Services                                    Social Determinants of Health (SDOH) Interventions  No SDOH interventions requested or needed at discharge   Readmission Risk Interventions No flowsheet data found.

## 2020-08-08 ENCOUNTER — Encounter (HOSPITAL_BASED_OUTPATIENT_CLINIC_OR_DEPARTMENT_OTHER): Payer: Medicare Other | Admitting: Internal Medicine

## 2020-08-08 ENCOUNTER — Telehealth: Payer: Self-pay

## 2020-08-08 NOTE — Telephone Encounter (Signed)
Spoke with pt's son who report pt was recently admitted to a facility. Son is unsure of the process for virtual visit and will have [facility] call to reschedule virtual visit.

## 2020-08-11 ENCOUNTER — Non-Acute Institutional Stay: Payer: Medicare Other | Admitting: Hospice

## 2020-08-11 ENCOUNTER — Other Ambulatory Visit: Payer: Self-pay

## 2020-08-11 DIAGNOSIS — L89120 Pressure ulcer of left upper back, unstageable: Secondary | ICD-10-CM

## 2020-08-11 DIAGNOSIS — Z515 Encounter for palliative care: Secondary | ICD-10-CM

## 2020-08-11 NOTE — Progress Notes (Signed)
PATIENT NAME: [name] [id number] (home)  DOB: 08-07-1941 MRN: [id number]  PRIMARY CARE PROVIDER:    Nolene Ebbs, MD,  [place] [address] [phone]  REFERRING PROVIDER:   Nolene Ebbs, MD [address] [place],   [address] [phone]  RESPONSIBLE PARTY:   Extended Emergency Contact Information Primary Emergency Contact: [name] Phone: [phone] Relation: Son Secondary Emergency Contact: [name] Phone: [phone] Relation: Other  I met face to face with patient and family in home/facility.  ADVANCE CARE PLANNING/RECOMMENDATIONS/PLAN:    Visit at the request of Sarah Blattenberger, NP for palliative consult. Visit consisted of building trust and discussions on Palliative Medicine as specialized medical care for people living with serious illness, aimed at facilitating better quality of life through symptoms relief, assisting with advance care plan and establishing complex decision making.  NP called and spoke with [name] who endorsed palliative service  Discussion on the difference between Palliative and Hospice care. Palliative care and hospice have similar goals of managing symptoms, promoting comfort, improving quality of life, and maintaining a person's dignity. However, palliative care may be offered during any phase of a serious illness, while hospice care is usually offered when a person is expected to live for 6 months or less.  Visit consisted of counseling and education dealing with the complex and emotionally intense issues of symptom management and palliative care in the setting of serious and potentially life-threatening illness. Palliative care team will continue to support patient, patient's family, and medical team.  Advance Care Planning: Our advance care planning  conversation included a discussion about:    The value and importance of advance care planning  Exploration of goals of care in the event of a sudden injury or illness  Identification and preparation of a healthcare agent          Review and updating or creation of an advance directive document         Discussion on decision not to resuscitate or to de-escalate disease focused treatments due to poor prognosis.   CODE STATUS: CODE STATUS reviewed.  Documents in epic indicates patient is a DO NOT RESUSCITATE.  MOST selections in Epic include limited additional intervention antibiotics if indicated, IV fluids for defined trial.,  Feeding tube long-term if indicated.  While in the hospital in last visit, it changed to CPR, no intubation.  [name] clarified that patient is a partial code-chest compressions with no intubation.  NP removed the ACP documents in epic since it is no longer accurate and reflective of patient's CODE STATUS. GOALS OF CARE: Goals of care include to maximize quality of life and symptom management.  Follow up Palliative Care Visit: Palliative care will continue to follow for goals of care clarification and symptom management.   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  Patient in SNF for rehab; to continue with cefazolin IV until 09/08/2020.  Patient with no fever/chills, she denies pain/discomfort.  Facility wound nurse following wound.  Nursing with no complaints.  Encouraged ongoing care. Palliative will continue to monitor for symptom management/decline and make recommendations as needed.   Family /Caregiver/Community Supports: Patient in SNF for ongoing care  I spent 1 hour 25 minutes providing this initial consultation; time includes time spent with patient/family, chart  review, provider coordination,  and documentation. More  than 50% of the time in this consultation was spent on counseling patient and coordinating communication.   CHIEF COMPLAIN/HISTORY OF PRESENT ILLNESS:  [name] is a 79 y.o. 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  She continues on cefazolin till 09/08/2020. Patient with history of DVT, Dementia.  History obtained from review of EMR, primary RN and with patient/son.   Palliative Care was asked to follow this patient by consultation request of Sarah Blattenberger, NP to help address advance care planning and complex decision making. Thank you for the opportunity to participate in the care of [name]: Partial code  PPS: 30%  HOSPICE ELIGIBILITY/DIAGNOSIS: TBD  PAST MEDICAL HISTORY:  Past Medical History:  Diagnosis Date  . Arthritis   . Asthma   . Chronic back pain   . Depression   . History of pulmonary embolus (PE)   . Hx-TIA (transient ischemic attack)   . Hyperlipemia   . Hypertension   . Personal history of DVT (deep vein thrombosis)   . Scoliosis   . Sleep apnea    cpap - settingsi at 2   . Stroke ([place])   . 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 Smoking status: Former Smoker    Packs/day: 1.00    Years: 10.00    Pack years: 10.00    Types: Cigarettes    Quit date: 08/12/1973    Years since quitting: 47.0  . Smokeless tobacco: Never Used  Substance Use Topics  . Alcohol use: No   FAMILY HX:  Family History  Problem Relation Age of Onset  . Other Father        kidney problems     Labs:   No results for input(s): WBC, HGB, HCT, PLT, MCV in the last 168 hours. No results for input(s): NA, K, CL, CO2, BUN, CREATININE, GLUCOSE in the last 168 hours.  ALLERGIES:  Allergies  Allergen Reactions  . T-Pa [Alteplase] Swelling    Does okay with it if  premedicated with benadryl.  . Codeine Itching     PERTINENT MEDICATIONS:  Outpatient Encounter Medications as of 08/11/2020  Medication Sig  . acetaminophen (TYLENOL) 325 MG tablet Take 2 tablets (650 mg total) by mouth every 4 (four) hours as needed for mild pain or moderate pain (or Fever >/= 101).  . busPIRone (BUSPAR) 15 MG tablet Take 1 tablet (15 mg total) by mouth 2 (two) times daily.  Marland Kitchen ceFAZolin (ANCEF) IVPB Inject 2 g into the vein every 8 (eight) hours. Indication:  Sacral osteo/exposed hardware First Dose: Yes Last Day of Therapy:  09/08/20 Labs - Once weekly:  CBC/D and BMP, Labs - Every other week:  ESR and CRP Method of administration: IV Push Method of administration may be changed at the discretion of home infusion pharmacist based upon assessment of the patient and/or caregiver's ability to self-administer the medication ordered.  . cycloSPORINE (RESTASIS) 0.05 % ophthalmic emulsion Place 1 drop into both eyes daily as needed (dry eyes).  Marland Kitchen donepezil (ARICEPT) 10 MG tablet Take 1 tablet (10 mg total) by mouth at bedtime.  . feeding supplement (ENSURE ENLIVE / ENSURE PLUS) LIQD Take 237 mLs by mouth 3 (three) times daily between meals.  . ferrous sulfate 325 (65 FE) MG  tablet Take 1 tablet (325 mg total) by mouth daily with breakfast.  . LINZESS 145 MCG CAPS capsule Take 145 mcg by mouth daily.  . memantine (NAMENDA XR) 28 MG CP24 24 hr capsule Take 1 capsule (28 mg total) by mouth daily. (Patient taking differently: No sig reported)  . Multiple Vitamins-Minerals (MULTI-VITAMIN GUMMIES PO) Take 1 each by mouth daily.   . nitroGLYCERIN (NITROSTAT) 0.4 MG SL tablet Place 1 tablet (0.4 mg total) under the tongue every 5 (five) minutes as needed for chest pain.  . polyethylene glycol (MIRALAX / GLYCOLAX) 17 g packet Take 17 g by mouth 2 (two) times daily. (Patient taking differently: Take 17 g by mouth daily as needed for mild constipation.)  . senna-docusate (SENOKOT-S) 8.6-50  MG tablet Take 1 tablet by mouth 2 (two) times daily.   No facility-administered encounter medications on file as of 08/11/2020.    PHYSICAL EXAM/ROS:  General: NAD, cooperative Cardiovascular: regular rate and rhythm; denies chest pain Pulmonary: clear ant /post fields; normal respiratory effort Abdomen: soft, nontender, + bowel sounds Neurological: Weakness but otherwise nonfocal  Note: Portions of this note were generated with Lobbyist. Dictation errors may occur despite best attempts at proofreading.  Teodoro Spray, NP

## 2020-08-15 ENCOUNTER — Telehealth: Payer: Medicare Other | Admitting: Cardiology

## 2020-08-22 ENCOUNTER — Non-Acute Institutional Stay: Payer: Medicare Other | Admitting: Hospice

## 2020-08-22 ENCOUNTER — Other Ambulatory Visit: Payer: Self-pay

## 2020-08-22 DIAGNOSIS — Z515 Encounter for palliative care: Secondary | ICD-10-CM

## 2020-08-22 DIAGNOSIS — L89120 Pressure ulcer of left upper back, unstageable: Secondary | ICD-10-CM

## 2020-08-22 NOTE — Progress Notes (Signed)
[facility] Consult Note Telephone: [phone]  Fax: [phone]  PATIENT NAME: [name] DOB: January 12, 1941 MRN: [id number]  PRIMARY CARE PROVIDER:   Nolene Ebbs, MD Sarah Ebbs, MD [address] [place],  [place] [address]  REFERRING PROVIDER:   Martinique Blattenberger, NP  RESPONSIBLE PARTY:   Extended Emergency Contact Information Primary Emergency Contact: [name] Phone: [phone] Relation: Son Secondary Emergency Contact: [name] Phone: [phone] Relation: Other  I met face to face with patient and family in home/facility.  ADVANCE CARE PLANNING/RECOMMENDATIONS/PLAN:    Visits to build trust and follow-up on palliative care.  Visit consisted of counseling and education dealing with the complex and emotionally intense issues of symptom management and palliative care in the setting of serious and potentially life-threatening illness.  Patient said she is taking it one day at a time.  Palliative care team will continue to support patient, patient's family, and medical team  CODE STATUS: patient is a partial code-chest compressions with no intubation  GOALS OF CARE: Goals of care include to maximize quality of life and symptom management.  Follow up Palliative Care Visit: Palliative care will continue to follow for goals of care clarification and symptom management.  Follow-up in a month/as needed  Symptom Management:  Primary nurse reports unstageable ulcer with exposed hardware located in thoracic lumbar spine is improving; patient continues on cefazolin IV with stop date 120 03/2021.  Patient reports feeling better, not in pain/discomfort.  No fever, no chills.  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  Nursing with no complaints.  Encouraged ongoing care. Palliative will continue to monitor for symptom management/decline and make recommendations as needed.  Family /Caregiver/Community Supports: Patient in SNF for ongoing care  I spent 46 minutes providing this  consultation; time includes time spent with patient/family, chart review, provider coordination,  and documentation. More than 50% of the time in this consultation was spent on counseling patient and coordinating communication.   CHIEF COMPLAIN/HISTORY OF PRESENT ILLNESS:  [name] is a 80 y.o. 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  She continues on cefazolin till 09/08/2020; also improving.  Patient is compliant with treatment, reports no worsening symptoms, no pain or discomfort.  Patient with history of DVT, Dementia.  History obtained from review of EMR, primary RN and with patient/son.   Palliative Care was asked to follow this patient by consultation request of Sarah Blattenberger, NP to help address advance care planning and complex decision making. Thank you for the opportunity to participate in the care of [name]: Partial code  PPS: 30%  HOSPICE ELIGIBILITY/DIAGNOSIS: TBD  PAST MEDICAL HISTORY:  Past Medical History:  Diagnosis Date  . Arthritis   . Asthma   . Chronic back pain   . Depression   . History of pulmonary embolus (PE)   . Hx-TIA (transient ischemic attack)   . Hyperlipemia   . Hypertension   . Personal history of DVT (deep vein thrombosis)   . Scoliosis   . Sleep apnea    cpap - settingsi at 3   . Stroke ([place])   . 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 Smoking status: Former Smoker    Packs/day: 1.00    Years: 10.00    Pack years: 10.00    Types: Cigarettes     Quit date: 08/12/1973    Years since quitting: 47.0  . Smokeless tobacco: Never Used  Substance Use Topics  . Alcohol use: No    ALLERGIES:  Allergies  Allergen Reactions  . T-Pa [Alteplase] Swelling    Does okay with it if premedicated with benadryl.  . Codeine Itching     PERTINENT MEDICATIONS:  Outpatient Encounter Medications as of 08/22/2020  Medication Sig  . acetaminophen (TYLENOL) 325 MG tablet Take 2 tablets (650 mg total) by mouth every 4 (four) hours as needed for mild pain or moderate pain (or Fever >/= 101).  . busPIRone (BUSPAR) 15 MG tablet Take 1 tablet (15 mg total) by mouth 2 (two) times daily.  Marland Kitchen ceFAZolin (ANCEF) IVPB Inject 2 g into the vein every 8 (eight) hours. Indication:  Sacral osteo/exposed hardware First Dose: Yes Last Day of Therapy:  09/08/20 Labs - Once weekly:  CBC/D and BMP, Labs - Every other week:  ESR and CRP Method of administration: IV Push Method of administration may be changed at the discretion of home infusion pharmacist based upon assessment of the patient and/or caregiver's ability to self-administer the medication ordered.  . cycloSPORINE (RESTASIS) 0.05 % ophthalmic emulsion Place 1 drop into both eyes daily as needed (dry eyes).  Marland Kitchen donepezil (ARICEPT) 10 MG tablet Take 1 tablet (10 mg total) by mouth at bedtime.  . feeding supplement (ENSURE ENLIVE / ENSURE PLUS) LIQD Take 237 mLs by mouth 3 (three) times daily between meals.  . ferrous sulfate 325 (65 FE) MG tablet Take 1 tablet (325 mg total) by mouth daily with breakfast.  . LINZESS 145 MCG CAPS capsule Take 145 mcg by mouth daily.  . memantine (NAMENDA XR) 28 MG CP24 24 hr capsule Take 1 capsule (28 mg total) by mouth daily. (Patient taking differently: No sig reported)  . Multiple Vitamins-Minerals (MULTI-VITAMIN GUMMIES PO) Take 1 each by mouth daily.   . nitroGLYCERIN (NITROSTAT) 0.4 MG SL tablet Place 1 tablet (0.4 mg total) under the tongue every 5 (five) minutes as needed for  chest pain.  . polyethylene glycol (MIRALAX / GLYCOLAX) 17 g packet Take 17 g by mouth 2 (two) times daily. (Patient taking differently: Take 17 g by mouth daily as needed for mild constipation.)  . senna-docusate (SENOKOT-S) 8.6-50 MG tablet Take 1 tablet by mouth 2 (two) times daily.   No facility-administered encounter medications on file as of 08/22/2020.    PHYSICAL EXAM/ROS: General: In no acute distress Cardiovascular: regular rate and rhythm; denies chest pain Pulmonary: clear ant /post fields; normal respiratory effort Abdomen: soft, nontender, + bowel sounds Neurological: Weakness but otherwise nonfocal Note:  Portions of this note were generated with Lobbyist. Dictation errors may occur despite attempts at proofreading.  Teodoro Spray, NP

## 2020-09-04 ENCOUNTER — Encounter: Payer: Self-pay | Admitting: Internal Medicine

## 2020-09-04 ENCOUNTER — Other Ambulatory Visit: Payer: Self-pay

## 2020-09-04 ENCOUNTER — Telehealth (INDEPENDENT_AMBULATORY_CARE_PROVIDER_SITE_OTHER): Payer: Medicare Other | Admitting: Internal Medicine

## 2020-09-04 DIAGNOSIS — M869 Osteomyelitis, unspecified: Secondary | ICD-10-CM

## 2020-09-04 DIAGNOSIS — L8990 Pressure ulcer of unspecified site, unspecified stage: Secondary | ICD-10-CM

## 2020-09-04 DIAGNOSIS — T847XXD Infection and inflammatory reaction due to other internal orthopedic prosthetic devices, implants and grafts, subsequent encounter: Secondary | ICD-10-CM

## 2020-09-04 NOTE — Progress Notes (Addendum)
[facility] for Infectious Disease  Reason for Consult: hospital follow up for osteo  Referring Provider: hospital follow up   HPI:    [name] is a 80 y.o. female who presents to clinic for hospital follow up of osteomyelitis.     She was hospitalized in December for a sacral wound with exposed bone and hardware. She underwent debridement and removal of spinal fusion hardware on 12/16 with NSGY.  There were no cultures done in the OR but a wound cx at admission grew E coli and Strep anginosus.  She was discharged to a SNF on Ancef for 6 weeks.  She has been tolerating abx without issues but unfortunately developed a small pressure ulcer above her coccyx at the SNF.  Her son reports this wound is superficial and about the size of a nickel.  Wound care nursing has been coming out to the house to help with wound management.  She sees her surgeon on Friday 1/28 at 145pm.  Patient's Medications  New Prescriptions   No medications on file  Previous Medications   ACETAMINOPHEN (TYLENOL) 325 MG TABLET    Take 2 tablets (650 mg total) by mouth every 4 (four) hours as needed for mild pain or moderate pain (or Fever >/= 101).   BUSPIRONE (BUSPAR) 15 MG TABLET    Take 1 tablet (15 mg total) by mouth 2 (two) times daily.   CEFAZOLIN (ANCEF) IVPB    Inject 2 g into the vein every 8 (eight) hours. Indication:  Sacral osteo/exposed hardware First Dose: Yes Last Day of Therapy:  09/08/20 Labs - Once weekly:  CBC/D and BMP, Labs - Every other week:  ESR and CRP Method of administration: IV Push Method of administration may be changed at the discretion of home infusion pharmacist based upon assessment of the patient and/or caregiver's ability to self-administer the medication ordered.   CYCLOSPORINE (RESTASIS) 0.05 % OPHTHALMIC EMULSION    Place 1 drop into both eyes daily as needed (dry eyes).   DONEPEZIL (ARICEPT) 10 MG TABLET    Take 1 tablet (10 mg total) by mouth at bedtime.    FEEDING SUPPLEMENT (ENSURE ENLIVE / ENSURE PLUS) LIQD    Take 237 mLs by mouth 3 (three) times daily between meals.   FERROUS SULFATE 325 (65 FE) MG TABLET    Take 1 tablet (325 mg total) by mouth daily with breakfast.   LINZESS 145 MCG CAPS CAPSULE    Take 145 mcg by mouth daily.   MEMANTINE (NAMENDA XR) 28 MG CP24 24 HR CAPSULE    Take 1 capsule (28 mg total) by mouth daily.   MULTIPLE VITAMINS-MINERALS (MULTI-VITAMIN GUMMIES PO)    Take 1 each by mouth daily.    NITROGLYCERIN (NITROSTAT) 0.4 MG SL TABLET    Place 1 tablet (0.4 mg total) under the tongue every 5 (five) minutes as needed for chest pain.   POLYETHYLENE GLYCOL (MIRALAX / GLYCOLAX) 17 G PACKET    Take 17 g by mouth 2 (two) times daily.   SENNA-DOCUSATE (SENOKOT-S) 8.6-50 MG TABLET    Take 1 tablet by mouth 2 (two) times daily.  Modified Medications   No medications on file  Discontinued Medications   No medications on file      Past Medical History:  Diagnosis Date  . Arthritis   . Asthma   . Chronic back pain   . Depression   . History of pulmonary embolus (PE)   . Hx-TIA (  transient ischemic attack)   . Hyperlipemia   . Hypertension   . Personal history of DVT (deep vein thrombosis)   . Scoliosis   . Sleep apnea    cpap - settingsi at 2   . Stroke ([place])   . 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 Smoking status: Former Smoker    Packs/day: 1.00    Years: 10.00    Pack years: 10.00    Types: Cigarettes    Quit date: 08/12/1973    Years since quitting: 47.0  . Smokeless tobacco: Never Used  Vaping Use  . Vaping Use: Never used  Substance Use Topics  . Alcohol use: No  . Drug use: No    Family History  Problem Relation Age of Onset  . Other Father        kidney problems    Allergies  Allergen Reactions  . T-Pa [Alteplase] Swelling    Does okay with it if premedicated with benadryl.  . Codeine Itching    Review of Systems  Constitutional: Negative for chills and  fever.  Gastrointestinal: Negative for nausea and vomiting.  Musculoskeletal: Positive for back pain.      OBJECTIVE:     Physical Exam Telephone visit.  Patient sounded well and in no distress.   Labs and Microbiology:  CBC Latest Ref Rng & Units 08/04/2020 08/03/2020 08/02/2020  WBC 4.0 - 10.5 K/uL 16.7(H) 12.6(H) 12.9(H)  Hemoglobin 12.0 - 15.0 g/dL 8.6(L) 8.2(L) 8.3(L)  Hematocrit 36.0 - 46.0 % 28.4(L) 27.3(L) 26.1(L)  Platelets 150 - 400 K/uL 250 256 253   CMP Latest Ref Rng & Units 08/04/2020 08/03/2020 08/02/2020  Glucose 70 - 99 mg/dL 80 77 91  BUN 8 - 23 mg/dL 7(L) 5(L) 14  Creatinine 0.44 - 1.00 mg/dL 0.39(L) 0.41(L) 0.39(L)  Sodium 135 - 145 mmol/L 136 140 140  Potassium 3.5 - 5.1 mmol/L 4.0 3.6 3.6  Chloride 98 - 111 mmol/L 101 104 105  CO2 22 - 32 mmol/L _0 Calcium 8.9 - 10.3 mg/dL 8.8(L) 8.6(L) 8.4(L)  Total Protein 6.5 - 8.1 g/dL - - 5.2(L)  Total Bilirubin 0.3 - 1.2 mg/dL - - 0.7  Alkaline Phos 38 - 126 U/L - - 57  AST 15 - 41 U/L - - 22  ALT 0 - 44 U/L - - 8        ASSESSMENT & PLAN:    1. Osteomyelitis 2. Hardware complicating wound infection 3. Pressure injury of skin  She is a little over 5 weeks into therapy for her presumed osteomyelitis in the setting of sacral wound with exposed bone and hardware s/p debridement and removal of hardware 07/27/20.  She also has a new superficial wound that developed at her SNF that is now receiving more appropriate attention at home with wound care managing.   Baseline ESR = 90 (07/24/20) Baseline CRP = 3.7 (07/24/20)  PLAN: -- continue IV cefazolin through planned EOT 09/08/20 -- continue wound care for new small superficial wound -- follow up with NSGY as scheduled -- will review labs from today.  If looks good, will plan for completing abx and removing PICC line -- palliative care also following patient    Phone visit Pt location: home My location: office Time of encounter = 20  min  Virtual Visit via Telephone Note   I connected with [name] on 09/04/20 at 10:45am by telephone and verified that I am speaking with  the correct person using two identifiers.   I discussed the limitations, risks, security and privacy concerns of performing an evaluation and management service by telephone and the availability of in person appointments. I also discussed with the patient that there may be a patient responsible charge related to this service. The patient expressed understanding and agreed to proceed.   [name] for Infectious Disease [facility] 09/04/2020, 10:35 AM

## 2020-09-21 ENCOUNTER — Other Ambulatory Visit: Payer: Medicare Other | Admitting: Student

## 2020-09-21 ENCOUNTER — Other Ambulatory Visit: Payer: Self-pay

## 2020-09-21 DIAGNOSIS — L89159 Pressure ulcer of sacral region, unspecified stage: Secondary | ICD-10-CM

## 2020-09-21 DIAGNOSIS — Z515 Encounter for palliative care: Secondary | ICD-10-CM

## 2020-09-21 NOTE — Progress Notes (Signed)
[facility] Consult Note Telephone: [phone]  Fax: [phone]  PATIENT NAME: [name] [id number] (home)  DOB: May 29, 1941 MRN: [id number]  PRIMARY CARE PROVIDER:    Nolene Ebbs, MD,  [place] [address] [phone]  REFERRING PROVIDER:   Nolene Ebbs, MD [address] [place],  [place] [address] [phone]  RESPONSIBLE PARTY:   Extended Emergency Contact Information Primary Emergency Contact: [place] Phone: [phone] Relation: Son Secondary Emergency Contact: [place] Phone: [phone] Relation: Other  I met face to face with patient and family in home.   ASSESSMENT AND RECOMMENDATIONS:   Advance Care Planning: Visit at the request of Dr. Jeanie Cooks for palliative consult. Visit consisted of building trust and discussions on Palliative Medicine as specialized medical care for people living with serious illness, aimed at facilitating better quality of life through symptoms relief, assisting with advance care plan and establishing goals of care. Education provided on Palliative Medicine. Reviewed advanced directives; continue limited code. Palliative care will continue to provide support to patient, family and the medical team.  Goal of care: For wounds to heal.  Directives: Limite Code, attempt CPR, but no mechanical ventilation.   Symptom Management:   Pain-currently receiving oxycodone 53m every 6 hours prn; managed by pain management clinic. Continue prn oxycodone. Pain managed by Dr. GMelina Fiddler   Wound-healing unstageable wound to sacral region; note per home health note reports 4 new areas to back. Family changing dressing daily, home health managing care, current wound orders are calcium alginate to wound bed, cover with foam dressing,  changed every 3 days or PRN soilage. Son awaiting air mattress. Turn and reposition every 2 hours.  Follow up Palliative Care Visit: Palliative care will continue to follow for goals of care clarification and symptom management. Return 6 weeks or prn.  Family /Caregiver/Community Supports: Continue HH for wound care/treatment. Palliative Medicine will continue to provide support to patient and family.   Cognitive / Functional decline: Patient is bed bound; she is dependent for all adls. Family assist with all adl care needs.   I spent 40 minutes providing this consultation, time includes time spent with patient/family, chart review, provider coordination, and documentation. More than 50% of the time in this consultation was spent counseling and coordinating communication.   CHIEF COMPLAINT: Palliative Medicine follow up visit.   History obtained from review of EMR, discussion with patient and  interview with family/caregiver. Records reviewed summarized below.  HISTORY OF PRESENT ILLNESS:  Sarah MCCLARTYis a 80y.o. year old female with multiple medical problems including unstageable ulcer to sacral region. 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 Patient discharged home with son on 08/28/20 from SNF. She has completed IV cefazolin in the past 2 weeks; PICC line removed. Son reports original wound is healing, although she now has 4 new areas as a result of diaper/brief. Dx also include  history of DVT, dementia, asthma, chronic back pain, depression, hx of PE, TIA, hyperlipidemia, hypertension, scoliosis, sleep apnea with CPAP, stroke, Takotsubo cardiomyopathy, arthritis. Patient with reports of pain to back, left arm and right leg pain with movement; takes oxycodone PRN with relief. Pain managed by pain management clinic/provider. Appetite is fair; eating soft diet. Endorses occasional nausea; receives prn prochlorperazine with effectiveness. Now  having regular bowel movements; receives Linzess and dulcolax  suppository if severe constipation. She is sleeping well at night.  Palliative Care was asked to follow this patient by consultation request of Nolene Ebbs, MD to help address advance care planning and goals of care. This is a follow up visit.    CODE STATUS: Limited Code.  PPS: 30%  HOSPICE ELIGIBILITY/DIAGNOSIS: TBD  PHYSICAL EXAM / ROS:    General: NAD, chronically ill appearing, well nourished Cardiovascular: RRR, no chest pain reported, edema to left upper extremity Pulmonary: LCTA, no cough, no increased SOB, room air GI: Abdomen soft, non-tender to touch; bowel sounds normoactive x 4. appetite fair, endorses constipation, incontinent of bowel GU: denies dysuria, continent of urine, purewick system in place MSK:  Non-ambulatory, right sided hemiplegia Skin: unstageable wound to sacrum; not observed today, dressing changed earlier by [facility] nurse Neurological: alert and oriented x 2. Weakness, but otherwise nonfocal Psych: non -anxious affect  PAST MEDICAL HISTORY:  Past Medical History:  Diagnosis Date  . Arthritis   . Asthma   . Chronic back pain   . Depression   . History of pulmonary embolus (PE)   . Hx-TIA (transient ischemic attack)   . Hyperlipemia   . Hypertension   . Personal history of DVT (deep vein thrombosis)   . Scoliosis   . Sleep apnea    cpap - settingsi at 2   . Stroke ([place])   . 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 Smoking status: Former Smoker    Packs/day: 1.00    Years: 10.00    Pack years: 10.00    Types: Cigarettes    Quit date: 08/12/1973    Years since quitting: 47.1  . Smokeless tobacco: Never Used  Substance Use Topics  . Alcohol use: No   FAMILY HX:  Family History  Problem Relation Age of Onset  . Other Father        kidney problems    ALLERGIES:  Allergies  Allergen Reactions  . T-Pa [Alteplase] Swelling    Does  okay with it if premedicated with benadryl.  . Codeine Itching     PERTINENT MEDICATIONS:  Outpatient Encounter Medications as of 09/21/2020  Medication Sig  . acetaminophen (TYLENOL) 325 MG tablet Take 2 tablets (650 mg total) by mouth every 4 (four) hours as needed for mild pain or moderate pain (or Fever >/= 101).  . busPIRone (BUSPAR) 15 MG tablet Take 1 tablet (15 mg total) by mouth 2 (two) times daily.  . cycloSPORINE (RESTASIS) 0.05 % ophthalmic emulsion Place 1 drop into both eyes daily as needed (dry eyes).  Marland Kitchen donepezil (ARICEPT) 10 MG tablet Take 1 tablet (10 mg total) by mouth at bedtime.  . feeding supplement (ENSURE ENLIVE / ENSURE PLUS) LIQD Take 237 mLs by mouth 3 (three) times daily between meals.  . ferrous sulfate 325 (65 FE) MG tablet Take 1 tablet (325 mg total) by mouth daily with breakfast.  . LINZESS 145 MCG CAPS capsule Take 145 mcg by mouth daily.  . memantine (NAMENDA XR) 28 MG CP24 24 hr capsule Take 1 capsule (28 mg total) by mouth daily. (Patient taking differently: Take 28 mg by mouth at bedtime.)  . Multiple Vitamins-Minerals (MULTI-VITAMIN GUMMIES PO) Take 1 each by mouth daily.   . nitroGLYCERIN (NITROSTAT) 0.4 MG SL tablet Place 1 tablet (0.4 mg total) under the tongue every 5 (five) minutes as needed for chest pain.  . polyethylene glycol (MIRALAX / GLYCOLAX) 17 g  packet Take 17 g by mouth 2 (two) times daily. (Patient taking differently: Take 17 g by mouth daily as needed for mild constipation.)  . senna-docusate (SENOKOT-S) 8.6-50 MG tablet Take 1 tablet by mouth 2 (two) times daily.   No facility-administered encounter medications on file as of 09/21/2020.     Thank you for the opportunity to participate in the care of Ms. Raczynski. The palliative care team will continue to follow. Please call our office at [phone] if we can be of additional assistance.  Ezekiel Slocumb, NP , AGPCNP-C

## 2020-09-26 ENCOUNTER — Telehealth: Payer: Self-pay

## 2020-09-26 NOTE — Telephone Encounter (Signed)
Phone call placed to [employer] to check on status of order for air mattress for hospital bed. Per [employer], all required documentation was received and is being processed. Family will receive a call with time for delivery.

## 2020-10-19 ENCOUNTER — Inpatient Hospital Stay (HOSPITAL_COMMUNITY)
Admission: EM | Admit: 2020-10-19 | Discharge: 2020-11-07 | DRG: 592 | Disposition: A | Discharge status: Home Health | Payer: Medicare Other | Attending: Internal Medicine | Admitting: Internal Medicine

## 2020-10-19 ENCOUNTER — Emergency Department (HOSPITAL_COMMUNITY): Payer: Medicare Other

## 2020-10-19 ENCOUNTER — Encounter (HOSPITAL_COMMUNITY): Payer: Self-pay

## 2020-10-19 ENCOUNTER — Other Ambulatory Visit: Payer: Self-pay

## 2020-10-19 DIAGNOSIS — Z515 Encounter for palliative care: Secondary | ICD-10-CM | POA: Diagnosis not present

## 2020-10-19 DIAGNOSIS — M4625 Osteomyelitis of vertebra, thoracolumbar region: Secondary | ICD-10-CM | POA: Diagnosis present

## 2020-10-19 DIAGNOSIS — Z20822 Contact with and (suspected) exposure to covid-19: Secondary | ICD-10-CM | POA: Diagnosis present

## 2020-10-19 DIAGNOSIS — L8990 Pressure ulcer of unspecified site, unspecified stage: Secondary | ICD-10-CM

## 2020-10-19 DIAGNOSIS — L89303 Pressure ulcer of unspecified buttock, stage 3: Secondary | ICD-10-CM | POA: Diagnosis present

## 2020-10-19 DIAGNOSIS — M4624 Osteomyelitis of vertebra, thoracic region: Secondary | ICD-10-CM | POA: Diagnosis not present

## 2020-10-19 DIAGNOSIS — E876 Hypokalemia: Secondary | ICD-10-CM | POA: Diagnosis not present

## 2020-10-19 DIAGNOSIS — Z7401 Bed confinement status: Secondary | ICD-10-CM

## 2020-10-19 DIAGNOSIS — Z66 Do not resuscitate: Secondary | ICD-10-CM | POA: Diagnosis not present

## 2020-10-19 DIAGNOSIS — D638 Anemia in other chronic diseases classified elsewhere: Secondary | ICD-10-CM | POA: Diagnosis present

## 2020-10-19 DIAGNOSIS — L89893 Pressure ulcer of other site, stage 3: Secondary | ICD-10-CM | POA: Diagnosis present

## 2020-10-19 DIAGNOSIS — E440 Moderate protein-calorie malnutrition: Secondary | ICD-10-CM | POA: Diagnosis present

## 2020-10-19 DIAGNOSIS — Z79899 Other long term (current) drug therapy: Secondary | ICD-10-CM

## 2020-10-19 DIAGNOSIS — Z86711 Personal history of pulmonary embolism: Secondary | ICD-10-CM

## 2020-10-19 DIAGNOSIS — J45909 Unspecified asthma, uncomplicated: Secondary | ICD-10-CM | POA: Diagnosis present

## 2020-10-19 DIAGNOSIS — Z7189 Other specified counseling: Secondary | ICD-10-CM | POA: Diagnosis not present

## 2020-10-19 DIAGNOSIS — N179 Acute kidney failure, unspecified: Secondary | ICD-10-CM | POA: Diagnosis present

## 2020-10-19 DIAGNOSIS — R7881 Bacteremia: Secondary | ICD-10-CM | POA: Diagnosis present

## 2020-10-19 DIAGNOSIS — G8929 Other chronic pain: Secondary | ICD-10-CM | POA: Diagnosis present

## 2020-10-19 DIAGNOSIS — B957 Other staphylococcus as the cause of diseases classified elsewhere: Secondary | ICD-10-CM | POA: Diagnosis present

## 2020-10-19 DIAGNOSIS — M4628 Osteomyelitis of vertebra, sacral and sacrococcygeal region: Secondary | ICD-10-CM | POA: Diagnosis present

## 2020-10-19 DIAGNOSIS — Z8673 Personal history of transient ischemic attack (TIA), and cerebral infarction without residual deficits: Secondary | ICD-10-CM

## 2020-10-19 DIAGNOSIS — Z885 Allergy status to narcotic agent status: Secondary | ICD-10-CM

## 2020-10-19 DIAGNOSIS — L89140 Pressure ulcer of left lower back, unstageable: Principal | ICD-10-CM | POA: Diagnosis present

## 2020-10-19 DIAGNOSIS — J9601 Acute respiratory failure with hypoxia: Secondary | ICD-10-CM | POA: Diagnosis not present

## 2020-10-19 DIAGNOSIS — I10 Essential (primary) hypertension: Secondary | ICD-10-CM | POA: Diagnosis present

## 2020-10-19 DIAGNOSIS — L8994 Pressure ulcer of unspecified site, stage 4: Secondary | ICD-10-CM

## 2020-10-19 DIAGNOSIS — L089 Local infection of the skin and subcutaneous tissue, unspecified: Secondary | ICD-10-CM

## 2020-10-19 DIAGNOSIS — Z87891 Personal history of nicotine dependence: Secondary | ICD-10-CM

## 2020-10-19 DIAGNOSIS — L0390 Cellulitis, unspecified: Secondary | ICD-10-CM | POA: Diagnosis present

## 2020-10-19 DIAGNOSIS — E870 Hyperosmolality and hypernatremia: Secondary | ICD-10-CM | POA: Diagnosis present

## 2020-10-19 DIAGNOSIS — J690 Pneumonitis due to inhalation of food and vomit: Secondary | ICD-10-CM | POA: Diagnosis not present

## 2020-10-19 DIAGNOSIS — Z86718 Personal history of other venous thrombosis and embolism: Secondary | ICD-10-CM

## 2020-10-19 DIAGNOSIS — F0390 Unspecified dementia without behavioral disturbance: Secondary | ICD-10-CM | POA: Diagnosis present

## 2020-10-19 DIAGNOSIS — F32A Depression, unspecified: Secondary | ICD-10-CM | POA: Diagnosis present

## 2020-10-19 DIAGNOSIS — M549 Dorsalgia, unspecified: Secondary | ICD-10-CM | POA: Diagnosis present

## 2020-10-19 DIAGNOSIS — E785 Hyperlipidemia, unspecified: Secondary | ICD-10-CM | POA: Diagnosis present

## 2020-10-19 DIAGNOSIS — I5181 Takotsubo syndrome: Secondary | ICD-10-CM | POA: Diagnosis present

## 2020-10-19 DIAGNOSIS — R001 Bradycardia, unspecified: Secondary | ICD-10-CM | POA: Diagnosis not present

## 2020-10-19 DIAGNOSIS — L89324 Pressure ulcer of left buttock, stage 4: Secondary | ICD-10-CM | POA: Diagnosis present

## 2020-10-19 DIAGNOSIS — E861 Hypovolemia: Secondary | ICD-10-CM | POA: Diagnosis present

## 2020-10-19 DIAGNOSIS — R1310 Dysphagia, unspecified: Secondary | ICD-10-CM | POA: Diagnosis present

## 2020-10-19 DIAGNOSIS — M1990 Unspecified osteoarthritis, unspecified site: Secondary | ICD-10-CM | POA: Diagnosis present

## 2020-10-19 DIAGNOSIS — G9341 Metabolic encephalopathy: Secondary | ICD-10-CM | POA: Diagnosis not present

## 2020-10-19 DIAGNOSIS — E872 Acidosis: Secondary | ICD-10-CM | POA: Diagnosis present

## 2020-10-19 DIAGNOSIS — Z888 Allergy status to other drugs, medicaments and biological substances status: Secondary | ICD-10-CM

## 2020-10-19 DIAGNOSIS — R0902 Hypoxemia: Secondary | ICD-10-CM | POA: Diagnosis not present

## 2020-10-19 DIAGNOSIS — R059 Cough, unspecified: Secondary | ICD-10-CM

## 2020-10-19 DIAGNOSIS — R6251 Failure to thrive (child): Secondary | ICD-10-CM | POA: Diagnosis not present

## 2020-10-19 DIAGNOSIS — R627 Adult failure to thrive: Secondary | ICD-10-CM | POA: Diagnosis not present

## 2020-10-19 DIAGNOSIS — Z7901 Long term (current) use of anticoagulants: Secondary | ICD-10-CM

## 2020-10-19 DIAGNOSIS — D696 Thrombocytopenia, unspecified: Secondary | ICD-10-CM | POA: Diagnosis present

## 2020-10-19 DIAGNOSIS — A419 Sepsis, unspecified organism: Secondary | ICD-10-CM | POA: Diagnosis present

## 2020-10-19 DIAGNOSIS — L8995 Pressure ulcer of unspecified site, unstageable: Secondary | ICD-10-CM | POA: Diagnosis not present

## 2020-10-19 DIAGNOSIS — J150 Pneumonia due to Klebsiella pneumoniae: Secondary | ICD-10-CM | POA: Diagnosis present

## 2020-10-19 DIAGNOSIS — Z6824 Body mass index (BMI) 24.0-24.9, adult: Secondary | ICD-10-CM

## 2020-10-19 DIAGNOSIS — G4733 Obstructive sleep apnea (adult) (pediatric): Secondary | ICD-10-CM | POA: Diagnosis present

## 2020-10-19 DIAGNOSIS — E860 Dehydration: Secondary | ICD-10-CM | POA: Diagnosis present

## 2020-10-19 DIAGNOSIS — M7989 Other specified soft tissue disorders: Secondary | ICD-10-CM | POA: Diagnosis not present

## 2020-10-19 DIAGNOSIS — Z95828 Presence of other vascular implants and grafts: Secondary | ICD-10-CM

## 2020-10-19 LAB — BASIC METABOLIC PANEL
Anion gap: 10 (ref 5–15)
BUN: 16 mg/dL (ref 8–23)
CO2: 30 mmol/L (ref 22–32)
Calcium: 9.7 mg/dL (ref 8.9–10.3)
Chloride: 121 mmol/L — ABNORMAL HIGH (ref 98–111)
Creatinine, Ser: 0.58 mg/dL (ref 0.44–1.00)
GFR, Estimated: >60 mL/min (ref >60)
Glucose, Bld: 104 mg/dL — ABNORMAL HIGH (ref 70–99)
Potassium: 3.1 mmol/L — ABNORMAL LOW (ref 3.5–5.1)
Sodium: 161 mmol/L (ref 135–145)

## 2020-10-19 LAB — I-STAT CHEM 8, ED
BUN: 15 mg/dL (ref 8–23)
Calcium, Ion: 1.25 mmol/L (ref 1.15–1.40)
Chloride: 118 mmol/L — ABNORMAL HIGH (ref 98–111)
Creatinine, Ser: 0.5 mg/dL (ref 0.44–1.00)
Glucose, Bld: 99 mg/dL (ref 70–99)
HCT: 36 % (ref 36.0–46.0)
Hemoglobin: 12.2 g/dL (ref 12.0–15.0)
Potassium: 3 mmol/L — ABNORMAL LOW (ref 3.5–5.1)
Sodium: 161 mmol/L (ref 135–145)
TCO2: 30 mmol/L (ref 22–32)

## 2020-10-19 LAB — CBC WITH DIFFERENTIAL/PLATELET
Abs Immature Granulocytes: 0.06 10*3/uL (ref 0.00–0.07)
Basophils Absolute: 0 10*3/uL (ref 0.0–0.1)
Basophils Relative: 0 %
Eosinophils Absolute: 0.1 10*3/uL (ref 0.0–0.5)
Eosinophils Relative: 1 %
HCT: 40.2 % (ref 36.0–46.0)
Hemoglobin: 11.7 g/dL — ABNORMAL LOW (ref 12.0–15.0)
Immature Granulocytes: 1 %
Lymphocytes Relative: 19 %
Lymphs Abs: 2.1 10*3/uL (ref 0.7–4.0)
MCH: 31.2 pg (ref 26.0–34.0)
MCHC: 29.1 g/dL — ABNORMAL LOW (ref 30.0–36.0)
MCV: 107.2 fL — ABNORMAL HIGH (ref 80.0–100.0)
Monocytes Absolute: 0.6 10*3/uL (ref 0.1–1.0)
Monocytes Relative: 5 %
Neutro Abs: 8.4 10*3/uL — ABNORMAL HIGH (ref 1.7–7.7)
Neutrophils Relative %: 74 %
Platelets: 121 10*3/uL — ABNORMAL LOW (ref 150–400)
RBC: 3.75 MIL/uL — ABNORMAL LOW (ref 3.87–5.11)
RDW: 18.7 % — ABNORMAL HIGH (ref 11.5–15.5)
WBC: 11.2 10*3/uL — ABNORMAL HIGH (ref 4.0–10.5)
nRBC: 1.3 % — ABNORMAL HIGH (ref 0.0–0.2)

## 2020-10-19 LAB — LACTIC ACID, PLASMA
Lactic Acid, Venous: 1.5 mmol/L (ref 0.5–1.9)
Lactic Acid, Venous: 2.7 mmol/L (ref 0.5–1.9)

## 2020-10-19 LAB — SEDIMENTATION RATE: Sed Rate: 61 mm/hr — ABNORMAL HIGH (ref 0–22)

## 2020-10-19 LAB — URINALYSIS, ROUTINE W REFLEX MICROSCOPIC
Bilirubin Urine: NEGATIVE
Glucose, UA: NEGATIVE mg/dL
Hgb urine dipstick: NEGATIVE
Ketones, ur: NEGATIVE mg/dL
Leukocytes,Ua: NEGATIVE
Nitrite: NEGATIVE
Protein, ur: NEGATIVE mg/dL
Specific Gravity, Urine: 1.021 (ref 1.005–1.030)
pH: 9 — ABNORMAL HIGH (ref 5.0–8.0)

## 2020-10-19 LAB — C-REACTIVE PROTEIN: CRP: 1.6 mg/dL — ABNORMAL HIGH (ref <1.0)

## 2020-10-19 MED ORDER — OXYCODONE HCL 5 MG PO TABS
7.5000 mg | ORAL_TABLET | ORAL | Status: DC | PRN
  Administered 2020-10-20 – 2020-10-25 (×4): 7.5 mg via ORAL
  Filled 2020-10-19 (×4): qty 2

## 2020-10-19 MED ORDER — DEXTROSE 5 % IV SOLN: INTRAVENOUS | Status: AC

## 2020-10-19 MED ORDER — BUSPIRONE HCL 5 MG PO TABS
15.0000 mg | ORAL_TABLET | Freq: Two times a day (BID) | ORAL | Status: DC
  Administered 2020-10-19 – 2020-11-07 (×29): 15 mg via ORAL
  Filled 2020-10-19 (×3): qty 1
  Filled 2020-10-19: qty 3
  Filled 2020-10-19 (×13): qty 1
  Filled 2020-10-19: qty 3
  Filled 2020-10-19 (×6): qty 1
  Filled 2020-10-19: qty 3
  Filled 2020-10-19 (×5): qty 1

## 2020-10-19 MED ORDER — AMLODIPINE BESYLATE 10 MG PO TABS: 10.0000 mg | ORAL_TABLET | Freq: Every day | ORAL | Status: DC

## 2020-10-19 MED ORDER — SODIUM CHLORIDE 0.9 % IV BOLUS
500.0000 mL | Freq: Once | INTRAVENOUS | Status: AC
  Administered 2020-10-19: 500 mL via INTRAVENOUS

## 2020-10-19 MED ORDER — FERROUS SULFATE 325 (65 FE) MG PO TABS
325.0000 mg | ORAL_TABLET | Freq: Every day | ORAL | Status: DC
  Administered 2020-10-20 – 2020-11-02 (×9): 325 mg via ORAL
  Filled 2020-10-19 (×10): qty 1

## 2020-10-19 MED ORDER — VANCOMYCIN HCL 1000 MG/200ML IV SOLN
1000.0000 mg | Freq: Once | INTRAVENOUS | Status: AC
  Administered 2020-10-20: 1000 mg via INTRAVENOUS
  Filled 2020-10-19: qty 200

## 2020-10-19 MED ORDER — POTASSIUM CHLORIDE CRYS ER 20 MEQ PO TBCR
20.0000 meq | EXTENDED_RELEASE_TABLET | Freq: Once | ORAL | Status: AC
  Administered 2020-10-19: 20 meq via ORAL
  Filled 2020-10-19: qty 1

## 2020-10-19 MED ORDER — CEFAZOLIN SODIUM-DEXTROSE 1-4 GM/50ML-% IV SOLN
1.0000 g | Freq: Once | INTRAVENOUS | Status: AC
  Administered 2020-10-19: 1 g via INTRAVENOUS
  Filled 2020-10-19: qty 50

## 2020-10-19 MED ORDER — LISINOPRIL 5 MG PO TABS: 5.0000 mg | ORAL_TABLET | Freq: Every day | ORAL | Status: DC

## 2020-10-19 MED ORDER — MEMANTINE HCL ER 28 MG PO CP24
28.0000 mg | ORAL_CAPSULE | Freq: Every day | ORAL | Status: DC
  Administered 2020-10-19 – 2020-11-06 (×15): 28 mg via ORAL
  Filled 2020-10-19 (×19): qty 1

## 2020-10-19 MED ORDER — MORPHINE SULFATE (PF) 4 MG/ML IV SOLN
4.0000 mg | Freq: Once | INTRAVENOUS | Status: AC
  Administered 2020-10-19: 4 mg via INTRAVENOUS
  Filled 2020-10-19: qty 1

## 2020-10-19 MED ORDER — ACETAMINOPHEN 325 MG PO TABS
650.0000 mg | ORAL_TABLET | Freq: Four times a day (QID) | ORAL | Status: DC | PRN
  Administered 2020-10-24 – 2020-11-06 (×6): 650 mg via ORAL
  Filled 2020-10-19 (×7): qty 2

## 2020-10-19 MED ORDER — ATORVASTATIN CALCIUM 20 MG PO TABS
20.0000 mg | ORAL_TABLET | Freq: Every evening | ORAL | Status: DC
  Administered 2020-10-19: 20 mg via ORAL
  Filled 2020-10-19: qty 1

## 2020-10-19 MED ORDER — LINACLOTIDE 145 MCG PO CAPS
145.0000 ug | ORAL_CAPSULE | Freq: Every day | ORAL | Status: DC
  Administered 2020-10-20 – 2020-11-07 (×15): 145 ug via ORAL
  Filled 2020-10-19 (×19): qty 1

## 2020-10-19 MED ORDER — DRONABINOL 2.5 MG PO CAPS
2.5000 mg | ORAL_CAPSULE | Freq: Every day | ORAL | Status: DC
  Administered 2020-10-19 – 2020-10-31 (×9): 2.5 mg via ORAL
  Filled 2020-10-19 (×10): qty 1

## 2020-10-19 MED ORDER — DONEPEZIL HCL 10 MG PO TABS
10.0000 mg | ORAL_TABLET | Freq: Every day | ORAL | Status: DC
  Administered 2020-10-19 – 2020-10-24 (×4): 10 mg via ORAL
  Filled 2020-10-19 (×3): qty 1

## 2020-10-19 MED ORDER — SODIUM CHLORIDE 0.9 % IV SOLN
2.0000 g | Freq: Once | INTRAVENOUS | Status: DC
  Filled 2020-10-19: qty 2

## 2020-10-19 MED ORDER — NITROGLYCERIN 0.4 MG SL SUBL: 0.4000 mg | SUBLINGUAL_TABLET | SUBLINGUAL | Status: DC | PRN

## 2020-10-19 MED ORDER — APIXABAN 5 MG PO TABS
5.0000 mg | ORAL_TABLET | Freq: Two times a day (BID) | ORAL | Status: DC
  Administered 2020-10-19 – 2020-10-20 (×3): 5 mg via ORAL
  Filled 2020-10-19 (×3): qty 1

## 2020-10-19 MED ORDER — SODIUM CHLORIDE 0.9 % IV SOLN
2.0000 g | Freq: Two times a day (BID) | INTRAVENOUS | Status: DC
  Administered 2020-10-19 – 2020-10-25 (×13): 2 g via INTRAVENOUS
  Filled 2020-10-19 (×13): qty 2

## 2020-10-19 MED ORDER — ASCORBIC ACID 500 MG PO TABS
1000.0000 mg | ORAL_TABLET | Freq: Every day | ORAL | Status: DC
  Administered 2020-10-24 – 2020-11-02 (×8): 1000 mg via ORAL
  Filled 2020-10-19 (×10): qty 2

## 2020-10-19 MED ORDER — VANCOMYCIN HCL 1250 MG/250ML IV SOLN
1250.0000 mg | INTRAVENOUS | Status: DC
  Administered 2020-10-20: 1250 mg via INTRAVENOUS
  Filled 2020-10-19: qty 250

## 2020-10-19 MED ORDER — MORPHINE SULFATE (PF) 2 MG/ML IV SOLN
1.0000 mg | INTRAVENOUS | Status: DC | PRN
  Administered 2020-10-19 – 2020-10-21 (×2): 1 mg via INTRAVENOUS
  Filled 2020-10-19 (×2): qty 1

## 2020-10-19 MED ORDER — IOHEXOL 300 MG/ML  SOLN
100.0000 mL | Freq: Once | INTRAMUSCULAR | Status: AC | PRN
  Administered 2020-10-19: 100 mL via INTRAVENOUS

## 2020-10-19 MED ORDER — POTASSIUM CHLORIDE 10 MEQ/100ML IV SOLN
10.0000 meq | Freq: Once | INTRAVENOUS | Status: AC
  Administered 2020-10-19: 10 meq via INTRAVENOUS
  Filled 2020-10-19: qty 100

## 2020-10-19 MED ORDER — DEXTROSE-NACL 5-0.45 % IV SOLN: INTRAVENOUS | Status: DC

## 2020-10-19 NOTE — Progress Notes (Signed)
Pharmacy Antibiotic Note  [name] is a 80 y.o. female admitted on 10/19/2020 with significant history of prior spinal surgery with hardware and subsequent hardware related infection requiring hardware removal, history of pressure ulcers to the skin, prior stroke, chronic pain, brought here via EMS from her living facility for evaluation of wounds on her back.  Pharmacy has been consulted to dose vancomycin and cefepime for cellulitis.  Plan: Vancomycin 1gm IV x 1 then 1250mg  q24h Cefepime 2gm IV q12h Follow renal function, cultures and clinical course  Weight: 61.3 kg (135 lb 2.3 oz)  Temp (24hrs), Avg:97.9 F (36.6 C), Min:97.5 F (36.4 C), Max:98.1 F (36.7 C)  Recent Labs  Lab 10/19/20 1611 10/19/20 1711 10/19/20 1714  WBC 11.2*  --   --   CREATININE  --  0.50 0.58  LATICACIDVEN 1.5  --   --     Estimated Creatinine Clearance: 48.3 mL/min (by C-G formula based on SCr of 0.58 mg/dL).    Allergies  Allergen Reactions  . T-Pa [Alteplase] Swelling    Does okay with it if premedicated with benadryl.  . Codeine Itching    Antimicrobials this admission: 3/10 ancef x 1 3/10 cefepime >> 3/10 vanc >> Dose adjustments this admission:   Microbiology results: 3/10 BCx:    Thank you for allowing pharmacy to be a part of this patient's care.  5/10 RPh 10/19/2020, 10:35 PM

## 2020-10-19 NOTE — ED Provider Notes (Signed)
[facility]-EMERGENCY DEPT Provider Note   CSN: [id number] Arrival date & time: 10/19/20  1534     History Chief Complaint  Patient presents with  . Wound Check    [name] is a 80 y.o. female.  The history is provided by the patient, medical records and the EMS personnel. No language interpreter was used.  Wound Check     80 year old female significant history of prior spinal surgery with hardware and subsequent hardware related infection requiring hardware removal, history of pressure ulcers to the skin, prior stroke, chronic pain, brought here via EMS from her living facility for evaluation of wounds on her back.  Patient reports she has constant pain to her back from the wounds for an unknown amount of time.  It is getting progressively worse.  She is unable to describe how the pain feels but it is constant.  She is bedbound.  She has prior stroke affecting her left side.  She does not complain of any fever or chills no nausea vomiting or diarrhea no new numbness no complaints of chest pain or trouble breathing abdominal pain    Past Medical History:  Diagnosis Date  . Arthritis   . Asthma   . Chronic back pain   . Depression   . History of pulmonary embolus (PE)   . Hx-TIA (transient ischemic attack)   . Hyperlipemia   . Hypertension   . Personal history of DVT (deep vein thrombosis)   . Scoliosis   . Sleep apnea    cpap - settingsi at 3   . Stroke (HCC)   . 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 Right knee pain 08/03/2020  . Anemia 08/01/2020  . Osteomyelitis (HCC) 07/23/2020  . Hardware complicating wound infection (HCC) 07/23/2020  . Back pain   . Pressure injury of skin   . Hypocalcemia 07/21/2020  . Palliative care by specialist   . Goals of care, counseling/discussion   . Protein-calorie malnutrition, severe 05/25/2020  . Acute renal failure (ARF) (HCC) 05/24/2020  .  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 Hyperkalemia   . Edema of left upper extremity   . Hematoma   . Prolonged Q-T interval on ECG   . Acute blood loss anemia   . Transaminitis   . Chronic bilateral low back pain without sciatica   . Debility 05/13/2020  . Cellulitis of right hip 05/10/2020  . Elevated troponin 05/05/2020  . Near syncope 05/05/2020  . Pulmonary embolism (HCC) 05/05/2020  . Macrocytic anemia   . Hypokalemia   . Labile blood pressure   . Drug-induced hypotension   . History of hypertension   . First degree AV block   . Sleep disturbance   . Drug induced constipation   . Stage 2 chronic kidney disease   . Dysphagia, post-stroke   . Benign essential HTN   . Slow transit constipation   . 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 Allergic reaction caused by a drug (tPA w/ lip swelling) 02/21/2020  . 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 Dementia (HCC) 02/21/2020  . Bradycardia 02/21/2020  . AKI (acute kidney injury) (HCC) 02/21/2020  . 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 Dyslipidemia   . History of DVT (deep vein thrombosis)   . OSA (obstructive sleep apnea)   . Leukocytosis   . Stroke [facility])  L>R pontine and L cerebellar embolic infarcts, source unknown s/p tPA 02/16/2020  . Hx-TIA (transient ischemic attack)   . Hyperlipemia   . Hypertension   . Sleep apnea   . History of pulmonary embolus (PE)   . NSTEMI (non-ST elevated myocardial infarction) (HCC) 09/16/2019  . 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 Depression 07/29/2018  . Acute massive pulmonary embolism (HCC) 09/29/2016  . Acute encephalopathy 09/28/2016  . Left knee pain 09/28/2016  . Right leg swelling 09/28/2016  . Hilar mass 09/28/2016  . OSA on CPAP 09/28/2016  . HTN (hypertension) 09/28/2016  . Chronic pain 09/28/2016  . Cellulitis of left leg 06/14/2016  . Osteoarthritis of left knee  05/17/2016  . 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 ARTHROSCOPIC REPAIR ACL  02/03/2004  . HARDWARE REMOVAL N/A 07/27/2020   Procedure: Lumbar Hardware removal with lumbar wound debridement;  Surgeon: Barnett Abu, MD;  Location: [facility] OR;  Service: Neurosurgery;  Laterality: N/A;  . IR IVC FILTER PLMT / S&I Lenise Arena GUID/MOD SED  05/10/2020  . LEFT HEART CATH AND CORONARY ANGIOGRAPHY N/A 09/16/2019   Procedure: LEFT HEART CATH AND CORONARY ANGIOGRAPHY;  Surgeon: Yvonne Kendall, MD;  Location: [facility] INVASIVE CV LAB;  Service: Cardiovascular;  Laterality: N/A;  . LOOP RECORDER INSERTION N/A 02/21/2020   Procedure: LOOP RECORDER INSERTION;  Surgeon: Duke Salvia, MD;  Location: [facility] INVASIVE CV LAB;  Service: Cardiovascular;  Laterality: N/A;  . LUMBAR WOUND DEBRIDEMENT N/A 07/27/2020   Procedure: LUMBAR WOUND DEBRIDEMENT;  Surgeon: Barnett Abu, MD;  Location: [facility] OR;  Service: Neurosurgery;  Laterality: N/A;  . OOPHORECTOMY  1970  . SPINE SURGERY  03/31/2006  . TOTAL KNEE ARTHROPLASTY Left 05/17/2016   Procedure: LEFT TOTAL KNEE ARTHROPLASTY;  Surgeon: Kathryne Hitch, MD;  Location: [facility] ORS;  Service: Orthopedics;  Laterality: Left;  Adductor Block; Spinal to General  . TUBAL LIGATION  1983     OB History   No obstetric history on file.     Family History  Problem Relation Age of Onset  . Other Father        kidney problems    Social History   Tobacco Use  . Smoking status: Former Smoker    Packs/day: 1.00    Years: 10.00    Pack years: 10.00    Types: Cigarettes    Quit date: 08/12/1973    Years since quitting: 47.2  . Smokeless tobacco: Never Used  Vaping Use  . Vaping Use: Never used  Substance Use Topics  . Alcohol use: No  . Drug use: No    Home Medications Prior to Admission medications   Medication Sig Start Date End Date Taking? Authorizing Provider  acetaminophen (TYLENOL) 325 MG tablet Take  2 tablets (650 mg total) by mouth every 4 (four) hours as needed for mild pain or moderate pain (or Fever >/= 101). 08/06/20   Lewie Chamber, MD  busPIRone (BUSPAR) 15 MG tablet Take 1 tablet (15 mg total) by mouth 2 (two) times daily. 08/06/20   Lewie Chamber, MD  cycloSPORINE (RESTASIS) 0.05 % ophthalmic emulsion Place 1 drop into both eyes daily as needed (dry eyes). 02/21/20   Layne Benton, NP  donepezil (ARICEPT) 10 MG tablet Take 1 tablet (10 mg total) by mouth at bedtime. 03/09/20   Angiulli, Mcarthur Rossetti, PA-C  feeding supplement (ENSURE ENLIVE / ENSURE PLUS) LIQD Take 237 mLs by mouth 3 (three) times daily  between meals. 05/30/20   Swayze, Ava, DO  ferrous sulfate 325 (65 FE) MG tablet Take 1 tablet (325 mg total) by mouth daily with breakfast. 08/06/20   Lewie Chamber, MD  LINZESS 145 MCG CAPS capsule Take 145 mcg by mouth daily. 04/16/20   [provider]  memantine (NAMENDA XR) 28 MG CP24 24 hr capsule Take 1 capsule (28 mg total) by mouth daily. Patient taking differently: Take 28 mg by mouth at bedtime. 03/09/20   Angiulli, Mcarthur Rossetti, PA-C  Multiple Vitamins-Minerals (MULTI-VITAMIN GUMMIES PO) Take 1 each by mouth daily.     [provider]  nitroGLYCERIN (NITROSTAT) 0.4 MG SL tablet Place 1 tablet (0.4 mg total) under the tongue every 5 (five) minutes as needed for chest pain. 09/17/19   Duke, Roe Rutherford, PA  polyethylene glycol (MIRALAX / GLYCOLAX) 17 g packet Take 17 g by mouth 2 (two) times daily. Patient taking differently: Take 17 g by mouth daily as needed for mild constipation. 03/09/20   Angiulli, Mcarthur Rossetti, PA-C  senna-docusate (SENOKOT-S) 8.6-50 MG tablet Take 1 tablet by mouth 2 (two) times daily. 08/06/20   Lewie Chamber, MD    Allergies    T-pa [alteplase] and Codeine  Review of Systems   Review of Systems  All other systems reviewed and are negative.   Physical Exam Updated Vital Signs BP (!) 155/76 (BP Location: Left Arm)   Pulse 70   Temp (!)  97.5 F (36.4 C) (Oral)   Resp 18   SpO2 91%   Physical Exam Vitals and nursing note reviewed.  Constitutional:      General: She is not in acute distress.    Appearance: She is well-developed.     Comments: Cachectic appearing elderly female lying bed appears to be in no acute discomfort.  HENT:     Head: Atraumatic.  Eyes:     Conjunctiva/sclera: Conjunctivae normal.  Cardiovascular:     Rate and Rhythm: Normal rate and regular rhythm.     Pulses: Normal pulses.     Heart sounds: Normal heart sounds.  Pulmonary:     Effort: Pulmonary effort is normal.     Breath sounds: Normal breath sounds.  Abdominal:     Palpations: Abdomen is soft.     Tenderness: There is no abdominal tenderness.  Genitourinary:    CommentsMyriam Jacobson, NT, available to chaperone. 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  There is 1 wound towards left lumbosacral region that is oozing some purulent discharge with strong foul odor.  Increased tenderness to palpation of this affected area.  Minimal surrounding skin erythema.  No obvious fluctuance appreciated. Musculoskeletal:     Cervical back: Neck supple.     Comments: Minimal movement noted to right upper right lower and left lower extremity.  No movement to left upper extremity.  Skin:    Findings: No rash.  Neurological:     Mental Status: She is alert. Mental status is at baseline.  Psychiatric:        Mood and Affect: Mood normal.     ED Results / Procedures / Treatments   Labs (all labs ordered are listed, but only abnormal results are displayed) Labs Reviewed  CBC WITH DIFFERENTIAL/PLATELET - Abnormal; Notable for the following components:      Result Value   WBC 11.2 (*)    RBC 3.75 (*)    Hemoglobin 11.7 (*)    MCV  107.2 (*)    MCHC 29.1 (*)    RDW 18.7 (*)    Platelets 121 (*)    nRBC 1.3 (*)    Neutro Abs 8.4 (*)    All other components within normal  limits  C-REACTIVE PROTEIN - Abnormal; Notable for the following components:   CRP 1.6 (*)    All other components within normal limits  SEDIMENTATION RATE - Abnormal; Notable for the following components:   Sed Rate 61 (*)    All other components within normal limits  BASIC METABOLIC PANEL - Abnormal; Notable for the following components:   Sodium 161 (*)    Potassium 3.1 (*)    Chloride 121 (*)    Glucose, Bld 104 (*)    All other components within normal limits  I-STAT CHEM 8, ED - Abnormal; Notable for the following components:   Sodium 161 (*)    Potassium 3.0 (*)    Chloride 118 (*)    All other components within normal limits  CULTURE, BLOOD (ROUTINE X 2)  CULTURE, BLOOD (ROUTINE X 2)  SARS CORONAVIRUS 2 (TAT 6-24 HRS)  LACTIC ACID, PLASMA  LACTIC ACID, PLASMA  URINALYSIS, ROUTINE W REFLEX MICROSCOPIC    EKG None  Radiology CT ABDOMEN PELVIS W CONTRAST  Result Date: 10/19/2020 CLINICAL DATA:  Abdominal abscess/infection. Drainage from back wounds. EXAM: CT ABDOMEN AND PELVIS WITH CONTRAST TECHNIQUE: Multidetector CT imaging of the abdomen and pelvis was performed using the standard protocol following bolus administration of intravenous contrast. CONTRAST:  OMNIPAQUE IOHEXOL 300 MG/ML  SOLN COMPARISON:  07/21/2020 FINDINGS: Lower chest: Mild basilar scarring.  No active lower chest disease. Hepatobiliary: No liver parenchymal lesion. Chronic intra and extrahepatic biliary ductal dilatation which may be becoming more prominent over time. The common duct appears to be dilated all the way to the ampulla. No calcified gallstones are seen. The gallbladder is distended. Pancreas: No pancreatic parenchymal lesion is seen. Pancreatic duct is mildly dilated. Spleen: Negative Adrenals/Urinary Tract: No adrenal or renal lesion. Bladder is unremarkable. Stomach/Bowel: Moderate amount of fecal matter in the colon. No other bowel finding. Vascular/Lymphatic: Aortic atherosclerosis. No  aneurysm. IVC is normal. No adenopathy. Reproductive: Uterine leiomyomas.  No significant pelvic mass. Other: No free fluid or air. 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 No evidence of significant soft tissue abscess collection. See results of lumbar CT. IMPRESSION: 1. 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 No evidence of significant soft tissue abscess collection. See results of lumbar CT. 2. Chronic intra and extrahepatic biliary ductal dilatation which may be becoming more prominent over time. The common duct appears to be dilated all the way to the ampulla. No calcified gallstones are seen. The gallbladder is distended. Consider ultrasound and/or ERCP if there is clinical evidence of biliary obstruction. 3. Aortic atherosclerosis. 4. Uterine leiomyomas. 5. Moderate amount of fecal matter in the colon. Aortic Atherosclerosis (ICD10-I70.0).  A Electronically Signed   By: Paulina Fusi M.D.   On: 10/19/2020 18:24   CT L-SPINE NO CHARGE  Result Date: 10/19/2020 CLINICAL DATA:  Decubitus ulcer.  In EXAM: CT LUMBAR SPINE WITHOUT CONTRAST TECHNIQUE: Multidetector CT imaging of the lumbar spine was performed without intravenous contrast administration. Multiplanar CT image reconstructions were also generated. COMPARISON:  CT abdomen same day.  Previous lumbar radiographs. FINDINGS: Segmentation: 5 lumbar type vertebral bodies. Alignment: Thoracolumbar  curvature convex to the right and lower lumbar curvature convex to the left. Vertebrae: There is been previous fusion from L1 to the sacrum, with hardware removal. Screw shadows remain visible in the pedicles. There appears to be solid union throughout that segment. There are hypertrophic sclerotic changes of the posterior elements at T11, T12 and L1 which could relate to chronic degenerative change,  but the possibility of chronic osteomyelitis in that region is not excluded. There is no gross destructive bone change. The posterior elements at T11, T12 and L1 are immediately at the area of the decubitus ulcer. Increased kyphotic curvature in that region probably places the overlying soft tissue at risk. There are old minor compression deformities at T11 and T12 Paraspinal and other soft tissues: No evidence of para spinous soft tissue abscess. Disc levels: As noted above, distant fusion through the lumbosacral region with sufficient patency of the canal. IMPRESSION: 1. Previous fusion from L1 to the sacrum with hardware removal. There appears to be solid union throughout that segment. 2. Hypertrophic sclerotic changes of the posterior elements at T11, T12 and L1 which could relate to chronic degenerative change, but the possibility of chronic osteomyelitis in that region is not excluded. There is no gross destructive bone change. There is increased kyphotic curvature in that region which probably places the overlying soft tissue at risk. There is no evidence of para spinous soft tissue abscess. The posterior elements of T11, T12 and L1 are closely related to the area of decubitus ulcer. Electronically Signed   By: Paulina Fusi M.D.   On: 10/19/2020 18:31    Procedures .Critical Care Performed by: Fayrene Helper, PA-C Authorized by: Fayrene Helper, PA-C   Critical care provider statement:    Critical care time (minutes):  45   Critical care was time spent personally by me on the following activities:  Discussions with consultants, evaluation of patient's response to treatment, examination of patient, ordering and performing treatments and interventions, ordering and review of laboratory studies, ordering and review of radiographic studies, pulse oximetry, re-evaluation of patient's condition, obtaining history from patient or surrogate and review of old charts     Medications Ordered in ED Medications   dextrose 5 %-0.45 % sodium chloride infusion (has no administration in time range)  potassium chloride 10 mEq in 100 mL IVPB (has no administration in time range)  morphine 4 MG/ML injection 4 mg (4 mg Intravenous Given 10/19/20 1716)  ceFAZolin (ANCEF) IVPB 1 g/50 mL premix (1 g Intravenous New Bag/Given 10/19/20 1716)  iohexol (OMNIPAQUE) 300 MG/ML solution 100 mL (100 mLs Intravenous Contrast Given 10/19/20 1754)    ED Course  I have reviewed the triage vital signs and the nursing notes.  Pertinent labs & imaging results that were available during my care of the patient were reviewed by me and considered in my medical decision making (see chart for details).    MDM Rules/Calculators/A&P                          BP (!) 155/76 (BP Location: Left Arm)   Pulse 70   Temp (!) 97.5 F (36.4 C) (Oral)   Resp 18   SpO2 91%   Final Clinical Impression(s) / ED Diagnoses Final diagnoses:  Pressure ulcer  Infected decubitus ulcer, unspecified ulcer stage  Hypernatremia  Hypokalemia    Rx / DC Orders ED Discharge Orders    None     3:58 PM Patient here for  evaluation of pressure sores on her back.  It appears she has several pressure ulceration along the thoracic and lumbar spine with 1 ulceration oozing out some purulent discharge along with strong foul odor concerning for infection.  She is at risk for osteomyelitis, will obtain advanced imaging as well as labs for further evaluation.   I had the opportunity to talk to patient's power of attorney, her 2 sons, who report patient has had chronic pressure ulcers however he noticed strong foul order as well as purulent discharge to left buttock wound for the past 2 days which concerns him.  Furthermore for the past several weeks she is eating less and less and can only tolerate his baby food.  He feels she is deconditioned and the wound is not adequately healed due to poor p.o. intake.  He voiced a desire to have a feeding tube placed if  appropriate.  He feels she needs to be admitted for her wound infection.  We will initiate work-up, obtain appropriate imaging and will provide antibiotic.  From previous notes, patient was admitted in December of last year for similar presentation.  At that time she was given vancomycin and cefepime however ID specialist recommend transition to Cefazolin instead.  Therefore, will initiate cefazolin antibiotic in the ER.  Care discussed with DR. Dykstra.   5:14 PM Labs demonstrate a elevated sodium of 161, potassium is 3.0 chloride is 118.  Given such elevated sodium level, will obtain a BMP for confirmation.  6:44 PM Repeat BMP did confirm hyponatremia with a sodium of 161.  Potassium remains low at 3.1.  Appreciate consultation from [facility] hospitalist, Dr. Toniann FailKakrakandy who agrees to admit patient for further managements of her hyponatremic state as well as an infected pressure ulcer.  He recommends starting patient on one half normal saline with D5 IV fluid.   Fayrene Helperran, Haze Antillon, PA-C 10/19/20 1914    Milagros Lollykstra, Richard S, MD 10/20/20 [phone]

## 2020-10-19 NOTE — H&P (Signed)
History and Physical    [name]:[id number] DOB: Apr 07, 1941 DOA: 10/19/2020  PCP: [name], MD  Patient coming from: Home.  Chief Complaint: Discharge from the decubitus ulcer.  History obtained from ER physician.  Unable to reach patient's son.  HPI: [name] is a 80 y.o. 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  Not sure how long these discharges were lasting.  Unable to reach patient's son for further history.  Patient is having dementia and unable to provide good history.  ED Course: In the ER patient was afebrile with CT abdomen pelvis and CT lumbar spine done showing chronic degenerative changes versus chronic osteomyelitic changes in T11-12 and L1 area.  Patient also has decubitus ulcers on the left buttock area gluteal cleft with some discharge.  Labs are significant for sodium of 161 potassium of 3 platelets of 121 CRP 1.6 WBC 11.2 Covid test pending.  Patient was started on empiric antibiotics admitted for further management of infected decubitus ulcers with possible osteomyelitis and also hypernatremia.  Review of Systems: As per HPI, rest all negative.   Past Medical History:  Diagnosis Date  . Arthritis   . Asthma   . Chronic back pain   . Depression   . History of pulmonary embolus (PE)   . Hx-TIA (transient ischemic attack)   . Hyperlipemia   . Hypertension   . Personal history of DVT (deep vein thrombosis)   . Scoliosis   . Sleep apnea    cpap - settingsi at 2   . Stroke (HCC)   . 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 ARTHROSCOPIC REPAIR ACL  02/03/2004   . HARDWARE REMOVAL N/A 07/27/2020   Procedure: Lumbar Hardware removal with lumbar wound debridement;  Surgeon: Barnett Abu, MD;  Location: [facility] OR;  Service: Neurosurgery;  Laterality: N/A;  . IR IVC FILTER PLMT / S&I Lenise Arena GUID/MOD SED  05/10/2020  . LEFT HEART CATH AND CORONARY ANGIOGRAPHY N/A 09/16/2019   Procedure: LEFT HEART CATH AND CORONARY ANGIOGRAPHY;  Surgeon: Yvonne Kendall, MD;  Location: [facility] INVASIVE CV LAB;  Service: Cardiovascular;  Laterality: N/A;  . LOOP RECORDER INSERTION N/A 02/21/2020   Procedure: LOOP RECORDER INSERTION;  Surgeon: Duke Salvia, MD;  Location: [facility] INVASIVE CV LAB;  Service: Cardiovascular;  Laterality: N/A;  . LUMBAR WOUND DEBRIDEMENT N/A 07/27/2020   Procedure: LUMBAR WOUND DEBRIDEMENT;  Surgeon: Barnett Abu, MD;  Location: [facility] OR;  Service: Neurosurgery;  Laterality: N/A;  . OOPHORECTOMY  1970  . SPINE SURGERY  03/31/2006  . TOTAL KNEE ARTHROPLASTY Left 05/17/2016   Procedure: LEFT TOTAL KNEE ARTHROPLASTY;  Surgeon: Kathryne Hitch, MD;  Location: [facility] ORS;  Service: Orthopedics;  Laterality: Left;  Adductor Block; Spinal to General  . TUBAL LIGATION  1983     reports that she quit smoking about 47 years ago. Her smoking use included cigarettes. She has a 10.00 pack-year smoking history. She has never used smokeless tobacco. She reports that she does not drink alcohol and does not use drugs.  Allergies  Allergen Reactions  . T-Pa [Alteplase] Swelling    Does okay with it if premedicated with benadryl.  Marland Kitchen  Codeine Itching    Family History  Problem Relation Age of Onset  . Other Father        kidney problems    Prior to Admission medications   Medication Sig Start Date End Date Taking? Authorizing Provider  amLODipine (NORVASC) 10 MG tablet Take 10 mg by mouth daily. 09/01/20  Yes [provider]  Ascorbic Acid (VITAMIN C) 1000 MG tablet Take 1,000 mg by mouth daily.   Yes [provider]  atorvastatin (LIPITOR) 20 MG  tablet Take 20 mg by mouth every evening. 09/15/20  Yes [provider]  busPIRone (BUSPAR) 30 MG tablet Take 15 mg by mouth 2 (two) times daily. 09/22/20  Yes [provider]  cholecalciferol (VITAMIN D3) 25 MCG (1000 UNIT) tablet Take 1,000 Units by mouth daily.   Yes [provider]  donepezil (ARICEPT) 10 MG tablet Take 1 tablet (10 mg total) by mouth at bedtime. 03/09/20  Yes Angiulli, Mcarthur Rossetti, PA-C  dronabinol (MARINOL) 2.5 MG capsule Take 2.5 mg by mouth at bedtime.   Yes [provider]  ELIQUIS 5 MG TABS tablet Take 5 mg by mouth 2 (two) times daily. 09/26/20  Yes [provider]  feeding supplement (ENSURE ENLIVE / ENSURE PLUS) LIQD Take 237 mLs by mouth 3 (three) times daily between meals. 05/30/20  Yes Swayze, Ava, DO  ferrous sulfate 325 (65 FE) MG tablet Take 1 tablet (325 mg total) by mouth daily with breakfast. 08/06/20  Yes Lewie Chamber, MD  furosemide (LASIX) 40 MG tablet Take 40 mg by mouth daily. 09/19/20  Yes [provider]  LINZESS 145 MCG CAPS capsule Take 145 mcg by mouth daily. 04/16/20  Yes [provider]  lisinopril (ZESTRIL) 5 MG tablet Take 5 mg by mouth daily.   Yes [provider]  memantine (NAMENDA XR) 28 MG CP24 24 hr capsule Take 1 capsule (28 mg total) by mouth daily. Patient taking differently: Take 28 mg by mouth at bedtime. 03/09/20  Yes Angiulli, Mcarthur Rossetti, PA-C  Multiple Vitamins-Minerals (MULTI-VITAMIN GUMMIES PO) Take 1 each by mouth daily.    Yes [provider]  oxyCODONE (ROXICODONE) 15 MG immediate release tablet Take 7.5-15 mg by mouth 4 (four) times daily as needed for pain. 09/25/20  Yes [provider]  potassium chloride (KLOR-CON) 10 MEQ tablet Take 10 mEq by mouth daily. 09/19/20  Yes [provider]  acetaminophen (TYLENOL) 325 MG tablet Take 2 tablets (650 mg total) by mouth every 4 (four) hours as needed for mild pain or moderate pain (or Fever >/= 101).  08/06/20   Lewie Chamber, MD  cycloSPORINE (RESTASIS) 0.05 % ophthalmic emulsion Place 1 drop into both eyes daily as needed (dry eyes). 02/21/20   Layne Benton, NP  nitroGLYCERIN (NITROSTAT) 0.4 MG SL tablet Place 1 tablet (0.4 mg total) under the tongue every 5 (five) minutes as needed for chest pain. 09/17/19   Duke, Roe Rutherford, PA  polyethylene glycol (MIRALAX / GLYCOLAX) 17 g packet Take 17 g by mouth 2 (two) times daily. Patient not taking: Reported on 10/19/2020 03/09/20   Angiulli, Mcarthur Rossetti, PA-C  prochlorperazine (COMPAZINE) 10 MG tablet Take 10 mg by mouth every 6 (six) hours as needed for nausea or vomiting. 09/14/20   [provider]    Physical Exam: Constitutional: Moderately built and nourished. Vitals:   10/19/20 1851 10/19/20 1900 10/19/20 1930 10/19/20 2107  BP: 104/63 (!) 115/59 122/75 100/78  Pulse: 94 79 63 100  Resp: 18 18 18 20   Temp:  98.1 F (36.7 C)  97.9 F (36.6 C)  TempSrc:    Oral  SpO2: 92% 95% 95% 100%  Weight:    61.3 kg   Eyes: Anicteric no pallor. ENMT: No discharge from the ears eyes nose or mouth. Neck: No mass felt.  No neck rigidity. Respiratory: No rhonchi or crepitations. Cardiovascular: S1-S2 heard. Abdomen: Soft nontender bowel sounds present. Musculoskeletal: No edema. Skin: Multiple decubitus ulcers involving the thoracolumbar area left gluteal area and gluteal cleft area stage IV.  With discharge. Neurologic: Alert awake oriented to name and place.  Moving all extremities but generally weak. Psychiatric: Oriented to name and place.   Labs on Admission: I have personally reviewed following labs and imaging studies  CBC: Recent Labs  Lab 10/19/20 1611 10/19/20 1711  WBC 11.2*  --   NEUTROABS 8.4*  --   HGB 11.7* 12.2  HCT 40.2 36.0  MCV 107.2*  --   PLT 121*  --    Basic Metabolic Panel: Recent Labs  Lab 10/19/20 1711 10/19/20 1714  NA 161* 161*  K 3.0* 3.1*  CL 118* 121*  CO2  --  30  GLUCOSE 99 104*  BUN  15 16  CREATININE 0.50 0.58  CALCIUM  --  9.7   GFR: Estimated Creatinine Clearance: 48.3 mL/min (by C-G formula based on SCr of 0.58 mg/dL). Liver Function Tests: No results for input(s): AST, ALT, ALKPHOS, BILITOT, PROT, ALBUMIN in the last 168 hours. No results for input(s): LIPASE, AMYLASE in the last 168 hours. No results for input(s): AMMONIA in the last 168 hours. Coagulation Profile: No results for input(s): INR, PROTIME in the last 168 hours. Cardiac Enzymes: No results for input(s): CKTOTAL, CKMB, CKMBINDEX, TROPONINI in the last 168 hours. BNP (last 3 results) No results for input(s): PROBNP in the last 8760 hours. HbA1C: No results for input(s): HGBA1C in the last 72 hours. CBG: No results for input(s): GLUCAP in the last 168 hours. Lipid Profile: No results for input(s): CHOL, HDL, LDLCALC, TRIG, CHOLHDL, LDLDIRECT in the last 72 hours. Thyroid Function Tests: No results for input(s): TSH, T4TOTAL, FREET4, T3FREE, THYROIDAB in the last 72 hours. Anemia Panel: No results for input(s): VITAMINB12, FOLATE, FERRITIN, TIBC, IRON, RETICCTPCT in the last 72 hours. Urine analysis:    Component Value Date/Time   COLORURINE STRAW (A) 10/19/2020 1942   APPEARANCEUR CLEAR 10/19/2020 1942   LABSPEC 1.021 10/19/2020 1942   PHURINE 9.0 (H) 10/19/2020 1942   GLUCOSEU NEGATIVE 10/19/2020 1942   HGBUR NEGATIVE 10/19/2020 1942   BILIRUBINUR NEGATIVE 10/19/2020 1942   KETONESUR NEGATIVE 10/19/2020 1942   PROTEINUR NEGATIVE 10/19/2020 1942   UROBILINOGEN 1.0 02/14/2012 1054   NITRITE NEGATIVE 10/19/2020 1942   LEUKOCYTESUR NEGATIVE 10/19/2020 1942   Sepsis Labs: @LABRCNTIP (procalcitonin:4,lacticidven:4) )No results found for this or any previous visit (from the past 240 hour(s)).   Radiological Exams on Admission: CT ABDOMEN PELVIS W CONTRAST  Result Date: 10/19/2020 CLINICAL DATA:  Abdominal abscess/infection. Drainage from back wounds. EXAM: CT ABDOMEN AND PELVIS WITH  CONTRAST TECHNIQUE: Multidetector CT imaging of the abdomen and pelvis was performed using the standard protocol following bolus administration of intravenous contrast. CONTRAST:  OMNIPAQUE IOHEXOL 300 MG/ML  SOLN COMPARISON:  07/21/2020 FINDINGS: Lower chest: Mild basilar scarring.  No active lower chest disease. Hepatobiliary: No liver parenchymal lesion. Chronic intra and extrahepatic biliary ductal dilatation which may be becoming more prominent over time. The common duct appears to be dilated  all the way to the ampulla. No calcified gallstones are seen. The gallbladder is distended. Pancreas: No pancreatic parenchymal lesion is seen. Pancreatic duct is mildly dilated. Spleen: Negative Adrenals/Urinary Tract: No adrenal or renal lesion. Bladder is unremarkable. Stomach/Bowel: Moderate amount of fecal matter in the colon. No other bowel finding. Vascular/Lymphatic: Aortic atherosclerosis. No aneurysm. IVC is normal. No adenopathy. Reproductive: Uterine leiomyomas.  No significant pelvic mass. Other: No free fluid or air. 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 No evidence of significant soft tissue abscess collection. See results of lumbar CT. IMPRESSION: 1. 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 No evidence of significant soft tissue abscess collection. See results of lumbar CT. 2. Chronic intra and extrahepatic biliary ductal dilatation which may be becoming more prominent over time. The common duct appears to be dilated all the way to the ampulla. No calcified gallstones are seen. The gallbladder is distended. Consider ultrasound and/or ERCP if there is clinical evidence of biliary obstruction. 3. Aortic atherosclerosis. 4. Uterine leiomyomas. 5. Moderate amount of fecal matter in the colon. Aortic Atherosclerosis (ICD10-I70.0).  A  Electronically Signed   By: Paulina FusiMark  Shogry M.D.   On: 10/19/2020 18:24   CT L-SPINE NO CHARGE  Result Date: 10/19/2020 CLINICAL DATA:  Decubitus ulcer.  In EXAM: CT LUMBAR SPINE WITHOUT CONTRAST TECHNIQUE: Multidetector CT imaging of the lumbar spine was performed without intravenous contrast administration. Multiplanar CT image reconstructions were also generated. COMPARISON:  CT abdomen same day.  Previous lumbar radiographs. FINDINGS: Segmentation: 5 lumbar type vertebral bodies. Alignment: Thoracolumbar curvature convex to the right and lower lumbar curvature convex to the left. Vertebrae: There is been previous fusion from L1 to the sacrum, with hardware removal. Screw shadows remain visible in the pedicles. There appears to be solid union throughout that segment. There are hypertrophic sclerotic changes of the posterior elements at T11, T12 and L1 which could relate to chronic degenerative change, but the possibility of chronic osteomyelitis in that region is not excluded. There is no gross destructive bone change. The posterior elements at T11, T12 and L1 are immediately at the area of the decubitus ulcer. Increased kyphotic curvature in that region probably places the overlying soft tissue at risk. There are old minor compression deformities at T11 and T12 Paraspinal and other soft tissues: No evidence of para spinous soft tissue abscess. Disc levels: As noted above, distant fusion through the lumbosacral region with sufficient patency of the canal. IMPRESSION: 1. Previous fusion from L1 to the sacrum with hardware removal. There appears to be solid union throughout that segment. 2. Hypertrophic sclerotic changes of the posterior elements at T11, T12 and L1 which could relate to chronic degenerative change, but the possibility of chronic osteomyelitis in that region is not excluded. There is no gross destructive bone change. There is increased kyphotic curvature in that region which probably places the  overlying soft tissue at risk. There is no evidence of para spinous soft tissue abscess. The posterior elements of T11, T12 and L1 are closely related to the area of decubitus ulcer. Electronically Signed   By: Paulina FusiMark  Shogry M.D.   On: 10/19/2020 18:31     Assessment/Plan Principal Problem:   Pressure injury of skin with infection Active Problems:   HTN (hypertension)   Takotsubo cardiomyopathy   OSA (obstructive sleep apnea)   Cellulitis   Hypernatremia  1. Infected multiple decubitus ulcer with concerning features for possible osteomyelitis in the T11-12 and L1 area was admitted in December 2021 at that time hardware was removed and was on antibiotics until September 08, 2020.  Patient has been started on empiric antibiotics.  We will get wound team consult.  May consult orthopedic surgery in the morning for further recommendations. 2. Severe could be from poor oral intake.  Will need to get further history from patient's son when available.  At this time I have placed patient on D5W. 3. Hypertension on amlodipine and lisinopril. 4. Hyperlipidemia on statins. 5. Thrombocytopenia appears to be new.  Follow CBC. 6. Anemia on iron supplements. 7. Dementia on Aricept and Namenda. 8. History of cardiomyopathy last EF measured in September 2021 was 60 to 65%.  Presently receiving fluids. 9. History of DVT and pulmonary embolism on apixaban.  Since patient has multiple decubitus ulcers with infection and severe hyponatremia will need close monitoring and inpatient status.   DVT prophylaxis: Apixaban. Code Status: Partial code. Family Communication: Unable to reach patient's son. Disposition Plan: To be determined. Consults called: Wound team. Admission status: Inpatient.   Eduard Clos MD [facility] Pager [phone].  If 7PM-7AM, please contact night-coverage [email or web address] Password [facility]  10/19/2020, 10:09 PM

## 2020-10-19 NOTE — ED Triage Notes (Signed)
Pt BIB EMS from home. Family wants pt back wounds evaluated, c/o drainage. Pt is bed bound. Pt son gave pt oxycodone before EMS was on scene.   122/78 56 HR 90% RA with EMS 18 Resp

## 2020-10-19 NOTE — ED Notes (Signed)
Test: Sodium Critical Value: 161  Dykstra, EDP notified

## 2020-10-19 NOTE — Progress Notes (Signed)
Date and time results received: 10/19/20  2250  Test: Lactic Acid Critical Value: 2.7  Name of Provider Notified: Arvilla Market MD

## 2020-10-20 DIAGNOSIS — E876 Hypokalemia: Secondary | ICD-10-CM

## 2020-10-20 DIAGNOSIS — I10 Essential (primary) hypertension: Secondary | ICD-10-CM | POA: Diagnosis not present

## 2020-10-20 DIAGNOSIS — E870 Hyperosmolality and hypernatremia: Secondary | ICD-10-CM | POA: Diagnosis not present

## 2020-10-20 DIAGNOSIS — L8994 Pressure ulcer of unspecified site, stage 4: Secondary | ICD-10-CM | POA: Diagnosis not present

## 2020-10-20 LAB — BASIC METABOLIC PANEL
Anion gap: 10 (ref 5–15)
Anion gap: 7 (ref 5–15)
Anion gap: 9 (ref 5–15)
BUN: 15 mg/dL (ref 8–23)
BUN: 13 mg/dL (ref 8–23)
BUN: 12 mg/dL (ref 8–23)
CO2: 27 mmol/L (ref 22–32)
CO2: 26 mmol/L (ref 22–32)
CO2: 26 mmol/L (ref 22–32)
Calcium: 8.6 mg/dL — ABNORMAL LOW (ref 8.9–10.3)
Calcium: 8.8 mg/dL — ABNORMAL LOW (ref 8.9–10.3)
Calcium: 8.2 mg/dL — ABNORMAL LOW (ref 8.9–10.3)
Chloride: 117 mmol/L — ABNORMAL HIGH (ref 98–111)
Chloride: 117 mmol/L — ABNORMAL HIGH (ref 98–111)
Chloride: 112 mmol/L — ABNORMAL HIGH (ref 98–111)
Creatinine, Ser: 0.47 mg/dL (ref 0.44–1.00)
Creatinine, Ser: 0.47 mg/dL (ref 0.44–1.00)
Creatinine, Ser: 0.48 mg/dL (ref 0.44–1.00)
GFR, Estimated: >60 mL/min (ref >60)
GFR, Estimated: >60 mL/min (ref >60)
GFR, Estimated: >60 mL/min (ref >60)
Glucose, Bld: 136 mg/dL — ABNORMAL HIGH (ref 70–99)
Glucose, Bld: 184 mg/dL — ABNORMAL HIGH (ref 70–99)
Glucose, Bld: 95 mg/dL (ref 70–99)
Potassium: 3.4 mmol/L — ABNORMAL LOW (ref 3.5–5.1)
Potassium: 3 mmol/L — ABNORMAL LOW (ref 3.5–5.1)
Potassium: 4 mmol/L (ref 3.5–5.1)
Sodium: 154 mmol/L — ABNORMAL HIGH (ref 135–145)
Sodium: 150 mmol/L — ABNORMAL HIGH (ref 135–145)
Sodium: 147 mmol/L — ABNORMAL HIGH (ref 135–145)

## 2020-10-20 LAB — SARS CORONAVIRUS 2 (TAT 6-24 HRS): SARS Coronavirus 2: NEGATIVE

## 2020-10-20 LAB — LACTIC ACID, PLASMA
Lactic Acid, Venous: 1.9 mmol/L (ref 0.5–1.9)
Lactic Acid, Venous: 2 mmol/L (ref 0.5–1.9)

## 2020-10-20 LAB — PHOSPHORUS: Phosphorus: 2 mg/dL — ABNORMAL LOW (ref 2.5–4.6)

## 2020-10-20 LAB — MAGNESIUM: Magnesium: 2.2 mg/dL (ref 1.7–2.4)

## 2020-10-20 MED ORDER — POTASSIUM CHLORIDE 10 MEQ/100ML IV SOLN
10.0000 meq | INTRAVENOUS | Status: AC
  Administered 2020-10-20 (×3): 10 meq via INTRAVENOUS
  Filled 2020-10-20: qty 100

## 2020-10-20 MED ORDER — POTASSIUM PHOSPHATES 15 MMOLE/5ML IV SOLN
30.0000 mmol | Freq: Once | INTRAVENOUS | Status: AC
  Administered 2020-10-20: 30 mmol via INTRAVENOUS
  Filled 2020-10-20: qty 10

## 2020-10-20 MED ORDER — LACTATED RINGERS IV BOLUS
1000.0000 mL | Freq: Once | INTRAVENOUS | Status: AC
  Administered 2020-10-20: 1000 mL via INTRAVENOUS

## 2020-10-20 MED ORDER — COLLAGENASE 250 UNIT/GM EX OINT
TOPICAL_OINTMENT | Freq: Every day | CUTANEOUS | Status: AC
  Filled 2020-10-20 (×3): qty 30

## 2020-10-20 NOTE — Evaluation (Signed)
Clinical/Bedside Swallow Evaluation Patient Details  Name: [name] MRN: [id number] Date of Birth: 1940/11/01  Today's Date: 10/20/2020 Time: SLP Start Time (ACUTE ONLY): 1130 SLP Stop Time (ACUTE ONLY): 1155 SLP Time Calculation (min) (ACUTE ONLY): 25 min  Past Medical History:  Past Medical History:  Diagnosis Date  . Arthritis   . Asthma   . Chronic back pain   . Depression   . History of pulmonary embolus (PE)   . Hx-TIA (transient ischemic attack)   . Hyperlipemia   . Hypertension   . Personal history of DVT (deep vein thrombosis)   . Scoliosis   . Sleep apnea    cpap - settingsi at 2   . Stroke (HCC)   . 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 ARTHROSCOPIC REPAIR ACL  02/03/2004  . HARDWARE REMOVAL N/A 07/27/2020   Procedure: Lumbar Hardware removal with lumbar wound debridement;  Surgeon: Barnett Abu, MD;  Location: [facility] OR;  Service: Neurosurgery;  Laterality: N/A;  . IR IVC FILTER PLMT / S&I Lenise Arena GUID/MOD SED  05/10/2020  . LEFT HEART CATH AND CORONARY ANGIOGRAPHY N/A 09/16/2019   Procedure: LEFT HEART CATH AND CORONARY ANGIOGRAPHY;  Surgeon: Yvonne Kendall, MD;  Location: [facility] INVASIVE CV LAB;  Service: Cardiovascular;  Laterality: N/A;  . LOOP RECORDER INSERTION N/A 02/21/2020   Procedure: LOOP RECORDER INSERTION;  Surgeon: Duke Salvia, MD;  Location: [facility] INVASIVE CV LAB;  Service: Cardiovascular;  Laterality: N/A;  . LUMBAR WOUND DEBRIDEMENT N/A 07/27/2020   Procedure: LUMBAR WOUND DEBRIDEMENT;  Surgeon: Barnett Abu, MD;  Location: [facility] OR;  Service: Neurosurgery;  Laterality: N/A;  . OOPHORECTOMY  1970  . SPINE SURGERY  03/31/2006  . TOTAL KNEE ARTHROPLASTY Left 05/17/2016   Procedure: LEFT TOTAL KNEE ARTHROPLASTY;  Surgeon: Kathryne Hitch, MD;  Location: [facility] ORS;  Service: Orthopedics;  Laterality: Left;  Adductor Block; Spinal to General  . TUBAL LIGATION  1983   HPI:   80yo female admitted 10/19/20 due to discharge from decubitus ulcer. 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 Pt known to ST services from CIR stay after CVA in July 2021, and in October 2021. Previous swallow evaluation recommended Dys2 diet with thin liquids.  Assessment / Plan / Recommendation Clinical Impression  Pt seen at bedside for evaluation of swallow function and safety, and identification of least restrictive diet. Pt has marked left facial weakness, residual from CVA in 2021, however, her speech is intelligible, and no anterior leakage of food/liquid was noted. Pt accepted trials of thin liquid, puree, and solid textures. No overt s/s aspiration observed on any consistency given, however, pt has significant difficulty with oral manipulation and posterior propulsion of puree and solid textures, likely due to poor dentition and cognitive impairment. RN was present giving PO meds whole with sips of water. This was tolerated without overt difficulty. NT reported pt had had difficulty with eggs this morning - pt would chew, but was unable to initiate a swallow. Recommend full liquid diet/thin liquids with meds whole with liquid. Safe swallow precautions posted at [facility]. SLP will continue to follow to assess diet tolerance and provide ongoing education.   SLP Visit Diagnosis: Dysphagia, unspecified (R13.10)    Aspiration Risk  Mild aspiration risk;Moderate aspiration risk;Risk for inadequate nutrition/hydration    Diet Recommendation Thin liquid;Other (Comment) (full liquid)   Liquid Administration via: Straw;Cup Medication Administration: Whole meds with liquid Supervision: Staff to assist  with self feeding;Full supervision/cueing for compensatory strategies Compensations: Minimize environmental distractions;Slow rate;Small sips/bites Postural Changes: Seated upright at 90 degrees;Remain upright for at least 30 minutes after po intake    Other  Recommendations Oral Care  Recommendations: Oral care BID Other Recommendations: Have oral suction available;Other (Comment) (to facilitate thorough oral care given oral dysphagia)   Follow up Recommendations 24 hour supervision/assistance      Frequency and Duration min 1 x/week  2 weeks;1 week       Prognosis Prognosis for Safe Diet Advancement: Fair Barriers to Reach Goals: Cognitive deficits      Swallow Study   General Date of Onset: 10/19/20 HPI: 80yo female admitted 10/19/20 due to discharge from decubitus ulcer. 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 Pt known to ST services Type of Study: Bedside Swallow Evaluation Previous Swallow Assessment: October 2021 - dys 2/thin liquids Diet Prior to this Study: Regular;Thin liquids Temperature Spikes Noted: No Respiratory Status: Room air History of Recent Intubation: No Behavior/Cognition: Alert;Cooperative;Pleasant mood;Confused Oral Cavity Assessment: Within Functional Limits Oral Care Completed by SLP: No Oral Cavity - Dentition: Missing dentition (edentulous upper) Vision: Functional for self-feeding Self-Feeding Abilities: Total assist Patient Positioning: Upright in bed Baseline Vocal Quality: Normal Volitional Cough: Cognitively unable to elicit Volitional Swallow: Unable to elicit    Oral/Motor/Sensory Function Overall Oral Motor/Sensory Function: Moderate impairment Facial ROM: Reduced left Facial Symmetry: Abnormal symmetry left Facial Strength: Reduced left Lingual ROM: Other (Comment) (unable to fully assess, however, pt speech is intelligible.) Lingual Strength: Within Functional Limits Mandible: Within Functional Limits   Ice Chips Ice chips: Not tested   Thin Liquid Thin Liquid: Within functional limits Presentation: Straw    Nectar Thick Nectar Thick Liquid: Not tested   Honey Thick Honey Thick Liquid: Not tested   Puree Puree: Impaired Oral Phase Impairments: Reduced lingual movement/coordination;Impaired  mastication;Poor awareness of bolus Oral Phase Functional Implications: Prolonged oral transit;Oral residue;Oral holding   Solid     Solid: Not tested     Alishah Schulte B. Murvin Natal, [facility], CCC-SLP Speech Language Pathologist Office: [phone] Pager: [phone]  Leigh Aurora 10/20/2020,12:48 PM

## 2020-10-20 NOTE — Consult Note (Signed)
WOC Nurse Consult Note: Full thickness nonhealing surgical wound to lumbar spine.  History osteomyelitis with infected hardware. Slough covers entire wound bed at this time. Hardware was removed 01/22.    Reason for Consult:Unstageable pressure injuries to sacrum and left gluteal area.  Nonhealing surgical wound to spine.  Wound type:unstageable and nonhealing wounds Pressure Injury POA: Yes Measurement: Lumbar spine wound:  7 cm x 6 cm 100% slough.  Bony prominence protruding from this same area creates challenge with offloading.  Sacrococcygeal area:  3 nonintact lesions:  1 cm x 0.5 cm 100% slough Left gluteal wound:  3 cm x 4 cm 100% slough Wound bed:all are 100% devitalized tissue.  Drainage (amount, consistency, odor) MOderate tan effluent  Necrotic odor Periwound:Dry skin.  Frail in appearance with bony prominences along spine and trochanters Dressing procedure/placement/frequency:Cleanse wounds to spine and sacrum/left buttocks with NS and pat dry.  Apply Santyl to open wounds.  Cover wth NS moist gauze.  Secure with foam dressing.   Will order low air loss bed Will offload heels with PRevalon boots Will not follow at this time.  Please re-consult if needed.  Maple Hudson MSN, RN, FNP-BC CWON Wound, Ostomy, Continence Nurse Pager [phone]

## 2020-10-20 NOTE — Progress Notes (Signed)
Chaplain engaged in an initial visit with [name] and [name].  [name] shared how hard it has been to see his mom's wounds not heal.  Chaplain offered listening as [name] served as witness on the unit for another patient.    Chaplain will follow-up.    10/20/20 1500  Clinical Encounter Type  Visited With Patient and family together  Visit Type Initial

## 2020-10-20 NOTE — Progress Notes (Signed)
PROGRESS NOTE    [name]  NFA:[id number] DOB: 1940-09-04 DOA: 10/19/2020 PCP: [name], MD    Chief Complaint  Patient presents with  . 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  On further discussion with the patient's son he reports she takes minimal nutrition orally.  On arrival to ED she was found to be severely hyper natremic, and failure to thrive.  She was found to have multiple decubitus ulcers on the sacrum and left buttock area.  She was empirically started on IV antibiotics for possible infection of the decubitus ulcers. Wound care consulted for recommendations.  Assessment & Plan:   Principal Problem:   Pressure injury of skin with infection Active Problems:   HTN (hypertension)   Takotsubo cardiomyopathy   OSA (obstructive sleep apnea)   Cellulitis   Hypernatremia   Infected multiple decubitus ulcer  Empirically on IV antibiotics.  Care consulted and recommendations given. CT of the abdomen pelvis does not show any acute osteomyelitis at this time. 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 No evidence of significant soft tissue abscess collection. CT of the lumbar spine shows Hypertrophic sclerotic changes of the posterior elements at T11, T12 and L1 which could relate to chronic degenerative change, but the possibility of chronic osteomyelitis in that region is not excluded. There is no gross destructive bone change. There is increased kyphotic curvature in that region which probably places the overlying soft tissue at risk. There is no evidence of paraspinous soft tissue  abscess. The posterior elements of T11, T12 and L1 are closely related to the area of decubitus ulcer   Failure to thrive, poor oral intake, AKI and hyper natremia Secondary to poor oral intake and free water deficit Started the patient on IV fluids and monitor sodium levels.     Essential hypertension Blood pressure parameters appear to be optimal at this time. Continue to monitor.    Dementia No agitation On Aricept and Namenda.    Poor oral intake/dysphagia Patient son would like IR consult for PEG placement Consultation requested.    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   Hypokalemia replaced   Hypophosphatemia Replaced    Lactic acidosis Secondary to dehydration    Mild thrombocytopenia Unclear etiology, continue to monitor.  No signs of obvious bleeding                 DVT prophylaxis: eliquis Code Status: PARTIAL  Family Communication: discussed with son over the phone.  Disposition:   Status is: Inpatient  Remains inpatient appropriate because:Ongoing diagnostic testing needed not appropriate for outpatient work up, Unsafe d/c plan and IV treatments appropriate due to intensity of illness or inability to take PO   Dispo: The patient is from: Home              Anticipated d/c is to: Home              Patient currently is not medically stable to d/c.   Difficult to place patient No       Consultants:   IR  Palliative care.    Procedures: none.  Antimicrobials:  Antibiotics Given (last 72 hours)    Date/Time Action Medication Dose Rate   10/19/20 1716 New Bag/Given   ceFAZolin (ANCEF) IVPB 1 g/50 mL premix 1 g 100 mL/hr   10/19/20 2345 New Bag/Given   ceFEPIme (MAXIPIME) 2 g in sodium chloride 0.9 % 100 mL IVPB 2 g 200 mL/hr   10/20/20 0035 New Bag/Given   vancomycin (VANCOREADY) IVPB 1000 mg/200 mL 1,000 mg 200 mL/hr   10/20/20 1129 New Bag/Given   ceFEPIme (MAXIPIME) 2 g in sodium  chloride 0.9 % 100 mL IVPB 2 g 200 mL/hr        Subjective: No new complaints at this time  Objective: Vitals:   10/20/20 0104 10/20/20 0522 10/20/20 0940 10/20/20 1247  BP: (!) 81/51 (!) 91/51 (!) 92/52 (!) 141/73  Pulse: (!) 58 (!) 56 (!) 59 (!) 56  Resp: 14 14 16 16   Temp: 97.7 F (36.5 C) 98.1 F (36.7 C)  97.9 F (36.6 C)  TempSrc: Oral Oral  Axillary  SpO2: 95% 96% 93%   Weight:      Height:        Intake/Output Summary (Last 24 hours) at 10/20/2020 1515 Last data filed at 10/20/2020 0900 Gross per 24 hour  Intake 832.02 ml  Output 350 ml  Net 482.02 ml   Filed Weights   10/19/20 2107  Weight: 61.3 kg    Examination:  General exam: Appears calm and comfortable  Respiratory system: Clear to auscultation. Respiratory effort normal. Cardiovascular system: S1 & S2 heard, RRR. No JVD,  No pedal edema. Gastrointestinal system: Abdomen is nondistended, soft and nontender.. Normal bowel sounds heard. Central nervous system: Alert, does not follow commands Extremities: No cyanosis Skin: Multiple decubitus ulcers Psychiatry: Appropriate    Data Reviewed: I have personally reviewed following labs and imaging studies  CBC: Recent Labs  Lab 10/19/20 1611 10/19/20 1711  WBC 11.2*  --   NEUTROABS 8.4*  --   HGB 11.7* 12.2  HCT 40.2 36.0  MCV 107.2*  --   PLT 121*  --     Basic Metabolic Panel: Recent Labs  Lab 10/19/20 1711 10/19/20 1714 10/20/20 0509 10/20/20 0950  NA 161* 161* 154* 150*  K 3.0* 3.1* 3.4* 3.0*  CL 118* 121* 117* 117*  CO2  --  30 27 26   GLUCOSE 99 104* 136* 184*  BUN 15 16 15 13   CREATININE 0.50 0.58 0.47 0.47  CALCIUM  --  9.7 8.6* 8.8*  MG  --   --   --  2.2  PHOS  --   --   --  2.0*    GFR: Estimated Creatinine Clearance: 48.3 mL/min (by C-G formula based on SCr of 0.47 mg/dL).  Liver Function Tests: No results for input(s): AST, ALT, ALKPHOS, BILITOT, PROT, ALBUMIN in the last 168 hours.  CBG: No results for  input(s): GLUCAP in the last 168 hours.   Recent Results (from the past 240 hour(s))  Blood culture (routine x 2)     Status: None (Preliminary result)   Collection Time: 10/19/20  5:02 PM   Specimen: BLOOD LEFT FOREARM  Result Value Ref Range Status   Specimen Description   Final    BLOOD LEFT FOREARM Performed at [facility], [address] [place], [place]    Special Requests   Final    BOTTLES DRAWN AEROBIC AND ANAEROBIC Blood Culture results may not be optimal due to an excessive volume of blood received  in culture bottles Performed at [facility], [address] [name], [place] [address]    Culture   Final    NO GROWTH < 24 HOURS Performed at [facility], [address] [place], [place] [address]    Report Status PENDING  Incomplete  SARS CORONAVIRUS 2 (TAT 6-24 HRS) Nasopharyngeal Nasopharyngeal Swab     Status: None   Collection Time: 10/19/20  5:14 PM   Specimen: Nasopharyngeal Swab  Result Value Ref Range Status   SARS Coronavirus 2 NEGATIVE NEGATIVE Final    Comment: (NOTE) SARS-CoV-2 target nucleic acids are NOT DETECTED.  The SARS-CoV-2 RNA is generally detectable in upper and lower respiratory specimens during the acute phase of infection. Negative results do not preclude SARS-CoV-2 infection, do not rule out co-infections with other pathogens, and should not be used as the sole basis for treatment or other patient management decisions. Negative results must be combined with clinical observations, patient history, and epidemiological information. The expected result is Negative.  Fact Sheet for Patients: HairSlick.no  Fact Sheet for Healthcare Providers: quierodirigir.com  This test is not yet approved or cleared by the [place] FDA and  has been authorized for detection and/or diagnosis of SARS-CoV-2 by FDA under an Emergency Use  Authorization (EUA). This EUA will remain  in effect (meaning this test can be used) for the duration of the COVID-19 declaration under Se ction 564(b)(1) of the Act, 21 U.S.C. section 360bbb-3(b)(1), unless the authorization is terminated or revoked sooner.  Performed at [facility], [address] [place], [place] [address]   Blood culture (routine x 2)     Status: None (Preliminary result)   Collection Time: 10/19/20  9:42 PM   Specimen: BLOOD  Result Value Ref Range Status   Specimen Description   Final    BLOOD BLOOD RIGHT HAND Performed at [facility], [address], [place], [place] [address]    Special Requests   Final    BOTTLES DRAWN AEROBIC ONLY Blood Culture adequate volume Performed at [facility], [address], [place], [place] [address]    Culture   Final    NO GROWTH < 12 HOURS Performed at [facility], [address], [place], [place] [address]    Report Status PENDING  Incomplete         Radiology Studies: CT ABDOMEN PELVIS W CONTRAST  Result Date: 10/19/2020 CLINICAL DATA:  Abdominal abscess/infection. Drainage from back wounds. EXAM: CT ABDOMEN AND PELVIS WITH CONTRAST TECHNIQUE: Multidetector CT imaging of the abdomen and pelvis was performed using the standard protocol following bolus administration of intravenous contrast. CONTRAST:  OMNIPAQUE IOHEXOL 300 MG/ML  SOLN COMPARISON:  07/21/2020 FINDINGS: Lower chest: Mild basilar scarring.  No active lower chest disease. Hepatobiliary: No liver parenchymal lesion. Chronic intra and extrahepatic biliary ductal dilatation which may be becoming more prominent over time. The common duct appears to be dilated all the way to the ampulla. No calcified gallstones are seen. The gallbladder is distended. Pancreas: No pancreatic parenchymal lesion is seen. Pancreatic duct is mildly dilated. Spleen: Negative Adrenals/Urinary Tract: No adrenal or renal lesion.  Bladder is unremarkable. Stomach/Bowel: Moderate amount of fecal matter in the colon. No other bowel finding. Vascular/Lymphatic: Aortic atherosclerosis. No aneurysm. IVC is normal. No adenopathy. Reproductive: Uterine leiomyomas.  No significant pelvic mass. Other: No free fluid or air. 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 No evidence of significant soft tissue abscess collection. See results of lumbar CT. IMPRESSION: 1. 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 No evidence of significant soft tissue abscess collection. See results of lumbar CT. 2. Chronic intra and extrahepatic biliary ductal dilatation which may be becoming more prominent over time. The common duct appears to be dilated all the way to the ampulla. No calcified gallstones are seen. The gallbladder is distended. Consider ultrasound and/or ERCP if there is clinical evidence of biliary obstruction. 3. Aortic atherosclerosis. 4. Uterine leiomyomas. 5. Moderate amount of fecal matter in the colon. Aortic Atherosclerosis (ICD10-I70.0).  A Electronically Signed   By: Paulina Fusi M.D.   On: 10/19/2020 18:24   CT L-SPINE NO CHARGE  Result Date: 10/19/2020 CLINICAL DATA:  Decubitus ulcer.  In EXAM: CT LUMBAR SPINE WITHOUT CONTRAST TECHNIQUE: Multidetector CT imaging of the lumbar spine was performed without intravenous contrast administration. Multiplanar CT image reconstructions were also generated. COMPARISON:  CT abdomen same day.  Previous lumbar radiographs. FINDINGS: Segmentation: 5 lumbar type vertebral bodies. Alignment: Thoracolumbar curvature convex to the right and lower lumbar curvature convex to the left. Vertebrae: There is been previous fusion from L1 to the sacrum, with hardware removal. Screw shadows remain visible in the pedicles. There appears to be solid union  throughout that segment. There are hypertrophic sclerotic changes of the posterior elements at T11, T12 and L1 which could relate to chronic degenerative change, but the possibility of chronic osteomyelitis in that region is not excluded. There is no gross destructive bone change. The posterior elements at T11, T12 and L1 are immediately at the area of the decubitus ulcer. Increased kyphotic curvature in that region probably places the overlying soft tissue at risk. There are old minor compression deformities at T11 and T12 Paraspinal and other soft tissues: No evidence of para spinous soft tissue abscess. Disc levels: As noted above, distant fusion through the lumbosacral region with sufficient patency of the canal. IMPRESSION: 1. Previous fusion from L1 to the sacrum with hardware removal. There appears to be solid union throughout that segment. 2. Hypertrophic sclerotic changes of the posterior elements at T11, T12 and L1 which could relate to chronic degenerative change, but the possibility of chronic osteomyelitis in that region is not excluded. There is no gross destructive bone change. There is increased kyphotic curvature in that region which probably places the overlying soft tissue at risk. There is no evidence of para spinous soft tissue abscess. The posterior elements of T11, T12 and L1 are closely related to the area of decubitus ulcer. Electronically Signed   By: Paulina Fusi M.D.   On: 10/19/2020 18:31        Scheduled Meds: . apixaban  5 mg Oral BID  . vitamin C  1,000 mg Oral Daily  . busPIRone  15 mg Oral BID  . collagenase   Topical Daily  . donepezil  10 mg Oral QHS  . dronabinol  2.5 mg Oral QHS  . ferrous sulfate  325 mg Oral Q breakfast  . linaclotide  145 mcg Oral Daily  . memantine  28 mg Oral QHS   Continuous Infusions: . ceFEPime (MAXIPIME) IV 2 g (10/20/20 1129)  . dextrose 125 mL/hr at 10/20/20 1428  . potassium chloride 10 mEq (10/20/20 1428)  . vancomycin        LOS: 1 day  Kathlen ModyVijaya Virginia Curl, MD [facility]   To contact the attending provider between 7A-7P or the covering provider during after hours 7P-7A, please log into the web site [email or web address] and access using universal [place] password for that web site. If you do not have the password, please call the hospital operator.  10/20/2020, 3:15 PM

## 2020-10-21 ENCOUNTER — Inpatient Hospital Stay (HOSPITAL_COMMUNITY): Payer: Medicare Other

## 2020-10-21 DIAGNOSIS — R0902 Hypoxemia: Secondary | ICD-10-CM | POA: Diagnosis not present

## 2020-10-21 DIAGNOSIS — M7989 Other specified soft tissue disorders: Secondary | ICD-10-CM

## 2020-10-21 DIAGNOSIS — E870 Hyperosmolality and hypernatremia: Secondary | ICD-10-CM | POA: Diagnosis not present

## 2020-10-21 DIAGNOSIS — L8994 Pressure ulcer of unspecified site, stage 4: Secondary | ICD-10-CM | POA: Diagnosis not present

## 2020-10-21 DIAGNOSIS — I10 Essential (primary) hypertension: Secondary | ICD-10-CM | POA: Diagnosis not present

## 2020-10-21 DIAGNOSIS — E876 Hypokalemia: Secondary | ICD-10-CM | POA: Diagnosis not present

## 2020-10-21 LAB — BLOOD CULTURE ID PANEL (REFLEXED) - BCID2

## 2020-10-21 LAB — BLOOD GAS, ARTERIAL
Acid-base deficit: 0.6 mmol/L (ref 0.0–2.0)
Bicarbonate: 23.8 mmol/L (ref 20.0–28.0)
Drawn by: 331471
FIO2: 100
O2 Content: 15 L/min
O2 Saturation: 78.9 %
Patient temperature: 98.6
pCO2 arterial: 40.5 mmHg (ref 32.0–48.0)
pH, Arterial: 7.387 (ref 7.350–7.450)
pO2, Arterial: 48.5 mmHg — ABNORMAL LOW (ref 83.0–108.0)

## 2020-10-21 LAB — MAGNESIUM: Magnesium: 2.2 mg/dL (ref 1.7–2.4)

## 2020-10-21 LAB — BASIC METABOLIC PANEL
Anion gap: 11 (ref 5–15)
BUN: 12 mg/dL (ref 8–23)
CO2: 24 mmol/L (ref 22–32)
Calcium: 8.5 mg/dL — ABNORMAL LOW (ref 8.9–10.3)
Chloride: 111 mmol/L (ref 98–111)
Creatinine, Ser: 0.46 mg/dL (ref 0.44–1.00)
GFR, Estimated: >60 mL/min (ref >60)
Glucose, Bld: 97 mg/dL (ref 70–99)
Potassium: 3.8 mmol/L (ref 3.5–5.1)
Sodium: 146 mmol/L — ABNORMAL HIGH (ref 135–145)

## 2020-10-21 LAB — CBC
HCT: 36 % (ref 36.0–46.0)
Hemoglobin: 10.6 g/dL — ABNORMAL LOW (ref 12.0–15.0)
MCH: 31.1 pg (ref 26.0–34.0)
MCHC: 29.4 g/dL — ABNORMAL LOW (ref 30.0–36.0)
MCV: 105.6 fL — ABNORMAL HIGH (ref 80.0–100.0)
Platelets: 126 10*3/uL — ABNORMAL LOW (ref 150–400)
RBC: 3.41 MIL/uL — ABNORMAL LOW (ref 3.87–5.11)
RDW: 18.6 % — ABNORMAL HIGH (ref 11.5–15.5)
WBC: 10.9 10*3/uL — ABNORMAL HIGH (ref 4.0–10.5)
nRBC: 0.9 % — ABNORMAL HIGH (ref 0.0–0.2)

## 2020-10-21 LAB — MRSA PCR SCREENING: MRSA by PCR: POSITIVE — AB

## 2020-10-21 LAB — GLUCOSE, CAPILLARY: Glucose-Capillary: 118 mg/dL — ABNORMAL HIGH (ref 70–99)

## 2020-10-21 LAB — LACTIC ACID, PLASMA: Lactic Acid, Venous: 2.8 mmol/L (ref 0.5–1.9)

## 2020-10-21 LAB — PHOSPHORUS: Phosphorus: 4.1 mg/dL (ref 2.5–4.6)

## 2020-10-21 MED ORDER — CHLORHEXIDINE GLUCONATE CLOTH 2 % EX PADS
6.0000 | MEDICATED_PAD | Freq: Every day | CUTANEOUS | Status: DC
  Administered 2020-10-21 – 2020-11-04 (×14): 6 via TOPICAL

## 2020-10-21 MED ORDER — MUPIROCIN 2 % EX OINT
1.0000 "application " | TOPICAL_OINTMENT | Freq: Two times a day (BID) | CUTANEOUS | Status: AC
  Administered 2020-10-21 – 2020-10-26 (×10): 1 via NASAL
  Filled 2020-10-21 (×2): qty 22

## 2020-10-21 MED ORDER — VANCOMYCIN HCL 750 MG/150ML IV SOLN
750.0000 mg | INTRAVENOUS | Status: DC
  Administered 2020-10-21 – 2020-10-25 (×5): 750 mg via INTRAVENOUS
  Filled 2020-10-21 (×5): qty 150

## 2020-10-21 MED ORDER — LACTATED RINGERS IV SOLN: INTRAVENOUS | Status: DC

## 2020-10-21 MED ORDER — IOHEXOL 350 MG/ML SOLN
100.0000 mL | Freq: Once | INTRAVENOUS | Status: AC | PRN
  Administered 2020-10-21: 100 mL via INTRAVENOUS

## 2020-10-21 NOTE — Progress Notes (Signed)
PHARMACY - PHYSICIAN COMMUNICATION CRITICAL VALUE ALERT - BLOOD CULTURE IDENTIFICATION (BCID)  [name] is an 80 y.o. female with hx hardware infection of the thoracolumbar area and osteomyelitis who presented to [facility] on 10/19/2020 with a chief complaint of back wounds.She was started on vancomycin and cefepime on admission for infection. One bottle of blood culture collected on 3/10 has GPC in clusters (BCID= staph species).  Name of physician (or Provider) Contacted: X.Blount  Current antibiotics: vancomycin and cefepime  Changes to prescribed antibiotics recommended:  - continue vancomycin and cefepime  Results for orders placed or performed during the hospital encounter of 10/19/20  Blood Culture ID Panel (Reflexed) (Collected: 10/19/2020  9:42 PM)  Result Value Ref Range   Enterococcus faecalis NOT DETECTED NOT DETECTED   Enterococcus Faecium NOT DETECTED NOT DETECTED   Listeria monocytogenes NOT DETECTED NOT DETECTED   Staphylococcus species DETECTED (A) NOT DETECTED   Staphylococcus aureus (BCID) NOT DETECTED NOT DETECTED   Staphylococcus epidermidis NOT DETECTED NOT DETECTED   Staphylococcus lugdunensis NOT DETECTED NOT DETECTED   Streptococcus species NOT DETECTED NOT DETECTED   Streptococcus agalactiae NOT DETECTED NOT DETECTED   Streptococcus pneumoniae NOT DETECTED NOT DETECTED   Streptococcus pyogenes NOT DETECTED NOT DETECTED   A.calcoaceticus-baumannii NOT DETECTED NOT DETECTED   Bacteroides fragilis NOT DETECTED NOT DETECTED   Enterobacterales NOT DETECTED NOT DETECTED   Enterobacter cloacae complex NOT DETECTED NOT DETECTED   Escherichia coli NOT DETECTED NOT DETECTED   Klebsiella aerogenes NOT DETECTED NOT DETECTED   Klebsiella oxytoca NOT DETECTED NOT DETECTED   Klebsiella pneumoniae NOT DETECTED NOT DETECTED   Proteus species NOT DETECTED NOT DETECTED   Salmonella species NOT DETECTED NOT DETECTED   Serratia marcescens NOT DETECTED NOT DETECTED    Haemophilus influenzae NOT DETECTED NOT DETECTED   Neisseria meningitidis NOT DETECTED NOT DETECTED   Pseudomonas aeruginosa NOT DETECTED NOT DETECTED   Stenotrophomonas maltophilia NOT DETECTED NOT DETECTED   Candida albicans NOT DETECTED NOT DETECTED   Candida auris NOT DETECTED NOT DETECTED   Candida glabrata NOT DETECTED NOT DETECTED   Candida krusei NOT DETECTED NOT DETECTED   Candida parapsilosis NOT DETECTED NOT DETECTED   Candida tropicalis NOT DETECTED NOT DETECTED   Cryptococcus neoformans/gattii NOT DETECTED NOT DETECTED    Lucia Gaskins 10/21/2020  6:26 AM

## 2020-10-21 NOTE — Progress Notes (Signed)
Rapid response called at 1500 for hypoxia unable to be reversed with nasal cannula and NRB, concern for aspiration of secretions. Pt was on room air and desaturated into the 60's. NRB brought SpO2 up to mid 70's. Respiratory therapy at bedside for nasopharyngeal suctioning with little to no relief. Pt is a partial code, all measures appropriate except for intubation. Physician to bedside at 1525. STAT ABG, CXR, and head CT ordered. Transferred to the stepdown unit, room 1237. Physician updating family. Report given to [place], Charity fundraiser.

## 2020-10-21 NOTE — Progress Notes (Signed)
Pharmacy Antibiotic Note  [name] is a 80 y.o. 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 She was started on vancomycin and zosyn on admission for suspected wound infection.  Today, 10/21/2020: - day #2 abx - Afeb, wbc 10.9 - scr stable at 0.46 - 1 blood cx bottle collected on 3/10 has staph species  Plan: - adjust vancomycin dose to 750 mg IV q24h for est AUC 414 - cefepime 2gm IV q12h - f/u LOT of abx  ________________________________________  Height: 5\' 2"  (157.5 cm) Weight: 61.3 kg (135 lb 2.3 oz) IBW/kg (Calculated) : 50.1  Temp (24hrs), Avg:98.2 F (36.8 C), Min:97.9 F (36.6 C), Max:98.6 F (37 C)  Recent Labs  Lab 10/19/20 1611 10/19/20 1711 10/19/20 1714 10/19/20 2142 10/20/20 0509 10/20/20 0950 10/20/20 2030 10/21/20 0553  WBC 11.2*  --   --   --   --   --   --  10.9*  CREATININE  --    < > 0.58  --  0.47 0.47 0.48 0.46  LATICACIDVEN 1.5  --   --  2.7* 1.9 2.0*  --   --    < > = values in this interval not displayed.    Estimated Creatinine Clearance: 48.3 mL/min (by C-G formula based on SCr of 0.46 mg/dL).    Allergies  Allergen Reactions  . T-Pa [Alteplase] Swelling    Does okay with it if premedicated with benadryl.  . Codeine Itching     Thank you for allowing pharmacy to be a part of this patient's care.  12/21/20 10/21/2020 7:26 AM

## 2020-10-21 NOTE — Progress Notes (Signed)
PT Cancellation Note  Patient Details Name: [name] MRN: [id number] DOB: 30-Jul-1941   Cancelled Treatment:     Chart reviewed and spoke to patients nurse. Per palliative home note, pt is bedbound at home and dependent for all ADLS and palliative assisting family with equipment needed in home already. Pt does not seem to be a good candidate for skilled PT for goals and progression with her deficits.   If this changes or we learn something different from family, please don't hesitate to call us back in. Signing off a this time  Thank you    Marella Bile 10/21/2020, 10:19 AM  Clois Dupes, PT, MPT Acute Rehabilitation Services Office: [phone] Pager: [phone] 10/21/2020

## 2020-10-21 NOTE — Progress Notes (Signed)
Left upper extremity venous study completed.     Please see CV Proc for preliminary results.   Nealy Hickmon, RVT   

## 2020-10-21 NOTE — Progress Notes (Signed)
RT NOTE:  RT NTS pt per MD order. RT was able to obtain a small amount of yellow/tan secretions. Vitals are stable at this time, RT will continue to monitor.

## 2020-10-21 NOTE — Progress Notes (Addendum)
PROGRESS NOTE    [name]  QIO:[id number] DOB: 1941/01/25 DOA: 10/19/2020 PCP: [name], MD    Chief Complaint  Patient presents with  . 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  On further discussion with the patient's son he reports she takes minimal nutrition orally.  On arrival to ED she was found to be severely hyper natremic, and failure to thrive.  She was found to have multiple decubitus ulcers on the sacrum and left buttock area.  She was empirically started on IV antibiotics for possible infection of the decubitus ulcers. Wound care consulted for recommendations.  Assessment & Plan:   Principal Problem:   Pressure injury of skin with infection Active Problems:   HTN (hypertension)   Takotsubo cardiomyopathy   OSA (obstructive sleep apnea)   Cellulitis   Hypernatremia   Infected multiple decubitus ulcer  Empirically on IV antibiotics.  Care consulted and recommendations given. CT of the abdomen pelvis does not show any acute osteomyelitis at this time. 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 No evidence of significant soft tissue abscess collection. CT of the lumbar spine shows Hypertrophic sclerotic changes of the posterior elements at T11, T12 and L1 which could relate to chronic degenerative change, but the possibility of chronic osteomyelitis in that region is not excluded. There is no gross destructive bone change. There is increased kyphotic curvature in that region which probably places the overlying soft tissue at risk. There is no evidence of paraspinous soft  tissue abscess. The posterior elements of T11, T12 and L1 are closely related to the area of decubitus ulcer. We will continue empiric antibiotics and will discuss with ID for duration of antibiotics as we have staph bacteremia.    Failure to thrive, poor oral intake, AKI and hyper natremia Secondary to poor oral intake and free water deficit Started the patient on IV fluids and monitor sodium levels. Sodium levels have improved to 146.      Essential hypertension BP parameters are optimal.     Dementia No agitation, pt appears to be at baseline.  On Aricept and Namenda.    Poor oral intake/dysphagia Patient son would like IR consult for PEG placement Requested.  SLP evaluation recommending full liquid diet and crushed pills and gave with applesauce.    History of DVT and pulmonary embolism  on Eliquis continue the same.   Hypokalemia replaced   Hypophosphatemia Replaced    Lactic acidosis Secondary to dehydration, repeat levels tonight.     Mild thrombocytopenia Unclear etiology, continue to monitor.  No signs of obvious bleeding     Staph bacteremia:  Pt on IV vancomycin.  Continue the same.    Left arm swollen:  Unclear etiology.  Will get venous duplex of the upper extremity.   DVT prophylaxis: eliquis Code Status: PARTIAL  Family Communication: discussed with son over the phone.  Disposition:   Status is: Inpatient  Remains inpatient appropriate because:Ongoing diagnostic testing needed not appropriate for outpatient work up, Unsafe d/c plan and IV treatments appropriate due to intensity of illness or inability to take PO   Dispo: The patient is from:  Home              Anticipated d/c is to: Home              Patient currently is not medically stable to d/c.   Difficult to place patient No       Consultants:   IR  Palliative care.    Procedures: none.   Antimicrobials:  Antibiotics Given (last 72 hours)    Date/Time Action  Medication Dose Rate   10/19/20 1716 New Bag/Given   ceFAZolin (ANCEF) IVPB 1 g/50 mL premix 1 g 100 mL/hr   10/19/20 2345 New Bag/Given   ceFEPIme (MAXIPIME) 2 g in sodium chloride 0.9 % 100 mL IVPB 2 g 200 mL/hr   10/20/20 0035 New Bag/Given   vancomycin (VANCOREADY) IVPB 1000 mg/200 mL 1,000 mg 200 mL/hr   10/20/20 1129 New Bag/Given   ceFEPIme (MAXIPIME) 2 g in sodium chloride 0.9 % 100 mL IVPB 2 g 200 mL/hr   10/20/20 2115 New Bag/Given   ceFEPIme (MAXIPIME) 2 g in sodium chloride 0.9 % 100 mL IVPB 2 g 200 mL/hr   10/20/20 2247 New Bag/Given   vancomycin (VANCOREADY) IVPB 1250 mg/250 mL 1,250 mg 166.7 mL/hr   10/21/20 1002 New Bag/Given   ceFEPIme (MAXIPIME) 2 g in sodium chloride 0.9 % 100 mL IVPB 2 g 200 mL/hr        Subjective: Patient responding to questions with yes and no  Objective: Vitals:   10/20/20 0940 10/20/20 1247 10/20/20 2128 10/21/20 0600  BP: (!) 92/52 (!) 141/73 114/60 121/77  Pulse: (!) 59 (!) 56 67 98  Resp: 16 16 14 14   Temp:  97.9 F (36.6 C) 98.1 F (36.7 C) 98.6 F (37 C)  TempSrc:  Axillary Oral Oral  SpO2: 93%  95% 95%  Weight:      Height:        Intake/Output Summary (Last 24 hours) at 10/21/2020 1240 Last data filed at 10/21/2020 0555 Gross per 24 hour  Intake --  Output 400 ml  Net -400 ml   Filed Weights   10/19/20 2107  Weight: 61.3 kg    Examination:  General exam: Alert and comfortable not in any kind of distress Respiratory system: Air entry fair, bilateral no wheezing or rhonchi. Cardiovascular system: S1-S2 heard, regular rate rhythm, no JVD Gastrointestinal system: Abdomen is soft nontender bowel sounds normal Central nervous system: Alert and able to answer simple questions Extremities: Left upper extremity edema present Skin: Multiple decubitus ulcers on the sacrum and left buttock Psychiatry: Mood is appropriate    Data Reviewed: I have personally reviewed following labs and imaging studies  CBC: Recent Labs   Lab 10/19/20 1611 10/19/20 1711 10/21/20 0553  WBC 11.2*  --  10.9*  NEUTROABS 8.4*  --   --   HGB 11.7* 12.2 10.6*  HCT 40.2 36.0 36.0  MCV 107.2*  --  105.6*  PLT 121*  --  126*    Basic Metabolic Panel: Recent Labs  Lab 10/19/20 1714 10/20/20 0509 10/20/20 0950 10/20/20 2030 10/21/20 0553  NA 161* 154* 150* 147* 146*  K 3.1* 3.4* 3.0* 4.0 3.8  CL 121* 117* 117* 112* 111  CO2 30 27 26 26 24   GLUCOSE 104* 136* 184* 95 97  BUN 16 15 13 12 12   CREATININE 0.58 0.47 0.47 0.48 0.46  CALCIUM 9.7 8.6* 8.8* 8.2* 8.5*  MG  --   --  2.2  --  2.2  PHOS  --   --  2.0*  --  4.1    GFR: Estimated Creatinine Clearance: 48.3 mL/min (by C-G formula based on SCr of 0.46 mg/dL).  Liver Function Tests: No results for input(s): AST, ALT, ALKPHOS, BILITOT, PROT, ALBUMIN in the last 168 hours.  CBG: No results for input(s): GLUCAP in the last 168 hours.   Recent Results (from the past 240 hour(s))  Blood culture (routine x 2)     Status: None (Preliminary result)   Collection Time: 10/19/20  5:02 PM   Specimen: BLOOD LEFT FOREARM  Result Value Ref Range Status   Specimen Description   Final    BLOOD LEFT FOREARM Performed at [facility], [address], [place], [place] [address]    Special Requests   Final    BOTTLES DRAWN AEROBIC AND ANAEROBIC Blood Culture results may not be optimal due to an excessive volume of blood received in culture bottles Performed at [facility], [address], [place], [place] [address]    Culture   Final    NO GROWTH < 24 HOURS Performed at [facility], [address], [place], [place] [address]    Report Status PENDING  Incomplete  SARS CORONAVIRUS 2 (TAT 6-24 HRS) Nasopharyngeal Nasopharyngeal Swab     Status: None   Collection Time: 10/19/20  5:14 PM   Specimen: Nasopharyngeal Swab  Result Value Ref Range Status   SARS Coronavirus 2 NEGATIVE NEGATIVE Final    Comment: (NOTE) SARS-CoV-2  target nucleic acids are NOT DETECTED.  The SARS-CoV-2 RNA is generally detectable in upper and lower respiratory specimens during the acute phase of infection. Negative results do not preclude SARS-CoV-2 infection, do not rule out co-infections with other pathogens, and should not be used as the sole basis for treatment or other patient management decisions. Negative results must be combined with clinical observations, patient history, and epidemiological information. The expected result is Negative.  Fact Sheet for Patients: HairSlick.no  Fact Sheet for Healthcare Providers: quierodirigir.com  This test is not yet approved or cleared by the [place] FDA and  has been authorized for detection and/or diagnosis of SARS-CoV-2 by FDA under an Emergency Use Authorization (EUA). This EUA will remain  in effect (meaning this test can be used) for the duration of the COVID-19 declaration under Se ction 564(b)(1) of the Act, 21 U.S.C. section 360bbb-3(b)(1), unless the authorization is terminated or revoked sooner.  Performed at [facility], [address], [place], [place] [address]   Blood culture (routine x 2)     Status: None (Preliminary result)   Collection Time: 10/19/20  9:42 PM   Specimen: BLOOD  Result Value Ref Range Status   Specimen Description   Final    BLOOD BLOOD RIGHT HAND Performed at [facility], [address] [place], [place] [address]    Special Requests   Final    BOTTLES DRAWN AEROBIC ONLY Blood Culture adequate volume Performed at [facility], [address] [place], [place] [address]    Culture  Setup Time   Final    GRAM POSITIVE COCCI IN CLUSTERS AEROBIC BOTTLE ONLY Organism ID to follow CRITICAL RESULT CALLED TO, READ BACK BY AND VERIFIED WITH: PHARMD ANH PHAM BY MESSAN H. AT [id number] ON 10/21/2020 Performed at [facility], [address], [place], [place] [address]    Culture PENDING  Incomplete   Report Status PENDING  Incomplete  Blood Culture ID Panel (Reflexed)  Status: Abnormal   Collection Time: 10/19/20  9:42 PM  Result Value Ref Range Status   Enterococcus faecalis NOT DETECTED NOT DETECTED Final   Enterococcus Faecium NOT DETECTED NOT DETECTED Final   Listeria monocytogenes NOT DETECTED NOT DETECTED Final   Staphylococcus species DETECTED (A) NOT DETECTED Final    Comment: CRITICAL RESULT CALLED TO, READ BACK BY AND VERIFIED WITH: PHARMD ANH PHAM BY MESSAN H. AT [id number] ON 10/21/2020    Staphylococcus aureus (BCID) NOT DETECTED NOT DETECTED Final   Staphylococcus epidermidis NOT DETECTED NOT DETECTED Final   Staphylococcus lugdunensis NOT DETECTED NOT DETECTED Final   Streptococcus species NOT DETECTED NOT DETECTED Final   Streptococcus agalactiae NOT DETECTED NOT DETECTED Final   Streptococcus pneumoniae NOT DETECTED NOT DETECTED Final   Streptococcus pyogenes NOT DETECTED NOT DETECTED Final   A.calcoaceticus-baumannii NOT DETECTED NOT DETECTED Final   Bacteroides fragilis NOT DETECTED NOT DETECTED Final   Enterobacterales NOT DETECTED NOT DETECTED Final   Enterobacter cloacae complex NOT DETECTED NOT DETECTED Final   Escherichia coli NOT DETECTED NOT DETECTED Final   Klebsiella aerogenes NOT DETECTED NOT DETECTED Final   Klebsiella oxytoca NOT DETECTED NOT DETECTED Final   Klebsiella pneumoniae NOT DETECTED NOT DETECTED Final   Proteus species NOT DETECTED NOT DETECTED Final   Salmonella species NOT DETECTED NOT DETECTED Final   Serratia marcescens NOT DETECTED NOT DETECTED Final   Haemophilus influenzae NOT DETECTED NOT DETECTED Final   Neisseria meningitidis NOT DETECTED NOT DETECTED Final   Pseudomonas aeruginosa NOT DETECTED NOT DETECTED Final   Stenotrophomonas maltophilia NOT DETECTED NOT DETECTED Final   Candida albicans NOT DETECTED NOT DETECTED Final   Candida auris NOT DETECTED NOT DETECTED  Final   Candida glabrata NOT DETECTED NOT DETECTED Final   Candida krusei NOT DETECTED NOT DETECTED Final   Candida parapsilosis NOT DETECTED NOT DETECTED Final   Candida tropicalis NOT DETECTED NOT DETECTED Final   Cryptococcus neoformans/gattii NOT DETECTED NOT DETECTED Final    Comment: Performed at [facility], [address] [place], [place] [address]         Radiology Studies: CT ABDOMEN PELVIS W CONTRAST  Result Date: 10/19/2020 CLINICAL DATA:  Abdominal abscess/infection. Drainage from back wounds. EXAM: CT ABDOMEN AND PELVIS WITH CONTRAST TECHNIQUE: Multidetector CT imaging of the abdomen and pelvis was performed using the standard protocol following bolus administration of intravenous contrast. CONTRAST:  100mL OMNIPAQUE IOHEXOL 300 MG/ML  SOLN COMPARISON:  07/21/2020 FINDINGS: Lower chest: Mild basilar scarring.  No active lower chest disease. Hepatobiliary: No liver parenchymal lesion. Chronic intra and extrahepatic biliary ductal dilatation which may be becoming more prominent over time. The common duct appears to be dilated all the way to the ampulla. No calcified gallstones are seen. The gallbladder is distended. Pancreas: No pancreatic parenchymal lesion is seen. Pancreatic duct is mildly dilated. Spleen: Negative Adrenals/Urinary Tract: No adrenal or renal lesion. Bladder is unremarkable. Stomach/Bowel: Moderate amount of fecal matter in the colon. No other bowel finding. Vascular/Lymphatic: Aortic atherosclerosis. No aneurysm. IVC is normal. No adenopathy. Reproductive: Uterine leiomyomas.  No significant pelvic mass. Other: No free fluid or air. 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 No evidence of significant soft tissue abscess collection. See results of lumbar CT. IMPRESSION: 1. 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 No evidence of significant soft tissue abscess collection.  See results of lumbar CT. 2. Chronic intra and extrahepatic biliary ductal dilatation which may be becoming more prominent over time. The common duct appears to be dilated all the way to the ampulla. No calcified gallstones are seen. The gallbladder is distended. Consider ultrasound and/or ERCP if there is clinical evidence of biliary obstruction. 3. Aortic atherosclerosis. 4. Uterine leiomyomas. 5. Moderate amount of fecal matter in the colon. Aortic Atherosclerosis (ICD10-I70.0).  A Electronically Signed   By: Paulina Fusi M.D.   On: 10/19/2020 18:24   CT L-SPINE NO CHARGE  Result Date: 10/19/2020 CLINICAL DATA:  Decubitus ulcer.  In EXAM: CT LUMBAR SPINE WITHOUT CONTRAST TECHNIQUE: Multidetector CT imaging of the lumbar spine was performed without intravenous contrast administration. Multiplanar CT image reconstructions were also generated. COMPARISON:  CT abdomen same day.  Previous lumbar radiographs. FINDINGS: Segmentation: 5 lumbar type vertebral bodies. Alignment: Thoracolumbar curvature convex to the right and lower lumbar curvature convex to the left. Vertebrae: There is been previous fusion from L1 to the sacrum, with hardware removal. Screw shadows remain visible in the pedicles. There appears to be solid union throughout that segment. There are hypertrophic sclerotic changes of the posterior elements at T11, T12 and L1 which could relate to chronic degenerative change, but the possibility of chronic osteomyelitis in that region is not excluded. There is no gross destructive bone change. The posterior elements at T11, T12 and L1 are immediately at the area of the decubitus ulcer. Increased kyphotic curvature in that region probably places the overlying soft tissue at risk. There are old minor compression deformities at T11 and T12 Paraspinal and other soft tissues: No evidence of para spinous soft tissue abscess. Disc levels: As  noted above, distant fusion through the lumbosacral region with sufficient patency of the canal. IMPRESSION: 1. Previous fusion from L1 to the sacrum with hardware removal. There appears to be solid union throughout that segment. 2. Hypertrophic sclerotic changes of the posterior elements at T11, T12 and L1 which could relate to chronic degenerative change, but the possibility of chronic osteomyelitis in that region is not excluded. There is no gross destructive bone change. There is increased kyphotic curvature in that region which probably places the overlying soft tissue at risk. There is no evidence of para spinous soft tissue abscess. The posterior elements of T11, T12 and L1 are closely related to the area of decubitus ulcer. Electronically Signed   By: Paulina Fusi M.D.   On: 10/19/2020 18:31   DG CHEST PORT 1 VIEW  Result Date: 10/21/2020 CLINICAL DATA:  Cough.  Recent osteomyelitis EXAM: PORTABLE CHEST 1 VIEW COMPARISON:  May 23, 2020. FINDINGS: No edema or airspace opacity. Heart size and contour normal. Pulmonary vascular within normal limits. There is aortic atherosclerosis. No adenopathy. Degenerative change noted in the right shoulder. IMPRESSION: No edema or airspace opacity. Heart size within normal limits. Aortic Atherosclerosis (ICD10-I70.0). Electronically Signed   By: Bretta Bang III M.D.   On: 10/21/2020 11:54        Scheduled Meds: . apixaban  5 mg Oral BID  . vitamin C  1,000 mg Oral Daily  . busPIRone  15 mg Oral BID  . collagenase   Topical Daily  . donepezil  10 mg Oral QHS  . dronabinol  2.5 mg Oral QHS  . ferrous sulfate  325 mg Oral Q breakfast  . linaclotide  145 mcg Oral Daily  . memantine  28 mg Oral QHS   Continuous Infusions: . ceFEPime (MAXIPIME)  IV 2 g (10/21/20 1002)  . vancomycin       LOS: 2 days        Kathlen Mody, MD [facility]   To contact the attending provider between 7A-7P or the covering provider during after hours  7P-7A, please log into the web site [email or web address] and access using universal [place] password for that web site. If you do not have the password, please call the hospital operator.  10/21/2020, 12:40 PM     Addendum: D/C eliquis for procedure on Monday.    Addendum: Notified by RN that patient desaturated to 70% from RA and unable to arouse around 3:07 . RR called.  Called to get ABG stat and CXR.  On exam  Pt is sleepy/ drowsy but able to say her name and follow simple commands.  She is tachypneic, BP 101/70's HR is 90 /min.  She is on NRB with sats around 94 to 95%.  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  Able to tell me her name and follow simple commands like taking a deep breath and squeezing hand. She is bed bound with multiple decubitus ulcers and is dependent on all ADLS.   ABG shows 7.38, pco2 is 40 and po2 is 48.5.    Acute respiratory failure with hypoxia requiring NRB, baseline on RA.  Probably secondary to aspiration of her own secretions.  She has been on Eliquis for h/o PE and DVT, which has been held for the procedure in am.  CXR does not show any acute disease.  ABg shows hypoxia.    Acute encephalopathy secondary to acute respiratory failure vs ? CVA ( due to her inability to swallow)  As per SLP yesterday, she can take thin liquid/ full liquid with whole meds with liquid. SLP eval today unable to swallow .  Will get STAT CT head without contrast to evaluate for stroke. Ordered at the time of RR.  Discussed with RN in ICU.    Called the son to update on the change of status. Son and daughter in law became angry, started shouting, said that she was fine yesterday, that they fed baby food for dinner and they tried to feed her this afternoon, she was fine when they left the hospital. They said RN and we gave her some thing and that's why she aspirated. They became rude, called [place] lazy, and other names, wanted a different RN  and doctor to take care of their mom and hung up on the phone.    Reviewed the ABG, spoke to ICU RN and updated and requested to call radiology for stat CT head without contrast for further evaluation.    Kathlen Mody, MD

## 2020-10-21 NOTE — Progress Notes (Signed)
Request for IR to eval for PEG tube noted. Imaging reviewed, anatomy is amenable to percutaneous approach. Chart reviewed, pt on Eliquis. Eliquis would need to be held for 48 hrs. Could tentatively proceed with G-tube as early as Monday if Eliquis is held. IR team will formally consult to discuss with pt and family.  Brayton El PA-C Interventional Radiology 10/21/2020 1:25 PM

## 2020-10-21 NOTE — Progress Notes (Signed)
CT results are complete, Blont, NP made aware. Thanks

## 2020-10-21 NOTE — Progress Notes (Signed)
  Speech Language Pathology Treatment: Dysphagia  Patient Details Name: ABBIGAYLE TOOLE MRN: [id number] DOB: [birth year] Today's Date: 10/21/2020 Time: 4174-0814 SLP Time Calculation (min) (ACUTE ONLY): 10 min  Assessment / Plan / Recommendation Clinical Impression  Pt demonstrates an acute change in function in comparison with yesterday. Today pt has been observed to be unable to manage her secretions. She has not been able to take her meds orally and RN very concerned about aspiration. Pt found to be alert, able to follow simple commands. Examination of oral cavity shows mild pooling of bloody secretions without source of blood found. When given sips initially, pt sealed lips to cup edge, accepted water and appeared to transit the water, but never swallowed x3 despite cueing. When given a larger sip, pt had appearance of late/reduced swallow mechanism with immediate coughing and prolonged wet vocal quality. SLP repositioned pts head in a more neutral/forward posture for improved movement of hyolaryngeal mechanism during pts spontaneous subswallow attempts without improvement. Pt orally held puree without posterior transit to initiate.  Unsure what elicited this change in function as pt is still fully alert and even able to state she cannot swallow. May benefit from neuroimaging given change. Pt is now recommended to remain NPO as she is not capable of consuming any PO without prolonged choking. Will f/u for trials.   HPI HPI: 80yo female admitted 10/19/20 due to discharge from decubitus ulcer. 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 Pt known to ST services      SLP Plan  Continue with current plan of care       Recommendations  Diet recommendations: NPO Medication Administration: Via alternative means                Oral Care Recommendations: Oral care QID Follow up Recommendations: 24 hour supervision/assistance SLP Visit Diagnosis: Dysphagia, oropharyngeal phase  (R13.12) Plan: Continue with current plan of care       GO                Rojelio Uhrich, Riley Nearing 10/21/2020, 2:27 PM

## 2020-10-22 DIAGNOSIS — G4733 Obstructive sleep apnea (adult) (pediatric): Secondary | ICD-10-CM | POA: Diagnosis not present

## 2020-10-22 DIAGNOSIS — J690 Pneumonitis due to inhalation of food and vomit: Secondary | ICD-10-CM

## 2020-10-22 DIAGNOSIS — E870 Hyperosmolality and hypernatremia: Secondary | ICD-10-CM | POA: Diagnosis not present

## 2020-10-22 DIAGNOSIS — L8994 Pressure ulcer of unspecified site, stage 4: Secondary | ICD-10-CM | POA: Diagnosis not present

## 2020-10-22 DIAGNOSIS — R7881 Bacteremia: Secondary | ICD-10-CM

## 2020-10-22 DIAGNOSIS — I10 Essential (primary) hypertension: Secondary | ICD-10-CM | POA: Diagnosis not present

## 2020-10-22 LAB — BASIC METABOLIC PANEL
Anion gap: 10 (ref 5–15)
BUN: 12 mg/dL (ref 8–23)
CO2: 25 mmol/L (ref 22–32)
Calcium: 8.8 mg/dL — ABNORMAL LOW (ref 8.9–10.3)
Chloride: 112 mmol/L — ABNORMAL HIGH (ref 98–111)
Creatinine, Ser: 0.48 mg/dL (ref 0.44–1.00)
GFR, Estimated: >60 mL/min (ref >60)
Glucose, Bld: 88 mg/dL (ref 70–99)
Potassium: 3.7 mmol/L (ref 3.5–5.1)
Sodium: 147 mmol/L — ABNORMAL HIGH (ref 135–145)

## 2020-10-22 LAB — MAGNESIUM: Magnesium: 2.2 mg/dL (ref 1.7–2.4)

## 2020-10-22 MED ORDER — ORAL CARE MOUTH RINSE
15.0000 mL | Freq: Two times a day (BID) | OROMUCOSAL | Status: DC
  Administered 2020-10-22 – 2020-11-07 (×31): 15 mL via OROMUCOSAL

## 2020-10-22 NOTE — Progress Notes (Signed)
Patient heart rate continues to slow down into the 50's and occasionally the 40's. Heart monitor showing more frequent PVC's.   An EKG was performed and MD was notified.   Patient was able to maintain SpO2 while on room air for brief period of time but now requiring non-rebreather at 15L.

## 2020-10-22 NOTE — Progress Notes (Signed)
PROGRESS NOTE    [name]  TAV:[id number] DOB: 1941-01-03 DOA: 10/19/2020 PCP: [name], MD    Chief Complaint  Patient presents with  . 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 On further discussion with the patient's son he reports she takes minimal nutrition orally. On arrival to ED she was found to be severely hyper natremic, and failure to thrive. She was found to have multiple decubitus ulcers on the sacrum and left buttock area. She was empirically started on IV antibiotics for possible infection of the decubitus ulcers. Wound care consulted for recommendations.  Assessment & Plan:   Principal Problem:   Pressure injury of skin with infection Active Problems:   HTN (hypertension)   Takotsubo cardiomyopathy   OSA (obstructive sleep apnea)   Cellulitis   Hypernatremia  Infected multiple decubitus ulcer Concurrent suspected staph bacteremia (rule out contaminant) - Empirically on IV vancomycin/cefepime - Follow cultures - will repeat blood cultures until cleared, follow up with ID for possible further imaging/echo/repeat cultures/and antibiotic narrowing as appropriate - CT of the abdomen pelvis does not show any acute osteomyelitis at this time. 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 No evidence of significant soft tissue abscess collection. - CT of the lumbar spine shows Hypertrophic sclerotic changes of the posterior elements at T11, T12 and L1 which could relate to chronic degenerative change, but the possibility of chronic osteomyelitis in that region is not  excluded. There is no gross destructive bone change. - There is increased kyphotic curvature in that region which probably places the overlying soft tissue at risk. There is no evidence of paraspinous soft tissue abscess. The posterior elements of T11, T12 and L1 are closely related to the area of decubitus ulcer.  Acute hypoxic respiratory failure 2/2 aspiration event - Likely secondary to aspiration event - notable changes on CTA 10/21/20 without overt PE - Continue supportive care/oxygen supplementation as necessary; wean oxygen as tolerated - Already on broad spectrum antibiotics as above  Failure to thrive, poor oral intake, AKI and hypovolemic hypernatremia Secondary to poor oral intake and free water deficit - Continues to be NPO currently per SLP - unsafe to feed; previous discussion for G tube placement (per son's request) on hold given patient's tenuous status - Not sure a G-tube is appropriate given patient's history and high risk for complications should she pull it out given baseline dementia Lab Results  Component Value Date   NA 147 (H) 10/22/2020   K 3.7 10/22/2020   CL 112 (H) 10/22/2020   CO2 25 10/22/2020   Essential hypertension BP parameters are optimal.   Dementia No agitation, pt appears to be at baseline.  On Aricept and Namenda.  History of DVT and pulmonary embolism On Eliquis continue the same.  Hypokalemia Replaced  Hypophosphatemia Replaced  Lactic acidosis Secondary to dehydration, repeat levels tonight.   Mild thrombocytopenia Unclear etiology, continue to monitor.  No signs of obvious bleeding  Pressure injury of skin Pressure Injury 10/21/20 Vertebral column Left (Active)  10/21/20 0845  Location: Vertebral column  Location Orientation: Left  Staging:   Wound Description (Comments):   Present  on Admission: Yes   Left arm swollen:  Unclear etiology - possible dependent edema/infiltration of IV Will get venous duplex of the upper  extremity. DVT study negative.  DVT prophylaxis: Eliquis Code Status: DNR Family Communication: discussed with son/daughter in law at length over the phone - lengthy and detailed conversation about patient's current status/prognosis - agreed to make patient DNR now as patient's initial request was "not to be kept alive artificially/on life support" but we explained that if patient were to require CPR she would already be considered dead and that CPR is an attempt to revive her - without respiratory support during this process she would likely have very poor outcome and have prolonged suffering during CPR. Disposition:  Status is: Inpatient  Remains inpatient appropriate because:Ongoing diagnostic testing needed not appropriate for outpatient work up, Unsafe d/c plan and IV treatments appropriate due to intensity of illness or inability to take PO   Dispo: The patient is from: Home              Anticipated d/c is to: Home              Patient currently is not medically stable to d/c.   Difficult to place patient No   Consultants:   IR  Palliative care   Procedures: none.   Antimicrobials:  Antibiotics Given (last 72 hours)    Date/Time Action Medication Dose Rate   10/19/20 1716 New Bag/Given   ceFAZolin (ANCEF) IVPB 1 g/50 mL premix 1 g 100 mL/hr   10/19/20 2345 New Bag/Given   ceFEPIme (MAXIPIME) 2 g in sodium chloride 0.9 % 100 mL IVPB 2 g 200 mL/hr   10/20/20 0035 New Bag/Given   vancomycin (VANCOREADY) IVPB 1000 mg/200 mL 1,000 mg 200 mL/hr   10/20/20 1129 New Bag/Given   ceFEPIme (MAXIPIME) 2 g in sodium chloride 0.9 % 100 mL IVPB 2 g 200 mL/hr   10/20/20 2115 New Bag/Given   ceFEPIme (MAXIPIME) 2 g in sodium chloride 0.9 % 100 mL IVPB 2 g 200 mL/hr   10/20/20 2247 New Bag/Given   vancomycin (VANCOREADY) IVPB 1250 mg/250 mL 1,250 mg 166.7 mL/hr   10/21/20 1002 New Bag/Given   ceFEPIme (MAXIPIME) 2 g in sodium chloride 0.9 % 100 mL IVPB 2 g 200 mL/hr   10/21/20 2138 New  Bag/Given   ceFEPIme (MAXIPIME) 2 g in sodium chloride 0.9 % 100 mL IVPB 2 g 200 mL/hr   10/21/20 2242 New Bag/Given   vancomycin (VANCOREADY) IVPB 750 mg/150 mL 750 mg 150 mL/hr        Subjective: Patient minimally responsive this morning, ROS profoundly limited.  Objective: Vitals:   10/22/20 0000 10/22/20 0100 10/22/20 0400 10/22/20 0500  BP: 122/68 118/72 (!) 142/73 (!) 150/79  Pulse: 81 79 74 78  Resp: 13 12 12 11   Temp:   97.7 F (36.5 C)   TempSrc:   Axillary   SpO2: 100% 100% 100% 100%  Weight:      Height:       No intake or output data in the 24 hours ending 10/22/20 0711 Filed Weights   10/19/20 2107  Weight: 61.3 kg    Examination:  General exam: Alert and comfortable not in any kind of distress Respiratory system: Air entry fair, bilateral no wheezing or rhonchi. Cardiovascular system: S1-S2 heard, regular rate rhythm, no JVD Gastrointestinal system: Abdomen is soft nontender bowel sounds normal Central nervous system: Alert and able to answer simple questions Extremities: Left upper extremity  edema present Skin: Multiple decubitus ulcers on the sacrum and left buttock Psychiatry: Mood is appropriate    Data Reviewed: I have personally reviewed following labs and imaging studies  CBC: Recent Labs  Lab 10/19/20 1611 10/19/20 1711 10/21/20 0553  WBC 11.2*  --  10.9*  NEUTROABS 8.4*  --   --   HGB 11.7* 12.2 10.6*  HCT 40.2 36.0 36.0  MCV 107.2*  --  105.6*  PLT 121*  --  126*    Basic Metabolic Panel: Recent Labs  Lab 10/20/20 0509 10/20/20 0950 10/20/20 2030 10/21/20 0553 10/22/20 0326  NA 154* 150* 147* 146* 147*  K 3.4* 3.0* 4.0 3.8 3.7  CL 117* 117* 112* 111 112*  CO2 27 26 26 24 25   GLUCOSE 136* 184* 95 97 88  BUN 15 13 12 12 12   CREATININE 0.47 0.47 0.48 0.46 0.48  CALCIUM 8.6* 8.8* 8.2* 8.5* 8.8*  MG  --  2.2  --  2.2 2.2  PHOS  --  2.0*  --  4.1  --     GFR: Estimated Creatinine Clearance: 48.3 mL/min (by C-G formula  based on SCr of 0.48 mg/dL).  Liver Function Tests: No results for input(s): AST, ALT, ALKPHOS, BILITOT, PROT, ALBUMIN in the last 168 hours.  CBG: Recent Labs  Lab 10/21/20 1608  GLUCAP 118*     Recent Results (from the past 240 hour(s))  Blood culture (routine x 2)     Status: None (Preliminary result)   Collection Time: 10/19/20  5:02 PM   Specimen: BLOOD LEFT FOREARM  Result Value Ref Range Status   Specimen Description   Final    BLOOD LEFT FOREARM Performed at [facility], [address], [place], [place]    Special Requests   Final    BOTTLES DRAWN AEROBIC AND ANAEROBIC Blood Culture results may not be optimal due to an excessive volume of blood received in culture bottles Performed at [facility], [address], [place], [place]    Culture   Final    NO GROWTH 2 DAYS Performed at [facility], [address], [place], [address] [place]    Report Status PENDING  Incomplete  SARS CORONAVIRUS 2 (TAT 6-24 HRS) Nasopharyngeal Nasopharyngeal Swab     Status: None   Collection Time: 10/19/20  5:14 PM   Specimen: Nasopharyngeal Swab  Result Value Ref Range Status   SARS Coronavirus 2 NEGATIVE NEGATIVE Final    Comment: (NOTE) SARS-CoV-2 target nucleic acids are NOT DETECTED.  The SARS-CoV-2 RNA is generally detectable in upper and lower respiratory specimens during the acute phase of infection. Negative results do not preclude SARS-CoV-2 infection, do not rule out co-infections with other pathogens, and should not be used as the sole basis for treatment or other patient management decisions. Negative results must be combined with clinical observations, patient history, and epidemiological information. The expected result is Negative.  Fact Sheet for Patients: 02725  Fact Sheet for Healthcare Providers: 12/19/20  This test is not  yet approved or cleared by the [place] FDA and  has been authorized for detection and/or diagnosis of SARS-CoV-2 by FDA under an Emergency Use Authorization (EUA). This EUA will remain  in effect (meaning this test can be used) for the duration of the COVID-19 declaration under Se ction 564(b)(1) of the Act, 21 U.S.C. section 360bbb-3(b)(1), unless the authorization is terminated or revoked sooner.  Performed at [facility],  [address] [place], [place] [address]   Blood culture (routine x 2)     Status: None (Preliminary result)   Collection Time: 10/19/20  9:42 PM   Specimen: BLOOD  Result Value Ref Range Status   Specimen Description   Final    BLOOD BLOOD RIGHT HAND Performed at [facility], [address], [place], [place] [address]    Special Requests   Final    BOTTLES DRAWN AEROBIC ONLY Blood Culture adequate volume Performed at [facility], [address] [place], [place] [address]    Culture  Setup Time   Final    GRAM POSITIVE COCCI IN CLUSTERS AEROBIC BOTTLE ONLY Organism ID to follow CRITICAL RESULT CALLED TO, READ BACK BY AND VERIFIED WITH: PHARMD ANH PHAM BY MESSAN H. AT [id number] ON 10/21/2020    Culture   Final    NO GROWTH 1 DAY Performed at [facility], [address], [place], [place] [address]    Report Status PENDING  Incomplete  Blood Culture ID Panel (Reflexed)     Status: Abnormal   Collection Time: 10/19/20  9:42 PM  Result Value Ref Range Status   Enterococcus faecalis NOT DETECTED NOT DETECTED Final   Enterococcus Faecium NOT DETECTED NOT DETECTED Final   Listeria monocytogenes NOT DETECTED NOT DETECTED Final   Staphylococcus species DETECTED (A) NOT DETECTED Final    Comment: CRITICAL RESULT CALLED TO, READ BACK BY AND VERIFIED WITH: PHARMD ANH PHAM BY MESSAN H. AT [id number] ON 10/21/2020    Staphylococcus aureus (BCID) NOT DETECTED NOT DETECTED Final   Staphylococcus epidermidis NOT  DETECTED NOT DETECTED Final   Staphylococcus lugdunensis NOT DETECTED NOT DETECTED Final   Streptococcus species NOT DETECTED NOT DETECTED Final   Streptococcus agalactiae NOT DETECTED NOT DETECTED Final   Streptococcus pneumoniae NOT DETECTED NOT DETECTED Final   Streptococcus pyogenes NOT DETECTED NOT DETECTED Final   A.calcoaceticus-baumannii NOT DETECTED NOT DETECTED Final   Bacteroides fragilis NOT DETECTED NOT DETECTED Final   Enterobacterales NOT DETECTED NOT DETECTED Final   Enterobacter cloacae complex NOT DETECTED NOT DETECTED Final   Escherichia coli NOT DETECTED NOT DETECTED Final   Klebsiella aerogenes NOT DETECTED NOT DETECTED Final   Klebsiella oxytoca NOT DETECTED NOT DETECTED Final   Klebsiella pneumoniae NOT DETECTED NOT DETECTED Final   Proteus species NOT DETECTED NOT DETECTED Final   Salmonella species NOT DETECTED NOT DETECTED Final   Serratia marcescens NOT DETECTED NOT DETECTED Final   Haemophilus influenzae NOT DETECTED NOT DETECTED Final   Neisseria meningitidis NOT DETECTED NOT DETECTED Final   Pseudomonas aeruginosa NOT DETECTED NOT DETECTED Final   Stenotrophomonas maltophilia NOT DETECTED NOT DETECTED Final   Candida albicans NOT DETECTED NOT DETECTED Final   Candida auris NOT DETECTED NOT DETECTED Final   Candida glabrata NOT DETECTED NOT DETECTED Final   Candida krusei NOT DETECTED NOT DETECTED Final   Candida parapsilosis NOT DETECTED NOT DETECTED Final   Candida tropicalis NOT DETECTED NOT DETECTED Final   Cryptococcus neoformans/gattii NOT DETECTED NOT DETECTED Final    Comment: Performed at [facility], [address], [place], [place] [address]  MRSA PCR Screening     Status: Abnormal   Collection Time: 10/21/20  4:15 PM   Specimen: Nasopharyngeal  Result Value Ref Range Status   MRSA by PCR POSITIVE (A) NEGATIVE Final    Comment:        The GeneXpert MRSA Assay (FDA approved for  NASAL specimens only), is one component of  a comprehensive MRSA colonization surveillance program. It is not intended to diagnose MRSA infection nor to guide or monitor treatment for MRSA infections. RESULT CALLED TO, READ BACK BY AND VERIFIED WITH: MIKAYLA HAILEY RN 10/21/20 @1902  BY P.HENDERSON Performed at [facility], [address], [place], [place] [address]          Radiology Studies: DG Chest 1 View  Result Date: 10/21/2020 CLINICAL DATA:  Respiratory distress. EXAM: CHEST  1 VIEW COMPARISON:  October 21, 2020 (10:04 a.m.) FINDINGS: The study is markedly limited secondary to patient rotation. The heart size and mediastinal contours are within normal limits. Moderate to marked severity calcification of the thoracic aorta is seen. Both lungs are clear. Degenerative changes are noted throughout the thoracic spine. IMPRESSION: Stable exam, without evidence of acute cardiopulmonary disease. Electronically Signed   By: Aram Candela M.D.   On: 10/21/2020 15:50   CT HEAD WO CONTRAST  Result Date: 10/21/2020 CLINICAL DATA:  Failure to thrive EXAM: CT HEAD WITHOUT CONTRAST TECHNIQUE: Contiguous axial images were obtained from the base of the skull through the vertex without intravenous contrast. COMPARISON:  None. FINDINGS: Brain: There is no mass, hemorrhage or extra-axial collection. The size and configuration of the ventricles and extra-axial CSF spaces are normal. There is hypoattenuation of the white matter, most commonly indicating chronic small vessel disease. Vascular: No abnormal hyperdensity of the major intracranial arteries or dural venous sinuses. No intracranial atherosclerosis. Skull: The visualized skull base, calvarium and extracranial soft tissues are normal. Sinuses/Orbits: No fluid levels or advanced mucosal thickening of the visualized paranasal sinuses. No mastoid or middle ear effusion. The orbits are normal. 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  Electronically Signed   By: Deatra Robinson M.D.   On: 10/21/2020 20:38   CT ANGIO CHEST PE W OR WO CONTRAST  Result Date: 10/21/2020 CLINICAL DATA:  Hypoxia. EXAM: CT ANGIOGRAPHY CHEST WITH CONTRAST TECHNIQUE: Multidetector CT imaging of the chest was performed using the standard protocol during bolus administration of intravenous contrast. Multiplanar CT image reconstructions and MIPs were obtained to evaluate the vascular anatomy. CONTRAST:  OMNIPAQUE IOHEXOL 350 MG/ML SOLN COMPARISON:  May 05, 2020 FINDINGS: Cardiovascular: There is marked severity calcification of the aortic arch and ascending thoracic aorta which measures 4.6 cm x 4.1 cm. Satisfactory opacification of the pulmonary arteries to the segmental level. No evidence of pulmonary embolism. Normal heart size. No pericardial effusion. Mediastinum/Nodes: No enlarged mediastinal, hilar, or axillary lymph nodes. Thyroid gland, trachea, and esophagus demonstrate no significant findings. Lungs/Pleura: Complete collapse of the right lower lobe is seen with a large amount of debris noted within the lower lobe branch of the bronchus intermedius. There is no evidence of a pleural effusion. A tiny (approximately 5 mm) pneumothorax is seen along the anteromedial aspect of the right apex (axial CT images 24 through 37, CT series number 7). A similar appearing 2 mm pneumothorax is seen along the lateral aspect of the right upper lobe (axial CT image 37, CT series number 7). Upper Abdomen: No acute abnormality. Musculoskeletal: Multilevel degenerative changes seen throughout the thoracic spine. Review of the MIP images confirms the above findings. IMPRESSION: 1. Complete collapse of the right lower lobe with a large amount of debris noted within the lower lobe branch of the bronchus intermedius. 2. Tiny, 5 mm and 2 mm right upper lobe pneumothoraces. 3. Dilated ascending thoracic aorta. 4. Aortic atherosclerosis. Aortic Atherosclerosis (  ICD10-I70.0).  Electronically Signed   By: Aram Candela M.D.   On: 10/21/2020 20:54   DG CHEST PORT 1 VIEW  Result Date: 10/21/2020 CLINICAL DATA:  Cough.  Recent osteomyelitis EXAM: PORTABLE CHEST 1 VIEW COMPARISON:  May 23, 2020. FINDINGS: No edema or airspace opacity. Heart size and contour normal. Pulmonary vascular within normal limits. There is aortic atherosclerosis. No adenopathy. Degenerative change noted in the right shoulder. IMPRESSION: No edema or airspace opacity. Heart size within normal limits. Aortic Atherosclerosis (ICD10-I70.0). Electronically Signed   By: Bretta Bang III M.D.   On: 10/21/2020 11:54   VAS [place] UPPER EXTREMITY VENOUS DUPLEX  Result Date: 10/21/2020 UPPER VENOUS STUDY  Indications: Swelling Risk Factors: DVT Hx DVT lower ext. Comparison Study: 12/21 Negative Performing Technologist: Clint Guy RVT  Examination Guidelines: A complete evaluation includes B-mode imaging, spectral Doppler, color Doppler, and power Doppler as needed of all accessible portions of each vessel. Bilateral testing is considered an integral part of a complete examination. Limited examinations for reoccurring indications may be performed as noted.  Left Findings: +----------+------------+---------+-----------+----------+--------------+ LEFT      CompressiblePhasicitySpontaneousProperties   Summary     +----------+------------+---------+-----------+----------+--------------+ IJV                                                 Not visualized +----------+------------+---------+-----------+----------+--------------+ Subclavian               Yes       Yes                             +----------+------------+---------+-----------+----------+--------------+ Axillary                 Yes       Yes                             +----------+------------+---------+-----------+----------+--------------+ Brachial      Full       Yes       Yes                              +----------+------------+---------+-----------+----------+--------------+ Radial        Full                                                 +----------+------------+---------+-----------+----------+--------------+ Ulnar         Full                                                 +----------+------------+---------+-----------+----------+--------------+ Cephalic      Full                                                 +----------+------------+---------+-----------+----------+--------------+ Basilic  Not visualized +----------+------------+---------+-----------+----------+--------------+  Summary:  Left: No evidence of deep vein thrombosis in the upper extremity. No evidence of superficial vein thrombosis in the upper extremity. However, unable to visualize the IJV and Basilic vein not visualized.  *See table(s) above for measurements and observations.  Diagnosing physician: Coral ElseVance Brabham MD Electronically signed by Coral ElseVance Brabham MD on 10/21/2020 at 4:16:40 PM.    Final         Scheduled Meds: . vitamin C  1,000 mg Oral Daily  . busPIRone  15 mg Oral BID  . Chlorhexidine Gluconate Cloth  6 each Topical Daily  . collagenase   Topical Daily  . donepezil  10 mg Oral QHS  . dronabinol  2.5 mg Oral QHS  . ferrous sulfate  325 mg Oral Q breakfast  . linaclotide  145 mcg Oral Daily  . memantine  28 mg Oral QHS  . mupirocin ointment  1 application Nasal BID   Continuous Infusions: . ceFEPime (MAXIPIME) IV Stopped (10/21/20 2208)  . lactated ringers 75 mL/hr at 10/21/20 2116  . vancomycin Stopped (10/21/20 2343)     LOS: 3 days        Azucena FallenWilliam C Sreeja Spies, DO [facility]   To contact the attending provider between 7A-7P or the covering provider during after hours 7P-7A, please log into the web site [email or web address] and access using universal [place] password for that web site. If you do not have the password,  please call the hospital operator.  10/22/2020, 7:11 AM

## 2020-10-23 DIAGNOSIS — L089 Local infection of the skin and subcutaneous tissue, unspecified: Secondary | ICD-10-CM | POA: Diagnosis not present

## 2020-10-23 DIAGNOSIS — L8994 Pressure ulcer of unspecified site, stage 4: Secondary | ICD-10-CM | POA: Diagnosis not present

## 2020-10-23 LAB — GLUCOSE, CAPILLARY
Glucose-Capillary: 52 mg/dL — ABNORMAL LOW (ref 70–99)
Glucose-Capillary: 63 mg/dL — ABNORMAL LOW (ref 70–99)

## 2020-10-23 LAB — CBC
HCT: 41.5 % (ref 36.0–46.0)
Hemoglobin: 12.3 g/dL (ref 12.0–15.0)
MCH: 31.1 pg (ref 26.0–34.0)
MCHC: 29.6 g/dL — ABNORMAL LOW (ref 30.0–36.0)
MCV: 104.8 fL — ABNORMAL HIGH (ref 80.0–100.0)
Platelets: 116 10*3/uL — ABNORMAL LOW (ref 150–400)
RBC: 3.96 MIL/uL (ref 3.87–5.11)
RDW: 17.9 % — ABNORMAL HIGH (ref 11.5–15.5)
WBC: 8.6 10*3/uL (ref 4.0–10.5)
nRBC: 0.3 % — ABNORMAL HIGH (ref 0.0–0.2)

## 2020-10-23 LAB — COMPREHENSIVE METABOLIC PANEL
ALT: 15 U/L (ref 0–44)
AST: 20 U/L (ref 15–41)
Albumin: 2.1 g/dL — ABNORMAL LOW (ref 3.5–5.0)
Alkaline Phosphatase: 133 U/L — ABNORMAL HIGH (ref 38–126)
Anion gap: 12 (ref 5–15)
BUN: 10 mg/dL (ref 8–23)
CO2: 21 mmol/L — ABNORMAL LOW (ref 22–32)
Calcium: 8.6 mg/dL — ABNORMAL LOW (ref 8.9–10.3)
Chloride: 114 mmol/L — ABNORMAL HIGH (ref 98–111)
Creatinine, Ser: 0.4 mg/dL — ABNORMAL LOW (ref 0.44–1.00)
GFR, Estimated: >60 mL/min (ref >60)
Glucose, Bld: 68 mg/dL — ABNORMAL LOW (ref 70–99)
Potassium: 2.6 mmol/L — CL (ref 3.5–5.1)
Sodium: 147 mmol/L — ABNORMAL HIGH (ref 135–145)
Total Bilirubin: 1.1 mg/dL (ref 0.3–1.2)
Total Protein: 5.8 g/dL — ABNORMAL LOW (ref 6.5–8.1)

## 2020-10-23 LAB — CULTURE, BLOOD (ROUTINE X 2): Special Requests: ADEQUATE

## 2020-10-23 MED ORDER — DEXTROSE 50 % IV SOLN
INTRAVENOUS | Status: AC
  Administered 2020-10-23: 12.5 g via INTRAVENOUS
  Filled 2020-10-23: qty 50

## 2020-10-23 MED ORDER — DEXTROSE 50 % IV SOLN: 12.5000 g | INTRAVENOUS | Status: AC

## 2020-10-23 MED ORDER — POTASSIUM CHLORIDE 20 MEQ PO PACK
60.0000 meq | PACK | Freq: Two times a day (BID) | ORAL | Status: AC
  Administered 2020-10-23: 60 meq via ORAL
  Filled 2020-10-23: qty 3

## 2020-10-23 NOTE — Progress Notes (Signed)
Date and time results received: 10/23/20 1020  Test: Potassium   Critical Value: 2.6  Name of Provider Notified: Dr. Natale Milch  Orders Received? Or Actions Taken?: Replacement potassium given

## 2020-10-23 NOTE — Progress Notes (Signed)
PROGRESS NOTE    [name]  IRJ:[id number] DOB: 1940/11/13 DOA: 10/19/2020 PCP: [name], MD    Chief Complaint  Patient presents with  . 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 On further discussion with the patient's son he reports she takes minimal nutrition orally. On arrival to ED she was found to be severely hyper natremic, and failure to thrive. She was found to have multiple decubitus ulcers on the sacrum and left buttock area. She was empirically started on IV antibiotics for possible infection of the decubitus ulcers. Wound care consulted for recommendations.  Patient initially admitted for what appears to be worsening sacral decubitus ulcer with recent outpatient antibiotics and concern for failure of outpatient antibiotics.  Patient was initially started on empiric IV antibiotics at intake, unfortunately on 10/21/2020 patient had rapid response likely in the setting of aspiration event.  Patient was transferred to the ICU for close monitoring over the weekend, able to be weaned off oxygen over the past 24 hours, mental status is also improving.  Lengthy discussion with son daily about patient's prognosis and goals of care.  Patient has been made DNR as of 10/22/2020 per our discussion.  At this time patient remains high risk for aspiration, speech has cleared for liquid diet at this time and will hopefully continue to advance.  There was previous discussion about possible PEG tube placement, given patient's mental status, baseline and long-term prognosis it is unclear that a PEG tube would be appropriate.  IR had been consulted previously but given patient's  rapid response, unstable nature with occasional ectopy as well as further discussion with family will hold off on any further evaluation for PEG tube placement at this time.   Patient had 1 out of 4 blood cultures positive for staph species, likely contaminant, will continue broad-spectrum antibiotics and follow repeat cultures.  If repeat cultures continue to be positive would consider further gram-positive work-up including but not limited to echo TTE versus TEE as well as ID consult for further evaluation and treatment and medication management.  Assessment & Plan:   Principal Problem:   Pressure injury of skin with infection Active Problems:   HTN (hypertension)   Takotsubo cardiomyopathy   OSA (obstructive sleep apnea)   Cellulitis   Hypernatremia  Infected multiple decubitus ulcer, POA Questionable staph bacteremia (rule out contaminant), likely POA - Empirically on IV vancomycin/cefepime - Follow cultures - will repeat blood cultures -sideline with ID today, if repeat cultures cleared given staph species on blood culture this will likely be considered contaminant.  Hold off on further imaging or work-up at this time. - CT of the abdomen pelvis does not show any acute osteomyelitis at this time. 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 No evidence of significant soft tissue abscess collection. - CT of the lumbar spine shows "Hypertrophic sclerotic changes of the posterior elements at T11, T12 and L1 which could relate to chronic degenerative change, but the possibility of chronic osteomyelitis in that region is not excluded".  Acute hypoxic respiratory failure 2/2 aspiration event, resolving - Likely secondary to aspiration event with notable changes on CTA  10/21/20 without overt PE - Continue supportive care/oxygen supplementation as necessary; wean oxygen as tolerated - Already on broad spectrum antibiotics  as above -Speech continues to follow, currently recommending full liquid diet with medications  Failure to thrive, poor oral intake, AKI and hypovolemic hypernatremia Secondary to poor oral intake and free water deficit - Advance to clear liquid diet per speech today - Previous discussion for G tube placement (per son's request) on hold given patient's tenuous status - Not sure a G-tube is appropriate given patient's history and high risk for complications should she pull it out given baseline dementia -patient son agreeable to hold off on PEG tube placement for now -family's main concern was calorie intake -Consult nutrition/dietary to assist with calorie count given limited p.o. intake will likely need high-calorie supplementation Lab Results  Component Value Date   NA 147 (H) 10/22/2020   K 3.7 10/22/2020   CL 112 (H) 10/22/2020   CO2 25 10/22/2020   Essential hypertension BP parameters are optimal.   Dementia No agitation, pt appears to be at baseline.  On Aricept and Namenda.  History of DVT and pulmonary embolism On Eliquis continue the same.  Hypokalemia Replaced  Hypophosphatemia Replaced  Lactic acidosis Secondary to dehydration, repeat levels tonight.   Mild thrombocytopenia Unclear etiology, continue to monitor. No signs of obvious bleeding.  Pressure injury of skin Pressure Injury 10/21/20 Vertebral column Left (Active)  10/21/20 0845  Location: Vertebral column  Location Orientation: Left  Staging:   Wound Description (Comments):   Present on Admission: Yes   Left arm swollen:  Unclear etiology - possible dependent edema/infiltration of IV Will get venous duplex of the upper extremity. DVT study negative.  DVT prophylaxis: Eliquis Code Status: DNR Family Communication: Lengthy discussion with son over the phone at bedside, encouraged by mother's improvement, lengthy discussion about discontinuation of PEG tube placement and transition to high calorie  intake.  Discussed high risk status, increased morbidity mortality as well as possible lengthy hospital stay given her profound illness and possible disposition to facility pending her needs.  Disposition:  Status is: Inpatient  Remains inpatient appropriate because:Ongoing diagnostic testing needed not appropriate for outpatient work up, Unsafe d/c plan and IV treatments appropriate due to intensity of illness or inability to take PO   Dispo: The patient is from: Home              Anticipated d/c is to: To be determined              Patient currently is not medically stable to d/c.   Difficult to place patient No   Consultants:   IR  Palliative care   Procedures: none.   Antimicrobials:  Antibiotics Given (last 72 hours)    Date/Time Action Medication Dose Rate   10/20/20 1129 New Bag/Given   ceFEPIme (MAXIPIME) 2 g in sodium chloride 0.9 % 100 mL IVPB 2 g 200 mL/hr   10/20/20 2115 New Bag/Given   ceFEPIme (MAXIPIME) 2 g in sodium chloride 0.9 % 100 mL IVPB 2 g 200 mL/hr   10/20/20 2247 New Bag/Given   vancomycin (VANCOREADY) IVPB 1250 mg/250 mL 1,250 mg 166.7 mL/hr   10/21/20 1002 New Bag/Given   ceFEPIme (MAXIPIME) 2 g in sodium chloride 0.9 % 100 mL IVPB 2 g 200 mL/hr   10/21/20 2138 New Bag/Given   ceFEPIme (MAXIPIME) 2 g in sodium chloride 0.9 % 100 mL IVPB 2 g 200 mL/hr   10/21/20 2242 New Bag/Given  vancomycin (VANCOREADY) IVPB 750 mg/150 mL 750 mg 150 mL/hr   10/22/20 1031 New Bag/Given   ceFEPIme (MAXIPIME) 2 g in sodium chloride 0.9 % 100 mL IVPB 2 g 200 mL/hr   10/22/20 2257 New Bag/Given   ceFEPIme (MAXIPIME) 2 g in sodium chloride 0.9 % 100 mL IVPB 2 g 200 mL/hr   10/22/20 2301 New Bag/Given   vancomycin (VANCOREADY) IVPB 750 mg/150 mL 750 mg 150 mL/hr        Subjective: No acute issues or events overnight, patient much more awake alert oriented this morning, able to answer questions more appropriately denies chest pain, shortness of breath, nausea,  vomiting, diarrhea, constant but does indicate ongoing back pain with moderate improvement after medication administration..  Objective: Vitals:   10/22/20 2300 10/23/20 0000 10/23/20 0100 10/23/20 0500  BP: (!) 144/75 (!) 152/75    Pulse: 77 68    Resp: (!) 8 (!) 9    Temp:   97.9 F (36.6 C) 98 F (36.7 C)  TempSrc:   Oral Oral  SpO2: 100% 100%    Weight:      Height:        Intake/Output Summary (Last 24 hours) at 10/23/2020 0705 Last data filed at 10/23/2020 0007 Gross per 24 hour  Intake 2988.2 ml  Output 550 ml  Net 2438.2 ml   Filed Weights   10/19/20 2107  Weight: 61.3 kg   Examination:  General exam: Alert and comfortable, no acute distress Respiratory system: Diminished respiratory effort without overt wheezes rhonchi or rales. Cardiovascular system: S1-S2 heard, regular rate rhythm, no JVD Gastrointestinal system: Abdomen is soft nontender bowel sounds normal Central nervous system: Alert and able to answer simple questions Extremities: Left upper extremity edema present 2+  Skin: Multiple decubitus ulcers on the sacrum and left buttock  Data Reviewed: I have personally reviewed following labs and imaging studies  CBC: Recent Labs  Lab 10/19/20 1611 10/19/20 1711 10/21/20 0553  WBC 11.2*  --  10.9*  NEUTROABS 8.4*  --   --   HGB 11.7* 12.2 10.6*  HCT 40.2 36.0 36.0  MCV 107.2*  --  105.6*  PLT 121*  --  126*   Basic Metabolic Panel: Recent Labs  Lab 10/20/20 0509 10/20/20 0950 10/20/20 2030 10/21/20 0553 10/22/20 0326  NA 154* 150* 147* 146* 147*  K 3.4* 3.0* 4.0 3.8 3.7  CL 117* 117* 112* 111 112*  CO2 27 26 26 24 25   GLUCOSE 136* 184* 95 97 88  BUN 15 13 12 12 12   CREATININE 0.47 0.47 0.48 0.46 0.48  CALCIUM 8.6* 8.8* 8.2* 8.5* 8.8*  MG  --  2.2  --  2.2 2.2  PHOS  --  2.0*  --  4.1  --    GFR: Estimated Creatinine Clearance: 48.3 mL/min (by C-G formula based on SCr of 0.48 mg/dL).  Liver Function Tests: No results for input(s):  AST, ALT, ALKPHOS, BILITOT, PROT, ALBUMIN in the last 168 hours.  CBG: Recent Labs  Lab 10/21/20 1608  GLUCAP 118*     Recent Results (from the past 240 hour(s))  Blood culture (routine x 2)     Status: None (Preliminary result)   Collection Time: 10/19/20  5:02 PM   Specimen: BLOOD LEFT FOREARM  Result Value Ref Range Status   Specimen Description   Final    BLOOD LEFT FOREARM Performed at [facility], [address] [place], [place] [address]    Special Requests  Final    BOTTLES DRAWN AEROBIC AND ANAEROBIC Blood Culture results may not be optimal due to an excessive volume of blood received in culture bottles Performed at [facility], [address] [place], [place] [address]    Culture   Final    NO GROWTH 3 DAYS Performed at [facility], [address], [place], [place] [address]    Report Status PENDING  Incomplete  SARS CORONAVIRUS 2 (TAT 6-24 HRS) Nasopharyngeal Nasopharyngeal Swab     Status: None   Collection Time: 10/19/20  5:14 PM   Specimen: Nasopharyngeal Swab  Result Value Ref Range Status   SARS Coronavirus 2 NEGATIVE NEGATIVE Final    Comment: (NOTE) SARS-CoV-2 target nucleic acids are NOT DETECTED.  The SARS-CoV-2 RNA is generally detectable in upper and lower respiratory specimens during the acute phase of infection. Negative results do not preclude SARS-CoV-2 infection, do not rule out co-infections with other pathogens, and should not be used as the sole basis for treatment or other patient management decisions. Negative results must be combined with clinical observations, patient history, and epidemiological information. The expected result is Negative.  Fact Sheet for Patients: HairSlick.no  Fact Sheet for Healthcare Providers: quierodirigir.com  This test is not yet approved or cleared by the [place] FDA and  has been authorized for  detection and/or diagnosis of SARS-CoV-2 by FDA under an Emergency Use Authorization (EUA). This EUA will remain  in effect (meaning this test can be used) for the duration of the COVID-19 declaration under Se ction 564(b)(1) of the Act, 21 U.S.C. section 360bbb-3(b)(1), unless the authorization is terminated or revoked sooner.  Performed at [facility], [address], [place], [place] [address]   Blood culture (routine x 2)     Status: None (Preliminary result)   Collection Time: 10/19/20  9:42 PM   Specimen: BLOOD  Result Value Ref Range Status   Specimen Description   Final    BLOOD BLOOD RIGHT HAND Performed at [facility], [address] [place], [place] [address]    Special Requests   Final    BOTTLES DRAWN AEROBIC ONLY Blood Culture adequate volume Performed at [facility], [address], [place], [place] [address]    Culture  Setup Time   Final    GRAM POSITIVE COCCI IN CLUSTERS AEROBIC BOTTLE ONLY CRITICAL RESULT CALLED TO, READ BACK BY AND VERIFIED WITH: PHARMD ANH PHAM BY MESSAN H. AT [id number] ON 10/21/2020    Culture   Final    GRAM POSITIVE COCCI IDENTIFICATION TO FOLLOW Performed at [facility], [address], [place], [place] [address]    Report Status PENDING  Incomplete  Blood Culture ID Panel (Reflexed)     Status: Abnormal   Collection Time: 10/19/20  9:42 PM  Result Value Ref Range Status   Enterococcus faecalis NOT DETECTED NOT DETECTED Final   Enterococcus Faecium NOT DETECTED NOT DETECTED Final   Listeria monocytogenes NOT DETECTED NOT DETECTED Final   Staphylococcus species DETECTED (A) NOT DETECTED Final    Comment: CRITICAL RESULT CALLED TO, READ BACK BY AND VERIFIED WITH: PHARMD ANH PHAM BY MESSAN H. AT [id number] ON 10/21/2020    Staphylococcus aureus (BCID) NOT DETECTED NOT DETECTED Final   Staphylococcus epidermidis NOT DETECTED NOT DETECTED Final   Staphylococcus lugdunensis NOT DETECTED NOT  DETECTED Final   Streptococcus species NOT DETECTED NOT DETECTED Final   Streptococcus agalactiae NOT DETECTED NOT  DETECTED Final   Streptococcus pneumoniae NOT DETECTED NOT DETECTED Final   Streptococcus pyogenes NOT DETECTED NOT DETECTED Final   A.calcoaceticus-baumannii NOT DETECTED NOT DETECTED Final   Bacteroides fragilis NOT DETECTED NOT DETECTED Final   Enterobacterales NOT DETECTED NOT DETECTED Final   Enterobacter cloacae complex NOT DETECTED NOT DETECTED Final   Escherichia coli NOT DETECTED NOT DETECTED Final   Klebsiella aerogenes NOT DETECTED NOT DETECTED Final   Klebsiella oxytoca NOT DETECTED NOT DETECTED Final   Klebsiella pneumoniae NOT DETECTED NOT DETECTED Final   Proteus species NOT DETECTED NOT DETECTED Final   Salmonella species NOT DETECTED NOT DETECTED Final   Serratia marcescens NOT DETECTED NOT DETECTED Final   Haemophilus influenzae NOT DETECTED NOT DETECTED Final   Neisseria meningitidis NOT DETECTED NOT DETECTED Final   Pseudomonas aeruginosa NOT DETECTED NOT DETECTED Final   Stenotrophomonas maltophilia NOT DETECTED NOT DETECTED Final   Candida albicans NOT DETECTED NOT DETECTED Final   Candida auris NOT DETECTED NOT DETECTED Final   Candida glabrata NOT DETECTED NOT DETECTED Final   Candida krusei NOT DETECTED NOT DETECTED Final   Candida parapsilosis NOT DETECTED NOT DETECTED Final   Candida tropicalis NOT DETECTED NOT DETECTED Final   Cryptococcus neoformans/gattii NOT DETECTED NOT DETECTED Final    Comment: Performed at [facility], [address], [place], [place] [address]  MRSA PCR Screening     Status: Abnormal   Collection Time: 10/21/20  4:15 PM   Specimen: Nasopharyngeal  Result Value Ref Range Status   MRSA by PCR POSITIVE (A) NEGATIVE Final    Comment:        The GeneXpert MRSA Assay (FDA approved for NASAL specimens only), is one component of a comprehensive MRSA colonization surveillance program. It is not intended to diagnose  MRSA infection nor to guide or monitor treatment for MRSA infections. RESULT CALLED TO, READ BACK BY AND VERIFIED WITH: MIKAYLA HAILEY RN 10/21/20 @1902  BY P.HENDERSON Performed at [facility], [address], [place], [place] [address]          Radiology Studies: DG Chest 1 View  Result Date: 10/21/2020 CLINICAL DATA:  Respiratory distress. EXAM: CHEST  1 VIEW COMPARISON:  October 21, 2020 (10:04 a.m.) FINDINGS: The study is markedly limited secondary to patient rotation. The heart size and mediastinal contours are within normal limits. Moderate to marked severity calcification of the thoracic aorta is seen. Both lungs are clear. Degenerative changes are noted throughout the thoracic spine. IMPRESSION: Stable exam, without evidence of acute cardiopulmonary disease. Electronically Signed   By: Aram Candela M.D.   On: 10/21/2020 15:50   CT HEAD WO CONTRAST  Result Date: 10/21/2020 CLINICAL DATA:  Failure to thrive EXAM: CT HEAD WITHOUT CONTRAST TECHNIQUE: Contiguous axial images were obtained from the base of the skull through the vertex without intravenous contrast. COMPARISON:  None. FINDINGS: Brain: There is no mass, hemorrhage or extra-axial collection. The size and configuration of the ventricles and extra-axial CSF spaces are normal. There is hypoattenuation of the white matter, most commonly indicating chronic small vessel disease. Vascular: No abnormal hyperdensity of the major intracranial arteries or dural venous sinuses. No intracranial atherosclerosis. Skull: The visualized skull base, calvarium and extracranial soft tissues are normal. Sinuses/Orbits: No fluid levels or advanced mucosal thickening of the visualized paranasal sinuses. No mastoid or middle ear effusion. The orbits are normal. 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 Electronically Signed   By: Chrisandra Netters.D.  On: 10/21/2020 20:38   CT ANGIO CHEST PE  W OR WO CONTRAST  Result Date: 10/21/2020 CLINICAL DATA:  Hypoxia. EXAM: CT ANGIOGRAPHY CHEST WITH CONTRAST TECHNIQUE: Multidetector CT imaging of the chest was performed using the standard protocol during bolus administration of intravenous contrast. Multiplanar CT image reconstructions and MIPs were obtained to evaluate the vascular anatomy. CONTRAST:  OMNIPAQUE IOHEXOL 350 MG/ML SOLN COMPARISON:  May 05, 2020 FINDINGS: Cardiovascular: There is marked severity calcification of the aortic arch and ascending thoracic aorta which measures 4.6 cm x 4.1 cm. Satisfactory opacification of the pulmonary arteries to the segmental level. No evidence of pulmonary embolism. Normal heart size. No pericardial effusion. Mediastinum/Nodes: No enlarged mediastinal, hilar, or axillary lymph nodes. Thyroid gland, trachea, and esophagus demonstrate no significant findings. Lungs/Pleura: Complete collapse of the right lower lobe is seen with a large amount of debris noted within the lower lobe branch of the bronchus intermedius. There is no evidence of a pleural effusion. A tiny (approximately 5 mm) pneumothorax is seen along the anteromedial aspect of the right apex (axial CT images 24 through 37, CT series number 7). A similar appearing 2 mm pneumothorax is seen along the lateral aspect of the right upper lobe (axial CT image 37, CT series number 7). Upper Abdomen: No acute abnormality. Musculoskeletal: Multilevel degenerative changes seen throughout the thoracic spine. Review of the MIP images confirms the above findings. IMPRESSION: 1. Complete collapse of the right lower lobe with a large amount of debris noted within the lower lobe branch of the bronchus intermedius. 2. Tiny, 5 mm and 2 mm right upper lobe pneumothoraces. 3. Dilated ascending thoracic aorta. 4. Aortic atherosclerosis. Aortic Atherosclerosis (ICD10-I70.0). Electronically Signed   By: Aram Candela M.D.   On: 10/21/2020 20:54   DG CHEST PORT 1  VIEW  Result Date: 10/21/2020 CLINICAL DATA:  Cough.  Recent osteomyelitis EXAM: PORTABLE CHEST 1 VIEW COMPARISON:  May 23, 2020. FINDINGS: No edema or airspace opacity. Heart size and contour normal. Pulmonary vascular within normal limits. There is aortic atherosclerosis. No adenopathy. Degenerative change noted in the right shoulder. IMPRESSION: No edema or airspace opacity. Heart size within normal limits. Aortic Atherosclerosis (ICD10-I70.0). Electronically Signed   By: Bretta Bang III M.D.   On: 10/21/2020 11:54   VAS [place] UPPER EXTREMITY VENOUS DUPLEX  Result Date: 10/21/2020 UPPER VENOUS STUDY  Indications: Swelling Risk Factors: DVT Hx DVT lower ext. Comparison Study: 12/21 Negative Performing Technologist: Clint Guy RVT  Examination Guidelines: A complete evaluation includes B-mode imaging, spectral Doppler, color Doppler, and power Doppler as needed of all accessible portions of each vessel. Bilateral testing is considered an integral part of a complete examination. Limited examinations for reoccurring indications may be performed as noted.  Left Findings: +----------+------------+---------+-----------+----------+--------------+ LEFT      CompressiblePhasicitySpontaneousProperties   Summary     +----------+------------+---------+-----------+----------+--------------+ IJV                                                 Not visualized +----------+------------+---------+-----------+----------+--------------+ Subclavian               Yes       Yes                             +----------+------------+---------+-----------+----------+--------------+ Axillary  Yes       Yes                             +----------+------------+---------+-----------+----------+--------------+ Brachial      Full       Yes       Yes                             +----------+------------+---------+-----------+----------+--------------+ Radial        Full                                                  +----------+------------+---------+-----------+----------+--------------+ Ulnar         Full                                                 +----------+------------+---------+-----------+----------+--------------+ Cephalic      Full                                                 +----------+------------+---------+-----------+----------+--------------+ Basilic                                             Not visualized +----------+------------+---------+-----------+----------+--------------+  Summary:  Left: No evidence of deep vein thrombosis in the upper extremity. No evidence of superficial vein thrombosis in the upper extremity. However, unable to visualize the IJV and Basilic vein not visualized.  *See table(s) above for measurements and observations.  Diagnosing physician: Coral Else MD Electronically signed by Coral Else MD on 10/21/2020 at 4:16:40 PM.    Final    Scheduled Meds: . vitamin C  1,000 mg Oral Daily  . busPIRone  15 mg Oral BID  . Chlorhexidine Gluconate Cloth  6 each Topical Daily  . collagenase   Topical Daily  . donepezil  10 mg Oral QHS  . dronabinol  2.5 mg Oral QHS  . ferrous sulfate  325 mg Oral Q breakfast  . linaclotide  145 mcg Oral Daily  . mouth rinse  15 mL Mouth Rinse BID  . memantine  28 mg Oral QHS  . mupirocin ointment  1 application Nasal BID   Continuous Infusions: . ceFEPime (MAXIPIME) IV Stopped (10/22/20 2327)  . lactated ringers 75 mL/hr at 10/23/20 0007  . vancomycin Stopped (10/23/20 0001)     LOS: 4 days   Azucena Fallen, DO [facility] Pager: Secure chat  If between 7 PM and 7 AM please contact overnight coverage  10/23/2020, 7:05 AM

## 2020-10-23 NOTE — TOC Initial Note (Signed)
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 On further discussion with the patient's son he reports she takes minimal nutrition orally. On arrival to ED she was found to be severely hyper natremic, and failure to thrive. She was found to have multiple decubitus ulcers on the sacrum and left buttock area. She was empirically started on IV antibiotics for possible infection of the decubitus ulcers. Wound care consulted for recommendations.  Assessment & Plan:   Principal Problem:   Pressure injury of skin with infection Active Problems:   HTN (hypertension)   Takotsubo cardiomyopathy   OSA (obstructive sleep apnea)   Cellulitis   Hypernatremia  Infected multiple decubitus ulcer Concurrent suspected staph bacteremia (rule out contaminant) - Empirically on IV vancomycin/cefepime - Follow cultures - will repeat blood cultures until cleared, follow up with ID for possible further imaging/echo/repeat cultures/and antibiotic narrowing as appropriate - CT of the abdomen pelvis does not show any acute osteomyelitis at this time. 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 No evidence of significant soft tissue abscess collection. - CT of the lumbar spine shows Hypertrophic sclerotic changes of the posterior elements at T11, T12 and L1 which could relate to chronic  degenerative change, but the possibility of chronic osteomyelitis in that region is not excluded. There is no gross destructive bone change. - There is increased kyphotic curvature in that region which probably places the overlying soft tissue at risk. There is no evidence of paraspinous soft tissue abscess. The posterior elements of T11, T12 and L1 are closely related to the area of decubitus ulcer.  Acute hypoxic respiratory failure 2/2 aspiration event - Likely secondary to aspiration event - notable changes on CTA 10/21/20 without overt PE - Continue supportive care/oxygen supplementation as necessary; wean oxygen as tolerated - Already on broad spectrum antibiotics as above  Failure to thrive, poor oral intake, AKI and hypovolemic hypernatremia Secondary to poor oral intake and free water deficit - Continues to be NPO currently per SLP - unsafe to feed; previous discussion for G tube placement (per son's request) on hold given patient's tenuous status - Not sure a G-tube is appropriate given patient's history and high risk for complications should she pull it out given baseline dementia PLAN: may need snf placement st rehab at this time lives with son. Expected Discharge Plan: Skilled Nursing Facility Barriers to Discharge: Continued Medical Work up   Patient Goals and CMS Choice Patient states their goals for this hospitalization and ongoing recovery are:: unable to state CMS Medicare.gov Compare Post Acute Care list provided to:: Patient Represenative (must comment) (son) Choice offered to / list presented to : Adult Children  Expected Discharge Plan and Services Expected Discharge Plan: Skilled Nursing Facility   Discharge Planning Services: CM Consult   Living arrangements for the past 2 months: Single Family Home  Prior Living Arrangements/Services Living arrangements for the past 2 months: Single Family Home Lives with:: Adult  Children          Need for Family Participation in Patient Care: Yes (Comment) Care giver support system in place?: Yes (comment) (son)   Criminal Activity/Legal Involvement Pertinent to Current Situation/Hospitalization: No - Comment as needed  Activities of Daily Living Home Assistive Devices/Equipment: Environmental consultant (specify type),Eyeglasses ADL Screening (condition at time of admission) Patient's cognitive ability adequate to safely complete daily activities?: Yes Is the patient deaf or have difficulty hearing?: No Does the patient have difficulty seeing, even when wearing glasses/contacts?: No Does the patient have difficulty concentrating, remembering, or making decisions?: Yes Patient able to express need for assistance with ADLs?: Yes Does the patient have difficulty dressing or bathing?: No Independently performs ADLs?: No Communication: Independent Dressing (OT): Independent Grooming: Independent Feeding: Independent Bathing: Independent Toileting: Needs assistance Is this a change from baseline?: Pre-admission baseline In/Out Bed: Needs assistance Is this a change from baseline?: Pre-admission baseline Walks in Home: Needs assistance Is this a change from baseline?: Pre-admission baseline Does the patient have difficulty walking or climbing stairs?: Yes Weakness of Legs: Both Weakness of Arms/Hands: Both  Permission Sought/Granted                  Emotional Assessment Appearance:: Appears stated age Attitude/Demeanor/Rapport: Unable to Assess Affect (typically observed): Unable to Assess Orientation: : Fluctuating Orientation (Suspected and/or reported Sundowners) Alcohol / Substance Use: Not Applicable Psych Involvement: No (comment)  Admission diagnosis:  Hypokalemia [E87.6] Cellulitis [L03.90] Hypernatremia [E87.0] Pressure ulcer [L89.90] Infected decubitus ulcer, unspecified ulcer stage [L89.90, L08.9] Patient Active Problem List   Diagnosis Date Noted   . Cellulitis 10/19/2020  . Hypernatremia 10/19/2020  . Pressure injury of skin with infection 10/19/2020  . Right knee pain 08/03/2020  . Anemia 08/01/2020  . Osteomyelitis (HCC) 07/23/2020  . Hardware complicating wound infection (HCC) 07/23/2020  . Back pain   . Pressure injury of skin   . Hypocalcemia 07/21/2020  . Palliative care by specialist   . Goals of care, counseling/discussion   . Protein-calorie malnutrition, severe 05/25/2020  . Acute renal failure (ARF) (HCC) 05/24/2020  . 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 Hyperkalemia   . Edema of left upper extremity   . Hematoma   . Prolonged Q-T interval on ECG   . Acute blood loss anemia   . Transaminitis   . Chronic bilateral low back pain without sciatica   . Debility 05/13/2020  . Cellulitis of right hip 05/10/2020  . Elevated troponin 05/05/2020  . Near syncope 05/05/2020  . Pulmonary embolism (HCC) 05/05/2020  . Macrocytic anemia   . Hypokalemia   . Labile blood pressure   . Drug-induced hypotension   . History of hypertension   . First degree AV block   . Sleep disturbance   . Drug induced constipation   . Stage 2 chronic kidney disease   . Dysphagia, post-stroke   . Benign essential HTN   . Slow transit constipation   . 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 Allergic reaction caused by a drug (tPA w/ lip swelling) 02/21/2020  . 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 Dementia (HCC) 02/21/2020  . Bradycardia 02/21/2020  . AKI (acute kidney injury) (HCC) 02/21/2020  . 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 Dyslipidemia   . History of DVT (deep vein thrombosis)   .  OSA (obstructive sleep apnea)   . Leukocytosis   . Stroke [facility]) L>R pontine and L cerebellar embolic infarcts, source unknown s/p tPA 02/16/2020  . Hx-TIA (transient ischemic attack)   . Hyperlipemia   . Hypertension   . Sleep apnea   .  History of pulmonary embolus (PE)   . NSTEMI (non-ST elevated myocardial infarction) (HCC) 09/16/2019  . 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 Depression 07/29/2018  . Acute massive pulmonary embolism (HCC) 09/29/2016  . Acute encephalopathy 09/28/2016  . Left knee pain 09/28/2016  . Right leg swelling 09/28/2016  . Hilar mass 09/28/2016  . OSA on CPAP 09/28/2016  . HTN (hypertension) 09/28/2016  . Chronic pain 09/28/2016  . Cellulitis of left leg 06/14/2016  . Osteoarthritis of left knee 05/17/2016  . 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 [place] [address] Phone: [phone] Fax: [phone]     Social Determinants of Health (SDOH) Interventions    Readmission Risk Interventions No flowsheet data found.

## 2020-10-23 NOTE — Progress Notes (Signed)
  Speech Language Pathology Treatment: Dysphagia  Patient Details Name: [name] MRN: [id number] DOB: 10-Feb-1941 Today's Date: 10/23/2020 Time: 6712-4580 SLP Time Calculation (min) (ACUTE ONLY): 25 min  Assessment / Plan / Recommendation Clinical Impression  Pt seen at bedside for follow up after decline in status over the weekend. Pt was awake and alert, leaning hard to the right, which was not observed during BSE 10/20/20. Oral care was completed with suction, following which pt accepted trials of ice chips, thin liquid, and nectar thick liquid (ice cream). Pt exhibited better oral manipulation and much more prompt oral clearing on these textures today, and did not require cues to swallow. No overt s/s aspiration observed on any consistency given. Recommend resume full liquid diet, providing to pt only when she is fully awake, alert, and seated as upright as possible. RN and MD informed. Safe swallow precautions posted at [facility]. SLP will continue to follow acutely to assess diet tolerance and provide ongoing education.    HPI HPI: [name] admitted 10/19/20 due to discharge from decubitus ulcer. 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 Pt known to ST services      SLP Plan  Continue with current plan of care       Recommendations  Diet recommendations: Other(comment) (full liquid) Liquids provided via: Straw Medication Administration: Whole meds with liquid Supervision: Full supervision/cueing for compensatory strategies;Staff to assist with self feeding Compensations: Minimize environmental distractions;Slow rate;Small sips/bites Postural Changes and/or Swallow Maneuvers: Seated upright 90 degrees;Upright 30-60 min after meal                Oral Care Recommendations: Oral care QID;Oral care before and after PO;Staff/trained caregiver to provide oral care Follow up Recommendations: 24 hour supervision/assistance SLP Visit Diagnosis: Dysphagia, oropharyngeal  phase (R13.12) Plan: Continue with current plan of care       GO               Azion Centrella B. Murvin Natal, [facility], CCC-SLP Speech Language Pathologist Office: [phone] Pager: [phone]  Leigh Aurora 10/23/2020, 9:41 AM

## 2020-10-24 DIAGNOSIS — I5181 Takotsubo syndrome: Secondary | ICD-10-CM

## 2020-10-24 DIAGNOSIS — I10 Essential (primary) hypertension: Secondary | ICD-10-CM | POA: Diagnosis not present

## 2020-10-24 DIAGNOSIS — E870 Hyperosmolality and hypernatremia: Secondary | ICD-10-CM | POA: Diagnosis not present

## 2020-10-24 DIAGNOSIS — L8994 Pressure ulcer of unspecified site, stage 4: Secondary | ICD-10-CM | POA: Diagnosis not present

## 2020-10-24 DIAGNOSIS — G4733 Obstructive sleep apnea (adult) (pediatric): Secondary | ICD-10-CM | POA: Diagnosis not present

## 2020-10-24 LAB — CBC
HCT: 30.7 % — ABNORMAL LOW (ref 36.0–46.0)
Hemoglobin: 9.5 g/dL — ABNORMAL LOW (ref 12.0–15.0)
MCH: 31.1 pg (ref 26.0–34.0)
MCHC: 30.9 g/dL (ref 30.0–36.0)
MCV: 100.7 fL — ABNORMAL HIGH (ref 80.0–100.0)
Platelets: 102 10*3/uL — ABNORMAL LOW (ref 150–400)
RBC: 3.05 MIL/uL — ABNORMAL LOW (ref 3.87–5.11)
RDW: 17.8 % — ABNORMAL HIGH (ref 11.5–15.5)
WBC: 7.5 10*3/uL (ref 4.0–10.5)
nRBC: 0.3 % — ABNORMAL HIGH (ref 0.0–0.2)

## 2020-10-24 LAB — COMPREHENSIVE METABOLIC PANEL
ALT: 15 U/L (ref 0–44)
AST: 20 U/L (ref 15–41)
Albumin: 1.9 g/dL — ABNORMAL LOW (ref 3.5–5.0)
Alkaline Phosphatase: 123 U/L (ref 38–126)
Anion gap: 9 (ref 5–15)
BUN: 10 mg/dL (ref 8–23)
CO2: 23 mmol/L (ref 22–32)
Calcium: 8.6 mg/dL — ABNORMAL LOW (ref 8.9–10.3)
Chloride: 114 mmol/L — ABNORMAL HIGH (ref 98–111)
Creatinine, Ser: 0.38 mg/dL — ABNORMAL LOW (ref 0.44–1.00)
GFR, Estimated: >60 mL/min (ref >60)
Glucose, Bld: 69 mg/dL — ABNORMAL LOW (ref 70–99)
Potassium: 2.8 mmol/L — ABNORMAL LOW (ref 3.5–5.1)
Sodium: 146 mmol/L — ABNORMAL HIGH (ref 135–145)
Total Bilirubin: 1.1 mg/dL (ref 0.3–1.2)
Total Protein: 5.4 g/dL — ABNORMAL LOW (ref 6.5–8.1)

## 2020-10-24 LAB — CULTURE, BLOOD (ROUTINE X 2): Culture: NO GROWTH

## 2020-10-24 LAB — GLUCOSE, CAPILLARY
Glucose-Capillary: 84 mg/dL (ref 70–99)
Glucose-Capillary: 88 mg/dL (ref 70–99)

## 2020-10-24 MED ORDER — ADULT MULTIVITAMIN W/MINERALS CH
1.0000 | ORAL_TABLET | Freq: Every day | ORAL | Status: DC
  Administered 2020-10-25 – 2020-11-02 (×7): 1 via ORAL
  Filled 2020-10-24 (×8): qty 1

## 2020-10-24 MED ORDER — POTASSIUM CHLORIDE 10 MEQ/100ML IV SOLN
10.0000 meq | INTRAVENOUS | Status: AC
  Administered 2020-10-24 (×5): 10 meq via INTRAVENOUS
  Filled 2020-10-24 (×4): qty 100

## 2020-10-24 MED ORDER — POTASSIUM CHLORIDE 20 MEQ PO PACK
20.0000 meq | PACK | Freq: Four times a day (QID) | ORAL | Status: AC
  Administered 2020-10-24 (×4): 20 meq via ORAL
  Filled 2020-10-24 (×4): qty 1

## 2020-10-24 MED ORDER — PROSOURCE PLUS PO LIQD
30.0000 mL | Freq: Two times a day (BID) | ORAL | Status: DC
  Administered 2020-10-24 – 2020-10-29 (×10): 30 mL via ORAL
  Filled 2020-10-24 (×11): qty 30

## 2020-10-24 MED ORDER — APIXABAN 5 MG PO TABS
5.0000 mg | ORAL_TABLET | Freq: Two times a day (BID) | ORAL | Status: DC
  Administered 2020-10-24 (×2): 5 mg via ORAL
  Filled 2020-10-24 (×2): qty 1

## 2020-10-24 MED ORDER — BOOST / RESOURCE BREEZE PO LIQD CUSTOM
1.0000 | Freq: Two times a day (BID) | ORAL | Status: DC
  Administered 2020-10-24 – 2020-11-07 (×19): 1 via ORAL

## 2020-10-24 MED ORDER — SODIUM CHLORIDE 0.9% FLUSH
10.0000 mL | INTRAVENOUS | Status: DC | PRN
Start: 2020-10-24 — End: 2020-11-07

## 2020-10-24 MED ORDER — SODIUM CHLORIDE 0.9% FLUSH
10.0000 mL | Freq: Two times a day (BID) | INTRAVENOUS | Status: DC
  Administered 2020-10-24: 10 mL
  Administered 2020-10-25 (×2): 20 mL
  Administered 2020-10-26 – 2020-11-07 (×13): 10 mL

## 2020-10-24 NOTE — Progress Notes (Signed)
Pharmacy Antibiotic Note  [name] is a 80 y.o. 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 She was started on vancomycin and zosyn on admission for suspected wound infection.  Today, 10/24/2020: - day #5 abx - Afeb, wbc 7.5 - scr stable, < 1  - 1 blood cx bottle collected on 3/10 has staph species - Repeat culture has been ordered and drawn 3/15   Plan: - vancomycin dose to 750 mg IV q24h for est AUC 414 - cefepime 2gm IV q12h - f/u LOT of abx  ________________________________________  Height: 5\' 2"  (157.5 cm) Weight: 61.3 kg (135 lb 2.3 oz) IBW/kg (Calculated) : 50.1  Temp (24hrs), Avg:98.3 F (36.8 C), Min:97.5 F (36.4 C), Max:98.7 F (37.1 C)  Recent Labs  Lab 10/19/20 1611 10/19/20 1711 10/19/20 2142 10/20/20 0509 10/20/20 0950 10/20/20 2030 10/21/20 0553 10/21/20 1644 10/22/20 0326 10/23/20 0912 10/24/20 0302  WBC 11.2*  --   --   --   --   --  10.9*  --   --  8.6 7.5  CREATININE  --    < >  --  0.47 0.47 0.48 0.46  --  0.48 0.40* 0.38*  LATICACIDVEN 1.5  --  2.7* 1.9 2.0*  --   --  2.8*  --   --   --    < > = values in this interval not displayed.    Estimated Creatinine Clearance: 48.3 mL/min (A) (by C-G formula based on SCr of 0.38 mg/dL (L)).    Allergies  Allergen Reactions  . T-Pa [Alteplase] Swelling    Does okay with it if premedicated with benadryl.  . Codeine Itching     Thank you for allowing pharmacy to be a part of this patient's care.  10/26/20, PharmD, BCPS 10/24/2020 12:00 PM

## 2020-10-24 NOTE — Progress Notes (Signed)
PROGRESS NOTE    [name]  VOZ:[id number] DOB: Oct 30, 1940 DOA: 10/19/2020 PCP: [name], MD    Chief Complaint  Patient presents with  . 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 On further discussion with the patient's son he reports she takes minimal nutrition orally. On arrival to ED she was found to be severely hyper natremic, and failure to thrive. She was found to have multiple decubitus ulcers on the sacrum and left buttock area. She was empirically started on IV antibiotics for possible infection of the decubitus ulcers. Wound care consulted for recommendations.  Patient initially admitted for what appears to be worsening sacral decubitus ulcer with recent outpatient antibiotics and concern for failure of outpatient antibiotics.  Patient was initially started on empiric IV antibiotics at intake, unfortunately on 10/21/2020 patient had rapid response likely in the setting of aspiration event.  Patient was transferred to the ICU for close monitoring over the weekend, able to be weaned off oxygen over the past 24 hours, mental status is also improving.  Lengthy discussion with son daily about patient's prognosis and goals of care.  Patient has been made DNR as of 10/22/2020 per our discussion.  At this time patient remains high risk for aspiration, speech has cleared for liquid diet at this time and will hopefully continue to advance.  There was previous discussion about possible PEG tube placement, given patient's mental status, baseline and long-term prognosis it is unclear that a PEG tube would be appropriate.  IR had been consulted previously but given patient's  rapid response, unstable nature with occasional ectopy as well as further discussion with family will hold off on any further evaluation for PEG tube placement at this time.   Patient had 1 out of 4 blood cultures positive for staph species, likely contaminant, will continue broad-spectrum antibiotics and follow repeat cultures.  If repeat cultures continue to be positive would consider further gram-positive work-up including but not limited to echo TTE versus TEE as well as ID consult for further evaluation and treatment and medication management.  Assessment & Plan:   Principal Problem:   Pressure injury of skin with infection Active Problems:   HTN (hypertension)   Takotsubo cardiomyopathy   OSA (obstructive sleep apnea)   Cellulitis   Hypernatremia  Infected multiple decubitus ulcer, POA Questionable staph bacteremia (rule out contaminant), likely POA - Empirically on IV vancomycin/cefepime - Follow cultures -follow repeat blood cultures -discussed with ID in passing, if repeat cultures cleared given staph species on blood culture this will likely be considered contaminant.  Hold off on further imaging or work-up at this time. - CT of the abdomen pelvis does not show any acute osteomyelitis at this time. 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 No evidence of significant soft tissue abscess collection. CT could not exclude chronic osteomyelitis.  Acute hypoxic respiratory failure 2/2 aspiration event, resolving - Likely secondary to aspiration event with notable changes on CTA 10/21/20 without overt PE - Continue supportive care/oxygen supplementation as necessary; wean oxygen as tolerated - Already on broad spectrum antibiotics as above -Speech continues to follow, currently recommending full liquid diet with  medications  Acute metabolic encephalopathy, resolving  Chronic dementia, stable without behavioral  disturbances  -Patient much more awake alert and orientable today -Baseline appears to be alert and oriented to person place and general situation/typically able to recognize family at bedside -Answering questions appropriately -On Aricept and Namenda.  Failure to thrive, poor oral intake, AKI and hypovolemic hypernatremia Secondary to poor oral intake and free water deficit - Advance to clear liquid diet per speech today - Previous discussion for G tube placement (per son/daughter-in-law request) on hold given patient's tenuous status - Not sure a G-tube is appropriate given patient's history and high risk for complications should she pull it out given baseline dementia - patient son agreeable to hold off on PEG tube placement for now -family's main concern was calorie intake but have never spoken to a dietitian or nutritionist -Nutrition/dietary asked to follow along to assist with calorie count given limited p.o. intake will likely need high-calorie supplementation -appreciate insight and recommendations Lab Results  Component Value Date   NA 146 (H) 10/24/2020   K 2.8 (L) 10/24/2020   CL 114 (H) 10/24/2020   CO2 23 10/24/2020   Hypokalemia Replaced -continues to be low in the setting of poor p.o. intake, questionable GI losses  Essential hypertension BP parameters are optimal.   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   Mild thrombocytopenia Unclear etiology, continue to monitor. No signs of obvious bleeding.  Pressure injury of skin Pressure Injury 10/21/20 Vertebral column Left (Active)  10/21/20 0845  Location: Vertebral column  Location Orientation: Left  Staging:   Wound Description (Comments):   Present on Admission: Yes   Left arm swollen:  Unclear etiology - possible dependent edema/infiltration of IV DVT study negative Keep arm  elevated  DVT prophylaxis: Eliquis Code Status: DNR Family Communication: Lengthy discussion with son over the phone at bedside, encouraged by mother's improvement, lengthy discussion about discontinuation of PEG tube placement and transition to high calorie intake.  Discussed high risk status, increased morbidity mortality as well as possible lengthy hospital stay given her profound illness and possible disposition to facility pending her needs.  Disposition:  Status is: Inpatient  Remains inpatient appropriate because:Ongoing diagnostic testing needed not appropriate for outpatient work up, Unsafe d/c plan and IV treatments appropriate due to intensity of illness or inability to take PO   Dispo: The patient is from: Home              Anticipated d/c is to: To be determined              Patient currently is not medically stable to d/c.   Difficult to place patient No   Consultants:   IR  Palliative care   Procedures: none.   Antimicrobials:  Antibiotics Given (last 72 hours)    Date/Time Action Medication Dose Rate   10/21/20 1002 New Bag/Given   ceFEPIme (MAXIPIME) 2 g in sodium chloride 0.9 % 100 mL IVPB 2 g 200 mL/hr   10/21/20 2138 New Bag/Given   ceFEPIme (MAXIPIME) 2 g in sodium chloride 0.9 % 100 mL IVPB 2 g 200 mL/hr   10/21/20 2242 New Bag/Given   vancomycin (VANCOREADY) IVPB 750 mg/150 mL 750 mg 150 mL/hr   10/22/20 1031 New Bag/Given   ceFEPIme (MAXIPIME) 2 g in sodium chloride 0.9 % 100 mL IVPB 2 g 200 mL/hr   10/22/20 2257 New Bag/Given  ceFEPIme (MAXIPIME) 2 g in sodium chloride 0.9 % 100 mL IVPB 2 g 200 mL/hr   10/22/20 2301 New Bag/Given   vancomycin (VANCOREADY) IVPB 750 mg/150 mL 750 mg 150 mL/hr   10/23/20 1040 New Bag/Given   ceFEPIme (MAXIPIME) 2 g in sodium chloride 0.9 % 100 mL IVPB 2 g 200 mL/hr   10/23/20 2124 New Bag/Given   ceFEPIme (MAXIPIME) 2 g in sodium chloride 0.9 % 100 mL IVPB 2 g 200 mL/hr   10/23/20 2216 New Bag/Given   vancomycin  (VANCOREADY) IVPB 750 mg/150 mL 750 mg 150 mL/hr        Subjective: No acute issues or events overnight, patient is awake alert this morning oriented to self and general situation only which appears to be close to her baseline, only complaint from the patient is increased thirst, we encouraged her to drink more and take p.o. today as tolerated but otherwise she denies any shortness of breath chest pain nausea vomiting diarrhea or constipation.  Objective: Vitals:   10/24/20 0300 10/24/20 0400 10/24/20 0500 10/24/20 0600  BP: (!) 127/46 (!) 149/46 (!) 119/45 (!) 140/56  Pulse: 60 (!) 48 (!) 53 (!) 51  Resp: (!) 9 13 12 15   Temp:  (!) 97.5 F (36.4 C)    TempSrc:  Oral    SpO2: 99% 99% 98% 100%  Weight:      Height:        Intake/Output Summary (Last 24 hours) at 10/24/2020 0741 Last data filed at 10/24/2020 10/26/2020 Gross per 24 hour  Intake 2837.64 ml  Output 1850 ml  Net 987.64 ml   Filed Weights   10/19/20 2107  Weight: 61.3 kg   Examination:  General exam: Alert and comfortable, no acute distress, awake alert to person general situation Respiratory system: Diminished respiratory effort without overt wheezes rhonchi or rales. Cardiovascular system: S1-S2 heard, regular rate rhythm, no JVD Gastrointestinal system: Abdomen is soft nontender bowel sounds normal Central nervous system: Alert and able to answer simple questions Extremities: Left upper extremity edema present 2+  Skin: Multiple decubitus ulcers on the sacrum and left buttock  Data Reviewed: I have personally reviewed following labs and imaging studies  CBC: Recent Labs  Lab 10/19/20 1611 10/19/20 1711 10/21/20 0553 10/23/20 0912 10/24/20 0302  WBC 11.2*  --  10.9* 8.6 7.5  NEUTROABS 8.4*  --   --   --   --   HGB 11.7* 12.2 10.6* 12.3 9.5*  HCT 40.2 36.0 36.0 41.5 30.7*  MCV 107.2*  --  105.6* 104.8* 100.7*  PLT 121*  --  126* 116* 102*   Basic Metabolic Panel: Recent Labs  Lab 10/20/20 0950  10/20/20 2030 10/21/20 0553 10/22/20 0326 10/23/20 0912 10/24/20 0302  NA 150* 147* 146* 147* 147* 146*  K 3.0* 4.0 3.8 3.7 2.6* 2.8*  CL 117* 112* 111 112* 114* 114*  CO2 26 26 24 25  21* 23  GLUCOSE 184* 95 97 88 68* 69*  BUN 13 12 12 12 10 10   CREATININE 0.47 0.48 0.46 0.48 0.40* 0.38*  CALCIUM 8.8* 8.2* 8.5* 8.8* 8.6* 8.6*  MG 2.2  --  2.2 2.2  --   --   PHOS 2.0*  --  4.1  --   --   --    GFR: Estimated Creatinine Clearance: 48.3 mL/min (A) (by C-G formula based on SCr of 0.38 mg/dL (L)).  Liver Function Tests: Recent Labs  Lab 10/23/20 0912 10/24/20 0302  AST 20 20  ALT 15 15  ALKPHOS 133* 123  BILITOT 1.1 1.1  PROT 5.8* 5.4*  ALBUMIN 2.1* 1.9*    CBG: Recent Labs  Lab 10/21/20 1608 10/23/20 2349 10/23/20 2350 10/24/20 0105 10/24/20 0106  GLUCAP 118* 52* 63* 88 84     Recent Results (from the past 240 hour(s))  Blood culture (routine x 2)     Status: None (Preliminary result)   Collection Time: 10/19/20  5:02 PM   Specimen: BLOOD LEFT FOREARM  Result Value Ref Range Status   Specimen Description   Final    BLOOD LEFT FOREARM Performed at [facility], [address] [place], [place] [address]    Special Requests   Final    BOTTLES DRAWN AEROBIC AND ANAEROBIC Blood Culture results may not be optimal due to an excessive volume of blood received in culture bottles Performed at [facility], [address] [place], [place] [address]    Culture   Final    NO GROWTH 4 DAYS Performed at [facility], [address], [place], [place] [address]    Report Status PENDING  Incomplete  SARS CORONAVIRUS 2 (TAT 6-24 HRS) Nasopharyngeal Nasopharyngeal Swab     Status: None   Collection Time: 10/19/20  5:14 PM   Specimen: Nasopharyngeal Swab  Result Value Ref Range Status   SARS Coronavirus 2 NEGATIVE NEGATIVE Final    Comment: (NOTE) SARS-CoV-2 target nucleic acids are NOT DETECTED.  The SARS-CoV-2 RNA is  generally detectable in upper and lower respiratory specimens during the acute phase of infection. Negative results do not preclude SARS-CoV-2 infection, do not rule out co-infections with other pathogens, and should not be used as the sole basis for treatment or other patient management decisions. Negative results must be combined with clinical observations, patient history, and epidemiological information. The expected result is Negative.  Fact Sheet for Patients: HairSlick.no  Fact Sheet for Healthcare Providers: quierodirigir.com  This test is not yet approved or cleared by the [place] FDA and  has been authorized for detection and/or diagnosis of SARS-CoV-2 by FDA under an Emergency Use Authorization (EUA). This EUA will remain  in effect (meaning this test can be used) for the duration of the COVID-19 declaration under Se ction 564(b)(1) of the Act, 21 U.S.C. section 360bbb-3(b)(1), unless the authorization is terminated or revoked sooner.  Performed at [facility], [address] [place], [place] [address]   Blood culture (routine x 2)     Status: Abnormal   Collection Time: 10/19/20  9:42 PM   Specimen: BLOOD  Result Value Ref Range Status   Specimen Description   Final    BLOOD BLOOD RIGHT HAND Performed at [facility], [address] [place], [place] [address]    Special Requests   Final    BOTTLES DRAWN AEROBIC ONLY Blood Culture adequate volume Performed at [facility], [address] [place], [place] [address]    Culture  Setup Time   Final    GRAM POSITIVE COCCI IN CLUSTERS AEROBIC BOTTLE ONLY CRITICAL RESULT CALLED TO, READ BACK BY AND VERIFIED WITH: PHARMD ANH PHAM BY MESSAN H. AT 1093 ON 10/21/2020    Culture (A)  Final    STAPHYLOCOCCUS AURICULARIS THE SIGNIFICANCE OF ISOLATING THIS ORGANISM FROM A SINGLE SET OF BLOOD CULTURES WHEN MULTIPLE SETS  ARE DRAWN IS UNCERTAIN. PLEASE NOTIFY THE MICROBIOLOGY DEPARTMENT WITHIN ONE WEEK IF SPECIATION AND SENSITIVITIES ARE REQUIRED. Performed at  [facility], [address] [place], [place] [address]    Report Status 10/23/2020 FINAL  Final  Blood Culture ID Panel (Reflexed)     Status: Abnormal   Collection Time: 10/19/20  9:42 PM  Result Value Ref Range Status   Enterococcus faecalis NOT DETECTED NOT DETECTED Final   Enterococcus Faecium NOT DETECTED NOT DETECTED Final   Listeria monocytogenes NOT DETECTED NOT DETECTED Final   Staphylococcus species DETECTED (A) NOT DETECTED Final    Comment: CRITICAL RESULT CALLED TO, READ BACK BY AND VERIFIED WITH: PHARMD ANH PHAM BY MESSAN H. AT [id number] ON 10/21/2020    Staphylococcus aureus (BCID) NOT DETECTED NOT DETECTED Final   Staphylococcus epidermidis NOT DETECTED NOT DETECTED Final   Staphylococcus lugdunensis NOT DETECTED NOT DETECTED Final   Streptococcus species NOT DETECTED NOT DETECTED Final   Streptococcus agalactiae NOT DETECTED NOT DETECTED Final   Streptococcus pneumoniae NOT DETECTED NOT DETECTED Final   Streptococcus pyogenes NOT DETECTED NOT DETECTED Final   A.calcoaceticus-baumannii NOT DETECTED NOT DETECTED Final   Bacteroides fragilis NOT DETECTED NOT DETECTED Final   Enterobacterales NOT DETECTED NOT DETECTED Final   Enterobacter cloacae complex NOT DETECTED NOT DETECTED Final   Escherichia coli NOT DETECTED NOT DETECTED Final   Klebsiella aerogenes NOT DETECTED NOT DETECTED Final   Klebsiella oxytoca NOT DETECTED NOT DETECTED Final   Klebsiella pneumoniae NOT DETECTED NOT DETECTED Final   Proteus species NOT DETECTED NOT DETECTED Final   Salmonella species NOT DETECTED NOT DETECTED Final   Serratia marcescens NOT DETECTED NOT DETECTED Final   Haemophilus influenzae NOT DETECTED NOT DETECTED Final   Neisseria meningitidis NOT DETECTED NOT DETECTED Final   Pseudomonas aeruginosa NOT DETECTED NOT DETECTED Final    Stenotrophomonas maltophilia NOT DETECTED NOT DETECTED Final   Candida albicans NOT DETECTED NOT DETECTED Final   Candida auris NOT DETECTED NOT DETECTED Final   Candida glabrata NOT DETECTED NOT DETECTED Final   Candida krusei NOT DETECTED NOT DETECTED Final   Candida parapsilosis NOT DETECTED NOT DETECTED Final   Candida tropicalis NOT DETECTED NOT DETECTED Final   Cryptococcus neoformans/gattii NOT DETECTED NOT DETECTED Final    Comment: Performed at [facility], [address] [place], [place] [address]  MRSA PCR Screening     Status: Abnormal   Collection Time: 10/21/20  4:15 PM   Specimen: Nasopharyngeal  Result Value Ref Range Status   MRSA by PCR POSITIVE (A) NEGATIVE Final    Comment:        The GeneXpert MRSA Assay (FDA approved for NASAL specimens only), is one component of a comprehensive MRSA colonization surveillance program. It is not intended to diagnose MRSA infection nor to guide or monitor treatment for MRSA infections. RESULT CALLED TO, READ BACK BY AND VERIFIED WITH: MIKAYLA HAILEY RN 10/21/20 @1902  BY P.HENDERSON Performed at [facility], [address] [place], [place] [address]          Radiology Studies: No results found. Scheduled Meds: . vitamin C  1,000 mg Oral Daily  . busPIRone  15 mg Oral BID  . Chlorhexidine Gluconate Cloth  6 each Topical Daily  . collagenase   Topical Daily  . donepezil  10 mg Oral QHS  . dronabinol  2.5 mg Oral QHS  . ferrous sulfate  325 mg Oral Q breakfast  . linaclotide  145 mcg Oral Daily  . mouth rinse  15 mL Mouth Rinse BID  . memantine  28 mg Oral  QHS  . mupirocin ointment  1 application Nasal BID  . potassium chloride  60 mEq Oral BID   Continuous Infusions: . ceFEPime (MAXIPIME) IV Stopped (10/23/20 2156)  . lactated ringers 75 mL/hr at 10/24/20 0647  . potassium chloride 10 mEq (10/24/20 0648)  . vancomycin Stopped (10/23/20 2316)     LOS: 5 days   Azucena Fallen,  DO [facility] Pager: Secure chat  If between 7 PM and 7 AM please contact overnight coverage  10/24/2020, 7:41 AM

## 2020-10-24 NOTE — Progress Notes (Signed)
Initial Nutrition Assessment  DOCUMENTATION CODES:   Not applicable  INTERVENTION:  - will order Hormel Shake BID with meals, each supplement provides 500 kcal and 22 grams protein. - will order Boost Breeze BID, each supplement provides 250 kcal and 9 grams of protein. - will order 30 ml Prosource Plus BID, each supplement provides 100 kcal and 15 grams protein.  - will order Magic Cup BID with meals, each supplement provides 290 kcal and 9 grams of protein. - will order 1 tablet multivitamin with minerals/day.    NUTRITION DIAGNOSIS:   Increased nutrient needs related to acute illness,wound healing as evidenced by estimated needs.  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 She presented to the ED from home due to FTT with minimal PO intake recently. In the ED she was severely hypernatremic. She was a Rapid Response on 3/12 due to likely aspiration event. She was made DNR on 10/22/20. Family had requested PEG placement but due to patient's mental status, baseline, and long-term prognosis it is unclear that a PEG tube would be appropriate.  Diet downgraded from Heart Healthy to FLD on 3/11 at 1155 and then to NPO on 3/12 at 1245. SLP saw patient yesterday AM and diet was re-advanced to FLD. No intakes have been documented since admission.  She is noted to be a/o to self and place with hx of dementia. Patient laying in bed with no family or visitors present.   She denies abdominal pain/pressure or nausea. She thinks she had breakfast but is unable to remember the details. Unable to obtain any other  nutrition-related information from patient at this time.  Able to talk with RN who reports patient did not have breakfast. Talked with RN about plans to order oral nutrition supplements. RN feels patient will do well with protein shakes and states she has been sipping on ginger ale throughout the day.   Weight on 3/10 was 135 lb and weight on 08/07/20 was documented as 160 lb. This indicates 25 lb weight loss (15.6% body weight) in the past 2.5 months; significant for time frame.  Patient is at very high risk for malnutrition. Do no feel comfortable identifying malnutrition at this time with limited information able to be gathered from patient and unable to talk with family at this time.   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 Medications reviewed; 2.5 mg marinol/day, 20 mEq Klor-Con x4 doses 3/15, 10 mEq IV KCl x5 runs 3/15. IVF; LR @ 75 ml/hr.    NUTRITION - FOCUSED PHYSICAL EXAM:  Flowsheet Row Most Recent Value  Orbital Region No depletion  Upper Arm Region No depletion  Thoracic and Lumbar Region Unable to assess  Buccal Region Mild depletion  Temple Region Mild depletion  Clavicle Bone Region Moderate depletion  Clavicle and Acromion Bone Region Moderate depletion  Scapular  Bone Region Unable to assess  Dorsal Hand Mild depletion  Patellar Region No depletion  Anterior Thigh Region No depletion  Posterior Calf Region Unable to assess  [bilateral boots]  Edema (RD Assessment) Unable to assess  Hair Reviewed  Eyes Reviewed  Mouth Unable to assess  Skin Reviewed  Nails Reviewed       Diet Order:   Diet Order             Diet full liquid Room service appropriate? Yes; Fluid consistency: Thin  Diet effective now                 EDUCATION NEEDS:   Not appropriate for education at this time  Skin:  Skin Assessment: Skin Integrity Issues: Skin Integrity Issues:: Stage III,Stage IV,Unstageable Stage III: mid buttocks Stage IV: L buttocks Unstageable: full thickness to vertebral column  Last BM:  3/15 (type 4 x1)  Height:   Ht Readings from Last 1 Encounters:  10/19/20 5\' 2"  (1.575 m)    Weight:   Wt Readings from Last 1 Encounters:  10/19/20 61.3 kg    Estimated Nutritional Needs:  Kcal:  1850-2050 kcal Protein:  95-110 grams Fluid:  >/= 2.2 L/day     12/19/20, MS, RD, LDN, CNSC Inpatient Clinical Dietitian RD pager # available in [facility]  After hours/weekend pager # available in [facility]

## 2020-10-25 DIAGNOSIS — M4624 Osteomyelitis of vertebra, thoracic region: Secondary | ICD-10-CM | POA: Diagnosis not present

## 2020-10-25 DIAGNOSIS — R627 Adult failure to thrive: Secondary | ICD-10-CM

## 2020-10-25 DIAGNOSIS — I5181 Takotsubo syndrome: Secondary | ICD-10-CM | POA: Diagnosis not present

## 2020-10-25 DIAGNOSIS — J9601 Acute respiratory failure with hypoxia: Secondary | ICD-10-CM

## 2020-10-25 DIAGNOSIS — E870 Hyperosmolality and hypernatremia: Secondary | ICD-10-CM | POA: Diagnosis not present

## 2020-10-25 DIAGNOSIS — F0390 Unspecified dementia without behavioral disturbance: Secondary | ICD-10-CM

## 2020-10-25 DIAGNOSIS — L8995 Pressure ulcer of unspecified site, unstageable: Secondary | ICD-10-CM | POA: Diagnosis not present

## 2020-10-25 DIAGNOSIS — L8994 Pressure ulcer of unspecified site, stage 4: Secondary | ICD-10-CM | POA: Diagnosis not present

## 2020-10-25 DIAGNOSIS — L089 Local infection of the skin and subcutaneous tissue, unspecified: Secondary | ICD-10-CM

## 2020-10-25 DIAGNOSIS — R6251 Failure to thrive (child): Secondary | ICD-10-CM

## 2020-10-25 DIAGNOSIS — R001 Bradycardia, unspecified: Secondary | ICD-10-CM

## 2020-10-25 DIAGNOSIS — E876 Hypokalemia: Secondary | ICD-10-CM | POA: Diagnosis not present

## 2020-10-25 LAB — COMPREHENSIVE METABOLIC PANEL
ALT: 15 U/L (ref 0–44)
AST: 17 U/L (ref 15–41)
Albumin: 1.8 g/dL — ABNORMAL LOW (ref 3.5–5.0)
Alkaline Phosphatase: 109 U/L (ref 38–126)
Anion gap: 10 (ref 5–15)
BUN: 11 mg/dL (ref 8–23)
CO2: 19 mmol/L — ABNORMAL LOW (ref 22–32)
Calcium: 8.8 mg/dL — ABNORMAL LOW (ref 8.9–10.3)
Chloride: 117 mmol/L — ABNORMAL HIGH (ref 98–111)
Creatinine, Ser: 0.41 mg/dL — ABNORMAL LOW (ref 0.44–1.00)
GFR, Estimated: >60 mL/min (ref >60)
Glucose, Bld: 73 mg/dL (ref 70–99)
Potassium: 2.9 mmol/L — ABNORMAL LOW (ref 3.5–5.1)
Sodium: 146 mmol/L — ABNORMAL HIGH (ref 135–145)
Total Bilirubin: 0.7 mg/dL (ref 0.3–1.2)
Total Protein: 5 g/dL — ABNORMAL LOW (ref 6.5–8.1)

## 2020-10-25 LAB — RENAL FUNCTION PANEL
Albumin: 1.8 g/dL — ABNORMAL LOW (ref 3.5–5.0)
Anion gap: 8 (ref 5–15)
BUN: 10 mg/dL (ref 8–23)
CO2: 21 mmol/L — ABNORMAL LOW (ref 22–32)
Calcium: 8.6 mg/dL — ABNORMAL LOW (ref 8.9–10.3)
Chloride: 113 mmol/L — ABNORMAL HIGH (ref 98–111)
Creatinine, Ser: 0.49 mg/dL (ref 0.44–1.00)
GFR, Estimated: >60 mL/min (ref >60)
Glucose, Bld: 137 mg/dL — ABNORMAL HIGH (ref 70–99)
Phosphorus: 1.5 mg/dL — ABNORMAL LOW (ref 2.5–4.6)
Potassium: 2.7 mmol/L — CL (ref 3.5–5.1)
Sodium: 142 mmol/L (ref 135–145)

## 2020-10-25 LAB — CBC
HCT: 27.1 % — ABNORMAL LOW (ref 36.0–46.0)
HCT: 28.7 % — ABNORMAL LOW (ref 36.0–46.0)
Hemoglobin: 8.2 g/dL — ABNORMAL LOW (ref 12.0–15.0)
Hemoglobin: 8.7 g/dL — ABNORMAL LOW (ref 12.0–15.0)
MCH: 31.2 pg (ref 26.0–34.0)
MCH: 31.1 pg (ref 26.0–34.0)
MCHC: 30.3 g/dL (ref 30.0–36.0)
MCHC: 30.3 g/dL (ref 30.0–36.0)
MCV: 103 fL — ABNORMAL HIGH (ref 80.0–100.0)
MCV: 102.5 fL — ABNORMAL HIGH (ref 80.0–100.0)
Platelets: 140 10*3/uL — ABNORMAL LOW (ref 150–400)
Platelets: 150 10*3/uL (ref 150–400)
RBC: 2.63 MIL/uL — ABNORMAL LOW (ref 3.87–5.11)
RBC: 2.8 MIL/uL — ABNORMAL LOW (ref 3.87–5.11)
RDW: 17.9 % — ABNORMAL HIGH (ref 11.5–15.5)
RDW: 17.7 % — ABNORMAL HIGH (ref 11.5–15.5)
WBC: 7.9 10*3/uL (ref 4.0–10.5)
WBC: 7.9 10*3/uL (ref 4.0–10.5)
nRBC: 0 % (ref 0.0–0.2)
nRBC: 0 % (ref 0.0–0.2)

## 2020-10-25 LAB — C-REACTIVE PROTEIN: CRP: 1.4 mg/dL — ABNORMAL HIGH (ref <1.0)

## 2020-10-25 LAB — SEDIMENTATION RATE: Sed Rate: 29 mm/hr — ABNORMAL HIGH (ref 0–22)

## 2020-10-25 LAB — MAGNESIUM: Magnesium: 1.6 mg/dL — ABNORMAL LOW (ref 1.7–2.4)

## 2020-10-25 MED ORDER — POTASSIUM CHLORIDE CRYS ER 20 MEQ PO TBCR
40.0000 meq | EXTENDED_RELEASE_TABLET | ORAL | Status: AC
  Administered 2020-10-25 (×2): 40 meq via ORAL
  Filled 2020-10-25 (×2): qty 2

## 2020-10-25 MED ORDER — SODIUM CHLORIDE 0.9 % IV SOLN
INTRAVENOUS | Status: DC | PRN
  Administered 2020-10-25: 250 mL via INTRAVENOUS

## 2020-10-25 MED ORDER — POTASSIUM CL IN DEXTROSE 5% 20 MEQ/L IV SOLN
20.0000 meq | INTRAVENOUS | Status: DC
  Administered 2020-10-25: 20 meq via INTRAVENOUS
  Filled 2020-10-25: qty 1000

## 2020-10-25 MED ORDER — POTASSIUM PHOSPHATES 15 MMOLE/5ML IV SOLN
30.0000 mmol | Freq: Once | INTRAVENOUS | Status: AC
  Administered 2020-10-25: 30 mmol via INTRAVENOUS
  Filled 2020-10-25: qty 10

## 2020-10-25 MED ORDER — KCL IN DEXTROSE-NACL 20-5-0.2 MEQ/L-%-% IV SOLN
INTRAVENOUS | Status: DC
  Filled 2020-10-25 (×2): qty 1000

## 2020-10-25 MED ORDER — POTASSIUM CHLORIDE CRYS ER 20 MEQ PO TBCR
40.0000 meq | EXTENDED_RELEASE_TABLET | ORAL | Status: AC
Start: 2020-10-25 — End: 2020-10-25
  Administered 2020-10-25 (×2): 40 meq via ORAL
  Filled 2020-10-25 (×2): qty 2

## 2020-10-25 MED ORDER — MAGNESIUM SULFATE 2 GM/50ML IV SOLN
2.0000 g | Freq: Once | INTRAVENOUS | Status: AC
  Administered 2020-10-25: 2 g via INTRAVENOUS
  Filled 2020-10-25: qty 50

## 2020-10-25 NOTE — Progress Notes (Signed)
Date and time results received: 10/25/20 @ 1800   Test: K+ Critical Value: 2.7  Name of Provider Notified: Dr.Gonfa  Orders Received? Or Actions Taken?: Awaiting Orders, Continuing to monitor

## 2020-10-25 NOTE — Progress Notes (Signed)
PROGRESS NOTE  [name] ACZ:[id number] DOB: 1940-09-25   PCP: Nolene Ebbs, MD  Patient is from: Home.  Bedbound and dependent for all ADLs at baseline.  DOA: 10/19/2020 LOS: 6  Chief complaints: Wound infection  Brief Narrative / Interim history: 80 year old F with PMH of dementia, recent vertebral osteomyelitis (completed antibiotics on 09/08/2020), multiple decubitus ulcer, DVT/PE on Eliquis, OSA, HTN and HLD brought to ED due to failure to thrive and admitted for hypernatremia, infected decubitus ulcer and failure to thrive.  Reportedly failed outpatient treatment with p.o. antibiotics.  She was treated with IV antibiotics and hypotonic IV fluid.   Hospital course complicated by acute hypoxemic respiratory failure thought to be due to aspiration in the setting of acute metabolic encephalopathy requiring transfer to ICU/stepdown for close monitoring.  Eventually weaned off oxygen.  SLP recommended full liquid diet.  CODE STATUS changed to DNR/DNI per discussion between previous attending and family.  PEG tube was entertained but not pursued.   She is currently on IV cefepime for aspiration pneumonia. Also with hyponatremia and hypokalemia  Subjective: Seen and examined earlier this morning.  No major events overnight or this morning.  Soft blood pressures with diastolic in upper 10X.  Also bradycardia to 40s per RN.  Patient has no complaints.  She denies chest pain, dyspnea or GI symptoms.  Objective: Vitals:   10/25/20 0900 10/25/20 1000 10/25/20 1100 10/25/20 1200  BP: (!) 139/57 (!) 158/53 (!) 142/50   Pulse: (!) 43 (!) 44 (!) 50   Resp: 12 11 (!) 9   Temp:    97.8 F (36.6 C)  TempSrc:    Oral  SpO2: 98% 97% 99%   Weight:      Height:        Intake/Output Summary (Last 24 hours) at 10/25/2020 1421 Last data filed at 10/25/2020 0800 Gross per 24 hour  Intake 1500.03 ml  Output 400 ml  Net 1100.03 ml   Filed Weights   10/19/20 2107  Weight: 61.3 kg     Examination:  GENERAL: No apparent distress.  Nontoxic. HEENT: MMM.  Vision and hearing grossly intact.  NECK: Supple.  No apparent JVD.  RESP: 99% on RA.  No IWOB.  Fair aeration but limited exam due to body habitus CVS: Bradycardic to 50s.  Regular rhythm. Heart sounds normal.  ABD/GI/GU: BS+. Abd soft, NTND.  MSK/EXT:  Moves extremities but generalized weakness in all extremities. SKIN: Pressure skin injuries over her thoracolumbar, sacral and left buttock.  See pictures for more. NEURO: Awake and alert.  Oriented to self, place and person.  No apparent focal neuro deficit. PSYCH: Calm. Normal affect.      Procedures:  None  Microbiology summarized: 3/10-COVID-19 PCR nonreactive 3/10-blood culture with Staphylococcus auricularis in aerobic bottles from single set 3/15-repeat blood cultures NGTD  Assessment & Plan: Infected multiple decubitus ulcer, POA -CT of the abdomen pelvis does not show any acute osteomyelitis at this time. 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 -Initial blood culture with staph auricularis in aerobic bottle likely contaminant.   -Repeat blood cultures NGTD. -On IV vancomycin and cefepime 3/10>>> -Check CRP and ESR -Will discuss with ID about antibiotic course  Acute hypoxic respiratory failure 2/2 aspiration event, resolving -CTA chest on 3/12 with complete collapse of RLL with large amount of debris's in LLL bronchus  intermedius -  On cefepime and vancomycin but has no anaerobic coverage although clinical improving -Appreciate input by SLP-full liquid diet -Continue aspiration precautions  Acute metabolic encephalopathy: Seems to have resolved. Chronic dementia without behavioral disturbance -Oriented to self, place and person -Discontinue Aricept in the  setting of bradycardia -Continue Namenda -Reorientation and delirium precautions  Sinus bradycardia: HR in 40s and 50s.  Is on Aricept which might be contributing -Hold Aricept -Replenish electrolytes  Hypovolemic hypernatremia: NA 146. -Change LR to D5 -Continue monitoring -Encourage p.o. intake  Hypokalemia/hypomagnesemia: K2.9.  Refeeding syndrome? -K-Dur 40 mEq x 2 -IV magnesium sulfate 2 g x 1 -Continue monitoring  Essential hypertension: Soft DBP.  Not on antihypertensive meds. 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 Hgb dropped about 2g.  -Hold Eliquis given drop in Hgb -Repeat CBC this afternoon  Normocytic anemia: no obvious bleeding.  Recent Labs    08/01/20 1245 08/02/20 0200 08/03/20 0309 08/04/20 0500 10/19/20 1611 10/19/20 1711 10/21/20 0553 10/23/20 0912 10/24/20 0302 10/25/20 0458  HGB 8.9* 8.3* 8.2* 8.6* 11.7* 12.2 10.6* 12.3 9.5* 8.2*  -Hold Hgb -Repeat CBG this afternoon  Thrombocytopenia: Improved Recent Labs  Lab 10/19/20 1611 10/21/20 0553 10/23/20 0912 10/24/20 0302 10/25/20 0458  PLT 121* 126* 116* 102* 140*   Recurrent hypoglycemia: Likely due to poor p.o. intake.  Not on insulin. -Now on D5 mainly for hypernatremia -Encourage p.o. intake  -Appreciate help by dietitian.  Goal of care: DNR/DNI per discussion between previous provider and patient's family.  Feeding tube on hold.  Patient son will be here tomorrow for further discussion about treatment plan, feeding tube and goal of care  Failure to thrive in the setting of poor oral intake Body mass index is 24.72 kg/m. Nutrition Problem: Increased nutrient needs Etiology: acute illness,wound healing Signs/Symptoms: estimated needs Interventions: Hormel Shake,Boost Breeze,Prostat,Magic cup,MVI   Pressure skin injury: POA Pressure Injury 10/19/20 Vertebral column Left;Mid Unstageable - Full thickness tissue loss in which the  base of the injury is covered by slough (yellow, tan, gray, green or brown) and/or eschar (tan, brown or black) in the wound bed. eschar d/t removal  (Active)  10/19/20 2020  Location: Vertebral column  Location Orientation: Left;Mid  Staging: Unstageable - Full thickness tissue loss in which the base of the injury is covered by slough (yellow, tan, gray, green or brown) and/or eschar (tan, brown or black) in the wound bed.  Wound Description (Comments): eschar d/t removal of medical equipment which became infected (6x5)  Present on Admission: Yes     Pressure Injury 10/19/20 Buttocks Left Stage 4 - Full thickness tissue loss with exposed bone, tendon or muscle. 7x2x3, tunneling at 12'oclock-6cm, 9'oclock-1.5cm. (Active)  10/19/20 2020  Location: Buttocks  Location Orientation: Left  Staging: Stage 4 - Full thickness tissue loss with exposed bone, tendon or muscle.  Wound Description (Comments): 7x2x3, tunneling at 12'oclock-6cm, 9'oclock-1.5cm.  Present on Admission: Yes     Pressure Injury 10/19/20 Buttocks Mid Stage 3 -  Full thickness tissue loss. Subcutaneous fat may be visible but bone, tendon or muscle are NOT exposed. intragluteal cluster, 8.5x5 (Active)  10/19/20 2020  Location: Buttocks  Location Orientation: Mid  Staging: Stage 3 -  Full thickness tissue loss. Subcutaneous fat may be visible but bone, tendon or muscle are NOT exposed.  Wound Description (Comments): intragluteal cluster, 8.5x5  Present on Admission: Yes   DVT prophylaxis:  SCD for now  Code Status: DNR/DNI Family Communication: Updated patient's son  over the phone. Level of care: Telemetry Status is: Inpatient  Remains inpatient appropriate because:Persistent severe electrolyte disturbances, IV treatments appropriate due to intensity of illness or inability to take PO and Inpatient level of care appropriate due to severity of illness   Dispo: The patient is from: Home              Anticipated d/c is to:  To be determined              Patient currently is not medically stable to d/c.   Difficult to place patient No       Consultants:  Palliative medicine Infectious disease   Sch Meds:  Scheduled Meds: . (feeding supplement) PROSource Plus  30 mL Oral BID BM  . vitamin C  1,000 mg Oral Daily  . busPIRone  15 mg Oral BID  . Chlorhexidine Gluconate Cloth  6 each Topical Daily  . collagenase   Topical Daily  . dronabinol  2.5 mg Oral QHS  . feeding supplement  1 Container Oral BID BM  . ferrous sulfate  325 mg Oral Q breakfast  . linaclotide  145 mcg Oral Daily  . mouth rinse  15 mL Mouth Rinse BID  . memantine  28 mg Oral QHS  . multivitamin with minerals  1 tablet Oral Daily  . mupirocin ointment  1 application Nasal BID  . sodium chloride flush  10-40 mL Intracatheter Q12H   Continuous Infusions: . sodium chloride 10 mL/hr at 10/25/20 0451  . ceFEPime (MAXIPIME) IV Stopped (10/25/20 1341)  . dextrose 5 % with KCl 20 mEq / L 75 mL/hr at 10/25/20 0800  . magnesium sulfate bolus IVPB    . vancomycin Stopped (10/25/20 0043)   PRN Meds:.sodium chloride, acetaminophen, nitroGLYCERIN, oxyCODONE, sodium chloride flush  Antimicrobials: Anti-infectives (From admission, onward)   Start     Dose/Rate Route Frequency Ordered Stop   10/21/20 2200  vancomycin (VANCOREADY) IVPB 750 mg/150 mL        750 mg 150 mL/hr over 60 Minutes Intravenous Every 24 hours 10/21/20 0739     10/20/20 2200  vancomycin (VANCOREADY) IVPB 1250 mg/250 mL  Status:  Discontinued        1,250 mg 166.7 mL/hr over 90 Minutes Intravenous Every 24 hours 10/19/20 2253 10/21/20 0739   10/19/20 2315  ceFEPIme (MAXIPIME) 2 g in sodium chloride 0.9 % 100 mL IVPB        2 g 200 mL/hr over 30 Minutes Intravenous Every 12 hours 10/19/20 2226     10/19/20 2300  vancomycin (VANCOREADY) IVPB 1000 mg/200 mL        1,000 mg 200 mL/hr over 60 Minutes Intravenous  Once 10/19/20 2209 10/20/20 0135   10/19/20 2300  ceFEPIme  (MAXIPIME) 2 g in sodium chloride 0.9 % 100 mL IVPB  Status:  Discontinued        2 g 200 mL/hr over 30 Minutes Intravenous  Once 10/19/20 2209 10/19/20 2226   10/19/20 1615  ceFAZolin (ANCEF) IVPB 1 g/50 mL premix        1 g 100 mL/hr over 30 Minutes Intravenous  Once 10/19/20 1614 10/19/20 1931       I have personally reviewed the following labs and images: CBC: Recent Labs  Lab 10/19/20 1611 10/19/20 1711 10/21/20 0553 10/23/20 0912 10/24/20 0302 10/25/20 0458  WBC 11.2*  --  10.9* 8.6 7.5 7.9  NEUTROABS 8.4*  --   --   --   --   --  HGB 11.7* 12.2 10.6* 12.3 9.5* 8.2*  HCT 40.2 36.0 36.0 41.5 30.7* 27.1*  MCV 107.2*  --  105.6* 104.8* 100.7* 103.0*  PLT 121*  --  126* 116* 102* 140*   BMP &GFR Recent Labs  Lab 10/20/20 0950 10/20/20 2030 10/21/20 0553 10/22/20 0326 10/23/20 0912 10/24/20 0302 10/25/20 0458  NA 150*   < > 146* 147* 147* 146* 146*  K 3.0*   < > 3.8 3.7 2.6* 2.8* 2.9*  CL 117*   < > 111 112* 114* 114* 117*  CO2 26   < > 24 25 21* 23 19*  GLUCOSE 184*   < > 97 88 68* 69* 73  BUN 13   < > '12 12 10 10 11  ' CREATININE 0.47   < > 0.46 0.48 0.40* 0.38* 0.41*  CALCIUM 8.8*   < > 8.5* 8.8* 8.6* 8.6* 8.8*  MG 2.2  --  2.2 2.2  --   --  1.6*  PHOS 2.0*  --  4.1  --   --   --   --    < > = values in this interval not displayed.   Estimated Creatinine Clearance: 48.3 mL/min (A) (by C-G formula based on SCr of 0.41 mg/dL (L)). Liver & Pancreas: Recent Labs  Lab 10/23/20 0912 10/24/20 0302 10/25/20 0458  AST '20 20 17  ' ALT '15 15 15  ' ALKPHOS 133* 123 109  BILITOT 1.1 1.1 0.7  PROT 5.8* 5.4* 5.0*  ALBUMIN 2.1* 1.9* 1.8*   No results for input(s): LIPASE, AMYLASE in the last 168 hours. No results for input(s): AMMONIA in the last 168 hours. Diabetic: No results for input(s): HGBA1C in the last 72 hours. Recent Labs  Lab 10/21/20 1608 10/23/20 2349 10/23/20 2350 10/24/20 0105 10/24/20 0106  GLUCAP 118* 52* 63* 88 84   Cardiac Enzymes: No  results for input(s): CKTOTAL, CKMB, CKMBINDEX, TROPONINI in the last 168 hours. No results for input(s): PROBNP in the last 8760 hours. Coagulation Profile: No results for input(s): INR, PROTIME in the last 168 hours. Thyroid Function Tests: No results for input(s): TSH, T4TOTAL, FREET4, T3FREE, THYROIDAB in the last 72 hours. Lipid Profile: No results for input(s): CHOL, HDL, LDLCALC, TRIG, CHOLHDL, LDLDIRECT in the last 72 hours. Anemia Panel: No results for input(s): VITAMINB12, FOLATE, FERRITIN, TIBC, IRON, RETICCTPCT in the last 72 hours. Urine analysis:    Component Value Date/Time   COLORURINE STRAW (A) 10/19/2020 1942   APPEARANCEUR CLEAR 10/19/2020 1942   LABSPEC 1.021 10/19/2020 1942   PHURINE 9.0 (H) 10/19/2020 1942   GLUCOSEU NEGATIVE 10/19/2020 1942   HGBUR NEGATIVE 10/19/2020 1942   BILIRUBINUR NEGATIVE 10/19/2020 1942   KETONESUR NEGATIVE 10/19/2020 1942   PROTEINUR NEGATIVE 10/19/2020 1942   UROBILINOGEN 1.0 02/14/2012 1054   NITRITE NEGATIVE 10/19/2020 1942   LEUKOCYTESUR NEGATIVE 10/19/2020 1942   Sepsis Labs: Invalid input(s): PROCALCITONIN, [place]  Microbiology: Recent Results (from the past 240 hour(s))  Blood culture (routine x 2)     Status: None   Collection Time: 10/19/20  5:02 PM   Specimen: BLOOD LEFT FOREARM  Result Value Ref Range Status   Specimen Description   Final    BLOOD LEFT FOREARM Performed at [facility] [address] [place], [place] [address]    Special Requests   Final    BOTTLES DRAWN AEROBIC AND ANAEROBIC Blood Culture results may not be optimal due to an excessive volume of blood received in culture bottles Performed at [facility]  [place] [address], [place], [place] [address]    Culture   Final    NO GROWTH 5 DAYS Performed at [facility], [place] [address], [place], [place] [address]    Report Status 10/24/2020 FINAL  Final  SARS CORONAVIRUS 2 (TAT 6-24 HRS) Nasopharyngeal  Nasopharyngeal Swab     Status: None   Collection Time: 10/19/20  5:14 PM   Specimen: Nasopharyngeal Swab  Result Value Ref Range Status   SARS Coronavirus 2 NEGATIVE NEGATIVE Final    Comment: (NOTE) SARS-CoV-2 target nucleic acids are NOT DETECTED.  The SARS-CoV-2 RNA is generally detectable in upper and lower respiratory specimens during the acute phase of infection. Negative results do not preclude SARS-CoV-2 infection, do not rule out co-infections with other pathogens, and should not be used as the sole basis for treatment or other patient management decisions. Negative results must be combined with clinical observations, patient history, and epidemiological information. The expected result is Negative.  Fact Sheet for Patients: SugarRoll.be  Fact Sheet for Healthcare Providers: https://www.woods-mathews.com/  This test is not yet approved or cleared by the [place] FDA and  has been authorized for detection and/or diagnosis of SARS-CoV-2 by FDA under an Emergency Use Authorization (EUA). This EUA will remain  in effect (meaning this test can be used) for the duration of the COVID-19 declaration under Se ction 564(b)(1) of the Act, 21 U.S.C. section 360bbb-3(b)(1), unless the authorization is terminated or revoked sooner.  Performed at [facility], [place] [address] [place], [place] [address]   Blood culture (routine x 2)     Status: Abnormal   Collection Time: 10/19/20  9:42 PM   Specimen: BLOOD  Result Value Ref Range Status   Specimen Description   Final    BLOOD BLOOD RIGHT HAND Performed at [facility] [address], [place], [place] [address]    Special Requests   Final    BOTTLES DRAWN AEROBIC ONLY Blood Culture adequate volume Performed at [facility] [address] [place], [place] [address]    Culture  Setup Time   Final    GRAM POSITIVE COCCI IN  CLUSTERS AEROBIC BOTTLE ONLY CRITICAL RESULT CALLED TO, READ BACK BY AND VERIFIED WITH: PHARMD ANH PHAM BY MESSAN H. AT 1505 ON 10/21/2020    Culture (A)  Final    STAPHYLOCOCCUS AURICULARIS THE SIGNIFICANCE OF ISOLATING THIS ORGANISM FROM A SINGLE SET OF BLOOD CULTURES WHEN MULTIPLE SETS ARE DRAWN IS UNCERTAIN. PLEASE NOTIFY THE MICROBIOLOGY DEPARTMENT WITHIN ONE WEEK IF SPECIATION AND SENSITIVITIES ARE REQUIRED. Performed at [facility], [place] [address], [place], [place] [address]    Report Status 10/23/2020 FINAL  Final  Blood Culture ID Panel (Reflexed)     Status: Abnormal   Collection Time: 10/19/20  9:42 PM  Result Value Ref Range Status   Enterococcus faecalis NOT DETECTED NOT DETECTED Final   Enterococcus Faecium NOT DETECTED NOT DETECTED Final   Listeria monocytogenes NOT DETECTED NOT DETECTED Final   Staphylococcus species DETECTED (A) NOT DETECTED Final    Comment: CRITICAL RESULT CALLED TO, READ BACK BY AND VERIFIED WITH: PHARMD ANH PHAM BY MESSAN H. AT 8016 ON 10/21/2020    Staphylococcus aureus (BCID) NOT DETECTED NOT DETECTED Final   Staphylococcus epidermidis NOT DETECTED NOT DETECTED Final   Staphylococcus lugdunensis NOT DETECTED NOT DETECTED Final   Streptococcus species NOT DETECTED NOT DETECTED Final   Streptococcus agalactiae NOT DETECTED NOT  DETECTED Final   Streptococcus pneumoniae NOT DETECTED NOT DETECTED Final   Streptococcus pyogenes NOT DETECTED NOT DETECTED Final   A.calcoaceticus-baumannii NOT DETECTED NOT DETECTED Final   Bacteroides fragilis NOT DETECTED NOT DETECTED Final   Enterobacterales NOT DETECTED NOT DETECTED Final   Enterobacter cloacae complex NOT DETECTED NOT DETECTED Final   Escherichia coli NOT DETECTED NOT DETECTED Final   Klebsiella aerogenes NOT DETECTED NOT DETECTED Final   Klebsiella oxytoca NOT DETECTED NOT DETECTED Final   Klebsiella pneumoniae NOT DETECTED NOT DETECTED Final   Proteus species NOT DETECTED NOT DETECTED  Final   Salmonella species NOT DETECTED NOT DETECTED Final   Serratia marcescens NOT DETECTED NOT DETECTED Final   Haemophilus influenzae NOT DETECTED NOT DETECTED Final   Neisseria meningitidis NOT DETECTED NOT DETECTED Final   Pseudomonas aeruginosa NOT DETECTED NOT DETECTED Final   Stenotrophomonas maltophilia NOT DETECTED NOT DETECTED Final   Candida albicans NOT DETECTED NOT DETECTED Final   Candida auris NOT DETECTED NOT DETECTED Final   Candida glabrata NOT DETECTED NOT DETECTED Final   Candida krusei NOT DETECTED NOT DETECTED Final   Candida parapsilosis NOT DETECTED NOT DETECTED Final   Candida tropicalis NOT DETECTED NOT DETECTED Final   Cryptococcus neoformans/gattii NOT DETECTED NOT DETECTED Final    Comment: Performed at [facility], [address], [place], [place] [address]  MRSA PCR Screening     Status: Abnormal   Collection Time: 10/21/20  4:15 PM   Specimen: Nasopharyngeal  Result Value Ref Range Status   MRSA by PCR POSITIVE (A) NEGATIVE Final    Comment:        The GeneXpert MRSA Assay (FDA approved for NASAL specimens only), is one component of a comprehensive MRSA colonization surveillance program. It is not intended to diagnose MRSA infection nor to guide or monitor treatment for MRSA infections. RESULT CALLED TO, READ BACK BY AND VERIFIED WITH: MIKAYLA HAILEY RN 10/21/20 '@1902'  BY P.HENDERSON Performed at [facility] [address], [place], [place] [address]   Culture, blood (routine x 2)     Status: None (Preliminary result)   Collection Time: 10/24/20  3:02 AM   Specimen: BLOOD  Result Value Ref Range Status   Specimen Description   Final    BLOOD BLOOD LEFT HAND Performed at [facility] [address], [place], [place] [address]    Special Requests   Final    BOTTLES DRAWN AEROBIC ONLY Blood Culture adequate volume Performed at [facility] [address] [place], [place]  [address]    Culture   Final    NO GROWTH 1 DAY Performed at [facility], [place] [address] [place], [place] [address]    Report Status PENDING  Incomplete  Culture, blood (routine x 2)     Status: None (Preliminary result)   Collection Time: 10/24/20  3:02 AM   Specimen: BLOOD  Result Value Ref Range Status   Specimen Description   Final    BLOOD BLOOD RIGHT HAND Performed at [facility] [address] [place], [place] [address]    Special Requests   Final    BOTTLES DRAWN AEROBIC ONLY Blood Culture adequate volume Performed at [facility] [address], [place], [place] [address]    Culture   Final    NO GROWTH 1 DAY Performed at [facility], [place] [address] [place], [place] [address]    Report Status  PENDING  Incomplete    Radiology Studies: No results found.    Taye T. [place]  If 7PM-7AM, please contact night-coverage [email or web address] 10/25/2020, 2:21 PM

## 2020-10-25 NOTE — Progress Notes (Signed)
Patient's HR continuing to sustain in low 40's. Notified Dr.Gonfa.   Will continue to monitor.

## 2020-10-25 NOTE — Consult Note (Signed)
[facility]  Total days of antibiotics 6               Reason for Consult:chronic sacral osteo, pressure ulcer    Referring Physician: Cyndia Skeeters  Principal Problem:   Pressure injury of skin with infection Active Problems:   HTN (hypertension)   Takotsubo cardiomyopathy   OSA (obstructive sleep apnea)   Cellulitis   Hypernatremia    HPI: [name] is a 80 y.o. female with history of scoliosis, chronic back pain, hx of HW spine, hx of stroke, debilitated, and bedbound. This past December 2021 she underwent debridement and removal of spinal fusion HW due in thoracic region,exposed bone and HW. Cx grew e.coli and strep anginosus. She was given 6 wks of cefazolin. She was seen in follow up with dr Juleen China. At that time had developed sacral pressure ulcer size of nickle. She was slated to keep cefazolin through jan 28th. She was readmitted on 3/10 for worsening drainage from sacral decubitus wound as well as hypernatremia of 154. Her wbc on admit was 11K. Esr of 61, somewhat improved from December. She has received 7 days of vancomycin and cefepime due to concern for sacral osteomyelitis. She had abd/pelvis CT that commented on chronic osteo of thoracolumbar region but the pressure ulcer is healing in that region. Did not notice any sacral abscess that would need debridement. She has santyl being placed on eschar to thoracic region and wet to dry dressing to sacral pressure ulcer. Pictures noted in dr Juliann Pares note/media today. No surrounding erythema.  Past Medical History:  Diagnosis Date  . Arthritis   . Asthma   . Chronic back pain   . Depression   . History of pulmonary embolus (PE)   . Hx-TIA (transient ischemic attack)   . Hyperlipemia   . Hypertension   . Personal history of DVT (deep vein thrombosis)   . Scoliosis   . Sleep apnea    cpap - settingsi at 2   . Stroke ([place])   . 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    Allergies:  Allergies   Allergen Reactions  . T-Pa [Alteplase] Swelling    Does okay with it if premedicated with benadryl.  . Codeine Itching    MEDICATIONS: . (feeding supplement) PROSource Plus  30 mL Oral BID BM  . vitamin C  1,000 mg Oral Daily  . busPIRone  15 mg Oral BID  . Chlorhexidine Gluconate Cloth  6 each Topical Daily  . collagenase   Topical Daily  . dronabinol  2.5 mg Oral QHS  . feeding supplement  1 Container Oral BID BM  . ferrous sulfate  325 mg Oral Q breakfast  . linaclotide  145 mcg Oral Daily  . mouth rinse  15 mL Mouth Rinse BID  . memantine  28 mg Oral QHS  . multivitamin with minerals  1 tablet Oral Daily  . mupirocin ointment  1 application Nasal BID  . sodium chloride flush  10-40 mL Intracatheter Q12H    Social History   Tobacco Use  . Smoking status: Former Smoker    Packs/day: 1.00    Years: 10.00    Pack years: 10.00    Types: Cigarettes    Quit date: 08/12/1973    Years since quitting: 47.2  . Smokeless tobacco: Never Used  Vaping Use  . Vaping Use: Never used  Substance Use Topics  . Alcohol use: No  . Drug use: No    Family History  Problem Relation Age of Onset  . Other Father        kidney problems     Review of Systems  Constitutional: Negative for fever, chills, diaphoresis, activity change, appetite change, fatigue and unexpected weight change.  HENT: Negative for congestion, sore throat, rhinorrhea, sneezing, trouble swallowing and sinus pressure.  Eyes: Negative for photophobia and visual disturbance.  Respiratory: Negative for cough, chest tightness, shortness of breath, wheezing and stridor.  Cardiovascular: Negative for chest pain, palpitations and leg swelling.  Gastrointestinal: Negative for nausea, vomiting, abdominal pain, diarrhea, constipation, blood in stool, abdominal distention and anal bleeding.  Genitourinary: Negative for dysuria, hematuria, flank pain and difficulty urinating.  Musculoskeletal:+deconditioned. +arthritis.  Negative for myalgias, back pain, joint swelling, and gait problem.  Skin: Negative for color change, pallor, rash and wound.  Neurological: Negative for dizziness, tremors, weakness and light-headedness.  Hematological: Negative for adenopathy. Does not bruise/bleed easily.  Psychiatric/Behavioral: Negative for behavioral problems, confusion, sleep disturbance, dysphoric mood, decreased concentration and agitation.     OBJECTIVE: Temp:  [97.8 F (36.6 C)-98.6 F (37 C)] 98 F (36.7 C) (03/16 1600) Pulse Rate:  [43-81] 50 (03/16 1100) Resp:  [9-14] 9 (03/16 1100) BP: (90-158)/(35-57) 142/50 (03/16 1100) SpO2:  [94 %-99 %] 99 % (03/16 1100) Physical Exam  Constitutional:  oriented to person, place only. appears chronically ill and deconditioned. No distress.  HENT: [place]/AT, PERRLA, no scleral icterus Mouth/Throat: Oropharynx is clear and moist. No oropharyngeal exudate.  Cardiovascular: Normal rate, regular rhythm and normal heart sounds. Exam reveals no gallop and no friction rub.  No murmur heard.  Pulmonary/Chest: Effort normal and breath sounds normal. No respiratory distress.  has no wheezes.  Abdominal: Soft. Bowel sounds are normal.  exhibits no distension. There is no tenderness.  gu = dry wick in place Skin = thoracic pressure ulcer, small eschar in 75% of wound bed no surrounding erythema. Pressure ulcer size of nickle on left ischial region with undermining. See photos attached from 3/16 Lymphadenopathy: no cervical adenopathy. No axillary adenopathy Neurological: alert and oriented to person, place Skin: Skin is warm and dry. No rash noted. No erythema.  Psychiatric: flat affect.  LABS: Results for orders placed or performed during the hospital encounter of 10/19/20 (from the past 48 hour(s))  Glucose, capillary     Status: Abnormal   Collection Time: 10/23/20 11:49 PM  Result Value Ref Range   Glucose-Capillary 52 (L) 70 - 99 mg/dL    Comment: Glucose reference range  applies only to samples taken after fasting for at least 8 hours.  Glucose, capillary     Status: Abnormal   Collection Time: 10/23/20 11:50 PM  Result Value Ref Range   Glucose-Capillary 63 (L) 70 - 99 mg/dL    Comment: Glucose reference range applies only to samples taken after fasting for at least 8 hours.  Glucose, capillary     Status: None   Collection Time: 10/24/20  1:05 AM  Result Value Ref Range   Glucose-Capillary 88 70 - 99 mg/dL    Comment: Glucose reference range applies only to samples taken after fasting for at least 8 hours.  Glucose, capillary     Status: None   Collection Time: 10/24/20  1:06 AM  Result Value Ref Range   Glucose-Capillary 84 70 - 99 mg/dL    Comment: Glucose reference range applies only to samples taken after fasting for at least 8 hours.  CBC     Status: Abnormal   Collection Time: 10/24/20  3:02 AM  Result Value Ref Range   WBC 7.5 4.0 - 10.5 K/uL   RBC 3.05 (L) 3.87 - 5.11 MIL/uL   Hemoglobin 9.5 (L) 12.0 - 15.0 g/dL   HCT 30.7 (L) 36.0 - 46.0 %   MCV 100.7 (H) 80.0 - 100.0 fL   MCH 31.1 26.0 - 34.0 pg   MCHC 30.9 30.0 - 36.0 g/dL   RDW 17.8 (H) 11.5 - 15.5 %   Platelets 102 (L) 150 - 400 K/uL    Comment: SPECIMEN CHECKED FOR CLOTS Immature Platelet Fraction may be clinically indicated, consider ordering this additional test [id number] CONSISTENT WITH PREVIOUS RESULT REPEATED TO VERIFY    nRBC 0.3 (H) 0.0 - 0.2 %    Comment: Performed at [facility], [place] [address], [place], [place] [address]  Comprehensive metabolic panel     Status: Abnormal   Collection Time: 10/24/20  3:02 AM  Result Value Ref Range   Sodium 146 (H) 135 - 145 mmol/L   Potassium 2.8 (L) 3.5 - 5.1 mmol/L   Chloride 114 (H) 98 - 111 mmol/L   CO2 23 22 - 32 mmol/L   Glucose, Bld 69 (L) 70 - 99 mg/dL    Comment: Glucose reference range applies only to samples taken after fasting for at least 8 hours.   BUN 10 8 - 23 mg/dL   Creatinine, Ser 0.38  (L) 0.44 - 1.00 mg/dL   Calcium 8.6 (L) 8.9 - 10.3 mg/dL   Total Protein 5.4 (L) 6.5 - 8.1 g/dL   Albumin 1.9 (L) 3.5 - 5.0 g/dL   AST 20 15 - 41 U/L   ALT 15 0 - 44 U/L   Alkaline Phosphatase 123 38 - 126 U/L   Total Bilirubin 1.1 0.3 - 1.2 mg/dL   GFR, Estimated >60 >60 mL/min    Comment: (NOTE) Calculated using the CKD-EPI Creatinine Equation (2021)    Anion gap 9 5 - 15    Comment: Performed at [facility], [place] [address], [place], [place] [address]  Culture, blood (routine x 2)     Status: None (Preliminary result)   Collection Time: 10/24/20  3:02 AM   Specimen: BLOOD  Result Value Ref Range   Specimen Description      BLOOD BLOOD LEFT HAND Performed at [facility] [address] [place], [place] [address]    Special Requests      BOTTLES DRAWN AEROBIC ONLY Blood Culture adequate volume Performed at [facility] [address] [place], [place] [address]    Culture      NO GROWTH 1 DAY Performed at [facility] [address], [place],  [place] [address]    Report Status PENDING   Culture, blood (routine x 2)     Status: None (Preliminary result)   Collection Time: 10/24/20  3:02 AM   Specimen: BLOOD  Result Value Ref Range   Specimen Description      BLOOD BLOOD RIGHT HAND Performed at [facility] [address], [place], [place] [address]    Special Requests      BOTTLES DRAWN AEROBIC ONLY Blood Culture adequate volume Performed at [facility] [address], [place], [place] [address]    Culture      NO GROWTH 1 DAY Performed at [facility] [address], [place],  [address]    Report Status PENDING   CBC  Status: Abnormal   Collection Time: 10/25/20  4:58 AM  Result Value Ref Range   WBC 7.9 4.0 - 10.5 K/uL   RBC 2.63 (L) 3.87 - 5.11 MIL/uL   Hemoglobin 8.2 (L) 12.0 - 15.0 g/dL   HCT 27.1 (L) 36.0 - 46.0 %   MCV 103.0 (H)  80.0 - 100.0 fL   MCH 31.2 26.0 - 34.0 pg   MCHC 30.3 30.0 - 36.0 g/dL   RDW 17.9 (H) 11.5 - 15.5 %   Platelets 140 (L) 150 - 400 K/uL   nRBC 0.0 0.0 - 0.2 %    Comment: Performed at [facility], [place] [address], [place], [place] [address]  Comprehensive metabolic panel     Status: Abnormal   Collection Time: 10/25/20  4:58 AM  Result Value Ref Range   Sodium 146 (H) 135 - 145 mmol/L   Potassium 2.9 (L) 3.5 - 5.1 mmol/L   Chloride 117 (H) 98 - 111 mmol/L   CO2 19 (L) 22 - 32 mmol/L   Glucose, Bld 73 70 - 99 mg/dL    Comment: Glucose reference range applies only to samples taken after fasting for at least 8 hours.   BUN 11 8 - 23 mg/dL   Creatinine, Ser 0.41 (L) 0.44 - 1.00 mg/dL   Calcium 8.8 (L) 8.9 - 10.3 mg/dL   Total Protein 5.0 (L) 6.5 - 8.1 g/dL   Albumin 1.8 (L) 3.5 - 5.0 g/dL   AST 17 15 - 41 U/L   ALT 15 0 - 44 U/L   Alkaline Phosphatase 109 38 - 126 U/L   Total Bilirubin 0.7 0.3 - 1.2 mg/dL   GFR, Estimated >60 >60 mL/min    Comment: (NOTE) Calculated using the CKD-EPI Creatinine Equation (2021)    Anion gap 10 5 - 15    Comment: Performed at [facility], [place] [address] [place], [place] [address]  Magnesium     Status: Abnormal   Collection Time: 10/25/20  4:58 AM  Result Value Ref Range   Magnesium 1.6 (L) 1.7 - 2.4 mg/dL    Comment: Performed at [facility], [place] [address] [place],  [address]    MICRO: reviewed IMAGING: No results found.  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 Inflammatory markers elevated with sed rate of 61. Appears significant deconditioned, weakness unable to turn in bed without assistance, protein-calorie malnutrition - recommend to stop iv vancomycin and cefepime, can switch to  amoxicillin 575m TID for additional 4 wk for chronic thoracic osteomyelitis. - sacral wound continue with local wound care   Protein-calorie moderate malnutrition = encourage higher protein intake  Currently on palliative care as outpatient as well, would address with family that these wounds are difficult to heal and can get worse if not rotating properly in bed and off loading.  Will have patient follow up with dr wJuleen Chinaor myself in 4 wk

## 2020-10-25 NOTE — TOC Progression Note (Signed)
Transition of Care [facility]) - Progression Note    Patient Details  Name: [name] MRN: [id number] Date of Birth: [birth year]  [facility]) CM/SW Contact  Golda Acre, RN Phone Number: 10/25/2020, 8:00 AM  Clinical Narrative:    Assessment & Plan:   Principal Problem:   Pressure injury of skin with infection Active Problems:   HTN (hypertension)   Takotsubo cardiomyopathy   OSA (obstructive sleep apnea)   Cellulitis   Hypernatremia  Infected multiple decubitus ulcer, POA Questionable staph bacteremia (rule out contaminant), likely POA - Empirically on IV vancomycin/cefepime - Follow cultures -follow repeat blood cultures -discussed with ID in passing, if repeat cultures cleared given staph species on blood culture this will likely be considered contaminant.  Hold off on further imaging or work-up at this time. - CT of the abdomen pelvis does not show any acute osteomyelitis at this time. 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 No evidence of significant soft tissue abscess collection. CT could not exclude chronic osteomyelitis.  Acute hypoxic respiratory failure 2/2 aspiration event, resolving - Likely secondary to aspiration event with notable changes on CTA 10/21/20 without overt PE - Continue supportive care/oxygen supplementation as necessary; wean oxygen as tolerated - Already on broad spectrum antibiotics as above -Speech continues to follow, currently recommending full liquid diet with medications  Acute metabolic encephalopathy, resolving  Chronic dementia, stable without behavioral disturbances  -Patient much more awake alert and orientable today -Baseline appears to be alert and oriented to person place and general situation/typically able to recognize family at bedside -Answering questions appropriately -On Aricept and Namenda.  Failure to thrive, poor oral  intake, AKI and hypovolemic hypernatremia Secondary to poor oral intake and free water deficit - Advance to clear liquid diet per speech today - Previous discussion for G tube placement (per son/daughter-in-law request) on hold given patient's tenuous status - Not sure a G-tube is appropriate given patient's history and high risk for complications should she pull it out given baseline dementia - patient son agreeable to hold off on PEG tube placement for now -family's main concern was calorie intake but have never spoken to a dietitian or nutritionist -Nutrition/dietary asked to follow along to assist with calorie count given limited p.o. intake will likely need high-calorie supplementation -appreciate insight and recommendations  PLAZN: unable to determine at this time due to poor condition. Expected Discharge Plan: Skilled Nursing Facility Barriers to Discharge: Continued Medical Work up  Expected Discharge Plan and Services Expected Discharge Plan: Skilled Nursing Facility   Discharge Planning Services: CM Consult   Living arrangements for the past 2 months: Single Family Home                                       Social Determinants of Health (SDOH) Interventions    Readmission Risk Interventions No flowsheet data found.

## 2020-10-26 DIAGNOSIS — E876 Hypokalemia: Secondary | ICD-10-CM | POA: Diagnosis not present

## 2020-10-26 DIAGNOSIS — E870 Hyperosmolality and hypernatremia: Secondary | ICD-10-CM | POA: Diagnosis not present

## 2020-10-26 DIAGNOSIS — I5181 Takotsubo syndrome: Secondary | ICD-10-CM | POA: Diagnosis not present

## 2020-10-26 DIAGNOSIS — L8994 Pressure ulcer of unspecified site, stage 4: Secondary | ICD-10-CM | POA: Diagnosis not present

## 2020-10-26 LAB — CBC
HCT: 29.6 % — ABNORMAL LOW (ref 36.0–46.0)
Hemoglobin: 9 g/dL — ABNORMAL LOW (ref 12.0–15.0)
MCH: 31 pg (ref 26.0–34.0)
MCHC: 30.4 g/dL (ref 30.0–36.0)
MCV: 102.1 fL — ABNORMAL HIGH (ref 80.0–100.0)
Platelets: 159 10*3/uL (ref 150–400)
RBC: 2.9 MIL/uL — ABNORMAL LOW (ref 3.87–5.11)
RDW: 17.7 % — ABNORMAL HIGH (ref 11.5–15.5)
WBC: 9.2 10*3/uL (ref 4.0–10.5)
nRBC: 0.2 % (ref 0.0–0.2)

## 2020-10-26 LAB — RENAL FUNCTION PANEL
Albumin: 1.8 g/dL — ABNORMAL LOW (ref 3.5–5.0)
Anion gap: 6 (ref 5–15)
BUN: 9 mg/dL (ref 8–23)
CO2: 20 mmol/L — ABNORMAL LOW (ref 22–32)
Calcium: 8.5 mg/dL — ABNORMAL LOW (ref 8.9–10.3)
Chloride: 113 mmol/L — ABNORMAL HIGH (ref 98–111)
Creatinine, Ser: 0.39 mg/dL — ABNORMAL LOW (ref 0.44–1.00)
GFR, Estimated: >60 mL/min (ref >60)
Glucose, Bld: 122 mg/dL — ABNORMAL HIGH (ref 70–99)
Phosphorus: 2.7 mg/dL (ref 2.5–4.6)
Potassium: 4 mmol/L (ref 3.5–5.1)
Sodium: 139 mmol/L (ref 135–145)

## 2020-10-26 LAB — MAGNESIUM: Magnesium: 1.8 mg/dL (ref 1.7–2.4)

## 2020-10-26 MED ORDER — APIXABAN 5 MG PO TABS
5.0000 mg | ORAL_TABLET | Freq: Two times a day (BID) | ORAL | Status: DC
  Administered 2020-10-26 – 2020-10-28 (×5): 5 mg via ORAL
  Filled 2020-10-26 (×5): qty 1

## 2020-10-26 MED ORDER — AMOXICILLIN 250 MG PO CAPS
500.0000 mg | ORAL_CAPSULE | Freq: Three times a day (TID) | ORAL | Status: DC
  Administered 2020-10-26 – 2020-11-07 (×32): 500 mg via ORAL
  Filled 2020-10-26 (×3): qty 2
  Filled 2020-10-26: qty 1
  Filled 2020-10-26 (×25): qty 2
  Filled 2020-10-26: qty 1
  Filled 2020-10-26 (×4): qty 2

## 2020-10-26 MED ORDER — OXYCODONE HCL 5 MG PO TABS
5.0000 mg | ORAL_TABLET | Freq: Four times a day (QID) | ORAL | Status: DC | PRN
  Administered 2020-10-26 – 2020-11-07 (×23): 5 mg via ORAL
  Filled 2020-10-26 (×24): qty 1

## 2020-10-26 NOTE — Progress Notes (Signed)
PROGRESS NOTE  [name] VDI:[id number] DOB: 1941-06-08   PCP: [name], MD  Patient is from: Home.  Bedbound and dependent for all ADLs at baseline.  DOA: 10/19/2020 LOS: 7  Chief complaints: Wound infection  Brief Narrative / Interim history: 80 year old F with PMH of dementia, recent vertebral osteomyelitis (completed antibiotics on 09/08/2020), multiple decubitus ulcer, DVT/PE on Eliquis, OSA, HTN and HLD brought to ED due to failure to thrive and admitted for hypernatremia, infected decubitus ulcer and failure to thrive.  Reportedly failed outpatient treatment with p.o. antibiotics.  She was treated with IV antibiotics and hypotonic IV fluid.   Hospital course complicated by acute hypoxemic respiratory failure thought to be due to aspiration in the setting of acute metabolic encephalopathy requiring transfer to ICU/stepdown for close monitoring.  Eventually weaned off oxygen.  SLP recommended full liquid diet.  CODE STATUS changed to DNR/DNI per discussion between previous attending and family.  PEG tube was entertained but not pursued.    Subjective: Seen and examined earlier this morning.  No major events overnight of this morning.  No complaints.  She was advanced to dysphagia 3 diet by SLP this morning.  She is oriented to self, place and month.   Objective: Vitals:   10/26/20 0500 10/26/20 0600 10/26/20 0800 10/26/20 1200  BP: (!) 139/59 (!) 143/60 (!) 133/56   Pulse: (!) 46 (!) 54 (!) 49   Resp: (!) 7 (!) 8 10   Temp:   (!) 97.5 F (36.4 C) 97.7 F (36.5 C)  TempSrc:   Oral Oral  SpO2: 99% 100% 100%   Weight:      Height:        Intake/Output Summary (Last 24 hours) at 10/26/2020 1327 Last data filed at 10/26/2020 0700 Gross per 24 hour  Intake 1758.46 ml  Output 950 ml  Net 808.46 ml   Filed Weights   10/19/20 2107  Weight: 61.3 kg    Examination:  GENERAL: No apparent distress.  Nontoxic. HEENT: MMM.  Vision and hearing grossly intact.  NECK:  Supple.  No apparent JVD.  RESP: 100% on RA.  No IWOB.  Fair aeration bilaterally. CVS:  RRR. Heart sounds normal.  ABD/GI/GU: BS+. Abd soft, NTND.  MSK/EXT: No apparent deformity but generalized weakness in all extremities. SKIN: Pressure skin injuries over thoracolumbar area, sacral and left buttock.  See picture in media NEURO: Awake and alert.  Oriented to self, place and months.  No apparent focal neuro deficit but generalized weakness. PSYCH: Calm. Normal affect.   Procedures:  None  Microbiology summarized: 3/10-COVID-19 PCR nonreactive 3/10-blood culture with Staphylococcus auricularis in aerobic bottles from single set 3/15-repeat blood cultures NGTD  Assessment & Plan: Infected multiple decubitus ulcer, POA -CT of the abdomen pelvis does not show any acute osteomyelitis at this time. 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 -Initial blood culture with staph auricularis in aerobic bottle likely contaminant.   -Repeat blood cultures NGTD. -On IV vancomycin and cefepime 3/10-3/16>> p.o. Amoxil 3/16>> for 4 weeks. -Outpatient follow-up with ID in 4 weeks.  Acute hypoxic respiratory failure 2/2 aspiration event, resolving -CTA chest on 3/12 with complete collapse of RLL with large amount of debris's in LLL bronchus  intermedius -Antibiotics as above. -Upgraded to dysphagia 3 diet by SLP -Continue aspiration precautions  Acute metabolic encephalopathy: Seems to have resolved. Chronic  dementia without behavioral disturbance -Oriented to self, place and person -Discontinued Aricept in the setting of bradycardia -Continue Namenda -Reorientation and delirium precautions  Sinus bradycardia: Improved after stopping Aricept. -Continue holding Aricept -Replenish electrolytes  Hypovolemic  hypernatremia: Resolved. -Discontinue IV fluid -Continue monitoring -Encourage p.o. intake  Hypokalemia/hypomagnesemia:  Refeeding syndrome?  Replaced and resolved. -Recheck in the morning  Essential hypertension: Normotensive. 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 -Continue home Eliquis  Normocytic anemia: H&H relatively stable. Recent Labs    08/03/20 0309 08/04/20 0500 10/19/20 1611 10/19/20 1711 10/21/20 0553 10/23/20 0912 10/24/20 0302 10/25/20 0458 10/25/20 1626 10/26/20 0606  HGB 8.2* 8.6* 11.7* 12.2 10.6* 12.3 9.5* 8.2* 8.7* 9.0*  -Monitor intermittently  Thrombocytopenia: Resolved. Recent Labs  Lab 10/19/20 1611 10/21/20 0553 10/23/20 0912 10/24/20 0302 10/25/20 0458 10/25/20 1626 10/26/20 0606  PLT 121* 126* 116* 102* 140* 150 159   Recurrent hypoglycemia: Likely due to poor p.o. intake.  Not on insulin.  Resolved. -Encourage p.o. intake  -Appreciate help by dietitian.  Goal of care: DNR/DNI per discussion between previous provider and patient's family.   Failure to thrive in the setting of poor oral intake -Upgraded to dysphagia-3 diet -Calorie count Body mass index is 24.72 kg/m. Nutrition Problem: Increased nutrient needs Etiology: acute illness,wound healing Signs/Symptoms: estimated needs Interventions: Hormel Shake,Boost Breeze,Prostat,Magic cup,MVI   Pressure skin injury: POA Pressure Injury 10/19/20 Vertebral column Left;Mid Unstageable - Full thickness tissue loss in which the base of the injury is covered by slough (yellow, tan, gray, green or brown) and/or eschar (tan, brown or black) in the wound bed. eschar d/t removal  (Active)  10/19/20 2020  Location: Vertebral column  Location Orientation: Left;Mid  Staging: Unstageable - Full thickness tissue loss in which the base of the injury is covered by slough (yellow, tan, gray, green or brown) and/or eschar (tan, brown or black) in  the wound bed.  Wound Description (Comments): eschar d/t removal of medical equipment which became infected (6x5)  Present on Admission: Yes     Pressure Injury 10/19/20 Buttocks Left Stage 4 - Full thickness tissue loss with exposed bone, tendon or muscle. 7x2x3, tunneling at 12'oclock-6cm, 9'oclock-1.5cm. (Active)  10/19/20 2020  Location: Buttocks  Location Orientation: Left  Staging: Stage 4 - Full thickness tissue loss with exposed bone, tendon or muscle.  Wound Description (Comments): 7x2x3, tunneling at 12'oclock-6cm, 9'oclock-1.5cm.  Present on Admission: Yes     Pressure Injury 10/19/20 Buttocks Mid Stage 3 -  Full thickness tissue loss. Subcutaneous fat may be visible but bone, tendon or muscle are NOT exposed. intragluteal cluster, 8.5x5 (Active)  10/19/20 2020  Location: Buttocks  Location Orientation: Mid  Staging: Stage 3 -  Full thickness tissue loss. Subcutaneous fat may be visible but bone, tendon or muscle are NOT exposed.  Wound Description (Comments): intragluteal cluster, 8.5x5  Present on Admission: Yes   DVT prophylaxis:  On Eliquis for VTE  Code Status: DNR/DNI Family Communication: Updated patient's son over the phone. Level of care: Telemetry Status is: Inpatient  Remains inpatient appropriate because:Inpatient level of care appropriate due to severity of illness. Monitor electrolytes and sodium off IV fluid. May need PEG tube   Dispo: The patient is from: Home              Anticipated d/c is to: Home              Patient currently is not medically stable to d/c.  Difficult to place patient No       Consultants:  Infectious disease   Sch Meds:  Scheduled Meds: . (feeding supplement) PROSource Plus  30 mL Oral BID BM  . amoxicillin  500 mg Oral Q8H  . apixaban  5 mg Oral BID  . vitamin C  1,000 mg Oral Daily  . busPIRone  15 mg Oral BID  . Chlorhexidine Gluconate Cloth  6 each Topical Daily  . collagenase   Topical Daily  . dronabinol   2.5 mg Oral QHS  . feeding supplement  1 Container Oral BID BM  . ferrous sulfate  325 mg Oral Q breakfast  . linaclotide  145 mcg Oral Daily  . mouth rinse  15 mL Mouth Rinse BID  . memantine  28 mg Oral QHS  . multivitamin with minerals  1 tablet Oral Daily  . sodium chloride flush  10-40 mL Intracatheter Q12H   Continuous Infusions: . sodium chloride 10 mL/hr at 10/25/20 0451   PRN Meds:.sodium chloride, acetaminophen, nitroGLYCERIN, oxyCODONE, sodium chloride flush  Antimicrobials: Anti-infectives (From admission, onward)   Start     Dose/Rate Route Frequency Ordered Stop   10/26/20 0930  amoxicillin (AMOXIL) capsule 500 mg        500 mg Oral Every 8 hours 10/26/20 0833 11/23/20 0559   10/21/20 2200  vancomycin (VANCOREADY) IVPB 750 mg/150 mL  Status:  Discontinued        750 mg 150 mL/hr over 60 Minutes Intravenous Every 24 hours 10/21/20 0739 10/26/20 0833   10/20/20 2200  vancomycin (VANCOREADY) IVPB 1250 mg/250 mL  Status:  Discontinued        1,250 mg 166.7 mL/hr over 90 Minutes Intravenous Every 24 hours 10/19/20 2253 10/21/20 0739   10/19/20 2315  ceFEPIme (MAXIPIME) 2 g in sodium chloride 0.9 % 100 mL IVPB  Status:  Discontinued        2 g 200 mL/hr over 30 Minutes Intravenous Every 12 hours 10/19/20 2226 10/26/20 0833   10/19/20 2300  vancomycin (VANCOREADY) IVPB 1000 mg/200 mL        1,000 mg 200 mL/hr over 60 Minutes Intravenous  Once 10/19/20 2209 10/20/20 0135   10/19/20 2300  ceFEPIme (MAXIPIME) 2 g in sodium chloride 0.9 % 100 mL IVPB  Status:  Discontinued        2 g 200 mL/hr over 30 Minutes Intravenous  Once 10/19/20 2209 10/19/20 2226   10/19/20 1615  ceFAZolin (ANCEF) IVPB 1 g/50 mL premix        1 g 100 mL/hr over 30 Minutes Intravenous  Once 10/19/20 1614 10/19/20 1931       I have personally reviewed the following labs and images: CBC: Recent Labs  Lab 10/19/20 1611 10/19/20 1711 10/23/20 0912 10/24/20 0302 10/25/20 0458 10/25/20 1626  10/26/20 0606  WBC 11.2*   < > 8.6 7.5 7.9 7.9 9.2  NEUTROABS 8.4*  --   --   --   --   --   --   HGB 11.7*   < > 12.3 9.5* 8.2* 8.7* 9.0*  HCT 40.2   < > 41.5 30.7* 27.1* 28.7* 29.6*  MCV 107.2*   < > 104.8* 100.7* 103.0* 102.5* 102.1*  PLT 121*   < > 116* 102* 140* 150 159   < > = values in this interval not displayed.   BMP &GFR Recent Labs  Lab 10/20/20 0950 10/20/20 2030 10/21/20 0553 10/22/20 0326 10/23/20 0912 10/24/20 0302 10/25/20 0458 10/25/20  1626 10/26/20 0606  NA 150*   < > 146* 147* 147* 146* 146* 142 139  K 3.0*   < > 3.8 3.7 2.6* 2.8* 2.9* 2.7* 4.0  CL 117*   < > 111 112* 114* 114* 117* 113* 113*  CO2 26   < > 24 25 21* 23 19* 21* 20*  GLUCOSE 184*   < > 97 88 68* 69* 73 137* 122*  BUN 13   < > 12 12 10 10 11 10 9   CREATININE 0.47   < > 0.46 0.48 0.40* 0.38* 0.41* 0.49 0.39*  CALCIUM 8.8*   < > 8.5* 8.8* 8.6* 8.6* 8.8* 8.6* 8.5*  MG 2.2  --  2.2 2.2  --   --  1.6*  --  1.8  PHOS 2.0*  --  4.1  --   --   --   --  1.5* 2.7   < > = values in this interval not displayed.   Estimated Creatinine Clearance: 48.3 mL/min (A) (by C-G formula based on SCr of 0.39 mg/dL (L)). Liver & Pancreas: Recent Labs  Lab 10/23/20 0912 10/24/20 0302 10/25/20 0458 10/25/20 1626 10/26/20 0606  AST 20 20 17   --   --   ALT 15 15 15   --   --   ALKPHOS 133* 123 109  --   --   BILITOT 1.1 1.1 0.7  --   --   PROT 5.8* 5.4* 5.0*  --   --   ALBUMIN 2.1* 1.9* 1.8* 1.8* 1.8*   No results for input(s): LIPASE, AMYLASE in the last 168 hours. No results for input(s): AMMONIA in the last 168 hours. Diabetic: No results for input(s): HGBA1C in the last 72 hours. Recent Labs  Lab 10/21/20 1608 10/23/20 2349 10/23/20 2350 10/24/20 0105 10/24/20 0106  GLUCAP 118* 52* 63* 88 84   Cardiac Enzymes: No results for input(s): CKTOTAL, CKMB, CKMBINDEX, TROPONINI in the last 168 hours. No results for input(s): PROBNP in the last 8760 hours. Coagulation Profile: No results for input(s):  INR, PROTIME in the last 168 hours. Thyroid Function Tests: No results for input(s): TSH, T4TOTAL, FREET4, T3FREE, THYROIDAB in the last 72 hours. Lipid Profile: No results for input(s): CHOL, HDL, LDLCALC, TRIG, CHOLHDL, LDLDIRECT in the last 72 hours. Anemia Panel: No results for input(s): VITAMINB12, FOLATE, FERRITIN, TIBC, IRON, RETICCTPCT in the last 72 hours. Urine analysis:    Component Value Date/Time   COLORURINE STRAW (A) 10/19/2020 1942   APPEARANCEUR CLEAR 10/19/2020 1942   LABSPEC 1.021 10/19/2020 1942   PHURINE 9.0 (H) 10/19/2020 1942   GLUCOSEU NEGATIVE 10/19/2020 1942   HGBUR NEGATIVE 10/19/2020 1942   BILIRUBINUR NEGATIVE 10/19/2020 1942   KETONESUR NEGATIVE 10/19/2020 1942   PROTEINUR NEGATIVE 10/19/2020 1942   UROBILINOGEN 1.0 02/14/2012 1054   NITRITE NEGATIVE 10/19/2020 1942   LEUKOCYTESUR NEGATIVE 10/19/2020 1942   Sepsis Labs: Invalid input(s): PROCALCITONIN, LACTICIDVEN  Microbiology: Recent Results (from the past 240 hour(s))  Blood culture (routine x 2)     Status: None   Collection Time: 10/19/20  5:02 PM   Specimen: BLOOD LEFT FOREARM  Result Value Ref Range Status   Specimen Description   Final    BLOOD LEFT FOREARM Performed at [facility], [address] [place], [place] [id number]    Special Requests   Final    BOTTLES DRAWN AEROBIC AND ANAEROBIC Blood Culture results may not be optimal due to an excessive volume of blood received in culture  bottles Performed at [facility], [address], [place], [place] [address]    Culture   Final    NO GROWTH 5 DAYS Performed at [facility], [address], [place], [place] [address]    Report Status 10/24/2020 FINAL  Final  SARS CORONAVIRUS 2 (TAT 6-24 HRS) Nasopharyngeal Nasopharyngeal Swab     Status: None   Collection Time: 10/19/20  5:14 PM   Specimen: Nasopharyngeal Swab  Result Value Ref Range Status   SARS Coronavirus 2 NEGATIVE NEGATIVE Final     Comment: (NOTE) SARS-CoV-2 target nucleic acids are NOT DETECTED.  The SARS-CoV-2 RNA is generally detectable in upper and lower respiratory specimens during the acute phase of infection. Negative results do not preclude SARS-CoV-2 infection, do not rule out co-infections with other pathogens, and should not be used as the sole basis for treatment or other patient management decisions. Negative results must be combined with clinical observations, patient history, and epidemiological information. The expected result is Negative.  Fact Sheet for Patients: HairSlick.no  Fact Sheet for Healthcare Providers: quierodirigir.com  This test is not yet approved or cleared by the [place] FDA and  has been authorized for detection and/or diagnosis of SARS-CoV-2 by FDA under an Emergency Use Authorization (EUA). This EUA will remain  in effect (meaning this test can be used) for the duration of the COVID-19 declaration under Se ction 564(b)(1) of the Act, 21 U.S.C. section 360bbb-3(b)(1), unless the authorization is terminated or revoked sooner.  Performed at [facility], [address] [place], [place] [address]   Blood culture (routine x 2)     Status: Abnormal   Collection Time: 10/19/20  9:42 PM   Specimen: BLOOD  Result Value Ref Range Status   Specimen Description   Final    BLOOD BLOOD RIGHT HAND Performed at [facility], [address], [place], [place] [address]    Special Requests   Final    BOTTLES DRAWN AEROBIC ONLY Blood Culture adequate volume Performed at [facility], [address], [place], [place] [address]    Culture  Setup Time   Final    GRAM POSITIVE COCCI IN CLUSTERS AEROBIC BOTTLE ONLY CRITICAL RESULT CALLED TO, READ BACK BY AND VERIFIED WITH: PHARMD ANH PHAM BY MESSAN H. AT [id number] ON 10/21/2020    Culture (A)  Final    STAPHYLOCOCCUS AURICULARIS THE  SIGNIFICANCE OF ISOLATING THIS ORGANISM FROM A SINGLE SET OF BLOOD CULTURES WHEN MULTIPLE SETS ARE DRAWN IS UNCERTAIN. PLEASE NOTIFY THE MICROBIOLOGY DEPARTMENT WITHIN ONE WEEK IF SPECIATION AND SENSITIVITIES ARE REQUIRED. Performed at [facility], [address] [place], [place] [address]    Report Status 10/23/2020 FINAL  Final  Blood Culture ID Panel (Reflexed)     Status: Abnormal   Collection Time: 10/19/20  9:42 PM  Result Value Ref Range Status   Enterococcus faecalis NOT DETECTED NOT DETECTED Final   Enterococcus Faecium NOT DETECTED NOT DETECTED Final   Listeria monocytogenes NOT DETECTED NOT DETECTED Final   Staphylococcus species DETECTED (A) NOT DETECTED Final    Comment: CRITICAL RESULT CALLED TO, READ BACK BY AND VERIFIED WITH: PHARMD ANH PHAM BY MESSAN H. AT [id number] ON 10/21/2020    Staphylococcus aureus (BCID) NOT DETECTED NOT DETECTED Final   Staphylococcus epidermidis NOT DETECTED NOT DETECTED Final   Staphylococcus lugdunensis NOT DETECTED NOT DETECTED Final   Streptococcus species NOT DETECTED NOT DETECTED Final   Streptococcus  agalactiae NOT DETECTED NOT DETECTED Final   Streptococcus pneumoniae NOT DETECTED NOT DETECTED Final   Streptococcus pyogenes NOT DETECTED NOT DETECTED Final   A.calcoaceticus-baumannii NOT DETECTED NOT DETECTED Final   Bacteroides fragilis NOT DETECTED NOT DETECTED Final   Enterobacterales NOT DETECTED NOT DETECTED Final   Enterobacter cloacae complex NOT DETECTED NOT DETECTED Final   Escherichia coli NOT DETECTED NOT DETECTED Final   Klebsiella aerogenes NOT DETECTED NOT DETECTED Final   Klebsiella oxytoca NOT DETECTED NOT DETECTED Final   Klebsiella pneumoniae NOT DETECTED NOT DETECTED Final   Proteus species NOT DETECTED NOT DETECTED Final   Salmonella species NOT DETECTED NOT DETECTED Final   Serratia marcescens NOT DETECTED NOT DETECTED Final   Haemophilus influenzae NOT DETECTED NOT DETECTED Final   Neisseria meningitidis NOT  DETECTED NOT DETECTED Final   Pseudomonas aeruginosa NOT DETECTED NOT DETECTED Final   Stenotrophomonas maltophilia NOT DETECTED NOT DETECTED Final   Candida albicans NOT DETECTED NOT DETECTED Final   Candida auris NOT DETECTED NOT DETECTED Final   Candida glabrata NOT DETECTED NOT DETECTED Final   Candida krusei NOT DETECTED NOT DETECTED Final   Candida parapsilosis NOT DETECTED NOT DETECTED Final   Candida tropicalis NOT DETECTED NOT DETECTED Final   Cryptococcus neoformans/gattii NOT DETECTED NOT DETECTED Final    Comment: Performed at [facility], [address] [place], [place] [address]  MRSA PCR Screening     Status: Abnormal   Collection Time: 10/21/20  4:15 PM   Specimen: Nasopharyngeal  Result Value Ref Range Status   MRSA by PCR POSITIVE (A) NEGATIVE Final    Comment:        The GeneXpert MRSA Assay (FDA approved for NASAL specimens only), is one component of a comprehensive MRSA colonization surveillance program. It is not intended to diagnose MRSA infection nor to guide or monitor treatment for MRSA infections. RESULT CALLED TO, READ BACK BY AND VERIFIED WITH: MIKAYLA HAILEY RN 10/21/20 @1902  BY P.HENDERSON Performed at [facility], [address] [place], [place]   Culture, blood (routine x 2)     Status: None (Preliminary result)   Collection Time: 10/24/20  3:02 AM   Specimen: BLOOD  Result Value Ref Range Status   Specimen Description   Final    BLOOD BLOOD LEFT HAND Performed at [facility], [address] [place], [place]    Special Requests   Final    BOTTLES DRAWN AEROBIC ONLY Blood Culture adequate volume Performed at [facility], [address], [place], [place]    Culture   Final    NO GROWTH 2 DAYS Performed at [facility], [address], [place], [place]    Report Status PENDING  Incomplete  Culture, blood (routine x 2)      Status: None (Preliminary result)   Collection Time: 10/24/20  3:02 AM   Specimen: BLOOD  Result Value Ref Range Status   Specimen Description   Final    BLOOD BLOOD RIGHT HAND Performed at [facility], [address] [place], [place]    Special Requests   Final    BOTTLES DRAWN AEROBIC ONLY Blood Culture adequate volume Performed at [facility], [address] [place], [place]    Culture   Final    NO GROWTH 2 DAYS Performed at [facility], [address] [place], [place]  Report Status PENDING  Incomplete    Radiology Studies: No results found.    Taye T. Gonfa [facility]  If 7PM-7AM, please contact night-coverage [email or web address] 10/26/2020, 1:27 PM

## 2020-10-26 NOTE — Progress Notes (Signed)
  Speech Language Pathology Treatment: Dysphagia  Patient Details Name: [name] MRN: [id number] DOB: 1941/07/06 Today's Date: 10/26/2020 Time: 2840-6986 SLP Time Calculation (min) (ACUTE ONLY): 39 min  Assessment / Plan / Recommendation Clinical Impression  Pt was alert throughout the evaluation leaning to the right.  She is oriented x3 with minimal cue for specific "Holiday".  Pt with quick response to questions and demonstrating humor today.    SLP follow up indicated to assess po tolerance, readiness for dietary advancement, reinforce helpful compensation strategies and educate pt.  She was willing to consume some breakfast items.    Oral mechanism exam was significant for left-sided facial and lingual weakness as well as left facial sensory deficit. Mandibular incisors present and pt reports she does not wear dentures but typically consumed regular textures.    No s/sx of aspiration were noted with any solids or liquids. Mastication was moderate-severely prolonged with mild retention on left.  Use of puree helpful to aid in oral transiting, clearance.  Pt noted with extra movement of rumination when oral cavity was clear.    Recommend advance diet to dys3/thin with strict precautions to maximal airway protection.  Hopefully dietary advancement may improve pt's intake.  Also advise to maximize liquid nutrition (pt states she likes Strawberry Ensure) for swallow efficiency.   New precaution signs posted in room and pt verbalized understanding using teach back to new recommendations.  Will follow up to assure po tolerance.  Thanks for allowing SLP to aid in pt's care plan.     HPI HPI: 80yo female admitted 10/19/20 due to discharge from decubitus ulcer. 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 Pt known to ST services      SLP Plan  Continue with current plan of care       Recommendations  Diet recommendations: Dysphagia 3 (mechanical soft);Thin liquid Liquids  provided via: Straw Medication Administration: Whole meds with puree Supervision: Full supervision/cueing for compensatory strategies;Staff to assist with self feeding Compensations: Minimize environmental distractions;Slow rate;Small sips/bites;Lingual sweep for clearance of pocketing;Other (Comment) (use puree to aid oral transit of masticated solids) Postural Changes and/or Swallow Maneuvers: Seated upright 90 degrees;Upright 30-60 min after meal                Oral Care Recommendations: Oral care QID;Oral care before and after PO;Staff/trained caregiver to provide oral care Follow up Recommendations: 24 hour supervision/assistance SLP Visit Diagnosis: Dysphagia, oropharyngeal phase (R13.12) Plan: Continue with current plan of care       GO                Chales Abrahams 10/26/2020, 10:04 AM  Rolena Infante, MS [facility] SLP Acute Rehab Services Office [phone] Pager [phone]

## 2020-10-26 NOTE — Progress Notes (Signed)
Calorie Count Note RD working remotely.  48 hour calorie count ordered.  Diet: Dysphagia 3, thin liquids Supplements: Hormel Shake ([employer]) BID, Boost Breeze BID, Prosource BID, Magic Cup BID   RD will follow-up 3/18 with day #1 results.       Trenton Gammon, MS, RD, LDN, CNSC Inpatient Clinical Dietitian RD pager # available in AMION  After hours/weekend pager # available in [facility]

## 2020-10-27 DIAGNOSIS — I5181 Takotsubo syndrome: Secondary | ICD-10-CM | POA: Diagnosis not present

## 2020-10-27 DIAGNOSIS — L8995 Pressure ulcer of unspecified site, unstageable: Secondary | ICD-10-CM | POA: Diagnosis not present

## 2020-10-27 DIAGNOSIS — E870 Hyperosmolality and hypernatremia: Secondary | ICD-10-CM | POA: Diagnosis not present

## 2020-10-27 DIAGNOSIS — L8994 Pressure ulcer of unspecified site, stage 4: Secondary | ICD-10-CM | POA: Diagnosis not present

## 2020-10-27 LAB — RENAL FUNCTION PANEL
Albumin: 1.8 g/dL — ABNORMAL LOW (ref 3.5–5.0)
Anion gap: 8 (ref 5–15)
BUN: 8 mg/dL (ref 8–23)
CO2: 22 mmol/L (ref 22–32)
Calcium: 8.8 mg/dL — ABNORMAL LOW (ref 8.9–10.3)
Chloride: 111 mmol/L (ref 98–111)
Creatinine, Ser: 0.37 mg/dL — ABNORMAL LOW (ref 0.44–1.00)
GFR, Estimated: >60 mL/min (ref >60)
Glucose, Bld: 77 mg/dL (ref 70–99)
Phosphorus: 2.1 mg/dL — ABNORMAL LOW (ref 2.5–4.6)
Potassium: 3 mmol/L — ABNORMAL LOW (ref 3.5–5.1)
Sodium: 141 mmol/L (ref 135–145)

## 2020-10-27 LAB — HEMOGLOBIN AND HEMATOCRIT, BLOOD
HCT: 29.7 % — ABNORMAL LOW (ref 36.0–46.0)
Hemoglobin: 9.2 g/dL — ABNORMAL LOW (ref 12.0–15.0)

## 2020-10-27 LAB — MAGNESIUM: Magnesium: 1.8 mg/dL (ref 1.7–2.4)

## 2020-10-27 MED ORDER — POTASSIUM PHOSPHATES 15 MMOLE/5ML IV SOLN
30.0000 mmol | Freq: Once | INTRAVENOUS | Status: AC
  Administered 2020-10-27: 30 mmol via INTRAVENOUS
  Filled 2020-10-27: qty 10

## 2020-10-27 MED ORDER — MAGNESIUM SULFATE 2 GM/50ML IV SOLN
2.0000 g | Freq: Once | INTRAVENOUS | Status: AC
  Administered 2020-10-27: 2 g via INTRAVENOUS
  Filled 2020-10-27: qty 50

## 2020-10-27 MED ORDER — POTASSIUM CHLORIDE CRYS ER 20 MEQ PO TBCR
40.0000 meq | EXTENDED_RELEASE_TABLET | ORAL | Status: AC
  Administered 2020-10-27 (×2): 40 meq via ORAL
  Filled 2020-10-27 (×2): qty 2

## 2020-10-27 MED ORDER — JUVEN PO PACK
1.0000 | PACK | Freq: Two times a day (BID) | ORAL | Status: DC
  Administered 2020-10-27 – 2020-10-29 (×5): 1 via ORAL
  Filled 2020-10-27 (×7): qty 1

## 2020-10-27 NOTE — Progress Notes (Signed)
Nutrition Follow-up  DOCUMENTATION CODES:   Not applicable  INTERVENTION:  - continue Boost Breeze BID, Magic Cup BID, Prosource Plus BID, and Hormel Shake BID.  - will order Juven BID, each packet provides 95 calories, 2.5 grams of protein (collagen), and 9.8 grams of carbohydrate (3 grams sugar); also contains 7 grams of L-arginine and L-glutamine, 300 mg vitamin C, 15 mg vitamin E, 1.2 mcg vitamin B-12, 9.5 mg zinc, 200 mg calcium, and 1.5 g  Calcium Beta-hydroxy-Beta-methylbutyrate to support wound healing.  - weigh patient today.  - RD will follow-up 3/21 for Calorie Count results.    NUTRITION DIAGNOSIS:   Increased nutrient needs related to acute illness,wound healing as evidenced by estimated needs. 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 She presented to the ED from home due to FTT with minimal PO intake recently. In the ED she was severely hypernatremic. She was a Rapid Response on 3/12 due to likely aspiration event. She was made DNR on 10/22/20. Family had requested PEG placement but due to patient's mental status, baseline, and long-term prognosis it is unclear that a PEG tube would be appropriate.  Patient assessed by this RD on 3/15 when she was on FLD. Diet was advanced yesterday at 1015 to Dysphagia 3, thin liquids and is documented to have consumed 5% of breakfast this AM; no other intakes documented this admission.   She has been accepting Boost Breeze 90% of the time offered (each supplement provides 250 kcal and 9 grams protein) and Prosource Plus 100% of the time offered (each supplement provides 100 kcal and  15 grams protein).  No envelope on door for collecting meal tickets for Calorie Count.  Patient sleeping and no family/visitors present.  She has not been weighed since 3/10.     Labs reviewed; K: 3 mmol/l, creatinine: 0.37 mg/dl, Ca: 8.8 mg/d. Medications reviewed; 1000 mg ascorbic acid/day, 2.5 mg marinol/night started 3/10, 325 mg ferrous sulfate/day, 2 g IV Mg sulfate x1 run 3/18, 1 tablet multivitamin with minerals/day, 40 mEq Klor-Con x2 doses 3/18, 30 mmol IV KPhos x1 run 3/18.   Diet Order:   Diet Order            DIET DYS 3 Room service appropriate? Yes; Fluid consistency: Thin  Diet effective now                 EDUCATION NEEDS:   Not appropriate for education at this time  Skin:  Skin Assessment: Skin Integrity Issues: Skin Integrity Issues:: Stage III,Stage IV,Unstageable Stage III: mid buttocks Stage IV: L buttocks Unstageable: full thickness to vertebral column  Last BM:  3/18 (type 6 x1)  Height:   Ht Readings from Last 1 Encounters:  10/19/20 5\' 2"  (1.575 m)    Weight:   Wt Readings from Last 1 Encounters:  10/19/20 61.3 kg    Estimated Nutritional Needs:  Kcal:  1850-2050 kcal Protein:  95-110 grams Fluid:  >/= 2.2 L/day     12/19/20, MS, RD, LDN, CNSC Inpatient Clinical Dietitian RD pager # available in AMION  After hours/weekend pager # available in [facility]

## 2020-10-27 NOTE — Progress Notes (Signed)
PROGRESS NOTE  [name] NOM:[id number] DOB: Jan 10, 1941   PCP: [name], MD  Patient is from: Home.  Bedbound and dependent for all ADLs at baseline.  DOA: 10/19/2020 LOS: 8  Chief complaints: Wound infection  Brief Narrative / Interim history: 80 year old F with PMH of dementia, recent vertebral osteomyelitis (completed antibiotics on 09/08/2020), multiple decubitus ulcer, DVT/PE on Eliquis, OSA, HTN and HLD brought to ED due to failure to thrive and admitted for hypernatremia, infected decubitus ulcer and failure to thrive.  Reportedly failed outpatient treatment with p.o. antibiotics.  She was treated with IV antibiotics and hypotonic IV fluid.   Hospital course complicated by acute hypoxemic respiratory failure thought to be due to aspiration in the setting of acute metabolic encephalopathy requiring transfer to ICU/stepdown for close monitoring.  Eventually weaned off oxygen.  SLP recommended full liquid diet.  CODE STATUS changed to DNR/DNI per discussion between previous attending and family. Main issue is poor p.o. intake.  She is currently on dysphagia 3 diet with calorie count before deciding about PEG tube.   Subjective: Seen and examined earlier this morning.  No major events overnight of this morning.  No complaints.  She denies pain, shortness of breath, nausea or vomiting.  Per RN, p.o. intake remains poor.  Objective: Vitals:   10/26/20 2101 10/27/20 0110 10/27/20 0508 10/27/20 1222  BP: 128/64 128/60 129/64 130/67  Pulse: (!) 56 (!) 51 69 (!) 54  Resp: 16 16 17 16   Temp: 98.3 F (36.8 C) 98.6 F (37 C) 97.6 F (36.4 C) 97.8 F (36.6 C)  TempSrc: Oral Oral Oral Oral  SpO2: 98% 100% 100% 100%  Weight:      Height:        Intake/Output Summary (Last 24 hours) at 10/27/2020 1322 Last data filed at 10/27/2020 1108 Gross per 24 hour  Intake 240 ml  Output 1651 ml  Net -1411 ml   Filed Weights   10/19/20 2107  Weight: 61.3 kg     Examination:  GENERAL: No apparent distress.  Nontoxic. HEENT: MMM.  Vision and hearing grossly intact.  NECK: Supple.  No apparent JVD.  RESP: 100% on RA.  No IWOB.  Fair aeration bilaterally. CVS:  RRR. Heart sounds normal.  ABD/GI/GU: BS+. Abd soft, NTND.  MSK/EXT:  Moves extremities. No apparent deformity. No edema.  SKIN: Pressure skin injuries over upper lumbar area, sacral and left buttock.  See picture in media. NEURO: Awake, alert and oriented fairly.  No apparent focal neuro deficit. PSYCH: Calm. Normal affect.   Procedures:  None  Microbiology summarized: 3/10-COVID-19 PCR nonreactive 3/10-blood culture with Staphylococcus auricularis in aerobic bottles from single set 3/15-repeat blood cultures NGTD  Assessment & Plan: Infected multiple decubitus ulcer, POA -CT of the abdomen pelvis does not show any acute osteomyelitis at this time. 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 -Initial blood culture with staph auricularis in aerobic bottle likely contaminant.   -Repeat blood cultures NGTD. -Vanco and cefepime 3/10-3/16>> p.o. Amoxil 3/16>> for 4 weeks. -Outpatient follow-up with ID in 4 weeks. -Optimize nutrition for better wound healing  Acute hypoxic respiratory failure 2/2 aspiration event, resolving -CTA chest on 3/12 with complete collapse of RLL with large debris's in LLL bronchus  intermedius -Antibiotics as above. -Upgraded to dysphagia-3 diet by SLP -Continue aspiration precautions  Acute metabolic encephalopathy: Seems to have  resolved. Chronic dementia without behavioral disturbance -Oriented to self, place and person -Discontinued Aricept in the setting of bradycardia -Continue Namenda -Reorientation and delirium precautions  Sinus bradycardia: Improved after  stopping Aricept. -Continue holding Aricept -Replenish electrolytes  Hypovolemic hypernatremia: Likely due to poor p.o. intake.  Resolved. -Discontinue IV fluid -Continue monitoring -Encourage p.o. intake  Hypokalemia/hypophosphatemia/hypomagnesemia:  Refeeding syndrome? -Replenish and recheck  Essential hypertension: Normotensive. 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 -Continue home Eliquis  Normocytic anemia: H&H relatively stable. Recent Labs    08/04/20 0500 10/19/20 1611 10/19/20 1711 10/21/20 0553 10/23/20 0912 10/24/20 0302 10/25/20 0458 10/25/20 1626 10/26/20 0606 10/27/20 0303  HGB 8.6* 11.7* 12.2 10.6* 12.3 9.5* 8.2* 8.7* 9.0* 9.2*  -Monitor intermittently  Thrombocytopenia: Resolved. Recent Labs  Lab 10/21/20 0553 10/23/20 0912 10/24/20 0302 10/25/20 0458 10/25/20 1626 10/26/20 0606  PLT 126* 116* 102* 140* 150 159   Recurrent hypoglycemia: Likely due to poor p.o. intake.  Not on insulin.  Resolved. -Encourage p.o. intake  -Appreciate help by dietitian.  Goal of care: DNR/DNI per discussion between previous provider and patient's family.   Failure to thrive in the setting of poor oral intake -Upgraded to dysphagia-3 diet -Calorie count started on 3/17 -May have to pursue PEG tube if no improvement in caloric intake. Body mass index is 24.72 kg/m. Nutrition Problem: Increased nutrient needs Etiology: acute illness,wound healing Signs/Symptoms: estimated needs Interventions: Hormel Shake,Boost Breeze,Prostat,Magic cup,MVI,Juven   Pressure skin injury: POA Pressure Injury 10/19/20 Vertebral column Left;Mid Unstageable - Full thickness tissue loss in which the base of the injury is covered by slough (yellow, tan, gray, green or brown) and/or eschar (tan, brown or black) in the wound bed. eschar d/t removal  (Active)  10/19/20 2020  Location: Vertebral column  Location Orientation: Left;Mid   Staging: Unstageable - Full thickness tissue loss in which the base of the injury is covered by slough (yellow, tan, gray, green or brown) and/or eschar (tan, brown or black) in the wound bed.  Wound Description (Comments): eschar d/t removal of medical equipment which became infected (6x5)  Present on Admission: Yes     Pressure Injury 10/19/20 Buttocks Left Stage 4 - Full thickness tissue loss with exposed bone, tendon or muscle. 7x2x3, tunneling at 12'oclock-6cm, 9'oclock-1.5cm. (Active)  10/19/20 2020  Location: Buttocks  Location Orientation: Left  Staging: Stage 4 - Full thickness tissue loss with exposed bone, tendon or muscle.  Wound Description (Comments): 7x2x3, tunneling at 12'oclock-6cm, 9'oclock-1.5cm.  Present on Admission: Yes     Pressure Injury 10/19/20 Buttocks Mid Stage 3 -  Full thickness tissue loss. Subcutaneous fat may be visible but bone, tendon or muscle are NOT exposed. intragluteal cluster, 8.5x5 (Active)  10/19/20 2020  Location: Buttocks  Location Orientation: Mid  Staging: Stage 3 -  Full thickness tissue loss. Subcutaneous fat may be visible but bone, tendon or muscle are NOT exposed.  Wound Description (Comments): intragluteal cluster, 8.5x5  Present on Admission: Yes   DVT prophylaxis:  On Eliquis for VTE  Code Status: DNR/DNI Family Communication: Updated patient's son over the phone on 3/17. Level of care: Telemetry Status is: Inpatient  Remains inpatient appropriate because:Persistent severe electrolyte disturbances and Inpatient level of care appropriate due to severity of illness. May need PEG tube if no adequate calorie intake   Dispo: The patient is from: Home              Anticipated d/c is to: Home  Patient currently is not medically stable to d/c.   Difficult to place patient No       Consultants:  Infectious disease   Sch Meds:  Scheduled Meds: . (feeding supplement) PROSource Plus  30 mL Oral BID BM  .  amoxicillin  500 mg Oral Q8H  . apixaban  5 mg Oral BID  . vitamin C  1,000 mg Oral Daily  . busPIRone  15 mg Oral BID  . Chlorhexidine Gluconate Cloth  6 each Topical Daily  . collagenase   Topical Daily  . dronabinol  2.5 mg Oral QHS  . feeding supplement  1 Container Oral BID BM  . ferrous sulfate  325 mg Oral Q breakfast  . linaclotide  145 mcg Oral Daily  . mouth rinse  15 mL Mouth Rinse BID  . memantine  28 mg Oral QHS  . multivitamin with minerals  1 tablet Oral Daily  . potassium chloride  40 mEq Oral Q4H  . sodium chloride flush  10-40 mL Intracatheter Q12H   Continuous Infusions: . sodium chloride 10 mL/hr at 10/25/20 0451  . potassium PHOSPHATE IVPB (in mmol) 30 mmol (10/27/20 1108)   PRN Meds:.sodium chloride, acetaminophen, nitroGLYCERIN, oxyCODONE, sodium chloride flush  Antimicrobials: Anti-infectives (From admission, onward)   Start     Dose/Rate Route Frequency Ordered Stop   10/26/20 0930  amoxicillin (AMOXIL) capsule 500 mg        500 mg Oral Every 8 hours 10/26/20 0833 11/23/20 0559   10/21/20 2200  vancomycin (VANCOREADY) IVPB 750 mg/150 mL  Status:  Discontinued        750 mg 150 mL/hr over 60 Minutes Intravenous Every 24 hours 10/21/20 0739 10/26/20 0833   10/20/20 2200  vancomycin (VANCOREADY) IVPB 1250 mg/250 mL  Status:  Discontinued        1,250 mg 166.7 mL/hr over 90 Minutes Intravenous Every 24 hours 10/19/20 2253 10/21/20 0739   10/19/20 2315  ceFEPIme (MAXIPIME) 2 g in sodium chloride 0.9 % 100 mL IVPB  Status:  Discontinued        2 g 200 mL/hr over 30 Minutes Intravenous Every 12 hours 10/19/20 2226 10/26/20 0833   10/19/20 2300  vancomycin (VANCOREADY) IVPB 1000 mg/200 mL        1,000 mg 200 mL/hr over 60 Minutes Intravenous  Once 10/19/20 2209 10/20/20 0135   10/19/20 2300  ceFEPIme (MAXIPIME) 2 g in sodium chloride 0.9 % 100 mL IVPB  Status:  Discontinued        2 g 200 mL/hr over 30 Minutes Intravenous  Once 10/19/20 2209 10/19/20 2226    10/19/20 1615  ceFAZolin (ANCEF) IVPB 1 g/50 mL premix        1 g 100 mL/hr over 30 Minutes Intravenous  Once 10/19/20 1614 10/19/20 1931       I have personally reviewed the following labs and images: CBC: Recent Labs  Lab 10/23/20 0912 10/24/20 0302 10/25/20 0458 10/25/20 1626 10/26/20 0606 10/27/20 0303  WBC 8.6 7.5 7.9 7.9 9.2  --   HGB 12.3 9.5* 8.2* 8.7* 9.0* 9.2*  HCT 41.5 30.7* 27.1* 28.7* 29.6* 29.7*  MCV 104.8* 100.7* 103.0* 102.5* 102.1*  --   PLT 116* 102* 140* 150 159  --    BMP &GFR Recent Labs  Lab 10/21/20 0553 10/22/20 0326 10/23/20 0912 10/24/20 0302 10/25/20 0458 10/25/20 1626 10/26/20 0606 10/27/20 0303  NA 146* 147*   < > 146* 146* 142 139 141  K 3.8 3.7   < >  2.8* 2.9* 2.7* 4.0 3.0*  CL 111 112*   < > 114* 117* 113* 113* 111  CO2 24 25   < > 23 19* 21* 20* 22  GLUCOSE 97 88   < > 69* 73 137* 122* 77  BUN 12 12   < > 10 11 10 9 8   CREATININE 0.46 0.48   < > 0.38* 0.41* 0.49 0.39* 0.37*  CALCIUM 8.5* 8.8*   < > 8.6* 8.8* 8.6* 8.5* 8.8*  MG 2.2 2.2  --   --  1.6*  --  1.8 1.8  PHOS 4.1  --   --   --   --  1.5* 2.7 2.1*   < > = values in this interval not displayed.   Estimated Creatinine Clearance: 48.3 mL/min (A) (by C-G formula based on SCr of 0.37 mg/dL (L)). Liver & Pancreas: Recent Labs  Lab 10/23/20 0912 10/24/20 0302 10/25/20 0458 10/25/20 1626 10/26/20 0606 10/27/20 0303  AST 20 20 17   --   --   --   ALT 15 15 15   --   --   --   ALKPHOS 133* 123 109  --   --   --   BILITOT 1.1 1.1 0.7  --   --   --   PROT 5.8* 5.4* 5.0*  --   --   --   ALBUMIN 2.1* 1.9* 1.8* 1.8* 1.8* 1.8*   No results for input(s): LIPASE, AMYLASE in the last 168 hours. No results for input(s): AMMONIA in the last 168 hours. Diabetic: No results for input(s): HGBA1C in the last 72 hours. Recent Labs  Lab 10/21/20 1608 10/23/20 2349 10/23/20 2350 10/24/20 0105 10/24/20 0106  GLUCAP 118* 52* 63* 88 84   Cardiac Enzymes: No results for input(s):  CKTOTAL, CKMB, CKMBINDEX, TROPONINI in the last 168 hours. No results for input(s): PROBNP in the last 8760 hours. Coagulation Profile: No results for input(s): INR, PROTIME in the last 168 hours. Thyroid Function Tests: No results for input(s): TSH, T4TOTAL, FREET4, T3FREE, THYROIDAB in the last 72 hours. Lipid Profile: No results for input(s): CHOL, HDL, LDLCALC, TRIG, CHOLHDL, LDLDIRECT in the last 72 hours. Anemia Panel: No results for input(s): VITAMINB12, FOLATE, FERRITIN, TIBC, IRON, RETICCTPCT in the last 72 hours. Urine analysis:    Component Value Date/Time   COLORURINE STRAW (A) 10/19/2020 1942   APPEARANCEUR CLEAR 10/19/2020 1942   LABSPEC 1.021 10/19/2020 1942   PHURINE 9.0 (H) 10/19/2020 1942   GLUCOSEU NEGATIVE 10/19/2020 1942   HGBUR NEGATIVE 10/19/2020 1942   BILIRUBINUR NEGATIVE 10/19/2020 1942   KETONESUR NEGATIVE 10/19/2020 1942   PROTEINUR NEGATIVE 10/19/2020 1942   UROBILINOGEN 1.0 02/14/2012 1054   NITRITE NEGATIVE 10/19/2020 1942   LEUKOCYTESUR NEGATIVE 10/19/2020 1942   Sepsis Labs: Invalid input(s): PROCALCITONIN, LACTICIDVEN  Microbiology: Recent Results (from the past 240 hour(s))  Blood culture (routine x 2)     Status: None   Collection Time: 10/19/20  5:02 PM   Specimen: BLOOD LEFT FOREARM  Result Value Ref Range Status   Specimen Description   Final    BLOOD LEFT FOREARM Performed at [facility], [address] [place], [place] [address]    Special Requests   Final    BOTTLES DRAWN AEROBIC AND ANAEROBIC Blood Culture results may not be optimal due to an excessive volume of blood received in culture bottles Performed at [facility], [address] [place], [place] [address]    Culture  Final    NO GROWTH 5 DAYS Performed at [facility], [address], [place], [place] [address]    Report Status 10/24/2020 FINAL  Final  SARS CORONAVIRUS 2 (TAT 6-24 HRS) Nasopharyngeal Nasopharyngeal Swab      Status: None   Collection Time: 10/19/20  5:14 PM   Specimen: Nasopharyngeal Swab  Result Value Ref Range Status   SARS Coronavirus 2 NEGATIVE NEGATIVE Final    Comment: (NOTE) SARS-CoV-2 target nucleic acids are NOT DETECTED.  The SARS-CoV-2 RNA is generally detectable in upper and lower respiratory specimens during the acute phase of infection. Negative results do not preclude SARS-CoV-2 infection, do not rule out co-infections with other pathogens, and should not be used as the sole basis for treatment or other patient management decisions. Negative results must be combined with clinical observations, patient history, and epidemiological information. The expected result is Negative.  Fact Sheet for Patients: HairSlick.no  Fact Sheet for Healthcare Providers: quierodirigir.com  This test is not yet approved or cleared by the [place] FDA and  has been authorized for detection and/or diagnosis of SARS-CoV-2 by FDA under an Emergency Use Authorization (EUA). This EUA will remain  in effect (meaning this test can be used) for the duration of the COVID-19 declaration under Se ction 564(b)(1) of the Act, 21 U.S.C. section 360bbb-3(b)(1), unless the authorization is terminated or revoked sooner.  Performed at [facility], [address], [place], [place] [address]   Blood culture (routine x 2)     Status: Abnormal   Collection Time: 10/19/20  9:42 PM   Specimen: BLOOD  Result Value Ref Range Status   Specimen Description   Final    BLOOD BLOOD RIGHT HAND Performed at [facility], [address] [place], [place] [address]    Special Requests   Final    BOTTLES DRAWN AEROBIC ONLY Blood Culture adequate volume Performed at [facility], [address], [place], [place] [address]    Culture  Setup Time   Final    GRAM POSITIVE COCCI IN CLUSTERS AEROBIC BOTTLE  ONLY CRITICAL RESULT CALLED TO, READ BACK BY AND VERIFIED WITH: PHARMD ANH PHAM BY MESSAN H. AT [id number] ON 10/21/2020    Culture (A)  Final    STAPHYLOCOCCUS AURICULARIS THE SIGNIFICANCE OF ISOLATING THIS ORGANISM FROM A SINGLE SET OF BLOOD CULTURES WHEN MULTIPLE SETS ARE DRAWN IS UNCERTAIN. PLEASE NOTIFY THE MICROBIOLOGY DEPARTMENT WITHIN ONE WEEK IF SPECIATION AND SENSITIVITIES ARE REQUIRED. Performed at [facility], [address] [place], [place] [address]    Report Status 10/23/2020 FINAL  Final  Blood Culture ID Panel (Reflexed)     Status: Abnormal   Collection Time: 10/19/20  9:42 PM  Result Value Ref Range Status   Enterococcus faecalis NOT DETECTED NOT DETECTED Final   Enterococcus Faecium NOT DETECTED NOT DETECTED Final   Listeria monocytogenes NOT DETECTED NOT DETECTED Final   Staphylococcus species DETECTED (A) NOT DETECTED Final    Comment: CRITICAL RESULT CALLED TO, READ BACK BY AND VERIFIED WITH: PHARMD ANH PHAM BY MESSAN H. AT [id number] ON 10/21/2020    Staphylococcus aureus (BCID) NOT DETECTED NOT DETECTED Final   Staphylococcus epidermidis NOT DETECTED NOT DETECTED Final   Staphylococcus lugdunensis NOT DETECTED NOT DETECTED Final   Streptococcus species NOT DETECTED NOT DETECTED Final   Streptococcus agalactiae NOT DETECTED NOT DETECTED Final   Streptococcus pneumoniae NOT DETECTED NOT DETECTED Final   Streptococcus pyogenes NOT  DETECTED NOT DETECTED Final   A.calcoaceticus-baumannii NOT DETECTED NOT DETECTED Final   Bacteroides fragilis NOT DETECTED NOT DETECTED Final   Enterobacterales NOT DETECTED NOT DETECTED Final   Enterobacter cloacae complex NOT DETECTED NOT DETECTED Final   Escherichia coli NOT DETECTED NOT DETECTED Final   Klebsiella aerogenes NOT DETECTED NOT DETECTED Final   Klebsiella oxytoca NOT DETECTED NOT DETECTED Final   Klebsiella pneumoniae NOT DETECTED NOT DETECTED Final   Proteus species NOT DETECTED NOT DETECTED Final   Salmonella species  NOT DETECTED NOT DETECTED Final   Serratia marcescens NOT DETECTED NOT DETECTED Final   Haemophilus influenzae NOT DETECTED NOT DETECTED Final   Neisseria meningitidis NOT DETECTED NOT DETECTED Final   Pseudomonas aeruginosa NOT DETECTED NOT DETECTED Final   Stenotrophomonas maltophilia NOT DETECTED NOT DETECTED Final   Candida albicans NOT DETECTED NOT DETECTED Final   Candida auris NOT DETECTED NOT DETECTED Final   Candida glabrata NOT DETECTED NOT DETECTED Final   Candida krusei NOT DETECTED NOT DETECTED Final   Candida parapsilosis NOT DETECTED NOT DETECTED Final   Candida tropicalis NOT DETECTED NOT DETECTED Final   Cryptococcus neoformans/gattii NOT DETECTED NOT DETECTED Final    Comment: Performed at [facility], [address] [place], [place] [address]  MRSA PCR Screening     Status: Abnormal   Collection Time: 10/21/20  4:15 PM   Specimen: Nasopharyngeal  Result Value Ref Range Status   MRSA by PCR POSITIVE (A) NEGATIVE Final    Comment:        The GeneXpert MRSA Assay (FDA approved for NASAL specimens only), is one component of a comprehensive MRSA colonization surveillance program. It is not intended to diagnose MRSA infection nor to guide or monitor treatment for MRSA infections. RESULT CALLED TO, READ BACK BY AND VERIFIED WITH: MIKAYLA HAILEY RN 10/21/20 @1902  BY P.HENDERSON Performed at [facility], [address] [place], [place]   Culture, blood (routine x 2)     Status: None (Preliminary result)   Collection Time: 10/24/20  3:02 AM   Specimen: BLOOD  Result Value Ref Range Status   Specimen Description   Final    BLOOD BLOOD LEFT HAND Performed at [facility], [address], [place], [place]    Special Requests   Final    BOTTLES DRAWN AEROBIC ONLY Blood Culture adequate volume Performed at [facility], [address] [place], [place]    Culture   Final     NO GROWTH 3 DAYS Performed at [facility], [address], [place], [place]    Report Status PENDING  Incomplete  Culture, blood (routine x 2)     Status: None (Preliminary result)   Collection Time: 10/24/20  3:02 AM   Specimen: BLOOD  Result Value Ref Range Status   Specimen Description   Final    BLOOD BLOOD RIGHT HAND Performed at [facility], [address], [place], [place]    Special Requests   Final    BOTTLES DRAWN AEROBIC ONLY Blood Culture adequate volume Performed at [facility], [address] [place], [place]    Culture   Final    NO GROWTH 3 DAYS Performed at [facility], [address], [place], [place]    Report Status PENDING  Incomplete    Radiology Studies: No results found.    Taye T.  [facility]  If 7PM-7AM, please contact night-coverage [email or web address] 10/27/2020, 1:22 PM

## 2020-10-27 NOTE — Plan of Care (Signed)
°  Problem: Activity: °Goal: Risk for activity intolerance will decrease °Outcome: Progressing °  °Problem: Nutrition: °Goal: Adequate nutrition will be maintained °Outcome: Progressing °  °Problem: Skin Integrity: °Goal: Risk for impaired skin integrity will decrease °Outcome: Progressing °  °

## 2020-10-27 NOTE — Plan of Care (Signed)
  Problem: Clinical Measurements: Goal: Will remain free from infection Outcome: Progressing Goal: Respiratory complications will improve Outcome: Progressing   Problem: Nutrition: Goal: Adequate nutrition will be maintained Outcome: Progressing   Problem: Skin Integrity: Goal: Risk for impaired skin integrity will decrease Outcome: Progressing   

## 2020-10-27 NOTE — Care Management Important Message (Signed)
Important Message  Patient Details IM Letter given to the Patient Name: [name] MRN: [id number] Date of Birth: [birth year]   Medicare Important Message Given:  Yes     Caren Macadam 10/27/2020, 11:10 AM

## 2020-10-28 DIAGNOSIS — E870 Hyperosmolality and hypernatremia: Secondary | ICD-10-CM | POA: Diagnosis not present

## 2020-10-28 DIAGNOSIS — L8994 Pressure ulcer of unspecified site, stage 4: Secondary | ICD-10-CM | POA: Diagnosis not present

## 2020-10-28 DIAGNOSIS — L8995 Pressure ulcer of unspecified site, unstageable: Secondary | ICD-10-CM | POA: Diagnosis not present

## 2020-10-28 DIAGNOSIS — I5181 Takotsubo syndrome: Secondary | ICD-10-CM | POA: Diagnosis not present

## 2020-10-28 LAB — RENAL FUNCTION PANEL
Albumin: 1.8 g/dL — ABNORMAL LOW (ref 3.5–5.0)
Anion gap: 7 (ref 5–15)
BUN: 7 mg/dL — ABNORMAL LOW (ref 8–23)
CO2: 21 mmol/L — ABNORMAL LOW (ref 22–32)
Calcium: 8.6 mg/dL — ABNORMAL LOW (ref 8.9–10.3)
Chloride: 112 mmol/L — ABNORMAL HIGH (ref 98–111)
Creatinine, Ser: 0.48 mg/dL (ref 0.44–1.00)
GFR, Estimated: >60 mL/min (ref >60)
Glucose, Bld: 74 mg/dL (ref 70–99)
Phosphorus: 3.3 mg/dL (ref 2.5–4.6)
Potassium: 3.6 mmol/L (ref 3.5–5.1)
Sodium: 140 mmol/L (ref 135–145)

## 2020-10-28 LAB — HEPARIN LEVEL (UNFRACTIONATED): Heparin Unfractionated: >2.2 IU/mL — ABNORMAL HIGH (ref 0.30–0.70)

## 2020-10-28 LAB — MAGNESIUM: Magnesium: 2.1 mg/dL (ref 1.7–2.4)

## 2020-10-28 LAB — HEMOGLOBIN AND HEMATOCRIT, BLOOD
HCT: 28.4 % — ABNORMAL LOW (ref 36.0–46.0)
Hemoglobin: 8.7 g/dL — ABNORMAL LOW (ref 12.0–15.0)

## 2020-10-28 LAB — APTT: aPTT: 30 seconds (ref 24–36)

## 2020-10-28 MED ORDER — POTASSIUM CHLORIDE CRYS ER 20 MEQ PO TBCR
40.0000 meq | EXTENDED_RELEASE_TABLET | Freq: Once | ORAL | Status: AC
  Administered 2020-10-28: 40 meq via ORAL
  Filled 2020-10-28: qty 2

## 2020-10-28 MED ORDER — HEPARIN (PORCINE) 25000 UT/250ML-% IV SOLN
850.0000 [IU]/h | INTRAVENOUS | Status: DC
  Administered 2020-10-28: 850 [IU]/h via INTRAVENOUS
  Filled 2020-10-28: qty 250

## 2020-10-28 NOTE — Progress Notes (Addendum)
Patient is taking minimal PO intake this AM.  0900:150mL of Juvene and 2 bites of yogert   1400 Patient has drank 72mL of the first boost will try to get her to drink the rest.

## 2020-10-28 NOTE — Progress Notes (Signed)
PROGRESS NOTE  [name]:[id number] DOB: 01-17-1941   PCP: [name], MD  Patient is from: Home.  Bedbound and dependent for all ADLs at baseline.  DOA: 10/19/2020 LOS: 9  Chief complaints: Wound infection  Brief Narrative / Interim history: 80 year old F with PMH of dementia, recent vertebral osteomyelitis (completed antibiotics on 09/08/2020), multiple decubitus ulcer, DVT/PE on Eliquis, OSA, HTN and HLD brought to ED due to failure to thrive and admitted for hypernatremia, infected decubitus ulcer and failure to thrive.  Reportedly failed outpatient treatment with p.o. antibiotics.  She was treated with IV antibiotics and hypotonic IV fluid.   Hospital course complicated by acute hypoxemic respiratory failure thought to be due to aspiration in the setting of acute metabolic encephalopathy requiring transfer to ICU/stepdown for close monitoring.  Eventually weaned off oxygen.  SLP recommended full liquid diet.  CODE STATUS changed to DNR/DNI per discussion between previous attending and family. Main issue is poor p.o. intake.  She is currently on dysphagia 3 diet but p.o. intake remains poor.  IR consulted for PEG tube.   Subjective: Seen and examined earlier this morning.  No major events overnight of this morning.   Poor appetite.  P.o. intake remains poor.  Otherwise, no complaints.  Denies pain, shortness of breath or GI symptoms.  Objective: Vitals:   10/27/20 1222 10/27/20 1950 10/27/20 2102 10/28/20 0425  BP: 130/67  130/73 (!) 126/55  Pulse: (!) 54  84 90  Resp: 16  15 18   Temp: 97.8 F (36.6 C)  98.6 F (37 C) 97.8 F (36.6 C)  TempSrc: Oral  Oral Oral  SpO2: 100% 94% 93% 96%  Weight:      Height:        Intake/Output Summary (Last 24 hours) at 10/28/2020 1123 Last data filed at 10/28/2020 0900 Gross per 24 hour  Intake 890 ml  Output 400 ml  Net 490 ml   Filed Weights   10/19/20 2107  Weight: 61.3 kg    Examination:  GENERAL: Frail looking  elderly female.  No distress.  Nontoxic. HEENT: MMM.  Vision and hearing grossly intact.  NECK: Supple.  No apparent JVD.  RESP: 96 % on RA.  No IWOB.  Fair aeration bilaterally. CVS:  RRR. Heart sounds normal.  ABD/GI/GU: BS+. Abd soft, NTND.  MSK/EXT:  Moves extremities. No apparent deformity. No edema.  SKIN: Pressure skin injuries over upper lumbar area, sacral and left buttock.  See picture in media for more. NEURO: Sleepy but wakes to voice easily.  Oriented fairly.  No apparent focal neuro deficit. PSYCH: Calm. Normal affect.    Procedures:  None  Microbiology summarized: 3/10-COVID-19 PCR nonreactive 3/10-blood culture with Staphylococcus auricularis in aerobic bottles from single set 3/15-repeat blood cultures NGTD  Assessment & Plan: Infected multiple decubitus ulcer, POA -CT of the abdomen pelvis does not show any acute osteomyelitis at this time. 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 -Initial blood culture with staph auricularis in aerobic bottle likely contaminant.   -Repeat blood cultures NGTD. -Vanco and cefepime 3/10-3/16>> p.o. Amoxil 3/16>> for 4 weeks. -Outpatient follow-up with ID in 4 weeks. -Will consult IR for PEG tube placement to optimize nutrition as p.o. intake remains poor.  Acute hypoxic respiratory failure 2/2 aspiration event, resolving -CTA chest on 3/12 with complete collapse of RLL with large debris's in LLL  bronchus  intermedius -Antibiotics as above. -Upgraded to dysphagia-3 diet but p.o. intake remains poor. -Continue aspiration precautions  Acute metabolic encephalopathy: Seems to have resolved. Chronic dementia without behavioral disturbance -Oriented to self, place and person -Discontinued Aricept in the setting of bradycardia -Continue  Namenda -Reorientation and delirium precautions  Sinus bradycardia: Resolved after stopping Aricept. -Continue holding Aricept -Replenish electrolytes  Hypovolemic hypernatremia: Likely due to poor p.o. intake.  Resolved. -Discontinue IV fluid -Continue monitoring -Encourage p.o. intake  Hypokalemia/hypophosphatemia/hypomagnesemia:  Refeeding syndrome?  Resolved. -K-Dur 40 mEq x 1  Essential hypertension: Normotensive. 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 -Hold Eliquis for PEG tube placement -Start IV heparin per pharmacy.  Normocytic anemia: H&H relatively stable. Recent Labs    10/19/20 1611 10/19/20 1711 10/21/20 0553 10/23/20 0912 10/24/20 0302 10/25/20 0458 10/25/20 1626 10/26/20 0606 10/27/20 0303 10/28/20 0331  HGB 11.7* 12.2 10.6* 12.3 9.5* 8.2* 8.7* 9.0* 9.2* 8.7*  -Monitor intermittently  Thrombocytopenia: Resolved. Recent Labs  Lab 10/23/20 0912 10/24/20 0302 10/25/20 0458 10/25/20 1626 10/26/20 0606  PLT 116* 102* 140* 150 159   Recurrent hypoglycemia: Likely due to poor p.o. intake.  Not on insulin.  BMP glucose in 70s. -Encourage p.o. intake  -Appreciate help by dietitian.  -IR consult for PEG tube placement  Goal of care: DNR/DNI per discussion between previous provider and patient's family.  Family wants PEG tube placed to optimize nutrition and wound healing. -Needs outpatient palliative care follow-up  Failure to thrive in the setting of poor oral intake -Upgraded to dysphagia-3 diet but caloric intake remains poor -IR consulted for PEG tube placement. Body mass index is 24.72 kg/m. Nutrition Problem: Increased nutrient needs Etiology: acute illness,wound healing Signs/Symptoms: estimated needs Interventions: Hormel Shake,Boost Breeze,Prostat,Magic cup,MVI,Juven   Pressure skin injury: POA Pressure Injury 10/19/20 Vertebral column Left;Mid Unstageable - Full thickness tissue  loss in which the base of the injury is covered by slough (yellow, tan, gray, green or brown) and/or eschar (tan, brown or black) in the wound bed. eschar d/t removal  (Active)  10/19/20 2020  Location: Vertebral column  Location Orientation: Left;Mid  Staging: Unstageable - Full thickness tissue loss in which the base of the injury is covered by slough (yellow, tan, gray, green or brown) and/or eschar (tan, brown or black) in the wound bed.  Wound Description (Comments): eschar d/t removal of medical equipment which became infected (6x5)  Present on Admission: Yes     Pressure Injury 10/19/20 Buttocks Left Stage 4 - Full thickness tissue loss with exposed bone, tendon or muscle. 7x2x3, tunneling at 12'oclock-6cm, 9'oclock-1.5cm. (Active)  10/19/20 2020  Location: Buttocks  Location Orientation: Left  Staging: Stage 4 - Full thickness tissue loss with exposed bone, tendon or muscle.  Wound Description (Comments): 7x2x3, tunneling at 12'oclock-6cm, 9'oclock-1.5cm.  Present on Admission: Yes     Pressure Injury 10/19/20 Buttocks Mid Stage 3 -  Full thickness tissue loss. Subcutaneous fat may be visible but bone, tendon or muscle are NOT exposed. intragluteal cluster, 8.5x5 (Active)  10/19/20 2020  Location: Buttocks  Location Orientation: Mid  Staging: Stage 3 -  Full thickness tissue loss. Subcutaneous fat may be visible but bone, tendon or muscle are NOT exposed.  Wound Description (Comments): intragluteal cluster, 8.5x5  Present on Admission: Yes   DVT prophylaxis:  On Eliquis for VTE.  Changing to IV heparin pending PEG tube placement  Code Status: DNR/DNI Family Communication: Updated patient's son over the phone. Level of  care: Telemetry Status is: Inpatient  Remains inpatient appropriate because:Ongoing diagnostic testing needed not appropriate for outpatient work up, Inpatient level of care appropriate due to severity of illness and PEG tube placement.   Dispo: The patient is  from: Home              Anticipated d/c is to: Home              Patient currently is not medically stable to d/c.   Difficult to place patient No       Consultants:  Infectious disease Interventional radiology   Sch Meds:  Scheduled Meds: . (feeding supplement) PROSource Plus  30 mL Oral BID BM  . amoxicillin  500 mg Oral Q8H  . vitamin C  1,000 mg Oral Daily  . busPIRone  15 mg Oral BID  . Chlorhexidine Gluconate Cloth  6 each Topical Daily  . collagenase   Topical Daily  . dronabinol  2.5 mg Oral QHS  . feeding supplement  1 Container Oral BID BM  . ferrous sulfate  325 mg Oral Q breakfast  . linaclotide  145 mcg Oral Daily  . mouth rinse  15 mL Mouth Rinse BID  . memantine  28 mg Oral QHS  . multivitamin with minerals  1 tablet Oral Daily  . nutrition supplement (JUVEN)  1 packet Oral BID BM  . sodium chloride flush  10-40 mL Intracatheter Q12H   Continuous Infusions: . sodium chloride 10 mL/hr at 10/25/20 0451   PRN Meds:.sodium chloride, acetaminophen, nitroGLYCERIN, oxyCODONE, sodium chloride flush  Antimicrobials: Anti-infectives (From admission, onward)   Start     Dose/Rate Route Frequency Ordered Stop   10/26/20 0930  amoxicillin (AMOXIL) capsule 500 mg        500 mg Oral Every 8 hours 10/26/20 0833 11/23/20 0559   10/21/20 2200  vancomycin (VANCOREADY) IVPB 750 mg/150 mL  Status:  Discontinued        750 mg 150 mL/hr over 60 Minutes Intravenous Every 24 hours 10/21/20 0739 10/26/20 0833   10/20/20 2200  vancomycin (VANCOREADY) IVPB 1250 mg/250 mL  Status:  Discontinued        1,250 mg 166.7 mL/hr over 90 Minutes Intravenous Every 24 hours 10/19/20 2253 10/21/20 0739   10/19/20 2315  ceFEPIme (MAXIPIME) 2 g in sodium chloride 0.9 % 100 mL IVPB  Status:  Discontinued        2 g 200 mL/hr over 30 Minutes Intravenous Every 12 hours 10/19/20 2226 10/26/20 0833   10/19/20 2300  vancomycin (VANCOREADY) IVPB 1000 mg/200 mL        1,000 mg 200 mL/hr over 60  Minutes Intravenous  Once 10/19/20 2209 10/20/20 0135   10/19/20 2300  ceFEPIme (MAXIPIME) 2 g in sodium chloride 0.9 % 100 mL IVPB  Status:  Discontinued        2 g 200 mL/hr over 30 Minutes Intravenous  Once 10/19/20 2209 10/19/20 2226   10/19/20 1615  ceFAZolin (ANCEF) IVPB 1 g/50 mL premix        1 g 100 mL/hr over 30 Minutes Intravenous  Once 10/19/20 1614 10/19/20 1931       I have personally reviewed the following labs and images: CBC: Recent Labs  Lab 10/23/20 0912 10/24/20 0302 10/25/20 0458 10/25/20 1626 10/26/20 0606 10/27/20 0303 10/28/20 0331  WBC 8.6 7.5 7.9 7.9 9.2  --   --   HGB 12.3 9.5* 8.2* 8.7* 9.0* 9.2* 8.7*  HCT 41.5 30.7* 27.1* 28.7*  29.6* 29.7* 28.4*  MCV 104.8* 100.7* 103.0* 102.5* 102.1*  --   --   PLT 116* 102* 140* 150 159  --   --    BMP &GFR Recent Labs  Lab 10/22/20 0326 10/23/20 0912 10/25/20 0458 10/25/20 1626 10/26/20 0606 10/27/20 0303 10/28/20 0331  NA 147*   < > 146* 142 139 141 140  K 3.7   < > 2.9* 2.7* 4.0 3.0* 3.6  CL 112*   < > 117* 113* 113* 111 112*  CO2 25   < > 19* 21* 20* 22 21*  GLUCOSE 88   < > 73 137* 122* 77 74  BUN 12   < > 11 10 9 8  7*  CREATININE 0.48   < > 0.41* 0.49 0.39* 0.37* 0.48  CALCIUM 8.8*   < > 8.8* 8.6* 8.5* 8.8* 8.6*  MG 2.2  --  1.6*  --  1.8 1.8 2.1  PHOS  --   --   --  1.5* 2.7 2.1* 3.3   < > = values in this interval not displayed.   Estimated Creatinine Clearance: 48.3 mL/min (by C-G formula based on SCr of 0.48 mg/dL). Liver & Pancreas: Recent Labs  Lab 10/23/20 0912 10/24/20 0302 10/25/20 0458 10/25/20 1626 10/26/20 0606 10/27/20 0303 10/28/20 0331  AST 20 20 17   --   --   --   --   ALT 15 15 15   --   --   --   --   ALKPHOS 133* 123 109  --   --   --   --   BILITOT 1.1 1.1 0.7  --   --   --   --   PROT 5.8* 5.4* 5.0*  --   --   --   --   ALBUMIN 2.1* 1.9* 1.8* 1.8* 1.8* 1.8* 1.8*   No results for input(s): LIPASE, AMYLASE in the last 168 hours. No results for input(s): AMMONIA  in the last 168 hours. Diabetic: No results for input(s): HGBA1C in the last 72 hours. Recent Labs  Lab 10/21/20 1608 10/23/20 2349 10/23/20 2350 10/24/20 0105 10/24/20 0106  GLUCAP 118* 52* 63* 88 84   Cardiac Enzymes: No results for input(s): CKTOTAL, CKMB, CKMBINDEX, TROPONINI in the last 168 hours. No results for input(s): PROBNP in the last 8760 hours. Coagulation Profile: No results for input(s): INR, PROTIME in the last 168 hours. Thyroid Function Tests: No results for input(s): TSH, T4TOTAL, FREET4, T3FREE, THYROIDAB in the last 72 hours. Lipid Profile: No results for input(s): CHOL, HDL, LDLCALC, TRIG, CHOLHDL, LDLDIRECT in the last 72 hours. Anemia Panel: No results for input(s): VITAMINB12, FOLATE, FERRITIN, TIBC, IRON, RETICCTPCT in the last 72 hours. Urine analysis:    Component Value Date/Time   COLORURINE STRAW (A) 10/19/2020 1942   APPEARANCEUR CLEAR 10/19/2020 1942   LABSPEC 1.021 10/19/2020 1942   PHURINE 9.0 (H) 10/19/2020 1942   GLUCOSEU NEGATIVE 10/19/2020 1942   HGBUR NEGATIVE 10/19/2020 1942   BILIRUBINUR NEGATIVE 10/19/2020 1942   KETONESUR NEGATIVE 10/19/2020 1942   PROTEINUR NEGATIVE 10/19/2020 1942   UROBILINOGEN 1.0 02/14/2012 1054   NITRITE NEGATIVE 10/19/2020 1942   LEUKOCYTESUR NEGATIVE 10/19/2020 1942   Sepsis Labs: Invalid input(s): PROCALCITONIN, LACTICIDVEN  Microbiology: Recent Results (from the past 240 hour(s))  Blood culture (routine x 2)     Status: None   Collection Time: 10/19/20  5:02 PM   Specimen: BLOOD LEFT FOREARM  Result Value Ref Range Status   Specimen Description  Final    BLOOD LEFT FOREARM Performed at [facility], [address], [place], [place] [address]    Special Requests   Final    BOTTLES DRAWN AEROBIC AND ANAEROBIC Blood Culture results may not be optimal due to an excessive volume of blood received in culture bottles Performed at [facility], [address] [place], [place] [address]    Culture   Final    NO GROWTH 5 DAYS Performed at [facility], [address], [place], [place] [address]    Report Status 10/24/2020 FINAL  Final  SARS CORONAVIRUS 2 (TAT 6-24 HRS) Nasopharyngeal Nasopharyngeal Swab     Status: None   Collection Time: 10/19/20  5:14 PM   Specimen: Nasopharyngeal Swab  Result Value Ref Range Status   SARS Coronavirus 2 NEGATIVE NEGATIVE Final    Comment: (NOTE) SARS-CoV-2 target nucleic acids are NOT DETECTED.  The SARS-CoV-2 RNA is generally detectable in upper and lower respiratory specimens during the acute phase of infection. Negative results do not preclude SARS-CoV-2 infection, do not rule out co-infections with other pathogens, and should not be used as the sole basis for treatment or other patient management decisions. Negative results must be combined with clinical observations, patient history, and epidemiological information. The expected result is Negative.  Fact Sheet for Patients: HairSlick.no  Fact Sheet for Healthcare Providers: quierodirigir.com  This test is not yet approved or cleared by the [place] FDA and  has been authorized for detection and/or diagnosis of SARS-CoV-2 by FDA under an Emergency Use Authorization (EUA). This EUA will remain  in effect (meaning this test can be used) for the duration of the COVID-19 declaration under Se ction 564(b)(1) of the Act, 21 U.S.C. section 360bbb-3(b)(1), unless the authorization is terminated or revoked sooner.  Performed at [facility], [address], [place], [place] [address]   Blood culture (routine x 2)     Status: Abnormal   Collection Time: 10/19/20  9:42 PM   Specimen: BLOOD  Result Value Ref Range Status   Specimen Description   Final    BLOOD BLOOD RIGHT HAND Performed at [facility], [address], [place], [place] [address]    Special  Requests   Final    BOTTLES DRAWN AEROBIC ONLY Blood Culture adequate volume Performed at [facility], [address], [place], [place] [address]    Culture  Setup Time   Final    GRAM POSITIVE COCCI IN CLUSTERS AEROBIC BOTTLE ONLY CRITICAL RESULT CALLED TO, READ BACK BY AND VERIFIED WITH: PHARMD ANH PHAM BY MESSAN H. AT 9179 ON 10/21/2020    Culture (A)  Final    STAPHYLOCOCCUS AURICULARIS THE SIGNIFICANCE OF ISOLATING THIS ORGANISM FROM A SINGLE SET OF BLOOD CULTURES WHEN MULTIPLE SETS ARE DRAWN IS UNCERTAIN. PLEASE NOTIFY THE MICROBIOLOGY DEPARTMENT WITHIN ONE WEEK IF SPECIATION AND SENSITIVITIES ARE REQUIRED. Performed at [facility], [address], [place], [place] [address]    Report Status 10/23/2020 FINAL  Final  Blood Culture ID Panel (Reflexed)     Status: Abnormal   Collection Time: 10/19/20  9:42 PM  Result Value Ref Range Status   Enterococcus faecalis NOT DETECTED NOT DETECTED Final   Enterococcus Faecium NOT DETECTED NOT DETECTED Final   Listeria monocytogenes NOT DETECTED NOT DETECTED Final   Staphylococcus species DETECTED (A) NOT DETECTED Final    Comment: CRITICAL RESULT CALLED TO, READ BACK BY AND  VERIFIED WITH: PHARMD ANH PHAM BY MESSAN H. AT 1478 ON 10/21/2020    Staphylococcus aureus (BCID) NOT DETECTED NOT DETECTED Final   Staphylococcus epidermidis NOT DETECTED NOT DETECTED Final   Staphylococcus lugdunensis NOT DETECTED NOT DETECTED Final   Streptococcus species NOT DETECTED NOT DETECTED Final   Streptococcus agalactiae NOT DETECTED NOT DETECTED Final   Streptococcus pneumoniae NOT DETECTED NOT DETECTED Final   Streptococcus pyogenes NOT DETECTED NOT DETECTED Final   A.calcoaceticus-baumannii NOT DETECTED NOT DETECTED Final   Bacteroides fragilis NOT DETECTED NOT DETECTED Final   Enterobacterales NOT DETECTED NOT DETECTED Final   Enterobacter cloacae complex NOT DETECTED NOT DETECTED Final   Escherichia coli NOT DETECTED NOT  DETECTED Final   Klebsiella aerogenes NOT DETECTED NOT DETECTED Final   Klebsiella oxytoca NOT DETECTED NOT DETECTED Final   Klebsiella pneumoniae NOT DETECTED NOT DETECTED Final   Proteus species NOT DETECTED NOT DETECTED Final   Salmonella species NOT DETECTED NOT DETECTED Final   Serratia marcescens NOT DETECTED NOT DETECTED Final   Haemophilus influenzae NOT DETECTED NOT DETECTED Final   Neisseria meningitidis NOT DETECTED NOT DETECTED Final   Pseudomonas aeruginosa NOT DETECTED NOT DETECTED Final   Stenotrophomonas maltophilia NOT DETECTED NOT DETECTED Final   Candida albicans NOT DETECTED NOT DETECTED Final   Candida auris NOT DETECTED NOT DETECTED Final   Candida glabrata NOT DETECTED NOT DETECTED Final   Candida krusei NOT DETECTED NOT DETECTED Final   Candida parapsilosis NOT DETECTED NOT DETECTED Final   Candida tropicalis NOT DETECTED NOT DETECTED Final   Cryptococcus neoformans/gattii NOT DETECTED NOT DETECTED Final    Comment: Performed at [facility], [address] [place], [place] [address]  MRSA PCR Screening     Status: Abnormal   Collection Time: 10/21/20  4:15 PM   Specimen: Nasopharyngeal  Result Value Ref Range Status   MRSA by PCR POSITIVE (A) NEGATIVE Final    Comment:        The GeneXpert MRSA Assay (FDA approved for NASAL specimens only), is one component of a comprehensive MRSA colonization surveillance program. It is not intended to diagnose MRSA infection nor to guide or monitor treatment for MRSA infections. RESULT CALLED TO, READ BACK BY AND VERIFIED WITH: MIKAYLA HAILEY RN 10/21/20 @1902  BY P.HENDERSON Performed at [facility], [address] [place], [place] [address]   Culture, blood (routine x 2)     Status: None (Preliminary result)   Collection Time: 10/24/20  3:02 AM   Specimen: BLOOD  Result Value Ref Range Status   Specimen Description   Final    BLOOD BLOOD LEFT HAND Performed at [facility], [address], [place], [place] [address]    Special Requests   Final    BOTTLES DRAWN AEROBIC ONLY Blood Culture adequate volume Performed at [facility], [address], [place], [place] [address]    Culture   Final    NO GROWTH 3 DAYS Performed at [facility], [address], [place], [place] [address]    Report Status PENDING  Incomplete  Culture, blood (routine x 2)     Status: None (Preliminary result)   Collection Time: 10/24/20  3:02 AM   Specimen: BLOOD  Result Value Ref Range Status   Specimen Description   Final    BLOOD BLOOD RIGHT HAND Performed at [facility], [address], [place], [place] [address]    Special Requests  Final    BOTTLES DRAWN AEROBIC ONLY Blood Culture adequate volume Performed at [facility], [address] [place], [place] [address]    Culture   Final    NO GROWTH 3 DAYS Performed at [facility], [address] [place], [place] [address]    Report Status PENDING  Incomplete    Radiology Studies: No results found.    Taye T. Gonfa [facility]  If 7PM-7AM, please contact night-coverage [email or web address] 10/28/2020, 11:23 AM

## 2020-10-28 NOTE — Progress Notes (Signed)
ANTICOAGULATION CONSULT NOTE - Initial Consult  Pharmacy Consult for heparin  Indication: bridge therapy while Eliquis for hx DVT/PE on hold for PEG placement  Allergies  Allergen Reactions  . T-Pa [Alteplase] Swelling    Does okay with it if premedicated with benadryl.  . Codeine Itching    Patient Measurements: Height: 5\' 2"  (157.5 cm) Weight: 61.3 kg (135 lb 2.3 oz) IBW/kg (Calculated) : 50.1 Heparin Dosing Weight:   Vital Signs: Temp: 97.8 F (36.6 C) (03/19 0425) Temp Source: Oral (03/19 0425) BP: 126/55 (03/19 0425) Pulse Rate: 90 (03/19 0425)  Labs: Recent Labs    10/25/20 1626 10/26/20 0606 10/27/20 0303 10/28/20 0331  HGB 8.7* 9.0* 9.2* 8.7*  HCT 28.7* 29.6* 29.7* 28.4*  PLT 150 159  --   --   CREATININE 0.49 0.39* 0.37* 0.48    Estimated Creatinine Clearance: 48.3 mL/min (by C-G formula based on SCr of 0.48 mg/dL).   Medical History: Past Medical History:  Diagnosis Date  . Arthritis   . Asthma   . Chronic back pain   . Depression   . History of pulmonary embolus (PE)   . Hx-TIA (transient ischemic attack)   . Hyperlipemia   . Hypertension   . Personal history of DVT (deep vein thrombosis)   . Scoliosis   . Sleep apnea    cpap - settingsi at 3   . Stroke (HCC)   . 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 amLODipine (NORVASC) 10 MG tablet Take 10 mg by mouth daily.   Past Week at Unknown time  . Ascorbic Acid (VITAMIN C) 1000 MG tablet Take 1,000 mg by mouth daily.   10/19/2020 at Unknown time  . atorvastatin (LIPITOR) 20 MG tablet Take 20 mg by mouth every evening.   10/18/2020 at Unknown time  . busPIRone (BUSPAR) 30 MG tablet Take 15 mg by mouth 2 (two) times daily.   10/19/2020 at Unknown time  . cholecalciferol (VITAMIN D3) 25 MCG (1000 UNIT) tablet Take 1,000 Units by mouth daily.   10/19/2020 at Unknown time  . donepezil (ARICEPT) 10 MG tablet Take 1  tablet (10 mg total) by mouth at bedtime. 30 tablet 0 10/18/2020 at Unknown time  . dronabinol (MARINOL) 2.5 MG capsule Take 2.5 mg by mouth at bedtime.   10/18/2020 at Unknown time  . ELIQUIS 5 MG TABS tablet Take 5 mg by mouth 2 (two) times daily.   10/19/2020 at 0900  . feeding supplement (ENSURE ENLIVE / ENSURE PLUS) LIQD Take 237 mLs by mouth 3 (three) times daily between meals. 237 mL 12 10/19/2020 at Unknown time  . ferrous sulfate 325 (65 FE) MG tablet Take 1 tablet (325 mg total) by mouth daily with breakfast.  3 10/19/2020 at Unknown time  . furosemide (LASIX) 40 MG tablet Take 40 mg by mouth daily.   10/19/2020 at Unknown time  . LINZESS 145 MCG CAPS capsule Take 145 mcg by mouth daily.   10/19/2020 at Unknown time  . lisinopril (ZESTRIL) 5 MG tablet Take 5 mg by mouth daily.   Past Week at Unknown time  . memantine (NAMENDA XR) 28 MG CP24 24 hr capsule Take 1 capsule (28 mg total) by mouth daily. (Patient taking differently: Take 28 mg by mouth at bedtime.) 30 capsule 0 10/18/2020 at Unknown time  . Multiple Vitamins-Minerals (MULTI-VITAMIN GUMMIES PO) Take 1 each by mouth daily.    10/18/2020  at Unknown time  . oxyCODONE (ROXICODONE) 15 MG immediate release tablet Take 7.5-15 mg by mouth 4 (four) times daily as needed for pain.   10/19/2020 at Unknown time  . potassium chloride (KLOR-CON) 10 MEQ tablet Take 10 mEq by mouth daily.   10/19/2020 at Unknown time  . acetaminophen (TYLENOL) 325 MG tablet Take 2 tablets (650 mg total) by mouth every 4 (four) hours as needed for mild pain or moderate pain (or Fever >/= 101).   unknown  . cycloSPORINE (RESTASIS) 0.05 % ophthalmic emulsion Place 1 drop into both eyes daily as needed (dry eyes). 0.4 mL  unknown  . nitroGLYCERIN (NITROSTAT) 0.4 MG SL tablet Place 1 tablet (0.4 mg total) under the tongue every 5 (five) minutes as needed for chest pain. 25 tablet 2 unknown  . polyethylene glycol (MIRALAX / GLYCOLAX) 17 g packet Take 17 g by mouth 2 (two) times daily.  (Patient not taking: Reported on 10/19/2020) 14 each 0 Completed Course at Unknown time  . prochlorperazine (COMPAZINE) 10 MG tablet Take 10 mg by mouth every 6 (six) hours as needed for nausea or vomiting.   unknown    Assessment: 80 yo F on apixaban 5 mg po BID for hx PE/DVT.  Pharmacy consulted to dose heparin for bridge therapy while apixaban on hold for IR to place PEG tube.  Apixaban 5 mg po BID> last dose 3/19 0909 AM, Hg 8.7, Pltc down to 102 on 3/15, up to 159 on 3/17.  Goal of Therapy:  Heparin level 0.3-0.7 units/ml aPTT 66-102 seconds Monitor platelets by anticoagulation protocol: Yes   Plan:  Get baseline aPTT/heparin level now> heparin level will be elevated due to apixaban Tonight start heparin drip with no bolus @ rate 850 units/hr at 2200 tonight and check 8 hr aPTT/HL Daily CBC, aPTT, heparin level F/u timing of PEG placement  Herby Abraham, Pharm.D 10/28/2020 11:33 AM

## 2020-10-29 ENCOUNTER — Encounter (HOSPITAL_COMMUNITY): Payer: Self-pay | Admitting: Internal Medicine

## 2020-10-29 DIAGNOSIS — E870 Hyperosmolality and hypernatremia: Secondary | ICD-10-CM | POA: Diagnosis not present

## 2020-10-29 DIAGNOSIS — L8994 Pressure ulcer of unspecified site, stage 4: Secondary | ICD-10-CM | POA: Diagnosis not present

## 2020-10-29 DIAGNOSIS — I5181 Takotsubo syndrome: Secondary | ICD-10-CM | POA: Diagnosis not present

## 2020-10-29 DIAGNOSIS — L8995 Pressure ulcer of unspecified site, unstageable: Secondary | ICD-10-CM | POA: Diagnosis not present

## 2020-10-29 LAB — CBC
HCT: 25.7 % — ABNORMAL LOW (ref 36.0–46.0)
Hemoglobin: 7.9 g/dL — ABNORMAL LOW (ref 12.0–15.0)
MCH: 32.1 pg (ref 26.0–34.0)
MCHC: 30.7 g/dL (ref 30.0–36.0)
MCV: 104.5 fL — ABNORMAL HIGH (ref 80.0–100.0)
Platelets: 163 10*3/uL (ref 150–400)
RBC: 2.46 MIL/uL — ABNORMAL LOW (ref 3.87–5.11)
RDW: 18.5 % — ABNORMAL HIGH (ref 11.5–15.5)
WBC: 8.6 10*3/uL (ref 4.0–10.5)
nRBC: 0 % (ref 0.0–0.2)

## 2020-10-29 LAB — TYPE AND SCREEN
ABO/RH(D): AB POS
Antibody Screen: NEGATIVE

## 2020-10-29 LAB — RENAL FUNCTION PANEL
Albumin: 1.6 g/dL — ABNORMAL LOW (ref 3.5–5.0)
Anion gap: 6 (ref 5–15)
BUN: 18 mg/dL (ref 8–23)
CO2: 22 mmol/L (ref 22–32)
Calcium: 8.6 mg/dL — ABNORMAL LOW (ref 8.9–10.3)
Chloride: 114 mmol/L — ABNORMAL HIGH (ref 98–111)
Creatinine, Ser: 0.42 mg/dL — ABNORMAL LOW (ref 0.44–1.00)
GFR, Estimated: >60 mL/min (ref >60)
Glucose, Bld: 81 mg/dL (ref 70–99)
Phosphorus: 2.6 mg/dL (ref 2.5–4.6)
Potassium: 3.9 mmol/L (ref 3.5–5.1)
Sodium: 142 mmol/L (ref 135–145)

## 2020-10-29 LAB — FOLATE: Folate: 7.6 ng/mL (ref >5.9)

## 2020-10-29 LAB — IRON AND TIBC
Iron: 52 ug/dL (ref 28–170)
Saturation Ratios: 56 % — ABNORMAL HIGH (ref 10.4–31.8)
TIBC: 92 ug/dL — ABNORMAL LOW (ref 250–450)
UIBC: 40 ug/dL

## 2020-10-29 LAB — RETICULOCYTES
Immature Retic Fract: 21.7 % — ABNORMAL HIGH (ref 2.3–15.9)
RBC.: 2.41 MIL/uL — ABNORMAL LOW (ref 3.87–5.11)
Retic Count, Absolute: 76.9 10*3/uL (ref 19.0–186.0)
Retic Ct Pct: 3.2 % — ABNORMAL HIGH (ref 0.4–3.1)

## 2020-10-29 LAB — APTT
aPTT: 86 seconds — ABNORMAL HIGH (ref 24–36)
aPTT: 111 seconds — ABNORMAL HIGH (ref 24–36)

## 2020-10-29 LAB — HEPARIN LEVEL (UNFRACTIONATED): Heparin Unfractionated: >2.2 IU/mL — ABNORMAL HIGH (ref 0.30–0.70)

## 2020-10-29 LAB — VITAMIN B12: Vitamin B-12: 671 pg/mL (ref 180–914)

## 2020-10-29 LAB — MAGNESIUM: Magnesium: 2 mg/dL (ref 1.7–2.4)

## 2020-10-29 LAB — FERRITIN: Ferritin: 153 ng/mL (ref 11–307)

## 2020-10-29 MED ORDER — CHLORHEXIDINE GLUCONATE CLOTH 2 % EX PADS
6.0000 | MEDICATED_PAD | Freq: Once | CUTANEOUS | Status: AC
  Administered 2020-10-30: 6 via TOPICAL

## 2020-10-29 MED ORDER — CEFAZOLIN SODIUM-DEXTROSE 2-4 GM/100ML-% IV SOLN: 2.0000 g | INTRAVENOUS | Status: AC

## 2020-10-29 MED ORDER — HEPARIN (PORCINE) 25000 UT/250ML-% IV SOLN
650.0000 [IU]/h | INTRAVENOUS | Status: DC
  Administered 2020-10-29: 750 [IU]/h via INTRAVENOUS
  Administered 2020-10-30: 650 [IU]/h via INTRAVENOUS
  Filled 2020-10-29 (×2): qty 250

## 2020-10-29 NOTE — H&P (Signed)
Chief Complaint: Poor oral intake  Referring Physician(s): Candelaria Stagers  Supervising Physician: Oley Balm  Patient Status: [facility] - In-pt  History of Present Illness: [name] is a 80 y.o. female with medical issues including dementia, recent vertebral osteomyelitis (completed antibiotics on 09/08/2020), multiple decubitus ulcers, DVT/PE on Eliquis (held, now on heparin drip), HTN and HLD.  She was brought to ED due to failure to thrive and admitted for hypernatremia, infected decubitus ulcer.    She was treated with IV antibiotics and hypotonic IV fluid.   Hospital course was complicated by acute hypoxemic respiratory failure thought to be due to aspiration in the setting of acute metabolic encephalopathy requiring transfer to ICU/stepdown for close monitoring.   Her son is at the bedside and explains that her main issue is poor oral intake.    He feels if she could consume more calories/protein, her decubitus ulcers would heal.  We are asked to place a gastrostomy tube.  Past Medical History:  Diagnosis Date  . Arthritis   . Asthma   . Chronic back pain   . Depression   . History of pulmonary embolus (PE)   . Hx-TIA (transient ischemic attack)   . Hyperlipemia   . Hypertension   . Personal history of DVT (deep vein thrombosis)   . Scoliosis   . Sleep apnea    cpap - settingsi at 2   . Stroke (HCC)   . 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 ARTHROSCOPIC REPAIR ACL  02/03/2004  . HARDWARE REMOVAL N/A 07/27/2020   Procedure: Lumbar Hardware removal with lumbar wound debridement;  Surgeon: Barnett Abu, MD;  Location: [facility] OR;  Service: Neurosurgery;  Laterality: N/A;  . IR IVC FILTER PLMT / S&I Lenise Arena GUID/MOD SED  05/10/2020  . LEFT HEART CATH AND CORONARY ANGIOGRAPHY N/A 09/16/2019   Procedure: LEFT HEART CATH AND CORONARY ANGIOGRAPHY;  Surgeon: Yvonne Kendall, MD;  Location: [facility] INVASIVE CV LAB;   Service: Cardiovascular;  Laterality: N/A;  . LOOP RECORDER INSERTION N/A 02/21/2020   Procedure: LOOP RECORDER INSERTION;  Surgeon: Duke Salvia, MD;  Location: [facility] INVASIVE CV LAB;  Service: Cardiovascular;  Laterality: N/A;  . LUMBAR WOUND DEBRIDEMENT N/A 07/27/2020   Procedure: LUMBAR WOUND DEBRIDEMENT;  Surgeon: Barnett Abu, MD;  Location: [facility] OR;  Service: Neurosurgery;  Laterality: N/A;  . OOPHORECTOMY  1970  . SPINE SURGERY  03/31/2006  . TOTAL KNEE ARTHROPLASTY Left 05/17/2016   Procedure: LEFT TOTAL KNEE ARTHROPLASTY;  Surgeon: Kathryne Hitch, MD;  Location: [facility] ORS;  Service: Orthopedics;  Laterality: Left;  Adductor Block; Spinal to General  . TUBAL LIGATION  1983    Allergies: T-pa [alteplase] and Codeine  Medications: Prior to Admission medications   Medication Sig Start Date End Date Taking? Authorizing Provider  amLODipine (NORVASC) 10 MG tablet Take 10 mg by mouth daily. 09/01/20  Yes [provider]  Ascorbic Acid (VITAMIN C) 1000 MG tablet Take 1,000 mg by mouth daily.   Yes [provider]  atorvastatin (LIPITOR) 20 MG tablet Take 20 mg by mouth every evening. 09/15/20  Yes [provider]  busPIRone (BUSPAR) 30 MG tablet Take 15 mg by mouth 2 (two) times daily. 09/22/20  Yes [provider]  cholecalciferol (VITAMIN D3) 25 MCG (1000 UNIT) tablet Take 1,000 Units by mouth daily.   Yes [provider]  donepezil (ARICEPT) 10 MG tablet Take 1 tablet (10 mg total) by  mouth at bedtime. 03/09/20  Yes Angiulli, Mcarthur Rossetti, PA-C  dronabinol (MARINOL) 2.5 MG capsule Take 2.5 mg by mouth at bedtime.   Yes [provider]  ELIQUIS 5 MG TABS tablet Take 5 mg by mouth 2 (two) times daily. 09/26/20  Yes [provider]  feeding supplement (ENSURE ENLIVE / ENSURE PLUS) LIQD Take 237 mLs by mouth 3 (three) times daily between meals. 05/30/20  Yes Swayze, Ava, DO  ferrous sulfate 325 (65 FE) MG tablet Take 1 tablet (325 mg  total) by mouth daily with breakfast. 08/06/20  Yes Lewie Chamber, MD  furosemide (LASIX) 40 MG tablet Take 40 mg by mouth daily. 09/19/20  Yes [provider]  LINZESS 145 MCG CAPS capsule Take 145 mcg by mouth daily. 04/16/20  Yes [provider]  lisinopril (ZESTRIL) 5 MG tablet Take 5 mg by mouth daily.   Yes [provider]  memantine (NAMENDA XR) 28 MG CP24 24 hr capsule Take 1 capsule (28 mg total) by mouth daily. Patient taking differently: Take 28 mg by mouth at bedtime. 03/09/20  Yes Angiulli, Mcarthur Rossetti, PA-C  Multiple Vitamins-Minerals (MULTI-VITAMIN GUMMIES PO) Take 1 each by mouth daily.    Yes [provider]  oxyCODONE (ROXICODONE) 15 MG immediate release tablet Take 7.5-15 mg by mouth 4 (four) times daily as needed for pain. 09/25/20  Yes [provider]  potassium chloride (KLOR-CON) 10 MEQ tablet Take 10 mEq by mouth daily. 09/19/20  Yes [provider]  acetaminophen (TYLENOL) 325 MG tablet Take 2 tablets (650 mg total) by mouth every 4 (four) hours as needed for mild pain or moderate pain (or Fever >/= 101). 08/06/20   Lewie Chamber, MD  cycloSPORINE (RESTASIS) 0.05 % ophthalmic emulsion Place 1 drop into both eyes daily as needed (dry eyes). 02/21/20   Layne Benton, NP  nitroGLYCERIN (NITROSTAT) 0.4 MG SL tablet Place 1 tablet (0.4 mg total) under the tongue every 5 (five) minutes as needed for chest pain. 09/17/19   Duke, Roe Rutherford, PA  polyethylene glycol (MIRALAX / GLYCOLAX) 17 g packet Take 17 g by mouth 2 (two) times daily. Patient not taking: Reported on 10/19/2020 03/09/20   Angiulli, Mcarthur Rossetti, PA-C  prochlorperazine (COMPAZINE) 10 MG tablet Take 10 mg by mouth every 6 (six) hours as needed for nausea or vomiting. 09/14/20   [provider]     Family History  Problem Relation Age of Onset  . Other Father        kidney problems    Social History   Socioeconomic History  . Marital status: Widowed    Spouse  name: Chrissie Noa  . Number of children: 2  . Years of education: 10  . Highest education level: Not on file  Occupational History    Employer: RETIRED  Tobacco Use  . Smoking status: Former Smoker    Packs/day: 1.00    Years: 10.00    Pack years: 10.00    Types: Cigarettes    Quit date: 08/12/1973    Years since quitting: 47.2  . Smokeless tobacco: Never Used  Vaping Use  . Vaping Use: Never used  Substance and Sexual Activity  . Alcohol use: No  . Drug use: No  . Sexual activity: Not on file  Other Topics Concern  . Not on file  Social History Narrative   Patient lives at home with spouse.   Caffeine Use: occasionally   Social Determinants of Health   Financial Resource Strain: Not  on file  Food Insecurity: Not on file  Transportation Needs: Not on file  Physical Activity: Not on file  Stress: Not on file  Social Connections: Not on file     Review of Systems: A 12 point ROS discussed and pertinent positives are indicated in the HPI above.  All other systems are negative.  Review of Systems  Vital Signs: BP (!) 110/55 (BP Location: Left Arm)   Pulse 74   Temp 98.8 F (37.1 C) (Oral)   Resp 18   Ht 5\' 2"  (1.575 m)   Wt 61.3 kg   SpO2 96%   BMI 24.72 kg/m   Physical Exam Vitals reviewed.  HENT:     Head: Normocephalic and atraumatic.     Mouth/Throat:     Comments: She does NOT open her mouth wide and this could present a challenge with placing the tube. Eyes:     Extraocular Movements: Extraocular movements intact.  Cardiovascular:     Rate and Rhythm: Normal rate and regular rhythm.  Pulmonary:     Effort: Pulmonary effort is normal. No respiratory distress.     Breath sounds: Normal breath sounds.  Abdominal:     General: There is no distension.     Palpations: Abdomen is soft.     Tenderness: There is no abdominal tenderness.  Musculoskeletal:        General: Normal range of motion.  Skin:    General: Skin is warm and dry.  Neurological:      General: No focal deficit present.     Mental Status: She is alert.  Psychiatric:        Mood and Affect: Mood normal.        Behavior: Behavior normal.     Imaging: DG Chest 1 View  Result Date: 10/21/2020 CLINICAL DATA:  Respiratory distress. EXAM: CHEST  1 VIEW COMPARISON:  October 21, 2020 (10:04 a.m.) FINDINGS: The study is markedly limited secondary to patient rotation. The heart size and mediastinal contours are within normal limits. Moderate to marked severity calcification of the thoracic aorta is seen. Both lungs are clear. Degenerative changes are noted throughout the thoracic spine. IMPRESSION: Stable exam, without evidence of acute cardiopulmonary disease. Electronically Signed   By: October 23, 2020 M.D.   On: 10/21/2020 15:50   CT HEAD WO CONTRAST  Result Date: 10/21/2020 CLINICAL DATA:  Failure to thrive EXAM: CT HEAD WITHOUT CONTRAST TECHNIQUE: Contiguous axial images were obtained from the base of the skull through the vertex without intravenous contrast. COMPARISON:  None. FINDINGS: Brain: There is no mass, hemorrhage or extra-axial collection. The size and configuration of the ventricles and extra-axial CSF spaces are normal. There is hypoattenuation of the white matter, most commonly indicating chronic small vessel disease. Vascular: No abnormal hyperdensity of the major intracranial arteries or dural venous sinuses. No intracranial atherosclerosis. Skull: The visualized skull base, calvarium and extracranial soft tissues are normal. Sinuses/Orbits: No fluid levels or advanced mucosal thickening of the visualized paranasal sinuses. No mastoid or middle ear effusion. The orbits are normal. 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 Electronically Signed   By: 12/21/2020 M.D.   On: 10/21/2020 20:38   CT ANGIO CHEST PE W OR WO CONTRAST  Result Date: 10/21/2020 CLINICAL DATA:  Hypoxia. EXAM: CT ANGIOGRAPHY CHEST WITH CONTRAST TECHNIQUE:  Multidetector CT imaging of the chest was performed using the standard protocol during bolus administration of intravenous contrast. Multiplanar CT image reconstructions and MIPs were obtained to  evaluate the vascular anatomy. CONTRAST:  OMNIPAQUE IOHEXOL 350 MG/ML SOLN COMPARISON:  May 05, 2020 FINDINGS: Cardiovascular: There is marked severity calcification of the aortic arch and ascending thoracic aorta which measures 4.6 cm x 4.1 cm. Satisfactory opacification of the pulmonary arteries to the segmental level. No evidence of pulmonary embolism. Normal heart size. No pericardial effusion. Mediastinum/Nodes: No enlarged mediastinal, hilar, or axillary lymph nodes. Thyroid gland, trachea, and esophagus demonstrate no significant findings. Lungs/Pleura: Complete collapse of the right lower lobe is seen with a large amount of debris noted within the lower lobe branch of the bronchus intermedius. There is no evidence of a pleural effusion. A tiny (approximately 5 mm) pneumothorax is seen along the anteromedial aspect of the right apex (axial CT images 24 through 37, CT series number 7). A similar appearing 2 mm pneumothorax is seen along the lateral aspect of the right upper lobe (axial CT image 37, CT series number 7). Upper Abdomen: No acute abnormality. Musculoskeletal: Multilevel degenerative changes seen throughout the thoracic spine. Review of the MIP images confirms the above findings. IMPRESSION: 1. Complete collapse of the right lower lobe with a large amount of debris noted within the lower lobe branch of the bronchus intermedius. 2. Tiny, 5 mm and 2 mm right upper lobe pneumothoraces. 3. Dilated ascending thoracic aorta. 4. Aortic atherosclerosis. Aortic Atherosclerosis (ICD10-I70.0). Electronically Signed   By: Aram Candela M.D.   On: 10/21/2020 20:54   CT ABDOMEN PELVIS W CONTRAST  Result Date: 10/19/2020 CLINICAL DATA:  Abdominal abscess/infection. Drainage from back wounds. EXAM: CT  ABDOMEN AND PELVIS WITH CONTRAST TECHNIQUE: Multidetector CT imaging of the abdomen and pelvis was performed using the standard protocol following bolus administration of intravenous contrast. CONTRAST:  OMNIPAQUE IOHEXOL 300 MG/ML  SOLN COMPARISON:  07/21/2020 FINDINGS: Lower chest: Mild basilar scarring.  No active lower chest disease. Hepatobiliary: No liver parenchymal lesion. Chronic intra and extrahepatic biliary ductal dilatation which may be becoming more prominent over time. The common duct appears to be dilated all the way to the ampulla. No calcified gallstones are seen. The gallbladder is distended. Pancreas: No pancreatic parenchymal lesion is seen. Pancreatic duct is mildly dilated. Spleen: Negative Adrenals/Urinary Tract: No adrenal or renal lesion. Bladder is unremarkable. Stomach/Bowel: Moderate amount of fecal matter in the colon. No other bowel finding. Vascular/Lymphatic: Aortic atherosclerosis. No aneurysm. IVC is normal. No adenopathy. Reproductive: Uterine leiomyomas.  No significant pelvic mass. Other: No free fluid or air. 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 No evidence of significant soft tissue abscess collection. See results of lumbar CT. IMPRESSION: 1. 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 No evidence of significant soft tissue abscess collection. See results of lumbar CT. 2. Chronic intra and extrahepatic biliary ductal dilatation which may be becoming more prominent over time. The common duct appears to be dilated all the way to the ampulla. No calcified gallstones are seen. The gallbladder is distended. Consider ultrasound and/or ERCP if there is clinical evidence of biliary obstruction. 3. Aortic atherosclerosis. 4. Uterine leiomyomas. 5. Moderate amount of fecal matter in the colon. Aortic Atherosclerosis  (ICD10-I70.0).  A Electronically Signed   By: Paulina Fusi M.D.   On: 10/19/2020 18:24   CT L-SPINE NO CHARGE  Result Date: 10/19/2020 CLINICAL DATA:  Decubitus ulcer.  In EXAM: CT LUMBAR SPINE WITHOUT CONTRAST TECHNIQUE: Multidetector CT imaging  of the lumbar spine was performed without intravenous contrast administration. Multiplanar CT image reconstructions were also generated. COMPARISON:  CT abdomen same day.  Previous lumbar radiographs. FINDINGS: Segmentation: 5 lumbar type vertebral bodies. Alignment: Thoracolumbar curvature convex to the right and lower lumbar curvature convex to the left. Vertebrae: There is been previous fusion from L1 to the sacrum, with hardware removal. Screw shadows remain visible in the pedicles. There appears to be solid union throughout that segment. There are hypertrophic sclerotic changes of the posterior elements at T11, T12 and L1 which could relate to chronic degenerative change, but the possibility of chronic osteomyelitis in that region is not excluded. There is no gross destructive bone change. The posterior elements at T11, T12 and L1 are immediately at the area of the decubitus ulcer. Increased kyphotic curvature in that region probably places the overlying soft tissue at risk. There are old minor compression deformities at T11 and T12 Paraspinal and other soft tissues: No evidence of para spinous soft tissue abscess. Disc levels: As noted above, distant fusion through the lumbosacral region with sufficient patency of the canal. IMPRESSION: 1. Previous fusion from L1 to the sacrum with hardware removal. There appears to be solid union throughout that segment. 2. Hypertrophic sclerotic changes of the posterior elements at T11, T12 and L1 which could relate to chronic degenerative change, but the possibility of chronic osteomyelitis in that region is not excluded. There is no gross destructive bone change. There is increased kyphotic curvature in that region which  probably places the overlying soft tissue at risk. There is no evidence of para spinous soft tissue abscess. The posterior elements of T11, T12 and L1 are closely related to the area of decubitus ulcer. Electronically Signed   By: Paulina Fusi M.D.   On: 10/19/2020 18:31   DG CHEST PORT 1 VIEW  Result Date: 10/21/2020 CLINICAL DATA:  Cough.  Recent osteomyelitis EXAM: PORTABLE CHEST 1 VIEW COMPARISON:  May 23, 2020. FINDINGS: No edema or airspace opacity. Heart size and contour normal. Pulmonary vascular within normal limits. There is aortic atherosclerosis. No adenopathy. Degenerative change noted in the right shoulder. IMPRESSION: No edema or airspace opacity. Heart size within normal limits. Aortic Atherosclerosis (ICD10-I70.0). Electronically Signed   By: Bretta Bang III M.D.   On: 10/21/2020 11:54   VAS [place] UPPER EXTREMITY VENOUS DUPLEX  Result Date: 10/21/2020 UPPER VENOUS STUDY  Indications: Swelling Risk Factors: DVT Hx DVT lower ext. Comparison Study: 12/21 Negative Performing Technologist: Clint Guy RVT  Examination Guidelines: A complete evaluation includes B-mode imaging, spectral Doppler, color Doppler, and power Doppler as needed of all accessible portions of each vessel. Bilateral testing is considered an integral part of a complete examination. Limited examinations for reoccurring indications may be performed as noted.  Left Findings: +----------+------------+---------+-----------+----------+--------------+ LEFT      CompressiblePhasicitySpontaneousProperties   Summary     +----------+------------+---------+-----------+----------+--------------+ IJV                                                 Not visualized +----------+------------+---------+-----------+----------+--------------+ Subclavian               Yes       Yes                             +----------+------------+---------+-----------+----------+--------------+ Axillary  Yes        Yes                             +----------+------------+---------+-----------+----------+--------------+ Brachial      Full       Yes       Yes                             +----------+------------+---------+-----------+----------+--------------+ Radial        Full                                                 +----------+------------+---------+-----------+----------+--------------+ Ulnar         Full                                                 +----------+------------+---------+-----------+----------+--------------+ Cephalic      Full                                                 +----------+------------+---------+-----------+----------+--------------+ Basilic                                             Not visualized +----------+------------+---------+-----------+----------+--------------+  Summary:  Left: No evidence of deep vein thrombosis in the upper extremity. No evidence of superficial vein thrombosis in the upper extremity. However, unable to visualize the IJV and Basilic vein not visualized.  *See table(s) above for measurements and observations.  Diagnosing physician: Coral ElseVance Brabham MD Electronically signed by Coral ElseVance Brabham MD on 10/21/2020 at 4:16:40 PM.    Final     Labs:  CBC: Recent Labs    10/25/20 0458 10/25/20 1626 10/26/20 0606 10/27/20 0303 10/28/20 0331 10/29/20 0330  WBC 7.9 7.9 9.2  --   --  8.6  HGB 8.2* 8.7* 9.0* 9.2* 8.7* 7.9*  HCT 27.1* 28.7* 29.6* 29.7* 28.4* 25.7*  PLT 140* 150 159  --   --  163    COAGS: Recent Labs    02/16/20 2142 05/06/20 0123 07/25/20 1549 07/26/20 0103 07/31/20 0158 08/01/20 0147 10/28/20 1154 10/29/20 0330  INR 1.0 1.6* 1.5*  --   --   --   --   --   APTT 23* 45* 33   < > 67* 103* 30 86*   < > = values in this interval not displayed.    BMP: Recent Labs    05/11/20 0243 05/12/20 0311 05/13/20 0211 05/14/20 1408 05/22/20 0541 10/26/20 0606 10/27/20 0303 10/28/20 0331  10/29/20 0330  NA 139 141 136 139   < > 139 141 140 142  K 3.6 3.5 3.7 4.2   < > 4.0 3.0* 3.6 3.9  CL 106 106 102 104   < > 113* 111 112* 114*  CO2 25 25 24 24    < > 20* 22 21* 22  GLUCOSE 99 87 107* 96   < >  122* 77 74 81  BUN 10 11 12 11    < > 9 8 7* 18  CALCIUM 8.2* 8.5* 8.6* 8.9   < > 8.5* 8.8* 8.6* 8.6*  CREATININE 0.70 0.72 0.77 0.73   < > 0.39* 0.37* 0.48 0.42*  GFRNONAA >60 >60 >60 >60   < > >60 >60 >60 >60  GFRAA >60 >60 >60 >60  --   --   --   --   --    < > = values in this interval not displayed.    LIVER FUNCTION TESTS: Recent Labs    08/02/20 0200 10/23/20 0912 10/24/20 0302 10/25/20 0458 10/25/20 1626 10/26/20 0606 10/27/20 0303 10/28/20 0331 10/29/20 0330  BILITOT 0.7 1.1 1.1 0.7  --   --   --   --   --   AST 22 20 20 17   --   --   --   --   --   ALT 8 15 15 15   --   --   --   --   --   ALKPHOS 57 133* 123 109  --   --   --   --   --   PROT 5.2* 5.8* 5.4* 5.0*  --   --   --   --   --   ALBUMIN 1.5* 2.1* 1.9* 1.8*   < > 1.8* 1.8* 1.8* 1.6*   < > = values in this interval not displayed.    TUMOR MARKERS: No results for input(s): AFPTM, CEA, CA199, CHROMGRNA in the last 8760 hours.  Assessment and Plan:  Poor oral intake.  Will proceed with image guided placement of a gastrostomy tube on Monday, March 21 as IR schedule allows.  *Note = Patient does not open her mouth wide and this could present a challenge with placement.  Risks and benefits image guided gastrostomy tube placement was discussed with the patient and her son including, but not limited to the need for a barium enema during the procedure, bleeding, infection, peritonitis and/or damage to adjacent structures.  All questions were answered, patient is agreeable to proceed.  Consent signed and in chart.  Thank you for this interesting consult.  I greatly enjoyed meeting [name] and look forward to participating in their care.  A copy of this report was sent to the requesting provider  on this date.  Electronically Signed: Gwynneth Macleod, PA-C   10/29/2020, 1:56 PM      I spent a total of 20 Minutes in face to face in clinical consultation, greater than 50% of which was counseling/coordinating care for gastrostomy tube placement.

## 2020-10-29 NOTE — Plan of Care (Signed)
  Problem: Clinical Measurements: Goal: Will remain free from infection Outcome: Progressing   Problem: Skin Integrity: Goal: Risk for impaired skin integrity will decrease Outcome: Progressing   Problem: Nutrition: Goal: Adequate nutrition will be maintained Outcome: Not Progressing

## 2020-10-29 NOTE — Progress Notes (Signed)
PROGRESS NOTE  KOURTNI STINEMAN UMP:[id number] DOB: 27-Nov-1940   PCP: [name], MD  Patient is from: Home.  Bedbound and dependent for all ADLs at baseline.  DOA: 10/19/2020 LOS: 10  Chief complaints: Wound infection  Brief Narrative / Interim history: 80 year old F with PMH of dementia, recent vertebral osteomyelitis (completed antibiotics on 09/08/2020), multiple decubitus ulcer, DVT/PE on Eliquis, OSA, HTN and HLD brought to ED due to failure to thrive and admitted for hypernatremia, infected decubitus ulcer and failure to thrive.  Reportedly failed outpatient treatment with p.o. antibiotics.  She was treated with IV antibiotics and hypotonic IV fluid.   Hospital course complicated by acute hypoxemic respiratory failure thought to be due to aspiration in the setting of acute metabolic encephalopathy requiring transfer to ICU/stepdown for close monitoring.  Eventually weaned off oxygen.  SLP recommended full liquid diet.  CODE STATUS changed to DNR/DNI per discussion between previous attending and family. Main issue is poor p.o. intake.  She is currently on dysphagia 3 diet but p.o. intake remains poor.  IR consulted for PEG tube.   Subjective: Seen and examined earlier this morning.  No major events overnight or this morning.  No complaints but not a great historian.  P.o. intake remains poor.  She says she does not have an appetite.  Denies pain or shortness of breath.  Objective: Vitals:   10/28/20 0425 10/28/20 1220 10/28/20 2043 10/29/20 0400  BP: (!) 126/55 (!) 124/57 (!) 101/55 114/60  Pulse: 90 61 75 60  Resp: 18 12 16    Temp: 97.8 F (36.6 C) 98.7 F (37.1 C) 98.9 F (37.2 C) 98.9 F (37.2 C)  TempSrc: Oral Oral Oral Oral  SpO2: 96% 96% 98% 96%  Weight:      Height:        Intake/Output Summary (Last 24 hours) at 10/29/2020 1129 Last data filed at 10/29/2020 1021 Gross per 24 hour  Intake 318.32 ml  Output 250 ml  Net 68.32 ml   Filed Weights   10/19/20  2107  Weight: 61.3 kg    Examination:  GENERAL: Frail and chronically ill-looking.  Nontoxic. HEENT: MMM.  Vision and hearing grossly intact.  NECK: Supple.  No apparent JVD.  RESP: 96% on RA.  No IWOB.  Fair aeration bilaterally. CVS:  RRR. Heart sounds normal.  ABD/GI/GU: BS+. Abd soft, NTND.  MSK/EXT:  Moves extremities. No apparent deformity.  Trace edema. SKIN: Pressure skin injuries over upper lumbar area, sacral and left buttock.  See picture in media section NEURO: Awake, alert and oriented fairly.  No apparent focal neuro deficit. PSYCH: Calm. Normal affect.    Procedures:  None  Microbiology summarized: 3/10-COVID-19 PCR nonreactive 3/10-blood culture with Staphylococcus auricularis in aerobic bottles from single set 3/15-repeat blood cultures NGTD  Assessment & Plan: Infected multiple decubitus ulcer, POA -CT of the abdomen pelvis does not show any acute osteomyelitis at this time. 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 -Initial blood culture contaminated with staph auricularis in aerobic bottle.  Repeat blood culture NGTD -Vanco and cefepime 3/10-3/16>> p.o. Amoxil 3/16>> for 4 weeks. -Outpatient follow-up with ID in 4 weeks. -IR consulted for PEG tube placement to optimize nutrition as p.o. intake remains poor.  Acute hypoxic respiratory failure 2/2 aspiration event, resolving -CTA chest on 3/12 with complete collapse of RLL with large debris's in LLL bronchus  intermedius -Upgraded to dysphagia-3 by SLP -Continue aspiration precautions -Antibiotics as above.  Acute metabolic encephalopathy: Seems to be at baseline Chronic dementia without behavioral disturbance -Oriented to self, place and person -Discontinued Aricept in the setting of bradycardia -Continue  Namenda -Reorientation and delirium precautions  Sinus bradycardia: Resolved after stopping Aricept. -Continue holding Aricept -Replenish electrolytes  Hypovolemic hypernatremia: Likely due to poor p.o. intake.  Resolved. -Continue monitoring -Encourage p.o. intake pending PEG tube  Hypokalemia/hypophosphatemia/hypomagnesemia:  Refeeding syndrome?  Resolved. -Continue monitoring  Essential hypertension: Normotensive. 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 -IV heparin instead of home Eliquis pending PEG tube placement  Normocytic anemia: Gradual drop in H&H.  No evidence of GI bleed.  Could be due to poor nutrition Recent Labs    10/19/20 1711 10/21/20 0553 10/23/20 0912 10/24/20 0302 10/25/20 0458 10/25/20 1626 10/26/20 0606 10/27/20 0303 10/28/20 0331 10/29/20 0330  HGB 12.2 10.6* 12.3 9.5* 8.2* 8.7* 9.0* 9.2* 8.7* 7.9*  -Monitor intermittently -Check anemia panel -Hemoccult if further drop  Thrombocytopenia: Resolved. Recent Labs  Lab 10/23/20 0912 10/24/20 0302 10/25/20 0458 10/25/20 1626 10/26/20 0606 10/29/20 0330  PLT 116* 102* 140* 150 159 163   Recurrent hypoglycemia: Likely due to poor p.o. intake.  Not on insulin.  BMP glucose in 70s to 80s -Encourage p.o. intake  -Appreciate help by dietitian.  -IR consult for PEG tube placement  Goal of care: DNR/DNI per discussion between previous provider and patient's family.  Family wants PEG tube placed to optimize nutrition and wound healing. -Needs outpatient palliative care follow-up  Failure to thrive in the setting of poor oral intake -Upgraded to dysphagia-3 diet but caloric intake remains poor -IR consulted for PEG tube placement. Body mass index is 24.72 kg/m. Nutrition Problem: Increased nutrient needs Etiology: acute illness,wound healing Signs/Symptoms: estimated needs Interventions: Hormel Shake,Boost Breeze,Prostat,Magic cup,MVI,Juven    Pressure skin injury: POA Pressure Injury 10/19/20 Vertebral column Left;Mid Unstageable - Full thickness tissue loss in which the base of the injury is covered by slough (yellow, tan, gray, green or brown) and/or eschar (tan, brown or black) in the wound bed. eschar d/t removal  (Active)  10/19/20 2020  Location: Vertebral column  Location Orientation: Left;Mid  Staging: Unstageable - Full thickness tissue loss in which the base of the injury is covered by slough (yellow, tan, gray, green or brown) and/or eschar (tan, brown or black) in the wound bed.  Wound Description (Comments): eschar d/t removal of medical equipment which became infected (6x5)  Present on Admission: Yes     Pressure Injury 10/19/20 Buttocks Left Stage 4 - Full thickness tissue loss with exposed bone, tendon or muscle. 7x2x3, tunneling at 12'oclock-6cm, 9'oclock-1.5cm. (Active)  10/19/20 2020  Location: Buttocks  Location Orientation: Left  Staging: Stage 4 - Full thickness tissue loss with exposed bone, tendon or muscle.  Wound Description (Comments): 7x2x3, tunneling at 12'oclock-6cm, 9'oclock-1.5cm.  Present on Admission: Yes     Pressure Injury 10/19/20 Buttocks Mid Stage 3 -  Full thickness tissue loss. Subcutaneous fat may be visible but bone, tendon or muscle are NOT exposed. intragluteal cluster, 8.5x5 (Active)  10/19/20 2020  Location: Buttocks  Location Orientation: Mid  Staging: Stage 3 -  Full thickness tissue loss. Subcutaneous fat may be visible but bone, tendon or muscle are NOT exposed.  Wound Description (Comments): intragluteal cluster, 8.5x5  Present on Admission: Yes   DVT prophylaxis:  On IV heparin for VTE treatment  Code Status: DNR/DNI  Family Communication: Updated patient's son over the phone on 3/19. Level of care: Telemetry Status is: Inpatient  Remains inpatient appropriate because:Ongoing diagnostic testing needed not appropriate for outpatient work up, Inpatient level of care  appropriate due to severity of illness and PEG tube placement.   Dispo: The patient is from: Home              Anticipated d/c is to: Home              Patient currently is not medically stable to d/c.   Difficult to place patient No       Consultants:  Infectious disease Interventional radiology   Sch Meds:  Scheduled Meds: . (feeding supplement) PROSource Plus  30 mL Oral BID BM  . amoxicillin  500 mg Oral Q8H  . vitamin C  1,000 mg Oral Daily  . busPIRone  15 mg Oral BID  . Chlorhexidine Gluconate Cloth  6 each Topical Daily  . collagenase   Topical Daily  . dronabinol  2.5 mg Oral QHS  . feeding supplement  1 Container Oral BID BM  . ferrous sulfate  325 mg Oral Q breakfast  . linaclotide  145 mcg Oral Daily  . mouth rinse  15 mL Mouth Rinse BID  . memantine  28 mg Oral QHS  . multivitamin with minerals  1 tablet Oral Daily  . nutrition supplement (JUVEN)  1 packet Oral BID BM  . sodium chloride flush  10-40 mL Intracatheter Q12H   Continuous Infusions: . sodium chloride 10 mL/hr at 10/25/20 0451  . heparin 850 Units/hr (10/28/20 2314)   PRN Meds:.sodium chloride, acetaminophen, nitroGLYCERIN, oxyCODONE, sodium chloride flush  Antimicrobials: Anti-infectives (From admission, onward)   Start     Dose/Rate Route Frequency Ordered Stop   10/26/20 0930  amoxicillin (AMOXIL) capsule 500 mg        500 mg Oral Every 8 hours 10/26/20 0833 11/23/20 0559   10/21/20 2200  vancomycin (VANCOREADY) IVPB 750 mg/150 mL  Status:  Discontinued        750 mg 150 mL/hr over 60 Minutes Intravenous Every 24 hours 10/21/20 0739 10/26/20 0833   10/20/20 2200  vancomycin (VANCOREADY) IVPB 1250 mg/250 mL  Status:  Discontinued        1,250 mg 166.7 mL/hr over 90 Minutes Intravenous Every 24 hours 10/19/20 2253 10/21/20 0739   10/19/20 2315  ceFEPIme (MAXIPIME) 2 g in sodium chloride 0.9 % 100 mL IVPB  Status:  Discontinued        2 g 200 mL/hr over 30 Minutes Intravenous Every 12  hours 10/19/20 2226 10/26/20 0833   10/19/20 2300  vancomycin (VANCOREADY) IVPB 1000 mg/200 mL        1,000 mg 200 mL/hr over 60 Minutes Intravenous  Once 10/19/20 2209 10/20/20 0135   10/19/20 2300  ceFEPIme (MAXIPIME) 2 g in sodium chloride 0.9 % 100 mL IVPB  Status:  Discontinued        2 g 200 mL/hr over 30 Minutes Intravenous  Once 10/19/20 2209 10/19/20 2226   10/19/20 1615  ceFAZolin (ANCEF) IVPB 1 g/50 mL premix        1 g 100 mL/hr over 30 Minutes Intravenous  Once 10/19/20 1614 10/19/20 1931       I have personally reviewed the following labs and images: CBC: Recent Labs  Lab 10/24/20 0302 10/25/20 0458 10/25/20 1626 10/26/20 0606 10/27/20 0303 10/28/20 0331 10/29/20 0330  WBC 7.5 7.9 7.9 9.2  --   --  8.6  HGB 9.5* 8.2* 8.7* 9.0* 9.2* 8.7* 7.9*  HCT 30.7* 27.1* 28.7* 29.6* 29.7* 28.4* 25.7*  MCV 100.7* 103.0* 102.5* 102.1*  --   --  104.5*  PLT 102* 140* 150 159  --   --  163   BMP &GFR Recent Labs  Lab 10/25/20 0458 10/25/20 1626 10/26/20 0606 10/27/20 0303 10/28/20 0331 10/29/20 0330  NA 146* 142 139 141 140 142  K 2.9* 2.7* 4.0 3.0* 3.6 3.9  CL 117* 113* 113* 111 112* 114*  CO2 19* 21* 20* 22 21* 22  GLUCOSE 73 137* 122* 77 74 81  BUN 11 10 9 8  7* 18  CREATININE 0.41* 0.49 0.39* 0.37* 0.48 0.42*  CALCIUM 8.8* 8.6* 8.5* 8.8* 8.6* 8.6*  MG 1.6*  --  1.8 1.8 2.1 2.0  PHOS  --  1.5* 2.7 2.1* 3.3 2.6   Estimated Creatinine Clearance: 48.3 mL/min (A) (by C-G formula based on SCr of 0.42 mg/dL (L)). Liver & Pancreas: Recent Labs  Lab 10/23/20 0912 10/24/20 0302 10/25/20 0458 10/25/20 1626 10/26/20 0606 10/27/20 0303 10/28/20 0331 10/29/20 0330  AST 20 20 17   --   --   --   --   --   ALT 15 15 15   --   --   --   --   --   ALKPHOS 133* 123 109  --   --   --   --   --   BILITOT 1.1 1.1 0.7  --   --   --   --   --   PROT 5.8* 5.4* 5.0*  --   --   --   --   --   ALBUMIN 2.1* 1.9* 1.8* 1.8* 1.8* 1.8* 1.8* 1.6*   No results for input(s): LIPASE,  AMYLASE in the last 168 hours. No results for input(s): AMMONIA in the last 168 hours. Diabetic: No results for input(s): HGBA1C in the last 72 hours. Recent Labs  Lab 10/23/20 2349 10/23/20 2350 10/24/20 0105 10/24/20 0106  GLUCAP 52* 63* 88 84   Cardiac Enzymes: No results for input(s): CKTOTAL, CKMB, CKMBINDEX, TROPONINI in the last 168 hours. No results for input(s): PROBNP in the last 8760 hours. Coagulation Profile: No results for input(s): INR, PROTIME in the last 168 hours. Thyroid Function Tests: No results for input(s): TSH, T4TOTAL, FREET4, T3FREE, THYROIDAB in the last 72 hours. Lipid Profile: No results for input(s): CHOL, HDL, LDLCALC, TRIG, CHOLHDL, LDLDIRECT in the last 72 hours. Anemia Panel: No results for input(s): VITAMINB12, FOLATE, FERRITIN, TIBC, IRON, RETICCTPCT in the last 72 hours. Urine analysis:    Component Value Date/Time   COLORURINE STRAW (A) 10/19/2020 1942   APPEARANCEUR CLEAR 10/19/2020 1942   LABSPEC 1.021 10/19/2020 1942   PHURINE 9.0 (H) 10/19/2020 1942   GLUCOSEU NEGATIVE 10/19/2020 1942   HGBUR NEGATIVE 10/19/2020 1942   BILIRUBINUR NEGATIVE 10/19/2020 1942   KETONESUR NEGATIVE 10/19/2020 1942   PROTEINUR NEGATIVE 10/19/2020 1942   UROBILINOGEN 1.0 02/14/2012 1054   NITRITE NEGATIVE 10/19/2020 1942   LEUKOCYTESUR NEGATIVE 10/19/2020 1942   Sepsis Labs: Invalid input(s): PROCALCITONIN, LACTICIDVEN  Microbiology: Recent Results (from the past 240 hour(s))  Blood culture (routine x 2)     Status: None   Collection Time: 10/19/20  5:02 PM   Specimen: BLOOD LEFT FOREARM  Result Value Ref Range Status   Specimen Description   Final    BLOOD LEFT FOREARM Performed at [facility], [address] [place], [place]  [address]    Special Requests   Final    BOTTLES DRAWN AEROBIC AND ANAEROBIC Blood Culture results may not be optimal due to an excessive volume of blood received in culture bottles Performed at [facility], [address], [place], [place] [address]    Culture   Final    NO GROWTH 5 DAYS Performed at [facility], [address], [place], [place] [address]    Report Status 10/24/2020 FINAL  Final  SARS CORONAVIRUS 2 (TAT 6-24 HRS) Nasopharyngeal Nasopharyngeal Swab     Status: None   Collection Time: 10/19/20  5:14 PM   Specimen: Nasopharyngeal Swab  Result Value Ref Range Status   SARS Coronavirus 2 NEGATIVE NEGATIVE Final    Comment: (NOTE) SARS-CoV-2 target nucleic acids are NOT DETECTED.  The SARS-CoV-2 RNA is generally detectable in upper and lower respiratory specimens during the acute phase of infection. Negative results do not preclude SARS-CoV-2 infection, do not rule out co-infections with other pathogens, and should not be used as the sole basis for treatment or other patient management decisions. Negative results must be combined with clinical observations, patient history, and epidemiological information. The expected result is Negative.  Fact Sheet for Patients: HairSlick.no  Fact Sheet for Healthcare Providers: quierodirigir.com  This test is not yet approved or cleared by the [place] FDA and  has been authorized for detection and/or diagnosis of SARS-CoV-2 by FDA under an Emergency Use Authorization (EUA). This EUA will remain  in effect (meaning this test can be used) for the duration of the COVID-19 declaration under Se ction 564(b)(1) of the Act, 21 U.S.C. section 360bbb-3(b)(1), unless the authorization is terminated or revoked sooner.  Performed at [facility], [address], [place], [place] [address]   Blood culture (routine x 2)     Status: Abnormal   Collection Time: 10/19/20  9:42 PM   Specimen: BLOOD  Result Value Ref Range Status   Specimen Description   Final    BLOOD BLOOD RIGHT HAND Performed at [facility], [address] [place], [place] [address]    Special Requests   Final    BOTTLES DRAWN AEROBIC ONLY Blood Culture adequate volume Performed at [facility], [address], [place], [place] [address]    Culture  Setup Time   Final    GRAM POSITIVE COCCI IN CLUSTERS AEROBIC BOTTLE ONLY CRITICAL RESULT CALLED TO, READ BACK BY AND VERIFIED WITH: PHARMD ANH PHAM BY MESSAN H. AT [id number] ON 10/21/2020    Culture (A)  Final    STAPHYLOCOCCUS AURICULARIS THE SIGNIFICANCE OF ISOLATING THIS ORGANISM FROM A SINGLE SET OF BLOOD CULTURES WHEN MULTIPLE SETS ARE DRAWN IS UNCERTAIN. PLEASE NOTIFY THE MICROBIOLOGY DEPARTMENT WITHIN ONE WEEK IF SPECIATION AND SENSITIVITIES ARE REQUIRED. Performed at [facility], [address] [place], [place] [address]    Report Status 10/23/2020 FINAL  Final  Blood Culture ID Panel (Reflexed)     Status: Abnormal   Collection Time: 10/19/20  9:42 PM  Result Value Ref Range Status   Enterococcus faecalis NOT DETECTED NOT DETECTED Final   Enterococcus Faecium NOT DETECTED NOT DETECTED Final   Listeria monocytogenes NOT DETECTED NOT DETECTED Final   Staphylococcus species DETECTED (A) NOT DETECTED Final    Comment: CRITICAL RESULT CALLED TO, READ BACK BY AND VERIFIED WITH: PHARMD ANH PHAM BY MESSAN H. AT [id number] ON 10/21/2020    Staphylococcus aureus (BCID) NOT  DETECTED NOT DETECTED Final   Staphylococcus epidermidis NOT DETECTED NOT DETECTED Final   Staphylococcus lugdunensis NOT DETECTED NOT DETECTED Final   Streptococcus species NOT DETECTED NOT DETECTED Final   Streptococcus agalactiae NOT DETECTED NOT DETECTED Final   Streptococcus pneumoniae NOT DETECTED NOT DETECTED Final   Streptococcus pyogenes NOT DETECTED NOT DETECTED Final   A.calcoaceticus-baumannii NOT DETECTED NOT DETECTED Final   Bacteroides fragilis NOT DETECTED NOT DETECTED Final   Enterobacterales NOT DETECTED NOT DETECTED Final   Enterobacter cloacae complex NOT DETECTED NOT DETECTED Final    Escherichia coli NOT DETECTED NOT DETECTED Final   Klebsiella aerogenes NOT DETECTED NOT DETECTED Final   Klebsiella oxytoca NOT DETECTED NOT DETECTED Final   Klebsiella pneumoniae NOT DETECTED NOT DETECTED Final   Proteus species NOT DETECTED NOT DETECTED Final   Salmonella species NOT DETECTED NOT DETECTED Final   Serratia marcescens NOT DETECTED NOT DETECTED Final   Haemophilus influenzae NOT DETECTED NOT DETECTED Final   Neisseria meningitidis NOT DETECTED NOT DETECTED Final   Pseudomonas aeruginosa NOT DETECTED NOT DETECTED Final   Stenotrophomonas maltophilia NOT DETECTED NOT DETECTED Final   Candida albicans NOT DETECTED NOT DETECTED Final   Candida auris NOT DETECTED NOT DETECTED Final   Candida glabrata NOT DETECTED NOT DETECTED Final   Candida krusei NOT DETECTED NOT DETECTED Final   Candida parapsilosis NOT DETECTED NOT DETECTED Final   Candida tropicalis NOT DETECTED NOT DETECTED Final   Cryptococcus neoformans/gattii NOT DETECTED NOT DETECTED Final    Comment: Performed at [facility], [address], [place], [place] [address]  MRSA PCR Screening     Status: Abnormal   Collection Time: 10/21/20  4:15 PM   Specimen: Nasopharyngeal  Result Value Ref Range Status   MRSA by PCR POSITIVE (A) NEGATIVE Final    Comment:        The GeneXpert MRSA Assay (FDA approved for NASAL specimens only), is one component of a comprehensive MRSA colonization surveillance program. It is not intended to diagnose MRSA infection nor to guide or monitor treatment for MRSA infections. RESULT CALLED TO, READ BACK BY AND VERIFIED WITH: MIKAYLA HAILEY RN 10/21/20 @1902  BY P.HENDERSON Performed at [facility], [address] [place], [place] [address]   Culture, blood (routine x 2)     Status: None (Preliminary result)   Collection Time: 10/24/20  3:02 AM   Specimen: BLOOD  Result Value Ref Range Status   Specimen Description   Final    BLOOD BLOOD LEFT  HAND Performed at [facility], [address], [place], [place] [address]    Special Requests   Final    BOTTLES DRAWN AEROBIC ONLY Blood Culture adequate volume Performed at [facility], [address] [place], [place] [address]    Culture   Final    NO GROWTH 4 DAYS Performed at [facility], [address], [place], [place] [address]    Report Status PENDING  Incomplete  Culture, blood (routine x 2)     Status: None (Preliminary result)   Collection Time: 10/24/20  3:02 AM   Specimen: BLOOD  Result Value Ref Range Status   Specimen Description   Final    BLOOD BLOOD RIGHT HAND Performed at [facility], [address], [place], [place] [address]    Special Requests   Final    BOTTLES DRAWN AEROBIC ONLY Blood Culture adequate volume Performed at [facility], [address] [place], [place] [address]    Culture   Final    NO GROWTH 4 DAYS Performed at [facility], [address], [place], [place] [address]    Report Status PENDING  Incomplete    Radiology Studies: No results found.    Taye T. Gonfa [facility]  If 7PM-7AM, please contact night-coverage [email or web address] 10/29/2020, 11:29 AM

## 2020-10-29 NOTE — Progress Notes (Addendum)
ANTICOAGULATION CONSULT NOTE  Pharmacy Consult for IV heparin  Indication: bridge therapy while Apixaban for hx DVT/PE on hold for PEG placement  Allergies  Allergen Reactions  . T-Pa [Alteplase] Swelling    Does okay with it if premedicated with benadryl.  . Codeine Itching    Patient Measurements: Height: 5\' 2"  (157.5 cm) Weight: 61.3 kg (135 lb 2.3 oz) IBW/kg (Calculated) : 50.1 Heparin Dosing Weight: actual body weight   Vital Signs: Temp: 98.9 F (37.2 C) (03/20 0400) Temp Source: Oral (03/20 0400) BP: 114/60 (03/20 0400) Pulse Rate: 60 (03/20 0400)  Labs: Recent Labs    10/27/20 0303 10/28/20 0331 10/28/20 1154 10/29/20 0330  HGB 9.2* 8.7*  --  7.9*  HCT 29.7* 28.4*  --  25.7*  PLT  --   --   --  163  APTT  --   --  30 86*  HEPARINUNFRC  --   --  >2.20* >2.20*  CREATININE 0.37* 0.48  --  0.42*    Estimated Creatinine Clearance: 48.3 mL/min (A) (by C-G formula based on SCr of 0.42 mg/dL (L)).   Medical History: Past Medical History:  Diagnosis Date  . Arthritis   . Asthma   . Chronic back pain   . Depression   . History of pulmonary embolus (PE)   . Hx-TIA (transient ischemic attack)   . Hyperlipemia   . Hypertension   . Personal history of DVT (deep vein thrombosis)   . Scoliosis   . Sleep apnea    cpap - settingsi at 3   . Stroke (HCC)   . 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 Pharmacy consulted to dose IV heparin for bridge therapy while apixaban on hold for IR to place PEG tube. Last dose of Apixaban 3/19 at 9:09 AM. Baseline heparin level > 2.2, as expected due to recent Apixaban. Baseline aPTT 30 seconds.   Today, 10/29/20:  AM heparin level and aPTT drawn only 4 hours after heparin initiation (instead of intended 8 hours)  Heparin level > 2.2, falsely elevated due to recent Apixaban  APTT = 86 seconds, within therapeutic range  CBC: Hgb decreased to 7.9, Pltc WNL  No  bleeding or infusion issues noted per nursing  Goal of Therapy:  Heparin level 0.3-0.7 units/ml aPTT 66-102 seconds Monitor platelets by anticoagulation protocol: Yes   Plan:   Continue heparin infusion at 850 units/hr   Will use aPTT for dose titration/monitoring until aPTT and heparin level correlate, effects of Apixaban on heparin level have dissipated  aPTT at 1130  Daily CBC, aPTT, heparin level  Monitor closely for s/sx of bleeding  F/u timing of PEG placement   10/31/20, PharmD, BCPS Clinical Pharmacist  10/29/2020 10:08 AM  Addendum:   Delay in aPTT lab draw. APTT just resulted at 111 seconds. RN reports heparin infusing in peripheral IV in L arm, labs drawn from midline on R. No bleeding or infusion issues noted per nursing.   Plan:  Decrease heparin infusion to 750 units/hr  APTT, heparin level 8 hours after rate change  IR: Please indicate when heparin infusion needs to be held prior to PEG tube placement on 3/21.    4/21, PharmD, BCPS Clinical Pharmacist  10/29/2020 4:22 PM

## 2020-10-30 ENCOUNTER — Inpatient Hospital Stay (HOSPITAL_COMMUNITY): Payer: Medicare Other

## 2020-10-30 HISTORY — PX: IR GASTROSTOMY TUBE MOD SED: IMG625

## 2020-10-30 LAB — PREALBUMIN: Prealbumin: 7.1 mg/dL — ABNORMAL LOW (ref 18–38)

## 2020-10-30 LAB — HEPARIN LEVEL (UNFRACTIONATED): Heparin Unfractionated: 1.42 IU/mL — ABNORMAL HIGH (ref 0.30–0.70)

## 2020-10-30 LAB — CBC
HCT: 23.4 % — ABNORMAL LOW (ref 36.0–46.0)
Hemoglobin: 7.2 g/dL — ABNORMAL LOW (ref 12.0–15.0)
MCH: 31.9 pg (ref 26.0–34.0)
MCHC: 30.8 g/dL (ref 30.0–36.0)
MCV: 103.5 fL — ABNORMAL HIGH (ref 80.0–100.0)
Platelets: 171 10*3/uL (ref 150–400)
RBC: 2.26 MIL/uL — ABNORMAL LOW (ref 3.87–5.11)
RDW: 18.5 % — ABNORMAL HIGH (ref 11.5–15.5)
WBC: 9.7 10*3/uL (ref 4.0–10.5)
nRBC: 0 % (ref 0.0–0.2)

## 2020-10-30 LAB — RENAL FUNCTION PANEL
Albumin: 1.5 g/dL — ABNORMAL LOW (ref 3.5–5.0)
Anion gap: 3 — ABNORMAL LOW (ref 5–15)
BUN: 16 mg/dL (ref 8–23)
CO2: 23 mmol/L (ref 22–32)
Calcium: 8.4 mg/dL — ABNORMAL LOW (ref 8.9–10.3)
Chloride: 114 mmol/L — ABNORMAL HIGH (ref 98–111)
Creatinine, Ser: 0.43 mg/dL — ABNORMAL LOW (ref 0.44–1.00)
GFR, Estimated: >60 mL/min (ref >60)
Glucose, Bld: 78 mg/dL (ref 70–99)
Phosphorus: 2.2 mg/dL — ABNORMAL LOW (ref 2.5–4.6)
Potassium: 3.7 mmol/L (ref 3.5–5.1)
Sodium: 140 mmol/L (ref 135–145)

## 2020-10-30 LAB — GLUCOSE, CAPILLARY
Glucose-Capillary: 64 mg/dL — ABNORMAL LOW (ref 70–99)
Glucose-Capillary: 69 mg/dL — ABNORMAL LOW (ref 70–99)
Glucose-Capillary: 92 mg/dL (ref 70–99)

## 2020-10-30 LAB — APTT
aPTT: 110 seconds — ABNORMAL HIGH (ref 24–36)
aPTT: 32 seconds (ref 24–36)

## 2020-10-30 LAB — PROTIME-INR
INR: 1.4 — ABNORMAL HIGH (ref 0.8–1.2)
Prothrombin Time: 16.2 seconds — ABNORMAL HIGH (ref 11.4–15.2)

## 2020-10-30 LAB — HEMOGLOBIN AND HEMATOCRIT, BLOOD
HCT: 26.6 % — ABNORMAL LOW (ref 36.0–46.0)
Hemoglobin: 7.9 g/dL — ABNORMAL LOW (ref 12.0–15.0)

## 2020-10-30 LAB — MAGNESIUM: Magnesium: 2 mg/dL (ref 1.7–2.4)

## 2020-10-30 MED ORDER — OSMOLITE 1.5 CAL PO LIQD
1000.0000 mL | ORAL | Status: DC
  Administered 2020-10-31 – 2020-11-06 (×3): 1000 mL
  Filled 2020-10-30 (×10): qty 1000

## 2020-10-30 MED ORDER — JUVEN PO PACK
1.0000 | PACK | Freq: Two times a day (BID) | ORAL | Status: DC
  Administered 2020-10-31 – 2020-11-07 (×15): 1
  Filled 2020-10-30 (×17): qty 1

## 2020-10-30 MED ORDER — LIDOCAINE HCL 1 % IJ SOLN
INTRAMUSCULAR | Status: AC
  Filled 2020-10-30: qty 20

## 2020-10-30 MED ORDER — CEFAZOLIN SODIUM-DEXTROSE 2-4 GM/100ML-% IV SOLN
INTRAVENOUS | Status: AC
  Administered 2020-10-30: 2 g via INTRAVENOUS
  Filled 2020-10-30: qty 100

## 2020-10-30 MED ORDER — GLUCAGON HCL RDNA (DIAGNOSTIC) 1 MG IJ SOLR
INTRAMUSCULAR | Status: AC
  Filled 2020-10-30: qty 1

## 2020-10-30 MED ORDER — PROSOURCE TF PO LIQD
45.0000 mL | Freq: Two times a day (BID) | ORAL | Status: DC
  Administered 2020-10-31 – 2020-11-07 (×13): 45 mL
  Filled 2020-10-30 (×18): qty 45

## 2020-10-30 MED ORDER — IOHEXOL 300 MG/ML  SOLN
50.0000 mL | Freq: Once | INTRAMUSCULAR | Status: AC | PRN
  Administered 2020-10-30: 15 mL

## 2020-10-30 MED ORDER — DEXTROSE-NACL 5-0.45 % IV SOLN: INTRAVENOUS | Status: DC

## 2020-10-30 MED ORDER — MIDAZOLAM HCL 2 MG/2ML IJ SOLN
INTRAMUSCULAR | Status: AC | PRN
Start: 2020-10-30 — End: 2020-10-30
  Administered 2020-10-30: 0.5 mg via INTRAVENOUS

## 2020-10-30 MED ORDER — HEPARIN (PORCINE) 25000 UT/250ML-% IV SOLN
650.0000 [IU]/h | INTRAVENOUS | Status: DC
  Administered 2020-10-30 – 2020-11-01 (×2): 650 [IU]/h via INTRAVENOUS
  Filled 2020-10-30 (×3): qty 250

## 2020-10-30 MED ORDER — LIDOCAINE HCL (PF) 1 % IJ SOLN
INTRAMUSCULAR | Status: AC | PRN
  Administered 2020-10-30: 5 mL

## 2020-10-30 MED ORDER — FENTANYL CITRATE (PF) 100 MCG/2ML IJ SOLN
INTRAMUSCULAR | Status: AC | PRN
  Administered 2020-10-30: 50 ug via INTRAVENOUS

## 2020-10-30 MED ORDER — FENTANYL CITRATE (PF) 100 MCG/2ML IJ SOLN
INTRAMUSCULAR | Status: AC
  Filled 2020-10-30: qty 2

## 2020-10-30 MED ORDER — GLUCAGON HCL (RDNA) 1 MG IJ SOLR
INTRAMUSCULAR | Status: AC | PRN
Start: 2020-10-30 — End: 2020-10-30
  Administered 2020-10-30: .5 mL via INTRAVENOUS

## 2020-10-30 MED ORDER — POTASSIUM PHOSPHATES 15 MMOLE/5ML IV SOLN
30.0000 mmol | Freq: Once | INTRAVENOUS | Status: AC
  Administered 2020-10-30: 30 mmol via INTRAVENOUS
  Filled 2020-10-30: qty 10

## 2020-10-30 MED ORDER — MIDAZOLAM HCL 2 MG/2ML IJ SOLN
INTRAMUSCULAR | Status: AC
  Filled 2020-10-30: qty 2

## 2020-10-30 NOTE — Progress Notes (Signed)
Pt back on unit from sx at this time.

## 2020-10-30 NOTE — Plan of Care (Signed)
  Problem: Education: Goal: Knowledge of General Education information will improve Description: Including pain rating scale, medication(s)/side effects and non-pharmacologic comfort measures Outcome: Not Progressing   Problem: Activity: Goal: Risk for activity intolerance will decrease Outcome: Not Progressing   Problem: Nutrition: Goal: Adequate nutrition will be maintained Outcome: Not Progressing   

## 2020-10-30 NOTE — Progress Notes (Signed)
ANTICOAGULATION CONSULT NOTE  Pharmacy Consult for IV heparin  Indication: bridge therapy while Apixaban for hx DVT/PE on hold for PEG placement  Allergies  Allergen Reactions  . T-Pa [Alteplase] Swelling    Does okay with it if premedicated with benadryl.  . Codeine Itching    Patient Measurements: Height: 5\' 2"  (157.5 cm) Weight: 61.3 kg (135 lb 2.3 oz) IBW/kg (Calculated) : 50.1 Heparin Dosing Weight: actual body weight   Vital Signs: Temp: 97.7 F (36.5 C) (03/20 2030) Temp Source: Axillary (03/20 2030) BP: 142/66 (03/20 2030) Pulse Rate: 79 (03/20 2030)  Labs: Recent Labs    10/28/20 0331 10/28/20 0331 10/28/20 1154 10/29/20 0330 10/29/20 1441 10/30/20 0100  HGB 8.7*  --   --  7.9*  --  7.2*  HCT 28.4*  --   --  25.7*  --  23.4*  PLT  --   --   --  163  --  171  APTT  --    < > 30 86* 111* 110*  LABPROT  --   --   --   --   --  16.2*  INR  --   --   --   --   --  1.4*  HEPARINUNFRC  --   --  >2.20* >2.20*  --  1.42*  CREATININE 0.48  --   --  0.42*  --  0.43*   < > = values in this interval not displayed.    Estimated Creatinine Clearance: 48.3 mL/min (A) (by C-G formula based on SCr of 0.43 mg/dL (L)).   Medical History: Past Medical History:  Diagnosis Date  . Arthritis   . Asthma   . Chronic back pain   . Depression   . History of pulmonary embolus (PE)   . Hx-TIA (transient ischemic attack)   . Hyperlipemia   . Hypertension   . Personal history of DVT (deep vein thrombosis)   . Scoliosis   . Sleep apnea    cpap - settingsi at 3   . Stroke (HCC)   . 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 Pharmacy consulted to dose IV heparin for bridge therapy while apixaban on hold for IR to place PEG tube. Last dose of Apixaban 3/19 at 9:09 AM. Baseline heparin level > 2.2, as expected due to recent Apixaban. Baseline aPTT 30 seconds.   Today, 10/30/20:  Heparin level = 1.42, falsely elevated  due to recent Apixaban  APTT = 110 seconds, supratherapeutic   INR = 1.4  CBC: Hgb decreased to 7.2, Pltc WNL  No bleeding or infusion issues noted per nursing  Goal of Therapy:  Heparin level 0.3-0.7 units/ml aPTT 66-102 seconds Monitor platelets by anticoagulation protocol: Yes   Plan:   Decrease heparin infusion to 650 units/hr   Will use aPTT for dose titration/monitoring until aPTT and heparin level correlate, effects of Apixaban on heparin level have dissipated  Check aPTT 8 hr after heparin rate decreased  Daily CBC, aPTT, heparin level  Monitor closely for s/sx of bleeding  IR: Please indicate when heparin infusion needs to be held prior to PEG tube placement on 3/21.    4/21, PharmD 10/30/2020 2:19 AM

## 2020-10-30 NOTE — Progress Notes (Signed)
PROGRESS NOTE  [name] MWN:[id number] DOB: [birth year]   PCP: [name], MD  Patient is from: Home.  Bedbound and dependent for all ADLs at baseline.  DOA: 10/19/2020 LOS: 11  Chief complaints: Wound infection  Brief Narrative / Interim history: 80 year old F with PMH of dementia, recent vertebral osteomyelitis (completed antibiotics on 09/08/2020), multiple decubitus ulcer, DVT/PE on Eliquis, OSA, HTN and HLD brought to ED due to failure to thrive and admitted for hypernatremia, infected decubitus ulcer and failure to thrive.  Reportedly failed outpatient treatment with p.o. antibiotics.  She was treated with IV antibiotics and hypotonic IV fluid.   Hospital course complicated by acute hypoxemic respiratory failure thought to be due to aspiration in the setting of acute metabolic encephalopathy requiring transfer to ICU/stepdown for close monitoring.  Eventually weaned off oxygen.  SLP recommended full liquid diet.  CODE STATUS changed to DNR/DNI per discussion between previous attending and family. Main issue is poor p.o. intake.  She is currently on dysphagia 3 diet but p.o. intake remains poor.  IR to place PEG tube.   Subjective: Seen and examined earlier this morning.  No major events overnight of this morning.  No complaints but not a reliable historian.  P.o. intake remains poor.  CBG in upper 60s despite D5 infusion Objective: Vitals:   10/29/20 0400 10/29/20 1334 10/29/20 2030 10/30/20 0545  BP: 114/60 (!) 110/55 (!) 142/66 (!) 109/52  Pulse: 60 74 79 (!) 57  Resp:  18 16 14   Temp: 98.9 F (37.2 C) 98.8 F (37.1 C) 97.7 F (36.5 C) 98.4 F (36.9 C)  TempSrc: Oral Oral Axillary Oral  SpO2: 96% 96% 100% 94%  Weight:      Height:        Intake/Output Summary (Last 24 hours) at 10/30/2020 1242 Last data filed at 10/30/2020 0811 Gross per 24 hour  Intake 241.37 ml  Output 550 ml  Net -308.63 ml   Filed Weights   10/19/20 2107  Weight: 61.3 kg     Examination:   GENERAL: No apparent distress.  Nontoxic. HEENT: MMM.  Vision and hearing grossly intact.  NECK: Supple.  No apparent JVD.  RESP: 94% on RA.  No IWOB.  Fair aeration but limited exam. CVS:  RRR. Heart sounds normal.  ABD/GI/GU: BS+. Abd soft, NTND.  MSK/EXT:  Moves extremities. No apparent deformity.  Trace edema in lower extremities. SKIN: Pressure skin injuries over upper lumbar area, sacral and left buttock.  See picture in media section. NEURO: Awake, alert and oriented fairly.  No apparent focal neuro deficit but generalized weakness. PSYCH: Calm. Normal affect.   Procedures:  3/21-IR to place PEG tube  Microbiology summarized: 3/10-COVID-19 PCR nonreactive 3/10-blood culture with Staphylococcus auricularis in aerobic bottles from single set 3/15-repeat blood cultures NGTD  Assessment & Plan: Infected multiple decubitus ulcer, POA -CT of the abdomen pelvis does not show any acute osteomyelitis at this time. 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 -Initial blood culture contaminated with staph auricularis in aerobic bottle.  Repeat blood culture NGTD -Vanco and cefepime 3/10-3/16>> p.o. Amoxil 3/16>> for 4 weeks. -Outpatient follow-up with ID in 4 weeks. -IR to place PEG tube today.  RD will initiate tube feed when PEG is ready to be used  Acute hypoxic respiratory failure 2/2 aspiration event, resolving -CTA chest on 3/12 with complete collapse  of RLL with large debris's in LLL bronchus  intermedius -Continue aspiration precautions -Antibiotics as above.  Acute metabolic encephalopathy: Seems to be at baseline Chronic dementia without behavioral disturbance -Oriented to self, place and person -Discontinued Aricept in the setting of bradycardia -Continue  Namenda -Reorientation and delirium precautions  Sinus bradycardia: Resolved after stopping Aricept. -Continue holding Aricept -Replenish electrolytes  Hypovolemic hypernatremia: Likely due to poor p.o. intake.  Resolved. -Continue monitoring -Encourage p.o. intake pending PEG tube  Hypokalemia/hypophosphatemia/hypomagnesemia: K3.7.  T2.2. -IV potassium phosphate 30 mmol x 1 today  Essential hypertension: Normotensive. 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  No report of melena or hematochezia.  She is on anticoagulation.  Anemia panel consistent with anemia of chronic disease.  Could be due to poor nutrition Recent Labs    10/21/20 0553 10/23/20 0912 10/24/20 0302 10/25/20 0458 10/25/20 1626 10/26/20 0606 10/27/20 0303 10/28/20 0331 10/29/20 0330 10/30/20 0100  HGB 10.6* 12.3 9.5* 8.2* 8.7* 9.0* 9.2* 8.7* 7.9* 7.2*  -Hold anticoagulation -Check FOBT.  Thrombocytopenia: Resolved. Recent Labs  Lab 10/24/20 0302 10/25/20 0458 10/25/20 1626 10/26/20 0606 10/29/20 0330 10/30/20 0100  PLT 102* 140* 150 159 163 171   Recurrent hypoglycemia: Likely due to poor p.o. intake.  Not on insulin.  CBG in 60s. -Continue D5-1/2NS -RD to start TF after PEG tube placement when PEG is ready to be used  Goal of care: DNR/DNI per discussion between previous provider and patient's family.  Family wants PEG tube placed to optimize nutrition and wound healing. -Proceeding with PEG tube placement. -Needs outpatient palliative care follow-up  Failure to thrive in the setting of poor oral intake -Upgraded to dysphagia-3 diet but caloric intake remains poor -IR to place PEG tube today, and RD to initiate TF when PEG is ready to be used Body mass index is 24.72 kg/m. Nutrition Problem:  Increased nutrient needs Etiology: acute illness,wound healing Signs/Symptoms: estimated needs Interventions: Tube feeding,Prostat,Juven   Pressure skin injury: POA Pressure Injury 10/19/20 Vertebral column Left;Mid Unstageable - Full thickness tissue loss in which the base of the injury is covered by slough (yellow, tan, gray, green or brown) and/or eschar (tan, brown or black) in the wound bed. eschar d/t removal  (Active)  10/19/20 2020  Location: Vertebral column  Location Orientation: Left;Mid  Staging: Unstageable - Full thickness tissue loss in which the base of the injury is covered by slough (yellow, tan, gray, green or brown) and/or eschar (tan, brown or black) in the wound bed.  Wound Description (Comments): eschar d/t removal of medical equipment which became infected (6x5)  Present on Admission: Yes     Pressure Injury 10/19/20 Buttocks Left Stage 4 - Full thickness tissue loss with exposed bone, tendon or muscle. 7x2x3, tunneling at 12'oclock-6cm, 9'oclock-1.5cm. (Active)  10/19/20 2020  Location: Buttocks  Location Orientation: Left  Staging: Stage 4 - Full thickness tissue loss with exposed bone, tendon or muscle.  Wound Description (Comments): 7x2x3, tunneling at 12'oclock-6cm, 9'oclock-1.5cm.  Present on Admission: Yes     Pressure Injury 10/19/20 Buttocks Mid Stage 3 -  Full thickness tissue loss. Subcutaneous fat may be visible but bone, tendon or muscle are NOT exposed. intragluteal cluster, 8.5x5 (Active)  10/19/20 2020  Location: Buttocks  Location Orientation: Mid  Staging: Stage 3 -  Full thickness tissue  loss. Subcutaneous fat may be visible but bone, tendon or muscle are NOT exposed.  Wound Description (Comments): intragluteal cluster, 8.5x5  Present on Admission: Yes   DVT prophylaxis:  On IV heparin for VTE treatment  Code Status: DNR/DNI Family Communication: Updated patient's son over the phone on 3/19. Level of care: Telemetry Status is:  Inpatient  Remains inpatient appropriate because:Ongoing diagnostic testing needed not appropriate for outpatient work up, Inpatient level of care appropriate due to severity of illness and PEG tube placement.   Dispo: The patient is from: Home              Anticipated d/c is to: Home              Patient currently is not medically stable to d/c.   Difficult to place patient No       Consultants:  Infectious disease Interventional radiology   Sch Meds:  Scheduled Meds: . amoxicillin  500 mg Oral Q8H  . vitamin C  1,000 mg Oral Daily  . busPIRone  15 mg Oral BID  . Chlorhexidine Gluconate Cloth  6 each Topical Daily  . collagenase   Topical Daily  . dronabinol  2.5 mg Oral QHS  . feeding supplement  1 Container Oral BID BM  . feeding supplement (PROSource TF)  45 mL Per Tube BID  . ferrous sulfate  325 mg Oral Q breakfast  . lidocaine      . linaclotide  145 mcg Oral Daily  . mouth rinse  15 mL Mouth Rinse BID  . memantine  28 mg Oral QHS  . multivitamin with minerals  1 tablet Oral Daily  . nutrition supplement (JUVEN)  1 packet Per Tube BID BM  . sodium chloride flush  10-40 mL Intracatheter Q12H   Continuous Infusions: . sodium chloride 10 mL/hr at 10/25/20 0451  .  ceFAZolin (ANCEF) IV    . dextrose 5 % and 0.45% NaCl 60 mL/hr at 10/30/20 0800  . [START ON 10/31/2020] feeding supplement (OSMOLITE 1.5 CAL)    . potassium PHOSPHATE IVPB (in mmol) 30 mmol (10/30/20 1048)   PRN Meds:.sodium chloride, acetaminophen, nitroGLYCERIN, oxyCODONE, sodium chloride flush  Antimicrobials: Anti-infectives (From admission, onward)   Start     Dose/Rate Route Frequency Ordered Stop   10/30/20 1200  ceFAZolin (ANCEF) IVPB 2g/100 mL premix        2 g 200 mL/hr over 30 Minutes Intravenous On call to O.R. 10/29/20 1604 10/31/20 0559   10/26/20 0930  amoxicillin (AMOXIL) capsule 500 mg        500 mg Oral Every 8 hours 10/26/20 0833 11/23/20 0559   10/21/20 2200  vancomycin  (VANCOREADY) IVPB 750 mg/150 mL  Status:  Discontinued        750 mg 150 mL/hr over 60 Minutes Intravenous Every 24 hours 10/21/20 0739 10/26/20 0833   10/20/20 2200  vancomycin (VANCOREADY) IVPB 1250 mg/250 mL  Status:  Discontinued        1,250 mg 166.7 mL/hr over 90 Minutes Intravenous Every 24 hours 10/19/20 2253 10/21/20 0739   10/19/20 2315  ceFEPIme (MAXIPIME) 2 g in sodium chloride 0.9 % 100 mL IVPB  Status:  Discontinued        2 g 200 mL/hr over 30 Minutes Intravenous Every 12 hours 10/19/20 2226 10/26/20 0833   10/19/20 2300  vancomycin (VANCOREADY) IVPB 1000 mg/200 mL        1,000 mg 200 mL/hr over 60 Minutes Intravenous  Once 10/19/20 2209  10/20/20 0135   10/19/20 2300  ceFEPIme (MAXIPIME) 2 g in sodium chloride 0.9 % 100 mL IVPB  Status:  Discontinued        2 g 200 mL/hr over 30 Minutes Intravenous  Once 10/19/20 2209 10/19/20 2226   10/19/20 1615  ceFAZolin (ANCEF) IVPB 1 g/50 mL premix        1 g 100 mL/hr over 30 Minutes Intravenous  Once 10/19/20 1614 10/19/20 1931       I have personally reviewed the following labs and images: CBC: Recent Labs  Lab 10/25/20 0458 10/25/20 1626 10/26/20 0606 10/27/20 0303 10/28/20 0331 10/29/20 0330 10/30/20 0100  WBC 7.9 7.9 9.2  --   --  8.6 9.7  HGB 8.2* 8.7* 9.0* 9.2* 8.7* 7.9* 7.2*  HCT 27.1* 28.7* 29.6* 29.7* 28.4* 25.7* 23.4*  MCV 103.0* 102.5* 102.1*  --   --  104.5* 103.5*  PLT 140* 150 159  --   --  163 171   BMP &GFR Recent Labs  Lab 10/26/20 0606 10/27/20 0303 10/28/20 0331 10/29/20 0330 10/30/20 0100  NA 139 141 140 142 140  K 4.0 3.0* 3.6 3.9 3.7  CL 113* 111 112* 114* 114*  CO2 20* 22 21* 22 23  GLUCOSE 122* 77 74 81 78  BUN 9 8 7* 18 16  CREATININE 0.39* 0.37* 0.48 0.42* 0.43*  CALCIUM 8.5* 8.8* 8.6* 8.6* 8.4*  MG 1.8 1.8 2.1 2.0 2.0  PHOS 2.7 2.1* 3.3 2.6 2.2*   Estimated Creatinine Clearance: 48.3 mL/min (A) (by C-G formula based on SCr of 0.43 mg/dL (L)). Liver & Pancreas: Recent Labs   Lab 10/24/20 0302 10/25/20 0458 10/25/20 1626 10/26/20 0606 10/27/20 0303 10/28/20 0331 10/29/20 0330 10/30/20 0100  AST 20 17  --   --   --   --   --   --   ALT 15 15  --   --   --   --   --   --   ALKPHOS 123 109  --   --   --   --   --   --   BILITOT 1.1 0.7  --   --   --   --   --   --   PROT 5.4* 5.0*  --   --   --   --   --   --   ALBUMIN 1.9* 1.8*   < > 1.8* 1.8* 1.8* 1.6* 1.5*   < > = values in this interval not displayed.   No results for input(s): LIPASE, AMYLASE in the last 168 hours. No results for input(s): AMMONIA in the last 168 hours. Diabetic: No results for input(s): HGBA1C in the last 72 hours. Recent Labs  Lab 10/23/20 2350 10/24/20 0105 10/24/20 0106 10/30/20 0740 10/30/20 0830  GLUCAP 63* 88 84 64* 69*   Cardiac Enzymes: No results for input(s): CKTOTAL, CKMB, CKMBINDEX, TROPONINI in the last 168 hours. No results for input(s): PROBNP in the last 8760 hours. Coagulation Profile: Recent Labs  Lab 10/30/20 0100  INR 1.4*   Thyroid Function Tests: No results for input(s): TSH, T4TOTAL, FREET4, T3FREE, THYROIDAB in the last 72 hours. Lipid Profile: No results for input(s): CHOL, HDL, LDLCALC, TRIG, CHOLHDL, LDLDIRECT in the last 72 hours. Anemia Panel: Recent Labs    10/29/20 1441  VITAMINB12 671  FOLATE 7.6  FERRITIN 153  TIBC 92*  IRON 52  RETICCTPCT 3.2*   Urine analysis:    Component Value Date/Time  COLORURINE STRAW (A) 10/19/2020 1942   APPEARANCEUR CLEAR 10/19/2020 1942   LABSPEC 1.021 10/19/2020 1942   PHURINE 9.0 (H) 10/19/2020 1942   GLUCOSEU NEGATIVE 10/19/2020 1942   HGBUR NEGATIVE 10/19/2020 1942   BILIRUBINUR NEGATIVE 10/19/2020 1942   KETONESUR NEGATIVE 10/19/2020 1942   PROTEINUR NEGATIVE 10/19/2020 1942   UROBILINOGEN 1.0 02/14/2012 1054   NITRITE NEGATIVE 10/19/2020 1942   LEUKOCYTESUR NEGATIVE 10/19/2020 1942   Sepsis Labs: Invalid input(s): PROCALCITONIN, LACTICIDVEN  Microbiology: Recent Results (from  the past 240 hour(s))  MRSA PCR Screening     Status: Abnormal   Collection Time: 10/21/20  4:15 PM   Specimen: Nasopharyngeal  Result Value Ref Range Status   MRSA by PCR POSITIVE (A) NEGATIVE Final    Comment:        The GeneXpert MRSA Assay (FDA approved for NASAL specimens only), is one component of a comprehensive MRSA colonization surveillance program. It is not intended to diagnose MRSA infection nor to guide or monitor treatment for MRSA infections. RESULT CALLED TO, READ BACK BY AND VERIFIED WITH: MIKAYLA HAILEY RN 10/21/20 @1902  BY P.HENDERSON Performed at [facility], [address], [place], [place]   Culture, blood (routine x 2)     Status: None (Preliminary result)   Collection Time: 10/24/20  3:02 AM   Specimen: BLOOD  Result Value Ref Range Status   Specimen Description   Final    BLOOD BLOOD LEFT HAND Performed at [facility], [address] [place], [place]    Special Requests   Final    BOTTLES DRAWN AEROBIC ONLY Blood Culture adequate volume Performed at [facility], [address] [place], [place]    Culture   Final    NO GROWTH 4 DAYS Performed at [facility], [address], [place], [place]    Report Status PENDING  Incomplete  Culture, blood (routine x 2)     Status: None (Preliminary result)   Collection Time: 10/24/20  3:02 AM   Specimen: BLOOD  Result Value Ref Range Status   Specimen Description   Final    BLOOD BLOOD RIGHT HAND Performed at [facility], [address] [place], [place]    Special Requests   Final    BOTTLES DRAWN AEROBIC ONLY Blood Culture adequate volume Performed at [facility], [address], [place], [place]    Culture   Final    NO GROWTH 4 DAYS Performed at [facility], [address], [place], [place]    Report Status PENDING   Incomplete    Radiology Studies: No results found.    Taye T. Gonfa [facility]  If 7PM-7AM, please contact night-coverage [email or web address] 10/30/2020, 12:42 PM

## 2020-10-30 NOTE — Procedures (Signed)
Interventional Radiology Procedure Note  Procedure: FLUORO 20 FR GTBE    Complications: None  Estimated Blood Loss:  MIN  Findings: CONFIRMED IN THE STOMACH    M. Ruel Favors, MD

## 2020-10-30 NOTE — Progress Notes (Signed)
SLP Cancellation Note  Patient Details Name: [name] MRN: [id number] DOB: April 14, 1941   Cancelled treatment:       Reason Eval/Treat Not Completed: Other (comment) (NPO for PEG placement)  Angela Nevin, MA, CCC-SLP Speech Therapy

## 2020-10-30 NOTE — Progress Notes (Signed)
Nutrition Follow-up  DOCUMENTATION CODES:   Not applicable  INTERVENTION:  - will order TF: Osmolite 1.5 @ 20 ml/hr to advance by 10 ml every 8 hours to reach goal rate of 50 ml/hr with 45 ml Prosource TF BID, 1 packet Juven BID, and 100 ml free water TID. - at goal rate, this regimen will provide 2070 kcal, 102 grams protein, and 1214 ml free water.   Monitor magnesium, potassium, and phosphorus daily for at least 3 days, MD to replete as needed, as pt is at risk for refeeding syndrome given very poor oral intake for at least 10 days.   NUTRITION DIAGNOSIS:   Increased nutrient needs related to acute illness,wound healing as evidenced by estimated needs. -ongoing  GOAL:   Patient will meet greater than or equal to 90% of their needs -to be met with TF regimen  MONITOR:   TF tolerance,Diet advancement,Labs,Weight trends,Skin  REASON FOR ASSESSMENT:   Consult Enteral/tube feeding initiation and management  ASSESSMENT:   80 year old female with medical history of dementia, HTN, HLD, recently dx of osteomyelitis and hardware infection of the thoracolumbar area, OSA, and multiple decubitus ulcers. She presented to the ED from home due to FTT with minimal PO intake recently. In the ED she was severely hypernatremic. She was a Rapid Response on 3/12 due to likely aspiration event. She was made DNR on 10/22/20. Family had requested PEG placement but due to patient's mental status, baseline, and long-term prognosis it is unclear that a PEG tube would be appropriate.  Significant Events: 3/10- admission 3/12- diet changed from [place] to NPO 3/14- diet re-advanced to [place] 3/15- initial RD assessment 3/17- 48 hour Calorie Count initiation; diet advanced to Dysphagia 3, thin liquids 3/20- NPO resumed at 1605   The only documented intakes since Calorie Count follow-up 3/18 afternoon are 0% of breakfast on 3/19 and 10% of breakfast on 3/20.  Patient is out of the room to IR for planned PEG  placement. MD receptive to RD placing TF orders to begin tomorrow (3/22).   She has not been weighed since admission on 3/10. Deep pitting edema to BUE and moderate pitting edema to BLE documented in the edema section of flow sheet.   She is noted to be a/o to self and place; disoriented to time and situation.    Labs reviewed; CBGs: 64 and 69 mg/dl, Cl: 114 mmol/l, creatinine: 0.43 mg/dl, Ca: 8.4 mg/dl, Phos: 2.2 mg/dl. Medications reviewed; 1000 mg ascorbic acid/day, 2.5 mg marinol/day, 1 tablet multivitamin with minerals/day, , 30 mmol IV KPhos x1 run 3/21. IVF; D5-1/2 NS @ 60 ml/hr (245 kcal/24 hours).   Diet Order:   Diet Order            Diet NPO time specified  Diet effective midnight                 EDUCATION NEEDS:   Not appropriate for education at this time  Skin:  Skin Assessment: Skin Integrity Issues: Skin Integrity Issues:: Stage III,Stage IV,Unstageable Stage III: mid buttocks Stage IV: L buttocks Unstageable: full thickness to vertebral column  Last BM:  3/19 (type 6 x1)  Height:   Ht Readings from Last 1 Encounters:  10/19/20 $RemoveB'5\' 2"'[id number]$  (1.575 m)    Weight:   Wt Readings from Last 1 Encounters:  10/19/20 61.3 kg     Estimated Nutritional Needs:  Kcal:  1850-2050 kcal Protein:  95-110 grams Fluid:  >/= 2.2 L/day  Maleaha Hughett, MS, RD, LDN, CNSC Inpatient Clinical Dietitian RD pager # available in AMION  After hours/weekend pager # available in AMION  

## 2020-10-30 NOTE — Progress Notes (Signed)
Pt off unit to sx at this time.

## 2020-10-30 NOTE — Care Management Important Message (Signed)
Important Message  Patient Details IM Letter given to the Patient. Name: [name] MRN: [id number] Date of Birth: 1941-06-12   Medicare Important Message Given:  Yes     Caren Macadam 10/30/2020, 2:18 PM

## 2020-10-31 ENCOUNTER — Inpatient Hospital Stay (HOSPITAL_COMMUNITY): Payer: Medicare Other

## 2020-10-31 ENCOUNTER — Encounter (HOSPITAL_COMMUNITY): Payer: Self-pay | Admitting: Internal Medicine

## 2020-10-31 DIAGNOSIS — Z7189 Other specified counseling: Secondary | ICD-10-CM

## 2020-10-31 LAB — RENAL FUNCTION PANEL
Albumin: 1.7 g/dL — ABNORMAL LOW (ref 3.5–5.0)
Anion gap: 5 (ref 5–15)
BUN: 11 mg/dL (ref 8–23)
CO2: 23 mmol/L (ref 22–32)
Calcium: 8.2 mg/dL — ABNORMAL LOW (ref 8.9–10.3)
Chloride: 110 mmol/L (ref 98–111)
Creatinine, Ser: 0.31 mg/dL — ABNORMAL LOW (ref 0.44–1.00)
GFR, Estimated: >60 mL/min (ref >60)
Glucose, Bld: 90 mg/dL (ref 70–99)
Phosphorus: 3.1 mg/dL (ref 2.5–4.6)
Potassium: 3 mmol/L — ABNORMAL LOW (ref 3.5–5.1)
Sodium: 138 mmol/L (ref 135–145)

## 2020-10-31 LAB — CBC
HCT: 25.5 % — ABNORMAL LOW (ref 36.0–46.0)
Hemoglobin: 7.8 g/dL — ABNORMAL LOW (ref 12.0–15.0)
MCH: 31.2 pg (ref 26.0–34.0)
MCHC: 30.6 g/dL (ref 30.0–36.0)
MCV: 102 fL — ABNORMAL HIGH (ref 80.0–100.0)
Platelets: 172 10*3/uL (ref 150–400)
RBC: 2.5 MIL/uL — ABNORMAL LOW (ref 3.87–5.11)
RDW: 17.5 % — ABNORMAL HIGH (ref 11.5–15.5)
WBC: 9.5 10*3/uL (ref 4.0–10.5)
nRBC: 0 % (ref 0.0–0.2)

## 2020-10-31 LAB — CULTURE, BLOOD (ROUTINE X 2)
Culture: NO GROWTH
Culture: NO GROWTH
Special Requests: ADEQUATE
Special Requests: ADEQUATE

## 2020-10-31 LAB — GLUCOSE, CAPILLARY: Glucose-Capillary: 128 mg/dL — ABNORMAL HIGH (ref 70–99)

## 2020-10-31 LAB — APTT: aPTT: 54 seconds — ABNORMAL HIGH (ref 24–36)

## 2020-10-31 LAB — MAGNESIUM: Magnesium: 1.8 mg/dL (ref 1.7–2.4)

## 2020-10-31 LAB — HEPARIN LEVEL (UNFRACTIONATED)
Heparin Unfractionated: 0.58 IU/mL (ref 0.30–0.70)
Heparin Unfractionated: 0.42 IU/mL (ref 0.30–0.70)

## 2020-10-31 MED ORDER — POTASSIUM CHLORIDE CRYS ER 20 MEQ PO TBCR
40.0000 meq | EXTENDED_RELEASE_TABLET | ORAL | Status: AC
  Administered 2020-10-31: 40 meq via ORAL
  Filled 2020-10-31 (×2): qty 2

## 2020-10-31 MED ORDER — MAGNESIUM SULFATE 2 GM/50ML IV SOLN
2.0000 g | Freq: Once | INTRAVENOUS | Status: AC
  Administered 2020-10-31: 2 g via INTRAVENOUS
  Filled 2020-10-31: qty 50

## 2020-10-31 MED ORDER — POTASSIUM CHLORIDE 20 MEQ PO PACK: 40.0000 meq | PACK | ORAL | Status: DC

## 2020-10-31 NOTE — Progress Notes (Signed)
PROGRESS NOTE  [name] DVV:[id number] DOB: 1941-03-11   PCP: [name], MD  Patient is from: Home.  Bedbound and dependent for all ADLs at baseline.  DOA: 10/19/2020 LOS: 12  Chief complaints: Wound infection  Brief Narrative / Interim history: 80 year old F with PMH of dementia, recent vertebral osteomyelitis (completed antibiotics on 09/08/2020), multiple decubitus ulcer, DVT/PE on Eliquis, OSA, HTN and HLD brought to ED due to failure to thrive and admitted for hypernatremia, infected decubitus ulcer and failure to thrive.  Reportedly failed outpatient treatment with p.o. antibiotics.  She was treated with IV antibiotics and hypotonic IV fluid.   Hospital course complicated by ICU/stepdown stay for acute hypoxemic RF due to aspiration pneumonia in the setting of acute metabolic encephalopathy. CODE STATUS changed to DNR/DNI per discussion between previous attending and family.  Received 7 days of Vanco and cefepime.  Respiratory failure and encephalopathy resolved.  Antibiotics de-escalating to p.o. amoxicillin for her chronic thoracolumbar osteomyelitis.  Upgraded to dysphagia 3 diet by SLP but p.o. intake remained poor.  PEG tube placed on 3/21.   Thoracolumbar wound seems to be worse with purulent drainage.  Superficial wound culture and repeat CT ordered.   Subjective: Seen and examined earlier this morning.  She was persistently bradycardic to 40s overnight. However, HR improved to 56 after she was turned in bed for skin/wound exam.  No complaints but not a reliable historian.  Does not appear to be in distress   Objective: Vitals:   10/31/20 0024 10/31/20 0225 10/31/20 0536 10/31/20 0955  BP: (!) 117/47 (!) 120/52 (!) 119/47 (!) 132/57  Pulse: (!) 52 (!) 45 (!) 44 (!) 48  Resp: 12 16 12 14   Temp: 98.1 F (36.7 C) 98 F (36.7 C) 97.6 F (36.4 C) 98.1 F (36.7 C)  TempSrc: Oral Oral Oral Oral  SpO2: 96% 98% 100% 97%  Weight:      Height:         Intake/Output Summary (Last 24 hours) at 10/31/2020 1229 Last data filed at 10/31/2020 0600 Gross per 24 hour  Intake 1307.09 ml  Output 1000 ml  Net 307.09 ml   Filed Weights   10/19/20 2107  Weight: 61.3 kg    Examination:  GENERAL: No apparent distress.  Nontoxic. HEENT: MMM.  Vision and hearing grossly intact.  NECK: Supple.  No apparent JVD.  RESP: On RA.  No IWOB.  Fair aeration bilaterally. CVS:  RRR. Heart sounds normal.  ABD/GI/GU: BS+. Abd soft, NTND.  MSK/EXT:  Moves extremities. No apparent deformity. No edema.  SKIN: chronic wound over thoracolumbar wound with pocket of purulent drainage. Coccygeal and left buttock wounds appear to be clean without drainage of surrounding skin induration. See pictures for more NEURO: Awake and alert.  Oriented to self, place and months.  No apparent focal neuro deficit. PSYCH: Calm. Normal affect.            Procedures:  3/21-IR to place PEG tube  Microbiology summarized: 3/10-COVID-19 PCR nonreactive 3/10-blood culture with Staphylococcus auricularis in aerobic bottles from single set 3/15-repeat blood cultures NGTD  Assessment & Plan: Infected multiple decubitus ulcer, POA -CT of the abdomen pelvis does not show any acute osteomyelitis at this time. 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 Now with pocket of purulent fluid -Chronic decubitus ulcer over coccyx and left buttock seems stable -Initial blood culture contaminated with staph auricularis in  aerobic bottle.  Repeat blood culture NGTD -Vanco and cefepime 3/10-3/16>> p.o. Amoxil 3/16>> for 4 weeks, and outpt f/up in clinic in 4 weeks per ID. -Follow wound culture from thoracolumbar wound -Follow CT lumbar spine for thoracolumbar wound -Start tube feed  Acute hypoxic respiratory failure 2/2 aspiration event, resolving -CTA chest on  3/12 with complete collapse of RLL with large debris's in LLL bronchus  intermedius -Continue aspiration precautions -Antibiotics as above.  Acute metabolic encephalopathy: Seems to be at baseline Chronic dementia without behavioral disturbance -Oriented to self, place and person -Discontinued Aricept in the setting of bradycardia -Continue Namenda -Reorientation and delirium precautions  Sinus bradycardia: HR persistently 40s last night.  EKG reported of junctional rhythm but seems to have small p-waves. HR improved to 56 when turned in bed for skin exam. -Continue holding Aricept -Replenish electrolytes -Patient is not a candidate for pacemaker  Hypovolemic hypernatremia: Likely due to poor p.o. intake.  Resolved. -Continue monitoring  Hypokalemia/hypophosphatemia/hypomagnesemia: K3.0.  Mg 1.8.  Likely due to poor p.o. intake.  She will be at risk for refeeding syndrome when tube feed starts. -K. Dur 40 mEq x 3 -IV magnesium sulfate 2 g x 1 -Closely monitor K, Mg and P  Essential hypertension: Normotensive. 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 If negative, may resume Eliquis after CT lumbar spine -SCD for VTE prophylaxis  Normocytic anemia: Gradual drop in H&H but seems to be stable now.  No report of melena or hematochezia. Anemia panel consistent with ACD.  Could be due to poor nutrition Recent Labs    10/24/20 0302 10/25/20 0458 10/25/20 1626 10/26/20 0606 10/27/20 0303 10/28/20 0331 10/29/20 0330 10/30/20 0100 10/30/20 1310 10/31/20 0327  HGB 9.5* 8.2* 8.7* 9.0* 9.2* 8.7* 7.9* 7.2* 7.9* 7.8*  -Follow Hemoccult -Continue monitoring -May resume Eliquis after Hemoccult and CT lumbar spine  Thrombocytopenia: Resolved. Recent Labs  Lab 10/25/20 0458 10/25/20 1626 10/26/20 0606 10/29/20 0330 10/30/20 0100 10/31/20 0327  PLT  140* 150 159 163 171 172   Recurrent hypoglycemia: Likely due to poor p.o. intake.  Not on insulin.  BMP glucose 90. -Continue D5-1/2NS -Start TF per RD  Goal of care: DNR/DNI per discussion b/n previous provider and patient's family.  Family wants PEG tube placed to optimize nutrition and wound healing.  Regardless, prognosis remains grim even with TF given progressive thoracolumbar decubitus ulcer which seems to be worse.  I have discussed this with patient's son, Jiles GarterRolando over the phone. I offered him palliative care consult. He asked me to talk to his wife, but she didn't pick up the phone.  -PEG tube placed on 10/30/2020.  Start TF per RD -Consulted palliative care  Failure to thrive in the setting of poor oral intake -Upgraded to dysphagia-3 diet but caloric intake remained poor -PEG tube place. Start TF.  Body mass index is 24.72 kg/m. Nutrition Problem: Increased nutrient needs Etiology: acute illness,wound healing Signs/Symptoms: estimated needs Interventions: Tube feeding,Prostat,Juven   Pressure skin injury: POA Pressure Injury 10/19/20 Vertebral column Left;Mid Unstageable - Full thickness tissue loss in which the base of the injury is covered by slough (yellow, tan, gray, green or brown) and/or eschar (tan, brown or black) in the wound bed. eschar d/t removal  (Active)  10/19/20 2020  Location: Vertebral column  Location Orientation: Left;Mid  Staging: Unstageable - Full thickness tissue loss in which the base of the injury  is covered by slough (yellow, tan, gray, green or brown) and/or eschar (tan, brown or black) in the wound bed.  Wound Description (Comments): eschar d/t removal of medical equipment which became infected (6x5)  Present on Admission: Yes     Pressure Injury 10/19/20 Buttocks Left Stage 4 - Full thickness tissue loss with exposed bone, tendon or muscle. 7x2x3, tunneling at 12'oclock-6cm, 9'oclock-1.5cm. (Active)  10/19/20 2020  Location: Buttocks  Location  Orientation: Left  Staging: Stage 4 - Full thickness tissue loss with exposed bone, tendon or muscle.  Wound Description (Comments): 7x2x3, tunneling at 12'oclock-6cm, 9'oclock-1.5cm.  Present on Admission: Yes     Pressure Injury 10/19/20 Buttocks Mid Stage 3 -  Full thickness tissue loss. Subcutaneous fat may be visible but bone, tendon or muscle are NOT exposed. intragluteal cluster, 8.5x5 (Active)  10/19/20 2020  Location: Buttocks  Location Orientation: Mid  Staging: Stage 3 -  Full thickness tissue loss. Subcutaneous fat may be visible but bone, tendon or muscle are NOT exposed.  Wound Description (Comments): intragluteal cluster, 8.5x5  Present on Admission: Yes   DVT prophylaxis:  On IV heparin for VTE treatment  Code Status: DNR/DNI Family Communication: Updated patient's son over the phone. Daughter-in-law didn't answer the phone Level of care: Telemetry Status is: Inpatient  Remains inpatient appropriate because:Hemodynamically unstable, Persistent severe electrolyte disturbances, Ongoing diagnostic testing needed not appropriate for outpatient work up, Inpatient level of care appropriate due to severity of illness and monitoring for refeeding syndrome.   Dispo: The patient is from: Home              Anticipated d/c is to: Home              Patient currently is not medically stable to d/c.   Difficult to place patient No       Consultants:  Infectious disease Interventional radiology Palliative medicine   Sch Meds:  Scheduled Meds: . amoxicillin  500 mg Oral Q8H  . vitamin C  1,000 mg Oral Daily  . busPIRone  15 mg Oral BID  . Chlorhexidine Gluconate Cloth  6 each Topical Daily  . collagenase   Topical Daily  . dronabinol  2.5 mg Oral QHS  . feeding supplement  1 Container Oral BID BM  . feeding supplement (PROSource TF)  45 mL Per Tube BID  . ferrous sulfate  325 mg Oral Q breakfast  . linaclotide  145 mcg Oral Daily  . mouth rinse  15 mL Mouth Rinse  BID  . memantine  28 mg Oral QHS  . multivitamin with minerals  1 tablet Oral Daily  . nutrition supplement (JUVEN)  1 packet Per Tube BID BM  . potassium chloride  40 mEq Oral Q4H  . sodium chloride flush  10-40 mL Intracatheter Q12H   Continuous Infusions: . sodium chloride 10 mL/hr at 10/25/20 0451  . dextrose 5 % and 0.45% NaCl 60 mL/hr at 10/31/20 0104  . feeding supplement (OSMOLITE 1.5 CAL)    . heparin 650 Units/hr (10/31/20 0253)   PRN Meds:.sodium chloride, acetaminophen, nitroGLYCERIN, oxyCODONE, sodium chloride flush  Antimicrobials: Anti-infectives (From admission, onward)   Start     Dose/Rate Route Frequency Ordered Stop   10/30/20 1200  ceFAZolin (ANCEF) IVPB 2g/100 mL premix        2 g 200 mL/hr over 30 Minutes Intravenous On call to O.R. 10/29/20 1604 10/31/20 0824   10/26/20 0930  amoxicillin (AMOXIL) capsule 500 mg  500 mg Oral Every 8 hours 10/26/20 0833 11/23/20 0559   10/21/20 2200  vancomycin (VANCOREADY) IVPB 750 mg/150 mL  Status:  Discontinued        750 mg 150 mL/hr over 60 Minutes Intravenous Every 24 hours 10/21/20 0739 10/26/20 0833   10/20/20 2200  vancomycin (VANCOREADY) IVPB 1250 mg/250 mL  Status:  Discontinued        1,250 mg 166.7 mL/hr over 90 Minutes Intravenous Every 24 hours 10/19/20 2253 10/21/20 0739   10/19/20 2315  ceFEPIme (MAXIPIME) 2 g in sodium chloride 0.9 % 100 mL IVPB  Status:  Discontinued        2 g 200 mL/hr over 30 Minutes Intravenous Every 12 hours 10/19/20 2226 10/26/20 0833   10/19/20 2300  vancomycin (VANCOREADY) IVPB 1000 mg/200 mL        1,000 mg 200 mL/hr over 60 Minutes Intravenous  Once 10/19/20 2209 10/20/20 0135   10/19/20 2300  ceFEPIme (MAXIPIME) 2 g in sodium chloride 0.9 % 100 mL IVPB  Status:  Discontinued        2 g 200 mL/hr over 30 Minutes Intravenous  Once 10/19/20 2209 10/19/20 2226   10/19/20 1615  ceFAZolin (ANCEF) IVPB 1 g/50 mL premix        1 g 100 mL/hr over 30 Minutes Intravenous  Once  10/19/20 1614 10/19/20 1931       I have personally reviewed the following labs and images: CBC: Recent Labs  Lab 10/25/20 1626 10/26/20 0606 10/27/20 0303 10/28/20 0331 10/29/20 0330 10/30/20 0100 10/30/20 1310 10/31/20 0327  WBC 7.9 9.2  --   --  8.6 9.7  --  9.5  HGB 8.7* 9.0*   < > 8.7* 7.9* 7.2* 7.9* 7.8*  HCT 28.7* 29.6*   < > 28.4* 25.7* 23.4* 26.6* 25.5*  MCV 102.5* 102.1*  --   --  104.5* 103.5*  --  102.0*  PLT 150 159  --   --  163 171  --  172   < > = values in this interval not displayed.   BMP &GFR Recent Labs  Lab 10/27/20 0303 10/28/20 0331 10/29/20 0330 10/30/20 0100 10/31/20 0327  NA 141 140 142 140 138  K 3.0* 3.6 3.9 3.7 3.0*  CL 111 112* 114* 114* 110  CO2 22 21* 22 23 23   GLUCOSE 77 74 81 78 90  BUN 8 7* 18 16 11   CREATININE 0.37* 0.48 0.42* 0.43* 0.31*  CALCIUM 8.8* 8.6* 8.6* 8.4* 8.2*  MG 1.8 2.1 2.0 2.0 1.8  PHOS 2.1* 3.3 2.6 2.2* 3.1   Estimated Creatinine Clearance: 48.3 mL/min (A) (by C-G formula based on SCr of 0.31 mg/dL (L)). Liver & Pancreas: Recent Labs  Lab 10/25/20 0458 10/25/20 1626 10/27/20 0303 10/28/20 0331 10/29/20 0330 10/30/20 0100 10/31/20 0327  AST 17  --   --   --   --   --   --   ALT 15  --   --   --   --   --   --   ALKPHOS 109  --   --   --   --   --   --   BILITOT 0.7  --   --   --   --   --   --   PROT 5.0*  --   --   --   --   --   --   ALBUMIN 1.8*   < > 1.8* 1.8* 1.6* 1.5* 1.7*   < > =  values in this interval not displayed.   No results for input(s): LIPASE, AMYLASE in the last 168 hours. No results for input(s): AMMONIA in the last 168 hours. Diabetic: No results for input(s): HGBA1C in the last 72 hours. Recent Labs  Lab 10/30/20 0740 10/30/20 0830 10/30/20 2100  GLUCAP 64* 69* 92   Cardiac Enzymes: No results for input(s): CKTOTAL, CKMB, CKMBINDEX, TROPONINI in the last 168 hours. No results for input(s): PROBNP in the last 8760 hours. Coagulation Profile: Recent Labs  Lab  10/30/20 0100  INR 1.4*   Thyroid Function Tests: No results for input(s): TSH, T4TOTAL, FREET4, T3FREE, THYROIDAB in the last 72 hours. Lipid Profile: No results for input(s): CHOL, HDL, LDLCALC, TRIG, CHOLHDL, LDLDIRECT in the last 72 hours. Anemia Panel: Recent Labs    10/29/20 1441  VITAMINB12 671  FOLATE 7.6  FERRITIN 153  TIBC 92*  IRON 52  RETICCTPCT 3.2*   Urine analysis:    Component Value Date/Time   COLORURINE STRAW (A) 10/19/2020 1942   APPEARANCEUR CLEAR 10/19/2020 1942   LABSPEC 1.021 10/19/2020 1942   PHURINE 9.0 (H) 10/19/2020 1942   GLUCOSEU NEGATIVE 10/19/2020 1942   HGBUR NEGATIVE 10/19/2020 1942   BILIRUBINUR NEGATIVE 10/19/2020 1942   KETONESUR NEGATIVE 10/19/2020 1942   PROTEINUR NEGATIVE 10/19/2020 1942   UROBILINOGEN 1.0 02/14/2012 1054   NITRITE NEGATIVE 10/19/2020 1942   LEUKOCYTESUR NEGATIVE 10/19/2020 1942   Sepsis Labs: Invalid input(s): PROCALCITONIN, LACTICIDVEN  Microbiology: Recent Results (from the past 240 hour(s))  MRSA PCR Screening     Status: Abnormal   Collection Time: 10/21/20  4:15 PM   Specimen: Nasopharyngeal  Result Value Ref Range Status   MRSA by PCR POSITIVE (A) NEGATIVE Final    Comment:        The GeneXpert MRSA Assay (FDA approved for NASAL specimens only), is one component of a comprehensive MRSA colonization surveillance program. It is not intended to diagnose MRSA infection nor to guide or monitor treatment for MRSA infections. RESULT CALLED TO, READ BACK BY AND VERIFIED WITH: MIKAYLA HAILEY RN 10/21/20 @1902  BY P.HENDERSON Performed at [facility], [address], [place], [place] [address]   Culture, blood (routine x 2)     Status: None (Preliminary result)   Collection Time: 10/24/20  3:02 AM   Specimen: BLOOD  Result Value Ref Range Status   Specimen Description   Final    BLOOD BLOOD LEFT HAND Performed at [facility], [address] [place], [place]  [address]    Special Requests   Final    BOTTLES DRAWN AEROBIC ONLY Blood Culture adequate volume Performed at [facility], [address], [place], [place] [address]    Culture   Final    NO GROWTH 4 DAYS Performed at [facility], [address], [place], [place] [address]    Report Status PENDING  Incomplete  Culture, blood (routine x 2)     Status: None (Preliminary result)   Collection Time: 10/24/20  3:02 AM   Specimen: BLOOD  Result Value Ref Range Status   Specimen Description   Final    BLOOD BLOOD RIGHT HAND Performed at [facility], [address], [place], [place] [address]    Special Requests   Final    BOTTLES DRAWN AEROBIC ONLY Blood Culture adequate volume Performed at [facility], [address], [place], [place] [address]    Culture   Final  NO GROWTH 4 DAYS Performed at [facility], [address], [place], [place] [address]    Report Status PENDING  Incomplete    Radiology Studies: IR GASTROSTOMY TUBE MOD SED  Result Date: 10/30/2020 INDICATION: Malnutrition, poor wound healing EXAM: FLUOROSCOPIC 20 FRENCH PULL-THROUGH GASTROSTOMY Date:  10/30/2020 10/30/2020 1:33 pm Radiologist:  Judie Petit. Ruel Favors, MD Guidance:  Fluoroscopic MEDICATIONS: Ancef 2 g; Antibiotics were administered within 1 hour of the procedure. Glucagon 0.5 mg IV ANESTHESIA/SEDATION: Versed 1.0 mg IV; Fentanyl 50 mcg IV Moderate Sedation Time:  15 minutes The patient was continuously monitored during the procedure by the interventional radiology nurse under my direct supervision. CONTRAST:  32mL OMNIPAQUE IOHEXOL 300 MG/ML SOLN - administered into the gastric lumen. FLUOROSCOPY TIME:  Fluoroscopy Time: 3 minutes 24 seconds (41 mGy). COMPLICATIONS: None immediate. PROCEDURE: Informed consent was obtained from the patient following explanation of the procedure, risks, benefits and alternatives. The patient understands, agrees and  consents for the procedure. All questions were addressed. A time out was performed. Maximal barrier sterile technique utilized including caps, mask, sterile gowns, sterile gloves, large sterile drape, hand hygiene, and betadine prep. The left upper quadrant was sterilely prepped and draped. An oral gastric catheter was inserted into the stomach under fluoroscopy. The existing nasogastric feeding tube was removed. Air was injected into the stomach for insufflation and visualization under fluoroscopy. The air distended stomach was confirmed beneath the anterior abdominal wall in the frontal and lateral projections. Under sterile conditions and local anesthesia, a 17 gauge trocar needle was utilized to access the stomach percutaneously beneath the left subcostal margin. Needle position was confirmed within the stomach under biplane fluoroscopy. Contrast injection confirmed position also. A single T tack was deployed for gastropexy. Over an Amplatz guide wire, a 9-French sheath was inserted into the stomach. A snare device was utilized to capture the oral gastric catheter. The snare device was pulled retrograde from the stomach up the esophagus and out the oropharynx. The 20-French pull-through gastrostomy was connected to the snare device and pulled antegrade through the oropharynx down the esophagus into the stomach and then through the percutaneous tract external to the patient. The gastrostomy was assembled externally. Contrast injection confirms position in the stomach. Images were obtained for documentation. The patient tolerated procedure well. No immediate complication. IMPRESSION: Fluoroscopic insertion of a 20-French "pull-through" gastrostomy. Electronically Signed   By: Judie Petit.  Shick M.D.   On: 10/30/2020 13:47      Taye T. Gonfa [facility]  If 7PM-7AM, please contact night-coverage [email or web address] 10/31/2020, 12:29 PM

## 2020-10-31 NOTE — Progress Notes (Signed)
ANTICOAGULATION CONSULT NOTE  Pharmacy Consult for IV heparin  Indication: bridge therapy while Apixaban for hx DVT/PE on hold for PEG placement  Allergies  Allergen Reactions  . T-Pa [Alteplase] Swelling    Does okay with it if premedicated with benadryl.  . Codeine Itching    Patient Measurements: Height: 5\' 2"  (157.5 cm) Weight: 61.3 kg (135 lb 2.3 oz) IBW/kg (Calculated) : 50.1 Heparin Dosing Weight: actual body weight   Vital Signs: Temp: 98 F (36.7 C) (03/22 0225) Temp Source: Oral (03/22 0024) BP: 120/52 (03/22 0225) Pulse Rate: 45 (03/22 0225)  Labs: Recent Labs    10/28/20 0331 10/28/20 1154 10/29/20 0330 10/29/20 1441 10/30/20 0100 10/30/20 1310 10/31/20 0138  HGB 8.7*  --  7.9*  --  7.2* 7.9*  --   HCT 28.4*  --  25.7*  --  23.4* 26.6*  --   PLT  --   --  163  --  171  --   --   APTT  --    < > 86*   < > 110* 32 54*  LABPROT  --   --   --   --  16.2*  --   --   INR  --   --   --   --  1.4*  --   --   HEPARINUNFRC  --    < > >2.20*  --  1.42*  --  0.58  CREATININE 0.48  --  0.42*  --  0.43*  --   --    < > = values in this interval not displayed.    Estimated Creatinine Clearance: 48.3 mL/min (A) (by C-G formula based on SCr of 0.43 mg/dL (L)).   Medical History: Past Medical History:  Diagnosis Date  . Arthritis   . Asthma   . Chronic back pain   . Depression   . History of pulmonary embolus (PE)   . Hx-TIA (transient ischemic attack)   . Hyperlipemia   . Hypertension   . Personal history of DVT (deep vein thrombosis)   . Scoliosis   . Sleep apnea    cpap - settingsi at 3   . Stroke (HCC)   . 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 Pharmacy consulted to dose IV heparin for bridge therapy while apixaban on hold for IR to place PEG tube. Last dose of Apixaban 3/19 at 9:09 AM. Baseline heparin level > 2.2, as expected due to recent Apixaban. Baseline aPTT 30 seconds.   Today,  10/31/20:  Heparin level = 0.58 therapeutic on 650 units/hr  APTT = 54   CBC: Hgb 7.9, Pltc WNL (3/21)  No bleeding or infusion issues noted per nursing  Goal of Therapy:  Heparin level 0.3-0.7 units/ml aPTT 66-102 seconds Monitor platelets by anticoagulation protocol: Yes   Plan:   continue heparin infusion at 650 units/hr   Check heparin level in 8 hours  Daily CBC, aPTT, heparin level  Monitor closely for s/sx of bleeding  F/u when apixaban can be resumed    04-10-1989 RPh 10/31/2020, 3:22 AM

## 2020-10-31 NOTE — Progress Notes (Signed)
Speech Language Pathology Treatment: Dysphagia  Patient Details Name: AVERYANA PILLARS MRN: [id number] DOB: 08/15/1940 Today's Date: 10/31/2020 Time: 8338-2505 SLP Time Calculation (min) (ACUTE ONLY): 14 min  Assessment / Plan / Recommendation Clinical Impression  Pt known to this SLP from evaluation/treatment five days ago.  Her intake has remains suboptimal and she has received a PEG for nutrition.    Pt alert and friendly lying in bed.  States she is not hungry but when asked re: honey bun, she readily agreed to eat.  Positioning is sub-optimal due to her wounds causing her discomfort - therefore extra caution using during po administration.    SLP provided pt with small bites placed on her right side of her oral cavity.  Prolonged and delayed mastication noted - with left anterior pocketing and adherence to lower canine without pt awareness.  When SLP advised pt to her oral retention, she advised, I didn't know that.  She expectorated honey bun bite x1 per verbal cue after unable to transit with liquids.  2nd bolus was masticated - placed on right and was transited into pharynx with use of icecream and gingerale.  Pt's swallowing ability today clinically judged to be worse than five days ago - ? Due to lack of appetite, progressive decline.  Pt again confirms, she does not want to eat. Reviewed aspiration precautions, dysphagia mitigation strategies with her to maximize her comfort/safety/enjoyment with po intake.  Reviewed Md documentation re: pt's medical progression.    Will follow from afar to provide guidance for family re: comfort po, helpful compensation strategies if goal becomes comfort.  Thanks.    HPI HPI: 80yo female admitted 10/19/20 due to discharge from decubitus ulcer. 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 Pt known to ST services.  Pt underwent PEG placement on 3/21 and follow up re: swallow ability indicated. Per notes, pt's wound is getting worse despite  medical interventions.  Pt is on a dys3/thin diet. Intake has remained poor as she admits she does not enjoy eating - poor appetite and states food is "mush".  SLP reminded pt that her current diet is dys3/thin. She is receiving full nutritional support via PEG tube- which likely decreases her desire to eat even more.      SLP Plan  Other (Comment) (? follow up - if pt becomes comfort care - will follow up with family for education - therefore will follow from afar)       Recommendations  Diet recommendations: Dysphagia 3 (mechanical soft);Thin liquid Liquids provided via: Straw Medication Administration: Whole meds with puree (crush if large and not contraindicated) Supervision: Full supervision/cueing for compensatory strategies;Staff to assist with self feeding Compensations: Minimize environmental distractions;Slow rate;Small sips/bites;Lingual sweep for clearance of pocketing;Other (Comment) (use puree to aid oral transit of masticated solids, have pt expectorate what she can not swallow) Postural Changes and/or Swallow Maneuvers: Seated upright 90 degrees;Upright 30-60 min after meal                Oral Care Recommendations: Oral care QID;Oral care before and after PO;Staff/trained caregiver to provide oral care Follow up Recommendations: 24 hour supervision/assistance SLP Visit Diagnosis: Dysphagia, oropharyngeal phase (R13.12) Plan: Other (Comment) (? follow up - if pt becomes comfort care - will follow up with family for education - therefore will follow from afar)       GO                Chales Abrahams 10/31/2020, 7:31 PM  Rolena Infante, MS [facility] SLP Acute Rehab Services Office [phone] Pager [phone]

## 2020-10-31 NOTE — Progress Notes (Signed)
Pt refused her oral potassium. Pt was educated and refusal was documented. Pt stated " everyone keeps giving me things and messing with me and I'm not any better than when I came. I just want to be left alone."  [name]

## 2020-10-31 NOTE — Progress Notes (Signed)
Earlier in the day, I updated patient's son, [name] about patient's progressive decline.  He requested me to talk to his wife, [name].  At that time, she did not answer the phone. I called back again. I was able to get hold of her. I introduced myself and the purpose of my call.  I updated her about [name]'s progressive decline.  I told her some of her wounds, especially the one on her mid-back looked worse. I told her it may continue to get worse as patient is always in bed on her back not able to tolerate turning.  Her anemia is also getting worse which makes it hard to continue blood thinner for her VTE.  She also have significant bradycardia overnight. I doubt TF would reverse her progressive decline.  I also question if what we are doing is to her best interest. [name] voiced understanding. She thinks she should be kept comfortable and allowed to die peacefully when it is time. She said she told him ([name]) but "he got mad at me. He loves her so much that he couldn't make this decision". [name] is going to talk to [name] again. I have also consulted palliative care to facilitate further goal of care discussion.

## 2020-10-31 NOTE — Progress Notes (Signed)
Patient ID: [name], female   DOB: August 28, 1940, 80 y.o.   MRN: [id number] Pt s/p G tube placement yesterday ; afebrile; hgb 7.8(7.9), K 3.0- replace; creat 0.31; G tube intact, insertion site ok, mildly tender; abd ND; ok to use; rec keeping one layer of drain sponge gauze under disc to prevent skin irritation

## 2020-10-31 NOTE — Plan of Care (Signed)
  Problem: Nutrition: Goal: Adequate nutrition will be maintained Outcome: Progressing   Problem: Skin Integrity: Goal: Risk for impaired skin integrity will decrease Outcome: Progressing   Problem: Clinical Measurements: Goal: Ability to maintain clinical measurements within normal limits will improve Outcome: Progressing

## 2020-10-31 NOTE — Progress Notes (Signed)
ANTICOAGULATION CONSULT NOTE  Pharmacy Consult for IV heparin  Indication: bridge therapy while Apixaban for hx DVT/PE on hold for PEG placement  Allergies  Allergen Reactions  . T-Pa [Alteplase] Swelling    Does okay with it if premedicated with benadryl.  . Codeine Itching    Patient Measurements: Height: 5\' 2"  (157.5 cm) Weight: 61.3 kg (135 lb 2.3 oz) IBW/kg (Calculated) : 50.1 Heparin Dosing Weight: actual body weight   Vital Signs: Temp: 98.1 F (36.7 C) (03/22 0955) Temp Source: Oral (03/22 0955) BP: 132/57 (03/22 0955) Pulse Rate: 48 (03/22 0955)  Labs: Recent Labs    10/29/20 0330 10/29/20 1441 10/30/20 0100 10/30/20 1310 10/31/20 0138 10/31/20 0327 10/31/20 1100  HGB 7.9*  --  7.2* 7.9*  --  7.8*  --   HCT 25.7*  --  23.4* 26.6*  --  25.5*  --   PLT 163  --  171  --   --  172  --   APTT 86*   < > 110* 32 54*  --   --   LABPROT  --   --  16.2*  --   --   --   --   INR  --   --  1.4*  --   --   --   --   HEPARINUNFRC >2.20*  --  1.42*  --  0.58  --  0.42  CREATININE 0.42*  --  0.43*  --   --  0.31*  --    < > = values in this interval not displayed.    Estimated Creatinine Clearance: 48.3 mL/min (A) (by C-G formula based on SCr of 0.31 mg/dL (L)).   Medical History: Past Medical History:  Diagnosis Date  . Arthritis   . Asthma   . Chronic back pain   . Depression   . History of pulmonary embolus (PE)   . Hx-TIA (transient ischemic attack)   . Hyperlipemia   . Hypertension   . Personal history of DVT (deep vein thrombosis)   . Scoliosis   . Sleep apnea    cpap - settingsi at 3   . Stroke (HCC)   . 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 Pharmacy consulted to dose IV heparin for bridge therapy while apixaban on hold for IR to place PEG tube. Last dose of Apixaban 3/19 at 9:09 AM. Baseline heparin level > 2.2, as expected due to recent Apixaban. Baseline aPTT 30 seconds.   Today,  10/31/20:  Heparin level continues to be therapeutic on current IV heparin rate of 650 units/hr  CBC: Hgb 7.8 stable, Pltc WNL  No bleeding or infusion issues noted per nursing  Goal of Therapy:  Heparin level 0.3-0.7 units/ml aPTT 66-102 seconds Monitor platelets by anticoagulation protocol: Yes   Plan:   Continue heparin infusion at 650 units/hr   Daily CBC, heparin level  Monitor closely for s/sx of bleeding  F/u when apixaban can be resumed    11/02/20, PharmD, BCPS 10/31/2020 12:07 PM

## 2020-10-31 NOTE — Progress Notes (Signed)
   10/31/20 0224  ECG Monitoring  CV Strip Heart Rate 38  Cardiac Rhythm SB (N/N Ronal Maybury RN TVR CMT)  Provider Notification  Provider Name/Title Arvilla Market  Date Provider Notified 10/31/20  Time Provider Notified [phone]  Notification Type Page  Notification Reason Other (Comment) (Heart Rate)  Test performed and critical result EKG  Provider response No new orders  Date of Provider Response 10/31/20  Time of Provider Response [phone]

## 2020-11-01 DIAGNOSIS — R0902 Hypoxemia: Secondary | ICD-10-CM

## 2020-11-01 DIAGNOSIS — Z515 Encounter for palliative care: Secondary | ICD-10-CM

## 2020-11-01 DIAGNOSIS — L8990 Pressure ulcer of unspecified site, unspecified stage: Secondary | ICD-10-CM

## 2020-11-01 DIAGNOSIS — Z66 Do not resuscitate: Secondary | ICD-10-CM

## 2020-11-01 LAB — CBC
HCT: 24.4 % — ABNORMAL LOW (ref 36.0–46.0)
Hemoglobin: 7.6 g/dL — ABNORMAL LOW (ref 12.0–15.0)
MCH: 31.8 pg (ref 26.0–34.0)
MCHC: 31.1 g/dL (ref 30.0–36.0)
MCV: 102.1 fL — ABNORMAL HIGH (ref 80.0–100.0)
Platelets: 154 10*3/uL (ref 150–400)
RBC: 2.39 MIL/uL — ABNORMAL LOW (ref 3.87–5.11)
RDW: 17.7 % — ABNORMAL HIGH (ref 11.5–15.5)
WBC: 9.5 10*3/uL (ref 4.0–10.5)
nRBC: 0.2 % (ref 0.0–0.2)

## 2020-11-01 LAB — RENAL FUNCTION PANEL
Albumin: 1.5 g/dL — ABNORMAL LOW (ref 3.5–5.0)
Anion gap: 6 (ref 5–15)
BUN: 10 mg/dL (ref 8–23)
CO2: 24 mmol/L (ref 22–32)
Calcium: 8 mg/dL — ABNORMAL LOW (ref 8.9–10.3)
Chloride: 110 mmol/L (ref 98–111)
Creatinine, Ser: 0.32 mg/dL — ABNORMAL LOW (ref 0.44–1.00)
GFR, Estimated: >60 mL/min (ref >60)
Glucose, Bld: 179 mg/dL — ABNORMAL HIGH (ref 70–99)
Phosphorus: 2.2 mg/dL — ABNORMAL LOW (ref 2.5–4.6)
Potassium: 3.3 mmol/L — ABNORMAL LOW (ref 3.5–5.1)
Sodium: 140 mmol/L (ref 135–145)

## 2020-11-01 LAB — HEPARIN LEVEL (UNFRACTIONATED): Heparin Unfractionated: 0.31 IU/mL (ref 0.30–0.70)

## 2020-11-01 LAB — GLUCOSE, CAPILLARY
Glucose-Capillary: 169 mg/dL — ABNORMAL HIGH (ref 70–99)
Glucose-Capillary: 106 mg/dL — ABNORMAL HIGH (ref 70–99)

## 2020-11-01 LAB — MAGNESIUM: Magnesium: 1.9 mg/dL (ref 1.7–2.4)

## 2020-11-01 MED ORDER — APIXABAN 5 MG PO TABS
5.0000 mg | ORAL_TABLET | Freq: Two times a day (BID) | ORAL | Status: DC
  Administered 2020-11-01 – 2020-11-07 (×13): 5 mg via ORAL
  Filled 2020-11-01 (×13): qty 1

## 2020-11-01 MED ORDER — K PHOS MONO-SOD PHOS DI & MONO 155-852-130 MG PO TABS
500.0000 mg | ORAL_TABLET | Freq: Two times a day (BID) | ORAL | Status: AC
  Administered 2020-11-01 – 2020-11-02 (×3): 500 mg via ORAL
  Filled 2020-11-01 (×4): qty 2

## 2020-11-01 NOTE — Consult Note (Signed)
Consultation Note Date: 11/01/2020   Patient Name: [name]  DOB: [birth year]  MRN: [id number]  Age / Sex: 80 y.o., female   PCP: Nolene Ebbs, MD Referring Physician: Terrilee Croak, MD   REASON FOR CONSULTATION:Establishing goals of care  Palliative Care consult requested for goals of care discussion in this 80 y.o. female with past medical history of lipidemia, hypertension, chronic back pain, CVA, scoliosis, asthma, sleep apnea, Takotsubo cardiomyopathy, multiple decubitus ulcers with osteomyelitis, and dementia.  Patient presented to the ED via EMS from home with concerns of worsening sacral decubitus ulcer.  Of note patient was recently hospitalized December 2021 for sepsis secondary to osteomyelitis and hardware infection of the thoracolumbar area subsequently hardware was removed and patient placed on IV antibiotics until September 08, 2020.  Since admission decubitus ulcers on left buttock with drainage.  CT abdomen pelvis and CT lumbar spine showed chronic degenerative changes versus chronic osteomyelitis changes in T11-12 and 1.  Receiving IV antibiotics.  G-tube placed on 10/29/2020 as requested by son to assist with nutrition in the setting of failure to thrive.  Clinical Assessment and Goals of Care: I have reviewed medical records including lab results, imaging, Epic notes, and Sarah, received report from the bedside RN, and assessed the patient.   I met at the bedside with [name].  Patient is awake alert and oriented to questions.  Denies pain or shortness of breath.  No family at the bedside.  Patient states [name] is her point of contact.  I spoke with patient's son/HCPOA, Karsten Ro to discuss diagnosis prognosis, GOC, EOL wishes, disposition and options.  Patient is familiar to our palliative team.  She was seen on previous admissions.  Please see note from colleague on 12/23-12/24.  Patient is currently followed by [facility] outpatient palliative with  last visit on 08/22/2020. I re-introduced Palliative Medicine as specialized medical care for people living with serious illness. It focuses on providing relief from the symptoms and stress of a serious illness. The goal is to improve quality of life for both the patient and the family.  We discussed a brief life review of the patient, along with her functional and nutritional status.  Patient is widowed x6 years.  She has 2 sons (1 whom resides in [place]).  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  She is of [place] faith.  Son shares prior to admission patient was most recently at a skilled nursing facility for rehabilitation in January.  He shares her decubitus ulcers developed and worsened during her stay there.  Patient most recently resided in the home with [name] and his wife (who is a Equities trader).  He acknowledges patient is need for total care and continued decline.  Reports patient had poor oral intake and would often sleep.  We discussed Her current illness and what it means in the larger context of Her on-going co-morbidities. Natural disease trajectory and expectations at EOL were discussed.  [name] is tearful during discussion.  He acknowledges similar updates received by attending over the past 24 hours.  He shares a struggle of knowing his mom's decline but also losing her.  He endorses that he knows that she is suffering and would not want to be in her current state of health as she has mentioned on multiple occasions to not only him and family but medical staff.  He shares his family including his wife are requesting for patient to  be allowed to be comfortable, no life prolonging measures, and spend what time she has live with family and friends peacefully.  The difference between a aggressive medical intervention and a palliative comfort care path were discussed at length.   Values and goals of care important to patient and family were  attempted to be elicited.   [name] is tearful expressing he knows that he is losing his mother and it is hard to accept but wants to do what is right for her. Emotional support provided.   He acknowledges her current relationship with outpatient palliative and is requesting to further discuss the differences between palliative and hospice. Hospice and Palliative Care services outpatient were explained.  Son verbalized his understanding and awareness of both palliative and hospice's goals and philosophy of care.   [name] is clear and expressed goals and wishes to continue with current plan of care while patient is hospitalized.  He is requesting to transition patient to hospice versus palliative at discharge with a focus on her comfort and end-of-life care.  He does not wish for rehospitalization, aggressive interventions, with a goal of focusing on her comfort and symptom management.  Discussed at length outpatient hospice in the home with 24/7 family support in addition to hospice support.  I also provided education on residential hospice facility if at any point patient symptoms become difficult to manage in the home.  Son is aware he may discuss needs at any time with his outpatient hospice team.  A detailed discussion was had today regarding advanced directives. [name] confirms DNR/DNI. Patient has a documented advanced directives including MOST form.  Documents reviewed and offered to provide updates as appropriate.  I encouraged son to consider updating MOST form.  He verbalized agreement however would like for his wife to be involved.  He plans to call once she is off work and is requesting to complete form electronically.  Questions and concerns were addressed. The family was encouraged to call with questions or concerns.  PMT will continue to support holistically as needed.   CODE STATUS: DNR  ADVANCE DIRECTIVES: Primary Decision Maker: [name] "Ship broker (son/HCPOA)   Palliative  Prophylaxis:   Aspiration, Bowel Regimen, Delirium Protocol, Eye Care, Frequent Pain Assessment, Oral Care, Palliative Wound Care and Turn Reposition  PSYCHO-SOCIAL/SPIRITUAL:  Support System: Family  Desire for further Chaplaincy support:NO   Additional Recommendations (Limitations, Scope, Preferences):  Minimize Medications and continue with current plan of care with goal of discharging home for comfort/EOL care   Education on hospice   PAST MEDICAL HISTORY: Past Medical History:  Diagnosis Date  . Arthritis   . Asthma   . Chronic back pain   . Depression   . History of pulmonary embolus (PE)   . Hx-TIA (transient ischemic attack)   . Hyperlipemia   . Hypertension   . Personal history of DVT (deep vein thrombosis)   . Scoliosis   . Sleep apnea    cpap - settingsi at 3   . Stroke ([facility])   . 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   MEDICATIONS:  Current Facility-Administered Medications  Medication Dose Route Frequency Provider Last Rate Last Admin  . 0.9 %  sodium chloride infusion   Intravenous PRN Little Ishikawa, MD 10 mL/hr at 10/25/20 0451 Infusion Verify at 10/25/20 0451  . acetaminophen (TYLENOL) tablet 650 mg  650 mg Oral Q6H PRN Rise Patience, MD   650 mg at 10/25/20  1030  . amoxicillin (AMOXIL) capsule 500 mg  500 mg Oral Q8H Carlyle Basques, MD   500 mg at 11/01/20 0552  . ascorbic acid (VITAMIN C) tablet 1,000 mg  1,000 mg Oral Daily Rise Patience, MD   1,000 mg at 10/31/20 1014  . busPIRone (BUSPAR) tablet 15 mg  15 mg Oral BID Rise Patience, MD   15 mg at 10/31/20 2302  . Chlorhexidine Gluconate Cloth 2 % PADS 6 each  6 each Topical Daily Hosie Poisson, MD   6 each at 10/31/20 1012  . collagenase (SANTYL) ointment   Topical Daily Hosie Poisson, MD   Given at 11/01/20 1008  . dextrose 5 %-0.45 % sodium chloride infusion   Intravenous Continuous Mercy Riding, MD 60 mL/hr at  10/31/20 1837 New Bag at 10/31/20 1837  . dronabinol (MARINOL) capsule 2.5 mg  2.5 mg Oral QHS Rise Patience, MD   2.5 mg at 10/31/20 2303  . feeding supplement (BOOST / RESOURCE BREEZE) liquid 1 Container  1 Container Oral BID BM Little Ishikawa, MD   1 Container at 11/01/20 1008  . feeding supplement (OSMOLITE 1.5 CAL) liquid 1,000 mL  1,000 mL Per Tube Continuous Mercy Riding, MD 50 mL/hr at 10/31/20 2316 Rate Change at 10/31/20 2316  . feeding supplement (PROSource TF) liquid 45 mL  45 mL Per Tube BID Wendee Beavers T, MD   45 mL at 10/31/20 2305  . ferrous sulfate tablet 325 mg  325 mg Oral Q breakfast Rise Patience, MD   325 mg at 10/29/20 1050  . heparin ADULT infusion 100 units/mL (25000 units/210m)  650 Units/hr Intravenous Continuous LAdrian Saran RPH 6.5 mL/hr at 11/01/20 1026 650 Units/hr at 11/01/20 1026  . linaclotide (LINZESS) capsule 145 mcg  145 mcg Oral Daily KRise Patience MD   145 mcg at 11/01/20 02263 . MEDLINE mouth rinse  15 mL Mouth Rinse BID LLittle Ishikawa MD   15 mL at 11/01/20 1009  . memantine (NAMENDA XR) 24 hr capsule 28 mg  28 mg Oral QHS KRise Patience MD   28 mg at 10/31/20 2306  . multivitamin with minerals tablet 1 tablet  1 tablet Oral Daily LLittle Ishikawa MD   1 tablet at 10/31/20 1013  . nitroGLYCERIN (NITROSTAT) SL tablet 0.4 mg  0.4 mg Sublingual Q5 min PRN KRise Patience MD      . nutrition supplement (JUVEN) (JUVEN) powder packet 1 packet  1 packet Per Tube BID BM GMercy Riding MD   1 packet at 11/01/20 1009  . oxyCODONE (Oxy IR/ROXICODONE) immediate release tablet 5 mg  5 mg Oral Q6H PRN GMercy Riding MD   5 mg at 11/01/20 0647  . phosphorus (K PHOS NEUTRAL) tablet 500 mg  500 mg Oral BID Dahal, Binaya, MD      . sodium chloride flush (NS) 0.9 % injection 10-40 mL  10-40 mL Intracatheter Q12H LLittle Ishikawa MD   10 mL at 10/31/20 2307  . sodium chloride flush (NS) 0.9 % injection 10-40 mL   10-40 mL Intracatheter PRN LLittle Ishikawa MD        VITAL SIGNS: BP (!) 105/51 (BP Location: Left Wrist)   Pulse 61   Temp 98.1 F (36.7 C) (Oral)   Resp 12   Ht '5\' 2"'  (1.575 m)   Wt 61.3 kg   SpO2 98%   BMI 24.72 kg/m  FAutoliv  10/19/20 2107  Weight: 61.3 kg    Estimated body mass index is 24.72 kg/m as calculated from the following:   Height as of this encounter: '5\' 2"'  (1.575 m).   Weight as of this encounter: 61.3 kg.  LABS: CBC:    Component Value Date/Time   WBC 9.5 11/01/2020 0500   HGB 7.6 (L) 11/01/2020 0500   HCT 24.4 (L) 11/01/2020 0500   PLT 154 11/01/2020 0500   Comprehensive Metabolic Panel:    Component Value Date/Time   NA 140 11/01/2020 0500   K 3.3 (L) 11/01/2020 0500   BUN 10 11/01/2020 0500   CREATININE 0.32 (L) 11/01/2020 0500   ALBUMIN 1.5 (L) 11/01/2020 0500     Review of Systems  Neurological: Positive for weakness.   Unless otherwise noted, a complete review of systems is negative.  Physical Exam General: NAD, frail chronically-ill appearing Cardiovascular: regular rate and rhythm Pulmonary: diminished bilaterally  Abdomen: soft, nontender, + bowel sounds, G-tube in place Skin: warm and dry, decubitus ulcers (not assessed) Neurological: awake, alert and oriented. Mood appropriate   Prognosis: < 6 months the setting of dementia, vertebral osteomyelitis, multiple decubitus ulcers, DVT/PE, hypertension, hyperlipidemia, bedbound, deconditioning, failure to thrive, poor oral intake (albumin 1.5).   Discharge Planning:  Home with Hospice  Recommendations: . DNR/DNI-as confirmed by son/HCPOA . Continue with current plan of care hospitalized with a goal of discharging home to focus on comfort/end-of-life care.  Son is not interested in rehospitalization or further aggressive interventions. . Detailed discussion with son/POA, [name], he is setting by his mother's overall poor prognosis however is realistic in his  understanding expressing he wishes to focus on eliminating any further suffering. Education provided on comfort/hospice care. He and wife plans to provide care in the home with support of outpatient hospice.  Patient is currently active with outpatient palliative ([facility]) however given changes in goals of care will need to transition to their hospice program.  (TOC referral placed). Marland Kitchen PMT will continue to support and follow as needed. Please call team line with urgent needs.   Palliative Performance Scale: PPS 10-20%              Son expressed understanding and was in agreement with this plan.   Thank you for allowing the Palliative Medicine Team to assist in the care of this patient. Please utilize secure chat with additional questions, if there is no response within 30 minutes please call the above phone number.   Time In: 1005 Time Out: 1115 Time Total: 70 min.   Visit consisted of counseling and education dealing with the complex and emotionally intense issues of symptom management and palliative care in the setting of serious and potentially life-threatening illness.Greater than 50%  of this time was spent counseling and coordinating care related to the above assessment and plan.  Signed by:  Alda Lea, AGPCNP-BC Palliative Medicine Team  Phone: [phone] Pager: [phone] Amion: [place] Team providers are available by phone from 7am to 7pm daily and can be reached through the team cell phone.  Should this patient require assistance outside of these hours, please call the patient's attending physician.

## 2020-11-01 NOTE — Progress Notes (Signed)
ANTICOAGULATION CONSULT NOTE  Pharmacy Consult for apixaban Indication: hx DVT/PE  Allergies  Allergen Reactions  . T-Pa [Alteplase] Swelling    Does okay with it if premedicated with benadryl.  . Codeine Itching    Patient Measurements: Height: 5\' 2"  (157.5 cm) Weight: 61.3 kg (135 lb 2.3 oz) IBW/kg (Calculated) : 50.1 Heparin Dosing Weight: actual body weight   Vital Signs: Temp: 98.1 F (36.7 C) (03/23 0456) Temp Source: Oral (03/23 0456) BP: 105/51 (03/23 0456) Pulse Rate: 61 (03/23 0456)  Labs: Recent Labs    10/30/20 0100 10/30/20 1310 10/31/20 0138 10/31/20 0327 10/31/20 1100 11/01/20 0500  HGB 7.2* 7.9*  --  7.8*  --  7.6*  HCT 23.4* 26.6*  --  25.5*  --  24.4*  PLT 171  --   --  172  --  154  APTT 110* 32 54*  --   --   --   LABPROT 16.2*  --   --   --   --   --   INR 1.4*  --   --   --   --   --   HEPARINUNFRC 1.42*  --  0.58  --  0.42 0.31  CREATININE 0.43*  --   --  0.31*  --  0.32*    Estimated Creatinine Clearance: 48.3 mL/min (A) (by C-G formula based on SCr of 0.32 mg/dL (L)).   Medical History: Past Medical History:  Diagnosis Date  . Arthritis   . Asthma   . Chronic back pain   . Depression   . History of pulmonary embolus (PE)   . Hx-TIA (transient ischemic attack)   . Hyperlipemia   . Hypertension   . Personal history of DVT (deep vein thrombosis)   . Scoliosis   . Sleep apnea    cpap - settingsi at 3   . Stroke (HCC)   . 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 Pharmacy consulted to dose IV heparin for bridge therapy while apixaban on hold for IR to place PEG tube. Last dose of Apixaban 3/19 at 9:09 AM. Baseline heparin level > 2.2, as expected due to recent Apixaban. Baseline aPTT 30 seconds.   S/p PEG placement in IR on 3/21.  Pharmacy is now consulted to transition back to oral apixaban.  Today, 11/01/20:  Heparin level 0.31 continues to be therapeutic on current  IV heparin rate of 650 units/hr  CBC: Hgb 7.6 low/stable, Pltc WNL  No bleeding or infusion issues noted per nursing.    3/22 SLP recommendations: Whole meds with puree  Goal of Therapy:  Heparin level 0.3-0.7 units/ml aPTT 66-102 seconds Monitor platelets by anticoagulation protocol: Yes   Plan:   D/C Heparin  Resume Apixaban 5mg  PO BID  Note patient is near cut-off for reduced dosing of apixaban.  If weight decreased to < 60 kg (along with age > 80 years), reduce to apixaban 2.5 mg BID.  If needed, may crush apixaban for administration either orally or per tube.     4/22 PharmD, BCPS Clinical Pharmacist [facility] main pharmacy [phone] 11/01/2020 11:53 AM

## 2020-11-01 NOTE — Progress Notes (Signed)
ANTICOAGULATION CONSULT NOTE  Pharmacy Consult for IV heparin  Indication: bridge therapy while Apixaban for hx DVT/PE on hold for PEG placement  Allergies  Allergen Reactions  . T-Pa [Alteplase] Swelling    Does okay with it if premedicated with benadryl.  . Codeine Itching    Patient Measurements: Height: 5\' 2"  (157.5 cm) Weight: 61.3 kg (135 lb 2.3 oz) IBW/kg (Calculated) : 50.1 Heparin Dosing Weight: actual body weight   Vital Signs: Temp: 98.1 F (36.7 C) (03/23 0456) Temp Source: Oral (03/23 0456) BP: 105/51 (03/23 0456) Pulse Rate: 61 (03/23 0456)  Labs: Recent Labs    10/30/20 0100 10/30/20 1310 10/31/20 0138 10/31/20 0327 10/31/20 1100 11/01/20 0500  HGB 7.2* 7.9*  --  7.8*  --  7.6*  HCT 23.4* 26.6*  --  25.5*  --  24.4*  PLT 171  --   --  172  --  154  APTT 110* 32 54*  --   --   --   LABPROT 16.2*  --   --   --   --   --   INR 1.4*  --   --   --   --   --   HEPARINUNFRC 1.42*  --  0.58  --  0.42 0.31  CREATININE 0.43*  --   --  0.31*  --  0.32*    Estimated Creatinine Clearance: 48.3 mL/min (A) (by C-G formula based on SCr of 0.32 mg/dL (L)).   Medical History: Past Medical History:  Diagnosis Date  . Arthritis   . Asthma   . Chronic back pain   . Depression   . History of pulmonary embolus (PE)   . Hx-TIA (transient ischemic attack)   . Hyperlipemia   . Hypertension   . Personal history of DVT (deep vein thrombosis)   . Scoliosis   . Sleep apnea    cpap - settingsi at 3   . Stroke (HCC)   . 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 Pharmacy consulted to dose IV heparin for bridge therapy while apixaban on hold for IR to place PEG tube. Last dose of Apixaban 3/19 at 9:09 AM. Baseline heparin level > 2.2, as expected due to recent Apixaban. Baseline aPTT 30 seconds.   Today, 11/01/20:  Heparin level 0.31 continues to be therapeutic on current IV heparin rate of 650  units/hr  CBC: Hgb 7.6 low/stable, Pltc WNL  No bleeding or infusion issues noted per nursing.  S/p PEG placement in IR on 3/21  Goal of Therapy:  Heparin level 0.3-0.7 units/ml aPTT 66-102 seconds Monitor platelets by anticoagulation protocol: Yes   Plan:   Continue heparin infusion at 650 units/hr   Daily CBC, heparin level  Monitor closely for s/sx of bleeding  F/u when apixaban can be resumed    4/21 PharmD, BCPS Clinical Pharmacist [facility] [phone] 11/01/2020 7:16 AM

## 2020-11-01 NOTE — Progress Notes (Signed)
PROGRESS NOTE  [name]  DOB: 14-May-1941  PCP: [name], MD JJO:[id number]  DOA: 10/19/2020  LOS: 13 days   Chief Complaint  Patient presents with  . Wound Check    Brief narrative: [name] is a 80 y.o. female with PMH significant for dementia, recent vertebral osteomyelitis (completed antibiotics on 09/08/2020), multiple decubitus ulcer, DVT/PE on Eliquis, OSA, HTN and HLD. Patient was brought to the ED on 10/19/2020 with failure to thrive.  Noted to have hypernatremia, infected decubitus ulcer with failed outpatient treatment with p.o. antibiotics.  She was admitted and treated with IV antibiotics and hypotonic fluid.   Hospital course was complicated by ICU/stepdown stay for acute hypoxemic respiratory failure due to aspiration pneumonia in the setting of acute metabolic encephalopathy.  CODE STATUS was changed to DNR/DNI per discussion between previous attending and family.  Received 7 days of Vanco and cefepime.  Respiratory failure and encephalopathy resolved.  Antibiotics was de-escalated to p.o. amoxicillin for her chronic thoracolumbar osteomyelitis.  On dysphagia 3 diet per SLP recommendation but p.o. intake remained poor.  PEG tube placed on 3/21.  Thoracolumbar wound seems to be worse with purulent drainage.   Subjective: Patient was seen and examined this morning.  Pleasant elderly African-American female.  Lying down in bed.  Not in distress.  Assessment/Plan: Infected multiple decubitus ulcer, POA -CT of the abdomen pelvis does not show any acute osteomyelitis at this time. 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  Repeat blood culture NGTD -Vanco and  cefepime 3/10-3/16>> p.o. Amoxil 3/16>> for 4 weeks, and outpt f/up in clinic in 4 weeks per ID. -Follow wound culture from thoracolumbar wound -3/22, repeat CT scan of lumbar spine was obtained which did not show any convincing evidence of acute osteomyelitis.  Acute hypoxic respiratory failure 2/2 aspiration event, resolving -CTA chest on 3/12 with complete collapse of RLL with large debris's in LLL bronchus  intermedius -Continue aspiration precautions -Antibiotics as above.  Acute metabolic encephalopathy Chronic dementia without behavioral disturbance -Oriented to self, place and person with seem to be her baseline. -Discontinued Aricept in the setting of bradycardia -Continue Namenda -Reorientation and delirium precautions  Sinus bradycardia:  -HR persistently 40s last night. EKG reported of junctional rhythm but seems to have small p-waves. HR improved to 56 when turned in bed for skin exam. -Continue holding Aricept -Replenish electrolytes -Patient is not a candidate for pacemaker  Hypovolemic hypernatremia: -Likely due to poor p.o. intake.  Resolved. -Continue monitoring  Hypokalemia/hypophosphatemia/hypomagnesemia:  -Labs this morning with potassium low at 3.3 and phosphorus low at 2.2.  Replacement ordered. Recent Labs  Lab 10/28/20 0331 10/29/20 0330 10/30/20 0100 10/31/20 0327 11/01/20 0500  K 3.6 3.9 3.7 3.0* 3.3*  MG 2.1 2.0 2.0 1.8 1.9  PHOS 3.3 2.6 2.2* 3.1 2.2*   Essential hypertension: Normotensive. 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 -SCD for VTE prophylaxis  Normocytic anemia -Gradual drop in H&H but seems to be stable now between 7 and 8. No report of melena or hematochezia. Anemia panel consistent with ACD.  Could be due to poor nutrition. Recent Labs    03/09/20 1011 05/05/20  1429 07/29/20 0500 07/29/20 0908  07/29/20 1520 10/29/20 0330 10/29/20 1441 10/30/20 0100 10/30/20 1310 10/31/20 0327 11/01/20 0500  HGB  --    < > 7.7* 7.6*   < > 7.9*  --  7.2* 7.9* 7.8* 7.6*  MCV  --    < > 94.7 95.6   < > 104.5*  --  103.5*  --  102.0* 102.1*  VITAMINB12 754  --  743  --   --   --  671  --   --   --   --   FOLATE 12.6  --   --   --   --   --  7.6  --   --   --   --   FERRITIN  --   --   --   --   --   --  153  --   --   --   --   TIBC  --   --  115*  --   --   --  92*  --   --   --   --   IRON  --   --  16*  --   --   --  52  --   --   --   --   RETICCTPCT  --   --   --  3.1  --   --  3.2*  --   --   --   --    < > = values in this interval not displayed.   Recurrent hypoglycemia: Likely due to poor p.o. intake.  Not on insulin.  BMP glucose 90. -Currently on D5-1/2NS. Will hold at this time.  Recent Labs  Lab 10/30/20 0740 10/30/20 0830 10/30/20 2100 10/31/20 1931 11/01/20 1141  GLUCAP 64* 69* 92 128* 169*   Mobility: Encourage ambulation Code Status:   Code Status: DNR  Nutritional status: Body mass index is 24.72 kg/m. Nutrition Problem: Increased nutrient needs Etiology: acute illness,wound healing Signs/Symptoms: estimated needs Diet Order            DIET DYS 3 Room service appropriate? Yes; Fluid consistency: Thin  Diet effective now                 DVT prophylaxis: Place and maintain sequential compression device Start: 10/30/20 0722 SCD's Start: 10/29/20 1605 apixaban (ELIQUIS) tablet 5 mg   Antimicrobials:  Amoxicillin Fluid: Normal saline Consultants: Palliative care Family Communication:  None at bedside  Status is: Inpatient  Remains inpatient appropriate because: On antibiotics, high risk of readmission, pending palliative care consultation  Dispo: The patient is from: Home              Anticipated d/c is to: Home              Patient currently is not medically stable to d/c.   Difficult to place patient No       Infusions:  . sodium chloride 10  mL/hr at 10/25/20 0451  . feeding supplement (OSMOLITE 1.5 CAL) 50 mL/hr at 10/31/20 2316    Scheduled Meds: . amoxicillin  500 mg Oral Q8H  . apixaban  5 mg Oral BID  . vitamin C  1,000 mg Oral Daily  . busPIRone  15 mg Oral BID  . Chlorhexidine Gluconate Cloth  6 each Topical Daily  . collagenase   Topical Daily  . dronabinol  2.5 mg Oral QHS  . feeding supplement  1 Container Oral BID BM  .  feeding supplement (PROSource TF)  45 mL Per Tube BID  . ferrous sulfate  325 mg Oral Q breakfast  . linaclotide  145 mcg Oral Daily  . mouth rinse  15 mL Mouth Rinse BID  . memantine  28 mg Oral QHS  . multivitamin with minerals  1 tablet Oral Daily  . nutrition supplement (JUVEN)  1 packet Per Tube BID BM  . phosphorus  500 mg Oral BID  . sodium chloride flush  10-40 mL Intracatheter Q12H    Antimicrobials: Anti-infectives (From admission, onward)   Start     Dose/Rate Route Frequency Ordered Stop   10/30/20 1200  ceFAZolin (ANCEF) IVPB 2g/100 mL premix        2 g 200 mL/hr over 30 Minutes Intravenous On call to O.R. 10/29/20 1604 10/31/20 0824   10/26/20 0930  amoxicillin (AMOXIL) capsule 500 mg        500 mg Oral Every 8 hours 10/26/20 0833 11/23/20 0559   10/21/20 2200  vancomycin (VANCOREADY) IVPB 750 mg/150 mL  Status:  Discontinued        750 mg 150 mL/hr over 60 Minutes Intravenous Every 24 hours 10/21/20 0739 10/26/20 0833   10/20/20 2200  vancomycin (VANCOREADY) IVPB 1250 mg/250 mL  Status:  Discontinued        1,250 mg 166.7 mL/hr over 90 Minutes Intravenous Every 24 hours 10/19/20 2253 10/21/20 0739   10/19/20 2315  ceFEPIme (MAXIPIME) 2 g in sodium chloride 0.9 % 100 mL IVPB  Status:  Discontinued        2 g 200 mL/hr over 30 Minutes Intravenous Every 12 hours 10/19/20 2226 10/26/20 0833   10/19/20 2300  vancomycin (VANCOREADY) IVPB 1000 mg/200 mL        1,000 mg 200 mL/hr over 60 Minutes Intravenous  Once 10/19/20 2209 10/20/20 0135   10/19/20 2300  ceFEPIme (MAXIPIME)  2 g in sodium chloride 0.9 % 100 mL IVPB  Status:  Discontinued        2 g 200 mL/hr over 30 Minutes Intravenous  Once 10/19/20 2209 10/19/20 2226   10/19/20 1615  ceFAZolin (ANCEF) IVPB 1 g/50 mL premix        1 g 100 mL/hr over 30 Minutes Intravenous  Once 10/19/20 1614 10/19/20 1931      PRN meds: sodium chloride, acetaminophen, nitroGLYCERIN, oxyCODONE, sodium chloride flush   Objective: Vitals:   11/01/20 0456 11/01/20 1233  BP: (!) 105/51 112/64  Pulse: 61 94  Resp: 12 16  Temp: 98.1 F (36.7 C) 99.6 F (37.6 C)  SpO2: 98% 96%    Intake/Output Summary (Last 24 hours) at 11/01/2020 1410 Last data filed at 11/01/2020 1022 Gross per 24 hour  Intake 2828.07 ml  Output 250 ml  Net 2578.07 ml   Filed Weights   10/19/20 2107  Weight: 61.3 kg   Weight change:  Body mass index is 24.72 kg/m.   Physical Exam: General exam: Elderly African-American female.  Lying down in bed.  Not in distress Skin: No rashes, lesions or ulcers. HEENT: Atraumatic, normocephalic, no obvious bleeding Lungs: Clear to auscultation bilaterally CVS: Regular rate and rhythm, no murmur GI/Abd soft, nontender, nondistended, bowel sound present CNS: Alert, awake, oriented to place Psychiatry: Depressed look Extremities: No pedal edema, no calf tenderness  Data Review: I have personally reviewed the laboratory data and studies available.  Recent Labs  Lab 10/26/20 0606 10/27/20 0303 10/29/20 0330 10/30/20 0100 10/30/20 1310 10/31/20 0327 11/01/20 0500  WBC 9.2  --  8.6 9.7  --  9.5 9.5  HGB 9.0*   < > 7.9* 7.2* 7.9* 7.8* 7.6*  HCT 29.6*   < > 25.7* 23.4* 26.6* 25.5* 24.4*  MCV 102.1*  --  104.5* 103.5*  --  102.0* 102.1*  PLT 159  --  163 171  --  172 154   < > = values in this interval not displayed.   Recent Labs  Lab 10/28/20 0331 10/29/20 0330 10/30/20 0100 10/31/20 0327 11/01/20 0500  NA 140 142 140 138 140  K 3.6 3.9 3.7 3.0* 3.3*  CL 112* 114* 114* 110 110  CO2 21* 22  23 23 24   GLUCOSE 74 81 78 90 179*  BUN 7* 18 16 11 10   CREATININE 0.48 0.42* 0.43* 0.31* 0.32*  CALCIUM 8.6* 8.6* 8.4* 8.2* 8.0*  MG 2.1 2.0 2.0 1.8 1.9  PHOS 3.3 2.6 2.2* 3.1 2.2*    F/u labs ordered Unresulted Labs (From admission, onward)          Start     Ordered   10/31/20 2032  Aerobic Culture w Gram Stain (superficial specimen)  Once,   R        10/31/20 2032   10/31/20 0500  Renal function panel  Daily,   R     Question:  Specimen collection method  Answer:  IV Team=IV Team collect   10/30/20 1515   10/31/20 0500  Magnesium  Daily,   R     Question:  Specimen collection method  Answer:  IV Team=IV Team collect   10/30/20 1515   10/30/20 0722  Occult blood card to lab, stool  Once,   R        10/30/20 0721   10/29/20 0730  CBC  Daily,   R     Question:  Specimen collection method  Answer:  IV Team=IV Team collect   10/28/20 2327          Signed, 10/30/20, MD [facility] 11/01/2020

## 2020-11-02 LAB — RENAL FUNCTION PANEL
Albumin: 1.4 g/dL — ABNORMAL LOW (ref 3.5–5.0)
Anion gap: 6 (ref 5–15)
BUN: 18 mg/dL (ref 8–23)
CO2: 24 mmol/L (ref 22–32)
Calcium: 7.9 mg/dL — ABNORMAL LOW (ref 8.9–10.3)
Chloride: 109 mmol/L (ref 98–111)
Creatinine, Ser: 0.45 mg/dL (ref 0.44–1.00)
GFR, Estimated: >60 mL/min (ref >60)
Glucose, Bld: 142 mg/dL — ABNORMAL HIGH (ref 70–99)
Phosphorus: 1.3 mg/dL — ABNORMAL LOW (ref 2.5–4.6)
Potassium: 4.2 mmol/L (ref 3.5–5.1)
Sodium: 139 mmol/L (ref 135–145)

## 2020-11-02 LAB — CBC
HCT: 23.8 % — ABNORMAL LOW (ref 36.0–46.0)
Hemoglobin: 7.3 g/dL — ABNORMAL LOW (ref 12.0–15.0)
MCH: 31.7 pg (ref 26.0–34.0)
MCHC: 30.7 g/dL (ref 30.0–36.0)
MCV: 103.5 fL — ABNORMAL HIGH (ref 80.0–100.0)
Platelets: 161 10*3/uL (ref 150–400)
RBC: 2.3 MIL/uL — ABNORMAL LOW (ref 3.87–5.11)
RDW: 18.5 % — ABNORMAL HIGH (ref 11.5–15.5)
WBC: 9.4 10*3/uL (ref 4.0–10.5)
nRBC: 0.2 % (ref 0.0–0.2)

## 2020-11-02 LAB — PHOSPHORUS: Phosphorus: 1.4 mg/dL — ABNORMAL LOW (ref 2.5–4.6)

## 2020-11-02 LAB — MAGNESIUM: Magnesium: 1.7 mg/dL (ref 1.7–2.4)

## 2020-11-02 LAB — GLUCOSE, CAPILLARY
Glucose-Capillary: 111 mg/dL — ABNORMAL HIGH (ref 70–99)
Glucose-Capillary: 115 mg/dL — ABNORMAL HIGH (ref 70–99)

## 2020-11-02 MED ORDER — POTASSIUM PHOSPHATES 15 MMOLE/5ML IV SOLN
30.0000 mmol | Freq: Once | INTRAVENOUS | Status: AC
  Administered 2020-11-02: 30 mmol via INTRAVENOUS
  Filled 2020-11-02: qty 10

## 2020-11-02 MED ORDER — FREE WATER: 100.0000 mL | Freq: Four times a day (QID) | 0 refills | Status: AC

## 2020-11-02 MED ORDER — AMOXICILLIN 500 MG PO CAPS: 500.0000 mg | ORAL_CAPSULE | Freq: Three times a day (TID) | ORAL | 0 refills | Status: AC

## 2020-11-02 MED ORDER — OSMOLITE 1.5 CAL PO LIQD: 1000.0000 mL | ORAL | 0 refills | Status: AC

## 2020-11-02 MED ORDER — PROSOURCE TF PO LIQD: 45.0000 mL | Freq: Two times a day (BID) | ORAL | 0 refills | Status: AC

## 2020-11-02 MED ORDER — FREE WATER
100.0000 mL | Freq: Four times a day (QID) | Status: DC
  Administered 2020-11-02 – 2020-11-07 (×21): 100 mL

## 2020-11-02 MED ORDER — JUVEN PO PACK: 1.0000 | PACK | Freq: Two times a day (BID) | ORAL | 0 refills | Status: AC

## 2020-11-02 MED ORDER — K PHOS MONO-SOD PHOS DI & MONO 155-852-130 MG PO TABS: 250.0000 mg | ORAL_TABLET | Freq: Every day | ORAL | 0 refills | Status: DC

## 2020-11-02 NOTE — Care Management Important Message (Signed)
Important Message  Patient Details IM Letter given to the Patient. Name: [name] MRN: [id number] Date of Birth: April 29, 1941   Medicare Important Message Given:  Yes     Caren Macadam 11/02/2020, 11:52 AM

## 2020-11-02 NOTE — TOC Progression Note (Addendum)
Transition of Care [facility]) - Progression Note    Patient Details  Name: LENDY DITTRICH MRN: [id number] Date of Birth: Jul 13, 1941  [facility]) CM/SW Contact  Darleene Cleaver, [place] Phone Number: 11/02/2020, 3:16 PM  Clinical Narrative:     CSW spoke to patient's son [name] [phone] to discuss disposition plan.  Patient's son expressed that he does not want hospice at home at this time.  Patient's son would like to consider going home with home health and palliative following or possibly LTACH.  CSW asked if he is interested in going to a SNF, patient son said no because of the last time she was in a SNF, they did not have a good experience and he feels patient received more wounds after discharge.  CSW was informed by patient's son that he would like to consider LTACH and gave CSW permission to contact [facility].  Patient is currently active with [facility], PT and RN.   Expected Discharge Plan: Skilled Nursing Facility Barriers to Discharge: Continued Medical Work up  Expected Discharge Plan and Services Expected Discharge Plan: Skilled Nursing Facility   Discharge Planning Services: CM Consult   Living arrangements for the past 2 months: Single Family Home Expected Discharge Date: 11/02/20                                     Social Determinants of Health (SDOH) Interventions    Readmission Risk Interventions No flowsheet data found.

## 2020-11-02 NOTE — Plan of Care (Signed)
  Problem: Education: Goal: Knowledge of General Education information will improve Description: Including pain rating scale, medication(s)/side effects and non-pharmacologic comfort measures Outcome: Progressing   Problem: Clinical Measurements: Goal: Will remain free from infection Outcome: Progressing   

## 2020-11-02 NOTE — Discharge Summary (Signed)
Physician Discharge Summary  [name] DOB: 11/01/1940 DOA: 10/19/2020  PCP: [name], MD  Admit date: 10/19/2020 Discharge date: 11/02/2020  Admitted From: Home Discharge disposition: home with home health   Code Status: DNR  Diet Recommendation: Dysphagia 3 diet as tolerated Discharge Diagnosis:   Principal Problem:   Pressure injury of skin with infection Active Problems:   HTN (hypertension)   Takotsubo cardiomyopathy   OSA (obstructive sleep apnea)   Cellulitis   Hypernatremia  Chief Complaint  Patient presents with  . Wound Check    Brief narrative: [name] is a 80 y.o. female with PMH significant for dementia, recent vertebral osteomyelitis (completed antibiotics on 09/08/2020), multiple decubitus ulcer, DVT/PE on Eliquis, OSA, HTN and HLD. Patient was brought to the ED on 10/19/2020 with failure to thrive.  Noted to have hypernatremia, infected decubitus ulcer with failed outpatient treatment with p.o. antibiotics.  She was admitted and treated with IV antibiotics and hypotonic fluid.   Hospital course was complicated by ICU/stepdown stay for acute hypoxemic respiratory failure due to aspiration pneumonia in the setting of acute metabolic encephalopathy.  CODE STATUS was changed to DNR/DNI per discussion between previous attending and family.  Received 7 days of Vanco and cefepime.  Respiratory failure and encephalopathy resolved.  Antibiotics was de-escalated to p.o. amoxicillin for her chronic thoracolumbar osteomyelitis.  On dysphagia 3 diet per SLP recommendation but p.o. intake remained poor.  PEG tube placed on 3/21.  Thoracolumbar wound seems to be worse with purulent drainage.   Subjective: Patient was seen and examined this morning. Pleasant elderly African-American female. Lying down in bed. Not in distress.  Assessment/Plan: Infected multiple decubitus ulcer, POA -CT of the abdomen pelvis does not show any acute  osteomyelitis at this time. 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  Repeat blood culture NGTD -Vanco and cefepime 3/10-3/16>> p.o. Amoxil 3/16>> for 4 weeks, and outpt f/up in clinic in 4 weeks per ID. -3/22, repeat CT scan of lumbar spine was obtained which did not show any convincing evidence of acute osteomyelitis.  Acute hypoxic respiratory failure 2/2 aspiration event, resolving -CTA chest on 3/12 with complete collapse of RLL with large debris's in LLL bronchus  intermedius -Continue aspiration precautions -Antibiotics as above.  Acute metabolic encephalopathy Chronic dementia without behavioral disturbance -Oriented to self, place and person with seem to be her baseline. -Discontinued Aricept in the setting of bradycardia -Continue Namenda -Reorientation and delirium precautions  Sinus bradycardia:  -HR improved after Aricept was held.   Hypovolemia/hypernatremia: -Likely due to poor p.o. intake.  Resolved. -Continue monitoring  Hypokalemia/hypophosphatemia/hypomagnesemia:  -Labs this morning with potassium low at 3.3 and phosphorus low at 2.2. Replacement ordered. Recent Labs  Lab 10/29/20 0330 10/30/20 0100 10/31/20 0327 11/01/20 0500 11/02/20 0445  K 3.9 3.7 3.0* 3.3* 4.2  MG 2.0 2.0 1.8 1.9 1.7  PHOS 2.6 2.2* 3.1 2.2* 1.3*  1.4*   Essential hypertension:  -Normotensive.  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 No report of melena or hematochezia. -Anemia  panel consistent with ACD.  Could be due to poor nutrition. Recent Labs    03/09/20 1011 05/05/20 1429 07/29/20 0500 07/29/20 0908 07/29/20 1520 10/29/20 1441 10/30/20 0100 10/30/20 1310 10/31/20 0327 11/01/20 0500 11/02/20 0445  HGB  --    < > 7.7* 7.6*   < >  --  7.2* 7.9* 7.8* 7.6* 7.3*  MCV  --    < > 94.7 95.6   < >  --  103.5*  --  102.0* 102.1* 103.5*  VITAMINB12 754  --  743  --   --  671  --   --   --   --   --   FOLATE 12.6  --   --   --   --  7.6  --   --   --   --   --   FERRITIN  --   --   --   --   --  153  --   --   --   --   --   TIBC  --   --  115*  --   --  92*  --   --   --   --   --   IRON  --   --  16*  --   --  52  --   --   --   --   --   RETICCTPCT  --   --   --  3.1  --  3.2*  --   --   --   --   --    < > = values in this interval not displayed.   Recurrent hypoglycemia:  -Likely due to poor p.o. intake.  Not on insulin.    Glucose level stable.  Recent Labs  Lab 10/31/20 1931 11/01/20 1141 11/01/20 1648 11/02/20 0023 11/02/20 1136  GLUCAP 128* 169* 106* 115* 111*    Wound care: Wound / Incision (Open or Dehisced) 05/27/20 (MASD) Moisture Associated Skin Damage Perineum Right;Left (Active)  Date First Assessed/Time First Assessed: 05/27/20 0610   Wound Type: (MASD) Moisture Associated Skin Damage  Location: Perineum  Location Orientation: Right;Left    Assessments 05/27/2020  6:10 AM 05/29/2020 10:31 PM  Dressing Type - None  Site / Wound Assessment - Clean;Dry  Treatment Other (Comment) Cleansed;Other (Comment)     No Linked orders to display     Pressure Injury 10/19/20 Vertebral column Left;Mid Unstageable - Full thickness tissue loss in which the base of the injury is covered by slough (yellow, tan, gray, green or brown) and/or eschar (tan, brown or black) in the wound bed. eschar d/t removal  (Active)  Date First Assessed/Time First Assessed: 10/19/20 2020   Location: Vertebral column  Location Orientation: Left;Mid  Staging:  Unstageable - Full thickness tissue loss in which the base of the injury is covered by slough (yellow, tan, gray, green or br...    Assessments 10/24/2020 11:45 AM 11/02/2020 10:48 AM  Dressing Type Santyl;Foam - Lift dressing to assess site every shift Foam - Lift dressing to assess site every shift;Gauze (Comment);Santyl  Dressing Changed;Clean;Intact;New drainage Changed;Clean;Dry;Intact  Dressing Change Frequency Daily Daily  State of Healing Eschar Eschar  Peri-wound Assessment Erythema (non-blanchable);Pink -  Wound Length (cm) 6.5 cm -  Wound Width (cm) 5 cm -  Wound Depth (cm) 0 cm -  Wound Surface Area (cm^2) 32.5 cm^2 -  Wound Volume (cm^3) 0 cm^3 -  Drainage Amount Minimal Moderate  Drainage Description Purulent Purulent  Treatment Cleansed Cleansed     No Linked orders to display     Pressure Injury 10/19/20 Buttocks Left Stage 4 - Full thickness tissue loss with exposed bone, tendon or muscle. 7x2x3, tunneling at 12'oclock-6cm, 9'oclock-1.5cm. (Active)  Date First Assessed/Time First Assessed: 10/19/20 2020   Location: Buttocks  Location Orientation: Left  Staging: Stage 4 - Full thickness tissue loss with exposed bone, tendon or muscle.  Wound Description (Comments): 7x2x3, tunneling at 12'oclock-6c...    Assessments 10/24/2020 11:45 AM 11/02/2020 10:48 AM  Dressing Type Foam - Lift dressing to assess site every shift;Moist to dry Foam - Lift dressing to assess site every shift;Gauze (Comment);Santyl  Dressing Changed Changed;Clean;Dry;Intact  Dressing Change Frequency Daily Daily  State of Healing - Non-healing  Margins Attached edges (approximated) -  Drainage Amount Scant Moderate  Drainage Description Purulent;Serosanguineous Purulent;Serosanguineous  Treatment Cleansed;Packing (Saline gauze) Cleansed     No Linked orders to display     Pressure Injury 10/19/20 Buttocks Mid Stage 3 -  Full thickness tissue loss. Subcutaneous fat may be visible but bone, tendon or  muscle are NOT exposed. intragluteal cluster, 8.5x5 (Active)  Date First Assessed/Time First Assessed: 10/19/20 2020   Location: Buttocks  Location Orientation: Mid  Staging: Stage 3 -  Full thickness tissue loss. Subcutaneous fat may be visible but bone, tendon or muscle are NOT exposed.  Wound Description (Com...    Assessments 10/24/2020 11:45 AM 11/02/2020 10:48 AM  Dressing Type Foam - Lift dressing to assess site every shift Foam - Lift dressing to assess site every shift;Gauze (Comment);Santyl  Dressing Changed;Clean;Intact Clean;Dry;Intact;Changed  Dressing Change Frequency Daily Daily  State of Healing Epithelialized -  Site / Wound Assessment Clean;Dry;Pink;Red;Yellow -  Peri-wound Assessment Intact;Erythema (non-blanchable) -  Margins Attached edges (approximated) -  Drainage Amount None None  Treatment Cleansed Cleansed     No Linked orders to display    Discharge Exam:   Vitals:   11/01/20 1233 11/01/20 2054 11/02/20 0540 11/02/20 1451  BP: 112/64 (!) 102/45 (!) 101/42 (!) 114/52  Pulse: 94 75 93 82  Resp: 16 16 16 14   Temp: 99.6 F (37.6 C) 98.8 F (37.1 C) 98.9 F (37.2 C) 98.6 F (37 C)  TempSrc: Oral Oral Oral Oral  SpO2: 96% 99% 94% 100%  Weight:      Height:        Body mass index is 24.72 kg/m.  General exam: Elderly African-American female.  Lying down in bed.  Not in distress.  Slow to respond Skin: No rashes, lesions or ulcers. HEENT: Atraumatic, normocephalic, no obvious bleeding Lungs: Clear to auscultation bilaterally CVS: Regular rate and rhythm, no murmur GI/Abd soft, nontender, nondistended, bowel sound present, PEG tube site intact CNS: Alert, awake, oriented to place Psychiatry: Depressed look Extremities: No pedal edema, no calf tenderness  Follow ups:   Discharge Instructions    Diet general   Complete by: As directed    Tube feeding   Discharge wound care:   Complete by: As directed    Cleanse wounds to spine and sacrum/left  buttocks with NS and pat dry.  Apply Santyl to open wounds.  Cover wth NS moist gauze.  Secure with foam dressing.   Will order low air loss bed Will offload heels with PRevalon boots   Increase activity slowly   Complete by: As directed       Follow-up Information    [name], MD Follow up.   Specialty: Internal Medicine Contact information: [phone] [facility]  [address] [place] [address] [phone]        Jodelle Redhristopher, Bridgette, MD .   Specialty: Cardiology Contact information: [address] [place] [address] [phone]               Recommendations for Outpatient Follow-Up:   1. Follow-up with PCP as an outpatient  Discharge Instructions:  Follow with Primary MD Fleet ContrasAvbuere, Edwin, MD in 7 days   Get CBC/BMP checked in next visit within 1 week by PCP or SNF MD ( we routinely change or add medications that can affect your baseline labs and fluid status, therefore we recommend that you get the mentioned basic workup next visit with your PCP, your PCP may decide not to get them or add new tests based on their clinical decision)  On your next visit with your PCP, please Get Medicines reviewed and adjusted.  Please request your PCP  to go over all Hospital Tests and Procedure/Radiological results at the follow up, please get all Hospital records sent to your Prim MD by signing hospital release before you go home.  Activity: As tolerated with Full fall precautions use walker/cane & assistance as needed  For Heart failure patients - Check your Weight same time everyday, if you gain over 2 pounds, or you develop in leg swelling, experience more shortness of breath or chest pain, call your Primary MD immediately. Follow Cardiac Low Salt Diet and 1.5 lit/day fluid restriction.  If you have smoked or chewed Tobacco in the last 2 yrs please stop smoking, stop any regular Alcohol  and or any Recreational drug use.  If you experience worsening of your admission  symptoms, develop shortness of breath, life threatening emergency, suicidal or homicidal thoughts you must seek medical attention immediately by calling 911 or calling your MD immediately  if symptoms less severe.  You Must read complete instructions/literature along with all the possible adverse reactions/side effects for all the Medicines you take and that have been prescribed to you. Take any new Medicines after you have completely understood and accpet all the possible adverse reactions/side effects.   Do not drive, operate heavy machinery, perform activities at heights, swimming or participation in water activities or provide baby sitting services if your were admitted for syncope or siezures until you have seen by Primary MD or a Neurologist and advised to do so again.  Do not drive when taking Pain medications.  Do not take more than prescribed Pain, Sleep and Anxiety Medications  Wear Seat belts while driving.   Please note You were cared for by a hospitalist during your hospital stay. If you have any questions about your discharge medications or the care you received while you were in the hospital after you are discharged, you can call the unit and asked to speak with the hospitalist on call if the hospitalist that took care of you is not available. Once you are discharged, your primary care physician will handle any further medical issues. Please note that NO REFILLS for any discharge medications will be authorized once you are discharged, as it is imperative that you return to your primary care physician (or establish a relationship with a primary care physician if you do not have one) for your aftercare needs so that they can reassess your need for medications and monitor your lab values.    Allergies as of 11/02/2020      Reactions   T-pa [alteplase] Swelling   Does okay with it if premedicated with benadryl.  Codeine Itching      Medication List    STOP taking these medications    amLODipine 10 MG tablet Commonly known as: NORVASC   furosemide 40 MG tablet Commonly known as: LASIX   lisinopril 5 MG tablet Commonly known as: ZESTRIL     TAKE these medications   acetaminophen 325 MG tablet Commonly known as: TYLENOL Take 2 tablets (650 mg total) by mouth every 4 (four) hours as needed for mild pain or moderate pain (or Fever >/= 101).   amoxicillin 500 MG capsule Commonly known as: AMOXIL Take 1 capsule (500 mg total) by mouth every 8 (eight) hours for 23 days.   atorvastatin 20 MG tablet Commonly known as: LIPITOR Take 20 mg by mouth every evening.   busPIRone 30 MG tablet Commonly known as: BUSPAR Take 15 mg by mouth 2 (two) times daily.   cholecalciferol 25 MCG (1000 UNIT) tablet Commonly known as: VITAMIN D3 Take 1,000 Units by mouth daily.   donepezil 10 MG tablet Commonly known as: ARICEPT Take 1 tablet (10 mg total) by mouth at bedtime.   dronabinol 2.5 MG capsule Commonly known as: MARINOL Take 2.5 mg by mouth at bedtime.   Eliquis 5 MG Tabs tablet Generic drug: apixaban Take 5 mg by mouth 2 (two) times daily.   feeding supplement Liqd Take 237 mLs by mouth 3 (three) times daily between meals. What changed: Another medication with the same name was added. Make sure you understand how and when to take each.   nutrition supplement (JUVEN) Pack Place 1 packet into feeding tube 2 (two) times daily between meals. What changed: You were already taking a medication with the same name, and this prescription was added. Make sure you understand how and when to take each.   feeding supplement (PROSource TF) liquid Place 45 mLs into feeding tube 2 (two) times daily. What changed: You were already taking a medication with the same name, and this prescription was added. Make sure you understand how and when to take each.   feeding supplement (OSMOLITE 1.5 CAL) Liqd Place 1,000 mLs into feeding tube continuous. What changed: You were already  taking a medication with the same name, and this prescription was added. Make sure you understand how and when to take each.   ferrous sulfate 325 (65 FE) MG tablet Take 1 tablet (325 mg total) by mouth daily with breakfast.   free water Soln Place 100 mLs into feeding tube every 6 (six) hours.   Linzess 145 MCG Caps capsule Generic drug: linaclotide Take 145 mcg by mouth daily.   memantine 28 MG Cp24 24 hr capsule Commonly known as: NAMENDA XR Take 1 capsule (28 mg total) by mouth daily. What changed: when to take this   MULTI-VITAMIN GUMMIES PO Take 1 each by mouth daily.   nitroGLYCERIN 0.4 MG SL tablet Commonly known as: NITROSTAT Place 1 tablet (0.4 mg total) under the tongue every 5 (five) minutes as needed for chest pain.   oxyCODONE 15 MG immediate release tablet Commonly known as: ROXICODONE Take 7.5-15 mg by mouth 4 (four) times daily as needed for pain.   phosphorus 155-852-130 MG tablet Commonly known as: K PHOS NEUTRAL Take 1 tablet (250 mg total) by mouth daily for 7 days.   polyethylene glycol 17 g packet Commonly known as: MIRALAX / GLYCOLAX Take 17 g by mouth 2 (two) times daily.   potassium chloride 10 MEQ tablet Commonly known as: KLOR-CON Take 10 mEq by mouth daily.  prochlorperazine 10 MG tablet Commonly known as: COMPAZINE Take 10 mg by mouth every 6 (six) hours as needed for nausea or vomiting.   Restasis 0.05 % ophthalmic emulsion Generic drug: cycloSPORINE Place 1 drop into both eyes daily as needed (dry eyes).   vitamin C 1000 MG tablet Take 1,000 mg by mouth daily.            Discharge Care Instructions  (From admission, onward)         Start     Ordered   11/02/20 0000  Discharge wound care:       Comments: Cleanse wounds to spine and sacrum/left buttocks with NS and pat dry.  Apply Santyl to open wounds.  Cover wth NS moist gauze.  Secure with foam dressing.   Will order low air loss bed Will offload heels with PRevalon  boots   11/02/20 1411          Time coordinating discharge: 35 minutes  The results of significant diagnostics from this hospitalization (including imaging, microbiology, ancillary and laboratory) are listed below for reference.    Procedures and Diagnostic Studies:   CT ABDOMEN PELVIS W CONTRAST  Result Date: 10/19/2020 CLINICAL DATA:  Abdominal abscess/infection. Drainage from back wounds. EXAM: CT ABDOMEN AND PELVIS WITH CONTRAST TECHNIQUE: Multidetector CT imaging of the abdomen and pelvis was performed using the standard protocol following bolus administration of intravenous contrast. CONTRAST:  OMNIPAQUE IOHEXOL 300 MG/ML  SOLN COMPARISON:  07/21/2020 FINDINGS: Lower chest: Mild basilar scarring.  No active lower chest disease. Hepatobiliary: No liver parenchymal lesion. Chronic intra and extrahepatic biliary ductal dilatation which may be becoming more prominent over time. The common duct appears to be dilated all the way to the ampulla. No calcified gallstones are seen. The gallbladder is distended. Pancreas: No pancreatic parenchymal lesion is seen. Pancreatic duct is mildly dilated. Spleen: Negative Adrenals/Urinary Tract: No adrenal or renal lesion. Bladder is unremarkable. Stomach/Bowel: Moderate amount of fecal matter in the colon. No other bowel finding. Vascular/Lymphatic: Aortic atherosclerosis. No aneurysm. IVC is normal. No adenopathy. Reproductive: Uterine leiomyomas.  No significant pelvic mass. Other: No free fluid or air. 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 No evidence of significant soft tissue abscess collection. See results of lumbar CT. IMPRESSION: 1. 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 No evidence of significant soft tissue abscess collection. See results of lumbar CT. 2. Chronic intra and  extrahepatic biliary ductal dilatation which may be becoming more prominent over time. The common duct appears to be dilated all the way to the ampulla. No calcified gallstones are seen. The gallbladder is distended. Consider ultrasound and/or ERCP if there is clinical evidence of biliary obstruction. 3. Aortic atherosclerosis. 4. Uterine leiomyomas. 5. Moderate amount of fecal matter in the colon. Aortic Atherosclerosis (ICD10-I70.0).  A Electronically Signed   By: Paulina Fusi M.D.   On: 10/19/2020 18:24   CT L-SPINE NO CHARGE  Result Date: 10/19/2020 CLINICAL DATA:  Decubitus ulcer.  In EXAM: CT LUMBAR SPINE WITHOUT CONTRAST TECHNIQUE: Multidetector CT imaging of the lumbar spine was performed without intravenous contrast administration. Multiplanar CT image reconstructions were also generated. COMPARISON:  CT abdomen same day.  Previous lumbar radiographs. FINDINGS: Segmentation: 5 lumbar type vertebral bodies. Alignment: Thoracolumbar curvature convex to the right and lower lumbar curvature convex to the left. Vertebrae: There is been previous fusion  from L1 to the sacrum, with hardware removal. Screw shadows remain visible in the pedicles. There appears to be solid union throughout that segment. There are hypertrophic sclerotic changes of the posterior elements at T11, T12 and L1 which could relate to chronic degenerative change, but the possibility of chronic osteomyelitis in that region is not excluded. There is no gross destructive bone change. The posterior elements at T11, T12 and L1 are immediately at the area of the decubitus ulcer. Increased kyphotic curvature in that region probably places the overlying soft tissue at risk. There are old minor compression deformities at T11 and T12 Paraspinal and other soft tissues: No evidence of para spinous soft tissue abscess. Disc levels: As noted above, distant fusion through the lumbosacral region with sufficient patency of the canal. IMPRESSION: 1.  Previous fusion from L1 to the sacrum with hardware removal. There appears to be solid union throughout that segment. 2. Hypertrophic sclerotic changes of the posterior elements at T11, T12 and L1 which could relate to chronic degenerative change, but the possibility of chronic osteomyelitis in that region is not excluded. There is no gross destructive bone change. There is increased kyphotic curvature in that region which probably places the overlying soft tissue at risk. There is no evidence of para spinous soft tissue abscess. The posterior elements of T11, T12 and L1 are closely related to the area of decubitus ulcer. Electronically Signed   By: Paulina Fusi M.D.   On: 10/19/2020 18:31     Labs:   Basic Metabolic Panel: Recent Labs  Lab 10/29/20 0330 10/30/20 0100 10/31/20 0327 11/01/20 0500 11/02/20 0445  NA 142 140 138 140 139  K 3.9 3.7 3.0* 3.3* 4.2  CL 114* 114* 110 110 109  CO2 22 23 23 24 24   GLUCOSE 81 78 90 179* 142*  BUN 18 16 11 10 18   CREATININE 0.42* 0.43* 0.31* 0.32* 0.45  CALCIUM 8.6* 8.4* 8.2* 8.0* 7.9*  MG 2.0 2.0 1.8 1.9 1.7  PHOS 2.6 2.2* 3.1 2.2* 1.3*  1.4*   GFR Estimated Creatinine Clearance: 48.3 mL/min (by C-G formula based on SCr of 0.45 mg/dL). Liver Function Tests: Recent Labs  Lab 10/29/20 0330 10/30/20 0100 10/31/20 0327 11/01/20 0500 11/02/20 0445  ALBUMIN 1.6* 1.5* 1.7* 1.5* 1.4*   No results for input(s): LIPASE, AMYLASE in the last 168 hours. No results for input(s): AMMONIA in the last 168 hours. Coagulation profile Recent Labs  Lab 10/30/20 0100  INR 1.4*    CBC: Recent Labs  Lab 10/29/20 0330 10/30/20 0100 10/30/20 1310 10/31/20 0327 11/01/20 0500 11/02/20 0445  WBC 8.6 9.7  --  9.5 9.5 9.4  HGB 7.9* 7.2* 7.9* 7.8* 7.6* 7.3*  HCT 25.7* 23.4* 26.6* 25.5* 24.4* 23.8*  MCV 104.5* 103.5*  --  102.0* 102.1* 103.5*  PLT 163 171  --  172 154 161   Cardiac Enzymes: No results for input(s): CKTOTAL, CKMB, CKMBINDEX,  TROPONINI in the last 168 hours. BNP: Invalid input(s): POCBNP CBG: Recent Labs  Lab 10/31/20 1931 11/01/20 1141 11/01/20 1648 11/02/20 0023 11/02/20 1136  GLUCAP 128* 169* 106* 115* 111*   D-Dimer No results for input(s): DDIMER in the last 72 hours. Hgb A1c No results for input(s): HGBA1C in the last 72 hours. Lipid Profile No results for input(s): CHOL, HDL, LDLCALC, TRIG, CHOLHDL, LDLDIRECT in the last 72 hours. Thyroid function studies No results for input(s): TSH, T4TOTAL, T3FREE, THYROIDAB in the last 72 hours.  Invalid input(s): FREET3 Anemia work up  No results for input(s): VITAMINB12, FOLATE, FERRITIN, TIBC, IRON, RETICCTPCT in the last 72 hours. Microbiology Recent Results (from the past 240 hour(s))  Culture, blood (routine x 2)     Status: None   Collection Time: 10/24/20  3:02 AM   Specimen: BLOOD  Result Value Ref Range Status   Specimen Description   Final    BLOOD BLOOD LEFT HAND Performed at [facility], [address] [place], [place] [address]    Special Requests   Final    BOTTLES DRAWN AEROBIC ONLY Blood Culture adequate volume Performed at [facility], [address] [place], [place] [address]    Culture   Final    NO GROWTH 7 DAYS Performed at [facility], [address] [place], [place] [address]    Report Status 10/31/2020 FINAL  Final  Culture, blood (routine x 2)     Status: None   Collection Time: 10/24/20  3:02 AM   Specimen: BLOOD  Result Value Ref Range Status   Specimen Description   Final    BLOOD BLOOD RIGHT HAND Performed at [facility], [address] [place], [place] [address]    Special Requests   Final    BOTTLES DRAWN AEROBIC ONLY Blood Culture adequate volume Performed at [facility], [address], [place], [place] [address]    Culture   Final    NO GROWTH 7 DAYS Performed at [facility], [address],  [place], [place] [address]    Report Status 10/31/2020 FINAL  Final  Aerobic Culture w Gram Stain (superficial specimen)     Status: None (Preliminary result)   Collection Time: 10/31/20 11:00 PM   Specimen: Wound  Result Value Ref Range Status   Specimen Description   Final    WOUND Performed at [facility], [address] [place], [place] [address]    Special Requests   Final    NONE Performed at [facility], [address], [place], [place] [address]    Gram Stain   Final    ABUNDANT WBC PRESENT,BOTH PMN AND MONONUCLEAR RARE GRAM NEGATIVE RODS    Culture   Final    CULTURE REINCUBATED FOR BETTER GROWTH Performed at [facility], [address], [place], [place] [address]    Report Status PENDING  Incomplete     Signed: Melina Schools Dahal  [facility] 11/02/2020, 3:50 PM

## 2020-11-02 NOTE — Progress Notes (Signed)
Nutrition Follow-up  DOCUMENTATION CODES:   Not applicable  INTERVENTION:  - continue Osmolite 1.5 @ 50 ml/hr with 45 ml Prosource TF BID and 1 packet Juven BID.  - will order 100 ml free water QID via PEG. - weigh patient today.   NUTRITION DIAGNOSIS:   Increased nutrient needs related to acute illness,wound healing as evidenced by estimated needs. -ongoing  GOAL:   Patient will meet greater than or equal to 90% of their needs -met with TF regimen  MONITOR:   TF tolerance,PO intake,Labs,Weight trends,Skin  ASSESSMENT:   80 year old female with medical history of dementia, HTN, HLD, recently dx of osteomyelitis and hardware infection of the thoracolumbar area, OSA, and multiple decubitus ulcers. She presented to the ED from home due to FTT with minimal PO intake recently. In the ED she was severely hypernatremic. She was a Rapid Response on 3/12 due to likely aspiration event. She was made DNR on 10/22/20. Family had requested PEG placement but due to patient's mental status, baseline, and long-term prognosis it is unclear that a PEG tube would be appropriate.  Significant Events: 3/10- admission 3/12- diet changed from [place] to NPO 3/14- diet re-advanced to [place] 3/15- initial RD assessment 3/17- 48 hour Calorie Count initiation; diet advanced to Dysphagia 3, thin liquids 3/20- NPO resumed at [place] 3/21- PEG placed by IR 3/22- TF initiation   Last RD assessment was on 3/21. The only documented intakes since that time are 0% of lunch and 0% of dinner yesterday.  Patient laying in bed with covers up to her neck. No family or visitors present. She reports not having anything PO so far today d/t not feeling hungry.   PEG in place and she is receiving TF regimen at ordered goal rate: Osmolite 1.5 @ 50 ml/hr with 45 ml Prosource TF BID and 1 packet Juven BID. This regimen is providing 2070 kcal, 102 grams protein, and 914 ml free water.   She denies abdominal pain or pressure, no  overt feelings of fullness, and no nausea.  Patient has not been weighed since admission on 3/10. Mild pitting edema to BLE and non-pitting edema to BUE documented in the edema section of flow sheet.   Palliative Care is following and last talked with patient and family yesterday. Plan at time of d/c will be for no re-hospitalization and to focus on patient's comfort at home.     Labs reviewed; CBGs: 115 and 111 mg/dl, Ca: 7.9 mg/dl, Phos: 1.3 mg/dl. Medications reviewed; 1000 mg ascorbic acid/day, 2.5 mg marinol/day, 325 mg ferrous sulfate/day, 1 tablet multivitamin with minerals/day, 500 mg KPhos neutral x2 doses 3/23 and x2 doses 3/24, 30 mmol IV KPhos x1 run 3/24.   Diet Order:   Diet Order            DIET DYS 3 Room service appropriate? Yes; Fluid consistency: Thin  Diet effective now                 EDUCATION NEEDS:   No education needs have been identified at this time  Skin:  Skin Assessment: Skin Integrity Issues: Skin Integrity Issues:: Stage III,Stage IV,Unstageable Stage III: mid buttocks Stage IV: L buttocks Unstageable: full thickness to vertebral column  Last BM:  3/22 (type 7 x1)  Height:   Ht Readings from Last 1 Encounters:  10/19/20 _0  (1.575 m)    Weight:   Wt Readings from Last 1 Encounters:  10/19/20 61.3 kg     Estimated Nutritional  Needs:  Kcal:  1850-2050 kcal Protein:  95-110 grams Fluid:  >/= 2.2 L/day     Jarome Matin, MS, RD, LDN, CNSC Inpatient Clinical Dietitian RD pager # available in [facility]  After hours/weekend pager # available in [facility]

## 2020-11-03 LAB — CBC
HCT: 25.8 % — ABNORMAL LOW (ref 36.0–46.0)
Hemoglobin: 7.8 g/dL — ABNORMAL LOW (ref 12.0–15.0)
MCH: 32 pg (ref 26.0–34.0)
MCHC: 30.2 g/dL (ref 30.0–36.0)
MCV: 105.7 fL — ABNORMAL HIGH (ref 80.0–100.0)
Platelets: 162 10*3/uL (ref 150–400)
RBC: 2.44 MIL/uL — ABNORMAL LOW (ref 3.87–5.11)
RDW: 18.4 % — ABNORMAL HIGH (ref 11.5–15.5)
WBC: 10.8 10*3/uL — ABNORMAL HIGH (ref 4.0–10.5)
nRBC: 0.6 % — ABNORMAL HIGH (ref 0.0–0.2)

## 2020-11-03 LAB — GLUCOSE, CAPILLARY
Glucose-Capillary: 100 mg/dL — ABNORMAL HIGH (ref 70–99)
Glucose-Capillary: 111 mg/dL — ABNORMAL HIGH (ref 70–99)

## 2020-11-03 LAB — RENAL FUNCTION PANEL
Albumin: 1.7 g/dL — ABNORMAL LOW (ref 3.5–5.0)
Anion gap: 5 (ref 5–15)
BUN: 24 mg/dL — ABNORMAL HIGH (ref 8–23)
CO2: 26 mmol/L (ref 22–32)
Calcium: 8.1 mg/dL — ABNORMAL LOW (ref 8.9–10.3)
Chloride: 107 mmol/L (ref 98–111)
Creatinine, Ser: <0.3 mg/dL — ABNORMAL LOW (ref 0.44–1.00)
Glucose, Bld: 120 mg/dL — ABNORMAL HIGH (ref 70–99)
Phosphorus: 2.8 mg/dL (ref 2.5–4.6)
Potassium: 4.8 mmol/L (ref 3.5–5.1)
Sodium: 138 mmol/L (ref 135–145)

## 2020-11-03 LAB — MAGNESIUM: Magnesium: 1.7 mg/dL (ref 1.7–2.4)

## 2020-11-03 NOTE — TOC Progression Note (Signed)
Transition of Care [facility]) - Progression Note    Patient Details  Name: [name] MRN: [id number] Date of Birth: [birth year]  Transition of Care [facility]) CM/SW Contact  Darleene Cleaver, [place] Phone Number: 11/03/2020, 1:47 PM  Clinical Narrative:     CSW spoke to both [facility], they are not able to accept patient.  CSW spoke to patient's son and informed him of the decision.  Patient's son asked what is next CSW informed him that she can be placed in a SNF for wound care and rehab, go home with home health and palliative following, or go home with hospice.  Per patient's son, he would like CSW to look at SNF placement for patient.  CSW was informed that patient was discharged from [facility] on August 28, 2020.  CSW to begin bed search in [facility], CSW awaiting for bed offers.   Expected Discharge Plan: Skilled Nursing Facility Barriers to Discharge: Continued Medical Work up  Expected Discharge Plan and Services Expected Discharge Plan: Skilled Nursing Facility   Discharge Planning Services: CM Consult   Living arrangements for the past 2 months: Single Family Home Expected Discharge Date: 11/02/20                                     Social Determinants of Health (SDOH) Interventions    Readmission Risk Interventions No flowsheet data found.

## 2020-11-03 NOTE — Progress Notes (Signed)
PROGRESS NOTE  [name]  DOB: 08-03-1941  PCP: Sarah Contras, MD LOV:[id number]  DOA: 10/19/2020  LOS: 15 days   Chief Complaint  Patient presents with  . Wound Check    Brief narrative: [name] is a 80 y.o. female with PMH significant for dementia, recent vertebral osteomyelitis (completed antibiotics on 09/08/2020), multiple decubitus ulcer, DVT/PE on Eliquis, OSA, HTN and HLD. Patient was brought to the ED on 10/19/2020 with failure to thrive.  Noted to have hypernatremia, infected decubitus ulcer with failed outpatient treatment with p.o. antibiotics.  She was admitted and treated with IV antibiotics and hypotonic fluid.   Hospital course was complicated by ICU/stepdown stay for acute hypoxemic respiratory failure due to aspiration pneumonia in the setting of acute metabolic encephalopathy.  CODE STATUS was changed to DNR/DNI per discussion between previous attending and family.  Received 7 days of Vanco and cefepime.  Respiratory failure and encephalopathy resolved.  Antibiotics was de-escalated to p.o. amoxicillin for her chronic thoracolumbar osteomyelitis.  On dysphagia 3 diet per SLP recommendation but p.o. intake remained poor.  PEG tube placed on 3/21.  Thoracolumbar wound seems to be worse with purulent drainage.  See below for details  Subjective: Patient was seen and examined this morning.  Pleasant elderly African-American female.  Lying down in bed. Not in distress. Family not at bedside.  I spoke with yesterday.  Assessment/Plan: Infected multiple decubitus ulcer, POA -CT of the abdomen pelvis does not show any acute osteomyelitis at this time. 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 Now with pocket of purulent fluid -Chronic decubitus ulcer over coccyx and left buttock seems stable -Initial blood culture contaminated with staph  auricularis in aerobic bottle.  Repeat blood culture NGTD -Vanco and cefepime 3/10-3/16>> p.o. Amoxil 3/16>> for 4 weeks, and outpt f/up in clinic in 4 weeks per ID. -Follow wound culture from thoracolumbar wound -3/22, repeat CT scan of lumbar spine was obtained which did not show any convincing evidence of acute osteomyelitis.  Acute hypoxic respiratory failure 2/2 aspiration event, resolving -CTA chest on 3/12 with complete collapse of RLL with large debris's in LLL bronchus  intermedius -Continue aspiration precautions -Antibiotics as above.  Acute metabolic encephalopathy Chronic dementia without behavioral disturbance -Oriented to self, place and person with seem to be her baseline. -Discontinued Aricept in the setting of bradycardia -Continue Namenda -Reorientation and delirium precautions  Sinus bradycardia:  -HR improved after Aricept was held  Hypovolemic hypernatremia: -Likely due to poor p.o. intake.  Resolved. -Continue monitoring  Hypokalemia/hypophosphatemia/hypomagnesemia:  -Labs improved with replacement. Recent Labs  Lab 10/30/20 0100 10/31/20 0327 11/01/20 0500 11/02/20 0445 11/03/20 0603  K 3.7 3.0* 3.3* 4.2 4.8  MG 2.0 1.8 1.9 1.7 1.7  PHOS 2.2* 3.1 2.2* 1.3*  1.4* 2.8   Essential hypertension: Normotensive. 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 -SCD for VTE prophylaxis  Normocytic anemia -Gradual drop in H&H but seems to be stable now between 7 and 8. No report of melena or hematochezia. Anemia panel consistent with ACD.  Could be due to poor nutrition. Recent Labs    03/09/20 1011 05/05/20 1429 07/29/20 0500 07/29/20 0908 07/29/20 1520 10/29/20 1441 10/30/20 0100 10/30/20 1310 10/31/20 0327 11/01/20 0500 11/02/20 0445 11/03/20 0603  HGB  --    < > 7.7*  7.6*   < >  --    < > 7.9* 7.8* 7.6* 7.3* 7.8*  MCV  --     < > 94.7 95.6   < >  --    < >  --  102.0* 102.1* 103.5* 105.7*  VITAMINB12 754  --  743  --   --  671  --   --   --   --   --   --   FOLATE 12.6  --   --   --   --  7.6  --   --   --   --   --   --   FERRITIN  --   --   --   --   --  153  --   --   --   --   --   --   TIBC  --   --  115*  --   --  92*  --   --   --   --   --   --   IRON  --   --  16*  --   --  52  --   --   --   --   --   --   RETICCTPCT  --   --   --  3.1  --  3.2*  --   --   --   --   --   --    < > = values in this interval not displayed.   Recurrent hypoglycemia: Likely due to poor p.o. intake.  Not on insulin.  Glucose level is stable at this time.   Recent Labs  Lab 11/01/20 1648 11/02/20 0023 11/02/20 1136 11/03/20 0008 11/03/20 1212  GLUCAP 106* 115* 111* 111* 100*   Mobility: Encourage ambulation Code Status:   Code Status: DNR  Nutritional status: Body mass index is 24.72 kg/m. Nutrition Problem: Increased nutrient needs Etiology: acute illness,wound healing Signs/Symptoms: estimated needs Diet Order            Diet general           DIET DYS 3 Room service appropriate? Yes; Fluid consistency: Thin  Diet effective now                 DVT prophylaxis: Place and maintain sequential compression device Start: 10/30/20 0722 SCD's Start: 10/29/20 1605 apixaban (ELIQUIS) tablet 5 mg   Antimicrobials:  Amoxicillin Fluid: Normal saline Consultants: Palliative care Family Communication:  None at bedside  Status is: Inpatient  Remains inpatient appropriate because: Pending SNF placement   Dispo: The patient is from: Home              Anticipated d/c is to: SNF.              Patient currently is not medically stable to d/c.   Difficult to place patient No       Infusions:  . sodium chloride 10 mL/hr at 10/25/20 0451  . feeding supplement (OSMOLITE 1.5 CAL) 50 mL/hr at 10/31/20 2316    Scheduled Meds: . amoxicillin  500 mg Oral Q8H  . apixaban  5 mg Oral BID  . busPIRone  15 mg  Oral BID  . Chlorhexidine Gluconate Cloth  6 each Topical Daily  . feeding supplement  1 Container Oral BID BM  . feeding supplement (PROSource TF)  45 mL Per Tube BID  . free water  100 mL Per Tube Q6H  . linaclotide  145 mcg Oral Daily  . mouth rinse  15 mL Mouth Rinse BID  . memantine  28 mg Oral QHS  . nutrition supplement (JUVEN)  1 packet Per Tube BID BM  . sodium chloride flush  10-40 mL Intracatheter Q12H    Antimicrobials: Anti-infectives (From admission, onward)   Start     Dose/Rate Route Frequency Ordered Stop   11/02/20 0000  amoxicillin (AMOXIL) 500 MG capsule        500 mg Oral Every 8 hours 11/02/20 1411 11/25/20 2359   10/30/20 1200  ceFAZolin (ANCEF) IVPB 2g/100 mL premix        2 g 200 mL/hr over 30 Minutes Intravenous On call to O.R. 10/29/20 1604 10/31/20 0824   10/26/20 0930  amoxicillin (AMOXIL) capsule 500 mg        500 mg Oral Every 8 hours 10/26/20 0833 11/23/20 0559   10/21/20 2200  vancomycin (VANCOREADY) IVPB 750 mg/150 mL  Status:  Discontinued        750 mg 150 mL/hr over 60 Minutes Intravenous Every 24 hours 10/21/20 0739 10/26/20 0833   10/20/20 2200  vancomycin (VANCOREADY) IVPB 1250 mg/250 mL  Status:  Discontinued        1,250 mg 166.7 mL/hr over 90 Minutes Intravenous Every 24 hours 10/19/20 2253 10/21/20 0739   10/19/20 2315  ceFEPIme (MAXIPIME) 2 g in sodium chloride 0.9 % 100 mL IVPB  Status:  Discontinued        2 g 200 mL/hr over 30 Minutes Intravenous Every 12 hours 10/19/20 2226 10/26/20 0833   10/19/20 2300  vancomycin (VANCOREADY) IVPB 1000 mg/200 mL        1,000 mg 200 mL/hr over 60 Minutes Intravenous  Once 10/19/20 2209 10/20/20 0135   10/19/20 2300  ceFEPIme (MAXIPIME) 2 g in sodium chloride 0.9 % 100 mL IVPB  Status:  Discontinued        2 g 200 mL/hr over 30 Minutes Intravenous  Once 10/19/20 2209 10/19/20 2226   10/19/20 1615  ceFAZolin (ANCEF) IVPB 1 g/50 mL premix        1 g 100 mL/hr over 30 Minutes Intravenous  Once  10/19/20 1614 10/19/20 1931      PRN meds: sodium chloride, acetaminophen, nitroGLYCERIN, oxyCODONE, sodium chloride flush   Objective: Vitals:   11/03/20 0509 11/03/20 1247  BP: (!) 111/51 134/66  Pulse: 73 65  Resp: 14 18  Temp: 98.3 F (36.8 C) 97.8 F (36.6 C)  SpO2: 94% 97%    Intake/Output Summary (Last 24 hours) at 11/03/2020 1449 Last data filed at 11/02/2020 1618 Gross per 24 hour  Intake 1128.74 ml  Output --  Net 1128.74 ml   Filed Weights   10/19/20 2107  Weight: 61.3 kg   Weight change:  Body mass index is 24.72 kg/m.   Physical Exam: General exam: Elderly African-American female.  Lying down in bed.  Not in distress.  No new symptoms Skin: No rashes, lesions or ulcers. HEENT: Atraumatic, normocephalic, no obvious bleeding Lungs: Clear to auscultation bilaterally CVS: Regular rate and rhythm, no murmur GI/Abd soft, nontender, nondistended, bowel sound present CNS: Alert, awake, knows she is in the hospital. Psychiatry: Depressed look Extremities: No pedal edema, no calf tenderness  Data Review: I have personally reviewed the laboratory data and studies available.  Recent Labs  Lab 10/30/20 0100 10/30/20 1310 10/31/20 0327 11/01/20 0500 11/02/20 0445 11/03/20 0603  WBC 9.7  --  9.5 9.5 9.4 10.8*  HGB 7.2* 7.9*  7.8* 7.6* 7.3* 7.8*  HCT 23.4* 26.6* 25.5* 24.4* 23.8* 25.8*  MCV 103.5*  --  102.0* 102.1* 103.5* 105.7*  PLT 171  --  172 154 161 162   Recent Labs  Lab 10/30/20 0100 10/31/20 0327 11/01/20 0500 11/02/20 0445 11/03/20 0603  NA 140 138 140 139 138  K 3.7 3.0* 3.3* 4.2 4.8  CL 114* 110 110 109 107  CO2 23 23 24 24 26   GLUCOSE 78 90 179* 142* 120*  BUN 16 11 10 18  24*  CREATININE 0.43* 0.31* 0.32* 0.45 <0.30*  CALCIUM 8.4* 8.2* 8.0* 7.9* 8.1*  MG 2.0 1.8 1.9 1.7 1.7  PHOS 2.2* 3.1 2.2* 1.3*  1.4* 2.8    F/u labs ordered Unresulted Labs (From admission, onward)          Start     Ordered   10/31/20 0500  Renal  function panel  Daily,   R     Question:  Specimen collection method  Answer:  IV Team=IV Team collect   10/30/20 1515   10/31/20 0500  Magnesium  Daily,   R     Question:  Specimen collection method  Answer:  IV Team=IV Team collect   10/30/20 1515   10/30/20 0722  Occult blood card to lab, stool  Once,   R        10/30/20 0721   10/29/20 0730  CBC  Daily,   R     Question:  Specimen collection method  Answer:  IV Team=IV Team collect   10/28/20 2327          Signed, 10/30/20, MD [facility] 11/03/2020

## 2020-11-03 NOTE — NC FL2 (Signed)
MEDICAID FL2 LEVEL OF CARE SCREENING TOOL     IDENTIFICATION  Patient Name: [name] Birthdate: 1941-03-28 Sex: female Admission Date (Current Location): 10/19/2020  [facility] and [place] Number:  Producer, television/film/video and Address:  [facility],  [address] [place], [place] [address]      Provider Number: [id number]  Attending Physician Name and Address:  Lorin Glass, MD  Relative Name and Phone Number:  Glennis Brink [phone]    Decker,Marilyn Other [phone]    Cooper,Carolyn Niece [phone]    Current Level of Care: Hospital Recommended Level of Care: Skilled Nursing Facility Prior Approval Number:    Date Approved/Denied:   PASRR Number: [id number]  Discharge Plan: SNF    Current Diagnoses: Patient Active Problem List   Diagnosis Date Noted  . Cellulitis 10/19/2020  . Hypernatremia 10/19/2020  . Pressure injury of skin with infection 10/19/2020  . Right knee pain 08/03/2020  . Anemia 08/01/2020  . Osteomyelitis (HCC) 07/23/2020  . Hardware complicating wound infection (HCC) 07/23/2020  . Back pain   . Pressure injury of skin   . Hypocalcemia 07/21/2020  . Palliative care by specialist   . Goals of care, counseling/discussion   . Protein-calorie malnutrition, severe 05/25/2020  . Acute renal failure (ARF) (HCC) 05/24/2020  . 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 Hyperkalemia   . Edema of left upper extremity   . Hematoma   . Prolonged Q-T interval on ECG   . Acute blood loss anemia   . Transaminitis   . Chronic bilateral low back pain without sciatica   . Debility 05/13/2020  . Cellulitis of right hip 05/10/2020  . Elevated troponin 05/05/2020  . Near syncope 05/05/2020  . Pulmonary embolism (HCC) 05/05/2020  . Macrocytic anemia   . Hypokalemia   . Labile blood pressure   . Drug-induced hypotension   . History of hypertension   . First degree AV block    . Sleep disturbance   . Drug induced constipation   . Stage 2 chronic kidney disease   . Dysphagia, post-stroke   . Benign essential HTN   . Slow transit constipation   . 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 Allergic reaction caused by a drug (tPA w/ lip swelling) 02/21/2020  . 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 Dementia (HCC) 02/21/2020  . Bradycardia 02/21/2020  . AKI (acute kidney injury) (HCC) 02/21/2020  . 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 Dyslipidemia   . History of DVT (deep vein thrombosis)   . OSA (obstructive sleep apnea)   . Leukocytosis   . Stroke [facility]) L>R pontine and L cerebellar embolic infarcts, source unknown s/p tPA 02/16/2020  . Hx-TIA (transient ischemic attack)   . Hyperlipemia   . Hypertension   . Sleep apnea   . History of pulmonary embolus (PE)   . NSTEMI (non-ST elevated myocardial infarction) (HCC) 09/16/2019  . 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 Depression 07/29/2018  . Acute massive pulmonary embolism (HCC) 09/29/2016  . Acute encephalopathy 09/28/2016  . Left knee pain 09/28/2016  . Right leg swelling 09/28/2016  . Hilar mass 09/28/2016  . OSA on CPAP 09/28/2016  . HTN (hypertension) 09/28/2016  . Chronic pain 09/28/2016  . Cellulitis of left leg 06/14/2016  . Osteoarthritis of left knee 05/17/2016  . 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    Orientation RESPIRATION BLADDER Height & Weight  Self,Place  Normal Incontinent Weight: 135 lb 2.3 oz (61.3 kg) Height:  5\' 2"  (157.5 cm)  BEHAVIORAL SYMPTOMS/MOOD NEUROLOGICAL BOWEL NUTRITION STATUS      Incontinent Feeding tube  AMBULATORY STATUS COMMUNICATION OF NEEDS Skin   Limited Assist Verbally PU Stage and Appropriate Care     PU Stage 3 Dressing: Daily PU Stage 4 Dressing: Daily               Personal Care Assistance Level of Assistance  Bathing,Feeding,Dressing Bathing Assistance: Limited  assistance Feeding assistance: Limited assistance Dressing Assistance: Limited assistance     Functional Limitations Info  Sight,Speech,Hearing Sight Info: Adequate Hearing Info: Adequate Speech Info: Adequate    SPECIAL CARE FACTORS FREQUENCY  PT (By licensed PT),OT (By licensed OT)     PT Frequency: Minimum 5x a week OT Frequency: Minimum 5x a week            Contractures Contractures Info: Present    Additional Factors Info  Code Status,Allergies,Psychotropic,Isolation Precautions Code Status Info: DNR Allergies Info: T-pa and Codeine Psychotropic Info: busPIRone (BUSPAR) tablet 15 mg   Isolation Precautions Info: MRSA     Current Medications (11/03/2020):  This is the current hospital active medication list Current Facility-Administered Medications  Medication Dose Route Frequency Provider Last Rate Last Admin  . 0.9 %  sodium chloride infusion   Intravenous PRN 11/05/2020, MD 10 mL/hr at 10/25/20 0451 Infusion Verify at 10/25/20 0451  . acetaminophen (TYLENOL) tablet 650 mg  650 mg Oral Q6H PRN 10/27/20, MD   650 mg at 10/25/20 1030  . amoxicillin (AMOXIL) capsule 500 mg  500 mg Oral Q8H 10/27/20, MD   500 mg at 11/03/20 0600  . apixaban (ELIQUIS) tablet 5 mg  5 mg Oral BID [place]ade, 11/05/20, RPH   5 mg at 11/03/20 1123  . busPIRone (BUSPAR) tablet 15 mg  15 mg Oral BID 11/05/20, MD   15 mg at 11/03/20 1122  . Chlorhexidine Gluconate Cloth 2 % PADS 6 each  6 each Topical Daily 11/05/20, MD   6 each at 11/03/20 1123  . feeding supplement (BOOST / RESOURCE BREEZE) liquid 1 Container  1 Container Oral BID BM 11/05/20, MD   1 Container at 11/03/20 1121  . feeding supplement (OSMOLITE 1.5 CAL) liquid 1,000 mL  1,000 mL Per Tube Continuous 11/05/20, MD 50 mL/hr at 10/31/20 2316 Rate Change at 10/31/20 2316  . feeding supplement (PROSource TF) liquid 45 mL  45 mL Per Tube BID 11/02/20 T, MD   45 mL at 11/03/20  1122  . free water 100 mL  100 mL Per Tube Q6H Dahal, Binaya, MD   100 mL at 11/03/20 1123  . linaclotide (LINZESS) capsule 145 mcg  145 mcg Oral Daily 11/05/20, MD   145 mcg at 11/03/20 0600  . MEDLINE mouth rinse  15 mL Mouth Rinse BID 11/05/20, MD   15 mL at 11/03/20 1123  . memantine (NAMENDA XR) 24 hr capsule 28 mg  28 mg Oral QHS 11/05/20, MD   28 mg at 11/02/20 2144  . nitroGLYCERIN (NITROSTAT) SL tablet 0.4 mg  0.4 mg Sublingual Q5 min PRN 2145, MD      . nutrition supplement (JUVEN) (JUVEN) powder packet 1 packet  1 packet Per Tube BID BM Eduard Clos, MD   1 packet at 11/03/20 1122  . oxyCODONE (Oxy IR/ROXICODONE)  immediate release tablet 5 mg  5 mg Oral Q6H PRN Candelaria Stagers T, MD   5 mg at 11/03/20 1219  . sodium chloride flush (NS) 0.9 % injection 10-40 mL  10-40 mL Intracatheter Q12H Azucena Fallen, MD   10 mL at 11/02/20 2145  . sodium chloride flush (NS) 0.9 % injection 10-40 mL  10-40 mL Intracatheter PRN Azucena Fallen, MD         Discharge Medications: Please see discharge summary for a list of discharge medications.  Relevant Imaging Results:  Relevant Lab Results:   Additional Information SSN [id number]  Darleene Cleaver, LCSW

## 2020-11-04 LAB — AEROBIC CULTURE W GRAM STAIN (SUPERFICIAL SPECIMEN)

## 2020-11-04 LAB — GLUCOSE, CAPILLARY
Glucose-Capillary: 126 mg/dL — ABNORMAL HIGH (ref 70–99)
Glucose-Capillary: 129 mg/dL — ABNORMAL HIGH (ref 70–99)
Glucose-Capillary: 113 mg/dL — ABNORMAL HIGH (ref 70–99)

## 2020-11-04 LAB — MAGNESIUM: Magnesium: 1.6 mg/dL — ABNORMAL LOW (ref 1.7–2.4)

## 2020-11-04 LAB — CBC
HCT: 23.9 % — ABNORMAL LOW (ref 36.0–46.0)
Hemoglobin: 7.2 g/dL — ABNORMAL LOW (ref 12.0–15.0)
MCH: 32.4 pg (ref 26.0–34.0)
MCHC: 30.1 g/dL (ref 30.0–36.0)
MCV: 107.7 fL — ABNORMAL HIGH (ref 80.0–100.0)
Platelets: 163 10*3/uL (ref 150–400)
RBC: 2.22 MIL/uL — ABNORMAL LOW (ref 3.87–5.11)
RDW: 18.2 % — ABNORMAL HIGH (ref 11.5–15.5)
WBC: 9.1 10*3/uL (ref 4.0–10.5)
nRBC: 1.3 % — ABNORMAL HIGH (ref 0.0–0.2)

## 2020-11-04 LAB — RENAL FUNCTION PANEL
Albumin: 1.6 g/dL — ABNORMAL LOW (ref 3.5–5.0)
Anion gap: 5 (ref 5–15)
BUN: 26 mg/dL — ABNORMAL HIGH (ref 8–23)
CO2: 26 mmol/L (ref 22–32)
Calcium: 8.3 mg/dL — ABNORMAL LOW (ref 8.9–10.3)
Chloride: 107 mmol/L (ref 98–111)
Creatinine, Ser: <0.3 mg/dL — ABNORMAL LOW (ref 0.44–1.00)
Glucose, Bld: 131 mg/dL — ABNORMAL HIGH (ref 70–99)
Phosphorus: 3 mg/dL (ref 2.5–4.6)
Potassium: 4.8 mmol/L (ref 3.5–5.1)
Sodium: 138 mmol/L (ref 135–145)

## 2020-11-04 NOTE — Discharge Summary (Signed)
Physician Discharge Summary  [name]:[id number] DOB: 08/17/1940 DOA: 10/19/2020  PCP: [name], MD  Admit date: 10/19/2020 Discharge date: 11/04/2020  Admitted From: Home Discharge disposition: home with home health   Code Status: DNR  Diet Recommendation: Dysphagia 3 diet as tolerated Discharge Diagnosis:   Principal Problem:   Pressure injury of skin with infection Active Problems:   HTN (hypertension)   Takotsubo cardiomyopathy   OSA (obstructive sleep apnea)   Cellulitis   Hypernatremia  Chief Complaint  Patient presents with  . Wound Check    Brief narrative: Sarah WHITCOMB is a 80 y.o. [name] with PMH significant for dementia, recent vertebral osteomyelitis (completed antibiotics on 09/08/2020), multiple decubitus ulcer, DVT/PE on Eliquis, OSA, HTN and HLD. Patient was brought to the ED on 10/19/2020 with failure to thrive.  Noted to have hypernatremia, infected decubitus ulcer with failed outpatient treatment with p.o. antibiotics.  She was admitted and treated with IV antibiotics and hypotonic fluid.   Hospital course was complicated by ICU/stepdown stay for acute hypoxemic respiratory failure due to aspiration pneumonia in the setting of acute metabolic encephalopathy.  CODE STATUS was changed to DNR/DNI per discussion between previous attending and family.  Received 7 days of Vanco and cefepime.  Respiratory failure and encephalopathy resolved.  Antibiotics was de-escalated to p.o. amoxicillin for her chronic thoracolumbar osteomyelitis.  On dysphagia 3 diet per SLP recommendation but p.o. intake remained poor.  PEG tube placed on 3/21.  Thoracolumbar wound seems to be worse with purulent drainage.   Subjective: Patient was seen and examined this morning. [name]. Lying down in bed. Not in distress.  Assessment/Plan: Infected multiple decubitus ulcer, POA -CT of the abdomen pelvis does not show any acute  osteomyelitis at this time. 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 Repeat blood culture NGTD. -Vanco and cefepime 3/10-3/16>> p.o. Amoxil 3/16>> for 4 weeks, and outpt f/up in clinic in 4 weeks per ID. -3/22, repeat CT scan of lumbar spine was obtained which did not show any convincing evidence of acute osteomyelitis.  Acute hypoxic respiratory failure 2/2 aspiration event, resolving -CTA chest on 3/12 with complete collapse of RLL with large debris's in LLL bronchus  intermedius -Continue aspiration precautions -Antibiotics as above.   Acute metabolic encephalopathy Chronic dementia without behavioral disturbance -Oriented to self, place and person with seem to be her baseline. -Discontinued Aricept in the setting of bradycardia -Continue Namenda. -Reorientation and delirium precautions.  Sinus bradycardia:  -HR improved after Aricept was held.   Hypovolemia/hypernatremia: -Likely due to poor p.o. intake. Resolved. -Continue monitoring  Hypokalemia/hypophosphatemia/hypomagnesemia:  -Labs this morning with potassium low at 3.3 and phosphorus low at 2.2. Replacement given.. Recent Labs  Lab 10/31/20 0327 11/01/20 0500 11/02/20 0445 11/03/20 0603 11/04/20 0501  K 3.0* 3.3* 4.2 4.8 4.8  MG 1.8 1.9 1.7 1.7 1.6*  PHOS 3.1 2.2* 1.3*  1.4* 2.8 3.0   Essential hypertension:  -Normotensive.  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 No report of melena or hematochezia. -Anemia  panel  consistent with ACD.  Could be due to poor nutrition. Recent Labs    03/09/20 1011 05/05/20 1429 07/29/20 0500 07/29/20 0908 07/29/20 1520 10/29/20 1441 10/30/20 0100 10/31/20 0327 11/01/20 0500 11/02/20 0445 11/03/20 0603 11/04/20 0501  HGB  --    < > 7.7* 7.6*   < >  --    < > 7.8* 7.6* 7.3* 7.8* 7.2*  MCV  --    < > 94.7 95.6   < >  --    < > 102.0* 102.1* 103.5* 105.7* 107.7*  VITAMINB12 754  --  743  --   --  671  --   --   --   --   --   --   FOLATE 12.6  --   --   --   --  7.6  --   --   --   --   --   --   FERRITIN  --   --   --   --   --  153  --   --   --   --   --   --   TIBC  --   --  115*  --   --  92*  --   --   --   --   --   --   IRON  --   --  16*  --   --  52  --   --   --   --   --   --   RETICCTPCT  --   --   --  3.1  --  3.2*  --   --   --   --   --   --    < > = values in this interval not displayed.   Recurrent hypoglycemia:  -Likely due to poor p.o. intake.  Not on insulin.    Glucose level stable.  Recent Labs  Lab 11/02/20 0609 11/02/20 1136 11/03/20 0008 11/03/20 1212 11/04/20 0540  GLUCAP 126* 111* 111* 100* 129*    Wound care: Wound / Incision (Open or Dehisced) 05/27/20 (MASD) Moisture Associated Skin Damage Perineum Right;Left (Active)  Date First Assessed/Time First Assessed: 05/27/20 0610   Wound Type: (MASD) Moisture Associated Skin Damage  Location: Perineum  Location Orientation: Right;Left    Assessments 05/27/2020  6:10 AM 05/29/2020 10:31 PM  Dressing Type - None  Site / Wound Assessment - Clean;Dry  Treatment Other (Comment) Cleansed;Other (Comment)     No Linked orders to display     Pressure Injury 10/19/20 Vertebral column Left;Mid Unstageable - Full thickness tissue loss in which the base of the injury is covered by slough (yellow, tan, gray, green or brown) and/or eschar (tan, brown or black) in the wound bed. eschar d/t removal  (Active)  Date First Assessed/Time First Assessed: 10/19/20 2020   Location: Vertebral  column  Location Orientation: Left;Mid  Staging: Unstageable - Full thickness tissue loss in which the base of the injury is covered by slough (yellow, tan, gray, green or br...    Assessments 10/24/2020 11:45 AM 11/04/2020  8:21 AM  Dressing Type Santyl;Foam - Lift dressing to assess site every shift -  Dressing Changed;Clean;Intact;New drainage -  Dressing Change Frequency Daily -  State of Healing Eschar -  Site / Wound Assessment - Dressing in place / Unable to assess  Peri-wound Assessment Erythema (non-blanchable);Pink -  Wound Length (cm) 6.5 cm -  Wound Width (cm) 5 cm -  Wound  Depth (cm) 0 cm -  Wound Surface Area (cm^2) 32.5 cm^2 -  Wound Volume (cm^3) 0 cm^3 -  Drainage Amount Minimal -  Drainage Description Purulent -  Treatment Cleansed -     No Linked orders to display     Pressure Injury 10/19/20 Buttocks Left Stage 4 - Full thickness tissue loss with exposed bone, tendon or muscle. 7x2x3, tunneling at 12'oclock-6cm, 9'oclock-1.5cm. (Active)  Date First Assessed/Time First Assessed: 10/19/20 2020   Location: Buttocks  Location Orientation: Left  Staging: Stage 4 - Full thickness tissue loss with exposed bone, tendon or muscle.  Wound Description (Comments): 7x2x3, tunneling at 12'oclock-6c...    Assessments 10/24/2020 11:45 AM 11/04/2020  8:21 AM  Dressing Type Foam - Lift dressing to assess site every shift;Moist to dry -  Dressing Changed -  Dressing Change Frequency Daily -  Site / Wound Assessment - Dressing in place / Unable to assess  Margins Attached edges (approximated) -  Drainage Amount Scant -  Drainage Description Purulent;Serosanguineous -  Treatment Cleansed;Packing (Saline gauze) -     No Linked orders to display     Pressure Injury 10/19/20 Buttocks Mid Stage 3 -  Full thickness tissue loss. Subcutaneous fat may be visible but bone, tendon or muscle are NOT exposed. intragluteal cluster, 8.5x5 (Active)  Date First Assessed/Time First Assessed: 10/19/20  2020   Location: Buttocks  Location Orientation: Mid  Staging: Stage 3 -  Full thickness tissue loss. Subcutaneous fat may be visible but bone, tendon or muscle are NOT exposed.  Wound Description (Com...    Assessments 10/24/2020 11:45 AM 11/04/2020  8:21 AM  Dressing Type Foam - Lift dressing to assess site every shift -  Dressing Changed;Clean;Intact -  Dressing Change Frequency Daily -  State of Healing Epithelialized -  Site / Wound Assessment Clean;Dry;Pink;Red;Yellow Dressing in place / Unable to assess  Peri-wound Assessment Intact;Erythema (non-blanchable) -  Margins Attached edges (approximated) -  Drainage Amount None -  Treatment Cleansed -     No Linked orders to display    Discharge Exam:   Vitals:   11/03/20 0509 11/03/20 1247 11/03/20 2230 11/04/20 0538  BP: (!) 111/51 134/66 116/63 125/61  Pulse: 73 65 92 85  Resp: 14 18 16 17   Temp: 98.3 F (36.8 C) 97.8 F (36.6 C) 99.9 F (37.7 C) 99 F (37.2 C)  TempSrc: Oral  Oral Oral  SpO2: 94% 97% 90% 94%  Weight:      Height:        Body mass index is 24.72 kg/m.  General exam: Elderly African-American [name].  Lying down in bed.  Not in distress.  Slow to respond Skin: No rashes, lesions or ulcers. HEENT: Atraumatic, normocephalic, no obvious bleeding Lungs: Clear to auscultation bilaterally CVS: Regular rate and rhythm, no murmur GI/Abd soft, nontender, nondistended, bowel sound present, PEG tube site intact CNS: Alert, awake, oriented to place Psychiatry: Depressed look Extremities: No pedal edema, no calf tenderness  Follow ups:   Discharge Instructions    Diet general   Complete by: As directed    Tube feeding   Discharge wound care:   Complete by: As directed    Cleanse wounds to spine and sacrum/left buttocks with NS and pat dry.  Apply Santyl to open wounds.  Cover wth NS moist gauze.  Secure with foam dressing.   Will order low air loss bed Will offload heels with PRevalon boots   Discharge  wound care:   Complete by: As  directed    Cleanse wounds to spine and sacrum/left buttocks with NS and pat dry.  Apply Santyl to open wounds.  Cover wth NS moist gauze.  Secure with foam dressing.   Will order low air loss bed Will offload heels with PRevalon boots   Increase activity slowly   Complete by: As directed    Increase activity slowly   Complete by: As directed       Follow-up Information    [name], MD Follow up.   Specialty: Internal Medicine Contact information: [address] [place] [address] [phone]        Jodelle Red, MD .   Specialty: Cardiology Contact information: [address] [place] [address] [place] [address] [phone]        [facility] Follow up.   Why: agency will provide home health nurse, physical therapy, aide, social work Contact information: [address] [place] [address] [phone]               Recommendations for Outpatient Follow-Up:   1. Follow-up with PCP as an outpatient  Discharge Instructions:  Follow with Primary MD [name], MD in 7 days   Get CBC/BMP checked in next visit within 1 week by PCP or SNF MD ( we routinely change or add medications that can affect your baseline labs and fluid status, therefore we recommend that you get the mentioned basic workup next visit with your PCP, your PCP may decide not to get them or add new tests based on their clinical decision)  On your next visit with your PCP, please Get Medicines reviewed and adjusted.  Please request your PCP  to go over all Hospital Tests and Procedure/Radiological results at the follow up, please get all Hospital records sent to your Prim MD by signing hospital release before you go home.  Activity: As tolerated with Full fall precautions use walker/cane & assistance as needed  For Heart failure patients - Check your Weight same time everyday, if you gain over 2 pounds, or  you develop in leg swelling, experience more shortness of breath or chest pain, call your Primary MD immediately. Follow Cardiac Low Salt Diet and 1.5 lit/day fluid restriction.  If you have smoked or chewed Tobacco in the last 2 yrs please stop smoking, stop any regular Alcohol  and or any Recreational drug use.  If you experience worsening of your admission symptoms, develop shortness of breath, life threatening emergency, suicidal or homicidal thoughts you must seek medical attention immediately by calling 911 or calling your MD immediately  if symptoms less severe.  You Must read complete instructions/literature along with all the possible adverse reactions/side effects for all the Medicines you take and that have been prescribed to you. Take any new Medicines after you have completely understood and accpet all the possible adverse reactions/side effects.   Do not drive, operate heavy machinery, perform activities at heights, swimming or participation in water activities or provide baby sitting services if your were admitted for syncope or siezures until you have seen by Primary MD or a Neurologist and advised to do so again.  Do not drive when taking Pain medications.  Do not take more than prescribed Pain, Sleep and Anxiety Medications  Wear Seat belts while driving.   Please note You were cared for by a hospitalist during your hospital stay. If you have any questions about your discharge medications or the care you received while you were in the  hospital after you are discharged, you can call the unit and asked to speak with the hospitalist on call if the hospitalist that took care of you is not available. Once you are discharged, your primary care physician will handle any further medical issues. Please note that NO REFILLS for any discharge medications will be authorized once you are discharged, as it is imperative that you return to your primary care physician (or establish a relationship  with a primary care physician if you do not have one) for your aftercare needs so that they can reassess your need for medications and monitor your lab values.    Allergies as of 11/04/2020      Reactions   T-pa [alteplase] Swelling   Does okay with it if premedicated with benadryl.   Codeine Itching      Medication List    STOP taking these medications   amLODipine 10 MG tablet Commonly known as: NORVASC   furosemide 40 MG tablet Commonly known as: LASIX   lisinopril 5 MG tablet Commonly known as: ZESTRIL     TAKE these medications   acetaminophen 325 MG tablet Commonly known as: TYLENOL Take 2 tablets (650 mg total) by mouth every 4 (four) hours as needed for mild pain or moderate pain (or Fever >/= 101).   amoxicillin 500 MG capsule Commonly known as: AMOXIL Take 1 capsule (500 mg total) by mouth every 8 (eight) hours for 23 days.   atorvastatin 20 MG tablet Commonly known as: LIPITOR Take 20 mg by mouth every evening.   busPIRone 30 MG tablet Commonly known as: BUSPAR Take 15 mg by mouth 2 (two) times daily.   cholecalciferol 25 MCG (1000 UNIT) tablet Commonly known as: VITAMIN D3 Take 1,000 Units by mouth daily.   donepezil 10 MG tablet Commonly known as: ARICEPT Take 1 tablet (10 mg total) by mouth at bedtime.   dronabinol 2.5 MG capsule Commonly known as: MARINOL Take 2.5 mg by mouth at bedtime.   Eliquis 5 MG Tabs tablet Generic drug: apixaban Take 5 mg by mouth 2 (two) times daily.   feeding supplement Liqd Take 237 mLs by mouth 3 (three) times daily between meals. What changed: Another medication with the same name was added. Make sure you understand how and when to take each.   nutrition supplement (JUVEN) Pack Place 1 packet into feeding tube 2 (two) times daily between meals. What changed: You were already taking a medication with the same name, and this prescription was added. Make sure you understand how and when to take each.   feeding  supplement (PROSource TF) liquid Place 45 mLs into feeding tube 2 (two) times daily. What changed: You were already taking a medication with the same name, and this prescription was added. Make sure you understand how and when to take each.   feeding supplement (OSMOLITE 1.5 CAL) Liqd Place 1,000 mLs into feeding tube continuous. What changed: You were already taking a medication with the same name, and this prescription was added. Make sure you understand how and when to take each.   ferrous sulfate 325 (65 FE) MG tablet Take 1 tablet (325 mg total) by mouth daily with breakfast.   free water Soln Place 100 mLs into feeding tube every 6 (six) hours.   Linzess 145 MCG Caps capsule Generic drug: linaclotide Take 145 mcg by mouth daily.   memantine 28 MG Cp24 24 hr capsule Commonly known as: NAMENDA XR Take 1 capsule (28 mg total) by mouth  daily. What changed: when to take this   MULTI-VITAMIN GUMMIES PO Take 1 each by mouth daily.   nitroGLYCERIN 0.4 MG SL tablet Commonly known as: NITROSTAT Place 1 tablet (0.4 mg total) under the tongue every 5 (five) minutes as needed for chest pain.   oxyCODONE 15 MG immediate release tablet Commonly known as: ROXICODONE Take 7.5-15 mg by mouth 4 (four) times daily as needed for pain.   polyethylene glycol 17 g packet Commonly known as: MIRALAX / GLYCOLAX Take 17 g by mouth 2 (two) times daily.   potassium chloride 10 MEQ tablet Commonly known as: KLOR-CON Take 10 mEq by mouth daily.   prochlorperazine 10 MG tablet Commonly known as: COMPAZINE Take 10 mg by mouth every 6 (six) hours as needed for nausea or vomiting.   Restasis 0.05 % ophthalmic emulsion Generic drug: cycloSPORINE Place 1 drop into both eyes daily as needed (dry eyes).   vitamin C 1000 MG tablet Take 1,000 mg by mouth daily.            Discharge Care Instructions  (From admission, onward)         Start     Ordered   11/04/20 0000  Discharge wound care:        Comments: Cleanse wounds to spine and sacrum/left buttocks with NS and pat dry.  Apply Santyl to open wounds.  Cover wth NS moist gauze.  Secure with foam dressing.   Will order low air loss bed Will offload heels with PRevalon boots   11/04/20 1147   11/02/20 0000  Discharge wound care:       Comments: Cleanse wounds to spine and sacrum/left buttocks with NS and pat dry.  Apply Santyl to open wounds.  Cover wth NS moist gauze.  Secure with foam dressing.   Will order low air loss bed Will offload heels with PRevalon boots   11/02/20 1411          Time coordinating discharge: 35 minutes  The results of significant diagnostics from this hospitalization (including imaging, microbiology, ancillary and laboratory) are listed below for reference.    Procedures and Diagnostic Studies:   CT ABDOMEN PELVIS W CONTRAST  Result Date: 10/19/2020 CLINICAL DATA:  Abdominal abscess/infection. Drainage from back wounds. EXAM: CT ABDOMEN AND PELVIS WITH CONTRAST TECHNIQUE: Multidetector CT imaging of the abdomen and pelvis was performed using the standard protocol following bolus administration of intravenous contrast. CONTRAST:  100mL OMNIPAQUE IOHEXOL 300 MG/ML  SOLN COMPARISON:  07/21/2020 FINDINGS: Lower chest: Mild basilar scarring.  No active lower chest disease. Hepatobiliary: No liver parenchymal lesion. Chronic intra and extrahepatic biliary ductal dilatation which may be becoming more prominent over time. The common duct appears to be dilated all the way to the ampulla. No calcified gallstones are seen. The gallbladder is distended. Pancreas: No pancreatic parenchymal lesion is seen. Pancreatic duct is mildly dilated. Spleen: Negative Adrenals/Urinary Tract: No adrenal or renal lesion. Bladder is unremarkable. Stomach/Bowel: Moderate amount of fecal matter in the colon. No other bowel finding. Vascular/Lymphatic: Aortic atherosclerosis. No aneurysm. IVC is normal. No adenopathy. Reproductive:  Uterine leiomyomas.  No significant pelvic mass. Other: No free fluid or air. 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 No evidence of significant soft tissue abscess collection. See results of lumbar CT. IMPRESSION: 1. 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 No evidence of significant  soft tissue abscess collection. See results of lumbar CT. 2. Chronic intra and extrahepatic biliary ductal dilatation which may be becoming more prominent over time. The common duct appears to be dilated all the way to the ampulla. No calcified gallstones are seen. The gallbladder is distended. Consider ultrasound and/or ERCP if there is clinical evidence of biliary obstruction. 3. Aortic atherosclerosis. 4. Uterine leiomyomas. 5. Moderate amount of fecal matter in the colon. Aortic Atherosclerosis (ICD10-I70.0).  A Electronically Signed   By: Paulina Fusi M.D.   On: 10/19/2020 18:24   CT L-SPINE NO CHARGE  Result Date: 10/19/2020 CLINICAL DATA:  Decubitus ulcer.  In EXAM: CT LUMBAR SPINE WITHOUT CONTRAST TECHNIQUE: Multidetector CT imaging of the lumbar spine was performed without intravenous contrast administration. Multiplanar CT image reconstructions were also generated. COMPARISON:  CT abdomen same day.  Previous lumbar radiographs. FINDINGS: Segmentation: 5 lumbar type vertebral bodies. Alignment: Thoracolumbar curvature convex to the right and lower lumbar curvature convex to the left. Vertebrae: There is been previous fusion from L1 to the sacrum, with hardware removal. Screw shadows remain visible in the pedicles. There appears to be solid union throughout that segment. There are hypertrophic sclerotic changes of the posterior elements at T11, T12 and L1 which could relate to chronic degenerative change, but the possibility of chronic osteomyelitis in that  region is not excluded. There is no gross destructive bone change. The posterior elements at T11, T12 and L1 are immediately at the area of the decubitus ulcer. Increased kyphotic curvature in that region probably places the overlying soft tissue at risk. There are old minor compression deformities at T11 and T12 Paraspinal and other soft tissues: No evidence of para spinous soft tissue abscess. Disc levels: As noted above, distant fusion through the lumbosacral region with sufficient patency of the canal. IMPRESSION: 1. Previous fusion from L1 to the sacrum with hardware removal. There appears to be solid union throughout that segment. 2. Hypertrophic sclerotic changes of the posterior elements at T11, T12 and L1 which could relate to chronic degenerative change, but the possibility of chronic osteomyelitis in that region is not excluded. There is no gross destructive bone change. There is increased kyphotic curvature in that region which probably places the overlying soft tissue at risk. There is no evidence of para spinous soft tissue abscess. The posterior elements of T11, T12 and L1 are closely related to the area of decubitus ulcer. Electronically Signed   By: Paulina Fusi M.D.   On: 10/19/2020 18:31     Labs:   Basic Metabolic Panel: Recent Labs  Lab 10/31/20 0327 11/01/20 0500 11/02/20 0445 11/03/20 0603 11/04/20 0501  NA 138 140 139 138 138  K 3.0* 3.3* 4.2 4.8 4.8  CL 110 110 109 107 107  CO2 23 24 24 26 26   GLUCOSE 90 179* 142* 120* 131*  BUN 11 10 18  24* 26*  CREATININE 0.31* 0.32* 0.45 <0.30* <0.30*  CALCIUM 8.2* 8.0* 7.9* 8.1* 8.3*  MG 1.8 1.9 1.7 1.7 1.6*  PHOS 3.1 2.2* 1.3*  1.4* 2.8 3.0   GFR CrCl cannot be calculated (This lab value cannot be used to calculate CrCl because it is not a number: <0.30). Liver Function Tests: Recent Labs  Lab 10/31/20 0327 11/01/20 0500 11/02/20 0445 11/03/20 0603 11/04/20 0501  ALBUMIN 1.7* 1.5* 1.4* 1.7* 1.6*   No results for  input(s): LIPASE, AMYLASE in the last 168 hours. No results for input(s): AMMONIA in the last 168 hours. Coagulation profile Recent Labs  Lab 10/30/20 0100  INR 1.4*    CBC: Recent Labs  Lab 10/31/20 0327 11/01/20 0500 11/02/20 0445 11/03/20 0603 11/04/20 0501  WBC 9.5 9.5 9.4 10.8* 9.1  HGB 7.8* 7.6* 7.3* 7.8* 7.2*  HCT 25.5* 24.4* 23.8* 25.8* 23.9*  MCV 102.0* 102.1* 103.5* 105.7* 107.7*  PLT 172 154 161 162 163   Cardiac Enzymes: No results for input(s): CKTOTAL, CKMB, CKMBINDEX, TROPONINI in the last 168 hours. BNP: Invalid input(s): POCBNP CBG: Recent Labs  Lab 11/02/20 0609 11/02/20 1136 11/03/20 0008 11/03/20 1212 11/04/20 0540  GLUCAP 126* 111* 111* 100* 129*   D-Dimer No results for input(s): DDIMER in the last 72 hours. Hgb A1c No results for input(s): HGBA1C in the last 72 hours. Lipid Profile No results for input(s): CHOL, HDL, LDLCALC, TRIG, CHOLHDL, LDLDIRECT in the last 72 hours. Thyroid function studies No results for input(s): TSH, T4TOTAL, T3FREE, THYROIDAB in the last 72 hours.  Invalid input(s): FREET3 Anemia work up No results for input(s): VITAMINB12, FOLATE, FERRITIN, TIBC, IRON, RETICCTPCT in the last 72 hours. Microbiology Recent Results (from the past 240 hour(s))  Aerobic Culture w Gram Stain (superficial specimen)     Status: None (Preliminary result)   Collection Time: 10/31/20 11:00 PM   Specimen: Wound  Result Value Ref Range Status   Specimen Description   Final    WOUND Performed at [facility], [address], [place], [place] [address]    Special Requests   Final    NONE Performed at [facility], [address] [place], [place] [address]    Gram Stain   Final    ABUNDANT WBC PRESENT,BOTH PMN AND MONONUCLEAR RARE GRAM NEGATIVE RODS    Culture   Final    MODERATE PSEUDOMONAS AERUGINOSA MODERATE KLEBSIELLA PNEUMONIAE SUSCEPTIBILITIES TO FOLLOW Performed at [facility], [address], [place], [place] [address]    Report Status PENDING  Incomplete     Signed: Melina Schools Dahal  [facility] 11/04/2020, 11:48 AM

## 2020-11-04 NOTE — TOC Progression Note (Signed)
Transition of Care [facility]) - Progression Note    Patient Details  Name: [name] MRN: [id number] Date of Birth: 07-25-1941  Transition of Care [facility]) CM/SW Contact  Amada Jupiter, LCSW Phone Number: 11/04/2020, 11:05 AM  Clinical Narrative:    Have left VM for pt's son with SNF bed offers.  Await return call re: choice.   Expected Discharge Plan: Skilled Nursing Facility Barriers to Discharge: Continued Medical Work up  Expected Discharge Plan and Services Expected Discharge Plan: Skilled Nursing Facility   Discharge Planning Services: CM Consult   Living arrangements for the past 2 months: Single Family Home Expected Discharge Date: 11/02/20                                     Social Determinants of Health (SDOH) Interventions    Readmission Risk Interventions No flowsheet data found.

## 2020-11-04 NOTE — TOC Progression Note (Signed)
Transition of Care [facility]) - Progression Note    Patient Details  Name: [name] MRN: [id number] Date of Birth: Aug 19, 1940  Transition of Care [facility]) CM/SW Contact  Armanda Heritage, RN Phone Number: 11/04/2020, 11:10 AM  Clinical Narrative:    CM spoke with patient's son Sherilyn Dacosta and daughter in law.  Confirmed family does not wish for patient to go to SNF, they are concerned about staffing issues and the level of care she would receive.  Family will take patient home with [facility] services, report she was active with palliative care prior to admission and [facility].  CM spoke with [facility] rep Barbara Cower, notified of need to resume services.  Agency to provide HHPT/RN/aide/Social work.  MD notified to place orders prior to dc.   Expected Discharge Plan: Skilled Nursing Facility Barriers to Discharge: Continued Medical Work up  Expected Discharge Plan and Services Expected Discharge Plan: Skilled Nursing Facility   Discharge Planning Services: CM Consult   Living arrangements for the past 2 months: Single Family Home Expected Discharge Date: 11/02/20                         [facility] Arranged: PT,RN,Nurse's Aide,Social Work HH Agency: [facility] Date HH Agency Contacted: 11/04/20 Time HH Agency Contacted: [id number] Representative spoke with at [facility] Agency: Barbara Cower   Social Determinants of Health (SDOH) Interventions    Readmission Risk Interventions No flowsheet data found.

## 2020-11-05 LAB — CBC
HCT: 23.5 % — ABNORMAL LOW (ref 36.0–46.0)
Hemoglobin: 7.2 g/dL — ABNORMAL LOW (ref 12.0–15.0)
MCH: 32.4 pg (ref 26.0–34.0)
MCHC: 30.6 g/dL (ref 30.0–36.0)
MCV: 105.9 fL — ABNORMAL HIGH (ref 80.0–100.0)
Platelets: 173 10*3/uL (ref 150–400)
RBC: 2.22 MIL/uL — ABNORMAL LOW (ref 3.87–5.11)
RDW: 18.3 % — ABNORMAL HIGH (ref 11.5–15.5)
WBC: 7.7 10*3/uL (ref 4.0–10.5)
nRBC: 1.4 % — ABNORMAL HIGH (ref 0.0–0.2)

## 2020-11-05 LAB — GLUCOSE, CAPILLARY
Glucose-Capillary: 101 mg/dL — ABNORMAL HIGH (ref 70–99)
Glucose-Capillary: 89 mg/dL (ref 70–99)

## 2020-11-05 MED ORDER — CHLORHEXIDINE GLUCONATE 0.12 % MT SOLN
15.0000 mL | Freq: Two times a day (BID) | OROMUCOSAL | Status: DC
  Administered 2020-11-05 – 2020-11-07 (×5): 15 mL via OROMUCOSAL
  Filled 2020-11-05 (×5): qty 15

## 2020-11-05 MED ORDER — ORAL CARE MOUTH RINSE
15.0000 mL | Freq: Two times a day (BID) | OROMUCOSAL | Status: DC
  Administered 2020-11-05 (×2): 15 mL via OROMUCOSAL

## 2020-11-05 MED ORDER — COLLAGENASE 250 UNIT/GM EX OINT
TOPICAL_OINTMENT | Freq: Every day | CUTANEOUS | Status: DC
  Administered 2020-11-06: 1 via TOPICAL
  Filled 2020-11-05 (×2): qty 30

## 2020-11-05 MED ORDER — CIPROFLOXACIN HCL 500 MG PO TABS
500.0000 mg | ORAL_TABLET | Freq: Two times a day (BID) | ORAL | Status: DC
  Administered 2020-11-05 – 2020-11-07 (×4): 500 mg via ORAL
  Filled 2020-11-05 (×4): qty 1

## 2020-11-05 NOTE — Progress Notes (Signed)
Pt elevated temp may partly be the air mattress is very warm to touch and she also had 4 blankets on. Tylenol given. Will reassess.

## 2020-11-05 NOTE — Progress Notes (Signed)
PROGRESS NOTE  [name]  DOB: [birth year]  PCP: [name], MD DTH:[id number]  DOA: 10/19/2020  LOS: 17 days   Chief Complaint  Patient presents with  . Wound Check    Brief narrative: ALICYN SUON is a 80 y.o. female with PMH significant for dementia, recent vertebral osteomyelitis (completed antibiotics on 09/08/2020), multiple decubitus ulcer, DVT/PE on Eliquis, OSA, HTN and HLD. Patient was brought to the ED on 10/19/2020 with failure to thrive.  Noted to have hypernatremia, infected decubitus ulcer with failed outpatient treatment with p.o. antibiotics.  She was admitted and treated with IV antibiotics and hypotonic fluid.   Hospital course was complicated by ICU/stepdown stay for acute hypoxemic respiratory failure due to aspiration pneumonia in the setting of acute metabolic encephalopathy.  CODE STATUS was changed to DNR/DNI per discussion between previous attending and family.  Received 7 days of Vanco and cefepime.  Respiratory failure and encephalopathy resolved.  Antibiotics was de-escalated to p.o. amoxicillin for her chronic thoracolumbar osteomyelitis.  On dysphagia 3 diet per SLP recommendation but p.o. intake remained poor.  PEG tube placed on 3/21.  Thoracolumbar wound seems to be worse with purulent drainage.  See below for details  Subjective: Patient was seen and examined this morning.  Pleasant elderly African-American female.  Lying down in bed. Not in distress. Family not at bedside. Patient was prepped for discharge yesterday but apparently family asked for more time for arrangements at home.  Assessment/Plan: Infected multiple decubitus ulcer, POA -CT of the abdomen pelvis does not show any acute osteomyelitis at this time. 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  Repeat blood culture NGTD -Vanco and cefepime 3/10-3/16>> p.o. Amoxil 3/16>> for 4 weeks, and outpt f/up in clinic in 4 weeks per ID. -Follow wound culture from thoracolumbar wound -3/22, repeat CT scan of lumbar spine was obtained which did not show any convincing evidence of acute osteomyelitis.  Acute hypoxic respiratory failure 2/2 aspiration event, resolving -CTA chest on 3/12 with complete collapse of RLL with large debris's in LLL bronchus  intermedius -Continue aspiration precautions -Antibiotics as above.  Acute metabolic encephalopathy Chronic dementia without behavioral disturbance -Oriented to self, place and person with seem to be her baseline. -Discontinued Aricept in the setting of bradycardia -Continue Namenda -Reorientation and delirium precautions  Sinus bradycardia:  -HR improved after Aricept was held  Hypovolemic hypernatremia: -Likely due to poor p.o. intake.  Resolved. -Continue monitoring  Hypokalemia/hypophosphatemia/hypomagnesemia:  -Labs improved with replacement. Recent Labs  Lab 10/31/20 0327 11/01/20 0500 11/02/20 0445 11/03/20 0603 11/04/20 0501  K 3.0* 3.3* 4.2 4.8 4.8  MG 1.8 1.9 1.7 1.7 1.6*  PHOS 3.1 2.2* 1.3*  1.4* 2.8 3.0   Essential hypertension: Normotensive. 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 -SCD for VTE prophylaxis  Normocytic anemia -Gradual drop in H&H but seems to be stable now between 7 and 8. No report of melena or hematochezia. Anemia panel consistent with ACD.  Could be due to poor nutrition. Recent Labs    03/09/20 1011 05/05/20 1429 07/29/20 0500 07/29/20 0908 07/29/20 1520 10/29/20 1441 10/30/20 0100 11/01/20 0500 11/02/20 0445 11/03/20 0603 11/04/20 0501  11/05/20 0352  HGB  --    < > 7.7* 7.6*   < >  --    < > 7.6* 7.3* 7.8* 7.2* 7.2*  MCV  --    < > 94.7 95.6   < >  --    < > 102.1* 103.5* 105.7* 107.7* 105.9*  VITAMINB12 754  --  743  --   --  671  --   --   --   --   --   --   FOLATE 12.6  --   --   --   --  7.6  --   --   --   --   --   --   FERRITIN  --   --   --   --   --  153  --   --   --   --   --   --   TIBC  --   --  115*  --   --  92*  --   --   --   --   --   --   IRON  --   --  16*  --   --  52  --   --   --   --   --   --   RETICCTPCT  --   --   --  3.1  --  3.2*  --   --   --   --   --   --    < > = values in this interval not displayed.   Recurrent hypoglycemia: Likely due to poor p.o. intake.  Not on insulin.  Glucose level is stable at this time. Recent Labs  Lab 11/03/20 0008 11/03/20 1212 11/04/20 0540 11/04/20 2051 11/05/20 0736  GLUCAP 111* 100* 129* 113* 101*   Mobility: Encourage ambulation Code Status:   Code Status: DNR  Nutritional status: Body mass index is 24.72 kg/m. Nutrition Problem: Increased nutrient needs Etiology: acute illness,wound healing Signs/Symptoms: estimated needs Diet Order            Diet general           DIET DYS 3 Room service appropriate? Yes; Fluid consistency: Thin  Diet effective now                 DVT prophylaxis: Place and maintain sequential compression device Start: 10/30/20 0722 SCD's Start: 10/29/20 1605 apixaban (ELIQUIS) tablet 5 mg   Antimicrobials:  Amoxicillin Fluid: Normal saline Consultants: Palliative care Family Communication:  None at bedside  Status is: Inpatient  Remains inpatient appropriate because: Family asking to wait till Monday for discharge to home with DME's and services  Dispo: The patient is from: Home              Anticipated d/c is to: Home with home health on Monday              Patient currently is medically stable to d/c.   Difficult to place patient No       Infusions:  . sodium chloride 10 mL/hr at 10/25/20 0451   . feeding supplement (OSMOLITE 1.5 CAL) 50 mL/hr at 10/31/20 2316    Scheduled Meds: . amoxicillin  500 mg Oral Q8H  . apixaban  5 mg Oral BID  . busPIRone  15 mg Oral BID  . chlorhexidine  15 mL Mouth Rinse BID  . collagenase   Topical Daily  . feeding supplement  1 Container Oral  BID BM  . feeding supplement (PROSource TF)  45 mL Per Tube BID  . free water  100 mL Per Tube Q6H  . linaclotide  145 mcg Oral Daily  . mouth rinse  15 mL Mouth Rinse BID  . mouth rinse  15 mL Mouth Rinse q12n4p  . memantine  28 mg Oral QHS  . nutrition supplement (JUVEN)  1 packet Per Tube BID BM  . sodium chloride flush  10-40 mL Intracatheter Q12H    Antimicrobials: Anti-infectives (From admission, onward)   Start     Dose/Rate Route Frequency Ordered Stop   11/02/20 0000  amoxicillin (AMOXIL) 500 MG capsule        500 mg Oral Every 8 hours 11/02/20 1411 11/25/20 2359   10/30/20 1200  ceFAZolin (ANCEF) IVPB 2g/100 mL premix        2 g 200 mL/hr over 30 Minutes Intravenous On call to O.R. 10/29/20 1604 10/31/20 0824   10/26/20 0930  amoxicillin (AMOXIL) capsule 500 mg        500 mg Oral Every 8 hours 10/26/20 0833 11/23/20 0559   10/21/20 2200  vancomycin (VANCOREADY) IVPB 750 mg/150 mL  Status:  Discontinued        750 mg 150 mL/hr over 60 Minutes Intravenous Every 24 hours 10/21/20 0739 10/26/20 0833   10/20/20 2200  vancomycin (VANCOREADY) IVPB 1250 mg/250 mL  Status:  Discontinued        1,250 mg 166.7 mL/hr over 90 Minutes Intravenous Every 24 hours 10/19/20 2253 10/21/20 0739   10/19/20 2315  ceFEPIme (MAXIPIME) 2 g in sodium chloride 0.9 % 100 mL IVPB  Status:  Discontinued        2 g 200 mL/hr over 30 Minutes Intravenous Every 12 hours 10/19/20 2226 10/26/20 0833   10/19/20 2300  vancomycin (VANCOREADY) IVPB 1000 mg/200 mL        1,000 mg 200 mL/hr over 60 Minutes Intravenous  Once 10/19/20 2209 10/20/20 0135   10/19/20 2300  ceFEPIme (MAXIPIME) 2 g in sodium chloride 0.9 % 100 mL  IVPB  Status:  Discontinued        2 g 200 mL/hr over 30 Minutes Intravenous  Once 10/19/20 2209 10/19/20 2226   10/19/20 1615  ceFAZolin (ANCEF) IVPB 1 g/50 mL premix        1 g 100 mL/hr over 30 Minutes Intravenous  Once 10/19/20 1614 10/19/20 1931      PRN meds: sodium chloride, acetaminophen, nitroGLYCERIN, oxyCODONE, sodium chloride flush   Objective: Vitals:   11/05/20 0011 11/05/20 0445  BP: (!) 111/57 (!) 129/59  Pulse: 93 78  Resp:  15  Temp: 98.5 F (36.9 C) 99.3 F (37.4 C)  SpO2: 98% 93%    Intake/Output Summary (Last 24 hours) at 11/05/2020 1115 Last data filed at 11/05/2020 1000 Gross per 24 hour  Intake 852.5 ml  Output 1050 ml  Net -197.5 ml   Filed Weights   10/19/20 2107  Weight: 61.3 kg   Weight change:  Body mass index is 24.72 kg/m.   Physical Exam: General exam: Elderly African-American female.  Lying down in bed.  Not in pain. Skin: No rashes, lesions or ulcers. HEENT: Atraumatic, normocephalic, no obvious bleeding Lungs: Clear to auscultation bilaterally CVS: Regular rate and rhythm, no murmur GI/Abd soft, nontender, nondistended, bowel sound present CNS: Alert, awake, knows she is in the hospital. Psychiatry: Depressed look Extremities: No pedal edema, no calf tenderness  Data Review: I have personally reviewed the  laboratory data and studies available.  Recent Labs  Lab 11/01/20 0500 11/02/20 0445 11/03/20 0603 11/04/20 0501 11/05/20 0352  WBC 9.5 9.4 10.8* 9.1 7.7  HGB 7.6* 7.3* 7.8* 7.2* 7.2*  HCT 24.4* 23.8* 25.8* 23.9* 23.5*  MCV 102.1* 103.5* 105.7* 107.7* 105.9*  PLT 154 161 162 163 173   Recent Labs  Lab 10/31/20 0327 11/01/20 0500 11/02/20 0445 11/03/20 0603 11/04/20 0501  NA 138 140 139 138 138  K 3.0* 3.3* 4.2 4.8 4.8  CL 110 110 109 107 107  CO2 23 24 24 26 26   GLUCOSE 90 179* 142* 120* 131*  BUN 11 10 18  24* 26*  CREATININE 0.31* 0.32* 0.45 <0.30* <0.30*  CALCIUM 8.2* 8.0* 7.9* 8.1* 8.3*  MG 1.8 1.9  1.7 1.7 1.6*  PHOS 3.1 2.2* 1.3*  1.4* 2.8 3.0    F/u labs ordered Unresulted Labs (From admission, onward)          Start     Ordered   10/30/20 0722  Occult blood card to lab, stool  Once,   R        10/30/20 0721   10/29/20 0730  CBC  Daily,   R     Question:  Specimen collection method  Answer:  IV Team=IV Team collect   10/28/20 2327          Signed, 10/30/20, MD [facility] 11/05/2020

## 2020-11-05 NOTE — Progress Notes (Signed)
Pressure injury of labia noted and documented. purewic d/c and legs propped open more for air flow/healing/pressure reduction.

## 2020-11-06 LAB — CBC
HCT: 23.2 % — ABNORMAL LOW (ref 36.0–46.0)
Hemoglobin: 7 g/dL — ABNORMAL LOW (ref 12.0–15.0)
MCH: 32.1 pg (ref 26.0–34.0)
MCHC: 30.2 g/dL (ref 30.0–36.0)
MCV: 106.4 fL — ABNORMAL HIGH (ref 80.0–100.0)
Platelets: 172 10*3/uL (ref 150–400)
RBC: 2.18 MIL/uL — ABNORMAL LOW (ref 3.87–5.11)
RDW: 18.6 % — ABNORMAL HIGH (ref 11.5–15.5)
WBC: 7.4 10*3/uL (ref 4.0–10.5)
nRBC: 1.1 % — ABNORMAL HIGH (ref 0.0–0.2)

## 2020-11-06 LAB — GLUCOSE, CAPILLARY
Glucose-Capillary: 115 mg/dL — ABNORMAL HIGH (ref 70–99)
Glucose-Capillary: 121 mg/dL — ABNORMAL HIGH (ref 70–99)

## 2020-11-06 LAB — PREPARE RBC (CROSSMATCH)

## 2020-11-06 MED ORDER — CIPROFLOXACIN HCL 500 MG PO TABS: 500.0000 mg | ORAL_TABLET | Freq: Two times a day (BID) | ORAL | 0 refills | Status: DC

## 2020-11-06 MED ORDER — COLLAGENASE 250 UNIT/GM EX OINT: TOPICAL_OINTMENT | Freq: Every day | CUTANEOUS | 0 refills | Status: AC

## 2020-11-06 MED ORDER — SODIUM CHLORIDE 0.9% IV SOLUTION: Freq: Once | INTRAVENOUS | Status: AC

## 2020-11-06 NOTE — Progress Notes (Signed)
Civil engineer, contracting [facility])  [name] is our current palliative patient in the community.  Notified by [facility] manager that she is d/c today back home.  We will follow up with her in the home.  Wallis Bamberg RN, BSN, CCRN [facility] Liaison

## 2020-11-06 NOTE — Discharge Summary (Signed)
Physician Discharge Summary  [name] Nish ZOX:[id number] DOB: 09/24/1940 DOA: 10/19/2020  PCP: Fleet ContrasAvbuere, Edwin, MD  Admit date: 10/19/2020 Discharge date: 11/06/2020  Admitted From: Home Discharge disposition: home with home health   Code Status: DNR  Diet Recommendation: Dysphagia 3 diet as tolerated Discharge Diagnosis:   Principal Problem:   Pressure injury of skin with infection Active Problems:   HTN (hypertension)   Takotsubo cardiomyopathy   OSA (obstructive sleep apnea)   Cellulitis   Hypernatremia  Chief Complaint  Patient presents with  . Wound Check    Brief narrative: [name] Magel is a 80 y.o. female with PMH significant for dementia, recent vertebral osteomyelitis (completed antibiotics on 09/08/2020), multiple decubitus ulcer, DVT/PE on Eliquis, OSA, HTN and HLD. Patient was brought to the ED on 10/19/2020 with failure to thrive.  Noted to have hypernatremia, infected decubitus ulcer with failed outpatient treatment with p.o. antibiotics.  She was admitted and treated with IV antibiotics and hypotonic fluid.   Hospital course was complicated by ICU/stepdown stay for acute hypoxemic respiratory failure due to aspiration pneumonia in the setting of acute metabolic encephalopathy.  Multiple discussions with patient family.  CODE STATUS changed to DNR/DNI.  Respiratory failure and encephalopathy resolved. Patient received 7 days of Vanco and cefepime.  Antibiotics was de-escalated to p.o. amoxicillin for her chronic thoracolumbar osteomyelitis.  Oral ciprofloxacin was also added later. On dysphagia 3 diet per SLP recommendation but p.o. intake remained poor.  PEG tube placed on 3/21.  Thoracolumbar wound continues to require aggressive wound care  Subjective: Patient was seen and examined this morning. Pleasant elderly African-American female. Lying down in bed. Not in distress. Hemoglobin low at 7 today.  After discussion with family, I ordered for unit of  PRBC.  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  -CT of the abdomen pelvis did not show any acute osteomyelitis. -CT L-spine showed chronic degenerative changes at T11-L1 but could not exclude chronic osteomyelitis -Patient was treated with IV vancomycin and and IV cefepime 3/10-3/16 which was later switched to p.o. Amoxil 3/16 for 4 weeks.   -3/22, repeat CT scan of lumbar spine was obtained which did not show any convincing evidence of acute osteomyelitis. -Wound culture from 3/22 grew Pseudomonas aeruginosa and Klebsiella pneumonia.  Oral ciprofloxacin twice a day was added as well. -Patient will follow up with ID as an outpatient in 4 weeks.  Patient will also follow-up with wound clinic as an outpatient.  Acute hypoxic respiratory failure 2/2 aspiration event -CTA chest on 3/12 with complete collapse of RLL with large debris's in LLL bronchus  intermedius -Continue aspiration precautions -Antibiotics as above.   Dysphagia -PEG tube placed on 3/29.  Currently on tube feeding.  Continue the same at home  Acute metabolic encephalopathy Chronic dementia without behavioral disturbance -Oriented to self, place and person with seem to be her baseline. -Discontinued Aricept in the setting of bradycardia -Continue Namenda. -Reorientation and delirium precautions.  Sinus bradycardia:  -HR improved after Aricept was held.   Hypovolemia/hypernatremia: -Likely due to poor p.o. intake. Resolved. -Continue monitoring Recent Labs  Lab 10/31/20 0327 11/01/20 0500 11/02/20 0445 11/03/20 0603 11/04/20 0501  NA 138 140 139 138 138   Hypokalemia/hypophosphatemia/hypomagnesemia:  -Electrolyte levels were low because of poor oral intake.  Replaced as needed.. 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  Hemoglobin is chronically low.  No active bleeding though.  Acute on chronic normocytic anemia -Gradual drop in H&H but seems to be stable now between 7 and 8. No report of melena or hematochezia. -Anemia panel consistent with ACD.  Could be due to poor nutrition. -Hemoglobin low at 7 today.  To promote wound healing, I would give her a unit of PRBC transfusion before discharge today. Recent Labs    03/09/20 1011 05/05/20 1429 07/29/20 0500 07/29/20 0908 07/29/20 1520 10/29/20 1441 10/30/20 0100 11/02/20 0445 11/03/20 0603 11/04/20 0501 11/05/20 0352 11/06/20 0354  HGB  --    < > 7.7* 7.6*   < >  --    < > 7.3* 7.8* 7.2* 7.2* 7.0*  MCV  --    < > 94.7 95.6   < >  --    < > 103.5* 105.7* 107.7* 105.9* 106.4*  VITAMINB12 754  --  743  --   --  671  --   --   --   --   --   --   FOLATE 12.6  --   --   --   --  7.6  --   --   --   --   --   --   FERRITIN  --   --   --   --   --  153  --   --   --   --   --   --   TIBC  --   --  115*  --   --  92*  --   --   --   --   --   --   IRON  --   --  16*  --   --  52  --   --   --   --   --   --   RETICCTPCT  --   --   --  3.1  --  3.2*  --   --   --   --   --   --    < > = values in this interval not displayed.   Recurrent hypoglycemia:  -Improved after tube feeding was started. Recent Labs  Lab 11/04/20 0540 11/04/20 2051 11/05/20 0736 11/05/20 1711 11/06/20 0604  GLUCAP 129* 113* 101* 89 115*   Wound care: Wound / Incision (Open or Dehisced) 05/27/20 (MASD) Moisture Associated Skin Damage Perineum Right;Left (Active)  Date First Assessed/Time First Assessed: 05/27/20 0610   Wound Type: (MASD) Moisture Associated Skin Damage   Location: Perineum  Location Orientation: Right;Left    Assessments 05/27/2020  6:10 AM 05/29/2020 10:31 PM  Dressing Type - None  Site / Wound Assessment - Clean;Dry  Treatment Other (Comment) Cleansed;Other (Comment)     No Linked orders to display     Pressure Injury 10/19/20 Vertebral column Left;Mid Unstageable - Full thickness tissue loss in which the base of the injury is covered by slough (yellow, tan, gray, green or brown) and/or eschar (tan, brown or black) in the wound bed. eschar [name]/t removal  (Active)  Date First Assessed/Time First Assessed: 10/19/20 2020   Location: Vertebral column  Location Orientation: Left;Mid  Staging: Unstageable - Full thickness tissue loss in which the base of the injury is covered by slough (yellow, tan, gray, green or br...    Assessments 10/24/2020 11:45 AM 11/05/2020  1:00 PM  Dressing Type Santyl;Foam - Lift dressing to assess site every shift -  Dressing Changed;Clean;Intact;New drainage Changed  Dressing Change Frequency Daily -  State of Healing Eschar -  Peri-wound Assessment Erythema (non-blanchable);Pink -  Wound Length (cm) 6.5 cm -  Wound Width (cm) 5 cm -  Wound Depth (cm) 0 cm -  Wound Surface Area (cm^2) 32.5 cm^2 -  Wound Volume (cm^3) 0 cm^3 -  Drainage Amount Minimal -  Drainage Description Purulent -  Treatment Cleansed -     No Linked orders to display     Pressure Injury 10/19/20 Buttocks Left Stage 4 - Full thickness tissue loss with exposed bone, tendon or muscle. 7x2x3, tunneling at 12'oclock-6cm, 9'oclock-1.5cm. (Active)  Date First Assessed/Time First Assessed: 10/19/20 2020   Location: Buttocks  Location Orientation: Left  Staging: Stage 4 - Full thickness tissue loss with exposed bone, tendon or muscle.  Wound Description (Comments): 7x2x3, tunneling at 12'oclock-6c...    Assessments 10/24/2020 11:45 AM 11/05/2020  1:00 PM  Dressing Type Foam - Lift dressing to assess site every shift;Moist to dry -  Dressing Changed  Changed  Dressing Change Frequency Daily -  Margins Attached edges (approximated) -  Drainage Amount Scant -  Drainage Description Purulent;Serosanguineous -  Treatment Cleansed;Packing (Saline gauze) -     No Linked orders to display     Pressure Injury 10/19/20 Buttocks Mid Stage 3 -  Full thickness tissue loss. Subcutaneous fat may be visible but bone, tendon or muscle are NOT exposed. intragluteal cluster, 8.5x5 (Active)  Date First Assessed/Time First Assessed: 10/19/20 2020   Location: Buttocks  Location Orientation: Mid  Staging: Stage 3 -  Full thickness tissue loss. Subcutaneous fat may be visible but bone, tendon or muscle are NOT exposed.  Wound Description (Com...    Assessments 10/24/2020 11:45 AM 11/05/2020  1:00 PM  Dressing Type Foam - Lift dressing to assess site every shift -  Dressing Changed;Clean;Intact Changed  Dressing Change Frequency Daily -  State of Healing Epithelialized -  Site / Wound Assessment Clean;Dry;Pink;Red;Yellow -  Peri-wound Assessment Intact;Erythema (non-blanchable) -  Margins Attached edges (approximated) -  Drainage Amount None -  Treatment Cleansed -     No Linked orders to display     Pressure Injury 11/05/20 Labia Right;Left Stage 3 -  Full thickness tissue loss. Subcutaneous fat may be visible but bone, tendon or muscle are NOT exposed. open skin on labia (Active)  Date First Assessed/Time First Assessed: 11/05/20 2230   Location: Labia  Location Orientation: Right;Left  Staging: Stage 3 -  Full thickness tissue loss. Subcutaneous fat may be visible but bone, tendon or muscle are NOT exposed.  Wound Description ...    No assessment data to display     No Linked orders to display    Discharge Exam:   Vitals:   11/05/20 1353 11/05/20 2100 11/05/20 2145 11/06/20 0607  BP: 137/67 132/65  114/60  Pulse: 87 85  86  Resp: 16 16  20   Temp: 98.7 F (37.1 C) (!) 100.7 F (38.2 C) 100.3 F (37.9 C) 98.1 F (36.7 C)  TempSrc: Oral Oral  Oral Oral  SpO2: 100% 100%  98%  Weight:      Height:        Body mass index  is 24.72 kg/m.  General exam: Elderly African-American female.  Lying down in bed.  Not in distress.  Slow to respond Skin: No rashes, lesions or ulcers. HEENT: Atraumatic, normocephalic, no obvious bleeding Lungs: Clear to auscultation bilaterally CVS: Regular rate and rhythm, no murmur GI/Abd soft, nontender, nondistended, bowel sound present, PEG tube site intact CNS: Alert, awake, oriented to place Psychiatry: Depressed look Extremities: No pedal edema, no calf tenderness  Follow ups:   Discharge Instructions    Diet general   Complete by: As directed    Tube feeding   Discharge wound care:   Complete by: As directed    Cleanse wounds to spine and sacrum/left buttocks with NS and pat dry.  Apply Santyl to open wounds.  Cover wth NS moist gauze.  Secure with foam dressing.   Will order low air loss bed Will offload heels with PRevalon boots   Discharge wound care:   Complete by: As directed    Cleanse wounds to spine and sacrum/left buttocks with NS and pat dry.  Apply Santyl to open wounds.  Cover wth NS moist gauze.  Secure with foam dressing.   Will order low air loss bed Will offload heels with PRevalon boots   Increase activity slowly   Complete by: As directed    Increase activity slowly   Complete by: As directed       Follow-up Information    [name], MD Follow up.   Specialty: Internal Medicine Contact information: [address] [place] [address] [phone]        Jodelle Red, MD .   Specialty: Cardiology Contact information: [address] [place] [address] [phone]        [facility] Follow up.   Why: agency will provide home health nurse, physical therapy, aide, social work Contact information: [address] [place] [address] [phone]               Recommendations  for Outpatient Follow-Up:   1. Follow-up with PCP as an outpatient  Discharge Instructions:  Follow with Primary MD [name], MD in 7 days   Get CBC/BMP checked in next visit within 1 week by PCP or SNF MD ( we routinely change or add medications that can affect your baseline labs and fluid status, therefore we recommend that you get the mentioned basic workup next visit with your PCP, your PCP may decide not to get them or add new tests based on their clinical decision)  On your next visit with your PCP, please Get Medicines reviewed and adjusted.  Please request your PCP  to go over all Hospital Tests and Procedure/Radiological results at the follow up, please get all Hospital records sent to your Prim MD by signing hospital release before you go home.  Activity: As tolerated with Full fall precautions use walker/cane & assistance as needed  For Heart failure patients - Check your Weight same time everyday, if you gain over 2 pounds, or you develop in leg swelling, experience more shortness of breath or chest pain, call your Primary MD immediately. Follow Cardiac Low Salt Diet and 1.5 lit/day fluid restriction.  If you have smoked or chewed Tobacco in the last 2 yrs please stop smoking, stop any regular Alcohol  and or any Recreational drug use.  If you experience worsening of your admission symptoms, develop shortness of breath, life threatening emergency, suicidal or homicidal thoughts you must seek medical attention immediately by  calling 911 or calling your MD immediately  if symptoms less severe.  You Must read complete instructions/literature along with all the possible adverse reactions/side effects for all the Medicines you take and that have been prescribed to you. Take any new Medicines after you have completely understood and accpet all the possible adverse reactions/side effects.   Do not drive, operate heavy machinery, perform activities at heights, swimming or  participation in water activities or provide baby sitting services if your were admitted for syncope or siezures until you have seen by Primary MD or a Neurologist and advised to do so again.  Do not drive when taking Pain medications.  Do not take more than prescribed Pain, Sleep and Anxiety Medications  Wear Seat belts while driving.   Please note You were cared for by a hospitalist during your hospital stay. If you have any questions about your discharge medications or the care you received while you were in the hospital after you are discharged, you can call the unit and asked to speak with the hospitalist on call if the hospitalist that took care of you is not available. Once you are discharged, your primary care physician will handle any further medical issues. Please note that NO REFILLS for any discharge medications will be authorized once you are discharged, as it is imperative that you return to your primary care physician (or establish a relationship with a primary care physician if you do not have one) for your aftercare needs so that they can reassess your need for medications and monitor your lab values.    Allergies as of 11/06/2020      Reactions   T-pa [alteplase] Swelling   Does okay with it if premedicated with benadryl.   Codeine Itching      Medication List    STOP taking these medications   amLODipine 10 MG tablet Commonly known as: NORVASC   donepezil 10 MG tablet Commonly known as: ARICEPT   furosemide 40 MG tablet Commonly known as: LASIX   lisinopril 5 MG tablet Commonly known as: ZESTRIL     TAKE these medications   acetaminophen 325 MG tablet Commonly known as: TYLENOL Take 2 tablets (650 mg total) by mouth every 4 (four) hours as needed for mild pain or moderate pain (or Fever >/= 101).   amoxicillin 500 MG capsule Commonly known as: AMOXIL Take 1 capsule (500 mg total) by mouth every 8 (eight) hours for 23 days.   atorvastatin 20 MG  tablet Commonly known as: LIPITOR Take 20 mg by mouth every evening.   busPIRone 30 MG tablet Commonly known as: BUSPAR Take 15 mg by mouth 2 (two) times daily.   cholecalciferol 25 MCG (1000 UNIT) tablet Commonly known as: VITAMIN D3 Take 1,000 Units by mouth daily.   ciprofloxacin 500 MG tablet Commonly known as: CIPRO Take 1 tablet (500 mg total) by mouth 2 (two) times daily for 14 days.   collagenase ointment Commonly known as: SANTYL Apply topically daily. Start taking on: November 07, 2020   dronabinol 2.5 MG capsule Commonly known as: MARINOL Take 2.5 mg by mouth at bedtime.   Eliquis 5 MG Tabs tablet Generic drug: apixaban Take 5 mg by mouth 2 (two) times daily.   feeding supplement Liqd Take 237 mLs by mouth 3 (three) times daily between meals. What changed: Another medication with the same name was added. Make sure you understand how and when to take each.   nutrition supplement (JUVEN) Pack Place 1 packet  into feeding tube 2 (two) times daily between meals. What changed: You were already taking a medication with the same name, and this prescription was added. Make sure you understand how and when to take each.   feeding supplement (PROSource TF) liquid Place 45 mLs into feeding tube 2 (two) times daily. What changed: You were already taking a medication with the same name, and this prescription was added. Make sure you understand how and when to take each.   feeding supplement (OSMOLITE 1.5 CAL) Liqd Place 1,000 mLs into feeding tube continuous. What changed: You were already taking a medication with the same name, and this prescription was added. Make sure you understand how and when to take each.   ferrous sulfate 325 (65 FE) MG tablet Take 1 tablet (325 mg total) by mouth daily with breakfast.   free water Soln Place 100 mLs into feeding tube every 6 (six) hours.   Linzess 145 MCG Caps capsule Generic drug: linaclotide Take 145 mcg by mouth daily.    memantine 28 MG Cp24 24 hr capsule Commonly known as: NAMENDA XR Take 1 capsule (28 mg total) by mouth daily. What changed: when to take this   MULTI-VITAMIN GUMMIES PO Take 1 each by mouth daily.   nitroGLYCERIN 0.4 MG SL tablet Commonly known as: NITROSTAT Place 1 tablet (0.4 mg total) under the tongue every 5 (five) minutes as needed for chest pain.   oxyCODONE 15 MG immediate release tablet Commonly known as: ROXICODONE Take 7.5-15 mg by mouth 4 (four) times daily as needed for pain.   polyethylene glycol 17 g packet Commonly known as: MIRALAX / GLYCOLAX Take 17 g by mouth 2 (two) times daily.   potassium chloride 10 MEQ tablet Commonly known as: KLOR-CON Take 10 mEq by mouth daily.   prochlorperazine 10 MG tablet Commonly known as: COMPAZINE Take 10 mg by mouth every 6 (six) hours as needed for nausea or vomiting.   Restasis 0.05 % ophthalmic emulsion Generic drug: cycloSPORINE Place 1 drop into both eyes daily as needed (dry eyes).   vitamin C 1000 MG tablet Take 1,000 mg by mouth daily.            Discharge Care Instructions  (From admission, onward)         Start     Ordered   11/04/20 0000  Discharge wound care:       Comments: Cleanse wounds to spine and sacrum/left buttocks with NS and pat dry.  Apply Santyl to open wounds.  Cover wth NS moist gauze.  Secure with foam dressing.   Will order low air loss bed Will offload heels with PRevalon boots   11/04/20 1147   11/02/20 0000  Discharge wound care:       Comments: Cleanse wounds to spine and sacrum/left buttocks with NS and pat dry.  Apply Santyl to open wounds.  Cover wth NS moist gauze.  Secure with foam dressing.   Will order low air loss bed Will offload heels with PRevalon boots   11/02/20 1411          Time coordinating discharge: 35 minutes  The results of significant diagnostics from this hospitalization (including imaging, microbiology, ancillary and laboratory) are listed below  for reference.    Procedures and Diagnostic Studies:   CT ABDOMEN PELVIS W CONTRAST  Result Date: 10/19/2020 CLINICAL DATA:  Abdominal abscess/infection. Drainage from back wounds. EXAM: CT ABDOMEN AND PELVIS WITH CONTRAST TECHNIQUE: Multidetector CT imaging of the abdomen and pelvis  was performed using the standard protocol following bolus administration of intravenous contrast. CONTRAST:  OMNIPAQUE IOHEXOL 300 MG/ML  SOLN COMPARISON:  07/21/2020 FINDINGS: Lower chest: Mild basilar scarring.  No active lower chest disease. Hepatobiliary: No liver parenchymal lesion. Chronic intra and extrahepatic biliary ductal dilatation which may be becoming more prominent over time. The common duct appears to be dilated all the way to the ampulla. No calcified gallstones are seen. The gallbladder is distended. Pancreas: No pancreatic parenchymal lesion is seen. Pancreatic duct is mildly dilated. Spleen: Negative Adrenals/Urinary Tract: No adrenal or renal lesion. Bladder is unremarkable. Stomach/Bowel: Moderate amount of fecal matter in the colon. No other bowel finding. Vascular/Lymphatic: Aortic atherosclerosis. No aneurysm. IVC is normal. No adenopathy. Reproductive: Uterine leiomyomas.  No significant pelvic mass. Other: No free fluid or air. 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 No evidence of significant soft tissue abscess collection. See results of lumbar CT. IMPRESSION: 1. 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 No evidence of significant soft tissue abscess collection. See results of lumbar CT. 2. Chronic intra and extrahepatic biliary ductal dilatation which may be becoming more prominent over time. The common duct appears to be dilated all the way to the ampulla. No calcified gallstones are seen. The gallbladder is distended. Consider  ultrasound and/or ERCP if there is clinical evidence of biliary obstruction. 3. Aortic atherosclerosis. 4. Uterine leiomyomas. 5. Moderate amount of fecal matter in the colon. Aortic Atherosclerosis (ICD10-I70.0).  A Electronically Signed   By: Paulina Fusi M.[name].   On: 10/19/2020 18:24   CT L-SPINE NO CHARGE  Result Date: 10/19/2020 CLINICAL DATA:  Decubitus ulcer.  In EXAM: CT LUMBAR SPINE WITHOUT CONTRAST TECHNIQUE: Multidetector CT imaging of the lumbar spine was performed without intravenous contrast administration. Multiplanar CT image reconstructions were also generated. COMPARISON:  CT abdomen same day.  Previous lumbar radiographs. FINDINGS: Segmentation: 5 lumbar type vertebral bodies. Alignment: Thoracolumbar curvature convex to the right and lower lumbar curvature convex to the left. Vertebrae: There is been previous fusion from L1 to the sacrum, with hardware removal. Screw shadows remain visible in the pedicles. There appears to be solid union throughout that segment. There are hypertrophic sclerotic changes of the posterior elements at T11, T12 and L1 which could relate to chronic degenerative change, but the possibility of chronic osteomyelitis in that region is not excluded. There is no gross destructive bone change. The posterior elements at T11, T12 and L1 are immediately at the area of the decubitus ulcer. Increased kyphotic curvature in that region probably places the overlying soft tissue at risk. There are old minor compression deformities at T11 and T12 Paraspinal and other soft tissues: No evidence of para spinous soft tissue abscess. Disc levels: As noted above, distant fusion through the lumbosacral region with sufficient patency of the canal. IMPRESSION: 1. Previous fusion from L1 to the sacrum with hardware removal. There appears to be solid union throughout that segment. 2. Hypertrophic sclerotic changes of the posterior elements at T11, T12 and L1 which could relate to chronic  degenerative change, but the possibility of chronic osteomyelitis in that region is not excluded. There is no gross destructive bone change. There is increased kyphotic curvature in that region which probably places the overlying soft tissue at risk. There is no evidence of para spinous soft tissue abscess. The posterior elements of T11, T12  and L1 are closely related to the area of decubitus ulcer. Electronically Signed   By: Paulina Fusi M.[name].   On: 10/19/2020 18:31     Labs:   Basic Metabolic Panel: Recent Labs  Lab 10/31/20 0327 11/01/20 0500 11/02/20 0445 11/03/20 0603 11/04/20 0501  NA 138 140 139 138 138  K 3.0* 3.3* 4.2 4.8 4.8  CL 110 110 109 107 107  CO2 23 24 24 26 26   GLUCOSE 90 179* 142* 120* 131*  BUN 11 10 18  24* 26*  CREATININE 0.31* 0.32* 0.45 <0.30* <0.30*  CALCIUM 8.2* 8.0* 7.9* 8.1* 8.3*  MG 1.8 1.9 1.7 1.7 1.6*  PHOS 3.1 2.2* 1.3*  1.4* 2.8 3.0   GFR CrCl cannot be calculated (This lab value cannot be used to calculate CrCl because it is not a number: <0.30). Liver Function Tests: Recent Labs  Lab 10/31/20 0327 11/01/20 0500 11/02/20 0445 11/03/20 0603 11/04/20 0501  ALBUMIN 1.7* 1.5* 1.4* 1.7* 1.6*   No results for input(s): LIPASE, AMYLASE in the last 168 hours. No results for input(s): AMMONIA in the last 168 hours. Coagulation profile No results for input(s): INR, PROTIME in the last 168 hours.  CBC: Recent Labs  Lab 11/02/20 0445 11/03/20 0603 11/04/20 0501 11/05/20 0352 11/06/20 0354  WBC 9.4 10.8* 9.1 7.7 7.4  HGB 7.3* 7.8* 7.2* 7.2* 7.0*  HCT 23.8* 25.8* 23.9* 23.5* 23.2*  MCV 103.5* 105.7* 107.7* 105.9* 106.4*  PLT 161 162 163 173 172   Cardiac Enzymes: No results for input(s): CKTOTAL, CKMB, CKMBINDEX, TROPONINI in the last 168 hours. BNP: Invalid input(s): POCBNP CBG: Recent Labs  Lab 11/04/20 0540 11/04/20 2051 11/05/20 0736 11/05/20 1711 11/06/20 0604  GLUCAP 129* 113* 101* 89 115*   [name]-Dimer No results for  input(s): DDIMER in the last 72 hours. Hgb A1c No results for input(s): HGBA1C in the last 72 hours. Lipid Profile No results for input(s): CHOL, HDL, LDLCALC, TRIG, CHOLHDL, LDLDIRECT in the last 72 hours. Thyroid function studies No results for input(s): TSH, T4TOTAL, T3FREE, THYROIDAB in the last 72 hours.  Invalid input(s): FREET3 Anemia work up No results for input(s): VITAMINB12, FOLATE, FERRITIN, TIBC, IRON, RETICCTPCT in the last 72 hours. Microbiology Recent Results (from the past 240 hour(s))  Aerobic Culture w Gram Stain (superficial specimen)     Status: None   Collection Time: 10/31/20 11:00 PM   Specimen: Wound  Result Value Ref Range Status   Specimen Description   Final    WOUND Performed at [facility], [address] [place], [place]    Special Requests   Final    NONE Performed at [facility], [address] [place], [place]    Gram Stain   Final    ABUNDANT WBC PRESENT,BOTH PMN AND MONONUCLEAR RARE GRAM NEGATIVE RODS Performed at [facility], [address], [place], [address] [place]    Culture   Final    MODERATE PSEUDOMONAS AERUGINOSA MODERATE KLEBSIELLA PNEUMONIAE    Report Status 11/04/2020 FINAL  Final   Organism ID, Bacteria PSEUDOMONAS AERUGINOSA  Final   Organism ID, Bacteria KLEBSIELLA PNEUMONIAE  Final      Susceptibility   Klebsiella pneumoniae - MIC*    AMPICILLIN >=32 RESISTANT Resistant     CEFAZOLIN <=4 SENSITIVE Sensitive     CEFEPIME <=0.12 SENSITIVE Sensitive     CEFTAZIDIME <=1 SENSITIVE Sensitive     CEFTRIAXONE <=0.25 SENSITIVE Sensitive     CIPROFLOXACIN <=  0.25 SENSITIVE Sensitive     GENTAMICIN <=1 SENSITIVE Sensitive     IMIPENEM <=0.25 SENSITIVE Sensitive     TRIMETH/SULFA <=20 SENSITIVE Sensitive     AMPICILLIN/SULBACTAM 8 SENSITIVE Sensitive     PIP/TAZO <=4 SENSITIVE Sensitive     * MODERATE KLEBSIELLA PNEUMONIAE   Pseudomonas aeruginosa - MIC*     CEFTAZIDIME 4 SENSITIVE Sensitive     CIPROFLOXACIN <=0.25 SENSITIVE Sensitive     GENTAMICIN <=1 SENSITIVE Sensitive     IMIPENEM 2 SENSITIVE Sensitive     PIP/TAZO 8 SENSITIVE Sensitive     CEFEPIME 4 SENSITIVE Sensitive     * MODERATE PSEUDOMONAS AERUGINOSA     Signed: Binaya Dahal  [facility] 11/06/2020, 10:33 AM

## 2020-11-06 NOTE — Progress Notes (Signed)
Wound care performed to back, buttocks and sacral area as ordered. Pt tolerated fair, calling out with pain during the procedure but settling down more comfortably after wound care completed. Pt provided with verbal reassurance during wound care. Will continue to monitor and assist with pain control.  Ardyth Gal, RN 11/06/2020

## 2020-11-06 NOTE — TOC Progression Note (Addendum)
Transition of Care [facility]) - Progression Note    Patient Details  Name: [name] MRN: [id number] Date of Birth: 1940-10-14  Transition of Care [facility]) CM/SW Contact  Darleene Cleaver, [place] Phone Number: 11/06/2020, 12:28 PM  Clinical Narrative:     CSW spoke to Jeri Modena from [facility], she can provide patient's feeding tube supplies.  She is working on making the arrangements to provide supplies.  CSW to facilitate discharge planning for today.  Patient has an upcoming appointment at the wound clinic on Friday November 10, 2020 at 9am.  CSW contacted the wound clinic to confirm appointment.  Per wound care, patient is still scheduled for wound care follow up appointment.  3:30pm  CSW was informed that [facility] is following patient for feeding tube supplies.  CSW to facilitate discharge planning to patient's home.     Expected Discharge Plan: Skilled Nursing Facility Barriers to Discharge: Continued Medical Work up  Expected Discharge Plan and Services Expected Discharge Plan: Skilled Nursing Facility   Discharge Planning Services: CM Consult   Living arrangements for the past 2 months: Single Family Home Expected Discharge Date: 11/04/20                         [facility] Arranged: PT,RN,Nurse's Aide,Social Work HH Agency: [facility] Date HH Agency Contacted: 11/04/20 Time HH Agency Contacted: [phone] Representative spoke with at [facility] Agency: Barbara Cower   Social Determinants of Health (SDOH) Interventions    Readmission Risk Interventions No flowsheet data found.

## 2020-11-07 LAB — TYPE AND SCREEN
ABO/RH(D): AB POS
Antibody Screen: NEGATIVE
Unit division: 0

## 2020-11-07 LAB — BPAM RBC
Blood Product Expiration Date: 202204192359
ISSUE DATE / TIME: 202203281149
Unit Type and Rh: 6200

## 2020-11-07 LAB — CBC
HCT: 26.9 % — ABNORMAL LOW (ref 36.0–46.0)
Hemoglobin: 8.1 g/dL — ABNORMAL LOW (ref 12.0–15.0)
MCH: 31.5 pg (ref 26.0–34.0)
MCHC: 30.1 g/dL (ref 30.0–36.0)
MCV: 104.7 fL — ABNORMAL HIGH (ref 80.0–100.0)
Platelets: 167 10*3/uL (ref 150–400)
RBC: 2.57 MIL/uL — ABNORMAL LOW (ref 3.87–5.11)
RDW: 21.2 % — ABNORMAL HIGH (ref 11.5–15.5)
WBC: 7.2 10*3/uL (ref 4.0–10.5)
nRBC: 0.8 % — ABNORMAL HIGH (ref 0.0–0.2)

## 2020-11-07 MED ORDER — CIPROFLOXACIN HCL 500 MG PO TABS: 500.0000 mg | ORAL_TABLET | Freq: Two times a day (BID) | ORAL | 0 refills | Status: AC

## 2020-11-07 NOTE — TOC Transition Note (Signed)
Transition of Care [facility]) - CM/SW Discharge Note   Patient Details  Name: [name] MRN: [id number] Date of Birth: Dec 22, 1940  Transition of Care [name]) CM/SW Contact:  Darleene Cleaver, LCSW Phone Number: 11/07/2020, 1:00 PM   Clinical Narrative:     Patient will be going home with feeding tube, and also wound care needs.  [facility] is following patient for feeding tube supplies.  Patient will be going home with home health through [facility] for PT, OT, Aide, Social Work, and Charity fundraiser.  CSW signing off please reconsult with any other social work needs, home health agency has been notified of planned discharge.  Patient is currently being followed by [facility] for outpatient palliative, CSW updated Chrysilyn that patient is discharging today.  CSW spoke to patient's son, and he is requesting EMS transport to get patient back home.  CSW contacted EMS and scheduled transport.  Final next level of care: Home w Home Health Services Barriers to Discharge: Barriers Resolved   Patient Goals and CMS Choice Patient states their goals for this hospitalization and ongoing recovery are:: To return back home with home health. CMS Medicare.gov Compare Post Acute Care list provided to:: Patient Represenative (must comment) Choice offered to / list presented to : Adult Children  Discharge Placement  Home with home health through [facility] and palliative outpatient by [facility].                     Discharge Plan and Services   Discharge Planning Services: CM Consult            DME Arranged: Tube feeding,Tube feeding pump DME Agency: [facility] Date DME Agency Contacted: 11/07/20 Time DME Agency Contacted: [phone] Representative spoke with at DME Agency: Ian Malkin HH Arranged: PT,OT,RN,Nurse's Aide,Social Work,Refused SNF HH Agency: [facility] Date HH Agency Contacted: 11/07/20 Time HH Agency Contacted: [phone] Representative spoke with at [facility] Agency:  Thea Silversmith  Social Determinants of Health (SDOH) Interventions     Readmission Risk Interventions No flowsheet data found.

## 2020-11-07 NOTE — Discharge Summary (Signed)
Physician Discharge Summary  [name] QTM:[id number] DOB: 1941-02-05 DOA: 10/19/2020  PCP: [name], MD  Admit date: 10/19/2020 Discharge date: 11/07/2020  Admitted From: Home Discharge disposition: home with home health   Code Status: DNR  Diet Recommendation: Dysphagia 3 diet as tolerated Discharge Diagnosis:   Principal Problem:   Pressure injury of skin with infection Active Problems:   HTN (hypertension)   Takotsubo cardiomyopathy   OSA (obstructive sleep apnea)   Cellulitis   Hypernatremia  Chief Complaint  Patient presents with  . Wound Check    Brief narrative: [name] is a 80 y.o. female with PMH significant for dementia, recent vertebral osteomyelitis (completed antibiotics on 09/08/2020), multiple decubitus ulcer, DVT/PE on Eliquis, OSA, HTN and HLD. Patient was brought to the ED on 10/19/2020 with failure to thrive.  Noted to have hypernatremia, infected decubitus ulcer with failed outpatient treatment with p.o. antibiotics.  She was admitted and treated with IV antibiotics and hypotonic fluid.   Hospital course was complicated by ICU/stepdown stay for acute hypoxemic respiratory failure due to aspiration pneumonia in the setting of acute metabolic encephalopathy.  Multiple discussions with patient family.  CODE STATUS changed to DNR/DNI.  Respiratory failure and encephalopathy resolved. Patient received 7 days of Vanco and cefepime.  Antibiotics was de-escalated to p.o. amoxicillin for her chronic thoracolumbar osteomyelitis.  Oral ciprofloxacin was also added later. On dysphagia 3 diet per SLP recommendation but p.o. intake remained poor.  PEG tube placed on 3/21.  Thoracolumbar wound continues to require aggressive wound care  Subjective: Patient was seen and examined this morning. Pleasant elderly African-American female. Lying down in bed. Not in distress.  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  -CT of the abdomen pelvis did not show any acute osteomyelitis. -CT L-spine showed chronic degenerative changes at T11-L1 but could not exclude chronic osteomyelitis -Patient was treated with IV vancomycin and and IV cefepime 3/10-3/16 which was later switched to p.o. Amoxil 3/16 for 4 weeks.   -3/22, repeat CT scan of lumbar spine was obtained which did not show any convincing evidence of acute osteomyelitis. -Wound culture from 3/22 grew Pseudomonas aeruginosa and Klebsiella pneumonia.  Oral ciprofloxacin twice a day was added as well for 7 days -Patient will follow up with ID as an outpatient in 4 weeks.  Patient will also follow-up with wound clinic as an outpatient. She has an appointment for April 1.   Acute hypoxic respiratory failure 2/2 aspiration event -CTA chest on 3/12 with complete collapse of RLL with large debris's in LLL bronchus intermedius -Continue aspiration precautions -Antibiotics as above.   Dysphagia -PEG tube placed on 3/29.  Currently on tube feeding.  Continue the same at home  Acute metabolic encephalopathy Chronic dementia without behavioral disturbance -Oriented to self, place and person with seem to be her baseline. -Discontinued Aricept in the setting of bradycardia. -Continue Namenda. -Reorientation and delirium precautions.  Sinus bradycardia:  -HR improved after Aricept was held.   Hypovolemia/hypernatremia: -Likely due to poor p.o. intake. Resolved. -Continue monitoring Recent Labs  Lab 11/01/20 0500 11/02/20 0445 11/03/20 0603 11/04/20 0501  NA 140 139 138 138   Hypokalemia/hypophosphatemia/hypomagnesemia:  -Electrolyte levels were low because of poor oral intake.  Replaced as needed.. 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  Hemoglobin is chronically low.  No active bleeding though.  Acute on chronic normocytic anemia -Gradual drop in H&H but seems to be stable now between 7 and 8. No report of melena or hematochezia. -Anemia panel consistent with ACD.  Could be due to poor nutrition. -Hemoglobin low at 7 on 3/28.  1 unit of PRBC given.  Improved to 8.1 today Recent Labs    03/09/20 1011 05/05/20 1429 07/29/20 0500 07/29/20 0908 07/29/20 1520 10/29/20 1441 10/30/20 0100 11/03/20 0603 11/04/20 0501 11/05/20 0352 11/06/20 0354 11/07/20 0858  HGB  --    < > 7.7* 7.6*   < >  --    < > 7.8* 7.2* 7.2* 7.0* 8.1*  MCV  --    < > 94.7 95.6   < >  --    < > 105.7* 107.7* 105.9* 106.4* 104.7*  VITAMINB12 754  --  743  --   --  671  --   --   --   --   --   --   FOLATE 12.6  --   --   --   --  7.6  --   --   --   --   --   --   FERRITIN  --   --   --   --   --  153  --   --   --   --   --   --   TIBC  --   --  115*  --   --  92*  --   --   --   --   --   --   IRON  --   --  16*  --   --  52  --   --   --   --   --   --   RETICCTPCT  --   --   --  3.1  --  3.2*  --   --   --   --   --   --    < > = values in this interval not displayed.   Recurrent hypoglycemia:  -Improved after tube feeding was started. Recent Labs  Lab 11/04/20 2051 11/05/20 0736 11/05/20 1711 11/06/20 0604 11/06/20 1602  GLUCAP 113* 101* 89 115* 121*   Wound care: Wound / Incision (Open or Dehisced) 05/27/20 (MASD) Moisture Associated Skin Damage Perineum Right;Left (Active)  Date First Assessed/Time First Assessed: 05/27/20 0610   Wound Type: (MASD) Moisture Associated Skin Damage  Location: Perineum  Location Orientation: Right;Left    Assessments 05/27/2020  6:10 AM 05/29/2020 10:31 PM  Dressing Type -- None  Site / Wound  Assessment -- Clean;Dry  Treatment Other (Comment) Cleansed;Other (Comment)     No Linked orders to display     Pressure Injury 10/19/20 Vertebral column Left;Mid Unstageable - Full thickness tissue loss in which the base of the injury is covered by slough (yellow, tan, gray, green or brown) and/or eschar (tan, brown or black) in the wound bed. eschar d/t removal  (Active)  Date First Assessed/Time First Assessed: 10/19/20 2020   Location: Vertebral column  Location Orientation: Left;Mid  Staging: Unstageable - Full thickness tissue loss in which the base of the injury is covered by slough (yellow,  tan, gray, green or br...    Assessments 10/24/2020 11:45 AM 11/06/2020 11:00 PM  Dressing Type Santyl;Foam - Lift dressing to assess site every shift Moist to dry;Foam - Lift dressing to assess site every shift  Dressing Changed;Clean;Intact;New drainage Changed  Dressing Change Frequency Daily Daily  State of Healing Eschar Eschar  Site / Wound Assessment -- Yellow;Red;Pink  Peri-wound Assessment Erythema (non-blanchable);Pink --  Wound Length (cm) 6.5 cm --  Wound Width (cm) 5 cm --  Wound Depth (cm) 0 cm --  Wound Surface Area (cm^2) 32.5 cm^2 --  Wound Volume (cm^3) 0 cm^3 --  Drainage Amount Minimal --  Drainage Description Purulent Purulent;Sanguineous  Treatment Cleansed Cleansed     No Linked orders to display     Pressure Injury 10/19/20 Buttocks Left Stage 4 - Full thickness tissue loss with exposed bone, tendon or muscle. 7x2x3, tunneling at 12'oclock-6cm, 9'oclock-1.5cm. (Active)  Date First Assessed/Time First Assessed: 10/19/20 2020   Location: Buttocks  Location Orientation: Left  Staging: Stage 4 - Full thickness tissue loss with exposed bone, tendon or muscle.  Wound Description (Comments): 7x2x3, tunneling at 12'oclock-6c...    Assessments 10/24/2020 11:45 AM 11/06/2020 11:00 PM  Dressing Type Foam - Lift dressing to assess site every shift;Moist to dry Moist to dry;Foam - Lift  dressing to assess site every shift  Dressing Changed Changed  Dressing Change Frequency Daily Daily  State of Healing -- Non-healing  Margins Attached edges (approximated) --  Drainage Amount Scant --  Drainage Description Purulent;Serosanguineous --  Treatment Cleansed;Packing (Saline gauze) Cleansed     No Linked orders to display     Pressure Injury 10/19/20 Buttocks Mid Stage 3 -  Full thickness tissue loss. Subcutaneous fat may be visible but bone, tendon or muscle are NOT exposed. intragluteal cluster, 8.5x5 (Active)  Date First Assessed/Time First Assessed: 10/19/20 2020   Location: Buttocks  Location Orientation: Mid  Staging: Stage 3 -  Full thickness tissue loss. Subcutaneous fat may be visible but bone, tendon or muscle are NOT exposed.  Wound Description (Com...    Assessments 10/24/2020 11:45 AM 11/06/2020 11:00 PM  Dressing Type Foam - Lift dressing to assess site every shift Moist to dry;Foam - Lift dressing to assess site every shift  Dressing Changed;Clean;Intact Changed  Dressing Change Frequency Daily Daily  State of Healing Epithelialized --  Site / Wound Assessment Clean;Dry;Pink;Red;Yellow --  Peri-wound Assessment Intact;Erythema (non-blanchable) --  Margins Attached edges (approximated) --  Drainage Amount None --  Treatment Cleansed Cleansed     No Linked orders to display     Pressure Injury 11/05/20 Labia Right;Left Stage 3 -  Full thickness tissue loss. Subcutaneous fat may be visible but bone, tendon or muscle are NOT exposed. open skin on labia (Active)  Date First Assessed/Time First Assessed: 11/05/20 2230   Location: Labia  Location Orientation: Right;Left  Staging: Stage 3 -  Full thickness tissue loss. Subcutaneous fat may be visible but bone, tendon or muscle are NOT exposed.  Wound Description ...    Assessments 11/06/2020  9:15 AM 11/06/2020 11:00 PM  Dressing Type -- None  Treatment Cleansed;Other (Comment) Cleansed;Other (Comment)     No Linked  orders to display    Discharge Exam:   Vitals:   11/06/20 1505 11/06/20 1609 11/06/20 2048 11/07/20 0613  BP: 113/60 118/63 117/64 (!) 116/52  Pulse: 91 86 90 88  Resp: 16 13 14 16   Temp: 98.7 F (37.1 C) 98.7 F (37.1 C) 99.7 F (37.6  C) 100 F (37.8 C)  TempSrc:  Oral    SpO2: 94% 97% 94% 96%  Weight:      Height:        Body mass index is 24.72 kg/m.  General exam: Elderly African-American female.  Lying down in bed.  Not in distress.  Slow to respond Skin: No rashes, lesions or ulcers. HEENT: Atraumatic, normocephalic, no obvious bleeding Lungs: Clear to auscultation bilaterally CVS: Regular rate and rhythm, no murmur GI/Abd soft, nontender, nondistended, bowel sound present, PEG tube site intact CNS: Alert, awake, oriented to place Psychiatry: Depressed look Extremities: No pedal edema, no calf tenderness  Follow ups:   Discharge Instructions    Diet general   Complete by: As directed    Tube feeding   Discharge wound care:   Complete by: As directed    Cleanse wounds to spine and sacrum/left buttocks with NS and pat dry.  Apply Santyl to open wounds.  Cover wth NS moist gauze.  Secure with foam dressing.   Will order low air loss bed Will offload heels with PRevalon boots   Discharge wound care:   Complete by: As directed    Cleanse wounds to spine and sacrum/left buttocks with NS and pat dry.  Apply Santyl to open wounds.  Cover wth NS moist gauze.  Secure with foam dressing.   Will order low air loss bed Will offload heels with PRevalon boots   Increase activity slowly   Complete by: As directed    Increase activity slowly   Complete by: As directed       Follow-up Information    [name], MD Follow up.   Specialty: Internal Medicine Contact information: [address] [place] [address] [phone]        Jodelle Red, MD .   Specialty: Cardiology Contact information: [address] [place]  [address] [phone]        [facility] Follow up.   Why: agency will provide home health nurse, physical therapy, aide, social work Contact information: [address] [place] [address] [phone]               Recommendations for Outpatient Follow-Up:   1. Follow-up with PCP as an outpatient  Discharge Instructions:  Follow with Primary MD [name], MD in 7 days   Get CBC/BMP checked in next visit within 1 week by PCP or SNF MD ( we routinely change or add medications that can affect your baseline labs and fluid status, therefore we recommend that you get the mentioned basic workup next visit with your PCP, your PCP may decide not to get them or add new tests based on their clinical decision)  On your next visit with your PCP, please Get Medicines reviewed and adjusted.  Please request your PCP  to go over all Hospital Tests and Procedure/Radiological results at the follow up, please get all Hospital records sent to your Prim MD by signing hospital release before you go home.  Activity: As tolerated with Full fall precautions use walker/cane & assistance as needed  For Heart failure patients - Check your Weight same time everyday, if you gain over 2 pounds, or you develop in leg swelling, experience more shortness of breath or chest pain, call your Primary MD immediately. Follow Cardiac Low Salt Diet and 1.5 lit/day fluid restriction.  If you have smoked or chewed Tobacco in the last 2 yrs please stop smoking, stop any regular  Alcohol  and or any Recreational drug use.  If you experience worsening of your admission symptoms, develop shortness of breath, life threatening emergency, suicidal or homicidal thoughts you must seek medical attention immediately by calling 911 or calling your MD immediately  if symptoms less severe.  You Must read complete instructions/literature along with all the possible adverse reactions/side effects  for all the Medicines you take and that have been prescribed to you. Take any new Medicines after you have completely understood and accpet all the possible adverse reactions/side effects.   Do not drive, operate heavy machinery, perform activities at heights, swimming or participation in water activities or provide baby sitting services if your were admitted for syncope or siezures until you have seen by Primary MD or a Neurologist and advised to do so again.  Do not drive when taking Pain medications.  Do not take more than prescribed Pain, Sleep and Anxiety Medications  Wear Seat belts while driving.   Please note You were cared for by a hospitalist during your hospital stay. If you have any questions about your discharge medications or the care you received while you were in the hospital after you are discharged, you can call the unit and asked to speak with the hospitalist on call if the hospitalist that took care of you is not available. Once you are discharged, your primary care physician will handle any further medical issues. Please note that NO REFILLS for any discharge medications will be authorized once you are discharged, as it is imperative that you return to your primary care physician (or establish a relationship with a primary care physician if you do not have one) for your aftercare needs so that they can reassess your need for medications and monitor your lab values.    Allergies as of 11/07/2020      Reactions   T-pa [alteplase] Swelling   Does okay with it if premedicated with benadryl.   Codeine Itching      Medication List    STOP taking these medications   amLODipine 10 MG tablet Commonly known as: NORVASC   donepezil 10 MG tablet Commonly known as: ARICEPT   furosemide 40 MG tablet Commonly known as: LASIX   lisinopril 5 MG tablet Commonly known as: ZESTRIL     TAKE these medications   acetaminophen 325 MG tablet Commonly known as: TYLENOL Take 2  tablets (650 mg total) by mouth every 4 (four) hours as needed for mild pain or moderate pain (or Fever >/= 101).   amoxicillin 500 MG capsule Commonly known as: AMOXIL Take 1 capsule (500 mg total) by mouth every 8 (eight) hours for 23 days.   atorvastatin 20 MG tablet Commonly known as: LIPITOR Take 20 mg by mouth every evening.   busPIRone 30 MG tablet Commonly known as: BUSPAR Take 15 mg by mouth 2 (two) times daily.   cholecalciferol 25 MCG (1000 UNIT) tablet Commonly known as: VITAMIN D3 Take 1,000 Units by mouth daily.   ciprofloxacin 500 MG tablet Commonly known as: CIPRO Take 1 tablet (500 mg total) by mouth 2 (two) times daily for 7 days.   collagenase ointment Commonly known as: SANTYL Apply topically daily.   dronabinol 2.5 MG capsule Commonly known as: MARINOL Take 2.5 mg by mouth at bedtime.   Eliquis 5 MG Tabs tablet Generic drug: apixaban Take 5 mg by mouth 2 (two) times daily.   feeding supplement Liqd Take 237 mLs by mouth 3 (three) times daily between meals.  What changed: Another medication with the same name was added. Make sure you understand how and when to take each.   nutrition supplement (JUVEN) Pack Place 1 packet into feeding tube 2 (two) times daily between meals. What changed: You were already taking a medication with the same name, and this prescription was added. Make sure you understand how and when to take each.   feeding supplement (PROSource TF) liquid Place 45 mLs into feeding tube 2 (two) times daily. What changed: You were already taking a medication with the same name, and this prescription was added. Make sure you understand how and when to take each.   feeding supplement (OSMOLITE 1.5 CAL) Liqd Place 1,000 mLs into feeding tube continuous. What changed: You were already taking a medication with the same name, and this prescription was added. Make sure you understand how and when to take each.   ferrous sulfate 325 (65 FE) MG  tablet Take 1 tablet (325 mg total) by mouth daily with breakfast.   free water Soln Place 100 mLs into feeding tube every 6 (six) hours.   Linzess 145 MCG Caps capsule Generic drug: linaclotide Take 145 mcg by mouth daily.   memantine 28 MG Cp24 24 hr capsule Commonly known as: NAMENDA XR Take 1 capsule (28 mg total) by mouth daily. What changed: when to take this   MULTI-VITAMIN GUMMIES PO Take 1 each by mouth daily.   nitroGLYCERIN 0.4 MG SL tablet Commonly known as: NITROSTAT Place 1 tablet (0.4 mg total) under the tongue every 5 (five) minutes as needed for chest pain.   oxyCODONE 15 MG immediate release tablet Commonly known as: ROXICODONE Take 7.5-15 mg by mouth 4 (four) times daily as needed for pain.   polyethylene glycol 17 g packet Commonly known as: MIRALAX / GLYCOLAX Take 17 g by mouth 2 (two) times daily.   potassium chloride 10 MEQ tablet Commonly known as: KLOR-CON Take 10 mEq by mouth daily.   prochlorperazine 10 MG tablet Commonly known as: COMPAZINE Take 10 mg by mouth every 6 (six) hours as needed for nausea or vomiting.   Restasis 0.05 % ophthalmic emulsion Generic drug: cycloSPORINE Place 1 drop into both eyes daily as needed (dry eyes).   vitamin C 1000 MG tablet Take 1,000 mg by mouth daily.            Discharge Care Instructions  (From admission, onward)         Start     Ordered   11/04/20 0000  Discharge wound care:       Comments: Cleanse wounds to spine and sacrum/left buttocks with NS and pat dry.  Apply Santyl to open wounds.  Cover wth NS moist gauze.  Secure with foam dressing.   Will order low air loss bed Will offload heels with PRevalon boots   11/04/20 1147   11/02/20 0000  Discharge wound care:       Comments: Cleanse wounds to spine and sacrum/left buttocks with NS and pat dry.  Apply Santyl to open wounds.  Cover wth NS moist gauze.  Secure with foam dressing.   Will order low air loss bed Will offload heels with  PRevalon boots   11/02/20 1411          Time coordinating discharge: 35 minutes  The results of significant diagnostics from this hospitalization (including imaging, microbiology, ancillary and laboratory) are listed below for reference.    Procedures and Diagnostic Studies:   CT ABDOMEN PELVIS W CONTRAST  Result Date: 10/19/2020 CLINICAL DATA:  Abdominal abscess/infection. Drainage from back wounds. EXAM: CT ABDOMEN AND PELVIS WITH CONTRAST TECHNIQUE: Multidetector CT imaging of the abdomen and pelvis was performed using the standard protocol following bolus administration of intravenous contrast. CONTRAST:  OMNIPAQUE IOHEXOL 300 MG/ML  SOLN COMPARISON:  07/21/2020 FINDINGS: Lower chest: Mild basilar scarring.  No active lower chest disease. Hepatobiliary: No liver parenchymal lesion. Chronic intra and extrahepatic biliary ductal dilatation which may be becoming more prominent over time. The common duct appears to be dilated all the way to the ampulla. No calcified gallstones are seen. The gallbladder is distended. Pancreas: No pancreatic parenchymal lesion is seen. Pancreatic duct is mildly dilated. Spleen: Negative Adrenals/Urinary Tract: No adrenal or renal lesion. Bladder is unremarkable. Stomach/Bowel: Moderate amount of fecal matter in the colon. No other bowel finding. Vascular/Lymphatic: Aortic atherosclerosis. No aneurysm. IVC is normal. No adenopathy. Reproductive: Uterine leiomyomas.  No significant pelvic mass. Other: No free fluid or air. 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 No evidence of significant soft tissue abscess collection. See results of lumbar CT. IMPRESSION: 1. 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 No evidence of significant soft tissue abscess collection. See results of lumbar CT. 2. Chronic intra  and extrahepatic biliary ductal dilatation which may be becoming more prominent over time. The common duct appears to be dilated all the way to the ampulla. No calcified gallstones are seen. The gallbladder is distended. Consider ultrasound and/or ERCP if there is clinical evidence of biliary obstruction. 3. Aortic atherosclerosis. 4. Uterine leiomyomas. 5. Moderate amount of fecal matter in the colon. Aortic Atherosclerosis (ICD10-I70.0).  A Electronically Signed   By: Paulina Fusi M.D.   On: 10/19/2020 18:24   CT L-SPINE NO CHARGE  Result Date: 10/19/2020 CLINICAL DATA:  Decubitus ulcer.  In EXAM: CT LUMBAR SPINE WITHOUT CONTRAST TECHNIQUE: Multidetector CT imaging of the lumbar spine was performed without intravenous contrast administration. Multiplanar CT image reconstructions were also generated. COMPARISON:  CT abdomen same day.  Previous lumbar radiographs. FINDINGS: Segmentation: 5 lumbar type vertebral bodies. Alignment: Thoracolumbar curvature convex to the right and lower lumbar curvature convex to the left. Vertebrae: There is been previous fusion from L1 to the sacrum, with hardware removal. Screw shadows remain visible in the pedicles. There appears to be solid union throughout that segment. There are hypertrophic sclerotic changes of the posterior elements at T11, T12 and L1 which could relate to chronic degenerative change, but the possibility of chronic osteomyelitis in that region is not excluded. There is no gross destructive bone change. The posterior elements at T11, T12 and L1 are immediately at the area of the decubitus ulcer. Increased kyphotic curvature in that region probably places the overlying soft tissue at risk. There are old minor compression deformities at T11 and T12 Paraspinal and other soft tissues: No evidence of para spinous soft tissue abscess. Disc levels: As noted above, distant fusion through the lumbosacral region with sufficient patency of the canal. IMPRESSION: 1.  Previous fusion from L1 to the sacrum with hardware removal. There appears to be solid union throughout that segment. 2. Hypertrophic sclerotic changes of the posterior elements at T11, T12 and L1 which could relate to chronic degenerative change, but the possibility of chronic osteomyelitis in that region is not excluded. There is no gross destructive bone change. There is increased kyphotic curvature  in that region which probably places the overlying soft tissue at risk. There is no evidence of para spinous soft tissue abscess. The posterior elements of T11, T12 and L1 are closely related to the area of decubitus ulcer. Electronically Signed   By: Paulina FusiMark  Shogry M.D.   On: 10/19/2020 18:31     Labs:   Basic Metabolic Panel: Recent Labs  Lab 11/01/20 0500 11/02/20 0445 11/03/20 0603 11/04/20 0501  NA 140 139 138 138  K 3.3* 4.2 4.8 4.8  CL 110 109 107 107  CO2 24 24 26 26   GLUCOSE 179* 142* 120* 131*  BUN 10 18 24* 26*  CREATININE 0.32* 0.45 <0.30* <0.30*  CALCIUM 8.0* 7.9* 8.1* 8.3*  MG 1.9 1.7 1.7 1.6*  PHOS 2.2* 1.3*  1.4* 2.8 3.0   GFR CrCl cannot be calculated (This lab value cannot be used to calculate CrCl because it is not a number: <0.30). Liver Function Tests: Recent Labs  Lab 11/01/20 0500 11/02/20 0445 11/03/20 0603 11/04/20 0501  ALBUMIN 1.5* 1.4* 1.7* 1.6*   No results for input(s): LIPASE, AMYLASE in the last 168 hours. No results for input(s): AMMONIA in the last 168 hours. Coagulation profile No results for input(s): INR, PROTIME in the last 168 hours.  CBC: Recent Labs  Lab 11/03/20 0603 11/04/20 0501 11/05/20 0352 11/06/20 0354 11/07/20 0858  WBC 10.8* 9.1 7.7 7.4 7.2  HGB 7.8* 7.2* 7.2* 7.0* 8.1*  HCT 25.8* 23.9* 23.5* 23.2* 26.9*  MCV 105.7* 107.7* 105.9* 106.4* 104.7*  PLT 162 163 173 172 167   Cardiac Enzymes: No results for input(s): CKTOTAL, CKMB, CKMBINDEX, TROPONINI in the last 168 hours. BNP: Invalid input(s): POCBNP CBG: Recent  Labs  Lab 11/04/20 2051 11/05/20 0736 11/05/20 1711 11/06/20 0604 11/06/20 1602  GLUCAP 113* 101* 89 115* 121*   D-Dimer No results for input(s): DDIMER in the last 72 hours. Hgb A1c No results for input(s): HGBA1C in the last 72 hours. Lipid Profile No results for input(s): CHOL, HDL, LDLCALC, TRIG, CHOLHDL, LDLDIRECT in the last 72 hours. Thyroid function studies No results for input(s): TSH, T4TOTAL, T3FREE, THYROIDAB in the last 72 hours.  Invalid input(s): FREET3 Anemia work up No results for input(s): VITAMINB12, FOLATE, FERRITIN, TIBC, IRON, RETICCTPCT in the last 72 hours. Microbiology Recent Results (from the past 240 hour(s))  Aerobic Culture w Gram Stain (superficial specimen)     Status: None   Collection Time: 10/31/20 11:00 PM   Specimen: Wound  Result Value Ref Range Status   Specimen Description   Final    WOUND Performed at [facility], [address] [place], [place] [address]    Special Requests   Final    NONE Performed at [facility], [address] [place], [place] [address]    Gram Stain   Final    ABUNDANT WBC PRESENT,BOTH PMN AND MONONUCLEAR RARE GRAM NEGATIVE RODS Performed at [facility], [address] [place], [place] [address]    Culture   Final    MODERATE PSEUDOMONAS AERUGINOSA MODERATE KLEBSIELLA PNEUMONIAE    Report Status 11/04/2020 FINAL  Final   Organism ID, Bacteria PSEUDOMONAS AERUGINOSA  Final   Organism ID, Bacteria KLEBSIELLA PNEUMONIAE  Final      Susceptibility   Klebsiella pneumoniae - MIC*    AMPICILLIN >=32 RESISTANT Resistant     CEFAZOLIN <=4 SENSITIVE Sensitive     CEFEPIME <=0.12 SENSITIVE Sensitive     CEFTAZIDIME <=1 SENSITIVE Sensitive  CEFTRIAXONE <=0.25 SENSITIVE Sensitive     CIPROFLOXACIN <=0.25 SENSITIVE Sensitive     GENTAMICIN <=1 SENSITIVE Sensitive     IMIPENEM <=0.25 SENSITIVE Sensitive     TRIMETH/SULFA <=20 SENSITIVE Sensitive      AMPICILLIN/SULBACTAM 8 SENSITIVE Sensitive     PIP/TAZO <=4 SENSITIVE Sensitive     * MODERATE KLEBSIELLA PNEUMONIAE   Pseudomonas aeruginosa - MIC*    CEFTAZIDIME 4 SENSITIVE Sensitive     CIPROFLOXACIN <=0.25 SENSITIVE Sensitive     GENTAMICIN <=1 SENSITIVE Sensitive     IMIPENEM 2 SENSITIVE Sensitive     PIP/TAZO 8 SENSITIVE Sensitive     CEFEPIME 4 SENSITIVE Sensitive     * MODERATE PSEUDOMONAS AERUGINOSA     Signed: Rashed Edler  [facility] 11/07/2020, 11:11 AM

## 2020-11-07 NOTE — Progress Notes (Signed)
[facility] ([facility]) Community Based Palliative Care       This patient is enrolled in our palliative care services in the community.  [facility] will continue to follow for any discharge planning needs and to coordinate continuation of palliative care.       Thank you for the opportunity to participate in this patient's care.     Chrislyn King, BSN, RN [facility] Hospital Liaison   [phone] (24 h on call)  

## 2020-11-07 NOTE — Progress Notes (Signed)
Pt leaving to home via [facility] at this time. Prior to dc, tele , IV site, and Midline site DC'd. Pt changed and disposable gown placed on. Pt stable at time of DC. AVS placed in packet.

## 2020-11-09 ENCOUNTER — Telehealth: Payer: Self-pay

## 2020-11-09 NOTE — Telephone Encounter (Signed)
Phone call placed to patient/Caregiver to check in after recent hospitalization. Phone rang with no answer.

## 2020-11-10 ENCOUNTER — Other Ambulatory Visit: Payer: Self-pay

## 2020-11-10 ENCOUNTER — Encounter (HOSPITAL_BASED_OUTPATIENT_CLINIC_OR_DEPARTMENT_OTHER): Payer: Medicare Other | Attending: Internal Medicine | Admitting: Internal Medicine

## 2020-11-10 DIAGNOSIS — Z87891 Personal history of nicotine dependence: Secondary | ICD-10-CM | POA: Diagnosis not present

## 2020-11-10 DIAGNOSIS — L89153 Pressure ulcer of sacral region, stage 3: Secondary | ICD-10-CM | POA: Insufficient documentation

## 2020-11-10 DIAGNOSIS — L89323 Pressure ulcer of left buttock, stage 3: Secondary | ICD-10-CM | POA: Insufficient documentation

## 2020-11-10 DIAGNOSIS — Z7901 Long term (current) use of anticoagulants: Secondary | ICD-10-CM | POA: Insufficient documentation

## 2020-11-10 DIAGNOSIS — F0150 Vascular dementia without behavioral disturbance: Secondary | ICD-10-CM | POA: Insufficient documentation

## 2020-11-10 DIAGNOSIS — Z931 Gastrostomy status: Secondary | ICD-10-CM | POA: Insufficient documentation

## 2020-11-10 DIAGNOSIS — Z86718 Personal history of other venous thrombosis and embolism: Secondary | ICD-10-CM | POA: Diagnosis not present

## 2020-11-10 DIAGNOSIS — I428 Other cardiomyopathies: Secondary | ICD-10-CM | POA: Insufficient documentation

## 2020-11-10 DIAGNOSIS — I10 Essential (primary) hypertension: Secondary | ICD-10-CM | POA: Insufficient documentation

## 2020-11-10 NOTE — Progress Notes (Signed)
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 is distinct with the outline attached to the wound base. There is large (67-100%) red, pink granulation within the wound bed. There is a small (1-33%) amount of necrotic tissue within the wound bed including Adherent Slough. Wound #6 status is Open. Original cause of wound was Surgical Injury. The date acquired was: 09/12/2020. The wound is located on the  Midline Back. The wound measures 4.5cm length x 5cm width x 1cm depth; 17.671cm^2 area and 17.671cm^3 volume. There is Fat Layer (Subcutaneous Tissue) exposed. There is no tunneling noted, however, there is undermining starting at 12:00 and ending at 12:00 with a maximum distance of 5.7cm. There is a large amount of serosanguineous drainage noted. The  wound margin is distinct with the outline attached to the wound base. There is medium (34-66%) red, pink granulation within the wound bed. There is a medium (34-66%) amount of necrotic tissue within the wound bed including Adherent Slough. Assessment Active Problems ICD-10 Pressure ulcer of unspecified part of back, stage 3 Pressure ulcer of sacral region, stage 3 Pressure ulcer of left buttock, stage 3 Disruption of external operation (surgical) wound, not elsewhere classified, initial encounter Procedures Wound #3 Pre-procedure diagnosis of Wound #3 is a Pressure Ulcer located on the Sacrum . There was a Excisional Skin/Subcutaneous Tissue Debridement with a total area of 8 sq cm performed by Maxwell Caulobson, Rayn Enderson G., MD. With the following instrument(s): Curette to remove Non-Viable tissue/material. Material removed includes Subcutaneous Tissue and Slough and after achieving pain control using Other (Benzocaine). No specimens were taken. A time out was conducted at 10:09, prior to the start of the procedure. A Moderate amount of bleeding was controlled with Pressure. The procedure was tolerated well. Post Debridement Measurements: 4cm length x 2cm width x 0.5cm depth; 3.142cm^3 volume. Post debridement Stage noted as Category/Stage II. Character of Wound/Ulcer Post Debridement is improved. Post procedure Diagnosis Wound #3: Same as Pre-Procedure Wound #6 Pre-procedure diagnosis of Wound #6 is a Pressure Ulcer located on the Midline Back . There was a Excisional Skin/Subcutaneous Tissue Debridement with a total area of 22.5 sq cm performed by Maxwell Caulobson, Abisai Coble G., MD. With the following instrument(s): Curette to remove Non-Viable tissue/material. Material removed includes Subcutaneous Tissue and Slough and after achieving pain control using Other (Benzocaine). No specimens were taken. A time out was conducted at 10:09, prior to the start of the procedure. A Moderate amount of bleeding was controlled  with Pressure. The procedure was tolerated well. Post Debridement Measurements: 4.5cm length x 5cm width x 1cm depth; 17.671cm^3 volume. Post debridement Stage noted as Category/Stage III. Character of Wound/Ulcer Post Debridement is improved. Post procedure Diagnosis Wound #6: Same as Pre-Procedure Plan Follow-up Appointments: Return Appointment in 2 weeks. Off-Loading: Low air-loss mattress (Group 2) Turn and reposition every 2 hours Additional Orders / Instructions: Follow Nutritious Diet - Continue ProStat Home Health: Admit to Home Health for wound care. May utilize formulary equivalent dressing for wound treatment orders unless otherwise specified. - [facility] for [facility]ound Care 2-3x week WOUND #3: - Sacrum Wound Laterality: Cleanser: Soap and Water [facility](Home Health) Every Other Day/30 Days Discharge Instructions: May shower and wash wound with dial antibacterial soap and water prior to dressing change. Prim Dressing: Promogran Prisma Matrix, 4.34 (sq in) (silver collagen) (Home Health) Every Other Day/30 Days ary Discharge Instructions: Moisten collagen with saline or hydrogel Secondary Dressing: Woven Gauze Sponge, Non-Sterile 4x4 in [facility](Home Health) Every Other Day/30 Days Discharge Instructions: Apply saline moist over primary then dry gauze Secondary Dressing: ComfortFoam Border, 4x4 in (silicone border) [facility](Home Health) Every Other Day/30 Days Discharge Instructions: Apply over primary dressing as directed. WOUND #4: - Gluteus Wound Laterality: Left, Proximal Cleanser: Soap and Water [facility](Home Health) Every Other Day/30 Days Discharge Instructions: May shower and wash wound with dial antibacterial soap and water prior to dressing change. Prim Dressing: Promogran Prisma Matrix, 4.34 (sq  in) (silver collagen) (Home Health) Every Other Day/30 Days ary Discharge Instructions: Moisten collagen with saline or hydrogel Secondary Dressing: Woven Gauze Sponge, Non-Sterile 4x4 in [facility])  Every Other Day/30 Days Discharge Instructions: Apply saline moist over primary then dry gauze Secondary Dressing: ComfortFoam Border, 4x4 in (silicone border) [facility]) Every Other Day/30 Days Discharge Instructions: Apply over primary dressing as directed. WOUND #5: - Gluteus Wound Laterality: Left Cleanser: Soap and Water [facility]) Every Other Day/30 Days Discharge Instructions: May shower and wash wound with dial antibacterial soap and water prior to dressing change. Prim Dressing: Promogran Prisma Matrix, 4.34 (sq in) (silver collagen) (Home Health) Every Other Day/30 Days ary Discharge Instructions: Moisten collagen with saline or hydrogel Secondary Dressing: Woven Gauze Sponge, Non-Sterile 4x4 in [facility]) Every Other Day/30 Days Discharge Instructions: Apply saline moist over primary then dry gauze Secondary Dressing: ComfortFoam Border, 4x4 in (silicone border) [facility]) Every Other Day/30 Days Discharge Instructions: Apply over primary dressing as directed. WOUND #6: - Back Wound Laterality: Midline Cleanser: Soap and Water [facility]) Every Other Day/30 Days Discharge Instructions: May shower and wash wound with dial antibacterial soap and water prior to dressing change. Prim Dressing: Promogran Prisma Matrix, 4.34 (sq in) (silver collagen) (Home Health) Every Other Day/30 Days ary Discharge Instructions: Moisten collagen with saline or hydrogel Secondary Dressing: Woven Gauze Sponge, Non-Sterile 4x4 in [facility]) Every Other Day/30 Days Discharge Instructions: Apply saline moist over primary then dry gauze Secondary Dressing: ComfortFoam Border, 6x6 in (silicone border) [facility]) Every Other Day/30 Days Discharge Instructions: Apply over primary dressing as directed. 1. Surgical/pressure ulcer on the mid thoracic back. This had underlying osteomyelitis and infected hardware at some point she is on antibiotics I hope that is followed by infectious disease  currently Amoxil and ciprofloxacin. We are going to use collagen backing wet-to-dry with foam or ABD #2 I am going to use silver collagen to the area on the coccyx and the buttock as well. 3. Follow-up in 2 weeks Electronic Signature(s) Signed: 11/10/2020 10:52:03 AM By: Baltazar Najjar MD Entered By: Baltazar Najjar on 11/10/2020 10:52:02 -------------------------------------------------------------------------------- HxROS Details Patient Name: Date of Service: [name] 11/10/2020 9:00 A M Medical Record Number: [id number] Patient Account Number: [id number] Date of Birth/Sex: Treating RN: [birth year] (79 y.o. Ardis Rowan, [name] Primary Care Provider: [name] Other Clinician: Referring Provider: Treating Provider/Extender: Burnetta Sabin in Treatment: 0 Unable to Obtain Patient History due to Dementia Information Obtained From Patient Constitutional Symptoms (General Health) Complaints and Symptoms: Negative for: Fatigue; Fever; Chills; Marked Weight Change Eyes Complaints and Symptoms: Negative for: Dry Eyes; Vision Changes; Glasses / Contacts Medical History: Negative for: Cataracts; Glaucoma; Optic Neuritis Ear/Nose/Mouth/Throat Complaints and Symptoms: Negative for: Chronic sinus problems or rhinitis Medical History: Negative for: Chronic sinus problems/congestion; Middle ear problems Respiratory Complaints and Symptoms: Negative for: Chronic or frequent coughs; Shortness of Breath Medical History: Negative for: Aspiration; Asthma; Chronic Obstructive Pulmonary Disease (COPD); Pneumothorax; Sleep Apnea; Tuberculosis Cardiovascular Complaints and Symptoms: Negative for: Chest pain Medical History: Positive for: Hypertension Negative for: Angina; Arrhythmia; Congestive Heart Failure; Coronary Artery Disease; Deep Vein Thrombosis; Hypotension; Myocardial Infarction; Peripheral Arterial Disease; Peripheral Venous Disease; Phlebitis;  Vasculitis Gastrointestinal Complaints and Symptoms: Negative for: Frequent diarrhea; Nausea; Vomiting Medical History: Negative for: Cirrhosis ; Colitis; Crohns; Hepatitis A; Hepatitis B; Hepatitis C Endocrine Complaints and Symptoms: Negative for: Heat/cold intolerance Medical History: Negative for: Type I Diabetes; Type II Diabetes Genitourinary Complaints and Symptoms: Negative for: Frequent  urination Medical History: Negative for: End Stage Renal Disease Musculoskeletal Complaints and Symptoms: Negative for: Muscle Pain; Muscle Weakness Medical History: Negative for: Gout; Rheumatoid Arthritis; Osteoarthritis; Osteomyelitis Neurologic Complaints and Symptoms: Negative for: Numbness/parasthesias Medical History: Positive for: Dementia Negative for: Neuropathy; Quadriplegia; Paraplegia; Seizure Disorder Psychiatric Complaints and Symptoms: Negative for: Claustrophobia; Suicidal Medical History: Negative for: Anorexia/bulimia; Confinement Anxiety Hematologic/Lymphatic Medical History: Negative for: Anemia; Hemophilia; Human Immunodeficiency Virus; Lymphedema; Sickle Cell Disease Immunological Medical History: Negative for: Lupus Erythematosus; Raynauds; Scleroderma Integumentary (Skin) Medical History: Negative for: History of Burn Oncologic Medical History: Negative for: Received Chemotherapy; Received Radiation Immunizations Pneumococcal Vaccine: Received Pneumococcal Vaccination: No Implantable Devices None Hospitalization / Surgery History Type of Hospitalization/Surgery OR debridement/ metal plate removal 16/10 Family and Social History Cancer: Yes - Siblings; Diabetes: No; Heart Disease: No; Hereditary Spherocytosis: No; Hypertension: No; Kidney Disease: No; Lung Disease: Yes - Siblings; Seizures: No; Stroke: No; Thyroid Problems: No; Tuberculosis: No; Former smoker; Marital Status - Widowed; Alcohol Use: Never; Drug Use: No History; Caffeine Use: Daily;  Financial Concerns: No; Food, Clothing or Shelter Needs: No; Support System Lacking: No; Transportation Concerns: No Electronic Signature(s) Signed: 11/10/2020 5:01:26 PM By: Fonnie Mu RN Signed: 11/10/2020 5:01:47 PM By: Baltazar Najjar MD Entered By: Fonnie Mu on 11/10/2020 09:21:46 -------------------------------------------------------------------------------- SuperBill Details Patient Name: Date of Service: [name] 11/10/2020 Medical Record Number: [id number] Patient Account Number: [id number] Date of Birth/Sex: Treating RN: Jan 26, 1941 (80 y.o. [name] Primary Care Provider: [name] Other Clinician: Referring Provider: Treating Provider/Extender: Burnetta Sabin in Treatment: 0 Diagnosis Coding ICD-10 Codes Code Description L89.103 Pressure ulcer of unspecified part of back, stage 3 L89.153 Pressure ulcer of sacral region, stage 3 L89.323 Pressure ulcer of left buttock, stage 3 T81.31XA Disruption of external operation (surgical) wound, not elsewhere classified, initial encounter Facility Procedures CPT4 Code: 11914782 Description: 99214 - WOUND CARE VISIT-LEV 4 EST PT Modifier: 25 Quantity: 1 CPT4 Code: 95621308 Description: 11042 - DEB SUBQ TISSUE 20 SQ CM/< ICD-10 Diagnosis Description L89.103 Pressure ulcer of unspecified part of back, stage 3 L89.153 Pressure ulcer of sacral region, stage 3 Modifier: Quantity: 1 CPT4 Code: [id number] Description: 11045 - DEB SUBQ TISS EA ADDL 20CM ICD-10 Diagnosis Description L89.103 Pressure ulcer of unspecified part of back, stage 3 L89.153 Pressure ulcer of sacral region, stage 3 Modifier: Quantity: 1 Physician Procedures : CPT4 Code Description Modifier [id number] 99214 - WC PHYS LEVEL 4 - EST PT ICD-10 Diagnosis Description L89.103 Pressure ulcer of unspecified part of back, stage 3 L89.153 Pressure ulcer of sacral region, stage 3 L89.323 Pressure ulcer of left buttock,   stage 3 T81.31XA Disruption of external operation (surgical) wound, not elsewhere classified, initial encounter Quantity: 1 : [id number] 11042 - WC PHYS SUBQ TISS 20 SQ CM ICD-10 Diagnosis Description L89.103 Pressure ulcer of unspecified part of back, stage 3 L89.153 Pressure ulcer of sacral region, stage 3 Quantity: 1 Electronic Signature(s) Signed: 11/10/2020 10:55:48 AM By: Antonieta Iba Signed: 11/10/2020 5:01:47 PM By: Baltazar Najjar MD Previous Signature: 11/10/2020 10:52:19 AM Version By: Antonieta Iba Entered By: Antonieta Iba on 11/10/2020 10:55:48

## 2020-11-10 NOTE — Progress Notes (Signed)
[name], Sarah MESKILL ([id number]) Visit Report for 11/10/2020 Abuse/Suicide Risk Screen Details Patient Name: Date of Service: [name], [name] 11/10/2020 9:00 A M Medical Record Number: [id number] Patient Account Number: [id number] Date of Birth/Sex: Treating RN: Sep 19, 1940 (80 y.o. [name], Sarah Primary Care Cody [name]: [name] Other Clinician: Referring [name]: Treating [name]/Extender: [name] in Treatment: 0 Abuse/Suicide Risk Screen Items Answer ABUSE RISK SCREEN: Has anyone close to you tried to hurt or harm you recentlyo No Do you feel uncomfortable with anyone in your familyo No Has anyone forced you do things that you didnt want to doo No Electronic Signature(s) Signed: 11/10/2020 5:01:26 PM By: Fonnie Mu RN Entered By: Fonnie Mu on 11/10/2020 09:21:59 -------------------------------------------------------------------------------- Activities of Daily Living Details Patient Name: Date of Service: [name], [name] 11/10/2020 9:00 A M Medical Record Number: [id number] Patient Account Number: [id number] Date of Birth/Sex: Treating RN: 22-Apr-1941 (80 y.o. [name], Sarah Primary Care Evy Lutterman: [name] Other Clinician: Referring Darianne Muralles: Treating Esabella Stockinger/Extender: [name] in Treatment: 0 Activities of Daily Living Items Answer Activities of Daily Living (Please select one for each item) Drive Automobile Not Able T Medications ake Need Assistance Use T elephone Need Assistance Care for Appearance Need Assistance Use T oilet Need Assistance Bath / Shower Need Assistance Dress Self Need Assistance Feed Self Need Assistance Walk Not Able Get In / Out Bed Not Able Housework Not Able Prepare Meals Not Able Handle Money Not Able Shop for Self Not Able Electronic Signature(s) Signed: 11/10/2020 5:01:26 PM By: Fonnie Mu RN Entered By: Fonnie Mu on 11/10/2020  09:22:38 -------------------------------------------------------------------------------- Education Screening Details Patient Name: Date of Service: Sarah Hose D. 11/10/2020 9:00 A M Medical Record Number: [id number] Patient Account Number: [id number] Date of Birth/Sex: Treating RN: 1941-04-23 (80 y.o. [name], Sarah Primary Care Lunetta [name]: [name] Other Clinician: Referring Raihana Balderrama: Treating Sage Hammill/Extender: [name] in Treatment: 0 Learning Preferences/Education Level/Primary Language Learning Preference: Explanation, Demonstration, Communication Board, Printed Material Highest Education Level: High School Preferred Language: English Cognitive Barrier Language Barrier: No Translator Needed: No Memory Deficit: No Emotional Barrier: No Cultural/Religious Beliefs Affecting Medical Care: No Physical Barrier Impaired Vision: No Impaired Hearing: No Decreased Hand dexterity: No Knowledge/Comprehension Knowledge Level: High Comprehension Level: High Ability to understand written instructions: High Ability to understand verbal instructions: High Motivation Anxiety Level: Calm Cooperation: Cooperative Education Importance: Denies Need Interest in Health Problems: Asks Questions Perception: Coherent Willingness to Engage in Self-Management High Activities: Readiness to Engage in Self-Management High Activities: Electronic Signature(s) Signed: 11/10/2020 5:01:26 PM By: Fonnie Mu RN Entered By: Fonnie Mu on 11/10/2020 09:23:06 -------------------------------------------------------------------------------- Fall Risk Assessment Details Patient Name: Date of Service: Sarah Hose D. 11/10/2020 9:00 A M Medical Record Number: [id number] Patient Account Number: [id number] Date of Birth/Sex: Treating RN: August 27, 1940 (79 y.o. [name], Sarah Primary Care Jnaya Butrick: [name] Other Clinician: Referring  [name]: Treating [name]/Extender: [name] in Treatment: 0 Fall Risk Assessment Items Have you had 2 or more falls in the last 12 monthso 0 No Have you had any fall that resulted in injury in the last 12 monthso 0 No FALLS RISK SCREEN History of falling - immediate or within 3 months 0 No Secondary diagnosis (Do you have 2 or more medical diagnoseso) 0 No Ambulatory aid None/bed rest/wheelchair/nurse 0 No Crutches/cane/walker 0 No Furniture 0 No Intravenous therapy Access/Saline/Heparin Lock 0 No Gait/Transferring Normal/ bed rest/ wheelchair 0 No Weak (short steps with or without shuffle, stooped but  able to lift head while walking, may seek 0 No support from furniture) Impaired (short steps with shuffle, may have difficulty arising from chair, head down, impaired 0 No balance) Mental Status Oriented to own ability 0 No Electronic Signature(s) Signed: 11/10/2020 5:01:26 PM By: Fonnie Mu RN Entered By: Fonnie Mu on 11/10/2020 09:23:19 -------------------------------------------------------------------------------- Foot Assessment Details Patient Name: Date of Service: Sarah Hose D. 11/10/2020 9:00 A M Medical Record Number: [id number] Patient Account Number: [id number] Date of Birth/Sex: Treating RN: 06-02-1941 (80 y.o. [name], Sarah Primary Care [name]: [name] Other Clinician: Referring [name]: Treating [name]/Extender: [name] in Treatment: 0 Foot Assessment Items Site Locations + = Sensation present, - = Sensation absent, C = Callus, U = Ulcer R = Redness, W = Warmth, M = Maceration, PU = Pre-ulcerative lesion F = Fissure, S = Swelling, D = Dryness Assessment Right: Left: Other Deformity: No No Prior Foot Ulcer: No No Prior Amputation: No No Charcot Joint: No No Ambulatory Status: Gait: Notes n/a no LE wounds pt. not diabetic Electronic Signature(s) Signed:  11/10/2020 5:01:26 PM By: Fonnie Mu RN Entered By: Fonnie Mu on 11/10/2020 09:23:40 -------------------------------------------------------------------------------- Nutrition Risk Screening Details Patient Name: Date of Service: Sarah Hose D. 11/10/2020 9:00 A M Medical Record Number: [id number] Patient Account Number: [id number] Date of Birth/Sex: Treating RN: 19-Dec-1940 (80 y.o. Sarah Fail Primary Care Lunabella Badgett: [name] Other Clinician: Referring Jase Reep: Treating Reneisha Stilley/Extender: [name] in Treatment: 0 Height (in): Weight (lbs): Body Mass Index (BMI): Nutrition Risk Screening Items Score Screening NUTRITION RISK SCREEN: I have an illness or condition that made me change the kind and/or amount of food I eat 0 No I eat fewer than two meals per day 0 No I eat few fruits and vegetables, or milk products 0 No I have three or more drinks of beer, liquor or wine almost every day 0 No I have tooth or mouth problems that make it hard for me to eat 0 No I don't always have enough money to buy the food I need 0 No I eat alone most of the time 0 No I take three or more different prescribed or over-the-counter drugs a day 0 No Without wanting to, I have lost or gained 10 pounds in the last six months 0 No I am not always physically able to shop, cook and/or feed myself 0 No Nutrition Protocols Good Risk Protocol 0 No interventions needed Moderate Risk Protocol High Risk Proctocol Risk Level: Good Risk Score: 0 Electronic Signature(s) Signed: 11/10/2020 5:01:26 PM By: Fonnie Mu RN Entered By: Fonnie Mu on 11/10/2020 61:60:73

## 2020-11-10 NOTE — Progress Notes (Addendum)
Dino, Sharin GraveWILLIEN D. ([id number]) Visit Report for 11/10/2020 Allergy List Details Patient Name: Date of Service: [name], Sarah D. 11/10/2020 9:00 A M Medical Record Number: [id number] Patient Account Number: [id number] Date of Birth/Sex: Treating RN: [birth year] (80 y.o. Ardis RowanF) Breedlove, [name] Primary Care Rudra Hobbins: Fleet ContrasAvbuere, Edwin Other Clinician: Referring Duilio Heritage: Treating Janna Oak/Extender: Burnetta Sabinobson, Michael Avbuere, Edwin Weeks in Treatment: 0 Allergies Active Allergies codeine alteplase Allergy Notes Electronic Signature(s) Signed: 11/10/2020 5:01:26 PM By: Fonnie MuBreedlove, [name] RN Entered By: Fonnie MuBreedlove, [name] on 11/10/2020 09:20:14 -------------------------------------------------------------------------------- Arrival Information Details Patient Name: Date of Service: Sarah HoseMCNEIL, Sarah D. 11/10/2020 9:00 A M Medical Record Number: [id number] Patient Account Number: [id number] Date of Birth/Sex: Treating RN: [birth year] (80 y.o. Ardis RowanF) Breedlove, [name] Primary Care Nollan Muldrow: Fleet ContrasAvbuere, Edwin Other Clinician: Referring Alastair Hennes: Treating Glena Pharris/Extender: Burnetta Sabinobson, Michael Avbuere, Edwin Weeks in Treatment: 0 Visit Information Patient Arrived: Stretcher Arrival Time: 09:15 Accompanied By: emt Transfer Assistance: Manual Patient Identification Verified: Yes Secondary Verification Process Completed: Yes Patient Requires Transmission-Based Precautions: No History Since Last Visit Added or deleted any medications: No Any new allergies or adverse reactions: No Had a fall or experienced change in activities of daily living that may affect risk of falls: No Signs or symptoms of abuse/neglect since last visito No Hospitalized since last visit: No Implantable device outside of the clinic excluding cellular tissue based products placed in the center since last visit: No Has Dressing in Place as Prescribed: Yes Electronic Signature(s) Signed: 11/10/2020 5:01:26 PM By: Fonnie MuBreedlove, [name] RN Entered  By: Fonnie MuBreedlove, [name] on 11/10/2020 09:15:30 -------------------------------------------------------------------------------- Clinic Level of Care Assessment Details Patient Name: Date of Service: [name], Sarah D. 11/10/2020 9:00 A M Medical Record Number: [id number] Patient Account Number: [id number] Date of Birth/Sex: Treating RN: [birth year] (80 y.o. Roel CluckF) Barnhart, Jodi Primary Care Aaleyah Witherow: Fleet ContrasAvbuere, Edwin Other Clinician: Referring Keysean Savino: Treating Saphire Barnhart/Extender: Burnetta Sabinobson, Michael Avbuere, Edwin Weeks in Treatment: 0 Clinic Level of Care Assessment Items TOOL 1 Quantity Score X- 1 0 Use when EandM and Procedure is performed on INITIAL visit ASSESSMENTS - Nursing Assessment / Reassessment X- 1 20 General Physical Exam (combine w/ comprehensive assessment (listed just below) when performed on new pt. evals) X- 1 25 Comprehensive Assessment (HX, ROS, Risk Assessments, Wounds Hx, etc.) ASSESSMENTS - Wound and Skin Assessment / Reassessment []  - 0 Dermatologic / Skin Assessment (not related to wound area) ASSESSMENTS - Ostomy and/or Continence Assessment and Care []  - 0 Incontinence Assessment and Management []  - 0 Ostomy Care Assessment and Management (repouching, etc.) PROCESS - Coordination of Care []  - 0 Simple Patient / Family Education for ongoing care X- 1 20 Complex (extensive) Patient / Family Education for ongoing care X- 1 10 Staff obtains ChiropractorConsents, Records, T Results / Process Orders est X- 1 10 Staff telephones HHA, Nursing Homes / Clarify orders / etc []  - 0 Routine Transfer to another Facility (non-emergent condition) []  - 0 Routine Hospital Admission (non-emergent condition) X- 1 15 New Admissions / Manufacturing engineernsurance Authorizations / Ordering NPWT Apligraf, etc. , []  - 0 Emergency Hospital Admission (emergent condition) PROCESS - Special Needs []  - 0 Pediatric / Minor Patient Management []  - 0 Isolation Patient Management []  - 0 Hearing / Language /  Visual special needs []  - 0 Assessment of Community assistance (transportation, D/C planning, etc.) []  - 0 Additional assistance / Altered mentation X- 1 15 Support Surface(s) Assessment (bed, cushion, seat, etc.) INTERVENTIONS - Miscellaneous []  - 0 External ear exam X- 1 10 Patient Transfer (multiple staff / Nurse, adultHoyer Lift / Similar devices) []  - 0 Simple  Staple / Suture removal (25 or less) []  - 0 Complex Staple / Suture removal (26 or more) []  - 0 Hypo/Hyperglycemic Management (do not check if billed separately) []  - 0 Ankle / Brachial Index (ABI) - do not check if billed separately Has the patient been seen at the hospital within the last three years: Yes Total Score: 125 Level Of Care: New/Established - Level 4 Electronic Signature(s) Signed: 11/10/2020 5:18:29 PM By: Entered By: on 11/10/2020 10:51:55 -------------------------------------------------------------------------------- Encounter Discharge Information Details Patient Name: Date of Service: Sarah Iba D. 11/10/2020 9:00 A M Medical Record Number: 01/10/2021 Patient Account Number: Sarah Hose Date of Birth/Sex: Treating RN: Sep 01, 1940 (80 y.o. [id number], [name] Primary Care Lanore Renderos: 13/07/1941 Other Clinician: Referring Azka Steger: Treating Gerry Heaphy/Extender: 96 in Treatment: 0 Encounter Discharge Information Items Post Procedure Vitals Discharge Condition: Stable Temperature (F): 97.4 Ambulatory Status: Stretcher Pulse (bpm): 74 Discharge Destination: Home Respiratory Rate (breaths/min): 17 Transportation: Ambulance Blood Pressure (mmHg): 147/74 Accompanied By: family Schedule Follow-up Appointment: Yes Clinical Summary of Care: Patient Declined Electronic Signature(s) Signed: 11/10/2020 5:01:26 PM By: [name] RN Entered By: Burnetta Sabin on 11/10/2020  11:05:54 -------------------------------------------------------------------------------- Lower Extremity Assessment Details Patient Name: Date of Service: Fonnie Mu D. 11/10/2020 9:00 A M Medical Record Number: 01/10/2021 Patient Account Number: Sarah Hose Date of Birth/Sex: Treating RN: [birth year] (80 y.o. [id number], [name] Primary Care Brylan Seubert: 13/07/1941 Other Clinician: Referring Euclid Cassetta: Treating Jeana Kersting/Extender: 96 in Treatment: 0 Electronic Signature(s) Signed: 11/10/2020 5:01:26 PM By: [name] RN Entered By: Burnetta Sabin on 11/10/2020 09:23:47 -------------------------------------------------------------------------------- Multi Wound Chart Details Patient Name: Date of Service: Fonnie Mu D. 11/10/2020 9:00 A M Medical Record Number: 01/10/2021 Patient Account Number: Sarah Hose Date of Birth/Sex: Treating RN: 1941-04-17 (80 y.o. [id number] Primary Care Maleeha Halls: 13/07/1941 Other Clinician: Referring Jaishawn Witzke: Treating Ezriel Boffa/Extender: 96 in Treatment: 0 Vital Signs Height(in): Pulse(bpm): 107 Weight(lbs): Blood Pressure(mmHg): 139/86 Body Mass Index(BMI): Temperature(F): 97.4 Respiratory Rate(breaths/min): 17 Photos: [3:No Photos Sacrum] [4:No Photos Left, Proximal Gluteus] [5:No Photos Left Gluteus] Wound Location: [3:Gradually Appeared] [4:Gradually Appeared] [5:Gradually Appeared] Wounding Event: [3:Pressure Ulcer] [4:Pressure Ulcer] [5:Pressure Ulcer] Primary Etiology: [3:Hypertension, Dementia] [4:Hypertension, Dementia] [5:Hypertension, Dementia] Comorbid History: [3:09/12/2020] [4:09/12/2020] [5:09/12/2020] Date Acquired: [3:0] [4:0] [5:0] Weeks of Treatment: [3:Open] [4:Open] [5:Open] Wound Status: [3:4x2x0.5] [4:0.8x0.7x0.1] [5:1.2x2.5x1] Measurements L x W x D (cm) [3:6.283] [4:0.44] [5:2.356] A (cm) : rea [3:3.142] [4:0.044]  [5:2.356] Volume (cm) : [3:0.00%] [4:N/A] [5:N/A] % Reduction in A rea: [3:0.00%] [4:N/A] [5:N/A] % Reduction in Volume: [3:12] Starting Position 1 (o'clock): [3:5] Ending Position 1 (o'clock): [3:2] Maximum Distance 1 (cm): [3:Yes] [4:No] [5:No] Undermining: [3:Category/Stage III] [4:Category/Stage II] [5:Category/Stage II] Classification: [3:Medium] [4:Medium] [5:Medium] Exudate A mount: [3:Serosanguineous] [4:Serosanguineous] [5:Serosanguineous] Exudate Type: [3:red, brown] [4:red, brown] [5:red, brown] Exudate Color: [3:Distinct, outline attached] [4:Distinct, outline attached] [5:Distinct, outline attached] Wound Margin: [3:Large (67-100%)] [4:Large (67-100%)] [5:Large (67-100%)] Granulation A mount: [3:Red, Pink] [4:Red, Pink] [5:Red, Pink] Granulation Quality: [3:Small (1-33%)] [4:Small (1-33%)] [5:Small (1-33%)] Necrotic A mount: [3:Fat Layer (Subcutaneous Tissue): Yes Fascia: No] [5:Fascia: No] Exposed Structures: [3:Fascia: No Tendon: No Muscle: No Joint: No Bone: No None] [4:Fat Layer (Subcutaneous Tissue): No Tendon: No Muscle: No Joint: No Bone: No None] [5:Fat Layer (Subcutaneous Tissue): No Tendon: No Muscle: No Joint: No Bone: No None] Epithelialization: [3:Debridement - Excisional] [4:N/A] [5:N/A] Debridement: Pre-procedure Verification/Time Out 10:09 [4:N/A] [5:N/A] Taken: [3:Other] [4:N/A] [5:N/A] Pain Control: [3:Subcutaneous, Slough] [4:N/A] [5:N/A] Tissue Debrided: [3:Skin/Subcutaneous Tissue] [4:N/A] [  5:N/A] Level: [3:8] [4:N/A] [5:N/A] Debridement A (sq cm): [3:rea Curette] [4:N/A] [5:N/A] Instrument: [3:Moderate] [4:N/A] [5:N/A] Bleeding: [3:Pressure] [4:N/A] [5:N/A] Hemostasis A chieved: [3:Procedure was tolerated well] [4:N/A] [5:N/A] Debridement Treatment Response: [3:4x2x0.5] [4:N/A] [5:N/A] Post Debridement Measurements L x W x D (cm) [3:3.142] [4:N/A] [5:N/A] Post Debridement Volume: (cm) [3:Category/Stage II] [4:N/A] [5:N/A] Post Debridement Stage:  [3:Debridement] [4:N/A] [5:N/A] Wound Number: 6 N/A N/A Photos: No Photos N/A N/A Midline Back N/A N/A Wound Location: Surgical Injury N/A N/A Wounding Event: Pressure Ulcer N/A N/A Primary Etiology: Hypertension, Dementia N/A N/A Comorbid History: 09/12/2020 N/A N/A Date Acquired: 0 N/A N/A Weeks of Treatment: Open N/A N/A Wound Status: 4.5x5x1 N/A N/A Measurements L x W x D (cm) 17.671 N/A N/A A (cm) : rea 17.671 N/A N/A Volume (cm) : N/A N/A N/A % Reduction in A rea: N/A N/A N/A % Reduction in Volume: 12 Starting Position 1 (o'clock): 12 Ending Position 1 (o'clock): 5.7 Maximum Distance 1 (cm): Yes N/A N/A Undermining: Category/Stage III N/A N/A Classification: Large N/A N/A Exudate A mount: Serosanguineous N/A N/A Exudate Type: red, brown N/A N/A Exudate Color: Distinct, outline attached N/A N/A Wound Margin: Medium (34-66%) N/A N/A Granulation A mount: Red, Pink N/A N/A Granulation Quality: Medium (34-66%) N/A N/A Necrotic A mount: Fat Layer (Subcutaneous Tissue): Yes N/A N/A Exposed Structures: Fascia: No Tendon: No Muscle: No Joint: No Bone: No N/A N/A N/A Epithelialization: Debridement - Excisional N/A N/A Debridement: 10:09 N/A N/A Pre-procedure Verification/Time Out Taken: Other N/A N/A Pain Control: Subcutaneous, Slough N/A N/A Tissue Debrided: Skin/Subcutaneous Tissue N/A N/A Level: 22.5 N/A N/A Debridement A (sq cm): rea Curette N/A N/A Instrument: Moderate N/A N/A Bleeding: Pressure N/A N/A Hemostasis A chieved: Procedure was tolerated well N/A N/A Debridement Treatment Response: 4.5x5x1 N/A N/A Post Debridement Measurements L x W x D (cm) 17.671 N/A N/A Post Debridement Volume: (cm) Category/Stage III N/A N/A Post Debridement Stage: Debridement N/A N/A Procedures Performed: Treatment Notes Electronic Signature(s) Signed: 11/10/2020 5:01:47 PM By: Baltazar Najjar MD Signed: 11/10/2020 5:18:29 PM By: Sarah Iba Entered By: Baltazar Najjar on 11/10/2020 10:42:54 -------------------------------------------------------------------------------- Multi-Disciplinary Care Plan Details Patient Name: Date of Service: Sarah Hose D. 11/10/2020 9:00 A M Medical Record Number: [id number] Patient Account Number: [id number] Date of Birth/Sex: Treating RN: 08-08-1941 (80 y.o. [name] Primary Care Zahniya Zellars: [name] Other Clinician: Referring Nakeia Calvi: Treating Normon Pettijohn/Extender: Burnetta Sabin in Treatment: 0 Active Inactive Pressure Nursing Diagnoses: Knowledge deficit related to causes and risk factors for pressure ulcer development Goals: Patient will remain free from development of additional pressure ulcers Date Initiated: 11/10/2020 Target Resolution Date: 12/15/2020 Goal Status: Active Interventions: Assess: immobility, friction, shearing, incontinence upon admission and as needed Assess offloading mechanisms upon admission and as needed Assess potential for pressure ulcer upon admission and as needed Provide education on pressure ulcers Treatment Activities: Patient referred for home evaluation of offloading devices/mattresses : 11/10/2020 Notes: Wound/Skin Impairment Nursing Diagnoses: Impaired tissue integrity Goals: Patient/caregiver will verbalize understanding of skin care regimen Date Initiated: 11/10/2020 Target Resolution Date: 12/08/2020 Goal Status: Active Ulcer/skin breakdown will have a volume reduction of 30% by week 4 Date Initiated: 11/10/2020 Target Resolution Date: 12/08/2020 Goal Status: Active Interventions: Assess patient/caregiver ability to obtain necessary supplies Assess patient/caregiver ability to perform ulcer/skin care regimen upon admission and as needed Assess ulceration(s) every visit Provide education on ulcer and skin care Treatment Activities: Skin care regimen initiated : 11/10/2020 Topical wound management  initiated : 11/10/2020 Notes: Electronic Signature(s) Signed: 11/10/2020  9:07:50 AM By: Sarah Iba Entered By: Sarah Iba on 11/10/2020 09:07:50 -------------------------------------------------------------------------------- Pain Assessment Details Patient Name: Date of Service: SHATERIA, PATERNOSTRO 11/10/2020 9:00 A M Medical Record Number: [id number] Patient Account Number: [id number] Date of Birth/Sex: Treating RN: 06-18-1941 (80 y.o. Ardis Rowan, [name] Primary Care Yomayra Tate: [name] Other Clinician: Referring Livy Ross: Treating Romy Mcgue/Extender: Burnetta Sabin in Treatment: 0 Active Problems Location of Pain Severity and Description of Pain Patient Has Paino No Site Locations Pain Management and Medication Current Pain Management: Electronic Signature(s) Signed: 11/10/2020 5:01:26 PM By: Fonnie Mu RN Entered By: Fonnie Mu on 11/10/2020 09:23:58 -------------------------------------------------------------------------------- Patient/Caregiver Education Details Patient Name: Date of Service: [name] 4/1/2022andnbsp9:00 A M Medical Record Number: [id number] Patient Account Number: [id number] Date of Birth/Gender: Treating RN: [birth year] (80 y.o. [name] Primary Care Physician: [name] Other Clinician: Referring Physician: Treating Physician/Extender: Burnetta Sabin in Treatment: 0 Education Assessment Education Provided To: Patient and Caregiver Education Topics Provided Pressure: Handouts: Pressure Ulcers: Care and Offloading, Pressure Ulcers: Care and Offloading 2 Methods: Explain/Verbal, Printed Responses: State content correctly Welcome [facility]: o Handouts: Welcome T [facility] o Methods: Explain/Verbal, Printed Responses: State content correctly Wound/Skin Impairment: Methods: Explain/Verbal, Printed Responses: State content  correctly Electronic Signature(s) Signed: 11/10/2020 5:18:29 PM By: Sarah Iba Entered By: Sarah Iba on 11/10/2020 09:09:37 -------------------------------------------------------------------------------- Wound Assessment Details Patient Name: Date of Service: Sarah Hose D. 11/10/2020 9:00 A M Medical Record Number: [id number] Patient Account Number: [id number] Date of Birth/Sex: Treating RN: 1941/03/16 (80 y.o. Ardis Rowan, [name] Primary Care Valisa Karpel: [name] Other Clinician: Referring Samarion Ehle: Treating Shaquera Ansley/Extender: Burnetta Sabin in Treatment: 0 Wound Status Wound Number: 3 Primary Etiology: Pressure Ulcer Wound Location: Sacrum Wound Status: Open Wounding Event: Gradually Appeared Comorbid History: Hypertension, Dementia Date Acquired: 09/12/2020 Weeks Of Treatment: 0 Clustered Wound: No Photos Wound Measurements Length: (cm) 4 Width: (cm) 2 Depth: (cm) 0.5 Area: (cm) 6.283 Volume: (cm) 3.142 % Reduction in Area: 0% % Reduction in Volume: 0% Epithelialization: None Tunneling: No Undermining: Yes Starting Position (o'clock): 12 Ending Position (o'clock): 5 Maximum Distance: (cm) 2 Wound Description Classification: Category/Stage III Wound Margin: Distinct, outline attached Exudate Amount: Medium Exudate Type: Serosanguineous Exudate Color: red, brown Foul Odor After Cleansing: No Slough/Fibrino Yes Wound Bed Granulation Amount: Large (67-100%) Exposed Structure Granulation Quality: Red, Pink Fascia Exposed: No Necrotic Amount: Small (1-33%) Fat Layer (Subcutaneous Tissue) Exposed: Yes Necrotic Quality: Adherent Slough Tendon Exposed: No Muscle Exposed: No Joint Exposed: No Bone Exposed: No Treatment Notes Wound #3 (Sacrum) Cleanser Soap and Water Discharge Instruction: May shower and wash wound with dial antibacterial soap and water prior to dressing change. Peri-Wound Care Topical Primary  Dressing Promogran Prisma Matrix, 4.34 (sq in) (silver collagen) Discharge Instruction: Moisten collagen with saline or hydrogel Secondary Dressing Woven Gauze Sponge, Non-Sterile 4x4 in Discharge Instruction: Apply saline moist over primary then dry gauze ComfortFoam Border, 4x4 in (silicone border) Discharge Instruction: Apply over primary dressing as directed. Secured With Compression Wrap Compression Stockings Facilities manager) Signed: 11/10/2020 5:01:26 PM By: Fonnie Mu RN Signed: 11/13/2020 10:23:55 AM By: Karl Ito Entered By: Karl Ito on 11/10/2020 16:41:13 -------------------------------------------------------------------------------- Wound Assessment Details Patient Name: Date of Service: Sarah Hose D. 11/10/2020 9:00 A M Medical Record Number: [id number] Patient Account Number: [id number] Date of Birth/Sex: Treating RN: 01-Aug-1941 (80 y.o. Ardis Rowan, [name] Primary Care Elenora Hawbaker: [name] Other Clinician: Referring Arval Brandstetter: Treating Calley Drenning/Extender: Felipa Furnace  Weeks in Treatment: 0 Wound Status Wound Number: 4 Primary Etiology: Pressure Ulcer Wound Location: Left, Proximal Gluteus Wound Status: Open Wounding Event: Gradually Appeared Comorbid History: Hypertension, Dementia Date Acquired: 09/12/2020 Weeks Of Treatment: 0 Clustered Wound: No Photos Wound Measurements Length: (cm) 0.8 Width: (cm) 0.7 Depth: (cm) 0.1 Area: (cm) 0.44 Volume: (cm) 0.044 % Reduction in Area: 0% % Reduction in Volume: 0% Epithelialization: None Tunneling: No Undermining: No Wound Description Classification: Category/Stage II Wound Margin: Distinct, outline attached Exudate Amount: Medium Exudate Type: Serosanguineous Exudate Color: red, brown Foul Odor After Cleansing: No Slough/Fibrino Yes Wound Bed Granulation Amount: Large (67-100%) Exposed Structure Granulation Quality: Red, Pink Fascia Exposed:  No Necrotic Amount: Small (1-33%) Fat Layer (Subcutaneous Tissue) Exposed: No Necrotic Quality: Adherent Slough Tendon Exposed: No Muscle Exposed: No Joint Exposed: No Bone Exposed: No Treatment Notes Wound #4 (Gluteus) Wound Laterality: Left, Proximal Cleanser Soap and Water Discharge Instruction: May shower and wash wound with dial antibacterial soap and water prior to dressing change. Peri-Wound Care Topical Primary Dressing Promogran Prisma Matrix, 4.34 (sq in) (silver collagen) Discharge Instruction: Moisten collagen with saline or hydrogel Secondary Dressing Woven Gauze Sponge, Non-Sterile 4x4 in Discharge Instruction: Apply saline moist over primary then dry gauze ComfortFoam Border, 4x4 in (silicone border) Discharge Instruction: Apply over primary dressing as directed. Secured With Compression Wrap Compression Stockings Facilities manager) Signed: 11/10/2020 5:01:26 PM By: Fonnie Mu RN Signed: 11/13/2020 10:23:55 AM By: Karl Ito Entered By: Karl Ito on 11/10/2020 16:41:32 -------------------------------------------------------------------------------- Wound Assessment Details Patient Name: Date of Service: Sarah Hose D. 11/10/2020 9:00 A M Medical Record Number: [id number] Patient Account Number: [id number] Date of Birth/Sex: Treating RN: [birth year] (80 y.o. Ardis Rowan, [name] Primary Care Armandina Iman: [name] Other Clinician: Referring Whitfield Dulay: Treating Puneet Selden/Extender: Burnetta Sabin in Treatment: 0 Wound Status Wound Number: 5 Primary Etiology: Pressure Ulcer Wound Location: Left Gluteus Wound Status: Open Wounding Event: Gradually Appeared Comorbid History: Hypertension, Dementia Date Acquired: 09/12/2020 Weeks Of Treatment: 0 Clustered Wound: No Photos Wound Measurements Length: (cm) 1.2 Width: (cm) 2.5 Depth: (cm) 1 Area: (cm) 2.356 Volume: (cm) 2.356 % Reduction in Area:  0% % Reduction in Volume: 0% Epithelialization: None Tunneling: No Undermining: No Wound Description Classification: Category/Stage II Wound Margin: Distinct, outline attached Exudate Amount: Medium Exudate Type: Serosanguineous Exudate Color: red, brown Foul Odor After Cleansing: No Slough/Fibrino Yes Wound Bed Granulation Amount: Large (67-100%) Exposed Structure Granulation Quality: Red, Pink Fascia Exposed: No Necrotic Amount: Small (1-33%) Fat Layer (Subcutaneous Tissue) Exposed: No Necrotic Quality: Adherent Slough Tendon Exposed: No Muscle Exposed: No Joint Exposed: No Bone Exposed: No Treatment Notes Wound #5 (Gluteus) Wound Laterality: Left Cleanser Soap and Water Discharge Instruction: May shower and wash wound with dial antibacterial soap and water prior to dressing change. Peri-Wound Care Topical Primary Dressing Promogran Prisma Matrix, 4.34 (sq in) (silver collagen) Discharge Instruction: Moisten collagen with saline or hydrogel Secondary Dressing Woven Gauze Sponge, Non-Sterile 4x4 in Discharge Instruction: Apply saline moist over primary then dry gauze ComfortFoam Border, 4x4 in (silicone border) Discharge Instruction: Apply over primary dressing as directed. Secured With Compression Wrap Compression Stockings Facilities manager) Signed: 11/10/2020 5:01:26 PM By: Fonnie Mu RN Signed: 11/13/2020 10:23:55 AM By: Karl Ito Entered By: Karl Ito on 11/10/2020 16:41:55 -------------------------------------------------------------------------------- Wound Assessment Details Patient Name: Date of Service: Sarah Hose D. 11/10/2020 9:00 A M Medical Record Number: [id number] Patient Account Number: [id number] Date of Birth/Sex: Treating RN: 06/04/1941 (80 y.o. Ardis Rowan, [name] Primary Care Jeffrey Voth: [name]  Other Clinician: Referring Neiman Roots: Treating Colbey Wirtanen/Extender: Burnetta Sabin  in Treatment: 0 Wound Status Wound Number: 6 Primary Etiology: Pressure Ulcer Wound Location: Midline Back Wound Status: Open Wounding Event: Surgical Injury Comorbid History: Hypertension, Dementia Date Acquired: 09/12/2020 Weeks Of Treatment: 0 Clustered Wound: No Photos Wound Measurements Length: (cm) 4.5 Width: (cm) 5 Depth: (cm) 1 Area: (cm) 17.671 Volume: (cm) 17.671 % Reduction in Area: 0% % Reduction in Volume: 0% Tunneling: No Undermining: Yes Starting Position (o'clock): 12 Ending Position (o'clock): 12 Maximum Distance: (cm) 5.7 Wound Description Classification: Category/Stage III Wound Margin: Distinct, outline attached Exudate Amount: Large Exudate Type: Serosanguineous Exudate Color: red, brown Foul Odor After Cleansing: No Slough/Fibrino Yes Wound Bed Granulation Amount: Medium (34-66%) Exposed Structure Granulation Quality: Red, Pink Fascia Exposed: No Necrotic Amount: Medium (34-66%) Fat Layer (Subcutaneous Tissue) Exposed: Yes Necrotic Quality: Adherent Slough Tendon Exposed: No Muscle Exposed: No Joint Exposed: No Bone Exposed: No Treatment Notes Wound #6 (Back) Wound Laterality: Midline Cleanser Soap and Water Discharge Instruction: May shower and wash wound with dial antibacterial soap and water prior to dressing change. Peri-Wound Care Topical Primary Dressing Promogran Prisma Matrix, 4.34 (sq in) (silver collagen) Discharge Instruction: Moisten collagen with saline or hydrogel Secondary Dressing Woven Gauze Sponge, Non-Sterile 4x4 in Discharge Instruction: Apply saline moist over primary then dry gauze ComfortFoam Border, 6x6 in (silicone border) Discharge Instruction: Apply over primary dressing as directed. Secured With Compression Wrap Compression Stockings Facilities manager) Signed: 11/10/2020 5:01:26 PM By: Fonnie Mu RN Signed: 11/13/2020 10:23:55 AM By: Karl Ito Entered By: Karl Ito on  11/10/2020 16:42:15 -------------------------------------------------------------------------------- Vitals Details Patient Name: Date of Service: Sarah Hose D. 11/10/2020 9:00 A M Medical Record Number: [id number] Patient Account Number: [id number] Date of Birth/Sex: Treating RN: 21-Mar-1941 (80 y.o. Ardis Rowan, [name] Primary Care Judaea Burgoon: [name] Other Clinician: Referring Modine Oppenheimer: Treating Trany Chernick/Extender: Burnetta Sabin in Treatment: 0 Vital Signs Time Taken: 09:20 Temperature (F): 97.4 Pulse (bpm): 107 Respiratory Rate (breaths/min): 17 Blood Pressure (mmHg): 139/86 Reference Range: 80 - 120 mg / dl Electronic Signature(s) Signed: 11/10/2020 5:01:26 PM By: Fonnie Mu RN Entered By: Fonnie Mu on 11/10/2020 09:20:06

## 2020-11-24 ENCOUNTER — Encounter (HOSPITAL_BASED_OUTPATIENT_CLINIC_OR_DEPARTMENT_OTHER): Payer: Medicare Other | Admitting: Internal Medicine

## 2020-11-24 ENCOUNTER — Other Ambulatory Visit: Payer: Self-pay

## 2020-11-24 DIAGNOSIS — L89323 Pressure ulcer of left buttock, stage 3: Secondary | ICD-10-CM | POA: Diagnosis not present

## 2020-11-24 NOTE — Progress Notes (Signed)
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 Soap and Water [facility](Home Health) Every Other Day/30 Days Discharge Instructions: May shower and wash wound with dial antibacterial soap and water prior to dressing change. Prim Dressing: Promogran Prisma Matrix, 4.34 (sq in) (silver collagen) (Home Health) Every Other Day/30 Days ary Discharge Instructions: Moisten with saline or hydrogel-pack into undermining Secondary Dressing: Woven Gauze Sponge, Non-Sterile 4x4 in [facility](Home Health) Every Other Day/30 Days Discharge Instructions: Apply saline moist over primary then dry gauze Secondary Dressing: ComfortFoam Border, 4x4 in (silicone border) [facility](Home Health) Every Other Day/30 Days Discharge Instructions: Apply over primary dressing as directed. WOUND #6: - Back Wound Laterality: Midline Cleanser: Soap and  Water [facility](Home Health) Every Other Day/30 Days Discharge Instructions: May shower and wash wound with dial antibacterial soap and water prior to dressing change. Prim Dressing: Promogran Prisma Matrix, 4.34 (sq in) (silver collagen) (Home Health) Every Other Day/30 Days ary Discharge Instructions: Moisten collagen with saline or hydrogel Secondary Dressing: Woven Gauze Sponge, Non-Sterile 4x4 in [facility](Home Health) Every Other Day/30 Days Discharge Instructions: Apply saline moist over primary then dry gauze Secondary Dressing: ComfortFoam Border, 6x6 in (silicone border) [facility](Home Health) Every Other Day/30 Days Discharge Instructions: Apply over primary dressing as directed. 1. 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 Recent admission to hospital. She is completing her ampicillin and ciprofloxacin 2. She may require additional imaging studies lab work. 3. I will try to look through and see what infectious disease wanted to do in follow-up here. 4. Her family is being religious about offloading this. She is PEG tube dependent Addendum when she was hospital in January she was discharged to skilled facility 6 weeks of Ancef. During her readmission in March that is where she came up on the oral amoxicillin and ciprofloxacin. Thankfully it seems that her inflammatory markers in March were down sizably at 29 for the sedimentation rate and 1.4 for the C-reactive protein. Previously on 07/24/2020 these were 90 and 3.7 respectively. Electronic Signature(s) Signed: 11/24/2020 4:57:27 PM By: Baltazar Najjarobson, Kacen Mellinger MD Entered By: Baltazar Najjarobson, Daequan Kozma on 11/24/2020 09:35:21 -------------------------------------------------------------------------------- SuperBill Details Patient Name: Date of Service: Sarah HoseMCNEIL, Mackensi D. 11/24/2020 Medical Record Number: [id number] Patient Account Number: [id number] Date of  Birth/Sex: Treating RN: 1940/10/15 (80 y.o. Sarah CluckF) Barnhart, Jodi Primary Care Provider: Fleet ContrasAvbuere, Edwin Other Clinician: Referring Provider: Treating Provider/Extender: Burnetta Sabinobson, Joanna Borawski Avbuere, Edwin Weeks in Treatment: 2 Diagnosis Coding ICD-10 Codes Code Description L89.103 Pressure ulcer of unspecified part of back, stage 3 L89.153 Pressure ulcer of sacral region, stage 3 L89.323 Pressure ulcer of left buttock, stage 3 T81.31XA Disruption of external operation (surgical) wound, not elsewhere classified, initial encounter Facility Procedures CPT4 Code: [id number] Description: 11042 - DEB SUBQ TISSUE 20 SQ CM/< ICD-10 Diagnosis Description 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

## 2020-11-27 ENCOUNTER — Telehealth: Payer: Self-pay

## 2020-11-27 NOTE — Telephone Encounter (Signed)
Phone call placed to patient's son to check in on patient. Son shared that patient is going to wound center Q 2 weeks and has home health involved. Son ok with PC coordinating time with [facility] to make visit.

## 2020-11-29 NOTE — Progress Notes (Signed)
[name], [name] ([id number]) Visit Report for 11/24/2020 Arrival Information Details Patient Name: Date of Service: Sarah, [name] 11/24/2020 8:45 A M Medical Record Number: [id number] Patient Account Number: [id number] Date of Birth/Sex: Treating RN: [birth year] (80 y.o. [name] Primary Care Joee Iovine: [name] Other Clinician: Referring Dotti Busey: Treating Yarelie Hams/Extender: Burnetta Sabin in Treatment: 2 Visit Information History Since Last Visit Added or deleted any medications: No Patient Arrived: Stretcher Any new allergies or adverse reactions: No Arrival Time: 08:38 Had a fall or experienced change in No Accompanied By: self activities of daily living that may affect Transfer Assistance: None risk of falls: Patient Identification Verified: Yes Signs or symptoms of abuse/neglect since last visito No Secondary Verification Process Completed: Yes Hospitalized since last visit: No Patient Requires Transmission-Based Precautions: No Implantable device outside of the clinic excluding No cellular tissue based products placed in the center since last visit: Has Dressing in Place as Prescribed: Yes Pain Present Now: Yes Electronic Signature(s) Signed: 11/28/2020 8:07:32 AM By: Karl Ito Entered By: Karl Ito on 11/24/2020 08:39:08 -------------------------------------------------------------------------------- Encounter Discharge Information Details Patient Name: Date of Service: Sarah Hose D. 11/24/2020 8:45 A M Medical Record Number: [id number] Patient Account Number: [id number] Date of Birth/Sex: Treating RN: 12/05/1940 (80 y.o. [name] Primary Care Lutricia Widjaja: [name] Other Clinician: Referring Latausha Flamm: Treating Jiah Bari/Extender: Burnetta Sabin in Treatment: 2 Encounter Discharge Information Items Post Procedure Vitals Discharge Condition: Stable Temperature (F):  98.3 Ambulatory Status: Stretcher Pulse (bpm): 84 Discharge Destination: Home Respiratory Rate (breaths/min): 17 Transportation: Ambulance Blood Pressure (mmHg): 119/69 Accompanied By: son and EMS crew Schedule Follow-up Appointment: Yes Clinical Summary of Care: Electronic Signature(s) Signed: 11/24/2020 5:54:45 PM By: Shawn Stall Entered By: Shawn Stall on 11/24/2020 11:08:06 -------------------------------------------------------------------------------- Lower Extremity Assessment Details Patient Name: Date of Service: [name], [name] 11/24/2020 8:45 A M Medical Record Number: [id number] Patient Account Number: [id number] Date of Birth/Sex: Treating RN: 1941-04-09 (80 y.o. [name] Primary Care Kahlil Cowans: [name] Other Clinician: Referring Ricquel Foulk: Treating Rozanna Cormany/Extender: Burnetta Sabin in Treatment: 2 Electronic Signature(s) Signed: 11/29/2020 6:20:01 PM By: Zenaida Deed RN, BSN Entered By: Zenaida Deed on 11/24/2020 08:53:59 -------------------------------------------------------------------------------- Multi Wound Chart Details Patient Name: Date of Service: Sarah Hose D. 11/24/2020 8:45 A M Medical Record Number: [id number] Patient Account Number: [id number] Date of Birth/Sex: Treating RN: [birth year] (80 y.o. [name] Primary Care [name]: [name] Other Clinician: Referring Elenore Wanninger: Treating Apolonia Ellwood/Extender: Burnetta Sabin in Treatment: 2 Vital Signs Height(in): Pulse(bpm): 84 Weight(lbs): Blood Pressure(mmHg): 119/69 Body Mass Index(BMI): Temperature(F): 98.3 Respiratory Rate(breaths/min): 17 Photos: [3:No Photos Sacrum] [4:No Photos Left, Proximal Gluteus] [5:No Photos Left Gluteus] Wound Location: [3:Gradually Appeared] [4:Gradually Appeared] [5:Gradually Appeared] Wounding Event: [3:Pressure Ulcer] [4:Pressure Ulcer] [5:Pressure Ulcer] Primary  Etiology: [3:Hypertension, Dementia] [4:Hypertension, Dementia] [5:Hypertension, Dementia] Comorbid History: [3:09/12/2020] [4:09/12/2020] [5:09/12/2020] Date Acquired: [3:2] [4:2] [5:2] Weeks of Treatment: [3:Open] [4:Open] [5:Open] Wound Status: [3:4x2x0.3] [4:0.7x0.4x0.1] [5:1x2.5x0.8] Measurements L x W x D (cm) [3:6.283] [4:0.22] [5:1.963] A (cm) : rea [3:1.885] [4:0.022] [5:1.571] Volume (cm) : [3:0.00%] [4:50.00%] [5:16.70%] % Reduction in A rea: [3:40.00%] [4:50.00%] [5:33.30%] % Reduction in Volume: [5:12] Starting Position 1 (o'clock): [5:4] Ending Position 1 (o'clock): [5:7.3] Maximum Distance 1 (cm): [3:No] [4:No] [5:Yes] Undermining: [3:Category/Stage III] [4:Category/Stage II] [5:Category/Stage II] Classification: [3:Medium] [4:Small] [5:Medium] Exudate A mount: [3:Purulent] [4:Serosanguineous] [5:Purulent] Exudate Type: [3:yellow, brown, green] [4:red, brown] [5:yellow, brown, green] Exudate Color: [3:Flat and Intact] [4:Flat and Intact] [5:Well defined, not attached] Wound Margin: [  3:Small (1-33%)] [4:Large (67-100%)] [5:Large (67-100%)] Granulation A mount: [3:Red, Pink] [4:Red] [5:Red] Granulation Quality: [3:Large (67-100%)] [4:None Present (0%)] [5:None Present (0%)] Necrotic A mount: [3:Fat Layer (Subcutaneous Tissue): Yes Fat Layer (Subcutaneous Tissue): Yes Fat Layer (Subcutaneous Tissue): Yes] Exposed Structures: [3:Fascia: No Tendon: No Muscle: No Joint: No Bone: No None] [4:Fascia: No Tendon: No Muscle: No Joint: No Bone: No Small (1-33%)] [5:Fascia: No Tendon: No Muscle: No Joint: No Bone: No None] Epithelialization: [3:Debridement - Excisional] [4:N/A] [5:N/A] Debridement: [3:09:05] [4:N/A] [5:N/A] Pre-procedure Verification/Time Out Taken: [3:Other] [4:N/A] [5:N/A] Pain Control: [3:Subcutaneous, Slough] [4:N/A] [5:N/A] Tissue Debrided: [3:Skin/Subcutaneous Tissue] [4:N/A] [5:N/A] Level: [3:8] [4:N/A] [5:N/A] Debridement A (sq cm): [3:rea Curette] [4:N/A]  [5:N/A] Instrument: [3:Minimum] [4:N/A] [5:N/A] Bleeding: [3:Pressure] [4:N/A] [5:N/A] Hemostasis A chieved: [3:Procedure was tolerated well] [4:N/A] [5:N/A] Debridement Treatment Response: [3:4x2x0.3] [4:N/A] [5:N/A] Post Debridement Measurements L x W x D (cm) [3:1.885] [4:N/A] [5:N/A] Post Debridement Volume: (cm) [3:Category/Stage III] [4:N/A] [5:N/A] Post Debridement Stage: [3:Debridement] [4:N/A] [5:N/A] Wound Number: 6 N/A N/A Photos: No Photos N/A N/A Midline Back N/A N/A Wound Location: Surgical Injury N/A N/A Wounding Event: Pressure Ulcer N/A N/A Primary Etiology: Hypertension, Dementia N/A N/A Comorbid History: 09/12/2020 N/A N/A Date Acquired: 2 N/A N/A Weeks of Treatment: Open N/A N/A Wound Status: 5x5x0.7 N/A N/A Measurements L x W x D (cm) 19.635 N/A N/A A (cm) : rea 13.744 N/A N/A Volume (cm) : -11.10% N/A N/A % Reduction in A rea: 22.20% N/A N/A % Reduction in Volume: 11 Starting Position 1 (o'clock): 6 Ending Position 1 (o'clock): 3.7 Maximum Distance 1 (cm): Yes N/A N/A Undermining: Category/Stage III N/A N/A Classification: Medium N/A N/A Exudate A mount: Purulent N/A N/A Exudate Type: yellow, brown, green N/A N/A Exudate Color: Well defined, not attached N/A N/A Wound Margin: Large (67-100%) N/A N/A Granulation A mount: Red, Pink N/A N/A Granulation Quality: Small (1-33%) N/A N/A Necrotic A mount: Fat Layer (Subcutaneous Tissue): Yes N/A N/A Exposed Structures: Fascia: No Tendon: No Muscle: No Joint: No Bone: No None N/A N/A Epithelialization: N/A N/A N/A Debridement: N/A N/A N/A Pain Control: N/A N/A N/A Tissue Debrided: N/A N/A N/A Level: N/A N/A N/A Debridement A (sq cm): rea N/A N/A N/A Instrument: N/A N/A N/A Bleeding: N/A N/A N/A Hemostasis A chieved: Debridement Treatment Response: N/A N/A N/A Post Debridement Measurements L x N/A N/A N/A W x D (cm) N/A N/A N/A Post Debridement Volume: (cm) N/A  N/A N/A Post Debridement Stage: N/A N/A N/A Procedures Performed: Treatment Notes Electronic Signature(s) Signed: 11/24/2020 4:57:27 PM By: Baltazar Najjar MD Signed: 11/24/2020 5:21:53 PM By: Antonieta Iba Entered By: Baltazar Najjar on 11/24/2020 09:22:11 -------------------------------------------------------------------------------- Multi-Disciplinary Care Plan Details Patient Name: Date of Service: Sarah Hose D. 11/24/2020 8:45 A M Medical Record Number: [id number] Patient Account Number: [id number] Date of Birth/Sex: Treating RN: 31-Dec-1940 (80 y.o. [name] Primary Care Abhimanyu Cruces: [name] Other Clinician: Referring Nusaybah Ivie: Treating Kamaria Lucia/Extender: Burnetta Sabin in Treatment: 2 Active Inactive Pressure Nursing Diagnoses: Knowledge deficit related to causes and risk factors for pressure ulcer development Goals: Patient will remain free from development of additional pressure ulcers Date Initiated: 11/10/2020 Target Resolution Date: 12/15/2020 Goal Status: Active Interventions: Assess: immobility, friction, shearing, incontinence upon admission and as needed Assess offloading mechanisms upon admission and as needed Assess potential for pressure ulcer upon admission and as needed Provide education on pressure ulcers Treatment Activities: Patient referred for home evaluation of offloading devices/mattresses : 11/10/2020 Notes: Wound/Skin Impairment Nursing Diagnoses: Impaired tissue integrity Goals:  Patient/caregiver will verbalize understanding of skin care regimen Date Initiated: 11/10/2020 Target Resolution Date: 12/08/2020 Goal Status: Active Ulcer/skin breakdown will have a volume reduction of 30% by week 4 Date Initiated: 11/10/2020 Target Resolution Date: 12/08/2020 Goal Status: Active Interventions: Assess patient/caregiver ability to obtain necessary supplies Assess patient/caregiver ability to perform ulcer/skin care  regimen upon admission and as needed Assess ulceration(s) every visit Provide education on ulcer and skin care Treatment Activities: Skin care regimen initiated : 11/10/2020 Topical wound management initiated : 11/10/2020 Notes: Electronic Signature(s) Signed: 11/24/2020 8:45:46 AM By: Antonieta Iba Entered By: Antonieta Iba on 11/24/2020 08:45:45 -------------------------------------------------------------------------------- Pain Assessment Details Patient Name: Date of Service: Sarah Hose D. 11/24/2020 8:45 A M Medical Record Number: [id number] Patient Account Number: [id number] Date of Birth/Sex: Treating RN: [birth year] (80 y.o. [name] Primary Care Jahquez Steffler: [name] Other Clinician: Referring Myron Lona: Treating Torell Minder/Extender: Burnetta Sabin in Treatment: 2 Active Problems Location of Pain Severity and Description of Pain Patient Has Paino Yes Site Locations Pain Location: Pain in Ulcers With Dressing Change: Yes Duration of the Pain. Constant / Intermittento Intermittent Rate the pain. Current Pain Level: 10 Worst Pain Level: 10 Least Pain Level: 0 Character of Pain Describe the Pain: Aching Pain Management and Medication Current Pain Management: Medication: Yes Other: reposition Is the Current Pain Management Adequate: Adequate How does your wound impact your activities of daily livingo Sleep: No Bathing: No Appetite: No Relationship With Others: No Bladder Continence: No Emotions: No Bowel Continence: No Hobbies: No Toileting: No Dressing: No Electronic Signature(s) Signed: 11/24/2020 5:21:53 PM By: Antonieta Iba Signed: 11/29/2020 6:20:01 PM By: Zenaida Deed RN, BSN Entered By: Zenaida Deed on 11/24/2020 08:53:52 -------------------------------------------------------------------------------- Patient/Caregiver Education Details Patient Name: Date of Service: [name] 4/15/2022andnbsp8:45  A M Medical Record Number: [id number] Patient Account Number: [id number] Date of Birth/Gender: Treating RN: 09-Nov-1940 (80 y.o. [name] Primary Care Physician: [name] Other Clinician: Referring Physician: Treating Physician/Extender: Burnetta Sabin in Treatment: 2 Education Assessment Education Provided To: Patient and Caregiver Education Topics Provided Pressure: Methods: Explain/Verbal, Printed Responses: State content correctly Wound/Skin Impairment: Methods: Demonstration, Explain/Verbal, Printed Responses: State content correctly Electronic Signature(s) Signed: 11/24/2020 5:21:53 PM By: Antonieta Iba Entered By: Antonieta Iba on 11/24/2020 08:46:10 -------------------------------------------------------------------------------- Wound Assessment Details Patient Name: Date of Service: Sarah Hose D. 11/24/2020 8:45 A M Medical Record Number: [id number] Patient Account Number: [id number] Date of Birth/Sex: Treating RN: [birth year] (80 y.o. [name] Primary Care Mariza Bourget: [name] Other Clinician: Referring Krysten Veronica: Treating De Jaworski/Extender: Burnetta Sabin in Treatment: 2 Wound Status Wound Number: 3 Primary Etiology: Pressure Ulcer Wound Location: Sacrum Wound Status: Open Wounding Event: Gradually Appeared Comorbid History: Hypertension, Dementia Date Acquired: 09/12/2020 Weeks Of Treatment: 2 Clustered Wound: No Photos Wound Measurements Length: (cm) 4 Width: (cm) 2 Depth: (cm) 0.3 Area: (cm) 6.283 Volume: (cm) 1.885 % Reduction in Area: 0% % Reduction in Volume: 40% Epithelialization: None Tunneling: No Undermining: No Wound Description Classification: Category/Stage III Wound Margin: Flat and Intact Exudate Amount: Medium Exudate Type: Purulent Exudate Color: yellow, brown, green Foul Odor After Cleansing: No Slough/Fibrino Yes Wound Bed Granulation Amount:  Small (1-33%) Exposed Structure Granulation Quality: Red, Pink Fascia Exposed: No Necrotic Amount: Large (67-100%) Fat Layer (Subcutaneous Tissue) Exposed: Yes Necrotic Quality: Adherent Slough Tendon Exposed: No Muscle Exposed: No Joint Exposed: No Bone Exposed: No Treatment Notes Wound #3 (Sacrum) Cleanser Soap and Water Discharge Instruction: May shower and wash wound with dial antibacterial soap  and water prior to dressing change. Peri-Wound Care Topical Primary Dressing Promogran Prisma Matrix, 4.34 (sq in) (silver collagen) Discharge Instruction: Moisten with saline or hydrogel-pack into undermining Secondary Dressing Woven Gauze Sponge, Non-Sterile 4x4 in Discharge Instruction: Apply saline moist over primary then dry gauze ComfortFoam Border, 4x4 in (silicone border) Discharge Instruction: Apply over primary dressing as directed. Secured With Compression Wrap Compression Stockings Facilities managerAdd-Ons Electronic Signature(s) Signed: 11/24/2020 5:21:53 PM By: Antonieta IbaBarnhart, Jodi Signed: 11/28/2020 8:07:32 AM By: Karl Itoawkins, Destiny Entered By: Karl Itoawkins, Destiny on 11/24/2020 16:47:52 -------------------------------------------------------------------------------- Wound Assessment Details Patient Name: Date of Service: [name], Sarah D. 11/24/2020 8:45 A M Medical Record Number: [id number] Patient Account Number: [id number] Date of Birth/Sex: Treating RN: [birth year] (80 y.o. Sarah CluckF) Barnhart, Jodi Primary Care Luana Tatro: Fleet ContrasAvbuere, Edwin Other Clinician: Referring Nikyla Navedo: Treating Cordai Rodrigue/Extender: Burnetta Sabinobson, Michael Avbuere, Edwin Weeks in Treatment: 2 Wound Status Wound Number: 4 Primary Etiology: Pressure Ulcer Wound Location: Left, Proximal Gluteus Wound Status: Open Wounding Event: Gradually Appeared Comorbid History: Hypertension, Dementia Date Acquired: 09/12/2020 Weeks Of Treatment: 2 Clustered Wound: No Photos Wound Measurements Length: (cm) 0.7 Width: (cm) 0.4 Depth: (cm)  0.1 Area: (cm) 0.22 Volume: (cm) 0.022 % Reduction in Area: 50% % Reduction in Volume: 50% Epithelialization: Small (1-33%) Tunneling: No Undermining: No Wound Description Classification: Category/Stage II Wound Margin: Flat and Intact Exudate Amount: Small Exudate Type: Serosanguineous Exudate Color: red, brown Foul Odor After Cleansing: No Slough/Fibrino No Wound Bed Granulation Amount: Large (67-100%) Exposed Structure Granulation Quality: Red Fascia Exposed: No Necrotic Amount: None Present (0%) Fat Layer (Subcutaneous Tissue) Exposed: Yes Tendon Exposed: No Muscle Exposed: No Joint Exposed: No Bone Exposed: No Treatment Notes Wound #4 (Gluteus) Wound Laterality: Left, Proximal Cleanser Soap and Water Discharge Instruction: May shower and wash wound with dial antibacterial soap and water prior to dressing change. Peri-Wound Care Topical Primary Dressing Promogran Prisma Matrix, 4.34 (sq in) (silver collagen) Discharge Instruction: Moisten with saline or hydrogel-pack into undermining Secondary Dressing Woven Gauze Sponge, Non-Sterile 4x4 in Discharge Instruction: Apply saline moist over primary then dry gauze ComfortFoam Border, 4x4 in (silicone border) Discharge Instruction: Apply over primary dressing as directed. Secured With Compression Wrap Compression Stockings Facilities managerAdd-Ons Electronic Signature(s) Signed: 11/24/2020 5:21:53 PM By: Antonieta IbaBarnhart, Jodi Signed: 11/28/2020 8:07:32 AM By: Karl Itoawkins, Destiny Entered By: Karl Itoawkins, Destiny on 11/24/2020 16:48:11 -------------------------------------------------------------------------------- Wound Assessment Details Patient Name: Date of Service: [name], Sarah D. 11/24/2020 8:45 A M Medical Record Number: [id number] Patient Account Number: [id number] Date of Birth/Sex: Treating RN: [birth year] (80 y.o. Sarah CluckF) Barnhart, Jodi Primary Care Shaana Acocella: Fleet ContrasAvbuere, Edwin Other Clinician: Referring Derrius Furtick: Treating  Judith Campillo/Extender: Burnetta Sabinobson, Michael Avbuere, Edwin Weeks in Treatment: 2 Wound Status Wound Number: 5 Primary Etiology: Pressure Ulcer Wound Location: Left Gluteus Wound Status: Open Wounding Event: Gradually Appeared Comorbid History: Hypertension, Dementia Date Acquired: 09/12/2020 Weeks Of Treatment: 2 Clustered Wound: No Photos Wound Measurements Length: (cm) 1 Width: (cm) 2.5 Depth: (cm) 0.8 Area: (cm) 1.963 Volume: (cm) 1.571 % Reduction in Area: 16.7% % Reduction in Volume: 33.3% Epithelialization: None Tunneling: No Undermining: Yes Starting Position (o'clock): 12 Ending Position (o'clock): 4 Maximum Distance: (cm) 7.3 Wound Description Classification: Category/Stage II Wound Margin: Well defined, not attached Exudate Amount: Medium Exudate Type: Purulent Exudate Color: yellow, brown, green Foul Odor After Cleansing: No Slough/Fibrino No Wound Bed Granulation Amount: Large (67-100%) Exposed Structure Granulation Quality: Red Fascia Exposed: No Necrotic Amount: None Present (0%) Fat Layer (Subcutaneous Tissue) Exposed: Yes Tendon Exposed: No Muscle Exposed: No Joint Exposed: No Bone Exposed: No Treatment Notes Wound #5 (Gluteus) Wound  Laterality: Left Cleanser Soap and Water Discharge Instruction: May shower and wash wound with dial antibacterial soap and water prior to dressing change. Peri-Wound Care Topical Primary Dressing Promogran Prisma Matrix, 4.34 (sq in) (silver collagen) Discharge Instruction: Moisten with saline or hydrogel-pack into undermining Secondary Dressing Woven Gauze Sponge, Non-Sterile 4x4 in Discharge Instruction: Apply saline moist over primary then dry gauze ComfortFoam Border, 4x4 in (silicone border) Discharge Instruction: Apply over primary dressing as directed. Secured With Compression Wrap Compression Stockings Facilities manager) Signed: 11/24/2020 5:21:53 PM By: Antonieta Iba Signed: 11/28/2020 8:07:32  AM By: Karl Ito Entered By: Karl Ito on 11/24/2020 16:48:36 -------------------------------------------------------------------------------- Wound Assessment Details Patient Name: Date of Service: Sarah Hose D. 11/24/2020 8:45 A M Medical Record Number: [id number] Patient Account Number: [id number] Date of Birth/Sex: Treating RN: February 29, 1941 (80 y.o. [name] Primary Care Montario Zilka: [name] Other Clinician: Referring [name]: Treating Joclynn Lumb/Extender: Burnetta Sabin in Treatment: 2 Wound Status Wound Number: 6 Primary Etiology: Pressure Ulcer Wound Location: Midline Back Wound Status: Open Wounding Event: Surgical Injury Comorbid History: Hypertension, Dementia Date Acquired: 09/12/2020 Weeks Of Treatment: 2 Clustered Wound: No Photos Wound Measurements Length: (cm) 5 Width: (cm) 5 Depth: (cm) 0.7 Area: (cm) 19.635 Volume: (cm) 13.744 % Reduction in Area: -11.1% % Reduction in Volume: 22.2% Epithelialization: None Tunneling: No Undermining: Yes Starting Position (o'clock): 11 Ending Position (o'clock): 6 Maximum Distance: (cm) 3.7 Wound Description Classification: Category/Stage III Wound Margin: Well defined, not attached Exudate Amount: Medium Exudate Type: Purulent Exudate Color: yellow, brown, green Foul Odor After Cleansing: No Slough/Fibrino Yes Wound Bed Granulation Amount: Large (67-100%) Exposed Structure Granulation Quality: Red, Pink Fascia Exposed: No Necrotic Amount: Small (1-33%) Fat Layer (Subcutaneous Tissue) Exposed: Yes Necrotic Quality: Adherent Slough Tendon Exposed: No Muscle Exposed: No Joint Exposed: No Bone Exposed: No Treatment Notes Wound #6 (Back) Wound Laterality: Midline Cleanser Soap and Water Discharge Instruction: May shower and wash wound with dial antibacterial soap and water prior to dressing change. Peri-Wound Care Topical Primary Dressing Promogran Prisma  Matrix, 4.34 (sq in) (silver collagen) Discharge Instruction: Moisten collagen with saline or hydrogel Secondary Dressing Woven Gauze Sponge, Non-Sterile 4x4 in Discharge Instruction: Apply saline moist over primary then dry gauze ComfortFoam Border, 6x6 in (silicone border) Discharge Instruction: Apply over primary dressing as directed. Secured With Compression Wrap Compression Stockings Facilities manager) Signed: 11/24/2020 5:21:53 PM By: Antonieta Iba Signed: 11/28/2020 8:07:32 AM By: Karl Ito Entered By: Karl Ito on 11/24/2020 16:47:31 -------------------------------------------------------------------------------- Vitals Details Patient Name: Date of Service: Sarah Hose D. 11/24/2020 8:45 A M Medical Record Number: [id number] Patient Account Number: [id number] Date of Birth/Sex: Treating RN: Aug 01, 1941 (80 y.o. [name] Primary Care Kathrene Sinopoli: [name] Other Clinician: Referring Sherlock Nancarrow: Treating Jaymie Misch/Extender: Burnetta Sabin in Treatment: 2 Vital Signs Time Taken: 08:39 Temperature (F): 98.3 Pulse (bpm): 84 Respiratory Rate (breaths/min): 17 Blood Pressure (mmHg): 119/69 Reference Range: 80 - 120 mg / dl Electronic Signature(s) Signed: 11/28/2020 8:07:32 AM By: Karl Ito Entered By: Karl Ito on 11/24/2020 08:39:27

## 2020-12-08 ENCOUNTER — Encounter (HOSPITAL_BASED_OUTPATIENT_CLINIC_OR_DEPARTMENT_OTHER): Payer: Medicare Other | Admitting: Internal Medicine

## 2020-12-08 ENCOUNTER — Other Ambulatory Visit: Payer: Self-pay

## 2020-12-08 DIAGNOSIS — L89323 Pressure ulcer of left buttock, stage 3: Secondary | ICD-10-CM | POA: Diagnosis not present

## 2020-12-08 DIAGNOSIS — T8131XA Disruption of external operation (surgical) wound, not elsewhere classified, initial encounter: Secondary | ICD-10-CM | POA: Diagnosis not present

## 2020-12-08 DIAGNOSIS — L89153 Pressure ulcer of sacral region, stage 3: Secondary | ICD-10-CM

## 2020-12-08 DIAGNOSIS — L89103 Pressure ulcer of unspecified part of back, stage 3: Secondary | ICD-10-CM

## 2020-12-08 NOTE — Progress Notes (Addendum)
[name] ([id number]) Visit Report for 12/08/2020 Arrival Information Details Patient Name: Date of Service: [name], [name] 12/08/2020 8:45 A M Medical Record Number: [id number] Patient Account Number: [id number] Date of Birth/Sex: Treating RN: 10-24-1940 (80 y.o. Helene Shoe, Tammi Klippel Primary Care : Nolene Ebbs Other Clinician: Referring : Treating /Extender: Marin Olp in Treatment: 4 Visit Information History Since Last Visit Added or deleted any medications: No Patient Arrived: Stretcher Any new allergies or adverse reactions: No Arrival Time: 08:30 Had a fall or experienced change in No Accompanied By: EMS activities of daily living that may affect Transfer Assistance: Manual risk of falls: Patient Identification Verified: Yes Signs or symptoms of abuse/neglect since last visito No Secondary Verification Process Completed: Yes Hospitalized since last visit: No Patient Requires Transmission-Based Precautions: No Implantable device outside of the clinic excluding No Patient Has Alerts: No cellular tissue based products placed in the center since last visit: Has Dressing in Place as Prescribed: Yes Pain Present Now: No Electronic Signature(s) Signed: 12/08/2020 5:30:47 PM By: Deon Pilling Entered By: Deon Pilling on 12/08/2020 08:46:09 -------------------------------------------------------------------------------- Encounter Discharge Information Details Patient Name: Date of Service: Sarah Seltzer D. 12/08/2020 8:45 A M Medical Record Number: [id number] Patient Account Number: [id number] Date of Birth/Sex: Treating RN: [birth year] (80 y.o. Debby Bud Primary Care : Nolene Ebbs Other Clinician: Referring : Treating /Extender: Marin Olp in Treatment: 4 Encounter Discharge Information Items Post Procedure Vitals Discharge Condition:  Stable Temperature (F): 99 Ambulatory Status: Stretcher Pulse (bpm): 112 Discharge Destination: Home Respiratory Rate (breaths/min): 20 Transportation: Private Auto Blood Pressure (mmHg): 121/82 Accompanied By: son and EMS crew Schedule Follow-up Appointment: Yes Clinical Summary of Care: Electronic Signature(s) Signed: 12/08/2020 5:30:47 PM By: Deon Pilling Entered By: Deon Pilling on 12/08/2020 11:43:14 -------------------------------------------------------------------------------- Lower Extremity Assessment Details Patient Name: Date of Service: [name] 12/08/2020 8:45 A M Medical Record Number: [id number] Patient Account Number: [id number] Date of Birth/Sex: Treating RN: 1940/12/12 (80 y.o. Debby Bud Primary Care : Nolene Ebbs Other Clinician: Referring : Treating /Extender: Marin Olp in Treatment: 4 Electronic Signature(s) Signed: 12/08/2020 5:30:47 PM By: Deon Pilling Entered By: Deon Pilling on 12/08/2020 08:46:39 -------------------------------------------------------------------------------- Multi Wound Chart Details Patient Name: Date of Service: Sarah Seltzer D. 12/08/2020 8:45 A M Medical Record Number: [id number] Patient Account Number: [id number] Date of Birth/Sex: Treating RN: September 24, 1940 (80 y.o. Sue Lush Primary Care : Nolene Ebbs Other Clinician: Referring : Treating /Extender: Marin Olp in Treatment: 4 Vital Signs Height(in): Pulse(bpm): 112 Weight(lbs): Blood Pressure(mmHg): 121/82 Body Mass Index(BMI): Temperature(F): 99 Respiratory Rate(breaths/min): 20 Photos: [3:No Photos Sacrum] [4:No Photos Left, Proximal Gluteus] [5:No Photos Left Gluteus] Wound Location: [3:Gradually Appeared] [4:Gradually Appeared] [5:Gradually Appeared] Wounding Event: [3:Pressure Ulcer] [4:Pressure Ulcer] [5:Pressure  Ulcer] Primary Etiology: [3:Hypertension, Dementia] [4:Hypertension, Dementia] [5:Hypertension, Dementia] Comorbid History: [3:09/12/2020] [4:09/12/2020] [5:09/12/2020] Date Acquired: [3:4] [4:4] [5:4] Weeks of Treatment: [3:Open] [4:Open] [5:Open] Wound Status: [3:3x1x0.1] [4:0.1x0.1x0.1] [5:1.1x2x1.3] Measurements L x W x D (cm) [3:2.356] [4:0.008] [5:1.728] A (cm) : rea [3:0.236] [4:0.001] [5:2.246] Volume (cm) : [3:62.50%] [4:98.20%] [5:26.70%] % Reduction in A rea: [3:92.50%] [4:97.70%] [5:4.70%] % Reduction in Volume: [5:3] Position 1 (o'clock): [5:7] Maximum Distance 1 (cm): [3:No] [4:No] [5:Yes] Tunneling: [3:No] [4:No] [5:No] Undermining: [3:Category/Stage III] [4:Category/Stage II] [5:Category/Stage II] Classification: [3:Medium] [4:Medium] [5:Large] Exudate A mount: [3:Purulent] [4:Serosanguineous] [5:Purulent] Exudate Type: [3:yellow, brown, green] [4:red, brown] [5:yellow, brown, green] Exudate Color: [3:Flat and Intact] [4:Flat and Intact] [5:Well defined, not attached] Wound  Margin: [3:Large (67-100%)] [4:Medium (34-66%)] [5:Large (67-100%)] Granulation A mount: [3:Red, Pink] [4:Red, Pink] [5:Red] Granulation Quality: [3:Small (1-33%)] [4:Medium (34-66%)] [5:None Present (0%)] Necrotic A mount: [3:Fat Layer (Subcutaneous Tissue): Yes Fat Layer (Subcutaneous Tissue): Yes Fat Layer (Subcutaneous Tissue): Yes] Exposed Structures: [3:Fascia: No Tendon: No Muscle: No Joint: No Bone: No Small (1-33%)] [4:Fascia: No Tendon: No Muscle: No Joint: No Bone: No Large (67-100%)] [5:Fascia: No Tendon: No Muscle: No Joint: No Bone: No None] Epithelialization: [3:Debridement - Excisional] [4:N/A] [5:N/A] Debridement: [3:09:45] [4:N/A] [5:N/A] Pre-procedure Verification/Time Out Taken: [3:Lidocaine 4% Topical Solution] [4:N/A] [5:N/A] Pain Control: [3:Subcutaneous, Slough] [4:N/A] [5:N/A] Tissue Debrided: [3:Skin/Subcutaneous Tissue] [4:N/A] [5:N/A] Level: [3:3] [4:N/A]  [5:N/A] Debridement A (sq cm): [3:rea Curette] [4:N/A] [5:N/A] Instrument: [3:Minimum] [4:N/A] [5:N/A] Bleeding: [3:Pressure] [4:N/A] [5:N/A] Hemostasis A chieved: [3:Procedure was tolerated well] [4:N/A] [5:N/A] Debridement Treatment Response: [3:3x1x0.1] [4:N/A] [5:N/A] Post Debridement Measurements L x W x D (cm) [3:0.236] [4:N/A] [5:N/A] Post Debridement Volume: (cm) [3:Category/Stage III] [4:N/A] [5:N/A] Post Debridement Stage: [3:Debridement] [4:N/A] [5:N/A] Wound Number: 6 N/A N/A Photos: No Photos N/A N/A Midline Back N/A N/A Wound Location: Surgical Injury N/A N/A Wounding Event: Pressure Ulcer N/A N/A Primary Etiology: Hypertension, Dementia N/A N/A Comorbid History: 09/12/2020 N/A N/A Date Acquired: 4 N/A N/A Weeks of Treatment: Open N/A N/A Wound Status: 5.5x5.5x0.8 N/A N/A Measurements L x W x D (cm) 23.758 N/A N/A A (cm) : rea 19.007 N/A N/A Volume (cm) : -34.40% N/A N/A % Reduction in A rea: -7.60% N/A N/A % Reduction in Volume: 11 Starting Position 1 (o'clock): 6 Ending Position 1 (o'clock): 3 Maximum Distance 1 (cm): No N/A N/A Tunneling: Yes N/A N/A Undermining: Category/Stage III N/A N/A Classification: Large N/A N/A Exudate A mount: Purulent N/A N/A Exudate Type: yellow, brown, green N/A N/A Exudate Color: Well defined, not attached N/A N/A Wound Margin: Large (67-100%) N/A N/A Granulation A mount: Red, Pink N/A N/A Granulation Quality: Small (1-33%) N/A N/A Necrotic A mount: Fat Layer (Subcutaneous Tissue): Yes N/A N/A Exposed Structures: Fascia: No Tendon: No Muscle: No Joint: No Bone: No None N/A N/A Epithelialization: N/A N/A N/A Debridement: N/A N/A N/A Pain Control: N/A N/A N/A Tissue Debrided: N/A N/A N/A Level: N/A N/A N/A Debridement A (sq cm): rea N/A N/A N/A Instrument: N/A N/A N/A Bleeding: N/A N/A N/A Hemostasis A chieved: Debridement Treatment Response: N/A N/A N/A Post Debridement  Measurements L x N/A N/A N/A W x D (cm) N/A N/A N/A Post Debridement Volume: (cm) N/A N/A N/A Post Debridement Stage: N/A N/A N/A Procedures Performed: Treatment Notes Electronic Signature(s) Signed: 12/08/2020 10:40:38 AM By: Kalman Shan DO Signed: 12/08/2020 5:13:52 PM By: Lorrin Jackson Entered By: Kalman Shan on 12/08/2020 10:32:54 -------------------------------------------------------------------------------- Multi-Disciplinary Care Plan Details Patient Name: Date of Service: Sarah Seltzer D. 12/08/2020 8:45 A M Medical Record Number: [id number] Patient Account Number: [id number] Date of Birth/Sex: Treating RN: 04-19-1941 (80 y.o. Sue Lush Primary Care : Nolene Ebbs Other Clinician: Referring : Treating /Extender: Marin Olp in Treatment: 4 Active Inactive Pressure Nursing Diagnoses: Knowledge deficit related to causes and risk factors for pressure ulcer development Goals: Patient will remain free from development of additional pressure ulcers Date Initiated: 11/10/2020 Target Resolution Date: 12/15/2020 Goal Status: Active Interventions: Assess: immobility, friction, shearing, incontinence upon admission and as needed Assess offloading mechanisms upon admission and as needed Assess potential for pressure ulcer upon admission and as needed Provide education on pressure ulcers Treatment Activities: Patient referred for home evaluation of offloading devices/mattresses : 11/10/2020  Notes: Wound/Skin Impairment Nursing Diagnoses: Impaired tissue integrity Goals: Patient/caregiver will verbalize understanding of skin care regimen Date Initiated: 11/10/2020 Date Inactivated: 12/08/2020 Target Resolution Date: 12/08/2020 Goal Status: Met Ulcer/skin breakdown will have a volume reduction of 30% by week 4 Date Initiated: 11/10/2020 Target Resolution Date: 12/08/2020 Goal Status:  Active Interventions: Assess patient/caregiver ability to obtain necessary supplies Assess patient/caregiver ability to perform ulcer/skin care regimen upon admission and as needed Assess ulceration(s) every visit Provide education on ulcer and skin care Treatment Activities: Skin care regimen initiated : 11/10/2020 Topical wound management initiated : 11/10/2020 Notes: Electronic Signature(s) Signed: 12/08/2020 8:45:44 AM By: Lorrin Jackson Entered By: Lorrin Jackson on 12/08/2020 08:45:44 -------------------------------------------------------------------------------- Pain Assessment Details Patient Name: Date of Service: Sarah Seltzer D. 12/08/2020 8:45 A M Medical Record Number: [id number] Patient Account Number: [id number] Date of Birth/Sex: Treating RN: 1941-04-01 (80 y.o. Debby Bud Primary Care : Nolene Ebbs Other Clinician: Referring : Treating /Extender: Marin Olp in Treatment: 4 Active Problems Location of Pain Severity and Description of Pain Patient Has Paino No Site Locations Rate the pain. Current Pain Level: 0 Pain Management and Medication Current Pain Management: Medication: No Cold Application: No Rest: No Massage: No Activity: No T.E.N.S.: No Heat Application: No Leg drop or elevation: No Is the Current Pain Management Adequate: Adequate How does your wound impact your activities of daily livingo Sleep: No Bathing: No Appetite: No Relationship With Others: No Bladder Continence: No Emotions: No Bowel Continence: No Work: No Toileting: No Drive: No Dressing: No Hobbies: No Electronic Signature(s) Signed: 12/08/2020 5:30:47 PM By: Deon Pilling Entered By: Deon Pilling on 12/08/2020 08:46:33 -------------------------------------------------------------------------------- Patient/Caregiver Education Details Patient Name: Date of Service: [name] 4/29/2022andnbsp8:45 A  M Medical Record Number: [id number] Patient Account Number: [id number] Date of Birth/Gender: Treating RN: 12/27/1940 (80 y.o. Sue Lush Primary Care Physician: Nolene Ebbs Other Clinician: Referring Physician: Treating Physician/Extender: Marin Olp in Treatment: 4 Education Assessment Education Provided To: Patient and Caregiver Education Topics Provided Pressure: Methods: Explain/Verbal, Printed Responses: State content correctly Wound/Skin Impairment: Methods: Demonstration, Explain/Verbal, Printed Responses: State content correctly Electronic Signature(s) Signed: 12/08/2020 5:13:52 PM By: Lorrin Jackson Entered By: Lorrin Jackson on 12/08/2020 08:46:04 -------------------------------------------------------------------------------- Wound Assessment Details Patient Name: Date of Service: Sarah Seltzer D. 12/08/2020 8:45 A M Medical Record Number: [id number] Patient Account Number: [id number] Date of Birth/Sex: Treating RN: 1941-07-05 (80 y.o. Debby Bud Primary Care : Nolene Ebbs Other Clinician: Referring : Treating /Extender: Marin Olp in Treatment: 4 Wound Status Wound Number: 3 Primary Etiology: Pressure Ulcer Wound Location: Sacrum Wound Status: Open Wounding Event: Gradually Appeared Comorbid History: Hypertension, Dementia Date Acquired: 09/12/2020 Weeks Of Treatment: 4 Clustered Wound: No Photos Wound Measurements Length: (cm) 3 Width: (cm) 1 Depth: (cm) 0.1 Area: (cm) 2.356 Volume: (cm) 0.236 % Reduction in Area: 62.5% % Reduction in Volume: 92.5% Epithelialization: Small (1-33%) Tunneling: No Undermining: No Wound Description Classification: Category/Stage III Wound Margin: Flat and Intact Exudate Amount: Medium Exudate Type: Purulent Exudate Color: yellow, brown, green Foul Odor After Cleansing: No Slough/Fibrino Yes Wound Bed Granulation  Amount: Large (67-100%) Exposed Structure Granulation Quality: Red, Pink Fascia Exposed: No Necrotic Amount: Small (1-33%) Fat Layer (Subcutaneous Tissue) Exposed: Yes Necrotic Quality: Adherent Slough Tendon Exposed: No Muscle Exposed: No Joint Exposed: No Bone Exposed: No Treatment Notes Wound #3 (Sacrum) Cleanser Soap and Water Discharge Instruction: May shower and wash wound with dial antibacterial soap and water prior to dressing change. Peri-Wound  Care Topical Primary Dressing Promogran Prisma Matrix, 4.34 (sq in) (silver collagen) Discharge Instruction: Moisten with saline or hydrogel-pack into undermining Secondary Dressing Woven Gauze Sponge, Non-Sterile 4x4 in Discharge Instruction: Apply saline moist over primary then dry gauze ComfortFoam Border, 4x4 in (silicone border) Discharge Instruction: Apply over primary dressing as directed. Secured With Compression Wrap Compression Stockings Add-Ons Notes saline moisten gauze backing the prisma, did not packed wound as directed, dry gauze, and foam border to midline back wound. Electronic Signature(s) Signed: 12/08/2020 4:35:52 PM By: Sandre Kitty Signed: 12/08/2020 5:30:47 PM By: Deon Pilling Entered By: Sandre Kitty on 12/08/2020 16:13:23 -------------------------------------------------------------------------------- Wound Assessment Details Patient Name: Date of Service: Sarah Seltzer D. 12/08/2020 8:45 A M Medical Record Number: [id number] Patient Account Number: [id number] Date of Birth/Sex: Treating RN: 09-05-1940 (80 y.o. Helene Shoe, Tammi Klippel Primary Care Jamekia Gannett: Nolene Ebbs Other Clinician: Referring Taimi Towe: Treating Delissa Silba/Extender: Marin Olp in Treatment: 4 Wound Status Wound Number: 4 Primary Etiology: Pressure Ulcer Wound Location: Left, Proximal Gluteus Wound Status: Open Wounding Event: Gradually Appeared Comorbid History: Hypertension, Dementia Date  Acquired: 09/12/2020 Weeks Of Treatment: 4 Clustered Wound: No Photos Wound Measurements Length: (cm) 0.1 Width: (cm) 0.1 Depth: (cm) 0.1 Area: (cm) 0.008 Volume: (cm) 0.001 % Reduction in Area: 98.2% % Reduction in Volume: 97.7% Epithelialization: Large (67-100%) Tunneling: No Undermining: No Wound Description Classification: Category/Stage II Wound Margin: Flat and Intact Exudate Amount: Medium Exudate Type: Serosanguineous Exudate Color: red, brown Foul Odor After Cleansing: No Slough/Fibrino Yes Wound Bed Granulation Amount: Medium (34-66%) Exposed Structure Granulation Quality: Red, Pink Fascia Exposed: No Necrotic Amount: Medium (34-66%) Fat Layer (Subcutaneous Tissue) Exposed: Yes Necrotic Quality: Adherent Slough Tendon Exposed: No Muscle Exposed: No Joint Exposed: No Bone Exposed: No Treatment Notes Wound #4 (Gluteus) Wound Laterality: Left, Proximal Cleanser Peri-Wound Care Skin Prep Discharge Instruction: Use skin prep as directed Topical Primary Dressing Iodoform packing strip 1/4 (in) Discharge Instruction: Lightly pack as instructed Secondary Dressing Woven Gauze Sponges 2x2 in Discharge Instruction: Apply over primary dressing as directed. Zetuvit Plus Silicone Border Dressing 4x4 (in/in) Discharge Instruction: Apply silicone border over primary dressing as directed. Secured With Compression Wrap Compression Stockings Add-Ons Notes saline moisten gauze backing the prisma, did not packed wound as directed, dry gauze, and foam border to midline back wound. Electronic Signature(s) Signed: 12/08/2020 4:35:52 PM By: Sandre Kitty Signed: 12/08/2020 5:30:47 PM By: Deon Pilling Entered By: Sandre Kitty on 12/08/2020 16:12:28 -------------------------------------------------------------------------------- Wound Assessment Details Patient Name: Date of Service: Sarah Seltzer D. 12/08/2020 8:45 A M Medical Record Number: [id number] Patient  Account Number: [id number] Date of Birth/Sex: Treating RN: March 24, 1941 (80 y.o. Helene Shoe, Tammi Klippel Primary Care Briany Aye: Nolene Ebbs Other Clinician: Referring Shahir Karen: Treating Gabbriella Presswood/Extender: Marin Olp in Treatment: 4 Wound Status Wound Number: 5 Primary Etiology: Pressure Ulcer Wound Location: Left Gluteus Wound Status: Open Wounding Event: Gradually Appeared Comorbid History: Hypertension, Dementia Date Acquired: 09/12/2020 Weeks Of Treatment: 4 Clustered Wound: No Photos Wound Measurements Length: (cm) 1.1 Width: (cm) 2 Depth: (cm) 1.3 Area: (cm) 1.728 Volume: (cm) 2.246 % Reduction in Area: 26.7% % Reduction in Volume: 4.7% Epithelialization: None Tunneling: Yes Position (o'clock): 3 Maximum Distance: (cm) 7 Undermining: No Wound Description Classification: Category/Stage II Wound Margin: Well defined, not attached Exudate Amount: Large Exudate Type: Purulent Exudate Color: yellow, brown, green Foul Odor After Cleansing: No Slough/Fibrino No Wound Bed Granulation Amount: Large (67-100%) Exposed Structure Granulation Quality: Red Fascia Exposed: No Necrotic Amount: None Present (0%) Fat Layer (Subcutaneous Tissue) Exposed:  Yes Tendon Exposed: No Muscle Exposed: No Joint Exposed: No Bone Exposed: No Treatment Notes Wound #5 (Gluteus) Wound Laterality: Left Cleanser Peri-Wound Care Skin Prep Discharge Instruction: Use skin prep as directed Topical Primary Dressing Promogran Prisma Matrix, 4.34 (sq in) (silver collagen) Discharge Instruction: Moisten collagen with saline or hydrogel Secondary Dressing Zetuvit Plus 4x4 in Discharge Instruction: Apply over primary dressing as directed. Secured With Compression Wrap Compression Stockings Add-Ons Notes saline moisten gauze backing the prisma, did not packed wound as directed, dry gauze, and foam border to midline back wound. Electronic Signature(s) Signed: 12/08/2020  4:35:52 PM By: Sandre Kitty Signed: 12/08/2020 5:30:47 PM By: Deon Pilling Entered By: Sandre Kitty on 12/08/2020 16:13:41 -------------------------------------------------------------------------------- Wound Assessment Details Patient Name: Date of Service: Sarah Seltzer D. 12/08/2020 8:45 A M Medical Record Number: [id number] Patient Account Number: [id number] Date of Birth/Sex: Treating RN: Nov 09, 1940 (80 y.o. Helene Shoe, Tammi Klippel Primary Care : Nolene Ebbs Other Clinician: Referring : Treating /Extender: Marin Olp in Treatment: 4 Wound Status Wound Number: 6 Primary Etiology: Pressure Ulcer Wound Location: Midline Back Wound Status: Open Wounding Event: Surgical Injury Comorbid History: Hypertension, Dementia Date Acquired: 09/12/2020 Weeks Of Treatment: 4 Clustered Wound: No Photos Wound Measurements Length: (cm) 5.5 Width: (cm) 5.5 Depth: (cm) 0.8 Area: (cm) 23.758 Volume: (cm) 19.007 % Reduction in Area: -34.4% % Reduction in Volume: -7.6% Epithelialization: None Tunneling: No Undermining: Yes Starting Position (o'clock): 11 Ending Position (o'clock): 6 Maximum Distance: (cm) 3 Wound Description Classification: Category/Stage III Wound Margin: Well defined, not attached Exudate Amount: Large Exudate Type: Purulent Exudate Color: yellow, brown, green Foul Odor After Cleansing: No Slough/Fibrino Yes Wound Bed Granulation Amount: Large (67-100%) Exposed Structure Granulation Quality: Red, Pink Fascia Exposed: No Necrotic Amount: Small (1-33%) Fat Layer (Subcutaneous Tissue) Exposed: Yes Necrotic Quality: Adherent Slough Tendon Exposed: No Muscle Exposed: No Joint Exposed: No Bone Exposed: No Treatment Notes Wound #6 (Back) Wound Laterality: Midline Cleanser Peri-Wound Care Skin Prep Discharge Instruction: Use skin prep as directed Topical Primary Dressing Promogran Prisma Matrix, 4.34  (sq in) (silver collagen) Discharge Instruction: Moisten collagen with saline or hydrogel Secondary Dressing Woven Gauze Sponge, Non-Sterile 4x4 in Discharge Instruction: Apply over primary dressing as directed. Zetuvit Plus Silicone Border Dressing 4x4 (in/in) Discharge Instruction: Apply silicone border over primary dressing as directed. Secured With Compression Wrap Compression Stockings Add-Ons Notes saline moisten gauze backing the prisma, did not packed wound as directed, dry gauze, and foam border to midline back wound. Electronic Signature(s) Signed: 12/08/2020 4:35:52 PM By: Sandre Kitty Signed: 12/08/2020 5:30:47 PM By: Deon Pilling Entered By: Sandre Kitty on 12/08/2020 16:12:02 -------------------------------------------------------------------------------- Vitals Details Patient Name: Date of Service: Sarah Seltzer D. 12/08/2020 8:45 A M Medical Record Number: [id number] Patient Account Number: [id number] Date of Birth/Sex: Treating RN: [birth year] (80 y.o. Debby Bud Primary Care : Nolene Ebbs Other Clinician: Referring : Treating /Extender: Marin Olp in Treatment: 4 Vital Signs Time Taken: 08:30 Temperature (F): 99 Pulse (bpm): 112 Respiratory Rate (breaths/min): 20 Blood Pressure (mmHg): 121/82 Reference Range: 80 - 120 mg / dl Electronic Signature(s) Signed: 12/08/2020 5:30:47 PM By: Deon Pilling Entered By: Deon Pilling on 12/08/2020 85:46:27

## 2020-12-08 NOTE — Progress Notes (Signed)
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 bleeding was controlled with Pressure. The procedure was tolerated well. Post Debridement Measurements: 3cm length x 1cm width x 0.1cm depth; 0.236cm^3 volume. Post debridement Stage noted as Category/Stage III. Character of Wound/Ulcer Post Debridement is stable. Post procedure Diagnosis Wound #3: Same as Pre-Procedure Plan Follow-up Appointments: Return Appointment in 2 weeks. - with Dr. Leanord Hawking **Comes By Stretcher** Off-Loading: Low air-loss mattress (Group 2) Turn and reposition every 2 hours Additional Orders / Instructions: Follow Nutritious Diet - Continue [facility] Home Health: New wound care orders this week; continue Home Health for wound care. May utilize formulary equivalent dressing for wound treatment orders unless otherwise specified. - Left Gluteal Wound: pack tunnel (lightly) and  wound with Iodoform packing strip. Do not pack saline gauze into undermining of back wound, just place saline gauze in wound bed. Other Home Health Orders/Instructions: - [facility] for [facility]ound Care 2-3x week WOUND #3: - Sacrum Wound Laterality: Cleanser: Soap and Water [facility]) Every Other Day/30 Days Discharge Instructions: May shower and wash wound with dial antibacterial soap and water prior to dressing change. Prim Dressing: Promogran Prisma Matrix, 4.34 (sq in) (silver collagen) (Home Health) Every Other Day/30 Days ary Discharge Instructions: Moisten with saline or hydrogel-pack into undermining Secondary Dressing: Woven Gauze Sponge, Non-Sterile 4x4 in [facility]) Every Other Day/30 Days Discharge Instructions: Apply saline moist over primary then dry gauze Secondary Dressing: ComfortFoam Border, 4x4 in (silicone border) [facility]) Every Other Day/30 Days Discharge Instructions: Apply over primary dressing as directed. 1. Silver collagen 2. Iodoform to the tunnel wound 3. Follow-up in 2 weeks Electronic Signature(s) Signed: 12/08/2020 10:40:38 AM By: Geralyn Corwin DO Entered By: Geralyn Corwin on 12/08/2020 10:39:30 -------------------------------------------------------------------------------- HxROS Details Patient Name: Date of Service: Sarah Hose D. 12/08/2020 8:45 A M Medical Record Number: [id number] Patient Account Number: [id number] Date of Birth/Sex: Treating RN: [name] (80 y.o. [name] Primary Care Provider: [name] Other Clinician: Referring Provider: Treating Provider/Extender: Junious Silk in Treatment: 4 Unable to Obtain Patient History due to Dementia Information Obtained From Patient Eyes Medical History: Negative for: Cataracts; Glaucoma; Optic Neuritis Ear/Nose/Mouth/Throat Medical History: Negative for: Chronic sinus problems/congestion; Middle ear  problems Hematologic/Lymphatic Medical History: Negative for: Anemia; Hemophilia; Human Immunodeficiency Virus; Lymphedema; Sickle Cell Disease Respiratory Medical History: Negative for: Aspiration; Asthma; Chronic Obstructive Pulmonary Disease (COPD); Pneumothorax; Sleep Apnea; Tuberculosis Cardiovascular Medical History: Positive for: Hypertension Negative for: Angina; Arrhythmia; Congestive Heart Failure; Coronary Artery Disease; Deep Vein Thrombosis; Hypotension; Myocardial Infarction; Peripheral Arterial Disease; Peripheral Venous Disease; Phlebitis; Vasculitis Gastrointestinal Medical History: Negative for: Cirrhosis ; Colitis; Crohns; Hepatitis A; Hepatitis B; Hepatitis C Endocrine Medical History: Negative for: Type I Diabetes; Type II Diabetes Genitourinary Medical History: Negative for: End Stage Renal Disease Immunological Medical History: Negative for: Lupus Erythematosus; Raynauds; Scleroderma Integumentary (Skin) Medical History: Negative for: History of Burn Musculoskeletal Medical History: Negative for: Gout; Rheumatoid Arthritis; Osteoarthritis; Osteomyelitis Neurologic Medical History: Positive for: Dementia Negative for: Neuropathy; Quadriplegia; Paraplegia; Seizure Disorder Oncologic Medical History: Negative for: Received Chemotherapy; Received Radiation Psychiatric Medical History: Negative for: Anorexia/bulimia; Confinement Anxiety Immunizations Pneumococcal Vaccine: Received Pneumococcal Vaccination: No Implantable Devices  None Hospitalization / Surgery History Type of Hospitalization/Surgery OR debridement/ metal plate removal 10/17 Family and Social History Cancer: Yes - Siblings; Diabetes: No; Heart Disease: No; Hereditary Spherocytosis: No; Hypertension: No; Kidney Disease: No; Lung Disease: Yes - Siblings; Seizures: No; Stroke: No; Thyroid Problems: No; Tuberculosis: No; Former smoker; Marital Status - Widowed; Alcohol Use: Never; Drug  Use: No History; Caffeine Use: Daily; Financial Concerns: No; Food, Clothing or Shelter Needs: No; Support System Lacking: No; Transportation Concerns: No Electronic Signature(s) Signed: 12/08/2020 10:40:38 AM By: Geralyn Corwin DO Signed: 12/08/2020 5:13:52 PM By: Antonieta Iba Entered By: Geralyn Corwin on 12/08/2020 10:34:47 -------------------------------------------------------------------------------- SuperBill Details Patient Name: Date of Service: [name] 12/08/2020 Medical Record Number: [id number] Patient Account Number: [id number] Date of Birth/Sex: Treating RN: [name] (80 y.o. [name] Primary Care Provider: [name] Other Clinician: Referring Provider: Treating Provider/Extender: Junious Silk in Treatment: 4 Diagnosis Coding ICD-10 Codes Code Description L89.103 Pressure ulcer of unspecified part of back, stage 3 L89.153 Pressure ulcer of sacral region, stage 3 L89.323 Pressure ulcer of left buttock, stage 3 T81.31XA Disruption of external operation (surgical) wound, not elsewhere classified, initial encounter Facility Procedures CPT4 Code: [id number] Description: 11042 - DEB SUBQ TISSUE 20 SQ CM/< ICD-10 Diagnosis Description L89.153 Pressure ulcer of sacral region, stage 3 Modifier: Quantity: 1 Physician Procedures : CPT4 Code Description Modifier [id number] 11042 - WC PHYS SUBQ TISS 20 SQ CM ICD-10 Diagnosis Description L89.153 Pressure ulcer of sacral region, stage 3 Quantity: 1 Electronic Signature(s) Signed: 12/08/2020 10:40:38 AM By: Geralyn Corwin DO Entered By: Geralyn Corwin on 12/08/2020 10:40:08

## 2020-12-22 ENCOUNTER — Encounter (HOSPITAL_BASED_OUTPATIENT_CLINIC_OR_DEPARTMENT_OTHER): Payer: Medicare Other | Attending: Internal Medicine | Admitting: Internal Medicine

## 2020-12-22 ENCOUNTER — Other Ambulatory Visit: Payer: Self-pay

## 2020-12-22 DIAGNOSIS — Z8673 Personal history of transient ischemic attack (TIA), and cerebral infarction without residual deficits: Secondary | ICD-10-CM | POA: Insufficient documentation

## 2020-12-22 DIAGNOSIS — L89323 Pressure ulcer of left buttock, stage 3: Secondary | ICD-10-CM | POA: Insufficient documentation

## 2020-12-22 DIAGNOSIS — Z7901 Long term (current) use of anticoagulants: Secondary | ICD-10-CM | POA: Insufficient documentation

## 2020-12-22 DIAGNOSIS — Z981 Arthrodesis status: Secondary | ICD-10-CM | POA: Diagnosis not present

## 2020-12-22 DIAGNOSIS — T8132XA Disruption of internal operation (surgical) wound, not elsewhere classified, initial encounter: Secondary | ICD-10-CM | POA: Insufficient documentation

## 2020-12-22 DIAGNOSIS — F0150 Vascular dementia without behavioral disturbance: Secondary | ICD-10-CM | POA: Insufficient documentation

## 2020-12-22 DIAGNOSIS — Z86711 Personal history of pulmonary embolism: Secondary | ICD-10-CM | POA: Insufficient documentation

## 2020-12-22 DIAGNOSIS — Z86718 Personal history of other venous thrombosis and embolism: Secondary | ICD-10-CM | POA: Diagnosis not present

## 2020-12-22 DIAGNOSIS — Y752 Prosthetic and other implants, materials and neurological devices associated with adverse incidents: Secondary | ICD-10-CM | POA: Diagnosis not present

## 2020-12-22 DIAGNOSIS — L89153 Pressure ulcer of sacral region, stage 3: Secondary | ICD-10-CM | POA: Diagnosis present

## 2020-12-22 DIAGNOSIS — Z87891 Personal history of nicotine dependence: Secondary | ICD-10-CM | POA: Diagnosis not present

## 2020-12-22 DIAGNOSIS — I428 Other cardiomyopathies: Secondary | ICD-10-CM | POA: Diagnosis not present

## 2020-12-22 DIAGNOSIS — I10 Essential (primary) hypertension: Secondary | ICD-10-CM | POA: Diagnosis not present

## 2020-12-22 NOTE — Progress Notes (Signed)
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 VAC however if it is approved by insurance I would like to try. Otherwise this is going to take a very long time to granulate in assuming it ever does that. 4. Family tells me that she is put on quite a bit of weight since starting the PEG tube. She is completely PEG tube dependent. Electronic Signature(s) Signed: 12/22/2020 5:00:10 PM By: Baltazar Najjar MD Entered By: Baltazar Najjar on 12/22/2020 10:07:22 -------------------------------------------------------------------------------- SuperBill Details Patient Name: Date of Service: [name]. 12/22/2020 Medical Record Number: [id number] Patient Account Number: [id number] Date of Birth/Sex: Treating RN: 11-25-1940 (80 y.o. 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, stage 3 L89.323 Pressure ulcer of left buttock, stage 3 T81.31XA Disruption of external operation (surgical) wound, not elsewhere classified, initial encounter Facility Procedures CPT4 Code: [id number] Description: 99214 - WOUND CARE VISIT-LEV 4 EST PT Modifier: Quantity: 1 Physician Procedures : CPT4 Code Description Modifier [id number] 99214 - WC PHYS LEVEL 4 - EST PT ICD-10 Diagnosis Description L89.103 Pressure ulcer of unspecified part of back, stage 3 L89.153 Pressure ulcer of sacral region, stage 3 L89.323 Pressure ulcer of left buttock,  stage 3 T81.31XA Disruption of external operation (surgical) wound, not elsewhere classified, initial encounter Quantity: 1 Electronic Signature(s) Signed: 12/22/2020 5:00:10 PM By: Baltazar Najjar MD Entered By: Baltazar Najjar on 12/22/2020 10:07:45

## 2020-12-22 NOTE — Progress Notes (Addendum)
Hendrie, RAECHELLE SARTI ([id number]) Visit Report for 12/22/2020 Arrival Information Details Patient Name: Date of Service: JENNIFE, ZAUCHA 12/22/2020 8:45 A M Medical Record Number: [id number] Patient Account Number: [id number] Date of Birth/Sex: Treating RN: [birth year] (80 y.o. Sue Lush Primary Care Keygan Dumond: Nolene Ebbs Other Clinician: Referring Jeena Arnett: Treating Shaan Rhoads/Extender: Lenard Galloway in Treatment: 6 Visit Information History Since Last Visit Added or deleted any medications: No Patient Arrived: Stretcher Any new allergies or adverse reactions: No Arrival Time: 09:26 Had a fall or experienced change in No Accompanied By: son activities of daily living that may affect Transfer Assistance: Stretcher risk of falls: Patient Identification Verified: Yes Signs or symptoms of abuse/neglect since last visito No Secondary Verification Process Completed: Yes Hospitalized since last visit: No Patient Requires Transmission-Based Precautions: No Implantable device outside of the clinic excluding No Patient Has Alerts: No cellular tissue based products placed in the center since last visit: Has Dressing in Place as Prescribed: Yes Pain Present Now: Yes Electronic Signature(s) Signed: 12/22/2020 11:30:48 AM By: Sandre Kitty Entered By: Sandre Kitty on 12/22/2020 09:26:28 -------------------------------------------------------------------------------- Clinic Level of Care Assessment Details Patient Name: Date of Service: TYTIONNA, CLOYD 12/22/2020 8:45 A M Medical Record Number: [id number] Patient Account Number: [id number] Date of Birth/Sex: Treating RN: [birth year] (80 y.o. Sue Lush Primary Care Ruhan Borak: Nolene Ebbs Other Clinician: Referring Aleece Loyd: Treating Dior Dominik/Extender: Lenard Galloway in Treatment: 6 Clinic Level of Care Assessment Items TOOL 4 Quantity Score X- 1 0 Use when only an  EandM is performed on FOLLOW-UP visit ASSESSMENTS - Nursing Assessment / Reassessment X- 1 10 Reassessment of Co-morbidities (includes updates in patient status) '[]'  - 0 Reassessment of Adherence to Treatment Plan ASSESSMENTS - Wound and Skin A ssessment / Reassessment '[]'  - 0 Simple Wound Assessment / Reassessment - one wound X- 3 5 Complex Wound Assessment / Reassessment - multiple wounds '[]'  - 0 Dermatologic / Skin Assessment (not related to wound area) ASSESSMENTS - Focused Assessment '[]'  - 0 Circumferential Edema Measurements - multi extremities '[]'  - 0 Nutritional Assessment / Counseling / Intervention '[]'  - 0 Lower Extremity Assessment (monofilament, tuning fork, pulses) '[]'  - 0 Peripheral Arterial Disease Assessment (using hand held doppler) ASSESSMENTS - Ostomy and/or Continence Assessment and Care '[]'  - 0 Incontinence Assessment and Management '[]'  - 0 Ostomy Care Assessment and Management (repouching, etc.) PROCESS - Coordination of Care '[]'  - 0 Simple Patient / Family Education for ongoing care X- 1 20 Complex (extensive) Patient / Family Education for ongoing care X- 1 10 Staff obtains Programmer, systems, Records, T Results / Process Orders est X- 1 10 Staff telephones HHA, Nursing Homes / Clarify orders / etc '[]'  - 0 Routine Transfer to another Facility (non-emergent condition) '[]'  - 0 Routine Hospital Admission (non-emergent condition) '[]'  - 0 New Admissions / Biomedical engineer / Ordering NPWT Apligraf, etc. , '[]'  - 0 Emergency Hospital Admission (emergent condition) '[]'  - 0 Simple Discharge Coordination '[]'  - 0 Complex (extensive) Discharge Coordination PROCESS - Special Needs '[]'  - 0 Pediatric / Minor Patient Management '[]'  - 0 Isolation Patient Management '[]'  - 0 Hearing / Language / Visual special needs '[]'  - 0 Assessment of Community assistance (transportation, D/C planning, etc.) '[]'  - 0 Additional assistance / Altered mentation '[]'  - 0 Support Surface(s)  Assessment (bed, cushion, seat, etc.) INTERVENTIONS - Wound Cleansing / Measurement '[]'  - 0 Simple Wound Cleansing - one wound X- 3 5 Complex Wound Cleansing - multiple wounds X- 1 5 Wound Imaging (  photographs - any number of wounds) '[]'  - 0 Wound Tracing (instead of photographs) '[]'  - 0 Simple Wound Measurement - one wound X- 3 5 Complex Wound Measurement - multiple wounds INTERVENTIONS - Wound Dressings '[]'  - 0 Small Wound Dressing one or multiple wounds X- 3 15 Medium Wound Dressing one or multiple wounds '[]'  - 0 Large Wound Dressing one or multiple wounds '[]'  - 0 Application of Medications - topical '[]'  - 0 Application of Medications - injection INTERVENTIONS - Miscellaneous '[]'  - 0 External ear exam '[]'  - 0 Specimen Collection (cultures, biopsies, blood, body fluids, etc.) '[]'  - 0 Specimen(s) / Culture(s) sent or taken to Lab for analysis '[]'  - 0 Patient Transfer (multiple staff / Civil Service fast streamer / Similar devices) '[]'  - 0 Simple Staple / Suture removal (25 or less) '[]'  - 0 Complex Staple / Suture removal (26 or more) '[]'  - 0 Hypo / Hyperglycemic Management (close monitor of Blood Glucose) '[]'  - 0 Ankle / Brachial Index (ABI) - do not check if billed separately X- 1 5 Vital Signs Has the patient been seen at the hospital within the last three years: Yes Total Score: 150 Level Of Care: New/Established - Level 4 Electronic Signature(s) Signed: 12/22/2020 5:32:23 PM By: Lorrin Jackson Entered By: Lorrin Jackson on 12/22/2020 09:54:16 -------------------------------------------------------------------------------- Encounter Discharge Information Details Patient Name: Date of Service: [name] 12/22/2020 8:45 A M Medical Record Number: [id number] Patient Account Number: [id number] Date of Birth/Sex: Treating RN: April 08, 1941 (80 y.o. [name] Primary Care Aprill Banko: Nolene Ebbs Other Clinician: Referring Savina Olshefski: Treating Amory Simonetti/Extender: Lenard Galloway in Treatment: 6 Encounter Discharge Information Items Discharge Condition: Stable Ambulatory Status: Stretcher Discharge Destination: Home Transportation: Ambulance Accompanied By: son, EMS staff Schedule Follow-up Appointment: Yes Clinical Summary of Care: Patient Declined Electronic Signature(s) Signed: 12/25/2020 4:50:33 PM By: Baruch Gouty RN, BSN Entered By: Baruch Gouty on 12/22/2020 10:17:18 -------------------------------------------------------------------------------- Lower Extremity Assessment Details Patient Name: Date of Service: [name] 12/22/2020 8:45 A M Medical Record Number: [id number] Patient Account Number: [id number] Date of Birth/Sex: Treating RN: 1940/11/07 (80 y.o. Sue Lush Primary Care Lether Tesch: Nolene Ebbs Other Clinician: Referring Raynard Mapps: Treating Natalyia Innes/Extender: Lenard Galloway in Treatment: 6 Electronic Signature(s) Signed: 12/22/2020 5:32:23 PM By: Lorrin Jackson Entered By: Lorrin Jackson on 12/22/2020 09:37:49 -------------------------------------------------------------------------------- Multi Wound Chart Details Patient Name: Date of Service: [name] 12/22/2020 8:45 A M Medical Record Number: [id number] Patient Account Number: [id number] Date of Birth/Sex: Treating RN: 1940/11/23 (80 y.o. Sue Lush Primary Care Jenesys Casseus: Nolene Ebbs Other Clinician: Referring Corinna Burkman: Treating Lennyn Gange/Extender: Lenard Galloway in Treatment: 6 Vital Signs Height(in): Pulse(bpm): 85 Weight(lbs): Blood Pressure(mmHg): 118/69 Body Mass Index(BMI): Temperature(F): 99.0 Respiratory Rate(breaths/min): 20 Photos: [3:No Photos Sacrum] [4:No Photos Left, Proximal Gluteus] [5:No Photos Left Gluteus] Wound Location: [3:Gradually Appeared] [4:Gradually Appeared] [5:Gradually Appeared] Wounding Event: [3:Pressure Ulcer] [4:Pressure  Ulcer] [5:Pressure Ulcer] Primary Etiology: [3:Hypertension, Dementia] [4:Hypertension, Dementia] [5:Hypertension, Dementia] Comorbid History: [3:09/12/2020] [4:09/12/2020] [5:09/12/2020] Date Acquired: [3:6] [4:6] [5:6] Weeks of Treatment: [3:Open] [4:Healed - Epithelialized] [5:Open] Wound Status: [3:2x0.4x0.1] [4:0x0x0] [5:1.5x1.5x1.3] Measurements L x W x D (cm) [3:0.628] [4:0] [5:1.767] A (cm) : rea [3:0.063] [4:0] [5:2.297] Volume (cm) : [3:90.00%] [4:100.00%] [5:25.00%] % Reduction in A rea: [3:98.00%] [4:100.00%] [5:2.50%] % Reduction in Volume: [5:2] Position 1 (o'clock): [5:4] Maximum Distance 1 (cm): [3:No] [4:N/A] [5:Yes] Tunneling: [3:No] [4:N/A] [5:No] Undermining: [3:Category/Stage III] [4:Category/Stage II] [5:Category/Stage II] Classification: [3:Medium] [4:N/A] [5:Large] Exudate A mount: [3:Serosanguineous] [4:N/A] [5:Purulent] Exudate Type: [  3:red, brown] [4:N/A] [5:yellow, brown, green] Exudate Color: [3:Distinct, outline attached] [4:N/A] [5:Well defined, not attached] Wound Margin: [3:Large (67-100%)] [4:N/A] [5:Large (67-100%)] Granulation A mount: [3:Red, Pink] [4:N/A] [5:Red] Granulation Quality: [3:Small (1-33%)] [4:N/A] [5:None Present (0%)] Necrotic A mount: [3:Fat Layer (Subcutaneous Tissue): Yes N/A] [5:Fat Layer (Subcutaneous Tissue): Yes] Exposed Structures: [3:Fascia: No Tendon: No Muscle: No Joint: No Bone: No Small (1-33%)] [4:N/A] [5:Fascia: No Tendon: No Muscle: No Joint: No Bone: No None] Wound Number: 6 N/A N/A Photos: No Photos N/A N/A Midline Back N/A N/A Wound Location: Surgical Injury N/A N/A Wounding Event: Pressure Ulcer N/A N/A Primary Etiology: Hypertension, Dementia N/A N/A Comorbid History: 09/12/2020 N/A N/A Date Acquired: 6 N/A N/A Weeks of Treatment: Open N/A N/A Wound Status: 5.5x6x1 N/A N/A Measurements L x W x D (cm) 25.918 N/A N/A A (cm) : rea 25.918 N/A N/A Volume (cm) : -46.70% N/A N/A % Reduction in A  rea: -46.70% N/A N/A % Reduction in Volume: 12 Starting Position 1 (o'clock): 6 Ending Position 1 (o'clock): 4.2 Maximum Distance 1 (cm): N/A N/A N/A Tunneling: Yes N/A N/A Undermining: Category/Stage III N/A N/A Classification: Large N/A N/A Exudate A mount: Purulent N/A N/A Exudate Type: yellow, brown, green N/A N/A Exudate Color: Well defined, not attached N/A N/A Wound Margin: Large (67-100%) N/A N/A Granulation A mount: Red, Pink N/A N/A Granulation Quality: Small (1-33%) N/A N/A Necrotic A mount: Fat Layer (Subcutaneous Tissue): Yes N/A N/A Exposed Structures: Fascia: No Tendon: No Muscle: No Joint: No Bone: No None N/A N/A Epithelialization: Treatment Notes Electronic Signature(s) Signed: 12/22/2020 5:00:10 PM By: Linton Ham MD Signed: 12/22/2020 5:32:23 PM By: Lorrin Jackson Entered By: Linton Ham on 12/22/2020 09:59:27 -------------------------------------------------------------------------------- Multi-Disciplinary Care Plan Details Patient Name: Date of Service: [name] 12/22/2020 8:45 A M Medical Record Number: [id number] Patient Account Number: [id number] Date of Birth/Sex: Treating RN: 09-28-1940 (81 y.o. Sue Lush Primary Care Dyke Weible: Nolene Ebbs Other Clinician: Referring Dilyn Osoria: Treating Niklaus Mamaril/Extender: Lenard Galloway in Treatment: 6 Active Inactive Pressure Nursing Diagnoses: Knowledge deficit related to causes and risk factors for pressure ulcer development Goals: Patient will remain free from development of additional pressure ulcers Date Initiated: 11/10/2020 Target Resolution Date: 01/19/2021 Goal Status: Active Interventions: Assess: immobility, friction, shearing, incontinence upon admission and as needed Assess offloading mechanisms upon admission and as needed Assess potential for pressure ulcer upon admission and as needed Provide education on pressure  ulcers Treatment Activities: Patient referred for home evaluation of offloading devices/mattresses : 11/10/2020 Notes: 12/22/20 Pressure relief ongoing issue. Wound/Skin Impairment Nursing Diagnoses: Impaired tissue integrity Goals: Patient/caregiver will verbalize understanding of skin care regimen Date Initiated: 11/10/2020 Date Inactivated: 12/08/2020 Target Resolution Date: 12/08/2020 Goal Status: Met Ulcer/skin breakdown will have a volume reduction of 30% by week 4 Date Initiated: 11/10/2020 Date Inactivated: 12/22/2020 Target Resolution Date: 12/08/2020 Goal Status: Met Ulcer/skin breakdown will have a volume reduction of 50% by week 8 Date Initiated: 12/22/2020 Target Resolution Date: 01/12/2021 Goal Status: Active Interventions: Assess patient/caregiver ability to obtain necessary supplies Assess patient/caregiver ability to perform ulcer/skin care regimen upon admission and as needed Assess ulceration(s) every visit Provide education on ulcer and skin care Treatment Activities: Skin care regimen initiated : 11/10/2020 Topical wound management initiated : 11/10/2020 Notes: Electronic Signature(s) Signed: 12/22/2020 8:39:48 AM By: Lorrin Jackson Previous Signature: 12/22/2020 8:38:44 AM Version By: Lorrin Jackson Entered By: Lorrin Jackson on 12/22/2020 08:39:48 -------------------------------------------------------------------------------- Pain Assessment Details Patient Name: Date of Service: [name] 12/22/2020 8:45 A M  Medical Record Number: [id number] Patient Account Number: [id number] Date of Birth/Sex: Treating RN: 09-13-1940 (80 y.o. Sue Lush Primary Care Vernia Teem: Nolene Ebbs Other Clinician: Referring Alexzandrea Normington: Treating Tira Lafferty/Extender: Lenard Galloway in Treatment: 6 Active Problems Location of Pain Severity and Description of Pain Patient Has Paino Yes Site Locations Rate the pain. Current Pain Level: 6 Pain  Management and Medication Current Pain Management: Electronic Signature(s) Signed: 12/22/2020 11:30:48 AM By: Sandre Kitty Signed: 12/22/2020 5:32:23 PM By: Lorrin Jackson Entered By: Sandre Kitty on 12/22/2020 09:27:00 -------------------------------------------------------------------------------- Patient/Caregiver Education Details Patient Name: Date of Service: [name] 5/13/2022andnbsp8:45 [place] Record Number: [id number] Patient Account Number: [id number] Date of Birth/Gender: Treating RN: 1941-08-10 (80 y.o. Sue Lush Primary Care Physician: Nolene Ebbs Other Clinician: Referring Physician: Treating Physician/Extender: Lenard Galloway in Treatment: 6 Education Assessment Education Provided To: Patient Education Topics Provided Pressure: Methods: Explain/Verbal, Printed Responses: State content correctly Wound/Skin Impairment: Methods: Explain/Verbal, Printed Responses: State content correctly Electronic Signature(s) Signed: 12/22/2020 5:32:23 PM By: Lorrin Jackson Entered By: Lorrin Jackson on 12/22/2020 09:29:21 -------------------------------------------------------------------------------- Wound Assessment Details Patient Name: Date of Service: [name] 12/22/2020 8:45 A M Medical Record Number: [id number] Patient Account Number: [id number] Date of Birth/Sex: Treating RN: May 23, 1941 (80 y.o. Sue Lush Primary Care Ladawna Walgren: Nolene Ebbs Other Clinician: Referring Patrisha Hausmann: Treating Warden Buffa/Extender: Lenard Galloway in Treatment: 6 Wound Status Wound Number: 3 Primary Etiology: Pressure Ulcer Wound Location: Sacrum Wound Status: Open Wounding Event: Gradually Appeared Comorbid History: Hypertension, Dementia Date Acquired: 09/12/2020 Weeks Of Treatment: 6 Clustered Wound: No Photos Wound Measurements Length: (cm) 2 Width: (cm) 0.4 Depth: (cm) 0.1 Area:  (cm) 0.628 Volume: (cm) 0.063 % Reduction in Area: 90% % Reduction in Volume: 98% Epithelialization: Small (1-33%) Tunneling: No Undermining: No Wound Description Classification: Category/Stage III Wound Margin: Distinct, outline attached Exudate Amount: Medium Exudate Type: Serosanguineous Exudate Color: red, brown Wound Bed Granulation Amount: Large (67-100%) Granulation Quality: Red, Pink Necrotic Amount: Small (1-33%) Necrotic Quality: Adherent Slough Foul Odor After Cleansing: No Slough/Fibrino Yes Exposed Structure Fascia Exposed: No Fat Layer (Subcutaneous Tissue) Exposed: Yes Tendon Exposed: No Muscle Exposed: No Joint Exposed: No Bone Exposed: No Treatment Notes Wound #3 (Sacrum) Cleanser Soap and Water Discharge Instruction: May shower and wash wound with dial antibacterial soap and water prior to dressing change. Peri-Wound Care Topical Primary Dressing Promogran Prisma Matrix, 4.34 (sq in) (silver collagen) Discharge Instruction: Moisten with saline or hydrogel Secondary Dressing Woven Gauze Sponge, Non-Sterile 4x4 in Discharge Instruction: Apply saline moist over primary then dry gauze ComfortFoam Border, 4x4 in (silicone border) Discharge Instruction: Apply over primary dressing as directed. Secured With Compression Wrap Compression Stockings Environmental education officer) Signed: 12/22/2020 4:52:32 PM By: Sandre Kitty Signed: 12/22/2020 5:32:23 PM By: Lorrin Jackson Entered By: Sandre Kitty on 12/22/2020 16:31:32 -------------------------------------------------------------------------------- Wound Assessment Details Patient Name: Date of Service: [name] 12/22/2020 8:45 A M Medical Record Number: [id number] Patient Account Number: [id number] Date of Birth/Sex: Treating RN: 07/30/1941 (80 y.o. Sue Lush Primary Care Marionna Gonia: Nolene Ebbs Other Clinician: Referring [name]: Treating Nikeia Henkes/Extender: Lenard Galloway in Treatment: 6 Wound Status Wound Number: 4 Primary Etiology: Pressure Ulcer Wound Location: Left, Proximal Gluteus Wound Status: Healed - Epithelialized Wounding Event: Gradually Appeared Comorbid History: Hypertension, Dementia Date Acquired: 09/12/2020 Weeks Of Treatment: 6 Clustered Wound: No Wound Measurements Length: (cm) Width: (cm) Depth: (cm) Area: (cm) Volume: (cm) Wound Description Classification: Category/Stage II 0 % Reduction in Area:  100% 0 % Reduction in Volume: 100% 0 0 0 Electronic Signature(s) Signed: 12/22/2020 5:32:23 PM By: Lorrin Jackson Entered By: Lorrin Jackson on 12/22/2020 09:41:30 -------------------------------------------------------------------------------- Wound Assessment Details Patient Name: Date of Service: [name] 12/22/2020 8:45 A M Medical Record Number: [id number] Patient Account Number: [id number] Date of Birth/Sex: Treating RN: [birth year] (81 y.o. Sue Lush Primary Care Gerardo Caiazzo: Nolene Ebbs Other Clinician: Referring Karizma Cheek: Treating Billi Bright/Extender: Lenard Galloway in Treatment: 6 Wound Status Wound Number: 5 Primary Etiology: Pressure Ulcer Wound Location: Left Gluteus Wound Status: Open Wounding Event: Gradually Appeared Comorbid History: Hypertension, Dementia Date Acquired: 09/12/2020 Weeks Of Treatment: 6 Clustered Wound: No Photos Wound Measurements Length: (cm) 1.5 Width: (cm) 1.5 Depth: (cm) 1.3 Area: (cm) 1.767 Volume: (cm) 2.297 % Reduction in Area: 25% % Reduction in Volume: 2.5% Epithelialization: None Tunneling: Yes Position (o'clock): 2 Maximum Distance: (cm) 4 Undermining: No Wound Description Classification: Category/Stage II Wound Margin: Well defined, not attached Exudate Amount: Large Exudate Type: Purulent Exudate Color: yellow, brown, green Foul Odor After Cleansing: No Slough/Fibrino No Wound  Bed Granulation Amount: Large (67-100%) Exposed Structure Granulation Quality: Red Fascia Exposed: No Necrotic Amount: None Present (0%) Fat Layer (Subcutaneous Tissue) Exposed: Yes Tendon Exposed: No Muscle Exposed: No Joint Exposed: No Bone Exposed: No Treatment Notes Wound #5 (Gluteus) Wound Laterality: Left Cleanser Peri-Wound Care Topical Primary Dressing Promogran Prisma Matrix, 4.34 (sq in) (silver collagen) Discharge Instruction: Moisten with saline or hydrogel-pack into undermining Secondary Dressing Woven Gauze Sponge, Non-Sterile 4x4 in Discharge Instruction: Apply saline moist over primary and undermined area then dry gauze ComfortFoam Border, 4x4 in (silicone border) Discharge Instruction: Apply over primary dressing as directed. Secured With Compression Wrap Compression Stockings Environmental education officer) Signed: 12/22/2020 4:52:32 PM By: Sandre Kitty Signed: 12/22/2020 5:32:23 PM By: Lorrin Jackson Entered By: Sandre Kitty on 12/22/2020 16:31:48 -------------------------------------------------------------------------------- Wound Assessment Details Patient Name: Date of Service: [name] 12/22/2020 8:45 A M Medical Record Number: [id number] Patient Account Number: [id number] Date of Birth/Sex: Treating RN: April 20, 1941 (80 y.o. Sue Lush Primary Care Amos Gaber: Nolene Ebbs Other Clinician: Referring Leor Whyte: Treating Darvin Dials/Extender: Lenard Galloway in Treatment: 6 Wound Status Wound Number: 6 Primary Etiology: Pressure Ulcer Wound Location: Midline Back Wound Status: Open Wounding Event: Surgical Injury Comorbid History: Hypertension, Dementia Date Acquired: 09/12/2020 Weeks Of Treatment: 6 Clustered Wound: No Photos Wound Measurements Length: (cm) 5.5 Width: (cm) 6 Depth: (cm) 1 Area: (cm) 25.918 Volume: (cm) 25.918 % Reduction in Area: -46.7% % Reduction in Volume:  -46.7% Epithelialization: None Undermining: Yes Starting Position (o'clock): 12 Ending Position (o'clock): 6 Maximum Distance: (cm) 4.2 Wound Description Classification: Category/Stage III Wound Margin: Well defined, not attached Exudate Amount: Large Exudate Type: Purulent Exudate Color: yellow, brown, green Foul Odor After Cleansing: No Slough/Fibrino Yes Wound Bed Granulation Amount: Large (67-100%) Exposed Structure Granulation Quality: Red, Pink Fascia Exposed: No Necrotic Amount: Small (1-33%) Fat Layer (Subcutaneous Tissue) Exposed: Yes Necrotic Quality: Adherent Slough Tendon Exposed: No Muscle Exposed: No Joint Exposed: No Bone Exposed: No Treatment Notes Wound #6 (Back) Wound Laterality: Midline Cleanser Soap and Water Discharge Instruction: May shower and wash wound with dial antibacterial soap and water prior to dressing change. Peri-Wound Care Topical Primary Dressing Promogran Prisma Matrix, 4.34 (sq in) (silver collagen) Discharge Instruction: Moisten with saline or hydrogel-pack into undermining Secondary Dressing Woven Gauze Sponge, Non-Sterile 4x4 in Discharge Instruction: Apply saline moist over primary and undermined area then dry gauze ComfortFoam Border, 4x4 in (silicone border) Discharge  Instruction: Apply over primary dressing as directed. Secured With Compression Wrap Compression Stockings Environmental education officer) Signed: 12/22/2020 4:52:32 PM By: Sandre Kitty Signed: 12/22/2020 5:32:23 PM By: Lorrin Jackson Entered By: Sandre Kitty on 12/22/2020 16:31:15 -------------------------------------------------------------------------------- Vitals Details Patient Name: Date of Service: [name] 12/22/2020 8:45 A M Medical Record Number: [id number] Patient Account Number: [id number] Date of Birth/Sex: Treating RN: 14-Oct-1940 (80 y.o. Sue Lush Primary Care Daimon Kean: Nolene Ebbs Other Clinician: Referring  Janete Quilling: Treating Staria Birkhead/Extender: Lenard Galloway in Treatment: 6 Vital Signs Time Taken: 09:26 Temperature (F): 99.0 Pulse (bpm): 85 Respiratory Rate (breaths/min): 20 Blood Pressure (mmHg): 118/69 Reference Range: 80 - 120 mg / dl Electronic Signature(s) Signed: 12/22/2020 11:30:48 AM By: Sandre Kitty Entered By: Sandre Kitty on 12/22/2020 09:26:49

## 2021-01-05 ENCOUNTER — Encounter (HOSPITAL_BASED_OUTPATIENT_CLINIC_OR_DEPARTMENT_OTHER): Payer: Medicare Other | Admitting: Internal Medicine

## 2021-01-05 ENCOUNTER — Other Ambulatory Visit: Payer: Self-pay

## 2021-01-05 DIAGNOSIS — L89153 Pressure ulcer of sacral region, stage 3: Secondary | ICD-10-CM | POA: Diagnosis not present

## 2021-01-05 NOTE — Progress Notes (Addendum)
Domingo, NAHOMI HEGNER ([id number]) Visit Report for 01/05/2021 Arrival Information Details Patient Name: Date of Service: IESHA, [place] 01/05/2021 8:45 A M Medical Record Number: [id number] Patient Account Number: [id number] Date of Birth/Sex: Treating RN: 08-19-1940 (80 y.o. Helene Shoe, Tammi Klippel Primary Care Kenlei Safi: Nolene Ebbs Other Clinician: Referring Durga Saldarriaga: Treating Devaeh Amadi/Extender: Marin Olp in Treatment: 8 Visit Information History Since Last Visit Added or deleted any medications: No Patient Arrived: Stretcher Any new allergies or adverse reactions: No Arrival Time: 08:38 Had a fall or experienced change in No Accompanied By: EMS activities of daily living that may affect Transfer Assistance: None risk of falls: Patient Identification Verified: Yes Signs or symptoms of abuse/neglect since last visito No Secondary Verification Process Completed: Yes Hospitalized since last visit: No Patient Requires Transmission-Based Precautions: No Implantable device outside of the clinic excluding No Patient Has Alerts: No cellular tissue based products placed in the center since last visit: Has Dressing in Place as Prescribed: Yes Pain Present Now: No Electronic Signature(s) Signed: 01/05/2021 1:38:01 PM By: Deon Pilling Entered By: Deon Pilling on 01/05/2021 08:44:30 -------------------------------------------------------------------------------- Encounter Discharge Information Details Patient Name: Date of Service: [name] 01/05/2021 8:45 A M Medical Record Number: [id number] Patient Account Number: [id number] Date of Birth/Sex: Treating RN: November 28, 1940 (80 y.o. Debby Bud Primary Care Kyal Arts: Nolene Ebbs Other Clinician: Referring Sharyn Brilliant: Treating Rashiya Lofland/Extender: Marin Olp in Treatment: 8 Encounter Discharge Information Items Discharge Condition: Stable Ambulatory Status:  Stretcher Discharge Destination: Home Transportation: Private Auto Accompanied By: EMS and son Schedule Follow-up Appointment: Yes Clinical Summary of Care: Electronic Signature(s) Signed: 01/05/2021 1:38:01 PM By: Deon Pilling Entered By: Deon Pilling on 01/05/2021 13:30:38 -------------------------------------------------------------------------------- Lower Extremity Assessment Details Patient Name: Date of Service: JAMEELAH, WATTS 01/05/2021 8:45 A M Medical Record Number: [id number] Patient Account Number: [id number] Date of Birth/Sex: Treating RN: Feb 27, 1941 (80 y.o. Debby Bud Primary Care Erilyn Pearman: Nolene Ebbs Other Clinician: Referring Hassen Bruun: Treating Darolyn Double/Extender: Marin Olp in Treatment: 8 Electronic Signature(s) Signed: 01/05/2021 1:38:01 PM By: Deon Pilling Entered By: Deon Pilling on 01/05/2021 08:45:12 -------------------------------------------------------------------------------- Multi Wound Chart Details Patient Name: Date of Service: [name] 01/05/2021 8:45 A M Medical Record Number: [id number] Patient Account Number: [id number] Date of Birth/Sex: Treating RN: [birth year] (80 y.o. Sue Lush Primary Care Md Smola: Nolene Ebbs Other Clinician: Referring Norvella Loscalzo: Treating Shantanu Strauch/Extender: Marin Olp in Treatment: 8 Vital Signs Height(in): Pulse(bpm): 13 Weight(lbs): Blood Pressure(mmHg): 107/67 Body Mass Index(BMI): Temperature(F): 98.2 Respiratory Rate(breaths/min): 16 Photos: [3:No Photos Sacrum] [5:No Photos Left Gluteus] [6:No Photos Midline Back] Wound Location: [3:Gradually Appeared] [5:Gradually Appeared] [6:Surgical Injury] Wounding Event: [3:Pressure Ulcer] [5:Pressure Ulcer] [6:Pressure Ulcer] Primary Etiology: [3:Hypertension, Dementia] [5:Hypertension, Dementia] [6:Hypertension, Dementia] Comorbid History: [3:09/12/2020] [5:09/12/2020]  [6:09/12/2020] Date Acquired: [3:8] [5:8] [6:8] Weeks of Treatment: [3:Open] [5:Open] [6:Open] Wound Status: [3:0.6x0.8x0.1] [5:0.9x1.5x1.7] [6:5x5.2x0.6] Measurements L x W x D (cm) [3:0.377] [5:1.06] [6:20.42] A (cm) : rea [3:0.038] [5:1.802] [6:12.252] Volume (cm) : [3:94.00%] [5:55.00%] [6:-15.60%] % Reduction in A rea: [3:98.80%] [5:23.50%] [6:30.70%] % Reduction in Volume: [5:3] Position 1 (o'clock): [5:6.5] Maximum Distance 1 (cm): [6:12] Starting Position 1 (o'clock): [6:6] Ending Position 1 (o'clock): [6:3] Maximum Distance 1 (cm): [3:No] [5:Yes] [6:No] Tunneling: [3:No] [5:No] [6:Yes] Undermining: [3:Category/Stage III] [5:Category/Stage II] [6:Category/Stage III] Classification: [3:Medium] [5:Large] [6:Large] Exudate A mount: [3:Serosanguineous] [5:Purulent] [6:Purulent] Exudate Type: [3:red, brown] [5:yellow, brown, green] [6:yellow, brown, green] Exudate Color: [3:Distinct, outline attached] [5:Well defined, not attached] [6:Well defined, not attached] Wound Margin: [3:Medium (34-66%)] [  5:Large (67-100%)] [6:Large (67-100%)] Granulation A mount: [3:Red, Pink] [5:Red, Friable] [6:Red, Pink, Friable] Granulation Quality: [3:Medium (34-66%)] [5:None Present (0%)] [6:None Present (0%)] Necrotic A mount: [3:Fat Layer (Subcutaneous Tissue): Yes Fat Layer (Subcutaneous Tissue): Yes Fat Layer (Subcutaneous Tissue): Yes] Exposed Structures: [3:Fascia: No Tendon: No Muscle: No Joint: No Bone: No Large (67-100%)] [5:Fascia: No Tendon: No Muscle: No Joint: No Bone: No None] [6:Fascia: No Tendon: No Muscle: No Joint: No Bone: No None] Epithelialization: [3:N/A] [5:N/A] [6:Negative Pressure Wound Therapy] Procedures Performed: [6:Application (NPWT)] Treatment Notes Electronic Signature(s) Signed: 01/05/2021 9:47:25 AM By: Kalman Shan DO Signed: 01/05/2021 1:31:18 PM By: Lorrin Jackson Entered By: Kalman Shan on 01/05/2021  09:42:29 -------------------------------------------------------------------------------- Multi-Disciplinary Care Plan Details Patient Name: Date of Service: [name] 01/05/2021 8:45 A M Medical Record Number: [id number] Patient Account Number: [id number] Date of Birth/Sex: Treating RN: 1941/05/12 (80 y.o. Sue Lush Primary Care Preesha Benjamin: Nolene Ebbs Other Clinician: Referring Danicka Hourihan: Treating Lynne Righi/Extender: Marin Olp in Treatment: 8 Active Inactive Pressure Nursing Diagnoses: Knowledge deficit related to causes and risk factors for pressure ulcer development Goals: Patient will remain free from development of additional pressure ulcers Date Initiated: 11/10/2020 Target Resolution Date: 01/19/2021 Goal Status: Active Interventions: Assess: immobility, friction, shearing, incontinence upon admission and as needed Assess offloading mechanisms upon admission and as needed Assess potential for pressure ulcer upon admission and as needed Provide education on pressure ulcers Treatment Activities: Patient referred for home evaluation of offloading devices/mattresses : 11/10/2020 Notes: 12/22/20 Pressure relief ongoing issue. Wound/Skin Impairment Nursing Diagnoses: Impaired tissue integrity Goals: Patient/caregiver will verbalize understanding of skin care regimen Date Initiated: 11/10/2020 Date Inactivated: 12/08/2020 Target Resolution Date: 12/08/2020 Goal Status: Met Ulcer/skin breakdown will have a volume reduction of 30% by week 4 Date Initiated: 11/10/2020 Date Inactivated: 12/22/2020 Target Resolution Date: 12/08/2020 Goal Status: Met Ulcer/skin breakdown will have a volume reduction of 50% by week 8 Date Initiated: 12/22/2020 Target Resolution Date: 01/12/2021 Goal Status: Active Interventions: Assess patient/caregiver ability to obtain necessary supplies Assess patient/caregiver ability to perform ulcer/skin care regimen upon  admission and as needed Assess ulceration(s) every visit Provide education on ulcer and skin care Treatment Activities: Skin care regimen initiated : 11/10/2020 Topical wound management initiated : 11/10/2020 Notes: Electronic Signature(s) Signed: 01/05/2021 8:48:09 AM By: Lorrin Jackson Entered By: Lorrin Jackson on 01/05/2021 08:48:08 -------------------------------------------------------------------------------- Negative Pressure Wound Therapy Application (NPWT) Details Patient Name: Date of Service: TRENEE, IGOE 01/05/2021 8:45 A M Medical Record Number: [id number] Patient Account Number: [id number] Date of Birth/Sex: Treating RN: 24-Aug-1940 (80 y.o. Sue Lush Primary Care Aryahna Spagna: Nolene Ebbs Other Clinician: Referring Koralynn Greenspan: Treating Jessye Imhoff/Extender: Marin Olp in Treatment: 8 NPWT Application Performed for: Wound #6 Midline Back Performed By: Lorrin Jackson, RN Type: VAC System Coverage Size (sq cm): 26 Pressure Type: Constant Pressure Setting: 125 mmHG Drain Type: None Quantity of Sponges/Gauze Inserted: 2 Sponge/Dressing Type: Combination Combination: white foam to tunnel, black to wound bed Date Initiated: 01/05/2021 Post Procedure Diagnosis Same as Pre-procedure Electronic Signature(s) Signed: 01/05/2021 1:31:18 PM By: Lorrin Jackson Entered By: Lorrin Jackson on 01/05/2021 09:40:27 -------------------------------------------------------------------------------- Pain Assessment Details Patient Name: Date of Service: [name] 01/05/2021 8:45 A M Medical Record Number: [id number] Patient Account Number: [id number] Date of Birth/Sex: Treating RN: [birth year] (80 y.o. Debby Bud Primary Care Mahari Strahm: Nolene Ebbs Other Clinician: Referring Yariel Ferraris: Treating Kenton Fortin/Extender: Marin Olp in Treatment: 8 Active Problems Location of Pain Severity and Description of  Pain Patient Has Paino  No Site Locations Rate the pain. Rate the pain. Current Pain Level: 0 Pain Management and Medication Current Pain Management: Medication: No Cold Application: No Rest: No Massage: No Activity: No T.E.N.S.: No Heat Application: No Leg drop or elevation: No Is the Current Pain Management Adequate: Adequate How does your wound impact your activities of daily livingo Sleep: No Bathing: No Appetite: No Relationship With Others: No Bladder Continence: No Emotions: No Bowel Continence: No Work: No Toileting: No Drive: No Dressing: No Hobbies: No Electronic Signature(s) Signed: 01/05/2021 1:38:01 PM By: Deon Pilling Entered By: Deon Pilling on 01/05/2021 08:45:07 -------------------------------------------------------------------------------- Patient/Caregiver Education Details Patient Name: Date of Service: [name] 5/27/2022andnbsp8:45 A M Medical Record Number: [id number] Patient Account Number: [id number] Date of Birth/Gender: Treating RN: [birth year] (80 y.o. Sue Lush Primary Care Physician: Nolene Ebbs Other Clinician: Referring Physician: Treating Physician/Extender: Marin Olp in Treatment: 8 Education Assessment Education Provided To: Patient and Caregiver Education Topics Provided Pressure: Methods: Explain/Verbal, Printed Wound/Skin Impairment: Methods: Explain/Verbal, Printed Responses: State content correctly Electronic Signature(s) Signed: 01/05/2021 1:31:18 PM By: Lorrin Jackson Entered By: Lorrin Jackson on 01/05/2021 08:48:29 -------------------------------------------------------------------------------- Wound Assessment Details Patient Name: Date of Service: [name] 01/05/2021 8:45 A M Medical Record Number: [id number] Patient Account Number: [id number] Date of Birth/Sex: Treating RN: 1940/09/17 (80 y.o. Helene Shoe, Tammi Klippel Primary Care Aliviyah Malanga: Nolene Ebbs Other Clinician: Referring Yuval Rubens: Treating Dominque Marlin/Extender: Marin Olp in Treatment: 8 Wound Status Wound Number: 3 Primary Etiology: Pressure Ulcer Wound Location: Sacrum Wound Status: Open Wounding Event: Gradually Appeared Comorbid History: Hypertension, Dementia Date Acquired: 09/12/2020 Weeks Of Treatment: 8 Clustered Wound: No Photos Wound Measurements Length: (cm) 0.6 Width: (cm) 0.8 Depth: (cm) 0.1 Area: (cm) 0.377 Volume: (cm) 0.038 % Reduction in Area: 94% % Reduction in Volume: 98.8% Epithelialization: Large (67-100%) Tunneling: No Undermining: No Wound Description Classification: Category/Stage III Wound Margin: Distinct, outline attached Exudate Amount: Medium Exudate Type: Serosanguineous Exudate Color: red, brown Foul Odor After Cleansing: No Slough/Fibrino Yes Wound Bed Granulation Amount: Medium (34-66%) Exposed Structure Granulation Quality: Red, Pink Fascia Exposed: No Necrotic Amount: Medium (34-66%) Fat Layer (Subcutaneous Tissue) Exposed: Yes Necrotic Quality: Adherent Slough Tendon Exposed: No Muscle Exposed: No Joint Exposed: No Bone Exposed: No Treatment Notes Wound #3 (Sacrum) Cleanser Soap and Water Discharge Instruction: May shower and wash wound with dial antibacterial soap and water prior to dressing change. Peri-Wound Care Topical Primary Dressing Promogran Prisma Matrix, 4.34 (sq in) (silver collagen) Discharge Instruction: Moisten with saline or hydrogel Secondary Dressing Woven Gauze Sponge, Non-Sterile 4x4 in Discharge Instruction: Apply saline moist over primary then dry gauze ComfortFoam Border, 4x4 in (silicone border) Discharge Instruction: Apply over primary dressing as directed. Secured With Compression Wrap Compression Stockings Add-Ons Notes x1 white foam and x1 black foam bridge to right side of abdomen applied to midline back wound negative pressure continuous  196mmHg. Electronic Signature(s) Signed: 01/09/2021 5:24:49 PM By: Sandre Kitty Signed: 01/09/2021 5:34:16 PM By: Deon Pilling Previous Signature: 01/05/2021 1:38:01 PM Version By: Deon Pilling Entered By: Sandre Kitty on 01/09/2021 13:51:18 -------------------------------------------------------------------------------- Wound Assessment Details Patient Name: Date of Service: [name] 01/05/2021 8:45 A M Medical Record Number: [id number] Patient Account Number: [id number] Date of Birth/Sex: Treating RN: 1940-11-08 (80 y.o. Debby Bud Primary Care Allyiah Gartner: Nolene Ebbs Other Clinician: Referring Mylee Falin: Treating Manjot Hinks/Extender: Marin Olp in Treatment: 8 Wound Status Wound Number: 5 Primary Etiology: Pressure Ulcer Wound Location: Left Gluteus Wound Status: Open Wounding Event: Gradually  Appeared Comorbid History: Hypertension, Dementia Date Acquired: 09/12/2020 Weeks Of Treatment: 8 Clustered Wound: No Photos Wound Measurements Length: (cm) 0.9 Width: (cm) 1.5 Depth: (cm) 1.7 Area: (cm) 1.06 Volume: (cm) 1.802 % Reduction in Area: 55% % Reduction in Volume: 23.5% Epithelialization: None Tunneling: Yes Position (o'clock): 3 Maximum Distance: (cm) 6.5 Undermining: No Wound Description Classification: Category/Stage II Wound Margin: Well defined, not attached Exudate Amount: Large Exudate Type: Purulent Exudate Color: yellow, brown, green Foul Odor After Cleansing: No Slough/Fibrino No Wound Bed Granulation Amount: Large (67-100%) Exposed Structure Granulation Quality: Red, Friable Fascia Exposed: No Necrotic Amount: None Present (0%) Fat Layer (Subcutaneous Tissue) Exposed: Yes Tendon Exposed: No Muscle Exposed: No Joint Exposed: No Bone Exposed: No Treatment Notes Wound #5 (Gluteus) Wound Laterality: Left Cleanser Soap and Water Discharge Instruction: May shower and wash wound with dial  antibacterial soap and water prior to dressing change. Peri-Wound Care Topical Primary Dressing Promogran Prisma Matrix, 4.34 (sq in) (silver collagen) Discharge Instruction: Moisten with saline or hydrogel-pack into undermining Secondary Dressing Woven Gauze Sponge, Non-Sterile 4x4 in Discharge Instruction: Apply saline moist over primary and undermined area then dry gauze ComfortFoam Border, 4x4 in (silicone border) Discharge Instruction: Apply over primary dressing as directed. Secured With Compression Wrap Compression Stockings Add-Ons Notes x1 white foam and x1 black foam bridge to right side of abdomen applied to midline back wound negative pressure continuous 168mHg. Electronic Signature(s) Signed: 01/09/2021 5:24:49 PM By: DSandre KittySigned: 01/09/2021 5:34:16 PM By: DDeon PillingPrevious Signature: 01/05/2021 1:38:01 PM Version By: DDeon PillingEntered By: DSandre Kittyon 01/09/2021 13:51:55 -------------------------------------------------------------------------------- Wound Assessment Details Patient Name: Date of Service: MDalene SeltzerD. 01/05/2021 8:45 A M Medical Record Number: [id number] Account Number: [id number] of Birth/Sex: Treating RN: 106/14/1942(80y.o. FHelene Shoe BMeta.RedingPrimary Care Mikel Pyon: ANolene EbbsOther Clinician: Referring Dnaiel Voller: Treating Silus Lanzo/Extender: HMarin Olpin Treatment: 8 Wound Status Wound Number: 6 Primary Etiology: Pressure Ulcer Wound Location: Midline Back Wound Status: Open Wounding Event: Surgical Injury Comorbid History: Hypertension, Dementia Date Acquired: 09/12/2020 Weeks Of Treatment: 8 Clustered Wound: No Photos Wound Measurements Length: (cm) 5 Width: (cm) 5.2 Depth: (cm) 0.6 Area: (cm) 20.42 Volume: (cm) 12.252 % Reduction in Area: -15.6% % Reduction in Volume: 30.7% Epithelialization: None Tunneling: No Undermining: Yes Starting Position  (o'clock): 12 Ending Position (o'clock): 6 Maximum Distance: (cm) 3 Wound Description Classification: Category/Stage III Wound Margin: Well defined, not attached Exudate Amount: Large Exudate Type: Purulent Exudate Color: yellow, brown, green Foul Odor After Cleansing: No Slough/Fibrino No Wound Bed Granulation Amount: Large (67-100%) Exposed Structure Granulation Quality: Red, Pink, Friable Fascia Exposed: No Necrotic Amount: None Present (0%) Fat Layer (Subcutaneous Tissue) Exposed: Yes Tendon Exposed: No Muscle Exposed: No Joint Exposed: No Bone Exposed: No Treatment Notes Wound #6 (Back) Wound Laterality: Midline Cleanser Peri-Wound Care Topical Primary Dressing Secondary Dressing Secured With Compression Wrap Compression Stockings Add-Ons Notes x1 white foam and x1 black foam bridge to right side of abdomen applied to midline back wound negative pressure continuous 1273mg. Electronic Signature(s) Signed: 01/09/2021 5:24:49 PM By: DaSandre Kittyigned: 01/09/2021 5:34:16 PM By: DeDeon Pillingrevious Signature: 01/05/2021 1:38:01 PM Version By: DeDeon Pillingntered By: DaSandre Kittyn 01/09/2021 13:50:59 -------------------------------------------------------------------------------- Vitals Details Patient Name: Date of Service: MCDalene Seltzer. 01/05/2021 8:45 A M Medical Record Number: [id number] Account Number: [id number] [id number] Birth/Sex: Treating RN: [id number].o. F)Debby Budrimary Care Cyndy Braver: AvNolene Ebbsther Clinician: Referring Krystalle Pilkington: Treating Keene Gilkey/Extender: HoKalman Shan  Nolene Ebbs Weeks in Treatment: 8 Vital Signs Time Taken: 08:58 Temperature (F): 98.2 Pulse (bpm): 83 Respiratory Rate (breaths/min): 16 Blood Pressure (mmHg): 107/67 Reference Range: 80 - 120 mg / dl Electronic Signature(s) Signed: 01/05/2021 1:38:01 PM By: Deon Pilling Entered By: Deon Pilling on 01/05/2021 08:44:59

## 2021-01-10 NOTE — Progress Notes (Signed)
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 and Water [facility]) Every Other Day/30 Days Discharge Instructions: May shower and wash wound with dial antibacterial soap and water prior to dressing change. Prim Dressing: Promogran Prisma Matrix, 4.34 (sq in) (silver collagen) (Home Health) Every Other Day/30 Days ary Discharge Instructions: Moisten with saline or hydrogel Secondary Dressing: Woven Gauze Sponge, Non-Sterile 4x4 in [facility]) Every Other Day/30 Days Discharge Instructions: Apply saline moist over primary then dry gauze Secondary Dressing: ComfortFoam Border, 4x4 in (silicone border) [facility]) Every Other Day/30 Days Discharge Instructions: Apply over primary dressing as directed. WOUND #5: - Gluteus Wound Laterality: Left Cleanser: Soap and Water [facility]) Every Other Day/30 Days Discharge Instructions: May shower and wash wound with dial antibacterial soap and water prior to dressing change. Prim Dressing: Promogran Prisma Matrix, 4.34 (sq in) (silver collagen) (Home Health) Every Other Day/30 Days ary Discharge Instructions: Moisten with saline or hydrogel-pack into undermining Secondary Dressing: Woven Gauze Sponge, Non-Sterile 4x4 in [facility]) Every Other Day/30 Days Discharge Instructions: Apply saline moist over primary and undermined area then dry gauze Secondary Dressing: ComfortFoam Border, 4x4 in (silicone border) [facility]) Every Other Day/30 Days Discharge Instructions: Apply over primary dressing as directed. WOUND #6: - Back Wound Laterality: Midline Cleanser: Soap and Water [facility]) Every Other Day/30 Days Discharge Instructions: May shower and wash wound with dial antibacterial soap and water prior to dressing change. Prim Dressing: Promogran Prisma Matrix, 4.34 (sq in) (silver collagen) (Home Health) Every Other Day/30 Days ary Discharge Instructions: Moisten with saline or hydrogel-pack into undermining Secondary Dressing: Woven Gauze Sponge, Non-Sterile 4x4 in [facility]) Every Other Day/30 Days Discharge Instructions: Apply saline moist over primary and undermined area then dry gauze Secondary Dressing: ComfortFoam Border, 4x4 in (silicone border) [facility]) Every Other Day/30 Days Discharge Instructions: Apply over primary dressing as directed. 1. Wound VAC to the midline back wound 2. Collagen to the buttocks and coccyx wound 3. Follow-up with Dr. Leanord Hawking in 2 weeks Electronic Signature(s) Signed: 01/05/2021 9:47:25 AM By: Geralyn Corwin DO Entered By: Geralyn Corwin on 01/05/2021 09:46:35 -------------------------------------------------------------------------------- HxROS Details Patient Name: Date of Service: Sarah Hose D. 01/05/2021 8:45 A M Medical Record Number: [id number] Patient Account Number: [id number] Date of Birth/Sex: Treating RN: 17-Sep-1940 (80 y.o. [name] Primary Care Provider: Fleet [name] Other Clinician: Referring Provider: Treating Provider/Extender: [name] in Treatment: 8 Unable to Obtain Patient History due to Dementia Information Obtained From Patient Eyes Medical History: Negative for: Cataracts;  Glaucoma; Optic Neuritis Ear/Nose/Mouth/Throat Medical History: Negative for: Chronic sinus problems/congestion; Middle ear problems Hematologic/Lymphatic Medical History: Negative for: Anemia; Hemophilia; Human Immunodeficiency Virus; Lymphedema; Sickle Cell Disease Respiratory Medical History: Negative for: Aspiration; Asthma; Chronic Obstructive Pulmonary Disease (COPD); Pneumothorax; Sleep Apnea; Tuberculosis Cardiovascular Medical History: Positive for: Hypertension Negative for: Angina; Arrhythmia; Congestive Heart Failure; Coronary Artery Disease; Deep Vein Thrombosis; Hypotension; Myocardial Infarction; Peripheral Arterial Disease; Peripheral Venous Disease; Phlebitis; Vasculitis Gastrointestinal Medical History: Negative for: Cirrhosis ; Colitis; Crohns; Hepatitis A; Hepatitis B; Hepatitis C Endocrine Medical History: Negative for: Type I Diabetes; Type II Diabetes Genitourinary Medical History: Negative for: End Stage Renal  Disease Immunological Medical History: Negative for: Lupus Erythematosus; Raynauds; Scleroderma Integumentary (Skin) Medical History: Negative for: History of Burn Musculoskeletal Medical History: Negative for: Gout; Rheumatoid Arthritis; Osteoarthritis; Osteomyelitis Neurologic Medical History: Positive for: Dementia Negative for: Neuropathy; Quadriplegia; Paraplegia; Seizure Disorder Oncologic Medical History: Negative for: Received Chemotherapy; Received Radiation Psychiatric Medical History: Negative for: Anorexia/bulimia; Confinement Anxiety Immunizations Pneumococcal Vaccine: Received Pneumococcal Vaccination: No Implantable Devices None Hospitalization / Surgery History Type of Hospitalization/Surgery OR debridement/ metal plate removal 10/93 Family and Social History Cancer: Yes - Siblings; Diabetes: No; Heart Disease: No; Hereditary Spherocytosis: No; Hypertension: No; Kidney Disease: No; Lung Disease: Yes - Siblings;  Seizures: No; Stroke: No; Thyroid Problems: No; Tuberculosis: No; Former smoker; Marital Status - Widowed; Alcohol Use: Never; Drug Use: No History; Caffeine Use: Daily; Financial Concerns: No; Food, Clothing or Shelter Needs: No; Support System Lacking: No; Transportation Concerns: No Electronic Signature(s) Signed: 01/05/2021 9:47:25 AM By: Geralyn Corwin DO Signed: 01/05/2021 1:31:18 PM By: Antonieta Iba Entered By: Geralyn Corwin on 01/05/2021 09:43:43 -------------------------------------------------------------------------------- SuperBill Details Patient Name: Date of Service: [name] 01/05/2021 Medical Record Number: [id number] Patient Account Number: [id number] Date of Birth/Sex: Treating RN: [birth year] (80 y.o. [name] Primary Care Provider: Fleet [name] Other Clinician: Referring Provider: Treating Provider/Extender: [name] in Treatment: 8 Diagnosis Coding ICD-10 Codes Code Description L89.103 Pressure ulcer of unspecified part of back, stage 3 L89.153 Pressure ulcer of sacral region, stage 3 L89.323 Pressure ulcer of left buttock, stage 3 T81.31XA Disruption of external operation (surgical) wound, not elsewhere classified, initial encounter Facility Procedures CPT4 Code: [id number] Description: [phone] - WOUND VAC-50 SQ CM OR LESS ICD-10 Diagnosis Description L89.103 Pressure ulcer of unspecified part of back, stage 3 Modifier: Quantity: 1 Physician Procedures : CPT4 Code Description Modifier [id number] - WC PHYS LEVEL 3 - EST PT ICD-10 Diagnosis Description L89.103 Pressure ulcer of unspecified part of back, stage 3 L89.153 Pressure ulcer of sacral region, stage 3 L89.323 Pressure ulcer of left buttock,  stage 3 T81.31XA Disruption of external operation (surgical) wound, not elsewhere classified, initial encounter Quantity: 1 Electronic Signature(s) Signed: 01/05/2021 9:47:25 AM By: Geralyn Corwin DO Entered  By: Geralyn Corwin on 01/05/2021 09:47:00

## 2021-01-19 ENCOUNTER — Encounter (HOSPITAL_BASED_OUTPATIENT_CLINIC_OR_DEPARTMENT_OTHER): Payer: Medicare Other | Admitting: Internal Medicine

## 2021-01-26 ENCOUNTER — Encounter (HOSPITAL_BASED_OUTPATIENT_CLINIC_OR_DEPARTMENT_OTHER): Payer: Medicare Other | Attending: Internal Medicine | Admitting: Internal Medicine

## 2021-01-26 ENCOUNTER — Other Ambulatory Visit: Payer: Self-pay

## 2021-01-26 DIAGNOSIS — L89323 Pressure ulcer of left buttock, stage 3: Secondary | ICD-10-CM | POA: Diagnosis present

## 2021-01-26 DIAGNOSIS — I428 Other cardiomyopathies: Secondary | ICD-10-CM | POA: Diagnosis not present

## 2021-01-26 DIAGNOSIS — I10 Essential (primary) hypertension: Secondary | ICD-10-CM | POA: Diagnosis not present

## 2021-01-26 DIAGNOSIS — Z86718 Personal history of other venous thrombosis and embolism: Secondary | ICD-10-CM | POA: Insufficient documentation

## 2021-01-26 DIAGNOSIS — Z7901 Long term (current) use of anticoagulants: Secondary | ICD-10-CM | POA: Diagnosis not present

## 2021-01-26 NOTE — Progress Notes (Addendum)
Torok, LYNA LANINGHAM ([id number]) Visit Report for 01/26/2021 Arrival Information Details Patient Name: Date of Service: [name], [name] 01/26/2021 8:45 A M Medical Record Number: [id number] Patient Account Number: [id number] Date of Birth/Sex: Treating RN: 1940/09/07 (80 y.o. [name] Primary Care [name]: Nolene Ebbs Other Clinician: Referring Kache Mcclurg: Treating Nevin Kozuch/Extender: Lenard Galloway in Treatment: 11 Visit Information History Since Last Visit Added or deleted any medications: No Patient Arrived: Stretcher Any new allergies or adverse reactions: No Arrival Time: 08:39 Had a fall or experienced change in No Accompanied By: EMS activities of daily living that may affect Transfer Assistance: Stretcher risk of falls: Patient Identification Verified: Yes Signs or symptoms of abuse/neglect since last visito No Secondary Verification Process Completed: Yes Hospitalized since last visit: No Patient Requires Transmission-Based Precautions: No Implantable device outside of the clinic excluding No Patient Has Alerts: No cellular tissue based products placed in the center since last visit: Has Dressing in Place as Prescribed: Yes Pain Present Now: Yes Electronic Signature(s) Signed: 01/26/2021 5:33:29 PM By: Baruch Gouty RN, BSN Entered By: Baruch Gouty on 01/26/2021 08:40:00 -------------------------------------------------------------------------------- Clinic Level of Care Assessment Details Patient Name: Date of Service: [name] 01/26/2021 8:45 A M Medical Record Number: [id number] Patient Account Number: [id number] Date of Birth/Sex: Treating RN: Mar 30, 1941 (80 y.o. Sue Lush Primary Care Loyal Holzheimer: Nolene Ebbs Other Clinician: Referring Jameer Storie: Treating Ameliana Brashear/Extender: Lenard Galloway in Treatment: 11 Clinic Level of Care Assessment Items TOOL 4 Quantity Score X- 1 0 Use when  only an EandM is performed on FOLLOW-UP visit ASSESSMENTS - Nursing Assessment / Reassessment X- 1 10 Reassessment of Co-morbidities (includes updates in patient status) X- 1 5 Reassessment of Adherence to Treatment Plan ASSESSMENTS - Wound and Skin A ssessment / Reassessment _0  - 0 Simple Wound Assessment / Reassessment - one wound X- 2 5 Complex Wound Assessment / Reassessment - multiple wounds _1  - 0 Dermatologic / Skin Assessment (not related to wound area) ASSESSMENTS - Focused Assessment _2  - 0 Circumferential Edema Measurements - multi extremities _3  - 0 Nutritional Assessment / Counseling / Intervention _4  - 0 Lower Extremity Assessment (monofilament, tuning fork, pulses) _5  - 0 Peripheral Arterial Disease Assessment (using hand held doppler) ASSESSMENTS - Ostomy and/or Continence Assessment and Care _6  - 0 Incontinence Assessment and Management _7  - 0 Ostomy Care Assessment and Management (repouching, etc.) PROCESS - Coordination of Care _8  - 0 Simple Patient / Family Education for ongoing care X- 1 20 Complex (extensive) Patient / Family Education for ongoing care X- 1 10 Staff obtains Programmer, systems, Records, T Results / Process Orders est X- 1 10 Staff telephones HHA, Nursing Homes / Clarify orders / etc _9  - 0 Routine Transfer to another Facility (non-emergent condition) _10  - 0 Routine Hospital Admission (non-emergent condition) _11  - 0 New Admissions / Biomedical engineer / Ordering NPWT Apligraf, etc. , _12  - 0 Emergency Hospital Admission (emergent condition) _13  - 0 Simple Discharge Coordination _14  - 0 Complex (extensive) Discharge Coordination PROCESS - Special Needs _15  - 0 Pediatric / Minor Patient Management _16  - 0 Isolation Patient Management _17  - 0 Hearing / Language / Visual special needs _18  - 0 Assessment of Community assistance (transportation, D/C planning, etc.) _19  - 0 Additional assistance / Altered mentation _20  - 0 Support  Surface(s) Assessment (bed, cushion, seat, etc.) INTERVENTIONS - Wound Cleansing / Measurement _21  - 0 Simple Wound Cleansing - one wound X- 2 5 Complex Wound Cleansing - multiple wounds X- 1 5  Wound Imaging (photographs - any number of wounds) _0  - 0 Wound Tracing (instead of photographs) _1  - 0 Simple Wound Measurement - one wound X- 2 5 Complex Wound Measurement - multiple wounds INTERVENTIONS - Wound Dressings _2  - 0 Small Wound Dressing one or multiple wounds X- 2 15 Medium Wound Dressing one or multiple wounds _3  - 0 Large Wound Dressing one or multiple wounds <GEXBMWUXLKGMWNUU>_7<\/OZDGUYQIHKVQQVZD>_6  - 0 Application of Medications - topical <LOVFIEPPIRJJOACZ>_6<\/SAYTKZSWFUXNATFT>_7  - 0 Application of Medications - injection INTERVENTIONS - Miscellaneous _6  - 0 External ear exam _7  - 0 Specimen Collection (cultures, biopsies, blood, body fluids, etc.) _8  - 0 Specimen(s) / Culture(s) sent or taken to Lab for analysis _9  - 0 Patient Transfer (multiple staff / Civil Service fast streamer / Similar devices) _10  - 0 Simple Staple / Suture removal (25 or less) _11  - 0 Complex Staple / Suture removal (26 or more) _12  - 0 Hypo / Hyperglycemic Management (close monitor of Blood Glucose) _13  - 0 Ankle / Brachial Index (ABI) - do not check if billed separately X- 1 5 Vital Signs Has the patient been seen at the hospital within the last three years: Yes Total Score: 125 Level Of Care: New/Established - Level 4 Electronic Signature(s) Signed: 01/26/2021 5:38:00 PM By: Lorrin Jackson Entered By: Lorrin Jackson on 01/26/2021 09:23:21 -------------------------------------------------------------------------------- Encounter Discharge Information Details Patient Name: Date of Service: Sarah Seltzer D. 01/26/2021 8:45 A M Medical Record Number: [id number] Patient Account Number: [id number] Date of Birth/Sex: Treating RN: 10-04-1940 (80 y.o. Debby Bud Primary Care Samariya Rockhold: Nolene Ebbs Other Clinician: Referring Brennyn Ortlieb: Treating Jhade Berko/Extender: Lenard Galloway in Treatment: 11 Encounter Discharge Information Items Discharge Condition: Stable Ambulatory Status: Stretcher Discharge Destination: Home Transportation: Ambulance Accompanied By: EMS and son Schedule Follow-up Appointment: Yes Clinical Summary of Care: Electronic Signature(s) Signed: 01/26/2021 4:58:53 PM By: Deon Pilling Entered By: Deon Pilling on 01/26/2021 11:30:36 -------------------------------------------------------------------------------- Lower Extremity Assessment Details Patient Name: Date of Service: [name], [name] 01/26/2021 8:45 A M Medical Record Number: [id number] Patient Account Number: [id number] Date of Birth/Sex: Treating RN: Oct 09, 1940 (80 y.o. [name] Primary Care Orell Hurtado: Nolene Ebbs Other Clinician: Referring Drewey Begue: Treating Bradey Luzier/Extender: Lenard Galloway in Treatment: 11 Electronic Signature(s) Signed: 01/26/2021 5:33:29 PM By: Baruch Gouty RN, BSN Entered By: Baruch Gouty on 01/26/2021 08:41:25 -------------------------------------------------------------------------------- Multi Wound Chart Details Patient Name: Date of Service: Sarah Seltzer D. 01/26/2021 8:45 A M Medical Record Number: [id number] Patient Account Number: [id number] Date of Birth/Sex: Treating RN: 1941-01-11 (80 y.o. Sue Lush Primary Care Fynlee Rowlands: Nolene Ebbs Other Clinician: Referring Hance Caspers: Treating Marqueze Ramcharan/Extender: Lenard Galloway in Treatment: 11 Vital Signs Height(in): Pulse(bpm): 53 Weight(lbs): Blood Pressure(mmHg): 115/68 Body Mass Index(BMI): Temperature(F): 98.8 Respiratory Rate(breaths/min): 17 Photos: [3:No Photos Sacrum] [5:No Photos Left Gluteus] [6:No Photos Midline Back] Wound Location: [3:Gradually Appeared] [5:Gradually Appeared] [6:Surgical Injury] Wounding Event: [3:Pressure Ulcer] [5:Pressure Ulcer] [6:Pressure Ulcer] Primary  Etiology: [3:N/A] [5:Hypertension, Dementia] [6:Hypertension, Dementia] Comorbid History: [3:09/12/2020] [5:09/12/2020] [6:09/12/2020] Date Acquired: [3:11] [5:11] [6:11] Weeks of Treatment: [3:Healed - Epithelialized] [5:Open] [6:Open] Wound Status: [3:0x0x0] [5:1.3x1.5x0.7] [6:3.5x3.8x0.6] Measurements L x W x D (cm) [3:0] [5:1.532] [6:10.446] A (cm) : rea [3:0] [5:1.072] [6:6.267] Volume (cm) : [3:100.00%] [5:35.00%] [6:40.90%] % Reduction in A [3:rea: 100.00%] [5:54.50%] [6:64.50%] % Reduction in Volume: [5:2] [6:12] Starting Position 1 (o'clock): [5:4] [6:12] Ending Position 1 (o'clock): [5:7.8] [6:1.6] Maximum Distance 1 (cm): [3:N/A] [5:Yes] [6:Yes] Undermining: [3:Category/Stage III] [5:Category/Stage II] [6:Category/Stage III] Classification: [3:N/A] [5:Large] [6:Large] Exudate A mount: [3:N/A] [5:Purulent] [  6:Serosanguineous] Exudate Type: [3:N/A] [5:yellow, brown, green] [6:red, brown] Exudate Color: [3:N/A] [5:Well defined, not attached] [6:Well defined, not attached] Wound Margin: [3:N/A] [5:Large (67-100%)] [6:Large (67-100%)] Granulation A mount: [3:N/A] [5:Red, Friable] [6:Red, Pink, Friable] Granulation Quality: [3:N/A] [5:None Present (0%)] [6:None Present (0%)] Necrotic A mount: [3:N/A] [5:None] [6:None] Treatment Notes Electronic Signature(s) Signed: 01/26/2021 5:27:23 PM By: Linton Ham MD Signed: 01/26/2021 5:38:00 PM By: Lorrin Jackson Entered By: Linton Ham on 01/26/2021 09:21:39 -------------------------------------------------------------------------------- Multi-Disciplinary Care Plan Details Patient Name: Date of Service: Sarah Seltzer D. 01/26/2021 8:45 A M Medical Record Number: [id number] Patient Account Number: [id number] Date of Birth/Sex: Treating RN: 08/07/1941 (80 y.o. Sue Lush Primary Care Wendle Kina: Nolene Ebbs Other Clinician: Referring Aniruddh Ciavarella: Treating Kavari Parrillo/Extender: Lenard Galloway in  Treatment: 11 Active Inactive Pressure Nursing Diagnoses: Knowledge deficit related to causes and risk factors for pressure ulcer development Goals: Patient will remain free from development of additional pressure ulcers Date Initiated: 11/10/2020 Target Resolution Date: 02/23/2021 Goal Status: Active Interventions: Assess: immobility, friction, shearing, incontinence upon admission and as needed Assess offloading mechanisms upon admission and as needed Assess potential for pressure ulcer upon admission and as needed Provide education on pressure ulcers Treatment Activities: Patient referred for home evaluation of offloading devices/mattresses : 11/10/2020 Notes: 12/22/20 Pressure relief ongoing issue. 01/26/21: No new wounds, pressure relief ongoing. Wound/Skin Impairment Nursing Diagnoses: Impaired tissue integrity Goals: Patient/caregiver will verbalize understanding of skin care regimen Date Initiated: 11/10/2020 Date Inactivated: 12/08/2020 Target Resolution Date: 12/08/2020 Goal Status: Met Ulcer/skin breakdown will have a volume reduction of 30% by week 4 Date Initiated: 11/10/2020 Date Inactivated: 12/22/2020 Target Resolution Date: 12/08/2020 Goal Status: Met Ulcer/skin breakdown will have a volume reduction of 50% by week 8 Date Initiated: 12/22/2020 Date Inactivated: 01/26/2021 Target Resolution Date: 01/12/2021 Goal Status: Met Ulcer/skin breakdown will have a volume reduction of 80% by week 12 Date Initiated: 01/26/2021 Target Resolution Date: 02/23/2021 Goal Status: Active Interventions: Assess patient/caregiver ability to obtain necessary supplies Assess patient/caregiver ability to perform ulcer/skin care regimen upon admission and as needed Assess ulceration(s) every visit Provide education on ulcer and skin care Treatment Activities: Skin care regimen initiated : 11/10/2020 Topical wound management initiated : 11/10/2020 Notes: 01/26/21: Wounds greater than 50% volume  reduction, new goal initiated. Electronic Signature(s) Signed: 01/26/2021 9:06:24 AM By: Lorrin Jackson Previous Signature: 01/26/2021 9:04:53 AM Version By: Lorrin Jackson Entered By: Lorrin Jackson on 01/26/2021 09:06:24 -------------------------------------------------------------------------------- Pain Assessment Details Patient Name: Date of Service: Sarah Seltzer D. 01/26/2021 8:45 A M Medical Record Number: [id number] Patient Account Number: [id number] Date of Birth/Sex: Treating RN: 09-17-1940 (80 y.o. [name] Primary Care Derrell Milanes: Nolene Ebbs Other Clinician: Referring Andilyn Bettcher: Treating Kinslei Labine/Extender: Lenard Galloway in Treatment: 11 Active Problems Location of Pain Severity and Description of Pain Patient Has Paino Yes Site Locations Pain Location: Pain Location: Pain in Ulcers With Dressing Change: Yes Duration of the Pain. Constant / Intermittento Constant Rate the pain. Current Pain Level: 8 Worst Pain Level: 9 Least Pain Level: 6 Character of Pain Describe the Pain: Aching Pain Management and Medication Current Pain Management: Medication: Yes Other: reposition Is the Current Pain Management Adequate: Adequate How does your wound impact your activities of daily livingo Sleep: Yes Bathing: No Appetite: No Relationship With Others: No Bladder Continence: No Emotions: Yes Bowel Continence: No Hobbies: No Toileting: No Dressing: No Electronic Signature(s) Signed: 01/26/2021 5:33:29 PM By: Baruch Gouty RN, BSN Entered By: Baruch Gouty on 01/26/2021 08:41:19 -------------------------------------------------------------------------------- Patient/Caregiver Education Details Patient  Name: Date of Service: [name], [name] 6/17/2022andnbsp8:45 [place] Record Number: [id number] Patient Account Number: [id number] Date of Birth/Gender: Treating RN: 1941-02-03 (80 y.o. Sue Lush Primary Care  Physician: Nolene Ebbs Other Clinician: Referring Physician: Treating Physician/Extender: Lenard Galloway in Treatment: 11 Education Assessment Education Provided To: Patient and Caregiver Education Topics Provided Nutrition: Methods: Explain/Verbal, Printed Responses: State content correctly Pressure: Methods: Explain/Verbal, Printed Responses: State content correctly Wound/Skin Impairment: Methods: Demonstration, Explain/Verbal, Printed Responses: State content correctly Electronic Signature(s) Signed: 01/26/2021 5:38:00 PM By: Lorrin Jackson Entered By: Lorrin Jackson on 01/26/2021 09:05:18 -------------------------------------------------------------------------------- Wound Assessment Details Patient Name: Date of Service: Sarah Seltzer D. 01/26/2021 8:45 A M Medical Record Number: [id number] Patient Account Number: [id number] Date of Birth/Sex: Treating RN: 14-Apr-1941 (80 y.o. [name] Primary Care Deem Marmol: Nolene Ebbs Other Clinician: Referring Avarey Yaeger: Treating Reather Steller/Extender: Lenard Galloway in Treatment: 11 Wound Status Wound Number: 3 Primary Etiology: Pressure Ulcer Wound Location: Sacrum Wound Status: Healed - Epithelialized Wounding Event: Gradually Appeared Comorbid History: Hypertension, Dementia Date Acquired: 09/12/2020 Weeks Of Treatment: 11 Clustered Wound: No Photos Wound Measurements Length: (cm) Width: (cm) Depth: (cm) Area: (cm) Volume: (cm) 0 % Reduction in Area: 100% 0 % Reduction in Volume: 100% 0 Epithelialization: Large (67-100%) 0 0 Wound Description Classification: Category/Stage III Wound Margin: Distinct, outline attached Exudate Amount: Medium Exudate Type: Serosanguineous Exudate Color: red, brown Foul Odor After Cleansing: No Slough/Fibrino Yes Wound Bed Granulation Amount: Medium (34-66%) Exposed Structure Granulation Quality: Red, Pink Fascia  Exposed: No Necrotic Amount: Medium (34-66%) Fat Layer (Subcutaneous Tissue) Exposed: Yes Necrotic Quality: Adherent Slough Tendon Exposed: No Muscle Exposed: No Joint Exposed: No Bone Exposed: No Treatment Notes Wound #3 (Sacrum) Cleanser Peri-Wound Care Topical Primary Dressing Secondary Dressing Secured With Compression Wrap Compression Stockings Add-Ons Notes wet tot dry in clinic to wound #5 and #6 with bordered foam as secondary. Electronic Signature(s) Signed: 01/29/2021 6:32:01 PM By: Baruch Gouty RN, BSN Signed: 02/01/2021 8:38:46 AM By: Leane Call Previous Signature: 01/26/2021 5:33:29 PM Version By: Baruch Gouty RN, BSN Entered By: Leane Call on 01/29/2021 14:43:17 -------------------------------------------------------------------------------- Wound Assessment Details Patient Name: Date of Service: Sarah Seltzer D. 01/26/2021 8:45 A M Medical Record Number: [id number] Patient Account Number: [id number] Date of Birth/Sex: Treating RN: 04-Apr-1941 (80 y.o. [name] Primary Care Maymuna Detzel: Nolene Ebbs Other Clinician: Referring Lacey Wallman: Treating Hilary Pundt/Extender: Lenard Galloway in Treatment: 11 Wound Status Wound Number: 5 Primary Etiology: Pressure Ulcer Wound Location: Left Gluteus Wound Status: Open Wounding Event: Gradually Appeared Comorbid History: Hypertension, Dementia Date Acquired: 09/12/2020 Weeks Of Treatment: 11 Clustered Wound: No Photos Wound Measurements Length: (cm) 1.3 Width: (cm) 1.5 Depth: (cm) 0.7 Area: (cm) 1.532 Volume: (cm) 1.072 % Reduction in Area: 35% % Reduction in Volume: 54.5% Epithelialization: None Tunneling: No Undermining: Yes Starting Position (o'clock): 2 Ending Position (o'clock): 4 Maximum Distance: (cm) 7.8 Wound Description Classification: Category/Stage II Wound Margin: Well defined, not attached Exudate Amount: Large Exudate Type: Purulent Exudate  Color: yellow, brown, green Foul Odor After Cleansing: No Slough/Fibrino No Wound Bed Granulation Amount: Large (67-100%) Exposed Structure Granulation Quality: Red, Friable Fascia Exposed: No Necrotic Amount: None Present (0%) Fat Layer (Subcutaneous Tissue) Exposed: Yes Tendon Exposed: No Muscle Exposed: No Joint Exposed: No Bone Exposed: No Treatment Notes Wound #5 (Gluteus) Wound Laterality: Left Cleanser Peri-Wound Care Topical Primary Dressing Secondary Dressing Secured With Compression Wrap Compression Stockings Add-Ons Notes wet tot dry in clinic to wound #5 and #6 with  bordered foam as secondary. Electronic Signature(s) Signed: 01/29/2021 6:32:01 PM By: Baruch Gouty RN, BSN Signed: 02/01/2021 8:38:46 AM By: Leane Call Previous Signature: 01/26/2021 5:33:29 PM Version By: Baruch Gouty RN, BSN Entered By: Leane Call on 01/29/2021 14:43:45 -------------------------------------------------------------------------------- Wound Assessment Details Patient Name: Date of Service: Sarah Seltzer D. 01/26/2021 8:45 A M Medical Record Number: [id number] Patient Account Number: [id number] Date of Birth/Sex: Treating RN: 09-Mar-1941 (80 y.o. [name] Primary Care Gerson Fauth: Nolene Ebbs Other Clinician: Referring Chrisy Hillebrand: Treating Graceyn Fodor/Extender: Lenard Galloway in Treatment: 11 Wound Status Wound Number: 6 Primary Etiology: Pressure Ulcer Wound Location: Midline Back Wound Status: Open Wounding Event: Surgical Injury Comorbid History: Hypertension, Dementia Date Acquired: 09/12/2020 Weeks Of Treatment: 11 Clustered Wound: No Photos Wound Measurements Length: (cm) 3.5 Width: (cm) 3.8 Depth: (cm) 0.6 Area: (cm) 10.446 Volume: (cm) 6.267 % Reduction in Area: 40.9% % Reduction in Volume: 64.5% Epithelialization: None Tunneling: No Undermining: Yes Starting Position (o'clock): 12 Ending Position (o'clock):  12 Maximum Distance: (cm) 1.6 Wound Description Classification: Category/Stage III Wound Margin: Well defined, not attached Exudate Amount: Large Exudate Type: Serosanguineous Exudate Color: red, brown Foul Odor After Cleansing: No Slough/Fibrino No Wound Bed Granulation Amount: Large (67-100%) Exposed Structure Granulation Quality: Red, Pink, Friable Fascia Exposed: No Necrotic Amount: None Present (0%) Fat Layer (Subcutaneous Tissue) Exposed: Yes Tendon Exposed: No Muscle Exposed: No Joint Exposed: No Bone Exposed: No Treatment Notes Wound #6 (Back) Wound Laterality: Midline Cleanser Peri-Wound Care Topical Primary Dressing Secondary Dressing Secured With Compression Wrap Compression Stockings Add-Ons Notes wet tot dry in clinic to wound #5 and #6 with bordered foam as secondary. Electronic Signature(s) Signed: 01/29/2021 6:32:01 PM By: Baruch Gouty RN, BSN Signed: 02/01/2021 8:38:46 AM By: Leane Call Previous Signature: 01/26/2021 5:33:29 PM Version By: Baruch Gouty RN, BSN Entered By: Leane Call on 01/29/2021 14:42:49 -------------------------------------------------------------------------------- Vitals Details Patient Name: Date of Service: Sarah Seltzer D. 01/26/2021 8:45 A M Medical Record Number: [id number] Patient Account Number: [id number] Date of Birth/Sex: Treating RN: 07/12/1941 (80 y.o. [name] Primary Care Jazlyne Gauger: Nolene Ebbs Other Clinician: Referring Charmika Macdonnell: Treating Julie-Anne Torain/Extender: Lenard Galloway in Treatment: 11 Vital Signs Time Taken: 08:35 Temperature (F): 98.8 Pulse (bpm): 89 Respiratory Rate (breaths/min): 17 Blood Pressure (mmHg): 115/68 Reference Range: 80 - 120 mg / dl Electronic Signature(s) Signed: 01/26/2021 5:33:29 PM By: Baruch Gouty RN, BSN Entered By: Baruch Gouty on 01/26/2021 08:40:27
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
## 2021-02-08 ENCOUNTER — Encounter (HOSPITAL_BASED_OUTPATIENT_CLINIC_OR_DEPARTMENT_OTHER): Payer: Medicare Other | Admitting: Internal Medicine

## 2021-02-08 ENCOUNTER — Other Ambulatory Visit: Payer: Self-pay

## 2021-02-08 DIAGNOSIS — L89323 Pressure ulcer of left buttock, stage 3: Secondary | ICD-10-CM | POA: Diagnosis not present
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
## 2021-02-09 IMAGING — MR MR HEAD W/O CM
7 of 11 series · 25 of 48 positions shown · non-contrast
Comparison: 02/02/2014 brain MRI

CLINICAL DATA: Sudden onset of slurred speech and double vision

EXAM:
MRI HEAD WITHOUT CONTRAST
TECHNIQUE: Multiplanar, multiecho pulse sequences of the brain and surrounding
structures were obtained without intravenous contrast.

[Series 2: DWI · axial · 3.0mm · 0.94mm/px · z∈[-81,+59]mm · 7 of 100 slices shown (1 of 2)]
[im 1/100]
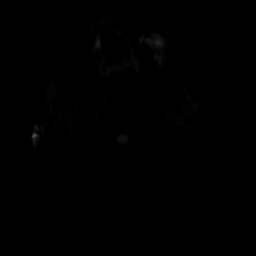
[im 17/100]
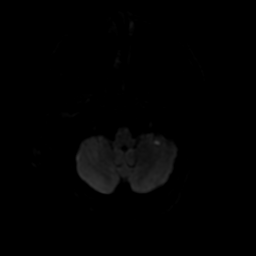
[im 34/100]
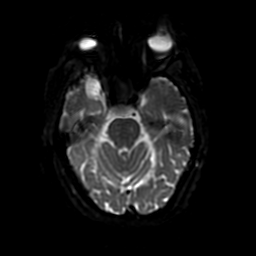
[im 50/100]
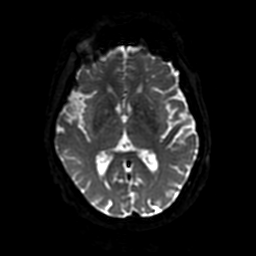
[im 67/100]
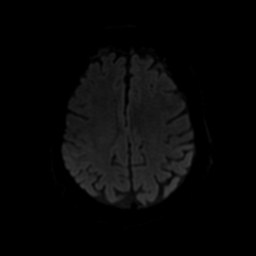
[im 83/100]
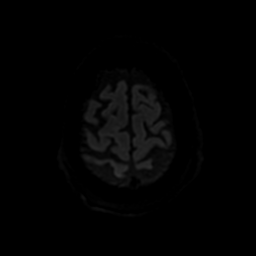
[im 100/100]
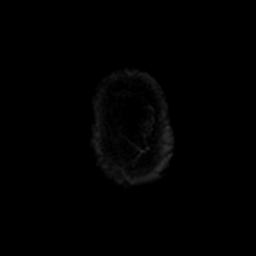

[Series 3: DWI · coronal · 4.0mm · 0.94mm/px · 6 of 76 slices shown (2 of 2)]
[im 1/76]
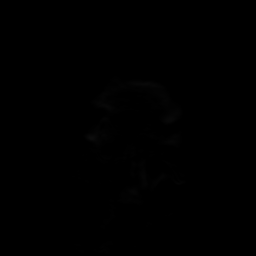
[im 16/76]
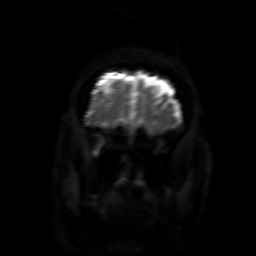
[im 31/76]
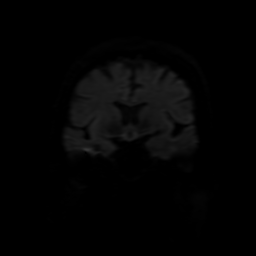
[im 46/76]
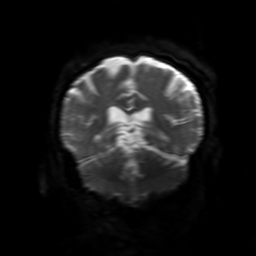
[im 61/76]
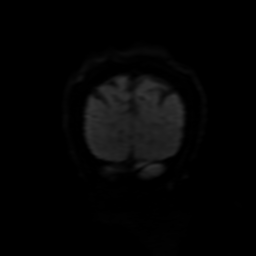
[im 76/76]
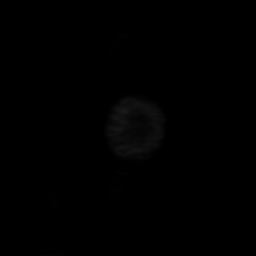

[Series 4: FLAIR · sagittal · 5.0mm · 0.23mm/px · 2 of 26 slices shown (1 of 2)]
[im 1/26]
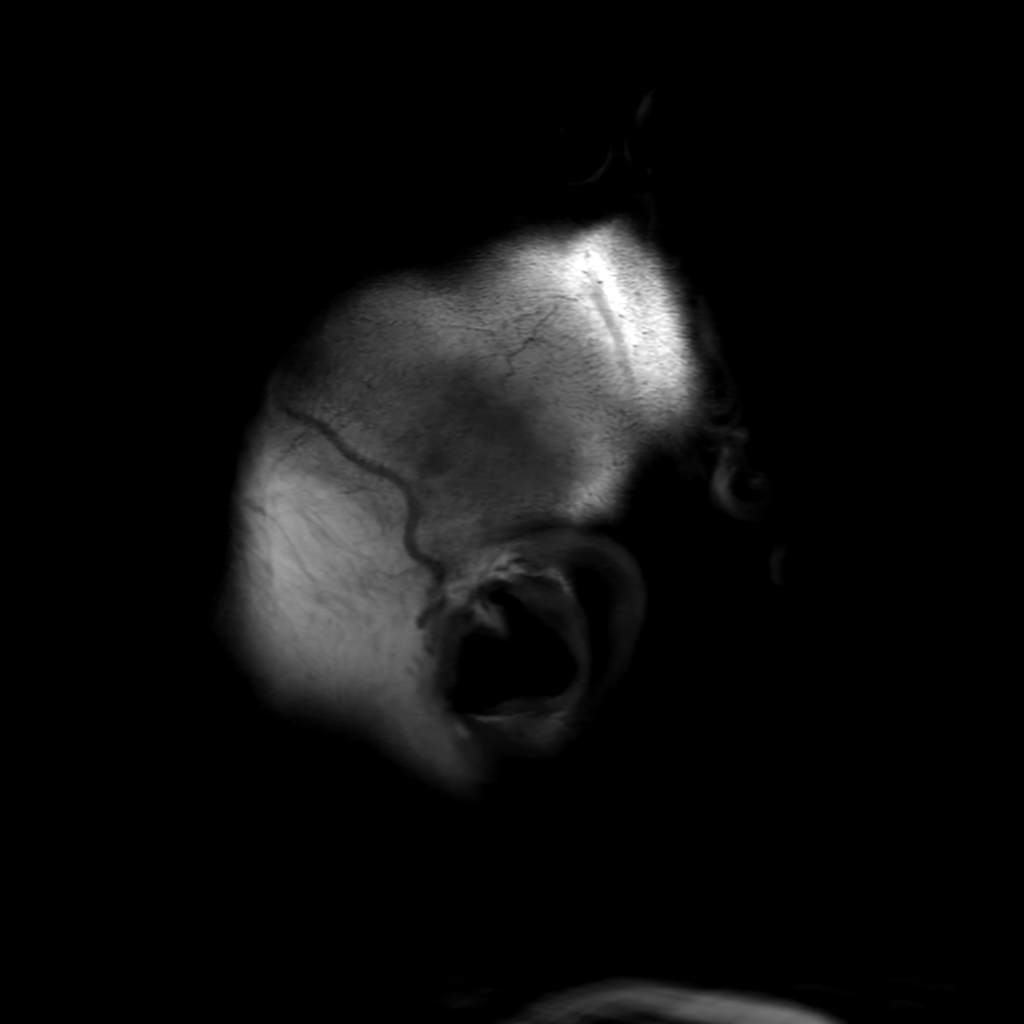
[im 26/26]
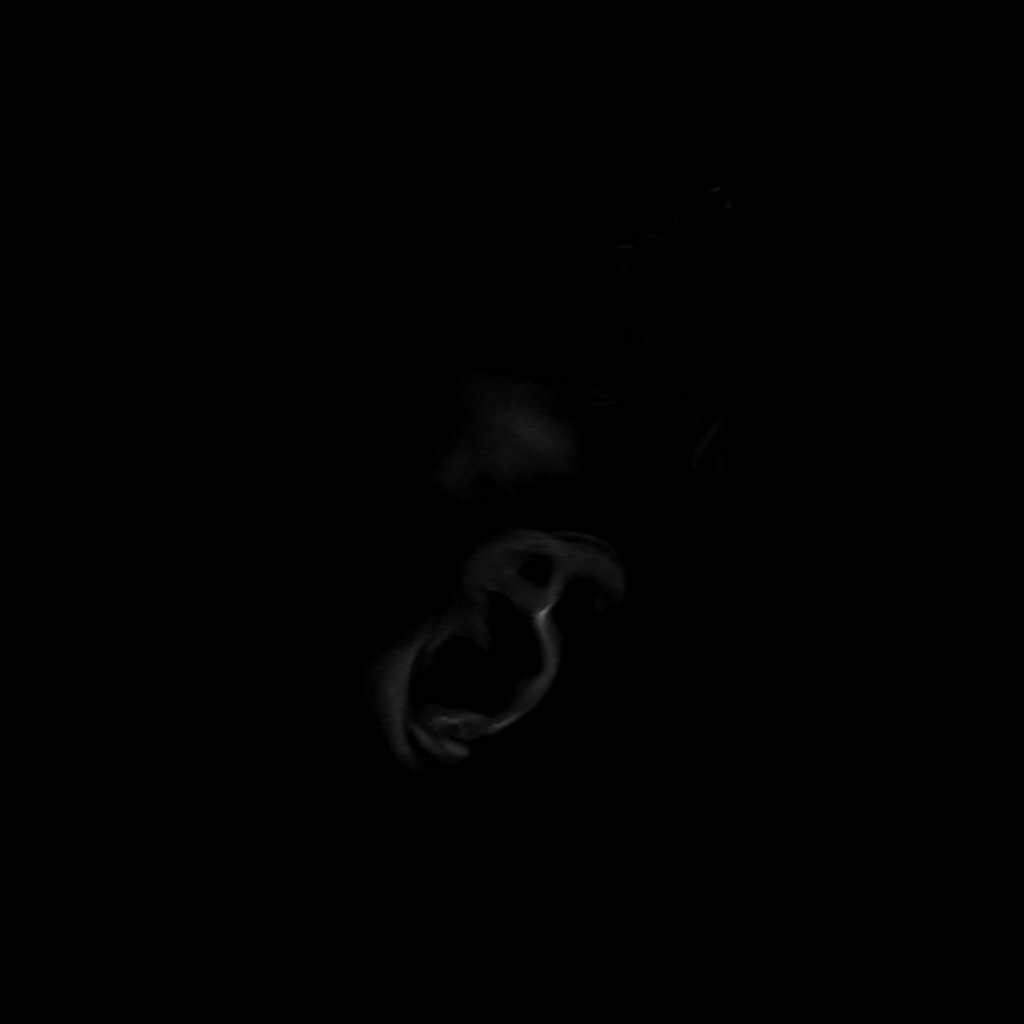

[Series 5: T2 · axial · 5.0mm · 0.23mm/px · 1 of 26 slices shown]
[im 1/26]
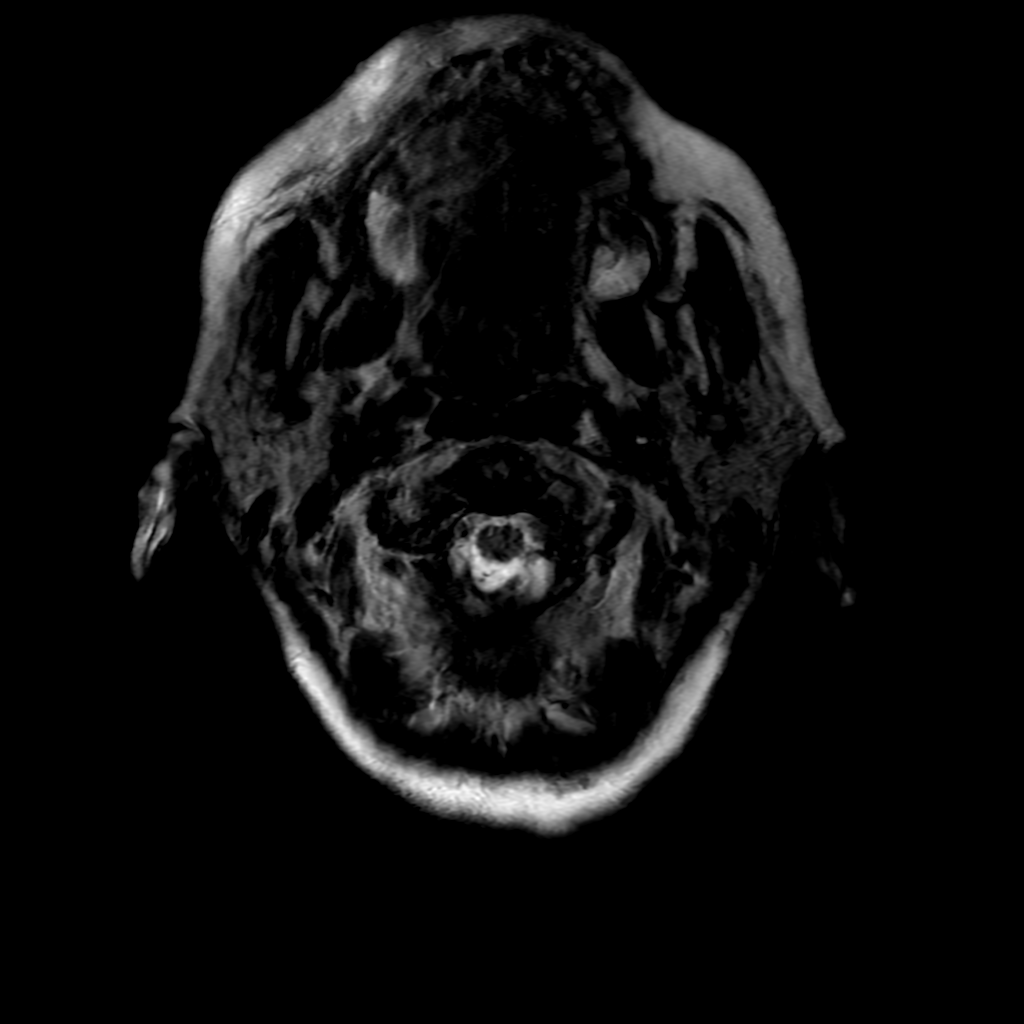

[Series 6: FLAIR · axial · 3.0mm · 0.45mm/px · z∈[-67,+69]mm · 2 of 25 slices shown (2 of 2)]
[im 1/25]
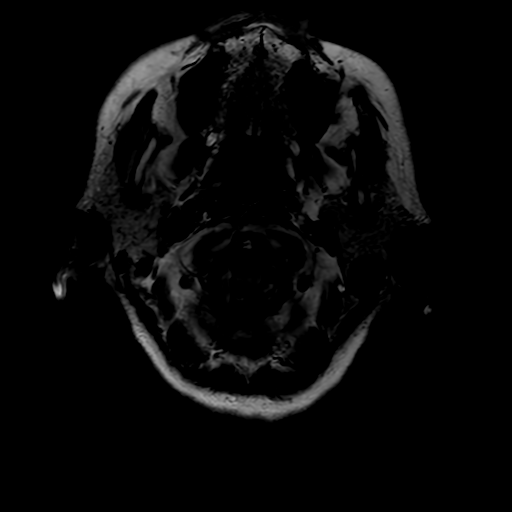
[im 25/25]
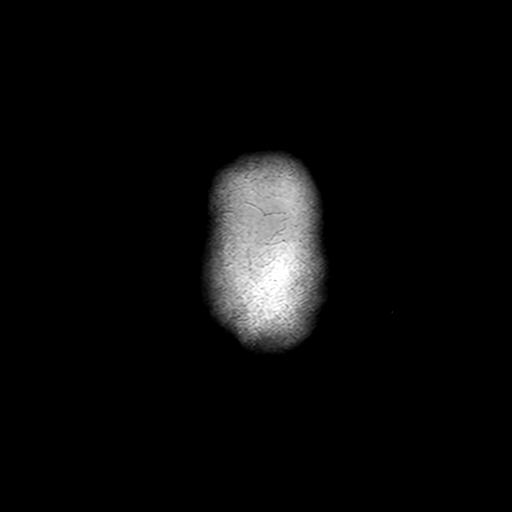

[Series 250: ADC · axial · 3.0mm · 0.94mm/px · z∈[-81,+59]mm · 4 of 50 slices shown (1 of 2)]
[im 1/50]
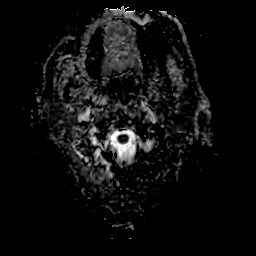
[im 17/50]
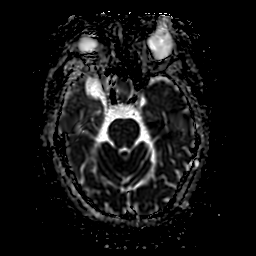
[im 33/50]
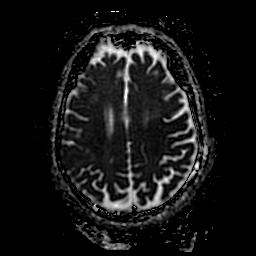
[im 50/50]
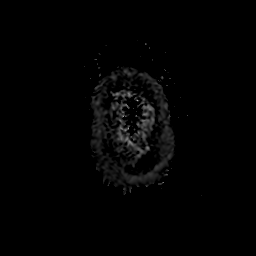

[Series 350: ADC · coronal · 4.0mm · 0.94mm/px · 3 of 38 slices shown (2 of 2)]
[im 1/38]
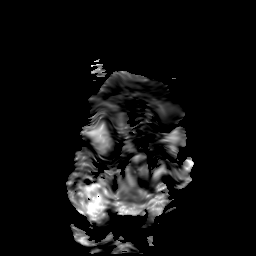
[im 19/38]
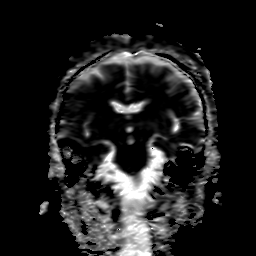
[im 38/38]
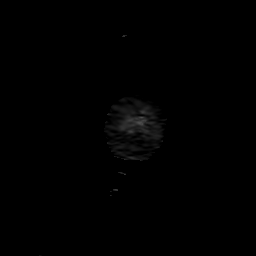

[25 of 48 positions shown; findings below may reference images not displayed]

FINDINGS: Brain: There is an acute infarct of the pons,
left-greater-than-right. There is also punctate focus of acute
ischemia in the left cerebellum. No acute ischemia of the midbrain
or above. There is multifocal periventricular white matter
hyperintensity, most often a result of chronic microvascular
ischemia. Normal CSF volume. Small amount of hemosiderin at the
inferior pons. Foci of chronic microhemorrhage in the left parietal
and occipital lobes.

Vascular: Normal flow voids.

Skull and upper cervical spine: Normal marrow signal.

Sinuses/Orbits: Negative.

Other: None.
IMPRESSION: 1. Acute bilateral pontine infarcts, left-greater-than-right.
2. Punctate focus of acute ischemia in the left cerebellum.
3. Findings of chronic microvascular ischemia.

## 2021-02-13 NOTE — Progress Notes (Signed)
[name], [name] ([id number]) Visit Report for 02/08/2021 Arrival Information Details Patient Name: Date of Service: [name], [name] 02/08/2021 8:45 A M Medical Record Number: [id number] Patient Account Number: [id number] Date of Birth/Sex: Treating RN: 1940-10-08 (80 y.o. [name] Primary Care Lindzy Rupert: Nolene Ebbs Other Clinician: Referring Serjio Deupree: Treating Anarely Nicholls/Extender: Lenard Galloway in Treatment: 12 Visit Information History Since Last Visit All ordered tests and consults were completed: No Patient Arrived: Stretcher Added or deleted any medications: No Arrival Time: 09:15 Any new allergies or adverse reactions: No Accompanied By: son and EMS staff Had a fall or experienced change in No Transfer Assistance: Stretcher activities of daily living that may affect Patient Identification Verified: Yes risk of falls: Secondary Verification Process Completed: Yes Signs or symptoms of abuse/neglect since last visito No Patient Requires Transmission-Based Precautions: No Hospitalized since last visit: No Patient Has Alerts: No Implantable device outside of the clinic excluding No cellular tissue based products placed in the center since last visit: Has Dressing in Place as Prescribed: Yes Pain Present Now: No Electronic Signature(s) Signed: 02/13/2021 6:17:43 PM By: Leane Call Entered By: Leane Call on 02/08/2021 09:37:47 -------------------------------------------------------------------------------- Clinic Level of Care Assessment Details Patient Name: Date of Service: [name] 02/08/2021 8:45 A M Medical Record Number: [id number] Patient Account Number: [id number] Date of Birth/Sex: Treating RN: Dec 17, 1940 (80 y.o. Helene Shoe, Meta.Reding Primary Care Vasco Chong: Nolene Ebbs Other Clinician: Referring Leith Hedlund: Treating Kerrington Greenhalgh/Extender: Lenard Galloway in Treatment: 12 Clinic Level of Care  Assessment Items TOOL 4 Quantity Score X- 1 0 Use when only an EandM is performed on FOLLOW-UP visit ASSESSMENTS - Nursing Assessment / Reassessment X- 1 10 Reassessment of Co-morbidities (includes updates in patient status) X- 1 5 Reassessment of Adherence to Treatment Plan ASSESSMENTS - Wound and Skin A ssessment / Reassessment '[]'  - 0 Simple Wound Assessment / Reassessment - one wound X- 2 5 Complex Wound Assessment / Reassessment - multiple wounds X- 1 10 Dermatologic / Skin Assessment (not related to wound area) ASSESSMENTS - Focused Assessment '[]'  - 0 Circumferential Edema Measurements - multi extremities X- 1 10 Nutritional Assessment / Counseling / Intervention '[]'  - 0 Lower Extremity Assessment (monofilament, tuning fork, pulses) '[]'  - 0 Peripheral Arterial Disease Assessment (using hand held doppler) ASSESSMENTS - Ostomy and/or Continence Assessment and Care '[]'  - 0 Incontinence Assessment and Management '[]'  - 0 Ostomy Care Assessment and Management (repouching, etc.) PROCESS - Coordination of Care '[]'  - 0 Simple Patient / Family Education for ongoing care X- 1 20 Complex (extensive) Patient / Family Education for ongoing care X- 1 10 Staff obtains Programmer, systems, Records, T Results / Process Orders est X- 1 10 Staff telephones HHA, Nursing Homes / Clarify orders / etc '[]'  - 0 Routine Transfer to another Facility (non-emergent condition) '[]'  - 0 Routine Hospital Admission (non-emergent condition) '[]'  - 0 New Admissions / Biomedical engineer / Ordering NPWT Apligraf, etc. , '[]'  - 0 Emergency Hospital Admission (emergent condition) '[]'  - 0 Simple Discharge Coordination X- 1 15 Complex (extensive) Discharge Coordination PROCESS - Special Needs '[]'  - 0 Pediatric / Minor Patient Management '[]'  - 0 Isolation Patient Management '[]'  - 0 Hearing / Language / Visual special needs '[]'  - 0 Assessment of Community assistance (transportation, D/C planning, etc.) '[]'  -  0 Additional assistance / Altered mentation '[]'  - 0 Support Surface(s) Assessment (bed, cushion, seat, etc.) INTERVENTIONS - Wound Cleansing / Measurement '[]'  - 0 Simple Wound Cleansing - one wound X- 2 5  Complex Wound Cleansing - multiple wounds X- 1 5 Wound Imaging (photographs - any number of wounds) '[]'  - 0 Wound Tracing (instead of photographs) '[]'  - 0 Simple Wound Measurement - one wound X- 2 5 Complex Wound Measurement - multiple wounds INTERVENTIONS - Wound Dressings X - Small Wound Dressing one or multiple wounds 2 10 '[]'  - 0 Medium Wound Dressing one or multiple wounds '[]'  - 0 Large Wound Dressing one or multiple wounds '[]'  - 0 Application of Medications - topical '[]'  - 0 Application of Medications - injection INTERVENTIONS - Miscellaneous '[]'  - 0 External ear exam '[]'  - 0 Specimen Collection (cultures, biopsies, blood, body fluids, etc.) '[]'  - 0 Specimen(s) / Culture(s) sent or taken to Lab for analysis '[]'  - 0 Patient Transfer (multiple staff / Civil Service fast streamer / Similar devices) '[]'  - 0 Simple Staple / Suture removal (25 or less) '[]'  - 0 Complex Staple / Suture removal (26 or more) '[]'  - 0 Hypo / Hyperglycemic Management (close monitor of Blood Glucose) '[]'  - 0 Ankle / Brachial Index (ABI) - do not check if billed separately X- 1 5 Vital Signs Has the patient been seen at the hospital within the last three years: Yes Total Score: 150 Level Of Care: New/Established - Level 4 Electronic Signature(s) Signed: 02/08/2021 6:57:58 PM By: Deon Pilling Entered By: Deon Pilling on 02/08/2021 10:12:18 -------------------------------------------------------------------------------- Encounter Discharge Information Details Patient Name: Date of Service: Sarah Seltzer D. 02/08/2021 8:45 A M Medical Record Number: [id number] Patient Account Number: [id number] Date of Birth/Sex: Treating RN: 11-04-1940 (80 y.o. [name] Primary Care Ladavia Lindenbaum: Nolene Ebbs Other  Clinician: Referring Kalene Cutler: Treating Jeslin Bazinet/Extender: Lenard Galloway in Treatment: 12 Encounter Discharge Information Items Discharge Condition: Stable Ambulatory Status: Stretcher Discharge Destination: Home Transportation: Ambulance Accompanied By: EMS staff Schedule Follow-up Appointment: No Clinical Summary of Care: Patient Declined Electronic Signature(s) Signed: 02/13/2021 6:17:43 PM By: Leane Call Entered By: Leane Call on 02/08/2021 10:32:07 -------------------------------------------------------------------------------- Lower Extremity Assessment Details Patient Name: Date of Service: [name] 02/08/2021 8:45 A M Medical Record Number: [id number] Patient Account Number: [id number] Date of Birth/Sex: Treating RN: 01/15/1941 (80 y.o. [name] Primary Care Kayann Maj: Nolene Ebbs Other Clinician: Referring Finnegan Gatta: Treating Dionysios Massman/Extender: Lenard Galloway in Treatment: 12 Electronic Signature(s) Signed: 02/13/2021 6:17:43 PM By: Leane Call Entered By: Leane Call on 02/08/2021 09:39:09 -------------------------------------------------------------------------------- Multi Wound Chart Details Patient Name: Date of Service: Sarah Seltzer D. 02/08/2021 8:45 A M Medical Record Number: [id number] Patient Account Number: [id number] Date of Birth/Sex: Treating RN: Feb 13, 1941 (80 y.o. Helene Shoe, Meta.Reding Primary Care Brianah Hopson: Nolene Ebbs Other Clinician: Referring Linwood Gullikson: Treating Naythan Douthit/Extender: Lenard Galloway in Treatment: 12 Vital Signs Height(in): Pulse(bpm): 41 Weight(lbs): Blood Pressure(mmHg): 126/67 Body Mass Index(BMI): Temperature(F): 98.4 Respiratory Rate(breaths/min): 20 Photos: [5:No Photos Left Gluteus] [6:No Photos Midline Back] [N/A:N/A N/A] Wound Location: [5:Gradually Appeared] [6:Surgical Injury] [N/A:N/A] Wounding Event:  [5:Pressure Ulcer] [6:Pressure Ulcer] [N/A:N/A] Primary Etiology: [5:Hypertension, Dementia] [6:Hypertension, Dementia] [N/A:N/A] Comorbid History: [5:09/12/2020] [6:09/12/2020] [N/A:N/A] Date Acquired: [5:12] [6:12] [N/A:N/A] Weeks of Treatment: [5:Open] [6:Open] [N/A:N/A] Wound Status: [5:0.9x0.9x1.8] [6:3.5x4.4x0.8] [N/A:N/A] Measurements L x W x D (cm) [5:0.636] [6:12.095] [N/A:N/A] A (cm) : rea [5:1.145] [6:9.676] [N/A:N/A] Volume (cm) : [5:73.00%] [6:31.60%] [N/A:N/A] % Reduction in A rea: [5:51.40%] [6:45.20%] [N/A:N/A] % Reduction in Volume: [5:12] Position 1 (o'clock): [5:4] Maximum Distance 1 (cm): [5:9] Starting Position 1 (o'clock): [5:11] Ending Position 1 (o'clock): [5:1.5] Maximum Distance 1 (cm): [5:1] Starting Position 2 (o'clock): [5:6] Ending Position 2 (  o'clock): [5:3] Maximum Distance 2 (cm): [5:Yes] [6:No] [N/A:N/A] Tunneling: [5:Yes] [6:No] [N/A:N/A] Undermining: [5:Category/Stage II] [6:Category/Stage III] [N/A:N/A] Classification: [5:Medium] [6:Medium] [N/A:N/A] Exudate A mount: [5:Serosanguineous] [6:Serosanguineous] [N/A:N/A] Exudate Type: [5:red, brown] [6:red, brown] [N/A:N/A] Exudate Color: [5:Well defined, not attached] [6:Well defined, not attached] [N/A:N/A] Wound Margin: [5:Large (67-100%)] [6:Large (67-100%)] [N/A:N/A] Granulation A mount: [5:Red, Friable] [6:Red, Pink, Friable] [N/A:N/A] Granulation Quality: [5:None Present (0%)] [6:None Present (0%)] [N/A:N/A] Necrotic A mount: [5:Fat Layer (Subcutaneous Tissue): Yes Fat Layer (Subcutaneous Tissue): Yes N/A] Exposed Structures: [5:Fascia: No Tendon: No Muscle: No Joint: No Bone: No None] [6:Fascia: No Tendon: No Muscle: No Joint: No Bone: No None] [N/A:N/A] Treatment Notes Electronic Signature(s) Signed: 02/08/2021 5:19:12 PM By: Linton Ham MD Signed: 02/08/2021 6:57:58 PM By: Deon Pilling Entered By: Linton Ham on 02/08/2021  10:00:55 -------------------------------------------------------------------------------- Multi-Disciplinary Care Plan Details Patient Name: Date of Service: Sarah Seltzer D. 02/08/2021 8:45 A M Medical Record Number: [id number] Patient Account Number: [id number] Date of Birth/Sex: Treating RN: 01-03-1941 (80 y.o. Debby Bud Primary Care Evett Kassa: Nolene Ebbs Other Clinician: Referring Yutaka Holberg: Treating Sonita Michiels/Extender: Lenard Galloway in Treatment: 12 Active Inactive Pressure Nursing Diagnoses: Knowledge deficit related to causes and risk factors for pressure ulcer development Goals: Patient will remain free from development of additional pressure ulcers Date Initiated: 11/10/2020 Target Resolution Date: 02/23/2021 Goal Status: Active Interventions: Assess: immobility, friction, shearing, incontinence upon admission and as needed Assess offloading mechanisms upon admission and as needed Assess potential for pressure ulcer upon admission and as needed Provide education on pressure ulcers Treatment Activities: Patient referred for home evaluation of offloading devices/mattresses : 11/10/2020 Notes: 12/22/20 Pressure relief ongoing issue. 01/26/21: No new wounds, pressure relief ongoing. Wound/Skin Impairment Nursing Diagnoses: Impaired tissue integrity Goals: Patient/caregiver will verbalize understanding of skin care regimen Date Initiated: 11/10/2020 Date Inactivated: 12/08/2020 Target Resolution Date: 12/08/2020 Goal Status: Met Ulcer/skin breakdown will have a volume reduction of 30% by week 4 Date Initiated: 11/10/2020 Date Inactivated: 12/22/2020 Target Resolution Date: 12/08/2020 Goal Status: Met Ulcer/skin breakdown will have a volume reduction of 50% by week 8 Date Initiated: 12/22/2020 Date Inactivated: 01/26/2021 Target Resolution Date: 01/12/2021 Goal Status: Met Ulcer/skin breakdown will have a volume reduction of 80% by week 12 Date  Initiated: 01/26/2021 Target Resolution Date: 02/23/2021 Goal Status: Active Interventions: Assess patient/caregiver ability to obtain necessary supplies Assess patient/caregiver ability to perform ulcer/skin care regimen upon admission and as needed Assess ulceration(s) every visit Provide education on ulcer and skin care Treatment Activities: Skin care regimen initiated : 11/10/2020 Topical wound management initiated : 11/10/2020 Notes: 01/26/21: Wounds greater than 50% volume reduction, new goal initiated. Electronic Signature(s) Signed: 02/08/2021 6:57:58 PM By: Deon Pilling Entered By: Deon Pilling on 02/08/2021 10:11:26 -------------------------------------------------------------------------------- Pain Assessment Details Patient Name: Date of Service: [name], [name] 02/08/2021 8:45 A M Medical Record Number: [id number] Patient Account Number: [id number] Date of Birth/Sex: Treating RN: [birth year] (80 y.o. [name] Primary Care Numair Masden: Nolene Ebbs Other Clinician: Referring Timoty Bourke: Treating Prabhjot Maddux/Extender: Lenard Galloway in Treatment: 12 Active Problems Location of Pain Severity and Description of Pain Patient Has Paino Yes Site Locations Pain Location: Generalized Pain With Dressing Change: Yes Duration of the Pain. Constant / Intermittento Constant Pain Management and Medication Current Pain Management: Notes pt unable to understand concept of pain scale Electronic Signature(s) Signed: 02/13/2021 6:17:43 PM By: Leane Call Entered By: Leane Call on 02/08/2021 09:38:59 -------------------------------------------------------------------------------- Patient/Caregiver Education Details Patient Name: Date of Service: [name] 6/30/2022andnbsp8:45 A M Medical Record  Number: [id number] Patient Account Number: [id number] Date of Birth/Gender: Treating RN: 1941-03-05 (80 y.o. Debby Bud Primary Care  Physician: Nolene Ebbs Other Clinician: Referring Physician: Treating Physician/Extender: Lenard Galloway in Treatment: 12 Education Assessment Education Provided To: Patient Education Topics Provided Pressure: Handouts: Pressure Ulcers: Care and Offloading Methods: Explain/Verbal Responses: Reinforcements needed Electronic Signature(s) Signed: 02/08/2021 6:57:58 PM By: Deon Pilling Entered By: Deon Pilling on 02/08/2021 10:11:39 -------------------------------------------------------------------------------- Wound Assessment Details Patient Name: Date of Service: [name], [name] 02/08/2021 8:45 A M Medical Record Number: [id number] Patient Account Number: [id number] Date of Birth/Sex: Treating RN: 1941-06-21 (80 y.o. [name] Primary Care Kanesha Cadle: Nolene Ebbs Other Clinician: Referring Morgaine Kimball: Treating Gracen Ringwald/Extender: Lenard Galloway in Treatment: 12 Wound Status Wound Number: 5 Primary Etiology: Pressure Ulcer Wound Location: Left Gluteus Wound Status: Open Wounding Event: Gradually Appeared Comorbid History: Hypertension, Dementia Date Acquired: 09/12/2020 Weeks Of Treatment: 12 Clustered Wound: No Photos Photo Uploaded By: Sandre Kitty on 02/08/2021 17:04:11 Wound Measurements Length: (cm) 0.9 % Re Width: (cm) 0.9 % Re Depth: (cm) 1.8 Epit Area: (cm) 0.636 Tun Volume: (cm) 1.145 M duction in Area: 73% duction in Volume: 51.4% helialization: None neling: Yes Position (o'clock): 3 aximum Distance: (cm) 7 Undermining: No Wound Description Classification: Category/Stage II Foul Wound Margin: Well defined, not attached Slou Exudate Amount: Medium Exudate Type: Serosanguineous Exudate Color: red, brown Odor After Cleansing: No gh/Fibrino No Wound Bed Granulation Amount: Large (67-100%) Exposed Structure Granulation Quality: Red, Friable Fascia Exposed: No Necrotic Amount: None  Present (0%) Fat Layer (Subcutaneous Tissue) Exposed: Yes Tendon Exposed: No Muscle Exposed: No Joint Exposed: No Bone Exposed: No Treatment Notes Wound #5 (Gluteus) Wound Laterality: Left Cleanser Peri-Wound Care Topical Primary Dressing Secondary Dressing Secured With Compression Wrap Compression Stockings Add-Ons Electronic Signature(s) Signed: 02/08/2021 6:57:58 PM By: Deon Pilling Signed: 02/13/2021 6:17:43 PM By: Leane Call Entered By: Deon Pilling on 02/08/2021 10:01:30 -------------------------------------------------------------------------------- Wound Assessment Details Patient Name: Date of Service: Sarah Seltzer D. 02/08/2021 8:45 A M Medical Record Number: [id number] Patient Account Number: [id number] Date of Birth/Sex: Treating RN: 1941/07/01 (80 y.o. [name] Primary Care Kennesha Brewbaker: Nolene Ebbs Other Clinician: Referring Leonard Feigel: Treating Vora Clover/Extender: Lenard Galloway in Treatment: 12 Wound Status Wound Number: 6 Primary Etiology: Pressure Ulcer Wound Location: Midline Back Wound Status: Open Wounding Event: Surgical Injury Comorbid History: Hypertension, Dementia Date Acquired: 09/12/2020 Weeks Of Treatment: 12 Clustered Wound: No Photos Photo Uploaded By: Sandre Kitty on 02/08/2021 17:04:12 Wound Measurements Length: (cm) 3.5 Width: (cm) 4.4 Depth: (cm) 0.8 Area: (cm) 12.095 Volume: (cm) 9.676 % Reduction in Area: 31.6% % Reduction in Volume: 45.2% Epithelialization: None Tunneling: No Undermining: No Wound Description Classification: Category/Stage III Wound Margin: Well defined, not attached Exudate Amount: Medium Exudate Type: Serosanguineous Exudate Color: red, brown Foul Odor After Cleansing: No Slough/Fibrino No Wound Bed Granulation Amount: Large (67-100%) Exposed Structure Granulation Quality: Red, Pink, Friable Fascia Exposed: No Necrotic Amount: None Present (0%) Fat  Layer (Subcutaneous Tissue) Exposed: Yes Tendon Exposed: No Muscle Exposed: No Joint Exposed: No Bone Exposed: No Treatment Notes Wound #6 (Back) Wound Laterality: Midline Cleanser Peri-Wound Care Topical Primary Dressing Secondary Dressing Secured With Compression Wrap Compression Stockings Add-Ons Electronic Signature(s) Signed: 02/13/2021 6:17:43 PM By: Leane Call Entered By: Leane Call on 02/08/2021 09:41:05 -------------------------------------------------------------------------------- Vitals Details Patient Name: Date of Service: Sarah Seltzer D. 02/08/2021 8:45 A M Medical Record Number: [id number] Patient Account Number: [id number] Date of Birth/Sex: Treating RN: November 24, 1940 (80 y.o. F) Priddy,  Anderson Malta Primary Care Jazzy Parmer: Nolene Ebbs Other Clinician: Referring Aune Adami: Treating Christapher Gillian/Extender: Lenard Galloway in Treatment: 12 Vital Signs Time Taken: 09:16 Temperature (F): 98.4 Pulse (bpm): 79 Respiratory Rate (breaths/min): 20 Blood Pressure (mmHg): 126/67 Reference Range: 80 - 120 mg / dl Electronic Signature(s) Signed: 02/13/2021 6:17:43 PM By: Leane Call Entered By: Leane Call on 02/08/2021 09:38:09

## 2021-02-21 ENCOUNTER — Encounter: Payer: Self-pay | Admitting: Cardiology

## 2021-02-22 ENCOUNTER — Encounter (HOSPITAL_BASED_OUTPATIENT_CLINIC_OR_DEPARTMENT_OTHER): Payer: Medicare Other | Admitting: Internal Medicine

## 2021-03-01 ENCOUNTER — Encounter (HOSPITAL_BASED_OUTPATIENT_CLINIC_OR_DEPARTMENT_OTHER): Payer: Medicare Other | Attending: Internal Medicine | Admitting: Internal Medicine

## 2021-03-01 ENCOUNTER — Other Ambulatory Visit: Payer: Self-pay

## 2021-03-01 DIAGNOSIS — L89323 Pressure ulcer of left buttock, stage 3: Secondary | ICD-10-CM | POA: Insufficient documentation

## 2021-03-01 DIAGNOSIS — Z86718 Personal history of other venous thrombosis and embolism: Secondary | ICD-10-CM | POA: Diagnosis not present

## 2021-03-01 DIAGNOSIS — L89103 Pressure ulcer of unspecified part of back, stage 3: Secondary | ICD-10-CM | POA: Diagnosis not present

## 2021-03-01 DIAGNOSIS — Z7901 Long term (current) use of anticoagulants: Secondary | ICD-10-CM | POA: Diagnosis not present

## 2021-03-01 DIAGNOSIS — E11622 Type 2 diabetes mellitus with other skin ulcer: Secondary | ICD-10-CM | POA: Insufficient documentation

## 2021-03-01 NOTE — Progress Notes (Signed)
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 1 Physician Procedures : CPT4 Code Description Modifier [id number] - WC PHYS LEVEL 3 - EST PT ICD-10 Diagnosis Description L89.103 Pressure ulcer of unspecified part of back, stage 3 L89.323 Pressure ulcer of left buttock, stage 3 T81.31XA Disruption of external operation  (surgical) wound, not elsewhere classified, initial encounter Quantity: 1 Electronic Signature(s) Signed: 03/01/2021 5:24:41 PM By: Baltazar Najjar MD Signed: 03/01/2021 6:31:47 PM By: Shawn Stall Entered By: Shawn Stall on 03/01/2021 09:56:59

## 2021-03-06 NOTE — Progress Notes (Signed)
[name], [name] ([id number]) Visit Report for 03/01/2021 Arrival Information Details Patient Name: Date of Service: [name], [name] 03/01/2021 8:30 A M Medical Record Number: [id number] Patient Account Number: [id number] Date of Birth/Sex: Treating RN: October 06, 1940 (80 y.o. [name] Primary Care [name]: [name] Other Clinician: Referring [name]: Treating [name]/Extender: [name] in Treatment: 15 Visit Information History Since Last Visit Added or deleted any medications: No Patient Arrived: Stretcher Any new allergies or adverse reactions: No Arrival Time: 08:50 Had a fall or experienced change in No Accompanied By: son, EMS activities of daily living that may affect Transfer Assistance: Stretcher risk of falls: Patient Identification Verified: Yes Signs or symptoms of abuse/neglect since last visito No Secondary Verification Process Completed: Yes Hospitalized since last visit: No Patient Requires Transmission-Based Precautions: No Implantable device outside of the clinic excluding No Patient Has Alerts: No cellular tissue based products placed in the center since last visit: Has Dressing in Place as Prescribed: Yes Pain Present Now: Yes Electronic Signature(s) Signed: 03/01/2021 6:07:52 PM By: Zenaida Deed RN, BSN Entered By: Zenaida Deed on 03/01/2021 08:51:09 -------------------------------------------------------------------------------- Clinic Level of Care Assessment Details Patient Name: Date of Service: [name], [name] 03/01/2021 8:30 A M Medical Record Number: [id number] Patient Account Number: [id number] Date of Birth/Sex: Treating RN: 1941-04-24 (80 y.o. [name] Primary Care Lidia Clavijo: [name] Other Clinician: Referring Pressley Tadesse: Treating Klarissa Mcilvain/Extender: [name] in Treatment: 15 Clinic Level of Care Assessment Items TOOL 4 Quantity Score X- 1 0 Use  when only an EandM is performed on FOLLOW-UP visit ASSESSMENTS - Nursing Assessment / Reassessment X- 1 10 Reassessment of Co-morbidities (includes updates in patient status) X- 1 5 Reassessment of Adherence to Treatment Plan ASSESSMENTS - Wound and Skin A ssessment / Reassessment []  - 0 Simple Wound Assessment / Reassessment - one wound X- 2 5 Complex Wound Assessment / Reassessment - multiple wounds X- 1 10 Dermatologic / Skin Assessment (not related to wound area) ASSESSMENTS - Focused Assessment []  - 0 Circumferential Edema Measurements - multi extremities X- 1 10 Nutritional Assessment / Counseling / Intervention []  - 0 Lower Extremity Assessment (monofilament, tuning fork, pulses) []  - 0 Peripheral Arterial Disease Assessment (using hand held doppler) ASSESSMENTS - Ostomy and/or Continence Assessment and Care []  - 0 Incontinence Assessment and Management []  - 0 Ostomy Care Assessment and Management (repouching, etc.) PROCESS - Coordination of Care []  - 0 Simple Patient / Family Education for ongoing care X- 1 20 Complex (extensive) Patient / Family Education for ongoing care X- 1 10 Staff obtains Consents, Records, T Results / Process Orders est X- 1 10 Staff telephones HHA, Nursing Homes / Clarify orders / etc []  - 0 Routine Transfer to another Facility (non-emergent condition) []  - 0 Routine Hospital Admission (non-emergent condition) []  - 0 New Admissions / Manufacturing engineer / Ordering NPWT Apligraf, etc. , []  - 0 Emergency Hospital Admission (emergent condition) []  - 0 Simple Discharge Coordination X- 1 15 Complex (extensive) Discharge Coordination PROCESS - Special Needs []  - 0 Pediatric / Minor Patient Management []  - 0 Isolation Patient Management []  - 0 Hearing / Language / Visual special needs []  - 0 Assessment of Community assistance (transportation, D/C planning, etc.) []  - 0 Additional assistance / Altered mentation []  - 0 Support  Surface(s) Assessment (bed, cushion, seat, etc.) INTERVENTIONS - Wound Cleansing / Measurement []  - 0 Simple Wound Cleansing - one wound X- 2 5 Complex Wound Cleansing - multiple wounds X- 1  5 Wound Imaging (photographs - any number of wounds) []  - 0 Wound Tracing (instead of photographs) []  - 0 Simple Wound Measurement - one wound X- 2 5 Complex Wound Measurement - multiple wounds INTERVENTIONS - Wound Dressings X - Small Wound Dressing one or multiple wounds 1 10 X- 1 15 Medium Wound Dressing one or multiple wounds []  - 0 Large Wound Dressing one or multiple wounds []  - 0 Application of Medications - topical []  - 0 Application of Medications - injection INTERVENTIONS - Miscellaneous []  - 0 External ear exam []  - 0 Specimen Collection (cultures, biopsies, blood, body fluids, etc.) []  - 0 Specimen(s) / Culture(s) sent or taken to Lab for analysis []  - 0 Patient Transfer (multiple staff / Nurse, adultHoyer Lift / Similar devices) []  - 0 Simple Staple / Suture removal (25 or less) []  - 0 Complex Staple / Suture removal (26 or more) []  - 0 Hypo / Hyperglycemic Management (close monitor of Blood Glucose) []  - 0 Ankle / Brachial Index (ABI) - do not check if billed separately X- 1 5 Vital Signs Has the patient been seen at the hospital within the last three years: Yes Total Score: 155 Level Of Care: New/Established - Level 4 Electronic Signature(s) Signed: 03/01/2021 6:31:47 PM By: Shawn Stalleaton, Bobbi Entered By: Shawn Stalleaton, Bobbi on 03/01/2021 09:56:54 -------------------------------------------------------------------------------- Complex / Palliative Patient Assessment Details Patient Name: Date of Service: [name], Sarah D. 03/01/2021 8:30 A M Medical Record Number: [id number] Patient Account Number: [id number] Date of Birth/Sex: Treating RN: 1940/12/14 (80 y.o. [name]) Boehlein, Linda Primary Care Icarus Partch: Sarah ContrasAvbuere, Edwin Other Clinician: Referring Yemariam Magar: Treating Valen Mascaro/Extender:  Sarah Sabinobson, Michael Avbuere, Edwin Weeks in Treatment: 15 Palliative Management Criteria Complex Wound Management Criteria Patient has remarkable or complex co-morbidities requiring medications or treatments that extend wound healing times. Examples: Diabetes mellitus with chronic renal failure or end stage renal disease requiring dialysis Advanced or poorly controlled rheumatoid arthritis Diabetes mellitus and end stage chronic obstructive pulmonary disease Active cancer with current chemo- or radiation therapy dementia, spinal hardware infection, nonambulatory, PEG, malnutrition Care Approach Wound Care Plan: Complex Wound Management Electronic Signature(s) Signed: 03/05/2021 4:44:37 PM By: Zenaida DeedBoehlein, Linda RN, BSN Signed: 03/06/2021 5:43:33 PM By: Baltazar Najjarobson, Michael MD Entered By: Zenaida DeedBoehlein, Linda on 03/05/2021 16:34:34 -------------------------------------------------------------------------------- Encounter Discharge Information Details Patient Name: Date of Service: [name], Sarah D. 03/01/2021 8:30 A M Medical Record Number: [id number] Patient Account Number: [id number] Date of Birth/Sex: Treating RN: 1940/12/14 (80 y.o. Wynelle LinkF) Lynch, [name] Primary Care Dekota Shenk: Sarah ContrasAvbuere, Edwin Other Clinician: Referring Torunn Chancellor: Treating Jasey Cortez/Extender: Sarah Sabinobson, Michael Avbuere, Edwin Weeks in Treatment: 15 Encounter Discharge Information Items Discharge Condition: Stable Ambulatory Status: Stretcher Discharge Destination: Home Transportation: Private Auto Accompanied By: son Schedule Follow-up Appointment: Yes Clinical Summary of Care: Patient Declined Electronic Signature(s) Signed: 03/05/2021 4:44:07 PM By: Zandra AbtsLynch, [name] RN, BSN Entered By: Zandra AbtsLynch, [name] on 03/01/2021 13:26:14 -------------------------------------------------------------------------------- Lower Extremity Assessment Details Patient Name: Date of Service: [name], Sarah D. 03/01/2021 8:30 A M Medical Record Number:  [id number] Patient Account Number: [id number] Date of Birth/Sex: Treating RN: 1940/12/14 (80 y.o. [name]) Boehlein, Linda Primary Care Ravonda Brecheen: Sarah ContrasAvbuere, Edwin Other Clinician: Referring Tyler Robidoux: Treating Madeline Pho/Extender: Sarah Sabinobson, Michael Avbuere, Edwin Weeks in Treatment: 15 Electronic Signature(s) Signed: 03/01/2021 6:07:52 PM By: Zenaida DeedBoehlein, Linda RN, BSN Entered By: Zenaida DeedBoehlein, Linda on 03/01/2021 09:08:48 -------------------------------------------------------------------------------- Multi Wound Chart Details Patient Name: Date of Service: [name], Sarah D. 03/01/2021 8:30 A M Medical Record Number: [id number] Patient Account Number: [id number] Date of Birth/Sex: Treating RN: 1940/12/14 (80 y.o. Arta SilenceF) Deaton, Bobbi Primary Care Larrie Fraizer: Concepcion ElkAvbuere,  Dorma Russell Other Clinician: Referring Accalia Rigdon: Treating Zakhi Dupre/Extender: [name] in Treatment: 15 Vital Signs Height(in): 62 Pulse(bpm): 66 Weight(lbs): Blood Pressure(mmHg): 131/71 Body Mass Index(BMI): Temperature(F): 98.7 Respiratory Rate(breaths/min): 18 Photos: [5:No Photos Left Gluteus] [6:No Photos Midline Back] [N/A:N/A N/A] Wound Location: [5:Gradually Appeared] [6:Surgical Injury] [N/A:N/A] Wounding Event: [5:Pressure Ulcer] [6:Pressure Ulcer] [N/A:N/A] Primary Etiology: [5:Hypertension, Dementia] [6:Hypertension, Dementia] [N/A:N/A] Comorbid History: [5:09/12/2020] [6:09/12/2020] [N/A:N/A] Date Acquired: [5:15] [6:15] [N/A:N/A] Weeks of Treatment: [5:Open] [6:Open] [N/A:N/A] Wound Status: [5:0.7x0.6x1.1] [6:2.4x3.3x0.6] [N/A:N/A] Measurements L x W x D (cm) [5:0.33] [6:6.22] [N/A:N/A] A (cm) : rea [5:0.363] [6:3.732] [N/A:N/A] Volume (cm) : [5:86.00%] [6:64.80%] [N/A:N/A] % Reduction in A rea: [5:84.60%] [6:78.90%] [N/A:N/A] % Reduction in Volume: [5:2] Position 1 (o'clock): [5:7.8] Maximum Distance 1 (cm): [6:9] Starting Position 1 (o'clock): [6:7] Ending Position 1 (o'clock):  [6:1.9] Maximum Distance 1 (cm): [5:Yes] [6:No] [N/A:N/A] Tunneling: [5:No] [6:Yes] [N/A:N/A] Undermining: [5:Category/Stage III] [6:Category/Stage III] [N/A:N/A] Classification: [5:Medium] [6:Medium] [N/A:N/A] Exudate A mount: [5:Serosanguineous] [6:Serosanguineous] [N/A:N/A] Exudate Type: [5:red, brown] [6:red, brown] [N/A:N/A] Exudate Color: [5:Well defined, not attached] [6:Well defined, not attached] [N/A:N/A] Wound Margin: [5:Large (67-100%)] [6:Large (67-100%)] [N/A:N/A] Granulation A mount: [5:Red] [6:Red] [N/A:N/A] Granulation Quality: [5:None Present (0%)] [6:None Present (0%)] [N/A:N/A] Necrotic Amount: [5:Fat Layer (Subcutaneous Tissue): Yes Fat Layer (Subcutaneous Tissue): Yes N/A] Exposed Structures: [5:Fascia: No Tendon: No Muscle: No Joint: No Bone: No None] [6:Fascia: No Tendon: No Muscle: No Joint: No Bone: No None] [N/A:N/A] Treatment Notes Electronic Signature(s) Signed: 03/01/2021 5:24:41 PM By: Baltazar Najjar MD Signed: 03/01/2021 6:31:47 PM By: Shawn Stall Entered By: Baltazar Najjar on 03/01/2021 09:50:59 -------------------------------------------------------------------------------- Multi-Disciplinary Care Plan Details Patient Name: Date of Service: Sarah Hose D. 03/01/2021 8:30 A M Medical Record Number: [id number] Patient Account Number: [id number] Date of Birth/Sex: Treating RN: 06-03-1941 (80 y.o. [name] Primary Care Lourdes Kucharski: [name] Other Clinician: Referring Adriyana Greenbaum: Treating Madysyn Hanken/Extender: [name] in Treatment: 15 Active Inactive Pressure Nursing Diagnoses: Knowledge deficit related to causes and risk factors for pressure ulcer development Goals: Patient will remain free from development of additional pressure ulcers Date Initiated: 11/10/2020 Target Resolution Date: 04/06/2021 Goal Status: Active Interventions: Assess: immobility, friction, shearing, incontinence upon admission and as  needed Assess offloading mechanisms upon admission and as needed Assess potential for pressure ulcer upon admission and as needed Provide education on pressure ulcers Treatment Activities: Patient referred for home evaluation of offloading devices/mattresses : 11/10/2020 Notes: 12/22/20 Pressure relief ongoing issue. 01/26/21: No new wounds, pressure relief ongoing. Electronic Signature(s) Signed: 03/01/2021 6:31:47 PM By: Shawn Stall Entered By: Shawn Stall on 03/01/2021 08:56:31 -------------------------------------------------------------------------------- Pain Assessment Details Patient Name: Date of Service: [name], [name] 03/01/2021 8:30 A M Medical Record Number: [id number] Patient Account Number: [id number] Date of Birth/Sex: Treating RN: [birth year] (80 y.o. [name] Primary Care Emilene Roma: [name] Other Clinician: Referring Noal Abshier: Treating Blossie Raffel/Extender: [name] in Treatment: 15 Active Problems Location of Pain Severity and Description of Pain Patient Has Paino Yes Site Locations Pain Location: Pain in Ulcers With Dressing Change: Yes Duration of the Pain. Constant / Intermittento Intermittent Rate the pain. Current Pain Level: 0 Worst Pain Level: 8 Least Pain Level: 0 Character of Pain Describe the Pain: Aching Pain Management and Medication Current Pain Management: Medication: Yes Is the Current Pain Management Adequate: Adequate How does your wound impact your activities of daily livingo Sleep: No Bathing: No Appetite: No Relationship With Others: No Bladder Continence: No Emotions: No Bowel Continence: No Hobbies: No Toileting: No Dressing:  No Electronic Signature(s) Signed: 03/01/2021 6:07:52 PM By: Zenaida Deed RN, BSN Entered By: Zenaida Deed on 03/01/2021 09:08:40 -------------------------------------------------------------------------------- Patient/Caregiver Education  Details Patient Name: Date of Service: Sarah Stalling 7/21/2022andnbsp8:30 A M Medical Record Number: [id number] Patient Account Number: [id number] Date of Birth/Gender: Treating RN: [birth year] (80 y.o. [name] Primary Care Physician: [name] Other Clinician: Referring Physician: Treating Physician/Extender: [name] in Treatment: 15 Education Assessment Education Provided To: Patient Education Topics Provided Pressure: Handouts: Preventing Pressure Ulcers Methods: Explain/Verbal Responses: Reinforcements needed Electronic Signature(s) Signed: 03/01/2021 6:31:47 PM By: Shawn Stall Entered By: Shawn Stall on 03/01/2021 08:56:47 -------------------------------------------------------------------------------- Wound Assessment Details Patient Name: Date of Service: Sarah Hose D. 03/01/2021 8:30 A M Medical Record Number: [id number] Patient Account Number: [id number] Date of Birth/Sex: Treating RN: Sep 16, 1940 (80 y.o. [name] Primary Care Lahela Woodin: [name] Other Clinician: Referring Bernestine Holsapple: Treating Keawe Marcello/Extender: [name] in Treatment: 15 Wound Status Wound Number: 5 Primary Etiology: Pressure Ulcer Wound Location: Left Gluteus Wound Status: Open Wounding Event: Gradually Appeared Comorbid History: Hypertension, Dementia Date Acquired: 09/12/2020 Weeks Of Treatment: 15 Clustered Wound: No Photos Photo Uploaded By: Haywood Pao on 03/02/2021 08:38:53 Wound Measurements Length: (cm) 0.7 Width: (cm) 0.6 Depth: (cm) 1.1 Area: (cm) 0.33 Volume: (cm) 0.363 % Reduction in Area: 86% % Reduction in Volume: 84.6% Epithelialization: None Tunneling: Yes Position (o'clock): 2 Maximum Distance: (cm) 7.8 Undermining: No Wound Description Classification: Category/Stage III Wound Margin: Well defined, not attached Exudate Amount: Medium Exudate Type:  Serosanguineous Exudate Color: red, brown Foul Odor After Cleansing: No Slough/Fibrino No Wound Bed Granulation Amount: Large (67-100%) Exposed Structure Granulation Quality: Red Fascia Exposed: No Necrotic Amount: None Present (0%) Fat Layer (Subcutaneous Tissue) Exposed: Yes Tendon Exposed: No Muscle Exposed: No Joint Exposed: No Bone Exposed: No Treatment Notes Wound #5 (Gluteus) Wound Laterality: Left Cleanser Wound Cleanser Discharge Instruction: Cleanse the wound with wound cleanser prior to applying a clean dressing using gauze sponges, not tissue or cotton balls. Peri-Wound Care Topical Primary Dressing Iodoform packing strip 1/4 (in) Discharge Instruction: Lightly pack as instructed Secondary Dressing Zetuvit Plus Silicone Border Dressing 4x4 (in/in) Discharge Instruction: Apply silicone border or abd pad with tape over primary dressing as directed. Secured With Compression Wrap Compression Stockings Facilities manager) Signed: 03/01/2021 6:07:52 PM By: Zenaida Deed RN, BSN Entered By: Zenaida Deed on 03/01/2021 09:10:09 -------------------------------------------------------------------------------- Wound Assessment Details Patient Name: Date of Service: Sarah Hose D. 03/01/2021 8:30 A M Medical Record Number: [id number] Patient Account Number: [id number] Date of Birth/Sex: Treating RN: [birth year] (80 y.o. [name] Primary Care Orpah Hausner: [name] Other Clinician: Referring Ardra Kuznicki: Treating Kaylum Shrum/Extender: [name] in Treatment: 15 Wound Status Wound Number: 6 Primary Etiology: Pressure Ulcer Wound Location: Midline Back Wound Status: Open Wounding Event: Surgical Injury Comorbid History: Hypertension, Dementia Date Acquired: 09/12/2020 Weeks Of Treatment: 15 Clustered Wound: No Photos Photo Uploaded By: Haywood Pao on 03/02/2021 08:38:53 Wound Measurements Length: (cm)  2.4 Width: (cm) 3.3 Depth: (cm) 0.6 Area: (cm) 6.22 Volume: (cm) 3.732 % Reduction in Area: 64.8% % Reduction in Volume: 78.9% Epithelialization: None Tunneling: No Undermining: Yes Starting Position (o'clock): 9 Ending Position (o'clock): 7 Maximum Distance: (cm) 1.9 Wound Description Classification: Category/Stage III Wound Margin: Well defined, not attached Exudate Amount: Medium Exudate Type: Serosanguineous Exudate Color: red, brown Foul Odor After Cleansing: No Slough/Fibrino No Wound Bed Granulation Amount: Large (67-100%) Exposed Structure Granulation Quality: Red Fascia Exposed: No Necrotic Amount: None Present (0%) Fat  Layer (Subcutaneous Tissue) Exposed: Yes Tendon Exposed: No Muscle Exposed: No Joint Exposed: No Bone Exposed: No Treatment Notes Wound #6 (Back) Wound Laterality: Midline Cleanser Peri-Wound Care Topical Primary Dressing wet to dry dressing in clinic only Secondary Dressing Zetuvit Plus Silicone Border Dressing 4x4 (in/in) Discharge Instruction: Apply silicone border over primary dressing as directed. Secured With Compression Wrap Compression Stockings Facilities manager) Signed: 03/01/2021 6:07:52 PM By: Zenaida Deed RN, BSN Entered By: Zenaida Deed on 03/01/2021 09:10:47 -------------------------------------------------------------------------------- Vitals Details Patient Name: Date of Service: Sarah Hose D. 03/01/2021 8:30 A M Medical Record Number: [id number] Patient Account Number: [id number] Date of Birth/Sex: Treating RN: Jul 24, 1941 (80 y.o. [name] Primary Care Myreon Wimer: [name] Other Clinician: Referring Brodyn Depuy: Treating Ramond Darnell/Extender: [name] in Treatment: 15 Vital Signs Time Taken: 08:52 Temperature (F): 98.7 Height (in): 62 Pulse (bpm): 66 Source: Stated Respiratory Rate (breaths/min): 18 Blood Pressure (mmHg): 131/71 Reference  Range: 80 - 120 mg / dl Electronic Signature(s) Signed: 03/01/2021 6:07:52 PM By: Zenaida Deed RN, BSN Entered By: Zenaida Deed on 03/01/2021 09:07:50

## 2021-03-28 ENCOUNTER — Encounter (HOSPITAL_BASED_OUTPATIENT_CLINIC_OR_DEPARTMENT_OTHER): Payer: Medicare Other | Attending: Internal Medicine | Admitting: Internal Medicine

## 2021-03-28 ENCOUNTER — Other Ambulatory Visit: Payer: Self-pay

## 2021-03-28 ENCOUNTER — Other Ambulatory Visit (HOSPITAL_COMMUNITY)
Admission: RE | Admit: 2021-03-28 | Discharge: 2021-03-28 | Disposition: A | Discharge status: Home / Self Care | Payer: Medicare Other | Attending: Internal Medicine | Admitting: Internal Medicine

## 2021-03-28 DIAGNOSIS — F0150 Vascular dementia without behavioral disturbance: Secondary | ICD-10-CM | POA: Diagnosis not present

## 2021-03-28 DIAGNOSIS — I428 Other cardiomyopathies: Secondary | ICD-10-CM | POA: Diagnosis not present

## 2021-03-28 DIAGNOSIS — X58XXXA Exposure to other specified factors, initial encounter: Secondary | ICD-10-CM | POA: Diagnosis not present

## 2021-03-28 DIAGNOSIS — T8131XA Disruption of external operation (surgical) wound, not elsewhere classified, initial encounter: Secondary | ICD-10-CM | POA: Diagnosis not present

## 2021-03-28 DIAGNOSIS — Z8619 Personal history of other infectious and parasitic diseases: Secondary | ICD-10-CM | POA: Diagnosis not present

## 2021-03-28 DIAGNOSIS — L89103 Pressure ulcer of unspecified part of back, stage 3: Secondary | ICD-10-CM | POA: Insufficient documentation

## 2021-03-28 DIAGNOSIS — Z8673 Personal history of transient ischemic attack (TIA), and cerebral infarction without residual deficits: Secondary | ICD-10-CM | POA: Insufficient documentation

## 2021-03-28 DIAGNOSIS — L89323 Pressure ulcer of left buttock, stage 3: Secondary | ICD-10-CM | POA: Insufficient documentation

## 2021-03-28 DIAGNOSIS — Z86711 Personal history of pulmonary embolism: Secondary | ICD-10-CM | POA: Diagnosis not present

## 2021-03-28 DIAGNOSIS — Z86718 Personal history of other venous thrombosis and embolism: Secondary | ICD-10-CM | POA: Insufficient documentation

## 2021-03-28 DIAGNOSIS — I10 Essential (primary) hypertension: Secondary | ICD-10-CM | POA: Diagnosis not present

## 2021-03-28 NOTE — Progress Notes (Signed)
Fine, ANERI SLAGEL ([id number]) Visit Report for 03/28/2021 Arrival Information Details Patient Name: Date of Service: AMONI, SCALLAN 03/28/2021 9:30 A M Medical Record Number: [id number] Patient Account Number: [id number] Date of Birth/Sex: Treating RN: March 04, 1941 (80 y.o. [name] Primary Care Myiesha Edgar: [name] Other Clinician: Referring Aden Youngman: Treating Khalis Hittle/Extender: Burnetta Sabin in Treatment: 19 Visit Information History Since Last Visit Added or deleted any medications: No Patient Arrived: Stretcher Any new allergies or adverse reactions: No Arrival Time: 09:34 Had a fall or experienced change in No Accompanied By: alone activities of daily living that may affect Transfer Assistance: Stretcher risk of falls: Patient Identification Verified: Yes Signs or symptoms of abuse/neglect since last visito No Secondary Verification Process Completed: Yes Hospitalized since last visit: No Patient Requires Transmission-Based Precautions: No Implantable device outside of the clinic excluding No Patient Has Alerts: No cellular tissue based products placed in the center since last visit: Has Dressing in Place as Prescribed: Yes Pain Present Now: No Electronic Signature(s) Signed: 03/28/2021 6:11:36 PM By: [name] RN, BSN Entered By: [name] on 03/28/2021 09:34:38 -------------------------------------------------------------------------------- Clinic Level of Care Assessment Details Patient Name: Date of Service: [name] 03/28/2021 9:30 A M Medical Record Number: [id number] Patient Account Number: [id number] Date of Birth/Sex: Treating RN: Apr 25, 1941 (80 y.o. [name] Primary Care Gedeon Brandow: [name] Other Clinician: Referring Dhillon Comunale: Treating Jeraldine Primeau/Extender: Burnetta Sabin in Treatment: 19 Clinic Level of Care Assessment Items TOOL 4 Quantity Score X- 1 0 Use when  only an EandM is performed on FOLLOW-UP visit ASSESSMENTS - Nursing Assessment / Reassessment X- 1 10 Reassessment of Co-morbidities (includes updates in patient status) X- 1 5 Reassessment of Adherence to Treatment Plan ASSESSMENTS - Wound and Skin A ssessment / Reassessment []  - 0 Simple Wound Assessment / Reassessment - one wound X- 2 5 Complex Wound Assessment / Reassessment - multiple wounds []  - 0 Dermatologic / Skin Assessment (not related to wound area) ASSESSMENTS - Focused Assessment []  - 0 Circumferential Edema Measurements - multi extremities []  - 0 Nutritional Assessment / Counseling / Intervention []  - 0 Lower Extremity Assessment (monofilament, tuning fork, pulses) []  - 0 Peripheral Arterial Disease Assessment (using hand held doppler) ASSESSMENTS - Ostomy and/or Continence Assessment and Care []  - 0 Incontinence Assessment and Management []  - 0 Ostomy Care Assessment and Management (repouching, etc.) PROCESS - Coordination of Care X - Simple Patient / Family Education for ongoing care 1 15 []  - 0 Complex (extensive) Patient / Family Education for ongoing care X- 1 10 Staff obtains Consents, Records, T Results / Process Orders est X- 1 10 Staff telephones HHA, Nursing Homes / Clarify orders / etc []  - 0 Routine Transfer to another Facility (non-emergent condition) []  - 0 Routine Hospital Admission (non-emergent condition) []  - 0 New Admissions / / Ordering NPWT Apligraf, etc. , []  - 0 Emergency Hospital Admission (emergent condition) X- 1 10 Simple Discharge Coordination []  - 0 Complex (extensive) Discharge Coordination PROCESS - Special Needs []  - 0 Pediatric / Minor Patient Management []  - 0 Isolation Patient Management []  - 0 Hearing / Language / Visual special needs []  - 0 Assessment of Community assistance (transportation, D/C planning, etc.) []  - 0 Additional assistance / Altered mentation []  - 0 Support  Surface(s) Assessment (bed, cushion, seat, etc.) INTERVENTIONS - Wound Cleansing / Measurement []  - 0 Simple Wound Cleansing - one wound X- 2 5 Complex Wound Cleansing - multiple wounds X- 1  5 Wound Imaging (photographs - any number of wounds) []  - 0 Wound Tracing (instead of photographs) []  - 0 Simple Wound Measurement - one wound X- 2 5 Complex Wound Measurement - multiple wounds INTERVENTIONS - Wound Dressings X - Small Wound Dressing one or multiple wounds 2 10 []  - 0 Medium Wound Dressing one or multiple wounds []  - 0 Large Wound Dressing one or multiple wounds X- 1 5 Application of Medications - topical []  - 0 Application of Medications - injection INTERVENTIONS - Miscellaneous []  - 0 External ear exam X- 1 5 Specimen Collection (cultures, biopsies, blood, body fluids, etc.) X- 1 5 Specimen(s) / Culture(s) sent or taken to Lab for analysis []  - 0 Patient Transfer (multiple staff / / Similar devices) []  - 0 Simple Staple / Suture removal (25 or less) []  - 0 Complex Staple / Suture removal (26 or more) []  - 0 Hypo / Hyperglycemic Management (close monitor of Blood Glucose) []  - 0 Ankle / Brachial Index (ABI) - do not check if billed separately X- 1 5 Vital Signs Has the patient been seen at the hospital within the last three years: Yes Total Score: 135 Level Of Care: New/Established - Level 4 Electronic Signature(s) Signed: 03/28/2021 6:11:36 PM By: RN, BSN Entered By: on 03/28/2021 10:54:07 -------------------------------------------------------------------------------- Encounter Discharge Information Details Patient Name: Date of Service: D. 03/28/2021 9:30 A M Medical Record Number: Nurse, adult Patient Account Number: Date of Birth/Sex: Treating RN: 1940-11-21 (80 y.o. Primary Care Omaree Fuqua: 03/30/2021 Other Clinician: Referring Thresea Doble: Treating Maitland Lesiak/Extender:  [name] in Treatment: 19 Encounter Discharge Information Items Discharge Condition: Stable Ambulatory Status: Stretcher Discharge Destination: Home Transportation: Private Auto Accompanied By: son Schedule Follow-up Appointment: Yes Clinical Summary of Care: Patient Declined Electronic Signature(s) Signed: 03/28/2021 6:11:36 PM By: Eli Hose RN, BSN Entered By: 03/30/2021 on 03/28/2021 10:55:10 -------------------------------------------------------------------------------- Multi Wound Chart Details Patient Name: Date of Service: [id number] D. 03/28/2021 9:30 A M Medical Record Number: [id number] Patient Account Number: [name] Date of Birth/Sex: Treating RN: 06/06/1941 (80 y.o. 20 Primary Care Riane Rung: 03/30/2021 Other Clinician: Referring Asaiah Scarber: Treating Lorenzo Pereyra/Extender: [name] in Treatment: 19 Vital Signs Height(in): 62 Pulse(bpm): 63 Weight(lbs): Blood Pressure(mmHg): 119/70 Body Mass Index(BMI): Temperature(F): 99.7 Respiratory Rate(breaths/min): 16 Photos: [5:No Photos Left Gluteus] [6:No Photos Midline Back] [N/A:N/A N/A] Wound Location: [5:Gradually Appeared] [6:Surgical Injury] [N/A:N/A] Wounding Event: [5:Pressure Ulcer] [6:Pressure Ulcer] [N/A:N/A] Primary Etiology: [5:Hypertension, Dementia] [6:Hypertension, Dementia] [N/A:N/A] Comorbid History: [5:09/12/2020] [6:09/12/2020] [N/A:N/A] Date Acquired: [5:19] [6:19] [N/A:N/A] Weeks of Treatment: [5:Open] [6:Open] [N/A:N/A] Wound Status: [5:0.7x1x1] [6:3.5x3x0.7] [N/A:N/A] Measurements L x W x D (cm) [5:0.55] [6:8.247] [N/A:N/A] A (cm) : rea [5:0.55] [6:5.773] [N/A:N/A] Volume (cm) : [5:76.70%] [6:53.30%] [N/A:N/A] % Reduction in A rea: [5:76.70%] [6:67.30%] [N/A:N/A] % Reduction in Volume: [5:2] Position 1 (o'clock): [5:6.5] Maximum Distance 1 (cm): [6:6] Starting Position 1 (o'clock): [6:12] Ending Position 1  (o'clock): [6:0.8] Maximum Distance 1 (cm): [5:Yes] [6:No] [N/A:N/A] Tunneling: [5:No] [6:Yes] [N/A:N/A] Undermining: [5:Category/Stage III] [6:Category/Stage III] [N/A:N/A] Classification: [5:Medium] [6:Medium] [N/A:N/A] Exudate A mount: [5:Purulent] [6:Serosanguineous] [N/A:N/A] Exudate Type: [5:yellow, brown, green] [6:red, brown] [N/A:N/A] Exudate Color: [5:Well defined, not attached] [6:Well defined, not attached] [N/A:N/A] Wound Margin: [5:Large (67-100%)] [6:Large (67-100%)] [N/A:N/A] Granulation A mount: [5:Pink] [6:Red] [N/A:N/A] Granulation Quality: [5:None Present (0%)] [6:Small (1-33%)] [N/A:N/A] Necrotic A mount: [5:Fat Layer (Subcutaneous Tissue): Yes Fat Layer (Subcutaneous Tissue): Yes N/A] Exposed Structures: [5:Fascia: No Tendon: No  Muscle: No Joint: No Bone: No None] [6:Fascia: No Tendon: No Muscle: No Joint: No Bone: No None] [N/A:N/A] Treatment Notes Electronic Signature(s) Signed: 03/28/2021 5:36:21 PM By: Baltazar Najjar MD Signed: 03/28/2021 6:11:36 PM By: [name] RN, BSN Entered By: Baltazar Najjar on 03/28/2021 10:19:43 -------------------------------------------------------------------------------- Multi-Disciplinary Care Plan Details Patient Name: Date of Service: [name] 03/28/2021 9:30 A M Medical Record Number: [id number] Patient Account Number: [id number] Date of Birth/Sex: Treating RN: 1941-02-27 (80 y.o. [name] Primary Care Kaizer Dissinger: [name] Other Clinician: Referring Anyeli Hockenbury: Treating Njeri Vicente/Extender: Burnetta Sabin in Treatment: 19 Active Inactive Pressure Nursing Diagnoses: Knowledge deficit related to causes and risk factors for pressure ulcer development Goals: Patient will remain free from development of additional pressure ulcers Date Initiated: 11/10/2020 Target Resolution Date: 04/06/2021 Goal Status: Active Interventions: Assess: immobility, friction, shearing, incontinence  upon admission and as needed Assess offloading mechanisms upon admission and as needed Assess potential for pressure ulcer upon admission and as needed Provide education on pressure ulcers Treatment Activities: Patient referred for home evaluation of offloading devices/mattresses : 11/10/2020 Notes: 12/22/20 Pressure relief ongoing issue. 01/26/21: No new wounds, pressure relief ongoing. Electronic Signature(s) Signed: 03/28/2021 6:11:36 PM By: [name] RN, BSN Entered By: [name] on 03/28/2021 09:47:21 -------------------------------------------------------------------------------- Pain Assessment Details Patient Name: Date of Service: [name] 03/28/2021 9:30 A M Medical Record Number: [id number] Patient Account Number: [id number] Date of Birth/Sex: Treating RN: May 31, 1941 (80 y.o. [name] Primary Care Camaron Cammack: [name] Other Clinician: Referring Milfred Krammes: Treating Kein Carlberg/Extender: Burnetta Sabin in Treatment: 19 Active Problems Location of Pain Severity and Description of Pain Patient Has Paino No Site Locations Pain Management and Medication Current Pain Management: Electronic Signature(s) Signed: 03/28/2021 6:11:36 PM By: [name] RN, BSN Entered By: [name] on 03/28/2021 09:34:59 -------------------------------------------------------------------------------- Patient/Caregiver Education Details Patient Name: Date of Service: [name] 8/17/2022andnbsp9:30 A M Medical Record Number: [id number] Patient Account Number: [id number] Date of Birth/Gender: Treating RN: 07-25-1941 (80 y.o. [name] Primary Care Physician: [name] Other Clinician: Referring Physician: Treating Physician/Extender: Burnetta Sabin in Treatment: 19 Education Assessment Education Provided To: Patient Education Topics Provided Wound/Skin Impairment: Methods:  Explain/Verbal Responses: State content correctly [employer]) Signed: 03/28/2021 6:11:36 PM By: [name] RN, BSN Entered By: [name] on 03/28/2021 09:47:49 -------------------------------------------------------------------------------- Wound Assessment Details Patient Name: Date of Service: [name] 03/28/2021 9:30 A M Medical Record Number: [id number] Patient Account Number: [id number] Date of Birth/Sex: Treating RN: 12/02/1940 (80 y.o. [name] Primary Care Alika Saladin: [name] Other Clinician: Referring Mackie Goon: Treating Emmely Bittinger/Extender: Burnetta Sabin in Treatment: 19 Wound Status Wound Number: 5 Primary Etiology: Pressure Ulcer Wound Location: Left Gluteus Wound Status: Open Wounding Event: Gradually Appeared Comorbid History: Hypertension, Dementia Date Acquired: 09/12/2020 Weeks Of Treatment: 19 Clustered Wound: No Wound Measurements Length: (cm) 0.7 Width: (cm) 1 Depth: (cm) 1 Area: (cm) 0.55 Volume: (cm) 0.55 % Reduction in Area: 76.7% % Reduction in Volume: 76.7% Epithelialization: None Tunneling: Yes Position (o'clock): 2 Maximum Distance: (cm) 6.5 Undermining: No Wound Description Classification: Category/Stage III Wound Margin: Well defined, not attached Exudate Amount: Medium Exudate Type: Purulent Exudate Color: yellow, brown, green Foul Odor After Cleansing: No Slough/Fibrino No Wound Bed Granulation Amount: Large (67-100%) Exposed Structure Granulation Quality: Pink Fascia Exposed: No Necrotic Amount: None Present (0%) Fat Layer (Subcutaneous Tissue) Exposed: Yes Tendon Exposed: No Muscle Exposed: No Joint Exposed: No Bone Exposed: No Treatment Notes Wound #5 (  Gluteus) Wound Laterality: Left Cleanser Wound Cleanser Discharge Instruction: Cleanse the wound with wound cleanser prior to applying a clean dressing using gauze sponges, not tissue or cotton  balls. Peri-Wound Care Topical Primary Dressing KerraCel Ag Gelling Fiber Dressing, 0.75x12 Ribbon (silver alginate) Discharge Instruction: Make sure to lightly pack into tunnel Secondary Dressing Zetuvit Plus Silicone Border Dressing 4x4 (in/in) Discharge Instruction: Apply silicone border or abd pad with tape over primary dressing as directed. Secured With Compression Wrap Compression Stockings Facilities manager) Signed: 03/28/2021 6:11:36 PM By: [name] RN, BSN Entered By: [name] on 03/28/2021 09:47:00 -------------------------------------------------------------------------------- Wound Assessment Details Patient Name: Date of Service: [name] 03/28/2021 9:30 A M Medical Record Number: [id number] Patient Account Number: [id number] Date of Birth/Sex: Treating RN: 1941-03-30 (80 y.o. [name] Primary Care Lior Cartelli: [name] Other Clinician: Referring Nury Nebergall: Treating Jaidan Prevette/Extender: Burnetta Sabin in Treatment: 19 Wound Status Wound Number: 6 Primary Etiology: Pressure Ulcer Wound Location: Midline Back Wound Status: Open Wounding Event: Surgical Injury Comorbid History: Hypertension, Dementia Date Acquired: 09/12/2020 Weeks Of Treatment: 19 Clustered Wound: No Wound Measurements Length: (cm) 3.5 Width: (cm) 3 Depth: (cm) 0.7 Area: (cm) 8.247 Volume: (cm) 5.773 % Reduction in Area: 53.3% % Reduction in Volume: 67.3% Epithelialization: None Tunneling: No Undermining: Yes Starting Position (o'clock): 6 Ending Position (o'clock): 12 Maximum Distance: (cm) 0.8 Wound Description Classification: Category/Stage III Wound Margin: Well defined, not attached Exudate Amount: Medium Exudate Type: Serosanguineous Exudate Color: red, brown Foul Odor After Cleansing: No Slough/Fibrino Yes Wound Bed Granulation Amount: Large (67-100%) Exposed Structure Granulation Quality: Red Fascia  Exposed: No Necrotic Amount: Small (1-33%) Fat Layer (Subcutaneous Tissue) Exposed: Yes Necrotic Quality: Adherent Slough Tendon Exposed: No Muscle Exposed: No Joint Exposed: No Bone Exposed: No Treatment Notes Wound #6 (Back) Wound Laterality: Midline Cleanser Peri-Wound Care Skin Prep Discharge Instruction: Use skin prep as directed Topical Primary Dressing Promogran Prisma Matrix, 4.34 (sq in) (silver collagen) Discharge Instruction: Apply under wound vac foam, moisten collagen with saline or hydrogel wet to dry dressing in clinic only Secondary Dressing Zetuvit Plus Silicone Border Dressing 4x4 (in/in) Discharge Instruction: Apply silicone border over primary dressing as directed. Secured With Compression Wrap Compression Stockings Facilities manager) Signed: 03/28/2021 6:11:36 PM By: [name] RN, BSN Entered By: [name] on 03/28/2021 09:46:30 -------------------------------------------------------------------------------- Vitals Details Patient Name: Date of Service: [name] 03/28/2021 9:30 A M Medical Record Number: [id number] Patient Account Number: [id number] Date of Birth/Sex: Treating RN: 09/29/1940 (80 y.o. [name] Primary Care Tiffane Sheldon: [name] Other Clinician: Referring Jaionna Weisse: Treating Ron Beske/Extender: Burnetta Sabin in Treatment: 19 Vital Signs Time Taken: 09:34 Temperature (F): 99.7 Height (in): 62 Pulse (bpm): 63 Respiratory Rate (breaths/min): 16 Blood Pressure (mmHg): 119/70 Reference Range: 80 - 120 mg / dl Electronic Signature(s) Signed: 03/28/2021 6:11:36 PM By: [name] RN, BSN Entered By: [name] on 03/28/2021 09:34:54

## 2021-03-28 NOTE — Progress Notes (Signed)
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 Promogran Prisma Matrix, 4.34 (sq in) (silver collagen) 3 x Per Week/15 Days ary Discharge Instructions: Apply under wound vac foam, moisten collagen with saline or hydrogel Prim Dressing: wet to dry dressing in clinic only 3 x Per Week/15 Days ary Secondary Dressing: Zetuvit Plus Silicone Border Dressing 4x4 (in/in) 3 x Per Week/15 Days Discharge Instructions: Apply silicone border over primary dressing as directed. #1 I change the primary dressing to the left buttock to silver alginate packing strip. 2. Culture done of this area because of the nurses description of purulent drainage. I am going to give her empiric cephalexin suspension 3. Continue the wound VAC with underlying silver collagen 4. Follow-up in 2 weeks. If the culture I did of the buttock wound dictates I will change the antibiotic. For now no imaging studies 5. Her son reports that he is actually feeding her and when he does this he reduces the amount of tube feeding. It does not appear that she actually is aspirating by his description Electronic Signature(s) Signed: 03/28/2021 10:30:06 AM By: Baltazar Najjar MD Entered By: Baltazar Najjar on 03/28/2021 10:30:06 -------------------------------------------------------------------------------- SuperBill  Details Patient Name: Date of Service: Sarah Hose D. 03/28/2021 Medical Record Number: [id number] Patient Account Number: [id number] Date of Birth/Sex: Treating RN: 03-03-1941 (80 y.o. [name] Primary Care Provider: [name] Other Clinician: Referring Provider: Treating Provider/Extender: Burnetta Sabin in Treatment: 19 Diagnosis Coding ICD-10 Codes Code Description L89.103 Pressure ulcer of unspecified part of back, stage 3 L89.323 Pressure ulcer of left buttock, stage 3 T81.31XA Disruption of external operation (surgical) wound, not elsewhere classified, initial encounter Facility Procedures CPT4 Code: [id number] Description: 99214 - WOUND CARE VISIT-LEV 4 EST PT Modifier: Quantity: 1 Physician Procedures : CPT4 Code Description Modifier [id number] 99214 - WC PHYS LEVEL 4 - EST PT ICD-10 Diagnosis Description L89.323 Pressure ulcer of left buttock, stage 3 L89.103 Pressure ulcer of unspecified part of back, stage 3 T81.31XA Disruption of external operation  (surgical) wound, not elsewhere classified, initial encounter Quantity: 1 Electronic Signature(s) Signed: 03/28/2021 5:36:21 PM By: Baltazar Najjar MD Signed: 03/28/2021 6:11:36 PM By: [name] RN, BSN Entered By: [name] on 03/28/2021 10:54:13

## 2021-03-29 ENCOUNTER — Encounter (HOSPITAL_BASED_OUTPATIENT_CLINIC_OR_DEPARTMENT_OTHER): Payer: Medicare Other | Admitting: Internal Medicine

## 2021-03-31 LAB — AEROBIC CULTURE W GRAM STAIN (SUPERFICIAL SPECIMEN)

## 2021-04-12 ENCOUNTER — Other Ambulatory Visit: Payer: Self-pay

## 2021-04-12 ENCOUNTER — Encounter (HOSPITAL_BASED_OUTPATIENT_CLINIC_OR_DEPARTMENT_OTHER): Payer: Medicare Other | Attending: Internal Medicine | Admitting: Internal Medicine

## 2021-04-12 DIAGNOSIS — L89323 Pressure ulcer of left buttock, stage 3: Secondary | ICD-10-CM | POA: Diagnosis not present

## 2021-04-12 DIAGNOSIS — Z7901 Long term (current) use of anticoagulants: Secondary | ICD-10-CM | POA: Diagnosis not present

## 2021-04-12 DIAGNOSIS — Z86718 Personal history of other venous thrombosis and embolism: Secondary | ICD-10-CM | POA: Diagnosis not present

## 2021-04-12 DIAGNOSIS — T8131XA Disruption of external operation (surgical) wound, not elsewhere classified, initial encounter: Secondary | ICD-10-CM | POA: Insufficient documentation

## 2021-04-12 DIAGNOSIS — Z885 Allergy status to narcotic agent status: Secondary | ICD-10-CM | POA: Insufficient documentation

## 2021-04-12 DIAGNOSIS — Y838 Other surgical procedures as the cause of abnormal reaction of the patient, or of later complication, without mention of misadventure at the time of the procedure: Secondary | ICD-10-CM | POA: Insufficient documentation

## 2021-04-12 DIAGNOSIS — L89103 Pressure ulcer of unspecified part of back, stage 3: Secondary | ICD-10-CM | POA: Diagnosis present

## 2021-04-12 DIAGNOSIS — F0150 Vascular dementia without behavioral disturbance: Secondary | ICD-10-CM | POA: Diagnosis not present
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
## 2021-04-12 NOTE — Progress Notes (Signed)
Piehl, [name] ([id number]) Visit Report for 04/12/2021 Arrival Information Details Patient Name: Date of Service: [name], [name] 04/12/2021 8:45 A M Medical Record Number: [id number] Patient Account Number: [id number] Date of Birth/Sex: Treating RN: 10/20/1940 (80 y.o. [name] Primary Care : Nolene Ebbs Other Clinician: Referring : Treating /Extender: Lenard Galloway in Treatment: 21 Visit Information History Since Last Visit Added or deleted any medications: No Patient Arrived: Stretcher Any new allergies or adverse reactions: No Arrival Time: 08:38 Had a fall or experienced change in No Accompanied By: EMS activities of daily living that may affect Transfer Assistance: Stretcher risk of falls: Patient Identification Verified: Yes Signs or symptoms of abuse/neglect since last visito No Secondary Verification Process Completed: Yes Hospitalized since last visit: No Patient Requires Transmission-Based Precautions: No Implantable device outside of the clinic excluding No Patient Has Alerts: No cellular tissue based products placed in the center since last visit: Has Dressing in Place as Prescribed: Yes Pain Present Now: Yes Electronic Signature(s) Signed: 04/12/2021 5:16:40 PM By: Baruch Gouty RN, BSN Entered By: Baruch Gouty on 04/12/2021 08:39:01 -------------------------------------------------------------------------------- Clinic Level of Care Assessment Details Patient Name: Date of Service: [name], Sarah 04/12/2021 8:45 A M Medical Record Number: [id number] Patient Account Number: [id number] Date of Birth/Sex: Treating RN: 15-Nov-1940 (80 y.o. Nancy Fetter Primary Care : Nolene Ebbs Other Clinician: Referring : Treating /Extender: Lenard Galloway in Treatment: 21 Clinic Level of Care Assessment Items TOOL 4 Quantity Score X- 1 0 Use when only  an EandM is performed on FOLLOW-UP visit ASSESSMENTS - Nursing Assessment / Reassessment X- 1 10 Reassessment of Co-morbidities (includes updates in patient status) X- 1 5 Reassessment of Adherence to Treatment Plan ASSESSMENTS - Wound and Skin A ssessment / Reassessment [] - 0 Simple Wound Assessment / Reassessment - one wound X- 2 5 Complex Wound Assessment / Reassessment - multiple wounds [] - 0 Dermatologic / Skin Assessment (not related to wound area) ASSESSMENTS - Focused Assessment [] - 0 Circumferential Edema Measurements - multi extremities [] - 0 Nutritional Assessment / Counseling / Intervention [] - 0 Lower Extremity Assessment (monofilament, tuning fork, pulses) [] - 0 Peripheral Arterial Disease Assessment (using hand held doppler) ASSESSMENTS - Ostomy and/or Continence Assessment and Care [] - 0 Incontinence Assessment and Management [] - 0 Ostomy Care Assessment and Management (repouching, etc.) PROCESS - Coordination of Care X - Simple Patient / Family Education for ongoing care 1 15 [] - 0 Complex (extensive) Patient / Family Education for ongoing care X- 1 10 Staff obtains Programmer, systems, Records, T Results / Process Orders est X- 1 10 Staff telephones HHA, Nursing Homes / Clarify orders / etc [] - 0 Routine Transfer to another Facility (non-emergent condition) [] - 0 Routine Hospital Admission (non-emergent condition) [] - 0 New Admissions / Biomedical engineer / Ordering NPWT Apligraf, etc. , [] - 0 Emergency Hospital Admission (emergent condition) X- 1 10 Simple Discharge Coordination [] - 0 Complex (extensive) Discharge Coordination PROCESS - Special Needs [] - 0 Pediatric / Minor Patient Management [] - 0 Isolation Patient Management [] - 0 Hearing / Language / Visual special needs [] - 0 Assessment of Community assistance (transportation, D/C planning, etc.) [] - 0 Additional assistance / Altered mentation [] - 0 Support Surface(s)  Assessment (bed, cushion, seat, etc.) INTERVENTIONS - Wound Cleansing / Measurement [] - 0 Simple Wound Cleansing - one wound X- 2 5 Complex Wound Cleansing - multiple wounds X- 1  5 Wound Imaging (photographs - any number of wounds) [] - 0 Wound Tracing (instead of photographs) [] - 0 Simple Wound Measurement - one wound X- 2 5 Complex Wound Measurement - multiple wounds INTERVENTIONS - Wound Dressings X - Small Wound Dressing one or multiple wounds 2 10 [] - 0 Medium Wound Dressing one or multiple wounds [] - 0 Large Wound Dressing one or multiple wounds [] - 0 Application of Medications - topical [] - 0 Application of Medications - injection INTERVENTIONS - Miscellaneous [] - 0 External ear exam [] - 0 Specimen Collection (cultures, biopsies, blood, body fluids, etc.) [] - 0 Specimen(s) / Culture(s) sent or taken to Lab for analysis [] - 0 Patient Transfer (multiple staff / Civil Service fast streamer / Similar devices) [] - 0 Simple Staple / Suture removal (25 or less) [] - 0 Complex Staple / Suture removal (26 or more) [] - 0 Hypo / Hyperglycemic Management (close monitor of Blood Glucose) [] - 0 Ankle / Brachial Index (ABI) - do not check if billed separately X- 1 5 Vital Signs Has the patient been seen at the hospital within the last three years: Yes Total Score: 120 Level Of Care: New/Established - Level 4 Electronic Signature(s) Signed: 04/12/2021 5:19:32 PM By: Levan Hurst RN, BSN Entered By: Levan Hurst on 04/12/2021 11:52:20 -------------------------------------------------------------------------------- Encounter Discharge Information Details Patient Name: Date of Service: Sarah Seltzer D. 04/12/2021 8:45 A M Medical Record Number: [id number] Patient Account Number: [id number] Date of Birth/Sex: Treating RN: [birth year] (80 y.o. Nancy Fetter Primary Care : Nolene Ebbs Other Clinician: Referring : Treating /Extender: Lenard Galloway in Treatment: 21 Encounter Discharge Information Items Discharge Condition: Stable Ambulatory Status: Stretcher Discharge Destination: Home Transportation: Private Auto Accompanied By: son Schedule Follow-up Appointment: Yes Clinical Summary of Care: Patient Declined Electronic Signature(s) Signed: 04/12/2021 5:19:32 PM By: Levan Hurst RN, BSN Entered By: Levan Hurst on 04/12/2021 11:53:08 -------------------------------------------------------------------------------- Lower Extremity Assessment Details Patient Name: Date of Service: Sarah Seltzer D. 04/12/2021 8:45 A M Medical Record Number: [id number] Patient Account Number: [id number] Date of Birth/Sex: Treating RN: 1940-09-14 (80 y.o. [name] Primary Care : Nolene Ebbs Other Clinician: Referring : Treating /Extender: Lenard Galloway in Treatment: 21 Electronic Signature(s) Signed: 04/12/2021 5:16:40 PM By: Baruch Gouty RN, BSN Entered By: Baruch Gouty on 04/12/2021 08:40:26 -------------------------------------------------------------------------------- Multi Wound Chart Details Patient Name: Date of Service: Sarah Seltzer D. 04/12/2021 8:45 A M Medical Record Number: [id number] Patient Account Number: [id number] Date of Birth/Sex: Treating RN: 1941/03/19 (80 y.o. Debby Bud Primary Care : Nolene Ebbs Other Clinician: Referring : Treating /Extender: Lenard Galloway in Treatment: 21 Vital Signs Height(in): 79 Pulse(bpm): 55 Weight(lbs): Blood Pressure(mmHg): 148/69 Body Mass Index(BMI): Temperature(F): 98.5 Respiratory Rate(breaths/min): 18 Photos: [N/A:N/A] Left Gluteus Midline Back N/A Wound Location: Gradually Appeared Surgical Injury N/A Wounding Event: Pressure Ulcer Pressure Ulcer N/A Primary Etiology: Hypertension, Dementia Hypertension, Dementia  N/A Comorbid History: 09/12/2020 09/12/2020 N/A Date Acquired: 21 21 N/A Weeks of Treatment: Open Open N/A Wound Status: 1x0.8x0.7 2.9x2.9x0.8 N/A Measurements L x W x D (cm) 0.628 6.605 N/A A (cm) : rea 0.44 5.284 N/A Volume (cm) : 73.30% 62.60% N/A % Reduction in A rea: 81.30% 70.10% N/A % Reduction in Volume: 3 Position 1 (o'clock): 6.3 Maximum Distance 1 (cm): 12 Starting Position 1 (o'clock): 12 Ending Position 1 (o'clock): 1 Maximum Distance 1 (cm): Yes No N/A Tunneling: No Yes N/A Undermining: Category/Stage III Category/Stage III N/A Classification:  Medium Medium N/A Exudate A mount: Purulent Serosanguineous N/A Exudate Type: yellow, brown, green red, brown N/A Exudate Color: Well defined, not attached Well defined, not attached N/A Wound Margin: Large (67-100%) Large (67-100%) N/A Granulation A mount: Red, Hyper-granulation Red, Pink N/A Granulation Quality: None Present (0%) Small (1-33%) N/A Necrotic A mount: Fat Layer (Subcutaneous Tissue): Yes Fat Layer (Subcutaneous Tissue): Yes N/A Exposed Structures: Fascia: No Fascia: No Tendon: No Tendon: No Muscle: No Muscle: No Joint: No Joint: No Bone: No Bone: No None None N/A Epithelialization: Treatment Notes Electronic Signature(s) Signed: 04/12/2021 5:31:22 PM By: Linton Ham MD Signed: 04/12/2021 5:36:38 PM By: Deon Pilling Entered By: Linton Ham on 04/12/2021 10:49:16 -------------------------------------------------------------------------------- Multi-Disciplinary Care Plan Details Patient Name: Date of Service: Sarah Seltzer D. 04/12/2021 8:45 A M Medical Record Number: [id number] Patient Account Number: [id number] Date of Birth/Sex: Treating RN: [birth year] (80 y.o. Nancy Fetter Primary Care : Nolene Ebbs Other Clinician: Referring : Treating /Extender: Lenard Galloway in Treatment: 21 Active  Inactive Pressure Nursing Diagnoses: Knowledge deficit related to causes and risk factors for pressure ulcer development Goals: Patient will remain free from development of additional pressure ulcers Date Initiated: 11/10/2020 Target Resolution Date: 05/11/2021 Goal Status: Active Interventions: Assess: immobility, friction, shearing, incontinence upon admission and as needed Assess offloading mechanisms upon admission and as needed Assess potential for pressure ulcer upon admission and as needed Provide education on pressure ulcers Treatment Activities: Patient referred for home evaluation of offloading devices/mattresses : 11/10/2020 Notes: 12/22/20 Pressure relief ongoing issue. 01/26/21: No new wounds, pressure relief ongoing. Wound/Skin Impairment Nursing Diagnoses: Impaired tissue integrity Goals: Patient/caregiver will verbalize understanding of skin care regimen Date Initiated: 11/10/2020 Target Resolution Date: 05/11/2021 Goal Status: Active Ulcer/skin breakdown will have a volume reduction of 30% by week 4 Date Initiated: 11/10/2020 Date Inactivated: 12/22/2020 Target Resolution Date: 12/08/2020 Goal Status: Met Ulcer/skin breakdown will have a volume reduction of 50% by week 8 Date Initiated: 12/22/2020 Date Inactivated: 01/26/2021 Target Resolution Date: 01/12/2021 Goal Status: Met Ulcer/skin breakdown will have a volume reduction of 80% by week 12 Date Initiated: 01/26/2021 Date Inactivated: 03/01/2021 Target Resolution Date: 02/23/2021 Unmet Reason: see wound Goal Status: Unmet measurements. Interventions: Assess patient/caregiver ability to obtain necessary supplies Assess patient/caregiver ability to perform ulcer/skin care regimen upon admission and as needed Assess ulceration(s) every visit Provide education on ulcer and skin care Treatment Activities: Skin care regimen initiated : 11/10/2020 Topical wound management initiated : 11/10/2020 Notes: 01/26/21: Wounds greater  than 50% volume reduction, new goal initiated. Electronic Signature(s) Signed: 04/12/2021 5:19:32 PM By: Levan Hurst RN, BSN Entered By: Levan Hurst on 04/12/2021 11:47:59 -------------------------------------------------------------------------------- Pain Assessment Details Patient Name: Date of Service: Sarah Seltzer D. 04/12/2021 8:45 A M Medical Record Number: [id number] Patient Account Number: [id number] Date of Birth/Sex: Treating RN: 07/19/1941 (80 y.o. [name] Primary Care : Nolene Ebbs Other Clinician: Referring : Treating /Extender: Lenard Galloway in Treatment: 21 Active Problems Location of Pain Severity and Description of Pain Patient Has Paino Yes Site Locations Pain Location: Generalized Pain, Pain in Ulcers With Dressing Change: Yes Duration of the Pain. Constant / Intermittento Intermittent Rate the pain. Current Pain Level: 0 Worst Pain Level: 5 Least Pain Level: 0 Character of Pain Describe the Pain: Aching Pain Management and Medication Current Pain Management: Medication: Yes Other: reposition Is the Current Pain Management Adequate: Adequate How does your wound impact your activities of daily livingo Sleep: No Bathing: No Appetite: No Relationship With Others:  No Bladder Continence: No Emotions: Yes Bowel Continence: No Work: No Toileting: No Drive: No Dressing: No Hobbies: No Electronic Signature(s) Signed: 04/12/2021 5:16:40 PM By: Baruch Gouty RN, BSN Entered By: Baruch Gouty on 04/12/2021 08:40:19 -------------------------------------------------------------------------------- Patient/Caregiver Education Details Patient Name: Date of Service: [name] 9/1/2022andnbsp8:45 A M Medical Record Number: [id number] Patient Account Number: [id number] Date of Birth/Gender: Treating RN: Aug 27, 1940 (80 y.o. Nancy Fetter Primary Care Physician: Nolene Ebbs Other Clinician: Referring Physician: Treating Physician/Extender: Lenard Galloway in Treatment: 21 Education Assessment Education Provided To: Patient Education Topics Provided Wound/Skin Impairment: Methods: Explain/Verbal Responses: State content correctly [employer]) Signed: 04/12/2021 5:19:32 PM By: Levan Hurst RN, BSN Entered By: Levan Hurst on 04/12/2021 11:48:50 -------------------------------------------------------------------------------- Wound Assessment Details Patient Name: Date of Service: Sarah Seltzer D. 04/12/2021 8:45 A M Medical Record Number: [id number] Patient Account Number: [id number] Date of Birth/Sex: Treating RN: [birth year] (80 y.o. Helene Shoe, Meta.Reding Primary Care Tamiyah Moulin: Nolene Ebbs Other Clinician: Referring Myanna Ziesmer: Treating Chesney Suares/Extender: Lenard Galloway in Treatment: 21 Wound Status Wound Number: 5 Primary Etiology: Pressure Ulcer Wound Location: Left Gluteus Wound Status: Open Wounding Event: Gradually Appeared Comorbid History: Hypertension, Dementia Date Acquired: 09/12/2020 Weeks Of Treatment: 21 Clustered Wound: No Photos Wound Measurements Length: (cm) 1 Width: (cm) 0.8 Depth: (cm) 0.7 Area: (cm) 0.628 Volume: (cm) 0.44 % Reduction in Area: 73.3% % Reduction in Volume: 81.3% Epithelialization: None Tunneling: Yes Position (o'clock): 3 Maximum Distance: (cm) 6.3 Undermining: No Wound Description Classification: Category/Stage III Wound Margin: Well defined, not attached Exudate Amount: Medium Exudate Type: Purulent Exudate Color: yellow, brown, green Foul Odor After Cleansing: No Slough/Fibrino No Wound Bed Granulation Amount: Large (67-100%) Exposed Structure Granulation Quality: Red, Hyper-granulation Fascia Exposed: No Necrotic Amount: None Present (0%) Fat Layer (Subcutaneous Tissue) Exposed: Yes Tendon Exposed: No Muscle Exposed:  No Joint Exposed: No Bone Exposed: No Treatment Notes Wound #5 (Gluteus) Wound Laterality: Left Cleanser Wound Cleanser Discharge Instruction: Cleanse the wound with wound cleanser prior to applying a clean dressing using gauze sponges, not tissue or cotton balls. Peri-Wound Care Topical Gentamicin Discharge Instruction: Coat silver alginate with Gentamicin prior to packing. Primary Dressing KerraCel Ag Gelling Fiber Dressing, 0.75x12 Ribbon (silver alginate) Discharge Instruction: Make sure to lightly pack into tunnel Secondary Dressing Zetuvit Plus Silicone Border Dressing 4x4 (in/in) Discharge Instruction: Apply silicone border or abd pad with tape over primary dressing as directed. Secured With Compression Wrap Compression Stockings Environmental education officer) Signed: 04/12/2021 5:16:40 PM By: Baruch Gouty RN, BSN Signed: 04/12/2021 5:36:38 PM By: Deon Pilling Entered By: Baruch Gouty on 04/12/2021 08:44:55 -------------------------------------------------------------------------------- Wound Assessment Details Patient Name: Date of Service: Sarah Seltzer D. 04/12/2021 8:45 A M Medical Record Number: [id number] Patient Account Number: [id number] Date of Birth/Sex: Treating RN: 1941-04-21 (80 y.o. Helene Shoe, Meta.Reding Primary Care Aitan Rossbach: Nolene Ebbs Other Clinician: Referring Trindon Dorton: Treating Ladrea Holladay/Extender: Lenard Galloway in Treatment: 21 Wound Status Wound Number: 6 Primary Etiology: Pressure Ulcer Wound Location: Midline Back Wound Status: Open Wounding Event: Surgical Injury Comorbid History: Hypertension, Dementia Date Acquired: 09/12/2020 Weeks Of Treatment: 21 Clustered Wound: No Photos Wound Measurements Length: (cm) 2.9 Width: (cm) 2.9 Depth: (cm) 0.8 Area: (cm) 6.605 Volume: (cm) 5.284 % Reduction in Area: 62.6% % Reduction in Volume: 70.1% Epithelialization: None Tunneling: No Undermining: Yes Starting  Position (o'clock): 12 Ending Position (o'clock): 12 Maximum Distance: (cm) 1 Wound Description Classification: Category/Stage III Wound Margin: Well defined, not attached Exudate Amount: Medium Exudate Type: Serosanguineous Exudate Color:  red, brown Foul Odor After Cleansing: No Slough/Fibrino Yes Wound Bed Granulation Amount: Large (67-100%) Exposed Structure Granulation Quality: Red, Pink Fascia Exposed: No Necrotic Amount: Small (1-33%) Fat Layer (Subcutaneous Tissue) Exposed: Yes Necrotic Quality: Adherent Slough Tendon Exposed: No Muscle Exposed: No Joint Exposed: No Bone Exposed: No Treatment Notes Wound #6 (Back) Wound Laterality: Midline Cleanser Peri-Wound Care Topical Primary Dressing Promogran Prisma Matrix, 4.34 (sq in) (silver collagen) Discharge Instruction: Apply under wound vac foam, moisten collagen with saline or hydrogel wet to dry dressing in clinic only Secondary Dressing Zetuvit Plus Silicone Border Dressing 4x4 (in/in) Discharge Instruction: Apply silicone border over primary dressing as directed. Secured With Compression Wrap Compression Stockings Environmental education officer) Signed: 04/12/2021 5:16:40 PM By: Baruch Gouty RN, BSN Signed: 04/12/2021 5:36:38 PM By: Deon Pilling Entered By: Baruch Gouty on 04/12/2021 08:45:21 -------------------------------------------------------------------------------- Vitals Details Patient Name: Date of Service: Sarah Seltzer D. 04/12/2021 8:45 A M Medical Record Number: [id number] Patient Account Number: [id number] Date of Birth/Sex: Treating RN: 12/17/1940 (80 y.o. [name] Primary Care : Nolene Ebbs Other Clinician: Referring : Treating /Extender: Lenard Galloway in Treatment: 21 Vital Signs Time Taken: 08:39 Temperature (F): 98.5 Height (in): 62 Pulse (bpm): 66 Source: Stated Respiratory Rate (breaths/min): 18 Blood Pressure  (mmHg): 148/69 Reference Range: 80 - 120 mg / dl Electronic Signature(s) Signed: 04/12/2021 5:16:40 PM By: Baruch Gouty RN, BSN Entered By: Baruch Gouty on 04/12/2021 08:39:33

## 2021-05-03 ENCOUNTER — Encounter (HOSPITAL_BASED_OUTPATIENT_CLINIC_OR_DEPARTMENT_OTHER): Payer: Medicare Other | Admitting: Internal Medicine

## 2021-05-09 ENCOUNTER — Other Ambulatory Visit: Payer: Self-pay

## 2021-05-09 ENCOUNTER — Encounter (HOSPITAL_BASED_OUTPATIENT_CLINIC_OR_DEPARTMENT_OTHER): Payer: Medicare Other | Admitting: Internal Medicine

## 2021-05-09 DIAGNOSIS — L89103 Pressure ulcer of unspecified part of back, stage 3: Secondary | ICD-10-CM | POA: Diagnosis not present

## 2021-05-10 NOTE — Progress Notes (Signed)
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 part of back, stage 3 Pressure ulcer of left buttock, stage 3 Disruption of external operation (surgical) wound, not elsewhere classified, initial encounter Plan Follow-up Appointments: Return Appointment in 2 weeks. - Tele-visit 10/12 @ 11:00 am Negative Presssure Wound Therapy: Wound #6 Midline Back: Other: - Place vac on hold until next visit Off-Loading: Low air-loss mattress (Group 2) Turn and reposition every 2 hours Additional Orders / Instructions: Follow Nutritious Diet - Continue ProStat Home Health: New wound care orders this week; continue Home Health for wound care. May utilize formulary equivalent dressing for wound treatment orders unless otherwise specified. - STAT wound culture of back wound 05/10/21 - fax results to [phone]. Hold vac for the next 2 weeks, apply silver alginate to both wounds. Other Home Health  Orders/Instructions: - [facility] for Wound Care 3x week. Laboratory ordered were: Anerobic culture - Midline back - *STAT* - to be obtained by home health 05/10/21. Fax results to [phone]. WOUND #5: - Gluteus Wound Laterality: Left Cleanser: Wound Cleanser (Home Health) 3 x Per Week/15 Days Discharge Instructions: Cleanse the wound with wound cleanser prior to applying a clean dressing using gauze sponges, not tissue or cotton balls. Topical: Gentamicin 3 x Per Week/15 Days Discharge Instructions: Coat silver alginate with Gentamicin prior to packing. Prim Dressing: KerraCel Ag Gelling Fiber Dressing, 0.75x12 Ribbon (silver alginate) (Home Health) 3 x Per Week/15 Days ary Discharge Instructions: Make sure to lightly pack into tunnel Secondary Dressing: Zetuvit Plus Silicone Border Dressing 4x4 (in/in) (Home Health) 3 x Per Week/15 Days Discharge Instructions: Apply silicone border or abd pad with tape over primary dressing as directed. WOUND #6: -  Back Wound Laterality: Midline Cleanser: Wound Cleanser (Home Health) 3 x Per Week/15 Days Discharge Instructions: Cleanse the wound with wound cleanser prior to applying a clean dressing using gauze sponges, not tissue or cotton balls. Prim Dressing: KerraCel Ag Gelling Fiber Dressing, 0.75x12 Ribbon (silver alginate) (Home Health) 3 x Per Week/15 Days ary Discharge Instructions: Make sure to lightly pack into tunnel Secondary Dressing: Zetuvit Plus Silicone Border Dressing 4x4 (in/in) (Home Health) 3 x Per Week/15 Days Discharge Instructions: Apply silicone border or abd pad with tape over primary dressing as directed. 1. We put the wound VAC on hold. We will use silver alginate in the wound on her back. I asked the home health nurse to come back and do a swab culture on this area. Consider systemic antibiotics based on this. Fortunately this did not seem to be grossly infected and there was no exposed bone. Nevertheless  the circumferential tenderness was concerning 2. The area on her left buttock is down to 5.7 cm of depth this is a continued gradual improvement. I did not change her dressing here. 3. The patient is not transportable to clinic because of cost. Apparently they only cover a certain number of transports. We will therefore continue to help by seeing the patient virtually again in 2 weeks. Electronic Signature(s) Signed: 05/09/2021 5:00:15 PM By: Baltazar Najjar MD Entered By: Baltazar Najjar on 05/09/2021 11:22:02 -------------------------------------------------------------------------------- SuperBill Details Patient Name: Date of Service: [name] 05/09/2021 Medical Record Number: [id number] Patient Account Number: [id number] Date of Birth/Sex: Treating RN: [birth year] (80 y.o. F) Primary Care Provider: [name] Other Clinician: Referring Provider: Treating Provider/Extender: Burnetta Sabin in Treatment: 25 Diagnosis Coding ICD-10 Codes Code Description L89.103 Pressure ulcer of unspecified part of back, stage 3 L89.323 Pressure ulcer of left buttock, stage 3 T81.31XA Disruption of external operation (surgical) wound, not elsewhere classified, initial encounter Facility Procedures CPT4 Code: [id number] Description: Telehealth originating site facility fee. Modifier: Quantity: 1 Physician Procedures : CPT4 Code Description Modifier [id number] - WC PHYS LEVEL 3 - EST PT ICD-10 Diagnosis Description L89.103 Pressure ulcer of unspecified part of back, stage 3 L89.323 Pressure ulcer of left buttock, stage 3 T81.31XA Disruption of external operation  (surgical) wound, not elsewhere classified, initial encounter Quantity: 1 Electronic Signature(s) Signed: 05/09/2021 5:00:15 PM By: Baltazar Najjar MD Signed: 05/10/2021 4:49:13 PM By: [name] RN, BSN Entered By: [name] on 05/09/2021 11:25:03

## 2021-05-10 NOTE — Progress Notes (Signed)
Kennerly, BICH MCHANEY ([id number]) Visit Report for 05/09/2021 Wound Assessment Details Patient Name: Date of Service: [name], [name] 05/09/2021 11:00 A M Medical Record Number: [id number] Patient Account Number: [id number] Date of Birth/Sex: Treating RN: June 27, 1941 (80 y.o. [name] Primary Care Ean Gettel: [name] Other Clinician: Referring Tayla Panozzo: Treating Aizlynn Digilio/Extender: Burnetta Sabin in Treatment: 25 Wound Status Wound Number: 5 Primary Etiology: Pressure Ulcer Wound Location: Left Gluteus Wound Status: Open Wounding Event: Gradually Appeared Comorbid History: Hypertension, Dementia Date Acquired: 09/12/2020 Weeks Of Treatment: 25 Clustered Wound: No Wound Measurements Length: (cm) 1 % Reduction in Area: 73.3% Width: (cm) 0.8 % Reduction in Volume: 81.3% Depth: (cm) 0.7 Epithelialization: None Area: (cm) 0.628 Tunneling: Yes Volume: (cm) 0.44 Position (o'clock): 3 Maximum Distance: (cm) 6.3 Undermining: No Wound Description Classification: Category/Stage III Foul Odor After Cleansing: No Wound Margin: Well defined, not attached Slough/Fibrino No Exudate Amount: Medium Exudate Type: Purulent Exudate Color: yellow, brown, green Wound Bed Granulation Amount: Large (67-100%) Exposed Structure Granulation Quality: Red, Hyper-granulation Fascia Exposed: No Necrotic Amount: None Present (0%) Fat Layer (Subcutaneous Tissue) Exposed: Yes Tendon Exposed: No Muscle Exposed: No Joint Exposed: No Bone Exposed: No Electronic Signature(s) Signed: 05/10/2021 4:49:13 PM By: [name] RN, BSN Entered By: [name] on 05/09/2021 11:12:20 -------------------------------------------------------------------------------- Wound Assessment Details Patient Name: Date of Service: [name] 05/09/2021 11:00 A M Medical Record Number: [id number] Patient Account Number: [id number] Date of Birth/Sex: Treating RN: 1941/06/05  (80 y.o. [name] Primary Care Brad Lieurance: [name] Other Clinician: Referring Defne Gerling: Treating Maxximus Gotay/Extender: Burnetta Sabin in Treatment: 25 Wound Status Wound Number: 6 Primary Etiology: Pressure Ulcer Wound Location: Midline Back Wound Status: Open Wounding Event: Surgical Injury Comorbid History: Hypertension, Dementia Date Acquired: 09/12/2020 Weeks Of Treatment: 25 Clustered Wound: No Wound Measurements Length: (cm) 2.9 Width: (cm) 2.9 Depth: (cm) 0.8 Area: (cm) 6.605 Volume: (cm) 5.284 % Reduction in Area: 62.6% % Reduction in Volume: 70.1% Epithelialization: None Tunneling: No Undermining: Yes Starting Position (o'clock): 12 Ending Position (o'clock): 12 Maximum Distance: (cm) 1 Wound Description Classification: Category/Stage III Wound Margin: Well defined, not attached Exudate Amount: Medium Exudate Type: Serosanguineous Exudate Color: red, brown Foul Odor After Cleansing: No Slough/Fibrino Yes Wound Bed Granulation Amount: Large (67-100%) Exposed Structure Granulation Quality: Red, Pink Fascia Exposed: No Necrotic Amount: Small (1-33%) Fat Layer (Subcutaneous Tissue) Exposed: Yes Necrotic Quality: Adherent Slough Tendon Exposed: No Muscle Exposed: No Joint Exposed: No Bone Exposed: No Electronic Signature(s) Signed: 05/10/2021 4:49:13 PM By: [name] RN, BSN Entered By: [name] on 05/09/2021 11:12:49

## 2021-05-23 ENCOUNTER — Other Ambulatory Visit: Payer: Self-pay

## 2021-05-23 ENCOUNTER — Encounter (HOSPITAL_BASED_OUTPATIENT_CLINIC_OR_DEPARTMENT_OTHER): Payer: Medicare Other | Attending: Internal Medicine | Admitting: Internal Medicine

## 2021-05-23 DIAGNOSIS — T8131XA Disruption of external operation (surgical) wound, not elsewhere classified, initial encounter: Secondary | ICD-10-CM | POA: Insufficient documentation

## 2021-05-23 DIAGNOSIS — I10 Essential (primary) hypertension: Secondary | ICD-10-CM | POA: Insufficient documentation

## 2021-05-23 DIAGNOSIS — F0150 Vascular dementia without behavioral disturbance: Secondary | ICD-10-CM | POA: Diagnosis not present

## 2021-05-23 DIAGNOSIS — Z87891 Personal history of nicotine dependence: Secondary | ICD-10-CM | POA: Insufficient documentation

## 2021-05-23 DIAGNOSIS — Z86711 Personal history of pulmonary embolism: Secondary | ICD-10-CM | POA: Diagnosis not present

## 2021-05-23 DIAGNOSIS — L89103 Pressure ulcer of unspecified part of back, stage 3: Secondary | ICD-10-CM | POA: Insufficient documentation

## 2021-05-23 DIAGNOSIS — I428 Other cardiomyopathies: Secondary | ICD-10-CM | POA: Diagnosis not present

## 2021-05-23 DIAGNOSIS — Z86718 Personal history of other venous thrombosis and embolism: Secondary | ICD-10-CM | POA: Diagnosis not present

## 2021-05-23 DIAGNOSIS — Z7901 Long term (current) use of anticoagulants: Secondary | ICD-10-CM | POA: Insufficient documentation

## 2021-05-23 DIAGNOSIS — X58XXXA Exposure to other specified factors, initial encounter: Secondary | ICD-10-CM | POA: Diagnosis not present

## 2021-05-23 DIAGNOSIS — L89323 Pressure ulcer of left buttock, stage 3: Secondary | ICD-10-CM | POA: Diagnosis not present

## 2021-05-24 NOTE — Progress Notes (Signed)
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 wound was Gradually Appeared. The date acquired was: 09/12/2020. The wound has been in treatment 27 weeks. The wound is located on the Left Gluteus. The wound measures 1cm length x 0.8cm width x 0.6cm depth; 0.628cm^2 area and 0.377cm^3 volume. There is Fat Layer (Subcutaneous Tissue) exposed. There is no undermining noted, however, there is tunneling at 3:00 with a maximum distance of 5cm. There is a medium amount of serosanguineous drainage noted. The wound margin is well defined and not attached to the wound base. There is large (67-100%) red, hyper - granulation within the wound bed. There is no necrotic tissue within the wound bed. General Notes: measurements per home health nurse, Sarah Wound #6 status is Open. Original cause of wound was Surgical Injury. The date acquired was: 09/12/2020. The wound has been in treatment 27  weeks. The wound is located on the Midline Back. The wound measures 4cm length x 3.5cm width x 0.3cm depth; 10.996cm^2 area and 3.299cm^3 volume. There is Fat Layer (Subcutaneous Tissue) exposed. There is no tunneling or undermining noted. There is a medium amount of purulent drainage noted. The wound margin is well defined and not attached to the wound base. There is large (67-100%) red, pink granulation within the wound bed. There is a small (1-33%) amount of necrotic tissue within the wound bed including Adherent Slough. 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 We are using silver alginate to both wound areas 2 the wound VAC can be returned 3. In the interest of the drainage that is on the back wound bed still like to get a swab culture. If she was coming into the clinic I might look at this with Mole[employer] or deep tissue culture Electronic Signature(s) Signed: 05/23/2021 4:37:06 PM By: Baltazar Najjar MD Entered By: Baltazar Najjar on 05/23/2021 11:35:28 -------------------------------------------------------------------------------- SuperBill Details Patient Name: Date of Service: [name] 05/23/2021 Medical Record Number: [id number] Patient Account Number: [id number] Date of Birth/Sex: Treating RN: [birth year] (80 y.o. 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

## 2021-05-24 NOTE — Progress Notes (Signed)
[name], DENNISE RAABE ([id number]) Visit Report for 05/23/2021 Wound Assessment Details Patient Name: Date of Service: [name], [name] 05/23/2021 11:00 A M Medical Record Number: [id number] Patient Account Number: [id number] Date of Birth/Sex: Treating RN: March 02, 1941 (80 y.o. [name] Primary Care Rekita Miotke: [name] Other Clinician: Referring Amberrose Friebel: Treating Aliayah Tyer/Extender: Burnetta Sabin in Treatment: 27 Wound Status Wound Number: 5 Primary Etiology: Pressure Ulcer Wound Location: Left Gluteus Wound Status: Open Wounding Event: Gradually Appeared Comorbid History: Hypertension, Dementia Date Acquired: 09/12/2020 Weeks Of Treatment: 27 Clustered Wound: No Wound Measurements Length: (cm) 1 % Reduction in Area: 73.3% Width: (cm) 0.8 % Reduction in Volume: 84% Depth: (cm) 0.6 Epithelialization: None Area: (cm) 0.628 Tunneling: Yes Volume: (cm) 0.377 Position (o'clock): 3 Maximum Distance: (cm) 5 Undermining: No Wound Description Classification: Category/Stage III Foul Odor After Cleansing: No Wound Margin: Well defined, not attached Slough/Fibrino No Exudate Amount: Medium Exudate Type: Serosanguineous Exudate Color: red, brown Wound Bed Granulation Amount: Large (67-100%) Exposed Structure Granulation Quality: Red, Hyper-granulation Fascia Exposed: No Necrotic Amount: None Present (0%) Fat Layer (Subcutaneous Tissue) Exposed: Yes Tendon Exposed: No Muscle Exposed: No Joint Exposed: No Bone Exposed: No Assessment Notes measurements per home health nurse, Maralyn Sago Electronic Signature(s) Signed: 05/24/2021 4:58:13 PM By: [name] RN, BSN Entered By: [name] on 05/23/2021 11:32:45 -------------------------------------------------------------------------------- Wound Assessment Details Patient Name: Date of Service: Sarah Hose D. 05/23/2021 11:00 A M Medical Record Number: [id number] Patient Account  Number: [id number] Date of Birth/Sex: Treating RN: 1941/01/11 (80 y.o. [name] Primary Care Elizet Kaplan: [name] Other Clinician: Referring Kinesha Auten: Treating Kymoni Lesperance/Extender: Burnetta Sabin in Treatment: 27 Wound Status Wound Number: 6 Primary Etiology: Pressure Ulcer Wound Location: Midline Back Wound Status: Open Wounding Event: Surgical Injury Comorbid History: Hypertension, Dementia Date Acquired: 09/12/2020 Weeks Of Treatment: 27 Clustered Wound: No Wound Measurements Length: (cm) 4 Width: (cm) 3.5 Depth: (cm) 0.3 Area: (cm) 10.996 Volume: (cm) 3.299 % Reduction in Area: 37.8% % Reduction in Volume: 81.3% Epithelialization: None Tunneling: No Undermining: No Wound Description Classification: Category/Stage III Wound Margin: Well defined, not attached Exudate Amount: Medium Exudate Type: Purulent Exudate Color: yellow, brown, green Foul Odor After Cleansing: No Slough/Fibrino Yes Wound Bed Granulation Amount: Large (67-100%) Exposed Structure Granulation Quality: Red, Pink Fascia Exposed: No Necrotic Amount: Small (1-33%) Fat Layer (Subcutaneous Tissue) Exposed: Yes Necrotic Quality: Adherent Slough Tendon Exposed: No Muscle Exposed: No Joint Exposed: No Bone Exposed: No Assessment Notes measurements per home health nurse, Interior and spatial designer) Signed: 05/24/2021 4:58:13 PM By: [name] RN, BSN Entered By: [name] on 05/23/2021 11:33:15

## 2021-06-06 ENCOUNTER — Other Ambulatory Visit: Payer: Self-pay

## 2021-06-06 ENCOUNTER — Encounter (HOSPITAL_BASED_OUTPATIENT_CLINIC_OR_DEPARTMENT_OTHER): Payer: Medicare Other | Admitting: Internal Medicine

## 2021-06-08 NOTE — Progress Notes (Signed)
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 Unable to Obtain Patient History due to Dementia. Information obtained from Patient. Allergies codeine, alteplase Family History Cancer - Siblings, Lung Disease - Siblings, No family history of Diabetes, Heart Disease, Hereditary Spherocytosis, Hypertension, Kidney Disease, Seizures, Stroke, Thyroid Problems, Tuberculosis. Social History Former smoker, Marital Status - Widowed, Alcohol Use - Never, Drug Use - No History, Caffeine Use - Daily. Medical History Eyes Denies history of Cataracts, Glaucoma, Optic Neuritis Ear/Nose/Mouth/Throat Denies history of Chronic sinus problems/congestion, Middle ear problems Hematologic/Lymphatic Denies history of Anemia, Hemophilia, Human Immunodeficiency Virus, Lymphedema, Sickle Cell Disease Respiratory Denies history of Aspiration, Asthma, Chronic Obstructive Pulmonary Disease (COPD), Pneumothorax, Sleep Apnea, Tuberculosis Cardiovascular Patient has history of  Hypertension Denies history of Angina, Arrhythmia, Congestive Heart Failure, Coronary Artery Disease, Deep Vein Thrombosis, Hypotension, Myocardial Infarction, Peripheral Arterial Disease, Peripheral Venous Disease, Phlebitis, Vasculitis Gastrointestinal Denies history of Cirrhosis , Colitis, Crohnoos, Hepatitis A, Hepatitis B, Hepatitis C Endocrine Denies history of Type I Diabetes, Type II Diabetes Genitourinary Denies history of End Stage Renal Disease Immunological Denies history of Lupus Erythematosus, Raynaudoos, Scleroderma Integumentary (Skin) Denies history of History of Burn Musculoskeletal Denies history of Gout, Rheumatoid Arthritis, Osteoarthritis, Osteomyelitis Neurologic Patient has history of Dementia Denies history of Neuropathy, Quadriplegia, Paraplegia, Seizure Disorder Oncologic Denies history of Received Chemotherapy, Received Radiation Psychiatric Denies history  of Anorexia/bulimia, Confinement Anxiety Hospitalization/Surgery History - OR debridement/ metal plate removal 89/38. Objective Constitutional Vitals Time Taken: 11:06 AM, Height: 62 in. Integumentary (Hair, Skin) Wound #5 status is Open. Original cause of wound was Gradually Appeared. The date acquired was: 09/12/2020. The wound has been in treatment 29 weeks. The wound is located on the Left Gluteus. The wound measures 1cm length x 0.8cm width x 0.6cm depth; 0.628cm^2 area and 0.377cm^3 volume. There is Fat Layer (Subcutaneous Tissue) exposed. There is no undermining noted, however, there is tunneling at 3:00 with a maximum distance of 5.75cm. There is a medium amount of serosanguineous drainage noted. The wound margin is well defined and not attached to the wound base. There is large (67-100%) red, hyper - granulation within the wound bed. There is no necrotic tissue within the wound bed. Wound #6 status is Open. Original cause of wound was Surgical Injury. The date acquired was: 09/12/2020. The wound  has been in treatment 29 weeks. The wound is located on the Midline Back. The wound measures 4cm length x 3.5cm width x 0.3cm depth; 10.996cm^2 area and 3.299cm^3 volume. There is Fat Layer (Subcutaneous Tissue) exposed. There is no tunneling or undermining noted. There is a medium amount of purulent drainage noted. The wound margin is well defined and not attached to the wound base. There is large (67-100%) red, pink granulation within the wound bed. There is a small (1-33%) amount of necrotic tissue within the wound bed including Adherent Slough. Assessment Active Problems ICD-10 Pressure ulcer of unspecified part of back, stage 3 Pressure ulcer of left buttock, stage 3 Disruption of external operation (surgical) wound, not elsewhere classified, initial encounter Plan Follow-up Appointments: Return Appointment in 2 weeks. - Tele-visit 11/09 @ 11:00 am Negative Presssure Wound Therapy: Wound #6 Midline Back: Discontinue wound vac Off-Loading: Low air-loss mattress (Group 2) Turn and reposition every 2 hours Additional Orders / Instructions: Follow Nutritious Diet - Continue ProStat Home Health: New wound care orders this week; continue Home Health for wound care. May utilize formulary equivalent dressing for wound treatment orders unless otherwise specified. - Obtain wound culture of back wound on next visit - fax results to [phone]. Discontinue wound vac. Other Home Health Orders/Instructions: - [facility] for Wound Care 3x week. WOUND #5: - Gluteus Wound Laterality: Left Cleanser: Wound Cleanser (Home Health) 3 x Per Week/15 Days Discharge Instructions: Cleanse the wound with wound cleanser prior to applying a clean dressing using gauze sponges, not tissue or cotton balls. Topical: Mupirocin Ointment (Home Health) 3 x Per Week/15 Days Discharge Instructions: Apply Mupirocin (Bactroban) as instructed Prim Dressing: KerraCel Ag Gelling Fiber Dressing, 0.75x12 Ribbon  (silver alginate) (Home Health) 3 x Per Week/15 Days ary Discharge Instructions: Make sure to lightly pack into tunnel Secondary Dressing: Zetuvit Plus Silicone Border Dressing 4x4 (in/in) (Home Health) 3 x Per Week/15 Days Discharge Instructions: Apply silicone border or abd pad with tape over primary dressing as directed. WOUND #6: - Back Wound Laterality: Midline Cleanser: Wound Cleanser (Home Health) 3 x Per Week/15 Days Discharge Instructions: Cleanse the wound with wound cleanser prior to applying a clean dressing using gauze sponges, not tissue or cotton balls. Topical: Mupirocin Ointment (Home Health) 3 x Per Week/15 Days Discharge Instructions: Apply Mupirocin (Bactroban) as instructed Prim Dressing: KerraCel Ag Gelling Fiber Dressing, 0.75x12 Ribbon (silver alginate) (Home Health) 3 x Per Week/15 Days ary Discharge Instructions: Make sure to lightly pack into tunnel Secondary Dressing: Zetuvit Plus Silicone Border Dressing 4x4 (in/in) (Home  Health) 3 x Per Week/15 Days Discharge Instructions: Apply silicone border or abd pad with tape over primary dressing as directed. 1. I continue with the silver alginate 2. Still Bactroban on the wound surface on his back to cover the MSSA we cultured. He is receiving 7 days of Levaquin. I used a quinolone because of the once a day dosing 3. With regards to the tunnel I am not exactly sure where this abuts. Could be underlying pelvic osteomyelitis I am really not sure Electronic Signature(s) Signed: 06/06/2021 4:58:10 PM By: Baltazar Najjar MD Entered By: Baltazar Najjar on 06/06/2021 11:31:34 -------------------------------------------------------------------------------- HxROS Details Patient Name: Date of Service: Sarah Hose D. 06/06/2021 11:00 A M Medical Record Number: [id number] Patient Account Number: [id number] Date of Birth/Sex: Treating RN: June 21, 1941 (80 y.o. Ardis Rowan, [name] Primary Care Provider: [name] Other  Clinician: Referring Provider: Treating Provider/Extender: Burnetta Sabin in Treatment: 42 Unable to Obtain Patient History due to Dementia Information Obtained From Patient Eyes Medical History: Negative for: Cataracts; Glaucoma; Optic Neuritis Ear/Nose/Mouth/Throat Medical History: Negative for: Chronic sinus problems/congestion; Middle ear problems Hematologic/Lymphatic Medical History: Negative for: Anemia; Hemophilia; Human Immunodeficiency Virus; Lymphedema; Sickle Cell Disease Respiratory Medical History: Negative for: Aspiration; Asthma; Chronic Obstructive Pulmonary Disease (COPD); Pneumothorax; Sleep Apnea; Tuberculosis Cardiovascular Medical History: Positive for: Hypertension Negative for: Angina; Arrhythmia; Congestive Heart Failure; Coronary Artery Disease; Deep Vein Thrombosis; Hypotension; Myocardial Infarction; Peripheral Arterial Disease; Peripheral Venous Disease; Phlebitis; Vasculitis Gastrointestinal Medical History: Negative for: Cirrhosis ; Colitis; Crohns; Hepatitis A; Hepatitis B; Hepatitis C Endocrine Medical History: Negative for: Type I Diabetes; Type II Diabetes Genitourinary Medical History: Negative for: End Stage Renal Disease Immunological Medical History: Negative for: Lupus Erythematosus; Raynauds; Scleroderma Integumentary (Skin) Medical History: Negative for: History of Burn Musculoskeletal Medical History: Negative for: Gout; Rheumatoid Arthritis; Osteoarthritis; Osteomyelitis Neurologic Medical History: Positive for: Dementia Negative for: Neuropathy; Quadriplegia; Paraplegia; Seizure Disorder Oncologic Medical History: Negative for: Received Chemotherapy; Received Radiation Psychiatric Medical History: Negative for: Anorexia/bulimia; Confinement Anxiety Immunizations Pneumococcal Vaccine: Received Pneumococcal Vaccination: No Implantable Devices None Hospitalization / Surgery History Type of  Hospitalization/Surgery OR debridement/ metal plate removal 03/21 Family and Social History Cancer: Yes - Siblings; Diabetes: No; Heart Disease: No; Hereditary Spherocytosis: No; Hypertension: No; Kidney Disease: No; Lung Disease: Yes - Siblings; Seizures: No; Stroke: No; Thyroid Problems: No; Tuberculosis: No; Former smoker; Marital Status - Widowed; Alcohol Use: Never; Drug Use: No History; Caffeine Use: Daily; Financial Concerns: No; Food, Clothing or Shelter Needs: No; Support System Lacking: No; Transportation Concerns: No Psychologist, prison and probation services) Signed: 06/06/2021 4:58:10 PM By: Baltazar Najjar MD Signed: 06/08/2021 12:25:45 PM By: Fonnie Mu RN Entered By: Fonnie Mu on 06/06/2021 11:09:09 -------------------------------------------------------------------------------- SuperBill Details Patient Name: Date of Service: [name] 06/06/2021 Medical Record Number: [id number] Patient Account Number: [id number] Date of Birth/Sex: Treating RN: 18-Oct-1940 (80 y.o. [name] Primary Care Provider: [name] Other Clinician: Referring Provider: Treating Provider/Extender: Burnetta Sabin in Treatment: 29 Diagnosis Coding ICD-10 Codes Code Description L89.103 Pressure ulcer of unspecified part of back, stage 3 L89.323 Pressure ulcer of left buttock, stage 3 T81.31XA Disruption of external operation (surgical) wound, not elsewhere classified, initial encounter Physician Procedures : CPT4 Code Description Modifier [id number] - WC PHYS LEVEL 3 - EST PT ICD-10 Diagnosis Description L89.103 Pressure ulcer of unspecified part of back, stage 3 L89.323 Pressure ulcer of left buttock, stage 3 T81.31XA Disruption of external operation  (surgical) wound, not elsewhere classified, initial encounter Quantity: 1 Electronic Signature(s) Signed:  06/06/2021 4:58:10 PM By: Baltazar Najjar MD Entered By: Baltazar Najjar on 06/06/2021 11:31:57

## 2021-06-08 NOTE — Progress Notes (Signed)
Waldrop, [name] ([id number]) Visit Report for 06/06/2021 Allergy List Details Patient Name: Date of Service: [name], [name] 06/06/2021 11:00 A M Medical Record Number: [id number] Patient Account Number: [id number] Date of Birth/Sex: Treating RN: 07-May-1941 (80 y.o. Ardis Rowan, [name] Primary Care Jeselle Hiser: [name] Other Clinician: Referring Dolph Tavano: Treating Damen Windsor/Extender: Burnetta Sabin in Treatment: 29 Allergies Active Allergies codeine alteplase Allergy Notes Electronic Signature(s) Signed: 06/06/2021 4:58:10 PM By: Baltazar Najjar MD Entered By: Baltazar Najjar on 06/06/2021 11:20:11 -------------------------------------------------------------------------------- Patient/Caregiver Education Details Patient Name: Date of Service: [name] 10/26/2022andnbsp11:00 A M Medical Record Number: [id number] Patient Account Number: [id number] Date of Birth/Gender: Treating RN: 24-Aug-1940 (80 y.o. Ardis Rowan, [name] Primary Care Physician: [name] Other Clinician: Referring Physician: Treating Physician/Extender: Burnetta Sabin in Treatment: 29 Education Assessment Education Provided To: Patient Education Topics Provided Wound/Skin Impairment: Methods: Explain/Verbal Responses: Reinforcements needed, State content correctly [employer]) Signed: 06/08/2021 12:25:45 PM By: Fonnie Mu RN Entered By: Fonnie Mu on 06/06/2021 11:11:00 -------------------------------------------------------------------------------- Wound Assessment Details Patient Name: Date of Service: Sarah Hose D. 06/06/2021 11:00 A M Medical Record Number: [id number] Patient Account Number: [id number] Date of Birth/Sex: Treating RN: Apr 03, 1941 (80 y.o. Ardis Rowan, [name] Primary Care Taelynn Mcelhannon: [name] Other Clinician: Referring Avrum Kimball: Treating Mahi Zabriskie/Extender: Burnetta Sabin in Treatment: 29 Wound Status Wound Number: 5 Primary Etiology: Pressure Ulcer Wound Location: Left Gluteus Wound Status: Open Wounding Event: Gradually Appeared Comorbid History: Hypertension, Dementia Date Acquired: 09/12/2020 Weeks Of Treatment: 29 Clustered Wound: No Wound Measurements Length: (cm) 1 Width: (cm) 0.8 Depth: (cm) 0.6 Area: (cm) 0.628 Volume: (cm) 0.377 % Reduction in Area: 73.3% % Reduction in Volume: 84% Epithelialization: None Tunneling: Yes Position (o'clock): 3 Maximum Distance: (cm) 5.75 Undermining: No Wound Description Classification: Category/Stage III Wound Margin: Well defined, not attached Exudate Amount: Medium Exudate Type: Serosanguineous Exudate Color: red, brown Foul Odor After Cleansing: No Slough/Fibrino No Wound Bed Granulation Amount: Large (67-100%) Exposed Structure Granulation Quality: Red, Hyper-granulation Fascia Exposed: No Necrotic Amount: None Present (0%) Fat Layer (Subcutaneous Tissue) Exposed: Yes Tendon Exposed: No Muscle Exposed: No Joint Exposed: No Bone Exposed: No Electronic Signature(s) Signed: 06/08/2021 12:25:45 PM By: Fonnie Mu RN Entered By: Fonnie Mu on 06/06/2021 11:08:24 -------------------------------------------------------------------------------- Wound Assessment Details Patient Name: Date of Service: Sarah Hose D. 06/06/2021 11:00 A M Medical Record Number: [id number] Patient Account Number: [id number] Date of Birth/Sex: Treating RN: 01/08/1941 (80 y.o. Ardis Rowan, [name] Primary Care Daveah Varone: [name] Other Clinician: Referring Xochilt Conant: Treating Svetlana Bagby/Extender: Burnetta Sabin in Treatment: 29 Wound Status Wound Number: 6 Primary Etiology: Pressure Ulcer Wound Location: Midline Back Wound Status: Open Wounding Event: Surgical Injury Comorbid History: Hypertension, Dementia Date Acquired:  09/12/2020 Weeks Of Treatment: 29 Clustered Wound: No Wound Measurements Length: (cm) 4 Width: (cm) 3.5 Depth: (cm) 0.3 Area: (cm) 10.996 Volume: (cm) 3.299 % Reduction in Area: 37.8% % Reduction in Volume: 81.3% Epithelialization: None Tunneling: No Undermining: No Wound Description Classification: Category/Stage III Wound Margin: Well defined, not attached Exudate Amount: Medium Exudate Type: Purulent Exudate Color: yellow, brown, green Foul Odor After Cleansing: No Slough/Fibrino Yes Wound Bed Granulation Amount: Large (67-100%) Exposed Structure Granulation Quality: Red, Pink Fascia Exposed: No Necrotic Amount: Small (1-33%) Fat Layer (Subcutaneous Tissue) Exposed: Yes Necrotic Quality: Adherent Slough Tendon Exposed: No Muscle Exposed: No Joint Exposed: No Bone Exposed: No Electronic Signature(s) Signed: 06/08/2021 12:25:45 PM By: Fonnie Mu RN Entered By: Fonnie Mu on 06/06/2021 11:08:55 -------------------------------------------------------------------------------- Vitals Details Patient Name:  Date of Service: [name], [name] 06/06/2021 11:00 A M Medical Record Number: [id number] Patient Account Number: [id number] Date of Birth/Sex: Treating RN: June 04, 1941 (80 y.o. Ardis Rowan, [name] Primary Care Alee Gressman: [name] Other Clinician: Referring Lesly Joslyn: Treating Shayan Bramhall/Extender: Burnetta Sabin in Treatment: 29 Vital Signs Time Taken: 11:06 Reference Range: 80 - 120 mg / dl Height (in): 62 Electronic Signature(s) Signed: 06/08/2021 12:25:45 PM By: Fonnie Mu RN Entered By: Fonnie Mu on 06/06/2021 11:06:36

## 2021-06-20 ENCOUNTER — Encounter (HOSPITAL_BASED_OUTPATIENT_CLINIC_OR_DEPARTMENT_OTHER): Payer: Medicare Other | Attending: Internal Medicine | Admitting: Internal Medicine

## 2021-06-20 ENCOUNTER — Other Ambulatory Visit: Payer: Self-pay

## 2021-06-20 DIAGNOSIS — T8131XA Disruption of external operation (surgical) wound, not elsewhere classified, initial encounter: Secondary | ICD-10-CM | POA: Insufficient documentation

## 2021-06-20 DIAGNOSIS — I10 Essential (primary) hypertension: Secondary | ICD-10-CM | POA: Insufficient documentation

## 2021-06-20 DIAGNOSIS — Y838 Other surgical procedures as the cause of abnormal reaction of the patient, or of later complication, without mention of misadventure at the time of the procedure: Secondary | ICD-10-CM | POA: Insufficient documentation

## 2021-06-20 DIAGNOSIS — Z8673 Personal history of transient ischemic attack (TIA), and cerebral infarction without residual deficits: Secondary | ICD-10-CM | POA: Insufficient documentation

## 2021-06-20 DIAGNOSIS — L89323 Pressure ulcer of left buttock, stage 3: Secondary | ICD-10-CM | POA: Insufficient documentation

## 2021-06-20 DIAGNOSIS — Z86718 Personal history of other venous thrombosis and embolism: Secondary | ICD-10-CM | POA: Diagnosis not present

## 2021-06-20 DIAGNOSIS — L89103 Pressure ulcer of unspecified part of back, stage 3: Secondary | ICD-10-CM | POA: Diagnosis present

## 2021-06-20 DIAGNOSIS — Z86711 Personal history of pulmonary embolism: Secondary | ICD-10-CM | POA: Insufficient documentation

## 2021-06-21 NOTE — Progress Notes (Signed)
Hauger, [name] ([id number]) Visit Report for 06/20/2021 Allergy List Details Patient Name: Date of Service: [name], [name] 06/20/2021 11:00 A M Medical Record Number: [id number] Patient Account Number: [id number] Date of Birth/Sex: Treating RN: 08-Feb-1941 (80 y.o. [name] Primary Care Karis Rilling: [name] Other Clinician: Referring Jeweliana Dudgeon: Treating Channon Brougher/Extender: Burnetta Sabin in Treatment: 31 Allergies Active Allergies codeine alteplase Allergy Notes Electronic Signature(s) Signed: 06/20/2021 4:30:00 PM By: Baltazar Najjar MD Entered By: Baltazar Najjar on 06/20/2021 12:42:24 -------------------------------------------------------------------------------- Wound Assessment Details Patient Name: Date of Service: Sarah Hose D. 06/20/2021 11:00 A M Medical Record Number: [id number] Patient Account Number: [id number] Date of Birth/Sex: Treating RN: 02/03/1941 (80 y.o. [name] Primary Care Marche Hottenstein: [name] Other Clinician: Referring Arilyn Brierley: Treating Chaundra Abreu/Extender: Burnetta Sabin in Treatment: 31 Wound Status Wound Number: 5 Primary Etiology: Pressure Ulcer Wound Location: Left Gluteus Wound Status: Open Wounding Event: Gradually Appeared Comorbid History: Hypertension, Dementia Date Acquired: 09/12/2020 Weeks Of Treatment: 31 Clustered Wound: No Wound Measurements Length: (cm) 1 Width: (cm) 1 Depth: (cm) 0.6 Area: (cm) 0.785 Volume: (cm) 0.471 % Reduction in Area: 66.7% % Reduction in Volume: 80% Epithelialization: None Tunneling: Yes Position (o'clock): 3 Maximum Distance: (cm) 5 Undermining: No Wound Description Classification: Category/Stage III Wound Margin: Well defined, not attached Exudate Amount: Medium Exudate Type: Serosanguineous Exudate Color: red, brown Foul Odor After Cleansing: No Slough/Fibrino No Wound Bed Granulation Amount: Large (67-100%)  Exposed Structure Granulation Quality: Red, Hyper-granulation Fascia Exposed: No Necrotic Amount: None Present (0%) Fat Layer (Subcutaneous Tissue) Exposed: Yes Tendon Exposed: No Muscle Exposed: No Joint Exposed: No Bone Exposed: No Electronic Signature(s) Signed: 06/21/2021 5:34:00 PM By: [name] RN, BSN Entered By: [name] on 06/20/2021 11:20:20 -------------------------------------------------------------------------------- Wound Assessment Details Patient Name: Date of Service: Sarah Hose D. 06/20/2021 11:00 A M Medical Record Number: [id number] Patient Account Number: [id number] Date of Birth/Sex: Treating RN: 1941/02/27 (80 y.o. [name] Primary Care Corisa Montini: [name] Other Clinician: Referring Swayze Pries: Treating Korinne Greenstein/Extender: Burnetta Sabin in Treatment: 31 Wound Status Wound Number: 6 Primary Etiology: Pressure Ulcer Wound Location: Midline Back Wound Status: Open Wounding Event: Surgical Injury Comorbid History: Hypertension, Dementia Date Acquired: 09/12/2020 Weeks Of Treatment: 31 Clustered Wound: No Wound Measurements Length: (cm) 4 Width: (cm) 3.5 Depth: (cm) 0.3 Area: (cm) 10.996 Volume: (cm) 3.299 % Reduction in Area: 37.8% % Reduction in Volume: 81.3% Epithelialization: None Tunneling: No Undermining: No Wound Description Classification: Category/Stage III Wound Margin: Well defined, not attached Exudate Amount: Medium Exudate Type: Purulent Exudate Color: yellow, brown, green Foul Odor After Cleansing: No Slough/Fibrino Yes Wound Bed Granulation Amount: Large (67-100%) Exposed Structure Granulation Quality: Red, Pink Fascia Exposed: No Necrotic Amount: Small (1-33%) Fat Layer (Subcutaneous Tissue) Exposed: Yes Necrotic Quality: Adherent Slough Tendon Exposed: No Muscle Exposed: No Joint Exposed: No Bone Exposed: No Electronic Signature(s) Signed: 06/21/2021 5:34:00 PM By:  [name] RN, BSN Entered By: [name] on 06/20/2021 11:20:58

## 2021-06-21 NOTE — Progress Notes (Signed)
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 patient's son was present. We do this because of the difficulty transporting the patient to our clinic and the cost of such transport. Culture we did last time of the back wound grew MSSA. I gave him 7 days of Levaquin and topical Bactroban. Home health says the wound is doing better measuring smaller and seems to have less drainage. The remaining area on the left buttock still is 5.7 cm in depth. This has not improved in several weeks. We have been using silver alginate to both wound areas The area on her buttock is not responding. There is minimal to moderate drainage per the home health nurse but no surrounding tenderness demonstrated today 11/9; telehealth visit with the assistance of the patient's home health nurse. The patient's son was also present. We do this because of the difficulty in transporting her to our clinic and the cost of such transport. The original wound was her area on the back with infected  hardware. This is really coming nicely. We have been using silver alginate. The area on the left buttock is now down to 5 cm in depth from 5.7 the last time. This is also improved the patient does not have any tenderness around her wounds. Home health seems quite happy that the wounds are contracting Allergies codeine, alteplase Objective Integumentary (Hair,  Skin) Wound #5 status is Open. Original cause of wound was Gradually Appeared. The date acquired was: 09/12/2020. The wound has been in treatment 31 weeks. The wound is located on the Left Gluteus. The wound measures 1cm length x 1cm width x 0.6cm depth; 0.785cm^2 area and 0.471cm^3 volume. There is Fat Layer (Subcutaneous Tissue) exposed. There is no undermining noted, however, there is tunneling at 3:00 with a maximum distance of 5cm. There is a medium amount of serosanguineous drainage noted. The wound margin is well defined and not attached to the wound base. There is large (67-100%) red, hyper - granulation within the wound bed. There is no necrotic tissue within the wound bed. Wound #6 status is Open. Original cause of wound was Surgical Injury. The date acquired was: 09/12/2020. The wound has been in treatment 31 weeks. The wound is located on the Midline Back. The wound measures 4cm length x 3.5cm width x 0.3cm depth; 10.996cm^2 area and 3.299cm^3 volume. There is Fat Layer (Subcutaneous Tissue) exposed. There is no tunneling or undermining noted. There is a medium amount of purulent drainage noted. The wound margin is well defined and not attached to the wound base. There is large (67-100%) red, pink granulation within the wound bed. There is a small (1-33%) amount of necrotic tissue within the wound bed including Adherent Slough. 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 - Tele-visit 12/7 at 11 am Off-Loading: Low air-loss mattress (Group 2) Turn and reposition every 2 hours Additional Orders / Instructions: Follow Nutritious Diet - Continue ProStat Home Health: No change in wound care orders this week; continue Home Health for wound care. May utilize formulary equivalent dressing for wound treatment orders unless otherwise specified. Other Home Health Orders/Instructions: - [place] for Wound Care 3x week. WOUND #5: - Gluteus Wound Laterality: Left Cleanser: Wound Cleanser (Home Health) 3 x Per Week/15 Days Discharge Instructions: Cleanse the wound with wound cleanser prior to applying a clean dressing using gauze sponges, not tissue or cotton balls. Topical: Mupirocin Ointment (Home Health) 3 x Per Week/15 Days Discharge Instructions: Apply Mupirocin (Bactroban) as instructed Prim Dressing: KerraCel Ag Gelling Fiber Dressing, 0.75x12 Ribbon (silver alginate) ([facility]) 3 x Per Week/15 Days ary Discharge Instructions: Make sure to lightly pack into tunnel Secondary Dressing: Zetuvit Plus Silicone Border Dressing 4x4 (in/in) ([facility]) 3 x Per Week/15 Days Discharge Instructions: Apply silicone border or abd pad with tape over primary dressing as directed. WOUND #6: - Back Wound Laterality: Midline Cleanser: Wound Cleanser (Home Health) 3 x Per Week/15 Days Discharge Instructions: Cleanse the wound with wound cleanser prior to applying a clean dressing using gauze sponges, not tissue or cotton balls. Topical: Mupirocin Ointment (Home Health) 3 x Per Week/15 Days Discharge Instructions: Apply Mupirocin (Bactroban) as instructed Prim Dressing: KerraCel Ag Gelling Fiber Dressing, 0.75x12 Ribbon (silver alginate) ([facility]) 3 x Per Week/15 Days ary Discharge Instructions: Make sure to lightly pack into tunnel Secondary Dressing: Zetuvit Plus Silicone Border  Dressing 4x4 (in/in) ([facility]) 3 x Per Week/15 Days Discharge Instructions: Apply silicone border or abd pad with tape over primary dressing as directed. 1. We continued with silver alginate in both wound areas. Both wounds appear to be contracting. 2. The patient would not be an easy transport for outpatient imaging. I wondered specifically  about the area on the left buttock Electronic Signature(s) Signed: 06/20/2021 4:30:00 PM By: Linton Ham MD Entered By: Linton Ham on 06/20/2021 12:45:49 -------------------------------------------------------------------------------- SuperBill Details Patient Name: Date of Service: [name] 06/20/2021 Medical Record Number: [id number] Patient Account Number: [id number] Date of Birth/Sex: Treating RN: May 25, 1941 (80 y.o. 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

## 2021-07-18 ENCOUNTER — Other Ambulatory Visit: Payer: Self-pay

## 2021-07-18 ENCOUNTER — Encounter (HOSPITAL_BASED_OUTPATIENT_CLINIC_OR_DEPARTMENT_OTHER): Payer: Medicare Other | Attending: Internal Medicine | Admitting: Internal Medicine

## 2021-07-18 DIAGNOSIS — T8131XA Disruption of external operation (surgical) wound, not elsewhere classified, initial encounter: Secondary | ICD-10-CM | POA: Insufficient documentation

## 2021-07-18 DIAGNOSIS — Z86718 Personal history of other venous thrombosis and embolism: Secondary | ICD-10-CM | POA: Insufficient documentation

## 2021-07-18 DIAGNOSIS — I69318 Other symptoms and signs involving cognitive functions following cerebral infarction: Secondary | ICD-10-CM | POA: Insufficient documentation

## 2021-07-18 DIAGNOSIS — L89323 Pressure ulcer of left buttock, stage 3: Secondary | ICD-10-CM | POA: Insufficient documentation

## 2021-07-18 DIAGNOSIS — Y838 Other surgical procedures as the cause of abnormal reaction of the patient, or of later complication, without mention of misadventure at the time of the procedure: Secondary | ICD-10-CM | POA: Insufficient documentation

## 2021-07-18 DIAGNOSIS — F0390 Unspecified dementia without behavioral disturbance: Secondary | ICD-10-CM | POA: Insufficient documentation

## 2021-07-18 DIAGNOSIS — I428 Other cardiomyopathies: Secondary | ICD-10-CM | POA: Insufficient documentation

## 2021-07-18 DIAGNOSIS — L89103 Pressure ulcer of unspecified part of back, stage 3: Secondary | ICD-10-CM | POA: Insufficient documentation

## 2021-07-18 DIAGNOSIS — Z7901 Long term (current) use of anticoagulants: Secondary | ICD-10-CM | POA: Insufficient documentation

## 2021-07-18 DIAGNOSIS — I10 Essential (primary) hypertension: Secondary | ICD-10-CM | POA: Insufficient documentation

## 2021-07-18 NOTE — Progress Notes (Signed)
[name], [name] ([id number]) Visit Report for 07/18/2021 Allergy List Details Patient Name: Date of Service: [name], [name] 07/18/2021 11:00 A M Medical Record Number: [id number] Patient Account Number: [id number] Date of Birth/Sex: Treating RN: 08-02-1941 (80 y.o. [name], [name] Primary Care Kammy Klett: Nolene Ebbs Other Clinician: Referring Deni Berti: Treating Sherl Yzaguirre/Extender: Lenard Galloway in Treatment: 35 Allergies Active Allergies codeine alteplase Allergy Notes Electronic Signature(s) Signed: 07/18/2021 4:34:42 PM By: Rhae Hammock RN Entered By: Rhae Hammock on 07/18/2021 11:08:45 -------------------------------------------------------------------------------- Patient/Caregiver Education Details Patient Name: Date of Service: [name] 12/7/2022andnbsp11:00 [place] Record Number: [id number] Patient Account Number: [id number] Date of Birth/Gender: Treating RN: [birth year] (80 y.o. [name] Primary Care Physician: Nolene Ebbs Other Clinician: Referring Physician: Treating Physician/Extender: Lenard Galloway in Treatment: 28 Education Assessment Education Provided To: Patient Education Topics Provided Pressure: Methods: Explain/Verbal Responses: State content correctly [employer]) Signed: 07/18/2021 4:34:42 PM By: Rhae Hammock RN Entered By: Rhae Hammock on 07/18/2021 11:10:26 -------------------------------------------------------------------------------- Wound Assessment Details Patient Name: Date of Service: Sarah Seltzer D. 07/18/2021 11:00 A M Medical Record Number: [id number] Patient Account Number: [id number] Date of Birth/Sex: Treating RN: [birth year] (80 y.o. [name], [name] Primary Care Beauregard Jarrells: Nolene Ebbs Other Clinician: Referring Lilliane Sposito: Treating Jeevan Kalla/Extender: Lenard Galloway in Treatment: 35 Wound  Status Wound Number: 5 Primary Etiology: Pressure Ulcer Wound Location: Left Gluteus Wound Status: Open Wounding Event: Gradually Appeared Comorbid History: Hypertension, Dementia Date Acquired: 09/12/2020 Weeks Of Treatment: 35 Clustered Wound: No Wound Measurements Length: (cm) 1 Width: (cm) 1 Depth: (cm) 0.6 Area: (cm) 0.785 Volume: (cm) 0.471 % Reduction in Area: 66.7% % Reduction in Volume: 80% Epithelialization: None Tunneling: Yes Maximum Distance: (cm) 5 Undermining: No Wound Description Classification: Category/Stage III Wound Margin: Well defined, not attached Exudate Amount: Medium Exudate Type: Serosanguineous Exudate Color: red, brown Foul Odor After Cleansing: No Slough/Fibrino No Wound Bed Granulation Amount: Large (67-100%) Exposed Structure Granulation Quality: Red, Hyper-granulation Fascia Exposed: No Necrotic Amount: None Present (0%) Fat Layer (Subcutaneous Tissue) Exposed: Yes Tendon Exposed: No Muscle Exposed: No Joint Exposed: No Bone Exposed: No Electronic Signature(s) Signed: 07/18/2021 4:34:42 PM By: Rhae Hammock RN Entered By: Rhae Hammock on 07/18/2021 11:07:43 -------------------------------------------------------------------------------- Wound Assessment Details Patient Name: Date of Service: Sarah Seltzer D. 07/18/2021 11:00 A M Medical Record Number: [id number] Patient Account Number: [id number] Date of Birth/Sex: Treating RN: Jul 08, 1941 (80 y.o. [name], [name] Primary Care Deannah Rossi: Nolene Ebbs Other Clinician: Referring Evens Meno: Treating Raiden Haydu/Extender: Lenard Galloway in Treatment: 35 Wound Status Wound Number: 6 Primary Etiology: Pressure Ulcer Wound Location: Midline Back Wound Status: Open Wounding Event: Surgical Injury Comorbid History: Hypertension, Dementia Date Acquired: 09/12/2020 Weeks Of Treatment: 35 Clustered Wound: No Wound Measurements Length: (cm) 3 Width:  (cm) 2 Depth: (cm) 0.5 Area: (cm) 4.712 Volume: (cm) 2.356 % Reduction in Area: 73.3% % Reduction in Volume: 86.7% Epithelialization: None Tunneling: No Undermining: No Wound Description Classification: Category/Stage III Wound Margin: Well defined, not attached Exudate Amount: Medium Exudate Type: Purulent Exudate Color: yellow, brown, green Foul Odor After Cleansing: No Slough/Fibrino Yes Wound Bed Granulation Amount: Large (67-100%) Exposed Structure Granulation Quality: Red, Pink Fascia Exposed: No Necrotic Amount: Small (1-33%) Fat Layer (Subcutaneous Tissue) Exposed: Yes Necrotic Quality: Adherent Slough Tendon Exposed: No Muscle Exposed: No Joint Exposed: No Bone Exposed: No Electronic Signature(s) Signed: 07/18/2021 4:34:42 PM By: Rhae Hammock RN Entered By: Rhae Hammock on 07/18/2021 11:05:39 -------------------------------------------------------------------------------- Vitals Details Patient Name: Date of Service: [name], Sarah Flowers.  07/18/2021 11:00 A M Medical Record Number: [id number] Patient Account Number: [id number] Date of Birth/Sex: Treating RN: Dec 28, 1940 (80 y.o. [name], [name] Primary Care Nasira Janusz: [name] Other Clinician: Referring Owen Pratte: Treating Yuritzi Kamp/Extender: Burnetta Sabin in Treatment: 35 Vital Signs Time Taken: 11:09 Reference Range: 80 - 120 mg / dl Height (in): 62 Electronic Signature(s) Signed: 07/18/2021 4:34:42 PM By: Fonnie Mu RN Entered By: Fonnie Mu on 07/18/2021 11:09:59

## 2021-07-18 NOTE — Progress Notes (Signed)
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 Disease Respiratory Denies history of Aspiration, Asthma, Chronic Obstructive Pulmonary Disease (COPD), Pneumothorax, Sleep Apnea, Tuberculosis Cardiovascular Patient has history of Hypertension Denies history of Angina, Arrhythmia, Congestive Heart Failure, Coronary Artery Disease, Deep Vein Thrombosis, Hypotension, Myocardial Infarction, Peripheral Arterial Disease, Peripheral Venous Disease, Phlebitis, Vasculitis Gastrointestinal Denies history of Cirrhosis , Colitis, Crohnoos, Hepatitis A, Hepatitis B, Hepatitis C Endocrine Denies history of Type I Diabetes, Type II Diabetes Genitourinary Denies history of End Stage Renal Disease Immunological Denies history of Lupus Erythematosus, Raynaudoos, Scleroderma Integumentary (Skin) Denies history of History of Burn Musculoskeletal Denies history of Gout, Rheumatoid Arthritis, Osteoarthritis, Osteomyelitis Neurologic Patient has history of Dementia Denies history of Neuropathy, Quadriplegia, Paraplegia, Seizure  Disorder Oncologic Denies history of Received Chemotherapy, Received Radiation Psychiatric Denies history of Anorexia/bulimia, Confinement Anxiety Hospitalization/Surgery History - OR debridement/ metal plate removal [id number]. Objective Constitutional Vitals Time Taken: 11:09 AM, Height: 62 in. Integumentary (Hair, Skin) Wound #5 status is Open. Original  cause of wound was Gradually Appeared. The date acquired was: 09/12/2020. The wound has been in treatment 35 weeks. The wound is located on the Left Gluteus. The wound measures 1cm length x 1cm width x 0.6cm depth; 0.785cm^2 area and 0.471cm^3 volume. There is Fat Layer (Subcutaneous Tissue) exposed. There is no undermining noted, however, there is tunneling at :00 with a maximum distance of 5cm. There is a medium amount of serosanguineous drainage noted. The wound margin is well defined and not attached to the wound base. There is large (67-100%) red, hyper - granulation within the wound bed. There is no necrotic tissue within the wound bed. Wound #6 status is Open. Original cause of wound was Surgical Injury. The date acquired was: 09/12/2020. The wound has been in treatment 35 weeks. The wound is located on the Midline Back. The wound measures 3cm length x 2cm width x 0.5cm depth; 4.712cm^2 area and 2.356cm^3 volume. There is Fat Layer (Subcutaneous Tissue) exposed. There is no tunneling or undermining noted. There is a medium amount of purulent drainage noted. The wound margin is well defined and not attached to the wound base. There is large (67-100%) red, pink granulation within the wound bed. There is a small (1-33%) amount of necrotic tissue within the wound bed including Adherent Slough. Assessment Active Problems ICD-10 Pressure ulcer of unspecified part of back, stage 3 Pressure ulcer of left buttock, stage 3 Disruption of external operation (surgical) wound, not elsewhere classified, initial encounter Plan 1. I continued with the  silver alginate 2. I asked the home care nurse to do a deep swab culture of the area on the left buttock however she is on cephalexin now because of a PEG tube infection prescribed by her primary physician therefore I elected not to do this until at least 5 days past after she finishes her antibiotics. 3. I thought about imaging this area but to make this worthwhile I think she would need an advanced image like a CT scan I am not sure this would be easy to 2 in this patient with advanced dementia 4. We will try to reconnect in 2 weeks Electronic Signature(s) Signed: 07/18/2021 3:48:37 PM By: Linton Ham MD Entered By: Linton Ham on 07/18/2021 11:31:03 -------------------------------------------------------------------------------- HxROS Details Patient Name: Date of Service: Sarah Seltzer D. 07/18/2021 11:00 A M Medical Record Number: [id number] Patient Account Number: [id number] Date of Birth/Sex: Treating RN: Feb 13, 1941 (80 y.o. [name], [name] Primary Care Provider: Nolene Ebbs Other Clinician: Referring Provider: Treating Provider/Extender: Lenard Galloway in Treatment: 35 Unable to Obtain Patient History due to Dementia Information Obtained From Patient Eyes Medical History: Negative for: Cataracts; Glaucoma; Optic Neuritis Ear/Nose/Mouth/Throat Medical History: Negative for: Chronic sinus problems/congestion; Middle ear problems Hematologic/Lymphatic Medical History: Negative for: Anemia; Hemophilia; Human Immunodeficiency Virus; Lymphedema; Sickle Cell Disease Respiratory Medical History: Negative for: Aspiration; Asthma; Chronic Obstructive Pulmonary Disease (COPD); Pneumothorax; Sleep Apnea; Tuberculosis Cardiovascular Medical History: Positive for: Hypertension Negative for: Angina; Arrhythmia; Congestive Heart Failure; Coronary Artery Disease; Deep Vein Thrombosis; Hypotension; Myocardial Infarction; Peripheral Arterial  Disease; Peripheral Venous Disease; Phlebitis; Vasculitis Gastrointestinal Medical History: Negative for: Cirrhosis ; Colitis; Crohns; Hepatitis A; Hepatitis B; Hepatitis C Endocrine Medical History: Negative for: Type I Diabetes; Type II Diabetes Genitourinary Medical History: Negative for: End Stage Renal Disease Immunological Medical History: Negative for: Lupus Erythematosus; Raynauds; Scleroderma Integumentary (Skin) Medical History: Negative for: History of Burn Musculoskeletal Medical History: Negative for: Gout; Rheumatoid Arthritis; Osteoarthritis; Osteomyelitis Neurologic Medical History: Positive for: Dementia Negative for:  Neuropathy; Quadriplegia; Paraplegia; Seizure Disorder Oncologic Medical History: Negative for: Received Chemotherapy; Received Radiation Psychiatric Medical History: Negative for: Anorexia/bulimia; Confinement Anxiety Immunizations Pneumococcal Vaccine: Received Pneumococcal Vaccination: No Implantable Devices None Hospitalization / Surgery History Type of Hospitalization/Surgery OR debridement/ metal plate removal 51/88 Family and Social History Cancer: Yes - Siblings; Diabetes: No; Heart Disease: No; Hereditary Spherocytosis: No; Hypertension: No; Kidney Disease: No; Lung Disease: Yes - Siblings; Seizures: No; Stroke: No; Thyroid Problems: No; Tuberculosis: No; Former smoker; Marital Status - Widowed; Alcohol Use: Never; Drug Use: No History; Caffeine Use: Daily; Financial Concerns: No; Food, Clothing or Shelter Needs: No; Support System Lacking: No; Transportation Concerns: No Psychologist, prison and probation services) Signed: 07/18/2021 3:48:37 PM By: Baltazar Najjar MD Signed: 07/18/2021 4:34:42 PM By: Fonnie Mu RN Entered By: Fonnie Mu on 07/18/2021 11:09:39 -------------------------------------------------------------------------------- SuperBill Details Patient Name: Date of Service: [name] 07/18/2021 Medical Record  Number: [id number] Patient Account Number: [id number] Date of Birth/Sex: Treating RN: 1940-10-06 (80 y.o. F) Primary Care Provider: [name] Other Clinician: Referring Provider: Treating Provider/Extender: Burnetta Sabin in Treatment: 35 Diagnosis Coding ICD-10 Codes Code Description L89.103 Pressure ulcer of unspecified part of back, stage 3 L89.323 Pressure ulcer of left buttock, stage 3 T81.31XA Disruption of external operation (surgical) wound, not elsewhere classified, initial encounter Physician Procedures Electronic Signature(s) Signed: 07/18/2021 3:48:37 PM By: Baltazar Najjar MD Entered By: Baltazar Najjar on 07/18/2021 11:31:27

## 2021-08-01 ENCOUNTER — Other Ambulatory Visit: Payer: Self-pay

## 2021-08-01 ENCOUNTER — Encounter (HOSPITAL_BASED_OUTPATIENT_CLINIC_OR_DEPARTMENT_OTHER): Payer: Medicare Other | Admitting: Internal Medicine

## 2021-08-01 DIAGNOSIS — Y838 Other surgical procedures as the cause of abnormal reaction of the patient, or of later complication, without mention of misadventure at the time of the procedure: Secondary | ICD-10-CM | POA: Diagnosis not present

## 2021-08-01 DIAGNOSIS — T8131XA Disruption of external operation (surgical) wound, not elsewhere classified, initial encounter: Secondary | ICD-10-CM | POA: Diagnosis not present

## 2021-08-01 DIAGNOSIS — F0390 Unspecified dementia without behavioral disturbance: Secondary | ICD-10-CM | POA: Diagnosis not present

## 2021-08-01 DIAGNOSIS — I428 Other cardiomyopathies: Secondary | ICD-10-CM | POA: Diagnosis not present

## 2021-08-01 DIAGNOSIS — Z86718 Personal history of other venous thrombosis and embolism: Secondary | ICD-10-CM | POA: Diagnosis not present

## 2021-08-01 DIAGNOSIS — Z7901 Long term (current) use of anticoagulants: Secondary | ICD-10-CM | POA: Diagnosis not present

## 2021-08-01 DIAGNOSIS — I69318 Other symptoms and signs involving cognitive functions following cerebral infarction: Secondary | ICD-10-CM | POA: Diagnosis not present

## 2021-08-01 DIAGNOSIS — L89323 Pressure ulcer of left buttock, stage 3: Secondary | ICD-10-CM | POA: Diagnosis not present

## 2021-08-01 DIAGNOSIS — I10 Essential (primary) hypertension: Secondary | ICD-10-CM | POA: Diagnosis not present

## 2021-08-01 DIAGNOSIS — L89103 Pressure ulcer of unspecified part of back, stage 3: Secondary | ICD-10-CM | POA: Diagnosis not present

## 2021-08-07 NOTE — Progress Notes (Signed)
[name], [name] ([id number]) Visit Report for 08/01/2021 Allergy List Details Patient Name: Date of Service: [name], [name] 08/01/2021 11:15 A M Medical Record Number: [id number] Patient Account Number: [id number] Date of Birth/Sex: Treating RN: 05-17-1941 (80 y.o. [name], [name] Primary Care Angelita Harnack: Nolene Ebbs Other Clinician: Referring Josh Nicolosi: Treating Marvell Tamer/Extender: Lenard Galloway in Treatment: 37 Allergies Active Allergies codeine alteplase Allergy Notes Electronic Signature(s) Signed: 08/01/2021 4:11:41 PM By: Linton Ham MD Entered By: Linton Ham on 08/01/2021 11:45:32 -------------------------------------------------------------------------------- Clinic Level of Care Assessment Details Patient Name: Date of Service: [name], [name] 08/01/2021 11:15 A M Medical Record Number: [id number] Patient Account Number: [id number] Date of Birth/Sex: Treating RN: 08-08-1941 (80 y.o. [name], [name] Primary Care Sharetta Ricchio: Nolene Ebbs Other Clinician: Referring Candra Wegner: Treating Lasheika Ortloff/Extender: Lenard Galloway in Treatment: 37 Clinic Level of Care Assessment Items TOOL 4 Quantity Score X- 1 0 Use when only an EandM is performed on FOLLOW-UP visit ASSESSMENTS - Nursing Assessment / Reassessment X- 1 10 Reassessment of Co-morbidities (includes updates in patient status) X- 1 5 Reassessment of Adherence to Treatment Plan ASSESSMENTS - Wound and Skin A ssessment / Reassessment X - Simple Wound Assessment / Reassessment - one wound 1 5 []  - 0 Complex Wound Assessment / Reassessment - multiple wounds []  - 0 Dermatologic / Skin Assessment (not related to wound area) ASSESSMENTS - Focused Assessment []  - 0 Circumferential Edema Measurements - multi extremities X- 1 10 Nutritional Assessment / Counseling / Intervention []  - 0 Lower Extremity Assessment (monofilament, tuning fork,  pulses) []  - 0 Peripheral Arterial Disease Assessment (using hand held doppler) ASSESSMENTS - Ostomy and/or Continence Assessment and Care []  - 0 Incontinence Assessment and Management []  - 0 Ostomy Care Assessment and Management (repouching, etc.) PROCESS - Coordination of Care []  - 0 Simple Patient / Family Education for ongoing care X- 1 20 Complex (extensive) Patient / Family Education for ongoing care X- 1 10 Staff obtains Consents, Records, T Results / Process Orders est X- 1 10 Staff telephones HHA, Nursing Homes / Clarify orders / etc []  - 0 Routine Transfer to another Facility (non-emergent condition) []  - 0 Routine Hospital Admission (non-emergent condition) []  - 0 New Admissions / Biomedical engineer / Ordering NPWT Apligraf, etc. , []  - 0 Emergency Hospital Admission (emergent condition) []  - 0 Simple Discharge Coordination X- 1 15 Complex (extensive) Discharge Coordination PROCESS - Special Needs []  - 0 Pediatric / Minor Patient Management []  - 0 Isolation Patient Management []  - 0 Hearing / Language / Visual special needs []  - 0 Assessment of Community assistance (transportation, D/C planning, etc.) []  - 0 Additional assistance / Altered mentation []  - 0 Support Surface(s) Assessment (bed, cushion, seat, etc.) INTERVENTIONS - Wound Cleansing / Measurement []  - 0 Simple Wound Cleansing - one wound []  - 0 Complex Wound Cleansing - multiple wounds []  - 0 Wound Imaging (photographs - any number of wounds) []  - 0 Wound Tracing (instead of photographs) []  - 0 Simple Wound Measurement - one wound []  - 0 Complex Wound Measurement - multiple wounds INTERVENTIONS - Wound Dressings []  - 0 Small Wound Dressing one or multiple wounds []  - 0 Medium Wound Dressing one or multiple wounds []  - 0 Large Wound Dressing one or multiple wounds []  - 0 Application of Medications - topical []  - 0 Application of Medications - injection INTERVENTIONS -  Miscellaneous []  - 0 External ear exam []  - 0 Specimen Collection (cultures, biopsies, blood, body fluids, etc.) []  -  0 Specimen(s) / Culture(s) sent or taken to Lab for analysis []  - 0 Patient Transfer (multiple staff / Harrel Lemon Lift / Similar devices) []  - 0 Simple Staple / Suture removal (25 or less) []  - 0 Complex Staple / Suture removal (26 or more) []  - 0 Hypo / Hyperglycemic Management (close monitor of Blood Glucose) []  - 0 Ankle / Brachial Index (ABI) - do not check if billed separately []  - 0 Vital Signs Has the patient been seen at the hospital within the last three years: Yes Total Score: 85 Level Of Care: New/Established - Level 3 Electronic Signature(s) Signed: 08/07/2021 1:16:02 PM By: Rhae Hammock RN Entered By: Rhae Hammock on 08/02/2021 17:10:55 -------------------------------------------------------------------------------- Patient/Caregiver Education Details Patient Name: Date of Service: [name] 12/21/2022andnbsp11:15 [name] Record Number: [id number] Patient Account Number: [id number] Date of Birth/Gender: Treating RN: 02/12/1941 (80 y.o. [name] Primary Care Physician: Nolene Ebbs Other Clinician: Referring Physician: Treating Physician/Extender: Lenard Galloway in Treatment: 72 Education Assessment Education Provided To: Patient Education Topics Provided Pressure: Methods: Explain/Verbal Responses: State content correctly Electronic Signature(s) Signed: 08/07/2021 1:16:02 PM By: Rhae Hammock RN Entered By: Rhae Hammock on 08/01/2021 11:45:32 -------------------------------------------------------------------------------- Wound Assessment Details Patient Name: Date of Service: Sarah Seltzer D. 08/01/2021 11:15 A M Medical Record Number: [id number] Patient Account Number: [id number] Date of Birth/Sex: Treating RN: 1941-04-15 (80 y.o. [name], [name] Primary Care  Kairi Harshbarger: Nolene Ebbs Other Clinician: Referring Christiana Gurevich: Treating Emilianna Barlowe/Extender: Lenard Galloway in Treatment: 37 Wound Status Wound Number: 5 Primary Etiology: Pressure Ulcer Wound Location: Left Gluteus Wound Status: Open Wounding Event: Gradually Appeared Comorbid History: Hypertension, Dementia Date Acquired: 09/12/2020 Weeks Of Treatment: 37 Clustered Wound: No Wound Measurements Length: (cm) 1 Width: (cm) 1 Depth: (cm) 0.6 Area: (cm) 0.785 Volume: (cm) 0.471 Wound Description Classification: Category/Stage III Wound Margin: Well defined, not attached Exudate Amount: Medium Exudate Type: Serosanguineous Exudate Color: red, brown Foul Odor After Cleansing: Slough/Fibrino % Reduction in Area: 66.7% % Reduction in Volume: 80% Epithelialization: None Tunneling: Yes Position (o'clock): 12 Maximum Distance: (cm) 3.5 No No Wound Bed Granulation Amount: Large (67-100%) Exposed Structure Granulation Quality: Red, Hyper-granulation Fascia Exposed: No Necrotic Amount: None Present (0%) Fat Layer (Subcutaneous Tissue) Exposed: Yes Tendon Exposed: No Muscle Exposed: No Joint Exposed: No Bone Exposed: No Electronic Signature(s) Signed: 08/07/2021 1:16:02 PM By: Rhae Hammock RN Entered By: Rhae Hammock on 08/01/2021 11:42:01 -------------------------------------------------------------------------------- Wound Assessment Details Patient Name: Date of Service: Sarah Seltzer D. 08/01/2021 11:15 A M Medical Record Number: [id number] Patient Account Number: [id number] Date of Birth/Sex: Treating RN: 30-Jul-1941 (80 y.o. [name], [name] Primary Care Eddrick Dilone: Nolene Ebbs Other Clinician: Referring Clemencia Helzer: Treating Concepcion Kirkpatrick/Extender: Lenard Galloway in Treatment: 37 Wound Status Wound Number: 6 Primary Etiology: Pressure Ulcer Wound Location: Midline Back Wound Status: Open Wounding Event:  Surgical Injury Date Acquired: 09/12/2020 Weeks Of Treatment: 37 Clustered Wound: No Wound Measurements Length: (cm) 4 Width: (cm) 2 Depth: (cm) 0.4 Area: (cm) 6.283 Volume: (cm) 2.513 % Reduction in Area: 64.4% % Reduction in Volume: 85.8% Wound Description Classification: Category/Stage III Exudate Amount: Medium Exudate Type: Purulent Exudate Color: yellow, brown, green Electronic Signature(s) Signed: 08/07/2021 1:16:02 PM By: Rhae Hammock RN Entered By: Rhae Hammock on 08/01/2021 11:40:43 -------------------------------------------------------------------------------- Vitals Details Patient Name: Date of Service: Sarah Seltzer D. 08/01/2021 11:15 A M Medical Record Number: [id number] Patient Account Number: [id number] Date of Birth/Sex: Treating RN: 22-Jun-1941 (80 y.o. [name], [name] Primary Care Yliana Gravois: Nolene Ebbs Other  Clinician: Referring Jairon Ripberger: Treating Eino Whitner/Extender: Burnetta Sabin in Treatment: 37 Vital Signs Time Taken: 11:17 Temperature (F): 98.7 Height (in): 62 Pulse (bpm): 74 Respiratory Rate (breaths/min): 17 Blood Pressure (mmHg): 134/74 Reference Range: 80 - 120 mg / dl Electronic Signature(s) Signed: 08/07/2021 1:16:02 PM By: Fonnie Mu RN Entered By: Fonnie Mu on 08/01/2021 11:40:08

## 2021-08-08 ENCOUNTER — Telehealth: Payer: Self-pay

## 2021-08-08 NOTE — Telephone Encounter (Signed)
Rn call to scheduled palliative care visit with DR. Reed. F/U scheduled per son request for 09/25/21.

## 2021-08-09 NOTE — Progress Notes (Signed)
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 problem here is that in transporting the patient to our clinic the patient was billed at thousand dollars by ambulance service which apparently was not covered at all by her insurance. I did not really look into the latter but did agree to see her virtually. We saw her today in the accompaniment of her son who was grateful and the patient's home health nurse Pamala Hurry. The patient has 2 open areas. One was a her original surgical wound on her back. We have been using a wound VAC here. The home health nurse reports a lot more pain around the wound although there is no purulent drainage. Palpation of the wound results and discomfort. The other wound is on her left buttock. This is down to 5.7 cm of depth there is no purulent drainage here we have been using gentamicin for treatment of her previous gram-negative's [E. coli] in this area as well as silver alginate packing. The wound orifice is too small to consider many other  dressing 10/12; we saw the patient today with the assistance of the patient's home health nurse. She is not able to be transported to clinic therefore in the interest of ongoing patient care, collaboration with her home health colleagues we have seen her now for the second time via telehealth She has 2 open wounds 1 was her original surgical wound on the back. We stopped the wound VAC last visit because of skin irritation we have been using silver alginate with some good improvement in the wound surface area. However the  drainage on the dressing really looked worrisome. I had ordered a culture last time but apparently there were difficulties with this for 1 reason or another this was not done therefore I have reordered that today. The area on the left buttock is now down to 6 5 cm use silver alginate packing removed with gentamicin 10/26; telehealth visit with the assistance of the patient's home health nurse. The patient's son was present. We do this because of the difficulty transporting the patient to our clinic and the cost of such transport. Culture we did last time of the back wound grew MSSA. I gave him 7 days of Levaquin and topical Bactroban. Home health says the wound is doing better measuring smaller and seems to have less drainage. The remaining area on the left buttock still is 5.7 cm in depth. This has not improved in several weeks. We have been using silver alginate to both wound areas The area on her buttock is not responding. There is minimal to moderate drainage per the home health nurse but no surrounding tenderness demonstrated today 11/9; telehealth visit with the assistance of the patient's home health nurse. The patient's son was also present. We do this because of the difficulty in transporting her to our clinic and the cost of such transport. The original wound was her area on the back with infected hardware. This is really coming nicely. We have been using silver alginate. The  area on the left buttock is now down to 5 cm in depth from 5.7 the last time. This is also improved the patient does not have any tenderness around her wounds. Home health seems quite happy that the wounds are contracting 12/7; monthly telehealth visit in cooperation with the patient's home health nurse. I was present with one of our wound care nurses. I do not believe her son was present today. She has a wound on her back is actually somewhat better where the area on the left buttock still has 5 cm of probing depth. This was at 1 point a stage III wound. The home care nurse relates of bloody drainage. We have been using silver alginate 12/21; 2-week follow-up via telehealth. We have been doing this because of the patient's severe dementia and difficulty in transportation. The patient was accompanied by her home health nurse and the patient's son. Our intake nurse and myself interviewed We have been following 2 wound areas 1 on the back which was initially an infected pressure ulcer extending in the hardware. 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 This is a probing sinus today at 3.7 cm this would be an improvement. We have been using silver alginate packing rope here as well Patient History Unable to Obtain Patient History due to Dementia. Information obtained from Patient. Allergies codeine, alteplase Family History Cancer - Siblings, Lung Disease - Siblings, No family history of Diabetes, Heart Disease, Hereditary Spherocytosis, Hypertension, Kidney Disease, Seizures, Stroke, Thyroid Problems, Tuberculosis. Social History Former smoker, Marital Status - Widowed, Alcohol Use - Never, Drug Use - No History, Caffeine Use - Daily. Medical History Eyes Denies history of Cataracts, Glaucoma, Optic Neuritis Ear/Nose/Mouth/Throat Denies history of Chronic sinus  problems/congestion, Middle ear problems Hematologic/Lymphatic Denies history of Anemia, Hemophilia, Human Immunodeficiency Virus, Lymphedema, Sickle Cell Disease Respiratory Denies history of Aspiration, Asthma, Chronic Obstructive Pulmonary Disease (COPD), Pneumothorax, Sleep Apnea, Tuberculosis Cardiovascular Patient has history  of Hypertension Denies history of Angina, Arrhythmia, Congestive Heart Failure, Coronary Artery Disease, Deep Vein Thrombosis, Hypotension, Myocardial Infarction, Peripheral Arterial Disease, Peripheral Venous Disease, Phlebitis, Vasculitis Gastrointestinal Denies history of Cirrhosis , Colitis, Crohnoos, Hepatitis A, Hepatitis B, Hepatitis C Endocrine Denies history of Type I Diabetes, Type II Diabetes Genitourinary Denies history of End Stage Renal Disease Immunological Denies history of Lupus Erythematosus, Raynaudoos, Scleroderma Integumentary (Skin) Denies history of History of Burn Musculoskeletal Denies history of Gout, Rheumatoid Arthritis, Osteoarthritis, Osteomyelitis Neurologic Patient has history of Dementia Denies history of Neuropathy, Quadriplegia, Paraplegia, Seizure Disorder Oncologic Denies history of Received Chemotherapy, Received Radiation Psychiatric Denies history of Anorexia/bulimia, Confinement Anxiety Hospitalization/Surgery History - OR debridement/ metal plate removal [id number]. Objective Constitutional Sitting or standing Blood Pressure is within target range for patient.. Pulse regular and within target range for patient.Marland Kitchen Respirations regular, non-labored and within target range.. Temperature is normal and within the target range for the patient.Marland Kitchen Appears in no distress. Vitals Time Taken: 11:17 AM, Height: 62 in, Temperature: 98.7 F, Pulse: 74 bpm, Respiratory Rate: 17 breaths/min, Blood Pressure: 134/74 mmHg. General Notes: Wound exam; the area on the back looks healthy and dimensions are better quoted by the home health  nurse. Surface of this looks quite good, healthy granulation oo The area on her left buttock is very small in terms of surface area "a dimension of 3.7 cm today. Integumentary (Hair, Skin) Wound #5 status is Open. Original cause of wound was Gradually Appeared. The date acquired was: 09/12/2020. The wound has been in treatment 37 weeks. The wound is located on the Left Gluteus. The wound measures 1cm length x 1cm width x 0.6cm depth; 0.785cm^2 area and 0.471cm^3 volume. There is Fat Layer (Subcutaneous Tissue) exposed. There is tunneling at 12:00 with a maximum distance of 3.5cm. There is a medium amount of serosanguineous drainage noted. The wound margin is well defined and not attached to the wound base. There is large (67-100%) red, hyper - granulation within the wound bed. There is no necrotic tissue within the wound bed. Wound #6 status is Open. Original cause of wound was Surgical Injury. The date acquired was: 09/12/2020. The wound has been in treatment 37 weeks. The wound is located on the Midline Back. The wound measures 4cm length x 2cm width x 0.4cm depth; 6.283cm^2 area and 2.513cm^3 volume. There is a medium amount of purulent drainage noted. Assessment Active Problems ICD-10 Pressure ulcer of unspecified part of back, stage 3 Pressure ulcer of left buttock, stage 3 Disruption of external operation (surgical) wound, not elsewhere classified, initial encounter Plan Follow-up Appointments: Return appointment in 3 weeks. - Telehealth @ 11 Dr. Dellia Nims!!!! Off-Loading: Low air-loss mattress (Group 2) Turn and reposition every 2 hours Additional Orders / Instructions: Follow Nutritious Diet - Continue ProStat Home Health: No change in wound care orders this week; continue Home Health for wound care. May utilize formulary equivalent dressing for wound treatment orders unless otherwise specified. Other Home Health Orders/Instructions: - [place] for Wound Care 3x  week. Laboratory ordered were: Anerobic culture - Left Glut. Home health to do at home health visit today The following medication(s) was prescribed: gentamicin topical 0.1 % ointment ointment topical to wound with dressing changes starting 08/01/2021 WOUND #5: - Gluteus Wound Laterality: Left Cleanser: Wound Cleanser ([place]) 3 x Per Week/15 Days Discharge Instructions: Cleanse the wound with wound cleanser prior to applying a clean dressing using gauze sponges, not tissue or cotton balls. Topical: Mupirocin Ointment (Home Health) 3 x  Per Week/15 Days Discharge Instructions: Apply Mupirocin (Bactroban) as instructed Prim Dressing: KerraCel Ag Gelling Fiber Dressing, 0.75x12 Ribbon (silver alginate) ([place]) 3 x Per Week/15 Days ary Discharge Instructions: Make sure to lightly pack into tunnel Secondary Dressing: Zetuvit Plus Silicone Border Dressing 4x4 (in/in) ([place]) 3 x Per Week/15 Days Discharge Instructions: Apply silicone border or abd pad with tape over primary dressing as directed. WOUND #6: - Back Wound Laterality: Midline Cleanser: Wound Cleanser (Home Health) 3 x Per Week/15 Days Discharge Instructions: Cleanse the wound with wound cleanser prior to applying a clean dressing using gauze sponges, not tissue or cotton balls. Topical: Mupirocin Ointment (Home Health) 3 x Per Week/15 Days Discharge Instructions: Apply Mupirocin (Bactroban) as instructed Prim Dressing: KerraCel Ag Gelling Fiber Dressing, 0.75x12 Ribbon (silver alginate) ([place]) 3 x Per Week/15 Days ary Discharge Instructions: Make sure to lightly pack into tunnel Secondary Dressing: Zetuvit Plus Silicone Border Dressing 4x4 (in/in) ([place]) 3 x Per Week/15 Days Discharge Instructions: Apply silicone border or abd pad with tape over primary dressing as directed. 1. I continued with the silver alginate to both wound areas. 2. I have renewed the gentamicin topically to the wound on the back 3.  The improvement in dimensions on the buttock wound to 3.7 cm if reproducible are encouraging. I had some thoughts about the need to image this but we have ordered a culture and we will wait for that to come back before considering this further. This should give [place] time for additional virtual visits. Electronic Signature(s) Signed: 08/01/2021 4:11:41 PM By: Linton Ham MD Entered By: Linton Ham on 08/01/2021 11:55:50 -------------------------------------------------------------------------------- HxROS Details Patient Name: Date of Service: Sarah Seltzer D. 08/01/2021 11:15 A M Medical Record Number: [id number] Patient Account Number: [id number] Date of Birth/Sex: Treating RN: [birth year] (80 y.o. [name], [name] Primary Care Provider: Nolene Ebbs Other Clinician: Referring Provider: Treating Provider/Extender: Lenard Galloway in Treatment: 67 Unable to Obtain Patient History due to Dementia Information Obtained From Patient Eyes Medical History: Negative for: Cataracts; Glaucoma; Optic Neuritis Ear/Nose/Mouth/Throat Medical History: Negative for: Chronic sinus problems/congestion; Middle ear problems Hematologic/Lymphatic Medical History: Negative for: Anemia; Hemophilia; Human Immunodeficiency Virus; Lymphedema; Sickle Cell Disease Respiratory Medical History: Negative for: Aspiration; Asthma; Chronic Obstructive Pulmonary Disease (COPD); Pneumothorax; Sleep Apnea; Tuberculosis Cardiovascular Medical History: Positive for: Hypertension Negative for: Angina; Arrhythmia; Congestive Heart Failure; Coronary Artery Disease; Deep Vein Thrombosis; Hypotension; Myocardial Infarction; Peripheral Arterial Disease; Peripheral Venous Disease; Phlebitis; Vasculitis Gastrointestinal Medical History: Negative for: Cirrhosis ; Colitis; Crohns; Hepatitis A; Hepatitis B; Hepatitis C Endocrine Medical History: Negative for: Type I Diabetes; Type II  Diabetes Genitourinary Medical History: Negative for: End Stage Renal Disease Immunological Medical History: Negative for: Lupus Erythematosus; Raynauds; Scleroderma Integumentary (Skin) Medical History: Negative for: History of Burn Musculoskeletal Medical History: Negative for: Gout; Rheumatoid Arthritis; Osteoarthritis; Osteomyelitis Neurologic Medical History: Positive for: Dementia Negative for: Neuropathy; Quadriplegia; Paraplegia; Seizure Disorder Oncologic Medical History: Negative for: Received Chemotherapy; Received Radiation Psychiatric Medical History: Negative for: Anorexia/bulimia; Confinement Anxiety Immunizations Pneumococcal Vaccine: Received Pneumococcal Vaccination: No Implantable Devices None Hospitalization / Surgery History Type of Hospitalization/Surgery OR debridement/ metal plate removal 579FGE Family and Social History Cancer: Yes - Siblings; Diabetes: No; Heart Disease: No; Hereditary Spherocytosis: No; Hypertension: No; Kidney Disease: No; Lung Disease: Yes - Siblings; Seizures: No; Stroke: No; Thyroid Problems: No; Tuberculosis: No; Former smoker; Marital Status - Widowed; Alcohol Use: Never; Drug Use: No History; Caffeine Use: Daily; Financial Concerns: No; Food, Games developer or Shelter  Needs: No; Support System Lacking: No; Transportation Concerns: No Electronic Signature(s) Signed: 08/01/2021 4:11:41 PM By: Linton Ham MD Signed: 08/07/2021 1:16:02 PM By: Rhae Hammock RN Entered By: Rhae Hammock on 08/01/2021 11:44:13 -------------------------------------------------------------------------------- SuperBill Details Patient Name: Date of Service: [name]. 08/01/2021 Medical Record Number: [id number] Patient Account Number: [id number] Date of Birth/Sex: Treating RN: 1940-10-16 (80 y.o. [name], [name] Primary Care Provider: Nolene Ebbs Other Clinician: Referring Provider: Treating Provider/Extender: Lenard Galloway in Treatment: 37 Diagnosis Coding ICD-10 Codes Code Description L89.103 Pressure ulcer of unspecified part of back, stage 3 L89.323 Pressure ulcer of left buttock, stage 3 T81.31XA Disruption of external operation (surgical) wound, not elsewhere classified, initial encounter Facility Procedures CPT4 Code: [id number] Description: 99213 - WOUND CARE VISIT-LEV 3 EST PT Modifier: Quantity: 1 Physician Procedures : CPT4 Code Description Modifier [id number] 99213 - WC PHYS LEVEL 3 - EST PT ICD-10 Diagnosis Description L89.103 Pressure ulcer of unspecified part of back, stage 3 L89.323 Pressure ulcer of left buttock, stage 3 Quantity: 1 Electronic Signature(s) Signed: 08/07/2021 1:16:02 PM By: Rhae Hammock RN Signed: 08/09/2021 5:16:25 PM By: Linton Ham MD Previous Signature: 08/01/2021 4:11:41 PM Version By: Linton Ham MD Entered By: Rhae Hammock on 08/02/2021 17:11:13

## 2021-08-22 ENCOUNTER — Encounter (HOSPITAL_BASED_OUTPATIENT_CLINIC_OR_DEPARTMENT_OTHER): Payer: Medicare Other | Attending: Internal Medicine | Admitting: Internal Medicine

## 2021-08-22 DIAGNOSIS — Z86711 Personal history of pulmonary embolism: Secondary | ICD-10-CM | POA: Insufficient documentation

## 2021-08-22 DIAGNOSIS — Z8673 Personal history of transient ischemic attack (TIA), and cerebral infarction without residual deficits: Secondary | ICD-10-CM | POA: Insufficient documentation

## 2021-08-22 DIAGNOSIS — I428 Other cardiomyopathies: Secondary | ICD-10-CM | POA: Insufficient documentation

## 2021-08-22 DIAGNOSIS — Z86718 Personal history of other venous thrombosis and embolism: Secondary | ICD-10-CM | POA: Diagnosis not present

## 2021-08-22 DIAGNOSIS — L89323 Pressure ulcer of left buttock, stage 3: Secondary | ICD-10-CM | POA: Insufficient documentation

## 2021-08-22 DIAGNOSIS — Y838 Other surgical procedures as the cause of abnormal reaction of the patient, or of later complication, without mention of misadventure at the time of the procedure: Secondary | ICD-10-CM | POA: Insufficient documentation

## 2021-08-22 DIAGNOSIS — L89103 Pressure ulcer of unspecified part of back, stage 3: Secondary | ICD-10-CM | POA: Insufficient documentation

## 2021-08-22 DIAGNOSIS — I10 Essential (primary) hypertension: Secondary | ICD-10-CM | POA: Insufficient documentation

## 2021-08-22 DIAGNOSIS — T8131XA Disruption of external operation (surgical) wound, not elsewhere classified, initial encounter: Secondary | ICD-10-CM | POA: Diagnosis not present

## 2021-08-23 NOTE — Progress Notes (Signed)
[name], [name] ([id number]) Visit Report for 08/22/2021 Allergy List Details Patient Name: Date of Service: [name], [name] 08/22/2021 11:00 A M Medical Record Number: [id number] Patient Account Number: [id number] Date of Birth/Sex: Treating RN: 1941/01/17 (81 y.o. [name], [name] Primary Care Dacia Capers: Nolene Ebbs Other Clinician: Referring Narya Beavin: Treating Jill Stopka/Extender: Lenard Galloway in Treatment: 40 Allergies Active Allergies codeine alteplase Allergy Notes Electronic Signature(s) Signed: 08/23/2021 7:40:11 AM By: Linton Ham MD Entered By: Linton Ham on 08/22/2021 12:13:06 -------------------------------------------------------------------------------- Wound Assessment Details Patient Name: Date of Service: Sarah Seltzer D. 08/22/2021 11:00 A M Medical Record Number: [id number] Patient Account Number: [id number] Date of Birth/Sex: Treating RN: April 28, 1941 (81 y.o. Nancy Fetter Primary Care Emmalynne Courtney: Nolene Ebbs Other Clinician: Referring Zakya Halabi: Treating Ahmia Colford/Extender: Lenard Galloway in Treatment: 40 Wound Status Wound Number: 5 Primary Etiology: Pressure Ulcer Wound Location: Left Gluteus Wound Status: Open Wounding Event: Gradually Appeared Comorbid History: Hypertension, Dementia Date Acquired: 09/12/2020 Weeks Of Treatment: 40 Clustered Wound: No Wound Measurements Length: (cm) 1 Width: (cm) 1 Depth: (cm) 0.6 Area: (cm) 0.785 Volume: (cm) 0.471 % Reduction in Area: 66.7% % Reduction in Volume: 80% Epithelialization: None Tunneling: Yes Location 1 Position (o'clock): 12 Maximum Distance: (cm) 3 Location 2 Position (o'clock): 3 Maximum Distance: (cm) 3.5 Undermining: No Wound Description Classification: Category/Stage III Wound Margin: Well defined, not attached Exudate Amount: Medium Exudate Type: Serosanguineous Exudate Color: red, brown Foul Odor After  Cleansing: No Slough/Fibrino No Wound Bed Granulation Amount: Large (67-100%) Exposed Structure Granulation Quality: Red, Hyper-granulation Fascia Exposed: No Necrotic Amount: None Present (0%) Fat Layer (Subcutaneous Tissue) Exposed: Yes Tendon Exposed: No Muscle Exposed: No Joint Exposed: No Bone Exposed: No Electronic Signature(s) Signed: 08/22/2021 5:58:46 PM By: Levan Hurst RN, BSN Entered By: Levan Hurst on 08/22/2021 16:44:05 -------------------------------------------------------------------------------- Wound Assessment Details Patient Name: Date of Service: Sarah Seltzer D. 08/22/2021 11:00 A M Medical Record Number: [id number] Patient Account Number: [id number] Date of Birth/Sex: Treating RN: 07-26-1941 (81 y.o. Nancy Fetter Primary Care Arsalan Brisbin: Nolene Ebbs Other Clinician: Referring Margo Lama: Treating Makaleigh Reinard/Extender: Lenard Galloway in Treatment: 40 Wound Status Wound Number: 6 Primary Etiology: Pressure Ulcer Wound Location: Midline Back Wound Status: Open Wounding Event: Surgical Injury Comorbid History: Hypertension, Dementia Date Acquired: 09/12/2020 Weeks Of Treatment: 40 Clustered Wound: No Wound Measurements Length: (cm) 3.5 Width: (cm) 2 Depth: (cm) 0.5 Area: (cm) 5.498 Volume: (cm) 2.749 % Reduction in Area: 68.9% % Reduction in Volume: 84.4% Epithelialization: None Tunneling: No Undermining: No Wound Description Classification: Category/Stage III Exudate Amount: Medium Exudate Type: Purulent Exudate Color: yellow, brown, green Foul Odor After Cleansing: No Slough/Fibrino No Wound Bed Granulation Amount: Medium (34-66%) Exposed Structure Granulation Quality: Pink Fascia Exposed: No Necrotic Amount: Medium (34-66%) Fat Layer (Subcutaneous Tissue) Exposed: Yes Tendon Exposed: No Muscle Exposed: No Joint Exposed: No Bone Exposed: No Electronic Signature(s) Signed: 08/22/2021 5:58:46 PM By:  Levan Hurst RN, BSN Entered By: Levan Hurst on 08/22/2021 16:44:45

## 2021-08-23 NOTE — Progress Notes (Signed)
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 this area. Tunneling depth at 6.5 cm. Silver alginate packing strip. The area on the back which was the original wound actually looks better we are using a wound VAC in this area. 9/28; at the request of the patient's son who is her POA. The patient was seen by virtual visit. The problem here is that in transporting the patient to our clinic the patient was billed at thousand dollars by ambulance service which apparently was not covered at all by her insurance. I did not really look into the latter but did agree to see her virtually. We saw her today in the accompaniment of her son who was grateful and the patient's home health nurse [name]. The patient has 2 open areas. One was a her original surgical wound on her back. We have been using a wound VAC here. The home health nurse reports a lot more pain around the wound although there is no purulent drainage. Palpation of the wound results and  discomfort. The other wound is on her left buttock. This is down to 5.7 cm of depth there is no purulent drainage here we have been using gentamicin for treatment of her previous gram-negative's [E. coli] in this area as well as silver alginate packing. The wound orifice is too small to consider many other dressing 10/12; we saw the patient today with the assistance of the patient's home health nurse.  She is not able to be transported to clinic therefore in the interest of ongoing patient care, collaboration with her home health colleagues we have seen her now for the second time via telehealth She has 2 open wounds 1 was her original surgical wound on the back. We stopped the wound VAC last visit because of skin irritation we have been using silver alginate with some good improvement in the wound surface area. However the drainage on the dressing really looked worrisome. I had ordered a culture last time but apparently there were difficulties with this for 1 reason or another this was not done therefore I have reordered that today. The area on the left buttock is now down to 6 5 cm use silver alginate packing removed with gentamicin 10/26; telehealth visit with the assistance of the patient's home health nurse. The patient's son was present. We do this because of the difficulty transporting the patient to our clinic and the cost of such transport. Culture we did last time of the back wound grew MSSA. I gave him 7 days of Levaquin and topical Bactroban. Home health says the wound is doing better measuring smaller and seems to have less drainage. The remaining area on the left buttock still is 5.7 cm in depth. This has not improved in several weeks. We have been using silver alginate to both wound areas The area on her buttock is not responding. There is minimal to moderate drainage per the home health nurse but no surrounding tenderness demonstrated today 11/9; telehealth visit with the assistance of  the patient's home health nurse. The patient's son was also present. We do this because of the difficulty in transporting her to our clinic and the cost of such transport. The original wound was her area on the back with infected hardware. This is really coming nicely. We have been using silver alginate. The area on the left buttock is now down to 5 cm in depth from 5.7 the last time. This is also improved the patient does not have any tenderness around her wounds. Home health seems quite happy that the wounds are contracting 12/7; monthly telehealth visit in cooperation with the patient's home health nurse. I was present with one of our wound care nurses. I do not believe her son was present today. She has a wound on her back is actually somewhat better where the area on the left buttock still has 5 cm of probing depth. This was at 1 point a stage III wound. The home care nurse relates of bloody drainage. We have been using silver alginate 12/21; 2-week follow-up via telehealth. We have been doing this because of the patient's severe dementia and difficulty in transportation. The patient was accompanied by her home health nurse and the patient's son. Our intake nurse and myself interviewed We have been following 2 wound areas 1 on the back which was initially an infected pressure ulcer extending in the hardware. 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 This is a probing sinus today at 3.7 cm this would be an improvement. We have been using silver alginate packing rope here as well 08/22/2021 again we had a telehealth visit with this patient, her home health nurse. Her son was also present. Our case Freight forwarder myself and RN. We have been doing this due to the impossibility of transporting this patient to our clinic  for usual reviews. She has an improving surgical wound on  her back both in terms of condition and dimension. We are using gentamicin and silver alginate. On her left buttock, she has a sinus leg wound which is the remanent of an initial pressure ulcer. We were called and notified last week by home health that she now has a separate sinus tract going superiorly. Both of these are about 3 cm. We have been using silver alginate and gentamicin here as well and we were a bit hopeful last time that this was getting better however apparently there is a new sinus tract Allergies codeine, alteplase Assessment Active Problems ICD-10 Pressure ulcer of unspecified part of back, stage 3 Pressure ulcer of left buttock, stage 3 Disruption of external operation (surgical) wound, not elsewhere classified, initial encounter Plan 1. I have not changed the primary dressing which is largely silver alginate and topical gentamicin 2. This appears to be doing well on her back 3. The deterioration in the buttock wounds is concerning. I did bring in her son and the discussion here, she probably requires a CT scan of her pelvis looking specifically at the bone and any fluid collection in this area. He brings up that she cannot be transported, financial issues related to her insurance company not covering nonemergent transport. He thinks this would cost him $1000 4. I am not certain how to get around the transport issues for this patient however I told them that if the she is transported to the hospital for any reason to asked to look at this area. I am not sure how to be more helpful here. Electronic Signature(s) Signed: 08/23/2021 7:40:11 AM By: Linton Ham MD Entered By: Linton Ham on 08/22/2021 12:25:31 -------------------------------------------------------------------------------- SuperBill Details Patient Name: Date of Service: Sarah Seltzer D. 08/22/2021 Medical Record Number: [id number] Patient Account Number: [id number] Date of Birth/Sex: Treating  RN: November 15, 1940 (81 y.o. [name], [name] Primary Care Provider: Nolene [name] Other Clinician: Referring Provider: Treating Provider/Extender: Lenard Galloway in Treatment: 40 Diagnosis Coding ICD-10 Codes Code Description L89.103 Pressure ulcer of unspecified part of back, stage 3 L89.323 Pressure ulcer of left buttock, stage 3 T81.31XA Disruption of external operation (surgical) wound, not elsewhere classified, initial encounter Facility Procedures CPT4 Code: [id number] Description: Telehealth originating site facility fee. Modifier: Quantity: 1 Physician Procedures : CPT4 Code Description Modifier [id number] - WC PHYS LEVEL 3 - EST PT ICD-10 Diagnosis Description L89.103 Pressure ulcer of unspecified part of back, stage 3 L89.323 Pressure ulcer of left buttock, stage 3 T81.31XA Disruption of external operation  (surgical) wound, not elsewhere classified, initial encounter Quantity: 1 Electronic Signature(s) Signed: 08/22/2021 5:58:46 PM By: Levan Hurst RN, BSN Signed: 08/23/2021 7:40:11 AM By: Linton Ham MD Entered By: Levan Hurst on 08/22/2021 17:03:31

## 2021-09-19 ENCOUNTER — Encounter (HOSPITAL_BASED_OUTPATIENT_CLINIC_OR_DEPARTMENT_OTHER): Payer: Medicare Other | Attending: Internal Medicine | Admitting: Internal Medicine

## 2021-09-19 ENCOUNTER — Other Ambulatory Visit: Payer: Self-pay

## 2021-09-19 DIAGNOSIS — Z86718 Personal history of other venous thrombosis and embolism: Secondary | ICD-10-CM | POA: Insufficient documentation

## 2021-09-19 DIAGNOSIS — I10 Essential (primary) hypertension: Secondary | ICD-10-CM | POA: Diagnosis not present

## 2021-09-19 DIAGNOSIS — Z981 Arthrodesis status: Secondary | ICD-10-CM | POA: Diagnosis not present

## 2021-09-19 DIAGNOSIS — Z7901 Long term (current) use of anticoagulants: Secondary | ICD-10-CM | POA: Insufficient documentation

## 2021-09-19 DIAGNOSIS — I69318 Other symptoms and signs involving cognitive functions following cerebral infarction: Secondary | ICD-10-CM | POA: Insufficient documentation

## 2021-09-19 DIAGNOSIS — L89103 Pressure ulcer of unspecified part of back, stage 3: Secondary | ICD-10-CM | POA: Diagnosis not present

## 2021-09-19 DIAGNOSIS — L89323 Pressure ulcer of left buttock, stage 3: Secondary | ICD-10-CM | POA: Diagnosis present

## 2021-09-19 DIAGNOSIS — Z95828 Presence of other vascular implants and grafts: Secondary | ICD-10-CM | POA: Diagnosis not present

## 2021-09-19 DIAGNOSIS — Z87891 Personal history of nicotine dependence: Secondary | ICD-10-CM | POA: Diagnosis not present

## 2021-09-19 DIAGNOSIS — Z86711 Personal history of pulmonary embolism: Secondary | ICD-10-CM | POA: Insufficient documentation

## 2021-09-19 DIAGNOSIS — Z885 Allergy status to narcotic agent status: Secondary | ICD-10-CM | POA: Insufficient documentation

## 2021-09-19 DIAGNOSIS — I428 Other cardiomyopathies: Secondary | ICD-10-CM | POA: Insufficient documentation

## 2021-09-19 DIAGNOSIS — F0150 Vascular dementia without behavioral disturbance: Secondary | ICD-10-CM | POA: Diagnosis not present

## 2021-09-19 NOTE — Progress Notes (Signed)
[name] ([id number]) Visit Report for 09/19/2021 Allergy List Details Patient Name: Date of Service: [name], [name] 09/19/2021 11:00 A M Medical Record Number: [id number] Patient Account Number: [id number] Date of Birth/Sex: Treating RN: 01-23-1941 (81 y.o. [name] Primary Care [place]: Nolene Ebbs Other Clinician: Referring Luciann Gossett: Treating Sinaya Minogue/Extender: Lenard Galloway in [id number] Allergies Active Allergies codeine alteplase Allergy Notes Electronic Signature(s) Signed: 09/19/2021 4:37:10 PM By: Linton Ham MD Entered By: Linton Ham on 09/19/2021 11:23:43 -------------------------------------------------------------------------------- Wound Assessment Details Patient Name: Date of Service: Sarah Seltzer D. 09/19/2021 11:00 A M Medical Record Number: [id number] Patient Account Number: [id number] Date of Birth/Sex: Treating RN: [birth year] (81 y.o. Nancy Fetter Primary Care Devaris Quirk: Nolene Ebbs Other Clinician: Referring Azalia Neuberger: Treating Azure Budnick/Extender: Lenard Galloway in Treatment: 44 Wound Status Wound Number: 5 Primary Etiology: Pressure Ulcer Wound Location: Left Gluteus Wound Status: Open Wounding Event: Gradually Appeared Comorbid History: Hypertension, Dementia Date Acquired: 09/12/2020 Weeks Of Treatment: 44 Clustered Wound: No Wound Measurements Length: (cm) 1 Width: (cm) 1 Depth: (cm) 0.5 Area: (cm) 0.785 Volume: (cm) 0.393 % Reduction in Area: 66.7% % Reduction in Volume: 83.3% Epithelialization: None Tunneling: Yes Position (o'clock): 12 Maximum Distance: (cm) 3 Undermining: No Wound Description Classification: Category/Stage III Wound Margin: Well defined, not attached Exudate Amount: Medium Exudate Type: Serosanguineous Exudate Color: red, brown Foul Odor After Cleansing: No Slough/Fibrino No Wound Bed Granulation Amount: Large (67-100%)  Exposed Structure Granulation Quality: Red, Hyper-granulation Fascia Exposed: No Necrotic Amount: None Present (0%) Fat Layer (Subcutaneous Tissue) Exposed: Yes Tendon Exposed: No Muscle Exposed: No Joint Exposed: No Bone Exposed: No Electronic Signature(s) Signed: 09/19/2021 6:23:21 PM By: Levan Hurst RN, BSN Entered By: Levan Hurst on 09/19/2021 11:11:08 -------------------------------------------------------------------------------- Wound Assessment Details Patient Name: Date of Service: Sarah Seltzer D. 09/19/2021 11:00 A M Medical Record Number: [id number] Patient Account Number: [id number] Date of Birth/Sex: Treating RN: [birth year] (81 y.o. Nancy Fetter Primary Care Sidda Humm: Nolene Ebbs Other Clinician: Referring Valita Righter: Treating Flara Storti/Extender: Lenard Galloway in Treatment: 44 Wound Status Wound Number: 6 Primary Etiology: Pressure Ulcer Wound Location: Midline Back Wound Status: Open Wounding Event: Surgical Injury Comorbid History: Hypertension, Dementia Date Acquired: 09/12/2020 Weeks Of Treatment: 44 Clustered Wound: No Wound Measurements Length: (cm) 4 Width: (cm) 2 Depth: (cm) 0.3 Area: (cm) 6.283 Volume: (cm) 1.885 % Reduction in Area: 64.4% % Reduction in Volume: 89.3% Epithelialization: None Tunneling: No Undermining: No Wound Description Classification: Category/Stage III Wound Margin: Flat and Intact Exudate Amount: Medium Exudate Type: Purulent Exudate Color: yellow, brown, green Foul Odor After Cleansing: No Slough/Fibrino No Wound Bed Granulation Amount: Medium (34-66%) Exposed Structure Granulation Quality: Pink Fascia Exposed: No Necrotic Amount: Medium (34-66%) Fat Layer (Subcutaneous Tissue) Exposed: Yes Tendon Exposed: No Muscle Exposed: No Joint Exposed: No Bone Exposed: No Electronic Signature(s) Signed: 09/19/2021 6:23:21 PM By: Levan Hurst RN, BSN Entered By: Levan Hurst on  09/19/2021 11:11:39

## 2021-09-20 NOTE — Progress Notes (Signed)
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 area 9/1; culture I did last time of the left buttock showed E. coli. I gave her cephalexin solution. This has been completed however our intake nurse no still noticed some purulent looking drainage from this area. Tunneling depth at 6.5 cm. Silver alginate packing strip. The area on the back which was the original wound actually looks better we are using a wound VAC in this area. 9/28; at the request of the patient's son who is her POA. The patient was seen by virtual visit. The problem here is that in transporting the patient to our clinic the patient was billed at thousand dollars by ambulance service which apparently was not covered at all by her insurance. I did not really look into the latter but did agree to see her virtually. We saw her today in the accompaniment of her son who was grateful and the patient's home health nurse Pamala Hurry. The patient has 2 open areas.  One was a her original surgical wound on her back. We have been using a wound VAC here. The home health nurse reports a lot more pain around the wound although there is no purulent drainage. Palpation of the wound results and discomfort. The other wound is on her left buttock. This is down to 5.7 cm of  depth there is no purulent drainage here we have been using gentamicin for treatment of her previous gram-negative's [E. coli] in this area as well as silver alginate packing. The wound orifice is too small to consider many other dressing 10/12; we saw the patient today with the assistance of the patient's home health nurse. She is not able to be transported to clinic therefore in the interest of ongoing patient care, collaboration with her home health colleagues we have seen her now for the second time via telehealth She has 2 open wounds 1 was her original surgical wound on the back. We stopped the wound VAC last visit because of skin irritation we have been using silver alginate with some good improvement in the wound surface area. However the drainage on the dressing really looked worrisome. I had ordered a culture last time but apparently there were difficulties with this for 1 reason or another this was not done therefore I have reordered that today. The area on the left buttock is now down to 6 5 cm use silver alginate packing removed with gentamicin 10/26; telehealth visit with the assistance of the patient's home health nurse. The patient's son was present. We do this because of the difficulty transporting the patient to our clinic and the cost of such transport. Culture we did last time of the back wound grew MSSA. I gave him 7 days of Levaquin and topical Bactroban. Home health says the wound is doing better measuring smaller and seems to have less drainage. The remaining area on the left buttock still is 5.7 cm in depth. This has not improved in several weeks. We have been using silver  alginate to both wound areas The area on her buttock is not responding. There is minimal to moderate drainage per the home health nurse but no surrounding tenderness demonstrated today 11/9; telehealth visit with the assistance of the patient's home health nurse. The patient's son was also present. We do this because of the difficulty in transporting her to our clinic and the cost of such transport. The original wound was her area on the back with infected hardware. This is really coming nicely. We have been using silver alginate. The area on the left buttock is now down to 5 cm in depth from 5.7 the last time. This is also improved the patient does not have any tenderness around her wounds. Home health seems quite happy that the wounds are contracting 12/7; monthly telehealth visit in cooperation with the patient's home health nurse. I was present with one of our wound care nurses. I do not believe her son was present today. She has a wound on her back is actually somewhat better where the area on the left buttock still has 5 cm of probing depth. This was at 1 point a stage III wound. The home care nurse relates of bloody drainage. We have been using silver alginate 12/21; 2-week follow-up via telehealth. We have been doing this because of the patient's severe dementia and difficulty in transportation. The patient was accompanied by her home health nurse and the patient's son. Our intake nurse and myself interviewed We have been following 2 wound areas 1 on the back which was initially an infected pressure ulcer extending in the hardware. 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 This is a probing sinus today at 3.7 cm this would  be an improvement. We have been using silver alginate packing rope here as well 08/22/2021 again we had a telehealth visit with this patient,  her home health nurse. Her son was also present. Our case Freight forwarder myself and RN. We have been doing this due to the impossibility of transporting this patient to our clinic for usual reviews. She has an improving surgical wound on her back both in terms of condition and dimension. We are using gentamicin and silver alginate. On her left buttock, she has a sinus leg wound which is the remanent of an initial pressure ulcer. We were called and notified last week by home health that she now has a separate sinus tract going superiorly. Both of these are about 3 cm. We have been using silver alginate and gentamicin here as well and we were a bit hopeful last time that this was getting better however apparently there is a new sinus tract 09/19/2021; telemetry health visit with the patient her home health nurse. Myself and our case manager. This is done because of the inability to transport the patient to the clinic. We have been using gentamicin and silver alginate on the original surgical wound on her back. The left buttock wound is a remnant of an initial pressure ulcer. One of the sinus tracts in this area has closed and according to the nurse a lot less purulent drainage Patient History Unable to Obtain Patient History due to Dementia. Information obtained from Patient. Allergies codeine, alteplase Family History Cancer - Siblings, Lung Disease - Siblings, No family history of Diabetes, Heart Disease, Hereditary Spherocytosis, Hypertension, Kidney Disease, Seizures, Stroke, Thyroid Problems, Tuberculosis. Social History Former smoker, Marital Status - Widowed, Alcohol Use - Never, Drug Use - No History, Caffeine Use - Daily. Medical History Eyes Denies history of Cataracts, Glaucoma, Optic Neuritis Ear/Nose/Mouth/Throat Denies history of Chronic sinus problems/congestion, Middle ear problems Hematologic/Lymphatic Denies history of Anemia, Hemophilia, Human Immunodeficiency Virus, Lymphedema,  Sickle Cell Disease Respiratory Denies history of Aspiration, Asthma, Chronic Obstructive Pulmonary Disease (COPD), Pneumothorax, Sleep Apnea, Tuberculosis Cardiovascular Patient has history of Hypertension Denies history of Angina, Arrhythmia, Congestive Heart Failure, Coronary Artery Disease, Deep Vein Thrombosis, Hypotension, Myocardial Infarction, Peripheral Arterial Disease, Peripheral Venous Disease, Phlebitis, Vasculitis Gastrointestinal Denies history of Cirrhosis , Colitis, Crohnoos, Hepatitis A, Hepatitis B, Hepatitis C Endocrine Denies history of Type I Diabetes, Type II Diabetes Genitourinary Denies history of End Stage Renal Disease Immunological Denies history of Lupus Erythematosus, Raynaudoos, Scleroderma Integumentary (Skin) Denies history of History of Burn Musculoskeletal Denies history of Gout, Rheumatoid Arthritis, Osteoarthritis, Osteomyelitis Neurologic Patient has history of Dementia Denies history of Neuropathy, Quadriplegia, Paraplegia, Seizure Disorder Oncologic Denies history of Received Chemotherapy, Received Radiation Psychiatric Denies history of Anorexia/bulimia, Confinement Anxiety Hospitalization/Surgery History - OR debridement/ metal plate removal [id number]. Objective Integumentary (Hair, Skin) Wound #5 status is Open. Original cause of wound was Gradually Appeared. The date acquired was: 09/12/2020. The wound has been in treatment 44 weeks. The wound is located on the Left Gluteus. The wound measures 1cm length x 1cm width x 0.5cm depth; 0.785cm^2 area and 0.393cm^3 volume. There is Fat Layer (Subcutaneous Tissue) exposed. There is no undermining noted, however, there is tunneling at 12:00 with a maximum distance of 3cm. There is a medium amount of serosanguineous drainage noted. The wound margin is well defined and not attached to the wound base. There is large (67-100%) red, hyper - granulation within the wound bed. There is no necrotic tissue  within the wound bed. Wound #6  status is Open. Original cause of wound was Surgical Injury. The date acquired was: 09/12/2020. The wound has been in treatment 44 weeks. The wound is located on the Midline Back. The wound measures 4cm length x 2cm width x 0.3cm depth; 6.283cm^2 area and 1.885cm^3 volume. There is Fat Layer (Subcutaneous Tissue) exposed. There is no tunneling or undermining noted. There is a medium amount of purulent drainage noted. The wound margin is flat and intact. There is medium (34-66%) pink granulation within the wound bed. There is a medium (34-66%) amount of necrotic tissue within the wound bed. Assessment Active Problems ICD-10 Pressure ulcer of unspecified part of back, stage 3 Pressure ulcer of left buttock, stage 3 Disruption of external operation (surgical) wound, not elsewhere classified, initial encounter Plan Follow-up Appointments: Return appointment in 1 month. - Telehealth 10/17/21 at 11:00 Off-Loading: Low air-loss mattress (Group 2) Turn and reposition every 2 hours Additional Orders / Instructions: Follow Nutritious Diet - Continue ProStat Home Health: No change in wound care orders this week; continue Home Health for wound care. May utilize formulary equivalent dressing for wound treatment orders unless otherwise specified. Other Home Health Orders/Instructions: - [facility] for Wound Care 3x week. WOUND #5: - Gluteus Wound Laterality: Left Cleanser: Wound Cleanser (Home Health) 3 x Per Week/30 Days Discharge Instructions: Cleanse the wound with wound cleanser prior to applying a clean dressing using gauze sponges, not tissue or cotton balls. Topical: Gentamicin (Home Health) 3 x Per Week/30 Days Discharge Instructions: As directed by physician Prim Dressing: KerraCel Ag Gelling Fiber Dressing, 0.75x12 Ribbon (silver alginate) ([place]) 3 x Per Week/30 Days ary Discharge Instructions: Make sure to lightly pack into tunnel Secondary  Dressing: Zetuvit Plus Silicone Border Dressing 4x4 (in/in) ([place]) 3 x Per Week/30 Days Discharge Instructions: Apply silicone border or abd pad with tape over primary dressing as directed. WOUND #6: - Back Wound Laterality: Midline Cleanser: Wound Cleanser (Home Health) 3 x Per Week/30 Days Discharge Instructions: Cleanse the wound with wound cleanser prior to applying a clean dressing using gauze sponges, not tissue or cotton balls. Topical: Gentamicin (Home Health) 3 x Per Week/30 Days Discharge Instructions: As directed by physician Prim Dressing: KerraCel Ag Gelling Fiber Dressing, 0.75x12 Ribbon (silver alginate) ([place]) 3 x Per Week/30 Days ary Discharge Instructions: Make sure to lightly pack into tunnel Secondary Dressing: Zetuvit Plus Silicone Border Dressing 4x4 (in/in) ([place]) 3 x Per Week/30 Days Discharge Instructions: Apply silicone border or abd pad with tape over primary dressing as directed. 1. Still using gentamicin and silver alginate 2. I have told the home health nurse that if they see purulent drainage they are welcome to culture this 3. I had mentioned doing a CT scan of the pelvis underneath the sinus tracts however they have problems with affordability of transportation, difficulties moving her etc. Furthermore the wound is actually improved today so I am not pushing forward to this as of yet. Electronic Signature(s) Signed: 09/19/2021 4:37:10 PM By: Linton Ham MD Entered By: Linton Ham on 09/19/2021 11:27:20 -------------------------------------------------------------------------------- HxROS Details Patient Name: Date of Service: [name] 09/19/2021 11:00 A M Medical Record Number: [id number] Patient Account Number: [id number] Date of Birth/Sex: Treating RN: [birth year] (81 y.o. [name] Primary Care Provider: Nolene Ebbs Other Clinician: Referring Provider: Treating Provider/Extender: Lenard Galloway in Treatment: 34 Unable to Obtain Patient History due to Dementia Information Obtained From Patient Eyes Medical History: Negative for: Cataracts; Glaucoma; Optic Neuritis Ear/Nose/Mouth/Throat Medical  History: Negative for: Chronic sinus problems/congestion; Middle ear problems Hematologic/Lymphatic Medical History: Negative for: Anemia; Hemophilia; Human Immunodeficiency Virus; Lymphedema; Sickle Cell Disease Respiratory Medical History: Negative for: Aspiration; Asthma; Chronic Obstructive Pulmonary Disease (COPD); Pneumothorax; Sleep Apnea; Tuberculosis Cardiovascular Medical History: Positive for: Hypertension Negative for: Angina; Arrhythmia; Congestive Heart Failure; Coronary Artery Disease; Deep Vein Thrombosis; Hypotension; Myocardial Infarction; Peripheral Arterial Disease; Peripheral Venous Disease; Phlebitis; Vasculitis Gastrointestinal Medical History: Negative for: Cirrhosis ; Colitis; Crohns; Hepatitis A; Hepatitis B; Hepatitis C Endocrine Medical History: Negative for: Type I Diabetes; Type II Diabetes Genitourinary Medical History: Negative for: End Stage Renal Disease Immunological Medical History: Negative for: Lupus Erythematosus; Raynauds; Scleroderma Integumentary (Skin) Medical History: Negative for: History of Burn Musculoskeletal Medical History: Negative for: Gout; Rheumatoid Arthritis; Osteoarthritis; Osteomyelitis Neurologic Medical History: Positive for: Dementia Negative for: Neuropathy; Quadriplegia; Paraplegia; Seizure Disorder Oncologic Medical History: Negative for: Received Chemotherapy; Received Radiation Psychiatric Medical History: Negative for: Anorexia/bulimia; Confinement Anxiety Immunizations Pneumococcal Vaccine: Received Pneumococcal Vaccination: No Implantable Devices None Hospitalization / Surgery History Type of Hospitalization/Surgery OR debridement/ metal plate removal [id number] Family and Social  History Cancer: Yes - Siblings; Diabetes: No; Heart Disease: No; Hereditary Spherocytosis: No; Hypertension: No; Kidney Disease: No; Lung Disease: Yes - Siblings; Seizures: No; Stroke: No; Thyroid Problems: No; Tuberculosis: No; Former smoker; Marital Status - Widowed; Alcohol Use: Never; Drug Use: No History; Caffeine Use: Daily; Financial Concerns: No; Food, Clothing or Shelter Needs: No; Support System Lacking: No; Transportation Concerns: No Engineer, maintenance) Signed: 09/19/2021 4:36:42 PM By: Baruch Gouty RN, BSN Signed: 09/19/2021 4:37:10 PM By: Linton Ham MD Entered By: Linton Ham on 09/19/2021 11:23:52 -------------------------------------------------------------------------------- SuperBill Details Patient Name: Date of Service: [name] 09/19/2021 Medical Record Number: [id number] Patient Account Number: [id number] Date of Birth/Sex: Treating RN: 1940/08/16 (81 y.o. [name] Primary Care Provider: Nolene Ebbs Other Clinician: Referring Provider: Treating Provider/Extender: Lenard Galloway in Treatment: 44 Diagnosis Coding ICD-10 Codes Code Description L89.103 Pressure ulcer of unspecified part of back, stage 3 L89.323 Pressure ulcer of left buttock, stage 3 T81.31XA Disruption of external operation (surgical) wound, not elsewhere classified, initial encounter Facility Procedures CPT4 Code: [id number] Description: Telehealth originating site facility fee. Modifier: Quantity: 1 Physician Procedures : CPT4 Code Description Modifier [id number] - WC PHYS LEVEL 2 - EST PT ICD-10 Diagnosis Description L89.103 Pressure ulcer of unspecified part of back, stage 3 L89.323 Pressure ulcer of left buttock, stage 3 Quantity: 1 Electronic Signature(s) Signed: 09/19/2021 6:23:21 PM By: Levan Hurst RN, BSN Signed: 09/20/2021 5:29:10 PM By: Linton Ham MD Previous Signature: 09/19/2021 4:37:10 PM Version By: Linton Ham  MD Entered By: Levan Hurst on 09/19/2021 18:20:16

## 2021-09-25 ENCOUNTER — Other Ambulatory Visit: Payer: Self-pay

## 2021-09-25 ENCOUNTER — Other Ambulatory Visit: Payer: Medicare Other | Admitting: Internal Medicine

## 2021-10-17 ENCOUNTER — Other Ambulatory Visit: Payer: Self-pay

## 2021-10-17 ENCOUNTER — Encounter (HOSPITAL_BASED_OUTPATIENT_CLINIC_OR_DEPARTMENT_OTHER): Payer: Medicare Other | Attending: Internal Medicine | Admitting: General Surgery

## 2021-10-17 DIAGNOSIS — I428 Other cardiomyopathies: Secondary | ICD-10-CM | POA: Insufficient documentation

## 2021-10-17 DIAGNOSIS — I69318 Other symptoms and signs involving cognitive functions following cerebral infarction: Secondary | ICD-10-CM | POA: Diagnosis not present

## 2021-10-17 DIAGNOSIS — I10 Essential (primary) hypertension: Secondary | ICD-10-CM | POA: Insufficient documentation

## 2021-10-17 DIAGNOSIS — L89323 Pressure ulcer of left buttock, stage 3: Secondary | ICD-10-CM | POA: Diagnosis present

## 2021-10-17 DIAGNOSIS — F0150 Vascular dementia without behavioral disturbance: Secondary | ICD-10-CM | POA: Insufficient documentation

## 2021-10-17 DIAGNOSIS — Z7901 Long term (current) use of anticoagulants: Secondary | ICD-10-CM | POA: Diagnosis not present

## 2021-10-17 DIAGNOSIS — Z86718 Personal history of other venous thrombosis and embolism: Secondary | ICD-10-CM | POA: Diagnosis not present

## 2021-10-17 NOTE — Progress Notes (Signed)
[name], [name] ([id number]) ?Visit Report for 10/17/2021 ?Wound Assessment Details ?Patient Name: Date of Service: ?[name], [name] 10/17/2021 11:00 A M ?Medical Record Number: [id number] ?Patient Account Number: [id number] ?Date of Birth/Sex: Treating RN: ?[birth year] (81 y.o. [name], [name] ?Primary Care Neha Waight: Nolene Ebbs Other Clinician: ?Referring [name]: ?Treating [name]/Extender: Fredirick Maudlin ?Nolene Ebbs ?Weeks in Treatment: 48 ?Wound Status ?Wound Number: 5 ?Primary Etiology: Pressure Ulcer ?Wound Location: Left Gluteus ?Wound Status: Open ?Wounding Event: Gradually Appeared ?Comorbid History: Hypertension, Dementia ?Date Acquired: 09/12/2020 ?Weeks Of Treatment: 48 ?Clustered Wound: No ?Wound Measurements ?Length: (cm) 1 ?% Reduction in Area: 66.7% ?Width: (cm) 1 ?% Reduction in Volume: 83.3% ?Depth: (cm) 0.5 ?Epithelialization: None ?Area: (cm?) 0.785 ?Tunneling: Yes ?Volume: (cm?) 0.393 ?Position (o'clock): 12 ?Maximum Distance: (cm) 3 ?Wound Description ?Classification: Category/Stage III ?Foul Odor After Cleansing: No ?Wound Margin: Well defined, not attached Slough/Fibrino No ?Exudate Amount: Medium ?Exudate Type: Purulent ?Exudate Color: yellow, brown, green ?Wound Bed ?Granulation Amount: Large (67-100%) Exposed Structure ?Granulation Quality: Red, Hyper-granulation ?Fascia Exposed: No ?Necrotic Amount: None Present (0%) ?Fat Layer (Subcutaneous Tissue) Exposed: Yes ?Tendon Exposed: No ?Muscle Exposed: No ?Joint Exposed: No ?Bone Exposed: No ?Electronic Signature(s) ?Signed: 10/17/2021 6:27:47 PM By: Levan Hurst RN, BSN ?Entered By: Levan Hurst on 10/17/2021 11:27:02 ?-------------------------------------------------------------------------------- ?Wound Assessment Details ?Patient Name: Date of Service: ?[name], [name] 10/17/2021 11:00 A M ?Medical Record Number: [id number] ?Patient Account Number: [id number] ?Date of Birth/Sex: Treating RN: ?07/12/1941 (81 y.o. [name],  [name] ?Primary Care Ricke Kimoto: Nolene Ebbs Other Clinician: ?Referring Roverto Bodmer: ?Treating Avalyn Molino/Extender: Fredirick Maudlin ?Nolene Ebbs ?Weeks in Treatment: 48 ?Wound Status ?Wound Number: 6 ?Primary Etiology: Pressure Ulcer ?Wound Location: Midline Back ?Wound Status: Open ?Wounding Event: Surgical Injury ?Comorbid History: Hypertension, Dementia ?Date Acquired: 09/12/2020 ?Weeks Of Treatment: 48 ?Clustered Wound: No ?Wound Measurements ?Length: (cm) 4 ?Width: (cm) 2 ?Depth: (cm) 0.3 ?Area: (cm?) 6.283 ?Volume: (cm?) 1.885 ?% Reduction in Area: 64.4% ?% Reduction in Volume: 89.3% ?Epithelialization: None ?Tunneling: No ?Undermining: No ?Wound Description ?Classification: Category/Stage III ?Wound Margin: Flat and Intact ?Exudate Amount: Medium ?Exudate Type: Serosanguineous ?Exudate Color: red, brown ?Foul Odor After Cleansing: No ?Slough/Fibrino No ?Wound Bed ?Granulation Amount: Medium (34-66%) Exposed Structure ?Granulation Quality: Pink, Hyper-granulation ?Fascia Exposed: No ?Necrotic Amount: Medium (34-66%) ?Fat Layer (Subcutaneous Tissue) Exposed: Yes ?Tendon Exposed: No ?Muscle Exposed: No ?Joint Exposed: No ?Bone Exposed: No ?Electronic Signature(s) ?Signed: 10/17/2021 6:27:47 PM By: Levan Hurst RN, BSN ?Entered By: Levan Hurst on 10/17/2021 11:27:33 ?

## 2021-10-17 NOTE — Progress Notes (Addendum)
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 error because I did not comment on that and I can see why I would have done this. In any case she is completing her Augmentin and I do not think that will hurt. It does not look like she is being followed any but by anybody for these active dangerous infections however everything looks satisfactory at this point. I will review her last note from infectious disease and see what they wanted to do is follow-up for the thoracic spine wound which was initially infected hardware 4/29; patient presents for 2-week follow-up. Patient has multiple wounds that are located to her back, sacrum and buttocks. She has been using silver collagen with dressing changes. She states she overall feels well today with no questions or concerns. She denies any signs of infection. 5/13; 2-week follow-up. Patient has a large wound which was her surgical site in the lower thoracic spine. This is a fairly large wound with very  significant undermining. She also has an area in the left buttock which also has significant undermining. Sacral wound I think looks better. She had a's small additional wound on the left buttock that is healed. We have been using silver collagen on most of this but also Iodosorb on some of the more sloughy areas 5/27; patient presents for 2-week follow-up. She has been using collagen to all of the wounds. She reports no issues today. She denies signs of infection. She has her wound VAC today with her. She has not had this placed yet. 6/17; 2-3-week follow-up. She has the original infection wound in her back which was her original surgical site. This is in her lower thoracic  spine. This is generally been doing better. Less undermining and no exposed bone Otherwise there is a mixed picture here. The sacral wound is healed however the left buttock wound has significant undermining of 8 cm from 2-5 o'clock maximum of 3 cm. This is a big deterioration. No obvious infection 6/30; 2-week follow-up. She has had the wound VAC on her surgical wound on her back bridging to the left buttock. This started last week 7/21. 3-week follow-up. Home health apparently called about 10 days ago to report they could no longer get the black foam in the tunnel of her left buttock wound. Working around this just did not seem to be in the cards so I think they are using iodoform packing strips. Still using the wound VAC on the back wound which is making decent progress. 8/17 follow-up visit. Left buttock wound apparently with purulent looking drainage. Back wound apparently slightly larger. We are using a wound VAC in this area 9/1; culture I did last time of the left buttock showed E. coli. I gave her cephalexin solution. This has been completed however our intake nurse no still noticed some purulent looking drainage from this area. Tunneling depth at 6.5 cm. Silver alginate packing strip. The area on the back which was the  original wound actually looks better we are using a wound VAC in this area. 9/28; at the request of the patient's son who is her POA. The patient was seen by virtual visit. The problem here is that in transporting the patient to our clinic the patient was billed at thousand dollars by ambulance service which apparently was not covered at all by her insurance. I did not really look into the latter but did agree to see her virtually. We saw her today in the accompaniment of her son who was grateful and the patient's home health nurse Pamala Hurry. The patient has 2 open areas. One was a her original surgical wound on her back. We have been using a wound VAC here. The home health nurse reports a lot more pain around the wound although there is no purulent drainage. Palpation of the wound results and discomfort. The other wound is on her left buttock. This is down to 5.7 cm of depth there is no purulent drainage here we have been using gentamicin for treatment of her previous gram-negative's [E. coli] in this area as well as silver alginate packing. The wound orifice is too small to consider many other dressing 10/12; we saw the patient today with the assistance of the patient's home health nurse. She is not able to be transported to clinic therefore in the interest of ongoing patient care, collaboration with her home health colleagues we have seen her now for the second time via telehealth She has 2 open wounds 1 was her original surgical wound on the back. We stopped the wound VAC last visit because of skin irritation we have been using silver alginate with some good improvement in the wound surface area. However the drainage on the dressing really looked worrisome. I had ordered a culture last time but apparently there were difficulties with this for 1 reason or another this was not done therefore I have reordered that today. The area on the left buttock is now down to 6 5 cm use silver alginate packing  removed with gentamicin 10/26; telehealth visit with the assistance of the patient's home health nurse. The patient's son was present. We do this because of the difficulty transporting the patient to our  clinic and the cost of such transport. Culture we did last time of the back wound grew MSSA. I gave him 7 days of Levaquin and topical Bactroban. Home health says the wound is doing better measuring smaller and seems to have less drainage. The remaining area on the left buttock still is 5.7 cm in depth. This has not improved in several weeks. We have been using silver alginate to both wound areas The area on her buttock is not responding. There is minimal to moderate drainage per the home health nurse but no surrounding tenderness demonstrated today 11/9; telehealth visit with the assistance of the patient's home health nurse. The patient's son was also present. We do this because of the difficulty in transporting her to our clinic and the cost of such transport. The original wound was her area on the back with infected hardware. This is really coming nicely. We have been using silver alginate. The area on the left buttock is now down to 5 cm in depth from 5.7 the last time. This is also improved the patient does not have any tenderness around her wounds. Home health seems quite happy that the wounds are contracting 12/7; monthly telehealth visit in cooperation with the patient's home health nurse. I was present with one of our wound care nurses. I do not believe her son was present today. She has a wound on her back is actually somewhat better where the area on the left buttock still has 5 cm of probing depth. This was at 1 point a stage III wound. The home care nurse relates of bloody drainage. We have been using silver alginate 12/21; 2-week follow-up via telehealth. We have been doing this because of the patient's severe dementia and difficulty in transportation. The patient was accompanied by her  home health nurse and the patient's son. Our intake nurse and myself interviewed We have been following 2 wound areas 1 on the back which was initially an infected pressure ulcer extending in the hardware. 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 This is a probing sinus today at 3.7 cm this would be an improvement. We have been using silver alginate packing rope here as well 08/22/2021 again we had a telehealth visit with this patient, her home health nurse. Her son was also present. Our case Freight forwarder myself and RN. We have been doing this due to the impossibility of transporting this patient to our clinic for usual reviews. She has an improving surgical wound on her back both in terms of condition and dimension. We are using gentamicin and silver alginate. On her left buttock, she has a sinus leg wound which is the remanent of an initial pressure ulcer. We were called and notified last week by home health that she now has a separate sinus tract going superiorly. Both of these are about 3 cm. We have been using silver alginate and gentamicin here as well and we were a bit hopeful last time that this was getting better however apparently there is a new sinus tract 09/19/2021; telemetry health visit with the patient her home health nurse. Myself and our case manager. This is done because of the inability to transport the patient to the clinic. We have been using gentamicin and silver alginate on the original surgical wound on her back. The left buttock wound is a remnant of an initial pressure  ulcer. One of the sinus tracts in this area has closed and according to the nurse a lot less purulent drainage 10/17/2021: T elehealth visit with the patient via her home health nurse. Our case manager and I participated in the call. Due to lack of funds for transportation, she is  unable to be seen in clinic. We have been continuing the gentamicin and silver alginate to the surgical wound. The left buttock wound is closing, but it does have a sinus tract that drains thick greenish-white material that the nurse states does not have a significant odor, but it is fairly copious. She was last cultured in December. Corynebacterium grew. As per Dr. Janalyn Rouse previous notes, he has been hoping to pursue a CT scan to better evaluate the area, due to concern for underlying abscess or other untreated process contributing to this ongoing drainage. The same issue with funds for transportation to the clinic also applies to transportation for CT scan. The patient's nurse reports that the patient's son has saved up about half of what would be required to transport her for the scan. They will let us know when they are able to arrange this. Images were provided by the home health nurse. I personally reviewed these. There really is not much open on the buttock except for the sinus tract. The old surgical wound on her back appears quite healthy with good granulation tissue. Compared to prior images, this wound has closed up significantly. Objective Integumentary (Hair, Skin) Wound #5 status is Open. Original cause of wound was Gradually Appeared. The date acquired was: 09/12/2020. The wound has been in treatment 48 weeks. The wound is located on the Left Gluteus. The wound measures 1cm length x 1cm width x 0.5cm depth; 0.785cm^2 area and 0.393cm^3 volume. There is Fat Layer (Subcutaneous Tissue) exposed. There is tunneling at 12:00 with a maximum distance of 3cm. There is a medium amount of purulent drainage noted. The wound margin is well defined and not attached to the wound base. There is large (67-100%) red, hyper - granulation within the wound bed. There is no necrotic tissue within the wound bed. Wound #6 status is Open. Original cause of wound was Surgical Injury. The date acquired was:  09/12/2020. The wound has been in treatment 48 weeks. The wound is located on the Midline Back. The wound measures 4cm length x 2cm width x 0.3cm depth; 6.283cm^2 area and 1.885cm^3 volume. There is Fat Layer (Subcutaneous Tissue) exposed. There is no tunneling or undermining noted. There is a medium amount of serosanguineous drainage noted. The wound margin is flat and intact. There is medium (34-66%) pink, hyper - granulation within the wound bed. There is a medium (34-66%) amount of necrotic tissue within the wound bed. Assessment Active Problems ICD-10 Pressure ulcer of unspecified part of back, stage 3 Pressure ulcer of left buttock, stage 3 Disruption of external operation (surgical) wound, not elsewhere classified, initial encounter Plan Follow-up Appointments: Return appointment in 1 month. - Telehealth 11/14/21 at 11:00 Off-Loading: Low air-loss mattress (Group 2) Turn and reposition every 2 hours Additional Orders / Instructions: Follow Nutritious Diet - Continue ProStat Home Health: No change in wound care orders this week; continue Home Health for wound care. May utilize formulary equivalent dressing for wound treatment orders unless otherwise specified. Other Home Health Orders/Instructions: - [facility] for Wound Care 3x week. Laboratory ordered were: Anaerobic culture - Left gluteus - home health to obtain and fax to [phone] WOUND #5: - Gluteus Wound Laterality: Left  Cleanser: Wound Cleanser (Home Health) 3 x Per Week/30 Days Discharge Instructions: Cleanse the wound with wound cleanser prior to applying a clean dressing using gauze sponges, not tissue or cotton balls. Topical: Gentamicin (Home Health) 3 x Per Week/30 Days Discharge Instructions: As directed by physician Prim Dressing: KerraCel Ag Gelling Fiber Dressing, 0.75x12 Ribbon (silver alginate) ([place]) 3 x Per Week/30 Days ary Discharge Instructions: Make sure to lightly pack into  tunnel Secondary Dressing: Zetuvit Plus Silicone Border Dressing 4x4 (in/in) ([place]) 3 x Per Week/30 Days Discharge Instructions: Apply silicone border or abd pad with tape over primary dressing as directed. WOUND #6: - Back Wound Laterality: Midline Cleanser: Wound Cleanser (Home Health) 3 x Per Week/30 Days Discharge Instructions: Cleanse the wound with wound cleanser prior to applying a clean dressing using gauze sponges, not tissue or cotton balls. Topical: Gentamicin (Home Health) 3 x Per Week/30 Days Discharge Instructions: As directed by physician Prim Dressing: KerraCel Ag Gelling Fiber Dressing, 0.75x12 Ribbon (silver alginate) ([place]) 3 x Per Week/30 Days ary Discharge Instructions: Make sure to lightly pack into tunnel Secondary Dressing: Zetuvit Plus Silicone Border Dressing 4x4 (in/in) ([place]) 3 x Per Week/30 Days Discharge Instructions: Apply silicone border or abd pad with tape over primary dressing as directed. 10/17/2021: T elehealth visit. We have been continuing the gentamicin and silver alginate to the surgical wound. The left buttock wound is closing, but it does have a sinus tract that drains thick greenish-white material that the nurse states does not have a significant odor, but it is fairly copious. She was last cultured in December. Corynebacterium grew. As per Dr. Janalyn Rouse previous notes, he has been hoping to pursue a CT scan to better evaluate the area, due to concern for underlying abscess or other untreated process contributing to this ongoing drainage. The same issue with funds for transportation to the clinic also applies to transportation for CT scan. The patient's nurse reports that the patient's son has saved up about half of what would be required to transport her for the scan. They will let us know when they are able to arrange this. Images were provided by the home health nurse. I personally reviewed these. There really is not much open on the  buttock except for the sinus tract. The old surgical wound on her back appears quite healthy with good granulation tissue. Compared to prior images, this wound has closed up significantly. We will continue gentamicin and silver alginate to both wounds. The home health nurse will obtain a culture of the drainage from the buttock site. Once funds are available, we will arrange for CT scan of the pelvis to determine whether or not there is any untreated process contributing to this drainage, as the buttock wound is nearly closed. We will continue to have telehealth visits for the foreseeable future, due to the lack of transportation funds. We will plan on her next 1 in a month. Electronic Signature(s) Signed: 10/17/2021 11:54:20 AM By: Fredirick Maudlin MD FACS Entered By: Fredirick Maudlin on 10/17/2021 11:54:19 -------------------------------------------------------------------------------- SuperBill Details Patient Name: Date of Service: [name] 10/17/2021 Medical Record Number: [id number] Patient Account Number: [id number] Date of Birth/Sex: Treating RN: Dec 27, 1940 (81 y.o. Nancy Fetter Primary Care Provider: Nolene Ebbs Other Clinician: Referring Provider: Treating Provider/Extender: Consuelo Pandy in Treatment: 48 Diagnosis Coding ICD-10 Codes Code Description L89.103 Pressure ulcer of unspecified part of back, stage 3 L89.323 Pressure ulcer of left buttock, stage 3 T81.31XA Disruption of external  operation (surgical) wound, not elsewhere classified, initial encounter Facility Procedures CPT4 Code: [id number] Description: Telehealth originating site facility fee. Modifier: Quantity: 1 Physician Procedures : CPT4 Code Description Modifier [id number] - WC PHYS LEVEL 3 - EST PT ICD-10 Diagnosis Description L89.103 Pressure ulcer of unspecified part of back, stage 3 L89.323 Pressure ulcer of left buttock, stage 3 T81.31XA Disruption of external  operation  (surgical) wound, not elsewhere classified, initial encounter Quantity: 1 Electronic Signature(s) Signed: 10/17/2021 5:13:33 PM By: Fredirick Maudlin MD FACS Signed: 10/17/2021 6:27:47 PM By: Levan Hurst RN, BSN Previous Signature: 10/17/2021 11:54:37 AM Version By: Fredirick Maudlin MD FACS Entered By: Levan Hurst on 10/17/2021 17:06:04

## 2021-11-14 ENCOUNTER — Encounter (HOSPITAL_BASED_OUTPATIENT_CLINIC_OR_DEPARTMENT_OTHER): Payer: Medicare Other | Attending: General Surgery | Admitting: General Surgery

## 2021-11-14 DIAGNOSIS — L89103 Pressure ulcer of unspecified part of back, stage 3: Secondary | ICD-10-CM | POA: Insufficient documentation

## 2021-11-14 DIAGNOSIS — L89323 Pressure ulcer of left buttock, stage 3: Secondary | ICD-10-CM | POA: Insufficient documentation

## 2021-11-14 DIAGNOSIS — Y839 Surgical procedure, unspecified as the cause of abnormal reaction of the patient, or of later complication, without mention of misadventure at the time of the procedure: Secondary | ICD-10-CM | POA: Diagnosis not present

## 2021-11-14 DIAGNOSIS — Z7901 Long term (current) use of anticoagulants: Secondary | ICD-10-CM | POA: Diagnosis not present

## 2021-11-14 DIAGNOSIS — Z86718 Personal history of other venous thrombosis and embolism: Secondary | ICD-10-CM | POA: Insufficient documentation

## 2021-11-14 DIAGNOSIS — T8131XA Disruption of external operation (surgical) wound, not elsewhere classified, initial encounter: Secondary | ICD-10-CM | POA: Insufficient documentation

## 2021-11-14 NOTE — Progress Notes (Addendum)
[name], [name] ([id number]) ?Visit Report for 11/14/2021 ?Allergy List Details ?Patient Name: Date of Service: ?Sites, [name] 11/14/2021 11:00 A M ?Medical Record Number: [id number] ?Patient Account Number: [id number] ?Date of Birth/Sex: Treating RN: ?[birth year] (81 y.o. [name], [name] ?Primary Care Nathaneal Sommers: [name] Other Clinician: ?Referring [name]: ?Treating Jaydien Panepinto/Extender: Fredirick Maudlin ?[name] ?Weeks in Treatment: 52 ?Allergies ?Active Allergies ?codeine ?alteplase ?Allergy Notes ?Electronic Signature(s) ?Signed: 11/14/2021 12:47:59 PM By: Fredirick Maudlin MD FACS ?Entered By: Fredirick Maudlin on 11/14/2021 12:47:59 ?-------------------------------------------------------------------------------- ?Wound Assessment Details ?Patient Name: Date of Service: ?[place], [name] 11/14/2021 11:00 A M ?Medical Record Number: [id number] ?Patient Account Number: [id number] ?Date of Birth/Sex: Treating RN: ?March 31, 1941 (81 y.o. [name], [name] ?Primary Care Jarelly Rinck: [name] Other Clinician: ?Referring Marquisa Salih: ?Treating Glenda Kunst/Extender: Fredirick Maudlin ?[name] ?Weeks in Treatment: 52 ?Wound Status ?Wound Number: 5 ?Primary Etiology: Pressure Ulcer ?Wound Location: Left Gluteus ?Wound Status: Open ?Wounding Event: Gradually Appeared ?Comorbid History: Hypertension, Dementia ?Date Acquired: 09/12/2020 ?Weeks Of Treatment: 52 ?Clustered Wound: No ?Wound Measurements ?Length: (cm) 0.3 ?Width: (cm) 0.5 ?Depth: (cm) 0.5 ?Area: (cm?) 0.118 ?Volume: (cm?) 0.059 ?% Reduction in Area: 95% ?% Reduction in Volume: 97.5% ?Epithelialization: None ?Tunneling: No ?Undermining: Yes ?Starting Position (o'clock): 9 ?Ending Position (o'clock): 1 ?Maximum Distance: (cm) 4 ?Wound Description ?Classification: Category/Stage III ?Wound Margin: Well defined, not attached ?Exudate Amount: Medium ?Exudate Type: Purulent ?Exudate Color: yellow, brown, green ?Foul Odor After Cleansing: No ?Slough/Fibrino  No ?Wound Bed ?Granulation Amount: Large (67-100%) Exposed Structure ?Granulation Quality: Red, Hyper-granulation ?Fascia Exposed: No ?Necrotic Amount: None Present (0%) ?Fat Layer (Subcutaneous Tissue) Exposed: Yes ?Tendon Exposed: No ?Muscle Exposed: No ?Joint Exposed: No ?Bone Exposed: No ?Assessment Notes ?Measurements per Sarah Roch, RN at [place] ?Electronic Signature(s) ?Signed: 11/14/2021 7:00:57 PM By: Levan Hurst RN, BSN ?Entered By: Levan Hurst on 11/14/2021 17:28:17 ?-------------------------------------------------------------------------------- ?Wound Assessment Details ?Patient Name: ?Date of Service: ?[name], [name] 11/14/2021 11:00 A M ?Medical Record Number: [id number] ?Patient Account Number: [id number] ?Date of Birth/Sex: ?Treating RN: ?Feb 27, 1941 (81 y.o. [name], [name] ?Primary Care Markees [name]: [name] ?Other Clinician: ?Referring Chryl Holten: ?Treating [place] Markgraf/Extender: Fredirick Maudlin ?[name] ?Weeks in Treatment: 52 ?Wound Status ?Wound Number: 6 ?Primary Etiology: Pressure Ulcer ?Wound Location: Midline Back ?Wound Status: Open ?Wounding Event: Surgical Injury ?Comorbid History: Hypertension, Dementia ?Date Acquired: 09/12/2020 ?Weeks Of Treatment: 52 ?Clustered Wound: No ?Wound Measurements ?Length: (cm) 2.2 ?Width: (cm) 0.8 ?Depth: (cm) 0.3 ?Area: (cm?) 1.382 ?Volume: (cm?) 0.415 ?% Reduction in Area: 92.2% ?% Reduction in Volume: 97.7% ?Epithelialization: None ?Tunneling: No ?Undermining: No ?Wound Description ?Classification: Category/Stage III ?Wound Margin: Flat and Intact ?Exudate Amount: Medium ?Exudate Type: Serosanguineous ?Exudate Color: red, brown ?Foul Odor After Cleansing: No ?Slough/Fibrino No ?Wound Bed ?Granulation Amount: Medium (34-66%) Exposed Structure ?Granulation Quality: Pink, Hyper-granulation ?Fascia Exposed: No ?Necrotic Amount: Medium (34-66%) ?Fat Layer (Subcutaneous Tissue) Exposed: Yes ?Tendon Exposed: No ?Muscle Exposed: No ?Joint  Exposed: No ?Bone Exposed: No ?Assessment Notes ?Measurements per Sarah Roch, RN at [place] ?Electronic Signature(s) ?Signed: 11/14/2021 7:00:57 PM By: Levan Hurst RN, BSN ?Entered By: Levan Hurst on 11/14/2021 17:28:42 ?

## 2021-11-15 ENCOUNTER — Other Ambulatory Visit (HOSPITAL_COMMUNITY): Payer: Self-pay | Admitting: General Surgery

## 2021-11-15 ENCOUNTER — Other Ambulatory Visit: Payer: Self-pay | Admitting: General Surgery

## 2021-11-15 DIAGNOSIS — L89323 Pressure ulcer of left buttock, stage 3: Secondary | ICD-10-CM

## 2021-11-15 DIAGNOSIS — R102 Pelvic and perineal pain unspecified side: Secondary | ICD-10-CM
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
## 2021-11-28 ENCOUNTER — Ambulatory Visit (HOSPITAL_COMMUNITY)
Admission: RE | Admit: 2021-11-28 | Discharge: 2021-11-28 | Disposition: A | Discharge status: Home / Self Care | Payer: Medicare Other | Source: Ambulatory Visit | Attending: General Surgery | Admitting: General Surgery

## 2021-11-28 DIAGNOSIS — R609 Edema, unspecified: Secondary | ICD-10-CM | POA: Insufficient documentation

## 2021-11-28 DIAGNOSIS — K8020 Calculus of gallbladder without cholecystitis without obstruction: Secondary | ICD-10-CM | POA: Insufficient documentation

## 2021-11-28 DIAGNOSIS — R102 Pelvic and perineal pain: Secondary | ICD-10-CM | POA: Diagnosis present

## 2021-11-28 DIAGNOSIS — L89323 Pressure ulcer of left buttock, stage 3: Secondary | ICD-10-CM | POA: Insufficient documentation

## 2021-11-28 DIAGNOSIS — I700 Atherosclerosis of aorta: Secondary | ICD-10-CM | POA: Insufficient documentation

## 2021-11-28 DIAGNOSIS — K5900 Constipation, unspecified: Secondary | ICD-10-CM | POA: Diagnosis not present

## 2021-11-28 LAB — LIPID PANEL
Cholesterol: 105 mg/dL (ref 0–200)
HDL: 37 mg/dL — ABNORMAL LOW (ref >40)
LDL Cholesterol: 60 mg/dL (ref 0–99)
Total CHOL/HDL Ratio: 2.8 RATIO
Triglycerides: 40 mg/dL (ref <150)
VLDL: 8 mg/dL (ref 0–40)

## 2021-11-28 LAB — CBC
HCT: 46.8 % — ABNORMAL HIGH (ref 36.0–46.0)
Hemoglobin: 14.7 g/dL (ref 12.0–15.0)
MCH: 33.7 pg (ref 26.0–34.0)
MCHC: 31.4 g/dL (ref 30.0–36.0)
MCV: 107.3 fL — ABNORMAL HIGH (ref 80.0–100.0)
Platelets: 96 10*3/uL — ABNORMAL LOW (ref 150–400)
RBC: 4.36 MIL/uL (ref 3.87–5.11)
RDW: 12.4 % (ref 11.5–15.5)
WBC: 8.5 10*3/uL (ref 4.0–10.5)
nRBC: 0 % (ref 0.0–0.2)

## 2021-11-28 LAB — FOLATE: Folate: 40.8 ng/mL (ref >5.9)

## 2021-11-28 LAB — TSH: TSH: 1.205 u[IU]/mL (ref 0.350–4.500)

## 2021-11-28 LAB — COMPREHENSIVE METABOLIC PANEL
ALT: 25 U/L (ref 0–44)
AST: 27 U/L (ref 15–41)
Albumin: 3.1 g/dL — ABNORMAL LOW (ref 3.5–5.0)
Alkaline Phosphatase: 68 U/L (ref 38–126)
Anion gap: 7 (ref 5–15)
BUN: 27 mg/dL — ABNORMAL HIGH (ref 8–23)
CO2: 28 mmol/L (ref 22–32)
Calcium: 9.2 mg/dL (ref 8.9–10.3)
Chloride: 105 mmol/L (ref 98–111)
Creatinine, Ser: 0.39 mg/dL — ABNORMAL LOW (ref 0.44–1.00)
GFR, Estimated: >60 mL/min (ref >60)
Glucose, Bld: 86 mg/dL (ref 70–99)
Potassium: 4.2 mmol/L (ref 3.5–5.1)
Sodium: 140 mmol/L (ref 135–145)
Total Bilirubin: 0.7 mg/dL (ref 0.3–1.2)
Total Protein: 6.7 g/dL (ref 6.5–8.1)

## 2021-11-28 LAB — VITAMIN B12: Vitamin B-12: 2110 pg/mL — ABNORMAL HIGH (ref 180–914)

## 2021-11-28 LAB — POCT I-STAT CREATININE: Creatinine, Ser: 0.5 mg/dL (ref 0.44–1.00)

## 2021-11-28 MED ORDER — IOHEXOL 300 MG/ML  SOLN
100.0000 mL | Freq: Once | INTRAMUSCULAR | Status: AC | PRN
  Administered 2021-11-28: 100 mL via INTRAVENOUS

## 2021-11-28 MED ORDER — SODIUM CHLORIDE (PF) 0.9 % IJ SOLN
INTRAMUSCULAR | Status: AC
  Filled 2021-11-28: qty 50

## 2021-12-12 ENCOUNTER — Encounter (HOSPITAL_BASED_OUTPATIENT_CLINIC_OR_DEPARTMENT_OTHER): Payer: Medicare Other | Admitting: General Surgery

## 2021-12-19 ENCOUNTER — Encounter (HOSPITAL_BASED_OUTPATIENT_CLINIC_OR_DEPARTMENT_OTHER): Payer: Medicare Other | Attending: General Surgery | Admitting: General Surgery

## 2021-12-19 DIAGNOSIS — Z86718 Personal history of other venous thrombosis and embolism: Secondary | ICD-10-CM | POA: Diagnosis not present

## 2021-12-19 DIAGNOSIS — Z8673 Personal history of transient ischemic attack (TIA), and cerebral infarction without residual deficits: Secondary | ICD-10-CM | POA: Insufficient documentation

## 2021-12-19 DIAGNOSIS — Z7901 Long term (current) use of anticoagulants: Secondary | ICD-10-CM | POA: Diagnosis not present

## 2021-12-19 DIAGNOSIS — T8131XA Disruption of external operation (surgical) wound, not elsewhere classified, initial encounter: Secondary | ICD-10-CM | POA: Diagnosis not present

## 2021-12-19 DIAGNOSIS — I43 Cardiomyopathy in diseases classified elsewhere: Secondary | ICD-10-CM | POA: Insufficient documentation

## 2021-12-19 DIAGNOSIS — L89323 Pressure ulcer of left buttock, stage 3: Secondary | ICD-10-CM | POA: Insufficient documentation

## 2021-12-19 DIAGNOSIS — Z981 Arthrodesis status: Secondary | ICD-10-CM | POA: Insufficient documentation

## 2021-12-19 DIAGNOSIS — I119 Hypertensive heart disease without heart failure: Secondary | ICD-10-CM | POA: Diagnosis not present

## 2021-12-19 DIAGNOSIS — X58XXXA Exposure to other specified factors, initial encounter: Secondary | ICD-10-CM | POA: Insufficient documentation

## 2021-12-19 DIAGNOSIS — Z87891 Personal history of nicotine dependence: Secondary | ICD-10-CM | POA: Insufficient documentation

## 2021-12-19 DIAGNOSIS — I428 Other cardiomyopathies: Secondary | ICD-10-CM | POA: Diagnosis not present

## 2021-12-19 DIAGNOSIS — F0150 Vascular dementia without behavioral disturbance: Secondary | ICD-10-CM | POA: Diagnosis not present

## 2021-12-19 DIAGNOSIS — L89103 Pressure ulcer of unspecified part of back, stage 3: Secondary | ICD-10-CM | POA: Insufficient documentation
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
## 2021-12-20 NOTE — Progress Notes (Signed)
[place], [name] ([id number]) ?Visit Report for 12/19/2021 ?Multi Wound Chart Details ?Patient Name: Date of Service: ?[name], [name] 12/19/2021 11:30 A M ?Medical Record Number: [id number] ?Patient Account Number: [id number] ?Date of Birth/Sex: Treating RN: ?08-08-1941 (81 y.o. [name] ?Primary Care [name]: [name] Other Clinician: ?Referring [name]: ?Treating [name]/Extender: [name] ?[name] ?Weeks in Treatment: 59 ?Wound Assessments ?Wound Number: 5 6 N/A ?Photos: No Photos No Photos N/A ?Left Gluteus Midline Back N/A ?Wound Location: ?Gradually Appeared Surgical Injury N/A ?Wounding Event: ?Pressure Ulcer Pressure Ulcer N/A ?Primary Etiology: ?Hypertension, Dementia Hypertension, Dementia N/A ?Comorbid History: ?09/12/2020 09/12/2020 N/A ?Date Acquired: ?33 57 N/A ?Weeks of Treatment: ?Open Open N/A ?Wound Status: ?No No N/A ?Wound Recurrence: ?0.3x0.2x0.1 1x0.5x0.2 N/A ?Measurements L x W x D (cm) ?0.047 0.393 N/A ?A (cm?) : ?rea ?0.005 0.079 N/A ?Volume (cm?) : ?98.00% 97.80% N/A ?% Reduction in A rea: ?99.80% 99.60% N/A ?% Reduction in Volume: ?11 ?Starting Position 1 (o'clock): ?1 ?Ending Position 1 (o'clock): ?3.2 ?Maximum Distance 1 (cm): ?Yes No N/A ?Undermining: ?Category/Stage III Category/Stage III N/A ?Classification: ?Medium Medium N/A ?Exudate A mount: ?Serosanguineous Serosanguineous N/A ?Exudate Type: ?red, brown red, brown N/A ?Exudate Color: ?Well defined, not attached Flat and Intact N/A ?Wound Margin: ?Large (67-100%) Large (67-100%) N/A ?Granulation A mount: ?Red, Hyper-granulation Pink, Hyper-granulation N/A ?Granulation Quality: ?None Present (0%) Small (1-33%) N/A ?Necrotic A mount: ?Fat Layer (Subcutaneous Tissue): Yes Fat Layer (Subcutaneous Tissue): Yes N/A ?Exposed Structures: ?Fascia: No ?Fascia: No ?Tendon: No ?Tendon: No ?Muscle: No ?Muscle: No ?Joint: No ?Joint: No ?Bone: No ?Bone: No ?None None N/A ?Epithelialization: ?Measurements per Sarah Roch,  RN at Measurements per Sarah Roch, RN at N/A ?Assessment Notes: ?[place] ?Treatment Notes ?Electronic Signature(s) ?Signed: 12/19/2021 12:27:42 PM By: Sarah Maudlin MD FACS ?Signed: 12/20/2021 5:21:10 PM By: Levan Hurst RN, BSN ?Entered By: [name] on 12/19/2021 12:27:42 ?-------------------------------------------------------------------------------- ?Wound Assessment Details ?Patient Name: Date of Service: ?[name], [name] 12/19/2021 11:30 A M ?Medical Record Number: [id number] ?Patient Account Number: [id number] ?Date of Birth/Sex: Treating RN: ?08/26/1940 (81 y.o. [name] ?Primary Care [name]: [name] ?Other Clinician: ?Referring [name]: ?Treating [name]/Extender: [name] ?[name] ?Weeks in Treatment: 81 ?Wound Status ?Wound Number: 5 ?Primary Etiology: Pressure Ulcer ?Wound Location: Left Gluteus ?Wound Status: Open ?Wounding Event: Gradually Appeared ?Comorbid History: Hypertension, Dementia ?Date Acquired: 09/12/2020 ?Weeks Of Treatment: 57 ?Clustered Wound: No ?Wound Measurements ?Length: (cm) 0.3 ?Width: (cm) 0.2 ?Depth: (cm) 0.1 ?Area: (cm?) 0.047 ?Volume: (cm?) 0.005 ?% Reduction in Area: 98% ?% Reduction in Volume: 99.8% ?Epithelialization: None ?Tunneling: No ?Undermining: Yes ?Starting Position (o'clock): 11 ?Ending Position (o'clock): 1 ?Maximum Distance: (cm) 3.2 ?Wound Description ?Classification: Category/Stage III ?Wound Margin: Well defined, not attached ?Exudate Amount: Medium ?Exudate Type: Serosanguineous ?Exudate Color: red, brown ?Foul Odor After Cleansing: No ?Slough/Fibrino No ?Wound Bed ?Granulation Amount: Large (67-100%) Exposed Structure ?Granulation Quality: Red, Hyper-granulation ?Fascia Exposed: No ?Necrotic Amount: None Present (0%) ?Fat Layer (Subcutaneous Tissue) Exposed: Yes ?Tendon Exposed: No ?Muscle Exposed: No ?Joint Exposed: No ?Bone Exposed: No ?Assessment Notes ?Measurements per Sarah Roch, RN at  [place] ?Electronic Signature(s) ?Signed: 12/20/2021 5:21:10 PM By: Levan Hurst RN, BSN ?Entered By: Levan Hurst on 12/19/2021 12:11:46 ?-------------------------------------------------------------------------------- ?Wound Assessment Details ?Patient Name: ?Date of Service: ?[name], [name] 12/19/2021 11:30 A M ?Medical Record Number: [id number] ?Patient Account Number: [id number] ?Date of Birth/Sex: ?Treating RN: ?11-22-1940 (81 y.o. [name] ?Primary Care Valisa Karpel: [name] ?Other Clinician: ?Referring [name]: ?Treating [name]/Extender: Sarah Ahr,  Anderson [name] ?[name] ?Weeks in Treatment: 63 ?Wound Status ?Wound Number: 6 ?Primary Etiology: Pressure Ulcer ?Wound Location: Midline Back ?Wound Status: Open ?Wounding Event: Surgical Injury ?Comorbid History: Hypertension, Dementia ?Date Acquired: 09/12/2020 ?Weeks Of Treatment: 57 ?Clustered Wound: No ?Wound Measurements ?Length: (cm) 1 ?Width: (cm) 0.5 ?Depth: (cm) 0.2 ?Area: (cm?) 0.393 ?Volume: (cm?) 0.079 ?% Reduction in Area: 97.8% ?% Reduction in Volume: 99.6% ?Epithelialization: None ?Tunneling: No ?Undermining: No ?Wound Description ?Classification: Category/Stage III ?Wound Margin: Flat and Intact ?Exudate Amount: Medium ?Exudate Type: Serosanguineous ?Exudate Color: red, brown ?Foul Odor After Cleansing: No ?Slough/Fibrino No ?Wound Bed ?Granulation Amount: Large (67-100%) Exposed Structure ?Granulation Quality: Pink, Hyper-granulation ?Fascia Exposed: No ?Necrotic Amount: Small (1-33%) ?Fat Layer (Subcutaneous Tissue) Exposed: Yes ?Tendon Exposed: No ?Muscle Exposed: No ?Joint Exposed: No ?Bone Exposed: No ?Assessment Notes ?Measurements per Sarah Roch, RN at [place] ?Electronic Signature(s) ?Signed: 12/20/2021 5:21:10 PM By: Levan Hurst RN, BSN ?Entered By: Levan Hurst on 12/19/2021 12:13:52 ?

## 2022-01-16 ENCOUNTER — Encounter (HOSPITAL_BASED_OUTPATIENT_CLINIC_OR_DEPARTMENT_OTHER): Payer: Medicare Other | Attending: General Surgery | Admitting: General Surgery

## 2022-01-16 DIAGNOSIS — L89323 Pressure ulcer of left buttock, stage 3: Secondary | ICD-10-CM | POA: Diagnosis not present

## 2022-01-16 DIAGNOSIS — L89103 Pressure ulcer of unspecified part of back, stage 3: Secondary | ICD-10-CM | POA: Insufficient documentation

## 2022-01-16 DIAGNOSIS — F0150 Vascular dementia without behavioral disturbance: Secondary | ICD-10-CM | POA: Diagnosis not present

## 2022-01-16 DIAGNOSIS — T8131XA Disruption of external operation (surgical) wound, not elsewhere classified, initial encounter: Secondary | ICD-10-CM | POA: Diagnosis not present

## 2022-01-16 DIAGNOSIS — Z86711 Personal history of pulmonary embolism: Secondary | ICD-10-CM | POA: Insufficient documentation

## 2022-01-16 DIAGNOSIS — I69319 Unspecified symptoms and signs involving cognitive functions following cerebral infarction: Secondary | ICD-10-CM | POA: Insufficient documentation

## 2022-01-16 DIAGNOSIS — X58XXXA Exposure to other specified factors, initial encounter: Secondary | ICD-10-CM | POA: Insufficient documentation

## 2022-01-16 DIAGNOSIS — K5641 Fecal impaction: Secondary | ICD-10-CM | POA: Insufficient documentation

## 2022-01-16 DIAGNOSIS — Z86718 Personal history of other venous thrombosis and embolism: Secondary | ICD-10-CM | POA: Diagnosis not present

## 2022-01-16 NOTE — Progress Notes (Signed)
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 that she is still taking. She also had a PEG tube insertion for nutrition and hydration. She is receiving promote also through this as a protein supplement. CT scan done during the hospitalization did not suggest acute osteomyelitis of the thoracic spine. They currently are using Santyl wet-to-dry to all wound areas. 4/15; she continues with a large wound with considerable undermining in the lower thoracic spine. She has an area on the sacrum and a tunneling wound on the left buttock. None of these looks particularly infected at this moment and there is no exposed bone in any wound. She is completing the Amoxil and ciprofloxacin We received a call from her pharmacy that I prescribed Augmentin for her and indeed looking at the records that is the case. I think this must have been an error because I did not comment on that and I can see why I would have done this. In any case she is completing her Augmentin and I do not think that  will hurt. It does not look like she is being followed any but by anybody for these active dangerous infections however everything looks satisfactory at this point. I will review her last note from infectious disease and see what they wanted to do is follow-up for the thoracic spine wound which was initially infected hardware 4/29; patient presents for 2-week follow-up. Patient has  multiple wounds that are located to her back, sacrum and buttocks. She has been using silver collagen with dressing changes. She states she overall feels well today with no questions or concerns. She denies any signs of infection. 5/13; 2-week follow-up. Patient has a large wound which was her surgical site in the lower thoracic spine. This is a fairly large wound with very significant undermining. She also has an area in the left buttock which also has significant undermining. Sacral wound I think looks better. She had a's small additional wound on the left buttock that is healed. We have been using silver collagen on most of this but also Iodosorb on some of the more sloughy areas 5/27; patient presents for 2-week follow-up. She has been using collagen to all of the wounds. She reports no issues today. She denies signs of infection. She has her wound VAC today with her. She has not had this placed yet. 6/17; 2-3-week follow-up. She has the original infection wound in her back which was her original surgical site. This is in her lower thoracic spine. This is generally been doing better. Less undermining and no exposed bone Otherwise there is a mixed picture here. The sacral wound is healed however the left buttock wound has significant undermining of 8 cm from 2-5 o'clock maximum of 3 cm. This is a big deterioration. No obvious infection 6/30; 2-week follow-up. She has had the wound VAC on her surgical wound on her back bridging to the left buttock. This started last week 7/21. 3-week follow-up. Home health apparently  called about 10 days ago to report they could no longer get the black foam in the tunnel of her left buttock wound. Working around this just did not seem to be in the cards so I think they are using iodoform packing strips. Still using the wound VAC on the back wound which is making decent progress. 8/17 follow-up visit. Left buttock wound apparently with purulent looking drainage. Back wound apparently slightly larger. We are using a wound VAC in this area 9/1; culture I did last time of the left buttock showed E. coli. I gave her cephalexin solution. This has been completed however our intake nurse no still noticed some purulent looking drainage from this area. Tunneling depth at 6.5 cm. Silver alginate packing strip. The area on the back which was the original wound actually looks better we are using a wound VAC in this area. 9/28; at the request of the patient's son who is her POA. The patient was seen by virtual visit. The problem here is that in transporting the patient to our clinic the patient was billed at thousand dollars by ambulance service which apparently was not covered at all by her insurance. I did not really look into the latter but did agree to see her virtually. We saw her today in the accompaniment of her son who was grateful and the patient's home health nurse Pamala Hurry. The patient has 2 open areas. One was a her original surgical wound on her back. We have been using a wound VAC here. The home health nurse reports a lot more pain around the wound although there is no purulent drainage. Palpation of the wound results and discomfort. The other wound is on her left buttock. This is down to 5.7 cm of depth there is no purulent drainage here we have been using gentamicin for treatment of her previous gram-negative's [E. coli] in this area as well as silver alginate packing. The wound orifice is  too small to consider many other dressing 10/12; we saw the patient today with the  assistance of the patient's home health nurse. She is not able to be transported to clinic therefore in the interest of ongoing patient care, collaboration with her home health colleagues we have seen her now for the second time via telehealth She has 2 open wounds 1 was her original surgical wound on the back. We stopped the wound VAC last visit because of skin irritation we have been using silver alginate with some good improvement in the wound surface area. However the drainage on the dressing really looked worrisome. I had ordered a culture last time but apparently there were difficulties with this for 1 reason or another this was not done therefore I have reordered that today. The area on the left buttock is now down to 6 5 cm use silver alginate packing removed with gentamicin 10/26; telehealth visit with the assistance of the patient's home health nurse. The patient's son was present. We do this because of the difficulty transporting the patient to our clinic and the cost of such transport. Culture we did last time of the back wound grew MSSA. I gave him 7 days of Levaquin and topical Bactroban. Home health says the wound is doing better measuring smaller and seems to have less drainage. The remaining area on the left buttock still is 5.7 cm in depth. This has not improved in several weeks. We have been using silver alginate to both wound areas The area on her buttock is not responding. There is minimal to moderate drainage per the home health nurse but no surrounding tenderness demonstrated today 11/9; telehealth visit with the assistance of the patient's home health nurse. The patient's son was also present. We do this because of the difficulty in transporting her to our clinic and the cost of such transport. The original wound was her area on the back with infected hardware. This is really coming nicely. We have been using silver alginate. The area on the left buttock is now down to 5 cm in  depth from 5.7 the last time. This is also improved the patient does not have any tenderness around her wounds. Home health seems quite happy that the wounds are contracting 12/7; monthly telehealth visit in cooperation with the patient's home health nurse. I was present with one of our wound care nurses. I do not believe her son was present today. She has a wound on her back is actually somewhat better where the area on the left buttock still has 5 cm of probing depth. This was at 1 point a stage III wound. The home care nurse relates of bloody drainage. We have been using silver alginate 12/21; 2-week follow-up via telehealth. We have been doing this because of the patient's severe dementia and difficulty in transportation. The patient was accompanied by her home health nurse and the patient's son. Our intake nurse and myself interviewed We have been following 2 wound areas 1 on the back which was initially an infected pressure ulcer extending in the hardware. 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 This is a probing sinus today at 3.7 cm this would be an improvement. We have been using silver alginate packing rope here as well 08/22/2021 again we had a telehealth visit with this patient, her home health nurse. Her son was also  present. Our case Freight forwarder myself and RN. We have been doing this due to the impossibility of transporting this patient to our clinic for usual reviews. She has an improving surgical wound on her back both in terms of condition and dimension. We are using gentamicin and silver alginate. On her left buttock, she has a sinus leg wound which is the remanent of an initial pressure ulcer. We were called and notified last week by home health that she now has a separate sinus tract going superiorly. Both of these are about 3 cm. We have been using  silver alginate and gentamicin here as well and we were a bit hopeful last time that this was getting better however apparently there is a new sinus tract 09/19/2021; telemetry health visit with the patient her home health nurse. Myself and our case manager. This is done because of the inability to transport the patient to the clinic. We have been using gentamicin and silver alginate on the original surgical wound on her back. The left buttock wound is a remnant of an initial pressure ulcer. One of the sinus tracts in this area has closed and according to the nurse a lot less purulent drainage 10/17/2021: T elehealth visit with the patient via her home health nurse. Our case manager and I participated in the call. Due to lack of funds for transportation, she is unable to be seen in clinic. We have been continuing the gentamicin and silver alginate to the surgical wound. The left buttock wound is closing, but it does have a sinus tract that drains thick greenish-white material that the nurse states does not have a significant odor, but it is fairly copious. She was last cultured in December. Corynebacterium grew. As per Dr. Janalyn Rouse previous notes, he has been hoping to pursue a CT scan to better evaluate the area, due to concern for underlying abscess or other untreated process contributing to this ongoing drainage. The same issue with funds for transportation to the clinic also applies to transportation for CT scan. The patient's nurse reports that the patient's son has saved up about half of what would be required to transport her for the scan. They will let us know when they are able to arrange this. Images were provided by the home health nurse. I personally reviewed these. There really is not much open on the buttock except for the sinus tract. The old surgical wound on her back appears quite healthy with good granulation tissue. Compared to prior images, this wound has closed up  significantly. 11/14/2021: T elehealth visit with the patient via her home health nurse. Her case manager and I participated in the call. We had a culture performed at her last visit, per the home health nurse and this was negative for any growth. The nurse reports that the patient's son has been able to save up the money necessary to transport the patient for CT scan. She also reported that the tract on the buttock has gotten wider, but the orifice has actually narrowed. They are having difficulty removing all of the silver alginate from the wound without it shearing off. The drainage from the site is copious and dark green-brown. The nurse provided images of the buttock wound, as well as the wound on her back. The back wound continues to contract and appears to have a relatively healthy surface. 12/19/2021: T elehealth visit with the patient via her home health nurse. The case manager and I participated in the call. The CT scan that  we had requested was performed on April 19. No abscess was appreciated. She was found to be quite significantly constipated with concern for rectal fecal impaction. T oday, the patient's nurse provides new images. The wound on her back is closing in nicely. There is some heaped up senescent skin around the margin, but due to the inability of the patient to come to clinic, this cannot be debrided. The buttock wound tunnel has decreased in depth. We have been using iodoform packing strips in this location. The external orifice is about the same size and I do not see any evidence of tissue breakdown on the provided images. 01/16/2022: Telehealth visit with the patient via her home health care nurse. The case manager and I participated in the call. They were able to alleviate her fecal impaction with an enema. The home health nurse provided new images. The tunneling on her buttock wound has come in significantly and is now about 3 cm in depth. The wound on her back is much smaller  and appears clean. Patient History Unable to Obtain Patient History due to Dementia. Information obtained from Patient. Family History Cancer - Siblings, Lung Disease - Siblings, No family history of Diabetes, Heart Disease, Hereditary Spherocytosis, Hypertension, Kidney Disease, Seizures, Stroke, Thyroid Problems, Tuberculosis. Social History Former smoker, Marital Status - Widowed, Alcohol Use - Never, Drug Use - No History, Caffeine Use - Daily. Medical History Eyes Denies history of Cataracts, Glaucoma, Optic Neuritis Ear/Nose/Mouth/Throat Denies history of Chronic sinus problems/congestion, Middle ear problems Hematologic/Lymphatic Denies history of Anemia, Hemophilia, Human Immunodeficiency Virus, Lymphedema, Sickle Cell Disease Respiratory Denies history of Aspiration, Asthma, Chronic Obstructive Pulmonary Disease (COPD), Pneumothorax, Sleep Apnea, Tuberculosis Cardiovascular Patient has history of Hypertension Denies history of Angina, Arrhythmia, Congestive Heart Failure, Coronary Artery Disease, Deep Vein Thrombosis, Hypotension, Myocardial Infarction, Peripheral Arterial Disease, Peripheral Venous Disease, Phlebitis, Vasculitis Gastrointestinal Denies history of Cirrhosis , Colitis, Crohnoos, Hepatitis A, Hepatitis B, Hepatitis C Endocrine Denies history of Type I Diabetes, Type II Diabetes Genitourinary Denies history of End Stage Renal Disease Immunological Denies history of Lupus Erythematosus, Raynaudoos, Scleroderma Integumentary (Skin) Denies history of History of Burn Musculoskeletal Denies history of Gout, Rheumatoid Arthritis, Osteoarthritis, Osteomyelitis Neurologic Patient has history of Dementia Denies history of Neuropathy, Quadriplegia, Paraplegia, Seizure Disorder Oncologic Denies history of Received Chemotherapy, Received Radiation Psychiatric Denies history of Anorexia/bulimia, Confinement Anxiety Hospitalization/Surgery History - OR debridement/  metal plate removal [id number]. Objective Integumentary (Hair, Skin) Wound #5 status is Open. Original cause of wound was Gradually Appeared. The date acquired was: 09/12/2020. The wound has been in treatment 61 weeks. The wound is located on the Left Gluteus. The wound measures 0.1cm length x 0.2cm width x 0.2cm depth; 0.016cm^2 area and 0.003cm^3 volume. There is Fat Layer (Subcutaneous Tissue) exposed. There is no tunneling noted, however, there is undermining starting at 11:00 and ending at 1:00 with a maximum distance of 3cm. There is a medium amount of serosanguineous drainage noted. The wound margin is well defined and not attached to the wound base. There is large (67-100%) red, hyper - granulation within the wound bed. There is no necrotic tissue within the wound bed. General Notes: Measurements per Judson Roch, RN at [place] #6 status is Open. Original cause of wound was Surgical Injury. The date acquired was: 09/12/2020. The wound has been in treatment 61 weeks. The wound is located on the Midline Back. The wound measures 0.3cm length x 0.3cm width x 0.2cm depth; 0.071cm^2 area and 0.014cm^3 volume. There is  Fat Layer (Subcutaneous Tissue) exposed. There is no tunneling or undermining noted. There is a medium amount of serosanguineous drainage noted. The wound margin is flat and intact. There is large (67-100%) pink, hyper - granulation within the wound bed. There is a small (1-33%) amount of necrotic tissue within the wound bed. General Notes: Measurements per Judson Roch, RN at [place] Assessment Active Problems ICD-10 Pressure ulcer of unspecified part of back, stage 3 Pressure ulcer of left buttock, stage 3 Disruption of external operation (surgical) wound, not elsewhere classified, initial encounter Plan Follow-up Appointments: Return appointment in 1 month. - Telehealth Wednesday 7/12 at 11:15 am Off-Loading: Low air-loss mattress (Group 2) Turn and reposition  every 2 hours Additional Orders / Instructions: Follow Nutritious Diet - Continue ProStat Home Health: No change in wound care orders this week; continue Home Health for wound care. May utilize formulary equivalent dressing for wound treatment orders unless otherwise specified. Other Home Health Orders/Instructions: - [place] for Wound Care 3x week. WOUND #5: - Gluteus Wound Laterality: Left Cleanser: Wound Cleanser (Home Health) 3 x Per Week/30 Days Discharge Instructions: Cleanse the wound with wound cleanser prior to applying a clean dressing using gauze sponges, not tissue or cotton balls. Topical: Gentamicin (Home Health) 3 x Per Week/30 Days Discharge Instructions: As directed by physician Prim Dressing: Iodoform packing strip 1/4 (in) 3 x Per Week/30 Days ary Discharge Instructions: Lightly pack as instructed Secondary Dressing: Zetuvit Plus Silicone Border Dressing 4x4 (in/in) ([place]) 3 x Per Week/30 Days Discharge Instructions: Apply silicone border or abd pad with tape over primary dressing as directed. WOUND #6: - Back Wound Laterality: Midline Cleanser: Wound Cleanser (Home Health) 3 x Per Week/30 Days Discharge Instructions: Cleanse the wound with wound cleanser prior to applying a clean dressing using gauze sponges, not tissue or cotton balls. Topical: Gentamicin (Home Health) 3 x Per Week/30 Days Discharge Instructions: As directed by physician Prim Dressing: KerraCel Ag Gelling Fiber Dressing, 0.75x12 Ribbon (silver alginate) ([place]) 3 x Per Week/30 Days ary Discharge Instructions: Make sure to lightly pack into tunnel Secondary Dressing: Zetuvit Plus Silicone Border Dressing 4x4 (in/in) ([place]) 3 x Per Week/30 Days Discharge Instructions: Apply silicone border or abd pad with tape over primary dressing as directed. 01/16/2022: The home health nurse provided new images. The tunneling on her buttock wound has come in significantly and is now about  3 cm in depth. The wound on her back is much smaller and appears clean. As this was a telehealth visit, no debridement or personal examination of the wounds could be performed. I think the patient is doing well with her current regimen of topical gentamicin to both wounds with iodoform packing to the buttock and silver alginate to the back. We will have another telehealth visit in 1 month's time. Electronic Signature(s) Signed: 01/16/2022 12:22:21 PM By: Fredirick Maudlin MD FACS Entered By: Fredirick Maudlin on 01/16/2022 12:22:21 -------------------------------------------------------------------------------- HxROS Details Patient Name: Date of Service: Sarah Seltzer D. 01/16/2022 11:15 A M Medical Record Number: [id number] Patient Account Number: [id number] Date of Birth/Sex: Treating RN: [birth year] (81 y.o. Nancy Fetter Primary Care Provider: Nolene Ebbs Other Clinician: Referring Provider: Treating Provider/Extender: Consuelo Pandy in Treatment: 33 Unable to Obtain Patient History due to Dementia Information Obtained From Patient Eyes Medical History: Negative for: Cataracts; Glaucoma; Optic Neuritis Ear/Nose/Mouth/Throat Medical History: Negative for: Chronic sinus problems/congestion; Middle ear problems Hematologic/Lymphatic Medical History: Negative for: Anemia; Hemophilia; Human Immunodeficiency Virus; Lymphedema; Sickle Cell  Disease Respiratory Medical History: Negative for: Aspiration; Asthma; Chronic Obstructive Pulmonary Disease (COPD); Pneumothorax; Sleep Apnea; Tuberculosis Cardiovascular Medical History: Positive for: Hypertension Negative for: Angina; Arrhythmia; Congestive Heart Failure; Coronary Artery Disease; Deep Vein Thrombosis; Hypotension; Myocardial Infarction; Peripheral Arterial Disease; Peripheral Venous Disease; Phlebitis; Vasculitis Gastrointestinal Medical History: Negative for: Cirrhosis ; Colitis; Crohns;  Hepatitis A; Hepatitis B; Hepatitis C Endocrine Medical History: Negative for: Type I Diabetes; Type II Diabetes Genitourinary Medical History: Negative for: End Stage Renal Disease Immunological Medical History: Negative for: Lupus Erythematosus; Raynauds; Scleroderma Integumentary (Skin) Medical History: Negative for: History of Burn Musculoskeletal Medical History: Negative for: Gout; Rheumatoid Arthritis; Osteoarthritis; Osteomyelitis Neurologic Medical History: Positive for: Dementia Negative for: Neuropathy; Quadriplegia; Paraplegia; Seizure Disorder Oncologic Medical History: Negative for: Received Chemotherapy; Received Radiation Psychiatric Medical History: Negative for: Anorexia/bulimia; Confinement Anxiety Immunizations Pneumococcal Vaccine: Received Pneumococcal Vaccination: No Implantable Devices None Hospitalization / Surgery History Type of Hospitalization/Surgery OR debridement/ metal plate removal [id number] Family and Social History Cancer: Yes - Siblings; Diabetes: No; Heart Disease: No; Hereditary Spherocytosis: No; Hypertension: No; Kidney Disease: No; Lung Disease: Yes - Siblings; Seizures: No; Stroke: No; Thyroid Problems: No; Tuberculosis: No; Former smoker; Marital Status - Widowed; Alcohol Use: Never; Drug Use: No History; Caffeine Use: Daily; Financial Concerns: No; Food, Clothing or Shelter Needs: No; Support System Lacking: No; Transportation Concerns: No Engineer, maintenance) Signed: 01/16/2022 2:37:03 PM By: Fredirick Maudlin MD FACS Signed: 01/16/2022 5:22:20 PM By: Levan Hurst RN, BSN Entered By: Levan Hurst on 01/16/2022 11:40:32 -------------------------------------------------------------------------------- [place] Details Patient Name: Date of Service: [name] 01/16/2022 Medical Record Number: [id number] Patient Account Number: [id number] Date of Birth/Sex: Treating RN: 10/13/1940 (81 y.o. Nancy Fetter Primary Care  Provider: Nolene Ebbs Other Clinician: Referring Provider: Treating Provider/Extender: Consuelo Pandy in Treatment: 61 Diagnosis Coding ICD-10 Codes Code Description L89.103 Pressure ulcer of unspecified part of back, stage 3 L89.323 Pressure ulcer of left buttock, stage 3 T81.31XA Disruption of external operation (surgical) wound, not elsewhere classified, initial encounter Facility Procedures CPT4 Code: [id number] Description: Telehealth originating site facility fee. Modifier: Quantity: 1 Physician Procedures : CPT4 Code Description Modifier [id number] - WC PHYS LEVEL 3 - EST PT ICD-10 Diagnosis Description L89.103 Pressure ulcer of unspecified part of back, stage 3 L89.323 Pressure ulcer of left buttock, stage 3 T81.31XA Disruption of external operation  (surgical) wound, not elsewhere classified, initial encounter Quantity: 1 Electronic Signature(s) Signed: 01/16/2022 12:22:40 PM By: Fredirick Maudlin MD FACS Entered By: Fredirick Maudlin on 01/16/2022 12:22:40

## 2022-01-16 NOTE — Progress Notes (Signed)
Blatz, MARELLY RESKE ([id number]) Visit Report for 01/16/2022 Wound Assessment Details Patient Name: Date of Service: [name], [name] 01/16/2022 11:15 A M Medical Record Number: [id number] Patient Account Number: [id number] Date of Birth/Sex: Treating RN: 1941-05-27 (81 y.o. Nancy Fetter Primary Care Mathis Cashman: Nolene Ebbs Other Clinician: Referring Yomayra Tate: Treating Tamber Burtch/Extender: Consuelo Pandy in Treatment: 61 Wound Status Wound Number: 5 Primary Etiology: Pressure Ulcer Wound Location: Left Gluteus Wound Status: Open Wounding Event: Gradually Appeared Comorbid History: Hypertension, Dementia Date Acquired: 09/12/2020 Weeks Of Treatment: 61 Clustered Wound: No Wound Measurements Length: (cm) 0.1 % Reduction in Area: 99.3% Width: (cm) 0.2 % Reduction in Volume: 99.9% Depth: (cm) 0.2 Epithelialization: Small (1-33%) Area: (cm) 0.016 Tunneling: No Volume: (cm) 0.003 Undermining: Yes Starting Position (o'clock): 11 Ending Position (o'clock): 1 Maximum Distance: (cm) 3 Wound Description Classification: Category/Stage III Foul Odor After Cleansing: No Wound Margin: Well defined, not attached Slough/Fibrino No Exudate Amount: Medium Exudate Type: Serosanguineous Exudate Color: red, brown Wound Bed Granulation Amount: Large (67-100%) Exposed Structure Granulation Quality: Red, Hyper-granulation Fascia Exposed: No Necrotic Amount: None Present (0%) Fat Layer (Subcutaneous Tissue) Exposed: Yes Tendon Exposed: No Muscle Exposed: No Joint Exposed: No Bone Exposed: No Assessment Notes Measurements per Judson Roch, RN at [place] Signature(s) Signed: 01/16/2022 5:22:20 PM By: Levan Hurst RN, BSN Entered By: Levan Hurst on 01/16/2022 11:39:44 -------------------------------------------------------------------------------- Wound Assessment Details Patient Name: Date of Service: [name] 01/16/2022 11:15 A  M Medical Record Number: [id number] Patient Account Number: [id number] Date of Birth/Sex: Treating RN: 1941-03-06 (81 y.o. Nancy Fetter Primary Care [name] Lamp: Other Clinician: Nolene Ebbs Referring Vista Sawatzky: Treating Rakel Junio/Extender: Consuelo Pandy in Treatment: 61 Wound Status Wound Number: 6 Primary Etiology: Pressure Ulcer Wound Location: Midline Back Wound Status: Open Wounding Event: Surgical Injury Comorbid History: Hypertension, Dementia Date Acquired: 09/12/2020 Weeks Of Treatment: 61 Clustered Wound: No Wound Measurements Length: (cm) 0.3 Width: (cm) 0.3 Depth: (cm) 0.2 Area: (cm) 0.071 Volume: (cm) 0.014 % Reduction in Area: 99.6% % Reduction in Volume: 99.9% Epithelialization: None Tunneling: No Undermining: No Wound Description Classification: Category/Stage III Wound Margin: Flat and Intact Exudate Amount: Medium Exudate Type: Serosanguineous Exudate Color: red, brown Foul Odor After Cleansing: No Slough/Fibrino No Wound Bed Granulation Amount: Large (67-100%) Exposed Structure Granulation Quality: Pink, Hyper-granulation Fascia Exposed: No Necrotic Amount: Small (1-33%) Fat Layer (Subcutaneous Tissue) Exposed: Yes Tendon Exposed: No Muscle Exposed: No Joint Exposed: No Bone Exposed: No Assessment Notes Measurements per Judson Roch, RN at [place] Signature(s) Signed: 01/16/2022 5:22:20 PM By: Levan Hurst RN, BSN Entered By: Levan Hurst on 01/16/2022 11:40:18

## 2022-02-04 ENCOUNTER — Other Ambulatory Visit: Payer: Self-pay

## 2022-02-04 ENCOUNTER — Encounter (HOSPITAL_COMMUNITY): Payer: Self-pay

## 2022-02-04 ENCOUNTER — Emergency Department (HOSPITAL_BASED_OUTPATIENT_CLINIC_OR_DEPARTMENT_OTHER): Payer: Medicare Other

## 2022-02-04 ENCOUNTER — Emergency Department (HOSPITAL_COMMUNITY)
Admission: EM | Admit: 2022-02-04 | Discharge: 2022-02-05 | Disposition: A | Discharge status: Home / Self Care | Payer: Medicare Other | Attending: Emergency Medicine | Admitting: Emergency Medicine

## 2022-02-04 ENCOUNTER — Emergency Department (HOSPITAL_COMMUNITY): Payer: Medicare Other

## 2022-02-04 DIAGNOSIS — M79609 Pain in unspecified limb: Secondary | ICD-10-CM | POA: Diagnosis not present

## 2022-02-04 DIAGNOSIS — I82441 Acute embolism and thrombosis of right tibial vein: Secondary | ICD-10-CM | POA: Diagnosis not present

## 2022-02-04 DIAGNOSIS — M79661 Pain in right lower leg: Secondary | ICD-10-CM | POA: Diagnosis present

## 2022-02-04 DIAGNOSIS — I10 Essential (primary) hypertension: Secondary | ICD-10-CM | POA: Insufficient documentation

## 2022-02-04 DIAGNOSIS — L03115 Cellulitis of right lower limb: Secondary | ICD-10-CM | POA: Insufficient documentation

## 2022-02-04 DIAGNOSIS — J45909 Unspecified asthma, uncomplicated: Secondary | ICD-10-CM | POA: Insufficient documentation

## 2022-02-04 LAB — COMPREHENSIVE METABOLIC PANEL
ALT: 15 U/L (ref 0–44)
AST: 18 U/L (ref 15–41)
Albumin: 3.2 g/dL — ABNORMAL LOW (ref 3.5–5.0)
Alkaline Phosphatase: 59 U/L (ref 38–126)
Anion gap: 5 (ref 5–15)
BUN: 25 mg/dL — ABNORMAL HIGH (ref 8–23)
CO2: 31 mmol/L (ref 22–32)
Calcium: 9.4 mg/dL (ref 8.9–10.3)
Chloride: 106 mmol/L (ref 98–111)
Creatinine, Ser: 0.65 mg/dL (ref 0.44–1.00)
GFR, Estimated: >60 mL/min (ref >60)
Glucose, Bld: 107 mg/dL — ABNORMAL HIGH (ref 70–99)
Potassium: 3.8 mmol/L (ref 3.5–5.1)
Sodium: 142 mmol/L (ref 135–145)
Total Bilirubin: 1.2 mg/dL (ref 0.3–1.2)
Total Protein: 6.9 g/dL (ref 6.5–8.1)

## 2022-02-04 LAB — CBC WITH DIFFERENTIAL/PLATELET
Abs Immature Granulocytes: 0.06 10*3/uL (ref 0.00–0.07)
Basophils Absolute: 0 10*3/uL (ref 0.0–0.1)
Basophils Relative: 0 %
Eosinophils Absolute: 0.1 10*3/uL (ref 0.0–0.5)
Eosinophils Relative: 1 %
HCT: 44.8 % (ref 36.0–46.0)
Hemoglobin: 14.2 g/dL (ref 12.0–15.0)
Immature Granulocytes: 0 %
Lymphocytes Relative: 13 %
Lymphs Abs: 1.8 10*3/uL (ref 0.7–4.0)
MCH: 33.2 pg (ref 26.0–34.0)
MCHC: 31.7 g/dL (ref 30.0–36.0)
MCV: 104.7 fL — ABNORMAL HIGH (ref 80.0–100.0)
Monocytes Absolute: 1.1 10*3/uL — ABNORMAL HIGH (ref 0.1–1.0)
Monocytes Relative: 8 %
Neutro Abs: 10.4 10*3/uL — ABNORMAL HIGH (ref 1.7–7.7)
Neutrophils Relative %: 78 %
Platelets: 124 10*3/uL — ABNORMAL LOW (ref 150–400)
RBC: 4.28 MIL/uL (ref 3.87–5.11)
RDW: 13 % (ref 11.5–15.5)
WBC: 13.4 10*3/uL — ABNORMAL HIGH (ref 4.0–10.5)
nRBC: 0 % (ref 0.0–0.2)

## 2022-02-04 MED ORDER — DIPHENHYDRAMINE HCL 50 MG/ML IJ SOLN
12.5000 mg | Freq: Once | INTRAMUSCULAR | Status: AC
  Administered 2022-02-04: 12.5 mg via INTRAVENOUS
  Filled 2022-02-04: qty 1

## 2022-02-04 MED ORDER — SODIUM CHLORIDE 0.9 % IV BOLUS
500.0000 mL | Freq: Once | INTRAVENOUS | Status: AC
  Administered 2022-02-04: 500 mL via INTRAVENOUS

## 2022-02-04 MED ORDER — IOHEXOL 300 MG/ML  SOLN
10.0000 mL | Freq: Once | INTRAMUSCULAR | Status: AC | PRN
Start: 2022-02-04 — End: 2022-02-04
  Administered 2022-02-04: 20 mL

## 2022-02-04 MED ORDER — CEPHALEXIN 500 MG PO CAPS: 500.0000 mg | ORAL_CAPSULE | Freq: Four times a day (QID) | ORAL | 0 refills | Status: DC

## 2022-02-04 MED ORDER — MORPHINE SULFATE (PF) 4 MG/ML IV SOLN
4.0000 mg | Freq: Once | INTRAVENOUS | Status: AC
  Administered 2022-02-04: 4 mg via INTRAVENOUS
  Filled 2022-02-04: qty 1

## 2022-02-04 NOTE — ED Triage Notes (Signed)
Pt bib ems for new cellulitis of upper left leg and concern for worsening pressure sores of pt's back.  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  Pt has wound care at home, last seen pt on Saturday.  CBG 121.

## 2022-02-20 ENCOUNTER — Encounter (HOSPITAL_BASED_OUTPATIENT_CLINIC_OR_DEPARTMENT_OTHER): Payer: Medicare Other | Attending: General Surgery | Admitting: General Surgery

## 2022-02-20 DIAGNOSIS — L89103 Pressure ulcer of unspecified part of back, stage 3: Secondary | ICD-10-CM | POA: Diagnosis present

## 2022-02-20 DIAGNOSIS — Y838 Other surgical procedures as the cause of abnormal reaction of the patient, or of later complication, without mention of misadventure at the time of the procedure: Secondary | ICD-10-CM | POA: Insufficient documentation

## 2022-02-20 DIAGNOSIS — T8131XA Disruption of external operation (surgical) wound, not elsewhere classified, initial encounter: Secondary | ICD-10-CM | POA: Insufficient documentation

## 2022-02-20 DIAGNOSIS — L89323 Pressure ulcer of left buttock, stage 3: Secondary | ICD-10-CM | POA: Diagnosis not present

## 2022-02-20 NOTE — Progress Notes (Signed)
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 suggestion that the surgical area on her back almost closed over but then reopened. She also developed areas on the sacrum and 2 areas on the left buttock. Recently she was admitted to hospital from 10/19/2020 through 11/07/2020 with wound infection, hypernatremia, aspiration pneumonia. The thoracic spine wound was felt to be infected culture of this area grew Pseudomonas and Klebsiella she received IV cefepime but was discharged on p.o. Amoxil and ciprofloxacin that she is still taking. She also had a PEG tube insertion for nutrition and hydration. She is receiving promote also through this as a protein supplement. CT scan done during the hospitalization did not suggest acute osteomyelitis of the thoracic spine. They currently are using Santyl wet-to-dry to all wound areas. 4/15; she continues with a large wound with considerable undermining in the lower thoracic spine. She has an area on the sacrum and a  tunneling wound on the left buttock. None of these looks particularly infected at this moment and there is no exposed bone in any wound. She is completing the Amoxil and ciprofloxacin We received a call from her pharmacy that I prescribed Augmentin for her and indeed looking at the records that is the case. I think this must have been an error because I did not comment on that and  I can see why I would have done this. In any case she is completing her Augmentin and I do not think that will hurt. It does not look like she is being followed any but by anybody for these active dangerous infections however everything looks satisfactory at this point. I will review her last note from infectious disease and see what they wanted to do is follow-up for the thoracic spine wound which was initially infected hardware 4/29; patient presents for 2-week follow-up. Patient has multiple wounds that are located to her back, sacrum and buttocks. She has been using silver collagen with dressing changes. She states she overall feels well today with no questions or concerns. She denies any signs of infection. 5/13; 2-week follow-up. Patient has a large wound which was her surgical site in the lower thoracic spine. This is a fairly large wound with very significant undermining. She also has an area in the left buttock which also has significant undermining. Sacral wound I think looks better. She had a's small additional wound on the left buttock that is healed. We have been using silver collagen on most of this but also Iodosorb on some of the more sloughy areas 5/27; patient presents for 2-week follow-up. She has been using collagen to all of the wounds. She reports no issues today. She denies signs of infection. She has her wound VAC today with her. She has not had this placed yet. 6/17; 2-3-week follow-up. She has the original infection wound in her back which was her original surgical site. This is in her lower thoracic  spine. This is generally been doing better. Less undermining and no exposed bone Otherwise there is a mixed picture here. The sacral wound is healed however the left buttock wound has significant undermining of 8 cm from 2-5 o'clock maximum of 3 cm. This is a big deterioration. No obvious infection 6/30; 2-week follow-up. She has had the wound VAC on her surgical wound on her back bridging to the left buttock. This started last week 7/21. 3-week follow-up. Home health apparently called about 10 days ago to report they could no longer get the black foam in the tunnel of her left buttock wound. Working around this just did not seem to be in the cards so I think they are using iodoform packing strips. Still using the wound VAC on the back wound which is making decent progress. 8/17 follow-up visit. Left buttock wound apparently with purulent looking drainage. Back wound apparently slightly larger. We are using a wound VAC in this area 9/1; culture I did last time of the left buttock showed E. coli. I gave her cephalexin solution. This has been completed however our intake nurse no still noticed some purulent looking drainage from this area. Tunneling depth at 6.5 cm. Silver alginate packing strip. The area on the back which was the original wound actually looks better we are using a wound VAC in this area. 9/28; at the request of the patient's son who is her POA. The patient was seen by virtual visit. The problem here is that in transporting the patient to our clinic the patient was billed at thousand dollars by ambulance service which apparently was not covered at all by her insurance. I did not really look into the latter but did agree to see her virtually. We saw her today in the accompaniment of her son who was grateful and the patient's home health nurse Britta Mccreedy. The patient has 2 open areas. One was a her original  surgical wound on her back. We have been using a wound VAC here. The home health  nurse reports a lot more pain around the wound although there is no purulent drainage. Palpation of the wound results and discomfort. The other wound is on her left buttock. This is down to 5.7 cm of depth there is no purulent drainage here we have been using gentamicin for treatment of her previous gram-negative's [E. coli] in this area as well as silver alginate packing. The wound orifice is too small to consider many other dressing 10/12; we saw the patient today with the assistance of the patient's home health nurse. She is not able to be transported to clinic therefore in the interest of ongoing patient care, collaboration with her home health colleagues we have seen her now for the second time via telehealth She has 2 open wounds 1 was her original surgical wound on the back. We stopped the wound VAC last visit because of skin irritation we have been using silver alginate with some good improvement in the wound surface area. However the drainage on the dressing really looked worrisome. I had ordered a culture last time but apparently there were difficulties with this for 1 reason or another this was not done therefore I have reordered that today. The area on the left buttock is now down to 6 5 cm use silver alginate packing removed with gentamicin 10/26; telehealth visit with the assistance of the patient's home health nurse. The patient's son was present. We do this because of the difficulty transporting the patient to our clinic and the cost of such transport. Culture we did last time of the back wound grew MSSA. I gave him 7 days of Levaquin and topical Bactroban. Home health says the wound is doing better measuring smaller and seems to have less drainage. The remaining area on the left buttock still is 5.7 cm in depth. This has not improved in several weeks. We have been using silver alginate to both wound areas The area on her buttock is not responding. There is minimal to moderate drainage  per the home health nurse but no surrounding tenderness demonstrated today 11/9; telehealth visit with the assistance of the patient's home health nurse. The patient's son was also present. We do this because of the difficulty in transporting her to our clinic and the cost of such transport. The original wound was her area on the back with infected hardware. This is really coming nicely. We have been using silver alginate. The area on the left buttock is now down to 5 cm in depth from 5.7 the last time. This is also improved the patient does not have any tenderness around her wounds. Home health seems quite happy that the wounds are contracting 12/7; monthly telehealth visit in cooperation with the patient's home health nurse. I was present with one of our wound care nurses. I do not believe her son was present today. She has a wound on her back is actually somewhat better where the area on the left buttock still has 5 cm of probing depth. This was at 1 point a stage III wound. The home care nurse relates of bloody drainage. We have been using silver alginate 12/21; 2-week follow-up via telehealth. We have been doing this because of the patient's severe dementia and difficulty in transportation. The patient was accompanied by her home health nurse and the patient's son. Our intake nurse and myself interviewed We have been following 2 wound areas 1 on  the back which was initially an infected pressure ulcer extending in the hardware. 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 This is a probing sinus today at 3.7 cm this would be an improvement. We have been using silver alginate packing rope here as well 08/22/2021 again we had a telehealth visit with this patient, her home health nurse. Her son was also present. Our case Freight forwarder myself and RN. We have been doing this due  to the impossibility of transporting this patient to our clinic for usual reviews. She has an improving surgical wound on her back both in terms of condition and dimension. We are using gentamicin and silver alginate. On her left buttock, she has a sinus leg wound which is the remanent of an initial pressure ulcer. We were called and notified last week by home health that she now has a separate sinus tract going superiorly. Both of these are about 3 cm. We have been using silver alginate and gentamicin here as well and we were a bit hopeful last time that this was getting better however apparently there is a new sinus tract 09/19/2021; telemetry health visit with the patient her home health nurse. Myself and our case manager. This is done because of the inability to transport the patient to the clinic. We have been using gentamicin and silver alginate on the original surgical wound on her back. The left buttock wound is a remnant of an initial pressure ulcer. One of the sinus tracts in this area has closed and according to the nurse a lot less purulent drainage 10/17/2021: T elehealth visit with the patient via her home health nurse. Our case manager and I participated in the call. Due to lack of funds for transportation, she is unable to be seen in clinic. We have been continuing the gentamicin and silver alginate to the surgical wound. The left buttock wound is closing, but it does have a sinus tract that drains thick greenish-white material that the nurse states does not have a significant odor, but it is fairly copious. She was last cultured in December. Corynebacterium grew. As per Dr. Janalyn Rouse previous notes, he has been hoping to pursue a CT scan to better evaluate the area, due to concern for underlying abscess or other untreated process contributing to this ongoing drainage. The same issue with funds for transportation to the clinic also applies to transportation for CT scan. The patient's nurse  reports that the patient's son has saved up about half of what would be required to transport her for the scan. They will let us know when they are able to arrange this. Images were provided by the home health nurse. I personally reviewed these. There really is not much open on the buttock except for the sinus tract. The old surgical wound on her back appears quite healthy with good granulation tissue. Compared to prior images, this wound has closed up significantly. 11/14/2021: T elehealth visit with the patient via her home health nurse. Her case manager and I participated in the call. We had a culture performed at her last visit, per the home health nurse and this was negative for any growth. The nurse reports that the patient's son has been able to save up the money necessary to transport the patient for CT scan. She also reported that the tract on the buttock has gotten wider, but the  orifice has actually narrowed. They are having difficulty removing all of the silver alginate from the wound without it shearing off. The drainage from the site is copious and dark green-brown. The nurse provided images of the buttock wound, as well as the wound on her back. The back wound continues to contract and appears to have a relatively healthy surface. 12/19/2021: T elehealth visit with the patient via her home health nurse. The case manager and I participated in the call. The CT scan that we had requested was performed on April 19. No abscess was appreciated. She was found to be quite significantly constipated with concern for rectal fecal impaction. T oday, the patient's nurse provides new images. The wound on her back is closing in nicely. There is some heaped up senescent skin around the margin, but due to the inability of the patient to come to clinic, this cannot be debrided. The buttock wound tunnel has decreased in depth. We have been using iodoform packing strips in this location. The external orifice  is about the same size and I do not see any evidence of tissue breakdown on the provided images. 01/16/2022: Telehealth visit with the patient via her home health care nurse. The case manager and I participated in the call. They were able to alleviate her fecal impaction with an enema. The home health nurse provided new images. The tunneling on her buttock wound has come in significantly and is now about 3 cm in depth. The wound on her back is much smaller and appears clean. 02/20/2022: Telehealth visit with the patient via her home health nurse. The case manager and I participated in the call. New images were provided. She had contacted our clinic with concern about some skin breakdown on the patient's thigh, but this has remained intact and they are applying a DuoDERM to the site. She was apparently seen in a local emergency room for concern of cellulitis and prescribed antibiotics. The erythema has since resolved. The wound on her back is smaller and we are packing this with silver alginate. She continues to have drainage from the buttock wound; the orifice is quite narrow and they are packing this with plain gauze packing strips. The tunnel is about the same length, roughly 3 cm. Patient History Unable to Obtain Patient History due to Dementia. Information obtained from Patient. Allergies codeine, alteplase Family History Cancer - Siblings, Lung Disease - Siblings, No family history of Diabetes, Heart Disease, Hereditary Spherocytosis, Hypertension, Kidney Disease, Seizures, Stroke, Thyroid Problems, Tuberculosis. Social History Former smoker, Marital Status - Widowed, Alcohol Use - Never, Drug Use - No History, Caffeine Use - Daily. Medical History Eyes Denies history of Cataracts, Glaucoma, Optic Neuritis Ear/Nose/Mouth/Throat Denies history of Chronic sinus problems/congestion, Middle ear problems Hematologic/Lymphatic Denies history of Anemia, Hemophilia, Human Immunodeficiency Virus,  Lymphedema, Sickle Cell Disease Respiratory Denies history of Aspiration, Asthma, Chronic Obstructive Pulmonary Disease (COPD), Pneumothorax, Sleep Apnea, Tuberculosis Cardiovascular Patient has history of Hypertension Denies history of Angina, Arrhythmia, Congestive Heart Failure, Coronary Artery Disease, Deep Vein Thrombosis, Hypotension, Myocardial Infarction, Peripheral Arterial Disease, Peripheral Venous Disease, Phlebitis, Vasculitis Gastrointestinal Denies history of Cirrhosis , Colitis, Crohnoos, Hepatitis A, Hepatitis B, Hepatitis C Endocrine Denies history of Type I Diabetes, Type II Diabetes Genitourinary Denies history of End Stage Renal Disease Immunological Denies history of Lupus Erythematosus, Raynaudoos, Scleroderma Integumentary (Skin) Denies history of History of Burn Musculoskeletal Denies history of Gout, Rheumatoid Arthritis, Osteoarthritis, Osteomyelitis Neurologic Patient has history of Dementia Denies history of Neuropathy, Quadriplegia, Paraplegia,  Seizure Disorder Oncologic Denies history of Received Chemotherapy, Received Radiation Psychiatric Denies history of Anorexia/bulimia, Confinement Anxiety Hospitalization/Surgery History - OR debridement/ metal plate removal [id number]. Objective Constitutional Vitals Time Taken: 11:30 AM, Height: 62 in, Temperature: 98.2 F, Pulse: 60 bpm, Respiratory Rate: 16 breaths/min, Blood Pressure: 134/68 mmHg, Capillary Blood Glucose: 98 mg/dl. General Notes: vitals per [facility] RN General Notes: 02/20/2022: Per the images provided by her home health nurse, the back wound is is smaller. The orifice to the buttock wound has narrowed but the tunnel is about the same length. Still with some whitish drainage. Integumentary (Hair, Skin) Wound #5 status is Open. Original cause of wound was Gradually Appeared. The date acquired was: 09/12/2020. The wound has been in treatment 66 weeks. The wound is located on the Left Gluteus. The wound  measures 0.1cm length x 0.2cm width x 0.2cm depth; 0.016cm^2 area and 0.003cm^3 volume. There is Fat Layer (Subcutaneous Tissue) exposed. There is no tunneling noted, however, there is undermining starting at 11:00 and ending at 1:00 with a maximum distance of 3cm. There is a medium amount of serosanguineous drainage noted. The wound margin is well defined and not attached to the wound base. There is large (67-100%) red granulation within the wound bed. There is no necrotic tissue within the wound bed. Wound #6 status is Open. Original cause of wound was Surgical Injury. The date acquired was: 09/12/2020. The wound has been in treatment 66 weeks. The wound is located on the Midline Back. The wound measures 0.2cm length x 0.2cm width x 0.1cm depth; 0.031cm^2 area and 0.003cm^3 volume. There is Fat Layer (Subcutaneous Tissue) exposed. There is no tunneling or undermining noted. There is a none present amount of drainage noted. The wound margin is flat and intact. There is large (67-100%) pink granulation within the wound bed. There is no necrotic tissue within the wound bed. Assessment Active Problems ICD-10 Pressure ulcer of unspecified part of back, stage 3 Pressure ulcer of left buttock, stage 3 Disruption of external operation (surgical) wound, not elsewhere classified, initial encounter Plan Follow-up Appointments: Return appointment in 1 month. - Telehealth Wednesday 8/9 @ 1030 am Off-Loading: Low air-loss mattress (Group 2) Turn and reposition every 2 hours Additional Orders / Instructions: Follow Nutritious Diet - Continue Juven and Nutrin Home Health: New wound care orders this week; continue Home Health for wound care. May utilize formulary equivalent dressing for wound treatment orders unless otherwise specified. - change from plain packing to iodoform packing in glut wound Other Home Health Orders/Instructions: - [place] for Wound Care 3x week. WOUND #5: - Gluteus Wound  Laterality: Left Cleanser: Wound Cleanser (Home Health) 3 x Per Week/30 Days Discharge Instructions: Cleanse the wound with wound cleanser prior to applying a clean dressing using gauze sponges, not tissue or cotton balls. Prim Dressing: Iodoform packing strip 1/4 (in) 3 x Per Week/30 Days ary Discharge Instructions: Lightly pack as instructed Secondary Dressing: Zetuvit Plus Silicone Border Dressing 4x4 (in/in) ([place]) 3 x Per Week/30 Days Discharge Instructions: Apply silicone border or abd pad with tape over primary dressing as directed. WOUND #6: - Back Wound Laterality: Midline Cleanser: Wound Cleanser (Home Health) 3 x Per Week/30 Days Discharge Instructions: Cleanse the wound with wound cleanser prior to applying a clean dressing using gauze sponges, not tissue or cotton balls. Prim Dressing: KerraCel Ag Gelling Fiber Dressing, 0.75x12 Ribbon (silver alginate) (Home Health) 3 x Per Week/30 Days ary Discharge Instructions: Make sure to lightly pack into tunnel Secondary Dressing:  Zetuvit Plus Silicone Border Dressing 4x4 (in/in) (Home Health) 3 x Per Week/30 Days Discharge Instructions: Apply silicone border or abd pad with tape over primary dressing as directed. 02/20/2022: Per the images provided by her home health nurse, the back wound is is smaller. The orifice to the buttock wound has narrowed but the tunnel is about the same length. Still with some whitish drainage. We will continue the silver alginate to the back wound. They had been using plain packing strips in the buttock wound but I am going to change this to iodoform packing. We will have another check in in 1 month's time. Sarah, her home health nurse, will contact us with any concerns in the interim. Electronic Signature(s) Signed: 02/20/2022 12:26:44 PM By: Fredirick Maudlin MD FACS Entered By: Fredirick Maudlin on 02/20/2022 12:26:44 -------------------------------------------------------------------------------- HxROS  Details Patient Name: Date of Service: [name] 02/20/2022 11:15 A M Medical Record Number: [id number] Patient Account Number: [id number] Date of Birth/Sex: Treating RN: [birth year] (81 y.o. [name] Primary Care Provider: Nolene Ebbs Other Clinician: Referring Provider: Treating Provider/Extender: Consuelo Pandy in Treatment: 90 Unable to Obtain Patient History due to Dementia Information Obtained From Patient Eyes Medical History: Negative for: Cataracts; Glaucoma; Optic Neuritis Ear/Nose/Mouth/Throat Medical History: Negative for: Chronic sinus problems/congestion; Middle ear problems Hematologic/Lymphatic Medical History: Negative for: Anemia; Hemophilia; Human Immunodeficiency Virus; Lymphedema; Sickle Cell Disease Respiratory Medical History: Negative for: Aspiration; Asthma; Chronic Obstructive Pulmonary Disease (COPD); Pneumothorax; Sleep Apnea; Tuberculosis Cardiovascular Medical History: Positive for: Hypertension Negative for: Angina; Arrhythmia; Congestive Heart Failure; Coronary Artery Disease; Deep Vein Thrombosis; Hypotension; Myocardial Infarction; Peripheral Arterial Disease; Peripheral Venous Disease; Phlebitis; Vasculitis Gastrointestinal Medical History: Negative for: Cirrhosis ; Colitis; Crohns; Hepatitis A; Hepatitis B; Hepatitis C Endocrine Medical History: Negative for: Type I Diabetes; Type II Diabetes Genitourinary Medical History: Negative for: End Stage Renal Disease Immunological Medical History: Negative for: Lupus Erythematosus; Raynauds; Scleroderma Integumentary (Skin) Medical History: Negative for: History of Burn Musculoskeletal Medical History: Negative for: Gout; Rheumatoid Arthritis; Osteoarthritis; Osteomyelitis Neurologic Medical History: Positive for: Dementia Negative for: Neuropathy; Quadriplegia; Paraplegia; Seizure Disorder Oncologic Medical History: Negative for:  Received Chemotherapy; Received Radiation Psychiatric Medical History: Negative for: Anorexia/bulimia; Confinement Anxiety Immunizations Pneumococcal Vaccine: Received Pneumococcal Vaccination: No Implantable Devices None Hospitalization / Surgery History Type of Hospitalization/Surgery OR debridement/ metal plate removal [id number] Family and Social History Cancer: Yes - Siblings; Diabetes: No; Heart Disease: No; Hereditary Spherocytosis: No; Hypertension: No; Kidney Disease: No; Lung Disease: Yes - Siblings; Seizures: No; Stroke: No; Thyroid Problems: No; Tuberculosis: No; Former smoker; Marital Status - Widowed; Alcohol Use: Never; Drug Use: No History; Caffeine Use: Daily; Financial Concerns: No; Food, Clothing or Shelter Needs: No; Support System Lacking: No; Transportation Concerns: No Engineer, maintenance) Signed: 02/20/2022 12:27:51 PM By: Fredirick Maudlin MD FACS Signed: 02/20/2022 6:19:16 PM By: Baruch Gouty RN, BSN Entered By: Fredirick Maudlin on 02/20/2022 12:16:49 -------------------------------------------------------------------------------- [place] Details Patient Name: Date of Service: [name] 02/20/2022 Medical Record Number: [id number] Patient Account Number: [id number] Date of Birth/Sex: Treating RN: March 18, 1941 (81 y.o. [name] Primary Care Provider: Nolene Ebbs Other Clinician: Referring Provider: Treating Provider/Extender: Consuelo Pandy in Treatment: 66 Diagnosis Coding ICD-10 Codes Code Description L89.103 Pressure ulcer of unspecified part of back, stage 3 L89.323 Pressure ulcer of left buttock, stage 3 T81.31XA Disruption of external operation (surgical) wound, not elsewhere classified, initial encounter Facility Procedures CPT4 Code: [id number] Description: Telehealth originating site facility fee. 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

## 2022-02-20 NOTE — Progress Notes (Signed)
Wilk, SARIN COMUNALE ([id number]) Visit Report for 02/20/2022 Allergy List Details Patient Name: Date of Service: [name], [name] 02/20/2022 11:15 A M Medical Record Number: [id number] Patient Account Number: [id number] Date of Birth/Sex: Treating RN: [birth year] (81 y.o. [name] Primary Care Dalayna Lauter: [name] Other Clinician: Referring Wonda Goodgame: Treating Latashia Koch/Extender: Bud Face in Treatment: 09 Allergies Active Allergies codeine alteplase Allergy Notes Electronic Signature(s) Signed: 02/20/2022 12:16:44 PM By: Duanne Guess MD FACS Entered By: Duanne Guess on 02/20/2022 12:16:44 -------------------------------------------------------------------------------- Patient/Caregiver Education Details Patient Name: Date of Service: [name] 7/12/2023andnbsp11:15 A M Medical Record Number: [id number] Patient Account Number: [id number] Date of Birth/Gender: Treating RN: [birth year] (81 y.o. [name] Primary Care Physician: [name] Other Clinician: Referring Physician: Treating Physician/Extender: Bud Face in Treatment: 97 Education Assessment Education Provided To: Patient Education Topics Provided Nutrition: Methods: Explain/Verbal Responses: Reinforcements needed, State content correctly Wound/Skin Impairment: Methods: Explain/Verbal Responses: Reinforcements needed, State content correctly Electronic Signature(s) Signed: 02/20/2022 6:19:16 PM By: Zenaida Deed RN, BSN Entered By: Zenaida Deed on 02/20/2022 12:04:06 -------------------------------------------------------------------------------- Wound Assessment Details Patient Name: Date of Service: Sarah Hose D. 02/20/2022 11:15 A M Medical Record Number: [id number] Patient Account Number: [id number] Date of Birth/Sex: Treating RN: 03-05-1941 (81 y.o. [name] Primary Care Bibiana Gillean: [name] Other Clinician: Referring Corrin Sieling: Treating Ikechukwu Cerny/Extender: Bud Face in Treatment: 66 Wound Status Wound Number: 5 Primary Etiology: Pressure Ulcer Wound Location: Left Gluteus Wound Status: Open Wounding Event: Gradually Appeared Comorbid History: Hypertension, Dementia Date Acquired: 09/12/2020 Weeks Of Treatment: 66 Clustered Wound: No Photos Wound Measurements Length: (cm) 0.1 % Reduct Width: (cm) 0.2 % Reduct Depth: (cm) 0.2 Epitheli Area: (cm) 0.016 Tunneli Volume: (cm) 0.003 Undermi Start Endin Maxim ion in Area: 99.3% ion in Volume: 99.9% alization: Small (1-33%) ng: No ning: Yes ing Position (o'clock): 11 g Position (o'clock): 1 um Distance: (cm) 3 Wound Description Classification: Category/Stage III Foul Od Wound Margin: Well defined, not attached Slough/ Exudate Amount: Medium Exudate Type: Serosanguineous Exudate Color: red, brown or After Cleansing: No Fibrino No Wound Bed Granulation Amount: Large (67-100%) Exposed Structure Granulation Quality: Red Fascia Exposed: No Necrotic Amount: None Present (0%) Fat Layer (Subcutaneous Tissue) Exposed: Yes Tendon Exposed: No Muscle Exposed: No Joint Exposed: No Bone Exposed: No Electronic Signature(s) Signed: 02/20/2022 6:19:16 PM By: Zenaida Deed RN, BSN Entered By: Zenaida Deed on 02/20/2022 17:45:11 -------------------------------------------------------------------------------- Wound Assessment Details Patient Name: Date of Service: Sarah Hose D. 02/20/2022 11:15 A M Medical Record Number: [id number] Patient Account Number: [id number] Date of Birth/Sex: Treating RN: [birth year] (81 y.o. [name] Primary Care Jalin Alicea: [name] Other Clinician: Referring Gregory Dowe: Treating Lace Chenevert/Extender: Bud Face in Treatment: 66 Wound Status Wound Number: 6 Primary Etiology: Pressure Ulcer Wound Location:  Midline Back Wound Status: Open Wounding Event: Surgical Injury Comorbid History: Hypertension, Dementia Date Acquired: 09/12/2020 Weeks Of Treatment: 66 Clustered Wound: No Photos Wound Measurements Length: (cm) 0.2 Width: (cm) 0.2 Depth: (cm) 0.1 Area: (cm) 0.031 Volume: (cm) 0.003 % Reduction in Area: 99.8% % Reduction in Volume: 100% Epithelialization: Large (67-100%) Tunneling: No Undermining: No Wound Description Classification: Category/Stage III Wound Margin: Flat and Intact Exudate Amount: None Present Foul Odor After Cleansing: No Slough/Fibrino No Wound Bed Granulation Amount: Large (67-100%) Exposed Structure Granulation Quality: Pink Fascia Exposed: No Necrotic Amount: None Present (0%) Fat Layer (Subcutaneous Tissue) Exposed: Yes Tendon Exposed: No Muscle Exposed: No Joint Exposed: No Bone Exposed: No Electronic Signature(s) Signed: 02/20/2022  6:19:16 PM By: Zenaida Deed RN, BSN Entered By: Zenaida Deed on 02/20/2022 17:45:49 -------------------------------------------------------------------------------- Vitals Details Patient Name: Date of Service: Sarah Hose D. 02/20/2022 11:15 A M Medical Record Number: [id number] Patient Account Number: [id number] Date of Birth/Sex: Treating RN: [birth year] (81 y.o. [name] Primary Care Aamari Strawderman: [name] Other Clinician: Referring Kamaiyah Uselton: Treating Paityn Balsam/Extender: Bud Face in Treatment: 50 Vital Signs Time Taken: 11:30 Temperature (F): 98.2 Height (in): 62 Pulse (bpm): 60 Respiratory Rate (breaths/min): 16 Blood Pressure (mmHg): 134/68 Capillary Blood Glucose (mg/dl): 98 Reference Range: 80 - 120 mg / dl Notes vitals per [facility] RN Electronic Signature(s) Signed: 02/20/2022 6:19:16 PM By: Zenaida Deed RN, BSN Entered By: Zenaida Deed on 02/20/2022 11:40:05

## 2022-03-20 ENCOUNTER — Encounter (HOSPITAL_BASED_OUTPATIENT_CLINIC_OR_DEPARTMENT_OTHER): Payer: Medicare Other | Attending: General Surgery | Admitting: General Surgery

## 2022-03-20 DIAGNOSIS — Y838 Other surgical procedures as the cause of abnormal reaction of the patient, or of later complication, without mention of misadventure at the time of the procedure: Secondary | ICD-10-CM | POA: Diagnosis not present

## 2022-03-20 DIAGNOSIS — T8131XA Disruption of external operation (surgical) wound, not elsewhere classified, initial encounter: Secondary | ICD-10-CM | POA: Diagnosis not present

## 2022-03-20 DIAGNOSIS — F0390 Unspecified dementia without behavioral disturbance: Secondary | ICD-10-CM | POA: Diagnosis not present

## 2022-03-20 DIAGNOSIS — I428 Other cardiomyopathies: Secondary | ICD-10-CM | POA: Diagnosis not present

## 2022-03-20 DIAGNOSIS — Z86718 Personal history of other venous thrombosis and embolism: Secondary | ICD-10-CM | POA: Insufficient documentation

## 2022-03-20 DIAGNOSIS — L89323 Pressure ulcer of left buttock, stage 3: Secondary | ICD-10-CM | POA: Insufficient documentation

## 2022-03-20 DIAGNOSIS — I6930 Unspecified sequelae of cerebral infarction: Secondary | ICD-10-CM | POA: Insufficient documentation

## 2022-03-20 DIAGNOSIS — Z86711 Personal history of pulmonary embolism: Secondary | ICD-10-CM | POA: Insufficient documentation

## 2022-03-20 NOTE — Progress Notes (Signed)
[name], Sarah WILDES ([id number]) Visit Report for 03/20/2022 Arrival Information Details Patient Name: Date of Service: [name], [name] 03/20/2022 10:30 A M Medical Record Number: [id number] Patient Account Number: [id number] Date of Birth/Sex: Treating RN: November 12, 1940 (81 y.o. [name] Primary Care Natalynn Pedone: Nolene Ebbs Other Clinician: Referring Meliyah Simon: Treating Yazmeen Woolf/Extender: Consuelo Pandy in Treatment: 104 Visit Information History Since Last Visit Added or deleted any medications: No Patient Arrived: Other Any new allergies or adverse reactions: No Arrival Time: 10:49 Had a fall or experienced change in No Transfer Assistance: None activities of daily living that may affect Patient Identification Verified: Yes risk of falls: Secondary Verification Process Completed: Yes Signs or symptoms of abuse/neglect since last visito No Patient Requires Transmission-Based Precautions: No Hospitalized since last visit: No Patient Has Alerts: No Implantable device outside of the clinic excluding No cellular tissue based products placed in the center since last visit: Has Dressing in Place as Prescribed: Yes Pain Present Now: No Notes televisit Electronic Signature(s) Signed: 03/20/2022 6:15:39 PM By: Baruch Gouty RN, BSN Entered By: Baruch Gouty on 03/20/2022 10:49:40 -------------------------------------------------------------------------------- Multi Wound Chart Details Patient Name: Date of Service: Sarah Seltzer D. 03/20/2022 10:30 A M Medical Record Number: [id number] Patient Account Number: [id number] Date of Birth/Sex: Treating RN: 07-Jan-1941 (81 y.o. [name] Primary Care Jerin Franzel: Nolene Ebbs Other Clinician: Referring Monique Hefty: Treating Green Quincy/Extender: Consuelo Pandy in Treatment: 53 Vital Signs Height(in): 62 Pulse(bpm): 61 Weight(lbs): Blood Pressure(mmHg): 132/74 Body Mass  Index(BMI): Temperature(F): 98.3 Respiratory Rate(breaths/min): Photos: [5:No Photos Left Gluteus] [6:No Photos Midline Back] [N/A:N/A N/A] Wound Location: [5:Gradually Appeared] [6:Surgical Injury] [N/A:N/A] Wounding Event: [5:Pressure Ulcer] [6:Pressure Ulcer] [N/A:N/A] Primary Etiology: [5:Hypertension, Dementia] [6:Hypertension, Dementia] [N/A:N/A] Comorbid History: [5:09/12/2020] [6:09/12/2020] [N/A:N/A] Date Acquired: [5:70] [6:70] [N/A:N/A] Weeks of Treatment: [5:Open] [6:Open] [N/A:N/A] Wound Status: [5:No] [6:No] [N/A:N/A] Wound Recurrence: [5:0.1x0.2x0.2] [6:0.2x0.1x0.1] [N/A:N/A] Measurements L x W x D (cm) [5:0.016] [6:0.016] [N/A:N/A] A (cm) : rea [5:0.003] [6:0.002] [N/A:N/A] Volume (cm) : [5:99.30%] [6:99.90%] [N/A:N/A] % Reduction in A rea: [5:99.90%] [6:100.00%] [N/A:N/A] % Reduction in Volume: [5:11] Starting Position 1 (o'clock): [5:1] Ending Position 1 (o'clock): [5:3] Maximum Distance 1 (cm): [5:Yes] [6:No] [N/A:N/A] Undermining: [5:Category/Stage III] [6:Category/Stage III] [N/A:N/A] Classification: [5:Medium] [6:None Present] [N/A:N/A] Exudate A mount: [5:Sanguinous] [6:N/A] [N/A:N/A] Exudate Type: [5:red] [6:N/A] [N/A:N/A] Exudate Color: [5:Well defined, not attached] [6:Flat and Intact] [N/A:N/A] Wound Margin: [5:Large (67-100%)] [6:Large (67-100%)] [N/A:N/A] Granulation A mount: [5:Red] [6:Red] [N/A:N/A] Granulation Quality: [5:None Present (0%)] [6:None Present (0%)] [N/A:N/A] Necrotic A mount: [5:Fat Layer (Subcutaneous Tissue): Yes Fat Layer (Subcutaneous Tissue): Yes N/A] Exposed Structures: [5:Fascia: No Tendon: No Muscle: No Joint: No Bone: No Small (1-33%)] [6:Fascia: No Tendon: No Muscle: No Joint: No Bone: No Large (67-100%)] [N/A:N/A] Treatment Notes Electronic Signature(s) Signed: 03/20/2022 11:27:57 AM By: Fredirick Maudlin MD FACS Signed: 03/20/2022 6:15:39 PM By: Baruch Gouty RN, BSN Entered By: Fredirick Maudlin on 03/20/2022  11:27:57 -------------------------------------------------------------------------------- Multi-Disciplinary Care Plan Details Patient Name: Date of Service: Sarah Seltzer D. 03/20/2022 10:30 A M Medical Record Number: [id number] Patient Account Number: [id number] Date of Birth/Sex: Treating RN: 1940/08/17 (81 y.o. [name] Primary Care Holdan Stucke: Nolene Ebbs Other Clinician: Referring Farra Nikolic: Treating Kevonta Phariss/Extender: Consuelo Pandy in Treatment: 11 Active Inactive Pressure Nursing Diagnoses: Knowledge deficit related to causes and risk factors for pressure ulcer development Goals: Patient will remain free from development of additional pressure ulcers Date Initiated: 11/10/2020 Target Resolution Date: 05/11/2021 Goal Status: Active Interventions: Assess: immobility, friction, shearing, incontinence upon admission and  as needed Assess offloading mechanisms upon admission and as needed Assess potential for pressure ulcer upon admission and as needed Provide education on pressure ulcers Treatment Activities: Patient referred for home evaluation of offloading devices/mattresses : 11/10/2020 Notes: 12/22/20 Pressure relief ongoing issue. 01/26/21: No new wounds, pressure relief ongoing. Wound/Skin Impairment Nursing Diagnoses: Impaired tissue integrity Goals: Patient/caregiver will verbalize understanding of skin care regimen Date Initiated: 11/10/2020 Target Resolution Date: 05/11/2021 Goal Status: Active Ulcer/skin breakdown will have a volume reduction of 30% by week 4 Date Initiated: 11/10/2020 Date Inactivated: 12/22/2020 Target Resolution Date: 12/08/2020 Goal Status: Met Ulcer/skin breakdown will have a volume reduction of 50% by week 8 Date Initiated: 12/22/2020 Date Inactivated: 01/26/2021 Target Resolution Date: 01/12/2021 Goal Status: Met Ulcer/skin breakdown will have a volume reduction of 80% by week 12 Date Initiated: 01/26/2021 Date  Inactivated: 03/01/2021 Target Resolution Date: 02/23/2021 Unmet Reason: see wound Goal Status: Unmet measurements. Interventions: Assess patient/caregiver ability to obtain necessary supplies Assess patient/caregiver ability to perform ulcer/skin care regimen upon admission and as needed Assess ulceration(s) every visit Provide education on ulcer and skin care Treatment Activities: Skin care regimen initiated : 11/10/2020 Topical wound management initiated : 11/10/2020 Notes: 01/26/21: Wounds greater than 50% volume reduction, new goal initiated. Electronic Signature(s) Signed: 03/20/2022 6:15:39 PM By: Baruch Gouty RN, BSN Entered By: Baruch Gouty on 03/20/2022 10:58:23 -------------------------------------------------------------------------------- Pain Assessment Details Patient Name: Date of Service: Sarah Seltzer D. 03/20/2022 10:30 A M Medical Record Number: [id number] Patient Account Number: [id number] Date of Birth/Sex: Treating RN: October 29, 1940 (81 y.o. [name] Primary Care Shawn Dannenberg: Nolene Ebbs Other Clinician: Referring [place] Gamero: Treating Sahra Converse/Extender: Consuelo Pandy in Treatment: 66 Active Problems Location of Pain Severity and Description of Pain Patient Has Paino No Site Locations Rate the pain. Current Pain Level: 0 Pain Management and Medication Current Pain Management: Electronic Signature(s) Signed: 03/20/2022 6:15:39 PM By: Baruch Gouty RN, BSN Entered By: Baruch Gouty on 03/20/2022 10:48:59 -------------------------------------------------------------------------------- Patient/Caregiver Education Details Patient Name: Date of Service: [name] 8/9/2023andnbsp10:30 A M Medical Record Number: [id number] Patient Account Number: [id number] Date of Birth/Gender: Treating RN: 1941-06-01 (81 y.o. [name] Primary Care Physician: Nolene Ebbs Other Clinician: Referring Physician: Treating  Physician/Extender: Consuelo Pandy in Treatment: 45 Education Assessment Education Provided To: Patient Education Topics Provided Pressure: Methods: Explain/Verbal Responses: Reinforcements needed, State content correctly Wound/Skin Impairment: Methods: Explain/Verbal Responses: Reinforcements needed, State content correctly Electronic Signature(s) Signed: 03/20/2022 6:15:39 PM By: Baruch Gouty RN, BSN Entered By: Baruch Gouty on 03/20/2022 11:00:25 -------------------------------------------------------------------------------- Wound Assessment Details Patient Name: Date of Service: Sarah Seltzer D. 03/20/2022 10:30 A M Medical Record Number: [id number] Patient Account Number: [id number] Date of Birth/Sex: Treating RN: [birth year] (81 y.o. [name] Primary Care Sherylann Vangorden: Nolene Ebbs Other Clinician: Referring Waleed Dettman: Treating Chantz Montefusco/Extender: Consuelo Pandy in Treatment: 70 Wound Status Wound Number: 5 Primary Etiology: Pressure Ulcer Wound Location: Left Gluteus Wound Status: Open Wounding Event: Gradually Appeared Comorbid History: Hypertension, Dementia Date Acquired: 09/12/2020 Weeks Of Treatment: 70 Clustered Wound: No Wound Measurements Length: (cm) 0.1 Width: (cm) 0.2 Depth: (cm) 0.2 Area: (cm) 0.016 Volume: (cm) 0.003 % Reduction in Area: 99.3% % Reduction in Volume: 99.9% Epithelialization: Small (1-33%) Tunneling: No Undermining: Yes Starting Position (o'clock): 11 Ending Position (o'clock): 1 Maximum Distance: (cm) 3 Wound Description Classification: Category/Stage III Wound Margin: Well defined, not attached Exudate Amount: Medium Exudate Type: Sanguinous Exudate Color: red Foul Odor After Cleansing: No Slough/Fibrino No Wound Bed Granulation Amount: Large (  67-100%) Exposed Structure Granulation Quality: Red Fascia Exposed: No Necrotic Amount: None Present (0%) Fat Layer  (Subcutaneous Tissue) Exposed: Yes Tendon Exposed: No Muscle Exposed: No Joint Exposed: No Bone Exposed: No Electronic Signature(s) Signed: 03/20/2022 6:15:39 PM By: Baruch Gouty RN, BSN Entered By: Baruch Gouty on 03/20/2022 10:48:18 -------------------------------------------------------------------------------- Wound Assessment Details Patient Name: Date of Service: Sarah Seltzer D. 03/20/2022 10:30 A M Medical Record Number: [id number] Patient Account Number: [id number] Date of Birth/Sex: Treating RN: 28-Nov-1940 (81 y.o. [name] Primary Care Ismaeel Arvelo: Nolene Ebbs Other Clinician: Referring Edmar Blankenburg: Treating Renaye Janicki/Extender: Consuelo Pandy in Treatment: 70 Wound Status Wound Number: 6 Primary Etiology: Pressure Ulcer Wound Location: Midline Back Wound Status: Open Wounding Event: Surgical Injury Comorbid History: Hypertension, Dementia Date Acquired: 09/12/2020 Weeks Of Treatment: 70 Clustered Wound: No Wound Measurements Length: (cm) 0.2 Width: (cm) 0.1 Depth: (cm) 0.1 Area: (cm) 0.016 Volume: (cm) 0.002 % Reduction in Area: 99.9% % Reduction in Volume: 100% Epithelialization: Large (67-100%) Tunneling: No Undermining: No Wound Description Classification: Category/Stage III Wound Margin: Flat and Intact Exudate Amount: None Present Foul Odor After Cleansing: No Slough/Fibrino No Wound Bed Granulation Amount: Large (67-100%) Exposed Structure Granulation Quality: Red Fascia Exposed: No Necrotic Amount: None Present (0%) Fat Layer (Subcutaneous Tissue) Exposed: Yes Tendon Exposed: No Muscle Exposed: No Joint Exposed: No Bone Exposed: No Electronic Signature(s) Signed: 03/20/2022 6:15:39 PM By: Baruch Gouty RN, BSN Entered By: Baruch Gouty on 03/20/2022 10:48:48 -------------------------------------------------------------------------------- Vitals Details Patient Name: Date of Service: Sarah Seltzer  D. 03/20/2022 10:30 A M Medical Record Number: [id number] Patient Account Number: [id number] Date of Birth/Sex: Treating RN: 04-16-1941 (81 y.o. [name], [name] Primary Care Christalynn Boise: Nolene Ebbs Other Clinician: Referring Gordy Goar: Treating Keric Zehren/Extender: Consuelo Pandy in Treatment: 28 Vital Signs Time Taken: 10:49 Temperature (F): 98.3 Height (in): 62 Pulse (bpm): 61 Blood Pressure (mmHg): 132/74 Reference Range: 80 - 120 mg / dl Airway Pulse Oximetry (%): 95 Notes per [facility] RN Electronic Signature(s) Signed: 03/20/2022 6:15:39 PM By: Baruch Gouty RN, BSN Entered By: Baruch Gouty on 03/20/2022 11:05:48

## 2022-03-20 NOTE — Progress Notes (Signed)
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 History: Negative for: Type I Diabetes; Type II Diabetes Genitourinary Medical History: Negative for:  End Stage Renal Disease Immunological Medical History: Negative for: Lupus Erythematosus; Raynauds; Scleroderma Integumentary (Skin) Medical History: Negative for: History of Burn Musculoskeletal Medical History: Negative for: Gout; Rheumatoid Arthritis; Osteoarthritis; Osteomyelitis Neurologic Medical History: Positive for: Dementia Negative for: Neuropathy; Quadriplegia; Paraplegia; Seizure Disorder Oncologic Medical History: Negative for: Received Chemotherapy; Received Radiation Psychiatric Medical History: Negative for: Anorexia/bulimia; Confinement Anxiety Immunizations Pneumococcal Vaccine: Received Pneumococcal Vaccination: No Implantable Devices None Hospitalization / Surgery History Type of Hospitalization/Surgery OR debridement/ metal plate removal [id number] Family and Social History Cancer: Yes - Siblings; Diabetes: No; Heart Disease: No; Hereditary Spherocytosis: No; Hypertension: No; Kidney Disease: No; Lung Disease: Yes - Siblings; Seizures: No; Stroke: No; Thyroid Problems: No; Tuberculosis: No; Former smoker; Marital Status - Widowed; Alcohol Use: Never; Drug Use: No History; Caffeine Use: Daily; Financial Concerns: No; Food, Clothing or Shelter Needs: No; Support System Lacking: No; Transportation Concerns: No Engineer, maintenance) Signed: 03/20/2022 12:21:54 PM By: Fredirick Maudlin MD FACS Signed: 03/20/2022 6:15:39 PM By: Baruch Gouty RN, BSN Entered By: Fredirick Maudlin on 03/20/2022 11:29:34 -------------------------------------------------------------------------------- [place] Details Patient Name: Date of Service: [name] 03/20/2022 Medical Record Number: [id number] Patient Account Number: [id number] Date of Birth/Sex: Treating RN: 02/09/1941 (81 y.o. [name] Primary Care Provider: Nolene Ebbs Other Clinician: Referring Provider: Treating Provider/Extender: Consuelo Pandy in Treatment: 13 Diagnosis  Coding ICD-10 Codes Code Description L89.103 Pressure ulcer of unspecified part of back, stage 3 L89.323 Pressure ulcer of left buttock, stage 3 T81.31XA Disruption of external operation (surgical) wound, not elsewhere classified, initial encounter Facility Procedures CPT4 Code: [id number] Description: [facility] originating site facility fee. Modifier: Quantity: 1 Physician Procedures : CPT4 Code Description Modifier [id number] - WC PHYS LEVEL 3 - EST PT ICD-10 Diagnosis Description L89.103 Pressure ulcer of unspecified part of back, stage 3 L89.323 Pressure ulcer of left buttock, stage 3 T81.31XA Disruption of external operation  (surgical) wound, not elsewhere classified, initial encounter Quantity: 1 Electronic Signature(s) Signed: 03/20/2022 11:31:44 AM By: Fredirick Maudlin MD FACS Entered By: Fredirick Maudlin on 03/20/2022 11:31:44

## 2022-04-17 ENCOUNTER — Encounter (HOSPITAL_BASED_OUTPATIENT_CLINIC_OR_DEPARTMENT_OTHER): Payer: Medicare Other | Attending: General Surgery | Admitting: General Surgery

## 2022-04-17 DIAGNOSIS — L89323 Pressure ulcer of left buttock, stage 3: Secondary | ICD-10-CM | POA: Diagnosis not present

## 2022-04-17 NOTE — Progress Notes (Signed)
[place], IKRAM RIEBE ([id number]) Visit Report for 04/17/2022 Allergy List Details Patient Name: Date of Service: [name], [name] 04/17/2022 10:30 A M Medical Record Number: [id number] Patient Account Number: [id number] Date of Birth/Sex: Treating RN: September 08, 1940 (81 y.o. [name] Primary Care Demetreus Lothamer: [name] Other Clinician: Referring Cozette Braggs: Treating Geniyah Eischeid/Extender: Bud Face in Treatment: 78 Allergies Active Allergies codeine alteplase Allergy Notes Electronic Signature(s) Signed: 04/17/2022 11:06:02 AM By: Duanne Guess MD FACS Entered By: Duanne Guess on 04/17/2022 11:06:02 -------------------------------------------------------------------------------- Clinic Level of Care Assessment Details Patient Name: Date of Service: [name], [name] 04/17/2022 10:30 A M Medical Record Number: [id number] Patient Account Number: [id number] Date of Birth/Sex: Treating RN: Aug 17, 1940 (81 y.o. [name] Primary Care Cordarrell Sane: [name] Other Clinician: Referring Tian Davison: Treating Lennox Leikam/Extender: Bud Face in Treatment: 29 Clinic Level of Care Assessment Items TOOL 1 Quantity Score []  - 0 Use when EandM and Procedure is performed on INITIAL visit ASSESSMENTS - Nursing Assessment / Reassessment []  - 0 General Physical Exam (combine w/ comprehensive assessment (listed just below) when performed on new pt. evals) []  - 0 Comprehensive Assessment (HX, ROS, Risk Assessments, Wounds Hx, etc.) ASSESSMENTS - Wound and Skin Assessment / Reassessment []  - 0 Dermatologic / Skin Assessment (not related to wound area) ASSESSMENTS - Ostomy and/or Continence Assessment and Care []  - 0 Incontinence Assessment and Management []  - 0 Ostomy Care Assessment and Management (repouching, etc.) PROCESS - Coordination of Care []  - 0 Simple Patient / Family Education for ongoing care []  - 0 Complex  (extensive) Patient / Family Education for ongoing care []  - 0 Staff obtains , Records, T Results / Process Orders est []  - 0 Staff telephones HHA, Nursing Homes / Clarify orders / etc []  - 0 Routine Transfer to another Facility (non-emergent condition) []  - 0 Routine Hospital Admission (non-emergent condition) []  - 0 New Admissions / / Ordering NPWT Apligraf, etc. , []  - 0 Emergency Hospital Admission (emergent condition) PROCESS - Special Needs []  - 0 Pediatric / Minor Patient Management []  - 0 Isolation Patient Management []  - 0 Hearing / Language / Visual special needs []  - 0 Assessment of Community assistance (transportation, D/C planning, etc.) []  - 0 Additional assistance / Altered mentation []  - 0 Support Surface(s) Assessment (bed, cushion, seat, etc.) INTERVENTIONS - Miscellaneous []  - 0 External ear exam []  - 0 Patient Transfer (multiple staff / / Similar devices) []  - 0 Simple Staple / Suture removal (25 or less) []  - 0 Complex Staple / Suture removal (26 or more) []  - 0 Hypo/Hyperglycemic Management (do not check if billed separately) []  - 0 Ankle / Brachial Index (ABI) - do not check if billed separately Has the patient been seen at the hospital within the last three years: Yes Total Score: 0 Level Of Care: ____ Electronic Signature(s) Signed: 04/17/2022 11:31:48 AM By: MD FACS Entered By: on 04/17/2022 11:06:30 -------------------------------------------------------------------------------- Patient/Caregiver Education Details Patient Name: Date of Service: 9/6/2023andnbsp10:30 A M Medical Record Number: Chiropractor Patient Account Number: Date of Birth/Gender: Treating RN: Mar 20, 1941 (81 y.o. Primary Care Physician: Manufacturing engineer Other Clinician: Referring Physician: Treating Physician/Extender: in Treatment: 22 Education Assessment Education Provided To: Caregiver HH RN and son Education Topics Provided Wound/Skin Impairment: Methods: Explain/Verbal Responses: Reinforcements needed, State content correctly ) Signed: 04/17/2022 5:54:13 PM By: RN, BSN Entered By: ,  Linda on 04/17/2022 17:03:26 -------------------------------------------------------------------------------- Wound Assessment Details Patient Name: Date of Service: [name], [name] 04/17/2022 10:30 A M Medical Record Number: [id number] Patient Account Number: [id number] Date of Birth/Sex: Treating RN: 1941-06-13 (81 y.o. [name] Primary Care Pesach Frisch: [name] Other Clinician: Referring Burlie Cajamarca: Treating Elai Vanwyk/Extender: Bud Face in Treatment: 63 Wound Status Wound Number: 5 Primary Etiology: Pressure Ulcer Wound Location: Left Gluteus Wound Status: Open Wounding Event: Gradually Appeared Comorbid History: Hypertension, Dementia Date Acquired: 09/12/2020 Weeks Of Treatment: 74 Clustered Wound: No Wound Measurements Length: (cm) 0.1 Width: (cm) 0.2 Depth: (cm) 0.2 Area: (cm) 0.016 Volume: (cm) 0.003 % Reduction in Area: 99.3% % Reduction in Volume: 99.9% Epithelialization: Small (1-33%) Tunneling: Yes Position (o'clock): 12 Maximum Distance: (cm) 2.3 Undermining: No Wound Description Classification: Category/Stage III Wound Margin: Well defined, not attached Exudate Amount: Medium Exudate Type: Sanguinous Exudate Color: red Foul Odor After Cleansing: No Slough/Fibrino No Wound Bed Granulation Amount: Large (67-100%) Exposed Structure Granulation Quality: Red Fascia Exposed: No Necrotic Amount: None Present (0%) Fat Layer (Subcutaneous Tissue) Exposed: Yes Tendon Exposed: No Muscle Exposed: No Joint Exposed: No Bone Exposed: No Electronic Signature(s) Signed: 04/17/2022 5:54:13  PM By: Zenaida Deed RN, BSN Entered By: Zenaida Deed on 04/17/2022 10:49:41 -------------------------------------------------------------------------------- Wound Assessment Details Patient Name: Date of Service: Sarah Hose D. 04/17/2022 10:30 A M Medical Record Number: [id number] Patient Account Number: [id number] Date of Birth/Sex: Treating RN: [birth year] (81 y.o. [name] Primary Care Lolamae Voisin: [name] Other Clinician: Referring Obie Silos: Treating Jailey Booton/Extender: Bud Face in Treatment: 17 Wound Status Wound Number: 6 Primary Etiology: Pressure Ulcer Wound Location: Midline Back Wound Status: Healed - Epithelialized Wounding Event: Surgical Injury Comorbid History: Hypertension, Dementia Date Acquired: 09/12/2020 Weeks Of Treatment: 74 Clustered Wound: No Wound Measurements Length: (cm) Width: (cm) Depth: (cm) Area: (cm) Volume: (cm) 0 % Reduction in Area: 100% 0 % Reduction in Volume: 100% 0 Epithelialization: Large (67-100%) 0 Tunneling: No 0 Undermining: No Wound Description Classification: Category/Stage III Exudate Amount: None Present Wound Bed Granulation Amount: None Present (0%) Exposed Structure Necrotic Amount: None Present (0%) Fascia Exposed: No Fat Layer (Subcutaneous Tissue) Exposed: No Tendon Exposed: No Muscle Exposed: No Joint Exposed: No Bone Exposed: No Electronic Signature(s) Signed: 04/17/2022 5:54:13 PM By: Zenaida Deed RN, BSN Entered By: Zenaida Deed on 04/17/2022 10:49:00 -------------------------------------------------------------------------------- Vitals Details Patient Name: Date of Service: Sarah Hose D. 04/17/2022 10:30 A M Medical Record Number: [id number] Patient Account Number: [id number] Date of Birth/Sex: Treating RN: [birth year] (81 y.o. [name] Primary Care Rionna Feltes: [name] Other Clinician: Referring Hilbert Briggs: Treating  Ridhaan Dreibelbis/Extender: Bud Face in Treatment: 64 Vital Signs Time Taken: 10:51 Temperature (F): 97.5 Height (in): 62 Pulse (bpm): 58 Blood Pressure (mmHg): 122/64 Reference Range: 80 - 120 mg / dl Airway Pulse Oximetry (%): 94 Notes per [facility] RN at pt's bedside Electronic Signature(s) Signed: 04/17/2022 5:54:13 PM By: Zenaida Deed RN, BSN Entered By: Zenaida Deed on 04/17/2022 10:51:56

## 2022-04-18 NOTE — Progress Notes (Signed)
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 referred her to the emergency room. On 08/11/2020 she underwent lumbar hardware removal which had been previously placed for spinal fusion. Wound culture at that time showed E. coli and strep I think she received 6 weeks of Ancef. She went to a skilled facility. In the skilled facility there was some suggestion that the surgical area on her back almost closed over but then reopened. She also developed areas on the sacrum and 2 areas on the left buttock. Recently she was admitted to hospital from 10/19/2020 through 11/07/2020 with wound infection, hypernatremia, aspiration pneumonia. The thoracic spine wound was felt to be infected culture of this area grew Pseudomonas and Klebsiella she received IV cefepime but was discharged on p.o. Amoxil and ciprofloxacin that she is still taking. She also had a PEG tube insertion for nutrition and hydration. She is receiving promote also through this as a protein supplement. CT scan done during the  hospitalization did not suggest acute osteomyelitis of the thoracic spine. They currently are using Santyl wet-to-dry to all wound areas. 4/15; she continues with a large wound with considerable undermining in the lower thoracic spine. She has an area on the sacrum and a tunneling wound on the left buttock. None of these looks particularly infected  at this moment and there is no exposed bone in any wound. She is completing the Amoxil and ciprofloxacin We received a call from her pharmacy that I prescribed Augmentin for her and indeed looking at the records that is the case. I think this must have been an error because I did not comment on that and I can see why I would have done this. In any case she is completing her Augmentin and I do not think that will hurt. It does not look like she is being followed any but by anybody for these active dangerous infections however everything looks satisfactory at this point. I will review her last note from infectious disease and see what they wanted to do is follow-up for the thoracic spine wound which was initially infected hardware 4/29; patient presents for 2-week follow-up. Patient has multiple wounds that are located to her back, sacrum and buttocks. She has been using silver collagen with dressing changes. She states she overall feels well today with no questions or concerns. She denies any signs of infection. 5/13; 2-week follow-up. Patient has a large wound which was her surgical site in the lower thoracic spine. This is a fairly large wound with very significant undermining. She also has an area in the left buttock which also has significant undermining. Sacral wound I think looks better. She had a's small additional wound on the left buttock that is healed. We have been using silver collagen on most of this but also Iodosorb on some of the more sloughy areas 5/27; patient presents for 2-week follow-up. She has been using collagen to all of the wounds.  She reports no issues today. She denies signs of infection. She has her wound VAC today with her. She has not had this placed yet. 6/17; 2-3-week follow-up. She has the original infection wound in her back which was her original surgical site. This is in her lower thoracic spine. This is generally been doing better. Less undermining and no exposed bone Otherwise there is a mixed picture here. The sacral wound is healed however the left buttock wound has significant undermining of 8 cm from 2-5 o'clock maximum of 3 cm. This is a big deterioration. No obvious infection 6/30; 2-week follow-up. She has had the wound VAC on her surgical wound on her back bridging to the left buttock. This started last week 7/21. 3-week follow-up. Home health apparently called about 10 days ago to report they could no longer get the black foam in the tunnel of her left buttock wound. Working around this just did not seem to be in the cards so I think they are using iodoform packing strips. Still using the wound VAC on the back wound which is making decent progress. 8/17 follow-up visit. Left buttock wound apparently with purulent looking drainage. Back wound apparently slightly larger. We are using a wound VAC in this area 9/1; culture I did last time of the left buttock showed E. coli. I gave her cephalexin solution. This has been completed however our intake nurse no still noticed some purulent looking drainage from this area. Tunneling depth at 6.5 cm. Silver alginate packing strip. The area on the back which was the original wound actually looks better we are using a wound VAC in this area. 9/28; at the request of the patient's son who is her POA. The patient was seen by virtual visit. The problem here is that in transporting the patient to our clinic the patient was billed at thousand dollars by  ambulance service which apparently was not covered at all by her insurance. I did not really look into the latter but did  agree to see her virtually. We saw her today in the accompaniment of her son who was grateful and the patient's home health nurse Pamala Hurry. The patient has 2 open areas. One was a her original surgical wound on her back. We have been using a wound VAC here. The home health nurse reports a lot more pain around the wound although there is no purulent drainage. Palpation of the wound results and discomfort. The other wound is on her left buttock. This is down to 5.7 cm of depth there is no purulent drainage here we have been using gentamicin for treatment of her previous gram-negative's [E. coli] in this area as well as silver alginate packing. The wound orifice is too small to consider many other dressing 10/12; we saw the patient today with the assistance of the patient's home health nurse. She is not able to be transported to clinic therefore in the interest of ongoing patient care, collaboration with her home health colleagues we have seen her now for the second time via telehealth She has 2 open wounds 1 was her original surgical wound on the back. We stopped the wound VAC last visit because of skin irritation we have been using silver alginate with some good improvement in the wound surface area. However the drainage on the dressing really looked worrisome. I had ordered a culture last time but apparently there were difficulties with this for 1 reason or another this was not done therefore I have reordered that today. The area on the left buttock is now down to 6 5 cm use silver alginate packing removed with gentamicin 10/26; telehealth visit with the assistance of the patient's home health nurse. The patient's son was present. We do this because of the difficulty transporting the patient to our clinic and the cost of such transport. Culture we did last time of the back wound grew MSSA. I gave him 7 days of Levaquin and topical Bactroban. Home health says the wound is doing better measuring smaller  and seems to have less drainage. The remaining area on the left buttock still is 5.7 cm in depth. This has not improved in several weeks. We have been using silver alginate to both wound areas The area on her buttock is not responding. There is minimal to moderate drainage per the home health nurse but no surrounding tenderness demonstrated today 11/9; telehealth visit with the assistance of the patient's home health nurse. The patient's son was also present. We do this because of the difficulty in transporting her to our clinic and the cost of such transport. The original wound was her area on the back with infected hardware. This is really coming nicely. We have been using silver alginate. The area on the left buttock is now down to 5 cm in depth from 5.7 the last time. This is also improved the patient does not have any tenderness around her wounds. Home health seems quite happy that the wounds are contracting 12/7; monthly telehealth visit in cooperation with the patient's home health nurse. I was present with one of our wound care nurses. I do not believe her son was present today. She has a wound on her back is actually somewhat better where the area on the left buttock still has 5 cm of probing depth. This was at 1 point a stage III wound. The home care nurse  relates of bloody drainage. We have been using silver alginate 12/21; 2-week follow-up via telehealth. We have been doing this because of the patient's severe dementia and difficulty in transportation. The patient was accompanied by her home health nurse and the patient's son. Our intake nurse and myself interviewed We have been following 2 wound areas 1 on the back which was initially an infected pressure ulcer extending in the hardware. 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 This is a probing sinus  today at 3.7 cm this would be an improvement. We have been using silver alginate packing rope here as well 08/22/2021 again we had a telehealth visit with this patient, her home health nurse. Her son was also present. Our case Production designer, theatre/television/film myself and RN. We have been doing this due to the impossibility of transporting this patient to our clinic for usual reviews. She has an improving surgical wound on her back both in terms of condition and dimension. We are using gentamicin and silver alginate. On her left buttock, she has a sinus leg wound which is the remanent of an initial pressure ulcer. We were called and notified last week by home health that she now has a separate sinus tract going superiorly. Both of these are about 3 cm. We have been using silver alginate and gentamicin here as well and we were a bit hopeful last time that this was getting better however apparently there is a new sinus tract 09/19/2021; telemetry health visit with the patient her home health nurse. Myself and our case manager. This is done because of the inability to transport the patient to the clinic. We have been using gentamicin and silver alginate on the original surgical wound on her back. The left buttock wound is a remnant of an initial pressure ulcer. One of the sinus tracts in this area has closed and according to the nurse a lot less purulent drainage 10/17/2021: T elehealth visit with the patient via her home health nurse. Our case manager and I participated in the call. Due to lack of funds for transportation, she is unable to be seen in clinic. We have been continuing the gentamicin and silver alginate to the surgical wound. The left buttock wound is closing, but it does have a sinus tract that drains thick greenish-white material that the nurse states does not have a significant odor, but it is fairly copious. She was last cultured in December. Corynebacterium grew. As per Dr. Jannetta Quint previous notes, he has been hoping  to pursue a CT scan to better evaluate the area, due to concern for underlying abscess or other untreated process contributing to this ongoing drainage. The same issue with funds for transportation to the clinic also applies to transportation for CT scan. The patient's nurse reports that the patient's son has saved up about half of what would be required to transport her for the scan. They will let us know when they are able to arrange this. Images were provided by the home health nurse. I personally reviewed these. There really is not much open on the buttock except for the sinus tract. The old surgical wound on her back appears quite healthy with good granulation tissue. Compared to prior images, this wound has closed up significantly. 11/14/2021: T elehealth visit with the patient via her home health nurse. Her case manager and I participated in the call.  We had a culture performed at her last visit, per the home health nurse and this was negative for any growth. The nurse reports that the patient's son has been able to save up the money necessary to transport the patient for CT scan. She also reported that the tract on the buttock has gotten wider, but the orifice has actually narrowed. They are having difficulty removing all of the silver alginate from the wound without it shearing off. The drainage from the site is copious and dark green-brown. The nurse provided images of the buttock wound, as well as the wound on her back. The back wound continues to contract and appears to have a relatively healthy surface. 12/19/2021: T elehealth visit with the patient via her home health nurse. The case manager and I participated in the call. The CT scan that we had requested was performed on April 19. No abscess was appreciated. She was found to be quite significantly constipated with concern for rectal fecal impaction. T oday, the patient's nurse provides new images. The wound on her back is closing in  nicely. There is some heaped up senescent skin around the margin, but due to the inability of the patient to come to clinic, this cannot be debrided. The buttock wound tunnel has decreased in depth. We have been using iodoform packing strips in this location. The external orifice is about the same size and I do not see any evidence of tissue breakdown on the provided images. 01/16/2022: Telehealth visit with the patient via her home health care nurse. The case manager and I participated in the call. They were able to alleviate her fecal impaction with an enema. The home health nurse provided new images. The tunneling on her buttock wound has come in significantly and is now about 3 cm in depth. The wound on her back is much smaller and appears clean. 02/20/2022: Telehealth visit with the patient via her home health nurse. The case manager and I participated in the call. New images were provided. She had contacted our clinic with concern about some skin breakdown on the patient's thigh, but this has remained intact and they are applying a DuoDERM to the site. She was apparently seen in a local emergency room for concern of cellulitis and prescribed antibiotics. The erythema has since resolved. The wound on her back is smaller and we are packing this with silver alginate. She continues to have drainage from the buttock wound; the orifice is quite narrow and they are packing this with plain gauze packing strips. The tunnel is about the same length, roughly 3 cm. 03/20/2022: Telehealth visit with the patient via her home health nurse. The case manager and I participated in the call. New images were provided. The wound on her back is much smaller and we are packing this with silver alginate. She continues to have drainage from the buttock wound; the orifice is quite narrow and they are packing this with iodoform gauze packing strips. The tunnel is about the same length, roughly 3 cm. The nurse says the drainage  is clearing and is more serosanguineous rather than purulent. 04/17/2022: Telehealth visit with the patient via her home health nurse. The case manager and I participated on the call from our office in the clinic. The wound on her back is closed. The buttock wound has a purpleish fluctuant area just adjacent to it. There is still tunneling present and the drainage is serosanguineous. Patient History Unable to Obtain Patient History due to Dementia. Information  obtained from Patient. Allergies codeine, alteplase Family History Cancer - Siblings, Lung Disease - Siblings, No family history of Diabetes, Heart Disease, Hereditary Spherocytosis, Hypertension, Kidney Disease, Seizures, Stroke, Thyroid Problems, Tuberculosis. Social History Former smoker, Marital Status - Widowed, Alcohol Use - Never, Drug Use - No History, Caffeine Use - Daily. Medical History Eyes Denies history of Cataracts, Glaucoma, Optic Neuritis Ear/Nose/Mouth/Throat Denies history of Chronic sinus problems/congestion, Middle ear problems Hematologic/Lymphatic Denies history of Anemia, Hemophilia, Human Immunodeficiency Virus, Lymphedema, Sickle Cell Disease Respiratory Denies history of Aspiration, Asthma, Chronic Obstructive Pulmonary Disease (COPD), Pneumothorax, Sleep Apnea, Tuberculosis Cardiovascular Patient has history of Hypertension Denies history of Angina, Arrhythmia, Congestive Heart Failure, Coronary Artery Disease, Deep Vein Thrombosis, Hypotension, Myocardial Infarction, Peripheral Arterial Disease, Peripheral Venous Disease, Phlebitis, Vasculitis Gastrointestinal Denies history of Cirrhosis , Colitis, Crohnoos, Hepatitis A, Hepatitis B, Hepatitis C Endocrine Denies history of Type I Diabetes, Type II Diabetes Genitourinary Denies history of End Stage Renal Disease Immunological Denies history of Lupus Erythematosus, Raynaudoos, Scleroderma Integumentary (Skin) Denies history of History of  Burn Musculoskeletal Denies history of Gout, Rheumatoid Arthritis, Osteoarthritis, Osteomyelitis Neurologic Patient has history of Dementia Denies history of Neuropathy, Quadriplegia, Paraplegia, Seizure Disorder Oncologic Denies history of Received Chemotherapy, Received Radiation Psychiatric Denies history of Anorexia/bulimia, Confinement Anxiety Hospitalization/Surgery History - OR debridement/ metal plate removal 579FGE. Objective Constitutional Slightly bradycardic. Vital signs provided by the home health nurse.. Vitals Time Taken: 10:51 AM, Height: 62 in, Temperature: 97.5 F, Pulse: 58 bpm, Blood Pressure: 122/64 mmHg, Pulse Oximetry: 94 %. General Notes: per [facility] RN at pt's bedside General Notes: 04/17/2022: Wound evaluation via photos provided by the home health nurse. The wound on her back is closed. The buttock wound has a purpleish fluctuant area just adjacent to it. There is still tunneling present and the drainage is serosanguineous. Integumentary (Hair, Skin) Wound #5 status is Open. Original cause of wound was Gradually Appeared. The date acquired was: 09/12/2020. The wound has been in treatment 74 weeks. The wound is located on the Left Gluteus. The wound measures 0.1cm length x 0.2cm width x 0.2cm depth; 0.016cm^2 area and 0.003cm^3 volume. There is Fat Layer (Subcutaneous Tissue) exposed. There is no undermining noted, however, there is tunneling at 12:00 with a maximum distance of 2.3cm. There is a medium amount of sanguinous drainage noted. The wound margin is well defined and not attached to the wound base. There is large (67-100%) red granulation within the wound bed. There is no necrotic tissue within the wound bed. Wound #6 status is Healed - Epithelialized. Original cause of wound was Surgical Injury. The date acquired was: 09/12/2020. The wound has been in treatment 74 weeks. The wound is located on the Midline Back. The wound measures 0cm length x 0cm width x 0cm depth;  0cm^2 area and 0cm^3 volume. There is no tunneling or undermining noted. There is a none present amount of drainage noted. There is no granulation within the wound bed. There is no necrotic tissue within the wound bed. Assessment Active Problems ICD-10 Pressure ulcer of left buttock, stage 3 Plan Follow-up Appointments: Return appointment in 1 month. - Telehealth Wednesday 10/4 @ 1030 am Bathing/ Shower/ Hygiene: May shower and wash wound with soap and water. Off-Loading: Low air-loss mattress (Group 2) Turn and reposition every 2 hours Additional Orders / Instructions: Follow Nutritious Diet - Continue Juven and Nutrin Home Health: New wound care orders this week; continue Home Health for wound care. May utilize formulary equivalent dressing for wound treatment orders unless  otherwise specified. - add silver alginate to purple area proximal to wound opening Other Home Health Orders/Instructions: - [facility] 3x week. Clarise Cruz [phone] WOUND #5: - Gluteus Wound Laterality: Left Cleanser: Wound Cleanser (Home Health) 3 x Per Week/30 Days Discharge Instructions: Cleanse the wound with wound cleanser prior to applying a clean dressing using gauze sponges, not tissue or cotton balls. Prim Dressing: Iodoform packing strip 1/4 (in) 3 x Per Week/30 Days ary Discharge Instructions: Lightly pack as instructed Secondary Dressing: Zetuvit Plus Silicone Border Dressing 4x4 (in/in) ([place]) 3 x Per Week/30 Days Discharge Instructions: Apply silicone border or abd pad with tape over primary dressing as directed. 04/17/2022: Wound evaluation via photos provided by the home health nurse. The wound on her back is closed. The buttock wound has a purpleish fluctuant area just adjacent to it. There is still tunneling present and the drainage is serosanguineous. We will continue to try to pack the very tiny orifice to the buttock wound with iodoform, I suspect the bulla will  probably rupture so we will apply silver alginate to that site when it does. We will have another telehealth visit in 1 month. Electronic Signature(s) Signed: 04/17/2022 5:54:13 PM By: Baruch Gouty RN, BSN Signed: 04/18/2022 7:47:03 AM By: Fredirick Maudlin MD FACS Previous Signature: 04/17/2022 11:16:29 AM Version By: Fredirick Maudlin MD FACS Entered By: Baruch Gouty on 04/17/2022 17:02:41 -------------------------------------------------------------------------------- HxROS Details Patient Name: Date of Service: [name]. 04/17/2022 10:30 A M Medical Record Number: [id number] Patient Account Number: [id number] Date of Birth/Sex: Treating RN: 30-Oct-1940 (81 y.o. [name] Primary Care Provider: Nolene [name] Other Clinician: Referring Provider: Treating Provider/Extender: [name] in Treatment: 29 Unable to Obtain Patient History due to Dementia Information Obtained From Patient Eyes Medical History: Negative for: Cataracts; Glaucoma; Optic Neuritis Ear/Nose/Mouth/Throat Medical History: Negative for: Chronic sinus problems/congestion; Middle ear problems Hematologic/Lymphatic Medical History: Negative for: Anemia; Hemophilia; Human Immunodeficiency Virus; Lymphedema; Sickle Cell Disease Respiratory Medical History: Negative for: Aspiration; Asthma; Chronic Obstructive Pulmonary Disease (COPD); Pneumothorax; Sleep Apnea; Tuberculosis Cardiovascular Medical History: Positive for: Hypertension Negative for: Angina; Arrhythmia; Congestive Heart Failure; Coronary Artery Disease; Deep Vein Thrombosis; Hypotension; Myocardial Infarction; Peripheral Arterial Disease; Peripheral Venous Disease; Phlebitis; Vasculitis Gastrointestinal Medical History: Negative for: Cirrhosis ; Colitis; Crohns; Hepatitis A; Hepatitis B; Hepatitis C Endocrine Medical History: Negative for: Type I Diabetes; Type II Diabetes Genitourinary Medical  History: Negative for: End Stage Renal Disease Immunological Medical History: Negative for: Lupus Erythematosus; Raynauds; Scleroderma Integumentary (Skin) Medical History: Negative for: History of Burn Musculoskeletal Medical History: Negative for: Gout; Rheumatoid Arthritis; Osteoarthritis; Osteomyelitis Neurologic Medical History: Positive for: Dementia Negative for: Neuropathy; Quadriplegia; Paraplegia; Seizure Disorder Oncologic Medical History: Negative for: Received Chemotherapy; Received Radiation Psychiatric Medical History: Negative for: Anorexia/bulimia; Confinement Anxiety Immunizations Pneumococcal Vaccine: Received Pneumococcal Vaccination: No Implantable Devices None Hospitalization / Surgery History Type of Hospitalization/Surgery OR debridement/ metal plate removal [id number] Family and Social History Cancer: Yes - Siblings; Diabetes: No; Heart Disease: No; Hereditary Spherocytosis: No; Hypertension: No; Kidney Disease: No; Lung Disease: Yes - Siblings; Seizures: No; Stroke: No; Thyroid Problems: No; Tuberculosis: No; Former smoker; Marital Status - Widowed; Alcohol Use: Never; Drug Use: No History; Caffeine Use: Daily; Financial Concerns: No; Food, Clothing or Shelter Needs: No; Support System Lacking: No; Transportation Concerns: No Electronic Signature(s) Signed: 04/17/2022 11:31:48 AM By: Fredirick Maudlin MD FACS Signed: 04/17/2022 5:54:13 PM By: Baruch Gouty RN, BSN Entered By: Fredirick Maudlin on 04/17/2022 11:06:07 -------------------------------------------------------------------------------- SuperBill Details  Patient Name: Date of Service: [name], [name] 04/17/2022 Medical Record Number: [id number] Patient Account Number: [id number] Date of Birth/Sex: Treating RN: 01-08-1941 (81 y.o. [name] Primary Care Provider: Nolene [name] Other Clinician: Referring Provider: Treating Provider/Extender: [name] in  Treatment: 13 Diagnosis Coding ICD-10 Codes Code Description [phone] Pressure ulcer of left buttock, stage 3 Physician Procedures : CPT4 Code Description Modifier [id number] - WC PHYS LEVEL 3 - EST PT ICD-10 Diagnosis Description L89.323 Pressure ulcer of left buttock, stage 3 Quantity: 1 Electronic Signature(s) Signed: 04/17/2022 11:16:51 AM By: Fredirick Maudlin MD FACS Entered By: Fredirick Maudlin on 04/17/2022 11:16:51

## 2022-05-15 ENCOUNTER — Encounter (HOSPITAL_BASED_OUTPATIENT_CLINIC_OR_DEPARTMENT_OTHER): Payer: Medicare Other | Attending: General Surgery | Admitting: General Surgery

## 2022-05-15 ENCOUNTER — Encounter (HOSPITAL_BASED_OUTPATIENT_CLINIC_OR_DEPARTMENT_OTHER): Payer: Medicare Other | Admitting: General Surgery

## 2022-05-15 DIAGNOSIS — Z86718 Personal history of other venous thrombosis and embolism: Secondary | ICD-10-CM | POA: Diagnosis not present

## 2022-05-15 DIAGNOSIS — I69319 Unspecified symptoms and signs involving cognitive functions following cerebral infarction: Secondary | ICD-10-CM | POA: Insufficient documentation

## 2022-05-15 DIAGNOSIS — L89323 Pressure ulcer of left buttock, stage 3: Secondary | ICD-10-CM | POA: Insufficient documentation

## 2022-05-15 DIAGNOSIS — F0150 Vascular dementia without behavioral disturbance: Secondary | ICD-10-CM | POA: Diagnosis not present

## 2022-05-15 NOTE — Progress Notes (Signed)
[name], [name] ([id number]) Visit Report for 05/15/2022 Allergy List Details Patient Name: Date of Service: [name], [name] 05/15/2022 10:30 A M Medical Record Number: [id number] Patient Account Number: [id number] Date of Birth/Sex: Treating RN: [name] (81 y.o. [name] Primary Care [name]: [name] Other Clinician: Referring [name]: Treating [name]/Extender: [name] in Treatment: 78 Allergies Active Allergies codeine alteplase Allergy Notes Electronic Signature(s) Signed: 05/15/2022 10:53:40 AM By: Sarah Maudlin MD FACS Entered By: [name] on 05/15/2022 10:53:40 -------------------------------------------------------------------------------- Patient/Caregiver Education Details Patient Name: Date of Service: [name] 10/4/2023andnbsp10:30 A M Medical Record Number: [id number] Patient Account Number: [id number] Date of Birth/Gender: Treating RN: [name] (81 y.o. [name] Primary Care Physician: [name] Other Clinician: Referring Physician: Treating Physician/Extender: [name] in Treatment: 62 Education Assessment Education Provided To: Patient and Caregiver HH RN and son Education Topics Provided Wound/Skin Impairment: Methods: Explain/Verbal Responses: State content correctly [employer]) Signed: 05/15/2022 5:47:46 PM By: Sharyn Creamer RN, BSN Entered By: Sharyn Creamer on 05/15/2022 10:59:14 -------------------------------------------------------------------------------- Wound Assessment Details Patient Name: Date of Service: Sarah Seltzer D. 05/15/2022 10:30 A M Medical Record Number: [id number] Patient Account Number: [id number] Date of Birth/Sex: Treating RN: [name] (81 y.o. [name] Primary Care [name]: [name] Other Clinician: Referring [name]: Treating [name]/Extender: [name] in Treatment: 78 Wound Status Wound Number: 5 Primary Etiology: Pressure Ulcer Wound Location: Left Gluteus Wound Status: Open Wounding Event: Gradually Appeared Comorbid History: Hypertension, Dementia Date Acquired: 09/12/2020 Weeks Of Treatment: 78 Clustered Wound: No Wound Measurements Length: (cm) 3.5 Width: (cm) 2 Depth: (cm) 0.2 Area: (cm) 5.498 Volume: (cm) 1.1 % Reduction in Area: -133.4% % Reduction in Volume: 53.3% Epithelialization: Small (1-33%) Tunneling: Yes Position (o'clock): 11 Maximum Distance: (cm) 4.5 Undermining: No Wound Description Classification: Category/Stage III Wound Margin: Well defined, not attached Exudate Amount: Medium Exudate Type: Sanguinous Exudate Color: red Foul Odor After Cleansing: No Slough/Fibrino Yes Wound Bed Granulation Amount: Medium (34-66%) Exposed Structure Granulation Quality: Red Fascia Exposed: No Necrotic Amount: Medium (34-66%) Fat Layer (Subcutaneous Tissue) Exposed: Yes Necrotic Quality: Adherent Slough Tendon Exposed: No Muscle Exposed: No Joint Exposed: No Bone Exposed: No Periwound Skin Texture Texture Color No Abnormalities Noted: Yes No Abnormalities Noted: Yes Moisture Temperature / Pain No Abnormalities Noted: Yes Temperature: No Abnormality Electronic Signature(s) Signed: 05/15/2022 5:47:46 PM By: Sharyn Creamer RN, BSN Entered By: Sharyn Creamer on 05/15/2022 10:42:34 -------------------------------------------------------------------------------- [employer] Details Patient Name: Date of Service: Sarah Seltzer D. 05/15/2022 10:30 A M Medical Record Number: [id number] Patient Account Number: [id number] Date of Birth/Sex: Treating RN: [name] (81 y.o. [name] Primary Care [name]: [name] Other Clinician: Referring [name]: Treating [name]/Extender: [name] in Treatment: 8 Vital Signs Time Taken:  10:49 Temperature (F): 97.6 Height (in): 62 Pulse (bpm): 59 Blood Pressure (mmHg): 158/80 Reference Range: 80 - 120 mg / dl Airway Pulse Oximetry (%): 97 Notes Televisit HH nurse states all meds held this morning due to labs being drawn at hh visit. vitals reported per [facility] RN at pt bedside Electronic Signature(s) Signed: 05/15/2022 5:47:46 PM By: Sharyn Creamer RN, BSN Entered By: Sharyn Creamer on 05/15/2022 11:01:37

## 2022-05-16 NOTE — Progress Notes (Signed)
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 Peripheral Arterial Disease; Peripheral Venous Disease; Phlebitis; Vasculitis Gastrointestinal Medical History: Negative for: Cirrhosis ; Colitis; Crohns; Hepatitis A; Hepatitis B; Hepatitis  C Endocrine Medical History: Negative for: Type I Diabetes; Type II Diabetes Genitourinary Medical History: Negative for: End Stage Renal Disease Immunological Medical History: Negative for: Lupus Erythematosus; Raynauds; Scleroderma Integumentary (Skin) Medical History: Negative for: History of Burn Musculoskeletal Medical History: Negative for: Gout; Rheumatoid Arthritis; Osteoarthritis; Osteomyelitis Neurologic Medical History: Positive for: Dementia Negative for: Neuropathy; Quadriplegia; Paraplegia; Seizure Disorder Oncologic Medical History: Negative for: Received Chemotherapy; Received Radiation Psychiatric Medical History: Negative for: Anorexia/bulimia; Confinement Anxiety Immunizations Pneumococcal Vaccine: Received Pneumococcal Vaccination: No Implantable Devices None Hospitalization / Surgery History Type of Hospitalization/Surgery OR debridement/ metal plate removal [id number] Family and Social History Cancer: Yes - Siblings; Diabetes: No; Heart Disease: No; Hereditary Spherocytosis: No; Hypertension: No; Kidney Disease: No; Lung Disease: Yes - Siblings; Seizures: No; Stroke: No; Thyroid Problems: No; Tuberculosis: No; Former smoker; Marital Status - Widowed; Alcohol Use: Never; Drug Use: No History; Caffeine Use: Daily; Financial Concerns: No; Food, Clothing or Shelter Needs: No; Support System Lacking: No; Transportation Concerns: No Electronic Signature(s) Signed: 05/15/2022 11:36:38 AM By: Fredirick Maudlin MD FACS Signed: 05/15/2022 5:47:46 PM By: Sharyn Creamer RN, BSN Entered By: Fredirick Maudlin on 05/15/2022 10:53:46 -------------------------------------------------------------------------------- SuperBill Details Patient Name: Date of Service: [name] 05/15/2022 Medical Record Number: [id number] Patient Account Number: [id number] Date of Birth/Sex: Treating RN: Mar 04, 1941 (81 y.o. Donalda Ewings Primary Care Provider: Nolene Ebbs Other  Clinician: Referring Provider: Treating Provider/Extender: Consuelo Pandy in Treatment: 77 Diagnosis Coding ICD-10 Codes Code Description [phone] Pressure ulcer of left buttock, stage 3 Facility Procedures CPT4 Code: [id number] Description: Telehealth originating site facility fee. Modifier: Quantity: 1 Physician Procedures : CPT4 Code Description Modifier [id number] - WC PHYS LEVEL 4 - EST PT ICD-10 Diagnosis Description L89.323 Pressure ulcer of left buttock, stage 3 Quantity: 1 Electronic Signature(s) Signed: 05/15/2022 11:16:25 AM By: Fredirick Maudlin MD FACS Entered By: Fredirick Maudlin on 05/15/2022 11:16:25

## 2022-06-19 ENCOUNTER — Encounter (HOSPITAL_BASED_OUTPATIENT_CLINIC_OR_DEPARTMENT_OTHER): Payer: Medicare Other | Attending: General Surgery | Admitting: General Surgery

## 2022-06-19 DIAGNOSIS — Z7901 Long term (current) use of anticoagulants: Secondary | ICD-10-CM | POA: Diagnosis not present

## 2022-06-19 DIAGNOSIS — I428 Other cardiomyopathies: Secondary | ICD-10-CM | POA: Diagnosis not present

## 2022-06-19 DIAGNOSIS — Z8673 Personal history of transient ischemic attack (TIA), and cerebral infarction without residual deficits: Secondary | ICD-10-CM | POA: Insufficient documentation

## 2022-06-19 DIAGNOSIS — L89323 Pressure ulcer of left buttock, stage 3: Secondary | ICD-10-CM | POA: Insufficient documentation

## 2022-06-19 DIAGNOSIS — I10 Essential (primary) hypertension: Secondary | ICD-10-CM | POA: Diagnosis not present

## 2022-06-19 DIAGNOSIS — F0150 Vascular dementia without behavioral disturbance: Secondary | ICD-10-CM | POA: Diagnosis not present

## 2022-06-19 DIAGNOSIS — Z981 Arthrodesis status: Secondary | ICD-10-CM | POA: Insufficient documentation

## 2022-06-19 DIAGNOSIS — Z86718 Personal history of other venous thrombosis and embolism: Secondary | ICD-10-CM | POA: Diagnosis not present

## 2022-06-19 NOTE — Progress Notes (Signed)
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 [place]. She required extensive back surgery. After she came to [place] she apparently required a redo in this area but I have not seen any information on that this may be as long as 10 years ago. 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 READMISSION 11/10/2020 This is a patient we saw 1 time in December 2021. At that point she had an infected pressure ulcer extending down into the hardware of her back from previous surgery. We referred her to the emergency room. On 08/11/2020 she underwent lumbar hardware removal which had been previously placed for spinal fusion. Wound culture at that time showed E. coli and strep I think she received 6 weeks of Ancef. She went to a skilled facility. In the skilled facility there was some suggestion that the surgical area on her back almost closed over but then reopened. She also developed areas on the sacrum and 2 areas on the left buttock. Recently she was  admitted to hospital from 10/19/2020 through 11/07/2020 with wound infection, hypernatremia, aspiration pneumonia. The thoracic spine wound was felt to be infected culture of this area grew Pseudomonas and Klebsiella she received IV cefepime but was discharged on p.o. Amoxil and  ciprofloxacin that she is still taking. She also had a PEG tube insertion for nutrition and hydration. She is receiving promote also through this as a protein supplement. CT scan done during the hospitalization did not suggest acute osteomyelitis of the thoracic spine. They currently are using Santyl wet-to-dry to all wound areas. 4/15; she continues with a large wound with considerable undermining in the lower thoracic spine. She has an area on the sacrum and a tunneling wound on the left buttock. None of these looks particularly infected at this moment and there is no exposed bone in any wound. She is completing the Amoxil and ciprofloxacin We received a call from her pharmacy that I prescribed Augmentin for her and indeed looking at the records that is the case. I think this must have been an error because I did not comment on that and I can see why I would have done this. In any case she is completing her Augmentin and I do not think that will hurt. It does not look like she is being followed any but by anybody for these active dangerous infections however everything looks satisfactory at this point. I will review her last note from infectious disease and see what they wanted to do is follow-up for the thoracic spine wound which was initially infected hardware 4/29; patient presents for 2-week follow-up. Patient has multiple wounds that are located to her back, sacrum and buttocks. She has been using silver collagen with dressing changes. She states she overall feels well today with no questions or concerns. She denies any signs of infection. 5/13; 2-week follow-up. Patient has a large wound which was her surgical site in the  lower thoracic spine. This is a fairly large wound with very significant undermining. She also has an area in the left buttock which also has significant undermining. Sacral wound I think looks better. She had a's small additional wound on the left buttock that is healed. We have been using silver collagen on most of this but also Iodosorb on some of the more sloughy areas 5/27; patient presents for 2-week follow-up. She has been using collagen to all of the wounds. She reports no issues today. She denies signs of infection. She has her wound VAC today with her. She has not had this placed yet. 6/17; 2-3-week follow-up. She has the original infection wound in her back which was her original surgical site. This is in her lower thoracic spine. This is generally been doing better. Less undermining and no exposed bone Otherwise there is a mixed picture here. The sacral wound is healed however the left buttock wound has significant undermining of 8 cm from 2-5 o'clock maximum of 3 cm. This is a big deterioration. No obvious infection 6/30; 2-week follow-up. She has had the wound VAC on her surgical wound on her back bridging to the left buttock. This started last week 7/21. 3-week follow-up. Home health apparently called about 10 days ago to report they could no longer get the black foam in the tunnel of her left buttock wound. Working around this just did not seem to be in the cards so I think they are using iodoform packing strips. Still using the wound VAC on the back wound which is making decent progress. 8/17 follow-up visit. Left buttock wound apparently with purulent looking drainage. Back wound apparently slightly larger. We are using a wound VAC in this area 9/1; culture I did last time of the left buttock showed E. coli. I gave her cephalexin  solution. This has been completed however our intake nurse no still noticed some purulent looking drainage from this area. Tunneling depth at 6.5 cm. Silver  alginate packing strip. The area on the back which was the original wound actually looks better we are using a wound VAC in this area. 9/28; at the request of the patient's son who is her POA. The patient was seen by virtual visit. The problem here is that in transporting the patient to our clinic the patient was billed at thousand dollars by ambulance service which apparently was not covered at all by her insurance. I did not really look into the latter but did agree to see her virtually. We saw her today in the accompaniment of her son who was grateful and the patient's home health nurse Britta Mccreedy. The patient has 2 open areas. One was a her original surgical wound on her back. We have been using a wound VAC here. The home health nurse reports a lot more pain around the wound although there is no purulent drainage. Palpation of the wound results and discomfort. The other wound is on her left buttock. This is down to 5.7 cm of depth there is no purulent drainage here we have been using gentamicin for treatment of her previous gram-negative's [E. coli] in this area as well as silver alginate packing. The wound orifice is too small to consider many other dressing 10/12; we saw the patient today with the assistance of the patient's home health nurse. She is not able to be transported to clinic therefore in the interest of ongoing patient care, collaboration with her home health colleagues we have seen her now for the second time via telehealth She has 2 open wounds 1 was her original surgical wound on the back. We stopped the wound VAC last visit because of skin irritation we have been using silver alginate with some good improvement in the wound surface area. However the drainage on the dressing really looked worrisome. I had ordered a culture last time but apparently there were difficulties with this for 1 reason or another this was not done therefore I have reordered that today. The area on the  left buttock is now down to 6 5 cm use silver alginate packing removed with gentamicin 10/26; telehealth visit with the assistance of the patient's home health nurse. The patient's son was present. We do this because of the difficulty transporting the patient to our clinic and the cost of such transport. Culture we did last time of the back wound grew MSSA. I gave him 7 days of Levaquin and topical Bactroban. Home health says the wound is doing better measuring smaller and seems to have less drainage. The remaining area on the left buttock still is 5.7 cm in depth. This has not improved in several weeks. We have been using silver alginate to both wound areas The area on her buttock is not responding. There is minimal to moderate drainage per the home health nurse but no surrounding tenderness demonstrated today 11/9; telehealth visit with the assistance of the patient's home health nurse. The patient's son was also present. We do this because of the difficulty in transporting her to our clinic and the cost of such transport. The original wound was her area on the back with infected hardware. This is really coming nicely. We have been using silver alginate. The area on the left buttock is now down to 5 cm in depth from 5.7 the last time. This is also improved  the patient does not have any tenderness around her wounds. Home health seems quite happy that the wounds are contracting 12/7; monthly telehealth visit in cooperation with the patient's home health nurse. I was present with one of our wound care nurses. I do not believe her son was present today. She has a wound on her back is actually somewhat better where the area on the left buttock still has 5 cm of probing depth. This was at 1 point a stage III wound. The home care nurse relates of bloody drainage. We have been using silver alginate 12/21; 2-week follow-up via telehealth. We have been doing this because of the patient's severe dementia and  difficulty in transportation. The patient was accompanied by her home health nurse and the patient's son. Our intake nurse and myself interviewed We have been following 2 wound areas 1 on the back which was initially an infected pressure ulcer extending in the hardware. This was surgically removed we have been making nice progress here using gentamicin and silver alginate [name], [name] ([id number]) [id number] Page 6 of 9 She has the remanence of a stage III pressure ulcer on her left buttock. This is a probing sinus today at 3.7 cm this would be an improvement. We have been using silver alginate packing rope here as well 08/22/2021 again we had a telehealth visit with this patient, her home health nurse. Her son was also present. Our case Production designer, theatre/television/film myself and RN. We have been doing this due to the impossibility of transporting this patient to our clinic for usual reviews. She has an improving surgical wound on her back both in terms of condition and dimension. We are using gentamicin and silver alginate. On her left buttock, she has a sinus leg wound which is the remanent of an initial pressure ulcer. We were called and notified last week by home health that she now has a separate sinus tract going superiorly. Both of these are about 3 cm. We have been using silver alginate and gentamicin here as well and we were a bit hopeful last time that this was getting better however apparently there is a new sinus tract 09/19/2021; telemetry health visit with the patient her home health nurse. Myself and our case manager. This is done because of the inability to transport the patient to the clinic. We have been using gentamicin and silver alginate on the original surgical wound on her back. The left buttock wound is a remnant of an initial pressure ulcer. One of the sinus tracts in this area has closed and according to the nurse a lot less purulent drainage 10/17/2021: T elehealth  visit with the patient via her home health nurse. Our case manager and I participated in the call. Due to lack of funds for transportation, she is unable to be seen in clinic. We have been continuing the gentamicin and silver alginate to the surgical wound. The left buttock wound is closing, but it does have a sinus tract that drains thick greenish-white material that the nurse states does not have a significant odor, but it is fairly copious. She was last cultured in December. Corynebacterium grew. As per Dr. Jannetta Quint previous notes, he has been hoping to pursue a CT scan to better evaluate the area, due to concern for underlying abscess or other untreated process contributing to this ongoing drainage. The same issue with funds for transportation to the clinic also applies to transportation for CT scan. The patient's nurse reports that the patient's son has  saved up about half of what would be required to transport her for the scan. They will let us know when they are able to arrange this. Images were provided by the home health nurse. I personally reviewed these. There really is not much open on the buttock except for the sinus tract. The old surgical wound on her back appears quite healthy with good granulation tissue. Compared to prior images, this wound has closed up significantly. 11/14/2021: T elehealth visit with the patient via her home health nurse. Her case manager and I participated in the call. We had a culture performed at her last visit, per the home health nurse and this was negative for any growth. The nurse reports that the patient's son has been able to save up the money necessary to transport the patient for CT scan. She also reported that the tract on the buttock has gotten wider, but the orifice has actually narrowed. They are having difficulty removing all of the silver alginate from the wound without it shearing off. The drainage from the site is copious and dark green-brown. The  nurse provided images of the buttock wound, as well as the wound on her back. The back wound continues to contract and appears to have a relatively healthy surface. 12/19/2021: T elehealth visit with the patient via her home health nurse. The case manager and I participated in the call. The CT scan that we had requested was performed on April 19. No abscess was appreciated. She was found to be quite significantly constipated with concern for rectal fecal impaction. T oday, the patient's nurse provides new images. The wound on her back is closing in nicely. There is some heaped up senescent skin around the margin, but due to the inability of the patient to come to clinic, this cannot be debrided. The buttock wound tunnel has decreased in depth. We have been using iodoform packing strips in this location. The external orifice is about the same size and I do not see any evidence of tissue breakdown on the provided images. 01/16/2022: Telehealth visit with the patient via her home health care nurse. The case manager and I participated in the call. They were able to alleviate her fecal impaction with an enema. The home health nurse provided new images. The tunneling on her buttock wound has come in significantly and is now about 3 cm in depth. The wound on her back is much smaller and appears clean. 02/20/2022: Telehealth visit with the patient via her home health nurse. The case manager and I participated in the call. New images were provided. She had contacted our clinic with concern about some skin breakdown on the patient's thigh, but this has remained intact and they are applying a DuoDERM to the site. She was apparently seen in a local emergency room for concern of cellulitis and prescribed antibiotics. The erythema has since resolved. The wound on her back is smaller and we are packing this with silver alginate. She continues to have drainage from the buttock wound; the orifice is quite narrow and they  are packing this with plain gauze packing strips. The tunnel is about the same length, roughly 3 cm. 03/20/2022: Telehealth visit with the patient via her home health nurse. The case manager and I participated in the call. New images were provided. The wound on her back is much smaller and we are packing this with silver alginate. She continues to have drainage from the buttock wound; the orifice is quite narrow and they  are packing this with iodoform gauze packing strips. The tunnel is about the same length, roughly 3 cm. The nurse says the drainage is clearing and is more serosanguineous rather than purulent. 04/17/2022: Telehealth visit with the patient via her home health nurse. The case manager and I participated on the call from our office in the clinic. The wound on her back is closed. The buttock wound has a purpleish fluctuant area just adjacent to it. There is still tunneling present and the drainage is serosanguineous. 05/15/2022: T elehealth visit with the patient via her home health nurse, Sarah. The case manager and I participated in the call from our office in the clinic. Since our last visit, the patient developed 2 areas adjacent to her back wound that look like blood blisters. Maralyn Sago had contacted [place] at the end of last week and we advised applying silver alginate to both sites. She says that they are stable and do not seem to be progressing. Photographs provided by Maralyn Sago demonstrate this. The area of purpleish discoloration adjacent to the buttock wound has started to open, as we had feared. The nature of the drainage from the tunnel has also changed. It is more seropurulent than mucopurulent. The patient also expressed some pain today when the wound was being probed for depth. 06/19/2022: T elehealth visit with the patient via her home health nurse Sarah. The case manager and I participated in the call from our office in the wound care center. The patient and her nurse were at the patient's  home. The blood blisters on the patient's back have dried and flattened out. The area of purpleish discoloration adjacent to the buttock wound has now basically formed tunneling area that seems to connect to the old tunnel. The drainage is serosanguineous rather than purulent. It is about 2.5 cm, down from 3.5 cm. Photographs of both sites were provided by the patient's nurse. Patient History Unable to Obtain Patient History due to Dementia. Information obtained from Patient. Allergies codeine, alteplase Family History Cancer - Siblings, Lung Disease - Siblings, No family history of Diabetes, Heart Disease, Hereditary Spherocytosis, Hypertension, Kidney Disease, Seizures, Stroke, Thyroid Problems, Tuberculosis. Social History Former smoker, Marital Status - Widowed, Alcohol Use - Never, Drug Use - No History, Caffeine Use - Daily. Medical History Eyes Denies history of Cataracts, Glaucoma, Optic Neuritis Ear/Nose/Mouth/Throat Denies history of Chronic sinus problems/congestion, Middle ear problems Hematologic/Lymphatic Denies history of Anemia, Hemophilia, Human Immunodeficiency Virus, Lymphedema, Sickle Cell Disease Glance, [name] ([id number]) [id number] Page 7 of 9 Respiratory Denies history of Aspiration, Asthma, Chronic Obstructive Pulmonary Disease (COPD), Pneumothorax, Sleep Apnea, Tuberculosis Cardiovascular Patient has history of Hypertension Denies history of Angina, Arrhythmia, Congestive Heart Failure, Coronary Artery Disease, Deep Vein Thrombosis, Hypotension, Myocardial Infarction, Peripheral Arterial Disease, Peripheral Venous Disease, Phlebitis, Vasculitis Gastrointestinal Denies history of Cirrhosis , Colitis, Crohnoos, Hepatitis A, Hepatitis B, Hepatitis C Endocrine Denies history of Type I Diabetes, Type II Diabetes Genitourinary Denies history of End Stage Renal Disease Immunological Denies history of Lupus Erythematosus,  Raynaudoos, Scleroderma Integumentary (Skin) Denies history of History of Burn Musculoskeletal Denies history of Gout, Rheumatoid Arthritis, Osteoarthritis, Osteomyelitis Neurologic Patient has history of Dementia Denies history of Neuropathy, Quadriplegia, Paraplegia, Seizure Disorder Oncologic Denies history of Received Chemotherapy, Received Radiation Psychiatric Denies history of Anorexia/bulimia, Confinement Anxiety Hospitalization/Surgery History - OR debridement/ metal plate removal 11/91. Objective Constitutional Slightly bradycardic. Vitals Time Taken: 10:58 AM, Height: 62 in, Temperature: 97.4 F, Pulse: 59 bpm, Respiratory Rate: 16 breaths/min, Blood Pressure: 140/72 mmHg, Pulse  Oximetry: 93 %. General Notes: 06/19/2022: Wound evaluation based upon photos provided by the home health nurse and description provided by same. The blood blisters on the patient's back have dried and flattened out. The area of purpleish discoloration adjacent to the buttock wound has now basically formed tunneling area that seems to connect to the old tunnel. The drainage is serosanguineous rather than purulent. It is about 2.5 cm, down from 3.5 cm. Assessment Active Problems ICD-10 Pressure ulcer of left buttock, stage 3 Plan 06/19/2022: Wound evaluation based upon photos provided by the home health nurse and description provided by same. The blood blisters on the patient's back have dried and flattened out. The area of purpleish discoloration adjacent to the buttock wound has now basically formed tunneling area that seems to connect to the old tunnel. The drainage is serosanguineous rather than purulent. It is about 2.5 cm, down from 3.5 cm. At this point, no need to do anything for the areas that had been blood blisters. Maralyn Sago will continue to pack the tunnel on the patient's buttocks with iodoform packing strips. Continue to encourage adequate protein intake and offload wounded areas. T  elehealth visit in 1 month. Maralyn Sago will contact us with any questions or concerns in the interim. Electronic Signature(s) Signed: 06/19/2022 12:09:12 PM By: Duanne Guess MD FACS Entered By: Duanne Guess on 06/19/2022 12:09:11 [name], [name] ([id number]) [id number] Page 8 of 9 -------------------------------------------------------------------------------- HxROS Details Patient Name: Date of Service: [name], [name] 06/19/2022 10:30 A M Medical Record Number: [id number] Patient Account Number: [id number] Date of Birth/Sex: Treating RN: 07-31-1941 (81 y.o. F) Primary Care Provider: [name] Other Clinician: Referring Provider: Treating Provider/Extender: Bud Face in Treatment: 59 Unable to Obtain Patient History due to Dementia Information Obtained From Patient Eyes Medical History: Negative for: Cataracts; Glaucoma; Optic Neuritis Ear/Nose/Mouth/Throat Medical History: Negative for: Chronic sinus problems/congestion; Middle ear problems Hematologic/Lymphatic Medical History: Negative for: Anemia; Hemophilia; Human Immunodeficiency Virus; Lymphedema; Sickle Cell Disease Respiratory Medical History: Negative for: Aspiration; Asthma; Chronic Obstructive Pulmonary Disease (COPD); Pneumothorax; Sleep Apnea; Tuberculosis Cardiovascular Medical History: Positive for: Hypertension Negative for: Angina; Arrhythmia; Congestive Heart Failure; Coronary Artery Disease; Deep Vein Thrombosis; Hypotension; Myocardial Infarction; Peripheral Arterial Disease; Peripheral Venous Disease; Phlebitis; Vasculitis Gastrointestinal Medical History: Negative for: Cirrhosis ; Colitis; Crohns; Hepatitis A; Hepatitis B; Hepatitis C Endocrine Medical History: Negative for: Type I Diabetes; Type II Diabetes Genitourinary Medical History: Negative for: End Stage Renal Disease Immunological Medical History: Negative for: Lupus  Erythematosus; Raynauds; Scleroderma Integumentary (Skin) Medical History: Negative for: History of Burn Musculoskeletal Medical History: Negative for: Gout; Rheumatoid Arthritis; Osteoarthritis; Osteomyelitis [name], [name] ([id number]) [id number] Page 9 of 9 Neurologic Medical History: Positive for: Dementia Negative for: Neuropathy; Quadriplegia; Paraplegia; Seizure Disorder Oncologic Medical History: Negative for: Received Chemotherapy; Received Radiation Psychiatric Medical History: Negative for: Anorexia/bulimia; Confinement Anxiety Immunizations Pneumococcal Vaccine: Received Pneumococcal Vaccination: No Implantable Devices None Hospitalization / Surgery History Type of Hospitalization/Surgery OR debridement/ metal plate removal 08/65 Family and Social History Cancer: Yes - Siblings; Diabetes: No; Heart Disease: No; Hereditary Spherocytosis: No; Hypertension: No; Kidney Disease: No; Lung Disease: Yes - Siblings; Seizures: No; Stroke: No; Thyroid Problems: No; Tuberculosis: No; Former smoker; Marital Status - Widowed; Alcohol Use: Never; Drug Use: No History; Caffeine Use: Daily; Financial Concerns: No; Food, Clothing or Shelter Needs: No; Support System Lacking: No; Transportation Concerns: No Electronic Signature(s) Signed: 06/19/2022 12:55:38 PM By: Duanne Guess MD FACS Entered By: Duanne Guess on 06/19/2022 12:04:32 -------------------------------------------------------------------------------- SuperBill Details Patient  Name: Date of Service: [name], [name] 06/19/2022 Medical Record Number: [id number] Patient Account Number: [id number] Date of Birth/Sex: Treating RN: [birth year] (81 y.o. F) Primary Care Provider: [name] Other Clinician: Referring Provider: Treating Provider/Extender: Bud Face in Treatment: 107 Diagnosis Coding ICD-10 Codes Code Description [id number] Pressure ulcer of  left buttock, stage 3 Physician Procedures : CPT4 Code Description Modifier [id number] - WC PHYS LEVEL 4 - EST PT ICD-10 Diagnosis Description L89.323 Pressure ulcer of left buttock, stage 3 Quantity: 1 Electronic Signature(s) Signed: 06/19/2022 12:09:20 PM By: Duanne Guess MD FACS Entered By: Duanne Guess on 06/19/2022 12:09:19

## 2022-06-19 NOTE — Progress Notes (Addendum)
[name] ([id number]) [id number] Page 1 of 3 Visit Report for 06/19/2022 Allergy List Details Patient Name: Date of Service: [name], [name] 06/19/2022 10:30 A M Medical Record Number: [id number] Patient Account Number: [id number] Date of Birth/Sex: Treating RN: 08-29-1940 (81 y.o. F) Primary Care [name]: [name] Other Clinician: Referring [name]: Treating Kanijah Groseclose/Extender: Bud Face in Treatment: 47 Allergies Active Allergies codeine alteplase Allergy Notes Electronic Signature(s) Signed: 06/19/2022 12:04:26 PM By: Duanne Guess MD FACS Entered By: Duanne [name] on 06/19/2022 12:04:26 -------------------------------------------------------------------------------- Arrival Information Details Patient Name: Date of Service: Sarah Hose D. 06/19/2022 10:30 A M Medical Record Number: [id number] Patient Account Number: [id number] Date of Birth/Sex: Treating RN: March 19, 1941 (81 y.o. [name] Primary Care [name]: [name] Other Clinician: Referring [name]: Treating [name]/Extender: Bud Face in Treatment: 65 Visit Information Patient Arrived: Other Arrival Time: 10:52 Transfer Assistance: None Patient Requires Transmission-Based Precautions: No Patient Has Alerts: No History Since Last Visit Added or deleted any medications: No Any new allergies or adverse reactions: No Had a fall or experienced change in activities of daily living that may affect risk of falls: No Signs or symptoms of abuse/neglect since last visito No Hospitalized since last visit: No Pain Present Now: Yes Notes telehealth visit Electronic Signature(s) Signed: 06/19/2022 4:59:38 PM By: Zenaida Deed RN, BSN Entered By: Zenaida Deed on 06/19/2022 10:54:08 [name], [name] ([id number]) [id number] Page 2 of  3 -------------------------------------------------------------------------------- Pain Assessment Details Patient Name: Date of Service: [name], [name] 06/19/2022 10:30 A M Medical Record Number: [id number] Patient Account Number: [id number] Date of Birth/Sex: Treating RN: [birth year] (81 y.o. [name] Primary Care Cheral [name]: [name] Other Clinician: Referring [name]: Treating [name]/Extender: Bud Face in Treatment: 53 Active Problems Location of Pain Severity and Description of Pain Patient Has Paino Yes Site Locations Pain Location: Pain in Ulcers With Dressing Change: Yes Duration of the Pain. Constant / Intermittento Intermittent Rate the pain. Current Pain Level: 0 Worst Pain Level: 2 Least Pain Level: 0 Character of Pain Describe the Pain: Tender Pain Management and Medication Current Pain Management: Electronic Signature(s) Signed: 06/19/2022 4:59:38 PM By: Zenaida Deed RN, BSN Entered By: Zenaida Deed on 06/19/2022 11:00:01 -------------------------------------------------------------------------------- Wound Assessment Details Patient Name: Date of Service: Sarah Hose D. 06/19/2022 10:30 A M Medical Record Number: [id number] Patient Account Number: [id number] Date of Birth/Sex: Treating RN: 02/17/1941 (81 y.o. [name] Primary Care [name]: [name] Other Clinician: Referring [name]: Treating [name]/Extender: Bud Face in Treatment: 23 Wound Status Wound Number: 5 Primary Etiology: Pressure Ulcer Wound Location: Left Gluteus Wound Status: Open Wounding Event: Gradually Appeared Comorbid History: Hypertension, Dementia Date Acquired: 09/12/2020 Weeks Of Treatment: 83 Clustered Wound: No [name] ([id number]) [id number] Page 3 of 3 Wound Measurements Length: (cm) 3.5 Width: (cm) 2 Depth: (cm) 0.1 Area: (cm)  5.498 Volume: (cm) 0.55 % Reduction in Area: -133.4% % Reduction in Volume: 76.7% Epithelialization: Small (1-33%) Wound Description Classification: Category/Stage III Wound Margin: Well defined, not attached Exudate Amount: Medium Exudate Type: Sanguinous Exudate Color: red Foul Odor After Cleansing: No Slough/Fibrino Yes Wound Bed Granulation Amount: Medium (34-66%) Exposed Structure Granulation Quality: Red Fascia Exposed: No Necrotic Amount: Medium (34-66%) Fat Layer (Subcutaneous Tissue) Exposed: Yes Necrotic Quality: Adherent Slough Tendon Exposed: No Muscle Exposed: No Joint Exposed: No Bone Exposed: No Periwound Skin Texture Texture Color No Abnormalities Noted: Yes No Abnormalities Noted: Yes Moisture Temperature / Pain No Abnormalities Noted: Yes Temperature: No Abnormality  Electronic Signature(s) Signed: 06/20/2022 5:32:43 PM By: Zenaida Deed RN, BSN Previous Signature: 06/19/2022 4:59:38 PM Version By: Zenaida Deed RN, BSN Entered By: Zenaida Deed on 06/20/2022 11:31:16 -------------------------------------------------------------------------------- Vitals Details Patient Name: Date of Service: Sarah Hose D. 06/19/2022 10:30 A M Medical Record Number: [id number] Patient Account Number: [id number] Date of Birth/Sex: Treating RN: September 10, 1940 (81 y.o. [name] Primary Care [name]: [name] Other Clinician: Referring [name]: Treating [name]/Extender: Bud Face in Treatment: 37 Vital Signs Time Taken: 10:58 Temperature (F): 97.4 Height (in): 62 Pulse (bpm): 59 Respiratory Rate (breaths/min): 16 Blood Pressure (mmHg): 140/72 Reference Range: 80 - 120 mg / dl Airway Pulse Oximetry (%): 93 Inhaled Oxygen Concentration (%): 0 Electronic Signature(s) Signed: 06/19/2022 4:59:38 PM By: Zenaida Deed RN, BSN Entered By: Zenaida Deed on 06/19/2022 10:59:18

## 2022-07-17 ENCOUNTER — Encounter (HOSPITAL_BASED_OUTPATIENT_CLINIC_OR_DEPARTMENT_OTHER): Payer: Medicare Other | Attending: General Surgery | Admitting: General Surgery

## 2022-07-17 DIAGNOSIS — L89323 Pressure ulcer of left buttock, stage 3: Secondary | ICD-10-CM | POA: Insufficient documentation

## 2022-07-17 DIAGNOSIS — Z87891 Personal history of nicotine dependence: Secondary | ICD-10-CM | POA: Diagnosis not present

## 2022-07-17 DIAGNOSIS — Z7901 Long term (current) use of anticoagulants: Secondary | ICD-10-CM | POA: Diagnosis not present

## 2022-07-17 DIAGNOSIS — I10 Essential (primary) hypertension: Secondary | ICD-10-CM | POA: Insufficient documentation

## 2022-07-17 DIAGNOSIS — Z86718 Personal history of other venous thrombosis and embolism: Secondary | ICD-10-CM | POA: Insufficient documentation

## 2022-07-17 DIAGNOSIS — I428 Other cardiomyopathies: Secondary | ICD-10-CM | POA: Diagnosis not present

## 2022-07-17 DIAGNOSIS — F0390 Unspecified dementia without behavioral disturbance: Secondary | ICD-10-CM | POA: Diagnosis not present

## 2022-07-17 DIAGNOSIS — Z981 Arthrodesis status: Secondary | ICD-10-CM | POA: Insufficient documentation

## 2022-07-18 NOTE — Progress Notes (Signed)
Jaime, Sharin Grave ([id number]) [phone].pdf Page 1 of 3 Visit Report for 07/17/2022 Allergy List Details Patient Name: Date of Service: [name], [name] 07/17/2022 10:30 A M Medical Record Number: [id number] Patient Account Number: [id number] Date of Birth/Sex: Treating RN: [birth year] (81 y.o. Orville Govern Primary Care Bandon Sherwin: [name] Other Clinician: Referring Dione Mccombie: Treating Rockie Vawter/Extender: Bud Face in Treatment: 95 Allergies Active Allergies codeine alteplase Allergy Notes Electronic Signature(s) Signed: 07/17/2022 12:01:07 PM By: Duanne Guess MD FACS Entered By: Duanne Guess on 07/17/2022 12:01:07 -------------------------------------------------------------------------------- Patient/Caregiver Education Details Patient Name: Date of Service: [name] 12/6/2023andnbsp10:30 A M Medical Record Number: [id number] Patient Account Number: [id number] Date of Birth/Gender: Treating RN: 03-May-1941 (81 y.o. Orville Govern Primary Care Physician: [name] Other Clinician: Referring Physician: Treating Physician/Extender: Bud Face in Treatment: 12 Education Assessment Education Provided To: Careers information officer, RN Education Topics Provided Wound/Skin Impairment: Methods: Explain/Verbal Responses: State content correctly [employer]) Signed: 07/17/2022 4:06:00 PM By: Redmond Pulling RN, BSN Entered By: Redmond Pulling on 07/17/2022 11:31:48 Bissonnette, Sharin Grave ([id number]) [id number] Page 2 of 3 -------------------------------------------------------------------------------- Wound Assessment Details Patient Name: Date of Service: [name], [name] 07/17/2022 10:30 A M Medical Record Number: [id number] Patient Account Number: [id number] Date of Birth/Sex: Treating RN: Apr 14, 1941 (81 y.o. Orville Govern Primary Care  Medea Deines: [name] Other Clinician: Referring Rondall Radigan: Treating Verma Grothaus/Extender: Bud Face in Treatment: 87 Wound Status Wound Number: 5 Primary Etiology: Pressure Ulcer Wound Location: Left Gluteus Wound Status: Open Wounding Event: Gradually Appeared Comorbid History: Hypertension, Dementia Date Acquired: 09/12/2020 Weeks Of Treatment: 87 Clustered Wound: No Photos Wound Measurements Length: (cm) 3.5 Width: (cm) 1 Depth: (cm) 0.1 Area: (cm) 2.749 Volume: (cm) 0.275 % Reduction in Area: -16.7% % Reduction in Volume: 88.3% Epithelialization: Small (1-33%) Tunneling: Yes Position (o'clock): 12 Maximum Distance: (cm) 3 Undermining: No Wound Description Classification: Category/Stage III Wound Margin: Well defined, not attached Exudate Amount: Medium Exudate Type: Serosanguineous Exudate Color: red, brown Foul Odor After Cleansing: No Slough/Fibrino Yes Wound Bed Granulation Amount: Medium (34-66%) Exposed Structure Granulation Quality: Red Fascia Exposed: No Necrotic Amount: Medium (34-66%) Fat Layer (Subcutaneous Tissue) Exposed: Yes Hauser, Sharin Grave ([id number]) [phone].pdf Page 3 of 3 Necrotic Quality: Adherent Slough Tendon Exposed: No Muscle Exposed: No Joint Exposed: No Bone Exposed: No Periwound Skin Texture Texture Color No Abnormalities Noted: Yes No Abnormalities Noted: Yes Moisture Temperature / Pain No Abnormalities Noted: Yes Temperature: No Abnormality Electronic Signature(s) Signed: 07/17/2022 4:06:00 PM By: Redmond Pulling RN, BSN Entered By: Redmond Pulling on 07/17/2022 11:36:41 -------------------------------------------------------------------------------- Vitals Details Patient Name: Date of Service: Sarah Hose D. 07/17/2022 10:30 A M Medical Record Number: [id number] Patient Account Number: [id number] Date of Birth/Sex: Treating RN: March 16, 1941 (81 y.o. Orville Govern Primary Care Rosealie Reach: [name] Other Clinician: Referring Clarece Drzewiecki: Treating Jaleesa Cervi/Extender: Bud Face in Treatment: 76 Vital Signs Time Taken: 11:00 Temperature (F): 97.9 Height (in): 62 Pulse (bpm): 59 Respiratory Rate (breaths/min): 16 Blood Pressure (mmHg): 150/78 Reference Range: 80 - 120 mg / dl Electronic Signature(s) Signed: 07/17/2022 4:06:00 PM By: Redmond Pulling RN, BSN Entered By: Redmond Pulling on 07/17/2022 11:28:45

## 2022-07-18 NOTE — Progress Notes (Signed)
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 Border Dressing 4x4 (in/in) (Home Health) 3 x Per Week/30 Days Discharge Instructions: Apply silicone  border or abd pad with tape over primary dressing as directed. 07/17/2022: Wound evaluation based upon photos provided by the home health nurse and description provided by same. The buttock wound is larger and more open, but the surface is clean. Periwound skin appears to be in good condition. Given the change in the character of the drainage I think we can discontinue gentamicin. The home health nurse did request a refill on this in case we need it again in the future so I provided it. With the larger wound surface area and the quantity of drainage, we are going to change from packing the wound with iodoform packing strips to packing it with silver alginate. Maralyn Sago will contact us if she has any further concerns, otherwise we will schedule another telehealth visit in 1 month. Electronic Signature(s) Signed: 07/17/2022 12:11:43 PM By: Duanne Guess MD FACS Entered By: Duanne Guess on 07/17/2022 12:11:42 -------------------------------------------------------------------------------- HxROS Details Patient Name: Date of Service: [name] 07/17/2022 10:30 A M Medical Record Number: [id number] Patient Account Number: [id number] FANTA, WIMBERLEY ([id number]) [phone].pdf Page 9 of 10 Date of Birth/Sex: Treating RN: [birth year] (81 y.o. Orville Govern Primary Care Provider: Other Clinician: Fleet [name] Referring Provider: Treating Provider/Extender: [name] in Treatment: 39 Unable to Obtain Patient History due to Dementia Information Obtained From Patient Eyes Medical History: Negative for: Cataracts; Glaucoma; Optic Neuritis Ear/Nose/Mouth/Throat Medical History: Negative for: Chronic sinus problems/congestion; Middle ear problems Hematologic/Lymphatic Medical History: Negative for: Anemia; Hemophilia; Human Immunodeficiency Virus; Lymphedema; Sickle Cell Disease Respiratory Medical History: Negative for:  Aspiration; Asthma; Chronic Obstructive Pulmonary Disease (COPD); Pneumothorax; Sleep Apnea; Tuberculosis Cardiovascular Medical History: Positive for: Hypertension Negative for: Angina; Arrhythmia; Congestive Heart Failure; Coronary Artery Disease; Deep Vein Thrombosis; Hypotension; Myocardial Infarction; Peripheral Arterial Disease; Peripheral Venous Disease; Phlebitis; Vasculitis Gastrointestinal Medical History: Negative for: Cirrhosis ; Colitis; Crohns; Hepatitis A; Hepatitis B; Hepatitis C Endocrine Medical History: Negative for: Type I Diabetes; Type II Diabetes Genitourinary Medical History: Negative for: End Stage Renal Disease Immunological Medical History: Negative for: Lupus Erythematosus; Raynauds; Scleroderma Integumentary (Skin) Medical History: Negative for: History of Burn Musculoskeletal Medical History: Negative for: Gout; Rheumatoid Arthritis; Osteoarthritis; Osteomyelitis Neurologic Medical History: Positive for: Dementia Negative for: Neuropathy; Quadriplegia; Paraplegia; Seizure Disorder Oncologic Medical History: Negative for: Received Chemotherapy; Received Radiation ITZIA, CUNLIFFE ([id number]) [id number] Page 10 of 10 Psychiatric Medical History: Negative for: Anorexia/bulimia; Confinement Anxiety Immunizations Pneumococcal  Vaccine: Received Pneumococcal Vaccination: No Implantable Devices None Hospitalization / Surgery History Type of Hospitalization/Surgery OR debridement/ metal plate removal 16/10 Family and Social History Cancer: Yes - Siblings; Diabetes: No; Heart Disease: No; Hereditary Spherocytosis: No; Hypertension: No; Kidney Disease: No; Lung Disease: Yes - Siblings; Seizures: No; Stroke: No; Thyroid Problems: No; Tuberculosis: No; Former smoker; Marital Status - Widowed; Alcohol Use: Never; Drug Use: No History; Caffeine Use: Daily; Financial Concerns: No; Food, Clothing or Shelter Needs: No; Support  System Lacking: No; Transportation Concerns: No Psychologist, prison and probation services) Signed: 07/17/2022 4:06:00 PM By: Redmond Pulling RN, BSN Signed: 07/17/2022 5:07:18 PM By: Duanne Guess MD FACS Entered By: Duanne Guess on 07/17/2022 12:01:18 -------------------------------------------------------------------------------- SuperBill Details Patient Name: Date of Service: [name] 07/17/2022 Medical Record Number: [id number] Patient Account Number: [id number] Date of Birth/Sex: Treating RN: 07/26/1941 (81 y.o. Orville Govern Primary Care Provider: Fleet [name] Other Clinician: Referring Provider: Treating Provider/Extender: [name] in Treatment: 87 Diagnosis Coding ICD-10 Codes Code Description [phone] Pressure ulcer of left buttock, stage 3 Facility Procedures : CPT4 Code: [id number] Description: Telehealth originating site facility fee. Modifier: Quantity: 1 Physician Procedures : CPT4 Code Description Modifier [id number] - WC PHYS LEVEL 4 - EST PT ICD-10 Diagnosis Description L89.323 Pressure ulcer of left buttock, stage 3 Quantity: 1 Electronic Signature(s) Signed: 07/17/2022 12:13:12 PM By: Duanne Guess MD FACS Entered By: Duanne Guess on 07/17/2022 12:13:12

## 2022-08-21 ENCOUNTER — Encounter (HOSPITAL_BASED_OUTPATIENT_CLINIC_OR_DEPARTMENT_OTHER): Payer: Medicare Other | Attending: General Surgery | Admitting: General Surgery

## 2022-08-21 DIAGNOSIS — L89323 Pressure ulcer of left buttock, stage 3: Secondary | ICD-10-CM | POA: Diagnosis present

## 2022-08-21 DIAGNOSIS — I119 Hypertensive heart disease without heart failure: Secondary | ICD-10-CM | POA: Insufficient documentation

## 2022-08-21 DIAGNOSIS — Z7901 Long term (current) use of anticoagulants: Secondary | ICD-10-CM | POA: Insufficient documentation

## 2022-08-21 DIAGNOSIS — Z86718 Personal history of other venous thrombosis and embolism: Secondary | ICD-10-CM | POA: Insufficient documentation

## 2022-08-21 DIAGNOSIS — Z95828 Presence of other vascular implants and grafts: Secondary | ICD-10-CM | POA: Insufficient documentation

## 2022-08-21 DIAGNOSIS — I43 Cardiomyopathy in diseases classified elsewhere: Secondary | ICD-10-CM | POA: Insufficient documentation

## 2022-08-21 NOTE — Progress Notes (Addendum)
Padia, Collier [name] ([id number]) [id number] Page 1 of 4 Visit Report for 08/21/2022 Allergy List Details Patient Name: Date of Service: [name], [name] 08/21/2022 11:00 A M Medical Record Number: [id number] Patient Account Number: [id number] Date of Birth/Sex: Treating RN: February 14, 1941 (82 y.o. Marta Lamas Primary Care Gael Delude: Nolene Ebbs Other Clinician: Referring Wandra Babin: Treating Ashawn Rinehart/Extender: Consuelo Pandy in Treatment: 95 Allergies Active Allergies codeine alteplase Allergy Notes Electronic Signature(s) Signed: 08/21/2022 11:07:47 AM By: Blanche East RN Entered By: Blanche East on 08/21/2022 11:07:47 -------------------------------------------------------------------------------- Pain Assessment Details Patient Name: Date of Service: [name] 08/21/2022 11:00 A M Medical Record Number: [id number] Patient Account Number: [id number] Date of Birth/Sex: Treating RN: Nov 06, 1940 (82 y.o. Marta Lamas Primary Care Azari Hasler: Nolene Ebbs Other Clinician: Referring Keiondre Colee: Treating Marybella Ethier/Extender: Consuelo Pandy in Treatment: 5 Active Problems Location of Pain Severity and Description of Pain Patient Has Paino No Site Locations Rate the pain. Current Pain Level: 0 Pain Management and Medication Current Pain Management: Gennie, Eisinger Collier [name] ([id number]) [id number] Page 2 of 4 Electronic Signature(s) Signed: 08/21/2022 11:22:34 AM By: Blanche East RN Entered By: Blanche East on 08/21/2022 11:22:34 -------------------------------------------------------------------------------- Patient/Caregiver Education Details Patient Name: Date of Service: [name] 1/10/2024andnbsp11:00 [place] Record Number: [id number] Patient Account Number: [id number] Date of Birth/Gender: Treating RN: July 31, 1941 (82 y.o. Marta Lamas Primary Care  Physician: Nolene Ebbs Other Clinician: Referring Physician: Treating Physician/Extender: Consuelo Pandy in Treatment: 27 Education Assessment Education Provided To: Patient Education Topics Provided Offloading: Methods: Explain/Verbal Responses: Reinforcements needed, State content correctly Wound/Skin Impairment: Methods: Explain/Verbal Responses: Reinforcements needed, State content correctly Electronic Signature(s) Signed: 08/23/2022 4:39:48 PM By: Blanche East RN Entered By: Blanche East on 08/21/2022 11:19:13 -------------------------------------------------------------------------------- Wound Assessment Details Patient Name: Date of Service: Sarah Seltzer D. 08/21/2022 11:00 A M Medical Record Number: [id number] Patient Account Number: [id number] Date of Birth/Sex: Treating RN: December 05, 1940 (82 y.o. Marta Lamas Primary Care Johnn Krasowski: Nolene Ebbs Other Clinician: Referring Jamyiah Labella: Treating Emory Gallentine/Extender: Consuelo Pandy in Treatment: 21 Wound Status Wound Number: 5 Primary Etiology: Pressure Ulcer Wound Location: Left Gluteus Wound Status: Open Wounding Event: Gradually Appeared Comorbid History: Hypertension, Dementia Date Acquired: 09/12/2020 Weeks Of Treatment: 92 Clustered Wound: No Photos [name], Collier [name] ([id number]) [id number] Page 3 of 4 Wound Measurements Length: (cm) 2 Width: (cm) 1 Depth: (cm) 0.1 Area: (cm) 1.571 Volume: (cm) 0.157 % Reduction in Area: 33.3% % Reduction in Volume: 93.3% Epithelialization: Small (1-33%) Tunneling: Yes Position (o'clock): 6 Maximum Distance: (cm) 0.8 Undermining: No Wound Description Classification: Category/Stage III Wound Margin: Well defined, not attached Exudate Amount: Medium Exudate Type: Serosanguineous Exudate Color: red, brown Foul Odor After Cleansing: No Slough/Fibrino Yes Wound Bed Granulation Amount:  Large (67-100%) Exposed Structure Granulation Quality: Red Fascia Exposed: No Necrotic Amount: Small (1-33%) Fat Layer (Subcutaneous Tissue) Exposed: Yes Tendon Exposed: No Muscle Exposed: No Joint Exposed: No Bone Exposed: No Periwound Skin Texture Texture Color No Abnormalities Noted: Yes No Abnormalities Noted: Yes Moisture Temperature / Pain No Abnormalities Noted: Yes Temperature: No Abnormality Electronic Signature(s) Signed: 08/23/2022 4:39:48 PM By: Blanche East RN Previous Signature: 08/21/2022 11:07:22 AM Version By: Blanche East RN Entered By: Blanche East on 08/21/2022 12:19:26 [name] ([id number]) [id number] Page 4 of 4 -------------------------------------------------------------------------------- Vitals Details Patient Name: Date of Service: [name] 08/21/2022 11:00 A M Medical Record Number: [id number] Patient Account Number: [id number] Date of Birth/Sex: Treating RN: February 07, 1941 (82 y.o. Marta Lamas Primary Care Kamariya Blevens:  Nolene Ebbs Other Clinician: Referring Shalane Florendo: Treating Halei Hanover/Extender: Consuelo Pandy in Treatment: 40 Vital Signs Time Taken: 11:00 Temperature (F): 98.1 Height (in): 62 Pulse (bpm): 64 Blood Pressure (mmHg): 138/66 Reference Range: 80 - 120 mg / dl Airway Pulse Oximetry (%): 94 Electronic Signature(s) Signed: 08/21/2022 11:20:43 AM By: Blanche East RN Previous Signature: 08/21/2022 11:00:25 AM Version By: Blanche East RN Entered By: Blanche East on 08/21/2022 11:20:43

## 2022-08-24 NOTE — Progress Notes (Signed)
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 wound with soap and water. Off-Loading: Low air-loss mattress (Group 2) Turn and reposition every 2 hours Additional Orders / Instructions: Follow Nutritious Diet - Continue Juven and Nutrin Home Health: No change in wound care orders this  week; continue Home Health for wound care. May utilize formulary equivalent dressing for wound treatment orders unless otherwise specified. Other Home Health Orders/Instructions: - [facility] for Wound Care 3x week. [place] [phone] WOUND #5: - Gluteus Wound Laterality: Left Cleanser: Wound Cleanser (Home Health) 3 x Per Week/30 Days Discharge Instructions: Cleanse the wound with wound cleanser prior to applying a clean dressing using gauze sponges, not tissue or cotton balls. Prim Dressing: KerraCel Ag Gelling Fiber Dressing, 2x2 in (silver alginate) 3 x Per Week/30 Days ary Discharge Instructions: Apply silver alginate on top of wound Secondary Dressing: Zetuvit Plus Silicone Border Dressing 4x4 (in/in) (Home Health) 3 x Per Week/30 Days Discharge Instructions: Apply silicone border or abd pad with tape over primary dressing as directed. 08/21/2022: Wound evaluation based upon photos provided by the home health nurse and description provided by same. The ulcer on the patient's buttock has contracted considerably and is much shallower. The periwound purple discoloration has also decreased markedly. Minimal drainage reported from the wound. Continue to pack the wound with silver alginate. Continue turning and offloading. Monitor protein intake for wound healing. We will have another telehealth visit in 1 month's time. Electronic Signature(s) Signed: 08/21/2022 11:48:38 AM By: Duanne Guess MD FACS Entered By: Duanne Guess on 08/21/2022 11:48:37 -------------------------------------------------------------------------------- HxROS Details Patient Name: Date of Service: Sarah Hose D. 08/21/2022 11:00 A M Medical Record Number: [id number] Patient Account Number: [id number] EMBERLYNN, GEVEDON ([id number]) [phone].pdf Page 9 of 10 Date of Birth/Sex: Treating RN: 1940/11/14 (82 y.o. Kateri Mc Primary Care Provider: Other Clinician: [name] Referring Provider: Treating Provider/Extender: Bud Face in Treatment: 68 Unable to Obtain Patient History due to Dementia Information Obtained From Patient Eyes Medical History: Negative for: Cataracts; Glaucoma; Optic Neuritis Ear/Nose/Mouth/Throat Medical History: Negative for: Chronic sinus problems/congestion; Middle ear problems Hematologic/Lymphatic Medical History: Negative for: Anemia; Hemophilia; Human Immunodeficiency Virus; Lymphedema; Sickle Cell Disease Respiratory Medical History: Negative for: Aspiration; Asthma; Chronic Obstructive Pulmonary Disease (COPD); Pneumothorax; Sleep Apnea; Tuberculosis Cardiovascular Medical History: Positive for: Hypertension Negative for: Angina; Arrhythmia; Congestive Heart Failure; Coronary Artery Disease; Deep Vein Thrombosis; Hypotension; Myocardial Infarction; Peripheral Arterial Disease; Peripheral Venous Disease; Phlebitis; Vasculitis Gastrointestinal Medical History: Negative for: Cirrhosis ; Colitis; Crohns; Hepatitis A; Hepatitis B; Hepatitis C Endocrine Medical History: Negative for: Type I Diabetes; Type II Diabetes Genitourinary Medical History: Negative for: End Stage Renal Disease  Immunological Medical History: Negative for: Lupus Erythematosus; Raynauds; Scleroderma Integumentary (Skin) Medical History: Negative for: History of Burn Musculoskeletal Medical History: Negative for: Gout; Rheumatoid Arthritis; Osteoarthritis; Osteomyelitis Neurologic Medical History: Positive for: Dementia Negative for: Neuropathy; Quadriplegia; Paraplegia; Seizure Disorder Oncologic Medical History: Negative for: Received Chemotherapy; Received Radiation [name] [name], [name] [name] ([id number]) [id number] Page 10 of 10 Psychiatric Medical History: Negative for: Anorexia/bulimia; Confinement Anxiety Immunizations Pneumococcal Vaccine: Received Pneumococcal  Vaccination: No Implantable Devices None Hospitalization / Surgery History Type of Hospitalization/Surgery OR debridement/ metal plate removal 40/98 Family and Social History Cancer: Yes - Siblings; Diabetes: No; Heart Disease: No; Hereditary Spherocytosis: No; Hypertension: No; Kidney Disease: No; Lung Disease: Yes - Siblings; Seizures: No; Stroke: No; Thyroid Problems: No; Tuberculosis: No; Former smoker; Marital Status - Widowed; Alcohol Use: Never; Drug Use: No History; Caffeine Use: Daily; Financial Concerns: No; Food, Clothing or Shelter Needs: No; Support System Lacking: No; Transportation Concerns: No Electronic Signature(s) Signed: 08/21/2022 11:49:27 AM By: Duanne Guess MD FACS Signed: 08/23/2022 4:39:48 PM By: Tommie Ard RN Entered By: Tommie Ard on 08/21/2022 11:07:55 -------------------------------------------------------------------------------- SuperBill Details Patient Name: Date of Service: [name] 08/21/2022 Medical Record Number: [id number] Patient Account Number: [id number] Date of Birth/Sex: Treating RN: 1941-04-13 (82 y.o. Kateri Mc Primary Care Provider: [name] Other Clinician: Referring Provider: Treating Provider/Extender: Bud Face in Treatment: 92 Diagnosis Coding ICD-10 Codes Code Description [phone] Pressure ulcer of left buttock, stage 3 Facility Procedures : CPT4 Code: [id number] Description: Telehealth originating site facility fee. Modifier: Quantity: 1 Physician Procedures : CPT4 Code Description Modifier [id number] - WC PHYS LEVEL 4 - EST PT ICD-10 Diagnosis Description L89.323 Pressure ulcer of left buttock, stage 3 Quantity: 1 Electronic Signature(s) Signed: 08/21/2022 11:48:51 AM By: Duanne Guess MD FACS Previous Signature: 08/21/2022 11:26:05 AM Version By: Tommie Ard RN Previous Signature: 08/21/2022 11:24:35 AM Version By: Tommie Ard RN Entered By: Duanne Guess  on 08/21/2022 11:48:51

## 2022-09-18 ENCOUNTER — Encounter (HOSPITAL_BASED_OUTPATIENT_CLINIC_OR_DEPARTMENT_OTHER): Payer: Medicare Other | Attending: General Surgery | Admitting: General Surgery

## 2022-09-18 DIAGNOSIS — Z86718 Personal history of other venous thrombosis and embolism: Secondary | ICD-10-CM | POA: Diagnosis not present

## 2022-09-18 DIAGNOSIS — Z7901 Long term (current) use of anticoagulants: Secondary | ICD-10-CM | POA: Diagnosis not present

## 2022-09-18 DIAGNOSIS — L89323 Pressure ulcer of left buttock, stage 3: Secondary | ICD-10-CM | POA: Insufficient documentation

## 2022-09-18 DIAGNOSIS — I428 Other cardiomyopathies: Secondary | ICD-10-CM | POA: Insufficient documentation

## 2022-09-18 DIAGNOSIS — F0150 Vascular dementia without behavioral disturbance: Secondary | ICD-10-CM | POA: Diagnosis not present

## 2022-09-18 DIAGNOSIS — I10 Essential (primary) hypertension: Secondary | ICD-10-CM | POA: Insufficient documentation

## 2022-09-18 DIAGNOSIS — Z86711 Personal history of pulmonary embolism: Secondary | ICD-10-CM | POA: Diagnosis not present

## 2022-09-18 DIAGNOSIS — Z87891 Personal history of nicotine dependence: Secondary | ICD-10-CM | POA: Diagnosis not present

## 2022-09-18 NOTE — Progress Notes (Signed)
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 noted. The wound margin is well defined and not attached to the wound base. There is large (67-100%) red granulation within the wound bed. There is a small (1-33%) amount of necrotic tissue within the wound bed including Adherent Slough. The periwound skin appearance had no abnormalities noted for texture. The  periwound skin appearance had no abnormalities noted for moisture. The periwound skin appearance had no abnormalities noted for color. Periwound temperature was noted as No Abnormality. Assessment Active Problems ICD-10 Pressure ulcer of left buttock, stage 3 Plan Follow-up Appointments: Return appointment in 1 month. - Telehealth Wednesday 10/16/22 @ 10:30 am Bathing/ Shower/ Hygiene: May shower and wash wound with soap and water. Off-Loading: Low air-loss mattress (Group 2) Turn and reposition every 2 hours Additional Orders / Instructions: Follow Nutritious Diet - Continue Juven and Nutrin Home Health: No change in wound care orders this week; continue Home Health for wound care. May utilize formulary equivalent dressing for wound treatment orders unless otherwise specified. Other Home Health Orders/Instructions: - [facility] for Wound Care 3x week. [facility] DecSara [phone] WOUND #5: - Gluteus Wound Laterality: Left Cleanser: Wound Cleanser (Home Health) 3 x Per Week/30 Days Discharge Instructions: Cleanse the wound with wound cleanser prior to applying a clean dressing using gauze sponges, not tissue or cotton balls. Prim Dressing: KerraCel Ag Gelling Fiber Dressing, 2x2 in (silver alginate) 3 x Per Week/30 Days ary Discharge Instructions: Apply silver alginate on top of wound Secondary Dressing: Zetuvit Plus Silicone Border Dressing 4x4 (in/in) (Home Health) 3 x Per Week/30 Days Discharge Instructions: Apply silicone border or abd pad with tape over primary dressing as directed. 09/18/2022: T elehealth visit with the patient via her home health nurse Sarah. The case manager and I participated in the call from our office in the wound care center. The patient and her nurse were at the patient's home. Photos were provided by Maralyn SagoSarah. According to Maralyn SagoSarah, the buttock ulcer continues to improve. Photographs provided by her corroborate this description. She says that the drainage is thinner  and less copious. We will continue to pack the wound with silver alginate. 0 will use Skin-Prep around the wound to address the issue of skin irritation from tape. We will have a telehealth visit in 1 month for ongoing management of this wound. Electronic Signature(s) Unsigned Previous Signature: 09/18/2022 11:52:37 AM Version By: Duanne Guessannon, Lempi Edwin MD FACS Entered By: Zenaida DeedBoehlein, Linda on 09/18/2022 13:10:55 Signature(s): Raisch, Sarah GraveWILLIEN [name] ([id number]) [id number] Date(s): [phone] Page  9 of 11 -------------------------------------------------------------------------------- HxROS Details Patient Name: Date of Service: [name], [name] 09/18/2022 10:30 A M Medical Record Number: [id number] Patient Account Number: [id number] Date of Birth/Sex: Treating RN: 06-Sep-1940 (82 y.o. [name] Primary Care Provider: [name] Other Clinician: Referring Provider: Treating Provider/Extender: Bud Face in Treatment: 51 Unable to Obtain Patient History due to Dementia Information Obtained From Patient Eyes Medical History: Negative for: Cataracts; Glaucoma; Optic Neuritis Ear/Nose/Mouth/Throat Medical History: Negative for: Chronic sinus problems/congestion; Middle ear problems Hematologic/Lymphatic Medical History: Negative for: Anemia; Hemophilia; Human Immunodeficiency Virus; Lymphedema; Sickle Cell Disease Respiratory Medical History: Negative for: Aspiration; Asthma; Chronic Obstructive Pulmonary Disease (COPD); Pneumothorax; Sleep Apnea; Tuberculosis Cardiovascular Medical History: Positive for: Hypertension Negative for: Angina; Arrhythmia; Congestive Heart Failure; Coronary Artery Disease; Deep Vein Thrombosis; Hypotension; Myocardial Infarction; Peripheral Arterial Disease; Peripheral Venous Disease; Phlebitis; Vasculitis Gastrointestinal Medical History: Negative for: Cirrhosis ; Colitis; Crohns; Hepatitis A; Hepatitis B;  Hepatitis C Endocrine Medical History: Negative for: Type I Diabetes; Type II Diabetes Genitourinary Medical History: Negative for: End Stage Renal Disease Immunological Medical History: Negative for: Lupus Erythematosus; Raynauds; Scleroderma Integumentary (Skin) Medical History: Negative for: History of Burn Musculoskeletal Medical History: Negative for: Gout; Rheumatoid Arthritis; Osteoarthritis; Osteomyelitis Camacho, [name] ([id number]) [phone].pdf Page 10 of 11 Neurologic Medical History: Positive for: Dementia Negative for: Neuropathy; Quadriplegia; Paraplegia; Seizure Disorder Oncologic Medical History: Negative for: Received Chemotherapy; Received Radiation Psychiatric Medical History: Negative for: Anorexia/bulimia; Confinement Anxiety Immunizations Pneumococcal Vaccine: Received Pneumococcal Vaccination: No Implantable Devices None Hospitalization / Surgery History Type of Hospitalization/Surgery OR debridement/ metal plate removal 02/72 Family and Social History Cancer: Yes - Siblings; Diabetes: No; Heart Disease: No; Hereditary Spherocytosis: No; Hypertension: No; Kidney Disease: No; Lung Disease: Yes - Siblings; Seizures: No; Stroke: No; Thyroid Problems: No; Tuberculosis: No; Former smoker; Marital Status - Widowed; Alcohol Use: Never; Drug Use: No History; Caffeine Use: Daily; Financial Concerns: No; Food, Clothing or Shelter Needs: No; Support System Lacking: No; Transportation Concerns: No Electronic Signature(s) Unsigned Entered By: Zenaida Deed on 09/18/2022 13:09:39 -------------------------------------------------------------------------------- SuperBill Details Patient Name: Date of Service: JNIYAH, DANTUONO 09/18/2022 Medical Record Number: [id number] Patient Account Number: [id number] Date of Birth/Sex: Treating RN: 06-03-1941 (82 y.o. F) Primary Care Provider: [name] Other Clinician: Referring  Provider: Treating Provider/Extender: Bud Face in Treatment: 96 Diagnosis Coding ICD-10 Codes Code Description [id number] Pressure ulcer of left buttock, stage 3 Facility Procedures : CPT4 Code: [id number] Description: Telehealth originating site facility fee. Modifier: Quantity: 1 Physician Procedures : CPT4 Code Description Modifier [id number] - WC PHYS LEVEL 4 - EST PT ICD-10 Diagnosis Description L89.323 Pressure ulcer of left buttock, stage 3 Caradonna, [name] ([id number]) [phone].pd Quantity: 1 f Page 11 of 11 Electronic Signature(s) Unsigned Previous Signature: 09/18/2022 11:52:46 AM Version By: Duanne Guess MD FACS Entered By: Zenaida Deed on 09/18/2022 13:13:13 Signature(s): Date(s):

## 2022-09-19 NOTE — Progress Notes (Signed)
Babington, [name] ([id number]) [phone].pdf Page 1 of 3 Visit Report for 09/18/2022 Allergy List Details Patient Name: Date of Service: [name], [name] 09/18/2022 10:30 A M Medical Record Number: [id number] Patient Account Number: [id number] Date of Birth/Sex: Treating RN: 10-Apr-1941 (82 y.o. [name] Primary Care Kohl Polinsky: Nolene Ebbs Other Clinician: Referring Shadiyah Wernli: Treating Yitzchok Carriger/Extender: Consuelo Pandy in Treatment: 25 Allergies Active Allergies codeine alteplase Allergy Notes Electronic Signature(s) Signed: 09/18/2022 5:56:04 PM By: Baruch Gouty RN, BSN Entered By: Baruch Gouty on 09/18/2022 13:09:34 -------------------------------------------------------------------------------- Patient/Caregiver Education Details Patient Name: Date of Service: [name] 2/7/2024andnbsp10:30 A M Medical Record Number: [id number] Patient Account Number: [id number] Date of Birth/Gender: Treating RN: Apr 06, 1941 (82 y.o. [name] Primary Care Physician: Nolene Ebbs Other Clinician: Referring Physician: Treating Physician/Extender: Consuelo Pandy in Treatment: 8 Education Assessment Education Provided To: Patient Education Topics Provided Wound/Skin Impairment: Methods: Explain/Verbal Responses: Reinforcements needed, State content correctly [employer]) Signed: 09/18/2022 5:56:04 PM By: Baruch Gouty RN, BSN Entered By: Baruch Gouty on 09/18/2022 14:21:35 Certain, [name] ([id number]) [phone].pdf Page 2 of 3 -------------------------------------------------------------------------------- Wound Assessment Details Patient Name: Date of Service: [name], [name] 09/18/2022 10:30 A M Medical Record Number: [id number] Patient Account Number: [id number] Date of Birth/Sex: Treating RN: 1940/12/16 (82 y.o. [name] Primary Care  Karon Heckendorn: Nolene Ebbs Other Clinician: Referring Eustolia Drennen: Treating Tylik Treese/Extender: Consuelo Pandy in Treatment: 96 Wound Status Wound Number: 5 Primary Etiology: Pressure Ulcer Wound Location: Left Gluteus Wound Status: Open Wounding Event: Gradually Appeared Comorbid History: Hypertension, Dementia Date Acquired: 09/12/2020 Weeks Of Treatment: 96 Clustered Wound: No Photos Wound Measurements Length: (cm) 2 Width: (cm) 0.5 Depth: (cm) 0.5 Area: (cm) 0.785 Volume: (cm) 0.393 % Reduction in Area: 66.7% % Reduction in Volume: 83.3% Epithelialization: Small (1-33%) Tunneling: Yes Position (o'clock): 6 Maximum Distance: (cm) 0.5 Undermining: No Wound Description Classification: Category/Stage III Wound Margin: Well defined, not attached Exudate Amount: Medium Exudate Type: Serosanguineous Exudate Color: red, brown Foul Odor After Cleansing: No Slough/Fibrino Yes Wound Bed Granulation Amount: Large (67-100%) Exposed Structure Granulation Quality: Red Fascia Exposed: No Necrotic Amount: Small (1-33%) Fat Layer (Subcutaneous Tissue) Exposed: Yes Boehm, [name] ([id number]) [id number] Page 3 of 3 Necrotic Quality: Adherent Slough Tendon Exposed: No Muscle Exposed: No Joint Exposed: No Bone Exposed: No Periwound Skin Texture Texture Color No Abnormalities Noted: Yes No Abnormalities Noted: Yes Moisture Temperature / Pain No Abnormalities Noted: Yes Temperature: No Abnormality Electronic Signature(s) Signed: 09/18/2022 5:56:04 PM By: Baruch Gouty RN, BSN Entered By: Baruch Gouty on 09/18/2022 17:21:58 -------------------------------------------------------------------------------- [place] Details Patient Name: Date of Service: Sarah Seltzer D. 09/18/2022 10:30 A M Medical Record Number: [id number] Patient Account Number: [id number] Date of Birth/Sex: Treating RN: 05-Apr-1941 (82 y.o. [name] Primary Care Jebediah Macrae: Nolene Ebbs Other Clinician: Referring Chrishonda Hesch: Treating Indio Santilli/Extender: Consuelo Pandy in Treatment: 96 Vital Signs Time Taken: 10:55 Temperature (F): 97.7 Height (in): 62 Pulse (bpm): 83 Blood Pressure (mmHg): 122/66 Reference Range: 80 - 120 mg / dl Airway Pulse Oximetry (%): 96 Electronic Signature(s) Signed: 09/18/2022 5:56:04 PM By: Baruch Gouty RN, BSN Entered By: Baruch Gouty on 09/18/2022 13:08:31

## 2022-10-16 ENCOUNTER — Encounter (HOSPITAL_BASED_OUTPATIENT_CLINIC_OR_DEPARTMENT_OTHER): Payer: Medicare Other | Attending: General Surgery | Admitting: General Surgery

## 2022-10-16 DIAGNOSIS — L89323 Pressure ulcer of left buttock, stage 3: Secondary | ICD-10-CM | POA: Insufficient documentation

## 2022-10-16 DIAGNOSIS — Z86718 Personal history of other venous thrombosis and embolism: Secondary | ICD-10-CM | POA: Insufficient documentation

## 2022-10-16 DIAGNOSIS — I428 Other cardiomyopathies: Secondary | ICD-10-CM | POA: Diagnosis not present

## 2022-10-16 DIAGNOSIS — Z7901 Long term (current) use of anticoagulants: Secondary | ICD-10-CM | POA: Diagnosis not present

## 2022-10-16 DIAGNOSIS — I10 Essential (primary) hypertension: Secondary | ICD-10-CM | POA: Insufficient documentation

## 2022-10-16 DIAGNOSIS — F0150 Vascular dementia without behavioral disturbance: Secondary | ICD-10-CM | POA: Diagnosis not present

## 2022-10-18 NOTE — Progress Notes (Signed)
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 Paraplegia, Seizure Disorder Oncologic Denies history of Received Chemotherapy, Received Radiation Psychiatric Denies history of Anorexia/bulimia, Confinement Anxiety Hospitalization/Surgery History - OR debridement/ metal plate removal  579FGE. Objective Constitutional Vitals Time Taken: 10:56 AM, Temperature: 97.8 F, Pulse: 74 bpm, Respiratory Rate: 18 breaths/min, Blood Pressure: 132/68 mmHg, Pulse Oximetry: 96 %. General Notes: 10/16/2022: Wound evaluation based upon photos provided by the home health nurse and description given by same: The buttock ulcer is shallower, but there is a "squishy area" adjacent that Judson Roch concerned may break open. Judson Roch reports that the drainage is looking a little bit more purulent. The [name], [name] ([id number]) [phone].pdf Page 8 of 10 purple marks that were present last time are no longer there. Integumentary (Hair, Skin) Wound #5 status is Open. Original cause of wound was Gradually Appeared. The date acquired was: 09/12/2020. The wound has been in treatment 100 weeks. The wound is located on the Left Gluteus. The wound measures 0.1cm length x 0.3cm width x 0.1cm depth; 0.024cm^2 area and 0.002cm^3 volume. There is Fat Layer (Subcutaneous Tissue) exposed. There is no undermining noted, however, there is tunneling at 6:00 with a maximum distance of 1cm. There is a small amount of sanguinous drainage noted. The wound margin is well defined and not attached to the wound base. There is small (1-33%) granulation within the wound bed. There is no necrotic tissue within the wound bed. The periwound skin appearance had no abnormalities noted for texture. The periwound skin appearance had no abnormalities noted for moisture. The periwound skin appearance had no abnormalities noted for color. Periwound temperature was noted as No Abnormality. Assessment Active Problems ICD-10 Pressure ulcer of left buttock, stage 3 Plan Follow-up Appointments: Return appointment in 1 month. - Telehealth Wednesday 11/13/22 @ 10:30 am Bathing/ Shower/ Hygiene: May shower and wash wound with soap and water. Off-Loading: Low air-loss mattress (Group 2) Turn and reposition every 2  hours Additional Orders / Instructions: Follow Nutritious Diet - Continue Juven and Nutrin Home Health: New wound care orders this week; continue Home Health for wound care. May utilize formulary equivalent dressing for wound treatment orders unless otherwise specified. Other Home Health Orders/Instructions: - [place] for Wound Care 3x week. Clarise Cruz [phone] WOUND #5: -  Gluteus Wound Laterality: Left Cleanser: Wound Cleanser (Home Health) 3 x Per Week/30 Days Discharge Instructions: Cleanse the wound with wound cleanser prior to applying a clean dressing using gauze sponges, not tissue or cotton balls. Topical: Gentamicin 3 x Per Week/30 Days Discharge Instructions: thin layer to wound bed Prim Dressing: KerraCel Ag Gelling Fiber Dressing, 2x2 in (silver alginate) 3 x Per Week/30 Days ary Discharge Instructions: Apply silver alginate on top of wound Secondary Dressing: Zetuvit Plus Silicone Border Dressing 4x4 (in/in) ([place]) 3 x Per Week/30 Days Discharge Instructions: Apply silicone border or abd pad with tape over primary dressing as directed. 10/16/2022: The buttock ulcer is shallower, but there is a "squishy area" adjacent that Judson Roch concerned may break open. Judson Roch reports that the drainage is looking a little bit more purulent. The purple marks that were present last time are no longer there. We will continue to pack the site with silver alginate, but we will reinstitute topical gentamicin. They should have an active prescription, but they will contact us if they need a new one. Follow-up visit via telehealth in 1 month. Electronic Signature(s) Signed: 10/18/2022 2:25:58 PM By: Fredirick Maudlin MD FACS Signed: 10/18/2022 3:03:33 PM By: Deon Pilling RN, BSN Previous Signature: 10/16/2022 12:15:00 PM Version By: Fredirick Maudlin MD FACS Entered By: Deon Pilling on 10/18/2022 13:43:04 -------------------------------------------------------------------------------- HxROS  Details Patient Name: Date of Service: Sarah Seltzer D. 10/16/2022 10:30 A M Medical Record Number: [id number] Patient Account Number: [id number] Date of Birth/Sex: Treating RN: 10-19-1940 (82 y.o. [name] Primary Care Provider: Nolene Ebbs Other Clinician: Referring Provider: Treating Provider/Extender: Consuelo Pandy in Treatment: 100 Unable to Obtain Patient History due to Dementia [name], [name] ([id number]) [phone].pdf Page 9 of 10 Information Obtained From Patient Eyes Medical History: Negative for: Cataracts; Glaucoma; Optic Neuritis Ear/Nose/Mouth/Throat Medical History: Negative for: Chronic sinus problems/congestion; Middle ear problems Hematologic/Lymphatic Medical History: Negative for: Anemia; Hemophilia; Human Immunodeficiency Virus; Lymphedema; Sickle Cell Disease Respiratory Medical History: Negative for: Aspiration; Asthma; Chronic Obstructive Pulmonary Disease (COPD); Pneumothorax; Sleep Apnea; Tuberculosis Cardiovascular Medical History: Positive for: Hypertension Negative for: Angina; Arrhythmia; Congestive Heart Failure; Coronary Artery Disease; Deep Vein Thrombosis; Hypotension; Myocardial Infarction; Peripheral Arterial Disease; Peripheral Venous Disease; Phlebitis; Vasculitis Gastrointestinal Medical History: Negative for: Cirrhosis ; Colitis; Crohns; Hepatitis A; Hepatitis B; Hepatitis C Endocrine Medical History: Negative for: Type I Diabetes; Type II Diabetes Genitourinary Medical History: Negative for: End Stage Renal Disease Immunological Medical History: Negative for: Lupus Erythematosus; Raynauds; Scleroderma Integumentary (Skin) Medical History: Negative for: History of Burn Musculoskeletal Medical History: Negative for: Gout; Rheumatoid Arthritis; Osteoarthritis; Osteomyelitis Neurologic Medical History: Positive for: Dementia Negative for: Neuropathy; Quadriplegia;  Paraplegia; Seizure Disorder Oncologic Medical History: Negative for: Received Chemotherapy; Received Radiation Psychiatric Medical History: Negative for: Anorexia/bulimia; Confinement Anxiety Immunizations Pneumococcal Vaccine: [name], [name] ([id number]) [phone].pdf Page 10 of 10 Received Pneumococcal Vaccination: No Implantable Devices None Hospitalization / Surgery History Type of Hospitalization/Surgery OR debridement/ metal plate removal [id number] Family and Social History Cancer: Yes - Siblings; Diabetes: No; Heart Disease: No; Hereditary Spherocytosis: No; Hypertension: No; Kidney Disease: No; Lung Disease: Yes - Siblings; Seizures: No; Stroke: No; Thyroid Problems: No; Tuberculosis: No; Former smoker; Marital Status - Widowed; Alcohol Use: Never; Drug Use: No History; Caffeine Use: Daily; Financial Concerns: No; Food, Clothing or Shelter Needs: No; Support System Lacking: No; Transportation Concerns: No Engineer, maintenance) Signed: 10/16/2022 12:55:37 PM By: Fredirick Maudlin MD FACS Signed: 10/16/2022 5:28:33 PM By: Baruch Gouty RN, BSN Entered By: Baruch Gouty  on 10/16/2022 10:58:22 -------------------------------------------------------------------------------- SuperBill Details Patient Name: Date of Service: [name], CRINCOLI 10/16/2022 Medical Record Number: [id number] Patient Account Number: [id number] Date of Birth/Sex: Treating RN: 08/19/1940 (82 y.o. [name] Primary Care Provider: Nolene Ebbs Other Clinician: Referring Provider: Treating Provider/Extender: Consuelo Pandy in Treatment: 100 Diagnosis Coding ICD-10 Codes Code Description [phone] Pressure ulcer of left buttock, stage 3 Facility Procedures : CPT4 Code: [id number] Description: Telehealth originating site facility fee. Modifier: Quantity: 1 Physician Procedures : CPT4 Code Description Modifier [id number] - WC PHYS LEVEL 4 - EST  PT ICD-10 Diagnosis Description L89.323 Pressure ulcer of left buttock, stage 3 Quantity: 1 Electronic Signature(s) Signed: 10/16/2022 12:15:30 PM By: Fredirick Maudlin MD FACS Entered By: Fredirick Maudlin on 10/16/2022 12:15:30

## 2022-10-18 NOTE — Progress Notes (Signed)
Nordin, [name] ([id number]) [phone].pdf Page 1 of 3 Visit Report for 10/16/2022 Allergy List Details Patient Name: Date of Service: [name], [name] 10/16/2022 10:30 A M Medical Record Number: [id number] Patient Account Number: [id number] Date of Birth/Sex: Treating RN: 02/07/1941 (82 y.o. [name] Primary Care Rad Gramling: Nolene Ebbs Other Clinician: Referring Tametra Ahart: Treating Alegra Rost/Extender: Consuelo Pandy in Treatment: 100 Allergies Active Allergies codeine alteplase Allergy Notes Electronic Signature(s) Signed: 10/16/2022 5:28:33 PM By: Baruch Gouty RN, BSN Entered By: Baruch Gouty on 10/16/2022 10:55:29 -------------------------------------------------------------------------------- Patient/Caregiver Education Details Patient Name: Date of Service: [name] 3/6/2024andnbsp10:30 A M Medical Record Number: [id number] Patient Account Number: [id number] Date of Birth/Gender: Treating RN: 05-17-1941 (82 y.o. [name] Primary Care Physician: Nolene Ebbs Other Clinician: Referring Physician: Treating Physician/Extender: Consuelo Pandy in Treatment: 100 Education Assessment Education Provided To: Caregiver Education Topics Provided Wound/Skin Impairment: Methods: Explain/Verbal Responses: Reinforcements needed, State content correctly [employer]) Signed: 10/16/2022 5:28:33 PM By: Baruch Gouty RN, BSN Entered By: Baruch Gouty on 10/16/2022 12:05:01 -------------------------------------------------------------------------------- Wound Assessment Details Patient Name: Date of Service: Sarah Seltzer D. 10/16/2022 10:30 A M Medical Record Number: [id number] Patient Account Number: [id number] Date of Birth/Sex: Treating RN: [birth year] (82 y.o. [name] Primary Care Shonia Skilling: Nolene Ebbs Other Clinician: Referring Phineas Mcenroe: Treating  Romaine Maciolek/Extender: Consuelo Pandy in Treatment: 100 Wound Status Wound Number: 5 Primary Etiology: Pressure Ulcer Wound Location: Left Gluteus Wound Status: Open Wounding Event: Gradually Appeared Comorbid History: Hypertension, Dementia Kook, [name] ([id number]) [id number].pdf Page 2 of 3 Date Acquired: 09/12/2020 Weeks Of Treatment: 100 Clustered Wound: No Photos Wound Measurements Length: (cm) 0.1 Width: (cm) 0.3 Depth: (cm) 0.1 Area: (cm) 0.024 Volume: (cm) 0.002 % Reduction in Area: 99% % Reduction in Volume: 99.9% Epithelialization: Small (1-33%) Tunneling: Yes Position (o'clock): 6 Maximum Distance: (cm) 1 Undermining: No Wound Description Classification: Category/Stage III Wound Margin: Well defined, not attached Exudate Amount: Small Exudate Type: Sanguinous Exudate Color: red Foul Odor After Cleansing: No Slough/Fibrino No Wound Bed Granulation Amount: Small (1-33%) Exposed Structure Necrotic Amount: None Present (0%) Fascia Exposed: No Fat Layer (Subcutaneous Tissue) Exposed: Yes Tendon Exposed: No Muscle Exposed: No Joint Exposed: No Bone Exposed: No Periwound Skin Texture Texture Color No Abnormalities Noted: Yes No Abnormalities Noted: Yes Moisture Temperature / Pain No Abnormalities Noted: Yes Temperature: No Abnormality Electronic Signature(s) [name], [name] ([id number]) [phone].pdf Page 3 of 3 Signed: 10/16/2022 5:28:33 PM By: Baruch Gouty RN, BSN Entered By: Baruch Gouty on 10/16/2022 12:16:29 -------------------------------------------------------------------------------- Vitals Details Patient Name: Date of Service: Sarah Seltzer D. 10/16/2022 10:30 A M Medical Record Number: [id number] Patient Account Number: [id number] Date of Birth/Sex: Treating RN: 16-Apr-1941 (82 y.o. [name] Primary Care Anea Fodera: Nolene Ebbs Other Clinician: Referring  Livier Hendel: Treating Sharaya Boruff/Extender: Consuelo Pandy in Treatment: 100 Vital Signs Time Taken: 10:56 Temperature (F): 97.8 Pulse (bpm): 74 Respiratory Rate (breaths/min): 18 Blood Pressure (mmHg): 132/68 Reference Range: 80 - 120 mg / dl Airway Pulse Oximetry (%): 96 Electronic Signature(s) Signed: 10/16/2022 5:28:33 PM By: Baruch Gouty RN, BSN Entered By: Baruch Gouty on 10/16/2022 10:58:09

## 2022-11-13 ENCOUNTER — Encounter (HOSPITAL_BASED_OUTPATIENT_CLINIC_OR_DEPARTMENT_OTHER): Payer: Medicare Other | Attending: General Surgery | Admitting: General Surgery

## 2022-11-13 DIAGNOSIS — I428 Other cardiomyopathies: Secondary | ICD-10-CM | POA: Diagnosis not present

## 2022-11-13 DIAGNOSIS — F0150 Vascular dementia without behavioral disturbance: Secondary | ICD-10-CM | POA: Insufficient documentation

## 2022-11-13 DIAGNOSIS — I10 Essential (primary) hypertension: Secondary | ICD-10-CM | POA: Insufficient documentation

## 2022-11-13 DIAGNOSIS — Z981 Arthrodesis status: Secondary | ICD-10-CM | POA: Diagnosis not present

## 2022-11-13 DIAGNOSIS — Z86718 Personal history of other venous thrombosis and embolism: Secondary | ICD-10-CM | POA: Diagnosis not present

## 2022-11-13 DIAGNOSIS — Z7901 Long term (current) use of anticoagulants: Secondary | ICD-10-CM | POA: Insufficient documentation

## 2022-11-13 DIAGNOSIS — L89323 Pressure ulcer of left buttock, stage 3: Secondary | ICD-10-CM | POA: Insufficient documentation
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
## 2022-11-14 NOTE — Progress Notes (Signed)
[name] ([id number]) [phone].pdf Page 1 of 2 Visit Report for 11/13/2022 Allergy List Details Patient Name: Date of Service: [name] 11/13/2022 10:30 A M Medical Record Number: [id number] Patient Account Number: [id number] Date of Birth/Sex: Treating RN: 1940/11/15 (82 y.o. [name] Primary Care [name]: [name] Other Clinician: Referring [name]: Treating [name]/Extender: [name] in Treatment: 104 Allergies Active Allergies codeine alteplase Allergy Notes Electronic Signature(s) Signed: 11/13/2022 6:19:38 PM By: Baruch Gouty RN, BSN Entered By: Baruch Gouty on 11/13/2022 17:47:52 -------------------------------------------------------------------------------- Patient/Caregiver Education Details Patient Name: Date of Service: [name] 4/3/2024andnbsp10:30 A M Medical Record Number: [id number] Patient Account Number: [id number] Date of Birth/Gender: Treating RN: 1941-06-13 (82 y.o. [name] Primary Care Physician: [name] Other Clinician: Referring Physician: Treating Physician/Extender: [name] in Treatment: [phone] Education Assessment Education Provided To: Patient Education Topics Provided Wound/Skin Impairment: Methods: Explain/Verbal Responses: Reinforcements needed, State content correctly [employer]) Signed: 11/13/2022 6:19:38 PM By: Baruch Gouty RN, BSN Entered By: Baruch Gouty on 11/13/2022 17:52:44 -------------------------------------------------------------------------------- Wound Assessment Details Patient Name: Date of Service: Sarah Seltzer D. 11/13/2022 10:30 A M Medical Record Number: [id number] Patient Account Number: [id number] Date of Birth/Sex: Treating RN: [birth year] (82 y.o. [name] Primary Care [name]: [name] Other Clinician: Referring [name]: Treating  [name]/Extender: [name] in Treatment: 104 Wound Status Wound Number: 5 Primary Etiology: Pressure Ulcer Wound Location: Left Gluteus Wound Status: Open Wounding Event: Gradually Appeared Comorbid History: Hypertension, Dementia [name], [name] ([id number]) [id number] Page 2 of 2 Date Acquired: 09/12/2020 Weeks Of Treatment: 104 Clustered Wound: No Wound Measurements Length: (cm) 0.1 Width: (cm) 0.3 Depth: (cm) 0.1 Area: (cm) 0.024 Volume: (cm) 0.002 % Reduction in Area: 99% % Reduction in Volume: 99.9% Epithelialization: Small (1-33%) Wound Description Classification: Category/Stage III Wound Margin: Well defined, not attached Exudate Amount: Medium Exudate Type: Purulent Exudate Color: yellow, brown, green Foul Odor After Cleansing: No Slough/Fibrino No Wound Bed Granulation Amount: Small (1-33%) Exposed Structure Granulation Quality: Red Fascia Exposed: No Necrotic Amount: None Present (0%) Fat Layer (Subcutaneous Tissue) Exposed: Yes Tendon Exposed: No Muscle Exposed: No Joint Exposed: No Bone Exposed: No Periwound Skin Texture Texture Color No Abnormalities Noted: Yes No Abnormalities Noted: Yes Moisture Temperature / Pain No Abnormalities Noted: Yes Temperature: No Abnormality Electronic Signature(s) Signed: 11/13/2022 6:19:38 PM By: Baruch Gouty RN, BSN Entered By: Baruch Gouty on 11/13/2022 17:47:44 -------------------------------------------------------------------------------- Vitals Details Patient Name: Date of Service: Sarah Seltzer D. 11/13/2022 10:30 A M Medical Record Number: [id number] Patient Account Number: [id number] Date of Birth/Sex: Treating RN: 10-18-1940 (82 y.o. [name] Primary Care [name]: [name] Other Clinician: Referring [name]: Treating [name]/Extender: [name] in Treatment: 104 Vital Signs Time Taken:  10:50 Temperature (F): 97.8 Pulse (bpm): 74 Blood Pressure (mmHg): 128/70 Reference Range: 80 - 120 mg / dl Airway Pulse Oximetry (%): 98 Inhaled Oxygen Concentration (%): 0 Electronic Signature(s) Signed: 11/13/2022 6:19:38 PM By: Baruch Gouty RN, BSN Entered By: Baruch Gouty on 11/13/2022 17:45:29

## 2023-01-01 ENCOUNTER — Ambulatory Visit (HOSPITAL_BASED_OUTPATIENT_CLINIC_OR_DEPARTMENT_OTHER): Payer: Medicare Other | Admitting: General Surgery

## 2023-01-01 ENCOUNTER — Encounter (HOSPITAL_BASED_OUTPATIENT_CLINIC_OR_DEPARTMENT_OTHER): Payer: Medicare Other | Attending: General Surgery | Admitting: General Surgery

## 2023-01-01 DIAGNOSIS — I69318 Other symptoms and signs involving cognitive functions following cerebral infarction: Secondary | ICD-10-CM | POA: Insufficient documentation

## 2023-01-01 DIAGNOSIS — F0150 Vascular dementia without behavioral disturbance: Secondary | ICD-10-CM | POA: Diagnosis not present

## 2023-01-01 DIAGNOSIS — Z86718 Personal history of other venous thrombosis and embolism: Secondary | ICD-10-CM | POA: Diagnosis not present

## 2023-01-01 DIAGNOSIS — L89103 Pressure ulcer of unspecified part of back, stage 3: Secondary | ICD-10-CM | POA: Insufficient documentation

## 2023-01-01 DIAGNOSIS — L89153 Pressure ulcer of sacral region, stage 3: Secondary | ICD-10-CM | POA: Insufficient documentation

## 2023-01-01 DIAGNOSIS — Z87891 Personal history of nicotine dependence: Secondary | ICD-10-CM | POA: Insufficient documentation

## 2023-01-01 DIAGNOSIS — Z7901 Long term (current) use of anticoagulants: Secondary | ICD-10-CM | POA: Insufficient documentation

## 2023-01-01 DIAGNOSIS — Z981 Arthrodesis status: Secondary | ICD-10-CM | POA: Insufficient documentation

## 2023-01-01 DIAGNOSIS — Z09 Encounter for follow-up examination after completed treatment for conditions other than malignant neoplasm: Secondary | ICD-10-CM | POA: Diagnosis not present

## 2023-01-01 DIAGNOSIS — I428 Other cardiomyopathies: Secondary | ICD-10-CM | POA: Insufficient documentation

## 2023-01-01 DIAGNOSIS — I10 Essential (primary) hypertension: Secondary | ICD-10-CM | POA: Insufficient documentation

## 2023-01-01 DIAGNOSIS — L89323 Pressure ulcer of left buttock, stage 3: Secondary | ICD-10-CM | POA: Diagnosis present

## 2023-01-26 ENCOUNTER — Inpatient Hospital Stay (HOSPITAL_COMMUNITY)
Admission: EM | Admit: 2023-01-26 | Discharge: 2023-01-30 | DRG: 871 | Disposition: A | Discharge status: Home Health | Payer: Medicare Other | Attending: Internal Medicine | Admitting: Internal Medicine

## 2023-01-26 ENCOUNTER — Emergency Department (HOSPITAL_COMMUNITY): Payer: Medicare Other

## 2023-01-26 ENCOUNTER — Encounter (HOSPITAL_COMMUNITY): Payer: Self-pay

## 2023-01-26 DIAGNOSIS — F32A Depression, unspecified: Secondary | ICD-10-CM | POA: Diagnosis present

## 2023-01-26 DIAGNOSIS — Z90722 Acquired absence of ovaries, bilateral: Secondary | ICD-10-CM

## 2023-01-26 DIAGNOSIS — E876 Hypokalemia: Secondary | ICD-10-CM | POA: Diagnosis present

## 2023-01-26 DIAGNOSIS — F0393 Unspecified dementia, unspecified severity, with mood disturbance: Secondary | ICD-10-CM | POA: Diagnosis present

## 2023-01-26 DIAGNOSIS — Z6830 Body mass index (BMI) 30.0-30.9, adult: Secondary | ICD-10-CM

## 2023-01-26 DIAGNOSIS — Z86718 Personal history of other venous thrombosis and embolism: Secondary | ICD-10-CM

## 2023-01-26 DIAGNOSIS — G8929 Other chronic pain: Secondary | ICD-10-CM | POA: Diagnosis present

## 2023-01-26 DIAGNOSIS — Z66 Do not resuscitate: Secondary | ICD-10-CM | POA: Diagnosis present

## 2023-01-26 DIAGNOSIS — M549 Dorsalgia, unspecified: Secondary | ICD-10-CM | POA: Diagnosis present

## 2023-01-26 DIAGNOSIS — M419 Scoliosis, unspecified: Secondary | ICD-10-CM | POA: Diagnosis present

## 2023-01-26 DIAGNOSIS — L89154 Pressure ulcer of sacral region, stage 4: Secondary | ICD-10-CM | POA: Diagnosis present

## 2023-01-26 DIAGNOSIS — F411 Generalized anxiety disorder: Secondary | ICD-10-CM | POA: Diagnosis present

## 2023-01-26 DIAGNOSIS — E440 Moderate protein-calorie malnutrition: Secondary | ICD-10-CM | POA: Diagnosis present

## 2023-01-26 DIAGNOSIS — L03114 Cellulitis of left upper limb: Secondary | ICD-10-CM | POA: Diagnosis present

## 2023-01-26 DIAGNOSIS — E785 Hyperlipidemia, unspecified: Secondary | ICD-10-CM | POA: Diagnosis present

## 2023-01-26 DIAGNOSIS — M1990 Unspecified osteoarthritis, unspecified site: Secondary | ICD-10-CM | POA: Diagnosis present

## 2023-01-26 DIAGNOSIS — K5909 Other constipation: Secondary | ICD-10-CM | POA: Diagnosis present

## 2023-01-26 DIAGNOSIS — D696 Thrombocytopenia, unspecified: Secondary | ICD-10-CM | POA: Diagnosis present

## 2023-01-26 DIAGNOSIS — A419 Sepsis, unspecified organism: Secondary | ICD-10-CM | POA: Diagnosis present

## 2023-01-26 DIAGNOSIS — Z888 Allergy status to other drugs, medicaments and biological substances status: Secondary | ICD-10-CM

## 2023-01-26 DIAGNOSIS — I10 Essential (primary) hypertension: Secondary | ICD-10-CM | POA: Diagnosis present

## 2023-01-26 DIAGNOSIS — L89104 Pressure ulcer of unspecified part of back, stage 4: Secondary | ICD-10-CM

## 2023-01-26 DIAGNOSIS — K8070 Calculus of gallbladder and bile duct without cholecystitis without obstruction: Secondary | ICD-10-CM | POA: Diagnosis present

## 2023-01-26 DIAGNOSIS — F0390 Unspecified dementia without behavioral disturbance: Secondary | ICD-10-CM | POA: Diagnosis present

## 2023-01-26 DIAGNOSIS — G9341 Metabolic encephalopathy: Secondary | ICD-10-CM | POA: Diagnosis present

## 2023-01-26 DIAGNOSIS — L8990 Pressure ulcer of unspecified site, unspecified stage: Secondary | ICD-10-CM | POA: Diagnosis present

## 2023-01-26 DIAGNOSIS — R6521 Severe sepsis with septic shock: Secondary | ICD-10-CM | POA: Diagnosis present

## 2023-01-26 DIAGNOSIS — R5381 Other malaise: Secondary | ICD-10-CM | POA: Diagnosis present

## 2023-01-26 DIAGNOSIS — Z1152 Encounter for screening for COVID-19: Secondary | ICD-10-CM

## 2023-01-26 DIAGNOSIS — Z87891 Personal history of nicotine dependence: Secondary | ICD-10-CM

## 2023-01-26 DIAGNOSIS — Z8673 Personal history of transient ischemic attack (TIA), and cerebral infarction without residual deficits: Secondary | ICD-10-CM

## 2023-01-26 DIAGNOSIS — I480 Paroxysmal atrial fibrillation: Secondary | ICD-10-CM | POA: Diagnosis present

## 2023-01-26 DIAGNOSIS — Z86711 Personal history of pulmonary embolism: Secondary | ICD-10-CM

## 2023-01-26 DIAGNOSIS — Z95828 Presence of other vascular implants and grafts: Secondary | ICD-10-CM | POA: Diagnosis not present

## 2023-01-26 DIAGNOSIS — R4189 Other symptoms and signs involving cognitive functions and awareness: Secondary | ICD-10-CM | POA: Diagnosis present

## 2023-01-26 DIAGNOSIS — G9340 Encephalopathy, unspecified: Secondary | ICD-10-CM | POA: Diagnosis present

## 2023-01-26 DIAGNOSIS — Z7901 Long term (current) use of anticoagulants: Secondary | ICD-10-CM

## 2023-01-26 DIAGNOSIS — Z96652 Presence of left artificial knee joint: Secondary | ICD-10-CM | POA: Diagnosis present

## 2023-01-26 DIAGNOSIS — Z880 Allergy status to penicillin: Secondary | ICD-10-CM

## 2023-01-26 DIAGNOSIS — G4730 Sleep apnea, unspecified: Secondary | ICD-10-CM | POA: Diagnosis present

## 2023-01-26 DIAGNOSIS — Z79899 Other long term (current) drug therapy: Secondary | ICD-10-CM

## 2023-01-26 DIAGNOSIS — Z885 Allergy status to narcotic agent status: Secondary | ICD-10-CM

## 2023-01-26 DIAGNOSIS — Z7401 Bed confinement status: Secondary | ICD-10-CM

## 2023-01-26 DIAGNOSIS — L89314 Pressure ulcer of right buttock, stage 4: Secondary | ICD-10-CM | POA: Diagnosis present

## 2023-01-26 DIAGNOSIS — L0390 Cellulitis, unspecified: Secondary | ICD-10-CM | POA: Diagnosis not present

## 2023-01-26 LAB — COMPREHENSIVE METABOLIC PANEL
ALT: 21 U/L (ref 0–44)
AST: 24 U/L (ref 15–41)
Albumin: 2.7 g/dL — ABNORMAL LOW (ref 3.5–5.0)
Alkaline Phosphatase: 55 U/L (ref 38–126)
Anion gap: 7 (ref 5–15)
BUN: 22 mg/dL (ref 8–23)
CO2: 27 mmol/L (ref 22–32)
Calcium: 8.7 mg/dL — ABNORMAL LOW (ref 8.9–10.3)
Chloride: 103 mmol/L (ref 98–111)
Creatinine, Ser: 0.59 mg/dL (ref 0.44–1.00)
GFR, Estimated: >60 mL/min (ref >60)
Glucose, Bld: 114 mg/dL — ABNORMAL HIGH (ref 70–99)
Potassium: 3.9 mmol/L (ref 3.5–5.1)
Sodium: 137 mmol/L (ref 135–145)
Total Bilirubin: 1.2 mg/dL (ref 0.3–1.2)
Total Protein: 6.5 g/dL (ref 6.5–8.1)

## 2023-01-26 LAB — CBC WITH DIFFERENTIAL/PLATELET
Abs Immature Granulocytes: 0.14 10*3/uL — ABNORMAL HIGH (ref 0.00–0.07)
Basophils Absolute: 0 10*3/uL (ref 0.0–0.1)
Basophils Relative: 0 %
Eosinophils Absolute: 0 10*3/uL (ref 0.0–0.5)
Eosinophils Relative: 0 %
HCT: 42 % (ref 36.0–46.0)
Hemoglobin: 13.5 g/dL (ref 12.0–15.0)
Immature Granulocytes: 1 %
Lymphocytes Relative: 5 %
Lymphs Abs: 1.1 10*3/uL (ref 0.7–4.0)
MCH: 33.3 pg (ref 26.0–34.0)
MCHC: 32.1 g/dL (ref 30.0–36.0)
MCV: 103.7 fL — ABNORMAL HIGH (ref 80.0–100.0)
Monocytes Absolute: 1 10*3/uL (ref 0.1–1.0)
Monocytes Relative: 5 %
Neutro Abs: 18.6 10*3/uL — ABNORMAL HIGH (ref 1.7–7.7)
Neutrophils Relative %: 89 %
Platelets: 127 10*3/uL — ABNORMAL LOW (ref 150–400)
RBC: 4.05 MIL/uL (ref 3.87–5.11)
RDW: 13.4 % (ref 11.5–15.5)
WBC: 20.9 10*3/uL — ABNORMAL HIGH (ref 4.0–10.5)
nRBC: 0 % (ref 0.0–0.2)

## 2023-01-26 LAB — URINALYSIS, W/ REFLEX TO CULTURE (INFECTION SUSPECTED)
Bilirubin Urine: NEGATIVE
Glucose, UA: NEGATIVE mg/dL
Hgb urine dipstick: NEGATIVE
Ketones, ur: NEGATIVE mg/dL
Leukocytes,Ua: NEGATIVE
Nitrite: NEGATIVE
Protein, ur: NEGATIVE mg/dL
Specific Gravity, Urine: 1.015 (ref 1.005–1.030)
pH: 8 (ref 5.0–8.0)

## 2023-01-26 LAB — APTT: aPTT: 33 seconds (ref 24–36)

## 2023-01-26 LAB — RESP PANEL BY RT-PCR (RSV, FLU A&B, COVID)  RVPGX2
Influenza A by PCR: NEGATIVE
Influenza B by PCR: NEGATIVE
Resp Syncytial Virus by PCR: NEGATIVE
SARS Coronavirus 2 by RT PCR: NEGATIVE

## 2023-01-26 LAB — PROTIME-INR
INR: 1.5 — ABNORMAL HIGH (ref 0.8–1.2)
Prothrombin Time: 18.2 seconds — ABNORMAL HIGH (ref 11.4–15.2)

## 2023-01-26 LAB — LACTIC ACID, PLASMA: Lactic Acid, Venous: 1.4 mmol/L (ref 0.5–1.9)

## 2023-01-26 MED ORDER — ACETAMINOPHEN 650 MG RE SUPP
650.0000 mg | Freq: Once | RECTAL | Status: AC
  Administered 2023-01-26: 650 mg via RECTAL
  Filled 2023-01-26: qty 1

## 2023-01-26 MED ORDER — ACETAMINOPHEN 325 MG PO TABS
650.0000 mg | ORAL_TABLET | Freq: Four times a day (QID) | ORAL | Status: DC | PRN
  Administered 2023-01-27 (×2): 650 mg via ORAL
  Filled 2023-01-26 (×2): qty 2

## 2023-01-26 MED ORDER — LACTATED RINGERS IV BOLUS (SEPSIS)
1000.0000 mL | Freq: Once | INTRAVENOUS | Status: AC
Start: 2023-01-26 — End: 2023-01-26
  Administered 2023-01-26: 1000 mL via INTRAVENOUS

## 2023-01-26 MED ORDER — IOHEXOL 300 MG/ML  SOLN
100.0000 mL | Freq: Once | INTRAMUSCULAR | Status: AC | PRN
  Administered 2023-01-26: 100 mL via INTRAVENOUS

## 2023-01-26 MED ORDER — VANCOMYCIN HCL IN DEXTROSE 1-5 GM/200ML-% IV SOLN
1000.0000 mg | Freq: Once | INTRAVENOUS | Status: AC
  Administered 2023-01-26: 1000 mg via INTRAVENOUS
  Filled 2023-01-26: qty 200

## 2023-01-26 MED ORDER — POLYETHYLENE GLYCOL 3350 17 G PO PACK: 17.0000 g | PACK | Freq: Every day | ORAL | Status: DC | PRN

## 2023-01-26 MED ORDER — LACTATED RINGERS IV BOLUS (SEPSIS)
1000.0000 mL | Freq: Once | INTRAVENOUS | Status: AC
  Administered 2023-01-26: 1000 mL via INTRAVENOUS

## 2023-01-26 MED ORDER — CYCLOSPORINE 0.05 % OP EMUL: 1.0000 [drp] | Freq: Every day | OPHTHALMIC | Status: DC | PRN

## 2023-01-26 MED ORDER — BENZONATATE 100 MG PO CAPS: 100.0000 mg | ORAL_CAPSULE | Freq: Three times a day (TID) | ORAL | Status: DC | PRN

## 2023-01-26 MED ORDER — LINACLOTIDE 145 MCG PO CAPS
145.0000 ug | ORAL_CAPSULE | Freq: Every day | ORAL | Status: DC
  Administered 2023-01-27 – 2023-01-30 (×4): 145 ug via ORAL
  Filled 2023-01-26 (×5): qty 1

## 2023-01-26 MED ORDER — HYDROCODONE-ACETAMINOPHEN 5-325 MG PO TABS
1.0000 | ORAL_TABLET | ORAL | Status: DC | PRN
  Administered 2023-01-29: 2 via ORAL
  Filled 2023-01-26: qty 2

## 2023-01-26 MED ORDER — ACETAMINOPHEN 650 MG RE SUPP: 650.0000 mg | Freq: Four times a day (QID) | RECTAL | Status: DC | PRN

## 2023-01-26 MED ORDER — ONDANSETRON HCL 4 MG/2ML IJ SOLN: 4.0000 mg | Freq: Four times a day (QID) | INTRAMUSCULAR | Status: DC | PRN

## 2023-01-26 MED ORDER — SODIUM CHLORIDE 0.9 % IV SOLN
2.0000 g | INTRAVENOUS | Status: DC
  Administered 2023-01-26: 2 g via INTRAVENOUS
  Filled 2023-01-26: qty 20

## 2023-01-26 MED ORDER — APIXABAN 5 MG PO TABS
5.0000 mg | ORAL_TABLET | Freq: Two times a day (BID) | ORAL | Status: DC
  Administered 2023-01-27 – 2023-01-30 (×7): 5 mg via ORAL
  Filled 2023-01-26 (×6): qty 1

## 2023-01-26 MED ORDER — BUSPIRONE HCL 5 MG PO TABS
15.0000 mg | ORAL_TABLET | Freq: Two times a day (BID) | ORAL | Status: DC
  Administered 2023-01-27 – 2023-01-30 (×7): 15 mg via ORAL
  Filled 2023-01-26 (×7): qty 1

## 2023-01-26 MED ORDER — LISINOPRIL 10 MG PO TABS
10.0000 mg | ORAL_TABLET | Freq: Every day | ORAL | Status: DC
  Administered 2023-01-27 – 2023-01-30 (×4): 10 mg via ORAL
  Filled 2023-01-26 (×4): qty 1

## 2023-01-26 MED ORDER — ONDANSETRON HCL 4 MG PO TABS: 4.0000 mg | ORAL_TABLET | Freq: Four times a day (QID) | ORAL | Status: DC | PRN

## 2023-01-26 MED ORDER — ATORVASTATIN CALCIUM 10 MG PO TABS
20.0000 mg | ORAL_TABLET | Freq: Every day | ORAL | Status: DC
  Administered 2023-01-27 – 2023-01-29 (×3): 20 mg via ORAL
  Filled 2023-01-26 (×3): qty 2

## 2023-01-26 MED ORDER — ACETAMINOPHEN 325 MG PO TABS
650.0000 mg | ORAL_TABLET | Freq: Once | ORAL | Status: DC
  Filled 2023-01-26: qty 2

## 2023-01-26 MED ORDER — MEMANTINE HCL ER 28 MG PO CP24
28.0000 mg | ORAL_CAPSULE | Freq: Every day | ORAL | Status: DC
  Administered 2023-01-27 – 2023-01-30 (×4): 28 mg via ORAL
  Filled 2023-01-26 (×5): qty 1

## 2023-01-26 MED ORDER — LACTATED RINGERS IV SOLN: INTRAVENOUS | Status: DC

## 2023-01-26 NOTE — H&P (Signed)
History and Physical    [name]:[id number] DOB: 01-22-1941 DOA: 01/26/2023  PCP: [name], MD   Chief Complaint: ams  HPI: [name] is a 82 y.o. female with medical history significant of hypertension, hyperlipidemia, prior stroke, depression who presented to the emergency department due to altered mental status.  Patient reportedly lives at home with family and has wound care that helps take care of her chronic wounds.  Family noted her to be more confused so brought her to the emergency department for further assessment.  On arrival she was notably altered and unable to specify where she was.  She endorsed headache but denied other complaints.  She had noticeable foul-smelling urine on presentation.  On arrival she was febrile to 101.7 and hypertensive with systolics in the 180s.  Labs were obtained which revealed WBC 20.9, platelets 127, INR 1.5.  CT chest abdomen pelvis was obtained which showed cholelithiasis and choledocholithiasis with mild dilation of the biliary tree with recommended ultrasound/MRCP.  She is also noted to be constipated.  Examination revealed erythematous sacral decubitus ulcers.  Patient was admitted for further workup.  She was started on vancomycin and ceftriaxone.  On admission she was unable to contribute to her presentation.  She did not know her name or where she was.  She endorsed headache.   Review of Systems: Review of Systems  All other systems reviewed and are negative.    As per HPI otherwise 10 point review of systems negative.   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    Past Surgical History:  Procedure Laterality Date   ARTHROSCOPIC REPAIR ACL  02/03/2004   HARDWARE REMOVAL N/A 07/27/2020   Procedure: Lumbar Hardware removal with lumbar wound debridement;  Surgeon: Barnett Abu, MD;  Location: [facility] OR;  Service: Neurosurgery;  Laterality: N/A;   IR GASTROSTOMY TUBE MOD SED  10/30/2020   IR IVC FILTER PLMT / S&I /IMG GUID/MOD SED  05/10/2020   LEFT HEART CATH AND CORONARY ANGIOGRAPHY N/A 09/16/2019   Procedure: LEFT HEART CATH AND CORONARY ANGIOGRAPHY;  Surgeon: Yvonne Kendall, MD;  Location: [facility] INVASIVE CV LAB;  Service: Cardiovascular;  Laterality: N/A;   LOOP RECORDER INSERTION N/A 02/21/2020   Procedure: LOOP RECORDER INSERTION;  Surgeon: Duke Salvia, MD;  Location: [facility] INVASIVE CV LAB;  Service: Cardiovascular;  Laterality: N/A;   LUMBAR WOUND DEBRIDEMENT N/A 07/27/2020   Procedure: LUMBAR WOUND DEBRIDEMENT;  Surgeon: Barnett Abu, MD;  Location: [facility] OR;  Service: Neurosurgery;  Laterality: N/A;   OOPHORECTOMY  1970   SPINE SURGERY  03/31/2006   TOTAL KNEE ARTHROPLASTY Left 05/17/2016   Procedure: LEFT TOTAL KNEE ARTHROPLASTY;  Surgeon: Kathryne Hitch, MD;  Location: [facility] ORS;  Service: Orthopedics;  Laterality: Left;  Adductor Block; Spinal to General   TUBAL LIGATION  1983  reports that she quit smoking about 49 years ago. Her smoking use included cigarettes. She has a 10.00 pack-year smoking history. She has never used smokeless tobacco. She reports that she does not drink alcohol and does not use drugs.  Family History  Problem Relation Age of Onset   Other Father        kidney problems    Prior to Admission medications   Medication Sig Start Date End Date Taking? Authorizing Provider  mupirocin cream (BACTROBAN) 2 % Apply 1 Application topically 2 (two)  times daily. 01/18/23  Yes [provider]  acetaminophen (TYLENOL) 325 MG tablet Take 2 tablets (650 mg total) by mouth every 4 (four) hours as needed for mild pain or moderate pain (or Fever >/= 101). 08/06/20   Lewie Chamber, MD  albuterol (VENTOLIN HFA) 108 (90 Base) MCG/ACT inhaler Inhale 2 puffs into the lungs every 6 (six) hours as needed for wheezing or shortness of breath.    [provider]  atorvastatin (LIPITOR) 20 MG tablet Take 20 mg by mouth at bedtime. 09/15/20   [provider]  benzonatate (TESSALON) 100 MG capsule Take 100 mg by mouth 3 (three) times daily as needed for cough.    [provider]  busPIRone (BUSPAR) 30 MG tablet Take 15 mg by mouth 2 (two) times daily. 09/22/20   [provider]  cephALEXin (KEFLEX) 500 MG capsule Take 1 capsule (500 mg total) by mouth 4 (four) times daily. 02/04/22   Peter Garter, PA  cholecalciferol (VITAMIN D3) 25 MCG (1000 UNIT) tablet Take 1,000 Units by mouth daily.    [provider]  clotrimazole-betamethasone (LOTRISONE) cream Apply 1 Application topically See admin instructions. Apply to buttocks 2 times a day    [provider]  cycloSPORINE (RESTASIS) 0.05 % ophthalmic emulsion Place 1 drop into both eyes daily as needed (dry eyes). Patient taking differently: Place 1 drop into both eyes daily as needed (for redness). 02/21/20   Layne Benton, NP  ELIQUIS 5 MG TABS tablet Take 5 mg by mouth in the morning and at bedtime. 09/26/20   [provider]  feeding supplement (ENSURE ENLIVE / ENSURE PLUS) LIQD Take 237 mLs by mouth 3 (three) times daily between meals. 05/30/20   Swayze, Ava, DO  ferrous sulfate 325 (65 FE) MG tablet Take 1 tablet (325 mg total) by mouth daily with breakfast. 08/06/20   Lewie Chamber, MD  fexofenadine (ALLEGRA) 180 MG tablet Take 180 mg by mouth daily as needed for allergies or rhinitis.    [provider]  fluticasone (FLONASE) 50  MCG/ACT nasal spray Place 2 sprays into both nostrils daily as needed for allergies or rhinitis.    [provider]  furosemide (LASIX) 20 MG tablet Take 10 mg by mouth daily as needed for fluid or edema.    [provider]  gentamicin cream (GARAMYCIN) 0.1 % Apply 1 Application topically See admin instructions. Apply around the feeding tube with every change and every morning with wound dressing change    [provider]  LINZESS 145 MCG CAPS capsule Take 145 mcg by mouth daily. 04/16/20   [provider]  lisinopril (ZESTRIL) 10 MG tablet Take 10 mg by mouth daily.    [provider]  memantine (NAMENDA XR) 28 MG CP24 24 hr capsule Take 1 capsule (28 mg total) by mouth daily. 03/09/20   Angiulli, Mcarthur Rossetti, PA-C  Multiple Vitamins-Minerals (CENTRUM SILVER 50+WOMEN) TABS Take 1 tablet by  mouth in the morning.    [provider]  nitroGLYCERIN (NITROSTAT) 0.4 MG SL tablet Place 1 tablet (0.4 mg total) under the tongue every 5 (five) minutes as needed for chest pain. 09/17/19   Duke, Roe Rutherford, PA  Nutritional Supplements (NUTREN 1.5) LIQD     [provider]  nystatin powder Apply 1 Application topically See admin instructions. Apply to buttocks 2 times a day (after applying clotrimazole-betamethasone cream)    [provider]  OVER THE COUNTER MEDICATION 1 patch See admin instructions. Calcium Alginate with Antimicrobial Silver Sheet Dressing- Change dressing every morning (left "tunnel")    [provider]  Oxycodone HCl 20 MG TABS Take 20 mg by mouth See admin instructions. Take 20 mg by mouth every 4-6 hours as needed for pain; not to exceed 5 tablets in 24 hours    [provider]  polyethylene glycol (MIRALAX / GLYCOLAX) 17 g packet Take 17 g by mouth 2 (two) times daily. 03/09/20   Angiulli, Mcarthur Rossetti, PA-C  potassium chloride (KLOR-CON) 10 MEQ tablet Take 5 mEq by mouth See admin instructions. Take 5 mEq by  mouth once a day only when taking Furosemide 09/19/20   [provider]  vitamin C (ASCORBIC ACID) 500 MG tablet Take 500 mg by mouth daily.    [provider]  zinc gluconate 50 MG tablet Take 50 mg by mouth daily.    [provider]  zolpidem (AMBIEN) 5 MG tablet Take 1 tablet (5 mg total) by mouth at bedtime as needed for sleep (Insomnia). Patient not taking: No sig reported 05/30/20 08/05/20  Fran Lowes, DO    Physical Exam: Vitals:   01/26/23 2300 01/26/23 2315 01/26/23 2330 01/26/23 2345  BP: (!) 149/55 (!) 148/57 (!) 146/62 (!) 124/58  Pulse: 64 64 77 63  Resp: 17 19 15 13   Temp:      TempSrc:      SpO2: 94% 95% 96% 95%   Physical Exam Vitals reviewed.  Constitutional:      Appearance: She is normal weight.  HENT:     Head: Normocephalic.     Nose: Nose normal.     Mouth/Throat:     Mouth: Mucous membranes are moist.     Pharynx: Oropharynx is clear.  Eyes:     Pupils: Pupils are equal, round, and reactive to light.  Cardiovascular:     Rate and Rhythm: Normal rate.     Pulses: Normal pulses.     Heart sounds: Normal heart sounds.  Pulmonary:     Effort: Pulmonary effort is normal.     Breath sounds: Normal breath sounds.  Abdominal:     General: Abdomen is flat. Bowel sounds are normal.  Musculoskeletal:     Cervical back: Normal range of motion.  Skin:    Comments: Sacral decubitis ulcer with surrounding erythema  Neurological:     Mental Status: She is alert.       Labs on Admission: I have personally reviewed the patients's labs and imaging studies.  Assessment/Plan Principal Problem:   Decubitus ulcer   # Sepsis secondary to urinary tract infection and/or infected sacral decubitus ulcer, POA, active # Acute infectious encephalopathy secondary to UTI/infected sacral decubitus ulcer, POA, active - Patient febrile to 101.7 -Only oriented to self does not know where she is or time -Leukocytosis 20.7 - Urinalysis negative for  infection Plan: Continue vancomycin and ceftriaxone Consult wound IVF Follow-up cultures  # Biliary ductal dilation in setting  of altered mental status, POA, active -LFTs are normal - No right upper quadrant pain  Plan: Trend LFTs Check right upper quadrant ultrasound  # Headache-obtain CT head.  Patient is unable to participate enough in exam to quantify and qualify her headache  # Thrombocytopenia-follow CBC  # Hyperlipidemia-continue Lipitor  # Paroxysmal A-fib-continue Eliquis  # Chronic constipation-continue Linzess, give dose of mag citrate given constipation on imaging  # Hypertension-continue lisinopril  #GAD- continue buspar  # Moderate cognitive impairment-continue Namenda  Admission status: Inpatient Med-Surg  Certification: The appropriate patient status for this patient is INPATIENT. Inpatient status is judged to be reasonable and necessary in order to provide the required intensity of service to ensure the patient's safety. The patient's presenting symptoms, physical exam findings, and initial radiographic and laboratory data in the context of their chronic comorbidities is felt to place them at high risk for further clinical deterioration. Furthermore, it is not anticipated that the patient will be medically stable for discharge from the hospital within 2 midnights of admission.   * I certify that at the point of admission it is my clinical judgment that the patient will require inpatient hospital care spanning beyond 2 midnights from the point of admission due to high intensity of service, high risk for further deterioration and high frequency of surveillance required.Alan Mulder MD [facility] If 7PM-7AM, please contact night-coverage [email or web address]  01/26/2023, 11:58 PM

## 2023-01-26 NOTE — ED Provider Notes (Addendum)
EMERGENCY DEPARTMENT AT [facility] Provider Note   CSN: [id number] Arrival date & time: 01/26/23  1935     History  Chief Complaint  Patient presents with   Altered Mental Status    [name] is a 82 y.o. female with a history of dementia who is bedbound, known sacral decub ulcer here presenting with altered mental status.  Patient lives at home with family.  She has wound care that visits her.  Patient has a known sacral decub ulcer.  Patient was noted to be more confused than usual.  Patient also concerned that she may have UTIs as her urine appears to be foul-smelling.  Patient also chills.  The history is provided by the EMS personnel and a relative.       Home Medications Prior to Admission medications   Medication Sig Start Date End Date Taking? Authorizing Provider  mupirocin cream (BACTROBAN) 2 % Apply 1 Application topically 2 (two) times daily. 01/18/23  Yes [provider]  acetaminophen (TYLENOL) 325 MG tablet Take 2 tablets (650 mg total) by mouth every 4 (four) hours as needed for mild pain or moderate pain (or Fever >/= 101). 08/06/20   Lewie Chamber, MD  albuterol (VENTOLIN HFA) 108 (90 Base) MCG/ACT inhaler Inhale 2 puffs into the lungs every 6 (six) hours as needed for wheezing or shortness of breath.    [provider]  atorvastatin (LIPITOR) 20 MG tablet Take 20 mg by mouth at bedtime. 09/15/20   [provider]  benzonatate (TESSALON) 100 MG capsule Take 100 mg by mouth 3 (three) times daily as needed for cough.    [provider]  busPIRone (BUSPAR) 30 MG tablet Take 15 mg by mouth 2 (two) times daily. 09/22/20   [provider]  cephALEXin (KEFLEX) 500 MG capsule Take 1 capsule (500 mg total) by mouth 4 (four) times daily. 02/04/22   Peter Garter, PA  cholecalciferol (VITAMIN D3) 25 MCG (1000 UNIT) tablet Take 1,000 Units by mouth daily.    [provider]   clotrimazole-betamethasone (LOTRISONE) cream Apply 1 Application topically See admin instructions. Apply to buttocks 2 times a day    [provider]  cycloSPORINE (RESTASIS) 0.05 % ophthalmic emulsion Place 1 drop into both eyes daily as needed (dry eyes). Patient taking differently: Place 1 drop into both eyes daily as needed (for redness). 02/21/20   Layne Benton, NP  ELIQUIS 5 MG TABS tablet Take 5 mg by mouth in the morning and at bedtime. 09/26/20   [provider]  feeding supplement (ENSURE ENLIVE / ENSURE PLUS) LIQD Take 237 mLs by mouth 3 (three) times daily between meals. 05/30/20   Swayze, Ava, DO  ferrous sulfate 325 (65 FE) MG tablet Take 1 tablet (325 mg total) by mouth daily with breakfast. 08/06/20   Lewie Chamber, MD  fexofenadine (ALLEGRA) 180 MG tablet Take 180 mg by mouth daily as needed for allergies or rhinitis.    [provider]  fluticasone (FLONASE) 50 MCG/ACT nasal spray Place 2 sprays into both nostrils daily as needed for allergies or rhinitis.    [provider]  furosemide (LASIX) 20 MG tablet Take 10 mg by mouth daily as needed for fluid or edema.    [provider]  gentamicin cream (GARAMYCIN) 0.1 % Apply 1 Application topically See admin instructions. Apply around the feeding tube with every change and every morning with wound dressing change    [provider]  LINZESS 145 MCG CAPS capsule Take 145 mcg by mouth daily. 04/16/20   [provider]  lisinopril (ZESTRIL) 10 MG tablet Take 10 mg by mouth daily.    [provider]  memantine (NAMENDA XR) 28 MG CP24 24 hr capsule Take 1 capsule (28 mg total) by mouth daily. 03/09/20   Angiulli, Mcarthur Rossetti, PA-C  Multiple Vitamins-Minerals (CENTRUM SILVER 50+WOMEN) TABS Take 1 tablet by mouth in the morning.    [provider]  nitroGLYCERIN (NITROSTAT) 0.4 MG SL tablet Place 1 tablet (0.4 mg total) under the tongue every 5 (five) minutes as  needed for chest pain. 09/17/19   Duke, Roe Rutherford, PA  Nutritional Supplements (NUTREN 1.5) LIQD     [provider]  nystatin powder Apply 1 Application topically See admin instructions. Apply to buttocks 2 times a day (after applying clotrimazole-betamethasone cream)    [provider]  OVER THE COUNTER MEDICATION 1 patch See admin instructions. Calcium Alginate with Antimicrobial Silver Sheet Dressing- Change dressing every morning (left "tunnel")    [provider]  Oxycodone HCl 20 MG TABS Take 20 mg by mouth See admin instructions. Take 20 mg by mouth every 4-6 hours as needed for pain; not to exceed 5 tablets in 24 hours    [provider]  polyethylene glycol (MIRALAX / GLYCOLAX) 17 g packet Take 17 g by mouth 2 (two) times daily. 03/09/20   Angiulli, Mcarthur Rossetti, PA-C  potassium chloride (KLOR-CON) 10 MEQ tablet Take 5 mEq by mouth See admin instructions. Take 5 mEq by mouth once a day only when taking Furosemide 09/19/20   [provider]  vitamin C (ASCORBIC ACID) 500 MG tablet Take 500 mg by mouth daily.    [provider]  zinc gluconate 50 MG tablet Take 50 mg by mouth daily.    [provider]  zolpidem (AMBIEN) 5 MG tablet Take 1 tablet (5 mg total) by mouth at bedtime as needed for sleep (Insomnia). Patient not taking: No sig reported 05/30/20 08/05/20  Swayze, Ava, DO      Allergies    T-pa [alteplase], Penicillins, and Codeine    Review of Systems   Review of Systems  Constitutional:  Positive for chills and fever.  Skin:  Positive for wound.  All other systems reviewed and are negative.   Physical Exam Updated Vital Signs BP (!) 180/81   Pulse 61   Temp (!) 101.7 F (38.7 C) (Rectal)   Resp 16   SpO2 97%  Physical Exam Vitals and nursing note reviewed.  Constitutional:      Comments: Chronically ill-appearing and demented  HENT:     Head: Normocephalic.     Nose: Nose normal.     Mouth/Throat:      Mouth: Mucous membranes are dry.  Eyes:     Extraocular Movements: Extraocular movements intact.     Pupils: Pupils are equal, round, and reactive to light.  Cardiovascular:     Rate and Rhythm: Normal rate and regular rhythm.     Pulses: Normal pulses.     Heart sounds: Normal heart sounds.  Pulmonary:     Effort: Pulmonary effort is normal.     Breath sounds: Normal breath sounds.  Abdominal:     General: Abdomen is flat.     Palpations: Abdomen is soft.  Musculoskeletal:     Cervical back: Normal range of motion and neck supple.     Comments: Stage IV sacral decub ulcer  with some surrounding cellulitis.  Patient also has a thoracic stage II decub ulcer.  Patient also has some cellulitis of the left arm as well.  No obvious subcutaneous air  Skin:    Capillary Refill: Capillary refill takes less than 2 seconds.  Neurological:     Comments: Demented and bedbound  Psychiatric:        Mood and Affect: Mood normal.     ED Results / Procedures / Treatments   Labs (all labs ordered are listed, but only abnormal results are displayed) Labs Reviewed  COMPREHENSIVE METABOLIC PANEL - Abnormal; Notable for the following components:      Result Value   Glucose, Bld 114 (*)    Calcium 8.7 (*)    Albumin 2.7 (*)    All other components within normal limits  CBC WITH DIFFERENTIAL/PLATELET - Abnormal; Notable for the following components:   WBC 20.9 (*)    MCV 103.7 (*)    Platelets 127 (*)    Neutro Abs 18.6 (*)    Abs Immature Granulocytes 0.14 (*)    All other components within normal limits  PROTIME-INR - Abnormal; Notable for the following components:   Prothrombin Time 18.2 (*)    INR 1.5 (*)    All other components within normal limits  URINALYSIS, W/ REFLEX TO CULTURE (INFECTION SUSPECTED) - Abnormal; Notable for the following components:   Bacteria, UA RARE (*)    All other components within normal limits  RESP PANEL BY RT-PCR (RSV, FLU A&B, COVID)  RVPGX2  CULTURE,  BLOOD (ROUTINE X 2)  CULTURE, BLOOD (ROUTINE X 2)  LACTIC ACID, PLASMA  APTT  LACTIC ACID, PLASMA    EKG None  Radiology CT CHEST ABDOMEN PELVIS W CONTRAST  Result Date: 01/26/2023 CLINICAL DATA:  Sepsis.  Altered mental status. EXAM: CT CHEST, ABDOMEN, AND PELVIS WITH CONTRAST TECHNIQUE: Multidetector CT imaging of the chest, abdomen and pelvis was performed following the standard protocol during bolus administration of intravenous contrast. RADIATION DOSE REDUCTION: This exam was performed according to the departmental dose-optimization program which includes automated exposure control, adjustment of the mA and/or kV according to patient size and/or use of iterative reconstruction technique. CONTRAST:  OMNIPAQUE IOHEXOL 300 MG/ML  SOLN COMPARISON:  CT abdomen pelvis dated 11/28/2021. FINDINGS: CT CHEST FINDINGS Cardiovascular: There is no cardiomegaly or pericardial effusion. There is moderate atherosclerotic calcification of the thoracic aorta. No aneurysmal dilatation or dissection. The aorta is tortuous. The central pulmonary arteries appear patent. Mediastinum/Nodes: No hilar or mediastinal adenopathy. The esophagus is grossly unremarkable. No mediastinal fluid collection. Lungs/Pleura: Bibasilar subpleural atelectasis/scarring. Small left upper lobe calcified granuloma. No consolidative changes. There is no pleural effusion or pneumothorax. The central airways are patent. Musculoskeletal: Osteopenia with degenerative changes of the spine. No acute osseous pathology. CT ABDOMEN PELVIS FINDINGS No intra-abdominal free air or free fluid. Hepatobiliary: Multiple small liver cysts and additional subcentimeter hypodense lesions which are too small to characterize. There is mild biliary ductal dilatation. There is tumefactive sludge and stones within the gallbladder. Probable noncalcified stone in the CBD. Further evaluation with ultrasound or MRCP is recommended. No pericholecystic fluid or  evidence of acute cholecystitis by CT. Pancreas: The pancreas is unremarkable. Spleen: Normal in size without focal abnormality. Adrenals/Urinary Tract: The adrenal glands are unremarkable. There is no hydronephrosis on either side. There is symmetric enhancement and excretion of contrast by both kidneys. The urinary bladder is mildly distended despite presence of a Foley catheter. Air within the bladder introduced  via the catheter. There is trabeculated appearance of the bladder wall which may be related to chronic bladder dysfunction. 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 There is large amount of dense stool throughout the colon there is no bowel obstruction or active inflammation. The appendix is normal. Vascular/Lymphatic: Moderate aortoiliac atherosclerotic disease. An infrarenal IVC filter is present. No portal venous gas. There is no adenopathy. Reproductive: The uterus is retroverted.  No adnexal masses. Other: Mild subcutaneous stranding of the pelvis. Musculoskeletal: Osteopenia with scoliosis and degenerative changes. Evidence of prior posterior lumbar fusion. No acute osseous pathology. IMPRESSION: 1. No acute intrathoracic pathology. 2. Cholelithiasis and choledocholithiasis with mild dilatation of the biliary tree. Further evaluation with ultrasound or MRCP is recommended. 3. Constipation. No bowel obstruction. Normal appendix. 4.  Aortic Atherosclerosis (ICD10-I70.0). Electronically Signed   By: Elgie Collard M.D.   On: 01/26/2023 22:03   DG Chest Port 1 View  Result Date: 01/26/2023 CLINICAL DATA:  Questionable sepsis EXAM: PORTABLE CHEST 1 VIEW COMPARISON:  Chest x-ray 10/21/2020 FINDINGS: Loop recorder device overlies the left chest. The heart size and mediastinal contours are within normal limits. Both lungs are clear. The visualized skeletal structures are unremarkable. IMPRESSION: No active disease. Electronically Signed   By: Darliss Cheney M.D.    On: 01/26/2023 20:58    Procedures Procedures    CRITICAL CARE Performed by: Richardean Canal   Total critical care time: 31 minutes  Critical care time was exclusive of separately billable procedures and treating other patients.  Critical care was necessary to treat or prevent imminent or life-threatening deterioration.  Critical care was time spent personally by me on the following activities: development of treatment plan with patient and/or surrogate as well as nursing, discussions with consultants, evaluation of patient's response to treatment, examination of patient, obtaining history from patient or surrogate, ordering and performing treatments and interventions, ordering and review of laboratory studies, ordering and review of radiographic studies, pulse oximetry and re-evaluation of patient's condition.   Medications Ordered in ED Medications  lactated ringers infusion (has no administration in time range)  cefTRIAXone (ROCEPHIN) 2 g in sodium chloride 0.9 % 100 mL IVPB (0 g Intravenous Stopped 01/26/23 2044)  vancomycin (VANCOCIN) IVPB 1000 mg/200 mL premix (has no administration in time range)  lactated ringers bolus 1,000 mL (1,000 mLs Intravenous New Bag/Given 01/26/23 2033)    And  lactated ringers bolus 1,000 mL (1,000 mLs Intravenous New Bag/Given 01/26/23 2033)  acetaminophen (TYLENOL) suppository 650 mg (650 mg Rectal Given 01/26/23 2126)  iohexol (OMNIPAQUE) 300 MG/ML solution 100 mL (100 mLs Intravenous Contrast Given 01/26/23 2149)    ED Course/ Medical Decision Making/ A&P                             Medical Decision Making [name] is a 82 y.o. female here presenting with altered mental status and fever.  Patient is febrile on arrival.  Patient is more confused than her baseline.  Patient does have obvious sacral decub ulcers that may be infected.  Also consider UTI.  Will do sepsis workup with CBC and CMP and lactate and cultures and urinalysis.  10:31  PM Patient's white blood cell count is 20,000.  Her lactate is normal.  Patient's urinalysis is normal.  CT chest abdomen pelvis showed no obvious pneumonia or bowel obstruction or colitis.  Patient in particular does not have any fistula or osteomyelitis  from her sacral decub ulcer.  I think the likely source of her infection is from her sacral decub ulcer with cellulitis and possible left arm cellulitis.  Patient received broad-spectrum antibiotics.  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 Decision-making details documented in ED Course. Radiology: ordered and independent interpretation performed. Decision-making details documented in ED Course. ECG/medicine tests: ordered and independent interpretation performed. Decision-making details documented in ED Course.  Risk OTC drugs. Prescription drug management. Decision regarding hospitalization.    Final Clinical Impression(s) / ED Diagnoses Final diagnoses:  None    Rx / DC Orders ED Discharge Orders     None         Charlynne Pander, MD 01/26/23 2233    Charlynne Pander, MD 01/26/23 2252

## 2023-01-26 NOTE — ED Triage Notes (Signed)
Patient arrived after family states patient became more lethargic than normal, reports mentation at baseline. States history of UTIs. Declines symptoms at this time. Followed by wound care due to pressure ulcers.

## 2023-01-26 NOTE — H&P (Incomplete)
History and Physical    [name]:[id number] DOB: 1941-03-14 DOA: 01/26/2023  PCP: [name], MD   Chief Complaint: ams  HPI: [name] is a 82 y.o. female with medical history significant of hypertension, hyperlipidemia, prior stroke, depression who presented to the emergency department due to altered mental status.  Patient reportedly lives at home with family and has wound care that helps take care of her chronic wounds.  Family noted her to be more confused so brought her to the emergency department for further assessment.  On arrival she was notably altered and unable to specify where she was.  She endorsed headache but denied other complaints.  She had noticeable foul-smelling urine on presentation.  On arrival she was febrile to 101.7 and hypertensive with systolics in the 180s.  Labs were obtained which revealed WBC 20.9, platelets 127, INR 1.5.  CT chest abdomen pelvis was obtained which showed cholelithiasis and choledocholithiasis with mild dilation of the biliary tree with recommended ultrasound/MRCP.  She is also noted to be constipated.   Review of Systems: ROS   As per HPI otherwise 10 point review of systems negative.   Allergies  Allergen Reactions  . T-Pa [Alteplase] Swelling and Other (See Comments)    Does okay with it if pre-medicated with benadryl; limbs became swollen  . Penicillins   . Codeine Itching    Past Medical History:  Diagnosis Date  . Arthritis   . Asthma   . Chronic back pain   . Depression   . History of pulmonary embolus (PE)   . Hx-TIA (transient ischemic attack)   . Hyperlipemia   . Hypertension   . Personal history of DVT (deep vein thrombosis)   . Scoliosis   . Sleep apnea    cpap - settingsi at 2   . Stroke (HCC)   . 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 ARTHROSCOPIC REPAIR ACL  02/03/2004  . HARDWARE REMOVAL N/A 07/27/2020   Procedure: Lumbar Hardware  removal with lumbar wound debridement;  Surgeon: Barnett Abu, MD;  [facility] OR;  Service: Neurosurgery;  Laterality: N/A;  . IR GASTROSTOMY TUBE MOD SED  10/30/2020  . IR IVC FILTER PLMT / S&I /IMG GUID/MOD SED  05/10/2020  . LEFT HEART CATH AND CORONARY ANGIOGRAPHY N/A 09/16/2019   Procedure: LEFT HEART CATH AND CORONARY ANGIOGRAPHY;  Surgeon: Yvonne Kendall, MD;  Location: [facility] INVASIVE CV LAB;  Service: Cardiovascular;  Laterality: N/A;  . LOOP RECORDER INSERTION N/A 02/21/2020   Procedure: LOOP RECORDER INSERTION;  Surgeon: Duke Salvia, MD;  Location: [facility] INVASIVE CV LAB;  Service: Cardiovascular;  Laterality: N/A;  . LUMBAR WOUND DEBRIDEMENT N/A 07/27/2020   Procedure: LUMBAR WOUND DEBRIDEMENT;  Surgeon: Barnett Abu, MD;  Location: [facility] OR;  Service: Neurosurgery;  Laterality: N/A;  . OOPHORECTOMY  1970  . SPINE SURGERY  03/31/2006  . TOTAL KNEE ARTHROPLASTY Left 05/17/2016   Procedure: LEFT TOTAL KNEE ARTHROPLASTY;  Surgeon: Kathryne Hitch, MD;  Location: [facility] ORS;  Service: Orthopedics;  Laterality: Left;  Adductor Block; Spinal to General  . TUBAL LIGATION  1983     reports that she quit smoking about 49 years ago. Her smoking use included cigarettes. She has a 10.00 pack-year smoking history. She has never used smokeless tobacco. She reports that she does not drink alcohol and does not use drugs.  Family History  Problem Relation Age of Onset  . Other Father  kidney problems    Prior to Admission medications   Medication Sig Start Date End Date Taking? Authorizing Provider  mupirocin cream (BACTROBAN) 2 % Apply 1 Application topically 2 (two) times daily. 01/18/23  Yes [provider]  acetaminophen (TYLENOL) 325 MG tablet Take 2 tablets (650 mg total) by mouth every 4 (four) hours as needed for mild pain or moderate pain (or Fever >/= 101). 08/06/20   Lewie Chamber, MD  albuterol (VENTOLIN HFA) 108 (90 Base) MCG/ACT inhaler Inhale 2 puffs into the lungs  every 6 (six) hours as needed for wheezing or shortness of breath.    [provider]  atorvastatin (LIPITOR) 20 MG tablet Take 20 mg by mouth at bedtime. 09/15/20   [provider]  benzonatate (TESSALON) 100 MG capsule Take 100 mg by mouth 3 (three) times daily as needed for cough.    [provider]  busPIRone (BUSPAR) 30 MG tablet Take 15 mg by mouth 2 (two) times daily. 09/22/20   [provider]  cephALEXin (KEFLEX) 500 MG capsule Take 1 capsule (500 mg total) by mouth 4 (four) times daily. 02/04/22   Peter Garter, PA  cholecalciferol (VITAMIN D3) 25 MCG (1000 UNIT) tablet Take 1,000 Units by mouth daily.    [provider]  clotrimazole-betamethasone (LOTRISONE) cream Apply 1 Application topically See admin instructions. Apply to buttocks 2 times a day    [provider]  cycloSPORINE (RESTASIS) 0.05 % ophthalmic emulsion Place 1 drop into both eyes daily as needed (dry eyes). Patient taking differently: Place 1 drop into both eyes daily as needed (for redness). 02/21/20   Layne Benton, NP  ELIQUIS 5 MG TABS tablet Take 5 mg by mouth in the morning and at bedtime. 09/26/20   [provider]  feeding supplement (ENSURE ENLIVE / ENSURE PLUS) LIQD Take 237 mLs by mouth 3 (three) times daily between meals. 05/30/20   Swayze, Ava, DO  ferrous sulfate 325 (65 FE) MG tablet Take 1 tablet (325 mg total) by mouth daily with breakfast. 08/06/20   Lewie Chamber, MD  fexofenadine (ALLEGRA) 180 MG tablet Take 180 mg by mouth daily as needed for allergies or rhinitis.    [provider]  fluticasone (FLONASE) 50 MCG/ACT nasal spray Place 2 sprays into both nostrils daily as needed for allergies or rhinitis.    [provider]  furosemide (LASIX) 20 MG tablet Take 10 mg by mouth daily as needed for fluid or edema.    [provider]  gentamicin cream (GARAMYCIN) 0.1 % Apply 1 Application topically See admin  instructions. Apply around the feeding tube with every change and every morning with wound dressing change    [provider]  LINZESS 145 MCG CAPS capsule Take 145 mcg by mouth daily. 04/16/20   [provider]  lisinopril (ZESTRIL) 10 MG tablet Take 10 mg by mouth daily.    [provider]  memantine (NAMENDA XR) 28 MG CP24 24 hr capsule Take 1 capsule (28 mg total) by mouth daily. 03/09/20   Angiulli, Mcarthur Rossetti, PA-C  Multiple Vitamins-Minerals (CENTRUM SILVER 50+WOMEN) TABS Take 1 tablet by mouth in the morning.    [provider]  nitroGLYCERIN (NITROSTAT) 0.4 MG SL tablet Place 1 tablet (0.4 mg total) under the tongue every 5 (five) minutes as needed for chest pain. 09/17/19   Duke, Roe Rutherford, PA  Nutritional Supplements (NUTREN 1.5) LIQD     [provider]  nystatin powder Apply 1  Application topically See admin instructions. Apply to buttocks 2 times a day (after applying clotrimazole-betamethasone cream)    [provider]  OVER THE COUNTER MEDICATION 1 patch See admin instructions. Calcium Alginate with Antimicrobial Silver Sheet Dressing- Change dressing every morning (left "tunnel")    [provider]  Oxycodone HCl 20 MG TABS Take 20 mg by mouth See admin instructions. Take 20 mg by mouth every 4-6 hours as needed for pain; not to exceed 5 tablets in 24 hours    [provider]  polyethylene glycol (MIRALAX / GLYCOLAX) 17 g packet Take 17 g by mouth 2 (two) times daily. 03/09/20   Angiulli, Mcarthur Rossetti, PA-C  potassium chloride (KLOR-CON) 10 MEQ tablet Take 5 mEq by mouth See admin instructions. Take 5 mEq by mouth once a day only when taking Furosemide 09/19/20   [provider]  vitamin C (ASCORBIC ACID) 500 MG tablet Take 500 mg by mouth daily.    [provider]  zinc gluconate 50 MG tablet Take 50 mg by mouth daily.    [provider]  zolpidem (AMBIEN) 5 MG tablet Take 1 tablet (5 mg total) by  mouth at bedtime as needed for sleep (Insomnia). Patient not taking: No sig reported 05/30/20 08/05/20  Fran Lowes, DO    Physical Exam: Vitals:   01/26/23 2300 01/26/23 2315 01/26/23 2330 01/26/23 2345  BP: (!) 149/55 (!) 148/57 (!) 146/62 (!) 124/58  Pulse: 64 64 77 63  Resp: 17 19 15 13   Temp:      TempSrc:      SpO2: 94% 95% 96% 95%   Physical Exam ***    Labs on Admission: I have personally reviewed the patients's labs and imaging studies.  Assessment/Plan Principal Problem:   Decubitus ulcer    ***   Admission status: Inpatient Med-Surg  Certification: The appropriate patient status for this patient is INPATIENT. Inpatient status is judged to be reasonable and necessary in order to provide the required intensity of service to ensure the patient's safety. The patient's presenting symptoms, physical exam findings, and initial radiographic and laboratory data in the context of their chronic comorbidities is felt to place them at high risk for further clinical deterioration. Furthermore, it is not anticipated that the patient will be medically stable for discharge from the hospital within 2 midnights of admission.   * I certify that at the point of admission it is my clinical judgment that the patient will require inpatient hospital care spanning beyond 2 midnights from the point of admission due to high intensity of service, high risk for further deterioration and high frequency of surveillance required.Alan Mulder MD [facility] If 7PM-7AM, please contact night-coverage [email or web address]  01/26/2023, 11:58 PM

## 2023-01-27 ENCOUNTER — Inpatient Hospital Stay (HOSPITAL_COMMUNITY): Payer: Medicare Other

## 2023-01-27 DIAGNOSIS — A419 Sepsis, unspecified organism: Secondary | ICD-10-CM | POA: Diagnosis not present

## 2023-01-27 DIAGNOSIS — L0390 Cellulitis, unspecified: Secondary | ICD-10-CM | POA: Diagnosis not present

## 2023-01-27 LAB — BASIC METABOLIC PANEL
Anion gap: 4 — ABNORMAL LOW (ref 5–15)
BUN: 18 mg/dL (ref 8–23)
CO2: 27 mmol/L (ref 22–32)
Calcium: 8.3 mg/dL — ABNORMAL LOW (ref 8.9–10.3)
Chloride: 106 mmol/L (ref 98–111)
Creatinine, Ser: 0.38 mg/dL — ABNORMAL LOW (ref 0.44–1.00)
GFR, Estimated: >60 mL/min (ref >60)
Glucose, Bld: 85 mg/dL (ref 70–99)
Potassium: 3.6 mmol/L (ref 3.5–5.1)
Sodium: 137 mmol/L (ref 135–145)

## 2023-01-27 LAB — CBC
HCT: 38.5 % (ref 36.0–46.0)
Hemoglobin: 12.1 g/dL (ref 12.0–15.0)
MCH: 33 pg (ref 26.0–34.0)
MCHC: 31.4 g/dL (ref 30.0–36.0)
MCV: 104.9 fL — ABNORMAL HIGH (ref 80.0–100.0)
Platelets: 105 10*3/uL — ABNORMAL LOW (ref 150–400)
RBC: 3.67 MIL/uL — ABNORMAL LOW (ref 3.87–5.11)
RDW: 13.5 % (ref 11.5–15.5)
WBC: 16.2 10*3/uL — ABNORMAL HIGH (ref 4.0–10.5)
nRBC: 0 % (ref 0.0–0.2)

## 2023-01-27 LAB — GLUCOSE, CAPILLARY: Glucose-Capillary: 105 mg/dL — ABNORMAL HIGH (ref 70–99)

## 2023-01-27 LAB — LACTIC ACID, PLASMA: Lactic Acid, Venous: 1 mmol/L (ref 0.5–1.9)

## 2023-01-27 MED ORDER — VANCOMYCIN HCL IN DEXTROSE 1-5 GM/200ML-% IV SOLN: 1000.0000 mg | INTRAVENOUS | Status: DC

## 2023-01-27 MED ORDER — HYDROCERIN EX CREA
TOPICAL_CREAM | Freq: Two times a day (BID) | CUTANEOUS | Status: DC
  Filled 2023-01-27: qty 113

## 2023-01-27 MED ORDER — SODIUM CHLORIDE 0.9 % IV SOLN
2.0000 g | INTRAVENOUS | Status: DC
  Administered 2023-01-27 – 2023-01-29 (×3): 2 g via INTRAVENOUS
  Filled 2023-01-27 (×4): qty 20

## 2023-01-27 MED ORDER — CHLORHEXIDINE GLUCONATE CLOTH 2 % EX PADS
6.0000 | MEDICATED_PAD | Freq: Every day | CUTANEOUS | Status: DC
  Administered 2023-01-27 – 2023-01-30 (×4): 6 via TOPICAL

## 2023-01-27 MED ORDER — BUTALBITAL-APAP-CAFFEINE 50-325-40 MG PO TABS
1.0000 | ORAL_TABLET | Freq: Four times a day (QID) | ORAL | Status: DC | PRN
  Administered 2023-01-28: 1 via ORAL
  Filled 2023-01-27: qty 1

## 2023-01-27 MED ORDER — OSMOLITE 1.5 CAL PO LIQD
1000.0000 mL | ORAL | Status: DC
  Administered 2023-01-27 – 2023-01-29 (×3): 1000 mL
  Filled 2023-01-27 (×4): qty 1000

## 2023-01-27 MED ORDER — ADULT MULTIVITAMIN W/MINERALS CH
1.0000 | ORAL_TABLET | Freq: Every day | ORAL | Status: DC
  Administered 2023-01-27 – 2023-01-30 (×4): 1 via ORAL
  Filled 2023-01-27 (×4): qty 1

## 2023-01-27 MED ORDER — COVID-19 MRNA 2023-2024 VACCINE (COMIRNATY) 0.3 ML INJECTION
0.3000 mL | Freq: Once | INTRAMUSCULAR | Status: DC
  Filled 2023-01-27: qty 0.3

## 2023-01-27 MED ORDER — PROSOURCE TF20 ENFIT COMPATIBL EN LIQD
60.0000 mL | Freq: Three times a day (TID) | ENTERAL | Status: DC
  Administered 2023-01-27 – 2023-01-30 (×10): 60 mL
  Filled 2023-01-27 (×11): qty 60

## 2023-01-27 MED ORDER — MAGNESIUM CITRATE PO SOLN
1.0000 | Freq: Once | ORAL | Status: DC
  Filled 2023-01-27: qty 296

## 2023-01-27 MED ORDER — JUVEN PO PACK
1.0000 | PACK | Freq: Every morning | ORAL | Status: DC
  Administered 2023-01-28 – 2023-01-30 (×3): 1
  Filled 2023-01-27 (×3): qty 1

## 2023-01-27 MED ORDER — COVID-19 MRNA 2023-2024 VACCINE (COMIRNATY) 0.3 ML INJECTION
0.3000 mL | Freq: Once | INTRAMUSCULAR | Status: AC
  Administered 2023-01-27: 0.3 mL via INTRAMUSCULAR
  Filled 2023-01-27: qty 0.3

## 2023-01-27 NOTE — Progress Notes (Signed)
Pharmacy Antibiotic Note  [name] is a 82 y.o. female admitted on 01/26/2023 with AMS. Patient reportedly lives at home with family and has wound care that helps take care of her chronic wounds.  Pharmacy has been consulted for vancomycin dosing.n  1st dose given in the ED  Plan: Vancomycin 1gm IV q24h (aUC 499, Scr 0.8) Follow renal function and clinical course  Height: 5\' 5"  (165.1 cm) Weight: 62 kg (136 lb 11 oz) IBW/kg (Calculated) : 57  Temp (24hrs), Avg:99.8 F (37.7 C), Min:98.4 F (36.9 C), Max:101.7 F (38.7 C)  Recent Labs  Lab 01/26/23 2015  WBC 20.9*  CREATININE 0.59  LATICACIDVEN 1.4    Estimated Creatinine Clearance: 48.8 mL/min (by C-G formula based on SCr of 0.59 mg/dL).    Allergies  Allergen Reactions   T-Pa [Alteplase] Swelling and Other (See Comments)    Does okay with it if pre-medicated with benadryl; limbs became swollen   Penicillins    Codeine Itching    Antimicrobials this admission: 6/16 vanc >> 6/16 CTX >>  Dose adjustments this admission:   Microbiology results: 6/16 BCx:    Thank you for allowing pharmacy to be a part of this patient's care.  Arley Phenix RPh 01/27/2023, 12:59 AM

## 2023-01-27 NOTE — Progress Notes (Addendum)
Initial Nutrition Assessment  DOCUMENTATION CODES:   Non-severe (moderate) malnutrition in context of chronic illness  INTERVENTION:   Continue home tube feeding regimen via PEG: -Osmolite 1.5 @ 50 ml/hr x 12 hours (2000-0800) -60 ml Prosource TF 20 TID -Provides 1140 kcals, 97g protein and 457 ml H2O  -1 packet Juven daily via tube, each packet provides 95 calories, 2.5 grams of protein (collagen), and 9.8 grams of carbohydrate (3 grams sugar); also contains 7 grams of L-arginine and L-glutamine, 300 mg vitamin C, 15 mg vitamin E, 1.2 mcg vitamin B-12, 9.5 mg zinc, 200 mg calcium, and 1.5 g  Calcium Beta-hydroxy-Beta-methylbutyrate to support wound healing   -PO as tolerated, on dysphagia 3 diet  -Multivitamin with minerals daily, crushed in applesauce  NUTRITION DIAGNOSIS:   Moderate Malnutrition related to chronic illness, dysphagia (h/o CVA) as evidenced by mild fat depletion, moderate muscle depletion.  GOAL:   Patient will meet greater than or equal to 90% of their needs  MONITOR:   Labs, Weight trends, TF tolerance, I & O's, Skin  REASON FOR ASSESSMENT:   Consult Enteral/tube feeding initiation and management  ASSESSMENT:   82 y.o. female with medical history significant of hypertension, hyperlipidemia, prior stroke, depression who presented to the emergency department due to altered mental status.  Patient reportedly lives at home with family and has wound care that helps take care of her chronic wounds.  Patient in room, h/o dementia, alert/oriented x 1. Not able to give much history. Requests I call her son for tube feeding details. Currently sipping on juice.   Spoke with pt's son on the phone, Harvie Heck. Reports pt runs Nutren 1.5 @ 50 ml/hr x 12 hours from 9pm to 9am. He also provides Juven once daily and Prosource TID via tube. Tries to flush this really well to prevent clogging. Expresses concerns for how the tube looks and does not want this to cause an  infection. This was the originally placed tube per son and this has been used for 3 years. Documentation in chart states PEG was placed in 2022.  Per son, pt does eat sometimes at home. He will make her scrambled eggs. She likes yogurt and pudding as well. She takes vitamins PO, most crushed in applesauce. Takes a daily MVI, Vitamin D, Vitamin C and Zinc.   Explained to son that Nutren is not available on our formulary. The equivalent formula is Osmolite which he is okay with her having. States at home he has found it is slightly thicker than Nutren.   Per weight records, weight for this admission is the same as what was documented on 6/26. Daily weights will be ordered via TF protocol. Will monitor weight trends.   Medications reviewed.  Lab reviewed.   NUTRITION - FOCUSED PHYSICAL EXAM:  Flowsheet Row Most Recent Value  Orbital Region Mild depletion  Upper Arm Region Moderate depletion  [right side]  Thoracic and Lumbar Region Unable to assess  Buccal Region Mild depletion  Temple Region Mild depletion  Clavicle Bone Region Moderate depletion  Clavicle and Acromion Bone Region Moderate depletion  Scapular Bone Region Moderate depletion  Dorsal Hand Severe depletion  [right side]  Patellar Region Unable to assess  Anterior Thigh Region Unable to assess  Posterior Calf Region Unable to assess  Edema (RD Assessment) None  Hair Reviewed  Eyes Reviewed  Mouth Reviewed  [poor dentition]  Skin Reviewed  Lily Peer LUE]       Diet Order:   Diet Order  DIET DYS 3 Room service appropriate? Yes; Fluid consistency: Thin  Diet effective now                   EDUCATION NEEDS:   No education needs have been identified at this time  Skin:  Skin Assessment: Reviewed RN Assessment Chronic wounds: spine, sacrum, hands  Last BM:  PTA  Height:   Ht Readings from Last 1 Encounters:  01/27/23 5\' 5"  (1.651 m)    Weight:   Wt Readings from Last 1 Encounters:   01/27/23 62 kg    BMI:  Body mass index is 22.75 kg/m.  Estimated Nutritional Needs:   Kcal:  1550-1750  Protein:  75-95g  Fluid:  1.7L/day   Tilda Franco, MS, RD, LDN Inpatient Clinical Dietitian Contact information available via Amion

## 2023-01-27 NOTE — Evaluation (Signed)
Occupational Therapy Evaluation Patient Details Name: [name] MRN: [id number] DOB: Oct 13, 1940 Today's Date: 01/27/2023   History of Present Illness Patient is a 82 year old female who presented with AMS. 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 PMH: lumbar hardwear removal 2021, loop recorder, spinal surgery, L TKA 2017, stroke, takotsubo cardiomyopathy, scoliosis,   Clinical Impression   Patient is a 82 year old female who was admitted for above. Patient is mod A for self feeding at home per son report with patient using a tennis ball to maintain ROM in hand and prevent fisting. Patient would benefit from continued skilled OT services to establish wear time schedule with palm guard to reduce risk of skin breakdown and further contracture.       Recommendations for follow up therapy are one component of a multi-disciplinary discharge planning process, led by the attending physician.  Recommendations may be updated based on patient status, additional functional criteria and insurance authorization.   Assistance Recommended at Discharge Frequent or constant Supervision/Assistance  Patient can return home with the following Two people to help with walking and/or transfers;Two people to help with bathing/dressing/bathroom;Assistance with feeding;Direct supervision/assist for financial management;Help with stairs or ramp for entrance;Assist for transportation;Direct supervision/assist for medications management;Assistance with cooking/housework    Functional Status Assessment  Patient has had a recent decline in their functional status and/or demonstrates limited ability to make significant improvements in function in a reasonable and predictable amount of time  Equipment Recommendations  None recommended by OT       Precautions / Restrictions Precautions Precaution Comments: bedbound at home Restrictions Weight Bearing  Restrictions: No      Mobility Bed Mobility               General bed mobility comments: patient is TD for rolling in bed.                 ADL either performed or assessed with clinical judgement   ADL Overall ADL's : Needs assistance/impaired   Eating/Feeding Details (indicate cue type and reason): patient was in room with tray in front of her at start of session. patient attempting to use RUE to pick up spoon and take few bites of oat meal. patient was provided with red foam handles to make it easier to hold onto spoon. patient was able to pick up spoon easier with the increased size of handle. patient able to pick up toast and few pieces of fruit and bring to mouth with no spillage. son was educated via phone about built up utensils. son verbalized understanding. Grooming: Bed level;Total assistance   Upper Body Bathing: Bed level;Total assistance   Lower Body Bathing: Bed level;Total assistance   Upper Body Dressing : Bed level;Total assistance   Lower Body Dressing: Bed level;Total assistance     Toilet Transfer Details (indicate cue type and reason): patient is bed bound at home per son report. patient does not get up. patient has a turning schedule at home to assist with pain. nurse and MD made aware. Toileting- Clothing Manipulation and Hygiene: Bed level;Total assistance         General ADL Comments: patients son expressed concerns about color of patients feeding tube line. patients son was educated that his concerns were voiced to MD in room and MD plans to call him later on this date. patient's son was educated on plam guard v.s. tennis ball for prevention of fisting. patients son verbalized  understanding. MD and nurse in room made aware of recommendations for palm guard at this time. time trial to be completed on this date to establish wear time orders for nursing per MD request.      Pertinent Vitals/Pain Pain Assessment Pain Assessment: Faces Faces  Pain Scale: Hurts little more Pain Location: bottom with rolling and sitting upright in bed. Pain Descriptors / Indicators: Discomfort, Grimacing Pain Intervention(s): Monitored during session, Limited activity within patient's tolerance, Other (comment) (RN in room providing medications during session. MD in room as well)     Hand Dominance  (patient reported being L handed but only being able to use R hand "since coming to hospital" patients son reported patient had not use left hand in a long time)   Extremity/Trunk Assessment Upper Extremity Assessment Upper Extremity Assessment: LUE deficits/detail;RUE deficits/detail RUE Deficits / Details: patient was noted to have contractures of hand with fingers in flexion at MCPs, extension at PIPs and some flexion noted in DIPs. patient middle digit DIP noted to have a radial sway. patient still able to use pincer grasp to hold spoon to attempt to feed self. LUE Deficits / Details: patient was noted to have contractures of hand with fingers in flexion at MCPs, extension at PIPs and flexion noted in DIPs. patietn unable to oppose thumb with increased pain with attempts to touch. patietn noted to have tennis ball taped to wrist upon entrance to room. tape and ball were removed at this time. patient unable to move digits or tolerate much touch with redness noted on thenar eminence with MD made aware while in room. patient noted to have increased edema and redness in elbow area and upper arm. has a current diagnosis of cellulitis. LUE: Unable to fully assess due to pain LUE Coordination: decreased fine motor;decreased gross motor       Cervical / Trunk Assessment Cervical / Trunk Assessment: Kyphotic      Cognition Arousal/Alertness: Awake/alert Behavior During Therapy: Flat affect Overall Cognitive Status: History of cognitive impairments - at baseline       General Comments: patients son was called to obtain PLOF. patient reported " i dont  remember to questions when asked.                Home Living Family/patient expects to be discharged to:: Private residence Living Arrangements: Children Available Help at Discharge: Family;Available 24 hours/day Type of Home: House Home Access: Level entry     Home Layout: One level                   Additional Comments: patients son was called in room on this date with son reporting that patient was TD for ADLs at home with patient able to participate in self feeding some with RUE "if hungry enough". patients son reported " we have been taking care of mom for 10 years now and know what she needs".      Prior Functioning/Environment Prior Level of Function : Needs assist  Cognitive Assist : ADLs (cognitive);Mobility (cognitive)     Physical Assist : Mobility (physical);ADLs (physical) Mobility (physical): Bed mobility ADLs (physical): Feeding;Grooming;Bathing;Dressing;Toileting;IADLs            OT Problem List: Impaired UE functional use;Increased edema;Pain      OT Treatment/Interventions: Splinting;Patient/family education;Therapeutic activities    OT Goals(Current goals can be found in the care plan section) Acute Rehab OT Goals Patient Stated Goal: none stated OT Goal Formulation: With family Time For Goal Achievement: 02/10/23  Potential to Achieve Goals: Fair  OT Frequency: Min 1X/week       AM-PAC OT "6 Clicks" Daily Activity     Outcome Measure Help from another person eating meals?: A Lot Help from another person taking care of personal grooming?: Total Help from another person toileting, which includes using toliet, bedpan, or urinal?: Total Help from another person bathing (including washing, rinsing, drying)?: Total Help from another person to put on and taking off regular upper body clothing?: Total Help from another person to put on and taking off regular lower body clothing?: Total 6 Click Score: 7   End of Session Equipment Utilized  During Treatment: Other (comment) (palm gard) Nurse Communication: Other (comment) (self feeding A and palm guard.)  Activity Tolerance: Patient limited by pain Patient left: in bed;with call bell/phone within reach;with bed alarm set (supplied with touch pad call light with patient able to demonstrate ability to use)  OT Visit Diagnosis: Unsteadiness on feet (R26.81);Muscle weakness (generalized) (M62.81)                Time: 4540-9811 OT Time Calculation (min): 36 min Charges:  OT General Charges $OT Visit: 1 Visit OT Evaluation $OT Eval Low Complexity: 1 Low OT Treatments $Self Care/Home Management : 8-22 mins  Rosalio Loud, MS Acute Rehabilitation Department Office# [phone]   Selinda Flavin 01/27/2023, 10:58 AM

## 2023-01-27 NOTE — ED Notes (Signed)
ED TO INPATIENT HANDOFF REPORT  ED Nurse Name and Phone #: Claiborne Rigg Name/Age/Gender [name] 82 y.o. female Room/Bed: WA14/WA14  Code Status   Code Status: DNR  Home/SNF/Other Home Patient oriented to: self and situation Is this baseline? Yes   Triage Complete: Triage complete  Chief Complaint Decubitus ulcer [L89.90]  Triage Note Patient arrived after family states patient became more lethargic than normal, reports mentation at baseline. States history of UTIs. Declines symptoms at this time. Followed by wound care due to pressure ulcers.    Allergies Allergies  Allergen Reactions   T-Pa [Alteplase] Swelling and Other (See Comments)    Does okay with it if pre-medicated with benadryl; limbs became swollen   Penicillins    Codeine Itching    Level of Care/Admitting Diagnosis ED Disposition     ED Disposition  Admit   Condition  --   Comment  Hospital Area: [facility] [[id number]]  Level of Care: Med-Surg [16]  May admit patient to [facility] or [facility] if equivalent level of care is available:: Yes  Covid Evaluation: Asymptomatic - no recent exposure (last 10 days) testing not required  Diagnosis: Decubitus ulcer [296503]  Admitting Physician: Alan Mulder [[id number]]  Attending Physician: Alan Mulder [[id number]]  Certification:: I certify this patient will need inpatient services for at least 2 midnights  Estimated Length of Stay: 2          B Medical/Surgery History Past Medical History:  Diagnosis Date   Arthritis    Asthma    Chronic back pain    Depression    History of pulmonary embolus (PE)    Hx-TIA (transient ischemic attack)    Hyperlipemia    Hypertension    Personal history of DVT (deep vein thrombosis)    Scoliosis    Sleep apnea    cpap - settingsi at 2    Stroke [facility])    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   Past Surgical History:  Procedure Laterality Date   ARTHROSCOPIC REPAIR ACL   02/03/2004   HARDWARE REMOVAL N/A 07/27/2020   Procedure: Lumbar Hardware removal with lumbar wound debridement;  Surgeon: Barnett Abu, MD;  Location: [facility] OR;  Service: Neurosurgery;  Laterality: N/A;   IR GASTROSTOMY TUBE MOD SED  10/30/2020   IR IVC FILTER PLMT / S&I /IMG GUID/MOD SED  05/10/2020   LEFT HEART CATH AND CORONARY ANGIOGRAPHY N/A 09/16/2019   Procedure: LEFT HEART CATH AND CORONARY ANGIOGRAPHY;  Surgeon: Yvonne Kendall, MD;  Location: [facility] INVASIVE CV LAB;  Service: Cardiovascular;  Laterality: N/A;   LOOP RECORDER INSERTION N/A 02/21/2020   Procedure: LOOP RECORDER INSERTION;  Surgeon: Duke Salvia, MD;  Location: [facility] INVASIVE CV LAB;  Service: Cardiovascular;  Laterality: N/A;   LUMBAR WOUND DEBRIDEMENT N/A 07/27/2020   Procedure: LUMBAR WOUND DEBRIDEMENT;  Surgeon: Barnett Abu, MD;  Location: [facility] OR;  Service: Neurosurgery;  Laterality: N/A;   OOPHORECTOMY  1970   SPINE SURGERY  03/31/2006   TOTAL KNEE ARTHROPLASTY Left 05/17/2016   Procedure: LEFT TOTAL KNEE ARTHROPLASTY;  Surgeon: Kathryne Hitch, MD;  Location: [facility] ORS;  Service: Orthopedics;  Laterality: Left;  Adductor Block; Spinal to General   TUBAL LIGATION  1983     A IV Location/Drains/Wounds Patient Lines/Drains/Airways Status     Active Line/Drains/Airways     Name Placement date Placement time Site Days   Peripheral IV 01/26/23 20 G Left Forearm 01/26/23  2011  Forearm  1  Urethral Catheter Pidi, Julienne NT Latex 14 Fr. 01/26/23  2045  Latex  1   Pressure Injury 10/19/20 Vertebral column Left;Mid Unstageable - Full thickness tissue loss in which the base of the injury is covered by slough (yellow, tan, gray, green or brown) and/or eschar (tan, brown or black) in the wound bed. eschar d/t removal  10/19/20  2020  -- 830   Pressure Injury 10/19/20 Buttocks Left Stage 4 - Full thickness tissue loss with exposed bone, tendon or muscle. 7x2x3, tunneling at 12'oclock-6cm, 9'oclock-1.5cm. 10/19/20  2020  --  830   Pressure Injury 10/19/20 Buttocks Mid Stage 3 -  Full thickness tissue loss. Subcutaneous fat may be visible but bone, tendon or muscle are NOT exposed. intragluteal cluster, 8.5x5 10/19/20  2020  -- 830   Pressure Injury 11/05/20 Labia Right;Left Stage 3 -  Full thickness tissue loss. Subcutaneous fat may be visible but bone, tendon or muscle are NOT exposed. open skin on labia 11/05/20  2230  -- 813   Wound / Incision (Open or Dehisced) 05/27/20 (MASD) Moisture Associated Skin Damage Perineum Right;Left 05/27/20  0610  Perineum  975            Intake/Output Last 24 hours  Intake/Output Summary (Last 24 hours) at 01/27/2023 0109 Last data filed at 01/26/2023 2329 Gross per 24 hour  Intake 2300 ml  Output --  Net 2300 ml    Labs/Imaging Results for orders placed or performed during the hospital encounter of 01/26/23 (from the past 48 hour(s))  Lactic acid, plasma     Status: None   Collection Time: 01/26/23  8:15 PM  Result Value Ref Range   Lactic Acid, Venous 1.4 0.5 - 1.9 mmol/L    Comment: Performed at [facility], [name], [place], [place] [address]  Comprehensive metabolic panel     Status: Abnormal   Collection Time: 01/26/23  8:15 PM  Result Value Ref Range   Sodium 137 135 - 145 mmol/L   Potassium 3.9 3.5 - 5.1 mmol/L   Chloride 103 98 - 111 mmol/L   CO2 27 22 - 32 mmol/L   Glucose, Bld 114 (H) 70 - 99 mg/dL    Comment: Glucose reference range applies only to samples taken after fasting for at least 8 hours.   BUN 22 8 - 23 mg/dL   Creatinine, Ser 5.64 0.44 - 1.00 mg/dL   Calcium 8.7 (L) 8.9 - 10.3 mg/dL   Total Protein 6.5 6.5 - 8.1 g/dL   Albumin 2.7 (L) 3.5 - 5.0 g/dL   AST 24 15 - 41 U/L   ALT 21 0 - 44 U/L   Alkaline Phosphatase 55 38 - 126 U/L   Total Bilirubin 1.2 0.3 - 1.2 mg/dL   GFR, Estimated >33 >29 mL/min    Comment: (NOTE) Calculated using the CKD-EPI Creatinine Equation (2021)    Anion gap 7 5 - 15    Comment:  Performed at [facility], [address] [place], [place] [address]  CBC with Differential     Status: Abnormal   Collection Time: 01/26/23  8:15 PM  Result Value Ref Range   WBC 20.9 (H) 4.0 - 10.5 K/uL   RBC 4.05 3.87 - 5.11 MIL/uL   Hemoglobin 13.5 12.0 - 15.0 g/dL   HCT 16.6 06.3 - 01.6 %   MCV 103.7 (H) 80.0 - 100.0 fL   MCH 33.3 26.0 - 34.0 pg   MCHC 32.1 30.0 - 36.0  g/dL   RDW 16.1 09.6 - 04.5 %   Platelets 127 (L) 150 - 400 K/uL   nRBC 0.0 0.0 - 0.2 %   Neutrophils Relative % 89 %   Neutro Abs 18.6 (H) 1.7 - 7.7 K/uL   Lymphocytes Relative 5 %   Lymphs Abs 1.1 0.7 - 4.0 K/uL   Monocytes Relative 5 %   Monocytes Absolute 1.0 0.1 - 1.0 K/uL   Eosinophils Relative 0 %   Eosinophils Absolute 0.0 0.0 - 0.5 K/uL   Basophils Relative 0 %   Basophils Absolute 0.0 0.0 - 0.1 K/uL   Immature Granulocytes 1 %   Abs Immature Granulocytes 0.14 (H) 0.00 - 0.07 K/uL    Comment: Performed at [facility], [address], [place], [place] [address]  Protime-INR     Status: Abnormal   Collection Time: 01/26/23  8:15 PM  Result Value Ref Range   Prothrombin Time 18.2 (H) 11.4 - 15.2 seconds   INR 1.5 (H) 0.8 - 1.2    Comment: (NOTE) INR goal varies based on device and disease states. Performed at [facility], [address] [place], [place] [address]   APTT     Status: None   Collection Time: 01/26/23  8:15 PM  Result Value Ref Range   aPTT 33 24 - 36 seconds    Comment: Performed at [facility], [address], [place], [place] [address]  Urinalysis, w/ Reflex to Culture (Infection Suspected) -Urine, Catheterized     Status: Abnormal   Collection Time: 01/26/23  8:40 PM  Result Value Ref Range   Specimen Source URINE, CATHETERIZED    Color, Urine YELLOW YELLOW   APPearance CLEAR CLEAR   Specific Gravity, Urine 1.015 1.005 - 1.030   pH 8.0 5.0 - 8.0   Glucose, UA NEGATIVE NEGATIVE mg/dL   Hgb urine  dipstick NEGATIVE NEGATIVE   Bilirubin Urine NEGATIVE NEGATIVE   Ketones, ur NEGATIVE NEGATIVE mg/dL   Protein, ur NEGATIVE NEGATIVE mg/dL   Nitrite NEGATIVE NEGATIVE   Leukocytes,Ua NEGATIVE NEGATIVE   RBC / HPF 0-5 0 - 5 RBC/hpf   WBC, UA 6-10 0 - 5 WBC/hpf    Comment:        Reflex urine culture not performed if WBC <=10, OR if Squamous epithelial cells >5. If Squamous epithelial cells >5 suggest recollection.    Bacteria, UA RARE (A) NONE SEEN   Squamous Epithelial / HPF 0-5 0 - 5 /HPF   Mucus PRESENT     Comment: Performed at [facility], [address] [place], [place] [address]  Resp panel by RT-PCR (RSV, Flu A&B, Covid) Anterior Nasal Swab     Status: None   Collection Time: 01/26/23  9:03 PM   Specimen: Anterior Nasal Swab  Result Value Ref Range   SARS Coronavirus 2 by RT PCR NEGATIVE NEGATIVE    Comment: (NOTE) SARS-CoV-2 target nucleic acids are NOT DETECTED.  The SARS-CoV-2 RNA is generally detectable in upper respiratory specimens during the acute phase of infection. The lowest concentration of SARS-CoV-2 viral copies this assay can detect is 138 copies/mL. A negative result does not preclude SARS-Cov-2 infection and should not be used as the sole basis for treatment or other patient management decisions. A negative result may occur with  improper specimen collection/handling, submission of specimen other than nasopharyngeal swab, presence of viral mutation(s) within the areas targeted by this assay, and inadequate number of viral copies(<138 copies/mL).  A negative result must be combined with clinical observations, patient history, and epidemiological information. The expected result is Negative.  Fact Sheet for Patients:  BloggerCourse.com  Fact Sheet for Healthcare Providers:  SeriousBroker.it  This test is no t yet approved or cleared by the [place] FDA and  has been authorized  for detection and/or diagnosis of SARS-CoV-2 by FDA under an Emergency Use Authorization (EUA). This EUA will remain  in effect (meaning this test can be used) for the duration of the COVID-19 declaration under Section 564(b)(1) of the Act, 21 U.S.C.section 360bbb-3(b)(1), unless the authorization is terminated  or revoked sooner.       Influenza A by PCR NEGATIVE NEGATIVE   Influenza B by PCR NEGATIVE NEGATIVE    Comment: (NOTE) The Xpert Xpress SARS-CoV-2/FLU/RSV plus assay is intended as an aid in the diagnosis of influenza from Nasopharyngeal swab specimens and should not be used as a sole basis for treatment. Nasal washings and aspirates are unacceptable for Xpert Xpress SARS-CoV-2/FLU/RSV testing.  Fact Sheet for Patients: BloggerCourse.com  Fact Sheet for Healthcare Providers: SeriousBroker.it  This test is not yet approved or cleared by the [place] FDA and has been authorized for detection and/or diagnosis of SARS-CoV-2 by FDA under an Emergency Use Authorization (EUA). This EUA will remain in effect (meaning this test can be used) for the duration of the COVID-19 declaration under Section 564(b)(1) of the Act, 21 U.S.C. section 360bbb-3(b)(1), unless the authorization is terminated or revoked.     Resp Syncytial Virus by PCR NEGATIVE NEGATIVE    Comment: (NOTE) Fact Sheet for Patients: BloggerCourse.com  Fact Sheet for Healthcare Providers: SeriousBroker.it  This test is not yet approved or cleared by the [place] FDA and has been authorized for detection and/or diagnosis of SARS-CoV-2 by FDA under an Emergency Use Authorization (EUA). This EUA will remain in effect (meaning this test can be used) for the duration of the COVID-19 declaration under Section 564(b)(1) of the Act, 21 U.S.C. section 360bbb-3(b)(1), unless the authorization is terminated  or revoked.  Performed at [facility], [address], [place], [place] [address]    CT CHEST ABDOMEN PELVIS W CONTRAST  Result Date: 01/26/2023 CLINICAL DATA:  Sepsis.  Altered mental status. EXAM: CT CHEST, ABDOMEN, AND PELVIS WITH CONTRAST TECHNIQUE: Multidetector CT imaging of the chest, abdomen and pelvis was performed following the standard protocol during bolus administration of intravenous contrast. RADIATION DOSE REDUCTION: This exam was performed according to the departmental dose-optimization program which includes automated exposure control, adjustment of the mA and/or kV according to patient size and/or use of iterative reconstruction technique. CONTRAST:  OMNIPAQUE IOHEXOL 300 MG/ML  SOLN COMPARISON:  CT abdomen pelvis dated 11/28/2021. FINDINGS: CT CHEST FINDINGS Cardiovascular: There is no cardiomegaly or pericardial effusion. There is moderate atherosclerotic calcification of the thoracic aorta. No aneurysmal dilatation or dissection. The aorta is tortuous. The central pulmonary arteries appear patent. Mediastinum/Nodes: No hilar or mediastinal adenopathy. The esophagus is grossly unremarkable. No mediastinal fluid collection. Lungs/Pleura: Bibasilar subpleural atelectasis/scarring. Small left upper lobe calcified granuloma. No consolidative changes. There is no pleural effusion or pneumothorax. The central airways are patent. Musculoskeletal: Osteopenia with degenerative changes of the spine. No acute osseous pathology. CT ABDOMEN PELVIS FINDINGS No intra-abdominal free air or free fluid. Hepatobiliary: Multiple small liver cysts and additional subcentimeter hypodense lesions which are too small to characterize. There is mild biliary ductal dilatation. There is tumefactive sludge and stones within the gallbladder. Probable noncalcified stone  in the CBD. Further evaluation with ultrasound or MRCP is recommended. No pericholecystic fluid or evidence of acute  cholecystitis by CT. Pancreas: The pancreas is unremarkable. Spleen: Normal in size without focal abnormality. Adrenals/Urinary Tract: The adrenal glands are unremarkable. There is no hydronephrosis on either side. There is symmetric enhancement and excretion of contrast by both kidneys. The urinary bladder is mildly distended despite presence of a Foley catheter. Air within the bladder introduced via the catheter. There is trabeculated appearance of the bladder wall which may be related to chronic bladder dysfunction. 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 There is large amount of dense stool throughout the colon there is no bowel obstruction or active inflammation. The appendix is normal. Vascular/Lymphatic: Moderate aortoiliac atherosclerotic disease. An infrarenal IVC filter is present. No portal venous gas. There is no adenopathy. Reproductive: The uterus is retroverted.  No adnexal masses. Other: Mild subcutaneous stranding of the pelvis. Musculoskeletal: Osteopenia with scoliosis and degenerative changes. Evidence of prior posterior lumbar fusion. No acute osseous pathology. IMPRESSION: 1. No acute intrathoracic pathology. 2. Cholelithiasis and choledocholithiasis with mild dilatation of the biliary tree. Further evaluation with ultrasound or MRCP is recommended. 3. Constipation. No bowel obstruction. Normal appendix. 4.  Aortic Atherosclerosis (ICD10-I70.0). Electronically Signed   By: Elgie Collard M.D.   On: 01/26/2023 22:03   DG Chest Port 1 View  Result Date: 01/26/2023 CLINICAL DATA:  Questionable sepsis EXAM: PORTABLE CHEST 1 VIEW COMPARISON:  Chest x-ray 10/21/2020 FINDINGS: Loop recorder device overlies the left chest. The heart size and mediastinal contours are within normal limits. Both lungs are clear. The visualized skeletal structures are unremarkable. IMPRESSION: No active disease. Electronically Signed   By: Darliss Cheney M.D.   On: 01/26/2023  20:58    Pending Labs Unresulted Labs (From admission, onward)     Start     Ordered   01/27/23 0500  Basic metabolic panel  Tomorrow morning,   R        01/26/23 2351   01/27/23 0500  CBC  Tomorrow morning,   R        01/26/23 2351   01/26/23 1959  Lactic acid, plasma  (Septic presentation on arrival (screening labs, nursing and treatment orders for obvious sepsis))  STAT Now then every 2 hours,   R (with STAT occurrences)      01/26/23 1959   01/26/23 1959  Blood Culture (routine x 2)  (Septic presentation on arrival (screening labs, nursing and treatment orders for obvious sepsis))  BLOOD CULTURE X 2,   STAT      01/26/23 1959            Vitals/Pain Today's Vitals   01/26/23 2330 01/26/23 2345 01/27/23 0000 01/27/23 0045  BP: (!) 146/62 (!) 124/58  (!) 128/53  Pulse: 77 63  (!) 55  Resp: 15 13  12   Temp:      TempSrc:      SpO2: 96% 95%  97%  Weight:   62 kg   Height:   5\' 5"  (1.651 m)   PainSc:        Isolation Precautions Airborne and Contact precautions  Medications Medications  lactated ringers infusion ( Intravenous New Bag/Given 01/26/23 2230)  cefTRIAXone (ROCEPHIN) 2 g in sodium chloride 0.9 % 100 mL IVPB (0 g Intravenous Stopped 01/26/23 2044)  atorvastatin (LIPITOR) tablet 20 mg (has no administration in time range)  lisinopril (ZESTRIL) tablet 10 mg (has no administration in time range)  busPIRone (BUSPAR) tablet 15 mg (has no administration in time range)  memantine (NAMENDA XR) 24 hr capsule 28 mg (has no administration in time range)  linaclotide (LINZESS) capsule 145 mcg (has no administration in time range)  apixaban (ELIQUIS) tablet 5 mg (has no administration in time range)  benzonatate (TESSALON) capsule 100 mg (has no administration in time range)  cycloSPORINE (RESTASIS) 0.05 % ophthalmic emulsion 1 drop (has no administration in time range)  acetaminophen (TYLENOL) tablet 650 mg (has no administration in time range)    Or  acetaminophen  (TYLENOL) suppository 650 mg (has no administration in time range)  HYDROcodone-acetaminophen (NORCO/VICODIN) 5-325 MG per tablet 1-2 tablet (has no administration in time range)  polyethylene glycol (MIRALAX / GLYCOLAX) packet 17 g (has no administration in time range)  ondansetron (ZOFRAN) tablet 4 mg (has no administration in time range)    Or  ondansetron (ZOFRAN) injection 4 mg (has no administration in time range)  magnesium citrate solution 1 Bottle (has no administration in time range)  vancomycin (VANCOCIN) IVPB 1000 mg/200 mL premix (has no administration in time range)  lactated ringers bolus 1,000 mL (0 mLs Intravenous Stopped 01/26/23 2133)    And  lactated ringers bolus 1,000 mL (0 mLs Intravenous Stopped 01/26/23 2133)  acetaminophen (TYLENOL) suppository 650 mg (650 mg Rectal Given 01/26/23 2126)  vancomycin (VANCOCIN) IVPB 1000 mg/200 mL premix (0 mg Intravenous Stopped 01/26/23 2329)  iohexol (OMNIPAQUE) 300 MG/ML solution 100 mL (100 mLs Intravenous Contrast Given 01/26/23 2149)    Mobility non-ambulatory     Focused Assessments Pt came in by ambulance for increased lethargy, weakness and fever. Septic work up done. Has hx of UTIs, left arm is red and warm which pt's son states is new. Pt has feeding tube   R Recommendations: See Admitting Provider Note  Report given to:   Additional Notes: n/a

## 2023-01-27 NOTE — TOC Initial Note (Signed)
Transition of Care [facility]) - Initial/Assessment Note    Patient Details  Name: [name] MRN: [id number] Date of Birth: 1940/09/23  Transition of Care [facility]) CM/SW Contact:    Otelia Santee, LCSW Phone Number: 01/27/2023, 1:51 PM  Clinical Narrative:                 Spoke with pt's son via t/c. Pt resides with son and DIL. Pt is bed bound and total care at baseline. [name] has been caring for his mother since his father/pt's spouse passed away in 02-23-15. [name] denies DME needs and states they have everything they need at home. Pt has a wound care nurse who comes to the house every Wednesday and is able to come as needed on other days. Pt will require ambulance transport home.  TOC will continue to follow for discharge needs.   Expected Discharge Plan: Home/Self Care Barriers to Discharge: Continued Medical Work up   Patient Goals and CMS Choice Patient states their goals for this hospitalization and ongoing recovery are:: For pt to return home CMS Medicare.gov Compare Post Acute Care list provided to:: Patient Represenative (must comment) Choice offered to / list presented to : [facility] POA / Guardian [place] ownership interest in [facility].provided to:: [facility] POA / Guardian    Expected Discharge Plan and Services In-house Referral: NA Discharge Planning Services: NA Post Acute Care Choice: Resumption of Svcs/PTA Provider Living arrangements for the past 2 months: Single Family Home                 DME Arranged: N/A DME Agency: NA                  Prior Living Arrangements/Services Living arrangements for the past 2 months: Single Family Home Lives with:: Adult Children Patient language and need for interpreter reviewed:: Yes Do you feel safe going back to the place where you live?: Yes      Need for Family Participation in Patient Care: Yes (Comment) Care giver support system in place?: Yes (comment) Current home services: DME Criminal  Activity/Legal Involvement Pertinent to Current Situation/Hospitalization: No - Comment as needed  Activities of Daily Living      Permission Sought/Granted   Permission granted to share information with : No              Emotional Assessment Appearance:: Appears stated age Attitude/Demeanor/Rapport: Unable to Assess Affect (typically observed): Unable to Assess Orientation: : Oriented to Self Alcohol / Substance Use: Not Applicable Psych Involvement: No (comment)  Admission diagnosis:  Decubitus ulcer [L89.90] Left arm cellulitis [L03.114] Pressure injury of right buttock, 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   Hardware complicating wound infection (HCC) 07/23/2020   Back pain    Pressure injury of skin    Hypocalcemia 07/21/2020   Palliative care by specialist    Goals of care, counseling/discussion    Protein-calorie malnutrition, severe 05/25/2020   Acute renal failure (ARF) (HCC) 05/24/2020   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) 05/24/2020   Hyperkalemia    Edema of left upper extremity  Hematoma    Prolonged Q-T interval on ECG    Acute blood loss anemia    Transaminitis    Chronic bilateral low back pain without sciatica    Debility 05/13/2020   Cellulitis of right hip 05/10/2020   Elevated troponin 05/05/2020   Near syncope 05/05/2020   Pulmonary embolism (HCC) 05/05/2020   Macrocytic anemia    Hypokalemia    Labile blood pressure    Drug-induced hypotension    First degree AV block    Sleep disturbance    Drug induced constipation     Stage 2 chronic kidney disease    Dysphagia, post-stroke    Benign essential HTN    Slow transit constipation    Hypoalbuminemia due to protein-calorie malnutrition (HCC)    Allergic reaction caused by a drug (tPA w/ lip swelling) 02/21/2020   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   Dementia (HCC) 02/21/2020   Bradycardia 02/21/2020   AKI (acute kidney injury) (HCC) 02/21/2020   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   Dyslipidemia    History of DVT (deep vein thrombosis)    OSA (obstructive sleep apnea)    Leukocytosis    Stroke (HCC) L>R pontine and L cerebellar embolic infarcts, source unknown s/p tPA 02/16/2020   Hx-TIA (transient ischemic attack)    Hyperlipemia    Hypertension    Sleep apnea    History of pulmonary embolus (PE)    NSTEMI (non-ST elevated myocardial infarction) (HCC) 09/16/2019   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   Depression 07/29/2018   Acute massive pulmonary embolism (HCC) 09/29/2016   Acute encephalopathy 09/28/2016   Left knee pain 09/28/2016   Right leg swelling 09/28/2016   Hilar mass 09/28/2016   OSA on CPAP 09/28/2016   HTN (hypertension) 09/28/2016   Chronic pain 09/28/2016   Cellulitis of left leg 06/14/2016   Osteoarthritis of left knee 05/17/2016   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   PCP:  [name], MD Pharmacy:   [facility] [id number] - [place], [place] - [address] AT [address] [place] [address] Phone: [phone] Fax: [phone]     Social Determinants of Health (SDOH) Social History: SDOH Screenings   Tobacco Use: Medium Risk (01/26/2023)   SDOH Interventions:     Readmission Risk Interventions    01/27/2023    1:49 PM  Readmission Risk Prevention Plan  Transportation Screening Complete  PCP or Specialist Appt within 5-7 Days Complete  Home Care Screening Complete  Medication Review (RN CM) Complete

## 2023-01-27 NOTE — Progress Notes (Signed)
PROGRESS NOTE    [name]  EXB:[id number] DOB: [birth year] DOA: 01/26/2023 PCP: [name], MD     Brief Narrative:  [name] is a 82 y.o. female with medical history significant of hypertension, hyperlipidemia, prior stroke, depression who presented to the emergency department due to altered mental status.  Patient reportedly lives at home with family and has wound care that helps take care of her chronic wounds.  Family noted her to be more confused so brought her to the emergency department for further assessment.  On arrival she was notably altered and unable to specify where she was.  She endorsed headache but denied other complaints.  She had noticeable foul-smelling urine on presentation.  On arrival she was febrile to 101.7 and hypertensive with systolics in the 180s.  Labs were obtained which revealed WBC 20.9, platelets 127, INR 1.5.  CT chest abdomen pelvis was obtained which showed cholelithiasis and choledocholithiasis with mild dilation of the biliary tree with recommended ultrasound/MRCP.  She is also noted to be constipated.  Examination revealed erythematous sacral decubitus ulcers.  Patient was admitted for further workup.  She was started on vancomycin and ceftriaxone.  On admission she was unable to contribute to her presentation.  She did not know her name or where she was.  She endorsed headache.  New events last 24 hours / Subjective: Patient being evaluated by OT.  Has contractures.  Per son, patient is bedbound and completely dependent on ADLs.  Assessment & Plan:   Principal Problem:   Sepsis due to cellulitis [facility]) Active Problems:   Acute encephalopathy   HTN (hypertension)   Depression   Hyperlipemia   Dementia (HCC)   Benign essential HTN   Debility   Decubitus ulcer  Sepsis secondary to cellulitis left upper extremity -Sepsis present on admission with fever 101.7 and leukocytosis 20.9 -UTI is ruled out.  UA is unremarkable -Blood  cultures pending -Rocephin  Acute metabolic encephalopathy -Secondary to above -Dementia at baseline  Biliary ductal dilatation -Right upper quadrant ultrasound: Cholelithiasis.  No sonographic evidence of acute cholecystitis. Dilated CBD  -No abdominal pain -LFTs within normal limits  Dementia -Bedbound at baseline with contractures -Lives at home with son and daughter-in-law -[place]  Evaluation for PEG tube -Per son, they use tube feedings nightly.  Dietitian consulted for orders -Son would like PEG tubing to be evaluated while patient is at the hospital due to the color and ?leaking -IR consulted  Stage II decubitus ulcers, present on admission -Followed by outpatient wound clinic very closely -Does not seem to be acutely infected -Appreciate wound RN recommendation  Hyperlipidemia -Lipitor  Paroxysmal A-fib, Hx DVT and PE  -Eliquis  Hypertension -Lisinopril  GAD -BuSpar   DVT prophylaxis:  SCDs Start: 01/26/23 2347 apixaban (ELIQUIS) tablet 5 mg  Code Status: DNR Family Communication: Son over the phone  Disposition Plan: Home  Status is: Inpatient Remains inpatient appropriate because: IV antibiotics     Antimicrobials:  Anti-infectives (From admission, onward)    Start     Dose/Rate Route Frequency Ordered Stop   01/27/23 2200  vancomycin (VANCOCIN) IVPB 1000 mg/200 mL premix  Status:  Discontinued        1,000 mg 200 mL/hr over 60 Minutes Intravenous Every 24 hours 01/27/23 0056 01/27/23 1045   01/27/23 2000  cefTRIAXone (ROCEPHIN) 2 g in sodium chloride 0.9 % 100 mL IVPB        2 g 200 mL/hr over 30 Minutes Intravenous Every 24 hours  01/27/23 1045 02/02/23 1959   01/26/23 2130  vancomycin (VANCOCIN) IVPB 1000 mg/200 mL premix        1,000 mg 200 mL/hr over 60 Minutes Intravenous  Once 01/26/23 2129 01/26/23 2329   01/26/23 2000  cefTRIAXone (ROCEPHIN) 2 g in sodium chloride 0.9 % 100 mL IVPB  Status:  Discontinued        2 g 200 mL/hr over 30  Minutes Intravenous Every 24 hours 01/26/23 1959 01/27/23 1045        Objective: Vitals:   01/27/23 0145 01/27/23 0230 01/27/23 0647 01/27/23 1052  BP: (!) 123/52 (!) 139/57 (!) 135/49 (!) 120/56  Pulse: (!) 53 (!) 50 (!) 52 (!) 58  Resp: 13 16 16 17   Temp:  98.3 F (36.8 C) 97.8 F (36.6 C) 98.4 F (36.9 C)  TempSrc:  Oral Oral Oral  SpO2: 95% 97% 96% 94%  Weight:      Height:        Intake/Output Summary (Last 24 hours) at 01/27/2023 1232 Last data filed at 01/27/2023 0900 Gross per 24 hour  Intake 3248.62 ml  Output 750 ml  Net 2498.62 ml   Filed Weights   01/27/23 0000  Weight: 62 kg    Examination:  General exam: Appears calm and comfortable  Respiratory system: Clear to auscultation. Respiratory effort normal.  Cardiovascular system: S1 & S2 heard, RRR. No murmurs.  Gastrointestinal system: Abdomen is nondistended, soft , +PEG in place  Central nervous system: Alert  Skin:            Data Reviewed: I have personally reviewed following labs and imaging studies  CBC: Recent Labs  Lab 01/26/23 2015 01/27/23 0432  WBC 20.9* 16.2*  NEUTROABS 18.6*  --   HGB 13.5 12.1  HCT 42.0 38.5  MCV 103.7* 104.9*  PLT 127* 105*   Basic Metabolic Panel: Recent Labs  Lab 01/26/23 2015 01/27/23 0432  NA 137 137  K 3.9 3.6  CL 103 106  CO2 27 27  GLUCOSE 114* 85  BUN 22 18  CREATININE 0.59 0.38*  CALCIUM 8.7* 8.3*   GFR: Estimated Creatinine Clearance: 48.8 mL/min (A) (by C-G formula based on SCr of 0.38 mg/dL (L)). Liver Function Tests: Recent Labs  Lab 01/26/23 2015  AST 24  ALT 21  ALKPHOS 55  BILITOT 1.2  PROT 6.5  ALBUMIN 2.7*   No results for input(s): "LIPASE", "AMYLASE" in the last 168 hours. No results for input(s): "AMMONIA" in the last 168 hours. Coagulation Profile: Recent Labs  Lab 01/26/23 2015  INR 1.5*   Cardiac Enzymes: No results for input(s): "CKTOTAL", "CKMB", "CKMBINDEX", "TROPONINI" in the last 168 hours. BNP  (last 3 results) No results for input(s): "PROBNP" in the last 8760 hours. HbA1C: No results for input(s): "HGBA1C" in the last 72 hours. CBG: No results for input(s): "GLUCAP" in the last 168 hours. Lipid Profile: No results for input(s): "CHOL", "HDL", "LDLCALC", "TRIG", "CHOLHDL", "LDLDIRECT" in the last 72 hours. Thyroid Function Tests: No results for input(s): "TSH", "T4TOTAL", "FREET4", "T3FREE", "THYROIDAB" in the last 72 hours. Anemia Panel: No results for input(s): "VITAMINB12", "FOLATE", "FERRITIN", "TIBC", "IRON", "RETICCTPCT" in the last 72 hours. Sepsis Labs: Recent Labs  Lab 01/26/23 2015 01/27/23 0432  LATICACIDVEN 1.4 1.0    Recent Results (from the past 240 hour(s))  Blood Culture (routine x 2)     Status: None (Preliminary result)   Collection Time: 01/26/23  8:08 PM   Specimen: BLOOD  Result Value Ref  Range Status   Specimen Description   Final    BLOOD BLOOD LEFT FOREARM Performed at [facility], [address] [place], [place] [address]    Special Requests   Final    BOTTLES DRAWN AEROBIC AND ANAEROBIC Blood Culture adequate volume Performed at [facility], [address] [place], [place] [address]    Culture   Final    NO GROWTH < 12 HOURS Performed at [facility], [address], [place], [place] [address]    Report Status PENDING  Incomplete  Blood Culture (routine x 2)     Status: None (Preliminary result)   Collection Time: 01/26/23  8:08 PM   Specimen: BLOOD  Result Value Ref Range Status   Specimen Description   Final    BLOOD BLOOD RIGHT FOREARM Performed at [facility], [address], [place], [place] [address]    Special Requests   Final    BOTTLES DRAWN AEROBIC AND ANAEROBIC Blood Culture adequate volume Performed at [facility], [address], [place], [place] [address]    Culture   Final    NO GROWTH < 12 HOURS Performed at [facility], [address], [place], [place] [address]    Report Status PENDING  Incomplete  Resp panel by RT-PCR (RSV, Flu A&B, Covid) Anterior Nasal Swab     Status: None   Collection Time: 01/26/23  9:03 PM   Specimen: Anterior Nasal Swab  Result Value Ref Range Status   SARS Coronavirus 2 by RT PCR NEGATIVE NEGATIVE Final    Comment: (NOTE) SARS-CoV-2 target nucleic acids are NOT DETECTED.  The SARS-CoV-2 RNA is generally detectable in upper respiratory specimens during the acute phase of infection. The lowest concentration of SARS-CoV-2 viral copies this assay can detect is 138 copies/mL. A negative result does not preclude SARS-Cov-2 infection and should not be used as the sole basis for treatment or other patient management decisions. A negative result may occur with  improper specimen collection/handling, submission of specimen other than nasopharyngeal swab, presence of viral mutation(s) within the areas targeted by this assay, and inadequate number of viral copies(<138 copies/mL). A negative result must be combined with clinical observations, patient history, and epidemiological information. The expected result is Negative.  Fact Sheet for Patients:  BloggerCourse.com  Fact Sheet for Healthcare Providers:  SeriousBroker.it  This test is no t yet approved or cleared by the [place] FDA and  has been authorized for detection and/or diagnosis of SARS-CoV-2 by FDA under an Emergency Use Authorization (EUA). This EUA will remain  in effect (meaning this test can be used) for the duration of the COVID-19 declaration under Section 564(b)(1) of the Act, 21 U.S.C.section 360bbb-3(b)(1), unless the authorization is terminated  or revoked sooner.       Influenza A by PCR NEGATIVE NEGATIVE Final   Influenza B by PCR NEGATIVE NEGATIVE Final    Comment: (NOTE) The Xpert Xpress SARS-CoV-2/FLU/RSV plus assay is intended as an  aid in the diagnosis of influenza from Nasopharyngeal swab specimens and should not be used as a sole basis for treatment. Nasal washings and aspirates are unacceptable for Xpert Xpress SARS-CoV-2/FLU/RSV testing.  Fact Sheet for Patients: BloggerCourse.com  Fact Sheet for Healthcare Providers: SeriousBroker.it  This test is not yet approved or cleared by the [place] FDA and has been authorized for detection and/or diagnosis of SARS-CoV-2 by FDA under an Emergency Use Authorization (EUA). This  EUA will remain in effect (meaning this test can be used) for the duration of the COVID-19 declaration under Section 564(b)(1) of the Act, 21 U.S.C. section 360bbb-3(b)(1), unless the authorization is terminated or revoked.     Resp Syncytial Virus by PCR NEGATIVE NEGATIVE Final    Comment: (NOTE) Fact Sheet for Patients: BloggerCourse.com  Fact Sheet for Healthcare Providers: SeriousBroker.it  This test is not yet approved or cleared by the [place] FDA and has been authorized for detection and/or diagnosis of SARS-CoV-2 by FDA under an Emergency Use Authorization (EUA). This EUA will remain in effect (meaning this test can be used) for the duration of the COVID-19 declaration under Section 564(b)(1) of the Act, 21 U.S.C. section 360bbb-3(b)(1), unless the authorization is terminated or revoked.  Performed at [facility], [address], [place], [place] [address]       Radiology Studies: CT HEAD WO CONTRAST ( )  Result Date: 01/27/2023 CLINICAL DATA:  Headache, tension-type headache EXAM: CT HEAD WITHOUT CONTRAST TECHNIQUE: Contiguous axial images were obtained from the base of the skull through the vertex without intravenous contrast. RADIATION DOSE REDUCTION: This exam was performed according to the departmental dose-optimization program which  includes automated exposure control, adjustment of the mA and/or kV according to patient size and/or use of iterative reconstruction technique. COMPARISON:  10/21/2020 FINDINGS: Brain: Mild age related volume loss. No acute intracranial abnormality. Specifically, no hemorrhage, hydrocephalus, mass lesion, acute infarction, or significant intracranial injury. Vascular: No hyperdense vessel or unexpected calcification. Skull: No acute calvarial abnormality. Sinuses/Orbits: No acute findings Other: None IMPRESSION: Normal study. Electronically Signed   By: Charlett Nose M.D.   On: 01/27/2023 01:09   US Abdomen Limited RUQ (LIVER/GB)  Result Date: 01/27/2023 CLINICAL DATA:  Biliary disease EXAM: ULTRASOUND ABDOMEN LIMITED RIGHT UPPER QUADRANT COMPARISON:  CT 01/26/2023 FINDINGS: Gallbladder: Layering gallstones within the gallbladder measuring up to 1.3 cm. No wall thickening or sonographic Murphy sign. Common bile duct: Diameter: Dilated, measuring up to 16 mm. The distal duct is difficult to visualize due to overlying bowel gas. Liver: 1.2 cm right hepatic cyst. Normal echotexture. No suspicious hepatic abnormality. Portal vein is patent on color Doppler imaging with normal direction of blood flow towards the liver. Other: None. IMPRESSION: Cholelithiasis.  No sonographic evidence of acute cholecystitis. Dilated common bile duct. The distal duct cannot be visualized due to overlying bowel gas. Electronically Signed   By: Charlett Nose M.D.   On: 01/27/2023 01:08   CT CHEST ABDOMEN PELVIS W CONTRAST  Result Date: 01/26/2023 CLINICAL DATA:  Sepsis.  Altered mental status. EXAM: CT CHEST, ABDOMEN, AND PELVIS WITH CONTRAST TECHNIQUE: Multidetector CT imaging of the chest, abdomen and pelvis was performed following the standard protocol during bolus administration of intravenous contrast. RADIATION DOSE REDUCTION: This exam was performed according to the departmental dose-optimization program which includes automated  exposure control, adjustment of the mA and/or kV according to patient size and/or use of iterative reconstruction technique. CONTRAST:  OMNIPAQUE IOHEXOL 300 MG/ML  SOLN COMPARISON:  CT abdomen pelvis dated 11/28/2021. FINDINGS: CT CHEST FINDINGS Cardiovascular: There is no cardiomegaly or pericardial effusion. There is moderate atherosclerotic calcification of the thoracic aorta. No aneurysmal dilatation or dissection. The aorta is tortuous. The central pulmonary arteries appear patent. Mediastinum/Nodes: No hilar or mediastinal adenopathy. The esophagus is grossly unremarkable. No mediastinal fluid collection. Lungs/Pleura: Bibasilar subpleural atelectasis/scarring. Small left upper lobe calcified granuloma. No consolidative changes. There is no pleural effusion or pneumothorax. The central airways  are patent. Musculoskeletal: Osteopenia with degenerative changes of the spine. No acute osseous pathology. CT ABDOMEN PELVIS FINDINGS No intra-abdominal free air or free fluid. Hepatobiliary: Multiple small liver cysts and additional subcentimeter hypodense lesions which are too small to characterize. There is mild biliary ductal dilatation. There is tumefactive sludge and stones within the gallbladder. Probable noncalcified stone in the CBD. Further evaluation with ultrasound or MRCP is recommended. No pericholecystic fluid or evidence of acute cholecystitis by CT. Pancreas: The pancreas is unremarkable. Spleen: Normal in size without focal abnormality. Adrenals/Urinary Tract: The adrenal glands are unremarkable. There is no hydronephrosis on either side. There is symmetric enhancement and excretion of contrast by both kidneys. The urinary bladder is mildly distended despite presence of a Foley catheter. Air within the bladder introduced via the catheter. There is trabeculated appearance of the bladder wall which may be related to chronic bladder dysfunction. Stomach/Bowel: Percutaneous gastrostomy with balloon  in the body of the stomach. There is large amount of dense stool throughout the colon there is no bowel obstruction or active inflammation. The appendix is normal. Vascular/Lymphatic: Moderate aortoiliac atherosclerotic disease. An infrarenal IVC filter is present. No portal venous gas. There is no adenopathy. Reproductive: The uterus is retroverted.  No adnexal masses. Other: Mild subcutaneous stranding of the pelvis. Musculoskeletal: Osteopenia with scoliosis and degenerative changes. Evidence of prior posterior lumbar fusion. No acute osseous pathology. IMPRESSION: 1. No acute intrathoracic pathology. 2. Cholelithiasis and choledocholithiasis with mild dilatation of the biliary tree. Further evaluation with ultrasound or MRCP is recommended. 3. Constipation. No bowel obstruction. Normal appendix. 4.  Aortic Atherosclerosis (ICD10-I70.0). Electronically Signed   By: Elgie Collard M.D.   On: 01/26/2023 22:03   DG Chest Port 1 View  Result Date: 01/26/2023 CLINICAL DATA:  Questionable sepsis EXAM: PORTABLE CHEST 1 VIEW COMPARISON:  Chest x-ray 10/21/2020 FINDINGS: Loop recorder device overlies the left chest. The heart size and mediastinal contours are within normal limits. Both lungs are clear. The visualized skeletal structures are unremarkable. IMPRESSION: No active disease. Electronically Signed   By: Darliss Cheney M.D.   On: 01/26/2023 20:58      Scheduled Meds:  apixaban  5 mg Oral BID   atorvastatin  20 mg Oral QHS   busPIRone  15 mg Oral BID   Chlorhexidine Gluconate Cloth  6 each Topical Daily   hydrocerin   Topical BID   linaclotide  145 mcg Oral Daily   lisinopril  10 mg Oral Daily   magnesium citrate  1 Bottle Oral Once   memantine  28 mg Oral Daily   Continuous Infusions:  cefTRIAXone (ROCEPHIN)  IV     lactated ringers 75 mL/hr at 01/27/23 0601     LOS: 1 day   Time spent: 35 minutes   Noralee Stain, DO [facility] 01/27/2023, 12:32 PM   Available via Epic  secure chat 7am-7pm After these hours, please refer to coverage provider listed on [email or web address]

## 2023-01-27 NOTE — Progress Notes (Signed)
Patient ID: [name], female   DOB: [birth year], 82 y.o.   MRN: [id number] Asked to evaluate patient's G-tube secondary to appearance and possible leaking.  Patient currently has a 20 [place] pull-through gastrostomy tube in place. The tube itself is darkened in color but is functioning appropriately, flushes ok and in good position by latest CT. The insertion site is without sig erythema or bleeding. The disc was made taut to skin to decrease chances of leaking and tape applied above disc to prevent migration up tube. Since pull thru tubes are more durable and exchange would require conversion to less durable balloon retention tube would maximize use of current tube for as long as possible. Nurse updated.

## 2023-01-27 NOTE — Progress Notes (Signed)
PT Cancellation Note  Patient Details Name: [name] MRN: [id number] DOB: [birth year]   Cancelled Treatment:    Reason Eval/Treat Not Completed: PT screened, no needs identified, will sign off. Pt is bedbound and totally dependent at baseline. Will sign off.    Faye Ramsay, PT Acute Rehabilitation  Office: [phone]

## 2023-01-27 NOTE — Consult Note (Signed)
WOC Nurse Consult Note: Reason for Consult: Wound type: Pressure Injury POA: Yes/No/NA Measurement: Wound bed: Drainage (amount, consistency, odor)  Periwound: Dressing procedure/placement/frequency: Foam dressing to the healed area on the sacrum, change every 3 days and PRN soilage Eucerin to the affected areas of the hands BID Silver hydrofiber to the spinal wound, change every other day.    Re consult if needed, will not follow at this time. Thanks  Islam Villescas [employer], RN,CWOCN, CNS, CWON-AP [phone])

## 2023-01-27 NOTE — Progress Notes (Signed)
Occupational Therapy Treatment Patient Details Name: [name] MRN: [id number] DOB: 1940/12/31 Today's Date: 01/27/2023   History of present illness Patient is a 82 year old female who presented with AMS. 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 PMH: lumbar hardwear removal 2021, loop recorder, spinal surgery, L TKA 2017, stroke, takotsubo cardiomyopathy, scoliosis,   OT comments  Patient was able to tolerate palm guard for 4 hours on this date. Patient reported no irritation or discomfort with wearing with multiple checks during wear time. Nurse was educated on recommendations for wear time of 4 hours on/off. Patients board in room was updated on the same. No further skilled OT need identified at this time. OT to sign off.    Recommendations for follow up therapy are one component of a multi-disciplinary discharge planning process, led by the attending physician.  Recommendations may be updated based on patient status, additional functional criteria and insurance authorization.    Assistance Recommended at Discharge Frequent or constant Supervision/Assistance  Patient can return home with the following  Two people to help with walking and/or transfers;Two people to help with bathing/dressing/bathroom;Assistance with feeding;Direct supervision/assist for financial management;Help with stairs or ramp for entrance;Assist for transportation;Direct supervision/assist for medications management;Assistance with cooking/housework   Equipment Recommendations  None recommended by OT       Precautions / Restrictions Precautions Precaution Comments: bedbound at home Restrictions Weight Bearing Restrictions: No       Mobility Bed Mobility               General bed mobility comments: patient is TD for rolling in bed.             ADL either performed or assessed with clinical judgement   ADL Overall ADL's : Needs  assistance/impaired         General ADL Comments: patient was checked on multiple times on this date with patient able to tolerate resting hand splint for up to four hours on this date. orders placed in chart per MD request this AM. nurse was educated on plam guard use and duration. nurse verbalized understanding. patient hand no additional redness or skin irritation than was present prior to donning splint this AM. patient reported no discomfort with use of palm guard during any checks on this date. son was educated on it this AM as noted in previous note. no more skilled OT needs identified at this time.      Cognition Arousal/Alertness: Awake/alert Behavior During Therapy: Flat affect Overall Cognitive Status: History of cognitive impairments - at baseline     General Comments: very plesant                   Pertinent Vitals/ Pain       Pain Assessment Pain Assessment: No/denies pain Faces Pain Scale: No hurt Pain Location: bottom with rolling and sitting upright in bed. Pain Descriptors / Indicators: Discomfort, Grimacing Pain Intervention(s): Monitored during session, Limited activity within patient's tolerance, Other (comment) (RN in room providing medications during session. MD in room as well)            Progress Toward Goals  OT Goals(current goals can now be found in the care plan section)  Progress towards OT goals: Goals met/education completed, patient discharged from OT     Plan Discharge plan remains appropriate       AM-PAC OT "6 Clicks" Daily Activity     Outcome Measure   Help  from another person eating meals?: A Lot Help from another person taking care of personal grooming?: Total Help from another person toileting, which includes using toliet, bedpan, or urinal?: Total Help from another person bathing (including washing, rinsing, drying)?: Total Help from another person to put on and taking off regular upper body clothing?: Total Help from  another person to put on and taking off regular lower body clothing?: Total 6 Click Score: 7    End of Session Equipment Utilized During Treatment: Other (comment) (palm guard)  OT Visit Diagnosis: Unsteadiness on feet (R26.81);Muscle weakness (generalized) (M62.81)   Activity Tolerance Patient limited by pain   Patient Left in bed;with call bell/phone within reach;with bed alarm set   Nurse Communication Other (comment) (palm guard use)        Time: 1441-1450 (patient was checked on multiple times on this date totaling over 20 mins total) OT Time Calculation (min): 9 min  Charges: OT General Charges $OT Visit: 1 Visit OT Treatments $Therapeutic Activity: 8-22 mins  Rosalio Loud, MS Acute Rehabilitation Department Office# [phone]   Selinda Flavin 01/27/2023, 3:00 PM

## 2023-01-27 NOTE — Evaluation (Signed)
Clinical/Bedside Swallow Evaluation Patient Details  Name: [name] MRN: [id number] Date of Birth: 10/20/1940  Today's Date: 01/27/2023 Time: SLP Start Time (ACUTE ONLY): 1435 SLP Stop Time (ACUTE ONLY): 1455 SLP Time Calculation (min) (ACUTE ONLY): 20 min  Past Medical History:  Past Medical History:  Diagnosis Date   Arthritis    Asthma    Chronic back pain    Depression    History of pulmonary embolus (PE)    Hx-TIA (transient ischemic attack)    Hyperlipemia    Hypertension    Personal history of DVT (deep vein thrombosis)    Scoliosis    Sleep apnea    cpap - settingsi at 2    Stroke [facility])    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   Past Surgical History:  Past Surgical History:  Procedure Laterality Date   ARTHROSCOPIC REPAIR ACL  02/03/2004   HARDWARE REMOVAL N/A 07/27/2020   Procedure: Lumbar Hardware removal with lumbar wound debridement;  Surgeon: Barnett Abu, MD;  Location: [facility] OR;  Service: Neurosurgery;  Laterality: N/A;   IR GASTROSTOMY TUBE MOD SED  10/30/2020   IR IVC FILTER PLMT / S&I /IMG GUID/MOD SED  05/10/2020   LEFT HEART CATH AND CORONARY ANGIOGRAPHY N/A 09/16/2019   Procedure: LEFT HEART CATH AND CORONARY ANGIOGRAPHY;  Surgeon: Yvonne Kendall, MD;  Location: [facility] INVASIVE CV LAB;  Service: Cardiovascular;  Laterality: N/A;   LOOP RECORDER INSERTION N/A 02/21/2020   Procedure: LOOP RECORDER INSERTION;  Surgeon: Duke Salvia, MD;  Location: [facility] INVASIVE CV LAB;  Service: Cardiovascular;  Laterality: N/A;   LUMBAR WOUND DEBRIDEMENT N/A 07/27/2020   Procedure: LUMBAR WOUND DEBRIDEMENT;  Surgeon: Barnett Abu, MD;  Location: [facility] OR;  Service: Neurosurgery;  Laterality: N/A;   OOPHORECTOMY  1970   SPINE SURGERY  03/31/2006   TOTAL KNEE ARTHROPLASTY Left 05/17/2016   Procedure: LEFT TOTAL KNEE ARTHROPLASTY;  Surgeon: Kathryne Hitch, MD;  Location: [facility] ORS;  Service: Orthopedics;  Laterality: Left;  Adductor Block; Spinal to General   TUBAL LIGATION   1983   HPI:  Patient is an 82 y.o. female with PMH: CVA, depression, chronic wounds, HTN, asthma, sleep apnea, dysphagia. She is total assist care and son and daughter in law provide this. She presented to the hospital on 01/26/23 with AMS. In ED, she was not able to specify where she was; she was hypertensive; CT chest/abd/pelvis showed cholelithiasis and choledocholithiasis; she is constipated; c/o headache. CXR reported that both lungs were clear.    Assessment / Plan / Recommendation  Clinical Impression  Based on today's bedside swallow evaluation, it does not appear that patient has had an acute change in her swallow function beyond baseline difficulties. SLP last saw patient for swallow evaluation and treatment back in 2022 and at time of that discharge, she was on Dys 3 solids, thin liquids. Currently patient is on Dys 3 solids, thin liquids and per NT, she has been eating and drinking well, no coughing or difficulties swallowing observed. Patient was very pleasant, awake, and alert and telling SLP that she wants to go back home. She was receptive to having thin liquids to drink (ginger ale) and solids (graham cracker). She exhibited mildly prolonged mastication with graham cracker, but no overt s/s aspiration during or after PO's of thin liquids and solids. SLP spoke again with NT to determine if diet should stay on Dys 3 solids or advance to regular solids but NT reported that Dys 3 would be better as she has  to cut up her food anyway. SLP not recommending further skilled intervention at this time. SLP Visit Diagnosis: Dysphagia, unspecified (R13.10)    Aspiration Risk  Mild aspiration risk    Diet Recommendation Dysphagia 3 (Mech soft);Thin liquid   Liquid Administration via: Cup;Straw Medication Administration: Other (Comment) (as tolerated) Supervision: Patient able to self feed;Full supervision/cueing for compensatory strategies;Staff to assist with self feeding Compensations: Slow  rate;Small sips/bites Postural Changes: Seated upright at 90 degrees    Other  Recommendations Oral Care Recommendations: Oral care BID    Recommendations for follow up therapy are one component of a multi-disciplinary discharge planning process, led by the attending physician.  Recommendations may be updated based on patient status, additional functional criteria and insurance authorization.  Follow up Recommendations No SLP follow up      Assistance Recommended at Discharge    Functional Status Assessment Patient has not had a recent decline in their functional status  Frequency and Duration            Prognosis        Swallow Study   General Date of Onset: 01/26/23 HPI: Patient is an 82 y.o. female with PMH: CVA, depression, chronic wounds, HTN, asthma, sleep apnea, dysphagia. She is total assist care and son and daughter in law provide this. She presented to the hospital on 01/26/23 with AMS. In ED, she was not able to specify where she was; she was hypertensive; CT chest/abd/pelvis showed cholelithiasis and choledocholithiasis; she is constipated; c/o headache. CXR reported that both lungs were clear. Type of Study: Bedside Swallow Evaluation Previous Swallow Assessment: remote, during 2021, 2022 admits Diet Prior to this Study: Dysphagia 3 (mechanical soft);Thin liquids (Level 0) Temperature Spikes Noted: No Respiratory Status: Room air History of Recent Intubation: No Behavior/Cognition: Cooperative;Alert;Pleasant mood Oral Cavity Assessment: Within Functional Limits Oral Care Completed by SLP: No Vision: Functional for self-feeding Self-Feeding Abilities: Able to feed self Patient Positioning: Upright in bed Baseline Vocal Quality: Normal Volitional Cough: Strong Volitional Swallow: Able to elicit    Oral/Motor/Sensory Function Overall Oral Motor/Sensory Function: Mild impairment Facial ROM: Reduced left Facial Symmetry: Abnormal symmetry left Facial Strength:  Reduced left Lingual ROM: Reduced left   Ice Chips     Thin Liquid Thin Liquid: Within functional limits Presentation: Self Fed;Straw    Nectar Thick     Honey Thick     Puree Puree: Not tested   Solid     Solid: Within functional limits Presentation: Self Fed     Angela Nevin, MA, CCC-SLP Speech Therapy

## 2023-01-28 ENCOUNTER — Other Ambulatory Visit: Payer: Self-pay

## 2023-01-28 DIAGNOSIS — E440 Moderate protein-calorie malnutrition: Secondary | ICD-10-CM | POA: Insufficient documentation

## 2023-01-28 DIAGNOSIS — A419 Sepsis, unspecified organism: Secondary | ICD-10-CM | POA: Diagnosis not present

## 2023-01-28 DIAGNOSIS — L0390 Cellulitis, unspecified: Secondary | ICD-10-CM | POA: Diagnosis not present

## 2023-01-28 LAB — GLUCOSE, CAPILLARY
Glucose-Capillary: 110 mg/dL — ABNORMAL HIGH (ref 70–99)
Glucose-Capillary: 124 mg/dL — ABNORMAL HIGH (ref 70–99)
Glucose-Capillary: 139 mg/dL — ABNORMAL HIGH (ref 70–99)
Glucose-Capillary: 149 mg/dL — ABNORMAL HIGH (ref 70–99)
Glucose-Capillary: 92 mg/dL (ref 70–99)
Glucose-Capillary: 98 mg/dL (ref 70–99)
Glucose-Capillary: 99 mg/dL (ref 70–99)

## 2023-01-28 LAB — CBC
HCT: 36.4 % (ref 36.0–46.0)
Hemoglobin: 11.6 g/dL — ABNORMAL LOW (ref 12.0–15.0)
MCH: 33.6 pg (ref 26.0–34.0)
MCHC: 31.9 g/dL (ref 30.0–36.0)
MCV: 105.5 fL — ABNORMAL HIGH (ref 80.0–100.0)
Platelets: 104 10*3/uL — ABNORMAL LOW (ref 150–400)
RBC: 3.45 MIL/uL — ABNORMAL LOW (ref 3.87–5.11)
RDW: 13.6 % (ref 11.5–15.5)
WBC: 10.6 10*3/uL — ABNORMAL HIGH (ref 4.0–10.5)
nRBC: 0 % (ref 0.0–0.2)

## 2023-01-28 LAB — BASIC METABOLIC PANEL
Anion gap: 3 — ABNORMAL LOW (ref 5–15)
BUN: 22 mg/dL (ref 8–23)
CO2: 28 mmol/L (ref 22–32)
Calcium: 8.3 mg/dL — ABNORMAL LOW (ref 8.9–10.3)
Chloride: 110 mmol/L (ref 98–111)
Creatinine, Ser: 0.42 mg/dL — ABNORMAL LOW (ref 0.44–1.00)
GFR, Estimated: >60 mL/min (ref >60)
Glucose, Bld: 156 mg/dL — ABNORMAL HIGH (ref 70–99)
Potassium: 3.3 mmol/L — ABNORMAL LOW (ref 3.5–5.1)
Sodium: 141 mmol/L (ref 135–145)

## 2023-01-28 LAB — CULTURE, BLOOD (ROUTINE X 2)

## 2023-01-28 LAB — MRSA NEXT GEN BY PCR, NASAL: MRSA by PCR Next Gen: NOT DETECTED

## 2023-01-28 LAB — MAGNESIUM: Magnesium: 2 mg/dL (ref 1.7–2.4)

## 2023-01-28 MED ORDER — BISACODYL 10 MG RE SUPP
10.0000 mg | Freq: Every day | RECTAL | Status: DC | PRN
  Administered 2023-01-28: 10 mg via RECTAL
  Filled 2023-01-28: qty 1

## 2023-01-28 MED ORDER — POTASSIUM CHLORIDE CRYS ER 20 MEQ PO TBCR
40.0000 meq | EXTENDED_RELEASE_TABLET | Freq: Once | ORAL | Status: AC
  Administered 2023-01-28: 40 meq via ORAL
  Filled 2023-01-28: qty 2

## 2023-01-28 NOTE — Progress Notes (Signed)
PROGRESS NOTE    [name]  JXB:[id number] DOB: 14-Dec-1940 DOA: 01/26/2023 PCP: [name], MD     Brief Narrative:  [name] is a 82 y.o. female with medical history significant of hypertension, hyperlipidemia, prior stroke, depression who presented to the emergency department due to altered mental status.  Patient reportedly lives at home with family and has wound care that helps take care of her chronic wounds.  Family noted her to be more confused so brought her to the emergency department for further assessment.  On arrival she was notably altered and unable to specify where she was.  She endorsed headache but denied other complaints.  She had noticeable foul-smelling urine on presentation.  On arrival she was febrile to 101.7 and hypertensive with systolics in the 180s.  Labs were obtained which revealed WBC 20.9, platelets 127, INR 1.5.  CT chest abdomen pelvis was obtained which showed cholelithiasis and choledocholithiasis with mild dilation of the biliary tree with recommended ultrasound/MRCP.  She is also noted to be constipated.  Examination revealed erythematous sacral decubitus ulcers.  Patient was admitted for further workup.  She was started on vancomycin and ceftriaxone.  On admission she was unable to contribute to her presentation.  She did not know her name or where she was.  She endorsed headache.   Patient lives at home with son and daughter-in-law, is bedbound and completely dependent on ADLs.  New events last 24 hours / Subjective: Patient without any new complaints this morning.  Did not have an appetite in the morning.  Assessment & Plan:   Principal Problem:   Sepsis due to cellulitis [facility]) Active Problems:   Acute encephalopathy   HTN (hypertension)   Depression   Hyperlipemia   Dementia (HCC)   Benign essential HTN   Debility   Decubitus ulcer   Malnutrition of moderate degree  Sepsis secondary to cellulitis left upper extremity -Sepsis  present on admission with fever 101.7 and leukocytosis 20.9 -UTI is ruled out.  UA is unremarkable -Blood cultures negative to date -Rocephin -WBC improving  Acute metabolic encephalopathy -Secondary to above -Dementia at baseline  Biliary ductal dilatation -Right upper quadrant ultrasound: Cholelithiasis.  No sonographic evidence of acute cholecystitis. Dilated CBD  -No abdominal pain -LFTs within normal limits  Dementia -Bedbound at baseline with contractures -Lives at home with son and daughter-in-law -[place]  Evaluation for G tube -Per son, they use tube feedings nightly.  Dietitian consulted for orders -Son would like G tubing to be evaluated while patient is at the hospital due to the color and ?leaking -IR consulted: Currently G-tube is functioning appropriately, flushes okay and in good position by latest CT.  Insertion site without significant erythema or bleeding.  Recommended to maximize use of current tube for as long as possible as exchange will require conversion to less durable balloon retention 2.  Stage II decubitus ulcers, present on admission -Followed by outpatient wound clinic very closely -Does not seem to be acutely infected -Appreciate wound RN recommendation  Hyperlipidemia -Lipitor  Paroxysmal A-fib, Hx DVT and PE  -Eliquis  Hypertension -Lisinopril  GAD -BuSpar  Hypokalemia -Replace   DVT prophylaxis:  SCDs Start: 01/26/23 2347 apixaban (ELIQUIS) tablet 5 mg  Code Status: DNR Family Communication: Son over the phone  Disposition Plan: Home  Status is: Inpatient Remains inpatient appropriate because: IV antibiotics     Antimicrobials:  Anti-infectives (From admission, onward)    Start     Dose/Rate Route Frequency Ordered Stop  01/27/23 2200  vancomycin (VANCOCIN) IVPB 1000 mg/200 mL premix  Status:  Discontinued        1,000 mg 200 mL/hr over 60 Minutes Intravenous Every 24 hours 01/27/23 0056 01/27/23 1045   01/27/23 2000   cefTRIAXone (ROCEPHIN) 2 g in sodium chloride 0.9 % 100 mL IVPB        2 g 200 mL/hr over 30 Minutes Intravenous Every 24 hours 01/27/23 1045 02/02/23 1959   01/26/23 2130  vancomycin (VANCOCIN) IVPB 1000 mg/200 mL premix        1,000 mg 200 mL/hr over 60 Minutes Intravenous  Once 01/26/23 2129 01/26/23 2329   01/26/23 2000  cefTRIAXone (ROCEPHIN) 2 g in sodium chloride 0.9 % 100 mL IVPB  Status:  Discontinued        2 g 200 mL/hr over 30 Minutes Intravenous Every 24 hours 01/26/23 1959 01/27/23 1045        Objective: Vitals:   01/27/23 2021 01/27/23 2220 01/28/23 0426 01/28/23 0428  BP: 139/64   (!) 166/72  Pulse: (!) 51 (!) 57  (!) 52  Resp: 16   18  Temp: 98.8 F (37.1 C)   98.3 F (36.8 C)  TempSrc: Oral   Oral  SpO2: 96% 96%  96%  Weight:   82.7 kg   Height:        Intake/Output Summary (Last 24 hours) at 01/28/2023 1233 Last data filed at 01/28/2023 0900 Gross per 24 hour  Intake 1499.87 ml  Output 1550 ml  Net -50.13 ml    Filed Weights   01/27/23 0000 01/28/23 0426  Weight: 62 kg 82.7 kg    Examination:  General exam: Appears calm and comfortable  Respiratory system: Clear to auscultation. Respiratory effort normal.  Cardiovascular system: S1 & S2 heard, RRR. No murmurs.  Gastrointestinal system: Abdomen is nondistended, soft , +G tube in place  Central nervous system: Alert    Data Reviewed: I have personally reviewed following labs and imaging studies  CBC: Recent Labs  Lab 01/26/23 2015 01/27/23 0432 01/28/23 0537  WBC 20.9* 16.2* 10.6*  NEUTROABS 18.6*  --   --   HGB 13.5 12.1 11.6*  HCT 42.0 38.5 36.4  MCV 103.7* 104.9* 105.5*  PLT 127* 105* 104*    Basic Metabolic Panel: Recent Labs  Lab 01/26/23 2015 01/27/23 0432 01/28/23 0537  NA 137 137 141  K 3.9 3.6 3.3*  CL 103 106 110  CO2 27 27 28   GLUCOSE 114* 85 156*  BUN 22 18 22   CREATININE 0.59 0.38* 0.42*  CALCIUM 8.7* 8.3* 8.3*  MG  --   --  2.0    GFR: Estimated  Creatinine Clearance: 57.6 mL/min (A) (by C-G formula based on SCr of 0.42 mg/dL (L)). Liver Function Tests: Recent Labs  Lab 01/26/23 2015  AST 24  ALT 21  ALKPHOS 55  BILITOT 1.2  PROT 6.5  ALBUMIN 2.7*    No results for input(s): "LIPASE", "AMYLASE" in the last 168 hours. No results for input(s): "AMMONIA" in the last 168 hours. Coagulation Profile: Recent Labs  Lab 01/26/23 2015  INR 1.5*    Cardiac Enzymes: No results for input(s): "CKTOTAL", "CKMB", "CKMBINDEX", "TROPONINI" in the last 168 hours. BNP (last 3 results) No results for input(s): "PROBNP" in the last 8760 hours. HbA1C: No results for input(s): "HGBA1C" in the last 72 hours. CBG: Recent Labs  Lab 01/27/23 2021 01/28/23 0015 01/28/23 0429 01/28/23 0744 01/28/23 1151  GLUCAP 105* 98 139* 124* 149*  Lipid Profile: No results for input(s): "CHOL", "HDL", "LDLCALC", "TRIG", "CHOLHDL", "LDLDIRECT" in the last 72 hours. Thyroid Function Tests: No results for input(s): "TSH", "T4TOTAL", "FREET4", "T3FREE", "THYROIDAB" in the last 72 hours. Anemia Panel: No results for input(s): "VITAMINB12", "FOLATE", "FERRITIN", "TIBC", "IRON", "RETICCTPCT" in the last 72 hours. Sepsis Labs: Recent Labs  Lab 01/26/23 2015 01/27/23 0432  LATICACIDVEN 1.4 1.0     Recent Results (from the past 240 hour(s))  Blood Culture (routine x 2)     Status: None (Preliminary result)   Collection Time: 01/26/23  8:08 PM   Specimen: BLOOD  Result Value Ref Range Status   Specimen Description   Final    BLOOD BLOOD LEFT FOREARM Performed at [facility], [address] [place], [place] [address]    Special Requests   Final    BOTTLES DRAWN AEROBIC AND ANAEROBIC Blood Culture adequate volume Performed at [facility], [address] [place], [place] [address]    Culture   Final    NO GROWTH 2 DAYS Performed at [facility], [address], [place], [place] [address]    Report  Status PENDING  Incomplete  Blood Culture (routine x 2)     Status: None (Preliminary result)   Collection Time: 01/26/23  8:08 PM   Specimen: BLOOD  Result Value Ref Range Status   Specimen Description   Final    BLOOD BLOOD RIGHT FOREARM Performed at [facility], [address], [place], [place] [address]    Special Requests   Final    BOTTLES DRAWN AEROBIC AND ANAEROBIC Blood Culture adequate volume Performed at [facility], [address] [place], [place] [address]    Culture   Final    NO GROWTH 2 DAYS Performed at [facility], [address], [place], [place] [address]    Report Status PENDING  Incomplete  Resp panel by RT-PCR (RSV, Flu A&B, Covid) Anterior Nasal Swab     Status: None   Collection Time: 01/26/23  9:03 PM   Specimen: Anterior Nasal Swab  Result Value Ref Range Status   SARS Coronavirus 2 by RT PCR NEGATIVE NEGATIVE Final    Comment: (NOTE) SARS-CoV-2 target nucleic acids are NOT DETECTED.  The SARS-CoV-2 RNA is generally detectable in upper respiratory specimens during the acute phase of infection. The lowest concentration of SARS-CoV-2 viral copies this assay can detect is 138 copies/mL. A negative result does not preclude SARS-Cov-2 infection and should not be used as the sole basis for treatment or other patient management decisions. A negative result may occur with  improper specimen collection/handling, submission of specimen other than nasopharyngeal swab, presence of viral mutation(s) within the areas targeted by this assay, and inadequate number of viral copies(<138 copies/mL). A negative result must be combined with clinical observations, patient history, and epidemiological information. The expected result is Negative.  Fact Sheet for Patients:  BloggerCourse.com  Fact Sheet for Healthcare Providers:  SeriousBroker.it  This test is no t yet  approved or cleared by the [place] FDA and  has been authorized for detection and/or diagnosis of SARS-CoV-2 by FDA under an Emergency Use Authorization (EUA). This EUA will remain  in effect (meaning this test can be used) for the duration of the COVID-19 declaration under Section 564(b)(1) of the Act, 21 U.S.C.section 360bbb-3(b)(1), unless the authorization is terminated  or revoked sooner.       Influenza A by PCR NEGATIVE  NEGATIVE Final   Influenza B by PCR NEGATIVE NEGATIVE Final    Comment: (NOTE) The Xpert Xpress SARS-CoV-2/FLU/RSV plus assay is intended as an aid in the diagnosis of influenza from Nasopharyngeal swab specimens and should not be used as a sole basis for treatment. Nasal washings and aspirates are unacceptable for Xpert Xpress SARS-CoV-2/FLU/RSV testing.  Fact Sheet for Patients: BloggerCourse.com  Fact Sheet for Healthcare Providers: SeriousBroker.it  This test is not yet approved or cleared by the [place] FDA and has been authorized for detection and/or diagnosis of SARS-CoV-2 by FDA under an Emergency Use Authorization (EUA). This EUA will remain in effect (meaning this test can be used) for the duration of the COVID-19 declaration under Section 564(b)(1) of the Act, 21 U.S.C. section 360bbb-3(b)(1), unless the authorization is terminated or revoked.     Resp Syncytial Virus by PCR NEGATIVE NEGATIVE Final    Comment: (NOTE) Fact Sheet for Patients: BloggerCourse.com  Fact Sheet for Healthcare Providers: SeriousBroker.it  This test is not yet approved or cleared by the [place] FDA and has been authorized for detection and/or diagnosis of SARS-CoV-2 by FDA under an Emergency Use Authorization (EUA). This EUA will remain in effect (meaning this test can be used) for the duration of the COVID-19 declaration under Section 564(b)(1) of  the Act, 21 U.S.C. section 360bbb-3(b)(1), unless the authorization is terminated or revoked.  Performed at [facility], [address] [place], [place] [address]   MRSA Next Gen by PCR, Nasal     Status: None   Collection Time: 01/28/23  6:31 AM   Specimen: Nasal Mucosa; Nasal Swab  Result Value Ref Range Status   MRSA by PCR Next Gen NOT DETECTED NOT DETECTED Final    Comment: (NOTE) The GeneXpert MRSA Assay (FDA approved for NASAL specimens only), is one component of a comprehensive MRSA colonization surveillance program. It is not intended to diagnose MRSA infection nor to guide or monitor treatment for MRSA infections. Test performance is not FDA approved in patients less than 29 years old. Performed at [facility], [address], [place], [place] [address]       Radiology Studies: CT HEAD WO CONTRAST ( )  Result Date: 01/27/2023 CLINICAL DATA:  Headache, tension-type headache EXAM: CT HEAD WITHOUT CONTRAST TECHNIQUE: Contiguous axial images were obtained from the base of the skull through the vertex without intravenous contrast. RADIATION DOSE REDUCTION: This exam was performed according to the departmental dose-optimization program which includes automated exposure control, adjustment of the mA and/or kV according to patient size and/or use of iterative reconstruction technique. COMPARISON:  10/21/2020 FINDINGS: Brain: Mild age related volume loss. No acute intracranial abnormality. Specifically, no hemorrhage, hydrocephalus, mass lesion, acute infarction, or significant intracranial injury. Vascular: No hyperdense vessel or unexpected calcification. Skull: No acute calvarial abnormality. Sinuses/Orbits: No acute findings Other: None IMPRESSION: Normal study. Electronically Signed   By: Charlett Nose M.D.   On: 01/27/2023 01:09   US Abdomen Limited RUQ (LIVER/GB)  Result Date: 01/27/2023 CLINICAL DATA:  Biliary disease EXAM: ULTRASOUND  ABDOMEN LIMITED RIGHT UPPER QUADRANT COMPARISON:  CT 01/26/2023 FINDINGS: Gallbladder: Layering gallstones within the gallbladder measuring up to 1.3 cm. No wall thickening or sonographic Murphy sign. Common bile duct: Diameter: Dilated, measuring up to 16 mm. The distal duct is difficult to visualize due to overlying bowel gas. Liver: 1.2 cm right hepatic cyst. Normal echotexture. No suspicious hepatic abnormality. Portal vein is patent on color Doppler imaging with normal direction of  blood flow towards the liver. Other: None. IMPRESSION: Cholelithiasis.  No sonographic evidence of acute cholecystitis. Dilated common bile duct. The distal duct cannot be visualized due to overlying bowel gas. Electronically Signed   By: Charlett Nose M.D.   On: 01/27/2023 01:08   CT CHEST ABDOMEN PELVIS W CONTRAST  Result Date: 01/26/2023 CLINICAL DATA:  Sepsis.  Altered mental status. EXAM: CT CHEST, ABDOMEN, AND PELVIS WITH CONTRAST TECHNIQUE: Multidetector CT imaging of the chest, abdomen and pelvis was performed following the standard protocol during bolus administration of intravenous contrast. RADIATION DOSE REDUCTION: This exam was performed according to the departmental dose-optimization program which includes automated exposure control, adjustment of the mA and/or kV according to patient size and/or use of iterative reconstruction technique. CONTRAST:  OMNIPAQUE IOHEXOL 300 MG/ML  SOLN COMPARISON:  CT abdomen pelvis dated 11/28/2021. FINDINGS: CT CHEST FINDINGS Cardiovascular: There is no cardiomegaly or pericardial effusion. There is moderate atherosclerotic calcification of the thoracic aorta. No aneurysmal dilatation or dissection. The aorta is tortuous. The central pulmonary arteries appear patent. Mediastinum/Nodes: No hilar or mediastinal adenopathy. The esophagus is grossly unremarkable. No mediastinal fluid collection. Lungs/Pleura: Bibasilar subpleural atelectasis/scarring. Small left upper lobe calcified  granuloma. No consolidative changes. There is no pleural effusion or pneumothorax. The central airways are patent. Musculoskeletal: Osteopenia with degenerative changes of the spine. No acute osseous pathology. CT ABDOMEN PELVIS FINDINGS No intra-abdominal free air or free fluid. Hepatobiliary: Multiple small liver cysts and additional subcentimeter hypodense lesions which are too small to characterize. There is mild biliary ductal dilatation. There is tumefactive sludge and stones within the gallbladder. Probable noncalcified stone in the CBD. Further evaluation with ultrasound or MRCP is recommended. No pericholecystic fluid or evidence of acute cholecystitis by CT. Pancreas: The pancreas is unremarkable. Spleen: Normal in size without focal abnormality. Adrenals/Urinary Tract: The adrenal glands are unremarkable. There is no hydronephrosis on either side. There is symmetric enhancement and excretion of contrast by both kidneys. The urinary bladder is mildly distended despite presence of a Foley catheter. Air within the bladder introduced via the catheter. There is trabeculated appearance of the bladder wall which may be related to chronic bladder dysfunction. 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 There is large amount of dense stool throughout the colon there is no bowel obstruction or active inflammation. The appendix is normal. Vascular/Lymphatic: Moderate aortoiliac atherosclerotic disease. An infrarenal IVC filter is present. No portal venous gas. There is no adenopathy. Reproductive: The uterus is retroverted.  No adnexal masses. Other: Mild subcutaneous stranding of the pelvis. Musculoskeletal: Osteopenia with scoliosis and degenerative changes. Evidence of prior posterior lumbar fusion. No acute osseous pathology. IMPRESSION: 1. No acute intrathoracic pathology. 2. Cholelithiasis and choledocholithiasis with mild dilatation of the biliary tree. Further evaluation  with ultrasound or MRCP is recommended. 3. Constipation. No bowel obstruction. Normal appendix. 4.  Aortic Atherosclerosis (ICD10-I70.0). Electronically Signed   By: Elgie Collard M.D.   On: 01/26/2023 22:03   DG Chest Port 1 View  Result Date: 01/26/2023 CLINICAL DATA:  Questionable sepsis EXAM: PORTABLE CHEST 1 VIEW COMPARISON:  Chest x-ray 10/21/2020 FINDINGS: Loop recorder device overlies the left chest. The heart size and mediastinal contours are within normal limits. Both lungs are clear. The visualized skeletal structures are unremarkable. IMPRESSION: No active disease. Electronically Signed   By: Darliss Cheney M.D.   On: 01/26/2023 20:58      Scheduled Meds:  apixaban  5 mg Oral BID   atorvastatin  20 mg Oral QHS   busPIRone  15 mg Oral BID   Chlorhexidine Gluconate Cloth  6 each Topical Daily   feeding supplement (OSMOLITE 1.5 CAL)  1,000 mL Per Tube Q24H   feeding supplement (PROSource TF20)  60 mL Per Tube TID   hydrocerin   Topical BID   linaclotide  145 mcg Oral Daily   lisinopril  10 mg Oral Daily   magnesium citrate  1 Bottle Oral Once   memantine  28 mg Oral Daily   multivitamin with minerals  1 tablet Oral Daily   nutrition supplement (JUVEN)  1 packet Per Tube q morning   Continuous Infusions:  cefTRIAXone (ROCEPHIN)  IV 2 g (01/27/23 2155)     LOS: 2 days   Time spent: 25 minutes   Noralee Stain, DO [facility] 01/28/2023, 12:33 PM   Available via Epic secure chat 7am-7pm After these hours, please refer to coverage provider listed on [email or web address]

## 2023-01-29 ENCOUNTER — Other Ambulatory Visit: Payer: Self-pay | Admitting: Radiology

## 2023-01-29 ENCOUNTER — Ambulatory Visit (HOSPITAL_BASED_OUTPATIENT_CLINIC_OR_DEPARTMENT_OTHER): Payer: Medicare Other | Admitting: General Surgery

## 2023-01-29 ENCOUNTER — Inpatient Hospital Stay (HOSPITAL_COMMUNITY): Payer: Medicare Other

## 2023-01-29 DIAGNOSIS — L0390 Cellulitis, unspecified: Secondary | ICD-10-CM | POA: Diagnosis not present

## 2023-01-29 DIAGNOSIS — Z931 Gastrostomy status: Secondary | ICD-10-CM

## 2023-01-29 DIAGNOSIS — A419 Sepsis, unspecified organism: Secondary | ICD-10-CM | POA: Diagnosis not present

## 2023-01-29 HISTORY — PX: IR REPLACE G-TUBE SIMPLE WO FLUORO: IMG2323

## 2023-01-29 LAB — COMPREHENSIVE METABOLIC PANEL
ALT: 20 U/L (ref 0–44)
AST: 22 U/L (ref 15–41)
Albumin: 2.5 g/dL — ABNORMAL LOW (ref 3.5–5.0)
Alkaline Phosphatase: 50 U/L (ref 38–126)
Anion gap: 6 (ref 5–15)
BUN: 21 mg/dL (ref 8–23)
CO2: 25 mmol/L (ref 22–32)
Calcium: 8.2 mg/dL — ABNORMAL LOW (ref 8.9–10.3)
Chloride: 109 mmol/L (ref 98–111)
Creatinine, Ser: 0.4 mg/dL — ABNORMAL LOW (ref 0.44–1.00)
GFR, Estimated: >60 mL/min (ref >60)
Glucose, Bld: 133 mg/dL — ABNORMAL HIGH (ref 70–99)
Potassium: 3.6 mmol/L (ref 3.5–5.1)
Sodium: 140 mmol/L (ref 135–145)
Total Bilirubin: 0.5 mg/dL (ref 0.3–1.2)
Total Protein: 6 g/dL — ABNORMAL LOW (ref 6.5–8.1)

## 2023-01-29 LAB — CBC
HCT: 44.2 % (ref 36.0–46.0)
Hemoglobin: 13.4 g/dL (ref 12.0–15.0)
MCH: 33.1 pg (ref 26.0–34.0)
MCHC: 30.3 g/dL (ref 30.0–36.0)
MCV: 109.1 fL — ABNORMAL HIGH (ref 80.0–100.0)
Platelets: 104 10*3/uL — ABNORMAL LOW (ref 150–400)
RBC: 4.05 MIL/uL (ref 3.87–5.11)
RDW: 13.3 % (ref 11.5–15.5)
WBC: 8 10*3/uL (ref 4.0–10.5)
nRBC: 0 % (ref 0.0–0.2)

## 2023-01-29 LAB — GLUCOSE, CAPILLARY
Glucose-Capillary: 107 mg/dL — ABNORMAL HIGH (ref 70–99)
Glucose-Capillary: 112 mg/dL — ABNORMAL HIGH (ref 70–99)
Glucose-Capillary: 128 mg/dL — ABNORMAL HIGH (ref 70–99)
Glucose-Capillary: 134 mg/dL — ABNORMAL HIGH (ref 70–99)
Glucose-Capillary: 79 mg/dL (ref 70–99)
Glucose-Capillary: 94 mg/dL (ref 70–99)

## 2023-01-29 LAB — MAGNESIUM: Magnesium: 2.2 mg/dL (ref 1.7–2.4)

## 2023-01-29 LAB — CULTURE, BLOOD (ROUTINE X 2): Special Requests: ADEQUATE

## 2023-01-29 MED ORDER — LIDOCAINE VISCOUS HCL 2 % MT SOLN
15.0000 mL | Freq: Once | OROMUCOSAL | Status: AC
  Administered 2023-01-29: 3 mL via OROMUCOSAL

## 2023-01-29 MED ORDER — LIDOCAINE VISCOUS HCL 2 % MT SOLN
OROMUCOSAL | Status: AC
  Filled 2023-01-29: qty 15

## 2023-01-29 NOTE — Progress Notes (Signed)
PROGRESS NOTE    [name]  YQM:[id number] DOB: 10/14/1940 DOA: 01/26/2023 PCP: [name], MD     Brief Narrative:  [name] is a 82 y.o. female with medical history significant of hypertension, hyperlipidemia, prior stroke, depression who presented to the emergency department due to altered mental status.  In the ED, she was febrile to 101.7. Labs revealed WBC 20.9, platelets 127, INR 1.5.  CT chest abdomen pelvis was obtained which showed cholelithiasis and choledocholithiasis with mild dilation of the biliary tree. Examination revealed erythematous sacral decubitus ulcers.  Patient was admitted for further workup.  She was started on vancomycin and ceftriaxone. Patient lives at home with son and daughter-in-law, is bedbound and completely dependent on ADLs.    New events last 24 hours / Subjective: Patient denies any new complaints    Assessment & Plan:   Principal Problem:   Sepsis due to cellulitis [facility]) Active Problems:   Acute encephalopathy   HTN (hypertension)   Depression   Hyperlipemia   Dementia (HCC)   Benign essential HTN   Debility   Decubitus ulcer   Malnutrition of moderate degree   Sepsis likely secondary to cellulitis left upper extremity Sepsis present on admission with fever 101.7 and leukocytosis 20.9, now resolved UTI is ruled out.  UA is unremarkable Blood cultures negative to date Continue Rocephin  Acute metabolic encephalopathy Improved Secondary to above Dementia at baseline  Biliary ductal dilatation Right upper quadrant ultrasound: Cholelithiasis.  No sonographic evidence of acute cholecystitis.. Noted Dilated CBD Denies abdominal pain, LFTs within normal limits Monitor closely  Dementia Bedbound at baseline with contractures [place]  Evaluation for G tube Per son, they use tube feedings nightly.  Dietitian consulted for orders Son would like G tubing to be evaluated while patient is at the hospital due to  ?leaking IR consulted: Currently G-tube is functioning appropriately, flushes okay and in good position by latest CT.  Insertion site without significant erythema or bleeding.  Recommended to maximize use of current tube for as long as possible as exchange will require conversion to less durable balloon retention tube  Stage II decubitus ulcers, present on admission Followed by outpatient wound clinic very closely Does not seem to be acutely infected Appreciate wound RN recommendation  Hyperlipidemia Lipitor  Paroxysmal A-fib, Hx DVT and PE  Eliquis  Hypertension Lisinopril  GAD BuSpar     DVT prophylaxis:  SCDs Start: 01/26/23 2347 apixaban (ELIQUIS) tablet 5 mg  Code Status: DNR Family Communication: Son over the phone  Disposition Plan: Home  Status is: Inpatient Remains inpatient appropriate because: IV antibiotics     Antimicrobials:  Anti-infectives (From admission, onward)    Start     Dose/Rate Route Frequency Ordered Stop   01/27/23 2200  vancomycin (VANCOCIN) IVPB 1000 mg/200 mL premix  Status:  Discontinued        1,000 mg 200 mL/hr over 60 Minutes Intravenous Every 24 hours 01/27/23 0056 01/27/23 1045   01/27/23 2000  cefTRIAXone (ROCEPHIN) 2 g in sodium chloride 0.9 % 100 mL IVPB        2 g 200 mL/hr over 30 Minutes Intravenous Every 24 hours 01/27/23 1045 02/02/23 1959   01/26/23 2130  vancomycin (VANCOCIN) IVPB 1000 mg/200 mL premix        1,000 mg 200 mL/hr over 60 Minutes Intravenous  Once 01/26/23 2129 01/26/23 2329   01/26/23 2000  cefTRIAXone (ROCEPHIN) 2 g in sodium chloride 0.9 % 100 mL IVPB  Status:  Discontinued        2 g 200 mL/hr over 30 Minutes Intravenous Every 24 hours 01/26/23 1959 01/27/23 1045        Objective: Vitals:   01/28/23 1357 01/28/23 2015 01/29/23 0501 01/29/23 1321  BP: (!) 159/65 (!) 198/78 (!) 170/75 (!) 173/89  Pulse: (!) 48 (!) 57 (!) 56 (!) 54  Resp: 16 18 15 17   Temp: 98.4 F (36.9 C) 98 F (36.7 C) 98 F  (36.7 C) 98.3 F (36.8 C)  TempSrc: Oral Oral Oral Oral  SpO2: 99% 97% 99% 96%  Weight:      Height:        Intake/Output Summary (Last 24 hours) at 01/29/2023 1502 Last data filed at 01/29/2023 1212 Gross per 24 hour  Intake 988 ml  Output 1850 ml  Net -862 ml   Filed Weights   01/27/23 0000 01/28/23 0426  Weight: 62 kg 82.7 kg    Examination:  General: NAD  Cardiovascular: S1, S2 present Respiratory: CTAB Abdomen: Soft, nontender, nondistended, bowel sounds present, G-tube in place Musculoskeletal: No bilateral pedal edema noted Skin: Decubitus ulcer noted Psychiatry: Normal mood    Data Reviewed: I have personally reviewed following labs and imaging studies  CBC: Recent Labs  Lab 01/26/23 2015 01/27/23 0432 01/28/23 0537 01/29/23 0609  WBC 20.9* 16.2* 10.6* 8.0  NEUTROABS 18.6*  --   --   --   HGB 13.5 12.1 11.6* 13.4  HCT 42.0 38.5 36.4 44.2  MCV 103.7* 104.9* 105.5* 109.1*  PLT 127* 105* 104* 104*   Basic Metabolic Panel: Recent Labs  Lab 01/26/23 2015 01/27/23 0432 01/28/23 0537 01/29/23 0609  NA 137 137 141 140  K 3.9 3.6 3.3* 3.6  CL 103 106 110 109  CO2 27 27 28 25   GLUCOSE 114* 85 156* 133*  BUN 22 18 22 21   CREATININE 0.59 0.38* 0.42* 0.40*  CALCIUM 8.7* 8.3* 8.3* 8.2*  MG  --   --  2.0 2.2   GFR: Estimated Creatinine Clearance: 57.6 mL/min (A) (by C-G formula based on SCr of 0.4 mg/dL (L)). Liver Function Tests: Recent Labs  Lab 01/26/23 2015 01/29/23 0609  AST 24 22  ALT 21 20  ALKPHOS 55 50  BILITOT 1.2 0.5  PROT 6.5 6.0*  ALBUMIN 2.7* 2.5*   No results for input(s): "LIPASE", "AMYLASE" in the last 168 hours. No results for input(s): "AMMONIA" in the last 168 hours. Coagulation Profile: Recent Labs  Lab 01/26/23 2015  INR 1.5*   Cardiac Enzymes: No results for input(s): "CKTOTAL", "CKMB", "CKMBINDEX", "TROPONINI" in the last 168 hours. BNP (last 3 results) No results for input(s): "PROBNP" in the last 8760  hours. HbA1C: No results for input(s): "HGBA1C" in the last 72 hours. CBG: Recent Labs  Lab 01/28/23 2007 01/28/23 2333 01/29/23 0354 01/29/23 0734 01/29/23 1158  GLUCAP 99 110* 134* 128* 107*   Lipid Profile: No results for input(s): "CHOL", "HDL", "LDLCALC", "TRIG", "CHOLHDL", "LDLDIRECT" in the last 72 hours. Thyroid Function Tests: No results for input(s): "TSH", "T4TOTAL", "FREET4", "T3FREE", "THYROIDAB" in the last 72 hours. Anemia Panel: No results for input(s): "VITAMINB12", "FOLATE", "FERRITIN", "TIBC", "IRON", "RETICCTPCT" in the last 72 hours. Sepsis Labs: Recent Labs  Lab 01/26/23 2015 01/27/23 0432  LATICACIDVEN 1.4 1.0    Recent Results (from the past 240 hour(s))  Blood Culture (routine x 2)     Status: None (Preliminary result)   Collection Time: 01/26/23  8:08 PM   Specimen: BLOOD  Result  Value Ref Range Status   Specimen Description   Final    BLOOD BLOOD LEFT FOREARM Performed at [facility], [address] [place], [place] [address]    Special Requests   Final    BOTTLES DRAWN AEROBIC AND ANAEROBIC Blood Culture adequate volume Performed at [facility], [address], [place], [place] [address]    Culture   Final    NO GROWTH 3 DAYS Performed at [facility], [address] [place], [place] [address]    Report Status PENDING  Incomplete  Blood Culture (routine x 2)     Status: None (Preliminary result)   Collection Time: 01/26/23  8:08 PM   Specimen: BLOOD  Result Value Ref Range Status   Specimen Description   Final    BLOOD BLOOD RIGHT FOREARM Performed at [facility], [address] [place], [place] [address]    Special Requests   Final    BOTTLES DRAWN AEROBIC AND ANAEROBIC Blood Culture adequate volume Performed at [facility], [address] [place], [place] [address]    Culture   Final    NO GROWTH 3 DAYS Performed at [facility],  [address] [place], [place] [address]    Report Status PENDING  Incomplete  Resp panel by RT-PCR (RSV, Flu A&B, Covid) Anterior Nasal Swab     Status: None   Collection Time: 01/26/23  9:03 PM   Specimen: Anterior Nasal Swab  Result Value Ref Range Status   SARS Coronavirus 2 by RT PCR NEGATIVE NEGATIVE Final    Comment: (NOTE) SARS-CoV-2 target nucleic acids are NOT DETECTED.  The SARS-CoV-2 RNA is generally detectable in upper respiratory specimens during the acute phase of infection. The lowest concentration of SARS-CoV-2 viral copies this assay can detect is 138 copies/mL. A negative result does not preclude SARS-Cov-2 infection and should not be used as the sole basis for treatment or other patient management decisions. A negative result may occur with  improper specimen collection/handling, submission of specimen other than nasopharyngeal swab, presence of viral mutation(s) within the areas targeted by this assay, and inadequate number of viral copies(<138 copies/mL). A negative result must be combined with clinical observations, patient history, and epidemiological information. The expected result is Negative.  Fact Sheet for Patients:  BloggerCourse.com  Fact Sheet for Healthcare Providers:  SeriousBroker.it  This test is no t yet approved or cleared by the [place] FDA and  has been authorized for detection and/or diagnosis of SARS-CoV-2 by FDA under an Emergency Use Authorization (EUA). This EUA will remain  in effect (meaning this test can be used) for the duration of the COVID-19 declaration under Section 564(b)(1) of the Act, 21 U.S.C.section 360bbb-3(b)(1), unless the authorization is terminated  or revoked sooner.       Influenza A by PCR NEGATIVE NEGATIVE Final   Influenza B by PCR NEGATIVE NEGATIVE Final    Comment: (NOTE) The Xpert Xpress SARS-CoV-2/FLU/RSV plus assay is intended as an aid in the  diagnosis of influenza from Nasopharyngeal swab specimens and should not be used as a sole basis for treatment. Nasal washings and aspirates are unacceptable for Xpert Xpress SARS-CoV-2/FLU/RSV testing.  Fact Sheet for Patients: BloggerCourse.com  Fact Sheet for Healthcare Providers: SeriousBroker.it  This test is not yet approved or cleared by the [place] FDA and has been authorized for detection and/or diagnosis of SARS-CoV-2 by FDA under an Emergency Use Authorization (EUA). This  EUA will remain in effect (meaning this test can be used) for the duration of the COVID-19 declaration under Section 564(b)(1) of the Act, 21 U.S.C. section 360bbb-3(b)(1), unless the authorization is terminated or revoked.     Resp Syncytial Virus by PCR NEGATIVE NEGATIVE Final    Comment: (NOTE) Fact Sheet for Patients: BloggerCourse.com  Fact Sheet for Healthcare Providers: SeriousBroker.it  This test is not yet approved or cleared by the [place] FDA and has been authorized for detection and/or diagnosis of SARS-CoV-2 by FDA under an Emergency Use Authorization (EUA). This EUA will remain in effect (meaning this test can be used) for the duration of the COVID-19 declaration under Section 564(b)(1) of the Act, 21 U.S.C. section 360bbb-3(b)(1), unless the authorization is terminated or revoked.  Performed at [facility], [address], [place], [place] [address]   MRSA Next Gen by PCR, Nasal     Status: None   Collection Time: 01/28/23  6:31 AM   Specimen: Nasal Mucosa; Nasal Swab  Result Value Ref Range Status   MRSA by PCR Next Gen NOT DETECTED NOT DETECTED Final    Comment: (NOTE) The GeneXpert MRSA Assay (FDA approved for NASAL specimens only), is one component of a comprehensive MRSA colonization surveillance program. It is not intended to diagnose MRSA  infection nor to guide or monitor treatment for MRSA infections. Test performance is not FDA approved in patients less than 85 years old. Performed at [facility], [address] [place], [place] [address]       Radiology Studies: No results found.    Scheduled Meds:  apixaban  5 mg Oral BID   atorvastatin  20 mg Oral QHS   busPIRone  15 mg Oral BID   Chlorhexidine Gluconate Cloth  6 each Topical Daily   feeding supplement (OSMOLITE 1.5 CAL)  1,000 mL Per Tube Q24H   feeding supplement (PROSource TF20)  60 mL Per Tube TID   hydrocerin   Topical BID   linaclotide  145 mcg Oral Daily   lisinopril  10 mg Oral Daily   magnesium citrate  1 Bottle Oral Once   memantine  28 mg Oral Daily   multivitamin with minerals  1 tablet Oral Daily   nutrition supplement (JUVEN)  1 packet Per Tube q morning   Continuous Infusions:  cefTRIAXone (ROCEPHIN)  IV 2 g (01/28/23 2054)     LOS: 3 days   Time spent: 25 minutes   Briant Cedar, MD [facility] 01/29/2023, 3:02 PM

## 2023-01-30 DIAGNOSIS — A419 Sepsis, unspecified organism: Secondary | ICD-10-CM | POA: Diagnosis not present

## 2023-01-30 DIAGNOSIS — L0390 Cellulitis, unspecified: Secondary | ICD-10-CM | POA: Diagnosis not present

## 2023-01-30 LAB — CBC WITH DIFFERENTIAL/PLATELET
Abs Immature Granulocytes: 0.02 10*3/uL (ref 0.00–0.07)
Basophils Absolute: 0 10*3/uL (ref 0.0–0.1)
Basophils Relative: 0 %
Eosinophils Absolute: 0.2 10*3/uL (ref 0.0–0.5)
Eosinophils Relative: 3 %
HCT: 40.7 % (ref 36.0–46.0)
Hemoglobin: 12.4 g/dL (ref 12.0–15.0)
Immature Granulocytes: 0 %
Lymphocytes Relative: 18 %
Lymphs Abs: 1.2 10*3/uL (ref 0.7–4.0)
MCH: 32.9 pg (ref 26.0–34.0)
MCHC: 30.5 g/dL (ref 30.0–36.0)
MCV: 108 fL — ABNORMAL HIGH (ref 80.0–100.0)
Monocytes Absolute: 0.7 10*3/uL (ref 0.1–1.0)
Monocytes Relative: 10 %
Neutro Abs: 4.6 10*3/uL (ref 1.7–7.7)
Neutrophils Relative %: 69 %
Platelets: 115 10*3/uL — ABNORMAL LOW (ref 150–400)
RBC: 3.77 MIL/uL — ABNORMAL LOW (ref 3.87–5.11)
RDW: 13.2 % (ref 11.5–15.5)
WBC: 6.8 10*3/uL (ref 4.0–10.5)
nRBC: 0 % (ref 0.0–0.2)

## 2023-01-30 LAB — GLUCOSE, CAPILLARY
Glucose-Capillary: 123 mg/dL — ABNORMAL HIGH (ref 70–99)
Glucose-Capillary: 127 mg/dL — ABNORMAL HIGH (ref 70–99)
Glucose-Capillary: 108 mg/dL — ABNORMAL HIGH (ref 70–99)
Glucose-Capillary: 96 mg/dL (ref 70–99)
Glucose-Capillary: 79 mg/dL (ref 70–99)

## 2023-01-30 LAB — BASIC METABOLIC PANEL
Anion gap: 5 (ref 5–15)
BUN: 24 mg/dL — ABNORMAL HIGH (ref 8–23)
CO2: 24 mmol/L (ref 22–32)
Calcium: 8 mg/dL — ABNORMAL LOW (ref 8.9–10.3)
Chloride: 110 mmol/L (ref 98–111)
Creatinine, Ser: 0.42 mg/dL — ABNORMAL LOW (ref 0.44–1.00)
GFR, Estimated: >60 mL/min (ref >60)
Glucose, Bld: 138 mg/dL — ABNORMAL HIGH (ref 70–99)
Potassium: 3.6 mmol/L (ref 3.5–5.1)
Sodium: 139 mmol/L (ref 135–145)

## 2023-01-30 LAB — CULTURE, BLOOD (ROUTINE X 2)

## 2023-01-30 MED ORDER — DOXYCYCLINE MONOHYDRATE 100 MG PO TABS: 100.0000 mg | ORAL_TABLET | Freq: Two times a day (BID) | ORAL | 0 refills | Status: AC

## 2023-01-30 NOTE — Care Management Important Message (Signed)
Important Message  Patient Details IM Letter given Name: [name] MRN: [id number] Date of Birth: 09/20/1940   Medicare Important Message Given:  Yes     Caren Macadam 01/30/2023, 9:58 AM

## 2023-01-30 NOTE — Discharge Summary (Signed)
Physician Discharge Summary   Patient: [name] MRN: [id number] DOB: 1941-04-02  Admit date:     01/26/2023  Discharge date: 01/30/23  Discharge Physician: Briant Cedar   PCP: [name], MD   Recommendations at discharge:   Follow-up with PCP in 1 week Follow-up with wound care  Discharge Diagnoses: Principal Problem:   Sepsis due to cellulitis [facility]) Active Problems:   Acute encephalopathy   HTN (hypertension)   Depression   Hyperlipemia   Dementia (HCC)   Benign essential HTN   Debility   Decubitus ulcer   Malnutrition of moderate degree    Hospital Course: [name] is a 82 y.o. female with medical history significant of hypertension, hyperlipidemia, prior stroke, depression who presented to the emergency department due to altered mental status.  In the ED, she was febrile to 101.7. Labs revealed WBC 20.9, platelets 127, INR 1.5.  CT chest abdomen pelvis was obtained which showed cholelithiasis and choledocholithiasis with mild dilation of the biliary tree. Examination revealed erythematous sacral decubitus ulcers.  Patient was admitted for further workup.  She was started on vancomycin and ceftriaxone. Patient lives at home with son and daughter-in-law, is bedbound and completely dependent on ADLs.     Today, patient denies any new complaints, very eager to be discharged.  G-tube was changed by IR on 01/29/2023, although no documentation in chart as of now.  Discussed with son about discharge plans, reports unavailable until this evening.  Social worker contacted on the need for ambulance transport back home    Assessment and Plan:  Sepsis likely secondary to cellulitis left upper extremity Sepsis present on admission with fever 101.7 and leukocytosis 20.9, now resolved UTI is ruled out.  UA is unremarkable Blood cultures negative to date S/p Rocephin--> switched to p.o. doxycycline to complete 7 days of antibiotics   Acute metabolic  encephalopathy Improved Secondary to above Dementia at baseline   Biliary ductal dilatation Right upper quadrant ultrasound: Cholelithiasis.  No sonographic evidence of acute cholecystitis.. Noted Dilated CBD Denies abdominal pain, LFTs within normal limits PCP to follow-up   Dementia Bedbound at baseline with contractures Namenda   Evaluation for G tube Per son, they use tube feedings nightly.  Dietitian consulted for orders Son would like G tubing to be evaluated while patient is at the hospital due to ?leaking IR consulted, G-tube was changed on 6/19   Stage II decubitus ulcers, present on admission Followed by outpatient wound clinic very closely Does not seem to be acutely infected Home health RN for chronic wound/sacral decubitus ulcer management   Hyperlipidemia Lipitor   Paroxysmal A-fib, Hx DVT and PE  Eliquis   Hypertension Lisinopril   GAD BuSpar         Pain control - [place] Controlled Substance Reporting System database was reviewed. and patient was instructed, not to drive, operate heavy machinery, perform activities at heights, swimming or participation in water activities or provide baby-sitting services while on Pain, Sleep and Anxiety Medications; until their outpatient Physician has advised to do so again. Also recommended to not to take more than prescribed Pain, Sleep and Anxiety Medications.    Consultants: IR Procedures performed: G-tube changed on 6/19 Disposition: Home Diet recommendation:  Tube feedings    DISCHARGE MEDICATION: Allergies as of 01/30/2023       Reactions   T-pa [alteplase] Swelling, Other (See Comments)   Does okay with it if pre-medicated with benadryl; limbs became swollen   Penicillins Other (See  Comments)   Unknown    Codeine Itching        Medication List     STOP taking these medications    mupirocin cream 2 % Commonly known as: BACTROBAN       TAKE these medications    acetaminophen  500 MG tablet Commonly known as: TYLENOL Take 1,000 mg by mouth every 6 (six) hours as needed for moderate pain or fever.   albuterol 108 (90 Base) MCG/ACT inhaler Commonly known as: VENTOLIN HFA Inhale 2 puffs into the lungs every 6 (six) hours as needed for wheezing or shortness of breath.   ascorbic acid 500 MG tablet Commonly known as: VITAMIN C Take 500 mg by mouth daily.   atorvastatin 20 MG tablet Commonly known as: LIPITOR Take 20 mg by mouth at bedtime.   benzonatate 100 MG capsule Commonly known as: TESSALON Take 100 mg by mouth 3 (three) times daily as needed for cough.   BIOTIN PO Take 1 capsule by mouth daily.   busPIRone 30 MG tablet Commonly known as: BUSPAR Take 15 mg by mouth 2 (two) times daily.   butalbital-acetaminophen-caffeine 50-325-40 MG tablet Commonly known as: FIORICET Take 0.5 tablets by mouth as needed for headache.   Centrum Silver 50+Women Tabs Take 1 tablet by mouth in the morning.   cholecalciferol 25 MCG (1000 UNIT) tablet Commonly known as: VITAMIN D3 Take 1,000 Units by mouth daily.   clotrimazole-betamethasone cream Commonly known as: LOTRISONE Apply 1 Application topically 2 (two) times daily. Apply to buttocks   doxycycline 100 MG tablet Commonly known as: ADOXA Take 1 tablet (100 mg total) by mouth 2 (two) times daily for 3 days.   Eliquis 5 MG Tabs tablet Generic drug: apixaban Take 5 mg by mouth in the morning and at bedtime.   Nutren 1.5 Liqd Place 600 mLs into feeding tube at bedtime. What changed: Another medication with the same name was changed. Make sure you understand how and when to take each.   ProSource Liqd Take 1 Capful by mouth in the morning, at noon, and at bedtime. What changed: Another medication with the same name was changed. Make sure you understand how and when to take each.   nutrition supplement (JUVEN) Pack Place 1 packet into feeding tube daily. What changed: Another medication with the same  name was changed. Make sure you understand how and when to take each.   feeding supplement Liqd Take 237 mLs by mouth 3 (three) times daily between meals. What changed: when to take this   ferrous sulfate 325 (65 FE) MG tablet Take 1 tablet (325 mg total) by mouth daily with breakfast.   FLEET LAXATIVE PO Take 1 Dose by mouth as needed (constipation).   bisacodyl 10 MG suppository Commonly known as: DULCOLAX Place 10 mg rectally as needed for moderate constipation.   fluticasone 50 MCG/ACT nasal spray Commonly known as: FLONASE Place 2 sprays into both nostrils daily as needed for allergies or rhinitis.   furosemide 20 MG tablet Commonly known as: LASIX Take 10 mg by mouth daily as needed for fluid or edema.   gentamicin cream 0.1 % Commonly known as: GARAMYCIN Apply 1 Application topically See admin instructions. Apply around the feeding tube with every change and every morning with wound dressing change   linaclotide 290 MCG Caps capsule Commonly known as: LINZESS Take 290 mcg by mouth daily before breakfast.   lisinopril 10 MG tablet Commonly known as: ZESTRIL Take 10 mg by mouth daily.  memantine 28 MG Cp24 24 hr capsule Commonly known as: NAMENDA XR Take 1 capsule (28 mg total) by mouth daily.   nitroGLYCERIN 0.4 MG SL tablet Commonly known as: NITROSTAT Place 1 tablet (0.4 mg total) under the tongue every 5 (five) minutes as needed for chest pain.   nystatin powder Generic drug: nystatin Apply 1 Application topically See admin instructions. Apply to buttocks 2 times a day (after applying clotrimazole-betamethasone cream)   OVER THE COUNTER MEDICATION 1 patch See admin instructions. Calcium Alginate with Antimicrobial Silver Sheet Dressing- Change dressing every morning (left "tunnel")   Oxycodone HCl 20 MG Tabs Take 10-20 mg by mouth 4 (four) times daily as needed (pain).   potassium chloride 10 MEQ tablet Commonly known as: KLOR-CON Take 5 mEq by mouth  See admin instructions. Take 5 mEq by mouth once a day only when taking Furosemide   Restasis 0.05 % ophthalmic emulsion Generic drug: cycloSPORINE Place 1 drop into both eyes daily as needed (dry eyes). What changed: reasons to take this   zinc gluconate 50 MG tablet Take 50 mg by mouth daily.        Follow-up Information     [name], MD. Schedule an appointment as soon as possible for a visit in 1 week(s).   Specialty: Internal Medicine Contact information: [address] [place] [address] [phone]                Discharge Exam: Ceasar Mons Weights   01/27/23 0000 01/28/23 0426  Weight: 62 kg 82.7 kg   General: NAD, chronically ill-appearing Cardiovascular: S1, S2 present Respiratory: CTAB Abdomen: Soft, nontender, nondistended, bowel sounds present Musculoskeletal: No bilateral pedal edema noted, noted bilateral upper extremity contractures Skin: Noted chronic decubitus ulcer Psychiatry: Normal mood   Condition at discharge: stable  The results of significant diagnostics from this hospitalization (including imaging, microbiology, ancillary and laboratory) are listed below for reference.   Imaging Studies: IR REPLACE G-TUBE SIMPLE WO FLUORO  Result Date: 01/29/2023 INDICATION: 82 year old female with chronic indwelling gastrostomy tube placed on 10/30/2020, with concerns for discoloration in external portion of tube. The patient's family requested a new gastrostomy tube. EXAM: REPLACEMENT OF GASTROSTOMY TUBE COMPARISON:  01/26/2023 MEDICATIONS: None. CONTRAST:  None. FLUOROSCOPY TIME:  None. COMPLICATIONS: None immediate. PROCEDURE: Informed written consent was obtained from the patient's family after a discussion of the risks, benefits and alternatives to treatment. Questions regarding the procedure were encouraged and answered. A timeout was performed prior to the initiation of the procedure. Viscous lidocaine was applied to the indwelling  gastrostomy tube site. The existing gastrostomy tube was removed intact and exchanged for a new 20-French MIC balloon inflatable gastrostomy tube. The balloon was inflated and disc was cinched. A dressing was placed. The patient tolerated the procedure well without immediate postprocedural complication. IMPRESSION: Successful replacement of a new 20-French gastrostomy tube. The new gastrostomy tube is ready for immediate use. Marliss Coots, MD [facility] Radiology Electronically Signed   By: Marliss Coots M.D.   On: 01/29/2023 15:21   CT HEAD WO CONTRAST ( )  Result Date: 01/27/2023 CLINICAL DATA:  Headache, tension-type headache EXAM: CT HEAD WITHOUT CONTRAST TECHNIQUE: Contiguous axial images were obtained from the base of the skull through the vertex without intravenous contrast. RADIATION DOSE REDUCTION: This exam was performed according to the departmental dose-optimization program which includes automated exposure control, adjustment of the mA and/or kV according to patient size and/or use of iterative reconstruction technique. COMPARISON:  10/21/2020  FINDINGS: Brain: Mild age related volume loss. No acute intracranial abnormality. Specifically, no hemorrhage, hydrocephalus, mass lesion, acute infarction, or significant intracranial injury. Vascular: No hyperdense vessel or unexpected calcification. Skull: No acute calvarial abnormality. Sinuses/Orbits: No acute findings Other: None IMPRESSION: Normal study. Electronically Signed   By: Charlett Nose M.D.   On: 01/27/2023 01:09   US Abdomen Limited RUQ (LIVER/GB)  Result Date: 01/27/2023 CLINICAL DATA:  Biliary disease EXAM: ULTRASOUND ABDOMEN LIMITED RIGHT UPPER QUADRANT COMPARISON:  CT 01/26/2023 FINDINGS: Gallbladder: Layering gallstones within the gallbladder measuring up to 1.3 cm. No wall thickening or sonographic Murphy sign. Common bile duct: Diameter: Dilated, measuring up to 16 mm. The distal  duct is difficult to visualize due to overlying bowel gas. Liver: 1.2 cm right hepatic cyst. Normal echotexture. No suspicious hepatic abnormality. Portal vein is patent on color Doppler imaging with normal direction of blood flow towards the liver. Other: None. IMPRESSION: Cholelithiasis.  No sonographic evidence of acute cholecystitis. Dilated common bile duct. The distal duct cannot be visualized due to overlying bowel gas. Electronically Signed   By: Charlett Nose M.D.   On: 01/27/2023 01:08   CT CHEST ABDOMEN PELVIS W CONTRAST  Result Date: 01/26/2023 CLINICAL DATA:  Sepsis.  Altered mental status. EXAM: CT CHEST, ABDOMEN, AND PELVIS WITH CONTRAST TECHNIQUE: Multidetector CT imaging of the chest, abdomen and pelvis was performed following the standard protocol during bolus administration of intravenous contrast. RADIATION DOSE REDUCTION: This exam was performed according to the departmental dose-optimization program which includes automated exposure control, adjustment of the mA and/or kV according to patient size and/or use of iterative reconstruction technique. CONTRAST:  OMNIPAQUE IOHEXOL 300 MG/ML  SOLN COMPARISON:  CT abdomen pelvis dated 11/28/2021. FINDINGS: CT CHEST FINDINGS Cardiovascular: There is no cardiomegaly or pericardial effusion. There is moderate atherosclerotic calcification of the thoracic aorta. No aneurysmal dilatation or dissection. The aorta is tortuous. The central pulmonary arteries appear patent. Mediastinum/Nodes: No hilar or mediastinal adenopathy. The esophagus is grossly unremarkable. No mediastinal fluid collection. Lungs/Pleura: Bibasilar subpleural atelectasis/scarring. Small left upper lobe calcified granuloma. No consolidative changes. There is no pleural effusion or pneumothorax. The central airways are patent. Musculoskeletal: Osteopenia with degenerative changes of the spine. No acute osseous pathology. CT ABDOMEN PELVIS FINDINGS No intra-abdominal free air or  free fluid. Hepatobiliary: Multiple small liver cysts and additional subcentimeter hypodense lesions which are too small to characterize. There is mild biliary ductal dilatation. There is tumefactive sludge and stones within the gallbladder. Probable noncalcified stone in the CBD. Further evaluation with ultrasound or MRCP is recommended. No pericholecystic fluid or evidence of acute cholecystitis by CT. Pancreas: The pancreas is unremarkable. Spleen: Normal in size without focal abnormality. Adrenals/Urinary Tract: The adrenal glands are unremarkable. There is no hydronephrosis on either side. There is symmetric enhancement and excretion of contrast by both kidneys. The urinary bladder is mildly distended despite presence of a Foley catheter. Air within the bladder introduced via the catheter. There is trabeculated appearance of the bladder wall which may be related to chronic bladder dysfunction. 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 There is large amount of dense stool throughout the colon there is no bowel obstruction or active inflammation. The appendix is normal. Vascular/Lymphatic: Moderate aortoiliac atherosclerotic disease. An infrarenal IVC filter is present. No portal venous gas. There is no adenopathy. Reproductive: The uterus is retroverted.  No adnexal masses. Other: Mild subcutaneous stranding of the pelvis. Musculoskeletal: Osteopenia with scoliosis and degenerative changes. Evidence  of prior posterior lumbar fusion. No acute osseous pathology. IMPRESSION: 1. No acute intrathoracic pathology. 2. Cholelithiasis and choledocholithiasis with mild dilatation of the biliary tree. Further evaluation with ultrasound or MRCP is recommended. 3. Constipation. No bowel obstruction. Normal appendix. 4.  Aortic Atherosclerosis (ICD10-I70.0). Electronically Signed   By: Elgie Collard M.D.   On: 01/26/2023 22:03   DG Chest Port 1 View  Result Date:  01/26/2023 CLINICAL DATA:  Questionable sepsis EXAM: PORTABLE CHEST 1 VIEW COMPARISON:  Chest x-ray 10/21/2020 FINDINGS: Loop recorder device overlies the left chest. The heart size and mediastinal contours are within normal limits. Both lungs are clear. The visualized skeletal structures are unremarkable. IMPRESSION: No active disease. Electronically Signed   By: Darliss Cheney M.D.   On: 01/26/2023 20:58    Microbiology: Results for orders placed or performed during the hospital encounter of 01/26/23  Blood Culture (routine x 2)     Status: None (Preliminary result)   Collection Time: 01/26/23  8:08 PM   Specimen: BLOOD  Result Value Ref Range Status   Specimen Description   Final    BLOOD BLOOD LEFT FOREARM Performed at [facility], [address] [place], [place] [address]    Special Requests   Final    BOTTLES DRAWN AEROBIC AND ANAEROBIC Blood Culture adequate volume Performed at [facility], [address] [place], [place] [address]    Culture   Final    NO GROWTH 4 DAYS Performed at [facility], [address], [place], [place] [address]    Report Status PENDING  Incomplete  Blood Culture (routine x 2)     Status: None (Preliminary result)   Collection Time: 01/26/23  8:08 PM   Specimen: BLOOD  Result Value Ref Range Status   Specimen Description   Final    BLOOD BLOOD RIGHT FOREARM Performed at [facility], [address], [place], [place] [address]    Special Requests   Final    BOTTLES DRAWN AEROBIC AND ANAEROBIC Blood Culture adequate volume Performed at [facility], [address], [place], [place] [address]    Culture   Final    NO GROWTH 4 DAYS Performed at [facility], [address], [place], [place] [address]    Report Status PENDING  Incomplete  Resp panel by RT-PCR (RSV, Flu A&B, Covid) Anterior Nasal Swab     Status: None   Collection Time: 01/26/23  9:03 PM    Specimen: Anterior Nasal Swab  Result Value Ref Range Status   SARS Coronavirus 2 by RT PCR NEGATIVE NEGATIVE Final    Comment: (NOTE) SARS-CoV-2 target nucleic acids are NOT DETECTED.  The SARS-CoV-2 RNA is generally detectable in upper respiratory specimens during the acute phase of infection. The lowest concentration of SARS-CoV-2 viral copies this assay can detect is 138 copies/mL. A negative result does not preclude SARS-Cov-2 infection and should not be used as the sole basis for treatment or other patient management decisions. A negative result may occur with  improper specimen collection/handling, submission of specimen other than nasopharyngeal swab, presence of viral mutation(s) within the areas targeted by this assay, and inadequate number of viral copies(<138 copies/mL). A negative result must be combined with clinical observations, patient history, and epidemiological information. The expected result is Negative.  Fact Sheet for Patients:  BloggerCourse.com  Fact Sheet for Healthcare Providers:  SeriousBroker.it  This test is no t yet approved or cleared by  the [employer] and  has been authorized for detection and/or diagnosis of SARS-CoV-2 by FDA under an Emergency Use Authorization (EUA). This EUA will remain  in effect (meaning this test can be used) for the duration of the COVID-19 declaration under Section 564(b)(1) of the Act, 21 U.S.C.section 360bbb-3(b)(1), unless the authorization is terminated  or revoked sooner.       Influenza A by PCR NEGATIVE NEGATIVE Final   Influenza B by PCR NEGATIVE NEGATIVE Final    Comment: (NOTE) The Xpert Xpress SARS-CoV-2/FLU/RSV plus assay is intended as an aid in the diagnosis of influenza from Nasopharyngeal swab specimens and should not be used as a sole basis for treatment. Nasal washings and aspirates are unacceptable for Xpert Xpress  SARS-CoV-2/FLU/RSV testing.  Fact Sheet for Patients: BloggerCourse.com  Fact Sheet for Healthcare Providers: SeriousBroker.it  This test is not yet approved or cleared by the [place] FDA and has been authorized for detection and/or diagnosis of SARS-CoV-2 by FDA under an Emergency Use Authorization (EUA). This EUA will remain in effect (meaning this test can be used) for the duration of the COVID-19 declaration under Section 564(b)(1) of the Act, 21 U.S.C. section 360bbb-3(b)(1), unless the authorization is terminated or revoked.     Resp Syncytial Virus by PCR NEGATIVE NEGATIVE Final    Comment: (NOTE) Fact Sheet for Patients: BloggerCourse.com  Fact Sheet for Healthcare Providers: SeriousBroker.it  This test is not yet approved or cleared by the [place] FDA and has been authorized for detection and/or diagnosis of SARS-CoV-2 by FDA under an Emergency Use Authorization (EUA). This EUA will remain in effect (meaning this test can be used) for the duration of the COVID-19 declaration under Section 564(b)(1) of the Act, 21 U.S.C. section 360bbb-3(b)(1), unless the authorization is terminated or revoked.  Performed at [facility], [address] [place], [place] [address]   MRSA Next Gen by PCR, Nasal     Status: None   Collection Time: 01/28/23  6:31 AM   Specimen: Nasal Mucosa; Nasal Swab  Result Value Ref Range Status   MRSA by PCR Next Gen NOT DETECTED NOT DETECTED Final    Comment: (NOTE) The GeneXpert MRSA Assay (FDA approved for NASAL specimens only), is one component of a comprehensive MRSA colonization surveillance program. It is not intended to diagnose MRSA infection nor to guide or monitor treatment for MRSA infections. Test performance is not FDA approved in patients less than 31 years old. Performed at [facility], [address] [place], [place] [address]     Labs: CBC: Recent Labs  Lab 01/26/23 2015 01/27/23 0432 01/28/23 0537 01/29/23 0609 01/30/23 0548  WBC 20.9* 16.2* 10.6* 8.0 6.8  NEUTROABS 18.6*  --   --   --  4.6  HGB 13.5 12.1 11.6* 13.4 12.4  HCT 42.0 38.5 36.4 44.2 40.7  MCV 103.7* 104.9* 105.5* 109.1* 108.0*  PLT 127* 105* 104* 104* 115*   Basic Metabolic Panel: Recent Labs  Lab 01/26/23 2015 01/27/23 0432 01/28/23 0537 01/29/23 0609 01/30/23 0548  NA 137 137 141 140 139  K 3.9 3.6 3.3* 3.6 3.6  CL 103 106 110 109 110  CO2 27 27 28 25 24   GLUCOSE 114* 85 156* 133* 138*  BUN 22 18 22 21  24*  CREATININE 0.59 0.38* 0.42* 0.40* 0.42*  CALCIUM 8.7* 8.3* 8.3* 8.2* 8.0*  MG  --   --  2.0 2.2  --    Liver Function Tests:  Recent Labs  Lab 01/26/23 2015 01/29/23 0609  AST 24 22  ALT 21 20  ALKPHOS 55 50  BILITOT 1.2 0.5  PROT 6.5 6.0*  ALBUMIN 2.7* 2.5*   CBG: Recent Labs  Lab 01/29/23 2013 01/29/23 2351 01/30/23 0402 01/30/23 0723 01/30/23 1135  GLUCAP 79 112* 123* 127* 108*    Discharge time spent: greater than 30 minutes.  Signed: Briant Cedar, MD [facility] 01/30/2023

## 2023-01-30 NOTE — Progress Notes (Addendum)
Nutrition Follow-up  DOCUMENTATION CODES:   Non-severe (moderate) malnutrition in context of chronic illness  INTERVENTION:   Continue home tube feeding regimen via PEG: -Osmolite 1.5 @ 50 ml/hr x 12 hours (2000-0800) -60 ml Prosource TF 20 TID -Provides 1140 kcals, 97g protein and 457 ml H2O  **Patient to resume Nutren 1.5 at home.   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  Ongoing.  GOAL:   Patient will meet greater than or equal to 90% of their needs  Meeting with TF  MONITOR:   Labs, Weight trends, TF tolerance, I & O's, Skin  ASSESSMENT:   82 y.o. female with medical history significant of hypertension, hyperlipidemia, prior stroke, depression who presented to the emergency department due to altered mental status.  Patient reportedly lives at home with family and has wound care that helps take care of her chronic wounds.  6/19: IR replacement of G-tube  Patient in room sleeping, no family at bedside. Pt did have G-tube replaced yesterday. Continues to tolerate tube feeding regimen. Is consuming some PO, ~25% of breakfast this morning. Pt requires feeding assistance.  Admission weight: 136 lbs Last weight 6/18: 182 lbs. Needs updated weight to assess this weight trend.  Edema has not worsened. I/Os: +1078 ml since admit. Question accuracy.     Medications: Multivitamin with minerals daily  Labs reviewed: CBGs: 79-127   Diet Order:   Diet Order             DIET DYS 3 Room service appropriate? Yes; Fluid consistency: Thin  Diet effective now                   EDUCATION NEEDS:   No education needs have been identified at this time  Skin:  Skin Assessment: Reviewed RN Assessment  Last BM:  6/20- type 6  Height:   Ht Readings from Last 1 Encounters:  01/27/23 5\' 5"  (1.651 m)    Weight:   Wt Readings from Last 1 Encounters:  01/28/23 82.7 kg    BMI:  Body mass index is 30.34 kg/m.  Estimated Nutritional Needs:   Kcal:  1550-1750  Protein:  75-95g  Fluid:  1.7L/day   Tilda Franco, MS, RD, LDN Inpatient Clinical Dietitian Contact information available via Amion

## 2023-01-30 NOTE — Progress Notes (Signed)
[facility] here to transport patient home, all belongings that where left taken with patient. Patient remained alert and oriented. NAD,

## 2023-01-30 NOTE — TOC Transition Note (Signed)
Transition of Care [facility]) - CM/SW Discharge Note   Patient Details  Name: [name] MRN: [id number] Date of Birth: 1941-08-03  Transition of Care [facility]) CM/SW Contact:  Otelia Santee, LCSW Phone Number: 01/30/2023, 3:47 PM   Clinical Narrative:    Pt to return home with family. Pt will continue to receive [facility] w/ [facility]. [facility] will provide transportation at discharge. [facility] to pick pt up after 6pm.    Final next level of care: Home w Home Health Services Barriers to Discharge: Barriers Resolved   Patient Goals and CMS Choice CMS Medicare.gov Compare Post Acute Care list provided to:: Patient Represenative (must comment) Choice offered to / list presented to : [facility] POA / Guardian  Discharge Placement                         Discharge Plan and Services Additional resources added to the After Visit Summary for   In-house Referral: NA Discharge Planning Services: NA Post Acute Care Choice: Resumption of Svcs/PTA Provider          DME Arranged: N/A DME Agency: NA                  Social Determinants of Health (SDOH) Interventions SDOH Screenings   Food Insecurity: No Food Insecurity (01/28/2023)  Housing: Low Risk  (01/28/2023)  Transportation Needs: No Transportation Needs (01/28/2023)  Utilities: Not At Risk (01/28/2023)  Tobacco Use: Medium Risk (01/29/2023)     Readmission Risk Interventions    01/30/2023    3:46 PM 01/27/2023    1:49 PM  Readmission Risk Prevention Plan  Transportation Screening  Complete  PCP or Specialist Appt within 5-7 Days  Complete  PCP or Specialist Appt within 3-5 Days Complete   Home Care Screening  Complete  Medication Review (RN CM)  Complete  HRI or Home Care Consult Complete   Social Work Consult for Recovery Care Planning/Counseling Complete   Palliative Care Screening Complete   Medication Review Oceanographer) Complete

## 2023-01-31 LAB — CULTURE, BLOOD (ROUTINE X 2)
Culture: NO GROWTH
Culture: NO GROWTH
Special Requests: ADEQUATE

## 2023-02-10 NOTE — Progress Notes (Signed)
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 01/01/2023: Telehealth visit with the patient via her home health nurse [name]. The case manager and I participated in the call from our office in the wound care center. The patient and her nurse were at the patient's home. Photos were provided by [name] [name]. The pinpoint area that had opened during our last visit has now closed again. The tunnel that has been getting packed is smaller and closing up. Patient History Unable to Obtain Patient History due to Dementia. Information obtained from Patient. Allergies codeine, alteplase Family History Cancer - Siblings, Lung Disease - Siblings, No family history of Diabetes, Heart Disease, Hereditary Spherocytosis, Hypertension, Kidney Disease, Seizures, Stroke, Thyroid Problems, Tuberculosis. Social  History Former smoker, Marital Status - Widowed, Alcohol Use - Never, Drug Use - No History, Caffeine Use - Daily. Medical History Eyes Denies history of Cataracts, Glaucoma, Optic Neuritis Ear/Nose/Mouth/Throat Denies history of Chronic sinus problems/congestion, Middle ear problems Hematologic/Lymphatic Denies history of Anemia, Hemophilia, Human Immunodeficiency Virus, Lymphedema, Sickle Cell Disease Respiratory Denies history of Aspiration, Asthma, Chronic Obstructive Pulmonary Disease (COPD), Pneumothorax, Sleep Apnea, Tuberculosis Cardiovascular Patient has history of Hypertension Beezley, Sharin Grave ([id number]) [id number] Page 8 of 11 Denies history of Angina, Arrhythmia, Congestive Heart Failure, Coronary Artery Disease, Deep Vein Thrombosis, Hypotension, Myocardial Infarction, Peripheral Arterial Disease, Peripheral Venous Disease, Phlebitis, Vasculitis Gastrointestinal Denies history of Cirrhosis , Colitis, Crohns, Hepatitis A, Hepatitis B, Hepatitis C Endocrine Denies history of Type I Diabetes, Type II Diabetes Genitourinary Denies history of End Stage Renal Disease Immunological Denies history of Lupus Erythematosus, Raynauds, Scleroderma Integumentary (Skin) Denies history of History of Burn Musculoskeletal Denies history of Gout, Rheumatoid Arthritis, Osteoarthritis, Osteomyelitis Neurologic Patient has history of Dementia Denies history of Neuropathy, Quadriplegia, Paraplegia, Seizure Disorder Oncologic Denies history of Received Chemotherapy, Received Radiation Psychiatric Denies history of Anorexia/bulimia, Confinement Anxiety Hospitalization/Surgery History - OR debridement/ metal plate removal 56/21. Review of Systems (ROS) Constitutional Symptoms (General Health) Denies complaints or symptoms of Fatigue, Fever, Chills, Marked Weight Change. Objective Constitutional Vitals Time Taken: 10:15 AM, Temperature: 98.2 F, Pulse: 74 bpm,  Respiratory Rate: 16 breaths/min, Blood Pressure: 132/74 mmHg, Pulse Oximetry: 98 %. General Notes: vital per home health RN General Notes: 01/01/2023: Wound evaluation based upon photos provided by the home  health nurse and description given by same. The pinhole opening adjacent to the area that we are packing has closed again. No purulent drainage identified. The tunnel that is being packed has contracted. Integumentary (Hair, Skin) Wound #5 status is Open. Original cause of wound was Gradually Appeared. The date acquired was: 09/12/2020. The wound has been in treatment 111 weeks. The wound is located on the Left Gluteus. The wound measures 0.1cm length x 0.3cm width x 0.1cm depth; 0.024cm^2 area and 0.002cm^3 volume. There is Fat Layer (Subcutaneous Tissue) exposed. There is no undermining noted, however, there is tunneling at 6:00 with a maximum distance of 0.5cm. There is a small amount of serous drainage noted. The wound margin is well defined and not attached to the wound base. There is small (1-33%) red granulation within the wound bed. There is no necrotic tissue within the wound bed. The periwound skin appearance had no abnormalities noted for texture. The periwound skin appearance had no abnormalities noted for moisture. The periwound skin appearance had no abnormalities noted for color. Periwound temperature was noted as No Abnormality. Assessment Active Problems ICD-10 Pressure ulcer of left buttock, stage 3 Plan Follow-up Appointments: Return appointment in 1 month. - Telehealth Wed. 6/19 @ 10:15 am Bathing/ Shower/ Hygiene: May shower and wash wound with soap and water. Off-Loading: Low air-loss mattress (Group 2) Turn and reposition every 2 hours Additional Orders / Instructions: Follow Nutritious Diet - Continue Juven and Nutrin Home Health: No change in wound care orders this week; continue Home Health for wound care. May utilize formulary equivalent dressing for wound  treatment orders unless otherwise specified. Other Home Health Orders/Instructions: - [facility] for Wound Care 3x week. [name] [name] [phone] WOUND #5: - Gluteus Wound Laterality: Left Cleanser: Wound Cleanser (Home Health) 3 x Per Week/30 Days Discharge Instructions: Cleanse the wound with wound cleanser prior to applying a clean dressing using gauze sponges, not tissue or cotton balls. [name] [name], [name] [name] ([id number]) [id number] Page 9 of 11 Topical: Gentamicin 3 x Per Week/30 Days Discharge Instructions: wipe on packing strip Prim Dressing: Iodoform packing strip 1/4 (in) 3 x Per Week/30 Days ary Discharge Instructions: impregnate with gentamicin ointment and Lightly pack into wound Secondary Dressing: Zetuvit Plus Silicone Border Dressing 4x4 (in/in) (Home Health) 3 x Per Week/30 Days Discharge Instructions: Apply silicone border or abd pad with tape over primary dressing as directed. 01/01/2023: The pinpoint area that had opened during our last visit has now closed again. The tunnel that has been getting packed is smaller and closing up. We will continue to pack the tunnel with gentamicin-impregnated Nu Gauze packing strips or iodoform packing strips, which ever is easier for [name] to obtain. We will have a follow-up telehealth visit in 1 month. Electronic Signature(s) Signed: 02/10/2023 1:55:30 PM By: Pearletha Alfred Signed: 02/10/2023 2:55:18 PM By: Duanne Guess MD FACS Previous Signature: 01/01/2023 6:02:30 PM Version By: Shawn Stall RN, BSN Previous Signature: 01/02/2023 10:38:40 AM Version By: Duanne Guess MD FACS Previous Signature: 01/01/2023 10:42:24 AM Version By: Duanne Guess MD FACS Entered By: Pearletha Alfred on 02/10/2023 13:55:30 -------------------------------------------------------------------------------- HxROS Details Patient Name: Date of Service: Sarah Hose D. 01/01/2023 10:15 A M Medical Record Number: [id number] Patient  Account Number: [id number] Date of Birth/Sex: Treating RN: [birth year] (82 y.o. [name] Primary Care Provider: [name] Other Clinician: Referring Provider: Treating Provider/Extender: Bud Face in Treatment: 111 Unable to Obtain Patient History due to Dementia Information Obtained From Patient Constitutional Symptoms (General Health) Complaints  and Symptoms: Negative for: Fatigue; Fever; Chills; Marked Weight Change Eyes Medical History: Negative for: Cataracts; Glaucoma; Optic Neuritis Ear/Nose/Mouth/Throat Medical History: Negative for: Chronic sinus problems/congestion; Middle ear problems Hematologic/Lymphatic Medical History: Negative for: Anemia; Hemophilia; Human Immunodeficiency Virus; Lymphedema; Sickle Cell Disease Respiratory Medical History: Negative for: Aspiration; Asthma; Chronic Obstructive Pulmonary Disease (COPD); Pneumothorax; Sleep Apnea; Tuberculosis Cardiovascular Medical History: Positive for: Hypertension Negative for: Angina; Arrhythmia; Congestive Heart Failure; Coronary Artery Disease; Deep Vein Thrombosis; Hypotension; Myocardial Infarction; Peripheral Arterial Disease; Peripheral Venous Disease; Phlebitis; Vasculitis Beshara, Sharin Grave ([id number]) [id number] Page 10 of 11 Gastrointestinal Medical History: Negative for: Cirrhosis ; Colitis; Crohns; Hepatitis A; Hepatitis B; Hepatitis C Endocrine Medical History: Negative for: Type I Diabetes; Type II Diabetes Genitourinary Medical History: Negative for: End Stage Renal Disease Immunological Medical History: Negative for: Lupus Erythematosus; Raynauds; Scleroderma Integumentary (Skin) Medical History: Negative for: History of Burn Musculoskeletal Medical History: Negative for: Gout; Rheumatoid Arthritis; Osteoarthritis; Osteomyelitis Neurologic Medical History: Positive for: Dementia Negative for: Neuropathy;  Quadriplegia; Paraplegia; Seizure Disorder Oncologic Medical History: Negative for: Received Chemotherapy; Received Radiation Psychiatric Medical History: Negative for: Anorexia/bulimia; Confinement Anxiety Immunizations Pneumococcal Vaccine: Received Pneumococcal Vaccination: No Implantable Devices None Hospitalization / Surgery History Type of Hospitalization/Surgery OR debridement/ metal plate removal 40/98 Family and Social History Cancer: Yes - Siblings; Diabetes: No; Heart Disease: No; Hereditary Spherocytosis: No; Hypertension: No; Kidney Disease: No; Lung Disease: Yes - Siblings; Seizures: No; Stroke: No; Thyroid Problems: No; Tuberculosis: No; Former smoker; Marital Status - Widowed; Alcohol Use: Never; Drug Use: No History; Caffeine Use: Daily; Financial Concerns: No; Food, Clothing or Shelter Needs: No; Support System Lacking: No; Transportation Concerns: No Electronic Signature(s) Signed: 01/01/2023 10:49:59 AM By: Duanne Guess MD FACS Signed: 01/01/2023 4:27:59 PM By: Zenaida Deed RN, BSN Entered By: Duanne Guess on 01/01/2023 10:35:51 Esbenshade, Sharin Grave ([id number]) [id number] Page 11 of 11 -------------------------------------------------------------------------------- SuperBill Details Patient Name: Date of Service: SASHIA, TATE 01/01/2023 Medical Record Number: [id number] Patient Account Number: [id number] Date of Birth/Sex: Treating RN: [birth year] (82 y.o. [name] Primary Care Provider: [name] Other Clinician: Referring Provider: Treating Provider/Extender: Bud Face in Treatment: 111 Diagnosis Coding ICD-10 Codes Code Description [phone] Pressure ulcer of left buttock, stage 3 Facility Procedures : CPT4 Code: [id number] Description: Telehealth originating site facility fee. Modifier: Quantity: 1 Physician Procedures : CPT4 Code Description Modifier [id number] - WC  PHYS LEVEL 4 - EST PT ICD-10 Diagnosis Description L89.323 Pressure ulcer of left buttock, stage 3 Quantity: 1 Electronic Signature(s) Signed: 01/01/2023 10:42:35 AM By: Duanne Guess MD FACS Entered By: Duanne Guess on 01/01/2023 10:42:34

## 2023-02-12 ENCOUNTER — Encounter (HOSPITAL_BASED_OUTPATIENT_CLINIC_OR_DEPARTMENT_OTHER): Payer: Medicare Other | Admitting: General Surgery

## 2023-02-12 DIAGNOSIS — L89103 Pressure ulcer of unspecified part of back, stage 3: Secondary | ICD-10-CM | POA: Diagnosis not present

## 2023-02-12 DIAGNOSIS — L89323 Pressure ulcer of left buttock, stage 3: Secondary | ICD-10-CM

## 2023-02-12 NOTE — Progress Notes (Addendum)
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 call from our office in the wound care center. The patient and her nurse were at the patient's home. Photos were provided by Maralyn Sago. The buttock ulcer is smaller again as of this visit. Maralyn Sago reports that the drainage is thinner and less copious. She has a couple of small purple marks adjacent to the wound that Maralyn Sago believes are secondary to tape irritation. 10/16/2022: Telehealth visit with the patient via her home health nurse Sarah.  The case manager and I participated in the call from our office in the wound care center. The patient and her nurse were at the patient's home. Photos were provided by Maralyn Sago. The buttock ulcer is shallower, but there is a "squishy area" adjacent that Maralyn Sago concerned may break open. Maralyn Sago reports that the drainage is looking a little bit more purulent. The purple marks that were present last time are no longer there. Byrd, JESSIE HABIGER ([id number]) [id number] Page 8 of 12 11/13/2022: Telehealth visit with the patient via her home health nurse Sarah. The case manager and I participated in the call from our office in the wound care center. The patient and her nurse were at the patient's home. Photos were provided by Maralyn Sago. A small pinpoint opening in the "squishy area" that Maralyn Sago was concerned about last visit did end up breaking open and is draining some seropurulent fluid. Otherwise there have been no significant changes. 01/01/2023: Telehealth visit with the patient via her home health nurse Sarah. The case manager and I participated in the call from our office in the wound care center. The patient and her nurse were at the patient's home. Photos were provided by Maralyn Sago. The pinpoint area that had opened during our last visit has now closed again. The tunnel that has been getting packed is smaller and closing up. 02/12/2023: This is a telehealth visit with the patient via her home health nurse Sarah. The case manager and I participated in the call from our office in the wound care  center. The patient and her nurse were at the patient's home. Since our last visit, the patient was hospitalized with altered mental status and was found to be septic. Her wounds were not thought to be the source of sepsis, but rather some cellulitis in her arm. She responded well to antibiotics and is now home again. Maralyn Sago reports that the wound in her back reopened just a bit while she was in the  hospital. We are still packing the tunnel on her buttock. Patient History Unable to Obtain Patient History due to Dementia. Information obtained from Patient. Allergies codeine, alteplase Family History Cancer - Siblings, Lung Disease - Siblings, No family history of Diabetes, Heart Disease, Hereditary Spherocytosis, Hypertension, Kidney Disease, Seizures, Stroke, Thyroid Problems, Tuberculosis. Social History Former smoker, Marital Status - Widowed, Alcohol Use - Never, Drug Use - No History, Caffeine Use - Daily. Medical History Eyes Denies history of Cataracts, Glaucoma, Optic Neuritis Ear/Nose/Mouth/Throat Denies history of Chronic sinus problems/congestion, Middle ear problems Hematologic/Lymphatic Denies history of Anemia, Hemophilia, Human Immunodeficiency Virus, Lymphedema, Sickle Cell Disease Respiratory Denies history of Aspiration, Asthma, Chronic Obstructive Pulmonary Disease (COPD), Pneumothorax, Sleep Apnea, Tuberculosis Cardiovascular Patient has history of Hypertension Denies history of Angina, Arrhythmia, Congestive Heart Failure, Coronary Artery Disease, Deep Vein Thrombosis, Hypotension, Myocardial Infarction, Peripheral Arterial Disease, Peripheral Venous Disease, Phlebitis, Vasculitis Gastrointestinal Denies history of Cirrhosis , Colitis, Crohns, Hepatitis A, Hepatitis B, Hepatitis C Endocrine Denies history of Type I Diabetes, Type II Diabetes Genitourinary Denies history of End Stage Renal Disease Immunological Denies history of Lupus Erythematosus, Raynauds, Scleroderma Integumentary (Skin) Denies history of History of Burn Musculoskeletal Denies history of Gout, Rheumatoid Arthritis, Osteoarthritis, Osteomyelitis Neurologic Patient has history of Dementia Denies history of Neuropathy, Quadriplegia, Paraplegia, Seizure Disorder Oncologic Denies history of Received Chemotherapy, Received Radiation Psychiatric Denies history of Anorexia/bulimia,  Confinement Anxiety Hospitalization/Surgery History - OR debridement/ metal plate removal 16/10. - cellulitis to arm. Objective Constitutional Vitals Time Taken: 10:30 AM, Temperature: 97.7 F, Pulse: 60 bpm, Respiratory Rate: 16 breaths/min, Blood Pressure: 134/68 mmHg, Pulse Oximetry: 95 %. General Notes: No photos were available today; I relied on the description of the patient's home health nurse who is intimately familiar with the wounds. The packed tunnel is closing up and the small open area on her back has improved since the patient's discharge from the hospital. Integumentary (Hair, Skin) Wound #5 status is Open. Original cause of wound was Gradually Appeared. The date acquired was: 09/12/2020. The wound has been in treatment 117 weeks. The wound is located on the Left Gluteus. The wound measures 0.1cm length x 0.2cm width x 0.8cm depth; 0.016cm^2 area and 0.013cm^3 volume. There is Fat Layer (Subcutaneous Tissue) exposed. There is no tunneling or undermining noted. There is a small amount of serous drainage noted. The wound margin is well defined and not attached to the wound base. There is small (1-33%) red granulation within the wound bed. There is no necrotic tissue within the wound bed. The periwound skin appearance had no abnormalities noted for texture. The periwound skin appearance had no abnormalities noted for moisture. The periwound skin [place], JERLINE LUDWIG ([id number]) [id number] Page 9 of 12 appearance had no abnormalities noted for color. Periwound temperature was noted as No Abnormality. Wound #7 status is Open. Original cause of wound was Pressure Injury. The date acquired was: 02/12/2023. The wound is located on the Back. The wound measures 0.2cm length x 0.3cm width x 0.1cm depth; 0.047cm^2 area and 0.005cm^3 volume. There is Fat Layer (Subcutaneous Tissue)  exposed. There is no tunneling or undermining noted. There is a small amount of serosanguineous  drainage noted. There is large (67-100%) red granulation within the wound bed. There is a small (1-33%) amount of necrotic tissue within the wound bed including Adherent Slough. Assessment Active Problems ICD-10 Pressure ulcer of left buttock, stage 3 Pressure ulcer of unspecified part of back, stage 3 Plan Follow-up Appointments: Return appointment in 1 month. - Telehealth Wed. 6/19 @ 10:15 am Bathing/ Shower/ Hygiene: May shower and wash wound with soap and water. Off-Loading: Low air-loss mattress (Group 2) Turn and reposition every 2 hours Additional Orders / Instructions: Follow Nutritious Diet - Continue Juven and Nutrin Home Health: No change in wound care orders this week; continue Home Health for wound care. May utilize formulary equivalent dressing for wound treatment orders unless otherwise specified. Other Home Health Orders/Instructions: - [facility] for Wound Care 3x week. [place] Dec [phone] The following medication(s) was prescribed: gentamicin topical 0.1 % ointment Apply to wound as directed with dressing changes starting 02/12/2023 WOUND #5: - Gluteus Wound Laterality: Left Cleanser: Wound Cleanser (Home Health) 3 x Per Week/30 Days Discharge Instructions: Cleanse the wound with wound cleanser prior to applying a clean dressing using gauze sponges, not tissue or cotton balls. Topical: Gentamicin 3 x Per Week/30 Days Discharge Instructions: wipe on packing strip Prim Dressing: Iodoform packing strip 1/4 (in) 3 x Per Week/30 Days ary Discharge Instructions: impregnate with gentamicin ointment and Lightly pack into wound Secondary Dressing: Zetuvit Plus Silicone Border Dressing 4x4 (in/in) (Home Health) 3 x Per Week/30 Days Discharge Instructions: Apply silicone border or abd pad with tape over primary dressing as directed. WOUND #7: - Back Wound Laterality: Cleanser: Wound Cleanser (Home Health) 3 x Per Week/30 Days Discharge Instructions: Cleanse the wound  with wound cleanser prior to applying a clean dressing using gauze sponges, not tissue or cotton balls. Topical: Gentamicin 3 x Per Week/30 Days Discharge Instructions: wipe on packing strip Prim Dressing: Maxorb Extra Calcium Alginate, 2x2 (in/in) 3 x Per Week/30 Days ary Discharge Instructions: Apply to wound bed as instructed Secondary Dressing: Zetuvit Plus Silicone Border Dressing 4x4 (in/in) (Home Health) 3 x Per Week/30 Days Discharge Instructions: Apply silicone border or abd pad with tape over primary dressing as directed. 02/12/2023: The patient's gluteal wound seems to be stable but while she was hospitalized, there was reopening of a part of the wound on her back. The patient's home health nurse states that this site is responding well to topical gentamicin with iodoform packing strips. We will continue to do this here as well as in the gluteal ulcer. Nurse requested a refill of gentamicin and this was sent. We will have another telehealth visit in a month. Electronic Signature(s) Signed: 02/14/2023 3:32:15 PM By: Shawn Stall RN, BSN Signed: 02/14/2023 3:32:56 PM By: Duanne Guess MD FACS Previous Signature: 02/12/2023 12:36:29 PM Version By: Duanne Guess MD FACS Entered By: Shawn Stall on 02/14/2023 15:31:01 -------------------------------------------------------------------------------- HxROS Details Patient Name: Date of Service: [name] 02/12/2023 10:30 A M Medical Record Number: [id number] Patient Account Number: [id number] Cowie, Sharin Grave ([id number]) [id number] Page 10 of 12 Date of Birth/Sex: Treating RN: 08-22-1940 (82 y.o. Orville Govern Primary Care Provider: Other Clinician: [name] Referring Provider: Treating Provider/Extender: Bud Face in Treatment: 117 Unable to Obtain Patient History due to Dementia Information Obtained From Patient Eyes Medical History: Negative for:  Cataracts; Glaucoma; Optic Neuritis Ear/Nose/Mouth/Throat Medical History: Negative for: Chronic sinus problems/congestion; Middle ear  problems Hematologic/Lymphatic Medical History: Negative for: Anemia; Hemophilia; Human Immunodeficiency Virus; Lymphedema; Sickle Cell Disease Respiratory Medical History: Negative for: Aspiration; Asthma; Chronic Obstructive Pulmonary Disease (COPD); Pneumothorax; Sleep Apnea; Tuberculosis Cardiovascular Medical History: Positive for: Hypertension Negative for: Angina; Arrhythmia; Congestive Heart Failure; Coronary Artery Disease; Deep Vein Thrombosis; Hypotension; Myocardial Infarction; Peripheral Arterial Disease; Peripheral Venous Disease; Phlebitis; Vasculitis Gastrointestinal Medical History: Negative for: Cirrhosis ; Colitis; Crohns; Hepatitis A; Hepatitis B; Hepatitis C Endocrine Medical History: Negative for: Type I Diabetes; Type II Diabetes Genitourinary Medical History: Negative for: End Stage Renal Disease Immunological Medical History: Negative for: Lupus Erythematosus; Raynauds; Scleroderma Integumentary (Skin) Medical History: Negative for: History of Burn Musculoskeletal Medical History: Negative for: Gout; Rheumatoid Arthritis; Osteoarthritis; Osteomyelitis Neurologic Medical History: Positive for: Dementia Negative for: Neuropathy; Quadriplegia; Paraplegia; Seizure Disorder Oncologic Medical History: Negative for: Received Chemotherapy; Received Radiation SHAYLAN, MISQUEZ ([id number]) [id number] Page 11 of 12 Psychiatric Medical History: Negative for: Anorexia/bulimia; Confinement Anxiety Immunizations Pneumococcal Vaccine: Received Pneumococcal Vaccination: No Implantable Devices None Hospitalization / Surgery History Type of Hospitalization/Surgery OR debridement/ metal plate removal 95/62 cellulitis to arm Family and Social History Cancer: Yes - Siblings; Diabetes: No; Heart  Disease: No; Hereditary Spherocytosis: No; Hypertension: No; Kidney Disease: No; Lung Disease: Yes - Siblings; Seizures: No; Stroke: No; Thyroid Problems: No; Tuberculosis: No; Former smoker; Marital Status - Widowed; Alcohol Use: Never; Drug Use: No History; Caffeine Use: Daily; Financial Concerns: No; Food, Clothing or Shelter Needs: No; Support System Lacking: No; Transportation Concerns: No Psychologist, prison and probation services) Signed: 02/12/2023 4:27:51 PM By: Redmond Pulling RN, BSN Signed: 02/12/2023 4:41:42 PM By: Duanne Guess MD FACS Entered By: Duanne Guess on 02/12/2023 12:27:09 -------------------------------------------------------------------------------- SuperBill Details Patient Name: Date of Service: [name] 02/12/2023 Medical Record Number: [id number] Patient Account Number: [id number] Date of Birth/Sex: Treating RN: [birth year] (82 y.o. F) Primary Care Provider: [name] Other Clinician: Referring Provider: Treating Provider/Extender: Bud Face in Treatment: 117 Diagnosis Coding ICD-10 Codes Code Description L89.103 Pressure ulcer of unspecified part of back, stage 3 L89.323 Pressure ulcer of left buttock, stage 3 Facility Procedures : CPT4 Description: Code 69629 Telephone evaluation and management not originating from a related E/M service provided within the previous 7 days nor leading to an E/M service or procedure within the next 24 hour, 5-10 minutes. Modifier: Quantity: 1 Physician Procedures : CPT4 Code Description Modifier [id number] - WC PHYS LEVEL 4 - EST PT ICD-10 Diagnosis Description L89.103 Pressure ulcer of unspecified part of back, stage 3 L89.323 Pressure ulcer of left buttock, stage 3 Quantity: 1 Electronic Signature(s) Signed: 03/06/2023 9:10:51 AM By: Pearletha Alfred Signed: 03/06/2023 3:34:47 PM By: Duanne Guess MD FACS Previous Signature: 02/12/2023 12:36:58 PM Version By: Duanne Guess MD FACS Cuadra,  Sharin Grave ([id number]) [id number] Page 12 of 12 Entered By: Pearletha Alfred on 03/06/2023 09:10:50

## 2023-02-12 NOTE — Progress Notes (Signed)
Amore, YOLTZIN STARKEY ([id number]) [id number] Page 1 of 3 Visit Report for 02/12/2023 Allergy List Details Patient Name: Date of Service: [name], [name] 02/12/2023 10:30 A M Medical Record Number: [id number] Patient Account Number: [id number] Date of Birth/Sex: Treating RN: 01/06/1941 (82 y.o. Orville Govern Primary Care Rion Schnitzer: [name] Other Clinician: Referring Dhanya Bogle: Treating Iram Astorino/Extender: Bud Face in Treatment: 117 Allergies Active Allergies codeine alteplase Allergy Notes Electronic Signature(s) Signed: 02/12/2023 4:27:51 PM By: Redmond Pulling RN, BSN Entered By: Redmond Pulling on 02/12/2023 11:57:03 -------------------------------------------------------------------------------- Patient/Caregiver Education Details Patient Name: Date of Service: [name] 7/3/2024andnbsp10:30 A M Medical Record Number: [id number] Patient Account Number: [id number] Date of Birth/Gender: Treating RN: [birth year] (82 y.o. Orville Govern Primary Care Physician: [name] Other Clinician: Referring Physician: Treating Physician/Extender: Bud Face in Treatment: 117 Education Assessment Education Provided To: Patient Education Topics Provided Wound/Skin Impairment: Methods: Explain/Verbal Responses: State content correctly [employer]) Signed: 02/12/2023 4:27:51 PM By: Redmond Pulling RN, BSN Entered By: Redmond Pulling on 02/12/2023 11:58:37 Heo, Sharin Grave ([id number]) [id number] Page 2 of 3 -------------------------------------------------------------------------------- Wound Assessment Details Patient Name: Date of Service: [name], [name] 02/12/2023 10:30 A M Medical Record Number: [id number] Patient Account Number: [id number] Date of Birth/Sex: Treating RN: 30-Jun-1941 (82 y.o. Orville Govern Primary Care Lucille Crichlow: [name]  Other Clinician: Referring Dustin Bumbaugh: Treating Mcihael Hinderman/Extender: Bud Face in Treatment: 117 Wound Status Wound Number: 5 Primary Etiology: Pressure Ulcer Wound Location: Left Gluteus Wound Status: Open Wounding Event: Gradually Appeared Comorbid History: Hypertension, Dementia Date Acquired: 09/12/2020 Weeks Of Treatment: 117 Clustered Wound: No Wound Measurements Length: (cm) 0.1 Width: (cm) 0.2 Depth: (cm) 0.8 Area: (cm) 0.016 Volume: (cm) 0.013 % Reduction in Area: 99.3% % Reduction in Volume: 99.4% Epithelialization: Large (67-100%) Tunneling: No Undermining: No Wound Description Classification: Category/Stage III Wound Margin: Well defined, not attached Exudate Amount: Small Exudate Type: Serous Exudate Color: amber Foul Odor After Cleansing: No Slough/Fibrino No Wound Bed Granulation Amount: Small (1-33%) Exposed Structure Granulation Quality: Red Fascia Exposed: No Necrotic Amount: None Present (0%) Fat Layer (Subcutaneous Tissue) Exposed: Yes Tendon Exposed: No Muscle Exposed: No Joint Exposed: No Bone Exposed: No Periwound Skin Texture Texture Color No Abnormalities Noted: Yes No Abnormalities Noted: Yes Moisture Temperature / Pain No Abnormalities Noted: Yes Temperature: No Abnormality Electronic Signature(s) Signed: 02/12/2023 4:27:51 PM By: Redmond Pulling RN, BSN Entered By: Redmond Pulling on 02/12/2023 11:56:56 -------------------------------------------------------------------------------- Wound Assessment Details Patient Name: Date of Service: Sarah Hose D. 02/12/2023 10:30 A M Medical Record Number: [id number] Patient Account Number: [id number] Date of Birth/Sex: Treating RN: 1940-09-15 (82 y.o. Orville Govern Primary Care Dekker Verga: [name] Other Clinician: Referring Alandria Butkiewicz: Treating Daune Divirgilio/Extender: Bud Face in Treatment: 117 Wound Status Wound Number: 7 Primary  Etiology: Pressure Ulcer Wound Location: Back Wound Status: Open Wounding Event: Pressure Injury Comorbid History: Hypertension, Dementia Date Acquired: 02/12/2023 [name], [name] ([id number]) [id number] Page 3 of 3 Weeks Of Treatment: 0 Clustered Wound: No Wound Measurements Length: (cm) 0.2 Width: (cm) 0.3 Depth: (cm) 0.1 Area: (cm) 0.047 Volume: (cm) 0.005 % Reduction in Area: % Reduction in Volume: Epithelialization: None Tunneling: No Undermining: No Wound Description Classification: Category/Stage II Exudate Amount: Small Exudate Type: Serosanguineous Exudate Color: red, brown Foul Odor After Cleansing: No Slough/Fibrino Yes Wound Bed Granulation Amount: Large (67-100%) Exposed Structure Granulation Quality: Red Fascia Exposed: No Necrotic Amount: Small (1-33%) Fat Layer (Subcutaneous Tissue) Exposed: Yes Necrotic Quality: Adherent Slough Tendon Exposed: No Muscle Exposed:  No Joint Exposed: No Bone Exposed: No Periwound Skin Texture Texture Color No Abnormalities Noted: No No Abnormalities Noted: No Moisture No Abnormalities Noted: No Electronic Signature(s) Signed: 02/12/2023 4:27:51 PM By: Redmond Pulling RN, BSN Entered By: Redmond Pulling on 02/12/2023 11:56:23 -------------------------------------------------------------------------------- Vitals Details Patient Name: Date of Service: Sarah Hose D. 02/12/2023 10:30 A M Medical Record Number: [id number] Patient Account Number: [id number] Date of Birth/Sex: Treating RN: [birth year] (82 y.o. Orville Govern Primary Care Jeriah Skufca: [name] Other Clinician: Referring Geoff Dacanay: Treating Feliciana Narayan/Extender: Bud Face in Treatment: 117 Vital Signs Time Taken: 10:30 Temperature (F): 97.7 Pulse (bpm): 60 Respiratory Rate (breaths/min): 16 Blood Pressure (mmHg): 134/68 Reference Range: 80 - 120 mg / dl Airway Pulse Oximetry (%): 95 Electronic  Signature(s) Signed: 02/12/2023 4:27:51 PM By: Redmond Pulling RN, BSN Entered By: Redmond Pulling on 02/12/2023 11:55:00

## 2023-03-02 ENCOUNTER — Other Ambulatory Visit: Payer: Self-pay

## 2023-03-02 ENCOUNTER — Emergency Department (HOSPITAL_COMMUNITY): Payer: Medicare Other

## 2023-03-02 ENCOUNTER — Inpatient Hospital Stay (HOSPITAL_COMMUNITY)
Admission: EM | Admit: 2023-03-02 | Discharge: 2023-03-07 | DRG: 689 | Disposition: A | Discharge status: Home Health | Payer: Medicare Other | Attending: Family Medicine | Admitting: Family Medicine

## 2023-03-02 ENCOUNTER — Encounter (HOSPITAL_COMMUNITY): Payer: Self-pay

## 2023-03-02 DIAGNOSIS — N3001 Acute cystitis with hematuria: Secondary | ICD-10-CM | POA: Diagnosis not present

## 2023-03-02 DIAGNOSIS — N3000 Acute cystitis without hematuria: Secondary | ICD-10-CM | POA: Diagnosis present

## 2023-03-02 DIAGNOSIS — Z789 Other specified health status: Secondary | ICD-10-CM | POA: Insufficient documentation

## 2023-03-02 DIAGNOSIS — I160 Hypertensive urgency: Secondary | ICD-10-CM | POA: Diagnosis present

## 2023-03-02 DIAGNOSIS — I480 Paroxysmal atrial fibrillation: Secondary | ICD-10-CM | POA: Diagnosis present

## 2023-03-02 DIAGNOSIS — H04123 Dry eye syndrome of bilateral lacrimal glands: Secondary | ICD-10-CM | POA: Diagnosis present

## 2023-03-02 DIAGNOSIS — M549 Dorsalgia, unspecified: Secondary | ICD-10-CM | POA: Diagnosis present

## 2023-03-02 DIAGNOSIS — Z86711 Personal history of pulmonary embolism: Secondary | ICD-10-CM

## 2023-03-02 DIAGNOSIS — R1310 Dysphagia, unspecified: Secondary | ICD-10-CM | POA: Diagnosis present

## 2023-03-02 DIAGNOSIS — F411 Generalized anxiety disorder: Secondary | ICD-10-CM | POA: Diagnosis present

## 2023-03-02 DIAGNOSIS — R532 Functional quadriplegia: Secondary | ICD-10-CM | POA: Diagnosis present

## 2023-03-02 DIAGNOSIS — M419 Scoliosis, unspecified: Secondary | ICD-10-CM | POA: Diagnosis present

## 2023-03-02 DIAGNOSIS — J45909 Unspecified asthma, uncomplicated: Secondary | ICD-10-CM | POA: Diagnosis present

## 2023-03-02 DIAGNOSIS — I482 Chronic atrial fibrillation, unspecified: Secondary | ICD-10-CM | POA: Diagnosis not present

## 2023-03-02 DIAGNOSIS — R17 Unspecified jaundice: Secondary | ICD-10-CM | POA: Diagnosis not present

## 2023-03-02 DIAGNOSIS — Z79899 Other long term (current) drug therapy: Secondary | ICD-10-CM | POA: Diagnosis not present

## 2023-03-02 DIAGNOSIS — B964 Proteus (mirabilis) (morganii) as the cause of diseases classified elsewhere: Secondary | ICD-10-CM | POA: Diagnosis present

## 2023-03-02 DIAGNOSIS — T402X1A Poisoning by other opioids, accidental (unintentional), initial encounter: Secondary | ICD-10-CM | POA: Diagnosis present

## 2023-03-02 DIAGNOSIS — R4182 Altered mental status, unspecified: Secondary | ICD-10-CM | POA: Diagnosis not present

## 2023-03-02 DIAGNOSIS — Z885 Allergy status to narcotic agent status: Secondary | ICD-10-CM

## 2023-03-02 DIAGNOSIS — Z888 Allergy status to other drugs, medicaments and biological substances status: Secondary | ICD-10-CM

## 2023-03-02 DIAGNOSIS — Z6830 Body mass index (BMI) 30.0-30.9, adult: Secondary | ICD-10-CM

## 2023-03-02 DIAGNOSIS — E669 Obesity, unspecified: Secondary | ICD-10-CM | POA: Diagnosis present

## 2023-03-02 DIAGNOSIS — L89322 Pressure ulcer of left buttock, stage 2: Secondary | ICD-10-CM | POA: Diagnosis present

## 2023-03-02 DIAGNOSIS — L89312 Pressure ulcer of right buttock, stage 2: Secondary | ICD-10-CM | POA: Diagnosis present

## 2023-03-02 DIAGNOSIS — Z66 Do not resuscitate: Secondary | ICD-10-CM | POA: Diagnosis present

## 2023-03-02 DIAGNOSIS — Z8673 Personal history of transient ischemic attack (TIA), and cerebral infarction without residual deficits: Secondary | ICD-10-CM

## 2023-03-02 DIAGNOSIS — E785 Hyperlipidemia, unspecified: Secondary | ICD-10-CM | POA: Diagnosis present

## 2023-03-02 DIAGNOSIS — I82409 Acute embolism and thrombosis of unspecified deep veins of unspecified lower extremity: Secondary | ICD-10-CM | POA: Insufficient documentation

## 2023-03-02 DIAGNOSIS — Z7401 Bed confinement status: Secondary | ICD-10-CM

## 2023-03-02 DIAGNOSIS — Z86718 Personal history of other venous thrombosis and embolism: Secondary | ICD-10-CM

## 2023-03-02 DIAGNOSIS — G928 Other toxic encephalopathy: Secondary | ICD-10-CM | POA: Diagnosis present

## 2023-03-02 DIAGNOSIS — G8929 Other chronic pain: Secondary | ICD-10-CM | POA: Diagnosis present

## 2023-03-02 DIAGNOSIS — Z8619 Personal history of other infectious and parasitic diseases: Secondary | ICD-10-CM

## 2023-03-02 DIAGNOSIS — F0394 Unspecified dementia, unspecified severity, with anxiety: Secondary | ICD-10-CM | POA: Diagnosis present

## 2023-03-02 DIAGNOSIS — Z87891 Personal history of nicotine dependence: Secondary | ICD-10-CM

## 2023-03-02 DIAGNOSIS — Z96652 Presence of left artificial knee joint: Secondary | ICD-10-CM | POA: Diagnosis present

## 2023-03-02 DIAGNOSIS — E7220 Disorder of urea cycle metabolism, unspecified: Secondary | ICD-10-CM | POA: Diagnosis present

## 2023-03-02 DIAGNOSIS — I10 Essential (primary) hypertension: Secondary | ICD-10-CM | POA: Diagnosis present

## 2023-03-02 DIAGNOSIS — L89152 Pressure ulcer of sacral region, stage 2: Secondary | ICD-10-CM | POA: Diagnosis present

## 2023-03-02 DIAGNOSIS — Z931 Gastrostomy status: Secondary | ICD-10-CM

## 2023-03-02 DIAGNOSIS — J9811 Atelectasis: Secondary | ICD-10-CM | POA: Diagnosis present

## 2023-03-02 DIAGNOSIS — F0390 Unspecified dementia without behavioral disturbance: Secondary | ICD-10-CM | POA: Diagnosis not present

## 2023-03-02 DIAGNOSIS — F32A Depression, unspecified: Secondary | ICD-10-CM | POA: Diagnosis present

## 2023-03-02 DIAGNOSIS — Z880 Allergy status to penicillin: Secondary | ICD-10-CM

## 2023-03-02 DIAGNOSIS — G9341 Metabolic encephalopathy: Secondary | ICD-10-CM | POA: Diagnosis present

## 2023-03-02 DIAGNOSIS — I825Z9 Chronic embolism and thrombosis of unspecified deep veins of unspecified distal lower extremity: Secondary | ICD-10-CM | POA: Diagnosis not present

## 2023-03-02 DIAGNOSIS — I69365 Other paralytic syndrome following cerebral infarction, bilateral: Secondary | ICD-10-CM | POA: Diagnosis not present

## 2023-03-02 DIAGNOSIS — Z7901 Long term (current) use of anticoagulants: Secondary | ICD-10-CM

## 2023-03-02 DIAGNOSIS — M1990 Unspecified osteoarthritis, unspecified site: Secondary | ICD-10-CM | POA: Diagnosis present

## 2023-03-02 LAB — I-STAT VENOUS BLOOD GAS, ED
Acid-Base Excess: 8 mmol/L — ABNORMAL HIGH (ref 0.0–2.0)
Bicarbonate: 31.4 mmol/L — ABNORMAL HIGH (ref 20.0–28.0)
Calcium, Ion: 1.12 mmol/L — ABNORMAL LOW (ref 1.15–1.40)
HCT: 47 % — ABNORMAL HIGH (ref 36.0–46.0)
Hemoglobin: 16 g/dL — ABNORMAL HIGH (ref 12.0–15.0)
O2 Saturation: 99 %
Potassium: 3.8 mmol/L (ref 3.5–5.1)
Sodium: 143 mmol/L (ref 135–145)
TCO2: 33 mmol/L — ABNORMAL HIGH (ref 22–32)
pCO2, Ven: 38.8 mmHg — ABNORMAL LOW (ref 44–60)
pH, Ven: 7.516 — ABNORMAL HIGH (ref 7.25–7.43)
pO2, Ven: 111 mmHg — ABNORMAL HIGH (ref 32–45)

## 2023-03-02 LAB — I-STAT CHEM 8, ED
BUN: 27 mg/dL — ABNORMAL HIGH (ref 8–23)
Calcium, Ion: 1.09 mmol/L — ABNORMAL LOW (ref 1.15–1.40)
Chloride: 104 mmol/L (ref 98–111)
Creatinine, Ser: 0.4 mg/dL — ABNORMAL LOW (ref 0.44–1.00)
Glucose, Bld: 130 mg/dL — ABNORMAL HIGH (ref 70–99)
HCT: 47 % — ABNORMAL HIGH (ref 36.0–46.0)
Hemoglobin: 16 g/dL — ABNORMAL HIGH (ref 12.0–15.0)
Potassium: 3.8 mmol/L (ref 3.5–5.1)
Sodium: 142 mmol/L (ref 135–145)
TCO2: 29 mmol/L (ref 22–32)

## 2023-03-02 LAB — URINALYSIS, ROUTINE W REFLEX MICROSCOPIC
Bilirubin Urine: NEGATIVE
Glucose, UA: NEGATIVE mg/dL
Ketones, ur: NEGATIVE mg/dL
Nitrite: NEGATIVE
Protein, ur: 100 mg/dL — AB
RBC / HPF: >50 RBC/hpf (ref 0–5)
Specific Gravity, Urine: 1.01 (ref 1.005–1.030)
WBC, UA: >50 WBC/hpf (ref 0–5)
pH: 8 (ref 5.0–8.0)

## 2023-03-02 LAB — RAPID URINE DRUG SCREEN, HOSP PERFORMED
Amphetamines: NOT DETECTED
Barbiturates: NOT DETECTED
Benzodiazepines: NOT DETECTED
Cocaine: NOT DETECTED
Opiates: NOT DETECTED
Tetrahydrocannabinol: NOT DETECTED

## 2023-03-02 LAB — COMPREHENSIVE METABOLIC PANEL
ALT: 28 U/L (ref 0–44)
AST: 48 U/L — ABNORMAL HIGH (ref 15–41)
Albumin: 2.9 g/dL — ABNORMAL LOW (ref 3.5–5.0)
Alkaline Phosphatase: 61 U/L (ref 38–126)
Anion gap: 12 (ref 5–15)
BUN: 19 mg/dL (ref 8–23)
CO2: 23 mmol/L (ref 22–32)
Calcium: 8.8 mg/dL — ABNORMAL LOW (ref 8.9–10.3)
Chloride: 103 mmol/L (ref 98–111)
Creatinine, Ser: 0.53 mg/dL (ref 0.44–1.00)
GFR, Estimated: >60 mL/min (ref >60)
Glucose, Bld: 131 mg/dL — ABNORMAL HIGH (ref 70–99)
Potassium: 3.9 mmol/L (ref 3.5–5.1)
Sodium: 138 mmol/L (ref 135–145)
Total Bilirubin: 1.3 mg/dL — ABNORMAL HIGH (ref 0.3–1.2)
Total Protein: 6.5 g/dL (ref 6.5–8.1)

## 2023-03-02 LAB — CBC WITH DIFFERENTIAL/PLATELET
Abs Immature Granulocytes: 0.05 10*3/uL (ref 0.00–0.07)
Basophils Absolute: 0 10*3/uL (ref 0.0–0.1)
Basophils Relative: 0 %
Eosinophils Absolute: 0.1 10*3/uL (ref 0.0–0.5)
Eosinophils Relative: 1 %
HCT: 46.8 % — ABNORMAL HIGH (ref 36.0–46.0)
Hemoglobin: 14.8 g/dL (ref 12.0–15.0)
Immature Granulocytes: 0 %
Lymphocytes Relative: 8 %
Lymphs Abs: 1 10*3/uL (ref 0.7–4.0)
MCH: 33 pg (ref 26.0–34.0)
MCHC: 31.6 g/dL (ref 30.0–36.0)
MCV: 104.5 fL — ABNORMAL HIGH (ref 80.0–100.0)
Monocytes Absolute: 0.6 10*3/uL (ref 0.1–1.0)
Monocytes Relative: 5 %
Neutro Abs: 10.3 10*3/uL — ABNORMAL HIGH (ref 1.7–7.7)
Neutrophils Relative %: 86 %
Platelets: 91 10*3/uL — ABNORMAL LOW (ref 150–400)
RBC: 4.48 MIL/uL (ref 3.87–5.11)
RDW: 13.5 % (ref 11.5–15.5)
Smear Review: DECREASED
WBC: 12 10*3/uL — ABNORMAL HIGH (ref 4.0–10.5)
nRBC: 0 % (ref 0.0–0.2)

## 2023-03-02 LAB — CBG MONITORING, ED: Glucose-Capillary: 132 mg/dL — ABNORMAL HIGH (ref 70–99)

## 2023-03-02 LAB — AMMONIA: Ammonia: 40 umol/L — ABNORMAL HIGH (ref 9–35)

## 2023-03-02 LAB — TROPONIN I (HIGH SENSITIVITY): Troponin I (High Sensitivity): 9 ng/L (ref <18)

## 2023-03-02 LAB — ETHANOL: Alcohol, Ethyl (B): <10 mg/dL (ref <10)

## 2023-03-02 LAB — MAGNESIUM: Magnesium: 2.2 mg/dL (ref 1.7–2.4)

## 2023-03-02 MED ORDER — SODIUM CHLORIDE 0.9 % IV SOLN
2.0000 g | Freq: Once | INTRAVENOUS | Status: AC
  Administered 2023-03-02: 2 g via INTRAVENOUS
  Filled 2023-03-02: qty 20

## 2023-03-02 MED ORDER — ACETAMINOPHEN 325 MG PO TABS
650.0000 mg | ORAL_TABLET | Freq: Four times a day (QID) | ORAL | Status: DC | PRN
  Administered 2023-03-03 – 2023-03-07 (×4): 650 mg via ORAL
  Filled 2023-03-02 (×4): qty 2

## 2023-03-02 MED ORDER — SODIUM CHLORIDE 0.9% FLUSH: 3.0000 mL | INTRAVENOUS | Status: DC | PRN

## 2023-03-02 MED ORDER — PROSIGHT PO TABS
1.0000 | ORAL_TABLET | Freq: Every day | ORAL | Status: DC
  Administered 2023-03-03 – 2023-03-07 (×5): 1 via ORAL
  Filled 2023-03-02 (×5): qty 1

## 2023-03-02 MED ORDER — SODIUM CHLORIDE 0.9% FLUSH
3.0000 mL | Freq: Two times a day (BID) | INTRAVENOUS | Status: DC
  Administered 2023-03-03 – 2023-03-07 (×10): 3 mL via INTRAVENOUS

## 2023-03-02 MED ORDER — NITROGLYCERIN 0.4 MG SL SUBL: 0.4000 mg | SUBLINGUAL_TABLET | SUBLINGUAL | Status: DC | PRN

## 2023-03-02 MED ORDER — SODIUM CHLORIDE 0.9 % IV SOLN
1.0000 g | INTRAVENOUS | Status: DC
  Administered 2023-03-03 – 2023-03-04 (×2): 1 g via INTRAVENOUS
  Filled 2023-03-02 (×2): qty 10

## 2023-03-02 MED ORDER — FLUTICASONE PROPIONATE 50 MCG/ACT NA SUSP: 2.0000 | Freq: Every day | NASAL | Status: DC | PRN

## 2023-03-02 MED ORDER — FUROSEMIDE 20 MG PO TABS: 10.0000 mg | ORAL_TABLET | Freq: Every day | ORAL | Status: DC | PRN

## 2023-03-02 MED ORDER — HYDRALAZINE HCL 20 MG/ML IJ SOLN
10.0000 mg | Freq: Three times a day (TID) | INTRAMUSCULAR | Status: DC | PRN
  Administered 2023-03-03: 10 mg via INTRAVENOUS
  Filled 2023-03-02: qty 1

## 2023-03-02 MED ORDER — MEMANTINE HCL ER 28 MG PO CP24
28.0000 mg | ORAL_CAPSULE | Freq: Every day | ORAL | Status: DC
  Administered 2023-03-03 – 2023-03-07 (×5): 28 mg via ORAL
  Filled 2023-03-02 (×5): qty 1

## 2023-03-02 MED ORDER — NYSTATIN 100000 UNIT/GM EX POWD
1.0000 | Freq: Two times a day (BID) | CUTANEOUS | Status: DC
  Administered 2023-03-03 – 2023-03-07 (×9): 1 via TOPICAL
  Filled 2023-03-02: qty 15

## 2023-03-02 MED ORDER — SODIUM CHLORIDE 0.9 % IV SOLN: 1.0000 g | INTRAVENOUS | Status: DC

## 2023-03-02 MED ORDER — APIXABAN 5 MG PO TABS
5.0000 mg | ORAL_TABLET | Freq: Two times a day (BID) | ORAL | Status: DC
  Administered 2023-03-03 – 2023-03-07 (×10): 5 mg via ORAL
  Filled 2023-03-02 (×10): qty 1

## 2023-03-02 MED ORDER — GENTAMICIN SULFATE 0.1 % EX CREA: 1.0000 | TOPICAL_CREAM | CUTANEOUS | Status: DC | PRN

## 2023-03-02 MED ORDER — ENSURE ENLIVE PO LIQD: 237.0000 mL | Freq: Three times a day (TID) | ORAL | Status: DC

## 2023-03-02 MED ORDER — LACTATED RINGERS IV BOLUS
1000.0000 mL | Freq: Once | INTRAVENOUS | Status: AC
  Administered 2023-03-02: 1000 mL via INTRAVENOUS

## 2023-03-02 MED ORDER — SODIUM CHLORIDE 0.9 % IV SOLN: 250.0000 mL | INTRAVENOUS | Status: DC | PRN

## 2023-03-02 MED ORDER — DEXTROSE IN LACTATED RINGERS 5 % IV SOLN: INTRAVENOUS | Status: DC

## 2023-03-02 MED ORDER — CYCLOSPORINE 0.05 % OP EMUL: 1.0000 [drp] | Freq: Every day | OPHTHALMIC | Status: DC | PRN

## 2023-03-02 MED ORDER — CLOTRIMAZOLE-BETAMETHASONE 1-0.05 % EX CREA
1.0000 | TOPICAL_CREAM | Freq: Two times a day (BID) | CUTANEOUS | Status: DC
  Administered 2023-03-03 – 2023-03-07 (×9): 1 via TOPICAL
  Filled 2023-03-02 (×2): qty 15

## 2023-03-02 MED ORDER — BUSPIRONE HCL 10 MG PO TABS
15.0000 mg | ORAL_TABLET | Freq: Two times a day (BID) | ORAL | Status: DC
  Administered 2023-03-03 – 2023-03-07 (×9): 15 mg via ORAL
  Filled 2023-03-02 (×9): qty 2

## 2023-03-02 MED ORDER — ACETAMINOPHEN 650 MG RE SUPP: 650.0000 mg | Freq: Four times a day (QID) | RECTAL | Status: DC | PRN

## 2023-03-02 MED ORDER — ALBUTEROL SULFATE (2.5 MG/3ML) 0.083% IN NEBU: 3.0000 mL | INHALATION_SOLUTION | Freq: Four times a day (QID) | RESPIRATORY_TRACT | Status: DC | PRN

## 2023-03-02 MED ORDER — LISINOPRIL 10 MG PO TABS
10.0000 mg | ORAL_TABLET | Freq: Every day | ORAL | Status: DC
  Administered 2023-03-03 (×2): 10 mg via ORAL
  Filled 2023-03-02 (×2): qty 1

## 2023-03-02 MED ORDER — ATORVASTATIN CALCIUM 40 MG PO TABS
20.0000 mg | ORAL_TABLET | Freq: Every day | ORAL | Status: DC
  Administered 2023-03-03 – 2023-03-06 (×5): 20 mg via ORAL
  Filled 2023-03-02 (×5): qty 1

## 2023-03-02 MED ORDER — CENTRUM SILVER 50+WOMEN PO TABS: 1.0000 | ORAL_TABLET | Freq: Every morning | ORAL | Status: DC

## 2023-03-02 NOTE — ED Provider Notes (Signed)
[place] EMERGENCY DEPARTMENT AT [facility] Provider Note   CSN: [id number] Arrival date & time: 03/02/23  1347     History  Chief Complaint  Patient presents with   Altered Mental Status    Possible overdose    [name] is a 82 y.o. female with PMH as listed below who presents BIB [facility] from home for a possible overdose. 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 They called EMS stating she was unresponsive so they gave a dose of Narcan. When EMS arrived pt was laying on her side and her son was digging out feces with a stirring stick. Pt was alert and oriented when EMS arrived shortly after they got there pt had an episode of vomiting and has been lethargic ever since. Patient reportedly getting weaker over the last few days and was unresponsive this morning. Patient remains altered and weak here in the emergency permit.   On my assessment patient is responsive only to her name and cannot respond to any other questions. Cannot answer if she has pain. Eyes open to voice.  No family at bedside for history available on my evaluation.  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 Authorizing Provider  acetaminophen (TYLENOL) 500 MG tablet Take 1,000 mg by mouth every 6 (six) hours as needed for moderate pain or fever.    [provider]  albuterol (VENTOLIN HFA) 108 (90 Base) MCG/ACT inhaler Inhale 2 puffs into the lungs every 6 (six) hours as needed for  wheezing or shortness of breath.    [provider]  atorvastatin (LIPITOR) 20 MG tablet Take 20 mg by mouth at bedtime. 09/15/20   [provider]  benzonatate (TESSALON) 100 MG capsule Take 100 mg by mouth 3 (three) times daily as needed for cough.    [provider]  BIOTIN PO Take 1 capsule by mouth daily.    [provider]  bisacodyl (DULCOLAX) 10 MG suppository Place 10 mg rectally as needed for moderate constipation.    [provider]  Bisacodyl (FLEET LAXATIVE PO) Take 1 Dose by mouth as needed (constipation).    [provider]  busPIRone (BUSPAR) 30 MG tablet Take 15 mg by mouth 2 (two) times daily. 09/22/20   [provider]  butalbital-acetaminophen-caffeine (FIORICET) 50-325-40 MG tablet Take 0.5 tablets by mouth as needed for headache.    [provider]  cholecalciferol (VITAMIN D3) 25 MCG (1000 UNIT) tablet Take 1,000 Units by mouth daily.    [provider]  clotrimazole-betamethasone (LOTRISONE) cream Apply 1 Application topically 2 (two) times daily. Apply to buttocks    [provider]  cycloSPORINE (RESTASIS) 0.05 % ophthalmic emulsion Place 1 drop into both eyes daily as needed (dry eyes). Patient taking differently: Place 1 drop into both eyes daily as needed (  for redness). 02/21/20   Layne Benton, NP  ELIQUIS 5 MG TABS tablet Take 5 mg by mouth in the morning and at bedtime. 09/26/20   [provider]  feeding supplement (ENSURE ENLIVE / ENSURE PLUS) LIQD Take 237 mLs by mouth 3 (three) times daily between meals. Patient taking differently: Take 237 mLs by mouth daily. 05/30/20   Swayze, Ava, DO  ferrous sulfate 325 (65 FE) MG tablet Take 1 tablet (325 mg total) by mouth daily with breakfast. 08/06/20   Lewie Chamber, MD  fluticasone [facility]) 50 MCG/ACT nasal spray Place 2 sprays into both nostrils daily as needed for allergies or rhinitis.    [provider]   furosemide (LASIX) 20 MG tablet Take 10 mg by mouth daily as needed for fluid or edema.    [provider]  gentamicin cream (GARAMYCIN) 0.1 % Apply 1 Application topically See admin instructions. Apply around the feeding tube with every change and every morning with wound dressing change    [provider]  linaclotide (LINZESS) 290 MCG CAPS capsule Take 290 mcg by mouth daily before breakfast.    [provider]  lisinopril (ZESTRIL) 10 MG tablet Take 10 mg by mouth daily.    [provider]  memantine (NAMENDA XR) 28 MG CP24 24 hr capsule Take 1 capsule (28 mg total) by mouth daily. 03/09/20   Angiulli, Mcarthur Rossetti, PA-C  Multiple Vitamins-Minerals (CENTRUM SILVER 50+WOMEN) TABS Take 1 tablet by mouth in the morning.    [provider]  nitroGLYCERIN (NITROSTAT) 0.4 MG SL tablet Place 1 tablet (0.4 mg total) under the tongue every 5 (five) minutes as needed for chest pain. 09/17/19   [facility], Roe Rutherford, PA  nutrition supplement, JUVEN, (JUVEN) PACK Place 1 packet into feeding tube daily.    [provider]  Nutritional Supplements (NUTREN 1.5) LIQD Place 600 mLs into feeding tube at bedtime.    [provider]  Nutritional Supplements (PROSOURCE) LIQD Take 1 Capful by mouth in the morning, at noon, and at bedtime.    [provider]  nystatin powder Apply 1 Application topically See admin instructions. Apply to buttocks 2 times a day (after applying clotrimazole-betamethasone cream)    [provider]  OVER THE COUNTER MEDICATION 1 patch See admin instructions. Calcium Alginate with Antimicrobial Silver Sheet Dressing- Change dressing every morning (left "tunnel")    [provider]  Oxycodone HCl 20 MG TABS Take 10-20 mg by mouth 4 (four) times daily as needed (pain).    [provider]  potassium chloride (KLOR-CON) 10 MEQ tablet Take 5 mEq by mouth See admin instructions. Take 5 mEq by mouth once a day  only when taking Furosemide 09/19/20   [provider]  vitamin C (ASCORBIC ACID) 500 MG tablet Take 500 mg by mouth daily.    [provider]  zinc gluconate 50 MG tablet Take 50 mg by mouth daily.    [provider]  zolpidem (AMBIEN) 5 MG tablet Take 1 tablet (5 mg total) by mouth at bedtime as needed for sleep (Insomnia). Patient not taking: No sig reported 05/30/20 08/05/20  Swayze, Ava, DO      Allergies    T-pa [alteplase], Penicillins, and Codeine    Review of Systems   Review of Systems A 10 point review of systems was performed and is negative unless otherwise reported in HPI.  Physical Exam Updated Vital Signs BP (!) 175/89 (BP Location: Right Arm)   Pulse Marland Kitchen)  102   Temp 100.3 F (37.9 C)   Resp 20   SpO2 93%  Physical Exam General: Chronically ill-appearing female, lying in bed.  HEENT: PERRLA 2mm bilaterally, Sclera anicteric, dry mucous membranes, trachea midline.  Cardiology: RRR, no murmurs/rubs/gallops. BL radial and DP pulses equal bilaterally.  Resp: Normal respiratory rate and effort. CTAB, no wheezes, rhonchi, crackles.  Abd: Feeding tube in LUQ without surrounding erythema/induration. Soft, non-tender, non-distended. No rebound tenderness or guarding.  Sacrum: Two stage one and stage 2 ulcers without surrounding induration/fluctuance or purulent discharge.  MSK: No peripheral edema or signs of trauma. Extremities without deformity or TTP. No cyanosis or clubbing. Skin: warm, dry. No rashes or lesions. Back:  Neuro: A&Ox1, otherwise mostly unresponsive to questions. MAEs.  ED Results / Procedures / Treatments   Labs (all labs ordered are listed, but only abnormal results are displayed) Labs Reviewed  CBC WITH DIFFERENTIAL/PLATELET  COMPREHENSIVE METABOLIC PANEL  MAGNESIUM  URINALYSIS, ROUTINE W REFLEX MICROSCOPIC  RAPID URINE DRUG SCREEN, HOSP PERFORMED  ETHANOL  AMMONIA  I-STAT CHEM 8, ED  I-STAT VENOUS BLOOD GAS, ED  CBG  MONITORING, ED  TROPONIN I (HIGH SENSITIVITY)    EKG EKG Interpretation Date/Time:  Sunday March 02 2023 13:58:36 EDT Ventricular Rate:  101 PR Interval:  212 QRS Duration:  103 QT Interval:  361 QTC Calculation: 468 R Axis:   -28  Text Interpretation: Sinus tachycardia Borderline prolonged PR interval Borderline left axis deviation Confirmed by Vivi Barrack [phone]) on 03/02/2023 3:12:23 PM  Radiology No results found.  Procedures Procedures    Medications Ordered in ED Medications - No data to display  ED Course/ Medical Decision Making/ A&P                          Medical Decision Making Amount and/or Complexity of Data Reviewed Labs: ordered. Decision-making details documented in ED Course. Radiology: ordered.  Risk Decision regarding hospitalization.    This patient presents to the ED for concern of AMS, this involves an extensive number of treatment options, and is a complaint that carries with it a high risk of complications and morbidity.  I considered the following differential and admission for this acute, potentially life threatening condition.   MDM:    Ddx of acute altered mental status or encephalopathy considered but not limited to: -Intracranial abnormalities such as ICH, hydrocephalus, head trauma - no reported trauma,  -Infection such as UTI, PNA  -Toxic ingestion such as opioid overdose - reportedly woke up some with narcan administered by family, but remains altered here. Has had decline recently. Not bradypneic or apneic, does not need more narcan now.  -Electrolyte abnormalities or hyper/hypoglycemia -Hepatic encephalopathy or uremia -ACS or arrhythmia   Clinical Course as of 03/09/23 0946  Sun Mar 02, 2023  1449 Glucose-Capillary(!): 132 unremarkable [HN]  1652 I-stat chem 8, ED (not at [facility], [facility] or [facility])(!) [MK]    Clinical Course User Index [HN] Loetta Rough, MD [MK] Kommor, Wyn Forster, MD    Labs: I Ordered, and personally  interpreted labs.  The pertinent results include:  those listed above, pending labs  Imaging Studies ordered: I ordered imaging studies including CTH, CXR Pending at time of signout  Additional history obtained from chart review, EMS.    Cardiac Monitoring: The patient was maintained on a cardiac monitor.  I personally viewed and interpreted the cardiac monitored which showed an underlying rhythm of: NSR  Social Determinants of Health:  lives with family  Disposition:  Patient is signed out to the oncoming ED physician Dr. Posey Rea who is made aware of her history, presentation, exam, workup, and plan. Plan is to obtain labs/imaging and likely admit.    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   I have reviewed the patients home medicines and have made adjustments as needed  Problem List / ED Course: Problem List Items Addressed This Visit       Genitourinary   Acute cystitis   Other Visit Diagnoses     Altered mental status, unspecified altered mental status type    -  Primary                   This note was created using dictation software, which may contain spelling or grammatical errors.    Loetta Rough, MD 03/09/23 [phone]

## 2023-03-02 NOTE — ED Notes (Signed)
In and out cath attempted but unsuccessful... MD Kommer notified.Marland KitchenMarland Kitchen

## 2023-03-02 NOTE — ED Notes (Addendum)
ED TO INPATIENT HANDOFF REPORT  ED Nurse Name and Phone #: Vayda Dungee/ [phone]  S Name/Age/Gender [name] 82 y.o. female Room/Bed: 022C/022C  Code Status   Code Status: DNR  Home/SNF/Other Home Patient oriented to: self, place, and time Is this baseline? Yes   Triage Complete: Triage complete  Chief Complaint Acute metabolic encephalopathy [G93.41]  Triage Note Pt BIB [facility] from home for a possible overdose. 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 They called EMS stating she was unresponsive so they gave a dose of Narcan. When EMS arrived pt was laying on her side and her son was digging out poop with a stirring stick. Pt was alert and oriented when EMS arrived shortly after they got there pt had an episode of vomiting and has been lethargic ever since.    Allergies Allergies  Allergen Reactions   T-Pa [Alteplase] Swelling and Other (See Comments)    Does okay with it if pre-medicated with benadryl; limbs became swollen   Penicillins Other (See Comments)    Unknown    Codeine Itching    Level of Care/Admitting Diagnosis ED Disposition     ED Disposition  Admit   Condition  --   Comment  Hospital Area: [facility] [[id number]]  Level of Care: Telemetry Medical [104]  May admit patient to [facility] or [facility] if equivalent level of care is available:: No  Covid Evaluation: Asymptomatic - no recent exposure (last 10 days) testing not required  Diagnosis: Acute metabolic encephalopathy [[id number]]  Admitting Physician: Tereasa Coop [[id number]]  Attending Physician: Tereasa Coop [[id number]]  Certification:: I certify this patient will need inpatient services for at least 2 midnights  Estimated Length of Stay: 5          B Medical/Surgery History Past Medical History:  Diagnosis Date   Arthritis    Asthma    Chronic back pain    Depression    History of  pulmonary embolus (PE)    Hx-TIA (transient ischemic attack)    Hyperlipemia    Hypertension    Personal history of DVT (deep vein thrombosis)    Scoliosis    Sleep apnea    cpap - settingsi at 2    Stroke [facility])    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   Past Surgical History:  Procedure Laterality Date   ARTHROSCOPIC REPAIR ACL  02/03/2004   HARDWARE REMOVAL N/A 07/27/2020   Procedure: Lumbar Hardware removal with lumbar wound debridement;  Surgeon: Barnett Abu, MD;  Location: [facility] OR;  Service: Neurosurgery;  Laterality: N/A;   IR GASTROSTOMY TUBE MOD SED  10/30/2020   IR IVC FILTER PLMT / S&I /IMG GUID/MOD SED  05/10/2020   IR REPLACE G-TUBE SIMPLE WO FLUORO  01/29/2023   LEFT HEART CATH AND CORONARY ANGIOGRAPHY N/A 09/16/2019   Procedure: LEFT HEART CATH AND CORONARY ANGIOGRAPHY;  Surgeon: Yvonne Kendall, MD;  Location: [facility] INVASIVE CV LAB;  Service: Cardiovascular;  Laterality: N/A;   LOOP RECORDER INSERTION N/A 02/21/2020   Procedure: LOOP RECORDER INSERTION;  Surgeon: Duke Salvia, MD;  Location: [facility] INVASIVE CV LAB;  Service: Cardiovascular;  Laterality: N/A;   LUMBAR WOUND DEBRIDEMENT N/A 07/27/2020   Procedure: LUMBAR WOUND DEBRIDEMENT;  Surgeon: Barnett Abu, MD;  Location: [facility] OR;  Service: Neurosurgery;  Laterality: N/A;   OOPHORECTOMY  1970   SPINE SURGERY  03/31/2006   TOTAL KNEE  ARTHROPLASTY Left 05/17/2016   Procedure: LEFT TOTAL KNEE ARTHROPLASTY;  Surgeon: Kathryne Hitch, MD;  Location: [facility] ORS;  Service: Orthopedics;  Laterality: Left;  Adductor Block; Spinal to General   TUBAL LIGATION  1983     A IV Location/Drains/Wounds Patient Lines/Drains/Airways Status     Active Line/Drains/Airways     Name Placement date Placement time Site Days   Peripheral IV 03/02/23 Left;Posterior Wrist 03/02/23  1802  Wrist  less than 1   Gastrostomy/Enterostomy Gastrostomy 20 Fr. LUQ 01/29/23  1302  LUQ  32   Pressure Injury 10/19/20 Vertebral column Left;Mid Unstageable -  Full thickness tissue loss in which the base of the injury is covered by slough (yellow, tan, gray, green or brown) and/or eschar (tan, brown or black) in the wound bed. eschar d/t removal  10/19/20  2020  -- 864   Pressure Injury 10/19/20 Buttocks Left Stage 4 - Full thickness tissue loss with exposed bone, tendon or muscle. 7x2x3, tunneling at 12'oclock-6cm, 9'oclock-1.5cm. 10/19/20  2020  -- 864   Pressure Injury 10/19/20 Buttocks Mid Stage 3 -  Full thickness tissue loss. Subcutaneous fat may be visible but bone, tendon or muscle are NOT exposed. intragluteal cluster, 8.5x5 10/19/20  2020  -- 864   Pressure Injury 11/05/20 Labia Right;Left Stage 3 -  Full thickness tissue loss. Subcutaneous fat may be visible but bone, tendon or muscle are NOT exposed. open skin on labia 11/05/20  2230  -- 847   Wound / Incision (Open or Dehisced) 05/27/20 (MASD) Moisture Associated Skin Damage Perineum Right;Left 05/27/20  0610  Perineum  1009            Intake/Output Last 24 hours  Intake/Output Summary (Last 24 hours) at 03/02/2023 2350 Last data filed at 03/02/2023 2131 Gross per 24 hour  Intake 1004.8 ml  Output --  Net 1004.8 ml    Labs/Imaging Results for orders placed or performed during the hospital encounter of 03/02/23 (from the past 48 hour(s))  Ethanol     Status: None   Collection Time: 03/02/23  2:02 PM  Result Value Ref Range   Alcohol, Ethyl (B) <10 <10 mg/dL    Comment: (NOTE) Lowest detectable limit for serum alcohol is 10 mg/dL.  For medical purposes only. Performed at [facility], [address], [place], [place] [address]   Ammonia     Status: Abnormal   Collection Time: 03/02/23  2:02 PM  Result Value Ref Range   Ammonia 40 (H) 9 - 35 umol/L    Comment: Performed at [facility], [address], [place], [place] [address]  CBC with Differential     Status: Abnormal   Collection Time: 03/02/23  2:23 PM  Result Value Ref Range   WBC 12.0 (H) 4.0 - 10.5  K/uL   RBC 4.48 3.87 - 5.11 MIL/uL   Hemoglobin 14.8 12.0 - 15.0 g/dL   HCT 60.1 (H) 09.3 - 23.5 %   MCV 104.5 (H) 80.0 - 100.0 fL   MCH 33.0 26.0 - 34.0 pg   MCHC 31.6 30.0 - 36.0 g/dL   RDW 57.3 22.0 - 25.4 %   Platelets 91 (L) 150 - 400 K/uL    Comment: SPECIMEN CHECKED FOR CLOTS REPEATED TO VERIFY PLATELET COUNT CONFIRMED BY SMEAR    nRBC 0.0 0.0 - 0.2 %   Neutrophils Relative % 86 %   Neutro Abs 10.3 (H) 1.7 - 7.7 K/uL   Lymphocytes Relative 8 %  Lymphs Abs 1.0 0.7 - 4.0 K/uL   Monocytes Relative 5 %   Monocytes Absolute 0.6 0.1 - 1.0 K/uL   Eosinophils Relative 1 %   Eosinophils Absolute 0.1 0.0 - 0.5 K/uL   Basophils Relative 0 %   Basophils Absolute 0.0 0.0 - 0.1 K/uL   WBC Morphology MORPHOLOGY UNREMARKABLE    RBC Morphology MORPHOLOGY UNREMARKABLE    Smear Review PLATELETS APPEAR DECREASED    Immature Granulocytes 0 %   Abs Immature Granulocytes 0.05 0.00 - 0.07 K/uL    Comment: Performed at [facility], [address] [place], [place] [address]  Comprehensive metabolic panel     Status: Abnormal   Collection Time: 03/02/23  2:23 PM  Result Value Ref Range   Sodium 138 135 - 145 mmol/L   Potassium 3.9 3.5 - 5.1 mmol/L   Chloride 103 98 - 111 mmol/L   CO2 23 22 - 32 mmol/L   Glucose, Bld 131 (H) 70 - 99 mg/dL    Comment: Glucose reference range applies only to samples taken after fasting for at least 8 hours.   BUN 19 8 - 23 mg/dL   Creatinine, Ser 6.04 0.44 - 1.00 mg/dL   Calcium 8.8 (L) 8.9 - 10.3 mg/dL   Total Protein 6.5 6.5 - 8.1 g/dL   Albumin 2.9 (L) 3.5 - 5.0 g/dL   AST 48 (H) 15 - 41 U/L   ALT 28 0 - 44 U/L   Alkaline Phosphatase 61 38 - 126 U/L   Total Bilirubin 1.3 (H) 0.3 - 1.2 mg/dL   GFR, Estimated >54 >09 mL/min    Comment: (NOTE) Calculated using the CKD-EPI Creatinine Equation (2021)    Anion gap 12 5 - 15    Comment: Performed at [facility], [address], [place], [place] [address]  Magnesium     Status: None    Collection Time: 03/02/23  2:23 PM  Result Value Ref Range   Magnesium 2.2 1.7 - 2.4 mg/dL    Comment: Performed at [facility], [address], [place], [place] [address]  Troponin I (High Sensitivity)     Status: None   Collection Time: 03/02/23  2:23 PM  Result Value Ref Range   Troponin I (High Sensitivity) 9 <18 ng/L    Comment: (NOTE) Elevated high sensitivity troponin I (hsTnI) values and significant  changes across serial measurements may suggest ACS but many other  chronic and acute conditions are known to elevate hsTnI results.  Refer to the Links section for chest pain algorithms and additional  guidance. Performed at [facility], [address] [place], [place] [address]   POC CBG, ED     Status: Abnormal   Collection Time: 03/02/23  2:33 PM  Result Value Ref Range   Glucose-Capillary 132 (H) 70 - 99 mg/dL    Comment: Glucose reference range applies only to samples taken after fasting for at least 8 hours.  I-stat chem 8, ED (not at [facility], [facility] or [facility])     Status: Abnormal   Collection Time: 03/02/23  2:41 PM  Result Value Ref Range   Sodium 142 135 - 145 mmol/L   Potassium 3.8 3.5 - 5.1 mmol/L   Chloride 104 98 - 111 mmol/L   BUN 27 (H) 8 - 23 mg/dL   Creatinine, Ser 0.86 (L) 0.44 - 1.00 mg/dL   Glucose, Bld 578 (H) 70 - 99 mg/dL    Comment: Glucose reference  range applies only to samples taken after fasting for at least 8 hours.   Calcium, Ion 1.09 (L) 1.15 - 1.40 mmol/L   TCO2 29 22 - 32 mmol/L   Hemoglobin 16.0 (H) 12.0 - 15.0 g/dL   HCT 60.1 (H) 09.3 - 23.5 %  I-Stat venous blood gas, ([facility] ED, [facility], [facility])     Status: Abnormal   Collection Time: 03/02/23  2:42 PM  Result Value Ref Range   pH, Ven 7.516 (H) 7.25 - 7.43   pCO2, Ven 38.8 (L) 44 - 60 mmHg   pO2, Ven 111 (H) 32 - 45 mmHg   Bicarbonate 31.4 (H) 20.0 - 28.0 mmol/L   TCO2 33 (H) 22 - 32 mmol/L   O2 Saturation 99 %   Acid-Base Excess 8.0 (H) 0.0 - 2.0 mmol/L   Sodium 143 135 - 145 mmol/L    Potassium 3.8 3.5 - 5.1 mmol/L   Calcium, Ion 1.12 (L) 1.15 - 1.40 mmol/L   HCT 47.0 (H) 36.0 - 46.0 %   Hemoglobin 16.0 (H) 12.0 - 15.0 g/dL   Sample type VENOUS   Urinalysis, Routine w reflex microscopic -Urine, Clean Catch     Status: Abnormal   Collection Time: 03/02/23  9:45 PM  Result Value Ref Range   Color, Urine YELLOW YELLOW   APPearance CLOUDY (A) CLEAR   Specific Gravity, Urine 1.010 1.005 - 1.030   pH 8.0 5.0 - 8.0   Glucose, UA NEGATIVE NEGATIVE mg/dL   Hgb urine dipstick MODERATE (A) NEGATIVE   Bilirubin Urine NEGATIVE NEGATIVE   Ketones, ur NEGATIVE NEGATIVE mg/dL   Protein, ur 573 (A) NEGATIVE mg/dL   Nitrite NEGATIVE NEGATIVE   Leukocytes,Ua LARGE (A) NEGATIVE   RBC / HPF >50 0 - 5 RBC/hpf   WBC, UA >50 0 - 5 WBC/hpf   Bacteria, UA FEW (A) NONE SEEN   Squamous Epithelial / HPF 0-5 0 - 5 /HPF   Mucus PRESENT     Comment: Performed at [facility], [address], [place], [place] [address]  Rapid urine drug screen (hospital performed)     Status: None   Collection Time: 03/02/23  9:45 PM  Result Value Ref Range   Opiates NONE DETECTED NONE DETECTED   Cocaine NONE DETECTED NONE DETECTED   Benzodiazepines NONE DETECTED NONE DETECTED   Amphetamines NONE DETECTED NONE DETECTED   Tetrahydrocannabinol NONE DETECTED NONE DETECTED   Barbiturates NONE DETECTED NONE DETECTED    Comment: (NOTE) DRUG SCREEN FOR MEDICAL PURPOSES ONLY.  IF CONFIRMATION IS NEEDED FOR ANY PURPOSE, NOTIFY LAB WITHIN 5 DAYS.  LOWEST DETECTABLE LIMITS FOR URINE DRUG SCREEN Drug Class                     Cutoff (ng/mL) Amphetamine and metabolites    1000 Barbiturate and metabolites    200 Benzodiazepine                 200 Opiates and metabolites        300 Cocaine and metabolites        300 THC                            50 Performed at [facility], [address] [place], [place] [address]    DG Chest Portable 1 View  Result Date: 03/02/2023 CLINICAL DATA:   Altered mental status. EXAM: PORTABLE CHEST 1 VIEW COMPARISON:  Chest radiograph and CT 01/26/2023 FINDINGS: The cardiomediastinal silhouette is unchanged with normal heart size. Tortuosity and atherosclerotic calcification are noted of the thoracic aorta. Lung volumes are low with mild streaky opacities in the lung bases favored to reflect atelectasis. The interstitial markings are diffusely increased bilaterally with a slightly nodular appearance in the mid to lower lungs. Curvilinear lucency projecting over the left upper hemithorax likely reflects a skin fold. No definite pneumothorax or sizable pleural effusion is identified. Degenerative changes are noted at the shoulders. IMPRESSION: Low lung volumes with bibasilar atelectasis and diffusely increased interstitial markings which could reflect atypical infection or mild interstitial edema. Electronically Signed   By: Sebastian Ache M.D.   On: 03/02/2023 15:54   CT Head Wo Contrast  Result Date: 03/02/2023 CLINICAL DATA:  Mental status change, unknown cause. EXAM: CT HEAD WITHOUT CONTRAST TECHNIQUE: Contiguous axial images were obtained from the base of the skull through the vertex without intravenous contrast. RADIATION DOSE REDUCTION: This exam was performed according to the departmental dose-optimization program which includes automated exposure control, adjustment of the mA and/or kV according to patient size and/or use of iterative reconstruction technique. COMPARISON:  Head CT 01/27/2023 FINDINGS: Brain: There is no evidence of an acute infarct, intracranial hemorrhage, mass, midline shift, or extra-axial fluid collection. Mild cerebral atrophy is within normal limits for age. Cerebral white matter hypodensities are unchanged and nonspecific but compatible with mild chronic small vessel ischemic disease. A small chronic infarct in the superior left cerebellar hemisphere is unchanged. Vascular: Calcified atherosclerosis at the skull base. No hyperdense  vessel. Skull: No acute fracture or suspicious osseous lesion. Sinuses/Orbits: Visualized paranasal sinuses and mastoid air cells are clear. Bilateral cataract extraction. Other: None. IMPRESSION: 1. No evidence of acute intracranial abnormality. 2. Mild chronic small vessel ischemic disease. Electronically Signed   By: Sebastian Ache M.D.   On: 03/02/2023 15:50    Pending Labs Unresulted Labs (From admission, onward)     Start     Ordered   03/03/23 0500  Comprehensive metabolic panel  Tomorrow morning,   R        03/02/23 2345   03/03/23 0500  CBC  Tomorrow morning,   R        03/02/23 2345   03/02/23 2300  Urine Culture  Once,   URGENT       Question:  Indication  Answer:  Altered mental status (if no other cause identified)   03/02/23 2259            Vitals/Pain Today's Vitals   03/02/23 2045 03/02/23 2100 03/02/23 2130 03/02/23 2159  BP: (!) 176/72 (!) 164/69 (!) 159/70   Pulse: (!) 56 (!) 57 (!) 56   Resp: 10 20 12    Temp:    98.4 F (36.9 C)  TempSrc:    Oral  SpO2: 100% 100% 99%   PainSc:        Isolation Precautions No active isolations  Medications Medications  cefTRIAXone (ROCEPHIN) 2 g in sodium chloride 0.9 % 100 mL IVPB (2 g Intravenous New Bag/Given 03/02/23 2344)  atorvastatin (LIPITOR) tablet 20 mg (has no administration in time range)  furosemide (LASIX) tablet 10 mg (has no administration in time range)  lisinopril (ZESTRIL) tablet 10 mg (has no administration in time range)  nitroGLYCERIN (NITROSTAT) SL tablet 0.4 mg (has no administration in time range)  busPIRone (BUSPAR) tablet 15 mg (has no administration in time range)  memantine (NAMENDA XR) 24 hr capsule 28 mg (has  no administration in time range)  apixaban (ELIQUIS) tablet 5 mg (has no administration in time range)  feeding supplement (ENSURE ENLIVE / ENSURE PLUS) liquid 237 mL (has no administration in time range)  albuterol (VENTOLIN HFA) 108 (90 Base) MCG/ACT inhaler 2 puff (has no  administration in time range)  fluticasone (FLONASE) 50 MCG/ACT nasal spray 2 spray (has no administration in time range)  cycloSPORINE (RESTASIS) 0.05 % ophthalmic emulsion 1 drop (has no administration in time range)  gentamicin cream (GARAMYCIN) 0.1 % 1 Application (has no administration in time range)  nystatin (MYCOSTATIN/NYSTOP) topical powder 1 Application (has no administration in time range)  clotrimazole-betamethasone (LOTRISONE) cream 1 Application (has no administration in time range)  sodium chloride flush (NS) 0.9 % injection 3 mL (has no administration in time range)  sodium chloride flush (NS) 0.9 % injection 3 mL (has no administration in time range)  0.9 %  sodium chloride infusion (has no administration in time range)  dextrose 5 % in lactated ringers infusion (has no administration in time range)  hydrALAZINE (APRESOLINE) injection 10 mg (has no administration in time range)  acetaminophen (TYLENOL) tablet 650 mg (has no administration in time range)    Or  acetaminophen (TYLENOL) suppository 650 mg (has no administration in time range)  cefTRIAXone (ROCEPHIN) 1 g in sodium chloride 0.9 % 100 mL IVPB (has no administration in time range)  multivitamin (PROSIGHT) tablet 1 tablet (has no administration in time range)  lactated ringers bolus 1,000 mL (0 mLs Intravenous Stopped 03/02/23 2131)    Mobility Unknown if she walks at home      Focused Assessments     R Recommendations: See Admitting Provider Note  Report given to:   Additional Notes: Pt came from home. Pt lives with family who cares for her. She's being for a UTI. WBC is elevated at 12,000. Pt has a 20 G in the L posterior wrist. She got 1 L of LR and has rocephin going right now. Pt wears a brief and family went home earlier.

## 2023-03-02 NOTE — ED Triage Notes (Addendum)
Pt BIB [facility] from home for a possible overdose. 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 They called EMS stating she was unresponsive so they gave a dose of Narcan. When EMS arrived pt was laying on her side and her son was digging out poop with a stirring stick. Pt was alert and oriented when EMS arrived shortly after they got there pt had an episode of vomiting and has been lethargic ever since.

## 2023-03-02 NOTE — H&P (Incomplete)
History and Physical    [name] VWU:[id number] DOB: 1940/10/09 DOA: 03/02/2023  PCP: [name], MD   Patient coming from: {Blank single:19197::"Home","Clinic","SNF","ALF","Group Home","Hospice","***"}   Chief Complaint:  Chief Complaint  Patient presents with   Altered Mental Status    Possible overdose    HPI:  [name] is a 82 y.o. female with medical history significant of as show hypertension, atrial fibrillation on Eliquis, DVT and PE on Eliquis, prior stroke bedbound, sacral stage II ulcer, generalized anxiety disorder, and tube feeding dependent  all medication by mouth)   ED Course: ***  Review of Systems:  ROS  Past Medical History:  Diagnosis Date   Arthritis    Asthma    Chronic back pain    Depression    History of pulmonary embolus (PE)    Hx-TIA (transient ischemic attack)    Hyperlipemia    Hypertension    Personal history of DVT (deep vein thrombosis)    Scoliosis    Sleep apnea    cpap - settingsi at 2    Stroke [facility])    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    Past Surgical History:  Procedure Laterality Date   ARTHROSCOPIC REPAIR ACL  02/03/2004   HARDWARE REMOVAL N/A 07/27/2020   Procedure: Lumbar Hardware removal with lumbar wound debridement;  Surgeon: Barnett Abu, MD;  Location: [facility] OR;  Service: Neurosurgery;  Laterality: N/A;   IR GASTROSTOMY TUBE MOD SED  10/30/2020   IR IVC FILTER PLMT / S&I /IMG GUID/MOD SED  05/10/2020   IR REPLACE G-TUBE SIMPLE WO FLUORO  01/29/2023   LEFT HEART CATH AND CORONARY ANGIOGRAPHY N/A 09/16/2019   Procedure: LEFT HEART CATH AND CORONARY ANGIOGRAPHY;  Surgeon: Yvonne Kendall, MD;  Location: [facility] INVASIVE CV LAB;  Service: Cardiovascular;  Laterality: N/A;   LOOP RECORDER INSERTION N/A 02/21/2020   Procedure: LOOP RECORDER INSERTION;  Surgeon: Duke Salvia, MD;  Location: [facility] INVASIVE CV LAB;  Service: Cardiovascular;  Laterality: N/A;   LUMBAR WOUND DEBRIDEMENT N/A 07/27/2020   Procedure:  LUMBAR WOUND DEBRIDEMENT;  Surgeon: Barnett Abu, MD;  Location: [facility] OR;  Service: Neurosurgery;  Laterality: N/A;   OOPHORECTOMY  1970   SPINE SURGERY  03/31/2006   TOTAL KNEE ARTHROPLASTY Left 05/17/2016   Procedure: LEFT TOTAL KNEE ARTHROPLASTY;  Surgeon: Kathryne Hitch, MD;  Location: [facility] ORS;  Service: Orthopedics;  Laterality: Left;  Adductor Block; Spinal to General   TUBAL LIGATION  1983     reports that she quit smoking about 49 years ago. Her smoking use included cigarettes. She started smoking about 59 years ago. She has a 10 pack-year smoking history. She has never used smokeless tobacco. She reports that she does not drink alcohol and does not use drugs.  Allergies  Allergen Reactions   T-Pa [Alteplase] Swelling and Other (See Comments)    Does okay with it if pre-medicated with benadryl; limbs became swollen   Penicillins Other (See Comments)    Unknown    Codeine Itching    Family History  Problem Relation Age of Onset   Other Father        kidney problems    Prior to Admission medications   Medication Sig Start Date End Date Taking? Authorizing Provider  acetaminophen (TYLENOL) 500 MG tablet Take 1,000 mg by mouth every 6 (six) hours as needed for moderate pain or fever.    [provider]  albuterol (VENTOLIN HFA) 108 (90 Base) MCG/ACT inhaler Inhale 2 puffs into the  lungs every 6 (six) hours as needed for wheezing or shortness of breath.    [provider]  atorvastatin (LIPITOR) 20 MG tablet Take 20 mg by mouth at bedtime. 09/15/20   [provider]  benzonatate (TESSALON) 100 MG capsule Take 100 mg by mouth 3 (three) times daily as needed for cough.    [provider]  BIOTIN PO Take 1 capsule by mouth daily.    [provider]  bisacodyl (DULCOLAX) 10 MG suppository Place 10 mg rectally as needed for moderate constipation.    [provider]  Bisacodyl (FLEET LAXATIVE PO) Take 1 Dose by mouth as needed  (constipation).    [provider]  busPIRone (BUSPAR) 30 MG tablet Take 15 mg by mouth 2 (two) times daily. 09/22/20   [provider]  butalbital-acetaminophen-caffeine (FIORICET) 50-325-40 MG tablet Take 0.5 tablets by mouth as needed for headache.    [provider]  cholecalciferol (VITAMIN D3) 25 MCG (1000 UNIT) tablet Take 1,000 Units by mouth daily.    [provider]  clotrimazole-betamethasone (LOTRISONE) cream Apply 1 Application topically 2 (two) times daily. Apply to buttocks    [provider]  cycloSPORINE (RESTASIS) 0.05 % ophthalmic emulsion Place 1 drop into both eyes daily as needed (dry eyes). Patient taking differently: Place 1 drop into both eyes daily as needed (for redness). 02/21/20   Layne Benton, NP  ELIQUIS 5 MG TABS tablet Take 5 mg by mouth in the morning and at bedtime. 09/26/20   [provider]  feeding supplement (ENSURE ENLIVE / ENSURE PLUS) LIQD Take 237 mLs by mouth 3 (three) times daily between meals. Patient taking differently: Take 237 mLs by mouth daily. 05/30/20   Swayze, Ava, DO  ferrous sulfate 325 (65 FE) MG tablet Take 1 tablet (325 mg total) by mouth daily with breakfast. 08/06/20   Lewie Chamber, MD  fluticasone [facility]) 50 MCG/ACT nasal spray Place 2 sprays into both nostrils daily as needed for allergies or rhinitis.    [provider]  furosemide (LASIX) 20 MG tablet Take 10 mg by mouth daily as needed for fluid or edema.    [provider]  gentamicin cream (GARAMYCIN) 0.1 % Apply 1 Application topically See admin instructions. Apply around the feeding tube with every change and every morning with wound dressing change    [provider]  linaclotide (LINZESS) 290 MCG CAPS capsule Take 290 mcg by mouth daily before breakfast.    [provider]  lisinopril (ZESTRIL) 10 MG tablet Take 10 mg by mouth daily.    [provider]  memantine (NAMENDA XR) 28  MG CP24 24 hr capsule Take 1 capsule (28 mg total) by mouth daily. 03/09/20   Angiulli, Mcarthur Rossetti, PA-C  Multiple Vitamins-Minerals (CENTRUM SILVER 50+WOMEN) TABS Take 1 tablet by mouth in the morning.    [provider]  nitroGLYCERIN (NITROSTAT) 0.4 MG SL tablet Place 1 tablet (0.4 mg total) under the tongue every 5 (five) minutes as needed for chest pain. 09/17/19   Duke, Roe Rutherford, PA  nutrition supplement, JUVEN, (JUVEN) PACK Place 1 packet into feeding tube daily.    [provider]  Nutritional Supplements (NUTREN 1.5) LIQD Place 600 mLs into feeding tube at bedtime.    [provider]  Nutritional Supplements (PROSOURCE) LIQD Take 1 Capful by mouth in the morning, at noon, and at bedtime.    [provider]  nystatin powder Apply 1 Application topically See admin  instructions. Apply to buttocks 2 times a day (after applying clotrimazole-betamethasone cream)    [provider]  OVER THE COUNTER MEDICATION 1 patch See admin instructions. Calcium Alginate with Antimicrobial Silver Sheet Dressing- Change dressing every morning (left "tunnel")    [provider]  Oxycodone HCl 20 MG TABS Take 10-20 mg by mouth 4 (four) times daily as needed (pain).    [provider]  potassium chloride (KLOR-CON) 10 MEQ tablet Take 5 mEq by mouth See admin instructions. Take 5 mEq by mouth once a day only when taking Furosemide 09/19/20   [provider]  vitamin C (ASCORBIC ACID) 500 MG tablet Take 500 mg by mouth daily.    [provider]  zinc gluconate 50 MG tablet Take 50 mg by mouth daily.    [provider]  zolpidem (AMBIEN) 5 MG tablet Take 1 tablet (5 mg total) by mouth at bedtime as needed for sleep (Insomnia). Patient not taking: No sig reported 05/30/20 08/05/20  Swayze, Ava, DO     Physical Exam: Vitals:   03/02/23 2045 03/02/23 2100 03/02/23 2130 03/02/23 2159  BP: (!) 176/72 (!) 164/69 (!) 159/70   Pulse:  (!) 56 (!) 57 (!) 56   Resp: 10 20 12    Temp:    98.4 F (36.9 C)  TempSrc:    Oral  SpO2: 100% 100% 99%     Physical Exam   Labs on Admission: I have personally reviewed following labs and imaging studies  CBC: Recent Labs  Lab 03/02/23 1423 03/02/23 1441 03/02/23 1442  WBC 12.0*  --   --   NEUTROABS 10.3*  --   --   HGB 14.8 16.0* 16.0*  HCT 46.8* 47.0* 47.0*  MCV 104.5*  --   --   PLT 91*  --   --    Basic Metabolic Panel: Recent Labs  Lab 03/02/23 1423 03/02/23 1441 03/02/23 1442  NA 138 142 143  K 3.9 3.8 3.8  CL 103 104  --   CO2 23  --   --   GLUCOSE 131* 130*  --   BUN 19 27*  --   CREATININE 0.53 0.40*  --   CALCIUM 8.8*  --   --   MG 2.2  --   --    GFR: CrCl cannot be calculated (Unknown ideal weight.). Liver Function Tests: Recent Labs  Lab 03/02/23 1423  AST 48*  ALT 28  ALKPHOS 61  BILITOT 1.3*  PROT 6.5  ALBUMIN 2.9*   No results for input(s): "LIPASE", "AMYLASE" in the last 168 hours. Recent Labs  Lab 03/02/23 1402  AMMONIA 40*   Coagulation Profile: No results for input(s): "INR", "PROTIME" in the last 168 hours. Cardiac Enzymes: Recent Labs  Lab 03/02/23 1423  TROPONINIHS 9   BNP (last 3 results) No results for input(s): "BNP" in the last 8760 hours. HbA1C: No results for input(s): "HGBA1C" in the last 72 hours. CBG: Recent Labs  Lab 03/02/23 1433  GLUCAP 132*   Lipid Profile: No results for input(s): "CHOL", "HDL", "LDLCALC", "TRIG", "CHOLHDL", "LDLDIRECT" in the last 72 hours. Thyroid Function Tests: No results for input(s): "TSH", "T4TOTAL", "FREET4", "T3FREE", "THYROIDAB" in the last 72 hours. Anemia Panel: No results for input(s): "VITAMINB12", "FOLATE", "FERRITIN", "TIBC", "IRON", "RETICCTPCT" in the last 72 hours. Urine analysis:    Component Value Date/Time   COLORURINE YELLOW 03/02/2023 2145   APPEARANCEUR CLOUDY (A) 03/02/2023 2145   LABSPEC 1.010 03/02/2023 2145   PHURINE  8.0 03/02/2023 2145    GLUCOSEU NEGATIVE 03/02/2023 2145   HGBUR MODERATE (A) 03/02/2023 2145   BILIRUBINUR NEGATIVE 03/02/2023 2145   KETONESUR NEGATIVE 03/02/2023 2145   PROTEINUR 100 (A) 03/02/2023 2145   UROBILINOGEN 1.0 02/14/2012 1054   NITRITE NEGATIVE 03/02/2023 2145   LEUKOCYTESUR LARGE (A) 03/02/2023 2145    Radiological Exams on Admission: I have personally reviewed images DG Chest Portable 1 View  Result Date: 03/02/2023 CLINICAL DATA:  Altered mental status. EXAM: PORTABLE CHEST 1 VIEW COMPARISON:  Chest radiograph and CT 01/26/2023 FINDINGS: The cardiomediastinal silhouette is unchanged with normal heart size. Tortuosity and atherosclerotic calcification are noted of the thoracic aorta. Lung volumes are low with mild streaky opacities in the lung bases favored to reflect atelectasis. The interstitial markings are diffusely increased bilaterally with a slightly nodular appearance in the mid to lower lungs. Curvilinear lucency projecting over the left upper hemithorax likely reflects a skin fold. No definite pneumothorax or sizable pleural effusion is identified. Degenerative changes are noted at the shoulders. IMPRESSION: Low lung volumes with bibasilar atelectasis and diffusely increased interstitial markings which could reflect atypical infection or mild interstitial edema. Electronically Signed   By: Sebastian Ache M.D.   On: 03/02/2023 15:54   CT Head Wo Contrast  Result Date: 03/02/2023 CLINICAL DATA:  Mental status change, unknown cause. EXAM: CT HEAD WITHOUT CONTRAST TECHNIQUE: Contiguous axial images were obtained from the base of the skull through the vertex without intravenous contrast. RADIATION DOSE REDUCTION: This exam was performed according to the departmental dose-optimization program which includes automated exposure control, adjustment of the mA and/or kV according to patient size and/or use of iterative reconstruction technique. COMPARISON:  Head CT 01/27/2023 FINDINGS: Brain: There is no  evidence of an acute infarct, intracranial hemorrhage, mass, midline shift, or extra-axial fluid collection. Mild cerebral atrophy is within normal limits for age. Cerebral white matter hypodensities are unchanged and nonspecific but compatible with mild chronic small vessel ischemic disease. A small chronic infarct in the superior left cerebellar hemisphere is unchanged. Vascular: Calcified atherosclerosis at the skull base. No hyperdense vessel. Skull: No acute fracture or suspicious osseous lesion. Sinuses/Orbits: Visualized paranasal sinuses and mastoid air cells are clear. Bilateral cataract extraction. Other: None. IMPRESSION: 1. No evidence of acute intracranial abnormality. 2. Mild chronic small vessel ischemic disease. Electronically Signed   By: Sebastian Ache M.D.   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  No sonographic evidence of acute cholecystitis.. Noted Dilated CBD Denies abdominal pain, LFTs within normal limits PCP to follow-up   Dementia Bedbound at baseline with contractures Namenda   Evaluation for G tube Per son, they use tube feedings nightly.  Dietitian consulted for orders Son would like G tubing to be evaluated while patient is at the hospital due to ?leaking IR consulted, G-tube was changed on 6/19    Stage II decubitus ulcers, present on admission Followed by  outpatient wound clinic very closely Does not seem to be acutely infected Home health RN for chronic wound/sacral decubitus ulcer management   Hyperlipidemia Lipitor   Paroxysmal A-fib, Hx DVT and PE  Eliquis   Hypertension Lisinopril   GAD BuSpar   DVT prophylaxis:  {Blank single:19197::"Lovenox","SQ Heparin","IV heparin gtts","Xarelto","Eliquis","Coumadin","SCDs","***"} Code Status:  {Blank single:19197::"Full Code","DNR with Intubation","DNR/DNI(Do NOT Intubate)","Comfort Care","***"} Diet:  Family Communication:  ***  Disposition Plan:  ***  Consults:  ***  Admission status:   {Blank single:19197::"Observation","Inpatient"}, {Blank single:19197::"Med-Surg","Telemetry bed","Step Down Unit"}  Severity of Illness: {Observation/Inpatient:21159}    Tereasa Coop, MD [facility]  How to contact the [facility] Attending or Consulting provider 7A - 7P or covering provider during after hours 7P -7A, for this patient.  Check the care team in [facility] and look for a) attending/consulting [facility] provider listed and b) the [facility] team listed Log into [email or web address] and use [place]'s universal password to access. If you do not have the password, please contact the hospital operator. Locate the [facility] provider you are looking for under [facility] and page to a number that you can be directly reached. If you still have difficulty reaching the provider, please page the [facility] (Director on Call) for the Hospitalists listed on amion for assistance.  03/02/2023, 11:31 PM

## 2023-03-02 NOTE — H&P (Incomplete)
History and Physical    [name]:[id number] DOB: 1940-09-16 DOA: 03/02/2023  PCP: [name], MD   Patient coming from: Home   Chief Complaint:  Chief Complaint  Patient presents with  . Altered Mental Status    Possible overdose    HPI:  [name] is a 82 y.o. female with medical history significant of as show hypertension, atrial fibrillation on Eliquis, DVT and PE on Eliquis, prior stroke bedbound, sacral stage II ulcer, generalized anxiety disorder, and tube feeding dependent  all medication by mouth) presented to emergency department due to altered mentation brought in by patient's son.  Patient takes tramadol chronically and patient's son found altered mentation and gave Narcan and with Narcan patient became alert.  Patient's son reported that she is getting weaker day by day and found very sleepy at this morning.  Patient's son reported patient does not have any chest pain, palpitation, fever, chills, cough, abdominal pain, nausea, vomiting diarrhea.  He takes care of of her medications at home.  Reported patient takes all medication orally and no food by mouth.  On tube feeding Januvia 3 times daily and he gives Ensure 3 times daily via G-tube.  Per chart review patient recent hospital admission in June [address]191 for due to acute metabolic encephalopathy to have sepsis from left upper extremity and treated with Rocephin and then transition to doxycycline on discharge.   ED Course:  Initial presentation to ED heart rate 102, respiratory rate 20, blood pressure 175/89 and O2 sat 93% room air.  Lab work, CBC WBC 12, hemoglobin 14 and slightly low platelet count 91. CMP sodium 138, potassium 3.9, bicarb 23, blood glucose 131, BUN 19, creatinine 1.53, calcium 8.8, albumin 2.9, AST 48, elevated bilirubin 1.3 and GFR above 60.  Anion gap 12. Elevated ammonia level 40.  Blood glucose 132 within normal range. VBG pH 7.5, pCO2 38, pO2 111, bicarb 31, calcium 1.1,  hemoglobin 16 and hematocrit 47. High-sensitivity troponin 1 9 and EKG shows [place] tachycardia heart rate 102. UDS unremarkable. UA cloudy appearance, moderate hemoglobin, protein 100, leukocyte large and few bacteria.  Imaging Chest x-ray low lung volumes with bibasilar atelectasis, diffusely increased interstitial markings which reflect atypical infection or mild interstitial edema.  CT head no evidence of acute injury or abnormality and mild chronic small ischemic disease.  With concern for acute metabolic encephalopathy in the setting of UTI patient has been treated with ceftriaxone and received 1 L of normal saline bolus.  Review of Systems:  Review of Systems  Constitutional:  Negative for chills and fever.  Eyes:  Negative for blurred vision.  Respiratory:  Negative for cough and sputum production.   Cardiovascular:  Negative for chest pain, palpitations and leg swelling.  Gastrointestinal:  Negative for abdominal pain, constipation, diarrhea, heartburn, nausea and vomiting.  Genitourinary:  Positive for dysuria. Negative for frequency and urgency.  Musculoskeletal:  Negative for myalgias and neck pain.  Skin:  Negative for rash.  Neurological:  Negative for dizziness and headaches.    Past Medical History:  Diagnosis Date  . Arthritis   . Asthma   . Chronic back pain   . Depression   . History of pulmonary embolus (PE)   . Hx-TIA (transient ischemic attack)   . Hyperlipemia   . Hypertension   . Personal history of DVT (deep vein thrombosis)   . Scoliosis   . Sleep apnea    cpap - settingsi at 2   . Stroke (HCC)   .  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 ARTHROSCOPIC REPAIR ACL  02/03/2004  . HARDWARE REMOVAL N/A 07/27/2020   Procedure: Lumbar Hardware removal with lumbar wound debridement;  Surgeon: Barnett Abu, MD;  Location: [facility] OR;  Service: Neurosurgery;  Laterality: N/A;  . IR GASTROSTOMY TUBE MOD SED   10/30/2020  . IR IVC FILTER PLMT / S&I /IMG GUID/MOD SED  05/10/2020  . IR REPLACE G-TUBE SIMPLE WO FLUORO  01/29/2023  . LEFT HEART CATH AND CORONARY ANGIOGRAPHY N/A 09/16/2019   Procedure: LEFT HEART CATH AND CORONARY ANGIOGRAPHY;  Surgeon: Yvonne Kendall, MD;  Location: [facility] INVASIVE CV LAB;  Service: Cardiovascular;  Laterality: N/A;  . LOOP RECORDER INSERTION N/A 02/21/2020   Procedure: LOOP RECORDER INSERTION;  Surgeon: Duke Salvia, MD;  Location: [facility] INVASIVE CV LAB;  Service: Cardiovascular;  Laterality: N/A;  . LUMBAR WOUND DEBRIDEMENT N/A 07/27/2020   Procedure: LUMBAR WOUND DEBRIDEMENT;  Surgeon: Barnett Abu, MD;  Location: [facility] OR;  Service: Neurosurgery;  Laterality: N/A;  . OOPHORECTOMY  1970  . SPINE SURGERY  03/31/2006  . TOTAL KNEE ARTHROPLASTY Left 05/17/2016   Procedure: LEFT TOTAL KNEE ARTHROPLASTY;  Surgeon: Kathryne Hitch, MD;  Location: [facility] ORS;  Service: Orthopedics;  Laterality: Left;  Adductor Block; Spinal to General  . TUBAL LIGATION  1983     reports that she quit smoking about 49 years ago. Her smoking use included cigarettes. She started smoking about 59 years ago. She has a 10 pack-year smoking history. She has never used smokeless tobacco. She reports that she does not drink alcohol and does not use drugs.  Allergies  Allergen Reactions  . T-Pa [Alteplase] Swelling and Other (See Comments)    Does okay with it if pre-medicated with benadryl; limbs became swollen  . Penicillins Other (See Comments)    Unknown   . Codeine Itching    Family History  Problem Relation Age of Onset  . Other Father        kidney problems    Prior to Admission medications   Medication Sig Start Date End Date Taking? Authorizing Provider  acetaminophen (TYLENOL) 500 MG tablet Take 1,000 mg by mouth every 6 (six) hours as needed for moderate pain or fever.    [provider]  albuterol (VENTOLIN HFA) 108 (90 Base) MCG/ACT inhaler Inhale 2 puffs into the lungs  every 6 (six) hours as needed for wheezing or shortness of breath.    [provider]  atorvastatin (LIPITOR) 20 MG tablet Take 20 mg by mouth at bedtime. 09/15/20   [provider]  benzonatate (TESSALON) 100 MG capsule Take 100 mg by mouth 3 (three) times daily as needed for cough.    [provider]  BIOTIN PO Take 1 capsule by mouth daily.    [provider]  bisacodyl (DULCOLAX) 10 MG suppository Place 10 mg rectally as needed for moderate constipation.    [provider]  Bisacodyl (FLEET LAXATIVE PO) Take 1 Dose by mouth as needed (constipation).    [provider]  busPIRone (BUSPAR) 30 MG tablet Take 15 mg by mouth 2 (two) times daily. 09/22/20   [provider]  butalbital-acetaminophen-caffeine (FIORICET) 50-325-40 MG tablet Take 0.5 tablets by mouth as needed for headache.    [provider]  cholecalciferol (VITAMIN D3) 25 MCG (1000 UNIT) tablet Take 1,000 Units by mouth daily.    [provider]  clotrimazole-betamethasone (LOTRISONE) cream Apply 1 Application topically  2 (two) times daily. Apply to buttocks    [provider]  cycloSPORINE (RESTASIS) 0.05 % ophthalmic emulsion Place 1 drop into both eyes daily as needed (dry eyes). Patient taking differently: Place 1 drop into both eyes daily as needed (for redness). 02/21/20   Layne Benton, NP  ELIQUIS 5 MG TABS tablet Take 5 mg by mouth in the morning and at bedtime. 09/26/20   [provider]  feeding supplement (ENSURE ENLIVE / ENSURE PLUS) LIQD Take 237 mLs by mouth 3 (three) times daily between meals. Patient taking differently: Take 237 mLs by mouth daily. 05/30/20   Swayze, Ava, DO  ferrous sulfate 325 (65 FE) MG tablet Take 1 tablet (325 mg total) by mouth daily with breakfast. 08/06/20   Lewie Chamber, MD  fluticasone [facility]) 50 MCG/ACT nasal spray Place 2 sprays into both nostrils daily as needed for allergies or rhinitis.     [provider]  furosemide (LASIX) 20 MG tablet Take 10 mg by mouth daily as needed for fluid or edema.    [provider]  gentamicin cream (GARAMYCIN) 0.1 % Apply 1 Application topically See admin instructions. Apply around the feeding tube with every change and every morning with wound dressing change    [provider]  linaclotide (LINZESS) 290 MCG CAPS capsule Take 290 mcg by mouth daily before breakfast.    [provider]  lisinopril (ZESTRIL) 10 MG tablet Take 10 mg by mouth daily.    [provider]  memantine (NAMENDA XR) 28 MG CP24 24 hr capsule Take 1 capsule (28 mg total) by mouth daily. 03/09/20   Angiulli, Mcarthur Rossetti, PA-C  Multiple Vitamins-Minerals (CENTRUM SILVER 50+WOMEN) TABS Take 1 tablet by mouth in the morning.    [provider]  nitroGLYCERIN (NITROSTAT) 0.4 MG SL tablet Place 1 tablet (0.4 mg total) under the tongue every 5 (five) minutes as needed for chest pain. 09/17/19   Duke, Roe Rutherford, PA  nutrition supplement, JUVEN, (JUVEN) PACK Place 1 packet into feeding tube daily.    [provider]  Nutritional Supplements (NUTREN 1.5) LIQD Place 600 mLs into feeding tube at bedtime.    [provider]  Nutritional Supplements (PROSOURCE) LIQD Take 1 Capful by mouth in the morning, at noon, and at bedtime.    [provider]  nystatin powder Apply 1 Application topically See admin instructions. Apply to buttocks 2 times a day (after applying clotrimazole-betamethasone cream)    [provider]  OVER THE COUNTER MEDICATION 1 patch See admin instructions. Calcium Alginate with Antimicrobial Silver Sheet Dressing- Change dressing every morning (left "tunnel")    [provider]  Oxycodone HCl 20 MG TABS Take 10-20 mg by mouth 4 (four) times daily as needed (pain).    [provider]  potassium chloride (KLOR-CON) 10 MEQ tablet Take 5 mEq by mouth See admin instructions. Take 5  mEq by mouth once a day only when taking Furosemide 09/19/20   [provider]  vitamin C (ASCORBIC ACID) 500 MG tablet Take 500 mg by mouth daily.    [provider]  zinc gluconate 50 MG tablet Take 50 mg by mouth daily.    [provider]  zolpidem (AMBIEN) 5 MG tablet Take 1 tablet (5 mg total) by mouth at bedtime as needed for sleep (Insomnia). Patient not taking: No sig reported 05/30/20 08/05/20  Fran Lowes, DO     Physical Exam: Vitals:   03/02/23 2045 03/02/23 2100 03/02/23  2130 03/02/23 2159  BP: (!) 176/72 (!) 164/69 (!) 159/70   Pulse: (!) 56 (!) 57 (!) 56   Resp: 10 20 12    Temp:    98.4 F (36.9 C)  TempSrc:    Oral  SpO2: 100% 100% 99%     Physical Exam Constitutional:      General: She is not in acute distress.    Appearance: She is not ill-appearing.     Comments: Demented.  HENT:     Head: Normocephalic and atraumatic.     Nose: Nose normal.  Eyes:     Pupils: Pupils are equal, round, and reactive to light.  Cardiovascular:     Rate and Rhythm: Normal rate and regular rhythm.     Pulses: Normal pulses.     Heart sounds: Normal heart sounds.  Pulmonary:     Effort: Pulmonary effort is normal.     Breath sounds: Normal breath sounds.  Abdominal:     General: Bowel sounds are normal. There is no distension.     Tenderness: There is no abdominal tenderness. There is no guarding or rebound.  Musculoskeletal:        General: No swelling.     Cervical back: Neck supple.     Right lower leg: No edema.     Left lower leg: No edema.  Skin:    General: Skin is warm and dry.     Coloration: Skin is not jaundiced.     Findings: No bruising.  Neurological:     Mental Status: She is alert.     Comments: Patient is alert oriented to place and name.  Psychiatric:        Mood and Affect: Mood normal.      Labs on Admission: I have personally reviewed following labs and imaging studies  CBC: Recent Labs  Lab 03/02/23 1423  03/02/23 1441 03/02/23 1442  WBC 12.0*  --   --   NEUTROABS 10.3*  --   --   HGB 14.8 16.0* 16.0*  HCT 46.8* 47.0* 47.0*  MCV 104.5*  --   --   PLT 91*  --   --    Basic Metabolic Panel: Recent Labs  Lab 03/02/23 1423 03/02/23 1441 03/02/23 1442  NA 138 142 143  K 3.9 3.8 3.8  CL 103 104  --   CO2 23  --   --   GLUCOSE 131* 130*  --   BUN 19 27*  --   CREATININE 0.53 0.40*  --   CALCIUM 8.8*  --   --   MG 2.2  --   --    GFR: CrCl cannot be calculated (Unknown ideal weight.). Liver Function Tests: Recent Labs  Lab 03/02/23 1423  AST 48*  ALT 28  ALKPHOS 61  BILITOT 1.3*  PROT 6.5  ALBUMIN 2.9*   No results for input(s): "LIPASE", "AMYLASE" in the last 168 hours. Recent Labs  Lab 03/02/23 1402  AMMONIA 40*   Coagulation Profile: No results for input(s): "INR", "PROTIME" in the last 168 hours. Cardiac Enzymes: Recent Labs  Lab 03/02/23 1423  TROPONINIHS 9   BNP (last 3 results) No results for input(s): "BNP" in the last 8760 hours. HbA1C: No results for input(s): "HGBA1C" in the last 72 hours. CBG: Recent Labs  Lab 03/02/23 1433  GLUCAP 132*   Lipid Profile: No results for input(s): "CHOL", "HDL", "LDLCALC", "TRIG", "CHOLHDL", "LDLDIRECT" in the last 72 hours. Thyroid Function Tests: No results for input(s): "  TSH", "T4TOTAL", "FREET4", "T3FREE", "THYROIDAB" in the last 72 hours. Anemia Panel: No results for input(s): "VITAMINB12", "FOLATE", "FERRITIN", "TIBC", "IRON", "RETICCTPCT" in the last 72 hours. Urine analysis:    Component Value Date/Time   COLORURINE YELLOW 03/02/2023 2145   APPEARANCEUR CLOUDY (A) 03/02/2023 2145   LABSPEC 1.010 03/02/2023 2145   PHURINE 8.0 03/02/2023 2145   GLUCOSEU NEGATIVE 03/02/2023 2145   HGBUR MODERATE (A) 03/02/2023 2145   BILIRUBINUR NEGATIVE 03/02/2023 2145   KETONESUR NEGATIVE 03/02/2023 2145   PROTEINUR 100 (A) 03/02/2023 2145   UROBILINOGEN 1.0 02/14/2012 1054   NITRITE NEGATIVE 03/02/2023 2145    LEUKOCYTESUR LARGE (A) 03/02/2023 2145    Radiological Exams on Admission: I have personally reviewed images DG Chest Portable 1 View  Result Date: 03/02/2023 CLINICAL DATA:  Altered mental status. EXAM: PORTABLE CHEST 1 VIEW COMPARISON:  Chest radiograph and CT 01/26/2023 FINDINGS: The cardiomediastinal silhouette is unchanged with normal heart size. Tortuosity and atherosclerotic calcification are noted of the thoracic aorta. Lung volumes are low with mild streaky opacities in the lung bases favored to reflect atelectasis. The interstitial markings are diffusely increased bilaterally with a slightly nodular appearance in the mid to lower lungs. Curvilinear lucency projecting over the left upper hemithorax likely reflects a skin fold. No definite pneumothorax or sizable pleural effusion is identified. Degenerative changes are noted at the shoulders. IMPRESSION: Low lung volumes with bibasilar atelectasis and diffusely increased interstitial markings which could reflect atypical infection or mild interstitial edema. Electronically Signed   By: Sebastian Ache M.D.   On: 03/02/2023 15:54   CT Head Wo Contrast  Result Date: 03/02/2023 CLINICAL DATA:  Mental status change, unknown cause. EXAM: CT HEAD WITHOUT CONTRAST TECHNIQUE: Contiguous axial images were obtained from the base of the skull through the vertex without intravenous contrast. RADIATION DOSE REDUCTION: This exam was performed according to the departmental dose-optimization program which includes automated exposure control, adjustment of the mA and/or kV according to patient size and/or use of iterative reconstruction technique. COMPARISON:  Head CT 01/27/2023 FINDINGS: Brain: There is no evidence of an acute infarct, intracranial hemorrhage, mass, midline shift, or extra-axial fluid collection. Mild cerebral atrophy is within normal limits for age. Cerebral white matter hypodensities are unchanged and nonspecific but compatible with mild chronic  small vessel ischemic disease. A small chronic infarct in the superior left cerebellar hemisphere is unchanged. Vascular: Calcified atherosclerosis at the skull base. No hyperdense vessel. Skull: No acute fracture or suspicious osseous lesion. Sinuses/Orbits: Visualized paranasal sinuses and mastoid air cells are clear. Bilateral cataract extraction. Other: None. IMPRESSION: 1. No evidence of acute intracranial abnormality. 2. Mild chronic small vessel ischemic disease. Electronically Signed   By: Sebastian Ache M.D.   On: 03/02/2023 15:50    EKG: My personal interpretation of EKG shows: Sinus tachycardia.  No ST and T wave abnormality.    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  Patient takes tramadol 3 times daily at home and as patient found very sleepy his son gave him Narcan and  patient wake up with that.  However as patient found confused, and from last hospital discharge patient's mentation and health has been progressively deteriorating son brought her to the ED for evaluation. - Patient is afebrile.  Borderline elevated blood pressure.  CBC showed WBC 14.  UA showed moderate bacteria and leukocyte esterase positive.  Concern for acute metabolic encephalopathy in the setting of UTI - Checking urine micro and culture. - At ED patient treated with ceftriaxone 1 g once and 1 L of LR bolus. - Continues ceftriaxone 1 g daily. -Continue gentle hydration D5 LR 100 cc/h for next 10 hours. - Will follow-up with  urine culture for antibiotic guidance. -Checking morning CBC and CMP  Acute metabolic encephalopathy-improving -As mentioned above at home patient found altered mentation and very sleepy.  Patient takes tramadol 3 times daily at home and as patient found very sleepy his son gave him Narcan and patient wake up with that.  However as patient found confused, and from last hospital discharge patient's mentation and health has been progressively deteriorating son brought her to the ED for evaluation. -Head CT rule out any acute intracranial abnormality and stroke. -Acute metabolic encephalopathy in the setting of acute cystitis - Continue to monitor mentation.  Hoping that patient's mentation will improve completely with antibiotic and IV fluid treatment. - Continue fall precaution, aspiration precaution  Cholelithiasis Transaminitis Hyperbilirubinemia - Per chart review previous ultrasound showed cholelithiasis.  Dilated CBD - On this presentation patient denies any abdominal pain.   -Slight elevated bilirubin 1.3 and AST 48 -Hyperbilirubinemia and transaminitis possibly secondary from underlying tube feeding. - Continue to monitor liver function   Advanced age with cognitive decline  Dementia with cognitive decline      Evaluation for G tube Per son, they use tube feedings nightly.  Dietitian consulted for orders Son would like G tubing to be evaluated while patient is at the hospital due to ?leaking IR consulted, G-tube was changed on 6/19    Stage II decubitus ulcers, present on admission Followed by outpatient wound clinic very closely Does not seem to be acutely infected Home health RN for chronic wound/sacral decubitus ulcer management   Hyperlipidemia Lipitor   Paroxysmal A-fib, Hx DVT and PE  Eliquis   Hypertension Lisinopril   GAD BuSpar   DVT prophylaxis:  {Blank single:19197::"Lovenox","SQ Heparin","IV heparin  gtts","Xarelto","Eliquis","Coumadin","SCDs","***"} Code Status:  {Blank single:19197::"Full Code","DNR with Intubation","DNR/DNI(Do NOT Intubate)","Comfort Care","***"} Diet:  Family Communication:  ***  Disposition Plan:  ***  Consults:  ***  Admission status:   {Blank single:19197::"Observation","Inpatient"}, {Blank single:19197::"Med-Surg","Telemetry bed","Step Down Unit"}  Severity of Illness: {Observation/Inpatient:21159}    Tereasa Coop, MD [facility]  How to contact the [facility] Attending or Consulting provider 7A - 7P or covering provider during after hours 7P -7A, for this patient.  Check the care team in [facility] and look for a) attending/consulting [facility] provider listed and b) the [facility] team listed Log into [email or web address] and use [place]'s universal password to access. If you do not have the password, please contact the hospital operator. Locate the [facility] provider you are looking for under [facility] and page to a number that you can be directly reached. If you still have difficulty reaching the provider, please page the [facility] (Director on Call) for the Hospitalists listed on amion for assistance.  03/03/2023, 12:01 AM

## 2023-03-02 NOTE — ED Provider Notes (Signed)
  Physical Exam  BP (!) 159/70   Pulse (!) 56   Temp 98.4 F (36.9 C) (Oral)   Resp 12   SpO2 99%   Physical Exam Vitals and nursing note reviewed.  Constitutional:      General: She is not in acute distress.    Appearance: She is well-developed.  HENT:     Head: Normocephalic and atraumatic.  Eyes:     Conjunctiva/sclera: Conjunctivae normal.  Cardiovascular:     Rate and Rhythm: Normal rate and regular rhythm.     Heart sounds: No murmur heard. Pulmonary:     Effort: Pulmonary effort is normal. No respiratory distress.     Breath sounds: Normal breath sounds.  Abdominal:     Palpations: Abdomen is soft.     Tenderness: There is no abdominal tenderness.  Musculoskeletal:        General: No swelling.     Cervical back: Neck supple.  Skin:    General: Skin is warm and dry.     Capillary Refill: Capillary refill takes less than 2 seconds.     Findings: Lesion present.  Neurological:     Mental Status: She is alert.  Psychiatric:        Mood and Affect: Mood normal.     Procedures  Procedures  ED Course / MDM   Clinical Course as of 03/02/23 2303  Sun Mar 02, 2023  1449 Glucose-Capillary(!): 132 unremarkable [HN]  1652 I-stat chem 8, ED (not at [facility], [facility] or [facility])(!) [MK]    Clinical Course User Index [HN] Loetta Rough, MD [MK] Zayden Hahne, MD   Medical Decision Making Amount and/or Complexity of Data Reviewed Labs: ordered. Decision-making details documented in ED Course. Radiology: ordered.  Risk Decision regarding hospitalization.   Patient received in handoff.  Altered mental status with possible overdose this morning.  Patient reportedly getting weaker over the last few days and was unresponsive this morning.  Did have significant change in her mental status after Narcan administration by patient's son but remains altered and weak here in the emergency permit.  Pending urinalysis and completion of laboratory evaluation at time of signout.   Urinalysis concerning for urinary tract infection.  I did examine the patient's sacral wounds which do not appear infected today.  Patient will call hospital admission for altered mental status UTI.  Patient admitted.       Glendora Score, MD 03/02/23 [id number]

## 2023-03-03 ENCOUNTER — Inpatient Hospital Stay (HOSPITAL_COMMUNITY): Payer: Medicare Other

## 2023-03-03 DIAGNOSIS — R4182 Altered mental status, unspecified: Secondary | ICD-10-CM | POA: Diagnosis not present

## 2023-03-03 DIAGNOSIS — G9341 Metabolic encephalopathy: Secondary | ICD-10-CM | POA: Diagnosis not present

## 2023-03-03 DIAGNOSIS — N3001 Acute cystitis with hematuria: Secondary | ICD-10-CM

## 2023-03-03 DIAGNOSIS — I482 Chronic atrial fibrillation, unspecified: Secondary | ICD-10-CM | POA: Diagnosis not present

## 2023-03-03 LAB — COMPREHENSIVE METABOLIC PANEL
ALT: 23 U/L (ref 0–44)
AST: 35 U/L (ref 15–41)
Albumin: 2.9 g/dL — ABNORMAL LOW (ref 3.5–5.0)
Alkaline Phosphatase: 63 U/L (ref 38–126)
Anion gap: 13 (ref 5–15)
BUN: 11 mg/dL (ref 8–23)
CO2: 22 mmol/L (ref 22–32)
Calcium: 9.1 mg/dL (ref 8.9–10.3)
Chloride: 104 mmol/L (ref 98–111)
Creatinine, Ser: 0.46 mg/dL (ref 0.44–1.00)
GFR, Estimated: >60 mL/min (ref >60)
Glucose, Bld: 107 mg/dL — ABNORMAL HIGH (ref 70–99)
Potassium: 3.9 mmol/L (ref 3.5–5.1)
Sodium: 139 mmol/L (ref 135–145)
Total Bilirubin: 0.9 mg/dL (ref 0.3–1.2)
Total Protein: 6.8 g/dL (ref 6.5–8.1)

## 2023-03-03 LAB — CBC
HCT: 48.9 % — ABNORMAL HIGH (ref 36.0–46.0)
Hemoglobin: 15.1 g/dL — ABNORMAL HIGH (ref 12.0–15.0)
MCH: 32.5 pg (ref 26.0–34.0)
MCHC: 30.9 g/dL (ref 30.0–36.0)
MCV: 105.4 fL — ABNORMAL HIGH (ref 80.0–100.0)
Platelets: 85 10*3/uL — ABNORMAL LOW (ref 150–400)
RBC: 4.64 MIL/uL (ref 3.87–5.11)
RDW: 13.4 % (ref 11.5–15.5)
WBC: 11.9 10*3/uL — ABNORMAL HIGH (ref 4.0–10.5)
nRBC: 0 % (ref 0.0–0.2)

## 2023-03-03 LAB — GLUCOSE, CAPILLARY
Glucose-Capillary: 146 mg/dL — ABNORMAL HIGH (ref 70–99)
Glucose-Capillary: 107 mg/dL — ABNORMAL HIGH (ref 70–99)
Glucose-Capillary: 165 mg/dL — ABNORMAL HIGH (ref 70–99)

## 2023-03-03 LAB — URINE CULTURE

## 2023-03-03 LAB — AMMONIA: Ammonia: 32 umol/L (ref 9–35)

## 2023-03-03 LAB — TROPONIN I (HIGH SENSITIVITY): Troponin I (High Sensitivity): 14 ng/L (ref <18)

## 2023-03-03 MED ORDER — LISINOPRIL 20 MG PO TABS
20.0000 mg | ORAL_TABLET | Freq: Every day | ORAL | Status: DC
  Administered 2023-03-04 – 2023-03-07 (×4): 20 mg via ORAL
  Filled 2023-03-03 (×4): qty 1

## 2023-03-03 MED ORDER — OSMOLITE 1.2 CAL PO LIQD
1000.0000 mL | ORAL | Status: DC
  Administered 2023-03-03 – 2023-03-07 (×3): 1000 mL
  Filled 2023-03-03 (×7): qty 1000

## 2023-03-03 MED ORDER — BUTALBITAL-APAP-CAFFEINE 50-325-40 MG PO TABS
1.0000 | ORAL_TABLET | Freq: Four times a day (QID) | ORAL | Status: DC | PRN
  Administered 2023-03-04 (×2): 1 via ORAL
  Filled 2023-03-03 (×2): qty 1

## 2023-03-03 MED ORDER — FREE WATER
100.0000 mL | Status: DC
  Administered 2023-03-03 – 2023-03-07 (×25): 100 mL

## 2023-03-03 MED ORDER — ZINC OXIDE 40 % EX OINT
TOPICAL_OINTMENT | Freq: Two times a day (BID) | CUTANEOUS | Status: DC
  Filled 2023-03-03: qty 57

## 2023-03-03 MED ORDER — OSMOLITE 1.5 CAL PO LIQD
1000.0000 mL | ORAL | Status: DC
  Filled 2023-03-03 (×2): qty 1000

## 2023-03-03 MED ORDER — HYDRALAZINE HCL 20 MG/ML IJ SOLN
10.0000 mg | Freq: Four times a day (QID) | INTRAMUSCULAR | Status: DC | PRN
  Administered 2023-03-03 – 2023-03-04 (×3): 10 mg via INTRAVENOUS
  Filled 2023-03-03 (×3): qty 1

## 2023-03-03 MED ORDER — AMLODIPINE BESYLATE 5 MG PO TABS
5.0000 mg | ORAL_TABLET | Freq: Every day | ORAL | Status: DC
  Administered 2023-03-03 – 2023-03-07 (×5): 5 mg via ORAL
  Filled 2023-03-03 (×5): qty 1

## 2023-03-03 MED ORDER — LISINOPRIL 10 MG PO TABS
10.0000 mg | ORAL_TABLET | Freq: Once | ORAL | Status: AC
  Administered 2023-03-03: 10 mg via ORAL
  Filled 2023-03-03: qty 1

## 2023-03-03 MED ORDER — JUVEN PO PACK
1.0000 | PACK | Freq: Two times a day (BID) | ORAL | Status: DC
  Administered 2023-03-03 – 2023-03-07 (×9): 1
  Filled 2023-03-03 (×8): qty 1

## 2023-03-03 MED ORDER — METOCLOPRAMIDE HCL 5 MG/ML IJ SOLN
5.0000 mg | Freq: Three times a day (TID) | INTRAMUSCULAR | Status: DC
  Administered 2023-03-03 – 2023-03-05 (×6): 5 mg via INTRAVENOUS
  Filled 2023-03-03 (×6): qty 2

## 2023-03-03 MED ORDER — NITROGLYCERIN 2 % TD OINT
1.0000 [in_us] | TOPICAL_OINTMENT | Freq: Once | TRANSDERMAL | Status: AC
  Administered 2023-03-03: 1 [in_us] via TOPICAL
  Filled 2023-03-03 (×2): qty 30

## 2023-03-03 MED ORDER — PROSOURCE TF20 ENFIT COMPATIBL EN LIQD
60.0000 mL | Freq: Two times a day (BID) | ENTERAL | Status: DC
  Administered 2023-03-03 – 2023-03-07 (×9): 60 mL
  Filled 2023-03-03 (×9): qty 60

## 2023-03-03 MED ORDER — PROCHLORPERAZINE EDISYLATE 10 MG/2ML IJ SOLN
10.0000 mg | INTRAMUSCULAR | Status: DC | PRN
  Administered 2023-03-03 (×2): 10 mg via INTRAVENOUS
  Filled 2023-03-03 (×2): qty 2

## 2023-03-03 NOTE — Progress Notes (Signed)
Transition of Care [facility]) - Inpatient Brief Assessment   Patient Details  Name: [name] MRN: [id number] Date of Birth: Dec 22, 1940  Transition of Care [facility]) CM/SW Contact:    Janae Bridgeman, RN Phone Number: 03/03/2023, 10:58 AM   Clinical Narrative: CM met with the patient at the bedside and the patient was unable to participate in goals of her care. I called the patient's son, [name] by phone and patient was admitted for Acute Encephalopathy.  The patient lives at home with the son who provide 24 hour care at the home.  [facility] is involved in the patient's care for RN.  Home health aide and MSW will be helpful at the home since the son provides 24 hour care will little room for Respite.    I called and spoke with Morrie Sheldon, CM at [facility] and she is aware of patient's admission to the hospital.  RN, aide and MSW to be added for the home health orders to be co-signed by the MD.  DME at the home includes hospital bed, air mattress and tube feeding pump and supplies.  PCP - patient's PCP provides telehealth visits at the home - through Dr. Westly Pam office.  Patient will need PTAR home when medically stable to discharge.   Transition of Care Asessment: Insurance and Status: (P) Insurance coverage has been reviewed Patient has primary care physician: (P) Yes Home environment has been reviewed: (P) Patient at home with the son, Malachi Pro who provides 24 care at the home Prior level of function:: (P) Bedbound at home Prior/Current Home Services: (P) Current home services (Active with [facility], [facility] for DME) Social Determinants of Health Reivew: (P) SDOH reviewed interventions complete Readmission risk has been reviewed: (P) Yes Transition of care needs: (P) transition of care needs identified, TOC will continue to follow

## 2023-03-03 NOTE — Consult Note (Addendum)
WOC Nurse Consult Note: Reason for Consult: Consult requested for sacrum, bilat buttocks, and G-tube. Performed remotely after review of progress notes and photos in the EMR.  G-tube with red moist macerated skin surrounding the insertion site. Topical treatment will be minimally effective to promote healing if there is leaking around the tube. Sacrum with Stage 2 pressure injury, red and moist Bilat buttocks with Stage 2 pressure injuries, red and moist Bilat lower buttocks skin creases with red moist partial thickness wounds; appearance and location is consistent with moisture and shear.  Pressure Injury POA: Yes Dressing procedure/placement/frequency: Topical treatment orders provided for bedside nurses to perform as follows:  1. Apply foam dressings to sacrum/bilat buttocks/bilat lower buttocks creases. Change Q 3 days or PRN soiling 2. Apply Desitin around G tube site BID and PRN if leaking, then cover with split thickness gauze. Please re-consult if further assistance is needed.  Thank-you,  Cammie Mcgee MSN, RN, CWOCN, [place], CNS [phone]

## 2023-03-03 NOTE — Progress Notes (Signed)
Initial Nutrition Assessment  DOCUMENTATION CODES:   Not applicable  INTERVENTION:  [facility] is currently out of Osm 1.5 so RD will substitute the following until other formula is back in stock.  - Initiate Osmolite 1.2 @ 20 mL/hr and advance by 10 mL Q6H to goal rate of 70 mL/hr (1680 mL) +PS BID.  - This will provide 2176 kcals, 133 gm protein, and 1380 mL free water.  -FWF 100 mL Q4H. - this will provide a total of 1980 mL total with TF.   - Add -1 packet Juven BID, each packet provides 95 calories, 2.5 grams of protein (collagen), and 9.8 grams of carbohydrate (3 grams sugar); also contains 7 grams of L-arginine and L-glutamine, 300 mg vitamin C, 15 mg vitamin E, 1.2 mcg vitamin B-12, 9.5 mg zinc, 200 mg calcium, and 1.5 g  Calcium Beta-hydroxy-Beta-methylbutyrate to support wound healing  Once formula is restocked:  Initiate Osmolite 1.5 @ goal rate of 55 mL/hr (1320 mL) +PS BID  - This will provide 2140 kcals, 123 gm protein, 1008 mL free water.   NUTRITION DIAGNOSIS:   Increased nutrient needs related to wound healing as evidenced by estimated needs.  GOAL:   Patient will meet greater than or equal to 90% of their needs  MONITOR:   TF tolerance  REASON FOR ASSESSMENT:   Consult Enteral/tube feeding initiation and management  ASSESSMENT:   82 y.o. female admits related to AMS. PMH includes: arthritis, asthma, TIA, HLD, HTN, stroke. Pt is currently receiving medical management related to acute cystitis.  Meds reviewed: lipitor, MVI. Labs reviewed: BUN/Creatinine high.   RD working remotely. Spoke with RN who reports that no family was at bedside. Pt with PEG tube and was previously on Osmolite 1.5 on most recent admission. [facility] is currently out of Osmolite 1.5. RD will modify orders to Osmolite 1.2 for now and continue to monitor TF tolerance. Pt with increased nutrient needs related to wound healing. RD will add Juven BID. Will continue to monitor TF tolerance.    NUTRITION - FOCUSED PHYSICAL EXAM:  Remote assessment.  Diet Order:   Diet Order             Diet NPO time specified Except for: Sips with Meds  Diet effective now                   EDUCATION NEEDS:   Not appropriate for education at this time  Skin:  Skin Assessment: Skin Integrity Issues: Skin Integrity Issues:: Stage II Stage II: Decubitus ulcer  Last BM:  PTA  Height:   Ht Readings from Last 1 Encounters:  03/03/23 5\' 5"  (1.651 m)    Weight:   Wt Readings from Last 1 Encounters:  01/28/23 82.7 kg    Ideal Body Weight:     BMI:  Body mass index is 30.34 kg/m.  Estimated Nutritional Needs:   Kcal:  2000-2400 kcals  Protein:  100-120 gm  Fluid:  >/= 2 L  Bethann Humble, RD, LDN, CNSC.

## 2023-03-03 NOTE — Plan of Care (Signed)
  Problem: Education: Goal: Knowledge of General Education information will improve Description Including pain rating scale, medication(s)/side effects and non-pharmacologic comfort measures Outcome: Progressing   

## 2023-03-03 NOTE — Progress Notes (Signed)
[facility]                                                                              [name], is a 82 y.o. female, DOB - 1941/07/16, XBM:[id number] Admit date - 03/02/2023    Outpatient Primary MD for the patient is [name], MD  LOS - 1  days  Chief Complaint  Patient presents with   Altered Mental Status    Possible overdose       Brief summary   Patient is a 82 year old female with HTN, A-fib on Eliquis, DVT and PE on Eliquis, prior stroke, bedbound, sacral stage II ulcer, anxiety, dysphagia, tube feed dependent presented to ED from home due to altered mental status.  Patient takes tramadol chronically and her son found her confused.  Patient's son called EMS stating that she was unresponsive, had given a dose of narcan and patient became more alert.  Son also reported that she has been getting weaker over the last few days.   In ED, patient was still found to be confused, weak.  Temp 100.3 F, pulse 102, BP 175/89, O2 sats 93% on room air.   UA positive for UTI Patient was admitted for further workup.  Assessment & Plan    Principal Problem:   Acute metabolic encephalopathy -  likely due to UTI, medication effect, progressive deterioration -At home, takes tramadol 3 times daily and was found to be lethargic and sleepy by son.  Son gave her Narcan and patient did become more alert.  However reported that since her previous hospital discharge (admitted 6/16-6/20/2024 for sepsis and cellulitis), her mentation and health has been progressively deteriorating. -Currently fairly alert and oriented this morning, appears close to her baseline, continue IV antibiotics, follow cultures and sensitivities -Ammonia level 40, CT head showed no acute intracranial abnormalities, chronic small vessel changes.  - Chest x-ray showed bibasilar atelectasis and diffusely increased interstitial marking could reflect atypical infection or mild interstitial  edema   Active Problems: Acute urinary tract infection -Follow urine culture and sensitivities, continue IV Rocephin    History of CVA (cerebrovascular accident), bedbound, dysphagia -Continue apixaban, Lipitor -Continue tube feeds, PT OT as tolerated -Dietitian consulted for tube feeding orders and free water    Hyperlipidemia -Continue Lipitor  Hypertensive urgency -BP readings elevated, increase lisinopril to 20 mg daily, added Norvasc 5 mg daily, -Placed on IV hydralazine as needed with parameters.     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 (Active)  03/03/23   Location: Sacrum  Location Orientation: Mid  Staging: Stage 2 -  Partial thickness loss of dermis presenting as a shallow open injury with a red, pink wound bed without slough.  Wound Description (Comments):   Present on Admission: Yes     Pressure Injury Buttocks Right;Left Stage 2 -  Partial thickness loss of dermis presenting as a shallow open injury with a red, pink wound bed without slough. (Active)     Location: Buttocks  Location Orientation: Right;Left  Staging: Stage 2 -  Partial thickness loss of dermis presenting as a shallow open injury with a red, pink wound bed without slough.  Wound Description (Comments):   Present on Admission: Yes  - Wound care  consult  Obesity Estimated body mass index is 30.34 kg/m as calculated from the following:   Height as of this encounter: 5\' 5"  (1.651 m).   Weight as of 01/28/23: 82.7 kg.  Code Status: DNR DVT Prophylaxis:  SCDs Start: 03/02/23 2345 apixaban (ELIQUIS) tablet 5 mg   Level of Care: Level of care: Telemetry Medical Family Communication: Updated patient, no family at the bedside Disposition Plan:      Remains inpatient appropriate: Workup in progress   Procedures:  None  Consultants:   None  Antimicrobials:   Anti-infectives (From admission, onward)    Start     Dose/Rate Route Frequency Ordered Stop   03/03/23 2200  cefTRIAXone (ROCEPHIN) 1 g in sodium chloride 0.9 % 100 mL IVPB        1 g 200 mL/hr over 30 Minutes Intravenous Every 24 hours 03/02/23 2346 03/09/23 2159   03/03/23 0000  cefTRIAXone (ROCEPHIN) 1 g in sodium chloride 0.9 % 100 mL IVPB  Status:  Discontinued        1 g 200 mL/hr over 30 Minutes Intravenous Every 24 hours 03/02/23 2345 03/02/23 2346   03/02/23 2300  cefTRIAXone (ROCEPHIN) 2 g in sodium chloride 0.9 % 100 mL IVPB        2 g 200 mL/hr over 30 Minutes Intravenous  Once 03/02/23 2258 03/03/23 0058          Medications  apixaban  5 mg Oral BID   atorvastatin  20 mg Oral QHS   busPIRone  15 mg Oral BID   clotrimazole-betamethasone  1 Application Topical BID   feeding supplement (PROSource TF20)  60 mL Per Tube BID   free water  100 mL Per Tube Q4H   lisinopril  10 mg Oral Daily   liver oil-zinc oxide   Topical BID   memantine  28 mg Oral Daily   multivitamin  1 tablet Oral Daily   nitroGLYCERIN  1 inch Topical Once   nutrition supplement (JUVEN)  1 packet Per Tube BID BM   nystatin  1 Application Topical BID   sodium chloride flush  3 mL Intravenous Q12H      Subjective:   [name] was seen and examined today.  BP elevated.  Much more alert and oriented today, knows she is in the hospital, month and year.  C/o nausea  otherwise no acute fevers, abdominal pain, diarrhea.   Objective:   Vitals:   03/03/23 0158 03/03/23 0333 03/03/23 0431 03/03/23 0814  BP: (!) 204/79 (!) 180/68 (!) 183/73 (!) 185/70  Pulse: (!) 53 62 61 61  Resp: 16 16 15 16   Temp: 97.8 F (36.6 C) 97.8 F (36.6 C) 98 F (36.7 C) 98.2 F (36.8 C)  TempSrc: Oral Oral  Oral  SpO2: 100%   99%  Height:  5\' 5"  (1.651 m)      Intake/Output Summary (Last 24 hours) at 03/03/2023 0915 Last data filed at 03/03/2023 1610 Gross per 24 hour  Intake 1145.86 ml  Output --  Net 1145.86 ml     Wt Readings from Last 3 Encounters:  01/28/23 82.7 kg  02/04/22 62 kg  10/19/20 61.3 kg     Exam General: Alert and oriented x 3, NAD Cardiovascular: S1 S2 auscultated,  RRR Respiratory: CTAB Gastrointestinal: Soft, nontender, nondistended, + bowel sounds, PEG tube Ext: no pedal edema bilaterally Neuro: unable to lift her lower extremities, strength in UE 4/5 with contractures Skin: Chronic decub stage II sacral ulcer Psych: Normal affect     Data Reviewed:  I have personally reviewed following labs    CBC Lab Results  Component Value Date   WBC 12.0 (H) 03/02/2023   RBC 4.48 03/02/2023   HGB 16.0 (H) 03/02/2023   HCT 47.0 (H) 03/02/2023   MCV 104.5 (H) 03/02/2023   MCH 33.0 03/02/2023   PLT 91 (L) 03/02/2023   MCHC 31.6 03/02/2023   RDW 13.5 03/02/2023   LYMPHSABS 1.0 03/02/2023   MONOABS 0.6 03/02/2023   EOSABS 0.1 03/02/2023   BASOSABS 0.0 03/02/2023     Last metabolic panel Lab Results  Component Value Date   NA 143 03/02/2023   K 3.8 03/02/2023   CL 104 03/02/2023   CO2 23 03/02/2023   BUN 27 (H) 03/02/2023   CREATININE 0.40 (L) 03/02/2023   GLUCOSE 130 (H) 03/02/2023   GFRNONAA >60 03/02/2023   GFRAA >60 05/14/2020   CALCIUM 8.8 (L) 03/02/2023   PHOS 3.0 11/04/2020   PROT 6.5 03/02/2023   ALBUMIN 2.9 (L) 03/02/2023   BILITOT 1.3 (H) 03/02/2023   ALKPHOS 61 03/02/2023   AST 48 (H) 03/02/2023   ALT 28  03/02/2023   ANIONGAP 12 03/02/2023    CBG (last 3)  Recent Labs    03/02/23 1433  GLUCAP 132*      Coagulation Profile: No results for input(s): "INR", "PROTIME" in the last 168 hours.   Radiology Studies: I have personally reviewed the imaging studies  DG Chest Portable 1 View  Result Date: 03/02/2023 CLINICAL DATA:  Altered mental status. EXAM: PORTABLE CHEST 1 VIEW COMPARISON:  Chest radiograph and CT 01/26/2023 FINDINGS: The cardiomediastinal silhouette is unchanged with normal heart size. Tortuosity and atherosclerotic calcification are noted of the thoracic aorta. Lung volumes are low with mild streaky opacities in the lung bases favored to reflect atelectasis. The interstitial markings are diffusely increased bilaterally with a slightly nodular appearance in the mid to lower lungs. Curvilinear lucency projecting over the left upper hemithorax likely reflects a skin fold. No definite pneumothorax or sizable pleural effusion is identified. Degenerative changes are noted at the shoulders. IMPRESSION: Low lung volumes with bibasilar atelectasis and diffusely increased interstitial markings which could reflect atypical infection or mild interstitial edema. Electronically Signed   By: Sebastian Ache M.D.   On: 03/02/2023 15:54   CT Head Wo Contrast  Result Date: 03/02/2023 CLINICAL DATA:  Mental status change, unknown cause. EXAM: CT HEAD WITHOUT CONTRAST TECHNIQUE: Contiguous axial images were obtained from the base of the skull through the vertex without intravenous contrast. RADIATION DOSE REDUCTION: This exam was performed according to the departmental dose-optimization program which includes automated exposure control, adjustment of the mA and/or kV according to patient size and/or use of iterative reconstruction technique. COMPARISON:  Head CT 01/27/2023 FINDINGS: Brain: There is no evidence of  an acute infarct, intracranial hemorrhage, mass, midline shift, or extra-axial fluid  collection. Mild cerebral atrophy is within normal limits for age. Cerebral white matter hypodensities are unchanged and nonspecific but compatible with mild chronic small vessel ischemic disease. A small chronic infarct in the superior left cerebellar hemisphere is unchanged. Vascular: Calcified atherosclerosis at the skull base. No hyperdense vessel. Skull: No acute fracture or suspicious osseous lesion. Sinuses/Orbits: Visualized paranasal sinuses and mastoid air cells are clear. Bilateral cataract extraction. Other: None. IMPRESSION: 1. No evidence of acute intracranial abnormality. 2. Mild chronic small vessel ischemic disease. Electronically Signed   By: Sebastian Ache M.D.   On: 03/02/2023 15:50       Omarius Grantham M.D. [facility] 03/03/2023, 9:15 AM  Available via Epic secure chat 7am-7pm After 7 pm, please refer to night coverage provider listed on amion.

## 2023-03-04 DIAGNOSIS — I10 Essential (primary) hypertension: Secondary | ICD-10-CM | POA: Diagnosis not present

## 2023-03-04 DIAGNOSIS — G9341 Metabolic encephalopathy: Secondary | ICD-10-CM | POA: Diagnosis not present

## 2023-03-04 DIAGNOSIS — N3000 Acute cystitis without hematuria: Secondary | ICD-10-CM | POA: Diagnosis not present

## 2023-03-04 DIAGNOSIS — Z789 Other specified health status: Secondary | ICD-10-CM | POA: Diagnosis not present

## 2023-03-04 LAB — GLUCOSE, CAPILLARY
Glucose-Capillary: 128 mg/dL — ABNORMAL HIGH (ref 70–99)
Glucose-Capillary: 137 mg/dL — ABNORMAL HIGH (ref 70–99)
Glucose-Capillary: 147 mg/dL — ABNORMAL HIGH (ref 70–99)
Glucose-Capillary: 148 mg/dL — ABNORMAL HIGH (ref 70–99)
Glucose-Capillary: 152 mg/dL — ABNORMAL HIGH (ref 70–99)
Glucose-Capillary: 161 mg/dL — ABNORMAL HIGH (ref 70–99)
Glucose-Capillary: 164 mg/dL — ABNORMAL HIGH (ref 70–99)

## 2023-03-04 LAB — URINE CULTURE: Culture: >=100000 — AB

## 2023-03-04 NOTE — Progress Notes (Signed)
OT Cancellation Note  Patient Details Name: MALYIA MORO MRN: [id number] DOB: [birth year]   Cancelled Treatment:    Reason Eval/Treat Not Completed: OT screened, no needs identified, will sign off Pt bedbound and receives 24/7 care from family for ADLs at baseline. Talked to son via phone who confirmed PLOF, denies any DME needs or functional decline at this time. No formal OT eval needed.  Lorre Munroe 03/04/2023, 12:49 PM

## 2023-03-04 NOTE — Progress Notes (Signed)
[facility]                                                                               [name], is a 82 y.o. female, DOB - [birth year], UJW:[id number] Admit date - 03/02/2023    Outpatient Primary MD for the patient is [name], MD  LOS - 2  days    Brief summary   82 year old female with HTN, A-fib on Eliquis, DVT and PE on Eliquis, prior stroke, bedbound, sacral stage II ulcer, anxiety, dysphagia, tube feed dependent presented to ED from home due to altered mental status.  Patient takes tramadol chronically and her son found her confused.  Patient's son called EMS stating that she was unresponsive, had given a dose of narcan and patient became more alert.  Son also reported that she has been getting weaker over the last few days.  Work up is positive for UTI. She was started on IV antibiotics and urine cultures are pending.    Assessment & Plan    Assessment and Plan:   Acute metabolic encephalopathy probably secondary to a combination of urinary tract infection and tramadol and progressive deterioration. Patient on tramadol 3 times a day, became lethargic was given a dose of Narcan on admission and patient's mental status improved. CT of the head without contrast does not show any acute intracranial abnormalities.   Urinary tract infection Urine cultures grew 100,000 gram-negative rods. Continue with IV ceftriaxone.    History of CVA;  Continue with Eliquis and Lipitor. Patient is on tube feeds   Hypertensive Urgency:  BP parameters are much better this morning.  Continue with lisinopril 20 mg daily and Norvasc 5 mg daily.  PRN hydralazine added    Dementia without behavioral disturbance, anxiety Continue with BuSpar and memantine. Continue medicine consult for goals of care given progressive deterioration in her health.   Chronic atrial fibrillation Continue with Eliquis for anticoagulation Rate well-controlled.   History of  pulmonary embolism and DVT Continue with Eliquis   Hyperammonemia Appears to have resolved   RN Pressure Injury Documentation: Pressure Injury 10/19/20 Vertebral column Left;Mid Unstageable - Full thickness tissue loss in which the base of the injury is covered by slough (yellow, tan, gray, green or brown) and/or eschar (tan, brown or black) in the wound bed. eschar d/t removal  (Active)  10/19/20 2020  Location: Vertebral column  Location Orientation: Left;Mid  Staging: Unstageable - Full thickness tissue loss in which the base of the injury is covered by slough (yellow, tan, gray, green or brown) and/or eschar (tan, brown or black) in the wound bed.  Wound Description (Comments): eschar d/t removal of medical equipment which became infected  Present on Admission: Yes     Pressure Injury 10/19/20 Buttocks Left Stage 4 - Full thickness tissue loss with exposed bone, tendon or muscle. 7x2x3, tunneling at 12'oclock-6cm, 9'oclock-1.5cm. (Active)  10/19/20 2020  Location: Buttocks  Location Orientation: Left  Staging: Stage 4 - Full thickness tissue loss with exposed bone, tendon or muscle.  Wound Description (Comments): 7x2x3, tunneling at 12'oclock-6cm, 9'oclock-1.5cm.  Present on Admission: Yes     Pressure Injury 10/19/20 Buttocks Mid Stage 3 -  Full thickness tissue loss. Subcutaneous fat may be visible but bone, tendon or muscle are NOT exposed. intragluteal cluster, 8.5x5 (Active)  10/19/20 2020  Location: Buttocks  Location Orientation: Mid  Staging: Stage 3 -  Full thickness tissue loss. Subcutaneous fat may be visible but bone, tendon or muscle are NOT exposed.  Wound Description (Comments): intragluteal cluster, 8.5x5  Present on Admission: Yes     Pressure Injury 11/05/20 Labia Right;Left Stage 3 -  Full thickness tissue loss. Subcutaneous fat may be visible but bone, tendon or muscle are NOT exposed. open skin on labia (Active)  11/05/20 2230  Location: Labia  Location  Orientation: Right;Left  Staging: Stage 3 -  Full thickness tissue loss. Subcutaneous fat may be visible but bone, tendon or muscle are NOT exposed.  Wound Description (Comments): open skin on labia  Present on Admission:      Pressure Injury 03/03/23 Sacrum Mid Stage 2 -  Partial thickness loss of dermis presenting as a shallow open injury with a red, pink wound bed without slough. (Active)  03/03/23   Location: Sacrum  Location Orientation: Mid  Staging: Stage 2 -  Partial thickness loss of dermis presenting as a shallow open injury with a red, pink wound bed without slough.  Wound Description (Comments):   Present on Admission: Yes     Pressure Injury Buttocks Right;Left Stage 2 -  Partial thickness loss of dermis presenting as a shallow open injury with a red, pink wound bed without slough. (Active)     Location: Buttocks  Location Orientation: Right;Left  Staging: Stage 2 -  Partial thickness loss of dermis presenting as a shallow open injury with a red, pink wound bed without slough.  Wound Description (Comments):   Present on Admission: Yes    Malnutrition Type:  Nutrition Problem: Increased nutrient needs Etiology: wound healing   Malnutrition Characteristics:  Signs/Symptoms: estimated needs   Nutrition Interventions:  Interventions: Tube feeding  Estimated body mass index is 26.31 kg/m as calculated from the following:   Height as of this encounter: 5\' 7"  (1.702 m).   Weight as of this encounter: 76.2 kg.  Code Status: DNR DVT Prophylaxis:  SCDs Start: 03/02/23 2345 apixaban (ELIQUIS) tablet 5 mg   Level of Care: Level of care: Telemetry Medical Family Communication: none at bedside.   Disposition Plan:     Remains inpatient appropriate: IV antibiotics and therapy evaluation.   Procedures:  None.   Consultants:   None.   Antimicrobials:   Anti-infectives (From admission, onward)    Start     Dose/Rate Route Frequency Ordered Stop   03/03/23 2200   cefTRIAXone (ROCEPHIN) 1 g in sodium chloride 0.9 % 100 mL IVPB        1 g 200 mL/hr over 30 Minutes Intravenous Every 24 hours 03/02/23 2346 03/09/23 2159   03/03/23 0000  cefTRIAXone (ROCEPHIN) 1 g in sodium chloride 0.9 % 100 mL IVPB  Status:  Discontinued        1 g 200 mL/hr over 30 Minutes Intravenous Every 24 hours 03/02/23 2345 03/02/23 2346   03/02/23 2300  cefTRIAXone (ROCEPHIN) 2 g in sodium chloride 0.9 % 100 mL IVPB        2 g 200 mL/hr over 30 Minutes Intravenous  Once 03/02/23 2258 03/03/23 0058        Medications  Scheduled Meds:  amLODipine  5 mg Oral Daily   apixaban  5 mg Oral BID   atorvastatin  20 mg Oral  QHS   busPIRone  15 mg Oral BID   clotrimazole-betamethasone  1 Application Topical BID   feeding supplement (PROSource TF20)  60 mL Per Tube BID   free water  100 mL Per Tube Q4H   lisinopril  20 mg Oral Daily   liver oil-zinc oxide   Topical BID   memantine  28 mg Oral Daily   metoCLOPramide (REGLAN) injection  5 mg Intravenous Q8H   multivitamin  1 tablet Oral Daily   nutrition supplement (JUVEN)  1 packet Per Tube BID BM   nystatin  1 Application Topical BID   sodium chloride flush  3 mL Intravenous Q12H   Continuous Infusions:  sodium chloride     cefTRIAXone (ROCEPHIN)  IV 1 g (03/03/23 2113)   feeding supplement (OSMOLITE 1.2 CAL) Stopped (03/03/23 1600)   PRN Meds:.sodium chloride, acetaminophen **OR** acetaminophen, albuterol, butalbital-acetaminophen-caffeine, cycloSPORINE, fluticasone, gentamicin cream, hydrALAZINE, nitroGLYCERIN, prochlorperazine, sodium chloride flush    Subjective:   [name] was seen and examined today. Alert and answering simple questions. Reporting headache.   Objective:   Vitals:   03/04/23 0246 03/04/23 0416 03/04/23 0622 03/04/23 0720  BP: (!) 150/68 (!) 165/71 126/68 (!) 134/53  Pulse: 96 100 97 94  Resp:  18  16  Temp: 99.9 F (37.7 C) 100 F (37.8 C) 99.1 F (37.3 C) 98.1 F (36.7 C)   TempSrc:    Oral  SpO2: 96% 97% 100% 99%  Weight:      Height:        Intake/Output Summary (Last 24 hours) at 03/04/2023 0925 Last data filed at 03/04/2023 0400 Gross per 24 hour  Intake 345.83 ml  Output 800 ml  Net -454.17 ml   Filed Weights   03/03/23 1446 03/04/23 0204  Weight: 76 kg 76.2 kg     Exam General exam: Appears calm and comfortable  Respiratory system: Clear to auscultation. Respiratory effort normal. Cardiovascular system: S1 & S2 heard, RRR. No JVD,  Gastrointestinal system: Abdomen is nondistended, soft and nontender.  Central nervous system: Alert and oriented. Grossly non focal.  Extremities: No pedal edema.  Skin: No rashes, lesions or ulcers Psychiatry: Mood & affect appropriate.     Data Reviewed:  I have personally reviewed following labs and imaging studies   CBC Lab Results  Component Value Date   WBC 11.9 (H) 03/03/2023   RBC 4.64 03/03/2023   HGB 15.1 (H) 03/03/2023   HCT 48.9 (H) 03/03/2023   MCV 105.4 (H) 03/03/2023   MCH 32.5 03/03/2023   PLT 85 (L) 03/03/2023   MCHC 30.9 03/03/2023   RDW 13.4 03/03/2023   LYMPHSABS 1.0 03/02/2023   MONOABS 0.6 03/02/2023   EOSABS 0.1 03/02/2023   BASOSABS 0.0 03/02/2023     Last metabolic panel Lab Results  Component Value Date   NA 139 03/03/2023   K 3.9 03/03/2023   CL 104 03/03/2023   CO2 22 03/03/2023   BUN 11 03/03/2023   CREATININE 0.46 03/03/2023   GLUCOSE 107 (H) 03/03/2023   GFRNONAA >60 03/03/2023   GFRAA >60 05/14/2020   CALCIUM 9.1 03/03/2023   PHOS 3.0 11/04/2020   PROT 6.8 03/03/2023   ALBUMIN 2.9 (L) 03/03/2023   BILITOT 0.9 03/03/2023   ALKPHOS 63 03/03/2023   AST 35 03/03/2023   ALT 23 03/03/2023   ANIONGAP 13 03/03/2023    CBG (last 3)  Recent Labs    03/04/23 0000 03/04/23 0412 03/04/23 0718  GLUCAP 161* 148* 128*  Coagulation Profile: No results for input(s): "INR", "PROTIME" in the last 168 hours.   Radiology Studies: DG Abd 2  Views  Result Date: 03/03/2023 CLINICAL DATA:  Nausea, vomiting. EXAM: ABDOMEN - 2 VIEW COMPARISON:  February 04, 2022. FINDINGS: The bowel gas pattern is normal. There is no evidence of free air. Gastrostomy tube is again noted in left upper quadrant. IVC filter is noted. IMPRESSION: No abnormal bowel dilatation. Aortic Atherosclerosis (ICD10-I70.0). Electronically Signed   By: Lupita Raider M.D.   On: 03/03/2023 17:44   DG Chest Portable 1 View  Result Date: 03/02/2023 CLINICAL DATA:  Altered mental status. EXAM: PORTABLE CHEST 1 VIEW COMPARISON:  Chest radiograph and CT 01/26/2023 FINDINGS: The cardiomediastinal silhouette is unchanged with normal heart size. Tortuosity and atherosclerotic calcification are noted of the thoracic aorta. Lung volumes are low with mild streaky opacities in the lung bases favored to reflect atelectasis. The interstitial markings are diffusely increased bilaterally with a slightly nodular appearance in the mid to lower lungs. Curvilinear lucency projecting over the left upper hemithorax likely reflects a skin fold. No definite pneumothorax or sizable pleural effusion is identified. Degenerative changes are noted at the shoulders. IMPRESSION: Low lung volumes with bibasilar atelectasis and diffusely increased interstitial markings which could reflect atypical infection or mild interstitial edema. Electronically Signed   By: Sebastian Ache M.D.   On: 03/02/2023 15:54   CT Head Wo Contrast  Result Date: 03/02/2023 CLINICAL DATA:  Mental status change, unknown cause. EXAM: CT HEAD WITHOUT CONTRAST TECHNIQUE: Contiguous axial images were obtained from the base of the skull through the vertex without intravenous contrast. RADIATION DOSE REDUCTION: This exam was performed according to the departmental dose-optimization program which includes automated exposure control, adjustment of the mA and/or kV according to patient size and/or use of iterative reconstruction technique. COMPARISON:   Head CT 01/27/2023 FINDINGS: Brain: There is no evidence of an acute infarct, intracranial hemorrhage, mass, midline shift, or extra-axial fluid collection. Mild cerebral atrophy is within normal limits for age. Cerebral white matter hypodensities are unchanged and nonspecific but compatible with mild chronic small vessel ischemic disease. A small chronic infarct in the superior left cerebellar hemisphere is unchanged. Vascular: Calcified atherosclerosis at the skull base. No hyperdense vessel. Skull: No acute fracture or suspicious osseous lesion. Sinuses/Orbits: Visualized paranasal sinuses and mastoid air cells are clear. Bilateral cataract extraction. Other: None. IMPRESSION: 1. No evidence of acute intracranial abnormality. 2. Mild chronic small vessel ischemic disease. Electronically Signed   By: Sebastian Ache M.D.   On: 03/02/2023 15:50       Kathlen Mody M.D. [facility] 03/04/2023, 9:25 AM  Available via Epic secure chat 7am-7pm After 7 pm, please refer to night coverage provider listed on amion.

## 2023-03-04 NOTE — Progress Notes (Signed)
PT Cancellation Note  Patient Details Name: [name] MRN: [id number] DOB: 25-May-1941   Cancelled Treatment:    Reason Eval/Treat Not Completed: PT screened, no needs identified, will sign off per OT's conversation with family, patient is at baseline (total assist) with no skilled needs at this time, no concerns from family at this time. Will sign off thank you for the referral.   Nedra Hai, PT, DPT 03/04/23 1:03 PM

## 2023-03-05 DIAGNOSIS — G9341 Metabolic encephalopathy: Secondary | ICD-10-CM | POA: Diagnosis not present

## 2023-03-05 LAB — GLUCOSE, CAPILLARY
Glucose-Capillary: 133 mg/dL — ABNORMAL HIGH (ref 70–99)
Glucose-Capillary: 136 mg/dL — ABNORMAL HIGH (ref 70–99)
Glucose-Capillary: 147 mg/dL — ABNORMAL HIGH (ref 70–99)
Glucose-Capillary: 156 mg/dL — ABNORMAL HIGH (ref 70–99)
Glucose-Capillary: 160 mg/dL — ABNORMAL HIGH (ref 70–99)
Glucose-Capillary: 172 mg/dL — ABNORMAL HIGH (ref 70–99)

## 2023-03-05 MED ORDER — BUTALBITAL-APAP-CAFFEINE 50-325-40 MG PO TABS: 1.0000 | ORAL_TABLET | Freq: Two times a day (BID) | ORAL | Status: DC | PRN

## 2023-03-05 MED ORDER — POLYETHYLENE GLYCOL 3350 17 G PO PACK
17.0000 g | PACK | Freq: Two times a day (BID) | ORAL | Status: DC
  Administered 2023-03-05 – 2023-03-07 (×3): 17 g
  Filled 2023-03-05 (×3): qty 1

## 2023-03-05 MED ORDER — LINACLOTIDE 145 MCG PO CAPS
290.0000 ug | ORAL_CAPSULE | Freq: Every day | ORAL | Status: DC
  Administered 2023-03-06 – 2023-03-07 (×2): 290 ug via ORAL
  Filled 2023-03-05 (×2): qty 2

## 2023-03-05 MED ORDER — POLYETHYLENE GLYCOL 3350 17 G PO PACK: 17.0000 g | PACK | Freq: Two times a day (BID) | ORAL | Status: DC

## 2023-03-05 MED ORDER — POLYVINYL ALCOHOL 1.4 % OP SOLN: 2.0000 [drp] | OPHTHALMIC | Status: DC | PRN

## 2023-03-05 MED ORDER — CEFADROXIL 500 MG PO CAPS
500.0000 mg | ORAL_CAPSULE | Freq: Two times a day (BID) | ORAL | Status: DC
  Administered 2023-03-05 – 2023-03-07 (×4): 500 mg via ORAL
  Filled 2023-03-05 (×4): qty 1

## 2023-03-05 MED ORDER — METOCLOPRAMIDE HCL 5 MG PO TABS: 5.0000 mg | ORAL_TABLET | Freq: Three times a day (TID) | ORAL | Status: DC | PRN

## 2023-03-05 NOTE — Progress Notes (Signed)
Tube feeds running in room at 77mL/hr. Date on TF states they were started 7/23 at 1600. No documentation from previous shifts to show this

## 2023-03-05 NOTE — Care Management Important Message (Signed)
Important Message  Patient Details  Name: [name] MRN: [id number] Date of Birth: 10-16-1940   Medicare Important Message Given:  Yes     Dorena Bodo 03/05/2023, 2:29 PM

## 2023-03-05 NOTE — Progress Notes (Addendum)
PROGRESS NOTE    [name]  ZOX:[id number] DOB: 03-19-1941 DOA: 03/02/2023 PCP: [name], MD  Chief Complaint  Patient presents with   Altered Mental Status    Possible overdose    Brief Narrative:   82 year old female with HTN, [place] Hemrick-fib on Eliquis, DVT and PE on Eliquis, prior stroke, bedbound, sacral stage II ulcer, anxiety, dysphagia, tube feed dependent presented to ED from home due to altered mental status.  Patient takes tramadol chronically and her son found her confused.  Patient's son called EMS stating that she was unresponsive, had given Kel Senn dose of narcan and patient became more alert.  Son also reported that she has been getting weaker over the last few days.  Work up is positive for UTI. She's now on oral antibiotics.  See below for additional details   Assessment & Plan:   Principal Problem:   Acute metabolic encephalopathy Active Problems:   History of CVA (cerebrovascular accident)   Hyperlipidemia   Essential hypertension   Dementia without behavioral disturbance (HCC)   Acute cystitis   Hyperammonemia (HCC)   Atrial fibrillation, chronic (HCC)   History of PE and DVT (deep venous thrombosis) (HCC)   Generalized anxiety disorder   On tube feeding diet   Elevated bilirubin  Acute metabolic encephalopathy Suspected due to medications (tramadol) and UTI  Patient on oxycodone as needed at home, became lethargic was given Saiya Crist dose of Narcan on admission and patient's mental status improved. CT of the head without contrast does not show any acute intracranial abnormalities. Treating for UTI    Proteus Mirabilis Urinary tract infection Narrow to duricef  Chronic Pain Need to reconsider pain meds at discharge with confusion at presentation  History of CVA;  Continue with Eliquis and Lipitor. Patient is on tube feeds   Hypertensive Urgency:  Continue with lisinopril 20 mg daily and Norvasc 5 mg daily.  BP ok today   Dementia without behavioral  disturbance, anxiety Continue with BuSpar and memantine.   Chronic atrial fibrillation Continue with Eliquis for anticoagulation Rate well-controlled.  History of pulmonary embolism and DVT Continue with Eliquis   Hyperammonemia Appears to have resolved  Pressure Ulcer  Pressure Injury 03/03/23 Sacrum Mid Stage 2 -  Partial thickness loss of dermis presenting as York Valliant shallow open injury with Kathie Posa red, pink wound bed without slough. (Active)  03/03/23   Location: Sacrum  Location Orientation: Mid  Staging: Stage 2 -  Partial thickness loss of dermis presenting as Kiyona Mcnall shallow open injury with Zauria Dombek red, pink wound bed without slough.  Wound Description (Comments):   Present on Admission: Yes  Wound care     DVT prophylaxis: eliquis Code Status: DNR Family Communication: none Disposition:   Status is: Inpatient Remains inpatient appropriate because: continuing inpatient care   Consultants:  none  Procedures:  none  Antimicrobials:  Anti-infectives (From admission, onward)    Start     Dose/Rate Route Frequency Ordered Stop   03/03/23 2200  cefTRIAXone (ROCEPHIN) 1 g in sodium chloride 0.9 % 100 mL IVPB        1 g 200 mL/hr over 30 Minutes Intravenous Every 24 hours 03/02/23 2346 03/09/23 2159   03/03/23 0000  cefTRIAXone (ROCEPHIN) 1 g in sodium chloride 0.9 % 100 mL IVPB  Status:  Discontinued        1 g 200 mL/hr over 30 Minutes Intravenous Every 24 hours 03/02/23 2345 03/02/23 2346   03/02/23 2300  cefTRIAXone (ROCEPHIN) 2 g in sodium chloride  0.9 % 100 mL IVPB        2 g 200 mL/hr over 30 Minutes Intravenous  Once 03/02/23 2258 03/03/23 0058       Subjective: Complaining of headache  Objective: Vitals:   03/05/23 0013 03/05/23 0407 03/05/23 0500 03/05/23 0726  BP: (!) 159/70 (!) 149/85  (!) 154/76  Pulse: 88 100  95  Resp: 18 18  16   Temp: 99.1 F (37.3 C) 98.7 F (37.1 C)  97.7 F (36.5 C)  TempSrc:    Oral  SpO2: 97% 98%  100%  Weight:   75.3 kg    Height:        Intake/Output Summary (Last 24 hours) at 03/05/2023 1538 Last data filed at 03/05/2023 0500 Gross per 24 hour  Intake --  Output 500 ml  Net -500 ml   Filed Weights   03/03/23 1446 03/04/23 0204 03/05/23 0500  Weight: 76 kg 76.2 kg 75.3 kg    Examination:  General exam: Appears calm and comfortable  Respiratory system: Clear to auscultation. Respiratory effort normal. Cardiovascular system: RRR Gastrointestinal system: Abdomen is nondistended, soft and nontender.  Central nervous system: Alert and oriented. No focal neurological deficits. Extremities: trace edema    Data Reviewed: I have personally reviewed following labs and imaging studies  CBC: Recent Labs  Lab 03/02/23 1423 03/02/23 1441 03/02/23 1442 03/03/23 0914  WBC 12.0*  --   --  11.9*  NEUTROABS 10.3*  --   --   --   HGB 14.8 16.0* 16.0* 15.1*  HCT 46.8* 47.0* 47.0* 48.9*  MCV 104.5*  --   --  105.4*  PLT 91*  --   --  85*    Basic Metabolic Panel: Recent Labs  Lab 03/02/23 1423 03/02/23 1441 03/02/23 1442 03/03/23 1115  NA 138 142 143 139  K 3.9 3.8 3.8 3.9  CL 103 104  --  104  CO2 23  --   --  22  GLUCOSE 131* 130*  --  107*  BUN 19 27*  --  11  CREATININE 0.53 0.40*  --  0.46  CALCIUM 8.8*  --   --  9.1  MG 2.2  --   --   --     GFR: Estimated Creatinine Clearance: 57.4 mL/min (by C-G formula based on SCr of 0.46 mg/dL).  Liver Function Tests: Recent Labs  Lab 03/02/23 1423 03/03/23 1115  AST 48* 35  ALT 28 23  ALKPHOS 61 63  BILITOT 1.3* 0.9  PROT 6.5 6.8  ALBUMIN 2.9* 2.9*    CBG: Recent Labs  Lab 03/04/23 1933 03/05/23 0016 03/05/23 0412 03/05/23 0751 03/05/23 1136  GLUCAP 147* 160* 147* 172* 156*     Recent Results (from the past 240 hour(s))  Urine Culture     Status: Abnormal   Collection Time: 03/02/23 11:21 PM   Specimen: Urine, Clean Catch  Result Value Ref Range Status   Specimen Description URINE, CLEAN CATCH  Final   Special  Requests   Final    ADDED 2321 Performed at [facility], [address], [place], [place] [address]    Culture >=100,000 COLONIES/mL PROTEUS MIRABILIS (Victorian Gunn)  Final   Report Status 03/04/2023 FINAL  Final   Organism ID, Bacteria PROTEUS MIRABILIS (Guiliana Shor)  Final      Susceptibility   Proteus mirabilis - MIC*    AMPICILLIN <=2 SENSITIVE Sensitive     CEFAZOLIN <=4 SENSITIVE Sensitive     CEFEPIME <=0.12 SENSITIVE  Sensitive     CEFTRIAXONE <=0.25 SENSITIVE Sensitive     CIPROFLOXACIN <=0.25 SENSITIVE Sensitive     GENTAMICIN <=1 SENSITIVE Sensitive     IMIPENEM 2 SENSITIVE Sensitive     NITROFURANTOIN 128 RESISTANT Resistant     TRIMETH/SULFA <=20 SENSITIVE Sensitive     AMPICILLIN/SULBACTAM <=2 SENSITIVE Sensitive     PIP/TAZO <=4 SENSITIVE Sensitive     * >=100,000 COLONIES/mL PROTEUS MIRABILIS         Radiology Studies: DG Abd 2 Views  Result Date: 03/03/2023 CLINICAL DATA:  Nausea, vomiting. EXAM: ABDOMEN - 2 VIEW COMPARISON:  February 04, 2022. FINDINGS: The bowel gas pattern is normal. There is no evidence of free air. Gastrostomy tube is again noted in left upper quadrant. IVC filter is noted. IMPRESSION: No abnormal bowel dilatation. Aortic Atherosclerosis (ICD10-I70.0). Electronically Signed   By: Lupita Raider M.D.   On: 03/03/2023 17:44        Scheduled Meds:  amLODipine  5 mg Oral Daily   apixaban  5 mg Oral BID   atorvastatin  20 mg Oral QHS   busPIRone  15 mg Oral BID   clotrimazole-betamethasone  1 Application Topical BID   feeding supplement (PROSource TF20)  60 mL Per Tube BID   free water  100 mL Per Tube Q4H   lisinopril  20 mg Oral Daily   liver oil-zinc oxide   Topical BID   memantine  28 mg Oral Daily   multivitamin  1 tablet Oral Daily   nutrition supplement (JUVEN)  1 packet Per Tube BID BM   nystatin  1 Application Topical BID   sodium chloride flush  3 mL Intravenous Q12H   Continuous Infusions:  sodium chloride     cefTRIAXone (ROCEPHIN)  IV  1 g (03/04/23 2158)   feeding supplement (OSMOLITE 1.2 CAL) 70 mL/hr at 03/05/23 1200     LOS: 3 days    Time spent: over 30 min    Lacretia Nicks, MD [facility]   To contact the attending provider between 7A-7P or the covering provider during after hours 7P-7A, please log into the web site [email or web address] and access using universal [place] password for that web site. If you do not have the password, please call the hospital operator.  03/05/2023, 3:39 PM

## 2023-03-06 DIAGNOSIS — G9341 Metabolic encephalopathy: Secondary | ICD-10-CM | POA: Diagnosis not present

## 2023-03-06 LAB — CBC WITH DIFFERENTIAL/PLATELET
Abs Immature Granulocytes: 0.02 10*3/uL (ref 0.00–0.07)
Basophils Absolute: 0 10*3/uL (ref 0.0–0.1)
Eosinophils Absolute: 0.2 10*3/uL (ref 0.0–0.5)
HCT: 40.4 % (ref 36.0–46.0)
Hemoglobin: 13.1 g/dL (ref 12.0–15.0)
Immature Granulocytes: 0 %
Lymphocytes Relative: 18 %
Lymphs Abs: 1.4 10*3/uL (ref 0.7–4.0)
MCH: 34.2 pg — ABNORMAL HIGH (ref 26.0–34.0)
MCHC: 32.4 g/dL (ref 30.0–36.0)
MCV: 105.5 fL — ABNORMAL HIGH (ref 80.0–100.0)
Monocytes Absolute: 1 10*3/uL (ref 0.1–1.0)
Monocytes Relative: 13 %
Neutrophils Relative %: 67 %
Platelets: 92 10*3/uL — ABNORMAL LOW (ref 150–400)
RBC: 3.83 MIL/uL — ABNORMAL LOW (ref 3.87–5.11)
RDW: 13.6 % (ref 11.5–15.5)
WBC: 7.6 10*3/uL (ref 4.0–10.5)
nRBC: 0 % (ref 0.0–0.2)

## 2023-03-06 LAB — COMPREHENSIVE METABOLIC PANEL
AST: 20 U/L (ref 15–41)
Albumin: 2.4 g/dL — ABNORMAL LOW (ref 3.5–5.0)
Alkaline Phosphatase: 43 U/L (ref 38–126)
Anion gap: 6 (ref 5–15)
BUN: 28 mg/dL — ABNORMAL HIGH (ref 8–23)
Chloride: 106 mmol/L (ref 98–111)
Creatinine, Ser: 0.47 mg/dL (ref 0.44–1.00)
GFR, Estimated: >60 mL/min (ref >60)
Potassium: 3.6 mmol/L (ref 3.5–5.1)
Sodium: 139 mmol/L (ref 135–145)
Total Bilirubin: 0.8 mg/dL (ref 0.3–1.2)
Total Protein: 5.6 g/dL — ABNORMAL LOW (ref 6.5–8.1)

## 2023-03-06 LAB — GLUCOSE, CAPILLARY
Glucose-Capillary: 114 mg/dL — ABNORMAL HIGH (ref 70–99)
Glucose-Capillary: 117 mg/dL — ABNORMAL HIGH (ref 70–99)
Glucose-Capillary: 123 mg/dL — ABNORMAL HIGH (ref 70–99)
Glucose-Capillary: 135 mg/dL — ABNORMAL HIGH (ref 70–99)
Glucose-Capillary: 135 mg/dL — ABNORMAL HIGH (ref 70–99)

## 2023-03-06 LAB — MAGNESIUM: Magnesium: 2 mg/dL (ref 1.7–2.4)

## 2023-03-06 LAB — PHOSPHORUS: Phosphorus: 1.7 mg/dL — ABNORMAL LOW (ref 2.5–4.6)

## 2023-03-06 MED ORDER — K PHOS MONO-SOD PHOS DI & MONO 155-852-130 MG PO TABS
500.0000 mg | ORAL_TABLET | Freq: Four times a day (QID) | ORAL | Status: AC
  Administered 2023-03-06 – 2023-03-07 (×4): 500 mg
  Filled 2023-03-06 (×4): qty 2

## 2023-03-06 NOTE — Progress Notes (Signed)
PROGRESS NOTE    [name]  NWG:[id number] DOB: [birth year] DOA: 03/02/2023 PCP: [name], MD  Chief Complaint  Patient presents with   Altered Mental Status    Possible overdose    Brief Narrative:   82 year old female with HTN, Taris Galindo-fib on Eliquis, DVT and PE on Eliquis, prior stroke, bedbound, sacral stage II ulcer, anxiety, dysphagia, tube feed dependent presented to ED from home due to altered mental status.  Patient takes tramadol chronically and her son found her confused.  Patient's son called EMS stating that she was unresponsive, had given Martavis Gurney dose of narcan and patient became more alert.  Son also reported that she has been getting weaker over the last few days.  Work up is positive for UTI. She's now on oral antibiotics.  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 CT of the head without contrast does not show any acute intracranial abnormalities. Treating for UTI    Proteus Mirabilis Urinary tract infection Narrow to duricef  Chronic Pain Need to reconsider pain meds at discharge with confusion at presentation hasn't recently received any opiates and doing fine - my recommendation would be to hold at discharge and if needed resumption to start low  History of CVA;  Continue with Eliquis and Lipitor. Patient is on tube  feeds   Hypertensive Urgency:  Continue with lisinopril 20 mg daily and Norvasc 5 mg daily.  BP ok today   Dementia without behavioral disturbance, anxiety Continue with BuSpar and memantine.   Chronic atrial fibrillation Continue with Eliquis for anticoagulation Rate well-controlled.  History of pulmonary embolism and DVT Continue with Eliquis   Hyperammonemia Appears to have resolved  Pressure Ulcer  Pressure Injury 10/19/20 Vertebral column Left;Mid Unstageable - Full thickness tissue loss in which the base of the injury is covered by slough (yellow, tan, gray, green or brown) and/or eschar (tan, brown or black) in the wound bed. eschar d/t removal  (Active)  10/19/20 2020  Location: Vertebral column  Location Orientation: Left;Mid  Staging: Unstageable - Full thickness tissue loss in which the base of the injury is covered by slough (yellow, tan, gray, green or brown) and/or eschar (tan, brown or black) in the wound bed.  Wound Description (Comments): eschar d/t removal of medical equipment which became infected (6x5)  Present on Admission: Yes     Pressure Injury 03/03/23 Sacrum Mid Stage 2 -  Partial thickness loss of dermis presenting as Kerem Gilmer shallow open injury with Ladana Chavero red, pink wound bed without slough. (Active)  03/03/23   Location: Sacrum  Location Orientation: Mid  Staging: Stage 2 -  Partial thickness loss of dermis presenting as Woodruff Skirvin shallow open injury with Navy Rothschild red, pink wound bed without slough.  Wound Description (Comments):   Present on Admission: Yes     Pressure Injury Buttocks Left Stage 2 -  Partial thickness loss  of dermis presenting as Amiria Orrison shallow open injury with Steffan Caniglia red, pink wound bed without slough. (Active)     Location: Buttocks  Location Orientation: Left  Staging: Stage 2 -  Partial thickness loss of dermis presenting as Kaydra Borgen shallow open injury with Valaree Fresquez red, pink wound bed without slough.  Wound Description (Comments):   Present on Admission: Yes  Wound  care     DVT prophylaxis: eliquis Code Status: DNR Family Communication: son 7/24 and 7/25 Disposition:   Status is: Inpatient Remains inpatient appropriate because: continuing inpatient care   Consultants:  none  Procedures:  none  Antimicrobials:  Anti-infectives (From admission, onward)    Start     Dose/Rate Route Frequency Ordered Stop   03/05/23 2200  cefadroxil (DURICEF) capsule 500 mg        500 mg Oral 2 times daily 03/05/23 1550 03/09/23 2159   03/03/23 2200  cefTRIAXone (ROCEPHIN) 1 g in sodium chloride 0.9 % 100 mL IVPB  Status:  Discontinued        1 g 200 mL/hr over 30 Minutes Intravenous Every 24 hours 03/02/23 2346 03/05/23 1550   03/03/23 0000  cefTRIAXone (ROCEPHIN) 1 g in sodium chloride 0.9 % 100 mL IVPB  Status:  Discontinued        1 g 200 mL/hr over 30 Minutes Intravenous Every 24 hours 03/02/23 2345 03/02/23 2346   03/02/23 2300  cefTRIAXone (ROCEPHIN) 2 g in sodium chloride 0.9 % 100 mL IVPB        2 g 200 mL/hr over 30 Minutes Intravenous  Once 03/02/23 2258 03/03/23 0058       Subjective: No complaints today  Objective: Vitals:   03/06/23 0500 03/06/23 0715 03/06/23 1145 03/06/23 1600  BP:  (!) 103/46 (!) 102/46 (!) 120/53  Pulse:  77 70 70  Resp:  18 18 18   Temp:  98.4 F (36.9 C) 98 F (36.7 C) 97.7 F (36.5 C)  TempSrc:  Oral Oral   SpO2:  95% 99% 98%  Weight: 77.1 kg     Height:        Intake/Output Summary (Last 24 hours) at 03/06/2023 1639 Last data filed at 03/06/2023 1152 Gross per 24 hour  Intake 120 ml  Output 1000 ml  Net -880 ml   Filed Weights   03/04/23 0204 03/05/23 0500 03/06/23 0500  Weight: 76.2 kg 75.3 kg 77.1 kg    Examination:  General: No acute distress. Cardiovascular: RRR Lungs: unlabored Abdomen: Soft, nontender, nondistended - g tube  Neurological: Alert. Moves all extremities 4.  Extremities: trace edema    Data Reviewed: I have personally reviewed following labs and imaging  studies  CBC: Recent Labs  Lab 03/02/23 1423 03/02/23 1441 03/02/23 1442 03/03/23 0914 03/05/23 2337  WBC 12.0*  --   --  11.9* 7.6  NEUTROABS 10.3*  --   --   --  5.1  HGB 14.8 16.0* 16.0* 15.1* 13.1  HCT 46.8* 47.0* 47.0* 48.9* 40.4  MCV 104.5*  --   --  105.4* 105.5*  PLT 91*  --   --  85* 92*    Basic Metabolic Panel: Recent Labs  Lab 03/02/23 1423 03/02/23 1441 03/02/23 1442 03/03/23 1115 03/05/23 2337  NA 138 142 143 139 139  K 3.9 3.8 3.8 3.9 3.6  CL 103 104  --  104 106  CO2 23  --   --  22 27  GLUCOSE 131* 130*  --  107* 163*  BUN 19  27*  --  11 28*  CREATININE 0.53 0.40*  --  0.46 0.47  CALCIUM 8.8*  --   --  9.1 8.6*  MG 2.2  --   --   --  2.0  PHOS  --   --   --   --  1.7*    GFR: Estimated Creatinine Clearance: 58 mL/min (by C-G formula based on SCr of 0.47 mg/dL).  Liver Function Tests: Recent Labs  Lab 03/02/23 1423 03/03/23 1115 03/05/23 2337  AST 48* 35 20  ALT 28 23 20   ALKPHOS 61 63 43  BILITOT 1.3* 0.9 0.8  PROT 6.5 6.8 5.6*  ALBUMIN 2.9* 2.9* 2.4*    CBG: Recent Labs  Lab 03/06/23 0024 03/06/23 0447 03/06/23 0720 03/06/23 1149 03/06/23 1603  GLUCAP 135* 117* 135* 123* 114*     Recent Results (from the past 240 hour(s))  Urine Culture     Status: Abnormal   Collection Time: 03/02/23 11:21 PM   Specimen: Urine, Clean Catch  Result Value Ref Range Status   Specimen Description URINE, CLEAN CATCH  Final   Special Requests   Final    ADDED 2321 Performed at [facility], [address], [place], [place] [address]    Culture >=100,000 COLONIES/mL PROTEUS MIRABILIS (Diany Formosa)  Final   Report Status 03/04/2023 FINAL  Final   Organism ID, Bacteria PROTEUS MIRABILIS (Areatha Kalata)  Final      Susceptibility   Proteus mirabilis - MIC*    AMPICILLIN <=2 SENSITIVE Sensitive     CEFAZOLIN <=4 SENSITIVE Sensitive     CEFEPIME <=0.12 SENSITIVE Sensitive     CEFTRIAXONE <=0.25 SENSITIVE Sensitive     CIPROFLOXACIN <=0.25 SENSITIVE  Sensitive     GENTAMICIN <=1 SENSITIVE Sensitive     IMIPENEM 2 SENSITIVE Sensitive     NITROFURANTOIN 128 RESISTANT Resistant     TRIMETH/SULFA <=20 SENSITIVE Sensitive     AMPICILLIN/SULBACTAM <=2 SENSITIVE Sensitive     PIP/TAZO <=4 SENSITIVE Sensitive     * >=100,000 COLONIES/mL PROTEUS MIRABILIS         Radiology Studies: No results found.      Scheduled Meds:  amLODipine  5 mg Oral Daily   apixaban  5 mg Oral BID   atorvastatin  20 mg Oral QHS   busPIRone  15 mg Oral BID   cefadroxil  500 mg Oral BID   clotrimazole-betamethasone  1 Application Topical BID   feeding supplement (PROSource TF20)  60 mL Per Tube BID   free water  100 mL Per Tube Q4H   linaclotide  290 mcg Oral QAC breakfast   lisinopril  20 mg Oral Daily   liver oil-zinc oxide   Topical BID   memantine  28 mg Oral Daily   multivitamin  1 tablet Oral Daily   nutrition supplement (JUVEN)  1 packet Per Tube BID BM   nystatin  1 Application Topical BID   phosphorus  500 mg Per Tube QID   polyethylene glycol  17 g Per Tube BID   sodium chloride flush  3 mL Intravenous Q12H   Continuous Infusions:  sodium chloride     feeding supplement (OSMOLITE 1.2 CAL) 70 mL/hr at 03/05/23 1600     LOS: 4 days    Time spent: over 30 min    Lacretia Nicks, MD [facility]   To contact the attending provider between 7A-7P or the covering provider during after hours 7P-7A, please log into the web site [email or web address]  and access using universal [place] password for that web site. If you do not have the password, please call the hospital operator.  03/06/2023, 4:39 PM

## 2023-03-06 NOTE — Plan of Care (Signed)

## 2023-03-07 DIAGNOSIS — G9341 Metabolic encephalopathy: Secondary | ICD-10-CM | POA: Diagnosis not present

## 2023-03-07 LAB — CBC WITH DIFFERENTIAL/PLATELET
Neutro Abs: 5.4 10*3/uL (ref 1.7–7.7)
Platelets: 86 10*3/uL — ABNORMAL LOW (ref 150–400)

## 2023-03-07 LAB — GLUCOSE, CAPILLARY
Glucose-Capillary: 123 mg/dL — ABNORMAL HIGH (ref 70–99)
Glucose-Capillary: 115 mg/dL — ABNORMAL HIGH (ref 70–99)
Glucose-Capillary: 161 mg/dL — ABNORMAL HIGH (ref 70–99)
Glucose-Capillary: 89 mg/dL (ref 70–99)
Glucose-Capillary: 170 mg/dL — ABNORMAL HIGH (ref 70–99)

## 2023-03-07 MED ORDER — OSMOLITE 1.5 CAL PO LIQD
1000.0000 mL | ORAL | Status: DC
  Filled 2023-03-07 (×2): qty 1000

## 2023-03-07 MED ORDER — FREE WATER
135.0000 mL | Status: DC
  Administered 2023-03-07: 135 mL

## 2023-03-07 MED ORDER — AMLODIPINE BESYLATE 5 MG PO TABS: 5.0000 mg | ORAL_TABLET | Freq: Every day | ORAL | 1 refills | Status: AC

## 2023-03-07 MED ORDER — PROSOURCE TF20 ENFIT COMPATIBL EN LIQD: 60.0000 mL | Freq: Two times a day (BID) | ENTERAL | 0 refills | Status: DC

## 2023-03-07 MED ORDER — LISINOPRIL 10 MG PO TABS: 20.0000 mg | ORAL_TABLET | Freq: Every day | ORAL | 0 refills | Status: AC

## 2023-03-07 MED ORDER — OSMOLITE 1.5 CAL PO LIQD: 237.0000 mL | Freq: Every day | ORAL | 0 refills | Status: DC

## 2023-03-07 MED ORDER — PROSOURCE TF20 ENFIT COMPATIBL EN LIQD: 60.0000 mL | Freq: Two times a day (BID) | ENTERAL | 0 refills | Status: AC

## 2023-03-07 MED ORDER — FREE WATER: 150.0000 mL | Freq: Every day | Status: DC

## 2023-03-07 MED ORDER — FREE WATER: 135.0000 mL | 0 refills | Status: AC

## 2023-03-07 MED ORDER — OSMOLITE 1.5 CAL PO LIQD
237.0000 mL | Freq: Every day | ORAL | Status: DC
  Administered 2023-03-07 (×2): 237 mL
  Filled 2023-03-07 (×5): qty 237

## 2023-03-07 MED ORDER — FREE WATER
150.0000 mL | Freq: Every day | Status: DC
  Administered 2023-03-07 (×2): 150 mL

## 2023-03-07 MED ORDER — CEFADROXIL 500 MG PO CAPS: 500.0000 mg | ORAL_CAPSULE | Freq: Two times a day (BID) | ORAL | 0 refills | Status: AC

## 2023-03-07 MED ORDER — JUVEN PO PACK: 1.0000 | PACK | Freq: Two times a day (BID) | ORAL | 0 refills | Status: AC

## 2023-03-07 MED ORDER — OSMOLITE 1.5 CAL PO LIQD: 1000.0000 mL | ORAL | 0 refills | Status: AC

## 2023-03-07 NOTE — TOC Transition Note (Addendum)
Transition of Care [facility]) - CM/SW Discharge Note   Patient Details  Name: [name] MRN: [id number] Date of Birth: 06-Mar-1941  [facility] CM/SW Contact:  Janae Bridgeman, RN Phone Number: 03/07/2023, 2:51 PM   Clinical Narrative:    CM called and spoke with the patient's son by phone and he was made aware that patient's tube feed formula was changed.  Updated orders and DME note was placed to be signed by MD. The patient is active with [facility] for tube feeding supplies - Mitchel, CM with [employer] was notified to expedite shipping to the home.  I asked pharmacy to send extra formula home with the patient.  Nursing tech to pick up from main phacacy and will send home with the patient by PTAR.  MD to sign the DNR.  I called [facility] and requested RN visit at the home to re-enforce teaching regarding tube feedings and formula change.  Bedside nursing will call the patient's son and will provide discharge teaching by phone.    PTAR is arranged for 5 pm - packet at secretary's desk.  I called the son and he is aware of the PTAR home at 5 pm and [facility] to meet with the patient tomorrow.   Final next level of care: Home w Home Health Services Barriers to Discharge: Continued Medical Work up   Patient Goals and CMS Choice CMS Medicare.gov Compare Post Acute Care list provided to:: Patient Represenative (must comment) (Patient's son) Choice offered to / list presented to : Adult Children  Discharge Placement                         Discharge Plan and Services Additional resources added to the After Visit Summary for     Discharge Planning Services: CM Consult Post Acute Care Choice: Resumption of Svcs/PTA Provider, Home Health                    HH Arranged: RN, Nurse's Aide, Social Work [employer] Agency: [facility] Date HH Agency Contacted: 03/03/23 Time HH Agency Contacted: 1055 Representative spoke with at [facility] Agency:  Morrie Sheldon, CM with [facility] - active with [facility] RN - adding aide, MSW  Social Determinants of Health (SDOH) Interventions SDOH Screenings   Food Insecurity: No Food Insecurity (03/03/2023)  Housing: Patient Unable To Answer (01/28/2023)  Transportation Needs: No Transportation Needs (03/03/2023)  Utilities: Not At Risk (03/03/2023)  Tobacco Use: Medium Risk (03/02/2023)     Readmission Risk Interventions    03/03/2023   10:56 AM 01/30/2023    3:46 PM 01/27/2023    1:49 PM  Readmission Risk Prevention Plan  Transportation Screening Complete  Complete  PCP or Specialist Appt within 5-7 Days   Complete  PCP or Specialist Appt within 3-5 Days Complete Complete   Home Care Screening   Complete  Medication Review (RN CM)   Complete  HRI or Home Care Consult Complete Complete   Social Work Consult for Recovery Care Planning/Counseling Complete Complete   Palliative Care Screening Complete Complete   Medication Review [employer]) Complete Complete

## 2023-03-07 NOTE — Plan of Care (Signed)

## 2023-03-07 NOTE — Plan of Care (Signed)
RN performed discharge instruction education with pt son via phone, RN spent 30 minutes going over discharge instructions, medications, tube feeding education with pt son, all questions answered.

## 2023-03-07 NOTE — Progress Notes (Addendum)
    Durable Medical Equipment  (From admission, onward)           Start     Ordered   03/07/23 1441  For home use only DME Tube feeding  Once       Comments: Addendum: Pt and family are not wanting to discharge on bolus feeds. RD will update orders to the following regimen:  - Osmolite 1.5 @ 65 mL/hr (1560 mL)  - This will provide 2340 kcals, 98 gm protein and 1190 mL free water.  - Add FWF of 135 mL Q4H.    Modify EN regimen to:  - 6 cans of Osmolite 1.5 per day + TF20 PS BID.  - This will provide (1422 mL) 2293 kcals, 130 gm protein, 1086 mL free water.  - Please flush with 75 mL before and after each can. This will provide [name] total of 1986 mL free water with TF regimen.    - Continue -1 packet Juven BID, each packet provides 95 calories, 2.5 grams of protein (collagen), and 9.8 grams of carbohydrate (3 grams sugar); also contains 7 grams of L-arginine and L-glutamine, 300 mg vitamin C, 15 mg vitamin E, 1.2 mcg vitamin B-12, 9.5 mg zinc, 200 mg calcium, and 1.5 g  Calcium Beta-hydroxy-Beta-methylbutyrate to support wound healing   03/07/23 1441   03/07/23 1358  For home use only DME Tube feeding  Once       Comments: Modify EN regimen to:  - 6 cans of Osmolite 1.5 per day + TF20 PS BID.  - This will provide (1422 mL) 2293 kcals, 130 gm protein, 1086 mL free water.  - Please flush with 75 mL before and after each can. This will provide [name] total of 1986 mL free water with TF regimen.    - Continue -1 packet Juven BID, each packet provides 95 calories, 2.5 grams of protein (collagen), and 9.8 grams of carbohydrate (3 grams sugar); also contains 7 grams of L-arginine and L-glutamine, 300 mg vitamin C, 15 mg vitamin E, 1.2 mcg vitamin B-12, 9.5 mg zinc, 200 mg calcium, and 1.5 g  Calcium Beta-hydroxy-Beta-methylbutyrate to support wound healing   03/07/23 1408

## 2023-03-07 NOTE — Evaluation (Signed)
Clinical/Bedside Swallow Evaluation Patient Details  Name: [name] MRN: [id number] Date of Birth: 06-08-1941  Today's Date: 03/07/2023 Time: SLP Start Time (ACUTE ONLY): 1314 SLP Stop Time (ACUTE ONLY): 1328 SLP Time Calculation (min) (ACUTE ONLY): 14 min  Past Medical History:  Past Medical History:  Diagnosis Date   Arthritis    Asthma    Chronic back pain    Depression    History of pulmonary embolus (PE)    Hx-TIA (transient ischemic attack)    Hyperlipemia    Hypertension    Personal history of DVT (deep vein thrombosis)    Scoliosis    Sleep apnea    cpap - settingsi at 2    Stroke [facility])    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   Past Surgical History:  Past Surgical History:  Procedure Laterality Date   ARTHROSCOPIC REPAIR ACL  02/03/2004   HARDWARE REMOVAL N/A 07/27/2020   Procedure: Lumbar Hardware removal with lumbar wound debridement;  Surgeon: Barnett Abu, MD;  Location: [facility] OR;  Service: Neurosurgery;  Laterality: N/A;   IR GASTROSTOMY TUBE MOD SED  10/30/2020   IR IVC FILTER PLMT / S&I /IMG GUID/MOD SED  05/10/2020   IR REPLACE G-TUBE SIMPLE WO FLUORO  01/29/2023   LEFT HEART CATH AND CORONARY ANGIOGRAPHY N/A 09/16/2019   Procedure: LEFT HEART CATH AND CORONARY ANGIOGRAPHY;  Surgeon: Yvonne Kendall, MD;  Location: [facility] INVASIVE CV LAB;  Service: Cardiovascular;  Laterality: N/A;   LOOP RECORDER INSERTION N/A 02/21/2020   Procedure: LOOP RECORDER INSERTION;  Surgeon: Duke Salvia, MD;  Location: [facility] INVASIVE CV LAB;  Service: Cardiovascular;  Laterality: N/A;   LUMBAR WOUND DEBRIDEMENT N/A 07/27/2020   Procedure: LUMBAR WOUND DEBRIDEMENT;  Surgeon: Barnett Abu, MD;  Location: [facility] OR;  Service: Neurosurgery;  Laterality: N/A;   OOPHORECTOMY  1970   SPINE SURGERY  03/31/2006   TOTAL KNEE ARTHROPLASTY Left 05/17/2016   Procedure: LEFT TOTAL KNEE ARTHROPLASTY;  Surgeon: Kathryne Hitch, MD;  Location: [facility] ORS;  Service: Orthopedics;  Laterality: Left;   Adductor Block; Spinal to General   TUBAL LIGATION  1983   HPI:  Pt is an 82 yo female presenting 7/21 with AMS suspected to be due to medications and UTI. PMH includes: HTN, A-fib on Eliquis, DVT and PE on Eliquis, prior stroke, bedbound, sacral stage II ulcer, anxiety, dysphagia, has PEG tube. SLP has evaluated pt in the past with mild oral dysphagia noted and Dys 3 diet and thin liquids most recently recommended. PEG was placed due to inadequate oral intake.    Assessment / Plan / Recommendation  Clinical Impression  Pt's current presentation appears to be consistent with what is documented to be her baseline, including prolonged oral preparation both with purees and solids. She has no overt s/s of aspiration. She describes eating any foods she wants at home, but that it does need to be cut into bite-sized pieces by her family. Recommend starting Dys 3 diet and thin liquids as she has previously had while inpatient. No further SLP f/u indicated at this time. SLP Visit Diagnosis: Dysphagia, unspecified (R13.10)    Aspiration Risk  Mild aspiration risk    Diet Recommendation Dysphagia 3 (Mech soft);Thin liquid    Liquid Administration via: Cup;Straw Medication Administration: Whole meds with puree (or per tube) Supervision: Staff to assist with self feeding Compensations: Minimize environmental distractions;Small sips/bites Postural Changes: Seated upright at 90 degrees    Other  Recommendations Oral Care Recommendations: Oral care BID  Recommendations for follow up therapy are one component of a multi-disciplinary discharge planning process, led by the attending physician.  Recommendations may be updated based on patient status, additional functional criteria and insurance authorization.  Follow up Recommendations No SLP follow up      Assistance Recommended at Discharge    Functional Status Assessment Patient has not had a recent decline in their functional status  Frequency and  Duration            Prognosis        Swallow Study   General HPI: Pt is an 82 yo female presenting 7/21 with AMS suspected to be due to medications and UTI. PMH includes: HTN, A-fib on Eliquis, DVT and PE on Eliquis, prior stroke, bedbound, sacral stage II ulcer, anxiety, dysphagia, has PEG tube. SLP has evaluated pt in the past with mild oral dysphagia noted and Dys 3 diet and thin liquids most recently recommended. PEG was placed due to inadequate oral intake. Type of Study: Bedside Swallow Evaluation Previous Swallow Assessment: see HPI Diet Prior to this Study: NPO Temperature Spikes Noted: No Respiratory Status: Room air History of Recent Intubation: No Behavior/Cognition: Alert;Cooperative;Pleasant mood Oral Cavity Assessment: Within Functional Limits Oral Care Completed by SLP: No Oral Cavity - Dentition: Missing dentition (missing upper dentition, does not wear dentures) Vision: Functional for self-feeding Self-Feeding Abilities: Total assist Patient Positioning: Upright in bed Baseline Vocal Quality: Normal Volitional Swallow: Able to elicit    Oral/Motor/Sensory Function Overall Oral Motor/Sensory Function:  (mild L facial weakness, consistent with h/o CVA and prior documentation)   Ice Chips Ice chips: Not tested   Thin Liquid Thin Liquid: Within functional limits Presentation: Straw    Nectar Thick Nectar Thick Liquid: Not tested   Honey Thick Honey Thick Liquid: Not tested   Puree Puree: Impaired Presentation: Spoon Oral Phase Functional Implications: Prolonged oral transit   Solid     Solid: Impaired Oral Phase Functional Implications: Prolonged oral transit      Mahala Menghini., M.A. CCC-SLP Acute Rehabilitation Services Office [phone]  Secure chat preferred  03/07/2023,2:40 PM

## 2023-03-07 NOTE — Discharge Summary (Addendum)
Physician Discharge Summary  [name] VQQ:[id number] DOB: 07-11-1941 DOA: 03/02/2023  PCP: [name], MD  Admit date: 03/02/2023 Discharge date: 03/07/2023  Time spent: 40 minutes  Recommendations for Outpatient Follow-up:  Follow outpatient CBC/CMP  Follow with outpatient providers Follow tube feeding regimen outpatient  Follow blood pressure outpatient Follow wound care outpatient  Caution with pain meds in future  Discharge Diagnoses:  Principal Problem:   Acute metabolic encephalopathy Active Problems:   History of CVA (cerebrovascular accident)   Hyperlipidemia   Essential hypertension   Dementia without behavioral disturbance (HCC)   Acute cystitis   Hyperammonemia (HCC)   Atrial fibrillation, chronic (HCC)   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 Patient takes tramadol chronically and her son found her confused. Patient's son called EMS stating that she was unresponsive, had given [name] dose of narcan and patient became more alert. Son also reported that she has been getting weaker over the last few days. Work up is positive for UTI. She's now on oral antibiotics.   She's improved with antibiotics and holding opiates.   Stable for discharge 7/26.  Hospital Course:  Assessment and Plan:  Acute metabolic encephalopathy Suspected due to medications (oxycodone) and UTI  Patient on oxycodone as needed at home, became lethargic was given [name] dose of Narcan on admission and  patient's mental status improved. CT of the head without contrast does not show any acute intracranial abnormalities. Treating for UTI  Seems to be back to baseline   Proteus Mirabilis Urinary tract infection Narrow to duricef   Chronic Pain Need to reconsider pain meds at discharge with confusion at presentation hasn't recently received any opiates and doing fine - my recommendation would be to hold at discharge and if needed resumption to start low   History of CVA;  Continue with Eliquis and Lipitor. Patient is on tube feeds   Hypertensive Urgency:  Continue with lisinopril 20 mg daily and Norvasc 5 mg daily.  BP ok today   Dementia without behavioral disturbance, anxiety Continue with BuSpar and memantine.   Chronic atrial fibrillation Continue with Eliquis for anticoagulation Rate well-controlled.   History of pulmonary embolism and DVT Continue with Eliquis   Hyperammonemia Appears to have resolved   Dysphagia  Tube Feed Dependent Dysphagia 3, thin liquids  Per RD note "Pt and family are not wanting to discharge on bolus feeds. RD will update orders to the following regimen:  - Osmolite 1.5 @ 65 mL/hr (1560 mL)  - This will provide 2340 kcals, 98 gm protein and 1190 mL free water.  - Add FWF of 135 mL Q4H."  Pressure Ulcer  Pressure Injury 10/19/20 Vertebral column Left;Mid Unstageable - Full thickness tissue loss in which the base of the injury is covered by slough (yellow, tan, gray, green or brown) and/or eschar (tan, brown or black) in the wound bed. eschar d/t removal  (Active)  10/19/20 2020  Location: Vertebral column  Location Orientation: Left;Mid  Staging: Unstageable -  Full thickness tissue loss in which the base of the injury is covered by slough (yellow, tan, gray, green or brown) and/or eschar (tan, brown or black) in the wound bed.  Wound Description (Comments): eschar d/t removal of medical equipment which became infected (6x5)  Present on  Admission: Yes     Pressure Injury 03/03/23 Sacrum Mid Stage 2 -  Partial thickness loss of dermis presenting as [name] shallow open injury with [name] red, pink wound bed without slough. (Active)  03/03/23   Location: Sacrum  Location Orientation: Mid  Staging: Stage 2 -  Partial thickness loss of dermis presenting as [name] shallow open injury with [name] red, pink wound bed without slough.  Wound Description (Comments):   Present on Admission: Yes     Pressure Injury Buttocks Left Stage 2 -  Partial thickness loss of dermis presenting as [name] shallow open injury with [name] red, pink wound bed without slough. (Active)     Location: Buttocks  Location Orientation: Left  Staging: Stage 2 -  Partial thickness loss of dermis presenting as [name] shallow open injury with [name] red, pink wound bed without slough.  Wound Description (Comments):   Present on Admission: Yes  Continue wound care     Procedures: none   Consultations: none  Discharge Exam: Vitals:   03/07/23 0358 03/07/23 0724  BP: (!) 144/62 (!) 143/58  Pulse: 72 64  Resp: 18   Temp: 98.2 F (36.8 C) 98 F (36.7 C)  SpO2: (!) 80% 100%   No complaints Discussed discharge with son  General: No acute distress. Cardiovascular: RRR Lungs: unlabored Abdomen: Soft, nontender, nondistended - g tube Neurological: alert, moving all extremities Extremities: no LEE  Discharge Instructions   Discharge Instructions     Call MD for:  difficulty breathing, headache or visual disturbances   Complete by: As directed    Call MD for:  extreme fatigue   Complete by: As directed    Call MD for:  hives   Complete by: As directed    Call MD for:  persistant dizziness or light-headedness   Complete by: As directed    Call MD for:  persistant nausea and vomiting   Complete by: As directed    Call MD for:  redness, tenderness, or signs of infection (pain, swelling, redness, odor or green/yellow discharge around incision site)   Complete by: As directed     Call MD for:  severe uncontrolled pain   Complete by: As directed    Call MD for:  temperature >100.4   Complete by: As directed    Diet - low sodium heart healthy   Complete by: As directed    Discharge instructions   Complete by: As directed    You were seen for confusion and altered mental status.  I believe this was related to your oxycodone and [name] UTI.  We've stopped your oxycodone and you're currently doing well off this.  If you need oxycodone to be resumed, I'd recommend restarting at [name] lower dose.  We'll send you home with another couple of days of antibiotics.  Continue wound care for the pressure ulcers as prescribed.    Return for new, recurrent, or worsening symptoms.  Please ask your PCP to request records from this hospitalization so they know what was done and what the next steps will be.   Discharge wound care:   Complete by: As directed    1. Apply foam dressings to sacrum/bilateral buttocks/bilateral lower buttocks creases. Change  every 3 days or as needed for soiling 2. Apply Desitin around G tube site twice [name] day and as needed if leaking, then cover with split thickness gauze.   Increase activity slowly   Complete by: As directed       Allergies as of 03/07/2023       Reactions   T-pa [alteplase] Swelling, Other (See Comments)   Does okay with it if pre-medicated with benadryl; limbs became swollen   Penicillins Other (See Comments)   Unknown    Codeine Itching        Medication List     STOP taking these medications    Oxycodone HCl 20 MG Tabs   traMADol 50 MG tablet Commonly known as: ULTRAM       TAKE these medications    acetaminophen 500 MG tablet Commonly known as: TYLENOL Take 1,000 mg by mouth every 6 (six) hours as needed for moderate pain or fever.   albuterol 108 (90 Base) MCG/ACT inhaler Commonly known as: VENTOLIN HFA Inhale 2 puffs into the lungs every 6 (six) hours as needed for wheezing or shortness of breath.    amLODipine 5 MG tablet Commonly known as: NORVASC Take 1 tablet (5 mg total) by mouth daily. Start taking on: March 08, 2023   ascorbic acid 500 MG tablet Commonly known as: VITAMIN C Take 500 mg by mouth daily.   atorvastatin 20 MG tablet Commonly known as: LIPITOR Take 20 mg by mouth at bedtime.   BIOTIN PO Take 1 capsule by mouth daily.   busPIRone 30 MG tablet Commonly known as: BUSPAR Take 15 mg by mouth 2 (two) times daily.   butalbital-acetaminophen-caffeine 50-325-40 MG tablet Commonly known as: FIORICET Take 0.5 tablets by mouth as needed for headache.   cefadroxil 500 MG capsule Commonly known as: DURICEF Take 1 capsule (500 mg total) by mouth 2 (two) times daily for 2 days.   Centrum Silver 50+Women Tabs Take 1 tablet by mouth in the morning.   cholecalciferol 25 MCG (1000 UNIT) tablet Commonly known as: VITAMIN D3 Take 1,000 Units by mouth daily.   clotrimazole-betamethasone cream Commonly known as: LOTRISONE Apply 1 Application topically 2 (two) times daily. Apply to buttocks   docusate sodium 100 MG capsule Commonly known as: COLACE Take 200 mg by mouth at bedtime.   Eliquis 5 MG Tabs tablet Generic drug: apixaban Take 5 mg by mouth in the morning and at bedtime.   feeding supplement (PROSource TF20) liquid Place 60 mLs into feeding tube 2 (two) times daily.   ferrous sulfate 325 (65 FE) MG tablet Take 1 tablet (325 mg total) by mouth daily with breakfast. What changed:  when to take this additional instructions   FLEET LAXATIVE PO Take 1 Dose by mouth as needed (constipation).   bisacodyl 10 MG suppository Commonly known as: DULCOLAX Place 10 mg rectally as needed for moderate constipation.   fluticasone 50 MCG/ACT nasal spray Commonly known as: FLONASE Place 2 sprays into both nostrils daily as needed for allergies or rhinitis.   free water Soln Place 150 mLs into feeding tube 6 (six) times daily.   furosemide 20 MG  tablet Commonly known as: LASIX Take 10 mg by mouth daily as needed for fluid or edema.   gentamicin cream 0.1 % Commonly known as: GARAMYCIN Apply 1 Application topically See admin instructions. Apply around the feeding tube with every change and every morning with wound dressing change   linaclotide 290 MCG Caps capsule Commonly known  as: LINZESS Take 290 mcg by mouth daily before breakfast.   lisinopril 10 MG tablet Commonly known as: ZESTRIL Take 2 tablets (20 mg total) by mouth daily. What changed: how much to take   memantine 28 MG Cp24 24 hr capsule Commonly known as: NAMENDA XR Take 1 capsule (28 mg total) by mouth daily.   nitroGLYCERIN 0.4 MG SL tablet Commonly known as: NITROSTAT Place 1 tablet (0.4 mg total) under the tongue every 5 (five) minutes as needed for chest pain.   nutrition supplement (JUVEN) Pack Place 1 packet into feeding tube 2 (two) times daily between meals. What changed:  when to take this Another medication with the same name was removed. Continue taking this medication, and follow the directions you see here.   feeding supplement (OSMOLITE 1.5 CAL) Liqd Place 237 mLs into feeding tube 6 (six) times daily. Flush with 75 cc free water before and after. What changed:  how to take this when to take this additional instructions Another medication with the same name was removed. Continue taking this medication, and follow the directions you see here.   nystatin powder Apply 1 Application topically See admin instructions. Apply to buttocks 2 times Katurah Karapetian day (after applying clotrimazole-betamethasone cream)   OVER THE COUNTER MEDICATION 1 patch See admin instructions. Calcium Alginate with Antimicrobial Silver Sheet Dressing- Change dressing every morning (left "tunnel")   potassium chloride 10 MEQ tablet Commonly known as: KLOR-CON Take 5 mEq by mouth See admin instructions. Take 5 mEq by mouth once Kaizer Dissinger day only when taking Furosemide   Restasis  0.05 % ophthalmic emulsion Generic drug: cycloSPORINE Place 1 drop into both eyes daily as needed (dry eyes). What changed: reasons to take this   zinc gluconate 50 MG tablet Take 50 mg by mouth daily.               Discharge Care Instructions  (From admission, onward)           Start     Ordered   03/07/23 0000  Discharge wound care:       Comments: 1. Apply foam dressings to sacrum/bilateral buttocks/bilateral lower buttocks creases. Change every 3 days or as needed for soiling 2. Apply Desitin around G tube site twice Lorik Guo day and as needed if leaking, then cover with split thickness gauze.   03/07/23 1355           Allergies  Allergen Reactions   T-Pa [Alteplase] Swelling and Other (See Comments)    Does okay with it if pre-medicated with benadryl; limbs became swollen   Penicillins Other (See Comments)    Unknown    Codeine Itching    Follow-up Information     [place] Follow up.   Why: [facility] will [facility] to provide [facility] health services. Contact information: [address] [place] [address] [phone]         [name], MD Follow up.   Specialty: Internal Medicine Contact information: [address] [place] [address] [phone]                  The results of significant diagnostics from this hospitalization (including imaging, microbiology, ancillary and laboratory) are listed below for reference.    Significant Diagnostic Studies: DG Abd 2 Views  Result Date: 03/03/2023 CLINICAL DATA:  Nausea, vomiting. EXAM: ABDOMEN - 2 VIEW COMPARISON:  February 04, 2022. FINDINGS: The bowel gas pattern is normal. There is no evidence of  free air. Gastrostomy tube is again noted in left upper quadrant. IVC filter is noted. IMPRESSION: No abnormal bowel dilatation. Aortic Atherosclerosis (ICD10-I70.0). Electronically Signed   By: [name] M.D.   On: 03/03/2023 17:44   DG Chest  Portable 1 View  Result Date: 03/02/2023 CLINICAL DATA:  Altered mental status. EXAM: PORTABLE CHEST 1 VIEW COMPARISON:  Chest radiograph and CT 01/26/2023 FINDINGS: The cardiomediastinal silhouette is unchanged with normal heart size. Tortuosity and atherosclerotic calcification are noted of the thoracic aorta. Lung volumes are low with mild streaky opacities in the lung bases favored to reflect atelectasis. The interstitial markings are diffusely increased bilaterally with [name] slightly nodular appearance in the mid to lower lungs. Curvilinear lucency projecting over the left upper hemithorax likely reflects [name] skin fold. No definite pneumothorax or sizable pleural effusion is identified. Degenerative changes are noted at the shoulders. IMPRESSION: Low lung volumes with bibasilar atelectasis and diffusely increased interstitial markings which could reflect atypical infection or mild interstitial edema. Electronically Signed   By: [name] M.D.   On: 03/02/2023 15:54   CT Head Wo Contrast  Result Date: 03/02/2023 CLINICAL DATA:  Mental status change, unknown cause. EXAM: CT HEAD WITHOUT CONTRAST TECHNIQUE: Contiguous axial images were obtained from the base of the skull through the vertex without intravenous contrast. RADIATION DOSE REDUCTION: This exam was performed according to the departmental dose-optimization program which includes automated exposure control, adjustment of the mA and/or kV according to patient size and/or use of iterative reconstruction technique. COMPARISON:  Head CT 01/27/2023 FINDINGS: Brain: There is no evidence of an acute infarct, intracranial hemorrhage, mass, midline shift, or extra-axial fluid collection. Mild cerebral atrophy is within normal limits for age. Cerebral white matter hypodensities are unchanged and nonspecific but compatible with mild chronic small vessel ischemic disease. Selah Klang small chronic infarct in the superior left cerebellar hemisphere is unchanged. Vascular:  Calcified atherosclerosis at the skull base. No hyperdense vessel. Skull: No acute fracture or suspicious osseous lesion. Sinuses/Orbits: Visualized paranasal sinuses and mastoid air cells are clear. Bilateral cataract extraction. Other: None. IMPRESSION: 1. No evidence of acute intracranial abnormality. 2. Mild chronic small vessel ischemic disease. Electronically Signed   By: [name] M.D.   On: 03/02/2023 15:50    Microbiology: Recent Results (from the past 240 hour(s))  Urine Culture     Status: Abnormal   Collection Time: 03/02/23 11:21 PM   Specimen: Urine, Clean Catch  Result Value Ref Range Status   Specimen Description URINE, CLEAN CATCH  Final   Special Requests   Final    ADDED 2321 Performed at [facility], [address] [place], [place] [address]    Culture >=100,000 COLONIES/mL PROTEUS MIRABILIS (Claudina Oliphant)  Final   Report Status 03/04/2023 FINAL  Final   Organism ID, Bacteria PROTEUS MIRABILIS (Aysa Larivee)  Final      Susceptibility   Proteus mirabilis - MIC*    AMPICILLIN <=2 SENSITIVE Sensitive     CEFAZOLIN <=4 SENSITIVE Sensitive     CEFEPIME <=0.12 SENSITIVE Sensitive     CEFTRIAXONE <=0.25 SENSITIVE Sensitive     CIPROFLOXACIN <=0.25 SENSITIVE Sensitive     GENTAMICIN <=1 SENSITIVE Sensitive     IMIPENEM 2 SENSITIVE Sensitive     NITROFURANTOIN 128 RESISTANT Resistant     TRIMETH/SULFA <=20 SENSITIVE Sensitive     AMPICILLIN/SULBACTAM <=2 SENSITIVE Sensitive     PIP/TAZO <=4 SENSITIVE Sensitive     * >=100,000 COLONIES/mL PROTEUS MIRABILIS  Labs: Basic Metabolic Panel: Recent Labs  Lab 03/02/23 1423 03/02/23 1441 03/02/23 1442 03/03/23 1115 03/05/23 2337 03/07/23 0207  NA 138 142 143 139 139 141  K 3.9 3.8 3.8 3.9 3.6 3.9  CL 103 104  --  104 106 103  CO2 23  --   --  22 27 31   GLUCOSE 131* 130*  --  107* 163* 117*  BUN 19 27*  --  11 28* 36*  CREATININE 0.53 0.40*  --  0.46 0.47 0.50  CALCIUM 8.8*  --   --  9.1 8.6* 8.9  MG 2.2  --   --   --  2.0  2.2  PHOS  --   --   --   --  1.7* 2.8   Liver Function Tests: Recent Labs  Lab 03/02/23 1423 03/03/23 1115 03/05/23 2337 03/07/23 0207  AST 48* 35 20 34  ALT 28 23 20 31   ALKPHOS 61 63 43 46  BILITOT 1.3* 0.9 0.8 0.5  PROT 6.5 6.8 5.6* 5.9*  ALBUMIN 2.9* 2.9* 2.4* 2.6*   No results for input(s): "LIPASE", "AMYLASE" in the last 168 hours. Recent Labs  Lab 03/02/23 1402 03/03/23 0914  AMMONIA 40* 32   CBC: Recent Labs  Lab 03/02/23 1423 03/02/23 1441 03/02/23 1442 03/03/23 0914 03/05/23 2337 03/07/23 0207  WBC 12.0*  --   --  11.9* 7.6 8.2  NEUTROABS 10.3*  --   --   --  5.1 5.4  HGB 14.8 16.0* 16.0* 15.1* 13.1 14.2  HCT 46.8* 47.0* 47.0* 48.9* 40.4 44.7  MCV 104.5*  --   --  105.4* 105.5* 105.2*  PLT 91*  --   --  85* 92* 86*   Cardiac Enzymes: No results for input(s): "CKTOTAL", "CKMB", "CKMBINDEX", "TROPONINI" in the last 168 hours. BNP: BNP (last 3 results) No results for input(s): "BNP" in the last 8760 hours.  ProBNP (last 3 results) No results for input(s): "PROBNP" in the last 8760 hours.  CBG: Recent Labs  Lab 03/06/23 1603 03/07/23 0059 03/07/23 0607 03/07/23 0732 03/07/23 1222  GLUCAP 114* 123* 115* 161* 89       Signed:  Lacretia Nicks MD.  [facility] 03/07/2023, 2:02 PM

## 2023-03-07 NOTE — Progress Notes (Addendum)
Nutrition Follow-up  DOCUMENTATION CODES:   Not applicable  INTERVENTION:   Addendum: Pt and family are not wanting to discharge on bolus feeds. RD will update orders to the following regimen:  - Osmolite 1.5 @ 65 mL/hr (1560 mL)  - This will provide 2340 kcals, 98 gm protein and 1190 mL free water.  - Add FWF of 135 mL Q4H.   Modify EN regimen to:  - 6 cans of Osmolite 1.5 per day + TF20 PS BID.  - This will provide (1422 mL) 2293 kcals, 130 gm protein, 1086 mL free water.  - Please flush with 75 mL before and after each can. This will provide a total of 1986 mL free water with TF regimen.   - Continue -1 packet Juven BID, each packet provides 95 calories, 2.5 grams of protein (collagen), and 9.8 grams of carbohydrate (3 grams sugar); also contains 7 grams of L-arginine and L-glutamine, 300 mg vitamin C, 15 mg vitamin E, 1.2 mcg vitamin B-12, 9.5 mg zinc, 200 mg calcium, and 1.5 g  Calcium Beta-hydroxy-Beta-methylbutyrate to support wound healing  NUTRITION DIAGNOSIS:   Increased nutrient needs related to wound healing as evidenced by estimated needs.  GOAL:   Patient will meet greater than or equal to 90% of their needs  MONITOR:   TF tolerance  REASON FOR ASSESSMENT:   Consult Enteral/tube feeding initiation and management  ASSESSMENT:   82 y.o. female admits related to AMS. PMH includes: arthritis, asthma, TIA, HLD, HTN, stroke. Pt is currently receiving medical management related to acute cystitis.  Meds reviewed: lipitor, linzess, MVI, Juven BID, K phos, miralax. Labs reviewed: BUN elevated.   Pt is currently receiving continuous feeds of Osmolite 1.2 at goal rate and tolerating well with no issues. Pt was documented to have a Type 7 BM yesterday. Pt does have scheduled Miralax ordered and was started on antibiotics. RD will message MD about stopping scheduled miralax. Pt remains NPO. RD will modify over to bolus regimen to prepare for discharge and make sure she  tolerates the new schedule well. Will continue to monitor PO intakes.   Diet Order:   Diet Order             Diet NPO time specified Except for: Sips with Meds  Diet effective now                   EDUCATION NEEDS:   Not appropriate for education at this time  Skin:  Skin Assessment: Skin Integrity Issues: Skin Integrity Issues:: Stage II Stage II: Decubitus ulcer  Last BM:  7/25 - type 7  Height:   Ht Readings from Last 1 Encounters:  03/03/23 5\' 7"  (1.702 m)    Weight:   Wt Readings from Last 1 Encounters:  03/07/23 77.1 kg    Ideal Body Weight:     BMI:  Body mass index is 26.61 kg/m.  Estimated Nutritional Needs:   Kcal:  2000-2400 kcals  Protein:  100-120 gm  Fluid:  >/= 2 L  Bethann Humble, RD, LDN, CNSC.

## 2023-03-07 NOTE — Plan of Care (Signed)

## 2023-03-07 NOTE — Progress Notes (Signed)
    Durable Medical Equipment  (From admission, onward)           Start     Ordered   03/07/23 1358  For home use only DME Tube feeding  Once       Comments: Modify EN regimen to:  - 6 cans of Osmolite 1.5 per day + TF20 PS BID.  - This will provide (1422 mL) 2293 kcals, 130 gm protein, 1086 mL free water.  - Please flush with 75 mL before and after each can. This will provide a total of 1986 mL free water with TF regimen.    - Continue -1 packet Juven BID, each packet provides 95 calories, 2.5 grams of protein (collagen), and 9.8 grams of carbohydrate (3 grams sugar); also contains 7 grams of L-arginine and L-glutamine, 300 mg vitamin C, 15 mg vitamin E, 1.2 mcg vitamin B-12, 9.5 mg zinc, 200 mg calcium, and 1.5 g  Calcium Beta-hydroxy-Beta-methylbutyrate to support wound healing   03/07/23 1408

## 2023-03-21 ENCOUNTER — Encounter (HOSPITAL_BASED_OUTPATIENT_CLINIC_OR_DEPARTMENT_OTHER): Payer: Medicare Other | Admitting: General Surgery

## 2023-04-23 ENCOUNTER — Encounter (HOSPITAL_BASED_OUTPATIENT_CLINIC_OR_DEPARTMENT_OTHER): Payer: Medicare Other | Attending: General Surgery | Admitting: General Surgery

## 2023-04-23 DIAGNOSIS — Z86718 Personal history of other venous thrombosis and embolism: Secondary | ICD-10-CM | POA: Insufficient documentation

## 2023-04-23 DIAGNOSIS — L97522 Non-pressure chronic ulcer of other part of left foot with fat layer exposed: Secondary | ICD-10-CM | POA: Diagnosis not present

## 2023-04-23 DIAGNOSIS — I428 Other cardiomyopathies: Secondary | ICD-10-CM | POA: Diagnosis not present

## 2023-04-23 DIAGNOSIS — L89103 Pressure ulcer of unspecified part of back, stage 3: Secondary | ICD-10-CM | POA: Insufficient documentation

## 2023-04-23 NOTE — Progress Notes (Addendum)
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 home. Photos were provided by Maralyn Sago. The ulcer on the patient's buttock has contracted considerably and is much shallower. The periwound purple discoloration has also decreased markedly. Minimal drainage reported from the wound. 09/18/2022: T elehealth visit with the patient  via her home health nurse Sarah. The case manager and I participated in the call from our office in the wound care center. The patient and her nurse were at the patient's home. Photos were provided by Maralyn Sago. The buttock ulcer is smaller again as of this visit. Maralyn Sago reports that the drainage is thinner and less copious. She has a couple of small purple marks adjacent to the wound that Maralyn Sago believes are secondary to tape irritation. 10/16/2022: Telehealth visit with the patient via her home health nurse Sarah. The case manager and I participated in the call from our office in the wound care center. The patient and her nurse were at the patient's home. Photos were provided by Maralyn Sago. The buttock ulcer is shallower, but there is a "squishy area" adjacent that Maralyn Sago concerned may break open. Maralyn Sago reports that the drainage is looking a little bit more purulent. The purple marks that were present last time are no longer there. [name], [name] ([id number]) [id number] Page 8 of 10 11/13/2022: Telehealth visit with the patient via her home health nurse Sarah. The case manager and I participated in the call from our office in the wound care center. The patient and her nurse were at the patient's home. Photos were provided by Maralyn Sago. A small pinpoint opening in the "squishy area" that Maralyn Sago was concerned about last visit did end up breaking open and is draining some seropurulent fluid. Otherwise there have been no significant changes. 01/01/2023: Telehealth visit  with the patient via her home health nurse Sarah. The case manager and I participated in the call from our office in the wound care center. The patient and her nurse were at the patient's home. Photos were provided by Maralyn Sago. The pinpoint area that had opened during our last visit has now closed again. The tunnel that has been getting packed is smaller and closing up. 02/12/2023: This is a telehealth visit with the patient via her  home health nurse Sarah. The case manager and I participated in the call from our office in the wound care center. The patient and her nurse were at the patient's home. Since our last visit, the patient was hospitalized with altered mental status and was found to be septic. Her wounds were not thought to be the source of sepsis, but rather some cellulitis in her arm. She responded well to antibiotics and is now home again. Maralyn Sago reports that the wound in her back reopened just a bit while she was in the hospital. We are still packing the tunnel on her buttock. 04/23/2023: This is a telehealth visit with the patient via her home health nurse Sarah. The case manager and I participated in the call from our office in the wound care center. The patient and her nurse were in the patient's home. Since our last visit, the buttock ulcer has healed. The wound on her back is down to just a small pinhole. She has a new wound on the knuckle of her left third toe. The patient's PCP had them applying Neosporin and said that the wound appeared to be closed, but based upon the photographs provided, there still appears to be an open area underneath a layer of dry eschar. Allergies codeine, alteplase Objective Constitutional Vital signs provided by the home health nurse.. Vitals Time Taken: 10:43 AM, Temperature: 98.2 F, Pulse: 74 bpm, Blood Pressure: 130/70 mmHg, Pulse Oximetry: 98 %. General Notes: 04/23/2023: Wounds reviewed via photographs provided by the patient's home health nurse. Since our last visit, the buttock ulcer has healed. The wound on her back is down to just a small pinhole. She has a new wound on the knuckle of her left third toe. The patient's PCP had them applying Neosporin and said that the wound appeared to be closed, but based upon the photographs provided, there still appears to be an open area underneath a layer of dry eschar. Integumentary (Hair, Skin) Wound #5 status is Open. Original cause  of wound was Gradually Appeared. The date acquired was: 09/12/2020. The wound has been in treatment 127 weeks. The wound is located on the Left Gluteus. The wound measures 0.2cm length x 0.2cm width x 0.2cm depth; 0.031cm^2 area and 0.006cm^3 volume. There is Fat Layer (Subcutaneous Tissue) exposed. There is no undermining noted. There is a small amount of serous drainage noted. The wound margin is epibole. There is large (67-100%) red, pink granulation within the wound bed. There is no necrotic tissue within the wound bed. The periwound skin appearance had no abnormalities noted for texture. The periwound skin appearance had no abnormalities noted for moisture. The periwound skin appearance had no abnormalities noted for color. Periwound temperature was noted as No Abnormality. Wound #7 status is Healed - Epithelialized. Original cause of wound was Pressure Injury. The date acquired was: 02/12/2023. The wound has been in treatment 10 weeks. The wound is located on the Back. The wound measures 0cm length x 0cm width x 0cm depth; 0cm^2 area and 0cm^3 volume. There is no  tunneling or undermining noted. There is a none present amount of drainage noted. There is no granulation within the wound bed. There is no necrotic tissue within the wound bed. The periwound skin appearance had no abnormalities noted for moisture. The periwound skin appearance had no abnormalities noted for color. The periwound skin appearance exhibited: Scarring. Wound #8 status is Open. Original cause of wound was Pressure Injury. The date acquired was: 04/15/2023. The wound is located on the Left T Second. The oe wound measures 0.3cm length x 0.3cm width x 0.1cm depth; 0.071cm^2 area and 0.007cm^3 volume. There is Fat Layer (Subcutaneous Tissue) exposed. There is no tunneling or undermining noted. There is a small amount of serosanguineous drainage noted. The wound margin is flat and intact. There is no granulation within the wound bed.  There is a large (67-100%) amount of necrotic tissue within the wound bed including Eschar. The periwound skin appearance had no abnormalities noted for texture. The periwound skin appearance had no abnormalities noted for moisture. The periwound skin appearance had no abnormalities noted for color. Periwound temperature was noted as No Abnormality. 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 - Telehealth Wed. 10/9 @ 10:30 am Bathing/ Shower/ Hygiene: May shower and wash wound with soap and water. [name], [name] ([id number]) [id number] Page 9 of 10 Off-Loading: Low air-loss mattress (Group 2) Turn and reposition every 2 hours Additional Orders / Instructions: Follow Nutritious Diet - Continue Juven and Nutrin Home Health: New wound care orders this week; continue Home Health for wound care. May utilize formulary equivalent dressing for wound treatment orders unless otherwise specified. Other Home Health Orders/Instructions: - [facility] for Wound Care 3x week. [place] [phone] WOUND #5: - Gluteus Wound Laterality: Left Cleanser: Wound Cleanser (Home Health) 3 x Per Week/30 Days Discharge Instructions: Cleanse the wound with wound cleanser prior to applying a clean dressing using gauze sponges, not tissue or cotton balls. Prim Dressing: Maxorb Extra Ag+ Alginate Dressing, 2x2 (in/in) 3 x Per Week/30 Days ary Discharge Instructions: Apply to wound bed as instructed Secondary Dressing: Zetuvit Plus Silicone Border Dressing 4x4 (in/in) (Home Health) 3 x Per Week/30 Days Discharge Instructions: Apply silicone border or abd pad with tape over primary dressing as directed. WOUND #8: - T Second Wound Laterality: Left oe Prim Dressing: MediHoney Gel, tube 1.5 (oz) 3 x Per  Week/30 Days ary Discharge Instructions: Apply to wound bed as instructed Secondary Dressing: Woven Gauze Sponges 2x2 in 3 x Per Week/30 Days Discharge Instructions: Apply over primary dressing as directed. Secured With: Insurance underwriter, Sterile 2x75 (in/in) 3 x Per Week/30 Days Discharge Instructions: Secure with stretch gauze as directed. 04/23/2023: Since our last visit, the buttock ulcer has healed. The wound on her back is down to just a small pinhole. She has a new wound on the knuckle of her left third toe. The patient's PCP had them applying Neosporin and said that the wound appeared to be closed, but based upon the photographs provided, there still appears to be an open area underneath a layer of dry eschar. The back wound is so small that we can no longer be packed. They have been applying silver alginate with a foam border dressing to the site and we will continue doing this. Obviously, I am unable to debride the toe ulcer given the virtual nature  of this visit. I have recommended that they apply Medihoney to try and soften the eschar to the point that it will come off with mechanical debridement with gauze. We will have another telehealth visit in 1 month, unless the home health nurse feels that she needs a sooner consultation. Electronic Signature(s) Unsigned Previous Signature: 04/23/2023 11:01:16 AM Version By: Duanne Guess MD FACS Entered By: Zenaida Deed on 04/23/2023 11:09:59 -------------------------------------------------------------------------------- SuperBill Details Patient Name: Date of Service: Sarah Hose D. 04/23/2023 Medical Record Number: [id number] Patient Account Number: [id number] Date of Birth/Sex: Treating RN: 1940/09/21 (82 y.o. 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

## 2023-04-24 NOTE — Progress Notes (Signed)
Cerullo, Sharin Grave ([id number]) [id number] Page 1 of 5 Visit Report for 04/23/2023 Allergy List Details Patient Name: Date of Service: [name], [name] 04/23/2023 10:30 A M Medical Record Number: [id number] Patient Account Number: [id number] Date of Birth/Sex: Treating RN: 04/03/1941 (82 y.o. [name] Primary Care Laira Penninger: [name] Other Clinician: Referring Darey Hershberger: Treating Rachelanne Whidby/Extender: Bud Face in Treatment: 127 Allergies Active Allergies codeine alteplase Allergy Notes Electronic Signature(s) Signed: 04/23/2023 5:11:25 PM By: Zenaida Deed RN, BSN Entered By: Zenaida Deed on 04/23/2023 10:52:27 -------------------------------------------------------------------------------- Patient/Caregiver Education Details Patient Name: Date of Service: [name] 9/11/2024andnbsp10:30 A M Medical Record Number: [id number] Patient Account Number: [id number] Date of Birth/Gender: Treating RN: 1941/04/19 (82 y.o. [name] Primary Care Physician: [name] Other Clinician: Referring Physician: Treating Physician/Extender: Bud Face in Treatment: 127 Education Assessment Education Provided To: Caregiver Education Topics Provided Nutrition: Methods: Explain/Verbal Responses: Reinforcements needed, State content correctly Wound/Skin Impairment: Methods: Explain/Verbal Responses: Reinforcements needed, State content correctly Electronic Signature(s) Signed: 04/23/2023 5:11:25 PM By: Zenaida Deed RN, BSN Entered By: Zenaida Deed on 04/23/2023 16:58:49 Saline, Sharin Grave ([id number]) [id number] Page 2 of 5 -------------------------------------------------------------------------------- Wound Assessment Details Patient Name: Date of Service: [name], [name] 04/23/2023 10:30 A M Medical Record Number: [id number] Patient  Account Number: [id number] Date of Birth/Sex: Treating RN: [birth year] (82 y.o. [name] Primary Care Amisadai Woodford: [name] Other Clinician: Referring Zuleyka Kloc: Treating Demarqus Jocson/Extender: Bud Face in Treatment: 127 Wound Status Wound Number: 5 Primary Etiology: Pressure Ulcer Wound Location: Left Gluteus Wound Status: Open Wounding Event: Gradually Appeared Comorbid History: Hypertension, Dementia Date Acquired: 09/12/2020 Weeks Of Treatment: 127 Clustered Wound: No Photos Wound Measurements Length: (cm) 0.2 Width: (cm) 0.2 Depth: (cm) 0.2 Area: (cm) 0.031 Volume: (cm) 0.006 % Reduction in Area: 98.7% % Reduction in Volume: 99.7% Epithelialization: Large (67-100%) Undermining: No Wound Description Classification: Category/Stage III Wound Margin: Epibole Exudate Amount: Small Exudate Type: Serous Exudate Color: amber Foul Odor After Cleansing: No Slough/Fibrino No Wound Bed Granulation Amount: Large (67-100%) Exposed Structure Granulation Quality: Red, Pink Fascia Exposed: No Necrotic Amount: None Present (0%) Fat Layer (Subcutaneous Tissue) Exposed: Yes Tendon Exposed: No Muscle Exposed: No Joint Exposed: No Bone Exposed: No Periwound Skin Texture Texture Color No Abnormalities Noted: Yes No Abnormalities Noted: Yes Moisture Temperature / Pain No Abnormalities Noted: Yes Temperature: No Abnormality Electronic Signature(s) Signed: 04/23/2023 5:11:25 PM By: Zenaida Deed RN, BSN Entered By: Zenaida Deed on 04/23/2023 11:03:14 Page, Sharin Grave ([id number]) [id number] Page 3 of 5 -------------------------------------------------------------------------------- Wound Assessment Details Patient Name: Date of Service: TERRESSA, HUTZELL 04/23/2023 10:30 A M Medical Record Number: [id number] Patient Account Number: [id number] Date of Birth/Sex: Treating RN: 04-10-1941 (82 y.o. [name] Primary Care Noal Abshier: [name] Other Clinician: Referring Chrishelle Zito: Treating Gianne Shugars/Extender: Bud Face in Treatment: 127 Wound Status Wound Number: 7 Primary Etiology: Pressure Ulcer Wound Location: Back Wound Status: Healed - Epithelialized Wounding Event: Pressure Injury Comorbid History: Hypertension, Dementia Date Acquired: 02/12/2023 Weeks Of Treatment: 10 Clustered Wound: No Photos Wound Measurements Length: (cm) Width: (cm) Depth: (cm) Area: (cm) Volume: (cm) 0 % Reduction in Area: 100% 0 % Reduction in Volume: 100% 0 Epithelialization: Large (67-100%) 0 Tunneling: No 0 Undermining: No Wound Description Classification: Category/Stage II Exudate Amount: None Present Foul Odor After Cleansing: No Slough/Fibrino Yes Wound Bed Granulation Amount: None Present (0%) Exposed Structure Necrotic Amount: None Present (0%) Fascia Exposed: No Fat Layer (Subcutaneous Tissue) Exposed: No Tendon Exposed:  No Muscle Exposed: No Joint Exposed: No Bone Exposed: No Periwound Skin Texture Texture Color No Abnormalities Noted: No No Abnormalities Noted: Yes Scarring: Yes Moisture No Abnormalities Noted: Yes Electronic Signature(s) Signed: 04/23/2023 5:11:25 PM By: Zenaida Deed RN, BSN Entered By: Zenaida Deed on 04/23/2023 11:03:48 Crute, Sharin Grave ([id number]) [id number] Page 4 of 5 -------------------------------------------------------------------------------- Wound Assessment Details Patient Name: Date of Service: LEXINE, [name] 04/23/2023 10:30 A M Medical Record Number: [id number] Patient Account Number: [id number] Date of Birth/Sex: Treating RN: June 24, 1941 (82 y.o. [name] Primary Care Takirah Binford: [name] Other Clinician: Referring Giavanni Odonovan: Treating Rhen Kawecki/Extender: Bud Face in Treatment: 127 Wound Status Wound Number: 8 Primary  Etiology: Pressure Ulcer Wound Location: Left T Second oe Wound Status: Open Wounding Event: Pressure Injury Comorbid History: Hypertension, Dementia Date Acquired: 04/15/2023 Weeks Of Treatment: 0 Clustered Wound: No Photos Wound Measurements Length: (cm) 0.3 Width: (cm) 0.3 Depth: (cm) 0.1 Area: (cm) 0.071 Volume: (cm) 0.007 % Reduction in Area: % Reduction in Volume: Epithelialization: Small (1-33%) Tunneling: No Undermining: No Wound Description Classification: Unstageable/Unclassified Wound Margin: Flat and Intact Exudate Amount: Small Exudate Type: Serosanguineous Exudate Color: red, brown Foul Odor After Cleansing: No Slough/Fibrino Yes Wound Bed Granulation Amount: None Present (0%) Exposed Structure Necrotic Amount: Large (67-100%) Fascia Exposed: No Necrotic Quality: Eschar Fat Layer (Subcutaneous Tissue) Exposed: Yes Tendon Exposed: No Muscle Exposed: No Joint Exposed: No Bone Exposed: No Periwound Skin Texture Texture Color No Abnormalities Noted: Yes No Abnormalities Noted: Yes Moisture Temperature / Pain No Abnormalities Noted: Yes Temperature: No Abnormality Electronic Signature(s) Signed: 04/23/2023 5:11:25 PM By: Zenaida Deed RN, BSN Entered By: Zenaida Deed on 04/23/2023 11:04:14 Volker, Sharin Grave ([id number]) [id number] Page 5 of 5 -------------------------------------------------------------------------------- Vitals Details Patient Name: Date of Service: JACOYA, CRESSY 04/23/2023 10:30 A M Medical Record Number: [id number] Patient Account Number: [id number] Date of Birth/Sex: Treating RN: [birth year] (82 y.o. [name] Primary Care Elesia Pemberton: [name] Other Clinician: Referring Shamaine Mulkern: Treating Maegen Wigle/Extender: Bud Face in Treatment: 127 Vital Signs Time Taken: 10:43 Temperature (F): 98.2 Pulse (bpm): 74 Blood Pressure (mmHg): 130/70 Reference Range: 80 -  120 mg / dl Airway Pulse Oximetry (%): 98 Electronic Signature(s) Signed: 04/23/2023 5:11:25 PM By: Zenaida Deed RN, BSN Entered By: Zenaida Deed on 04/23/2023 10:50:27

## 2023-05-21 ENCOUNTER — Encounter (HOSPITAL_BASED_OUTPATIENT_CLINIC_OR_DEPARTMENT_OTHER): Payer: Medicare Other | Attending: General Surgery | Admitting: General Surgery

## 2023-05-21 DIAGNOSIS — F0150 Vascular dementia without behavioral disturbance: Secondary | ICD-10-CM | POA: Insufficient documentation

## 2023-05-21 DIAGNOSIS — Z86711 Personal history of pulmonary embolism: Secondary | ICD-10-CM | POA: Diagnosis not present

## 2023-05-21 DIAGNOSIS — L97522 Non-pressure chronic ulcer of other part of left foot with fat layer exposed: Secondary | ICD-10-CM | POA: Insufficient documentation

## 2023-05-21 DIAGNOSIS — I428 Other cardiomyopathies: Secondary | ICD-10-CM | POA: Insufficient documentation

## 2023-05-21 DIAGNOSIS — Z86718 Personal history of other venous thrombosis and embolism: Secondary | ICD-10-CM | POA: Diagnosis not present

## 2023-05-21 DIAGNOSIS — I69318 Other symptoms and signs involving cognitive functions following cerebral infarction: Secondary | ICD-10-CM | POA: Diagnosis not present

## 2023-05-21 DIAGNOSIS — L89103 Pressure ulcer of unspecified part of back, stage 3: Secondary | ICD-10-CM | POA: Diagnosis present

## 2023-05-21 NOTE — Progress Notes (Signed)
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 wound cleanser prior to applying a clean dressing using gauze sponges, not tissue or cotton balls. Prim Dressing: Maxorb Extra Ag+ Alginate Dressing, 2x2 (in/in) 3 x Per Week/30 Days ary Discharge Instructions: Apply to wound bed as instructed Secondary Dressing: Zetuvit Plus Silicone Border Dressing 4x4 (in/in) (Home Health) 3 x Per Week/30 Days Discharge Instructions: Apply silicone border or abd pad with tape over primary dressing as directed. Wound #8 - T Second oe Wound Laterality: Left Prim Dressing: MediHoney Gel, tube 1.5 (oz) 3 x Per Week/30 Days ary Discharge Instructions: Apply to wound bed as instructed Secondary Dressing: Woven Gauze Sponges 2x2 in 3 x Per Week/30 Days Discharge Instructions: Apply over primary dressing as directed. Secured With: Insurance underwriter, Sterile 2x75 (in/in) 3 x Per Week/30 Days Discharge Instructions: Secure with stretch gauze  as directed. Electronic Signature(s) Signed: 05/21/2023 12:20:15 PM By: Duanne Guess MD FACS Entered By: Duanne Guess on 05/21/2023 12:18:13 Misner, Sharin Grave ([id number]) [id number] Page 5 of 9 -------------------------------------------------------------------------------- Problem List Details Patient Name: Date of Service: MARVIE, BREVIK 05/21/2023 10:30 A M Medical Record Number: [id number] Patient Account Number: [id number] Date of Birth/Sex: Treating RN: December 03, 1940 (82 y.o. 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, stage 3 11/10/2020 No Yes L97.522 Non-pressure chronic ulcer of other part of left foot with fat layer exposed 04/23/2023 No Yes Inactive Problems ICD-10 Code Description Active Date Inactive Date L89.153 Pressure ulcer of sacral region, stage 3 11/10/2020 11/10/2020 T81.31XA Disruption of external operation (surgical) wound, not elsewhere classified, initial 11/10/2020 11/10/2020 encounter Resolved Problems ICD-10 Code Description Active Date Resolved Date L89.323 Pressure ulcer of left buttock, stage 3 11/10/2020 11/10/2020 Electronic Signature(s) Signed: 05/21/2023 12:15:14 PM By: Duanne Guess MD FACS Entered By: Duanne Guess on 05/21/2023 12:15:14 -------------------------------------------------------------------------------- Progress Note Details Patient Name: Date of Service: [name] 05/21/2023 10:30 A M Medical Record Number: [id number] Patient Account Number: [id number] Date of Birth/Sex: Treating RN: Dec 28, 1940 (82 y.o. F) Primary  Abt, OZELLA COMINS ([id number]) [id number] Page 4 of 9 -------------------------------------------------------------------------------- Physician Orders Details Patient Name: Date of Service: DI, JASMER 05/21/2023 10:30 A M Medical Record Number: [id number] Patient Account Number: [id number] Date of Birth/Sex: Treating RN: Oct 08, 1940 (82 y.o. 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 - Telehealth Wed. 11/6 @ 10:30 am Bathing/ Shower/ Hygiene May shower and wash wound with soap and water. Off-Loading Low air-loss mattress (Group 2) Turn and reposition every 2 hours Additional Orders / Instructions Follow Nutritious Diet - Continue Juven and Nutrin Home Health No change in wound care orders this week; continue Home Health for wound care. May utilize formulary equivalent dressing for wound treatment orders unless otherwise specified. Other Home Health Orders/Instructions: - [facility] for Wound Care 3x week. [place] Dec [phone] Wound Treatment Wound #5 - Gluteus Wound Laterality: Left Cleanser: Wound Cleanser (Home Health) 3 x Per Week/30 Days Discharge Instructions: Cleanse the wound with wound cleanser prior to applying a clean dressing using gauze sponges, not tissue or cotton balls. Prim Dressing: Maxorb Extra Ag+ Alginate Dressing, 2x2 (in/in) 3 x Per Week/30 Days ary Discharge Instructions: Apply to wound bed as instructed Secondary Dressing: Zetuvit Plus Silicone Border Dressing 4x4 (in/in) (Home Health) 3 x Per Week/30 Days Discharge Instructions: Apply silicone border or abd pad with tape over primary dressing as directed. Wound #8 - T Second oe Wound Laterality: Left Prim Dressing: MediHoney Gel, tube 1.5 (oz) 3 x Per Week/30 Days ary Discharge Instructions: Apply to wound bed as instructed Secondary Dressing: Woven Gauze Sponges 2x2 in 3 x Per Week/30 Days Discharge Instructions: Apply over primary dressing as directed. Secured With: Insurance underwriter, Sterile 2x75 (in/in) 3 x Per Week/30 Days Discharge Instructions: Secure with stretch gauze  as directed. Electronic Signature(s) Signed: 05/21/2023 12:20:15 PM By: Duanne Guess MD FACS Entered By: Duanne Guess on 05/21/2023 12:18:13 Misner, Sharin Grave ([id number]) [id number] Page 5 of 9 -------------------------------------------------------------------------------- Problem List Details Patient Name: Date of Service: MARVIE, BREVIK 05/21/2023 10:30 A M Medical Record Number: [id number] Patient Account Number: [id number] Date of Birth/Sex: Treating RN: December 03, 1940 (82 y.o. 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, stage 3 11/10/2020 No Yes L97.522 Non-pressure chronic ulcer of other part of left foot with fat layer exposed 04/23/2023 No Yes Inactive Problems ICD-10 Code Description Active Date Inactive Date L89.153 Pressure ulcer of sacral region, stage 3 11/10/2020 11/10/2020 T81.31XA Disruption of external operation (surgical) wound, not elsewhere classified, initial 11/10/2020 11/10/2020 encounter Resolved Problems ICD-10 Code Description Active Date Resolved Date L89.323 Pressure ulcer of left buttock, stage 3 11/10/2020 11/10/2020 Electronic Signature(s) Signed: 05/21/2023 12:15:14 PM By: Duanne Guess MD FACS Entered By: Duanne Guess on 05/21/2023 12:15:14 -------------------------------------------------------------------------------- Progress Note Details Patient Name: Date of Service: [name] 05/21/2023 10:30 A M Medical Record Number: [id number] Patient Account Number: [id number] Date of Birth/Sex: Treating RN: Dec 28, 1940 (82 y.o. F) Primary  Abt, OZELLA COMINS ([id number]) [id number] Page 4 of 9 -------------------------------------------------------------------------------- Physician Orders Details Patient Name: Date of Service: DI, JASMER 05/21/2023 10:30 A M Medical Record Number: [id number] Patient Account Number: [id number] Date of Birth/Sex: Treating RN: Oct 08, 1940 (82 y.o. 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 - Telehealth Wed. 11/6 @ 10:30 am Bathing/ Shower/ Hygiene May shower and wash wound with soap and water. Off-Loading Low air-loss mattress (Group 2) Turn and reposition every 2 hours Additional Orders / Instructions Follow Nutritious Diet - Continue Juven and Nutrin Home Health No change in wound care orders this week; continue Home Health for wound care. May utilize formulary equivalent dressing for wound treatment orders unless otherwise specified. Other Home Health Orders/Instructions: - [facility] for Wound Care 3x week. [place] Dec [phone] Wound Treatment Wound #5 - Gluteus Wound Laterality: Left Cleanser: Wound Cleanser (Home Health) 3 x Per Week/30 Days Discharge Instructions: Cleanse the wound with wound cleanser prior to applying a clean dressing using gauze sponges, not tissue or cotton balls. Prim Dressing: Maxorb Extra Ag+ Alginate Dressing, 2x2 (in/in) 3 x Per Week/30 Days ary Discharge Instructions: Apply to wound bed as instructed Secondary Dressing: Zetuvit Plus Silicone Border Dressing 4x4 (in/in) (Home Health) 3 x Per Week/30 Days Discharge Instructions: Apply silicone border or abd pad with tape over primary dressing as directed. Wound #8 - T Second oe Wound Laterality: Left Prim Dressing: MediHoney Gel, tube 1.5 (oz) 3 x Per Week/30 Days ary Discharge Instructions: Apply to wound bed as instructed Secondary Dressing: Woven Gauze Sponges 2x2 in 3 x Per Week/30 Days Discharge Instructions: Apply over primary dressing as directed. Secured With: Insurance underwriter, Sterile 2x75 (in/in) 3 x Per Week/30 Days Discharge Instructions: Secure with stretch gauze  as directed. Electronic Signature(s) Signed: 05/21/2023 12:20:15 PM By: Duanne Guess MD FACS Entered By: Duanne Guess on 05/21/2023 12:18:13 Misner, Sharin Grave ([id number]) [id number] Page 5 of 9 -------------------------------------------------------------------------------- Problem List Details Patient Name: Date of Service: MARVIE, BREVIK 05/21/2023 10:30 A M Medical Record Number: [id number] Patient Account Number: [id number] Date of Birth/Sex: Treating RN: December 03, 1940 (82 y.o. 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, stage 3 11/10/2020 No Yes L97.522 Non-pressure chronic ulcer of other part of left foot with fat layer exposed 04/23/2023 No Yes Inactive Problems ICD-10 Code Description Active Date Inactive Date L89.153 Pressure ulcer of sacral region, stage 3 11/10/2020 11/10/2020 T81.31XA Disruption of external operation (surgical) wound, not elsewhere classified, initial 11/10/2020 11/10/2020 encounter Resolved Problems ICD-10 Code Description Active Date Resolved Date L89.323 Pressure ulcer of left buttock, stage 3 11/10/2020 11/10/2020 Electronic Signature(s) Signed: 05/21/2023 12:15:14 PM By: Duanne Guess MD FACS Entered By: Duanne Guess on 05/21/2023 12:15:14 -------------------------------------------------------------------------------- Progress Note Details Patient Name: Date of Service: [name] 05/21/2023 10:30 A M Medical Record Number: [id number] Patient Account Number: [id number] Date of Birth/Sex: Treating RN: Dec 28, 1940 (82 y.o. F) Primary  [id number] Date of Birth/Sex: Treating RN: [birth year] (82 y.o. 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

## 2023-05-21 NOTE — Progress Notes (Signed)
Exposed: No Necrotic Amount: Large (67-100%) Fat Layer (Subcutaneous Tissue) Exposed: Yes Necrotic Quality: Eschar Tendon Exposed: No Muscle Exposed: No Joint Exposed: No Bone Exposed: No Periwound Skin Texture Texture Color No Abnormalities Noted: Yes No Abnormalities Noted: Yes Moisture Temperature / Pain No Abnormalities Noted: Yes Temperature: No Abnormality Electronic Signature(s) Signed: 05/21/2023 4:30:25 PM By: Zenaida Deed RN, BSN Entered By: Zenaida Deed on 05/21/2023 11:07:38 -------------------------------------------------------------------------------- Vitals Details Patient Name: Date of Service: [name] Medical Record Number: [id number] Patient Account Number: [id number] Date of Birth/Sex: Treating RN: March 12, 1941 (82 y.o. [name] Primary Care Mylo Choi: Fleet [name] Other Clinician: Referring Sharie Amorin: Treating Danesha Kirchoff/Extender: Bud Face in Treatment: [address], [name] ([id number]) [phone].pdf Page 4 of 4 Vital Signs Time Taken: 10:45 Temperature (F): 97.9 Pulse (bpm): 62 Blood Pressure (mmHg): 122/68 Reference Range: 80 - 120 mg / dl Airway Pulse Oximetry (%): 95 Notes vitals taken by [facility] Electronic Signature(s) Signed: 05/21/2023 4:30:25 PM By: Zenaida Deed RN, BSN Entered By: Zenaida Deed on 05/21/2023 07:52:20  [name] ([id number]) [id number] Page 1 of 4 Visit Report for 05/21/2023 Allergy List Details Patient Name: Date of Service: [name] Medical Record Number: [id number] Patient Account Number: [id number] Date of Birth/Sex: Treating RN: 1941-03-17 (82 y.o. [name] Primary Care Anysa Tacey: Fleet [name] Other Clinician: Referring Maguadalupe Lata: Treating Imogene Gravelle/Extender: Bud Face in Treatment: 131 Allergies Active Allergies codeine alteplase Allergy Notes Electronic Signature(s) Signed: 05/21/2023 4:30:25 PM By: Zenaida Deed RN, BSN Entered By: Zenaida Deed on 05/21/2023 07:07:17 -------------------------------------------------------------------------------- Patient/Caregiver Education Details Patient Name: Date of Service: [name] Medical Record Number: [id number] Patient Account Number: [id number] Date of Birth/Gender: Treating RN: 05/27/1941 (82 y.o. [name] Primary Care Physician: Fleet [name] Other Clinician: Referring Physician: Treating Physician/Extender: Bud Face in Treatment: 131 Education Assessment Education Provided To: Caregiver Education Topics Provided Pressure: Methods: Explain/Verbal Responses: Reinforcements needed, State content correctly Wound/Skin Impairment: Methods: Explain/Verbal Responses: Reinforcements needed, State content correctly Electronic Signature(s) Signed: 05/21/2023 4:30:25 PM By: Zenaida Deed RN, BSN Entered By: Zenaida Deed on 05/21/2023 07:08:18 [name] ([id number]) [id number] Page 2 of 4 -------------------------------------------------------------------------------- Wound Assessment Details Patient Name: Date of Service: [name] Medical Record Number: [id number] Patient Account  Number: [id number] Date of Birth/Sex: Treating RN: 28-Dec-1940 (82 y.o. [name] Primary Care Egan Sahlin: Fleet [name] Other Clinician: Referring Mithran Strike: Treating Lacosta Hargan/Extender: Bud Face in Treatment: 131 Wound Status Wound Number: 5 Primary Etiology: Pressure Ulcer Wound Location: Left Gluteus Wound Status: Open Wounding Event: Gradually Appeared Comorbid History: Hypertension, Dementia Date Acquired: 09/12/2020 Weeks Of Treatment: 131 Clustered Wound: No Wound Measurements Length: (cm) 0.2 Width: (cm) 0.2 Depth: (cm) 0.2 Area: (cm) 0.031 Volume: (cm) 0.006 % Reduction in Area: 98.7% % Reduction in Volume: 99.7% Epithelialization: Large (67-100%) Tunneling: No Undermining: No Wound Description Classification: Category/Stage III Wound Margin: Epibole Exudate Amount: Small Exudate Type: Serosanguineous Exudate Color: red, brown Foul Odor After Cleansing: No Slough/Fibrino No Wound Bed Granulation Amount: Large (67-100%) Exposed Structure Granulation Quality: Red, Pink Fascia Exposed: No Necrotic Amount: None Present (0%) Fat Layer (Subcutaneous Tissue) Exposed: Yes Tendon Exposed: No Muscle Exposed: No Joint Exposed: No Bone Exposed: No Periwound Skin Texture Texture Color No Abnormalities Noted: Yes No Abnormalities Noted: Yes Moisture Temperature / Pain No Abnormalities Noted: Yes Temperature: No Abnormality Electronic Signature(s) Signed: 05/21/2023 4:30:25 PM By: Zenaida Deed RN, BSN Entered By: Zenaida Deed on 05/21/2023 07:33:28 -------------------------------------------------------------------------------- Wound Assessment Details Patient Name: Date of Service: [name] Medical Record Number: [id number] Patient Account Number: [id number] Date of Birth/Sex: Treating RN: 07/19/1941 (82 y.o. [name] Primary Care [name] Other Clinician: Referring  Kobie Whidby: Treating Madylin Fairbank/Extender: Bud Face in Treatment: [address], [name] ([id number]) [phone].pdf Page 3 of 4 Wound Status Wound Number: 8 Primary Etiology: Pressure Ulcer Wound Location: Left T Second oe Wound Status: Open Wounding Event: Pressure Injury Comorbid History: Hypertension, Dementia Date Acquired: 04/15/2023 Weeks Of Treatment: 4 Clustered Wound: No Photos Wound Measurements Length: (cm) 0.3 Width: (cm) 0.3 Depth: (cm) 0.1 Area: (cm) 0.071 Volume: (cm) 0.007 % Reduction in Area: 0% % Reduction in Volume: 0% Epithelialization: Small (1-33%) Tunneling: No Undermining: No Wound Description Classification: Unstageable/Unclassified Wound Margin: Flat and Intact Exudate Amount: Small Exudate Type: Serosanguineous Exudate Color: red, brown Foul Odor After Cleansing: No Slough/Fibrino Yes Wound Bed Granulation Amount: Small (1-33%) Exposed Structure Granulation Quality: Pink Fascia

## 2023-06-18 ENCOUNTER — Encounter (HOSPITAL_BASED_OUTPATIENT_CLINIC_OR_DEPARTMENT_OTHER): Payer: Medicare Other | Attending: General Surgery | Admitting: General Surgery

## 2023-06-18 DIAGNOSIS — L97522 Non-pressure chronic ulcer of other part of left foot with fat layer exposed: Secondary | ICD-10-CM | POA: Insufficient documentation

## 2023-06-18 DIAGNOSIS — Y838 Other surgical procedures as the cause of abnormal reaction of the patient, or of later complication, without mention of misadventure at the time of the procedure: Secondary | ICD-10-CM | POA: Insufficient documentation

## 2023-06-18 DIAGNOSIS — Z86711 Personal history of pulmonary embolism: Secondary | ICD-10-CM | POA: Diagnosis not present

## 2023-06-18 DIAGNOSIS — Z87891 Personal history of nicotine dependence: Secondary | ICD-10-CM | POA: Insufficient documentation

## 2023-06-18 DIAGNOSIS — F0150 Vascular dementia without behavioral disturbance: Secondary | ICD-10-CM | POA: Diagnosis not present

## 2023-06-18 DIAGNOSIS — I5181 Takotsubo syndrome: Secondary | ICD-10-CM | POA: Diagnosis not present

## 2023-06-18 DIAGNOSIS — I428 Other cardiomyopathies: Secondary | ICD-10-CM | POA: Insufficient documentation

## 2023-06-18 DIAGNOSIS — I10 Essential (primary) hypertension: Secondary | ICD-10-CM | POA: Insufficient documentation

## 2023-06-18 DIAGNOSIS — L89103 Pressure ulcer of unspecified part of back, stage 3: Secondary | ICD-10-CM | POA: Insufficient documentation

## 2023-06-18 DIAGNOSIS — Z7901 Long term (current) use of anticoagulants: Secondary | ICD-10-CM | POA: Insufficient documentation

## 2023-06-18 DIAGNOSIS — Z86718 Personal history of other venous thrombosis and embolism: Secondary | ICD-10-CM | POA: Diagnosis not present

## 2023-06-18 DIAGNOSIS — T8131XA Disruption of external operation (surgical) wound, not elsewhere classified, initial encounter: Secondary | ICD-10-CM | POA: Diagnosis not present

## 2023-06-18 NOTE — Progress Notes (Addendum)
[name], JAISA DEFINO ([id number]) [id number] Page 1 of 5 Visit Report for 06/18/2023 Allergy List Details Patient Name: Date of Service: [name], [name] 06/18/2023 10:30 A M Medical Record Number: [id number] Patient Account Number: [id number] Date of Birth/Sex: Treating RN: 11/15/1940 (82 y.o. F) Primary Care Lenzy Kerschner: [name] Other Clinician: Referring Ilianna Bown: Treating Breindy Meadow/Extender: Bud Face in Treatment: 135 Allergies Active Allergies codeine alteplase Allergy Notes Electronic Signature(s) Signed: 06/18/2023 10:31:54 AM By: Duanne Guess MD FACS Entered By: Duanne Guess on 06/18/2023 07:31:54 -------------------------------------------------------------------------------- Clinic Level of Care Assessment Details Patient Name: Date of Service: Sarah, [name] 06/18/2023 10:30 A M Medical Record Number: [id number] Patient Account Number: [id number] Date of Birth/Sex: Treating RN: 1941-05-14 (82 y.o. F) Primary Care Sahiti Joswick: [name] Other Clinician: Referring Timisha Mondry: Treating Makaria Poarch/Extender: Bud Face in Treatment: 135 Clinic Level of Care Assessment Items TOOL 1 Quantity Score []  - 0 Use when EandM and Procedure is performed on INITIAL visit ASSESSMENTS - Nursing Assessment / Reassessment []  - 0 General Physical Exam (combine w/ comprehensive assessment (listed just below) when performed on new pt. evals) []  - 0 Comprehensive Assessment (HX, ROS, Risk Assessments, Wounds Hx, etc.) ASSESSMENTS - Wound and Skin Assessment / Reassessment []  - 0 Dermatologic / Skin Assessment (not related to wound area) ASSESSMENTS - Ostomy and/or Continence Assessment and Care []  - 0 Incontinence Assessment and Management []  - 0 Ostomy Care Assessment and Management (repouching, etc.) PROCESS - Coordination of Care []  - 0 Simple Patient / Family Education for ongoing  care []  - 0 Complex (extensive) Patient / Family Education for ongoing care []  - 0 Staff obtains Chiropractor, Records, T Results / Process Orders est []  - 0 Staff telephones HHA, Nursing Homes / Clarify orders / etc [place], [place] D ([id number]) [phone].pdf Page 2 of 5 []  - 0 Routine Transfer to another Facility (non-emergent condition) []  - 0 Routine Hospital Admission (non-emergent condition) []  - 0 New Admissions / Manufacturing engineer / Ordering NPWT Apligraf, etc. , []  - 0 Emergency Hospital Admission (emergent condition) PROCESS - Special Needs []  - 0 Pediatric / Minor Patient Management []  - 0 Isolation Patient Management []  - 0 Hearing / Language / Visual special needs []  - 0 Assessment of Community assistance (transportation, D/C planning, etc.) []  - 0 Additional assistance / Altered mentation []  - 0 Support Surface(s) Assessment (bed, cushion, seat, etc.) INTERVENTIONS - Miscellaneous []  - 0 External ear exam []  - 0 Patient Transfer (multiple staff / Nurse, adult / Similar devices) []  - 0 Simple Staple / Suture removal (25 or less) []  - 0 Complex Staple / Suture removal (26 or more) []  - 0 Hypo/Hyperglycemic Management (do not check if billed separately) []  - 0 Ankle / Brachial Index (ABI) - do not check if billed separately Has the patient been seen at the hospital within the last three years: Yes Total Score: 0 Level Of Care: ____ Electronic Signature(s) Signed: 06/18/2023 12:00:21 PM By: Duanne Guess MD FACS Entered By: Duanne Guess on 06/18/2023 07:32:24 -------------------------------------------------------------------------------- Patient/Caregiver Education Details Patient Name: Date of Service: [name] 11/6/2024andnbsp10:30 A M Medical Record Number: [id number] Patient Account Number: [id number] Date of Birth/Gender: Treating RN: 1941-04-10 (82 y.o. [name] Primary Care Physician: [name] Other Clinician: Referring Physician: Treating Physician/Extender: Bud Face in Treatment: 135 Education Assessment Education Provided To: Patient Education Topics Provided Pressure: Methods: Explain/Verbal Responses: Reinforcements needed, State content correctly Wound/Skin Impairment: Methods: Explain/Verbal Responses: Reinforcements needed, State content  correctly Electronic Signature(s) Signed: 06/18/2023 4:26:50 PM By: Zenaida Deed RN, BSN Sauve, Sharin Grave ([id number]) [phone].pdf Page 3 of 5 Entered By: Zenaida Deed on 06/18/2023 07:55:06 -------------------------------------------------------------------------------- Wound Assessment Details Patient Name: Date of Service: Sarah, [name] 06/18/2023 10:30 A M Medical Record Number: [id number] Patient Account Number: [id number] Date of Birth/Sex: Treating RN: 1940-11-13 (82 y.o. [name] Primary Care Lamondre Wesche: [name] Other Clinician: Referring Floyd Wade: Treating Prestina Raigoza/Extender: Bud Face in Treatment: 135 Wound Status Wound Number: 5 Primary Etiology: Pressure Ulcer Wound Location: Left Gluteus Wound Status: Open Wounding Event: Gradually Appeared Comorbid History: Hypertension, Dementia Date Acquired: 09/12/2020 Weeks Of Treatment: 135 Clustered Wound: No Photos Wound Measurements Length: (cm) 0.5 Width: (cm) 0.5 Depth: (cm) 0.2 Area: (cm) 0.196 Volume: (cm) 0.039 % Reduction in Area: 91.7% % Reduction in Volume: 98.3% Epithelialization: Large (67-100%) Tunneling: No Undermining: No Wound Description Classification: Category/Stage III Wound Margin: Epibole Exudate Amount: Small Exudate Type: Serosanguineous Exudate Color: red, brown Foul Odor After Cleansing: No Slough/Fibrino No Wound Bed Granulation Amount: Large (67-100%) Exposed Structure Granulation Quality: Red, Pink Fascia  Exposed: No Necrotic Amount: None Present (0%) Fat Layer (Subcutaneous Tissue) Exposed: Yes Tendon Exposed: No Muscle Exposed: No Joint Exposed: No Bone Exposed: No Periwound Skin Texture Texture Color No Abnormalities Noted: Yes No Abnormalities Noted: Yes Moisture Temperature / Pain No Abnormalities Noted: Yes Temperature: No Abnormality Electronic Signature(s) Signed: 06/18/2023 4:26:50 PM By: Zenaida Deed RN, BSN Entered By: Zenaida Deed on 06/18/2023 07:50:51 Older, Sharin Grave ([id number]) [id number] Page 4 of 5 -------------------------------------------------------------------------------- Wound Assessment Details Patient Name: Date of Service: SHAINNA, FAUX 06/18/2023 10:30 A M Medical Record Number: [id number] Patient Account Number: [id number] Date of Birth/Sex: Treating RN: 1941/02/03 (82 y.o. [name] Primary Care Karinda Cabriales: [name] Other Clinician: Referring Taliah Porche: Treating Lucinda Spells/Extender: Bud Face in Treatment: 135 Wound Status Wound Number: 8 Primary Etiology: Pressure Ulcer Wound Location: Left T Second oe Wound Status: Open Wounding Event: Pressure Injury Comorbid History: Hypertension, Dementia Date Acquired: 04/15/2023 Weeks Of Treatment: 8 Clustered Wound: No Photos Wound Measurements Length: (cm) 0.1 Width: (cm) 0.1 Depth: (cm) 0.1 Area: (cm) 0.008 Volume: (cm) 0.001 % Reduction in Area: 88.7% % Reduction in Volume: 85.7% Epithelialization: Large (67-100%) Tunneling: No Undermining: No Wound Description Classification: Unstageable/Unclassified Wound Margin: Flat and Intact Exudate Amount: Small Exudate Type: Serous Exudate Color: amber Foul Odor After Cleansing: No Slough/Fibrino Yes Wound Bed Granulation Amount: None Present (0%) Exposed Structure Necrotic Amount: None Present (0%) Fascia Exposed: No Fat Layer (Subcutaneous Tissue) Exposed:  Yes Tendon Exposed: No Muscle Exposed: No Joint Exposed: No Bone Exposed: No Periwound Skin Texture Texture Color No Abnormalities Noted: Yes No Abnormalities Noted: Yes Moisture Temperature / Pain No Abnormalities Noted: Yes Temperature: No Abnormality Electronic Signature(s) Signed: 06/18/2023 4:26:50 PM By: Zenaida Deed RN, BSN Entered By: Zenaida Deed on 06/18/2023 07:47:17 Stopher, Sharin Grave ([id number]) [id number] Page 5 of 5 -------------------------------------------------------------------------------- Vitals Details Patient Name: Date of Service: LADINE, KIPER 06/18/2023 10:30 A M Medical Record Number: [id number] Patient Account Number: [id number] Date of Birth/Sex: Treating RN: 1941/05/31 (82 y.o. Billy Coast, Linda Primary Care Amanee Iacovelli: [name] Other Clinician: Referring Casmira Cramer: Treating Marquitta Persichetti/Extender: Bud Face in Treatment: 135 Vital Signs Time Taken: 10:30 Temperature (F): 97.4 Pulse (bpm): 65 Blood Pressure (mmHg): 128/66 Reference Range: 80 - 120 mg / dl Airway Pulse Oximetry (%): 96 Notes vitals per [facility] [place] at the home Electronic Signature(s) Signed: 06/18/2023 4:26:50 PM By: Zenaida Deed  RN, BSN Entered By: Zenaida Deed on 06/18/2023 07:32:42

## 2023-06-18 NOTE — Progress Notes (Signed)
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 manager and I participated in the call from our office in the wound care center. The patient and her nurse were at the patient's home. The buttock wound has enlarged and based on the photos provided by Sarah, the surface of the wound is clean. Maralyn Sago reports that the drainage is serosanguineous and  moderate in amount without any purulence. The patient has developed some blood blister-like lesions on her sacrum and they have been applying a foam border dressing to this area. 08/21/2022: Telehealth visit with the patient via her home health nurse Sarah. The case manager and I participated in the call from our office in the wound care center. The patient and her nurse were at the patient's home. Photos were provided by Maralyn Sago. The ulcer on the patient's buttock has contracted considerably and is much shallower. The periwound purple discoloration has also decreased markedly. Minimal drainage reported from the wound. 09/18/2022: T elehealth visit with the patient via her home health nurse Sarah. The case manager and I participated in the call from our office in the wound care center. The patient and her nurse were at the patient's home. Photos were provided by Maralyn Sago. The buttock ulcer is smaller again as of this visit. Maralyn Sago reports that the drainage is thinner and less copious. She has a couple of small purple marks adjacent to the wound that Maralyn Sago believes are secondary to tape irritation. [name], [name] ([id number]) [id number] Page 8 of 12 10/16/2022: Telehealth visit with the patient via her home health nurse Sarah. The case manager and I participated in the call from our office in the wound care center. The patient and  her nurse were at the patient's home. Photos were provided by Maralyn Sago. The buttock ulcer is shallower, but there is a "squishy area" adjacent that Maralyn Sago concerned may break open. Maralyn Sago reports that the drainage is looking a little bit more purulent. The purple marks that were present last time are no longer there. 11/13/2022: Telehealth visit with the patient via her home health nurse Sarah. The case manager and I participated in the call from our office in the wound care center. The patient and her nurse were at the patient's home. Photos were provided by  Maralyn Sago. A small pinpoint opening in the "squishy area" that Maralyn Sago was concerned about last visit did end up breaking open and is draining some seropurulent fluid. Otherwise there have been no significant changes. 01/01/2023: Telehealth visit with the patient via her home health nurse Sarah. The case manager and I participated in the call from our office in the wound care center. The patient and her nurse were at the patient's home. Photos were provided by Maralyn Sago. The pinpoint area that had opened during our last visit has now closed again. The tunnel that has been getting packed is smaller and closing up. 02/12/2023: This is a telehealth visit with the patient via her home health nurse Sarah. The case manager and I participated in the call from our office in the wound care center. The patient and her nurse were at the patient's home. Since our last visit, the patient was hospitalized with altered mental status and was found to be septic. Her wounds were not thought to be the source of sepsis, but rather some cellulitis in her arm. She responded well to antibiotics and is now home again. Maralyn Sago reports that the wound in her back reopened just a bit while she was in the hospital. We are still packing the tunnel on her buttock. 04/23/2023: This is a telehealth visit with the patient via her home health nurse Sarah. The case manager and I participated in the call from our office in the wound care center. The patient and her nurse were in the patient's home. Since our last visit, the buttock ulcer has healed. The wound on her back is down to just a small pinhole. She has a new wound on the knuckle of her left third toe. The patient's PCP had them applying Neosporin and said that the wound appeared to be closed, but based upon the photographs provided, there still appears to be an open area underneath a layer of dry eschar. 05/21/2023: This is a telehealth visit with the patient via her home health nurse Sarah. The  case manager and I participated in the call from our office in the wound care center. The patient and her nurse were in the patient's home. The buttock ulcer remains closed. The wound on her back is so small that they can no longer pack any dressing into the cavity. The wound on her left third toe is smaller but still fairly dry, despite using Medihoney. 06/18/2023: This is a telehealth visit with the patient via her home health nurse Sarah. The case manager and I participated in the call from our office in the wound care center. The patient and her nurse were in the patient's home. The wound on her back is small and shallow. The wound on her left third toe is nearly closed. Patient History Unable to Obtain Patient History due to Dementia. Information obtained from Patient. Allergies codeine, alteplase Family History Cancer - Siblings, Lung Disease - Siblings, No  family history of Diabetes, Heart Disease, Hereditary Spherocytosis, Hypertension, Kidney Disease, Seizures, Stroke, Thyroid Problems, Tuberculosis. Social History Former smoker, Marital Status - Widowed, Alcohol Use - Never, Drug Use - No History, Caffeine Use - Daily. Medical History Eyes Denies history of Cataracts, Glaucoma, Optic Neuritis Ear/Nose/Mouth/Throat Denies history of Chronic sinus problems/congestion, Middle ear problems Hematologic/Lymphatic Denies history of Anemia, Hemophilia, Human Immunodeficiency Virus, Lymphedema, Sickle Cell Disease Respiratory Denies history of Aspiration, Asthma, Chronic Obstructive Pulmonary Disease (COPD), Pneumothorax, Sleep Apnea, Tuberculosis Cardiovascular Patient has history of Hypertension Denies history of Angina, Arrhythmia, Congestive Heart Failure, Coronary Artery Disease, Deep Vein Thrombosis, Hypotension, Myocardial Infarction, Peripheral Arterial Disease, Peripheral Venous Disease, Phlebitis, Vasculitis Gastrointestinal Denies history of Cirrhosis , Colitis, Crohns,  Hepatitis A, Hepatitis B, Hepatitis C Endocrine Denies history of Type I Diabetes, Type II Diabetes Genitourinary Denies history of End Stage Renal Disease Immunological Denies history of Lupus Erythematosus, Raynauds, Scleroderma Integumentary (Skin) Denies history of History of Burn Musculoskeletal Denies history of Gout, Rheumatoid Arthritis, Osteoarthritis, Osteomyelitis Neurologic Patient has history of Dementia Denies history of Neuropathy, Quadriplegia, Paraplegia, Seizure Disorder Oncologic Denies history of Received Chemotherapy, Received Radiation Psychiatric Denies history of Anorexia/bulimia, Confinement Anxiety Hospitalization/Surgery History - OR debridement/ metal plate removal 53/66. - cellulitis to arm. [name], [name] ([id number]) [id number] Page 9 of 12 Objective Constitutional Vital signs provided by the home health nurse.. Vitals Time Taken: 10:30 AM, Temperature: 97.4 F, Pulse: 65 bpm, Blood Pressure: 128/66 mmHg, Pulse Oximetry: 96 %. General Notes: vitals per [facility] at the home General Notes: 06/18/2023: Photographs of the wounds were reviewed. They were a little bit blurry, but there is no visible opening on the toe. The patient's nurse says there is still little bit of drainage on dressings, however. The wound on her back is very small and according to the nurse, superficial. Integumentary (Hair, Skin) Wound #5 status is Open. Original cause of wound was Gradually Appeared. The date acquired was: 09/12/2020. The wound has been in treatment 135 weeks. The wound is located on the Left Gluteus. The wound measures 0.5cm length x 0.5cm width x 0.2cm depth; 0.196cm^2 area and 0.039cm^3 volume. There is Fat Layer (Subcutaneous Tissue) exposed. There is no tunneling or undermining noted. There is a small amount of serosanguineous drainage noted. The wound margin is epibole. There is large (67-100%) red, pink granulation within the wound bed.  There is no necrotic tissue within the wound bed. The periwound skin appearance had no abnormalities noted for texture. The periwound skin appearance had no abnormalities noted for moisture. The periwound skin appearance had no abnormalities noted for color. Periwound temperature was noted as No Abnormality. Wound #8 status is Open. Original cause of wound was Pressure Injury. The date acquired was: 04/15/2023. The wound has been in treatment 8 weeks. The wound is located on the Left T Second. The wound measures 0.1cm length x 0.1cm width x 0.1cm depth; 0.008cm^2 area and 0.001cm^3 volume. There is oe Fat Layer (Subcutaneous Tissue) exposed. There is no tunneling or undermining noted. There is a small amount of serous drainage noted. The wound margin is flat and intact. There is no granulation within the wound bed. There is no necrotic tissue within the wound bed. The periwound skin appearance had no abnormalities noted for texture. The periwound skin appearance had no abnormalities noted for moisture. The periwound skin appearance had no abnormalities noted for color. Periwound temperature was noted as No Abnormality. 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 - Telehealth Wed. 07/30/23 @ 10:30 am Bathing/ Shower/ Hygiene: May shower and wash wound with soap and water. Off-Loading: Low air-loss mattress (Group 2) Turn and reposition every 2 hours Additional Orders / Instructions: Follow Nutritious Diet - Continue Juven and Nutrin Home Health: No change in wound care orders this week; continue Home Health for wound care. May utilize formulary equivalent dressing for wound treatment orders unless otherwise specified. Other Home Health Orders/Instructions: - [facility] for Wound Care 3x week. [place]  [phone] WOUND #5: - Gluteus Wound Laterality: Left Cleanser: Wound Cleanser (Home Health) 3 x Per Week/30 Days Discharge Instructions: Cleanse the wound with wound cleanser prior to applying a clean dressing using gauze sponges, not tissue or cotton balls. Prim Dressing: Maxorb Extra Ag+ Alginate Dressing, 2x2 (in/in) 3 x Per Week/30 Days ary Discharge Instructions: Apply to wound bed as instructed Secondary Dressing: Zetuvit Plus Silicone Border Dressing 4x4 (in/in) (Home Health) 3 x Per Week/30 Days Discharge Instructions: Apply silicone border or abd pad with tape over primary dressing as directed. WOUND #8: - T Second Wound Laterality: Left oe Prim Dressing: MediHoney Gel, tube 1.5 (oz) 3 x Per Week/30 Days ary Discharge Instructions: Apply to wound bed as instructed Secondary Dressing: Woven Gauze Sponges 2x2 in 3 x Per Week/30 Days Discharge Instructions: Apply over primary dressing as directed. Secured With: Insurance underwriter, Sterile 2x75 (in/in) 3 x Per Week/30 Days Discharge Instructions: Secure with stretch gauze as directed. 06/18/2023: This is a telehealth visit with the patient via her home health nurse Sarah. The case manager and I participated in the call from our office in the wound care center. The patient and her nurse were in the patient's home. The wound on her back is small and shallow. The wound on her left third toe is nearly closed. There is really no depth to the back wound to pack so we will continue silver alginate dressing. The wound was getting a little macerated around the edges so they stopped the gentamicin and I do not see any need to restart this at this point. The toe is nearly healed with the current regimen of Medihoney and dry gauze so we will continue this, as well. Follow-up telehealth visit in 1 month. [name], [name] ([id number]) [id number] Page 10 of 12 Electronic Signature(s) Signed: 06/18/2023  12:00:21 PM By: Duanne Guess MD FACS Signed: 06/18/2023 4:26:50 PM By: Zenaida Deed RN, BSN Previous Signature: 06/18/2023 10:35:52 AM Version By: Duanne Guess MD FACS Entered By: Zenaida Deed on 06/18/2023 07:52:18 -------------------------------------------------------------------------------- HxROS Details Patient Name: Date of Service: Sarah Hose D. 06/18/2023 10:30 A M Medical Record Number: [id number] Patient Account Number: [id number] Date of Birth/Sex: Treating RN: 04-21-1941 (82 y.o. F) Primary Care Provider: [name] Other Clinician: Referring Provider: Treating Provider/Extender: Bud Face in Treatment: 135 Unable to Obtain Patient History due to Dementia Information Obtained From Patient Eyes Medical History: Negative for: Cataracts; Glaucoma; Optic Neuritis Ear/Nose/Mouth/Throat Medical History: Negative for: Chronic sinus problems/congestion; Middle ear problems Hematologic/Lymphatic Medical History: Negative for: Anemia; Hemophilia; Human Immunodeficiency Virus; Lymphedema; Sickle Cell Disease Respiratory Medical History: Negative for: Aspiration; Asthma; Chronic Obstructive Pulmonary Disease (COPD); Pneumothorax; Sleep Apnea; Tuberculosis Cardiovascular Medical History: Positive for: Hypertension Negative for: Angina; Arrhythmia; Congestive Heart Failure; Coronary Artery Disease; Deep Vein Thrombosis; Hypotension; Myocardial Infarction; Peripheral Arterial Disease; Peripheral Venous Disease; Phlebitis; Vasculitis Gastrointestinal Medical  History: Negative for: Cirrhosis ; Colitis; Crohns; Hepatitis A; Hepatitis B; Hepatitis C Endocrine Medical History: Negative for: Type I Diabetes; Type II Diabetes Genitourinary Medical History: Negative for: End Stage Renal Disease Immunological Medical History: Negative for: Lupus Erythematosus; Raynauds; Scleroderma Integumentary (Skin) [place], [name] ([id number])  [id number] Page 11 of 12 Medical History: Negative for: History of Burn Musculoskeletal Medical History: Negative for: Gout; Rheumatoid Arthritis; Osteoarthritis; Osteomyelitis Neurologic Medical History: Positive for: Dementia Negative for: Neuropathy; Quadriplegia; Paraplegia; Seizure Disorder Oncologic Medical History: Negative for: Received Chemotherapy; Received Radiation Psychiatric Medical History: Negative for: Anorexia/bulimia; Confinement Anxiety Immunizations Pneumococcal Vaccine: Received Pneumococcal Vaccination: No Implantable Devices None Hospitalization / Surgery History Type of Hospitalization/Surgery OR debridement/ metal plate removal 40/98 cellulitis to arm Family and Social History Cancer: Yes - Siblings; Diabetes: No; Heart Disease: No; Hereditary Spherocytosis: No; Hypertension: No; Kidney Disease: No; Lung Disease: Yes - Siblings; Seizures: No; Stroke: No; Thyroid Problems: No; Tuberculosis: No; Former smoker; Marital Status - Widowed; Alcohol Use: Never; Drug Use: No History; Caffeine Use: Daily; Financial Concerns: No; Food, Clothing or Shelter Needs: No; Support System Lacking: No; Transportation Concerns: No Electronic Signature(s) Signed: 06/18/2023 12:00:21 PM By: Duanne Guess MD FACS Entered By: Duanne Guess on 06/18/2023 07:32:02 -------------------------------------------------------------------------------- SuperBill Details Patient Name: Date of Service: Sarah Hose D. 06/18/2023 Medical Record Number: [id number] Patient Account Number: [id number] Date of Birth/Sex: Treating RN: [birth year] (82 y.o. 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

## 2023-07-30 ENCOUNTER — Encounter (HOSPITAL_BASED_OUTPATIENT_CLINIC_OR_DEPARTMENT_OTHER): Payer: Medicare Other | Attending: General Surgery | Admitting: General Surgery

## 2023-07-30 DIAGNOSIS — Z86718 Personal history of other venous thrombosis and embolism: Secondary | ICD-10-CM | POA: Insufficient documentation

## 2023-07-30 DIAGNOSIS — I10 Essential (primary) hypertension: Secondary | ICD-10-CM | POA: Insufficient documentation

## 2023-07-30 DIAGNOSIS — F0150 Vascular dementia without behavioral disturbance: Secondary | ICD-10-CM | POA: Insufficient documentation

## 2023-07-30 DIAGNOSIS — L97522 Non-pressure chronic ulcer of other part of left foot with fat layer exposed: Secondary | ICD-10-CM | POA: Diagnosis not present

## 2023-07-30 DIAGNOSIS — L89103 Pressure ulcer of unspecified part of back, stage 3: Secondary | ICD-10-CM | POA: Diagnosis present

## 2023-07-30 DIAGNOSIS — I428 Other cardiomyopathies: Secondary | ICD-10-CM | POA: Diagnosis not present

## 2023-07-30 DIAGNOSIS — Z981 Arthrodesis status: Secondary | ICD-10-CM | POA: Diagnosis not present

## 2023-07-30 DIAGNOSIS — Z7901 Long term (current) use of anticoagulants: Secondary | ICD-10-CM | POA: Insufficient documentation

## 2023-07-30 DIAGNOSIS — Z8673 Personal history of transient ischemic attack (TIA), and cerebral infarction without residual deficits: Secondary | ICD-10-CM | POA: Diagnosis not present

## 2023-07-30 NOTE — Progress Notes (Signed)
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 health nurse. The case manager and I participated on the call from our office in the clinic. The wound on her back is closed. The buttock wound has a purpleish fluctuant area just adjacent to it. There is still tunneling present and the drainage is serosanguineous. 05/15/2022: T elehealth visit with the patient via her home health nurse, Sarah. The case manager and I  participated in the call from our office in the clinic. Since our last visit, the patient developed 2 areas adjacent to her back wound that look like blood blisters. Maralyn Sago had contacted [place] at the end of last week and we advised applying silver alginate to both sites. She says that they are stable and do not seem to be progressing. Photographs provided by Maralyn Sago demonstrate this. The area of purpleish discoloration adjacent to the buttock wound has started to open, as we had feared. The nature of the drainage from the tunnel has also changed. It is more seropurulent than mucopurulent. The patient also expressed some pain today when the wound was being probed for depth. 06/19/2022: T elehealth visit with the patient via her home health nurse Sarah. The case manager and I participated in the call from our office in the wound care center. The patient and her nurse were at the patient's home. The blood blisters on the patient's back have dried and flattened out. The area of purpleish discoloration adjacent to the buttock wound has now basically formed tunneling area that seems to connect to the old tunnel. The drainage is serosanguineous rather than purulent. It is about 2.5 cm, down from 3.5 cm. Photographs of both sites were provided by the patient's nurse. 07/17/2022: T elehealth visit with the patient via her home health nurse Sarah. The case manager and I participated in the call from our office in the wound care center. The patient and her nurse were at the patient's home. The buttock wound has enlarged and  based on the photos provided by Sarah, the surface of the wound is clean. Maralyn Sago reports that the drainage is serosanguineous and moderate in amount without any purulence. The patient has developed some blood blister-like lesions on her sacrum and they have been applying a foam border dressing to this area. 08/21/2022: Telehealth visit with the patient via her home health nurse Sarah. The case manager  and I participated in the call from our office in the wound care center. The patient and her nurse were at the patient's home. Photos were provided by Maralyn Sago. The ulcer on the patient's buttock has contracted considerably and is much shallower. The periwound purple discoloration has also decreased markedly. Minimal drainage reported from the wound. 09/18/2022: T elehealth visit with the patient via her home health nurse Sarah. The case manager and I participated in the call from our office in the wound care center. The patient and her nurse were at the patient's home. Photos were provided by Maralyn Sago. The buttock ulcer is smaller again as of this visit. Maralyn Sago reports that the drainage is thinner and less copious. She has a couple of small purple marks adjacent to the wound that Maralyn Sago believes are secondary to tape irritation. 10/16/2022: Telehealth visit with the patient via her home health nurse Sarah. The case manager and I participated in the call from our office in the wound care center. The patient and her nurse were at the patient's home. Photos were provided by Maralyn Sago. The buttock ulcer is shallower, but there is a "squishy area" adjacent that Maralyn Sago concerned may break open. Maralyn Sago reports that the drainage is looking a little bit more purulent. The purple marks that were present last time are no longer there. 11/13/2022: Telehealth visit with the patient via her home health nurse Sarah. The case manager and I participated in the call from our office in the wound care center. The patient and her nurse were at the patient's home. Photos were provided by Maralyn Sago. A small pinpoint opening in the "squishy area" that Maralyn Sago was concerned about last visit did end up breaking open and is draining some seropurulent fluid. Otherwise there have been no significant changes. 01/01/2023: Telehealth visit with the patient via her home health nurse Sarah. The case manager and I participated in the call from our office in the  wound care center. The patient and her nurse were at the patient's home. Photos were provided by Maralyn Sago. The pinpoint area that had opened during our last visit has now closed again. The tunnel that has been getting packed is smaller and closing up. 02/12/2023: This is a telehealth visit with the patient via her home health nurse Sarah. The case manager and I participated in the call from our office in the wound care center. The patient and her nurse were at the patient's home. Since our last visit, the patient was hospitalized with altered mental status and was found to be septic. Her wounds were not thought to be the source of sepsis, but rather some cellulitis in her arm. She responded well to antibiotics and is now home again. Maralyn Sago reports that the wound in her back reopened just a bit while she was in the hospital. We are still packing the tunnel on her buttock. 04/23/2023: This is a telehealth visit with the patient via her home health nurse Sarah. The case manager and I participated in the call from our office in the wound care center. The patient and her nurse were in the patient's home. Since  our last visit, the buttock ulcer has healed. The wound on her back is down to just a small pinhole. She has a new wound on the knuckle of her left third toe. The patient's PCP had them applying Neosporin and said that the wound appeared to be closed, but based upon the photographs provided, there still appears to be an open area underneath a layer of dry eschar. 05/21/2023: This is a telehealth visit with the patient via her home health nurse Sarah. The case manager and I participated in the call from our office in the wound care center. The patient and her nurse were in the patient's home. The buttock ulcer remains closed. The wound on her back is so small that they can no longer pack any dressing into the cavity. The wound on her left third toe is smaller but still fairly dry, despite using  Medihoney. 06/18/2023: This is a telehealth visit with the patient via her home health nurse Sarah. The case manager and I participated in the call from our office in the wound care center. The patient and her nurse were in the patient's home. The wound on her back is small and shallow. The wound on her left third toe is nearly closed. 07/30/2023: This is a telehealth visit with the patient via her home health nurse, Maralyn Sago. The case manager and I participated in the call from our office in the wound care center. The patient and her nurse were in the patient's home. The wound on her back is down to just a shallow, narrow slit. The wound on her left third toe just has a scab on it. Objective Constitutional Bradycardic, asymptomatic. Sarah [name], Sarah [name] ([id number]) [id number] Page 9 of 10 Vitals Time Taken: 10:15 AM, Temperature: 97.9 F, Pulse: 50 bpm, Respiratory Rate: 18 breaths/min, Blood Pressure: 136/62 mmHg, Pulse Oximetry: 96 %. General Notes: 07/30/2023: Photographs of the wound, provided by her nurse, were reviewed. The wound on her back is down to just a shallow, narrow slit. I cannot appreciate much of an opening at all here. The wound on her left third toe just has a scab on it. Integumentary (Hair, Skin) Wound #5 status is Open. Original cause of wound was Gradually Appeared. The date acquired was: 09/12/2020. The wound has been in treatment 141 weeks. The wound is located on the Left Gluteus. The wound measures 0.2cm length x 0.2cm width x 0.1cm depth; 0.031cm^2 area and 0.003cm^3 volume. There is Fat Layer (Subcutaneous Tissue) exposed. There is no tunneling or undermining noted. There is a small amount of serosanguineous drainage noted. The wound margin is epibole. There is large (67-100%) red, pink granulation within the wound bed. There is no necrotic tissue within the wound bed. The periwound skin appearance had no abnormalities noted for texture. The  periwound skin appearance had no abnormalities noted for moisture. The periwound skin appearance had no abnormalities noted for color. Periwound temperature was noted as No Abnormality. Wound #8 status is Open. Original cause of wound was Pressure Injury. The date acquired was: 04/15/2023. The wound has been in treatment 14 weeks. The wound is located on the Left T Second. The wound measures 0.1cm length x 0.1cm width x 0.1cm depth; 0.008cm^2 area and 0.001cm^3 volume. There is oe Fat Layer (Subcutaneous Tissue) exposed. There is no tunneling or undermining noted. There is a none present amount of drainage noted. The wound margin is flat and intact. There is no granulation within the wound bed. There is a small (1-33%) amount of  necrotic tissue within the wound bed including Eschar. The periwound skin appearance had no abnormalities noted for texture. The periwound skin appearance had no abnormalities noted for moisture. The periwound skin appearance had no abnormalities noted for color. Periwound temperature was noted as No Abnormality. 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 - Telehealth Wed. 08/27/23 @ 10:30 am Bathing/ Shower/ Hygiene: May shower and wash wound with soap and water. Off-Loading: Low air-loss mattress (Group 2) Turn and reposition every 2 hours Additional Orders / Instructions: Follow Nutritious Diet - Continue Juven and Nutrin Home Health: No change in wound care orders this week; continue Home Health for wound care. May utilize formulary equivalent dressing for wound treatment orders unless otherwise specified. Other Home Health Orders/Instructions: - [facility] for Wound Care 3x week. [place] [phone] (cell) WOUND #5: - Gluteus Wound Laterality: Left Cleanser: Wound Cleanser  (Home Health) 3 x Per Week/30 Days Discharge Instructions: Cleanse the wound with wound cleanser prior to applying a clean dressing using gauze sponges, not tissue or cotton balls. Prim Dressing: Maxorb Extra Ag+ Alginate Dressing, 2x2 (in/in) 3 x Per Week/30 Days ary Discharge Instructions: Apply to wound bed as instructed Secondary Dressing: Zetuvit Plus Silicone Border Dressing 4x4 (in/in) (Home Health) 3 x Per Week/30 Days Discharge Instructions: Apply silicone border or abd pad with tape over primary dressing as directed. WOUND #8: - T Second Wound Laterality: Left oe Prim Dressing: MediHoney Gel, tube 1.5 (oz) 3 x Per Week/30 Days ary Discharge Instructions: Apply to wound bed as instructed Secondary Dressing: Woven Gauze Sponges 2x2 in 3 x Per Week/30 Days Discharge Instructions: Apply over primary dressing as directed. Secured With: Insurance underwriter, Sterile 2x75 (in/in) 3 x Per Week/30 Days Discharge Instructions: Secure with stretch gauze as directed. 07/30/2023: This is a telehealth visit with the patient via her home health nurse, Maralyn Sago. The case manager and I participated in the call from our office in the wound care center. The patient and her nurse were in the patient's home. The wound on her back is down to just a shallow, narrow slit. The wound on her left third toe just has a scab on it. Due to telehealth visit, no procedures were performed today. We will continue Medihoney on the toe; I anticipate this will soften the scab up to the point where it will fall off and reveal healed site. Continue silver alginate to the back. She is on a low-air-loss mattress and is getting nutritional supplements including Juven. Follow-up telehealth visit in 1 month. Electronic Signature(s) Signed: 07/30/2023 11:33:48 AM By: Duanne Guess MD FACS Entered By: Duanne Guess on 07/30/2023 11:32:43 Sarah [name], Sarah [name] ([id number]) [id number] Page  10 of 10 -------------------------------------------------------------------------------- SuperBill Details Patient Name: Date of Service: Sarah [name], Sarah [name] 07/30/2023 Medical Record Number: [id number] Patient Account Number: [id number] Date of Birth/Sex: Treating RN: 1940-12-07 (82 y.o. 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

## 2023-07-31 ENCOUNTER — Telehealth (HOSPITAL_BASED_OUTPATIENT_CLINIC_OR_DEPARTMENT_OTHER): Payer: Self-pay | Admitting: Cardiology

## 2023-07-31 NOTE — Telephone Encounter (Signed)
Patient's son is calling to follow up. Please advise.

## 2023-07-31 NOTE — Progress Notes (Signed)
[name], [name] ([id number]) [id number] Page 1 of 7 Visit Report for 07/30/2023 Arrival Information Details Patient Name: Date of Service: [name], [name] 07/30/2023 10:30 A M Medical Record Number: [id number] Patient Account Number: [id number] Date of Birth/Sex: Treating RN: [birth year] (82 y.o. [name] Primary Care [name]: [name] Other Clinician: Referring [name]: Treating [name]/Extender: [name] in Treatment: 141 Visit Information History Since Last Visit Added or deleted any medications: No Patient Arrived: Other Any new allergies or adverse reactions: No Arrival Time: 10:37 Had a fall or experienced change in No Transfer Assistance: Other activities of daily living that may affect Patient Identification Verified: Yes risk of falls: Secondary Verification Process Completed: Yes Signs or symptoms of abuse/neglect since last visito No Patient Requires Transmission-Based Precautions: No Hospitalized since last visit: No Patient Has Alerts: No Implantable device outside of the clinic excluding No cellular tissue based products placed in the center since last visit: Has Dressing in Place as Prescribed: Yes Pain Present Now: No Notes telehealth visit Electronic Signature(s) Signed: 07/30/2023 5:22:11 PM By: Sarah Deed RN, BSN Entered By: [name] on 07/30/2023 10:38:49 -------------------------------------------------------------------------------- Encounter Discharge Information Details Patient Name: Date of Service: Sarah Hose D. 07/30/2023 10:30 A M Medical Record Number: [id number] Patient Account Number: [id number] Date of Birth/Sex: Treating RN: 1940/11/04 (81 y.o. [name] Primary Care [name]: [name] Other Clinician: Referring [name]: Treating [name]/Extender: [name] in Treatment: [phone] Encounter Discharge  Information Items Discharge Condition: Stable Ambulatory Status: Wheelchair Discharge Destination: Home Schedule Follow-up Appointment: Yes Clinical Summary of Care: Notes televisit Electronic Signature(s) Signed: 07/30/2023 5:22:11 PM By: Sarah Deed RN, BSN Entered By: [name] on 07/30/2023 10:52:57 [name], [name] ([id number]) [id number] Page 2 of 7 -------------------------------------------------------------------------------- Lower Extremity Assessment Details Patient Name: Date of Service: [name], [name] 07/30/2023 10:30 A M Medical Record Number: [id number] Patient Account Number: [id number] Date of Birth/Sex: Treating RN: [birth year] (82 y.o. [name] Primary Care [name]: [name] Other Clinician: Referring [name]: Treating [name]/Extender: [name] in Treatment: 141 Electronic Signature(s) Signed: 07/30/2023 5:22:11 PM By: Sarah Deed RN, BSN Entered By: [name] on 07/30/2023 10:40:48 -------------------------------------------------------------------------------- Multi Wound Chart Details Patient Name: Date of Service: Sarah Hose D. 07/30/2023 10:30 A M Medical Record Number: [id number] Patient Account Number: [id number] Date of Birth/Sex: Treating RN: 07/10/1941 (82 y.o. F) Primary Care [name]: [name] Other Clinician: Referring [name]: Treating [name]/Extender: [name] in Treatment: 141 Vital Signs Height(in): Pulse(bpm): 50 Weight(lbs): Blood Pressure(mmHg): 136/62 Body Mass Index(BMI): Temperature(F): 97.9 Respiratory Rate(breaths/min): 18 [5:Photos:] [N/A:N/A] Left Gluteus Left T Second oe N/A Wound Location: Gradually Appeared Pressure Injury N/A Wounding Event: Pressure Ulcer Pressure Ulcer N/A Primary Etiology: Hypertension, Dementia Hypertension, Dementia N/A Comorbid History: 09/12/2020  04/15/2023 N/A Date Acquired: 141 14 N/A Weeks of Treatment: Open Open N/A Wound Status: No No N/A Wound Recurrence: 0.2x0.2x0.1 0.1x0.1x0.1 N/A Measurements L x W x D (cm) 0.031 0.008 N/A A (cm) : rea 0.003 0.001 N/A Volume (cm) : 98.70% 88.70% N/A % Reduction in A rea: 99.90% 85.70% N/A % Reduction in Volume: Category/Stage III Unstageable/Unclassified N/A Classification: Small None Present N/A Exudate A mount: Serosanguineous N/A N/A Exudate Type: red, brown N/A N/A Exudate Color: Epibole Flat and Intact N/A Wound Margin: Large (67-100%) None Present (0%) N/A Granulation A mount: Red, Pink N/A N/A Granulation Quality: None Present (0%) Small (1-33%) N/A Necrotic A mount: N/A Eschar N/A Necrotic Tissue: [name], [name] ([id number]) [id number]  Page 3 of 7 Fat Layer (Subcutaneous Tissue): Yes Fat Layer (Subcutaneous Tissue): Yes N/A Exposed Structures: Fascia: No Fascia: No Tendon: No Tendon: No Muscle: No Muscle: No Joint: No Joint: No Bone: No Bone: No Large (67-100%) Large (67-100%) N/A Epithelialization: No Abnormalities Noted No Abnormalities Noted N/A Periwound Skin Texture: No Abnormalities Noted No Abnormalities Noted N/A Periwound Skin Moisture: No Abnormalities Noted No Abnormalities Noted N/A Periwound Skin Color: No Abnormality No Abnormality N/A Temperature: Treatment Notes Electronic Signature(s) Signed: 07/30/2023 11:33:48 AM By: Sarah Guess MD FACS Entered By: [name] on 07/30/2023 10:57:21 -------------------------------------------------------------------------------- Multi-Disciplinary Care Plan Details Patient Name: Date of Service: Sarah Hose D. 07/30/2023 10:30 A M Medical Record Number: [id number] Patient Account Number: [id number] Date of Birth/Sex: Treating RN: 05/24/1941 (82 y.o. [name] Primary Care Javonn Gauger: [name] Other Clinician: Referring  Rylie Knierim: Treating Daesia Zylka/Extender: [name] in Treatment: [phone] Multidisciplinary Care Plan reviewed with physician Active Inactive Pressure Nursing Diagnoses: Knowledge deficit related to causes and risk factors for pressure ulcer development Goals: Patient will remain free from development of additional pressure ulcers Date Initiated: 11/10/2020 Target Resolution Date: 08/27/2023 Goal Status: Active Interventions: Assess: immobility, friction, shearing, incontinence upon admission and as needed Assess offloading mechanisms upon admission and as needed Assess potential for pressure ulcer upon admission and as needed Provide education on pressure ulcers Treatment Activities: Patient referred for home evaluation of offloading devices/mattresses : 11/10/2020 Notes: 12/22/20 Pressure relief ongoing issue. 01/26/21: No new wounds, pressure relief ongoing. Wound/Skin Impairment Nursing Diagnoses: Impaired tissue integrity Goals: Patient/caregiver will verbalize understanding of skin care regimen Date Initiated: 11/10/2020 Target Resolution Date: 08/27/2023 Goal Status: Active Ulcer/skin breakdown will have a volume reduction of 30% by week 4 Date Initiated: 11/10/2020 Date Inactivated: 12/22/2020 Target Resolution Date: 12/08/2020 Goal Status: Met Ulcer/skin breakdown will have a volume reduction of 50% by week 8 Date Initiated: 12/22/2020 Date Inactivated: 01/26/2021 Target Resolution Date: 01/12/2021 [name], [name] ([id number]) [id number].pdf Page 4 of 7 Goal Status: Met Ulcer/skin breakdown will have a volume reduction of 80% by week 12 Date Initiated: 01/26/2021 Date Inactivated: 03/01/2021 Target Resolution Date: 02/23/2021 Unmet Reason: see wound Goal Status: Unmet measurements. Interventions: Assess patient/caregiver ability to obtain necessary supplies Assess patient/caregiver ability to perform ulcer/skin care regimen upon  admission and as needed Assess ulceration(s) every visit Provide education on ulcer and skin care Treatment Activities: Skin care regimen initiated : 11/10/2020 Topical wound management initiated : 11/10/2020 Notes: 01/26/21: Wounds greater than 50% volume reduction, new goal initiated. Electronic Signature(s) Signed: 07/30/2023 5:22:11 PM By: Sarah Deed RN, BSN Entered By: [name] on 07/30/2023 10:51:35 -------------------------------------------------------------------------------- Pain Assessment Details Patient Name: Date of Service: Sarah Hose D. 07/30/2023 10:30 A M Medical Record Number: [id number] Patient Account Number: [id number] Date of Birth/Sex: Treating RN: 03-29-1941 (82 y.o. [name] Primary Care Emanuell Morina: [name] Other Clinician: Referring Tamber Burtch: Treating Koryn Charlot/Extender: [name] in Treatment: [phone] Active Problems Location of Pain Severity and Description of Pain Patient Has Paino No Site Locations Rate the pain. Current Pain Level: 0 Pain Management and Medication Current Pain Management: Electronic Signature(s) Signed: 07/30/2023 5:22:11 PM By: Sarah Deed RN, BSN Entered By: [name] on 07/30/2023 10:40:40 [name], [name] ([id number]) [phone].pdf Page 5 of 7 -------------------------------------------------------------------------------- Patient/Caregiver Education Details Patient Name: Date of Service: JAILIN, KLOEPPEL 12/18/2024andnbsp10:30 A M Medical Record Number: [id number] Patient Account Number: [id number] Date of Birth/Gender: Treating RN: 1940/11/02 (82 y.o. [name] Primary Care Physician: [name] Other  Clinician: Referring Physician: Treating Physician/Extender: [name] in Treatment: [phone] Education Assessment Education Provided To: Patient Education Topics Provided Pressure: Methods:  Explain/Verbal Responses: Reinforcements needed, State content correctly Wound/Skin Impairment: Methods: Explain/Verbal Responses: Reinforcements needed, State content correctly Electronic Signature(s) Signed: 07/30/2023 5:22:11 PM By: Sarah Deed RN, BSN Entered By: [name] on 07/30/2023 10:51:53 -------------------------------------------------------------------------------- Wound Assessment Details Patient Name: Date of Service: Sarah Hose D. 07/30/2023 10:30 A M Medical Record Number: [id number] Patient Account Number: [id number] Date of Birth/Sex: Treating RN: Sep 05, 1940 (82 y.o. [name] Primary Care Coty Larsh: [name] Other Clinician: Referring Kacin Dancy: Treating Fatumata Kashani/Extender: [name] in Treatment: 141 Wound Status Wound Number: 5 Primary Etiology: Pressure Ulcer Wound Location: Left Gluteus Wound Status: Open Wounding Event: Gradually Appeared Comorbid History: Hypertension, Dementia Date Acquired: 09/12/2020 Weeks Of Treatment: 141 Clustered Wound: No Photos [name], [name] ([id number]) [phone].pdf Page 6 of 7 Wound Measurements Length: (cm) 0.2 Width: (cm) 0.2 Depth: (cm) 0.1 Area: (cm) 0.031 Volume: (cm) 0.003 % Reduction in Area: 98.7% % Reduction in Volume: 99.9% Epithelialization: Large (67-100%) Tunneling: No Undermining: No Wound Description Classification: Category/Stage III Wound Margin: Epibole Exudate Amount: Small Exudate Type: Serosanguineous Exudate Color: red, brown Foul Odor After Cleansing: No Slough/Fibrino No Wound Bed Granulation Amount: Large (67-100%) Exposed Structure Granulation Quality: Red, Pink Fascia Exposed: No Necrotic Amount: None Present (0%) Fat Layer (Subcutaneous Tissue) Exposed: Yes Tendon Exposed: No Muscle Exposed: No Joint Exposed: No Bone Exposed: No Periwound Skin Texture Texture Color No Abnormalities Noted:  Yes No Abnormalities Noted: Yes Moisture Temperature / Pain No Abnormalities Noted: Yes Temperature: No Abnormality Electronic Signature(s) Signed: 07/30/2023 5:22:11 PM By: Sarah Deed RN, BSN Entered By: [name] on 07/30/2023 10:46:58 -------------------------------------------------------------------------------- Wound Assessment Details Patient Name: Date of Service: Sarah Hose D. 07/30/2023 10:30 A M Medical Record Number: [id number] Patient Account Number: [id number] Date of Birth/Sex: Treating RN: 1940-11-17 (82 y.o. [name] Primary Care Shiva Karis: [name] Other Clinician: Referring Tekesha Almgren: Treating Maddyx Wieck/Extender: [name] in Treatment: 141 Wound Status Wound Number: 8 Primary Etiology: Pressure Ulcer Wound Location: Left T Second oe Wound Status: Open Wounding Event: Pressure Injury Comorbid History: Hypertension, Dementia Date Acquired: 04/15/2023 Weeks Of Treatment: 14 Clustered Wound: No Photos [name], [name] ([id number]) [id number] Page 7 of 7 Wound Measurements Length: (cm) 0.1 Width: (cm) 0.1 Depth: (cm) 0.1 Area: (cm) 0.008 Volume: (cm) 0.001 % Reduction in Area: 88.7% % Reduction in Volume: 85.7% Epithelialization: Large (67-100%) Tunneling: No Undermining: No Wound Description Classification: Unstageable/Unclassified Wound Margin: Flat and Intact Exudate Amount: None Present Foul Odor After Cleansing: No Slough/Fibrino Yes Wound Bed Granulation Amount: None Present (0%) Exposed Structure Necrotic Amount: Small (1-33%) Fascia Exposed: No Necrotic Quality: Eschar Fat Layer (Subcutaneous Tissue) Exposed: Yes Tendon Exposed: No Muscle Exposed: No Joint Exposed: No Bone Exposed: No Periwound Skin Texture Texture Color No Abnormalities Noted: Yes No Abnormalities Noted: Yes Moisture Temperature / Pain No Abnormalities Noted: Yes Temperature: No  Abnormality Electronic Signature(s) Signed: 07/30/2023 5:22:11 PM By: Sarah Deed RN, BSN Entered By: [name] on 07/30/2023 10:47:20 -------------------------------------------------------------------------------- Vitals Details Patient Name: Date of Service: Sarah Hose D. 07/30/2023 10:30 A M Medical Record Number: [id number] Patient Account Number: [id number] Date of Birth/Sex: Treating RN: [birth year] (82 y.o. [name] Primary Care Gavinn Collard: [name] Other Clinician: Referring Krishna Dancel: Treating Melda Mermelstein/Extender: [name] in Treatment: 141 Vital Signs Time Taken: 10:15 Temperature (F): 97.9 Pulse (bpm): 50 Respiratory Rate (breaths/min): 18  Blood Pressure (mmHg): 136/62 Reference Range: 80 - 120 mg / dl Airway Pulse Oximetry (%): 96 Electronic Signature(s) Signed: 07/30/2023 5:22:11 PM By: Sarah Deed RN, BSN Entered By: [name] on 07/30/2023 10:39:34

## 2023-07-31 NOTE — Telephone Encounter (Signed)
Spoke with son regarding patient Per son patient is bed bound and has HHN that comes out once a week  Yesterday when nurse was there seeing patient HR was 70 Son stated patient feels fine.  Explained to son would be hard to do virtual visit without doing anything like an EKG After reviewing chart patient had loop implant 02/21/2020 Reached out to device team, no recordings Spoke with Kem K CMA with device. Per Evans Lance, patient was never registered secondary to her not being notified it was placed She has reached out to [employer] regarding doing a safety zone.  Kem did get patient enrolled today and will start getting data tomorrow if device working Discussed with son and the light on transmission device is green and always has been.   Discussed with Dr Cristal Deer and ok to do virtual visit, not urgent if patient is feeling ok   Spoke with son again and schedule visit and advised if any changes or the color on transmission device changes to call back

## 2023-07-31 NOTE — Telephone Encounter (Signed)
Patient's son is calling wanting to reestablish the patient with Dr. Cristal Deer due to recent bradycardia in the 50's. He is requesting the appointment be virtual due to the patient being bed ridden with a home health nurse that comes out.   Last appt was in 2021 as a virtual visit.  Please advise.

## 2023-08-01 NOTE — Telephone Encounter (Signed)
Received message from [facility] regarding loop  We did get a Transmission from the patient this morning and she has not had any episode on there since implant. Everything is normal on her loop so far.   She did set parameters today to start showing bradycardia  Dr Cristal Deer aware

## 2023-08-26 ENCOUNTER — Telehealth (HOSPITAL_BASED_OUTPATIENT_CLINIC_OR_DEPARTMENT_OTHER): Payer: Medicare Other | Admitting: Cardiology

## 2023-08-26 ENCOUNTER — Telehealth (HOSPITAL_BASED_OUTPATIENT_CLINIC_OR_DEPARTMENT_OTHER): Payer: Self-pay

## 2023-08-26 NOTE — Telephone Encounter (Signed)
 Called pt at 2:25 at the [phone] # and no answer, called again at 2:26 and still no answer. Called the [phone], at 2:29, and it went straight to VM, left VM that I was calling for the telephone visit.

## 2023-08-27 ENCOUNTER — Encounter (HOSPITAL_BASED_OUTPATIENT_CLINIC_OR_DEPARTMENT_OTHER): Payer: Medicare Other | Attending: General Surgery | Admitting: General Surgery

## 2023-08-27 DIAGNOSIS — Z86718 Personal history of other venous thrombosis and embolism: Secondary | ICD-10-CM | POA: Insufficient documentation

## 2023-08-27 DIAGNOSIS — I428 Other cardiomyopathies: Secondary | ICD-10-CM | POA: Diagnosis not present

## 2023-08-27 DIAGNOSIS — L97522 Non-pressure chronic ulcer of other part of left foot with fat layer exposed: Secondary | ICD-10-CM | POA: Diagnosis not present

## 2023-08-27 DIAGNOSIS — I10 Essential (primary) hypertension: Secondary | ICD-10-CM | POA: Insufficient documentation

## 2023-08-27 DIAGNOSIS — L89103 Pressure ulcer of unspecified part of back, stage 3: Secondary | ICD-10-CM | POA: Diagnosis present

## 2023-08-28 NOTE — Progress Notes (Addendum)
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 packing this with plain gauze packing strips. The tunnel is about the same length, roughly 3 cm. 03/20/2022: Telehealth visit with the patient via her home health nurse. The case manager and I participated in the call. New images were provided. The wound on her back is much smaller and we are packing this with silver alginate. She continues to have drainage  from the buttock wound; the orifice is quite narrow and they are packing this with iodoform gauze packing strips. The tunnel is about the same length, roughly 3 cm. The nurse says the drainage is clearing and is more serosanguineous rather than purulent. 04/17/2022: Telehealth visit with the patient via her home health nurse. The case manager and I participated on the call from our office in the clinic. The wound on her back is closed. The buttock wound has a purpleish fluctuant area just adjacent to it. There is still tunneling present and the drainage is serosanguineous. 05/15/2022: T elehealth visit with the patient via her home health nurse, Sarah. The case manager and I participated in the call from our office in the clinic. Since our last visit, the patient developed 2 areas adjacent to her back wound that look like blood blisters. Maralyn Sago had contacted [place] at the end of last week and we advised applying silver alginate to both sites. She says that they are stable and do not seem to be progressing. Photographs provided by Maralyn Sago demonstrate this. The area of purpleish discoloration adjacent to the buttock wound has started to open, as we had feared. The nature of the drainage from the tunnel has also changed. It is more seropurulent than mucopurulent. The patient also expressed some pain today when the wound was being probed for depth. 06/19/2022: T elehealth visit with the patient via her home health nurse Sarah. The case manager and I participated in the call from our office in the  wound care center. The patient and her nurse were at the patient's home. The blood blisters on the patient's back have dried and flattened out. The area of purpleish discoloration adjacent to the buttock wound has now basically formed tunneling area that seems to connect to the old tunnel. The drainage is serosanguineous rather than purulent. It is about 2.5 cm, down from 3.5 cm. Photographs of both sites were provided by the  patient's nurse. 07/17/2022: T elehealth visit with the patient via her home health nurse Sarah. The case manager and I participated in the call from our office in the wound care center. The patient and her nurse were at the patient's home. The buttock wound has enlarged and based on the photos provided by Sarah, the surface of the wound is clean. Maralyn Sago reports that the drainage is serosanguineous and moderate in amount without any purulence. The patient has developed some blood blister-like lesions on her sacrum and they have been applying a foam border dressing to this area. 08/21/2022: Telehealth visit with the patient via her home health nurse Sarah. The case manager and I participated in the call from our office in the wound care center. The patient and her nurse were at the patient's home. Photos were provided by Maralyn Sago. The ulcer on the patient's buttock has contracted considerably and is much shallower. The periwound purple discoloration has also decreased markedly. Minimal drainage reported from the wound. 09/18/2022: T elehealth visit with the patient via her home health nurse Sarah. The case manager and I participated in the call from our office in the wound care center. The patient and her nurse were at the patient's home. Photos were provided by Maralyn Sago. The buttock ulcer is smaller again as of this visit. Maralyn Sago reports that the drainage is thinner and less copious. She has a couple of small purple marks adjacent to the wound that Maralyn Sago believes are secondary to tape irritation. 10/16/2022: Telehealth visit with the patient via her home health nurse Sarah. The case manager and I participated in the call from our office in the wound care center. The patient and her nurse were at the patient's home. Photos were provided by Maralyn Sago. The buttock ulcer is shallower, but there is a "squishy area" adjacent that Maralyn Sago concerned may break open. Maralyn Sago reports that the drainage is looking a little bit more  purulent. The purple marks that were present last time are no longer there. 11/13/2022: Telehealth visit with the patient via her home health nurse Sarah. The case manager and I participated in the call from our office in the wound care center. The patient and her nurse were at the patient's home. Photos were provided by Maralyn Sago. A small pinpoint opening in the "squishy area" that Maralyn Sago was concerned about last visit did end up breaking open and is draining some seropurulent fluid. Otherwise there have been no significant changes. 01/01/2023: Telehealth visit with the patient via her home health nurse Sarah. The case manager and I participated in the call from our office in the wound care center. The patient and her nurse were at the patient's home. Photos were provided by Maralyn Sago. The pinpoint area that had opened during our last visit has now closed again. The tunnel that has been getting packed is smaller and closing up. 02/12/2023: This is a telehealth visit with the patient via her home health nurse Sarah. The case manager and I participated in the call from our office in the wound care center. The patient and her nurse  were at the patient's home. Since our last visit, the patient was hospitalized with altered mental status and was found to be septic. Her wounds were not thought to be the source of sepsis, but rather some cellulitis in her arm. She responded well to antibiotics and is now home again. Maralyn Sago reports that the wound in her back reopened just a bit while she was in the hospital. We are still packing the tunnel on her buttock. 04/23/2023: This is a telehealth visit with the patient via her home health nurse Sarah. The case manager and I participated in the call from our office in the wound care center. The patient and her nurse were in the patient's home. Since our last visit, the buttock ulcer has healed. The wound on her back is down to just a small pinhole. She has a new wound on the knuckle  of her left third toe. The patient's PCP had them applying Neosporin and said that the wound appeared to be closed, but based upon the photographs provided, there still appears to be an open area underneath a layer of dry eschar. 05/21/2023: This is a telehealth visit with the patient via her home health nurse Sarah. The case manager and I participated in the call from our office in the wound care center. The patient and her nurse were in the patient's home. The buttock ulcer remains closed. The wound on her back is so small that they can no longer pack any dressing into the cavity. The wound on her left third toe is smaller but still fairly dry, despite using Medihoney. 06/18/2023: This is a telehealth visit with the patient via her home health nurse Sarah. The case manager and I participated in the call from our office in the wound care center. The patient and her nurse were in the patient's home. The wound on her back is small and shallow. The wound on her left third toe is nearly closed. 07/30/2023: This is a telehealth visit with the patient via her home health nurse, Maralyn Sago. The case manager and I participated in the call from our office in the wound care center. The patient and her nurse were in the patient's home. The wound on her back is down to just a shallow, narrow slit. The wound on her left third toe just has a scab on it. 08/27/2023: This is a telehealth visit with the patient via her home health nurse, Maralyn Sago. The case manager and I participated in the call from our office in the wound care center. The patient, her son and her nurse were in the patient's home. The wound on her back is stable, without any real change. There appears to be just a pinhole opening in her third toe wound. Pascoe, Sharin Grave ([id number]) [id number] Page 9 of 10 Objective Constitutional Vitals Time Taken: 10:30 AM, Temperature: 97.9 F, Pulse: 60 bpm, Respiratory Rate: 18 breaths/min,  Blood Pressure: 126/62 mmHg, Pulse Oximetry: 95 %. Integumentary (Hair, Skin) Wound #5 status is Open. Original cause of wound was Gradually Appeared. The date acquired was: 09/12/2020. The wound has been in treatment 145 weeks. The wound is located on the Left Gluteus. The wound measures 0.2cm length x 0.2cm width x 0.2cm depth; 0.031cm^2 area and 0.006cm^3 volume. There is Fat Layer (Subcutaneous Tissue) exposed. There is no tunneling or undermining noted. There is a small amount of serosanguineous drainage noted. The wound margin is epibole. There is large (67-100%) red, pink granulation within the wound bed. There is no necrotic  tissue within the wound bed. The periwound skin appearance had no abnormalities noted for texture. The periwound skin appearance had no abnormalities noted for moisture. The periwound skin appearance had no abnormalities noted for color. Periwound temperature was noted as No Abnormality. The periwound has tenderness on palpation. Wound #8 status is Open. Original cause of wound was Pressure Injury. The date acquired was: 04/15/2023. The wound has been in treatment 18 weeks. The wound is located on the Left T Second. The wound measures 0.1cm length x 0.1cm width x 0.1cm depth; 0.008cm^2 area and 0.001cm^3 volume. There is oe Fat Layer (Subcutaneous Tissue) exposed. There is no tunneling or undermining noted. There is a none present amount of drainage noted. The wound margin is flat and intact. There is no granulation within the wound bed. There is no necrotic tissue within the wound bed. The periwound skin appearance had no abnormalities noted for texture. The periwound skin appearance had no abnormalities noted for moisture. The periwound skin appearance had no abnormalities noted for color. Periwound temperature was noted as No Abnormality. 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 - Telehealth Wed.09/24/23 @ 10:30 am Bathing/ Shower/ Hygiene: May shower and wash wound with soap and water. Off-Loading: Low air-loss mattress (Group 2) Turn and reposition every 2 hours Additional Orders / Instructions: Follow Nutritious Diet - Continue Juven and Nutrin Home Health: New wound care orders this week; continue Home Health for wound care. May utilize formulary equivalent dressing for wound treatment orders unless otherwise specified. Other Home Health Orders/Instructions: - [facility] for Wound Care 3x week. [place] Dec [phone] (cell) WOUND #5: - Gluteus Wound Laterality: Left Cleanser: Wound Cleanser (Home Health) 3 x Per Week/30 Days Discharge Instructions: Cleanse the wound with wound cleanser prior to applying a clean dressing using gauze sponges, not tissue or cotton balls. Prim Dressing: Promogran Prisma Matrix, 4.34 (sq in) (silver collagen) 3 x Per Week/30 Days ary Discharge Instructions: Moisten collagen with saline or hydrogel Secondary Dressing: Zetuvit Plus Silicone Border Dressing 4x4 (in/in) (Home Health) 3 x Per Week/30 Days Discharge Instructions: Apply silicone border or abd pad with tape over primary dressing as directed. WOUND #8: - T Second Wound Laterality: Left oe Cleanser: Soap and Water 3 x Per Week/30 Days Discharge Instructions: May shower and wash wound with dial antibacterial soap and water prior to dressing change. Prim Dressing: Maxorb Extra Ag+ Alginate Dressing, 2x2 (in/in) 3 x Per Week/30 Days ary Discharge Instructions: Apply to wound bed as instructed Secondary Dressing: Woven Gauze Sponges 2x2 in 3 x Per Week/30 Days Discharge Instructions: Apply over primary dressing as directed. Secured With: Insurance underwriter, Sterile 2x75 (in/in) 3 x Per Week/30 Days Discharge Instructions: Secure with stretch gauze as  directed. 08/27/2023: We used teleconferencing to view the wounds The wound on her back is stable, without any real change. There is a bit of old-looking blood on the dressing and from what I can see, the edges of the opening appear fairly fibrotic. There appears to be just a pinhole opening in her third toe wound. The toe apparently scabs over and then when she is being bathed, there is just a tiny amount of serosanguinous drainage Unfortunately, the patient is unable to transport to the clinic, as I think the back wound probably has a fibrotic tract that would benefit from debridement.  We instructed the home health nurse to mechanically debride the site with a Q-tip and some gauze and hopefully this will be enough to stimulate the surface to close. I am also going to change this dressing to Prisma silver collagen to see if we can make any improvement in that regard. We have discontinued the Medihoney on the toe wound and will use silver alginate, instead. We will have a follow-up telehealth visit in 1 month. Kirby, Sharin Grave ([id number]) [id number] Page 10 of 10 Electronic Signature(s) Signed: 08/27/2023 5:36:28 PM By: Zenaida Deed RN, BSN Signed: 08/28/2023 7:45:59 AM By: Duanne Guess MD FACS Previous Signature: 08/27/2023 11:13:34 AM Version By: Duanne Guess MD FACS Entered By: Zenaida Deed on 08/27/2023 16:47:21 -------------------------------------------------------------------------------- SuperBill Details Patient Name: Date of Service: Sarah Hose D. 08/27/2023 Medical Record Number: [id number] Patient Account Number: [id number] Date of Birth/Sex: Treating RN: [birth year] (83 y.o. [name] Primary Care Provider: [name] Other Clinician: Referring Provider: Treating Provider/Extender: Bud Face in Treatment: 145 Diagnosis Coding ICD-10 Codes Code Description L89.103 Pressure ulcer of unspecified  part of back, stage 3 L97.522 Non-pressure chronic ulcer of other part of left foot with fat layer exposed Facility Procedures : CPT4 Code: [id number] Description: Telehealth originating site facility fee. 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

## 2023-08-28 NOTE — Progress Notes (Signed)
Llera, Sharin Grave ([id number]) [id number] Page 1 of 6 Visit Report for 08/27/2023 Arrival Information Details Patient Name: Date of Service: TERAJI, PALUCK 08/27/2023 10:30 A M Medical Record Number: [id number] Patient Account Number: [id number] Date of Birth/Sex: Treating RN: March 16, 1941 (83 y.o. [name] Primary Care Meggen Spaziani: [name] Other Clinician: Referring Rakeen Gaillard: Treating Ahmarion Saraceno/Extender: Bud Face in Treatment: 145 Visit Information History Since Last Visit Added or deleted any medications: No Patient Arrived: Other Any new allergies or adverse reactions: No Arrival Time: 10:29 Had a fall or experienced change in No Accompanied By: son, HHRN activities of daily living that may affect Transfer Assistance: None risk of falls: Patient Identification Verified: Yes Signs or symptoms of abuse/neglect since last visito No Secondary Verification Process Completed: Yes Hospitalized since last visit: No Patient Requires Transmission-Based Precautions: No Implantable device outside of the clinic excluding No Patient Has Alerts: No cellular tissue based products placed in the center since last visit: Has Dressing in Place as Prescribed: Yes Pain Present Now: No Notes telehealth visit Electronic Signature(s) Signed: 08/27/2023 5:36:28 PM By: Zenaida Deed RN, BSN Entered By: Zenaida Deed on 08/27/2023 10:30:08 -------------------------------------------------------------------------------- Lower Extremity Assessment Details Patient Name: Date of Service: [name] 08/27/2023 10:30 A M Medical Record Number: [id number] Patient Account Number: [id number] Date of Birth/Sex: Treating RN: [birth year] (83 y.o. [name] Primary Care Orvil Faraone: [name] Other Clinician: Referring Ellysa Parrack: Treating Fawne Hughley/Extender: Bud Face in Treatment:  145 Electronic Signature(s) Signed: 08/27/2023 5:36:28 PM By: Zenaida Deed RN, BSN Entered By: Zenaida Deed on 08/27/2023 10:41:36 -------------------------------------------------------------------------------- Multi Wound Chart Details Patient Name: Date of Service: [name] 08/27/2023 10:30 A M Medical Record Number: [id number] Patient Account Number: [id number] Date of Birth/Sex: Treating RN: 11-15-1940 (83 y.o. [address] Sharin Grave ([id number]) [phone].pdf Page 2 of 6 Primary Care Brooks Stotz: [name] Other Clinician: Referring Sulay Brymer: Treating Lamarius Dirr/Extender: Bud Face in Treatment: 145 [5:Photos: No Photos Left Gluteus Wound Location: Gradually Appeared Wounding Event: Pressure Ulcer Primary Etiology: Hypertension, Dementia Comorbid History: 09/12/2020 Date Acquired: 145 Weeks of Treatment: Open Wound Status: No Wound Recurrence: 0.2x0.2x0.2  Measurements L x W x D (cm) 0.031 A (cm) : rea 0.006 Volume (cm) : 98.70% % Reduction in A rea: 99.70% % Reduction in Volume: Category/Stage III Classification: Small Exudate A mount: Serosanguineous Exudate Type: red, brown Exudate Color: Epibole Wound  Margin: Large (67-100%) Granulation A mount: Red, Pink Granulation Quality: None Present (0%) Necrotic A mount: Fat Layer (Subcutaneous Tissue): Yes Fat Layer (Subcutaneous Tissue): Yes N/A Exposed Structures: Fascia: No Tendon: No Muscle: No Joint: No  Bone: No Large (67-100%) Epithelialization: No Abnormalities Noted Periwound Skin Texture: No Abnormalities Noted Periwound Skin Moisture: No Abnormalities Noted Periwound Skin Color: No Abnormality Temperature: Yes Tenderness on Palpation:] [8:No Photos  Left T Second oe Pressure Injury Pressure Ulcer Hypertension, Dementia 04/15/2023 18 Open No 0.1x0.1x0.1 0.008 0.001 88.70% 85.70% Category/Stage III None Present N/A N/A Flat and Intact None Present (0%) N/A None Present (0%)  Fascia: No Tendon: No  Muscle: No Joint: No Bone: No Large (67-100%) No Abnormalities Noted No Abnormalities Noted No Abnormalities Noted No Abnormality N/A] [N/A:N/A N/A N/A N/A N/A N/A N/A N/A N/A N/A N/A N/A N/A N/A N/A N/A N/A N/A N/A N/A N/A N/A N/A N/A N/A N/A N/A N/A] Treatment Notes Electronic Signature(s) Signed: 08/27/2023 11:05:33 AM By: Duanne Guess MD FACS Entered By: Duanne Guess on 08/27/2023 11:05:33 -------------------------------------------------------------------------------- Multi-Disciplinary Care Plan Details Patient Name:  Date of Service: AFIFA, NESSER 08/27/2023 10:30 A M Medical Record Number: [id number] Patient Account Number: [id number] Date of Birth/Sex: Treating RN: 12-02-1940 (83 y.o. [name] Primary Care Trenden Hazelrigg: [name] Other Clinician: Referring Willet Schleifer: Treating Zyionna Pesce/Extender: Bud Face in Treatment: [phone] Multidisciplinary Care Plan reviewed with physician Active Inactive Pressure Nursing Diagnoses: Knowledge deficit related to causes and risk factors for pressure ulcer development Goals: Patient will remain free from development of additional pressure ulcers Date Initiated: 11/10/2020 Target Resolution Date: 09/24/2023 VIDUSHI, SABEY ([id number]) [phone].pdf Page 3 of 6 Goal Status: Active Interventions: Assess: immobility, friction, shearing, incontinence upon admission and as needed Assess offloading mechanisms upon admission and as needed Assess potential for pressure ulcer upon admission and as needed Provide education on pressure ulcers Treatment Activities: Patient referred for home evaluation of offloading devices/mattresses : 11/10/2020 Notes: 12/22/20 Pressure relief ongoing issue. 01/26/21: No new wounds, pressure relief ongoing. Wound/Skin Impairment Nursing Diagnoses: Impaired tissue integrity Goals: Patient/caregiver will verbalize understanding  of skin care regimen Date Initiated: 11/10/2020 Target Resolution Date: 09/24/2023 Goal Status: Active Ulcer/skin breakdown will have a volume reduction of 30% by week 4 Date Initiated: 11/10/2020 Date Inactivated: 12/22/2020 Target Resolution Date: 12/08/2020 Goal Status: Met Ulcer/skin breakdown will have a volume reduction of 50% by week 8 Date Initiated: 12/22/2020 Date Inactivated: 01/26/2021 Target Resolution Date: 01/12/2021 Goal Status: Met Ulcer/skin breakdown will have a volume reduction of 80% by week 12 Date Initiated: 01/26/2021 Date Inactivated: 03/01/2021 Target Resolution Date: 02/23/2021 Unmet Reason: see wound Goal Status: Unmet measurements. Interventions: Assess patient/caregiver ability to obtain necessary supplies Assess patient/caregiver ability to perform ulcer/skin care regimen upon admission and as needed Assess ulceration(s) every visit Provide education on ulcer and skin care Treatment Activities: Skin care regimen initiated : 11/10/2020 Topical wound management initiated : 11/10/2020 Notes: 01/26/21: Wounds greater than 50% volume reduction, new goal initiated. Electronic Signature(s) Signed: 08/27/2023 5:36:28 PM By: Zenaida Deed RN, BSN Entered By: Zenaida Deed on 08/27/2023 10:28:25 -------------------------------------------------------------------------------- Pain Assessment Details Patient Name: Date of Service: [name] 08/27/2023 10:30 A M Medical Record Number: [id number] Patient Account Number: [id number] Date of Birth/Sex: Treating RN: [birth year] (83 y.o. [name] Primary Care Raylyn Speckman: [name] Other Clinician: Referring Nikcole Eischeid: Treating Matteo Banke/Extender: Bud Face in Treatment: [phone] Active Problems Location of Pain Severity and Description of Pain Patient Has Paino No Site Locations Rate the pain. Janssens, Sharin Grave ([id number]) [id number] Page 4 of 6 Rate the  pain. Current Pain Level: 0 Pain Management and Medication Current Pain Management: Electronic Signature(s) Signed: 08/27/2023 5:36:28 PM By: Zenaida Deed RN, BSN Entered By: Zenaida Deed on 08/27/2023 10:41:29 -------------------------------------------------------------------------------- Patient/Caregiver Education Details Patient Name: Date of Service: [name] 1/15/2025andnbsp10:30 A M Medical Record Number: [id number] Patient Account Number: [id number] Date of Birth/Gender: Treating RN: 1940-11-17 (83 y.o. [name] Primary Care Physician: [name] Other Clinician: Referring Physician: Treating Physician/Extender: Bud Face in Treatment: [phone] Education Assessment Education Provided To: Caregiver son Education Topics Provided Pressure: Methods: Explain/Verbal Responses: Reinforcements needed, State content correctly Wound/Skin Impairment: Methods: Explain/Verbal Responses: Reinforcements needed, State content correctly Electronic Signature(s) Signed: 08/27/2023 5:36:28 PM By: Zenaida Deed RN, BSN Entered By: Zenaida Deed on 08/27/2023 10:29:01 -------------------------------------------------------------------------------- Wound Assessment Details Patient Name: Date of Service: [name] 08/27/2023 10:30 A M Wahlstrom, Sharin Grave ([id number]) [phone].pdf Page 5 of 6 Medical Record Number: [id number] Patient Account Number: [id number] Date of Birth/Sex: Treating RN: 1941-07-29 (83 y.o. F) Boehlein,  Linda Primary Care Kedra Mcglade: [name] Other Clinician: Referring Emrik Erhard: Treating Katheen Aslin/Extender: Bud Face in Treatment: 145 Wound Status Wound Number: 5 Primary Etiology: Pressure Ulcer Wound Location: Left Gluteus Wound Status: Open Wounding Event: Gradually Appeared Comorbid History: Hypertension, Dementia Date Acquired: 09/12/2020 Weeks  Of Treatment: 145 Clustered Wound: No Wound Measurements Length: (cm) 0.2 Width: (cm) 0.2 Depth: (cm) 0.2 Area: (cm) 0.031 Volume: (cm) 0.006 % Reduction in Area: 98.7% % Reduction in Volume: 99.7% Epithelialization: Large (67-100%) Tunneling: No Undermining: No Wound Description Classification: Category/Stage III Wound Margin: Epibole Exudate Amount: Small Exudate Type: Serosanguineous Exudate Color: red, brown Foul Odor After Cleansing: No Slough/Fibrino No Wound Bed Granulation Amount: Large (67-100%) Exposed Structure Granulation Quality: Red, Pink Fascia Exposed: No Necrotic Amount: None Present (0%) Fat Layer (Subcutaneous Tissue) Exposed: Yes Tendon Exposed: No Muscle Exposed: No Joint Exposed: No Bone Exposed: No Periwound Skin Texture Texture Color No Abnormalities Noted: Yes No Abnormalities Noted: Yes Moisture Temperature / Pain No Abnormalities Noted: Yes Temperature: No Abnormality Tenderness on Palpation: Yes Electronic Signature(s) Signed: 08/27/2023 5:36:28 PM By: Zenaida Deed RN, BSN Entered By: Zenaida Deed on 08/27/2023 10:42:17 -------------------------------------------------------------------------------- Wound Assessment Details Patient Name: Date of Service: [name] 08/27/2023 10:30 A M Medical Record Number: [id number] Patient Account Number: [id number] Date of Birth/Sex: Treating RN: 12/31/1940 (83 y.o. [name] Primary Care Filiberto Wamble: [name] Other Clinician: Referring Tanicia Wolaver: Treating Timothee Gali/Extender: Bud Face in Treatment: 145 Wound Status Wound Number: 8 Primary Etiology: Pressure Ulcer Wound Location: Left T Second oe Wound Status: Open Wounding Event: Pressure Injury Comorbid History: Hypertension, Dementia Date Acquired: 04/15/2023 Weeks Of Treatment: 18 Clustered Wound: No Benard, Sharin Grave ([id number]) [phone].pdf Page 6 of 6 Wound  Measurements Length: (cm) 0.1 Width: (cm) 0.1 Depth: (cm) 0.1 Area: (cm) 0.008 Volume: (cm) 0.001 % Reduction in Area: 88.7% % Reduction in Volume: 85.7% Epithelialization: Large (67-100%) Tunneling: No Undermining: No Wound Description Classification: Category/Stage III Wound Margin: Flat and Intact Exudate Amount: None Present Foul Odor After Cleansing: No Slough/Fibrino Yes Wound Bed Granulation Amount: None Present (0%) Exposed Structure Necrotic Amount: None Present (0%) Fascia Exposed: No Fat Layer (Subcutaneous Tissue) Exposed: Yes Tendon Exposed: No Muscle Exposed: No Joint Exposed: No Bone Exposed: No Periwound Skin Texture Texture Color No Abnormalities Noted: Yes No Abnormalities Noted: Yes Moisture Temperature / Pain No Abnormalities Noted: Yes Temperature: No Abnormality Electronic Signature(s) Signed: 08/27/2023 5:36:28 PM By: Zenaida Deed RN, BSN Entered By: Zenaida Deed on 08/27/2023 10:43:06 -------------------------------------------------------------------------------- Vitals Details Patient Name: Date of Service: [name] 08/27/2023 10:30 A M Medical Record Number: [id number] Patient Account Number: [id number] Date of Birth/Sex: Treating RN: [birth year] (83 y.o. [name] Primary Care Laurenashley Viar: [name] Other Clinician: Referring Denea Cheaney: Treating Cendy Oconnor/Extender: Bud Face in Treatment: 145 Vital Signs Time Taken: 10:30 Temperature (F): 97.9 Pulse (bpm): 60 Respiratory Rate (breaths/min): 18 Blood Pressure (mmHg): 126/62 Reference Range: 80 - 120 mg / dl Airway Pulse Oximetry (%): 95 Inhaled Oxygen Concentration (%): 0 Electronic Signature(s) Signed: 08/27/2023 5:36:28 PM By: Zenaida Deed RN, BSN Entered By: Zenaida Deed on 08/27/2023 16:46:51

## 2023-08-29 ENCOUNTER — Other Ambulatory Visit: Payer: Self-pay

## 2023-08-29 ENCOUNTER — Emergency Department (HOSPITAL_COMMUNITY)
Admission: EM | Admit: 2023-08-29 | Discharge: 2023-08-30 | Disposition: A | Discharge status: Home / Self Care | Payer: Medicare Other | Attending: Emergency Medicine | Admitting: Emergency Medicine

## 2023-08-29 DIAGNOSIS — I10 Essential (primary) hypertension: Secondary | ICD-10-CM | POA: Insufficient documentation

## 2023-08-29 DIAGNOSIS — K6289 Other specified diseases of anus and rectum: Secondary | ICD-10-CM

## 2023-08-29 DIAGNOSIS — Z7901 Long term (current) use of anticoagulants: Secondary | ICD-10-CM | POA: Insufficient documentation

## 2023-08-29 DIAGNOSIS — Z8673 Personal history of transient ischemic attack (TIA), and cerebral infarction without residual deficits: Secondary | ICD-10-CM | POA: Diagnosis not present

## 2023-08-29 DIAGNOSIS — Z79899 Other long term (current) drug therapy: Secondary | ICD-10-CM | POA: Diagnosis not present

## 2023-08-29 DIAGNOSIS — L989 Disorder of the skin and subcutaneous tissue, unspecified: Secondary | ICD-10-CM | POA: Diagnosis present

## 2023-08-29 LAB — CBC
HCT: 51.4 % — ABNORMAL HIGH (ref 36.0–46.0)
Hemoglobin: 16.1 g/dL — ABNORMAL HIGH (ref 12.0–15.0)
MCH: 34.2 pg — ABNORMAL HIGH (ref 26.0–34.0)
MCHC: 31.3 g/dL (ref 30.0–36.0)
MCV: 109.1 fL — ABNORMAL HIGH (ref 80.0–100.0)
Platelets: 91 10*3/uL — ABNORMAL LOW (ref 150–400)
RBC: 4.71 MIL/uL (ref 3.87–5.11)
RDW: 12.9 % (ref 11.5–15.5)
WBC: 7.9 10*3/uL (ref 4.0–10.5)
nRBC: 0 % (ref 0.0–0.2)

## 2023-08-29 LAB — BASIC METABOLIC PANEL
Anion gap: 9 (ref 5–15)
BUN: 28 mg/dL — ABNORMAL HIGH (ref 8–23)
CO2: 28 mmol/L (ref 22–32)
Calcium: 9.3 mg/dL (ref 8.9–10.3)
Chloride: 104 mmol/L (ref 98–111)
Creatinine, Ser: 0.49 mg/dL (ref 0.44–1.00)
GFR, Estimated: >60 mL/min (ref >60)
Glucose, Bld: 86 mg/dL (ref 70–99)
Potassium: 4.1 mmol/L (ref 3.5–5.1)
Sodium: 141 mmol/L (ref 135–145)

## 2023-08-29 MED ORDER — HYDROCERIN EX CREA: 1.0000 | TOPICAL_CREAM | Freq: Two times a day (BID) | CUTANEOUS | Status: DC | PRN

## 2023-08-29 MED ORDER — DOXYCYCLINE HYCLATE 100 MG PO TABS
100.0000 mg | ORAL_TABLET | Freq: Once | ORAL | Status: AC
  Administered 2023-08-29: 100 mg via ORAL
  Filled 2023-08-29: qty 1

## 2023-08-29 MED ORDER — DOXYCYCLINE HYCLATE 100 MG PO TABS: 100.0000 mg | ORAL_TABLET | Freq: Two times a day (BID) | ORAL | 0 refills | Status: AC

## 2023-08-29 MED ORDER — AMLODIPINE BESYLATE 5 MG PO TABS
5.0000 mg | ORAL_TABLET | Freq: Once | ORAL | Status: AC
  Administered 2023-08-29: 5 mg via ORAL
  Filled 2023-08-29: qty 1

## 2023-08-29 NOTE — Discharge Instructions (Addendum)
Contact the radiology department to schedule the feeding tube replacement.  I placed an outpatient order. Take the antibiotics as prescribed.  Follow-up with your doctor to be rechecked.  Your primary care doctor should also be able to help arrange feeding tube replacement if there are any issues.

## 2023-08-29 NOTE — ED Notes (Signed)
Flushed pts Gtube without resistance or incident.

## 2023-08-29 NOTE — ED Notes (Signed)
Contacted [facility] for pt transport. 

## 2023-08-29 NOTE — ED Notes (Signed)
Provider contacted for patient BP 202/84. Patient is up for d/c but nurse inquiring about possibly medicating patient for her BP while she awaits [facility].

## 2023-08-29 NOTE — ED Provider Notes (Signed)
[place] EMERGENCY DEPARTMENT AT [facility] Provider Note   CSN: [id number] Arrival date & time: 08/29/23  1223     History  Chief Complaint  Patient presents with   Groin Pain    [name] is a 83 y.o. female.   Groin Pain     Patient has a history of hypertension hyperlipidemia depression pulmonary embolism Takotsubo cardiomyopathy, stroke, chronic back pain.  Patient states she is bedbound at baseline.  She is not able to walk or sit up.  Patient presents to the ED for evaluation of pain in her perineum.  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  She is followed at the wound care clinic.  Patient presented to the ED today because of increasing pain in her perineum groin area.  Patient states the skin in her area is irritated and sore.  She has not had any fevers.  No urinary symptoms.  Family sent her to the ED for evaluation  Home Medications Prior to Admission medications   Medication Sig Start Date End Date Taking? Authorizing Provider  doxycycline (VIBRA-TABS) 100 MG tablet Take 1 tablet (100 mg total) by mouth 2 (two) times daily. 08/29/23  Yes Linwood Dibbles, MD  acetaminophen (TYLENOL) 500 MG tablet Take 1,000 mg by mouth every 6 (six) hours as needed for moderate pain or fever. Patient not taking: Reported on 03/03/2023    [provider]  albuterol (VENTOLIN HFA) 108 (90 Base) MCG/ACT inhaler Inhale 2 puffs into the lungs every 6 (six) hours as needed for wheezing or shortness of breath.    [provider]  amLODipine (NORVASC) 5 MG tablet Take 1 tablet (5 mg total) by mouth daily. 03/08/23 04/07/23  Zigmund Daniel., MD  atorvastatin (LIPITOR) 20 MG tablet Take 20 mg by mouth at bedtime. 09/15/20   [provider]  BIOTIN PO Take 1 capsule by mouth daily.    [provider]  bisacodyl (DULCOLAX) 10 MG suppository Place 10 mg rectally as needed for moderate constipation.    [provider]   Bisacodyl (FLEET LAXATIVE PO) Take 1 Dose by mouth as needed (constipation).    [provider]  busPIRone (BUSPAR) 30 MG tablet Take 15 mg by mouth 2 (two) times daily. 09/22/20   [provider]  butalbital-acetaminophen-caffeine (FIORICET) 50-325-40 MG tablet Take 0.5 tablets by mouth as needed for headache.    [provider]  cholecalciferol (VITAMIN D3) 25 MCG (1000 UNIT) tablet Take 1,000 Units by mouth daily.    [provider]  clotrimazole-betamethasone (LOTRISONE) cream Apply 1 Application topically 2 (two) times daily. Apply to buttocks    [provider]  cycloSPORINE (RESTASIS) 0.05 % ophthalmic emulsion Place 1 drop into both eyes daily as needed (dry eyes). Patient taking differently: Place 1 drop into both eyes daily as needed (for redness). 02/21/20   Layne Benton, NP  docusate sodium (COLACE) 100 MG capsule Take 200 mg by mouth at bedtime.    [provider]  ELIQUIS 5 MG TABS tablet Take 5 mg by mouth in the morning and at bedtime. 09/26/20   [provider]  ferrous sulfate 325 (65 FE) MG tablet Take 1 tablet (325 mg total) by mouth daily with breakfast. Patient taking differently: Take 325 mg by mouth See admin instructions. Take 1 tablet by mouth every 2 to 3 days 08/06/20   Lewie Chamber, MD  fluticasone [facility]) 50 MCG/ACT nasal spray Place 2 sprays into  both nostrils daily as needed for allergies or rhinitis.    [provider]  furosemide (LASIX) 20 MG tablet Take 10 mg by mouth daily as needed for fluid or edema.    [provider]  gentamicin cream (GARAMYCIN) 0.1 % Apply 1 Application topically See admin instructions. Apply around the feeding tube with every change and every morning with wound dressing change    [provider]  linaclotide (LINZESS) 290 MCG CAPS capsule Take 290 mcg by mouth daily before breakfast.    [provider]  lisinopril (ZESTRIL) 10 MG tablet  Take 2 tablets (20 mg total) by mouth daily. 03/07/23 04/06/23  Zigmund Daniel., MD  memantine (NAMENDA XR) 28 MG CP24 24 hr capsule Take 1 capsule (28 mg total) by mouth daily. 03/09/20   Angiulli, Mcarthur Rossetti, PA-C  Multiple Vitamins-Minerals (CENTRUM SILVER 50+WOMEN) TABS Take 1 tablet by mouth in the morning.    [provider]  nitroGLYCERIN (NITROSTAT) 0.4 MG SL tablet Place 1 tablet (0.4 mg total) under the tongue every 5 (five) minutes as needed for chest pain. 09/17/19   Duke, Roe Rutherford, PA  nystatin powder Apply 1 Application topically See admin instructions. Apply to buttocks 2 times a day (after applying clotrimazole-betamethasone cream)    [provider]  OVER THE COUNTER MEDICATION 1 patch See admin instructions. Calcium Alginate with Antimicrobial Silver Sheet Dressing- Change dressing every morning (left "tunnel")    [provider]  potassium chloride (KLOR-CON) 10 MEQ tablet Take 5 mEq by mouth See admin instructions. Take 5 mEq by mouth once a day only when taking Furosemide 09/19/20   [provider]  vitamin C (ASCORBIC ACID) 500 MG tablet Take 500 mg by mouth daily.    [provider]  zinc gluconate 50 MG tablet Take 50 mg by mouth daily.    [provider]  zolpidem (AMBIEN) 5 MG tablet Take 1 tablet (5 mg total) by mouth at bedtime as needed for sleep (Insomnia). Patient not taking: No sig reported 05/30/20 08/05/20  Swayze, Ava, DO      Allergies    T-pa [alteplase], Penicillins, and Codeine    Review of Systems   Review of Systems  Physical Exam Updated Vital Signs BP (!) 219/84   Pulse (!) 53   Temp 97.6 F (36.4 C)   Resp 16   SpO2 99%  Physical Exam Vitals and nursing note reviewed.  Constitutional:      General: She is not in acute distress.    Appearance: She is well-developed.  HENT:     Head: Normocephalic and atraumatic.     Right Ear: External ear normal.     Left Ear: External ear normal.   Eyes:     General: No scleral icterus.       Right eye: No discharge.        Left eye: No discharge.     Conjunctiva/sclera: Conjunctivae normal.  Neck:     Trachea: No tracheal deviation.  Cardiovascular:     Rate and Rhythm: Normal rate.  Pulmonary:     Effort: Pulmonary effort is normal. No respiratory distress.     Breath sounds: No stridor.  Abdominal:     General: There is no distension.  Genitourinary:    Comments: Patient incontinent of stool and urine.  Patient was cleaned diaper was removed with chaperone at the bedside.  Patient has erythema to the skin in the perineum.  No abscess appreciated, superficial  skin breakdown without signs of deep ulceration Musculoskeletal:        General: No swelling or deformity.     Cervical back: Neck supple.  Skin:    General: Skin is warm and dry.     Findings: No rash.  Neurological:     Mental Status: She is alert. Mental status is at baseline.     Cranial Nerves: No dysarthria or facial asymmetry.     Motor: No seizure activity.     ED Results / Procedures / Treatments   Labs (all labs ordered are listed, but only abnormal results are displayed) Labs Reviewed  CBC - Abnormal; Notable for the following components:      Result Value   Hemoglobin 16.1 (*)    HCT 51.4 (*)    MCV 109.1 (*)    MCH 34.2 (*)    Platelets 91 (*)    All other components within normal limits  BASIC METABOLIC PANEL - Abnormal; Notable for the following components:   BUN 28 (*)    All other components within normal limits    EKG None  Radiology No results found.  Procedures Procedures    Medications Ordered in ED Medications  hydrocerin (EUCERIN) cream 1 Application (has no administration in time range)  doxycycline (VIBRA-TABS) tablet 100 mg (has no administration in time range)  amLODipine (NORVASC) tablet 5 mg (5 mg Oral Given 08/29/23 1416)    ED Course/ Medical Decision Making/ A&P                                 Medical  Decision Making Amount and/or Complexity of Data Reviewed Labs: ordered.  Risk OTC drugs. Prescription drug management.   Patient presented to the ER for evaluation of erythema in her perineum.  Exam primarily seems to be irritated from stool and urine.  There is very superficial skin breakdown.  No evidence of abscess.  Family is concerned about possible cellulitis.  Will start on a course of antibiotics and recommended continuing barrier cream and frequent diaper changes.  Son states that while she was here he was hoping her feeding tube to be changed.  It is functioning properly but he was told it needed to be changed in 6 months.  Discussed with patient this typically arranged as an outpatient.  I will place a follow-up outpatient order for feeding tube exchange.  Patient's feeding tube is currently functioning properly and flushes without difficulty       Final Clinical Impression(s) / ED Diagnoses Final diagnoses:  Irritation of perirectal skin    Rx / DC Orders ED Discharge Orders          Ordered    Feeding tube replacement        08/29/23 1544    doxycycline (VIBRA-TABS) 100 MG tablet  2 times daily        08/29/23 1554              Linwood Dibbles, MD 08/29/23 1556

## 2023-08-29 NOTE — ED Notes (Signed)
Pt had a BM. Pt was cleaned up and carrier cream applied to pts buttocks.

## 2023-08-29 NOTE — ED Notes (Addendum)
Per response from provider Oda Kilts, PA "Looks like this was Dr. Delford Field pt, he is not here anymore but should be ok for discharge as he felt him appropriate to dc. Looks like his blood pressures are elevated throughout the ER stay."  No new orders at this time.

## 2023-08-29 NOTE — ED Notes (Addendum)
Pt son called in upset that his mother is not home yet. Advised pt son that we are waiting on [facility]. Pt son disconnected call.

## 2023-08-29 NOTE — ED Triage Notes (Addendum)
Pt BIBA from home for groin inflammation x2 days. Tender to palpation, red and swollen. Hx of same. Left side deficits from previous stroke, bedbound. Pt son may also want pt's feeding tube changed.   174 palp HR 54 RR 18 96% RA

## 2023-08-29 NOTE — ED Notes (Signed)
Called [facility] to verify that patient is on the list for transportation. Patient is the 5th on the list per [facility].

## 2023-08-30 NOTE — ED Notes (Signed)
[facility] here to transport patient back home

## 2023-08-30 NOTE — ED Notes (Signed)
Patient brief changed and new chuck pad in place before leaving with [facility] to return back home

## 2023-09-05 ENCOUNTER — Ambulatory Visit (INDEPENDENT_AMBULATORY_CARE_PROVIDER_SITE_OTHER): Payer: Medicare Other

## 2023-09-05 DIAGNOSIS — I440 Atrioventricular block, first degree: Secondary | ICD-10-CM

## 2023-09-08 LAB — CUP PACEART REMOTE DEVICE CHECK
Date Time Interrogation Session: 20250124080917
Implantable Pulse Generator Implant Date: 20210712

## 2023-09-24 ENCOUNTER — Encounter (HOSPITAL_BASED_OUTPATIENT_CLINIC_OR_DEPARTMENT_OTHER): Payer: Medicare Other | Attending: General Surgery | Admitting: General Surgery

## 2023-09-24 DIAGNOSIS — I69398 Other sequelae of cerebral infarction: Secondary | ICD-10-CM | POA: Insufficient documentation

## 2023-09-24 DIAGNOSIS — L89103 Pressure ulcer of unspecified part of back, stage 3: Secondary | ICD-10-CM | POA: Diagnosis present

## 2023-09-24 DIAGNOSIS — Z86718 Personal history of other venous thrombosis and embolism: Secondary | ICD-10-CM | POA: Diagnosis not present

## 2023-09-24 DIAGNOSIS — Z7901 Long term (current) use of anticoagulants: Secondary | ICD-10-CM | POA: Insufficient documentation

## 2023-09-27 ENCOUNTER — Emergency Department (HOSPITAL_COMMUNITY): Payer: Medicare Other

## 2023-09-27 ENCOUNTER — Other Ambulatory Visit: Payer: Self-pay

## 2023-09-27 ENCOUNTER — Encounter (HOSPITAL_COMMUNITY): Payer: Self-pay

## 2023-09-27 ENCOUNTER — Emergency Department (HOSPITAL_COMMUNITY)
Admission: EM | Admit: 2023-09-27 | Discharge: 2023-09-27 | Disposition: A | Discharge status: Home / Self Care | Payer: Medicare Other | Attending: Emergency Medicine | Admitting: Emergency Medicine

## 2023-09-27 DIAGNOSIS — Z431 Encounter for attention to gastrostomy: Secondary | ICD-10-CM

## 2023-09-27 DIAGNOSIS — Z8673 Personal history of transient ischemic attack (TIA), and cerebral infarction without residual deficits: Secondary | ICD-10-CM | POA: Insufficient documentation

## 2023-09-27 DIAGNOSIS — T85528A Displacement of other gastrointestinal prosthetic devices, implants and grafts, initial encounter: Secondary | ICD-10-CM | POA: Insufficient documentation

## 2023-09-27 DIAGNOSIS — Z7901 Long term (current) use of anticoagulants: Secondary | ICD-10-CM | POA: Insufficient documentation

## 2023-09-27 MED ORDER — DIATRIZOATE MEGLUMINE & SODIUM 66-10 % PO SOLN
30.0000 mL | Freq: Once | ORAL | Status: AC
  Administered 2023-09-27: 30 mL
  Filled 2023-09-27: qty 30

## 2023-09-27 NOTE — ED Notes (Signed)
 [facility] called for transport.

## 2023-09-27 NOTE — ED Triage Notes (Signed)
Patient from home via EMS after her g tube came out this morning. Bedbound from previous CVA.

## 2023-09-27 NOTE — ED Provider Notes (Signed)
[place] EMERGENCY DEPARTMENT AT [facility] Provider Note   CSN: [id number] Arrival date & time: 09/27/23  0732     History  Chief Complaint  Patient presents with   Feeding Tube dislodged    [name] is a 83 y.o. female.  83 year old female with prior medical history as detailed below presents for evaluation.  Patient with longstanding history of G-tube.  G-tube came out overnight.  Patient is bedbound after prior stroke.  Patient is presenting to the ED for replacement of her G-tube.  The history is provided by the patient and medical records.       Home Medications Prior to Admission medications   Medication Sig Start Date End Date Taking? Authorizing Provider  acetaminophen (TYLENOL) 500 MG tablet Take 1,000 mg by mouth every 6 (six) hours as needed for moderate pain or fever. Patient not taking: Reported on 03/03/2023    [provider]  albuterol (VENTOLIN HFA) 108 (90 Base) MCG/ACT inhaler Inhale 2 puffs into the lungs every 6 (six) hours as needed for wheezing or shortness of breath.    [provider]  amLODipine (NORVASC) 5 MG tablet Take 1 tablet (5 mg total) by mouth daily. 03/08/23 04/07/23  Zigmund Daniel., MD  atorvastatin (LIPITOR) 20 MG tablet Take 20 mg by mouth at bedtime. 09/15/20   [provider]  BIOTIN PO Take 1 capsule by mouth daily.    [provider]  bisacodyl (DULCOLAX) 10 MG suppository Place 10 mg rectally as needed for moderate constipation.    [provider]  Bisacodyl (FLEET LAXATIVE PO) Take 1 Dose by mouth as needed (constipation).    [provider]  busPIRone (BUSPAR) 30 MG tablet Take 15 mg by mouth 2 (two) times daily. 09/22/20   [provider]  butalbital-acetaminophen-caffeine (FIORICET) 50-325-40 MG tablet Take 0.5 tablets by mouth as needed for headache.    [provider]  cholecalciferol (VITAMIN D3) 25 MCG (1000 UNIT) tablet Take  1,000 Units by mouth daily.    [provider]  clotrimazole-betamethasone (LOTRISONE) cream Apply 1 Application topically 2 (two) times daily. Apply to buttocks    [provider]  cycloSPORINE (RESTASIS) 0.05 % ophthalmic emulsion Place 1 drop into both eyes daily as needed (dry eyes). Patient taking differently: Place 1 drop into both eyes daily as needed (for redness). 02/21/20   Layne Benton, NP  docusate sodium (COLACE) 100 MG capsule Take 200 mg by mouth at bedtime.    [provider]  doxycycline (VIBRA-TABS) 100 MG tablet Take 1 tablet (100 mg total) by mouth 2 (two) times daily. 08/29/23   Linwood Dibbles, MD  ELIQUIS 5 MG TABS tablet Take 5 mg by mouth in the morning and at bedtime. 09/26/20   [provider]  ferrous sulfate 325 (65 FE) MG tablet Take 1 tablet (325 mg total) by mouth daily with breakfast. Patient taking differently: Take 325 mg by mouth See admin instructions. Take 1 tablet by mouth every 2 to 3 days 08/06/20   Lewie Chamber, MD  fluticasone [facility]) 50 MCG/ACT nasal spray Place 2 sprays into both nostrils daily as needed for allergies or rhinitis.    [provider]  furosemide (LASIX) 20 MG tablet Take 10 mg by mouth daily as needed for fluid or edema.    [provider]  gentamicin cream (GARAMYCIN) 0.1 % Apply 1 Application topically See admin instructions. Apply around the feeding tube with every change and  every morning with wound dressing change    [provider]  linaclotide (LINZESS) 290 MCG CAPS capsule Take 290 mcg by mouth daily before breakfast.    [provider]  lisinopril (ZESTRIL) 10 MG tablet Take 2 tablets (20 mg total) by mouth daily. 03/07/23 04/06/23  Zigmund Daniel., MD  memantine (NAMENDA XR) 28 MG CP24 24 hr capsule Take 1 capsule (28 mg total) by mouth daily. 03/09/20   Angiulli, Mcarthur Rossetti, PA-C  Multiple Vitamins-Minerals (CENTRUM SILVER 50+WOMEN) TABS Take 1 tablet by mouth  in the morning.    [provider]  nitroGLYCERIN (NITROSTAT) 0.4 MG SL tablet Place 1 tablet (0.4 mg total) under the tongue every 5 (five) minutes as needed for chest pain. 09/17/19   [facility], Roe Rutherford, PA  nystatin powder Apply 1 Application topically See admin instructions. Apply to buttocks 2 times a day (after applying clotrimazole-betamethasone cream)    [provider]  OVER THE COUNTER MEDICATION 1 patch See admin instructions. Calcium Alginate with Antimicrobial Silver Sheet Dressing- Change dressing every morning (left "tunnel")    [provider]  potassium chloride (KLOR-CON) 10 MEQ tablet Take 5 mEq by mouth See admin instructions. Take 5 mEq by mouth once a day only when taking Furosemide 09/19/20   [provider]  vitamin C (ASCORBIC ACID) 500 MG tablet Take 500 mg by mouth daily.    [provider]  zinc gluconate 50 MG tablet Take 50 mg by mouth daily.    [provider]  zolpidem (AMBIEN) 5 MG tablet Take 1 tablet (5 mg total) by mouth at bedtime as needed for sleep (Insomnia). Patient not taking: No sig reported 05/30/20 08/05/20  Swayze, Ava, DO      Allergies    T-pa [alteplase], Penicillins, and Codeine    Review of Systems   Review of Systems  All other systems reviewed and are negative.   Physical Exam Updated Vital Signs BP (!) 193/83   Pulse (!) 45   Temp 97.8 F (36.6 C)   Resp 18   SpO2 98%  Physical Exam Vitals and nursing note reviewed.  Constitutional:      General: She is not in acute distress.    Appearance: She is well-developed.  HENT:     Head: Normocephalic and atraumatic.  Eyes:     Conjunctiva/sclera: Conjunctivae normal.  Cardiovascular:     Rate and Rhythm: Normal rate and regular rhythm.     Heart sounds: No murmur heard. Pulmonary:     Effort: Pulmonary effort is normal. No respiratory distress.     Breath sounds: Normal breath sounds.  Abdominal:     Palpations: Abdomen is  soft.     Tenderness: There is no abdominal tenderness.     Comments: G-tube is a 56 [place].  It is no longer in place.  New 20 French G-tube obtained and inserted without difficulty.  Patient tolerated this well.  Musculoskeletal:        General: No swelling.     Cervical back: Neck supple.  Skin:    General: Skin is warm and dry.     Capillary Refill: Capillary refill takes less than 2 seconds.  Neurological:     Mental Status: She is alert.  Psychiatric:        Mood and Affect: Mood normal.     ED Results / Procedures / Treatments   Labs (all labs ordered are listed, but only abnormal results are displayed) Labs Reviewed -  No data to display  EKG None  Radiology DG ABDOMEN PEG TUBE LOCATION Result Date: 09/27/2023 CLINICAL DATA:  83 year old female status post percutaneous gastrostomy tube placement. EXAM: ABDOMEN - 1 VIEW COMPARISON:  03/03/2023. FINDINGS: Percutaneous gastrostomy tube with tip projecting over the left upper quadrant of the abdomen. Per report, the clinical team injected 30 mL of Gastrografin into the tube. Contrast material is noted within the lumen of the stomach. No definite extravasation of contrast material confidently identified. Gas is noted throughout nondilated loops of small bowel and colon. No definite pneumoperitoneum. IVC filter projecting over the lumbar spine. IMPRESSION: 1. Percutaneous gastrostomy tube appears appropriately located. Electronically Signed   By: Trudie Reed M.D.   On: 09/27/2023 10:03    Procedures .Gastrostomy tube replacement  Date/Time: 09/27/2023 10:07 AM  Performed by: Wynetta Fines, MD Authorized by: Wynetta Fines, MD  Consent: Verbal consent obtained. Risks and benefits: risks, benefits and alternatives were discussed Consent given by: patient Site marked: the operative site was marked Patient identity confirmed: verbally with patient Local anesthesia used: no  Anesthesia: Local anesthesia used:  no  Sedation: Patient sedated: no  Patient tolerance: patient tolerated the procedure well with no immediate complications Comments: 42 French G-tube placed without difficulty       Medications Ordered in ED Medications  diatrizoate meglumine-sodium (GASTROGRAFIN) 66-10 % solution 30 mL (30 mLs Per Tube Given 09/27/23 0947)    ED Course/ Medical Decision Making/ A&P                                 Medical Decision Making Amount and/or Complexity of Data Reviewed Radiology: ordered.  Risk Prescription drug management.    Medical Screen Complete  This patient presented to the ED with complaint of G-tube replacement.  This complaint involves an extensive number of treatment options. The initial differential diagnosis includes, but is not limited to, G-tube replacement  This presentation is: Acute, Chronic, Self-Limited, and Previously Undiagnosed  Patient presents for G-tube replacement.  Her G-tube fell out overnight.  New 20 French G-tube placed without difficulty.  Patient tolerated this well.  Patient is appropriate for discharge.  Importance of close follow-up is stressed.   Additional history obtained:  External records from outside sources obtained and reviewed including prior ED visits and prior Inpatient records.    Imaging Studies ordered:   Abdominal XR I independently visualized and interpreted obtained imaging which showed G-tube placed well I agree with the radiologist interpretation.  Problem List / ED Course:  G-tube replacement   Reevaluation:  After the interventions noted above, I reevaluated the patient and found that they have: improved   Disposition:  After consideration of the diagnostic results and the patients response to treatment, I feel that the patent would benefit from close outpatient follow-up.          Final Clinical Impression(s) / ED Diagnoses Final diagnoses:  Attention to G-tube [facility])    Rx / DC  Orders ED Discharge Orders     None         Wynetta Fines, MD 09/27/23 1630

## 2023-09-27 NOTE — Discharge Instructions (Addendum)
Return for any problem.  Your G-tube was replaced.  It is the same size.  You can use it today.

## 2023-09-30 ENCOUNTER — Emergency Department (HOSPITAL_COMMUNITY)
Admission: EM | Admit: 2023-09-30 | Discharge: 2023-09-30 | Disposition: A | Discharge status: Home / Self Care | Payer: Medicare Other | Attending: Emergency Medicine | Admitting: Emergency Medicine

## 2023-09-30 ENCOUNTER — Encounter (HOSPITAL_COMMUNITY): Payer: Self-pay

## 2023-09-30 ENCOUNTER — Emergency Department (HOSPITAL_COMMUNITY): Payer: Medicare Other

## 2023-09-30 ENCOUNTER — Other Ambulatory Visit: Payer: Self-pay

## 2023-09-30 DIAGNOSIS — Z7901 Long term (current) use of anticoagulants: Secondary | ICD-10-CM | POA: Diagnosis not present

## 2023-09-30 DIAGNOSIS — Z431 Encounter for attention to gastrostomy: Secondary | ICD-10-CM

## 2023-09-30 DIAGNOSIS — K9423 Gastrostomy malfunction: Secondary | ICD-10-CM | POA: Insufficient documentation

## 2023-09-30 MED ORDER — DIATRIZOATE MEGLUMINE & SODIUM 66-10 % PO SOLN
30.0000 mL | Freq: Once | ORAL | Status: AC
  Administered 2023-09-30: 30 mL
  Filled 2023-09-30: qty 30

## 2023-09-30 NOTE — ED Provider Triage Note (Addendum)
Emergency Medicine Provider Triage Evaluation Note  [name] , a 83 y.o. female  was evaluated in triage.  Pt complains of feeding tube fell out.  Review of Systems  Positive: Feeding tube out Negative:   Physical Exam  BP (!) 218/85   Pulse (!) 52   Temp 98.2 F (36.8 C)   Resp 18   Ht 5\' 7"  (1.702 m)   Wt 77 kg   SpO2 100%   BMI 26.59 kg/m  Gen:   Awake, no distress   Resp:  Normal effort  MSK:   Moves extremities without difficulty  Other:  Feeding tube is not in place.  New 20 French feeding tube placed without difficulty.  Confirmatory x-ray ordered.  Medical Decision Making  Medically screening exam initiated at 9:54 AM.  Appropriate orders placed.  [name] was informed that the remainder of the evaluation will be completed by another provider, this initial triage assessment does not replace that evaluation, and the importance of remaining in the ED until their evaluation is complete.  Patient appropriate for discharge once feeding tube placement is confirmed by x-ray.   Wynetta Fines, MD 09/30/23 [id number]    Wynetta Fines, MD 09/30/23 [phone]

## 2023-09-30 NOTE — Discharge Instructions (Signed)
 Return for any problem.  ?

## 2023-09-30 NOTE — ED Notes (Signed)
Pt cleaned and brief replaced

## 2023-09-30 NOTE — ED Notes (Signed)
Pt returned from x-ray. G-tube dressed w/abd pad.

## 2023-09-30 NOTE — ED Notes (Signed)
Dr. Francia Greaves at bedside

## 2023-09-30 NOTE — ED Provider Notes (Signed)
[place] EMERGENCY DEPARTMENT AT [facility] Provider Note   CSN: [id number] Arrival date & time: 09/30/23  0941     History  Chief Complaint  Patient presents with   feeding tube out    [name] is a 83 y.o. female.  83 year old female with prior medical history as detailed below presents for evaluation.  Patient's feeding tube fell out this morning.  Patient was sent to the ED for replacement.  New 20 French feeding tube placed without difficulty in triage.  X-rays confirm appropriate placement.  Patient is appropriate for discharge.  The history is provided by the patient and medical records.       Home Medications Prior to Admission medications   Medication Sig Start Date End Date Taking? Authorizing Provider  acetaminophen (TYLENOL) 500 MG tablet Take 1,000 mg by mouth every 6 (six) hours as needed for moderate pain or fever. Patient not taking: Reported on 03/03/2023    [provider]  albuterol (VENTOLIN HFA) 108 (90 Base) MCG/ACT inhaler Inhale 2 puffs into the lungs every 6 (six) hours as needed for wheezing or shortness of breath.    [provider]  amLODipine (NORVASC) 5 MG tablet Take 1 tablet (5 mg total) by mouth daily. 03/08/23 04/07/23  Zigmund Daniel., MD  atorvastatin (LIPITOR) 20 MG tablet Take 20 mg by mouth at bedtime. 09/15/20   [provider]  BIOTIN PO Take 1 capsule by mouth daily.    [provider]  bisacodyl (DULCOLAX) 10 MG suppository Place 10 mg rectally as needed for moderate constipation.    [provider]  Bisacodyl (FLEET LAXATIVE PO) Take 1 Dose by mouth as needed (constipation).    [provider]  busPIRone (BUSPAR) 30 MG tablet Take 15 mg by mouth 2 (two) times daily. 09/22/20   [provider]  butalbital-acetaminophen-caffeine (FIORICET) 50-325-40 MG tablet Take 0.5 tablets by mouth as needed for headache.    [provider]  cholecalciferol  (VITAMIN D3) 25 MCG (1000 UNIT) tablet Take 1,000 Units by mouth daily.    [provider]  clotrimazole-betamethasone (LOTRISONE) cream Apply 1 Application topically 2 (two) times daily. Apply to buttocks    [provider]  cycloSPORINE (RESTASIS) 0.05 % ophthalmic emulsion Place 1 drop into both eyes daily as needed (dry eyes). Patient taking differently: Place 1 drop into both eyes daily as needed (for redness). 02/21/20   Layne Benton, NP  docusate sodium (COLACE) 100 MG capsule Take 200 mg by mouth at bedtime.    [provider]  doxycycline (VIBRA-TABS) 100 MG tablet Take 1 tablet (100 mg total) by mouth 2 (two) times daily. 08/29/23   Linwood Dibbles, MD  ELIQUIS 5 MG TABS tablet Take 5 mg by mouth in the morning and at bedtime. 09/26/20   [provider]  ferrous sulfate 325 (65 FE) MG tablet Take 1 tablet (325 mg total) by mouth daily with breakfast. Patient taking differently: Take 325 mg by mouth See admin instructions. Take 1 tablet by mouth every 2 to 3 days 08/06/20   Lewie Chamber, MD  fluticasone [facility]) 50 MCG/ACT nasal spray Place 2 sprays into both nostrils daily as needed for allergies or rhinitis.    [provider]  furosemide (LASIX) 20 MG tablet Take 10 mg by mouth daily as needed for fluid or edema.    [provider]  gentamicin cream (GARAMYCIN) 0.1 % Apply 1 Application topically See admin instructions. Apply  around the feeding tube with every change and every morning with wound dressing change    [provider]  linaclotide (LINZESS) 290 MCG CAPS capsule Take 290 mcg by mouth daily before breakfast.    [provider]  lisinopril (ZESTRIL) 10 MG tablet Take 2 tablets (20 mg total) by mouth daily. 03/07/23 04/06/23  Zigmund Daniel., MD  memantine (NAMENDA XR) 28 MG CP24 24 hr capsule Take 1 capsule (28 mg total) by mouth daily. 03/09/20   Angiulli, Mcarthur Rossetti, PA-C  Multiple Vitamins-Minerals (CENTRUM  SILVER 50+WOMEN) TABS Take 1 tablet by mouth in the morning.    [provider]  nitroGLYCERIN (NITROSTAT) 0.4 MG SL tablet Place 1 tablet (0.4 mg total) under the tongue every 5 (five) minutes as needed for chest pain. 09/17/19   [facility], Roe Rutherford, PA  nystatin powder Apply 1 Application topically See admin instructions. Apply to buttocks 2 times a day (after applying clotrimazole-betamethasone cream)    [provider]  OVER THE COUNTER MEDICATION 1 patch See admin instructions. Calcium Alginate with Antimicrobial Silver Sheet Dressing- Change dressing every morning (left "tunnel")    [provider]  potassium chloride (KLOR-CON) 10 MEQ tablet Take 5 mEq by mouth See admin instructions. Take 5 mEq by mouth once a day only when taking Furosemide 09/19/20   [provider]  vitamin C (ASCORBIC ACID) 500 MG tablet Take 500 mg by mouth daily.    [provider]  zinc gluconate 50 MG tablet Take 50 mg by mouth daily.    [provider]  zolpidem (AMBIEN) 5 MG tablet Take 1 tablet (5 mg total) by mouth at bedtime as needed for sleep (Insomnia). Patient not taking: No sig reported 05/30/20 08/05/20  Swayze, Ava, DO      Allergies    T-pa [alteplase], Penicillins, and Codeine    Review of Systems   Review of Systems  All other systems reviewed and are negative.   Physical Exam Updated Vital Signs BP (!) 218/85   Pulse (!) 52   Temp 98.2 F (36.8 C)   Resp 18   Ht 5\' 7"  (1.702 m)   Wt 77 kg   SpO2 100%   BMI 26.59 kg/m  Physical Exam Vitals and nursing note reviewed.  Constitutional:      General: She is not in acute distress.    Appearance: Normal appearance. She is well-developed.  HENT:     Head: Normocephalic and atraumatic.  Eyes:     Conjunctiva/sclera: Conjunctivae normal.     Pupils: Pupils are equal, round, and reactive to light.  Cardiovascular:     Rate and Rhythm: Normal rate and regular rhythm.     Heart sounds:  Normal heart sounds.  Pulmonary:     Effort: Pulmonary effort is normal. No respiratory distress.     Breath sounds: Normal breath sounds.  Abdominal:     General: There is no distension.     Palpations: Abdomen is soft.     Tenderness: There is no abdominal tenderness.     Comments: Site of recent G-tube is clean and dry.  New 20 French feeding tube placed without difficulty.  Patient tolerated this well.  Musculoskeletal:        General: No deformity. Normal range of motion.     Cervical back: Normal range of motion and neck supple.  Skin:    General: Skin is warm and dry.  Neurological:     General: No focal deficit  present.     Mental Status: She is alert.     ED Results / Procedures / Treatments   Labs (all labs ordered are listed, but only abnormal results are displayed) Labs Reviewed - No data to display  EKG None  Radiology No results found.  Procedures .Gastrostomy tube replacement  Date/Time: 09/30/2023 10:50 AM  Performed by: Wynetta Fines, MD Authorized by: Wynetta Fines, MD  Consent: Verbal consent obtained. Risks and benefits: risks, benefits and alternatives were discussed Consent given by: patient Patient identity confirmed: verbally with patient Time out: Immediately prior to procedure a "time out" was called to verify the correct patient, procedure, equipment, support staff and site/side marked as required. Local anesthesia used: no  Anesthesia: Local anesthesia used: no  Sedation: Patient sedated: no  Patient tolerance: patient tolerated the procedure well with no immediate complications       Medications Ordered in ED Medications  diatrizoate meglumine-sodium (GASTROGRAFIN) 66-10 % solution 30 mL (30 mLs Per Tube Given 09/30/23 1040)    ED Course/ Medical Decision Making/ A&P                                 Medical Decision Making Amount and/or Complexity of Data Reviewed Radiology: ordered.  Risk Prescription drug  management.    Medical Screen Complete  This patient presented to the ED with complaint of feeding tube out.  This complaint involves an extensive number of treatment options. The initial differential diagnosis includes, but is not limited to, replacement of G-tube   This presentation is: Acute  G-tube of patient apparently was dislodged this morning.  New 20 French G-tube placed without difficulty.  X-rays confirm appropriate positioning.  Patient is appropriate for discharge.  Co morbidities that complicated the patient's evaluation  See HPI   Additional history obtained:  Additional history obtained from EMS External records from outside sources obtained and reviewed including prior ED visits and prior Inpatient records.     Imaging Studies ordered:  I ordered imaging studies including plain film of abdomen I independently visualized and interpreted obtained imaging which showed appropriate G-tube positioning I agree with the radiologist interpretation.   Problem List / ED Course:  G-tube dislodgment   Reevaluation:  After the interventions noted above, I reevaluated the patient and found that they have: improved   Disposition:  After consideration of the diagnostic results and the patients response to treatment, I feel that the patent would benefit from close outpatient follow-up.          Final Clinical Impression(s) / ED Diagnoses Final diagnoses:  Attention to G-tube [facility])    Rx / DC Orders ED Discharge Orders     None         Wynetta Fines, MD 09/30/23 1052

## 2023-09-30 NOTE — ED Notes (Signed)
Ptar called pt to [facility]

## 2023-09-30 NOTE — ED Notes (Signed)
Called pt's son to confirm he would be home.

## 2023-09-30 NOTE — ED Triage Notes (Signed)
Pt to ED via [facility] from home. Pt's feeding tube came out this am. Pt seen recently for same. Pt denies any pain or any other complaints at this time. Feeding tube w/pt.

## 2023-10-07 ENCOUNTER — Ambulatory Visit (HOSPITAL_COMMUNITY): Payer: Medicare Other

## 2023-10-09 ENCOUNTER — Other Ambulatory Visit (HOSPITAL_COMMUNITY): Payer: Self-pay | Admitting: Radiology

## 2023-10-09 ENCOUNTER — Encounter: Payer: Self-pay | Admitting: Radiology

## 2023-10-09 ENCOUNTER — Ambulatory Visit (HOSPITAL_COMMUNITY)
Admission: RE | Admit: 2023-10-09 | Discharge: 2023-10-09 | Disposition: A | Discharge status: Home / Self Care | Payer: Medicare Other | Source: Ambulatory Visit | Attending: Radiology | Admitting: Radiology

## 2023-10-09 DIAGNOSIS — Z431 Encounter for attention to gastrostomy: Secondary | ICD-10-CM | POA: Diagnosis present

## 2023-10-09 DIAGNOSIS — Z931 Gastrostomy status: Secondary | ICD-10-CM

## 2023-10-09 DIAGNOSIS — E43 Unspecified severe protein-calorie malnutrition: Secondary | ICD-10-CM

## 2023-10-09 HISTORY — PX: IR REPLACE G-TUBE SIMPLE WO FLUORO: IMG2323

## 2023-10-09 MED ORDER — DIATRIZOATE MEGLUMINE & SODIUM 66-10 % PO SOLN
30.0000 mL | Freq: Once | ORAL | Status: AC
  Administered 2023-10-09: 30 mL
  Filled 2023-10-09: qty 30

## 2023-10-09 MED ORDER — DIATRIZOATE MEGLUMINE & SODIUM 66-10 % PO SOLN
ORAL | Status: AC
  Filled 2023-10-09: qty 30

## 2023-10-09 NOTE — Procedures (Signed)
 Patient seen  at bedside for gastrostomy tube site replacement procedure. Lat exchanged on 6.19.24. Pre existing gastrostomy was easily removed after the balloon was deflated.  20 Fr G tube removed using gentle traction. New tube inserted with out difficulty. 20 ml of normal saline instilled in the retention balloon port and the flange repositioned for comfort. Stomach contents noted in the g astrostomy tube.   Confirmation abdominal xray confirming placement pending. Once confirmed  Ok to begin using g-tube for medicines, tube feeds, free water, etc.  Please call IR for any questions or concerns regarding the g-tube.

## 2023-10-10 ENCOUNTER — Ambulatory Visit: Payer: Medicare Other

## 2023-10-10 DIAGNOSIS — I440 Atrioventricular block, first degree: Secondary | ICD-10-CM

## 2023-10-13 LAB — CUP PACEART REMOTE DEVICE CHECK
Date Time Interrogation Session: 20250228080929
Implantable Pulse Generator Implant Date: 20210712

## 2023-10-15 NOTE — Addendum Note (Signed)
 Addended by: Elease Etienne A on: 10/15/2023 09:48 AM   Modules accepted: Orders

## 2023-10-15 NOTE — Progress Notes (Signed)
 [facility] Summary Report / Loop Recorder

## 2023-10-22 ENCOUNTER — Encounter (HOSPITAL_BASED_OUTPATIENT_CLINIC_OR_DEPARTMENT_OTHER): Payer: Medicare Other | Admitting: Internal Medicine

## 2023-11-03 ENCOUNTER — Telehealth: Payer: Self-pay | Admitting: Cardiology

## 2023-11-03 NOTE — Telephone Encounter (Signed)
 Returned call,   HR 48, no symptoms, bed bound. Son stated there was previous discussion around need for pacemaker.   Has not dropped any lower than this, son monitors daily, HH just calling to make [place] aware.

## 2023-11-03 NOTE — Telephone Encounter (Signed)
 STAT if HR is under 50 or over 120 (normal HR is 60-100 beats per minute)  What is your heart rate? 48  sat (Home Health wanted to make sure we were aware)  Do you have a log of your heart rate readings (document readings)? no  Do you have any other symptoms? No

## 2023-11-07 NOTE — Addendum Note (Signed)
 Addended by: Elease Etienne A on: 11/07/2023 02:25 PM   Modules accepted: Orders

## 2023-11-07 NOTE — Progress Notes (Signed)
 [facility] Summary Report / Loop Recorder

## 2023-11-14 ENCOUNTER — Ambulatory Visit (INDEPENDENT_AMBULATORY_CARE_PROVIDER_SITE_OTHER): Payer: Medicare Other

## 2023-11-14 DIAGNOSIS — I440 Atrioventricular block, first degree: Secondary | ICD-10-CM

## 2023-11-15 LAB — CUP PACEART REMOTE DEVICE CHECK
Date Time Interrogation Session: 20250404080913
Implantable Pulse Generator Implant Date: 20210712

## 2023-11-19 ENCOUNTER — Ambulatory Visit (HOSPITAL_BASED_OUTPATIENT_CLINIC_OR_DEPARTMENT_OTHER): Admitting: General Surgery

## 2023-11-26 ENCOUNTER — Encounter (HOSPITAL_BASED_OUTPATIENT_CLINIC_OR_DEPARTMENT_OTHER): Attending: General Surgery | Admitting: General Surgery

## 2023-11-26 DIAGNOSIS — L89103 Pressure ulcer of unspecified part of back, stage 3: Secondary | ICD-10-CM | POA: Insufficient documentation

## 2023-11-26 DIAGNOSIS — Z8673 Personal history of transient ischemic attack (TIA), and cerebral infarction without residual deficits: Secondary | ICD-10-CM | POA: Diagnosis not present

## 2023-11-26 DIAGNOSIS — L304 Erythema intertrigo: Secondary | ICD-10-CM | POA: Diagnosis not present

## 2023-11-26 DIAGNOSIS — Z7901 Long term (current) use of anticoagulants: Secondary | ICD-10-CM | POA: Diagnosis not present

## 2023-11-26 DIAGNOSIS — Z95828 Presence of other vascular implants and grafts: Secondary | ICD-10-CM | POA: Insufficient documentation

## 2023-11-26 DIAGNOSIS — Z86718 Personal history of other venous thrombosis and embolism: Secondary | ICD-10-CM | POA: Insufficient documentation

## 2023-12-19 ENCOUNTER — Ambulatory Visit (INDEPENDENT_AMBULATORY_CARE_PROVIDER_SITE_OTHER): Payer: Medicare Other

## 2023-12-19 DIAGNOSIS — I440 Atrioventricular block, first degree: Secondary | ICD-10-CM

## 2023-12-22 NOTE — Progress Notes (Signed)
 [facility] Summary Report / Loop Recorder

## 2023-12-22 NOTE — Addendum Note (Signed)
 Addended by: Lott Rouleau A on: 12/22/2023 11:22 AM   Modules accepted: Orders

## 2023-12-23 LAB — CUP PACEART REMOTE DEVICE CHECK
Date Time Interrogation Session: 20250509080934
Implantable Pulse Generator Implant Date: 20210712

## 2023-12-31 ENCOUNTER — Encounter (HOSPITAL_BASED_OUTPATIENT_CLINIC_OR_DEPARTMENT_OTHER): Admitting: General Surgery

## 2024-01-07 ENCOUNTER — Ambulatory Visit: Payer: Self-pay | Admitting: Cardiovascular Disease

## 2024-01-16 ENCOUNTER — Ambulatory Visit (HOSPITAL_BASED_OUTPATIENT_CLINIC_OR_DEPARTMENT_OTHER): Admitting: General Surgery

## 2024-01-19 ENCOUNTER — Encounter (HOSPITAL_BASED_OUTPATIENT_CLINIC_OR_DEPARTMENT_OTHER): Attending: General Surgery | Admitting: General Surgery

## 2024-01-19 DIAGNOSIS — L89103 Pressure ulcer of unspecified part of back, stage 3: Secondary | ICD-10-CM | POA: Insufficient documentation

## 2024-01-20 LAB — CUP PACEART REMOTE DEVICE CHECK
Date Time Interrogation Session: 20250609081330
Implantable Pulse Generator Implant Date: 20210712

## 2024-01-23 ENCOUNTER — Ambulatory Visit: Payer: Medicare Other

## 2024-01-23 DIAGNOSIS — I440 Atrioventricular block, first degree: Secondary | ICD-10-CM | POA: Diagnosis not present

## 2024-01-23 NOTE — Progress Notes (Signed)
 [facility] Summary Report / Loop Recorder

## 2024-01-23 NOTE — Addendum Note (Signed)
 Addended by: Lott Rouleau A on: 01/23/2024 10:53 AM   Modules accepted: Orders

## 2024-02-06 ENCOUNTER — Ambulatory Visit: Payer: Self-pay | Admitting: Cardiology

## 2024-02-18 ENCOUNTER — Encounter (HOSPITAL_BASED_OUTPATIENT_CLINIC_OR_DEPARTMENT_OTHER): Attending: General Surgery | Admitting: General Surgery

## 2024-02-23 ENCOUNTER — Ambulatory Visit (INDEPENDENT_AMBULATORY_CARE_PROVIDER_SITE_OTHER)

## 2024-02-23 DIAGNOSIS — I440 Atrioventricular block, first degree: Secondary | ICD-10-CM

## 2024-02-23 LAB — CUP PACEART REMOTE DEVICE CHECK
Date Time Interrogation Session: 20250714004237
Implantable Pulse Generator Implant Date: 20210712

## 2024-03-11 NOTE — Progress Notes (Signed)
 [facility] Summary Report / Loop Recorder

## 2024-03-24 ENCOUNTER — Encounter (HOSPITAL_BASED_OUTPATIENT_CLINIC_OR_DEPARTMENT_OTHER): Attending: General Surgery | Admitting: General Surgery

## 2024-03-24 DIAGNOSIS — L97129 Non-pressure chronic ulcer of left thigh with unspecified severity: Secondary | ICD-10-CM | POA: Insufficient documentation

## 2024-03-24 DIAGNOSIS — L89103 Pressure ulcer of unspecified part of back, stage 3: Secondary | ICD-10-CM | POA: Insufficient documentation

## 2024-03-25 ENCOUNTER — Ambulatory Visit (INDEPENDENT_AMBULATORY_CARE_PROVIDER_SITE_OTHER)

## 2024-03-25 DIAGNOSIS — I440 Atrioventricular block, first degree: Secondary | ICD-10-CM | POA: Diagnosis not present

## 2024-03-25 LAB — CUP PACEART REMOTE DEVICE CHECK
Date Time Interrogation Session: 20250813234219
Implantable Pulse Generator Implant Date: 20210712

## 2024-04-07 ENCOUNTER — Other Ambulatory Visit (HOSPITAL_COMMUNITY): Payer: Self-pay | Admitting: Radiology

## 2024-04-07 ENCOUNTER — Ambulatory Visit (HOSPITAL_COMMUNITY)
Admission: RE | Admit: 2024-04-07 | Discharge: 2024-04-07 | Disposition: A | Discharge status: Home / Self Care | Payer: Medicare Other | Source: Ambulatory Visit | Attending: Radiology | Admitting: Radiology

## 2024-04-07 DIAGNOSIS — Z931 Gastrostomy status: Secondary | ICD-10-CM

## 2024-04-07 DIAGNOSIS — Z431 Encounter for attention to gastrostomy: Secondary | ICD-10-CM | POA: Insufficient documentation

## 2024-04-07 HISTORY — PX: IR REPLC GASTRO/COLONIC TUBE PERCUT W/FLUORO: IMG2333

## 2024-04-07 MED ORDER — IOHEXOL 300 MG/ML  SOLN
50.0000 mL | Freq: Once | INTRAMUSCULAR | Status: AC | PRN
  Administered 2024-04-07: 10 mL

## 2024-04-21 ENCOUNTER — Encounter (HOSPITAL_BASED_OUTPATIENT_CLINIC_OR_DEPARTMENT_OTHER): Admitting: General Surgery

## 2024-04-24 LAB — CUP PACEART REMOTE DEVICE CHECK
Date Time Interrogation Session: 20250913233301
Implantable Pulse Generator Implant Date: 20210712

## 2024-04-26 ENCOUNTER — Ambulatory Visit (INDEPENDENT_AMBULATORY_CARE_PROVIDER_SITE_OTHER)

## 2024-04-26 DIAGNOSIS — I440 Atrioventricular block, first degree: Secondary | ICD-10-CM

## 2024-04-30 NOTE — Progress Notes (Signed)
 Remote Loop Recorder Transmission

## 2024-05-05 ENCOUNTER — Ambulatory Visit (HOSPITAL_BASED_OUTPATIENT_CLINIC_OR_DEPARTMENT_OTHER): Admitting: General Surgery

## 2024-05-05 ENCOUNTER — Encounter (HOSPITAL_BASED_OUTPATIENT_CLINIC_OR_DEPARTMENT_OTHER): Payer: Self-pay

## 2024-05-05 NOTE — Progress Notes (Signed)
 Remote Loop Recorder Transmission

## 2024-05-14 ENCOUNTER — Ambulatory Visit: Payer: Self-pay | Admitting: Student in an Organized Health Care Education/Training Program

## 2024-05-20 NOTE — Progress Notes (Signed)
 Remote Loop Recorder Transmission

## 2024-05-25 ENCOUNTER — Encounter (HOSPITAL_BASED_OUTPATIENT_CLINIC_OR_DEPARTMENT_OTHER): Attending: General Surgery | Admitting: General Surgery

## 2024-05-25 ENCOUNTER — Ambulatory Visit

## 2024-05-25 DIAGNOSIS — L304 Erythema intertrigo: Secondary | ICD-10-CM | POA: Diagnosis present

## 2024-05-25 DIAGNOSIS — L89103 Pressure ulcer of unspecified part of back, stage 3: Secondary | ICD-10-CM | POA: Insufficient documentation

## 2024-05-25 DIAGNOSIS — I440 Atrioventricular block, first degree: Secondary | ICD-10-CM | POA: Diagnosis not present

## 2024-05-25 LAB — CUP PACEART REMOTE DEVICE CHECK
Date Time Interrogation Session: 20251013233502
Implantable Pulse Generator Implant Date: 20210712

## 2024-05-26 NOTE — Progress Notes (Signed)
 Remote Loop Recorder Transmission

## 2024-05-27 ENCOUNTER — Encounter

## 2024-06-06 ENCOUNTER — Ambulatory Visit: Payer: Self-pay | Admitting: Student in an Organized Health Care Education/Training Program

## 2024-06-21 ENCOUNTER — Ambulatory Visit: Payer: Self-pay | Admitting: Cardiology

## 2024-06-23 ENCOUNTER — Encounter (HOSPITAL_BASED_OUTPATIENT_CLINIC_OR_DEPARTMENT_OTHER): Admitting: General Surgery

## 2024-06-25 ENCOUNTER — Ambulatory Visit (INDEPENDENT_AMBULATORY_CARE_PROVIDER_SITE_OTHER)

## 2024-06-25 DIAGNOSIS — I440 Atrioventricular block, first degree: Secondary | ICD-10-CM

## 2024-06-25 LAB — CUP PACEART REMOTE DEVICE CHECK
Date Time Interrogation Session: 20251113233016
Implantable Pulse Generator Implant Date: 20210712

## 2024-06-28 ENCOUNTER — Encounter

## 2024-06-29 NOTE — Progress Notes (Signed)
 Remote Loop Recorder Transmission

## 2024-07-20 ENCOUNTER — Ambulatory Visit: Payer: Self-pay | Admitting: Student in an Organized Health Care Education/Training Program

## 2024-07-26 ENCOUNTER — Ambulatory Visit

## 2024-07-26 DIAGNOSIS — I440 Atrioventricular block, first degree: Secondary | ICD-10-CM | POA: Diagnosis not present

## 2024-07-27 LAB — CUP PACEART REMOTE DEVICE CHECK
Date Time Interrogation Session: 20251214234457
Implantable Pulse Generator Implant Date: 20210712

## 2024-07-28 ENCOUNTER — Encounter (HOSPITAL_BASED_OUTPATIENT_CLINIC_OR_DEPARTMENT_OTHER): Attending: General Surgery | Admitting: General Surgery

## 2024-07-28 DIAGNOSIS — Z8679 Personal history of other diseases of the circulatory system: Secondary | ICD-10-CM | POA: Diagnosis not present

## 2024-07-28 DIAGNOSIS — Z8673 Personal history of transient ischemic attack (TIA), and cerebral infarction without residual deficits: Secondary | ICD-10-CM | POA: Insufficient documentation

## 2024-07-28 DIAGNOSIS — L304 Erythema intertrigo: Secondary | ICD-10-CM | POA: Insufficient documentation

## 2024-07-28 DIAGNOSIS — L89103 Pressure ulcer of unspecified part of back, stage 3: Secondary | ICD-10-CM | POA: Diagnosis present

## 2024-07-28 DIAGNOSIS — Z95828 Presence of other vascular implants and grafts: Secondary | ICD-10-CM | POA: Insufficient documentation

## 2024-07-28 DIAGNOSIS — Z7901 Long term (current) use of anticoagulants: Secondary | ICD-10-CM | POA: Diagnosis not present

## 2024-07-28 DIAGNOSIS — I428 Other cardiomyopathies: Secondary | ICD-10-CM | POA: Diagnosis not present

## 2024-07-29 ENCOUNTER — Encounter

## 2024-08-06 ENCOUNTER — Ambulatory Visit: Payer: Self-pay | Admitting: Student in an Organized Health Care Education/Training Program

## 2024-08-26 ENCOUNTER — Ambulatory Visit

## 2024-08-26 DIAGNOSIS — I440 Atrioventricular block, first degree: Secondary | ICD-10-CM | POA: Diagnosis not present

## 2024-08-26 LAB — CUP PACEART REMOTE DEVICE CHECK
Date Time Interrogation Session: 20260114233845
Implantable Pulse Generator Implant Date: 20210712

## 2024-08-29 ENCOUNTER — Ambulatory Visit: Payer: Self-pay | Admitting: Student in an Organized Health Care Education/Training Program

## 2024-08-30 ENCOUNTER — Encounter

## 2024-09-03 NOTE — Progress Notes (Signed)
 Remote Loop Recorder Transmission

## 2024-09-08 ENCOUNTER — Encounter (HOSPITAL_BASED_OUTPATIENT_CLINIC_OR_DEPARTMENT_OTHER): Attending: General Surgery | Admitting: General Surgery

## 2024-09-08 DIAGNOSIS — F0150 Vascular dementia without behavioral disturbance: Secondary | ICD-10-CM | POA: Insufficient documentation

## 2024-09-08 DIAGNOSIS — Z86718 Personal history of other venous thrombosis and embolism: Secondary | ICD-10-CM | POA: Insufficient documentation

## 2024-09-08 DIAGNOSIS — L98419 Non-pressure chronic ulcer of buttock with unspecified severity: Secondary | ICD-10-CM | POA: Diagnosis not present

## 2024-09-08 DIAGNOSIS — Z86711 Personal history of pulmonary embolism: Secondary | ICD-10-CM | POA: Diagnosis not present

## 2024-09-08 DIAGNOSIS — Z7901 Long term (current) use of anticoagulants: Secondary | ICD-10-CM | POA: Insufficient documentation

## 2024-09-08 DIAGNOSIS — L304 Erythema intertrigo: Secondary | ICD-10-CM | POA: Diagnosis not present

## 2024-09-08 DIAGNOSIS — L89103 Pressure ulcer of unspecified part of back, stage 3: Secondary | ICD-10-CM | POA: Insufficient documentation

## 2024-09-26 ENCOUNTER — Encounter

## 2024-09-30 ENCOUNTER — Encounter

## 2024-10-06 ENCOUNTER — Other Ambulatory Visit (HOSPITAL_COMMUNITY)

## 2024-10-13 ENCOUNTER — Encounter (HOSPITAL_BASED_OUTPATIENT_CLINIC_OR_DEPARTMENT_OTHER): Admitting: General Surgery

## 2024-10-27 ENCOUNTER — Encounter

## 2024-11-01 ENCOUNTER — Encounter

## 2024-11-27 ENCOUNTER — Encounter

## 2024-12-02 ENCOUNTER — Encounter

## 2025-02-03 ENCOUNTER — Encounter

## 2025-03-07 ENCOUNTER — Encounter
